# Patient Record
Sex: Male | Born: 1935 | Race: White | Hispanic: No | Marital: Married | Smoking: Former smoker
Health system: Southern US, Community
[De-identification: ages and names within clinical notes are randomized; demographics above are authoritative.]

## PROBLEM LIST (undated history)

## (undated) DIAGNOSIS — S61219A Laceration without foreign body of unspecified finger without damage to nail, initial encounter: Secondary | ICD-10-CM

## (undated) DIAGNOSIS — I2510 Atherosclerotic heart disease of native coronary artery without angina pectoris: Secondary | ICD-10-CM

## (undated) DIAGNOSIS — E041 Nontoxic single thyroid nodule: Secondary | ICD-10-CM

## (undated) DIAGNOSIS — D693 Immune thrombocytopenic purpura: Secondary | ICD-10-CM

## (undated) DIAGNOSIS — K5790 Diverticulosis of intestine, part unspecified, without perforation or abscess without bleeding: Secondary | ICD-10-CM

## (undated) DIAGNOSIS — J90 Pleural effusion, not elsewhere classified: Secondary | ICD-10-CM

## (undated) DIAGNOSIS — I509 Heart failure, unspecified: Secondary | ICD-10-CM

## (undated) DIAGNOSIS — R0600 Dyspnea, unspecified: Secondary | ICD-10-CM

## (undated) DIAGNOSIS — D696 Thrombocytopenia, unspecified: Secondary | ICD-10-CM

## (undated) DIAGNOSIS — D126 Benign neoplasm of colon, unspecified: Secondary | ICD-10-CM

## (undated) DIAGNOSIS — D509 Iron deficiency anemia, unspecified: Principal | ICD-10-CM

## (undated) DIAGNOSIS — D539 Nutritional anemia, unspecified: Secondary | ICD-10-CM

## (undated) DIAGNOSIS — I82409 Acute embolism and thrombosis of unspecified deep veins of unspecified lower extremity: Secondary | ICD-10-CM

## (undated) DIAGNOSIS — R509 Fever, unspecified: Secondary | ICD-10-CM

## (undated) DIAGNOSIS — I739 Peripheral vascular disease, unspecified: Secondary | ICD-10-CM

## (undated) DIAGNOSIS — R05 Cough: Principal | ICD-10-CM

## (undated) DIAGNOSIS — E119 Type 2 diabetes mellitus without complications: Secondary | ICD-10-CM

## (undated) DIAGNOSIS — C61 Malignant neoplasm of prostate: Secondary | ICD-10-CM

## (undated) DIAGNOSIS — N289 Disorder of kidney and ureter, unspecified: Secondary | ICD-10-CM

## (undated) DIAGNOSIS — E785 Hyperlipidemia, unspecified: Secondary | ICD-10-CM

## (undated) DIAGNOSIS — C8590 Non-Hodgkin lymphoma, unspecified, unspecified site: Secondary | ICD-10-CM

## (undated) DIAGNOSIS — D72821 Monocytosis (symptomatic): Secondary | ICD-10-CM

## (undated) DIAGNOSIS — I10 Essential (primary) hypertension: Secondary | ICD-10-CM

## (undated) DIAGNOSIS — I639 Cerebral infarction, unspecified: Secondary | ICD-10-CM

## (undated) HISTORY — DX: Benign neoplasm of colon, unspecified: D12.6

## (undated) HISTORY — DX: Non-Hodgkin lymphoma, unspecified, unspecified site: C85.90

## (undated) HISTORY — DX: Peripheral vascular disease, unspecified: I73.9

## (undated) HISTORY — DX: Monocytosis (symptomatic): D72.821

## (undated) HISTORY — PX: CORONARY ANGIOPLASTY: SHX604

## (undated) HISTORY — DX: Nutritional anemia, unspecified: D53.9

## (undated) HISTORY — DX: Pleural effusion, not elsewhere classified: J90

## (undated) HISTORY — PX: CYSTOURETHROSCOPY: SHX476

## (undated) HISTORY — DX: Acute embolism and thrombosis of unspecified deep veins of unspecified lower extremity: I82.409

## (undated) HISTORY — DX: Cough: R05

## (undated) HISTORY — DX: Iron deficiency anemia, unspecified: D50.9

## (undated) HISTORY — DX: Diverticulosis of intestine, part unspecified, without perforation or abscess without bleeding: K57.90

## (undated) HISTORY — DX: Nontoxic single thyroid nodule: E04.1

## (undated) HISTORY — PX: EXPLORATORY LAPAROTOMY: SUR591

## (undated) HISTORY — DX: Essential (primary) hypertension: I10

## (undated) HISTORY — DX: Atherosclerotic heart disease of native coronary artery without angina pectoris: I25.10

## (undated) HISTORY — PX: APPENDECTOMY: SHX54

## (undated) HISTORY — DX: Cerebral infarction, unspecified: I63.9

## (undated) HISTORY — DX: Hyperlipidemia, unspecified: E78.5

## (undated) HISTORY — PX: CATARACT EXTRACTION: SUR2

## (undated) HISTORY — PX: CARDIAC CATHETERIZATION: SHX172

## (undated) HISTORY — DX: Thrombocytopenia, unspecified: D69.6

## (undated) HISTORY — DX: Type 2 diabetes mellitus without complications: E11.9

## (undated) HISTORY — DX: Malignant neoplasm of prostate: C61

## (undated) HISTORY — DX: Disorder of kidney and ureter, unspecified: N28.9

## (undated) HISTORY — PX: COLONOSCOPY: SHX174

---

## 1998-10-05 ENCOUNTER — Emergency Department (HOSPITAL_COMMUNITY): Admission: EM | Admit: 1998-10-05 | Discharge: 1998-10-05 | Payer: Self-pay | Admitting: Emergency Medicine

## 1998-10-06 ENCOUNTER — Encounter: Payer: Self-pay | Admitting: Emergency Medicine

## 1998-11-12 ENCOUNTER — Ambulatory Visit (HOSPITAL_COMMUNITY): Admission: RE | Admit: 1998-11-12 | Discharge: 1998-11-12 | Payer: Self-pay | Admitting: Cardiology

## 1998-12-02 ENCOUNTER — Encounter: Payer: Self-pay | Admitting: Cardiology

## 1998-12-02 ENCOUNTER — Ambulatory Visit (HOSPITAL_COMMUNITY): Admission: RE | Admit: 1998-12-02 | Discharge: 1998-12-02 | Payer: Self-pay | Admitting: Cardiology

## 1998-12-08 ENCOUNTER — Ambulatory Visit (HOSPITAL_COMMUNITY): Admission: RE | Admit: 1998-12-08 | Discharge: 1998-12-08 | Payer: Self-pay | Admitting: Surgery

## 1998-12-08 ENCOUNTER — Encounter: Payer: Self-pay | Admitting: Surgery

## 1999-01-12 ENCOUNTER — Inpatient Hospital Stay (HOSPITAL_COMMUNITY): Admission: RE | Admit: 1999-01-12 | Discharge: 1999-01-20 | Payer: Self-pay | Admitting: Surgery

## 1999-02-02 ENCOUNTER — Ambulatory Visit (HOSPITAL_COMMUNITY): Admission: RE | Admit: 1999-02-02 | Discharge: 1999-02-02 | Payer: Self-pay | Admitting: Oncology

## 1999-02-03 ENCOUNTER — Encounter: Payer: Self-pay | Admitting: Oncology

## 1999-02-03 ENCOUNTER — Ambulatory Visit (HOSPITAL_COMMUNITY): Admission: RE | Admit: 1999-02-03 | Discharge: 1999-02-03 | Payer: Self-pay | Admitting: Oncology

## 1999-03-11 ENCOUNTER — Encounter: Payer: Self-pay | Admitting: Oncology

## 1999-03-11 ENCOUNTER — Inpatient Hospital Stay (HOSPITAL_COMMUNITY): Admission: EM | Admit: 1999-03-11 | Discharge: 1999-03-15 | Payer: Self-pay | Admitting: Emergency Medicine

## 1999-04-01 ENCOUNTER — Encounter: Admission: RE | Admit: 1999-04-01 | Discharge: 1999-04-01 | Payer: Self-pay | Admitting: Oncology

## 1999-06-19 ENCOUNTER — Ambulatory Visit (HOSPITAL_COMMUNITY): Admission: RE | Admit: 1999-06-19 | Discharge: 1999-06-19 | Payer: Self-pay | Admitting: Oncology

## 1999-06-21 ENCOUNTER — Ambulatory Visit (HOSPITAL_COMMUNITY): Admission: RE | Admit: 1999-06-21 | Discharge: 1999-06-21 | Payer: Self-pay | Admitting: Oncology

## 1999-06-25 ENCOUNTER — Encounter: Payer: Self-pay | Admitting: Oncology

## 1999-06-25 ENCOUNTER — Encounter: Admission: RE | Admit: 1999-06-25 | Discharge: 1999-06-25 | Payer: Self-pay | Admitting: Oncology

## 1999-09-08 ENCOUNTER — Encounter: Admission: RE | Admit: 1999-09-08 | Discharge: 1999-09-08 | Payer: Self-pay | Admitting: Oncology

## 1999-09-08 ENCOUNTER — Encounter: Payer: Self-pay | Admitting: Oncology

## 1999-12-08 ENCOUNTER — Encounter: Payer: Self-pay | Admitting: Oncology

## 1999-12-08 ENCOUNTER — Encounter: Admission: RE | Admit: 1999-12-08 | Discharge: 1999-12-08 | Payer: Self-pay | Admitting: Oncology

## 1999-12-24 ENCOUNTER — Encounter: Admission: RE | Admit: 1999-12-24 | Discharge: 2000-01-01 | Payer: Self-pay | Admitting: Oncology

## 1999-12-24 ENCOUNTER — Ambulatory Visit (HOSPITAL_COMMUNITY): Admission: RE | Admit: 1999-12-24 | Discharge: 1999-12-24 | Payer: Self-pay | Admitting: Oncology

## 2000-03-15 ENCOUNTER — Encounter: Admission: RE | Admit: 2000-03-15 | Discharge: 2000-03-15 | Payer: Self-pay | Admitting: Oncology

## 2000-03-15 ENCOUNTER — Encounter: Payer: Self-pay | Admitting: Oncology

## 2000-05-06 ENCOUNTER — Encounter: Payer: Self-pay | Admitting: Oncology

## 2000-05-06 ENCOUNTER — Encounter: Admission: RE | Admit: 2000-05-06 | Discharge: 2000-05-06 | Payer: Self-pay | Admitting: Oncology

## 2000-08-24 ENCOUNTER — Encounter: Admission: RE | Admit: 2000-08-24 | Discharge: 2000-08-24 | Payer: Self-pay | Admitting: Oncology

## 2000-08-24 ENCOUNTER — Encounter: Payer: Self-pay | Admitting: Oncology

## 2001-01-11 ENCOUNTER — Encounter: Payer: Self-pay | Admitting: Oncology

## 2001-01-11 ENCOUNTER — Encounter: Admission: RE | Admit: 2001-01-11 | Discharge: 2001-01-11 | Payer: Self-pay | Admitting: Oncology

## 2001-07-13 ENCOUNTER — Encounter: Payer: Self-pay | Admitting: Oncology

## 2001-07-13 ENCOUNTER — Encounter: Admission: RE | Admit: 2001-07-13 | Discharge: 2001-07-13 | Payer: Self-pay | Admitting: Oncology

## 2002-01-19 ENCOUNTER — Encounter: Payer: Self-pay | Admitting: Oncology

## 2002-01-19 ENCOUNTER — Encounter: Admission: RE | Admit: 2002-01-19 | Discharge: 2002-01-19 | Payer: Self-pay | Admitting: *Deleted

## 2002-07-13 ENCOUNTER — Encounter: Admission: RE | Admit: 2002-07-13 | Discharge: 2002-07-13 | Payer: Self-pay | Admitting: Oncology

## 2002-07-13 ENCOUNTER — Encounter: Payer: Self-pay | Admitting: Oncology

## 2002-07-17 ENCOUNTER — Ambulatory Visit (HOSPITAL_BASED_OUTPATIENT_CLINIC_OR_DEPARTMENT_OTHER): Admission: RE | Admit: 2002-07-17 | Discharge: 2002-07-17 | Payer: Self-pay | Admitting: Orthopedic Surgery

## 2002-07-17 ENCOUNTER — Encounter (INDEPENDENT_AMBULATORY_CARE_PROVIDER_SITE_OTHER): Payer: Self-pay | Admitting: *Deleted

## 2003-01-15 ENCOUNTER — Encounter: Payer: Self-pay | Admitting: Oncology

## 2003-01-15 ENCOUNTER — Encounter: Admission: RE | Admit: 2003-01-15 | Discharge: 2003-01-15 | Payer: Self-pay | Admitting: Oncology

## 2003-02-15 ENCOUNTER — Ambulatory Visit (HOSPITAL_COMMUNITY): Admission: RE | Admit: 2003-02-15 | Discharge: 2003-02-15 | Payer: Self-pay | Admitting: Cardiology

## 2003-03-29 ENCOUNTER — Ambulatory Visit (HOSPITAL_COMMUNITY): Admission: RE | Admit: 2003-03-29 | Discharge: 2003-03-30 | Payer: Self-pay | Admitting: Cardiology

## 2003-03-29 ENCOUNTER — Encounter: Payer: Self-pay | Admitting: Cardiology

## 2003-06-22 LAB — HM COLONOSCOPY

## 2003-07-17 ENCOUNTER — Encounter: Admission: RE | Admit: 2003-07-17 | Discharge: 2003-07-17 | Payer: Self-pay | Admitting: Oncology

## 2003-11-27 ENCOUNTER — Encounter: Admission: RE | Admit: 2003-11-27 | Discharge: 2003-11-27 | Payer: Self-pay | Admitting: Family Medicine

## 2005-01-14 ENCOUNTER — Ambulatory Visit: Payer: Self-pay | Admitting: Cardiology

## 2005-01-22 ENCOUNTER — Ambulatory Visit: Payer: Self-pay | Admitting: Oncology

## 2005-01-25 ENCOUNTER — Ambulatory Visit (HOSPITAL_COMMUNITY): Admission: RE | Admit: 2005-01-25 | Discharge: 2005-01-25 | Payer: Self-pay | Admitting: Oncology

## 2005-10-06 ENCOUNTER — Ambulatory Visit (HOSPITAL_COMMUNITY): Admission: RE | Admit: 2005-10-06 | Discharge: 2005-10-06 | Payer: Self-pay | Admitting: Urology

## 2005-10-19 ENCOUNTER — Ambulatory Visit: Admission: RE | Admit: 2005-10-19 | Discharge: 2006-01-17 | Payer: Self-pay | Admitting: Radiation Oncology

## 2005-10-28 ENCOUNTER — Encounter: Admission: RE | Admit: 2005-10-28 | Discharge: 2005-10-28 | Payer: Self-pay | Admitting: Urology

## 2005-12-15 ENCOUNTER — Ambulatory Visit (HOSPITAL_BASED_OUTPATIENT_CLINIC_OR_DEPARTMENT_OTHER): Admission: RE | Admit: 2005-12-15 | Discharge: 2005-12-15 | Payer: Self-pay | Admitting: Urology

## 2006-01-26 ENCOUNTER — Ambulatory Visit: Payer: Self-pay | Admitting: Oncology

## 2006-01-28 LAB — COMPREHENSIVE METABOLIC PANEL
Albumin: 4.1 g/dL (ref 3.5–5.2)
BUN: 22 mg/dL (ref 6–23)
CO2: 22 mEq/L (ref 19–32)
Glucose, Bld: 88 mg/dL (ref 70–99)
Sodium: 140 mEq/L (ref 135–145)
Total Bilirubin: 0.4 mg/dL (ref 0.3–1.2)
Total Protein: 6.4 g/dL (ref 6.0–8.3)

## 2006-01-28 LAB — CBC WITH DIFFERENTIAL/PLATELET
Basophils Absolute: 0 10*3/uL (ref 0.0–0.1)
EOS%: 1.3 % (ref 0.0–7.0)
Eosinophils Absolute: 0.1 10*3/uL (ref 0.0–0.5)
HGB: 13.4 g/dL (ref 13.0–17.1)
LYMPH%: 34.7 % (ref 14.0–48.0)
MCH: 32.7 pg (ref 28.0–33.4)
MCV: 94.9 fL (ref 81.6–98.0)
MONO%: 8.3 % (ref 0.0–13.0)
NEUT#: 4.1 10*3/uL (ref 1.5–6.5)
Platelets: 134 10*3/uL — ABNORMAL LOW (ref 145–400)
RBC: 4.09 10*6/uL — ABNORMAL LOW (ref 4.20–5.71)

## 2006-01-28 LAB — LACTATE DEHYDROGENASE: LDH: 189 U/L (ref 94–250)

## 2006-07-07 ENCOUNTER — Ambulatory Visit: Payer: Self-pay | Admitting: Cardiology

## 2007-01-25 ENCOUNTER — Ambulatory Visit: Payer: Self-pay | Admitting: Oncology

## 2007-01-27 ENCOUNTER — Ambulatory Visit (HOSPITAL_COMMUNITY): Admission: RE | Admit: 2007-01-27 | Discharge: 2007-01-27 | Payer: Self-pay | Admitting: Oncology

## 2007-01-27 LAB — COMPREHENSIVE METABOLIC PANEL
ALT: 13 U/L (ref 0–53)
BUN: 20 mg/dL (ref 6–23)
CO2: 24 mEq/L (ref 19–32)
Calcium: 8.8 mg/dL (ref 8.4–10.5)
Chloride: 104 mEq/L (ref 96–112)
Creatinine, Ser: 1.7 mg/dL — ABNORMAL HIGH (ref 0.40–1.50)
Glucose, Bld: 178 mg/dL — ABNORMAL HIGH (ref 70–99)

## 2007-01-27 LAB — MORPHOLOGY
PLT EST: ADEQUATE
RBC Comments: NORMAL

## 2007-01-27 LAB — CBC WITH DIFFERENTIAL/PLATELET
BASO%: 1 % (ref 0.0–2.0)
HCT: 36.8 % — ABNORMAL LOW (ref 38.7–49.9)
MCHC: 35.2 g/dL (ref 32.0–35.9)
MONO#: 0.6 10*3/uL (ref 0.1–0.9)
NEUT#: 3.5 10*3/uL (ref 1.5–6.5)
RBC: 3.85 10*6/uL — ABNORMAL LOW (ref 4.20–5.71)
WBC: 6.7 10*3/uL (ref 4.0–10.0)
lymph#: 2.4 10*3/uL (ref 0.9–3.3)

## 2007-01-27 LAB — PSA: PSA: 0.84 ng/mL (ref 0.10–4.00)

## 2007-01-27 LAB — LACTATE DEHYDROGENASE: LDH: 179 U/L (ref 94–250)

## 2008-01-25 ENCOUNTER — Ambulatory Visit: Payer: Self-pay | Admitting: Oncology

## 2008-01-29 ENCOUNTER — Ambulatory Visit (HOSPITAL_COMMUNITY): Admission: RE | Admit: 2008-01-29 | Discharge: 2008-01-29 | Payer: Self-pay | Admitting: Oncology

## 2008-01-29 LAB — MORPHOLOGY: PLT EST: DECREASED

## 2008-01-29 LAB — CBC WITH DIFFERENTIAL/PLATELET
Basophils Absolute: 0 10*3/uL (ref 0.0–0.1)
EOS%: 1.7 % (ref 0.0–7.0)
Eosinophils Absolute: 0.1 10*3/uL (ref 0.0–0.5)
HCT: 37.8 % — ABNORMAL LOW (ref 38.7–49.9)
HGB: 13 g/dL (ref 13.0–17.1)
LYMPH%: 34.8 % (ref 14.0–48.0)
MCH: 34.2 pg — ABNORMAL HIGH (ref 28.0–33.4)
MCV: 99.4 fL — ABNORMAL HIGH (ref 81.6–98.0)
MONO%: 10.8 % (ref 0.0–13.0)
NEUT#: 3.3 10*3/uL (ref 1.5–6.5)
NEUT%: 52.3 % (ref 40.0–75.0)
Platelets: 98 10*3/uL — ABNORMAL LOW (ref 145–400)

## 2008-01-30 LAB — COMPREHENSIVE METABOLIC PANEL
ALT: 12 U/L (ref 0–53)
AST: 15 U/L (ref 0–37)
CO2: 26 mEq/L (ref 19–32)
Calcium: 8.9 mg/dL (ref 8.4–10.5)
Chloride: 104 mEq/L (ref 96–112)
Creatinine, Ser: 1.88 mg/dL — ABNORMAL HIGH (ref 0.40–1.50)
Sodium: 140 mEq/L (ref 135–145)
Total Protein: 6.3 g/dL (ref 6.0–8.3)

## 2008-01-30 LAB — LACTATE DEHYDROGENASE: LDH: 188 U/L (ref 94–250)

## 2008-03-07 ENCOUNTER — Ambulatory Visit: Payer: Self-pay | Admitting: Cardiology

## 2008-03-11 ENCOUNTER — Ambulatory Visit: Payer: Self-pay | Admitting: Oncology

## 2008-03-12 ENCOUNTER — Ambulatory Visit: Payer: Self-pay

## 2008-03-13 LAB — CBC WITH DIFFERENTIAL/PLATELET
BASO%: 0.8 % (ref 0.0–2.0)
EOS%: 1.9 % (ref 0.0–7.0)
LYMPH%: 36.6 % (ref 14.0–48.0)
MCHC: 34.1 g/dL (ref 32.0–35.9)
MCV: 100.9 fL — ABNORMAL HIGH (ref 81.6–98.0)
MONO%: 16.5 % — ABNORMAL HIGH (ref 0.0–13.0)
Platelets: 99 10*3/uL — ABNORMAL LOW (ref 145–400)
RBC: 3.68 10*6/uL — ABNORMAL LOW (ref 4.20–5.71)
RDW: 16.9 % — ABNORMAL HIGH (ref 11.2–14.6)

## 2008-03-13 LAB — MORPHOLOGY

## 2008-04-09 LAB — CBC WITH DIFFERENTIAL/PLATELET
BASO%: 3.8 % — ABNORMAL HIGH (ref 0.0–2.0)
Eosinophils Absolute: 0.1 10*3/uL (ref 0.0–0.5)
HCT: 37.7 % — ABNORMAL LOW (ref 38.7–49.9)
MCHC: 34.3 g/dL (ref 32.0–35.9)
MONO#: 0.7 10*3/uL (ref 0.1–0.9)
NEUT#: 2.4 10*3/uL (ref 1.5–6.5)
NEUT%: 40.4 % (ref 40.0–75.0)
Platelets: 103 10*3/uL — ABNORMAL LOW (ref 145–400)
WBC: 6 10*3/uL (ref 4.0–10.0)
lymph#: 2.5 10*3/uL (ref 0.9–3.3)

## 2008-04-09 LAB — MORPHOLOGY

## 2008-05-03 ENCOUNTER — Ambulatory Visit: Payer: Self-pay | Admitting: Oncology

## 2008-05-07 LAB — CBC WITH DIFFERENTIAL/PLATELET
Eosinophils Absolute: 0.1 10*3/uL (ref 0.0–0.5)
HCT: 40.3 % (ref 38.7–49.9)
LYMPH%: 39.9 % (ref 14.0–48.0)
MCV: 101 fL — ABNORMAL HIGH (ref 81.6–98.0)
MONO#: 1 10*3/uL — ABNORMAL HIGH (ref 0.1–0.9)
MONO%: 14.4 % — ABNORMAL HIGH (ref 0.0–13.0)
NEUT#: 3 10*3/uL (ref 1.5–6.5)
NEUT%: 41.8 % (ref 40.0–75.0)
Platelets: 99 10*3/uL — ABNORMAL LOW (ref 145–400)
WBC: 7.1 10*3/uL (ref 4.0–10.0)

## 2008-05-07 LAB — MORPHOLOGY: PLT EST: DECREASED

## 2008-06-03 LAB — CBC WITH DIFFERENTIAL/PLATELET
BASO%: 1 % (ref 0.0–2.0)
Basophils Absolute: 0.1 10*3/uL (ref 0.0–0.1)
EOS%: 1.7 % (ref 0.0–7.0)
HGB: 14 g/dL (ref 13.0–17.1)
MCH: 34.4 pg — ABNORMAL HIGH (ref 28.0–33.4)
MCHC: 33.9 g/dL (ref 32.0–35.9)
MCV: 101.6 fL — ABNORMAL HIGH (ref 81.6–98.0)
MONO%: 14 % — ABNORMAL HIGH (ref 0.0–13.0)
NEUT%: 53.6 % (ref 40.0–75.0)
RDW: 17 % — ABNORMAL HIGH (ref 11.2–14.6)
lymph#: 2.7 10*3/uL (ref 0.9–3.3)

## 2008-06-03 LAB — MORPHOLOGY

## 2008-06-28 ENCOUNTER — Ambulatory Visit: Payer: Self-pay | Admitting: Oncology

## 2008-07-02 LAB — CBC WITH DIFFERENTIAL/PLATELET
BASO%: 0.9 % (ref 0.0–2.0)
EOS%: 2.2 % (ref 0.0–7.0)
MCH: 34.5 pg — ABNORMAL HIGH (ref 28.0–33.4)
MCHC: 34.3 g/dL (ref 32.0–35.9)
MONO#: 1 10*3/uL — ABNORMAL HIGH (ref 0.1–0.9)
RBC: 4.12 10*6/uL — ABNORMAL LOW (ref 4.20–5.71)
RDW: 17.2 % — ABNORMAL HIGH (ref 11.2–14.6)
WBC: 7.1 10*3/uL (ref 4.0–10.0)
lymph#: 2.7 10*3/uL (ref 0.9–3.3)

## 2008-07-02 LAB — MORPHOLOGY: PLT EST: DECREASED

## 2008-07-30 LAB — CBC WITH DIFFERENTIAL/PLATELET
BASO%: 1.2 % (ref 0.0–2.0)
Eosinophils Absolute: 0.1 10*3/uL (ref 0.0–0.5)
HCT: 41.4 % (ref 38.7–49.9)
HGB: 14 g/dL (ref 13.0–17.1)
LYMPH%: 39.5 % (ref 14.0–48.0)
MCHC: 33.9 g/dL (ref 32.0–35.9)
MONO#: 0.9 10*3/uL (ref 0.1–0.9)
NEUT#: 3 10*3/uL (ref 1.5–6.5)
NEUT%: 44.2 % (ref 40.0–75.0)
Platelets: 103 10*3/uL — ABNORMAL LOW (ref 145–400)
WBC: 6.9 10*3/uL (ref 4.0–10.0)
lymph#: 2.7 10*3/uL (ref 0.9–3.3)

## 2008-07-30 LAB — MORPHOLOGY: PLT EST: DECREASED

## 2008-08-23 ENCOUNTER — Ambulatory Visit: Payer: Self-pay | Admitting: Oncology

## 2008-09-24 LAB — CBC WITH DIFFERENTIAL/PLATELET
Basophils Absolute: 0.1 10*3/uL (ref 0.0–0.1)
EOS%: 1.8 % (ref 0.0–7.0)
Eosinophils Absolute: 0.1 10*3/uL (ref 0.0–0.5)
HCT: 39.2 % (ref 38.4–49.9)
HGB: 13.3 g/dL (ref 13.0–17.1)
LYMPH%: 37.7 % (ref 14.0–49.0)
MCH: 34.4 pg — ABNORMAL HIGH (ref 27.2–33.4)
MCV: 101.2 fL — ABNORMAL HIGH (ref 79.3–98.0)
MONO%: 12.7 % (ref 0.0–14.0)
NEUT#: 2.9 10*3/uL (ref 1.5–6.5)
NEUT%: 46.8 % (ref 39.0–75.0)
Platelets: 113 10*3/uL — ABNORMAL LOW (ref 140–400)
RDW: 17 % — ABNORMAL HIGH (ref 11.0–14.6)

## 2008-09-24 LAB — MORPHOLOGY: PLT EST: DECREASED

## 2008-11-14 ENCOUNTER — Ambulatory Visit: Payer: Self-pay | Admitting: Oncology

## 2008-11-19 LAB — CBC WITH DIFFERENTIAL/PLATELET
Eosinophils Absolute: 0.1 10*3/uL (ref 0.0–0.5)
HCT: 39.5 % (ref 38.4–49.9)
LYMPH%: 36.9 % (ref 14.0–49.0)
MONO#: 0.9 10*3/uL (ref 0.1–0.9)
NEUT#: 2.8 10*3/uL (ref 1.5–6.5)
Platelets: 105 10*3/uL — ABNORMAL LOW (ref 140–400)
RBC: 3.89 10*6/uL — ABNORMAL LOW (ref 4.20–5.82)
WBC: 6.2 10*3/uL (ref 4.0–10.3)
lymph#: 2.3 10*3/uL (ref 0.9–3.3)

## 2008-11-19 LAB — MORPHOLOGY: PLT EST: DECREASED

## 2008-12-17 ENCOUNTER — Ambulatory Visit: Payer: Self-pay | Admitting: Oncology

## 2008-12-17 LAB — CBC WITH DIFFERENTIAL/PLATELET
Basophils Absolute: 0 10*3/uL (ref 0.0–0.1)
EOS%: 1.5 % (ref 0.0–7.0)
Eosinophils Absolute: 0.1 10*3/uL (ref 0.0–0.5)
HGB: 13.4 g/dL (ref 13.0–17.1)
LYMPH%: 35.9 % (ref 14.0–49.0)
MCH: 35 pg — ABNORMAL HIGH (ref 27.2–33.4)
MCV: 101.4 fL — ABNORMAL HIGH (ref 79.3–98.0)
MONO%: 11.8 % (ref 0.0–14.0)
Platelets: 97 10*3/uL — ABNORMAL LOW (ref 140–400)
RBC: 3.84 10*6/uL — ABNORMAL LOW (ref 4.20–5.82)
RDW: 16.8 % — ABNORMAL HIGH (ref 11.0–14.6)

## 2008-12-17 LAB — MORPHOLOGY

## 2009-01-09 ENCOUNTER — Ambulatory Visit: Payer: Self-pay | Admitting: Oncology

## 2009-01-14 LAB — CBC WITH DIFFERENTIAL/PLATELET
Basophils Absolute: 0 10*3/uL (ref 0.0–0.1)
EOS%: 1.6 % (ref 0.0–7.0)
Eosinophils Absolute: 0.1 10*3/uL (ref 0.0–0.5)
HGB: 13.8 g/dL (ref 13.0–17.1)
NEUT#: 4 10*3/uL (ref 1.5–6.5)
RDW: 15.7 % — ABNORMAL HIGH (ref 11.0–14.6)
lymph#: 2.5 10*3/uL (ref 0.9–3.3)

## 2009-01-14 LAB — MORPHOLOGY: PLT EST: DECREASED

## 2009-02-04 ENCOUNTER — Ambulatory Visit (HOSPITAL_COMMUNITY): Admission: RE | Admit: 2009-02-04 | Discharge: 2009-02-04 | Payer: Self-pay | Admitting: Oncology

## 2009-02-04 LAB — COMPREHENSIVE METABOLIC PANEL
AST: 17 U/L (ref 0–37)
Albumin: 4.1 g/dL (ref 3.5–5.2)
Alkaline Phosphatase: 66 U/L (ref 39–117)
Potassium: 4.1 mEq/L (ref 3.5–5.3)
Sodium: 139 mEq/L (ref 135–145)
Total Protein: 6.6 g/dL (ref 6.0–8.3)

## 2009-02-04 LAB — MORPHOLOGY: PLT EST: DECREASED

## 2009-02-04 LAB — CBC WITH DIFFERENTIAL/PLATELET
Eosinophils Absolute: 0.1 10*3/uL (ref 0.0–0.5)
MONO#: 0.7 10*3/uL (ref 0.1–0.9)
NEUT#: 2.7 10*3/uL (ref 1.5–6.5)
RBC: 4.06 10*6/uL — ABNORMAL LOW (ref 4.20–5.82)
RDW: 15.6 % — ABNORMAL HIGH (ref 11.0–14.6)
WBC: 5.9 10*3/uL (ref 4.0–10.3)

## 2009-02-06 ENCOUNTER — Ambulatory Visit: Payer: Self-pay | Admitting: Oncology

## 2009-02-26 ENCOUNTER — Encounter (INDEPENDENT_AMBULATORY_CARE_PROVIDER_SITE_OTHER): Payer: Self-pay | Admitting: *Deleted

## 2009-04-02 ENCOUNTER — Ambulatory Visit: Payer: Self-pay | Admitting: Oncology

## 2009-04-07 LAB — MORPHOLOGY

## 2009-04-07 LAB — CBC WITH DIFFERENTIAL/PLATELET
EOS%: 1.7 % (ref 0.0–7.0)
Eosinophils Absolute: 0.1 10*3/uL (ref 0.0–0.5)
MCV: 102.2 fL — ABNORMAL HIGH (ref 79.3–98.0)
MONO%: 14.6 % — ABNORMAL HIGH (ref 0.0–14.0)
NEUT#: 2.4 10*3/uL (ref 1.5–6.5)
RBC: 3.66 10*6/uL — ABNORMAL LOW (ref 4.20–5.82)
RDW: 17.2 % — ABNORMAL HIGH (ref 11.0–14.6)

## 2009-05-29 ENCOUNTER — Ambulatory Visit: Payer: Self-pay | Admitting: Oncology

## 2009-06-02 LAB — CBC WITH DIFFERENTIAL/PLATELET
BASO%: 2.2 % — ABNORMAL HIGH (ref 0.0–2.0)
LYMPH%: 38.3 % (ref 14.0–49.0)
MCHC: 33.9 g/dL (ref 32.0–36.0)
MONO#: 1 10*3/uL — ABNORMAL HIGH (ref 0.1–0.9)
Platelets: 93 10*3/uL — ABNORMAL LOW (ref 140–400)
RBC: 4.15 10*6/uL — ABNORMAL LOW (ref 4.20–5.82)
WBC: 7.1 10*3/uL (ref 4.0–10.3)
lymph#: 2.7 10*3/uL (ref 0.9–3.3)

## 2009-06-02 LAB — MORPHOLOGY

## 2009-07-31 ENCOUNTER — Ambulatory Visit: Payer: Self-pay | Admitting: Oncology

## 2009-08-04 LAB — CBC WITH DIFFERENTIAL/PLATELET
BASO%: 0.6 % (ref 0.0–2.0)
Basophils Absolute: 0 10*3/uL (ref 0.0–0.1)
Eosinophils Absolute: 0.2 10*3/uL (ref 0.0–0.5)
HGB: 14.1 g/dL (ref 13.0–17.1)
LYMPH%: 35.7 % (ref 14.0–49.0)
MCH: 35.1 pg — ABNORMAL HIGH (ref 27.2–33.4)
MONO%: 15 % — ABNORMAL HIGH (ref 0.0–14.0)
NEUT#: 3 10*3/uL (ref 1.5–6.5)
NEUT%: 46.2 % (ref 39.0–75.0)
Platelets: 108 10*3/uL — ABNORMAL LOW (ref 140–400)

## 2009-08-04 LAB — MORPHOLOGY: PLT EST: DECREASED

## 2009-08-07 ENCOUNTER — Telehealth: Payer: Self-pay | Admitting: Internal Medicine

## 2009-10-07 ENCOUNTER — Ambulatory Visit: Payer: Self-pay | Admitting: Oncology

## 2009-10-09 ENCOUNTER — Encounter: Payer: Self-pay | Admitting: Cardiology

## 2009-10-09 LAB — CBC WITH DIFFERENTIAL/PLATELET
BASO%: 0.3 % (ref 0.0–2.0)
Basophils Absolute: 0 10*3/uL (ref 0.0–0.1)
HCT: 40.8 % (ref 38.4–49.9)
HGB: 13.7 g/dL (ref 13.0–17.1)
MCHC: 33.7 g/dL (ref 32.0–36.0)
MCV: 105.1 fL — ABNORMAL HIGH (ref 79.3–98.0)
Platelets: 97 10*3/uL — ABNORMAL LOW (ref 140–400)
RDW: 17.6 % — ABNORMAL HIGH (ref 11.0–14.6)
lymph#: 2.3 10*3/uL (ref 0.9–3.3)

## 2009-10-09 LAB — PSA: PSA: 0.04 ng/mL — ABNORMAL LOW (ref 0.10–4.00)

## 2009-10-09 LAB — MORPHOLOGY

## 2009-12-05 ENCOUNTER — Ambulatory Visit: Payer: Self-pay | Admitting: Oncology

## 2009-12-09 LAB — PSA: PSA: 0.03 ng/mL — ABNORMAL LOW (ref 0.10–4.00)

## 2009-12-09 LAB — MORPHOLOGY

## 2009-12-09 LAB — CBC WITH DIFFERENTIAL/PLATELET
HGB: 13.7 g/dL (ref 13.0–17.1)
MCH: 35.3 pg — ABNORMAL HIGH (ref 27.2–33.4)
MCHC: 33.9 g/dL (ref 32.0–36.0)
MONO%: 12.3 % (ref 0.0–14.0)
NEUT#: 2.9 10*3/uL (ref 1.5–6.5)
RBC: 3.89 10*6/uL — ABNORMAL LOW (ref 4.20–5.82)
RDW: 16.8 % — ABNORMAL HIGH (ref 11.0–14.6)
WBC: 5.6 10*3/uL (ref 4.0–10.3)

## 2010-02-05 ENCOUNTER — Ambulatory Visit: Payer: Self-pay | Admitting: Oncology

## 2010-02-09 LAB — CBC WITH DIFFERENTIAL/PLATELET
Eosinophils Absolute: 0.1 10*3/uL (ref 0.0–0.5)
HCT: 39.5 % (ref 38.4–49.9)
HGB: 13.5 g/dL (ref 13.0–17.1)
LYMPH%: 39.7 % (ref 14.0–49.0)
MCH: 35.6 pg — ABNORMAL HIGH (ref 27.2–33.4)
MCHC: 34.1 g/dL (ref 32.0–36.0)
MONO%: 12.4 % (ref 0.0–14.0)
NEUT#: 2.4 10*3/uL (ref 1.5–6.5)
WBC: 5.5 10*3/uL (ref 4.0–10.3)
lymph#: 2.2 10*3/uL (ref 0.9–3.3)

## 2010-02-09 LAB — MORPHOLOGY: Platelet Morphology: NORMAL

## 2010-04-08 ENCOUNTER — Ambulatory Visit: Payer: Self-pay | Admitting: Oncology

## 2010-04-10 LAB — CBC WITH DIFFERENTIAL/PLATELET
Basophils Absolute: 0 10*3/uL (ref 0.0–0.1)
HCT: 43 % (ref 38.4–49.9)
HGB: 14.5 g/dL (ref 13.0–17.1)
MCH: 34.9 pg — ABNORMAL HIGH (ref 27.2–33.4)
MCV: 103.6 fL — ABNORMAL HIGH (ref 79.3–98.0)
NEUT%: 38.8 % — ABNORMAL LOW (ref 39.0–75.0)
nRBC: 0 % (ref 0–0)

## 2010-04-10 LAB — MORPHOLOGY

## 2010-04-10 LAB — PSA: PSA: 0.03 ng/mL — ABNORMAL LOW (ref 0.10–4.00)

## 2010-06-09 ENCOUNTER — Ambulatory Visit: Payer: Self-pay | Admitting: Oncology

## 2010-06-10 LAB — CBC WITH DIFFERENTIAL/PLATELET
BASO%: 0.2 % (ref 0.0–2.0)
HCT: 41.9 % (ref 38.4–49.9)
HGB: 14.2 g/dL (ref 13.0–17.1)
LYMPH%: 44 % (ref 14.0–49.0)
MCV: 101.7 fL — ABNORMAL HIGH (ref 79.3–98.0)
MONO%: 17.7 % — ABNORMAL HIGH (ref 0.0–14.0)
NEUT#: 2.4 10*3/uL (ref 1.5–6.5)
NEUT%: 36.1 % — ABNORMAL LOW (ref 39.0–75.0)
Platelets: 106 10*3/uL — ABNORMAL LOW (ref 140–400)
WBC: 6.6 10*3/uL (ref 4.0–10.3)

## 2010-06-10 LAB — COMPREHENSIVE METABOLIC PANEL
ALT: 21 U/L (ref 0–53)
Alkaline Phosphatase: 75 U/L (ref 39–117)
CO2: 24 mEq/L (ref 19–32)
Calcium: 8.9 mg/dL (ref 8.4–10.5)
Chloride: 106 mEq/L (ref 96–112)
Glucose, Bld: 80 mg/dL (ref 70–99)
Potassium: 4.2 mEq/L (ref 3.5–5.3)
Total Bilirubin: 0.3 mg/dL (ref 0.3–1.2)

## 2010-06-10 LAB — PSA: PSA: 0.02 ng/mL (ref <=4.00)

## 2010-06-10 LAB — SEDIMENTATION RATE: Sed Rate: 7 mm/hr (ref 0–16)

## 2010-06-10 LAB — LACTATE DEHYDROGENASE: LDH: 210 U/L (ref 94–250)

## 2010-07-21 NOTE — Progress Notes (Signed)
Summary: Schedule Colonoscopy   Phone Note Outgoing Call   Summary of Call: Patient is due for a recall colonoscopy due to his previous history of hyperplastic colonic polyps and diverticulosis. I have left a message for patient to call back. Initial call taken by: Awilda Bill CMA Deborra Medina),  August 07, 2009 2:00 PM  Follow-up for Phone Call        I have left a message for the patient to call back.  Follow-up by: Awilda Bill CMA Deborra Medina),  August 12, 2009 9:54 AM  Additional Follow-up for Phone Call Additional follow up Details #1::        Patient never responded to our calls. I will send a letter.  Additional Follow-up by: Awilda Bill CMA Deborra Medina),  August 14, 2009 11:46 AM

## 2010-08-07 ENCOUNTER — Other Ambulatory Visit: Payer: Self-pay | Admitting: Oncology

## 2010-08-07 ENCOUNTER — Ambulatory Visit (HOSPITAL_COMMUNITY)
Admission: RE | Admit: 2010-08-07 | Discharge: 2010-08-07 | Disposition: A | Discharge status: Home / Self Care | Payer: Medicare Other | Source: Ambulatory Visit | Attending: Oncology | Admitting: Oncology

## 2010-08-07 DIAGNOSIS — C8590 Non-Hodgkin lymphoma, unspecified, unspecified site: Secondary | ICD-10-CM

## 2010-08-07 DIAGNOSIS — C8589 Other specified types of non-Hodgkin lymphoma, extranodal and solid organ sites: Secondary | ICD-10-CM | POA: Insufficient documentation

## 2010-08-10 ENCOUNTER — Encounter: Payer: Medicare Other | Admitting: Oncology

## 2010-08-10 ENCOUNTER — Other Ambulatory Visit: Payer: Self-pay | Admitting: Oncology

## 2010-08-10 LAB — SEDIMENTATION RATE: Sed Rate: 4 mm/hr (ref 0–16)

## 2010-08-14 ENCOUNTER — Encounter: Payer: Self-pay | Admitting: Cardiology

## 2010-08-25 DIAGNOSIS — E785 Hyperlipidemia, unspecified: Secondary | ICD-10-CM | POA: Insufficient documentation

## 2010-08-25 DIAGNOSIS — I2510 Atherosclerotic heart disease of native coronary artery without angina pectoris: Secondary | ICD-10-CM | POA: Insufficient documentation

## 2010-08-25 DIAGNOSIS — I10 Essential (primary) hypertension: Secondary | ICD-10-CM | POA: Insufficient documentation

## 2010-08-25 DIAGNOSIS — D696 Thrombocytopenia, unspecified: Secondary | ICD-10-CM | POA: Insufficient documentation

## 2010-08-25 DIAGNOSIS — Z8579 Personal history of other malignant neoplasms of lymphoid, hematopoietic and related tissues: Secondary | ICD-10-CM | POA: Insufficient documentation

## 2010-08-25 DIAGNOSIS — I739 Peripheral vascular disease, unspecified: Secondary | ICD-10-CM | POA: Insufficient documentation

## 2010-08-26 ENCOUNTER — Encounter: Payer: Self-pay | Admitting: Cardiology

## 2010-08-26 ENCOUNTER — Encounter (INDEPENDENT_AMBULATORY_CARE_PROVIDER_SITE_OTHER): Payer: Medicare Other | Admitting: Cardiology

## 2010-08-26 ENCOUNTER — Encounter: Payer: Self-pay | Admitting: *Deleted

## 2010-08-26 DIAGNOSIS — R011 Cardiac murmur, unspecified: Secondary | ICD-10-CM | POA: Insufficient documentation

## 2010-08-26 DIAGNOSIS — I10 Essential (primary) hypertension: Secondary | ICD-10-CM | POA: Insufficient documentation

## 2010-08-26 DIAGNOSIS — I2510 Atherosclerotic heart disease of native coronary artery without angina pectoris: Secondary | ICD-10-CM

## 2010-08-26 NOTE — Progress Notes (Signed)
Subjective:      Patient ID: Ricardo Watkins is a 77 y.o. male.  Chief Complaint: Murmur HPI The patient is referred for evaluation of heart murmur. He was seen here some time ago for management of coronary disease with a distant PCI. Since I last saw him he has had no new cardiovascular testing. However, he was recently noted to have a heart murmur which was not previously detected. He is active in his yard planting his garden. With this level of activity he denies any chest pressure, neck or arm discomfort. He has no palpitations, presyncope or syncope. He has had no weight gain or edema. He does fatigue more easily than he used to. However if he takes a nap he recovers.  He does have chronic lower extremity swelling which is baseline. However, he does not report PND or orthopnea. She  Past Medical History  Diagnosis Date  . PVD (peripheral vascular disease)   . CAD (coronary artery disease)     30% LAD Stenosis, 70% ramus intermedius stenosis, treated with PTCA and angioplasty by Dr Albertine Patricia 2004  . DM (diabetes mellitus)   . Dyslipidemia   . Renal insufficiency   . Thrombocytopenia   . Lymphoma   . PVD (peripheral vascular disease)   . Hypertension    Past Surgical History  Procedure Date  . Cystourethroscopy     ROBOTIC ARM NUCLETRON SEED IMPLANTATION OF PROSTATE  . Exploratory laparotomy     For evaluation of lymphoma    ROS As stated in the HPI and negative for all other systems.  Objective:   BP 182/102  Pulse 63  Ht 5\' 9"  (1.753 m)  Wt 199 lb (90.266 kg)  BMI 29.39 kg/m2   Physical Exam  Constitutional: He appears healthy. No distress.  HENT:  Mouth/Throat: Dentition is normal. Oropharynx is clear.  Eyes: Conjunctivae are normal. Pupils are equal, round, and reactive to light.  Neck: Thyroid normal. No JVD present. No adenopathy. No thyromegaly present.  Cardiovascular: Regular rhythm, S1 normal, S2 normal and intact distal pulses.   No extrasystoles are present.  PMI is not displaced.  Exam reveals no S3, no S4, no distant heart sounds, no friction rub, no midsystolic click and no opening snap.   No murmur (Early peaking heard best at the right upper sternal border.) heard. Pulses:      Carotid pulses are on the right side with bruit, and on the left side with bruit. Pulmonary/Chest: Breath sounds normal. No stridor. He has no wheezes. He has no rales. He exhibits no tenderness.  Abdominal: Bowel sounds are normal. He exhibits no distension and no mass. There is no splenomegaly or hepatomegaly. There is no tenderness. No hernia.  Musculoskeletal: He exhibits edema (Left greater than right mild lower extremity). He exhibits no tenderness and no deformity.  Neurological: He is alert and oriented to person, place, and time.  Skin: Skin is warm and dry. No cyanosis. Nails show no clubbing.     Lab Review:     Assessment:     Murmur   Plan:

## 2010-09-01 NOTE — Assessment & Plan Note (Signed)
Summary: re-est. last ov -04. dx: new mumur. per [facility] office [phone]...  Medications Added LOPRESSOR 50 MG TABS (METOPROLOL TARTRATE) one tablet every morning LOPRESSOR 100 MG TABS (METOPROLOL TARTRATE) take one tablet every evening ASPIRIN 81 MG TABS (ASPIRIN) once daily FLOMAX 0.4 MG CAPS (TAMSULOSIN HCL) take one tablet once daily NORVASC 5 MG TABS (AMLODIPINE BESYLATE) take one and one half tablet every day LIPITOR 10 MG TABS (ATORVASTATIN CALCIUM) take one half tablet qd ACTOS 45 MG TABS (PIOGLITAZONE HCL) take one tablet once daily ACCUPRIL 40 MG TABS (QUINAPRIL HCL) take one tablet once daily CATAPRES 0.1 MG TABS (CLONIDINE HCL) take one tablet two times a day FISH OIL MAXIMUM STRENGTH 1200 MG CAPS (OMEGA-3 FATTY ACIDS) 2 capsules daily TYLENOL 325 MG TABS (ACETAMINOPHEN) as needed GLIPIZIDE 5 MG TABS (GLIPIZIDE) take one tablet once daily VITAMIN D 1000 UNIT TABS (CHOLECALCIFEROL) once daily CALCIUM CARBONATE 600 MG TABS (CALCIUM CARBONATE) take 2 tablets daily      Allergies Added: NKDA  History of Present Illness: Please see the note done in the Epic system.  Current Medications (verified): 1)  Lopressor 50 Mg Tabs (Metoprolol Tartrate) .... One Tablet Every Morning 2)  Lopressor 100 Mg Tabs (Metoprolol Tartrate) .... Take One Tablet Every Evening 3)  Aspirin 81 Mg Tabs (Aspirin) .... Once Daily 4)  Flomax 0.4 Mg Caps (Tamsulosin Hcl) .... Take One Tablet Once Daily 5)  Norvasc 5 Mg Tabs (Amlodipine Besylate) .... Take One and One Half Tablet Every Day 6)  Lipitor 10 Mg Tabs (Atorvastatin Calcium) .... Take One Half Tablet Qd 7)  Actos 45 Mg Tabs (Pioglitazone Hcl) .... Take One Tablet Once Daily 8)  Accupril 40 Mg Tabs (Quinapril Hcl) .... Take One Tablet Once Daily 9)  Catapres 0.1 Mg Tabs (Clonidine Hcl) .... Take One Tablet Two Times A Day 10)  Fish Oil Maximum Strength 1200 Mg Caps (Omega-3 Fatty Acids) .... 2 Capsules Daily 11)  Tylenol 325 Mg Tabs  (Acetaminophen) .... As Needed 12)  Glipizide 5 Mg Tabs (Glipizide) .... Take One Tablet Once Daily 13)  Vitamin D 1000 Unit Tabs (Cholecalciferol) .... Once Daily 14)  Calcium Carbonate 600 Mg Tabs (Calcium Carbonate) .... Take 2 Tablets Daily  Allergies (verified): No Known Drug Allergies  Vital Signs:  Patient profile:   77 year old male Height:      69 inches Weight:      199 pounds BMI:     29.49 Pulse rate:   63 / minute Pulse rhythm:   regular BP sitting:   210 / 98  (left arm) Cuff size:   regular  Vitals Entered By: Doug Sou CMA (August 26, 2010 4:25 PM)   Other Orders: EKG w/ Interpretation (93000) Echocardiogram (Echo) Treadmill (Treadmill)  Patient Instructions: 1)  Your physician recommends that you schedule a follow-up appointment after testing 2)  Your physician recommends that you continue on your current medications as directed. Please refer to the Current Medication list given to you today. 3)  Your physician has requested that you have an echocardiogram.  Echocardiography is a painless test that uses sound waves to create images of your heart. It provides your doctor with information about the size and shape of your heart and how well your heart's chambers and valves are working.  This procedure takes approximately one hour. There are no restrictions for this procedure. 4)  Your physician has requested that you have an exercise tolerance test.   This needs to be done appr  2 weeks after your echocardiogram.  For further information please visit [email or web address].  Please also follow instruction sheet, as given.

## 2010-09-07 ENCOUNTER — Other Ambulatory Visit: Payer: Self-pay | Admitting: Cardiology

## 2010-09-07 ENCOUNTER — Ambulatory Visit (HOSPITAL_COMMUNITY): Payer: Medicare Other | Attending: Cardiology

## 2010-09-07 DIAGNOSIS — I739 Peripheral vascular disease, unspecified: Secondary | ICD-10-CM | POA: Insufficient documentation

## 2010-09-07 DIAGNOSIS — R079 Chest pain, unspecified: Secondary | ICD-10-CM | POA: Insufficient documentation

## 2010-09-07 DIAGNOSIS — I2510 Atherosclerotic heart disease of native coronary artery without angina pectoris: Secondary | ICD-10-CM | POA: Insufficient documentation

## 2010-09-07 DIAGNOSIS — N289 Disorder of kidney and ureter, unspecified: Secondary | ICD-10-CM | POA: Insufficient documentation

## 2010-09-07 DIAGNOSIS — I10 Essential (primary) hypertension: Secondary | ICD-10-CM | POA: Insufficient documentation

## 2010-09-07 DIAGNOSIS — E119 Type 2 diabetes mellitus without complications: Secondary | ICD-10-CM | POA: Insufficient documentation

## 2010-09-07 DIAGNOSIS — E785 Hyperlipidemia, unspecified: Secondary | ICD-10-CM | POA: Insufficient documentation

## 2010-09-07 DIAGNOSIS — D696 Thrombocytopenia, unspecified: Secondary | ICD-10-CM | POA: Insufficient documentation

## 2010-09-07 DIAGNOSIS — R072 Precordial pain: Secondary | ICD-10-CM

## 2010-09-08 NOTE — Progress Notes (Signed)
Summary: [facility] Office Note  [facility] Office Note   Imported By: Marilynne Drivers 09/03/2010 12:46:40  _____________________________________________________________________  External Attachment:    Type:   Image     Comment:   External Document

## 2010-09-10 ENCOUNTER — Encounter: Payer: Self-pay | Admitting: Physician Assistant

## 2010-09-16 ENCOUNTER — Encounter: Payer: Self-pay | Admitting: Physician Assistant

## 2010-09-21 ENCOUNTER — Ambulatory Visit (INDEPENDENT_AMBULATORY_CARE_PROVIDER_SITE_OTHER): Payer: Medicare Other | Admitting: Physician Assistant

## 2010-09-21 DIAGNOSIS — I2510 Atherosclerotic heart disease of native coronary artery without angina pectoris: Secondary | ICD-10-CM

## 2010-09-21 NOTE — Progress Notes (Signed)
Exercise Treadmill Test  Pre-Exercise Testing Evaluation Rhythm: sinus bradycardia  Rate: 56   PR: .17 QRS: .08  QT: .45 QTc: .44     Test  Exercise Tolerance Test Ordering MD: Marijo File, MD  Interpreting MD:  Marijo File, MD  Unique Test No: 1  Treadmill:  1  Indication for ETT: known ASHD  Contraindication to ETT: No   Stress Modality: exercise - treadmill  Cardiac Imaging Performed: non   Protocol: standard Bruce - maximal  Max BP: 184/82  Max MPHR (bpm): 142 120  MPHR obtained (bpm):  123 % MPHR obtained:  86  Reached 85% MPHR (min:sec):  7:00 Total Exercise Time (min-sec):  7:30  Workload in METS:  8.3 Borg Scale: 15  Reason ETT Terminated:  desired heart rate attained    ST Segment Analysis At Rest: non-specific ST segment slurring With Exercise: significant ischemic ST depression  Other Information Arrhythmia:  Yes Angina during ETT:  absent (0) Quality of ETT:  diagnostic  ETT Interpretation:  abnormal - evidence of ST depression consistent with ischemia  Comments: Fair exercise tolerance.   Normal BP response. No chest pain.   Frequent PVCs and one couplet. Inferolateral ST segment depression (1-2 mm) with maximal exercise.  Recommendations: Discussed with Dr. [name]. Will arrange GXT myoview.

## 2010-09-30 ENCOUNTER — Ambulatory Visit (HOSPITAL_COMMUNITY): Payer: Medicare Other | Attending: Cardiology | Admitting: Radiology

## 2010-09-30 VITALS — Ht 69.0 in | Wt 194.0 lb

## 2010-09-30 DIAGNOSIS — R079 Chest pain, unspecified: Secondary | ICD-10-CM | POA: Insufficient documentation

## 2010-09-30 DIAGNOSIS — I4949 Other premature depolarization: Secondary | ICD-10-CM

## 2010-09-30 DIAGNOSIS — E119 Type 2 diabetes mellitus without complications: Secondary | ICD-10-CM

## 2010-09-30 DIAGNOSIS — I2510 Atherosclerotic heart disease of native coronary artery without angina pectoris: Secondary | ICD-10-CM

## 2010-09-30 MED ORDER — TECHNETIUM TC 99M TETROFOSMIN IV KIT
33.0000 | PACK | Freq: Once | INTRAVENOUS | Status: AC | PRN
  Administered 2010-09-30: 33 via INTRAVENOUS

## 2010-09-30 MED ORDER — TECHNETIUM TC 99M TETROFOSMIN IV KIT
11.0000 | PACK | Freq: Once | INTRAVENOUS | Status: AC | PRN
  Administered 2010-09-30: 11 via INTRAVENOUS

## 2010-09-30 MED ORDER — REGADENOSON 0.4 MG/5ML IV SOLN
0.4000 mg | Freq: Once | INTRAVENOUS | Status: AC
  Administered 2010-09-30: 0.4 mg via INTRAVENOUS

## 2010-09-30 NOTE — Progress Notes (Signed)
[place] [address] [phone]  Cardiology Nuclear Med Study  Vir Heeb Carothers is a 77 y.o. male [id number] [birth year]   Nuclear Med Background Indication for Stress Test:  Evaluation for Ischemia and PTCA Patency History: 2004  Angioplasty,3/12 Echo-EF 55% mild AS,AR, 09/21/10 GXT- Abn., Heart Catheterization and2009 Myocardial Perfusion Study- +EKG,no ischemia, EF 57% Cardiac Risk Factors: History of Smoking, Hypertension, Lipids, NIDDM and PVD  Symptoms:  Light-Headedness   Nuclear Pre-Procedure Caffeine/Decaff Intake:  None NPO After: 7:00pm   Lungs:  Clear IV 0.9% NS with Angio Cath:  22g  IV Site: L Forearm  IV Started by:  Eliezer Lofts, EMT-P  Chest Size (in):  44 Cup Size: n/a  Height: 5\' 9"  (1.753 m)  Weight:  194 lb (87.998 kg)  BMI:  Body mass index is 28.65 kg/(m^2). Tech Comments:   Patient took Metoprolol this AM    Nuclear Med Study 1 or 2 day study: 1 day  Stress Test Type:  Treadmill/Lexiscan  Reading MD: Mertie Moores, MD  Order Authorizing Provider:  Dr. Vita Barley  Resting Radionuclide: Technetium 45m Tetrofosmin  Resting Radionuclide Dose: 11 mCi   Stress Radionuclide:  Technetium 19m Tetrofosmin  Stress Radionuclide Dose: 33 mCi           Stress Protocol Rest HR: 55 Stress HR: 117  Rest BP: 139/83 Stress BP: 161/72  Exercise Time (min): 9:03 METS: 7.7          Dose of Adenosine (mg):  n/a Dose of Lexiscan: 0.4 mg  Dose of Atropine (mg): n/a Dose of Dobutamine: n/a mcg/kg/min (at max HR)  Stress Test Technologist: Crissie Figures, RN  Nuclear Technologist:  Charlton Amor, CNMT     Rest Procedure:  Myocardial perfusion imaging was performed at rest 45 minutes following the intravenous administration of Technetium 65m Tetrofosmin. Rest ECG: Sinus Bradycardia with PVC's  Stress Procedure:  The patient exercised for 9:03.  The patient stopped due to leg fatigue and dypnea and denied any  chest pain. Lexiscan administered due to inability to achieve THR. There were ST-T wave changes with exercise.  Technetium 50m Tetrofosmin was injected at peak exercise and myocardial perfusion imaging was performed after a brief delay. Stress ECG: There were mild ST/T abnormalities at baseline.  These abnormalities worsened slightly during exercise.  QPS Raw Data Images:  Normal; no motion artifact; normal heart/lung ratio. Stress Images:  Normal homogeneous uptake in all areas of the myocardium. Rest Images:  Normal homogeneous uptake in all areas of the myocardium. Subtraction (SDS):  No evidence of ischemia. Transient Ischemic Dilatation (Normal <1.22):  1.07 Lung/Heart Ratio (Normal <0.45):  0.39  Quantitative Gated Spect Images QGS EDV:  91 ml QGS ESV:  36 ml QGS cine images:  NL LV Function; NL Wall Motion QGS EF: 60%  Impression Exercise Capacity:  Good exercise capacity. BP Response:  Normal blood pressure response. Clinical Symptoms:  No chest pain. ECG Impression:  Insignificant upsloping ST segment depression. Comparison with Prior Nuclear Study: No significant change from previous study  Overall Impression:  Normal stress nuclear study.  No evidence of ischemia.  Normal LV function.  Ricardo Watkins., MD

## 2010-10-01 NOTE — Progress Notes (Signed)
ROUTED TO DR.[place].Parks [place]ptune

## 2010-11-03 NOTE — Assessment & Plan Note (Signed)
[facility]                            CARDIOLOGY OFFICE NOTE   FAMOUS, SPELLER                       MRN:          [id number]  DATE:03/07/2008                            DOB:          04-17-1931    PRIMARY CARE PHYSICIAN:  Chipper Herb, M.D.   REASON FOR PRESENTATION:  Evaluate the patient with coronary artery  disease and renal artery stenosis.   HISTORY OF PRESENT ILLNESS:  The patient is now 77 years old.  It has  been about 18 months since I last saw him.  He has coronary artery  disease as described below.  Since, I last saw him, he has had no new  cardiovascular complaints.  He says, he has his creatinine followed and  this is slowly rising.  He says he has been told his kidneys were work  about half.  He has had his platelets followed as he is  thrombocytopenic.  He has previous history of lymphoma and he sees Dr.  Beryle Beams.  He has had no cardiovascular complaints.  He does work in  the yard.  He does a little bit of walking though not routinely.  With  his level of activity, he denies any chest pressure, neck, or arm  discomfort.  He does not have any palpitations, presyncope, or syncope.  He has had no PND or orthopnea.  He denies any claudication.   PAST MEDICAL HISTORY:  Coronary artery disease (30% LAD stenosis, 70%  ramus intermedius stenosis.  The patient had PTCA and angioplasty of the  ramus intermedius by [name] in 2004, this was following an abnormal  stress test), peripheral vascular disease (status post right renal  artery stenting), lower extremity tibioperoneal disease, previous  history of lymphoma, thrombocytopenia, chronic renal insufficiency, and  hypertension.   ALLERGIES/INTOLERANCES:  None.   MEDICATIONS:  1. Quinapril 40 mg daily.  2. Aspirin 81 mg daily.  3. Clonidine 0.1 mg b.i.d.  4. Flomax 0.4 mg daily.  5. Actos 45 mg daily.  6. Lipitor 5 mg daily.  7. Fish oil.  8. Norvasc 7.5 mg daily.  9. Lopressor 50 mg q.a.m. and 100 mg q.p.m.  10.Vitamin D.  11.Calcium.   REVIEW OF SYSTEMS:  Positive for some intermittent lower extremity  swelling, mild.  Otherwise negative for all other systems.   PHYSICAL EXAMINATION:  GENERAL:  The patient is pleasant and in no  distress.  VITAL SIGNS:  Blood pressure 171/79, heart rate 54 and regular, weight  193 pounds, and body mass index 27.  HEENT:  Eyelids unremarkable.  Pupils, equal, round and reactive to  light.  Fundi not visualized.  Oral mucosa unremarkable.  NECK:  No jugular venous distention at 45 degrees, carotid upstrokes  brisk and symmetric, no bruits, no thyromegaly.  LYMPHATICS:  No cervical, axillary, or inguinal adenopathy.  LUNGS:  Clear to auscultation bilaterally.  BACK:  No costovertebral angle tenderness.  CHEST:  Unremarkable.  HEART:  PMI not displaced or sustained, S1 and S2 within normal limits.  No S3, no S4, no clicks, no rubs, no murmurs.  ABDOMEN:  Mildly obese, positive bowel sounds.  Normal in frequency and  pitch.  No bruits, no rebound, no guarding, no midline pulsatile mass,  no hepatomegaly, no splenomegaly.  SKIN:  No rashes.  No nodules.  EXTREMITIES:  Pulses 2+ throughout, mild bilateral lower extremity  edema.  NEURO:  Oriented to person, place, and time.  Cranial nerve II-XII  grossly intact, motor grossly intact.   EKG:  Sinus bradycardia, rate 50, axis within normal limits.  Intervals  within normal limits.  Early transition in lead V2, no acute ST-T waves  changes.   ASSESSMENT AND PLAN:  1. Coronary artery disease.  The patient has no ongoing symptoms.      However, he had no symptoms at the time of his abnormal stress test      in 2004.  At that time, he had an angioplasty.  It has been 5 years      since he was evaluated.  He has had ongoing risk factors.      Therefore, he needs an exercise perfusion study.  2. Hypertension.  Blood pressure is elevated here today.  However, he       says at home it is in the [id number] and [id number] systolic and only goes up      when he is in the doctor's office.  I believe this and not make any      changes to his meds.  He did have some blood pressures drawn at      other doctor's office, and they were not elevated.  3. Dyslipidemia is followed by Dr. Laurance Flatten.  The goal should be an LDL      less than 70 and HDL greater than 40, given his diabetes.  4. Diabetes per Dr. Laurance Flatten.  5. Peripheral vascular disease.  He is having his renal function      followed carefully.  He did not have renal artery stenosis in his      left vessel.  At this point, I do not think further peripheral      testing is indicated.  6. Followup.  I will see him back in about 18 months and will see if      he has any abnormality in the stress test or any symptoms.     [name], MD, [facility]  Electronically Signed   JH/MedQ  DD: 03/07/2008  DT: 03/08/2008  Job #: [id number]   cc:   Chipper Herb, M.D.

## 2010-11-06 NOTE — Cardiovascular Report (Signed)
Ricardo Watkins, Ricardo Watkins                          ACCOUNT NO.:  [id number]   MEDICAL RECORD NO.:  [id number]                   PATIENT TYPE:  OIB   LOCATION:  2852                                 FACILITY:  [facility]   PHYSICIAN:  Ethelle Lyon, M.D.             DATE OF BIRTH:  10-Feb-1931   DATE OF PROCEDURE:  02/15/2003  DATE OF DISCHARGE:                              CARDIAC CATHETERIZATION   PROCEDURE:  1. Left heart catheterization.  2. Left ventriculography.  3. Coronary angiography.  4. Bilateral selective renal angiography.   INDICATIONS FOR PROCEDURE:  Mr. Belschner is a 77 year old gentleman with  hypertension, diabetes mellitus and hypertriglyceridemia. He is an active  gentleman without exertional chest discomfort or dyspnea. Due to his  multiple risk factors, he recently underwent  exercise tolerance testing. He  had diagnostic ST abnormalities occurring in stage 2. Based on this early  positive exercise test, he was referred for diagnostic angiography. Due to  hypertension poorly controlled despite sizeable doses of 4 medications,  selective bilateral renal angiography was performed.   DESCRIPTION OF PROCEDURE:  Informed consent was obtained. Under 1% Lidocaine  local anesthesia a 6 French sheath was placed in the right femoral artery  using modified Seldinger technique. Diagnostic angiography  and  ventriculography were performed using JL-4, JR-4 and pigtail catheters.   The pigtail catheter was then pulled back into the suprarenal abdominal  aorta. Abdominal  aortography without runoff was performed by power  injection. There was a questionable lesion of each renal artery. Therefore  each was selectively cannulated using the JR-4 catheter. Angiography was  performed by hand injection.   The patient tolerated the procedure well. He was transferred to the holding  area in stable condition. The sheaths were to be removed there.   COMPLICATIONS:  None.   FINDINGS:  1.  Left ventricle 174/7/24. Ejection fraction 75% without regional wall     motion abnormality.  2. No mitral regurgitation or aortic stenosis.  3. Left main:  Angiographically normal.  4. Left anterior descending artery:  A large vessel giving rise to  a single     large diagonal branch. There is a 30% stenosis of the proximal vessel.  5. Ramus intermedius:  A moderate sized vessel. It is approximately 2.25 mm     in diameter. There is a 70% ostial stenosis.  6. Circumflex:  A large, dominant vessel giving rise to 2 obtuse marginals     in the PDA. It is angiographically normal.  7. Right coronary artery:  A small, nondominant vessel. It is     angiographically normal.  8. Minimally diseased abdominal  aorta. There are single renal arteries     bilaterally. The left renal artery is normal. The right renal artery has     a 90% stenosis occurring at the site of a very early bifurcation. The     lesion involves both branches.  IMPRESSION/RECOMMENDATIONS:  1. Single vessel coronary artery disease involving the ostium of a small     ramus intermedius branch. As the patient is asymptomatic, we will     continue preventive efforts. Continue beta blocker.  2. Normal left ventricular size and systolic function.  3. Severe unilateral renal artery stenosis in the setting of poorly     controlled hypertension despite 4 medications.   Suggest percutaneous revascularization with goals of control of hypertension  and possible preservation of renal function.                                               Ethelle Lyon, M.D.    WED/MEDQ  D:  02/15/2003  T:  02/17/2003  Job:  [id number]   cc:   [name], M.D.   Chipper Herb, M.D.  [address] [place]  [place] [address]  Fax: [phone]

## 2010-11-06 NOTE — Op Note (Signed)
Ricardo Watkins, Ricardo Watkins                ACCOUNT NO.:  [id number]   MEDICAL RECORD NO.:  [id number]          PATIENT TYPE:  AMB   LOCATION:  [facility]                         FACILITY:  [facility]   PHYSICIAN:  Ronald L. Rosana Hoes, M.D.  DATE OF BIRTH:  10/02/1930   DATE OF PROCEDURE:  12/15/2005  DATE OF DISCHARGE:                                 OPERATIVE REPORT   DIAGNOSIS:  Adenocarcinoma of the prostate.   OPERATIVE PROCEDURE:  Cystourethroscopy and robotic-arm Nucletron seed  implantation of the prostate.   SURGEON:  Duane Lope. Rosana Hoes, M.D.   ASSISTANT:  Rexene Edison, M.D.   ANESTHESIA:  General endotracheal.   ESTIMATED BLOOD LOSS:  15 mL.   TUBES:  A 16-French Foley.   COMPLICATIONS:  None.   INDICATION FOR PROCEDURE:  [name] is a very nice 77 year old white male  who originally presented with an elevated PSA of 12.5; he subsequently  underwent ultrasonic biopsy of the prostate which revealed a Gleason's score  6, which is 3 + 3 adenocarcinoma with 10% of the biopsies from the right  side of the prostate and a Gleason's score 7, which was 3 + 4 adenocarcinoma  in 20% of the biopsies from the left side of the prostate.  A CT pelvis and  bone scan essentially revealed no evidence of metastatic disease and after  understanding risks, benefits and alternatives, he elected to proceed with  seed implantation as mono-therapy.  He has been properly simulated and  properly informed.   PROCEDURE IN DETAIL:  The patient was placed in a supine position.  After  proper general endotracheal anesthesia, he was placed in the dorsal  lithotomy position and prepped and draped with Betadine in a sterile  fashion.  A 16-French Foley catheter was inserted and inflated and the  bladder was drained.  A transrectal ultrasound probe was placed after a 16-  French red rubber catheter had been placed in the rectum to vent flatus and  the prostate was localized with both axial and sagittal scanning and  intraoperative dosimetry was performed; this is noted in the records.  Following this, we were comfortable with our dosimetry curves in the  prostate and the safety of the urethra and rectum.  Two holding needles had  been placed in unused coordinates to stabilize the prostate.  The initial  needle was placed for calibration of the base and after that, serial  implantation was performed in the coordinate that had been planned  intraoperatively.  A total of 22 needles were utilized to implant 64 iodine-  125 seeds at 23.3600-mCi total apparent activity.  Postoperatively, we  removed the hardware and performed a static image fluoroscopy; the seeds  appeared to be in excellent position.  Foley catheter was removed and  scanned for seeds; there were none within it.  A flexible cystourethroscopy  was then performed with the patient in the supine position after the  perineal puncture holes had been dressed sterilely and there was moderate  trilobar hypertrophy and grade 1 trabeculation  noted in the bladder.  Efflux of clear urine was  noted from the normally  placed ureteral orifices bilaterally and no lesions in the bladder and the  urethra.  The flexible cystourethroscope was removed and a new 16-French  Foley catheter was inserted.  The bladder was drained and the patient was  taken to the recovery room stable.      [place] Rosana Hoes, M.D.  Electronically Signed     RLD/MEDQ  D:  12/15/2005  T:  12/15/2005  Job:  [id number]

## 2010-11-13 ENCOUNTER — Telehealth: Payer: Self-pay | Admitting: Physician Assistant

## 2010-11-13 NOTE — Telephone Encounter (Signed)
Faxed Stress to [facility] @ [facility] ([id number]).

## 2010-12-10 ENCOUNTER — Encounter: Payer: Self-pay | Admitting: Internal Medicine

## 2010-12-25 ENCOUNTER — Encounter: Payer: Self-pay | Admitting: Family Medicine

## 2010-12-25 DIAGNOSIS — K635 Polyp of colon: Secondary | ICD-10-CM | POA: Insufficient documentation

## 2010-12-25 DIAGNOSIS — K4090 Unilateral inguinal hernia, without obstruction or gangrene, not specified as recurrent: Secondary | ICD-10-CM | POA: Insufficient documentation

## 2011-01-13 ENCOUNTER — Ambulatory Visit (AMBULATORY_SURGERY_CENTER): Payer: Medicare Other | Admitting: *Deleted

## 2011-01-13 VITALS — Ht 69.0 in | Wt 194.0 lb

## 2011-01-13 DIAGNOSIS — Z86010 Personal history of colonic polyps: Secondary | ICD-10-CM

## 2011-01-13 DIAGNOSIS — Z1211 Encounter for screening for malignant neoplasm of colon: Secondary | ICD-10-CM

## 2011-01-13 MED ORDER — PEG-KCL-NACL-NASULF-NA ASC-C 100 G PO SOLR: ORAL | Status: DC

## 2011-01-18 ENCOUNTER — Other Ambulatory Visit (HOSPITAL_COMMUNITY): Payer: Medicare Other

## 2011-01-18 ENCOUNTER — Emergency Department (HOSPITAL_COMMUNITY): Payer: Medicare Other

## 2011-01-18 ENCOUNTER — Inpatient Hospital Stay (HOSPITAL_COMMUNITY)
Admission: EM | Admit: 2011-01-18 | Discharge: 2011-01-20 | DRG: 065 | Disposition: A | Discharge status: Home / Self Care | Payer: Medicare Other | Attending: Family Medicine | Admitting: Family Medicine

## 2011-01-18 DIAGNOSIS — D696 Thrombocytopenia, unspecified: Secondary | ICD-10-CM | POA: Diagnosis present

## 2011-01-18 DIAGNOSIS — Z66 Do not resuscitate: Secondary | ICD-10-CM | POA: Diagnosis present

## 2011-01-18 DIAGNOSIS — Z79899 Other long term (current) drug therapy: Secondary | ICD-10-CM

## 2011-01-18 DIAGNOSIS — C8589 Other specified types of non-Hodgkin lymphoma, extranodal and solid organ sites: Secondary | ICD-10-CM | POA: Diagnosis present

## 2011-01-18 DIAGNOSIS — N183 Chronic kidney disease, stage 3 unspecified: Secondary | ICD-10-CM | POA: Diagnosis present

## 2011-01-18 DIAGNOSIS — L97509 Non-pressure chronic ulcer of other part of unspecified foot with unspecified severity: Secondary | ICD-10-CM | POA: Diagnosis present

## 2011-01-18 DIAGNOSIS — Z7982 Long term (current) use of aspirin: Secondary | ICD-10-CM

## 2011-01-18 DIAGNOSIS — Z23 Encounter for immunization: Secondary | ICD-10-CM

## 2011-01-18 DIAGNOSIS — R471 Dysarthria and anarthria: Secondary | ICD-10-CM | POA: Diagnosis present

## 2011-01-18 DIAGNOSIS — E785 Hyperlipidemia, unspecified: Secondary | ICD-10-CM | POA: Diagnosis present

## 2011-01-18 DIAGNOSIS — Z8546 Personal history of malignant neoplasm of prostate: Secondary | ICD-10-CM

## 2011-01-18 DIAGNOSIS — R29810 Facial weakness: Secondary | ICD-10-CM | POA: Diagnosis present

## 2011-01-18 DIAGNOSIS — Z8673 Personal history of transient ischemic attack (TIA), and cerebral infarction without residual deficits: Secondary | ICD-10-CM

## 2011-01-18 DIAGNOSIS — R279 Unspecified lack of coordination: Secondary | ICD-10-CM | POA: Diagnosis present

## 2011-01-18 DIAGNOSIS — I6350 Cerebral infarction due to unspecified occlusion or stenosis of unspecified cerebral artery: Principal | ICD-10-CM | POA: Diagnosis present

## 2011-01-18 DIAGNOSIS — E119 Type 2 diabetes mellitus without complications: Secondary | ICD-10-CM | POA: Diagnosis present

## 2011-01-18 DIAGNOSIS — I129 Hypertensive chronic kidney disease with stage 1 through stage 4 chronic kidney disease, or unspecified chronic kidney disease: Secondary | ICD-10-CM | POA: Diagnosis present

## 2011-01-18 LAB — COMPREHENSIVE METABOLIC PANEL
Albumin: 3.8 g/dL (ref 3.5–5.2)
BUN: 23 mg/dL (ref 6–23)
Calcium: 9.6 mg/dL (ref 8.4–10.5)
Chloride: 103 mEq/L (ref 96–112)
Creatinine, Ser: 1.68 mg/dL — ABNORMAL HIGH (ref 0.50–1.35)
Total Bilirubin: 0.2 mg/dL — ABNORMAL LOW (ref 0.3–1.2)
Total Protein: 7.1 g/dL (ref 6.0–8.3)

## 2011-01-18 LAB — DIFFERENTIAL
Basophils Absolute: 0 10*3/uL (ref 0.0–0.1)
Basophils Relative: 0 % (ref 0–1)
Eosinophils Absolute: 0.1 10*3/uL (ref 0.0–0.7)
Monocytes Absolute: 1.3 10*3/uL — ABNORMAL HIGH (ref 0.1–1.0)
Monocytes Relative: 17 % — ABNORMAL HIGH (ref 3–12)
Neutro Abs: 4.2 10*3/uL (ref 1.7–7.7)
Neutrophils Relative %: 55 % (ref 43–77)

## 2011-01-18 LAB — CBC
Hemoglobin: 14.3 g/dL (ref 13.0–17.0)
MCH: 34.2 pg — ABNORMAL HIGH (ref 26.0–34.0)
MCHC: 33.9 g/dL (ref 30.0–36.0)
Platelets: 101 10*3/uL — ABNORMAL LOW (ref 150–400)

## 2011-01-18 LAB — PROTIME-INR
INR: 1.01 (ref 0.00–1.49)
Prothrombin Time: 13.5 seconds (ref 11.6–15.2)

## 2011-01-19 ENCOUNTER — Inpatient Hospital Stay (HOSPITAL_COMMUNITY): Payer: Medicare Other

## 2011-01-19 DIAGNOSIS — I369 Nonrheumatic tricuspid valve disorder, unspecified: Secondary | ICD-10-CM

## 2011-01-19 DIAGNOSIS — E1149 Type 2 diabetes mellitus with other diabetic neurological complication: Secondary | ICD-10-CM

## 2011-01-19 DIAGNOSIS — I6350 Cerebral infarction due to unspecified occlusion or stenosis of unspecified cerebral artery: Secondary | ICD-10-CM

## 2011-01-19 DIAGNOSIS — R269 Unspecified abnormalities of gait and mobility: Secondary | ICD-10-CM

## 2011-01-19 DIAGNOSIS — L89899 Pressure ulcer of other site, unspecified stage: Secondary | ICD-10-CM

## 2011-01-19 LAB — COMPREHENSIVE METABOLIC PANEL
CO2: 28 mEq/L (ref 19–32)
Calcium: 9.8 mg/dL (ref 8.4–10.5)
Creatinine, Ser: 1.35 mg/dL (ref 0.50–1.35)
GFR calc Af Amer: >60 mL/min (ref >60)
GFR calc non Af Amer: 51 mL/min — ABNORMAL LOW (ref >60)
Glucose, Bld: 118 mg/dL — ABNORMAL HIGH (ref 70–99)

## 2011-01-19 LAB — APTT: aPTT: 32 seconds (ref 24–37)

## 2011-01-19 LAB — LIPID PANEL: Cholesterol: 140 mg/dL (ref 0–200)

## 2011-01-19 LAB — CARDIAC PANEL(CRET KIN+CKTOT+MB+TROPI)
Relative Index: INVALID (ref 0.0–2.5)
Total CK: 94 U/L (ref 7–232)

## 2011-01-19 LAB — GLUCOSE, CAPILLARY
Glucose-Capillary: 159 mg/dL — ABNORMAL HIGH (ref 70–99)
Glucose-Capillary: 135 mg/dL — ABNORMAL HIGH (ref 70–99)

## 2011-01-19 LAB — HEMOGLOBIN A1C
Hgb A1c MFr Bld: 5.7 % — ABNORMAL HIGH (ref <5.7)
Mean Plasma Glucose: 117 mg/dL — ABNORMAL HIGH (ref <117)

## 2011-01-19 LAB — CBC
Hemoglobin: 14.2 g/dL (ref 13.0–17.0)
MCV: 101.2 fL — ABNORMAL HIGH (ref 78.0–100.0)
Platelets: 104 10*3/uL — ABNORMAL LOW (ref 150–400)
RBC: 4.2 MIL/uL — ABNORMAL LOW (ref 4.22–5.81)
WBC: 8.3 10*3/uL (ref 4.0–10.5)

## 2011-01-20 LAB — TSH: TSH: 0.785 u[IU]/mL (ref 0.350–4.500)

## 2011-01-20 LAB — VITAMIN B12: Vitamin B-12: 240 pg/mL (ref 211–911)

## 2011-01-21 LAB — FOLATE RBC: RBC Folate: 733 ng/mL — ABNORMAL HIGH (ref >=366)

## 2011-01-21 NOTE — H&P (Signed)
NAMECIRILO, MORITA NO.:  [id number]  MEDICAL RECORD NO.:  [id number]  LOCATION:  3009                         FACILITY:  [facility]  PHYSICIAN:  Ronan Dion A. Walker Kehr, M.D.    DATE OF BIRTH:  1930-09-30  DATE OF ADMISSION:  01/18/2011 DATE OF DISCHARGE:                             HISTORY & PHYSICAL   PRIMARY CARE PHYSICIAN:  Dr. Doren Custard at [facility].  CHIEF COMPLAINT:  Could not walk well.  HISTORY OF PRESENT ILLNESS:  Mr. Quander is a 77 year old man with diabetes, hypertension, hyperlipidemia, and evidence of old infarcts on CT scan, who presents with about a 24-hour history of staggering walk and falling to the right which acutely worsened around 10:00 a.m. this morning at which time family brought him to the emergency room.  Did have some vomiting prior to arrival, but no nausea, abdominal pain or diarrhea.  Family reports the left side of his face appeared droopy and he had difficulty opening his eyes.  The patient also describes some left-sided pain in the midaxillary line.  Family also raised concerns about his left second toe which is black and discolored from a wound. The patient states that it started as a blister about a week ago and has turned black since then.  He has been putting Neosporin on it.  In the ED, he did have a swallow evaluation which he passed.  PAST MEDICAL HISTORY: 1. Type 2 diabetes, not insulin dependent. 2. Hypertension. 3. Hyperlipidemia. 4. Chronic kidney disease, stage III. 5. Lymphoma in 2002, currently in remission. 6. Prostate cancer in 2008, treated with radiation seeds.  PAST SURGICAL HISTORY:  Carpal tunnel on the right, also had an abdominal lymph node dissection.  SOCIAL HISTORY:  Lives with his wife.  No other family lives with him. He is a former smoker, quit 25 years ago.  No alcohol or drug use.  FAMILY HISTORY:  Positive for diabetes and hypertension in siblings.  MEDICATIONS: 1. Metoprolol 100 mg daily. 2.  Calcium carbonate 1 tablet daily. 3. Quinapril 40 mg daily. 4. Actos 45 mg daily. 5. Amlodipine 5 mg 1-1/2 tablets daily. 6. Clonidine 0.1 b.i.d. 7. Glipizide 5 mg daily. 8. Vitamin D 1000 units 2 capsules daily. 9. Atorvastatin 10 mg half a tab daily. 10.Flomax 0.4 mg daily. 11.Aspirin 81 mg daily. 12.Fish oil 2 capsules daily.  ALLERGIES:  No known drug allergies.  PHYSICAL EXAMINATION:  VITAL SIGNS:  Temperature 97.8, pulse 74, respiratory rate 16, blood pressure 162/78, O2 saturation 97% on room air. GENERAL:  He is lying comfortably in the gurney, eating food with his left hand which is his nondominant hand. HEENT: Atraumatic, normocephalic.  Extraocular muscles are intact. Pupils are equal, round, reactive to light.  He has moist mucous membranes. CARDIOVASCULAR:  Regular rate and rhythm.  No murmurs. LUNGS:  Clear to auscultation.  No wheezes, rales or rhonchi. ABDOMEN:  Positive bowel sounds, soft, nontender, nondistended. EXTREMITIES:  No edema.  Good cap refill bilaterally.  Does have a hard black eschar on the dorsal surface of his left second toe.  There is no surrounding erythema or swelling. NEUROLOGIC:  Positive left-sided facial droop.  He had a  positive finger- nose-finger test bilaterally, the right was worse than left.  He also had a positive rapid alternating movements test with the left worse than the right.  There was no pronator drift.  He had a negative Romberg.  He is able to walk, although unsteadily and is unable to do the heel-toe walk.  LABS AND STUDIES:  Basic metabolic panel shows sodium 142, potassium 4.0, chloride 103, bicarb 30, BUN 23, creatinine 1.68, glucose 124. Liver panel was within normal limits.  CBC showed a white count 7.7, hemoglobin 14.3, platelets 101.  INR was 1.01.  Troponins were negative. CT of the head showed diffuse atrophic and small vessel ischemic changes, possible focal old infarcts in the left posteroparietal and right  anterior frontal regions.  No evidence for acute infarct, hemorrhage, or mass lesion.  ASSESSMENT/PLAN:  This is a 77 year old man with hypertension, diabetes, hyperlipidemia, evidence of old infarct on CT with a stroke, likely right sided. 1. Stroke.  Given left facial droop and ataxic signs primarily on the     right, we would expect a right-sided lesions.  The exact start of     symptoms is unclear, but at least greater than 10 hours prior to     admission.  Symptoms have improved, however, not clearly resolved.     We will initiate stroke workup protocol with an MRI of the head     without contrast, 2-D cardiac echo, carotid Dopplers, EKG, lipid     panel, and hemoglobin A1c.  We will also consult PT and OT.  We     will start him on aspirin 325 mg daily.  He is on fall precautions     and can get out of bed to chair.  We will monitor him on telemetry     and we will also provide hypertension and diabetes teaching while     here in the hospital. 2. Diabetes type 2.  We will continue his home medications and monitor     with CBGs.  No sliding scale insulin at this time. 3. Hypertension, elevated in the emergency room.  We will restart his     home medications and monitor. 4. Hyperlipidemia.  We will check lipids as part of a stroke workup,     otherwise, continue his home medications. 5. Chronic kidney disease stage III.  His creatinine in the emergency     room was 1.68 which appears to be about his baseline which ranges     from 1.4-1.8. 6. History of cancers.  He reports no weight loss, fever or sweats to     indicate a recurrence.  We will monitor at this time. 7. FEN:  Diabetic diet.  Saline lock IV. 8. Prophylaxis.  Enoxaparin subcu. 9. Disposition.  Pending workup and evaluation. 10. Left toe wound.  No signs of infection.  Will consult wound care.    ______________________________ Jaquita Rector, MD   ______________________________ Arty Baumgartner. Walker Kehr, M.D.    EB/MEDQ  D:   01/18/2011  T:  01/19/2011  Job:  [id number]  Electronically Signed by Jaquita Rector MD on 01/19/2011 07:33:31 AM Electronically Signed by Candelaria Celeste M.D. on 01/21/2011 03:32:41 PM

## 2011-01-22 NOTE — Discharge Summary (Signed)
Ricardo Watkins, Ricardo Watkins NO.:  [id number]  MEDICAL RECORD NO.:  [id number]  LOCATION:  3009                         FACILITY:  [facility]  PHYSICIAN:  Blane Ohara Luellen Howson, M.D.DATE OF BIRTH:  08-Dec-1930  DATE OF ADMISSION:  01/18/2011 DATE OF DISCHARGE:  01/20/2011                              DISCHARGE SUMMARY  PRIMARY CARE PHYSICIAN:  Karis Juba, [place] at [facility].  DISCHARGE DIAGNOSES: 1. Cerebrovascular accident caused by acute nonhemorrhagic infarct in     right internal capsule in genu likely secondary to ischemia from     hypotension. 2. Left second toe eschar and ulcer 3. Hypertension 4. Diabetes mellitus type 2. 5. Dyslipidemia. 6. Carpal tunnel. 7. Chronic kidney stage, stage 3. 8. Thrombocytopenia.  DISCHARGE MEDICATIONS: 1. Metoprolol XR 100 mg once daily. 2. Quinapril 40 mg once daily. 3. Actos 45 mg once daily. 4. Amlodipine 10 mg once daily. 5. Glipizide 5 mg twice daily. 6. Lipitor 10 mg once daily. 7. Flomax 0.4 mg once daily. 8. Plavix 75 mg once daily, that was changed from aspirin 81 mg once     daily. 9. Fish oil OTC.  LABS AT DISCHARGE/PERTINENT LABS: 1. MRI, acute nonhemorrhagic infarct in genu and posterior limb of     right interior capsule.  Chronic microvascular ischemia, remote     lacunar infarct of cerebellum and basal ganglia bilaterally, remote     critical infarct of anterior right frontal lobe and posterior high     left parietal lobe. 2. CT head without contrast showed diffuse atrial kick and small     vessel ischemic changes, possible focal old infarct from the left     posterior parietal and right inferior frontal region. 3. Carotid ultrasound, preliminary study showed 60-79% blockage in the     left internal carotid artery, right internal carotid artery had     within normal limits. 4. Echocardiogram, normal systolic function with EF of 55%-60%, mild     LVH, and grade I diastolic  dysfunction. 5. EKG, within normal limits. Premature ventricular complex x1, normal     sinus rhythm. 6. Cardiac enzymes, troponins negative x1. 7. CBCs, low platelets of 101 and 104, otherwise normal CBC. 8. Lipids, total cholesterol 140, triglycerides 176, HDL 36, LDL 68, 9. Hemoglobin A1c 5.7.  PENDING LAB RESULTS:  TSH, RPR, folate, and vitamin B12.   HOSPITAL COURSE:  A 77 year old male with past medical history of hypertension, diabetes, dyslipidemia, prostate cancer, lymphoma in remission, CKD stage 3, who presented with greater than 10 hours of ataxia, left facial droop and clumsy hand dysarthria concerning for stroke. 1. CVA.  The patient was admitted for possible stroke and diagnosis of     CVA based on MRI.  Cardiac causes were ruled out.  Etiology is     likely ischemic secondary to hypotension. OT worked with patient on     transferring into the bath tub because the patient was on aspirin     and had a CVA, we cannot assume this as an aspirin failure, we     stopped the aspirin and switch the patient to Plavix. Medical management optomized.  2. Left  second toe ulcers.  Wound Care was consulted and they order     bactroban ointment. Ulcers were not thought to be actively infected. 3. Mild cognitive impairment.  Speech evaluated the patient and MMSE     was 25/30, however, the patient just to be concerning for "fatigue     for 5-6 years."  The patient had labs drawn for organic causes. Those are pending at discharge.  4. Hypertension.  The patient had hypertension on admission that has     hence resolved with administration of home antihypertensive.  The     patient was discontinued on clonidine because of lower blood     pressures than he had had at home.   The patient's condition at time of discharge,  the patient has continued left facial droop and ataxia, however, both sources are improved. Clumsy hand dysarthria has resolved.  PENDING TEST RESULTS AT TIME OF  DISCHARGE:  TSH, RPR, vitamin B12 and folate.  DISPOSITION:  The patient will return home with wife and primary care giver.  PT has evaluated and recommended outpatient Neuro referral for ataxia.  Speech has evaluated and recommended outpatient SOP services for cognition and safety services.  DISCHARGE FOLLOWUP:  Follow up in the hospital stay with PCP Dr. Karis Juba  within in 1 week.  FOLLOWUP ISSUES:  Hypertension. Per patient's wife, the patient's systolic pressure average is 160s.  The patient's medication should be optimized to keep systolic blood pressure less than 150. 1. Thrombocytopenia.  The patient's platelets range from 101-104.  If     not previously worked up, we recommend working up thrombocytopenia     on an outpatient basis.     Lynne Leader, MD   ______________________________ Blane Ohara Krissa Utke, M.D.    EC/MEDQ  D:  01/20/2011  T:  01/20/2011  Job:  [id number]  cc:   Karis Juba, [place]  Electronically Signed by Lynne Leader  on 01/21/2011 02:23:09 PM Electronically Signed by Lissa Morales M.D. on 01/22/2011 02:38:52 PM

## 2011-01-27 ENCOUNTER — Other Ambulatory Visit: Payer: Medicare Other | Admitting: Internal Medicine

## 2011-02-01 ENCOUNTER — Ambulatory Visit: Payer: Medicare Other | Attending: Neurology | Admitting: Rehabilitative and Restorative Service Providers"

## 2011-02-01 DIAGNOSIS — R5381 Other malaise: Secondary | ICD-10-CM | POA: Insufficient documentation

## 2011-02-01 DIAGNOSIS — I69998 Other sequelae following unspecified cerebrovascular disease: Secondary | ICD-10-CM | POA: Insufficient documentation

## 2011-02-01 DIAGNOSIS — I69919 Unspecified symptoms and signs involving cognitive functions following unspecified cerebrovascular disease: Secondary | ICD-10-CM | POA: Insufficient documentation

## 2011-02-01 DIAGNOSIS — R41841 Cognitive communication deficit: Secondary | ICD-10-CM | POA: Insufficient documentation

## 2011-02-01 DIAGNOSIS — R279 Unspecified lack of coordination: Secondary | ICD-10-CM | POA: Insufficient documentation

## 2011-02-01 DIAGNOSIS — Z5189 Encounter for other specified aftercare: Secondary | ICD-10-CM | POA: Insufficient documentation

## 2011-02-01 DIAGNOSIS — M6281 Muscle weakness (generalized): Secondary | ICD-10-CM | POA: Insufficient documentation

## 2011-02-03 ENCOUNTER — Ambulatory Visit: Payer: Medicare Other | Admitting: *Deleted

## 2011-02-04 ENCOUNTER — Ambulatory Visit: Payer: Medicare Other | Admitting: Occupational Therapy

## 2011-02-08 ENCOUNTER — Ambulatory Visit: Payer: Medicare Other | Admitting: Occupational Therapy

## 2011-02-10 ENCOUNTER — Ambulatory Visit: Payer: Medicare Other

## 2011-02-12 ENCOUNTER — Ambulatory Visit: Payer: Medicare Other

## 2011-02-12 ENCOUNTER — Ambulatory Visit: Payer: Medicare Other | Admitting: Occupational Therapy

## 2011-02-16 ENCOUNTER — Ambulatory Visit: Payer: Medicare Other

## 2011-02-18 ENCOUNTER — Other Ambulatory Visit: Payer: Medicare Other | Admitting: Internal Medicine

## 2011-02-19 ENCOUNTER — Ambulatory Visit: Payer: Medicare Other

## 2011-02-24 ENCOUNTER — Ambulatory Visit: Payer: Medicare Other | Attending: Neurology | Admitting: Rehabilitative and Restorative Service Providers"

## 2011-02-24 ENCOUNTER — Ambulatory Visit: Payer: Medicare Other | Admitting: Occupational Therapy

## 2011-02-24 ENCOUNTER — Encounter: Payer: Medicare Other | Admitting: Occupational Therapy

## 2011-02-24 ENCOUNTER — Ambulatory Visit: Payer: Medicare Other

## 2011-02-24 DIAGNOSIS — R5381 Other malaise: Secondary | ICD-10-CM | POA: Insufficient documentation

## 2011-02-24 DIAGNOSIS — M6281 Muscle weakness (generalized): Secondary | ICD-10-CM | POA: Insufficient documentation

## 2011-02-24 DIAGNOSIS — I69919 Unspecified symptoms and signs involving cognitive functions following unspecified cerebrovascular disease: Secondary | ICD-10-CM | POA: Insufficient documentation

## 2011-02-24 DIAGNOSIS — Z5189 Encounter for other specified aftercare: Secondary | ICD-10-CM | POA: Insufficient documentation

## 2011-02-24 DIAGNOSIS — R279 Unspecified lack of coordination: Secondary | ICD-10-CM | POA: Insufficient documentation

## 2011-02-24 DIAGNOSIS — I69998 Other sequelae following unspecified cerebrovascular disease: Secondary | ICD-10-CM | POA: Insufficient documentation

## 2011-02-26 ENCOUNTER — Ambulatory Visit: Payer: Medicare Other | Admitting: Occupational Therapy

## 2011-03-01 ENCOUNTER — Ambulatory Visit: Payer: Medicare Other | Admitting: Rehabilitative and Restorative Service Providers"

## 2011-03-01 ENCOUNTER — Ambulatory Visit: Payer: Medicare Other | Admitting: Occupational Therapy

## 2011-03-03 ENCOUNTER — Ambulatory Visit: Payer: Medicare Other | Admitting: Occupational Therapy

## 2011-03-03 ENCOUNTER — Ambulatory Visit: Payer: Medicare Other | Admitting: Rehabilitative and Restorative Service Providers"

## 2011-03-03 ENCOUNTER — Ambulatory Visit: Payer: Medicare Other

## 2011-03-05 ENCOUNTER — Ambulatory Visit: Payer: Medicare Other

## 2011-03-10 ENCOUNTER — Ambulatory Visit: Payer: Medicare Other | Admitting: Rehabilitative and Restorative Service Providers"

## 2011-03-10 ENCOUNTER — Ambulatory Visit: Payer: Medicare Other | Admitting: Occupational Therapy

## 2011-08-06 DIAGNOSIS — J029 Acute pharyngitis, unspecified: Secondary | ICD-10-CM | POA: Diagnosis not present

## 2011-08-06 DIAGNOSIS — J069 Acute upper respiratory infection, unspecified: Secondary | ICD-10-CM | POA: Diagnosis not present

## 2011-08-06 DIAGNOSIS — B360 Pityriasis versicolor: Secondary | ICD-10-CM | POA: Diagnosis not present

## 2011-08-16 ENCOUNTER — Telehealth: Payer: Self-pay | Admitting: Oncology

## 2011-08-16 NOTE — Telephone Encounter (Signed)
Gave pt appt for March 2013, lab and md

## 2011-09-10 ENCOUNTER — Telehealth: Payer: Self-pay | Admitting: *Deleted

## 2011-09-10 ENCOUNTER — Ambulatory Visit (HOSPITAL_BASED_OUTPATIENT_CLINIC_OR_DEPARTMENT_OTHER): Payer: Medicare Other | Admitting: Oncology

## 2011-09-10 ENCOUNTER — Other Ambulatory Visit (HOSPITAL_BASED_OUTPATIENT_CLINIC_OR_DEPARTMENT_OTHER): Payer: Medicare Other | Admitting: Lab

## 2011-09-10 ENCOUNTER — Encounter: Payer: Self-pay | Admitting: Oncology

## 2011-09-10 VITALS — BP 150/79 | HR 59 | Temp 96.7°F | Ht 69.0 in | Wt 201.7 lb

## 2011-09-10 DIAGNOSIS — Z86718 Personal history of other venous thrombosis and embolism: Secondary | ICD-10-CM | POA: Diagnosis not present

## 2011-09-10 DIAGNOSIS — N189 Chronic kidney disease, unspecified: Secondary | ICD-10-CM | POA: Diagnosis not present

## 2011-09-10 DIAGNOSIS — C61 Malignant neoplasm of prostate: Secondary | ICD-10-CM | POA: Insufficient documentation

## 2011-09-10 DIAGNOSIS — C8589 Other specified types of non-Hodgkin lymphoma, extranodal and solid organ sites: Secondary | ICD-10-CM | POA: Diagnosis not present

## 2011-09-10 DIAGNOSIS — D693 Immune thrombocytopenic purpura: Secondary | ICD-10-CM

## 2011-09-10 DIAGNOSIS — I639 Cerebral infarction, unspecified: Secondary | ICD-10-CM | POA: Insufficient documentation

## 2011-09-10 DIAGNOSIS — C8510 Unspecified B-cell lymphoma, unspecified site: Secondary | ICD-10-CM

## 2011-09-10 HISTORY — DX: Cerebral infarction, unspecified: I63.9

## 2011-09-10 HISTORY — DX: Malignant neoplasm of prostate: C61

## 2011-09-10 LAB — COMPREHENSIVE METABOLIC PANEL
ALT: 37 U/L (ref 0–53)
AST: 28 U/L (ref 0–37)
Alkaline Phosphatase: 83 U/L (ref 39–117)
BUN: 19 mg/dL (ref 6–23)
Creatinine, Ser: 1.64 mg/dL — ABNORMAL HIGH (ref 0.50–1.35)
Total Bilirubin: 0.5 mg/dL (ref 0.3–1.2)

## 2011-09-10 LAB — MORPHOLOGY: PLT EST: DECREASED

## 2011-09-10 LAB — CBC WITH DIFFERENTIAL/PLATELET
EOS%: 1.3 % (ref 0.0–7.0)
Eosinophils Absolute: 0.1 10*3/uL (ref 0.0–0.5)
MCV: 102.6 fL — ABNORMAL HIGH (ref 79.3–98.0)
MONO%: 12.2 % (ref 0.0–14.0)
NEUT#: 2.8 10*3/uL (ref 1.5–6.5)
RBC: 4.07 10*6/uL — ABNORMAL LOW (ref 4.20–5.82)
RDW: 17.3 % — ABNORMAL HIGH (ref 11.0–14.6)

## 2011-09-10 NOTE — Telephone Encounter (Signed)
made patient appointment  for 09-12-2012 starting at 10:30am

## 2011-09-11 NOTE — Progress Notes (Signed)
Hematology and Oncology Follow Up Visit  Ricardo Watkins [id number] 01-Jan-1931 78 y.o. 09/11/2011 3:38 PM   Principle Diagnosis: Encounter Diagnoses  Name Primary?  . Low grade B-cell lymphoma Yes  . Prostate cancer   . Thrombotic stroke   . Prostate ca      Interim History:    followup visit for this 78 year old man initially diagnosed with a follicular grade 2,  B-cell non-Hodgkin's lymphoma in August of 2000 when he presented with bulky retroperitoneal and periaortic lymphadenopathy. He achieved a near complete response to 4 cycles of fludarabine, mitoxantrone, and dexamethasone. Due to significant myelosuppression, chemotherapy was stopped after 4 cycles. He was put on consolidation therapy with Rituxan weekly x4. All treatments were completed by 07/21/1999. He has had no signs of recurrent lymphoma since that time. Over time he has developed other problems. He developed cancer of the prostate Gleason 3+3 right lobe, 3+ 4 left lobe, in May of 2007 treated with radioactive iodine seed implants. He developed a modest fall in his platelet count 4 years ago consistent with chronic ITP. Platelets have stayed at or around 100,000.  Since last visit here, he suffered a stroke presenting with right facial weakness and confusion. He was admitted to [facility] 01/18/2011. CT and MRI showed acute infarction in the genu and posterior limb of the right anterior capsule. Doppler studies of his carotids showed 60-79% occlusion on the left with normal flow on the right. He was on aspirin 81 mg daily at time of his stroke. Aspirin was increased to 81 mg twice daily. Medication list also includes Plavix 75 mg daily. Not clear if this was started at time of the stroke as well but I suspect that it was. Echocardiogram done at that time evaluation showed a normal ejection fraction of 55-60%.    Medications: reviewed  Allergies:  Allergies  Allergen Reactions  . Glucophage (Metformin Hydrochloride)    Chest pain  . Zetia (Ezetimibe)     weakness  . Advicor Rash  . Tricor Rash    Review of Systems: Constitutional:   He is just starting to get back on his feet from changes from the stroke Respiratory: no cough or dyspnea  Cardiovascular:   No chest pain or palpitations Gastrointestinal: no abdominal pain or change in bowel habit  Genito-Urinary:  no dysuria frequency or nocturia no hematuria Musculoskeletal: no muscle or bone pain  Neurologic: persistent left facial palsy  Skin: no rash  Remaining ROS negative.  Physical Exam: Blood pressure 150/79, pulse 59, temperature 96.7 F (35.9 C), temperature source Oral, height 5\' 9"  (1.753 m), weight 201 lb 11.2 oz (91.491 kg). Wt Readings from Last 3 Encounters:  09/10/11 201 lb 11.2 oz (91.491 kg)  01/13/11 194 lb (87.998 kg)  09/30/10 194 lb (87.998 kg)     General appearance:  well nourished Caucasian man new left facial palsy immediately obvious HENNT:  pharynx no erythema or exudate Lymph nodes:  no cervical supraclavicular axillary or inguinal adenopathy Breasts: Lungs: clear to auscultation resonant to percussion  Heart: regular rhythm 2/6 systolic murmur left sternal border and second right intercostal space  Abdomen: soft nontender no mass no organomegaly  Extremities: no edema no calf tenderness  Vascular: there is a faint right carotid bruit carotid pulses 2+ symmetric  Neurologic: he is alert and oriented. Left facial palsy. Pupils equal round reactive to light. Optic disc sharp. Full extraocular movements. Cranial nerves grossly normal formal visual testing and hearing testing not done. Motor strength  is 5 over 5. Reflexes 1+ symmetric. Upper body coordination normal. Gait not tested. No rash or ecchymosis  Skin:  Lab Results: Lab Results  Component Value Date   WBC 6.5 09/10/2011   HGB 14.1 09/10/2011   HCT 41.8 09/10/2011   MCV 102.6* 09/10/2011   PLT 96* 09/10/2011     Chemistry      Component Value Date/Time     NA 139 09/10/2011 1204   K 4.3 09/10/2011 1204   CL 103 09/10/2011 1204   CO2 27 09/10/2011 1204   BUN 19 09/10/2011 1204   CREATININE 1.64* 09/10/2011 1204      Component Value Date/Time   CALCIUM 8.7 09/10/2011 1204   ALKPHOS 83 09/10/2011 1204   AST 28 09/10/2011 1204   ALT 37 09/10/2011 1204   BILITOT 0.5 09/10/2011 1204       Impression and Plan: 1.  Low-grade, B-cell, non-Hodgkin's lymphoma diagnosed July 2000, treated with a combination of chemotherapy and Rituxan.  He remains clinically free of disease at this time now out almost 13 years from diagnosis. 2.  Gleason 3+ 3 right lobe, 3 + 4 left lobe prostate cancer diagnosed May 2007; treated with radioactive seed implants. Current PSA undetectable at 0.02 done 09/10/2011 3.  Chronic ITP.  Platelet count is stable at around 100,000. 4.  Chronic renal insufficiency. Fluctuating creatinines today 1.6 within range your previous values. 5.  Essential hypertension. 6.  Type 2 diabetes. 7.  History of a left lower extremity DVT while being treated for lymphoma December 2000. 8. Right-sided thrombotic stroke with residual left-sided neurologic deficits in 01/18/2011 9.  History of renal artery stenosis. #10. Valvular heart disease with minimal changes in the aortic and mitral valves by echocardiogram    CC:.  Dr. Jenna Luo, [facility]; Dr. Peter Martinique; Dr. Arloa Koh; Dr. Jenny Reichmann ran   Annia Belt, MD 3/23/20133:38 PM

## 2011-09-13 ENCOUNTER — Telehealth: Payer: Self-pay

## 2011-09-13 NOTE — Telephone Encounter (Signed)
Message copied by Johny Drilling on Mon Sep 13, 2011 11:26 AM ------      Message from: Annia Belt      Created: Sat Sep 11, 2011 11:48 AM       Call pt - sugar running a little high at 241; PSA not elevated; chronic decrease in kidney function      Forward copy to Dr Raford Pitcher, [facility]

## 2011-09-13 NOTE — Telephone Encounter (Signed)
Pt notified of lab results per Dr Azucena Freed attached note.  Pt verbalizes understanding. Labs to Dr Dennard Schaumann.  dph

## 2011-09-15 DIAGNOSIS — K4090 Unilateral inguinal hernia, without obstruction or gangrene, not specified as recurrent: Secondary | ICD-10-CM | POA: Diagnosis not present

## 2011-09-15 DIAGNOSIS — Z8546 Personal history of malignant neoplasm of prostate: Secondary | ICD-10-CM | POA: Diagnosis not present

## 2011-09-15 DIAGNOSIS — R351 Nocturia: Secondary | ICD-10-CM | POA: Diagnosis not present

## 2011-09-30 DIAGNOSIS — Z961 Presence of intraocular lens: Secondary | ICD-10-CM | POA: Diagnosis not present

## 2011-10-18 DIAGNOSIS — Z8546 Personal history of malignant neoplasm of prostate: Secondary | ICD-10-CM | POA: Diagnosis not present

## 2011-10-18 DIAGNOSIS — N320 Bladder-neck obstruction: Secondary | ICD-10-CM | POA: Diagnosis not present

## 2011-10-18 DIAGNOSIS — R351 Nocturia: Secondary | ICD-10-CM | POA: Diagnosis not present

## 2011-11-20 ENCOUNTER — Encounter: Payer: Self-pay | Admitting: Cardiology

## 2012-01-04 ENCOUNTER — Encounter: Payer: Self-pay | Admitting: Internal Medicine

## 2012-03-17 DIAGNOSIS — I259 Chronic ischemic heart disease, unspecified: Secondary | ICD-10-CM | POA: Diagnosis not present

## 2012-03-17 DIAGNOSIS — Z23 Encounter for immunization: Secondary | ICD-10-CM | POA: Diagnosis not present

## 2012-03-17 DIAGNOSIS — E119 Type 2 diabetes mellitus without complications: Secondary | ICD-10-CM | POA: Diagnosis not present

## 2012-03-17 DIAGNOSIS — E785 Hyperlipidemia, unspecified: Secondary | ICD-10-CM | POA: Diagnosis not present

## 2012-03-17 DIAGNOSIS — I10 Essential (primary) hypertension: Secondary | ICD-10-CM | POA: Diagnosis not present

## 2012-04-12 DIAGNOSIS — Z8546 Personal history of malignant neoplasm of prostate: Secondary | ICD-10-CM | POA: Diagnosis not present

## 2012-04-17 DIAGNOSIS — N320 Bladder-neck obstruction: Secondary | ICD-10-CM | POA: Diagnosis not present

## 2012-04-17 DIAGNOSIS — Z8546 Personal history of malignant neoplasm of prostate: Secondary | ICD-10-CM | POA: Diagnosis not present

## 2012-04-17 DIAGNOSIS — R351 Nocturia: Secondary | ICD-10-CM | POA: Diagnosis not present

## 2012-06-19 DIAGNOSIS — E119 Type 2 diabetes mellitus without complications: Secondary | ICD-10-CM | POA: Diagnosis not present

## 2012-06-19 DIAGNOSIS — I10 Essential (primary) hypertension: Secondary | ICD-10-CM | POA: Diagnosis not present

## 2012-06-19 DIAGNOSIS — N189 Chronic kidney disease, unspecified: Secondary | ICD-10-CM | POA: Diagnosis not present

## 2012-06-19 DIAGNOSIS — E785 Hyperlipidemia, unspecified: Secondary | ICD-10-CM | POA: Diagnosis not present

## 2012-07-07 DIAGNOSIS — I2510 Atherosclerotic heart disease of native coronary artery without angina pectoris: Secondary | ICD-10-CM | POA: Diagnosis not present

## 2012-07-07 DIAGNOSIS — E119 Type 2 diabetes mellitus without complications: Secondary | ICD-10-CM | POA: Diagnosis not present

## 2012-07-07 DIAGNOSIS — N189 Chronic kidney disease, unspecified: Secondary | ICD-10-CM | POA: Diagnosis not present

## 2012-07-07 DIAGNOSIS — I10 Essential (primary) hypertension: Secondary | ICD-10-CM | POA: Diagnosis not present

## 2012-08-16 ENCOUNTER — Telehealth: Payer: Self-pay | Admitting: Oncology

## 2012-08-16 NOTE — Telephone Encounter (Signed)
spoke with pt..gv appt d/t.Ricardo Kitchentd

## 2012-08-20 ENCOUNTER — Encounter: Payer: Self-pay | Admitting: *Deleted

## 2012-08-21 ENCOUNTER — Telehealth: Payer: Self-pay | Admitting: Oncology

## 2012-08-21 NOTE — Telephone Encounter (Signed)
spoke with pt .Marland KitchenMarland Kitchenpt aware of appt d/t...td

## 2012-09-11 ENCOUNTER — Telehealth: Payer: Self-pay | Admitting: Physician Assistant

## 2012-09-11 DIAGNOSIS — I2510 Atherosclerotic heart disease of native coronary artery without angina pectoris: Secondary | ICD-10-CM

## 2012-09-11 MED ORDER — CLOPIDOGREL BISULFATE 75 MG PO TABS: 75.0000 mg | ORAL_TABLET | Freq: Every day | ORAL | Status: DC

## 2012-09-11 NOTE — Telephone Encounter (Signed)
Medication refilled per protocol. 

## 2012-09-12 ENCOUNTER — Ambulatory Visit: Payer: Medicare Other | Admitting: Oncology

## 2012-09-12 ENCOUNTER — Other Ambulatory Visit: Payer: Medicare Other | Admitting: Lab

## 2012-09-22 ENCOUNTER — Ambulatory Visit (HOSPITAL_BASED_OUTPATIENT_CLINIC_OR_DEPARTMENT_OTHER): Payer: Medicare Other | Admitting: Oncology

## 2012-09-22 ENCOUNTER — Telehealth: Payer: Self-pay | Admitting: Oncology

## 2012-09-22 ENCOUNTER — Other Ambulatory Visit (HOSPITAL_BASED_OUTPATIENT_CLINIC_OR_DEPARTMENT_OTHER): Payer: Medicare Other | Admitting: Lab

## 2012-09-22 VITALS — BP 130/70 | HR 58 | Temp 96.8°F | Resp 20 | Ht 69.0 in | Wt 207.4 lb

## 2012-09-22 DIAGNOSIS — C8589 Other specified types of non-Hodgkin lymphoma, extranodal and solid organ sites: Secondary | ICD-10-CM

## 2012-09-22 DIAGNOSIS — C61 Malignant neoplasm of prostate: Secondary | ICD-10-CM

## 2012-09-22 DIAGNOSIS — N189 Chronic kidney disease, unspecified: Secondary | ICD-10-CM | POA: Diagnosis not present

## 2012-09-22 DIAGNOSIS — D693 Immune thrombocytopenic purpura: Secondary | ICD-10-CM | POA: Diagnosis not present

## 2012-09-22 DIAGNOSIS — C8510 Unspecified B-cell lymphoma, unspecified site: Secondary | ICD-10-CM

## 2012-09-22 LAB — COMPREHENSIVE METABOLIC PANEL (CC13)
BUN: 24.7 mg/dL (ref 7.0–26.0)
CO2: 25 mEq/L (ref 22–29)
Calcium: 8.7 mg/dL (ref 8.4–10.4)
Chloride: 107 mEq/L (ref 98–107)
Creatinine: 1.8 mg/dL — ABNORMAL HIGH (ref 0.7–1.3)
Glucose: 109 mg/dl — ABNORMAL HIGH (ref 70–99)
Total Bilirubin: 0.35 mg/dL (ref 0.20–1.20)

## 2012-09-22 LAB — CBC WITH DIFFERENTIAL/PLATELET
Basophils Absolute: 0 10*3/uL (ref 0.0–0.1)
Eosinophils Absolute: 0.2 10*3/uL (ref 0.0–0.5)
HCT: 37.2 % — ABNORMAL LOW (ref 38.4–49.9)
HGB: 12.3 g/dL — ABNORMAL LOW (ref 13.0–17.1)
LYMPH%: 37.3 % (ref 14.0–49.0)
MONO#: 1.1 10*3/uL — ABNORMAL HIGH (ref 0.1–0.9)
NEUT#: 2.8 10*3/uL (ref 1.5–6.5)
NEUT%: 42.6 % (ref 39.0–75.0)
Platelets: 96 10*3/uL — ABNORMAL LOW (ref 140–400)
RBC: 3.62 10*6/uL — ABNORMAL LOW (ref 4.20–5.82)
WBC: 6.7 10*3/uL (ref 4.0–10.3)

## 2012-09-22 LAB — LACTATE DEHYDROGENASE (CC13): LDH: 282 U/L — ABNORMAL HIGH (ref 125–245)

## 2012-09-22 NOTE — Patient Instructions (Signed)
We will schedule repeat lab and a CT scan then see you back in a few weeks

## 2012-09-22 NOTE — Telephone Encounter (Signed)
Gave pt appt for lab and ML , gave pt oral contrast for CT, gave pt oral contrast and telephone number to radiology

## 2012-09-24 NOTE — Progress Notes (Signed)
Hematology and Oncology Follow Up Visit  Ricardo Watkins [id number] 1931/02/21 79 y.o. 09/24/2012 12:20 PM   Principle Diagnosis: Encounter Diagnoses  Name Primary?  . Prostate ca Yes  . LYMPHOMA      Interim History:   Followup visit for this now 79 year old man initially diagnosed with nuclear grade 2, B-cell, non-Hodgkin's lymphoma in August 2000 and presented with bulky retroperitoneal and periaortic lymphadenopathy. He achieved a near complete response to 4 cycles of fludarabine, mitoxantrone, and dexamethasone. Due to significant myelosuppression, chemotherapy was stopped after 4 cycles. He was put on consolidation therapy with Rituxan weekly x4. All treatments were completed by 07/21/1999. He has had no signs of recurrent lymphoma since that time.   Over time he has developed other problems. He developed cancer of the prostate Gleason 3+3 right lobe, 3+ 4 left lobe, in May of 2007 treated with radioactive iodine seed implants.   He developed a modest fall in his platelet count 4 years ago consistent with chronic ITP. Platelets have stayed at or around 100,000.  He had a stroke in July 2012. There was an acute infarction in the genu and posterior limb of the right anterior capsule. 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   Although he has no new symptoms at this time, I note there has been a 2 g fall in his hemoglobin compared with last year. Additional laboratory studies done in anticipation of today's visit show a modest elevation of his LDH which is also a change from last year.      Medications: reviewed  Allergies:  Allergies  Allergen Reactions  . Glucophage (Metformin Hydrochloride)     Chest pain  . Zetia (Ezetimibe)     weakness  . Fenofibrate Rash  . Niacin-Lovastatin Er Rash    Review of Systems: Constitutional:   No constitutional symptoms Respiratory: No cough or dyspnea Cardiovascular:  No chest pain or  palpitations Gastrointestinal: No abdominal pain or change in bowel habit, no hematochezia, no melena Genito-Urinary: No urinary tract symptoms Musculoskeletal: No muscle bone or joint pain Neurologic: Some residual neurologic deficits with minimal partial expressive aphasia left face droop Skin: No rash or ecchymosis Remaining ROS negative.  Physical Exam: Blood pressure 130/70, pulse 58, temperature 96.8 F (36 C), temperature source Oral, resp. rate 20, height 5\' 9"  (1.753 m), weight 207 lb 6.4 oz (94.076 kg). Wt Readings from Last 3 Encounters:  09/22/12 207 lb 6.4 oz (94.076 kg)  07/07/12 202 lb (91.627 kg)  09/10/11 201 lb 11.2 oz (91.491 kg)     General appearance: Well-nourished Caucasian man  HENNT: Pharynx no erythema or exudate Lymph nodes: No cervical, supraclavicular, axillary, or inguinal adenopathy Breasts: Lungs: Clear to auscultation resonant to percussion Heart: Regular rhythm no murmur Abdomen: Soft, nontender, no mass, no organomegaly Rectal: Enlarged prostate. Stool brown and guaiac negative Extremities: No edema, no calf tenderness Vascular: No cyanosis. I was not able to appreciate a carotid bruit Neurologic: Left facial droop. Motor strength 5 over 5. Reflexes 1+ symmetric Skin: No rash or ecchymosis  Lab Results: Lab Results  Component Value Date   WBC 6.7 09/22/2012   HGB 12.3* 09/22/2012   HCT 37.2* 09/22/2012   MCV 102.7* 09/22/2012   PLT 96* 09/22/2012     Chemistry      Component Value Date/Time   NA 139 09/10/2011 1204   K 4.3 09/10/2011 1204   CL 103 09/10/2011 1204   CO2 27 09/10/2011 1204   BUN  19 09/10/2011 1204   CREATININE 1.64* 09/10/2011 1204      Component Value Date/Time   CALCIUM 8.7 09/10/2011 1204   ALKPHOS 83 09/10/2011 1204   AST 28 09/10/2011 1204   ALT 37 09/10/2011 1204   BILITOT 0.5 09/10/2011 1204    LDH: 282 PSA: 0.01   Impression and Plan: 1. Low-grade, B-cell, non-Hodgkin's lymphoma diagnosed July 2000, treated with a  combination of chemotherapy and Rituxan. He remains clinically free of disease at this time now out almost 14 years from diagnosis.  I am concerned with new  anemia and rising his LDH. He has always had macrocytic red cell indices likely due to previous chemotherapy effect but he now has had a 2 g fall in hemoglobin over one year. Plan: I'm going to get a CT scan of his chest abdomen and pelvis to look for any lymphoma recurrence.  2. Gleason 3+ 3 right lobe, 3 + 4 left lobe prostate cancer diagnosed May 2007; treated with radioactive seed implants. Current PSA undetectable at 0.01 09/22/12  3. Chronic ITP. Platelet count is stable at around 100,000.   4. Chronic renal insufficiency.   5. Essential hypertension.  6. Type 2 diabetes.  7. History of a left lower extremity DVT while being treated for lymphoma December 2000.  8. Right-sided thrombotic stroke with residual left-sided neurologic deficits in 01/18/2011  9. History of renal artery stenosis.  #10. Valvular heart disease with minimal changes in the aortic and mitral valves by echocardiogram     CC:.    Annia Belt, MD 4/6/201412:20 PM

## 2012-09-25 DIAGNOSIS — Z961 Presence of intraocular lens: Secondary | ICD-10-CM | POA: Diagnosis not present

## 2012-09-25 DIAGNOSIS — E119 Type 2 diabetes mellitus without complications: Secondary | ICD-10-CM | POA: Diagnosis not present

## 2012-09-27 ENCOUNTER — Ambulatory Visit (HOSPITAL_COMMUNITY)
Admission: RE | Admit: 2012-09-27 | Discharge: 2012-09-27 | Disposition: A | Discharge status: Home / Self Care | Payer: Medicare Other | Source: Ambulatory Visit | Attending: Oncology | Admitting: Oncology

## 2012-09-27 ENCOUNTER — Other Ambulatory Visit (HOSPITAL_BASED_OUTPATIENT_CLINIC_OR_DEPARTMENT_OTHER): Payer: Medicare Other

## 2012-09-27 DIAGNOSIS — C61 Malignant neoplasm of prostate: Secondary | ICD-10-CM

## 2012-09-27 DIAGNOSIS — N281 Cyst of kidney, acquired: Secondary | ICD-10-CM | POA: Insufficient documentation

## 2012-09-27 DIAGNOSIS — K5730 Diverticulosis of large intestine without perforation or abscess without bleeding: Secondary | ICD-10-CM | POA: Diagnosis not present

## 2012-09-27 DIAGNOSIS — C8589 Other specified types of non-Hodgkin lymphoma, extranodal and solid organ sites: Secondary | ICD-10-CM | POA: Insufficient documentation

## 2012-09-27 DIAGNOSIS — K4090 Unilateral inguinal hernia, without obstruction or gangrene, not specified as recurrent: Secondary | ICD-10-CM | POA: Insufficient documentation

## 2012-09-27 DIAGNOSIS — R0989 Other specified symptoms and signs involving the circulatory and respiratory systems: Secondary | ICD-10-CM | POA: Diagnosis not present

## 2012-09-27 LAB — COMPREHENSIVE METABOLIC PANEL (CC13)
AST: 19 U/L (ref 5–34)
Alkaline Phosphatase: 69 U/L (ref 40–150)
BUN: 25.6 mg/dL (ref 7.0–26.0)
Creatinine: 1.8 mg/dL — ABNORMAL HIGH (ref 0.7–1.3)

## 2012-09-29 ENCOUNTER — Telehealth: Payer: Self-pay | Admitting: *Deleted

## 2012-09-29 NOTE — Telephone Encounter (Signed)
Message left yest for return call & pt called back today & left vm to return call.  Call returned & informed of good CT results per Dr Beryle Beams.  Pt knows that he has an upcoming appt with Ned Card NP.

## 2012-09-29 NOTE — Telephone Encounter (Signed)
Message copied by Jesse Fall on Fri Sep 29, 2012 12:56 PM ------      Message from: Annia Belt      Created: Wed Sep 27, 2012  1:36 PM       Call pt:  CT looks good. No sign of lymphoma or other obvious cancer ------

## 2012-10-09 ENCOUNTER — Ambulatory Visit (INDEPENDENT_AMBULATORY_CARE_PROVIDER_SITE_OTHER): Payer: Medicare Other | Admitting: Family Medicine

## 2012-10-09 ENCOUNTER — Encounter: Payer: Self-pay | Admitting: Family Medicine

## 2012-10-09 VITALS — BP 110/60 | HR 58 | Temp 98.5°F | Resp 14 | Wt 207.0 lb

## 2012-10-09 DIAGNOSIS — E119 Type 2 diabetes mellitus without complications: Secondary | ICD-10-CM | POA: Diagnosis not present

## 2012-10-09 DIAGNOSIS — I2510 Atherosclerotic heart disease of native coronary artery without angina pectoris: Secondary | ICD-10-CM | POA: Diagnosis not present

## 2012-10-09 DIAGNOSIS — I10 Essential (primary) hypertension: Secondary | ICD-10-CM | POA: Diagnosis not present

## 2012-10-09 DIAGNOSIS — Z794 Long term (current) use of insulin: Secondary | ICD-10-CM | POA: Diagnosis not present

## 2012-10-09 DIAGNOSIS — E785 Hyperlipidemia, unspecified: Secondary | ICD-10-CM | POA: Diagnosis not present

## 2012-10-09 LAB — HEPATIC FUNCTION PANEL
ALT: 14 U/L (ref 0–53)
AST: 18 U/L (ref 0–37)
Albumin: 4 g/dL (ref 3.5–5.2)
Total Protein: 7 g/dL (ref 6.0–8.3)

## 2012-10-09 LAB — BASIC METABOLIC PANEL
BUN: 25 mg/dL — ABNORMAL HIGH (ref 6–23)
CO2: 27 mEq/L (ref 19–32)
Chloride: 105 mEq/L (ref 96–112)
Glucose, Bld: 80 mg/dL (ref 70–99)
Potassium: 4.2 mEq/L (ref 3.5–5.3)

## 2012-10-09 LAB — LIPID PANEL
Cholesterol: 163 mg/dL (ref 0–200)
VLDL: 40 mg/dL (ref 0–40)

## 2012-10-09 LAB — HEMOGLOBIN A1C: Mean Plasma Glucose: 131 mg/dL — ABNORMAL HIGH (ref <117)

## 2012-10-09 NOTE — Progress Notes (Signed)
Subjective:    Patient ID: Ricardo Watkins, male    DOB: 24-Feb-1931, 79 y.o.   MRN: [id number]  HPI  Problem #1 diabetes mellitus, insulin-dependent-his fasting blood sugars are ranging 70-100. His two-hour postprandial sugars are ranging 120-130. He is currently taking Lantus 25 units subcutaneous daily and Actos.  He denies any hypoglycemia. He denies any blurred vision, polyuria, polydipsia, evidence of peripheral neuropathy.  Problem #2 is hypertension. At last office visit we increased his Norvasc to 10 mg by mouth daily. His blood pressure today is 110/60. He denies any chest pain, shortness of breath, or dyspnea on exertion.  #3 is hyperlipidemia.  Patient is currently taking Lipitor 10 mg by mouth daily. He denies any myalgias or right upper quadrant pain.  Past Medical History  Diagnosis Date  . PVD (peripheral vascular disease)     rt renal artery stent  . CAD (coronary artery disease)     30% LAD Stenosis, 70% ramus intermedius stenosis, treated with PTCA and angioplasty by Dr Albertine Patricia 2004  . DM (diabetes mellitus)   . Dyslipidemia   . Renal insufficiency   . Thrombocytopenia   . Hypertension   . Vitamin D deficiency   . Non Hodgkin's lymphoma   . Inguinal hernia     right  . Adenomatous colon polyp   . Cataract     right eye  . Hyperlipidemia   . Prostate cancer     seed implants no recurrence  . 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 Prostate ca 09/10/2011    Gleason 3+3 R, 3+4 L lobe May 2007 Rx Radioactive seed implants Dr [name]  . DVT (deep venous thrombosis)     secondary to surgery  . UTI (urinary tract infection)    Current Outpatient Prescriptions on File Prior to Visit  Medication Sig Dispense Refill  . acetaminophen (TYLENOL) 500 MG tablet Take 500 mg by mouth every 6 (six) hours as needed for pain.      Marland Kitchen atorvastatin (LIPITOR) 10 MG  tablet Take 5 mg by mouth daily. Takes 1/2 of 10mg  tablet      . clopidogrel (PLAVIX) 75 MG tablet Take 1 tablet (75 mg total) by mouth daily.  30 tablet  6  . diphenhydrAMINE (BENADRYL) 25 mg capsule Take 25 mg by mouth at bedtime as needed for itching.      . insulin glargine (LANTUS) 100 UNIT/ML injection Inject 25 Units into the skin at bedtime.       . metoprolol (LOPRESSOR) 100 MG tablet Take 100 mg by mouth daily.       Marland Kitchen oxybutynin (DITROPAN-XL) 5 MG 24 hr tablet Take 10 mg by mouth at bedtime.       . pioglitazone (ACTOS) 30 MG tablet Take 30 mg by mouth daily.      . quinapril (ACCUPRIL) 40 MG tablet Take 40 mg by mouth at bedtime.        . Tamsulosin HCl (FLOMAX) 0.4 MG CAPS Take 0.4 mg by mouth daily.        . [DISCONTINUED] cloNIDine (CATAPRES) 0.1 MG tablet Take 0.1 mg by mouth 2 (two) times daily.         No current facility-administered medications on file prior to visit.   History   Social History  . Marital Status: Married    Spouse Name: N/A  Number of Children: N/A  . Years of Education: N/A   Occupational History  . Not on file.   Social History Main Topics  . Smoking status: Former Research scientist (life sciences)  . Smokeless tobacco: Never Used     Comment: Quit 30 years ago.  . Alcohol Use: No  . Drug Use: No  . Sexually Active: Yes -- Male partner(s)   Other Topics Concern  . Not on file   Social History Narrative  . No narrative on file     Review of Systems    review of systems is otherwise negative Objective:   Physical Exam  Constitutional: He appears well-developed and well-nourished.  Eyes: Conjunctivae and EOM are normal. Pupils are equal, round, and reactive to light.  Neck: Normal range of motion. No JVD present. No thyromegaly present.  Cardiovascular: Normal rate, normal heart sounds and intact distal pulses.  Exam reveals no gallop.   No murmur heard. Pulses:      Dorsalis pedis pulses are 1+ on the right side, and 1+ on the left side.        Posterior tibial pulses are 1+ on the right side, and 1+ on the left side.  Pulmonary/Chest: Effort normal and breath sounds normal. No respiratory distress. He has no wheezes. He has no rales. He exhibits no tenderness.  Abdominal: Soft. Bowel sounds are normal. He exhibits no distension and no mass. There is no tenderness. There is no rebound and no guarding.  Lymphadenopathy:    He has no cervical adenopathy.   Sensation is 4 over 4-10 mg monofilament bilaterally       Assessment & Plan:  1. CAD (coronary artery disease) Continue Plavix 75 mg by mouth daily. He is currently  2. IDDM (insulin dependent diabetes mellitus) Blood sugars look excellent. Patient's car A1c is less than 7 - Hemoglobin A1c  3. HTN (hypertension) Blood pressure is currently well controlled. - Basic Metabolic Panel - Hepatic Function Panel  4. HLD (hyperlipidemia) Patient's goal LDL is less than 70. - Hepatic Function Panel - Lipid Panel

## 2012-10-11 ENCOUNTER — Ambulatory Visit (HOSPITAL_BASED_OUTPATIENT_CLINIC_OR_DEPARTMENT_OTHER): Payer: Medicare Other | Admitting: Nurse Practitioner

## 2012-10-11 ENCOUNTER — Ambulatory Visit (HOSPITAL_BASED_OUTPATIENT_CLINIC_OR_DEPARTMENT_OTHER): Payer: Medicare Other | Admitting: Lab

## 2012-10-11 ENCOUNTER — Telehealth: Payer: Self-pay | Admitting: Oncology

## 2012-10-11 VITALS — BP 137/69 | HR 68 | Temp 96.8°F | Resp 17 | Ht 69.0 in | Wt 207.6 lb

## 2012-10-11 DIAGNOSIS — C61 Malignant neoplasm of prostate: Secondary | ICD-10-CM

## 2012-10-11 DIAGNOSIS — C8590 Non-Hodgkin lymphoma, unspecified, unspecified site: Secondary | ICD-10-CM

## 2012-10-11 DIAGNOSIS — C8589 Other specified types of non-Hodgkin lymphoma, extranodal and solid organ sites: Secondary | ICD-10-CM

## 2012-10-11 DIAGNOSIS — D539 Nutritional anemia, unspecified: Secondary | ICD-10-CM

## 2012-10-11 LAB — CBC & DIFF AND RETIC
Basophils Absolute: 0 10*3/uL (ref 0.0–0.1)
EOS%: 1.8 % (ref 0.0–7.0)
HGB: 13.6 g/dL (ref 13.0–17.1)
LYMPH%: 33.6 % (ref 14.0–49.0)
MCH: 33.4 pg (ref 27.2–33.4)
MCV: 102.9 fL — ABNORMAL HIGH (ref 79.3–98.0)
MONO%: 27.7 % — ABNORMAL HIGH (ref 0.0–14.0)
Platelets: 97 10*3/uL — ABNORMAL LOW (ref 140–400)
RBC: 4.07 10*6/uL — ABNORMAL LOW (ref 4.20–5.82)
RDW: 16.8 % — ABNORMAL HIGH (ref 11.0–14.6)
Retic %: 3.19 % — ABNORMAL HIGH (ref 0.80–1.80)

## 2012-10-11 NOTE — Progress Notes (Signed)
OFFICE PROGRESS NOTE  Interval history:  Ricardo Watkins returns as scheduled. To review, he is an 79 year old man diagnosed with grade 2 B-cell non-Hodgkin's lymphoma in  2000 when he presented with bulky retroperitoneal and periaortic lymphadenopathy. He achieved a near complete response to 4 cycles of fludarabine, mitoxantrone and dexamethasone. Due to significant myelosuppression the chemotherapy was discontinued after 4 cycles and he was put on consolidation therapy with Rituxan weekly x4. All treatments were completed by 07/21/1999. He has had no signs of recurrent lymphoma. He was diagnosed with prostate cancer in May 2007 treated with radioactive iodine seed implants.  He was seen by Dr. Beryle Beams on 09/24/2012 for routine followup. His hemoglobin was noted to have fallen by approximately 2 g over the past year and the LDH was mildly elevated. He was referred for CT scans on 09/27/2012 which showed no evidence of lymphoma recurrence. Of note, stool guaiac was negative on 09/24/2012.  Ricardo Watkins reports that overall he feels well. He has a good appetite. No change in bowel habits. No bloody or black stools. His last colonoscopy was approximately 5 years ago. He denies hematuria. No fevers. He has chronic intermittent night sweats.   Objective: Blood pressure 137/69, pulse 68, temperature 96.8 F (36 C), temperature source Oral, resp. rate 17, height 5\' 9"  (1.753 m), weight 207 lb 9.6 oz (94.167 kg).  Oropharynx is without thrush or ulceration. Lungs are clear. Regular cardiac rhythm. Abdomen soft and nontender. No organomegaly. Pitting edema at the lower legs bilaterally. Left facial droop.  Lab Results: Lab Results  Component Value Date   WBC 6.7 09/22/2012   HGB 12.3* 09/22/2012   HCT 37.2* 09/22/2012   MCV 102.7* 09/22/2012   PLT 96* 09/22/2012    Chemistry:    Chemistry      Component Value Date/Time   NA 143 10/09/2012 0911   NA 142 09/27/2012 0940   K 4.2 10/09/2012 0911   K 4.3 09/27/2012  0940   CL 105 10/09/2012 0911   CL 107 09/27/2012 0940   CO2 27 10/09/2012 0911   CO2 25 09/27/2012 0940   BUN 25* 10/09/2012 0911   BUN 25.6 09/27/2012 0940   CREATININE 1.75* 10/09/2012 0911   CREATININE 1.8* 09/27/2012 0940   CREATININE 1.64* 09/10/2011 1204      Component Value Date/Time   CALCIUM 9.4 10/09/2012 0911   CALCIUM 9.7 09/27/2012 0940   ALKPHOS 58 10/09/2012 0911   ALKPHOS 69 09/27/2012 0940   AST 18 10/09/2012 0911   AST 19 09/27/2012 0940   ALT 14 10/09/2012 0911   ALT 15 09/27/2012 0940   BILITOT 0.4 10/09/2012 0911   BILITOT 0.43 09/27/2012 0940       Studies/Results: Ct Abdomen Pelvis Wo Contrast  09/27/2012  *RADIOLOGY REPORT*  Clinical Data:  Treated lymphoma, rising LDH  CT CHEST, ABDOMEN AND PELVIS WITH CONTRAST  Technique:  Multidetector CT imaging of the chest, abdomen and pelvis was performed following the standard protocol during bolus administration of intravenous contrast.  Contrast:  None  Comparison:  CT 07/16/1998  CT CHEST  Findings:  No axillary or supraclavicular lymphadenopathy.  No mediastinal or hilar lymphadenopathy.  No pericardial fluid. Review of the lung parenchyma demonstrates no pulmonary masses or nodules.  No pleural fluid.  Airways are normal.  IMPRESSION: No evidence of lymphoma recurrence in the thorax.  CT ABDOMEN AND PELVIS  Findings:  Non-IV contrast images demonstrate no focal hepatic lesion.  The spleen is normal volume.  The pancreas  and gallbladder are normal.  The adrenal glands and kidneys demonstrate no acute findings.  There is a low density cyst extending from the right kidney which cannot be fully characterized measuring 11 mm (image 62).  Ricardo Watkins this representing a benign cyst.  The stomach, small bowel, cecum are normal.  There are diverticula of the descending colon sigmoid colon.  There is no retroperitoneal  or periportal lymphadenopathy.  No mesenteric adenopathy.  No free fluid the pelvis.  Brachytherapy seeds in the prostate gland.  No pelvic  lymphadenopathy.  Large fat filled right inguinal hernia measuring 7.3 cm in diameter.  This increase in diameter from 5.4 cm on prior. Review of  bone windows demonstrates no aggressive osseous lesions.  IMPRESSION:  1.  No evidence of lymphadenopathy in the abdomen  or pelvis.  2.  Normal volume spleen. 3.  Sigmoid diverticulosis. 4.  Large fat filled right inguinal hernia expands the inguinal canal. 5.  Low density cyst extend from the right kidney.   Original Report Authenticated By: Suzy Bouchard, M.D.    Ct Chest Wo Contrast  09/27/2012  *RADIOLOGY REPORT*  Clinical Data:  Treated lymphoma, rising LDH  CT CHEST, ABDOMEN AND PELVIS WITH CONTRAST  Technique:  Multidetector CT imaging of the chest, abdomen and pelvis was performed following the standard protocol during bolus administration of intravenous contrast.  Contrast:  None  Comparison:  CT 07/16/1998  CT CHEST  Findings:  No axillary or supraclavicular lymphadenopathy.  No mediastinal or hilar lymphadenopathy.  No pericardial fluid. Review of the lung parenchyma demonstrates no pulmonary masses or nodules.  No pleural fluid.  Airways are normal.  IMPRESSION: No evidence of lymphoma recurrence in the thorax.  CT ABDOMEN AND PELVIS  Findings:  Non-IV contrast images demonstrate no focal hepatic lesion.  The spleen is normal volume.  The pancreas and gallbladder are normal.  The adrenal glands and kidneys demonstrate no acute findings.  There is a low density cyst extending from the right kidney which cannot be fully characterized measuring 11 mm (image 62).  Ricardo Watkins this representing a benign cyst.  The stomach, small bowel, cecum are normal.  There are diverticula of the descending colon sigmoid colon.  There is no retroperitoneal  or periportal lymphadenopathy.  No mesenteric adenopathy.  No free fluid the pelvis.  Brachytherapy seeds in the prostate gland.  No pelvic lymphadenopathy.  Large fat filled right inguinal hernia measuring 7.3 cm in diameter.   This increase in diameter from 5.4 cm on prior. Review of  bone windows demonstrates no aggressive osseous lesions.  IMPRESSION:  1.  No evidence of lymphadenopathy in the abdomen  or pelvis.  2.  Normal volume spleen. 3.  Sigmoid diverticulosis. 4.  Large fat filled right inguinal hernia expands the inguinal canal. 5.  Low density cyst extend from the right kidney.   Original Report Authenticated By: Suzy Bouchard, M.D.     Medications: I have reviewed the patient's current medications.  Assessment/Plan:  1. Low-grade B-cell non-Hodgkin's lymphoma diagnosed in 2000 treated with 4 cycles of fludarabine, mitoxantrone and dexamethasone followed by consolidation therapy with Rituxan. Restaging CT scans 09/27/2012 negative for evidence of lymphoma recurrence. 2. Mild macrocytic anemia. 3. Mildly elevated LDH. 4. Gleason 3+3 right lobe, 3+4 left lobe prostate cancer diagnosed May 2007 treated with radioactive seed implants. Most recent PSA 0.01 on 09/22/2012. 5. Chronic ITP. 6. Chronic renal insufficiency. 7. Essential hypertension. 8. Type 2 diabetes. 9. History of left lower extremity DVT while being treated  for lymphoma December 2000. 10. Right-sided thrombotic stroke with residual left sided neurologic deficits 01/18/2011. 11. History of renal artery stenosis. 12. Valvular heart disease.  Disposition-the etiology of the anemia is unclear. He will return to the lab today for a repeat CBC, LDH, 123456, folic acid, iron studies and serum protein electrophoresis. He will return for a followup visit in one month.  Plan reviewed with Dr. Beryle Beams.  Ned Card ANP/GNP-BC

## 2012-10-11 NOTE — Telephone Encounter (Signed)
gv and pritned appt sched and avs for May for pt.

## 2012-10-13 ENCOUNTER — Telehealth: Payer: Self-pay | Admitting: *Deleted

## 2012-10-13 LAB — IRON AND TIBC
%SAT: 22 % (ref 20–55)
TIBC: 333 ug/dL (ref 215–435)

## 2012-10-13 LAB — PROTEIN ELECTROPHORESIS, SERUM
Beta 2: 6.8 % — ABNORMAL HIGH (ref 3.2–6.5)
Gamma Globulin: 14.8 % (ref 11.1–18.8)

## 2012-10-13 LAB — FERRITIN: Ferritin: 317 ng/mL (ref 22–322)

## 2012-10-13 NOTE — Telephone Encounter (Signed)
Pt returned call & informed of lab results per Dr Beryle Beams.  He understands that his Vit B-12 is low & needs B-12 injections but wants to know if this can be done closer to home at Dr. Peri Jefferson office who is his PCP.  Informed that we could check on this & see if Dr. Dennard Schaumann is willing & get back with him.  Will route labs & note to this effect to Dr Margaretmary Eddy.

## 2012-10-13 NOTE — Telephone Encounter (Signed)
Unable to route note to Dr Dennard Schaumann via computer therefore will try to fax a note or call his office.  Called pt back & Dr. Dennard Schaumann is at [place] # is [phone].  Called & left message to call us back.  Talked with [facility] & Dr Azucena Freed note sent via computer to Dr Cletus Gash T. Pickard with request to be done at his office so that he doesn't have to travel as far & to call if concerns/questions, etc. Jeannie/Dr.Pickard's call back # [phone].

## 2012-10-13 NOTE — Telephone Encounter (Signed)
Message copied by Jesse Fall on Fri Oct 13, 2012  3:15 PM ------      Message from: Annia Belt      Created: Thu Oct 12, 2012  9:02 AM       FYI: his Hb was up to 13.6!  Iron studies normal  B12 a little low - could start  B12  1 mg IM wekly x 4 then monthly and see if he responds; no need for colonoscopy   G ------

## 2012-10-18 ENCOUNTER — Telehealth: Payer: Self-pay | Admitting: *Deleted

## 2012-10-18 DIAGNOSIS — R351 Nocturia: Secondary | ICD-10-CM | POA: Diagnosis not present

## 2012-10-18 DIAGNOSIS — N320 Bladder-neck obstruction: Secondary | ICD-10-CM | POA: Diagnosis not present

## 2012-10-18 DIAGNOSIS — K4090 Unilateral inguinal hernia, without obstruction or gangrene, not specified as recurrent: Secondary | ICD-10-CM | POA: Diagnosis not present

## 2012-10-18 DIAGNOSIS — Z8546 Personal history of malignant neoplasm of prostate: Secondary | ICD-10-CM | POA: Diagnosis not present

## 2012-10-18 NOTE — Telephone Encounter (Signed)
Notified pt that Dr Beryle Beams was OK with getting Vit B12 inj at Dr Viona Gilmore. Ricardo Watkins office & suggested he call to set this up.  Ricardo Watkins states that he already has set this up & is going tomorrow.

## 2012-10-18 NOTE — Telephone Encounter (Signed)
Message copied by Jesse Fall on Wed Oct 18, 2012  4:22 PM ------      Message from: Annia Belt      Created: Sun Oct 15, 2012  5:28 PM      Regarding: RE: B-12 injections        Fine with me      ----- Message -----         From: Jesse Fall, RN         Sent: 10/13/2012   4:27 PM           To: [name], MD, Annia Belt, MD      Subject: B-12 injections                                          Dr. Dennard Schaumann, The pt wants to know if he can get his Vit. B-12 injections at your office so that he doesn't have to travel as far.  I spoke with Jeannie & she thought this would be OK.  If so, will you arrange this with the pt?  Please call if you have questions or concerns.  Thank you per Dr. Lucilla Edin RN      ----- Message -----         From: Annia Belt, MD         Sent: 10/12/2012   9:02 AM           To: Ignacia Felling, RN, Jesse Fall, RN, #            FYI: his Hb was up to 13.6!  Iron studies normal  B12 a little low - could start  B12  1 mg IM wekly x 4 then monthly and see if he responds; no need for colonoscopy   G             ------

## 2012-10-18 NOTE — Progress Notes (Signed)
Patient aware.

## 2012-10-19 ENCOUNTER — Ambulatory Visit (INDEPENDENT_AMBULATORY_CARE_PROVIDER_SITE_OTHER): Payer: Medicare Other | Admitting: Family Medicine

## 2012-10-19 DIAGNOSIS — D630 Anemia in neoplastic disease: Secondary | ICD-10-CM

## 2012-10-19 MED ORDER — CYANOCOBALAMIN 1000 MCG/ML IJ SOLN
1000.0000 ug | Freq: Once | INTRAMUSCULAR | Status: AC
  Administered 2012-10-19: 1000 ug via INTRAMUSCULAR

## 2012-10-20 ENCOUNTER — Telehealth: Payer: Self-pay | Admitting: Physician Assistant

## 2012-10-20 MED ORDER — PIOGLITAZONE HCL 30 MG PO TABS: 30.0000 mg | ORAL_TABLET | Freq: Every day | ORAL | Status: DC

## 2012-10-20 NOTE — Telephone Encounter (Signed)
Medication refilled per protocol. 

## 2012-10-26 ENCOUNTER — Ambulatory Visit (INDEPENDENT_AMBULATORY_CARE_PROVIDER_SITE_OTHER): Payer: Medicare Other | Admitting: *Deleted

## 2012-10-26 DIAGNOSIS — E539 Vitamin B deficiency, unspecified: Secondary | ICD-10-CM | POA: Diagnosis not present

## 2012-10-26 MED ORDER — CYANOCOBALAMIN 1000 MCG/ML IJ SOLN
1000.0000 ug | Freq: Once | INTRAMUSCULAR | Status: AC
  Administered 2012-10-26: 1000 ug via INTRAMUSCULAR

## 2012-11-02 ENCOUNTER — Ambulatory Visit (INDEPENDENT_AMBULATORY_CARE_PROVIDER_SITE_OTHER): Payer: Medicare Other | Admitting: Family Medicine

## 2012-11-02 DIAGNOSIS — D630 Anemia in neoplastic disease: Secondary | ICD-10-CM | POA: Diagnosis not present

## 2012-11-02 MED ORDER — CYANOCOBALAMIN 1000 MCG/ML IJ SOLN
1000.0000 ug | Freq: Once | INTRAMUSCULAR | Status: AC
  Administered 2012-11-02: 1000 ug via INTRAMUSCULAR

## 2012-11-07 ENCOUNTER — Other Ambulatory Visit: Payer: Self-pay | Admitting: Family Medicine

## 2012-11-07 NOTE — Telephone Encounter (Signed)
Medication refilled per protocol. 

## 2012-11-08 ENCOUNTER — Telehealth: Payer: Self-pay | Admitting: Oncology

## 2012-11-08 ENCOUNTER — Other Ambulatory Visit (HOSPITAL_BASED_OUTPATIENT_CLINIC_OR_DEPARTMENT_OTHER): Payer: Medicare Other | Admitting: Lab

## 2012-11-08 ENCOUNTER — Ambulatory Visit (HOSPITAL_BASED_OUTPATIENT_CLINIC_OR_DEPARTMENT_OTHER): Payer: Medicare Other | Admitting: Nurse Practitioner

## 2012-11-08 VITALS — BP 123/74 | HR 55 | Temp 97.0°F | Resp 18 | Ht 69.0 in | Wt 211.1 lb

## 2012-11-08 DIAGNOSIS — Z87898 Personal history of other specified conditions: Secondary | ICD-10-CM

## 2012-11-08 DIAGNOSIS — D539 Nutritional anemia, unspecified: Secondary | ICD-10-CM | POA: Diagnosis not present

## 2012-11-08 DIAGNOSIS — C8590 Non-Hodgkin lymphoma, unspecified, unspecified site: Secondary | ICD-10-CM

## 2012-11-08 DIAGNOSIS — E538 Deficiency of other specified B group vitamins: Secondary | ICD-10-CM

## 2012-11-08 DIAGNOSIS — Z8546 Personal history of malignant neoplasm of prostate: Secondary | ICD-10-CM | POA: Diagnosis not present

## 2012-11-08 LAB — CBC & DIFF AND RETIC
BASO%: 0.1 % (ref 0.0–2.0)
HCT: 39.4 % (ref 38.4–49.9)
Immature Retic Fract: 22.3 % — ABNORMAL HIGH (ref 3.00–10.60)
MCHC: 32.2 g/dL (ref 32.0–36.0)
MONO#: 0.9 10*3/uL (ref 0.1–0.9)
NEUT%: 41.6 % (ref 39.0–75.0)
Retic %: 2.28 % — ABNORMAL HIGH (ref 0.80–1.80)
WBC: 6.8 10*3/uL (ref 4.0–10.3)
lymph#: 2.8 10*3/uL (ref 0.9–3.3)

## 2012-11-08 NOTE — Progress Notes (Signed)
OFFICE PROGRESS NOTE  Interval history:   Ricardo Watkins returns as scheduled. To review, he is an 79 year old man diagnosed with grade 2 B-cell non-Hodgkin's lymphoma in 2000 when he presented with bulky retroperitoneal and periaortic lymphadenopathy. He achieved a near complete response to 4 cycles of fludarabine, mitoxantrone and dexamethasone. Due to significant myelosuppression the chemotherapy was discontinued after 4 cycles and he was put on consolidation therapy with Rituxan weekly x4. All treatments were completed by 07/21/1999. He has had no signs of recurrent lymphoma. He was diagnosed with prostate cancer in May 2007 treated with radioactive iodine seed implants.  He was seen by Dr. Beryle Beams on 09/24/2012 for routine followup. His hemoglobin was noted to have fallen by approximately 2 g over the past year and the LDH was mildly elevated. He was referred for CT scans on 09/27/2012 which showed no evidence of lymphoma recurrence. Of note, stool guaiac was negative on 09/24/2012.  Repeat CBC on 10/11/2012 showed a hemoglobin of 13.6, MCV 102.9, platelets stable at 97,000. Additional labs from 10/11/2012 showed iron 74, UIBC 259, TIBC 333, percent saturation 22, ferritin 317, RBC folate 566, no M spike detected on serum protein electrophoresis, B12 low at 194.  He is seen today for scheduled followup. He reports weekly B12 injections were initiated approximately 3 weeks ago. After 4 weekly injections the schedule will be adjusted to monthly. No fevers or sweats. He has good appetite. His weight is stable. He denies numbness or tingling in his hands or feet.   Objective: Blood pressure 123/74, pulse 55, temperature 97 F (36.1 C), temperature source Oral, resp. rate 18, height 5\' 9"  (1.753 m), weight 211 lb 1.6 oz (95.754 kg).  No thrush or ulcerations. No palpable cervical, supraclavicular or axillary lymph nodes. Lungs are clear. Regular cardiac rhythm. Abdomen is soft and nontender. No  organomegaly. 1+ edema below the knees bilaterally.  Lab Results: Lab Results  Component Value Date   WBC 6.8 11/08/2012   HGB 12.7* 11/08/2012   HCT 39.4 11/08/2012   MCV 104.5* 11/08/2012   PLT 108* 11/08/2012    Chemistry:    Chemistry      Component Value Date/Time   NA 143 10/09/2012 0911   NA 142 09/27/2012 0940   K 4.2 10/09/2012 0911   K 4.3 09/27/2012 0940   CL 105 10/09/2012 0911   CL 107 09/27/2012 0940   CO2 27 10/09/2012 0911   CO2 25 09/27/2012 0940   BUN 25* 10/09/2012 0911   BUN 25.6 09/27/2012 0940   CREATININE 1.75* 10/09/2012 0911   CREATININE 1.8* 09/27/2012 0940   CREATININE 1.64* 09/10/2011 1204      Component Value Date/Time   CALCIUM 9.4 10/09/2012 0911   CALCIUM 9.7 09/27/2012 0940   ALKPHOS 58 10/09/2012 0911   ALKPHOS 69 09/27/2012 0940   AST 18 10/09/2012 0911   AST 19 09/27/2012 0940   ALT 14 10/09/2012 0911   ALT 15 09/27/2012 0940   BILITOT 0.4 10/09/2012 0911   BILITOT 0.43 09/27/2012 0940       Studies/Results: No results found.  Medications: I have reviewed the patient's current medications.  Assessment/Plan:  1. Low-grade B-cell non-Hodgkin's lymphoma diagnosed in 2000 treated with 4 cycles of fludarabine, mitoxantrone and dexamethasone followed by consolidation therapy with Rituxan. Restaging CT scans 09/27/2012 negative for evidence of lymphoma recurrence. 2. Mild macrocytic anemia. 3. B12 deficiency 10/11/2012. B12 injections initiated. 4. Mildly elevated LDH. 5. Gleason 3+3 right lobe, 3+4 left lobe prostate cancer diagnosed May  2007 treated with radioactive seed implants. Most recent PSA 0.01 on 09/22/2012. 6. Chronic ITP. 7. Chronic renal insufficiency. 8. Essential hypertension. 9. Type 2 diabetes. 10. History of left lower extremity DVT while being treated for lymphoma December 2000. 11. Right-sided thrombotic stroke with residual left sided neurologic deficits 01/18/2011. 12. History of renal artery stenosis. 13. Valvular heart  disease.  Disposition-Mr. Sivils appears stable. He continues to have a mild macrocytic anemia. He will continue the B12 injections through his primary provider's office. He will return for a CBC in 2 months and a followup visit in 4 months. He will contact the office in the interim with any problems.  Plan reviewed with Dr. Beryle Beams.  Ned Card ANP/GNP-BC

## 2012-11-09 ENCOUNTER — Ambulatory Visit (INDEPENDENT_AMBULATORY_CARE_PROVIDER_SITE_OTHER): Payer: Medicare Other | Admitting: *Deleted

## 2012-11-09 DIAGNOSIS — E538 Deficiency of other specified B group vitamins: Secondary | ICD-10-CM | POA: Diagnosis not present

## 2012-11-09 MED ORDER — CYANOCOBALAMIN 1000 MCG/ML IJ SOLN: 1000.0000 ug | Freq: Once | INTRAMUSCULAR | Status: DC

## 2012-12-05 ENCOUNTER — Telehealth: Payer: Self-pay | Admitting: Physician Assistant

## 2012-12-05 MED ORDER — METOPROLOL TARTRATE 100 MG PO TABS: 100.0000 mg | ORAL_TABLET | Freq: Every day | ORAL | Status: DC

## 2012-12-05 MED ORDER — INSULIN GLARGINE 100 UNIT/ML SOLOSTAR PEN: 25.0000 [IU] | PEN_INJECTOR | Freq: Every day | SUBCUTANEOUS | Status: DC

## 2012-12-05 MED ORDER — ATORVASTATIN CALCIUM 10 MG PO TABS: 5.0000 mg | ORAL_TABLET | Freq: Every day | ORAL | Status: DC

## 2012-12-05 NOTE — Telephone Encounter (Signed)
Medication refilled per protocol.  Pt has follow up appt in July

## 2012-12-08 ENCOUNTER — Telehealth: Payer: Self-pay | Admitting: Family Medicine

## 2012-12-08 MED ORDER — METOPROLOL SUCCINATE ER 100 MG PO TB24: 100.0000 mg | ORAL_TABLET | Freq: Every day | ORAL | Status: DC

## 2012-12-08 NOTE — Telephone Encounter (Signed)
Pharmacy is questioning the Metoprolol.  Records show back and forth between the succinate and tartrate.  Which is correct?  Both in chart.  Unable to contact patient, left mess for him to call me back.

## 2012-12-08 NOTE — Telephone Encounter (Signed)
Pt has been informed about medication correction.

## 2012-12-08 NOTE — Telephone Encounter (Signed)
I want the patient on Toprol XL 100 mg poqday and not lopressor.

## 2012-12-08 NOTE — Telephone Encounter (Signed)
Med list corrected and pharmacy was called.  New rx sent for Toprol xl 100mg  QD.  Pt called lmtcb

## 2012-12-08 NOTE — Telephone Encounter (Signed)
Did speak with family.  He has the Lopressor 100mg  at home tha he takes QD.  States that is what he has had??

## 2012-12-12 ENCOUNTER — Ambulatory Visit (INDEPENDENT_AMBULATORY_CARE_PROVIDER_SITE_OTHER): Payer: Medicare Other | Admitting: Family Medicine

## 2012-12-12 DIAGNOSIS — E538 Deficiency of other specified B group vitamins: Secondary | ICD-10-CM

## 2012-12-12 MED ORDER — CYANOCOBALAMIN 1000 MCG/ML IJ SOLN
1000.0000 ug | Freq: Once | INTRAMUSCULAR | Status: AC
  Administered 2012-12-12: 1000 ug via INTRAMUSCULAR

## 2013-01-08 ENCOUNTER — Other Ambulatory Visit (HOSPITAL_BASED_OUTPATIENT_CLINIC_OR_DEPARTMENT_OTHER): Payer: Medicare Other | Admitting: Lab

## 2013-01-08 DIAGNOSIS — D539 Nutritional anemia, unspecified: Secondary | ICD-10-CM

## 2013-01-08 DIAGNOSIS — C8589 Other specified types of non-Hodgkin lymphoma, extranodal and solid organ sites: Secondary | ICD-10-CM | POA: Diagnosis not present

## 2013-01-08 DIAGNOSIS — C8590 Non-Hodgkin lymphoma, unspecified, unspecified site: Secondary | ICD-10-CM

## 2013-01-08 LAB — CBC WITH DIFFERENTIAL/PLATELET
Basophils Absolute: 0 10*3/uL (ref 0.0–0.1)
EOS%: 3 % (ref 0.0–7.0)
HGB: 13.5 g/dL (ref 13.0–17.1)
LYMPH%: 37.2 % (ref 14.0–49.0)
MCH: 35.2 pg — ABNORMAL HIGH (ref 27.2–33.4)
MCV: 105.7 fL — ABNORMAL HIGH (ref 79.3–98.0)
MONO%: 14.7 % — ABNORMAL HIGH (ref 0.0–14.0)
NEUT%: 44.6 % (ref 39.0–75.0)
Platelets: 100 10*3/uL — ABNORMAL LOW (ref 140–400)
RDW: 17.3 % — ABNORMAL HIGH (ref 11.0–14.6)

## 2013-01-09 ENCOUNTER — Ambulatory Visit (INDEPENDENT_AMBULATORY_CARE_PROVIDER_SITE_OTHER): Payer: Medicare Other | Admitting: Family Medicine

## 2013-01-09 ENCOUNTER — Encounter: Payer: Self-pay | Admitting: Family Medicine

## 2013-01-09 VITALS — BP 110/68 | HR 62 | Temp 97.8°F | Resp 16 | Wt 216.0 lb

## 2013-01-09 DIAGNOSIS — E538 Deficiency of other specified B group vitamins: Secondary | ICD-10-CM

## 2013-01-09 DIAGNOSIS — I10 Essential (primary) hypertension: Secondary | ICD-10-CM

## 2013-01-09 DIAGNOSIS — E119 Type 2 diabetes mellitus without complications: Secondary | ICD-10-CM | POA: Diagnosis not present

## 2013-01-09 DIAGNOSIS — R252 Cramp and spasm: Secondary | ICD-10-CM

## 2013-01-09 DIAGNOSIS — E785 Hyperlipidemia, unspecified: Secondary | ICD-10-CM

## 2013-01-09 LAB — COMPLETE METABOLIC PANEL WITH GFR
ALT: 15 U/L (ref 0–53)
Albumin: 4 g/dL (ref 3.5–5.2)
CO2: 26 mEq/L (ref 19–32)
Calcium: 9.2 mg/dL (ref 8.4–10.5)
Chloride: 106 mEq/L (ref 96–112)
GFR, Est African American: 44 mL/min — ABNORMAL LOW
GFR, Est Non African American: 38 mL/min — ABNORMAL LOW
Glucose, Bld: 83 mg/dL (ref 70–99)
Potassium: 4.3 mEq/L (ref 3.5–5.3)
Sodium: 141 mEq/L (ref 135–145)
Total Protein: 6.8 g/dL (ref 6.0–8.3)

## 2013-01-09 LAB — LIPID PANEL
LDL Cholesterol: 81 mg/dL (ref 0–99)
Total CHOL/HDL Ratio: 4.1 Ratio
VLDL: 40 mg/dL (ref 0–40)

## 2013-01-09 MED ORDER — CYANOCOBALAMIN 1000 MCG/ML IJ SOLN
1000.0000 ug | Freq: Once | INTRAMUSCULAR | Status: AC
  Administered 2013-01-09: 1000 ug via INTRAMUSCULAR

## 2013-01-09 NOTE — Progress Notes (Signed)
Subjective:    Patient ID: Ricardo Watkins, male    DOB: Dec 30, 1930, 79 y.o.   MRN: [id number]  HPI 10/09/12  Problem #1 diabetes mellitus, insulin-dependent-his fasting blood sugars are ranging 70-100. His two-hour postprandial sugars are ranging 120-130. He is currently taking Lantus 25 units subcutaneous daily and Actos.  He denies any hypoglycemia. He denies any blurred vision, polyuria, polydipsia, evidence of peripheral neuropathy.  Problem #2 is hypertension. At last office visit we increased his Norvasc to 10 mg by mouth daily. His blood pressure today is 110/60. He denies any chest pain, shortness of breath, or dyspnea on exertion.  #3 is hyperlipidemia.  Patient is currently taking Lipitor 10 mg by mouth daily. He denies any myalgias or right upper quadrant pain. At that time, my plan was: 1. CAD (coronary artery disease) Continue Plavix 75 mg by mouth daily. He is currently asymptomatic.  2. IDDM (insulin dependent diabetes mellitus) Blood sugars look excellent. Patient's goal A1c is less than 7 - Hemoglobin A1c  3. HTN (hypertension) Blood pressure is currently well controlled. - Basic Metabolic Panel - Hepatic Function Panel  4. HLD (hyperlipidemia) Patient's goal LDL is less than 70. - Hepatic Function Panel - Lipid Panel  01/09/13 Patient is here today for recheck. His blood sugars are extremely well controlled. He is diligent about checking them. His fasting blood sugars range between 85 and 112. His two-hour postprandial sugars are ranging between 83 and 110. His nighttime blood sugars are between 101 112. He is having no hypoglycemic spells. He denies polyuria, polydipsia, or blurred vision. He denies peripheral neuropathy.  He denies chest pain, shortness of breath or dyspnea on exertion. His blood pressures well controlled on combination of amlodipine 10 mg by mouth daily, quinapril 40 mg daily, and Toprol-XL 100 mg by mouth daily.  He is also here for followup of  his hyperlipidemia. He has history of coronary artery disease and peripheral vascular disease. He is currently on Lipitor 10 mg by mouth daily goal LDL less than 70. He denies myalgia or right upper quadrant pain.  Concerns of pitting edema in his legs which is trace to +1. He denies orthopnea or PND. He is also having nocturnal leg cramps in his calves. He denies symptoms of claudication. Past Medical History  Diagnosis Date  . PVD (peripheral vascular disease)     rt renal artery stent  . CAD (coronary artery disease)     30% LAD Stenosis, 70% ramus intermedius stenosis, treated with PTCA and angioplasty by Dr Albertine Patricia 2004  . DM (diabetes mellitus)   . Dyslipidemia   . Renal insufficiency   . Thrombocytopenia   . Hypertension   . Vitamin D deficiency   . Non Hodgkin's lymphoma   . Inguinal hernia     right  . Adenomatous colon polyp   . Cataract     right eye  . Hyperlipidemia   . Prostate cancer     seed implants no recurrence  . 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 Prostate ca 09/10/2011    Gleason 3+3 R, 3+4 L lobe May 2007 Rx Radioactive seed implants Dr [name]  . DVT (deep venous thrombosis)     secondary to surgery  . UTI (urinary tract infection)    Current Outpatient Prescriptions on File Prior to Visit  Medication Sig Dispense Refill  . acetaminophen (  TYLENOL) 500 MG tablet Take 500 mg by mouth every 6 (six) hours as needed for pain.      Marland Kitchen amLODipine (NORVASC) 10 MG tablet TAKE 1 TABLET EVERY DAY  30 tablet  3  . atorvastatin (LIPITOR) 10 MG tablet Take 0.5 tablets (5 mg total) by mouth daily. Takes 1/2 of 10mg  tablet  30 tablet  2  . clopidogrel (PLAVIX) 75 MG tablet Take 1 tablet (75 mg total) by mouth daily.  30 tablet  6  . diphenhydrAMINE (BENADRYL) 25 mg capsule Take 25 mg by mouth at bedtime as needed for itching.      . Insulin Glargine  (LANTUS SOLOSTAR) 100 UNIT/ML SOPN Inject 25 Units into the skin at bedtime.  5 pen  PRN  . metoprolol succinate (TOPROL-XL) 100 MG 24 hr tablet Take 1 tablet (100 mg total) by mouth daily. Take with or immediately following a meal.  90 tablet  3  . oxybutynin (DITROPAN-XL) 5 MG 24 hr tablet Take 10 mg by mouth at bedtime.       . pioglitazone (ACTOS) 30 MG tablet Take 1 tablet (30 mg total) by mouth daily.  30 tablet  5  . quinapril (ACCUPRIL) 40 MG tablet Take 40 mg by mouth daily.       . Tamsulosin HCl (FLOMAX) 0.4 MG CAPS Take 0.4 mg by mouth daily.        . [DISCONTINUED] cloNIDine (CATAPRES) 0.1 MG tablet Take 0.1 mg by mouth 2 (two) times daily.         Current Facility-Administered Medications on File Prior to Visit  Medication Dose Route Frequency Provider Last Rate Last Dose  . cyanocobalamin ((VITAMIN B-12)) injection 1,000 mcg  1,000 mcg Intramuscular Once [name], MD       History   Social History  . Marital Status: Married    Spouse Name: N/A    Number of Children: N/A  . Years of Education: N/A   Occupational History  . Not on file.   Social History Main Topics  . Smoking status: Former Research scientist (life sciences)  . Smokeless tobacco: Never Used     Comment: Quit 30 years ago.  . Alcohol Use: No  . Drug Use: No  . Sexually Active: Yes -- Male partner(s)   Other Topics Concern  . Not on file   Social History Narrative  . No narrative on file     Review of Systems  All other systems reviewed and are negative.      review of systems is otherwise negative Objective:   Physical Exam  Constitutional: He appears well-developed and well-nourished.  Eyes: Conjunctivae and EOM are normal. Pupils are equal, round, and reactive to light.  Neck: Normal range of motion. No JVD present. No thyromegaly present.  Cardiovascular: Normal rate, normal heart sounds and intact distal pulses.  Exam reveals no gallop.   No murmur heard. Pulses:      Dorsalis pedis pulses are 1+ on  the right side, and 1+ on the left side.       Posterior tibial pulses are 1+ on the right side, and 1+ on the left side.  Pulmonary/Chest: Effort normal and breath sounds normal. No respiratory distress. He has no wheezes. He has no rales. He exhibits no tenderness.  Abdominal: Soft. Bowel sounds are normal. He exhibits no distension and no mass. There is no tenderness. There is no rebound and no guarding.  Musculoskeletal: He exhibits edema.  Lymphadenopathy:    He has  no cervical adenopathy.   Sensation is 4 over 4-10 mg monofilament bilaterally       Assessment & Plan:  1. Type II or unspecified type diabetes mellitus without mention of complication, not stated as uncontrolled Patient's blood sugars seem well controlled. I will check his hemoglobin A1c. His goal A1c is less than 7. - COMPLETE METABOLIC PANEL WITH GFR - Hemoglobin A1c  2. HLD (hyperlipidemia) Check fasting lipid panel. His goal LDL is less than 70 given his history of coronary artery disease and peripheral vascular - Lipid panel  3. HTN (hypertension) Blood pressure is well controlled. Continue current medications at the present dosages. The patient's pitting edema does not bother him. There is no signs of congestive heart failure. Therefore we will not change amlodipine or the Actos. He also is not interested in a diuretic due to his cramps.  4. Nocturnal muscle cramps These do not sound like claudication or neuropathy or myalgias due to statin. He sounds like he is having nocturnal muscle cramps. I will check a CMP to rule out hypokalemia. If negative the patient is interested in trying to chloroquine.

## 2013-01-09 NOTE — Addendum Note (Signed)
Addended by: Shary Decamp B on: 01/09/2013 10:26 AM   Modules accepted: Orders

## 2013-01-10 LAB — IMMUNOFIXATION ELECTROPHORESIS: IgG (Immunoglobin G), Serum: 911 mg/dL (ref 650–1600)

## 2013-01-11 ENCOUNTER — Other Ambulatory Visit: Payer: Self-pay | Admitting: Family Medicine

## 2013-01-11 MED ORDER — CHLOROQUINE PHOSPHATE 250 MG PO TABS: ORAL_TABLET | ORAL | Status: DC

## 2013-01-24 ENCOUNTER — Telehealth: Payer: Self-pay | Admitting: Family Medicine

## 2013-01-24 NOTE — Telephone Encounter (Signed)
Needs to have dental extractions.  Want to know about Plavix.  Can it be discontinued?  Yes/no    They are giving him antibiotics and pain meds in mean time.

## 2013-01-25 ENCOUNTER — Telehealth: Payer: Self-pay | Admitting: Cardiology

## 2013-01-25 NOTE — Telephone Encounter (Signed)
Dentist aware

## 2013-01-25 NOTE — Telephone Encounter (Signed)
I am okay with him stopping plavix 1 week prior to procedure, but I recommend he call Dr. [name] (his cardiologist) and make sure he is okay with it as well.

## 2013-01-25 NOTE — Telephone Encounter (Signed)
Pt per - having dental extractions and needs to start holding his Plavix this Wednesday.  Pt aware Dr [name] not available to give approval to hold.  Aware once Dr [name] reviews I will call and let him know.  Pt will call his dentist

## 2013-01-25 NOTE — Telephone Encounter (Signed)
Spoke with daughter who understands Dr [name] will review and I will call back with recommendations

## 2013-01-25 NOTE — Telephone Encounter (Signed)
Follow Up     Pts daughter calling regarding an OK for pt to come off PLAVIX. Pts daughter has some additional questions. Please call.

## 2013-01-25 NOTE — Telephone Encounter (Signed)
New Prob  Pt is having dental work done and wants to know if it is ok to come off his plavix for 7 days.

## 2013-01-28 NOTE — Telephone Encounter (Signed)
OK to hold Plavix.  Doesn't look like we have seen him in awhile.  We need to get him in for a routine appt.

## 2013-01-29 NOTE — Telephone Encounter (Signed)
New Problem   Daughter called states that the dentist wants the patient to come off of Plavic for dental work// [place] Advise

## 2013-01-29 NOTE — Telephone Encounter (Signed)
Returned call to patient's daughter she stated father needs some teeth pulled and Dentist wants him to stop plavix.Message sent to [place] for advice.

## 2013-01-30 ENCOUNTER — Telehealth: Payer: Self-pay | Admitting: Family Medicine

## 2013-01-30 NOTE — Telephone Encounter (Signed)
Daughter and dentist are aware. The daughter does not want phenergan called in because her dad is refusing to take the pain medication because it makes him sick. I told her that if they change their mind to give [place] a call back. The dentist is going to call the daughter and schedule an appointment to have the tooth pulled.

## 2013-01-30 NOTE — Telephone Encounter (Signed)
Spoke with Anderson Malta at dental office and advised, they will contact patient. Did speak with daughter and she will have patient call for follow up appointment

## 2013-01-30 NOTE — Telephone Encounter (Signed)
I would stop plavix and have tooth pulled.  Takes at least 5 days for plavix to clear.  I feel the risk for heart problems after only 5 days is minimal.  Resume plavix after procedure.

## 2013-01-30 NOTE — Telephone Encounter (Signed)
Follow up    Office aware that Dr. [name] was on vacation . Office calling back regarding patient stopping plavix . Office states patient in a lot of pain.  Please advise.

## 2013-01-30 NOTE — Telephone Encounter (Signed)
Daughter called this morning to ask a question. Her dad is trying to get a tooth pulled and we told the dentist it was ok for him to stop plavix a week before, but you also wanted him to consult with his cardiologiest. His cardiologist has been out of the office for a week and her dad is in a lot of pain. She does not know what to do because he is in so much pain and the pain medication that he was given makes him sick. I told her to call the dentist and see what they suggest, but she also wants to know what you suggest he does.

## 2013-01-30 NOTE — Telephone Encounter (Signed)
He could also take phenergan 12.5mg  q 6 hrs prn for nausea.  Do they want me to prescribe that?

## 2013-02-12 ENCOUNTER — Ambulatory Visit (INDEPENDENT_AMBULATORY_CARE_PROVIDER_SITE_OTHER): Payer: Medicare Other | Admitting: Family Medicine

## 2013-02-12 DIAGNOSIS — E538 Deficiency of other specified B group vitamins: Secondary | ICD-10-CM | POA: Diagnosis not present

## 2013-02-12 MED ORDER — CYANOCOBALAMIN 1000 MCG/ML IJ SOLN
1000.0000 ug | Freq: Once | INTRAMUSCULAR | Status: AC
  Administered 2013-02-12: 1000 ug via INTRAMUSCULAR

## 2013-03-13 ENCOUNTER — Other Ambulatory Visit: Payer: Self-pay | Admitting: Family Medicine

## 2013-03-14 ENCOUNTER — Ambulatory Visit (INDEPENDENT_AMBULATORY_CARE_PROVIDER_SITE_OTHER): Payer: Medicare Other | Admitting: Family Medicine

## 2013-03-14 DIAGNOSIS — Z23 Encounter for immunization: Secondary | ICD-10-CM | POA: Diagnosis not present

## 2013-03-14 DIAGNOSIS — E538 Deficiency of other specified B group vitamins: Secondary | ICD-10-CM | POA: Diagnosis not present

## 2013-03-14 MED ORDER — CYANOCOBALAMIN 1000 MCG/ML IJ SOLN
1000.0000 ug | Freq: Once | INTRAMUSCULAR | Status: AC
  Administered 2013-03-14: 1000 ug via INTRAMUSCULAR

## 2013-03-20 ENCOUNTER — Encounter: Payer: Self-pay | Admitting: Oncology

## 2013-03-20 ENCOUNTER — Other Ambulatory Visit (HOSPITAL_BASED_OUTPATIENT_CLINIC_OR_DEPARTMENT_OTHER): Payer: Medicare Other | Admitting: Lab

## 2013-03-20 ENCOUNTER — Telehealth: Payer: Self-pay | Admitting: Oncology

## 2013-03-20 ENCOUNTER — Ambulatory Visit (HOSPITAL_BASED_OUTPATIENT_CLINIC_OR_DEPARTMENT_OTHER): Payer: Medicare Other | Admitting: Oncology

## 2013-03-20 VITALS — BP 132/73 | HR 64 | Temp 97.0°F | Resp 20 | Ht 69.0 in | Wt 219.5 lb

## 2013-03-20 DIAGNOSIS — Z86718 Personal history of other venous thrombosis and embolism: Secondary | ICD-10-CM | POA: Diagnosis not present

## 2013-03-20 DIAGNOSIS — D72821 Monocytosis (symptomatic): Secondary | ICD-10-CM | POA: Insufficient documentation

## 2013-03-20 DIAGNOSIS — C61 Malignant neoplasm of prostate: Secondary | ICD-10-CM

## 2013-03-20 DIAGNOSIS — C8590 Non-Hodgkin lymphoma, unspecified, unspecified site: Secondary | ICD-10-CM

## 2013-03-20 DIAGNOSIS — D649 Anemia, unspecified: Secondary | ICD-10-CM

## 2013-03-20 DIAGNOSIS — E538 Deficiency of other specified B group vitamins: Secondary | ICD-10-CM

## 2013-03-20 DIAGNOSIS — Z8546 Personal history of malignant neoplasm of prostate: Secondary | ICD-10-CM | POA: Diagnosis not present

## 2013-03-20 DIAGNOSIS — C8589 Other specified types of non-Hodgkin lymphoma, extranodal and solid organ sites: Secondary | ICD-10-CM

## 2013-03-20 DIAGNOSIS — D696 Thrombocytopenia, unspecified: Secondary | ICD-10-CM | POA: Diagnosis not present

## 2013-03-20 DIAGNOSIS — D539 Nutritional anemia, unspecified: Secondary | ICD-10-CM

## 2013-03-20 DIAGNOSIS — N189 Chronic kidney disease, unspecified: Secondary | ICD-10-CM

## 2013-03-20 HISTORY — DX: Nutritional anemia, unspecified: D53.9

## 2013-03-20 HISTORY — DX: Monocytosis (symptomatic): D72.821

## 2013-03-20 LAB — CBC WITH DIFFERENTIAL/PLATELET
Basophils Absolute: 0.1 10*3/uL (ref 0.0–0.1)
EOS%: 2.9 % (ref 0.0–7.0)
Eosinophils Absolute: 0.2 10*3/uL (ref 0.0–0.5)
HCT: 38.5 % (ref 38.4–49.9)
HGB: 12.9 g/dL — ABNORMAL LOW (ref 13.0–17.1)
LYMPH%: 37.2 % (ref 14.0–49.0)
MCHC: 33.4 g/dL (ref 32.0–36.0)
MONO#: 1 10*3/uL — ABNORMAL HIGH (ref 0.1–0.9)
NEUT#: 3.2 10*3/uL (ref 1.5–6.5)
Platelets: 104 10*3/uL — ABNORMAL LOW (ref 140–400)
RDW: 16.7 % — ABNORMAL HIGH (ref 11.0–14.6)
WBC: 7.2 10*3/uL (ref 4.0–10.3)
lymph#: 2.7 10*3/uL (ref 0.9–3.3)

## 2013-03-20 LAB — COMPREHENSIVE METABOLIC PANEL (CC13)
ALT: 9 U/L (ref 0–55)
Albumin: 3.4 g/dL — ABNORMAL LOW (ref 3.5–5.0)
Alkaline Phosphatase: 73 U/L (ref 40–150)
CO2: 23 mEq/L (ref 22–29)
Calcium: 8.8 mg/dL (ref 8.4–10.4)
Chloride: 108 mEq/L (ref 98–109)
Glucose: 150 mg/dl — ABNORMAL HIGH (ref 70–140)
Potassium: 4.1 mEq/L (ref 3.5–5.1)
Sodium: 140 mEq/L (ref 136–145)
Total Bilirubin: 0.35 mg/dL (ref 0.20–1.20)
Total Protein: 7 g/dL (ref 6.4–8.3)

## 2013-03-20 NOTE — Telephone Encounter (Signed)
gv and printed appt sched and avs forpt for March 2015  °

## 2013-03-21 NOTE — Progress Notes (Signed)
Hematology and Oncology Follow Up Visit  Ricardo Watkins [id number] [birth year] 79 y.o. 03/21/2013 6:38 PM   Principle Diagnosis: Encounter Diagnoses  Name Primary?  . Non Hodgkin's lymphoma Yes  . THROMBOCYTOPENIA   . Prostate ca   . Macrocytic anemia   . Monocytosis      Interim History:    Followup visit for this  79 year old man initially diagnosed with follicular grade 2, B-cell, non-Hodgkin's lymphoma in August 2000 when he presented with bulky retroperitoneal and periaortic lymphadenopathy. He achieved a near complete response to 4 cycles of fludarabine, mitoxantrone, and dexamethasone. Due to significant myelosuppression, chemotherapy was stopped after 4 cycles. He was put on consolidation therapy with Rituxan weekly x4. All treatments were completed by 07/21/1999. He has had no signs of recurrent lymphoma since that time.  Over time he has developed other problems. He developed cancer of the prostate Gleason 3+3 right lobe, 3+ 4 left lobe, in May of 2007 treated with radioactive iodine seed implants.  He developed a modest fall in his platelet count 4 years ago consistent with chronic ITP. Platelets have stayed at or around 100,000.  He had a stroke in July 2012. There was an acute infarction in the genu and posterior limb of the right anterior capsule. 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   At time of his visit here in April of this year, I noted there was  a 2 g fall in his hemoglobin compared with last year. He has an elevated MCV of 105, a monocytosis up to 16%, borderline elevation of LDH up to 303, low bilirubin 0.35, normal serum immunoglobulin levels and no monoclonal proteins on immunofixation electrophoresis reproducible x2 in April and again in July of this year. Normal folic acid level. Borderline decreased B12 level 194, back in April. He was started on B12 injections without any major improvement in his degree of anemia. Iron studies  normal with high normal ferritin 317. Findings above are consistent with a myelodysplastic syndrome likely related to his remote chemotherapy treatments. Hemoglobin level has stabilized at 13 g. Platelets and white count stable. I do not plan to put him through bone marrow biopsy. A CT scan done subsequent to his April 2014 visit did not show any evidence for recurrent lymphoma.  I noted a new medication on his list-chloroquine. I questioned him about this. It was started for leg cramps and has been successful.    Medications: reviewed  Allergies:  Allergies  Allergen Reactions  . Glucophage [Metformin Hydrochloride]     Chest pain  . Zetia [Ezetimibe]     weakness  . Fenofibrate Rash  . Niacin-Lovastatin Er Rash    Review of Systems: Constitutional:   He is enough a horse and gaining weight. No fever. HEENT no sore throat Respiratory: No cough or dyspnea Cardiovascular:  No chest pain or palpitations Gastrointestinal: No abdominal pain or change in bowel habit Genito-Urinary: Nocturia x4 Musculoskeletal: Chronic arthritis pain Neurologic: No headache or change in vision. No new focal neurologic deficits Skin: No rash or ecchymosis  Remaining ROS negative.     Physical Exam: Blood pressure 132/73, pulse 64, temperature 97 F (36.1 C), temperature source Oral, resp. rate 20, height 5\' 9"  (1.753 m), weight 219 lb 8 oz (99.565 kg). Wt Readings from Last 3 Encounters:  03/20/13 219 lb 8 oz (99.565 kg)  01/09/13 216 lb (97.977 kg)  11/08/12 211 lb 1.6 oz (95.754 kg)     General appearance: Well-nourished  Caucasian man HENNT: Pharynx no erythema or exudate, no thyromegaly Lymph nodes: No cervical, supraclavicular, axillary, or inguinal adenopathy Breasts: Lungs: Clear to auscultation resonant to percussion Heart: Regular rhythm, 1/6 aortic systolic murmur, Abdomen: Soft, nontender, no mass, no organomegaly Extremities: 1-2+ ankle edema Musculoskeletal: No joint  deformities GU: Vascular: No carotid bruits, no cyanosis Neurologic: He is hard of hearing, cranial nerves otherwise grossly normal, motor strength 5 over 5, reflexes 1+ symmetric Skin: No rash or ecchymosis  Lab Results: CBC W/Diff    Component Value Date/Time   WBC 7.2 03/20/2013 1415   WBC 8.3 01/19/2011 0605   RBC 3.67* 03/20/2013 1415   RBC 4.20* 01/19/2011 0605   HGB 12.9* 03/20/2013 1415   HGB 14.2 01/19/2011 0605   HCT 38.5 03/20/2013 1415   HCT 42.5 01/19/2011 0605   PLT 104* 03/20/2013 1415   PLT 104* 01/19/2011 0605   MCV 105.0* 03/20/2013 1415   MCV 101.2* 01/19/2011 0605   MCH 35.1* 03/20/2013 1415   MCH 33.8 01/19/2011 0605   MCHC 33.4 03/20/2013 1415   MCHC 33.4 01/19/2011 0605   RDW 16.7* 03/20/2013 1415   RDW 15.5 01/19/2011 0605   LYMPHSABS 2.7 03/20/2013 1415   LYMPHSABS 2.1 01/18/2011 1539   MONOABS 1.0* 03/20/2013 1415   MONOABS 1.3* 01/18/2011 1539   EOSABS 0.2 03/20/2013 1415   EOSABS 0.1 01/18/2011 1539   BASOSABS 0.1 03/20/2013 1415   BASOSABS 0.0 01/18/2011 1539     Chemistry      Component Value Date/Time   NA 140 03/20/2013 1415   NA 141 01/09/2013 1003   K 4.1 03/20/2013 1415   K 4.3 01/09/2013 1003   CL 106 01/09/2013 1003   CL 107 09/27/2012 0940   CO2 23 03/20/2013 1415   CO2 26 01/09/2013 1003   BUN 22.2 03/20/2013 1415   BUN 22 01/09/2013 1003   CREATININE 1.8* 03/20/2013 1415   CREATININE 1.67* 01/09/2013 1003   CREATININE 1.64* 09/10/2011 1204      Component Value Date/Time   CALCIUM 8.8 03/20/2013 1415   CALCIUM 9.2 01/09/2013 1003   ALKPHOS 73 03/20/2013 1415   ALKPHOS 59 01/09/2013 1003   AST 11 03/20/2013 1415   AST 16 01/09/2013 1003   ALT 9 03/20/2013 1415   ALT 15 01/09/2013 1003   BILITOT 0.35 03/20/2013 1415   BILITOT 0.4 01/09/2013 1003        Impression: 1. Low-grade, B-cell, non-Hodgkin's lymphoma diagnosed July 2000, treated with a combination of chemotherapy and Rituxan. He remains clinically free of disease at this time now out over 14 years  from diagnosis.  2. Gleason 3+ 3 right lobe, 3 + 4 left lobe prostate cancer diagnosed May 2007; treated with radioactive seed implants. Current PSA undetectable at 0.01 09/22/12  3. 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 Platelet count is stable at around 100,000.  4. Chronic renal insufficiency.  5. Essential hypertension.  6. Type 2 diabetes.  7. History of a left lower extremity DVT while being treated for lymphoma December 2000.  8. Right-sided thrombotic stroke with residual left-sided neurologic deficits in 01/18/2011  9. History of renal artery stenosis.  #10. Valvular heart disease with minimal changes in the aortic and mitral valves by echocardiogram  #11. Macrocytic anemia with associated monocytosis and mild thrombocytopenia, elevated ferritin-suspect myelodysplastic syndrome related to his previous chemotherapy treatments. Blood counts overall stable. Plan observation alone     CC:. Dr. Fransisco Hertz, MD 10/1/20146:38 PM

## 2013-04-13 ENCOUNTER — Ambulatory Visit (INDEPENDENT_AMBULATORY_CARE_PROVIDER_SITE_OTHER): Payer: Medicare Other | Admitting: Family Medicine

## 2013-04-13 DIAGNOSIS — E538 Deficiency of other specified B group vitamins: Secondary | ICD-10-CM

## 2013-04-13 MED ORDER — CYANOCOBALAMIN 1000 MCG/ML IJ SOLN
1000.0000 ug | Freq: Once | INTRAMUSCULAR | Status: AC
  Administered 2013-04-13: 1000 ug via INTRAMUSCULAR

## 2013-04-14 ENCOUNTER — Other Ambulatory Visit: Payer: Self-pay | Admitting: Physician Assistant

## 2013-04-30 ENCOUNTER — Other Ambulatory Visit: Payer: Self-pay | Admitting: Physician Assistant

## 2013-05-14 ENCOUNTER — Ambulatory Visit (INDEPENDENT_AMBULATORY_CARE_PROVIDER_SITE_OTHER): Payer: Medicare Other | Admitting: Family Medicine

## 2013-05-14 DIAGNOSIS — E538 Deficiency of other specified B group vitamins: Secondary | ICD-10-CM

## 2013-05-14 MED ORDER — CYANOCOBALAMIN 1000 MCG/ML IJ SOLN
1000.0000 ug | Freq: Once | INTRAMUSCULAR | Status: AC
Start: 2013-05-14 — End: 2013-05-14
  Administered 2013-05-14: 1000 ug via INTRAMUSCULAR

## 2013-05-21 ENCOUNTER — Encounter: Payer: Self-pay | Admitting: Family Medicine

## 2013-05-21 ENCOUNTER — Ambulatory Visit (INDEPENDENT_AMBULATORY_CARE_PROVIDER_SITE_OTHER): Payer: Medicare Other | Admitting: Family Medicine

## 2013-05-21 VITALS — BP 110/60 | HR 68 | Temp 98.3°F | Resp 20 | Ht 65.0 in | Wt 215.0 lb

## 2013-05-21 DIAGNOSIS — B9689 Other specified bacterial agents as the cause of diseases classified elsewhere: Secondary | ICD-10-CM | POA: Diagnosis not present

## 2013-05-21 DIAGNOSIS — J988 Other specified respiratory disorders: Secondary | ICD-10-CM | POA: Diagnosis not present

## 2013-05-21 DIAGNOSIS — A499 Bacterial infection, unspecified: Secondary | ICD-10-CM | POA: Diagnosis not present

## 2013-05-21 MED ORDER — AMOXICILLIN-POT CLAVULANATE 875-125 MG PO TABS: 1.0000 | ORAL_TABLET | Freq: Two times a day (BID) | ORAL | Status: DC

## 2013-05-21 NOTE — Patient Instructions (Signed)
Take antibiotics for respiratory infection Use Plain Mucinex over the counter 1 tablet twice a day Continue Coricidan Call if not better by Wed and Chest xray will be done

## 2013-05-21 NOTE — Progress Notes (Signed)
   Subjective:    Patient ID: Ricardo Watkins, male    DOB: 01/29/1931, 79 y.o.   MRN: [id number]  HPI 79 year old male with history of coronary artery disease, CVA, diabetes mellitus, prostate cancer presents with cough with congestion and body aches  for the past week. He's been taking over-the-counter Coricidin with some improvement. He has not had any fever but has had some chills. He has difficulty coughing up the sputum. He denies any shortness of breath or chest pain. He has had some runny nose and sneezing.   Review of Systems - per above  GEN- denies fatigue, fever, weight loss,weakness, recent illness HEENT- denies eye drainage, change in vision, +nasal discharge, CVS- denies chest pain, palpitations RESP- denies SOB, +cough, wheeze ABD- denies N/V, change in stools, abd pain Neuro- denies headache, dizziness, syncope, seizure activity       Objective:   Physical Exam GEN- NAD, alert and oriented x3, non toxic appearing HEENT- PERRL, EOMI, non injected sclera, pink conjunctiva, MMM, oropharynx clear, TM clear bilat no effusion, no maxillary sinus tenderness, nares clear  Neck- Supple,shotty LAD CVS- RRR, 2/6 SEM  RESP- clear with rhonchi right base, no wheeze, normal WOB EXT- No edema Pulses- Radial 2+          Assessment & Plan:

## 2013-05-21 NOTE — Assessment & Plan Note (Signed)
Although he looks fairly well on examination his pulmonary exam suggest that he may be growing a bacterial infection down the right lower lobe. Based on his history and comorbidities I will go ahead and cover him with antibiotics. He can also continue the Coricidin or they can change over to Robitussin. If he does not improve I will get a chest x-ray

## 2013-06-18 ENCOUNTER — Ambulatory Visit (INDEPENDENT_AMBULATORY_CARE_PROVIDER_SITE_OTHER): Payer: Medicare Other | Admitting: Family Medicine

## 2013-06-18 DIAGNOSIS — E538 Deficiency of other specified B group vitamins: Secondary | ICD-10-CM | POA: Diagnosis not present

## 2013-06-18 MED ORDER — CYANOCOBALAMIN 1000 MCG/ML IJ SOLN
1000.0000 ug | Freq: Once | INTRAMUSCULAR | Status: AC
  Administered 2013-06-18: 1000 ug via INTRAMUSCULAR

## 2013-06-25 ENCOUNTER — Ambulatory Visit (INDEPENDENT_AMBULATORY_CARE_PROVIDER_SITE_OTHER): Payer: Medicare Other | Admitting: Cardiology

## 2013-06-25 ENCOUNTER — Encounter: Payer: Self-pay | Admitting: Cardiology

## 2013-06-25 VITALS — BP 118/66 | HR 70 | Ht 69.0 in | Wt 217.0 lb

## 2013-06-25 DIAGNOSIS — I2510 Atherosclerotic heart disease of native coronary artery without angina pectoris: Secondary | ICD-10-CM

## 2013-06-25 DIAGNOSIS — R0989 Other specified symptoms and signs involving the circulatory and respiratory systems: Secondary | ICD-10-CM | POA: Diagnosis not present

## 2013-06-25 NOTE — Progress Notes (Signed)
HPI  The patient presents for followup of his known coronary disease. Since I last saw him he has done well from a cardiovascular standpoint. He's had no new symptoms since a stress perfusion study in 2012. He does some minimal yard work.  With this he denies any chest pressure, neck or arm discomfort. He doesn't have any palpitations, presyncope or syncope. He has no PND or orthopnea. He has some chronic dyspnea with exertion. He does have some chronic lower extremity swelling which is probably related to medications but all of this is unchanged from previous.   Allergies  Allergen Reactions  . Glucophage [Metformin Hydrochloride]     Chest pain  . Zetia [Ezetimibe]     weakness  . Fenofibrate Rash  . Niacin-Lovastatin Er Rash    Current Outpatient Prescriptions  Medication Sig Dispense Refill  . acetaminophen (TYLENOL) 500 MG tablet Take 500 mg by mouth every 6 (six) hours as needed for pain.      Marland Kitchen amLODipine (NORVASC) 10 MG tablet TAKE 1 TABLET BY MOUTH EVERY DAY  30 tablet  3  . atorvastatin (LIPITOR) 10 MG tablet Take 0.5 tablets (5 mg total) by mouth daily. Takes 1/2 of 10mg  tablet  30 tablet  2  . chloroquine (ARALEN) 250 MG tablet Take daily for 2 weeks then decrease to 1 pill a week for leg cramps.  30 tablet  1  . clopidogrel (PLAVIX) 75 MG tablet TAKE 1 TABLET (75 MG TOTAL) BY MOUTH DAILY.  30 tablet  6  . cyanocobalamin (,VITAMIN B-12,) 1000 MCG/ML injection Inject 1,000 mcg into the muscle every 30 (thirty) days.      . Insulin Glargine 100 UNIT/ML SOPN Inject 25 Units into the skin daily.      . metoprolol succinate (TOPROL-XL) 100 MG 24 hr tablet Take 1 tablet (100 mg total) by mouth daily. Take with or immediately following a meal.  90 tablet  3  . oxybutynin (DITROPAN-XL) 5 MG 24 hr tablet Take 10 mg by mouth at bedtime.       . pioglitazone (ACTOS) 30 MG tablet TAKE 1 TABLET EVERY DAY  30 tablet  5  . quinapril (ACCUPRIL) 40 MG tablet TAKE 1 TABLET BY MOUTH EVERY DAY   90 tablet  2  . Tamsulosin HCl (FLOMAX) 0.4 MG CAPS Take 0.4 mg by mouth daily.        . [DISCONTINUED] cloNIDine (CATAPRES) 0.1 MG tablet Take 0.1 mg by mouth 2 (two) times daily.         No current facility-administered medications for this visit.    Past Medical History  Diagnosis Date  . PVD (peripheral vascular disease)     rt renal artery stent  . CAD (coronary artery disease)     30% LAD Stenosis, 70% ramus intermedius stenosis, treated with PTCA and angioplasty by Dr Albertine Patricia 2004  . DM (diabetes mellitus)   . Dyslipidemia   . Renal insufficiency   . Thrombocytopenia   . Hypertension   . Vitamin D deficiency   . Non Hodgkin's lymphoma   . Inguinal hernia     right  . Adenomatous colon polyp   . Cataract     right eye  . Hyperlipidemia   . Prostate cancer     seed implants no recurrence  . 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 Prostate ca 09/10/2011  Gleason 3+3 R, 3+4 L lobe May 2007 Rx Radioactive seed implants Dr [name]  . DVT (deep venous thrombosis)     secondary to surgery  . UTI (urinary tract infection)   . 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 Cystourethroscopy      ROBOTIC ARM NUCLETRON SEED IMPLANTATION OF PROSTATE  . Exploratory laparotomy      For evaluation of lymphoma  . Colonoscopy    . Appendectomy      ROS:  As stated in the HPI and negative for all other systems.  PHYSICAL EXAM BP 118/66  Pulse 70  Ht 5\' 9"  (1.753 m)  Wt 217 lb (98.431 kg)  BMI 32.03 kg/m2 GENERAL:  Well appearing HEENT:  Pupils equal round and reactive, fundi not visualized, oral mucosa unremarkable NECK:  No jugular venous distention, waveform within normal limits, carotid upstroke brisk and symmetric, positive bilateral  soft bruits versus transmitted systolic murmurs, no thyromegaly LYMPHATICS:  No cervical, inguinal adenopathy LUNGS:  Clear to auscultation bilaterally BACK:  No CVA tenderness CHEST:  Unremarkable HEART:  PMI not displaced or sustained,S1 and S2 within normal limits, no S3, no S4, no clicks, no rubs, 2/6 apical systolic murmur radiating out the aortic outflow tract short and early peaking, no diastolic murmurs ABD:  Flat, positive bowel sounds normal in frequency in pitch, no bruits, no rebound, no guarding, no midline pulsatile mass, no hepatomegaly, no splenomegaly EXT:  2 plus pulses throughout,  mild bilateral lower extremityedema, no cyanosis no clubbing SKIN:  No rashes no nodules NEURO:  Cranial nerves II through XII grossly intact, motor grossly intact throughout PSYCH:  Cognitively intact, oriented to person place and time  EKG:  Sinus rhythm, rate 70, axis within normal limits, intervals within normal limits, poor anterior R wave progression, nonspecific T-wave flattening, premature ventricular contractions. 06/25/2013  ASSESSMENT AND PLAN  CAD:  The patient has no new sypmtoms.  No further cardiovascular testing is indicated.  We will continue with aggressive risk reduction and meds as listed.  BRUIT:  He will have carotid Doppler.  HYPERLIPIDMEIA:  His LDL in July was 87.  He will continue with the meds as listed.  CKD:  Creat has been stable.  EDEMA:  We discussed conservative therapies.

## 2013-06-25 NOTE — Patient Instructions (Signed)
Your physician has requested that you have a carotid duplex. This test is an ultrasound of the carotid arteries in your neck. It looks at blood flow through these arteries that supply the brain with blood. Allow one hour for this exam. There are no restrictions or special instructions.   Your physician wants you to follow-up in: 1 year  You will receive a reminder letter in the mail two months in advance. If you don't receive a letter, please call our office to schedule the follow-up appointment.   Your physician recommends that you continue on your current medications as directed. Please refer to the Current Medication list given to you today.

## 2013-06-28 ENCOUNTER — Encounter: Payer: Self-pay | Admitting: Cardiology

## 2013-06-28 ENCOUNTER — Ambulatory Visit (HOSPITAL_COMMUNITY): Payer: Medicare Other | Attending: Cardiology

## 2013-06-28 DIAGNOSIS — I10 Essential (primary) hypertension: Secondary | ICD-10-CM | POA: Insufficient documentation

## 2013-06-28 DIAGNOSIS — I2510 Atherosclerotic heart disease of native coronary artery without angina pectoris: Secondary | ICD-10-CM | POA: Diagnosis not present

## 2013-06-28 DIAGNOSIS — I739 Peripheral vascular disease, unspecified: Secondary | ICD-10-CM | POA: Diagnosis not present

## 2013-06-28 DIAGNOSIS — Z8673 Personal history of transient ischemic attack (TIA), and cerebral infarction without residual deficits: Secondary | ICD-10-CM | POA: Insufficient documentation

## 2013-06-28 DIAGNOSIS — R0989 Other specified symptoms and signs involving the circulatory and respiratory systems: Secondary | ICD-10-CM | POA: Diagnosis not present

## 2013-06-28 DIAGNOSIS — E785 Hyperlipidemia, unspecified: Secondary | ICD-10-CM | POA: Insufficient documentation

## 2013-06-28 DIAGNOSIS — I6529 Occlusion and stenosis of unspecified carotid artery: Secondary | ICD-10-CM | POA: Diagnosis not present

## 2013-06-28 DIAGNOSIS — I658 Occlusion and stenosis of other precerebral arteries: Secondary | ICD-10-CM | POA: Diagnosis not present

## 2013-06-28 DIAGNOSIS — Z87891 Personal history of nicotine dependence: Secondary | ICD-10-CM | POA: Insufficient documentation

## 2013-07-16 ENCOUNTER — Ambulatory Visit (INDEPENDENT_AMBULATORY_CARE_PROVIDER_SITE_OTHER): Payer: Medicare Other | Admitting: Family Medicine

## 2013-07-16 ENCOUNTER — Encounter: Payer: Self-pay | Admitting: Family Medicine

## 2013-07-16 VITALS — BP 110/60 | HR 64 | Temp 97.1°F | Resp 16 | Ht 69.0 in | Wt 220.0 lb

## 2013-07-16 DIAGNOSIS — E785 Hyperlipidemia, unspecified: Secondary | ICD-10-CM

## 2013-07-16 DIAGNOSIS — I10 Essential (primary) hypertension: Secondary | ICD-10-CM

## 2013-07-16 DIAGNOSIS — Z23 Encounter for immunization: Secondary | ICD-10-CM | POA: Diagnosis not present

## 2013-07-16 DIAGNOSIS — E119 Type 2 diabetes mellitus without complications: Secondary | ICD-10-CM | POA: Diagnosis not present

## 2013-07-16 LAB — LIPID PANEL
CHOLESTEROL: 132 mg/dL (ref 0–200)
HDL: 37 mg/dL — ABNORMAL LOW (ref >39)
LDL CALC: 72 mg/dL (ref 0–99)
TRIGLYCERIDES: 117 mg/dL (ref <150)
Total CHOL/HDL Ratio: 3.6 Ratio
VLDL: 23 mg/dL (ref 0–40)

## 2013-07-16 LAB — COMPLETE METABOLIC PANEL WITH GFR
ALT: 13 U/L (ref 0–53)
AST: 19 U/L (ref 0–37)
Albumin: 3.8 g/dL (ref 3.5–5.2)
Alkaline Phosphatase: 69 U/L (ref 39–117)
BUN: 21 mg/dL (ref 6–23)
CO2: 29 mEq/L (ref 19–32)
CREATININE: 1.63 mg/dL — AB (ref 0.50–1.35)
Calcium: 9.1 mg/dL (ref 8.4–10.5)
Chloride: 107 mEq/L (ref 96–112)
GFR, Est African American: 45 mL/min — ABNORMAL LOW
GFR, Est Non African American: 39 mL/min — ABNORMAL LOW
Glucose, Bld: 88 mg/dL (ref 70–99)
Potassium: 4.7 mEq/L (ref 3.5–5.3)
Sodium: 142 mEq/L (ref 135–145)
Total Bilirubin: 0.4 mg/dL (ref 0.3–1.2)
Total Protein: 6.7 g/dL (ref 6.0–8.3)

## 2013-07-16 LAB — HEMOGLOBIN A1C
Hgb A1c MFr Bld: 6 % — ABNORMAL HIGH (ref <5.7)
Mean Plasma Glucose: 126 mg/dL — ABNORMAL HIGH (ref <117)

## 2013-07-16 NOTE — Progress Notes (Signed)
Subjective:    Patient ID: Ricardo Watkins, male    DOB: July 13, 1930, 80 y.o.   MRN: [id number]  HPI Patient was seen by cardiology earlier this month. Carotid Dopplers at that time revealed a 60-79% blockage in his left carotid. He will recheck this in 6 months. He is currently taking Plavix as an antiplatelet agent. He is here today for followup of his blood sugars. His fasting blood sugars are ranging 80-110. 2 hour postprandial sugars are ranging 100-130. The patient is having no hypoglycemia. His sugars are well controlled and very consistent. Unfortunately he is experiencing +1 pretibial edema but is likely triggered by the combination of Actos and amlodipine. The patient is dissatisfied with his blood sugar control however that he is not interested in changing his medications at the present time. He denies any chest pain. He does report mild dyspnea on exertion that improves with rest. He denies any orthopnea. Past Medical History  Diagnosis Date  . PVD (peripheral vascular disease)     rt renal artery stent  . CAD (coronary artery disease)     30% LAD Stenosis, 70% ramus intermedius stenosis, treated with PTCA and angioplasty by Dr Albertine Patricia 2004  . DM (diabetes mellitus)   . Dyslipidemia   . Renal insufficiency   . Thrombocytopenia   . Hypertension   . Vitamin D deficiency   . Non Hodgkin's lymphoma   . Inguinal hernia     right  . Adenomatous colon polyp   . Cataract     right eye  . Hyperlipidemia   . Prostate cancer     seed implants no recurrence  . 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 Prostate ca 09/10/2011    Gleason 3+3 R, 3+4 L lobe May 2007 Rx Radioactive seed implants Dr [name]  . DVT (deep venous thrombosis)     secondary to surgery  . UTI (urinary tract infection)   . 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 acetaminophen (TYLENOL) 500 MG tablet Take 500 mg by mouth every 6 (six) hours as needed for pain.      Marland Kitchen amLODipine (NORVASC) 10 MG tablet TAKE 1 TABLET BY MOUTH EVERY DAY  30 tablet  3  . atorvastatin (LIPITOR) 10 MG tablet Take 0.5 tablets (5 mg total) by mouth daily. Takes 1/2 of 10mg  tablet  30 tablet  2  . chloroquine (ARALEN) 250 MG tablet Take daily for 2 weeks then decrease to 1 pill a week for leg cramps.  30 tablet  1  . clopidogrel (PLAVIX) 75 MG tablet TAKE 1 TABLET (75 MG TOTAL) BY MOUTH DAILY.  30 tablet  6  . cyanocobalamin (,VITAMIN B-12,) 1000 MCG/ML injection Inject 1,000 mcg into the muscle every 30 (thirty) days.      . Insulin Glargine 100 UNIT/ML SOPN Inject 25 Units into the skin daily.      . metoprolol succinate (TOPROL-XL) 100 MG 24 hr tablet Take 1 tablet (100 mg total) by mouth daily. Take with or immediately following a meal.  90 tablet  3  . oxybutynin (DITROPAN-XL) 5 MG 24 hr tablet Take 10 mg by mouth at bedtime.       Marland Kitchen  pioglitazone (ACTOS) 30 MG tablet TAKE 1 TABLET EVERY DAY  30 tablet  5  . quinapril (ACCUPRIL) 40 MG tablet TAKE 1 TABLET BY MOUTH EVERY DAY  90 tablet  2  . Tamsulosin HCl (FLOMAX) 0.4 MG CAPS Take 0.4 mg by mouth daily.        . [DISCONTINUED] cloNIDine (CATAPRES) 0.1 MG tablet Take 0.1 mg by mouth 2 (two) times daily.         No current facility-administered medications on file prior to visit.   Allergies  Allergen Reactions  . Glucophage [Metformin Hydrochloride]     Chest pain  . Zetia [Ezetimibe]     weakness  . Fenofibrate Rash  . Niacin-Lovastatin Er Rash   History   Social History  . Marital Status: Married    Spouse Name: N/A    Number of Children: N/A  . Years of Education: N/A   Occupational History  . Not on file.   Social History Main Topics  . Smoking status: Former Research scientist (life sciences)  .  Smokeless tobacco: Never Used     Comment: Quit 30 years ago.  . Alcohol Use: No  . Drug Use: No  . Sexual Activity: Yes    Partners: Female   Other Topics Concern  . Not on file   Social History Narrative  . No narrative on file      Review of Systems  All other systems reviewed and are negative.       Objective:   Physical Exam  Vitals reviewed. Constitutional: He appears well-developed and well-nourished. No distress.  Eyes: Conjunctivae are normal. Right eye exhibits no discharge. Left eye exhibits no discharge. No scleral icterus.  Neck: Neck supple. No JVD present. No thyromegaly present.  Cardiovascular: Normal rate, regular rhythm and intact distal pulses.  Exam reveals no gallop and no friction rub.   Murmur heard. Pulmonary/Chest: Effort normal and breath sounds normal. No respiratory distress. He has no wheezes. He has no rales. He exhibits no tenderness.  Abdominal: Soft. Bowel sounds are normal. He exhibits no distension and no mass. There is no tenderness. There is no rebound and no guarding.  Musculoskeletal: He exhibits edema.  Lymphadenopathy:    He has no cervical adenopathy.  Skin: He is not diaphoretic.          Assessment & Plan:  1. Type II or unspecified type diabetes mellitus without mention of complication, not stated as uncontrolled Check hemoglobin A1c. Patient's goal hemoglobin A1c less than 6.5. His sugar values are outstanding. On magnetic change medication at this time. - COMPLETE METABOLIC PANEL WITH GFR - Lipid panel - Hemoglobin A1c  2. Need for prophylactic vaccination against Streptococcus pneumoniae (pneumococcus) - Pneumococcal conjugate vaccine 13-valent IM  3. HTN (hypertension) blood pressure is well controlled. Continue current medications at their present dosages.  4. HLD (hyperlipidemia) Check fasting lipid panel. Go LDL is less than 70 given his known carotid stenosis and coronary artery disease.

## 2013-07-19 ENCOUNTER — Ambulatory Visit (INDEPENDENT_AMBULATORY_CARE_PROVIDER_SITE_OTHER): Payer: Medicare Other | Admitting: *Deleted

## 2013-07-19 ENCOUNTER — Other Ambulatory Visit: Payer: Self-pay | Admitting: Family Medicine

## 2013-07-19 DIAGNOSIS — E538 Deficiency of other specified B group vitamins: Secondary | ICD-10-CM

## 2013-07-19 MED ORDER — CYANOCOBALAMIN 1000 MCG/ML IJ SOLN
1000.0000 ug | Freq: Once | INTRAMUSCULAR | Status: AC
  Administered 2013-07-19: 1000 ug via INTRAMUSCULAR

## 2013-07-27 ENCOUNTER — Other Ambulatory Visit: Payer: Self-pay | Admitting: Family Medicine

## 2013-08-18 ENCOUNTER — Telehealth: Payer: Self-pay | Admitting: *Deleted

## 2013-08-18 ENCOUNTER — Encounter: Payer: Self-pay | Admitting: *Deleted

## 2013-08-18 NOTE — Telephone Encounter (Signed)
Former pt of Dr. Darnell Level, Letter and [place] printed. F/u with [name] scheduled  on 04/19/14

## 2013-08-20 ENCOUNTER — Ambulatory Visit (INDEPENDENT_AMBULATORY_CARE_PROVIDER_SITE_OTHER): Payer: Medicare Other | Admitting: *Deleted

## 2013-08-20 DIAGNOSIS — E538 Deficiency of other specified B group vitamins: Secondary | ICD-10-CM

## 2013-08-20 MED ORDER — CYANOCOBALAMIN 1000 MCG/ML IJ SOLN
1000.0000 ug | Freq: Once | INTRAMUSCULAR | Status: AC
  Administered 2013-08-20: 1000 ug via INTRAMUSCULAR

## 2013-09-18 ENCOUNTER — Other Ambulatory Visit (HOSPITAL_BASED_OUTPATIENT_CLINIC_OR_DEPARTMENT_OTHER): Payer: Medicare Other

## 2013-09-18 ENCOUNTER — Ambulatory Visit (HOSPITAL_BASED_OUTPATIENT_CLINIC_OR_DEPARTMENT_OTHER): Payer: Medicare Other | Admitting: Nurse Practitioner

## 2013-09-18 ENCOUNTER — Telehealth: Payer: Self-pay | Admitting: Hematology and Oncology

## 2013-09-18 VITALS — BP 144/75 | HR 60 | Temp 97.7°F | Resp 17 | Ht 68.0 in | Wt 219.8 lb

## 2013-09-18 DIAGNOSIS — I2510 Atherosclerotic heart disease of native coronary artery without angina pectoris: Secondary | ICD-10-CM | POA: Diagnosis not present

## 2013-09-18 DIAGNOSIS — C61 Malignant neoplasm of prostate: Secondary | ICD-10-CM | POA: Diagnosis not present

## 2013-09-18 DIAGNOSIS — C8589 Other specified types of non-Hodgkin lymphoma, extranodal and solid organ sites: Secondary | ICD-10-CM | POA: Diagnosis not present

## 2013-09-18 DIAGNOSIS — D649 Anemia, unspecified: Secondary | ICD-10-CM | POA: Diagnosis not present

## 2013-09-18 DIAGNOSIS — D696 Thrombocytopenia, unspecified: Secondary | ICD-10-CM | POA: Diagnosis not present

## 2013-09-18 DIAGNOSIS — N189 Chronic kidney disease, unspecified: Secondary | ICD-10-CM | POA: Diagnosis not present

## 2013-09-18 DIAGNOSIS — C8590 Non-Hodgkin lymphoma, unspecified, unspecified site: Secondary | ICD-10-CM

## 2013-09-18 DIAGNOSIS — D72821 Monocytosis (symptomatic): Secondary | ICD-10-CM

## 2013-09-18 DIAGNOSIS — D539 Nutritional anemia, unspecified: Secondary | ICD-10-CM

## 2013-09-18 LAB — COMPREHENSIVE METABOLIC PANEL (CC13)
ALK PHOS: 71 U/L (ref 40–150)
ALT: 14 U/L (ref 0–55)
ANION GAP: 10 meq/L (ref 3–11)
AST: 17 U/L (ref 5–34)
Albumin: 3.6 g/dL (ref 3.5–5.0)
BILIRUBIN TOTAL: 0.31 mg/dL (ref 0.20–1.20)
BUN: 19.4 mg/dL (ref 7.0–26.0)
CO2: 25 mEq/L (ref 22–29)
CREATININE: 1.6 mg/dL — AB (ref 0.7–1.3)
Calcium: 9.1 mg/dL (ref 8.4–10.4)
Chloride: 108 mEq/L (ref 98–109)
Glucose: 114 mg/dl (ref 70–140)
Potassium: 4.2 mEq/L (ref 3.5–5.1)
Sodium: 143 mEq/L (ref 136–145)
Total Protein: 6.9 g/dL (ref 6.4–8.3)

## 2013-09-18 LAB — CBC & DIFF AND RETIC
BASO%: 0.2 % (ref 0.0–2.0)
Basophils Absolute: 0 10*3/uL (ref 0.0–0.1)
EOS%: 1.8 % (ref 0.0–7.0)
Eosinophils Absolute: 0.1 10*3/uL (ref 0.0–0.5)
HCT: 40 % (ref 38.4–49.9)
HGB: 13 g/dL (ref 13.0–17.1)
Immature Retic Fract: 16.4 % — ABNORMAL HIGH (ref 3.00–10.60)
LYMPH%: 43.2 % (ref 14.0–49.0)
MCH: 33.3 pg (ref 27.2–33.4)
MCHC: 32.5 g/dL (ref 32.0–36.0)
MCV: 102.6 fL — ABNORMAL HIGH (ref 79.3–98.0)
MONO#: 0.9 10*3/uL (ref 0.1–0.9)
MONO%: 16 % — ABNORMAL HIGH (ref 0.0–14.0)
NEUT#: 2.1 10*3/uL (ref 1.5–6.5)
NEUT%: 38.8 % — ABNORMAL LOW (ref 39.0–75.0)
Platelets: 81 10*3/uL — ABNORMAL LOW (ref 140–400)
RBC: 3.9 10*6/uL — ABNORMAL LOW (ref 4.20–5.82)
RDW: 17 % — AB (ref 11.0–14.6)
RETIC %: 1.86 % — AB (ref 0.80–1.80)
RETIC CT ABS: 72.54 10*3/uL (ref 34.80–93.90)
WBC: 5.5 10*3/uL (ref 4.0–10.3)
lymph#: 2.4 10*3/uL (ref 0.9–3.3)

## 2013-09-18 LAB — LACTATE DEHYDROGENASE (CC13): LDH: 283 U/L — ABNORMAL HIGH (ref 125–245)

## 2013-09-18 LAB — MORPHOLOGY: PLT EST: DECREASED

## 2013-09-18 LAB — PSA: PSA: 0.02 ng/mL (ref <=4.00)

## 2013-09-18 LAB — CHCC SMEAR

## 2013-09-18 NOTE — Telephone Encounter (Signed)
[facility] PT APPT SCHEDULE FOR JUNE. FORMER [facility] TO [facility].

## 2013-09-18 NOTE — Progress Notes (Signed)
[facility] OFFICE PROGRESS NOTE   Diagnosis:  Low-grade B-cell non-Hodgkin's lymphoma; possible MDS.  INTERVAL HISTORY:   Mr. Berst returns as scheduled. To review he is an 80 year old man diagnosed with grade 2 B-cell non-Hodgkin's lymphoma in 2000 when he presented with bulky retroperitoneal and periaortic lymphadenopathy. He achieved a near complete response to 4 cycles of fludarabine, mitoxantrone and dexamethasone. Due to significant myelosuppression the chemotherapy was discontinued after 4 cycles and he was put on consolidation therapy with Rituxan weekly x4. All treatments were completed by 07/21/1999. He has had no signs of recurrent lymphoma. He was diagnosed with prostate cancer in May 2007 treated with radioactive iodine seed implants.   In April 2014 he was noted to have a macrocytic anemia felt to be consistent with a myelodysplastic syndrome likely related to his remote chemotherapy treatment.  In addition he has mild thrombocytopenia with platelet count generally around 100,000 felt to be due to chronic ITP versus MDS.  He is seen today for scheduled followup. Overall he feels well. He has a good appetite. He is gaining weight. No fevers or sweats. He denies pain. He denies bleeding. He has mild stable dyspnea on exertion. No GI or GU complaints. He has had a few falls recently. The falls tends to occur on "uneven ground". He has discussed this with his primary care doctor.   Objective:  Vital signs in last 24 hours:  Blood pressure 144/75, pulse 60, temperature 97.7 F (36.5 C), temperature source Oral, resp. rate 17, height 5\' 8"  (1.727 m), weight 219 lb 12.8 oz (99.701 kg), SpO2 98.00%.    HEENT: Oropharynx is without thrush or ulceration. Lymphatics: No palpable cervical, supraclavicular, axillary or inguinal lymph nodes. Resp: Lungs are clear. Cardio: Regular cardiac rhythm. GI: Abdomen soft and nontender. No organomegaly. Vascular: No leg  edema. Skin: A few small ecchymoses over the abdominal wall at previous insulin injection sites.    Lab Results:  Lab Results  Component Value Date   WBC 5.5 09/18/2013   HGB 13.0 09/18/2013   HCT 40.0 09/18/2013   MCV 102.6* 09/18/2013   PLT 81* 09/18/2013   NEUTROABS 2.1 09/18/2013    Lab Results  Component Value Date   NA 143 09/18/2013    No results found for this basename: CEA,  CA199,  CA125    Imaging:  No results found.  Medications: I have reviewed the patient's current medications.  Assessment/Plan: 1. Low-grade B-cell non-Hodgkin's lymphoma diagnosed in 2000 treated with 4 cycles of fludarabine, mitoxantrone and dexamethasone followed by consolidation therapy with Rituxan. CT scans 09/27/2012 negative for evidence of lymphoma recurrence. 2. Macrocytic anemia with associated monocytosis and mild thrombocytopenia, elevated ferritin. MDS suspected. 3. B12 deficiency 10/11/2012. B12 injections initiated. 4. Gleason 3+3 right lobe, 3+4 left lobe prostate cancer diagnosed May 2007 treated with radioactive seed implants.  5. 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 6. Chronic renal insufficiency. 7. Essential hypertension. 8. Type 2 diabetes. 9. History of left lower extremity DVT while being treated for lymphoma December 2000. 10. Right-sided thrombotic stroke with residual left sided neurologic deficits 01/18/2011. 11. History of renal artery stenosis. 12. Valvular heart disease.   Disposition: He remains in clinical remission from non-Hodgkin's lymphoma. With regard to probable MDS the hemoglobin and white count remain stable. Platelet count generally runs around 100,000 and today is 81,000. I reviewed the labs with [name]. She recommends repeat labs and a followup visit in 3 months. He will contact the office in the interim with  any problems. We specifically discussed bleeding.   Ned Card ANP/GNP-BC   09/18/2013  3:33 PM

## 2013-09-20 ENCOUNTER — Ambulatory Visit (INDEPENDENT_AMBULATORY_CARE_PROVIDER_SITE_OTHER): Payer: Medicare Other | Admitting: *Deleted

## 2013-09-20 DIAGNOSIS — E538 Deficiency of other specified B group vitamins: Secondary | ICD-10-CM

## 2013-09-20 MED ORDER — CYANOCOBALAMIN 1000 MCG/ML IJ SOLN
1000.0000 ug | Freq: Once | INTRAMUSCULAR | Status: AC
Start: 2013-09-20 — End: 2013-09-20
  Administered 2013-09-20: 1000 ug via INTRAMUSCULAR

## 2013-10-10 DIAGNOSIS — Z8546 Personal history of malignant neoplasm of prostate: Secondary | ICD-10-CM | POA: Diagnosis not present

## 2013-10-14 ENCOUNTER — Other Ambulatory Visit: Payer: Self-pay | Admitting: Family Medicine

## 2013-10-18 DIAGNOSIS — N320 Bladder-neck obstruction: Secondary | ICD-10-CM | POA: Diagnosis not present

## 2013-10-18 DIAGNOSIS — Z8546 Personal history of malignant neoplasm of prostate: Secondary | ICD-10-CM | POA: Diagnosis not present

## 2013-10-18 DIAGNOSIS — R351 Nocturia: Secondary | ICD-10-CM | POA: Diagnosis not present

## 2013-10-22 ENCOUNTER — Ambulatory Visit (INDEPENDENT_AMBULATORY_CARE_PROVIDER_SITE_OTHER): Payer: Medicare Other | Admitting: Family Medicine

## 2013-10-22 DIAGNOSIS — E538 Deficiency of other specified B group vitamins: Secondary | ICD-10-CM

## 2013-10-22 MED ORDER — CYANOCOBALAMIN 1000 MCG/ML IJ SOLN
1000.0000 ug | Freq: Once | INTRAMUSCULAR | Status: AC
  Administered 2013-10-22: 1000 ug via INTRAMUSCULAR

## 2013-10-29 DIAGNOSIS — E119 Type 2 diabetes mellitus without complications: Secondary | ICD-10-CM | POA: Diagnosis not present

## 2013-10-29 DIAGNOSIS — Z961 Presence of intraocular lens: Secondary | ICD-10-CM | POA: Diagnosis not present

## 2013-10-29 DIAGNOSIS — Z794 Long term (current) use of insulin: Secondary | ICD-10-CM | POA: Diagnosis not present

## 2013-11-22 ENCOUNTER — Ambulatory Visit (INDEPENDENT_AMBULATORY_CARE_PROVIDER_SITE_OTHER): Payer: Medicare Other | Admitting: *Deleted

## 2013-11-22 DIAGNOSIS — E538 Deficiency of other specified B group vitamins: Secondary | ICD-10-CM

## 2013-11-22 MED ORDER — CYANOCOBALAMIN 1000 MCG/ML IJ SOLN
1000.0000 ug | Freq: Once | INTRAMUSCULAR | Status: AC
  Administered 2013-11-22: 1000 ug via INTRAMUSCULAR

## 2013-11-25 ENCOUNTER — Other Ambulatory Visit: Payer: Self-pay | Admitting: Physician Assistant

## 2013-11-26 NOTE — Telephone Encounter (Signed)
Medication refilled per protocol. 

## 2013-11-28 ENCOUNTER — Telehealth: Payer: Self-pay | Admitting: Family Medicine

## 2013-11-28 ENCOUNTER — Other Ambulatory Visit: Payer: Self-pay | Admitting: Family Medicine

## 2013-11-28 MED ORDER — AMLODIPINE BESYLATE 10 MG PO TABS: ORAL_TABLET | ORAL | Status: DC

## 2013-11-28 MED ORDER — PIOGLITAZONE HCL 30 MG PO TABS: ORAL_TABLET | ORAL | Status: DC

## 2013-11-28 MED ORDER — CLOPIDOGREL BISULFATE 75 MG PO TABS: 75.0000 mg | ORAL_TABLET | Freq: Every day | ORAL | Status: DC

## 2013-11-28 NOTE — Telephone Encounter (Signed)
Medication refilled per protocol. 

## 2013-11-28 NOTE — Telephone Encounter (Signed)
Rx Refilled  

## 2013-12-10 ENCOUNTER — Other Ambulatory Visit (HOSPITAL_BASED_OUTPATIENT_CLINIC_OR_DEPARTMENT_OTHER): Payer: Medicare Other

## 2013-12-10 ENCOUNTER — Telehealth: Payer: Self-pay | Admitting: Hematology and Oncology

## 2013-12-10 ENCOUNTER — Ambulatory Visit (HOSPITAL_BASED_OUTPATIENT_CLINIC_OR_DEPARTMENT_OTHER): Payer: Medicare Other | Admitting: Hematology and Oncology

## 2013-12-10 ENCOUNTER — Encounter: Payer: Self-pay | Admitting: Hematology and Oncology

## 2013-12-10 VITALS — BP 122/54 | HR 54 | Temp 97.3°F | Resp 18 | Ht 68.0 in | Wt 218.3 lb

## 2013-12-10 DIAGNOSIS — Z87898 Personal history of other specified conditions: Secondary | ICD-10-CM | POA: Diagnosis not present

## 2013-12-10 DIAGNOSIS — D509 Iron deficiency anemia, unspecified: Secondary | ICD-10-CM | POA: Diagnosis not present

## 2013-12-10 DIAGNOSIS — Z8546 Personal history of malignant neoplasm of prostate: Secondary | ICD-10-CM

## 2013-12-10 DIAGNOSIS — D696 Thrombocytopenia, unspecified: Secondary | ICD-10-CM | POA: Diagnosis not present

## 2013-12-10 DIAGNOSIS — C61 Malignant neoplasm of prostate: Secondary | ICD-10-CM

## 2013-12-10 DIAGNOSIS — D539 Nutritional anemia, unspecified: Secondary | ICD-10-CM

## 2013-12-10 DIAGNOSIS — C8589 Other specified types of non-Hodgkin lymphoma, extranodal and solid organ sites: Secondary | ICD-10-CM

## 2013-12-10 DIAGNOSIS — C8590 Non-Hodgkin lymphoma, unspecified, unspecified site: Secondary | ICD-10-CM

## 2013-12-10 LAB — COMPREHENSIVE METABOLIC PANEL (CC13)
ALK PHOS: 71 U/L (ref 40–150)
ALT: 14 U/L (ref 0–55)
AST: 15 U/L (ref 5–34)
Albumin: 3.4 g/dL — ABNORMAL LOW (ref 3.5–5.0)
Anion Gap: 8 mEq/L (ref 3–11)
BUN: 28.2 mg/dL — AB (ref 7.0–26.0)
CALCIUM: 9 mg/dL (ref 8.4–10.4)
CO2: 24 mEq/L (ref 22–29)
CREATININE: 1.7 mg/dL — AB (ref 0.7–1.3)
Chloride: 110 mEq/L — ABNORMAL HIGH (ref 98–109)
GLUCOSE: 127 mg/dL (ref 70–140)
Potassium: 4.2 mEq/L (ref 3.5–5.1)
Sodium: 142 mEq/L (ref 136–145)
Total Bilirubin: 0.37 mg/dL (ref 0.20–1.20)
Total Protein: 6.4 g/dL (ref 6.4–8.3)

## 2013-12-10 LAB — CBC WITH DIFFERENTIAL/PLATELET
BASO%: 0.2 % (ref 0.0–2.0)
BASOS ABS: 0 10*3/uL (ref 0.0–0.1)
EOS%: 1.6 % (ref 0.0–7.0)
Eosinophils Absolute: 0.1 10*3/uL (ref 0.0–0.5)
HEMATOCRIT: 38.1 % — AB (ref 38.4–49.9)
HEMOGLOBIN: 12.6 g/dL — AB (ref 13.0–17.1)
LYMPH#: 2.6 10*3/uL (ref 0.9–3.3)
LYMPH%: 42.7 % (ref 14.0–49.0)
MCH: 32.8 pg (ref 27.2–33.4)
MCHC: 33.1 g/dL (ref 32.0–36.0)
MCV: 99.2 fL — AB (ref 79.3–98.0)
MONO#: 0.9 10*3/uL (ref 0.1–0.9)
MONO%: 14.3 % — ABNORMAL HIGH (ref 0.0–14.0)
NEUT#: 2.5 10*3/uL (ref 1.5–6.5)
NEUT%: 41.2 % (ref 39.0–75.0)
PLATELETS: 84 10*3/uL — AB (ref 140–400)
RBC: 3.84 10*6/uL — ABNORMAL LOW (ref 4.20–5.82)
RDW: 18.3 % — ABNORMAL HIGH (ref 11.0–14.6)
WBC: 6.1 10*3/uL (ref 4.0–10.3)
nRBC: 0 % (ref 0–0)

## 2013-12-10 LAB — CHCC SMEAR

## 2013-12-10 NOTE — Assessment & Plan Note (Signed)
This is likely related to some underlying bone marrow disorder. Right now, he is not symptomatic from some mild bruising. I recommend close observation with history, physical examination and blood work in 4 months.

## 2013-12-10 NOTE — Assessment & Plan Note (Signed)
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 I would not recommend bone marrow aspirate and biopsy right now as the patient is not symptomatic. However, if he starts to become more anemic with symptoms or with progressive pancytopenia, we will perform bone marrow aspirate and biopsy.

## 2013-12-10 NOTE — Progress Notes (Signed)
[name] progress notes  Patient Care Team: [name], MD as PCP - General (Family Medicine) Heath Lark, MD as Consulting Physician (Hematology and Oncology)  CHIEF COMPLAINTS/PURPOSE OF VISIT:  Macrocytic anemia, thrombocytopenia, history of non-Hodgkin lymphoma and prostate cancer  HISTORY OF PRESENTING ILLNESS:  Ricardo Watkins 80 y.o. male was transferred to my care after his prior physician has left.  I reviewed the patient's records extensive and collaborated the history with the patient. Summary of his history is as follows: He was initially diagnosed with follicular grade 2, B-cell, non-Hodgkin's lymphoma in August 2000 when he presented with bulky retroperitoneal and periaortic lymphadenopathy. He achieved a near complete response to 4 cycles of fludarabine, mitoxantrone, and dexamethasone. Due to significant myelosuppression, chemotherapy was stopped after 4 cycles. He was put on consolidation therapy with Rituxan weekly x4. All treatments were completed by 07/21/1999. He has had no signs of recurrent lymphoma since that time.  Over time he has developed other problems. He developed cancer of the prostate Gleason 3+3 right lobe, 3+ 4 left lobe, in May of 2007 treated with radioactive iodine seed implants.  He developed a modest fall in his platelet count 4 years ago consistent with chronic ITP. Platelets have stayed at or around 100,000.  He had a stroke in July 2012. There was an acute infarction in the genu and posterior limb of the right anterior capsule. 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  He has an elevated MCV of 105, a monocytosis up to 16%, borderline elevation of LDH up to 303, low bilirubin 0.35, normal serum immunoglobulin levels and no monoclonal proteins on immunofixation electrophoresis. He had borderline decreased B12 level 194, back in April. He was started on B12 injections without any major improvement  in his degree of anemia. Iron studies normal with high normal ferritin 317. Findings above are consistent with a myelodysplastic syndrome likely related to his remote chemotherapy treatments. Hemoglobin level has stabilized at 13 g. Platelets and white count stable. A CT scan done subsequent to his April 2014 visit did not show any evidence for recurrent lymphoma.  He feels well. The patient denies any recent signs or symptoms of bleeding such as spontaneous epistaxis, hematuria or hematochezia. Denies any new lymphadenopathy. MEDICAL HISTORY:  Past Medical History  Diagnosis Date  . PVD (peripheral vascular disease)     rt renal artery stent  . CAD (coronary artery disease)     30% LAD Stenosis, 70% ramus intermedius stenosis, treated with PTCA and angioplasty by Dr Albertine Patricia 2004  . DM (diabetes mellitus)   . Dyslipidemia   . Renal insufficiency   . Thrombocytopenia   . Hypertension   . Vitamin D deficiency   . Non Hodgkin's lymphoma   . Inguinal hernia     right  . Adenomatous colon polyp   . Cataract     right eye  . Hyperlipidemia   . Prostate cancer     seed implants no recurrence  . 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 Prostate ca 09/10/2011    Gleason 3+3 R, 3+4 L lobe May 2007 Rx Radioactive seed implants Dr [name]  . DVT (deep venous thrombosis)     secondary to surgery  . UTI (urinary tract infection)   . 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 Cancer     lymphoma    SURGICAL HISTORY: Past Surgical History  Procedure Laterality Date  . Cystourethroscopy      ROBOTIC ARM NUCLETRON SEED IMPLANTATION OF PROSTATE  . Exploratory laparotomy      For evaluation of lymphoma  . Colonoscopy    . Appendectomy      SOCIAL HISTORY: History   Social History  . Marital Status:  Married    Spouse Name: N/A    Number of Children: N/A  . Years of Education: N/A   Occupational History  . Not on file.   Social History Main Topics  . Smoking status: Former Research scientist (life sciences)  . Smokeless tobacco: Never Used     Comment: Quit 30 years ago.  . Alcohol Use: No  . Drug Use: No  . Sexual Activity: Yes    Partners: Female   Other Topics Concern  . Not on file   Social History Narrative  . No narrative on file    FAMILY HISTORY: Family History  Problem Relation Age of Onset  . Cancer Sister     lymphoma  . Cancer Brother     prostate ca    ALLERGIES:  is allergic to glucophage; zetia; fenofibrate; and niacin-lovastatin er.  MEDICATIONS:  Current Outpatient Prescriptions  Medication Sig Dispense Refill  . acetaminophen (TYLENOL) 500 MG tablet Take 500 mg by mouth every 6 (six) hours as needed for pain.      Marland Kitchen amLODipine (NORVASC) 10 MG tablet TAKE 1 TABLET BY MOUTH EVERY DAY  90 tablet  3  . atorvastatin (LIPITOR) 10 MG tablet TAKE 1/2 TABLET BY MOUTH DAILY  30 tablet  2  . chloroquine (ARALEN) 250 MG tablet TAKE DAILY FOR 2 WEEKS THEN DECREASE TO 1 PILL A WEEK FOR LEG CRAMPS.  30 tablet  0  . clopidogrel (PLAVIX) 75 MG tablet Take 1 tablet (75 mg total) by mouth daily with breakfast.  90 tablet  1  . cyanocobalamin (,VITAMIN B-12,) 1000 MCG/ML injection Inject 1,000 mcg into the muscle every 30 (thirty) days.      . Insulin Glargine 100 UNIT/ML SOPN Inject 25 Units into the skin daily.      . metoprolol succinate (TOPROL-XL) 100 MG 24 hr tablet Take 1 tablet (100 mg total) by mouth daily. Take with or immediately following a meal.  90 tablet  3  . oxybutynin (DITROPAN-XL) 5 MG 24 hr tablet Take 10 mg by mouth at bedtime.       . pioglitazone (ACTOS) 30 MG tablet TAKE 1 TABLET EVERY DAY  90 tablet  3  . quinapril (ACCUPRIL) 40 MG tablet TAKE 1 TABLET BY MOUTH EVERY DAY  90 tablet  2  . Tamsulosin HCl (FLOMAX) 0.4 MG CAPS Take 0.4 mg by mouth daily.        .  [DISCONTINUED] cloNIDine (CATAPRES) 0.1 MG tablet Take 0.1 mg by mouth 2 (two) times daily.         No current facility-administered medications for this visit.    REVIEW OF SYSTEMS:   Constitutional: Denies fevers, chills or abnormal night sweats Eyes: Denies blurriness of vision, double vision or watery eyes Ears, nose, mouth, throat, and face: Denies mucositis or sore throat Respiratory: Denies cough, dyspnea or wheezes Cardiovascular: Denies palpitation, chest discomfort or lower extremity swelling Gastrointestinal:  Denies nausea, heartburn or change in bowel habits Skin: Denies abnormal skin rashes Lymphatics: Denies new lymphadenopathy Neurological:Denies  numbness, tingling or new weaknesses Behavioral/Psych: Mood is stable, no new changes  All other systems were reviewed with the patient and are negative.  PHYSICAL EXAMINATION: ECOG PERFORMANCE STATUS: 1 - Symptomatic but completely ambulatory  Filed Vitals:   12/10/13 1326  BP: 122/54  Pulse: 54  Temp: 97.3 F (36.3 C)  Resp: 18   Filed Weights   12/10/13 1326  Weight: 218 lb 4.8 oz (99.02 kg)    GENERAL:alert, no distress and comfortable SKIN: skin color, texture, turgor are normal, no rashes or significant lesions. Mild bruising is noted EYES: normal, conjunctiva are pink and non-injected, sclera clear OROPHARYNX:no exudate, normal lips, buccal mucosa, and tongue  NECK: supple, thyroid normal size, non-tender, without nodularity LYMPH:  no palpable lymphadenopathy in the cervical, axillary or inguinal LUNGS: clear to auscultation and percussion with normal breathing effort HEART: Soft systolic murmur noted, regular rate & rhythm without lower extremity edema ABDOMEN:abdomen soft, non-tender and normal bowel sounds Musculoskeletal:no cyanosis of digits and no clubbing  PSYCH: alert & oriented x 3 with fluent speech NEURO: no focal motor/sensory deficits  LABORATORY DATA:  I have reviewed the data as  listed Lab Results  Component Value Date   WBC 6.1 12/10/2013   HGB 12.6* 12/10/2013   HCT 38.1* 12/10/2013   MCV 99.2* 12/10/2013   PLT 84* 12/10/2013    Recent Labs  01/09/13 1003  07/16/13 0854 09/18/13 1417 12/10/13 1315  NA 141  < > 142 143 142  K 4.3  < > 4.7 4.2 4.2  CL 106  --  107  --   --   CO2 26  < > 29 25 24   GLUCOSE 83  < > 88 114 127  BUN 22  < > 21 19.4 28.2*  CREATININE 1.67*  < > 1.63* 1.6* 1.7*  CALCIUM 9.2  < > 9.1 9.1 9.0  GFRNONAA 38*  --  39*  --   --   GFRAA 44*  --  45*  --   --   PROT 6.8  < > 6.7 6.9 6.4  ALBUMIN 4.0  < > 3.8 3.6 3.4*  AST 16  < > 19 17 15   ALT 15  < > 13 14 14   ALKPHOS 59  < > 69 71 71  BILITOT 0.4  < > 0.4 0.31 0.37  < > = values in this interval not displayed.  ASSESSMENT & PLAN:  LYMPHOMA Clinically, he has no evidence of disease recurrence. I do not recommend repeating imaging studies as the patient has signs and symptoms to suggest disease recurrence. Continue history, physical examination as well as blood work on a regular basis.  Prostate ca Clinically, he has no evidence of recurrence. I would defer to the urologist for future followup.  Macrocytic anemia The cause is unknown but suspect chemotherapy related myelodysplastic syndrome related to radiation. I would not recommend bone marrow aspirate and biopsy right now as the patient is not symptomatic. However, if he starts to become more anemic with symptoms or with progressive pancytopenia, we will perform bone marrow aspirate and biopsy.  THROMBOCYTOPENIA This is likely related to some underlying bone marrow disorder. Right now, he is not symptomatic from some mild bruising. I recommend close observation with history, physical examination and blood work in 4 months.    Orders Placed This Encounter  Procedures  . CBC & Diff and Retic    Standing Status: Future     Number of Occurrences:  Standing Expiration Date: 12/10/2014  . Lactate dehydrogenase     Standing Status: Future     Number of Occurrences:      Standing Expiration Date: 12/10/2014  . Comprehensive metabolic panel    Standing Status: Future     Number of Occurrences:      Standing Expiration Date: 12/10/2014    All questions were answered. The patient knows to call the clinic with any problems, questions or concerns. I spent 25 minutes counseling the patient face to face. The total time spent in the appointment was 30 minutes and more than 50% was on counseling.     [facility], [place], MD 12/10/2013 8:40 PM

## 2013-12-10 NOTE — Assessment & Plan Note (Signed)
Clinically, he has no evidence of disease recurrence. I do not recommend repeating imaging studies as the patient has signs and symptoms to suggest disease recurrence. Continue history, physical examination as well as blood work on a regular basis.

## 2013-12-10 NOTE — Assessment & Plan Note (Signed)
Clinically, he has no evidence of recurrence. I would defer to the urologist for future followup.

## 2013-12-10 NOTE — Telephone Encounter (Signed)
gv pt avs °

## 2013-12-14 ENCOUNTER — Ambulatory Visit (INDEPENDENT_AMBULATORY_CARE_PROVIDER_SITE_OTHER): Payer: Medicare Other | Admitting: Family Medicine

## 2013-12-14 ENCOUNTER — Encounter: Payer: Self-pay | Admitting: Family Medicine

## 2013-12-14 VITALS — BP 124/62 | HR 60 | Temp 97.0°F | Resp 16 | Ht 69.0 in | Wt 218.0 lb

## 2013-12-14 DIAGNOSIS — IMO0001 Reserved for inherently not codable concepts without codable children: Secondary | ICD-10-CM | POA: Diagnosis not present

## 2013-12-14 DIAGNOSIS — I2510 Atherosclerotic heart disease of native coronary artery without angina pectoris: Secondary | ICD-10-CM | POA: Diagnosis not present

## 2013-12-14 DIAGNOSIS — E785 Hyperlipidemia, unspecified: Secondary | ICD-10-CM | POA: Diagnosis not present

## 2013-12-14 DIAGNOSIS — E1165 Type 2 diabetes mellitus with hyperglycemia: Principal | ICD-10-CM

## 2013-12-14 DIAGNOSIS — I10 Essential (primary) hypertension: Secondary | ICD-10-CM | POA: Diagnosis not present

## 2013-12-14 LAB — COMPLETE METABOLIC PANEL WITH GFR
ALBUMIN: 3.7 g/dL (ref 3.5–5.2)
ALK PHOS: 63 U/L (ref 39–117)
ALT: 12 U/L (ref 0–53)
AST: 14 U/L (ref 0–37)
BUN: 25 mg/dL — ABNORMAL HIGH (ref 6–23)
CALCIUM: 8.6 mg/dL (ref 8.4–10.5)
CHLORIDE: 108 meq/L (ref 96–112)
CO2: 24 mEq/L (ref 19–32)
Creat: 1.75 mg/dL — ABNORMAL HIGH (ref 0.50–1.35)
GFR, Est African American: 41 mL/min — ABNORMAL LOW
GFR, Est Non African American: 36 mL/min — ABNORMAL LOW
Glucose, Bld: 80 mg/dL (ref 70–99)
POTASSIUM: 4.2 meq/L (ref 3.5–5.3)
SODIUM: 143 meq/L (ref 135–145)
TOTAL PROTEIN: 6.7 g/dL (ref 6.0–8.3)
Total Bilirubin: 0.4 mg/dL (ref 0.2–1.2)

## 2013-12-14 LAB — LIPID PANEL
Cholesterol: 129 mg/dL (ref 0–200)
HDL: 34 mg/dL — ABNORMAL LOW (ref >39)
LDL Cholesterol: 69 mg/dL (ref 0–99)
Total CHOL/HDL Ratio: 3.8 Ratio
Triglycerides: 131 mg/dL (ref <150)
VLDL: 26 mg/dL (ref 0–40)

## 2013-12-14 LAB — HEMOGLOBIN A1C
Hgb A1c MFr Bld: 6.4 % — ABNORMAL HIGH (ref <5.7)
Mean Plasma Glucose: 137 mg/dL — ABNORMAL HIGH (ref <117)

## 2013-12-14 NOTE — Progress Notes (Signed)
Subjective:    Patient ID: Ricardo Watkins, male    DOB: Feb 18, 1931, 80 y.o.   MRN: [id number]  HPI  Patient is here today for followup of his diabetes mellitus type 2. He is currently on 25 units of Lantus per day and Actos. His fasting blood sugars are typically 70-80. His postprandial sugars are 100-120. He is having occasional hypoglycemic episodes which make him feel dizzy and weak. His blood pressure is excellent at 124/62. He denies any chest pain shortness of breath or dyspnea on exertion. He denies any myalgias or right upper quadrant pain. He is on Plavix due to his history of coronary artery disease and peripheral basilar disease. He denies any symptoms of claudication. However his exam is significant today for diminished pulses in both feet bilaterally. He also complains of lower back pain particularly when walking. Past Medical History  Diagnosis Date  . PVD (peripheral vascular disease)     rt renal artery stent  . CAD (coronary artery disease)     30% LAD Stenosis, 70% ramus intermedius stenosis, treated with PTCA and angioplasty by Dr Albertine Patricia 2004  . DM (diabetes mellitus)   . Dyslipidemia   . Renal insufficiency   . Thrombocytopenia   . Hypertension   . Vitamin D deficiency   . Non Hodgkin's lymphoma   . Inguinal hernia     right  . Adenomatous colon polyp   . Cataract     right eye  . Hyperlipidemia   . Prostate cancer     seed implants no recurrence  . 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 Prostate ca 09/10/2011    Gleason 3+3 R, 3+4 L lobe May 2007 Rx Radioactive seed implants Dr [name]  . DVT (deep venous thrombosis)     secondary to surgery  . UTI (urinary tract infection)   . 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 Cancer     lymphoma   Past Surgical History    Procedure Laterality Date  . Cystourethroscopy      ROBOTIC ARM NUCLETRON SEED IMPLANTATION OF PROSTATE  . Exploratory laparotomy      For evaluation of lymphoma  . Colonoscopy    . Appendectomy     Current Outpatient Prescriptions on File Prior to Visit  Medication Sig Dispense Refill  . acetaminophen (TYLENOL) 500 MG tablet Take 500 mg by mouth every 6 (six) hours as needed for pain.      Marland Kitchen amLODipine (NORVASC) 10 MG tablet TAKE 1 TABLET BY MOUTH EVERY DAY  90 tablet  3  . atorvastatin (LIPITOR) 10 MG tablet TAKE 1/2 TABLET BY MOUTH DAILY  30 tablet  2  . chloroquine (ARALEN) 250 MG tablet TAKE DAILY FOR 2 WEEKS THEN DECREASE TO 1 PILL A WEEK FOR LEG CRAMPS.  30 tablet  0  . clopidogrel (PLAVIX) 75 MG tablet Take 1 tablet (75 mg total) by mouth daily with breakfast.  90 tablet  1  . cyanocobalamin (,VITAMIN B-12,) 1000 MCG/ML injection Inject 1,000 mcg into the muscle every 30 (thirty) days.      . Insulin Glargine 100 UNIT/ML SOPN Inject 25 Units into the skin daily.      . metoprolol succinate (TOPROL-XL) 100 MG 24 hr tablet Take 1 tablet (100 mg  total) by mouth daily. Take with or immediately following a meal.  90 tablet  3  . oxybutynin (DITROPAN-XL) 5 MG 24 hr tablet Take 10 mg by mouth at bedtime.       . pioglitazone (ACTOS) 30 MG tablet TAKE 1 TABLET EVERY DAY  90 tablet  3  . quinapril (ACCUPRIL) 40 MG tablet TAKE 1 TABLET BY MOUTH EVERY DAY  90 tablet  2  . Tamsulosin HCl (FLOMAX) 0.4 MG CAPS Take 0.4 mg by mouth daily.        . [DISCONTINUED] cloNIDine (CATAPRES) 0.1 MG tablet Take 0.1 mg by mouth 2 (two) times daily.         No current facility-administered medications on file prior to visit.   Allergies  Allergen Reactions  . Glucophage [Metformin Hydrochloride]     Chest pain  . Zetia [Ezetimibe]     weakness  . Fenofibrate Rash  . Niacin-Lovastatin Er Rash   History   Social History  . Marital Status: Married    Spouse Name: N/A    Number of Children: N/A   . Years of Education: N/A   Occupational History  . Not on file.   Social History Main Topics  . Smoking status: Former Research scientist (life sciences)  . Smokeless tobacco: Never Used     Comment: Quit 30 years ago.  . Alcohol Use: No  . Drug Use: No  . Sexual Activity: Yes    Partners: Female   Other Topics Concern  . Not on file   Social History Narrative  . No narrative on file   Family History  Problem Relation Age of Onset  . Cancer Sister     lymphoma  . Cancer Brother     prostate ca     Review of Systems  All other systems reviewed and are negative.      Objective:   Physical Exam  Vitals reviewed. Constitutional: He is oriented to person, place, and time. He appears well-developed and well-nourished.  HENT:  Mouth/Throat: Oropharynx is clear and moist.  Eyes: Conjunctivae are normal. No scleral icterus.  Neck: Neck supple. No JVD present. No thyromegaly present.  Cardiovascular: Normal rate, regular rhythm and normal heart sounds.  Exam reveals no gallop and no friction rub.   No murmur heard. Pulmonary/Chest: Effort normal and breath sounds normal. No respiratory distress. He has no wheezes. He has no rales. He exhibits no tenderness.  Abdominal: Soft. Bowel sounds are normal. He exhibits no distension and no mass. There is no tenderness. There is no rebound and no guarding.  Musculoskeletal: He exhibits edema.  Lymphadenopathy:    He has no cervical adenopathy.  Neurological: He is alert and oriented to person, place, and time. He has normal reflexes. He displays normal reflexes. No cranial nerve deficit. He exhibits normal muscle tone. Coordination normal.  Skin: No rash noted. No erythema.          Assessment & Plan:  1. Type II or unspecified type diabetes mellitus without mention of complication, uncontrolled Blood sugars are excellent. I will check a urine microalbumin. I will also check a fasting lipid panel. Goal LDL is less than 70 given his history of coronary  artery disease and peripheral vascular disease. I will also check a hemoglobin A1c. Due to the hypoglycemic episodes the patient is experiencing, I will decrease his Lantus from 25 units to 18 units per day. Recheck in 3 months. - COMPLETE METABOLIC PANEL WITH GFR - Hemoglobin A1c - Lipid panel -  Microalbumin, urine  2. Essential hypertension Blood pressures outstanding. I will make no changes to his medication at this time.  3. HLD (hyperlipidemia) As stated above I will check a fasting lipid panel. Goal LDL is less than 70 given his history of coronary artery disease and peripheral vascular disease.

## 2013-12-15 LAB — MICROALBUMIN, URINE: Microalb, Ur: 0.86 mg/dL (ref 0.00–1.89)

## 2013-12-18 ENCOUNTER — Other Ambulatory Visit: Payer: Self-pay | Admitting: Family Medicine

## 2013-12-24 ENCOUNTER — Ambulatory Visit (INDEPENDENT_AMBULATORY_CARE_PROVIDER_SITE_OTHER): Payer: Medicare Other | Admitting: Family Medicine

## 2013-12-24 DIAGNOSIS — E538 Deficiency of other specified B group vitamins: Secondary | ICD-10-CM | POA: Diagnosis not present

## 2013-12-24 MED ORDER — CYANOCOBALAMIN 1000 MCG/ML IJ SOLN
1000.0000 ug | Freq: Once | INTRAMUSCULAR | Status: AC
  Administered 2013-12-24: 1000 ug via INTRAMUSCULAR

## 2013-12-27 ENCOUNTER — Other Ambulatory Visit (HOSPITAL_COMMUNITY): Payer: Self-pay | Admitting: Cardiology

## 2013-12-27 DIAGNOSIS — I6529 Occlusion and stenosis of unspecified carotid artery: Secondary | ICD-10-CM

## 2014-01-01 ENCOUNTER — Ambulatory Visit (HOSPITAL_COMMUNITY): Payer: Medicare Other | Attending: Cardiology | Admitting: Radiology

## 2014-01-01 DIAGNOSIS — I6529 Occlusion and stenosis of unspecified carotid artery: Secondary | ICD-10-CM | POA: Insufficient documentation

## 2014-01-01 DIAGNOSIS — I2510 Atherosclerotic heart disease of native coronary artery without angina pectoris: Secondary | ICD-10-CM | POA: Diagnosis not present

## 2014-01-01 DIAGNOSIS — E785 Hyperlipidemia, unspecified: Secondary | ICD-10-CM | POA: Diagnosis not present

## 2014-01-01 DIAGNOSIS — I10 Essential (primary) hypertension: Secondary | ICD-10-CM | POA: Diagnosis not present

## 2014-01-01 DIAGNOSIS — I739 Peripheral vascular disease, unspecified: Secondary | ICD-10-CM | POA: Diagnosis not present

## 2014-01-01 DIAGNOSIS — E119 Type 2 diabetes mellitus without complications: Secondary | ICD-10-CM | POA: Insufficient documentation

## 2014-01-01 NOTE — Progress Notes (Signed)
Carotid Duplex performed. 

## 2014-01-04 ENCOUNTER — Telehealth: Payer: Self-pay | Admitting: Cardiology

## 2014-01-04 NOTE — Telephone Encounter (Signed)
Returning your call. °

## 2014-01-09 ENCOUNTER — Telehealth: Payer: Self-pay | Admitting: Cardiology

## 2014-01-09 NOTE — Telephone Encounter (Signed)
Pt would like his doppler results from last Tuesday.

## 2014-01-09 NOTE — Telephone Encounter (Signed)
Pt informed of his echo results and we will repeat in 6 months

## 2014-01-11 ENCOUNTER — Other Ambulatory Visit: Payer: Self-pay | Admitting: Family Medicine

## 2014-01-11 NOTE — Telephone Encounter (Signed)
Medication refilled per protocol. 

## 2014-01-15 ENCOUNTER — Other Ambulatory Visit: Payer: Self-pay | Admitting: Family Medicine

## 2014-01-15 MED ORDER — QUINAPRIL HCL 40 MG PO TABS: ORAL_TABLET | ORAL | Status: DC

## 2014-01-15 NOTE — Telephone Encounter (Signed)
Ed sent to [facility]

## 2014-01-21 ENCOUNTER — Other Ambulatory Visit: Payer: Self-pay | Admitting: Family Medicine

## 2014-01-22 NOTE — Progress Notes (Signed)
Pt called and informed of his carotid doppler results

## 2014-01-25 ENCOUNTER — Ambulatory Visit (INDEPENDENT_AMBULATORY_CARE_PROVIDER_SITE_OTHER): Payer: Medicare Other | Admitting: *Deleted

## 2014-01-25 DIAGNOSIS — E538 Deficiency of other specified B group vitamins: Secondary | ICD-10-CM

## 2014-01-25 MED ORDER — CYANOCOBALAMIN 1000 MCG/ML IJ SOLN
1000.0000 ug | Freq: Once | INTRAMUSCULAR | Status: AC
  Administered 2014-01-25: 1000 ug via INTRAMUSCULAR

## 2014-01-25 NOTE — Progress Notes (Signed)
Patient ID: Ricardo Watkins, male   DOB: May 07, 1931, 80 y.o.   MRN: [id number] Patient seen in clinic for Vit B12 injection.  Tolerated administration well.

## 2014-02-22 ENCOUNTER — Ambulatory Visit (INDEPENDENT_AMBULATORY_CARE_PROVIDER_SITE_OTHER): Payer: Medicare Other | Admitting: *Deleted

## 2014-02-22 DIAGNOSIS — D518 Other vitamin B12 deficiency anemias: Secondary | ICD-10-CM

## 2014-02-22 DIAGNOSIS — D519 Vitamin B12 deficiency anemia, unspecified: Secondary | ICD-10-CM

## 2014-02-22 MED ORDER — CYANOCOBALAMIN 1000 MCG/ML IJ SOLN
1000.0000 ug | Freq: Once | INTRAMUSCULAR | Status: AC
  Administered 2014-02-22: 1000 ug via INTRAMUSCULAR

## 2014-03-27 ENCOUNTER — Ambulatory Visit (INDEPENDENT_AMBULATORY_CARE_PROVIDER_SITE_OTHER): Payer: Medicare Other | Admitting: Family Medicine

## 2014-03-27 DIAGNOSIS — E538 Deficiency of other specified B group vitamins: Secondary | ICD-10-CM | POA: Diagnosis not present

## 2014-03-27 DIAGNOSIS — Z23 Encounter for immunization: Secondary | ICD-10-CM

## 2014-03-27 MED ORDER — CYANOCOBALAMIN 1000 MCG/ML IJ SOLN: 1000.0000 ug | Freq: Once | INTRAMUSCULAR | Status: DC

## 2014-03-27 MED ORDER — CYANOCOBALAMIN 1000 MCG/ML IJ SOLN
1000.0000 ug | Freq: Once | INTRAMUSCULAR | Status: AC
  Administered 2014-03-27: 1000 ug via INTRAMUSCULAR

## 2014-04-03 ENCOUNTER — Ambulatory Visit: Payer: Medicare Other | Admitting: Hematology and Oncology

## 2014-04-19 ENCOUNTER — Telehealth: Payer: Self-pay | Admitting: Hematology and Oncology

## 2014-04-19 ENCOUNTER — Ambulatory Visit (HOSPITAL_BASED_OUTPATIENT_CLINIC_OR_DEPARTMENT_OTHER): Payer: Medicare Other | Admitting: Hematology and Oncology

## 2014-04-19 ENCOUNTER — Encounter: Payer: Self-pay | Admitting: Hematology and Oncology

## 2014-04-19 ENCOUNTER — Other Ambulatory Visit (HOSPITAL_BASED_OUTPATIENT_CLINIC_OR_DEPARTMENT_OTHER): Payer: Medicare Other

## 2014-04-19 VITALS — BP 118/50 | HR 64 | Temp 98.2°F | Resp 21 | Ht 69.0 in | Wt 220.8 lb

## 2014-04-19 DIAGNOSIS — C8590 Non-Hodgkin lymphoma, unspecified, unspecified site: Secondary | ICD-10-CM

## 2014-04-19 DIAGNOSIS — N183 Chronic kidney disease, stage 3 unspecified: Secondary | ICD-10-CM

## 2014-04-19 DIAGNOSIS — D539 Nutritional anemia, unspecified: Secondary | ICD-10-CM | POA: Insufficient documentation

## 2014-04-19 DIAGNOSIS — E538 Deficiency of other specified B group vitamins: Secondary | ICD-10-CM

## 2014-04-19 DIAGNOSIS — C61 Malignant neoplasm of prostate: Secondary | ICD-10-CM

## 2014-04-19 DIAGNOSIS — D649 Anemia, unspecified: Secondary | ICD-10-CM

## 2014-04-19 DIAGNOSIS — D696 Thrombocytopenia, unspecified: Secondary | ICD-10-CM | POA: Diagnosis not present

## 2014-04-19 DIAGNOSIS — Z8546 Personal history of malignant neoplasm of prostate: Secondary | ICD-10-CM | POA: Diagnosis not present

## 2014-04-19 LAB — COMPREHENSIVE METABOLIC PANEL (CC13)
ALK PHOS: 71 U/L (ref 40–150)
ALT: 14 U/L (ref 0–55)
AST: 14 U/L (ref 5–34)
Albumin: 3.5 g/dL (ref 3.5–5.0)
Anion Gap: 8 mEq/L (ref 3–11)
BILIRUBIN TOTAL: 0.42 mg/dL (ref 0.20–1.20)
BUN: 26.4 mg/dL — ABNORMAL HIGH (ref 7.0–26.0)
CO2: 25 mEq/L (ref 22–29)
Calcium: 9.1 mg/dL (ref 8.4–10.4)
Chloride: 109 mEq/L (ref 98–109)
Creatinine: 1.9 mg/dL — ABNORMAL HIGH (ref 0.7–1.3)
Glucose: 118 mg/dl (ref 70–140)
Potassium: 4.1 mEq/L (ref 3.5–5.1)
Sodium: 142 mEq/L (ref 136–145)
TOTAL PROTEIN: 6.6 g/dL (ref 6.4–8.3)

## 2014-04-19 LAB — CBC & DIFF AND RETIC
BASO%: 0.3 % (ref 0.0–2.0)
BASOS ABS: 0 10*3/uL (ref 0.0–0.1)
EOS ABS: 0.1 10*3/uL (ref 0.0–0.5)
EOS%: 2.3 % (ref 0.0–7.0)
HCT: 36.9 % — ABNORMAL LOW (ref 38.4–49.9)
HEMOGLOBIN: 11.9 g/dL — AB (ref 13.0–17.1)
Immature Retic Fract: 17.6 % — ABNORMAL HIGH (ref 3.00–10.60)
LYMPH#: 1.7 10*3/uL (ref 0.9–3.3)
LYMPH%: 43.1 % (ref 14.0–49.0)
MCH: 30.3 pg (ref 27.2–33.4)
MCHC: 32.2 g/dL (ref 32.0–36.0)
MCV: 93.9 fL (ref 79.3–98.0)
MONO#: 0.5 10*3/uL (ref 0.1–0.9)
MONO%: 11.5 % (ref 0.0–14.0)
NEUT%: 42.8 % (ref 39.0–75.0)
NEUTROS ABS: 1.7 10*3/uL (ref 1.5–6.5)
Platelets: 73 10*3/uL — ABNORMAL LOW (ref 140–400)
RBC: 3.93 10*6/uL — ABNORMAL LOW (ref 4.20–5.82)
RDW: 24.4 % — ABNORMAL HIGH (ref 11.0–14.6)
RETIC %: 1.91 % — AB (ref 0.80–1.80)
Retic Ct Abs: 75.06 10*3/uL (ref 34.80–93.90)
WBC: 4 10*3/uL (ref 4.0–10.3)
nRBC: 1 % — ABNORMAL HIGH (ref 0–0)

## 2014-04-19 LAB — LACTATE DEHYDROGENASE (CC13): LDH: 240 U/L (ref 125–245)

## 2014-04-19 LAB — TECHNOLOGIST REVIEW

## 2014-04-19 NOTE — Assessment & Plan Note (Signed)
This is likely anemia of chronic disease. The patient denies recent history of bleeding such as epistaxis, hematuria or hematochezia. He is asymptomatic from the anemia. We will observe for now.  He does not require transfusion now.   

## 2014-04-19 NOTE — Telephone Encounter (Signed)
s.w. pt wife and advised on April 2016 appt....Marland Kitchenok adn aware

## 2014-04-19 NOTE — Progress Notes (Signed)
[facility] OFFICE PROGRESS NOTE  Patient Care Team: [name], MD as PCP - General (Family Medicine) Heath Lark, MD as Consulting Physician (Hematology and Oncology)  SUMMARY OF ONCOLOGIC HISTORY: Ricardo Watkins was transferred to my care after his prior physician has left.  I reviewed the patient's records extensive and collaborated the history with the patient. Summary of his history is as follows: He was initially diagnosed with follicular grade 2, B-cell, non-Hodgkin's lymphoma in August 2000 when he presented with bulky retroperitoneal and periaortic lymphadenopathy. He achieved a near complete response to 4 cycles of fludarabine, mitoxantrone, and dexamethasone. Due to significant myelosuppression, chemotherapy was stopped after 4 cycles. He was put on consolidation therapy with Rituxan weekly x4. All treatments were completed by 07/21/1999. He has had no signs of recurrent lymphoma since that time.  Over time he has developed other problems. He developed cancer of the prostate Gleason 3+3 right lobe, 3+ 4 left lobe, in May of 2007 treated with radioactive iodine seed implants.  He developed a modest fall in his platelet count 4 years ago consistent with chronic ITP. Platelets have stayed at or around 100,000.  He had a stroke in July 2012. There was an acute infarction in the genu and posterior limb of the right anterior capsule. 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  He has an elevated MCV of 105, a monocytosis up to 16%, borderline elevation of LDH up to 303, low bilirubin 0.35, normal serum immunoglobulin levels and no monoclonal proteins on immunofixation electrophoresis. He had borderline decreased B12 level 194, back in April. He was started on B12 injections without any major improvement in his degree of anemia. Iron studies normal with high normal ferritin 317. Findings above are consistent with a myelodysplastic syndrome likely  related to his remote chemotherapy treatments. Hemoglobin level has stabilized at 13 g. Platelets and white count stable. A CT scan done subsequent to his April 2014 visit did not show any evidence for recurrent lymphoma.  INTERVAL HISTORY: Please see below for problem oriented charting. He feels well. Denies new lymphadenopathy. He bruises easily. The patient denies any recent signs or symptoms of bleeding such as spontaneous epistaxis, hematuria or hematochezia. He denies recent infection. He has chronic bilateral edema.  REVIEW OF SYSTEMS:   Constitutional: Denies fevers, chills or abnormal weight loss Eyes: Denies blurriness of vision Ears, nose, mouth, throat, and face: Denies mucositis or sore throat Respiratory: Denies cough, dyspnea or wheezes Cardiovascular: Denies palpitation, chest discomfort  Gastrointestinal:  Denies nausea, heartburn or change in bowel habits Skin: Denies abnormal skin rashes Lymphatics: Denies new lymphadenopathy  Neurological:Denies numbness, tingling or new weaknesses Behavioral/Psych: Mood is stable, no new changes  All other systems were reviewed with the patient and are negative.  I have reviewed the past medical history, past surgical history, social history and family history with the patient and they are unchanged from previous note.  ALLERGIES:  is allergic to glucophage; zetia; fenofibrate; and niacin-lovastatin er.  MEDICATIONS:  Current Outpatient Prescriptions  Medication Sig Dispense Refill  . acetaminophen (TYLENOL) 500 MG tablet Take 500 mg by mouth every 6 (six) hours as needed for pain.      Marland Kitchen amLODipine (NORVASC) 10 MG tablet TAKE 1 TABLET BY MOUTH EVERY DAY  90 tablet  3  . atorvastatin (LIPITOR) 10 MG tablet TAKE 1/2 TABLET BY MOUTH DAILY  30 tablet  2  . clopidogrel (PLAVIX) 75 MG tablet Take 1 tablet (  75 mg total) by mouth daily with breakfast.  90 tablet  1  . cyanocobalamin (,VITAMIN B-12,) 1000 MCG/ML injection Inject 1 mL  (1,000 mcg total) into the muscle once.  1 mL  0  . LANTUS SOLOSTAR 100 UNIT/ML Solostar Pen INJECT 25 UNITS INTO THE SKIN AT BEDTIME.  15 pen  5  . metoprolol succinate (TOPROL-XL) 100 MG 24 hr tablet TAKE 1 TABLET (100 MG TOTAL) BY MOUTH DAILY. TAKE WITH OR IMMEDIATELY FOLLOWING A MEAL.  90 tablet  3  . oxybutynin (DITROPAN-XL) 5 MG 24 hr tablet Take 10 mg by mouth at bedtime.       . pioglitazone (ACTOS) 30 MG tablet TAKE 1 TABLET EVERY DAY  90 tablet  3  . quinapril (ACCUPRIL) 40 MG tablet TAKE 1 TABLET BY MOUTH EVERY DAY  90 tablet  2  . Tamsulosin HCl (FLOMAX) 0.4 MG CAPS Take 0.4 mg by mouth daily.        . [DISCONTINUED] cloNIDine (CATAPRES) 0.1 MG tablet Take 0.1 mg by mouth 2 (two) times daily.         No current facility-administered medications for this visit.    PHYSICAL EXAMINATION: ECOG PERFORMANCE STATUS: 1 - Symptomatic but completely ambulatory  Filed Vitals:   04/19/14 1235  BP: 118/50  Pulse: 64  Temp: 98.2 F (36.8 C)  Resp: 21   Filed Weights   04/19/14 1235  Weight: 220 lb 12.8 oz (100.154 kg)    GENERAL:alert, no distress and comfortable. He is morbidly obese SKIN: skin color, texture, turgor are normal, no rashes or significant lesions. No skin bruises EYES: normal, Conjunctiva are pink and non-injected, sclera clear OROPHARYNX:no exudate, no erythema and lips, buccal mucosa, and tongue normal  NECK: supple, thyroid normal size, non-tender, without nodularity LYMPH:  no palpable lymphadenopathy in the cervical, axillary or inguinal LUNGS: clear to auscultation and percussion with normal breathing effort HEART: regular rate & rhythm and no murmurs with bilateral moderate lower extremity edema ABDOMEN:abdomen soft, non-tender and normal bowel sounds Musculoskeletal:no cyanosis of digits and no clubbing  NEURO: alert & oriented x 3 with fluent speech, no focal motor/sensory deficits  LABORATORY DATA:  I have reviewed the data as listed    Component  Value Date/Time   NA 142 04/19/2014 1218   NA 143 12/14/2013 0843   K 4.1 04/19/2014 1218   K 4.2 12/14/2013 0843   CL 108 12/14/2013 0843   CL 107 09/27/2012 0940   CO2 25 04/19/2014 1218   CO2 24 12/14/2013 0843   GLUCOSE 118 04/19/2014 1218   GLUCOSE 80 12/14/2013 0843   GLUCOSE 89 09/27/2012 0940   BUN 26.4* 04/19/2014 1218   BUN 25* 12/14/2013 0843   CREATININE 1.9* 04/19/2014 1218   CREATININE 1.75* 12/14/2013 0843   CREATININE 1.64* 09/10/2011 1204   CALCIUM 9.1 04/19/2014 1218   CALCIUM 8.6 12/14/2013 0843   PROT 6.6 04/19/2014 1218   PROT 6.7 12/14/2013 0843   ALBUMIN 3.5 04/19/2014 1218   ALBUMIN 3.7 12/14/2013 0843   AST 14 04/19/2014 1218   AST 14 12/14/2013 0843   ALT 14 04/19/2014 1218   ALT 12 12/14/2013 0843   ALKPHOS 71 04/19/2014 1218   ALKPHOS 63 12/14/2013 0843   BILITOT 0.42 04/19/2014 1218   BILITOT 0.4 12/14/2013 0843   GFRNONAA 36* 12/14/2013 0843   GFRNONAA 51* 01/19/2011 0605   GFRAA 41* 12/14/2013 0843   GFRAA >60 01/19/2011 0605    No results found for this basename:  SPEP,  UPEP,   kappa and lambda light chains    Lab Results  Component Value Date   WBC 4.0 04/19/2014   NEUTROABS 1.7 04/19/2014   HGB 11.9* 04/19/2014   HCT 36.9* 04/19/2014   MCV 93.9 04/19/2014   PLT 73* 04/19/2014      Chemistry      Component Value Date/Time   NA 142 04/19/2014 1218   NA 143 12/14/2013 0843   K 4.1 04/19/2014 1218   K 4.2 12/14/2013 0843   CL 108 12/14/2013 0843   CL 107 09/27/2012 0940   CO2 25 04/19/2014 1218   CO2 24 12/14/2013 0843   BUN 26.4* 04/19/2014 1218   BUN 25* 12/14/2013 0843   CREATININE 1.9* 04/19/2014 1218   CREATININE 1.75* 12/14/2013 0843   CREATININE 1.64* 09/10/2011 1204      Component Value Date/Time   CALCIUM 9.1 04/19/2014 1218   CALCIUM 8.6 12/14/2013 0843   ALKPHOS 71 04/19/2014 1218   ALKPHOS 63 12/14/2013 0843   AST 14 04/19/2014 1218   AST 14 12/14/2013 0843   ALT 14 04/19/2014 1218   ALT 12 12/14/2013 0843   BILITOT 0.42 04/19/2014  1218   BILITOT 0.4 12/14/2013 0843     ASSESSMENT & PLAN:  Non-Hodgkin lymphoma Clinically, he has no evidence of disease recurrence. Continue history, physical examination and blood work next year.  Deficiency anemia This is likely anemia of chronic disease. The patient denies recent history of bleeding such as epistaxis, hematuria or hematochezia. He is asymptomatic from the anemia. We will observe for now.  He does not require transfusion now.    Thrombocytopenia This is chronic, could be related to an diagnosed myelodysplastic syndrome due to prior exposure to chemotherapy. He is currently getting vitamin B-12 replacement therapy. He is not symptomatic. Recommend observation only. I do not recommend repeat bone marrow biopsy unless he needs treatment.  Prostate ca Clinically, he has no evidence of recurrence. I would defer to the urologist for future followup.    Vitamin B12 deficiency He is currently receiving vitamin B 12 injection via his primary care provider.  Chronic renal failure, stage 3 (moderate) This is likely due to chronic hypertension and peripheral vascular disease. He is currently on medical management.     Orders Placed This Encounter  Procedures  . Comprehensive metabolic panel    Standing Status: Future     Number of Occurrences:      Standing Expiration Date: 05/24/2015  . CBC & Diff and Retic    Standing Status: Future     Number of Occurrences:      Standing Expiration Date: 05/24/2015  . Lactate dehydrogenase    Standing Status: Future     Number of Occurrences:      Standing Expiration Date: 05/24/2015  . Morphology    Standing Status: Future     Number of Occurrences:      Standing Expiration Date: 05/24/2015   All questions were answered. The patient knows to call the clinic with any problems, questions or concerns. No barriers to learning was detected. I spent 25 minutes counseling the patient face to face. The total time spent in the  appointment was 40 minutes and more than 50% was on counseling and review of test results     [facility], [place], MD 04/19/2014 3:48 PM

## 2014-04-19 NOTE — Assessment & Plan Note (Signed)
Clinically, he has no evidence of recurrence. I would defer to the urologist for future followup.

## 2014-04-19 NOTE — Assessment & Plan Note (Signed)
This is likely due to chronic hypertension and peripheral vascular disease. He is currently on medical management.

## 2014-04-19 NOTE — Assessment & Plan Note (Signed)
He is currently receiving vitamin B 12 injection via his primary care provider.

## 2014-04-19 NOTE — Assessment & Plan Note (Signed)
This is chronic, could be related to an diagnosed myelodysplastic syndrome due to prior exposure to chemotherapy. He is currently getting vitamin B-12 replacement therapy. He is not symptomatic. Recommend observation only. I do not recommend repeat bone marrow biopsy unless he needs treatment.

## 2014-04-19 NOTE — Assessment & Plan Note (Signed)
Clinically, he has no evidence of disease recurrence. Continue history, physical examination and blood work next year.

## 2014-04-25 ENCOUNTER — Ambulatory Visit (INDEPENDENT_AMBULATORY_CARE_PROVIDER_SITE_OTHER): Payer: Medicare Other | Admitting: *Deleted

## 2014-04-25 DIAGNOSIS — E538 Deficiency of other specified B group vitamins: Secondary | ICD-10-CM

## 2014-04-25 MED ORDER — CYANOCOBALAMIN 1000 MCG/ML IJ SOLN
1000.0000 ug | Freq: Once | INTRAMUSCULAR | Status: AC
  Administered 2014-04-25: 1000 ug via INTRAMUSCULAR

## 2014-04-25 NOTE — Progress Notes (Signed)
Patient ID: Ricardo Watkins, male   DOB: January 20, 1931, 80 y.o.   MRN: [id number] Patient seen in office for vitamin B12 injection.   Tolerated IM administration well.

## 2014-04-25 NOTE — Patient Instructions (Signed)
Return in 1 month for next injection

## 2014-05-24 ENCOUNTER — Ambulatory Visit (INDEPENDENT_AMBULATORY_CARE_PROVIDER_SITE_OTHER): Payer: Medicare Other | Admitting: Family Medicine

## 2014-05-24 DIAGNOSIS — D519 Vitamin B12 deficiency anemia, unspecified: Secondary | ICD-10-CM | POA: Diagnosis not present

## 2014-05-24 MED ORDER — CYANOCOBALAMIN 1000 MCG/ML IJ SOLN
1000.0000 ug | Freq: Once | INTRAMUSCULAR | Status: AC
  Administered 2014-05-24: 1000 ug via INTRAMUSCULAR

## 2014-06-17 ENCOUNTER — Encounter: Payer: Self-pay | Admitting: Family Medicine

## 2014-06-17 ENCOUNTER — Ambulatory Visit (INDEPENDENT_AMBULATORY_CARE_PROVIDER_SITE_OTHER): Payer: Medicare Other | Admitting: Family Medicine

## 2014-06-17 VITALS — BP 100/70 | HR 86 | Temp 97.9°F | Resp 18 | Ht 69.0 in | Wt 218.0 lb

## 2014-06-17 DIAGNOSIS — I10 Essential (primary) hypertension: Secondary | ICD-10-CM | POA: Diagnosis not present

## 2014-06-17 DIAGNOSIS — I2510 Atherosclerotic heart disease of native coronary artery without angina pectoris: Secondary | ICD-10-CM

## 2014-06-17 DIAGNOSIS — Z794 Long term (current) use of insulin: Secondary | ICD-10-CM

## 2014-06-17 DIAGNOSIS — I6529 Occlusion and stenosis of unspecified carotid artery: Secondary | ICD-10-CM

## 2014-06-17 DIAGNOSIS — E119 Type 2 diabetes mellitus without complications: Secondary | ICD-10-CM

## 2014-06-17 DIAGNOSIS — IMO0001 Reserved for inherently not codable concepts without codable children: Secondary | ICD-10-CM

## 2014-06-17 LAB — COMPLETE METABOLIC PANEL WITH GFR
ALBUMIN: 4.1 g/dL (ref 3.5–5.2)
ALT: 14 U/L (ref 0–53)
AST: 14 U/L (ref 0–37)
Alkaline Phosphatase: 70 U/L (ref 39–117)
BUN: 26 mg/dL — ABNORMAL HIGH (ref 6–23)
CO2: 24 mEq/L (ref 19–32)
Calcium: 9.1 mg/dL (ref 8.4–10.5)
Chloride: 106 mEq/L (ref 96–112)
Creat: 1.74 mg/dL — ABNORMAL HIGH (ref 0.50–1.35)
GFR, EST NON AFRICAN AMERICAN: 36 mL/min — AB
GFR, Est African American: 42 mL/min — ABNORMAL LOW
GLUCOSE: 122 mg/dL — AB (ref 70–99)
Potassium: 4.2 mEq/L (ref 3.5–5.3)
Sodium: 141 mEq/L (ref 135–145)
Total Bilirubin: 0.5 mg/dL (ref 0.2–1.2)
Total Protein: 6.8 g/dL (ref 6.0–8.3)

## 2014-06-17 LAB — LIPID PANEL
CHOL/HDL RATIO: 4 ratio
CHOLESTEROL: 127 mg/dL (ref 0–200)
HDL: 32 mg/dL — AB (ref >39)
LDL Cholesterol: 57 mg/dL (ref 0–99)
Triglycerides: 191 mg/dL — ABNORMAL HIGH (ref <150)
VLDL: 38 mg/dL (ref 0–40)

## 2014-06-17 LAB — HEMOGLOBIN A1C
HEMOGLOBIN A1C: 6.6 % — AB (ref <5.7)
Mean Plasma Glucose: 143 mg/dL — ABNORMAL HIGH (ref <117)

## 2014-06-17 LAB — MICROALBUMIN, URINE: Microalb, Ur: 1.8 mg/dL (ref <2.0)

## 2014-06-17 NOTE — Progress Notes (Signed)
Subjective:    Patient ID: Ricardo Watkins, male    DOB: 08-28-1930, 80 y.o.   MRN: [id number]  HPI Patient is a very pleasant 80 year old white male with a history of insulin-dependent diabetes mellitus. He is currently on a combination of Lantus as well as Actos for an insulin sensitizer. His fasting blood sugars are very consistent 80-90. His two-hour postprandial sugars are very consistent 100-120. He has no evidence of hypoglycemia. He is tolerating his medication well. His diabetic eye exam was less than 6 months ago. His diabetic foot exam is performed today and is significant for thick dystrophic toenail which requires trimming and removal. Given the size and the deformity I would recommend a podiatry consult for this. Patient's immunizations including his flu shot and pneumonia shot are up-to-date. He is due for hemoglobin A1c as well as a urine microalbumin. His blood pressure is excellent at 100/70. He denies any chest pain shortness of breath or dyspnea on exertion. He denies any myalgias or right upper quadrant pain on his statin medication. Past Medical History  Diagnosis Date  . PVD (peripheral vascular disease)     rt renal artery stent  . CAD (coronary artery disease)     30% LAD Stenosis, 70% ramus intermedius stenosis, treated with PTCA and angioplasty by Dr Albertine Patricia 2004  . DM (diabetes mellitus)   . Dyslipidemia   . Renal insufficiency   . Thrombocytopenia   . Hypertension   . Vitamin D deficiency   . Non Hodgkin's lymphoma   . Inguinal hernia     right  . Adenomatous colon polyp   . Cataract     right eye  . Hyperlipidemia   . Prostate cancer     seed implants no recurrence  . 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 Prostate ca 09/10/2011    Gleason 3+3 R, 3+4 L lobe May 2007 Rx Radioactive seed implants Dr [name]  . DVT (deep venous thrombosis)    secondary to surgery  . UTI (urinary tract infection)   . 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 Cancer     lymphoma  . Deficiency anemia 04/19/2014  . Thrombocytopenia 04/19/2014   Past Surgical History  Procedure Laterality Date  . Cystourethroscopy      ROBOTIC ARM NUCLETRON SEED IMPLANTATION OF PROSTATE  . Exploratory laparotomy      For evaluation of lymphoma  . Colonoscopy    . Appendectomy     Current Outpatient Prescriptions on File Prior to Visit  Medication Sig Dispense Refill  . acetaminophen (TYLENOL) 500 MG tablet Take 500 mg by mouth every 6 (six) hours as needed for pain.    Marland Kitchen amLODipine (NORVASC) 10 MG tablet TAKE 1 TABLET BY MOUTH EVERY DAY 90 tablet 3  . atorvastatin (LIPITOR) 10 MG tablet TAKE 1/2 TABLET BY MOUTH DAILY 30 tablet 2  . clopidogrel (PLAVIX) 75 MG tablet Take 1 tablet (75 mg total) by mouth daily with breakfast. 90 tablet 1  . cyanocobalamin (,VITAMIN B-12,) 1000 MCG/ML injection Inject 1 mL (1,000 mcg total) into the muscle once. 1 mL 0  . LANTUS SOLOSTAR 100 UNIT/ML Solostar Pen INJECT 25 UNITS INTO THE SKIN AT BEDTIME. (Patient taking differently: INJECT 18 UNITS INTO THE SKIN AT BEDTIME.) 15 pen  5  . metoprolol succinate (TOPROL-XL) 100 MG 24 hr tablet TAKE 1 TABLET (100 MG TOTAL) BY MOUTH DAILY. TAKE WITH OR IMMEDIATELY FOLLOWING A MEAL. 90 tablet 3  . oxybutynin (DITROPAN-XL) 5 MG 24 hr tablet Take 10 mg by mouth at bedtime.     . pioglitazone (ACTOS) 30 MG tablet TAKE 1 TABLET EVERY DAY 90 tablet 3  . quinapril (ACCUPRIL) 40 MG tablet TAKE 1 TABLET BY MOUTH EVERY DAY 90 tablet 2  . Tamsulosin HCl (FLOMAX) 0.4 MG CAPS Take 0.4 mg by mouth daily.      . [DISCONTINUED] cloNIDine (CATAPRES) 0.1 MG tablet Take 0.1 mg by mouth 2 (two) times daily.       No current facility-administered medications on file prior to visit.   Allergies  Allergen Reactions  . Glucophage  [Metformin Hydrochloride]     Chest pain  . Zetia [Ezetimibe]     weakness  . Fenofibrate Rash  . Niacin-Lovastatin Er Rash   History   Social History  . Marital Status: Married    Spouse Name: N/A    Number of Children: N/A  . Years of Education: N/A   Occupational History  . Not on file.   Social History Main Topics  . Smoking status: Former Research scientist (life sciences)  . Smokeless tobacco: Never Used     Comment: Quit 30 years ago.  . Alcohol Use: No  . Drug Use: No  . Sexual Activity:    Partners: Female   Other Topics Concern  . Not on file   Social History Narrative      Review of Systems  All other systems reviewed and are negative.      Objective:   Physical Exam  Constitutional: He appears well-developed and well-nourished.  HENT:  Mouth/Throat: No oropharyngeal exudate.  Eyes: Conjunctivae are normal. No scleral icterus.  Neck: Neck supple. No JVD present. No thyromegaly present.  Cardiovascular: Normal rate, regular rhythm and normal heart sounds.  Exam reveals no gallop and no friction rub.   No murmur heard. Pulmonary/Chest: Effort normal and breath sounds normal. No respiratory distress. He has no wheezes. He has no rales.  Abdominal: Soft. Bowel sounds are normal. He exhibits no distension and no mass. There is no tenderness. There is no rebound and no guarding.  Musculoskeletal: He exhibits edema.  Lymphadenopathy:    He has no cervical adenopathy.  Skin: He is not diaphoretic.  Vitals reviewed.         Assessment & Plan:  Essential hypertension  IDDM (insulin dependent diabetes mellitus) - Plan: COMPLETE METABOLIC PANEL WITH GFR, Hemoglobin A1c, Lipid panel, Microalbumin, urine, Ambulatory referral to Podiatry  ASCVD (arteriosclerotic cardiovascular disease)  I'm very happy with the patient's blood pressure. I will check a fasting lipid panel. Goal LDL cholesterol is less than 70. Her hemoglobin A1c is less than 6.5. I will also check a urine  microalbumin to check for nephropathy. Diabetic eye exam is up-to-date. I will schedule the patient to see a podiatrist for trimming of dystrophic toenail on his left great troche. I believe if we do not do this soon and will start to irritate and ulcerate the dorsum of his second toe. Recheck in 6 months.

## 2014-06-18 ENCOUNTER — Encounter: Payer: Self-pay | Admitting: Family Medicine

## 2014-06-25 ENCOUNTER — Ambulatory Visit (INDEPENDENT_AMBULATORY_CARE_PROVIDER_SITE_OTHER): Payer: Medicare Other | Admitting: *Deleted

## 2014-06-25 DIAGNOSIS — D519 Vitamin B12 deficiency anemia, unspecified: Secondary | ICD-10-CM

## 2014-06-25 MED ORDER — CYANOCOBALAMIN 1000 MCG/ML IJ SOLN
1000.0000 ug | Freq: Once | INTRAMUSCULAR | Status: AC
  Administered 2014-06-25: 1000 ug via INTRAMUSCULAR

## 2014-06-27 ENCOUNTER — Other Ambulatory Visit: Payer: Self-pay | Admitting: Family Medicine

## 2014-06-27 NOTE — Telephone Encounter (Signed)
Refill appropriate and filled per protocol. 

## 2014-06-28 ENCOUNTER — Encounter: Payer: Self-pay | Admitting: Cardiology

## 2014-06-28 ENCOUNTER — Ambulatory Visit (INDEPENDENT_AMBULATORY_CARE_PROVIDER_SITE_OTHER): Payer: Medicare Other | Admitting: Cardiology

## 2014-06-28 VITALS — BP 130/70 | HR 74 | Ht 69.0 in | Wt 223.3 lb

## 2014-06-28 DIAGNOSIS — I10 Essential (primary) hypertension: Secondary | ICD-10-CM

## 2014-06-28 DIAGNOSIS — I2510 Atherosclerotic heart disease of native coronary artery without angina pectoris: Secondary | ICD-10-CM

## 2014-06-28 NOTE — Progress Notes (Signed)
HPI  The patient presents for followup of his known coronary disease. Since I last saw him he has had some increased lower extremity swelling he thinks and some increased leg swelling.he's had no new He's had no new chest discomfort since a stress perfusion study in 2012. He doesn't have any palpitations, presyncope or syncope. He has no PND or orthopnea. He does little activity and has been vacuuming.   Allergies  Allergen Reactions  . Glucophage [Metformin Hydrochloride]     Chest pain  . Zetia [Ezetimibe]     weakness  . Fenofibrate Rash  . Niacin-Lovastatin Er Rash    Current Outpatient Prescriptions  Medication Sig Dispense Refill  . acetaminophen (TYLENOL) 500 MG tablet Take 500 mg by mouth every 6 (six) hours as needed for pain.    Marland Kitchen amLODipine (NORVASC) 10 MG tablet TAKE 1 TABLET BY MOUTH EVERY DAY 90 tablet 3  . atorvastatin (LIPITOR) 10 MG tablet TAKE 1/2 TABLET BY MOUTH DAILY 30 tablet 2  . clopidogrel (PLAVIX) 75 MG tablet TAKE 1 TABLET BY MOUTH DAILY WITH BREAKFAST. 90 tablet 1  . cyanocobalamin (,VITAMIN B-12,) 1000 MCG/ML injection Inject 1 mL (1,000 mcg total) into the muscle once. 1 mL 0  . LANTUS SOLOSTAR 100 UNIT/ML Solostar Pen INJECT 25 UNITS INTO THE SKIN AT BEDTIME. (Patient taking differently: INJECT 18 UNITS INTO THE SKIN AT BEDTIME.) 15 pen 5  . metoprolol succinate (TOPROL-XL) 100 MG 24 hr tablet TAKE 1 TABLET (100 MG TOTAL) BY MOUTH DAILY. TAKE WITH OR IMMEDIATELY FOLLOWING A MEAL. 90 tablet 3  . oxybutynin (DITROPAN-XL) 5 MG 24 hr tablet Take 10 mg by mouth at bedtime.     . pioglitazone (ACTOS) 30 MG tablet TAKE 1 TABLET EVERY DAY 90 tablet 3  . quinapril (ACCUPRIL) 40 MG tablet TAKE 1 TABLET BY MOUTH EVERY DAY 90 tablet 2  . Tamsulosin HCl (FLOMAX) 0.4 MG CAPS Take 0.4 mg by mouth daily.      . [DISCONTINUED] cloNIDine (CATAPRES) 0.1 MG tablet Take 0.1 mg by mouth 2 (two) times daily.       No current facility-administered medications for this visit.     Past Medical History  Diagnosis Date  . PVD (peripheral vascular disease)     rt renal artery stent  . CAD (coronary artery disease)     30% LAD Stenosis, 70% ramus intermedius stenosis, treated with PTCA and angioplasty by Dr Albertine Patricia 2004  . DM (diabetes mellitus)   . Dyslipidemia   . Renal insufficiency   . Thrombocytopenia   . Hypertension   . Vitamin D deficiency   . Non Hodgkin's lymphoma   . Inguinal hernia     right  . Adenomatous colon polyp   . Cataract     right eye  . Hyperlipidemia   . Prostate cancer     seed implants no recurrence  . 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 Prostate ca 09/10/2011    Gleason 3+3 R, 3+4 L lobe May 2007 Rx Radioactive seed implants Dr [name]  . DVT (deep venous thrombosis)     secondary to surgery  . UTI (urinary tract infection)   . 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 Cancer     lymphoma  . Deficiency anemia  04/19/2014  . Thrombocytopenia 04/19/2014    Past Surgical History  Procedure Laterality Date  . Cystourethroscopy      ROBOTIC ARM NUCLETRON SEED IMPLANTATION OF PROSTATE  . Exploratory laparotomy      For evaluation of lymphoma  . Colonoscopy    . Appendectomy      ROS:  As stated in the HPI and negative for all other systems.  PHYSICAL EXAM BP 130/70 mmHg  Pulse 74  Ht 5\' 9"  (1.753 m)  Wt 223 lb 4.8 oz (101.288 kg)  BMI 32.96 kg/m2 GENERAL:  Well appearing HEENT:  Pupils equal round and reactive, fundi not visualized, oral mucosa unremarkable NECK:  No jugular venous distention, waveform within normal limits, carotid upstroke brisk and symmetric, positive bilateral soft bruits versus transmitted systolic murmurs, no thyromegaly LYMPHATICS:  No cervical, inguinal adenopathy LUNGS:  Clear to auscultation  bilaterally BACK:  No CVA tenderness CHEST:  Unremarkable HEART:  PMI not displaced or sustained,S1 and S2 within normal limits, no S3, no S4, no clicks, no rubs, 2/6 apical systolic murmur radiating out the aortic outflow tract short and early peaking, no diastolic murmurs ABD:  Flat, positive bowel sounds normal in frequency in pitch, no bruits, no rebound, no guarding, no midline pulsatile mass, no hepatomegaly, no splenomegaly EXT:  2 plus pulses throughout,  mild to moderate bilateral lower extremityedema, no cyanosis no clubbing SKIN:  No rashes no nodules With the he does not have an EKG that I saw ina  EKG:  Sinus rhythm, rate 64 axis within normal limits, intervals within normal limits, poor anterior R wave progression, nonspecific T-wave flattening, premature ventricular contractions. 06/28/2014  ASSESSMENT AND PLAN  CAD:  The patient has no new sypmtoms.  No further cardiovascular testing is indicated.  We will continue with aggressive risk reduction and meds as listed.  BRUIT:  He will have carotid Doppler.  HYPERLIPIDMEIA:  His LDL in July was 87.  He will continue with the meds as listed.  CKD:  Creat has been stable.  EDEMA:  We discussed conservative therapies.

## 2014-06-28 NOTE — Patient Instructions (Signed)
Your physician recommends that you schedule a follow-up appointment in: 6 months after you have your doppler studies

## 2014-07-01 ENCOUNTER — Ambulatory Visit: Payer: Medicare Other | Admitting: Podiatry

## 2014-07-01 NOTE — Addendum Note (Signed)
Addended by: Di Kindle D on: 07/01/2014 03:01 PM   Modules accepted: Orders

## 2014-07-02 ENCOUNTER — Ambulatory Visit (HOSPITAL_BASED_OUTPATIENT_CLINIC_OR_DEPARTMENT_OTHER)
Admission: RE | Admit: 2014-07-02 | Discharge: 2014-07-02 | Disposition: A | Discharge status: Home / Self Care | Payer: Medicare Other | Source: Ambulatory Visit | Attending: Cardiovascular Disease | Admitting: Cardiovascular Disease

## 2014-07-02 ENCOUNTER — Ambulatory Visit (HOSPITAL_COMMUNITY)
Admission: RE | Admit: 2014-07-02 | Discharge: 2014-07-02 | Disposition: A | Discharge status: Home / Self Care | Payer: Medicare Other | Source: Ambulatory Visit | Attending: Cardiology | Admitting: Cardiology

## 2014-07-02 DIAGNOSIS — I779 Disorder of arteries and arterioles, unspecified: Secondary | ICD-10-CM | POA: Diagnosis not present

## 2014-07-02 DIAGNOSIS — I10 Essential (primary) hypertension: Secondary | ICD-10-CM | POA: Diagnosis not present

## 2014-07-02 DIAGNOSIS — R011 Cardiac murmur, unspecified: Secondary | ICD-10-CM | POA: Insufficient documentation

## 2014-07-02 DIAGNOSIS — I739 Peripheral vascular disease, unspecified: Principal | ICD-10-CM

## 2014-07-02 DIAGNOSIS — I2510 Atherosclerotic heart disease of native coronary artery without angina pectoris: Secondary | ICD-10-CM

## 2014-07-02 DIAGNOSIS — E119 Type 2 diabetes mellitus without complications: Secondary | ICD-10-CM | POA: Insufficient documentation

## 2014-07-02 DIAGNOSIS — E785 Hyperlipidemia, unspecified: Secondary | ICD-10-CM | POA: Insufficient documentation

## 2014-07-02 NOTE — Progress Notes (Signed)
2D Echocardiogram Complete.  07/02/2014   Lexus Shampine, RDCS  

## 2014-07-02 NOTE — Progress Notes (Signed)
Carotid Duplex Completed. Stable left ICA stenosis of 50-69%. Oda Cogan, BS, RDMS, RVT

## 2014-07-10 ENCOUNTER — Encounter: Payer: Self-pay | Admitting: Cardiology

## 2014-07-12 ENCOUNTER — Ambulatory Visit: Payer: Medicare Other | Admitting: Podiatry

## 2014-07-26 ENCOUNTER — Ambulatory Visit (INDEPENDENT_AMBULATORY_CARE_PROVIDER_SITE_OTHER): Payer: Medicare Other | Admitting: Podiatry

## 2014-07-26 ENCOUNTER — Encounter: Payer: Self-pay | Admitting: Podiatry

## 2014-07-26 ENCOUNTER — Ambulatory Visit (INDEPENDENT_AMBULATORY_CARE_PROVIDER_SITE_OTHER): Payer: Medicare Other | Admitting: Family Medicine

## 2014-07-26 VITALS — BP 129/67 | HR 61 | Resp 16

## 2014-07-26 DIAGNOSIS — B351 Tinea unguium: Secondary | ICD-10-CM

## 2014-07-26 DIAGNOSIS — E538 Deficiency of other specified B group vitamins: Secondary | ICD-10-CM | POA: Diagnosis not present

## 2014-07-26 DIAGNOSIS — I2510 Atherosclerotic heart disease of native coronary artery without angina pectoris: Secondary | ICD-10-CM

## 2014-07-26 DIAGNOSIS — M79676 Pain in unspecified toe(s): Secondary | ICD-10-CM

## 2014-07-26 MED ORDER — CYANOCOBALAMIN 1000 MCG/ML IJ SOLN: 1000.0000 ug | Freq: Once | INTRAMUSCULAR | Status: DC

## 2014-07-26 MED ORDER — CYANOCOBALAMIN 1000 MCG/ML IJ SOLN
1000.0000 ug | Freq: Once | INTRAMUSCULAR | Status: AC
  Administered 2014-07-26: 1000 ug via INTRAMUSCULAR

## 2014-07-26 NOTE — Patient Instructions (Signed)
Diabetes and Foot Care Diabetes may cause you to have problems because of poor blood supply (circulation) to your feet and legs. This may cause the skin on your feet to become thinner, break easier, and heal more slowly. Your skin may become dry, and the skin may peel and crack. You may also have nerve damage in your legs and feet causing decreased feeling in them. You may not notice minor injuries to your feet that could lead to infections or more serious problems. Taking care of your feet is one of the most important things you can do for yourself.  HOME CARE INSTRUCTIONS  Wear shoes at all times, even in the house. Do not go barefoot. Bare feet are easily injured.  Check your feet daily for blisters, cuts, and redness. If you cannot see the bottom of your feet, use a mirror or ask someone for help.  Wash your feet with warm water (do not use hot water) and mild soap. Then pat your feet and the areas between your toes until they are completely dry. Do not soak your feet as this can dry your skin.  Apply a moisturizing lotion or petroleum jelly (that does not contain alcohol and is unscented) to the skin on your feet and to dry, brittle toenails. Do not apply lotion between your toes.  Trim your toenails straight across. Do not dig under them or around the cuticle. File the edges of your nails with an emery board or nail file.  Do not cut corns or calluses or try to remove them with medicine.  Wear clean socks or stockings every day. Make sure they are not too tight. Do not wear knee-high stockings since they may decrease blood flow to your legs.  Wear shoes that fit properly and have enough cushioning. To break in new shoes, wear them for just a few hours a day. This prevents you from injuring your feet. Always look in your shoes before you put them on to be sure there are no objects inside.  Do not cross your legs. This may decrease the blood flow to your feet.  If you find a minor scrape,  cut, or break in the skin on your feet, keep it and the skin around it clean and dry. These areas may be cleansed with mild soap and water. Do not cleanse the area with peroxide, alcohol, or iodine.  When you remove an adhesive bandage, be sure not to damage the skin around it.  If you have a wound, look at it several times a day to make sure it is healing.  Do not use heating pads or hot water bottles. They may burn your skin. If you have lost feeling in your feet or legs, you may not know it is happening until it is too late.  Make sure your health care provider performs a complete foot exam at least annually or more often if you have foot problems. Report any cuts, sores, or bruises to your health care provider immediately. SEEK MEDICAL CARE IF:   You have an injury that is not healing.  You have cuts or breaks in the skin.  You have an ingrown nail.  You notice redness on your legs or feet.  You feel burning or tingling in your legs or feet.  You have pain or cramps in your legs and feet.  Your legs or feet are numb.  Your feet always feel cold. SEEK IMMEDIATE MEDICAL CARE IF:   There is increasing redness,   swelling, or pain in or around a wound.  There is a red line that goes up your leg.  Pus is coming from a wound.  You develop a fever or as directed by your health care provider.  You notice a bad smell coming from an ulcer or wound. Document Released: 06/04/2000 Document Revised: 02/07/2013 Document Reviewed: 11/14/2012 Exit[employer] Patient Information 2015 [employer]. This information is not intended to replace advice given to you by your health care provider. Make sure you discuss any questions you have with your health care provider.  

## 2014-07-26 NOTE — Progress Notes (Signed)
   Subjective:    Patient ID: SIGISMUND MESKILL, male    DOB: 1930-11-25, 81 y.o.   MRN: [id number]  HPI  81 year old male presents the office today with complaints of painful, elongated toenails. She states that the nails are painful when rubbing in shoes and are painful with pressure. She is diabetic and states her last blood sugar was 96. Denies any redness or drainage from the nail sites. Denies any recent ulceration. No other complaints at this time.   Review of Systems  Genitourinary: Positive for urgency and frequency.  Musculoskeletal: Positive for back pain and gait problem.       Objective:   Physical Exam  AAO 3, NAD  DP/PT pulses decreased bilaterally, CRT less than 3 seconds  Protective sensation intact with Simms Weinstein monofilament, vibratory sensation intact, Achilles tendon reflex intact.  Nails are hypertrophic, dystrophic, elongated, brittle, discolored 10  Particular bilateral hallux. There subjective tenderness upon palpation of the nails. No strong erythema or drainage the nail sites.  No open lesions or pre-ulcerative lesions identified.  No other areas of tenderness to bilateral lower extremity is. No overlying edema, erythema, increase in warmth.  No pain with calf compression, swelling, warmth, erythema.       Assessment & Plan:   81 year old  male with symptomatic onychomycosis - Treatment options were discussed including alternatives, risks, complications. - Nail sharply debrided 10 without complication/bleeding. - Discussed the importance of daily foot inspection. - Follow-up in 3 months or sooner should any problems arise. In the meantime, encouraged to call the office with any questions, concerns, change in symptoms. Follow-up with PCP for other issues mentioned in the review of systems.

## 2014-07-29 ENCOUNTER — Encounter: Payer: Self-pay | Admitting: Podiatry

## 2014-08-07 ENCOUNTER — Telehealth: Payer: Self-pay | Admitting: Cardiology

## 2014-08-07 NOTE — Telephone Encounter (Signed)
Informed Ricardo Watkins  Information has not been obtain yet awaiting AN ANSWER. She states she was following up on question . Dental works is schedule on 08/13/14. RN wilL forward TO Dr [name] and nurse- Anderson Malta aware  PLEASE CONTACT BY 2/181/6

## 2014-08-07 NOTE — Telephone Encounter (Signed)
Ricardo Watkins needs to speak to Dr Jillyn Ledger nurse.

## 2014-08-07 NOTE — Telephone Encounter (Signed)
°  1. What dental office are you calling from? Dr[facility]  2. What is your office phone and fax number? Phone: [phone] Fax: [phone]  3. What type of procedure is the patient having performed? Possible tooth extraction  4. What date is procedure scheduled? 08/13/14  5. What is your question (ex. Antibiotics prior to procedure, holding medication-we need to know how long dentist wants pt to hold med)? Can Plavics be held for the next 5 days?

## 2014-08-08 ENCOUNTER — Telehealth: Payer: Self-pay | Admitting: Family Medicine

## 2014-08-08 ENCOUNTER — Encounter: Payer: Self-pay | Admitting: *Deleted

## 2014-08-08 NOTE — Telephone Encounter (Signed)
Ok to hold plavix for 5 days then restart after procedure per Dr. [name]

## 2014-08-08 NOTE — Telephone Encounter (Signed)
OK to hold Plavix. 

## 2014-08-08 NOTE — Telephone Encounter (Incomplete)
OK to hold Plavix. 

## 2014-08-08 NOTE — Telephone Encounter (Signed)
I spoke with Dr. [facility] office and let them know I would have an answer today, nurse stated pt. Told her he has not taken his plavix today, Noted

## 2014-08-08 NOTE — Telephone Encounter (Signed)
Letter faxed to dentist for clearance

## 2014-08-08 NOTE — Telephone Encounter (Signed)
[facility] is calling because he is needing a refill on his test strips for his meter (Accu Check)

## 2014-08-08 NOTE — Telephone Encounter (Signed)
Already done

## 2014-08-08 NOTE — Telephone Encounter (Signed)
[facility] called to pharm and refilled x 1 year

## 2014-08-17 ENCOUNTER — Other Ambulatory Visit: Payer: Self-pay | Admitting: Family Medicine

## 2014-08-23 ENCOUNTER — Ambulatory Visit (INDEPENDENT_AMBULATORY_CARE_PROVIDER_SITE_OTHER): Payer: Medicare Other | Admitting: Family Medicine

## 2014-08-23 ENCOUNTER — Encounter: Payer: Self-pay | Admitting: Family Medicine

## 2014-08-23 DIAGNOSIS — E538 Deficiency of other specified B group vitamins: Secondary | ICD-10-CM

## 2014-08-23 MED ORDER — CYANOCOBALAMIN 1000 MCG/ML IJ SOLN
1000.0000 ug | Freq: Once | INTRAMUSCULAR | Status: AC
  Administered 2014-08-23: 1000 ug via INTRAMUSCULAR

## 2014-09-24 ENCOUNTER — Ambulatory Visit (INDEPENDENT_AMBULATORY_CARE_PROVIDER_SITE_OTHER): Payer: Medicare Other | Admitting: Family Medicine

## 2014-09-24 DIAGNOSIS — E538 Deficiency of other specified B group vitamins: Secondary | ICD-10-CM

## 2014-09-24 MED ORDER — CYANOCOBALAMIN 1000 MCG/ML IJ SOLN
1000.0000 ug | Freq: Once | INTRAMUSCULAR | Status: AC
  Administered 2014-09-24: 1000 ug via INTRAMUSCULAR

## 2014-10-07 ENCOUNTER — Telehealth: Payer: Self-pay | Admitting: Family Medicine

## 2014-10-07 NOTE — Telephone Encounter (Signed)
Only a C, okay to continue.

## 2014-10-07 NOTE — Telephone Encounter (Signed)
Pharmacy called and told to continue both [id number]

## 2014-10-07 NOTE — Telephone Encounter (Signed)
Pharmacy calling due to flagged reaction possible between Actos and Plavix.  Please advise?

## 2014-10-16 DIAGNOSIS — Z8546 Personal history of malignant neoplasm of prostate: Secondary | ICD-10-CM | POA: Diagnosis not present

## 2014-10-17 ENCOUNTER — Encounter: Payer: Self-pay | Admitting: Family Medicine

## 2014-10-17 ENCOUNTER — Ambulatory Visit (INDEPENDENT_AMBULATORY_CARE_PROVIDER_SITE_OTHER): Payer: Medicare Other | Admitting: Family Medicine

## 2014-10-17 VITALS — BP 110/50 | HR 58 | Temp 97.9°F | Resp 16 | Ht 69.0 in | Wt 223.0 lb

## 2014-10-17 DIAGNOSIS — Z794 Long term (current) use of insulin: Secondary | ICD-10-CM

## 2014-10-17 DIAGNOSIS — G44309 Post-traumatic headache, unspecified, not intractable: Secondary | ICD-10-CM | POA: Diagnosis not present

## 2014-10-17 DIAGNOSIS — I10 Essential (primary) hypertension: Secondary | ICD-10-CM

## 2014-10-17 DIAGNOSIS — R42 Dizziness and giddiness: Secondary | ICD-10-CM | POA: Diagnosis not present

## 2014-10-17 DIAGNOSIS — I2510 Atherosclerotic heart disease of native coronary artery without angina pectoris: Secondary | ICD-10-CM

## 2014-10-17 DIAGNOSIS — E119 Type 2 diabetes mellitus without complications: Secondary | ICD-10-CM | POA: Diagnosis not present

## 2014-10-17 DIAGNOSIS — M545 Low back pain, unspecified: Secondary | ICD-10-CM

## 2014-10-17 DIAGNOSIS — E785 Hyperlipidemia, unspecified: Secondary | ICD-10-CM

## 2014-10-17 DIAGNOSIS — IMO0001 Reserved for inherently not codable concepts without codable children: Secondary | ICD-10-CM

## 2014-10-17 LAB — COMPLETE METABOLIC PANEL WITH GFR
ALK PHOS: 58 U/L (ref 39–117)
ALT: 14 U/L (ref 0–53)
AST: 15 U/L (ref 0–37)
Albumin: 4 g/dL (ref 3.5–5.2)
BILIRUBIN TOTAL: 0.4 mg/dL (ref 0.2–1.2)
BUN: 25 mg/dL — AB (ref 6–23)
CO2: 27 mEq/L (ref 19–32)
CREATININE: 1.6 mg/dL — AB (ref 0.50–1.35)
Calcium: 8.8 mg/dL (ref 8.4–10.5)
Chloride: 104 mEq/L (ref 96–112)
GFR, EST NON AFRICAN AMERICAN: 40 mL/min — AB
GFR, Est African American: 46 mL/min — ABNORMAL LOW
GLUCOSE: 133 mg/dL — AB (ref 70–99)
POTASSIUM: 4.2 meq/L (ref 3.5–5.3)
Sodium: 138 mEq/L (ref 135–145)
Total Protein: 6.7 g/dL (ref 6.0–8.3)

## 2014-10-17 LAB — LIPID PANEL
CHOL/HDL RATIO: 4.1 ratio
Cholesterol: 126 mg/dL (ref 0–200)
HDL: 31 mg/dL — ABNORMAL LOW (ref >=40)
LDL Cholesterol: 56 mg/dL (ref 0–99)
Triglycerides: 196 mg/dL — ABNORMAL HIGH (ref <150)
VLDL: 39 mg/dL (ref 0–40)

## 2014-10-17 MED ORDER — HYDROCODONE-ACETAMINOPHEN 5-325 MG PO TABS: 1.0000 | ORAL_TABLET | Freq: Four times a day (QID) | ORAL | Status: DC | PRN

## 2014-10-17 NOTE — Progress Notes (Signed)
Subjective:    Patient ID: Ricardo Watkins, male    DOB: 15-May-1931, 81 y.o.   MRN: [id number]  HPI  Over the last 3 weeks, the patient has been developing severe headaches on the right side of his head. He is also having dizziness with standing. He denies any syncope or presyncope however he feels extremely lightheaded whenever he stands up. He has fallen on 2 separate occasions. The second time he struck his occiput. He denies any chest pain, palpitations. He denies any vertigo. Today on examination Dix-Hallpike maneuver is negative. However the headache is starting to worsen. It is constant. He denies any blurry vision. He denies any neurologic deficits. He does have a history of a stroke. He denies any strokelike symptoms. He denies any diplopia. He denies any memory loss or personality changes. However his dizziness with standing is worsening. His blood pressure has been running low recently at home between 101 115/40-50 Past Medical History  Diagnosis Date  . PVD (peripheral vascular disease)     rt renal artery stent  . CAD (coronary artery disease)     30% LAD Stenosis, 70% ramus intermedius stenosis, treated with PTCA and angioplasty by Dr Albertine Patricia 2004  . DM (diabetes mellitus)   . Dyslipidemia   . Renal insufficiency   . Thrombocytopenia   . Hypertension   . Vitamin D deficiency   . Non Hodgkin's lymphoma   . Inguinal hernia     right  . Adenomatous colon polyp   . Cataract     right eye  . Hyperlipidemia   . Prostate cancer     seed implants no recurrence  . 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 Prostate ca 09/10/2011    Gleason 3+3 R, 3+4 L lobe May 2007 Rx Radioactive seed implants Dr [name]  . DVT (deep venous thrombosis)     secondary to surgery  . UTI (urinary tract infection)   . 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 Deficiency anemia 04/19/2014   Past Surgical History  Procedure Laterality Date  . Cystourethroscopy      ROBOTIC ARM NUCLETRON SEED IMPLANTATION OF PROSTATE  . Exploratory laparotomy      For evaluation of lymphoma  . Colonoscopy    . Appendectomy     Current Outpatient Prescriptions on File Prior to Visit  Medication Sig Dispense Refill  . acetaminophen (TYLENOL) 500 MG tablet Take 500 mg by mouth every 6 (six) hours as needed for pain.    Marland Kitchen amLODipine (NORVASC) 10 MG tablet TAKE 1 TABLET BY MOUTH EVERY DAY 90 tablet 3  . atorvastatin (LIPITOR) 10 MG tablet TAKE 1/2 TABLET BY MOUTH DAILY 30 tablet 5  . clopidogrel (PLAVIX) 75 MG tablet TAKE 1 TABLET BY MOUTH DAILY WITH BREAKFAST. 90 tablet 1  . LANTUS SOLOSTAR 100 UNIT/ML Solostar Pen INJECT 25 UNITS INTO THE SKIN AT BEDTIME. (Patient taking differently: INJECT 18 UNITS INTO THE SKIN AT BEDTIME.) 15 pen 5  . metoprolol succinate (TOPROL-XL) 100 MG 24 hr tablet TAKE 1 TABLET (100 MG TOTAL) BY MOUTH DAILY. TAKE WITH OR IMMEDIATELY FOLLOWING A MEAL. 90 tablet 3  . oxybutynin (DITROPAN-XL) 5 MG 24 hr tablet Take 10 mg by mouth at bedtime.     . pioglitazone (ACTOS)  30 MG tablet TAKE 1 TABLET EVERY DAY 90 tablet 3  . quinapril (ACCUPRIL) 40 MG tablet TAKE 1 TABLET BY MOUTH EVERY DAY 90 tablet 2  . Tamsulosin HCl (FLOMAX) 0.4 MG CAPS Take 0.4 mg by mouth daily.      . [DISCONTINUED] cloNIDine (CATAPRES) 0.1 MG tablet Take 0.1 mg by mouth 2 (two) times daily.       No current facility-administered medications on file prior to visit.   Allergies  Allergen Reactions  . Glucophage [Metformin Hydrochloride]     Chest pain  . Zetia [Ezetimibe]     weakness  . Fenofibrate Rash  . Niacin-Lovastatin Er Rash   History   Social History  . Marital Status: Married    Spouse Name: N/A  . Number of Children: N/A  . Years of Education: N/A   Occupational History  . Not on file.    Social History Main Topics  . Smoking status: Former Research scientist (life sciences)  . Smokeless tobacco: Never Used     Comment: Quit 30 years ago.  . Alcohol Use: No  . Drug Use: No  . Sexual Activity:    Partners: Female   Other Topics Concern  . Not on file   Social History Narrative     Review of Systems  All other systems reviewed and are negative.      Objective:   Physical Exam  Constitutional: He is oriented to person, place, and time. He appears well-developed and well-nourished.  HENT:  Nose: Nose normal.  Mouth/Throat: Oropharynx is clear and moist.  Neck: Neck supple.  Cardiovascular: Normal rate, regular rhythm and normal heart sounds.   No murmur heard. Pulmonary/Chest: Effort normal and breath sounds normal. No respiratory distress. He has no wheezes. He has no rales.  Abdominal: Soft. Bowel sounds are normal. He exhibits no distension. There is no tenderness. There is no rebound and no guarding.  Musculoskeletal: He exhibits no edema.  Lymphadenopathy:    He has no cervical adenopathy.  Neurological: He is alert and oriented to person, place, and time. He has normal reflexes. He displays normal reflexes. No cranial nerve deficit. He exhibits normal muscle tone. Coordination normal.  Vitals reviewed.         Assessment & Plan:  IDDM (insulin dependent diabetes mellitus) - Plan: CBC with Differential/Platelet, COMPLETE METABOLIC PANEL WITH GFR, Lipid panel, Hemoglobin A1c, Microalbumin, urine  ASCVD (arteriosclerotic cardiovascular disease)  HLD (hyperlipidemia)  Essential hypertension  Midline low back pain without sciatica - Plan: HYDROcodone-acetaminophen (NORCO) 5-325 MG per tablet  Dizziness - Plan: CT Head W Contrast  Post-traumatic headache, not intractable, unspecified chronicity pattern - Plan: CT Head W Contrast  I'm concerned that the patient may be getting dizzy and falling due to low blood pressure. Therefore I'm going to have the patient temporarily  discontinue amlodipine as well as Flomax. I will recheck him here next week to see if his symptoms are improving. However given the fact that he has now developed a daily headache along with dizziness and he is recently had his head several times after falls, I will obtain a head CT without contrast to evaluate for subdural hematoma versus other intracranial pathology. Given his chronic kidney disease, I will not be able to use contrast. However we should be able to see any sign of bleeding without contrast. Patient's blood pressure is low. I will discontinue amlodipine. While he is here with a check a hemoglobin A1c along with a urine microalbumin to monitor his insulin-dependent  diabetes mellitus. We may need to decrease his medication if his blood sugar is too tightly controlled. Given his age, and ideal hemoglobin A1c will be between 7 and 8. I will also check a fasting lipid panel. Goal LDL cholesterol is less than 70 given his history of coronary artery disease as well as stroke. Patient is also having some chronic low back pain. This back pain has worsened after his fall yesterday. There is no tenderness to palpation over the spinous processes of the lumbar spine. The pain is more muscular in nature and Tylenol is not relieving his pain. Because of his chronic kidney disease he cannot take NSAIDs. Therefore I did give the patient hydrocodone/acetaminophen 5/325 one by mouth every 6 hours when necessary pain

## 2014-10-18 ENCOUNTER — Ambulatory Visit
Admission: RE | Admit: 2014-10-18 | Discharge: 2014-10-18 | Disposition: A | Discharge status: Home / Self Care | Payer: Medicare Other | Source: Ambulatory Visit | Attending: Family Medicine | Admitting: Family Medicine

## 2014-10-18 ENCOUNTER — Other Ambulatory Visit: Payer: Self-pay | Admitting: Family Medicine

## 2014-10-18 ENCOUNTER — Encounter: Payer: Self-pay | Admitting: Hematology and Oncology

## 2014-10-18 ENCOUNTER — Other Ambulatory Visit: Payer: Medicare Other

## 2014-10-18 ENCOUNTER — Ambulatory Visit (HOSPITAL_BASED_OUTPATIENT_CLINIC_OR_DEPARTMENT_OTHER): Payer: Medicare Other | Admitting: Hematology and Oncology

## 2014-10-18 ENCOUNTER — Telehealth: Payer: Self-pay | Admitting: Hematology and Oncology

## 2014-10-18 ENCOUNTER — Other Ambulatory Visit: Payer: Self-pay | Admitting: Hematology and Oncology

## 2014-10-18 VITALS — BP 133/64 | HR 74 | Temp 97.7°F | Resp 18 | Ht 69.0 in | Wt 223.8 lb

## 2014-10-18 DIAGNOSIS — D539 Nutritional anemia, unspecified: Secondary | ICD-10-CM

## 2014-10-18 DIAGNOSIS — D696 Thrombocytopenia, unspecified: Secondary | ICD-10-CM

## 2014-10-18 DIAGNOSIS — C61 Malignant neoplasm of prostate: Secondary | ICD-10-CM | POA: Diagnosis not present

## 2014-10-18 DIAGNOSIS — R51 Headache: Secondary | ICD-10-CM | POA: Diagnosis not present

## 2014-10-18 DIAGNOSIS — N183 Chronic kidney disease, stage 3 unspecified: Secondary | ICD-10-CM

## 2014-10-18 DIAGNOSIS — R42 Dizziness and giddiness: Secondary | ICD-10-CM | POA: Diagnosis not present

## 2014-10-18 DIAGNOSIS — D509 Iron deficiency anemia, unspecified: Secondary | ICD-10-CM | POA: Diagnosis not present

## 2014-10-18 DIAGNOSIS — C8590 Non-Hodgkin lymphoma, unspecified, unspecified site: Secondary | ICD-10-CM | POA: Diagnosis not present

## 2014-10-18 DIAGNOSIS — G44309 Post-traumatic headache, unspecified, not intractable: Secondary | ICD-10-CM

## 2014-10-18 LAB — CBC WITH DIFFERENTIAL/PLATELET
Basophils Absolute: 0 10*3/uL (ref 0.0–0.1)
Basophils Relative: 0 % (ref 0–1)
Eosinophils Absolute: 0 10*3/uL (ref 0.0–0.7)
Eosinophils Relative: 1 % (ref 0–5)
HCT: 34.6 % — ABNORMAL LOW (ref 39.0–52.0)
Hemoglobin: 10.6 g/dL — ABNORMAL LOW (ref 13.0–17.0)
LYMPHS ABS: 2.1 10*3/uL (ref 0.7–4.0)
LYMPHS PCT: 66 % — AB (ref 12–46)
MCH: 23.8 pg — ABNORMAL LOW (ref 26.0–34.0)
MCHC: 30.6 g/dL (ref 30.0–36.0)
MCV: 77.6 fL — ABNORMAL LOW (ref 78.0–100.0)
MONOS PCT: 8 % (ref 3–12)
Monocytes Absolute: 0.3 10*3/uL (ref 0.1–1.0)
Neutro Abs: 0.8 10*3/uL — ABNORMAL LOW (ref 1.7–7.7)
Neutrophils Relative %: 25 % — ABNORMAL LOW (ref 43–77)
Platelets: 63 10*3/uL — ABNORMAL LOW (ref 150–400)
RBC: 4.46 MIL/uL (ref 4.22–5.81)
RDW: 28.5 % — ABNORMAL HIGH (ref 11.5–15.5)
WBC: 3.2 10*3/uL — ABNORMAL LOW (ref 4.0–10.5)

## 2014-10-18 LAB — HEMOGLOBIN A1C
Hgb A1c MFr Bld: 7.1 % — ABNORMAL HIGH (ref <5.7)
MEAN PLASMA GLUCOSE: 157 mg/dL — AB (ref <117)

## 2014-10-18 LAB — MICROALBUMIN, URINE: Microalb, Ur: 2.4 mg/dL — ABNORMAL HIGH (ref <2.0)

## 2014-10-18 NOTE — Assessment & Plan Note (Signed)
He is not symptomatic. I will order PSA screening in the future.

## 2014-10-18 NOTE — Telephone Encounter (Signed)
Gave avs & calendar for May. °

## 2014-10-18 NOTE — Assessment & Plan Note (Signed)
This is chronic, could be related to an diagnosed myelodysplastic syndrome due to prior exposure to chemotherapy. He is currently getting vitamin B-12 replacement therapy. He is not symptomatic. Recommend observation only. Due to obesity, it is difficult to appreciate hepatosplenomegaly. I will staging with PET scan first and he may still need bone marrow aspirate and biopsy to exclude myelodysplastic syndrome as a cause of progressive thrombocytopenia.

## 2014-10-18 NOTE — Assessment & Plan Note (Signed)
He has progressive microcytic anemia which could be related to anemia chronic disease or iron deficiency. At present time, he does not require any from of transfusion. I want to stage him with PET/CT scan first. At some point in time, he would need a bone marrow aspirate and biopsy for further assessment.

## 2014-10-18 NOTE — Assessment & Plan Note (Signed)
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 I am concerned about his daily night sweats, new palpable lymphadenopathy in the left axilla and progressive pancytopenia, worrisome for disease recurrence. I will proceed to order PET CT scan for staging and see him back next week for further assessment.

## 2014-10-18 NOTE — Progress Notes (Signed)
[name] OFFICE PROGRESS NOTE  Patient Care Team: [name], MD as PCP - General (Family Medicine) Heath Lark, MD as Consulting Physician (Hematology and Oncology)  SUMMARY OF ONCOLOGIC HISTORY:  Ricardo Watkins was transferred to my care after his prior physician has left.  I reviewed the patient's records extensive and collaborated the history with the patient. Summary of his history is as follows: He was initially diagnosed with follicular grade 2, B-cell, non-Hodgkin's lymphoma in August 2000 when he presented with bulky retroperitoneal and periaortic lymphadenopathy. He achieved a near complete response to 4 cycles of fludarabine, mitoxantrone, and dexamethasone. Due to significant myelosuppression, chemotherapy was stopped after 4 cycles. He was put on consolidation therapy with Rituxan weekly x4. All treatments were completed by 07/21/1999. He has had no signs of recurrent lymphoma since that time.  Over time he has developed other problems. He developed cancer of the prostate Gleason 3+3 right lobe, 3+ 4 left lobe, in May of 2007 treated with radioactive iodine seed implants.  He developed a modest fall in his platelet count 4 years ago consistent with chronic ITP. Platelets have stayed at or around 100,000.  He had a stroke in July 2012. There was an acute infarction in the genu and posterior limb of the right anterior capsule. 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  He has an elevated MCV of 105, a monocytosis up to 16%, borderline elevation of LDH up to 303, low bilirubin 0.35, normal serum immunoglobulin levels and no monoclonal proteins on immunofixation electrophoresis. He had borderline decreased B12 level 194, back in April. He was started on B12 injections without any major improvement in his degree of anemia. Iron studies normal with high normal ferritin 317. Findings above are consistent with a myelodysplastic syndrome likely  related to his remote chemotherapy treatments. Hemoglobin level has stabilized at 13 g. Platelets and white count stable. A CT scan done subsequent to his April 2014 visit did not show any evidence for recurrent lymphoma.  INTERVAL HISTORY: Please see below for problem oriented charting. Since he was last seen here, he has been complaining of dizziness for last 2 weeks and has fell 2 days ago. He was evaluated by primary care doctor who discontinue a few of his blood pressure medications. His appetite is preserved and he denies weight loss. His wife noted he started to have drenching night sweats on a regular basis.  REVIEW OF SYSTEMS:   Constitutional: Denies fevers, chills or abnormal weight loss Eyes: Denies blurriness of vision Ears, nose, mouth, throat, and face: Denies mucositis or sore throat Respiratory: Denies cough, dyspnea or wheezes Cardiovascular: Denies palpitation, chest discomfort or lower extremity swelling Gastrointestinal:  Denies nausea, heartburn or change in bowel habits Skin: Denies abnormal skin rashes Lymphatics: Denies new lymphadenopathy  Neurological:Denies numbness, tingling Behavioral/Psych: Mood is stable, no new changes  All other systems were reviewed with the patient and are negative.  I have reviewed the past medical history, past surgical history, social history and family history with the patient and they are unchanged from previous note.  ALLERGIES:  is allergic to glucophage; zetia; fenofibrate; and niacin-lovastatin er.  MEDICATIONS:  Current Outpatient Prescriptions  Medication Sig Dispense Refill  . acetaminophen (TYLENOL) 500 MG tablet Take 500 mg by mouth every 6 (six) hours as needed for pain.    Marland Kitchen atorvastatin (LIPITOR) 10 MG tablet TAKE 1/2 TABLET BY MOUTH DAILY 30 tablet 5  . clopidogrel (PLAVIX) 75 MG  tablet TAKE 1 TABLET BY MOUTH DAILY WITH BREAKFAST. 90 tablet 1  . HYDROcodone-acetaminophen (NORCO) 5-325 MG per tablet Take 1 tablet by  mouth every 6 (six) hours as needed for moderate pain. 30 tablet 0  . LANTUS SOLOSTAR 100 UNIT/ML Solostar Pen INJECT 25 UNITS INTO THE SKIN AT BEDTIME. (Patient taking differently: INJECT 16 UNITS INTO THE SKIN AT BEDTIME.) 15 pen 5  . metoprolol succinate (TOPROL-XL) 100 MG 24 hr tablet TAKE 1 TABLET (100 MG TOTAL) BY MOUTH DAILY. TAKE WITH OR IMMEDIATELY FOLLOWING A MEAL. 90 tablet 3  . oxybutynin (DITROPAN-XL) 5 MG 24 hr tablet Take 10 mg by mouth at bedtime.     . pioglitazone (ACTOS) 30 MG tablet TAKE 1 TABLET EVERY DAY 90 tablet 3  . quinapril (ACCUPRIL) 40 MG tablet TAKE 1 TABLET BY MOUTH EVERY DAY 90 tablet 2  . amLODipine (NORVASC) 10 MG tablet TAKE 1 TABLET BY MOUTH EVERY DAY (Patient not taking: Reported on 10/18/2014) 90 tablet 3  . Tamsulosin HCl (FLOMAX) 0.4 MG CAPS Take 0.4 mg by mouth daily.      . [DISCONTINUED] cloNIDine (CATAPRES) 0.1 MG tablet Take 0.1 mg by mouth 2 (two) times daily.       No current facility-administered medications for this visit.    PHYSICAL EXAMINATION: ECOG PERFORMANCE STATUS: 1 - Symptomatic but completely ambulatory  Filed Vitals:   10/18/14 1401  BP: 133/64  Pulse: 74  Temp: 97.7 F (36.5 C)  Resp: 18   Filed Weights   10/18/14 1401  Weight: 223 lb 12.8 oz (101.515 kg)    GENERAL:alert, no distress and comfortable. He is obese SKIN: Extensive skin bruising is noted. EYES: normal, Conjunctiva are pink and non-injected, sclera clear OROPHARYNX:no exudate, no erythema and lips, buccal mucosa, and tongue normal  NECK: supple, thyroid normal size, non-tender, without nodularity LYMPH:  Palpable lymphadenopathy is detected in the left axilla  LUNGS: clear to auscultation and percussion with normal breathing effort HEART: regular rate & rhythm and no murmurs and no lower extremity edema ABDOMEN:abdomen soft, non-tender and normal bowel sounds. Unable to appreciate splenomegaly due to morbid obesity Musculoskeletal:no cyanosis of digits and  no clubbing  NEURO: alert & oriented x 3 with fluent speech, no focal motor/sensory deficits  LABORATORY DATA:  I have reviewed the data as listed    Component Value Date/Time   NA 138 10/17/2014 1000   NA 142 04/19/2014 1218   K 4.2 10/17/2014 1000   K 4.1 04/19/2014 1218   CL 104 10/17/2014 1000   CL 107 09/27/2012 0940   CO2 27 10/17/2014 1000   CO2 25 04/19/2014 1218   GLUCOSE 133* 10/17/2014 1000   GLUCOSE 118 04/19/2014 1218   GLUCOSE 89 09/27/2012 0940   BUN 25* 10/17/2014 1000   BUN 26.4* 04/19/2014 1218   CREATININE 1.60* 10/17/2014 1000   CREATININE 1.9* 04/19/2014 1218   CREATININE 1.64* 09/10/2011 1204   CALCIUM 8.8 10/17/2014 1000   CALCIUM 9.1 04/19/2014 1218   PROT 6.7 10/17/2014 1000   PROT 6.6 04/19/2014 1218   ALBUMIN 4.0 10/17/2014 1000   ALBUMIN 3.5 04/19/2014 1218   AST 15 10/17/2014 1000   AST 14 04/19/2014 1218   ALT 14 10/17/2014 1000   ALT 14 04/19/2014 1218   ALKPHOS 58 10/17/2014 1000   ALKPHOS 71 04/19/2014 1218   BILITOT 0.4 10/17/2014 1000   BILITOT 0.42 04/19/2014 1218   GFRNONAA 40* 10/17/2014 1000   GFRNONAA 51* 01/19/2011 0605   GFRAA  46* 10/17/2014 1000   GFRAA >60 01/19/2011 0605    No results found for: SPEP, UPEP  Lab Results  Component Value Date   WBC 3.2* 10/17/2014   NEUTROABS 0.8* 10/17/2014   HGB 10.6* 10/17/2014   HCT 34.6* 10/17/2014   MCV 77.6* 10/17/2014   PLT 63* 10/17/2014      Chemistry      Component Value Date/Time   NA 138 10/17/2014 1000   NA 142 04/19/2014 1218   K 4.2 10/17/2014 1000   K 4.1 04/19/2014 1218   CL 104 10/17/2014 1000   CL 107 09/27/2012 0940   CO2 27 10/17/2014 1000   CO2 25 04/19/2014 1218   BUN 25* 10/17/2014 1000   BUN 26.4* 04/19/2014 1218   CREATININE 1.60* 10/17/2014 1000   CREATININE 1.9* 04/19/2014 1218   CREATININE 1.64* 09/10/2011 1204      Component Value Date/Time   CALCIUM 8.8 10/17/2014 1000   CALCIUM 9.1 04/19/2014 1218   ALKPHOS 58 10/17/2014 1000    ALKPHOS 71 04/19/2014 1218   AST 15 10/17/2014 1000   AST 14 04/19/2014 1218   ALT 14 10/17/2014 1000   ALT 14 04/19/2014 1218   BILITOT 0.4 10/17/2014 1000   BILITOT 0.42 04/19/2014 1218       RADIOGRAPHIC STUDIES: I have personally reviewed the radiological images as listed and agreed with the findings in the report. Ct Head Wo Contrast  10/18/2014   CLINICAL DATA:  Right-sided headache and dizziness. Prostate cancer and non Hodgkin lymphoma  EXAM: CT HEAD WITHOUT CONTRAST  TECHNIQUE: Contiguous axial images were obtained from the base of the skull through the vertex without intravenous contrast.  COMPARISON:  CT 01/18/2011, MRI 01/19/2011  FINDINGS: Mild atrophy.  Negative for hydrocephalus.  Chronic microvascular ischemic changes in the cerebral white matter. Chronic lacunar infarction in the right anterior medial thalamus and in the right putamen. Chronic left parietal cortical infarct.  1 cm ill-defined hypodensity right occipital lobe not seen on either prior study. This is likely due to chronic ischemia but could be acute. Correlate with any visual field defect.  Negative for hemorrhage or mass  Calvarium is intact.  IMPRESSION: Atrophy and chronic microvascular ischemia  Hypodensity right occipital lobe was not present on prior studies and could represent acute or chronic ischemia.   Electronically Signed   By: Franchot Gallo M.D.   On: 10/18/2014 14:05     ASSESSMENT & PLAN:  Non-Hodgkin lymphoma The patient have follicular lymphoma treated in the past. I am concerned about his daily night sweats, new palpable lymphadenopathy in the left axilla and progressive pancytopenia, worrisome for disease recurrence. I will proceed to order PET CT scan for staging and see him back next week for further assessment.   Deficiency anemia He has progressive microcytic anemia which could be related to anemia chronic disease or iron deficiency. At present time, he does not require any from of  transfusion. I want to stage him with PET/CT scan first. At some point in time, he would need a bone marrow aspirate and biopsy for further assessment.   Prostate ca He is not symptomatic. I will order PSA screening in the future.   Thrombocytopenia This is chronic, could be related to an diagnosed myelodysplastic syndrome due to prior exposure to chemotherapy. He is currently getting vitamin B-12 replacement therapy. He is not symptomatic. Recommend observation only. Due to obesity, it is difficult to appreciate hepatosplenomegaly. I will staging with PET scan first and he  may still need bone marrow aspirate and biopsy to exclude myelodysplastic syndrome as a cause of progressive thrombocytopenia.   Chronic renal failure, stage 3 (moderate) The patient have chronic kidney disease. His creatinine is stable. For this reason, I would not order contrast-enhanced CT scan and rather use PET CT scan to stage his lymphoma.    Orders Placed This Encounter  Procedures  . NM PET Image Initial (PI) Skull Base To Thigh    Standing Status: Future     Number of Occurrences:      Standing Expiration Date: 12/18/2015    Order Specific Question:  Reason for Exam (SYMPTOM  OR DIAGNOSIS REQUIRED)    Answer:  lymphoma recurrence, staging    Order Specific Question:  Preferred imaging location?    Answer:  [facility]   All questions were answered. The patient knows to call the clinic with any problems, questions or concerns. No barriers to learning was detected. I spent 30 minutes counseling the patient face to face. The total time spent in the appointment was 40 minutes and more than 50% was on counseling and review of test results     [facility], [place], MD 10/18/2014 2:41 PM

## 2014-10-18 NOTE — Assessment & Plan Note (Signed)
The patient have chronic kidney disease. His creatinine is stable. For this reason, I would not order contrast-enhanced CT scan and rather use PET CT scan to stage his lymphoma.

## 2014-10-22 ENCOUNTER — Telehealth: Payer: Self-pay

## 2014-10-22 NOTE — Telephone Encounter (Signed)
Wife called stating appt with Dr [name] is 5/6  but PET was not scheduled until 5/11. Does Dr [name] want to postpone his appt until after the PET?

## 2014-10-22 NOTE — Telephone Encounter (Signed)
Yes, pls cancel and reschedule appt to 3 pm, 5/12

## 2014-10-23 ENCOUNTER — Telehealth: Payer: Self-pay | Admitting: Hematology and Oncology

## 2014-10-23 ENCOUNTER — Telehealth: Payer: Self-pay | Admitting: *Deleted

## 2014-10-23 ENCOUNTER — Other Ambulatory Visit: Payer: Self-pay | Admitting: *Deleted

## 2014-10-23 DIAGNOSIS — R351 Nocturia: Secondary | ICD-10-CM | POA: Diagnosis not present

## 2014-10-23 DIAGNOSIS — N320 Bladder-neck obstruction: Secondary | ICD-10-CM | POA: Diagnosis not present

## 2014-10-23 DIAGNOSIS — Z8546 Personal history of malignant neoplasm of prostate: Secondary | ICD-10-CM | POA: Diagnosis not present

## 2014-10-23 NOTE — Telephone Encounter (Signed)
MD visit added per 05/04 POF, pt's wife has been notified..... KJ

## 2014-10-23 NOTE — Telephone Encounter (Signed)
Left VM for wife that appt w/ [name] will be r/s from 5/6 to 5/12 at 3pm,  So it will be after PET scan which is scheduled for 5/11.  Asked her to call back if any questions or problems.

## 2014-10-24 ENCOUNTER — Encounter: Payer: Self-pay | Admitting: Family Medicine

## 2014-10-24 ENCOUNTER — Ambulatory Visit (INDEPENDENT_AMBULATORY_CARE_PROVIDER_SITE_OTHER): Payer: Medicare Other | Admitting: Family Medicine

## 2014-10-24 VITALS — BP 130/60 | HR 62 | Temp 97.6°F | Resp 18 | Ht 69.0 in | Wt 218.0 lb

## 2014-10-24 DIAGNOSIS — R42 Dizziness and giddiness: Secondary | ICD-10-CM

## 2014-10-24 DIAGNOSIS — E538 Deficiency of other specified B group vitamins: Secondary | ICD-10-CM | POA: Diagnosis not present

## 2014-10-24 DIAGNOSIS — I2510 Atherosclerotic heart disease of native coronary artery without angina pectoris: Secondary | ICD-10-CM

## 2014-10-24 MED ORDER — CYANOCOBALAMIN 1000 MCG/ML IJ SOLN
1000.0000 ug | Freq: Once | INTRAMUSCULAR | Status: AC
  Administered 2014-10-24: 1000 ug via INTRAMUSCULAR

## 2014-10-24 NOTE — Progress Notes (Signed)
Subjective:    Patient ID: Ricardo Watkins, male    DOB: [birth year], 81 y.o.   MRN: [id number]  HPI  10/17/14 Over the last 3 weeks, the patient has been developing severe headaches on the right side of his head. He is also having dizziness with standing. He denies any syncope or presyncope however he feels extremely lightheaded whenever he stands up. He has fallen on 2 separate occasions. The second time he struck his occiput. He denies any chest pain, palpitations. He denies any vertigo. Today on examination Dix-Hallpike maneuver is negative. However the headache is starting to worsen. It is constant. He denies any blurry vision. He denies any neurologic deficits. He does have a history of a stroke. He denies any strokelike symptoms. He denies any diplopia. He denies any memory loss or personality changes. However his dizziness with standing is worsening. His blood pressure has been running low recently at home between 101 115/40-50.  At that time, my plan was: I'm concerned that the patient may be getting dizzy and falling due to low blood pressure. Therefore I'm going to have the patient temporarily discontinue amlodipine as well as Flomax. I will recheck him here next week to see if his symptoms are improving. However given the fact that he has now developed a daily headache along with dizziness and he is recently had his head several times after falls, I will obtain a head CT without contrast to evaluate for subdural hematoma versus other intracranial pathology. Given his chronic kidney disease, I will not be able to use contrast. However we should be able to see any sign of bleeding without contrast. Patient's blood pressure is low. I will discontinue amlodipine. While he is here with a check a hemoglobin A1c along with a urine microalbumin to monitor his insulin-dependent diabetes mellitus. We may need to decrease his medication if his blood sugar is too tightly controlled. Given his age, and ideal  hemoglobin A1c will be between 7 and 8. I will also check a fasting lipid panel. Goal LDL cholesterol is less than 70 given his history of coronary artery disease as well as stroke. Patient is also having some chronic low back pain. This back pain has worsened after his fall yesterday. There is no tenderness to palpation over the spinous processes of the lumbar spine. The pain is more muscular in nature and Tylenol is not relieving his pain. Because of his chronic kidney disease he cannot take NSAIDs. Therefore I did give the patient hydrocodone/acetaminophen 5/325 one by mouth every 6 hours when necessary pain  10/24/14 Patient states he is doing much better. He has not fallen anymore. He is no longer having dizzy spells. Patient did have to resume the Flomax as he was having urinary retention without it. However he continues to refrain from using amlodipine. Unfortunately he saw his hematologist recently. His myelodysplasia is worsening. They're concerned about a reactivation of his non-Hodgkin's lymphoma. This scheduled the patient for a PET scan Past Medical History  Diagnosis Date  . PVD (peripheral vascular disease)     rt renal artery stent  . CAD (coronary artery disease)     30% LAD Stenosis, 70% ramus intermedius stenosis, treated with PTCA and angioplasty by Dr Albertine Patricia 2004  . DM (diabetes mellitus)   . Dyslipidemia   . Renal insufficiency   . Thrombocytopenia   . Hypertension   . Vitamin D deficiency   . Non Hodgkin's lymphoma   . Inguinal hernia  right  . Adenomatous colon polyp   . Cataract     right eye  . Hyperlipidemia   . Prostate cancer     seed implants no recurrence  . 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 Prostate ca 09/10/2011    Gleason 3+3 R, 3+4 L lobe May 2007 Rx Radioactive seed implants Dr [name]  . DVT (deep venous thrombosis)     secondary  to surgery  . UTI (urinary tract infection)   . 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 Deficiency anemia 04/19/2014   Past Surgical History  Procedure Laterality Date  . Cystourethroscopy      ROBOTIC ARM NUCLETRON SEED IMPLANTATION OF PROSTATE  . Exploratory laparotomy      For evaluation of lymphoma  . Colonoscopy    . Appendectomy     Current Outpatient Prescriptions on File Prior to Visit  Medication Sig Dispense Refill  . acetaminophen (TYLENOL) 500 MG tablet Take 500 mg by mouth every 6 (six) hours as needed for pain.    Marland Kitchen amLODipine (NORVASC) 10 MG tablet TAKE 1 TABLET BY MOUTH EVERY DAY 90 tablet 3  . atorvastatin (LIPITOR) 10 MG tablet TAKE 1/2 TABLET BY MOUTH DAILY 30 tablet 5  . clopidogrel (PLAVIX) 75 MG tablet TAKE 1 TABLET BY MOUTH DAILY WITH BREAKFAST. 90 tablet 1  . HYDROcodone-acetaminophen (NORCO) 5-325 MG per tablet Take 1 tablet by mouth every 6 (six) hours as needed for moderate pain. 30 tablet 0  . LANTUS SOLOSTAR 100 UNIT/ML Solostar Pen INJECT 25 UNITS INTO THE SKIN AT BEDTIME. (Patient taking differently: INJECT 16 UNITS INTO THE SKIN AT BEDTIME.) 15 pen 5  . metoprolol succinate (TOPROL-XL) 100 MG 24 hr tablet TAKE 1 TABLET (100 MG TOTAL) BY MOUTH DAILY. TAKE WITH OR IMMEDIATELY FOLLOWING A MEAL. 90 tablet 3  . oxybutynin (DITROPAN-XL) 5 MG 24 hr tablet Take 10 mg by mouth at bedtime.     . pioglitazone (ACTOS) 30 MG tablet TAKE 1 TABLET EVERY DAY 90 tablet 3  . quinapril (ACCUPRIL) 40 MG tablet TAKE 1 TABLET BY MOUTH EVERY DAY 90 tablet 2  . Tamsulosin HCl (FLOMAX) 0.4 MG CAPS Take 0.4 mg by mouth daily.      . [DISCONTINUED] cloNIDine (CATAPRES) 0.1 MG tablet Take 0.1 mg by mouth 2 (two) times daily.       No current facility-administered medications on file prior to visit.   Allergies  Allergen Reactions  . Glucophage [Metformin Hydrochloride]     Chest pain  . Zetia  [Ezetimibe]     weakness  . Fenofibrate Rash  . Niacin-Lovastatin Er Rash   History   Social History  . Marital Status: Married    Spouse Name: N/A  . Number of Children: N/A  . Years of Education: N/A   Occupational History  . Not on file.   Social History Main Topics  . Smoking status: Former Research scientist (life sciences)  . Smokeless tobacco: Never Used     Comment: Quit 30 years ago.  . Alcohol Use: No  . Drug Use: No  . Sexual Activity:    Partners: Female   Other Topics Concern  . Not on file   Social History Narrative     Review of Systems  All other systems reviewed  and are negative.      Objective:   Physical Exam  Constitutional: He is oriented to person, place, and time. He appears well-developed and well-nourished.  HENT:  Nose: Nose normal.  Mouth/Throat: Oropharynx is clear and moist.  Neck: Neck supple.  Cardiovascular: Normal rate, regular rhythm and normal heart sounds.   No murmur heard. Pulmonary/Chest: Effort normal and breath sounds normal. No respiratory distress. He has no wheezes. He has no rales.  Abdominal: Soft. Bowel sounds are normal. He exhibits no distension. There is no tenderness. There is no rebound and no guarding.  Musculoskeletal: He exhibits no edema.  Lymphadenopathy:    He has no cervical adenopathy.  Neurological: He is alert and oriented to person, place, and time. He has normal reflexes. No cranial nerve deficit. He exhibits normal muscle tone. Coordination normal.  Vitals reviewed.         Assessment & Plan:  Dizziness  Patient's dizziness seems to be related to his blood pressure. He feels much better since discontinuing amlodipine. His head CT showed no stroke, intracranial hemorrhage, or brain tumor. Therefore no further follow-up is necessary for this condition. I will eagerly await the results of his PET scan

## 2014-10-24 NOTE — Addendum Note (Signed)
Addended by: Shary Decamp B on: 10/24/2014 04:06 PM   Modules accepted: Orders

## 2014-10-25 ENCOUNTER — Ambulatory Visit: Payer: Medicare Other | Admitting: Hematology and Oncology

## 2014-10-25 ENCOUNTER — Encounter: Payer: Self-pay | Admitting: Podiatry

## 2014-10-25 ENCOUNTER — Ambulatory Visit (INDEPENDENT_AMBULATORY_CARE_PROVIDER_SITE_OTHER): Payer: Medicare Other | Admitting: Podiatry

## 2014-10-25 DIAGNOSIS — B351 Tinea unguium: Secondary | ICD-10-CM

## 2014-10-25 DIAGNOSIS — M79676 Pain in unspecified toe(s): Secondary | ICD-10-CM

## 2014-10-26 NOTE — Progress Notes (Signed)
Patient ID: Ricardo Watkins, male   DOB: 10-04-1930, 81 y.o.   MRN: [id number]  Subjective: 81 y.o.-year-old male returns the office today for painful, elongated, thickened toenails which he is unable to trim himself. Denies any redness or drainage around the nails. Denies any acute changes since last appointment and no new complaints today. Denies any systemic complaints such as fevers, chills, nausea, vomiting.   Objective: AAO 3, NAD DP/PT pulses decreased, CRT less than 3 seconds Protective sensation decreased with Simms Weinstein monofilament, Achilles tendon reflex intact.  Nails hypertrophic, dystrophic, elongated, brittle, discolored 10. There is tenderness overlying the nails 1-5 bilaterally. There is no surrounding erythema or drainage along the nail sites. No open lesions or pre-ulcerative lesions are identified. No other areas of tenderness bilateral lower extremities. No overlying edema, erythema, increased warmth. No pain with calf compression, swelling, warmth, erythema.  Assessment: Patient presents with symptomatic onychomycosis  Plan: -Treatment options including alternatives, risks, complications were discussed -Nails sharply debrided 10 without complication/bleeding. -Discussed daily foot inspection. If there are any changes, to call the office immediately.  -Follow-up in 3 months or sooner if any problems are to arise. In the meantime, encouraged to call the office with any questions, concerns, changes symptoms.

## 2014-10-30 ENCOUNTER — Ambulatory Visit (HOSPITAL_COMMUNITY)
Admission: RE | Admit: 2014-10-30 | Discharge: 2014-10-30 | Disposition: A | Discharge status: Home / Self Care | Payer: Medicare Other | Source: Ambulatory Visit | Attending: Hematology and Oncology | Admitting: Hematology and Oncology

## 2014-10-30 DIAGNOSIS — K4090 Unilateral inguinal hernia, without obstruction or gangrene, not specified as recurrent: Secondary | ICD-10-CM | POA: Insufficient documentation

## 2014-10-30 DIAGNOSIS — J90 Pleural effusion, not elsewhere classified: Secondary | ICD-10-CM | POA: Insufficient documentation

## 2014-10-30 DIAGNOSIS — C8590 Non-Hodgkin lymphoma, unspecified, unspecified site: Secondary | ICD-10-CM | POA: Diagnosis present

## 2014-10-30 DIAGNOSIS — Z79899 Other long term (current) drug therapy: Secondary | ICD-10-CM | POA: Insufficient documentation

## 2014-10-30 DIAGNOSIS — I700 Atherosclerosis of aorta: Secondary | ICD-10-CM | POA: Insufficient documentation

## 2014-10-30 DIAGNOSIS — K5730 Diverticulosis of large intestine without perforation or abscess without bleeding: Secondary | ICD-10-CM | POA: Diagnosis not present

## 2014-10-30 DIAGNOSIS — G319 Degenerative disease of nervous system, unspecified: Secondary | ICD-10-CM | POA: Diagnosis not present

## 2014-10-30 LAB — GLUCOSE, CAPILLARY: Glucose-Capillary: 131 mg/dL — ABNORMAL HIGH (ref 70–99)

## 2014-10-30 MED ORDER — FLUDEOXYGLUCOSE F - 18 (FDG) INJECTION
10.8900 | Freq: Once | INTRAVENOUS | Status: AC | PRN
  Administered 2014-10-30: 10.89 via INTRAVENOUS

## 2014-10-31 ENCOUNTER — Telehealth: Payer: Self-pay | Admitting: Internal Medicine

## 2014-10-31 ENCOUNTER — Ambulatory Visit (HOSPITAL_BASED_OUTPATIENT_CLINIC_OR_DEPARTMENT_OTHER): Payer: Medicare Other | Admitting: Hematology and Oncology

## 2014-10-31 ENCOUNTER — Other Ambulatory Visit (HOSPITAL_BASED_OUTPATIENT_CLINIC_OR_DEPARTMENT_OTHER): Payer: Medicare Other

## 2014-10-31 ENCOUNTER — Telehealth: Payer: Self-pay | Admitting: Hematology and Oncology

## 2014-10-31 VITALS — BP 116/52 | HR 85 | Temp 97.6°F | Resp 18 | Ht 69.0 in | Wt 218.7 lb

## 2014-10-31 DIAGNOSIS — C8590 Non-Hodgkin lymphoma, unspecified, unspecified site: Secondary | ICD-10-CM | POA: Diagnosis not present

## 2014-10-31 DIAGNOSIS — D696 Thrombocytopenia, unspecified: Secondary | ICD-10-CM

## 2014-10-31 DIAGNOSIS — E538 Deficiency of other specified B group vitamins: Secondary | ICD-10-CM

## 2014-10-31 DIAGNOSIS — D649 Anemia, unspecified: Secondary | ICD-10-CM

## 2014-10-31 DIAGNOSIS — D539 Nutritional anemia, unspecified: Secondary | ICD-10-CM | POA: Diagnosis not present

## 2014-10-31 LAB — CBC & DIFF AND RETIC
BASO%: 0.4 % (ref 0.0–2.0)
BASOS ABS: 0 10*3/uL (ref 0.0–0.1)
EOS ABS: 0 10*3/uL (ref 0.0–0.5)
EOS%: 0.7 % (ref 0.0–7.0)
HCT: 33.7 % — ABNORMAL LOW (ref 38.4–49.9)
HGB: 10.2 g/dL — ABNORMAL LOW (ref 13.0–17.1)
IMMATURE RETIC FRACT: 18.8 % — AB (ref 3.00–10.60)
LYMPH#: 1.4 10*3/uL (ref 0.9–3.3)
LYMPH%: 51.4 % — ABNORMAL HIGH (ref 14.0–49.0)
MCH: 23.9 pg — ABNORMAL LOW (ref 27.2–33.4)
MCHC: 30.3 g/dL — AB (ref 32.0–36.0)
MCV: 79.1 fL — ABNORMAL LOW (ref 79.3–98.0)
MONO#: 0.3 10*3/uL (ref 0.1–0.9)
MONO%: 11.6 % (ref 0.0–14.0)
NEUT#: 1 10*3/uL — ABNORMAL LOW (ref 1.5–6.5)
NEUT%: 35.9 % — AB (ref 39.0–75.0)
PLATELETS: 60 10*3/uL — AB (ref 140–400)
RBC: 4.26 10*6/uL (ref 4.20–5.82)
RDW: 29.6 % — ABNORMAL HIGH (ref 11.0–14.6)
RETIC CT ABS: 71.99 10*3/uL (ref 34.80–93.90)
Retic %: 1.69 % (ref 0.80–1.80)
WBC: 2.8 10*3/uL — ABNORMAL LOW (ref 4.0–10.3)

## 2014-10-31 LAB — TECHNOLOGIST REVIEW

## 2014-10-31 NOTE — Telephone Encounter (Addendum)
Pt confirmed labs/ov per 05/12 POF, gave pt AVS and Calendar.... Cherylann Banas, s/w Christine at GI w/Dr. Perry's office, he doesn't have anything until July, she is going to have triage call me to see about scheduling earlier.Marland KitchenMarland KitchenMarland Kitchen

## 2014-11-01 ENCOUNTER — Telehealth: Payer: Self-pay | Admitting: *Deleted

## 2014-11-01 ENCOUNTER — Other Ambulatory Visit: Payer: Self-pay | Admitting: *Deleted

## 2014-11-01 ENCOUNTER — Other Ambulatory Visit: Payer: Self-pay | Admitting: Hematology and Oncology

## 2014-11-01 ENCOUNTER — Encounter: Payer: Self-pay | Admitting: Internal Medicine

## 2014-11-01 ENCOUNTER — Telehealth: Payer: Self-pay | Admitting: Hematology and Oncology

## 2014-11-01 LAB — IRON AND TIBC CHCC
%SAT: 34 % (ref 20–55)
Iron: 93 ug/dL (ref 42–163)
TIBC: 278 ug/dL (ref 202–409)
UIBC: 184 ug/dL (ref 117–376)

## 2014-11-01 LAB — FERRITIN CHCC: Ferritin: 305 ng/ml (ref 22–316)

## 2014-11-01 LAB — VITAMIN B12: VITAMIN B 12: 1083 pg/mL — AB (ref 211–911)

## 2014-11-01 LAB — SEDIMENTATION RATE: Sed Rate: 1 mm/hr (ref 0–20)

## 2014-11-01 NOTE — Telephone Encounter (Signed)
Pt scheduled to see Amy Esterwood PA 11/04/14@3pm . Maudie Mercury to notify pt of appt date and time.

## 2014-11-01 NOTE — Telephone Encounter (Signed)
I spoke with the patient over the telephone. Surprisingly, iron studies came back normal. The persistent pancytopenia is slightly worse compared to last month. I recommend that we proceed to order bone marrow aspirate and biopsy for next week.

## 2014-11-01 NOTE — Telephone Encounter (Signed)
Per staff message and POF I have scheduled appts. Advised scheduler of appts. JMW  

## 2014-11-02 NOTE — Assessment & Plan Note (Signed)
At first glance, the microcytic anemia was thought to be due to iron deficiency anemia. I recommended GI consult However, iron studies appeared adequate I recommend bone marrow aspirate and biopsy for further evaluation He is not symptomatic from anemia and I recommend observation

## 2014-11-02 NOTE — Progress Notes (Signed)
[facility] OFFICE PROGRESS NOTE  Patient Care Team: [name], MD as PCP - General (Family Medicine) Heath Lark, MD as Consulting Physician (Hematology and Oncology)  SUMMARY OF ONCOLOGIC HISTORY:  Ricardo Watkins was transferred to my care after his prior physician has left.  I reviewed the patient's records extensive and collaborated the history with the patient. Summary of his history is as follows: He was initially diagnosed with follicular grade 2, B-cell, non-Hodgkin's lymphoma in August 2000 when he presented with bulky retroperitoneal and periaortic lymphadenopathy. He achieved a near complete response to 4 cycles of fludarabine, mitoxantrone, and dexamethasone. Due to significant myelosuppression, chemotherapy was stopped after 4 cycles. He was put on consolidation therapy with Rituxan weekly x4. All treatments were completed by 07/21/1999. He has had no signs of recurrent lymphoma since that time.  Over time he has developed other problems. He developed cancer of the prostate Gleason 3+3 right lobe, 3+ 4 left lobe, in May of 2007 treated with radioactive iodine seed implants.  He developed a modest fall in his platelet count 4 years ago consistent with chronic ITP. Platelets have stayed at or around 100,000.  He had a stroke in July 2012. There was an acute infarction in the genu and posterior limb of the right anterior capsule. 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  He has an elevated MCV of 105, a monocytosis up to 16%, borderline elevation of LDH up to 303, low bilirubin 0.35, normal serum immunoglobulin levels and no monoclonal proteins on immunofixation electrophoresis. He had borderline decreased B12 level 194, back in April. He was started on B12 injections without any major improvement in his degree of anemia. Iron studies normal with high normal ferritin 317. Findings above are consistent with a myelodysplastic syndrome likely  related to his remote chemotherapy treatments. Hemoglobin level has stabilized at 13 g. Platelets and white count stable. A CT scan done subsequent to his April 2014 visit did not show any evidence for recurrent lymphoma. PET scan in May 2016 showed no signs of lymphoma  INTERVAL HISTORY: Please see below for problem oriented charting. Since he was last seen here, he has been complaining of dizziness. His appetite is preserved and he denies weight loss. His wife noted he started to have drenching night sweats on a regular basis.  REVIEW OF SYSTEMS:   Constitutional: Denies fevers, chills or abnormal weight loss Eyes: Denies blurriness of vision Ears, nose, mouth, throat, and face: Denies mucositis or sore throat Respiratory: Denies cough, dyspnea or wheezes Cardiovascular: Denies palpitation, chest discomfort or lower extremity swelling Gastrointestinal:  Denies nausea, heartburn or change in bowel habits Skin: Denies abnormal skin rashes Lymphatics: Denies new lymphadenopathy or easy bruising Neurological:Denies numbness, tingling or new weaknesses Behavioral/Psych: Mood is stable, no new changes  All other systems were reviewed with the patient and are negative.  I have reviewed the past medical history, past surgical history, social history and family history with the patient and they are unchanged from previous note.  ALLERGIES:  is allergic to glucophage; zetia; fenofibrate; and niacin-lovastatin er.  MEDICATIONS:  Current Outpatient Prescriptions  Medication Sig Dispense Refill  . acetaminophen (TYLENOL) 500 MG tablet Take 500 mg by mouth every 6 (six) hours as needed for pain.    Marland Kitchen amLODipine (NORVASC) 10 MG tablet TAKE 1 TABLET BY MOUTH EVERY DAY 90 tablet 3  . atorvastatin (LIPITOR) 10 MG tablet TAKE 1/2 TABLET BY MOUTH DAILY 30 tablet 5  .  clopidogrel (PLAVIX) 75 MG tablet TAKE 1 TABLET BY MOUTH DAILY WITH BREAKFAST. 90 tablet 1  . HYDROcodone-acetaminophen (NORCO) 5-325 MG  per tablet Take 1 tablet by mouth every 6 (six) hours as needed for moderate pain. 30 tablet 0  . LANTUS SOLOSTAR 100 UNIT/ML Solostar Pen INJECT 25 UNITS INTO THE SKIN AT BEDTIME. (Patient taking differently: INJECT 16 UNITS INTO THE SKIN AT BEDTIME.) 15 pen 5  . metoprolol succinate (TOPROL-XL) 100 MG 24 hr tablet TAKE 1 TABLET (100 MG TOTAL) BY MOUTH DAILY. TAKE WITH OR IMMEDIATELY FOLLOWING A MEAL. 90 tablet 3  . oxybutynin (DITROPAN) 5 MG tablet   10  . oxybutynin (DITROPAN-XL) 5 MG 24 hr tablet Take 10 mg by mouth at bedtime.     . pioglitazone (ACTOS) 30 MG tablet TAKE 1 TABLET EVERY DAY 90 tablet 3  . quinapril (ACCUPRIL) 40 MG tablet TAKE 1 TABLET BY MOUTH EVERY DAY 90 tablet 2  . Tamsulosin HCl (FLOMAX) 0.4 MG CAPS Take 0.4 mg by mouth daily.      . [DISCONTINUED] cloNIDine (CATAPRES) 0.1 MG tablet Take 0.1 mg by mouth 2 (two) times daily.       No current facility-administered medications for this visit.    PHYSICAL EXAMINATION: ECOG PERFORMANCE STATUS: 1 - Symptomatic but completely ambulatory  Filed Vitals:   10/31/14 1458  BP: 116/52  Pulse: 85  Temp: 97.6 F (36.4 C)  Resp: 18   Filed Weights   10/31/14 1458  Weight: 218 lb 11.2 oz (99.202 kg)    GENERAL:alert, no distress and comfortable SKIN: skin color, texture, turgor are normal, no rashes or significant lesions EYES: normal, Conjunctiva are pink and non-injected, sclera clear Musculoskeletal:no cyanosis of digits and no clubbing  NEURO: alert & oriented x 3 with fluent speech, no focal motor/sensory deficits  LABORATORY DATA:  I have reviewed the data as listed    Component Value Date/Time   NA 138 10/17/2014 1000   NA 142 04/19/2014 1218   K 4.2 10/17/2014 1000   K 4.1 04/19/2014 1218   CL 104 10/17/2014 1000   CL 107 09/27/2012 0940   CO2 27 10/17/2014 1000   CO2 25 04/19/2014 1218   GLUCOSE 133* 10/17/2014 1000   GLUCOSE 118 04/19/2014 1218   GLUCOSE 89 09/27/2012 0940   BUN 25* 10/17/2014 1000    BUN 26.4* 04/19/2014 1218   CREATININE 1.60* 10/17/2014 1000   CREATININE 1.9* 04/19/2014 1218   CREATININE 1.64* 09/10/2011 1204   CALCIUM 8.8 10/17/2014 1000   CALCIUM 9.1 04/19/2014 1218   PROT 6.7 10/17/2014 1000   PROT 6.6 04/19/2014 1218   ALBUMIN 4.0 10/17/2014 1000   ALBUMIN 3.5 04/19/2014 1218   AST 15 10/17/2014 1000   AST 14 04/19/2014 1218   ALT 14 10/17/2014 1000   ALT 14 04/19/2014 1218   ALKPHOS 58 10/17/2014 1000   ALKPHOS 71 04/19/2014 1218   BILITOT 0.4 10/17/2014 1000   BILITOT 0.42 04/19/2014 1218   GFRNONAA 40* 10/17/2014 1000   GFRNONAA 51* 01/19/2011 0605   GFRAA 46* 10/17/2014 1000   GFRAA >60 01/19/2011 0605    No results found for: SPEP, UPEP  Lab Results  Component Value Date   WBC 2.8* 10/31/2014   NEUTROABS 1.0* 10/31/2014   HGB 10.2* 10/31/2014   HCT 33.7* 10/31/2014   MCV 79.1* 10/31/2014   PLT 60* 10/31/2014      Chemistry      Component Value Date/Time   NA 138 10/17/2014 1000  NA 142 04/19/2014 1218   K 4.2 10/17/2014 1000   K 4.1 04/19/2014 1218   CL 104 10/17/2014 1000   CL 107 09/27/2012 0940   CO2 27 10/17/2014 1000   CO2 25 04/19/2014 1218   BUN 25* 10/17/2014 1000   BUN 26.4* 04/19/2014 1218   CREATININE 1.60* 10/17/2014 1000   CREATININE 1.9* 04/19/2014 1218   CREATININE 1.64* 09/10/2011 1204      Component Value Date/Time   CALCIUM 8.8 10/17/2014 1000   CALCIUM 9.1 04/19/2014 1218   ALKPHOS 58 10/17/2014 1000   ALKPHOS 71 04/19/2014 1218   AST 15 10/17/2014 1000   AST 14 04/19/2014 1218   ALT 14 10/17/2014 1000   ALT 14 04/19/2014 1218   BILITOT 0.4 10/17/2014 1000   BILITOT 0.42 04/19/2014 1218      ASSESSMENT & PLAN:  Non-Hodgkin lymphoma PET CT scan showed no evidence of active lymphoma However, I cannot explain the cause of his progressive pancytopenia I recommend bone marrow aspirate and biopsy   Deficiency anemia At first glance, the microcytic anemia was thought to be due to iron deficiency  anemia. I recommended GI consult However, iron studies appeared adequate I recommend bone marrow aspirate and biopsy for further evaluation He is not symptomatic from anemia and I recommend observation   Thrombocytopenia This is chronic, could be related to an diagnosed myelodysplastic syndrome due to prior exposure to chemotherapy. He is currently getting vitamin B-12 replacement therapy. He is not symptomatic. Recommend observation only. PET scan did not show evidence of splenomegaly I recommend proceeding with bone marrow aspirate and biopsy    Orders Placed This Encounter  Procedures  . CBC & Diff and Retic    Standing Status: Future     Number of Occurrences: 1     Standing Expiration Date: 12/05/2015  . Vitamin B12    Standing Status: Future     Number of Occurrences: 1     Standing Expiration Date: 12/05/2015  . Sedimentation rate    Standing Status: Future     Number of Occurrences: 1     Standing Expiration Date: 12/05/2015  . Ferritin    Standing Status: Future     Number of Occurrences: 1     Standing Expiration Date: 12/05/2015  . Iron and TIBC    Standing Status: Future     Number of Occurrences: 1     Standing Expiration Date: 12/05/2015  . Ambulatory referral to Gastroenterology    Referral Priority:  Routine    Referral Type:  Consultation    Referral Reason:  Specialty Services Required    Referred to Provider:  Irene Shipper, MD    Requested Specialty:  Gastroenterology    Number of Visits Requested:  1   All questions were answered. The patient knows to call the clinic with any problems, questions or concerns. No barriers to learning was detected. I spent 25 minutes counseling the patient face to face. The total time spent in the appointment was 30 minutes and more than 50% was on counseling and review of test results     [facility], [place], MD 11/02/2014 10:13 AM

## 2014-11-02 NOTE — Assessment & Plan Note (Signed)
This is chronic, could be related to an diagnosed myelodysplastic syndrome due to prior exposure to chemotherapy. He is currently getting vitamin B-12 replacement therapy. He is not symptomatic. Recommend observation only. PET scan did not show evidence of splenomegaly I recommend proceeding with bone marrow aspirate and biopsy

## 2014-11-02 NOTE — Assessment & Plan Note (Signed)
PET CT scan showed no evidence of active lymphoma However, I cannot explain the cause of his progressive pancytopenia I recommend bone marrow aspirate and biopsy

## 2014-11-04 ENCOUNTER — Other Ambulatory Visit: Payer: Medicare Other

## 2014-11-04 ENCOUNTER — Ambulatory Visit (INDEPENDENT_AMBULATORY_CARE_PROVIDER_SITE_OTHER): Payer: Medicare Other | Admitting: Physician Assistant

## 2014-11-04 ENCOUNTER — Telehealth: Payer: Self-pay | Admitting: Hematology and Oncology

## 2014-11-04 ENCOUNTER — Encounter: Payer: Self-pay | Admitting: Physician Assistant

## 2014-11-04 VITALS — BP 104/60 | HR 80 | Ht 66.5 in | Wt 219.5 lb

## 2014-11-04 DIAGNOSIS — D649 Anemia, unspecified: Secondary | ICD-10-CM | POA: Diagnosis not present

## 2014-11-04 DIAGNOSIS — D61818 Other pancytopenia: Secondary | ICD-10-CM | POA: Diagnosis not present

## 2014-11-04 NOTE — Patient Instructions (Signed)
Please go to the basement level to our lab for a stool test. You will be given a kit and directions to take home.

## 2014-11-04 NOTE — Progress Notes (Signed)
Agree with initial assessment and plans as outlined 

## 2014-11-04 NOTE — Progress Notes (Signed)
Patient ID: Ricardo Watkins, male   DOB: December 05, 1930, 81 y.o.   MRN: [id number]   Subjective:    Patient ID: Ricardo Watkins, male    DOB: 11/04/1930, 81 y.o.   MRN: [id number]  HPI Ricardo Watkins is a pleasant 81 year old white male known remotely to Dr. Henrene Pastor from colonoscopy, who is referred by Dr. Gorsuch/oncology for evaluation of anemia. Patient has history of a non-Hodgkin's lymphoma about 15 years ago which had been treated. Rarely he has had recurrence of night sweats and lymphadenopathy in addition to pancytopenia and there has been concern for possible recurrence. He had a PET scan done on 10/30/2014 which showed a new pleural effusion but no recurrent metabolically active lymphoma. There was also no evidence of metastatic prostate cancer. Patient has history also of prostate cancer, peripheral vascular disease, chronic renal insufficiency, B12 deficiency, insulin-dependent diabetes mellitus, coronary artery disease status post stents, for which he is on Plavix, and hypertension. Review of his labs shows labs from last week with WBC 2.8 hemoglobin 10.2 hematocrit of 33.7 MCV of 79 and platelets of 60,000, ferritin 305 serum iron 93 TIBC of 278 and iron saturation of 34. Patient has no current GI symptoms, specifically denies any dysphagia or odynophagia upper abdominal pain nausea vomiting. His appetite is been good and his weight has been stable, he has had no changes in habits bowel habits, melena ,or hematochezia. He has not done stool cards He is scheduled for a bone marrow biopsy later this week.  Review of Systems Pertinent positive and negative review of systems were noted in the above HPI section.  All other review of systems was otherwise negative.  Outpatient Encounter Prescriptions as of 11/04/2014  Medication Sig  . acetaminophen (TYLENOL) 500 MG tablet Take 500 mg by mouth every 6 (six) hours as needed for pain.  Marland Kitchen atorvastatin (LIPITOR) 10 MG tablet TAKE 1/2 TABLET BY MOUTH DAILY  .  clopidogrel (PLAVIX) 75 MG tablet TAKE 1 TABLET BY MOUTH DAILY WITH BREAKFAST.  Marland Kitchen HYDROcodone-acetaminophen (NORCO) 5-325 MG per tablet Take 1 tablet by mouth every 6 (six) hours as needed for moderate pain.  Marland Kitchen LANTUS SOLOSTAR 100 UNIT/ML Solostar Pen INJECT 25 UNITS INTO THE SKIN AT BEDTIME. (Patient taking differently: INJECT 16 UNITS INTO THE SKIN AT BEDTIME.)  . metoprolol succinate (TOPROL-XL) 100 MG 24 hr tablet TAKE 1 TABLET (100 MG TOTAL) BY MOUTH DAILY. TAKE WITH OR IMMEDIATELY FOLLOWING A MEAL.  Marland Kitchen oxybutynin (DITROPAN) 5 MG tablet   . pioglitazone (ACTOS) 30 MG tablet TAKE 1 TABLET EVERY DAY  . quinapril (ACCUPRIL) 40 MG tablet TAKE 1 TABLET BY MOUTH EVERY DAY  . Tamsulosin HCl (FLOMAX) 0.4 MG CAPS Take 0.4 mg by mouth daily.    . [DISCONTINUED] amLODipine (NORVASC) 10 MG tablet TAKE 1 TABLET BY MOUTH EVERY DAY  . [DISCONTINUED] oxybutynin (DITROPAN-XL) 5 MG 24 hr tablet Take 10 mg by mouth at bedtime.    No facility-administered encounter medications on file as of 11/04/2014.   Allergies  Allergen Reactions  . Glucophage [Metformin Hydrochloride]     Chest pain  . Zetia [Ezetimibe]     weakness  . Fenofibrate Rash  . Niacin-Lovastatin Er Rash   Patient Active Problem List   Diagnosis Date Noted  . Deficiency anemia 04/19/2014  . Thrombocytopenia 04/19/2014  . Vitamin B12 deficiency 04/19/2014  . Chronic renal failure, stage 3 (moderate) 04/19/2014  . Macrocytic anemia 03/20/2013  . Monocytosis 03/20/2013  . Thrombotic stroke 09/10/2011  . Prostate ca  09/10/2011  . Inguinal hernia   . Colon polyps   . CAD (coronary artery disease) 08/26/2010  . HTN (hypertension) 08/26/2010  . Murmur 08/26/2010  . MURMUR 08/26/2010  . Non-Hodgkin lymphoma 08/25/2010  . DM 08/25/2010  . DYSLIPIDEMIA 08/25/2010  . THROMBOCYTOPENIA 08/25/2010  . HYPERTENSION 08/25/2010  . Coronary atherosclerosis 08/25/2010  . PVD 08/25/2010  . RENAL INSUFFICIENCY 08/25/2010   History   Social  History  . Marital Status: Married    Spouse Name: N/A  . Number of Children: 4  . Years of Education: N/A   Occupational History  . retired    Social History Main Topics  . Smoking status: Former Smoker    Types: Cigarettes    Quit date: 06/21/1958  . Smokeless tobacco: Never Used     Comment: Quit 30 years ago. and use to smoke pipe but quit  . Alcohol Use: No  . Drug Use: No  . Sexual Activity:    Partners: Female   Other Topics Concern  . Not on file   Social History Narrative    Ricardo Watkins's family history includes Breast cancer in his sister; Diabetes in his brother and sister; Heart disease in his brother; Lymphoma in his sister; Prostate cancer in his brother.      Objective:    Filed Vitals:   11/04/14 1454  BP: 104/60  Pulse: 80    Physical Exam  well-developed elderly white male in no acute distress, ambulates with a cane and accompanied by his wife and daughter. Blood pressure 104/60 pulse 80 height 5 foot 6 weight 219. HEENT; nontraumatic normocephalic EOMI PERRLA sclera anicteric, Neck; supple no JVD, Cardiovascular; regular rate and rhythm with S1-S2 no murmur or gallop, Pulmonary; clear bilaterally, Abdomen ;soft nontender nondistended bowel sounds are active there is no palpable mass or hepatosplenomegaly, Rectal; exam not done, Extremities; trace edema bilaterally in the ankles, Neuropsych ;mood and affect appropriate       Assessment & Plan:   #1 81 yo male with progressive pancytopenia , no iron deficiency Doubt GI source. #2 Hx of nonHodgkins Lymphoma- PET scan shows no evidence of recurrence #3 fatigue #4 New right pleural effusion #5 CAD- s/p stents #6 chronic antiplatelet therapy- on plavix  #7 hx of prostate CA #8AODM #9HTN #10- Hx of B12 deficiency  Plan;  Pt will complete Hemosure Proceed with Bone marrow bx- if diagnosis remains unclear can consider endoscopic workup, though suspect a myelodysplastic process   Addendum-  Colonoscopy from 2005- diverticulosis, and one diminitive polyp- bx showed Hyperplastic polyp.  Ricardo Bhakta Genia Harold PA-C 11/04/2014   Cc: [name], MD

## 2014-11-04 NOTE — Telephone Encounter (Signed)
Confirmed appointment for 05/19

## 2014-11-05 ENCOUNTER — Other Ambulatory Visit: Payer: Self-pay

## 2014-11-05 ENCOUNTER — Encounter: Payer: Self-pay | Admitting: Internal Medicine

## 2014-11-07 ENCOUNTER — Ambulatory Visit (HOSPITAL_BASED_OUTPATIENT_CLINIC_OR_DEPARTMENT_OTHER): Payer: Medicare Other | Admitting: Hematology and Oncology

## 2014-11-07 ENCOUNTER — Other Ambulatory Visit (HOSPITAL_COMMUNITY)
Admission: RE | Admit: 2014-11-07 | Discharge: 2014-11-07 | Disposition: A | Discharge status: Home / Self Care | Payer: Medicare Other | Source: Ambulatory Visit | Attending: Hematology and Oncology | Admitting: Hematology and Oncology

## 2014-11-07 ENCOUNTER — Encounter: Payer: Self-pay | Admitting: Hematology and Oncology

## 2014-11-07 ENCOUNTER — Other Ambulatory Visit: Payer: Medicare Other

## 2014-11-07 VITALS — BP 139/59 | HR 70 | Temp 97.6°F | Resp 20

## 2014-11-07 DIAGNOSIS — D472 Monoclonal gammopathy: Secondary | ICD-10-CM | POA: Insufficient documentation

## 2014-11-07 DIAGNOSIS — D72821 Monocytosis (symptomatic): Secondary | ICD-10-CM | POA: Diagnosis not present

## 2014-11-07 DIAGNOSIS — C8590 Non-Hodgkin lymphoma, unspecified, unspecified site: Secondary | ICD-10-CM

## 2014-11-07 DIAGNOSIS — D539 Nutritional anemia, unspecified: Secondary | ICD-10-CM | POA: Diagnosis not present

## 2014-11-07 DIAGNOSIS — D509 Iron deficiency anemia, unspecified: Secondary | ICD-10-CM | POA: Diagnosis not present

## 2014-11-07 DIAGNOSIS — D61818 Other pancytopenia: Secondary | ICD-10-CM | POA: Diagnosis not present

## 2014-11-07 DIAGNOSIS — Z8546 Personal history of malignant neoplasm of prostate: Secondary | ICD-10-CM | POA: Diagnosis not present

## 2014-11-07 LAB — BONE MARROW EXAM: Bone Marrow Exam: 350

## 2014-11-07 NOTE — Progress Notes (Signed)
0850 bmbx site dressing CDI. Pt sent to lab

## 2014-11-07 NOTE — Assessment & Plan Note (Signed)
PET CT scan showed no evidence of active lymphoma However, I cannot explain the cause of his progressive pancytopenia I recommend bone marrow aspirate and biopsy

## 2014-11-07 NOTE — Patient Instructions (Signed)
Preprinted aftercare instructions given to wife

## 2014-11-07 NOTE — Progress Notes (Signed)
[place] OFFICE PROGRESS NOTE  Patient Care Team: [name], MD as PCP - General (Family Medicine) Heath Lark, MD as Consulting Physician (Hematology and Oncology)  SUMMARY OF ONCOLOGIC HISTORY:  Ricardo Watkins was transferred to my care after his prior physician has left.  I reviewed the patient's records extensive and collaborated the history with the patient. Summary of his history is as follows: He was initially diagnosed with follicular grade 2, B-cell, non-Hodgkin's lymphoma in August 2000 when he presented with bulky retroperitoneal and periaortic lymphadenopathy. He achieved a near complete response to 4 cycles of fludarabine, mitoxantrone, and dexamethasone. Due to significant myelosuppression, chemotherapy was stopped after 4 cycles. He was put on consolidation therapy with Rituxan weekly x4. All treatments were completed by 07/21/1999. He has had no signs of recurrent lymphoma since that time.  Over time he has developed other problems. He developed cancer of the prostate Gleason 3+3 right lobe, 3+ 4 left lobe, in May of 2007 treated with radioactive iodine seed implants.  He developed a modest fall in his platelet count 4 years ago consistent with chronic ITP. Platelets have stayed at or around 100,000.  He had a stroke in July 2012. There was an acute infarction in the genu and posterior limb of the right anterior capsule. 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  He has an elevated MCV of 105, a monocytosis up to 16%, borderline elevation of LDH up to 303, low bilirubin 0.35, normal serum immunoglobulin levels and no monoclonal proteins on immunofixation electrophoresis. He had borderline decreased B12 level 194, back in April. He was started on B12 injections without any major improvement in his degree of anemia. Iron studies normal with high normal ferritin 317. Findings above are consistent with a myelodysplastic syndrome likely  related to his remote chemotherapy treatments. Hemoglobin level has stabilized at 13 g. Platelets and white count stable. A CT scan done subsequent to his April 2014 visit did not show any evidence for recurrent lymphoma. PET scan in May 2016 showed no signs of lymphoma   INTERVAL HISTORY: Please see below for problem oriented charting. He feels well. Denies recent infection. The patient denies any recent signs or symptoms of bleeding such as spontaneous epistaxis, hematuria or hematochezia.   REVIEW OF SYSTEMS:   Constitutional: Denies fevers, chills or abnormal weight loss Eyes: Denies blurriness of vision Ears, nose, mouth, throat, and face: Denies mucositis or sore throat Respiratory: Denies cough, dyspnea or wheezes Cardiovascular: Denies palpitation, chest discomfort or lower extremity swelling Gastrointestinal:  Denies nausea, heartburn or change in bowel habits Skin: Denies abnormal skin rashes Lymphatics: Denies new lymphadenopathy or easy bruising Neurological:Denies numbness, tingling or new weaknesses Behavioral/Psych: Mood is stable, no new changes  All other systems were reviewed with the patient and are negative.  I have reviewed the past medical history, past surgical history, social history and family history with the patient and they are unchanged from previous note.  ALLERGIES:  is allergic to glucophage; zetia; fenofibrate; and niacin-lovastatin er.  MEDICATIONS:  Current Outpatient Prescriptions  Medication Sig Dispense Refill  . acetaminophen (TYLENOL) 500 MG tablet Take 500 mg by mouth every 6 (six) hours as needed for pain.    Marland Kitchen atorvastatin (LIPITOR) 10 MG tablet TAKE 1/2 TABLET BY MOUTH DAILY 30 tablet 5  . clopidogrel (PLAVIX) 75 MG tablet TAKE 1 TABLET BY MOUTH DAILY WITH BREAKFAST. 90 tablet 1  . HYDROcodone-acetaminophen (NORCO) 5-325 MG per tablet Take  1 tablet by mouth every 6 (six) hours as needed for moderate pain. 30 tablet 0  . LANTUS SOLOSTAR  100 UNIT/ML Solostar Pen INJECT 25 UNITS INTO THE SKIN AT BEDTIME. (Patient taking differently: INJECT 16 UNITS INTO THE SKIN AT BEDTIME.) 15 pen 5  . metoprolol succinate (TOPROL-XL) 100 MG 24 hr tablet TAKE 1 TABLET (100 MG TOTAL) BY MOUTH DAILY. TAKE WITH OR IMMEDIATELY FOLLOWING A MEAL. 90 tablet 3  . oxybutynin (DITROPAN) 5 MG tablet   10  . pioglitazone (ACTOS) 30 MG tablet TAKE 1 TABLET EVERY DAY 90 tablet 3  . quinapril (ACCUPRIL) 40 MG tablet TAKE 1 TABLET BY MOUTH EVERY DAY 90 tablet 2  . Tamsulosin HCl (FLOMAX) 0.4 MG CAPS Take 0.4 mg by mouth daily.      . [DISCONTINUED] cloNIDine (CATAPRES) 0.1 MG tablet Take 0.1 mg by mouth 2 (two) times daily.       No current facility-administered medications for this visit.    PHYSICAL EXAMINATION: ECOG PERFORMANCE STATUS: 0 - Asymptomatic  Filed Vitals:   11/07/14 0846  BP: 139/59  Pulse:   Temp:   Resp:    There were no vitals filed for this visit.  GENERAL:alert, no distress and comfortable SKIN: skin color, texture, turgor are normal, no rashes or significant lesions EYES: normal, Conjunctiva are pink and non-injected, sclera clear Musculoskeletal:no cyanosis of digits and no clubbing  NEURO: alert & oriented x 3 with fluent speech, no focal motor/sensory deficits  LABORATORY DATA:  I have reviewed the data as listed    Component Value Date/Time   NA 138 10/17/2014 1000   NA 142 04/19/2014 1218   K 4.2 10/17/2014 1000   K 4.1 04/19/2014 1218   CL 104 10/17/2014 1000   CL 107 09/27/2012 0940   CO2 27 10/17/2014 1000   CO2 25 04/19/2014 1218   GLUCOSE 133* 10/17/2014 1000   GLUCOSE 118 04/19/2014 1218   GLUCOSE 89 09/27/2012 0940   BUN 25* 10/17/2014 1000   BUN 26.4* 04/19/2014 1218   CREATININE 1.60* 10/17/2014 1000   CREATININE 1.9* 04/19/2014 1218   CREATININE 1.64* 09/10/2011 1204   CALCIUM 8.8 10/17/2014 1000   CALCIUM 9.1 04/19/2014 1218   PROT 6.7 10/17/2014 1000   PROT 6.6 04/19/2014 1218   ALBUMIN 4.0  10/17/2014 1000   ALBUMIN 3.5 04/19/2014 1218   AST 15 10/17/2014 1000   AST 14 04/19/2014 1218   ALT 14 10/17/2014 1000   ALT 14 04/19/2014 1218   ALKPHOS 58 10/17/2014 1000   ALKPHOS 71 04/19/2014 1218   BILITOT 0.4 10/17/2014 1000   BILITOT 0.42 04/19/2014 1218   GFRNONAA 40* 10/17/2014 1000   GFRNONAA 51* 01/19/2011 0605   GFRAA 46* 10/17/2014 1000   GFRAA >60 01/19/2011 0605    No results found for: SPEP, UPEP  Lab Results  Component Value Date   WBC 2.8* 10/31/2014   NEUTROABS 1.0* 10/31/2014   HGB 10.2* 10/31/2014   HCT 33.7* 10/31/2014   MCV 79.1* 10/31/2014   PLT 60* 10/31/2014      Chemistry      Component Value Date/Time   NA 138 10/17/2014 1000   NA 142 04/19/2014 1218   K 4.2 10/17/2014 1000   K 4.1 04/19/2014 1218   CL 104 10/17/2014 1000   CL 107 09/27/2012 0940   CO2 27 10/17/2014 1000   CO2 25 04/19/2014 1218   BUN 25* 10/17/2014 1000   BUN 26.4* 04/19/2014 1218   CREATININE 1.60*  10/17/2014 1000   CREATININE 1.9* 04/19/2014 1218   CREATININE 1.64* 09/10/2011 1204      Component Value Date/Time   CALCIUM 8.8 10/17/2014 1000   CALCIUM 9.1 04/19/2014 1218   ALKPHOS 58 10/17/2014 1000   ALKPHOS 71 04/19/2014 1218   AST 15 10/17/2014 1000   AST 14 04/19/2014 1218   ALT 14 10/17/2014 1000   ALT 14 04/19/2014 1218   BILITOT 0.4 10/17/2014 1000   BILITOT 0.42 04/19/2014 1218     ASSESSMENT & PLAN:  Non-Hodgkin lymphoma PET CT scan showed no evidence of active lymphoma However, I cannot explain the cause of his progressive pancytopenia I recommend bone marrow aspirate and biopsy   Deficiency anemia At first glance, the microcytic anemia was thought to be due to iron deficiency anemia. I recommended GI consult However, iron studies appeared adequate I recommend bone marrow aspirate and biopsy for further evaluation He is not symptomatic from anemia and I recommend observation without the need for transfusion now    Bone Marrow Biopsy  and Aspiration Procedure Note   Informed consent was obtained and potential risks including bleeding, infection and pain were reviewed with the patient.  The patient's name, date of birth, identification, consent and allergies were verified prior to the start of procedure and time out was performed. The right posterior iliac crest was chosen as the site of biopsy.  The skin was prepped with Betadine solution.   8 cc of 1% lidocaine was used to provide local anaesthesia.   10 cc of bone marrow aspirate was obtained followed by 1 inch biopsy.   The procedure was tolerated well and there were no complications.  The patient was stable at the end of the procedure.  Specimens sent for flow cytometry, cytogenetics and additional studies.  All questions were answered. The patient knows to call the clinic with any problems, questions or concerns. No barriers to learning was detected. I spent 25 minutes counseling the patient face to face. The total time spent in the appointment was 30 minutes and more than 50% was on counseling and review of test results     [facility], Ricardo Wuertz, MD 11/07/2014 9:22 AM

## 2014-11-07 NOTE — Assessment & Plan Note (Signed)
At first glance, the microcytic anemia was thought to be due to iron deficiency anemia. I recommended GI consult However, iron studies appeared adequate I recommend bone marrow aspirate and biopsy for further evaluation He is not symptomatic from anemia and I recommend observation without the need for transfusion now

## 2014-11-11 ENCOUNTER — Other Ambulatory Visit (INDEPENDENT_AMBULATORY_CARE_PROVIDER_SITE_OTHER): Payer: Medicare Other

## 2014-11-11 DIAGNOSIS — D61818 Other pancytopenia: Secondary | ICD-10-CM | POA: Diagnosis not present

## 2014-11-11 DIAGNOSIS — D649 Anemia, unspecified: Secondary | ICD-10-CM | POA: Diagnosis not present

## 2014-11-11 LAB — FECAL OCCULT BLOOD, IMMUNOCHEMICAL: FECAL OCCULT BLD: NEGATIVE

## 2014-11-15 LAB — TISSUE HYBRIDIZATION (BONE MARROW)-NCBH

## 2014-11-15 LAB — CHROMOSOME ANALYSIS, BONE MARROW

## 2014-11-19 ENCOUNTER — Ambulatory Visit (HOSPITAL_BASED_OUTPATIENT_CLINIC_OR_DEPARTMENT_OTHER): Payer: Medicare Other | Admitting: Hematology and Oncology

## 2014-11-19 ENCOUNTER — Telehealth: Payer: Self-pay | Admitting: Hematology and Oncology

## 2014-11-19 VITALS — BP 142/71 | HR 86 | Temp 97.5°F | Resp 18 | Ht 66.5 in | Wt 221.3 lb

## 2014-11-19 DIAGNOSIS — D46C Myelodysplastic syndrome with isolated del(5q) chromosomal abnormality: Secondary | ICD-10-CM

## 2014-11-19 DIAGNOSIS — D539 Nutritional anemia, unspecified: Secondary | ICD-10-CM

## 2014-11-19 NOTE — Telephone Encounter (Signed)
Pt confirmed labs/ov per 05/31 POF, gave pt AVS and Calendar..... KJ °

## 2014-11-20 DIAGNOSIS — D469 Myelodysplastic syndrome, unspecified: Secondary | ICD-10-CM | POA: Insufficient documentation

## 2014-11-20 NOTE — Progress Notes (Signed)
[facility] OFFICE PROGRESS NOTE  Patient Care Team: [name], MD as PCP - General (Family Medicine) Heath Lark, MD as Consulting Physician (Hematology and Oncology)  SUMMARY OF ONCOLOGIC HISTORY: Oncology History   Calculated R-IPSS score at low risk     MDS (myelodysplastic syndrome) with 5q deletion   11/07/2014 Bone Marrow Biopsy Accession: [id number] marrow biopsy showed myelodysplastic syndrome with 5q deletion.    INTERVAL HISTORY: Please see below for problem oriented charting. He returns for further follow-up. He feels well. Complaint of mild arthritis pain.  REVIEW OF SYSTEMS:   Constitutional: Denies fevers, chills or abnormal weight loss Eyes: Denies blurriness of vision Ears, nose, mouth, throat, and face: Denies mucositis or sore throat Respiratory: Denies cough, dyspnea or wheezes Cardiovascular: Denies palpitation, chest discomfort or lower extremity swelling Gastrointestinal:  Denies nausea, heartburn or change in bowel habits Skin: Denies abnormal skin rashes Lymphatics: Denies new lymphadenopathy or easy bruising Neurological:Denies numbness, tingling or new weaknesses Behavioral/Psych: Mood is stable, no new changes  All other systems were reviewed with the patient and are negative.  I have reviewed the past medical history, past surgical history, social history and family history with the patient and they are unchanged from previous note.  ALLERGIES:  is allergic to glucophage; zetia; fenofibrate; and niacin-lovastatin er.  MEDICATIONS:  Current Outpatient Prescriptions  Medication Sig Dispense Refill  . acetaminophen (TYLENOL) 500 MG tablet Take 500 mg by mouth every 6 (six) hours as needed for pain.    Marland Kitchen atorvastatin (LIPITOR) 10 MG tablet TAKE 1/2 TABLET BY MOUTH DAILY 30 tablet 5  . clopidogrel (PLAVIX) 75 MG tablet TAKE 1 TABLET BY MOUTH DAILY WITH BREAKFAST. 90 tablet 1  . HYDROcodone-acetaminophen (NORCO) 5-325 MG per  tablet Take 1 tablet by mouth every 6 (six) hours as needed for moderate pain. 30 tablet 0  . LANTUS SOLOSTAR 100 UNIT/ML Solostar Pen INJECT 25 UNITS INTO THE SKIN AT BEDTIME. (Patient taking differently: INJECT 16 UNITS INTO THE SKIN AT BEDTIME.) 15 pen 5  . metoprolol succinate (TOPROL-XL) 100 MG 24 hr tablet TAKE 1 TABLET (100 MG TOTAL) BY MOUTH DAILY. TAKE WITH OR IMMEDIATELY FOLLOWING A MEAL. 90 tablet 3  . oxybutynin (DITROPAN) 5 MG tablet   10  . pioglitazone (ACTOS) 30 MG tablet TAKE 1 TABLET EVERY DAY 90 tablet 3  . quinapril (ACCUPRIL) 40 MG tablet TAKE 1 TABLET BY MOUTH EVERY DAY (Patient taking differently: 20 mg. TAKE 1 TABLET BY MOUTH EVERY DAY) 90 tablet 2  . Tamsulosin HCl (FLOMAX) 0.4 MG CAPS Take 0.4 mg by mouth daily.      . [DISCONTINUED] cloNIDine (CATAPRES) 0.1 MG tablet Take 0.1 mg by mouth 2 (two) times daily.       No current facility-administered medications for this visit.    PHYSICAL EXAMINATION: ECOG PERFORMANCE STATUS: 0 - Asymptomatic  Filed Vitals:   11/19/14 1449  BP: 142/71  Pulse: 86  Temp: 97.5 F (36.4 C)  Resp: 18   Filed Weights   11/19/14 1449  Weight: 221 lb 4.8 oz (100.381 kg)    GENERAL:alert, no distress and comfortable SKIN: skin color, texture, turgor are normal, no rashes or significant lesions EYES: normal, Conjunctiva are pink and non-injected, sclera clear Musculoskeletal:no cyanosis of digits and no clubbing  NEURO: alert & oriented x 3 with fluent speech, no focal motor/sensory deficits  LABORATORY DATA:  I have reviewed the data as listed    Component Value Date/Time   NA 138  10/17/2014 1000   NA 142 04/19/2014 1218   K 4.2 10/17/2014 1000   K 4.1 04/19/2014 1218   CL 104 10/17/2014 1000   CL 107 09/27/2012 0940   CO2 27 10/17/2014 1000   CO2 25 04/19/2014 1218   GLUCOSE 133* 10/17/2014 1000   GLUCOSE 118 04/19/2014 1218   GLUCOSE 89 09/27/2012 0940   BUN 25* 10/17/2014 1000   BUN 26.4* 04/19/2014 1218    CREATININE 1.60* 10/17/2014 1000   CREATININE 1.9* 04/19/2014 1218   CREATININE 1.64* 09/10/2011 1204   CALCIUM 8.8 10/17/2014 1000   CALCIUM 9.1 04/19/2014 1218   PROT 6.7 10/17/2014 1000   PROT 6.6 04/19/2014 1218   ALBUMIN 4.0 10/17/2014 1000   ALBUMIN 3.5 04/19/2014 1218   AST 15 10/17/2014 1000   AST 14 04/19/2014 1218   ALT 14 10/17/2014 1000   ALT 14 04/19/2014 1218   ALKPHOS 58 10/17/2014 1000   ALKPHOS 71 04/19/2014 1218   BILITOT 0.4 10/17/2014 1000   BILITOT 0.42 04/19/2014 1218   GFRNONAA 40* 10/17/2014 1000   GFRNONAA 51* 01/19/2011 0605   GFRAA 46* 10/17/2014 1000   GFRAA >60 01/19/2011 0605    No results found for: SPEP, UPEP  Lab Results  Component Value Date   WBC 2.8* 10/31/2014   NEUTROABS 1.0* 10/31/2014   HGB 10.2* 10/31/2014   HCT 33.7* 10/31/2014   MCV 79.1* 10/31/2014   PLT 60* 10/31/2014      Chemistry      Component Value Date/Time   NA 138 10/17/2014 1000   NA 142 04/19/2014 1218   K 4.2 10/17/2014 1000   K 4.1 04/19/2014 1218   CL 104 10/17/2014 1000   CL 107 09/27/2012 0940   CO2 27 10/17/2014 1000   CO2 25 04/19/2014 1218   BUN 25* 10/17/2014 1000   BUN 26.4* 04/19/2014 1218   CREATININE 1.60* 10/17/2014 1000   CREATININE 1.9* 04/19/2014 1218   CREATININE 1.64* 09/10/2011 1204      Component Value Date/Time   CALCIUM 8.8 10/17/2014 1000   CALCIUM 9.1 04/19/2014 1218   ALKPHOS 58 10/17/2014 1000   ALKPHOS 71 04/19/2014 1218   AST 15 10/17/2014 1000   AST 14 04/19/2014 1218   ALT 14 10/17/2014 1000   ALT 14 04/19/2014 1218   BILITOT 0.4 10/17/2014 1000   BILITOT 0.42 04/19/2014 1218      ASSESSMENT & PLAN:  MDS (myelodysplastic syndrome) 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 I reviewed the bone marrow biopsy report.The patient have refractory anemia with multilineage dysplasia. 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 The patient is currently not symptomatic. He reported no signs and symptoms of recurrent infection, anemia with chest pain or shortness of breath or spontaneous bleeding complications. At this point in time, I recommend observation only. I will see him back in 3 months with repeat history, physical examination and blood work. I warned the patient and family members signs and symptoms to watch out for progression of disease. I addressed all their questions and concerns.    Orders Placed This Encounter  Procedures  . CBC & Diff and Retic    Standing Status: Future     Number of Occurrences:      Standing Expiration Date: 12/24/2015  . Morphology    Standing Status: Future     Number of Occurrences:  Standing Expiration Date: 12/24/2015   All questions were answered. The patient knows to call the clinic with any problems, questions or concerns. No barriers to learning was detected. I spent 25 minutes counseling the patient face to face. The total time spent in the appointment was 30 minutes and more than 50% was on counseling and review of test results     [facility], [place], MD 11/20/2014 3:40 PM

## 2014-11-20 NOTE — Assessment & Plan Note (Signed)
I had a long discussion with the patient and family. I reviewed the bone marrow biopsy report.The patient have refractory anemia with multilineage dysplasia. 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 The patient is currently not symptomatic. He reported no signs and symptoms of recurrent infection, anemia with chest pain or shortness of breath or spontaneous bleeding complications. At this point in time, I recommend observation only. I will see him back in 3 months with repeat history, physical examination and blood work. I warned the patient and family members signs and symptoms to watch out for progression of disease. I addressed all their questions and concerns.

## 2014-11-22 ENCOUNTER — Encounter (HOSPITAL_COMMUNITY): Payer: Self-pay

## 2014-11-25 ENCOUNTER — Ambulatory Visit (INDEPENDENT_AMBULATORY_CARE_PROVIDER_SITE_OTHER): Payer: Medicare Other | Admitting: *Deleted

## 2014-11-25 DIAGNOSIS — E538 Deficiency of other specified B group vitamins: Secondary | ICD-10-CM | POA: Diagnosis not present

## 2014-11-25 MED ORDER — CYANOCOBALAMIN 1000 MCG/ML IJ SOLN
1000.0000 ug | Freq: Once | INTRAMUSCULAR | Status: AC
  Administered 2014-11-25: 1000 ug via INTRAMUSCULAR

## 2014-12-03 ENCOUNTER — Other Ambulatory Visit: Payer: Self-pay | Admitting: Family Medicine

## 2014-12-17 ENCOUNTER — Telehealth (HOSPITAL_COMMUNITY): Payer: Self-pay | Admitting: *Deleted

## 2014-12-20 ENCOUNTER — Ambulatory Visit: Payer: Medicare Other | Admitting: Family Medicine

## 2014-12-23 ENCOUNTER — Other Ambulatory Visit: Payer: Self-pay | Admitting: Family Medicine

## 2014-12-24 ENCOUNTER — Ambulatory Visit: Payer: Medicare Other | Admitting: Family Medicine

## 2014-12-25 ENCOUNTER — Encounter: Payer: Self-pay | Admitting: Family Medicine

## 2014-12-25 ENCOUNTER — Ambulatory Visit (INDEPENDENT_AMBULATORY_CARE_PROVIDER_SITE_OTHER): Payer: Medicare Other | Admitting: Family Medicine

## 2014-12-25 VITALS — BP 124/72 | HR 78 | Temp 97.3°F | Resp 16 | Ht 66.5 in | Wt 217.0 lb

## 2014-12-25 DIAGNOSIS — E538 Deficiency of other specified B group vitamins: Secondary | ICD-10-CM | POA: Diagnosis not present

## 2014-12-25 DIAGNOSIS — I2510 Atherosclerotic heart disease of native coronary artery without angina pectoris: Secondary | ICD-10-CM | POA: Diagnosis not present

## 2014-12-25 DIAGNOSIS — J988 Other specified respiratory disorders: Secondary | ICD-10-CM

## 2014-12-25 DIAGNOSIS — B9689 Other specified bacterial agents as the cause of diseases classified elsewhere: Secondary | ICD-10-CM

## 2014-12-25 DIAGNOSIS — D46C Myelodysplastic syndrome with isolated del(5q) chromosomal abnormality: Secondary | ICD-10-CM

## 2014-12-25 LAB — CBC W/MCH & 3 PART DIFF
HCT: 35.5 % — ABNORMAL LOW (ref 39.0–52.0)
Hemoglobin: 10.7 g/dL — ABNORMAL LOW (ref 13.0–17.0)
Lymphocytes Relative: 41 % (ref 12–46)
Lymphs Abs: 1.9 10*3/uL (ref 0.7–4.0)
MCH: 23.9 pg — AB (ref 26.0–34.0)
MCHC: 30.1 g/dL (ref 30.0–36.0)
MCV: 79.4 fL (ref 78.0–100.0)
NEUTROS PCT: 52 % (ref 43–77)
Neutro Abs: 2.4 10*3/uL (ref 1.7–7.7)
PLATELETS: 35 10*3/uL — AB (ref 150–400)
RBC: 4.47 MIL/uL (ref 4.22–5.81)
RDW: 30.8 % — ABNORMAL HIGH (ref 11.5–15.5)
WBC mixed population %: 7 % (ref 3–18)
WBC mixed population: 0.3 10*3/uL (ref 0.1–1.8)
WBC: 4.6 10*3/uL (ref 4.0–10.5)

## 2014-12-25 MED ORDER — AMOXICILLIN-POT CLAVULANATE 875-125 MG PO TABS: 1.0000 | ORAL_TABLET | Freq: Two times a day (BID) | ORAL | Status: DC

## 2014-12-25 MED ORDER — CYANOCOBALAMIN 1000 MCG/ML IJ SOLN
1000.0000 ug | Freq: Once | INTRAMUSCULAR | Status: AC
  Administered 2014-12-25: 1000 ug via INTRAMUSCULAR

## 2014-12-25 MED ORDER — BENZONATATE 100 MG PO CAPS: 100.0000 mg | ORAL_CAPSULE | Freq: Three times a day (TID) | ORAL | Status: DC

## 2014-12-25 NOTE — Assessment & Plan Note (Signed)
CBC shows a mounted immune response, with infection per above, discussed low PLT with his oncologist, recheck in 2 weeks, expect to this result with current infection, discussed with family,no signs of  Spontaneous bleeding

## 2014-12-25 NOTE — Progress Notes (Signed)
Patient ID: Ricardo Watkins, male   DOB: 09-14-1930, 81 y.o.   MRN: [id number]   Subjective:    Patient ID: Ricardo Watkins, male    DOB: 1930-11-17, 81 y.o.   MRN: [id number]  Patient presents for Illness and Vitamin B12 Injection  patient here with his daughter and his wife. For the past 4 days he's had cough with production which has been worsening he's also had a low-grade fever headache and a sore throat. No known sick contacts. He does have myelodysplastic syndrome and history of non-Hodgkin's lymphoma. He has been taking over-the-counter Coricidin as well as Tylenol for the fever. The cough is keeping him up at nighttime. He has not had any nausea vomiting or change in his bowels. His appetite has been decreased    Review Of Systems: per above   GEN- + fatigue, +fever, weight loss,weakness, recent illness HEENT- denies eye drainage, change in vision, nasal discharge, CVS- denies chest pain, palpitations RESP- denies SOB, +cough, wheeze ABD- denies N/V, change in stools, abd pain GU- denies dysuria, hematuria, dribbling, incontinence MSK- denies joint pain, muscle aches, injury Neuro- denies headache, dizziness, syncope, seizure activity       Objective:    BP 124/72 mmHg  Pulse 78  Temp(Src) 97.3 F (36.3 C) (Oral)  Resp 16  Ht 5' 6.5" (1.689 m)  Wt 217 lb (98.431 kg)  BMI 34.50 kg/m2 GEN- NAD, alert and oriented x3 HEENT- PERRL, EOMI, non injected sclera, pink conjunctiva, MMM, oropharynx clear, no maxillary sinus tenderness, TM clear bilat no effusion, nares clear rhinorrhea  Neck- Supple, no tLAD CVS- RRR, 2/6 SEM RESP-bilat rhonchi, no wheeze, harsh cough, normal WOB at baseline ABD-NABS,soft,NT,ND EXT- No edema Pulses- Radial 2+        Assessment & Plan:      Problem List Items Addressed This Visit    Vitamin B12 deficiency   MDS (myelodysplastic syndrome) with 5q deletion    CBC shows a mounted immune response, with infection per above, discussed low PLT  with his oncologist, recheck in 2 weeks, expect to this result with current infection, discussed with family,no signs of  Spontaneous bleeding      Relevant Orders   CBC with Differential    Other Visit Diagnoses    Bacterial respiratory infection    -  Primary    Treat with antibiotics, tessalon perrles, mucinex    Relevant Orders    CBC w/[facility] & 3 Part Diff (Completed)    Vitamin B 12 deficiency        Shot given    Relevant Medications    cyanocobalamin ((VITAMIN B-12)) injection 1,000 mcg (Completed)       Note: This dictation was prepared with Dragon dictation along with smaller Company secretary. Any transcriptional errors that result from this process are unintentional.

## 2014-12-25 NOTE — Patient Instructions (Signed)
Tessalon perrles for cough  Mucinex twice a day  Antibiotics twice a day  F/U as needed

## 2014-12-30 ENCOUNTER — Encounter: Payer: Self-pay | Admitting: Cardiology

## 2014-12-30 ENCOUNTER — Ambulatory Visit (INDEPENDENT_AMBULATORY_CARE_PROVIDER_SITE_OTHER): Payer: Medicare Other | Admitting: Cardiology

## 2014-12-30 VITALS — BP 142/70 | HR 72 | Ht 69.0 in | Wt 217.0 lb

## 2014-12-30 DIAGNOSIS — I2510 Atherosclerotic heart disease of native coronary artery without angina pectoris: Secondary | ICD-10-CM | POA: Diagnosis not present

## 2014-12-30 DIAGNOSIS — I10 Essential (primary) hypertension: Secondary | ICD-10-CM

## 2014-12-30 NOTE — Patient Instructions (Signed)
Your physician wants you to follow-up in: 1 year with Dr. [name]. You will receive a reminder letter in the mail two months in advance. If you don't receive a letter, please call our office to schedule the follow-up appointment.  Please continue your current medications.

## 2014-12-30 NOTE — Progress Notes (Signed)
HPI  The patient presents for followup of his known coronary disease. Since I last saw him he has been diagnosed with non-Hodgkin's lymphoma and is having this worked up but not yet treated. His platelets have fallen. He doesn't have any new cardiovascular complaints. He continues to have lower extremity swelling which is basically unchanged from previous. He denies any new chest pressure, neck or arm discomfort. He has no new shortness of breath, PND or orthopnea. He's had no weight gain.  He has had some orthostatic symptoms and has actually fallen because of this. Apparently his blood pressure medicines have been adjusted. He's learned to avoid sudden changes in position.   Allergies  Allergen Reactions  . Glucophage [Metformin Hydrochloride]     Chest pain  . Zetia [Ezetimibe]     weakness  . Fenofibrate Rash  . Niacin-Lovastatin Er Rash    Current Outpatient Prescriptions  Medication Sig Dispense Refill  . acetaminophen (TYLENOL) 500 MG tablet Take 500 mg by mouth every 6 (six) hours as needed for pain.    Marland Kitchen amoxicillin-clavulanate (AUGMENTIN) 875-125 MG per tablet Take 1 tablet by mouth 2 (two) times daily. 20 tablet 0  . atorvastatin (LIPITOR) 10 MG tablet TAKE 1/2 TABLET BY MOUTH DAILY 30 tablet 5  . benzonatate (TESSALON) 100 MG capsule Take 1 capsule (100 mg total) by mouth 3 (three) times daily. 20 capsule 1  . clopidogrel (PLAVIX) 75 MG tablet TAKE 1 TABLET BY MOUTH DAILY WITH BREAKFAST. 90 tablet 1  . HYDROcodone-acetaminophen (NORCO) 5-325 MG per tablet Take 1 tablet by mouth every 6 (six) hours as needed for moderate pain. 30 tablet 0  . LANTUS SOLOSTAR 100 UNIT/ML Solostar Pen INJECT 25 UNITS INTO THE SKIN AT BEDTIME. (Patient taking differently: INJECT 16 UNITS INTO THE SKIN AT BEDTIME.) 15 pen 5  . metoprolol succinate (TOPROL-XL) 100 MG 24 hr tablet TAKE 1 TABLET DAILY WITH A MEAL OR IMMEDIATELY FOLLOWING A MEAL 90 tablet 3  . oxybutynin (DITROPAN) 5 MG tablet   10  .  pioglitazone (ACTOS) 30 MG tablet TAKE 1 TABLET EVERY DAY 90 tablet 3  . quinapril (ACCUPRIL) 40 MG tablet TAKE 1 TABLET BY MOUTH EVERY DAY (Patient taking differently: 20 mg. TAKE 1 TABLET BY MOUTH EVERY DAY) 90 tablet 2  . Tamsulosin HCl (FLOMAX) 0.4 MG CAPS Take 0.4 mg by mouth daily.      . [DISCONTINUED] cloNIDine (CATAPRES) 0.1 MG tablet Take 0.1 mg by mouth 2 (two) times daily.       No current facility-administered medications for this visit.    Past Medical History  Diagnosis Date  . PVD (peripheral vascular disease)     rt renal artery stent  . CAD (coronary artery disease)     30% LAD Stenosis, 70% ramus intermedius stenosis, treated with PTCA and angioplasty by Dr Albertine Patricia 2004  . DM (diabetes mellitus)   . Dyslipidemia   . Renal insufficiency   . Thrombocytopenia   . Hypertension   . Vitamin D deficiency   . Non Hodgkin's lymphoma   . Inguinal hernia     right  . Adenomatous colon polyp   . Cataract     right eye  . Hyperlipidemia   . Prostate cancer     seed implants no recurrence  . 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 Prostate ca 09/10/2011  Gleason 3+3 R, 3+4 L lobe May 2007 Rx Radioactive seed implants Dr [name]  . DVT (deep venous thrombosis)     secondary to surgery  . UTI (urinary tract infection)   . 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 Deficiency anemia 04/19/2014  . Diverticulosis     Past Surgical History  Procedure Laterality Date  . Cystourethroscopy      ROBOTIC ARM NUCLETRON SEED IMPLANTATION OF PROSTATE  . Exploratory laparotomy      For evaluation of lymphoma  . Colonoscopy    . Appendectomy      patient ?    ROS:  As stated in the HPI and negative for all other systems.  PHYSICAL EXAM BP 142/70 mmHg  Pulse 72  Ht 5\' 9"  (1.753 m)  Wt  217 lb (98.431 kg)  BMI 32.03 kg/m2 GENERAL:  Well appearing HEENT:  Pupils equal round and reactive, fundi not visualized, oral mucosa unremarkable NECK:  No jugular venous distention, waveform within normal limits, carotid upstroke brisk and symmetric, positive bilateral soft bruits versus transmitted systolic murmurs, no thyromegaly LYMPHATICS:  No cervical, inguinal adenopathy LUNGS:  Clear to auscultation bilaterally BACK:  No CVA tenderness CHEST:  Unremarkable HEART:  PMI not displaced or sustained,S1 and S2 within normal limits, no S3, no S4, no clicks, no rubs, 2/6 apical systolic murmur radiating out the aortic outflow tract short and early peaking, no diastolic murmurs ABD:  Flat, positive bowel sounds normal in frequency in pitch, no bruits, no rebound, no guarding, no midline pulsatile mass, no hepatomegaly, no splenomegaly EXT:  2 plus pulses throughout,  mild to moderate bilateral lower extremityedema, no cyanosis no clubbing SKIN:  No rashes no nodules   EKG:  Sinus rhythm, rate 72, axis within normal limits, intervals within normal limits, poor anterior R wave progression, nonspecific T-wave flattening,. 12/30/2014  ASSESSMENT AND PLAN  CAD:  The patient has no new sypmtoms.  No further cardiovascular testing is indicated.  We will continue with aggressive risk reduction and meds as listed.  CAROTID STENOSIS:  In January he had 50-69% left stenosis and we will follow this again next January.  HYPERLIPIDMEIA:  His LDL in  April was 56.  He will remain on the meds as listed.  CKD:  Creat has been stable.  EDEMA:  We discussed conservative therapies.    ORTHOSTATIC HYPOTENSION:  He is describing this. We discussed conservative therapies. He might want to consider compression stockings.

## 2015-01-01 ENCOUNTER — Other Ambulatory Visit: Payer: Self-pay | Admitting: *Deleted

## 2015-01-01 ENCOUNTER — Other Ambulatory Visit: Payer: Self-pay | Admitting: Family Medicine

## 2015-01-01 DIAGNOSIS — I2510 Atherosclerotic heart disease of native coronary artery without angina pectoris: Secondary | ICD-10-CM

## 2015-01-01 NOTE — Telephone Encounter (Signed)
Medication refilled per protocol. 

## 2015-01-02 DIAGNOSIS — E119 Type 2 diabetes mellitus without complications: Secondary | ICD-10-CM | POA: Diagnosis not present

## 2015-01-02 LAB — HM DIABETES EYE EXAM

## 2015-01-07 ENCOUNTER — Other Ambulatory Visit: Payer: Self-pay | Admitting: Family Medicine

## 2015-01-07 NOTE — Telephone Encounter (Signed)
Refill appropriate and filled per protocol. 

## 2015-01-08 ENCOUNTER — Other Ambulatory Visit: Payer: Medicare Other

## 2015-01-08 DIAGNOSIS — D46C Myelodysplastic syndrome with isolated del(5q) chromosomal abnormality: Secondary | ICD-10-CM | POA: Diagnosis not present

## 2015-01-09 ENCOUNTER — Telehealth: Payer: Self-pay | Admitting: Family Medicine

## 2015-01-09 LAB — PATHOLOGIST SMEAR REVIEW

## 2015-01-09 LAB — CBC WITH DIFFERENTIAL/PLATELET
BASOS ABS: 0 10*3/uL (ref 0.0–0.1)
Basophils Relative: 0 % (ref 0–1)
Eosinophils Absolute: 0 10*3/uL (ref 0.0–0.7)
Eosinophils Relative: 1 % (ref 0–5)
HEMATOCRIT: 33.6 % — AB (ref 39.0–52.0)
Hemoglobin: 10.4 g/dL — ABNORMAL LOW (ref 13.0–17.0)
Lymphocytes Relative: 67 % — ABNORMAL HIGH (ref 12–46)
Lymphs Abs: 2.4 10*3/uL (ref 0.7–4.0)
MCH: 23.5 pg — AB (ref 26.0–34.0)
MCHC: 31 g/dL (ref 30.0–36.0)
MCV: 76 fL — AB (ref 78.0–100.0)
MONOS PCT: 8 % (ref 3–12)
Monocytes Absolute: 0.3 10*3/uL (ref 0.1–1.0)
Neutro Abs: 0.9 10*3/uL — ABNORMAL LOW (ref 1.7–7.7)
Neutrophils Relative %: 24 % — ABNORMAL LOW (ref 43–77)
Platelets: 49 10*3/uL — ABNORMAL LOW (ref 150–400)
RBC: 4.42 MIL/uL (ref 4.22–5.81)
RDW: 27.3 % — ABNORMAL HIGH (ref 11.5–15.5)
WBC: 3.6 10*3/uL — ABNORMAL LOW (ref 4.0–10.5)

## 2015-01-09 NOTE — Telephone Encounter (Signed)
Lab calling with critical Platelet count for pt.  Platelet count 49.  Results given to Dr Dennard Schaumann.

## 2015-01-09 NOTE — Telephone Encounter (Signed)
Pt informed about lab results.  Has appt on 8/5/ to see provider.

## 2015-01-09 NOTE — Telephone Encounter (Signed)
Platelet count has actually improved since when he saw Dr. Buelah Manis. It was 35 then it is at 23 now. His baseline is between 30 and 70. I would like to see the patient back in recheck in one month. I anticipate gradual improvement over the next month.

## 2015-01-13 ENCOUNTER — Telehealth: Payer: Self-pay | Admitting: *Deleted

## 2015-01-13 NOTE — Telephone Encounter (Signed)
-----   Message from Heath Lark, MD sent at 01/13/2015  9:45 AM EDT ----- Regarding: abnormal labs Received notification from primary doctor regarding abnormal labs Can you call and see how he is doing? Does he need to see me sooner, if so we can move it to next Monday 8/1. Thanks

## 2015-01-13 NOTE — Telephone Encounter (Signed)
Spoke with patient, states he is doing better, feeling OK. Does not need to be seen sooner

## 2015-01-17 ENCOUNTER — Other Ambulatory Visit: Payer: Self-pay | Admitting: Family Medicine

## 2015-01-17 NOTE — Telephone Encounter (Signed)
Medication refilled per protocol. 

## 2015-01-24 ENCOUNTER — Ambulatory Visit (INDEPENDENT_AMBULATORY_CARE_PROVIDER_SITE_OTHER): Payer: Medicare Other | Admitting: Family Medicine

## 2015-01-24 ENCOUNTER — Encounter: Payer: Self-pay | Admitting: Family Medicine

## 2015-01-24 VITALS — BP 126/74 | HR 68 | Temp 98.2°F | Resp 16 | Ht 69.0 in | Wt 215.0 lb

## 2015-01-24 DIAGNOSIS — IMO0001 Reserved for inherently not codable concepts without codable children: Secondary | ICD-10-CM

## 2015-01-24 DIAGNOSIS — R42 Dizziness and giddiness: Secondary | ICD-10-CM

## 2015-01-24 DIAGNOSIS — E119 Type 2 diabetes mellitus without complications: Secondary | ICD-10-CM

## 2015-01-24 DIAGNOSIS — I2510 Atherosclerotic heart disease of native coronary artery without angina pectoris: Secondary | ICD-10-CM

## 2015-01-24 DIAGNOSIS — Z794 Long term (current) use of insulin: Secondary | ICD-10-CM

## 2015-01-24 DIAGNOSIS — E538 Deficiency of other specified B group vitamins: Secondary | ICD-10-CM | POA: Diagnosis not present

## 2015-01-24 LAB — COMPLETE METABOLIC PANEL WITH GFR
ALK PHOS: 59 U/L (ref 40–115)
ALT: 9 U/L (ref 9–46)
AST: 11 U/L (ref 10–35)
Albumin: 3.5 g/dL — ABNORMAL LOW (ref 3.6–5.1)
BILIRUBIN TOTAL: 0.3 mg/dL (ref 0.2–1.2)
BUN: 24 mg/dL (ref 7–25)
CO2: 26 mmol/L (ref 20–31)
Calcium: 8.8 mg/dL (ref 8.6–10.3)
Chloride: 108 mmol/L (ref 98–110)
Creat: 1.59 mg/dL — ABNORMAL HIGH (ref 0.70–1.11)
GFR, EST AFRICAN AMERICAN: 46 mL/min — AB (ref >=60)
GFR, Est Non African American: 40 mL/min — ABNORMAL LOW (ref >=60)
GLUCOSE: 109 mg/dL — AB (ref 70–99)
POTASSIUM: 4.4 mmol/L (ref 3.5–5.3)
SODIUM: 142 mmol/L (ref 135–146)
Total Protein: 6.5 g/dL (ref 6.1–8.1)

## 2015-01-24 LAB — HEMOGLOBIN A1C
Hgb A1c MFr Bld: 7 % — ABNORMAL HIGH (ref <5.7)
Mean Plasma Glucose: 154 mg/dL — ABNORMAL HIGH (ref <117)

## 2015-01-24 MED ORDER — CYANOCOBALAMIN 1000 MCG/ML IJ SOLN
1000.0000 ug | Freq: Once | INTRAMUSCULAR | Status: AC
  Administered 2015-01-24: 1000 ug via INTRAMUSCULAR

## 2015-01-24 NOTE — Progress Notes (Signed)
Subjective:    Patient ID: Ricardo Watkins, male    DOB: 10/23/1930, 81 y.o.   MRN: [id number]  HPI  10/17/14 Over the last 3 weeks, the patient has been developing severe headaches on the right side of his head. He is also having dizziness with standing. He denies any syncope or presyncope however he feels extremely lightheaded whenever he stands up. He has fallen on 2 separate occasions. The second time he struck his occiput. He denies any chest pain, palpitations. He denies any vertigo. Today on examination Dix-Hallpike maneuver is negative. However the headache is starting to worsen. It is constant. He denies any blurry vision. He denies any neurologic deficits. He does have a history of a stroke. He denies any strokelike symptoms. He denies any diplopia. He denies any memory loss or personality changes. However his dizziness with standing is worsening. His blood pressure has been running low recently at home between 101 115/40-50.  At that time, my plan was: I'm concerned that the patient may be getting dizzy and falling due to low blood pressure. Therefore I'm going to have the patient temporarily discontinue amlodipine as well as Flomax. I will recheck him here next week to see if his symptoms are improving. However given the fact that he has now developed a daily headache along with dizziness and he is recently had his head several times after falls, I will obtain a head CT without contrast to evaluate for subdural hematoma versus other intracranial pathology. Given his chronic kidney disease, I will not be able to use contrast. However we should be able to see any sign of bleeding without contrast. Patient's blood pressure is low. I will discontinue amlodipine. While he is here with a check a hemoglobin A1c along with a urine microalbumin to monitor his insulin-dependent diabetes mellitus. We may need to decrease his medication if his blood sugar is too tightly controlled. Given his age, and ideal  hemoglobin A1c will be between 7 and 8. I will also check a fasting lipid panel. Goal LDL cholesterol is less than 70 given his history of coronary artery disease as well as stroke. Patient is also having some chronic low back pain. This back pain has worsened after his fall yesterday. There is no tenderness to palpation over the spinous processes of the lumbar spine. The pain is more muscular in nature and Tylenol is not relieving his pain. Because of his chronic kidney disease he cannot take NSAIDs. Therefore I did give the patient hydrocodone/acetaminophen 5/325 one by mouth every 6 hours when necessary pain  10/24/14 Patient states he is doing much better. He has not fallen anymore. He is no longer having dizzy spells. Patient did have to resume the Flomax as he was having urinary retention without it. However he continues to refrain from using amlodipine. Unfortunately he saw his hematologist recently. His myelodysplasia is worsening. They're concerned about a reactivation of his non-Hodgkin's lymphoma. This scheduled the patient for a PET scan.  At that time, my plan was: Patient's dizziness seems to be related to his blood pressure. He feels much better since discontinuing amlodipine. His head CT showed no stroke, intracranial hemorrhage, or brain tumor. Therefore no further follow-up is necessary for this condition. I will eagerly await the results of his PET scan  01/24/15 Patient continues to complain of dizziness. He complains of poor balance with standing. His blood pressure at home has been low at times between 110 and 120/60-70. He is also on numerous  medications that can also affect his balance including oxybutynin, Flomax, and occasional pain medication. On examination today he has +1 pitting edema in both legs and faint bibasilar crackles. The patient is on Actos for diabetes. Past Medical History  Diagnosis Date  . PVD (peripheral vascular disease)     rt renal artery stent  . CAD (coronary  artery disease)     30% LAD Stenosis, 70% ramus intermedius stenosis, treated with PTCA and angioplasty by Dr Albertine Patricia 2004  . DM (diabetes mellitus)   . Dyslipidemia   . Renal insufficiency   . Thrombocytopenia   . Hypertension   . Vitamin D deficiency   . Non Hodgkin's lymphoma   . Inguinal hernia     right  . Adenomatous colon polyp   . Cataract     right eye  . Hyperlipidemia   . Prostate cancer     seed implants no recurrence  . 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 Prostate ca 09/10/2011    Gleason 3+3 R, 3+4 L lobe May 2007 Rx Radioactive seed implants Dr [name]  . DVT (deep venous thrombosis)     secondary to surgery  . UTI (urinary tract infection)   . 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 Deficiency anemia 04/19/2014  . Diverticulosis    Past Surgical History  Procedure Laterality Date  . Cystourethroscopy      ROBOTIC ARM NUCLETRON SEED IMPLANTATION OF PROSTATE  . Exploratory laparotomy      For evaluation of lymphoma  . Colonoscopy    . Appendectomy      patient ?   Current Outpatient Prescriptions on File Prior to Visit  Medication Sig Dispense Refill  . acetaminophen (TYLENOL) 500 MG tablet Take 500 mg by mouth every 6 (six) hours as needed for pain.    Marland Kitchen atorvastatin (LIPITOR) 10 MG tablet TAKE 1/2 TABLET BY MOUTH DAILY 30 tablet 5  . clopidogrel (PLAVIX) 75 MG tablet TAKE 1 TABLET BY MOUTH DAILY WITH BREAKFAST. 90 tablet 1  . HYDROcodone-acetaminophen (NORCO) 5-325 MG per tablet Take 1 tablet by mouth every 6 (six) hours as needed for moderate pain. 30 tablet 0  . LANTUS SOLOSTAR 100 UNIT/ML Solostar Pen INJECT 25 UNITS INTO THE SKIN AT BEDTIME. (Patient taking differently: INJECT 16 UNITS INTO THE SKIN in AM) 15 pen 5  . metoprolol succinate (TOPROL-XL) 100 MG 24  hr tablet TAKE 1 TABLET DAILY WITH A MEAL OR IMMEDIATELY FOLLOWING A MEAL 90 tablet 3  . oxybutynin (DITROPAN) 5 MG tablet Take 5 mg by mouth daily.   10  . pioglitazone (ACTOS) 30 MG tablet TAKE 1 TABLET EVERY DAY 90 tablet 1  . quinapril (ACCUPRIL) 40 MG tablet TAKE 1 TABLET BY MOUTH EVERY DAY (Patient taking differently: TAKE 1/2 TABLET BY MOUTH EVERY DAY) 90 tablet 0  . Tamsulosin HCl (FLOMAX) 0.4 MG CAPS Take 0.4 mg by mouth daily.      . [DISCONTINUED] cloNIDine (CATAPRES) 0.1 MG tablet Take 0.1 mg by mouth 2 (two) times daily.       No current facility-administered medications on file prior to visit.   Allergies  Allergen Reactions  . Glucophage [Metformin Hydrochloride]     Chest pain  . Zetia [Ezetimibe]     weakness  .  Fenofibrate Rash  . Niacin-Lovastatin Er Rash   History   Social History  . Marital Status: Married    Spouse Name: N/A  . Number of Children: 4  . Years of Education: N/A   Occupational History  . retired    Social History Main Topics  . Smoking status: Former Smoker    Types: Cigarettes    Quit date: 06/21/1958  . Smokeless tobacco: Never Used     Comment: Quit 30 years ago. and use to smoke pipe but quit  . Alcohol Use: No  . Drug Use: No  . Sexual Activity:    Partners: Female   Other Topics Concern  . Not on file   Social History Narrative     Review of Systems  All other systems reviewed and are negative.      Objective:   Physical Exam  Constitutional: He is oriented to person, place, and time. He appears well-developed and well-nourished.  HENT:  Nose: Nose normal.  Mouth/Throat: Oropharynx is clear and moist.  Neck: Neck supple.  Cardiovascular: Normal rate, regular rhythm and normal heart sounds.   No murmur heard. Pulmonary/Chest: Effort normal and breath sounds normal. No respiratory distress. He has no wheezes. He has no rales.  Abdominal: Soft. Bowel sounds are normal. He exhibits no distension. There is no  tenderness. There is no rebound and no guarding.  Musculoskeletal: He exhibits no edema.  Lymphadenopathy:    He has no cervical adenopathy.  Neurological: He is alert and oriented to person, place, and time. He has normal reflexes. No cranial nerve deficit. He exhibits normal muscle tone. Coordination normal.  Vitals reviewed.         Assessment & Plan:  IDDM (insulin dependent diabetes mellitus) - Plan: Hemoglobin A1c, COMPLETE METABOLIC PANEL WITH GFR  Vitamin B 12 deficiency - Plan: cyanocobalamin ((VITAMIN B-12)) injection 1,000 mcg  Dizziness - Plan: Ambulatory referral to Physical Therapy  I have asked the patient to discontinue oxybutynin. I would like him to decrease Toprol-XL to 50 mg by mouth daily. I would like to recheck in 2 weeks to see if his dizziness has improved. I will also schedule the patient for physical therapy to try to improve his strength, his coordination, and his balance. I will check a hemoglobin A1c and as long as his hemoglobin A1c is less than 8, I will discontinue Actos due to the swelling.

## 2015-01-28 ENCOUNTER — Encounter: Payer: Self-pay | Admitting: Family Medicine

## 2015-01-30 ENCOUNTER — Telehealth: Payer: Self-pay | Admitting: Family Medicine

## 2015-01-30 ENCOUNTER — Ambulatory Visit: Payer: Medicare Other | Admitting: Podiatry

## 2015-01-30 NOTE — Telephone Encounter (Signed)
Called spoke to son Elta [place].  Told resume Toprol 100 mg daily and stop Oxybutynin.

## 2015-01-30 NOTE — Telephone Encounter (Signed)
Increase toprol back to 100 a day bu stay off oxybutynin.

## 2015-01-30 NOTE — Telephone Encounter (Signed)
Daughter calling about father's BP.  Is now only taking the 50 mg of Toprol.  BP yesterday 645 AM 181/95  After he took his meds 162/92.    Today 720AM 145/87  9AM 142/72 10AM 172/100.

## 2015-02-12 ENCOUNTER — Telehealth: Payer: Self-pay | Admitting: Family Medicine

## 2015-02-12 NOTE — Telephone Encounter (Signed)
See scanned document for BP readings - Per [facility] BP is too high goal is less then 140/90. Increase Toprol back to 100 mg qd - pt is aware and will increase. Pt does states that his dizziness is much better.

## 2015-02-13 NOTE — Telephone Encounter (Signed)
Pt aware.

## 2015-02-13 NOTE — Telephone Encounter (Signed)
If dizziness worsens after we increase toprol, we may need to change his toprol to something different.Marland Kitchen

## 2015-02-18 ENCOUNTER — Ambulatory Visit: Payer: Medicare Other | Attending: Family Medicine | Admitting: Rehabilitative and Restorative Service Providers"

## 2015-02-18 ENCOUNTER — Other Ambulatory Visit (HOSPITAL_BASED_OUTPATIENT_CLINIC_OR_DEPARTMENT_OTHER): Payer: Medicare Other

## 2015-02-18 ENCOUNTER — Encounter: Payer: Self-pay | Admitting: Hematology and Oncology

## 2015-02-18 ENCOUNTER — Telehealth: Payer: Self-pay | Admitting: *Deleted

## 2015-02-18 ENCOUNTER — Ambulatory Visit (HOSPITAL_BASED_OUTPATIENT_CLINIC_OR_DEPARTMENT_OTHER): Payer: Medicare Other | Admitting: Hematology and Oncology

## 2015-02-18 VITALS — BP 145/65 | HR 47 | Temp 98.2°F | Resp 19 | Ht 69.0 in | Wt 215.0 lb

## 2015-02-18 DIAGNOSIS — R269 Unspecified abnormalities of gait and mobility: Secondary | ICD-10-CM | POA: Diagnosis not present

## 2015-02-18 DIAGNOSIS — D6959 Other secondary thrombocytopenia: Secondary | ICD-10-CM

## 2015-02-18 DIAGNOSIS — D509 Iron deficiency anemia, unspecified: Secondary | ICD-10-CM

## 2015-02-18 DIAGNOSIS — D709 Neutropenia, unspecified: Secondary | ICD-10-CM

## 2015-02-18 DIAGNOSIS — D696 Thrombocytopenia, unspecified: Secondary | ICD-10-CM

## 2015-02-18 DIAGNOSIS — D46C Myelodysplastic syndrome with isolated del(5q) chromosomal abnormality: Secondary | ICD-10-CM

## 2015-02-18 DIAGNOSIS — D539 Nutritional anemia, unspecified: Secondary | ICD-10-CM

## 2015-02-18 LAB — CBC & DIFF AND RETIC
BASO%: 0.3 % (ref 0.0–2.0)
Basophils Absolute: 0 10*3/uL (ref 0.0–0.1)
EOS ABS: 0 10*3/uL (ref 0.0–0.5)
EOS%: 0.9 % (ref 0.0–7.0)
HCT: 34.7 % — ABNORMAL LOW (ref 38.4–49.9)
HEMOGLOBIN: 10.7 g/dL — AB (ref 13.0–17.1)
Immature Retic Fract: 20.2 % — ABNORMAL HIGH (ref 3.00–10.60)
LYMPH#: 2.3 10*3/uL (ref 0.9–3.3)
LYMPH%: 64.9 % — ABNORMAL HIGH (ref 14.0–49.0)
MCH: 24.1 pg — AB (ref 27.2–33.4)
MCHC: 30.8 g/dL — AB (ref 32.0–36.0)
MCV: 78.2 fL — ABNORMAL LOW (ref 79.3–98.0)
MONO#: 0.3 10*3/uL (ref 0.1–0.9)
MONO%: 9.2 % (ref 0.0–14.0)
NEUT%: 24.7 % — ABNORMAL LOW (ref 39.0–75.0)
NEUTROS ABS: 0.9 10*3/uL — AB (ref 1.5–6.5)
Platelets: 40 10*3/uL — ABNORMAL LOW (ref 140–400)
RBC: 4.44 10*6/uL (ref 4.20–5.82)
RDW: 27.4 % — AB (ref 11.0–14.6)
RETIC %: 1.55 % (ref 0.80–1.80)
Retic Ct Abs: 68.82 10*3/uL (ref 34.80–93.90)
WBC: 3.5 10*3/uL — AB (ref 4.0–10.3)

## 2015-02-18 LAB — MORPHOLOGY: PLT EST: DECREASED

## 2015-02-18 NOTE — Progress Notes (Signed)
[facility] OFFICE PROGRESS NOTE  Patient Care Team: [name], MD as PCP - General (Family Medicine) Heath Lark, MD as Consulting Physician (Hematology and Oncology)  SUMMARY OF ONCOLOGIC HISTORY: Oncology History   Calculated R-IPSS score at low risk     MDS (myelodysplastic syndrome) with 5q deletion   11/07/2014 Bone Marrow Biopsy Accession: [id number] marrow biopsy showed myelodysplastic syndrome with 5q deletion.    INTERVAL HISTORY: Please see below for problem oriented charting. He returns for further follow-up. He had easy bruising. The patient denies any recent signs or symptoms of bleeding such as spontaneous epistaxis, hematuria or hematochezia. He had one bout of infection treated with antibiotics, prescribed by primary care doctor 2 months ago. He denies recent fevers or chills.  REVIEW OF SYSTEMS:   Constitutional: Denies fevers, chills or abnormal weight loss Eyes: Denies blurriness of vision Ears, nose, mouth, throat, and face: Denies mucositis or sore throat Respiratory: Denies cough, dyspnea or wheezes Cardiovascular: Denies palpitation, chest discomfort or lower extremity swelling Gastrointestinal:  Denies nausea, heartburn or change in bowel habits Skin: Denies abnormal skin rashes Lymphatics: Denies new lymphadenopathy  Neurological:Denies numbness, tingling or new weaknesses Behavioral/Psych: Mood is stable, no new changes  All other systems were reviewed with the patient and are negative.  I have reviewed the past medical history, past surgical history, social history and family history with the patient and they are unchanged from previous note.  ALLERGIES:  is allergic to glucophage; zetia; fenofibrate; and niacin-lovastatin er.  MEDICATIONS:  Current Outpatient Prescriptions  Medication Sig Dispense Refill  . acetaminophen (TYLENOL) 500 MG tablet Take 500 mg by mouth every 6 (six) hours as needed for pain.    Marland Kitchen atorvastatin  (LIPITOR) 10 MG tablet TAKE 1/2 TABLET BY MOUTH DAILY 30 tablet 5  . clopidogrel (PLAVIX) 75 MG tablet TAKE 1 TABLET BY MOUTH DAILY WITH BREAKFAST. 90 tablet 1  . cyanocobalamin (,VITAMIN B-12,) 1000 MCG/ML injection Inject 1,000 mcg into the muscle every 30 (thirty) days.     Marland Kitchen HYDROcodone-acetaminophen (NORCO) 5-325 MG per tablet Take 1 tablet by mouth every 6 (six) hours as needed for moderate pain. 30 tablet 0  . LANTUS SOLOSTAR 100 UNIT/ML Solostar Pen INJECT 25 UNITS INTO THE SKIN AT BEDTIME. (Patient taking differently: INJECT 16 UNITS INTO THE SKIN in AM) 15 pen 5  . metoprolol succinate (TOPROL-XL) 100 MG 24 hr tablet TAKE 1 TABLET DAILY WITH A MEAL OR IMMEDIATELY FOLLOWING A MEAL 90 tablet 3  . pioglitazone (ACTOS) 30 MG tablet TAKE 1 TABLET EVERY DAY 90 tablet 1  . quinapril (ACCUPRIL) 20 MG tablet Take 20 mg by mouth daily.    . Tamsulosin HCl (FLOMAX) 0.4 MG CAPS Take 0.4 mg by mouth daily.      . [DISCONTINUED] cloNIDine (CATAPRES) 0.1 MG tablet Take 0.1 mg by mouth 2 (two) times daily.       No current facility-administered medications for this visit.    PHYSICAL EXAMINATION: ECOG PERFORMANCE STATUS: 1 - Symptomatic but completely ambulatory  Filed Vitals:   02/18/15 1311  BP: 145/65  Pulse: 47  Temp: 98.2 F (36.8 C)  Resp: 19   Filed Weights   02/18/15 1311  Weight: 215 lb (97.523 kg)    GENERAL:alert, no distress and comfortable SKIN: skin color, texture, turgor are normal, no rashes or significant lesions. Noticed skin bruises EYES: normal, Conjunctiva are pink and non-injected, sclera clear OROPHARYNX:no exudate, no erythema and lips, buccal mucosa, and tongue normal  NECK: supple, thyroid normal size, non-tender, without nodularity LYMPH:  no palpable lymphadenopathy in the cervical, axillary or inguinal LUNGS: clear to auscultation and percussion with normal breathing effort HEART: regular rate & rhythm and no murmurs and no lower extremity  edema ABDOMEN:abdomen soft, non-tender and normal bowel sounds Musculoskeletal:no cyanosis of digits and no clubbing  NEURO: alert & oriented x 3 with fluent speech, no focal motor/sensory deficits  LABORATORY DATA:  I have reviewed the data as listed    Component Value Date/Time   NA 142 01/24/2015 1108   NA 142 04/19/2014 1218   K 4.4 01/24/2015 1108   K 4.1 04/19/2014 1218   CL 108 01/24/2015 1108   CL 107 09/27/2012 0940   CO2 26 01/24/2015 1108   CO2 25 04/19/2014 1218   GLUCOSE 109* 01/24/2015 1108   GLUCOSE 118 04/19/2014 1218   GLUCOSE 89 09/27/2012 0940   BUN 24 01/24/2015 1108   BUN 26.4* 04/19/2014 1218   CREATININE 1.59* 01/24/2015 1108   CREATININE 1.9* 04/19/2014 1218   CREATININE 1.64* 09/10/2011 1204   CALCIUM 8.8 01/24/2015 1108   CALCIUM 9.1 04/19/2014 1218   PROT 6.5 01/24/2015 1108   PROT 6.6 04/19/2014 1218   ALBUMIN 3.5* 01/24/2015 1108   ALBUMIN 3.5 04/19/2014 1218   AST 11 01/24/2015 1108   AST 14 04/19/2014 1218   ALT 9 01/24/2015 1108   ALT 14 04/19/2014 1218   ALKPHOS 59 01/24/2015 1108   ALKPHOS 71 04/19/2014 1218   BILITOT 0.3 01/24/2015 1108   BILITOT 0.42 04/19/2014 1218   GFRNONAA 40* 01/24/2015 1108   GFRNONAA 51* 01/19/2011 0605   GFRAA 46* 01/24/2015 1108   GFRAA >60 01/19/2011 0605    No results found for: SPEP, UPEP  Lab Results  Component Value Date   WBC 3.5* 02/18/2015   NEUTROABS 0.9* 02/18/2015   HGB 10.7* 02/18/2015   HCT 34.7* 02/18/2015   MCV 78.2* 02/18/2015   PLT 40* 02/18/2015      Chemistry      Component Value Date/Time   NA 142 01/24/2015 1108   NA 142 04/19/2014 1218   K 4.4 01/24/2015 1108   K 4.1 04/19/2014 1218   CL 108 01/24/2015 1108   CL 107 09/27/2012 0940   CO2 26 01/24/2015 1108   CO2 25 04/19/2014 1218   BUN 24 01/24/2015 1108   BUN 26.4* 04/19/2014 1218   CREATININE 1.59* 01/24/2015 1108   CREATININE 1.9* 04/19/2014 1218   CREATININE 1.64* 09/10/2011 1204      Component Value  Date/Time   CALCIUM 8.8 01/24/2015 1108   CALCIUM 9.1 04/19/2014 1218   ALKPHOS 59 01/24/2015 1108   ALKPHOS 71 04/19/2014 1218   AST 11 01/24/2015 1108   AST 14 04/19/2014 1218   ALT 9 01/24/2015 1108   ALT 14 04/19/2014 1218   BILITOT 0.3 01/24/2015 1108   BILITOT 0.42 04/19/2014 1218      ASSESSMENT & PLAN:  MDS (myelodysplastic syndrome) 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 The patient is currently not symptomatic. However, I'm concerned about recent need for antibiotics therapy. He is neutropenic. He is also getting more thrombocytopenic. We discussed the risks, benefit, side effects of the use of Revlimid for 5q minus myelodysplastic syndrome.   Deficiency anemia At first glance, the microcytic anemia was thought to be due  to iron deficiency anemia. I recommended GI consult However, iron studies appeared adequate Bone marrow aspirate and biopsy confirm myelodysplastic syndrome He is not symptomatic from anemia and I recommend observation without the need for transfusion now    Thrombocytopenia This is chronic related to myelodysplastic syndrome.  PET scan did not show evidence of splenomegaly He is currently getting vitamin B-12 replacement therapy.  He is not symptomatic apart from easy bruising. I'm concerned about increased risk of bleeding as the platelet count is below 50,000. Again, I have a long discussion with patient and family members regarding prescription Revlimid and he would think about it.  Neutropenia The patient had one episode of infection 2 months ago requiring antibiotics. He is still neutropenic from his untreated myelodysplastic syndrome. We discussed about treatment options and consideration to start Revlimid.   No orders of the defined types were placed in this encounter.    All questions were answered. The patient knows to call the clinic with any problems, questions or concerns. No barriers to learning was detected. I spent 25 minutes counseling the patient face to face. The total time spent in the appointment was 40 minutes and more than 50% was on counseling and review of test results     [facility], [place], MD 02/18/2015 3:02 PM

## 2015-02-18 NOTE — Assessment & Plan Note (Signed)
This is chronic related to myelodysplastic syndrome.  PET scan did not show evidence of splenomegaly He is currently getting vitamin B-12 replacement therapy.  He is not symptomatic apart from easy bruising. I'm concerned about increased risk of bleeding as the platelet count is below 50,000. Again, I have a long discussion with patient and family members regarding prescription Revlimid and he would think about it.

## 2015-02-18 NOTE — Assessment & Plan Note (Signed)
At first glance, the microcytic anemia was thought to be due to iron deficiency anemia. I recommended GI consult However, iron studies appeared adequate Bone marrow aspirate and biopsy confirm myelodysplastic syndrome He is not symptomatic from anemia and I recommend observation without the need for transfusion now

## 2015-02-18 NOTE — Assessment & Plan Note (Signed)
The patient had one episode of infection 2 months ago requiring antibiotics. He is still neutropenic from his untreated myelodysplastic syndrome. We discussed about treatment options and consideration to start Revlimid.

## 2015-02-18 NOTE — Telephone Encounter (Signed)
Patient's daughter called asking to speak with [name].  "He was seen today.  We've made a decision."  Call transferred to collaborative nurse.

## 2015-02-18 NOTE — Assessment & Plan Note (Addendum)
I had a long discussion with the patient and family. 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 The patient is currently not symptomatic. However, I'm concerned about recent need for antibiotics therapy. He is neutropenic. He is also getting more thrombocytopenic. We discussed the risks, benefit, side effects of the use of Revlimid for 5q minus myelodysplastic syndrome.

## 2015-02-19 ENCOUNTER — Other Ambulatory Visit: Payer: Self-pay | Admitting: *Deleted

## 2015-02-19 ENCOUNTER — Other Ambulatory Visit: Payer: Self-pay | Admitting: Hematology and Oncology

## 2015-02-19 DIAGNOSIS — D46C Myelodysplastic syndrome with isolated del(5q) chromosomal abnormality: Secondary | ICD-10-CM

## 2015-02-19 MED ORDER — LENALIDOMIDE 2.5 MG PO CAPS: 2.5000 mg | ORAL_CAPSULE | Freq: Every day | ORAL | Status: DC

## 2015-02-19 NOTE — Therapy (Signed)
[facility] [address] [place], [place], [address] Phone: [phone]   Fax:  [phone]  Physical Therapy Evaluation  Patient Details  Name: Ricardo Watkins MRN: [id number] Date of Birth: April 25, 1931 Referring Provider:  [name], MD  Encounter Date: 02/18/2015      PT End of Session - 02/19/15 1033    Visit Number 1   Number of Visits 16   Date for PT Re-Evaluation 04/20/15   Authorization Type G code every 10th visit   PT Start Time 1108   PT Stop Time 1150   PT Time Calculation (min) 42 min   Equipment Utilized During Treatment Gait belt   Activity Tolerance Patient tolerated treatment well   Behavior During Therapy [facility] for tasks assessed/performed      Past Medical History  Diagnosis Date  . PVD (peripheral vascular disease)     rt renal artery stent  . CAD (coronary artery disease)     30% LAD Stenosis, 70% ramus intermedius stenosis, treated with PTCA and angioplasty by Dr Albertine Patricia 2004  . DM (diabetes mellitus)   . Dyslipidemia   . Renal insufficiency   . Thrombocytopenia   . Hypertension   . Vitamin D deficiency   . Non Hodgkin's lymphoma   . Inguinal hernia     right  . Adenomatous colon polyp   . Cataract     right eye  . Hyperlipidemia   . Prostate cancer     seed implants no recurrence  . 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 Prostate ca 09/10/2011    Gleason 3+3 R, 3+4 L lobe May 2007 Rx Radioactive seed implants Dr [name]  . DVT (deep venous thrombosis)     secondary to surgery  . UTI (urinary tract infection)   . 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 Deficiency anemia 04/19/2014  . Diverticulosis     Past Surgical History  Procedure Laterality Date  . Cystourethroscopy     ROBOTIC ARM NUCLETRON SEED IMPLANTATION OF PROSTATE  . Exploratory laparotomy      For evaluation of lymphoma  . Colonoscopy    . Appendectomy      patient ?    There were no vitals filed for this visit.  Visit Diagnosis:  Abnormality of gait      Subjective Assessment - 02/18/15 1100    Subjective The patient reports he has had dizziness and been "falling around".  He reports insidious onset with dizziness occurring when rising from seated position.  He describes it as a spinning sensation.  He does not feel like he has passed out during falls.  All 3 falls occurred after rising from a chair.     Pertinent History 2 or 3 falls with "one bad one" in which he hit his head   Patient Stated Goals Decrease dizziness.     Currently in Pain? No/denies            [facility] PT Assessment - 02/18/15 1103    Assessment   Medical Diagnosis dizziness and falls   Onset Date/Surgical Date --  07/2014   Prior Therapy none   Precautions   Precautions Fall   Balance Screen   Has the patient fallen in the past 6 months  Yes   How many times? 2   Has the patient had a decrease in activity level because of a fear of falling?  Yes   Is the patient reluctant to leave their home because of a fear of falling?  Yes   [place] residence   Living Arrangements Spouse/significant other   Type of [place] to enter;Ramped entrance  Added ramp a month ago due to imbalance   Entrance Stairs-Number of Steps --  6   Entrance Stairs-Rails Can reach both   Home Layout One level   [place] - single point   Prior Function   Level of Independence Independent with gait;Independent with household mobility without device   ROM / Strength   AROM / PROM / Strength AROM;Strength   Ambulation/Gait   Ambulation/Gait Yes   Ambulation/Gait Assistance 4: Min guard   Ambulation Distance (Feet) --  150    Assistive device Straight cane   Gait  Pattern Trunk flexed;Narrow base of support;Shuffle;Decreased stride length   Ambulation Surface Level;Indoor   Gait velocity 1.89 ft/sec   Standardized Balance Assessment   Standardized Balance Assessment Berg Balance Test   Berg Balance Test   Sit to Stand Able to stand without using hands and stabilize independently   Standing Unsupported Able to stand safely 2 minutes   Sitting with Back Unsupported but Feet Supported on Floor or Stool Able to sit safely and securely 2 minutes   Stand to Sit Sits safely with minimal use of hands   Transfers Able to transfer safely, minor use of hands   Standing Unsupported with Eyes Closed Able to stand 10 seconds safely   Standing Ubsupported with Feet Together Able to place feet together independently and stand 1 minute safely   From Standing, Reach Forward with Outstretched Arm Can reach forward >12 cm safely (5")   From Standing Position, Pick up Object from Floor Able to pick up shoe safely and easily   From Standing Position, Turn to Look Behind Over each Shoulder Turn sideways only but maintains balance   Turn 360 Degrees Needs close supervision or verbal cueing   Standing Unsupported, Alternately Place Feet on Step/Stool Able to complete 4 steps without aid or supervision   Standing Unsupported, One Foot in Front Able to plae foot ahead of the other independently and hold 30 seconds   Standing on One Leg Tries to lift leg/unable to hold 3 seconds but remains standing independently   Total Score 44            Vestibular Assessment - 02/18/15 1139    Orthostatics   BP supine (x 5 minutes) 147/78 mmHg   HR supine (x 5 minutes) 71   BP standing (after 1 minute) 143/73 mmHg  patient reports 4/10 symptoms   HR standing (after 1 minute) 79   BP standing (after 3 minutes) 151/78 mmHg   HR standing (after 3 minutes) 78     Patient performed sit<>sidelying without dizziness or nystagmus noted lying down and 2/10 "dizziness" when returning to  seated position.        PT Short Term Goals - 02/19/15 1034    PT SHORT TERM GOAL #1   Title The patient will perform HEP with family assist [facility] program for fall reduction).   Baseline Target date 03/21/2015   Time 4   Period Weeks   PT SHORT TERM GOAL #2   Title The patient  will improve Berg balance score from 44/56 up to > or equal to 48/56 to demo decreasing risk for falls.   Baseline Target date 03/21/2015   Time 4   Period Weeks   PT SHORT TERM GOAL #3   Title The patient will improve gait speed from 1.89 ft/sec to > or equal to 2.3 ft/sec to demo improving functional mobility.   Baseline Target date 03/21/2015   Time 4   Period Weeks   PT SHORT TERM GOAL #4   Title The patient will verbalize understanding of fall prevention/home safety.   Baseline Target date 03/21/2015   Time 4   Period Weeks           PT Long Term Goals - 02/19/15 1055    PT LONG TERM GOAL #1   Title The patient will subjectively report dizziness < 2/10 when moving from sit>standing (baseline 4/10).   Baseline Target date 04/20/2015   Time 8   Period Weeks   PT LONG TERM GOAL #2   Title The patient will improve gait speed from 1.89 ft/sec  to > or equal to 2.6 ft/sec to transition to full community ambulator classification of gait.   Baseline Target date 04/20/2015   Time 8   Period Weeks   PT LONG TERM GOAL #3   Title The patient will improve Berg from 44/56 to > or equal to 50/56 to demo improving functional balance.   Baseline Target date 04/20/2015   Time 8   Period Weeks   PT LONG TERM GOAL #4   Title The patient will be indep with progression of HEP.   Baseline Target date 04/20/2015   Time 8   Period Weeks               Plan - 02/19/15 1056    Clinical Impression Statement The patient is an 81 yo male presenting to OP PT with multifactorial fall risk including gait instability, decreased coordination, polypharmacy, h/o falls, visual changes (son reports he has had  diminished vision).  The patient is at a fall risk per Merrilee Jansky balance score.  His dizziness did not appear to correlate with hypotension today (orthostatic measurements taken), and PT to continue to assess as needed.   Pt will benefit from skilled therapeutic intervention in order to improve on the following deficits Abnormal gait;Decreased mobility;Difficulty walking;Dizziness;Decreased activity tolerance;Decreased balance;Impaired flexibility;Postural dysfunction   Rehab Potential Good   PT Frequency 2x / week   PT Duration 8 weeks   PT Treatment/Interventions Balance training;Neuromuscular re-education;Gait training;Patient/family education;Functional mobility training;Stair training;Therapeutic exercise;Therapeutic activities;Vestibular   PT Next Visit Plan Begin teaching [place] for HEP, gait training, assess dizziness as indicated.   Consulted and Agree with Plan of Care Patient         Problem List Patient Active Problem List   Diagnosis Date Noted  . Neutropenia 02/18/2015  . MDS (myelodysplastic syndrome) with 5q deletion 11/20/2014  . Deficiency anemia 04/19/2014  . Thrombocytopenia 04/19/2014  . Vitamin B12 deficiency 04/19/2014  . Chronic renal failure, stage 3 (moderate) 04/19/2014  . Macrocytic anemia 03/20/2013  . Monocytosis 03/20/2013  . Thrombotic stroke 09/10/2011  . Prostate ca 09/10/2011  . Inguinal hernia   . Colon polyps   . CAD (coronary artery disease) 08/26/2010  . HTN (hypertension) 08/26/2010  . Murmur 08/26/2010  . MURMUR 08/26/2010  . Non-Hodgkin lymphoma 08/25/2010  . DM 08/25/2010  . DYSLIPIDEMIA 08/25/2010  . THROMBOCYTOPENIA 08/25/2010  . Essential hypertension 08/25/2010  . Coronary atherosclerosis  08/25/2010  . PVD 08/25/2010  . RENAL INSUFFICIENCY 08/25/2010    Korbin Mapps, PT 02/19/2015, 3:52 PM  [facility] [address] [place], [place], [address] Phone: [phone]    Fax:  [phone]

## 2015-02-19 NOTE — Telephone Encounter (Signed)
Pt's wife informs pt would like to try Revlimid as recommended by [name].  Pt had signed consent forms yesterday and enrolled in [place] [facility] program.  Auth [id number].  New Rx given to Raquel in managed care dept for prior Auth.

## 2015-02-20 ENCOUNTER — Encounter: Payer: Self-pay | Admitting: Hematology and Oncology

## 2015-02-20 NOTE — Progress Notes (Signed)
I faxed req for prior auth revlimid to [employer]

## 2015-02-21 ENCOUNTER — Telehealth: Payer: Self-pay | Admitting: *Deleted

## 2015-02-21 NOTE — Telephone Encounter (Signed)
Ricardo Watkins with [employer] called.  "Under patient;s insurance Revlimid requires Prior Authorization.  Our clinical team will initiate this on your behalf.  Please call me at [phone] id any questions."

## 2015-02-25 ENCOUNTER — Ambulatory Visit (INDEPENDENT_AMBULATORY_CARE_PROVIDER_SITE_OTHER): Payer: Medicare Other | Admitting: Family Medicine

## 2015-02-25 ENCOUNTER — Ambulatory Visit (INDEPENDENT_AMBULATORY_CARE_PROVIDER_SITE_OTHER): Payer: Medicare Other | Admitting: Podiatry

## 2015-02-25 ENCOUNTER — Encounter: Payer: Self-pay | Admitting: Podiatry

## 2015-02-25 DIAGNOSIS — E538 Deficiency of other specified B group vitamins: Secondary | ICD-10-CM | POA: Diagnosis not present

## 2015-02-25 DIAGNOSIS — M79676 Pain in unspecified toe(s): Secondary | ICD-10-CM

## 2015-02-25 DIAGNOSIS — B351 Tinea unguium: Secondary | ICD-10-CM | POA: Diagnosis not present

## 2015-02-25 MED ORDER — CYANOCOBALAMIN 1000 MCG/ML IJ SOLN
1000.0000 ug | Freq: Once | INTRAMUSCULAR | Status: AC
  Administered 2015-02-25: 1000 ug via INTRAMUSCULAR

## 2015-02-25 NOTE — Patient Instructions (Addendum)
Remove the Band-Aid on the third left toe in 1-2 days. Apply topical antibiotic ointment to this area daily until a scab forms  Diabetes and Foot Care Diabetes may cause you to have problems because of poor blood supply (circulation) to your feet and legs. This may cause the skin on your feet to become thinner, break easier, and heal more slowly. Your skin may become dry, and the skin may peel and crack. You may also have nerve damage in your legs and feet causing decreased feeling in them. You may not notice minor injuries to your feet that could lead to infections or more serious problems. Taking care of your feet is one of the most important things you can do for yourself.  HOME CARE INSTRUCTIONS  Wear shoes at all times, even in the house. Do not go barefoot. Bare feet are easily injured.  Check your feet daily for blisters, cuts, and redness. If you cannot see the bottom of your feet, use a mirror or ask someone for help.  Wash your feet with warm water (do not use hot water) and mild soap. Then pat your feet and the areas between your toes until they are completely dry. Do not soak your feet as this can dry your skin.  Apply a moisturizing lotion or petroleum jelly (that does not contain alcohol and is unscented) to the skin on your feet and to dry, brittle toenails. Do not apply lotion between your toes.  Trim your toenails straight across. Do not dig under them or around the cuticle. File the edges of your nails with an emery board or nail file.  Do not cut corns or calluses or try to remove them with medicine.  Wear clean socks or stockings every day. Make sure they are not too tight. Do not wear knee-high stockings since they may decrease blood flow to your legs.  Wear shoes that fit properly and have enough cushioning. To break in new shoes, wear them for just a few hours a day. This prevents you from injuring your feet. Always look in your shoes before you put them on to be sure there  are no objects inside.  Do not cross your legs. This may decrease the blood flow to your feet.  If you find a minor scrape, cut, or break in the skin on your feet, keep it and the skin around it clean and dry. These areas may be cleansed with mild soap and water. Do not cleanse the area with peroxide, alcohol, or iodine.  When you remove an adhesive bandage, be sure not to damage the skin around it.  If you have a wound, look at it several times a day to make sure it is healing.  Do not use heating pads or hot water bottles. They may burn your skin. If you have lost feeling in your feet or legs, you may not know it is happening until it is too late.  Make sure your health care provider performs a complete foot exam at least annually or more often if you have foot problems. Report any cuts, sores, or bruises to your health care provider immediately. SEEK MEDICAL CARE IF:   You have an injury that is not healing.  You have cuts or breaks in the skin.  You have an ingrown nail.  You notice redness on your legs or feet.  You feel burning or tingling in your legs or feet.  You have pain or cramps in your legs and feet.  Your legs or feet are numb.  Your feet always feel cold. SEEK IMMEDIATE MEDICAL CARE IF:   There is increasing redness, swelling, or pain in or around a wound.  There is a red line that goes up your leg.  Pus is coming from a wound.  You develop a fever or as directed by your health care provider.  You notice a bad smell coming from an ulcer or wound. Document Released: 06/04/2000 Document Revised: 02/07/2013 Document Reviewed: 11/14/2012 [facility] Patient Information 2015 [place], [place]. This information is not intended to replace advice given to you by your health care provider. Make sure you discuss any questions you have with your health care provider.

## 2015-02-25 NOTE — Progress Notes (Signed)
Patient ID: Ricardo Watkins, male   DOB: Mar 28, 1931, 81 y.o.   MRN: [id number]  Subjective: This patient presents for scheduled visit complaining of painful toenails and requests nail debridement  Objective: The toenails are elongated, brittle, discolored, hypertrophic and tender to direct palpation No open skin lesions noted bilaterally  Assessment: Diabetic Symptomatic onychomycoses 6-10  Plan: Debridement toenails 10 mechanical he an electrically. Small bleeding distal third left toe dressed with antibiotic ointment and Band-Aid. Patient advised to remove Band-Aid 1-2 days and continue to apply topical antibiotic ointment daily to wound site heels  Reappoint 3 months

## 2015-02-26 ENCOUNTER — Ambulatory Visit: Payer: Medicare Other | Attending: Family Medicine | Admitting: Rehabilitative and Restorative Service Providers"

## 2015-02-26 ENCOUNTER — Telehealth: Payer: Self-pay | Admitting: *Deleted

## 2015-02-26 DIAGNOSIS — R269 Unspecified abnormalities of gait and mobility: Secondary | ICD-10-CM | POA: Insufficient documentation

## 2015-02-26 NOTE — Therapy (Signed)
[facility] [address] [place], [place], [address] Phone: [phone]   Fax:  [phone]  Physical Therapy Treatment  Patient Details  Name: Ricardo Watkins MRN: [id number] Date of Birth: [birth year] Referring Provider:  [name], MD  Encounter Date: 02/26/2015      PT End of Session - 02/26/15 1055    Visit Number 2   Number of Visits 16   Date for PT Re-Evaluation 04/20/15   Authorization Type G code every 10th visit   PT Start Time 1017   PT Stop Time 1057   PT Time Calculation (min) 40 min   Equipment Utilized During Treatment Gait belt   Activity Tolerance Patient tolerated treatment well   Behavior During Therapy [facility] for tasks assessed/performed      Past Medical History  Diagnosis Date  . PVD (peripheral vascular disease)     rt renal artery stent  . CAD (coronary artery disease)     30% LAD Stenosis, 70% ramus intermedius stenosis, treated with PTCA and angioplasty by Dr Albertine Patricia 2004  . DM (diabetes mellitus)   . Dyslipidemia   . Renal insufficiency   . Thrombocytopenia   . Hypertension   . Vitamin D deficiency   . Non Hodgkin's lymphoma   . Inguinal hernia     right  . Adenomatous colon polyp   . Cataract     right eye  . Hyperlipidemia   . Prostate cancer     seed implants no recurrence  . 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 Prostate ca 09/10/2011    Gleason 3+3 R, 3+4 L lobe May 2007 Rx Radioactive seed implants Dr [name]  . DVT (deep venous thrombosis)     secondary to surgery  . UTI (urinary tract infection)   . 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 Deficiency anemia 04/19/2014  . Diverticulosis     Past Surgical History  Procedure Laterality Date  . Cystourethroscopy       ROBOTIC ARM NUCLETRON SEED IMPLANTATION OF PROSTATE  . Exploratory laparotomy      For evaluation of lymphoma  . Colonoscopy    . Appendectomy      patient ?    There were no vitals filed for this visit.  Visit Diagnosis:  Abnormality of gait      Subjective Assessment - 02/26/15 1020    Subjective The patient reports no falls, but continued dizziness with rising from a chair.   Once he is up for awhile he feels improved.  He has tried some ankle pumps before rising but doesn't note a difference.   Patient Stated Goals Decrease dizziness.     Currently in Pain? No/denies           [facility] Adult PT Treatment/Exercise - 02/26/15 1050    Ambulation/Gait   Ambulation/Gait Yes   Ambulation/Gait Assistance 5: Supervision   Ambulation Distance (Feet) 230 Feet   Assistive device Straight cane   Gait Pattern Decreased stride length   Ambulation Surface Level;Indoor          Balance Exercises - 02/26/15 1027    OTAGO PROGRAM   Head Movements 5 reps;Standing   Neck Movements --  unable to do without tactile cues   Back Extension --  did not perform due to h/o back pain   Trunk Movements Standing;5 reps   Ankle Movements 10 reps;Sitting   Knee Extensor 10 reps   Knee Flexor 10 reps   Hip ABductor 10 reps   Ankle Plantorflexors 20 reps, support   Ankle Dorsiflexors 20 reps, support   Knee Bends 10 reps, support   Backwards Walking Support   Sideways Walking No assistive device   Tandem Walk Support   One Leg Stand 10 seconds, support   Heel Walking Support   Toe Walk Support   Sit to Stand 10 reps, bilateral support           PT Education - 02/26/15 2138    Education provided Yes   Education Details HEP: provided [place] handouts per above exercises.   Person(s) Educated Patient;Spouse   Methods Explanation;Demonstration;Handout   Comprehension Verbalized understanding;Returned demonstration          PT Short Term Goals - 02/19/15 1034    PT SHORT TERM GOAL  #1   Title The patient will perform HEP with family assist [facility] program for fall reduction).   Baseline Target date 03/21/2015   Time 4   Period Weeks   PT SHORT TERM GOAL #2   Title The patient will improve Berg balance score from 44/56 up to > or equal to 48/56 to demo decreasing risk for falls.   Baseline Target date 03/21/2015   Time 4   Period Weeks   PT SHORT TERM GOAL #3   Title The patient will improve gait speed from 1.89 ft/sec to > or equal to 2.3 ft/sec to demo improving functional mobility.   Baseline Target date 03/21/2015   Time 4   Period Weeks   PT SHORT TERM GOAL #4   Title The patient will verbalize understanding of fall prevention/home safety.   Baseline Target date 03/21/2015   Time 4   Period Weeks           PT Long Term Goals - 02/19/15 1055    PT LONG TERM GOAL #1   Title The patient will subjectively report dizziness < 2/10 when moving from sit>standing (baseline 4/10).   Baseline Target date 04/20/2015   Time 8   Period Weeks   PT LONG TERM GOAL #2   Title The patient will improve gait speed from 1.89 ft/sec  to > or equal to 2.6 ft/sec to transition to full community ambulator classification of gait.   Baseline Target date 04/20/2015   Time 8   Period Weeks   PT LONG TERM GOAL #3   Title The patient will improve Berg from 44/56 to > or equal to 50/56 to demo improving functional balance.   Baseline Target date 04/20/2015   Time 8   Period Weeks   PT LONG TERM GOAL #4   Title The patient will be indep with progression of HEP.   Baseline Target date 04/20/2015   Time 8   Period Weeks               Plan - 02/26/15 2139    Clinical Impression Statement The patient tolerates [place] exercises well. Continue working towards [employer] and LTGs to progress mobility.   PT Next Visit Plan Gait training, assess dizziness as indicated   Consulted and Agree with Plan of Care Patient;Family member/caregiver   Family Member Consulted spouse         Problem List Patient Active Problem List   Diagnosis Date Noted  . Neutropenia  02/18/2015  . MDS (myelodysplastic syndrome) with 5q deletion 11/20/2014  . Deficiency anemia 04/19/2014  . Thrombocytopenia 04/19/2014  . Vitamin B12 deficiency 04/19/2014  . Chronic renal failure, stage 3 (moderate) 04/19/2014  . Macrocytic anemia 03/20/2013  . Monocytosis 03/20/2013  . Thrombotic stroke 09/10/2011  . Prostate ca 09/10/2011  . Inguinal hernia   . Colon polyps   . CAD (coronary artery disease) 08/26/2010  . HTN (hypertension) 08/26/2010  . Murmur 08/26/2010  . MURMUR 08/26/2010  . Non-Hodgkin lymphoma 08/25/2010  . DM 08/25/2010  . DYSLIPIDEMIA 08/25/2010  . THROMBOCYTOPENIA 08/25/2010  . Essential hypertension 08/25/2010  . Coronary atherosclerosis 08/25/2010  . PVD 08/25/2010  . RENAL INSUFFICIENCY 08/25/2010    Homar Weinkauf, PT 02/26/2015, 9:40 PM  [facility] [address] [place], [place], [address] Phone: [phone]   Fax:  [phone]

## 2015-02-27 ENCOUNTER — Encounter: Payer: Self-pay | Admitting: Hematology and Oncology

## 2015-02-27 NOTE — Progress Notes (Signed)
I faxed expedited appeal to [employer] for Revlimid denial. Son Jerrye Beavers also left a message to advise they had denied.

## 2015-02-27 NOTE — Telephone Encounter (Signed)
S/w Gabe at [employer] and he said they have correct Diagnosis Code but Revlimid was still denied.  [name] would like to appeal.  I see note from [place] they sent in request to appeal.

## 2015-02-27 NOTE — Telephone Encounter (Signed)
Received message 9/6 from [place] at [employer] that  P.[employer] On Revlimid was denied.   I sent message to our care manager to f/u but have not heard anything back.  I called [employer] today and left VM for Gabe to please call nurse back to start appeal process.  Left Diagnosis code MDS w/ 5 Q deletion for Revlimid.

## 2015-02-28 ENCOUNTER — Other Ambulatory Visit: Payer: Self-pay | Admitting: Family Medicine

## 2015-02-28 ENCOUNTER — Ambulatory Visit: Payer: Medicare Other | Admitting: Rehabilitative and Restorative Service Providers"

## 2015-02-28 NOTE — Progress Notes (Signed)
PT.'S DAUGHTER, [name], CALLED CONCERNING A DENIAL FOR REVLIMID. INFORMED [name] THAT [name] IN MANAGED CARE HAS FAXED AN EXPEDITED APPEAL TO [employer]. [name]'S PHONE NUMBER.

## 2015-03-03 ENCOUNTER — Telehealth: Payer: Self-pay | Admitting: *Deleted

## 2015-03-03 ENCOUNTER — Encounter: Payer: Self-pay | Admitting: Hematology and Oncology

## 2015-03-03 ENCOUNTER — Ambulatory Visit: Payer: Medicare Other | Admitting: Rehabilitative and Restorative Service Providers"

## 2015-03-03 VITALS — BP 175/88 | HR 78

## 2015-03-03 DIAGNOSIS — R269 Unspecified abnormalities of gait and mobility: Secondary | ICD-10-CM

## 2015-03-03 NOTE — Telephone Encounter (Signed)
Call received from [facility] with [facility] Dept.  Calling in reference to form sent by R.T.H. received at 1000 am.  Call transferred to [facility] [phone].  Received voicemail.

## 2015-03-03 NOTE — Progress Notes (Signed)
I placed appeal letter on desk of [name]. I will fax again her [employer].

## 2015-03-03 NOTE — Therapy (Signed)
[facility] [address] [place], [place], [address] Phone: [phone]   Fax:  [phone]  Physical Therapy Treatment  Patient Details  Name: Ricardo Watkins MRN: [id number] Date of Birth: 09-Nov-1930 Referring Provider:  [name], MD  Encounter Date: 03/03/2015      PT End of Session - 03/03/15 0945    Visit Number 3   Number of Visits 16   Date for PT Re-Evaluation 04/20/15   Authorization Type G code every 10th visit   PT Start Time 0930   PT Stop Time 1015   PT Time Calculation (min) 45 min   Equipment Utilized During Treatment Gait belt   Activity Tolerance Patient tolerated treatment well   Behavior During Therapy [facility] for tasks assessed/performed      Past Medical History  Diagnosis Date  . PVD (peripheral vascular disease)     rt renal artery stent  . CAD (coronary artery disease)     30% LAD Stenosis, 70% ramus intermedius stenosis, treated with PTCA and angioplasty by Dr Albertine Patricia 2004  . DM (diabetes mellitus)   . Dyslipidemia   . Renal insufficiency   . Thrombocytopenia   . Hypertension   . Vitamin D deficiency   . Non Hodgkin's lymphoma   . Inguinal hernia     right  . Adenomatous colon polyp   . Cataract     right eye  . Hyperlipidemia   . Prostate cancer     seed implants no recurrence  . 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 Prostate ca 09/10/2011    Gleason 3+3 R, 3+4 L lobe May 2007 Rx Radioactive seed implants Dr [name]  . DVT (deep venous thrombosis)     secondary to surgery  . UTI (urinary tract infection)   . 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 Deficiency anemia 04/19/2014  . Diverticulosis     Past Surgical History  Procedure Laterality Date  . Cystourethroscopy       ROBOTIC ARM NUCLETRON SEED IMPLANTATION OF PROSTATE  . Exploratory laparotomy      For evaluation of lymphoma  . Colonoscopy    . Appendectomy      patient ?    Filed Vitals:   03/03/15 0943  BP: 175/88  Pulse: 78    Visit Diagnosis:  Abnormality of gait      Subjective Assessment - 03/03/15 0930    Subjective The patient reports that he is sore from HEP.  He brought [place] program back.  He notes that his toes have been sore since toenails trimmed last week.  The patient had dizziness described as "lightheaded" this morning and he reports sensation of being pulled backwards.   Patient Stated Goals Decrease dizziness.     Currently in Pain? No/denies            [facility] Adult PT Treatment/Exercise - 03/03/15 0936    Ambulation/Gait   Ambulation/Gait Yes   Ambulation/Gait Assistance 4: Min guard   Ambulation Distance (Feet) --  345, 230 x 3 reps, 115 x 2 reps with cues longer stride   Assistive device --  no device   Gait Pattern --  dec'd heel strike dec'd arm swing   Ambulation Surface Level;Indoor   Pre-Gait Activities  Backwards walking emphasizing longer stride and wider base with min A x 40 feet, emphasizing heel strike and arm swing throughout all gait.   Gait Comments During gait, patient reported his BP was 180/102 this morning + lightheaded.  PT stopped walking to check vitals (175/88).  Rested and BP=152/81.   Self-Care   Self-Care Other Self-Care Comments   Other Self-Care Comments  fall prevention discussed including throw rugs, steps, night lights, etc.   Neuro Re-ed    Neuro Re-ed Details  Rocker board standing with proactive and reactive balance including head turns, eyes closed, reaching tasks.  Alternating LE foot taps and quick taps between 2 steps emphasizing speed and balance.  Marching with CGA for safety.   Exercises   Exercises Other Exercises   Other Exercises  hamstring stretch supine R and L sides, bridges x 10 reps, trunk rotation R<>L                   PT Short Term Goals - 03/03/15 0946    PT SHORT TERM GOAL #1   Title The patient will perform HEP with family assist [facility] program for fall reduction).   Baseline Met on 03/03/2015   Time 4   Period Weeks   Status Achieved   PT SHORT TERM GOAL #2   Title The patient will improve Berg balance score from 44/56 up to > or equal to 48/56 to demo decreasing risk for falls.   Baseline Target date 03/21/2015   Time 4   Period Weeks   PT SHORT TERM GOAL #3   Title The patient will improve gait speed from 1.89 ft/sec to > or equal to 2.3 ft/sec to demo improving functional mobility.   Baseline Target date 03/21/2015   Time 4   Period Weeks   PT SHORT TERM GOAL #4   Title The patient will verbalize understanding of fall prevention/home safety.   Baseline Met on 03/03/2015   Time 4   Period Weeks   Status Achieved           PT Long Term Goals - 02/19/15 1055    PT LONG TERM GOAL #1   Title The patient will subjectively report dizziness < 2/10 when moving from sit>standing (baseline 4/10).   Baseline Target date 04/20/2015   Time 8   Period Weeks   PT LONG TERM GOAL #2   Title The patient will improve gait speed from 1.89 ft/sec  to > or equal to 2.6 ft/sec to transition to full community ambulator classification of gait.   Baseline Target date 04/20/2015   Time 8   Period Weeks   PT LONG TERM GOAL #3   Title The patient will improve Berg from 44/56 to > or equal to 50/56 to demo improving functional balance.   Baseline Target date 04/20/2015   Time 8   Period Weeks   PT LONG TERM GOAL #4   Title The patient will be indep with progression of HEP.   Baseline Target date 04/20/2015   Time 8   Period Weeks               Plan - 03/03/15 1344    Clinical Impression Statement The patient tolerates balance and gait activities well in therapy.  PT emphasizing improved gait mechanis for safety during mobility.   PT Next Visit Plan Gait training,  assess dizziness as indicated, balance   Consulted and Agree with Plan of Care Patient;Family member/caregiver  Problem List Patient Active Problem List   Diagnosis Date Noted  . Neutropenia 02/18/2015  . MDS (myelodysplastic syndrome) with 5q deletion 11/20/2014  . Deficiency anemia 04/19/2014  . Thrombocytopenia 04/19/2014  . Vitamin B12 deficiency 04/19/2014  . Chronic renal failure, stage 3 (moderate) 04/19/2014  . Macrocytic anemia 03/20/2013  . Monocytosis 03/20/2013  . Thrombotic stroke 09/10/2011  . Prostate ca 09/10/2011  . Inguinal hernia   . Colon polyps   . CAD (coronary artery disease) 08/26/2010  . HTN (hypertension) 08/26/2010  . Murmur 08/26/2010  . MURMUR 08/26/2010  . Non-Hodgkin lymphoma 08/25/2010  . DM 08/25/2010  . DYSLIPIDEMIA 08/25/2010  . THROMBOCYTOPENIA 08/25/2010  . Essential hypertension 08/25/2010  . Coronary atherosclerosis 08/25/2010  . PVD 08/25/2010  . RENAL INSUFFICIENCY 08/25/2010    Rocky Rishel, PT 03/03/2015, 1:45 PM  [facility] [address] [place], [place], [address] Phone: [phone]   Fax:  [phone]

## 2015-03-05 ENCOUNTER — Ambulatory Visit: Payer: Medicare Other | Admitting: Rehabilitative and Restorative Service Providers"

## 2015-03-05 VITALS — BP 132/77 | HR 79

## 2015-03-05 DIAGNOSIS — R269 Unspecified abnormalities of gait and mobility: Secondary | ICD-10-CM

## 2015-03-05 NOTE — Therapy (Signed)
[facility] [address] [place], [place], [address] Phone: [phone]   Fax:  [phone]  Physical Therapy Treatment  Patient Details  Name: Ricardo Watkins MRN: [id number] Date of Birth: 05-Aug-1930 Referring Provider:  [name], MD  Encounter Date: 03/05/2015      PT End of Session - 03/05/15 1011    Visit Number 4   Number of Visits 16   Date for PT Re-Evaluation 04/20/15   Authorization Type G code every 10th visit   PT Start Time 0935   PT Stop Time 1015   PT Time Calculation (min) 40 min   Equipment Utilized During Treatment Gait belt   Activity Tolerance Patient tolerated treatment well   Behavior During Therapy [facility] for tasks assessed/performed      Past Medical History  Diagnosis Date  . PVD (peripheral vascular disease)     rt renal artery stent  . CAD (coronary artery disease)     30% LAD Stenosis, 70% ramus intermedius stenosis, treated with PTCA and angioplasty by Dr Albertine Patricia 2004  . DM (diabetes mellitus)   . Dyslipidemia   . Renal insufficiency   . Thrombocytopenia   . Hypertension   . Vitamin D deficiency   . Non Hodgkin's lymphoma   . Inguinal hernia     right  . Adenomatous colon polyp   . Cataract     right eye  . Hyperlipidemia   . Prostate cancer     seed implants no recurrence  . 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 Prostate ca 09/10/2011    Gleason 3+3 R, 3+4 L lobe May 2007 Rx Radioactive seed implants Dr [name]  . DVT (deep venous thrombosis)     secondary to surgery  . UTI (urinary tract infection)   . 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 Deficiency anemia 04/19/2014  . Diverticulosis     Past Surgical History  Procedure Laterality Date  . Cystourethroscopy     ROBOTIC ARM NUCLETRON SEED IMPLANTATION OF PROSTATE  . Exploratory laparotomy      For evaluation of lymphoma  . Colonoscopy    . Appendectomy      patient ?    Filed Vitals:   03/05/15 0942  BP: 132/77  Pulse: 79    Visit Diagnosis:  Abnormality of gait      Subjective Assessment - 03/05/15 0939    Subjective "my blood pressure was a little high this morning."  Patient reports feeling lightheadedness.   Pertinent History Patient reports BP was 150/103 this morning before meds.   Patient Stated Goals Decrease dizziness.     Currently in Pain? No/denies           [facility] Adult PT Treatment/Exercise - 03/05/15 0948    Ambulation/Gait   Ambulation/Gait Yes   Ambulation/Gait Assistance 4: Min guard   Ambulation Distance (Feet) 230 Feet  , 115 feet, 230 feet, x 2 more reps with posture cues   Assistive device --  SPC and without device   Gait Pattern Trunk flexed;Narrow base of support;Shuffle;Decreased stride length   Gait velocity 3.07 ft/sec   Pre-Gait Activities directions changes, start/stops, heel/toe walking with min A for safety x 150 feet x 2 repetitions.   Gait Comments Ambulated  with [facility] emphasizing correct sequencing x 345 feet x 2 reps with CGA and tactile/verbal cues   Neuro Re-ed    Neuro Re-ed Details  Alternating LE foot taps to 6" and 12: surfaces with min A for support and no UE use, sit<>stand emphasizing postural upright when rising ot stand x 5 repetitions, standing marching in parallel bars with CGA, seated trunk rotation and reaching with elongation x 5 reps to each side, rocker board with reaching,/head turns/eyes closed with min A for safety.           PT Short Term Goals - 03/05/15 1011    PT SHORT TERM GOAL #1   Title The patient will perform HEP with family assist [facility] program for fall reduction).   Baseline Met on 03/03/2015   Time 4   Period Weeks   Status Achieved   PT SHORT TERM GOAL #2   Title The patient will improve Berg balance  score from 44/56 up to > or equal to 48/56 to demo decreasing risk for falls.   Baseline Target date 03/21/2015   Time 4   Period Weeks   PT SHORT TERM GOAL #3   Title The patient will improve gait speed from 1.89 ft/sec to > or equal to 2.3 ft/sec to demo improving functional mobility.   Baseline Met on 03/05/2015 scoring 3.07 ft/sec with SPC modified indep.   Time 4   Period Weeks   Status Achieved   PT SHORT TERM GOAL #4   Title The patient will verbalize understanding of fall prevention/home safety.   Baseline Met on 03/03/2015   Time 4   Period Weeks   Status Achieved           PT Long Term Goals - 02/19/15 1055    PT LONG TERM GOAL #1   Title The patient will subjectively report dizziness < 2/10 when moving from sit>standing (baseline 4/10).   Baseline Target date 04/20/2015   Time 8   Period Weeks   PT LONG TERM GOAL #2   Title The patient will improve gait speed from 1.89 ft/sec  to > or equal to 2.6 ft/sec to transition to full community ambulator classification of gait.   Baseline Target date 04/20/2015   Time 8   Period Weeks   PT LONG TERM GOAL #3   Title The patient will improve Berg from 44/56 to > or equal to 50/56 to demo improving functional balance.   Baseline Target date 04/20/2015   Time 8   Period Weeks   PT LONG TERM GOAL #4   Title The patient will be indep with progression of HEP.   Baseline Target date 04/20/2015   Time 8   Period Weeks               Plan - 03/05/15 2243    Clinical Impression Statement The patient has met 2 STGs.  Continue working towards other STGs/LTGs.   PT Next Visit Plan Gait training, assess dizziness as indicated, balance   Consulted and Agree with Plan of Care Patient;Family member/caregiver   Family Member Consulted spouse        Problem List Patient Active Problem List   Diagnosis Date Noted  . Neutropenia 02/18/2015  . MDS (myelodysplastic syndrome) with 5q deletion 11/20/2014  . Deficiency anemia  04/19/2014  . Thrombocytopenia 04/19/2014  . Vitamin B12 deficiency 04/19/2014  . Chronic renal failure, stage 3 (moderate) 04/19/2014  . Macrocytic anemia 03/20/2013  . Monocytosis 03/20/2013  .  Thrombotic stroke 09/10/2011  . Prostate ca 09/10/2011  . Inguinal hernia   . Colon polyps   . CAD (coronary artery disease) 08/26/2010  . HTN (hypertension) 08/26/2010  . Murmur 08/26/2010  . MURMUR 08/26/2010  . Non-Hodgkin lymphoma 08/25/2010  . DM 08/25/2010  . DYSLIPIDEMIA 08/25/2010  . THROMBOCYTOPENIA 08/25/2010  . Essential hypertension 08/25/2010  . Coronary atherosclerosis 08/25/2010  . PVD 08/25/2010  . RENAL INSUFFICIENCY 08/25/2010    Ronith Berti, PT 03/05/2015, 10:46 PM  [facility] [address] [place], [place], [address] Phone: [phone]   Fax:  [phone]

## 2015-03-07 ENCOUNTER — Encounter: Payer: Self-pay | Admitting: Hematology and Oncology

## 2015-03-07 ENCOUNTER — Encounter: Payer: Self-pay | Admitting: *Deleted

## 2015-03-07 ENCOUNTER — Telehealth: Payer: Self-pay | Admitting: Hematology and Oncology

## 2015-03-07 DIAGNOSIS — D46C Myelodysplastic syndrome with isolated del(5q) chromosomal abnormality: Secondary | ICD-10-CM

## 2015-03-07 DIAGNOSIS — D696 Thrombocytopenia, unspecified: Secondary | ICD-10-CM

## 2015-03-07 MED ORDER — ELTROMBOPAG OLAMINE 50 MG PO TABS: 50.0000 mg | ORAL_TABLET | Freq: Every day | ORAL | Status: DC

## 2015-03-07 NOTE — Progress Notes (Signed)
Faxed promacta to [employer] for poss asst.

## 2015-03-07 NOTE — Progress Notes (Signed)
Script for [employer] given to raquel in managed care for assistance with specialty pharmacy and prior authorization.

## 2015-03-07 NOTE — Telephone Encounter (Signed)
I'm getting insurance denial on the prescription of Revlimid for fear that it could induce further pancytopenia. On review of the current NCCN guidelines, Promacta has been shown to improve platelet counts. I talked to the patient's daughter and she agreed to proceed

## 2015-03-07 NOTE — Progress Notes (Signed)
I recd letter from [employer] and they are not approving Revlimid. See prev notes of appeal. I placed letter on desk of nurse for [name].

## 2015-03-10 ENCOUNTER — Ambulatory Visit: Payer: Medicare Other | Admitting: Rehabilitative and Restorative Service Providers"

## 2015-03-10 VITALS — BP 137/62

## 2015-03-10 DIAGNOSIS — R269 Unspecified abnormalities of gait and mobility: Secondary | ICD-10-CM | POA: Diagnosis not present

## 2015-03-10 NOTE — Therapy (Signed)
[facility] [address] [place], [place], [address] Phone: [phone]   Fax:  [phone]  Physical Therapy Treatment  Patient Details  Name: Ricardo Watkins MRN: [id number] Date of Birth: 03/30/1931 Referring Provider:  [name], MD  Encounter Date: 03/10/2015      PT End of Session - 03/10/15 1429    Visit Number 5   Number of Visits 16   Date for PT Re-Evaluation 04/20/15   Authorization Type G code every 10th visit   PT Start Time 0933   PT Stop Time 1018   PT Time Calculation (min) 45 min   Equipment Utilized During Treatment Gait belt   Activity Tolerance Patient tolerated treatment well   Behavior During Therapy [facility] for tasks assessed/performed      Past Medical History  Diagnosis Date  . PVD (peripheral vascular disease)     rt renal artery stent  . CAD (coronary artery disease)     30% LAD Stenosis, 70% ramus intermedius stenosis, treated with PTCA and angioplasty by Dr Albertine Patricia 2004  . DM (diabetes mellitus)   . Dyslipidemia   . Renal insufficiency   . Thrombocytopenia   . Hypertension   . Vitamin D deficiency   . Non Hodgkin's lymphoma   . Inguinal hernia     right  . Adenomatous colon polyp   . Cataract     right eye  . Hyperlipidemia   . Prostate cancer     seed implants no recurrence  . 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 Prostate ca 09/10/2011    Gleason 3+3 R, 3+4 L lobe May 2007 Rx Radioactive seed implants Dr [name]  . DVT (deep venous thrombosis)     secondary to surgery  . UTI (urinary tract infection)   . 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 Deficiency anemia 04/19/2014  . Diverticulosis     Past Surgical History  Procedure Laterality Date  . Cystourethroscopy       ROBOTIC ARM NUCLETRON SEED IMPLANTATION OF PROSTATE  . Exploratory laparotomy      For evaluation of lymphoma  . Colonoscopy    . Appendectomy      patient ?    Filed Vitals:   03/10/15 0938  BP: 137/62    Visit Diagnosis:  Abnormality of gait      Subjective Assessment - 03/10/15 0933    Subjective The patient feels his balance is improving.  He reports his BP is still running high occasionally.  The patient notes still dizzy when rising after sitting for longer periods.   Patient Stated Goals Decrease dizziness.     Currently in Pain? No/denies            [facility] Adult PT Treatment/Exercise - 03/10/15 0939    Ambulation/Gait   Ambulation/Gait Yes   Ambulation/Gait Assistance 5: Supervision   Ambulation Distance (Feet) --  230 feet x 3 reps with cues on posture, cane sequencing   Assistive device Straight cane   Gait Pattern Trunk flexed;Narrow base of support;Shuffle;Decreased stride length   Standardized Balance Assessment   Standardized Balance Assessment Berg Balance Test   Berg Balance Test   Sit to Stand Able to stand without using hands and stabilize independently   Standing  Unsupported Able to stand safely 2 minutes   Sitting with Back Unsupported but Feet Supported on Floor or Stool Able to sit safely and securely 2 minutes   Stand to Sit Sits safely with minimal use of hands   Transfers Able to transfer safely, minor use of hands   Standing Unsupported with Eyes Closed Able to stand 10 seconds safely   Standing Ubsupported with Feet Together Able to place feet together independently and stand 1 minute safely   From Standing, Reach Forward with Outstretched Arm Can reach confidently >25 cm (10")   From Standing Position, Pick up Object from Floor Able to pick up shoe safely and easily   From Standing Position, Turn to Look Behind Over each Shoulder Looks behind from both sides and weight shifts well   Turn 360 Degrees Able to turn 360 degrees safely in 4  seconds or less   Standing Unsupported, Alternately Place Feet on Step/Stool Able to complete 4 steps without aid or supervision   Standing Unsupported, One Foot in Front Able to plae foot ahead of the other independently and hold 30 seconds   Standing on One Leg Tries to lift leg/unable to hold 3 seconds but remains standing independently   Total Score 50             Balance Exercises - 03/10/15 0954    [place] PROGRAM   Head Movements Standing;5 reps   Trunk Movements Standing;5 reps   Ankle Movements Sitting;10 reps   Knee Extensor 10 reps   Knee Flexor 10 reps   Hip ABductor 10 reps   Ankle Plantorflexors 20 reps, support   Ankle Dorsiflexors 20 reps, support   Knee Bends 10 reps, support   Backwards Walking Support   Sideways Walking No assistive device   Tandem Walk Support   One Leg Stand 10 seconds, support   Heel Walking Support   Toe Walk Support   Sit to Stand 10 reps, no support   Overall OTAGO Comments Patient doing [place] for HEP             PT Short Term Goals - 03/10/15 0946    PT SHORT TERM GOAL #1   Title The patient will perform HEP with family assist [facility] program for fall reduction).   Baseline Met on 03/03/2015   Time 4   Period Weeks   Status Achieved   PT SHORT TERM GOAL #2   Title The patient will improve Berg balance score from 44/56 up to > or equal to 48/56 to demo decreasing risk for falls.   Baseline Pt met on 03/10/2015 scoring 50/56.   Time 4   Period Weeks   Status Achieved   PT SHORT TERM GOAL #3   Title The patient will improve gait speed from 1.89 ft/sec to > or equal to 2.3 ft/sec to demo improving functional mobility.   Baseline Met on 03/05/2015 scoring 3.07 ft/sec with SPC modified indep.   Time 4   Period Weeks   Status Achieved   PT SHORT TERM GOAL #4   Title The patient will verbalize understanding of fall prevention/home safety.   Baseline Met on 03/03/2015   Time 4   Period Weeks   Status Achieved            PT Long Term Goals - 03/10/15 0950    PT LONG TERM GOAL #1   Title The patient will subjectively report dizziness < 2/10 when moving from sit>standing (baseline 4/10).  Baseline Target date 04/20/2015   Time 8   Period Weeks   Status On-going   PT LONG TERM GOAL #2   Title The patient will improve gait speed from 1.89 ft/sec  to > or equal to 2.6 ft/sec to transition to full community ambulator classification of gait.   Baseline Met on 03/10/2015 with gait speed 2.70 ft/sec   Time 8   Period Weeks   Status Achieved   PT LONG TERM GOAL #3   Title The patient will improve Berg from 44/56 to > or equal to 50/56 to demo improving functional balance.   Baseline Met on 03/10/2015 with patient scoring 50/56.   Time 8   Period Weeks   Status Achieved   PT LONG TERM GOAL #4   Title The patient will be indep with progression of HEP.   Baseline Target date 04/20/2015   Time 8   Period Weeks   Status On-going               Plan - 03/10/15 1011    Clinical Impression Statement The patient is meeting STGs and LTGs early.  PT to d/c with HEP next visit.   PT Next Visit Plan community gait, curbs, inclines, steps; floor<>stand transfers for d/c, patient inquires about treadmill walking--will assess safety next visit   Consulted and Agree with Plan of Care Patient;Family member/caregiver   Family Member Consulted spouse        Problem List Patient Active Problem List   Diagnosis Date Noted  . Neutropenia 02/18/2015  . MDS (myelodysplastic syndrome) with 5q deletion 11/20/2014  . Deficiency anemia 04/19/2014  . Thrombocytopenia 04/19/2014  . Vitamin B12 deficiency 04/19/2014  . Chronic renal failure, stage 3 (moderate) 04/19/2014  . Macrocytic anemia 03/20/2013  . Monocytosis 03/20/2013  . Thrombotic stroke 09/10/2011  . Prostate ca 09/10/2011  . Inguinal hernia   . Colon polyps   . CAD (coronary artery disease) 08/26/2010  . HTN (hypertension) 08/26/2010  . Murmur  08/26/2010  . MURMUR 08/26/2010  . Non-Hodgkin lymphoma 08/25/2010  . DM 08/25/2010  . DYSLIPIDEMIA 08/25/2010  . THROMBOCYTOPENIA 08/25/2010  . Essential hypertension 08/25/2010  . Coronary atherosclerosis 08/25/2010  . PVD 08/25/2010  . RENAL INSUFFICIENCY 08/25/2010    WEAVER,CHRISTINA, PT 03/10/2015, 2:33 PM  [facility] [address] [place], [place], [address] Phone: [phone]   Fax:  [phone]

## 2015-03-12 ENCOUNTER — Encounter: Payer: Self-pay | Admitting: Rehabilitative and Restorative Service Providers"

## 2015-03-12 ENCOUNTER — Ambulatory Visit: Payer: Medicare Other | Admitting: Rehabilitative and Restorative Service Providers"

## 2015-03-12 ENCOUNTER — Encounter: Payer: Self-pay | Admitting: Hematology and Oncology

## 2015-03-12 VITALS — BP 154/77 | HR 75

## 2015-03-12 DIAGNOSIS — R269 Unspecified abnormalities of gait and mobility: Secondary | ICD-10-CM | POA: Diagnosis not present

## 2015-03-12 NOTE — Progress Notes (Signed)
I called and left Claiborne Billings a message to proceed with promacta req. We were denied the revlimid so [name] switched. See notes

## 2015-03-12 NOTE — Therapy (Signed)
[facility] [address] [place], [place], [address] Phone: [phone]   Fax:  [phone]  Patient Details  Name: Ricardo Watkins MRN: [id number] Date of Birth: 19-May-1931 Referring Provider:  No ref. provider found  Encounter Date: 03/12/2015  PHYSICAL THERAPY DISCHARGE SUMMARY  Visits from Start of Care: 6   Current functional level related to goals / functional outcomes:     PT Short Term Goals - 03/10/15 0946    PT SHORT TERM GOAL #1   Title The patient will perform HEP with family assist [facility] program for fall reduction).   Baseline Met on 03/03/2015   Time 4   Period Weeks   Status Achieved   PT SHORT TERM GOAL #2   Title The patient will improve Berg balance score from 44/56 up to > or equal to 48/56 to demo decreasing risk for falls.   Baseline Pt met on 03/10/2015 scoring 50/56.   Time 4   Period Weeks   Status Achieved   PT SHORT TERM GOAL #3   Title The patient will improve gait speed from 1.89 ft/sec to > or equal to 2.3 ft/sec to demo improving functional mobility.   Baseline Met on 03/05/2015 scoring 3.07 ft/sec with SPC modified indep.   Time 4   Period Weeks   Status Achieved   PT SHORT TERM GOAL #4   Title The patient will verbalize understanding of fall prevention/home safety.   Baseline Met on 03/03/2015   Time 4   Period Weeks   Status Achieved         PT Long Term Goals - 03/12/15 1009    PT LONG TERM GOAL #1   Title The patient will subjectively report dizziness < 2/10 when moving from sit>standing (baseline 4/10).   Baseline Pt reports 1-2/10 symtpoms.  He reports overall, he feels therapy has helped balance, but not dizziness.   Time 8   Period Weeks   Status Achieved   PT LONG TERM GOAL #2   Title The patient will improve gait speed from 1.89 ft/sec  to > or equal to 2.6 ft/sec to transition to full community ambulator classification of gait.   Baseline Met on 03/10/2015 with gait  speed 2.70 ft/sec   Time 8   Period Weeks   Status Achieved   PT LONG TERM GOAL #3   Title The patient will improve Berg from 44/56 to > or equal to 50/56 to demo improving functional balance.   Baseline Met on 03/10/2015 with patient scoring 50/56.   Time 8   Period Weeks   Status Achieved   PT LONG TERM GOAL #4   Title The patient will be indep with progression of HEP.   Baseline Met on 03/12/2015   Time 8   Period Weeks   Status Achieved        Remaining deficits: Lightheadedness, which may be multi-factorial Decreased high level balance Occasional shuffling steps (worse with feet swollen) during gait   Education / Equipment: HEP, home safety and post d/c exercises.  Plan: Patient agrees to discharge.  Patient goals were met. Patient is being discharged due to meeting the stated rehab goals.  ?????       Thank you for the referral of this patient. Rudell Cobb, MPT   [place] 03/12/2015, 11:23 AM  [facility] [address] [place], [place], [address] Phone: [phone]   Fax:  [phone]

## 2015-03-12 NOTE — Therapy (Signed)
[facility] [address] [place], [place], [address] Phone: [phone]   Fax:  [phone]  Physical Therapy Treatment  Patient Details  Name: Ricardo Watkins MRN: [id number] Date of Birth: [birth year] Referring Rossie Bretado:  [name], MD  Encounter Date: 03/12/2015      PT End of Session - 03/12/15 0942    Visit Number 6   Number of Visits 16   Date for PT Re-Evaluation 04/20/15   Authorization Type G code every 10th visit   PT Start Time 0935   PT Stop Time 1005   PT Time Calculation (min) 30 min   Equipment Utilized During Treatment Gait belt   Activity Tolerance Patient tolerated treatment well   Behavior During Therapy [facility] for tasks assessed/performed      Past Medical History  Diagnosis Date  . PVD (peripheral vascular disease)     rt renal artery stent  . CAD (coronary artery disease)     30% LAD Stenosis, 70% ramus intermedius stenosis, treated with PTCA and angioplasty by Dr Albertine Patricia 2004  . DM (diabetes mellitus)   . Dyslipidemia   . Renal insufficiency   . Thrombocytopenia   . Hypertension   . Vitamin D deficiency   . Non Hodgkin's lymphoma   . Inguinal hernia     right  . Adenomatous colon polyp   . Cataract     right eye  . Hyperlipidemia   . Prostate cancer     seed implants no recurrence  . 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 Prostate ca 09/10/2011    Gleason 3+3 R, 3+4 L lobe May 2007 Rx Radioactive seed implants Dr [name]  . DVT (deep venous thrombosis)     secondary to surgery  . UTI (urinary tract infection)   . 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 Deficiency anemia 04/19/2014  . Diverticulosis     Past Surgical History  Procedure Laterality Date  . Cystourethroscopy     ROBOTIC ARM NUCLETRON SEED IMPLANTATION OF PROSTATE  . Exploratory laparotomy      For evaluation of lymphoma  . Colonoscopy    . Appendectomy      patient ?    Filed Vitals:   03/12/15 0940  BP: 154/77  Pulse: 75    Visit Diagnosis:  Abnormality of gait      Subjective Assessment - 03/12/15 0939    Subjective Dizziness is still present "not too bad today", described as lightheaded sensation.  Patient reports BP high today- took meds.  He forgot his insulin prior to therapy today and reports he will take it as soon as he returns home.   Patient Stated Goals Decrease dizziness.     Currently in Pain? No/denies      Gait: Community ambulation with SPC and supervision on unlevel surfaces including grass, pin[place]raw, unlevel parking lot surfaces (inclined/declined) x 500 feet Gait indoors x 230 feet Treadmill tried per patient and his wife's question of being able to do at home.  Patient ambulated x 3 minutes up to 1.4 mph with UE support and SBA. PT recommended no home use due to continued lightheadedness.  THERAPEUTIC ACTIVITIES: Floor<>stand with demonstration, education and return demo of patient with transfer with SBA for support  SELF CARE/HOME MANAGEMENT: Discussed continuing post d/c [place] program to continue with fall prevention. Educated patient's wife on floor<>stand transfers.       PT Education - 03-14-2015 1008    Education provided Yes   Education Details discussed progression of HEP, fall prevention, floor<>stand, and  not using treadmill due to lightheadedness   Person(s) Educated Patient;Spouse   Methods Explanation;Demonstration   Comprehension Verbalized understanding;Returned demonstration          PT Short Term Goals - 03/10/15 0946    PT SHORT TERM GOAL #1   Title The patient will perform HEP with family assist [facility] program for fall reduction).   Baseline Met on 03/03/2015   Time 4   Period Weeks   Status Achieved   PT SHORT TERM GOAL #2    Title The patient will improve Berg balance score from 44/56 up to > or equal to 48/56 to demo decreasing risk for falls.   Baseline Pt met on 03/10/2015 scoring 50/56.   Time 4   Period Weeks   Status Achieved   PT SHORT TERM GOAL #3   Title The patient will improve gait speed from 1.89 ft/sec to > or equal to 2.3 ft/sec to demo improving functional mobility.   Baseline Met on 03/05/2015 scoring 3.07 ft/sec with SPC modified indep.   Time 4   Period Weeks   Status Achieved   PT SHORT TERM GOAL #4   Title The patient will verbalize understanding of fall prevention/home safety.   Baseline Met on 03/03/2015   Time 4   Period Weeks   Status Achieved           PT Long Term Goals - 03-14-2015 1009    PT LONG TERM GOAL #1   Title The patient will subjectively report dizziness < 2/10 when moving from sit>standing (baseline 4/10).   Baseline Pt reports 1-2/10 symtpoms.  He reports overall, he feels therapy has helped balance, but not dizziness.   Time 8   Period Weeks   Status Achieved   PT LONG TERM GOAL #2   Title The patient will improve gait speed from 1.89 ft/sec  to > or equal to 2.6 ft/sec to transition to full community ambulator classification of gait.   Baseline Met on 03/10/2015 with gait speed 2.70 ft/sec   Time 8   Period Weeks   Status Achieved   PT LONG TERM GOAL #3   Title The patient will improve Berg from 44/56 to > or equal to 50/56 to demo improving functional balance.   Baseline Met on 03/10/2015 with patient scoring 50/56.   Time 8   Period Weeks   Status Achieved   PT LONG TERM GOAL #4   Title The patient will be indep with progression of HEP.   Baseline Met on 03/14/15   Time 8   Period Weeks   Status Achieved               Plan - 03/14/2015 1010    Clinical Impression Statement The patient met all STGs/LTGs.  He continues with dizziness, however further describes as lightheadedness and is noting blood pressure elevated in the morning +other medical  issues that may contribute.   PT Next Visit Plan d/c visit   Consulted and Agree with Plan of Care Patient          G-Codes - 03-14-2015 1011    Functional Assessment Tool Used Berg=50/56   Functional Limitation Mobility: Walking and moving around  Mobility: Walking and Moving Around Goal Status [phone]) At least 1 percent but less than 20 percent impaired, limited or restricted   Mobility: Walking and Moving Around Discharge Status [phone]) At least 1 percent but less than 20 percent impaired, limited or restricted      Problem List Patient Active Problem List   Diagnosis Date Noted  . Neutropenia 02/18/2015  . MDS (myelodysplastic syndrome) with 5q deletion 11/20/2014  . Deficiency anemia 04/19/2014  . Thrombocytopenia 04/19/2014  . Vitamin B12 deficiency 04/19/2014  . Chronic renal failure, stage 3 (moderate) 04/19/2014  . Macrocytic anemia 03/20/2013  . Monocytosis 03/20/2013  . Thrombotic stroke 09/10/2011  . Prostate ca 09/10/2011  . Inguinal hernia   . Colon polyps   . CAD (coronary artery disease) 08/26/2010  . HTN (hypertension) 08/26/2010  . Murmur 08/26/2010  . MURMUR 08/26/2010  . Non-Hodgkin lymphoma 08/25/2010  . DM 08/25/2010  . DYSLIPIDEMIA 08/25/2010  . THROMBOCYTOPENIA 08/25/2010  . Essential hypertension 08/25/2010  . Coronary atherosclerosis 08/25/2010  . PVD 08/25/2010  . RENAL INSUFFICIENCY 08/25/2010    WEAVER,CHRISTINA, PT 03/12/2015, [place] [address] [place], [place], [address] Phone: [phone]   Fax:  [phone]

## 2015-03-14 ENCOUNTER — Ambulatory Visit (INDEPENDENT_AMBULATORY_CARE_PROVIDER_SITE_OTHER): Payer: Medicare Other | Admitting: Family Medicine

## 2015-03-14 ENCOUNTER — Telehealth: Payer: Self-pay | Admitting: *Deleted

## 2015-03-14 ENCOUNTER — Encounter: Payer: Self-pay | Admitting: Family Medicine

## 2015-03-14 VITALS — BP 126/88 | HR 68 | Temp 97.7°F | Resp 20 | Ht 69.0 in | Wt 213.0 lb

## 2015-03-14 DIAGNOSIS — I2510 Atherosclerotic heart disease of native coronary artery without angina pectoris: Secondary | ICD-10-CM | POA: Diagnosis not present

## 2015-03-14 DIAGNOSIS — I10 Essential (primary) hypertension: Secondary | ICD-10-CM

## 2015-03-14 DIAGNOSIS — Z23 Encounter for immunization: Secondary | ICD-10-CM | POA: Diagnosis not present

## 2015-03-14 MED ORDER — CLONIDINE HCL 0.1 MG PO TABS
0.1000 mg | ORAL_TABLET | Freq: Every day | ORAL | Status: DC
Start: 2015-03-14 — End: 2015-04-07

## 2015-03-14 NOTE — Progress Notes (Signed)
Subjective:    Patient ID: Ricardo Watkins, male    DOB: Feb 12, 1931, 81 y.o.   MRN: [id number]  HPI  10/17/14 Over the last 3 weeks, the patient has been developing severe headaches on the right side of his head. He is also having dizziness with standing. He denies any syncope or presyncope however he feels extremely lightheaded whenever he stands up. He has fallen on 2 separate occasions. The second time he struck his occiput. He denies any chest pain, palpitations. He denies any vertigo. Today on examination Dix-Hallpike maneuver is negative. However the headache is starting to worsen. It is constant. He denies any blurry vision. He denies any neurologic deficits. He does have a history of a stroke. He denies any strokelike symptoms. He denies any diplopia. He denies any memory loss or personality changes. However his dizziness with standing is worsening. His blood pressure has been running low recently at home between 101 115/40-50.  At that time, my plan was: I'm concerned that the patient may be getting dizzy and falling due to low blood pressure. Therefore I'm going to have the patient temporarily discontinue amlodipine as well as Flomax. I will recheck him here next week to see if his symptoms are improving. However given the fact that he has now developed a daily headache along with dizziness and he is recently had his head several times after falls, I will obtain a head CT without contrast to evaluate for subdural hematoma versus other intracranial pathology. Given his chronic kidney disease, I will not be able to use contrast. However we should be able to see any sign of bleeding without contrast. Patient's blood pressure is low. I will discontinue amlodipine. While he is here with a check a hemoglobin A1c along with a urine microalbumin to monitor his insulin-dependent diabetes mellitus. We may need to decrease his medication if his blood sugar is too tightly controlled. Given his age, and ideal  hemoglobin A1c will be between 7 and 8. I will also check a fasting lipid panel. Goal LDL cholesterol is less than 70 given his history of coronary artery disease as well as stroke. Patient is also having some chronic low back pain. This back pain has worsened after his fall yesterday. There is no tenderness to palpation over the spinous processes of the lumbar spine. The pain is more muscular in nature and Tylenol is not relieving his pain. Because of his chronic kidney disease he cannot take NSAIDs. Therefore I did give the patient hydrocodone/acetaminophen 5/325 one by mouth every 6 hours when necessary pain  10/24/14 Patient states he is doing much better. He has not fallen anymore. He is no longer having dizzy spells. Patient did have to resume the Flomax as he was having urinary retention without it. However he continues to refrain from using amlodipine. Unfortunately he saw his hematologist recently. His myelodysplasia is worsening. They're concerned about a reactivation of his non-Hodgkin's lymphoma. This scheduled the patient for a PET scan.  At that time, my plan was: Patient's dizziness seems to be related to his blood pressure. He feels much better since discontinuing amlodipine. His head CT showed no stroke, intracranial hemorrhage, or brain tumor. Therefore no further follow-up is necessary for this condition. I will eagerly await the results of his PET scan  01/24/15 Patient continues to complain of dizziness. He complains of poor balance with standing. His blood pressure at home has been low at times between 110 and 120/60-70. He is also on numerous  medications that can also affect his balance including oxybutynin, Flomax, and occasional pain medication. On examination today he has +1 pitting edema in both legs and faint bibasilar crackles. The patient is on Actos for diabetes.  At that time, my plan was: I have asked the patient to discontinue oxybutynin. I would like him to decrease Toprol-XL  to 50 mg by mouth daily. I would like to recheck in 2 weeks to see if his dizziness has improved. I will also schedule the patient for physical therapy to try to improve his strength, his coordination, and his balance. I will check a hemoglobin A1c and as long as his hemoglobin A1c is less than 8, I will discontinue Actos due to the swelling.  03/14/15 Here for follow up.  Patient's blood pressure is typically 160-170 over 90s in  Night and early morning.  Throughout the day his blood pressure will drop after he takes his medication. He does feel better and less dizzy with the higher blood pressures. Unfortunately his platelet count has fallen to 40. He is working with the hematologist to treat this. He is on Plavix for coronary artery disease. However given his history of renal insufficiency, thrombocytopenia, I am very concerned about him having uncontrolled bleeding requiring hospitalization Past Medical History  Diagnosis Date  . PVD (peripheral vascular disease)     rt renal artery stent  . CAD (coronary artery disease)     30% LAD Stenosis, 70% ramus intermedius stenosis, treated with PTCA and angioplasty by Dr Albertine Patricia 2004  . DM (diabetes mellitus)   . Dyslipidemia   . Renal insufficiency   . Thrombocytopenia   . Hypertension   . Vitamin D deficiency   . Non Hodgkin's lymphoma   . Inguinal hernia     right  . Adenomatous colon polyp   . Cataract     right eye  . Hyperlipidemia   . Prostate cancer     seed implants no recurrence  . 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 Prostate ca 09/10/2011    Gleason 3+3 R, 3+4 L lobe May 2007 Rx Radioactive seed implants Dr [name]  . DVT (deep venous thrombosis)     secondary to surgery  . UTI (urinary tract infection)   . 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 Monocytosis 03/20/2013    Suspect chemo  related MDS  . Deficiency anemia 04/19/2014  . Diverticulosis    Past Surgical History  Procedure Laterality Date  . Cystourethroscopy      ROBOTIC ARM NUCLETRON SEED IMPLANTATION OF PROSTATE  . Exploratory laparotomy      For evaluation of lymphoma  . Colonoscopy    . Appendectomy      patient ?   Current Outpatient Prescriptions on File Prior to Visit  Medication Sig Dispense Refill  . acetaminophen (TYLENOL) 500 MG tablet Take 500 mg by mouth every 6 (six) hours as needed for pain.    Marland Kitchen atorvastatin (LIPITOR) 10 MG tablet TAKE 1/2 TABLET BY MOUTH DAILY 30 tablet 5  . clopidogrel (PLAVIX) 75 MG tablet TAKE 1 TABLET BY MOUTH DAILY WITH BREAKFAST. 90 tablet 1  . cyanocobalamin (,VITAMIN B-12,) 1000 MCG/ML injection Inject 1,000 mcg into the muscle every 30 (thirty) days.     Marland Kitchen eltrombopag (PROMACTA) 50 MG tablet Take 1 tablet (50 mg total) by mouth  daily. Take on an empty stomach 1 hour before a meal or 2 hours after 30 tablet 6  . HYDROcodone-acetaminophen (NORCO) 5-325 MG per tablet Take 1 tablet by mouth every 6 (six) hours as needed for moderate pain. 30 tablet 0  . LANTUS SOLOSTAR 100 UNIT/ML Solostar Pen INJECT 25 UNITS INTO THE SKIN AT BEDTIME. 15 pen 3  . lenalidomide (REVLIMID) 2.5 MG capsule Take 1 capsule (2.5 mg total) by mouth daily. 28 capsule 6  . metoprolol succinate (TOPROL-XL) 100 MG 24 hr tablet TAKE 1 TABLET DAILY WITH A MEAL OR IMMEDIATELY FOLLOWING A MEAL 90 tablet 3  . pioglitazone (ACTOS) 30 MG tablet TAKE 1 TABLET EVERY DAY 90 tablet 1  . quinapril (ACCUPRIL) 20 MG tablet Take 20 mg by mouth daily.    . Tamsulosin HCl (FLOMAX) 0.4 MG CAPS Take 0.4 mg by mouth daily.      . [DISCONTINUED] cloNIDine (CATAPRES) 0.1 MG tablet Take 0.1 mg by mouth 2 (two) times daily.       No current facility-administered medications on file prior to visit.   Allergies  Allergen Reactions  . Glucophage [Metformin Hydrochloride]     Chest pain  . Zetia [Ezetimibe]     weakness   . Fenofibrate Rash  . Niacin-Lovastatin Er Rash   Social History   Social History  . Marital Status: Married    Spouse Name: N/A  . Number of Children: 4  . Years of Education: N/A   Occupational History  . retired    Social History Main Topics  . Smoking status: Former Smoker    Types: Cigarettes    Quit date: 06/21/1958  . Smokeless tobacco: Never Used     Comment: Quit 30 years ago. and use to smoke pipe but quit  . Alcohol Use: No  . Drug Use: No  . Sexual Activity:    Partners: Female   Other Topics Concern  . Not on file   Social History Narrative     Review of Systems  All other systems reviewed and are negative.      Objective:   Physical Exam  Constitutional: He is oriented to person, place, and time. He appears well-developed and well-nourished.  HENT:  Nose: Nose normal.  Mouth/Throat: Oropharynx is clear and moist.  Neck: Neck supple.  Cardiovascular: Normal rate, regular rhythm and normal heart sounds.   No murmur heard. Pulmonary/Chest: Effort normal and breath sounds normal. No respiratory distress. He has no wheezes. He has no rales.  Abdominal: Soft. Bowel sounds are normal. He exhibits no distension. There is no tenderness. There is no rebound and no guarding.  Musculoskeletal: He exhibits no edema.  Lymphadenopathy:    He has no cervical adenopathy.  Neurological: He is alert and oriented to person, place, and time. He has normal reflexes. No cranial nerve deficit. He exhibits normal muscle tone. Coordination normal.  Vitals reviewed.         Assessment & Plan:  Benign essential HTN - Plan: cloNIDine (CATAPRES) 0.1 MG tablet  Add clonidine 0.1 mg by mouth daily at bedtime. Recheck blood pressures in 2-3 weeks. Temporarily  Discontinue Plavix due to thrombocytopenia.Marland Kitchen

## 2015-03-14 NOTE — Telephone Encounter (Signed)
Called [facility] to f/u on status of new Rx for Promacta.  S/w Selena who states the medication was d/c'd b/c they got a message from [facility] that pt is switched from Revlimid to [place].  It looks like they accidentally canceled the order for Promacta.   Informed Selena it is the Revlimid that is d/c'd due to not covered by [employer].  Please re order Promacta as soon as possible.  Geraldo Pitter will work on it so they can run it through insurance to see if it is approved yet.  Raquel had sent off for the PA on it earlier this week.  Will f/u next week.

## 2015-03-14 NOTE — Addendum Note (Signed)
Addended by: Shary Decamp B on: 03/14/2015 02:58 PM   Modules accepted: Orders

## 2015-03-18 ENCOUNTER — Telehealth: Payer: Self-pay | Admitting: *Deleted

## 2015-03-18 NOTE — Telephone Encounter (Signed)
This RN spoke with a [facility] representative inquiring where we stand with getting the Promacta prescription filled. Representative stated,"we are still waiting on the diagnosis code and updated clinical notes." Fax these items to [phone]. Then, this RN spoke with [place] office and they had already faxed these items. Raquel went ahead and refaxed it for the second time.

## 2015-03-19 ENCOUNTER — Encounter: Payer: Self-pay | Admitting: Hematology and Oncology

## 2015-03-19 ENCOUNTER — Encounter: Payer: Self-pay | Admitting: *Deleted

## 2015-03-19 NOTE — Progress Notes (Signed)
I placed prior auth form for promacta on desk of dr [name]

## 2015-03-20 ENCOUNTER — Telehealth: Payer: Self-pay | Admitting: *Deleted

## 2015-03-20 NOTE — Telephone Encounter (Signed)
Received call from [facility] states Promacta has been approved.  They will send [place] letter confirming this. I called [facility] pharmacy and s/w Emmons.  She is aware of approval status and states pt's co-pay is over $2,500 per month.  They will reach out to pt and try to apply for co-pay assistance.  They do not need anything else from our office at this time.

## 2015-03-21 ENCOUNTER — Encounter: Payer: Self-pay | Admitting: Hematology and Oncology

## 2015-03-21 NOTE — Progress Notes (Signed)
Per shaun they spoke with the patient this morning about copay and shipment. See prev notes. The patient had left a message also.

## 2015-03-21 NOTE — Progress Notes (Signed)
Per shaun at [facility] left message Ricardo Watkins has been approved. The son of patient left a message also that insurance called them to let them know also

## 2015-03-24 ENCOUNTER — Telehealth: Payer: Self-pay | Admitting: *Deleted

## 2015-03-24 NOTE — Telephone Encounter (Signed)
[name],  "Ricardo Watkins with [employer] [phone]) calling about this adult male initials D.A., D.O.B. 1931-05-29.  Authorization [id number] has expired.  Has he started Revlimid or will he start?  Please call and also provide [place] with the pharmacy name and number."    [phone] voicemail retrieved at [phone], forwarded at [phone]

## 2015-03-25 ENCOUNTER — Encounter: Payer: Self-pay | Admitting: Family Medicine

## 2015-03-25 ENCOUNTER — Ambulatory Visit (INDEPENDENT_AMBULATORY_CARE_PROVIDER_SITE_OTHER): Payer: Medicare Other | Admitting: Family Medicine

## 2015-03-25 VITALS — BP 126/68 | HR 76 | Temp 97.4°F | Resp 22 | Ht 69.0 in | Wt 211.0 lb

## 2015-03-25 DIAGNOSIS — I10 Essential (primary) hypertension: Secondary | ICD-10-CM | POA: Diagnosis not present

## 2015-03-25 DIAGNOSIS — E538 Deficiency of other specified B group vitamins: Secondary | ICD-10-CM

## 2015-03-25 DIAGNOSIS — R112 Nausea with vomiting, unspecified: Secondary | ICD-10-CM | POA: Diagnosis not present

## 2015-03-25 DIAGNOSIS — I2510 Atherosclerotic heart disease of native coronary artery without angina pectoris: Secondary | ICD-10-CM

## 2015-03-25 MED ORDER — METOCLOPRAMIDE HCL 5 MG PO TABS: 5.0000 mg | ORAL_TABLET | Freq: Three times a day (TID) | ORAL | Status: DC

## 2015-03-25 MED ORDER — CYANOCOBALAMIN 1000 MCG/ML IJ SOLN
1000.0000 ug | Freq: Once | INTRAMUSCULAR | Status: AC
  Administered 2015-03-25: 1000 ug via INTRAMUSCULAR

## 2015-03-25 MED ORDER — DOXAZOSIN MESYLATE 4 MG PO TABS: 4.0000 mg | ORAL_TABLET | Freq: Every day | ORAL | Status: DC

## 2015-03-25 NOTE — Telephone Encounter (Signed)
[facility] notified that patient never stared Revlimid

## 2015-03-25 NOTE — Progress Notes (Signed)
Subjective:    Patient ID: Ricardo Watkins, male    DOB: 08/05/1930, 81 y.o.   MRN: [id number]  HPI  10/17/14 Over the last 3 weeks, the patient has been developing severe headaches on the right side of his head. He is also having dizziness with standing. He denies any syncope or presyncope however he feels extremely lightheaded whenever he stands up. He has fallen on 2 separate occasions. The second time he struck his occiput. He denies any chest pain, palpitations. He denies any vertigo. Today on examination Dix-Hallpike maneuver is negative. However the headache is starting to worsen. It is constant. He denies any blurry vision. He denies any neurologic deficits. He does have a history of a stroke. He denies any strokelike symptoms. He denies any diplopia. He denies any memory loss or personality changes. However his dizziness with standing is worsening. His blood pressure has been running low recently at home between 101 115/40-50.  At that time, my plan was: I'm concerned that the patient may be getting dizzy and falling due to low blood pressure. Therefore I'm going to have the patient temporarily discontinue amlodipine as well as Flomax. I will recheck him here next week to see if his symptoms are improving. However given the fact that he has now developed a daily headache along with dizziness and he is recently had his head several times after falls, I will obtain a head CT without contrast to evaluate for subdural hematoma versus other intracranial pathology. Given his chronic kidney disease, I will not be able to use contrast. However we should be able to see any sign of bleeding without contrast. Patient's blood pressure is low. I will discontinue amlodipine. While he is here with a check a hemoglobin A1c along with a urine microalbumin to monitor his insulin-dependent diabetes mellitus. We may need to decrease his medication if his blood sugar is too tightly controlled. Given his age, and ideal  hemoglobin A1c will be between 7 and 8. I will also check a fasting lipid panel. Goal LDL cholesterol is less than 70 given his history of coronary artery disease as well as stroke. Patient is also having some chronic low back pain. This back pain has worsened after his fall yesterday. There is no tenderness to palpation over the spinous processes of the lumbar spine. The pain is more muscular in nature and Tylenol is not relieving his pain. Because of his chronic kidney disease he cannot take NSAIDs. Therefore I did give the patient hydrocodone/acetaminophen 5/325 one by mouth every 6 hours when necessary pain  10/24/14 Patient states he is doing much better. He has not fallen anymore. He is no longer having dizzy spells. Patient did have to resume the Flomax as he was having urinary retention without it. However he continues to refrain from using amlodipine. Unfortunately he saw his hematologist recently. His myelodysplasia is worsening. They're concerned about a reactivation of his non-Hodgkin's lymphoma. This scheduled the patient for a PET scan.  At that time, my plan was: Patient's dizziness seems to be related to his blood pressure. He feels much better since discontinuing amlodipine. His head CT showed no stroke, intracranial hemorrhage, or brain tumor. Therefore no further follow-up is necessary for this condition. I will eagerly await the results of his PET scan  01/24/15 Patient continues to complain of dizziness. He complains of poor balance with standing. His blood pressure at home has been low at times between 110 and 120/60-70. He is also on numerous  medications that can also affect his balance including oxybutynin, Flomax, and occasional pain medication. On examination today he has +1 pitting edema in both legs and faint bibasilar crackles. The patient is on Actos for diabetes.  At that time, my plan was: I have asked the patient to discontinue oxybutynin. I would like him to decrease Toprol-XL  to 50 mg by mouth daily. I would like to recheck in 2 weeks to see if his dizziness has improved. I will also schedule the patient for physical therapy to try to improve his strength, his coordination, and his balance. I will check a hemoglobin A1c and as long as his hemoglobin A1c is less than 8, I will discontinue Actos due to the swelling.  03/14/15 Here for follow up.  Patient's blood pressure is typically 160-170 over 90s in the night and early morning.  Throughout the day his blood pressure will drop after he takes his medication. He does feel better and less dizzy with the higher blood pressures. Unfortunately his platelet count has fallen to 40. He is working with the hematologist to treat this. He is on Plavix for coronary artery disease. However given his history of renal insufficiency, thrombocytopenia, I am very concerned about him having uncontrolled bleeding requiring hospitalization.  At that time, my plan was: Add clonidine 0.1 mg by mouth daily at bedtime. Recheck blood pressures in 2-3 weeks. Temporarily  Discontinue Plavix due to thrombocytopenia..  03/25/15 Patient feels poorly since I last saw him. He reports a dull pounding headache in the front of his head on a daily basis. He checks his blood pressure when he has a headache and it is elevated in the 99991111 range systolic. He also reports nausea. He reports early satiety. He reports vomiting. Patient has a long-standing history of insulin-dependent diabetes mellitus. He reports weakness. He blames this on the flu shot although last time I did start the patient on clonidine. He is still having bowel movements. He is still passing flatus. Any fevers or chills. He denies any symptoms of a sinus infection. His blood pressure is fluctuating but typically ranges 140-170/90-100 Past Medical History  Diagnosis Date  . PVD (peripheral vascular disease)     rt renal artery stent  . CAD (coronary artery disease)     30% LAD Stenosis, 70%  ramus intermedius stenosis, treated with PTCA and angioplasty by Dr Albertine Patricia 2004  . DM (diabetes mellitus)   . Dyslipidemia   . Renal insufficiency   . Thrombocytopenia   . Hypertension   . Vitamin D deficiency   . Non Hodgkin's lymphoma   . Inguinal hernia     right  . Adenomatous colon polyp   . Cataract     right eye  . Hyperlipidemia   . Prostate cancer     seed implants no recurrence  . 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 Prostate ca 09/10/2011    Gleason 3+3 R, 3+4 L lobe May 2007 Rx Radioactive seed implants Dr [name]  . DVT (deep venous thrombosis)     secondary to surgery  . UTI (urinary tract infection)   . 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 Deficiency anemia 04/19/2014  . Diverticulosis    Past Surgical History  Procedure Laterality Date  . Cystourethroscopy  ROBOTIC ARM NUCLETRON SEED IMPLANTATION OF PROSTATE  . Exploratory laparotomy      For evaluation of lymphoma  . Colonoscopy    . Appendectomy      patient ?   Current Outpatient Prescriptions on File Prior to Visit  Medication Sig Dispense Refill  . acetaminophen (TYLENOL) 500 MG tablet Take 500 mg by mouth every 6 (six) hours as needed for pain.    Marland Kitchen atorvastatin (LIPITOR) 10 MG tablet TAKE 1/2 TABLET BY MOUTH DAILY 30 tablet 5  . cloNIDine (CATAPRES) 0.1 MG tablet Take 1 tablet (0.1 mg total) by mouth at bedtime. 30 tablet 3  . clopidogrel (PLAVIX) 75 MG tablet TAKE 1 TABLET BY MOUTH DAILY WITH BREAKFAST. 90 tablet 1  . cyanocobalamin (,VITAMIN B-12,) 1000 MCG/ML injection Inject 1,000 mcg into the muscle every 30 (thirty) days.     Marland Kitchen eltrombopag (PROMACTA) 50 MG tablet Take 1 tablet (50 mg total) by mouth daily. Take on an empty stomach 1 hour before a meal or 2 hours after (Patient not taking:  Reported on 03/14/2015) 30 tablet 6  . HYDROcodone-acetaminophen (NORCO) 5-325 MG per tablet Take 1 tablet by mouth every 6 (six) hours as needed for moderate pain. 30 tablet 0  . LANTUS SOLOSTAR 100 UNIT/ML Solostar Pen INJECT 25 UNITS INTO THE SKIN AT BEDTIME. 15 pen 3  . metoprolol succinate (TOPROL-XL) 100 MG 24 hr tablet TAKE 1 TABLET DAILY WITH A MEAL OR IMMEDIATELY FOLLOWING A MEAL 90 tablet 3  . pioglitazone (ACTOS) 30 MG tablet TAKE 1 TABLET EVERY DAY 90 tablet 1  . quinapril (ACCUPRIL) 20 MG tablet Take 20 mg by mouth daily.    . Tamsulosin HCl (FLOMAX) 0.4 MG CAPS Take 0.4 mg by mouth daily.       No current facility-administered medications on file prior to visit.   Allergies  Allergen Reactions  . Glucophage [Metformin Hydrochloride]     Chest pain  . Zetia [Ezetimibe]     weakness  . Fenofibrate Rash  . Niacin-Lovastatin Er Rash   Social History   Social History  . Marital Status: Married    Spouse Name: N/A  . Number of Children: 4  . Years of Education: N/A   Occupational History  . retired    Social History Main Topics  . Smoking status: Former Smoker    Types: Cigarettes    Quit date: 06/21/1958  . Smokeless tobacco: Never Used     Comment: Quit 30 years ago. and use to smoke pipe but quit  . Alcohol Use: No  . Drug Use: No  . Sexual Activity:    Partners: Female   Other Topics Concern  . Not on file   Social History Narrative     Review of Systems  All other systems reviewed and are negative.      Objective:   Physical Exam  Constitutional: He is oriented to person, place, and time. He appears well-developed and well-nourished.  HENT:  Nose: Nose normal.  Mouth/Throat: Oropharynx is clear and moist.  Neck: Neck supple.  Cardiovascular: Normal rate, regular rhythm and normal heart sounds.   No murmur heard. Pulmonary/Chest: Effort normal and breath sounds normal. No respiratory distress. He has no wheezes. He has no rales.  Abdominal:  Soft. Bowel sounds are normal. He exhibits no distension. There is no tenderness. There is no rebound and no guarding.  Musculoskeletal: He exhibits no edema.  Lymphadenopathy:    He has no cervical adenopathy.  Neurological: He  is alert and oriented to person, place, and time. He has normal reflexes. No cranial nerve deficit. He exhibits normal muscle tone. Coordination normal.  Vitals reviewed.         Assessment & Plan:  Benign essential HTN - Plan: doxazosin (CARDURA) 4 MG tablet  Nausea and vomiting, intractability of vomiting not specified, unspecified vomiting type - Plan: metoCLOPramide (REGLAN) 5 MG tablet  B12 deficiency - Plan: cyanocobalamin ((VITAMIN B-12)) injection 1,000 mcg  I am concerned that the clonidine may be causing the patient to feel poorly. He can certainly cause fatigue, sleepiness, and a headache. I am also concerned that his headache may be due to his uncontrolled blood pressure. Therefore I will discontinue clonidine. I will start the patient on boxes 074 mg by mouth daily to help treat his blood pressure. Patient cannot stop Flomax. However I will discontinue Flomax and hopefully doxazosin can help with his BPH symptoms. We will need to monitor closely for dizziness on this medication as he has recently been complaining of dizziness. I'm concerned that the nausea and vomiting may be due to some type of diabetic gastroparesis. I'll start the patient on Reglan 5 mg by mouth every before meals. Recheck the patient in one week or sooner if worse

## 2015-03-25 NOTE — Telephone Encounter (Signed)
I received insurance decline on Revlimid so decision was made to switch him to [employer]

## 2015-03-26 ENCOUNTER — Encounter: Payer: Self-pay | Admitting: Hematology and Oncology

## 2015-03-26 NOTE — Progress Notes (Signed)
Per [facility] promacta approved thru 05/14/15--prior auth. I sent to medical records

## 2015-03-28 ENCOUNTER — Telehealth: Payer: Self-pay | Admitting: Pharmacist

## 2015-03-28 NOTE — Telephone Encounter (Signed)
I called biologics specialty this morning to follow up on copay assistance for promacta for Mr. Orcutt. PAN[facility] assistance was available and copay will be $0 (previously > $2500).   Prescription shipping was delayed due to error on biologics. They will correct this and call patient today to set up delivery (high priority per [facility] with biologics).   I called patient to update him on this information and to inform him of copay at $0. He was much appreciative.   I will follow up with patient next week.  Thank you,  Montel Clock, PharmD, BCOP

## 2015-03-31 ENCOUNTER — Encounter: Payer: Self-pay | Admitting: Family Medicine

## 2015-03-31 ENCOUNTER — Ambulatory Visit (INDEPENDENT_AMBULATORY_CARE_PROVIDER_SITE_OTHER): Payer: Medicare Other | Admitting: Family Medicine

## 2015-03-31 VITALS — BP 118/70 | HR 78 | Temp 97.4°F | Resp 16 | Wt 214.0 lb

## 2015-03-31 DIAGNOSIS — I2510 Atherosclerotic heart disease of native coronary artery without angina pectoris: Secondary | ICD-10-CM

## 2015-03-31 DIAGNOSIS — D469 Myelodysplastic syndrome, unspecified: Secondary | ICD-10-CM | POA: Diagnosis not present

## 2015-03-31 LAB — COMPLETE METABOLIC PANEL WITH GFR
ALBUMIN: 3.1 g/dL — AB (ref 3.6–5.1)
ALK PHOS: 66 U/L (ref 40–115)
ALT: 10 U/L (ref 9–46)
AST: 12 U/L (ref 10–35)
BILIRUBIN TOTAL: 0.4 mg/dL (ref 0.2–1.2)
BUN: 14 mg/dL (ref 7–25)
CALCIUM: 8.1 mg/dL — AB (ref 8.6–10.3)
CO2: 27 mmol/L (ref 20–31)
Chloride: 99 mmol/L (ref 98–110)
Creat: 1.35 mg/dL — ABNORMAL HIGH (ref 0.70–1.11)
GFR, EST NON AFRICAN AMERICAN: 49 mL/min — AB (ref >=60)
GFR, Est African American: 56 mL/min — ABNORMAL LOW (ref >=60)
GLUCOSE: 346 mg/dL — AB (ref 70–99)
Potassium: 3.3 mmol/L — ABNORMAL LOW (ref 3.5–5.3)
SODIUM: 134 mmol/L — AB (ref 135–146)
TOTAL PROTEIN: 6.2 g/dL (ref 6.1–8.1)

## 2015-03-31 LAB — CBC WITH DIFFERENTIAL/PLATELET
Basophils Absolute: 0 10*3/uL (ref 0.0–0.1)
Basophils Relative: 0 % (ref 0–1)
Eosinophils Absolute: 0.1 10*3/uL (ref 0.0–0.7)
Eosinophils Relative: 1 % (ref 0–5)
HEMATOCRIT: 32 % — AB (ref 39.0–52.0)
HEMOGLOBIN: 9.9 g/dL — AB (ref 13.0–17.0)
LYMPHS ABS: 1.5 10*3/uL (ref 0.7–4.0)
LYMPHS PCT: 28 % (ref 12–46)
MCH: 23.5 pg — AB (ref 26.0–34.0)
MCHC: 30.9 g/dL (ref 30.0–36.0)
MCV: 76 fL — AB (ref 78.0–100.0)
MONO ABS: 0.5 10*3/uL (ref 0.1–1.0)
MONOS PCT: 9 % (ref 3–12)
NEUTROS ABS: 3.2 10*3/uL (ref 1.7–7.7)
NEUTROS PCT: 62 % (ref 43–77)
Platelets: 42 10*3/uL — ABNORMAL LOW (ref 150–400)
RBC: 4.21 MIL/uL — ABNORMAL LOW (ref 4.22–5.81)
RDW: 26.8 % — ABNORMAL HIGH (ref 11.5–15.5)
WBC: 5.2 10*3/uL (ref 4.0–10.5)

## 2015-03-31 NOTE — Progress Notes (Signed)
Subjective:    Patient ID: Ricardo Watkins, male    DOB: 1931-02-28, 81 y.o.   MRN: [id number]  HPI  10/17/14 Over the last 3 weeks, the patient has been developing severe headaches on the right side of his head. He is also having dizziness with standing. He denies any syncope or presyncope however he feels extremely lightheaded whenever he stands up. He has fallen on 2 separate occasions. The second time he struck his occiput. He denies any chest pain, palpitations. He denies any vertigo. Today on examination Dix-Hallpike maneuver is negative. However the headache is starting to worsen. It is constant. He denies any blurry vision. He denies any neurologic deficits. He does have a history of a stroke. He denies any strokelike symptoms. He denies any diplopia. He denies any memory loss or personality changes. However his dizziness with standing is worsening. His blood pressure has been running low recently at home between 101 115/40-50.  At that time, my plan was: I'm concerned that the patient may be getting dizzy and falling due to low blood pressure. Therefore I'm going to have the patient temporarily discontinue amlodipine as well as Flomax. I will recheck him here next week to see if his symptoms are improving. However given the fact that he has now developed a daily headache along with dizziness and he is recently had his head several times after falls, I will obtain a head CT without contrast to evaluate for subdural hematoma versus other intracranial pathology. Given his chronic kidney disease, I will not be able to use contrast. However we should be able to see any sign of bleeding without contrast. Patient's blood pressure is low. I will discontinue amlodipine. While he is here with a check a hemoglobin A1c along with a urine microalbumin to monitor his insulin-dependent diabetes mellitus. We may need to decrease his medication if his blood sugar is too tightly controlled. Given his age, and ideal  hemoglobin A1c will be between 7 and 8. I will also check a fasting lipid panel. Goal LDL cholesterol is less than 70 given his history of coronary artery disease as well as stroke. Patient is also having some chronic low back pain. This back pain has worsened after his fall yesterday. There is no tenderness to palpation over the spinous processes of the lumbar spine. The pain is more muscular in nature and Tylenol is not relieving his pain. Because of his chronic kidney disease he cannot take NSAIDs. Therefore I did give the patient hydrocodone/acetaminophen 5/325 one by mouth every 6 hours when necessary pain  10/24/14 Patient states he is doing much better. He has not fallen anymore. He is no longer having dizzy spells. Patient did have to resume the Flomax as he was having urinary retention without it. However he continues to refrain from using amlodipine. Unfortunately he saw his hematologist recently. His myelodysplasia is worsening. They're concerned about a reactivation of his non-Hodgkin's lymphoma. This scheduled the patient for a PET scan.  At that time, my plan was: Patient's dizziness seems to be related to his blood pressure. He feels much better since discontinuing amlodipine. His head CT showed no stroke, intracranial hemorrhage, or brain tumor. Therefore no further follow-up is necessary for this condition. I will eagerly await the results of his PET scan  01/24/15 Patient continues to complain of dizziness. He complains of poor balance with standing. His blood pressure at home has been low at times between 110 and 120/60-70. He is also on numerous  medications that can also affect his balance including oxybutynin, Flomax, and occasional pain medication. On examination today he has +1 pitting edema in both legs and faint bibasilar crackles. The patient is on Actos for diabetes.  At that time, my plan was: I have asked the patient to discontinue oxybutynin. I would like him to decrease Toprol-XL  to 50 mg by mouth daily. I would like to recheck in 2 weeks to see if his dizziness has improved. I will also schedule the patient for physical therapy to try to improve his strength, his coordination, and his balance. I will check a hemoglobin A1c and as long as his hemoglobin A1c is less than 8, I will discontinue Actos due to the swelling.  03/14/15 Here for follow up.  Patient's blood pressure is typically 160-170 over 90s in the night and early morning.  Throughout the day his blood pressure will drop after he takes his medication. He does feel better and less dizzy with the higher blood pressures. Unfortunately his platelet count has fallen to 40. He is working with the hematologist to treat this. He is on Plavix for coronary artery disease. However given his history of renal insufficiency, thrombocytopenia, I am very concerned about him having uncontrolled bleeding requiring hospitalization.  At that time, my plan was: Add clonidine 0.1 mg by mouth daily at bedtime. Recheck blood pressures in 2-3 weeks. Temporarily  Discontinue Plavix due to thrombocytopenia..  03/25/15 Patient feels poorly since I last saw him. He reports a dull pounding headache in the front of his head on a daily basis. He checks his blood pressure when he has a headache and it is elevated in the [id number] range systolic. He also reports nausea. He reports early satiety. He reports vomiting. Patient has a long-standing history of insulin-dependent diabetes mellitus. He reports weakness. He blames this on the flu shot although last time I did start the patient on clonidine. He is still having bowel movements. He is still passing flatus. Any fevers or chills. He denies any symptoms of a sinus infection. His blood pressure is fluctuating but typically ranges 140-170/90-100.  At that time, my plan was: I am concerned that the clonidine may be causing the patient to feel poorly. He can certainly cause fatigue, sleepiness, and a headache. I  am also concerned that his headache may be due to his uncontrolled blood pressure. Therefore I will discontinue clonidine. I will start the patient on boxes 074 mg by mouth daily to help treat his blood pressure. Patient cannot stop Flomax. However I will discontinue Flomax and hopefully doxazosin can help with his BPH symptoms. We will need to monitor closely for dizziness on this medication as he has recently been complaining of dizziness. I'm concerned that the nausea and vomiting may be due to some type of diabetic gastroparesis. I'll start the patient on Reglan 5 mg by mouth every before meals. Recheck the patient in one week or sooner if worse.  03/31/15 Patient's blood pressure has improved. His blood pressure is ranging 140-150/90. His nausea has improved although now he is developing diarrhea. He is still taking Reglan 3 times a day. Unfortunately he reports fevers and night sweats and chills. This is been going on now for the last few weeks. He also reports diffuse bone pain. He states he feels like the way he felt when he was diagnosed with non-Hodgkin's lymphoma. He does have a history of myelodysplastic syndrome. He also has thrombocytopenia. Past Medical History  Diagnosis Date  .  PVD (peripheral vascular disease) ([name])     rt renal artery stent  . CAD (coronary artery disease)     30% LAD Stenosis, 70% ramus intermedius stenosis, treated with PTCA and angioplasty by Dr Albertine Patricia 2004  . DM (diabetes mellitus) ([place])   . Dyslipidemia   . Renal insufficiency   . Thrombocytopenia ([place])   . Hypertension   . Vitamin D deficiency   . Non Hodgkin's lymphoma ([place])   . Inguinal hernia     right  . Adenomatous colon polyp   . Cataract     right eye  . Hyperlipidemia   . Prostate cancer ([place])     seed implants no recurrence  . 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  Prostate CA ([place]) 09/10/2011    Gleason 3+3 R, 3+4 L lobe May 2007 Rx Radioactive seed implants Dr [name]  . DVT (deep venous thrombosis) ([place])     secondary to surgery  . UTI (urinary tract infection)   . 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 Deficiency anemia 04/19/2014  . Diverticulosis    Past Surgical History  Procedure Laterality Date  . Cystourethroscopy      ROBOTIC ARM NUCLETRON SEED IMPLANTATION OF PROSTATE  . Exploratory laparotomy      For evaluation of lymphoma  . Colonoscopy    . Appendectomy      patient ?   Current Outpatient Prescriptions on File Prior to Visit  Medication Sig Dispense Refill  . acetaminophen (TYLENOL) 500 MG tablet Take 500 mg by mouth every 6 (six) hours as needed for pain.    Marland Kitchen atorvastatin (LIPITOR) 10 MG tablet TAKE 1/2 TABLET BY MOUTH DAILY 30 tablet 5  . cloNIDine (CATAPRES) 0.1 MG tablet Take 1 tablet (0.1 mg total) by mouth at bedtime. 30 tablet 3  . clopidogrel (PLAVIX) 75 MG tablet TAKE 1 TABLET BY MOUTH DAILY WITH BREAKFAST. 90 tablet 1  . cyanocobalamin (,VITAMIN B-12,) 1000 MCG/ML injection Inject 1,000 mcg into the muscle every 30 (thirty) days.     Marland Kitchen doxazosin (CARDURA) 4 MG tablet Take 1 tablet (4 mg total) by mouth daily. 30 tablet 5  . eltrombopag (PROMACTA) 50 MG tablet Take 1 tablet (50 mg total) by mouth daily. Take on an empty stomach 1 hour before a meal or 2 hours after 30 tablet 6  . HYDROcodone-acetaminophen (NORCO) 5-325 MG per tablet Take 1 tablet by mouth every 6 (six) hours as needed for moderate pain. 30 tablet 0  . LANTUS SOLOSTAR 100 UNIT/ML Solostar Pen INJECT 25 UNITS INTO THE SKIN AT BEDTIME. 15 pen 3  . metoCLOPramide (REGLAN) 5 MG tablet Take 1 tablet (5 mg total) by mouth 3 (three) times daily before meals. 90 tablet 0  . metoprolol succinate (TOPROL-XL) 100 MG 24 hr tablet TAKE 1 TABLET DAILY WITH A MEAL OR IMMEDIATELY FOLLOWING A  MEAL 90 tablet 3  . pioglitazone (ACTOS) 30 MG tablet TAKE 1 TABLET EVERY DAY 90 tablet 1  . quinapril (ACCUPRIL) 20 MG tablet Take 20 mg by mouth daily.    . Tamsulosin HCl (FLOMAX) 0.4 MG CAPS Take 0.4 mg by mouth daily.       No current facility-administered medications on file prior to visit.   Allergies  Allergen Reactions  .  Glucophage [Metformin Hydrochloride]     Chest pain  . Zetia [Ezetimibe]     weakness  . Fenofibrate Rash  . Niacin-Lovastatin Er Rash   Social History   Social History  . Marital Status: Married    Spouse Name: N/A  . Number of Children: 4  . Years of Education: N/A   Occupational History  . retired    Social History Main Topics  . Smoking status: Former Smoker    Types: Cigarettes    Quit date: 06/21/1958  . Smokeless tobacco: Never Used     Comment: Quit 30 years ago. and use to smoke pipe but quit  . Alcohol Use: No  . Drug Use: No  . Sexual Activity:    Partners: Female   Other Topics Concern  . Not on file   Social History Narrative     Review of Systems  All other systems reviewed and are negative.      Objective:   Physical Exam  Constitutional: He is oriented to person, place, and time. He appears well-developed and well-nourished.  HENT:  Nose: Nose normal.  Mouth/Throat: Oropharynx is clear and moist.  Neck: Neck supple.  Cardiovascular: Normal rate, regular rhythm and normal heart sounds.   No murmur heard. Pulmonary/Chest: Effort normal and breath sounds normal. No respiratory distress. He has no wheezes. He has no rales.  Abdominal: Soft. Bowel sounds are normal. He exhibits no distension. There is no tenderness. There is no rebound and no guarding.  Musculoskeletal: He exhibits no edema.  Lymphadenopathy:    He has no cervical adenopathy.  Neurological: He is alert and oriented to person, place, and time. He has normal reflexes. No cranial nerve deficit. He exhibits normal muscle tone. Coordination normal.    Vitals reviewed.         Assessment & Plan:  Myelodysplastic syndrome ([place]) - Plan: CBC with Differential/Platelet, COMPLETE METABOLIC PANEL WITH GFR, Save smear  While the patient's symptoms have improved which we saw him for the last week, I am concerned for diffuse bone pain and muscle pains and subjective fevers and chills and night sweats could be a sign that his myelodysplastic syndrome is activating into some type of bone marrow neoplasm. Therefore I will check a CBC today to monitor his white blood cell count.  Management decisions will be based upon the results of the patient's lab work.  I would also consider an SPEP/UPEP, and/or a skeletal survey of the areas of maximum tenderness.

## 2015-04-02 ENCOUNTER — Other Ambulatory Visit: Payer: Self-pay | Admitting: Hematology and Oncology

## 2015-04-02 ENCOUNTER — Encounter: Payer: Self-pay | Admitting: Pharmacist

## 2015-04-02 ENCOUNTER — Telehealth: Payer: Self-pay | Admitting: Hematology and Oncology

## 2015-04-02 ENCOUNTER — Other Ambulatory Visit: Payer: Self-pay | Admitting: Family Medicine

## 2015-04-02 DIAGNOSIS — Z5111 Encounter for antineoplastic chemotherapy: Secondary | ICD-10-CM | POA: Insufficient documentation

## 2015-04-02 DIAGNOSIS — D46C Myelodysplastic syndrome with isolated del(5q) chromosomal abnormality: Secondary | ICD-10-CM

## 2015-04-02 DIAGNOSIS — M898X9 Other specified disorders of bone, unspecified site: Secondary | ICD-10-CM

## 2015-04-02 NOTE — Progress Notes (Signed)
Oral Chemotherapy Pharmacist Encounter   I spoke with patient for overview of new oral chemotherapy medication: Promacta. Pt is doing well. The prescription is being filled through biologics specialty pharmacy. Copay was reduced to $0.   Counseled patient on administration, dosing, side effects, safe handling, and monitoring. Side effects include but not limited to: fatigue, Nausea, vomiting, and diarrhea, anemia, weakness, cough, and elevated liver enzymes. Ricardo Watkins voiced understanding and appreciation.   He has received the medication and will start today 04/02/15. He knows to take once daily on an empty stomach  All questions answered.  Will follow up in 1-2 weeks for adherence and toxicity management.   Thank you,  Montel Clock, PharmD, [facility]

## 2015-04-02 NOTE — Progress Notes (Signed)
Thanks for the reminder I have placed a POF to see him next Ricardo Watkins

## 2015-04-02 NOTE — Telephone Encounter (Signed)
s.w. pt and advised on 10.17 appt.Marland KitchenMarland KitchenMarland KitchenMarland Kitchenpt ok and aware

## 2015-04-03 ENCOUNTER — Other Ambulatory Visit: Payer: Medicare Other

## 2015-04-03 ENCOUNTER — Other Ambulatory Visit: Payer: Self-pay | Admitting: Family Medicine

## 2015-04-03 DIAGNOSIS — M898X9 Other specified disorders of bone, unspecified site: Secondary | ICD-10-CM

## 2015-04-03 DIAGNOSIS — M899 Disorder of bone, unspecified: Secondary | ICD-10-CM | POA: Diagnosis not present

## 2015-04-03 DIAGNOSIS — D469 Myelodysplastic syndrome, unspecified: Secondary | ICD-10-CM

## 2015-04-04 ENCOUNTER — Telehealth: Payer: Self-pay | Admitting: *Deleted

## 2015-04-04 ENCOUNTER — Ambulatory Visit
Admission: RE | Admit: 2015-04-04 | Discharge: 2015-04-04 | Disposition: A | Discharge status: Home / Self Care | Payer: Medicare Other | Source: Ambulatory Visit | Attending: Family Medicine | Admitting: Family Medicine

## 2015-04-04 DIAGNOSIS — M19012 Primary osteoarthritis, left shoulder: Secondary | ICD-10-CM | POA: Diagnosis not present

## 2015-04-04 DIAGNOSIS — M898X9 Other specified disorders of bone, unspecified site: Secondary | ICD-10-CM

## 2015-04-04 DIAGNOSIS — M47816 Spondylosis without myelopathy or radiculopathy, lumbar region: Secondary | ICD-10-CM | POA: Diagnosis not present

## 2015-04-04 DIAGNOSIS — M19011 Primary osteoarthritis, right shoulder: Secondary | ICD-10-CM | POA: Diagnosis not present

## 2015-04-04 DIAGNOSIS — M545 Low back pain, unspecified: Secondary | ICD-10-CM

## 2015-04-04 DIAGNOSIS — M47812 Spondylosis without myelopathy or radiculopathy, cervical region: Secondary | ICD-10-CM | POA: Diagnosis not present

## 2015-04-04 DIAGNOSIS — D469 Myelodysplastic syndrome, unspecified: Secondary | ICD-10-CM

## 2015-04-04 DIAGNOSIS — M47814 Spondylosis without myelopathy or radiculopathy, thoracic region: Secondary | ICD-10-CM | POA: Diagnosis not present

## 2015-04-04 MED ORDER — HYDROCODONE-ACETAMINOPHEN 5-325 MG PO TABS: 1.0000 | ORAL_TABLET | Freq: Four times a day (QID) | ORAL | Status: DC | PRN

## 2015-04-04 NOTE — Telephone Encounter (Signed)
Pt aware that script is printed and pt will be in today to pickk up

## 2015-04-04 NOTE — Telephone Encounter (Signed)
Ok, make sure he stops his lipitor

## 2015-04-04 NOTE — Telephone Encounter (Signed)
Pt son calling stating pt is requesting Hydrocodone for pain  ?ok to refill  Last rf: 10/17/14  Last ov:03/31/15

## 2015-04-07 ENCOUNTER — Encounter: Payer: Self-pay | Admitting: Hematology and Oncology

## 2015-04-07 ENCOUNTER — Other Ambulatory Visit (HOSPITAL_BASED_OUTPATIENT_CLINIC_OR_DEPARTMENT_OTHER): Payer: Medicare Other

## 2015-04-07 ENCOUNTER — Other Ambulatory Visit: Payer: Medicare Other

## 2015-04-07 ENCOUNTER — Ambulatory Visit (HOSPITAL_BASED_OUTPATIENT_CLINIC_OR_DEPARTMENT_OTHER): Payer: Medicare Other | Admitting: Hematology and Oncology

## 2015-04-07 ENCOUNTER — Telehealth: Payer: Self-pay | Admitting: Hematology and Oncology

## 2015-04-07 VITALS — BP 149/57 | HR 99 | Temp 97.4°F | Resp 18 | Ht 69.0 in | Wt 210.6 lb

## 2015-04-07 DIAGNOSIS — R197 Diarrhea, unspecified: Secondary | ICD-10-CM

## 2015-04-07 DIAGNOSIS — D61818 Other pancytopenia: Secondary | ICD-10-CM | POA: Diagnosis not present

## 2015-04-07 DIAGNOSIS — N183 Chronic kidney disease, stage 3 unspecified: Secondary | ICD-10-CM

## 2015-04-07 DIAGNOSIS — D46C Myelodysplastic syndrome with isolated del(5q) chromosomal abnormality: Secondary | ICD-10-CM

## 2015-04-07 DIAGNOSIS — M899 Disorder of bone, unspecified: Secondary | ICD-10-CM | POA: Diagnosis not present

## 2015-04-07 LAB — COMPREHENSIVE METABOLIC PANEL (CC13)
ALBUMIN: 3.1 g/dL — AB (ref 3.5–5.0)
ALK PHOS: 72 U/L (ref 40–150)
ALT: 11 U/L (ref 0–55)
ANION GAP: 10 meq/L (ref 3–11)
AST: 11 U/L (ref 5–34)
BILIRUBIN TOTAL: 0.36 mg/dL (ref 0.20–1.20)
BUN: 11.9 mg/dL (ref 7.0–26.0)
CALCIUM: 8.1 mg/dL — AB (ref 8.4–10.4)
CO2: 28 meq/L (ref 22–29)
CREATININE: 1.4 mg/dL — AB (ref 0.7–1.3)
Chloride: 101 mEq/L (ref 98–109)
EGFR: 47 mL/min/{1.73_m2} — ABNORMAL LOW (ref >90)
Glucose: 210 mg/dl — ABNORMAL HIGH (ref 70–140)
Potassium: 3.1 mEq/L — ABNORMAL LOW (ref 3.5–5.1)
SODIUM: 139 meq/L (ref 136–145)
Total Protein: 7.2 g/dL (ref 6.4–8.3)

## 2015-04-07 LAB — CBC WITH DIFFERENTIAL/PLATELET
BASO%: 0.3 % (ref 0.0–2.0)
Basophils Absolute: 0 10*3/uL (ref 0.0–0.1)
EOS%: 0.4 % (ref 0.0–7.0)
Eosinophils Absolute: 0 10*3/uL (ref 0.0–0.5)
HEMATOCRIT: 33.6 % — AB (ref 38.4–49.9)
HEMOGLOBIN: 10.2 g/dL — AB (ref 13.0–17.1)
LYMPH#: 3 10*3/uL (ref 0.9–3.3)
LYMPH%: 42.8 % (ref 14.0–49.0)
MCH: 23.7 pg — AB (ref 27.2–33.4)
MCHC: 30.4 g/dL — ABNORMAL LOW (ref 32.0–36.0)
MCV: 78.1 fL — ABNORMAL LOW (ref 79.3–98.0)
MONO#: 0.9 10*3/uL (ref 0.1–0.9)
MONO%: 13.5 % (ref 0.0–14.0)
NEUT%: 43 % (ref 39.0–75.0)
NEUTROS ABS: 3 10*3/uL (ref 1.5–6.5)
Platelets: 68 10*3/uL — ABNORMAL LOW (ref 140–400)
RBC: 4.3 10*6/uL (ref 4.20–5.82)
RDW: 27.7 % — AB (ref 11.0–14.6)
WBC: 6.9 10*3/uL (ref 4.0–10.3)
nRBC: 1 % — ABNORMAL HIGH (ref 0–0)

## 2015-04-07 LAB — PROTEIN ELECTROPHORESIS, SERUM, WITH REFLEX
ALPHA-2-GLOBULIN: 0.8 g/dL (ref 0.5–0.9)
Albumin ELP: 3 g/dL — ABNORMAL LOW (ref 3.8–4.8)
Alpha-1-Globulin: 0.4 g/dL — ABNORMAL HIGH (ref 0.2–0.3)
BETA GLOBULIN: 0.4 g/dL (ref 0.4–0.6)
Beta 2: 0.5 g/dL (ref 0.2–0.5)
Gamma Globulin: 1.3 g/dL (ref 0.8–1.7)
TOTAL PROTEIN, SERUM ELECTROPHOR: 6.4 g/dL (ref 6.1–8.1)

## 2015-04-07 LAB — TECHNOLOGIST REVIEW: Technologist Review: 2

## 2015-04-07 LAB — IFE INTERPRETATION

## 2015-04-07 LAB — IGG, IGA, IGM
IGA: 594 mg/dL — AB (ref 68–379)
IGG (IMMUNOGLOBIN G), SERUM: 1480 mg/dL (ref 650–1600)
IgM, Serum: 103 mg/dL (ref 41–251)

## 2015-04-07 NOTE — Telephone Encounter (Signed)
Gave and printd appt sched and avs for pt for OCT and NOV °

## 2015-04-08 DIAGNOSIS — D61818 Other pancytopenia: Secondary | ICD-10-CM | POA: Insufficient documentation

## 2015-04-08 DIAGNOSIS — R197 Diarrhea, unspecified: Secondary | ICD-10-CM | POA: Insufficient documentation

## 2015-04-08 NOTE — Progress Notes (Signed)
[place] OFFICE PROGRESS NOTE  Patient Care Team: [name], MD as PCP - General (Family Medicine) Heath Lark, MD as Consulting Physician (Hematology and Oncology)  SUMMARY OF ONCOLOGIC HISTORY: Oncology History   Calculated R-IPSS score at low risk     MDS (myelodysplastic syndrome) with 5q deletion ([place])   11/07/2014 Bone Marrow Biopsy Accession: [id number] marrow biopsy showed myelodysplastic syndrome with 5q deletion.   04/02/2015 -  Chemotherapy He started taking Promacta daily for thrombocytopenia    INTERVAL HISTORY: Please see below for problem oriented charting. The patient had recent diarrhea unrelated to [facility]. It was associated with crampy abdominal pain. He denies any melena or hematochezia. He claimed he is drinking plenty of fluid. Denies fevers or chills. The patient denies any recent signs or symptoms of bleeding such as spontaneous epistaxis, hematuria or hematochezia.   REVIEW OF SYSTEMS:   Constitutional: Denies fevers, chills or abnormal weight loss Eyes: Denies blurriness of vision Ears, nose, mouth, throat, and face: Denies mucositis or sore throat Respiratory: Denies cough, dyspnea or wheezes Cardiovascular: Denies palpitation, chest discomfort or lower extremity swelling Skin: Denies abnormal skin rashes Lymphatics: Denies new lymphadenopathy or easy bruising Neurological:Denies numbness, tingling or new weaknesses Behavioral/Psych: Mood is stable, no new changes  All other systems were reviewed with the patient and are negative.  I have reviewed the past medical history, past surgical history, social history and family history with the patient and they are unchanged from previous note.  ALLERGIES:  is allergic to glucophage; zetia; fenofibrate; and niacin-lovastatin er.  MEDICATIONS:  Current Outpatient Prescriptions  Medication Sig Dispense Refill  . acetaminophen (TYLENOL) 500 MG tablet Take 500 mg by mouth every 6  (six) hours as needed for pain.    . cyanocobalamin (,VITAMIN B-12,) 1000 MCG/ML injection Inject 1,000 mcg into the muscle every 30 (thirty) days.     Marland Kitchen doxazosin (CARDURA) 4 MG tablet Take 1 tablet (4 mg total) by mouth daily. 30 tablet 5  . eltrombopag (PROMACTA) 50 MG tablet Take 1 tablet (50 mg total) by mouth daily. Take on an empty stomach 1 hour before a meal or 2 hours after 30 tablet 6  . HYDROcodone-acetaminophen (NORCO) 5-325 MG tablet Take 1 tablet by mouth every 6 (six) hours as needed for moderate pain. 30 tablet 0  . LANTUS SOLOSTAR 100 UNIT/ML Solostar Pen INJECT 25 UNITS INTO THE SKIN AT BEDTIME. 15 pen 3  . metoCLOPramide (REGLAN) 5 MG tablet Take 1 tablet (5 mg total) by mouth 3 (three) times daily before meals. 90 tablet 0  . metoprolol succinate (TOPROL-XL) 100 MG 24 hr tablet TAKE 1 TABLET DAILY WITH A MEAL OR IMMEDIATELY FOLLOWING A MEAL 90 tablet 3  . quinapril (ACCUPRIL) 20 MG tablet Take 20 mg by mouth daily.     No current facility-administered medications for this visit.    PHYSICAL EXAMINATION: ECOG PERFORMANCE STATUS: 1 - Symptomatic but completely ambulatory  Filed Vitals:   04/07/15 1357  BP: 149/57  Pulse: 99  Temp: 97.4 F (36.3 C)  Resp: 18   Filed Weights   04/07/15 1357  Weight: 210 lb 9.6 oz (95.528 kg)    GENERAL:alert, no distress and comfortable SKIN: skin color, texture, turgor are normal, no rashes or significant lesions EYES: normal, Conjunctiva are pink and non-injected, sclera clear Musculoskeletal:no cyanosis of digits and no clubbing  NEURO: alert & oriented x 3 with fluent speech, no focal motor/sensory deficits  LABORATORY DATA:  I have reviewed  the data as listed    Component Value Date/Time   NA 139 04/07/2015 1328   NA 134* 03/31/2015 1210   K 3.1* 04/07/2015 1328   K 3.3* 03/31/2015 1210   CL 99 03/31/2015 1210   CL 107 09/27/2012 0940   CO2 28 04/07/2015 1328   CO2 27 03/31/2015 1210   GLUCOSE 210* 04/07/2015 1328    GLUCOSE 346* 03/31/2015 1210   GLUCOSE 89 09/27/2012 0940   BUN 11.9 04/07/2015 1328   BUN 14 03/31/2015 1210   CREATININE 1.4* 04/07/2015 1328   CREATININE 1.35* 03/31/2015 1210   CREATININE 1.64* 09/10/2011 1204   CALCIUM 8.1* 04/07/2015 1328   CALCIUM 8.1* 03/31/2015 1210   PROT 7.2 04/07/2015 1328   PROT 6.2 03/31/2015 1210   ALBUMIN 3.1* 04/07/2015 1328   ALBUMIN 3.1* 03/31/2015 1210   AST 11 04/07/2015 1328   AST 12 03/31/2015 1210   ALT 11 04/07/2015 1328   ALT 10 03/31/2015 1210   ALKPHOS 72 04/07/2015 1328   ALKPHOS 66 03/31/2015 1210   BILITOT 0.36 04/07/2015 1328   BILITOT 0.4 03/31/2015 1210   GFRNONAA 49* 03/31/2015 1210   GFRNONAA 51* 01/19/2011 0605   GFRAA 56* 03/31/2015 1210   GFRAA >60 01/19/2011 0605    No results found for: SPEP, UPEP  Lab Results  Component Value Date   WBC 6.9 04/07/2015   NEUTROABS 3.0 04/07/2015   HGB 10.2* 04/07/2015   HCT 33.6* 04/07/2015   MCV 78.1* 04/07/2015   PLT 68 Large & giant platelets* 04/07/2015      Chemistry      Component Value Date/Time   NA 139 04/07/2015 1328   NA 134* 03/31/2015 1210   K 3.1* 04/07/2015 1328   K 3.3* 03/31/2015 1210   CL 99 03/31/2015 1210   CL 107 09/27/2012 0940   CO2 28 04/07/2015 1328   CO2 27 03/31/2015 1210   BUN 11.9 04/07/2015 1328   BUN 14 03/31/2015 1210   CREATININE 1.4* 04/07/2015 1328   CREATININE 1.35* 03/31/2015 1210   CREATININE 1.64* 09/10/2011 1204      Component Value Date/Time   CALCIUM 8.1* 04/07/2015 1328   CALCIUM 8.1* 03/31/2015 1210   ALKPHOS 72 04/07/2015 1328   ALKPHOS 66 03/31/2015 1210   AST 11 04/07/2015 1328   AST 12 03/31/2015 1210   ALT 11 04/07/2015 1328   ALT 10 03/31/2015 1210   BILITOT 0.36 04/07/2015 1328   BILITOT 0.4 03/31/2015 1210     ASSESSMENT & PLAN:  MDS (myelodysplastic syndrome) with 5q deletion He is responding well to Promacta with some improvement of his blood counts. I will recommend he continue the same with  frequent blood draw. The aim is to keep platelet count above 50,000.  Pancytopenia ([place]) He has pancytopenia from MDS. With Promacta, his blood counts is improved. We will monitor carefully. He does not require blood transfusion.  Diarrhea The diarrhea is not related to side effects of Promacta. Recommend increase fluid hydration and to discontinue Reglan  Chronic renal failure, stage 3 (moderate) The patient have chronic kidney disease. His creatinine is stable. Continue medical management.   No orders of the defined types were placed in this encounter.   All questions were answered. The patient knows to call the clinic with any problems, questions or concerns. No barriers to learning was detected. I spent 20 minutes counseling the patient face to face. The total time spent in the appointment was 25 minutes and more than 50%  was on counseling and review of test results     [facility], Fayez Sturgell, MD 04/08/2015 4:34 PM

## 2015-04-08 NOTE — Assessment & Plan Note (Signed)
He has pancytopenia from MDS. With Promacta, his blood counts is improved. We will monitor carefully. He does not require blood transfusion.

## 2015-04-08 NOTE — Assessment & Plan Note (Signed)
He is responding well to [facility] with some improvement of his blood counts. I will recommend he continue the same with frequent blood draw. The aim is to keep platelet count above 50,000.

## 2015-04-08 NOTE — Assessment & Plan Note (Signed)
The diarrhea is not related to side effects of Promacta. Recommend increase fluid hydration and to discontinue Reglan

## 2015-04-08 NOTE — Assessment & Plan Note (Signed)
The patient have chronic kidney disease. His creatinine is stable. Continue medical management.

## 2015-04-09 LAB — PROTEIN ELECTROPHORESIS, URINE REFLEX
Albumin: 25.5 %
Alpha-1-Globulin, U: 18.5 %
Alpha-2-Globulin, U: 14.3 %
Beta Globulin, U: 21.7 %
Gamma Globulin, U: 20 %
TOTAL PROTEIN, URINE/DAY: 588 mg/d — AB (ref 50–100)
Total Protein, Urine: 49 mg/dL

## 2015-04-14 ENCOUNTER — Other Ambulatory Visit: Payer: Self-pay | Admitting: Hematology and Oncology

## 2015-04-14 ENCOUNTER — Other Ambulatory Visit (HOSPITAL_BASED_OUTPATIENT_CLINIC_OR_DEPARTMENT_OTHER): Payer: Medicare Other

## 2015-04-14 DIAGNOSIS — D46C Myelodysplastic syndrome with isolated del(5q) chromosomal abnormality: Secondary | ICD-10-CM | POA: Diagnosis present

## 2015-04-14 LAB — CBC WITH DIFFERENTIAL/PLATELET
BASO%: 0.2 % (ref 0.0–2.0)
Basophils Absolute: 0 10*3/uL (ref 0.0–0.1)
EOS%: 0.5 % (ref 0.0–7.0)
Eosinophils Absolute: 0 10*3/uL (ref 0.0–0.5)
HCT: 33.5 % — ABNORMAL LOW (ref 38.4–49.9)
HGB: 10 g/dL — ABNORMAL LOW (ref 13.0–17.1)
LYMPH#: 2.2 10*3/uL (ref 0.9–3.3)
LYMPH%: 38.2 % (ref 14.0–49.0)
MCH: 23.8 pg — ABNORMAL LOW (ref 27.2–33.4)
MCHC: 29.9 g/dL — AB (ref 32.0–36.0)
MCV: 79.6 fL (ref 79.3–98.0)
MONO#: 0.9 10*3/uL (ref 0.1–0.9)
MONO%: 15.8 % — AB (ref 0.0–14.0)
NEUT#: 2.6 10*3/uL (ref 1.5–6.5)
NEUT%: 45.3 % (ref 39.0–75.0)
PLATELETS: 41 10*3/uL — AB (ref 140–400)
RBC: 4.21 10*6/uL (ref 4.20–5.82)
RDW: 28.1 % — ABNORMAL HIGH (ref 11.0–14.6)
WBC: 5.7 10*3/uL (ref 4.0–10.3)

## 2015-04-14 LAB — TECHNOLOGIST REVIEW

## 2015-04-15 ENCOUNTER — Encounter: Payer: Self-pay | Admitting: Hematology and Oncology

## 2015-04-15 ENCOUNTER — Telehealth: Payer: Self-pay | Admitting: *Deleted

## 2015-04-15 NOTE — Telephone Encounter (Signed)
Pt states he feels better,  No further diarrhea.  No new problems.  Instructed to continue same dose Promacta per [name], one pill daily, and keep lab appt as scheduled next week.   Pt verbalized understanding.  He had question about his Co-pay assistance.   Informed him there is a note in EMR that our [place] is actually working on it this morning.   Apparently the form was faxed to the wrong number but Raquel gave them the correct fax number so hopefully we can complete the paperwork for pt soon.   He verbalized understanding.

## 2015-04-15 NOTE — Progress Notes (Signed)
Per Rocquel at [facility]-she is going to send phy form for [place] to fill out for his asst-- my attn and fax#. Message was left one was faxed on 10/4 and they have not recd back. They had the wrong fax#.

## 2015-04-15 NOTE — Progress Notes (Signed)
I placed form from [facility]f on desk of nurse for [name].

## 2015-04-15 NOTE — Telephone Encounter (Signed)
-----   Message from Heath Lark, MD sent at 04/14/2015  4:39 PM EDT ----- Regarding: how is he? Is diarrhea still bad? Platelet count dropped a little bit but I suspect from recent illness Continue same dose Promacta for another week

## 2015-04-17 ENCOUNTER — Encounter: Payer: Self-pay | Admitting: Family Medicine

## 2015-04-17 ENCOUNTER — Ambulatory Visit (INDEPENDENT_AMBULATORY_CARE_PROVIDER_SITE_OTHER): Payer: Medicare Other | Admitting: Family Medicine

## 2015-04-17 VITALS — BP 146/60 | HR 80 | Temp 97.4°F | Resp 16 | Ht 69.0 in | Wt 213.0 lb

## 2015-04-17 DIAGNOSIS — I2510 Atherosclerotic heart disease of native coronary artery without angina pectoris: Secondary | ICD-10-CM | POA: Diagnosis not present

## 2015-04-17 DIAGNOSIS — M353 Polymyalgia rheumatica: Secondary | ICD-10-CM

## 2015-04-17 DIAGNOSIS — E876 Hypokalemia: Secondary | ICD-10-CM

## 2015-04-17 MED ORDER — POTASSIUM CHLORIDE CRYS ER 20 MEQ PO TBCR: 20.0000 meq | EXTENDED_RELEASE_TABLET | Freq: Every day | ORAL | Status: DC

## 2015-04-17 NOTE — Progress Notes (Signed)
Subjective:    Patient ID: Ricardo Watkins, male    DOB: 02-Aug-1930, 81 y.o.   MRN: [id number]  HPI  10/17/14 Over the last 3 weeks, the patient has been developing severe headaches on the right side of his head. He is also having dizziness with standing. He denies any syncope or presyncope however he feels extremely lightheaded whenever he stands up. He has fallen on 2 separate occasions. The second time he struck his occiput. He denies any chest pain, palpitations. He denies any vertigo. Today on examination Dix-Hallpike maneuver is negative. However the headache is starting to worsen. It is constant. He denies any blurry vision. He denies any neurologic deficits. He does have a history of a stroke. He denies any strokelike symptoms. He denies any diplopia. He denies any memory loss or personality changes. However his dizziness with standing is worsening. His blood pressure has been running low recently at home between 101 115/40-50.  At that time, my plan was: I'm concerned that the patient may be getting dizzy and falling due to low blood pressure. Therefore I'm going to have the patient temporarily discontinue amlodipine as well as Flomax. I will recheck him here next week to see if his symptoms are improving. However given the fact that he has now developed a daily headache along with dizziness and he is recently had his head several times after falls, I will obtain a head CT without contrast to evaluate for subdural hematoma versus other intracranial pathology. Given his chronic kidney disease, I will not be able to use contrast. However we should be able to see any sign of bleeding without contrast. Patient's blood pressure is low. I will discontinue amlodipine. While he is here with a check a hemoglobin A1c along with a urine microalbumin to monitor his insulin-dependent diabetes mellitus. We may need to decrease his medication if his blood sugar is too tightly controlled. Given his age, and ideal  hemoglobin A1c will be between 7 and 8. I will also check a fasting lipid panel. Goal LDL cholesterol is less than 70 given his history of coronary artery disease as well as stroke. Patient is also having some chronic low back pain. This back pain has worsened after his fall yesterday. There is no tenderness to palpation over the spinous processes of the lumbar spine. The pain is more muscular in nature and Tylenol is not relieving his pain. Because of his chronic kidney disease he cannot take NSAIDs. Therefore I did give the patient hydrocodone/acetaminophen 5/325 one by mouth every 6 hours when necessary pain  10/24/14 Patient states he is doing much better. He has not fallen anymore. He is no longer having dizzy spells. Patient did have to resume the Flomax as he was having urinary retention without it. However he continues to refrain from using amlodipine. Unfortunately he saw his hematologist recently. His myelodysplasia is worsening. They're concerned about a reactivation of his non-Hodgkin's lymphoma. This scheduled the patient for a PET scan.  At that time, my plan was: Patient's dizziness seems to be related to his blood pressure. He feels much better since discontinuing amlodipine. His head CT showed no stroke, intracranial hemorrhage, or brain tumor. Therefore no further follow-up is necessary for this condition. I will eagerly await the results of his PET scan  01/24/15 Patient continues to complain of dizziness. He complains of poor balance with standing. His blood pressure at home has been low at times between 110 and 120/60-70. He is also on numerous  medications that can also affect his balance including oxybutynin, Flomax, and occasional pain medication. On examination today he has +1 pitting edema in both legs and faint bibasilar crackles. The patient is on Actos for diabetes.  At that time, my plan was: I have asked the patient to discontinue oxybutynin. I would like him to decrease Toprol-XL  to 50 mg by mouth daily. I would like to recheck in 2 weeks to see if his dizziness has improved. I will also schedule the patient for physical therapy to try to improve his strength, his coordination, and his balance. I will check a hemoglobin A1c and as long as his hemoglobin A1c is less than 8, I will discontinue Actos due to the swelling.  03/14/15 Here for follow up.  Patient's blood pressure is typically 160-170 over 90s in the night and early morning.  Throughout the day his blood pressure will drop after he takes his medication. He does feel better and less dizzy with the higher blood pressures. Unfortunately his platelet count has fallen to 40. He is working with the hematologist to treat this. He is on Plavix for coronary artery disease. However given his history of renal insufficiency, thrombocytopenia, I am very concerned about him having uncontrolled bleeding requiring hospitalization.  At that time, my plan was: Add clonidine 0.1 mg by mouth daily at bedtime. Recheck blood pressures in 2-3 weeks. Temporarily  Discontinue Plavix due to thrombocytopenia..  03/25/15 Patient feels poorly since I last saw him. He reports a dull pounding headache in the front of his head on a daily basis. He checks his blood pressure when he has a headache and it is elevated in the [id number] range systolic. He also reports nausea. He reports early satiety. He reports vomiting. Patient has a long-standing history of insulin-dependent diabetes mellitus. He reports weakness. He blames this on the flu shot although last time I did start the patient on clonidine. He is still having bowel movements. He is still passing flatus. Any fevers or chills. He denies any symptoms of a sinus infection. His blood pressure is fluctuating but typically ranges 140-170/90-100.  At that time, my plan was: I am concerned that the clonidine may be causing the patient to feel poorly. He can certainly cause fatigue, sleepiness, and a headache. I  am also concerned that his headache may be due to his uncontrolled blood pressure. Therefore I will discontinue clonidine. I will start the patient on boxes 074 mg by mouth daily to help treat his blood pressure. Patient cannot stop Flomax. However I will discontinue Flomax and hopefully doxazosin can help with his BPH symptoms. We will need to monitor closely for dizziness on this medication as he has recently been complaining of dizziness. I'm concerned that the nausea and vomiting may be due to some type of diabetic gastroparesis. I'll start the patient on Reglan 5 mg by mouth every before meals. Recheck the patient in one week or sooner if worse.  03/31/15 Patient's blood pressure has improved. His blood pressure is ranging 140-150/90. His nausea has improved although now he is developing diarrhea. He is still taking Reglan 3 times a day. Unfortunately he reports fevers and night sweats and chills. This is been going on now for the last few weeks. He also reports diffuse bone pain. He states he feels like the way he felt when he was diagnosed with non-Hodgkin's lymphoma. He does have a history of myelodysplastic syndrome. He also has thrombocytopenia.  At that time, my plan was:  While the patient's symptoms have improved which we saw him for the last week, I am concerned for diffuse bone pain and muscle pains and subjective fevers and chills and night sweats could be a sign that his myelodysplastic syndrome is activating into some type of bone marrow neoplasm. Therefore I will check a CBC today to monitor his white blood cell count.  Management decisions will be based upon the results of the patient's lab work.  I would also consider an SPEP/UPEP, and/or a skeletal survey of the areas of maximum tenderness.  04/17/15 SPEP and UPEP were within normal limits. There is no significant change in his CBC.  X-rays were obtained of his shoulders as well as his C-spine, T-spine, and L-spine. All showed arthritic  changes. However there are no significant lytic lesions or bony metastasis. Therefore I had the patient temporarily discontinue his statin medication and recheck today to see if his pain had improved. I was also concerned about possible polymyalgia rheumatica. Tentative plan was to try the patient on prednisone if the pain had not improved after discontinuing his statin. He is here today for recheck. Thankfully, since stopping his statin, he is 50-60% better and his diffuse muscle pains have improved dramatically. His lab work was significant for low potassium at 3.1. His platelet count remained low in the 40s. We are currently holding his antiplatelet medication because of his thrombocytopenia Past Medical History  Diagnosis Date  . PVD (peripheral vascular disease) ([place])     rt renal artery stent  . CAD (coronary artery disease)     30% LAD Stenosis, 70% ramus intermedius stenosis, treated with PTCA and angioplasty by Dr Albertine Patricia 2004  . DM (diabetes mellitus) ([place])   . Dyslipidemia   . Renal insufficiency   . Thrombocytopenia ([place])   . Hypertension   . Vitamin D deficiency   . Non Hodgkin's lymphoma ([place])   . Inguinal hernia     right  . Adenomatous colon polyp   . Cataract     right eye  . Hyperlipidemia   . Prostate cancer ([place])     seed implants no recurrence  . 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 Prostate CA ([place]) 09/10/2011    Gleason 3+3 R, 3+4 L lobe May 2007 Rx Radioactive seed implants Dr [name]  . DVT (deep venous thrombosis) ([place])     secondary to surgery  . UTI (urinary tract infection)   . 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 Deficiency anemia 04/19/2014  . Diverticulosis    Past Surgical History  Procedure Laterality Date  . Cystourethroscopy      ROBOTIC ARM  NUCLETRON SEED IMPLANTATION OF PROSTATE  . Exploratory laparotomy      For evaluation of lymphoma  . Colonoscopy    . Appendectomy      patient ?   Current Outpatient Prescriptions on File Prior to Visit  Medication Sig Dispense Refill  . acetaminophen (TYLENOL) 500 MG tablet Take 500 mg by mouth every 6 (six) hours as needed for pain.    . cyanocobalamin (,VITAMIN B-12,) 1000 MCG/ML injection Inject 1,000 mcg into the muscle every 30 (thirty) days.     Marland Kitchen doxazosin (CARDURA) 4 MG tablet Take 1 tablet (4 mg total) by mouth daily. [place]  tablet 5  . eltrombopag (PROMACTA) 50 MG tablet Take 1 tablet (50 mg total) by mouth daily. Take on an empty stomach 1 hour before a meal or 2 hours after 30 tablet 6  . HYDROcodone-acetaminophen (NORCO) 5-325 MG tablet Take 1 tablet by mouth every 6 (six) hours as needed for moderate pain. 30 tablet 0  . LANTUS SOLOSTAR 100 UNIT/ML Solostar Pen INJECT 25 UNITS INTO THE SKIN AT BEDTIME. 15 pen 3  . metoCLOPramide (REGLAN) 5 MG tablet Take 1 tablet (5 mg total) by mouth 3 (three) times daily before meals. 90 tablet 0  . metoprolol succinate (TOPROL-XL) 100 MG 24 hr tablet TAKE 1 TABLET DAILY WITH A MEAL OR IMMEDIATELY FOLLOWING A MEAL 90 tablet 3  . quinapril (ACCUPRIL) 20 MG tablet Take 20 mg by mouth daily.     No current facility-administered medications on file prior to visit.   Allergies  Allergen Reactions  . Glucophage [Metformin Hydrochloride]     Chest pain  . Zetia [Ezetimibe]     weakness  . Fenofibrate Rash  . Niacin-Lovastatin Er Rash   Social History   Social History  . Marital Status: Married    Spouse Name: N/A  . Number of Children: 4  . Years of Education: N/A   Occupational History  . retired    Social History Main Topics  . Smoking status: Former Smoker    Types: Cigarettes    Quit date: 06/21/1958  . Smokeless tobacco: Never Used     Comment: Quit 30 years ago. and use to smoke pipe but quit  . Alcohol Use: No  . Drug  Use: No  . Sexual Activity:    Partners: Female   Other Topics Concern  . Not on file   Social History Narrative     Review of Systems  All other systems reviewed and are negative.      Objective:   Physical Exam  Constitutional: He is oriented to person, place, and time. He appears well-developed and well-nourished.  HENT:  Nose: Nose normal.  Mouth/Throat: Oropharynx is clear and moist.  Neck: Neck supple.  Cardiovascular: Normal rate, regular rhythm and normal heart sounds.   No murmur heard. Pulmonary/Chest: Effort normal and breath sounds normal. No respiratory distress. He has no wheezes. He has no rales.  Abdominal: Soft. Bowel sounds are normal. He exhibits no distension. There is no tenderness. There is no rebound and no guarding.  Musculoskeletal: He exhibits no edema.  Lymphadenopathy:    He has no cervical adenopathy.  Neurological: He is alert and oriented to person, place, and time. He has normal reflexes. No cranial nerve deficit. He exhibits normal muscle tone. Coordination normal.  Vitals reviewed.         Assessment & Plan:  Low potassium syndrome - Plan: potassium chloride SA (K-DUR,KLOR-CON) 20 MEQ tablet  Polymyalgia (HCC)  At the present time, it appears the patient had statin-induced myopathy as a cause of his diffuse pain. Continue to hold the statin. Recheck a fasting lipid panel in January. His LDL cholesterol rises substantially above 100, I will try the patient on low-dose pravastatin. I will treat his potassium with K-Dur 20 milliequivalents by mouth daily for 1 week and then recheck a potassium level next week. Patient is currently having his platelets monitored at the cancer center. Once his platelet counts are above 50, I will have the patient resume his antiplatelet medication for prevention of cardiovascular disease.

## 2015-04-21 ENCOUNTER — Other Ambulatory Visit (HOSPITAL_BASED_OUTPATIENT_CLINIC_OR_DEPARTMENT_OTHER): Payer: Medicare Other

## 2015-04-21 DIAGNOSIS — D46C Myelodysplastic syndrome with isolated del(5q) chromosomal abnormality: Secondary | ICD-10-CM

## 2015-04-21 DIAGNOSIS — R197 Diarrhea, unspecified: Secondary | ICD-10-CM | POA: Diagnosis not present

## 2015-04-21 DIAGNOSIS — D61818 Other pancytopenia: Secondary | ICD-10-CM | POA: Diagnosis not present

## 2015-04-21 LAB — CBC WITH DIFFERENTIAL/PLATELET
BASO%: 0.3 % (ref 0.0–2.0)
Basophils Absolute: 0 10*3/uL (ref 0.0–0.1)
EOS%: 0.5 % (ref 0.0–7.0)
Eosinophils Absolute: 0 10*3/uL (ref 0.0–0.5)
HCT: 33.4 % — ABNORMAL LOW (ref 38.4–49.9)
HGB: 10.1 g/dL — ABNORMAL LOW (ref 13.0–17.1)
LYMPH%: 48.1 % (ref 14.0–49.0)
MCH: 23.9 pg — AB (ref 27.2–33.4)
MCHC: 30.2 g/dL — AB (ref 32.0–36.0)
MCV: 79 fL — ABNORMAL LOW (ref 79.3–98.0)
MONO#: 0.8 10*3/uL (ref 0.1–0.9)
MONO%: 19.8 % — AB (ref 0.0–14.0)
NEUT%: 31.3 % — AB (ref 39.0–75.0)
NEUTROS ABS: 1.2 10*3/uL — AB (ref 1.5–6.5)
PLATELETS: 39 10*3/uL — AB (ref 140–400)
RBC: 4.23 10*6/uL (ref 4.20–5.82)
RDW: 27.8 % — AB (ref 11.0–14.6)
WBC: 3.8 10*3/uL — AB (ref 4.0–10.3)
lymph#: 1.8 10*3/uL (ref 0.9–3.3)
nRBC: 1 % — ABNORMAL HIGH (ref 0–0)

## 2015-04-21 LAB — TECHNOLOGIST REVIEW: Technologist Review: 1

## 2015-04-22 ENCOUNTER — Telehealth: Payer: Self-pay | Admitting: *Deleted

## 2015-04-22 NOTE — Telephone Encounter (Signed)
-----   Message from Heath Lark, MD sent at 04/22/2015 12:32 PM EDT ----- Regarding: cbc Result is stable, continue the same ----- Message -----    From: [facility]    Sent: 04/21/2015   4:03 PM      To: Heath Lark, MD

## 2015-04-22 NOTE — Telephone Encounter (Signed)
Unable to reach.

## 2015-04-23 ENCOUNTER — Telehealth: Payer: Self-pay | Admitting: *Deleted

## 2015-04-23 NOTE — Telephone Encounter (Signed)
LVM for pt informing him of labs stable and to continue same dose Promacta.  Keep lab appt next week as scheduled and please call back if any questions or concerns.

## 2015-04-25 ENCOUNTER — Ambulatory Visit (INDEPENDENT_AMBULATORY_CARE_PROVIDER_SITE_OTHER): Payer: Medicare Other | Admitting: Family Medicine

## 2015-04-25 ENCOUNTER — Other Ambulatory Visit: Payer: Medicare Other

## 2015-04-25 DIAGNOSIS — D519 Vitamin B12 deficiency anemia, unspecified: Secondary | ICD-10-CM | POA: Diagnosis not present

## 2015-04-25 DIAGNOSIS — E876 Hypokalemia: Secondary | ICD-10-CM

## 2015-04-25 MED ORDER — CYANOCOBALAMIN 1000 MCG/ML IJ SOLN
1000.0000 ug | Freq: Once | INTRAMUSCULAR | Status: AC
  Administered 2015-04-25: 1000 ug via INTRAMUSCULAR

## 2015-04-26 LAB — BASIC METABOLIC PANEL
BUN: 19 mg/dL (ref 7–25)
CHLORIDE: 98 mmol/L (ref 98–110)
CO2: 26 mmol/L (ref 20–31)
CREATININE: 1.39 mg/dL — AB (ref 0.70–1.11)
Calcium: 8.3 mg/dL — ABNORMAL LOW (ref 8.6–10.3)
Glucose, Bld: 279 mg/dL — ABNORMAL HIGH (ref 70–99)
POTASSIUM: 4 mmol/L (ref 3.5–5.3)
SODIUM: 135 mmol/L (ref 135–146)

## 2015-04-28 ENCOUNTER — Telehealth: Payer: Self-pay | Admitting: Pharmacist

## 2015-04-28 ENCOUNTER — Other Ambulatory Visit (HOSPITAL_BASED_OUTPATIENT_CLINIC_OR_DEPARTMENT_OTHER): Payer: Medicare Other

## 2015-04-28 DIAGNOSIS — D46C Myelodysplastic syndrome with isolated del(5q) chromosomal abnormality: Secondary | ICD-10-CM | POA: Diagnosis present

## 2015-04-28 LAB — CBC WITH DIFFERENTIAL/PLATELET
BASO%: 0.2 % (ref 0.0–2.0)
Basophils Absolute: 0 10*3/uL (ref 0.0–0.1)
EOS%: 0.4 % (ref 0.0–7.0)
Eosinophils Absolute: 0 10*3/uL (ref 0.0–0.5)
HEMATOCRIT: 35.3 % — AB (ref 38.4–49.9)
HEMOGLOBIN: 10.7 g/dL — AB (ref 13.0–17.1)
LYMPH#: 2.6 10*3/uL (ref 0.9–3.3)
LYMPH%: 53.6 % — ABNORMAL HIGH (ref 14.0–49.0)
MCH: 24 pg — ABNORMAL LOW (ref 27.2–33.4)
MCHC: 30.3 g/dL — ABNORMAL LOW (ref 32.0–36.0)
MCV: 79.1 fL — ABNORMAL LOW (ref 79.3–98.0)
MONO#: 0.5 10*3/uL (ref 0.1–0.9)
MONO%: 10 % (ref 0.0–14.0)
NEUT%: 35.8 % — ABNORMAL LOW (ref 39.0–75.0)
NEUTROS ABS: 1.7 10*3/uL (ref 1.5–6.5)
NRBC: 0 % (ref 0–0)
Platelets: 72 10*3/uL — ABNORMAL LOW (ref 140–400)
RBC: 4.46 10*6/uL (ref 4.20–5.82)
RDW: 27.4 % — ABNORMAL HIGH (ref 11.0–14.6)
WBC: 4.8 10*3/uL (ref 4.0–10.3)

## 2015-04-28 LAB — TECHNOLOGIST REVIEW

## 2015-04-28 NOTE — Telephone Encounter (Signed)
.  Oral Chemotherapy Follow-Up Form  Original Start date of oral chemotherapy: __10/12/16_____   Called patient today to follow up regarding patient's oral chemotherapy medication: Promacta  Pt is doing well today with no complaints. He will be by this afternoon for lab appointments. He is due for a refill through [facility] and he will call them today or tomorrow for the fill. He has ~ 3 days left. He reports no side effects or missed doses. He takes his promacta on an empty stomach around 11 am (2-3 hours after breakfast and 1 hour before he eats lunch).   Pt reports __0__ tablets/doses missed in the last 2 weeks.    Other Issues: __N/A_______   Will follow up and call patient again in _2 weeks__   Thank you,  Montel Clock, PharmD, [facility]

## 2015-04-29 ENCOUNTER — Telehealth: Payer: Self-pay | Admitting: *Deleted

## 2015-04-29 NOTE — Telephone Encounter (Signed)
-----   Message from Heath Lark, MD sent at 04/29/2015  7:19 AM EST ----- Regarding: labs from yesterday CBC looks great Continue same dose promacta

## 2015-04-29 NOTE — Telephone Encounter (Signed)
Pt notified of message below.

## 2015-04-30 ENCOUNTER — Other Ambulatory Visit: Payer: Self-pay | Admitting: Hematology and Oncology

## 2015-04-30 ENCOUNTER — Other Ambulatory Visit: Payer: Self-pay | Admitting: *Deleted

## 2015-04-30 DIAGNOSIS — D696 Thrombocytopenia, unspecified: Secondary | ICD-10-CM

## 2015-04-30 MED ORDER — ELTROMBOPAG OLAMINE 50 MG PO TABS: 50.0000 mg | ORAL_TABLET | Freq: Every day | ORAL | Status: DC

## 2015-05-01 ENCOUNTER — Encounter: Payer: Self-pay | Admitting: Hematology and Oncology

## 2015-05-01 NOTE — Progress Notes (Signed)
Per [facility] promacta was shipped via [facility]

## 2015-05-05 ENCOUNTER — Telehealth: Payer: Self-pay | Admitting: Hematology and Oncology

## 2015-05-05 ENCOUNTER — Ambulatory Visit (HOSPITAL_BASED_OUTPATIENT_CLINIC_OR_DEPARTMENT_OTHER): Payer: Medicare Other | Admitting: Hematology and Oncology

## 2015-05-05 ENCOUNTER — Other Ambulatory Visit (HOSPITAL_BASED_OUTPATIENT_CLINIC_OR_DEPARTMENT_OTHER): Payer: Medicare Other

## 2015-05-05 ENCOUNTER — Telehealth: Payer: Self-pay | Admitting: Family Medicine

## 2015-05-05 ENCOUNTER — Encounter: Payer: Self-pay | Admitting: Hematology and Oncology

## 2015-05-05 VITALS — BP 136/59 | HR 90 | Temp 97.9°F | Resp 18 | Ht 69.0 in | Wt 202.5 lb

## 2015-05-05 DIAGNOSIS — D46C Myelodysplastic syndrome with isolated del(5q) chromosomal abnormality: Secondary | ICD-10-CM | POA: Diagnosis not present

## 2015-05-05 DIAGNOSIS — D61818 Other pancytopenia: Secondary | ICD-10-CM | POA: Diagnosis not present

## 2015-05-05 LAB — CBC WITH DIFFERENTIAL/PLATELET
BASO%: 0.2 % (ref 0.0–2.0)
Basophils Absolute: 0 10*3/uL (ref 0.0–0.1)
EOS ABS: 0 10*3/uL (ref 0.0–0.5)
EOS%: 0.3 % (ref 0.0–7.0)
HEMATOCRIT: 34 % — AB (ref 38.4–49.9)
HEMOGLOBIN: 10.3 g/dL — AB (ref 13.0–17.1)
LYMPH%: 35.1 % (ref 14.0–49.0)
MCH: 23.9 pg — ABNORMAL LOW (ref 27.2–33.4)
MCHC: 30.3 g/dL — AB (ref 32.0–36.0)
MCV: 78.9 fL — ABNORMAL LOW (ref 79.3–98.0)
MONO#: 0.7 10*3/uL (ref 0.1–0.9)
MONO%: 10.5 % (ref 0.0–14.0)
NEUT%: 53.9 % (ref 39.0–75.0)
NEUTROS ABS: 3.4 10*3/uL (ref 1.5–6.5)
NRBC: 2 % — AB (ref 0–0)
PLATELETS: 74 10*3/uL — AB (ref 140–400)
RBC: 4.31 10*6/uL (ref 4.20–5.82)
RDW: 27.3 % — AB (ref 11.0–14.6)
WBC: 6.2 10*3/uL (ref 4.0–10.3)
lymph#: 2.2 10*3/uL (ref 0.9–3.3)

## 2015-05-05 LAB — TECHNOLOGIST REVIEW

## 2015-05-05 NOTE — Assessment & Plan Note (Signed)
He tolerated treatment well. He had resolution of leukopenia and the platelet count has improved well over 50,000. Anemia has remained the same as it is multifactorial. The patient also has anemia of chronic kidney disease. Overall, he is doing well and I plan to see him back in 2 months for repeat history, physical examination and blood work.

## 2015-05-05 NOTE — Assessment & Plan Note (Signed)
As above, he is responding to low-dose Promacta. We will continue to same. With platelet count consistently above 50,000, I have asked him to resume his anticoagulation therapy.

## 2015-05-05 NOTE — Progress Notes (Signed)
[facility] OFFICE PROGRESS NOTE  Patient Care Team: [name], MD as PCP - General (Family Medicine) Heath Lark, MD as Consulting Physician (Hematology and Oncology)  SUMMARY OF ONCOLOGIC HISTORY: Oncology History   Calculated R-IPSS score at low risk     MDS (myelodysplastic syndrome) with 5q deletion ([place])   11/07/2014 Bone Marrow Biopsy Accession: [id number] marrow biopsy showed myelodysplastic syndrome with 5q deletion.   04/02/2015 -  Chemotherapy He started taking Promacta daily for thrombocytopenia    INTERVAL HISTORY: Please see below for problem oriented charting. He is seen today. He complained of mild nasal drainage and sore throat but denies fevers or chills. He has occasional night sweats. The patient denies any recent signs or symptoms of bleeding such as spontaneous epistaxis, hematuria or hematochezia.   REVIEW OF SYSTEMS:   Constitutional: Denies fevers, chills or abnormal weight loss Eyes: Denies blurriness of vision Respiratory: Denies cough, dyspnea or wheezes Cardiovascular: Denies palpitation, chest discomfort or lower extremity swelling Gastrointestinal:  Denies nausea, heartburn or change in bowel habits Skin: Denies abnormal skin rashes Lymphatics: Denies new lymphadenopathy or easy bruising Neurological:Denies numbness, tingling or new weaknesses Behavioral/Psych: Mood is stable, no new changes  All other systems were reviewed with the patient and are negative.  I have reviewed the past medical history, past surgical history, social history and family history with the patient and they are unchanged from previous note.  ALLERGIES:  is allergic to glucophage; zetia; fenofibrate; and niacin-lovastatin er.  MEDICATIONS:  Current Outpatient Prescriptions  Medication Sig Dispense Refill  . acetaminophen (TYLENOL) 500 MG tablet Take 500 mg by mouth every 6 (six) hours as needed for pain.    . cyanocobalamin (,VITAMIN B-12,) 1000  MCG/ML injection Inject 1,000 mcg into the muscle every 30 (thirty) days.     Marland Kitchen doxazosin (CARDURA) 4 MG tablet Take 1 tablet (4 mg total) by mouth daily. 30 tablet 5  . eltrombopag (PROMACTA) 50 MG tablet Take 1 tablet (50 mg total) by mouth daily. Take on an empty stomach 1 hour before a meal or 2 hours after 30 tablet 6  . HYDROcodone-acetaminophen (NORCO) 5-325 MG tablet Take 1 tablet by mouth every 6 (six) hours as needed for moderate pain. 30 tablet 0  . LANTUS SOLOSTAR 100 UNIT/ML Solostar Pen INJECT 25 UNITS INTO THE SKIN AT BEDTIME. 15 pen 3  . metoCLOPramide (REGLAN) 5 MG tablet Take 1 tablet (5 mg total) by mouth 3 (three) times daily before meals. 90 tablet 0  . metoprolol succinate (TOPROL-XL) 100 MG 24 hr tablet TAKE 1 TABLET DAILY WITH A MEAL OR IMMEDIATELY FOLLOWING A MEAL 90 tablet 3  . potassium chloride SA (K-DUR,KLOR-CON) 20 MEQ tablet Take 1 tablet (20 mEq total) by mouth daily. 30 tablet 0  . quinapril (ACCUPRIL) 40 MG tablet Take 40 mg by mouth daily.  0   No current facility-administered medications for this visit.    PHYSICAL EXAMINATION: ECOG PERFORMANCE STATUS: 1 - Symptomatic but completely ambulatory  Filed Vitals:   05/05/15 1505  BP: 136/59  Pulse: 90  Temp: 97.9 F (36.6 C)  Resp: 18   Filed Weights   05/05/15 1505  Weight: 202 lb 8 oz (91.853 kg)    GENERAL:alert, no distress and comfortable SKIN: skin color, texture, turgor are normal, no rashes or significant lesions. Noted skin bruises EYES: normal, Conjunctiva are pink and non-injected, sclera clear OROPHARYNX:no exudate, no erythema and lips, buccal mucosa, and tongue normal  NECK: supple, thyroid  normal size, non-tender, without nodularity LYMPH:  no palpable lymphadenopathy in the cervical, axillary or inguinal LUNGS: clear to auscultation and percussion with normal breathing effort HEART: regular rate & rhythm and no murmurs and no lower extremity edema ABDOMEN:abdomen soft, non-tender and  normal bowel sounds Musculoskeletal:no cyanosis of digits and no clubbing  NEURO: alert & oriented x 3 with fluent speech, no focal motor/sensory deficits  LABORATORY DATA:  I have reviewed the data as listed    Component Value Date/Time   NA 135 04/25/2015 1439   NA 139 04/07/2015 1328   K 4.0 04/25/2015 1439   K 3.1* 04/07/2015 1328   CL 98 04/25/2015 1439   CL 107 09/27/2012 0940   CO2 26 04/25/2015 1439   CO2 28 04/07/2015 1328   GLUCOSE 279* 04/25/2015 1439   GLUCOSE 210* 04/07/2015 1328   GLUCOSE 89 09/27/2012 0940   BUN 19 04/25/2015 1439   BUN 11.9 04/07/2015 1328   CREATININE 1.39* 04/25/2015 1439   CREATININE 1.4* 04/07/2015 1328   CREATININE 1.64* 09/10/2011 1204   CALCIUM 8.3* 04/25/2015 1439   CALCIUM 8.1* 04/07/2015 1328   PROT 7.2 04/07/2015 1328   PROT 6.2 03/31/2015 1210   ALBUMIN 3.1* 04/07/2015 1328   ALBUMIN 3.1* 03/31/2015 1210   AST 11 04/07/2015 1328   AST 12 03/31/2015 1210   ALT 11 04/07/2015 1328   ALT 10 03/31/2015 1210   ALKPHOS 72 04/07/2015 1328   ALKPHOS 66 03/31/2015 1210   BILITOT 0.36 04/07/2015 1328   BILITOT 0.4 03/31/2015 1210   GFRNONAA 49* 03/31/2015 1210   GFRNONAA 51* 01/19/2011 0605   GFRAA 56* 03/31/2015 1210   GFRAA >60 01/19/2011 0605    No results found for: SPEP, UPEP  Lab Results  Component Value Date   WBC 6.2 05/05/2015   NEUTROABS 3.4 05/05/2015   HGB 10.3* 05/05/2015   HCT 34.0* 05/05/2015   MCV 78.9* 05/05/2015   PLT 74* 05/05/2015      Chemistry      Component Value Date/Time   NA 135 04/25/2015 1439   NA 139 04/07/2015 1328   K 4.0 04/25/2015 1439   K 3.1* 04/07/2015 1328   CL 98 04/25/2015 1439   CL 107 09/27/2012 0940   CO2 26 04/25/2015 1439   CO2 28 04/07/2015 1328   BUN 19 04/25/2015 1439   BUN 11.9 04/07/2015 1328   CREATININE 1.39* 04/25/2015 1439   CREATININE 1.4* 04/07/2015 1328   CREATININE 1.64* 09/10/2011 1204      Component Value Date/Time   CALCIUM 8.3* 04/25/2015 1439    CALCIUM 8.1* 04/07/2015 1328   ALKPHOS 72 04/07/2015 1328   ALKPHOS 66 03/31/2015 1210   AST 11 04/07/2015 1328   AST 12 03/31/2015 1210   ALT 11 04/07/2015 1328   ALT 10 03/31/2015 1210   BILITOT 0.36 04/07/2015 1328   BILITOT 0.4 03/31/2015 1210      ASSESSMENT & PLAN:  MDS (myelodysplastic syndrome) with 5q deletion He tolerated treatment well. He had resolution of leukopenia and the platelet count has improved well over 50,000. Anemia has remained the same as it is multifactorial. The patient also has anemia of chronic kidney disease. Overall, he is doing well and I plan to see him back in 2 months for repeat history, physical examination and blood work.  Pancytopenia ([place]) As above, he is responding to low-dose Promacta. We will continue to same. With platelet count consistently above 50,000, I have asked him to resume his anticoagulation  therapy.   No orders of the defined types were placed in this encounter.   All questions were answered. The patient knows to call the clinic with any problems, questions or concerns. No barriers to learning was detected. I spent 15 minutes counseling the patient face to face. The total time spent in the appointment was 20 minutes and more than 50% was on counseling and review of test results     [facility], [place], MD 05/05/2015 3:41 PM

## 2015-05-05 NOTE — Telephone Encounter (Signed)
per pof to sch pt appt-gave pt copy of avs °

## 2015-05-05 NOTE — Telephone Encounter (Signed)
Patients daughter called states patient went to see his oncologist today and that his platelets and blood count is higher and he can now go back on his blood thinners.

## 2015-05-08 ENCOUNTER — Ambulatory Visit (INDEPENDENT_AMBULATORY_CARE_PROVIDER_SITE_OTHER): Payer: Medicare Other | Admitting: Physician Assistant

## 2015-05-08 ENCOUNTER — Ambulatory Visit (HOSPITAL_COMMUNITY)
Admission: RE | Admit: 2015-05-08 | Discharge: 2015-05-08 | Disposition: A | Discharge status: Home / Self Care | Payer: Medicare Other | Source: Ambulatory Visit | Attending: Cardiology | Admitting: Cardiology

## 2015-05-08 ENCOUNTER — Encounter: Payer: Self-pay | Admitting: Physician Assistant

## 2015-05-08 VITALS — BP 146/72 | HR 100 | Temp 98.0°F | Resp 20 | Wt 198.0 lb

## 2015-05-08 DIAGNOSIS — I2510 Atherosclerotic heart disease of native coronary artery without angina pectoris: Secondary | ICD-10-CM | POA: Insufficient documentation

## 2015-05-08 DIAGNOSIS — E119 Type 2 diabetes mellitus without complications: Secondary | ICD-10-CM | POA: Insufficient documentation

## 2015-05-08 DIAGNOSIS — J029 Acute pharyngitis, unspecified: Secondary | ICD-10-CM | POA: Diagnosis not present

## 2015-05-08 DIAGNOSIS — I739 Peripheral vascular disease, unspecified: Secondary | ICD-10-CM | POA: Diagnosis not present

## 2015-05-08 DIAGNOSIS — I6523 Occlusion and stenosis of bilateral carotid arteries: Secondary | ICD-10-CM | POA: Insufficient documentation

## 2015-05-08 DIAGNOSIS — I10 Essential (primary) hypertension: Secondary | ICD-10-CM | POA: Insufficient documentation

## 2015-05-08 DIAGNOSIS — E785 Hyperlipidemia, unspecified: Secondary | ICD-10-CM | POA: Diagnosis not present

## 2015-05-08 LAB — RAPID STREP SCREEN (MED CTR MEBANE ONLY): Streptococcus, Group A Screen (Direct): NEGATIVE

## 2015-05-08 MED ORDER — AMOXICILLIN-POT CLAVULANATE 875-125 MG PO TABS: 1.0000 | ORAL_TABLET | Freq: Two times a day (BID) | ORAL | Status: DC

## 2015-05-08 NOTE — Progress Notes (Signed)
Patient ID: Ricardo Watkins MRN: [id number], DOB: September 15, 1930, 81 y.o. Date of Encounter: 05/08/2015, 3:08 PM    Chief Complaint:  Chief Complaint  Patient presents with  . sick x 1 week    severe sore throat     HPI: 81 y.o. year old white male here with his wife. He states that his throat has been sore and feels raw for the past week. Says he really has not had any nasal congestion and not getting any mucus from his nose. Says he is not having any chest congestion or significant cough. No known fevers or chills.     Home Meds:   Outpatient Prescriptions Prior to Visit  Medication Sig Dispense Refill  . acetaminophen (TYLENOL) 500 MG tablet Take 500 mg by mouth every 6 (six) hours as needed for pain.    . cyanocobalamin (,VITAMIN B-12,) 1000 MCG/ML injection Inject 1,000 mcg into the muscle every 30 (thirty) days.     Marland Kitchen doxazosin (CARDURA) 4 MG tablet Take 1 tablet (4 mg total) by mouth daily. 30 tablet 5  . eltrombopag (PROMACTA) 50 MG tablet Take 1 tablet (50 mg total) by mouth daily. Take on an empty stomach 1 hour before a meal or 2 hours after 30 tablet 6  . HYDROcodone-acetaminophen (NORCO) 5-325 MG tablet Take 1 tablet by mouth every 6 (six) hours as needed for moderate pain. 30 tablet 0  . LANTUS SOLOSTAR 100 UNIT/ML Solostar Pen INJECT 25 UNITS INTO THE SKIN AT BEDTIME. 15 pen 3  . metoCLOPramide (REGLAN) 5 MG tablet Take 1 tablet (5 mg total) by mouth 3 (three) times daily before meals. 90 tablet 0  . metoprolol succinate (TOPROL-XL) 100 MG 24 hr tablet TAKE 1 TABLET DAILY WITH A MEAL OR IMMEDIATELY FOLLOWING A MEAL 90 tablet 3  . potassium chloride SA (K-DUR,KLOR-CON) 20 MEQ tablet Take 1 tablet (20 mEq total) by mouth daily. 30 tablet 0  . quinapril (ACCUPRIL) 40 MG tablet Take 40 mg by mouth daily.  0   No facility-administered medications prior to visit.    Allergies:  Allergies  Allergen Reactions  . Glucophage [Metformin Hydrochloride]     Chest pain  . Zetia  [Ezetimibe]     weakness  . Fenofibrate Rash  . Niacin-Lovastatin Er Rash      Review of Systems: See HPI for pertinent ROS. All other ROS negative.    Physical Exam: Blood pressure 146/72, pulse 100, temperature 98 F (36.7 C), temperature source Oral, resp. rate 20, weight 198 lb (89.812 kg)., Body mass index is 29.23 kg/(m^2). General:  WNWD WM. Appears in no acute distress. HEENT: Normocephalic, atraumatic, eyes without discharge, sclera non-icteric, nares are without discharge. Bilateral auditory canals clear, TM's are without perforation, pearly grey and translucent with reflective cone of light bilaterally. Oral cavity moist. Tongue and posterior pharynx and tonsils are all a pinkish-red color secondary to recent use of lozenge. Therefore inspection/exam suboptimal.  No exudate visualized. No. Tonsillar abscess visualized.  Neck: Supple. No thyromegaly. No lymphadenopathy. Lungs: Clear bilaterally to auscultation without wheezes, rales, or rhonchi. Breathing is unlabored. Heart: Irregular with ectopy. Msk:  Strength and tone normal for age. Extremities/Skin: Warm and dry. Neuro: Alert and oriented X 3. Moves all extremities spontaneously. Gait is normal. CNII-XII grossly in tact. Psych:  Responds to questions appropriately with a normal affect.   Results for orders placed or performed in visit on 05/08/15  Rapid strep screen (not at [facility])  Result Value Ref Range  Source THROAT    Streptococcus, Group A Screen (Direct) NEG NEGATIVE     ASSESSMENT AND PLAN:  81 y.o. year old male with  1. Acute pharyngitis, unspecified etiology He is to start Augmentin immediately, take as directed, complete all of it. He can continue to use lozenges spray etc. for symptom relief in the interim. If sore throat does not resolve upon completion of Augmentin, follow-up with [place]. - amoxicillin-clavulanate (AUGMENTIN) 875-125 MG tablet; Take 1 tablet by mouth 2 (two) times daily.  Dispense: 20  tablet; Refill: 0  2. Sorethroat - Rapid strep screen (not at [facility])   Signed, [facility] [place], [place], [facility] 05/08/2015 3:08 PM

## 2015-05-12 ENCOUNTER — Encounter: Payer: Self-pay | Admitting: Physician Assistant

## 2015-05-12 ENCOUNTER — Ambulatory Visit (INDEPENDENT_AMBULATORY_CARE_PROVIDER_SITE_OTHER): Payer: Medicare Other | Admitting: Physician Assistant

## 2015-05-12 VITALS — BP 108/60 | HR 80 | Temp 97.5°F | Resp 18 | Wt 198.0 lb

## 2015-05-12 DIAGNOSIS — I2510 Atherosclerotic heart disease of native coronary artery without angina pectoris: Secondary | ICD-10-CM | POA: Diagnosis not present

## 2015-05-12 DIAGNOSIS — J039 Acute tonsillitis, unspecified: Secondary | ICD-10-CM

## 2015-05-12 DIAGNOSIS — J029 Acute pharyngitis, unspecified: Secondary | ICD-10-CM

## 2015-05-12 MED ORDER — CEFTRIAXONE SODIUM 1 G IJ SOLR
1.0000 g | Freq: Once | INTRAMUSCULAR | Status: AC
  Administered 2015-05-12: 1 g via INTRAMUSCULAR

## 2015-05-12 NOTE — Progress Notes (Signed)
Patient ID: TYTUS WILLINGER MRN: [id number], DOB: 06-Jun-1931, 81 y.o. Date of Encounter: 05/12/2015, 4:13 PM    Chief Complaint:  Chief Complaint  Patient presents with  . says no better    says throat is worse     HPI: 81 y.o. year old white male here with his wife. Reviewed his OV note with me 05/08/2015: At that [place], he was here with his wife also. The following is copied from that [place] note: " He states that his throat has been sore and feels raw for the past week. Says he really has not had any nasal congestion and not getting any mucus from his nose. Says he is not having any chest congestion or significant cough. No known fevers or chills."  Results for orders placed or performed in visit on 05/08/15  Rapid strep screen (not at [facility])  Result Value Ref Range   Source THROAT    Streptococcus, Group A Screen (Direct) NEG NEGATIVE   ASSESSMENT AND PLAN:  81 y.o. year old male with  1. Acute pharyngitis, unspecified etiology He is to start Augmentin immediately, take as directed, complete all of it. He can continue to use lozenges spray etc. for symptom relief in the interim. If sore throat does not resolve upon completion of Augmentin, follow-up with [place]. - amoxicillin-clavulanate (AUGMENTIN) 875-125 MG tablet; Take 1 tablet by mouth 2 (two) times daily.  Dispense: 20 tablet; Refill: 0  TODAY---05/12/2015: Today patient and wife report that he is taking the Augmentin as directed but his throat is not improving and actually feels like it is getting worse. He says that when he swallows it feels like a brush scrubbing down his throat. Therefore he is not eating. Using Chloraseptic spray which provides some relief temporarily. No fevers or chills. No other complaints or concerns today. Still no nasal congestion or mucus from the nose. Still no chest congestion or cough.    Home Meds:   Outpatient Prescriptions Prior to Visit  Medication Sig Dispense Refill  . acetaminophen  (TYLENOL) 500 MG tablet Take 500 mg by mouth every 6 (six) hours as needed for pain.    Marland Kitchen amoxicillin-clavulanate (AUGMENTIN) 875-125 MG tablet Take 1 tablet by mouth 2 (two) times daily. 20 tablet 0  . cyanocobalamin (,VITAMIN B-12,) 1000 MCG/ML injection Inject 1,000 mcg into the muscle every 30 (thirty) days.     Marland Kitchen doxazosin (CARDURA) 4 MG tablet Take 1 tablet (4 mg total) by mouth daily. 30 tablet 5  . eltrombopag (PROMACTA) 50 MG tablet Take 1 tablet (50 mg total) by mouth daily. Take on an empty stomach 1 hour before a meal or 2 hours after 30 tablet 6  . HYDROcodone-acetaminophen (NORCO) 5-325 MG tablet Take 1 tablet by mouth every 6 (six) hours as needed for moderate pain. 30 tablet 0  . LANTUS SOLOSTAR 100 UNIT/ML Solostar Pen INJECT 25 UNITS INTO THE SKIN AT BEDTIME. 15 pen 3  . metoCLOPramide (REGLAN) 5 MG tablet Take 1 tablet (5 mg total) by mouth 3 (three) times daily before meals. 90 tablet 0  . metoprolol succinate (TOPROL-XL) 100 MG 24 hr tablet TAKE 1 TABLET DAILY WITH A MEAL OR IMMEDIATELY FOLLOWING A MEAL 90 tablet 3  . potassium chloride SA (K-DUR,KLOR-CON) 20 MEQ tablet Take 1 tablet (20 mEq total) by mouth daily. 30 tablet 0  . quinapril (ACCUPRIL) 40 MG tablet Take 40 mg by mouth daily.  0   No facility-administered medications prior to visit.  Allergies:  Allergies  Allergen Reactions  . Glucophage [Metformin Hydrochloride]     Chest pain  . Zetia [Ezetimibe]     weakness  . Fenofibrate Rash  . Niacin-Lovastatin Er Rash      Review of Systems: See HPI for pertinent ROS. All other ROS negative.    Physical Exam: Blood pressure 108/60, pulse 80, temperature 97.5 F (36.4 C), resp. rate 18, weight 198 lb (89.812 kg)., Body mass index is 29.23 kg/(m^2). General:  WNWD WM. Appears in no acute distress. HEENT: Normocephalic, atraumatic, eyes without discharge, sclera non-icteric, nares are without discharge. Bilateral auditory canals clear, TM's are without  perforation, pearly grey and translucent with reflective cone of light bilaterally. Oral cavity moist.  Posterior pharynx with significant erythema, especially on left side. Looks like a small peritonsillar abscess may be forming on the left side. He has severe gag reflex so exam somewhat difficult but repeated exam multiple times to visualize as much as possible.  Neck: Supple. No thyromegaly. Left > Right nodes tender, slightly enlarged.  Lungs: Clear bilaterally to auscultation without wheezes, rales, or rhonchi. Breathing is unlabored. Heart: Irregular with ectopy. Msk:  Strength and tone normal for age. Extremities/Skin: Warm and dry. Neuro: Alert and oriented X 3. Moves all extremities spontaneously. Gait is normal. CNII-XII grossly in tact. Psych:  Responds to questions appropriately with a normal affect.   Results for orders placed or performed in visit on 05/08/15  Rapid strep screen (not at [facility])  Result Value Ref Range   Source THROAT    Streptococcus, Group A Screen (Direct) NEG NEGATIVE     ASSESSMENT AND PLAN:  81 y.o. year old male with   1. Acute pharyngitis, unspecified etiology He is to continue the Augmentin as directed. Will give Rocephin 1 g IM now. Will get him in with ENT as soon as possible.---This appointment was scheduled prior to patient and his wife leaving the office and he is instructed to see ENT tomorrow and he is given the appointment time and location. Discussed indications to go to ER. O/W f/u with ENT tomorrow. Patient and wife voice understanding and agree.  - Ambulatory referral to ENT - cefTRIAXone (ROCEPHIN) injection 1 g; Inject 1 g into the muscle once.  2. Acute tonsillitis, unspecified etiology - Ambulatory referral to ENT - cefTRIAXone (ROCEPHIN) injection 1 g; Inject 1 g into the muscle once.   [address] [place], [place], [facility] 05/12/2015 4:13 PM

## 2015-05-12 NOTE — Telephone Encounter (Signed)
I agree, go back on blood thinner.

## 2015-05-13 ENCOUNTER — Telehealth: Payer: Self-pay | Admitting: *Deleted

## 2015-05-13 ENCOUNTER — Encounter: Payer: Self-pay | Admitting: Pharmacist

## 2015-05-13 DIAGNOSIS — R070 Pain in throat: Secondary | ICD-10-CM | POA: Diagnosis not present

## 2015-05-13 DIAGNOSIS — K1230 Oral mucositis (ulcerative), unspecified: Secondary | ICD-10-CM | POA: Diagnosis not present

## 2015-05-13 DIAGNOSIS — I2510 Atherosclerotic heart disease of native coronary artery without angina pectoris: Secondary | ICD-10-CM

## 2015-05-13 NOTE — Telephone Encounter (Signed)
Carotid doppler was ordered for 1 year

## 2015-05-13 NOTE — Progress Notes (Signed)
Oral Chemotherapy Follow-Up Form  Original Start date of oral chemotherapy: __10/12/16_____   Called patient today to follow up regarding patient's oral chemotherapy medication: Promacta  Pt is doing well today with no complaints. He receives Pharmacist, community through [facility], dispensing. He reports no side effects or missed doses. He takes his promacta on an empty stomach around 11 am (2-3 hours after breakfast and 1 hour before he eats lunch). He is doing great with no complaints. He does have a sore throat (strep test negative) and he is seeing his PCP for this today.   Pt reports __0__ tablets/doses missed in the last 2 weeks.    Other Issues: __N/A_______   Will follow up and call patient again in _4 weeks__   Thank you,  Montel Clock, PharmD, [facility]

## 2015-05-13 NOTE — Telephone Encounter (Signed)
-----   Message from [name], MD sent at 109/20/202016  7:54 PM EST ----- Moderate nonobstructive disease.  Follow up in one year.

## 2015-05-14 ENCOUNTER — Encounter: Payer: Self-pay | Admitting: Family Medicine

## 2015-05-14 ENCOUNTER — Encounter: Payer: Self-pay | Admitting: Skilled Nursing Facility1

## 2015-05-14 NOTE — Progress Notes (Signed)
Subjective:     Patient ID: Ricardo Watkins, male   DOB: 17-Jun-1931, 81 y.o.   MRN: [id number]  HPI   Review of Systems     Objective:   Physical Exam To assist the pt in identifying some dietary strategies to gain lost wt back.     Assessment:     Pt identified as being malnourished due to lost wt. Pt was contacted via the telephone at [phone]. Pt states he has lost 15 pounds and states he is eating pretty well. Pt states cold foods bothers his throat.     Plan:     Dietitian advised he leave is food out on the counter for a few minutes to ensure it is room temperature which may be better tolerated as well as storing his ensure in the pantry rather than the refrigerator.   Pt states he does not want any information sent to him.

## 2015-05-23 ENCOUNTER — Ambulatory Visit (INDEPENDENT_AMBULATORY_CARE_PROVIDER_SITE_OTHER): Payer: Medicare Other | Admitting: Family Medicine

## 2015-05-23 DIAGNOSIS — D519 Vitamin B12 deficiency anemia, unspecified: Secondary | ICD-10-CM

## 2015-05-23 MED ORDER — CYANOCOBALAMIN 1000 MCG/ML IJ SOLN
1000.0000 ug | Freq: Once | INTRAMUSCULAR | Status: AC
Start: 2015-05-23 — End: 2015-05-23
  Administered 2015-05-23: 1000 ug via INTRAMUSCULAR

## 2015-05-26 ENCOUNTER — Ambulatory Visit: Payer: Medicare Other

## 2015-05-27 ENCOUNTER — Ambulatory Visit (INDEPENDENT_AMBULATORY_CARE_PROVIDER_SITE_OTHER): Payer: Medicare Other | Admitting: Podiatry

## 2015-05-27 ENCOUNTER — Encounter: Payer: Self-pay | Admitting: Podiatry

## 2015-05-27 DIAGNOSIS — B351 Tinea unguium: Secondary | ICD-10-CM

## 2015-05-27 DIAGNOSIS — M79676 Pain in unspecified toe(s): Secondary | ICD-10-CM | POA: Diagnosis not present

## 2015-05-27 DIAGNOSIS — H903 Sensorineural hearing loss, bilateral: Secondary | ICD-10-CM | POA: Diagnosis not present

## 2015-05-27 DIAGNOSIS — J039 Acute tonsillitis, unspecified: Secondary | ICD-10-CM | POA: Diagnosis not present

## 2015-05-27 NOTE — Progress Notes (Signed)
Patient ID: Ricardo Watkins, male   DOB: 1931-01-18, 81 y.o.   MRN: [id number]  Subjective: This patient presents for scheduled visit and request debridement of painful toenails which are uncomfortable when walking wearing shoes  Objective: No open skin lesions bilaterally The toenails are hypertrophic, elongated, incurvated, discolored and tender direct palpation 6-10  Assessment: Diabetic Symptomatic onychomycoses 6-10  Plan: Debridement toenails 10 and mechanically and electrically without any bleeding  Reappoint 3 month

## 2015-05-27 NOTE — Patient Instructions (Signed)
Diabetes and Foot Care Diabetes may cause you to have problems because of poor blood supply (circulation) to your feet and legs. This may cause the skin on your feet to become thinner, break easier, and heal more slowly. Your skin may become dry, and the skin may peel and crack. You may also have nerve damage in your legs and feet causing decreased feeling in them. You may not notice minor injuries to your feet that could lead to infections or more serious problems. Taking care of your feet is one of the most important things you can do for yourself.  HOME CARE INSTRUCTIONS  Wear shoes at all times, even in the house. Do not go barefoot. Bare feet are easily injured.  Check your feet daily for blisters, cuts, and redness. If you cannot see the bottom of your feet, use a mirror or ask someone for help.  Wash your feet with warm water (do not use hot water) and mild soap. Then pat your feet and the areas between your toes until they are completely dry. Do not soak your feet as this can dry your skin.  Apply a moisturizing lotion or petroleum jelly (that does not contain alcohol and is unscented) to the skin on your feet and to dry, brittle toenails. Do not apply lotion between your toes.  Trim your toenails straight across. Do not dig under them or around the cuticle. File the edges of your nails with an emery board or nail file.  Do not cut corns or calluses or try to remove them with medicine.  Wear clean socks or stockings every day. Make sure they are not too tight. Do not wear knee-high stockings since they may decrease blood flow to your legs.  Wear shoes that fit properly and have enough cushioning. To break in new shoes, wear them for just a few hours a day. This prevents you from injuring your feet. Always look in your shoes before you put them on to be sure there are no objects inside.  Do not cross your legs. This may decrease the blood flow to your feet.  If you find a minor scrape,  cut, or break in the skin on your feet, keep it and the skin around it clean and dry. These areas may be cleansed with mild soap and water. Do not cleanse the area with peroxide, alcohol, or iodine.  When you remove an adhesive bandage, be sure not to damage the skin around it.  If you have a wound, look at it several times a day to make sure it is healing.  Do not use heating pads or hot water bottles. They may burn your skin. If you have lost feeling in your feet or legs, you may not know it is happening until it is too late.  Make sure your health care provider performs a complete foot exam at least annually or more often if you have foot problems. Report any cuts, sores, or bruises to your health care provider immediately. SEEK MEDICAL CARE IF:   You have an injury that is not healing.  You have cuts or breaks in the skin.  You have an ingrown nail.  You notice redness on your legs or feet.  You feel burning or tingling in your legs or feet.  You have pain or cramps in your legs and feet.  Your legs or feet are numb.  Your feet always feel cold. SEEK IMMEDIATE MEDICAL CARE IF:   There is increasing redness,   swelling, or pain in or around a wound.  There is a red line that goes up your leg.  Pus is coming from a wound.  You develop a fever or as directed by your health care provider.  You notice a bad smell coming from an ulcer or wound.   This information is not intended to replace advice given to you by your health care provider. Make sure you discuss any questions you have with your health care provider.   Document Released: 06/04/2000 Document Revised: 02/07/2013 Document Reviewed: 11/14/2012 Elsevier Interactive Patient Education 2016 [employer]  

## 2015-05-31 ENCOUNTER — Other Ambulatory Visit: Payer: Self-pay | Admitting: Family Medicine

## 2015-06-26 ENCOUNTER — Encounter: Payer: Self-pay | Admitting: Hematology and Oncology

## 2015-06-26 NOTE — Progress Notes (Signed)
Per [employer] promacta shipped via [employer]ex 06/25/15

## 2015-06-27 ENCOUNTER — Encounter: Payer: Self-pay | Admitting: Family Medicine

## 2015-06-27 ENCOUNTER — Ambulatory Visit (INDEPENDENT_AMBULATORY_CARE_PROVIDER_SITE_OTHER): Payer: Medicare Other | Admitting: Family Medicine

## 2015-06-27 VITALS — BP 138/74 | HR 88 | Temp 97.4°F | Resp 18 | Wt 202.0 lb

## 2015-06-27 DIAGNOSIS — Z794 Long term (current) use of insulin: Secondary | ICD-10-CM

## 2015-06-27 DIAGNOSIS — E785 Hyperlipidemia, unspecified: Secondary | ICD-10-CM | POA: Diagnosis not present

## 2015-06-27 DIAGNOSIS — M2550 Pain in unspecified joint: Secondary | ICD-10-CM | POA: Diagnosis not present

## 2015-06-27 DIAGNOSIS — M545 Low back pain, unspecified: Secondary | ICD-10-CM

## 2015-06-27 DIAGNOSIS — D519 Vitamin B12 deficiency anemia, unspecified: Secondary | ICD-10-CM

## 2015-06-27 DIAGNOSIS — IMO0001 Reserved for inherently not codable concepts without codable children: Secondary | ICD-10-CM

## 2015-06-27 DIAGNOSIS — I2510 Atherosclerotic heart disease of native coronary artery without angina pectoris: Secondary | ICD-10-CM

## 2015-06-27 DIAGNOSIS — H8110 Benign paroxysmal vertigo, unspecified ear: Secondary | ICD-10-CM

## 2015-06-27 DIAGNOSIS — E119 Type 2 diabetes mellitus without complications: Secondary | ICD-10-CM

## 2015-06-27 MED ORDER — MECLIZINE HCL 12.5 MG PO TABS: 12.5000 mg | ORAL_TABLET | Freq: Three times a day (TID) | ORAL | Status: DC | PRN

## 2015-06-27 MED ORDER — HYDROCODONE-ACETAMINOPHEN 5-325 MG PO TABS: 1.0000 | ORAL_TABLET | Freq: Four times a day (QID) | ORAL | Status: DC | PRN

## 2015-06-27 MED ORDER — CYANOCOBALAMIN 1000 MCG/ML IJ SOLN
1000.0000 ug | Freq: Once | INTRAMUSCULAR | Status: AC
Start: 2015-06-27 — End: 2015-06-27
  Administered 2015-06-27: 1000 ug via INTRAMUSCULAR

## 2015-06-27 NOTE — Progress Notes (Signed)
Subjective:    Patient ID: Ricardo Watkins, male    DOB: [birth year], 82 y.o.   MRN: [id number]  HPI  10/17/14 Over the last 3 weeks, the patient has been developing severe headaches on the right side of his head. He is also having dizziness with standing. He denies any syncope or presyncope however he feels extremely lightheaded whenever he stands up. He has fallen on 2 separate occasions. The second time he struck his occiput. He denies any chest pain, palpitations. He denies any vertigo. Today on examination Dix-Hallpike maneuver is negative. However the headache is starting to worsen. It is constant. He denies any blurry vision. He denies any neurologic deficits. He does have a history of a stroke. He denies any strokelike symptoms. He denies any diplopia. He denies any memory loss or personality changes. However his dizziness with standing is worsening. His blood pressure has been running low recently at home between 101 115/40-50.  At that time, my plan was: I'm concerned that the patient may be getting dizzy and falling due to low blood pressure. Therefore I'm going to have the patient temporarily discontinue amlodipine as well as Flomax. I will recheck him here next week to see if his symptoms are improving. However given the fact that he has now developed a daily headache along with dizziness and he is recently had his head several times after falls, I will obtain a head CT without contrast to evaluate for subdural hematoma versus other intracranial pathology. Given his chronic kidney disease, I will not be able to use contrast. However we should be able to see any sign of bleeding without contrast. Patient's blood pressure is low. I will discontinue amlodipine. While he is here with a check a hemoglobin A1c along with a urine microalbumin to monitor his insulin-dependent diabetes mellitus. We may need to decrease his medication if his blood sugar is too tightly controlled. Given his age, and ideal  hemoglobin A1c will be between 7 and 8. I will also check a fasting lipid panel. Goal LDL cholesterol is less than 70 given his history of coronary artery disease as well as stroke. Patient is also having some chronic low back pain. This back pain has worsened after his fall yesterday. There is no tenderness to palpation over the spinous processes of the lumbar spine. The pain is more muscular in nature and Tylenol is not relieving his pain. Because of his chronic kidney disease he cannot take NSAIDs. Therefore I did give the patient hydrocodone/acetaminophen 5/325 one by mouth every 6 hours when necessary pain  10/24/14 Patient states he is doing much better. He has not fallen anymore. He is no longer having dizzy spells. Patient did have to resume the Flomax as he was having urinary retention without it. However he continues to refrain from using amlodipine. Unfortunately he saw his hematologist recently. His myelodysplasia is worsening. They're concerned about a reactivation of his non-Hodgkin's lymphoma. This scheduled the patient for a PET scan.  At that time, my plan was: Patient's dizziness seems to be related to his blood pressure. He feels much better since discontinuing amlodipine. His head CT showed no stroke, intracranial hemorrhage, or brain tumor. Therefore no further follow-up is necessary for this condition. I will eagerly await the results of his PET scan  01/24/15 Patient continues to complain of dizziness. He complains of poor balance with standing. His blood pressure at home has been low at times between 110 and 120/60-70. He is also on numerous  medications that can also affect his balance including oxybutynin, Flomax, and occasional pain medication. On examination today he has +1 pitting edema in both legs and faint bibasilar crackles. The patient is on Actos for diabetes.  At that time, my plan was: I have asked the patient to discontinue oxybutynin. I would like him to decrease Toprol-XL  to 50 mg by mouth daily. I would like to recheck in 2 weeks to see if his dizziness has improved. I will also schedule the patient for physical therapy to try to improve his strength, his coordination, and his balance. I will check a hemoglobin A1c and as long as his hemoglobin A1c is less than 8, I will discontinue Actos due to the swelling.  03/14/15 Here for follow up.  Patient's blood pressure is typically 160-170 over 90s in the night and early morning.  Throughout the day his blood pressure will drop after he takes his medication. He does feel better and less dizzy with the higher blood pressures. Unfortunately his platelet count has fallen to 40. He is working with the hematologist to treat this. He is on Plavix for coronary artery disease. However given his history of renal insufficiency, thrombocytopenia, I am very concerned about him having uncontrolled bleeding requiring hospitalization.  At that time, my plan was: Add clonidine 0.1 mg by mouth daily at bedtime. Recheck blood pressures in 2-3 weeks. Temporarily  Discontinue Plavix due to thrombocytopenia..  03/25/15 Patient feels poorly since I last saw him. He reports a dull pounding headache in the front of his head on a daily basis. He checks his blood pressure when he has a headache and it is elevated in the 99991111 range systolic. He also reports nausea. He reports early satiety. He reports vomiting. Patient has a long-standing history of insulin-dependent diabetes mellitus. He reports weakness. He blames this on the flu shot although last time I did start the patient on clonidine. He is still having bowel movements. He is still passing flatus. Any fevers or chills. He denies any symptoms of a sinus infection. His blood pressure is fluctuating but typically ranges 140-170/90-100.  At that time, my plan was: I am concerned that the clonidine may be causing the patient to feel poorly. He can certainly cause fatigue, sleepiness, and a headache. I  am also concerned that his headache may be due to his uncontrolled blood pressure. Therefore I will discontinue clonidine. I will start the patient on boxes 074 mg by mouth daily to help treat his blood pressure. Patient cannot stop Flomax. However I will discontinue Flomax and hopefully doxazosin can help with his BPH symptoms. We will need to monitor closely for dizziness on this medication as he has recently been complaining of dizziness. I'm concerned that the nausea and vomiting may be due to some type of diabetic gastroparesis. I'll start the patient on Reglan 5 mg by mouth every before meals. Recheck the patient in one week or sooner if worse.  03/31/15 Patient's blood pressure has improved. His blood pressure is ranging 140-150/90. His nausea has improved although now he is developing diarrhea. He is still taking Reglan 3 times a day. Unfortunately he reports fevers and night sweats and chills. This is been going on now for the last few weeks. He also reports diffuse bone pain. He states he feels like the way he felt when he was diagnosed with non-Hodgkin's lymphoma. He does have a history of myelodysplastic syndrome. He also has thrombocytopenia.  At that time, my plan was:  While the patient's symptoms have improved which we saw him for the last week, I am concerned for diffuse bone pain and muscle pains and subjective fevers and chills and night sweats could be a sign that his myelodysplastic syndrome is activating into some type of bone marrow neoplasm. Therefore I will check a CBC today to monitor his white blood cell count.  Management decisions will be based upon the results of the patient's lab work.  I would also consider an SPEP/UPEP, and/or a skeletal survey of the areas of maximum tenderness.  04/17/15 SPEP and UPEP were within normal limits. There is no significant change in his CBC.  X-rays were obtained of his shoulders as well as his C-spine, T-spine, and L-spine. All showed arthritic  changes. However there are no significant lytic lesions or bony metastasis. Therefore I had the patient temporarily discontinue his statin medication and recheck today to see if his pain had improved. I was also concerned about possible polymyalgia rheumatica. Tentative plan was to try the patient on prednisone if the pain had not improved after discontinuing his statin. He is here today for recheck. Thankfully, since stopping his statin, he is 50-60% better and his diffuse muscle pains have improved dramatically. His lab work was significant for low potassium at 3.1. His platelet count remained low in the 40s. We are currently holding his antiplatelet medication because of his thrombocytopenia.  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 Continue to hold the statin. Recheck a fasting lipid panel in January. His LDL cholesterol rises substantially above 100, I will try the patient on low-dose pravastatin. I will treat his potassium with K-Dur 20 milliequivalents by mouth daily for 1 week and then recheck a potassium level next week. Patient is currently having his platelets monitored at the cancer center. Once his platelet counts are above 50, I will have the patient resume his antiplatelet medication for prevention of cardiovascular disease.  06/27/15  the patient is here today for recheck. He reports pain in his shoulders and in his neck and in his back area and the pain improved. The statins. However he also has had a recent worsening of the pain due to the weather change. He also reports vertigo over the last week. He states that the room spins whenever he stands up or changes positions. His blood pressure today is outstanding at 138/74. He denies any chest pain shortness of breath or dyspnea on exertion. He is overdue check his potassium along with his cholesterol Past Medical History  Diagnosis Date  . PVD (peripheral  vascular disease) ([facility])     rt renal artery stent  . CAD (coronary artery disease)     30% LAD Stenosis, 70% ramus intermedius stenosis, treated with PTCA and angioplasty by Dr Albertine Patricia 2004  . DM (diabetes mellitus) ([place])   . Dyslipidemia   . Renal insufficiency   . Thrombocytopenia ([place])   . Hypertension   . Vitamin D deficiency   . Non Hodgkin's lymphoma ([place])   . Inguinal hernia     right  . Adenomatous colon polyp   . Cataract     right eye  . Hyperlipidemia   . Prostate cancer ([place])     seed implants no recurrence  . 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  Prostate CA ([place]) 09/10/2011    Gleason 3+3 R, 3+4 L lobe May 2007 Rx Radioactive seed implants Dr [name]  . DVT (deep venous thrombosis) ([place])     secondary to surgery  . UTI (urinary tract infection)   . 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 Deficiency anemia 04/19/2014  . Diverticulosis    Past Surgical History  Procedure Laterality Date  . Cystourethroscopy      ROBOTIC ARM NUCLETRON SEED IMPLANTATION OF PROSTATE  . Exploratory laparotomy      For evaluation of lymphoma  . Colonoscopy    . Appendectomy      patient ?   Current Outpatient Prescriptions on File Prior to Visit  Medication Sig Dispense Refill  . acetaminophen (TYLENOL) 500 MG tablet Take 500 mg by mouth every 6 (six) hours as needed for pain.    Marland Kitchen amoxicillin-clavulanate (AUGMENTIN) 875-125 MG tablet Take 1 tablet by mouth 2 (two) times daily. 20 tablet 0  . cyanocobalamin (,VITAMIN B-12,) 1000 MCG/ML injection Inject 1,000 mcg into the muscle every 30 (thirty) days.     Marland Kitchen doxazosin (CARDURA) 4 MG tablet Take 1 tablet (4 mg total) by mouth daily. 30 tablet 5  . eltrombopag (PROMACTA) 50 MG tablet Take 1 tablet (50 mg total) by mouth daily. Take on an  empty stomach 1 hour before a meal or 2 hours after 30 tablet 6  . HYDROcodone-acetaminophen (NORCO) 5-325 MG tablet Take 1 tablet by mouth every 6 (six) hours as needed for moderate pain. 30 tablet 0  . KLOR-CON M20 20 MEQ tablet TAKE 1 TABLET (20 MEQ TOTAL) BY MOUTH DAILY. 30 tablet 11  . LANTUS SOLOSTAR 100 UNIT/ML Solostar Pen INJECT 25 UNITS INTO THE SKIN AT BEDTIME. 15 pen 3  . metoCLOPramide (REGLAN) 5 MG tablet Take 1 tablet (5 mg total) by mouth 3 (three) times daily before meals. 90 tablet 0  . metoprolol succinate (TOPROL-XL) 100 MG 24 hr tablet TAKE 1 TABLET DAILY WITH A MEAL OR IMMEDIATELY FOLLOWING A MEAL 90 tablet 3  . quinapril (ACCUPRIL) 40 MG tablet Take 40 mg by mouth daily.  0   No current facility-administered medications on file prior to visit.   Allergies  Allergen Reactions  . Glucophage [Metformin Hydrochloride]     Chest pain  . Zetia [Ezetimibe]     weakness  . Fenofibrate Rash  . Niacin-Lovastatin Er Rash   Social History   Social History  . Marital Status: Married    Spouse Name: N/A  . Number of Children: 4  . Years of Education: N/A   Occupational History  . retired    Social History Main Topics  . Smoking status: Former Smoker    Types: Cigarettes    Quit date: 06/21/1958  . Smokeless tobacco: Never Used     Comment: Quit 30 years ago. and use to smoke pipe but quit  . Alcohol Use: No  . Drug Use: No  . Sexual Activity:    Partners: Female   Other Topics Concern  . Not on file   Social History Narrative     Review of Systems  All other systems reviewed and are negative.      Objective:   Physical Exam  Constitutional: He is oriented to person, place, and time. He appears well-developed and well-nourished.  HENT:  Nose: Nose normal.  Mouth/Throat: Oropharynx is clear and moist.  Neck: Neck supple.  Cardiovascular: Normal rate, regular rhythm and normal heart sounds.   No murmur heard. Pulmonary/Chest: Effort normal and  breath sounds normal. No respiratory distress. He has no wheezes. He has no rales.  Abdominal: Soft. Bowel sounds are normal. He exhibits no distension. There is no tenderness. There is no rebound and no guarding.  Musculoskeletal: He exhibits no edema.       Cervical back: He exhibits decreased range of motion and tenderness.       Lumbar back: He exhibits decreased range of motion and tenderness.  Lymphadenopathy:    He has no cervical adenopathy.  Neurological: He is alert and oriented to person, place, and time. He has normal reflexes. No cranial nerve deficit. He exhibits normal muscle tone. Coordination normal.  Vitals reviewed.         Assessment & Plan:  B12 deficiency anemia - Plan: cyanocobalamin ((VITAMIN B-12)) injection 1,000 mcg  Midline low back pain without sciatica  Arthralgia - Plan: HYDROcodone-acetaminophen (NORCO) 5-325 MG tablet  Benign paroxysmal positional vertigo, unspecified laterality - Plan: meclizine (ANTIVERT) 12.5 MG tablet   patient will receive his B12 shot today. He has diffuse arthralgias. I believe he was having statin induced myopathy but he also has underlying degenerative disc disease and arthritis all throughout his spine. Given his complicated medical problems, I will give the patient hydrocodone 5/325 one by mouth every 6 hours when necessary pain. I instructed him to use this sparingly as it can contribute to falls and dizziness and constipation. I recommended that he take a stool softener if he takes the pain medication. Also gave the patient meclizine 12.5 mg every 8 hours when necessary vertigo. I warned him about dry mouth,  Somnolence and constipation on this. I suggested that he not take the 2 medications together. He can use this if the vertigo gets worse. I would like him to return next week fasting so that we can check his potassium, his hemoglobin A1c, and his fasting lipid panel. If his LDL is much greater than 100, I will likely try the  patient on pravastatin

## 2015-07-01 ENCOUNTER — Encounter: Payer: Self-pay | Admitting: Pharmacist

## 2015-07-01 NOTE — Progress Notes (Signed)
Oral Chemotherapy Follow-Up Form  Original Start date of oral chemotherapy: __10/12/16_____   Called patient today to follow up regarding patient's oral chemotherapy medication: Promacta  Pt is doing well today with no complaints. He was having some diarrhea but this has improved. No other issues with Promacta. No missed doses to report. Ricardo Watkins states his wife helps remind him to take his medication every day.   Pt reports __0__ tablets/doses missed in the last month.    Other Issues: __N/A_______   Will follow up and call patient again in _1 month__   Thank you,  Montel Clock, PharmD, [facility]

## 2015-07-02 ENCOUNTER — Other Ambulatory Visit: Payer: Medicare Other

## 2015-07-02 DIAGNOSIS — E785 Hyperlipidemia, unspecified: Secondary | ICD-10-CM | POA: Diagnosis not present

## 2015-07-02 DIAGNOSIS — I2510 Atherosclerotic heart disease of native coronary artery without angina pectoris: Secondary | ICD-10-CM | POA: Diagnosis not present

## 2015-07-02 DIAGNOSIS — Z794 Long term (current) use of insulin: Secondary | ICD-10-CM | POA: Diagnosis not present

## 2015-07-02 DIAGNOSIS — E119 Type 2 diabetes mellitus without complications: Secondary | ICD-10-CM | POA: Diagnosis not present

## 2015-07-02 LAB — COMPLETE METABOLIC PANEL WITH GFR
ALBUMIN: 3.5 g/dL — AB (ref 3.6–5.1)
ALK PHOS: 77 U/L (ref 40–115)
ALT: 10 U/L (ref 9–46)
AST: 12 U/L (ref 10–35)
BILIRUBIN TOTAL: 0.7 mg/dL (ref 0.2–1.2)
BUN: 15 mg/dL (ref 7–25)
CALCIUM: 8.5 mg/dL — AB (ref 8.6–10.3)
CO2: 24 mmol/L (ref 20–31)
Chloride: 106 mmol/L (ref 98–110)
Creat: 1.34 mg/dL — ABNORMAL HIGH (ref 0.70–1.11)
GFR, Est African American: 57 mL/min — ABNORMAL LOW (ref >=60)
GFR, Est Non African American: 49 mL/min — ABNORMAL LOW (ref >=60)
GLUCOSE: 150 mg/dL — AB (ref 70–99)
Potassium: 4.3 mmol/L (ref 3.5–5.3)
Sodium: 138 mmol/L (ref 135–146)
Total Protein: 7.1 g/dL (ref 6.1–8.1)

## 2015-07-02 LAB — LIPID PANEL
CHOL/HDL RATIO: 6.3 ratio — AB (ref <=5.0)
Cholesterol: 119 mg/dL — ABNORMAL LOW (ref 125–200)
HDL: 19 mg/dL — ABNORMAL LOW (ref >=40)
LDL CALC: 50 mg/dL (ref <130)
Triglycerides: 248 mg/dL — ABNORMAL HIGH (ref <150)
VLDL: 50 mg/dL — AB (ref <30)

## 2015-07-02 LAB — HEMOGLOBIN A1C
Hgb A1c MFr Bld: 9.4 % — ABNORMAL HIGH (ref <5.7)
Mean Plasma Glucose: 223 mg/dL — ABNORMAL HIGH (ref <117)

## 2015-07-07 ENCOUNTER — Other Ambulatory Visit (HOSPITAL_BASED_OUTPATIENT_CLINIC_OR_DEPARTMENT_OTHER): Payer: Medicare Other

## 2015-07-07 ENCOUNTER — Encounter: Payer: Self-pay | Admitting: Hematology and Oncology

## 2015-07-07 ENCOUNTER — Telehealth: Payer: Self-pay | Admitting: *Deleted

## 2015-07-07 ENCOUNTER — Telehealth: Payer: Self-pay | Admitting: Hematology and Oncology

## 2015-07-07 ENCOUNTER — Ambulatory Visit (HOSPITAL_BASED_OUTPATIENT_CLINIC_OR_DEPARTMENT_OTHER): Payer: Medicare Other | Admitting: Hematology and Oncology

## 2015-07-07 ENCOUNTER — Other Ambulatory Visit: Payer: Self-pay | Admitting: Hematology and Oncology

## 2015-07-07 VITALS — BP 143/52 | HR 87 | Temp 97.7°F | Resp 18 | Ht 69.0 in | Wt 201.6 lb

## 2015-07-07 DIAGNOSIS — Z8572 Personal history of non-Hodgkin lymphomas: Secondary | ICD-10-CM | POA: Diagnosis not present

## 2015-07-07 DIAGNOSIS — D696 Thrombocytopenia, unspecified: Secondary | ICD-10-CM

## 2015-07-07 DIAGNOSIS — D649 Anemia, unspecified: Secondary | ICD-10-CM | POA: Insufficient documentation

## 2015-07-07 DIAGNOSIS — D46C Myelodysplastic syndrome with isolated del(5q) chromosomal abnormality: Secondary | ICD-10-CM

## 2015-07-07 DIAGNOSIS — D631 Anemia in chronic kidney disease: Secondary | ICD-10-CM

## 2015-07-07 DIAGNOSIS — N189 Chronic kidney disease, unspecified: Secondary | ICD-10-CM | POA: Diagnosis not present

## 2015-07-07 DIAGNOSIS — D509 Iron deficiency anemia, unspecified: Secondary | ICD-10-CM

## 2015-07-07 DIAGNOSIS — Z8579 Personal history of other malignant neoplasms of lymphoid, hematopoietic and related tissues: Secondary | ICD-10-CM

## 2015-07-07 HISTORY — DX: Iron deficiency anemia, unspecified: D50.9

## 2015-07-07 LAB — CBC WITH DIFFERENTIAL/PLATELET
BASO%: 0.3 % (ref 0.0–2.0)
BASOS ABS: 0 10*3/uL (ref 0.0–0.1)
EOS%: 0.9 % (ref 0.0–7.0)
Eosinophils Absolute: 0.1 10*3/uL (ref 0.0–0.5)
HCT: 30.6 % — ABNORMAL LOW (ref 38.4–49.9)
HGB: 9.1 g/dL — ABNORMAL LOW (ref 13.0–17.1)
LYMPH#: 1.8 10*3/uL (ref 0.9–3.3)
LYMPH%: 32.2 % (ref 14.0–49.0)
MCH: 23.1 pg — AB (ref 27.2–33.4)
MCHC: 29.7 g/dL — AB (ref 32.0–36.0)
MCV: 77.7 fL — AB (ref 79.3–98.0)
MONO#: 0.6 10*3/uL (ref 0.1–0.9)
MONO%: 11 % (ref 0.0–14.0)
NEUT#: 3.2 10*3/uL (ref 1.5–6.5)
NEUT%: 55.6 % (ref 39.0–75.0)
Platelets: 55 10*3/uL — ABNORMAL LOW (ref 140–400)
RBC: 3.94 10*6/uL — AB (ref 4.20–5.82)
RDW: 27.4 % — ABNORMAL HIGH (ref 11.0–14.6)
WBC: 5.7 10*3/uL (ref 4.0–10.3)
nRBC: 1 % — ABNORMAL HIGH (ref 0–0)

## 2015-07-07 LAB — TECHNOLOGIST REVIEW

## 2015-07-07 NOTE — Assessment & Plan Note (Signed)
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 He has no signs of disease. Continue close monitoring

## 2015-07-07 NOTE — Telephone Encounter (Signed)
Those look great.  Don't change anything.

## 2015-07-07 NOTE — Assessment & Plan Note (Signed)
He tolerated treatment with Promacta well. His insurance refused to pay for Revlimid. He had resolution of leukopenia and the platelet count has improved well over 50,000. Anemia has remained the same as it is multifactorial. The patient also has anemia of chronic kidney disease. He was noted to have microcytosis and I recommend multivitamin supplement. His primary care doctor is prescribing vitamin B-12 injections. I will order iron studies next visit His recent mild worsening pancytopenia is likely related to recent viral cold-like illness. I will see him in a month for further review and consider adjusting the dose of his treatment if he gets worsening pancytopenia

## 2015-07-07 NOTE — Telephone Encounter (Signed)
Call placed to patient and patient wife Ricardo Watkins made aware.

## 2015-07-07 NOTE — Telephone Encounter (Signed)
Received call from patient.  Reports that FSBS are as follows:            AM     2hr     12pm     2hr 1/13:  140     116    144 1/14:  130               161        160 1/15:  143               147 1/16:  127  MD to be made aware.

## 2015-07-07 NOTE — Assessment & Plan Note (Signed)
This is chronic related to myelodysplastic syndrome.  PET scan did not show evidence of splenomegaly He is currently getting vitamin B-12 replacement therapy.  He is not symptomatic apart from easy bruising. Continue close monitoring

## 2015-07-07 NOTE — Telephone Encounter (Signed)
Gave relative avs report and appointments for February.  °

## 2015-07-07 NOTE — Progress Notes (Signed)
[facility] OFFICE PROGRESS NOTE  Patient Care Team: [name], MD as PCP - General (Family Medicine) Heath Lark, MD as Consulting Physician (Hematology and Oncology)  SUMMARY OF ONCOLOGIC HISTORY: Oncology History   Calculated R-IPSS score at low risk     MDS (myelodysplastic syndrome) with 5q deletion ([place])   11/07/2014 Bone Marrow Biopsy Accession: [id number] marrow biopsy showed myelodysplastic syndrome with 5q deletion.   04/02/2015 -  Chemotherapy He started taking Promacta daily for thrombocytopenia    INTERVAL HISTORY: Please see below for problem oriented charting. The patient had recent cold-like illness. his symptoms of flulike illness has improved  The patient denies any recent signs or symptoms of bleeding such as spontaneous epistaxis, hematuria or hematochezia.   REVIEW OF SYSTEMS:   Constitutional: Denies fevers, chills or abnormal weight loss Eyes: Denies blurriness of vision Ears, nose, mouth, throat, and face: Denies mucositis or sore throat Respiratory: Denies cough, dyspnea or wheezes Cardiovascular: Denies palpitation, chest discomfort or lower extremity swelling Gastrointestinal:  Denies nausea, heartburn or change in bowel habits Skin: Denies abnormal skin rashes Lymphatics: Denies new lymphadenopathy or easy bruising Neurological:Denies numbness, tingling or new weaknesses Behavioral/Psych: Mood is stable, no new changes  All other systems were reviewed with the patient and are negative.  I have reviewed the past medical history, past surgical history, social history and family history with the patient and they are unchanged from previous note.  ALLERGIES:  is allergic to glucophage; zetia; fenofibrate; and niacin-lovastatin er.  MEDICATIONS:  Current Outpatient Prescriptions  Medication Sig Dispense Refill  . acetaminophen (TYLENOL) 500 MG tablet Take 500 mg by mouth every 6 (six) hours as needed for pain.    Marland Kitchen clopidogrel  (PLAVIX) 75 MG tablet TAKE 1 TABLET BY MOUTH DAILY WITH BREAKFAST.  1  . cyanocobalamin (,VITAMIN B-12,) 1000 MCG/ML injection Inject 1,000 mcg into the muscle every 30 (thirty) days.     Marland Kitchen doxazosin (CARDURA) 4 MG tablet Take 1 tablet (4 mg total) by mouth daily. 30 tablet 5  . eltrombopag (PROMACTA) 50 MG tablet Take 1 tablet (50 mg total) by mouth daily. Take on an empty stomach 1 hour before a meal or 2 hours after 30 tablet 6  . HYDROcodone-acetaminophen (NORCO) 5-325 MG tablet Take 1 tablet by mouth every 6 (six) hours as needed for moderate pain. 30 tablet 0  . KLOR-CON M20 20 MEQ tablet TAKE 1 TABLET (20 MEQ TOTAL) BY MOUTH DAILY. 30 tablet 11  . LANTUS SOLOSTAR 100 UNIT/ML Solostar Pen INJECT 25 UNITS INTO THE SKIN AT BEDTIME. 15 pen 3  . meclizine (ANTIVERT) 12.5 MG tablet Take 1 tablet (12.5 mg total) by mouth 3 (three) times daily as needed for dizziness. 30 tablet 0  . metoCLOPramide (REGLAN) 5 MG tablet Take 1 tablet (5 mg total) by mouth 3 (three) times daily before meals. 90 tablet 0  . metoprolol succinate (TOPROL-XL) 100 MG 24 hr tablet TAKE 1 TABLET DAILY WITH A MEAL OR IMMEDIATELY FOLLOWING A MEAL 90 tablet 3  . quinapril (ACCUPRIL) 40 MG tablet Take 40 mg by mouth daily.  0   No current facility-administered medications for this visit.    PHYSICAL EXAMINATION: ECOG PERFORMANCE STATUS: 1 - Symptomatic but completely ambulatory  Filed Vitals:   07/07/15 1504  BP: 143/52  Pulse: 87  Temp: 97.7 F (36.5 C)  Resp: 18   Filed Weights   07/07/15 1504  Weight: 201 lb 9.6 oz (91.445 kg)    GENERAL:alert,  no distress and comfortable SKIN: skin color, texture, turgor are normal, no rashes or significant lesions. Noted skin bruising EYES: normal, Conjunctiva are pink and non-injected, sclera clear Musculoskeletal:no cyanosis of digits and no clubbing  NEURO: alert & oriented x 3 with fluent speech, no focal motor/sensory deficits  LABORATORY DATA:  I have reviewed the  data as listed    Component Value Date/Time   NA 138 07/02/2015 0841   NA 139 04/07/2015 1328   K 4.3 07/02/2015 0841   K 3.1* 04/07/2015 1328   CL 106 07/02/2015 0841   CL 107 09/27/2012 0940   CO2 24 07/02/2015 0841   CO2 28 04/07/2015 1328   GLUCOSE 150* 07/02/2015 0841   GLUCOSE 210* 04/07/2015 1328   GLUCOSE 89 09/27/2012 0940   BUN 15 07/02/2015 0841   BUN 11.9 04/07/2015 1328   CREATININE 1.34* 07/02/2015 0841   CREATININE 1.4* 04/07/2015 1328   CREATININE 1.64* 09/10/2011 1204   CALCIUM 8.5* 07/02/2015 0841   CALCIUM 8.1* 04/07/2015 1328   PROT 7.1 07/02/2015 0841   PROT 7.2 04/07/2015 1328   ALBUMIN 3.5* 07/02/2015 0841   ALBUMIN 3.1* 04/07/2015 1328   AST 12 07/02/2015 0841   AST 11 04/07/2015 1328   ALT 10 07/02/2015 0841   ALT 11 04/07/2015 1328   ALKPHOS 77 07/02/2015 0841   ALKPHOS 72 04/07/2015 1328   BILITOT 0.7 07/02/2015 0841   BILITOT 0.36 04/07/2015 1328   GFRNONAA 49* 07/02/2015 0841   GFRNONAA 51* 01/19/2011 0605   GFRAA 57* 07/02/2015 0841   GFRAA >60 01/19/2011 0605    No results found for: SPEP, UPEP  Lab Results  Component Value Date   WBC 5.7 07/07/2015   NEUTROABS 3.2 07/07/2015   HGB 9.1* 07/07/2015   HCT 30.6* 07/07/2015   MCV 77.7* 07/07/2015   PLT 55* 07/07/2015      Chemistry      Component Value Date/Time   NA 138 07/02/2015 0841   NA 139 04/07/2015 1328   K 4.3 07/02/2015 0841   K 3.1* 04/07/2015 1328   CL 106 07/02/2015 0841   CL 107 09/27/2012 0940   CO2 24 07/02/2015 0841   CO2 28 04/07/2015 1328   BUN 15 07/02/2015 0841   BUN 11.9 04/07/2015 1328   CREATININE 1.34* 07/02/2015 0841   CREATININE 1.4* 04/07/2015 1328   CREATININE 1.64* 09/10/2011 1204      Component Value Date/Time   CALCIUM 8.5* 07/02/2015 0841   CALCIUM 8.1* 04/07/2015 1328   ALKPHOS 77 07/02/2015 0841   ALKPHOS 72 04/07/2015 1328   AST 12 07/02/2015 0841   AST 11 04/07/2015 1328   ALT 10 07/02/2015 0841   ALT 11 04/07/2015 1328    BILITOT 0.7 07/02/2015 0841   BILITOT 0.36 04/07/2015 1328      ASSESSMENT & PLAN:  History of lymphoma The patient have follicular lymphoma treated in the past. He has no signs of disease. Continue close monitoring  MDS (myelodysplastic syndrome) with 5q deletion He tolerated treatment with Promacta well. His insurance refused to pay for Revlimid. He had resolution of leukopenia and the platelet count has improved well over 50,000. Anemia has remained the same as it is multifactorial. The patient also has anemia of chronic kidney disease. He was noted to have microcytosis and I recommend multivitamin supplement. His primary care doctor is prescribing vitamin B-12 injections. I will order iron studies next visit His recent mild worsening pancytopenia is likely related to recent viral cold-like illness.  I will see him in a month for further review and consider adjusting the dose of his treatment if he gets worsening pancytopenia  Thrombocytopenia This is chronic related to myelodysplastic syndrome.  PET scan did not show evidence of splenomegaly He is currently getting vitamin B-12 replacement therapy.  He is not symptomatic apart from easy bruising. Continue close monitoring   Orders Placed This Encounter  Procedures  . Ferritin    Standing Status: Future     Number of Occurrences:      Standing Expiration Date: 07/06/2016   All questions were answered. The patient knows to call the clinic with any problems, questions or concerns. No barriers to learning was detected. I spent 15 minutes counseling the patient face to face. The total time spent in the appointment was 20 minutes and more than 50% was on counseling and review of test results     [facility], [place], MD 07/07/2015 3:46 PM

## 2015-07-09 ENCOUNTER — Telehealth: Payer: Self-pay | Admitting: *Deleted

## 2015-07-09 ENCOUNTER — Encounter: Payer: Self-pay | Admitting: Hematology and Oncology

## 2015-07-09 MED ORDER — ELTROMBOPAG OLAMINE 50 MG PO TABS: 50.0000 mg | ORAL_TABLET | Freq: Every day | ORAL | Status: DC

## 2015-07-09 NOTE — Telephone Encounter (Signed)
Rx for Promacta given to Raquel in managed care to send to [facility] to apply for Co-pay assistance again.  Pt's wife told Raquel they are being charged for medication now.

## 2015-07-09 NOTE — Progress Notes (Signed)
I spoke with wife and she said they are being charged for promacta and have them in donut hole with medicare. I advised will send to them to see if asst. Requested script from [facility]

## 2015-07-21 ENCOUNTER — Encounter: Payer: Self-pay | Admitting: Hematology and Oncology

## 2015-07-21 NOTE — Progress Notes (Signed)
Per sloan, [facility] was delivered and no copy. They still have funds with paf. See prev notes, wife thinks they were billed for promacta. Next shipment in feb and close to that time,they will know if grant has expired with paf.

## 2015-07-28 ENCOUNTER — Encounter: Payer: Self-pay | Admitting: Hematology and Oncology

## 2015-07-28 ENCOUNTER — Ambulatory Visit (INDEPENDENT_AMBULATORY_CARE_PROVIDER_SITE_OTHER): Payer: Medicare Other | Admitting: Family Medicine

## 2015-07-28 DIAGNOSIS — D519 Vitamin B12 deficiency anemia, unspecified: Secondary | ICD-10-CM | POA: Diagnosis not present

## 2015-07-28 MED ORDER — CYANOCOBALAMIN 1000 MCG/ML IJ SOLN
1000.0000 ug | INTRAMUSCULAR | Status: DC
  Administered 2015-07-28: 1000 ug via INTRAMUSCULAR

## 2015-07-28 NOTE — Progress Notes (Signed)
Per [facility] promacta was shipped via fedex 07/25/15

## 2015-07-29 ENCOUNTER — Encounter: Payer: Self-pay | Admitting: Pharmacist

## 2015-07-29 NOTE — Progress Notes (Signed)
Oral Chemotherapy Follow-Up Form  Original Start date of oral chemotherapy: _10/12/16__   Called patient today to follow up regarding patient's oral chemotherapy medication: _Promacta__  Pt is doing well today. No issues with his Promacta. He has diarrhea once a week. He takes an anti-diarrheal for this. No missed doses. No other side effects. He [facility] the [employer] [facility] at [facility] (pt has a Radio producer). He may have had his insurance charged in January which has put him in "the [employer]" early this year and he has some concerns about that. He will let us know if it becomes an issue and may need to talk with the financial team  Pt reports __0__ tablets/doses missed in the last week/month.    Pt reports the following side effects: _mild diarrhea (once a week)___   Will follow up and call patient again in __1 month___   Thank you,  Montel Clock, PharmD, [facility]

## 2015-08-08 ENCOUNTER — Encounter: Payer: Self-pay | Admitting: Hematology and Oncology

## 2015-08-08 ENCOUNTER — Ambulatory Visit (HOSPITAL_BASED_OUTPATIENT_CLINIC_OR_DEPARTMENT_OTHER): Payer: Medicare Other | Admitting: Hematology and Oncology

## 2015-08-08 ENCOUNTER — Other Ambulatory Visit (HOSPITAL_BASED_OUTPATIENT_CLINIC_OR_DEPARTMENT_OTHER): Payer: Medicare Other

## 2015-08-08 ENCOUNTER — Telehealth: Payer: Self-pay | Admitting: Hematology and Oncology

## 2015-08-08 VITALS — BP 135/60 | HR 91 | Temp 97.7°F | Resp 18 | Ht 69.0 in | Wt 202.0 lb

## 2015-08-08 DIAGNOSIS — R197 Diarrhea, unspecified: Secondary | ICD-10-CM | POA: Diagnosis not present

## 2015-08-08 DIAGNOSIS — R630 Anorexia: Secondary | ICD-10-CM

## 2015-08-08 DIAGNOSIS — N189 Chronic kidney disease, unspecified: Secondary | ICD-10-CM

## 2015-08-08 DIAGNOSIS — D46C Myelodysplastic syndrome with isolated del(5q) chromosomal abnormality: Secondary | ICD-10-CM

## 2015-08-08 DIAGNOSIS — D696 Thrombocytopenia, unspecified: Secondary | ICD-10-CM | POA: Diagnosis not present

## 2015-08-08 DIAGNOSIS — D631 Anemia in chronic kidney disease: Secondary | ICD-10-CM | POA: Diagnosis not present

## 2015-08-08 DIAGNOSIS — D509 Iron deficiency anemia, unspecified: Secondary | ICD-10-CM

## 2015-08-08 LAB — CBC WITH DIFFERENTIAL/PLATELET
BASO%: 0.2 % (ref 0.0–2.0)
BASOS ABS: 0 10*3/uL (ref 0.0–0.1)
EOS ABS: 0.1 10*3/uL (ref 0.0–0.5)
EOS%: 1.2 % (ref 0.0–7.0)
HEMATOCRIT: 34.9 % — AB (ref 38.4–49.9)
HEMOGLOBIN: 10.3 g/dL — AB (ref 13.0–17.1)
LYMPH%: 37.2 % (ref 14.0–49.0)
MCH: 23.9 pg — AB (ref 27.2–33.4)
MCHC: 29.5 g/dL — ABNORMAL LOW (ref 32.0–36.0)
MCV: 81 fL (ref 79.3–98.0)
MONO#: 0.6 10*3/uL (ref 0.1–0.9)
MONO%: 14.6 % — AB (ref 0.0–14.0)
NEUT#: 1.9 10*3/uL (ref 1.5–6.5)
NEUT%: 46.8 % (ref 39.0–75.0)
NRBC: 0 % (ref 0–0)
Platelets: 50 10*3/uL — ABNORMAL LOW (ref 140–400)
RBC: 4.31 10*6/uL (ref 4.20–5.82)
RDW: 27.3 % — AB (ref 11.0–14.6)
WBC: 4 10*3/uL (ref 4.0–10.3)
lymph#: 1.5 10*3/uL (ref 0.9–3.3)

## 2015-08-08 LAB — TECHNOLOGIST REVIEW

## 2015-08-08 LAB — FERRITIN: FERRITIN: 255 ng/mL (ref 22–316)

## 2015-08-08 NOTE — Assessment & Plan Note (Signed)
He had complained of intermittent diarrhea. He noted that the patient is taking Reglan. I recommend close monitoring of his diet and consider reducing the Reglan to twice a day if he continues to have severe diarrhea

## 2015-08-08 NOTE — Progress Notes (Signed)
[place] OFFICE PROGRESS NOTE  Patient Care Team: [name], MD as PCP - General (Family Medicine) Heath Lark, MD as Consulting Physician (Hematology and Oncology)  SUMMARY OF ONCOLOGIC HISTORY: Oncology History   Calculated R-IPSS score at low risk     MDS (myelodysplastic syndrome) with 5q deletion ([place])   11/07/2014 Bone Marrow Biopsy Accession: [id number] marrow biopsy showed myelodysplastic syndrome with 5q deletion.   04/02/2015 -  Chemotherapy He started taking Promacta daily for thrombocytopenia    INTERVAL HISTORY: Please see below for problem oriented charting.  he returns for further follow-up. The family members noticed poor appetite and chronic fatigue. Denies recent infection. He has intermittent diarrhea. His blood sugar is under good control The patient denies any recent signs or symptoms of bleeding such as spontaneous epistaxis, hematuria or hematochezia.  He has not lost any weight  REVIEW OF SYSTEMS:   Constitutional: Denies fevers, chills or abnormal weight loss Eyes: Denies blurriness of vision Ears, nose, mouth, throat, and face: Denies mucositis or sore throat Respiratory: Denies cough, dyspnea or wheezes Cardiovascular: Denies palpitation, chest discomfort or lower extremity swelling Skin: Denies abnormal skin rashes Lymphatics: Denies new lymphadenopathy or easy bruising Neurological:Denies numbness, tingling or new weaknesses Behavioral/Psych: Mood is stable, no new changes  All other systems were reviewed with the patient and are negative.  I have reviewed the past medical history, past surgical history, social history and family history with the patient and they are unchanged from previous note.  ALLERGIES:  is allergic to glucophage; zetia; fenofibrate; and niacin-lovastatin er.  MEDICATIONS:  Current Outpatient Prescriptions  Medication Sig Dispense Refill  . acetaminophen (TYLENOL) 500 MG tablet Take 500 mg by  mouth every 6 (six) hours as needed for pain.    Marland Kitchen clopidogrel (PLAVIX) 75 MG tablet TAKE 1 TABLET BY MOUTH DAILY WITH BREAKFAST.  1  . doxazosin (CARDURA) 4 MG tablet Take 1 tablet (4 mg total) by mouth daily. 30 tablet 5  . eltrombopag (PROMACTA) 50 MG tablet Take 1 tablet (50 mg total) by mouth daily. Take on an empty stomach 1 hour before a meal or 2 hours after 30 tablet 6  . HYDROcodone-acetaminophen (NORCO) 5-325 MG tablet Take 1 tablet by mouth every 6 (six) hours as needed for moderate pain. 30 tablet 0  . KLOR-CON M20 20 MEQ tablet TAKE 1 TABLET (20 MEQ TOTAL) BY MOUTH DAILY. 30 tablet 11  . LANTUS SOLOSTAR 100 UNIT/ML Solostar Pen INJECT 25 UNITS INTO THE SKIN AT BEDTIME. 15 pen 3  . meclizine (ANTIVERT) 12.5 MG tablet Take 1 tablet (12.5 mg total) by mouth 3 (three) times daily as needed for dizziness. 30 tablet 0  . metoCLOPramide (REGLAN) 5 MG tablet Take 1 tablet (5 mg total) by mouth 3 (three) times daily before meals. 90 tablet 0  . metoprolol succinate (TOPROL-XL) 100 MG 24 hr tablet TAKE 1 TABLET DAILY WITH A MEAL OR IMMEDIATELY FOLLOWING A MEAL 90 tablet 3  . quinapril (ACCUPRIL) 40 MG tablet Take 40 mg by mouth daily.  0  . tamsulosin (FLOMAX) 0.4 MG CAPS capsule Take 0.4 mg by mouth daily.  0   Current Facility-Administered Medications  Medication Dose Route Frequency Provider Last Rate Last Dose  . cyanocobalamin ((VITAMIN B-12)) injection 1,000 mcg  1,000 mcg Intramuscular Q30 days [name], MD   1,000 mcg at 07/28/15 1421    PHYSICAL EXAMINATION: ECOG PERFORMANCE STATUS: 3 - Symptomatic, >50% confined to bed  Filed Vitals:  08/08/15 1409  BP: 135/60  Pulse: 91  Temp: 97.7 F (36.5 C)  Resp: 18   Filed Weights   08/08/15 1409  Weight: 202 lb (91.627 kg)    GENERAL:alert, no distress and comfortable. He appears slightly obese SKIN: skin color, texture, turgor are normal, no rashes or significant lesions. Noticed some old bruises EYES: normal,  Conjunctiva are  pale and non-injected, sclera clear OROPHARYNX:no exudate, no erythema and lips, buccal mucosa, and tongue normal  Musculoskeletal:no cyanosis of digits and no clubbing  NEURO: alert & oriented x 3 with fluent speech, no focal motor/sensory deficits  LABORATORY DATA:  I have reviewed the data as listed    Component Value Date/Time   NA 138 07/02/2015 0841   NA 139 04/07/2015 1328   K 4.3 07/02/2015 0841   K 3.1* 04/07/2015 1328   CL 106 07/02/2015 0841   CL 107 09/27/2012 0940   CO2 24 07/02/2015 0841   CO2 28 04/07/2015 1328   GLUCOSE 150* 07/02/2015 0841   GLUCOSE 210* 04/07/2015 1328   GLUCOSE 89 09/27/2012 0940   BUN 15 07/02/2015 0841   BUN 11.9 04/07/2015 1328   CREATININE 1.34* 07/02/2015 0841   CREATININE 1.4* 04/07/2015 1328   CREATININE 1.64* 09/10/2011 1204   CALCIUM 8.5* 07/02/2015 0841   CALCIUM 8.1* 04/07/2015 1328   PROT 7.1 07/02/2015 0841   PROT 7.2 04/07/2015 1328   ALBUMIN 3.5* 07/02/2015 0841   ALBUMIN 3.1* 04/07/2015 1328   AST 12 07/02/2015 0841   AST 11 04/07/2015 1328   ALT 10 07/02/2015 0841   ALT 11 04/07/2015 1328   ALKPHOS 77 07/02/2015 0841   ALKPHOS 72 04/07/2015 1328   BILITOT 0.7 07/02/2015 0841   BILITOT 0.36 04/07/2015 1328   GFRNONAA 49* 07/02/2015 0841   GFRNONAA 51* 01/19/2011 0605   GFRAA 57* 07/02/2015 0841   GFRAA >60 01/19/2011 0605    No results found for: SPEP, UPEP  Lab Results  Component Value Date   WBC 4.0 08/08/2015   NEUTROABS 1.9 08/08/2015   HGB 10.3* 08/08/2015   HCT 34.9* 08/08/2015   MCV 81.0 08/08/2015   PLT 50* 08/08/2015      Chemistry      Component Value Date/Time   NA 138 07/02/2015 0841   NA 139 04/07/2015 1328   K 4.3 07/02/2015 0841   K 3.1* 04/07/2015 1328   CL 106 07/02/2015 0841   CL 107 09/27/2012 0940   CO2 24 07/02/2015 0841   CO2 28 04/07/2015 1328   BUN 15 07/02/2015 0841   BUN 11.9 04/07/2015 1328   CREATININE 1.34* 07/02/2015 0841   CREATININE 1.4*  04/07/2015 1328   CREATININE 1.64* 09/10/2011 1204      Component Value Date/Time   CALCIUM 8.5* 07/02/2015 0841   CALCIUM 8.1* 04/07/2015 1328   ALKPHOS 77 07/02/2015 0841   ALKPHOS 72 04/07/2015 1328   AST 12 07/02/2015 0841   AST 11 04/07/2015 1328   ALT 10 07/02/2015 0841   ALT 11 04/07/2015 1328   BILITOT 0.7 07/02/2015 0841   BILITOT 0.36 04/07/2015 1328     ASSESSMENT & PLAN:  MDS (myelodysplastic syndrome) with 5q deletion He tolerated treatment with Promacta well. His insurance refused to pay for Revlimid. He had resolution of leukopenia and the platelet count has improved well over 50,000. Anemia has remained the same as it is multifactorial. The patient also has anemia of chronic kidney disease. He was noted to have microcytosis and I recommend multivitamin  supplement. His primary care doctor is prescribing vitamin B-12 injections.  he is taking oral iron supplement  with the stability of his blood count, I will see him in 3 months  Thrombocytopenia This is chronic related to myelodysplastic syndrome.  Last PET scan did not show evidence of splenomegaly He is currently getting vitamin B-12 replacement therapy.  He is not symptomatic apart from easy bruising. Continue close monitoring  Diarrhea  He had complained of intermittent diarrhea. He noted that the patient is taking Reglan. I recommend close monitoring of his diet and consider reducing the Reglan to twice a day if he continues to have severe diarrhea  Anorexia  His wife noted poor appetite. The patient usually sleep more than 12 hours a day and is not active. He has not lost any weight. I explained to the patient and family that his oral intake history of a day is correlated with his physical activity. The patient appears to be motivated to go out do more when the weather warms up. I reassured the patient and family that he appears healthy   No orders of the defined types were placed in this  encounter.   All questions were answered. The patient knows to call the clinic with any problems, questions or concerns. No barriers to learning was detected. I spent 15 minutes counseling the patient face to face. The total time spent in the appointment was 20 minutes and more than 50% was on counseling and review of test results     [facility], [place], MD 08/08/2015 2:38 PM

## 2015-08-08 NOTE — Telephone Encounter (Signed)
per pof to sch pt appt-gave pt copy of avs °

## 2015-08-08 NOTE — Assessment & Plan Note (Signed)
His wife noted poor appetite. The patient usually sleep more than 12 hours a day and is not active. He has not lost any weight. I explained to the patient and family that his oral intake history of a day is correlated with his physical activity. The patient appears to be motivated to go out do more when the weather warms up. I reassured the patient and family that he appears healthy

## 2015-08-08 NOTE — Assessment & Plan Note (Signed)
He tolerated treatment with Promacta well. His insurance refused to pay for Revlimid. He had resolution of leukopenia and the platelet count has improved well over 50,000. Anemia has remained the same as it is multifactorial. The patient also has anemia of chronic kidney disease. He was noted to have microcytosis and I recommend multivitamin supplement. His primary care doctor is prescribing vitamin B-12 injections.  he is taking oral iron supplement  with the stability of his blood count, I will see him in 3 months

## 2015-08-08 NOTE — Assessment & Plan Note (Signed)
This is chronic related to myelodysplastic syndrome.  Last PET scan did not show evidence of splenomegaly He is currently getting vitamin B-12 replacement therapy.  He is not symptomatic apart from easy bruising. Continue close monitoring

## 2015-08-26 ENCOUNTER — Ambulatory Visit (INDEPENDENT_AMBULATORY_CARE_PROVIDER_SITE_OTHER): Payer: Medicare Other | Admitting: Family Medicine

## 2015-08-26 ENCOUNTER — Ambulatory Visit (INDEPENDENT_AMBULATORY_CARE_PROVIDER_SITE_OTHER): Payer: Medicare Other | Admitting: Podiatry

## 2015-08-26 ENCOUNTER — Encounter: Payer: Self-pay | Admitting: Podiatry

## 2015-08-26 ENCOUNTER — Telehealth: Payer: Self-pay | Admitting: Pharmacist

## 2015-08-26 DIAGNOSIS — M79676 Pain in unspecified toe(s): Secondary | ICD-10-CM

## 2015-08-26 DIAGNOSIS — D519 Vitamin B12 deficiency anemia, unspecified: Secondary | ICD-10-CM | POA: Diagnosis not present

## 2015-08-26 DIAGNOSIS — B351 Tinea unguium: Secondary | ICD-10-CM | POA: Diagnosis not present

## 2015-08-26 MED ORDER — CYANOCOBALAMIN 1000 MCG/ML IJ SOLN
1000.0000 ug | INTRAMUSCULAR | Status: DC
  Administered 2015-08-26 – 2016-02-27 (×4): 1000 ug via INTRAMUSCULAR

## 2015-08-26 NOTE — Progress Notes (Signed)
Patient ID: Ricardo Watkins, male   DOB: 1930/12/29, 82 y.o.   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

## 2015-08-26 NOTE — Patient Instructions (Signed)
Diabetes and Foot Care Diabetes may cause you to have problems because of poor blood supply (circulation) to your feet and legs. This may cause the skin on your feet to become thinner, break easier, and heal more slowly. Your skin may become dry, and the skin may peel and crack. You may also have nerve damage in your legs and feet causing decreased feeling in them. You may not notice minor injuries to your feet that could lead to infections or more serious problems. Taking care of your feet is one of the most important things you can do for yourself.  HOME CARE INSTRUCTIONS  Wear shoes at all times, even in the house. Do not go barefoot. Bare feet are easily injured.  Check your feet daily for blisters, cuts, and redness. If you cannot see the bottom of your feet, use a mirror or ask someone for help.  Wash your feet with warm water (do not use hot water) and mild soap. Then pat your feet and the areas between your toes until they are completely dry. Do not soak your feet as this can dry your skin.  Apply a moisturizing lotion or petroleum jelly (that does not contain alcohol and is unscented) to the skin on your feet and to dry, brittle toenails. Do not apply lotion between your toes.  Trim your toenails straight across. Do not dig under them or around the cuticle. File the edges of your nails with an emery board or nail file.  Do not cut corns or calluses or try to remove them with medicine.  Wear clean socks or stockings every day. Make sure they are not too tight. Do not wear knee-high stockings since they may decrease blood flow to your legs.  Wear shoes that fit properly and have enough cushioning. To break in new shoes, wear them for just a few hours a day. This prevents you from injuring your feet. Always look in your shoes before you put them on to be sure there are no objects inside.  Do not cross your legs. This may decrease the blood flow to your feet.  If you find a minor scrape,  cut, or break in the skin on your feet, keep it and the skin around it clean and dry. These areas may be cleansed with mild soap and water. Do not cleanse the area with peroxide, alcohol, or iodine.  When you remove an adhesive bandage, be sure not to damage the skin around it.  If you have a wound, look at it several times a day to make sure it is healing.  Do not use heating pads or hot water bottles. They may burn your skin. If you have lost feeling in your feet or legs, you may not know it is happening until it is too late.  Make sure your health care provider performs a complete foot exam at least annually or more often if you have foot problems. Report any cuts, sores, or bruises to your health care provider immediately. SEEK MEDICAL CARE IF:   You have an injury that is not healing.  You have cuts or breaks in the skin.  You have an ingrown nail.  You notice redness on your legs or feet.  You feel burning or tingling in your legs or feet.  You have pain or cramps in your legs and feet.  Your legs or feet are numb.  Your feet always feel cold. SEEK IMMEDIATE MEDICAL CARE IF:   There is increasing redness,   swelling, or pain in or around a wound.  There is a red line that goes up your leg.  Pus is coming from a wound.  You develop a fever or as directed by your health care provider.  You notice a bad smell coming from an ulcer or wound.   This information is not intended to replace advice given to you by your health care provider. Make sure you discuss any questions you have with your health care provider.   Document Released: 06/04/2000 Document Revised: 02/07/2013 Document Reviewed: 11/14/2012 Elsevier Interactive Patient Education 2016 [employer]  

## 2015-08-26 NOTE — Telephone Encounter (Signed)
..  Oral Chemotherapy Follow-Up Form  Original Start date of oral chemotherapy:  10/12/16_______   Called patient today to follow up regarding patient's oral chemotherapy medication: _Promacta___  Pt is doing well today. He reports that he is doing fine, no problems with his medication.  Pt reports __0__ tablets/doses missed in the last week/month.        Will follow up and call patient again in __4 weeks_______   Thank you,  Theone Murdoch, PharmD, [facility]

## 2015-08-27 ENCOUNTER — Encounter: Payer: Self-pay | Admitting: Hematology and Oncology

## 2015-08-27 NOTE — Progress Notes (Signed)
Per [facility] promacta shipped via fedex 08/25/15

## 2015-09-01 ENCOUNTER — Telehealth: Payer: Self-pay | Admitting: Family Medicine

## 2015-09-01 MED ORDER — CLOPIDOGREL BISULFATE 75 MG PO TABS: ORAL_TABLET | ORAL | Status: DC

## 2015-09-01 NOTE — Telephone Encounter (Signed)
Medication called/sent to requested pharmacy  

## 2015-09-01 NOTE — Telephone Encounter (Signed)
Pt's daughter stopped by to request a refill of Clopidogrel 75 mg. [facility] [address] [phone]

## 2015-09-17 ENCOUNTER — Other Ambulatory Visit: Payer: Self-pay | Admitting: Family Medicine

## 2015-09-17 MED ORDER — QUINAPRIL HCL 20 MG PO TABS: 20.0000 mg | ORAL_TABLET | Freq: Every day | ORAL | Status: DC

## 2015-09-23 ENCOUNTER — Telehealth: Payer: Self-pay | Admitting: Pharmacist

## 2015-09-23 NOTE — Telephone Encounter (Signed)
09/23/15: Attempted to reach patient for follow up on oral medication: Promacta. No answer. Left VM for patient to call back with any questions or issues.   Thank you,  Montel Clock, PharmD, [facility] [phone]

## 2015-09-29 ENCOUNTER — Ambulatory Visit (INDEPENDENT_AMBULATORY_CARE_PROVIDER_SITE_OTHER): Payer: Medicare Other | Admitting: *Deleted

## 2015-09-29 DIAGNOSIS — E538 Deficiency of other specified B group vitamins: Secondary | ICD-10-CM | POA: Diagnosis not present

## 2015-09-29 MED ORDER — CYANOCOBALAMIN 1000 MCG/ML IJ SOLN
1000.0000 ug | Freq: Once | INTRAMUSCULAR | Status: AC
  Administered 2015-09-29: 1000 ug via INTRAMUSCULAR

## 2015-10-16 ENCOUNTER — Other Ambulatory Visit: Payer: Self-pay | Admitting: Family Medicine

## 2015-10-16 NOTE — Telephone Encounter (Signed)
Refill appropriate and filled per protocol. 

## 2015-10-20 ENCOUNTER — Other Ambulatory Visit: Payer: Self-pay | Admitting: *Deleted

## 2015-10-20 MED ORDER — ELTROMBOPAG OLAMINE 50 MG PO TABS: 50.0000 mg | ORAL_TABLET | Freq: Every day | ORAL | Status: DC

## 2015-10-21 ENCOUNTER — Encounter: Payer: Self-pay | Admitting: Pharmacist

## 2015-10-21 ENCOUNTER — Other Ambulatory Visit: Payer: Self-pay | Admitting: Family Medicine

## 2015-10-21 MED ORDER — GLUCOSE BLOOD VI STRP: 1.0000 | ORAL_STRIP | Freq: Every day | Status: DC

## 2015-10-21 NOTE — Progress Notes (Signed)
10/21/15: Attempted to reach patient for follow up on oral medication: Promacta. No answer. Left VM for patient to call back with any questions or issues.   Thank you,  Montel Clock, PharmD, [facility] [phone]

## 2015-10-27 ENCOUNTER — Encounter: Payer: Self-pay | Admitting: Hematology and Oncology

## 2015-10-27 NOTE — Progress Notes (Signed)
Per [facility] promacta shipped via fed ex 10/24/15

## 2015-10-30 ENCOUNTER — Ambulatory Visit (INDEPENDENT_AMBULATORY_CARE_PROVIDER_SITE_OTHER): Payer: Medicare Other | Admitting: *Deleted

## 2015-10-30 DIAGNOSIS — D519 Vitamin B12 deficiency anemia, unspecified: Secondary | ICD-10-CM

## 2015-10-30 MED ORDER — CYANOCOBALAMIN 1000 MCG/ML IJ SOLN
1000.0000 ug | Freq: Once | INTRAMUSCULAR | Status: AC
  Administered 2015-10-30: 1000 ug via INTRAMUSCULAR

## 2015-10-30 NOTE — Progress Notes (Signed)
Patient ID: Ricardo Watkins, male   DOB: 07/09/1930, 82 y.o.   MRN: [id number] Patient seen in office for Vitamin B 12 injection.   Tolerated IM administration well.   

## 2015-10-31 ENCOUNTER — Ambulatory Visit: Payer: Medicare Other | Admitting: Hematology and Oncology

## 2015-10-31 ENCOUNTER — Other Ambulatory Visit: Payer: Medicare Other

## 2015-11-04 ENCOUNTER — Encounter: Payer: Self-pay | Admitting: Hematology and Oncology

## 2015-11-04 ENCOUNTER — Ambulatory Visit (HOSPITAL_BASED_OUTPATIENT_CLINIC_OR_DEPARTMENT_OTHER): Payer: Medicare Other | Admitting: Hematology and Oncology

## 2015-11-04 ENCOUNTER — Other Ambulatory Visit (HOSPITAL_BASED_OUTPATIENT_CLINIC_OR_DEPARTMENT_OTHER): Payer: Medicare Other

## 2015-11-04 ENCOUNTER — Telehealth: Payer: Self-pay | Admitting: Hematology and Oncology

## 2015-11-04 ENCOUNTER — Ambulatory Visit (HOSPITAL_COMMUNITY)
Admission: RE | Admit: 2015-11-04 | Discharge: 2015-11-04 | Disposition: A | Discharge status: Home / Self Care | Payer: Medicare Other | Source: Ambulatory Visit | Attending: Hematology and Oncology | Admitting: Hematology and Oncology

## 2015-11-04 ENCOUNTER — Other Ambulatory Visit: Payer: Self-pay | Admitting: Hematology and Oncology

## 2015-11-04 VITALS — BP 152/68 | HR 75 | Temp 97.5°F | Resp 18 | Ht 69.0 in | Wt 194.1 lb

## 2015-11-04 DIAGNOSIS — J90 Pleural effusion, not elsewhere classified: Secondary | ICD-10-CM

## 2015-11-04 DIAGNOSIS — R05 Cough: Secondary | ICD-10-CM

## 2015-11-04 DIAGNOSIS — I700 Atherosclerosis of aorta: Secondary | ICD-10-CM | POA: Diagnosis not present

## 2015-11-04 DIAGNOSIS — D6959 Other secondary thrombocytopenia: Secondary | ICD-10-CM

## 2015-11-04 DIAGNOSIS — N183 Chronic kidney disease, stage 3 unspecified: Secondary | ICD-10-CM

## 2015-11-04 DIAGNOSIS — R053 Chronic cough: Secondary | ICD-10-CM

## 2015-11-04 DIAGNOSIS — D46C Myelodysplastic syndrome with isolated del(5q) chromosomal abnormality: Secondary | ICD-10-CM | POA: Diagnosis present

## 2015-11-04 DIAGNOSIS — D696 Thrombocytopenia, unspecified: Secondary | ICD-10-CM

## 2015-11-04 DIAGNOSIS — Z8572 Personal history of non-Hodgkin lymphomas: Secondary | ICD-10-CM

## 2015-11-04 DIAGNOSIS — Z8579 Personal history of other malignant neoplasms of lymphoid, hematopoietic and related tissues: Secondary | ICD-10-CM

## 2015-11-04 HISTORY — DX: Pleural effusion, not elsewhere classified: J90

## 2015-11-04 HISTORY — DX: Chronic cough: R05.3

## 2015-11-04 LAB — CBC WITH DIFFERENTIAL/PLATELET
BASO%: 0.2 % (ref 0.0–2.0)
BASOS ABS: 0 10*3/uL (ref 0.0–0.1)
EOS ABS: 0.1 10*3/uL (ref 0.0–0.5)
EOS%: 0.9 % (ref 0.0–7.0)
HCT: 37.1 % — ABNORMAL LOW (ref 38.4–49.9)
HGB: 11.3 g/dL — ABNORMAL LOW (ref 13.0–17.1)
LYMPH%: 43.2 % (ref 14.0–49.0)
MCH: 24.1 pg — ABNORMAL LOW (ref 27.2–33.4)
MCHC: 30.5 g/dL — ABNORMAL LOW (ref 32.0–36.0)
MCV: 79.1 fL — ABNORMAL LOW (ref 79.3–98.0)
MONO#: 0.4 10*3/uL (ref 0.1–0.9)
MONO%: 7.2 % (ref 0.0–14.0)
NEUT%: 48.5 % (ref 39.0–75.0)
NEUTROS ABS: 2.8 10*3/uL (ref 1.5–6.5)
PLATELETS: 52 10*3/uL — AB (ref 140–400)
RBC: 4.69 10*6/uL (ref 4.20–5.82)
RDW: 23.8 % — ABNORMAL HIGH (ref 11.0–14.6)
WBC: 5.8 10*3/uL (ref 4.0–10.3)
lymph#: 2.5 10*3/uL (ref 0.9–3.3)
nRBC: 0 % (ref 0–0)

## 2015-11-04 LAB — TECHNOLOGIST REVIEW

## 2015-11-04 NOTE — Assessment & Plan Note (Signed)
He tolerated treatment with Promacta well. His insurance refused to pay for Revlimid. He had resolution of leukopenia and the platelet count has improved well over 50,000. Anemia has improved with multivitamin supplement and vitamin B-12 injections. He is taking oral iron supplement He will continue the same

## 2015-11-04 NOTE — Assessment & Plan Note (Signed)
According to his wife, he has chronic cough for several months. It is difficult to tease out aggravating factors for his cough.  Sometimes, she thought that he might have choking with food.  Aspiration is a possibility.  The patient is a taking an ACE inhibitor. I recommended a chest x-ray today which subsequently showed pleural effusion. Examination is benign and hence I do not think there is enough fluid for thoracentesis. I will order a CT scan for further evaluation

## 2015-11-04 NOTE — Assessment & Plan Note (Signed)
This is chronic related to myelodysplastic syndrome.  Last PET scan did not show evidence of splenomegaly He is currently getting vitamin B-12 replacement therapy.  He is not symptomatic apart from easy bruising. Continue close monitoring

## 2015-11-04 NOTE — Telephone Encounter (Signed)
Gave pt apt & avs °

## 2015-11-04 NOTE — Assessment & Plan Note (Signed)
The patient have chronic kidney disease. His creatinine is stable. Continue medical management. To reduce the risks of contrast nephropathy, I recommend increase oral fluid intake after CT scan and to hold ACE inhibitor the day before, the day of and the day of the CT scan

## 2015-11-04 NOTE — Assessment & Plan Note (Signed)
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 He has no signs of disease I generally do not recommend routine imaging studies However, with abnormal CXR, I recommend CT imaging next week to assess

## 2015-11-04 NOTE — Telephone Encounter (Signed)
spoke w/ pt confirmed 5/24 appt

## 2015-11-04 NOTE — Assessment & Plan Note (Signed)
He has new pleural effusion. Examination is benign and he has no evidence of hypoxia. I will order a CT scan for further evaluation.

## 2015-11-04 NOTE — Progress Notes (Signed)
[name] OFFICE PROGRESS NOTE  Patient Care Team: Ricardo Frizzle, MD as PCP - General (Family Medicine) Ricardo Lark, MD as Consulting Physician (Hematology and Oncology)  SUMMARY OF ONCOLOGIC HISTORY: Oncology History   Calculated R-IPSS score at low risk     MDS (myelodysplastic syndrome) with 5q deletion ([place])   11/07/2014 Bone Marrow Biopsy Accession: [id number] marrow biopsy showed myelodysplastic syndrome with 5q deletion.   04/02/2015 -  Chemotherapy He started taking Promacta daily for thrombocytopenia   Ricardo Watkins was transferred to my care after his prior physician has left.  I reviewed the patient's records extensive and collaborated the history with the patient. Summary of his history is as follows: He was initially diagnosed with follicular grade 2, B-cell, non-Hodgkin's lymphoma in August 2000 when he presented with bulky retroperitoneal and periaortic lymphadenopathy. He achieved a near complete response to 4 cycles of fludarabine, mitoxantrone, and dexamethasone. Due to significant myelosuppression, chemotherapy was stopped after 4 cycles. He was put on consolidation therapy with Rituxan weekly x4. All treatments were completed by 07/21/1999. He has had no signs of recurrent lymphoma since that time.  Over time he has developed other problems. He developed cancer of the prostate Gleason 3+3 right lobe, 3+ 4 left lobe, in May of 2007 treated with radioactive iodine seed implants.  He developed a modest fall in his platelet count 4 years ago consistent with chronic ITP. Platelets have stayed at or around 100,000.  He had a stroke in July 2012. There was an acute infarction in the genu and posterior limb of the right anterior capsule. 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  He has an elevated MCV of 105, a monocytosis up to 16%, borderline elevation of LDH up to 303, low bilirubin 0.35, normal serum immunoglobulin levels  and no monoclonal proteins on immunofixation electrophoresis. He had borderline decreased B12 level 194, back in April. He was started on B12 injections without any major improvement in his degree of anemia. Iron studies normal with high normal ferritin 317. Findings above are consistent with a myelodysplastic syndrome likely related to his remote chemotherapy treatments. Hemoglobin level has stabilized at 13 g. Platelets and white count stable. A CT scan done subsequent to his April 2014 visit did not show any evidence for recurrent lymphoma.  INTERVAL HISTORY: Please see below for problem oriented charting. He returns today for his 3 months follow-up. There were multiple complaints today. He has lost a little weight. The patient's wife said that he has constant, persistent nonproductive cough.  His wife cannot tell me whether the cough is related to food although she thought he might have occasional choking sensation It was not associated with any nasal drainage, sore throat, fevers, hemoptysis. There were no reported lymphadenopathy. He denies pain. He has extensive bruises due to pancytopenia. He continues to have chronic fatigue and sleep a lot during daytime. He continues to have chronic diarrhea with occasional incontinence.  She does not know whether the diarrhea could be related to food. He denies abdominal cramps.  REVIEW OF SYSTEMS:   Constitutional: Denies fevers, chills  Eyes: Denies blurriness of vision Ears, nose, mouth, throat, and face: Denies mucositis or sore throat Cardiovascular: Denies palpitation, chest discomfort or lower extremity swelling Skin: Denies abnormal skin rashes Lymphatics: Denies new lymphadenopathy Neurological:Denies numbness, tingling or new weaknesses Behavioral/Psych: Mood is stable, no new changes  All other systems were reviewed with the patient and are negative.  I have  reviewed the past medical history, past surgical history, social history and  family history with the patient and they are unchanged from previous note.  ALLERGIES:  is allergic to glucophage; zetia; fenofibrate; and niacin-lovastatin er.  MEDICATIONS:  Current Outpatient Prescriptions  Medication Sig Dispense Refill  . acetaminophen (TYLENOL) 500 MG tablet Take 500 mg by mouth every 6 (six) hours as needed for pain.    Marland Kitchen clopidogrel (PLAVIX) 75 MG tablet TAKE 1 TABLET BY MOUTH DAILY WITH BREAKFAST. 90 tablet 4  . doxazosin (CARDURA) 4 MG tablet TAKE 1 TABLET BY MOUTH ONCE A DAY 30 tablet 3  . eltrombopag (PROMACTA) 50 MG tablet Take 1 tablet (50 mg total) by mouth daily. Take on an empty stomach 1 hour before a meal or 2 hours after 30 tablet 6  . glucose blood test strip 1 each by Other route daily. Fasting each morning 100 each 3  . HYDROcodone-acetaminophen (NORCO) 5-325 MG tablet Take 1 tablet by mouth every 6 (six) hours as needed for moderate pain. 30 tablet 0  . KLOR-CON M20 20 MEQ tablet TAKE 1 TABLET (20 MEQ TOTAL) BY MOUTH DAILY. 30 tablet 11  . LANTUS SOLOSTAR 100 UNIT/ML Solostar Pen INJECT 25 UNITS INTO THE SKIN AT BEDTIME. 15 pen 3  . meclizine (ANTIVERT) 12.5 MG tablet Take 1 tablet (12.5 mg total) by mouth 3 (three) times daily as needed for dizziness. 30 tablet 0  . metoCLOPramide (REGLAN) 5 MG tablet Take 1 tablet (5 mg total) by mouth 3 (three) times daily before meals. 90 tablet 0  . metoprolol succinate (TOPROL-XL) 100 MG 24 hr tablet TAKE 1 TABLET DAILY WITH A MEAL OR IMMEDIATELY FOLLOWING A MEAL 90 tablet 3  . quinapril (ACCUPRIL) 20 MG tablet Take 1 tablet (20 mg total) by mouth at bedtime. 90 tablet 4  . tamsulosin (FLOMAX) 0.4 MG CAPS capsule Take 0.4 mg by mouth daily.  0   Current Facility-Administered Medications  Medication Dose Route Frequency Provider Last Rate Last Dose  . cyanocobalamin ((VITAMIN B-12)) injection 1,000 mcg  1,000 mcg Intramuscular Q30 days Ricardo Frizzle, MD   1,000 mcg at 08/26/15 1432    PHYSICAL  EXAMINATION: ECOG PERFORMANCE STATUS: 1 - Symptomatic but completely ambulatory  Filed Vitals:   11/04/15 1416  BP: 152/68  Pulse: 75  Temp: 97.5 F (36.4 C)  Resp: 18   Filed Weights   11/04/15 1416  Weight: 194 lb 1.6 oz (88.043 kg)    GENERAL:alert, no distress and comfortable SKIN: Extensive bruises are noted. EYES: normal, Conjunctiva are pink and non-injected, sclera clear OROPHARYNX:no exudate, no erythema and lips, buccal mucosa, and tongue normal  NECK: supple, thyroid normal size, non-tender, without nodularity LYMPH:  no palpable lymphadenopathy in the cervical, axillary or inguinal LUNGS: clear to auscultation and percussion with normal breathing effort HEART: regular rate & rhythm and no murmurs and no lower extremity edema ABDOMEN:abdomen soft, non-tender and normal bowel sounds Musculoskeletal:no cyanosis of digits and no clubbing  NEURO: alert & oriented x 3 with fluent speech, no focal motor/sensory deficits  LABORATORY DATA:  I have reviewed the data as listed    Component Value Date/Time   NA 138 07/02/2015 0841   NA 139 04/07/2015 1328   K 4.3 07/02/2015 0841   K 3.1* 04/07/2015 1328   CL 106 07/02/2015 0841   CL 107 09/27/2012 0940   CO2 24 07/02/2015 0841   CO2 28 04/07/2015 1328   GLUCOSE 150* 07/02/2015 0841   GLUCOSE  210* 04/07/2015 1328   GLUCOSE 89 09/27/2012 0940   BUN 15 07/02/2015 0841   BUN 11.9 04/07/2015 1328   CREATININE 1.34* 07/02/2015 0841   CREATININE 1.4* 04/07/2015 1328   CREATININE 1.64* 09/10/2011 1204   CALCIUM 8.5* 07/02/2015 0841   CALCIUM 8.1* 04/07/2015 1328   PROT 7.1 07/02/2015 0841   PROT 7.2 04/07/2015 1328   ALBUMIN 3.5* 07/02/2015 0841   ALBUMIN 3.1* 04/07/2015 1328   AST 12 07/02/2015 0841   AST 11 04/07/2015 1328   ALT 10 07/02/2015 0841   ALT 11 04/07/2015 1328   ALKPHOS 77 07/02/2015 0841   ALKPHOS 72 04/07/2015 1328   BILITOT 0.7 07/02/2015 0841   BILITOT 0.36 04/07/2015 1328   GFRNONAA 49*  07/02/2015 0841   GFRNONAA 51* 01/19/2011 0605   GFRAA 57* 07/02/2015 0841   GFRAA >60 01/19/2011 0605    No results found for: SPEP, UPEP  Lab Results  Component Value Date   WBC 5.8 11/04/2015   NEUTROABS 2.8 11/04/2015   HGB 11.3* 11/04/2015   HCT 37.1* 11/04/2015   MCV 79.1* 11/04/2015   PLT 52* 11/04/2015      Chemistry      Component Value Date/Time   NA 138 07/02/2015 0841   NA 139 04/07/2015 1328   K 4.3 07/02/2015 0841   K 3.1* 04/07/2015 1328   CL 106 07/02/2015 0841   CL 107 09/27/2012 0940   CO2 24 07/02/2015 0841   CO2 28 04/07/2015 1328   BUN 15 07/02/2015 0841   BUN 11.9 04/07/2015 1328   CREATININE 1.34* 07/02/2015 0841   CREATININE 1.4* 04/07/2015 1328   CREATININE 1.64* 09/10/2011 1204      Component Value Date/Time   CALCIUM 8.5* 07/02/2015 0841   CALCIUM 8.1* 04/07/2015 1328   ALKPHOS 77 07/02/2015 0841   ALKPHOS 72 04/07/2015 1328   AST 12 07/02/2015 0841   AST 11 04/07/2015 1328   ALT 10 07/02/2015 0841   ALT 11 04/07/2015 1328   BILITOT 0.7 07/02/2015 0841   BILITOT 0.36 04/07/2015 1328       RADIOGRAPHIC STUDIES: I have personally reviewed the radiological images as listed and agreed with the findings in the report. Dg Chest 2 View  11/04/2015  CLINICAL DATA:  82 year old male with a history of persistent dry cough for 2 months. EXAM: CHEST  2 VIEW COMPARISON:  Chest x-ray 08/07/2010, PET-CT 10/30/2014 FINDINGS: Cardiomediastinal silhouette appears within normal limits, though the left heart border is partially obscured by overlying lung/pleural disease. Atherosclerosis of the aortic arch. No interlobular septal thickening or congestion of the central vasculature. No pneumothorax. Dense opacity at the left base obscuring the left heart border. Blunting of the left costophrenic angle, with opacification of the costophrenic sulcus on the lateral view. Degenerative changes of the spine. No displaced fracture. IMPRESSION: New moderate  left-sided pleural effusion, indeterminate etiology. No evidence of edema/ CHF. Atherosclerosis. Signed, Dulcy Fanny. Earleen Newport, DO [facility] Electronically Signed   By: Corrie Mckusick D.O.   On: 11/04/2015 15:49     ASSESSMENT & PLAN:  MDS (myelodysplastic syndrome) with 5q deletion He tolerated treatment with Promacta well. His insurance refused to pay for Revlimid. He had resolution of leukopenia and the platelet count has improved well over 50,000. Anemia has improved with multivitamin supplement and vitamin B-12 injections. He is taking oral iron supplement He will continue the same  History of lymphoma The patient have follicular lymphoma treated in the past.  He has no signs of disease I generally do not recommend routine imaging studies However, with abnormal CXR, I recommend CT imaging next week to assess  Thrombocytopenia This is chronic related to myelodysplastic syndrome.  Last PET scan did not show evidence of splenomegaly He is currently getting vitamin B-12 replacement therapy.  He is not symptomatic apart from easy bruising. Continue close monitoring  Chronic renal failure, stage 3 (moderate) The patient have chronic kidney disease. His creatinine is stable. Continue medical management. To reduce the risks of contrast nephropathy, I recommend increase oral fluid intake after CT scan and to hold ACE inhibitor the day before, the day of and the day of the CT scan  Cough, persistent According to his wife, he has chronic cough for several months. It is difficult to tease out aggravating factors for his cough.  Sometimes, she thought that he might have choking with food.  Aspiration is a possibility.  The patient is a taking an ACE inhibitor. I recommended a chest x-ray today which subsequently showed pleural effusion. Examination is benign and hence I do not think there is enough fluid for thoracentesis. I will  order a CT scan for further evaluation  Pleural effusion, left He has new pleural effusion. Examination is benign and he has no evidence of hypoxia. I will order a CT scan for further evaluation.   Orders Placed This Encounter  Procedures  . DG Chest 2 View    Standing Status: Future     Number of Occurrences: 1     Standing Expiration Date: 12/08/2016    Order Specific Question:  Reason for exam:    Answer:  cough, prostate ca    Order Specific Question:  Preferred imaging location?    Answer:  [facility]   All questions were answered. The patient knows to call the clinic with any problems, questions or concerns. No barriers to learning was detected. I spent 25 minutes counseling the patient face to face. The total time spent in the appointment was 40 minutes and more than 50% was on counseling and review of test results     [facility], [place], MD 11/04/2015 5:57 PM

## 2015-11-06 ENCOUNTER — Telehealth: Payer: Self-pay | Admitting: *Deleted

## 2015-11-06 NOTE — Telephone Encounter (Signed)
Pt scheduled for CT Chest on 5/23 at 3 pm.  Needs lab prior for Creatinine.  Instructed pt to come to [facility] at 2 pm for lab first.  POF sent to add lab.  Instructed no food for 4 hrs prior to scan, liquids only.  He verbalized understanding.

## 2015-11-11 ENCOUNTER — Other Ambulatory Visit: Payer: Self-pay | Admitting: Hematology and Oncology

## 2015-11-11 ENCOUNTER — Ambulatory Visit (HOSPITAL_COMMUNITY)
Admission: RE | Admit: 2015-11-11 | Discharge: 2015-11-11 | Disposition: A | Discharge status: Home / Self Care | Payer: Medicare Other | Source: Ambulatory Visit | Attending: Hematology and Oncology | Admitting: Hematology and Oncology

## 2015-11-11 ENCOUNTER — Other Ambulatory Visit: Payer: Self-pay | Admitting: *Deleted

## 2015-11-11 ENCOUNTER — Encounter (HOSPITAL_COMMUNITY): Payer: Self-pay

## 2015-11-11 ENCOUNTER — Other Ambulatory Visit (HOSPITAL_BASED_OUTPATIENT_CLINIC_OR_DEPARTMENT_OTHER): Payer: Medicare Other

## 2015-11-11 DIAGNOSIS — D46C Myelodysplastic syndrome with isolated del(5q) chromosomal abnormality: Secondary | ICD-10-CM | POA: Diagnosis present

## 2015-11-11 DIAGNOSIS — J948 Other specified pleural conditions: Secondary | ICD-10-CM | POA: Insufficient documentation

## 2015-11-11 DIAGNOSIS — I7090 Unspecified atherosclerosis: Secondary | ICD-10-CM | POA: Insufficient documentation

## 2015-11-11 DIAGNOSIS — I2510 Atherosclerotic heart disease of native coronary artery without angina pectoris: Secondary | ICD-10-CM | POA: Diagnosis not present

## 2015-11-11 DIAGNOSIS — R05 Cough: Secondary | ICD-10-CM | POA: Insufficient documentation

## 2015-11-11 DIAGNOSIS — R053 Chronic cough: Secondary | ICD-10-CM

## 2015-11-11 DIAGNOSIS — R590 Localized enlarged lymph nodes: Secondary | ICD-10-CM | POA: Diagnosis not present

## 2015-11-11 DIAGNOSIS — J90 Pleural effusion, not elsewhere classified: Secondary | ICD-10-CM | POA: Diagnosis not present

## 2015-11-11 LAB — COMPREHENSIVE METABOLIC PANEL
ALBUMIN: 3.2 g/dL — AB (ref 3.5–5.0)
ALK PHOS: 86 U/L (ref 40–150)
ALT: 19 U/L (ref 0–55)
ANION GAP: 6 meq/L (ref 3–11)
AST: 16 U/L (ref 5–34)
BILIRUBIN TOTAL: 0.33 mg/dL (ref 0.20–1.20)
BUN: 15.7 mg/dL (ref 7.0–26.0)
CALCIUM: 8.6 mg/dL (ref 8.4–10.4)
CO2: 26 meq/L (ref 22–29)
Chloride: 107 mEq/L (ref 98–109)
Creatinine: 1.4 mg/dL — ABNORMAL HIGH (ref 0.7–1.3)
EGFR: 44 mL/min/{1.73_m2} — AB (ref >90)
Glucose: 142 mg/dl — ABNORMAL HIGH (ref 70–140)
Potassium: 4 mEq/L (ref 3.5–5.1)
SODIUM: 139 meq/L (ref 136–145)
Total Protein: 8.6 g/dL — ABNORMAL HIGH (ref 6.4–8.3)

## 2015-11-11 LAB — CBC WITH DIFFERENTIAL/PLATELET
BASO%: 0 % (ref 0.0–2.0)
Basophils Absolute: 0 10*3/uL (ref 0.0–0.1)
EOS ABS: 0.1 10*3/uL (ref 0.0–0.5)
EOS%: 1.2 % (ref 0.0–7.0)
HEMATOCRIT: 37.7 % — AB (ref 38.4–49.9)
HEMOGLOBIN: 11.5 g/dL — AB (ref 13.0–17.1)
LYMPH%: 48.7 % (ref 14.0–49.0)
MCH: 24.2 pg — AB (ref 27.2–33.4)
MCHC: 30.5 g/dL — AB (ref 32.0–36.0)
MCV: 79.4 fL (ref 79.3–98.0)
MONO#: 0.5 10*3/uL (ref 0.1–0.9)
MONO%: 9.7 % (ref 0.0–14.0)
NEUT%: 40.4 % (ref 39.0–75.0)
NEUTROS ABS: 2 10*3/uL (ref 1.5–6.5)
Platelets: 54 10*3/uL — ABNORMAL LOW (ref 140–400)
RBC: 4.75 10*6/uL (ref 4.20–5.82)
RDW: 23.5 % — ABNORMAL HIGH (ref 11.0–14.6)
WBC: 5 10*3/uL (ref 4.0–10.3)
lymph#: 2.5 10*3/uL (ref 0.9–3.3)
nRBC: 0 % (ref 0–0)

## 2015-11-11 MED ORDER — IOPAMIDOL (ISOVUE-300) INJECTION 61%: 75.0000 mL | Freq: Once | INTRAVENOUS | Status: DC | PRN

## 2015-11-12 ENCOUNTER — Telehealth: Payer: Self-pay | Admitting: Hematology and Oncology

## 2015-11-12 ENCOUNTER — Telehealth: Payer: Self-pay | Admitting: Internal Medicine

## 2015-11-12 ENCOUNTER — Telehealth: Payer: Self-pay | Admitting: Cardiology

## 2015-11-12 ENCOUNTER — Ambulatory Visit (HOSPITAL_BASED_OUTPATIENT_CLINIC_OR_DEPARTMENT_OTHER): Payer: Medicare Other | Admitting: Hematology and Oncology

## 2015-11-12 VITALS — BP 141/63 | HR 71 | Temp 97.5°F | Resp 18 | Ht 69.0 in | Wt 194.7 lb

## 2015-11-12 DIAGNOSIS — D696 Thrombocytopenia, unspecified: Secondary | ICD-10-CM

## 2015-11-12 DIAGNOSIS — I501 Left ventricular failure: Secondary | ICD-10-CM

## 2015-11-12 DIAGNOSIS — J90 Pleural effusion, not elsewhere classified: Secondary | ICD-10-CM | POA: Diagnosis not present

## 2015-11-12 DIAGNOSIS — J810 Acute pulmonary edema: Secondary | ICD-10-CM | POA: Insufficient documentation

## 2015-11-12 NOTE — Progress Notes (Signed)
HPI  The patient presents for followup of his known coronary disease. He has a history of non-Hodgkin's lymphoma and is being treated.   He was seen by his oncologist recently and was found to have some pleural effusion and possible pulmonary edema.  CT of the chest was done. It demonstrated that this was not thought to be related to lymphoma. It was thought that there could've been some confusion related to parapneumonic effusion versus heart failure. There was some lymphadenopathy and further evaluation of this is pending. He was noted again to have atherosclerosis of his coronary arteries.  He reports that he has been having a cough for several weeks or months.  This is not particularly productive and worse at night.  He has had night sweats, weight loss, early satiety .  He has some increased dyspnea walking up an incline.  He is not describing fevers or chills.  He has had weight loss and not gain and has had chronic edema.  The patient denies any new symptoms such as chest discomfort, neck or arm discomfort.  There have been no reported palpitations, presyncope or syncope.   Allergies  Allergen Reactions  . Glucophage [Metformin Hydrochloride] Other (See Comments)    Chest pain  . Zetia [Ezetimibe] Other (See Comments)    weakness  . Fenofibrate Rash  . Niacin-Lovastatin Er Rash    Current Outpatient Prescriptions  Medication Sig Dispense Refill  . acetaminophen (TYLENOL) 500 MG tablet Take 500 mg by mouth every 6 (six) hours as needed for pain.    Marland Kitchen clopidogrel (PLAVIX) 75 MG tablet TAKE 1 TABLET BY MOUTH DAILY WITH BREAKFAST. 90 tablet 4  . doxazosin (CARDURA) 4 MG tablet TAKE 1 TABLET BY MOUTH ONCE A DAY 30 tablet 3  . eltrombopag (PROMACTA) 50 MG tablet Take 1 tablet (50 mg total) by mouth daily. Take on an empty stomach 1 hour before a meal or 2 hours after 30 tablet 6  . glucose blood test strip 1 each by Other route daily. Fasting each morning 100 each 3  .  HYDROcodone-acetaminophen (NORCO) 5-325 MG tablet Take 1 tablet by mouth every 6 (six) hours as needed for moderate pain. 30 tablet 0  . IRON PO Take 1 tablet by mouth daily.    Marland Kitchen KLOR-CON M20 20 MEQ tablet TAKE 1 TABLET (20 MEQ TOTAL) BY MOUTH DAILY. 30 tablet 11  . LANTUS SOLOSTAR 100 UNIT/ML Solostar Pen INJECT 25 UNITS INTO THE SKIN AT BEDTIME. 15 pen 3  . meclizine (ANTIVERT) 12.5 MG tablet Take 1 tablet (12.5 mg total) by mouth 3 (three) times daily as needed for dizziness. 30 tablet 0  . metoCLOPramide (REGLAN) 5 MG tablet Take 1 tablet (5 mg total) by mouth 3 (three) times daily before meals. (Patient taking differently: Take 5 mg by mouth 3 (three) times daily as needed. ) 90 tablet 0  . metoprolol succinate (TOPROL-XL) 100 MG 24 hr tablet TAKE 1 TABLET DAILY WITH A MEAL OR IMMEDIATELY FOLLOWING A MEAL 90 tablet 3  . Multiple Vitamins-Minerals (MULTIVITAMIN ADULT PO) Take 1 tablet by mouth daily.    . quinapril (ACCUPRIL) 20 MG tablet Take 1 tablet (20 mg total) by mouth at bedtime. 90 tablet 4  . tamsulosin (FLOMAX) 0.4 MG CAPS capsule Take 0.4 mg by mouth daily.  0   Current Facility-Administered Medications  Medication Dose Route Frequency Provider Last Rate Last Dose  . cyanocobalamin ((VITAMIN B-12)) injection 1,000 mcg  1,000 mcg Intramuscular Q30 days Cletus Gash  Avel Peace, MD   1,000 mcg at 08/26/15 1432    Past Medical History  Diagnosis Date  . PVD (peripheral vascular disease) ([place])     rt renal artery stent  . CAD (coronary artery disease)     30% LAD Stenosis, 70% ramus intermedius stenosis, treated with PTCA and angioplasty by Dr Albertine Patricia 2004  . DM (diabetes mellitus) ([place])   . Dyslipidemia   . Renal insufficiency   . Thrombocytopenia ([place])   . Hypertension   . Vitamin D deficiency   . Inguinal hernia     right  . Adenomatous colon polyp   . Cataract     right eye  . Hyperlipidemia   . Thrombotic stroke ([place]) 09/10/2011    January 18, 2011 infarct genu & post limb R  internal capsule - acute; previous lacunar infarcts/extensive white matter dis  . DVT (deep venous thrombosis) ([place])     secondary to surgery  . UTI (urinary tract infection)   . 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 Deficiency anemia 04/19/2014  . Diverticulosis   . Microcytic anemia 07/07/2015  . Cough, persistent 11/04/2015  . Pleural effusion, left 11/04/2015  . Non Hodgkin's lymphoma ([place])   . Prostate cancer ([place])     seed implants no recurrence  . Prostate CA ([place]) 09/10/2011    Gleason 3+3 R, 3+4 L lobe May 2007 Rx Radioactive seed implants Dr [name]    Past Surgical History  Procedure Laterality Date  . Cystourethroscopy      ROBOTIC ARM NUCLETRON SEED IMPLANTATION OF PROSTATE  . Exploratory laparotomy      For evaluation of lymphoma  . Colonoscopy    . Appendectomy      patient ?    ROS:  As stated in the HPI and negative for all other systems.  PHYSICAL EXAM BP 140/64 mmHg  Pulse 76  Ht 5\' 9"  (1.753 m)  Wt 193 lb (87.544 kg)  BMI 28.49 kg/m2 GENERAL:  Well appearing HEENT:  Pupils equal round and reactive, fundi not visualized, oral mucosa unremarkable NECK:  No jugular venous distention, waveform within normal limits, carotid upstroke brisk and symmetric, positive bilateral soft bruits versus transmitted systolic murmurs, no thyromegaly LYMPHATICS:  No cervical, inguinal adenopathy LUNGS:  Decreased breath sounds on the left with dullness to percussion. BACK:  No CVA tenderness CHEST:  Unremarkable HEART:  PMI not displaced or sustained,S1 and S2 within normal limits, no S3, no S4, no clicks, no rubs, 2/6 apical systolic murmur radiating out the aortic outflow tract short and early peaking, no diastolic murmurs ABD:  Flat, positive bowel sounds normal in frequency in pitch, no bruits, no rebound, no guarding, no midline pulsatile mass, no hepatomegaly, no splenomegaly EXT:  2 plus  pulses throughout,  mild to moderate bilateral lower extremityedema, no cyanosis no clubbing SKIN:  No rashes no nodules   EKG:  Sinus rhythm, rate 76, axis within normal limits, intervals within normal limits, poor anterior R wave progression, nonspecific T-wave flattening with PVCs. 11/13/2015  ASSESSMENT AND PLAN  CAD:  The patient has no new sypmtoms.  No further cardiovascular testing is indicated.  We will continue with aggressive risk reduction and meds as listed.  CAROTID STENOSIS:  In January he had 50-69% left stenosis and we will follow this again now  HYPERLIPIDMEIA:  His LDL in  April was 56.  He will  remain on the meds as listed.  CKD:  Creat has been stable at 1.4 two days ago  EDEMA:  We discussed conservative therapies.  This is unchanged.   PLEURAL EFFUSION:  I reviewed his CT does have a large left effusion. However I don't suspect this is related to left-sided heart failure. I will check a BNP level. He is seeing Dr. Melvyn Novas tomorrow and he can consider further management such as thoracentesis. The patient does have other worrisome complaints such as nightly sweats and weight loss. This would suggest another etiology other than heart failure. He's had a well preserved ejection fraction previously. I don't suspect clinically that his AS is worse.

## 2015-11-12 NOTE — Assessment & Plan Note (Signed)
The patient has chronic pancytopenia related to MDS. His platelet count typically is slightly over 50,000. I would defer to interventional radiologist to consider whether he would need platelet transfusion if thoracentesis is indicated. In most cases, for platelet count over 50,000, the patient should not need transfusion

## 2015-11-12 NOTE — Telephone Encounter (Signed)
Patient scheduled to see Dr. Melvyn Novas on 11/14/15 at 1:15pm per Dr. Melvyn Novas, ok to DB. Patient aware of appointment and advised to bring all medications to appointment. Nothing further needed.

## 2015-11-12 NOTE — Telephone Encounter (Signed)
Gave apt & avs

## 2015-11-12 NOTE — Progress Notes (Signed)
[place] OFFICE PROGRESS NOTE  Patient Care Team: [name], MD as PCP - General (Family Medicine) Heath Lark, MD as Consulting Physician (Hematology and Oncology) [name], MD as Consulting Physician (Cardiology)  SUMMARY OF ONCOLOGIC HISTORY: Oncology History   Calculated R-IPSS score at low risk     MDS (myelodysplastic syndrome) with 5q deletion ([place])   11/07/2014 Bone Marrow Biopsy Accession: [id number] marrow biopsy showed myelodysplastic syndrome with 5q deletion.   04/02/2015 -  Chemotherapy He started taking Promacta daily for thrombocytopenia    INTERVAL HISTORY: Please see below for problem oriented charting. He returns today with his wife and daughter. His wife stated he continues to have chronic cough. Denies fevers or chills.  REVIEW OF SYSTEMS:   Constitutional: Denies fevers, chills or abnormal weight loss Eyes: Denies blurriness of vision Ears, nose, mouth, throat, and face: Denies mucositis or sore throat Cardiovascular: Denies palpitation, chest discomfort or lower extremity swelling Gastrointestinal:  Denies nausea, heartburn or change in bowel habits Skin: Denies abnormal skin rashes Lymphatics: Denies new lymphadenopathy or easy bruising Neurological:Denies numbness, tingling or new weaknesses Behavioral/Psych: Mood is stable, no new changes  All other systems were reviewed with the patient and are negative.  I have reviewed the past medical history, past surgical history, social history and family history with the patient and they are unchanged from previous note.  ALLERGIES:  is allergic to glucophage; zetia; fenofibrate; and niacin-lovastatin er.  MEDICATIONS:  Current Outpatient Prescriptions  Medication Sig Dispense Refill  . acetaminophen (TYLENOL) 500 MG tablet Take 500 mg by mouth every 6 (six) hours as needed for pain.    Marland Kitchen clopidogrel (PLAVIX) 75 MG tablet TAKE 1 TABLET BY MOUTH DAILY WITH BREAKFAST. 90 tablet  4  . doxazosin (CARDURA) 4 MG tablet TAKE 1 TABLET BY MOUTH ONCE A DAY 30 tablet 3  . eltrombopag (PROMACTA) 50 MG tablet Take 1 tablet (50 mg total) by mouth daily. Take on an empty stomach 1 hour before a meal or 2 hours after 30 tablet 6  . glucose blood test strip 1 each by Other route daily. Fasting each morning 100 each 3  . HYDROcodone-acetaminophen (NORCO) 5-325 MG tablet Take 1 tablet by mouth every 6 (six) hours as needed for moderate pain. 30 tablet 0  . KLOR-CON M20 20 MEQ tablet TAKE 1 TABLET (20 MEQ TOTAL) BY MOUTH DAILY. 30 tablet 11  . LANTUS SOLOSTAR 100 UNIT/ML Solostar Pen INJECT 25 UNITS INTO THE SKIN AT BEDTIME. 15 pen 3  . meclizine (ANTIVERT) 12.5 MG tablet Take 1 tablet (12.5 mg total) by mouth 3 (three) times daily as needed for dizziness. 30 tablet 0  . metoCLOPramide (REGLAN) 5 MG tablet Take 1 tablet (5 mg total) by mouth 3 (three) times daily before meals. (Patient taking differently: Take 5 mg by mouth 3 (three) times daily as needed. ) 90 tablet 0  . metoprolol succinate (TOPROL-XL) 100 MG 24 hr tablet TAKE 1 TABLET DAILY WITH A MEAL OR IMMEDIATELY FOLLOWING A MEAL 90 tablet 3  . quinapril (ACCUPRIL) 20 MG tablet Take 1 tablet (20 mg total) by mouth at bedtime. 90 tablet 4  . tamsulosin (FLOMAX) 0.4 MG CAPS capsule Take 0.4 mg by mouth daily.  0   Current Facility-Administered Medications  Medication Dose Route Frequency Provider Last Rate Last Dose  . cyanocobalamin ((VITAMIN B-12)) injection 1,000 mcg  1,000 mcg Intramuscular Q30 days [name], MD   1,000 mcg at 08/26/15 1432  PHYSICAL EXAMINATION: ECOG PERFORMANCE STATUS: 1 - Symptomatic but completely ambulatory  Filed Vitals:   11/12/15 1134  BP: 141/63  Pulse: 71  Temp: 97.5 F (36.4 C)  Resp: 18   Filed Weights   11/12/15 1134  Weight: 194 lb 11.2 oz (88.315 kg)    GENERAL:alert, no distress and comfortable SKIN: skin color, texture, turgor are normal, no rashes or significant  lesions EYES: normal, Conjunctiva are pink and non-injected, sclera clear Musculoskeletal:no cyanosis of digits and no clubbing  NEURO: alert & oriented x 3 with fluent speech, no focal motor/sensory deficits  LABORATORY DATA:  I have reviewed the data as listed    Component Value Date/Time   NA 139 11/11/2015 1435   NA 138 07/02/2015 0841   K 4.0 11/11/2015 1435   K 4.3 07/02/2015 0841   CL 106 07/02/2015 0841   CL 107 09/27/2012 0940   CO2 26 11/11/2015 1435   CO2 24 07/02/2015 0841   GLUCOSE 142* 11/11/2015 1435   GLUCOSE 150* 07/02/2015 0841   GLUCOSE 89 09/27/2012 0940   BUN 15.7 11/11/2015 1435   BUN 15 07/02/2015 0841   CREATININE 1.4* 11/11/2015 1435   CREATININE 1.34* 07/02/2015 0841   CREATININE 1.64* 09/10/2011 1204   CALCIUM 8.6 11/11/2015 1435   CALCIUM 8.5* 07/02/2015 0841   PROT 8.6* 11/11/2015 1435   PROT 7.1 07/02/2015 0841   ALBUMIN 3.2* 11/11/2015 1435   ALBUMIN 3.5* 07/02/2015 0841   AST 16 11/11/2015 1435   AST 12 07/02/2015 0841   ALT 19 11/11/2015 1435   ALT 10 07/02/2015 0841   ALKPHOS 86 11/11/2015 1435   ALKPHOS 77 07/02/2015 0841   BILITOT 0.33 11/11/2015 1435   BILITOT 0.7 07/02/2015 0841   GFRNONAA 49* 07/02/2015 0841   GFRNONAA 51* 01/19/2011 0605   GFRAA 57* 07/02/2015 0841   GFRAA >60 01/19/2011 0605    No results found for: SPEP, UPEP  Lab Results  Component Value Date   WBC 5.0 11/11/2015   NEUTROABS 2.0 11/11/2015   HGB 11.5* 11/11/2015   HCT 37.7* 11/11/2015   MCV 79.4 11/11/2015   PLT 54* 11/11/2015      Chemistry      Component Value Date/Time   NA 139 11/11/2015 1435   NA 138 07/02/2015 0841   K 4.0 11/11/2015 1435   K 4.3 07/02/2015 0841   CL 106 07/02/2015 0841   CL 107 09/27/2012 0940   CO2 26 11/11/2015 1435   CO2 24 07/02/2015 0841   BUN 15.7 11/11/2015 1435   BUN 15 07/02/2015 0841   CREATININE 1.4* 11/11/2015 1435   CREATININE 1.34* 07/02/2015 0841   CREATININE 1.64* 09/10/2011 1204      Component  Value Date/Time   CALCIUM 8.6 11/11/2015 1435   CALCIUM 8.5* 07/02/2015 0841   ALKPHOS 86 11/11/2015 1435   ALKPHOS 77 07/02/2015 0841   AST 16 11/11/2015 1435   AST 12 07/02/2015 0841   ALT 19 11/11/2015 1435   ALT 10 07/02/2015 0841   BILITOT 0.33 11/11/2015 1435   BILITOT 0.7 07/02/2015 0841       RADIOGRAPHIC STUDIES: I have personally reviewed the radiological images as listed and agreed with the findings in the report. Ct Chest Wo Contrast  11/11/2015  CLINICAL DATA:  Cough. Persistent left pleural effusion. History of lymphoma diagnosed in 2002. Exclude recurrence. Chemotherapy complete. Ex-smoker. EXAM: CT CHEST WITHOUT CONTRAST TECHNIQUE: Multidetector CT imaging of the chest was performed following the standard protocol without IV contrast.  COMPARISON:  11/04/2015 chest radiograph. PET 10/30/2014. Most recent diagnostic CT of 09/27/2012. FINDINGS: Mediastinum/Nodes: No supraclavicular adenopathy. No axillary adenopathy. Aortic and branch vessel atherosclerosis. Tortuous thoracic aorta. Normal heart size, without pericardial effusion. Multivessel coronary artery atherosclerosis. Node within the subcarinal station with extension into the azygoesophageal recess measures 10 mm, not pathologic by size criteria. Increased from 9 mm on the prior. Hilar regions poorly evaluated without intravenous contrast. Periesophageal node measures 7 mm on image 70/series 2 versus 6 mm on the prior. A left retrocrural node measures 1.3 cm on image 70/series 2 versus 1.0 cm on the prior PET. Lungs/Pleura: Moderate left pleural effusion is increased since the prior PET of approximately 1 year ago. No right-sided effusion. Left base dependent atelectasis. Areas of mild smooth interlobular septal thickening, most apparent at the apices. Upper abdomen: Old granulomatous disease in the liver. Normal imaged portions of the spleen, stomach, gallbladder, adrenal glands. Bilateral low-density renal lesions are likely  cysts. Renal vascular calcifications. Cannot exclude concurrent punctate right renal collecting system calculi. Aortic and branch vessel atherosclerosis. Right renal artery stent. 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 No upper abdominal adenopathy. Musculoskeletal: Moderate thoracic spondylosis. IMPRESSION: 1. Increase in moderate left pleural effusion since 10/30/2014 PET. Concurrent mild interlobular septal thickening suggests a component of pulmonary venous congestion. 2. Mild increase in size of thoracic nodes, including adenopathy in the left retrocrural space. Although this could be reactive and related to the pleural fluid and presumed mild pulmonary venous congestion, recurrent indolent lymphoma cannot be excluded. Potential clinical strategies include followup with chest CT at 3-6 months or further evaluation with repeat PET. 3.  Atherosclerosis, including within the coronary arteries. Electronically Signed   By: Abigail Miyamoto M.D.   On: 11/11/2015 17:58     ASSESSMENT & PLAN:  Pleural effusion, left The cause of the pleural effusion is unknown. According to his wife, he has been present for at least 4 months. CT scan is not suspicious for lymphoma. Congestive heart failure, recent aspiration pneumonia or parapneumonic infusion could be possibilities. I recommend consultation with pulmonologist for further evaluation and they agree to proceed.  Thrombocytopenia The patient has chronic pancytopenia related to MDS. His platelet count typically is slightly over 50,000. I would defer to interventional radiologist to consider whether he would need platelet transfusion if thoracentesis is indicated. In most cases, for platelet count over 50,000, the patient should not need transfusion  Pulmonary edema with congestive heart failure [facility]) The patient has chronic cough. CT imaging show mild interstitial edema and the patient has coronary artery disease. He has  appointment pending to see cardiologist. I will forward the progress note to his cardiologist and would defer to them to see if diuretic therapy would be appropriate   Orders Placed This Encounter  Procedures  . Ambulatory referral to Pulmonology    Referral Priority:  Urgent    Referral Type:  Consultation    Referral Reason:  Specialty Services Required    Requested Specialty:  Pulmonary Disease    Number of Visits Requested:  1   All questions were answered. The patient knows to call the clinic with any problems, questions or concerns. No barriers to learning was detected. I spent 15 minutes counseling the patient face to face. The total time spent in the appointment was 20 minutes and more than 50% was on counseling and review of test results     [facility], [place], MD 11/12/2015 12:03 PM

## 2015-11-12 NOTE — Assessment & Plan Note (Addendum)
The cause of the pleural effusion is unknown. According to his wife, he has been present for at least 4 months. CT scan is not suspicious for lymphoma. Congestive heart failure, recent aspiration pneumonia or parapneumonic infusion could be possibilities. I recommend consultation with pulmonologist for further evaluation and they agree to proceed.

## 2015-11-12 NOTE — Assessment & Plan Note (Signed)
The patient has chronic cough. CT imaging show mild interstitial edema and the patient has coronary artery disease. He has appointment pending to see cardiologist. I will forward the progress note to his cardiologist and would defer to them to see if diuretic therapy would be appropriate

## 2015-11-13 ENCOUNTER — Ambulatory Visit (INDEPENDENT_AMBULATORY_CARE_PROVIDER_SITE_OTHER): Payer: Medicare Other | Admitting: Cardiology

## 2015-11-13 ENCOUNTER — Encounter: Payer: Self-pay | Admitting: Cardiology

## 2015-11-13 VITALS — BP 140/64 | HR 76 | Ht 69.0 in | Wt 193.0 lb

## 2015-11-13 DIAGNOSIS — I2510 Atherosclerotic heart disease of native coronary artery without angina pectoris: Secondary | ICD-10-CM

## 2015-11-13 DIAGNOSIS — R0602 Shortness of breath: Secondary | ICD-10-CM | POA: Diagnosis not present

## 2015-11-13 NOTE — Telephone Encounter (Signed)
Closed Encounter  °

## 2015-11-13 NOTE — Patient Instructions (Signed)
Medication Instructions:  Continue current medications  Labwork: BNP  Testing/Procedures: NONE  Follow-Up: Your physician wants you to follow-up in: 6 Months. You will receive a reminder letter in the mail two months in advance. If you don't receive a letter, please call our office to schedule the follow-up appointment.   Any Other Special Instructions Will Be Listed Below (If Applicable).   If you need a refill on your cardiac medications before your next appointment, please call your pharmacy.

## 2015-11-14 ENCOUNTER — Encounter: Payer: Self-pay | Admitting: Internal Medicine

## 2015-11-14 ENCOUNTER — Ambulatory Visit (INDEPENDENT_AMBULATORY_CARE_PROVIDER_SITE_OTHER): Payer: Medicare Other | Admitting: Internal Medicine

## 2015-11-14 VITALS — BP 118/64 | HR 78 | Ht 67.0 in | Wt 193.4 lb

## 2015-11-14 DIAGNOSIS — I10 Essential (primary) hypertension: Secondary | ICD-10-CM | POA: Diagnosis not present

## 2015-11-14 DIAGNOSIS — I2510 Atherosclerotic heart disease of native coronary artery without angina pectoris: Secondary | ICD-10-CM | POA: Diagnosis not present

## 2015-11-14 DIAGNOSIS — J948 Other specified pleural conditions: Secondary | ICD-10-CM

## 2015-11-14 DIAGNOSIS — J90 Pleural effusion, not elsewhere classified: Secondary | ICD-10-CM

## 2015-11-14 LAB — BRAIN NATRIURETIC PEPTIDE: Brain Natriuretic Peptide: 201 pg/mL — ABNORMAL HIGH (ref <100)

## 2015-11-14 MED ORDER — VALSARTAN 160 MG PO TABS: 160.0000 mg | ORAL_TABLET | Freq: Every day | ORAL | Status: DC

## 2015-11-14 NOTE — Assessment & Plan Note (Signed)
first detected 10/30/14  On PET  - See CT chest 11/11/15  In absence of a chest wall injury or unexplained drop in hgb (in pt with low plts) or clinic hx to suggest pna the ddx is between recurrent lymphoma vs chylous effusion related to lymphoma vs other   Discussed in detail all the  indications, usual  risks and alternatives  relative to the benefits with patient who agrees to proceed with  L thoracentesis for usual studies plus trig/ flow cytometry  Total time devoted to counseling  = 35/45m review case with pt/ wife/ daughter discussion of options/alternatives/ personally creating written instructions  in presence of pt  then going over those specific  Instructions directly with the pt including how to use all of the meds but in particular covering each new medication in detail and the difference between the maintenance/automatic meds and the prns using an action plan format for the latter.

## 2015-11-14 NOTE — Patient Instructions (Signed)
Please see patient coordinator before you leave today  to schedule thoracentesis of Your Left side and I will call you with the results  Stop acuupril and start diovan 160 mg daily   Please schedule a follow up office visit in 4  weeks, call sooner if needed with cxr - call in meantime with any breathing problems or worse cough or blood pressure issue.

## 2015-11-14 NOTE — Progress Notes (Signed)
Subjective:    Patient ID: Ricardo Watkins, male    DOB: [birth year],   MRN: [id number]  HPI  14 yowf quit smoking 1958 referred to pulmonary clinic 11/14/2015 by Dr [name] for L pleual effusion first detected 10/30/14      11/14/2015 1st [facility] office visit/ Ricardo Watkins   Chief Complaint  Patient presents with  . Pulmonary Consult    Referred by [name] for eval of pleural effusion. He c/o cough for the past 4-5 months. Cough is esp worse when he lies down and is prod with clear sputum. He has SOB when walking up an incline.   indolent onset progressively severe 24/7 dry coughing fits x 5 months with only mild doe with exertion/ cough worse supine, never productive, no assoc wheeze and no better with inhalers or cough meds  No obvious    day to day or daytime variabilty or assoc excess/ purulent sputum or mucus plugs   cp or chest tightness, subjective wheeze overt sinus or hb symptoms. No unusual exp hx or h/o childhood pna/ asthma or knowledge of premature birth.  Sleeping ok without nocturnal  or early am exacerbation  of respiratory  c/o's or need for noct saba. Also denies any obvious fluctuation of symptoms with weather or environmental changes or other aggravating or alleviating factors except as outlined above   Current Medications, Allergies, Complete Past Medical History, Past Surgical History, Family History, and Social History were reviewed in [employer] record.             Review of Systems  Constitutional: Positive for appetite change and unexpected weight change. Negative for fever, chills and activity change.  HENT: Negative for congestion, dental problem, postnasal drip, rhinorrhea, sneezing, sore throat, trouble swallowing and voice change.   Eyes: Negative for visual disturbance.  Respiratory: Positive for cough and shortness of breath. Negative for choking.   Cardiovascular: Positive for leg swelling. Negative for chest pain.    Gastrointestinal: Positive for abdominal pain. Negative for nausea and vomiting.  Genitourinary: Negative for difficulty urinating.  Musculoskeletal: Negative for arthralgias.  Skin: Negative for rash.  Psychiatric/Behavioral: Negative for behavioral problems and confusion.       Objective:   Physical Exam   amb wm nad   Wt Readings from Last 3 Encounters:  11/14/15 193 lb 6.4 oz (87.726 kg)  11/13/15 193 lb (87.544 kg)  11/12/15 194 lb 11.2 oz (88.315 kg)    Vital signs reviewed    HEENT: nl dentition, turbinates, and oropharynx. Nl external ear canals without cough reflex   NECK :  without JVD/Nodes/TM/ nl carotid upstrokes bilaterally   LUNGS: no acc muscle use,  Nl contour chest with dullness and decreased bs on L one half way up   CV:  RRR  no s3 or murmur or increase in P2, no edema   ABD:  soft and nontender with nl inspiratory excursion in the supine position. No bruits or organomegaly, bowel sounds nl  MS:  Nl gait/ ext warm without deformities, calf tenderness, cyanosis or clubbing No obvious joint restrictions   SKIN: warm and dry without lesions    NEURO:  alert, approp, nl sensorium with  no motor deficits     I personally reviewed images and agree with radiology impression as follows:  CT Chest   No contrast 11/11/15 1. Increase in moderate left pleural effusion since 10/30/2014 PET. Concurrent mild interlobular septal thickening suggests a component of pulmonary venous congestion. 2.  Mild increase in size of thoracic nodes, including adenopathy in the left retrocrural space. Although this could be reactive and related to the pleural fluid and presumed mild pulmonary venous congestion, recurrent indolent lymphoma cannot be excluded.  Lab Results  Component Value Date   WBC 5.0 11/11/2015   HGB 11.5* 11/11/2015   HCT 37.7* 11/11/2015   MCV 79.4 11/11/2015   PLT 54* 11/11/2015     Lab Results  Component Value Date   PLT 54* 11/11/2015    PLT 52* 11/04/2015   PLT 50* 08/08/2015   PLT 42* 03/31/2015   PLT 49* 01/08/2015   PLT 35* 12/25/2014          Assessment & Plan:

## 2015-11-14 NOTE — Assessment & Plan Note (Addendum)
In the best review of chronic cough to date ( NEJM 2016 [id number] [phone]) ,  ACEi are now felt to cause cough in up to  20% of pts which is a 4 fold increase from previous reports and does not include the variety of non-specific complaints we see in pulmonary clinic in pts on ACEi but previously attributed to another dx like  Copd/asthma and  include PNDS, throat and chest congestion, "bronchitis", unexplained dyspnea and noct "strangling" sensations, and hoarseness, but also  atypical /refractory GERD symptoms like dysphagia and "bad heartburn"   The only way I know  to prove this is not an "ACEi Case" is a trial off ACEi x a minimum of 6 weeks then regroup.   Try off accupril and on diovan 160 mg daily x 4 weeks then regroup

## 2015-11-24 ENCOUNTER — Ambulatory Visit (HOSPITAL_COMMUNITY)
Admission: RE | Admit: 2015-11-24 | Discharge: 2015-11-24 | Disposition: A | Discharge status: Home / Self Care | Payer: Medicare Other | Source: Ambulatory Visit | Attending: Radiology | Admitting: Radiology

## 2015-11-24 ENCOUNTER — Ambulatory Visit (HOSPITAL_COMMUNITY)
Admission: RE | Admit: 2015-11-24 | Discharge: 2015-11-24 | Disposition: A | Discharge status: Home / Self Care | Payer: Medicare Other | Source: Ambulatory Visit | Attending: Internal Medicine | Admitting: Internal Medicine

## 2015-11-24 ENCOUNTER — Telehealth: Payer: Self-pay | Admitting: *Deleted

## 2015-11-24 DIAGNOSIS — Z9889 Other specified postprocedural states: Secondary | ICD-10-CM | POA: Diagnosis not present

## 2015-11-24 DIAGNOSIS — R0602 Shortness of breath: Secondary | ICD-10-CM | POA: Insufficient documentation

## 2015-11-24 DIAGNOSIS — D831 Common variable immunodeficiency with predominant immunoregulatory T-cell disorders: Secondary | ICD-10-CM | POA: Insufficient documentation

## 2015-11-24 DIAGNOSIS — Z8572 Personal history of non-Hodgkin lymphomas: Secondary | ICD-10-CM | POA: Insufficient documentation

## 2015-11-24 DIAGNOSIS — R896 Abnormal cytological findings in specimens from other organs, systems and tissues: Secondary | ICD-10-CM | POA: Insufficient documentation

## 2015-11-24 DIAGNOSIS — J90 Pleural effusion, not elsewhere classified: Secondary | ICD-10-CM | POA: Diagnosis not present

## 2015-11-24 DIAGNOSIS — J948 Other specified pleural conditions: Secondary | ICD-10-CM | POA: Insufficient documentation

## 2015-11-24 LAB — GLUCOSE, SEROUS FLUID: GLUCOSE FL: 161 mg/dL

## 2015-11-24 LAB — LACTATE DEHYDROGENASE, PLEURAL OR PERITONEAL FLUID: LD FL: 145 U/L — AB (ref 3–23)

## 2015-11-24 LAB — BODY FLUID CELL COUNT WITH DIFFERENTIAL
LYMPHS FL: 61 %
MONOCYTE-MACROPHAGE-SEROUS FLUID: 39 % — AB (ref 50–90)
Total Nucleated Cell Count, Fluid: 1859 cu mm — ABNORMAL HIGH (ref 0–1000)

## 2015-11-24 LAB — PROTEIN, BODY FLUID: TOTAL PROTEIN, FLUID: 5.4 g/dL

## 2015-11-24 NOTE — Telephone Encounter (Signed)
-----   Message from [name], MD sent at 11/17/2015  8:33 PM EDT ----- BNP was mildly elevated.     However, I would like to see how he does after the thoracentesis that is planned.  No change in therapy at this point.  Call Mr. Hickok with the results and send results to [place], MD

## 2015-11-24 NOTE — Procedures (Signed)
Ultrasound-guided diagnostic and therapeutic left thoracentesis performed yielding 1.6 liters of hazy, amber  fluid. No immediate complications. Follow-up chest x-ray pending. The fluid was sent to the lab for preordered studies.

## 2015-11-24 NOTE — Telephone Encounter (Signed)
Spoke with pt about his blood work , pt had thoracentesis done this morning, he stated that a little over a liter of fluids was drawn from his lung, pt stated he's feeling a lot better.

## 2015-11-25 ENCOUNTER — Telehealth: Payer: Self-pay | Admitting: *Deleted

## 2015-11-25 ENCOUNTER — Encounter: Payer: Self-pay | Admitting: Pharmacist

## 2015-11-25 LAB — TRIGLYCERIDES, BODY FLUIDS: Triglycerides, Fluid: 22 mg/dL

## 2015-11-25 NOTE — Progress Notes (Signed)
Oral Chemotherapy Follow-Up Form  Original Start date of oral chemotherapy: 04/02/15  Called patient today to follow up regarding patient's oral chemotherapy medication: Promacta  Pt reports 0 tablets/doses missed recently.  He takes his Promacta at 11 am daily (1 hour before he eats lunch).  Pt reports the following side effects:  Diarrhea- [name] aware.  Pt takes Imodium and it helps some. Cough- followed by Cardiology & Pulmonology.  Pt had thoracentesis yesterday.  He was taken off Accupril and changed to Diovan 160 mg/day x 4 weeks test period (approx 11/14/15 - 12/12/15) to determine if being off ACE-I will relieve the cough.  Pt stated today over phone that he feels the same re: cough.  The frequency and degree have not changed.  No detected improvement by pt.  "It's still there.  Not a lot of change."   There is a potential drug interaction between Promacta and Diovan.  Promacta (eltrombopag) may increase the serum conc of OATP1B1/SLCO1B1 substrates (valsartan).  Lexicomp advises monitor for adverse effects related to the valsartan.  A dose reduction of valsartan may be necessary if adverse effects occur.   Pt stated he does not check his BP at home.  He checks it occasionally.  He has had no dizziness but has ongoing lightheadedness if he goes from sitting/lying to standing position too quickly.  This is not a new problem.  He knows to move slowly when standing.  I have forwarded this info to pulmonology & cardiology.  Other Issues: Pt states he received a phone call from [employer] stating that our office needed to complete paperwork and fax back to [employer] but he wasn't sure what type of form needed to be filled out.  I s/w Cameo, RN for [name] and she is aware and has been in contact w/ [employer].  [name] in finance office is also aware.  Cameo will keep pt aware of any changes/updates.  This is relating to an application for financial assistance through the  [place].  We will d/c follow up phone calls to pt.  He has our phone number to the oral chemo clinic and [employer] also contacts pt by phone.  Pt agrees w/ this plan.  Thank you, Kennith Center, Pharm.D., CPP 11/25/2015@10 :16 AM Oral Chemotherapy Clinic

## 2015-11-25 NOTE — Telephone Encounter (Signed)
I s/w Clarisse at [employer] at 10 am in response to VM I received from one of pt's caregivers.   Caregiver left VM stating pt needed a form completed by physician in order to obtain his Doxazosin (I assumed they meant Promacta). Clarisse at [employer] said [employer] has run out of money so  they want pt to apply to [employer] for co-pay assistance.  She says they have faxed over a form for physician to complete/sign.  Informed her we have not received this form in our office and requested they fax a new one to my desk. She said it will be faxed within one hour.  I have not received the form yet, so I called [employer] again at 2:25 pm. S/w Jasmine and informed her we have not received fax yet.  She says she cannot see that it has been faxed to [place] yet.  She will request the form to be faxed.

## 2015-11-26 ENCOUNTER — Ambulatory Visit (INDEPENDENT_AMBULATORY_CARE_PROVIDER_SITE_OTHER): Payer: Medicare Other | Admitting: Podiatry

## 2015-11-26 ENCOUNTER — Encounter: Payer: Self-pay | Admitting: Podiatry

## 2015-11-26 ENCOUNTER — Telehealth: Payer: Self-pay | Admitting: *Deleted

## 2015-11-26 DIAGNOSIS — M79676 Pain in unspecified toe(s): Secondary | ICD-10-CM

## 2015-11-26 DIAGNOSIS — B351 Tinea unguium: Secondary | ICD-10-CM

## 2015-11-26 NOTE — Progress Notes (Signed)
Quick Note:  Spoke with pt and notified of results per Dr. [name]. Pt verbalized understanding and denied any questions.  ______ 

## 2015-11-26 NOTE — Progress Notes (Signed)
Patient ID: Ricardo Watkins, male   DOB: 1931-03-31, 82 y.o.   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 Removed Band-Aid 1-3 days and apply topical antibiotic ointment daily until a scab forms  Reappoint 3 months

## 2015-11-26 NOTE — Patient Instructions (Signed)
Remove the Band-Aid on the fifth left toe in 1-3 days and apply topical antibiotic ointment and Band-Aid daily until a scab forms  Diabetes and Foot Care Diabetes may cause you to have problems because of poor blood supply (circulation) to your feet and legs. This may cause the skin on your feet to become thinner, break easier, and heal more slowly. Your skin may become dry, and the skin may peel and crack. You may also have nerve damage in your legs and feet causing decreased feeling in them. You may not notice minor injuries to your feet that could lead to infections or more serious problems. Taking care of your feet is one of the most important things you can do for yourself.  HOME CARE INSTRUCTIONS  Wear shoes at all times, even in the house. Do not go barefoot. Bare feet are easily injured.  Check your feet daily for blisters, cuts, and redness. If you cannot see the bottom of your feet, use a mirror or ask someone for help.  Wash your feet with warm water (do not use hot water) and mild soap. Then pat your feet and the areas between your toes until they are completely dry. Do not soak your feet as this can dry your skin.  Apply a moisturizing lotion or petroleum jelly (that does not contain alcohol and is unscented) to the skin on your feet and to dry, brittle toenails. Do not apply lotion between your toes.  Trim your toenails straight across. Do not dig under them or around the cuticle. File the edges of your nails with an emery board or nail file.  Do not cut corns or calluses or try to remove them with medicine.  Wear clean socks or stockings every day. Make sure they are not too tight. Do not wear knee-high stockings since they may decrease blood flow to your legs.  Wear shoes that fit properly and have enough cushioning. To break in new shoes, wear them for just a few hours a day. This prevents you from injuring your feet. Always look in your shoes before you put them on to be sure  there are no objects inside.  Do not cross your legs. This may decrease the blood flow to your feet.  If you find a minor scrape, cut, or break in the skin on your feet, keep it and the skin around it clean and dry. These areas may be cleansed with mild soap and water. Do not cleanse the area with peroxide, alcohol, or iodine.  When you remove an adhesive bandage, be sure not to damage the skin around it.  If you have a wound, look at it several times a day to make sure it is healing.  Do not use heating pads or hot water bottles. They may burn your skin. If you have lost feeling in your feet or legs, you may not know it is happening until it is too late.  Make sure your health care provider performs a complete foot exam at least annually or more often if you have foot problems. Report any cuts, sores, or bruises to your health care provider immediately. SEEK MEDICAL CARE IF:   You have an injury that is not healing.  You have cuts or breaks in the skin.  You have an ingrown nail.  You notice redness on your legs or feet.  You feel burning or tingling in your legs or feet.  You have pain or cramps in your legs and feet.  Your legs or feet are numb.  Your feet always feel cold. SEEK IMMEDIATE MEDICAL CARE IF:   There is increasing redness, swelling, or pain in or around a wound.  There is a red line that goes up your leg.  Pus is coming from a wound.  You develop a fever or as directed by your health care provider.  You notice a bad smell coming from an ulcer or wound.   This information is not intended to replace advice given to you by your health care provider. Make sure you discuss any questions you have with your health care provider.   Document Released: 06/04/2000 Document Revised: 02/07/2013 Document Reviewed: 11/14/2012 Elsevier Interactive Patient Education [employer].

## 2015-11-26 NOTE — Telephone Encounter (Signed)
LLS form for co-pay assistance signed by [name].  I faxed back to [place] at [facility] to fax [phone] and also to Fax [phone].   Notified pt of form faxed and instructed pt to f/u w/ [facility].  Informed pt he may miss a few days of Promacta as it is unlikely that LLS will be able to process this request in time to get refill by tomorrow.  Pt verbalized understanding.

## 2015-12-01 ENCOUNTER — Ambulatory Visit (INDEPENDENT_AMBULATORY_CARE_PROVIDER_SITE_OTHER): Payer: Medicare Other | Admitting: *Deleted

## 2015-12-01 DIAGNOSIS — D519 Vitamin B12 deficiency anemia, unspecified: Secondary | ICD-10-CM

## 2015-12-01 MED ORDER — CYANOCOBALAMIN 1000 MCG/ML IJ SOLN
1000.0000 ug | Freq: Once | INTRAMUSCULAR | Status: AC
  Administered 2015-12-01: 1000 ug via INTRAMUSCULAR

## 2015-12-01 NOTE — Progress Notes (Signed)
Patient ID: Ricardo Watkins, male   DOB: 03/12/1931, 82 y.o.   MRN: [id number] Patient seen in office for Vitamin B 12 injection.   Tolerated IM administration well.   

## 2015-12-11 ENCOUNTER — Other Ambulatory Visit: Payer: Self-pay | Admitting: Family Medicine

## 2015-12-11 NOTE — Telephone Encounter (Signed)
Refill appropriate and filled per protocol. 

## 2015-12-12 ENCOUNTER — Ambulatory Visit (INDEPENDENT_AMBULATORY_CARE_PROVIDER_SITE_OTHER): Payer: Medicare Other | Admitting: Internal Medicine

## 2015-12-12 ENCOUNTER — Encounter: Payer: Self-pay | Admitting: Internal Medicine

## 2015-12-12 ENCOUNTER — Ambulatory Visit (INDEPENDENT_AMBULATORY_CARE_PROVIDER_SITE_OTHER)
Admission: RE | Admit: 2015-12-12 | Discharge: 2015-12-12 | Disposition: A | Discharge status: Home / Self Care | Payer: Medicare Other | Source: Ambulatory Visit | Attending: Internal Medicine | Admitting: Internal Medicine

## 2015-12-12 VITALS — BP 124/66 | HR 66 | Ht 66.0 in | Wt 191.0 lb

## 2015-12-12 DIAGNOSIS — I10 Essential (primary) hypertension: Secondary | ICD-10-CM | POA: Diagnosis not present

## 2015-12-12 DIAGNOSIS — R0602 Shortness of breath: Secondary | ICD-10-CM | POA: Diagnosis not present

## 2015-12-12 DIAGNOSIS — J948 Other specified pleural conditions: Secondary | ICD-10-CM

## 2015-12-12 DIAGNOSIS — R05 Cough: Secondary | ICD-10-CM | POA: Diagnosis not present

## 2015-12-12 DIAGNOSIS — J90 Pleural effusion, not elsewhere classified: Secondary | ICD-10-CM

## 2015-12-12 DIAGNOSIS — I2510 Atherosclerotic heart disease of native coronary artery without angina pectoris: Secondary | ICD-10-CM | POA: Diagnosis not present

## 2015-12-12 NOTE — Progress Notes (Signed)
Subjective:    Patient ID: Ricardo Watkins, male    DOB: Nov 02, 1930,   MRN: [id number]    Brief patient profile:  36 yowm  With remote follicular lymphoma/ now  MDS quit smoking 1960 referred to pulmonary clinic 11/14/2015 by Ricardo Watkins for L pleual effusion first detected 10/30/14     History of Present Illness  11/14/2015 1st [facility] office visit/ Ricardo Watkins   Chief Complaint  Patient presents with  . Pulmonary Consult    Referred by Ricardo. Alvy Watkins for eval of pleural effusion. He c/o cough for the past 4-5 months. Cough is esp worse when he lies down and is prod with clear sputum. He has SOB when walking up an incline.   indolent onset progressively severe 24/7 dry coughing fits x 5 months with only mild doe with exertion/ cough worse supine, never productive, no assoc wheeze and no better with  otc cough meds rec Please see patient coordinator before you leave today  to schedule thoracentesis of Your Left side and I will call you with the results Stop acuupril and start diovan 160 mg daily  Please schedule a follow up office visit in 4  weeks, call sooner if needed with cxr - call in meantime with any breathing problems or worse cough or blood pressure issue.   - L thoracentesis 11/24/2015 > 1.6 liters of hazy, amber fluid >>   Wbc 1859 L/M > P and exudative by Prot, nl glucose/ cyt>> neg/ predominently T cells, no evidence of lymphoma or chylous effusion      12/12/2015  f/u ov/Ricardo Watkins re:  L exudative effusion  Chief Complaint  Patient presents with  . Follow-up    SOB and cough are unchanged. No new co's today.   cough some better daytime but not hs No change at all with L tap in terms of cough or sob despite almost complete evacuation of effusion  No obvious day to day or daytime variability or assoc excess/ purulent sputum or mucus plugs or hemoptysis or cp or chest tightness, subjective wheeze or overt sinus or hb symptoms. No unusual exp hx or h/o childhood pna/ asthma or  knowledge of premature birth.  Sleeping ok without nocturnal  or early am exacerbation  of respiratory  c/o's or need for noct saba. Also denies any obvious fluctuation of symptoms with weather or environmental changes or other aggravating or alleviating factors except as outlined above   Current Medications, Allergies, Complete Past Medical History, Past Surgical History, Family History, and Social History were reviewed in [employer] record.  ROS  The following are not active complaints unless bolded sore throat, dysphagia, dental problems, itching, sneezing,  nasal congestion or excess/ purulent secretions, ear ache,   fever, chills, sweats, unintended wt loss, classically pleuritic or exertional cp,  orthopnea pnd or leg swelling, presyncope, palpitations, abdominal pain, anorexia, nausea, vomiting, diarrhea  or change in bowel or bladder habits, change in stools or urine, dysuria,hematuria,  rash, arthralgias, visual complaints, headache, numbness, weakness or ataxia or problems with walking or coordination,  change in mood/affect or memory.             Objective:   Physical Exam   amb wm nad    12/12/2015       191   11/14/15 193 lb 6.4 oz (87.726 kg)  11/13/15 193 lb (87.544 kg)  11/12/15 194 lb 11.2 oz (88.315 kg)    Vital signs reviewed    HEENT: nl  dentition, turbinates, and oropharynx. Nl external ear canals without cough reflex   NECK :  without JVD/Nodes/TM/ nl carotid upstrokes bilaterally   LUNGS: no acc muscle use,  Nl contour chest with dullness and decreased bs on L bottom third   CV:  RRR  no s3 or murmur or increase in P2, no edema   ABD:  soft and nontender with nl inspiratory excursion in the supine position. No bruits or organomegaly, bowel sounds nl  MS:  Nl gait/ ext warm without deformities, calf tenderness, cyanosis or clubbing No obvious joint restrictions   SKIN: warm and dry without lesions    NEURO:  alert, approp, nl  sensorium with  no motor deficits       CXR PA and Lateral:   12/12/2015 :    I personally reviewed images and agree with radiology impression as follows:    Increased size of small to moderate left pleural effusion and left lower lobe atelectasis. My impression:  L Effusion is back to previous to thoracentesis baseline    Labs ordered/ reviewed:      Chemistry      Component Value Date/Time   NA 139 11/11/2015 1435   NA 138 07/02/2015 0841   K 4.0 11/11/2015 1435   K 4.3 07/02/2015 0841   CL 106 07/02/2015 0841   CL 107 09/27/2012 0940   CO2 26 11/11/2015 1435   CO2 24 07/02/2015 0841   BUN 15.7 11/11/2015 1435   BUN 15 07/02/2015 0841   CREATININE 1.4* 11/11/2015 1435   CREATININE 1.34* 07/02/2015 0841   CREATININE 1.64* 09/10/2011 1204      Component Value Date/Time   CALCIUM 8.6 11/11/2015 1435   CALCIUM 8.5* 07/02/2015 0841   ALKPHOS 86 11/11/2015 1435   ALKPHOS 77 07/02/2015 0841   AST 16 11/11/2015 1435   AST 12 07/02/2015 0841   ALT 19 11/11/2015 1435   ALT 10 07/02/2015 0841   BILITOT 0.33 11/11/2015 1435   BILITOT 0.7 07/02/2015 0841     Albumin     3.2                                   11/11/15       Lab Results  Component Value Date   WBC 5.0 11/11/2015   HGB 11.5* 11/11/2015   HCT 37.7* 11/11/2015   MCV 79.4 11/11/2015   PLT 54* 11/11/2015             Assessment & Plan:

## 2015-12-12 NOTE — Patient Instructions (Addendum)
Please remember to go to the  x-ray department downstairs for your tests - we will call you with the results when they are available.  Try propping up as much as you can when sleeping  Please schedule a follow up office visit in 6 weeks, call sooner if needed   add:   Needs repeat labs including Quant Gold, ESR on f/u

## 2015-12-14 NOTE — Assessment & Plan Note (Signed)
Change acei to ARB 11/14/2015 due to cough  > min better 12/12/2015   Adequate control on present rx, reviewed > no change in rx needed    NB  Although even in retrospect it may not be clear the ACEi contributed to the pt's symptoms,  Pt improved off them and adding them back at this point or in the future would risk confusion in interpretation of non-specific respiratory symptoms to which this patient is prone  ie  Better not to muddy the waters here.

## 2015-12-14 NOTE — Assessment & Plan Note (Signed)
first detected 10/30/14  On PET  - See CT chest 11/11/15 - L thoracentesis 11/24/2015 > 1.6 liters of hazy, amber fluid >>   Wbc 1859 L/M > P and exudative by Prot, nl glucose/ cyt>> neg/ predominently T cells, no evidence of lymphoma or chylous effusion   - Re check cxr 12/12/2015 > back to baseline L effusion   I had an extended discussion with the patient/fm  reviewing all relevant studies completed to date and  lasting 15 to 20 minutes of a 25 minute visit on the following ongoing concerns:   This is clearly a very chronic/ exudative process ? Etiology with no evidence lymphoma though have not excluded other malignancies and ? How intact his lymphatics are (effusion from any source would have a problem clearing if lymphatics are dysfunctional).  He did not apparently perceive benefit from prev T centesis so no need to repeat until / unless directed by symptoms  Ultimately will likely need vats or pleurodesis and based on overall geriatric decline/ MDS would favor the latter > either way would need to be deferred to T surg but not needed for now  Each maintenance medication was reviewed in detail including most importantly the difference between maintenance and as needed and under what circumstances the prns are to be used.  Please see instructions for details which were reviewed in writing and the patient given a copy.

## 2015-12-15 NOTE — Progress Notes (Signed)
Quick Note:  LMTCB ______ 

## 2015-12-16 NOTE — Progress Notes (Signed)
Quick Note:  Spoke with pt and notified of results per Dr. [name]. Pt verbalized understanding and denied any questions.  ______ 

## 2015-12-29 ENCOUNTER — Ambulatory Visit (INDEPENDENT_AMBULATORY_CARE_PROVIDER_SITE_OTHER): Payer: Medicare Other | Admitting: Family Medicine

## 2015-12-29 ENCOUNTER — Encounter: Payer: Self-pay | Admitting: Family Medicine

## 2015-12-29 VITALS — BP 108/80 | HR 68 | Temp 97.5°F | Resp 18 | Ht 69.0 in | Wt 190.0 lb

## 2015-12-29 DIAGNOSIS — Z794 Long term (current) use of insulin: Secondary | ICD-10-CM

## 2015-12-29 DIAGNOSIS — I10 Essential (primary) hypertension: Secondary | ICD-10-CM | POA: Diagnosis not present

## 2015-12-29 DIAGNOSIS — E119 Type 2 diabetes mellitus without complications: Secondary | ICD-10-CM

## 2015-12-29 DIAGNOSIS — D46C Myelodysplastic syndrome with isolated del(5q) chromosomal abnormality: Secondary | ICD-10-CM | POA: Diagnosis not present

## 2015-12-29 DIAGNOSIS — E785 Hyperlipidemia, unspecified: Secondary | ICD-10-CM

## 2015-12-29 DIAGNOSIS — I2510 Atherosclerotic heart disease of native coronary artery without angina pectoris: Secondary | ICD-10-CM

## 2015-12-29 DIAGNOSIS — D519 Vitamin B12 deficiency anemia, unspecified: Secondary | ICD-10-CM | POA: Diagnosis not present

## 2015-12-29 LAB — HEMOGLOBIN A1C
Hgb A1c MFr Bld: 6.7 % — ABNORMAL HIGH (ref <5.7)
Mean Plasma Glucose: 146 mg/dL

## 2015-12-29 NOTE — Progress Notes (Signed)
Subjective:    Patient ID: Ricardo Watkins, male    DOB: 1930/08/17, 82 y.o.   MRN: [id number]  HPI Since the last time I saw the patient, he was diagnosed with a left-sided pleural effusion and underwent thoracentesis.  Pleural effusion had reaccumulated by the end of June. Fortunately fluid showed no evidence of cancer. Pulmonology, Dr. Melvyn Novas, is following the patient. Next step would be VATS/pleurodesis.  However this is obviously high risk in this individual given his age and mild dysplastic syndrome and would be a last step option. On examination today, the patient has diminished breath sounds in the left base to the mid lung field. However he denies any dyspnea on exertion. His cough that he was having has essentially resolved. He denies any chest pain or shortness of breath. Therefore there are no clinical indications for further treatment. Conservative monitoring may be an option. He reports fasting blood sugars and random blood sugars between 101 150. He denies any hypoglycemic episodes. His blood pressure today is well controlled at 108/80. Unfortunately he has been taking Flomax and doxazosin together. However he has not been having any dizzy spells. Past Medical History  Diagnosis Date  . PVD (peripheral vascular disease) ([place])     rt renal artery stent  . CAD (coronary artery disease)     30% LAD Stenosis, 70% ramus intermedius stenosis, treated with PTCA and angioplasty by Dr Albertine Patricia 2004  . DM (diabetes mellitus) ([place])   . Dyslipidemia   . Renal insufficiency   . Thrombocytopenia ([place])   . Hypertension   . Vitamin D deficiency   . Inguinal hernia     right  . Adenomatous colon polyp   . Cataract     right eye  . Hyperlipidemia   . Thrombotic stroke ([place]) 09/10/2011    January 18, 2011 infarct genu & post limb R internal capsule - acute; previous lacunar infarcts/extensive white matter dis  . DVT (deep venous thrombosis) ([place])     secondary to surgery  . Macrocytic anemia  03/20/2013    Suspect chemo related MDS  . 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 Deficiency anemia 04/19/2014  . Diverticulosis   . Microcytic anemia 07/07/2015  . Cough, persistent 11/04/2015  . Pleural effusion, left 11/04/2015  . Non Hodgkin's lymphoma ([place])   . Prostate cancer ([place])     seed implants no recurrence  . Prostate CA ([place]) 09/10/2011    Gleason 3+3 R, 3+4 L lobe May 2007 Rx Radioactive seed implants Dr [name]   Past Surgical History  Procedure Laterality Date  . Cystourethroscopy      ROBOTIC ARM NUCLETRON SEED IMPLANTATION OF PROSTATE  . Exploratory laparotomy      For evaluation of lymphoma  . Colonoscopy    . Appendectomy      patient ?   Current Outpatient Prescriptions on File Prior to Visit  Medication Sig Dispense Refill  . acetaminophen (TYLENOL) 500 MG tablet Take 500 mg by mouth every 6 (six) hours as needed for pain.    Marland Kitchen clopidogrel (PLAVIX) 75 MG tablet TAKE 1 TABLET BY MOUTH DAILY WITH BREAKFAST. 90 tablet 4  . doxazosin (CARDURA) 4 MG tablet TAKE 1 TABLET BY MOUTH ONCE A DAY 30 tablet 3  . eltrombopag (PROMACTA) 50 MG tablet Take 1 tablet (50 mg total) by mouth daily. Take on an empty stomach 1 hour before a meal or 2 hours after 30 tablet 6  .  glucose blood test strip 1 each by Other route daily. Fasting each morning 100 each 3  . HYDROcodone-acetaminophen (NORCO) 5-325 MG tablet Take 1 tablet by mouth every 6 (six) hours as needed for moderate pain. 30 tablet 0  . IRON PO Take 1 tablet by mouth daily.    Marland Kitchen KLOR-CON M20 20 MEQ tablet TAKE 1 TABLET (20 MEQ TOTAL) BY MOUTH DAILY. 30 tablet 11  . LANTUS SOLOSTAR 100 UNIT/ML Solostar Pen INJECT 25 UNITS INTO THE SKIN AT BEDTIME. 15 pen 3  . meclizine (ANTIVERT) 12.5 MG tablet Take 1 tablet (12.5 mg total) by mouth 3 (three) times daily as needed for dizziness. 30 tablet 0  . metoCLOPramide (REGLAN) 5 MG tablet Take 1 tablet (5 mg total) by mouth 3 (three) times daily before meals. (Patient  taking differently: Take 5 mg by mouth 3 (three) times daily as needed. ) 90 tablet 0  . metoprolol succinate (TOPROL-XL) 100 MG 24 hr tablet TAKE 1 TABLET BY MOUTH ONCE A DAY 90 tablet 3  . Multiple Vitamins-Minerals (MULTIVITAMIN ADULT PO) Take 1 tablet by mouth daily.    . valsartan (DIOVAN) 160 MG tablet Take 1 tablet (160 mg total) by mouth daily. 30 tablet 11   Current Facility-Administered Medications on File Prior to Visit  Medication Dose Route Frequency Provider Last Rate Last Dose  . cyanocobalamin ((VITAMIN B-12)) injection 1,000 mcg  1,000 mcg Intramuscular Q30 days [name], MD   1,000 mcg at 12/29/15 0940   Allergies  Allergen Reactions  . Glucophage [Metformin Hydrochloride] Other (See Comments)    Chest pain  . Zetia [Ezetimibe] Other (See Comments)    weakness  . Fenofibrate Rash  . Niacin-Lovastatin Er Rash   Social History   Social History  . Marital Status: Married    Spouse Name: N/A  . Number of Children: 4  . Years of Education: N/A   Occupational History  . retired    Social History Main Topics  . Smoking status: Former Smoker -- 0.50 packs/day for 20 years    Types: Cigarettes    Quit date: 06/21/1958  . Smokeless tobacco: Never Used  . Alcohol Use: No  . Drug Use: No  . Sexual Activity:    Partners: Female   Other Topics Concern  . Not on file   Social History Narrative      Review of Systems  All other systems reviewed and are negative.      Objective:   Physical Exam  Constitutional: He appears well-developed and well-nourished.  Neck: Neck supple. No JVD present.  Cardiovascular: Normal rate and regular rhythm.   Murmur heard. Pulmonary/Chest: Effort normal. He has decreased breath sounds in the left middle field and the left lower field. He has no wheezes. He has no rhonchi. He has no rales.  Abdominal: Soft. Bowel sounds are normal.  Musculoskeletal: He exhibits no edema.  Vitals reviewed.         Assessment &  Plan:  Controlled type 2 diabetes mellitus without complication, with long-term current use of insulin (HCC) - Plan: Hemoglobin A1c  Anemia, B12 deficiency  Benign essential HTN  ASCVD (arteriosclerotic cardiovascular disease)  HLD (hyperlipidemia)  MDS (myelodysplastic syndrome) with 5q deletion (HCC)  Blood pressure is stable. From a standpoint of his diabetes, the patient is doing extremely well CMP was just checked in May and chronic kidney disease with stable with a creatinine of 1.4. Oncology has been following his blood counts to his myelodysplastic syndrome  and these are stable although his platelet counts are hovering in the 50s. I will check a hemoglobin A1c today. As long as his hemoglobin A1c is less than 7.5 I'll make no changes in his insulin dose.Marland Kitchen

## 2016-01-26 ENCOUNTER — Telehealth: Payer: Self-pay | Admitting: Pharmacist

## 2016-01-26 NOTE — Telephone Encounter (Signed)
Oral Chemotherapy Pharmacist Encounter   Received call from [employer] (patient's son) stating he was returning a call from the oral chemo clinic number [phone]), not sure about the subject. I did not call patient, s/w Kennith Center, PharmD, and Cameo, RN. Neither of them called patient either. Son noted that patient has 6 pills left. I encouraged patient to call [facility] to order refill of medication.  Johny Drilling, PharmD, BCPS Oral Chemotherapy Clinic

## 2016-01-27 ENCOUNTER — Other Ambulatory Visit (INDEPENDENT_AMBULATORY_CARE_PROVIDER_SITE_OTHER): Payer: Medicare Other

## 2016-01-27 ENCOUNTER — Encounter: Payer: Self-pay | Admitting: Internal Medicine

## 2016-01-27 ENCOUNTER — Ambulatory Visit (INDEPENDENT_AMBULATORY_CARE_PROVIDER_SITE_OTHER): Payer: Medicare Other | Admitting: Internal Medicine

## 2016-01-27 ENCOUNTER — Ambulatory Visit (INDEPENDENT_AMBULATORY_CARE_PROVIDER_SITE_OTHER)
Admission: RE | Admit: 2016-01-27 | Discharge: 2016-01-27 | Disposition: A | Discharge status: Home / Self Care | Payer: Medicare Other | Source: Ambulatory Visit | Attending: Internal Medicine | Admitting: Internal Medicine

## 2016-01-27 ENCOUNTER — Telehealth: Payer: Self-pay | Admitting: *Deleted

## 2016-01-27 VITALS — BP 136/70 | HR 64 | Ht 68.0 in | Wt 194.0 lb

## 2016-01-27 DIAGNOSIS — J90 Pleural effusion, not elsewhere classified: Secondary | ICD-10-CM

## 2016-01-27 DIAGNOSIS — R05 Cough: Secondary | ICD-10-CM | POA: Diagnosis not present

## 2016-01-27 DIAGNOSIS — D61818 Other pancytopenia: Secondary | ICD-10-CM

## 2016-01-27 DIAGNOSIS — R0609 Other forms of dyspnea: Secondary | ICD-10-CM

## 2016-01-27 DIAGNOSIS — R0600 Dyspnea, unspecified: Secondary | ICD-10-CM

## 2016-01-27 DIAGNOSIS — I2510 Atherosclerotic heart disease of native coronary artery without angina pectoris: Secondary | ICD-10-CM | POA: Diagnosis not present

## 2016-01-27 DIAGNOSIS — J948 Other specified pleural conditions: Secondary | ICD-10-CM | POA: Diagnosis not present

## 2016-01-27 DIAGNOSIS — R0602 Shortness of breath: Secondary | ICD-10-CM | POA: Diagnosis not present

## 2016-01-27 LAB — SEDIMENTATION RATE: Sed Rate: 19 mm/hr (ref 0–20)

## 2016-01-27 LAB — CBC WITH DIFFERENTIAL/PLATELET
BASOS PCT: 0.2 % (ref 0.0–3.0)
Basophils Absolute: 0 10*3/uL (ref 0.0–0.1)
EOS ABS: 0 10*3/uL (ref 0.0–0.7)
Eosinophils Relative: 0.6 % (ref 0.0–5.0)
HEMATOCRIT: 34.1 % — AB (ref 39.0–52.0)
Hemoglobin: 10.8 g/dL — ABNORMAL LOW (ref 13.0–17.0)
LYMPHS ABS: 2.3 10*3/uL (ref 0.7–4.0)
LYMPHS PCT: 51 % — AB (ref 12.0–46.0)
MCHC: 31.6 g/dL (ref 30.0–36.0)
MCV: 76.6 fl — AB (ref 78.0–100.0)
Monocytes Absolute: 0.3 10*3/uL (ref 0.1–1.0)
Monocytes Relative: 7.8 % (ref 3.0–12.0)
NEUTROS ABS: 1.8 10*3/uL (ref 1.4–7.7)
Neutrophils Relative %: 40.4 % — ABNORMAL LOW (ref 43.0–77.0)
RBC: 4.45 Mil/uL (ref 4.22–5.81)
RDW: 26 % — ABNORMAL HIGH (ref 11.5–15.5)
WBC: 4.5 10*3/uL (ref 4.0–10.5)

## 2016-01-27 LAB — BASIC METABOLIC PANEL
BUN: 16 mg/dL (ref 6–23)
CALCIUM: 8.8 mg/dL (ref 8.4–10.5)
CO2: 29 meq/L (ref 19–32)
CREATININE: 1.46 mg/dL (ref 0.40–1.50)
Chloride: 105 mEq/L (ref 96–112)
GFR: 48.95 mL/min — ABNORMAL LOW (ref >60.00)
GLUCOSE: 100 mg/dL — AB (ref 70–99)
Potassium: 4.7 mEq/L (ref 3.5–5.1)
SODIUM: 138 meq/L (ref 135–145)

## 2016-01-27 LAB — BRAIN NATRIURETIC PEPTIDE: Pro B Natriuretic peptide (BNP): 311 pg/mL — ABNORMAL HIGH (ref 0.0–100.0)

## 2016-01-27 LAB — HEPATIC FUNCTION PANEL
ALK PHOS: 65 U/L (ref 39–117)
ALT: 17 U/L (ref 0–53)
AST: 18 U/L (ref 0–37)
Albumin: 3.7 g/dL (ref 3.5–5.2)
BILIRUBIN DIRECT: 0.1 mg/dL (ref 0.0–0.3)
Total Bilirubin: 0.5 mg/dL (ref 0.2–1.2)
Total Protein: 8.1 g/dL (ref 6.0–8.3)

## 2016-01-27 NOTE — Telephone Encounter (Signed)
Aware- see result note  

## 2016-01-27 NOTE — Telephone Encounter (Signed)
Lab called and said pt had a platelet count of 51.  Please advise MW thanks

## 2016-01-27 NOTE — Assessment & Plan Note (Addendum)
first detected 10/30/14  On PET  - See CT chest 11/11/15 - L thoracentesis 11/24/2015 > 1.6 liters of hazy, amber fluid >>   Wbc 1859 L/M > P and exudative by Prot, nl glucose/ cyt>> neg/ predominently T cells, no evidence of lymphoma or chylous effusion   - Re check cxr 12/12/2015 > back to baseline L effusion  - 01/27/2016 worse again > rec repeat T centesis and then T surg eval for ?VATS?  I had an extended discussion with the patient/fm reviewing all relevant studies completed to date and  lasting 15 to 20 minutes of a 25 minute visit on the following ongoing concerns:   Discussed in detail all the  indications, usual  risks and alternatives  relative to the benefits with patient/fm  who agree  to proceed with repeat t centesis and unless dx this time then go ahead with T surgery eval next   Advised to hold plavix until know plt count and advise how many days to hold periprocedure.

## 2016-01-27 NOTE — Patient Instructions (Addendum)
Please remember to go to the lab/xray department downstairs for your tests - we will call you with the results when they are available.  Please see patient coordinator before you leave today  to schedule thoracentesis this week - hold your plavix in the meantime until you hear from [place]

## 2016-01-27 NOTE — Progress Notes (Signed)
Subjective:    Patient ID: Ricardo Watkins, male    DOB: 12-23-1930,   MRN: [id number]    Brief patient profile:  70 yowm  With remote follicular lymphoma/ now  MDS quit smoking 1960 referred to pulmonary clinic 11/14/2015 by Dr [name] for L pleual effusion first detected 10/30/14     History of Present Illness  11/14/2015 1st [facility] office visit/ Galileo Colello   Chief Complaint  Patient presents with  . Pulmonary Consult    Referred by [name] for eval of pleural effusion. He c/o cough for the past 4-5 months. Cough is esp worse when he lies down and is prod with clear sputum. He has SOB when walking up an incline.   indolent onset progressively severe 24/7 dry coughing fits x 5 months with only mild doe with exertion/ cough worse supine, never productive, no assoc wheeze and no better with  otc cough meds rec Please see patient coordinator before you leave today  to schedule thoracentesis of Your Left side and I will call you with the results Stop acuupril and start diovan 160 mg daily    - L thoracentesis 11/24/2015 > 1.6 liters of hazy, amber fluid >>   Wbc 1859 L/M > P and exudative by Prot, nl glucose/ cyt>> neg/ predominently T cells, no evidence of lymphoma or chylous effusion     12/12/2015  f/u ov/Khylee Algeo re:  L exudative effusion  Chief Complaint  Patient presents with  . Follow-up    SOB and cough are unchanged. No new co's today.   cough some better daytime but not hs No change at all with L tap in terms of cough or sob despite almost complete evacuation of effusion rec Try propping up as much as you can when sleeping Please schedule a follow up office visit in 6 weeks, call sooner if needed   add:   Needs repeat labs including Quant Gold, ESR on f/u     01/27/2016  f/u ov/Teola Felipe re: recurrent L transudative effusion Chief Complaint  Patient presents with  . Follow-up    Pt c/o increased SOB for the past 2 wks, esp worse when he lies down and has to sleep propped up on  2 pillows. His cough is unchanged.   only cough at hs/ dry.  Onset of worsening orthopnea insidious/minimally progressive x 2 weeks/  no assoc cp  / better sitting up      No obvious day to day or daytime variability or assoc excess/ purulent sputum or mucus plugs or hemoptysis or   chest tightness, subjective wheeze or overt sinus or hb symptoms. No unusual exp hx or h/o childhood pna/ asthma or knowledge of premature birth.  Sleeping ok without nocturnal  or early am exacerbation  of respiratory  c/o's or need for noct saba. Also denies any obvious fluctuation of symptoms with weather or environmental changes or other aggravating or alleviating factors except as outlined above   Current Medications, Allergies, Complete Past Medical History, Past Surgical History, Family History, and Social History were reviewed in [employer] record.  ROS  The following are not active complaints unless bolded sore throat, dysphagia, dental problems, itching, sneezing,  nasal congestion or excess/ purulent secretions, ear ache,   fever, chills, sweats, unintended wt loss, classically pleuritic or exertional cp,  orthopnea pnd or leg swelling, presyncope, palpitations, abdominal pain, anorexia, nausea, vomiting, diarrhea  or change in bowel or bladder habits, change in stools or urine, dysuria,hematuria,  rash, arthralgias, visual complaints, headache, numbness, weakness or ataxia or problems with walking or coordination,  change in mood/affect or memory.             Objective:   Physical Exam   amb wm nad   01/27/2016          194  12/12/2015       191   11/14/15 193 lb 6.4 oz (87.726 kg)  11/13/15 193 lb (87.544 kg)  11/12/15 194 lb 11.2 oz (88.315 kg)    Vital signs reviewed    HEENT: nl dentition, turbinates, and oropharynx. Nl external ear canals without cough reflex   NECK :  without JVD/Nodes/TM/ nl carotid upstrokes bilaterally   LUNGS: no acc muscle use,  Nl contour  chest with dullness and decreased bs on L 2/3 up  CV:  RRR  no s3 or murmur or increase in P2,  1+ pitting lower ext sym  edema   ABD:  soft and nontender with nl inspiratory excursion in the supine position. No bruits or organomegaly, bowel sounds nl  MS:  Nl gait/ ext warm without deformities, calf tenderness, cyanosis or clubbing No obvious joint restrictions   SKIN: warm and dry without lesions    NEURO:  alert, approp, nl sensorium with  no motor deficits       CXR PA and Lateral:   01/27/2016 :    I personally reviewed images and agree with radiology impression as follows:    Stable left pleural effusion and associated left basilar atelectasis. Underlying pneumonia cannot be excluded.         Labs ordered/ reviewed:      Chemistry      Component Value Date/Time   NA 138 01/27/2016 1119   NA 139 11/11/2015 1435   K 4.7 01/27/2016 1119   K 4.0 11/11/2015 1435   CL 105 01/27/2016 1119   CL 107 09/27/2012 0940   CO2 29 01/27/2016 1119   CO2 26 11/11/2015 1435   BUN 16 01/27/2016 1119   BUN 15.7 11/11/2015 1435   CREATININE 1.46 01/27/2016 1119   CREATININE 1.4 (H) 11/11/2015 1435      Component Value Date/Time   CALCIUM 8.8 01/27/2016 1119   CALCIUM 8.6 11/11/2015 1435   ALKPHOS 65 01/27/2016 1119   ALKPHOS 86 11/11/2015 1435   AST 18 01/27/2016 1119   AST 16 11/11/2015 1435   ALT 17 01/27/2016 1119   ALT 19 11/11/2015 1435   BILITOT 0.5 01/27/2016 1119   BILITOT 0.33 11/11/2015 1435     Albumin   3.7                                     01/27/2016       Lab Results  Component Value Date   WBC 4.5 01/27/2016   HGB 10.8 (L) 01/27/2016   HCT 34.1 (L) 01/27/2016   MCV 76.6 (L) 01/27/2016   PLT 51.0 Repeated and verified X2. (L) 01/27/2016     No results found for: [facility]    Lab Results  Component Value Date   TSH 0.785 01/20/2011     Lab Results  Component Value Date   PROBNP 311.0 (H) 01/27/2016       Lab Results  Component Value Date    ESRSEDRATE 19 01/27/2016   ESRSEDRATE 1 10/31/2014   ESRSEDRATE 4 08/10/2010  Assessment & Plan:

## 2016-01-28 LAB — QUANTIFERON TB GOLD ASSAY (BLOOD)
Interferon Gamma Release Assay: NEGATIVE
Mitogen-Nil: >10 IU/mL
QUANTIFERON TB AG MINUS NIL: 0.01 [IU]/mL
Quantiferon Nil Value: 0.02 IU/mL

## 2016-01-28 NOTE — Assessment & Plan Note (Signed)
Echo 07/02/2014  Normal LV function; grade 1 diastolic dysfunction; moderate LAE; calcified aortic valve with mild AS; mildly elevated pulmonary pressure. - BNP  01/27/2016 = 311   L sided isolated exudative effusion doesn't fit with chf but could have component of diastolic dysfunction contributing to symptoms esp in pt with mild anemia as well

## 2016-01-28 NOTE — Assessment & Plan Note (Signed)
  Lab Results  Component Value Date   HGB 10.8 (L) 01/27/2016   HGB 11.5 (L) 11/11/2015   HGB 11.3 (L) 11/04/2015   HGB 10.3 (L) 08/08/2015     Lab Results  Component Value Date   PLT 51.0 Repeated and verified X2. (L) 01/27/2016   PLT 54 (L) 11/11/2015   PLT 52 (L) 11/04/2015   PLT 50 (L) 08/08/2015     Labs reviewed - advised to hold plavix until after thoracentesis but probably ok to continue longterm given plts are chronically low / hgb unchanged/ no tendency to bleeding from nicks or shaving > f/u heme/onc planned

## 2016-01-29 ENCOUNTER — Ambulatory Visit (INDEPENDENT_AMBULATORY_CARE_PROVIDER_SITE_OTHER): Payer: Medicare Other | Admitting: *Deleted

## 2016-01-29 DIAGNOSIS — E538 Deficiency of other specified B group vitamins: Secondary | ICD-10-CM

## 2016-01-29 DIAGNOSIS — D519 Vitamin B12 deficiency anemia, unspecified: Secondary | ICD-10-CM

## 2016-01-29 NOTE — Progress Notes (Signed)
Patient seen in office for Vitamin B 12 injection.   Tolerated IM administration well.  

## 2016-01-29 NOTE — Progress Notes (Signed)
Spoke with pt and notified of results per Dr. [name]. Pt verbalized understanding and denied any questions. 

## 2016-01-30 ENCOUNTER — Ambulatory Visit (HOSPITAL_COMMUNITY)
Admission: RE | Admit: 2016-01-30 | Discharge: 2016-01-30 | Disposition: A | Discharge status: Home / Self Care | Payer: Medicare Other | Source: Ambulatory Visit | Attending: Internal Medicine | Admitting: Internal Medicine

## 2016-01-30 ENCOUNTER — Ambulatory Visit (HOSPITAL_COMMUNITY)
Admission: RE | Admit: 2016-01-30 | Discharge: 2016-01-30 | Disposition: A | Discharge status: Home / Self Care | Payer: Medicare Other | Source: Ambulatory Visit | Attending: Radiology | Admitting: Radiology

## 2016-01-30 DIAGNOSIS — J90 Pleural effusion, not elsewhere classified: Secondary | ICD-10-CM | POA: Insufficient documentation

## 2016-01-30 DIAGNOSIS — R846 Abnormal cytological findings in specimens from respiratory organs and thorax: Secondary | ICD-10-CM | POA: Diagnosis not present

## 2016-01-30 LAB — GLUCOSE, SEROUS FLUID: Glucose, Fluid: 126 mg/dL

## 2016-01-30 LAB — BODY FLUID CELL COUNT WITH DIFFERENTIAL
LYMPHS FL: 91 %
MONOCYTE-MACROPHAGE-SEROUS FLUID: 9 % — AB (ref 50–90)
Total Nucleated Cell Count, Fluid: 1432 cu mm — ABNORMAL HIGH (ref 0–1000)

## 2016-01-30 LAB — PROTEIN, BODY FLUID: Total protein, fluid: 4.6 g/dL

## 2016-01-30 LAB — LACTATE DEHYDROGENASE, PLEURAL OR PERITONEAL FLUID: LD, Fluid: 135 U/L — ABNORMAL HIGH (ref 3–23)

## 2016-01-30 NOTE — Procedures (Signed)
Successful US guided left thoracentesis. Yielded 2.2L of hazy, amber colored fluid. Pt tolerated procedure well. No immediate complications.  Specimen was sent for labs. CXR ordered.  Ascencion Dike PA-C 01/30/2016 10:05 AM

## 2016-01-31 LAB — TRIGLYCERIDES, BODY FLUIDS: Triglycerides, Fluid: 17 mg/dL

## 2016-02-04 ENCOUNTER — Other Ambulatory Visit: Payer: Self-pay | Admitting: Internal Medicine

## 2016-02-04 DIAGNOSIS — J90 Pleural effusion, not elsewhere classified: Secondary | ICD-10-CM

## 2016-02-05 ENCOUNTER — Encounter: Payer: Self-pay | Admitting: Cardiothoracic Surgery

## 2016-02-05 ENCOUNTER — Institutional Professional Consult (permissible substitution) (INDEPENDENT_AMBULATORY_CARE_PROVIDER_SITE_OTHER): Payer: Medicare Other | Admitting: Cardiothoracic Surgery

## 2016-02-05 VITALS — BP 149/74 | HR 78 | Resp 18 | Ht 66.0 in | Wt 191.0 lb

## 2016-02-05 DIAGNOSIS — I2510 Atherosclerotic heart disease of native coronary artery without angina pectoris: Secondary | ICD-10-CM

## 2016-02-05 DIAGNOSIS — J948 Other specified pleural conditions: Secondary | ICD-10-CM | POA: Diagnosis not present

## 2016-02-05 DIAGNOSIS — J90 Pleural effusion, not elsewhere classified: Secondary | ICD-10-CM

## 2016-02-05 NOTE — H&P (Addendum)
PCP is [name], MD Referring Provider is Tanda Rockers, MD  Chief Complaint  Patient presents with  . Pleural Effusion    left...has had two thoracentesis in the past..CT CHEST 11/11/15  Patient examined, the skin of chest personally reviewed and counseled with patient  HPI: Very nice but fragile 82 year old gentleman with recurrent left pleural effusion. He is required thoracentesis twice since June of this year. Cytology has been negative for malignancy but with significant lymphocytes and protein. The patient's past medical history is positive for follicular lymphoma treated, current myelodysplasia with thrombocytopenia, previous stroke several years ago on chronic Plavix, mild aortic stenosis, diabetes mellitus, remote history of prostate cancer, and hypertension.  The patient has been quite symptomatic with recurrent left pleural effusion-shortness of breath and coughing. Both thoracentesis procedures removed 1.5-2.2 L. The right pleural space has been clear.  Past Medical History:  Diagnosis Date  . Adenomatous colon polyp   . CAD (coronary artery disease)    30% LAD Stenosis, 70% ramus intermedius stenosis, treated with PTCA and angioplasty by Dr Albertine Patricia 2004  . Cataract    right eye  . Cough, persistent 11/04/2015  . Deficiency anemia 04/19/2014  . Diverticulosis   . DM (diabetes mellitus) ([place])   . DVT (deep venous thrombosis) ([place])    secondary to surgery  . Dyslipidemia   . Hyperlipidemia   . Hypertension   . Inguinal hernia    right  . 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 Microcytic anemia 07/07/2015  . 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 Non Hodgkin's lymphoma ([place])   . Pleural effusion, left 11/04/2015  . Prostate CA ([place]) 09/10/2011   Gleason 3+3 R, 3+4 L lobe May 2007 Rx Radioactive seed implants Dr [name]  . Prostate cancer ([place])    seed implants no recurrence  . PVD (peripheral vascular disease) ([place])    rt  renal artery stent  . Renal insufficiency   . Thrombocytopenia ([place])   . Thrombotic stroke ([place]) 09/10/2011   January 18, 2011 infarct genu & post limb R internal capsule - acute; previous lacunar infarcts/extensive white matter dis  . Vitamin D deficiency     Past Surgical History:  Procedure Laterality Date  . APPENDECTOMY     patient ?  . COLONOSCOPY    . CYSTOURETHROSCOPY     ROBOTIC ARM NUCLETRON SEED IMPLANTATION OF PROSTATE  . EXPLORATORY LAPAROTOMY     For evaluation of lymphoma    Family History  Problem Relation Age of Onset  . Lymphoma Sister   . Prostate cancer Brother   . Breast cancer Sister   . Diabetes Brother   . Diabetes Sister   . Heart disease Brother   . Asthma Son     Social History Social History  Substance Use Topics  . Smoking status: Former Smoker    Packs/day: 0.50    Years: 20.00    Types: Cigarettes    Quit date: 06/21/1958  . Smokeless tobacco: Never Used  . Alcohol use No    Current Outpatient Prescriptions  Medication Sig Dispense Refill  . acetaminophen (TYLENOL) 500 MG tablet Take 500 mg by mouth every 6 (six) hours as needed for pain.    Marland Kitchen clopidogrel (PLAVIX) 75 MG tablet TAKE 1 TABLET BY MOUTH DAILY WITH BREAKFAST. 90 tablet 4  . doxazosin (CARDURA) 4 MG tablet TAKE 1 TABLET BY MOUTH ONCE A DAY 30 tablet 3  . eltrombopag (  PROMACTA) 50 MG tablet Take 1 tablet (50 mg total) by mouth daily. Take on an empty stomach 1 hour before a meal or 2 hours after 30 tablet 6  . glucose blood test strip 1 each by Other route daily. Fasting each morning 100 each 3  . HYDROcodone-acetaminophen (NORCO) 5-325 MG tablet Take 1 tablet by mouth every 6 (six) hours as needed for moderate pain. 30 tablet 0  . IRON PO Take 1 tablet by mouth daily.    Marland Kitchen LANTUS SOLOSTAR 100 UNIT/ML Solostar Pen INJECT 25 UNITS INTO THE SKIN AT BEDTIME. 15 pen 3  . meclizine (ANTIVERT) 12.5 MG tablet Take 1 tablet (12.5 mg total) by mouth 3 (three) times daily as needed for  dizziness. 30 tablet 0  . metoCLOPramide (REGLAN) 5 MG tablet Take 1 tablet (5 mg total) by mouth 3 (three) times daily before meals. (Patient taking differently: Take 5 mg by mouth 3 (three) times daily as needed. ) 90 tablet 0  . metoprolol succinate (TOPROL-XL) 100 MG 24 hr tablet TAKE 1 TABLET BY MOUTH ONCE A DAY 90 tablet 3  . Multiple Vitamins-Minerals (MULTIVITAMIN ADULT PO) Take 1 tablet by mouth daily.    . valsartan (DIOVAN) 160 MG tablet Take 1 tablet (160 mg total) by mouth daily. 30 tablet 11   Current Facility-Administered Medications  Medication Dose Route Frequency Provider Last Rate Last Dose  . cyanocobalamin ((VITAMIN B-12)) injection 1,000 mcg  1,000 mcg Intramuscular Q30 days [name], MD   1,000 mcg at 01/29/16 1401    Allergies  Allergen Reactions  . Glucophage [Metformin Hydrochloride] Other (See Comments)    Chest pain  . Zetia [Ezetimibe] Other (See Comments)    weakness  . Fenofibrate Rash  . Niacin-Lovastatin Er Rash    Review of Systems      The patient denies bleeding problems associated with Plavix but he does bruise easily      The patient denies problems of general anesthesia for previous operations.  Review of Systems :  [ y ] = yes, [  ] = no        General :  Weight gain [   ]    Weight loss  [ yes  ]  Fatigue [ yes ]  Fever [  ]  Chills  [  ]                                Weakness  [  ]           HEENT    Headache [  ]  Dizziness [  ]  Blurred vision [  ] Glaucoma  [  ]                          Nosebleeds [  ] Painful or loose teeth [  ]        Cardiac :  Chest pain/ pressure [  ]  Resting SOB Totoro.Blacker  ] exertional SOB [ yes ]                        Orthopnea [  ]  Pedal edema  [  ]  Palpitations [  ] Syncope/presyncope [ ]                         Paroxysmal  nocturnal dyspnea [  ]echo shows mild aortic stenosis with normal LV systolic function         Pulmonary : cough Esteban.Cross  ]  wheezing [  ]  Hemoptysis [  ] Sputum [  ] Snoring [  ]                               Pneumothorax [  ]  Sleep apnea [  ]        GI : Vomiting [  ]  Dysphagia [  ]  Melena  [  ]  Abdominal pain [  ] BRBPR [  ]              Heart burn [  ]  Constipation [  ] Diarrhea  [  ] Colonoscopy [   ]              Poor appetite       GU : Hematuria [  ]  Dysuria [  ]  Nocturia [  ] UTI's [  ]        Vascular : Claudication [  ]  Rest pain [  ]  DVT [yes and past history  ] Vein stripping [  ] leg ulcers [  ]                          TIA [  ] Stroke [  ]  Varicose veins [  ]        NEURO :  Headaches  [  ] Seizures [  ] Vision changes [  ] Paresthesias [  ]                                       Seizures [  ]hard of hearing        Musculoskeletal :  Arthritis Totoro.Blacker  ] Gout  [  ]  Back pain [  ]  Joint pain [  ]        Skin :  Rash [  ]  Melanoma [  ] Sores [  ]        Heme : Bleeding problems [ yes myelodysplasia with thrombocytopenia ]Clotting Disorders [  ] Anemia [  ]Blood Transfusion [yes not recent ]        Endocrine : Diabetes [ yes ] Heat or Cold intolerance [  ] Polyuria [  ]excessive thirst [ ]         Psych : Depression [  ]  Anxiety [  ]  Psych hospitalizations [  ] Memory change [  ]                                               BP (!) 149/74   Pulse 78   Resp 18   Ht 5\' 6"  (1.676 m)   Wt 191 lb (86.6 kg)   SpO2 95% Comment: ON RA  BMI 30.83 kg/m  Physical Exam      Physical Exam  General: Elderly frail gentleman no acute distress HEENT: Normocephalic pupils equal , dentition adequate Neck: Supple without JVD, adenopathy, or bruit Chest: Clear to auscultation,  decreased left side l breath sounds, no rhonchi, no tenderness             or deformity Cardiovascular: Regular rate and rhythm, 1/6 systolic ejection murmur, no gallop, peripheral pulses             palpable in all extremities Abdomen:  Soft, nontender, no palpable mass or organomegaly Extremities: Warm, well-perfused, no clubbing cyanosis  or tenderness, 2+ ankle edema               no venous stasis changes of the legs Rectal/GU: Deferred Neuro: Grossly non--focal and symmetrical throughout Skin: Clean and dry without rash or ulceration  Diagnostic Tests: Chest CT scan without evidence of pulmonary mass or suspicious mediastinal adenopathy. Left pleural effusion has been significant and recurrent in between thoracentesis procedures  Impression: Recurrent symptomatically left pleural effusion Agree with recommendation for left Pleurx catheter for improvement of symptoms and hopefully to resolve  recurrent effusion  Plan: Left Pleurx catheter to be placed at [place] August 22 local standby anesthesia    [MAC} the patient will stop his Plavix today Len Childs, MD [facility] [phone]

## 2016-02-06 ENCOUNTER — Other Ambulatory Visit: Payer: Self-pay | Admitting: *Deleted

## 2016-02-06 ENCOUNTER — Telehealth: Payer: Self-pay | Admitting: *Deleted

## 2016-02-06 DIAGNOSIS — J90 Pleural effusion, not elsewhere classified: Secondary | ICD-10-CM

## 2016-02-06 NOTE — Telephone Encounter (Signed)
Patient has been instructed about pleurex insertion scheduled for Tuesday 8/22 with [name].  Patient's last dose of plavix was 8/17.  Per [name], patient can have a clear liquid breakfast.  Patient and his wife understand and have no further questions.  I did tell them that the hospital will call today or Monday with further pre-op instructions.

## 2016-02-07 ENCOUNTER — Other Ambulatory Visit: Payer: Self-pay | Admitting: Family Medicine

## 2016-02-09 ENCOUNTER — Encounter (HOSPITAL_COMMUNITY): Payer: Self-pay | Admitting: *Deleted

## 2016-02-09 MED ORDER — DEXTROSE 5 % IV SOLN
1.5000 g | INTRAVENOUS | Status: AC
  Administered 2016-02-10: 1.5 g via INTRAVENOUS
  Filled 2016-02-09: qty 1.5

## 2016-02-09 NOTE — Progress Notes (Signed)
Spoke with pt's wife, Kennyth Lose for pre-op call. Pt was in room and I could hear him answer questions. Pt has long history of CAD, sees Dr. [name]. Last office visit was in May. Pt denies any recent chest pain. Does have sob due to pleural effusion. Pt is diabetic. States his fasting blood sugar usually runs around 120-130. Last A1C was 6.7 on 12/29/15. Instructed pt to take 1/2 of his regular dose of insulin in the AM (will take 7 units). If blood sugar is 70 or below, treat with 1/2 cup of clear juice ([place] or cranberry) and recheck blood sugar 15 minutes after drinking juice. If blood sugar continues to be 70 or below, call the Short Stay department and ask to speak to a nurse. Mrs. Voorheis voiced understanding.

## 2016-02-09 NOTE — Telephone Encounter (Signed)
Refill appropriate and filled per protocol. 

## 2016-02-10 ENCOUNTER — Ambulatory Visit (HOSPITAL_COMMUNITY): Payer: Medicare Other | Admitting: Certified Registered Nurse Anesthetist

## 2016-02-10 ENCOUNTER — Encounter (HOSPITAL_COMMUNITY)
Admission: RE | Disposition: A | Discharge status: Home / Self Care | Payer: Self-pay | Source: Ambulatory Visit | Attending: Cardiothoracic Surgery

## 2016-02-10 ENCOUNTER — Ambulatory Visit (HOSPITAL_COMMUNITY): Payer: Medicare Other

## 2016-02-10 ENCOUNTER — Encounter (HOSPITAL_COMMUNITY): Payer: Self-pay | Admitting: Anesthesiology

## 2016-02-10 ENCOUNTER — Ambulatory Visit (HOSPITAL_COMMUNITY)
Admission: RE | Admit: 2016-02-10 | Discharge: 2016-02-10 | Disposition: A | Discharge status: Home / Self Care | Payer: Medicare Other | Source: Ambulatory Visit | Attending: Cardiothoracic Surgery | Admitting: Cardiothoracic Surgery

## 2016-02-10 DIAGNOSIS — Z9221 Personal history of antineoplastic chemotherapy: Secondary | ICD-10-CM | POA: Insufficient documentation

## 2016-02-10 DIAGNOSIS — D696 Thrombocytopenia, unspecified: Secondary | ICD-10-CM | POA: Insufficient documentation

## 2016-02-10 DIAGNOSIS — Z794 Long term (current) use of insulin: Secondary | ICD-10-CM | POA: Insufficient documentation

## 2016-02-10 DIAGNOSIS — E1151 Type 2 diabetes mellitus with diabetic peripheral angiopathy without gangrene: Secondary | ICD-10-CM | POA: Insufficient documentation

## 2016-02-10 DIAGNOSIS — Z419 Encounter for procedure for purposes other than remedying health state, unspecified: Secondary | ICD-10-CM

## 2016-02-10 DIAGNOSIS — D649 Anemia, unspecified: Secondary | ICD-10-CM | POA: Insufficient documentation

## 2016-02-10 DIAGNOSIS — I2510 Atherosclerotic heart disease of native coronary artery without angina pectoris: Secondary | ICD-10-CM | POA: Diagnosis not present

## 2016-02-10 DIAGNOSIS — Z79899 Other long term (current) drug therapy: Secondary | ICD-10-CM | POA: Insufficient documentation

## 2016-02-10 DIAGNOSIS — Z87891 Personal history of nicotine dependence: Secondary | ICD-10-CM | POA: Diagnosis not present

## 2016-02-10 DIAGNOSIS — Z8546 Personal history of malignant neoplasm of prostate: Secondary | ICD-10-CM | POA: Diagnosis not present

## 2016-02-10 DIAGNOSIS — Z86718 Personal history of other venous thrombosis and embolism: Secondary | ICD-10-CM | POA: Diagnosis not present

## 2016-02-10 DIAGNOSIS — J90 Pleural effusion, not elsewhere classified: Secondary | ICD-10-CM | POA: Diagnosis not present

## 2016-02-10 DIAGNOSIS — Z8673 Personal history of transient ischemic attack (TIA), and cerebral infarction without residual deficits: Secondary | ICD-10-CM | POA: Insufficient documentation

## 2016-02-10 DIAGNOSIS — I739 Peripheral vascular disease, unspecified: Secondary | ICD-10-CM | POA: Diagnosis not present

## 2016-02-10 DIAGNOSIS — Z7902 Long term (current) use of antithrombotics/antiplatelets: Secondary | ICD-10-CM | POA: Insufficient documentation

## 2016-02-10 DIAGNOSIS — I10 Essential (primary) hypertension: Secondary | ICD-10-CM | POA: Insufficient documentation

## 2016-02-10 DIAGNOSIS — Z8572 Personal history of non-Hodgkin lymphomas: Secondary | ICD-10-CM | POA: Diagnosis not present

## 2016-02-10 DIAGNOSIS — Z452 Encounter for adjustment and management of vascular access device: Secondary | ICD-10-CM | POA: Diagnosis not present

## 2016-02-10 HISTORY — PX: CHEST TUBE INSERTION: SHX231

## 2016-02-10 LAB — COMPREHENSIVE METABOLIC PANEL
ALT: 18 U/L (ref 17–63)
AST: 23 U/L (ref 15–41)
Albumin: 3.3 g/dL — ABNORMAL LOW (ref 3.5–5.0)
Alkaline Phosphatase: 66 U/L (ref 38–126)
Anion gap: 7 (ref 5–15)
BUN: 15 mg/dL (ref 6–20)
CO2: 24 mmol/L (ref 22–32)
Calcium: 8.4 mg/dL — ABNORMAL LOW (ref 8.9–10.3)
Chloride: 105 mmol/L (ref 101–111)
Creatinine, Ser: 1.51 mg/dL — ABNORMAL HIGH (ref 0.61–1.24)
GFR calc Af Amer: 47 mL/min — ABNORMAL LOW (ref >60)
GFR calc non Af Amer: 41 mL/min — ABNORMAL LOW (ref >60)
Glucose, Bld: 87 mg/dL (ref 65–99)
Potassium: 4.2 mmol/L (ref 3.5–5.1)
Sodium: 136 mmol/L (ref 135–145)
Total Bilirubin: 0.7 mg/dL (ref 0.3–1.2)
Total Protein: 7.5 g/dL (ref 6.5–8.1)

## 2016-02-10 LAB — CBC
HCT: 37.1 % — ABNORMAL LOW (ref 39.0–52.0)
Hemoglobin: 11.1 g/dL — ABNORMAL LOW (ref 13.0–17.0)
MCH: 24.4 pg — ABNORMAL LOW (ref 26.0–34.0)
MCHC: 29.9 g/dL — ABNORMAL LOW (ref 30.0–36.0)
MCV: 81.5 fL (ref 78.0–100.0)
Platelets: 49 10*3/uL — ABNORMAL LOW (ref 150–400)
RBC: 4.55 MIL/uL (ref 4.22–5.81)
RDW: 23.7 % — ABNORMAL HIGH (ref 11.5–15.5)
WBC: 6.5 10*3/uL (ref 4.0–10.5)

## 2016-02-10 LAB — APTT: aPTT: 31 seconds (ref 24–36)

## 2016-02-10 LAB — GLUCOSE, CAPILLARY
Glucose-Capillary: 84 mg/dL (ref 65–99)
Glucose-Capillary: 81 mg/dL (ref 65–99)
Glucose-Capillary: 88 mg/dL (ref 65–99)

## 2016-02-10 LAB — PROTIME-INR
INR: 1.1
Prothrombin Time: 14.3 seconds (ref 11.4–15.2)

## 2016-02-10 SURGERY — INSERTION, PLEURAL DRAINAGE CATHETER
Anesthesia: Monitor Anesthesia Care | Site: Chest | Laterality: Left

## 2016-02-10 MED ORDER — LIDOCAINE HCL (CARDIAC) 20 MG/ML IV SOLN
INTRAVENOUS | Status: DC | PRN
  Administered 2016-02-10: 40 mg via INTRATRACHEAL

## 2016-02-10 MED ORDER — ACETAMINOPHEN 325 MG PO TABS: 650.0000 mg | ORAL_TABLET | ORAL | Status: DC | PRN

## 2016-02-10 MED ORDER — HYDROCODONE-ACETAMINOPHEN 5-325 MG PO TABS
1.0000 | ORAL_TABLET | Freq: Four times a day (QID) | ORAL | Status: DC | PRN
  Administered 2016-02-10: 1 via ORAL

## 2016-02-10 MED ORDER — ACETAMINOPHEN 500 MG PO TABS: 1000.0000 mg | ORAL_TABLET | Freq: Four times a day (QID) | ORAL | Status: DC

## 2016-02-10 MED ORDER — FENTANYL CITRATE (PF) 100 MCG/2ML IJ SOLN: 25.0000 ug | INTRAMUSCULAR | Status: DC | PRN

## 2016-02-10 MED ORDER — FENTANYL CITRATE (PF) 100 MCG/2ML IJ SOLN: 12.5000 ug | INTRAMUSCULAR | Status: DC | PRN

## 2016-02-10 MED ORDER — PROPOFOL 10 MG/ML IV BOLUS
INTRAVENOUS | Status: DC | PRN
  Administered 2016-02-10 (×2): 10 mg via INTRAVENOUS

## 2016-02-10 MED ORDER — 0.9 % SODIUM CHLORIDE (POUR BTL) OPTIME
TOPICAL | Status: DC | PRN
  Administered 2016-02-10: 1000 mL

## 2016-02-10 MED ORDER — SODIUM CHLORIDE 0.9% FLUSH: 3.0000 mL | INTRAVENOUS | Status: DC | PRN

## 2016-02-10 MED ORDER — LACTATED RINGERS IV SOLN
INTRAVENOUS | Status: DC | PRN
  Administered 2016-02-10: 16:00:00 via INTRAVENOUS

## 2016-02-10 MED ORDER — PROMETHAZINE HCL 25 MG/ML IJ SOLN: 6.2500 mg | INTRAMUSCULAR | Status: DC | PRN

## 2016-02-10 MED ORDER — FENTANYL CITRATE (PF) 100 MCG/2ML IJ SOLN
INTRAMUSCULAR | Status: DC | PRN
  Administered 2016-02-10: 50 ug via INTRAVENOUS
  Administered 2016-02-10 (×2): 100 ug via INTRAVENOUS

## 2016-02-10 MED ORDER — HYDROCODONE-ACETAMINOPHEN 5-325 MG PO TABS
ORAL_TABLET | ORAL | Status: AC
  Filled 2016-02-10: qty 1

## 2016-02-10 MED ORDER — MEPERIDINE HCL 25 MG/ML IJ SOLN: 6.2500 mg | INTRAMUSCULAR | Status: DC | PRN

## 2016-02-10 MED ORDER — FENTANYL CITRATE (PF) 250 MCG/5ML IJ SOLN
INTRAMUSCULAR | Status: AC
  Filled 2016-02-10: qty 5

## 2016-02-10 MED ORDER — LIDOCAINE 1 % OPTIME INJ - NO CHARGE
INTRAMUSCULAR | Status: DC | PRN
  Administered 2016-02-10: 5 mL

## 2016-02-10 MED ORDER — SODIUM CHLORIDE 0.9% FLUSH: 3.0000 mL | Freq: Two times a day (BID) | INTRAVENOUS | Status: DC

## 2016-02-10 MED ORDER — SODIUM CHLORIDE 0.9 % IV SOLN: 250.0000 mL | INTRAVENOUS | Status: DC | PRN

## 2016-02-10 MED ORDER — ACETAMINOPHEN 650 MG RE SUPP: 650.0000 mg | RECTAL | Status: DC | PRN

## 2016-02-10 MED ORDER — OXYCODONE HCL 5 MG PO TABS: 5.0000 mg | ORAL_TABLET | ORAL | Status: DC | PRN

## 2016-02-10 MED ORDER — ONDANSETRON HCL 4 MG/2ML IJ SOLN
INTRAMUSCULAR | Status: DC | PRN
  Administered 2016-02-10: 4 mg via INTRAVENOUS

## 2016-02-10 MED ORDER — LACTATED RINGERS IV SOLN
INTRAVENOUS | Status: DC
  Administered 2016-02-10: 14:00:00 via INTRAVENOUS

## 2016-02-10 SURGICAL SUPPLY — 28 items
CANISTER SUCTION 2500CC (MISCELLANEOUS) ×3 IMPLANT
COVER SURGICAL LIGHT HANDLE (MISCELLANEOUS) ×3 IMPLANT
DERMABOND ADVANCED (GAUZE/BANDAGES/DRESSINGS) ×2
DERMABOND ADVANCED .7 DNX12 (GAUZE/BANDAGES/DRESSINGS) ×1 IMPLANT
DRAPE C-ARM 42X72 X-RAY (DRAPES) ×3 IMPLANT
DRAPE LAPAROSCOPIC ABDOMINAL (DRAPES) ×3 IMPLANT
GLOVE BIO SURGEON STRL SZ 6.5 (GLOVE) ×2 IMPLANT
GLOVE BIO SURGEON STRL SZ7.5 (GLOVE) ×6 IMPLANT
GLOVE BIO SURGEONS STRL SZ 6.5 (GLOVE) ×1
GOWN STRL REUS W/ TWL LRG LVL3 (GOWN DISPOSABLE) ×2 IMPLANT
GOWN STRL REUS W/TWL LRG LVL3 (GOWN DISPOSABLE) ×4
KIT BASIN OR (CUSTOM PROCEDURE TRAY) ×3 IMPLANT
KIT PLEURX DRAIN CATH 1000ML (MISCELLANEOUS) ×9 IMPLANT
KIT PLEURX DRAIN CATH 15.5FR (DRAIN) ×3 IMPLANT
KIT ROOM TURNOVER OR (KITS) ×3 IMPLANT
NEEDLE HYPO 25GX1X1/2 BEV (NEEDLE) ×3 IMPLANT
NS IRRIG 1000ML POUR BTL (IV SOLUTION) ×3 IMPLANT
PACK GENERAL/GYN (CUSTOM PROCEDURE TRAY) ×3 IMPLANT
PAD ARMBOARD 7.5X6 YLW CONV (MISCELLANEOUS) ×6 IMPLANT
SET DRAINAGE LINE (MISCELLANEOUS) IMPLANT
SUT ETHILON 3 0 PS 1 (SUTURE) ×3 IMPLANT
SUT SILK 2 0 SH (SUTURE) ×3 IMPLANT
SUT VIC AB 3-0 SH 8-18 (SUTURE) ×3 IMPLANT
SYR CONTROL 10ML LL (SYRINGE) ×3 IMPLANT
TOWEL OR 17X24 6PK STRL BLUE (TOWEL DISPOSABLE) ×3 IMPLANT
TOWEL OR 17X26 10 PK STRL BLUE (TOWEL DISPOSABLE) ×3 IMPLANT
VALVE REPLACEMENT CAP (MISCELLANEOUS) IMPLANT
WATER STERILE IRR 1000ML POUR (IV SOLUTION) ×3 IMPLANT

## 2016-02-10 NOTE — Anesthesia Postprocedure Evaluation (Signed)
Anesthesia Post Note  Patient: Ricardo Watkins  Procedure(s) Performed: Procedure(s) (LRB): INSERTION PLEURAL DRAINAGE CATHETER (Left)  Patient location during evaluation: PACU Anesthesia Type: MAC Level of consciousness: awake, awake and alert and oriented Pain management: pain level controlled Vital Signs Assessment: post-procedure vital signs reviewed and stable Respiratory status: spontaneous breathing, nonlabored ventilation and respiratory function stable Cardiovascular status: blood pressure returned to baseline Anesthetic complications: no    Last Vitals:  Vitals:   02/10/16 1700 02/10/16 1715  BP: 126/75 133/72  Pulse: 81 77  Resp: 16 16  Temp: 36.8 C     Last Pain:  Vitals:   02/10/16 1700  TempSrc:   PainSc: 0-No pain                 Roen Macgowan COKER

## 2016-02-10 NOTE — OR Nursing (Signed)
Pt, pt wife and daughter were at bedside and were given discharge instructions. Pt and family were told that home health nurse would be out Friday for a visit and that the office would call them back for a follow up visit. Pt was also told to keep dressing clean and dry and to take Tylenol per MD for pain and if that didn't help with pain to call MD office and family stated that they had office number to call if they had any questions or concerns. Pt voided before discharge and tolerated drinking fluids well. Pt and family denied any questions regarding discharge teaching.

## 2016-02-10 NOTE — Discharge Instructions (Signed)
Chest Tube °A chest tube is a small, flexible drainage tube that is put into the chest. The tube drains fluid, blood, or extra air that has built up between the lungs and the inside of the chest wall (pleural space). Fluid or air can build up in this area for various reasons. When this occurs, it can prevent the lung from expanding completely and cause breathing problems. This can be dangerous. The chest tube allows the lung to re-expand. °The procedure to put in the chest tube involves inserting the tube through the skin between the ribs and into the pleural space. °LET YOUR HEALTH CARE PROVIDER KNOW ABOUT:  °· Any allergies you have. °· All medicines you are taking, including vitamins, herbs, eye drops, creams, and over-the-counter medicines. °· Previous problems you or members of your family have had with the use of anesthetics. °· Any blood disorders you have. °· Previous surgeries you have had. °· Medical conditions you have. °RISKS AND COMPLICATIONS °Generally, this is a safe procedure. However, as with any procedure, problems can occur. Possible problems include: °· Bleeding. °· Injury to the lung. °· Infection. °· Chest tube failing to work properly, usually due to leaking of air around the tube, or tube positioning in a place where all of the fluid or air cannot be drained. °· Problems related to the use of anesthetics or pain medicines. °BEFORE THE PROCEDURE  °Ask your health care provider if there are any special preparatory instructions such as not eating before the procedure. Follow these instructions exactly. °PROCEDURE  °The area where the chest tube will be inserted is numbed with a medicine (local anesthetic). You may be given medicine for pain and medicine to help you relax (sedative). An incision is made between the ribs, and a small opening is made through the inner lining of the chest wall. The chest tube is inserted through this opening and into the pleural space. The other end of the chest  tube may be connected to a plastic container that collects the fluid drained from the pleural space and has sterile water to make a one-way seal, or "water seal," that prevents air from going back into the pleural space. Suction is sometimes attached to the system for drainage. A stitch (suture) or tape is used to keep the tube in place.  °AFTER THE PROCEDURE  °A chest X-ray will be done to check the position of the chest tube. You will be monitored for breathing difficulties, air leaks in the chest tube, and the need for additional oxygen. You will be encouraged to breathe deeply. You may be given antibiotic medicine to prevent or treat infection. The chest tube will stay in place until all the extra air or fluid has drained from the chest. You will likely need to stay in the hospital until the chest tube is removed. In rare cases, you may go home with the chest tube in place. °  °This information is not intended to replace advice given to you by your health care provider. Make sure you discuss any questions you have with your health care provider. °  °Document Released: 09/15/2006 Document Revised: 06/12/2013 Document Reviewed: 12/13/2012 °Elsevier Interactive Patient Education ©2016 [employer] ° °

## 2016-02-10 NOTE — Transfer of Care (Signed)
Immediate Anesthesia Transfer of Care Note  Patient: Ricardo Watkins  Procedure(s) Performed: Procedure(s): INSERTION PLEURAL DRAINAGE CATHETER (Left)  Patient Location: PACU  Anesthesia Type:MAC  Level of Consciousness: awake, alert , oriented and patient cooperative  Airway & Oxygen Therapy: Patient Spontanous Breathing and Patient connected to nasal cannula oxygen  Post-op Assessment: Report given to RN and Post -op Vital signs reviewed and stable  Post vital signs: Reviewed and stable  Last Vitals:  Vitals:   02/10/16 1318  BP: (!) 177/78  Pulse: 74  Resp: 18  Temp: 36.7 C    Last Pain:  Vitals:   02/10/16 1318  TempSrc: Oral         Complications: No apparent anesthesia complications

## 2016-02-10 NOTE — Anesthesia Preprocedure Evaluation (Signed)
Anesthesia Evaluation  Patient identified by MRN, date of birth, ID band Patient awake    Reviewed: Allergy & Precautions, NPO status , Patient's Chart, lab work & pertinent test results  Airway Mallampati: II  TM Distance: >3 FB Neck ROM: Full    Dental no notable dental hx.    Pulmonary shortness of breath, pneumonia, former smoker,    Pulmonary exam normal breath sounds clear to auscultation       Cardiovascular hypertension, + CAD and + Peripheral Vascular Disease  negative cardio ROS Normal cardiovascular exam+ Valvular Problems/Murmurs  Rhythm:Regular Rate:Normal     Neuro/Psych  Headaches, CVA negative psych ROS   GI/Hepatic negative GI ROS, Neg liver ROS,   Endo/Other  negative endocrine ROSdiabetes  Renal/GU Renal disease     Musculoskeletal negative musculoskeletal ROS (+)   Abdominal   Peds  Hematology negative hematology ROS (+) anemia ,   Anesthesia Other Findings   Reproductive/Obstetrics negative OB ROS                             Anesthesia Physical Anesthesia Plan  ASA: III  Anesthesia Plan: MAC   Post-op Pain Management:    Induction: Intravenous  Airway Management Planned:   Additional Equipment:   Intra-op Plan:   Post-operative Plan:   Informed Consent: I have reviewed the patients History and Physical, chart, labs and discussed the procedure including the risks, benefits and alternatives for the proposed anesthesia with the patient or authorized representative who has indicated his/her understanding and acceptance.   Dental advisory given  Plan Discussed with: CRNA  Anesthesia Plan Comments:        Anesthesia Quick Evaluation

## 2016-02-10 NOTE — Progress Notes (Signed)
The patient was examined and preop studies reviewed. There has been no change from the prior exam and the patient is ready for surgery.  plan LEFT Pleurx catheter placement on D Hagerman

## 2016-02-10 NOTE — Op Note (Signed)
Ricardo Watkins, Ricardo Watkins NO.:  [id number]  MEDICAL RECORD NO.:  [id number]  LOCATION:  [facility]                         FACILITY:  [facility]  PHYSICIAN:  [name], M.D.  DATE OF BIRTH:  12-Aug-1930  DATE OF PROCEDURE:  02/10/2016 DATE OF DISCHARGE:  02/10/2016                              OPERATIVE REPORT   OPERATION:  Placement of left PleurX catheter.  PREOPERATIVE DIAGNOSIS:  Recurrent non-malignant left pleural effusion, history of treatment for lymphoma.  POSTOPERATIVE DIAGNOSIS:  Recurrent non-malignant left pleural effusion, history of treatment for lymphoma.  SURGEON:  [name], M.D.  ANESTHESIA:  Local with 1% lidocaine with IV monitored conscious sedation.  CLINICAL NOTE:  This is a very nice 82 year old gentleman, who has had multiple left thoracentesis in the past 2 months for recurrent left pleural effusion.  Cytology on the fluid has been negative for malignancy or lymphoma.  The patient has received previous chemotherapy for a follicular lymphoma.  Because of recurrent symptomatic large left pleural effusion, PleurX catheter was recommended by his medical physician.  I discussed the procedure of PleurX catheter placement including the rationale of the catheter and the postoperative care and management. 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  He also understand the low risk of infection.  They demonstrated their understanding and agreed to proceed with surgery.  OPERATIVE PROCEDURE:  The patient was placed in supine on the operating table.  An IV conscious monitored sedation was provided by the Anesthesia team.  The left chest was prepped and draped after a proper time-out.  Lidocaine was infiltrated in the anterior axillary line at the fifth interspace and at the midclavicular line at the costal margin. Two small incisions were  made in these areas.  Through the upper incision, a guidewire was placed in the left pleural space using the Seldinger technique.  Next, the PleurX catheter was tunneled from the lower incision to the upper incision.  Next, over the guidewire which had been confirmed by C-arm fluoroscopy to be in proper position, a dilator then dilator sheath system were placed in the pleural space.  Next, the PleurX catheter was then passed through the tear-away sheath into the pleural space.  The catheter was connected to the vacuum bottle and 2.2 L of fluid was removed.  The fluoroscopy and later chest x-ray confirmed the PleurX catheter being in good position.  After drainage of the fluid, the upper incision was closed in layers using interrupted 3-0 Vicryl for the subcutaneous layer and interrupted 3-0 nylon for the skin.  The lower incision was closed with a silk suture which secured the catheter at the exit site.  The catheter was then capped, coiled, and placed under a sterile waterproof dressing. The patient then returned to recovery room.     [name], M.D.     PV/MEDQ  D:  02/10/2016  T:  02/10/2016  Job:  [id number]

## 2016-02-10 NOTE — Care Management Note (Addendum)
Case Management Note  Patient Details  Name: PROMISE KERIN MRN: [id number] Date of Birth: 12/28/1930  Subjective/Objective:  Patient to be discharged with PleurX drain                  Action/Plan:Discussed choice of home health agency with patient's daughter Alonna Minium.  Juliann Pulse is agreeable to [facility] for RN services. EDCM spoke to [facility] in PACU at [facility] who faxed [facility] paperwork to be completed and faxed to [facility].  Elta Guadeloupe reports patient has a box of pleurx canisters to go home with this evening. No further EDCM needs at this time  Home health orders faxed to [facility] with confirmation of receipt.  PleurX paperwork faxed to [facility] with confirmation of receipt    Expected Discharge Date:                  Expected Discharge Plan:  [place]  In-House Referral:     Discharge planning Services  CM Consult  Post Acute Care Choice:  Home Health Choice offered to:  Adult Children  DME Arranged:    DME Agency:     HH Arranged:  RN [place] Agency:  [facility] [place] (for management of PleurX drain)  Status of Service:  Completed, signed off  If discussed at [place] of Stay Meetings, dates discussed:    Additional Comments:  EDCM spoke to RN [place] regarding home health orders for patient.  Herschel Senegal reports she will page Dr. Nils Pyle to place home health orders with face to face.  Christen Bame, Gaynell Eggleton, RN 02/10/2016, 6:59 PM

## 2016-02-10 NOTE — Brief Op Note (Signed)
02/10/2016  5:30 PM  PATIENT:  Ricardo Watkins  82 y.o. male  PRE-OPERATIVE DIAGNOSIS:  LEFT PLEURAL EFFUSION  POST-OPERATIVE DIAGNOSIS:  LEFT PLEURAL EFFUSION  PROCEDURE:  Procedure(s): INSERTION PLEURAL DRAINAGE CATHETER (Left) Drainage 2.2 L fluid from L pleural space  SURGEON:  Surgeon(s) and Role:    * [name], MD - Primary  PHYSICIAN ASSISTANT: none  ASSISTANTS: none   ANESTHESIA:   local and IV sedation  EBL:  Total I/O In: 350 [I.V.:300; IV Piggyback:50] Out: 3 [Blood:3]  BLOOD ADMINISTERED:none  DRAINS: Left Pleurx  LOCAL MEDICATIONS USED:  LIDOCAINE  and Amount: 7 ml  SPECIMEN:  No Specimen  DISPOSITION OF SPECIMEN:  N/A  COUNTS:  YES  TOURNIQUET:  * No tourniquets in log *  DICTATION: .Dragon Dictation  PLAN OF CARE: Discharge to home after PACU  PATIENT DISPOSITION:  PACU - hemodynamically stable.   Delay start of Pharmacological VTE agent (>24hrs) due to surgical blood loss or risk of bleeding: yes

## 2016-02-10 NOTE — Anesthesia Procedure Notes (Signed)
Procedure Name: MAC Date/Time: 02/10/2016 4:18 PM Performed by: Jacquiline Doe A Pre-anesthesia Checklist: Patient identified, Emergency Drugs available, Suction available and Patient being monitored Patient Re-evaluated:Patient Re-evaluated prior to inductionOxygen Delivery Method: Simple face mask and Nasal cannula Intubation Type: IV induction Placement Confirmation: positive ETCO2 Dental Injury: Teeth and Oropharynx as per pre-operative assessment

## 2016-02-11 ENCOUNTER — Encounter (HOSPITAL_COMMUNITY): Payer: Self-pay | Admitting: Cardiothoracic Surgery

## 2016-02-12 DIAGNOSIS — Z8673 Personal history of transient ischemic attack (TIA), and cerebral infarction without residual deficits: Secondary | ICD-10-CM | POA: Diagnosis not present

## 2016-02-12 DIAGNOSIS — Z4682 Encounter for fitting and adjustment of non-vascular catheter: Secondary | ICD-10-CM | POA: Diagnosis not present

## 2016-02-12 DIAGNOSIS — Z923 Personal history of irradiation: Secondary | ICD-10-CM | POA: Diagnosis not present

## 2016-02-12 DIAGNOSIS — I739 Peripheral vascular disease, unspecified: Secondary | ICD-10-CM | POA: Diagnosis not present

## 2016-02-12 DIAGNOSIS — D696 Thrombocytopenia, unspecified: Secondary | ICD-10-CM | POA: Diagnosis not present

## 2016-02-12 DIAGNOSIS — Z9221 Personal history of antineoplastic chemotherapy: Secondary | ICD-10-CM | POA: Diagnosis not present

## 2016-02-12 DIAGNOSIS — E119 Type 2 diabetes mellitus without complications: Secondary | ICD-10-CM | POA: Diagnosis not present

## 2016-02-12 DIAGNOSIS — Z87891 Personal history of nicotine dependence: Secondary | ICD-10-CM | POA: Diagnosis not present

## 2016-02-12 DIAGNOSIS — I2510 Atherosclerotic heart disease of native coronary artery without angina pectoris: Secondary | ICD-10-CM | POA: Diagnosis not present

## 2016-02-12 DIAGNOSIS — D469 Myelodysplastic syndrome, unspecified: Secondary | ICD-10-CM | POA: Diagnosis not present

## 2016-02-12 DIAGNOSIS — J90 Pleural effusion, not elsewhere classified: Secondary | ICD-10-CM | POA: Diagnosis not present

## 2016-02-12 DIAGNOSIS — Z8546 Personal history of malignant neoplasm of prostate: Secondary | ICD-10-CM | POA: Diagnosis not present

## 2016-02-12 DIAGNOSIS — I10 Essential (primary) hypertension: Secondary | ICD-10-CM | POA: Diagnosis not present

## 2016-02-12 DIAGNOSIS — Z95828 Presence of other vascular implants and grafts: Secondary | ICD-10-CM | POA: Diagnosis not present

## 2016-02-12 DIAGNOSIS — Z794 Long term (current) use of insulin: Secondary | ICD-10-CM | POA: Diagnosis not present

## 2016-02-16 DIAGNOSIS — D469 Myelodysplastic syndrome, unspecified: Secondary | ICD-10-CM | POA: Diagnosis not present

## 2016-02-16 DIAGNOSIS — D696 Thrombocytopenia, unspecified: Secondary | ICD-10-CM | POA: Diagnosis not present

## 2016-02-16 DIAGNOSIS — J90 Pleural effusion, not elsewhere classified: Secondary | ICD-10-CM | POA: Diagnosis not present

## 2016-02-16 DIAGNOSIS — I10 Essential (primary) hypertension: Secondary | ICD-10-CM | POA: Diagnosis not present

## 2016-02-16 DIAGNOSIS — Z4682 Encounter for fitting and adjustment of non-vascular catheter: Secondary | ICD-10-CM | POA: Diagnosis not present

## 2016-02-16 DIAGNOSIS — E119 Type 2 diabetes mellitus without complications: Secondary | ICD-10-CM | POA: Diagnosis not present

## 2016-02-17 ENCOUNTER — Emergency Department (HOSPITAL_COMMUNITY): Payer: Medicare Other

## 2016-02-17 ENCOUNTER — Encounter (HOSPITAL_COMMUNITY): Payer: Self-pay

## 2016-02-17 ENCOUNTER — Encounter: Payer: Self-pay | Admitting: Family Medicine

## 2016-02-17 ENCOUNTER — Inpatient Hospital Stay (HOSPITAL_COMMUNITY)
Admission: EM | Admit: 2016-02-17 | Discharge: 2016-02-21 | DRG: 872 | Disposition: A | Discharge status: Home Health | Payer: Medicare Other | Attending: Internal Medicine | Admitting: Internal Medicine

## 2016-02-17 ENCOUNTER — Ambulatory Visit (INDEPENDENT_AMBULATORY_CARE_PROVIDER_SITE_OTHER): Payer: Medicare Other | Admitting: Family Medicine

## 2016-02-17 ENCOUNTER — Other Ambulatory Visit: Payer: Self-pay

## 2016-02-17 VITALS — BP 140/68 | HR 62 | Temp 98.9°F | Resp 14 | Ht 66.0 in | Wt 188.0 lb

## 2016-02-17 DIAGNOSIS — Z8572 Personal history of non-Hodgkin lymphomas: Secondary | ICD-10-CM | POA: Diagnosis not present

## 2016-02-17 DIAGNOSIS — Z955 Presence of coronary angioplasty implant and graft: Secondary | ICD-10-CM | POA: Diagnosis not present

## 2016-02-17 DIAGNOSIS — J948 Other specified pleural conditions: Secondary | ICD-10-CM

## 2016-02-17 DIAGNOSIS — H6592 Unspecified nonsuppurative otitis media, left ear: Secondary | ICD-10-CM

## 2016-02-17 DIAGNOSIS — I509 Heart failure, unspecified: Secondary | ICD-10-CM | POA: Diagnosis present

## 2016-02-17 DIAGNOSIS — I69351 Hemiplegia and hemiparesis following cerebral infarction affecting right dominant side: Secondary | ICD-10-CM

## 2016-02-17 DIAGNOSIS — Z87891 Personal history of nicotine dependence: Secondary | ICD-10-CM | POA: Diagnosis not present

## 2016-02-17 DIAGNOSIS — R509 Fever, unspecified: Secondary | ICD-10-CM

## 2016-02-17 DIAGNOSIS — Z8546 Personal history of malignant neoplasm of prostate: Secondary | ICD-10-CM

## 2016-02-17 DIAGNOSIS — E785 Hyperlipidemia, unspecified: Secondary | ICD-10-CM | POA: Diagnosis present

## 2016-02-17 DIAGNOSIS — D469 Myelodysplastic syndrome, unspecified: Secondary | ICD-10-CM | POA: Diagnosis not present

## 2016-02-17 DIAGNOSIS — E1122 Type 2 diabetes mellitus with diabetic chronic kidney disease: Secondary | ICD-10-CM | POA: Diagnosis present

## 2016-02-17 DIAGNOSIS — H65192 Other acute nonsuppurative otitis media, left ear: Secondary | ICD-10-CM | POA: Diagnosis not present

## 2016-02-17 DIAGNOSIS — I482 Chronic atrial fibrillation: Secondary | ICD-10-CM | POA: Diagnosis present

## 2016-02-17 DIAGNOSIS — R0602 Shortness of breath: Secondary | ICD-10-CM

## 2016-02-17 DIAGNOSIS — E1151 Type 2 diabetes mellitus with diabetic peripheral angiopathy without gangrene: Secondary | ICD-10-CM | POA: Diagnosis present

## 2016-02-17 DIAGNOSIS — D696 Thrombocytopenia, unspecified: Secondary | ICD-10-CM | POA: Diagnosis present

## 2016-02-17 DIAGNOSIS — J90 Pleural effusion, not elsewhere classified: Secondary | ICD-10-CM | POA: Diagnosis not present

## 2016-02-17 DIAGNOSIS — I493 Ventricular premature depolarization: Secondary | ICD-10-CM | POA: Diagnosis present

## 2016-02-17 DIAGNOSIS — R000 Tachycardia, unspecified: Secondary | ICD-10-CM | POA: Diagnosis not present

## 2016-02-17 DIAGNOSIS — R6883 Chills (without fever): Secondary | ICD-10-CM

## 2016-02-17 DIAGNOSIS — N183 Chronic kidney disease, stage 3 (moderate): Secondary | ICD-10-CM | POA: Diagnosis not present

## 2016-02-17 DIAGNOSIS — H7090 Unspecified mastoiditis, unspecified ear: Secondary | ICD-10-CM | POA: Diagnosis present

## 2016-02-17 DIAGNOSIS — I25810 Atherosclerosis of coronary artery bypass graft(s) without angina pectoris: Secondary | ICD-10-CM

## 2016-02-17 DIAGNOSIS — Z794 Long term (current) use of insulin: Secondary | ICD-10-CM | POA: Diagnosis not present

## 2016-02-17 DIAGNOSIS — G4489 Other headache syndrome: Secondary | ICD-10-CM | POA: Diagnosis not present

## 2016-02-17 DIAGNOSIS — Z7902 Long term (current) use of antithrombotics/antiplatelets: Secondary | ICD-10-CM | POA: Diagnosis not present

## 2016-02-17 DIAGNOSIS — A419 Sepsis, unspecified organism: Secondary | ICD-10-CM | POA: Diagnosis not present

## 2016-02-17 DIAGNOSIS — E119 Type 2 diabetes mellitus without complications: Secondary | ICD-10-CM | POA: Diagnosis not present

## 2016-02-17 DIAGNOSIS — I2510 Atherosclerotic heart disease of native coronary artery without angina pectoris: Secondary | ICD-10-CM | POA: Diagnosis not present

## 2016-02-17 DIAGNOSIS — G4440 Drug-induced headache, not elsewhere classified, not intractable: Secondary | ICD-10-CM | POA: Diagnosis not present

## 2016-02-17 DIAGNOSIS — R05 Cough: Secondary | ICD-10-CM | POA: Diagnosis not present

## 2016-02-17 DIAGNOSIS — I130 Hypertensive heart and chronic kidney disease with heart failure and stage 1 through stage 4 chronic kidney disease, or unspecified chronic kidney disease: Secondary | ICD-10-CM | POA: Diagnosis present

## 2016-02-17 DIAGNOSIS — I10 Essential (primary) hypertension: Secondary | ICD-10-CM | POA: Diagnosis not present

## 2016-02-17 DIAGNOSIS — Z4682 Encounter for fitting and adjustment of non-vascular catheter: Secondary | ICD-10-CM | POA: Diagnosis not present

## 2016-02-17 DIAGNOSIS — H269 Unspecified cataract: Secondary | ICD-10-CM | POA: Diagnosis present

## 2016-02-17 DIAGNOSIS — R51 Headache: Secondary | ICD-10-CM | POA: Diagnosis not present

## 2016-02-17 DIAGNOSIS — E871 Hypo-osmolality and hyponatremia: Secondary | ICD-10-CM | POA: Diagnosis present

## 2016-02-17 LAB — URINALYSIS, ROUTINE W REFLEX MICROSCOPIC
Bilirubin Urine: NEGATIVE
GLUCOSE, UA: NEGATIVE mg/dL
Ketones, ur: NEGATIVE mg/dL
Leukocytes, UA: NEGATIVE
Nitrite: NEGATIVE
PH: 5 (ref 5.0–8.0)
Protein, ur: 100 mg/dL — AB
SPECIFIC GRAVITY, URINE: 1.03 (ref 1.005–1.030)

## 2016-02-17 LAB — URINE MICROSCOPIC-ADD ON

## 2016-02-17 LAB — COMPREHENSIVE METABOLIC PANEL
ALBUMIN: 2.8 g/dL — AB (ref 3.5–5.0)
ALK PHOS: 63 U/L (ref 38–126)
ALT: 20 U/L (ref 17–63)
AST: 22 U/L (ref 15–41)
Anion gap: 8 (ref 5–15)
BILIRUBIN TOTAL: 0.2 mg/dL — AB (ref 0.3–1.2)
BUN: 20 mg/dL (ref 6–20)
CALCIUM: 8.3 mg/dL — AB (ref 8.9–10.3)
CO2: 25 mmol/L (ref 22–32)
CREATININE: 1.7 mg/dL — AB (ref 0.61–1.24)
Chloride: 99 mmol/L — ABNORMAL LOW (ref 101–111)
GFR calc Af Amer: 41 mL/min — ABNORMAL LOW (ref >60)
GFR calc non Af Amer: 35 mL/min — ABNORMAL LOW (ref >60)
GLUCOSE: 143 mg/dL — AB (ref 65–99)
Potassium: 3.9 mmol/L (ref 3.5–5.1)
SODIUM: 132 mmol/L — AB (ref 135–145)
TOTAL PROTEIN: 8.2 g/dL — AB (ref 6.5–8.1)

## 2016-02-17 LAB — CBC WITH DIFFERENTIAL/PLATELET
BASOS PCT: 0 %
Basophils Absolute: 0 10*3/uL (ref 0.0–0.1)
EOS PCT: 0 %
Eosinophils Absolute: 0 10*3/uL (ref 0.0–0.7)
HEMATOCRIT: 38.5 % — AB (ref 39.0–52.0)
HEMOGLOBIN: 11.5 g/dL — AB (ref 13.0–17.0)
LYMPHS PCT: 17 %
Lymphs Abs: 1.6 10*3/uL (ref 0.7–4.0)
MCH: 23.9 pg — AB (ref 26.0–34.0)
MCHC: 29.9 g/dL — ABNORMAL LOW (ref 30.0–36.0)
MCV: 79.9 fL (ref 78.0–100.0)
Monocytes Absolute: 2.1 10*3/uL — ABNORMAL HIGH (ref 0.1–1.0)
Monocytes Relative: 23 %
NEUTROS ABS: 5.5 10*3/uL (ref 1.7–7.7)
Neutrophils Relative %: 60 %
Platelets: 45 10*3/uL — ABNORMAL LOW (ref 150–400)
RBC: 4.82 MIL/uL (ref 4.22–5.81)
RDW: 23.9 % — ABNORMAL HIGH (ref 11.5–15.5)
WBC: 9.2 10*3/uL (ref 4.0–10.5)

## 2016-02-17 LAB — MAGNESIUM: Magnesium: 1.7 mg/dL (ref 1.7–2.4)

## 2016-02-17 LAB — GLUCOSE, CAPILLARY: GLUCOSE-CAPILLARY: 149 mg/dL — AB (ref 65–99)

## 2016-02-17 LAB — I-STAT CG4 LACTIC ACID, ED
LACTIC ACID, VENOUS: 1.65 mmol/L (ref 0.5–1.9)
Lactic Acid, Venous: 1.69 mmol/L (ref 0.5–1.9)

## 2016-02-17 LAB — TROPONIN I: Troponin I: 0.03 ng/mL (ref <0.03)

## 2016-02-17 LAB — I-STAT BETA HCG BLOOD, ED (MC, WL, AP ONLY): I-stat hCG, quantitative: <5 m[IU]/mL (ref <5)

## 2016-02-17 LAB — BRAIN NATRIURETIC PEPTIDE: B Natriuretic Peptide: 486.3 pg/mL — ABNORMAL HIGH (ref 0.0–100.0)

## 2016-02-17 MED ORDER — MECLIZINE HCL 25 MG PO TABS: 12.5000 mg | ORAL_TABLET | Freq: Three times a day (TID) | ORAL | Status: DC | PRN

## 2016-02-17 MED ORDER — CLOPIDOGREL BISULFATE 75 MG PO TABS
75.0000 mg | ORAL_TABLET | Freq: Every day | ORAL | Status: DC
  Administered 2016-02-18 – 2016-02-21 (×4): 75 mg via ORAL
  Filled 2016-02-17 (×3): qty 1

## 2016-02-17 MED ORDER — SODIUM CHLORIDE 0.9 % IV BOLUS (SEPSIS): 1000.0000 mL | Freq: Once | INTRAVENOUS | Status: DC

## 2016-02-17 MED ORDER — ONDANSETRON HCL 4 MG PO TABS: 4.0000 mg | ORAL_TABLET | Freq: Four times a day (QID) | ORAL | Status: DC | PRN

## 2016-02-17 MED ORDER — HYDROCODONE-ACETAMINOPHEN 5-325 MG PO TABS: 1.0000 | ORAL_TABLET | Freq: Four times a day (QID) | ORAL | Status: DC | PRN

## 2016-02-17 MED ORDER — METOCLOPRAMIDE HCL 5 MG PO TABS
5.0000 mg | ORAL_TABLET | Freq: Three times a day (TID) | ORAL | Status: DC
  Administered 2016-02-18 – 2016-02-21 (×11): 5 mg via ORAL
  Filled 2016-02-17 (×11): qty 1

## 2016-02-17 MED ORDER — SODIUM CHLORIDE 0.9 % IV BOLUS (SEPSIS)
1000.0000 mL | Freq: Once | INTRAVENOUS | Status: AC
  Administered 2016-02-17: 1000 mL via INTRAVENOUS

## 2016-02-17 MED ORDER — ACETAMINOPHEN 500 MG PO TABS: 500.0000 mg | ORAL_TABLET | ORAL | Status: DC | PRN

## 2016-02-17 MED ORDER — ALBUTEROL SULFATE (2.5 MG/3ML) 0.083% IN NEBU: 2.5000 mg | INHALATION_SOLUTION | RESPIRATORY_TRACT | Status: DC | PRN

## 2016-02-17 MED ORDER — VANCOMYCIN HCL IN DEXTROSE 1-5 GM/200ML-% IV SOLN
1000.0000 mg | Freq: Once | INTRAVENOUS | Status: AC
  Administered 2016-02-17: 1000 mg via INTRAVENOUS
  Filled 2016-02-17: qty 200

## 2016-02-17 MED ORDER — PIPERACILLIN-TAZOBACTAM 3.375 G IVPB
3.3750 g | Freq: Three times a day (TID) | INTRAVENOUS | Status: DC
  Filled 2016-02-17: qty 50

## 2016-02-17 MED ORDER — PIPERACILLIN-TAZOBACTAM 3.375 G IVPB 30 MIN
3.3750 g | Freq: Once | INTRAVENOUS | Status: AC
  Administered 2016-02-17: 3.375 g via INTRAVENOUS
  Filled 2016-02-17: qty 50

## 2016-02-17 MED ORDER — ACETAMINOPHEN 325 MG PO TABS
650.0000 mg | ORAL_TABLET | Freq: Four times a day (QID) | ORAL | Status: DC | PRN
  Administered 2016-02-18 – 2016-02-21 (×8): 650 mg via ORAL
  Filled 2016-02-17 (×6): qty 2

## 2016-02-17 MED ORDER — SODIUM CHLORIDE 0.9 % IV SOLN
2.0000 g | Freq: Four times a day (QID) | INTRAVENOUS | Status: DC
  Administered 2016-02-18 – 2016-02-19 (×7): 2 g via INTRAVENOUS
  Filled 2016-02-17 (×8): qty 2000

## 2016-02-17 MED ORDER — IPRATROPIUM BROMIDE 0.02 % IN SOLN: 0.5000 mg | RESPIRATORY_TRACT | Status: DC | PRN

## 2016-02-17 MED ORDER — FERROUS SULFATE 325 (65 FE) MG PO TABS
325.0000 mg | ORAL_TABLET | Freq: Every day | ORAL | Status: DC
  Administered 2016-02-18 – 2016-02-20 (×3): 325 mg via ORAL
  Filled 2016-02-17 (×3): qty 1

## 2016-02-17 MED ORDER — INSULIN GLARGINE 100 UNIT/ML ~~LOC~~ SOLN
5.0000 [IU] | Freq: Every day | SUBCUTANEOUS | Status: DC
  Administered 2016-02-18 – 2016-02-20 (×3): 5 [IU] via SUBCUTANEOUS
  Filled 2016-02-17 (×4): qty 0.05

## 2016-02-17 MED ORDER — DEXTROSE 5 % IV SOLN
2.0000 g | Freq: Two times a day (BID) | INTRAVENOUS | Status: DC
  Administered 2016-02-18 – 2016-02-19 (×4): 2 g via INTRAVENOUS
  Filled 2016-02-17 (×5): qty 2

## 2016-02-17 MED ORDER — IRBESARTAN 300 MG PO TABS
150.0000 mg | ORAL_TABLET | Freq: Every day | ORAL | Status: DC
  Administered 2016-02-18 – 2016-02-21 (×4): 150 mg via ORAL
  Filled 2016-02-17 (×5): qty 1

## 2016-02-17 MED ORDER — ELTROMBOPAG OLAMINE 50 MG PO TABS
50.0000 mg | ORAL_TABLET | Freq: Every day | ORAL | Status: DC
  Administered 2016-02-18 – 2016-02-21 (×4): 50 mg via ORAL
  Filled 2016-02-17 (×4): qty 1

## 2016-02-17 MED ORDER — DOXAZOSIN MESYLATE 8 MG PO TABS
4.0000 mg | ORAL_TABLET | Freq: Every day | ORAL | Status: DC
  Administered 2016-02-18 – 2016-02-21 (×4): 4 mg via ORAL
  Filled 2016-02-17 (×4): qty 1

## 2016-02-17 MED ORDER — ACETAMINOPHEN 650 MG RE SUPP: 650.0000 mg | Freq: Four times a day (QID) | RECTAL | Status: DC | PRN

## 2016-02-17 MED ORDER — ADULT MULTIVITAMIN W/MINERALS CH
1.0000 | ORAL_TABLET | Freq: Every day | ORAL | Status: DC
  Administered 2016-02-18 – 2016-02-21 (×4): 1 via ORAL
  Filled 2016-02-17 (×4): qty 1

## 2016-02-17 MED ORDER — METOPROLOL SUCCINATE ER 100 MG PO TB24
100.0000 mg | ORAL_TABLET | Freq: Every day | ORAL | Status: DC
  Administered 2016-02-18 – 2016-02-21 (×4): 100 mg via ORAL
  Filled 2016-02-17 (×4): qty 1

## 2016-02-17 MED ORDER — POTASSIUM CHLORIDE CRYS ER 20 MEQ PO TBCR
20.0000 meq | EXTENDED_RELEASE_TABLET | Freq: Two times a day (BID) | ORAL | Status: DC
  Administered 2016-02-18 – 2016-02-21 (×8): 20 meq via ORAL
  Filled 2016-02-17 (×8): qty 1

## 2016-02-17 MED ORDER — ONDANSETRON HCL 4 MG/2ML IJ SOLN: 4.0000 mg | Freq: Four times a day (QID) | INTRAMUSCULAR | Status: DC | PRN

## 2016-02-17 MED ORDER — VANCOMYCIN HCL 10 G IV SOLR
1250.0000 mg | INTRAVENOUS | Status: DC
  Filled 2016-02-17: qty 1250

## 2016-02-17 MED ORDER — INSULIN ASPART 100 UNIT/ML ~~LOC~~ SOLN
0.0000 [IU] | Freq: Three times a day (TID) | SUBCUTANEOUS | Status: DC
  Administered 2016-02-19: 3 [IU] via SUBCUTANEOUS
  Administered 2016-02-19: 2 [IU] via SUBCUTANEOUS
  Administered 2016-02-20: 1 [IU] via SUBCUTANEOUS
  Administered 2016-02-20: 2 [IU] via SUBCUTANEOUS
  Administered 2016-02-20 – 2016-02-21 (×2): 1 [IU] via SUBCUTANEOUS
  Administered 2016-02-21: 2 [IU] via SUBCUTANEOUS

## 2016-02-17 NOTE — H&P (Signed)
History and Physical    Ricardo Watkins DOB: Feb 08, 1931 DOA: 02/17/2016  Referring MD/NP/PA:  PCP: Ricardo Fraction, MD  Patient coming from: Home  Chief Complaint: Fever, headache, and generalized weakness  HPI: Ricardo Watkins is a 82 y.o. male with medical history significant of DM, CAD s/p PCI, HTN, DVT, non-Hodgkin's lymphoma,  thrombocytopenia recurrent pleural effusions s/p Pleurx catheter who presents with complaints of headache, fever, and fatigue over the last 5 days. It initially started with complaints of nausea and vomiting over the first 2 days. Then progressed and the patient began breaking out into sweats and started to complain of global headache. Family recorded temperatures of 99-100F. He had associated symptoms of photophobia, decreased appetite, unknown weight loss, generalized weakness, ear discomfort, and reports of mild confusion. They had tried utilizing Tylenol and increasing fluids per the home health nurse recommendations without relief of symptoms. Family decided to bring him into the hospital today as it did not appear that he was improving and he seemed to be getting worse. Patient had just recently had a Pleurx catheter placed by Ricardo Watkins on 8/22. During the procedure 2.2 L of fluid were drained from the left pleural space. Family notes that they were instructed to train. Catheter 3 times per week but no more than 1 L that time. This was last performed yesterday. It was noted that he had a regular heart rhythm yesterday, but they have never been told that previously in the past to their knowledge. He denies any chest pain, diarrhea, hemoptysis, rash, or focal weakness.   ED Course: Upon admission to the emergency department patient was evaluated and seeen to be febrile up to 100.26F, pulse up to 105, respirations up to 29, and blood pressure O2 saturations maintained. Lab work revealed WBC of 9.2, hemoglobin 11 5, platelets 45, sodium 132,  potassium 3.9, chloride 99, CO2 25, BUN 20, creatinine 1.7, and lactic acid 1.69. Code sepsis initiated without full 30 ml/jg fluid bolus and  started on empiric antibiotics of vancomycin and Zosyn. TRH called to admit.  Review of Systems: As per HPI otherwise 10 point review of systems negative.   Past Medical History:  Diagnosis Date  . Adenomatous colon polyp   . CAD (coronary artery disease)    30% LAD Stenosis, 70% ramus intermedius stenosis, treated with PTCA and angioplasty by Ricardo Ricardo Watkins 2004  . Cataract    right eye  . Cough, persistent 11/04/2015  . Deficiency anemia 04/19/2014  . Diverticulosis   . DM (diabetes mellitus) (Ricardo Watkins)   . DVT (deep venous thrombosis) ([place])    secondary to surgery  . Dyslipidemia   . Headache    occ  . Hyperlipidemia   . Hypertension   . Inguinal hernia    right  . 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 Microcytic anemia 07/07/2015  . 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 Non Hodgkin's lymphoma ([place])   . Pleural effusion, left 11/04/2015  . Pneumonia   . Prostate CA ([place]) 09/10/2011   Gleason 3+3 R, 3+4 L lobe May 2007 Rx Radioactive seed implants Ricardo Watkins  . Prostate cancer ([place])    seed implants no recurrence  . PVD (peripheral vascular disease) (Ricardo Watkins)    rt renal artery stent  . Renal insufficiency   . Shortness of breath dyspnea   . Thrombocytopenia (Ricardo Watkins)   . Thrombotic stroke [facility]) 09/10/2011   January 18, 2011  infarct genu & post limb R internal capsule - acute; previous lacunar infarcts/extensive white matter dis  . Vitamin D deficiency     Past Surgical History:  Procedure Laterality Date  . APPENDECTOMY     patient ?  Marland Kitchen CARDIAC CATHETERIZATION    . CHEST TUBE INSERTION Left 02/10/2016   Procedure: INSERTION PLEURAL DRAINAGE CATHETER;  Surgeon: Ricardo Poot, MD;  Location: [facility];  Service: Thoracic;  Laterality: Left;  . COLONOSCOPY    . CORONARY ANGIOPLASTY    . CYSTOURETHROSCOPY      ROBOTIC ARM NUCLETRON SEED IMPLANTATION OF PROSTATE  . EXPLORATORY LAPAROTOMY     For evaluation of lymphoma     reports that he quit smoking about 57 years ago. His smoking use included Pipe and Cigarettes. He has a 10.00 pack-year smoking history. He has never used smokeless tobacco. He reports that he does not drink alcohol or use drugs.  Allergies  Allergen Reactions  . Glucophage [Metformin Hydrochloride] Other (See Comments)    Chest pain  . Zetia [Ezetimibe] Other (See Comments)    weakness  . Fenofibrate Rash  . Niacin-Lovastatin Er Rash    Family History  Problem Relation Age of Onset  . Lymphoma Sister   . Prostate cancer Brother   . Breast cancer Sister   . Diabetes Brother   . Diabetes Sister   . Heart disease Brother   . Asthma Son     Prior to Admission medications   Medication Sig Start Date End Date Taking? Authorizing Provider  acetaminophen (TYLENOL) 500 MG tablet Take 500 mg by mouth every 6 (six) hours as needed for pain.    Historical Provider, MD  clopidogrel (PLAVIX) 75 MG tablet TAKE 1 TABLET BY MOUTH DAILY WITH BREAKFAST. 09/01/15   Ricardo Frizzle, MD  doxazosin (CARDURA) 4 MG tablet TAKE 1 TABLET BY MOUTH ONCE A DAY 02/09/16   Ricardo Frizzle, MD  eltrombopag (PROMACTA) 50 MG tablet Take 1 tablet (50 mg total) by mouth daily. Take on an empty stomach 1 hour before a meal or 2 hours after 10/20/15   Ricardo Lark, MD  ferrous sulfate 325 (65 FE) MG tablet Take 325 mg by mouth daily with breakfast.    Historical Provider, MD  glucose blood test strip 1 each by Other route daily. Fasting each morning 10/21/15   Ricardo Frizzle, MD  HYDROcodone-acetaminophen (NORCO) 5-325 MG tablet Take 1 tablet by mouth every 6 (six) hours as needed for moderate pain. 06/27/15   Ricardo Frizzle, MD  IRON PO Take 1 tablet by mouth daily.    Historical Provider, MD  LANTUS SOLOSTAR 100 UNIT/ML Solostar Pen INJECT 25 UNITS INTO THE SKIN AT BEDTIME. Patient taking differently:  INJECT 15 UNITS INTO THE SKIN AT BEDTIME. 03/03/15   Ricardo Frizzle, MD  meclizine (ANTIVERT) 12.5 MG tablet Take 1 tablet (12.5 mg total) by mouth 3 (three) times daily as needed for dizziness. 06/27/15   Ricardo Frizzle, MD  metoCLOPramide (REGLAN) 5 MG tablet Take 1 tablet (5 mg total) by mouth 3 (three) times daily before meals. Patient taking differently: Take 5 mg by mouth 3 (three) times daily as needed.  03/25/15   Ricardo Frizzle, MD  metoprolol succinate (TOPROL-XL) 100 MG 24 hr tablet TAKE 1 TABLET BY MOUTH ONCE A DAY 12/11/15   Ricardo Frizzle, MD  Multiple Vitamins-Minerals (MULTIVITAMIN ADULT PO) Take 1 tablet by mouth daily.    Historical Provider, MD  potassium  chloride SA (K-DUR,KLOR-CON) 20 MEQ tablet Take 20 mEq by mouth 2 (two) times daily.    Historical Provider, MD  valsartan (DIOVAN) 160 MG tablet Take 1 tablet (160 mg total) by mouth daily. 11/14/15   Tanda Rockers, MD    Physical Exam:   Constitutional: NAD, calm, comfortableElderly male who appears in some moderate Distress  Vitals:   02/17/16 1830 02/17/16 1845 02/17/16 1915 02/17/16 1930  BP: 131/68 (!) 136/49 134/75 140/67  Pulse:  102  104  Resp:    23  Temp:    99.7 F (37.6 C)  TempSrc:    Oral  SpO2: 94% 94% 96% 96%  Weight:      Height:       Eyes: PERRL, lids and conjunctivae normal ENMT: Mucous membranes are dry. Posterior pharynx clear of any exudate or lesions. Poor dentition. Pus drainage noted behind left and right tympanic membrane with sensitivity most notably on right with introduction of speculum. Neck: normal, supple, no masses, no thyromegaly Respiratory:decreased overall air movement bilaterally, no wheezing, no crackles. Normal respiratory effort. No accessory muscle use. Left-sided Pleurx catheter in place with less than 1 cm area  mild erythema noted superior Cardiovascular: Irregularly heart rhythm, no murmurs / rubs / gallops.+1 pitting lower extremity edema  edema. 2+ pedal pulses. No  carotid bruits.  Abdomen: no tenderness, no masses palpated. No hepatosplenomegaly. Bowel sounds positive.  Musculoskeletal: no clubbing / cyanosis. No joint deformity upper and lower extremities. Good ROM, no contractures. Normal muscle tone.  Skin: no other rashes, lesions, ulcers besides what is noted above. No induration Neurologic: CN 2-12 grossly intact. Sensation intact, DTR normal. Strength 5/5 in all 4.  Psychiatric: Normal judgment and insight. Alert and oriented x 3. Normal mood.     Labs on Admission: I have personally reviewed following labs and imaging studies  CBC:  Recent Labs Lab 02/17/16 1722  WBC 9.2  NEUTROABS 5.5  HGB 11.5*  HCT 38.5*  MCV 79.9  PLT 45*   Basic Metabolic Panel:  Recent Labs Lab 02/17/16 1722  NA 132*  K 3.9  CL 99*  CO2 25  GLUCOSE 143*  BUN 20  CREATININE 1.70*  CALCIUM 8.3*   GFR: Estimated Creatinine Clearance: 33.7 mL/min (by C-G formula based on SCr of 1.7 mg/dL). Liver Function Tests:  Recent Labs Lab 02/17/16 1722  AST 22  ALT 20  ALKPHOS 63  BILITOT 0.2*  PROT 8.2*  ALBUMIN 2.8*   No results for input(s): LIPASE, AMYLASE in the last 168 hours. No results for input(s): AMMONIA in the last 168 hours. Coagulation Profile: No results for input(s): INR, PROTIME in the last 168 hours. Cardiac Enzymes: No results for input(s): CKTOTAL, CKMB, CKMBINDEX, TROPONINI in the last 168 hours. BNP (last 3 results)  Recent Labs  01/27/16 1119  PROBNP 311.0*   HbA1C: No results for input(s): HGBA1C in the last 72 hours. CBG: No results for input(s): GLUCAP in the last 168 hours. Lipid Profile: No results for input(s): CHOL, HDL, LDLCALC, TRIG, CHOLHDL, LDLDIRECT in the last 72 hours. Thyroid Function Tests: No results for input(s): TSH, T4TOTAL, FREET4, T3FREE, THYROIDAB in the last 72 hours. Anemia Panel: No results for input(s): VITAMINB12, FOLATE, FERRITIN, TIBC, IRON, RETICCTPCT in the last 72 hours. Urine  analysis:    Component Value Date/Time   COLORURINE YELLOW 02/17/2016 1819   APPEARANCEUR HAZY (A) 02/17/2016 1819   LABSPEC 1.030 02/17/2016 1819   PHURINE 5.0 02/17/2016 1819   GLUCOSEU NEGATIVE  02/17/2016 1819   HGBUR TRACE (A) 02/17/2016 1819   BILIRUBINUR NEGATIVE 02/17/2016 1819   KETONESUR NEGATIVE 02/17/2016 1819   PROTEINUR 100 (A) 02/17/2016 1819   NITRITE NEGATIVE 02/17/2016 1819   LEUKOCYTESUR NEGATIVE 02/17/2016 1819   Sepsis Labs: No results found for this or any previous visit (from the past 240 hour(s)).   Radiological Exams on Admission: Dg Chest 2 View  Result Date: 02/17/2016 CLINICAL DATA:  Fever, cough, and weakness for 1 week. EXAM: CHEST  2 VIEW COMPARISON:  02/10/2016 FINDINGS: A left chest tube remains in place. Improved left pleural effusion or thickening. Mild atelectasis or infiltration in the left base is also improved. Right lung is clear. Normal heart size and pulmonary vascularity. Calcified and tortuous aorta. No pneumothorax. IMPRESSION: Improved infiltration and effusion in the left base. Left chest tube remains place. Electronically Signed   By: Lucienne Capers M.D.   On: 02/17/2016 18:20   Ct Head Wo Contrast  Result Date: 02/17/2016 CLINICAL DATA:  Headache. EXAM: CT HEAD WITHOUT CONTRAST TECHNIQUE: Contiguous axial images were obtained from the base of the skull through the vertex without intravenous contrast. COMPARISON:  10/18/2014. FINDINGS: Brain: There is no evidence for acute hemorrhage, hydrocephalus, mass lesion, or abnormal extra-axial fluid collection. No definite CT evidence for acute infarction. Diffuse loss of parenchymal volume is consistent with atrophy. Encephalomalacia right occipital lobe is is again noted consistent with prior infarct. Lacunar infarcts are noted in the right basal ganglia. Vascular: Atherosclerotic calcification is visualized in the carotid arteries. No dense MCA sign. Major dural sinuses are unremarkable. Skull: No  evidence for skull fracture Sinuses/Orbits: Visualized paranasal sinuses are clear. Right mastoid air cells are clear. The patient has developed a left mastoid effusion in the interval since the prior study with fluid visible in the left middle ear. Other: Unremarkable. IMPRESSION: 1. Interval development of left mastoid effusion with fluid visible in the left middle ear. 2. No acute intracranial abnormality. 3. Atrophy with chronic small vessel white matter ischemic disease and old right PCA territory infarct. Electronically Signed   By: Misty Stanley M.D.   On: 02/17/2016 19:13    EKG: Independently reviewed. Atrial fibrillation  Assessment/Plan Sepsis with unknown source: Acute. Patient presents with headache, mild confusion per family, fever up to 100.7, tachycardia, and tachypnea with recent Pleurx catheter placed on 8/22. Patient was started on vancomycin and Zosyn in the emergency department and blood cultures were obtained. Patient was not given full fluid bolus secondary to CHF history. Currently on differential includes infection related to Pleurx catheter versus possibility of meningitis. - Admit to telemetry bed - Sepsis protocol initiated - Follow-up blood and Pleurx catheter fluid cultures - changed empiric antibiotics to vancomycin, cefepime, and ampicillin Possibility of meningitis - Neuro checks q 4h   Sinus tachycardia with frequent PVCs  - Check magnesium and replace as needed  - Trend cardiac enzymes   CHF history  - Recheck BNP - strict in and outs   Chronic A. fib. Not on anticoagulation - Currently rate controlled continue to monitor   CAD - continue Plavix  Diabetes mellitus type 2: Patient currently is on Lantus. - Hypoglycemic protocols - CBG q AC with sensitive sliding-scale insulin  - lantus 5 units subcutaneously q HS  Chronic kidney disease stage III : Slightly worse than baseline of 1.4-1.5.  -Gentle IV fluid hydration and repeat BMP  In a.m.    Essential hypertension  - continue doxazosin, Toprol XL, and pharmacy substitution of  Irbesartan for valsartan   Hyponatremia - continue to monitor  MDS/Thrombocytopenia/Anemia - Continue Promacta   DVT prophylaxis: SCDs Code Status: Full Family Communication: discussed case with patient's wife and daughter present at bedside Disposition Plan:TBD Consults called: none Admission status:  Inpatient  Norval Morton MD [facility] Pager [phone]  If 7PM-7AM, please contact night-coverage [email or web address] Password [facility]  02/17/2016, 7:59 PM

## 2016-02-17 NOTE — ED Provider Notes (Signed)
[facility] DEPT Provider Note   CSN: [id number] Arrival date & time: 02/17/16  1701     History   Chief Complaint Chief Complaint  Patient presents with  . Fever  . Headache    HPI MONTERRIUS HAFLER is a 82 y.o. male.  Patient with a history of diabetes, CHF, prior DVT and non-Hodgkin's lymphoma presents with fever. He recently had a pleural catheter placed last week by Dr. Lucianne Lei trite for recurrent pleural effusions. He states that for the last 2 days she's been running fevers that it been around 100. He's had chills and generalized weakness. He does reports a headache which is to the bifrontal area, more on the left side. He has a history of headaches. He states today's headache feels similar to his past headaches. The only difference as it normally resolves with Tylenol and this time it did not. He denies any neck pain. He had 2 episodes of vomiting 3 days ago but no vomiting since that time. He doesn't report any increase cough or chest pain. He has a chronic cough related to the pleural effusion which is unchanged. His family states he has occasional confusion which is baseline for him and unchanged from his baseline. He denies any urinary symptoms. No abdominal pain. No change in bowel habits. They have been draining the pleural fluid. The drain it twice since he had the surgery done. The family states it's been a little bit bloody but not cloudy. He was seen by his PCP and sent here for further evaluation.    Fever   Associated symptoms include headaches and cough. Pertinent negatives include no chest pain, no diarrhea, no vomiting and no congestion.  Headache   Associated symptoms include a fever. Pertinent negatives include no shortness of breath, no nausea and no vomiting.    Past Medical History:  Diagnosis Date  . Adenomatous colon polyp   . CAD (coronary artery disease)    30% LAD Stenosis, 70% ramus intermedius stenosis, treated with PTCA and angioplasty by Dr Albertine Patricia  2004  . Cataract    right eye  . Cough, persistent 11/04/2015  . Deficiency anemia 04/19/2014  . Diverticulosis   . DM (diabetes mellitus) ([place])   . DVT (deep venous thrombosis) ([place])    secondary to surgery  . Dyslipidemia   . Headache    occ  . Hyperlipidemia   . Hypertension   . Inguinal hernia    right  . 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 Microcytic anemia 07/07/2015  . 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 Non Hodgkin's lymphoma ([place])   . Pleural effusion, left 11/04/2015  . Pneumonia   . Prostate CA ([place]) 09/10/2011   Gleason 3+3 R, 3+4 L lobe May 2007 Rx Radioactive seed implants Dr [name]  . Prostate cancer ([place])    seed implants no recurrence  . PVD (peripheral vascular disease) ([place])    rt renal artery stent  . Renal insufficiency   . Shortness of breath dyspnea   . Thrombocytopenia ([place])   . 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 Vitamin D deficiency     Patient Active Problem List   Diagnosis Date Noted  . Sepsis ([place]) 02/17/2016  . Dyspnea 01/27/2016  . Pulmonary edema with congestive heart failure ([place]) 11/12/2015  . Cough, persistent 11/04/2015  .  Pleural effusion, left 11/04/2015  . Anorexia 08/08/2015  . Microcytic anemia 07/07/2015  . Pancytopenia ([place]) 04/08/2015  . Diarrhea 04/08/2015  . Encounter for chemotherapy management 04/02/2015  . Neutropenia ([place]) 02/18/2015  . 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 Deficiency anemia 04/19/2014  . Thrombocytopenia ([place]) 04/19/2014  . Vitamin B12 deficiency 04/19/2014  . Chronic renal failure, stage 3 (moderate) 04/19/2014  . Macrocytic anemia 03/20/2013  . Monocytosis 03/20/2013  . 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 Prostate CA ([place]) 09/10/2011  . Inguinal hernia   . Colon polyps   . CAD (coronary artery  disease) 08/26/2010  . HTN (hypertension) 08/26/2010  . Murmur 08/26/2010  . MURMUR 08/26/2010  . History of lymphoma 08/25/2010  . DM 08/25/2010  . DYSLIPIDEMIA 08/25/2010  . THROMBOCYTOPENIA 08/25/2010  . Essential hypertension 08/25/2010  . Coronary atherosclerosis 08/25/2010  . PVD 08/25/2010  . RENAL INSUFFICIENCY 08/25/2010    Past Surgical History:  Procedure Laterality Date  . APPENDECTOMY     patient ?  Marland Kitchen CARDIAC CATHETERIZATION    . CHEST TUBE INSERTION Left 02/10/2016   Procedure: INSERTION PLEURAL DRAINAGE CATHETER;  Surgeon: [name], MD;  Location: [facility];  Service: Thoracic;  Laterality: Left;  . COLONOSCOPY    . CORONARY ANGIOPLASTY    . CYSTOURETHROSCOPY     ROBOTIC ARM NUCLETRON SEED IMPLANTATION OF PROSTATE  . EXPLORATORY LAPAROTOMY     For evaluation of lymphoma       Home Medications    Prior to Admission medications   Medication Sig Start Date End Date Taking? Authorizing Provider  acetaminophen (TYLENOL) 500 MG tablet Take 500 mg by mouth every 6 (six) hours as needed for pain.    Historical Provider, MD  clopidogrel (PLAVIX) 75 MG tablet TAKE 1 TABLET BY MOUTH DAILY WITH BREAKFAST. 09/01/15   [name], MD  doxazosin (CARDURA) 4 MG tablet TAKE 1 TABLET BY MOUTH ONCE A DAY 02/09/16   [name], MD  eltrombopag (PROMACTA) 50 MG tablet Take 1 tablet (50 mg total) by mouth daily. Take on an empty stomach 1 hour before a meal or 2 hours after 10/20/15   Heath Lark, MD  ferrous sulfate 325 (65 FE) MG tablet Take 325 mg by mouth daily with breakfast.    Historical Provider, MD  glucose blood test strip 1 each by Other route daily. Fasting each morning 10/21/15   [name], MD  HYDROcodone-acetaminophen (NORCO) 5-325 MG tablet Take 1 tablet by mouth every 6 (six) hours as needed for moderate pain. 06/27/15   [name], MD  IRON PO Take 1 tablet by mouth daily.    Historical Provider, MD  LANTUS SOLOSTAR 100 UNIT/ML Solostar Pen  INJECT 25 UNITS INTO THE SKIN AT BEDTIME. Patient taking differently: INJECT 15 UNITS INTO THE SKIN AT BEDTIME. 03/03/15   [name], MD  meclizine (ANTIVERT) 12.5 MG tablet Take 1 tablet (12.5 mg total) by mouth 3 (three) times daily as needed for dizziness. 06/27/15   [name], MD  metoCLOPramide (REGLAN) 5 MG tablet Take 1 tablet (5 mg total) by mouth 3 (three) times daily before meals. Patient taking differently: Take 5 mg by mouth 3 (three) times daily as needed.  03/25/15   [name], MD  metoprolol succinate (TOPROL-XL) 100 MG 24 hr tablet TAKE 1 TABLET BY MOUTH ONCE A DAY 12/11/15   [name], MD  Multiple Vitamins-Minerals (MULTIVITAMIN ADULT PO) Take 1  tablet by mouth daily.    Historical Provider, MD  potassium chloride SA (K-DUR,KLOR-CON) 20 MEQ tablet Take 20 mEq by mouth 2 (two) times daily.    Historical Provider, MD  valsartan (DIOVAN) 160 MG tablet Take 1 tablet (160 mg total) by mouth daily. 11/14/15   Tanda Rockers, MD    Family History Family History  Problem Relation Age of Onset  . Lymphoma Sister   . Prostate cancer Brother   . Breast cancer Sister   . Diabetes Brother   . Diabetes Sister   . Heart disease Brother   . Asthma Son     Social History Social History  Substance Use Topics  . Smoking status: Former Smoker    Packs/day: 0.50    Years: 20.00    Types: Pipe, Cigarettes    Quit date: 06/21/1958  . Smokeless tobacco: Never Used  . Alcohol use No     Allergies   Glucophage [metformin hydrochloride]; Zetia [ezetimibe]; Fenofibrate; and Niacin-lovastatin er   Review of Systems Review of Systems  Constitutional: Positive for appetite change, chills, fatigue and fever. Negative for diaphoresis.  HENT: Negative for congestion, rhinorrhea and sneezing.   Eyes: Negative.   Respiratory: Positive for cough. Negative for chest tightness and shortness of breath.   Cardiovascular: Negative for chest pain and leg swelling.    Gastrointestinal: Negative for abdominal pain, blood in stool, diarrhea, nausea and vomiting.  Genitourinary: Negative for difficulty urinating, flank pain, frequency and hematuria.  Musculoskeletal: Negative for arthralgias and back pain.  Skin: Negative for rash.  Neurological: Positive for headaches. Negative for dizziness, speech difficulty, weakness and numbness.     Physical Exam Updated Vital Signs BP 140/67   Pulse 104   Temp 99.7 F (37.6 C) (Oral)   Resp 23   Ht 5\' 6"  (1.676 m)   Wt 188 lb (85.3 kg)   SpO2 96%   BMI 30.34 kg/m   Physical Exam  Constitutional: He is oriented to person, place, and time. He appears well-developed and well-nourished.  HENT:  Head: Normocephalic and atraumatic.  Cloudy fluid behind left TM.  Minimal pain to mastoid  Eyes: Pupils are equal, round, and reactive to light.  No photophobia  Neck: Normal range of motion. Neck supple.  No meningismus  Cardiovascular: Normal rate, regular rhythm and normal heart sounds.   Pulmonary/Chest: Effort normal and breath sounds normal. No respiratory distress. He has no wheezes. He has no rales. He exhibits no tenderness.  Pleural catheters and placed in the left chest wall. There is clear fluid in the drain. There is no erythema or drainage around the tube.  Abdominal: Soft. Bowel sounds are normal. There is no tenderness. There is no rebound and no guarding.  Musculoskeletal: Normal range of motion. He exhibits no edema.  Lymphadenopathy:    He has no cervical adenopathy.  Neurological: He is alert and oriented to person, place, and time. He has normal strength. No cranial nerve deficit or sensory deficit.  Skin: Skin is warm and dry. No rash noted.  Psychiatric: He has a normal mood and affect.     ED Treatments / Results  Labs (all labs ordered are listed, but only abnormal results are displayed) Labs Reviewed  COMPREHENSIVE METABOLIC PANEL - Abnormal; Notable for the following:        Result Value   Sodium 132 (*)    Chloride 99 (*)    Glucose, Bld 143 (*)    Creatinine, Ser 1.70 (*)  Calcium 8.3 (*)    Total Protein 8.2 (*)    Albumin 2.8 (*)    Total Bilirubin 0.2 (*)    GFR calc non Af Amer 35 (*)    GFR calc Af Amer 41 (*)    All other components within normal limits  CBC WITH DIFFERENTIAL/PLATELET - Abnormal; Notable for the following:    Hemoglobin 11.5 (*)    HCT 38.5 (*)    MCH 23.9 (*)    MCHC 29.9 (*)    RDW 23.9 (*)    Platelets 45 (*)    Monocytes Absolute 2.1 (*)    All other components within normal limits  URINALYSIS, ROUTINE W REFLEX MICROSCOPIC (NOT AT [facility]) - Abnormal; Notable for the following:    APPearance HAZY (*)    Hgb urine dipstick TRACE (*)    Protein, ur 100 (*)    All other components within normal limits  URINE MICROSCOPIC-ADD ON - Abnormal; Notable for the following:    Squamous Epithelial / LPF 0-5 (*)    Bacteria, UA RARE (*)    Casts GRANULAR CAST (*)    All other components within normal limits  CULTURE, BLOOD (ROUTINE X 2)  URINE CULTURE  CULTURE, BLOOD (ROUTINE X 2) W REFLEX TO ID PANEL  BODY FLUID CULTURE  BODY FLUID CELL COUNT WITH DIFFERENTIAL  I-STAT BETA HCG BLOOD, ED ([facility], [facility], [facility] ONLY)  I-STAT CG4 LACTIC ACID, ED  I-STAT CG4 LACTIC ACID, ED  I-STAT CG4 LACTIC ACID, ED    EKG  EKG Interpretation  Date/Time:  Tuesday February 17 2016 17:03:58 EDT Ventricular Rate:  104 PR Interval:  138 QRS Duration: 82 QT Interval:  342 QTC Calculation: 449 R Axis:   -28 Text Interpretation:  Sinus tachycardia with frequent Premature ventricular complexes Anterior infarct , age undetermined Abnormal ECG no new ischemic changes Confirmed by Deagen Krass  MD, Aariyah Sampey ([id number]) on 02/17/2016 7:25:37 PM       Radiology Dg Chest 2 View  Result Date: 02/17/2016 CLINICAL DATA:  Fever, cough, and weakness for 1 week. EXAM: CHEST  2 VIEW COMPARISON:  02/10/2016 FINDINGS: A left chest tube remains in place. Improved left pleural  effusion or thickening. Mild atelectasis or infiltration in the left base is also improved. Right lung is clear. Normal heart size and pulmonary vascularity. Calcified and tortuous aorta. No pneumothorax. IMPRESSION: Improved infiltration and effusion in the left base. Left chest tube remains place. Electronically Signed   By: Lucienne Capers M.D.   On: 02/17/2016 18:20   Ct Head Wo Contrast  Result Date: 02/17/2016 CLINICAL DATA:  Headache. EXAM: CT HEAD WITHOUT CONTRAST TECHNIQUE: Contiguous axial images were obtained from the base of the skull through the vertex without intravenous contrast. COMPARISON:  10/18/2014. FINDINGS: Brain: There is no evidence for acute hemorrhage, hydrocephalus, mass lesion, or abnormal extra-axial fluid collection. No definite CT evidence for acute infarction. Diffuse loss of parenchymal volume is consistent with atrophy. Encephalomalacia right occipital lobe is is again noted consistent with prior infarct. Lacunar infarcts are noted in the right basal ganglia. Vascular: Atherosclerotic calcification is visualized in the carotid arteries. No dense MCA sign. Major dural sinuses are unremarkable. Skull: No evidence for skull fracture Sinuses/Orbits: Visualized paranasal sinuses are clear. Right mastoid air cells are clear. The patient has developed a left mastoid effusion in the interval since the prior study with fluid visible in the left middle ear. Other: Unremarkable. IMPRESSION: 1. Interval development of left mastoid effusion with fluid visible in  the left middle ear. 2. No acute intracranial abnormality. 3. Atrophy with chronic small vessel white matter ischemic disease and old right PCA territory infarct. Electronically Signed   By: Misty Stanley M.D.   On: 02/17/2016 19:13    Procedures Procedures (including critical care time)  Medications Ordered in ED Medications  vancomycin (VANCOCIN) IVPB 1000 mg/200 mL premix (1,000 mg Intravenous New Bag/Given 02/17/16 1908)   sodium chloride 0.9 % bolus 1,000 mL (1,000 mLs Intravenous New Bag/Given 02/17/16 1908)  vancomycin (VANCOCIN) 1,250 mg in sodium chloride 0.9 % 250 mL IVPB (not administered)  piperacillin-tazobactam (ZOSYN) IVPB 3.375 g (not administered)  piperacillin-tazobactam (ZOSYN) IVPB 3.375 g (3.375 g Intravenous New Bag/Given 02/17/16 1909)     Initial Impression / Assessment and Plan / ED Course  I have reviewed the triage vital signs and the nursing notes.  Pertinent labs & imaging results that were available during my care of the patient were reviewed by me and considered in my medical decision making (see chart for details).  Clinical Course    Patient presents with fever and left-sided headache. He has a recent pleural catheter placement. There is no obvious pneumonia on x-ray. His pleural fluid was sent for evaluation. He did meet surgical criteria for sepsis on initial valuation with fever and mild tachycardia. A code sepsis was initiated. He was given broad-spectrum antibiotics and an initial fluid bolus of 1 L. His lactate is normal and he has no hypotension. Given this, he did not get the 30 cc/kg complete bolus.  His urine does not appear to be infected. He does have evidence of a left otitis media. There is no clinical evidence of definite mastoiditis. He has no meningeal signs. His headache has improved since he's been in the ED with IV fluids. I will consult the hospitalist for admission.  Spoke with Dr. Tamala Julian who will admit pt to tele/obs  Final Clinical Impressions(s) / ED Diagnoses   Final diagnoses:  Sepsis, due to unspecified organism [facility])  Left otitis media with effusion    New Prescriptions New Prescriptions   No medications on file     Malvin Johns, MD 02/17/16 2007

## 2016-02-17 NOTE — Progress Notes (Signed)
Pt. Arrived to the unit in alert and stable condition. No s/s of distress noted. Pt. Oriented to room and call light placed within reach. Pt. Placed on tele, CCMD notified. On call NP for [facility], Tylene Fantasia, made aware of pts. Arrival to the floor. RN will continue to monitor pt.for changes in condition. Kazi Montoro, Katherine Roan

## 2016-02-17 NOTE — ED Triage Notes (Signed)
Patient complains of severe headache and fever x 2 days. Recently had drain inserted to drain fluid from chest, drain remains in place. Had tylenol around 1pm per spouse. Patient appears sleepy on arrival and warm to touch

## 2016-02-17 NOTE — Progress Notes (Signed)
Pharmacy Antibiotic Note  Ricardo Watkins is a 82 y.o. male admitted on 02/17/2016 with sepsis.  Pharmacy has been consulted for Cefepime dosing (already on vanco) for meningitis coverage in addition to r/o sepsis.   Plan: -Already on vanco -Change Zosyn to Cefepime 2g IV q12h -Ampicillin 2g IV q6h per MD -F/U meningitis work-up  Height: 5\' 8"  (172.7 cm) Weight: 186 lb (84.4 kg) (scale b) IBW/kg (Calculated) : 68.4  Temp (24hrs), Avg:99.7 F (37.6 C), Min:98.9 F (37.2 C), Max:100.7 F (38.2 C)   Recent Labs Lab 02/17/16 1722 02/17/16 1750 02/17/16 2001  WBC 9.2  --   --   CREATININE 1.70*  --   --   LATICACIDVEN  --  1.69 1.65    Estimated Creatinine Clearance: 34.8 mL/min (by C-G formula based on SCr of 1.7 mg/dL).    Allergies  Allergen Reactions  . Glucophage [Metformin Hydrochloride] Other (See Comments)    Chest pain  . Zetia [Ezetimibe] Other (See Comments)    weakness  . Fenofibrate Rash  . Niacin-Lovastatin Er Rash    Ricardo Watkins 02/17/2016 10:21 PM

## 2016-02-17 NOTE — ED Notes (Signed)
1st set of blood cultures collected at Triage

## 2016-02-17 NOTE — Progress Notes (Addendum)
Pharmacy Antibiotic Note  ZIGGY DICAPUA is a 82 y.o. male admitted on 02/17/2016 with sepsis likely due to left-sided pleural catheter placed outpatient on 8/22 for pleural effusion. Concern for pleural space infection versus pneumonia. Pharmacy has been consulted for vancomycin and zosyn dosing. Patient with low grade temperature at 100.7 and WBC normal at 9.2. SCr slightly elevated at 1.70 (baseline 1.3-1.5).   Plan: Vancomycin 1000 mg IV x1 in the ED then 1250 mg IV q24hr Zosyn 3.375g IV (30 min infusion) x1 in ED then 3.375g IV (4 hr infusion) q8hr Monitor renal function, clinical picture, and culture results Vancomycin trough at SS and PRN   Height: 5\' 6"  (167.6 cm) Weight: 188 lb (85.3 kg) IBW/kg (Calculated) : 63.8  Temp (24hrs), Avg:99.8 F (37.7 C), Min:98.9 F (37.2 C), Max:100.7 F (38.2 C)   Recent Labs Lab 02/17/16 1722 02/17/16 1750  WBC 9.2  --   CREATININE 1.70*  --   LATICACIDVEN  --  1.69    Estimated Creatinine Clearance: 33.7 mL/min (by C-G formula based on SCr of 1.7 mg/dL).    Allergies  Allergen Reactions  . Glucophage [Metformin Hydrochloride] Other (See Comments)    Chest pain  . Zetia [Ezetimibe] Other (See Comments)    weakness  . Fenofibrate Rash  . Niacin-Lovastatin Er Rash   Antimicrobials this admission: Vanc 8/29 >>  Zosyn 8/29 >>   Dose adjustments this admission: N/A  Microbiology results: 8/29 BCx >> sent  8/29 UCx >> sent 8/29 Pleural fluid >> sent    Thank you for allowing pharmacy to be a part of this patient's care.  Argie Ramming, PharmD Pharmacy Resident  Pager [phone] 02/17/16 6:57 PM

## 2016-02-17 NOTE — Progress Notes (Signed)
Subjective:    Patient ID: Ricardo Watkins, male    DOB: [birth year], 82 y.o.   MRN: [id number]  HPI  On August 22, the patient had a pleural drainage catheter placed as an outpatient due to a  recurrent left pleural effusion.  Starting this weekend, he developed fevers, rigors, chills, cough, and mild sob.  He presents today to the office with shaking chills, subjective fevers, weakness, and shortness of breath. He denies any chest pain. He denies any nausea or vomiting. He denies any dysuria or hematuria. He denies any diarrhea. He has had bloody drainage coming from his pleural catheter. The family denies any purulent appearing drainage or foul-smelling drainage. There is no erythema around the catheter site. On examination today of his lungs, his lungs sound clear although his breath sounds are markedly diminished bilaterally. However the patient appears ill and dehydrated Past Medical History:  Diagnosis Date  . Adenomatous colon polyp   . CAD (coronary artery disease)    30% LAD Stenosis, 70% ramus intermedius stenosis, treated with PTCA and angioplasty by Dr Albertine Patricia 2004  . Cataract    right eye  . Cough, persistent 11/04/2015  . Deficiency anemia 04/19/2014  . Diverticulosis   . DM (diabetes mellitus) ([place])   . DVT (deep venous thrombosis) ([place])    secondary to surgery  . Dyslipidemia   . Headache    occ  . Hyperlipidemia   . Hypertension   . Inguinal hernia    right  . 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 Microcytic anemia 07/07/2015  . 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 Non Hodgkin's lymphoma ([place])   . Pleural effusion, left 11/04/2015  . Pneumonia   . Prostate CA ([place]) 09/10/2011   Gleason 3+3 R, 3+4 L lobe May 2007 Rx Radioactive seed implants Dr [name]  . Prostate cancer ([place])    seed implants no recurrence  . PVD (peripheral vascular disease) ([place])    rt renal artery stent  . Renal insufficiency   . Shortness of breath  dyspnea   . Thrombocytopenia ([place])   . 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 Vitamin D deficiency    Past Surgical History:  Procedure Laterality Date  . APPENDECTOMY     patient ?  Marland Kitchen CARDIAC CATHETERIZATION    . CHEST TUBE INSERTION Left 02/10/2016   Procedure: INSERTION PLEURAL DRAINAGE CATHETER;  Surgeon: [name], MD;  Location: [facility];  Service: Thoracic;  Laterality: Left;  . COLONOSCOPY    . CORONARY ANGIOPLASTY    . CYSTOURETHROSCOPY     ROBOTIC ARM NUCLETRON SEED IMPLANTATION OF PROSTATE  . EXPLORATORY LAPAROTOMY     For evaluation of lymphoma   Current Outpatient Prescriptions on File Prior to Visit  Medication Sig Dispense Refill  . acetaminophen (TYLENOL) 500 MG tablet Take 500 mg by mouth every 6 (six) hours as needed for pain.    Marland Kitchen clopidogrel (PLAVIX) 75 MG tablet TAKE 1 TABLET BY MOUTH DAILY WITH BREAKFAST. 90 tablet 4  . doxazosin (CARDURA) 4 MG tablet TAKE 1 TABLET BY MOUTH ONCE A DAY 30 tablet 3  . eltrombopag (PROMACTA) 50 MG tablet Take 1 tablet (50 mg total) by mouth daily. Take on an empty stomach 1 hour before a meal or 2 hours after 30 tablet 6  .  ferrous sulfate 325 (65 FE) MG tablet Take 325 mg by mouth daily with breakfast.    . glucose blood test strip 1 each by Other route daily. Fasting each morning 100 each 3  . HYDROcodone-acetaminophen (NORCO) 5-325 MG tablet Take 1 tablet by mouth every 6 (six) hours as needed for moderate pain. 30 tablet 0  . IRON PO Take 1 tablet by mouth daily.    Marland Kitchen LANTUS SOLOSTAR 100 UNIT/ML Solostar Pen INJECT 25 UNITS INTO THE SKIN AT BEDTIME. (Patient taking differently: INJECT 15 UNITS INTO THE SKIN AT BEDTIME.) 15 pen 3  . meclizine (ANTIVERT) 12.5 MG tablet Take 1 tablet (12.5 mg total) by mouth 3 (three) times daily as needed for dizziness. 30 tablet 0  . metoCLOPramide (REGLAN) 5 MG tablet Take 1  tablet (5 mg total) by mouth 3 (three) times daily before meals. (Patient taking differently: Take 5 mg by mouth 3 (three) times daily as needed. ) 90 tablet 0  . metoprolol succinate (TOPROL-XL) 100 MG 24 hr tablet TAKE 1 TABLET BY MOUTH ONCE A DAY 90 tablet 3  . Multiple Vitamins-Minerals (MULTIVITAMIN ADULT PO) Take 1 tablet by mouth daily.    . potassium chloride SA (K-DUR,KLOR-CON) 20 MEQ tablet Take 20 mEq by mouth 2 (two) times daily.    . valsartan (DIOVAN) 160 MG tablet Take 1 tablet (160 mg total) by mouth daily. 30 tablet 11   Current Facility-Administered Medications on File Prior to Visit  Medication Dose Route Frequency Provider Last Rate Last Dose  . cyanocobalamin ((VITAMIN B-12)) injection 1,000 mcg  1,000 mcg Intramuscular Q30 days [name], MD   1,000 mcg at 01/29/16 1401   Allergies  Allergen Reactions  . Glucophage [Metformin Hydrochloride] Other (See Comments)    Chest pain  . Zetia [Ezetimibe] Other (See Comments)    weakness  . Fenofibrate Rash  . Niacin-Lovastatin Er Rash   Social History   Social History  . Marital status: Married    Spouse name: N/A  . Number of children: 4  . Years of education: N/A   Occupational History  . retired    Social History Main Topics  . Smoking status: Former Smoker    Packs/day: 0.50    Years: 20.00    Types: Pipe, Cigarettes    Quit date: 06/21/1958  . Smokeless tobacco: Never Used  . Alcohol use No  . Drug use: No  . Sexual activity: Yes    Partners: Female   Other Topics Concern  . Not on file   Social History Narrative  . No narrative on file     Review of Systems  Constitutional: Positive for activity change, appetite change, chills, diaphoresis, fatigue and fever.  Respiratory: Positive for cough and shortness of breath.        Objective:   Physical Exam  Constitutional: He appears ill.  HENT:  Right Ear: Tympanic membrane and ear canal normal.  Left Ear: Tympanic membrane and ear canal  normal.  Nose: Nose normal.  Mouth/Throat: Oropharynx is clear and moist. No oropharyngeal exudate.  Cardiovascular: Normal rate, regular rhythm and normal heart sounds.   Pulmonary/Chest: Effort normal. He has decreased breath sounds. He has no wheezes. He has no rales.  Abdominal: Normal appearance and bowel sounds are normal.  Skin: He is diaphoretic.          Assessment & Plan:  Fever, unspecified  Chills  SOB (shortness of breath)  Given the recent radical placement of a Pleurx  catheter, I am concerned about possible infection in the pleural space versus pneumonia. Patient appears ill. I'm unable to obtain a CBC today in office. I'm unable to obtain a chest x-ray this late in the evening. Therefore I recommended going to the emergency room for further evaluation to evaluate for the source of his illness. Given his advanced age, his compromised immune system, his history of malignancy, I believe this is the most appropriate course of action for a possible pleural space infection

## 2016-02-17 NOTE — ED Notes (Signed)
Paged [facility] to activate a Code Sepsis

## 2016-02-18 ENCOUNTER — Inpatient Hospital Stay (HOSPITAL_COMMUNITY): Payer: Medicare Other

## 2016-02-18 DIAGNOSIS — D469 Myelodysplastic syndrome, unspecified: Secondary | ICD-10-CM

## 2016-02-18 LAB — GLUCOSE, CAPILLARY
GLUCOSE-CAPILLARY: 99 mg/dL (ref 65–99)
GLUCOSE-CAPILLARY: 167 mg/dL — AB (ref 65–99)
Glucose-Capillary: 110 mg/dL — ABNORMAL HIGH (ref 65–99)
Glucose-Capillary: 202 mg/dL — ABNORMAL HIGH (ref 65–99)

## 2016-02-18 LAB — BODY FLUID CELL COUNT WITH DIFFERENTIAL
Eos, Fluid: 0 %
LYMPHS FL: 88 %
Monocyte-Macrophage-Serous Fluid: 11 % — ABNORMAL LOW (ref 50–90)
NEUTROPHIL FLUID: 1 % (ref 0–25)
Total Nucleated Cell Count, Fluid: 3275 cu mm — ABNORMAL HIGH (ref 0–1000)

## 2016-02-18 LAB — COMPREHENSIVE METABOLIC PANEL
ALT: 17 U/L (ref 17–63)
AST: 19 U/L (ref 15–41)
Albumin: 2.4 g/dL — ABNORMAL LOW (ref 3.5–5.0)
Alkaline Phosphatase: 52 U/L (ref 38–126)
Anion gap: 6 (ref 5–15)
BUN: 20 mg/dL (ref 6–20)
CHLORIDE: 103 mmol/L (ref 101–111)
CO2: 25 mmol/L (ref 22–32)
CREATININE: 1.68 mg/dL — AB (ref 0.61–1.24)
Calcium: 7.9 mg/dL — ABNORMAL LOW (ref 8.9–10.3)
GFR calc Af Amer: 42 mL/min — ABNORMAL LOW (ref >60)
GFR, EST NON AFRICAN AMERICAN: 36 mL/min — AB (ref >60)
GLUCOSE: 122 mg/dL — AB (ref 65–99)
Potassium: 3.9 mmol/L (ref 3.5–5.1)
Sodium: 134 mmol/L — ABNORMAL LOW (ref 135–145)
Total Bilirubin: 0.6 mg/dL (ref 0.3–1.2)
Total Protein: 6.7 g/dL (ref 6.5–8.1)

## 2016-02-18 LAB — TROPONIN I
TROPONIN I: 0.04 ng/mL — AB (ref <0.03)
Troponin I: 0.04 ng/mL (ref <0.03)

## 2016-02-18 LAB — CBC
HEMATOCRIT: 32.1 % — AB (ref 39.0–52.0)
Hemoglobin: 9.7 g/dL — ABNORMAL LOW (ref 13.0–17.0)
MCH: 23.9 pg — ABNORMAL LOW (ref 26.0–34.0)
MCHC: 30.2 g/dL (ref 30.0–36.0)
MCV: 79.1 fL (ref 78.0–100.0)
PLATELETS: 44 10*3/uL — AB (ref 150–400)
RBC: 4.06 MIL/uL — AB (ref 4.22–5.81)
RDW: 23.8 % — ABNORMAL HIGH (ref 11.5–15.5)
WBC: 8.6 10*3/uL (ref 4.0–10.5)

## 2016-02-18 LAB — GRAM STAIN

## 2016-02-18 LAB — PATHOLOGIST SMEAR REVIEW

## 2016-02-18 MED ORDER — ENSURE ENLIVE PO LIQD
237.0000 mL | ORAL | Status: DC
  Administered 2016-02-18 – 2016-02-21 (×4): 237 mL via ORAL

## 2016-02-18 MED ORDER — VANCOMYCIN HCL 10 G IV SOLR
1250.0000 mg | INTRAVENOUS | Status: DC
  Administered 2016-02-18: 1250 mg via INTRAVENOUS
  Filled 2016-02-18 (×2): qty 1250

## 2016-02-18 MED ORDER — SODIUM CHLORIDE 0.9 % IV SOLN
1.0000 g | Freq: Once | INTRAVENOUS | Status: DC
  Filled 2016-02-18: qty 10

## 2016-02-18 NOTE — Progress Notes (Signed)
Drained 666ml from PleurX drainage. Fluid sample sent. Pt tolerated well.

## 2016-02-18 NOTE — Progress Notes (Signed)
Initial Nutrition Assessment  INTERVENTION:  Provide Breakfast Essentials with breakfast and lunch Provide Magic cup ice cream with dinner Provide Ensure Enlive po once daily at night, each supplement provides 350 kcal and 20 grams of protein   NUTRITION DIAGNOSIS:   Inadequate oral intake related to poor appetite as evidenced by per patient/family report, percent weight loss.   GOAL:   Patient will meet greater than or equal to 90% of their needs   MONITOR:   PO intake, Supplement acceptance, Skin, Labs  REASON FOR ASSESSMENT:   Malnutrition Screening Tool    ASSESSMENT:   82 year old male with a history of diabetes, CHF, prior DVT and non-Hodgkin's lymphoma presents with fever. He recently had a pleural catheter placed last week by Dr. Lucianne Lei trite for recurrent pleural effusions. He states that for the last 2 days she's been running fevers that it been around 100. He's had chills and generalized weakness.   Pt asleep at time of visit. Moderate muscle wasting noted in clavicles and acromion region. Per weight history, pt has lost from 210 lbs to 185 lbs in the past 10 months. Family at bedside state that patient has had a poor appetite for quite some time and started drinking Ensure at home (only likes it room temperature). Daughter feels that pt would like Breakfast Essentials better. Per family he eats mostly beans, eggs, cheese sandwiches, oatmeal cookies and other soft bland foods. Per nursing notes, pt ate 85% of breakfast this morning.   Labs: stable glucose, low calcium, low hemoglobin  Diet Order:  Diet heart healthy/carb modified Room service appropriate? Yes; Fluid consistency: Thin  Skin:  Reviewed, no issues  Last BM:  8/28  Height:   Ht Readings from Last 1 Encounters:  02/17/16 5\' 8"  (1.727 m)    Weight:   Wt Readings from Last 1 Encounters:  02/18/16 185 lb 11.2 oz (84.2 kg)    Ideal Body Weight:  70 kg  BMI:  Body mass index is 28.24  kg/m.  Estimated Nutritional Needs:   Kcal:  2000-2200  Protein:  100-110 grams  Fluid:  2-2.2 L/day  EDUCATION NEEDS:   No education needs identified at this time  [place], CSP, LDN Inpatient Clinical Dietitian Pager: [phone] After Hours Pager: [phone]

## 2016-02-18 NOTE — Progress Notes (Signed)
PROGRESS NOTE        PATIENT DETAILS Name: Ricardo Watkins Age: 82 y.o. Sex: male Date of Birth: 1931-05-16 Admit Date: 02/17/2016 Admitting Physician Norval Morton, MD [id number] TOM, MD  Brief Narrative: Patient is a 82 y.o. male myelodysplastic syndrome, thrombocytopenia secondary to MDS, recurrent exudative asked pleural effusion status post Pleurx catheter placement on 8/22, admitted with several days history of fever, shortness of breath, cough and headache.  Subjective: Only low-grade fever overnight. Headache much better. Per spouse and daughter he looks a whole lot better than yesterday.  Assessment/Plan: Active Problems: Sepsis secondary to a unknown source: Sepsis pathophysiology is resolving. Foci of infection is not apparent-UA/CT chest negative for infection. Blood/ pleural fluid cultures pending. Although has a headache (has history of chronic headaches), he has no meningeal signs on exam and does not look acutely sick. Unfortunately patient is severely thrombocytopenic with platelet count of 44K, is also on Plavix-therefore unable to proceed with lumbar puncture-but doubt meningitis. For now continue with current antibiotics, await culture data, if no site of infection is apparent, we will likely need infectious disease input.  Recurrent left-sided pleural effusion: Exudative by light's criteria-underwent a recent left Pleurx catheter placement on 8/22. Catheter site that any obvious infection-we will obtain pleural fluid cell count and culture today.  History of myelodysplastic syndrome and related chronic thrombocytopenia: Followed by hematology ([facility])-continue Promacta  History of CAD status post remote PCI: Cautiously continue with Plavix given thrombocytopenia (seems to be tolerating Plavix well with platelet count around 50 K). Continue metoprolol.  Prior history of CVA: Nonfocal exam, continue Plavix-if platelet counts decrease-  further may need to stop  Type 2 diabetes: CBGs stable, continue with SSI and 5 units of Lantus. Recent A1c 6.7 on 12/29/15  Chronic kidney disease stage III: Creatinine close to usual baseline and follow.  History of chronic intermittent headaches: Supportive care-claims that his headaches are usually not as severe as on initial presentation to the emergency room. Not sure if worsening headaches is secondary to mastoiditis seen on CT scan. Antibiotics as above, follow clinical course for now as headaches are improved.  Hyponatremia: Very mild, plan is to follow electrolytes.  Hypertension: Controlled, continue losartan, metoprolol and Cardura.  Remote history of follicular lymphoma  DVT Prophylaxis: SCD's  Code Status: Full code  Family Communication: Daughter/Spouse at bedside  Disposition Plan: Remain inpatient-will require several more days of hospitalization prior to discharge.  Antimicrobial agents: IV vancomycin 8/29>> IV cefepime 8/29>> IV ampicillin 8/28>>  Procedures: None  CONSULTS:  None  Time spent: 25 minutes-Greater than 50% of this time was spent in counseling, explanation of diagnosis, planning of further management, and coordination of care.  MEDICATIONS: Anti-infectives    Start     Dose/Rate Route Frequency Ordered Stop   02/18/16 1700  vancomycin (VANCOCIN) 1,250 mg in sodium chloride 0.9 % 250 mL IVPB  Status:  Discontinued     1,250 mg 166.7 mL/hr over 90 Minutes Intravenous Every 24 hours 02/17/16 1853 02/18/16 1008   02/18/16 1400  vancomycin (VANCOCIN) 1,250 mg in sodium chloride 0.9 % 250 mL IVPB     1,250 mg 166.7 mL/hr over 90 Minutes Intravenous Every 24 hours 02/18/16 1008     02/18/16 0300  piperacillin-tazobactam (ZOSYN) IVPB 3.375 g  Status:  Discontinued     3.375 g 12.5 mL/hr over  240 Minutes Intravenous Every 8 hours 02/17/16 1853 02/17/16 2211   02/17/16 2215  ampicillin (OMNIPEN) 2 g in sodium chloride 0.9 % 50 mL IVPB     2  g 150 mL/hr over 20 Minutes Intravenous Every 6 hours 02/17/16 2211     02/17/16 2215  ceFEPIme (MAXIPIME) 2 g in dextrose 5 % 50 mL IVPB     2 g 100 mL/hr over 30 Minutes Intravenous Every 12 hours 02/17/16 2211     02/17/16 1845  piperacillin-tazobactam (ZOSYN) IVPB 3.375 g     3.375 g 100 mL/hr over 30 Minutes Intravenous  Once 02/17/16 1834 02/17/16 1939   02/17/16 1845  vancomycin (VANCOCIN) IVPB 1000 mg/200 mL premix     1,000 mg 200 mL/hr over 60 Minutes Intravenous  Once 02/17/16 1834 02/17/16 2008      Scheduled Meds: . ampicillin (OMNIPEN) IV  2 g Intravenous Q6H  . calcium gluconate  1 g Intravenous Once  . ceFEPime (MAXIPIME) IV  2 g Intravenous Q12H  . clopidogrel  75 mg Oral Daily  . doxazosin  4 mg Oral Daily  . eltrombopag  50 mg Oral Daily  . feeding supplement (ENSURE ENLIVE)  237 mL Oral Q24H  . ferrous sulfate  325 mg Oral Q breakfast  . insulin aspart  0-9 Units Subcutaneous TID WC  . insulin glargine  5 Units Subcutaneous QHS  . irbesartan  150 mg Oral Daily  . metoCLOPramide  5 mg Oral TID AC  . metoprolol succinate  100 mg Oral Daily  . multivitamin with minerals  1 tablet Oral Daily  . potassium chloride SA  20 mEq Oral BID  . vancomycin  1,250 mg Intravenous Q24H   Continuous Infusions:  PRN Meds:.acetaminophen **OR** acetaminophen, albuterol, HYDROcodone-acetaminophen, ipratropium, meclizine, ondansetron **OR** ondansetron (ZOFRAN) IV   PHYSICAL EXAM: Vital signs: Vitals:   02/17/16 2045 02/17/16 2134 02/18/16 0430 02/18/16 1228  BP: 140/60 (!) 137/56 129/66 (!) 111/45  Pulse: 69 74 93 86  Resp: (!) 27 (!) 22 20 20   Temp:  99.6 F (37.6 C) 98.7 F (37.1 C) 99.7 F (37.6 C)  TempSrc:  Oral Oral Oral  SpO2: 96% 100% 93% 93%  Weight:  84.4 kg (186 lb) 84.2 kg (185 lb 11.2 oz)   Height:  5\' 8"  (1.727 m)     Filed Weights   02/17/16 1712 02/17/16 2134 02/18/16 0430  Weight: 85.3 kg (188 lb) 84.4 kg (186 lb) 84.2 kg (185 lb 11.2 oz)   Body  mass index is 28.24 kg/m.   Gen Exam: Awake and alert with clear speech. Not in any distress  Neck: Supple, No JVD.  No meningeal signs. Chest: B/L Clear.   CVS: S1 S2 Regular, no murmurs.  Abdomen: soft, BS +, non tender, non distended.  Extremities: no edema, lower extremities warm to touch. Neurologic: Non Focal.   Skin: No Rash or lesions   Wounds: N/A.    I have personally reviewed following labs and imaging studies  LABORATORY DATA: CBC:  Recent Labs Lab 02/17/16 1722 02/18/16 0456  WBC 9.2 8.6  NEUTROABS 5.5  --   HGB 11.5* 9.7*  HCT 38.5* 32.1*  MCV 79.9 79.1  PLT 45* 44*    Basic Metabolic Panel:  Recent Labs Lab 02/17/16 1722 02/17/16 2259 02/18/16 0456  NA 132*  --  134*  K 3.9  --  3.9  CL 99*  --  103  CO2 25  --  25  GLUCOSE 143*  --  122*  BUN 20  --  20  CREATININE 1.70*  --  1.68*  CALCIUM 8.3*  --  7.9*  MG  --  1.7  --     GFR: Estimated Creatinine Clearance: 35.2 mL/min (by C-G formula based on SCr of 1.68 mg/dL).  Liver Function Tests:  Recent Labs Lab 02/17/16 1722 02/18/16 0456  AST 22 19  ALT 20 17  ALKPHOS 63 52  BILITOT 0.2* 0.6  PROT 8.2* 6.7  ALBUMIN 2.8* 2.4*   No results for input(s): LIPASE, AMYLASE in the last 168 hours. No results for input(s): AMMONIA in the last 168 hours.  Coagulation Profile: No results for input(s): INR, PROTIME in the last 168 hours.  Cardiac Enzymes:  Recent Labs Lab 02/17/16 2259 02/18/16 0456 02/18/16 1019  TROPONINI 0.03* 0.04* 0.04*    BNP (last 3 results)  Recent Labs  01/27/16 1119  PROBNP 311.0*    HbA1C: No results for input(s): HGBA1C in the last 72 hours.  CBG:  Recent Labs Lab 02/17/16 2126 02/18/16 0555 02/18/16 1153  GLUCAP 149* 110* 99    Lipid Profile: No results for input(s): CHOL, HDL, LDLCALC, TRIG, CHOLHDL, LDLDIRECT in the last 72 hours.  Thyroid Function Tests: No results for input(s): TSH, T4TOTAL, FREET4, T3FREE, THYROIDAB in the last  72 hours.  Anemia Panel: No results for input(s): VITAMINB12, FOLATE, FERRITIN, TIBC, IRON, RETICCTPCT in the last 72 hours.  Urine analysis:    Component Value Date/Time   COLORURINE YELLOW 02/17/2016 1819   APPEARANCEUR HAZY (A) 02/17/2016 1819   LABSPEC 1.030 02/17/2016 1819   PHURINE 5.0 02/17/2016 1819   GLUCOSEU NEGATIVE 02/17/2016 1819   HGBUR TRACE (A) 02/17/2016 1819   BILIRUBINUR NEGATIVE 02/17/2016 1819   KETONESUR NEGATIVE 02/17/2016 1819   PROTEINUR 100 (A) 02/17/2016 1819   NITRITE NEGATIVE 02/17/2016 1819   LEUKOCYTESUR NEGATIVE 02/17/2016 1819    Sepsis Labs: Lactic Acid, Venous    Component Value Date/Time   LATICACIDVEN 1.65 02/17/2016 2001    MICROBIOLOGY: Recent Results (from the past 240 hour(s))  Gram stain     Status: None   Collection Time: 02/18/16 11:49 AM  Result Value Ref Range Status   Specimen Description FLUID LEFT PLEURAL  Final   Special Requests NONE  Final   Gram Stain   Final    ABUNDANT WBC PRESENT,BOTH PMN AND MONONUCLEAR NO ORGANISMS SEEN    Report Status 02/18/2016 FINAL  Final    RADIOLOGY STUDIES/RESULTS: Dg Chest 1 View  Result Date: 01/30/2016 CLINICAL DATA:  Status post thoracentesis. EXAM: CHEST 1 VIEW COMPARISON:  Chest x-ray 01/27/2016 FINDINGS: The cardiac silhouette, mediastinal and hilar contours are within normal limits and stable. Tortuosity and calcification of the thoracic aorta. Biapical pleural thickening. Significant interval decrease in size of the left-sided pleural effusion. Small residual pleural effusion and overlying atelectasis. No postprocedural pneumothorax. IMPRESSION: Interval reduction in size of left-sided pleural effusion status post thoracentesis. No postprocedural pneumothorax. Electronically Signed   By: Marijo Sanes M.D.   On: 01/30/2016 10:20   Dg Chest 2 View  Result Date: 02/17/2016 CLINICAL DATA:  Fever, cough, and weakness for 1 week. EXAM: CHEST  2 VIEW COMPARISON:  02/10/2016 FINDINGS:  A left chest tube remains in place. Improved left pleural effusion or thickening. Mild atelectasis or infiltration in the left base is also improved. Right lung is clear. Normal heart size and pulmonary vascularity. Calcified and tortuous aorta. No pneumothorax. IMPRESSION: Improved infiltration and effusion in the left base. Left  chest tube remains place. Electronically Signed   By: Lucienne Capers M.D.   On: 02/17/2016 18:20   Dg Chest 2 View  Result Date: 02/10/2016 CLINICAL DATA:  Preop insertion of PleurX catheter left lung. Recurrent left pleural effusion. EXAM: CHEST  2 VIEW COMPARISON:  01/30/2016 FINDINGS: Large left pleural effusion, increased since prior study. Left base atelectasis. No focal opacity on the right. Heart is likely normal in size. No acute bony abnormality. IMPRESSION: Large left pleural effusion, increased since prior study. Left base atelectasis. Electronically Signed   By: Rolm Baptise M.D.   On: 02/10/2016 13:47   Dg Chest 2 View  Result Date: 01/27/2016 CLINICAL DATA:  Chronic shortness of breath and cough, worsening over the past 2 weeks. EXAM: CHEST  2 VIEW COMPARISON:  12/12/2015. FINDINGS: No significant change in a moderate-sized left pleural effusion with associated left lower lobe opacity. This is obscuring portions of the left heart border with no gross change in probable mild cardiac enlargement. The right lung remains clear. Thoracic spine degenerative changes. IMPRESSION: Stable left pleural effusion and associated left basilar atelectasis. Underlying pneumonia cannot be excluded. Electronically Signed   By: Claudie Revering M.D.   On: 01/27/2016 13:59   Ct Head Wo Contrast  Result Date: 02/17/2016 CLINICAL DATA:  Headache. EXAM: CT HEAD WITHOUT CONTRAST TECHNIQUE: Contiguous axial images were obtained from the base of the skull through the vertex without intravenous contrast. COMPARISON:  10/18/2014. FINDINGS: Brain: There is no evidence for acute hemorrhage,  hydrocephalus, mass lesion, or abnormal extra-axial fluid collection. No definite CT evidence for acute infarction. Diffuse loss of parenchymal volume is consistent with atrophy. Encephalomalacia right occipital lobe is is again noted consistent with prior infarct. Lacunar infarcts are noted in the right basal ganglia. Vascular: Atherosclerotic calcification is visualized in the carotid arteries. No dense MCA sign. Major dural sinuses are unremarkable. Skull: No evidence for skull fracture Sinuses/Orbits: Visualized paranasal sinuses are clear. Right mastoid air cells are clear. The patient has developed a left mastoid effusion in the interval since the prior study with fluid visible in the left middle ear. Other: Unremarkable. IMPRESSION: 1. Interval development of left mastoid effusion with fluid visible in the left middle ear. 2. No acute intracranial abnormality. 3. Atrophy with chronic small vessel white matter ischemic disease and old right PCA territory infarct. Electronically Signed   By: Misty Stanley M.D.   On: 02/17/2016 19:13   Ct Chest Wo Contrast  Result Date: 02/18/2016 CLINICAL DATA:  Fever, cough, weakness. EXAM: CT CHEST WITHOUT CONTRAST TECHNIQUE: Multidetector CT imaging of the chest was performed following the standard protocol without IV contrast. COMPARISON:  11/11/2015. FINDINGS: Cardiovascular: Atherosclerotic calcification of the arterial vasculature, including three-vessel involvement of the coronary arteries. Heart is mildly enlarged. No pericardial effusion. Mediastinum/Nodes: Left thyroid nodule measures 2.3 cm, as before. Mediastinal lymph nodes are not enlarged by CT size criteria. Hilar regions are difficult to definitively evaluate without IV contrast. Lymph nodes adjacent to the descending thoracic aorta measure up to 1.3 cm on the left, stable. No axillary adenopathy. Esophagus is grossly unremarkable. Lungs/Pleura: Mild patchy ground-glass in the lungs bilaterally, right  greater than left, possibly due to expiratory phase imaging. Trace left pleural effusion with a small bore catheter in place. Airway is otherwise unremarkable. Upper Abdomen: Visualized portions of the liver, gallbladder are, adrenal glands unremarkable. Tiny stones in the right kidney. Visualized portions of the left kidney, spleen, pancreas, stomach and bowel are grossly unremarkable. Musculoskeletal: No worrisome  lytic or sclerotic lesions. Degenerative changes are seen in the spine. IMPRESSION: 1. No findings to explain the patient's symptoms. 2. Trace left pleural effusion with small bore drainage catheter in place. 3. Lymph nodes adjacent to the descending thoracic aorta are stable. 4. Aortic atherosclerosis and three-vessel coronary artery calcification. 5. **An incidental finding of potential clinical significance has been found. Left thyroid nodule. Consider further evaluation with thyroid ultrasound. If patient is clinically hyperthyroid, consider nuclear medicine thyroid uptake and scan.** 6. Tiny right renal stones. Electronically Signed   By: Lorin Picket M.D.   On: 02/18/2016 13:24   Dg Chest Portable 1 View  Result Date: 02/10/2016 CLINICAL DATA:  Status post left Pleurex catheter insertion. EXAM: PORTABLE CHEST 1 VIEW COMPARISON:  02/10/2016 FINDINGS: Cardiomediastinal silhouette is normal. Mediastinal contours appear intact. There is no evidence of pneumothorax. There has been insertion of left chest tube with interval decrease in the known left pleural effusion. Low lung volumes with crowding of the interstitial markings. Osseous structures are without acute abnormality. Soft tissues are grossly normal. IMPRESSION: Status post left chest tube placement with decrease in left pleural effusion volume. Low lung volume. No evidence of pneumothorax. Electronically Signed   By: Fidela Salisbury M.D.   On: 02/10/2016 16:57   US Thoracentesis Asp Pleural Space W/img Guide  Result Date:  01/30/2016 INDICATION: Recurrent left pleural effusion. Request diagnostic and therapeutic thoracentesis. EXAM: ULTRASOUND GUIDED LEFT THORACENTESIS MEDICATIONS: None. COMPLICATIONS: None immediate. PROCEDURE: An ultrasound guided thoracentesis was thoroughly discussed with the patient and questions answered. The benefits, risks, alternatives and complications were also discussed. The patient understands and wishes to proceed with the procedure. Written consent was obtained. Ultrasound was performed to localize and mark an adequate pocket of fluid in the left chest. The area was then prepped and draped in the normal sterile fashion. 1% Lidocaine was used for local anesthesia. Under ultrasound guidance a Safe-T-Centesis catheter was introduced. Thoracentesis was performed. The catheter was removed and a dressing applied. FINDINGS: A total of approximately 2.2 L of hazy, amber colored fluid was removed. Samples were sent to the laboratory as requested by the clinical team. IMPRESSION: Successful ultrasound guided left thoracentesis yielding 2.2 L of pleural fluid. Read by: Ascencion Dike PA-C Electronically Signed   By: Aletta Edouard M.D.   On: 01/30/2016 10:14     LOS: 1 day   Oren Binet, MD  [facility] Pager:[phone]  If 7PM-7AM, please contact night-coverage [email or web address] Password [email or web address] 02/18/2016, 2:24 PM

## 2016-02-18 NOTE — Progress Notes (Signed)
[facility]  Patient Status: Active (receiving services up to time of hospitalization)  [facility] is providing the following services: RN  If patient discharges after hours, please call [phone].   Ricardo Watkins 02/18/2016, 11:54 AM

## 2016-02-18 NOTE — Progress Notes (Signed)
Pt. With troponin Value of 0.03. On call NP for [facility],  Tylene Fantasia, made aware via text page.

## 2016-02-19 DIAGNOSIS — J90 Pleural effusion, not elsewhere classified: Secondary | ICD-10-CM | POA: Diagnosis not present

## 2016-02-19 DIAGNOSIS — E119 Type 2 diabetes mellitus without complications: Secondary | ICD-10-CM | POA: Diagnosis not present

## 2016-02-19 DIAGNOSIS — D469 Myelodysplastic syndrome, unspecified: Secondary | ICD-10-CM | POA: Diagnosis not present

## 2016-02-19 DIAGNOSIS — Z4682 Encounter for fitting and adjustment of non-vascular catheter: Secondary | ICD-10-CM | POA: Diagnosis not present

## 2016-02-19 DIAGNOSIS — H65192 Other acute nonsuppurative otitis media, left ear: Secondary | ICD-10-CM

## 2016-02-19 DIAGNOSIS — I10 Essential (primary) hypertension: Secondary | ICD-10-CM | POA: Diagnosis not present

## 2016-02-19 DIAGNOSIS — D696 Thrombocytopenia, unspecified: Secondary | ICD-10-CM | POA: Diagnosis not present

## 2016-02-19 LAB — GLUCOSE, CAPILLARY
GLUCOSE-CAPILLARY: 119 mg/dL — AB (ref 65–99)
GLUCOSE-CAPILLARY: 121 mg/dL — AB (ref 65–99)
GLUCOSE-CAPILLARY: 195 mg/dL — AB (ref 65–99)
GLUCOSE-CAPILLARY: 175 mg/dL — AB (ref 65–99)
Glucose-Capillary: 228 mg/dL — ABNORMAL HIGH (ref 65–99)

## 2016-02-19 LAB — CBC
HEMATOCRIT: 33.5 % — AB (ref 39.0–52.0)
Hemoglobin: 9.8 g/dL — ABNORMAL LOW (ref 13.0–17.0)
MCH: 23.7 pg — ABNORMAL LOW (ref 26.0–34.0)
MCHC: 29.3 g/dL — AB (ref 30.0–36.0)
MCV: 81.1 fL (ref 78.0–100.0)
PLATELETS: 50 10*3/uL — AB (ref 150–400)
RBC: 4.13 MIL/uL — ABNORMAL LOW (ref 4.22–5.81)
RDW: 23.8 % — AB (ref 11.5–15.5)
WBC: 5.8 10*3/uL (ref 4.0–10.5)

## 2016-02-19 LAB — BASIC METABOLIC PANEL
Anion gap: 3 — ABNORMAL LOW (ref 5–15)
BUN: 18 mg/dL (ref 6–20)
CO2: 28 mmol/L (ref 22–32)
CREATININE: 1.55 mg/dL — AB (ref 0.61–1.24)
Calcium: 8.2 mg/dL — ABNORMAL LOW (ref 8.9–10.3)
Chloride: 105 mmol/L (ref 101–111)
GFR calc Af Amer: 46 mL/min — ABNORMAL LOW (ref >60)
GFR, EST NON AFRICAN AMERICAN: 40 mL/min — AB (ref >60)
GLUCOSE: 127 mg/dL — AB (ref 65–99)
POTASSIUM: 4.1 mmol/L (ref 3.5–5.1)
SODIUM: 136 mmol/L (ref 135–145)

## 2016-02-19 MED ORDER — SODIUM CHLORIDE 0.9 % IV SOLN
3.0000 g | Freq: Three times a day (TID) | INTRAVENOUS | Status: DC
  Administered 2016-02-19 – 2016-02-21 (×6): 3 g via INTRAVENOUS
  Filled 2016-02-19 (×8): qty 3

## 2016-02-19 NOTE — Progress Notes (Signed)
Pharmacy Antibiotic Note  Ricardo Watkins is a 82 y.o. male admitted on 02/17/2016 with sepsis.  Pharmacy has been consulted for unasyn.  Pt was started on ampicillin, cefepime and vancomycin empirically for meningitis rule out.  Plan: Unasyn 3g IV q8h Monitor culture data, renal function and clinical course   Height: 5\' 8"  (172.7 cm) Weight: 181 lb 3.2 oz (82.2 kg) (b scale) IBW/kg (Calculated) : 68.4  Temp (24hrs), Avg:98.8 F (37.1 C), Min:98.1 F (36.7 C), Max:99.6 F (37.6 C)   Recent Labs Lab 02/17/16 1722 02/17/16 1750 02/17/16 2001 02/18/16 0456 02/19/16 0454  WBC 9.2  --   --  8.6 5.8  CREATININE 1.70*  --   --  1.68* 1.55*  LATICACIDVEN  --  1.69 1.65  --   --     Estimated Creatinine Clearance: 37.7 mL/min (by C-G formula based on SCr of 1.55 mg/dL).    Allergies  Allergen Reactions  . Glucophage [Metformin Hydrochloride] Other (See Comments)    Chest pain  . Zetia [Ezetimibe] Other (See Comments)    weakness  . Fenofibrate Rash  . Niacin-Lovastatin Er Rash   Antimicrobials this admission: Unasyn 8/31>> Vanc 8/29>>8/31 Cefepime 8/29>>8/31 Ampicillin 8/29 >>8/31 Zosyn 8/29  Dose adjustments this admission: N/A  Microbiology results: 8/29BCx: ngtd  8/29UCx: ngtd 8/29 Pleural fluid: ngtd   Andrey Cota. Diona Foley, PharmD, [facility] Clinical Pharmacist Pager [phone] 02/19/2016 1:00 PM

## 2016-02-19 NOTE — Progress Notes (Addendum)
PROGRESS NOTE        PATIENT DETAILS Name: Ricardo Watkins Age: 82 y.o. Sex: male Date of Birth: 16-Oct-1930 Admit Date: 02/17/2016 Admitting Physician Norval Morton, MD [id number] TOM, MD  Brief Narrative: Patient is a 82 y.o. male myelodysplastic syndrome, thrombocytopenia secondary to MDS, recurrent exudative pleural effusion status post Pleurx catheter placement on 8/22, admitted with several days history of fever, shortness of breath, cough and headache.  Subjective: Feels much better, has some left ear pain today. Continues to have some mild intermittent headache  Assessment/Plan: Active Problems: Sepsis secondary to a unknown source: Sepsis pathophysiology is resolving. Foci of infection is not apparent-UA/CT chest negative for infection. Does have probable otitis media-but not sure if this can account for this degree of infection. Blood cultures negative so far, pleural fluid still pending. Although did have headache on admission, no meningeal signs and felt not to have meningitis. Furthermore, patient has severe thrombocytopenia and is on Plavix, and hence lumbar puncture was not pursued. Since he feels a lot better, I suspect we can start minimizing/narrowing his antibiotic spectrum-I will discontinue vancomycin and cefepime and just transitioning to Unasyn.  Recurrent left-sided pleural effusion: Exudative by light's criteria-underwent a recent left Pleurx catheter placement on 8/22. Catheter site that any obvious infection-pleural fluid cell count shows increased WBC with predominant lymphocytes. Pleural fluid cultures are still pending.   Left ear pain: Likely secondary to otitis media-on otoscopic exam-some wax present in the canal-but see no drainage in the external canal-visualized portion of the tympanic membrane appears red and inflamed,-I do not she see any obvious perforation. Case was discussed with ENT-Dr Teo-who recommended that we  continue with broad-spectrum antibiotics-he suggested that we use either clindamycin or Unasyn for this particular indication. He reviewed the CT of the head, and did not think the mastoid effusion was anything significant.  History of myelodysplastic syndrome and related chronic thrombocytopenia: Followed by hematology (Gorsuch)-continue Promacta  History of CAD status post remote PCI: Cautiously continue with Plavix given thrombocytopenia (seems to be tolerating Plavix well with platelet count around 50 K). Continue metoprolol.  Prior history of CVA: Nonfocal exam, continue Plavix-if platelet counts decrease- further may need to stop  Type 2 diabetes: CBGs stable, continue with SSI and 5 units of Lantus. Recent A1c 6.7 on 12/29/15  Chronic kidney disease stage III: Creatinine close to usual baseline and follow.  History of chronic intermittent headaches: Supportive care-claims that his headaches are usually not as severe as on initial presentation to the emergency room. Not sure if worsening headaches is secondary to otitis media/fluid in the mass left mastoid seen on CT scan. Antibiotics as above, follow clinical course for now as headaches are improved.  Hyponatremia: Resolved, follow electrolytes.  Hypertension: Controlled, continue losartan, metoprolol and Cardura.  Remote history of follicular lymphoma  DVT Prophylaxis: SCD's  Code Status: Full code  Family Communication: Daughter/Spouse at bedside  Disposition Plan: Remain inpatient-will 1-2 more days of hospitalization prior to discharge.  Antimicrobial agents: IV vancomycin 8/29>>8/31 IV cefepime 8/29>>8/31 IV ampicillin 8/28>>8/31  Procedures: None  CONSULTS:  None  Time spent: 25 minutes-Greater than 50% of this time was spent in counseling, explanation of diagnosis, planning of further management, and coordination of care.  MEDICATIONS: Anti-infectives    Start     Dose/Rate Route Frequency Ordered Stop    02/18/16 1700  vancomycin (VANCOCIN) 1,250 mg in sodium chloride 0.9 % 250 mL IVPB  Status:  Discontinued     1,250 mg 166.7 mL/hr over 90 Minutes Intravenous Every 24 hours 02/17/16 1853 02/18/16 1008   02/18/16 1400  vancomycin (VANCOCIN) 1,250 mg in sodium chloride 0.9 % 250 mL IVPB     1,250 mg 166.7 mL/hr over 90 Minutes Intravenous Every 24 hours 02/18/16 1008     02/18/16 0300  piperacillin-tazobactam (ZOSYN) IVPB 3.375 g  Status:  Discontinued     3.375 g 12.5 mL/hr over 240 Minutes Intravenous Every 8 hours 02/17/16 1853 02/17/16 2211   02/17/16 2215  ampicillin (OMNIPEN) 2 g in sodium chloride 0.9 % 50 mL IVPB     2 g 150 mL/hr over 20 Minutes Intravenous Every 6 hours 02/17/16 2211     02/17/16 2215  ceFEPIme (MAXIPIME) 2 g in dextrose 5 % 50 mL IVPB     2 g 100 mL/hr over 30 Minutes Intravenous Every 12 hours 02/17/16 2211     02/17/16 1845  piperacillin-tazobactam (ZOSYN) IVPB 3.375 g     3.375 g 100 mL/hr over 30 Minutes Intravenous  Once 02/17/16 1834 02/17/16 1939   02/17/16 1845  vancomycin (VANCOCIN) IVPB 1000 mg/200 mL premix     1,000 mg 200 mL/hr over 60 Minutes Intravenous  Once 02/17/16 1834 02/17/16 2008      Scheduled Meds: . ampicillin (OMNIPEN) IV  2 g Intravenous Q6H  . calcium gluconate  1 g Intravenous Once  . ceFEPime (MAXIPIME) IV  2 g Intravenous Q12H  . clopidogrel  75 mg Oral Daily  . doxazosin  4 mg Oral Daily  . eltrombopag  50 mg Oral Daily  . feeding supplement (ENSURE ENLIVE)  237 mL Oral Q24H  . ferrous sulfate  325 mg Oral Q breakfast  . insulin aspart  0-9 Units Subcutaneous TID WC  . insulin glargine  5 Units Subcutaneous QHS  . irbesartan  150 mg Oral Daily  . metoCLOPramide  5 mg Oral TID AC  . metoprolol succinate  100 mg Oral Daily  . multivitamin with minerals  1 tablet Oral Daily  . potassium chloride SA  20 mEq Oral BID  . vancomycin  1,250 mg Intravenous Q24H   Continuous Infusions:  PRN Meds:.acetaminophen **OR**  acetaminophen, albuterol, HYDROcodone-acetaminophen, ipratropium, meclizine, ondansetron **OR** ondansetron (ZOFRAN) IV   PHYSICAL EXAM: Vital signs: Vitals:   02/18/16 1228 02/18/16 2042 02/19/16 0547 02/19/16 1222  BP: (!) 111/45 (!) 106/45 (!) 144/62 (!) 116/57  Pulse: 86 88 95 88  Resp: 20 18  16   Temp: 99.7 F (37.6 C) 99.6 F (37.6 C) 98.8 F (37.1 C) 98.1 F (36.7 C)  TempSrc: Oral Oral Oral Oral  SpO2: 93% 94% 94% 96%  Weight:   82.2 kg (181 lb 3.2 oz)   Height:       Filed Weights   02/17/16 2134 02/18/16 0430 02/19/16 0547  Weight: 84.4 kg (186 lb) 84.2 kg (185 lb 11.2 oz) 82.2 kg (181 lb 3.2 oz)   Body mass index is 27.55 kg/m.   Gen Exam: Awake and alert with clear speech. Not in any distress  Neck: Supple, No JVD.  No meningeal signs. Otoscopic exam: Left side-some wax present in the external canal, but no discharge seen, visualized portion of the tympanic membrane inflamed. No perforation seen. Right side appeared without any significant abnormalities except for some wax the external auditory canal. Chest: B/L Clear.   CVS: S1 S2 Regular,  no murmurs.  Abdomen: soft, BS +, non tender, non distended.  Extremities: no edema, lower extremities warm to touch. Neurologic: Non Focal.   Skin: No Rash or lesions   Wounds: N/A.    I have personally reviewed following labs and imaging studies  LABORATORY DATA: CBC:  Recent Labs Lab 02/17/16 1722 02/18/16 0456 02/19/16 0454  WBC 9.2 8.6 5.8  NEUTROABS 5.5  --   --   HGB 11.5* 9.7* 9.8*  HCT 38.5* 32.1* 33.5*  MCV 79.9 79.1 81.1  PLT 45* 44* 50*    Basic Metabolic Panel:  Recent Labs Lab 02/17/16 1722 02/17/16 2259 02/18/16 0456 02/19/16 0454  NA 132*  --  134* 136  K 3.9  --  3.9 4.1  CL 99*  --  103 105  CO2 25  --  25 28  GLUCOSE 143*  --  122* 127*  BUN 20  --  20 18  CREATININE 1.70*  --  1.68* 1.55*  CALCIUM 8.3*  --  7.9* 8.2*  MG  --  1.7  --   --     GFR: Estimated Creatinine  Clearance: 37.7 mL/min (by C-G formula based on SCr of 1.55 mg/dL).  Liver Function Tests:  Recent Labs Lab 02/17/16 1722 02/18/16 0456  AST 22 19  ALT 20 17  ALKPHOS 63 52  BILITOT 0.2* 0.6  PROT 8.2* 6.7  ALBUMIN 2.8* 2.4*   No results for input(s): LIPASE, AMYLASE in the last 168 hours. No results for input(s): AMMONIA in the last 168 hours.  Coagulation Profile: No results for input(s): INR, PROTIME in the last 168 hours.  Cardiac Enzymes:  Recent Labs Lab 02/17/16 2259 02/18/16 0456 02/18/16 1019  TROPONINI 0.03* 0.04* 0.04*    BNP (last 3 results)  Recent Labs  01/27/16 1119  PROBNP 311.0*    HbA1C: No results for input(s): HGBA1C in the last 72 hours.  CBG:  Recent Labs Lab 02/18/16 1640 02/18/16 2038 02/19/16 0536 02/19/16 0755 02/19/16 1108  GLUCAP 167* 202* 119* 121* 195*    Lipid Profile: No results for input(s): CHOL, HDL, LDLCALC, TRIG, CHOLHDL, LDLDIRECT in the last 72 hours.  Thyroid Function Tests: No results for input(s): TSH, T4TOTAL, FREET4, T3FREE, THYROIDAB in the last 72 hours.  Anemia Panel: No results for input(s): VITAMINB12, FOLATE, FERRITIN, TIBC, IRON, RETICCTPCT in the last 72 hours.  Urine analysis:    Component Value Date/Time   COLORURINE YELLOW 02/17/2016 1819   APPEARANCEUR HAZY (A) 02/17/2016 1819   LABSPEC 1.030 02/17/2016 1819   PHURINE 5.0 02/17/2016 1819   GLUCOSEU NEGATIVE 02/17/2016 1819   HGBUR TRACE (A) 02/17/2016 1819   BILIRUBINUR NEGATIVE 02/17/2016 1819   KETONESUR NEGATIVE 02/17/2016 1819   PROTEINUR 100 (A) 02/17/2016 1819   NITRITE NEGATIVE 02/17/2016 1819   LEUKOCYTESUR NEGATIVE 02/17/2016 1819    Sepsis Labs: Lactic Acid, Venous    Component Value Date/Time   LATICACIDVEN 1.65 02/17/2016 2001    MICROBIOLOGY: Recent Results (from the past 240 hour(s))  Culture, blood (Routine x 2)     Status: None (Preliminary result)   Collection Time: 02/17/16  5:28 PM  Result Value Ref  Range Status   Specimen Description BLOOD RIGHT ANTECUBITAL  Final   Special Requests BOTTLES DRAWN AEROBIC AND ANAEROBIC 10CC  Final   Culture NO GROWTH < 24 HOURS  Final   Report Status PENDING  Incomplete  Urine culture     Status: None (Preliminary result)   Collection Time: 02/17/16  6:17 PM  Result Value Ref Range Status   Specimen Description URINE, RANDOM  Final   Special Requests NONE  Final   Culture CULTURE REINCUBATED FOR BETTER GROWTH  Final   Report Status PENDING  Incomplete  Culture, blood (Routine X 2) w Reflex to ID Panel     Status: None (Preliminary result)   Collection Time: 02/17/16  6:25 PM  Result Value Ref Range Status   Specimen Description BLOOD LEFT HAND  Final   Special Requests BOTTLES DRAWN AEROBIC AND ANAEROBIC 5CC EACH  Final   Culture NO GROWTH < 24 HOURS  Final   Report Status PENDING  Incomplete  Gram stain     Status: None   Collection Time: 02/18/16 11:49 AM  Result Value Ref Range Status   Specimen Description FLUID LEFT PLEURAL  Final   Special Requests NONE  Final   Gram Stain   Final    ABUNDANT WBC PRESENT,BOTH PMN AND MONONUCLEAR NO ORGANISMS SEEN    Report Status 02/18/2016 FINAL  Final    RADIOLOGY STUDIES/RESULTS: Dg Chest 1 View  Result Date: 01/30/2016 CLINICAL DATA:  Status post thoracentesis. EXAM: CHEST 1 VIEW COMPARISON:  Chest x-ray 01/27/2016 FINDINGS: The cardiac silhouette, mediastinal and hilar contours are within normal limits and stable. Tortuosity and calcification of the thoracic aorta. Biapical pleural thickening. Significant interval decrease in size of the left-sided pleural effusion. Small residual pleural effusion and overlying atelectasis. No postprocedural pneumothorax. IMPRESSION: Interval reduction in size of left-sided pleural effusion status post thoracentesis. No postprocedural pneumothorax. Electronically Signed   By: Marijo Sanes M.D.   On: 01/30/2016 10:20   Dg Chest 2 View  Result Date:  02/17/2016 CLINICAL DATA:  Fever, cough, and weakness for 1 week. EXAM: CHEST  2 VIEW COMPARISON:  02/10/2016 FINDINGS: A left chest tube remains in place. Improved left pleural effusion or thickening. Mild atelectasis or infiltration in the left base is also improved. Right lung is clear. Normal heart size and pulmonary vascularity. Calcified and tortuous aorta. No pneumothorax. IMPRESSION: Improved infiltration and effusion in the left base. Left chest tube remains place. Electronically Signed   By: Lucienne Capers M.D.   On: 02/17/2016 18:20   Dg Chest 2 View  Result Date: 02/10/2016 CLINICAL DATA:  Preop insertion of PleurX catheter left lung. Recurrent left pleural effusion. EXAM: CHEST  2 VIEW COMPARISON:  01/30/2016 FINDINGS: Large left pleural effusion, increased since prior study. Left base atelectasis. No focal opacity on the right. Heart is likely normal in size. No acute bony abnormality. IMPRESSION: Large left pleural effusion, increased since prior study. Left base atelectasis. Electronically Signed   By: Rolm Baptise M.D.   On: 02/10/2016 13:47   Dg Chest 2 View  Result Date: 01/27/2016 CLINICAL DATA:  Chronic shortness of breath and cough, worsening over the past 2 weeks. EXAM: CHEST  2 VIEW COMPARISON:  12/12/2015. FINDINGS: No significant change in a moderate-sized left pleural effusion with associated left lower lobe opacity. This is obscuring portions of the left heart border with no gross change in probable mild cardiac enlargement. The right lung remains clear. Thoracic spine degenerative changes. IMPRESSION: Stable left pleural effusion and associated left basilar atelectasis. Underlying pneumonia cannot be excluded. Electronically Signed   By: Claudie Revering M.D.   On: 01/27/2016 13:59   Ct Head Wo Contrast  Result Date: 02/17/2016 CLINICAL DATA:  Headache. EXAM: CT HEAD WITHOUT CONTRAST TECHNIQUE: Contiguous axial images were obtained from the base of the skull through  the vertex  without intravenous contrast. COMPARISON:  10/18/2014. FINDINGS: Brain: There is no evidence for acute hemorrhage, hydrocephalus, mass lesion, or abnormal extra-axial fluid collection. No definite CT evidence for acute infarction. Diffuse loss of parenchymal volume is consistent with atrophy. Encephalomalacia right occipital lobe is is again noted consistent with prior infarct. Lacunar infarcts are noted in the right basal ganglia. Vascular: Atherosclerotic calcification is visualized in the carotid arteries. No dense MCA sign. Major dural sinuses are unremarkable. Skull: No evidence for skull fracture Sinuses/Orbits: Visualized paranasal sinuses are clear. Right mastoid air cells are clear. The patient has developed a left mastoid effusion in the interval since the prior study with fluid visible in the left middle ear. Other: Unremarkable. IMPRESSION: 1. Interval development of left mastoid effusion with fluid visible in the left middle ear. 2. No acute intracranial abnormality. 3. Atrophy with chronic small vessel white matter ischemic disease and old right PCA territory infarct. Electronically Signed   By: Misty Stanley M.D.   On: 02/17/2016 19:13   Ct Chest Wo Contrast  Result Date: 02/18/2016 CLINICAL DATA:  Fever, cough, weakness. EXAM: CT CHEST WITHOUT CONTRAST TECHNIQUE: Multidetector CT imaging of the chest was performed following the standard protocol without IV contrast. COMPARISON:  11/11/2015. FINDINGS: Cardiovascular: Atherosclerotic calcification of the arterial vasculature, including three-vessel involvement of the coronary arteries. Heart is mildly enlarged. No pericardial effusion. Mediastinum/Nodes: Left thyroid nodule measures 2.3 cm, as before. Mediastinal lymph nodes are not enlarged by CT size criteria. Hilar regions are difficult to definitively evaluate without IV contrast. Lymph nodes adjacent to the descending thoracic aorta measure up to 1.3 cm on the left, stable. No axillary  adenopathy. Esophagus is grossly unremarkable. Lungs/Pleura: Mild patchy ground-glass in the lungs bilaterally, right greater than left, possibly due to expiratory phase imaging. Trace left pleural effusion with a small bore catheter in place. Airway is otherwise unremarkable. Upper Abdomen: Visualized portions of the liver, gallbladder are, adrenal glands unremarkable. Tiny stones in the right kidney. Visualized portions of the left kidney, spleen, pancreas, stomach and bowel are grossly unremarkable. Musculoskeletal: No worrisome lytic or sclerotic lesions. Degenerative changes are seen in the spine. IMPRESSION: 1. No findings to explain the patient's symptoms. 2. Trace left pleural effusion with small bore drainage catheter in place. 3. Lymph nodes adjacent to the descending thoracic aorta are stable. 4. Aortic atherosclerosis and three-vessel coronary artery calcification. 5. **An incidental finding of potential clinical significance has been found. Left thyroid nodule. Consider further evaluation with thyroid ultrasound. If patient is clinically hyperthyroid, consider nuclear medicine thyroid uptake and scan.** 6. Tiny right renal stones. Electronically Signed   By: Lorin Picket M.D.   On: 02/18/2016 13:24   Dg Chest Portable 1 View  Result Date: 02/10/2016 CLINICAL DATA:  Status post left Pleurex catheter insertion. EXAM: PORTABLE CHEST 1 VIEW COMPARISON:  02/10/2016 FINDINGS: Cardiomediastinal silhouette is normal. Mediastinal contours appear intact. There is no evidence of pneumothorax. There has been insertion of left chest tube with interval decrease in the known left pleural effusion. Low lung volumes with crowding of the interstitial markings. Osseous structures are without acute abnormality. Soft tissues are grossly normal. IMPRESSION: Status post left chest tube placement with decrease in left pleural effusion volume. Low lung volume. No evidence of pneumothorax. Electronically Signed   By:  Fidela Salisbury M.D.   On: 02/10/2016 16:57   US Thoracentesis Asp Pleural Space W/img Guide  Result Date: 01/30/2016 INDICATION: Recurrent left pleural effusion. Request diagnostic and therapeutic thoracentesis.  EXAM: ULTRASOUND GUIDED LEFT THORACENTESIS MEDICATIONS: None. COMPLICATIONS: None immediate. PROCEDURE: An ultrasound guided thoracentesis was thoroughly discussed with the patient and questions answered. The benefits, risks, alternatives and complications were also discussed. The patient understands and wishes to proceed with the procedure. Written consent was obtained. Ultrasound was performed to localize and mark an adequate pocket of fluid in the left chest. The area was then prepped and draped in the normal sterile fashion. 1% Lidocaine was used for local anesthesia. Under ultrasound guidance a Safe-T-Centesis catheter was introduced. Thoracentesis was performed. The catheter was removed and a dressing applied. FINDINGS: A total of approximately 2.2 L of hazy, amber colored fluid was removed. Samples were sent to the laboratory as requested by the clinical team. IMPRESSION: Successful ultrasound guided left thoracentesis yielding 2.2 L of pleural fluid. Read by: Ascencion Dike PA-C Electronically Signed   By: Aletta Edouard M.D.   On: 01/30/2016 10:14     LOS: 2 days   Oren Binet, MD  [facility] Pager:[phone]  If 7PM-7AM, please contact night-coverage [email or web address] Password [facility] 02/19/2016, 12:52 PM

## 2016-02-20 DIAGNOSIS — R51 Headache: Secondary | ICD-10-CM

## 2016-02-20 LAB — CBC
HCT: 35 % — ABNORMAL LOW (ref 39.0–52.0)
Hemoglobin: 10.6 g/dL — ABNORMAL LOW (ref 13.0–17.0)
MCH: 24.4 pg — AB (ref 26.0–34.0)
MCHC: 30.3 g/dL (ref 30.0–36.0)
MCV: 80.6 fL (ref 78.0–100.0)
PLATELETS: 56 10*3/uL — AB (ref 150–400)
RBC: 4.34 MIL/uL (ref 4.22–5.81)
RDW: 23.3 % — ABNORMAL HIGH (ref 11.5–15.5)
WBC: 6.4 10*3/uL (ref 4.0–10.5)

## 2016-02-20 LAB — GLUCOSE, CAPILLARY
GLUCOSE-CAPILLARY: 130 mg/dL — AB (ref 65–99)
GLUCOSE-CAPILLARY: 154 mg/dL — AB (ref 65–99)
GLUCOSE-CAPILLARY: 255 mg/dL — AB (ref 65–99)
Glucose-Capillary: 142 mg/dL — ABNORMAL HIGH (ref 65–99)

## 2016-02-20 LAB — PATHOLOGIST SMEAR REVIEW: Path Review: REACTIVE

## 2016-02-20 MED ORDER — GABAPENTIN 300 MG PO CAPS
300.0000 mg | ORAL_CAPSULE | Freq: Two times a day (BID) | ORAL | Status: DC
  Administered 2016-02-20 (×2): 300 mg via ORAL
  Filled 2016-02-20 (×2): qty 1

## 2016-02-20 NOTE — Evaluation (Signed)
Physical Therapy Evaluation Patient Details Name: Ricardo FRANCKE MRN: [id number] DOB: 05/28/1931 Today's Date: 02/20/2016   History of Present Illness  Patient is an 82 yo male admitted 02/17/16 with fever, SOB, cough, headache.  Patient with sepsis.    PMH:  Myelodysplastic syndrome, recurrent pleural effusion with Pleurx catheter placed 02/10/16, CVA, DM, CKD, headaches, HTN, CAD, Afib    Clinical Impression  Patient presents with problems listed below.  Will benefit from acute PT to maximize functional mobility prior to discharge home with family.  Patient with significant balance deficits, impacting gait and safety.  Recommend f/u HHPT at discharge for mobility/balance training.    Follow Up Recommendations Home health PT;Supervision for mobility/OOB    Equipment Recommendations  None recommended by PT    Recommendations for Other Services       Precautions / Restrictions Precautions Precautions: Fall (Moderate fall risk) Precaution Comments: Patient leans/drifts to right during gait Restrictions Weight Bearing Restrictions: No      Mobility  Bed Mobility Overal bed mobility: Modified Independent                Transfers Overall transfer level: Needs assistance Equipment used: Rolling walker (2 wheeled) Transfers: Sit to/from Stand Sit to Stand: Min guard         General transfer comment: Verbal cues for hand placement.  Assist for safety/balance.  Ambulation/Gait Ambulation/Gait assistance: Min assist Ambulation Distance (Feet): 82 Feet Assistive device: Rolling walker (2 wheeled) Gait Pattern/deviations: Step-to pattern;Decreased stance time - right;Decreased step length - left;Decreased step length - right;Decreased stride length;Ataxic;Staggering right;Trunk flexed Gait velocity: decreased Gait velocity interpretation: Below normal speed for age/gender General Gait Details: Verbal cues for safe use of RW.  Cues to keep feet inside of RW.  Patient  drifting/leaning to right significantly at first, with feet to right of RW.  Physical assist to stop and move inside RW, and to prevent fall.  Assist during gait to maneuver RW during turns, and for balance.  Drift/leaning to right improved by end of gait.  Stairs            Wheelchair Mobility    Modified Rankin (Stroke Patients Only)       Balance Overall balance assessment: Needs assistance Sitting-balance support: No upper extremity supported;Feet supported Sitting balance-Leahy Scale: Good     Standing balance support: Bilateral upper extremity supported Standing balance-Leahy Scale: Poor Standing balance comment: Leaning/drifting to right with gait                             Pertinent Vitals/Pain Pain Assessment: No/denies pain    Home Living Family/patient expects to be discharged to:: Private residence Living Arrangements: Spouse/significant other;Children (Son) Available Help at Discharge: Family;Available 24 hours/day (Wife and son live with pt; Daughter and 3 sons live nearby) Type of Home: House Home Access: Ramped entrance     Home Layout: One level Home Equipment: Environmental consultant - 2 wheels;Cane - single point;Bedside commode;Shower seat      Prior Function Level of Independence: Independent with assistive device(s);Needs assistance   Gait / Transfers Assistance Needed: Uses cane "all the time" for gait  ADL's / Homemaking Assistance Needed: Wife assists with bathing, dressing, meal prep, housekeeping  Comments: Patient drives     Hand Dominance        Extremity/Trunk Assessment   Upper Extremity Assessment: RUE deficits/detail RUE Deficits / Details: RUE slightly weaker than Lt, at 4/5 - from prior  CVA         Lower Extremity Assessment: RLE deficits/detail RLE Deficits / Details: Decreased strength and coordination compared with LLE.  Grossly 4/5       Communication   Communication: No difficulties  Cognition  Arousal/Alertness: Awake/alert Behavior During Therapy: WFL for tasks assessed/performed Overall Cognitive Status: Within Functional Limits for tasks assessed                      General Comments      Exercises        Assessment/Plan    PT Assessment Patient needs continued PT services  PT Diagnosis Difficulty walking;Abnormality of gait (Rt-sided weakness from prior CVA)   PT Problem List Decreased strength;Decreased balance;Decreased mobility;Decreased coordination;Decreased knowledge of use of DME  PT Treatment Interventions DME instruction;Gait training;Functional mobility training;Therapeutic activities;Balance training;Patient/family education   PT Goals (Current goals can be found in the Care Plan section) Acute Rehab PT Goals Patient Stated Goal: To go home soon PT Goal Formulation: With patient Time For Goal Achievement: 02/27/16 Potential to Achieve Goals: Good    Frequency Min 3X/week   Barriers to discharge        Co-evaluation               End of Session Equipment Utilized During Treatment: Gait belt Activity Tolerance: Patient tolerated treatment well Patient left: in bed;with call bell/phone within reach;with bed alarm set Nurse Communication: Mobility status         Time: [id number] PT Time Calculation (min) (ACUTE ONLY): 17 min   Charges:   PT Evaluation $PT Eval Moderate Complexity: 1 Procedure     PT G CodesDespina Pole 02/25/16, 7:46 PM Carita Pian. Sanjuana Kava, [facility] Pager [phone]

## 2016-02-20 NOTE — Progress Notes (Addendum)
PROGRESS NOTE        PATIENT DETAILS Name: Ricardo Watkins Age: 82 y.o. Sex: male Date of Birth: 1931-02-02 Admit Date: 02/17/2016 Admitting Physician Norval Morton, MD [id number] TOM, MD  Brief Narrative: Patient is a 82 y.o. male myelodysplastic syndrome, thrombocytopenia secondary to MDS, recurrent exudative pleural effusion status post Pleurx catheter placement on 8/22, admitted with several days history of fever, shortness of breath, cough and headache.  Subjective: Continues to improve,  less left ear pain and headache today. Lying comfortably in the bed when I walked in this morning.  Assessment/Plan: Active Problems: Sepsis secondary to a unknown source: Sepsis pathophysiology has resolved. Foci of infection is not apparent-UA/CT chest negative for infection. Does have probable otitis media-but not sure if this can account for this degree of infection. Blood cultures and pleural fluid cultures are negative so far. Although did have headache on admission, no meningeal signs and felt not to have meningitis. Furthermore, patient has severe thrombocytopenia and is on Plavix, and hence lumbar puncture was not pursued. Since he he has improved, he has been narrowed down to just a Unasyn, plans are to monitor another 24 hours-if he continues to do well then he could be transitioned to Augmentin    Recurrent left-sided pleural effusion: Exudative by light's criteria-underwent a recent left Pleurx catheter placement on 8/22. Catheter site that any obvious infection-pleural fluid cell count shows increased WBC with predominant lymphocytes. CT chest without any pneumonia. Pleural fluid cultures negative so far.  Left ear pain: Likely secondary to otitis media-on otoscopic exam-some wax present in the canal-but see no drainage in the external canal-visualized portion of the tympanic membrane appears red and inflamed,-I do not she see any obvious perforation. Case  was discussed with ENT-Dr Melene Plan on 8/31-who recommended that we continue with broad-spectrum antibiotics-he suggested that we use either clindamycin or Unasyn for this particular indication. He reviewed the CT of the head, and did not think the mastoid effusion was anything significant. Will need outpatient follow-up with ENT post discharge.  History of myelodysplastic syndrome and related chronic thrombocytopenia: Followed by hematology ([facility])-continue Promacta. Platelet count stable and close to usual baseline.  History of CAD status post remote PCI: Cautiously continue with Plavix given thrombocytopenia (seems to be tolerating Plavix well with platelet count around 50 K). Continue metoprolol.  Prior history of CVA: Nonfocal exam, continue Plavix-if platelet counts decrease- further may need to stop  Type 2 diabetes: CBGs stable, continue with SSI and 5 units of Lantus. Recent A1c 6.7 on 12/29/15  Chronic kidney disease stage III: Creatinine close to usual baseline and follow.  History of chronic intermittent headaches: Supportive care-claims that his headaches are usually not as severe as on initial presentation to the emergency room. Not sure if worsening headaches is secondary to otitis media/fluid in the mass left mastoid seen on CT scan. Antibiotics as above, follow clinical course for now as headaches are improved.  Hyponatremia: Resolved, follow electrolytes.  Hypertension: Controlled, continue losartan, metoprolol and Cardura.  Remote history of follicular lymphoma  DVT Prophylaxis: SCD's  Code Status: Full code  Family Communication: None at bedside  Disposition Plan: Remain inpatient-home 9/2 if clinical improvement continues  Antimicrobial agents: IV vancomycin 8/29>>8/31 IV cefepime 8/29>>8/31 IV ampicillin 8/28>>8/31 IV Unasyn 8/31>>  Procedures: None  CONSULTS:  None  Time spent: 25 minutes-Greater than 50%  of this time was spent in counseling, explanation  of diagnosis, planning of further management, and coordination of care.  MEDICATIONS: Anti-infectives    Start     Dose/Rate Route Frequency Ordered Stop   02/19/16 1830  Ampicillin-Sulbactam (UNASYN) 3 g in sodium chloride 0.9 % 100 mL IVPB     3 g 100 mL/hr over 60 Minutes Intravenous Every 8 hours 02/19/16 1311     02/18/16 1700  vancomycin (VANCOCIN) 1,250 mg in sodium chloride 0.9 % 250 mL IVPB  Status:  Discontinued     1,250 mg 166.7 mL/hr over 90 Minutes Intravenous Every 24 hours 02/17/16 1853 02/18/16 1008   02/18/16 1400  vancomycin (VANCOCIN) 1,250 mg in sodium chloride 0.9 % 250 mL IVPB  Status:  Discontinued     1,250 mg 166.7 mL/hr over 90 Minutes Intravenous Every 24 hours 02/18/16 1008 02/19/16 1257   02/18/16 0300  piperacillin-tazobactam (ZOSYN) IVPB 3.375 g  Status:  Discontinued     3.375 g 12.5 mL/hr over 240 Minutes Intravenous Every 8 hours 02/17/16 1853 02/17/16 2211   02/17/16 2215  ampicillin (OMNIPEN) 2 g in sodium chloride 0.9 % 50 mL IVPB  Status:  Discontinued     2 g 150 mL/hr over 20 Minutes Intravenous Every 6 hours 02/17/16 2211 02/19/16 1257   02/17/16 2215  ceFEPIme (MAXIPIME) 2 g in dextrose 5 % 50 mL IVPB  Status:  Discontinued     2 g 100 mL/hr over 30 Minutes Intravenous Every 12 hours 02/17/16 2211 02/19/16 1257   02/17/16 1845  piperacillin-tazobactam (ZOSYN) IVPB 3.375 g     3.375 g 100 mL/hr over 30 Minutes Intravenous  Once 02/17/16 1834 02/17/16 1939   02/17/16 1845  vancomycin (VANCOCIN) IVPB 1000 mg/200 mL premix     1,000 mg 200 mL/hr over 60 Minutes Intravenous  Once 02/17/16 1834 02/17/16 2008      Scheduled Meds: . ampicillin-sulbactam (UNASYN) IV  3 g Intravenous Q8H  . calcium gluconate  1 g Intravenous Once  . clopidogrel  75 mg Oral Daily  . doxazosin  4 mg Oral Daily  . eltrombopag  50 mg Oral Daily  . feeding supplement (ENSURE ENLIVE)  237 mL Oral Q24H  . ferrous sulfate  325 mg Oral Q breakfast  . gabapentin  300 mg  Oral BID  . insulin aspart  0-9 Units Subcutaneous TID WC  . insulin glargine  5 Units Subcutaneous QHS  . irbesartan  150 mg Oral Daily  . metoCLOPramide  5 mg Oral TID AC  . metoprolol succinate  100 mg Oral Daily  . multivitamin with minerals  1 tablet Oral Daily  . potassium chloride SA  20 mEq Oral BID   Continuous Infusions:  PRN Meds:.acetaminophen **OR** acetaminophen, albuterol, HYDROcodone-acetaminophen, ipratropium, meclizine, ondansetron **OR** ondansetron (ZOFRAN) IV   PHYSICAL EXAM: Vital signs: Vitals:   02/19/16 1222 02/19/16 2055 02/20/16 0552 02/20/16 0954  BP: (!) 116/57 (!) 144/70 109/61 130/62  Pulse: 88 93  (!) 103  Resp: 16 15 16 16   Temp: 98.1 F (36.7 C)  98.8 F (37.1 C) 97.7 F (36.5 C)  TempSrc: Oral  Oral   SpO2: 96% 97% 95% 97%  Weight:   82.6 kg (182 lb 1.6 oz)   Height:       Filed Weights   02/18/16 0430 02/19/16 0547 02/20/16 0552  Weight: 84.2 kg (185 lb 11.2 oz) 82.2 kg (181 lb 3.2 oz) 82.6 kg (182 lb 1.6 oz)   Body  mass index is 27.69 kg/m.   Gen Exam: Awake and alert with clear speech. Not in any distress  Neck: Supple, No JVD.  No meningeal signs. Otoscopic examOn 8/31: Left side-some wax present in the external canal, but no discharge seen, visualized portion of the tympanic membrane inflamed. No perforation seen. Right side appeared without any significant abnormalities except for some wax the external auditory canal. Chest: B/L Clear.   CVS: S1 S2 Regular, no murmurs.  Abdomen: soft, BS +, non tender, non distended.  Extremities: no edema, lower extremities warm to touch. Neurologic: Non Focal.   Skin: No Rash or lesions   Wounds: N/A.    I have personally reviewed following labs and imaging studies  LABORATORY DATA: CBC:  Recent Labs Lab 02/17/16 1722 02/18/16 0456 02/19/16 0454 02/20/16 0412  WBC 9.2 8.6 5.8 6.4  NEUTROABS 5.5  --   --   --   HGB 11.5* 9.7* 9.8* 10.6*  HCT 38.5* 32.1* 33.5* 35.0*  MCV 79.9 79.1  81.1 80.6  PLT 45* 44* 50* 56*    Basic Metabolic Panel:  Recent Labs Lab 02/17/16 1722 02/17/16 2259 02/18/16 0456 02/19/16 0454  NA 132*  --  134* 136  K 3.9  --  3.9 4.1  CL 99*  --  103 105  CO2 25  --  25 28  GLUCOSE 143*  --  122* 127*  BUN 20  --  20 18  CREATININE 1.70*  --  1.68* 1.55*  CALCIUM 8.3*  --  7.9* 8.2*  MG  --  1.7  --   --     GFR: Estimated Creatinine Clearance: 37.8 mL/min (by C-G formula based on SCr of 1.55 mg/dL).  Liver Function Tests:  Recent Labs Lab 02/17/16 1722 02/18/16 0456  AST 22 19  ALT 20 17  ALKPHOS 63 52  BILITOT 0.2* 0.6  PROT 8.2* 6.7  ALBUMIN 2.8* 2.4*   No results for input(s): LIPASE, AMYLASE in the last 168 hours. No results for input(s): AMMONIA in the last 168 hours.  Coagulation Profile: No results for input(s): INR, PROTIME in the last 168 hours.  Cardiac Enzymes:  Recent Labs Lab 02/17/16 2259 02/18/16 0456 02/18/16 1019  TROPONINI 0.03* 0.04* 0.04*    BNP (last 3 results)  Recent Labs  01/27/16 1119  PROBNP 311.0*    HbA1C: No results for input(s): HGBA1C in the last 72 hours.  CBG:  Recent Labs Lab 02/19/16 0755 02/19/16 1108 02/19/16 1622 02/19/16 2038 02/20/16 0529  GLUCAP 121* 195* 228* 175* 130*    Lipid Profile: No results for input(s): CHOL, HDL, LDLCALC, TRIG, CHOLHDL, LDLDIRECT in the last 72 hours.  Thyroid Function Tests: No results for input(s): TSH, T4TOTAL, FREET4, T3FREE, THYROIDAB in the last 72 hours.  Anemia Panel: No results for input(s): VITAMINB12, FOLATE, FERRITIN, TIBC, IRON, RETICCTPCT in the last 72 hours.  Urine analysis:    Component Value Date/Time   COLORURINE YELLOW 02/17/2016 1819   APPEARANCEUR HAZY (A) 02/17/2016 1819   LABSPEC 1.030 02/17/2016 1819   PHURINE 5.0 02/17/2016 1819   GLUCOSEU NEGATIVE 02/17/2016 1819   HGBUR TRACE (A) 02/17/2016 1819   BILIRUBINUR NEGATIVE 02/17/2016 1819   KETONESUR NEGATIVE 02/17/2016 1819   PROTEINUR  100 (A) 02/17/2016 1819   NITRITE NEGATIVE 02/17/2016 1819   LEUKOCYTESUR NEGATIVE 02/17/2016 1819    Sepsis Labs: Lactic Acid, Venous    Component Value Date/Time   LATICACIDVEN 1.65 02/17/2016 2001    MICROBIOLOGY: Recent Results (from  the past 240 hour(s))  Culture, blood (Routine x 2)     Status: None (Preliminary result)   Collection Time: 02/17/16  5:28 PM  Result Value Ref Range Status   Specimen Description BLOOD RIGHT ANTECUBITAL  Final   Special Requests BOTTLES DRAWN AEROBIC AND ANAEROBIC 10CC  Final   Culture NO GROWTH 2 DAYS  Final   Report Status PENDING  Incomplete  Urine culture     Status: None (Preliminary result)   Collection Time: 02/17/16  6:17 PM  Result Value Ref Range Status   Specimen Description URINE, RANDOM  Final   Special Requests NONE  Final   Culture CULTURE REINCUBATED FOR BETTER GROWTH  Final   Report Status PENDING  Incomplete  Culture, blood (Routine X 2) w Reflex to ID Panel     Status: None (Preliminary result)   Collection Time: 02/17/16  6:25 PM  Result Value Ref Range Status   Specimen Description BLOOD LEFT HAND  Final   Special Requests BOTTLES DRAWN AEROBIC AND ANAEROBIC 5CC EACH  Final   Culture NO GROWTH 2 DAYS  Final   Report Status PENDING  Incomplete  Culture, body fluid-bottle     Status: None (Preliminary result)   Collection Time: 02/18/16 11:49 AM  Result Value Ref Range Status   Specimen Description FLUID LEFT PLEURAL  Final   Special Requests NONE  Final   Culture NO GROWTH 1 DAY  Final   Report Status PENDING  Incomplete  Gram stain     Status: None   Collection Time: 02/18/16 11:49 AM  Result Value Ref Range Status   Specimen Description FLUID LEFT PLEURAL  Final   Special Requests NONE  Final   Gram Stain   Final    ABUNDANT WBC PRESENT,BOTH PMN AND MONONUCLEAR NO ORGANISMS SEEN    Report Status 02/18/2016 FINAL  Final    RADIOLOGY STUDIES/RESULTS: Dg Chest 1 View  Result Date: 01/30/2016 CLINICAL DATA:   Status post thoracentesis. EXAM: CHEST 1 VIEW COMPARISON:  Chest x-ray 01/27/2016 FINDINGS: The cardiac silhouette, mediastinal and hilar contours are within normal limits and stable. Tortuosity and calcification of the thoracic aorta. Biapical pleural thickening. Significant interval decrease in size of the left-sided pleural effusion. Small residual pleural effusion and overlying atelectasis. No postprocedural pneumothorax. IMPRESSION: Interval reduction in size of left-sided pleural effusion status post thoracentesis. No postprocedural pneumothorax. Electronically Signed   By: Marijo Sanes M.D.   On: 01/30/2016 10:20   Dg Chest 2 View  Result Date: 02/17/2016 CLINICAL DATA:  Fever, cough, and weakness for 1 week. EXAM: CHEST  2 VIEW COMPARISON:  02/10/2016 FINDINGS: A left chest tube remains in place. Improved left pleural effusion or thickening. Mild atelectasis or infiltration in the left base is also improved. Right lung is clear. Normal heart size and pulmonary vascularity. Calcified and tortuous aorta. No pneumothorax. IMPRESSION: Improved infiltration and effusion in the left base. Left chest tube remains place. Electronically Signed   By: Lucienne Capers M.D.   On: 02/17/2016 18:20   Dg Chest 2 View  Result Date: 02/10/2016 CLINICAL DATA:  Preop insertion of PleurX catheter left lung. Recurrent left pleural effusion. EXAM: CHEST  2 VIEW COMPARISON:  01/30/2016 FINDINGS: Large left pleural effusion, increased since prior study. Left base atelectasis. No focal opacity on the right. Heart is likely normal in size. No acute bony abnormality. IMPRESSION: Large left pleural effusion, increased since prior study. Left base atelectasis. Electronically Signed   By: Rolm Baptise M.D.  On: 02/10/2016 13:47   Dg Chest 2 View  Result Date: 01/27/2016 CLINICAL DATA:  Chronic shortness of breath and cough, worsening over the past 2 weeks. EXAM: CHEST  2 VIEW COMPARISON:  12/12/2015. FINDINGS: No  significant change in a moderate-sized left pleural effusion with associated left lower lobe opacity. This is obscuring portions of the left heart border with no gross change in probable mild cardiac enlargement. The right lung remains clear. Thoracic spine degenerative changes. IMPRESSION: Stable left pleural effusion and associated left basilar atelectasis. Underlying pneumonia cannot be excluded. Electronically Signed   By: Claudie Revering M.D.   On: 01/27/2016 13:59   Ct Head Wo Contrast  Result Date: 02/17/2016 CLINICAL DATA:  Headache. EXAM: CT HEAD WITHOUT CONTRAST TECHNIQUE: Contiguous axial images were obtained from the base of the skull through the vertex without intravenous contrast. COMPARISON:  10/18/2014. FINDINGS: Brain: There is no evidence for acute hemorrhage, hydrocephalus, mass lesion, or abnormal extra-axial fluid collection. No definite CT evidence for acute infarction. Diffuse loss of parenchymal volume is consistent with atrophy. Encephalomalacia right occipital lobe is is again noted consistent with prior infarct. Lacunar infarcts are noted in the right basal ganglia. Vascular: Atherosclerotic calcification is visualized in the carotid arteries. No dense MCA sign. Major dural sinuses are unremarkable. Skull: No evidence for skull fracture Sinuses/Orbits: Visualized paranasal sinuses are clear. Right mastoid air cells are clear. The patient has developed a left mastoid effusion in the interval since the prior study with fluid visible in the left middle ear. Other: Unremarkable. IMPRESSION: 1. Interval development of left mastoid effusion with fluid visible in the left middle ear. 2. No acute intracranial abnormality. 3. Atrophy with chronic small vessel white matter ischemic disease and old right PCA territory infarct. Electronically Signed   By: Misty Stanley M.D.   On: 02/17/2016 19:13   Ct Chest Wo Contrast  Result Date: 02/18/2016 CLINICAL DATA:  Fever, cough, weakness. EXAM: CT  CHEST WITHOUT CONTRAST TECHNIQUE: Multidetector CT imaging of the chest was performed following the standard protocol without IV contrast. COMPARISON:  11/11/2015. FINDINGS: Cardiovascular: Atherosclerotic calcification of the arterial vasculature, including three-vessel involvement of the coronary arteries. Heart is mildly enlarged. No pericardial effusion. Mediastinum/Nodes: Left thyroid nodule measures 2.3 cm, as before. Mediastinal lymph nodes are not enlarged by CT size criteria. Hilar regions are difficult to definitively evaluate without IV contrast. Lymph nodes adjacent to the descending thoracic aorta measure up to 1.3 cm on the left, stable. No axillary adenopathy. Esophagus is grossly unremarkable. Lungs/Pleura: Mild patchy ground-glass in the lungs bilaterally, right greater than left, possibly due to expiratory phase imaging. Trace left pleural effusion with a small bore catheter in place. Airway is otherwise unremarkable. Upper Abdomen: Visualized portions of the liver, gallbladder are, adrenal glands unremarkable. Tiny stones in the right kidney. Visualized portions of the left kidney, spleen, pancreas, stomach and bowel are grossly unremarkable. Musculoskeletal: No worrisome lytic or sclerotic lesions. Degenerative changes are seen in the spine. IMPRESSION: 1. No findings to explain the patient's symptoms. 2. Trace left pleural effusion with small bore drainage catheter in place. 3. Lymph nodes adjacent to the descending thoracic aorta are stable. 4. Aortic atherosclerosis and three-vessel coronary artery calcification. 5. **An incidental finding of potential clinical significance has been found. Left thyroid nodule. Consider further evaluation with thyroid ultrasound. If patient is clinically hyperthyroid, consider nuclear medicine thyroid uptake and scan.** 6. Tiny right renal stones. Electronically Signed   By: Lorin Picket M.D.   On: 02/18/2016  13:24   Dg Chest Portable 1 View  Result  Date: 02/10/2016 CLINICAL DATA:  Status post left Pleurex catheter insertion. EXAM: PORTABLE CHEST 1 VIEW COMPARISON:  02/10/2016 FINDINGS: Cardiomediastinal silhouette is normal. Mediastinal contours appear intact. There is no evidence of pneumothorax. There has been insertion of left chest tube with interval decrease in the known left pleural effusion. Low lung volumes with crowding of the interstitial markings. Osseous structures are without acute abnormality. Soft tissues are grossly normal. IMPRESSION: Status post left chest tube placement with decrease in left pleural effusion volume. Low lung volume. No evidence of pneumothorax. Electronically Signed   By: Fidela Salisbury M.D.   On: 02/10/2016 16:57   US Thoracentesis Asp Pleural Space W/img Guide  Result Date: 01/30/2016 INDICATION: Recurrent left pleural effusion. Request diagnostic and therapeutic thoracentesis. EXAM: ULTRASOUND GUIDED LEFT THORACENTESIS MEDICATIONS: None. COMPLICATIONS: None immediate. PROCEDURE: An ultrasound guided thoracentesis was thoroughly discussed with the patient and questions answered. The benefits, risks, alternatives and complications were also discussed. The patient understands and wishes to proceed with the procedure. Written consent was obtained. Ultrasound was performed to localize and mark an adequate pocket of fluid in the left chest. The area was then prepped and draped in the normal sterile fashion. 1% Lidocaine was used for local anesthesia. Under ultrasound guidance a Safe-T-Centesis catheter was introduced. Thoracentesis was performed. The catheter was removed and a dressing applied. FINDINGS: A total of approximately 2.2 L of hazy, amber colored fluid was removed. Samples were sent to the laboratory as requested by the clinical team. IMPRESSION: Successful ultrasound guided left thoracentesis yielding 2.2 L of pleural fluid. Read by: Ascencion Dike PA-C Electronically Signed   By: Aletta Edouard M.D.   On:  01/30/2016 10:14     LOS: 3 days   Oren Binet, MD  [facility] Pager:[phone]  If 7PM-7AM, please contact night-coverage [email or web address] Password [email or web address] 02/20/2016, 10:37 AM

## 2016-02-20 NOTE — Care Management Important Message (Signed)
Important Message  Patient Details  Name: Ricardo Watkins MRN: [id number] Date of Birth: 10-Aug-1930   Medicare Important Message Given:  Yes    Mikisha Roseland Montine Circle 02/20/2016, 10:28 AM

## 2016-02-20 NOTE — Progress Notes (Signed)
  Subjective: Feeling better L Pleurx cath draining 800-1000cc each time Last CXR clear Catheter wo sign of infection  Objective: Vital signs in last 24 hours: Temp:  [97.7 F (36.5 C)-98.8 F (37.1 C)] 98.4 F (36.9 C) (09/01 1236) Pulse Rate:  [92-103] 92 (09/01 1236) Cardiac Rhythm: Normal sinus rhythm (09/01 0955) Resp:  [15-18] 18 (09/01 1236) BP: (109-144)/(61-70) 136/69 (09/01 1236) SpO2:  [95 %-98 %] 98 % (09/01 1236) Weight:  [182 lb 1.6 oz (82.6 kg)] 182 lb 1.6 oz (82.6 kg) (09/01 0552)  Hemodynamic parameters for last 24 hours:  stable  Intake/Output from previous day: 08/31 0701 - 09/01 0700 In: 700 [P.O.:600; IV Piggyback:100] Out: 977 [Urine:976; Stool:1] Intake/Output this shift: Total I/O In: 460 [P.O.:460] Out: 250 [Urine:250]  Cath site clean, dry Clear breath sounds on L  Lab Results:  Recent Labs  02/19/16 0454 02/20/16 0412  WBC 5.8 6.4  HGB 9.8* 10.6*  HCT 33.5* 35.0*  PLT 50* 56*   BMET:  Recent Labs  02/18/16 0456 02/19/16 0454  NA 134* 136  K 3.9 4.1  CL 103 105  CO2 25 28  GLUCOSE 122* 127*  BUN 20 18  CREATININE 1.68* 1.55*  CALCIUM 7.9* 8.2*    PT/INR: No results for input(s): LABPROT, INR in the last 72 hours. ABG No results found for: PHART, HCO3, TCO2, ACIDBASEDEF, O2SAT CBG (last 3)   Recent Labs  02/19/16 2038 02/20/16 0529 02/20/16 1116  GLUCAP 175* 130* 154*    Assessment/Plan: S/P  cont current L Pleurx catheter care, drainage   LOS: 3 days    Ricardo Watkins 02/20/2016

## 2016-02-21 DIAGNOSIS — G4440 Drug-induced headache, not elsewhere classified, not intractable: Secondary | ICD-10-CM

## 2016-02-21 DIAGNOSIS — G4489 Other headache syndrome: Secondary | ICD-10-CM

## 2016-02-21 LAB — URINE CULTURE: Culture: >=100000 — AB

## 2016-02-21 LAB — GLUCOSE, CAPILLARY
GLUCOSE-CAPILLARY: 193 mg/dL — AB (ref 65–99)
Glucose-Capillary: 125 mg/dL — ABNORMAL HIGH (ref 65–99)

## 2016-02-21 MED ORDER — INSULIN GLARGINE 100 UNIT/ML SOLOSTAR PEN: PEN_INJECTOR | SUBCUTANEOUS | Status: DC

## 2016-02-21 MED ORDER — AMOXICILLIN-POT CLAVULANATE 875-125 MG PO TABS: 1.0000 | ORAL_TABLET | Freq: Two times a day (BID) | ORAL | 0 refills | Status: DC

## 2016-02-21 MED ORDER — GABAPENTIN 300 MG PO CAPS
300.0000 mg | ORAL_CAPSULE | Freq: Three times a day (TID) | ORAL | Status: DC
  Administered 2016-02-21: 300 mg via ORAL
  Filled 2016-02-21: qty 1

## 2016-02-21 MED ORDER — FERROUS SULFATE 325 (65 FE) MG PO TABS
325.0000 mg | ORAL_TABLET | Freq: Every day | ORAL | Status: DC
  Administered 2016-02-21: 325 mg via ORAL
  Filled 2016-02-21: qty 1

## 2016-02-21 NOTE — Discharge Summary (Addendum)
PATIENT DETAILS Name: Ricardo Watkins Age: 82 y.o. Sex: male Date of Birth: 1931/03/16 MRN: [id number]. Admitting Physician: Ricardo Morton, MD [id number] TOM, MD  Admit Date: 02/17/2016 Discharge date: 02/21/2016  Recommendations for Outpatient Follow-up:  1. Follow up with PCP in 1-2 weeks 2. Please obtain BMP/CBC in one week 3. Left Thyroid Nodule seen incidentally on CT Chest-further work up deferred to PCP 4. Please follow up on the following pending results: Blood cultures until final  Admitted From:  Home  Disposition: Home with home health services   [place]: Yes  Equipment/Devices:  Pleurx catheter-left-sided-already in place prior to this admission.  Discharge Condition: Stable  CODE STATUS: FULL CODE  Diet recommendation:  Heart Healthy / Carb Modified   Brief Summary: See H&P, Labs, Consult and Test reports for all details in brief, Patient is a 82 y.o. male myelodysplastic syndrome, thrombocytopenia secondary to MDS, recurrent exudative pleural effusion status post Pleurx catheter placement on 8/22, admitted with several days history of fever, shortness of breath, cough and headache.Admitted and started on empiric Abx-sepsis pathophysiology has resolved. See below for further details.  Brief Hospital Course: Sepsis secondary to a unknown source: Sepsis pathophysiology has resolved. Foci of infection is not apparent-UA/CT chest negative for infection. Does have probable otitis media-but not sure if this can account for this degree of infection. Blood cultures and pleural fluid cultures are negative so far. Although urine cultures are positive, do not think he had a UTI-as UA was unremarkable. Although did have headache on admission, no meningeal signs and felt not to have meningitis. Furthermore, patient has severe thrombocytopenia and is on Plavix, and hence lumbar puncture was not pursued-he was covered with empiric broad spectrum Abx-since he  improved, he has been narrowed down to just  Unasyn-since he continues to improve, he will be transitioned to Augmentin on discharge.  History of chronic intermittent headaches: Claims that his headaches are usually not as severe as on initial presentation to the emergency room. Headache did improve over the course of this hospital stay, but still not back to his baseline. Not sure if worsening headaches is secondary to otitis media/fluid in the mass left mastoid seen on CT scan. Seen by neurology, felt to have slightly worsening headaches due to his acute illness, and felt that with continued supportive care and as needed Tylenol, that headaches would return back to his usual pattern. No further recommendations from neurology at this time.  Recurrent left-sided pleural effusion: Exudative by light's criteria-underwent a recent left Pleurx catheter placement on 8/22. Catheter site that any obvious infection-pleural fluid cell count shows increased WBC with predominant lymphocytes. CT chest without any pneumonia. Pleural fluid cultures negative.  Left ear pain: Likely secondary to otitis media-on otoscopic exam-some wax present in the canal-but see no drainage in the external canal-visualized portion of the tympanic membrane appears red and inflamed,-I do not she see any obvious perforation. Case was discussed with ENT-Dr Ricardo Watkins on 8/31-who recommended that we continue with broad-spectrum antibiotics-he suggested that we use either clindamycin or Unasyn for this particular indication. He reviewed the CT of the head, and did not think the mastoid effusion was anything significant. Will need outpatient follow-up with ENT post discharge.  History of myelodysplastic syndrome and related chronic thrombocytopenia: Followed by hematology (Ricardo Watkins)-continue Promacta. Platelet count stable and close to usual baseline.  History of CAD status post remote PCI: Cautiously continue with Plavix given thrombocytopenia  (seems to be tolerating Plavix well with platelet count around  50 K). Continue metoprolol.  Prior history of CVA: Nonfocal exam, continue Plavix as previous  Type 2 diabetes: CBGs stable, continue with with usual regimen of Lantus on discharge. Recent A1c 6.7 on 12/29/15  Chronic kidney disease stage III: Creatinine close to usual baseline and follow.  Hyponatremia: Resolved, follow electrolytes.  Hypertension: Controlled, continue losartan, metoprolol and Cardura.  Remote history of follicular lymphoma  Left thyroid nodule: seen incidentally on CT Chest.  Consider further evaluation with thyroid ultrasound in the outpatient setting-defer to PCP.Spouse and daughter aware of need to investigate this further.    Procedures/Studies: None  Discharge Diagnoses:  Principal Problem:   Sepsis ([place]) Active Problems:   Thrombocytopenia ([facility])   Pleural effusion, left   Discharge Instructions:  Activity:  As tolerated with Full fall precautions use walker/cane & assistance as needed   Discharge Instructions    Diet - low sodium heart healthy    Complete by:  As directed   Discharge instructions    Complete by:  As directed   Follow with Primary MD  Ricardo Luo TOM, MD  and other consultant's as instructed your Hospitalist MD  A Left thyroid nodule was  seen incidentally on CT scan of  your Chest.  Please ask your PCP to initiate further work up.  Please get a complete blood count and chemistry panel checked by your Primary MD at your next visit, and again as instructed by your Primary MD.  Get Medicines reviewed and adjusted: Please take all your medications with you for your next visit with your Primary MD  Laboratory/radiological data: Please request your Primary MD to go over all hospital tests and procedure/radiological results at the follow up, please ask your Primary MD to get all Hospital records sent to his/her office.  In some cases, they will be blood work,  cultures and biopsy results pending at the time of your discharge. Please request that your primary care M.D. follows up on these results.  Also Note the following: If you experience worsening of your admission symptoms, develop shortness of breath, life threatening emergency, suicidal or homicidal thoughts you must seek medical attention immediately by calling 911 or calling your MD immediately  if symptoms less severe.  You must read complete instructions/literature along with all the possible adverse reactions/side effects for all the Medicines you take and that have been prescribed to you. Take any new Medicines after you have completely understood and accpet all the possible adverse reactions/side effects.   Do not drive when taking Pain medications or sleeping medications (Benzodaizepines)  Do not take more than prescribed Pain, Sleep and Anxiety Medications. It is not advisable to combine anxiety,sleep and pain medications without talking with your primary care practitioner  Special Instructions: If you have smoked or chewed Tobacco  in the last 2 yrs please stop smoking, stop any regular Alcohol  and or any Recreational drug use.  Wear Seat belts while driving.  Please note: You were cared for by a hospitalist during your hospital stay. Once you are discharged, your primary care physician will handle any further medical issues. Please note that NO REFILLS for any discharge medications will be authorized once you are discharged, as it is imperative that you return to your primary care physician (or establish a relationship with a primary care physician if you do not have one) for your post hospital discharge needs so that they can reassess your need for medications and monitor your lab values.   Increase activity slowly  Complete by:  As directed       Medication List    TAKE these medications   acetaminophen 500 MG tablet Commonly known as:  TYLENOL Take 500-1,000 mg by mouth  every 4 (four) hours as needed for mild pain or moderate pain.   amoxicillin-clavulanate 875-125 MG tablet Commonly known as:  AUGMENTIN Take 1 tablet by mouth 2 (two) times daily.   clopidogrel 75 MG tablet Commonly known as:  PLAVIX TAKE 1 TABLET BY MOUTH DAILY WITH BREAKFAST.   doxazosin 4 MG tablet Commonly known as:  CARDURA TAKE 1 TABLET BY MOUTH ONCE A DAY   eltrombopag 50 MG tablet Commonly known as:  PROMACTA Take 1 tablet (50 mg total) by mouth daily. Take on an empty stomach 1 hour before a meal or 2 hours after   ferrous sulfate 325 (65 FE) MG tablet Take 325 mg by mouth daily with breakfast.   glucose blood test strip 1 each by Other route daily. Fasting each morning   Insulin Glargine 100 UNIT/ML Solostar Pen Commonly known as:  LANTUS SOLOSTAR INJECT 15 UNITS INTO THE SKIN EACH MORNING What changed:  See the new instructions.   meclizine 12.5 MG tablet Commonly known as:  ANTIVERT Take 1 tablet (12.5 mg total) by mouth 3 (three) times daily as needed for dizziness.   metoCLOPramide 5 MG tablet Commonly known as:  REGLAN Take 1 tablet (5 mg total) by mouth 3 (three) times daily before meals. What changed:  when to take this  reasons to take this   metoprolol succinate 100 MG 24 hr tablet Commonly known as:  TOPROL-XL TAKE 1 TABLET BY MOUTH ONCE A DAY   MULTIVITAMIN ADULT PO Take 1 tablet by mouth daily.   valsartan 160 MG tablet Commonly known as:  DIOVAN Take 1 tablet (160 mg total) by mouth daily.   VITAMIN B-12 IJ Inject 1 mL as directed every 30 (thirty) days.      Follow-up Information    PICKARD,WARREN TOM, MD. Schedule an appointment as soon as possible for a visit in 1 week(s).   Specialty:  Family Medicine Contact information: [address] [place] [address] [phone]        Ascencion Dike, MD. Schedule an appointment as soon as possible for a visit in 1 week(s).   Specialty:  Otolaryngology Contact  information: [address] [place] [address] [phone]          Allergies  Allergen Reactions  . Glucophage [Metformin Hydrochloride] Other (See Comments)    Chest pain  . Zetia [Ezetimibe] Other (See Comments)    weakness  . Fenofibrate Rash  . Niacin-Lovastatin Er Rash      Consultations:   neurology  ENT-phone consult   Other Procedures/Studies: Dg Chest 1 View  Result Date: 01/30/2016 CLINICAL DATA:  Status post thoracentesis. EXAM: CHEST 1 VIEW COMPARISON:  Chest x-ray 01/27/2016 FINDINGS: The cardiac silhouette, mediastinal and hilar contours are within normal limits and stable. Tortuosity and calcification of the thoracic aorta. Biapical pleural thickening. Significant interval decrease in size of the left-sided pleural effusion. Small residual pleural effusion and overlying atelectasis. No postprocedural pneumothorax. IMPRESSION: Interval reduction in size of left-sided pleural effusion status post thoracentesis. No postprocedural pneumothorax. Electronically Signed   By: Marijo Sanes M.D.   On: 01/30/2016 10:20   Dg Chest 2 View  Result Date: 02/17/2016 CLINICAL DATA:  Fever, cough, and weakness for 1 week. EXAM: CHEST  2 VIEW  COMPARISON:  02/10/2016 FINDINGS: A left chest tube remains in place. Improved left pleural effusion or thickening. Mild atelectasis or infiltration in the left base is also improved. Right lung is clear. Normal heart size and pulmonary vascularity. Calcified and tortuous aorta. No pneumothorax. IMPRESSION: Improved infiltration and effusion in the left base. Left chest tube remains place. Electronically Signed   By: Lucienne Capers M.D.   On: 02/17/2016 18:20   Dg Chest 2 View  Result Date: 02/10/2016 CLINICAL DATA:  Preop insertion of PleurX catheter left lung. Recurrent left pleural effusion. EXAM: CHEST  2 VIEW COMPARISON:  01/30/2016 FINDINGS: Large left pleural effusion, increased since prior study. Left base  atelectasis. No focal opacity on the right. Heart is likely normal in size. No acute bony abnormality. IMPRESSION: Large left pleural effusion, increased since prior study. Left base atelectasis. Electronically Signed   By: Rolm Baptise M.D.   On: 02/10/2016 13:47   Dg Chest 2 View  Result Date: 01/27/2016 CLINICAL DATA:  Chronic shortness of breath and cough, worsening over the past 2 weeks. EXAM: CHEST  2 VIEW COMPARISON:  12/12/2015. FINDINGS: No significant change in a moderate-sized left pleural effusion with associated left lower lobe opacity. This is obscuring portions of the left heart border with no gross change in probable mild cardiac enlargement. The right lung remains clear. Thoracic spine degenerative changes. IMPRESSION: Stable left pleural effusion and associated left basilar atelectasis. Underlying pneumonia cannot be excluded. Electronically Signed   By: Claudie Revering M.D.   On: 01/27/2016 13:59   Ct Head Wo Contrast  Result Date: 02/17/2016 CLINICAL DATA:  Headache. EXAM: CT HEAD WITHOUT CONTRAST TECHNIQUE: Contiguous axial images were obtained from the base of the skull through the vertex without intravenous contrast. COMPARISON:  10/18/2014. FINDINGS: Brain: There is no evidence for acute hemorrhage, hydrocephalus, mass lesion, or abnormal extra-axial fluid collection. No definite CT evidence for acute infarction. Diffuse loss of parenchymal volume is consistent with atrophy. Encephalomalacia right occipital lobe is is again noted consistent with prior infarct. Lacunar infarcts are noted in the right basal ganglia. Vascular: Atherosclerotic calcification is visualized in the carotid arteries. No dense MCA sign. Major dural sinuses are unremarkable. Skull: No evidence for skull fracture Sinuses/Orbits: Visualized paranasal sinuses are clear. Right mastoid air cells are clear. The patient has developed a left mastoid effusion in the interval since the prior study with fluid visible in the  left middle ear. Other: Unremarkable. IMPRESSION: 1. Interval development of left mastoid effusion with fluid visible in the left middle ear. 2. No acute intracranial abnormality. 3. Atrophy with chronic small vessel white matter ischemic disease and old right PCA territory infarct. Electronically Signed   By: Misty Stanley M.D.   On: 02/17/2016 19:13   Ct Chest Wo Contrast  Result Date: 02/18/2016 CLINICAL DATA:  Fever, cough, weakness. EXAM: CT CHEST WITHOUT CONTRAST TECHNIQUE: Multidetector CT imaging of the chest was performed following the standard protocol without IV contrast. COMPARISON:  11/11/2015. FINDINGS: Cardiovascular: Atherosclerotic calcification of the arterial vasculature, including three-vessel involvement of the coronary arteries. Heart is mildly enlarged. No pericardial effusion. Mediastinum/Nodes: Left thyroid nodule measures 2.3 cm, as before. Mediastinal lymph nodes are not enlarged by CT size criteria. Hilar regions are difficult to definitively evaluate without IV contrast. Lymph nodes adjacent to the descending thoracic aorta measure up to 1.3 cm on the left, stable. No axillary adenopathy. Esophagus is grossly unremarkable. Lungs/Pleura: Mild patchy ground-glass in the lungs bilaterally, right greater than left, possibly due  to expiratory phase imaging. Trace left pleural effusion with a small bore catheter in place. Airway is otherwise unremarkable. Upper Abdomen: Visualized portions of the liver, gallbladder are, adrenal glands unremarkable. Tiny stones in the right kidney. Visualized portions of the left kidney, spleen, pancreas, stomach and bowel are grossly unremarkable. Musculoskeletal: No worrisome lytic or sclerotic lesions. Degenerative changes are seen in the spine. IMPRESSION: 1. No findings to explain the patient's symptoms. 2. Trace left pleural effusion with small bore drainage catheter in place. 3. Lymph nodes adjacent to the descending thoracic aorta are stable. 4.  Aortic atherosclerosis and three-vessel coronary artery calcification. 5. **An incidental finding of potential clinical significance has been found. Left thyroid nodule. Consider further evaluation with thyroid ultrasound. If patient is clinically hyperthyroid, consider nuclear medicine thyroid uptake and scan.** 6. Tiny right renal stones. Electronically Signed   By: Lorin Picket M.D.   On: 02/18/2016 13:24   Dg Chest Portable 1 View  Result Date: 02/10/2016 CLINICAL DATA:  Status post left Pleurex catheter insertion. EXAM: PORTABLE CHEST 1 VIEW COMPARISON:  02/10/2016 FINDINGS: Cardiomediastinal silhouette is normal. Mediastinal contours appear intact. There is no evidence of pneumothorax. There has been insertion of left chest tube with interval decrease in the known left pleural effusion. Low lung volumes with crowding of the interstitial markings. Osseous structures are without acute abnormality. Soft tissues are grossly normal. IMPRESSION: Status post left chest tube placement with decrease in left pleural effusion volume. Low lung volume. No evidence of pneumothorax. Electronically Signed   By: Fidela Salisbury M.D.   On: 02/10/2016 16:57   US Thoracentesis Asp Pleural Space W/img Guide  Result Date: 01/30/2016 INDICATION: Recurrent left pleural effusion. Request diagnostic and therapeutic thoracentesis. EXAM: ULTRASOUND GUIDED LEFT THORACENTESIS MEDICATIONS: None. COMPLICATIONS: None immediate. PROCEDURE: An ultrasound guided thoracentesis was thoroughly discussed with the patient and questions answered. The benefits, risks, alternatives and complications were also discussed. The patient understands and wishes to proceed with the procedure. Written consent was obtained. Ultrasound was performed to localize and mark an adequate pocket of fluid in the left chest. The area was then prepped and draped in the normal sterile fashion. 1% Lidocaine was used for local anesthesia. Under ultrasound  guidance a Safe-T-Centesis catheter was introduced. Thoracentesis was performed. The catheter was removed and a dressing applied. FINDINGS: A total of approximately 2.2 L of hazy, amber colored fluid was removed. Samples were sent to the laboratory as requested by the clinical team. IMPRESSION: Successful ultrasound guided left thoracentesis yielding 2.2 L of pleural fluid. Read by: Ascencion Dike PA-C Electronically Signed   By: Aletta Edouard M.D.   On: 01/30/2016 10:14     TODAY-DAY OF DISCHARGE:  Subjective:   Taishawn Brennen today has no chest abdominal pain,no new weakness tingling or numbness, feels much better wants to go home today.   Objective:   Blood pressure (!) 172/80, pulse 84, temperature 97.8 F (36.6 C), temperature source Oral, resp. rate 18, height 5\' 8"  (1.727 m), weight 81.5 kg (179 lb 9.6 oz), SpO2 94 %.  Intake/Output Summary (Last 24 hours) at 02/21/16 1358 Last data filed at 02/21/16 0900  Gross per 24 hour  Intake              480 ml  Output             1600 ml  Net            -1120 ml   Filed Weights   02/19/16 0547 02/20/16  0552 02/21/16 0438  Weight: 82.2 kg (181 lb 3.2 oz) 82.6 kg (182 lb 1.6 oz) 81.5 kg (179 lb 9.6 oz)    Exam: Awake Alert, Oriented *3, No new F.N deficits, Normal affect [place].AT,PERRAL Supple Neck,No JVD, No cervical lymphadenopathy appriciated.  Symmetrical Chest wall movement, Good air movement bilaterally, CTAB RRR,No Gallops,Rubs or new Murmurs, No Parasternal Heave +ve B.Sounds, Abd Soft, Non tender, No organomegaly appriciated, No rebound -guarding or rigidity. No Cyanosis, Clubbing or edema, No new Rash or bruise   PERTINENT RADIOLOGIC STUDIES: Dg Chest 1 View  Result Date: 01/30/2016 CLINICAL DATA:  Status post thoracentesis. EXAM: CHEST 1 VIEW COMPARISON:  Chest x-ray 01/27/2016 FINDINGS: The cardiac silhouette, mediastinal and hilar contours are within normal limits and stable. Tortuosity and calcification of the thoracic  aorta. Biapical pleural thickening. Significant interval decrease in size of the left-sided pleural effusion. Small residual pleural effusion and overlying atelectasis. No postprocedural pneumothorax. IMPRESSION: Interval reduction in size of left-sided pleural effusion status post thoracentesis. No postprocedural pneumothorax. Electronically Signed   By: Marijo Sanes M.D.   On: 01/30/2016 10:20   Dg Chest 2 View  Result Date: 02/17/2016 CLINICAL DATA:  Fever, cough, and weakness for 1 week. EXAM: CHEST  2 VIEW COMPARISON:  02/10/2016 FINDINGS: A left chest tube remains in place. Improved left pleural effusion or thickening. Mild atelectasis or infiltration in the left base is also improved. Right lung is clear. Normal heart size and pulmonary vascularity. Calcified and tortuous aorta. No pneumothorax. IMPRESSION: Improved infiltration and effusion in the left base. Left chest tube remains place. Electronically Signed   By: Lucienne Capers M.D.   On: 02/17/2016 18:20   Dg Chest 2 View  Result Date: 02/10/2016 CLINICAL DATA:  Preop insertion of PleurX catheter left lung. Recurrent left pleural effusion. EXAM: CHEST  2 VIEW COMPARISON:  01/30/2016 FINDINGS: Large left pleural effusion, increased since prior study. Left base atelectasis. No focal opacity on the right. Heart is likely normal in size. No acute bony abnormality. IMPRESSION: Large left pleural effusion, increased since prior study. Left base atelectasis. Electronically Signed   By: Rolm Baptise M.D.   On: 02/10/2016 13:47   Dg Chest 2 View  Result Date: 01/27/2016 CLINICAL DATA:  Chronic shortness of breath and cough, worsening over the past 2 weeks. EXAM: CHEST  2 VIEW COMPARISON:  12/12/2015. FINDINGS: No significant change in a moderate-sized left pleural effusion with associated left lower lobe opacity. This is obscuring portions of the left heart border with no gross change in probable mild cardiac enlargement. The right lung remains  clear. Thoracic spine degenerative changes. IMPRESSION: Stable left pleural effusion and associated left basilar atelectasis. Underlying pneumonia cannot be excluded. Electronically Signed   By: Claudie Revering M.D.   On: 01/27/2016 13:59   Ct Head Wo Contrast  Result Date: 02/17/2016 CLINICAL DATA:  Headache. EXAM: CT HEAD WITHOUT CONTRAST TECHNIQUE: Contiguous axial images were obtained from the base of the skull through the vertex without intravenous contrast. COMPARISON:  10/18/2014. FINDINGS: Brain: There is no evidence for acute hemorrhage, hydrocephalus, mass lesion, or abnormal extra-axial fluid collection. No definite CT evidence for acute infarction. Diffuse loss of parenchymal volume is consistent with atrophy. Encephalomalacia right occipital lobe is is again noted consistent with prior infarct. Lacunar infarcts are noted in the right basal ganglia. Vascular: Atherosclerotic calcification is visualized in the carotid arteries. No dense MCA sign. Major dural sinuses are unremarkable. Skull: No evidence for skull fracture Sinuses/Orbits: Visualized paranasal sinuses  are clear. Right mastoid air cells are clear. The patient has developed a left mastoid effusion in the interval since the prior study with fluid visible in the left middle ear. Other: Unremarkable. IMPRESSION: 1. Interval development of left mastoid effusion with fluid visible in the left middle ear. 2. No acute intracranial abnormality. 3. Atrophy with chronic small vessel white matter ischemic disease and old right PCA territory infarct. Electronically Signed   By: Misty Stanley M.D.   On: 02/17/2016 19:13   Ct Chest Wo Contrast  Result Date: 02/18/2016 CLINICAL DATA:  Fever, cough, weakness. EXAM: CT CHEST WITHOUT CONTRAST TECHNIQUE: Multidetector CT imaging of the chest was performed following the standard protocol without IV contrast. COMPARISON:  11/11/2015. FINDINGS: Cardiovascular: Atherosclerotic calcification of the arterial  vasculature, including three-vessel involvement of the coronary arteries. Heart is mildly enlarged. No pericardial effusion. Mediastinum/Nodes: Left thyroid nodule measures 2.3 cm, as before. Mediastinal lymph nodes are not enlarged by CT size criteria. Hilar regions are difficult to definitively evaluate without IV contrast. Lymph nodes adjacent to the descending thoracic aorta measure up to 1.3 cm on the left, stable. No axillary adenopathy. Esophagus is grossly unremarkable. Lungs/Pleura: Mild patchy ground-glass in the lungs bilaterally, right greater than left, possibly due to expiratory phase imaging. Trace left pleural effusion with a small bore catheter in place. Airway is otherwise unremarkable. Upper Abdomen: Visualized portions of the liver, gallbladder are, adrenal glands unremarkable. Tiny stones in the right kidney. Visualized portions of the left kidney, spleen, pancreas, stomach and bowel are grossly unremarkable. Musculoskeletal: No worrisome lytic or sclerotic lesions. Degenerative changes are seen in the spine. IMPRESSION: 1. No findings to explain the patient's symptoms. 2. Trace left pleural effusion with small bore drainage catheter in place. 3. Lymph nodes adjacent to the descending thoracic aorta are stable. 4. Aortic atherosclerosis and three-vessel coronary artery calcification. 5. **An incidental finding of potential clinical significance has been found. Left thyroid nodule. Consider further evaluation with thyroid ultrasound. If patient is clinically hyperthyroid, consider nuclear medicine thyroid uptake and scan.** 6. Tiny right renal stones. Electronically Signed   By: Lorin Picket M.D.   On: 02/18/2016 13:24   Dg Chest Portable 1 View  Result Date: 02/10/2016 CLINICAL DATA:  Status post left Pleurex catheter insertion. EXAM: PORTABLE CHEST 1 VIEW COMPARISON:  02/10/2016 FINDINGS: Cardiomediastinal silhouette is normal. Mediastinal contours appear intact. There is no evidence of  pneumothorax. There has been insertion of left chest tube with interval decrease in the known left pleural effusion. Low lung volumes with crowding of the interstitial markings. Osseous structures are without acute abnormality. Soft tissues are grossly normal. IMPRESSION: Status post left chest tube placement with decrease in left pleural effusion volume. Low lung volume. No evidence of pneumothorax. Electronically Signed   By: Fidela Salisbury M.D.   On: 02/10/2016 16:57   US Thoracentesis Asp Pleural Space W/img Guide  Result Date: 01/30/2016 INDICATION: Recurrent left pleural effusion. Request diagnostic and therapeutic thoracentesis. EXAM: ULTRASOUND GUIDED LEFT THORACENTESIS MEDICATIONS: None. COMPLICATIONS: None immediate. PROCEDURE: An ultrasound guided thoracentesis was thoroughly discussed with the patient and questions answered. The benefits, risks, alternatives and complications were also discussed. The patient understands and wishes to proceed with the procedure. Written consent was obtained. Ultrasound was performed to localize and mark an adequate pocket of fluid in the left chest. The area was then prepped and draped in the normal sterile fashion. 1% Lidocaine was used for local anesthesia. Under ultrasound guidance a Safe-T-Centesis catheter was introduced. Thoracentesis  was performed. The catheter was removed and a dressing applied. FINDINGS: A total of approximately 2.2 L of hazy, amber colored fluid was removed. Samples were sent to the laboratory as requested by the clinical team. IMPRESSION: Successful ultrasound guided left thoracentesis yielding 2.2 L of pleural fluid. Read by: Ascencion Dike PA-C Electronically Signed   By: Aletta Edouard M.D.   On: 01/30/2016 10:14     PERTINENT LAB RESULTS: CBC:  Recent Labs  02/19/16 0454 02/20/16 0412  WBC 5.8 6.4  HGB 9.8* 10.6*  HCT 33.5* 35.0*  PLT 50* 56*   CMET CMP     Component Value Date/Time   NA 136 02/19/2016 0454    NA 139 11/11/2015 1435   K 4.1 02/19/2016 0454   K 4.0 11/11/2015 1435   CL 105 02/19/2016 0454   CL 107 09/27/2012 0940   CO2 28 02/19/2016 0454   CO2 26 11/11/2015 1435   GLUCOSE 127 (H) 02/19/2016 0454   GLUCOSE 142 (H) 11/11/2015 1435   GLUCOSE 89 09/27/2012 0940   BUN 18 02/19/2016 0454   BUN 15.7 11/11/2015 1435   CREATININE 1.55 (H) 02/19/2016 0454   CREATININE 1.4 (H) 11/11/2015 1435   CALCIUM 8.2 (L) 02/19/2016 0454   CALCIUM 8.6 11/11/2015 1435   PROT 6.7 02/18/2016 0456   PROT 8.6 (H) 11/11/2015 1435   ALBUMIN 2.4 (L) 02/18/2016 0456   ALBUMIN 3.2 (L) 11/11/2015 1435   AST 19 02/18/2016 0456   AST 16 11/11/2015 1435   ALT 17 02/18/2016 0456   ALT 19 11/11/2015 1435   ALKPHOS 52 02/18/2016 0456   ALKPHOS 86 11/11/2015 1435   BILITOT 0.6 02/18/2016 0456   BILITOT 0.33 11/11/2015 1435   GFRNONAA 40 (L) 02/19/2016 0454   GFRNONAA 49 (L) 07/02/2015 0841   GFRAA 46 (L) 02/19/2016 0454   GFRAA 57 (L) 07/02/2015 0841    GFR Estimated Creatinine Clearance: 34.9 mL/min (by C-G formula based on SCr of 1.55 mg/dL). No results for input(s): LIPASE, AMYLASE in the last 72 hours. No results for input(s): CKTOTAL, CKMB, CKMBINDEX, TROPONINI in the last 72 hours. Invalid input(s): POCBNP No results for input(s): DDIMER in the last 72 hours. No results for input(s): HGBA1C in the last 72 hours. No results for input(s): CHOL, HDL, LDLCALC, TRIG, CHOLHDL, LDLDIRECT in the last 72 hours. No results for input(s): TSH, T4TOTAL, T3FREE, THYROIDAB in the last 72 hours.  Invalid input(s): FREET3 No results for input(s): VITAMINB12, FOLATE, FERRITIN, TIBC, IRON, RETICCTPCT in the last 72 hours. Coags: No results for input(s): INR in the last 72 hours.  Invalid input(s): PT Microbiology: Recent Results (from the past 240 hour(s))  Culture, blood (Routine x 2)     Status: None (Preliminary result)   Collection Time: 02/17/16  5:28 PM  Result Value Ref Range Status   Specimen  Description BLOOD RIGHT ANTECUBITAL  Final   Special Requests BOTTLES DRAWN AEROBIC AND ANAEROBIC 10CC  Final   Culture NO GROWTH 3 DAYS  Final   Report Status PENDING  Incomplete  Urine culture     Status: Abnormal   Collection Time: 02/17/16  6:17 PM  Result Value Ref Range Status   Specimen Description URINE, RANDOM  Final   Special Requests NONE  Final   Culture >=100,000 COLONIES/mL ENTEROCOCCUS FAECALIS (A)  Final   Report Status 02/21/2016 FINAL  Final   Organism ID, Bacteria ENTEROCOCCUS FAECALIS (A)  Final      Susceptibility   Enterococcus faecalis - MIC*  AMPICILLIN <=2 SENSITIVE Sensitive     LEVOFLOXACIN 1 SENSITIVE Sensitive     NITROFURANTOIN <=16 SENSITIVE Sensitive     VANCOMYCIN 1 SENSITIVE Sensitive     * >=100,000 COLONIES/mL ENTEROCOCCUS FAECALIS  Culture, blood (Routine X 2) w Reflex to ID Panel     Status: None (Preliminary result)   Collection Time: 02/17/16  6:25 PM  Result Value Ref Range Status   Specimen Description BLOOD LEFT HAND  Final   Special Requests BOTTLES DRAWN AEROBIC AND ANAEROBIC 5CC EACH  Final   Culture NO GROWTH 3 DAYS  Final   Report Status PENDING  Incomplete  Culture, body fluid-bottle     Status: None (Preliminary result)   Collection Time: 02/18/16 11:49 AM  Result Value Ref Range Status   Specimen Description FLUID LEFT PLEURAL  Final   Special Requests NONE  Final   Culture NO GROWTH 2 DAYS  Final   Report Status PENDING  Incomplete  Gram stain     Status: None   Collection Time: 02/18/16 11:49 AM  Result Value Ref Range Status   Specimen Description FLUID LEFT PLEURAL  Final   Special Requests NONE  Final   Gram Stain   Final    ABUNDANT WBC PRESENT,BOTH PMN AND MONONUCLEAR NO ORGANISMS SEEN    Report Status 02/18/2016 FINAL  Final    FURTHER DISCHARGE INSTRUCTIONS:  Get Medicines reviewed and adjusted: Please take all your medications with you for your next visit with your Primary MD  Laboratory/radiological  data: Please request your Primary MD to go over all hospital tests and procedure/radiological results at the follow up, please ask your Primary MD to get all Hospital records sent to his/her office.  In some cases, they will be blood work, cultures and biopsy results pending at the time of your discharge. Please request that your primary care M.D. goes through all the records of your hospital data and follows up on these results.  Also Note the following: If you experience worsening of your admission symptoms, develop shortness of breath, life threatening emergency, suicidal or homicidal thoughts you must seek medical attention immediately by calling 911 or calling your MD immediately  if symptoms less severe.  You must read complete instructions/literature along with all the possible adverse reactions/side effects for all the Medicines you take and that have been prescribed to you. Take any new Medicines after you have completely understood and accpet all the possible adverse reactions/side effects.   Do not drive when taking Pain medications or sleeping medications (Benzodaizepines)  Do not take more than prescribed Pain, Sleep and Anxiety Medications. It is not advisable to combine anxiety,sleep and pain medications without talking with your primary care practitioner  Special Instructions: If you have smoked or chewed Tobacco  in the last 2 yrs please stop smoking, stop any regular Alcohol  and or any Recreational drug use.  Wear Seat belts while driving.  Please note: You were cared for by a hospitalist during your hospital stay. Once you are discharged, your primary care physician will handle any further medical issues. Please note that NO REFILLS for any discharge medications will be authorized once you are discharged, as it is imperative that you return to your primary care physician (or establish a relationship with a primary care physician if you do not have one) for your post hospital  discharge needs so that they can reassess your need for medications and monitor your lab values.  Total Time spent coordinating discharge including  counseling, education and face to face time equals 45 minutes.  SignedOren Binet 02/21/2016 1:58 PM

## 2016-02-21 NOTE — Progress Notes (Signed)
PROGRESS NOTE        PATIENT DETAILS Name: Ricardo Watkins Age: 82 y.o. Sex: male Date of Birth: [birth year] Admit Date: 02/17/2016 Admitting Physician Norval Morton, MD LK:[id number] TOM, MD  Brief Narrative: Patient is a 82 y.o. male myelodysplastic syndrome, thrombocytopenia secondary to MDS, recurrent exudative pleural effusion status post Pleurx catheter placement on 8/22, admitted with several days history of fever, shortness of breath, cough and headache.Admitted and started on empiric Abx-sepsis pathophysiology has resolved. See below for further details.  Subjective: Sleeping comfortably-overall better-continues to have some left sided headaches-left ear pain is only minimal.  Assessment/Plan: Active Problems: Sepsis secondary to a unknown source: Sepsis pathophysiology has resolved. Foci of infection is not apparent-UA/CT chest negative for infection. Does have probable otitis media-but not sure if this can account for this degree of infection. Blood cultures and pleural fluid cultures are negative so far. Although did have headache on admission, no meningeal signs and felt not to have meningitis. Furthermore, patient has severe thrombocytopenia and is on Plavix, and hence lumbar puncture was not pursued-he was covered with empiric broad spectrum Abx-since he improved, he has been narrowed down to just  Unasyn-suspect he could be narrowed down further to just Augmentin.   History of chronic intermittent headaches: Claims that his headaches are usually not as severe as on initial presentation to the emergency room. Not sure if worsening headaches is secondary to otitis media/fluid in the mass left mastoid seen on CT scan. Although headaches have improved-still present-increase Neurontin to TID-will ask Neurology to evaluate.   Recurrent left-sided pleural effusion: Exudative by light's criteria-underwent a recent left Pleurx catheter placement on 8/22.  Catheter site that any obvious infection-pleural fluid cell count shows increased WBC with predominant lymphocytes. CT chest without any pneumonia. Pleural fluid cultures negative so far.  Left ear pain: Likely secondary to otitis media-on otoscopic exam-some wax present in the canal-but see no drainage in the external canal-visualized portion of the tympanic membrane appears red and inflamed,-I do not she see any obvious perforation. Case was discussed with ENT-Dr Melene Plan on 8/31-who recommended that we continue with broad-spectrum antibiotics-he suggested that we use either clindamycin or Unasyn for this particular indication. He reviewed the CT of the head, and did not think the mastoid effusion was anything significant. Will need outpatient follow-up with ENT post discharge.  History of myelodysplastic syndrome and related chronic thrombocytopenia: Followed by hematology ([facility])-continue Promacta. Platelet count stable and close to usual baseline.  History of CAD status post remote PCI: Cautiously continue with Plavix given thrombocytopenia (seems to be tolerating Plavix well with platelet count around 50 K). Continue metoprolol.  Prior history of CVA: Nonfocal exam, continue Plavix-if platelet counts decrease- further may need to stop  Type 2 diabetes: CBGs stable, continue with SSI and 5 units of Lantus. Recent A1c 6.7 on 12/29/15  Chronic kidney disease stage III: Creatinine close to usual baseline and follow.  Hyponatremia: Resolved, follow electrolytes.  Hypertension: Controlled, continue losartan, metoprolol and Cardura.  Remote history of follicular lymphoma  DVT Prophylaxis: SCD's  Code Status: Full code  Family Communication: None at bedside  Disposition Plan: Remain inpatient-home in the next few days  Antimicrobial agents: IV vancomycin 8/29>>8/31 IV cefepime 8/29>>8/31 IV ampicillin 8/28>>8/31 IV Unasyn 8/31>>  Procedures: None  CONSULTS:  None  Time  spent: 25 minutes-Greater than 50% of  this time was spent in counseling, explanation of diagnosis, planning of further management, and coordination of care.  MEDICATIONS: Anti-infectives    Start     Dose/Rate Route Frequency Ordered Stop   02/19/16 1830  Ampicillin-Sulbactam (UNASYN) 3 g in sodium chloride 0.9 % 100 mL IVPB     3 g 100 mL/hr over 60 Minutes Intravenous Every 8 hours 02/19/16 1311     02/18/16 1700  vancomycin (VANCOCIN) 1,250 mg in sodium chloride 0.9 % 250 mL IVPB  Status:  Discontinued     1,250 mg 166.7 mL/hr over 90 Minutes Intravenous Every 24 hours 02/17/16 1853 02/18/16 1008   02/18/16 1400  vancomycin (VANCOCIN) 1,250 mg in sodium chloride 0.9 % 250 mL IVPB  Status:  Discontinued     1,250 mg 166.7 mL/hr over 90 Minutes Intravenous Every 24 hours 02/18/16 1008 02/19/16 1257   02/18/16 0300  piperacillin-tazobactam (ZOSYN) IVPB 3.375 g  Status:  Discontinued     3.375 g 12.5 mL/hr over 240 Minutes Intravenous Every 8 hours 02/17/16 1853 02/17/16 2211   02/17/16 2215  ampicillin (OMNIPEN) 2 g in sodium chloride 0.9 % 50 mL IVPB  Status:  Discontinued     2 g 150 mL/hr over 20 Minutes Intravenous Every 6 hours 02/17/16 2211 02/19/16 1257   02/17/16 2215  ceFEPIme (MAXIPIME) 2 g in dextrose 5 % 50 mL IVPB  Status:  Discontinued     2 g 100 mL/hr over 30 Minutes Intravenous Every 12 hours 02/17/16 2211 02/19/16 1257   02/17/16 1845  piperacillin-tazobactam (ZOSYN) IVPB 3.375 g     3.375 g 100 mL/hr over 30 Minutes Intravenous  Once 02/17/16 1834 02/17/16 1939   02/17/16 1845  vancomycin (VANCOCIN) IVPB 1000 mg/200 mL premix     1,000 mg 200 mL/hr over 60 Minutes Intravenous  Once 02/17/16 1834 02/17/16 2008      Scheduled Meds: . ampicillin-sulbactam (UNASYN) IV  3 g Intravenous Q8H  . calcium gluconate  1 g Intravenous Once  . clopidogrel  75 mg Oral Daily  . doxazosin  4 mg Oral Daily  . eltrombopag  50 mg Oral Daily  . feeding supplement (ENSURE ENLIVE)   237 mL Oral Q24H  . ferrous sulfate  325 mg Oral Q breakfast  . gabapentin  300 mg Oral TID  . insulin aspart  0-9 Units Subcutaneous TID WC  . insulin glargine  5 Units Subcutaneous QHS  . irbesartan  150 mg Oral Daily  . metoCLOPramide  5 mg Oral TID AC  . metoprolol succinate  100 mg Oral Daily  . multivitamin with minerals  1 tablet Oral Daily  . potassium chloride SA  20 mEq Oral BID   Continuous Infusions:  PRN Meds:.acetaminophen **OR** acetaminophen, albuterol, HYDROcodone-acetaminophen, ipratropium, meclizine, ondansetron **OR** ondansetron (ZOFRAN) IV   PHYSICAL EXAM: Vital signs: Vitals:   02/20/16 0954 02/20/16 1236 02/20/16 1900 02/21/16 0438  BP: 130/62 136/69 131/60 (!) 138/54  Pulse: (!) 103 92 87 67  Resp: 16 18 18 19   Temp: 97.7 F (36.5 C) 98.4 F (36.9 C) 97.9 F (36.6 C) 97.8 F (36.6 C)  TempSrc:  Oral Oral Oral  SpO2: 97% 98% 98% 96%  Weight:    81.5 kg (179 lb 9.6 oz)  Height:       Filed Weights   02/19/16 0547 02/20/16 0552 02/21/16 0438  Weight: 82.2 kg (181 lb 3.2 oz) 82.6 kg (182 lb 1.6 oz) 81.5 kg (179 lb 9.6 oz)   Body  mass index is 27.31 kg/m.   Gen Exam: Awake and alert with clear speech. Not in any distress  Neck: Supple, No JVD.  No meningeal signs. Otoscopic examOn 8/31: Left side-some wax present in the external canal, but no discharge seen, visualized portion of the tympanic membrane inflamed. No perforation seen. Right side appeared without any significant abnormalities except for some wax the external auditory canal. Chest: B/L Clear.   CVS: S1 S2 Regular, no murmurs.  Abdomen: soft, BS +, non tender, non distended.  Extremities: no edema, lower extremities warm to touch. Neurologic: Non Focal.   Skin: No Rash or lesions   Wounds: N/A.    I have personally reviewed following labs and imaging studies  LABORATORY DATA: CBC:  Recent Labs Lab 02/17/16 1722 02/18/16 0456 02/19/16 0454 02/20/16 0412  WBC 9.2 8.6 5.8 6.4   NEUTROABS 5.5  --   --   --   HGB 11.5* 9.7* 9.8* 10.6*  HCT 38.5* 32.1* 33.5* 35.0*  MCV 79.9 79.1 81.1 80.6  PLT 45* 44* 50* 56*    Basic Metabolic Panel:  Recent Labs Lab 02/17/16 1722 02/17/16 2259 02/18/16 0456 02/19/16 0454  NA 132*  --  134* 136  K 3.9  --  3.9 4.1  CL 99*  --  103 105  CO2 25  --  25 28  GLUCOSE 143*  --  122* 127*  BUN 20  --  20 18  CREATININE 1.70*  --  1.68* 1.55*  CALCIUM 8.3*  --  7.9* 8.2*  MG  --  1.7  --   --     GFR: Estimated Creatinine Clearance: 34.9 mL/min (by C-G formula based on SCr of 1.55 mg/dL).  Liver Function Tests:  Recent Labs Lab 02/17/16 1722 02/18/16 0456  AST 22 19  ALT 20 17  ALKPHOS 63 52  BILITOT 0.2* 0.6  PROT 8.2* 6.7  ALBUMIN 2.8* 2.4*   No results for input(s): LIPASE, AMYLASE in the last 168 hours. No results for input(s): AMMONIA in the last 168 hours.  Coagulation Profile: No results for input(s): INR, PROTIME in the last 168 hours.  Cardiac Enzymes:  Recent Labs Lab 02/17/16 2259 02/18/16 0456 02/18/16 1019  TROPONINI 0.03* 0.04* 0.04*    BNP (last 3 results)  Recent Labs  01/27/16 1119  PROBNP 311.0*    HbA1C: No results for input(s): HGBA1C in the last 72 hours.  CBG:  Recent Labs Lab 02/20/16 0529 02/20/16 1116 02/20/16 1659 02/20/16 2034 02/21/16 0557  GLUCAP 130* 154* 142* 255* 125*    Lipid Profile: No results for input(s): CHOL, HDL, LDLCALC, TRIG, CHOLHDL, LDLDIRECT in the last 72 hours.  Thyroid Function Tests: No results for input(s): TSH, T4TOTAL, FREET4, T3FREE, THYROIDAB in the last 72 hours.  Anemia Panel: No results for input(s): VITAMINB12, FOLATE, FERRITIN, TIBC, IRON, RETICCTPCT in the last 72 hours.  Urine analysis:    Component Value Date/Time   COLORURINE YELLOW 02/17/2016 1819   APPEARANCEUR HAZY (A) 02/17/2016 1819   LABSPEC 1.030 02/17/2016 1819   PHURINE 5.0 02/17/2016 1819   GLUCOSEU NEGATIVE 02/17/2016 1819   HGBUR TRACE (A)  02/17/2016 1819   BILIRUBINUR NEGATIVE 02/17/2016 1819   KETONESUR NEGATIVE 02/17/2016 1819   PROTEINUR 100 (A) 02/17/2016 1819   NITRITE NEGATIVE 02/17/2016 1819   LEUKOCYTESUR NEGATIVE 02/17/2016 1819    Sepsis Labs: Lactic Acid, Venous    Component Value Date/Time   LATICACIDVEN 1.65 02/17/2016 2001    MICROBIOLOGY: Recent Results (from  the past 240 hour(s))  Culture, blood (Routine x 2)     Status: None (Preliminary result)   Collection Time: 02/17/16  5:28 PM  Result Value Ref Range Status   Specimen Description BLOOD RIGHT ANTECUBITAL  Final   Special Requests BOTTLES DRAWN AEROBIC AND ANAEROBIC 10CC  Final   Culture NO GROWTH 3 DAYS  Final   Report Status PENDING  Incomplete  Urine culture     Status: Abnormal   Collection Time: 02/17/16  6:17 PM  Result Value Ref Range Status   Specimen Description URINE, RANDOM  Final   Special Requests NONE  Final   Culture >=100,000 COLONIES/mL ENTEROCOCCUS FAECALIS (A)  Final   Report Status 02/21/2016 FINAL  Final   Organism ID, Bacteria ENTEROCOCCUS FAECALIS (A)  Final      Susceptibility   Enterococcus faecalis - MIC*    AMPICILLIN <=2 SENSITIVE Sensitive     LEVOFLOXACIN 1 SENSITIVE Sensitive     NITROFURANTOIN <=16 SENSITIVE Sensitive     VANCOMYCIN 1 SENSITIVE Sensitive     * >=100,000 COLONIES/mL ENTEROCOCCUS FAECALIS  Culture, blood (Routine X 2) w Reflex to ID Panel     Status: None (Preliminary result)   Collection Time: 02/17/16  6:25 PM  Result Value Ref Range Status   Specimen Description BLOOD LEFT HAND  Final   Special Requests BOTTLES DRAWN AEROBIC AND ANAEROBIC 5CC EACH  Final   Culture NO GROWTH 3 DAYS  Final   Report Status PENDING  Incomplete  Culture, body fluid-bottle     Status: None (Preliminary result)   Collection Time: 02/18/16 11:49 AM  Result Value Ref Range Status   Specimen Description FLUID LEFT PLEURAL  Final   Special Requests NONE  Final   Culture NO GROWTH 2 DAYS  Final   Report  Status PENDING  Incomplete  Gram stain     Status: None   Collection Time: 02/18/16 11:49 AM  Result Value Ref Range Status   Specimen Description FLUID LEFT PLEURAL  Final   Special Requests NONE  Final   Gram Stain   Final    ABUNDANT WBC PRESENT,BOTH PMN AND MONONUCLEAR NO ORGANISMS SEEN    Report Status 02/18/2016 FINAL  Final    RADIOLOGY STUDIES/RESULTS: Dg Chest 1 View  Result Date: 01/30/2016 CLINICAL DATA:  Status post thoracentesis. EXAM: CHEST 1 VIEW COMPARISON:  Chest x-ray 01/27/2016 FINDINGS: The cardiac silhouette, mediastinal and hilar contours are within normal limits and stable. Tortuosity and calcification of the thoracic aorta. Biapical pleural thickening. Significant interval decrease in size of the left-sided pleural effusion. Small residual pleural effusion and overlying atelectasis. No postprocedural pneumothorax. IMPRESSION: Interval reduction in size of left-sided pleural effusion status post thoracentesis. No postprocedural pneumothorax. Electronically Signed   By: Marijo Sanes M.D.   On: 01/30/2016 10:20   Dg Chest 2 View  Result Date: 02/17/2016 CLINICAL DATA:  Fever, cough, and weakness for 1 week. EXAM: CHEST  2 VIEW COMPARISON:  02/10/2016 FINDINGS: A left chest tube remains in place. Improved left pleural effusion or thickening. Mild atelectasis or infiltration in the left base is also improved. Right lung is clear. Normal heart size and pulmonary vascularity. Calcified and tortuous aorta. No pneumothorax. IMPRESSION: Improved infiltration and effusion in the left base. Left chest tube remains place. Electronically Signed   By: Lucienne Capers M.D.   On: 02/17/2016 18:20   Dg Chest 2 View  Result Date: 02/10/2016 CLINICAL DATA:  Preop insertion of PleurX catheter left lung.  Recurrent left pleural effusion. EXAM: CHEST  2 VIEW COMPARISON:  01/30/2016 FINDINGS: Large left pleural effusion, increased since prior study. Left base atelectasis. No focal opacity on  the right. Heart is likely normal in size. No acute bony abnormality. IMPRESSION: Large left pleural effusion, increased since prior study. Left base atelectasis. Electronically Signed   By: Rolm Baptise M.D.   On: 02/10/2016 13:47   Dg Chest 2 View  Result Date: 01/27/2016 CLINICAL DATA:  Chronic shortness of breath and cough, worsening over the past 2 weeks. EXAM: CHEST  2 VIEW COMPARISON:  12/12/2015. FINDINGS: No significant change in a moderate-sized left pleural effusion with associated left lower lobe opacity. This is obscuring portions of the left heart border with no gross change in probable mild cardiac enlargement. The right lung remains clear. Thoracic spine degenerative changes. IMPRESSION: Stable left pleural effusion and associated left basilar atelectasis. Underlying pneumonia cannot be excluded. Electronically Signed   By: Claudie Revering M.D.   On: 01/27/2016 13:59   Ct Head Wo Contrast  Result Date: 02/17/2016 CLINICAL DATA:  Headache. EXAM: CT HEAD WITHOUT CONTRAST TECHNIQUE: Contiguous axial images were obtained from the base of the skull through the vertex without intravenous contrast. COMPARISON:  10/18/2014. FINDINGS: Brain: There is no evidence for acute hemorrhage, hydrocephalus, mass lesion, or abnormal extra-axial fluid collection. No definite CT evidence for acute infarction. Diffuse loss of parenchymal volume is consistent with atrophy. Encephalomalacia right occipital lobe is is again noted consistent with prior infarct. Lacunar infarcts are noted in the right basal ganglia. Vascular: Atherosclerotic calcification is visualized in the carotid arteries. No dense MCA sign. Major dural sinuses are unremarkable. Skull: No evidence for skull fracture Sinuses/Orbits: Visualized paranasal sinuses are clear. Right mastoid air cells are clear. The patient has developed a left mastoid effusion in the interval since the prior study with fluid visible in the left middle ear. Other:  Unremarkable. IMPRESSION: 1. Interval development of left mastoid effusion with fluid visible in the left middle ear. 2. No acute intracranial abnormality. 3. Atrophy with chronic small vessel white matter ischemic disease and old right PCA territory infarct. Electronically Signed   By: Misty Stanley M.D.   On: 02/17/2016 19:13   Ct Chest Wo Contrast  Result Date: 02/18/2016 CLINICAL DATA:  Fever, cough, weakness. EXAM: CT CHEST WITHOUT CONTRAST TECHNIQUE: Multidetector CT imaging of the chest was performed following the standard protocol without IV contrast. COMPARISON:  11/11/2015. FINDINGS: Cardiovascular: Atherosclerotic calcification of the arterial vasculature, including three-vessel involvement of the coronary arteries. Heart is mildly enlarged. No pericardial effusion. Mediastinum/Nodes: Left thyroid nodule measures 2.3 cm, as before. Mediastinal lymph nodes are not enlarged by CT size criteria. Hilar regions are difficult to definitively evaluate without IV contrast. Lymph nodes adjacent to the descending thoracic aorta measure up to 1.3 cm on the left, stable. No axillary adenopathy. Esophagus is grossly unremarkable. Lungs/Pleura: Mild patchy ground-glass in the lungs bilaterally, right greater than left, possibly due to expiratory phase imaging. Trace left pleural effusion with a small bore catheter in place. Airway is otherwise unremarkable. Upper Abdomen: Visualized portions of the liver, gallbladder are, adrenal glands unremarkable. Tiny stones in the right kidney. Visualized portions of the left kidney, spleen, pancreas, stomach and bowel are grossly unremarkable. Musculoskeletal: No worrisome lytic or sclerotic lesions. Degenerative changes are seen in the spine. IMPRESSION: 1. No findings to explain the patient's symptoms. 2. Trace left pleural effusion with small bore drainage catheter in place. 3. Lymph nodes adjacent to the  descending thoracic aorta are stable. 4. Aortic atherosclerosis and  three-vessel coronary artery calcification. 5. **An incidental finding of potential clinical significance has been found. Left thyroid nodule. Consider further evaluation with thyroid ultrasound. If patient is clinically hyperthyroid, consider nuclear medicine thyroid uptake and scan.** 6. Tiny right renal stones. Electronically Signed   By: Lorin Picket M.D.   On: 02/18/2016 13:24   Dg Chest Portable 1 View  Result Date: 02/10/2016 CLINICAL DATA:  Status post left Pleurex catheter insertion. EXAM: PORTABLE CHEST 1 VIEW COMPARISON:  02/10/2016 FINDINGS: Cardiomediastinal silhouette is normal. Mediastinal contours appear intact. There is no evidence of pneumothorax. There has been insertion of left chest tube with interval decrease in the known left pleural effusion. Low lung volumes with crowding of the interstitial markings. Osseous structures are without acute abnormality. Soft tissues are grossly normal. IMPRESSION: Status post left chest tube placement with decrease in left pleural effusion volume. Low lung volume. No evidence of pneumothorax. Electronically Signed   By: Fidela Salisbury M.D.   On: 02/10/2016 16:57   US Thoracentesis Asp Pleural Space W/img Guide  Result Date: 01/30/2016 INDICATION: Recurrent left pleural effusion. Request diagnostic and therapeutic thoracentesis. EXAM: ULTRASOUND GUIDED LEFT THORACENTESIS MEDICATIONS: None. COMPLICATIONS: None immediate. PROCEDURE: An ultrasound guided thoracentesis was thoroughly discussed with the patient and questions answered. The benefits, risks, alternatives and complications were also discussed. The patient understands and wishes to proceed with the procedure. Written consent was obtained. Ultrasound was performed to localize and mark an adequate pocket of fluid in the left chest. The area was then prepped and draped in the normal sterile fashion. 1% Lidocaine was used for local anesthesia. Under ultrasound guidance a Safe-T-Centesis  catheter was introduced. Thoracentesis was performed. The catheter was removed and a dressing applied. FINDINGS: A total of approximately 2.2 L of hazy, amber colored fluid was removed. Samples were sent to the laboratory as requested by the clinical team. IMPRESSION: Successful ultrasound guided left thoracentesis yielding 2.2 L of pleural fluid. Read by: Ascencion Dike PA-C Electronically Signed   By: Aletta Edouard M.D.   On: 01/30/2016 10:14     LOS: 4 days   Oren Binet, MD  [facility] Pager:[phone]  If 7PM-7AM, please contact night-coverage [email or web address] Password [facility] 02/21/2016, 8:19 AM

## 2016-02-21 NOTE — Consult Note (Signed)
Neurology Consult Note  Reason for Consultation: Headache  Requesting provider: Oren Binet, MD  CC: headache   HPI: This is an 66-yo RH man who was admitted on 02/17/16 with fever, headache, and generalized weakness of five days' duration. This was associated with nausea and vomiting, anorexia, ear discomfort, SOB, cough, and mild confusion. He was admitted with a diagnosis of sepsis and placed on cefepime, ampicillin, and vancomycin to cover empirically against possible meningitis. He could not have LP performed due to thrombocytopenia and the fact that he was taking Plavix. Headache quickly improved after admission. CTH showed mastoiditis and he was noted to have L otitis media. His case was discussed with ENT; they did not feel that the L mastoid effusion was significant and suggested changing antibiotic coverage to clindamycin or Unasyn. He was placed on Unasyn on 02/19/16. Symptoms have improved significantly since admission.   He reports to me that his headache was largely left-sided. It started around the left ear and radiated around the front of the head, over the top of the head, and then to the back of the head. There was no associated phonophobia or nausea. He did have mild photophobia. There were no associated neurologic symptoms such as numbness, tingling, vertigo, weakness, or vision loss. His headache is well-controlled with PRN Tylenol which eliminates the pain completely. He and his family endorse that he has a long-standing history of headache for many years. These started as a young man after he was struck in the head by a belt on a piece of machinery, resulting in a skull fracture. His current headache is identical to his chronic headaches with the exception that is was more intense than usual. He usually has 2-3 headaches per month and they are easily treated with Tylenol.   His wife reports that he had a stroke about five years ago that resulted in some left facial droop and  weakness of the right arm and leg. He reports that he has not had any residual deficits from the stroke.   PMH:  Past Medical History:  Diagnosis Date  . Adenomatous colon polyp   . CAD (coronary artery disease)    30% LAD Stenosis, 70% ramus intermedius stenosis, treated with PTCA and angioplasty by Dr Albertine Patricia 2004  . Cataract    right eye  . Cough, persistent 11/04/2015  . Deficiency anemia 04/19/2014  . Diverticulosis   . DM (diabetes mellitus) ([place])   . DVT (deep venous thrombosis) ([place])    secondary to surgery  . Dyslipidemia   . Headache    occ  . Hyperlipidemia   . Hypertension   . Inguinal hernia    right  . 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 Microcytic anemia 07/07/2015  . 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 Non Hodgkin's lymphoma ([place])   . Pleural effusion, left 11/04/2015  . Pneumonia   . Prostate CA ([place]) 09/10/2011   Gleason 3+3 R, 3+4 L lobe May 2007 Rx Radioactive seed implants Dr [name]  . Prostate cancer ([place])    seed implants no recurrence  . PVD (peripheral vascular disease) ([place])    rt renal artery stent  . Renal insufficiency   . Shortness of breath dyspnea   . Thrombocytopenia ([place])   . 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 Vitamin D deficiency  PSH:  Past Surgical History:  Procedure Laterality Date  . APPENDECTOMY     patient ?  Marland Kitchen CARDIAC CATHETERIZATION    . CHEST TUBE INSERTION Left 02/10/2016   Procedure: INSERTION PLEURAL DRAINAGE CATHETER;  Surgeon: [name], MD;  Location: [place];  Service: Thoracic;  Laterality: Left;  . COLONOSCOPY    . CORONARY ANGIOPLASTY    . CYSTOURETHROSCOPY     ROBOTIC ARM NUCLETRON SEED IMPLANTATION OF PROSTATE  . EXPLORATORY LAPAROTOMY     For evaluation of lymphoma    Family history: Family History  Problem Relation Age of Onset  .  Lymphoma Sister   . Prostate cancer Brother   . Breast cancer Sister   . Diabetes Brother   . Diabetes Sister   . Heart disease Brother   . Asthma Son     Social history:  Social History   Social History  . Marital status: Married    Spouse name: N/A  . Number of children: 4  . Years of education: N/A   Occupational History  . retired    Social History Main Topics  . Smoking status: Former Smoker    Packs/day: 0.50    Years: 20.00    Types: Pipe, Cigarettes    Quit date: 06/21/1958  . Smokeless tobacco: Never Used  . Alcohol use No  . Drug use: No  . Sexual activity: Yes    Partners: Female   Other Topics Concern  . Not on file   Social History Narrative  . No narrative on file    Current outpatient meds: Current Facility-Administered Medications for the 02/17/16 encounter [facility] Encounter)  Medication  . cyanocobalamin ((VITAMIN B-12)) injection 1,000 mcg   Current Meds  Medication Sig  . acetaminophen (TYLENOL) 500 MG tablet Take 500-1,000 mg by mouth every 4 (four) hours as needed for mild pain or moderate pain.   Marland Kitchen clopidogrel (PLAVIX) 75 MG tablet TAKE 1 TABLET BY MOUTH DAILY WITH BREAKFAST.  Marland Kitchen Cyanocobalamin (VITAMIN B-12 IJ) Inject 1 mL as directed every 30 (thirty) days.  Marland Kitchen doxazosin (CARDURA) 4 MG tablet TAKE 1 TABLET BY MOUTH ONCE A DAY  . eltrombopag (PROMACTA) 50 MG tablet Take 1 tablet (50 mg total) by mouth daily. Take on an empty stomach 1 hour before a meal or 2 hours after  . ferrous sulfate 325 (65 FE) MG tablet Take 325 mg by mouth daily with breakfast.  . LANTUS SOLOSTAR 100 UNIT/ML Solostar Pen INJECT 25 UNITS INTO THE SKIN AT BEDTIME. (Patient taking differently: INJECT 15 UNITS INTO THE SKIN EACH MORNING)  . meclizine (ANTIVERT) 12.5 MG tablet Take 1 tablet (12.5 mg total) by mouth 3 (three) times daily as needed for dizziness.  . metoCLOPramide (REGLAN) 5 MG tablet Take 1 tablet (5 mg total) by mouth 3 (three) times daily before meals.  (Patient taking differently: Take 5 mg by mouth 3 (three) times daily as needed. )  . metoprolol succinate (TOPROL-XL) 100 MG 24 hr tablet TAKE 1 TABLET BY MOUTH ONCE A DAY  . Multiple Vitamins-Minerals (MULTIVITAMIN ADULT PO) Take 1 tablet by mouth daily.  . valsartan (DIOVAN) 160 MG tablet Take 1 tablet (160 mg total) by mouth daily.    Current inpatient meds:  Current Facility-Administered Medications  Medication Dose Route Frequency Provider Last Rate Last Dose  . acetaminophen (TYLENOL) tablet 650 mg  650 mg Oral Q6H PRN Norval Morton, MD   650 mg at 02/21/16 0934   Or  . acetaminophen (  TYLENOL) suppository 650 mg  650 mg Rectal Q6H PRN Norval Morton, MD      . albuterol (PROVENTIL) (2.5 MG/3ML) 0.083% nebulizer solution 2.5 mg  2.5 mg Nebulization Q4H PRN Rondell A Smith, MD      . Ampicillin-Sulbactam (UNASYN) 3 g in sodium chloride 0.9 % 100 mL IVPB  3 g Intravenous Q8H Rebecka Apley, RPH   3 g at 02/21/16 0934  . calcium gluconate 1 g in sodium chloride 0.9 % 100 mL IVPB  1 g Intravenous Once Rondell A Tamala Julian, MD      . clopidogrel (PLAVIX) tablet 75 mg  75 mg Oral Daily Norval Morton, MD   75 mg at 02/21/16 0935  . doxazosin (CARDURA) tablet 4 mg  4 mg Oral Daily Norval Morton, MD   4 mg at 02/21/16 0935  . eltrombopag (PROMACTA) tablet 50 mg  50 mg Oral Daily Norval Morton, MD   50 mg at 02/21/16 0934  . feeding supplement (ENSURE ENLIVE) (ENSURE ENLIVE) liquid 237 mL  237 mL Oral Q24H Jonetta Osgood, MD   237 mL at 02/20/16 1731  . ferrous sulfate tablet 325 mg  325 mg Oral Q breakfast Jonetta Osgood, MD   325 mg at 02/21/16 0936  . gabapentin (NEURONTIN) capsule 300 mg  300 mg Oral TID Jonetta Osgood, MD   300 mg at 02/21/16 0936  . HYDROcodone-acetaminophen (NORCO/VICODIN) 5-325 MG per tablet 1 tablet  1 tablet Oral Q6H PRN Norval Morton, MD      . insulin aspart (novoLOG) injection 0-9 Units  0-9 Units Subcutaneous TID WC Norval Morton, MD   2 Units at  02/21/16 1217  . insulin glargine (LANTUS) injection 5 Units  5 Units Subcutaneous QHS Norval Morton, MD   5 Units at 02/20/16 2227  . ipratropium (ATROVENT) nebulizer solution 0.5 mg  0.5 mg Nebulization Q4H PRN Norval Morton, MD      . irbesartan (AVAPRO) tablet 150 mg  150 mg Oral Daily Norval Morton, MD   150 mg at 02/21/16 0935  . meclizine (ANTIVERT) tablet 12.5 mg  12.5 mg Oral TID PRN Norval Morton, MD      . metoCLOPramide (REGLAN) tablet 5 mg  5 mg Oral TID AC Rondell Charmayne Sheer, MD   5 mg at 02/21/16 0936  . metoprolol succinate (TOPROL-XL) 24 hr tablet 100 mg  100 mg Oral Daily Norval Morton, MD   100 mg at 02/21/16 0934  . multivitamin with minerals tablet 1 tablet  1 tablet Oral Daily Norval Morton, MD   1 tablet at 02/21/16 0936  . ondansetron (ZOFRAN) tablet 4 mg  4 mg Oral Q6H PRN Norval Morton, MD       Or  . ondansetron (ZOFRAN) injection 4 mg  4 mg Intravenous Q6H PRN Rondell A Tamala Julian, MD      . potassium chloride SA (K-DUR,KLOR-CON) CR tablet 20 mEq  20 mEq Oral BID Norval Morton, MD   20 mEq at 02/21/16 0934    Allergies: Allergies  Allergen Reactions  . Glucophage [Metformin Hydrochloride] Other (See Comments)    Chest pain  . Zetia [Ezetimibe] Other (See Comments)    weakness  . Fenofibrate Rash  . Niacin-Lovastatin Er Rash    ROS: As per HPI. A full 14-point review of systems was performed and is otherwise notable for some tenderness around the left ear. No other complaints reported at  this time.   PE:  BP (!) 138/54 (BP Location: Right Arm)   Pulse 67   Temp 97.8 F (36.6 C) (Oral)   Resp 19   Ht 5\' 8"  (1.727 m)   Wt 81.5 kg (179 lb 9.6 oz) Comment: b scale  SpO2 96%   BMI 27.31 kg/m   General: WDWN, no acute distress. AAO x4. Speech clear, no dysarthria. No aphasia. Follows commands briskly. Affect is bright with congruent mood. Comportment is normal.  HEENT: Normocephalic. He reports some mild TTP around the left ear. Neck supple without  LAD. MMM, OP clear. Dentition poor. Sclerae anicteric. No conjunctival injection.  CV: Regular, no murmur. Carotid pulses full and symmetric, no bruits. Distal pulses 2+ and symmetric.  Lungs: CTAB.  Abdomen: Soft, non-distended, non-tender. Bowel sounds present x4.  Extremities: No C/C/E. Neuro:  CN: Pupils are equal and round. They are symmetrically reactive from 3-->2 mm. EOM notable for breakup of smooth pursuits without nystagmus. No reported diplopia. Facial sensation is intact to light touch. There is a left central VII pattern of weakness that is mild. Hearing is decreased to conversational voice. Palate elevates symmetrically and uvula is midline. Voice is normal in tone, pitch and quality. Bilateral SCM and trapezii are 5/5. Tongue is midline with normal bulk and mobility.  Motor: Normal bulk, tone, and strength. No tremor or other abnormal movements. No drift.  Sensation: Intact to light touch.  DTRs: 2+, brisker on the R than the left. Toes downgoing bilaterally. No pathologic reflexes.  Coordination: Finger-to-nose is slower on the R but both sides are without dysmetria. Finger taps are slower on the right side.     Labs:  Lab Results  Component Value Date   WBC 6.4 02/20/2016   HGB 10.6 (L) 02/20/2016   HCT 35.0 (L) 02/20/2016   PLT 56 (L) 02/20/2016   GLUCOSE 127 (H) 02/19/2016   CHOL 119 (L) 07/02/2015   TRIG 248 (H) 07/02/2015   HDL 19 (L) 07/02/2015   LDLCALC 50 07/02/2015   ALT 17 02/18/2016   AST 19 02/18/2016   NA 136 02/19/2016   K 4.1 02/19/2016   CL 105 02/19/2016   CREATININE 1.55 (H) 02/19/2016   BUN 18 02/19/2016   CO2 28 02/19/2016   TSH 0.785 01/20/2011   PSA 0.02 09/18/2013   INR 1.10 02/10/2016   HGBA1C 6.7 (H) 12/29/2015   MICROALBUR 2.4 (H) 10/17/2014   UA 02/17/16: hazy, trace hgb, protein 100 mg/dL Urine cx >100,000 CFUs Enterococcus faecalis Blood cx no growth x3 days Pleural fluid cx no growth x2 days  Imaging:  I have personally and  independently reviewed the [facility] without contrast from 02/17/16. This shows focal areas of encephalomalacia in the right occipital lobe and the left parietal lobe consistent with old infarcts. There is a focal area of hypodensity in the left pons that could represent an old lacunar infarct though could also be artifact. There is moderate burden of chronic small vessel ischemic disease. There is moderate diffuse generalized atrophy.   Assessment and Plan:  1. Headache: This is non-specific in nature, not clearly fitting any of the primary headache syndromes. These are long-standing and have been present for many years. His current headaches are essentially the same as his chronic headache with the exception of increased intensity, which is not uncommon in the face of acute illness. In his case, otitis is likely to blame for this. Currently he is not endorsing any headache. I would anticipate that his  headaches will return to their normal character and frequency over the next few days to weeks. With a background frequency of 2-3 headaches per month, there is no role for prophylactic treatment. His headaches are very responsive to Tylenol and he reports that this usually alleviates the pain completely so I would continue PRN Tylenol to treat.   2. Cerebrovascular disease: This is chronic with no evidence of acute ischemia. He has history of stroke with imaging showing both old cortical infarcts and small vessel disease. Known risk factors for cerebrovascular disease/stroke include afib (not on anticoagulation), CAD, DM, dyslipidemia, HTN, prior stroke, and age. Continue risk factor modification. He was on Plavix at home and this can be continued for secondary prevention. He is not on anticoagulation despite h/o afib.   3. Right hemiparesis: This is chronic, consistent with prior infarction. No acute issues.   This was discussed with the patient, his wife, and his daughter at the time of my visit. They are in  agreement with the plan as stated. They were given the opportunity to ask any questions and these were addressed to their satisfaction.   I have no additional recommendations at this time and will sign off. Call if any new issues arise.

## 2016-02-21 NOTE — Care Management (Signed)
CM met with patient and family concerning discharge home and recommendations to resume [facility] services. Patient and family are agreeable to transitional care plan. Patient is active with [facility] with HHRN, PT. Liaison for [place] contacted to confirm services.  Explained that the [facility] nurse will contact you at the verified phone number to set up next visit. Patient and family verbalized understanding and teach back done. No further questions or concerns verbalized.  Contact information was reviewed and place on the AVS.

## 2016-02-21 NOTE — Progress Notes (Signed)
Discharge instructions given. Pt verbalized understanding and all questions were answered.  

## 2016-02-22 LAB — CULTURE, BLOOD (ROUTINE X 2)
Culture: NO GROWTH
Culture: NO GROWTH

## 2016-02-23 DIAGNOSIS — E119 Type 2 diabetes mellitus without complications: Secondary | ICD-10-CM | POA: Diagnosis not present

## 2016-02-23 DIAGNOSIS — J90 Pleural effusion, not elsewhere classified: Secondary | ICD-10-CM | POA: Diagnosis not present

## 2016-02-23 DIAGNOSIS — Z4682 Encounter for fitting and adjustment of non-vascular catheter: Secondary | ICD-10-CM | POA: Diagnosis not present

## 2016-02-23 DIAGNOSIS — I10 Essential (primary) hypertension: Secondary | ICD-10-CM | POA: Diagnosis not present

## 2016-02-23 DIAGNOSIS — D696 Thrombocytopenia, unspecified: Secondary | ICD-10-CM | POA: Diagnosis not present

## 2016-02-23 DIAGNOSIS — D469 Myelodysplastic syndrome, unspecified: Secondary | ICD-10-CM | POA: Diagnosis not present

## 2016-02-23 LAB — CULTURE, BODY FLUID-BOTTLE: CULTURE: NO GROWTH

## 2016-02-25 ENCOUNTER — Encounter: Payer: Self-pay | Admitting: Podiatry

## 2016-02-25 ENCOUNTER — Ambulatory Visit (INDEPENDENT_AMBULATORY_CARE_PROVIDER_SITE_OTHER): Payer: Medicare Other | Admitting: Podiatry

## 2016-02-25 DIAGNOSIS — B351 Tinea unguium: Secondary | ICD-10-CM

## 2016-02-25 DIAGNOSIS — M79676 Pain in unspecified toe(s): Secondary | ICD-10-CM | POA: Diagnosis not present

## 2016-02-25 NOTE — Progress Notes (Signed)
Patient ID: Ricardo Watkins, male   DOB: Nov 12, 1930, 82 y.o.   MRN: [id number]    Subjective: This patient presents for scheduled visit and request debridement of painful toenails which are uncomfortable when walking wearing shoes. The patient's wife is present in the treatment room today  Objective: No open skin lesions bilaterally DP pulses 0/4 bilaterally PT pulses 1/4 bilaterally The toenails are hypertrophic, elongated, incurvated, discolored and tender direct palpation 6-10  Assessment: Diabetic Symptomatic onychomycoses 6-10  Plan: Debridement toenails 10 and mechanically and electrically without any bleeding   Reappoint 3 months

## 2016-02-25 NOTE — Patient Instructions (Signed)
Diabetes and Foot Care Diabetes may cause you to have problems because of poor blood supply (circulation) to your feet and legs. This may cause the skin on your feet to become thinner, break easier, and heal more slowly. Your skin may become dry, and the skin may peel and crack. You may also have nerve damage in your legs and feet causing decreased feeling in them. You may not notice minor injuries to your feet that could lead to infections or more serious problems. Taking care of your feet is one of the most important things you can do for yourself.  HOME CARE INSTRUCTIONS  Wear shoes at all times, even in the house. Do not go barefoot. Bare feet are easily injured.  Check your feet daily for blisters, cuts, and redness. If you cannot see the bottom of your feet, use a mirror or ask someone for help.  Wash your feet with warm water (do not use hot water) and mild soap. Then pat your feet and the areas between your toes until they are completely dry. Do not soak your feet as this can dry your skin.  Apply a moisturizing lotion or petroleum jelly (that does not contain alcohol and is unscented) to the skin on your feet and to dry, brittle toenails. Do not apply lotion between your toes.  Trim your toenails straight across. Do not dig under them or around the cuticle. File the edges of your nails with an emery board or nail file.  Do not cut corns or calluses or try to remove them with medicine.  Wear clean socks or stockings every day. Make sure they are not too tight. Do not wear knee-high stockings since they may decrease blood flow to your legs.  Wear shoes that fit properly and have enough cushioning. To break in new shoes, wear them for just a few hours a day. This prevents you from injuring your feet. Always look in your shoes before you put them on to be sure there are no objects inside.  Do not cross your legs. This may decrease the blood flow to your feet.  If you find a minor scrape,  cut, or break in the skin on your feet, keep it and the skin around it clean and dry. These areas may be cleansed with mild soap and water. Do not cleanse the area with peroxide, alcohol, or iodine.  When you remove an adhesive bandage, be sure not to damage the skin around it.  If you have a wound, look at it several times a day to make sure it is healing.  Do not use heating pads or hot water bottles. They may burn your skin. If you have lost feeling in your feet or legs, you may not know it is happening until it is too late.  Make sure your health care provider performs a complete foot exam at least annually or more often if you have foot problems. Report any cuts, sores, or bruises to your health care provider immediately. SEEK MEDICAL CARE IF:   You have an injury that is not healing.  You have cuts or breaks in the skin.  You have an ingrown nail.  You notice redness on your legs or feet.  You feel burning or tingling in your legs or feet.  You have pain or cramps in your legs and feet.  Your legs or feet are numb.  Your feet always feel cold. SEEK IMMEDIATE MEDICAL CARE IF:   There is increasing redness,   swelling, or pain in or around a wound.  There is a red line that goes up your leg.  Pus is coming from a wound.  You develop a fever or as directed by your health care provider.  You notice a bad smell coming from an ulcer or wound.   This information is not intended to replace advice given to you by your health care provider. Make sure you discuss any questions you have with your health care provider.   Document Released: 06/04/2000 Document Revised: 02/07/2013 Document Reviewed: 11/14/2012 Elsevier Interactive Patient Education 2016 [employer]  

## 2016-02-26 DIAGNOSIS — E119 Type 2 diabetes mellitus without complications: Secondary | ICD-10-CM | POA: Diagnosis not present

## 2016-02-26 DIAGNOSIS — Z4682 Encounter for fitting and adjustment of non-vascular catheter: Secondary | ICD-10-CM | POA: Diagnosis not present

## 2016-02-26 DIAGNOSIS — D469 Myelodysplastic syndrome, unspecified: Secondary | ICD-10-CM | POA: Diagnosis not present

## 2016-02-26 DIAGNOSIS — D696 Thrombocytopenia, unspecified: Secondary | ICD-10-CM | POA: Diagnosis not present

## 2016-02-26 DIAGNOSIS — J90 Pleural effusion, not elsewhere classified: Secondary | ICD-10-CM | POA: Diagnosis not present

## 2016-02-26 DIAGNOSIS — I10 Essential (primary) hypertension: Secondary | ICD-10-CM | POA: Diagnosis not present

## 2016-02-27 ENCOUNTER — Encounter: Payer: Self-pay | Admitting: Family Medicine

## 2016-02-27 ENCOUNTER — Ambulatory Visit (INDEPENDENT_AMBULATORY_CARE_PROVIDER_SITE_OTHER): Payer: Medicare Other | Admitting: Family Medicine

## 2016-02-27 VITALS — BP 120/58 | HR 76 | Temp 97.6°F | Resp 14 | Ht 69.0 in | Wt 187.0 lb

## 2016-02-27 DIAGNOSIS — I2510 Atherosclerotic heart disease of native coronary artery without angina pectoris: Secondary | ICD-10-CM

## 2016-02-27 DIAGNOSIS — D519 Vitamin B12 deficiency anemia, unspecified: Secondary | ICD-10-CM

## 2016-02-27 DIAGNOSIS — Z23 Encounter for immunization: Secondary | ICD-10-CM | POA: Diagnosis not present

## 2016-02-27 DIAGNOSIS — H66002 Acute suppurative otitis media without spontaneous rupture of ear drum, left ear: Secondary | ICD-10-CM | POA: Diagnosis not present

## 2016-02-27 DIAGNOSIS — N390 Urinary tract infection, site not specified: Secondary | ICD-10-CM

## 2016-02-27 DIAGNOSIS — E041 Nontoxic single thyroid nodule: Secondary | ICD-10-CM | POA: Diagnosis not present

## 2016-02-27 MED ORDER — CEFDINIR 300 MG PO CAPS: 300.0000 mg | ORAL_CAPSULE | Freq: Two times a day (BID) | ORAL | 0 refills | Status: DC

## 2016-02-27 NOTE — Progress Notes (Signed)
Subjective:    Patient ID: Ricardo Watkins, male    DOB: 1930/08/29, 82 y.o.   MRN: [id number]  HPI 02/17/16 On August 22, the patient had a pleural drainage catheter placed as an outpatient due to a  recurrent left pleural effusion.  Starting this weekend, he developed fevers, rigors, chills, cough, and mild sob.  He presents today to the office with shaking chills, subjective fevers, weakness, and shortness of breath. He denies any chest pain. He denies any nausea or vomiting. He denies any dysuria or hematuria. He denies any diarrhea. He has had bloody drainage coming from his pleural catheter. The family denies any purulent appearing drainage or foul-smelling drainage. There is no erythema around the catheter site. On examination today of his lungs, his lungs sound clear although his breath sounds are markedly diminished bilaterally. However the patient appears ill and dehydrated. At that time, my plan was: Given the recent radical placement of a Pleurx catheter, I am concerned about possible infection in the pleural space versus pneumonia. Patient appears ill. I'm unable to obtain a CBC today in office. I'm unable to obtain a chest x-ray this late in the evening. Therefore I recommended going to the emergency room for further evaluation to evaluate for the source of his illness. Given his advanced age, his compromised immune system, his history of malignancy, I believe this is the most appropriate course of action for a possible pleural space infection 02/27/16 Patient was admitted to the hospital. Head CT revealed a left otitis media with mastoid effusion. Blood cultures were clear. Pleural cultures were clear. Urine culture revealed enterococcus even though initial urinalysis was negative. He completed 5 days of antibiotics in the hospital and was discharged on Augmentin for an additional 3 days to complete 8 days of therapy. He is doing much better today. My suspicion is he may have had prostatitis.  This concerns me that he has not had an adequate duration of antibiotics. Furthermore on his exam today, his left tympanic membrane is still erythematous with an effusion and he continues to have some pain in his left ear. He is scheduled to see his ENT physician next week. He is off antibiotics now for 48 hours. There was also a coincidental finding of a 2.3 cm left thyroid nodule. This would meet consensus guidelines for a needle biopsy since the patient is not hyperthyroid. Past Medical History:  Diagnosis Date  . Adenomatous colon polyp   . CAD (coronary artery disease)    30% LAD Stenosis, 70% ramus intermedius stenosis, treated with PTCA and angioplasty by Dr Albertine Patricia 2004  . Cataract    right eye  . Cough, persistent 11/04/2015  . Deficiency anemia 04/19/2014  . Diverticulosis   . DM (diabetes mellitus) ([place])   . DVT (deep venous thrombosis) ([place])    secondary to surgery  . Dyslipidemia   . Headache    occ  . Hyperlipidemia   . Hypertension   . Inguinal hernia    right  . 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 Microcytic anemia 07/07/2015  . 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 Non Hodgkin's lymphoma ([place])   . Pleural effusion, left 11/04/2015  . Pneumonia   . 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 Prostate cancer ([place])    seed implants no recurrence  . PVD (peripheral vascular disease) ([place])  rt renal artery stent  . Renal insufficiency   . Shortness of breath dyspnea   . Thrombocytopenia ([place])   . Thrombotic stroke ([place]) 09/10/2011   January 18, 2011 infarct genu & post limb R internal capsule - acute; previous lacunar infarcts/extensive white matter dis  . Vitamin D deficiency    Past Surgical History:  Procedure Laterality Date  . APPENDECTOMY     patient ?  Marland Kitchen CARDIAC CATHETERIZATION    . CHEST TUBE INSERTION Left 02/10/2016   Procedure: INSERTION PLEURAL  DRAINAGE CATHETER;  Surgeon: [name], MD;  Location: [place];  Service: Thoracic;  Laterality: Left;  . COLONOSCOPY    . CORONARY ANGIOPLASTY    . CYSTOURETHROSCOPY     ROBOTIC ARM NUCLETRON SEED IMPLANTATION OF PROSTATE  . EXPLORATORY LAPAROTOMY     For evaluation of lymphoma   Current Outpatient Prescriptions on File Prior to Visit  Medication Sig Dispense Refill  . acetaminophen (TYLENOL) 500 MG tablet Take 500-1,000 mg by mouth every 4 (four) hours as needed for mild pain or moderate pain.     Marland Kitchen clopidogrel (PLAVIX) 75 MG tablet TAKE 1 TABLET BY MOUTH DAILY WITH BREAKFAST. 90 tablet 4  . Cyanocobalamin (VITAMIN B-12 IJ) Inject 1 mL as directed every 30 (thirty) days.    Marland Kitchen doxazosin (CARDURA) 4 MG tablet TAKE 1 TABLET BY MOUTH ONCE A DAY 30 tablet 3  . eltrombopag (PROMACTA) 50 MG tablet Take 1 tablet (50 mg total) by mouth daily. Take on an empty stomach 1 hour before a meal or 2 hours after 30 tablet 6  . ferrous sulfate 325 (65 FE) MG tablet Take 325 mg by mouth daily with breakfast.    . glucose blood test strip 1 each by Other route daily. Fasting each morning 100 each 3  . Insulin Glargine (LANTUS SOLOSTAR) 100 UNIT/ML Solostar Pen INJECT 15 UNITS INTO THE SKIN EACH MORNING    . meclizine (ANTIVERT) 12.5 MG tablet Take 1 tablet (12.5 mg total) by mouth 3 (three) times daily as needed for dizziness. 30 tablet 0  . metoCLOPramide (REGLAN) 5 MG tablet Take 1 tablet (5 mg total) by mouth 3 (three) times daily before meals. (Patient taking differently: Take 5 mg by mouth 3 (three) times daily as needed. ) 90 tablet 0  . metoprolol succinate (TOPROL-XL) 100 MG 24 hr tablet TAKE 1 TABLET BY MOUTH ONCE A DAY 90 tablet 3  . Multiple Vitamins-Minerals (MULTIVITAMIN ADULT PO) Take 1 tablet by mouth daily.    . valsartan (DIOVAN) 160 MG tablet Take 1 tablet (160 mg total) by mouth daily. 30 tablet 11   Current Facility-Administered Medications on File Prior to Visit  Medication Dose Route  Frequency Provider Last Rate Last Dose  . cyanocobalamin ((VITAMIN B-12)) injection 1,000 mcg  1,000 mcg Intramuscular Q30 days [name], MD   1,000 mcg at 01/29/16 1401   Allergies  Allergen Reactions  . Glucophage [Metformin Hydrochloride] Other (See Comments)    Chest pain  . Zetia [Ezetimibe] Other (See Comments)    weakness  . Fenofibrate Rash  . Niacin-Lovastatin Er Rash   Social History   Social History  . Marital status: Married    Spouse name: N/A  . Number of children: 4  . Years of education: N/A   Occupational History  . retired    Social History Main Topics  . Smoking status: Former Smoker    Packs/day: 0.50    Years: 20.00  Types: Pipe, Cigarettes    Quit date: 06/21/1958  . Smokeless tobacco: Never Used  . Alcohol use No  . Drug use: No  . Sexual activity: Yes    Partners: Female   Other Topics Concern  . Not on file   Social History Narrative  . No narrative on file     Review of Systems  All other systems reviewed and are negative.      Objective:   Physical Exam  Constitutional: He does not appear ill. No distress.  HENT:  Right Ear: Tympanic membrane, external ear and ear canal normal.  Left Ear: External ear and ear canal normal. Tympanic membrane is erythematous and bulging.  Nose: Nose normal.  Mouth/Throat: Oropharynx is clear and moist. No oropharyngeal exudate.  Cardiovascular: Normal rate, regular rhythm and normal heart sounds.   Pulmonary/Chest: Effort normal. He has decreased breath sounds. He has no wheezes. He has no rales.  Abdominal: Normal appearance and bowel sounds are normal.  Skin: He is not diaphoretic.          Assessment & Plan:  Left acute suppurative otitis media - Plan: cefdinir (OMNICEF) 300 MG capsule  Urinary tract infection, site not specified - Plan: Urine culture  Repeat urine culture to ensure that there is no persistent bacteriuria requiring longer duration of antibiotic therapy.  Typically treat prostatitis for a total of 2 weeks. However his left eardrum appears to a failed Augmentin as it is still erythematous with an effusion. Begin Omnicef 300 mg by mouth twice a day for 10 days. Follow-up with his ENT physician next week to determine if he needs tube placement. I will arrange ultrasound-guided thyroid biopsy.

## 2016-02-27 NOTE — Addendum Note (Signed)
Addended by: Shary Decamp B on: 02/27/2016 03:20 PM   Modules accepted: Orders

## 2016-02-29 LAB — URINE CULTURE: Organism ID, Bacteria: NO GROWTH

## 2016-03-02 ENCOUNTER — Encounter: Payer: Self-pay | Admitting: Hematology and Oncology

## 2016-03-02 ENCOUNTER — Telehealth: Payer: Self-pay | Admitting: Hematology and Oncology

## 2016-03-02 ENCOUNTER — Ambulatory Visit (HOSPITAL_BASED_OUTPATIENT_CLINIC_OR_DEPARTMENT_OTHER): Payer: Medicare Other | Admitting: Hematology and Oncology

## 2016-03-02 ENCOUNTER — Other Ambulatory Visit (HOSPITAL_BASED_OUTPATIENT_CLINIC_OR_DEPARTMENT_OTHER): Payer: Medicare Other

## 2016-03-02 ENCOUNTER — Other Ambulatory Visit: Payer: Self-pay | Admitting: Cardiothoracic Surgery

## 2016-03-02 ENCOUNTER — Other Ambulatory Visit: Payer: Self-pay | Admitting: Hematology and Oncology

## 2016-03-02 VITALS — BP 123/58 | HR 76 | Temp 97.8°F | Resp 18 | Wt 188.4 lb

## 2016-03-02 DIAGNOSIS — E041 Nontoxic single thyroid nodule: Secondary | ICD-10-CM | POA: Diagnosis not present

## 2016-03-02 DIAGNOSIS — J90 Pleural effusion, not elsewhere classified: Secondary | ICD-10-CM

## 2016-03-02 DIAGNOSIS — D46C Myelodysplastic syndrome with isolated del(5q) chromosomal abnormality: Secondary | ICD-10-CM

## 2016-03-02 LAB — CBC & DIFF AND RETIC
BASO%: 2.5 % — ABNORMAL HIGH (ref 0.0–2.0)
Basophils Absolute: 0.2 10*3/uL — ABNORMAL HIGH (ref 0.0–0.1)
EOS ABS: 0 10*3/uL (ref 0.0–0.5)
EOS%: 0.3 % (ref 0.0–7.0)
HCT: 34.9 % — ABNORMAL LOW (ref 38.4–49.9)
HEMOGLOBIN: 10.8 g/dL — AB (ref 13.0–17.1)
IMMATURE RETIC FRACT: 18.6 % — AB (ref 3.00–10.60)
LYMPH%: 35.7 % (ref 14.0–49.0)
MCH: 23.9 pg — ABNORMAL LOW (ref 27.2–33.4)
MCHC: 31 g/dL — ABNORMAL LOW (ref 32.0–36.0)
MCV: 77.3 fL — AB (ref 79.3–98.0)
MONO#: 0.7 10*3/uL (ref 0.1–0.9)
MONO%: 11 % (ref 0.0–14.0)
NEUT%: 50.5 % (ref 39.0–75.0)
NEUTROS ABS: 3.2 10*3/uL (ref 1.5–6.5)
RBC: 4.51 10*6/uL (ref 4.20–5.82)
RDW: 24.7 % — ABNORMAL HIGH (ref 11.0–14.6)
Retic %: 1.04 % (ref 0.80–1.80)
Retic Ct Abs: 46.9 10*3/uL (ref 34.80–93.90)
WBC: 6.3 10*3/uL (ref 4.0–10.3)
lymph#: 2.3 10*3/uL (ref 0.9–3.3)

## 2016-03-02 LAB — COMPREHENSIVE METABOLIC PANEL
ALBUMIN: 2.6 g/dL — AB (ref 3.5–5.0)
ALK PHOS: 84 U/L (ref 40–150)
ALT: 29 U/L (ref 0–55)
AST: 21 U/L (ref 5–34)
Anion Gap: 8 mEq/L (ref 3–11)
BUN: 20.9 mg/dL (ref 7.0–26.0)
CO2: 25 mEq/L (ref 22–29)
CREATININE: 1.8 mg/dL — AB (ref 0.7–1.3)
Calcium: 8.4 mg/dL (ref 8.4–10.4)
Chloride: 105 mEq/L (ref 98–109)
EGFR: 35 mL/min/{1.73_m2} — AB (ref >90)
GLUCOSE: 163 mg/dL — AB (ref 70–140)
Potassium: 4.6 mEq/L (ref 3.5–5.1)
SODIUM: 138 meq/L (ref 136–145)
TOTAL PROTEIN: 8.2 g/dL (ref 6.4–8.3)

## 2016-03-02 LAB — TECHNOLOGIST REVIEW

## 2016-03-02 NOTE — Telephone Encounter (Signed)
Avs report and schedule given per 03/02/16 los. °

## 2016-03-03 ENCOUNTER — Encounter: Payer: Self-pay | Admitting: Cardiothoracic Surgery

## 2016-03-03 ENCOUNTER — Ambulatory Visit
Admission: RE | Admit: 2016-03-03 | Discharge: 2016-03-03 | Disposition: A | Discharge status: Home / Self Care | Payer: Medicare Other | Source: Ambulatory Visit | Attending: Cardiothoracic Surgery | Admitting: Cardiothoracic Surgery

## 2016-03-03 ENCOUNTER — Ambulatory Visit (INDEPENDENT_AMBULATORY_CARE_PROVIDER_SITE_OTHER): Payer: Medicare Other | Admitting: Cardiothoracic Surgery

## 2016-03-03 VITALS — BP 132/70 | HR 72 | Resp 16 | Ht 69.0 in | Wt 188.0 lb

## 2016-03-03 DIAGNOSIS — Z789 Other specified health status: Secondary | ICD-10-CM

## 2016-03-03 DIAGNOSIS — E041 Nontoxic single thyroid nodule: Secondary | ICD-10-CM | POA: Insufficient documentation

## 2016-03-03 DIAGNOSIS — J90 Pleural effusion, not elsewhere classified: Secondary | ICD-10-CM

## 2016-03-03 DIAGNOSIS — R05 Cough: Secondary | ICD-10-CM | POA: Diagnosis not present

## 2016-03-03 DIAGNOSIS — I2510 Atherosclerotic heart disease of native coronary artery without angina pectoris: Secondary | ICD-10-CM

## 2016-03-03 DIAGNOSIS — J948 Other specified pleural conditions: Secondary | ICD-10-CM | POA: Diagnosis not present

## 2016-03-03 DIAGNOSIS — Z9689 Presence of other specified functional implants: Secondary | ICD-10-CM

## 2016-03-03 NOTE — Assessment & Plan Note (Signed)
The cause of pleural effusion is unknown. The patient will continue close follow-up with CT surgery for management

## 2016-03-03 NOTE — Progress Notes (Signed)
PCP is [name], MD Referring Provider is [name], MD  Chief Complaint  Patient presents with  . Routine Post Op     f/u from surgery with CXR s/p Placement of left PleurX catheter, 02/10/16.Marland Kitchendraing M-W-F    HPI: First office visit for follow-up of left Pleurx catheter placement. He has a nonmalignant recurrent pleural effusion Patient has past history of non-Hodgkin's lymphoma, treated, now with myelodysplastic syndrome. He also has heart failure and nondialysis renal failure. Pleurx catheter drainage is Monday Wednesday Friday. Drainage started out thousand cc now is down to 500 cc Symptoms of shortness of breath have significantly improved. Patient still has cough at night which interferes with sleep and will be given some Tussionex for prn use. Past Medical History:  Diagnosis Date  . Adenomatous colon polyp   . CAD (coronary artery disease)    30% LAD Stenosis, 70% ramus intermedius stenosis, treated with PTCA and angioplasty by Dr Albertine Patricia 2004  . Cataract    right eye  . Cough, persistent 11/04/2015  . Deficiency anemia 04/19/2014  . Diverticulosis   . DM (diabetes mellitus) ([place])   . DVT (deep venous thrombosis) ([place])    secondary to surgery  . Dyslipidemia   . Headache    occ  . Hyperlipidemia   . Hypertension   . Inguinal hernia    right  . 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 Microcytic anemia 07/07/2015  . 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 Non Hodgkin's lymphoma ([place])   . Pleural effusion, left 11/04/2015  . Pneumonia   . Prostate CA ([place]) 09/10/2011   Gleason 3+3 R, 3+4 L lobe May 2007 Rx Radioactive seed implants Dr [name]  . Prostate cancer ([place])    seed implants no recurrence  . PVD (peripheral vascular disease) ([place])    rt renal artery stent  . Renal insufficiency   . Shortness of breath dyspnea   . Thrombocytopenia ([place])   . 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 Vitamin D deficiency     Past Surgical History:  Procedure Laterality Date  . APPENDECTOMY     patient ?  Marland Kitchen CARDIAC CATHETERIZATION    . CHEST TUBE INSERTION Left 02/10/2016   Procedure: INSERTION PLEURAL DRAINAGE CATHETER;  Surgeon: [name], MD;  Location: [facility];  Service: Thoracic;  Laterality: Left;  . COLONOSCOPY    . CORONARY ANGIOPLASTY    . CYSTOURETHROSCOPY     ROBOTIC ARM NUCLETRON SEED IMPLANTATION OF PROSTATE  . EXPLORATORY LAPAROTOMY     For evaluation of lymphoma    Family History  Problem Relation Age of Onset  . Lymphoma Sister   . Prostate cancer Brother   . Breast cancer Sister   . Diabetes Brother   . Diabetes Sister   . Heart disease Brother   . Asthma Son     Social History Social History  Substance Use Topics  . Smoking status: Former Smoker    Packs/day: 0.50    Years: 20.00    Types: Pipe, Cigarettes    Quit date: 06/21/1958  . Smokeless tobacco: Never Used  . Alcohol use No    Current Outpatient Prescriptions  Medication Sig Dispense Refill  . acetaminophen (TYLENOL) 500 MG tablet Take 500-1,000 mg by mouth every 4 (four) hours as needed for mild  pain or moderate pain.     . cefdinir (OMNICEF) 300 MG capsule Take 1 capsule (300 mg total) by mouth 2 (two) times daily. 20 capsule 0  . clopidogrel (PLAVIX) 75 MG tablet TAKE 1 TABLET BY MOUTH DAILY WITH BREAKFAST. 90 tablet 4  . Cyanocobalamin (VITAMIN B-12 IJ) Inject 1 mL as directed every 30 (thirty) days.    Marland Kitchen doxazosin (CARDURA) 4 MG tablet TAKE 1 TABLET BY MOUTH ONCE A DAY 30 tablet 3  . eltrombopag (PROMACTA) 50 MG tablet Take 1 tablet (50 mg total) by mouth daily. Take on an empty stomach 1 hour before a meal or 2 hours after 30 tablet 6  . ferrous sulfate 325 (65 FE) MG tablet Take 325 mg by mouth daily with breakfast.    . glucose blood test strip 1 each by Other route daily. Fasting each  morning 100 each 3  . Insulin Glargine (LANTUS SOLOSTAR) 100 UNIT/ML Solostar Pen INJECT 15 UNITS INTO THE SKIN EACH MORNING    . meclizine (ANTIVERT) 12.5 MG tablet Take 1 tablet (12.5 mg total) by mouth 3 (three) times daily as needed for dizziness. 30 tablet 0  . metoCLOPramide (REGLAN) 5 MG tablet Take 1 tablet (5 mg total) by mouth 3 (three) times daily before meals. (Patient taking differently: Take 5 mg by mouth 3 (three) times daily as needed. ) 90 tablet 0  . metoprolol succinate (TOPROL-XL) 100 MG 24 hr tablet TAKE 1 TABLET BY MOUTH ONCE A DAY 90 tablet 3  . Multiple Vitamins-Minerals (MULTIVITAMIN ADULT PO) Take 1 tablet by mouth daily.    . valsartan (DIOVAN) 160 MG tablet Take 1 tablet (160 mg total) by mouth daily. 30 tablet 11   Current Facility-Administered Medications  Medication Dose Route Frequency Provider Last Rate Last Dose  . cyanocobalamin ((VITAMIN B-12)) injection 1,000 mcg  1,000 mcg Intramuscular Q30 days [name], MD   1,000 mcg at 02/27/16 1515    Allergies  Allergen Reactions  . Glucophage [Metformin Hydrochloride] Other (See Comments)    Chest pain  . Zetia [Ezetimibe] Other (See Comments)    weakness  . Fenofibrate Rash  . Niacin-Lovastatin Er Rash    Review of Systems  Weight has been stable Patient has been drinking boost or ensure to replace protein loss No fever or drainage from incisions Family has learning how to do the drainage and dressing changes  BP 132/70   Pulse 72   Resp 16   Ht 5\' 9"  (1.753 m)   Wt 188 lb (85.3 kg)   SpO2 98% Comment: RA  BMI 27.76 kg/m  Physical Exam Breath sounds clear bilaterally Heart rate regular Surgical incisions healing well Sutures removed from insertion incision and from catheter exit site, clean sterile dressing applied  Diagnostic Tests: Chest x-ray clear, Pleurx catheter in good position  Impression: Excellent symptomatic relief of recurrent pleural effusion with Pleurx  catheter Because reduce drainage volumes the drainage schedule will be reduced to twice a week on Monday-Thursday  Plan: Return for review of progress, chest x-ray in 4 weeks   Len Childs, MD [facility] [phone]

## 2016-03-03 NOTE — Progress Notes (Signed)
[facility] OFFICE PROGRESS NOTE  Patient Care Team: [name], MD as PCP - General (Family Medicine) Heath Lark, MD as Consulting Physician (Hematology and Oncology) [name], MD as Consulting Physician (Cardiology)  SUMMARY OF ONCOLOGIC HISTORY: Oncology History   Calculated R-IPSS score at low risk     MDS (myelodysplastic syndrome) with 5q deletion ([place])   11/07/2014 Bone Marrow Biopsy    Accession: [id number] marrow biopsy showed myelodysplastic syndrome with 5q deletion.      04/02/2015 -  Chemotherapy    He started taking Promacta daily for thrombocytopenia       INTERVAL HISTORY: Please see below for problem oriented charting. He returns for follow-up. He was recently hospitalized for multiple issues and just completed antibiotic therapy. The patient have chronic cough since placement of drainage tube over the left chest wall area. He denies recent fever or chills. The patient denies any recent signs or symptoms of bleeding such as spontaneous epistaxis, hematuria or hematochezia. His primary doctor is arranging for thyroid biopsy  REVIEW OF SYSTEMS:   Constitutional: Denies fevers, chills or abnormal weight loss Eyes: Denies blurriness of vision Ears, nose, mouth, throat, and face: Denies mucositis or sore throat Cardiovascular: Denies palpitation, chest discomfort or lower extremity swelling Gastrointestinal:  Denies nausea, heartburn or change in bowel habits Skin: Denies abnormal skin rashes Lymphatics: Denies new lymphadenopathy or easy bruising Neurological:Denies numbness, tingling or new weaknesses Behavioral/Psych: Mood is stable, no new changes  All other systems were reviewed with the patient and are negative.  I have reviewed the past medical history, past surgical history, social history and family history with the patient and they are unchanged from previous note.  ALLERGIES:  is allergic to glucophage [metformin  hydrochloride]; zetia [ezetimibe]; fenofibrate; and niacin-lovastatin er.  MEDICATIONS:  Current Outpatient Prescriptions  Medication Sig Dispense Refill  . acetaminophen (TYLENOL) 500 MG tablet Take 500-1,000 mg by mouth every 4 (four) hours as needed for mild pain or moderate pain.     . cefdinir (OMNICEF) 300 MG capsule Take 1 capsule (300 mg total) by mouth 2 (two) times daily. 20 capsule 0  . clopidogrel (PLAVIX) 75 MG tablet TAKE 1 TABLET BY MOUTH DAILY WITH BREAKFAST. 90 tablet 4  . Cyanocobalamin (VITAMIN B-12 IJ) Inject 1 mL as directed every 30 (thirty) days.    Marland Kitchen doxazosin (CARDURA) 4 MG tablet TAKE 1 TABLET BY MOUTH ONCE A DAY 30 tablet 3  . eltrombopag (PROMACTA) 50 MG tablet Take 1 tablet (50 mg total) by mouth daily. Take on an empty stomach 1 hour before a meal or 2 hours after 30 tablet 6  . ferrous sulfate 325 (65 FE) MG tablet Take 325 mg by mouth daily with breakfast.    . glucose blood test strip 1 each by Other route daily. Fasting each morning 100 each 3  . Insulin Glargine (LANTUS SOLOSTAR) 100 UNIT/ML Solostar Pen INJECT 15 UNITS INTO THE SKIN EACH MORNING    . meclizine (ANTIVERT) 12.5 MG tablet Take 1 tablet (12.5 mg total) by mouth 3 (three) times daily as needed for dizziness. 30 tablet 0  . metoCLOPramide (REGLAN) 5 MG tablet Take 1 tablet (5 mg total) by mouth 3 (three) times daily before meals. (Patient taking differently: Take 5 mg by mouth 3 (three) times daily as needed. ) 90 tablet 0  . metoprolol succinate (TOPROL-XL) 100 MG 24 hr tablet TAKE 1 TABLET BY MOUTH ONCE A DAY 90 tablet 3  . Multiple Vitamins-Minerals (  MULTIVITAMIN ADULT PO) Take 1 tablet by mouth daily.    . valsartan (DIOVAN) 160 MG tablet Take 1 tablet (160 mg total) by mouth daily. 30 tablet 11   Current Facility-Administered Medications  Medication Dose Route Frequency Provider Last Rate Last Dose  . cyanocobalamin ((VITAMIN B-12)) injection 1,000 mcg  1,000 mcg Intramuscular Q30 days [name], MD   1,000 mcg at 02/27/16 1515    PHYSICAL EXAMINATION: ECOG PERFORMANCE STATUS: 1 - Symptomatic but completely ambulatory  Vitals:   03/02/16 1301  BP: (!) 123/58  Pulse: 76  Resp: 18  Temp: 97.8 F (36.6 C)   Filed Weights   03/02/16 1301  Weight: 188 lb 6.4 oz (85.5 kg)    GENERAL:alert, no distress and comfortable SKIN: skin color, texture, turgor are normal, no rashes or significant lesions EYES: normal, Conjunctiva are pink and non-injected, sclera clear OROPHARYNX:no exudate, no erythema and lips, buccal mucosa, and tongue normal  NECK: supple, thyroid normal size, non-tender, without nodularity LYMPH:  no palpable lymphadenopathy in the cervical, axillary or inguinal LUNGS: clear to auscultation and percussion with normal breathing effort. Noted catheter in situ over the left chest wall area HEART: regular rate & rhythm and no murmurs and no lower extremity edema ABDOMEN:abdomen soft, non-tender and normal bowel sounds Musculoskeletal:no cyanosis of digits and no clubbing  NEURO: alert & oriented x 3 with fluent speech, no focal motor/sensory deficits  LABORATORY DATA:  I have reviewed the data as listed    Component Value Date/Time   NA 138 03/02/2016 1238   K 4.6 03/02/2016 1238   CL 105 02/19/2016 0454   CL 107 09/27/2012 0940   CO2 25 03/02/2016 1238   GLUCOSE 163 (H) 03/02/2016 1238   GLUCOSE 89 09/27/2012 0940   BUN 20.9 03/02/2016 1238   CREATININE 1.8 (H) 03/02/2016 1238   CALCIUM 8.4 03/02/2016 1238   PROT 8.2 03/02/2016 1238   ALBUMIN 2.6 (L) 03/02/2016 1238   AST 21 03/02/2016 1238   ALT 29 03/02/2016 1238   ALKPHOS 84 03/02/2016 1238   BILITOT <0.30 03/02/2016 1238   GFRNONAA 40 (L) 02/19/2016 0454   GFRNONAA 49 (L) 07/02/2015 0841   GFRAA 46 (L) 02/19/2016 0454   GFRAA 57 (L) 07/02/2015 0841    No results found for: SPEP, UPEP  Lab Results  Component Value Date   WBC 6.3 03/02/2016   NEUTROABS 3.2 03/02/2016   HGB 10.8  (L) 03/02/2016   HCT 34.9 (L) 03/02/2016   MCV 77.3 (L) 03/02/2016   PLT 131 Large & giant platelets (L) 03/02/2016      Chemistry      Component Value Date/Time   NA 138 03/02/2016 1238   K 4.6 03/02/2016 1238   CL 105 02/19/2016 0454   CL 107 09/27/2012 0940   CO2 25 03/02/2016 1238   BUN 20.9 03/02/2016 1238   CREATININE 1.8 (H) 03/02/2016 1238      Component Value Date/Time   CALCIUM 8.4 03/02/2016 1238   ALKPHOS 84 03/02/2016 1238   AST 21 03/02/2016 1238   ALT 29 03/02/2016 1238   BILITOT <0.30 03/02/2016 1238      ASSESSMENT & PLAN:  MDS (myelodysplastic syndrome) with 5q deletion The patient had drastic change in his CBC with significant improvement of his platelet count, likely due to stress reaction He will continue current dose of Promacta. There is no contraindication for him to proceed with surgery if indicated or biopsy as long as blood count is  greater than 50,000 For anemia, he does not require blood transfusion.  Pleural effusion, left The cause of pleural effusion is unknown. The patient will continue close follow-up with CT surgery for management  Thyroid nodule The patient and family inquire about thyroid biopsy. As above, there is no contraindication for him to proceed with biopsy   Orders Placed This Encounter  Procedures  . Comprehensive metabolic panel    Standing Status:   Future    Standing Expiration Date:   04/06/2017  . CBC & Diff and Retic    Standing Status:   Future    Standing Expiration Date:   04/06/2017   All questions were answered. The patient knows to call the clinic with any problems, questions or concerns. No barriers to learning was detected. I spent 15 minutes counseling the patient face to face. The total time spent in the appointment was 20 minutes and more than 50% was on counseling and review of test results     [facility], [place], MD 03/03/2016 8:53 AM

## 2016-03-03 NOTE — Assessment & Plan Note (Signed)
The patient and family inquire about thyroid biopsy. As above, there is no contraindication for him to proceed with biopsy

## 2016-03-03 NOTE — Assessment & Plan Note (Signed)
The patient had drastic change in his CBC with significant improvement of his platelet count, likely due to stress reaction He will continue current dose of Promacta. There is no contraindication for him to proceed with surgery if indicated or biopsy as long as blood count is greater than 50,000 For anemia, he does not require blood transfusion.

## 2016-03-05 DIAGNOSIS — Z4682 Encounter for fitting and adjustment of non-vascular catheter: Secondary | ICD-10-CM | POA: Diagnosis not present

## 2016-03-05 DIAGNOSIS — D469 Myelodysplastic syndrome, unspecified: Secondary | ICD-10-CM | POA: Diagnosis not present

## 2016-03-05 DIAGNOSIS — J90 Pleural effusion, not elsewhere classified: Secondary | ICD-10-CM | POA: Diagnosis not present

## 2016-03-05 DIAGNOSIS — I10 Essential (primary) hypertension: Secondary | ICD-10-CM | POA: Diagnosis not present

## 2016-03-05 DIAGNOSIS — D696 Thrombocytopenia, unspecified: Secondary | ICD-10-CM | POA: Diagnosis not present

## 2016-03-05 DIAGNOSIS — E119 Type 2 diabetes mellitus without complications: Secondary | ICD-10-CM | POA: Diagnosis not present

## 2016-03-09 DIAGNOSIS — D696 Thrombocytopenia, unspecified: Secondary | ICD-10-CM | POA: Diagnosis not present

## 2016-03-09 DIAGNOSIS — Z4682 Encounter for fitting and adjustment of non-vascular catheter: Secondary | ICD-10-CM | POA: Diagnosis not present

## 2016-03-09 DIAGNOSIS — J90 Pleural effusion, not elsewhere classified: Secondary | ICD-10-CM | POA: Diagnosis not present

## 2016-03-09 DIAGNOSIS — D469 Myelodysplastic syndrome, unspecified: Secondary | ICD-10-CM | POA: Diagnosis not present

## 2016-03-09 DIAGNOSIS — I10 Essential (primary) hypertension: Secondary | ICD-10-CM | POA: Diagnosis not present

## 2016-03-09 DIAGNOSIS — E119 Type 2 diabetes mellitus without complications: Secondary | ICD-10-CM | POA: Diagnosis not present

## 2016-03-10 ENCOUNTER — Ambulatory Visit: Payer: Medicare Other | Admitting: Cardiothoracic Surgery

## 2016-03-11 ENCOUNTER — Other Ambulatory Visit: Payer: Self-pay | Admitting: Family Medicine

## 2016-03-11 DIAGNOSIS — E041 Nontoxic single thyroid nodule: Secondary | ICD-10-CM

## 2016-03-16 DIAGNOSIS — H6982 Other specified disorders of Eustachian tube, left ear: Secondary | ICD-10-CM | POA: Diagnosis not present

## 2016-03-16 DIAGNOSIS — H9012 Conductive hearing loss, unilateral, left ear, with unrestricted hearing on the contralateral side: Secondary | ICD-10-CM | POA: Diagnosis not present

## 2016-03-16 DIAGNOSIS — J343 Hypertrophy of nasal turbinates: Secondary | ICD-10-CM | POA: Diagnosis not present

## 2016-03-16 DIAGNOSIS — J310 Chronic rhinitis: Secondary | ICD-10-CM | POA: Diagnosis not present

## 2016-03-22 ENCOUNTER — Ambulatory Visit (INDEPENDENT_AMBULATORY_CARE_PROVIDER_SITE_OTHER): Payer: Medicare Other | Admitting: Family Medicine

## 2016-03-22 ENCOUNTER — Encounter: Payer: Self-pay | Admitting: Family Medicine

## 2016-03-22 VITALS — BP 128/70 | HR 76 | Temp 97.7°F | Resp 16 | Ht 69.0 in | Wt 185.0 lb

## 2016-03-22 DIAGNOSIS — I10 Essential (primary) hypertension: Secondary | ICD-10-CM | POA: Diagnosis not present

## 2016-03-22 DIAGNOSIS — N183 Chronic kidney disease, stage 3 unspecified: Secondary | ICD-10-CM

## 2016-03-22 DIAGNOSIS — I2510 Atherosclerotic heart disease of native coronary artery without angina pectoris: Secondary | ICD-10-CM

## 2016-03-22 DIAGNOSIS — J90 Pleural effusion, not elsewhere classified: Secondary | ICD-10-CM | POA: Diagnosis not present

## 2016-03-22 DIAGNOSIS — R079 Chest pain, unspecified: Secondary | ICD-10-CM | POA: Diagnosis not present

## 2016-03-22 DIAGNOSIS — D46C Myelodysplastic syndrome with isolated del(5q) chromosomal abnormality: Secondary | ICD-10-CM | POA: Diagnosis not present

## 2016-03-22 LAB — CBC WITH DIFFERENTIAL/PLATELET
Basophils Absolute: 0 cells/uL (ref 0–200)
Basophils Relative: 0 %
EOS PCT: 0 %
Eosinophils Absolute: 0 cells/uL — ABNORMAL LOW (ref 15–500)
HCT: 32 % — ABNORMAL LOW (ref 38.5–50.0)
Hemoglobin: 10.1 g/dL — ABNORMAL LOW (ref 13.0–17.0)
LYMPHS PCT: 31 %
Lymphs Abs: 1550 cells/uL (ref 850–3900)
MCH: 23.9 pg — AB (ref 27.0–33.0)
MCHC: 31.6 g/dL — AB (ref 32.0–36.0)
MCV: 75.7 fL — AB (ref 80.0–100.0)
MONOS PCT: 18 %
Monocytes Absolute: 900 cells/uL (ref 200–950)
Neutro Abs: 2550 cells/uL (ref 1500–7800)
Neutrophils Relative %: 51 %
PLATELETS: 33 10*3/uL — AB (ref 140–400)
RBC: 4.23 MIL/uL (ref 4.20–5.80)
RDW: 22.3 % — AB (ref 11.0–15.0)
WBC: 5 10*3/uL (ref 3.8–10.8)

## 2016-03-22 NOTE — Patient Instructions (Signed)
We will call with labs Call for any changes  F/U as needed

## 2016-03-22 NOTE — Progress Notes (Signed)
Subjective:    Patient ID: Ricardo [facility], male    DOB: September 21, 1930, 82 y.o.   MRN: [id number]  Patient presents for S/P Fall (pt had fall this morning- reports he had dizziness before he fell)  Patient here status post fall this morning. He is very complicated past medical history at his advanced age. He has history of non-Hodgkin's lymphoma now being treated for myelodysplastic syndrome he also has chronic left-sided pleural effusion which she has a Pleurx catheter in place. He is also treated for diabetes mellitus and hypertension.His home blood pressure readings have been 120s to 130s over 70s to 80s his blood sugars have been good 120s fasting per his wife.  With regards to his pleural effusion he has a Pleurx catheter which she gets drained 3 times a week. This morning 1000cc were drained off per report.  This morning around 4:00 in the morning he felt a little dizzy and he fell to his knees he did not injure himself. His wife checked all of his vitals they were stable he did complain of a little chest discomfort or shortness of breath. This was prior to the drawn off the fluid. Now he states that he feels well he was able to E lungs without any difficulty does not have any GI symptoms no current chest pain The fluid looks normal per report  Review Of Systems:  GEN- denies fatigue, fever, weight loss,weakness, recent illness HEENT- denies eye drainage, change in vision, nasal discharge, CVS-+chest pain, palpitations RESP- +SOB, cough, wheeze ABD- denies N/V, change in stools, abd pain GU- denies dysuria, hematuria, dribbling, incontinence MSK- denies joint pain, muscle aches, injury Neuro- denies headache, dizziness, syncope, seizure activity       Objective:    BP 128/70 (BP Location: Right Arm, Patient Position: Sitting, Cuff Size: Normal)   Pulse 76   Temp 97.7 F (36.5 C) (Oral)   Resp 16   Ht 5\' 9"  (1.753 m)   Wt 185 lb (83.9 kg)   SpO2 98% Comment: RA  BMI 27.32 kg/m   GEN- NAD, alert and oriented x3 HEENT- PERRL, EOMI, non injected sclera, pink conjunctiva, MMM, oropharynx clear Neck- Supple, no LAD CVS- RRR, 3/6 SEM RESP-Crackles bilat bases, normal WOB, fair air movement  ABD-NABS,soft,NT,ND Neuro-CNII-XII in tact  EXT- No edema Pulses- Radial, DP- 2+    EKG- NSR, PVC compared to previous 2017 EKG unchanged     Assessment & Plan:      Problem List Items Addressed This Visit    Pleural effusion, left    Recurrent effusion with increased output- will contact his CT surgeon, he may need to go back to TIW removal I think his SOB had to do with the increase in fluid now at baseline, sats are good, he looks well CBC at baseline, no sign of infection- hold off on CXR at this time      MDS (myelodysplastic syndrome) with 5q deletion ([facility])   Essential hypertension    No hypotension noted, continue current meds EKG reasuring with regards to chest pain       Chronic renal failure, stage 3 (moderate)   CAD (coronary artery disease) ([facility])   Relevant Orders   EKG 12-Lead (Completed)    Other Visit Diagnoses    Chest pain, unspecified type    -  Primary   history of CAD but EKG reassuring, does not sound like unstable angina at this time   Relevant Orders   Comprehensive metabolic panel  CBC with Differential/Platelet      Note: This dictation was prepared with Dragon dictation along with smaller phrase technology. Any transcriptional errors that result from this process are unintentional.

## 2016-03-22 NOTE — Assessment & Plan Note (Signed)
No hypotension noted, continue current meds EKG reasuring with regards to chest pain

## 2016-03-22 NOTE — Assessment & Plan Note (Signed)
Recurrent effusion with increased output- will contact his CT surgeon, he may need to go back to TIW removal I think his SOB had to do with the increase in fluid now at baseline, sats are good, he looks well CBC at baseline, no sign of infection

## 2016-03-23 ENCOUNTER — Telehealth: Payer: Self-pay | Admitting: *Deleted

## 2016-03-23 ENCOUNTER — Other Ambulatory Visit: Payer: Self-pay | Admitting: Hematology and Oncology

## 2016-03-23 ENCOUNTER — Other Ambulatory Visit: Payer: Medicare Other

## 2016-03-23 DIAGNOSIS — D696 Thrombocytopenia, unspecified: Secondary | ICD-10-CM

## 2016-03-23 LAB — COMPREHENSIVE METABOLIC PANEL
ALT: 9 U/L (ref 9–46)
AST: 11 U/L (ref 10–35)
Albumin: 2.8 g/dL — ABNORMAL LOW (ref 3.6–5.1)
Alkaline Phosphatase: 58 U/L (ref 40–115)
BUN: 18 mg/dL (ref 7–25)
CO2: 25 mmol/L (ref 20–31)
Calcium: 7.8 mg/dL — ABNORMAL LOW (ref 8.6–10.3)
Chloride: 103 mmol/L (ref 98–110)
Creat: 1.49 mg/dL — ABNORMAL HIGH (ref 0.70–1.11)
Glucose, Bld: 199 mg/dL — ABNORMAL HIGH (ref 70–99)
Potassium: 4 mmol/L (ref 3.5–5.3)
Sodium: 136 mmol/L (ref 135–146)
Total Bilirubin: 0.5 mg/dL (ref 0.2–1.2)
Total Protein: 6.5 g/dL (ref 6.1–8.1)

## 2016-03-23 NOTE — Telephone Encounter (Signed)
Left message on home phone. Want to see if patient can come on Friday, 03/26/16 @ 1200 for lab, 1230 Dr [name].  Requested call back to confirm

## 2016-03-23 NOTE — Telephone Encounter (Signed)
-----   Message from Heath Lark, MD sent at 03/23/2016  1:51 PM EDT ----- Can you call patient and see if family can bring him in for repeat labs and visit on Friday? I can see him at 1230 pm as an add on ----- Message ----- From: Alycia Rossetti, MD Sent: 03/23/2016   1:36 PM To: Heath Lark, MD  Patients Plts down to 33,000, however his anemia and his renal function has actually improved. By the time I evaluated him from his shortness of breath he was back to his baseline there is no sign of any active bleeding. He is still getting home pleurocentesis Any other recommendations size continue to monitor at this time

## 2016-03-24 NOTE — Telephone Encounter (Signed)
Spoke with wife. Pt will be here at 1200 for lab and 1230 Dr [name] on Friday

## 2016-03-26 ENCOUNTER — Telehealth: Payer: Self-pay

## 2016-03-26 ENCOUNTER — Other Ambulatory Visit (HOSPITAL_BASED_OUTPATIENT_CLINIC_OR_DEPARTMENT_OTHER): Payer: Medicare Other

## 2016-03-26 ENCOUNTER — Encounter: Payer: Self-pay | Admitting: Hematology and Oncology

## 2016-03-26 ENCOUNTER — Ambulatory Visit (HOSPITAL_BASED_OUTPATIENT_CLINIC_OR_DEPARTMENT_OTHER): Payer: Medicare Other | Admitting: Hematology and Oncology

## 2016-03-26 VITALS — BP 128/59 | HR 80 | Temp 97.7°F | Resp 18 | Wt 188.9 lb

## 2016-03-26 DIAGNOSIS — Z8572 Personal history of non-Hodgkin lymphomas: Secondary | ICD-10-CM | POA: Diagnosis not present

## 2016-03-26 DIAGNOSIS — J90 Pleural effusion, not elsewhere classified: Secondary | ICD-10-CM | POA: Diagnosis not present

## 2016-03-26 DIAGNOSIS — D46C Myelodysplastic syndrome with isolated del(5q) chromosomal abnormality: Secondary | ICD-10-CM

## 2016-03-26 DIAGNOSIS — E041 Nontoxic single thyroid nodule: Secondary | ICD-10-CM

## 2016-03-26 DIAGNOSIS — D696 Thrombocytopenia, unspecified: Secondary | ICD-10-CM

## 2016-03-26 DIAGNOSIS — Z8579 Personal history of other malignant neoplasms of lymphoid, hematopoietic and related tissues: Secondary | ICD-10-CM

## 2016-03-26 LAB — CBC & DIFF AND RETIC
BASO%: 0.2 % (ref 0.0–2.0)
Basophils Absolute: 0 10*3/uL (ref 0.0–0.1)
EOS ABS: 0 10*3/uL (ref 0.0–0.5)
EOS%: 0.7 % (ref 0.0–7.0)
HEMATOCRIT: 33.1 % — AB (ref 38.4–49.9)
HGB: 10 g/dL — ABNORMAL LOW (ref 13.0–17.1)
Immature Retic Fract: 16.6 % — ABNORMAL HIGH (ref 3.00–10.60)
LYMPH%: 37.7 % (ref 14.0–49.0)
MCH: 24 pg — AB (ref 27.2–33.4)
MCHC: 30.2 g/dL — AB (ref 32.0–36.0)
MCV: 79.6 fL (ref 79.3–98.0)
MONO#: 0.5 10*3/uL (ref 0.1–0.9)
MONO%: 12 % (ref 0.0–14.0)
NEUT#: 2.1 10*3/uL (ref 1.5–6.5)
NEUT%: 49.4 % (ref 39.0–75.0)
PLATELETS: 29 10*3/uL — AB (ref 140–400)
RBC: 4.16 10*6/uL — ABNORMAL LOW (ref 4.20–5.82)
RDW: 23.5 % — AB (ref 11.0–14.6)
RETIC %: 2.2 % — AB (ref 0.80–1.80)
Retic Ct Abs: 91.52 10*3/uL (ref 34.80–93.90)
WBC: 4.2 10*3/uL (ref 4.0–10.3)
lymph#: 1.6 10*3/uL (ref 0.9–3.3)
nRBC: 0 % (ref 0–0)

## 2016-03-26 LAB — IRON AND TIBC
%SAT: 27 % (ref 20–55)
Iron: 46 ug/dL (ref 42–163)
TIBC: 169 ug/dL — AB (ref 202–409)
UIBC: 123 ug/dL (ref 117–376)

## 2016-03-26 LAB — FERRITIN: Ferritin: 283 ng/ml (ref 22–316)

## 2016-03-26 LAB — TECHNOLOGIST REVIEW

## 2016-03-26 MED ORDER — ELTROMBOPAG OLAMINE 75 MG PO TABS: 75.0000 mg | ORAL_TABLET | Freq: Every day | ORAL | 9 refills | Status: DC

## 2016-03-26 NOTE — Telephone Encounter (Signed)
Appointments made and avs /calendar printed  [employer]

## 2016-03-26 NOTE — Assessment & Plan Note (Signed)
He has ultrasound evaluation pending. I do not recommend thyroid biopsy in the meantime

## 2016-03-26 NOTE — Assessment & Plan Note (Addendum)
The cause of pleural effusion is unknown. The patient will continue close follow-up with CT surgery for management I plan to order a PET CT scan to exclude recurrent lymphoma

## 2016-03-26 NOTE — Progress Notes (Signed)
[facility] OFFICE PROGRESS NOTE  Patient Care Team: [name], MD as PCP - General (Family Medicine) Heath Lark, MD as Consulting Physician (Hematology and Oncology) [name], MD as Consulting Physician (Cardiology)  SUMMARY OF ONCOLOGIC HISTORY: Oncology History   Calculated R-IPSS score at low risk     MDS (myelodysplastic syndrome) with 5q deletion ([place])   11/07/2014 Bone Marrow Biopsy    Accession: [id number] marrow biopsy showed myelodysplastic syndrome with 5q deletion.      04/02/2015 -  Chemotherapy    He started taking Promacta daily for thrombocytopenia       Summary of his history is as follows: He was initially diagnosed with follicular grade 2, B-cell, non-Hodgkin's lymphoma in August 2000 when he presented with bulky retroperitoneal and periaortic lymphadenopathy. He achieved a near complete response to 4 cycles of fludarabine, mitoxantrone, and dexamethasone. Due to significant myelosuppression, chemotherapy was stopped after 4 cycles. He was put on consolidation therapy with Rituxan weekly x4. All treatments were completed by 07/21/1999. He has had no signs of recurrent lymphoma since that time.  Over time he has developed other problems. He developed cancer of the prostate Gleason 3+3 right lobe, 3+ 4 left lobe, in May of 2007 treated with radioactive iodine seed implants.  Last PET/CT scan in May 2016 show no evidence of cancer recurrence  INTERVAL HISTORY: Please see below for problem oriented charting. He is seen urgently due to acute on chronic severe pancytopenia. The patient have recent pleural effusion with placement of drainage tube. This has to be drained fairly frequently at home. He denies fever or chills. The patient denies any recent signs or symptoms of bleeding such as spontaneous epistaxis, hematuria or hematochezia. He has lost some weight since I saw him  REVIEW OF SYSTEMS:   Constitutional: Denies fevers, chills   Eyes: Denies blurriness of vision Ears, nose, mouth, throat, and face: Denies mucositis or sore throat Cardiovascular: Denies palpitation, chest discomfort or lower extremity swelling Gastrointestinal:  Denies nausea, heartburn or change in bowel habits Skin: Denies abnormal skin rashes Lymphatics: Denies new lymphadenopathy Neurological:Denies numbness, tingling or new weaknesses Behavioral/Psych: Mood is stable, no new changes  All other systems were reviewed with the patient and are negative.  I have reviewed the past medical history, past surgical history, social history and family history with the patient and they are unchanged from previous note.  ALLERGIES:  is allergic to glucophage [metformin hydrochloride]; zetia [ezetimibe]; fenofibrate; and niacin-lovastatin er.  MEDICATIONS:  Current Outpatient Prescriptions  Medication Sig Dispense Refill  . acetaminophen (TYLENOL) 500 MG tablet Take 500-1,000 mg by mouth every 4 (four) hours as needed for mild pain or moderate pain.     Marland Kitchen clopidogrel (PLAVIX) 75 MG tablet TAKE 1 TABLET BY MOUTH DAILY WITH BREAKFAST. 90 tablet 4  . Cyanocobalamin (VITAMIN B-12 IJ) Inject 1 mL as directed every 30 (thirty) days.    . Dextromethorphan-Menthol (DELSYM COUGH RELIEF MT) Use as directed in the mouth or throat.    Marland Kitchen doxazosin (CARDURA) 4 MG tablet TAKE 1 TABLET BY MOUTH ONCE A DAY 30 tablet 3  . eltrombopag (PROMACTA) 75 MG tablet Take 1 tablet (75 mg total) by mouth daily. Take on an empty stomach 1 hour before a meal or 2 hours after 30 tablet 9  . ferrous sulfate 325 (65 FE) MG tablet Take 325 mg by mouth daily with breakfast.    . fluticasone (FLONASE) 50 MCG/ACT nasal spray   98  .  glucose blood test strip 1 each by Other route daily. Fasting each morning 100 each 3  . Insulin Glargine (LANTUS SOLOSTAR) 100 UNIT/ML Solostar Pen INJECT 15 UNITS INTO THE SKIN EACH MORNING    . meclizine (ANTIVERT) 12.5 MG tablet Take 1 tablet (12.5 mg total) by  mouth 3 (three) times daily as needed for dizziness. 30 tablet 0  . metoCLOPramide (REGLAN) 5 MG tablet Take 1 tablet (5 mg total) by mouth 3 (three) times daily before meals. (Patient taking differently: Take 5 mg by mouth 3 (three) times daily as needed. ) 90 tablet 0  . metoprolol succinate (TOPROL-XL) 100 MG 24 hr tablet TAKE 1 TABLET BY MOUTH ONCE A DAY 90 tablet 3  . Multiple Vitamins-Minerals (MULTIVITAMIN ADULT PO) Take 1 tablet by mouth daily.    . valsartan (DIOVAN) 160 MG tablet Take 1 tablet (160 mg total) by mouth daily. 30 tablet 11   Current Facility-Administered Medications  Medication Dose Route Frequency Provider Last Rate Last Dose  . cyanocobalamin ((VITAMIN B-12)) injection 1,000 mcg  1,000 mcg Intramuscular Q30 days [name], MD   1,000 mcg at 02/27/16 1515    PHYSICAL EXAMINATION: ECOG PERFORMANCE STATUS: 1 - Symptomatic but completely ambulatory  Vitals:   03/26/16 1211  BP: (!) 128/59  Pulse: 80  Resp: 18  Temp: 97.7 F (36.5 C)   Filed Weights   03/26/16 1211  Weight: 188 lb 14.4 oz (85.7 kg)    GENERAL:alert, no distress and comfortable SKIN: Noted extensive skin bruises EYES: normal, Conjunctiva are pink and non-injected, sclera clear OROPHARYNX:no exudate, no erythema and lips, buccal mucosa, and tongue normal  NECK: supple, thyroid normal size, non-tender, without nodularity LYMPH:  no palpable lymphadenopathy in the cervical, axillary or inguinal LUNGS: Reduced breath on the left lower base but with normal breathing effort HEART: regular rate & rhythm and no murmurs and no lower extremity edema ABDOMEN:abdomen soft, non-tender and normal bowel sounds Musculoskeletal:no cyanosis of digits and no clubbing  NEURO: alert & oriented x 3 with fluent speech, no focal motor/sensory deficits  LABORATORY DATA:  I have reviewed the data as listed    Component Value Date/Time   NA 136 03/22/2016 1458   NA 138 03/02/2016 1238   K 4.0 03/22/2016  1458   K 4.6 03/02/2016 1238   CL 103 03/22/2016 1458   CL 107 09/27/2012 0940   CO2 25 03/22/2016 1458   CO2 25 03/02/2016 1238   GLUCOSE 199 (H) 03/22/2016 1458   GLUCOSE 163 (H) 03/02/2016 1238   GLUCOSE 89 09/27/2012 0940   BUN 18 03/22/2016 1458   BUN 20.9 03/02/2016 1238   CREATININE 1.49 (H) 03/22/2016 1458   CREATININE 1.8 (H) 03/02/2016 1238   CALCIUM 7.8 (L) 03/22/2016 1458   CALCIUM 8.4 03/02/2016 1238   PROT 6.5 03/22/2016 1458   PROT 8.2 03/02/2016 1238   ALBUMIN 2.8 (L) 03/22/2016 1458   ALBUMIN 2.6 (L) 03/02/2016 1238   AST 11 03/22/2016 1458   AST 21 03/02/2016 1238   ALT 9 03/22/2016 1458   ALT 29 03/02/2016 1238   ALKPHOS 58 03/22/2016 1458   ALKPHOS 84 03/02/2016 1238   BILITOT 0.5 03/22/2016 1458   BILITOT <0.30 03/02/2016 1238   GFRNONAA 40 (L) 02/19/2016 0454   GFRNONAA 49 (L) 07/02/2015 0841   GFRAA 46 (L) 02/19/2016 0454   GFRAA 57 (L) 07/02/2015 0841    No results found for: SPEP, UPEP  Lab Results  Component Value Date  WBC 4.2 03/26/2016   NEUTROABS 2.1 03/26/2016   HGB 10.0 (L) 03/26/2016   HCT 33.1 (L) 03/26/2016   MCV 79.6 03/26/2016   PLT 29 (L) 03/26/2016      Chemistry      Component Value Date/Time   NA 136 03/22/2016 1458   NA 138 03/02/2016 1238   K 4.0 03/22/2016 1458   K 4.6 03/02/2016 1238   CL 103 03/22/2016 1458   CL 107 09/27/2012 0940   CO2 25 03/22/2016 1458   CO2 25 03/02/2016 1238   BUN 18 03/22/2016 1458   BUN 20.9 03/02/2016 1238   CREATININE 1.49 (H) 03/22/2016 1458   CREATININE 1.8 (H) 03/02/2016 1238      Component Value Date/Time   CALCIUM 7.8 (L) 03/22/2016 1458   CALCIUM 8.4 03/02/2016 1238   ALKPHOS 58 03/22/2016 1458   ALKPHOS 84 03/02/2016 1238   AST 11 03/22/2016 1458   AST 21 03/02/2016 1238   ALT 9 03/22/2016 1458   ALT 29 03/02/2016 1238   BILITOT 0.5 03/22/2016 1458   BILITOT <0.30 03/02/2016 1238      ASSESSMENT & PLAN:  MDS (myelodysplastic syndrome) with 5q deletion The  patient had drastic change in his CBC with significant acute worsening pancytopenia likely due to increased consumption from minor bleeding and stress reaction There is no contraindication for him to proceed with surgery if indicated or biopsy as long as blood count is greater than 50,000 He will need perioperative platelet transfusion For anemia, he does not require blood transfusion. Iron studies are pending I plan to increased Promacta to 11m and reassess in his next visit In the meantime, I recommend stopping Plavix  Pleural effusion, left The cause of pleural effusion is unknown. The patient will continue close follow-up with CT surgery for management I plan to order a PET CT scan to exclude recurrent lymphoma  History of lymphoma I am concerned about his recent night sweats, weight loss and persistent pleural effusion. Prior cytology from thoracentesis excluded malignancy but given his symptoms, I recommend restaging PET CT scan and he agreed to proceed  [place]rombocyt[place] His platelet count is severely reduced He does not need platelet transfusion today I recommend close monitoring. I have discontinue Plavix. I will resume Plavix if repeat platelet count is greater than 50,000 in the next visit I plan to increase Promacta to 75 mg as above. He will need prophylactic platelet transfusion for surgery or if he has signs of bleeding or platelet count less than 10,000   Thyroid nodule He has ultrasound evaluation pending. I do not recommend thyroid biopsy in the meantime   Orders Placed This Encounter  Procedures  . NM PET Image Restag (PS) Skull Base To Thigh    Standing Status:   Future    Standing Expiration Date:   04/30/2017    Order Specific Question:   Reason for exam:    Answer:   staging lymphoma, weight loss, pancytopenia, night sweats    Order Specific Question:   [facility]    [facility]  All questions were answered. The  patient knows to call the clinic with any problems, questions or concerns. No barriers to learning was detected. I spent 25 minutes counseling the patient face to face. The total time spent in the appointment was 40 minutes and more than 50% was on counseling and review of test results     NHeath Lark MD 03/26/2016 1:33 PM

## 2016-03-26 NOTE — Assessment & Plan Note (Signed)
His platelet count is severely reduced He does not need platelet transfusion today I recommend close monitoring. I have discontinue Plavix. I will resume Plavix if repeat platelet count is greater than 50,000 in the next visit I plan to increase Promacta to 75 mg as above. He will need prophylactic platelet transfusion for surgery or if he has signs of bleeding or platelet count less than 10,000

## 2016-03-26 NOTE — Assessment & Plan Note (Addendum)
The patient had drastic change in his CBC with significant acute worsening pancytopenia likely due to increased consumption from minor bleeding and stress reaction There is no contraindication for him to proceed with surgery if indicated or biopsy as long as blood count is greater than 50,000 He will need perioperative platelet transfusion For anemia, he does not require blood transfusion. Iron studies are pending I plan to increased Promacta to 75mg  and reassess in his next visit In the meantime, I recommend stopping Plavix

## 2016-03-26 NOTE — Assessment & Plan Note (Signed)
I am concerned about his recent night sweats, weight loss and persistent pleural effusion. Prior cytology from thoracentesis excluded malignancy but given his symptoms, I recommend restaging PET CT scan and he agreed to proceed

## 2016-03-27 LAB — VITAMIN B12: Vitamin B12: 713 pg/mL (ref 211–946)

## 2016-03-29 ENCOUNTER — Other Ambulatory Visit: Payer: Self-pay | Admitting: *Deleted

## 2016-03-29 MED ORDER — ELTROMBOPAG OLAMINE 75 MG PO TABS: 75.0000 mg | ORAL_TABLET | Freq: Every day | ORAL | 9 refills | Status: DC

## 2016-03-30 ENCOUNTER — Other Ambulatory Visit: Payer: Medicare Other

## 2016-03-30 DIAGNOSIS — J918 Pleural effusion in other conditions classified elsewhere: Secondary | ICD-10-CM | POA: Diagnosis not present

## 2016-03-31 ENCOUNTER — Ambulatory Visit (INDEPENDENT_AMBULATORY_CARE_PROVIDER_SITE_OTHER): Payer: Medicare Other | Admitting: Family Medicine

## 2016-03-31 DIAGNOSIS — E538 Deficiency of other specified B group vitamins: Secondary | ICD-10-CM

## 2016-03-31 MED ORDER — CYANOCOBALAMIN 1000 MCG/ML IJ SOLN
1000.0000 ug | Freq: Once | INTRAMUSCULAR | Status: AC
  Administered 2016-03-31: 1000 ug via INTRAMUSCULAR

## 2016-04-02 ENCOUNTER — Encounter
Admission: RE | Admit: 2016-04-02 | Discharge: 2016-04-02 | Disposition: A | Discharge status: Home / Self Care | Payer: Medicare Other | Source: Ambulatory Visit | Attending: Hematology and Oncology | Admitting: Hematology and Oncology

## 2016-04-02 DIAGNOSIS — Z8579 Personal history of other malignant neoplasms of lymphoid, hematopoietic and related tissues: Secondary | ICD-10-CM | POA: Diagnosis not present

## 2016-04-02 DIAGNOSIS — Z8572 Personal history of non-Hodgkin lymphomas: Secondary | ICD-10-CM

## 2016-04-02 DIAGNOSIS — C8590 Non-Hodgkin lymphoma, unspecified, unspecified site: Secondary | ICD-10-CM | POA: Diagnosis not present

## 2016-04-02 LAB — GLUCOSE, CAPILLARY: GLUCOSE-CAPILLARY: 119 mg/dL — AB (ref 65–99)

## 2016-04-02 MED ORDER — FLUDEOXYGLUCOSE F - 18 (FDG) INJECTION
12.3900 | Freq: Once | INTRAVENOUS | Status: AC
  Administered 2016-04-02: 12.39 via INTRAVENOUS

## 2016-04-03 ENCOUNTER — Encounter (HOSPITAL_COMMUNITY): Payer: Self-pay | Admitting: Nurse Practitioner

## 2016-04-03 ENCOUNTER — Emergency Department (HOSPITAL_COMMUNITY): Payer: Medicare Other

## 2016-04-03 ENCOUNTER — Inpatient Hospital Stay (HOSPITAL_COMMUNITY): Payer: Medicare Other

## 2016-04-03 ENCOUNTER — Inpatient Hospital Stay (HOSPITAL_COMMUNITY)
Admission: EM | Admit: 2016-04-03 | Discharge: 2016-04-20 | DRG: 871 | Disposition: A | Discharge status: Home Health | Payer: Medicare Other | Attending: Internal Medicine | Admitting: Internal Medicine

## 2016-04-03 DIAGNOSIS — D649 Anemia, unspecified: Secondary | ICD-10-CM | POA: Diagnosis present

## 2016-04-03 DIAGNOSIS — I739 Peripheral vascular disease, unspecified: Secondary | ICD-10-CM | POA: Diagnosis not present

## 2016-04-03 DIAGNOSIS — Z8546 Personal history of malignant neoplasm of prostate: Secondary | ICD-10-CM | POA: Diagnosis not present

## 2016-04-03 DIAGNOSIS — R079 Chest pain, unspecified: Secondary | ICD-10-CM

## 2016-04-03 DIAGNOSIS — Z7902 Long term (current) use of antithrombotics/antiplatelets: Secondary | ICD-10-CM

## 2016-04-03 DIAGNOSIS — A411 Sepsis due to other specified staphylococcus: Principal | ICD-10-CM | POA: Diagnosis present

## 2016-04-03 DIAGNOSIS — R109 Unspecified abdominal pain: Secondary | ICD-10-CM

## 2016-04-03 DIAGNOSIS — K5790 Diverticulosis of intestine, part unspecified, without perforation or abscess without bleeding: Secondary | ICD-10-CM | POA: Diagnosis not present

## 2016-04-03 DIAGNOSIS — Z9889 Other specified postprocedural states: Secondary | ICD-10-CM

## 2016-04-03 DIAGNOSIS — D61810 Antineoplastic chemotherapy induced pancytopenia: Secondary | ICD-10-CM | POA: Diagnosis present

## 2016-04-03 DIAGNOSIS — Z86718 Personal history of other venous thrombosis and embolism: Secondary | ICD-10-CM

## 2016-04-03 DIAGNOSIS — D509 Iron deficiency anemia, unspecified: Secondary | ICD-10-CM | POA: Diagnosis present

## 2016-04-03 DIAGNOSIS — J9601 Acute respiratory failure with hypoxia: Secondary | ICD-10-CM | POA: Diagnosis present

## 2016-04-03 DIAGNOSIS — Z79899 Other long term (current) drug therapy: Secondary | ICD-10-CM

## 2016-04-03 DIAGNOSIS — D638 Anemia in other chronic diseases classified elsewhere: Secondary | ICD-10-CM | POA: Diagnosis present

## 2016-04-03 DIAGNOSIS — J869 Pyothorax without fistula: Secondary | ICD-10-CM | POA: Diagnosis not present

## 2016-04-03 DIAGNOSIS — Z452 Encounter for adjustment and management of vascular access device: Secondary | ICD-10-CM | POA: Diagnosis not present

## 2016-04-03 DIAGNOSIS — D518 Other vitamin B12 deficiency anemias: Secondary | ICD-10-CM | POA: Diagnosis not present

## 2016-04-03 DIAGNOSIS — Z87891 Personal history of nicotine dependence: Secondary | ICD-10-CM | POA: Diagnosis not present

## 2016-04-03 DIAGNOSIS — E1122 Type 2 diabetes mellitus with diabetic chronic kidney disease: Secondary | ICD-10-CM | POA: Diagnosis present

## 2016-04-03 DIAGNOSIS — I10 Essential (primary) hypertension: Secondary | ICD-10-CM | POA: Diagnosis present

## 2016-04-03 DIAGNOSIS — D46C Myelodysplastic syndrome with isolated del(5q) chromosomal abnormality: Secondary | ICD-10-CM | POA: Diagnosis present

## 2016-04-03 DIAGNOSIS — I130 Hypertensive heart and chronic kidney disease with heart failure and stage 1 through stage 4 chronic kidney disease, or unspecified chronic kidney disease: Secondary | ICD-10-CM | POA: Diagnosis not present

## 2016-04-03 DIAGNOSIS — Z888 Allergy status to other drugs, medicaments and biological substances status: Secondary | ICD-10-CM

## 2016-04-03 DIAGNOSIS — T85618A Breakdown (mechanical) of other specified internal prosthetic devices, implants and grafts, initial encounter: Secondary | ICD-10-CM | POA: Diagnosis present

## 2016-04-03 DIAGNOSIS — C8590 Non-Hodgkin lymphoma, unspecified, unspecified site: Secondary | ICD-10-CM | POA: Diagnosis not present

## 2016-04-03 DIAGNOSIS — Z8579 Personal history of other malignant neoplasms of lymphoid, hematopoietic and related tissues: Secondary | ICD-10-CM | POA: Diagnosis not present

## 2016-04-03 DIAGNOSIS — Z459 Encounter for adjustment and management of unspecified implanted device: Secondary | ICD-10-CM | POA: Diagnosis not present

## 2016-04-03 DIAGNOSIS — D519 Vitamin B12 deficiency anemia, unspecified: Secondary | ICD-10-CM

## 2016-04-03 DIAGNOSIS — N183 Chronic kidney disease, stage 3 (moderate): Secondary | ICD-10-CM | POA: Diagnosis not present

## 2016-04-03 DIAGNOSIS — N179 Acute kidney failure, unspecified: Secondary | ICD-10-CM | POA: Diagnosis present

## 2016-04-03 DIAGNOSIS — E1151 Type 2 diabetes mellitus with diabetic peripheral angiopathy without gangrene: Secondary | ICD-10-CM | POA: Diagnosis present

## 2016-04-03 DIAGNOSIS — R0602 Shortness of breath: Secondary | ICD-10-CM | POA: Diagnosis not present

## 2016-04-03 DIAGNOSIS — J439 Emphysema, unspecified: Secondary | ICD-10-CM | POA: Diagnosis not present

## 2016-04-03 DIAGNOSIS — E876 Hypokalemia: Secondary | ICD-10-CM | POA: Diagnosis present

## 2016-04-03 DIAGNOSIS — E119 Type 2 diabetes mellitus without complications: Secondary | ICD-10-CM

## 2016-04-03 DIAGNOSIS — J9621 Acute and chronic respiratory failure with hypoxia: Secondary | ICD-10-CM

## 2016-04-03 DIAGNOSIS — R262 Difficulty in walking, not elsewhere classified: Secondary | ICD-10-CM

## 2016-04-03 DIAGNOSIS — T451X5A Adverse effect of antineoplastic and immunosuppressive drugs, initial encounter: Secondary | ICD-10-CM | POA: Diagnosis present

## 2016-04-03 DIAGNOSIS — Z8673 Personal history of transient ischemic attack (TIA), and cerebral infarction without residual deficits: Secondary | ICD-10-CM | POA: Diagnosis not present

## 2016-04-03 DIAGNOSIS — I2510 Atherosclerotic heart disease of native coronary artery without angina pectoris: Secondary | ICD-10-CM | POA: Diagnosis not present

## 2016-04-03 DIAGNOSIS — Z419 Encounter for procedure for purposes other than remedying health state, unspecified: Secondary | ICD-10-CM

## 2016-04-03 DIAGNOSIS — A419 Sepsis, unspecified organism: Secondary | ICD-10-CM | POA: Diagnosis not present

## 2016-04-03 DIAGNOSIS — J811 Chronic pulmonary edema: Secondary | ICD-10-CM

## 2016-04-03 DIAGNOSIS — Z09 Encounter for follow-up examination after completed treatment for conditions other than malignant neoplasm: Secondary | ICD-10-CM

## 2016-04-03 DIAGNOSIS — Z9689 Presence of other specified functional implants: Secondary | ICD-10-CM

## 2016-04-03 DIAGNOSIS — H269 Unspecified cataract: Secondary | ICD-10-CM | POA: Diagnosis present

## 2016-04-03 DIAGNOSIS — Z8249 Family history of ischemic heart disease and other diseases of the circulatory system: Secondary | ICD-10-CM

## 2016-04-03 DIAGNOSIS — D696 Thrombocytopenia, unspecified: Secondary | ICD-10-CM | POA: Diagnosis present

## 2016-04-03 DIAGNOSIS — Z4682 Encounter for fitting and adjustment of non-vascular catheter: Secondary | ICD-10-CM | POA: Diagnosis not present

## 2016-04-03 DIAGNOSIS — R52 Pain, unspecified: Secondary | ICD-10-CM

## 2016-04-03 DIAGNOSIS — J90 Pleural effusion, not elsewhere classified: Secondary | ICD-10-CM

## 2016-04-03 DIAGNOSIS — Z825 Family history of asthma and other chronic lower respiratory diseases: Secondary | ICD-10-CM

## 2016-04-03 DIAGNOSIS — E785 Hyperlipidemia, unspecified: Secondary | ICD-10-CM | POA: Diagnosis present

## 2016-04-03 DIAGNOSIS — I5031 Acute diastolic (congestive) heart failure: Secondary | ICD-10-CM | POA: Diagnosis present

## 2016-04-03 DIAGNOSIS — Z794 Long term (current) use of insulin: Secondary | ICD-10-CM

## 2016-04-03 DIAGNOSIS — I509 Heart failure, unspecified: Secondary | ICD-10-CM | POA: Diagnosis not present

## 2016-04-03 DIAGNOSIS — Y838 Other surgical procedures as the cause of abnormal reaction of the patient, or of later complication, without mention of misadventure at the time of the procedure: Secondary | ICD-10-CM | POA: Diagnosis present

## 2016-04-03 DIAGNOSIS — E559 Vitamin D deficiency, unspecified: Secondary | ICD-10-CM | POA: Diagnosis present

## 2016-04-03 LAB — CBC WITH DIFFERENTIAL/PLATELET
BASOS ABS: 0 10*3/uL (ref 0.0–0.1)
BASOS PCT: 0 %
Eosinophils Absolute: 0 10*3/uL (ref 0.0–0.7)
Eosinophils Relative: 0 %
HEMATOCRIT: 34.2 % — AB (ref 39.0–52.0)
HEMOGLOBIN: 10.5 g/dL — AB (ref 13.0–17.0)
LYMPHS ABS: 2.1 10*3/uL (ref 0.7–4.0)
LYMPHS PCT: 16 %
MCH: 24 pg — AB (ref 26.0–34.0)
MCHC: 30.7 g/dL (ref 30.0–36.0)
MCV: 78.1 fL (ref 78.0–100.0)
MONOS PCT: 17 %
Monocytes Absolute: 2.2 10*3/uL — ABNORMAL HIGH (ref 0.1–1.0)
NEUTROS ABS: 8.6 10*3/uL — AB (ref 1.7–7.7)
Neutrophils Relative %: 67 %
Platelets: 96 10*3/uL — ABNORMAL LOW (ref 150–400)
RBC: 4.38 MIL/uL (ref 4.22–5.81)
RDW: 23.3 % — AB (ref 11.5–15.5)
WBC: 12.9 10*3/uL — ABNORMAL HIGH (ref 4.0–10.5)

## 2016-04-03 LAB — URINE MICROSCOPIC-ADD ON

## 2016-04-03 LAB — URINALYSIS, ROUTINE W REFLEX MICROSCOPIC
BILIRUBIN URINE: NEGATIVE
GLUCOSE, UA: NEGATIVE mg/dL
Ketones, ur: NEGATIVE mg/dL
Leukocytes, UA: NEGATIVE
Nitrite: NEGATIVE
PROTEIN: 100 mg/dL — AB
SPECIFIC GRAVITY, URINE: 1.022 (ref 1.005–1.030)
pH: 5 (ref 5.0–8.0)

## 2016-04-03 LAB — COMPREHENSIVE METABOLIC PANEL
ALBUMIN: 2.6 g/dL — AB (ref 3.5–5.0)
ALT: 17 U/L (ref 17–63)
AST: 20 U/L (ref 15–41)
Alkaline Phosphatase: 75 U/L (ref 38–126)
Anion gap: 9 (ref 5–15)
BILIRUBIN TOTAL: 0.3 mg/dL (ref 0.3–1.2)
BUN: 11 mg/dL (ref 6–20)
CO2: 23 mmol/L (ref 22–32)
CREATININE: 1.6 mg/dL — AB (ref 0.61–1.24)
Calcium: 8.3 mg/dL — ABNORMAL LOW (ref 8.9–10.3)
Chloride: 100 mmol/L — ABNORMAL LOW (ref 101–111)
GFR calc Af Amer: 44 mL/min — ABNORMAL LOW (ref >60)
GFR, EST NON AFRICAN AMERICAN: 38 mL/min — AB (ref >60)
GLUCOSE: 199 mg/dL — AB (ref 65–99)
Potassium: 3.9 mmol/L (ref 3.5–5.1)
Sodium: 132 mmol/L — ABNORMAL LOW (ref 135–145)
TOTAL PROTEIN: 7.9 g/dL (ref 6.5–8.1)

## 2016-04-03 LAB — I-STAT CG4 LACTIC ACID, ED
LACTIC ACID, VENOUS: 1.88 mmol/L (ref 0.5–1.9)
Lactic Acid, Venous: 1.15 mmol/L (ref 0.5–1.9)

## 2016-04-03 LAB — PROTIME-INR
INR: 1.19
Prothrombin Time: 15.1 seconds (ref 11.4–15.2)

## 2016-04-03 LAB — APTT: aPTT: 35 seconds (ref 24–36)

## 2016-04-03 MED ORDER — PIPERACILLIN-TAZOBACTAM 3.375 G IVPB
3.3750 g | Freq: Three times a day (TID) | INTRAVENOUS | Status: DC
  Administered 2016-04-04 – 2016-04-16 (×37): 3.375 g via INTRAVENOUS
  Filled 2016-04-03 (×42): qty 50

## 2016-04-03 MED ORDER — PIPERACILLIN-TAZOBACTAM 3.375 G IVPB 30 MIN
3.3750 g | Freq: Once | INTRAVENOUS | Status: AC
  Administered 2016-04-03: 3.375 g via INTRAVENOUS
  Filled 2016-04-03: qty 50

## 2016-04-03 MED ORDER — SODIUM CHLORIDE 0.9 % IV BOLUS (SEPSIS)
2600.0000 mL | Freq: Once | INTRAVENOUS | Status: AC
  Administered 2016-04-03: 2600 mL via INTRAVENOUS

## 2016-04-03 MED ORDER — IOPAMIDOL (ISOVUE-300) INJECTION 61%
INTRAVENOUS | Status: AC
  Administered 2016-04-03: 75 mL
  Filled 2016-04-03: qty 75

## 2016-04-03 MED ORDER — ACETAMINOPHEN 325 MG PO TABS
650.0000 mg | ORAL_TABLET | Freq: Once | ORAL | Status: AC
  Administered 2016-04-03: 650 mg via ORAL
  Filled 2016-04-03: qty 2

## 2016-04-03 MED ORDER — VANCOMYCIN HCL 10 G IV SOLR
1250.0000 mg | INTRAVENOUS | Status: DC
  Administered 2016-04-04 – 2016-04-10 (×7): 1250 mg via INTRAVENOUS
  Filled 2016-04-03 (×10): qty 1250

## 2016-04-03 MED ORDER — VANCOMYCIN HCL IN DEXTROSE 1-5 GM/200ML-% IV SOLN
1000.0000 mg | Freq: Once | INTRAVENOUS | Status: AC
  Administered 2016-04-03: 1000 mg via INTRAVENOUS
  Filled 2016-04-03: qty 200

## 2016-04-03 NOTE — ED Triage Notes (Signed)
Pt presents with chief complaint of fever. He reports 1 day history of fevers, malaise, headaches, body aches, nausea, diarrhea. He tried tylenol and cough medicine this morning with no relief. He has a pulmonary catheter in place to drain fluid from his L lung, over the past few days the family has noticed no fluid draining at all. He was recently hospitalized for ear infection. He has been using nasal spray for past month to help drain fluid from inner ear but told he may need surgery if that did not work. He has had low platelet count that oncology has been following, had PET scan yesterday with no results yet. He is alert and breathing easily

## 2016-04-03 NOTE — Progress Notes (Signed)
Pharmacy Antibiotic Note  Ricardo Watkins is a 82 y.o. male admitted on 04/03/2016 with sepsis.  Pharmacy has been consulted for Vancomycin and Zosyn dosing. Note he has chronic kidney disease.  His serum creatinine today appears to be at his baseline. First doses of Vancomycin and Zosyn were ordered in the emergency department.  Plan: Vancomycin 1250mg  IV q24h - first dose 10/15  Zosyn 3.375g IV q8h extended infusion Monitor renal function  Follow available micro data    Temp (24hrs), Avg:101.3 F (38.5 C), Min:100.5 F (38.1 C), Max:102.3 F (39.1 C)   Recent Labs Lab 04/03/16 1505 04/03/16 1525  WBC 12.9*  --   CREATININE 1.60*  --   LATICACIDVEN  --  1.88    Estimated Creatinine Clearance: 38 mL/min (by C-G formula based on SCr of 1.6 mg/dL (H)).    Allergies  Allergen Reactions  . Glucophage [Metformin Hydrochloride] Other (See Comments)    Chest pain  . Zetia [Ezetimibe] Other (See Comments)    weakness  . Fenofibrate Rash  . Niacin-Lovastatin Er Rash    Antimicrobials this admission:  Vanc 10/14 >>  Zosyn 10/14 >>   Microbiology results:  10/14 BCx:  10/14 UCx:   Thank you for allowing pharmacy to be a part of this patient's care.  Legrand Como, Pharm.D., BCPS, AAHIVP Clinical Pharmacist Phone: [phone] or [phone] 04/03/2016, 7:25 PM

## 2016-04-03 NOTE — ED Notes (Signed)
Xray called to put patient in transport for studies

## 2016-04-03 NOTE — ED Notes (Signed)
Unsuccessful attempt at IV access. 

## 2016-04-03 NOTE — ED Notes (Signed)
Patient transported to X-ray 

## 2016-04-03 NOTE — H&P (Signed)
History and Physical    BECKETT Watkins OFB:[id number] DOB: 11-25-1930 DOA: 04/03/2016  PCP: [name], MD   Patient coming from: Home.  Chief Complaint: Fever  HPI: Ricardo Watkins is a 82 y.o. male with medical history significant of non-Hodgkin's lymphoma, CAD, type 2 diabetes, hyperlipidemia, hypertension, anemia, prostate CA, diverticulosis, cataracts, DVT, chronic cough, chronic left pleural effusion with insertion of left pleural catheter on 11/10/2015 by Dr. Nils Pyle who comes to the hospital due to fever, chills, worsening fatigue and malaise.  Per patient, his daughter and wife, his chest tube drainage has been decreasing his output for the past several days and he stopped completely yesterday. He normally has up to 500 mL of drainage daily. Then this morning, patient have fever, chills, fatigue and malaise. He  ED Course: The patient received a acetaminophen 650 mg, NS IVF bolus of 2600 mL, Zosyn and vancomycin. Shows a white count of 12.9, hemoglobin level of 10.5 g/dL, platelets are 96, sodium 132 mmol/L, creatinine 1.6 and glucose 199 mg/dL.   CT chest with contrast showed amoderate loculated left pleural effusion, with left pleural catheter tip in the posterior inferior left pleural space. Associated left lower lung atelectasis. Mediastinal lymph nodes as described, mildly hypermetabolic on recent PET study suspicious for recurrent lymphoma.  Review of Systems: As per HPI otherwise 10 point review of systems negative.    Past Medical History:  Diagnosis Date  . Adenomatous colon polyp   . CAD (coronary artery disease)    30% LAD Stenosis, 70% ramus intermedius stenosis, treated with PTCA and angioplasty by Dr Albertine Patricia 2004  . Cataract    right eye  . Cough, persistent 11/04/2015  . Deficiency anemia 04/19/2014  . Diverticulosis   . DM (diabetes mellitus) ([place])   . DVT (deep venous thrombosis) ([place])    secondary to surgery  . Dyslipidemia   . Headache    occ    . Hyperlipidemia   . Hypertension   . Inguinal hernia    right  . 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 Microcytic anemia 07/07/2015  . 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 Non Hodgkin's lymphoma ([place])   . Pleural effusion, left 11/04/2015  . Pneumonia   . Prostate CA ([place]) 09/10/2011   Gleason 3+3 R, 3+4 L lobe May 2007 Rx Radioactive seed implants Dr [name]  . Prostate cancer ([place])    seed implants no recurrence  . PVD (peripheral vascular disease) ([place])    rt renal artery stent  . Renal insufficiency   . Shortness of breath dyspnea   . Thrombocytopenia ([place])   . Thrombotic stroke ([place]) 09/10/2011   January 18, 2011 infarct genu & post limb R internal capsule - acute; previous lacunar infarcts/extensive white matter dis  . Vitamin D deficiency     Past Surgical History:  Procedure Laterality Date  . APPENDECTOMY     patient ?  Marland Kitchen CARDIAC CATHETERIZATION    . CHEST TUBE INSERTION Left 02/10/2016   Procedure: INSERTION PLEURAL DRAINAGE CATHETER;  Surgeon: [name], MD;  Location: [facility];  Service: Thoracic;  Laterality: Left;  . COLONOSCOPY    . CORONARY ANGIOPLASTY    . CYSTOURETHROSCOPY     ROBOTIC ARM NUCLETRON SEED IMPLANTATION OF PROSTATE  . EXPLORATORY LAPAROTOMY     For evaluation of lymphoma     reports that he quit smoking about 57 years ago. His smoking use included Pipe  and Cigarettes. He has a 10.00 pack-year smoking history. He has never used smokeless tobacco. He reports that he does not drink alcohol or use drugs.  Allergies  Allergen Reactions  . Glucophage [Metformin Hydrochloride] Other (See Comments)    Chest pain  . Zetia [Ezetimibe] Other (See Comments)    weakness  . Fenofibrate Rash  . Niacin-Lovastatin Er Rash    Family History  Problem Relation Age of Onset  . Lymphoma Sister   . Prostate cancer Brother   . Breast cancer Sister   . Diabetes Brother   . Diabetes Sister   . Heart disease Brother    . Asthma Son     Prior to Admission medications   Medication Sig Start Date End Date Taking? Authorizing Provider  acetaminophen (TYLENOL) 500 MG tablet Take 1,000 mg by mouth every 6 (six) hours as needed for mild pain, moderate pain or headache.    Yes Historical Provider, MD  cyanocobalamin (,VITAMIN B-12,) 1000 MCG/ML injection Inject 1,000 mcg into the muscle every 30 (thirty) days. Last injection 03/31/16   Yes Historical Provider, MD  Dextromethorphan Polistirex (DELSYM PO) Take 15 mLs by mouth at bedtime.   Yes Historical Provider, MD  doxazosin (CARDURA) 4 MG tablet TAKE 1 TABLET BY MOUTH ONCE A DAY Patient taking differently: TAKE 1 TABLET BY MOUTH DAILY AT BEDTIME 02/09/16  Yes [name], MD  eltrombopag (PROMACTA) 75 MG tablet Take 1 tablet (75 mg total) by mouth daily. Take on an empty stomach 1 hour before a meal or 2 hours after Patient taking differently: Take 75 mg by mouth daily before lunch. Take on an empty stomach 1 hour before a meal or 2 hours after 03/29/16  Yes Heath Lark, MD  ferrous sulfate 325 (65 FE) MG tablet Take 325 mg by mouth daily after supper.    Yes Historical Provider, MD  fluticasone (FLONASE) 50 MCG/ACT nasal spray Place 1 spray into both nostrils at bedtime.  03/16/16  Yes Historical Provider, MD  hydrocortisone cream 1 % Apply 1 application topically at bedtime as needed for itching.   Yes Historical Provider, MD  Insulin Glargine (LANTUS SOLOSTAR) 100 UNIT/ML Solostar Pen INJECT 15 UNITS INTO THE SKIN EACH MORNING Patient taking differently: Inject 15 Units into the skin daily after breakfast. INJECT 15 UNITS INTO THE SKIN EACH MO 02/21/16  Yes Shanker Kristeen Mans, MD  meclizine (ANTIVERT) 12.5 MG tablet Take 1 tablet (12.5 mg total) by mouth 3 (three) times daily as needed for dizziness. 06/27/15  Yes [name], MD  metoCLOPramide (REGLAN) 5 MG tablet Take 1 tablet (5 mg total) by mouth 3 (three) times daily before meals. Patient taking  differently: Take 5 mg by mouth 3 (three) times daily as needed for nausea or vomiting.  03/25/15  Yes [name], MD  metoprolol succinate (TOPROL-XL) 100 MG 24 hr tablet TAKE 1 TABLET BY MOUTH ONCE A DAY 12/11/15  Yes [name], MD  Multiple Vitamin (MULTIVITAMIN WITH MINERALS) TABS tablet Take 1 tablet by mouth at bedtime.   Yes Historical Provider, MD  OVER THE COUNTER MEDICATION Place 1 drop into both eyes daily as needed (dry eyes). Over the counter lubricating eye drop   Yes Historical Provider, MD  valsartan (DIOVAN) 160 MG tablet Take 1 tablet (160 mg total) by mouth daily. 11/14/15  Yes Tanda Rockers, MD  clopidogrel (PLAVIX) 75 MG tablet TAKE 1 TABLET BY MOUTH DAILY WITH BREAKFAST. Patient taking differently: Take 75 mg  by mouth daily with breakfast.  09/01/15   [name], MD  glucose blood test strip 1 each by Other route daily. Fasting each morning 10/21/15   [name], MD    Physical Exam:  Constitutional: NAD, calm, comfortable Vitals:   04/03/16 2046 04/03/16 2048 04/03/16 2100 04/03/16 2200  BP: 132/67  133/79 127/64  Pulse: 91  97 92  Resp: 24  24 23   Temp:  98.6 F (37 C)    TempSrc:  Oral    SpO2: 93%  90% 92%  Weight:      Height:       Eyes: PERRL, lids and conjunctivae normal ENMT: Mucous membranes are moist. Posterior pharynx clear of any exudate or lesions. Dentition showing extensive signs of decay.  Neck: normal, supple, no masses, no thyromegaly. Respiratory: Decreased breath sounds on left base,  no rales/rhonchi/wheezing, normal respiratory effort, no accessory muscle use. Chest: Left chest pleural catheter and tubing in place.  Cardiovascular: Regular rate and rhythm, 2/6 SEM, no rubs / gallops. No extremity edema. 2+ pedal pulses. No carotid bruits.  Abdomen: no tenderness, no masses palpated. No hepatosplenomegaly. Bowel sounds positive.  Musculoskeletal: no clubbing / cyanosis. No joint deformity upper and lower extremities. Good  ROM, no contractures. Normal muscle tone.  Skin: Small areas ecchymosis on extremities. Neurologic: CN 2-12 grossly intact. Sensation intact, DTR normal. Strength 5/5 in all 4.  Psychiatric: Normal judgment and insight. Alert and oriented x 3. Normal mood.    Labs on Admission: I have personally reviewed following labs and imaging studies  CBC:  Recent Labs Lab 04/03/16 1505  WBC 12.9*  NEUTROABS 8.6*  HGB 10.5*  HCT 34.2*  MCV 78.1  PLT 96*   Basic Metabolic Panel:  Recent Labs Lab 04/03/16 1505  NA 132*  K 3.9  CL 100*  CO2 23  GLUCOSE 199*  BUN 11  CREATININE 1.60*  CALCIUM 8.3*   GFR: Estimated Creatinine Clearance: 36.5 mL/min (by C-G formula based on SCr of 1.6 mg/dL (H)). Liver Function Tests:  Recent Labs Lab 04/03/16 1505  AST 20  ALT 17  ALKPHOS 75  BILITOT 0.3  PROT 7.9  ALBUMIN 2.6*   No results for input(s): LIPASE, AMYLASE in the last 168 hours. No results for input(s): AMMONIA in the last 168 hours. Coagulation Profile:  Recent Labs Lab 04/03/16 1910  INR 1.19   Cardiac Enzymes: No results for input(s): CKTOTAL, CKMB, CKMBINDEX, TROPONINI in the last 168 hours. BNP (last 3 results)  Recent Labs  01/27/16 1119  PROBNP 311.0*   HbA1C: No results for input(s): HGBA1C in the last 72 hours. CBG:  Recent Labs Lab 04/02/16 1127  GLUCAP 119*   Lipid Profile: No results for input(s): CHOL, HDL, LDLCALC, TRIG, CHOLHDL, LDLDIRECT in the last 72 hours. Thyroid Function Tests: No results for input(s): TSH, T4TOTAL, FREET4, T3FREE, THYROIDAB in the last 72 hours. Anemia Panel: No results for input(s): VITAMINB12, FOLATE, FERRITIN, TIBC, IRON, RETICCTPCT in the last 72 hours. Urine analysis:    Component Value Date/Time   COLORURINE AMBER (A) 04/03/2016 2007   APPEARANCEUR CLOUDY (A) 04/03/2016 2007   LABSPEC 1.022 04/03/2016 2007   PHURINE 5.0 04/03/2016 2007   GLUCOSEU NEGATIVE 04/03/2016 2007   HGBUR SMALL (A) 04/03/2016 2007    BILIRUBINUR NEGATIVE 04/03/2016 2007   [place] NEGATIVE 04/03/2016 2007   PROTEINUR 100 (A) 04/03/2016 2007   NITRITE NEGATIVE 04/03/2016 2007   LEUKOCYTESUR NEGATIVE 04/03/2016 2007     Recent  Results (from the past 240 hour(s))  TECHNOLOGIST REVIEW     Status: None   Collection Time: 03/26/16 12:02 PM  Result Value Ref Range Status   Technologist Review few ovalos & shistocytes. few large & giant plts.  Final     Radiological Exams on Admission: Dg Chest 2 View  Result Date: 04/03/2016 CLINICAL DATA:  Sepsis.  Concern for empyema EXAM: CHEST  2 VIEW COMPARISON:  03/03/2016 FINDINGS: Loculated left pleural effusion, moderate size, thickest at the base. There is a tunneled pleural catheter at the left base with alignment that is stable from 04/02/2016. The underlying lung is partly atelectatic. Normal heart size. The lower aorta is obscured by a medial pleural disease. IMPRESSION: Moderate loculated left pleural effusion with tunneled pleural catheter. Appearance is stable compared to 04/02/2016 PET-CT. Electronically Signed   By: Monte Fantasia M.D.   On: 04/03/2016 20:58   Ct Chest W Contrast  Result Date: 04/03/2016 CLINICAL DATA:  82 year old male with fever. Patient with left pleural drainage catheter. History of lymphoma. EXAM: CT CHEST WITH CONTRAST TECHNIQUE: Multidetector CT imaging of the chest was performed during intravenous contrast administration. CONTRAST:  41mL ISOVUE-300 IOPAMIDOL (ISOVUE-300) INJECTION 61% COMPARISON:  04/02/2016 PET-CT and prior studies. FINDINGS: Cardiovascular: Upper limits normal heart size and moderate coronary artery calcifications noted. There is no evidence of thoracic aortic aneurysm. Mediastinum/Nodes: Multiple shotty and enlarged mediastinal lymph nodes are identified, the largest in the lower thoracic para-aortic region. These mediastinal lymph nodes for mildly hypermetabolic on recent PET study suspicious for recurrent lymphoma. There is  no evidence of pericardial effusion. Lungs/Pleura: A moderate loculated left pleural effusion is again identified. A left pleural catheter with tip in the posterior-inferior left pleural space again noted. Left lower lung atelectasis identified. Mild interlobular septal thickening is noted within both lungs. No discrete mass identified. There is no evidence of pneumothorax or right pleural effusion. Upper Abdomen: No acute abnormality. Upper limits normal spleen size noted. Musculoskeletal: No acute or suspicious abnormality. Degenerative disc disease in the lower cervical spine identified. IMPRESSION: Moderate loculated left pleural effusion, with left pleural catheter tip in the posterior inferior left pleural space. Associated left lower lung atelectasis. Mediastinal lymph nodes as described, mildly hypermetabolic on recent PET study suspicious for recurrent lymphoma. Coronary artery disease. Electronically Signed   By: Margarette Canada M.D.   On: 04/03/2016 21:47   Nm Pet Image Restag (ps) Skull Base To Thigh  Result Date: 04/02/2016 CLINICAL DATA:  Subsequent treatment strategy for lymphoma. EXAM: NUCLEAR MEDICINE PET SKULL BASE TO THIGH TECHNIQUE: 12.4 mCi F-18 FDG was injected intravenously. Full-ring PET imaging was performed from the skull base to thigh after the radiotracer. CT data was obtained and used for attenuation correction and anatomic localization. FASTING BLOOD GLUCOSE:  Value: 119 mg/dl COMPARISON:  10/30/2014 FINDINGS: NECK No hypermetabolic lymph nodes in the neck. CHEST There is a moderate-sized left loculated pleural effusion. No hypermetabolism to suggest a malignant effusion. It measures as simple fluid. New line small nodules are noted along the left major fissure. These are likely lymph nodes. Is also 3 subpleural nodules in the left upper on image number 104. No obvious hypermetabolism. SUV max is 1.7. These are suspicious for lymphomatous nodules however and they are new since the  prior study. Enlarging mediastinal lymph nodes are mildly hypermetabolic and suspicious for recurrent lymphoma. 5 mm pretracheal lymph node on image number 87 has an SUV max of 2.56. Multiple small prevascular lymph nodes. The largest node measures  5 mm on image number 100 and SUV max is 2.46. 11 mm aorticopulmonary window lymph node on image number 94 has an SUV max of 3.5. Subcarinal lymph node on image number 109 measures 10 mm and has an SUV max of 3.5. Left-sided retrocrural lymph node measures 23 x 16 mm on image number 127 and has SUV max of 5.9. Right-sided periaortic lymph node on image number 121 measures 8.5 mm and SUV max is 4.1. ABDOMEN/PELVIS No abnormal hypermetabolic activity within the liver, pancreas, adrenal glands, or spleen. No hypermetabolic lymph nodes in the abdomen or pelvis. Stable advanced atherosclerotic calcifications involving the aorta and branch vessels. The right renal artery stent is noted. Brachytherapy seeds are noted the prostate gland. There is a large right inguinal hernia containing fat. SKELETON No focal hypermetabolic activity to suggest skeletal metastasis. IMPRESSION: 1. Enlarging hypermetabolic mediastinal lymph nodes suggesting recurrent lymphoma. 2. New nodules along the left major fissure and in the left upper lobe subpleural space worrisome for lymphomatous involvement. 3. Moderate-sized loculated left pleural effusion collection with a PleurX drainage catheter in place. 4. No findings for adenopathy in the neck, abdomen or pelvis. Electronically Signed   By: Marijo Sanes M.D.   On: 04/02/2016 14:52    Assessment/Plan Principal Problem:   Empyema ([place]) Admit to telemetry/inpatient. Continue supplemental oxygen. Analgesics as needed. Continue vancomycin per pharmacy. Continue Zosyn per pharmacy. Cardiothoracic surgery will evaluate in a.m.  Active Problems:   Type 2 diabetes mellitus ([facility]) Last hemoglobin A1c was 6.7% in July. Continue lantus 15 units  SQ daily. CBG monitoring with regular insulin sliding scale.    CAD (coronary artery disease) Continue metoprolol 100 mg by mouth daily. Clopidogrel was helped by hematology oncology.    History of lymphoma PET scan done yesterday suspicious for relapse of lymphoma. Continue scheduled follow-ups with hematology oncology after empyema treatment.    Essential hypertension Continue metoprolol 100 mg by mouth daily. Continue valsartan 160 mg by mouth daily. Monitor blood pressure, BUN/creatinine and electrolytes.    Thrombocytopenia ([place]) SCDs for DVT prophylaxis. Continue Promacta. Follow-up with hematology as scheduled. Monitor platelet levels.    Microcytic anemia Monitor hematocrit and hemoglobin.      DVT prophylaxis: SCDs Code Status: Full code. Family Communication: His daughter and wife were present in the ED room. Disposition Plan: Admit for IV antibiotic therapy for several days and cardiothoracic surgery evaluation in a.m. Consults called: Cardiothoracic surgery (). Admission status: [place]/Inpatient.   Reubin Milan MD [facility] Pager [phone].  If 7PM-7AM, please contact night-coverage [email or web address] Password [email or web address]  04/03/2016, 11:11 PM    Note: This document was prepared using Dragon voice recognition software and may include unintentional dictation errors.

## 2016-04-03 NOTE — ED Provider Notes (Signed)
[name] DEPT Provider Note  CSN: [id number] Arrival Date & Time: 04/03/16 @ 1440  History    Chief Complaint Chief Complaint  Patient presents with  . Fever    HPI Ricardo Watkins is a 82 y.o. male. Patient presents for fevers and chills for 1 day. Patient normally has 200 to 500 cc drainage from the left pleural catheter however Friday the drainage stopped completely. Patient denies CP or SOB. Per family no AMS. Patient denies HA or neck pain.  Has previous diagnosis Non hodgkin's Lymphoma and had PET scan yesterday to review progression.  Last admitted to the hospital 8/29 to 9/2 for fever. Patient is taking Promacta for low platelets per the patient and family.  Surgery performed by Dr. Tharon Aquas Trigt on 02/10/16 and was an insertion of a left pleural catheter.  Past Medical & Surgical History    Past Medical History:  Diagnosis Date  . Adenomatous colon polyp   . CAD (coronary artery disease)    30% LAD Stenosis, 70% ramus intermedius stenosis, treated with PTCA and angioplasty by Dr Albertine Patricia 2004  . Cataract    right eye  . Cough, persistent 11/04/2015  . Deficiency anemia 04/19/2014  . Diverticulosis   . DM (diabetes mellitus) ([place])   . DVT (deep venous thrombosis) ([place])    secondary to surgery  . Dyslipidemia   . Headache    occ  . Hyperlipidemia   . Hypertension   . Inguinal hernia    right  . 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 Microcytic anemia 07/07/2015  . 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 Non Hodgkin's lymphoma ([place])   . Pleural effusion, left 11/04/2015  . Pneumonia   . Prostate CA ([place]) 09/10/2011   Gleason 3+3 R, 3+4 L lobe May 2007 Rx Radioactive seed implants Dr [name]  . Prostate cancer ([place])    seed implants no recurrence  . PVD (peripheral vascular disease) ([place])    rt renal artery stent  . Renal insufficiency   . Shortness of breath dyspnea   . Thrombocytopenia ([place])   . 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 Vitamin D deficiency    Patient Active Problem List   Diagnosis Date Noted  . Type 2 diabetes mellitus ([place]) 04/04/2016  . Empyema ([place]) 04/03/2016  . Thyroid nodule 03/03/2016  . Sepsis ([place]) 02/17/2016  . Dyspnea 01/27/2016  . Pulmonary edema with congestive heart failure ([place]) 11/12/2015  . Cough, persistent 11/04/2015  . Pleural effusion, left 11/04/2015  . Anorexia 08/08/2015  . Microcytic anemia 07/07/2015  . Pancytopenia ([place]) 04/08/2015  . Diarrhea 04/08/2015  . Encounter for chemotherapy management 04/02/2015  . Neutropenia ([place]) 02/18/2015  . 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 Deficiency anemia 04/19/2014  . Thrombocytopenia ([place]) 04/19/2014  . Vitamin B12 deficiency 04/19/2014  . Chronic renal failure, stage 3 (moderate) 04/19/2014  . Macrocytic anemia 03/20/2013  . Monocytosis 03/20/2013  . Thrombotic stroke ([place]) 09/10/2011  . Prostate CA ([place]) 09/10/2011  . Inguinal hernia   . Colon polyps   . CAD (coronary artery disease) 08/26/2010  . HTN (hypertension) 08/26/2010  . Murmur 08/26/2010  . MURMUR 08/26/2010  . History of lymphoma 08/25/2010  . DM 08/25/2010  . DYSLIPIDEMIA 08/25/2010  . THROMBOCYTOPENIA 08/25/2010  . Essential hypertension 08/25/2010  .  Coronary atherosclerosis 08/25/2010  . PVD 08/25/2010   Past Surgical History:  Procedure Laterality Date  . APPENDECTOMY     patient ?  Marland Kitchen CARDIAC CATHETERIZATION    . CHEST TUBE INSERTION Left 02/10/2016   Procedure: INSERTION PLEURAL DRAINAGE CATHETER;  Surgeon: [name], MD;  Location: [facility];  Service: Thoracic;  Laterality: Left;  . COLONOSCOPY    . CORONARY ANGIOPLASTY    . CYSTOURETHROSCOPY     ROBOTIC ARM NUCLETRON SEED IMPLANTATION OF PROSTATE  . EXPLORATORY LAPAROTOMY     For evaluation of lymphoma    Family &  Social History    Family History  Problem Relation Age of Onset  . Lymphoma Sister   . Prostate cancer Brother   . Breast cancer Sister   . Diabetes Brother   . Diabetes Sister   . Heart disease Brother   . Asthma Son    Social History  Substance Use Topics  . Smoking status: Former Smoker    Packs/day: 0.50    Years: 20.00    Types: Pipe, Cigarettes    Quit date: 06/21/1958  . Smokeless tobacco: Never Used  . Alcohol use No    Home Medications    Prior to Admission medications   Medication Sig Start Date End Date Taking? Authorizing Provider  acetaminophen (TYLENOL) 500 MG tablet Take 1,000 mg by mouth every 6 (six) hours as needed for mild pain, moderate pain or headache.    Yes Historical Provider, MD  cyanocobalamin (,VITAMIN B-12,) 1000 MCG/ML injection Inject 1,000 mcg into the muscle every 30 (thirty) days. Last injection 03/31/16   Yes Historical Provider, MD  Dextromethorphan Polistirex (DELSYM PO) Take 15 mLs by mouth at bedtime.   Yes Historical Provider, MD  doxazosin (CARDURA) 4 MG tablet TAKE 1 TABLET BY MOUTH ONCE A DAY Patient taking differently: TAKE 1 TABLET BY MOUTH DAILY AT BEDTIME 02/09/16  Yes [name], MD  eltrombopag (PROMACTA) 75 MG tablet Take 1 tablet (75 mg total) by mouth daily. Take on an empty stomach 1 hour before a meal or 2 hours after Patient taking differently: Take 75 mg by mouth daily before lunch. Take on an empty stomach 1 hour before a meal or 2 hours after 03/29/16  Yes Heath Lark, MD  ferrous sulfate 325 (65 FE) MG tablet Take 325 mg by mouth daily after supper.    Yes Historical Provider, MD  fluticasone (FLONASE) 50 MCG/ACT nasal spray Place 1 spray into both nostrils at bedtime.  03/16/16  Yes Historical Provider, MD  hydrocortisone cream 1 % Apply 1 application topically at bedtime as needed for itching.   Yes Historical Provider, MD  Insulin Glargine (LANTUS SOLOSTAR) 100 UNIT/ML Solostar Pen INJECT 15 UNITS INTO THE SKIN EACH  MORNING Patient taking differently: Inject 15 Units into the skin daily after breakfast. INJECT 15 UNITS INTO THE SKIN EACH MO 02/21/16  Yes Shanker Kristeen Mans, MD  meclizine (ANTIVERT) 12.5 MG tablet Take 1 tablet (12.5 mg total) by mouth 3 (three) times daily as needed for dizziness. 06/27/15  Yes [name], MD  metoCLOPramide (REGLAN) 5 MG tablet Take 1 tablet (5 mg total) by mouth 3 (three) times daily before meals. Patient taking differently: Take 5 mg by mouth 3 (three) times daily as needed for nausea or vomiting.  03/25/15  Yes [name], MD  metoprolol succinate (TOPROL-XL) 100 MG 24 hr tablet TAKE 1 TABLET BY MOUTH ONCE A DAY 12/11/15  Yes Cletus Gash T  Pickard, MD  Multiple Vitamin (MULTIVITAMIN WITH MINERALS) TABS tablet Take 1 tablet by mouth at bedtime.   Yes Historical Provider, MD  OVER THE COUNTER MEDICATION Place 1 drop into both eyes daily as needed (dry eyes). Over the counter lubricating eye drop   Yes Historical Provider, MD  valsartan (DIOVAN) 160 MG tablet Take 1 tablet (160 mg total) by mouth daily. 11/14/15  Yes Tanda Rockers, MD  clopidogrel (PLAVIX) 75 MG tablet TAKE 1 TABLET BY MOUTH DAILY WITH BREAKFAST. Patient taking differently: Take 75 mg by mouth daily with breakfast.  09/01/15   [name], MD  glucose blood test strip 1 each by Other route daily. Fasting each morning 10/21/15   [name], MD    Allergies    Glucophage [metformin hydrochloride]; Zetia [ezetimibe]; Fenofibrate; and Niacin-lovastatin er  I reviewed & agree with nursing's documentation on the patient's past medical, surgical, social & family histories as well as their allergies.  Review of Systems  Complete ROS obtained, and is negative except as stated in HPI.  Physical Exam  Updated Vital Signs BP 138/74   Pulse 92   Temp 98.6 F (37 C) (Oral)   Resp 20   Ht 5' 7"  (1.702 m)   Wt 85.3 kg   SpO2 100%   BMI 29.44 kg/m  I have reviewed the triage vital signs and the  nursing notes. Physical Exam CONST: Patient oriented to person, place and time, in mild to moderate distress.  EYES: PERRLA. EOMI. Conjunctiva w/o d/c. Lids AT w/o swelling.  ENMT: External Nares & Ears AT w/o swelling. Oropharynx patent. MM dry.  NECK: ROM full w/o rigidity. Trachea midline. JVD absent.  CVS: +S1/S2 w/o obvious murmur. Lower extremities w/o pitting edema.  RESP: Respiratory effort unlabored w/o retractions & accessory muscle use. BS clear bilaterally. Left pleural chest drain in place w/ pleural catheter not actively draining and w/o erythema or warmth or induration.  GI: Soft & ND. +BS x 4. TTP absent. Hernia absent. Guarding & Rebound absent.  BACK: CVA TTP absent bilaterally.  SKIN: Skin warm & dry. Turgor good. No rash.  PSYCH: Alert. Oriented. Affect and mood appropriate.  NEURO: CN II-XII grossly intact. Motor exam symmetric w/ upper & lower extremities 5/5 bilaterally. Sensation grossly intact.  MSK: Joints located & stable, w/o obvious dislocation & obvious deformity or crepitus absent w/ Cap refill < 2 sec. Peripheral pulses 2+ & equal in all extremities.   ED Treatments & Results   Labs (only abnormal results are displayed) Labs Reviewed  COMPREHENSIVE METABOLIC PANEL - Abnormal; Notable for the following:       Result Value   Sodium 132 (*)    Chloride 100 (*)    Glucose, Bld 199 (*)    Creatinine, Ser 1.60 (*)    Calcium 8.3 (*)    Albumin 2.6 (*)    GFR calc non Af Amer 38 (*)    GFR calc Af Amer 44 (*)    All other components within normal limits  URINALYSIS, ROUTINE W REFLEX MICROSCOPIC (NOT AT [facility]) - Abnormal; Notable for the following:    Color, Urine AMBER (*)    APPearance CLOUDY (*)    Hgb urine dipstick SMALL (*)    Protein, ur 100 (*)    All other components within normal limits  CBC WITH DIFFERENTIAL/PLATELET - Abnormal; Notable for the following:    WBC 12.9 (*)    Hemoglobin 10.5 (*)    HCT 34.2 (*)  MCH 24.0 (*)    RDW 23.3 (*)      Platelets 96 (*)    Neutro Abs 8.6 (*)    Monocytes Absolute 2.2 (*)    All other components within normal limits  URINE MICROSCOPIC-ADD ON - Abnormal; Notable for the following:    Squamous Epithelial / LPF 0-5 (*)    Bacteria, UA RARE (*)    Casts GRANULAR CAST (*)    All other components within normal limits  CBG MONITORING, ED - Abnormal; Notable for the following:    Glucose-Capillary 141 (*)    All other components within normal limits  URINE CULTURE  CULTURE, BLOOD (ROUTINE X 2)  CULTURE, BLOOD (ROUTINE X 2)  PROTIME-INR  APTT  COMPREHENSIVE METABOLIC PANEL  CBC WITH DIFFERENTIAL/PLATELET  COMPREHENSIVE METABOLIC PANEL  I-STAT CG4 LACTIC ACID, ED  I-STAT CG4 LACTIC ACID, ED  I-STAT CG4 LACTIC ACID, ED  I-STAT CG4 LACTIC ACID, ED    EKG    EKG Interpretation  Date/Time:    Ventricular Rate:    PR Interval:    QRS Duration:   QT Interval:    QTC Calculation:   R Axis:     Text Interpretation:         Radiology Dg Chest 2 View  Result Date: 04/03/2016 CLINICAL DATA:  Sepsis.  Concern for empyema EXAM: CHEST  2 VIEW COMPARISON:  03/03/2016 FINDINGS: Loculated left pleural effusion, moderate size, thickest at the base. There is a tunneled pleural catheter at the left base with alignment that is stable from 04/02/2016. The underlying lung is partly atelectatic. Normal heart size. The lower aorta is obscured by a medial pleural disease. IMPRESSION: Moderate loculated left pleural effusion with tunneled pleural catheter. Appearance is stable compared to 04/02/2016 PET-CT. Electronically Signed   By: Monte Fantasia M.D.   On: 04/03/2016 20:58   Ct Chest W Contrast  Result Date: 04/03/2016 CLINICAL DATA:  82 year old male with fever. Patient with left pleural drainage catheter. History of lymphoma. EXAM: CT CHEST WITH CONTRAST TECHNIQUE: Multidetector CT imaging of the chest was performed during intravenous contrast administration. CONTRAST:  48m ISOVUE-300  IOPAMIDOL (ISOVUE-300) INJECTION 61% COMPARISON:  04/02/2016 PET-CT and prior studies. FINDINGS: Cardiovascular: Upper limits normal heart size and moderate coronary artery calcifications noted. There is no evidence of thoracic aortic aneurysm. Mediastinum/Nodes: Multiple shotty and enlarged mediastinal lymph nodes are identified, the largest in the lower thoracic para-aortic region. These mediastinal lymph nodes for mildly hypermetabolic on recent PET study suspicious for recurrent lymphoma. There is no evidence of pericardial effusion. Lungs/Pleura: A moderate loculated left pleural effusion is again identified. A left pleural catheter with tip in the posterior-inferior left pleural space again noted. Left lower lung atelectasis identified. Mild interlobular septal thickening is noted within both lungs. No discrete mass identified. There is no evidence of pneumothorax or right pleural effusion. Upper Abdomen: No acute abnormality. Upper limits normal spleen size noted. Musculoskeletal: No acute or suspicious abnormality. Degenerative disc disease in the lower cervical spine identified. IMPRESSION: Moderate loculated left pleural effusion, with left pleural catheter tip in the posterior inferior left pleural space. Associated left lower lung atelectasis. Mediastinal lymph nodes as described, mildly hypermetabolic on recent PET study suspicious for recurrent lymphoma. Coronary artery disease. Electronically Signed   By: JMargarette CanadaM.D.   On: 04/03/2016 21:47   Nm Pet Image Restag (ps) Skull Base To Thigh  Result Date: 04/02/2016 CLINICAL DATA:  Subsequent treatment strategy for lymphoma. EXAM: NUCLEAR MEDICINE PET SKULL  BASE TO THIGH TECHNIQUE: 12.4 mCi F-18 FDG was injected intravenously. Full-ring PET imaging was performed from the skull base to thigh after the radiotracer. CT data was obtained and used for attenuation correction and anatomic localization. FASTING BLOOD GLUCOSE:  Value: 119 mg/dl COMPARISON:   10/30/2014 FINDINGS: NECK No hypermetabolic lymph nodes in the neck. CHEST There is a moderate-sized left loculated pleural effusion. No hypermetabolism to suggest a malignant effusion. It measures as simple fluid. New line small nodules are noted along the left major fissure. These are likely lymph nodes. Is also 3 subpleural nodules in the left upper on image number 104. No obvious hypermetabolism. SUV max is 1.7. These are suspicious for lymphomatous nodules however and they are new since the prior study. Enlarging mediastinal lymph nodes are mildly hypermetabolic and suspicious for recurrent lymphoma. 5 mm pretracheal lymph node on image number 87 has an SUV max of 2.56. Multiple small prevascular lymph nodes. The largest node measures 5 mm on image number 100 and SUV max is 2.46. 11 mm aorticopulmonary window lymph node on image number 94 has an SUV max of 3.5. Subcarinal lymph node on image number 109 measures 10 mm and has an SUV max of 3.5. Left-sided retrocrural lymph node measures 23 x 16 mm on image number 127 and has SUV max of 5.9. Right-sided periaortic lymph node on image number 121 measures 8.5 mm and SUV max is 4.1. ABDOMEN/PELVIS No abnormal hypermetabolic activity within the liver, pancreas, adrenal glands, or spleen. No hypermetabolic lymph nodes in the abdomen or pelvis. Stable advanced atherosclerotic calcifications involving the aorta and branch vessels. The right renal artery stent is noted. Brachytherapy seeds are noted the prostate gland. There is a large right inguinal hernia containing fat. SKELETON No focal hypermetabolic activity to suggest skeletal metastasis. IMPRESSION: 1. Enlarging hypermetabolic mediastinal lymph nodes suggesting recurrent lymphoma. 2. New nodules along the left major fissure and in the left upper lobe subpleural space worrisome for lymphomatous involvement. 3. Moderate-sized loculated left pleural effusion collection with a PleurX drainage catheter in place. 4.  No findings for adenopathy in the neck, abdomen or pelvis. Electronically Signed   By: Marijo Sanes M.D.   On: 04/02/2016 14:52    Pertinent labs & imaging results that were available during my care of the patient were independently visualized by me and considered in my medical decision making, please see chart for details. Formal interpretation provided by Radiology.  Procedures (including critical care time) Procedures  Medications Ordered in ED Medications  vancomycin (VANCOCIN) 1,250 mg in sodium chloride 0.9 % 250 mL IVPB (not administered)  piperacillin-tazobactam (ZOSYN) IVPB 3.375 g (3.375 g Intravenous New Bag/Given 04/04/16 0133)  sodium chloride flush (NS) 0.9 % injection 3 mL (not administered)  ondansetron (ZOFRAN) tablet 4 mg (not administered)    Or  ondansetron (ZOFRAN) injection 4 mg (not administered)  albuterol (PROVENTIL) (2.5 MG/3ML) 0.083% nebulizer solution 2.5 mg (not administered)  ipratropium (ATROVENT) nebulizer solution 0.5 mg (not administered)  albuterol (PROVENTIL) (2.5 MG/3ML) 0.083% nebulizer solution 2.5 mg (not administered)  cyanocobalamin ((VITAMIN B-12)) injection 1,000 mcg (not administered)  meclizine (ANTIVERT) tablet 12.5 mg (not administered)  metoCLOPramide (REGLAN) tablet 5 mg (not administered)  acetaminophen (TYLENOL) tablet 650 mg (not administered)  irbesartan (AVAPRO) tablet 150 mg (not administered)  metoprolol succinate (TOPROL-XL) 24 hr tablet 100 mg (not administered)  doxazosin (CARDURA) tablet 4 mg (not administered)  ferrous sulfate tablet 325 mg (not administered)  Insulin Glargine (LANTUS) Solostar Pen 15 Units (not  administered)  fluticasone (FLONASE) 50 MCG/ACT nasal spray 1 spray (not administered)  eltrombopag (PROMACTA) tablet 75 mg (not administered)  multivitamin with minerals tablet 1 tablet (not administered)  dextromethorphan (DELSYM) 30 MG/5ML liquid 30 mg (not administered)  insulin aspart (novoLOG) injection 0-15  Units (not administered)  sodium chloride 0.9 % bolus 2,600 mL (0 mLs Intravenous Stopped 04/03/16 2221)  piperacillin-tazobactam (ZOSYN) IVPB 3.375 g (0 g Intravenous Stopped 04/03/16 2048)  vancomycin (VANCOCIN) IVPB 1000 mg/200 mL premix (0 mg Intravenous Stopped 04/03/16 2221)  acetaminophen (TYLENOL) tablet 650 mg (650 mg Oral Given 04/03/16 1921)  iopamidol (ISOVUE-300) 61 % injection (75 mLs  Contrast Given 04/03/16 2114)    Initial Impression & Plan / ED Course & Results / Final Disposition   Initial Impression & Plan Patient presents to the emergency department for assessment of fevers chills and concern of clogged left-sided pleural catheter. Patient had PET scan yesterday that showed development of empyema from initial catheter placement on 02/10/2016 for recurrent effusions related to patient's non-Hodgkin's lymphoma.  Because of my suspicion for an infectious etiology and the clinical criteria for Sepsis was met, I initiated a "Code Sepsis". I obtained rapid IV access along w/ screening labs, serial lactates, multiple blood cultures, & urine studies/cultures. I also obtained CXR to look for other possible sources of infection.  Obvious source was initially unclear, therefore I administered empiric IV antibiotic regimen of Vancomycin & Zosyn.  I administered IV Fluid resuscitation w/ an initial bolus of 30 ml/kg of NS. I obtained consultation from cardiothoracic surgery and after discussion with her service decision was made to obtain repeat CT chest w/ contrast for reassessment in AM by CTS s/p medical stabilization.  ED Course & Results Upon review patient's labs patient has a leukocytosis with left shift with lactic acid that is within normal limits.  Final Disposition Reassessment of the patient reveals patient will require admission and treatment for acute bacteremia concerns in setting of worsening oncologic process and empyema causing clogged pleural drain. In light of above  & the complexity of the patient's medical condition, I believe the patient will require admission for continued medical intervention. ED Course in its entirety was reviewed w/ the patient, spouse/SO and relative(s). I therefore consulted the Hospitalist Service for admission & we discussed the patient's ED course & they have agreed to admit. They request no further interventions prior to the patient's transport from the ED. Patient stable for transport. Level of Care determined by the Admitting Service.  Final Clinical Impression & ED Diagnoses   1. B12 deficiency anemia    Patient care discussed with the attending physician, Dr. Darl Householder, who oversaw their evaluation & treatment & voiced agreement.  Note: This document was prepared using Dragon voice recognition software and may include unintentional dictation errors.  House Officer: Voncille Lo, MD, Emergency Medicine Resident.   Voncille Lo, MD 04/04/16 0149    Drenda Freeze, MD 04/04/16 [phone]

## 2016-04-03 NOTE — ED Notes (Signed)
[facility] contacted to call Code Sepsis 

## 2016-04-04 DIAGNOSIS — Z8579 Personal history of other malignant neoplasms of lymphoid, hematopoietic and related tissues: Secondary | ICD-10-CM

## 2016-04-04 DIAGNOSIS — E119 Type 2 diabetes mellitus without complications: Secondary | ICD-10-CM

## 2016-04-04 DIAGNOSIS — I2510 Atherosclerotic heart disease of native coronary artery without angina pectoris: Secondary | ICD-10-CM

## 2016-04-04 DIAGNOSIS — J90 Pleural effusion, not elsewhere classified: Secondary | ICD-10-CM

## 2016-04-04 LAB — COMPREHENSIVE METABOLIC PANEL
ALK PHOS: 58 U/L (ref 38–126)
ALT: 14 U/L — ABNORMAL LOW (ref 17–63)
ALT: 15 U/L — AB (ref 17–63)
ANION GAP: 8 (ref 5–15)
AST: 14 U/L — ABNORMAL LOW (ref 15–41)
AST: 14 U/L — ABNORMAL LOW (ref 15–41)
Albumin: 2.1 g/dL — ABNORMAL LOW (ref 3.5–5.0)
Albumin: 2.1 g/dL — ABNORMAL LOW (ref 3.5–5.0)
Alkaline Phosphatase: 58 U/L (ref 38–126)
Anion gap: 8 (ref 5–15)
BUN: 13 mg/dL (ref 6–20)
BUN: 13 mg/dL (ref 6–20)
CALCIUM: 7.6 mg/dL — AB (ref 8.9–10.3)
CHLORIDE: 107 mmol/L (ref 101–111)
CO2: 21 mmol/L — AB (ref 22–32)
CO2: 21 mmol/L — AB (ref 22–32)
CREATININE: 1.47 mg/dL — AB (ref 0.61–1.24)
Calcium: 7.5 mg/dL — ABNORMAL LOW (ref 8.9–10.3)
Chloride: 107 mmol/L (ref 101–111)
Creatinine, Ser: 1.45 mg/dL — ABNORMAL HIGH (ref 0.61–1.24)
GFR calc non Af Amer: 43 mL/min — ABNORMAL LOW (ref >60)
GFR calc non Af Amer: 42 mL/min — ABNORMAL LOW (ref >60)
GFR, EST AFRICAN AMERICAN: 50 mL/min — AB (ref >60)
GFR, EST AFRICAN AMERICAN: 49 mL/min — AB (ref >60)
Glucose, Bld: 150 mg/dL — ABNORMAL HIGH (ref 65–99)
Glucose, Bld: 165 mg/dL — ABNORMAL HIGH (ref 65–99)
POTASSIUM: 3.9 mmol/L (ref 3.5–5.1)
Potassium: 3.7 mmol/L (ref 3.5–5.1)
SODIUM: 136 mmol/L (ref 135–145)
SODIUM: 136 mmol/L (ref 135–145)
Total Bilirubin: 0.3 mg/dL (ref 0.3–1.2)
Total Bilirubin: 0.5 mg/dL (ref 0.3–1.2)
Total Protein: 6.8 g/dL (ref 6.5–8.1)
Total Protein: 6.6 g/dL (ref 6.5–8.1)

## 2016-04-04 LAB — CBC WITH DIFFERENTIAL/PLATELET
BASOS PCT: 0 %
Basophils Absolute: 0 10*3/uL (ref 0.0–0.1)
EOS ABS: 0 10*3/uL (ref 0.0–0.7)
EOS PCT: 0 %
HCT: 30.3 % — ABNORMAL LOW (ref 39.0–52.0)
HEMOGLOBIN: 9 g/dL — AB (ref 13.0–17.0)
LYMPHS PCT: 18 %
Lymphs Abs: 2.4 10*3/uL (ref 0.7–4.0)
MCH: 23.5 pg — AB (ref 26.0–34.0)
MCHC: 29.7 g/dL — ABNORMAL LOW (ref 30.0–36.0)
MCV: 79.1 fL (ref 78.0–100.0)
Monocytes Absolute: 3 10*3/uL — ABNORMAL HIGH (ref 0.1–1.0)
Monocytes Relative: 22 %
NEUTROS PCT: 60 %
Neutro Abs: 8.1 10*3/uL — ABNORMAL HIGH (ref 1.7–7.7)
Platelets: 74 10*3/uL — ABNORMAL LOW (ref 150–400)
RBC: 3.83 MIL/uL — ABNORMAL LOW (ref 4.22–5.81)
RDW: 23.5 % — ABNORMAL HIGH (ref 11.5–15.5)
WBC: 13.5 10*3/uL — ABNORMAL HIGH (ref 4.0–10.5)

## 2016-04-04 LAB — GLUCOSE, CAPILLARY
GLUCOSE-CAPILLARY: 145 mg/dL — AB (ref 65–99)
Glucose-Capillary: 123 mg/dL — ABNORMAL HIGH (ref 65–99)
Glucose-Capillary: 126 mg/dL — ABNORMAL HIGH (ref 65–99)

## 2016-04-04 LAB — CBG MONITORING, ED
GLUCOSE-CAPILLARY: 141 mg/dL — AB (ref 65–99)
Glucose-Capillary: 127 mg/dL — ABNORMAL HIGH (ref 65–99)

## 2016-04-04 LAB — PLATELET INHIBITION P2Y12: Platelet Function  P2Y12: 205 [PRU] (ref 194–418)

## 2016-04-04 MED ORDER — IRBESARTAN 150 MG PO TABS
150.0000 mg | ORAL_TABLET | Freq: Every day | ORAL | Status: DC
  Administered 2016-04-04 – 2016-04-10 (×7): 150 mg via ORAL
  Filled 2016-04-04 (×8): qty 1

## 2016-04-04 MED ORDER — ONDANSETRON HCL 4 MG PO TABS: 4.0000 mg | ORAL_TABLET | Freq: Four times a day (QID) | ORAL | Status: DC | PRN

## 2016-04-04 MED ORDER — INSULIN ASPART 100 UNIT/ML ~~LOC~~ SOLN
0.0000 [IU] | Freq: Three times a day (TID) | SUBCUTANEOUS | Status: DC
  Administered 2016-04-04 – 2016-04-06 (×3): 2 [IU] via SUBCUTANEOUS
  Administered 2016-04-07 – 2016-04-09 (×2): 5 [IU] via SUBCUTANEOUS
  Administered 2016-04-09: 2 [IU] via SUBCUTANEOUS
  Administered 2016-04-10 (×2): 3 [IU] via SUBCUTANEOUS
  Administered 2016-04-11: 2 [IU] via SUBCUTANEOUS
  Administered 2016-04-11 – 2016-04-12 (×3): 5 [IU] via SUBCUTANEOUS
  Administered 2016-04-13: 2 [IU] via SUBCUTANEOUS
  Administered 2016-04-13: 3 [IU] via SUBCUTANEOUS
  Administered 2016-04-13: 5 [IU] via SUBCUTANEOUS
  Administered 2016-04-14: 2 [IU] via SUBCUTANEOUS
  Administered 2016-04-14: 3 [IU] via SUBCUTANEOUS
  Administered 2016-04-14: 2 [IU] via SUBCUTANEOUS
  Administered 2016-04-15: 3 [IU] via SUBCUTANEOUS
  Administered 2016-04-15 – 2016-04-16 (×3): 2 [IU] via SUBCUTANEOUS
  Administered 2016-04-16 – 2016-04-18 (×3): 3 [IU] via SUBCUTANEOUS
  Administered 2016-04-18 – 2016-04-19 (×2): 5 [IU] via SUBCUTANEOUS
  Administered 2016-04-19 – 2016-04-20 (×2): 3 [IU] via SUBCUTANEOUS

## 2016-04-04 MED ORDER — SODIUM CHLORIDE 0.9 % IV SOLN
INTRAVENOUS | Status: DC
  Administered 2016-04-04 – 2016-04-07 (×4): via INTRAVENOUS

## 2016-04-04 MED ORDER — ADULT MULTIVITAMIN W/MINERALS CH
1.0000 | ORAL_TABLET | Freq: Every day | ORAL | Status: DC
  Administered 2016-04-04 – 2016-04-19 (×16): 1 via ORAL
  Filled 2016-04-04 (×16): qty 1

## 2016-04-04 MED ORDER — ALBUTEROL SULFATE (2.5 MG/3ML) 0.083% IN NEBU
2.5000 mg | INHALATION_SOLUTION | RESPIRATORY_TRACT | Status: DC | PRN
  Administered 2016-04-06: 2.5 mg via RESPIRATORY_TRACT
  Filled 2016-04-04: qty 3

## 2016-04-04 MED ORDER — ALBUTEROL SULFATE (2.5 MG/3ML) 0.083% IN NEBU
2.5000 mg | INHALATION_SOLUTION | Freq: Four times a day (QID) | RESPIRATORY_TRACT | Status: DC
  Administered 2016-04-04 (×2): 2.5 mg via RESPIRATORY_TRACT
  Filled 2016-04-04 (×2): qty 3

## 2016-04-04 MED ORDER — MECLIZINE HCL 12.5 MG PO TABS: 12.5000 mg | ORAL_TABLET | Freq: Three times a day (TID) | ORAL | Status: DC | PRN

## 2016-04-04 MED ORDER — DOXAZOSIN MESYLATE 2 MG PO TABS
4.0000 mg | ORAL_TABLET | Freq: Every day | ORAL | Status: DC
  Administered 2016-04-04 – 2016-04-19 (×16): 4 mg via ORAL
  Filled 2016-04-04 (×16): qty 2

## 2016-04-04 MED ORDER — METOPROLOL SUCCINATE ER 100 MG PO TB24
100.0000 mg | ORAL_TABLET | Freq: Every day | ORAL | Status: DC
Start: 2016-04-04 — End: 2016-04-20
  Administered 2016-04-04 – 2016-04-20 (×17): 100 mg via ORAL
  Filled 2016-04-04 (×19): qty 1

## 2016-04-04 MED ORDER — ACETAMINOPHEN 325 MG PO TABS
650.0000 mg | ORAL_TABLET | Freq: Four times a day (QID) | ORAL | Status: DC | PRN
  Administered 2016-04-04 – 2016-04-20 (×16): 650 mg via ORAL
  Filled 2016-04-04 (×16): qty 2

## 2016-04-04 MED ORDER — CYANOCOBALAMIN 1000 MCG/ML IJ SOLN: 1000.0000 ug | INTRAMUSCULAR | Status: DC

## 2016-04-04 MED ORDER — ONDANSETRON HCL 4 MG/2ML IJ SOLN: 4.0000 mg | Freq: Four times a day (QID) | INTRAMUSCULAR | Status: DC | PRN

## 2016-04-04 MED ORDER — IPRATROPIUM BROMIDE 0.02 % IN SOLN
0.5000 mg | Freq: Four times a day (QID) | RESPIRATORY_TRACT | Status: DC
  Administered 2016-04-04 (×2): 0.5 mg via RESPIRATORY_TRACT
  Filled 2016-04-04 (×2): qty 2.5

## 2016-04-04 MED ORDER — FLUTICASONE PROPIONATE 50 MCG/ACT NA SUSP
1.0000 | Freq: Every day | NASAL | Status: DC
  Administered 2016-04-05 – 2016-04-19 (×15): 1 via NASAL
  Filled 2016-04-04 (×2): qty 16

## 2016-04-04 MED ORDER — SODIUM CHLORIDE 0.9% FLUSH
3.0000 mL | Freq: Two times a day (BID) | INTRAVENOUS | Status: DC
  Administered 2016-04-04 – 2016-04-20 (×16): 3 mL via INTRAVENOUS

## 2016-04-04 MED ORDER — DEXTROMETHORPHAN POLISTIREX ER 30 MG/5ML PO SUER
30.0000 mg | Freq: Every day | ORAL | Status: DC
  Administered 2016-04-04 – 2016-04-19 (×16): 30 mg via ORAL
  Filled 2016-04-04 (×16): qty 5

## 2016-04-04 MED ORDER — FERROUS SULFATE 325 (65 FE) MG PO TABS
325.0000 mg | ORAL_TABLET | Freq: Every day | ORAL | Status: DC
  Administered 2016-04-04 – 2016-04-19 (×15): 325 mg via ORAL
  Filled 2016-04-04 (×14): qty 1

## 2016-04-04 MED ORDER — INSULIN GLARGINE 100 UNIT/ML ~~LOC~~ SOLN
15.0000 [IU] | Freq: Every day | SUBCUTANEOUS | Status: DC
  Administered 2016-04-04 – 2016-04-20 (×17): 15 [IU] via SUBCUTANEOUS
  Filled 2016-04-04 (×18): qty 0.15

## 2016-04-04 MED ORDER — IPRATROPIUM-ALBUTEROL 0.5-2.5 (3) MG/3ML IN SOLN
3.0000 mL | Freq: Three times a day (TID) | RESPIRATORY_TRACT | Status: DC
  Administered 2016-04-04: 3 mL via RESPIRATORY_TRACT
  Filled 2016-04-04: qty 3

## 2016-04-04 MED ORDER — ELTROMBOPAG OLAMINE 75 MG PO TABS
75.0000 mg | ORAL_TABLET | Freq: Every day | ORAL | Status: DC
  Administered 2016-04-04 – 2016-04-20 (×17): 75 mg via ORAL
  Filled 2016-04-04 (×27): qty 1

## 2016-04-04 MED ORDER — METOCLOPRAMIDE HCL 5 MG PO TABS
5.0000 mg | ORAL_TABLET | Freq: Three times a day (TID) | ORAL | Status: DC | PRN
  Administered 2016-04-10 (×2): 5 mg via ORAL
  Filled 2016-04-04 (×2): qty 1

## 2016-04-04 NOTE — ED Notes (Signed)
Dr. Lawson Fiscal at bedside.

## 2016-04-04 NOTE — Progress Notes (Signed)
Pt has arrived to [facility] from [facility] ED. Pt denies pain at this time. Telemetry box has been applied and CCMD has been notified. Pt has been oriented to the room. Family is at bedside. Pt is resting in bed with call light within reach. Will continue plan of care.  Grant Fontana BSN, RN

## 2016-04-04 NOTE — Progress Notes (Signed)
PROGRESS NOTE    Ricardo Watkins  MHD:[id number] DOB: 1930-12-06 DOA: 04/03/2016 PCP: [name], MD  Brief Narrative:  Ricardo Watkins is a 82 y.o. male with medical history significant of non-Hodgkin's lymphoma, CAD, type 2 diabetes, hyperlipidemia, hypertension, anemia, prostate CA, diverticulosis, cataracts, DVT, chronic cough, chronic left pleural effusion with insertion of left pleural catheter on 11/10/2015 by [name] who comes to the hospital due to fever, chills, worsening fatigue and malaise. Per patient, his daughter and wife, his chest tube drainage has been decreasing his output for the past several days and he stopped completely yesterday. He normally has up to 500 mL of drainage daily. Patient was worked up and found to be septic and have a loculated effusion. CT Surgery was consulted and patient to undergo a Pigtail Catheter Drain by IR in Am.   Assessment & Plan:   Principal Problem:   Empyema ([place]) Active Problems:   CAD (coronary artery disease)   History of lymphoma   Essential hypertension   Thrombocytopenia (HCC)   Microcytic anemia   Type 2 diabetes mellitus (HCC)  Sepsis 2/2 to Loculated Effusion/Empyema from Left Obstructed Pleural Catheter for recurrent and chronic Non Malignant Pleural Effusions, improving -Patient was Febrile when he came in and Bolused 2600 mL of NS -C/w IV Vancomycin and IV Zosyn -WBC was 12.9 on admission and increased to 13.5; Repeat CBC in AM -Patient was Febrile at 102.3 -PET-CT Scan showed Moderate-sized loculated left pleural effusion collection with a PleurX drainage catheter in place. -[name] of Cardiothoracic Surgery Saw patient and will evaluated; Consulted IR to place CT Guided Fluid Drain by PigTail Catheter; Will Need Fluid Cx -Continue Supplemental Oxygen. -May need VATS procedure -Analgesics as needed. -C/w NS at 75 mL/hr -Lactic Acid was 1.15 -NPO After Midnight  Hx of Non-Hodgkins Lymphoma -PET-CT  Scan showed Enlarging hypermetabolic mediastinal lymph nodes suggesting recurrent lymphoma. New nodules along the left major fissure and in the left upper lobe subpleural space worrisome for lymphomatous involvement. 5 mm pretracheal lymph node on image number 87 has an SUV max of 2.56. Multiple small prevascular lymph nodes. The largest node measures 5 mm on image number 100 and SUV max is 2.46. 11 mm aorticopulmonary window lymph node on image number 94 has an SUV max of 3.5. Subcarinal lymph node on image number 109 measures 10 mm and has an SUV max of 3.5. Left-sided retrocrural lymph node measures 23 x 16 mm on image number 127 and has SUV max of 5.9. Right-sided periaortic lymph node on image number 121 measures 8.5 mm and SUV max is 4.1. -Continue scheduled follow-ups with Hematology/Oncology after empyema treatment.  Insulin Dependent Type 2 Diabetes Mellitus  -BS was 150 and 165 on BMP -Last Hemoglobin A1c was 6.7% in July. -Continue lantus 15 units SQ daily. -CBG monitoring with regular insulin sliding scale.  CAD  -Continue metoprolol 100 mg by mouth daily and Irbesartan 150 mg -Clopidogrel was helped by Hematology/Oncology because of Thrombocytopenia.  Essential Hypertension -Continue Metoprolol 100 mg po daily and Irbesartan 150 mg po daily. -Monitor blood pressure, BUN/creatinine and electrolytes.  Thrombocytopenia  -Platelet Count was 74 -SCDs for DVT prophylaxis. -Continue Promacta. -Hematology Held Clopidogrel -Follow-up with hematology as scheduled. -Monitor platelet levels by repeat CBC.  Microcytic Anemia -C/w Ferrous Sulfate 325 mg tablet po  -Hb/Hct was 9.0 and 30.3   DVT prophylaxis: SCDs Code Status: FULL Family Communication: Discussed Plan of Care with Wife and with Patient's Son and  daughter-in-law. Disposition Plan: Home vs. Home Health.  Consultants:   Cardiothoracic Surgery [name]  Intervential Radiology  Procedures:    Antimicrobials: IV Vanc and Zosyn  Subjective: Patient seen and examined bedside and states he feels better. States his Pluer-X cathether stopped draining. Discussed results of PET-CT and patient understood. No CP/N/V but had some SOB. No other complaints or concerns at this time.   Objective: Vitals:   04/04/16 0930 04/04/16 1000 04/04/16 1304 04/04/16 1352  BP:  (!) 152/59  (!) 129/56  Pulse:  100  (!) 103  Resp:    20  Temp: 97.7 F (36.5 C) 97.9 F (36.6 C)  98.6 F (37 C)  TempSrc: Oral Oral  Oral  SpO2:  100% 100% 98%  Weight:  85.5 kg (188 lb 9.6 oz)    Height:  5\' 7"  (1.702 m)      Intake/Output Summary (Last 24 hours) at 04/04/16 1441 Last data filed at 04/04/16 1321  Gross per 24 hour  Intake             3090 ml  Output             1800 ml  Net             1290 ml   Filed Weights   04/03/16 1946 04/03/16 2026 04/04/16 1000  Weight: 85.3 kg (188 lb) 85.3 kg (188 lb) 85.5 kg (188 lb 9.6 oz)    Examination: Physical Exam:  Constitutional: Elderly Male calm and comfortable Eyes:  Lids and conjunctivae normal, sclerae anicteric  ENMT: External Ears, Nose appear normal. Grossly normal hearing.  Neck: Appears normal, supple, no cervical masses, normal ROM, no appreciable thyromegaly, no JVD Respiratory: Diminished Breath sounds on Left. No wheezing, rales, rhonchi or crackles. Normal respiratory effort and patient is not tachypenic. No accessory muscle use.  Cardiovascular: RRR, no murmurs / rubs / gallops. S1 and S2 auscultated. No extremity edema. 2+ pedal pulses. No carotid bruits.  Abdomen: Soft, non-tender, non-distended. No masses palpated. No appreciable hepatosplenomegaly. Bowel sounds positive.  GU: Deferred. Musculoskeletal: No clubbing / cyanosis of digits/nails. No joint deformity upper and lower extremities.  Skin: No rashes, lesions, ulcers. No induration; Warm and dry. Pleur-X Catheter bandaged. Neurologic: CN 2-12 grossly intact with no focal  deficits. Sensation intact in all 4 Extremities. Strength 5/5 in all 4. Romberg sign cerebellar reflexes not assessed.  Psychiatric: Normal judgment and insight. Alert and oriented x 3. Normal mood and appropriate affect.   Data Reviewed: I have personally reviewed following labs and imaging studies  CBC:  Recent Labs Lab 04/03/16 1505 04/04/16 0405  WBC 12.9* 13.5*  NEUTROABS 8.6* 8.1*  HGB 10.5* 9.0*  HCT 34.2* 30.3*  MCV 78.1 79.1  PLT 96* 74*   Basic Metabolic Panel:  Recent Labs Lab 04/03/16 1505 04/04/16 0124 04/04/16 0405  NA 132* 136 136  K 3.9 3.9 3.7  CL 100* 107 107  CO2 23 21* 21*  GLUCOSE 199* 150* 165*  BUN 11 13 13   CREATININE 1.60* 1.45* 1.47*  CALCIUM 8.3* 7.5* 7.6*   GFR: Estimated Creatinine Clearance: 39.8 mL/min (by C-G formula based on SCr of 1.47 mg/dL (H)). Liver Function Tests:  Recent Labs Lab 04/03/16 1505 04/04/16 0124 04/04/16 0405  AST 20 14* 14*  ALT 17 14* 15*  ALKPHOS 75 58 58  BILITOT 0.3 0.3 0.5  PROT 7.9 6.8 6.6  ALBUMIN 2.6* 2.1* 2.1*   No results for input(s): LIPASE, AMYLASE in  the last 168 hours. No results for input(s): AMMONIA in the last 168 hours. Coagulation Profile:  Recent Labs Lab 04/03/16 1910  INR 1.19   Cardiac Enzymes: No results for input(s): CKTOTAL, CKMB, CKMBINDEX, TROPONINI in the last 168 hours. BNP (last 3 results)  Recent Labs  01/27/16 1119  PROBNP 311.0*   HbA1C: No results for input(s): HGBA1C in the last 72 hours. CBG:  Recent Labs Lab 04/02/16 1127 04/04/16 0026 04/04/16 0826 04/04/16 1128  GLUCAP 119* 141* 127* 123*   Lipid Profile: No results for input(s): CHOL, HDL, LDLCALC, TRIG, CHOLHDL, LDLDIRECT in the last 72 hours. Thyroid Function Tests: No results for input(s): TSH, T4TOTAL, FREET4, T3FREE, THYROIDAB in the last 72 hours. Anemia Panel: No results for input(s): VITAMINB12, FOLATE, FERRITIN, TIBC, IRON, RETICCTPCT in the last 72 hours. Sepsis Labs:  Recent  Labs Lab 04/03/16 1525 04/03/16 1924  LATICACIDVEN 1.88 1.15    Recent Results (from the past 240 hour(s))  TECHNOLOGIST REVIEW     Status: None   Collection Time: 03/26/16 12:02 PM  Result Value Ref Range Status   Technologist Review few ovalos & shistocytes. few large & giant plts.  Final    Radiology Studies: Dg Chest 2 View  Result Date: 04/03/2016 CLINICAL DATA:  Sepsis.  Concern for empyema EXAM: CHEST  2 VIEW COMPARISON:  03/03/2016 FINDINGS: Loculated left pleural effusion, moderate size, thickest at the base. There is a tunneled pleural catheter at the left base with alignment that is stable from 04/02/2016. The underlying lung is partly atelectatic. Normal heart size. The lower aorta is obscured by a medial pleural disease. IMPRESSION: Moderate loculated left pleural effusion with tunneled pleural catheter. Appearance is stable compared to 04/02/2016 PET-CT. Electronically Signed   By: Monte Fantasia M.D.   On: 04/03/2016 20:58   Ct Chest W Contrast  Result Date: 04/03/2016 CLINICAL DATA:  82 year old male with fever. Patient with left pleural drainage catheter. History of lymphoma. EXAM: CT CHEST WITH CONTRAST TECHNIQUE: Multidetector CT imaging of the chest was performed during intravenous contrast administration. CONTRAST:  20mL ISOVUE-300 IOPAMIDOL (ISOVUE-300) INJECTION 61% COMPARISON:  04/02/2016 PET-CT and prior studies. FINDINGS: Cardiovascular: Upper limits normal heart size and moderate coronary artery calcifications noted. There is no evidence of thoracic aortic aneurysm. Mediastinum/Nodes: Multiple shotty and enlarged mediastinal lymph nodes are identified, the largest in the lower thoracic para-aortic region. These mediastinal lymph nodes for mildly hypermetabolic on recent PET study suspicious for recurrent lymphoma. There is no evidence of pericardial effusion. Lungs/Pleura: A moderate loculated left pleural effusion is again identified. A left pleural catheter with  tip in the posterior-inferior left pleural space again noted. Left lower lung atelectasis identified. Mild interlobular septal thickening is noted within both lungs. No discrete mass identified. There is no evidence of pneumothorax or right pleural effusion. Upper Abdomen: No acute abnormality. Upper limits normal spleen size noted. Musculoskeletal: No acute or suspicious abnormality. Degenerative disc disease in the lower cervical spine identified. IMPRESSION: Moderate loculated left pleural effusion, with left pleural catheter tip in the posterior inferior left pleural space. Associated left lower lung atelectasis. Mediastinal lymph nodes as described, mildly hypermetabolic on recent PET study suspicious for recurrent lymphoma. Coronary artery disease. Electronically Signed   By: Margarette Canada M.D.   On: 04/03/2016 21:47   Scheduled Meds: . [START ON 05/01/2016] cyanocobalamin  1,000 mcg Intramuscular Q30 days  . dextromethorphan  30 mg Oral QHS  . doxazosin  4 mg Oral QHS  . eltrombopag  75 mg  Oral QAC lunch  . ferrous sulfate  325 mg Oral QPC supper  . fluticasone  1 spray Each Nare QHS  . insulin aspart  0-15 Units Subcutaneous TID WC  . insulin glargine  15 Units Subcutaneous Daily  . ipratropium-albuterol  3 mL Nebulization TID  . irbesartan  150 mg Oral Daily  . metoprolol succinate  100 mg Oral Daily  . multivitamin with minerals  1 tablet Oral QHS  . piperacillin-tazobactam (ZOSYN)  IV  3.375 g Intravenous Q8H  . sodium chloride flush  3 mL Intravenous Q12H  . vancomycin  1,250 mg Intravenous Q24H   Continuous Infusions:    LOS: 1 day   Kerney Elbe, DO [facility] Pager [phone]  If 7PM-7AM, please contact night-coverage [email or web address] Password [email or web address] 04/04/2016, 2:41 PM

## 2016-04-04 NOTE — ED Notes (Signed)
Attempted report 

## 2016-04-04 NOTE — ED Notes (Signed)
Report given to Lauren RN.

## 2016-04-04 NOTE — Progress Notes (Signed)
  Subjective: Patient examined, CT scan of chest personally reviewed and counseled with patient I have followed Mr. Vetsch since earlier this year for placement of left Pleurx catheter for recurrent nonmalignant pleural effusion but with history of lymphoma. On his last visit last month his chest x-ray was clear. Recently he developed a loculated left pleural effusion associated with evidence of infection. He also developed pancytopenia from his history of lymphoma and chemotherapy. He developed a left ear infection followed by fever cough and chest discomfort was found to have a loculated left effusion-empyema. Pleurx catheter drainage also stopped. The patient has history of CAD on chronic Plavix. With his advanced age, pancytopenia and heart disease he would be a candidate for VATS treatment of his loculated effusion-empyema. Currently he is stable with a white count of 13,000 and normal temperature.  Have asked for IR to place a CT-guided drain as the safest initial procedure along with broad spectrum antibiotics. Any fluid removed needs to be cultured. The patient states he was told to stop his Plavix when he was found to have severe pancytopenia with platelet count of 30,000.  Objective: Vital signs in last 24 hours: Temp:  [97.3 F (36.3 C)-102.3 F (39.1 C)] 97.3 F (36.3 C) (10/15 0800) Pulse Rate:  [41-113] 97 (10/15 0800) Resp:  [18-35] 20 (10/15 0800) BP: (118-154)/(54-79) 137/67 (10/15 0800) SpO2:  [90 %-100 %] 100 % (10/15 0800) Weight:  [188 lb (85.3 kg)] 188 lb (85.3 kg) (10/14 2026)  Hemodynamic parameters for last 24 hours:  sinus tachycardia  Intake/Output from previous day: 10/14 0701 - 10/15 0700 In: 2850 [I.V.:500; IV Piggyback:2350] Out: 700 [Urine:700] Intake/Output this shift: Total I/O In: -  Out: 300 [Urine:300]  Exam Chronically ill elderly male breathing comfortably on room air Breath sounds slightly diminished at left base No tenderness or drainage  around left Pleurx catheter site No focal neuro deficit Abdomen soft Heart rate regular without murmur or gallop  Lab Results:  Recent Labs  04/03/16 1505 04/04/16 0405  WBC 12.9* 13.5*  HGB 10.5* 9.0*  HCT 34.2* 30.3*  PLT 96* 74*   BMET:  Recent Labs  04/04/16 0124 04/04/16 0405  NA 136 136  K 3.9 3.7  CL 107 107  CO2 21* 21*  GLUCOSE 150* 165*  BUN 13 13  CREATININE 1.45* 1.47*  CALCIUM 7.5* 7.6*    PT/INR:  Recent Labs  04/03/16 1910  LABPROT 15.1  INR 1.19   ABG No results found for: PHART, HCO3, TCO2, ACIDBASEDEF, O2SAT CBG (last 3)   Recent Labs  04/02/16 1127 04/04/16 0026 04/04/16 0826  GLUCAP 119* 141* 127*    Assessment/Plan: S/P  Loculated left effusion, Pleurx catheter not draining Last IR to place a pigtail catheter under CT guidance. Check platelet function with P2 Y 12 assay Zosyn and vancomycin  LOS: 1 day    Tharon Aquas Trigt III 04/04/2016

## 2016-04-05 ENCOUNTER — Inpatient Hospital Stay (HOSPITAL_COMMUNITY): Payer: Medicare Other

## 2016-04-05 ENCOUNTER — Encounter (HOSPITAL_COMMUNITY): Payer: Self-pay | Admitting: General Surgery

## 2016-04-05 ENCOUNTER — Other Ambulatory Visit: Payer: Medicare Other

## 2016-04-05 ENCOUNTER — Other Ambulatory Visit: Payer: Self-pay | Admitting: Hematology and Oncology

## 2016-04-05 ENCOUNTER — Ambulatory Visit: Payer: Medicare Other | Admitting: Hematology and Oncology

## 2016-04-05 DIAGNOSIS — J90 Pleural effusion, not elsewhere classified: Secondary | ICD-10-CM

## 2016-04-05 DIAGNOSIS — J869 Pyothorax without fistula: Secondary | ICD-10-CM

## 2016-04-05 DIAGNOSIS — D46C Myelodysplastic syndrome with isolated del(5q) chromosomal abnormality: Secondary | ICD-10-CM

## 2016-04-05 LAB — URINE CULTURE

## 2016-04-05 LAB — CBC WITH DIFFERENTIAL/PLATELET
BASOS PCT: 0 %
Basophils Absolute: 0 10*3/uL (ref 0.0–0.1)
EOS PCT: 0 %
Eosinophils Absolute: 0 10*3/uL (ref 0.0–0.7)
HEMATOCRIT: 29.5 % — AB (ref 39.0–52.0)
Hemoglobin: 8.7 g/dL — ABNORMAL LOW (ref 13.0–17.0)
LYMPHS ABS: 1.5 10*3/uL (ref 0.7–4.0)
Lymphocytes Relative: 14 %
MCH: 23.3 pg — AB (ref 26.0–34.0)
MCHC: 29.5 g/dL — ABNORMAL LOW (ref 30.0–36.0)
MCV: 79.1 fL (ref 78.0–100.0)
MONO ABS: 2.1 10*3/uL — AB (ref 0.1–1.0)
MONOS PCT: 20 %
NEUTROS ABS: 7 10*3/uL (ref 1.7–7.7)
Neutrophils Relative %: 66 %
PLATELETS: 68 10*3/uL — AB (ref 150–400)
RBC: 3.73 MIL/uL — ABNORMAL LOW (ref 4.22–5.81)
RDW: 23.6 % — AB (ref 11.5–15.5)
WBC: 10.6 10*3/uL — ABNORMAL HIGH (ref 4.0–10.5)

## 2016-04-05 LAB — GLUCOSE, CAPILLARY
GLUCOSE-CAPILLARY: 102 mg/dL — AB (ref 65–99)
GLUCOSE-CAPILLARY: 89 mg/dL (ref 65–99)
GLUCOSE-CAPILLARY: 106 mg/dL — AB (ref 65–99)
Glucose-Capillary: 59 mg/dL — ABNORMAL LOW (ref 65–99)
Glucose-Capillary: 140 mg/dL — ABNORMAL HIGH (ref 65–99)

## 2016-04-05 LAB — COMPREHENSIVE METABOLIC PANEL
ALT: 14 U/L — AB (ref 17–63)
AST: 14 U/L — ABNORMAL LOW (ref 15–41)
Albumin: 2 g/dL — ABNORMAL LOW (ref 3.5–5.0)
Alkaline Phosphatase: 56 U/L (ref 38–126)
Anion gap: 8 (ref 5–15)
BILIRUBIN TOTAL: 0.5 mg/dL (ref 0.3–1.2)
BUN: 11 mg/dL (ref 6–20)
CHLORIDE: 105 mmol/L (ref 101–111)
CO2: 23 mmol/L (ref 22–32)
CREATININE: 1.46 mg/dL — AB (ref 0.61–1.24)
Calcium: 7.7 mg/dL — ABNORMAL LOW (ref 8.9–10.3)
GFR, EST AFRICAN AMERICAN: 49 mL/min — AB (ref >60)
GFR, EST NON AFRICAN AMERICAN: 43 mL/min — AB (ref >60)
Glucose, Bld: 115 mg/dL — ABNORMAL HIGH (ref 65–99)
POTASSIUM: 3.4 mmol/L — AB (ref 3.5–5.1)
Sodium: 136 mmol/L (ref 135–145)
TOTAL PROTEIN: 6.9 g/dL (ref 6.5–8.1)

## 2016-04-05 MED ORDER — LIDOCAINE HCL 1 % IJ SOLN
INTRAMUSCULAR | Status: AC
  Filled 2016-04-05: qty 20

## 2016-04-05 MED ORDER — MIDAZOLAM HCL 2 MG/2ML IJ SOLN
INTRAMUSCULAR | Status: AC
  Filled 2016-04-05: qty 2

## 2016-04-05 MED ORDER — POTASSIUM CHLORIDE 10 MEQ/100ML IV SOLN
10.0000 meq | INTRAVENOUS | Status: AC
  Administered 2016-04-05 (×2): 10 meq via INTRAVENOUS
  Filled 2016-04-05 (×2): qty 100

## 2016-04-05 MED ORDER — FENTANYL CITRATE (PF) 100 MCG/2ML IJ SOLN
INTRAMUSCULAR | Status: AC
  Filled 2016-04-05: qty 2

## 2016-04-05 MED ORDER — MIDAZOLAM HCL 2 MG/2ML IJ SOLN
INTRAMUSCULAR | Status: AC | PRN
  Administered 2016-04-05: 1 mg via INTRAVENOUS

## 2016-04-05 MED ORDER — FENTANYL CITRATE (PF) 100 MCG/2ML IJ SOLN
INTRAMUSCULAR | Status: AC | PRN
  Administered 2016-04-05: 25 ug via INTRAVENOUS

## 2016-04-05 NOTE — Sedation Documentation (Signed)
Patient is resting comfortably. 

## 2016-04-05 NOTE — Procedures (Signed)
Interventional Radiology Procedure Note  Procedure:  CT guided placement of left pleural drain  Complications: None  Estimated Blood Loss: < 10 mL  Findings:  12 Fr pigtail drain placed in more superior, loculated component of left pleural empyema.  Clear, yellowish fluid return.  Will connect drain to [place] at -20 cm water suction.  Venetia Night. Kathlene Cote, M.D Pager:  [phone]

## 2016-04-05 NOTE — Consult Note (Signed)
Chief Complaint: left empyema  Referring Physician:Dr. Tharon Aquas Trigt  Supervising Physician: Aletta Edouard  Patient Status: In-pt   HPI: Ricardo Watkins is an 82 y.o. male who has been followed by [name] for several months now secondary to a recurrent nonmalignant pleural effusion. The patient does have a history of lymphoma. He had a Pleurx catheter placed in August of this year secondary to this recurrent left-sided pleural effusion. Recently, this was noted to be loculated and a have evidence of infection. He has been admitted for antibiotic therapy. After review [name] felt like a VATS would be higher risk secondary to his age. Therefore he has recommended a placement of a CT-guided chest tube to help evacuate this fluid collection.  Past Medical History:  Past Medical History:  Diagnosis Date  . Adenomatous colon polyp   . CAD (coronary artery disease)    30% LAD Stenosis, 70% ramus intermedius stenosis, treated with PTCA and angioplasty by Dr Albertine Patricia 2004  . Cataract    right eye  . Cough, persistent 11/04/2015  . Deficiency anemia 04/19/2014  . Diverticulosis   . DM (diabetes mellitus) ([place])   . DVT (deep venous thrombosis) ([place])    secondary to surgery  . Dyslipidemia   . Headache    occ  . Hyperlipidemia   . Hypertension   . Inguinal hernia    right  . 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 Microcytic anemia 07/07/2015  . 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 Non Hodgkin's lymphoma ([place])   . Pleural effusion, left 11/04/2015  . Pneumonia   . 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 Prostate cancer ([place])    seed implants no recurrence  . PVD (peripheral vascular disease) ([place])    rt renal artery stent  . Renal insufficiency   . Shortness of breath dyspnea   . Thrombocytopenia ([place])   . 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 Vitamin D deficiency     Past Surgical History:  Past Surgical History:  Procedure Laterality Date  . APPENDECTOMY     patient ?  Marland Kitchen CARDIAC CATHETERIZATION    . CHEST TUBE INSERTION Left 02/10/2016   Procedure: INSERTION PLEURAL DRAINAGE CATHETER;  Surgeon: [name], MD;  Location: [facility];  Service: Thoracic;  Laterality: Left;  . COLONOSCOPY    . CORONARY ANGIOPLASTY    . CYSTOURETHROSCOPY     ROBOTIC ARM NUCLETRON SEED IMPLANTATION OF PROSTATE  . EXPLORATORY LAPAROTOMY     For evaluation of lymphoma    Family History:  Family History  Problem Relation Age of Onset  . Lymphoma Sister   . Prostate cancer Brother   . Breast cancer Sister   . Diabetes Brother   . Diabetes Sister   . Heart disease Brother   . Asthma Son     Social History:  reports that he quit smoking about 57 years ago. His smoking use included Pipe and Cigarettes. He has a 10.00 pack-year smoking history. He has never used smokeless tobacco. He reports that he does not drink alcohol or use drugs.  Allergies:  Allergies  Allergen Reactions  . Glucophage [Metformin Hydrochloride] Other (See Comments)    Chest pain  .  Zetia [Ezetimibe] Other (See Comments)    weakness  . Fenofibrate Rash  . Niacin-Lovastatin Er Rash    Medications: Medications have been reviewed in Epic.  Plavix has been held for over a week already.  Please HPI for pertinent positives, otherwise complete 10 system ROS negative.  Mallampati Score: MD Evaluation Airway: WNL Heart: WNL Abdomen: WNL Chest/ Lungs: WNL ASA  Classification: 3 Mallampati/Airway Score: Two  Physical Exam: BP (!) 141/63 (BP Location: Right Arm)   Pulse 93   Temp 97.9 F (36.6 C) (Oral)   Resp 18   Ht 5' 7"  (1.702 m)   Wt 188 lb 9.6 oz (85.5 kg)   SpO2 95%   BMI 29.54 kg/m  Body mass index is 29.54 kg/m. General: pleasant, elderly  white male who is laying in bed in NAD HEENT: head is normocephalic, atraumatic.  Sclera are noninjected.  PERRL.  Ears and nose without any masses or lesions.  Mouth is pink and moist Heart: regular, rate, and rhythm.  Normal s1,s2. No obvious murmurs, gallops, or rubs noted.  Palpable radial and pedal pulses bilaterally Lungs/chest: CTAB, but slightly diminished on left side.  Left pleurX catheter in place, no wheezes, rhonchi, or rales noted.  Respiratory effort nonlabored Abd: soft, NT, ND, +BS, no masses, hernias, or organomegaly Psych: A&Ox3 with an appropriate affect.   Labs: Results for orders placed or performed during the hospital encounter of 04/03/16 (from the past 48 hour(s))  Comprehensive metabolic panel     Status: Abnormal   Collection Time: 04/03/16  3:05 PM  Result Value Ref Range   Sodium 132 (L) 135 - 145 mmol/L   Potassium 3.9 3.5 - 5.1 mmol/L   Chloride 100 (L) 101 - 111 mmol/L   CO2 23 22 - 32 mmol/L   Glucose, Bld 199 (H) 65 - 99 mg/dL   BUN 11 6 - 20 mg/dL   Creatinine, Ser 1.60 (H) 0.61 - 1.24 mg/dL   Calcium 8.3 (L) 8.9 - 10.3 mg/dL   Total Protein 7.9 6.5 - 8.1 g/dL   Albumin 2.6 (L) 3.5 - 5.0 g/dL   AST 20 15 - 41 U/L   ALT 17 17 - 63 U/L   Alkaline Phosphatase 75 38 - 126 U/L   Total Bilirubin 0.3 0.3 - 1.2 mg/dL   GFR calc non Af Amer 38 (L) >60 mL/min   GFR calc Af Amer 44 (L) >60 mL/min    Comment: (NOTE) The eGFR has been calculated using the CKD EPI equation. This calculation has not been validated in all clinical situations. eGFR's persistently <60 mL/min signify possible Chronic Kidney Disease.    Anion gap 9 5 - 15  CBC with Differential     Status: Abnormal   Collection Time: 04/03/16  3:05 PM  Result Value Ref Range   WBC 12.9 (H) 4.0 - 10.5 K/uL    Comment: WHITE COUNT CONFIRMED ON SMEAR   RBC 4.38 4.22 - 5.81 MIL/uL   Hemoglobin 10.5 (L) 13.0 - 17.0 g/dL   HCT 34.2 (L) 39.0 - 52.0 %   MCV 78.1 78.0 - 100.0 fL   MCH 24.0 (L) 26.0  - 34.0 pg   MCHC 30.7 30.0 - 36.0 g/dL   RDW 23.3 (H) 11.5 - 15.5 %   Platelets 96 (L) 150 - 400 K/uL    Comment: REPEATED TO VERIFY SPECIMEN CHECKED FOR CLOTS PLATELET COUNT CONFIRMED BY SMEAR    Neutrophils Relative % 67 %   Lymphocytes Relative 16 %  Monocytes Relative 17 %   Eosinophils Relative 0 %   Basophils Relative 0 %   Neutro Abs 8.6 (H) 1.7 - 7.7 K/uL   Lymphs Abs 2.1 0.7 - 4.0 K/uL   Monocytes Absolute 2.2 (H) 0.1 - 1.0 K/uL   Eosinophils Absolute 0.0 0.0 - 0.7 K/uL   Basophils Absolute 0.0 0.0 - 0.1 K/uL   RBC Morphology ELLIPTOCYTES     Comment: SCHISTOCYTES NOTED ON SMEAR   Smear Review LARGE PLATELETS PRESENT   I-Stat CG4 Lactic Acid, ED     Status: None   Collection Time: 04/03/16  3:25 PM  Result Value Ref Range   Lactic Acid, Venous 1.88 0.5 - 1.9 mmol/L  Blood Culture (routine x 2)     Status: None (Preliminary result)   Collection Time: 04/03/16  7:01 PM  Result Value Ref Range   Specimen Description LEFT ANTECUBITAL    Special Requests BOTTLES DRAWN AEROBIC AND ANAEROBIC 5CC    Culture NO GROWTH < 24 HOURS    Report Status PENDING   Blood Culture (routine x 2)     Status: None (Preliminary result)   Collection Time: 04/03/16  7:06 PM  Result Value Ref Range   Specimen Description RIGHT ANTECUBITAL    Special Requests BOTTLES DRAWN AEROBIC AND ANAEROBIC 5CC    Culture NO GROWTH < 24 HOURS    Report Status PENDING   Protime-INR     Status: None   Collection Time: 04/03/16  7:10 PM  Result Value Ref Range   Prothrombin Time 15.1 11.4 - 15.2 seconds   INR 1.19   APTT     Status: None   Collection Time: 04/03/16  7:10 PM  Result Value Ref Range   aPTT 35 24 - 36 seconds  I-Stat CG4 Lactic Acid, ED     Status: None   Collection Time: 04/03/16  7:24 PM  Result Value Ref Range   Lactic Acid, Venous 1.15 0.5 - 1.9 mmol/L  Urinalysis, Routine w reflex microscopic     Status: Abnormal   Collection Time: 04/03/16  8:07 PM  Result Value Ref Range    Color, Urine AMBER (A) YELLOW    Comment: BIOCHEMICALS MAY BE AFFECTED BY COLOR   APPearance CLOUDY (A) CLEAR   Specific Gravity, Urine 1.022 1.005 - 1.030   pH 5.0 5.0 - 8.0   Glucose, UA NEGATIVE NEGATIVE mg/dL   Hgb urine dipstick SMALL (A) NEGATIVE   Bilirubin Urine NEGATIVE NEGATIVE   Ketones, ur NEGATIVE NEGATIVE mg/dL   Protein, ur 100 (A) NEGATIVE mg/dL   Nitrite NEGATIVE NEGATIVE   Leukocytes, UA NEGATIVE NEGATIVE  Urine microscopic-add on     Status: Abnormal   Collection Time: 04/03/16  8:07 PM  Result Value Ref Range   Squamous Epithelial / LPF 0-5 (A) NONE SEEN   WBC, UA 0-5 0 - 5 WBC/hpf   RBC / HPF 0-5 0 - 5 RBC/hpf   Bacteria, UA RARE (A) NONE SEEN   Casts GRANULAR CAST (A) NEGATIVE  CBG monitoring, ED     Status: Abnormal   Collection Time: 04/04/16 12:26 AM  Result Value Ref Range   Glucose-Capillary 141 (H) 65 - 99 mg/dL  Comprehensive metabolic panel     Status: Abnormal   Collection Time: 04/04/16  1:24 AM  Result Value Ref Range   Sodium 136 135 - 145 mmol/L   Potassium 3.9 3.5 - 5.1 mmol/L   Chloride 107 101 - 111 mmol/L   CO2 21 (  L) 22 - 32 mmol/L   Glucose, Bld 150 (H) 65 - 99 mg/dL   BUN 13 6 - 20 mg/dL   Creatinine, Ser 1.45 (H) 0.61 - 1.24 mg/dL   Calcium 7.5 (L) 8.9 - 10.3 mg/dL   Total Protein 6.8 6.5 - 8.1 g/dL   Albumin 2.1 (L) 3.5 - 5.0 g/dL   AST 14 (L) 15 - 41 U/L   ALT 14 (L) 17 - 63 U/L   Alkaline Phosphatase 58 38 - 126 U/L   Total Bilirubin 0.3 0.3 - 1.2 mg/dL   GFR calc non Af Amer 43 (L) >60 mL/min   GFR calc Af Amer 50 (L) >60 mL/min    Comment: (NOTE) The eGFR has been calculated using the CKD EPI equation. This calculation has not been validated in all clinical situations. eGFR's persistently <60 mL/min signify possible Chronic Kidney Disease.    Anion gap 8 5 - 15  CBC WITH DIFFERENTIAL     Status: Abnormal   Collection Time: 04/04/16  4:05 AM  Result Value Ref Range   WBC 13.5 (H) 4.0 - 10.5 K/uL    Comment: WHITE  COUNT CONFIRMED ON SMEAR   RBC 3.83 (L) 4.22 - 5.81 MIL/uL   Hemoglobin 9.0 (L) 13.0 - 17.0 g/dL   HCT 30.3 (L) 39.0 - 52.0 %   MCV 79.1 78.0 - 100.0 fL   MCH 23.5 (L) 26.0 - 34.0 pg   MCHC 29.7 (L) 30.0 - 36.0 g/dL   RDW 23.5 (H) 11.5 - 15.5 %   Platelets 74 (L) 150 - 400 K/uL    Comment: PLATELET COUNT CONFIRMED BY SMEAR   Neutrophils Relative % 60 %   Lymphocytes Relative 18 %   Monocytes Relative 22 %   Eosinophils Relative 0 %   Basophils Relative 0 %   Neutro Abs 8.1 (H) 1.7 - 7.7 K/uL   Lymphs Abs 2.4 0.7 - 4.0 K/uL   Monocytes Absolute 3.0 (H) 0.1 - 1.0 K/uL   Eosinophils Absolute 0.0 0.0 - 0.7 K/uL   Basophils Absolute 0.0 0.0 - 0.1 K/uL   RBC Morphology POLYCHROMASIA PRESENT     Comment: SCHISTOCYTES NOTED ON SMEAR ELLIPTOCYTES   Comprehensive metabolic panel     Status: Abnormal   Collection Time: 04/04/16  4:05 AM  Result Value Ref Range   Sodium 136 135 - 145 mmol/L   Potassium 3.7 3.5 - 5.1 mmol/L   Chloride 107 101 - 111 mmol/L   CO2 21 (L) 22 - 32 mmol/L   Glucose, Bld 165 (H) 65 - 99 mg/dL   BUN 13 6 - 20 mg/dL   Creatinine, Ser 1.47 (H) 0.61 - 1.24 mg/dL   Calcium 7.6 (L) 8.9 - 10.3 mg/dL   Total Protein 6.6 6.5 - 8.1 g/dL   Albumin 2.1 (L) 3.5 - 5.0 g/dL   AST 14 (L) 15 - 41 U/L   ALT 15 (L) 17 - 63 U/L   Alkaline Phosphatase 58 38 - 126 U/L   Total Bilirubin 0.5 0.3 - 1.2 mg/dL   GFR calc non Af Amer 42 (L) >60 mL/min   GFR calc Af Amer 49 (L) >60 mL/min    Comment: (NOTE) The eGFR has been calculated using the CKD EPI equation. This calculation has not been validated in all clinical situations. eGFR's persistently <60 mL/min signify possible Chronic Kidney Disease.    Anion gap 8 5 - 15  CBG monitoring, ED     Status: Abnormal  Collection Time: 04/04/16  8:26 AM  Result Value Ref Range   Glucose-Capillary 127 (H) 65 - 99 mg/dL  Platelet inhibition p2y12 (not at [facility])     Status: None   Collection Time: 04/04/16 10:19 AM  Result Value Ref  Range   Platelet Function  P2Y12 205 194 - 418 PRU  Glucose, capillary     Status: Abnormal   Collection Time: 04/04/16 11:28 AM  Result Value Ref Range   Glucose-Capillary 123 (H) 65 - 99 mg/dL   Comment 1 Notify RN    Comment 2 Document in Chart   Glucose, capillary     Status: Abnormal   Collection Time: 04/04/16  4:39 PM  Result Value Ref Range   Glucose-Capillary 145 (H) 65 - 99 mg/dL   Comment 1 Notify RN    Comment 2 Document in Chart   Glucose, capillary     Status: Abnormal   Collection Time: 04/04/16  9:55 PM  Result Value Ref Range   Glucose-Capillary 126 (H) 65 - 99 mg/dL  CBC WITH DIFFERENTIAL     Status: Abnormal   Collection Time: 04/05/16  2:09 AM  Result Value Ref Range   WBC 10.6 (H) 4.0 - 10.5 K/uL    Comment: WHITE COUNT CONFIRMED ON SMEAR   RBC 3.73 (L) 4.22 - 5.81 MIL/uL   Hemoglobin 8.7 (L) 13.0 - 17.0 g/dL   HCT 29.5 (L) 39.0 - 52.0 %   MCV 79.1 78.0 - 100.0 fL   MCH 23.3 (L) 26.0 - 34.0 pg   MCHC 29.5 (L) 30.0 - 36.0 g/dL   RDW 23.6 (H) 11.5 - 15.5 %   Platelets 68 (L) 150 - 400 K/uL    Comment: PLATELET COUNT CONFIRMED BY SMEAR   Neutrophils Relative % 66 %   Lymphocytes Relative 14 %   Monocytes Relative 20 %   Eosinophils Relative 0 %   Basophils Relative 0 %   Neutro Abs 7.0 1.7 - 7.7 K/uL   Lymphs Abs 1.5 0.7 - 4.0 K/uL   Monocytes Absolute 2.1 (H) 0.1 - 1.0 K/uL   Eosinophils Absolute 0.0 0.0 - 0.7 K/uL   Basophils Absolute 0.0 0.0 - 0.1 K/uL   RBC Morphology POLYCHROMASIA PRESENT     Comment: SCHISTOCYTES NOTED ON SMEAR ELLIPTOCYTES   Comprehensive metabolic panel     Status: Abnormal   Collection Time: 04/05/16  2:09 AM  Result Value Ref Range   Sodium 136 135 - 145 mmol/L   Potassium 3.4 (L) 3.5 - 5.1 mmol/L   Chloride 105 101 - 111 mmol/L   CO2 23 22 - 32 mmol/L   Glucose, Bld 115 (H) 65 - 99 mg/dL   BUN 11 6 - 20 mg/dL   Creatinine, Ser 1.46 (H) 0.61 - 1.24 mg/dL   Calcium 7.7 (L) 8.9 - 10.3 mg/dL   Total Protein 6.9 6.5 -  8.1 g/dL   Albumin 2.0 (L) 3.5 - 5.0 g/dL   AST 14 (L) 15 - 41 U/L   ALT 14 (L) 17 - 63 U/L   Alkaline Phosphatase 56 38 - 126 U/L   Total Bilirubin 0.5 0.3 - 1.2 mg/dL   GFR calc non Af Amer 43 (L) >60 mL/min   GFR calc Af Amer 49 (L) >60 mL/min    Comment: (NOTE) The eGFR has been calculated using the CKD EPI equation. This calculation has not been validated in all clinical situations. eGFR's persistently <60 mL/min signify possible Chronic Kidney Disease.  Anion gap 8 5 - 15    Imaging: Dg Chest 2 View  Result Date: 04/03/2016 CLINICAL DATA:  Sepsis.  Concern for empyema EXAM: CHEST  2 VIEW COMPARISON:  03/03/2016 FINDINGS: Loculated left pleural effusion, moderate size, thickest at the base. There is a tunneled pleural catheter at the left base with alignment that is stable from 04/02/2016. The underlying lung is partly atelectatic. Normal heart size. The lower aorta is obscured by a medial pleural disease. IMPRESSION: Moderate loculated left pleural effusion with tunneled pleural catheter. Appearance is stable compared to 04/02/2016 PET-CT. Electronically Signed   By: Monte Fantasia M.D.   On: 04/03/2016 20:58   Ct Chest W Contrast  Result Date: 04/03/2016 CLINICAL DATA:  82 year old male with fever. Patient with left pleural drainage catheter. History of lymphoma. EXAM: CT CHEST WITH CONTRAST TECHNIQUE: Multidetector CT imaging of the chest was performed during intravenous contrast administration. CONTRAST:  66m ISOVUE-300 IOPAMIDOL (ISOVUE-300) INJECTION 61% COMPARISON:  04/02/2016 PET-CT and prior studies. FINDINGS: Cardiovascular: Upper limits normal heart size and moderate coronary artery calcifications noted. There is no evidence of thoracic aortic aneurysm. Mediastinum/Nodes: Multiple shotty and enlarged mediastinal lymph nodes are identified, the largest in the lower thoracic para-aortic region. These mediastinal lymph nodes for mildly hypermetabolic on recent PET study  suspicious for recurrent lymphoma. There is no evidence of pericardial effusion. Lungs/Pleura: A moderate loculated left pleural effusion is again identified. A left pleural catheter with tip in the posterior-inferior left pleural space again noted. Left lower lung atelectasis identified. Mild interlobular septal thickening is noted within both lungs. No discrete mass identified. There is no evidence of pneumothorax or right pleural effusion. Upper Abdomen: No acute abnormality. Upper limits normal spleen size noted. Musculoskeletal: No acute or suspicious abnormality. Degenerative disc disease in the lower cervical spine identified. IMPRESSION: Moderate loculated left pleural effusion, with left pleural catheter tip in the posterior inferior left pleural space. Associated left lower lung atelectasis. Mediastinal lymph nodes as described, mildly hypermetabolic on recent PET study suspicious for recurrent lymphoma. Coronary artery disease. Electronically Signed   By: JMargarette CanadaM.D.   On: 04/03/2016 21:47   Dg Chest Port 1 View  Result Date: 04/05/2016 CLINICAL DATA:  Preprocedural evaluation for pleural drainage catheter. EXAM: PORTABLE CHEST 1 VIEW COMPARISON:  CT 04/03/2016.  Chest x-ray 04/03/2016 . FINDINGS: Progressive left pleural effusion noted. Some degree of loculation may be present. Mild cardiomegaly with mild interstitial prominence, a component of mild congestive heart failure cannot be excluded. No pneumothorax . IMPRESSION: 1. Progressive left pleural effusion with possible loculation. 2. Cardiomegaly with mild interstitial prominence. A component of mild congestive heart failure cannot be excluded . Electronically Signed   By: TMarcello Moores Register   On: 04/05/2016 08:01    Assessment/Plan 1. Left empyema -We will plan to proceed with CT-guided pigtail chest tube catheter placement today. -Labs and vital signs have been reviewed. -Imaging has been reviewed with Dr. YKathlene Cote-Risks and  Benefits discussed with the patient including bleeding, infection, damage to adjacent structures, and sepsis. All of the patient's questions were answered, patient is agreeable to proceed. Consent signed and in chart.    Thank you for this interesting consult.  I greatly enjoyed meeting DDover Corporationand look forward to participating in their care.  A copy of this report was sent to the requesting provider on this date.  Electronically Signed: OHenreitta Cea10/16/2017, 10:07 AM   I spent a total of 40 Minutes    in  face to face in clinical consultation, greater than 50% of which was counseling/coordinating care for left empyema

## 2016-04-05 NOTE — Progress Notes (Signed)
Ricardo Watkins   DOB:04/18/1931   [id number]    Subjective: The patient missed his appointment today. Noted that he is admitted to the hospital for management of possible empyema. Chest tube was placed The patient denies any recent signs or symptoms of bleeding such as spontaneous epistaxis, hematuria or hematochezia.   Objective:  Vitals:   04/05/16 1426 04/05/16 1433  BP: 128/64 (!) 147/76  Pulse: 99 98  Resp: (!) 24 (!) 27  Temp:       Intake/Output Summary (Last 24 hours) at 04/05/16 1630 Last data filed at 04/05/16 1500  Gross per 24 hour  Intake                0 ml  Output             1700 ml  Net            -1700 ml    GENERAL:alert, no distress and comfortable SKIN: skin color Is pale, texture, turgor are normal, no rashes or significant lesions EYES: normal, Conjunctiva are pale and non-injected, sclera clear Musculoskeletal:no cyanosis of digits and no clubbing  NEURO: alert & oriented x 3 with fluent speech, no focal motor/sensory deficits   Labs:  Lab Results  Component Value Date   WBC 10.6 (H) 04/05/2016   HGB 8.7 (L) 04/05/2016   HCT 29.5 (L) 04/05/2016   MCV 79.1 04/05/2016   PLT 68 (L) 04/05/2016   NEUTROABS 7.0 04/05/2016    Lab Results  Component Value Date   NA 136 04/05/2016   K 3.4 (L) 04/05/2016   CL 105 04/05/2016   CO2 23 04/05/2016    Studies: I reviewed the PET CT scan with family Dg Chest 2 View  Result Date: 04/03/2016 CLINICAL DATA:  Sepsis.  Concern for empyema EXAM: CHEST  2 VIEW COMPARISON:  03/03/2016 FINDINGS: Loculated left pleural effusion, moderate size, thickest at the base. There is a tunneled pleural catheter at the left base with alignment that is stable from 04/02/2016. The underlying lung is partly atelectatic. Normal heart size. The lower aorta is obscured by a medial pleural disease. IMPRESSION: Moderate loculated left pleural effusion with tunneled pleural catheter. Appearance is stable compared to 04/02/2016 PET-CT.  Electronically Signed   By: Monte Fantasia M.D.   On: 04/03/2016 20:58   Ct Chest W Contrast  Result Date: 04/03/2016 CLINICAL DATA:  82 year old male with fever. Patient with left pleural drainage catheter. History of lymphoma. EXAM: CT CHEST WITH CONTRAST TECHNIQUE: Multidetector CT imaging of the chest was performed during intravenous contrast administration. CONTRAST:  31mL ISOVUE-300 IOPAMIDOL (ISOVUE-300) INJECTION 61% COMPARISON:  04/02/2016 PET-CT and prior studies. FINDINGS: Cardiovascular: Upper limits normal heart size and moderate coronary artery calcifications noted. There is no evidence of thoracic aortic aneurysm. Mediastinum/Nodes: Multiple shotty and enlarged mediastinal lymph nodes are identified, the largest in the lower thoracic para-aortic region. These mediastinal lymph nodes for mildly hypermetabolic on recent PET study suspicious for recurrent lymphoma. There is no evidence of pericardial effusion. Lungs/Pleura: A moderate loculated left pleural effusion is again identified. A left pleural catheter with tip in the posterior-inferior left pleural space again noted. Left lower lung atelectasis identified. Mild interlobular septal thickening is noted within both lungs. No discrete mass identified. There is no evidence of pneumothorax or right pleural effusion. Upper Abdomen: No acute abnormality. Upper limits normal spleen size noted. Musculoskeletal: No acute or suspicious abnormality. Degenerative disc disease in the lower cervical spine identified. IMPRESSION: Moderate loculated left pleural  effusion, with left pleural catheter tip in the posterior inferior left pleural space. Associated left lower lung atelectasis. Mediastinal lymph nodes as described, mildly hypermetabolic on recent PET study suspicious for recurrent lymphoma. Coronary artery disease. Electronically Signed   By: Margarette Canada M.D.   On: 04/03/2016 21:47   Dg Chest Port 1 View  Result Date: 04/05/2016 CLINICAL DATA:   Preprocedural evaluation for pleural drainage catheter. EXAM: PORTABLE CHEST 1 VIEW COMPARISON:  CT 04/03/2016.  Chest x-ray 04/03/2016 . FINDINGS: Progressive left pleural effusion noted. Some degree of loculation may be present. Mild cardiomegaly with mild interstitial prominence, a component of mild congestive heart failure cannot be excluded. No pneumothorax . IMPRESSION: 1. Progressive left pleural effusion with possible loculation. 2. Cardiomegaly with mild interstitial prominence. A component of mild congestive heart failure cannot be excluded . Electronically Signed   By: Marcello Moores  Register   On: 04/05/2016 08:01   Ct Image Guided Fluid Drain By Catheter  Result Date: 04/05/2016 CLINICAL DATA:  Lymphoma, and left-sided empyema with residual loculated left pleural fluid remaining. Request has been made to place a percutaneous pleural drainage catheter. EXAM: CT GUIDED LEFT THORACOSTOMY TUBE PLACEMENT FOR EMPYEMA ANESTHESIA/SEDATION: 1.0 mg IV Versed 25 mcg IV Fentanyl Total Moderate Sedation Time:  20 minutes The patient's level of consciousness and physiologic status were continuously monitored during the procedure by Radiology nursing. PROCEDURE: The procedure, risks, benefits, and alternatives were explained to the patient's wife. Questions regarding the procedure were encouraged and answered. The patient's wife understands and consents to the procedure. A time out was performed prior to initiating the procedure. The left upper chest wall was prepped with chlorhexidine in a sterile fashion, and a sterile drape was applied covering the operative field. A sterile gown and sterile gloves were used for the procedure. Local anesthesia was provided with 1% Lidocaine. CT was performed in a supine position. After localizing the most appropriate target for pleural drainage, an 18 gauge trocar needle was advanced under CT guidance into the left pleural space. Fluid was aspirated. A guidewire was then advanced  through the needle and the needle removed. The percutaneous tract was dilated over the guidewire. A 12 French pigtail drainage catheter was then advanced over the wire. After the catheter was formed, additional CT images were performed. The catheter was then connected to a [place] Pleur-Evac device. COMPLICATIONS: None FINDINGS: The largest loculation of residual left pleural fluid is in the lateral superior hemithorax. Aspiration yielded clear, yellowish fluid. Thorax. After pleural drain placement, there is return of yellowish colored clear fluid. IMPRESSION: CT-guided left chest thoracostomy tube placement with placement of 12 French drain into the left pleural space. The site of loculated fluid in the superior and lateral aspect of the hemithorax was targeted for catheter placement. Electronically Signed   By: Aletta Edouard M.D.   On: 04/05/2016 15:54    Assessment & Plan:   MDS (myelodysplastic syndrome) with 5q deletion The patient had drastic change in his CBC with significant acute worsening pancytopenia likely due to increased consumption from minor bleeding and stress reaction There is no contraindication for him to proceed with surgery if indicated or biopsy as long as blood count is greater than 50,000 He may need perioperative platelet transfusion For anemia, he does not require blood transfusion.  I plan to increased Promacta to 75mg  and reassess in his next visit In the meantime, I recommend stopping Plavix  Pleural effusion, left with empyema The cause of pleural effusion is unknown. PET/CT  scan and CT imaging show mediastinal lymphadenopathy, but that could be reactive in the presence of empyema. So far, fluid from pleural space was negative for malignancy. I will get his case presented at the hematology tumor board tomorrow  Thrombocytopenia ([place]) His platelet count is severely reduced He does not need platelet transfusion today I recommend close monitoring. I have  discontinued Plavix. Continue Promacta He will need prophylactic platelet transfusion for surgery or if he has signs of bleeding or platelet count less than 10,000  Thyroid nodule This appears to be benign or at least not hypermetabolic on recent PET  Discharge planning Defer to primary service I told the family to call me after he is discharged to arrange follow-up outpatient appointment Please call if questions arise   Heath Lark, MD 04/05/2016  4:30 PM

## 2016-04-05 NOTE — Progress Notes (Signed)
PROGRESS NOTE    Ricardo Watkins  VWU:[id number] DOB: Jun 13, 1931 DOA: 04/03/2016 PCP: [name], MD  Brief Narrative:  Ricardo Watkins is a 82 y.o. male with medical history significant of non-Hodgkin's lymphoma, CAD, type 2 diabetes, hyperlipidemia, hypertension, anemia, prostate CA, diverticulosis, cataracts, DVT, chronic cough, chronic left pleural effusion with insertion of left pleural catheter on 11/10/2015 by [name] who comes to the hospital due to fever, chills, worsening fatigue and malaise. Per patient, his daughter and wife, his chest tube drainage has been decreasing his output for the past several days and he stopped completely yesterday. He normally has up to 500 mL of drainage daily. Patient was worked up and found to be septic and have a loculated effusion. CT Surgery was consulted and is to undergo a Pigtail Catheter Drain by IR this Am.   Assessment & Plan:   Principal Problem:   Empyema ([place]) Active Problems:   CAD (coronary artery disease)   History of lymphoma   Essential hypertension   Thrombocytopenia (H[facility])   Microcytic anemia   Type 2 diabetes mellitus (H[facility])  Sepsis 2/2 to Loculated Effusion/Empyema from Left Obstructed Pleural Catheter for recurrent and chronic Non Malignant Pleural Effusions s/p CT Guided Placement of Left 12 French Pigtail Pleural Drain, improving -Sepsis resolved -Patient was Febrileat 102.3 when he came in and Bolused 2600 mL of NS in ED -C/w IV Vancomycin and IV Zosyn -WBC was 12.9 on admission and increased to 13.5 and now is 10.6; Repeat CBC in AM -PET-CT Scan showed Moderate-sized loculated left pleural effusion collection with a PleurX drainage catheter in place. -[name] of Cardiothoracic Surgery Saw patient and will evaluated; IR placed CT Guided Fluid Drain by PigTail Catheter; Will Need Fluid Cx -Continue Supplemental Oxygen. -May need VATS procedure -Analgesics as needed. -C/w NS at 75 mL/hr for now  Hx of  Non-Hodgkins Lymphoma -PET-CT Scan showed Enlarging hypermetabolic mediastinal lymph nodes suggesting recurrent lymphoma. New nodules along the left major fissure and in the left upper lobe subpleural space worrisome for lymphomatous involvement. 5 mm pretracheal lymph node on image number 87 has an SUV max of 2.56. Multiple small prevascular lymph nodes. The largest node measures 5 mm on image number 100 and SUV max is 2.46. 11 mm aorticopulmonary window lymph node on image number 94 has an SUV max of 3.5. Subcarinal lymph node on image number 109 measures 10 mm and has an SUV max of 3.5. Left-sided retrocrural lymph node measures 23 x 16 mm on image number 127 and has SUV max of 5.9. Right-sided periaortic lymph node on image number 121 measures 8.5 mm and SUV max is 4.1.  -Patient was seen by Hematologist/Oncologist [name] -Continue scheduled follow-ups with Hematology/Oncology after empyema treatment.  MDS (myelodysplastic syndrome) with 5q deletion -Followed by Hematology/Oncology; Seen by [name] today -D/C'd Plavix -Continue Promacta.  Patient's Hematologist increased it to 75 mg and will reassess next visit.  Hypokalemia -Patient's K+ was 3.4 -Repleted IV Kcl 10 mEQ x 2 -Repeat BMP in AM  Insulin Dependent Type 2 Diabetes Mellitus  -BS was on BMP -CBG's have been running from 59-106 -Last Hemoglobin A1c was 6.7% in July. -Continue Lantus 15 units SQ daily. -CBG monitoring and Moderate SSI Novolog Insulin  CAD  -Continue Metoprolol 100 mg by mouth daily and Irbesartan 150 mg po daily -Clopidogrel was helped by Hematology/Oncology because of Thrombocytopenia.  Essential Hypertension -Continue Metoprolol 100 mg po daily and Irbesartan 150 mg po daily. -  Monitor blood pressure, BUN/creatinine and electrolytes.  Thrombocytopenia  -Platelet Count was 68 down from 74K -SCDs for DVT prophylaxis. -Continue Promacta.  Patient's Hematologist increased it to 75 mg and will  reassess next visit. -Hematology recommended stopping Clopidogrel -Follow-up with Hematology as scheduled. -Monitor platelet levels by repeat CBC in AM.  Microcytic Anemia -C/w Ferrous Sulfate 325 mg tablet po  -Hb/Hct went from 9.0/30.3 -> 8.7/29.5 -Repeat CBC in AM  DVT prophylaxis: SCDs Code Status: FULL Family Communication: Discussed Plan of Care with Wife and daughter-in-law. Disposition Plan: Home vs. Home Health.  Consultants:   Cardiothoracic Surgery [name]  Intervential Radiology Dr. Aletta Edouard  Hematology/Oncology Dr. Heath Lark  Procedures: CT Guided IR Pigtail Cathether Drain  Antimicrobials: IV Vanc and Zosyn  Subjective: Patient seen and examined bedside and states he is breathing better. Is ready to eat. Patient to undergo CT Guided Drain placement today. No other complaints or concerns. Doing better.  Objective: Vitals:   04/05/16 1418 04/05/16 1421 04/05/16 1426 04/05/16 1433  BP: (!) 150/69 (!) 147/71 128/64 (!) 147/76  Pulse: (!) 103 (!) 106 99 98  Resp: (!) 27 (!) 21 (!) 24 (!) 27  Temp:      TempSrc:      SpO2: 93% 90% (!) 89% 91%  Weight:      Height:        Intake/Output Summary (Last 24 hours) at 04/05/16 1447 Last data filed at 04/05/16 1100  Gross per 24 hour  Intake                0 ml  Output             1400 ml  Net            -1400 ml   Filed Weights   04/03/16 1946 04/03/16 2026 04/04/16 1000  Weight: 85.3 kg (188 lb) 85.3 kg (188 lb) 85.5 kg (188 lb 9.6 oz)   Examination: Physical Exam:  Constitutional: Elderly Male calm and comfortable Eyes:  Lids and conjunctivae normal, sclerae anicteric  ENMT: External Ears, Nose appear normal. Grossly normal hearing.  Neck: Appears normal, supple, no cervical masses, normal ROM, no appreciable thyromegaly, no JVD Respiratory: Diminished Breath sounds on Left. No wheezing, rales, rhonchi or crackles. Normal respiratory effort and patient is not tachypenic. No accessory  muscle use.  Cardiovascular: RRR, no murmurs / rubs / gallops. S1 and S2 auscultated. No extremity edema. 2+ pedal pulses. No carotid bruits.  Abdomen: Soft, non-tender, non-distended. No masses palpated. No appreciable hepatosplenomegaly. Bowel sounds positive.  GU: Deferred. Musculoskeletal: No clubbing / cyanosis of digits/nails. No joint deformity upper and lower extremities.  Skin: No rashes, lesions, ulcers. No induration; Warm and dry. Pleur-X Catheter bandaged. Neurologic: CN 2-12 grossly intact with no focal deficits. Sensation intact in all 4 Extremities. Romberg sign cerebellar reflexes not assessed.  Psychiatric: Normal judgment and insight. Alert and oriented x 3. Normal mood and appropriate affect.   Data Reviewed: I have personally reviewed following labs and imaging studies  CBC:  Recent Labs Lab 04/03/16 1505 04/04/16 0405 04/05/16 0209  WBC 12.9* 13.5* 10.6*  NEUTROABS 8.6* 8.1* 7.0  HGB 10.5* 9.0* 8.7*  HCT 34.2* 30.3* 29.5*  MCV 78.1 79.1 79.1  PLT 96* 74* 68*   Basic Metabolic Panel:  Recent Labs Lab 04/03/16 1505 04/04/16 0124 04/04/16 0405 04/05/16 0209  NA 132* 136 136 136  K 3.9 3.9 3.7 3.4*  CL 100* 107 107 105  CO2  23 21* 21* 23  GLUCOSE 199* 150* 165* 115*  BUN 11 13 13 11   CREATININE 1.60* 1.45* 1.47* 1.46*  CALCIUM 8.3* 7.5* 7.6* 7.7*   GFR: Estimated Creatinine Clearance: 40.1 mL/min (by C-G formula based on SCr of 1.46 mg/dL (H)). Liver Function Tests:  Recent Labs Lab 04/03/16 1505 04/04/16 0124 04/04/16 0405 04/05/16 0209  AST 20 14* 14* 14*  ALT 17 14* 15* 14*  ALKPHOS 75 58 58 56  BILITOT 0.3 0.3 0.5 0.5  PROT 7.9 6.8 6.6 6.9  ALBUMIN 2.6* 2.1* 2.1* 2.0*   No results for input(s): LIPASE, AMYLASE in the last 168 hours. No results for input(s): AMMONIA in the last 168 hours. Coagulation Profile:  Recent Labs Lab 04/03/16 1910  INR 1.19   Cardiac Enzymes: No results for input(s): CKTOTAL, CKMB, CKMBINDEX, TROPONINI  in the last 168 hours. BNP (last 3 results)  Recent Labs  01/27/16 1119  PROBNP 311.0*   HbA1C: No results for input(s): HGBA1C in the last 72 hours. CBG:  Recent Labs Lab 04/04/16 1128 04/04/16 1639 04/04/16 2155 04/05/16 0618 04/05/16 1117  GLUCAP 123* 145* 126* 102* 89   Lipid Profile: No results for input(s): CHOL, HDL, LDLCALC, TRIG, CHOLHDL, LDLDIRECT in the last 72 hours. Thyroid Function Tests: No results for input(s): TSH, T4TOTAL, FREET4, T3FREE, THYROIDAB in the last 72 hours. Anemia Panel: No results for input(s): VITAMINB12, FOLATE, FERRITIN, TIBC, IRON, RETICCTPCT in the last 72 hours. Sepsis Labs:  Recent Labs Lab 04/03/16 1525 04/03/16 1924  LATICACIDVEN 1.88 1.15    Recent Results (from the past 240 hour(s))  Blood Culture (routine x 2)     Status: None (Preliminary result)   Collection Time: 04/03/16  7:01 PM  Result Value Ref Range Status   Specimen Description LEFT ANTECUBITAL  Final   Special Requests BOTTLES DRAWN AEROBIC AND ANAEROBIC 5CC  Final   Culture NO GROWTH < 24 HOURS  Final   Report Status PENDING  Incomplete  Blood Culture (routine x 2)     Status: None (Preliminary result)   Collection Time: 04/03/16  7:06 PM  Result Value Ref Range Status   Specimen Description RIGHT ANTECUBITAL  Final   Special Requests BOTTLES DRAWN AEROBIC AND ANAEROBIC 5CC  Final   Culture NO GROWTH < 24 HOURS  Final   Report Status PENDING  Incomplete  Urine culture     Status: Abnormal   Collection Time: 04/03/16  8:07 PM  Result Value Ref Range Status   Specimen Description URINE, CLEAN CATCH  Final   Special Requests NONE  Final   Culture MULTIPLE SPECIES PRESENT, SUGGEST RECOLLECTION (A)  Final   Report Status 04/05/2016 FINAL  Final    Radiology Studies: Dg Chest 2 View  Result Date: 04/03/2016 CLINICAL DATA:  Sepsis.  Concern for empyema EXAM: CHEST  2 VIEW COMPARISON:  03/03/2016 FINDINGS: Loculated left pleural effusion, moderate size,  thickest at the base. There is a tunneled pleural catheter at the left base with alignment that is stable from 04/02/2016. The underlying lung is partly atelectatic. Normal heart size. The lower aorta is obscured by a medial pleural disease. IMPRESSION: Moderate loculated left pleural effusion with tunneled pleural catheter. Appearance is stable compared to 04/02/2016 PET-CT. Electronically Signed   By: Monte Fantasia M.D.   On: 04/03/2016 20:58   Ct Chest W Contrast  Result Date: 04/03/2016 CLINICAL DATA:  82 year old male with fever. Patient with left pleural drainage catheter. History of lymphoma. EXAM: CT CHEST WITH  CONTRAST TECHNIQUE: Multidetector CT imaging of the chest was performed during intravenous contrast administration. CONTRAST:  29mL ISOVUE-300 IOPAMIDOL (ISOVUE-300) INJECTION 61% COMPARISON:  04/02/2016 PET-CT and prior studies. FINDINGS: Cardiovascular: Upper limits normal heart size and moderate coronary artery calcifications noted. There is no evidence of thoracic aortic aneurysm. Mediastinum/Nodes: Multiple shotty and enlarged mediastinal lymph nodes are identified, the largest in the lower thoracic para-aortic region. These mediastinal lymph nodes for mildly hypermetabolic on recent PET study suspicious for recurrent lymphoma. There is no evidence of pericardial effusion. Lungs/Pleura: A moderate loculated left pleural effusion is again identified. A left pleural catheter with tip in the posterior-inferior left pleural space again noted. Left lower lung atelectasis identified. Mild interlobular septal thickening is noted within both lungs. No discrete mass identified. There is no evidence of pneumothorax or right pleural effusion. Upper Abdomen: No acute abnormality. Upper limits normal spleen size noted. Musculoskeletal: No acute or suspicious abnormality. Degenerative disc disease in the lower cervical spine identified. IMPRESSION: Moderate loculated left pleural effusion, with left  pleural catheter tip in the posterior inferior left pleural space. Associated left lower lung atelectasis. Mediastinal lymph nodes as described, mildly hypermetabolic on recent PET study suspicious for recurrent lymphoma. Coronary artery disease. Electronically Signed   By: Margarette Canada M.D.   On: 04/03/2016 21:47   Dg Chest Port 1 View  Result Date: 04/05/2016 CLINICAL DATA:  Preprocedural evaluation for pleural drainage catheter. EXAM: PORTABLE CHEST 1 VIEW COMPARISON:  CT 04/03/2016.  Chest x-ray 04/03/2016 . FINDINGS: Progressive left pleural effusion noted. Some degree of loculation may be present. Mild cardiomegaly with mild interstitial prominence, a component of mild congestive heart failure cannot be excluded. No pneumothorax . IMPRESSION: 1. Progressive left pleural effusion with possible loculation. 2. Cardiomegaly with mild interstitial prominence. A component of mild congestive heart failure cannot be excluded . Electronically Signed   By: Marcello Moores  Register   On: 04/05/2016 08:01   Scheduled Meds: . Derrill Memo ON 05/01/2016] cyanocobalamin  1,000 mcg Intramuscular Q30 days  . dextromethorphan  30 mg Oral QHS  . doxazosin  4 mg Oral QHS  . eltrombopag  75 mg Oral QAC lunch  . fentaNYL      . ferrous sulfate  325 mg Oral QPC supper  . fluticasone  1 spray Each Nare QHS  . insulin aspart  0-15 Units Subcutaneous TID WC  . insulin glargine  15 Units Subcutaneous Daily  . irbesartan  150 mg Oral Daily  . lidocaine      . metoprolol succinate  100 mg Oral Daily  . midazolam      . multivitamin with minerals  1 tablet Oral QHS  . piperacillin-tazobactam (ZOSYN)  IV  3.375 g Intravenous Q8H  . sodium chloride flush  3 mL Intravenous Q12H  . vancomycin  1,250 mg Intravenous Q24H   Continuous Infusions: . sodium chloride 75 mL/hr at 04/04/16 2052    LOS: 2 days   Kerney Elbe, DO [facility] Pager [phone]  If 7PM-7AM, please contact  night-coverage [email or web address] Password [email or web address] 04/05/2016, 2:47 PM

## 2016-04-05 NOTE — Consult Note (Signed)
   [facility] CM Inpatient Consult   04/05/2016  EWAN GRAU Jan 04, 1931 [id number]   Patient screened for [facility] Management program. Chart reviewed. Spoke with inpatient RNCM about potential [facility] Care Management services for patient. Mrs. Henricks went for procedure today. Will engage for potential [facility] Care Management program at later time.  Marthenia Rolling, MSN-Ed, RN,BSN [facility] Liaison [phone]

## 2016-04-06 ENCOUNTER — Inpatient Hospital Stay (HOSPITAL_COMMUNITY): Payer: Medicare Other

## 2016-04-06 DIAGNOSIS — D46C Myelodysplastic syndrome with isolated del(5q) chromosomal abnormality: Secondary | ICD-10-CM

## 2016-04-06 LAB — CBC WITH DIFFERENTIAL/PLATELET
BASOS PCT: 0 %
Basophils Absolute: 0 10*3/uL (ref 0.0–0.1)
EOS ABS: 0.1 10*3/uL (ref 0.0–0.7)
Eosinophils Relative: 1 %
HEMATOCRIT: 32.1 % — AB (ref 39.0–52.0)
HEMOGLOBIN: 9.5 g/dL — AB (ref 13.0–17.0)
LYMPHS PCT: 14 %
Lymphs Abs: 1.4 10*3/uL (ref 0.7–4.0)
MCH: 23.7 pg — AB (ref 26.0–34.0)
MCHC: 29.6 g/dL — AB (ref 30.0–36.0)
MCV: 80 fL (ref 78.0–100.0)
MONOS PCT: 18 %
Monocytes Absolute: 1.9 10*3/uL — ABNORMAL HIGH (ref 0.1–1.0)
NEUTROS ABS: 6.9 10*3/uL (ref 1.7–7.7)
NEUTROS PCT: 67 %
Platelets: 84 10*3/uL — ABNORMAL LOW (ref 150–400)
RBC: 4.01 MIL/uL — ABNORMAL LOW (ref 4.22–5.81)
RDW: 23.4 % — ABNORMAL HIGH (ref 11.5–15.5)
WBC: 10.3 10*3/uL (ref 4.0–10.5)

## 2016-04-06 LAB — GLUCOSE, CAPILLARY
GLUCOSE-CAPILLARY: 96 mg/dL (ref 65–99)
GLUCOSE-CAPILLARY: 128 mg/dL — AB (ref 65–99)
Glucose-Capillary: 120 mg/dL — ABNORMAL HIGH (ref 65–99)
Glucose-Capillary: 149 mg/dL — ABNORMAL HIGH (ref 65–99)

## 2016-04-06 LAB — BASIC METABOLIC PANEL
Anion gap: 10 (ref 5–15)
BUN: 10 mg/dL (ref 6–20)
CHLORIDE: 107 mmol/L (ref 101–111)
CO2: 20 mmol/L — AB (ref 22–32)
Calcium: 7.9 mg/dL — ABNORMAL LOW (ref 8.9–10.3)
Creatinine, Ser: 1.43 mg/dL — ABNORMAL HIGH (ref 0.61–1.24)
GFR calc non Af Amer: 44 mL/min — ABNORMAL LOW (ref >60)
GFR, EST AFRICAN AMERICAN: 51 mL/min — AB (ref >60)
Glucose, Bld: 103 mg/dL — ABNORMAL HIGH (ref 65–99)
POTASSIUM: 3.7 mmol/L (ref 3.5–5.1)
SODIUM: 137 mmol/L (ref 135–145)

## 2016-04-06 MED ORDER — GUAIFENESIN ER 600 MG PO TB12
600.0000 mg | ORAL_TABLET | Freq: Two times a day (BID) | ORAL | Status: DC
  Administered 2016-04-06 – 2016-04-20 (×28): 600 mg via ORAL
  Filled 2016-04-06 (×29): qty 1

## 2016-04-06 NOTE — Progress Notes (Signed)
PROGRESS NOTE    Ricardo Watkins  PRF:[id number] DOB: 1931-02-16 DOA: 04/03/2016 PCP: [name], MD  Brief Narrative:  Ricardo Watkins is a 82 y.o. male with medical history significant of non-Hodgkin's lymphoma, CAD, type 2 diabetes, hyperlipidemia, hypertension, anemia, prostate CA, diverticulosis, cataracts, DVT, chronic cough, chronic left pleural effusion with insertion of left pleural catheter on 11/10/2015 by [name] who comes to the hospital due to fever, chills, worsening fatigue and malaise. Per patient, his daughter and wife, his chest tube drainage has been decreasing his output for the past several days and he stopped completely yesterday. He normally has up to 500 mL of drainage daily. Patient was worked up and found to be septic and have a loculated effusion. CT Surgery was consulted and is to undergo a Pigtail Catheter Drain by IR yesterday and had ~550 mL out. Patient's old Pleur-X Cathether is still in place and will need to be removed eventually.   Assessment & Plan:   Principal Problem:   Empyema ([place]) Active Problems:   CAD (coronary artery disease)   History of lymphoma   Essential hypertension   Thrombocytopenia (HCC)   Microcytic anemia   Type 2 diabetes mellitus (HCC)   Empyema lung (HCC)  Loculated Effusion/Empyema from Left Obstructed Pleural Catheter for recurrent and chronic Non Malignant Pleural Effusions s/p CT Guided Placement of Left 12 French Pigtail Pleural Drain, improving -Sepsis resolved; Old Drain left in place and will need to be removed -Patient was Febrileat 102.3 when he came in and Bolused 2600 mL of NS in ED -C/w IV Vancomycin and IV Zosyn; Likely D/C Vanomycin in AM -WBC was 12.9 on admission and increased to 13.5 and now is 10.3; Repeat CBC in AM -PET-CT Scan showed Moderate-sized loculated left pleural effusion collection with a PleurX drainage catheter in place. -Cardiothoracic Surgery evaluated and will continue to follow; IR  placed CT Guided Fluid Drain by PigTail Catheter yesterday and drained ~550 mL; - C/w Chest Tube Care. Will Need Fluid Cx -Continue Supplemental Oxygen and wean  -May need VATS procedure eventually -Analgesics as needed. -C/w NS at 75 mL/hr for now; Will likely D/C in Am.  -Will need to Home with Home Health at D/C  Hx of Non-Hodgkins Lymphoma -PET-CT Scan showed Enlarging hypermetabolic mediastinal lymph nodes suggesting recurrent lymphoma. New nodules along the left major fissure and in the left upper lobe subpleural space worrisome for lymphomatous involvement. 5 mm pretracheal lymph node on image number 87 has an SUV max of 2.56. Multiple small prevascular lymph nodes. The largest node measures 5 mm on image number 100 and SUV max is 2.46. 11 mm aorticopulmonary window lymph node on image number 94 has an SUV max of 3.5. Subcarinal lymph node on image number 109 measures 10 mm and has an SUV max of 3.5. Left-sided retrocrural lymph node measures 23 x 16 mm on image number 127 and has SUV max of 5.9. Right-sided periaortic lymph node on image number 121 measures 8.5 mm and SUV max is 4.1.  -Patient was seen by Hematologist/Oncologist [name] yesterday -Continue scheduled follow-ups with Hematology/Oncology after empyema treatment.  MDS (myelodysplastic syndrome) with 5q deletion -Followed by Hematology/Oncology; Seen by [name] today -D/C'd Plavix; P2Y12 from 10/15 WNL -Continue Promacta.  Patient's Hematologist increased it to 75 mg and will reassess next visit.  Hypokalemia, Improved -Patient's K+ went from 3.4 -> 3.7 -Repleted IV Kcl 10 mEQ x 2 yesterday  -Repeat BMP in AM  Insulin Dependent  Type 2 Diabetes Mellitus  -BS was on BMP -CBG's have been running from 59-128 -Last Hemoglobin A1c was 6.7% in July. -Continue Lantus 15 units SQ daily. -CBG monitoring and Moderate SSI Novolog Insulin  CAD  -Continue Metoprolol 100 mg by mouth daily and Irbesartan 150 mg po  daily -Clopidogrel was helped by Hematology/Oncology because of Thrombocytopenia.  Essential Hypertension -Continue Metoprolol 100 mg po daily and Irbesartan 150 mg po daily. -Monitor blood pressure, BUN/creatinine and electrolytes.  Thrombocytopenia  -Platelet Count was improved  -SCDs for DVT prophylaxis. -Continue Promacta.  Patient's Hematologist increased it to 75 mg and will reassess next visit. -Hematology recommended stopping Clopidogrel; P2Y12 from 10/15 WNL -Follow-up with Hematology as scheduled. -Monitor platelet levels by repeat CBC in AM.  Microcytic Anemia -C/w Ferrous Sulfate 325 mg tablet po  -Hb/Hct went from 9.0/30.3 -> 8.7/29.5 -> 9.5/32.1 -Repeat CBC in AM  DVT prophylaxis: SCDs Code Status: FULL Family Communication: Discussed Plan of Care with Wife and daughter-in-law. Disposition Plan: Home with resumption of Home Health   Consultants:   Cardiothoracic Surgery   Intervential Radiology   Hematology/Oncology   Procedures: CT Guided IR Pigtail Cathether Drain  Antimicrobials: IV Vanc and Zosyn  Subjective: Patient seen and examined bedside and states he did not have a goodnight because he was not at home and did not sleep very well. Still a little SOB but no Cp, N/V or Abdominal Pain. Pleur-X catheter still draining.   Objective: Vitals:   04/06/16 0347 04/06/16 0500 04/06/16 0846 04/06/16 1330  BP:  (!) 142/74 (!) 144/68 (!) 143/56  Pulse:  (!) 113 (!) 104 (!) 107  Resp:  (!) 25  18  Temp:  98.5 F (36.9 C)  98.5 F (36.9 C)  TempSrc:  Oral  Oral  SpO2: 93% 91%  93%  Weight:      Height:        Intake/Output Summary (Last 24 hours) at 04/06/16 1412 Last data filed at 04/06/16 1300  Gross per 24 hour  Intake           4322.5 ml  Output             1225 ml  Net           3097.5 ml   Filed Weights   04/03/16 1946 04/03/16 2026 04/04/16 1000  Weight: 85.3 kg (188 lb) 85.3 kg (188 lb) 85.5 kg (188 lb 9.6 oz)    Examination: Physical Exam:  Constitutional: Elderly Male calm and comfortable Eyes:  Lids and conjunctivae normal, sclerae anicteric  ENMT: External Ears, Nose appear normal. Grossly normal hearing.  Neck: Appears normal, supple, no cervical masses, normal ROM, no appreciable thyromegaly, no JVD Respiratory: Diminished Breath sounds on Left. No wheezing, rales, rhonchi or crackles. Normal respiratory effort and patient is not tachypenic. No accessory muscle use.  Cardiovascular: RRR, no murmurs / rubs / gallops. S1 and S2 auscultated. No extremity edema. 2+ pedal pulses. No carotid bruits.  Abdomen: Soft, non-tender, non-distended. No masses palpated. No appreciable hepatosplenomegaly. Bowel sounds positive.  GU: Deferred. Musculoskeletal: No clubbing / cyanosis of digits/nails. No joint deformity upper and lower extremities.  Skin: No rashes, lesions, ulcers. No induration; Warm and dry. Old Pleur-X Catheter bandaged. New one connected to [facility]. Neurologic: CN 2-12 grossly intact with no focal deficits. Sensation intact in all 4 Extremities. Romberg sign cerebellar reflexes not assessed.  Psychiatric: Normal judgment and insight. Alert and oriented x 3. Normal mood and appropriate affect.   Data  Reviewed: I have personally reviewed following labs and imaging studies  CBC:  Recent Labs Lab 04/03/16 1505 04/04/16 0405 04/05/16 0209 04/06/16 0215  WBC 12.9* 13.5* 10.6* 10.3  NEUTROABS 8.6* 8.1* 7.0 6.9  HGB 10.5* 9.0* 8.7* 9.5*  HCT 34.2* 30.3* 29.5* 32.1*  MCV 78.1 79.1 79.1 80.0  PLT 96* 74* 68* 84*   Basic Metabolic Panel:  Recent Labs Lab 04/03/16 1505 04/04/16 0124 04/04/16 0405 04/05/16 0209 04/06/16 0215  NA 132* 136 136 136 137  K 3.9 3.9 3.7 3.4* 3.7  CL 100* 107 107 105 107  CO2 23 21* 21* 23 20*  GLUCOSE 199* 150* 165* 115* 103*  BUN 11 13 13 11 10   CREATININE 1.60* 1.45* 1.47* 1.46* 1.43*  CALCIUM 8.3* 7.5* 7.6* 7.7* 7.9*   GFR: Estimated Creatinine  Clearance: 40.9 mL/min (by C-G formula based on SCr of 1.43 mg/dL (H)). Liver Function Tests:  Recent Labs Lab 04/03/16 1505 04/04/16 0124 04/04/16 0405 04/05/16 0209  AST 20 14* 14* 14*  ALT 17 14* 15* 14*  ALKPHOS 75 58 58 56  BILITOT 0.3 0.3 0.5 0.5  PROT 7.9 6.8 6.6 6.9  ALBUMIN 2.6* 2.1* 2.1* 2.0*   No results for input(s): LIPASE, AMYLASE in the last 168 hours. No results for input(s): AMMONIA in the last 168 hours. Coagulation Profile:  Recent Labs Lab 04/03/16 1910  INR 1.19   Cardiac Enzymes: No results for input(s): CKTOTAL, CKMB, CKMBINDEX, TROPONINI in the last 168 hours. BNP (last 3 results)  Recent Labs  01/27/16 1119  PROBNP 311.0*   HbA1C: No results for input(s): HGBA1C in the last 72 hours. CBG:  Recent Labs Lab 04/05/16 1622 04/05/16 1706 04/05/16 2043 04/06/16 0612 04/06/16 1120  GLUCAP 59* 106* 140* 96 128*   Lipid Profile: No results for input(s): CHOL, HDL, LDLCALC, TRIG, CHOLHDL, LDLDIRECT in the last 72 hours. Thyroid Function Tests: No results for input(s): TSH, T4TOTAL, FREET4, T3FREE, THYROIDAB in the last 72 hours. Anemia Panel: No results for input(s): VITAMINB12, FOLATE, FERRITIN, TIBC, IRON, RETICCTPCT in the last 72 hours. Sepsis Labs:  Recent Labs Lab 04/03/16 1525 04/03/16 1924  LATICACIDVEN 1.88 1.15    Recent Results (from the past 240 hour(s))  Blood Culture (routine x 2)     Status: None (Preliminary result)   Collection Time: 04/03/16  7:01 PM  Result Value Ref Range Status   Specimen Description LEFT ANTECUBITAL  Final   Special Requests BOTTLES DRAWN AEROBIC AND ANAEROBIC 5CC  Final   Culture NO GROWTH 2 DAYS  Final   Report Status PENDING  Incomplete  Blood Culture (routine x 2)     Status: None (Preliminary result)   Collection Time: 04/03/16  7:06 PM  Result Value Ref Range Status   Specimen Description RIGHT ANTECUBITAL  Final   Special Requests BOTTLES DRAWN AEROBIC AND ANAEROBIC 5CC  Final    Culture NO GROWTH 2 DAYS  Final   Report Status PENDING  Incomplete  Urine culture     Status: Abnormal   Collection Time: 04/03/16  8:07 PM  Result Value Ref Range Status   Specimen Description URINE, CLEAN CATCH  Final   Special Requests NONE  Final   Culture MULTIPLE SPECIES PRESENT, SUGGEST RECOLLECTION (A)  Final   Report Status 04/05/2016 FINAL  Final    Radiology Studies: Dg Chest Port 1 View  Result Date: 04/05/2016 CLINICAL DATA:  Preprocedural evaluation for pleural drainage catheter. EXAM: PORTABLE CHEST 1 VIEW COMPARISON:  CT 04/03/2016.  Chest x-ray 04/03/2016 . FINDINGS: Progressive left pleural effusion noted. Some degree of loculation may be present. Mild cardiomegaly with mild interstitial prominence, a component of mild congestive heart failure cannot be excluded. No pneumothorax . IMPRESSION: 1. Progressive left pleural effusion with possible loculation. 2. Cardiomegaly with mild interstitial prominence. A component of mild congestive heart failure cannot be excluded . Electronically Signed   By: Marcello Moores  Register   On: 04/05/2016 08:01   Ct Image Guided Fluid Drain By Catheter  Result Date: 04/05/2016 CLINICAL DATA:  Lymphoma, and left-sided empyema with residual loculated left pleural fluid remaining. Request has been made to place a percutaneous pleural drainage catheter. EXAM: CT GUIDED LEFT THORACOSTOMY TUBE PLACEMENT FOR EMPYEMA ANESTHESIA/SEDATION: 1.0 mg IV Versed 25 mcg IV Fentanyl Total Moderate Sedation Time:  20 minutes The patient's level of consciousness and physiologic status were continuously monitored during the procedure by Radiology nursing. PROCEDURE: The procedure, risks, benefits, and alternatives were explained to the patient's wife. Questions regarding the procedure were encouraged and answered. The patient's wife understands and consents to the procedure. A time out was performed prior to initiating the procedure. The left upper chest wall was prepped  with chlorhexidine in a sterile fashion, and a sterile drape was applied covering the operative field. A sterile gown and sterile gloves were used for the procedure. Local anesthesia was provided with 1% Lidocaine. CT was performed in a supine position. After localizing the most appropriate target for pleural drainage, an 18 gauge trocar needle was advanced under CT guidance into the left pleural space. Fluid was aspirated. A guidewire was then advanced through the needle and the needle removed. The percutaneous tract was dilated over the guidewire. A 12 French pigtail drainage catheter was then advanced over the wire. After the catheter was formed, additional CT images were performed. The catheter was then connected to a [place] Pleur-Evac device. COMPLICATIONS: None FINDINGS: The largest loculation of residual left pleural fluid is in the lateral superior hemithorax. Aspiration yielded clear, yellowish fluid. Thorax. After pleural drain placement, there is return of yellowish colored clear fluid. IMPRESSION: CT-guided left chest thoracostomy tube placement with placement of 12 French drain into the left pleural space. The site of loculated fluid in the superior and lateral aspect of the hemithorax was targeted for catheter placement. Electronically Signed   By: Aletta Edouard M.D.   On: 04/05/2016 15:54   Scheduled Meds: . [START ON 05/01/2016] cyanocobalamin  1,000 mcg Intramuscular Q30 days  . dextromethorphan  30 mg Oral QHS  . doxazosin  4 mg Oral QHS  . eltrombopag  75 mg Oral QAC lunch  . ferrous sulfate  325 mg Oral QPC supper  . fluticasone  1 spray Each Nare QHS  . guaiFENesin  600 mg Oral BID  . insulin aspart  0-15 Units Subcutaneous TID WC  . insulin glargine  15 Units Subcutaneous Daily  . irbesartan  150 mg Oral Daily  . metoprolol succinate  100 mg Oral Daily  . multivitamin with minerals  1 tablet Oral QHS  . piperacillin-tazobactam (ZOSYN)  IV  3.375 g Intravenous Q8H  . sodium  chloride flush  3 mL Intravenous Q12H  . vancomycin  1,250 mg Intravenous Q24H   Continuous Infusions: . sodium chloride 75 mL/hr at 04/06/16 1128    LOS: 3 days   Kerney Elbe, DO [facility] Pager [phone]  If 7PM-7AM, please contact night-coverage [email or web address] Password [email or web address] 04/06/2016, 2:12 PM

## 2016-04-06 NOTE — Progress Notes (Addendum)
      [address]       [place] [address]             [phone]         Subjective: Still feels short of breath. Slept poorly.   Objective: Vital signs in last 24 hours: Temp:  [98 F (36.7 C)-98.5 F (36.9 C)] 98.5 F (36.9 C) (10/17 0500) Pulse Rate:  [96-113] 113 (10/17 0500) Cardiac Rhythm: Sinus tachycardia (10/16 1900) Resp:  [18-27] 25 (10/17 0500) BP: (119-151)/(59-76) 142/74 (10/17 0500) SpO2:  [89 %-96 %] 91 % (10/17 0500)     Intake/Output from previous day: 10/16 0701 - 10/17 0700 In: 3842.5 [P.O.:600; I.V.:2392.5; IV Piggyback:850] Out: 1225 [Urine:825; Chest Tube:400] Intake/Output this shift: No intake/output data recorded.  General appearance: alert, cooperative and no distress Heart: regular rate and rhythm Lungs: clear to auscultation bilaterally, diminished in the LLL Abdomen: soft, non-tender; bowel sounds normal; no masses,  no organomegaly Extremities: extremities normal, atraumatic, no cyanosis or edema Wound: clean and dry  Lab Results:  Recent Labs  04/05/16 0209 04/06/16 0215  WBC 10.6* 10.3  HGB 8.7* 9.5*  HCT 29.5* 32.1*  PLT 68* 84*   BMET:  Recent Labs  04/05/16 0209 04/06/16 0215  NA 136 137  K 3.4* 3.7  CL 105 107  CO2 23 20*  GLUCOSE 115* 103*  BUN 11 10  CREATININE 1.46* 1.43*  CALCIUM 7.7* 7.9*    PT/INR:  Recent Labs  04/03/16 1910  LABPROT 15.1  INR 1.19   ABG No results found for: PHART, HCO3, TCO2, ACIDBASEDEF, O2SAT CBG (last 3)   Recent Labs  04/05/16 1706 04/05/16 2043 04/06/16 0612  GLUCAP 106* 140* 96    Assessment/Plan: S/P pleurx catheter for recurrent nonmalignant pleural effusion.  1. Pleurx catheter placed by IR yesterday. 475ml output. CXR yesterday showed possible loculated left pleural effusion.    2. P2Y12 from 10/15 within normal range, platelets trending up  Plan: On 2L [place]. Wean as tolerated. Ambulate TID. Still with previous pleurx catheter which will  eventually need removed. New pigtail  draining okay with straw colored pleural fluid.    LOS: 3 days    Ricardo Watkins 04/06/2016  Pigtail catheter has drained the upper portion of his empyema, appreciate IR support of this patient Will remove the Pleurx catheter in the OR Thursday and place a basilar chest tube for the lower aspect of his empyema.  patient examined and medical record reviewed,agree with above note. Ricardo Watkins 04/06/2016

## 2016-04-06 NOTE — Care Management Note (Addendum)
Case Management Note Marvetta Gibbons RN, BSN Unit 2W-Case Manager [phone]  Patient Details  Name: Ricardo Watkins MRN: [id number] Date of Birth: Dec 02, 1930  Subjective/Objective:  Pt admitted with empyema and decreasing drainage from pleurx at home                  Action/Plan: PTA pt lived at home with wife and daughter support , has PleurX cath - pt was active with [facility] for HHRN/PT services- will need resumption orders for discharge for continued [facility] needs.   Expected Discharge Date:                  Expected Discharge Plan:  [place]  In-House Referral:     Discharge planning Services  CM Consult  Post Acute Care Choice:  Home Health, Resumption of Svcs/PTA Provider Choice offered to:  Patient  DME Arranged:    DME Agency:     HH Arranged:  RN, PT [place] Agency:  [place]  Status of Service:  In process, will continue to follow  If discussed at Long Length of Stay Meetings, dates discussed:    Additional Comments:  [name], RN 04/06/2016, 10:00 AM

## 2016-04-06 NOTE — Progress Notes (Signed)
Attempted to drain plurex but nothing came out. MD notified. Unable to send for culture.  Ricardo Watkins, Mervin Kung RN

## 2016-04-06 NOTE — Progress Notes (Signed)
Referring Physician(s): Dr. Tharon Aquas Trigt  Supervising Physician: Corrie Mckusick  Patient Status:  [facility] - In-pt  Chief Complaint:  Loculated pleural effusion  Subjective:  Ricardo Watkins is doing ok today. He states he feels ok. No complaints regarding the tube.  Allergies: Glucophage [metformin hydrochloride]; Zetia [ezetimibe]; Fenofibrate; and Niacin-lovastatin er  Medications: Prior to Admission medications   Medication Sig Start Date End Date Taking? Authorizing Provider  acetaminophen (TYLENOL) 500 MG tablet Take 1,000 mg by mouth every 6 (six) hours as needed for mild pain, moderate pain or headache.    Yes Historical Provider, MD  cyanocobalamin (,VITAMIN B-12,) 1000 MCG/ML injection Inject 1,000 mcg into the muscle every 30 (thirty) days. Last injection 03/31/16   Yes Historical Provider, MD  Dextromethorphan Polistirex (DELSYM PO) Take 15 mLs by mouth at bedtime.   Yes Historical Provider, MD  doxazosin (CARDURA) 4 MG tablet TAKE 1 TABLET BY MOUTH ONCE A DAY Patient taking differently: TAKE 1 TABLET BY MOUTH DAILY AT BEDTIME 02/09/16  Yes [name], MD  eltrombopag (PROMACTA) 75 MG tablet Take 1 tablet (75 mg total) by mouth daily. Take on an empty stomach 1 hour before a meal or 2 hours after Patient taking differently: Take 75 mg by mouth daily before lunch. Take on an empty stomach 1 hour before a meal or 2 hours after 03/29/16  Yes Heath Lark, MD  ferrous sulfate 325 (65 FE) MG tablet Take 325 mg by mouth daily after supper.    Yes Historical Provider, MD  fluticasone (FLONASE) 50 MCG/ACT nasal spray Place 1 spray into both nostrils at bedtime.  03/16/16  Yes Historical Provider, MD  hydrocortisone cream 1 % Apply 1 application topically at bedtime as needed for itching.   Yes Historical Provider, MD  Insulin Glargine (LANTUS SOLOSTAR) 100 UNIT/ML Solostar Pen INJECT 15 UNITS INTO THE SKIN EACH MORNING Patient taking differently: Inject 15 Units into the skin daily  after breakfast. INJECT 15 UNITS INTO THE SKIN EACH MO 02/21/16  Yes Shanker Kristeen Mans, MD  meclizine (ANTIVERT) 12.5 MG tablet Take 1 tablet (12.5 mg total) by mouth 3 (three) times daily as needed for dizziness. 06/27/15  Yes [name], MD  metoCLOPramide (REGLAN) 5 MG tablet Take 1 tablet (5 mg total) by mouth 3 (three) times daily before meals. Patient taking differently: Take 5 mg by mouth 3 (three) times daily as needed for nausea or vomiting.  03/25/15  Yes [name], MD  metoprolol succinate (TOPROL-XL) 100 MG 24 hr tablet TAKE 1 TABLET BY MOUTH ONCE A DAY 12/11/15  Yes [name], MD  Multiple Vitamin (MULTIVITAMIN WITH MINERALS) TABS tablet Take 1 tablet by mouth at bedtime.   Yes Historical Provider, MD  OVER THE COUNTER MEDICATION Place 1 drop into both eyes daily as needed (dry eyes). Over the counter lubricating eye drop   Yes Historical Provider, MD  valsartan (DIOVAN) 160 MG tablet Take 1 tablet (160 mg total) by mouth daily. 11/14/15  Yes Tanda Rockers, MD  clopidogrel (PLAVIX) 75 MG tablet TAKE 1 TABLET BY MOUTH DAILY WITH BREAKFAST. Patient taking differently: Take 75 mg by mouth daily with breakfast.  09/01/15   [name], MD  glucose blood test strip 1 each by Other route daily. Fasting each morning 10/21/15   [name], MD     Vital Signs: BP (!) 144/68   Pulse (!) 104   Temp 98.5 F (36.9 C) (Oral)  Resp (!) 25   Ht 5\' 7"  (1.702 m)   Wt 188 lb 9.6 oz (85.5 kg)   SpO2 91%   BMI 29.54 kg/m   Physical Exam  Awake and alert NAD Chest tube in place, sit looks good ~550 ml output since placement Clear yellow pink tinged fluid No air leak.  Imaging: Dg Chest 2 View  Result Date: 04/03/2016 CLINICAL DATA:  Sepsis.  Concern for empyema EXAM: CHEST  2 VIEW COMPARISON:  03/03/2016 FINDINGS: Loculated left pleural effusion, moderate size, thickest at the base. There is a tunneled pleural catheter at the left base with alignment that is stable  from 04/02/2016. The underlying lung is partly atelectatic. Normal heart size. The lower aorta is obscured by a medial pleural disease. IMPRESSION: Moderate loculated left pleural effusion with tunneled pleural catheter. Appearance is stable compared to 04/02/2016 PET-CT. Electronically Signed   By: Monte Fantasia M.D.   On: 04/03/2016 20:58   Ct Chest W Contrast  Result Date: 04/03/2016 CLINICAL DATA:  82 year old male with fever. Patient with left pleural drainage catheter. History of lymphoma. EXAM: CT CHEST WITH CONTRAST TECHNIQUE: Multidetector CT imaging of the chest was performed during intravenous contrast administration. CONTRAST:  70mL ISOVUE-300 IOPAMIDOL (ISOVUE-300) INJECTION 61% COMPARISON:  04/02/2016 PET-CT and prior studies. FINDINGS: Cardiovascular: Upper limits normal heart size and moderate coronary artery calcifications noted. There is no evidence of thoracic aortic aneurysm. Mediastinum/Nodes: Multiple shotty and enlarged mediastinal lymph nodes are identified, the largest in the lower thoracic para-aortic region. These mediastinal lymph nodes for mildly hypermetabolic on recent PET study suspicious for recurrent lymphoma. There is no evidence of pericardial effusion. Lungs/Pleura: A moderate loculated left pleural effusion is again identified. A left pleural catheter with tip in the posterior-inferior left pleural space again noted. Left lower lung atelectasis identified. Mild interlobular septal thickening is noted within both lungs. No discrete mass identified. There is no evidence of pneumothorax or right pleural effusion. Upper Abdomen: No acute abnormality. Upper limits normal spleen size noted. Musculoskeletal: No acute or suspicious abnormality. Degenerative disc disease in the lower cervical spine identified. IMPRESSION: Moderate loculated left pleural effusion, with left pleural catheter tip in the posterior inferior left pleural space. Associated left lower lung atelectasis.  Mediastinal lymph nodes as described, mildly hypermetabolic on recent PET study suspicious for recurrent lymphoma. Coronary artery disease. Electronically Signed   By: Margarette Canada M.D.   On: 04/03/2016 21:47   Nm Pet Image Restag (ps) Skull Base To Thigh  Result Date: 04/02/2016 CLINICAL DATA:  Subsequent treatment strategy for lymphoma. EXAM: NUCLEAR MEDICINE PET SKULL BASE TO THIGH TECHNIQUE: 12.4 mCi F-18 FDG was injected intravenously. Full-ring PET imaging was performed from the skull base to thigh after the radiotracer. CT data was obtained and used for attenuation correction and anatomic localization. FASTING BLOOD GLUCOSE:  Value: 119 mg/dl COMPARISON:  10/30/2014 FINDINGS: NECK No hypermetabolic lymph nodes in the neck. CHEST There is a moderate-sized left loculated pleural effusion. No hypermetabolism to suggest a malignant effusion. It measures as simple fluid. New line small nodules are noted along the left major fissure. These are likely lymph nodes. Is also 3 subpleural nodules in the left upper on image number 104. No obvious hypermetabolism. SUV max is 1.7. These are suspicious for lymphomatous nodules however and they are new since the prior study. Enlarging mediastinal lymph nodes are mildly hypermetabolic and suspicious for recurrent lymphoma. 5 mm pretracheal lymph node on image number 87 has an SUV max of 2.56. Multiple  small prevascular lymph nodes. The largest node measures 5 mm on image number 100 and SUV max is 2.46. 11 mm aorticopulmonary window lymph node on image number 94 has an SUV max of 3.5. Subcarinal lymph node on image number 109 measures 10 mm and has an SUV max of 3.5. Left-sided retrocrural lymph node measures 23 x 16 mm on image number 127 and has SUV max of 5.9. Right-sided periaortic lymph node on image number 121 measures 8.5 mm and SUV max is 4.1. ABDOMEN/PELVIS No abnormal hypermetabolic activity within the liver, pancreas, adrenal glands, or spleen. No hypermetabolic  lymph nodes in the abdomen or pelvis. Stable advanced atherosclerotic calcifications involving the aorta and branch vessels. The right renal artery stent is noted. Brachytherapy seeds are noted the prostate gland. There is a large right inguinal hernia containing fat. SKELETON No focal hypermetabolic activity to suggest skeletal metastasis. IMPRESSION: 1. Enlarging hypermetabolic mediastinal lymph nodes suggesting recurrent lymphoma. 2. New nodules along the left major fissure and in the left upper lobe subpleural space worrisome for lymphomatous involvement. 3. Moderate-sized loculated left pleural effusion collection with a PleurX drainage catheter in place. 4. No findings for adenopathy in the neck, abdomen or pelvis. Electronically Signed   By: Marijo Sanes M.D.   On: 04/02/2016 14:52   Dg Chest Port 1 View  Result Date: 04/05/2016 CLINICAL DATA:  Preprocedural evaluation for pleural drainage catheter. EXAM: PORTABLE CHEST 1 VIEW COMPARISON:  CT 04/03/2016.  Chest x-ray 04/03/2016 . FINDINGS: Progressive left pleural effusion noted. Some degree of loculation may be present. Mild cardiomegaly with mild interstitial prominence, a component of mild congestive heart failure cannot be excluded. No pneumothorax . IMPRESSION: 1. Progressive left pleural effusion with possible loculation. 2. Cardiomegaly with mild interstitial prominence. A component of mild congestive heart failure cannot be excluded . Electronically Signed   By: Marcello Moores  Register   On: 04/05/2016 08:01   Ct Image Guided Fluid Drain By Catheter  Result Date: 04/05/2016 CLINICAL DATA:  Lymphoma, and left-sided empyema with residual loculated left pleural fluid remaining. Request has been made to place a percutaneous pleural drainage catheter. EXAM: CT GUIDED LEFT THORACOSTOMY TUBE PLACEMENT FOR EMPYEMA ANESTHESIA/SEDATION: 1.0 mg IV Versed 25 mcg IV Fentanyl Total Moderate Sedation Time:  20 minutes The patient's level of consciousness and  physiologic status were continuously monitored during the procedure by Radiology nursing. PROCEDURE: The procedure, risks, benefits, and alternatives were explained to the patient's wife. Questions regarding the procedure were encouraged and answered. The patient's wife understands and consents to the procedure. A time out was performed prior to initiating the procedure. The left upper chest wall was prepped with chlorhexidine in a sterile fashion, and a sterile drape was applied covering the operative field. A sterile gown and sterile gloves were used for the procedure. Local anesthesia was provided with 1% Lidocaine. CT was performed in a supine position. After localizing the most appropriate target for pleural drainage, an 18 gauge trocar needle was advanced under CT guidance into the left pleural space. Fluid was aspirated. A guidewire was then advanced through the needle and the needle removed. The percutaneous tract was dilated over the guidewire. A 12 French pigtail drainage catheter was then advanced over the wire. After the catheter was formed, additional CT images were performed. The catheter was then connected to a [place] Pleur-Evac device. COMPLICATIONS: None FINDINGS: The largest loculation of residual left pleural fluid is in the lateral superior hemithorax. Aspiration yielded clear, yellowish fluid. Thorax. After pleural drain  placement, there is return of yellowish colored clear fluid. IMPRESSION: CT-guided left chest thoracostomy tube placement with placement of 12 French drain into the left pleural space. The site of loculated fluid in the superior and lateral aspect of the hemithorax was targeted for catheter placement. Electronically Signed   By: Aletta Edouard M.D.   On: 04/05/2016 15:54    Labs:  CBC:  Recent Labs  04/03/16 1505 04/04/16 0405 04/05/16 0209 04/06/16 0215  WBC 12.9* 13.5* 10.6* 10.3  HGB 10.5* 9.0* 8.7* 9.5*  HCT 34.2* 30.3* 29.5* 32.1*  PLT 96* 74* 68* 84*     COAGS:  Recent Labs  02/10/16 1314 04/03/16 1910  INR 1.10 1.19  APTT 31 35    BMP:  Recent Labs  04/04/16 0124 04/04/16 0405 04/05/16 0209 04/06/16 0215  NA 136 136 136 137  K 3.9 3.7 3.4* 3.7  CL 107 107 105 107  CO2 21* 21* 23 20*  GLUCOSE 150* 165* 115* 103*  BUN 13 13 11 10   CALCIUM 7.5* 7.6* 7.7* 7.9*  CREATININE 1.45* 1.47* 1.46* 1.43*  GFRNONAA 43* 42* 43* 44*  GFRAA 50* 49* 49* 51*    LIVER FUNCTION TESTS:  Recent Labs  04/03/16 1505 04/04/16 0124 04/04/16 0405 04/05/16 0209  BILITOT 0.3 0.3 0.5 0.5  AST 20 14* 14* 14*  ALT 17 14* 15* 14*  ALKPHOS 75 58 58 56  PROT 7.9 6.8 6.6 6.9  ALBUMIN 2.6* 2.1* 2.1* 2.0*    Assessment and Plan:  Loculated pleural effusion  S/P chest catheter placed by Dr. Kathlene Cote yesterday.  Continue routine chest tube care and record output.  Will follow  Electronically Signed: Murrell Redden PA-C 04/06/2016, 10:17 AM   I spent a total of 15 Minutes at the the patient's bedside AND on the patient's hospital floor or unit, greater than 50% of which was counseling/coordinating care for f/u after chest catheter placement.

## 2016-04-06 NOTE — Progress Notes (Signed)
Pharmacy Antibiotic Note  Ricardo Watkins is a 82 y.o. male admitted on 04/03/2016 with sepsis.  Continues on Vancomycin and Zosyn Day # 4 Scr stable, afebrile, WBC trending down Cultures negative to date   Plan: Vancomycin 1250mg  IV q24h - consider stopping? Zosyn 3.375g IV q8h extended infusion Monitor renal function   Height: 5\' 7"  (170.2 cm) Weight: 188 lb 9.6 oz (85.5 kg) IBW/kg (Calculated) : 66.1  Temp (24hrs), Avg:98.3 F (36.8 C), Min:98 F (36.7 C), Max:98.5 F (36.9 C)   Recent Labs Lab 04/03/16 1505 04/03/16 1525 04/03/16 1924 04/04/16 0124 04/04/16 0405 04/05/16 0209 04/06/16 0215  WBC 12.9*  --   --   --  13.5* 10.6* 10.3  CREATININE 1.60*  --   --  1.45* 1.47* 1.46* 1.43*  LATICACIDVEN  --  1.88 1.15  --   --   --   --     Estimated Creatinine Clearance: 40.9 mL/min (by C-G formula based on SCr of 1.43 mg/dL (H)).    Allergies  Allergen Reactions  . Glucophage [Metformin Hydrochloride] Other (See Comments)    Chest pain  . Zetia [Ezetimibe] Other (See Comments)    weakness  . Fenofibrate Rash  . Niacin-Lovastatin Er Rash    Antimicrobials this admission:  Vanc 10/14 >>  Zosyn 10/14 >>   Microbiology results:  10/14 BCx: NGTD 10/14 UCx: NG  Thank you for allowing pharmacy to be a part of this patient's care. Anette Guarneri, PharmD [phone]  04/06/2016, 9:35 AM

## 2016-04-07 ENCOUNTER — Inpatient Hospital Stay (HOSPITAL_COMMUNITY): Payer: Medicare Other

## 2016-04-07 ENCOUNTER — Encounter: Payer: Medicare Other | Admitting: Cardiothoracic Surgery

## 2016-04-07 LAB — GLUCOSE, CAPILLARY
GLUCOSE-CAPILLARY: 159 mg/dL — AB (ref 65–99)
Glucose-Capillary: 97 mg/dL (ref 65–99)
Glucose-Capillary: 164 mg/dL — ABNORMAL HIGH (ref 65–99)
Glucose-Capillary: 225 mg/dL — ABNORMAL HIGH (ref 65–99)

## 2016-04-07 LAB — CBC WITH DIFFERENTIAL/PLATELET
BASOS ABS: 0 10*3/uL (ref 0.0–0.1)
Basophils Relative: 0 %
EOS ABS: 0 10*3/uL (ref 0.0–0.7)
Eosinophils Relative: 0 %
HCT: 30.4 % — ABNORMAL LOW (ref 39.0–52.0)
HEMOGLOBIN: 8.8 g/dL — AB (ref 13.0–17.0)
LYMPHS PCT: 15 %
Lymphs Abs: 1.7 10*3/uL (ref 0.7–4.0)
MCH: 23.2 pg — ABNORMAL LOW (ref 26.0–34.0)
MCHC: 28.9 g/dL — AB (ref 30.0–36.0)
MCV: 80 fL (ref 78.0–100.0)
Monocytes Absolute: 2 10*3/uL — ABNORMAL HIGH (ref 0.1–1.0)
Monocytes Relative: 18 %
NEUTROS ABS: 7.3 10*3/uL (ref 1.7–7.7)
Neutrophils Relative %: 67 %
Platelets: 76 10*3/uL — ABNORMAL LOW (ref 150–400)
RBC: 3.8 MIL/uL — AB (ref 4.22–5.81)
RDW: 23.4 % — AB (ref 11.5–15.5)
WBC: 11 10*3/uL — AB (ref 4.0–10.5)

## 2016-04-07 LAB — BASIC METABOLIC PANEL
Anion gap: 9 (ref 5–15)
BUN: 7 mg/dL (ref 6–20)
CALCIUM: 8 mg/dL — AB (ref 8.9–10.3)
CO2: 24 mmol/L (ref 22–32)
CREATININE: 1.34 mg/dL — AB (ref 0.61–1.24)
Chloride: 104 mmol/L (ref 101–111)
GFR calc non Af Amer: 47 mL/min — ABNORMAL LOW (ref >60)
GFR, EST AFRICAN AMERICAN: 55 mL/min — AB (ref >60)
Glucose, Bld: 109 mg/dL — ABNORMAL HIGH (ref 65–99)
Potassium: 3.3 mmol/L — ABNORMAL LOW (ref 3.5–5.1)
SODIUM: 137 mmol/L (ref 135–145)

## 2016-04-07 LAB — PHOSPHORUS: PHOSPHORUS: 2.6 mg/dL (ref 2.5–4.6)

## 2016-04-07 LAB — MAGNESIUM: MAGNESIUM: 1.9 mg/dL (ref 1.7–2.4)

## 2016-04-07 NOTE — Consult Note (Signed)
   [facility] CM Inpatient Consult   04/07/2016  Ricardo Watkins 1931-05-14 [id number]    Patient screened for [place] Management services. Spoke with inpatient RNCM. Went to bedside to offer and explain [facility] Care Management program with Mr. Litchford, wife, and daughter. They pleasantly declined [facility] Care Management follow up. His daughter states, "mom is very hard of hearing and my dad has great follow up and is closely connected to his doctors".  Accepted [place] Management brochure with contact information and 24-hr nurse line magnet to call in future if they change their mind. Made inpatient RNCM aware that Mr. Puett and family declined [place] Management program services.  Marthenia Rolling, MSN-Ed, RN,BSN [facility] Liaison [phone]

## 2016-04-07 NOTE — Progress Notes (Signed)
Referring Physician(s): Dr. Tharon Aquas Trigt  Supervising Physician: Jacqulynn Cadet  Patient Status:  [facility] - In-pt  Chief Complaint:  Loculated pleural effusion  Subjective:  Ricardo Watkins is doing well. No new complaints.  Allergies: Glucophage [metformin hydrochloride]; Zetia [ezetimibe]; Fenofibrate; and Niacin-lovastatin er  Medications: Prior to Admission medications   Medication Sig Start Date End Date Taking? Authorizing Provider  acetaminophen (TYLENOL) 500 MG tablet Take 1,000 mg by mouth every 6 (six) hours as needed for mild pain, moderate pain or headache.    Yes Historical Provider, MD  cyanocobalamin (,VITAMIN B-12,) 1000 MCG/ML injection Inject 1,000 mcg into the muscle every 30 (thirty) days. Last injection 03/31/16   Yes Historical Provider, MD  Dextromethorphan Polistirex (DELSYM PO) Take 15 mLs by mouth at bedtime.   Yes Historical Provider, MD  doxazosin (CARDURA) 4 MG tablet TAKE 1 TABLET BY MOUTH ONCE A DAY Patient taking differently: TAKE 1 TABLET BY MOUTH DAILY AT BEDTIME 02/09/16  Yes [name], MD  eltrombopag (PROMACTA) 75 MG tablet Take 1 tablet (75 mg total) by mouth daily. Take on an empty stomach 1 hour before a meal or 2 hours after Patient taking differently: Take 75 mg by mouth daily before lunch. Take on an empty stomach 1 hour before a meal or 2 hours after 03/29/16  Yes Heath Lark, MD  ferrous sulfate 325 (65 FE) MG tablet Take 325 mg by mouth daily after supper.    Yes Historical Provider, MD  fluticasone (FLONASE) 50 MCG/ACT nasal spray Place 1 spray into both nostrils at bedtime.  03/16/16  Yes Historical Provider, MD  hydrocortisone cream 1 % Apply 1 application topically at bedtime as needed for itching.   Yes Historical Provider, MD  Insulin Glargine (LANTUS SOLOSTAR) 100 UNIT/ML Solostar Pen INJECT 15 UNITS INTO THE SKIN EACH MORNING Patient taking differently: Inject 15 Units into the skin daily after breakfast. INJECT 15 UNITS INTO  THE SKIN EACH MO 02/21/16  Yes Shanker Kristeen Mans, MD  meclizine (ANTIVERT) 12.5 MG tablet Take 1 tablet (12.5 mg total) by mouth 3 (three) times daily as needed for dizziness. 06/27/15  Yes [name], MD  metoCLOPramide (REGLAN) 5 MG tablet Take 1 tablet (5 mg total) by mouth 3 (three) times daily before meals. Patient taking differently: Take 5 mg by mouth 3 (three) times daily as needed for nausea or vomiting.  03/25/15  Yes [name], MD  metoprolol succinate (TOPROL-XL) 100 MG 24 hr tablet TAKE 1 TABLET BY MOUTH ONCE A DAY 12/11/15  Yes [name], MD  Multiple Vitamin (MULTIVITAMIN WITH MINERALS) TABS tablet Take 1 tablet by mouth at bedtime.   Yes Historical Provider, MD  OVER THE COUNTER MEDICATION Place 1 drop into both eyes daily as needed (dry eyes). Over the counter lubricating eye drop   Yes Historical Provider, MD  valsartan (DIOVAN) 160 MG tablet Take 1 tablet (160 mg total) by mouth daily. 11/14/15  Yes Tanda Rockers, MD  clopidogrel (PLAVIX) 75 MG tablet TAKE 1 TABLET BY MOUTH DAILY WITH BREAKFAST. Patient taking differently: Take 75 mg by mouth daily with breakfast.  09/01/15   [name], MD  glucose blood test strip 1 each by Other route daily. Fasting each morning 10/21/15   [name], MD     Vital Signs: BP (!) 162/67 (BP Location: Right Arm)   Pulse (!) 113   Temp 97.8 F (36.6 C) (Oral)   Resp (!) 24  Ht 5\' 7"  (1.702 m)   Wt 188 lb 9.6 oz (85.5 kg)   SpO2 96%   BMI 29.54 kg/m   Physical Exam Awake and alert NAD Chest tube in place, site looks good Now to water seal. Only about 50 ml output since yesterday Clear yellow pink tinged fluid No air leak.  Imaging: Dg Chest 2 View  Result Date: 04/03/2016 CLINICAL DATA:  Sepsis.  Concern for empyema EXAM: CHEST  2 VIEW COMPARISON:  03/03/2016 FINDINGS: Loculated left pleural effusion, moderate size, thickest at the base. There is a tunneled pleural catheter at the left base with  alignment that is stable from 04/02/2016. The underlying lung is partly atelectatic. Normal heart size. The lower aorta is obscured by a medial pleural disease. IMPRESSION: Moderate loculated left pleural effusion with tunneled pleural catheter. Appearance is stable compared to 04/02/2016 PET-CT. Electronically Signed   By: Monte Fantasia M.D.   On: 04/03/2016 20:58   Ct Chest W Contrast  Result Date: 04/03/2016 CLINICAL DATA:  82 year old male with fever. Patient with left pleural drainage catheter. History of lymphoma. EXAM: CT CHEST WITH CONTRAST TECHNIQUE: Multidetector CT imaging of the chest was performed during intravenous contrast administration. CONTRAST:  34mL ISOVUE-300 IOPAMIDOL (ISOVUE-300) INJECTION 61% COMPARISON:  04/02/2016 PET-CT and prior studies. FINDINGS: Cardiovascular: Upper limits normal heart size and moderate coronary artery calcifications noted. There is no evidence of thoracic aortic aneurysm. Mediastinum/Nodes: Multiple shotty and enlarged mediastinal lymph nodes are identified, the largest in the lower thoracic para-aortic region. These mediastinal lymph nodes for mildly hypermetabolic on recent PET study suspicious for recurrent lymphoma. There is no evidence of pericardial effusion. Lungs/Pleura: A moderate loculated left pleural effusion is again identified. A left pleural catheter with tip in the posterior-inferior left pleural space again noted. Left lower lung atelectasis identified. Mild interlobular septal thickening is noted within both lungs. No discrete mass identified. There is no evidence of pneumothorax or right pleural effusion. Upper Abdomen: No acute abnormality. Upper limits normal spleen size noted. Musculoskeletal: No acute or suspicious abnormality. Degenerative disc disease in the lower cervical spine identified. IMPRESSION: Moderate loculated left pleural effusion, with left pleural catheter tip in the posterior inferior left pleural space. Associated left  lower lung atelectasis. Mediastinal lymph nodes as described, mildly hypermetabolic on recent PET study suspicious for recurrent lymphoma. Coronary artery disease. Electronically Signed   By: Margarette Canada M.D.   On: 04/03/2016 21:47   Dg Chest Port 1 View  Addendum Date: 04/07/2016   ADDENDUM REPORT: 04/07/2016 08:27 ADDENDUM: Critical Value/emergent results were called by telephone at the time of interpretation on 04/07/2016 at 8:27 am to the patient's interventional radiologist, who verbally acknowledged these results. Electronically Signed   By: Marcello Moores  Register   On: 04/07/2016 08:27   Result Date: 04/07/2016 CLINICAL DATA:  Shortness of breath. EXAM: PORTABLE CHEST 1 VIEW COMPARISON:  CT 04/05/2016. FINDINGS: Left chest tube noted. Previously identified large left left up pleural effusion is been removed. Residual left lower pleural effusion. Tiny left pneumothorax present. This was discussed with the patient's interventional radiologist. Stable cardiomegaly. No acute bony abnormality. IMPRESSION: Left chest tube noted in good anatomic position. Significant resolution of left-sided pneumothorax. Residual left lower pleural effusion present. Miniscule left pneumothorax noted. Electronically Signed: By: Marcello Moores  Register On: 04/06/2016 16:30   Dg Chest Port 1 View  Result Date: 04/05/2016 CLINICAL DATA:  Preprocedural evaluation for pleural drainage catheter. EXAM: PORTABLE CHEST 1 VIEW COMPARISON:  CT 04/03/2016.  Chest x-ray 04/03/2016 .  FINDINGS: Progressive left pleural effusion noted. Some degree of loculation may be present. Mild cardiomegaly with mild interstitial prominence, a component of mild congestive heart failure cannot be excluded. No pneumothorax . IMPRESSION: 1. Progressive left pleural effusion with possible loculation. 2. Cardiomegaly with mild interstitial prominence. A component of mild congestive heart failure cannot be excluded . Electronically Signed   By: Marcello Moores  Register   On:  04/05/2016 08:01   Ct Image Guided Fluid Drain By Catheter  Result Date: 04/05/2016 CLINICAL DATA:  Lymphoma, and left-sided empyema with residual loculated left pleural fluid remaining. Request has been made to place a percutaneous pleural drainage catheter. EXAM: CT GUIDED LEFT THORACOSTOMY TUBE PLACEMENT FOR EMPYEMA ANESTHESIA/SEDATION: 1.0 mg IV Versed 25 mcg IV Fentanyl Total Moderate Sedation Time:  20 minutes The patient's level of consciousness and physiologic status were continuously monitored during the procedure by Radiology nursing. PROCEDURE: The procedure, risks, benefits, and alternatives were explained to the patient's wife. Questions regarding the procedure were encouraged and answered. The patient's wife understands and consents to the procedure. A time out was performed prior to initiating the procedure. The left upper chest wall was prepped with chlorhexidine in a sterile fashion, and a sterile drape was applied covering the operative field. A sterile gown and sterile gloves were used for the procedure. Local anesthesia was provided with 1% Lidocaine. CT was performed in a supine position. After localizing the most appropriate target for pleural drainage, an 18 gauge trocar needle was advanced under CT guidance into the left pleural space. Fluid was aspirated. A guidewire was then advanced through the needle and the needle removed. The percutaneous tract was dilated over the guidewire. A 12 French pigtail drainage catheter was then advanced over the wire. After the catheter was formed, additional CT images were performed. The catheter was then connected to a [place] Pleur-Evac device. COMPLICATIONS: None FINDINGS: The largest loculation of residual left pleural fluid is in the lateral superior hemithorax. Aspiration yielded clear, yellowish fluid. Thorax. After pleural drain placement, there is return of yellowish colored clear fluid. IMPRESSION: CT-guided left chest thoracostomy tube  placement with placement of 12 French drain into the left pleural space. The site of loculated fluid in the superior and lateral aspect of the hemithorax was targeted for catheter placement. Electronically Signed   By: Aletta Edouard M.D.   On: 04/05/2016 15:54    Labs:  CBC:  Recent Labs  04/04/16 0405 04/05/16 0209 04/06/16 0215 04/07/16 0227  WBC 13.5* 10.6* 10.3 11.0*  HGB 9.0* 8.7* 9.5* 8.8*  HCT 30.3* 29.5* 32.1* 30.4*  PLT 74* 68* 84* 76*    COAGS:  Recent Labs  02/10/16 1314 04/03/16 1910  INR 1.10 1.19  APTT 31 35    BMP:  Recent Labs  04/04/16 0405 04/05/16 0209 04/06/16 0215 04/07/16 0227  NA 136 136 137 137  K 3.7 3.4* 3.7 3.3*  CL 107 105 107 104  CO2 21* 23 20* 24  GLUCOSE 165* 115* 103* 109*  BUN 13 11 10 7   CALCIUM 7.6* 7.7* 7.9* 8.0*  CREATININE 1.47* 1.46* 1.43* 1.34*  GFRNONAA 42* 43* 44* 47*  GFRAA 49* 49* 51* 55*    LIVER FUNCTION TESTS:  Recent Labs  04/03/16 1505 04/04/16 0124 04/04/16 0405 04/05/16 0209  BILITOT 0.3 0.3 0.5 0.5  AST 20 14* 14* 14*  ALT 17 14* 15* 14*  ALKPHOS 75 58 58 56  PROT 7.9 6.8 6.6 6.9  ALBUMIN 2.6* 2.1* 2.1* 2.0*  Assessment and Plan:  Loculated pleural effusion  S/P chest catheter placed by Dr. Kathlene Cote 10/16  Continue routine chest tube care   Will remove chest tube when OK with CT surgery   Electronically Signed: [facility] Sherre Wooton PA-C 04/07/2016, 10:30 AM   I spent a total of 15 Minutes at the the patient's bedside AND on the patient's hospital floor or unit, greater than 50% of which was counseling/coordinating care for chest tube follow up.

## 2016-04-07 NOTE — Care Management Important Message (Signed)
Important Message  Patient Details  Name: Ricardo Watkins MRN: [id number] Date of Birth: [birth year]   Medicare Important Message Given:  Yes    [name] 04/07/2016, 10:22 AM

## 2016-04-07 NOTE — Progress Notes (Signed)
PROGRESS NOTE  Ricardo Watkins:[id number] DOB: 1931-02-14 DOA: 04/03/2016 PCP: [name], MD   LOS: 4 days   Brief Narrative: 82 y.o.malewith medical history significant of non-Hodgkin's lymphoma, CAD, type 2 diabetes, hyperlipidemia, hypertension, anemia, prostate CA, diverticulosis, cataracts, DVT, chronic cough, chronic left pleural effusion with insertion of left pleural catheter on 11/10/2015 by [name] who comes to the hospital due to fever, chills, worsening fatigue and malaise. Per patient, his daughter and wife, his chest tube drainage has been decreasing his output for the pastseveral days and he stopped completely the day PTA. He normally has up to 500 mL of drainage every other day. Patient was worked up and found to be septic and have a loculated effusion. CT Surgery was consulted and is to undergo a Pigtail Catheter Drain by IR 10/16 and had ~550 mL out. Patient's old Pleur-X Cathether is still in place with plans for removal by TCV  Assessment & Plan: Principal Problem:   Empyema ([place]) Active Problems:   CAD (coronary artery disease)   History of lymphoma   Essential hypertension   Thrombocytopenia (HCC)   Microcytic anemia   Type 2 diabetes mellitus ([place])   Empyema lung (HCC)   Loculated Effusion/Empyema from Left Obstructed Pleural Catheter for recurrent and chronic Non Malignant Pleural Effusions s/p CT Guided Placement of Left 12 French Pigtail Pleural Drain, improving - Sepsis physiology resolved; Old Drain left in place and will need to be removed - continue IV Vancomycin and Zosyn for now. Unfortunately no labs obtained from pleural fluid to determine if infected or not.  - CT Scan showed Moderate-sized loculated left pleural effusion collection with a PleurX drainage catheter in place. - Cardiothoracic Surgery evaluated and will continue to follow; IR placed CT Guided Fluid Drain by PigTail Catheter 10/16 and drained ~550 mL  - Continue  Supplemental Oxygen and wean   Hx of Non-Hodgkins Lymphoma - PET-CT Scan showed Enlarging hypermetabolic mediastinal lymph nodes suggesting recurrent lymphoma. - Patient was seen by Hematologist/Oncologist [name], will have close outpatient follow up  - Continue scheduled follow-ups with Hematology/Oncology after empyema treatment.  MDS (myelodysplastic syndrome) with 5q deletion - Followed by Hematology/Oncology; Seen by [name] today - Continue Promacta.  Patient's Hematologist increased it to 75 mg and will reassess next visit.  Hypokalemia - Improved, replete prn  Insulin Dependent Type 2 Diabetes Mellitus  -Continue Lantus and SSI  CAD  - Continue Metoprolol 100 mg by mouth daily and Irbesartan 150 mg po daily - Clopidogrel was helped by Hematology/Oncology because of Thrombocytopenia.  Essential Hypertension - Continue Metoprolol 100 mg po daily and Irbesartan 150 mg po daily. - Monitor blood pressure, BUN/creatinine and electrolytes.  Thrombocytopenia  - Platelet Count was improved  - SCDs for DVT prophylaxis. - Continue Promacta.  Patient's Hematologist increased it to 75 mg and will reassess next visit. -Hematology recommended stopping Clopidogrel; P2Y12 from 10/15 WNL  Microcytic Anemia - C/w Ferrous Sulfate 325 mg tablet po  - monitor, Hb trending down, no s/s of bleeding. Transfuse for Hb < 7   DVT prophylaxis: SCD Code Status: Full Family Communication: no family bedside Disposition Plan: home when ready  Consultants:   Cardiothoracic surgery  IR  HemOnc  Procedures:   CT Guided IR Pigtail Cathether Drain  Antimicrobials:  Vancomycin 10/14 >>  Zosyn 10/14 >>  Subjective: - feels better than few days ago. No chest pain, no dyspnea  Objective: Vitals:   04/06/16 2119 04/07/16 0505 04/07/16 5916  04/07/16 1331  BP: (!) 157/59 (!) 163/85 (!) 162/67 123/60  Pulse: (!) 108 98 (!) 113 90  Resp: 18 18 (!) 24 (!) 23  Temp: 98.2  F (36.8 C) 98.2 F (36.8 C) 97.8 F (36.6 C) 98.2 F (36.8 C)  TempSrc: Oral Oral Oral Oral  SpO2: 95% 100% 96% 98%  Weight:      Height:        Intake/Output Summary (Last 24 hours) at 04/07/16 1530 Last data filed at 04/07/16 1300  Gross per 24 hour  Intake              820 ml  Output             1075 ml  Net             -255 ml   Filed Weights   04/03/16 1946 04/03/16 2026 04/04/16 1000  Weight: 85.3 kg (188 lb) 85.3 kg (188 lb) 85.5 kg (188 lb 9.6 oz)    Examination: Constitutional: NAD Vitals:   04/06/16 2119 04/07/16 0505 04/07/16 0927 04/07/16 1331  BP: (!) 157/59 (!) 163/85 (!) 162/67 123/60  Pulse: (!) 108 98 (!) 113 90  Resp: 18 18 (!) 24 (!) 23  Temp: 98.2 F (36.8 C) 98.2 F (36.8 C) 97.8 F (36.6 C) 98.2 F (36.8 C)  TempSrc: Oral Oral Oral Oral  SpO2: 95% 100% 96% 98%  Weight:      Height:       Eyes: PERRL, lids and conjunctivae normal ENMT: Mucous membranes are moist. Respiratory: decreased breath sounds at the bases, no wheezing, no crackles. Pigtail catheter in place Cardiovascular: Regular rate and rhythm, no murmurs / rubs / gallops. No LE edema Abdomen: no tenderness. Bowel sounds positive.  Musculoskeletal: no clubbing / cyanosis.  Neurologic: CN 2-12 grossly intact. Strength 5/5 in all 4.  Psychiatric: Normal judgment and insight. Alert and oriented x 3. Normal mood.    Data Reviewed: I have personally reviewed following labs and imaging studies  CBC:  Recent Labs Lab 04/03/16 1505 04/04/16 0405 04/05/16 0209 04/06/16 0215 04/07/16 0227  WBC 12.9* 13.5* 10.6* 10.3 11.0*  NEUTROABS 8.6* 8.1* 7.0 6.9 7.3  HGB 10.5* 9.0* 8.7* 9.5* 8.8*  HCT 34.2* 30.3* 29.5* 32.1* 30.4*  MCV 78.1 79.1 79.1 80.0 80.0  PLT 96* 74* 68* 84* 76*   Basic Metabolic Panel:  Recent Labs Lab 04/04/16 0124 04/04/16 0405 04/05/16 0209 04/06/16 0215 04/07/16 0227  NA 136 136 136 137 137  K 3.9 3.7 3.4* 3.7 3.3*  CL 107 107 105 107 104  CO2 21*  21* 23 20* 24  GLUCOSE 150* 165* 115* 103* 109*  BUN 13 13 11 10 7   CREATININE 1.45* 1.47* 1.46* 1.43* 1.34*  CALCIUM 7.5* 7.6* 7.7* 7.9* 8.0*  MG  --   --   --   --  1.9  PHOS  --   --   --   --  2.6   GFR: Estimated Creatinine Clearance: 43.7 mL/min (by C-G formula based on SCr of 1.34 mg/dL (H)). Liver Function Tests:  Recent Labs Lab 04/03/16 1505 04/04/16 0124 04/04/16 0405 04/05/16 0209  AST 20 14* 14* 14*  ALT 17 14* 15* 14*  ALKPHOS 75 58 58 56  BILITOT 0.3 0.3 0.5 0.5  PROT 7.9 6.8 6.6 6.9  ALBUMIN 2.6* 2.1* 2.1* 2.0*   No results for input(s): LIPASE, AMYLASE in the last 168 hours. No results for input(s): AMMONIA in the last 168  hours. Coagulation Profile:  Recent Labs Lab 04/03/16 1910  INR 1.19   Cardiac Enzymes: No results for input(s): CKTOTAL, CKMB, CKMBINDEX, TROPONINI in the last 168 hours. BNP (last 3 results)  Recent Labs  01/27/16 1119  PROBNP 311.0*   HbA1C: No results for input(s): HGBA1C in the last 72 hours. CBG:  Recent Labs Lab 04/06/16 1120 04/06/16 1706 04/06/16 2118 04/07/16 0634 04/07/16 1121  GLUCAP 128* 120* 149* 97 164*   Lipid Profile: No results for input(s): CHOL, HDL, LDLCALC, TRIG, CHOLHDL, LDLDIRECT in the last 72 hours. Thyroid Function Tests: No results for input(s): TSH, T4TOTAL, FREET4, T3FREE, THYROIDAB in the last 72 hours. Anemia Panel: No results for input(s): VITAMINB12, FOLATE, FERRITIN, TIBC, IRON, RETICCTPCT in the last 72 hours. Urine analysis:    Component Value Date/Time   COLORURINE AMBER (A) 04/03/2016 2007   APPEARANCEUR CLOUDY (A) 04/03/2016 2007   LABSPEC 1.022 04/03/2016 2007   PHURINE 5.0 04/03/2016 2007   GLUCOSEU NEGATIVE 04/03/2016 2007   HGBUR SMALL (A) 04/03/2016 2007   BILIRUBINUR NEGATIVE 04/03/2016 2007   Colon NEGATIVE 04/03/2016 2007   PROTEINUR 100 (A) 04/03/2016 2007   NITRITE NEGATIVE 04/03/2016 2007   LEUKOCYTESUR NEGATIVE 04/03/2016 2007   Sepsis Labs: Invalid  input(s): PROCALCITONIN, LACTICIDVEN  Recent Results (from the past 240 hour(s))  Blood Culture (routine x 2)     Status: None (Preliminary result)   Collection Time: 04/03/16  7:01 PM  Result Value Ref Range Status   Specimen Description LEFT ANTECUBITAL  Final   Special Requests BOTTLES DRAWN AEROBIC AND ANAEROBIC 5CC  Final   Culture NO GROWTH 4 DAYS  Final   Report Status PENDING  Incomplete  Blood Culture (routine x 2)     Status: None (Preliminary result)   Collection Time: 04/03/16  7:06 PM  Result Value Ref Range Status   Specimen Description RIGHT ANTECUBITAL  Final   Special Requests BOTTLES DRAWN AEROBIC AND ANAEROBIC 5CC  Final   Culture NO GROWTH 4 DAYS  Final   Report Status PENDING  Incomplete  Urine culture     Status: Abnormal   Collection Time: 04/03/16  8:07 PM  Result Value Ref Range Status   Specimen Description URINE, CLEAN CATCH  Final   Special Requests NONE  Final   Culture MULTIPLE SPECIES PRESENT, SUGGEST RECOLLECTION (A)  Final   Report Status 04/05/2016 FINAL  Final      Radiology Studies: Dg Chest 2 View  Result Date: 04/07/2016 CLINICAL DATA:  Left-sided chest tube, weakness, history of recurrent 9 malignant left pleural effusion. Also history of lymphoma EXAM: CHEST  2 VIEW COMPARISON:  Chest x-ray of 04/06/2016 and CT chest of 04/03/2016 FINDINGS: The left chest tube remains and no residual pneumothorax is seen. Apparently loculated left pleural effusion is unchanged. The pigtail of the catheter lies just superior to the superior extent of the apparently loculated effusion. Mild bibasilar atelectasis is present. Heart size is stable. IMPRESSION: 1. No pneumothorax is currently seen. 2. No change in left chest tube and apparently loculated left pleural effusion. Electronically Signed   By: Ivar Drape M.D.   On: 04/07/2016 13:23   Dg Chest Port 1 View  Addendum Date: 04/07/2016   ADDENDUM REPORT: 04/07/2016 08:27 ADDENDUM: Critical Value/emergent  results were called by telephone at the time of interpretation on 04/07/2016 at 8:27 am to the patient's interventional radiologist, who verbally acknowledged these results. Electronically Signed   By: Marcello Moores  Register   On: 04/07/2016 08:27  Result Date: 04/07/2016 CLINICAL DATA:  Shortness of breath. EXAM: PORTABLE CHEST 1 VIEW COMPARISON:  CT 04/05/2016. FINDINGS: Left chest tube noted. Previously identified large left left up pleural effusion is been removed. Residual left lower pleural effusion. Tiny left pneumothorax present. This was discussed with the patient's interventional radiologist. Stable cardiomegaly. No acute bony abnormality. IMPRESSION: Left chest tube noted in good anatomic position. Significant resolution of left-sided pneumothorax. Residual left lower pleural effusion present. Miniscule left pneumothorax noted. Electronically Signed: ByMarcello Moores  Register On: 04/06/2016 16:30     Scheduled Meds: . Derrill Memo ON 05/01/2016] cyanocobalamin  1,000 mcg Intramuscular Q30 days  . dextromethorphan  30 mg Oral QHS  . doxazosin  4 mg Oral QHS  . eltrombopag  75 mg Oral QAC lunch  . ferrous sulfate  325 mg Oral QPC supper  . fluticasone  1 spray Each Nare QHS  . guaiFENesin  600 mg Oral BID  . insulin aspart  0-15 Units Subcutaneous TID WC  . insulin glargine  15 Units Subcutaneous Daily  . irbesartan  150 mg Oral Daily  . metoprolol succinate  100 mg Oral Daily  . multivitamin with minerals  1 tablet Oral QHS  . piperacillin-tazobactam (ZOSYN)  IV  3.375 g Intravenous Q8H  . sodium chloride flush  3 mL Intravenous Q12H  . vancomycin  1,250 mg Intravenous Q24H   Continuous Infusions: . sodium chloride 75 mL/hr at 04/07/16 [place], MD, PhD [facility] Pager [phone]  If 7PM-7AM, please contact night-coverage [email or web address] Password [email or web address] 04/07/2016, 3:30 PM

## 2016-04-07 NOTE — Progress Notes (Addendum)
[id number]       [place] [address]             [phone]        Procedure(s) (LRB): REMOVAL OF PLEURAL DRAINAGE CATHETER (Left) POSSIBLE CHEST TUBE INSERTION (Left) Subjective: Breathing is comfortable , not SOB at current level of activity Frequent PVC's, some afib  Objective: Vital signs in last 24 hours: Temp:  [98.2 F (36.8 C)-98.5 F (36.9 C)] 98.2 F (36.8 C) (10/18 0505) Pulse Rate:  [98-108] 98 (10/18 0505) Cardiac Rhythm: Normal sinus rhythm (10/18 0700) Resp:  [18] 18 (10/18 0505) BP: (143-163)/(56-85) 163/85 (10/18 0505) SpO2:  [93 %-100 %] 100 % (10/18 0505)  Hemodynamic parameters for last 24 hours:    Intake/Output from previous day: 10/17 0701 - 10/18 0700 In: 960 [P.O.:960] Out: 1375 [Urine:1325; Chest Tube:50] Intake/Output this shift: No intake/output data recorded.  General appearance: alert, cooperative and no distress Heart: irregularly irregular rhythm Lungs: dim BS L>R Abdomen: benign Extremities: min edema  Lab Results:  Recent Labs  04/06/16 0215 04/07/16 0227  WBC 10.3 11.0*  HGB 9.5* 8.8*  HCT 32.1* 30.4*  PLT 84* 76*   BMET:  Recent Labs  04/06/16 0215 04/07/16 0227  NA 137 137  K 3.7 3.3*  CL 107 104  CO2 20* 24  GLUCOSE 103* 109*  BUN 10 7  CREATININE 1.43* 1.34*  CALCIUM 7.9* 8.0*    PT/INR: No results for input(s): LABPROT, INR in the last 72 hours. ABG No results found for: PHART, HCO3, TCO2, ACIDBASEDEF, O2SAT CBG (last 3)   Recent Labs  04/06/16 1706 04/06/16 2118 04/07/16 0634  GLUCAP 120* 149* 97    Meds Scheduled Meds: . [START ON 05/01/2016] cyanocobalamin  1,000 mcg Intramuscular Q30 days  . dextromethorphan  30 mg Oral QHS  . doxazosin  4 mg Oral QHS  . eltrombopag  75 mg Oral QAC lunch  . ferrous sulfate  325 mg Oral QPC supper  . fluticasone  1 spray Each Nare QHS  . guaiFENesin  600 mg Oral BID  . insulin aspart  0-15 Units Subcutaneous TID WC  . insulin  glargine  15 Units Subcutaneous Daily  . irbesartan  150 mg Oral Daily  . metoprolol succinate  100 mg Oral Daily  . multivitamin with minerals  1 tablet Oral QHS  . piperacillin-tazobactam (ZOSYN)  IV  3.375 g Intravenous Q8H  . sodium chloride flush  3 mL Intravenous Q12H  . vancomycin  1,250 mg Intravenous Q24H   Continuous Infusions: . sodium chloride 75 mL/hr at 04/06/16 1128   PRN Meds:.acetaminophen, albuterol, meclizine, metoCLOPramide, ondansetron **OR** ondansetron (ZOFRAN) IV  Xrays Dg Chest Port 1 View  Addendum Date: 04/07/2016   ADDENDUM REPORT: 04/07/2016 08:27 ADDENDUM: Critical Value/emergent results were called by telephone at the time of interpretation on 04/07/2016 at 8:27 am to the patient's interventional radiologist, who verbally acknowledged these results. Electronically Signed   By: Marcello Moores  Register   On: 04/07/2016 08:27   Result Date: 04/07/2016 CLINICAL DATA:  Shortness of breath. EXAM: PORTABLE CHEST 1 VIEW COMPARISON:  CT 04/05/2016. FINDINGS: Left chest tube noted. Previously identified large left left up pleural effusion is been removed. Residual left lower pleural effusion. Tiny left pneumothorax present. This was discussed with the patient's interventional radiologist. Stable cardiomegaly. No acute bony abnormality. IMPRESSION: Left chest tube noted in good anatomic position. Significant resolution of left-sided pneumothorax. Residual left lower pleural effusion present. Miniscule left  pneumothorax noted. Electronically Signed: By: Marcello Moores  Register On: 04/06/2016 16:30   Ct Image Guided Fluid Drain By Catheter  Result Date: 04/05/2016 CLINICAL DATA:  Lymphoma, and left-sided empyema with residual loculated left pleural fluid remaining. Request has been made to place a percutaneous pleural drainage catheter. EXAM: CT GUIDED LEFT THORACOSTOMY TUBE PLACEMENT FOR EMPYEMA ANESTHESIA/SEDATION: 1.0 mg IV Versed 25 mcg IV Fentanyl Total Moderate Sedation Time:  20  minutes The patient's level of consciousness and physiologic status were continuously monitored during the procedure by Radiology nursing. PROCEDURE: The procedure, risks, benefits, and alternatives were explained to the patient's wife. Questions regarding the procedure were encouraged and answered. The patient's wife understands and consents to the procedure. A time out was performed prior to initiating the procedure. The left upper chest wall was prepped with chlorhexidine in a sterile fashion, and a sterile drape was applied covering the operative field. A sterile gown and sterile gloves were used for the procedure. Local anesthesia was provided with 1% Lidocaine. CT was performed in a supine position. After localizing the most appropriate target for pleural drainage, an 18 gauge trocar needle was advanced under CT guidance into the left pleural space. Fluid was aspirated. A guidewire was then advanced through the needle and the needle removed. The percutaneous tract was dilated over the guidewire. A 12 French pigtail drainage catheter was then advanced over the wire. After the catheter was formed, additional CT images were performed. The catheter was then connected to a [place] Pleur-Evac device. COMPLICATIONS: None FINDINGS: The largest loculation of residual left pleural fluid is in the lateral superior hemithorax. Aspiration yielded clear, yellowish fluid. Thorax. After pleural drain placement, there is return of yellowish colored clear fluid. IMPRESSION: CT-guided left chest thoracostomy tube placement with placement of 12 French drain into the left pleural space. The site of loculated fluid in the superior and lateral aspect of the hemithorax was targeted for catheter placement. Electronically Signed   By: Aletta Edouard M.D.   On: 04/05/2016 15:54    Assessment/Plan: S/P Procedure(s) (LRB): REMOVAL OF PLEURAL DRAINAGE CATHETER (Left) POSSIBLE CHEST TUBE INSERTION (Left)  1 stable with 50 cc  drainage since pigtail- no air leak appreciated 2 plans as outlined for future CT placement 3 medical management per primary svc  LOS: 4 days    GOLD,WAYNE E 04/07/2016 Plan removal of left Pleurx catheter and possible placement of new chest tube into left basilar loculated effusion in the operating room tomorrow. Procedure discussed with patient and family

## 2016-04-08 ENCOUNTER — Inpatient Hospital Stay (HOSPITAL_COMMUNITY): Payer: Medicare Other | Admitting: Certified Registered"

## 2016-04-08 ENCOUNTER — Inpatient Hospital Stay (HOSPITAL_COMMUNITY): Payer: Medicare Other

## 2016-04-08 ENCOUNTER — Encounter (HOSPITAL_COMMUNITY): Payer: Self-pay | Admitting: Certified Registered"

## 2016-04-08 ENCOUNTER — Encounter (HOSPITAL_COMMUNITY)
Admission: EM | Disposition: A | Discharge status: Home Health | Payer: Self-pay | Source: Home / Self Care | Attending: Internal Medicine

## 2016-04-08 DIAGNOSIS — J90 Pleural effusion, not elsewhere classified: Secondary | ICD-10-CM | POA: Diagnosis not present

## 2016-04-08 HISTORY — PX: CHEST TUBE INSERTION: SHX231

## 2016-04-08 HISTORY — PX: REMOVAL OF PLEURAL DRAINAGE CATHETER: SHX5080

## 2016-04-08 LAB — CULTURE, BLOOD (ROUTINE X 2)
Culture: NO GROWTH
Culture: NO GROWTH

## 2016-04-08 LAB — CBC WITH DIFFERENTIAL/PLATELET
BASOS PCT: 0 %
Basophils Absolute: 0 10*3/uL (ref 0.0–0.1)
EOS PCT: 1 %
Eosinophils Absolute: 0.1 10*3/uL (ref 0.0–0.7)
HEMATOCRIT: 28.9 % — AB (ref 39.0–52.0)
Hemoglobin: 8.5 g/dL — ABNORMAL LOW (ref 13.0–17.0)
LYMPHS PCT: 13 %
Lymphs Abs: 1.5 10*3/uL (ref 0.7–4.0)
MCH: 23.4 pg — ABNORMAL LOW (ref 26.0–34.0)
MCHC: 29.4 g/dL — ABNORMAL LOW (ref 30.0–36.0)
MCV: 79.4 fL (ref 78.0–100.0)
Monocytes Absolute: 1.7 10*3/uL — ABNORMAL HIGH (ref 0.1–1.0)
Monocytes Relative: 14 %
NEUTROS PCT: 72 %
Neutro Abs: 8.6 10*3/uL — ABNORMAL HIGH (ref 1.7–7.7)
Platelets: 74 10*3/uL — ABNORMAL LOW (ref 150–400)
RBC: 3.64 MIL/uL — ABNORMAL LOW (ref 4.22–5.81)
RDW: 23.3 % — ABNORMAL HIGH (ref 11.5–15.5)
WBC: 11.9 10*3/uL — ABNORMAL HIGH (ref 4.0–10.5)

## 2016-04-08 LAB — GLUCOSE, CAPILLARY
GLUCOSE-CAPILLARY: 108 mg/dL — AB (ref 65–99)
GLUCOSE-CAPILLARY: 99 mg/dL (ref 65–99)
GLUCOSE-CAPILLARY: 98 mg/dL (ref 65–99)
Glucose-Capillary: 98 mg/dL (ref 65–99)
Glucose-Capillary: 170 mg/dL — ABNORMAL HIGH (ref 65–99)

## 2016-04-08 LAB — BASIC METABOLIC PANEL
ANION GAP: 6 (ref 5–15)
BUN: 8 mg/dL (ref 6–20)
CHLORIDE: 107 mmol/L (ref 101–111)
CO2: 26 mmol/L (ref 22–32)
Calcium: 7.7 mg/dL — ABNORMAL LOW (ref 8.9–10.3)
Creatinine, Ser: 1.35 mg/dL — ABNORMAL HIGH (ref 0.61–1.24)
GFR calc Af Amer: 54 mL/min — ABNORMAL LOW (ref >60)
GFR calc non Af Amer: 47 mL/min — ABNORMAL LOW (ref >60)
GLUCOSE: 100 mg/dL — AB (ref 65–99)
POTASSIUM: 3.1 mmol/L — AB (ref 3.5–5.1)
Sodium: 139 mmol/L (ref 135–145)

## 2016-04-08 LAB — SURGICAL PCR SCREEN
MRSA, PCR: NEGATIVE
Staphylococcus aureus: NEGATIVE

## 2016-04-08 LAB — VANCOMYCIN, TROUGH: Vancomycin Tr: 37 ug/mL (ref 15–20)

## 2016-04-08 SURGERY — REMOVAL, CLOSED DRAINAGE CATHETER SYSTEM, PLEURAL
Anesthesia: Monitor Anesthesia Care | Laterality: Left

## 2016-04-08 MED ORDER — HYDROMORPHONE HCL 2 MG/ML IJ SOLN
0.2500 mg | INTRAMUSCULAR | Status: DC | PRN
  Administered 2016-04-08 (×4): 0.5 mg via INTRAVENOUS

## 2016-04-08 MED ORDER — DEXMEDETOMIDINE HCL IN NACL 200 MCG/50ML IV SOLN
INTRAVENOUS | Status: AC
  Filled 2016-04-08: qty 50

## 2016-04-08 MED ORDER — LIDOCAINE HCL (PF) 1 % IJ SOLN
INTRAMUSCULAR | Status: AC
  Filled 2016-04-08: qty 30

## 2016-04-08 MED ORDER — FUROSEMIDE 10 MG/ML IJ SOLN
INTRAMUSCULAR | Status: AC
  Administered 2016-04-08: 17:00:00
  Filled 2016-04-08: qty 4

## 2016-04-08 MED ORDER — HYDROCODONE-ACETAMINOPHEN 5-325 MG PO TABS
1.0000 | ORAL_TABLET | ORAL | Status: DC | PRN
Start: 2016-04-08 — End: 2016-04-20
  Administered 2016-04-09 – 2016-04-17 (×20): 1 via ORAL
  Filled 2016-04-08 (×20): qty 1

## 2016-04-08 MED ORDER — OXYCODONE HCL 5 MG/5ML PO SOLN: 5.0000 mg | Freq: Once | ORAL | Status: DC | PRN

## 2016-04-08 MED ORDER — LACTATED RINGERS IV SOLN
INTRAVENOUS | Status: DC | PRN
  Administered 2016-04-08: 11:00:00 via INTRAVENOUS

## 2016-04-08 MED ORDER — LIDOCAINE HCL (PF) 1 % IJ SOLN
INTRAMUSCULAR | Status: DC | PRN
  Administered 2016-04-08: 15 mL

## 2016-04-08 MED ORDER — HYDROMORPHONE HCL 2 MG/ML IJ SOLN
INTRAMUSCULAR | Status: AC
  Filled 2016-04-08: qty 1

## 2016-04-08 MED ORDER — DEXMEDETOMIDINE HCL IN NACL 200 MCG/50ML IV SOLN
INTRAVENOUS | Status: DC | PRN
  Administered 2016-04-08: 8 ug via INTRAVENOUS
  Administered 2016-04-08: 4 ug via INTRAVENOUS
  Administered 2016-04-08 (×5): 8 ug via INTRAVENOUS
  Administered 2016-04-08: 16 ug via INTRAVENOUS
  Administered 2016-04-08: 4 ug via INTRAVENOUS
  Administered 2016-04-08 (×5): 8 ug via INTRAVENOUS

## 2016-04-08 MED ORDER — OXYCODONE HCL 5 MG PO TABS: 5.0000 mg | ORAL_TABLET | Freq: Once | ORAL | Status: DC | PRN

## 2016-04-08 MED ORDER — 0.9 % SODIUM CHLORIDE (POUR BTL) OPTIME
TOPICAL | Status: DC | PRN
  Administered 2016-04-08: 1000 mL

## 2016-04-08 MED ORDER — POTASSIUM CHLORIDE CRYS ER 20 MEQ PO TBCR
40.0000 meq | EXTENDED_RELEASE_TABLET | Freq: Once | ORAL | Status: AC
  Administered 2016-04-08: 40 meq via ORAL
  Filled 2016-04-08: qty 2

## 2016-04-08 MED ORDER — ONDANSETRON HCL 4 MG/2ML IJ SOLN: 4.0000 mg | Freq: Once | INTRAMUSCULAR | Status: DC | PRN

## 2016-04-08 MED ORDER — LIP MEDEX EX OINT
TOPICAL_OINTMENT | CUTANEOUS | Status: DC | PRN
  Filled 2016-04-08: qty 7

## 2016-04-08 MED ORDER — LACTATED RINGERS IV SOLN
Freq: Once | INTRAVENOUS | Status: AC
  Administered 2016-04-08: 11:00:00 via INTRAVENOUS

## 2016-04-08 SURGICAL SUPPLY — 39 items
BLADE SURG 11 STRL SS (BLADE) ×3 IMPLANT
BLADE SURG 15 STRL LF DISP TIS (BLADE) ×1 IMPLANT
BLADE SURG 15 STRL SS (BLADE) ×2
CANISTER SUCT 3000ML PPV (MISCELLANEOUS) ×3 IMPLANT
CATH THORACIC 28FR (CATHETERS) IMPLANT
CATH THORACIC 36FR (CATHETERS) IMPLANT
CONT SPEC 4OZ CLIKSEAL STRL BL (MISCELLANEOUS) ×3 IMPLANT
COVER MAYO STAND STRL (DRAPES) ×3 IMPLANT
COVER SURGICAL LIGHT HANDLE (MISCELLANEOUS) ×3 IMPLANT
COVER TABLE BACK 60X90 (DRAPES) ×3 IMPLANT
DRAPE CHEST BREAST 15X10 FENES (DRAPES) ×3 IMPLANT
GAUZE SPONGE 4X4 12PLY STRL (GAUZE/BANDAGES/DRESSINGS) ×3 IMPLANT
GAUZE SPONGE 4X4 16PLY XRAY LF (GAUZE/BANDAGES/DRESSINGS) IMPLANT
GLOVE BIO SURGEON STRL SZ7.5 (GLOVE) ×3 IMPLANT
GOWN STRL REUS W/ TWL LRG LVL3 (GOWN DISPOSABLE) ×2 IMPLANT
GOWN STRL REUS W/TWL LRG LVL3 (GOWN DISPOSABLE) ×4
KIT BASIN OR (CUSTOM PROCEDURE TRAY) ×3 IMPLANT
KIT PLEURX DRAIN CATH 1000ML (MISCELLANEOUS) ×3 IMPLANT
KIT ROOM TURNOVER OR (KITS) ×3 IMPLANT
NEEDLE HYPO 25GX1X1/2 BEV (NEEDLE) ×3 IMPLANT
PAD ARMBOARD 7.5X6 YLW CONV (MISCELLANEOUS) ×6 IMPLANT
SET DRAINAGE LINE (MISCELLANEOUS) ×3 IMPLANT
SPONGE LAP 18X18 X RAY DECT (DISPOSABLE) ×3 IMPLANT
STOPCOCK 4 WAY LG BORE MALE ST (IV SETS) ×3 IMPLANT
SUT ETHILON 3 0 PS 1 (SUTURE) ×6 IMPLANT
SUT SILK  1 MH (SUTURE) ×2
SUT SILK 1 MH (SUTURE) ×1 IMPLANT
SUT VIC AB 2-0 CT1 27 (SUTURE) ×2
SUT VIC AB 2-0 CT1 TAPERPNT 27 (SUTURE) ×1 IMPLANT
SYR 20CC LL (SYRINGE) ×6 IMPLANT
SYR CONTROL 10ML LL (SYRINGE) ×3 IMPLANT
SYSTEM SAHARA CHEST DRAIN RE-I (WOUND CARE) ×3 IMPLANT
TAPE CLOTH SURG 4X10 WHT LF (GAUZE/BANDAGES/DRESSINGS) ×3 IMPLANT
TOWEL OR 17X24 6PK STRL BLUE (TOWEL DISPOSABLE) ×3 IMPLANT
TOWEL OR 17X26 10 PK STRL BLUE (TOWEL DISPOSABLE) ×3 IMPLANT
TRAP SPECIMEN MUCOUS 40CC (MISCELLANEOUS) IMPLANT
TRAY CHEST TUBE INSERTION (SET/KITS/TRAYS/PACK) ×3 IMPLANT
TUBE CONNECTING 12'X1/4 (SUCTIONS) ×1
TUBE CONNECTING 12X1/4 (SUCTIONS) ×2 IMPLANT

## 2016-04-08 NOTE — Progress Notes (Signed)
Pharmacy Antibiotic Note  Ricardo Watkins is a 82 y.o. male admitted on 04/03/2016 with sepsis.  Continues on Vancomycin and Zosyn Day # 6, s/p OR today for removal of old pleural catheter, and chest tube insertion. Scr stable, afebrile, WBC remains mildly elevated Cultures negative to date. Vancomycin trough = 37, was drawn after the 1800 dose was given, which explains the high level.     Plan: Vancomycin 1250mg  IV q24h - consider stopping? Zosyn 3.375g IV q8h extended infusion Monitor renal function   Height: 5\' 7"  (170.2 cm) Weight: 188 lb 9.6 oz (85.5 kg) IBW/kg (Calculated) : 66.1  Temp (24hrs), Avg:98.1 F (36.7 C), Min:97.6 F (36.4 C), Max:98.7 F (37.1 C)   Recent Labs Lab 04/03/16 1525 04/03/16 1924  04/04/16 0405 04/05/16 0209 04/06/16 0215 04/07/16 0227 04/08/16 0231 04/08/16 1819  WBC  --   --   --  13.5* 10.6* 10.3 11.0* 11.9*  --   CREATININE  --   --   < > 1.47* 1.46* 1.43* 1.34* 1.35*  --   LATICACIDVEN 1.88 1.15  --   --   --   --   --   --   --   VANCOTROUGH  --   --   --   --   --   --   --   --  37*  < > = values in this interval not displayed.  Estimated Creatinine Clearance: 43.3 mL/min (by C-G formula based on SCr of 1.35 mg/dL (H)).    Allergies  Allergen Reactions  . Glucophage [Metformin Hydrochloride] Other (See Comments)    Chest pain  . Zetia [Ezetimibe] Other (See Comments)    weakness  . Fenofibrate Rash  . Niacin-Lovastatin Er Rash    Antimicrobials this admission:  Vanc 10/14 >>  Zosyn 10/14 >>   Microbiology results:  10/14 BCx: Neg 10/14 UCx: Neg 10/19 pleural fluid:  Thank you for allowing pharmacy to be a part of this patient's care. Anette Guarneri, PharmD [phone]  04/08/2016, 8:53 PM

## 2016-04-08 NOTE — Progress Notes (Signed)
PROGRESS NOTE  Ricardo Watkins WER:[id number] DOB: [birth year] DOA: 04/03/2016 PCP: [name], MD   LOS: 5 days   Brief Narrative: 82 y.o.malewith medical history significant of non-Hodgkin's lymphoma, CAD, type 2 diabetes, hyperlipidemia, hypertension, anemia, prostate CA, diverticulosis, cataracts, DVT, chronic cough, chronic left pleural effusion with insertion of left pleural catheter on 11/10/2015 by [name] who comes to the hospital due to fever, chills, worsening fatigue and malaise. Per patient, his daughter and wife, his chest tube drainage has been decreasing his output for the pastseveral days and he stopped completely the day PTA. He normally has up to 500 mL of drainage every other day. Patient was worked up and found to be septic and have a loculated effusion. CT Surgery was consulted and is to undergo a Pigtail Catheter Drain by IR 10/16 and had ~550 mL out. Patient's old Pleur-X Cathether is still in place with plans for removal by TCV  Assessment & Plan: Principal Problem:   Empyema ([place]) Active Problems:   CAD (coronary artery disease)   History of lymphoma   Essential hypertension   Thrombocytopenia (HCC)   Microcytic anemia   Type 2 diabetes mellitus ([place])   Empyema lung (HCC)   Loculated Effusion/Empyema from Left Obstructed Pleural Catheter for recurrent and chronic Non Malignant Pleural Effusions s/p CT Guided Placement of Left 12 French Pigtail Pleural Drain, improving - Sepsis physiology resolved; Old Drain left in place and will need to be removed, plan for 10/19 - continue IV Vancomycin and Zosyn for now. Unfortunately no labs obtained from pleural fluid initiallt to determine if infected or not.  - CT Scan showed Moderate-sized loculated left pleural effusion collection with a PleurX drainage catheter in place. - Cardiothoracic Surgery evaluated and will continue to follow; IR placed CT Guided Fluid Drain by PigTail Catheter 10/16 and drained ~550  mL  - Continue Supplemental Oxygen and wean  - OR today for removal of old pleural catheter, possible chest tube insertion  Hx of Non-Hodgkins Lymphoma - PET-CT Scan showed Enlarging hypermetabolic mediastinal lymph nodes suggesting recurrent lymphoma. - Patient was seen by Hematologist/Oncologist [name], will have close outpatient follow up  - Continue scheduled follow-ups with Hematology/Oncology after empyema treatment.  MDS (myelodysplastic syndrome) with 5q deletion - Followed by Hematology/Oncology; Seen by [name] today - Continue Promacta. Patient's Hematologist increased it to 75 mg and will reassess next visit.  Hypokalemia - Improved, replete prn  Insulin Dependent Type 2 Diabetes Mellitus  -Continue Lantus and SSI  CAD  - Continue Metoprolol 100 mg by mouth daily and Irbesartan 150 mg po daily - Clopidogrel was helped by Hematology/Oncology because of Thrombocytopenia.  Essential Hypertension - Continue Metoprolol 100 mg po daily and Irbesartan 150 mg po daily. - Monitor blood pressure, BUN/creatinine and electrolytes. Stable today   Thrombocytopenia  - Platelet Count was improved  - SCDs for DVT prophylaxis. - Continue Promacta.  Patient's Hematologist increased it to 75 mg and will reassess next visit. - Hematology recommended stopping Clopidogrel; P2Y12 from 10/15 WNL  Microcytic Anemia - C/w Ferrous Sulfate 325 mg tablet po  - monitor, Hb trending down, no s/s of bleeding. Transfuse for Hb < 7   DVT prophylaxis: SCD Code Status: Full Family Communication: no family bedside Disposition Plan: home when ready  Consultants:   Cardiothoracic surgery  IR  HemOnc  Procedures:   CT Guided IR Pigtail Cathether Drain  Antimicrobials:  Vancomycin 10/14 >>  Zosyn 10/14 >>  Subjective: - feels better  than few days ago. Awaiting OR. Mild dyspnea with movement. No chest pain / palpitations  Objective: Vitals:   04/07/16 1331 04/07/16 2128  04/08/16 0614 04/08/16 0648  BP: 123/60 (!) 142/80 (!) 181/86 (!) 153/68  Pulse: 90 96 (!) 114   Resp: (!) 23 (!) 22 (!) 23   Temp: 98.2 F (36.8 C) 97.6 F (36.4 C) 98.3 F (36.8 C)   TempSrc: Oral Oral Oral   SpO2: 98% 95% 97%   Weight:      Height:        Intake/Output Summary (Last 24 hours) at 04/08/16 0814 Last data filed at 04/08/16 0645  Gross per 24 hour  Intake              580 ml  Output              950 ml  Net             -370 ml   Filed Weights   04/03/16 1946 04/03/16 2026 04/04/16 1000  Weight: 85.3 kg (188 lb) 85.3 kg (188 lb) 85.5 kg (188 lb 9.6 oz)    Examination: Constitutional: NAD Vitals:   04/07/16 1331 04/07/16 2128 04/08/16 0614 04/08/16 0648  BP: 123/60 (!) 142/80 (!) 181/86 (!) 153/68  Pulse: 90 96 (!) 114   Resp: (!) 23 (!) 22 (!) 23   Temp: 98.2 F (36.8 C) 97.6 F (36.4 C) 98.3 F (36.8 C)   TempSrc: Oral Oral Oral   SpO2: 98% 95% 97%   Weight:      Height:       Eyes: PERRL, lids and conjunctivae normal ENMT: Mucous membranes are moist. Respiratory: decreased breath sounds at the bases, no wheezing, no crackles. Pigtail catheter in place Cardiovascular: Regular rate and rhythm, 3/6 SEM. No LE edema Abdomen: no tenderness. Bowel sounds positive.  Musculoskeletal: no clubbing / cyanosis.  Neurologic: non focal Psychiatric: Normal judgment and insight. Alert and oriented x 3. Normal mood.    Data Reviewed: I have personally reviewed following labs and imaging studies  CBC:  Recent Labs Lab 04/04/16 0405 04/05/16 0209 04/06/16 0215 04/07/16 0227 04/08/16 0231  WBC 13.5* 10.6* 10.3 11.0* 11.9*  NEUTROABS 8.1* 7.0 6.9 7.3 8.6*  HGB 9.0* 8.7* 9.5* 8.8* 8.5*  HCT 30.3* 29.5* 32.1* 30.4* 28.9*  MCV 79.1 79.1 80.0 80.0 79.4  PLT 74* 68* 84* 76* 74*   Basic Metabolic Panel:  Recent Labs Lab 04/04/16 0405 04/05/16 0209 04/06/16 0215 04/07/16 0227 04/08/16 0231  NA 136 136 137 137 139  K 3.7 3.4* 3.7 3.3* 3.1*  CL 107  105 107 104 107  CO2 21* 23 20* 24 26  GLUCOSE 165* 115* 103* 109* 100*  BUN 13 11 10 7 8   CREATININE 1.47* 1.46* 1.43* 1.34* 1.35*  CALCIUM 7.6* 7.7* 7.9* 8.0* 7.7*  MG  --   --   --  1.9  --   PHOS  --   --   --  2.6  --    GFR: Estimated Creatinine Clearance: 43.3 mL/min (by C-G formula based on SCr of 1.35 mg/dL (H)). Liver Function Tests:  Recent Labs Lab 04/03/16 1505 04/04/16 0124 04/04/16 0405 04/05/16 0209  AST 20 14* 14* 14*  ALT 17 14* 15* 14*  ALKPHOS 75 58 58 56  BILITOT 0.3 0.3 0.5 0.5  PROT 7.9 6.8 6.6 6.9  ALBUMIN 2.6* 2.1* 2.1* 2.0*   No results for input(s): LIPASE, AMYLASE in the last 168 hours.  No results for input(s): AMMONIA in the last 168 hours. Coagulation Profile:  Recent Labs Lab 04/03/16 1910  INR 1.19   Cardiac Enzymes: No results for input(s): CKTOTAL, CKMB, CKMBINDEX, TROPONINI in the last 168 hours. BNP (last 3 results)  Recent Labs  01/27/16 1119  PROBNP 311.0*   HbA1C: No results for input(s): HGBA1C in the last 72 hours. CBG:  Recent Labs Lab 04/07/16 0634 04/07/16 1121 04/07/16 1647 04/07/16 2123 04/08/16 0613  GLUCAP 97 164* 225* 159* 108*   Lipid Profile: No results for input(s): CHOL, HDL, LDLCALC, TRIG, CHOLHDL, LDLDIRECT in the last 72 hours. Thyroid Function Tests: No results for input(s): TSH, T4TOTAL, FREET4, T3FREE, THYROIDAB in the last 72 hours. Anemia Panel: No results for input(s): VITAMINB12, FOLATE, FERRITIN, TIBC, IRON, RETICCTPCT in the last 72 hours. Urine analysis:    Component Value Date/Time   COLORURINE AMBER (A) 04/03/2016 2007   APPEARANCEUR CLOUDY (A) 04/03/2016 2007   LABSPEC 1.022 04/03/2016 2007   PHURINE 5.0 04/03/2016 2007   GLUCOSEU NEGATIVE 04/03/2016 2007   HGBUR SMALL (A) 04/03/2016 2007   BILIRUBINUR NEGATIVE 04/03/2016 2007   [place] NEGATIVE 04/03/2016 2007   PROTEINUR 100 (A) 04/03/2016 2007   NITRITE NEGATIVE 04/03/2016 2007   LEUKOCYTESUR NEGATIVE 04/03/2016 2007     Sepsis Labs: Invalid input(s): PROCALCITONIN, LACTICIDVEN  Recent Results (from the past 240 hour(s))  Blood Culture (routine x 2)     Status: None (Preliminary result)   Collection Time: 04/03/16  7:01 PM  Result Value Ref Range Status   Specimen Description LEFT ANTECUBITAL  Final   Special Requests BOTTLES DRAWN AEROBIC AND ANAEROBIC 5CC  Final   Culture NO GROWTH 4 DAYS  Final   Report Status PENDING  Incomplete  Blood Culture (routine x 2)     Status: None (Preliminary result)   Collection Time: 04/03/16  7:06 PM  Result Value Ref Range Status   Specimen Description RIGHT ANTECUBITAL  Final   Special Requests BOTTLES DRAWN AEROBIC AND ANAEROBIC 5CC  Final   Culture NO GROWTH 4 DAYS  Final   Report Status PENDING  Incomplete  Urine culture     Status: Abnormal   Collection Time: 04/03/16  8:07 PM  Result Value Ref Range Status   Specimen Description URINE, CLEAN CATCH  Final   Special Requests NONE  Final   Culture MULTIPLE SPECIES PRESENT, SUGGEST RECOLLECTION (A)  Final   Report Status 04/05/2016 FINAL  Final      Radiology Studies: Dg Chest 2 View  Result Date: 04/07/2016 CLINICAL DATA:  Left-sided chest tube, weakness, history of recurrent 9 malignant left pleural effusion. Also history of lymphoma EXAM: CHEST  2 VIEW COMPARISON:  Chest x-ray of 04/06/2016 and CT chest of 04/03/2016 FINDINGS: The left chest tube remains and no residual pneumothorax is seen. Apparently loculated left pleural effusion is unchanged. The pigtail of the catheter lies just superior to the superior extent of the apparently loculated effusion. Mild bibasilar atelectasis is present. Heart size is stable. IMPRESSION: 1. No pneumothorax is currently seen. 2. No change in left chest tube and apparently loculated left pleural effusion. Electronically Signed   By: Ivar Drape M.D.   On: 04/07/2016 13:23   Dg Chest Port 1 View  Addendum Date: 04/07/2016   ADDENDUM REPORT: 04/07/2016 08:27 ADDENDUM:  Critical Value/emergent results were called by telephone at the time of interpretation on 04/07/2016 at 8:27 am to the patient's interventional radiologist, who verbally acknowledged these results. Electronically Signed  By: [place]   On: 04/07/2016 08:27   Result Date: 04/07/2016 CLINICAL DATA:  Shortness of breath. EXAM: PORTABLE CHEST 1 VIEW COMPARISON:  CT 04/05/2016. FINDINGS: Left chest tube noted. Previously identified large left left up pleural effusion is been removed. Residual left lower pleural effusion. Tiny left pneumothorax present. This was discussed with the patient's interventional radiologist. Stable cardiomegaly. No acute bony abnormality. IMPRESSION: Left chest tube noted in good anatomic position. Significant resolution of left-sided pneumothorax. Residual left lower pleural effusion present. Miniscule left pneumothorax noted. Electronically Signed: ByMarcello Moores  Register On: 04/06/2016 16:30     Scheduled Meds: . Derrill Memo ON 05/01/2016] cyanocobalamin  1,000 mcg Intramuscular Q30 days  . dextromethorphan  30 mg Oral QHS  . doxazosin  4 mg Oral QHS  . eltrombopag  75 mg Oral QAC lunch  . ferrous sulfate  325 mg Oral QPC supper  . fluticasone  1 spray Each Nare QHS  . guaiFENesin  600 mg Oral BID  . insulin aspart  0-15 Units Subcutaneous TID WC  . insulin glargine  15 Units Subcutaneous Daily  . irbesartan  150 mg Oral Daily  . metoprolol succinate  100 mg Oral Daily  . multivitamin with minerals  1 tablet Oral QHS  . piperacillin-tazobactam (ZOSYN)  IV  3.375 g Intravenous Q8H  . sodium chloride flush  3 mL Intravenous Q12H  . vancomycin  1,250 mg Intravenous Q24H   Continuous Infusions:     Marzetta Board, MD, PhD [facility] Pager [phone]  If 7PM-7AM, please contact night-coverage [email or web address] Password [email or web address] 04/08/2016, 8:14 AM

## 2016-04-08 NOTE — Progress Notes (Signed)
Procedure(s) (LRB): REMOVAL OF PLEURAL DRAINAGE CATHETER (Left) POSSIBLE CHEST TUBE INSERTION (Left) Subjective: L empyema drained with IR catheter, residual loculated fluid at L base Pleurx cath non functional Will flush catheter but if blocked will remove in OR and place chest tube to drain loculated L basilar effusion  Objective: Vital signs in last 24 hours: Temp:  [97.6 F (36.4 C)-98.3 F (36.8 C)] 98.3 F (36.8 C) (10/19 0614) Pulse Rate:  [90-114] 114 (10/19 5208) Cardiac Rhythm: Sinus tachycardia (10/18 1900) Resp:  [22-24] 23 (10/19 0614) BP: (123-181)/(60-86) 153/68 (10/19 0648) SpO2:  [95 %-98 %] 97 % (10/19 0614)  Hemodynamic parameters for last 24 hours:  afebrile  Intake/Output from previous day: 10/18 0701 - 10/19 0700 In: 580 [P.O.:580] Out: 950 [Urine:800; Chest Tube:150] Intake/Output this shift: No intake/output data recorded.       Exam    General- alert and comfortable   Lungs- clear without rales, wheezes on R, L base with decreased BS, IR pigtail catheter in place   Cor- regular rate and rhythm, no murmur , gallop   Abdomen- soft, non-tender   Extremities - warm, non-tender, minimal edema   Neuro- oriented, appropriate, no focal weakness   Lab Results:  Recent Labs  04/07/16 0227 04/08/16 0231  WBC 11.0* 11.9*  HGB 8.8* 8.5*  HCT 30.4* 28.9*  PLT 76* 74*   BMET:  Recent Labs  04/07/16 0227 04/08/16 0231  NA 137 139  K 3.3* 3.1*  CL 104 107  CO2 24 26  GLUCOSE 109* 100*  BUN 7 8  CREATININE 1.34* 1.35*  CALCIUM 8.0* 7.7*    PT/INR: No results for input(s): LABPROT, INR in the last 72 hours. ABG No results found for: PHART, HCO3, TCO2, ACIDBASEDEF, O2SAT CBG (last 3)   Recent Labs  04/07/16 1647 04/07/16 2123 04/08/16 0613  GLUCAP 225* 159* 108*    Assessment/Plan: S/P Procedure(s) (LRB): REMOVAL OF PLEURAL DRAINAGE CATHETER (Left) POSSIBLE CHEST TUBE INSERTION (LeftOR later today   LOS: 5 days    Tharon Aquas  Trigt III 04/08/2016

## 2016-04-08 NOTE — Anesthesia Postprocedure Evaluation (Signed)
Anesthesia Post Note  Patient: Ricardo Watkins  Procedure(s) Performed: Procedure(s) (LRB): REMOVAL OF PLEURAL DRAINAGE CATHETER (Left) CHEST TUBE INSERTION (Left)  Patient location during evaluation: PACU Anesthesia Type: MAC Level of consciousness: awake, awake and alert and oriented Pain management: pain level controlled Vital Signs Assessment: post-procedure vital signs reviewed and stable Respiratory status: spontaneous breathing and respiratory function stable Cardiovascular status: blood pressure returned to baseline Anesthetic complications: no    Last Vitals:  Vitals:   04/08/16 1558 04/08/16 2019  BP: 126/63 (!) 142/65  Pulse: 95 (!) 101  Resp:  20  Temp: 37.1 C 36.7 C    Last Pain:  Vitals:   04/08/16 2019  TempSrc: Oral  PainSc:                  [name] COKER

## 2016-04-08 NOTE — Progress Notes (Signed)
Pt having some chest pain. EKG done and MD paged. 1 nitro given will reassess. Pt states it hurts when he breaths. No orders placed right now.

## 2016-04-08 NOTE — Brief Op Note (Addendum)
04/03/2016 - 04/08/2016  12:21 PM  PATIENT:  Ricardo Watkins  82 y.o. male  PRE-OPERATIVE DIAGNOSIS:  PLEURAL EFFUSION- empyema Left  POST-OPERATIVE DIAGNOSIS:  PLEURAL EFFUSION - em,pyema Left  PROCEDURE:  Procedure(s): REMOVAL OF PLEURAL DRAINAGE CATHETER (Left) CHEST TUBE INSERTION (Left)  pleurx catheter nonfunctional and removed New 4F chest tube place to drain loculated effusion at L base  SURGEON:  Surgeon(s) and Role:    * [name], MD - Primary  PHYSICIAN ASSISTANT:   ASSISTANTS: none   ANESTHESIA:   local and MAC  EBL:  Total I/O In: 500 [I.V.:500] Out: -   BLOOD ADMINISTERED:none  DRAINS: 4F Chest Tube(s) in the L subpulmonic space   LOCAL MEDICATIONS USED:  LIDOCAINE  and Amount: 14 ml  SPECIMEN:  Aspirate  DISPOSITION OF SPECIMEN:  micro lab  COUNTS:  YES  TOURNIQUET:  * No tourniquets in log *  DICTATION: .Dragon Dictation  PLAN OF CARE: return to bed 2 [place]  PATIENT DISPOSITION:  PACU - hemodynamically stable.   Delay start of Pharmacological VTE agent (>24hrs) due to surgical blood loss or risk of bleeding: yes  Plan on removal of new chest tube in 48 hrs if no output Patient to remain in hospital until L pleural IR pigtail is removed

## 2016-04-08 NOTE — Anesthesia Preprocedure Evaluation (Signed)
Anesthesia Evaluation  Patient identified by MRN, date of birth, ID band Patient awake    Reviewed: Allergy & Precautions, NPO status , Patient's Chart, lab work & pertinent test results  Airway Mallampati: II  TM Distance: >3 FB Neck ROM: Full    Dental  (+) Poor Dentition, Dental Advisory Given   Pulmonary former smoker,     + decreased breath sounds      Cardiovascular hypertension,  Rhythm:Regular Rate:Normal     Neuro/Psych    GI/Hepatic   Endo/Other  diabetes  Renal/GU      Musculoskeletal   Abdominal   Peds  Hematology   Anesthesia Other Findings   Reproductive/Obstetrics                             Anesthesia Physical Anesthesia Plan  ASA: III  Anesthesia Plan: MAC   Post-op Pain Management:    Induction: Intravenous  Airway Management Planned: Simple Face Mask and Natural Airway  Additional Equipment:   Intra-op Plan:   Post-operative Plan:   Informed Consent: I have reviewed the patients History and Physical, chart, labs and discussed the procedure including the risks, benefits and alternatives for the proposed anesthesia with the patient or authorized representative who has indicated his/her understanding and acceptance.   Dental advisory given  Plan Discussed with: CRNA and Anesthesiologist  Anesthesia Plan Comments:         Anesthesia Quick Evaluation

## 2016-04-08 NOTE — Transfer of Care (Signed)
Immediate Anesthesia Transfer of Care Note  Patient: Ricardo Watkins  Procedure(s) Performed: Procedure(s): REMOVAL OF PLEURAL DRAINAGE CATHETER (Left) CHEST TUBE INSERTION (Left)  Patient Location: PACU  Anesthesia Type:MAC  Level of Consciousness: sedated and responds to stimulation  Airway & Oxygen Therapy: Patient Spontanous Breathing and Patient connected to nasal cannula oxygen  Post-op Assessment: Report given to RN, Post -op Vital signs reviewed and stable and Patient moving all extremities  Post vital signs: Reviewed and stable  Last Vitals:  Vitals:   04/08/16 0614 04/08/16 0648  BP: (!) 181/86 (!) 153/68  Pulse: (!) 114   Resp: (!) 23   Temp: 36.8 C     Last Pain:  Vitals:   04/08/16 0614  TempSrc: Oral  PainSc:       Patients Stated Pain Goal: 0 (12/87/86 7672)  Complications: No apparent anesthesia complications

## 2016-04-09 ENCOUNTER — Inpatient Hospital Stay (HOSPITAL_COMMUNITY): Payer: Medicare Other

## 2016-04-09 ENCOUNTER — Encounter (HOSPITAL_COMMUNITY): Payer: Self-pay | Admitting: Cardiothoracic Surgery

## 2016-04-09 LAB — CBC
HCT: 28.6 % — ABNORMAL LOW (ref 39.0–52.0)
Hemoglobin: 8.2 g/dL — ABNORMAL LOW (ref 13.0–17.0)
MCH: 23.1 pg — ABNORMAL LOW (ref 26.0–34.0)
MCHC: 28.7 g/dL — ABNORMAL LOW (ref 30.0–36.0)
MCV: 80.6 fL (ref 78.0–100.0)
Platelets: 75 10*3/uL — ABNORMAL LOW (ref 150–400)
RBC: 3.55 MIL/uL — ABNORMAL LOW (ref 4.22–5.81)
RDW: 23.3 % — ABNORMAL HIGH (ref 11.5–15.5)
WBC: 25.2 10*3/uL — ABNORMAL HIGH (ref 4.0–10.5)

## 2016-04-09 LAB — GLUCOSE, CAPILLARY
GLUCOSE-CAPILLARY: 103 mg/dL — AB (ref 65–99)
GLUCOSE-CAPILLARY: 132 mg/dL — AB (ref 65–99)
GLUCOSE-CAPILLARY: 223 mg/dL — AB (ref 65–99)
Glucose-Capillary: 223 mg/dL — ABNORMAL HIGH (ref 65–99)

## 2016-04-09 LAB — COMPREHENSIVE METABOLIC PANEL
ALT: 14 U/L — ABNORMAL LOW (ref 17–63)
AST: 17 U/L (ref 15–41)
Albumin: 1.8 g/dL — ABNORMAL LOW (ref 3.5–5.0)
Alkaline Phosphatase: 62 U/L (ref 38–126)
Anion gap: 6 (ref 5–15)
BUN: 9 mg/dL (ref 6–20)
CO2: 27 mmol/L (ref 22–32)
Calcium: 7.9 mg/dL — ABNORMAL LOW (ref 8.9–10.3)
Chloride: 106 mmol/L (ref 101–111)
Creatinine, Ser: 1.62 mg/dL — ABNORMAL HIGH (ref 0.61–1.24)
GFR calc Af Amer: 44 mL/min — ABNORMAL LOW (ref >60)
GFR calc non Af Amer: 38 mL/min — ABNORMAL LOW (ref >60)
Glucose, Bld: 111 mg/dL — ABNORMAL HIGH (ref 65–99)
Potassium: 3.5 mmol/L (ref 3.5–5.1)
Sodium: 139 mmol/L (ref 135–145)
Total Bilirubin: 0.4 mg/dL (ref 0.3–1.2)
Total Protein: 6.7 g/dL (ref 6.5–8.1)

## 2016-04-09 LAB — VANCOMYCIN, TROUGH: VANCOMYCIN TR: 16 ug/mL (ref 15–20)

## 2016-04-09 MED ORDER — METOCLOPRAMIDE HCL 5 MG/ML IJ SOLN
10.0000 mg | Freq: Four times a day (QID) | INTRAMUSCULAR | Status: DC
Start: 2016-04-09 — End: 2016-04-10
  Administered 2016-04-09 – 2016-04-10 (×6): 10 mg via INTRAVENOUS
  Filled 2016-04-09 (×6): qty 2

## 2016-04-09 MED ORDER — MORPHINE SULFATE (PF) 2 MG/ML IV SOLN
1.0000 mg | INTRAVENOUS | Status: DC | PRN
  Administered 2016-04-10: 1 mg via INTRAVENOUS
  Filled 2016-04-09: qty 1

## 2016-04-09 NOTE — Progress Notes (Addendum)
      Ricardo Watkins       [place] [address]             [phone]      1 Day Post-Op Procedure(s) (LRB): REMOVAL OF PLEURAL DRAINAGE CATHETER (Left) CHEST TUBE INSERTION (Left)   Subjective:  Complains of nausea.  Minor pain at chest tube sites  Objective: Vital signs in last 24 hours: Temp:  [97.8 F (36.6 C)-98.7 F (37.1 C)] 98 F (36.7 C) (10/20 0459) Pulse Rate:  [52-131] 102 (10/20 0459) Cardiac Rhythm: Sinus tachycardia (10/19 1900) Resp:  [14-46] 18 (10/20 0459) BP: (105-156)/(53-108) 126/53 (10/20 0459) SpO2:  [90 %-100 %] 97 % (10/20 0459) FiO2 (%):  [40 %] 40 % (10/19 1401)  Intake/Output from previous day: 10/19 0701 - 10/20 0700 In: 1320 [P.O.:720; I.V.:500; IV Piggyback:100] Out: 750 [Urine:675; Blood:5; Chest Tube:70]  General appearance: alert, cooperative and no distress Heart: regular rate and rhythm and systolic murmur: early systolic 3/6,  Lungs: diminished breath sounds left base Abdomen: soft, non-tender; bowel sounds normal; no masses,  no organomegaly Wound: bloody drainage around 28 Fr chest tube  Lab Results:  Recent Labs  04/08/16 0231 04/09/16 0419  WBC 11.9* PENDING  HGB 8.5* 8.2*  HCT 28.9* 28.6*  PLT 74* PENDING   BMET:  Recent Labs  04/08/16 0231 04/09/16 0419  NA 139 139  K 3.1* 3.5  CL 107 106  CO2 26 27  GLUCOSE 100* 111*  BUN 8 9  CREATININE 1.35* 1.62*  CALCIUM 7.7* 7.9*    PT/INR: No results for input(s): LABPROT, INR in the last 72 hours. ABG No results found for: PHART, HCO3, TCO2, ACIDBASEDEF, O2SAT CBG (last 3)   Recent Labs  04/08/16 1610 04/08/16 2053 04/09/16 0554  GLUCAP 98 170* 103*    Assessment/Plan: S/P Procedure(s) (LRB): REMOVAL OF PLEURAL DRAINAGE CATHETER (Left) CHEST TUBE INSERTION (Left)  1. Chest tubes- 70 cc output since surgery yesterday, no air leak present...leave chest tube on suction today 2. GI- nausea, anti-emetics ordered 3. CV- Sinus tach, H/O HTN-  on lopressor, avapro 4. Dispo- leave chest tubes on suction today, repeat CXR in AM, care per primary   LOS: 6 days    BARRETT, ERIN 04/09/2016  Remove left subpulmonic chest tube on Sunday and less drainage become significant Remove IR pigtail  drain in place until dry Cultures of pleural fluid remain negative but patient needs broad-spectrum antibiotic coverage  patient examined and medical record reviewed,agree with above note. Tharon Aquas Trigt III 04/09/2016

## 2016-04-09 NOTE — Progress Notes (Signed)
Pharmacy Antibiotic Note  Ricardo Watkins is a 82 y.o. male admitted on 04/03/2016 with sepsis.  Continues on Vancomycin and Zosyn Day # 7,Scr slightly trending up 1.35 > 1.62, afebrile, but wbc also trending up 11 >> 25.2 Cultures negative to date. Vancomycin trough = 16, drawn 1 hr late, but true trough should still be within therapeutic range.  Plan: Continue Vancomycin 1250mg  IV q24h  Zosyn 3.375g IV q8h extended infusion Monitor renal function closely  Height: 5\' 7"  (170.2 cm) Weight: 188 lb 9.6 oz (85.5 kg) IBW/kg (Calculated) : 66.1  Temp (24hrs), Avg:98.2 F (36.8 C), Min:98 F (36.7 C), Max:98.6 F (37 C)   Recent Labs Lab 04/03/16 1525 04/03/16 1924  04/05/16 0209 04/06/16 0215 04/07/16 0227 04/08/16 0231 04/08/16 1819 04/09/16 0419 04/09/16 1828  WBC  --   --   < > 10.6* 10.3 11.0* 11.9*  --  25.2*  --   CREATININE  --   --   < > 1.46* 1.43* 1.34* 1.35*  --  1.62*  --   LATICACIDVEN 1.88 1.15  --   --   --   --   --   --   --   --   VANCOTROUGH  --   --   --   --   --   --   --  37*  --  16  < > = values in this interval not displayed.  Estimated Creatinine Clearance: 36.1 mL/min (by C-G formula based on SCr of 1.62 mg/dL (H)).    Allergies  Allergen Reactions  . Glucophage [Metformin Hydrochloride] Other (See Comments)    Chest pain  . Zetia [Ezetimibe] Other (See Comments)    weakness  . Fenofibrate Rash  . Niacin-Lovastatin Er Rash    Antimicrobials this admission:  Vanc 10/14 >>  Zosyn 10/14 >>   Microbiology results:  10/14 BCx: Neg 10/14 UCx: Neg 10/19 pleural fluid:  Thank you for allowing pharmacy to be a part of this patient's care.  Maryanna Shape, PharmD, BCPS  Clinical Pharmacist  Pager: [phone]   04/09/2016, 7:23 PM

## 2016-04-09 NOTE — Progress Notes (Signed)
PROGRESS NOTE  Ricardo Watkins STM:[id number] DOB: 1930-08-17 DOA: 04/03/2016 PCP: [name], MD   LOS: 6 days   Brief Narrative: 82 y.o.malewith medical history significant of non-Hodgkin's lymphoma, CAD, type 2 diabetes, hyperlipidemia, hypertension, anemia, prostate CA, diverticulosis, cataracts, DVT, chronic cough, chronic left pleural effusion with insertion of left pleural catheter on 11/10/2015 by [name] who comes to the hospital due to fever, chills, worsening fatigue and malaise. Per patient, his daughter and wife, his chest tube drainage has been decreasing his output for the pastseveral days and he stopped completely the day PTA. He normally has up to 500 mL of drainage every other day. Patient was worked up and found to be septic and have a loculated effusion.   Assessment & Plan: Principal Problem:   Empyema ([place]) Active Problems:   CAD (coronary artery disease)   History of lymphoma   Essential hypertension   Thrombocytopenia (HCC)   Microcytic anemia   Type 2 diabetes mellitus (HCC)   Empyema lung (HCC)   Sepsis due to loculated Effusion/Empyema from Left Obstructed Pleural Catheter for recurrent and chronic Non Malignant Pleural Effusions with hypoxic respiratory failure - Sepsis physiology resolved; Old left PleurX catheter removed by TCV 10/19 - continue IV Vancomycin and Zosyn for now. Cultures negative - CT Scan on admission showed Moderate-sized loculated left pleural effusion collection with a PleurX drainage catheter in place. - Cardiothoracic Surgery evaluated and will continue to follow; IR placed CT Guided Fluid Drain by PigTail Catheter 10/16 and drained ~550 mL. Patient taken to OR 10/19 with removal of old catheter and placement of a left sided chest tube  - Continue Supplemental Oxygen and wean  - doing well post op, has back pain at chest tube site  - WBC increased to 25 today  Hx of Non-Hodgkins Lymphoma - PET-CT Scan showed Enlarging  hypermetabolic mediastinal lymph nodes suggesting recurrent lymphoma. - Patient was seen by Hematologist/Oncologist [name], will have close outpatient follow up  - Continue scheduled follow-ups with Hematology/Oncology after empyema treatment.  MDS (myelodysplastic syndrome) with 5q deletion - Followed by Hematology/Oncology; Seen by [name] today - Continue Promacta. Patient's Hematologist increased it to 75 mg and will reassess next visit.  Hypokalemia - Improved, replete prn  Insulin Dependent Type 2 Diabetes Mellitus  -Continue Lantus and SSI  CAD  - Continue Metoprolol 100 mg by mouth daily and Irbesartan 150 mg po daily - Clopidogrel was helped by Hematology/Oncology because of Thrombocytopenia.  Essential Hypertension - Continue Metoprolol 100 mg po daily and Irbesartan 150 mg po daily. - Monitor blood pressure, BUN/creatinine and electrolytes. Stable today   Thrombocytopenia  - Platelet Count stable now - SCDs for DVT prophylaxis. - Continue Promacta.  Patient's Hematologist increased it to 75 mg and will reassess next visit. - Hematology recommended stopping Clopidogrel; P2Y12 from 10/15 WNL  Microcytic Anemia - C/w Ferrous Sulfate 325 mg tablet po  - monitor, Hb trending down, no s/s of bleeding. Transfuse for Hb < 7   DVT prophylaxis: SCD Code Status: Full Family Communication: no family bedside Disposition Plan: home when ready  Consultants:   Cardiothoracic surgery  IR  HemOnc  Procedures:   CT Guided IR Pigtail Cathether Drain  Antimicrobials:  Vancomycin 10/14 >>  Zosyn 10/14 >>  Subjective: - complains of back pain, worse with deep breathing   Objective: Vitals:   04/08/16 1558 04/08/16 2019 04/09/16 0459 04/09/16 1324  BP: 126/63 (!) 142/65 (!) 126/53 (!) 132/57  Pulse: 95 Marland Kitchen)  101 (!) 102 (!) 108  Resp:  20 18 17   Temp: 98.7 F (37.1 C) 98 F (36.7 C) 98 F (36.7 C) 98.6 F (37 C)  TempSrc: Oral Oral Oral Oral  SpO2: 97%  98% 97% 96%  Weight:      Height:        Intake/Output Summary (Last 24 hours) at 04/09/16 1417 Last data filed at 04/09/16 1300  Gross per 24 hour  Intake             1180 ml  Output              795 ml  Net              385 ml   Filed Weights   04/03/16 1946 04/03/16 2026 04/04/16 1000  Weight: 85.3 kg (188 lb) 85.3 kg (188 lb) 85.5 kg (188 lb 9.6 oz)    Examination: Constitutional: NAD Vitals:   04/08/16 1558 04/08/16 2019 04/09/16 0459 04/09/16 1324  BP: 126/63 (!) 142/65 (!) 126/53 (!) 132/57  Pulse: 95 (!) 101 (!) 102 (!) 108  Resp:  20 18 17   Temp: 98.7 F (37.1 C) 98 F (36.7 C) 98 F (36.7 C) 98.6 F (37 C)  TempSrc: Oral Oral Oral Oral  SpO2: 97% 98% 97% 96%  Weight:      Height:       Eyes: PERRL, lids and conjunctivae normal ENMT: Mucous membranes are moist. Respiratory: decreased breath sounds at the bases, no wheezing, no crackles. Pigtail catheter in place Cardiovascular: Regular rate and rhythm, 3/6 SEM. No LE edema Abdomen: no tenderness. Bowel sounds positive.  Musculoskeletal: no clubbing / cyanosis.  Neurologic: non focal Psychiatric: Normal judgment and insight. Alert and oriented x 3. Normal mood.    Data Reviewed: I have personally reviewed following labs and imaging studies  CBC:  Recent Labs Lab 04/04/16 0405 04/05/16 0209 04/06/16 0215 04/07/16 0227 04/08/16 0231 04/09/16 0419  WBC 13.5* 10.6* 10.3 11.0* 11.9* 25.2*  NEUTROABS 8.1* 7.0 6.9 7.3 8.6*  --   HGB 9.0* 8.7* 9.5* 8.8* 8.5* 8.2*  HCT 30.3* 29.5* 32.1* 30.4* 28.9* 28.6*  MCV 79.1 79.1 80.0 80.0 79.4 80.6  PLT 74* 68* 84* 76* 74* 75*   Basic Metabolic Panel:  Recent Labs Lab 04/05/16 0209 04/06/16 0215 04/07/16 0227 04/08/16 0231 04/09/16 0419  NA 136 137 137 139 139  K 3.4* 3.7 3.3* 3.1* 3.5  CL 105 107 104 107 106  CO2 23 20* 24 26 27   GLUCOSE 115* 103* 109* 100* 111*  BUN 11 10 7 8 9   CREATININE 1.46* 1.43* 1.34* 1.35* 1.62*  CALCIUM 7.7* 7.9* 8.0* 7.7*  7.9*  MG  --   --  1.9  --   --   PHOS  --   --  2.6  --   --    GFR: Estimated Creatinine Clearance: 36.1 mL/min (by C-G formula based on SCr of 1.62 mg/dL (H)). Liver Function Tests:  Recent Labs Lab 04/03/16 1505 04/04/16 0124 04/04/16 0405 04/05/16 0209 04/09/16 0419  AST 20 14* 14* 14* 17  ALT 17 14* 15* 14* 14*  ALKPHOS 75 58 58 56 62  BILITOT 0.3 0.3 0.5 0.5 0.4  PROT 7.9 6.8 6.6 6.9 6.7  ALBUMIN 2.6* 2.1* 2.1* 2.0* 1.8*   No results for input(s): LIPASE, AMYLASE in the last 168 hours. No results for input(s): AMMONIA in the last 168 hours. Coagulation Profile:  Recent Labs Lab 04/03/16 1910  INR 1.19   Cardiac Enzymes: No results for input(s): CKTOTAL, CKMB, CKMBINDEX, TROPONINI in the last 168 hours. BNP (last 3 results)  Recent Labs  01/27/16 1119  PROBNP 311.0*   HbA1C: No results for input(s): HGBA1C in the last 72 hours. CBG:  Recent Labs Lab 04/08/16 1228 04/08/16 1610 04/08/16 2053 04/09/16 0554 04/09/16 1107  GLUCAP 98 98 170* 103* 223*   Lipid Profile: No results for input(s): CHOL, HDL, LDLCALC, TRIG, CHOLHDL, LDLDIRECT in the last 72 hours. Thyroid Function Tests: No results for input(s): TSH, T4TOTAL, FREET4, T3FREE, THYROIDAB in the last 72 hours. Anemia Panel: No results for input(s): VITAMINB12, FOLATE, FERRITIN, TIBC, IRON, RETICCTPCT in the last 72 hours. Urine analysis:    Component Value Date/Time   COLORURINE AMBER (A) 04/03/2016 2007   APPEARANCEUR CLOUDY (A) 04/03/2016 2007   LABSPEC 1.022 04/03/2016 2007   PHURINE 5.0 04/03/2016 2007   GLUCOSEU NEGATIVE 04/03/2016 2007   HGBUR SMALL (A) 04/03/2016 2007   BILIRUBINUR NEGATIVE 04/03/2016 2007   [place] NEGATIVE 04/03/2016 2007   PROTEINUR 100 (A) 04/03/2016 2007   NITRITE NEGATIVE 04/03/2016 2007   LEUKOCYTESUR NEGATIVE 04/03/2016 2007   Sepsis Labs: Invalid input(s): PROCALCITONIN, LACTICIDVEN  Recent Results (from the past 240 hour(s))  Blood Culture  (routine x 2)     Status: None   Collection Time: 04/03/16  7:01 PM  Result Value Ref Range Status   Specimen Description LEFT ANTECUBITAL  Final   Special Requests BOTTLES DRAWN AEROBIC AND ANAEROBIC 5CC  Final   Culture NO GROWTH 5 DAYS  Final   Report Status 04/08/2016 FINAL  Final  Blood Culture (routine x 2)     Status: None   Collection Time: 04/03/16  7:06 PM  Result Value Ref Range Status   Specimen Description RIGHT ANTECUBITAL  Final   Special Requests BOTTLES DRAWN AEROBIC AND ANAEROBIC 5CC  Final   Culture NO GROWTH 5 DAYS  Final   Report Status 04/08/2016 FINAL  Final  Urine culture     Status: Abnormal   Collection Time: 04/03/16  8:07 PM  Result Value Ref Range Status   Specimen Description URINE, CLEAN CATCH  Final   Special Requests NONE  Final   Culture MULTIPLE SPECIES PRESENT, SUGGEST RECOLLECTION (A)  Final   Report Status 04/05/2016 FINAL  Final  Surgical pcr screen     Status: None   Collection Time: 04/08/16  5:03 AM  Result Value Ref Range Status   MRSA, PCR NEGATIVE NEGATIVE Final   Staphylococcus aureus NEGATIVE NEGATIVE Final    Comment:        The Xpert SA Assay (FDA approved for NASAL specimens in patients over 54 years of age), is one component of a comprehensive surveillance program.  Test performance has been validated by [facility] for patients greater than or equal to 61 year old. It is not intended to diagnose infection nor to guide or monitor treatment.   Anaerobic culture     Status: None (Preliminary result)   Collection Time: 04/08/16 11:56 AM  Result Value Ref Range Status   Specimen Description FLUID LEFT PLEURAL  Final   Special Requests PATIENT ON FOLLOWING  ZOSYN AND VANCOMYCIN  Final   Gram Stain PENDING  Incomplete   Culture PENDING  Incomplete   Report Status PENDING  Incomplete  Body fluid culture     Status: None (Preliminary result)   Collection Time: 04/08/16 11:56 AM  Result Value Ref Range Status   Specimen  Description FLUID LEFT PLEURAL  Final   Special Requests PATIENT ON FOLLOWING  ZOSYN AND VANCOMYCIN  Final   Gram Stain   Final    RARE WBC PRESENT, PREDOMINANTLY PMN NO ORGANISMS SEEN    Culture NO GROWTH 1 DAY  Final   Report Status PENDING  Incomplete      Radiology Studies: Dg Chest Port 1 View  Result Date: 04/09/2016 CLINICAL DATA:  Soreness in chest.  Chest tube present. EXAM: PORTABLE CHEST 1 VIEW COMPARISON:  04/08/2016.  04/03/2016. FINDINGS: Again noted is a left basilar chest tube and a small bore pigtail chest tube in the lateral upper chest. There continues to be evidence for a loculated left pleural fluid which is caudal to the pigtail catheter. Evidence for compressive atelectasis at the left lung base. Slightly increased densities in the right lower lung. Heart size is stable. Trachea is midline. Negative for pneumothorax. IMPRESSION: No change in the loculated left pleural effusion. Stable position of the left chest tubes without a pneumothorax. Electronically Signed   By: Markus Daft M.D.   On: 04/09/2016 08:56   Dg Chest Port 1 View  Result Date: 04/08/2016 CLINICAL DATA:  Shortness of breath. EXAM: PORTABLE CHEST 1 VIEW COMPARISON:  04/08/2016.  04/07/2016. FINDINGS: PleurX catheter again noted on the left in stable position. No pneumothorax. Cardiomegaly with bilateral pulmonary interstitial prominence and left-sided pleural effusion noted consistent congestive heart failure. Similar finding noted on prior exam from earlier today. IMPRESSION: 1. Left chest tube noted in stable position. No pneumothorax. Stable left pleural effusion. 2. Persistent changes of congestive heart failure with pulmonary interstitial edema. No interim change from earlier exam today. Electronically Signed   By: Marcello Moores  Register   On: 04/08/2016 13:36   Dg Chest Portable 1 View  Result Date: 04/08/2016 CLINICAL DATA:  PleurX catheter placement. EXAM: PORTABLE CHEST 1 VIEW COMPARISON:  04/07/2016.  FINDINGS: PleurX catheter on the left again noted. Small left pleural effusion again noted. No pneumothorax . Cardiomegaly with pulmonary vascular prominence and mild interstitial prominence noted. Mild component congestive heart failure cannot be excluded. IMPRESSION: 1. PleurX catheter on the left again noted. No pneumothorax. Small left pleural effusion again noted. 2. Cannot exclude mild congestive heart failure with mild interstitial edema. Electronically Signed   By: Marcello Moores  Register   On: 04/08/2016 12:31     Scheduled Meds: . Derrill Memo ON 05/01/2016] cyanocobalamin  1,000 mcg Intramuscular Q30 days  . dextromethorphan  30 mg Oral QHS  . doxazosin  4 mg Oral QHS  . eltrombopag  75 mg Oral QAC lunch  . ferrous sulfate  325 mg Oral QPC supper  . fluticasone  1 spray Each Nare QHS  . guaiFENesin  600 mg Oral BID  . insulin aspart  0-15 Units Subcutaneous TID WC  . insulin glargine  15 Units Subcutaneous Daily  . irbesartan  150 mg Oral Daily  . metoCLOPramide (REGLAN) injection  10 mg Intravenous Q6H  . metoprolol succinate  100 mg Oral Daily  . multivitamin with minerals  1 tablet Oral QHS  . piperacillin-tazobactam (ZOSYN)  IV  3.375 g Intravenous Q8H  . sodium chloride flush  3 mL Intravenous Q12H  . vancomycin  1,250 mg Intravenous Q24H   Continuous Infusions:     Marzetta Board, MD, PhD [facility] Pager [phone]  If 7PM-7AM, please contact night-coverage [email or web address] Password [email or web address] 04/09/2016, 2:17 PM

## 2016-04-09 NOTE — Care Management Important Message (Signed)
Important Message  Patient Details  Name: Ricardo Watkins MRN: [id number] Date of Birth: 1930-10-02   Medicare Important Message Given:  Yes    Affie Gasner Abena 04/09/2016, 11:10 AM

## 2016-04-09 NOTE — Progress Notes (Signed)
Pharm. Call and aware of crit. Lab value on vanco level. No orders at this time.

## 2016-04-09 NOTE — Op Note (Signed)
NAMENANCY, MANUELE NO.:  [id number]  MEDICAL RECORD NO.:  [id number]  LOCATION:  2W03C                        FACILITY:  [facility]  PHYSICIAN:  [name], M.D.  DATE OF BIRTH:  1930/08/01  DATE OF PROCEDURE:  04/08/2016 DATE OF DISCHARGE:                              OPERATIVE REPORT   OPERATIONS: 1. Removal of left PleurX catheter. 2. Placement of left 28-French chest tube.  SURGEON:  [name], M.D.  ANESTHESIA:  Local with 1% lidocaine with IV conscious monitored sedation by Anesthesia.  PREOPERATIVE DIAGNOSES:  Left-sided loculated pleural effusion-empyema, history of pancytopenia, and history of lymphoma.  POSTOPERATIVE DIAGNOSES:  Left-sided loculated pleural effusion-empyema, history of pancytopenia, and history of lymphoma.  OPERATIVE PROCEDURE:  The patient was brought from the preoperative area.  After informed consent had been obtained and the proper site marked.  The patient was placed supine on the operating table with the left side bumped up under a soft blanket.  The left chest was prepped and draped around the previously placed PleurX catheter.  A proper time- out was performed.  First, the PleurX catheter was accessed and flushed with sterile saline. We then tried to drain the catheter to see if that was still functional and scant fluid returned to the suction bottle.  I felt that the catheter should be removed because it was sitting in a probable infected pleural space.  Local 1% lidocaine was infiltrated around the exit site of catheter. Using surgical instruments, the cuff which had incorporated into the dermis was freed up and the PleurX catheter was removed in its entirety. There was minimal bleeding.  The incision appeared to be without evidence of infection.  It was closed with 2 interrupted 3-0 nylon sutures.  Next, a 28-French chest tube was placed at the base of the left pleural space to drain the remaining  loculated effusion.  Lidocaine was infiltrated.  A small incision was made.  Further dissection and infiltration of lidocaine to the intercostal muscle were performed.  The pleural space was gently entered.  There were loculations in the pleural space.  I freed these up gently with a finger.  I gently inserted a PleurX catheter posteriorly into the subpulmonic space on the left side above the diaphragm.  This drain did some serosanguineous fluid, which was sent for culture. I then closed the small incision with subcutaneous Vicryl and interrupted nylon for the skin and secured the chest tube with a heavy silk suture.  The chest tube was connected to a Pleur-evac drainage chamber.  Sterile dressings were applied.  The patient was then returned to the recovery room in stable condition.     [name], M.D.     PV/MEDQ  D:  04/08/2016  T:  04/09/2016  Job:  [id number]

## 2016-04-09 NOTE — Progress Notes (Signed)
Called into pt room by family member. Pt complaining of back pain for past 15 minutes. Educated patient on need to use call bell. Re-educated on call ball. Educated on pain management both medications and positioning. Pt repositioned, given PRN pain medication. Educated pt family on importance of requesting assistance with mobility.   Call light within reach, will continue to monitor.   Fritz Pickerel, RN

## 2016-04-10 ENCOUNTER — Inpatient Hospital Stay (HOSPITAL_COMMUNITY): Payer: Medicare Other

## 2016-04-10 LAB — COMPREHENSIVE METABOLIC PANEL
ALK PHOS: 59 U/L (ref 38–126)
ALT: 13 U/L — AB (ref 17–63)
AST: 14 U/L — ABNORMAL LOW (ref 15–41)
Albumin: 1.6 g/dL — ABNORMAL LOW (ref 3.5–5.0)
Anion gap: 6 (ref 5–15)
BILIRUBIN TOTAL: 0.4 mg/dL (ref 0.3–1.2)
BUN: 14 mg/dL (ref 6–20)
CALCIUM: 7.7 mg/dL — AB (ref 8.9–10.3)
CO2: 27 mmol/L (ref 22–32)
CREATININE: 1.9 mg/dL — AB (ref 0.61–1.24)
Chloride: 105 mmol/L (ref 101–111)
GFR calc non Af Amer: 31 mL/min — ABNORMAL LOW (ref >60)
GFR, EST AFRICAN AMERICAN: 36 mL/min — AB (ref >60)
Glucose, Bld: 124 mg/dL — ABNORMAL HIGH (ref 65–99)
Potassium: 3.5 mmol/L (ref 3.5–5.1)
SODIUM: 138 mmol/L (ref 135–145)
TOTAL PROTEIN: 6.4 g/dL — AB (ref 6.5–8.1)

## 2016-04-10 LAB — GLUCOSE, CAPILLARY
GLUCOSE-CAPILLARY: 116 mg/dL — AB (ref 65–99)
GLUCOSE-CAPILLARY: 175 mg/dL — AB (ref 65–99)
GLUCOSE-CAPILLARY: 133 mg/dL — AB (ref 65–99)
Glucose-Capillary: 156 mg/dL — ABNORMAL HIGH (ref 65–99)

## 2016-04-10 LAB — CBC WITH DIFFERENTIAL/PLATELET
BASOS ABS: 0 10*3/uL (ref 0.0–0.1)
BASOS PCT: 0 %
EOS ABS: 0 10*3/uL (ref 0.0–0.7)
Eosinophils Relative: 0 %
HCT: 26.1 % — ABNORMAL LOW (ref 39.0–52.0)
Hemoglobin: 7.6 g/dL — ABNORMAL LOW (ref 13.0–17.0)
Lymphocytes Relative: 5 %
Lymphs Abs: 1.2 10*3/uL (ref 0.7–4.0)
MCH: 23.2 pg — AB (ref 26.0–34.0)
MCHC: 29.1 g/dL — ABNORMAL LOW (ref 30.0–36.0)
MCV: 79.6 fL (ref 78.0–100.0)
MONO ABS: 4.3 10*3/uL — AB (ref 0.1–1.0)
Monocytes Relative: 18 %
NEUTROS ABS: 18.4 10*3/uL — AB (ref 1.7–7.7)
NEUTROS PCT: 77 %
PLATELETS: 85 10*3/uL — AB (ref 150–400)
RBC: 3.28 MIL/uL — ABNORMAL LOW (ref 4.22–5.81)
RDW: 23.5 % — AB (ref 11.5–15.5)
WBC: 23.9 10*3/uL — ABNORMAL HIGH (ref 4.0–10.5)

## 2016-04-10 MED ORDER — METOCLOPRAMIDE HCL 5 MG/ML IJ SOLN
5.0000 mg | Freq: Four times a day (QID) | INTRAMUSCULAR | Status: AC
  Administered 2016-04-11 (×2): 5 mg via INTRAVENOUS
  Filled 2016-04-10 (×2): qty 2

## 2016-04-10 NOTE — Progress Notes (Signed)
PROGRESS NOTE  Ricardo Watkins SEG:[id number] DOB: 12/13/1930 DOA: 04/03/2016 PCP: [name], MD   LOS: 7 days   Brief Narrative: 82 y.o.malewith medical history significant of non-Hodgkin's lymphoma, CAD, type 2 diabetes, hyperlipidemia, hypertension, anemia, prostate CA, diverticulosis, cataracts, DVT, chronic cough, chronic left pleural effusion with insertion of left pleural catheter on 11/10/2015 by [name] who comes to the hospital due to fever, chills, worsening fatigue and malaise. Per patient, his daughter and wife, his chest tube drainage has been decreasing his output for the pastseveral days and he stopped completely the day PTA. He normally has up to 500 mL of drainage every other day. Patient was worked up and found to be septic and have a loculated effusion.   Assessment & Plan: Principal Problem:   Empyema ([place]) Active Problems:   CAD (coronary artery disease)   History of lymphoma   Essential hypertension   Thrombocytopenia (HCC)   Microcytic anemia   Type 2 diabetes mellitus (HCC)   Empyema lung (HCC)   Sepsis due to loculated Effusion/Empyema from Left Obstructed Pleural Catheter for recurrent and chronic Non Malignant Pleural Effusions with hypoxic respiratory failure - Sepsis physiology resolved; Old left PleurX catheter removed by TCV 10/19 - continue IV Vancomycin and Zosyn for now. Cultures negative - CT Scan on admission showed Moderate-sized loculated left pleural effusion collection with a PleurX drainage catheter in place. - Cardiothoracic Surgery evaluated and will continue to follow; IR placed CT Guided Fluid Drain by PigTail Catheter 10/16 and drained ~550 mL. Patient taken to OR 10/19 with removal of old catheter and placement of a left sided chest tube  - Continue Supplemental Oxygen and wean  - doing well post op, has back pain at chest tube site - post op had significant leukocytosis, up to 25 K, 23.9 today  Hx of Non-Hodgkins  Lymphoma - PET-CT Scan showed Enlarging hypermetabolic mediastinal lymph nodes suggesting recurrent lymphoma. - Patient was seen by Hematologist/Oncologist [name], will have close outpatient follow up  - Continue scheduled follow-ups with Hematology/Oncology after empyema treatment.  CKD III - baseline Cr 1.3 - 1.7, closely monitor, his Cr has been slowly increasing since his surgery 2 days ago. He has good UOP per RN, is incontinent at times and cannot quantify.   Abdominal pain - patient with abdominal pain today, states that he occasionally gets these at home. Will monitor closely give elevated WBC (althought leukocytosis was temporary related to his surgery( - obtain plain XR - last BM yesterday  MDS (myelodysplastic syndrome) with 5q deletion - Followed by Hematology/Oncology; Seen by [name] today - Continue Promacta. Patient's Hematologist increased it to 75 mg and will reassess next visit.  Hypokalemia - Improved, replete prn  Insulin Dependent Type 2 Diabetes Mellitus  -Continue Lantus and SSI  CAD  - Continue Metoprolol 100 mg by mouth daily and Irbesartan 150 mg po daily - Clopidogrel was helped by Hematology/Oncology because of Thrombocytopenia.  Essential Hypertension - Continue Metoprolol 100 mg po daily and Irbesartan 150 mg po daily. - Monitor blood pressure, BUN/creatinine and electrolytes. Stable today   Thrombocytopenia  - Platelet Count stable now - SCDs for DVT prophylaxis. - Continue Promacta.  Patient's Hematologist increased it to 75 mg and will reassess next visit. - Hematology recommended stopping Clopidogrel; P2Y12 from 10/15 WNL  Microcytic Anemia - C/w Ferrous Sulfate 325 mg tablet po  - monitor, Hb trending down, no s/s of bleeding. Transfuse for Hb < 7   DVT prophylaxis:  SCD Code Status: Full Family Communication: no family bedside Disposition Plan: home when ready  Consultants:   Cardiothoracic  surgery  IR  HemOnc  Procedures:   CT Guided IR Pigtail Cathether Drain  Antimicrobials:  Vancomycin 10/14 >>  Zosyn 10/14 >>  Subjective: - complains of abdominal pain, no nausea/no vomiting. Last BM yesterday. Passing gas  Objective: Vitals:   04/09/16 0459 04/09/16 1324 04/09/16 2055 04/10/16 0430  BP: (!) 126/53 (!) 132/57 (!) 149/68 (!) 120/51  Pulse: (!) 102 (!) 108 (!) 107 (!) 103  Resp: 18 17 18    Temp: 98 F (36.7 C) 98.6 F (37 C) 98.6 F (37 C) 98.3 F (36.8 C)  TempSrc: Oral Oral Oral Oral  SpO2: 97% 96% 96% 96%  Weight:      Height:        Intake/Output Summary (Last 24 hours) at 04/10/16 1120 Last data filed at 04/10/16 1012  Gross per 24 hour  Intake             1110 ml  Output              654 ml  Net              456 ml   Filed Weights   04/03/16 1946 04/03/16 2026 04/04/16 1000  Weight: 85.3 kg (188 lb) 85.3 kg (188 lb) 85.5 kg (188 lb 9.6 oz)    Examination: Constitutional: NAD Vitals:   04/09/16 0459 04/09/16 1324 04/09/16 2055 04/10/16 0430  BP: (!) 126/53 (!) 132/57 (!) 149/68 (!) 120/51  Pulse: (!) 102 (!) 108 (!) 107 (!) 103  Resp: 18 17 18    Temp: 98 F (36.7 C) 98.6 F (37 C) 98.6 F (37 C) 98.3 F (36.8 C)  TempSrc: Oral Oral Oral Oral  SpO2: 97% 96% 96% 96%  Weight:      Height:       Eyes: PERRL, lids and conjunctivae normal ENMT: Mucous membranes are moist. Respiratory: decreased breath sounds at the bases, no wheezing, no crackles. Pigtail catheter / chest tube in place Cardiovascular: Regular rate and rhythm, 3/6 SEM. No LE edema Abdomen: no tenderness. Bowel sounds positive.  Musculoskeletal: no clubbing / cyanosis.  Neurologic: non focal   Data Reviewed: I have personally reviewed following labs and imaging studies  CBC:  Recent Labs Lab 04/05/16 0209 04/06/16 0215 04/07/16 0227 04/08/16 0231 04/09/16 0419 04/10/16 0430  WBC 10.6* 10.3 11.0* 11.9* 25.2* 23.9*  NEUTROABS 7.0 6.9 7.3 8.6*  --  18.4*   HGB 8.7* 9.5* 8.8* 8.5* 8.2* 7.6*  HCT 29.5* 32.1* 30.4* 28.9* 28.6* 26.1*  MCV 79.1 80.0 80.0 79.4 80.6 79.6  PLT 68* 84* 76* 74* 75* 85*   Basic Metabolic Panel:  Recent Labs Lab 04/06/16 0215 04/07/16 0227 04/08/16 0231 04/09/16 0419 04/10/16 0430  NA 137 137 139 139 138  K 3.7 3.3* 3.1* 3.5 3.5  CL 107 104 107 106 105  CO2 20* 24 26 27 27   GLUCOSE 103* 109* 100* 111* 124*  BUN 10 7 8 9 14   CREATININE 1.43* 1.34* 1.35* 1.62* 1.90*  CALCIUM 7.9* 8.0* 7.7* 7.9* 7.7*  MG  --  1.9  --   --   --   PHOS  --  2.6  --   --   --    GFR: Estimated Creatinine Clearance: 30.8 mL/min (by C-G formula based on SCr of 1.9 mg/dL (H)). Liver Function Tests:  Recent Labs Lab 04/04/16 0124 04/04/16 0405 04/05/16  1062 04/09/16 0419 04/10/16 0430  AST 14* 14* 14* 17 14*  ALT 14* 15* 14* 14* 13*  ALKPHOS 58 58 56 62 59  BILITOT 0.3 0.5 0.5 0.4 0.4  PROT 6.8 6.6 6.9 6.7 6.4*  ALBUMIN 2.1* 2.1* 2.0* 1.8* 1.6*   No results for input(s): LIPASE, AMYLASE in the last 168 hours. No results for input(s): AMMONIA in the last 168 hours. Coagulation Profile:  Recent Labs Lab 04/03/16 1910  INR 1.19   Cardiac Enzymes: No results for input(s): CKTOTAL, CKMB, CKMBINDEX, TROPONINI in the last 168 hours. BNP (last 3 results)  Recent Labs  01/27/16 1119  PROBNP 311.0*   HbA1C: No results for input(s): HGBA1C in the last 72 hours. CBG:  Recent Labs Lab 04/09/16 1107 04/09/16 1656 04/09/16 2049 04/10/16 0634 04/10/16 1026  GLUCAP 223* 132* 223* 116* 156*   Lipid Profile: No results for input(s): CHOL, HDL, LDLCALC, TRIG, CHOLHDL, LDLDIRECT in the last 72 hours. Thyroid Function Tests: No results for input(s): TSH, T4TOTAL, FREET4, T3FREE, THYROIDAB in the last 72 hours. Anemia Panel: No results for input(s): VITAMINB12, FOLATE, FERRITIN, TIBC, IRON, RETICCTPCT in the last 72 hours. Urine analysis:    Component Value Date/Time   COLORURINE AMBER (A) 04/03/2016 2007    APPEARANCEUR CLOUDY (A) 04/03/2016 2007   LABSPEC 1.022 04/03/2016 2007   PHURINE 5.0 04/03/2016 2007   GLUCOSEU NEGATIVE 04/03/2016 2007   HGBUR SMALL (A) 04/03/2016 2007   BILIRUBINUR NEGATIVE 04/03/2016 2007   [place] NEGATIVE 04/03/2016 2007   PROTEINUR 100 (A) 04/03/2016 2007   NITRITE NEGATIVE 04/03/2016 2007   LEUKOCYTESUR NEGATIVE 04/03/2016 2007   Sepsis Labs: Invalid input(s): PROCALCITONIN, LACTICIDVEN  Recent Results (from the past 240 hour(s))  Blood Culture (routine x 2)     Status: None   Collection Time: 04/03/16  7:01 PM  Result Value Ref Range Status   Specimen Description LEFT ANTECUBITAL  Final   Special Requests BOTTLES DRAWN AEROBIC AND ANAEROBIC 5CC  Final   Culture NO GROWTH 5 DAYS  Final   Report Status 04/08/2016 FINAL  Final  Blood Culture (routine x 2)     Status: None   Collection Time: 04/03/16  7:06 PM  Result Value Ref Range Status   Specimen Description RIGHT ANTECUBITAL  Final   Special Requests BOTTLES DRAWN AEROBIC AND ANAEROBIC 5CC  Final   Culture NO GROWTH 5 DAYS  Final   Report Status 04/08/2016 FINAL  Final  Urine culture     Status: Abnormal   Collection Time: 04/03/16  8:07 PM  Result Value Ref Range Status   Specimen Description URINE, CLEAN CATCH  Final   Special Requests NONE  Final   Culture MULTIPLE SPECIES PRESENT, SUGGEST RECOLLECTION (A)  Final   Report Status 04/05/2016 FINAL  Final  Surgical pcr screen     Status: None   Collection Time: 04/08/16  5:03 AM  Result Value Ref Range Status   MRSA, PCR NEGATIVE NEGATIVE Final   Staphylococcus aureus NEGATIVE NEGATIVE Final    Comment:        The Xpert SA Assay (FDA approved for NASAL specimens in patients over 80 years of age), is one component of a comprehensive surveillance program.  Test performance has been validated by [facility] for patients greater than or equal to 63 year old. It is not intended to diagnose infection nor to guide or monitor treatment.    Anaerobic culture     Status: None (Preliminary result)   Collection Time:  04/08/16 11:56 AM  Result Value Ref Range Status   Specimen Description FLUID LEFT PLEURAL  Final   Special Requests PATIENT ON FOLLOWING  ZOSYN AND VANCOMYCIN  Final   Culture   Final    NO ANAEROBES ISOLATED; CULTURE IN PROGRESS FOR 5 DAYS   Report Status PENDING  Incomplete  Body fluid culture     Status: None (Preliminary result)   Collection Time: 04/08/16 11:56 AM  Result Value Ref Range Status   Specimen Description FLUID LEFT PLEURAL  Final   Special Requests PATIENT ON FOLLOWING  ZOSYN AND VANCOMYCIN  Final   Gram Stain   Final    RARE WBC PRESENT, PREDOMINANTLY PMN NO ORGANISMS SEEN    Culture NO GROWTH 1 DAY  Final   Report Status PENDING  Incomplete      Radiology Studies: Dg Chest Port 1 View  Result Date: 04/10/2016 CLINICAL DATA:  Followup for left chest tube. Left pleural effusion. EXAM: PORTABLE CHEST 1 VIEW COMPARISON:  04/09/2016 FINDINGS: Left-sided pigtail catheter is stable, projecting along the lateral left mid to upper hemi thorax. There is persistent left pleural fluid and left lung base parenchymal opacity. No new lung abnormalities. No pneumothorax. IMPRESSION: 1. No change from the previous day's study. 2. Stable left chest tube.  No pneumothorax. 3. Persistent left lung base opacity with an associated pleural effusion. Electronically Signed   By: Lajean Manes M.D.   On: 04/10/2016 07:51   Dg Chest Port 1 View  Result Date: 04/09/2016 CLINICAL DATA:  Soreness in chest.  Chest tube present. EXAM: PORTABLE CHEST 1 VIEW COMPARISON:  04/08/2016.  04/03/2016. FINDINGS: Again noted is a left basilar chest tube and a small bore pigtail chest tube in the lateral upper chest. There continues to be evidence for a loculated left pleural fluid which is caudal to the pigtail catheter. Evidence for compressive atelectasis at the left lung base. Slightly increased densities in the right lower  lung. Heart size is stable. Trachea is midline. Negative for pneumothorax. IMPRESSION: No change in the loculated left pleural effusion. Stable position of the left chest tubes without a pneumothorax. Electronically Signed   By: Markus Daft M.D.   On: 04/09/2016 08:56   Dg Chest Port 1 View  Result Date: 04/08/2016 CLINICAL DATA:  Shortness of breath. EXAM: PORTABLE CHEST 1 VIEW COMPARISON:  04/08/2016.  04/07/2016. FINDINGS: PleurX catheter again noted on the left in stable position. No pneumothorax. Cardiomegaly with bilateral pulmonary interstitial prominence and left-sided pleural effusion noted consistent congestive heart failure. Similar finding noted on prior exam from earlier today. IMPRESSION: 1. Left chest tube noted in stable position. No pneumothorax. Stable left pleural effusion. 2. Persistent changes of congestive heart failure with pulmonary interstitial edema. No interim change from earlier exam today. Electronically Signed   By: Marcello Moores  Register   On: 04/08/2016 13:36   Dg Chest Portable 1 View  Result Date: 04/08/2016 CLINICAL DATA:  PleurX catheter placement. EXAM: PORTABLE CHEST 1 VIEW COMPARISON:  04/07/2016. FINDINGS: PleurX catheter on the left again noted. Small left pleural effusion again noted. No pneumothorax . Cardiomegaly with pulmonary vascular prominence and mild interstitial prominence noted. Mild component congestive heart failure cannot be excluded. IMPRESSION: 1. PleurX catheter on the left again noted. No pneumothorax. Small left pleural effusion again noted. 2. Cannot exclude mild congestive heart failure with mild interstitial edema. Electronically Signed   By: Marcello Moores  Register   On: 04/08/2016 12:31   Dg Abd Portable 2v  Result Date:  04/10/2016 CLINICAL DATA:  Abdominal pain EXAM: PORTABLE ABDOMEN - 2 VIEW COMPARISON:  None. FINDINGS: Scattered large and small bowel gas is noted. A left thoracostomy catheter is noted over the base. No obstructive changes are seen.  No free air is noted. Mild degenerative changes of the lumbar spine are seen. IMPRESSION: No acute abnormality noted. Electronically Signed   By: Inez Catalina M.D.   On: 04/10/2016 08:58     Scheduled Meds: . [START ON 05/01/2016] cyanocobalamin  1,000 mcg Intramuscular Q30 days  . dextromethorphan  30 mg Oral QHS  . doxazosin  4 mg Oral QHS  . eltrombopag  75 mg Oral QAC lunch  . ferrous sulfate  325 mg Oral QPC supper  . fluticasone  1 spray Each Nare QHS  . guaiFENesin  600 mg Oral BID  . insulin aspart  0-15 Units Subcutaneous TID WC  . insulin glargine  15 Units Subcutaneous Daily  . irbesartan  150 mg Oral Daily  . metoCLOPramide (REGLAN) injection  10 mg Intravenous Q6H  . metoprolol succinate  100 mg Oral Daily  . multivitamin with minerals  1 tablet Oral QHS  . piperacillin-tazobactam (ZOSYN)  IV  3.375 g Intravenous Q8H  . sodium chloride flush  3 mL Intravenous Q12H  . vancomycin  1,250 mg Intravenous Q24H   Continuous Infusions:     Marzetta Board, MD, PhD [facility] Pager [phone]  If 7PM-7AM, please contact night-coverage [email or web address] Password [email or web address] 04/10/2016, 11:20 AM

## 2016-04-10 NOTE — Evaluation (Signed)
Physical Therapy Evaluation Patient Details Name: Ricardo Watkins MRN: [id number] DOB: 1930/11/10 Today's Date: 04/10/2016   History of Present Illness  82 y.o. male with medical history significant of non-Hodgkin's lymphoma, CAD, type 2 diabetes, hyperlipidemia, hypertension, anemia, prostate CA, diverticulosis, cataracts, DVT, chronic cough, chronic left pleural effusion with insertion of left pleural catheter on 11/10/2015 by [name] who comes to the hospital due to fever, chills, worsening fatigue and malaise.  Patient s/p removal of pleural draingae catheter and insertion of chest tube Lt. on 04/08/16.  Clinical Impression  Patient presents with problems listed below.  Will benefit from acute PT to maximize functional mobility prior to discharge.  Patient will need to reach min guard assist level to return home safely with family.  Recommend f/u HHPT.    Follow Up Recommendations Home health PT;Supervision/Assistance - 24 hour    Equipment Recommendations  None recommended by PT    Recommendations for Other Services       Precautions / Restrictions Precautions Precautions: Fall Precaution Comments: Chest tubes Restrictions Weight Bearing Restrictions: No      Mobility  Bed Mobility Overal bed mobility: Needs Assistance;+2 for physical assistance Bed Mobility: Supine to Sit     Supine to sit: Max assist;+2 for physical assistance     General bed mobility comments: Assist to raise trunk to upright position.  Once upright, patient able to maintain static balance.  Transfers Overall transfer level: Needs assistance Equipment used: Rolling walker (2 wheeled) Transfers: Sit to/from [facility] Sit to Stand: Mod assist;+2 physical assistance Stand pivot transfers: Mod assist;Min assist;+2 physical assistance       General transfer comment: Verbal cues for hand placement.  Assist to rise to standing.  Patient able to take several shuffle steps to pivot  to chair.  Ambulation/Gait             General Gait Details: NT  Stairs            Wheelchair Mobility    Modified Rankin (Stroke Patients Only)       Balance Overall balance assessment: Needs assistance         Standing balance support: Bilateral upper extremity supported Standing balance-Leahy Scale: Poor                               Pertinent Vitals/Pain Pain Assessment: No/denies pain    Home Living Family/patient expects to be discharged to:: Private residence Living Arrangements: Spouse/significant other;Children Available Help at Discharge: Family;Available 24 hours/day (Wife and son live with patient; daughter and 3 sons live [place]) Type of Home: House Home Access: Ramped entrance     Home Layout: One level Home Equipment: Environmental consultant - 2 wheels;Cane - single point;Bedside commode;Shower seat      Prior Function Level of Independence: Independent with assistive device(s);Needs assistance   Gait / Transfers Assistance Needed: Uses cane "all the time" for gait  ADL's / Homemaking Assistance Needed: Wife assists with bathing, dressing, meal prep, housekeeping        Hand Dominance   Dominant Hand: Right    Extremity/Trunk Assessment   Upper Extremity Assessment: Generalized weakness           Lower Extremity Assessment: Generalized weakness         Communication   Communication: No difficulties  Cognition Arousal/Alertness: Lethargic;Suspect due to medications Behavior During Therapy: Flat affect Overall Cognitive Status: Difficult to assess  General Comments      Exercises     Assessment/Plan    PT Assessment Patient needs continued PT services  PT Problem List Decreased strength;Decreased activity tolerance;Decreased balance;Decreased mobility;Decreased coordination;Decreased cognition;Decreased knowledge of use of DME;Decreased safety awareness;Cardiopulmonary status limiting  activity;Obesity          PT Treatment Interventions DME instruction;Gait training;Therapeutic activities;Functional mobility training;Therapeutic exercise;Balance training;Patient/family education    PT Goals (Current goals can be found in the Care Plan section)  Acute Rehab PT Goals Patient Stated Goal: None stated PT Goal Formulation: With patient/family Time For Goal Achievement: 04/17/16 Potential to Achieve Goals: Good    Frequency Min 3X/week   Barriers to discharge        Co-evaluation               End of Session Equipment Utilized During Treatment: Gait belt Activity Tolerance: Patient limited by fatigue;Patient limited by lethargy Patient left: in chair;with call bell/phone within reach;with family/visitor present           Time: 6144-3154 PT Time Calculation (min) (ACUTE ONLY): 19 min   Charges:   PT Evaluation $PT Eval Moderate Complexity: 1 Procedure     PT G CodesDespina Pole May 06, 2016, 4:18 PM Carita Pian. Sanjuana Kava, [place] Pager [phone]

## 2016-04-10 NOTE — Progress Notes (Addendum)
      [address]       [place] [address]             [phone]      2 Days Post-Op Procedure(s) (LRB): REMOVAL OF PLEURAL DRAINAGE CATHETER (Left) CHEST TUBE INSERTION (Left)  Subjective:  No new complaints.  Continues to feel sick with oral intake  Objective: Vital signs in last 24 hours: Temp:  [98.3 F (36.8 C)-98.6 F (37 C)] 98.3 F (36.8 C) (10/21 0430) Pulse Rate:  [103-108] 103 (10/21 0430) Cardiac Rhythm: Supraventricular tachycardia (10/20 2303) Resp:  [17-18] 18 (10/20 2055) BP: (120-149)/(51-68) 120/51 (10/21 0430) SpO2:  [96 %] 96 % (10/21 0430)  Intake/Output from previous day: 10/20 0701 - 10/21 0700 In: 1230 [P.O.:1080; IV Piggyback:150] Out: 554 [Urine:400; Chest Tube:154]  General appearance: alert, cooperative and no distress Heart: regular rate and rhythm Lungs: diminished breath sounds on left Wound: clean and dry  Lab Results:  Recent Labs  04/09/16 0419 04/10/16 0430  WBC 25.2* 23.9*  HGB 8.2* 7.6*  HCT 28.6* 26.1*  PLT 75* 85*   BMET:  Recent Labs  04/09/16 0419 04/10/16 0430  NA 139 138  K 3.5 3.5  CL 106 105  CO2 27 27  GLUCOSE 111* 124*  BUN 9 14  CREATININE 1.62* 1.90*  CALCIUM 7.9* 7.7*    PT/INR: No results for input(s): LABPROT, INR in the last 72 hours. ABG No results found for: PHART, HCO3, TCO2, ACIDBASEDEF, O2SAT CBG (last 3)   Recent Labs  04/09/16 1656 04/09/16 2049 04/10/16 0634  GLUCAP 132* 223* 116*    Assessment/Plan: S/P Procedure(s) (LRB): REMOVAL OF PLEURAL DRAINAGE CATHETER (Left) CHEST TUBE INSERTION (Left)  1. Chest tube- output 100 ml yesterday, no air leak present.... CXR remains unchanged in appearance of loculated pleural effusion 2. GI- nausea, continue anti-emetics 3. CV- Sinus tach on Toprol XL, Avapro for BP 4. Dispo-will leave both chest tubes today, care per medicine   LOS: 7 days    BARRETT, ERIN 04/10/2016  I have seen and examined the patient  and agree with the assessment and plan as outlined.  Rexene Alberts, MD 04/10/2016 1:53 PM

## 2016-04-11 LAB — BASIC METABOLIC PANEL
ANION GAP: 8 (ref 5–15)
BUN: 19 mg/dL (ref 6–20)
CHLORIDE: 106 mmol/L (ref 101–111)
CO2: 26 mmol/L (ref 22–32)
CREATININE: 2.1 mg/dL — AB (ref 0.61–1.24)
Calcium: 7.8 mg/dL — ABNORMAL LOW (ref 8.9–10.3)
GFR calc non Af Amer: 27 mL/min — ABNORMAL LOW (ref >60)
GFR, EST AFRICAN AMERICAN: 32 mL/min — AB (ref >60)
Glucose, Bld: 94 mg/dL (ref 65–99)
POTASSIUM: 3.5 mmol/L (ref 3.5–5.1)
SODIUM: 140 mmol/L (ref 135–145)

## 2016-04-11 LAB — CBC
HCT: 26.3 % — ABNORMAL LOW (ref 39.0–52.0)
HEMOGLOBIN: 7.7 g/dL — AB (ref 13.0–17.0)
MCH: 23.3 pg — ABNORMAL LOW (ref 26.0–34.0)
MCHC: 29.3 g/dL — ABNORMAL LOW (ref 30.0–36.0)
MCV: 79.5 fL (ref 78.0–100.0)
Platelets: 99 10*3/uL — ABNORMAL LOW (ref 150–400)
RBC: 3.31 MIL/uL — AB (ref 4.22–5.81)
RDW: 23.9 % — ABNORMAL HIGH (ref 11.5–15.5)
WBC: 17.3 10*3/uL — AB (ref 4.0–10.5)

## 2016-04-11 LAB — GLUCOSE, CAPILLARY
GLUCOSE-CAPILLARY: 101 mg/dL — AB (ref 65–99)
GLUCOSE-CAPILLARY: 82 mg/dL (ref 65–99)
GLUCOSE-CAPILLARY: 221 mg/dL — AB (ref 65–99)
Glucose-Capillary: 145 mg/dL — ABNORMAL HIGH (ref 65–99)
Glucose-Capillary: 118 mg/dL — ABNORMAL HIGH (ref 65–99)

## 2016-04-11 NOTE — Progress Notes (Signed)
Lower left chest tube removed without complication.  Will continue to monitor.

## 2016-04-11 NOTE — Progress Notes (Signed)
PROGRESS NOTE  Ricardo Watkins ZOX:[id number] DOB: 1930-11-24 DOA: 04/03/2016 PCP: [name], MD   LOS: 8 days   Brief Narrative: 82 y.o.malewith medical history significant of non-Hodgkin's lymphoma, CAD, type 2 diabetes, hyperlipidemia, hypertension, anemia, prostate CA, diverticulosis, cataracts, DVT, chronic cough, chronic left pleural effusion with insertion of left pleural catheter on 11/10/2015 by [name] who comes to the hospital due to fever, chills, worsening fatigue and malaise. Per patient, his daughter and wife, his chest tube drainage has been decreasing his output for the pastseveral days and he stopped completely the day PTA. He normally has up to 500 mL of drainage every other day. Patient was worked up and found to be septic and have a loculated effusion.   Assessment & Plan: Principal Problem:   Empyema ([place]) Active Problems:   CAD (coronary artery disease)   History of lymphoma   Essential hypertension   Thrombocytopenia (HCC)   Microcytic anemia   Type 2 diabetes mellitus (HCC)   Empyema lung (HCC)   Sepsis due to loculated Effusion/Empyema from Left Obstructed Pleural Catheter for recurrent and chronic Non Malignant Pleural Effusions with hypoxic respiratory failure - Sepsis physiology resolved; Old left PleurX catheter removed by TCV 10/19 - continue IV Vancomycin and Zosyn for now. Cultures negative - CT Scan on admission showed Moderate-sized loculated left pleural effusion collection with a PleurX drainage catheter in place. - Cardiothoracic Surgery evaluated and will continue to follow; IR placed CT Guided Fluid Drain by PigTail Catheter 10/16 and drained ~550 mL. Patient taken to OR 10/19 with removal of old catheter and placement of a left sided chest tube  - Continue Supplemental Oxygen and wean  - doing well post op, has back pain at chest tube site - post op had significant leukocytosis, up to 25 >> 23.9 >>17.3. On Vancomycin and Zosyn -  remove chest tube today per TCV - d/c Vancomycin today, MRS PCR negative  Hx of Non-Hodgkins Lymphoma - PET-CT Scan showed Enlarging hypermetabolic mediastinal lymph nodes suggesting recurrent lymphoma. - Patient was seen by Hematologist/Oncologist [name], will have close outpatient follow up  - Continue scheduled follow-ups with Hematology/Oncology after empyema treatment.  CKD III - baseline Cr 1.3 - 1.7, closely monitor, his Cr has been slowly increasing since his surgery 2 days ago. He has good UOP per RN, is incontinent at times and cannot quantify.  - Cr 2 today. D/c vancomycin, hold Avapro  Abdominal pain - patient with abdominal pain, states that he occasionally gets these at home. Will monitor closely give elevated WBC (althought leukocytosis was temporary related to his surgery) - plain films normal  MDS (myelodysplastic syndrome) with 5q deletion - Followed by Hematology/Oncology; Seen by [name] today - Continue Promacta. Patient's Hematologist increased it to 75 mg and will reassess next visit.  Hypokalemia - Improved, replete prn  Insulin Dependent Type 2 Diabetes Mellitus  - Continue Lantus and SSI, CBGs controlled 80-100s  CAD  - Continue Metoprolol 100 mg by mouth daily and Irbesartan 150 mg po daily - Clopidogrel was helped by Hematology/Oncology because of Thrombocytopenia.  Essential Hypertension - Continue Metoprolol 100 mg po daily and Irbesartan 150 mg po daily. - Monitor blood pressure, BUN/creatinine and electrolytes. Stable today   Thrombocytopenia  - Platelet Count stable now - SCDs for DVT prophylaxis. - Continue Promacta.  Patient's Hematologist increased it to 75 mg and will reassess next visit. - Hematology recommended stopping Clopidogrel; P2Y12 from 10/15 WNL  Microcytic Anemia - C/w  Ferrous Sulfate 325 mg tablet po  - monitor, Hb trending down, no s/s of bleeding. Transfuse for Hb < 7   DVT prophylaxis: SCD Code Status:  Full Family Communication: no family bedside Disposition Plan: home when ready  Consultants:   Cardiothoracic surgery  IR  HemOnc  Procedures:   CT Guided IR Pigtail Cathether Drain  Antimicrobials:  Vancomycin 10/14 >> 10/22  Zosyn 10/14 >>  Subjective: - feels overall a bit better.  Objective: Vitals:   04/10/16 0430 04/10/16 1208 04/10/16 1939 04/11/16 0429  BP: (!) 120/51 (!) 128/56 (!) 164/65 (!) 121/48  Pulse: (!) 103 (!) 103 (!) 113 (!) 105  Resp:  18 20 20   Temp: 98.3 F (36.8 C) 98.2 F (36.8 C) 99.9 F (37.7 C) 100 F (37.8 C)  TempSrc: Oral Oral Oral Oral  SpO2: 96% 95% 96% 94%  Weight:      Height:        Intake/Output Summary (Last 24 hours) at 04/11/16 1025 Last data filed at 04/11/16 1000  Gross per 24 hour  Intake              342 ml  Output              558 ml  Net             -216 ml   Filed Weights   04/03/16 1946 04/03/16 2026 04/04/16 1000  Weight: 85.3 kg (188 lb) 85.3 kg (188 lb) 85.5 kg (188 lb 9.6 oz)    Examination: Constitutional: NAD Vitals:   04/10/16 0430 04/10/16 1208 04/10/16 1939 04/11/16 0429  BP: (!) 120/51 (!) 128/56 (!) 164/65 (!) 121/48  Pulse: (!) 103 (!) 103 (!) 113 (!) 105  Resp:  18 20 20   Temp: 98.3 F (36.8 C) 98.2 F (36.8 C) 99.9 F (37.7 C) 100 F (37.8 C)  TempSrc: Oral Oral Oral Oral  SpO2: 96% 95% 96% 94%  Weight:      Height:       Eyes: PERRL, lids and conjunctivae normal ENMT: Mucous membranes are moist. Respiratory: decreased breath sounds at the bases, no wheezing, no crackles. Pigtail catheter / chest tube in place Cardiovascular: Regular rate and rhythm, 3/6 SEM. No LE edema Abdomen: no tenderness. Bowel sounds positive.  Musculoskeletal: no clubbing / cyanosis.  Neurologic: non focal   Data Reviewed: I have personally reviewed following labs and imaging studies  CBC:  Recent Labs Lab 04/05/16 0209 04/06/16 0215 04/07/16 0227 04/08/16 0231 04/09/16 0419 04/10/16 0430  04/11/16 0249  WBC 10.6* 10.3 11.0* 11.9* 25.2* 23.9* 17.3*  NEUTROABS 7.0 6.9 7.3 8.6*  --  18.4*  --   HGB 8.7* 9.5* 8.8* 8.5* 8.2* 7.6* 7.7*  HCT 29.5* 32.1* 30.4* 28.9* 28.6* 26.1* 26.3*  MCV 79.1 80.0 80.0 79.4 80.6 79.6 79.5  PLT 68* 84* 76* 74* 75* 85* 99*   Basic Metabolic Panel:  Recent Labs Lab 04/07/16 0227 04/08/16 0231 04/09/16 0419 04/10/16 0430 04/11/16 0249  NA 137 139 139 138 140  K 3.3* 3.1* 3.5 3.5 3.5  CL 104 107 106 105 106  CO2 24 26 27 27 26   GLUCOSE 109* 100* 111* 124* 94  BUN 7 8 9 14 19   CREATININE 1.34* 1.35* 1.62* 1.90* 2.10*  CALCIUM 8.0* 7.7* 7.9* 7.7* 7.8*  MG 1.9  --   --   --   --   PHOS 2.6  --   --   --   --  GFR: Estimated Creatinine Clearance: 27.9 mL/min (by C-G formula based on SCr of 2.1 mg/dL (H)). Liver Function Tests:  Recent Labs Lab 04/05/16 0209 04/09/16 0419 04/10/16 0430  AST 14* 17 14*  ALT 14* 14* 13*  ALKPHOS 56 62 59  BILITOT 0.5 0.4 0.4  PROT 6.9 6.7 6.4*  ALBUMIN 2.0* 1.8* 1.6*   No results for input(s): LIPASE, AMYLASE in the last 168 hours. No results for input(s): AMMONIA in the last 168 hours. Coagulation Profile: No results for input(s): INR, PROTIME in the last 168 hours. Cardiac Enzymes: No results for input(s): CKTOTAL, CKMB, CKMBINDEX, TROPONINI in the last 168 hours. BNP (last 3 results)  Recent Labs  01/27/16 1119  PROBNP 311.0*   HbA1C: No results for input(s): HGBA1C in the last 72 hours. CBG:  Recent Labs Lab 04/10/16 1026 04/10/16 1611 04/10/16 2105 04/11/16 0135 04/11/16 0614  GLUCAP 156* 175* 133* 101* 82   Lipid Profile: No results for input(s): CHOL, HDL, LDLCALC, TRIG, CHOLHDL, LDLDIRECT in the last 72 hours. Thyroid Function Tests: No results for input(s): TSH, T4TOTAL, FREET4, T3FREE, THYROIDAB in the last 72 hours. Anemia Panel: No results for input(s): VITAMINB12, FOLATE, FERRITIN, TIBC, IRON, RETICCTPCT in the last 72 hours. Urine analysis:    Component Value  Date/Time   COLORURINE AMBER (A) 04/03/2016 2007   APPEARANCEUR CLOUDY (A) 04/03/2016 2007   LABSPEC 1.022 04/03/2016 2007   PHURINE 5.0 04/03/2016 2007   GLUCOSEU NEGATIVE 04/03/2016 2007   HGBUR SMALL (A) 04/03/2016 2007   BILIRUBINUR NEGATIVE 04/03/2016 2007   [place] NEGATIVE 04/03/2016 2007   PROTEINUR 100 (A) 04/03/2016 2007   NITRITE NEGATIVE 04/03/2016 2007   LEUKOCYTESUR NEGATIVE 04/03/2016 2007   Sepsis Labs: Invalid input(s): PROCALCITONIN, LACTICIDVEN  Recent Results (from the past 240 hour(s))  Blood Culture (routine x 2)     Status: None   Collection Time: 04/03/16  7:01 PM  Result Value Ref Range Status   Specimen Description LEFT ANTECUBITAL  Final   Special Requests BOTTLES DRAWN AEROBIC AND ANAEROBIC 5CC  Final   Culture NO GROWTH 5 DAYS  Final   Report Status 04/08/2016 FINAL  Final  Blood Culture (routine x 2)     Status: None   Collection Time: 04/03/16  7:06 PM  Result Value Ref Range Status   Specimen Description RIGHT ANTECUBITAL  Final   Special Requests BOTTLES DRAWN AEROBIC AND ANAEROBIC 5CC  Final   Culture NO GROWTH 5 DAYS  Final   Report Status 04/08/2016 FINAL  Final  Urine culture     Status: Abnormal   Collection Time: 04/03/16  8:07 PM  Result Value Ref Range Status   Specimen Description URINE, CLEAN CATCH  Final   Special Requests NONE  Final   Culture MULTIPLE SPECIES PRESENT, SUGGEST RECOLLECTION (A)  Final   Report Status 04/05/2016 FINAL  Final  Surgical pcr screen     Status: None   Collection Time: 04/08/16  5:03 AM  Result Value Ref Range Status   MRSA, PCR NEGATIVE NEGATIVE Final   Staphylococcus aureus NEGATIVE NEGATIVE Final    Comment:        The Xpert SA Assay (FDA approved for NASAL specimens in patients over 2 years of age), is one component of a comprehensive surveillance program.  Test performance has been validated by [facility] for patients greater than or equal to 40 year old. It is not intended to  diagnose infection nor to guide or monitor treatment.   Anaerobic culture  Status: None (Preliminary result)   Collection Time: 04/08/16 11:56 AM  Result Value Ref Range Status   Specimen Description FLUID LEFT PLEURAL  Final   Special Requests PATIENT ON FOLLOWING  ZOSYN AND VANCOMYCIN  Final   Culture   Final    NO ANAEROBES ISOLATED; CULTURE IN PROGRESS FOR 5 DAYS   Report Status PENDING  Incomplete  Body fluid culture     Status: None (Preliminary result)   Collection Time: 04/08/16 11:56 AM  Result Value Ref Range Status   Specimen Description FLUID LEFT PLEURAL  Final   Special Requests PATIENT ON FOLLOWING  ZOSYN AND VANCOMYCIN  Final   Gram Stain   Final    RARE WBC PRESENT, PREDOMINANTLY PMN NO ORGANISMS SEEN    Culture NO GROWTH 1 DAY  Final   Report Status PENDING  Incomplete      Radiology Studies: Dg Chest Port 1 View  Result Date: 04/10/2016 CLINICAL DATA:  Followup for left chest tube. Left pleural effusion. EXAM: PORTABLE CHEST 1 VIEW COMPARISON:  04/09/2016 FINDINGS: Left-sided pigtail catheter is stable, projecting along the lateral left mid to upper hemi thorax. There is persistent left pleural fluid and left lung base parenchymal opacity. No new lung abnormalities. No pneumothorax. IMPRESSION: 1. No change from the previous day's study. 2. Stable left chest tube.  No pneumothorax. 3. Persistent left lung base opacity with an associated pleural effusion. Electronically Signed   By: Lajean Manes M.D.   On: 04/10/2016 07:51   Dg Abd Portable 2v  Result Date: 04/10/2016 CLINICAL DATA:  Abdominal pain EXAM: PORTABLE ABDOMEN - 2 VIEW COMPARISON:  None. FINDINGS: Scattered large and small bowel gas is noted. A left thoracostomy catheter is noted over the base. No obstructive changes are seen. No free air is noted. Mild degenerative changes of the lumbar spine are seen. IMPRESSION: No acute abnormality noted. Electronically Signed   By: Inez Catalina M.D.   On:  04/10/2016 08:58     Scheduled Meds: . [START ON 05/01/2016] cyanocobalamin  1,000 mcg Intramuscular Q30 days  . dextromethorphan  30 mg Oral QHS  . doxazosin  4 mg Oral QHS  . eltrombopag  75 mg Oral QAC lunch  . ferrous sulfate  325 mg Oral QPC supper  . fluticasone  1 spray Each Nare QHS  . guaiFENesin  600 mg Oral BID  . insulin aspart  0-15 Units Subcutaneous TID WC  . insulin glargine  15 Units Subcutaneous Daily  . metoprolol succinate  100 mg Oral Daily  . multivitamin with minerals  1 tablet Oral QHS  . piperacillin-tazobactam (ZOSYN)  IV  3.375 g Intravenous Q8H  . sodium chloride flush  3 mL Intravenous Q12H  . vancomycin  1,250 mg Intravenous Q24H   Continuous Infusions:     Marzetta Board, MD, PhD [facility] Pager [phone]  If 7PM-7AM, please contact night-coverage [email or web address] Password [email or web address] 04/11/2016, 10:25 AM

## 2016-04-11 NOTE — Progress Notes (Addendum)
      [address]       [place] [address]             [phone]      3 Days Post-Op Procedure(s) (LRB): REMOVAL OF PLEURAL DRAINAGE CATHETER (Left) CHEST TUBE INSERTION (Left)   Subjective:  No new complaints.  Objective: Vital signs in last 24 hours: Temp:  [98.2 F (36.8 C)-100 F (37.8 C)] 100 F (37.8 C) (10/22 0429) Pulse Rate:  [103-113] 105 (10/22 0429) Cardiac Rhythm: Atrial fibrillation (10/22 0145) Resp:  [18-20] 20 (10/22 0429) BP: (121-164)/(48-65) 121/48 (10/22 0429) SpO2:  [94 %-96 %] 94 % (10/22 0429)  Intake/Output from previous day: 10/21 0701 - 10/22 0700 In: 342 [P.O.:342] Out: 358 [Urine:300; Chest Tube:58]  General appearance: alert, cooperative and no distress Heart: regular rate and rhythm Lungs: diminished breath sounds bibasilar Abdomen: soft, non-tender; bowel sounds normal; no masses,  no organomegaly Wound: clean and dry  Lab Results:  Recent Labs  04/10/16 0430 04/11/16 0249  WBC 23.9* 17.3*  HGB 7.6* 7.7*  HCT 26.1* 26.3*  PLT 85* 99*   BMET:  Recent Labs  04/10/16 0430 04/11/16 0249  NA 138 140  K 3.5 3.5  CL 105 106  CO2 27 26  GLUCOSE 124* 94  BUN 14 19  CREATININE 1.90* 2.10*  CALCIUM 7.7* 7.8*    PT/INR: No results for input(s): LABPROT, INR in the last 72 hours. ABG No results found for: PHART, HCO3, TCO2, ACIDBASEDEF, O2SAT CBG (last 3)   Recent Labs  04/10/16 2105 04/11/16 0135 04/11/16 0614  GLUCAP 133* 101* 82    Assessment/Plan: S/P Procedure(s) (LRB): REMOVAL OF PLEURAL DRAINAGE CATHETER (Left) CHEST TUBE INSERTION (Left)  1. Chest tube- output remains low, 112 cc output yesterday.. Will d/c chest tube today.. Leave pigtail until dry 2. GI- nausea, anti-emetics prn 3. CV- sinus tach on Toprol XL, Avapro BP 4. Dispo- d/c chest tube today... Leave pigtail until dry   LOS: 8 days    BARRETT, ERIN 04/11/2016  I have seen and examined the patient and agree with the  assessment and plan as outlined.  Rexene Alberts, MD 04/11/2016 12:15 PM

## 2016-04-11 NOTE — Progress Notes (Signed)
MD made aware of lab notification; pleural fluid from 04/08/16 growing rare coag negative Staph.

## 2016-04-12 ENCOUNTER — Inpatient Hospital Stay (HOSPITAL_COMMUNITY): Payer: Medicare Other

## 2016-04-12 LAB — BASIC METABOLIC PANEL
Anion gap: 7 (ref 5–15)
BUN: 21 mg/dL — AB (ref 6–20)
CALCIUM: 8 mg/dL — AB (ref 8.9–10.3)
CO2: 29 mmol/L (ref 22–32)
CREATININE: 2.16 mg/dL — AB (ref 0.61–1.24)
Chloride: 105 mmol/L (ref 101–111)
GFR calc Af Amer: 31 mL/min — ABNORMAL LOW (ref >60)
GFR, EST NON AFRICAN AMERICAN: 27 mL/min — AB (ref >60)
GLUCOSE: 90 mg/dL (ref 65–99)
Potassium: 3.9 mmol/L (ref 3.5–5.1)
Sodium: 141 mmol/L (ref 135–145)

## 2016-04-12 LAB — ANAEROBIC CULTURE

## 2016-04-12 LAB — GLUCOSE, CAPILLARY
GLUCOSE-CAPILLARY: 232 mg/dL — AB (ref 65–99)
Glucose-Capillary: 90 mg/dL (ref 65–99)
Glucose-Capillary: 80 mg/dL (ref 65–99)
Glucose-Capillary: 202 mg/dL — ABNORMAL HIGH (ref 65–99)
Glucose-Capillary: 138 mg/dL — ABNORMAL HIGH (ref 65–99)

## 2016-04-12 LAB — BODY FLUID CULTURE

## 2016-04-12 LAB — CBC
HEMATOCRIT: 27.6 % — AB (ref 39.0–52.0)
Hemoglobin: 7.8 g/dL — ABNORMAL LOW (ref 13.0–17.0)
MCH: 22.9 pg — AB (ref 26.0–34.0)
MCHC: 28.3 g/dL — AB (ref 30.0–36.0)
MCV: 81.2 fL (ref 78.0–100.0)
PLATELETS: 113 10*3/uL — AB (ref 150–400)
RBC: 3.4 MIL/uL — ABNORMAL LOW (ref 4.22–5.81)
RDW: 23.8 % — AB (ref 11.5–15.5)
WBC: 16.6 10*3/uL — AB (ref 4.0–10.5)

## 2016-04-12 NOTE — Progress Notes (Signed)
Physical Therapy Treatment Patient Details Name: Ricardo Watkins MRN: [id number] DOB: 12-30-1930 Today's Date: 04/12/2016    History of Present Illness 82 y.o. male with medical history significant of non-Hodgkin's lymphoma, CAD, type 2 diabetes, hyperlipidemia, hypertension, anemia, prostate CA, diverticulosis, cataracts, DVT, chronic cough, chronic left pleural effusion with insertion of left pleural catheter on 11/10/2015 by [name] who comes to the hospital due to fever, chills, worsening fatigue and malaise.  Patient s/p removal of pleural draingae catheter and insertion of chest tube Lt. on 04/08/16.    PT Comments    Pt with excellent progression with mobility today and able to ambulate in hallway on 3L. Pt educated for transfers, RW use, HEP and progression with continued encouragement to mobilize with nursing staff. Pt with sats dropping to 85% on 3L with gait with cues for breathing technique to recover to 91%, HR 118  Follow Up Recommendations  Home health PT;Supervision/Assistance - 24 hour     Equipment Recommendations       Recommendations for Other Services       Precautions / Restrictions Precautions Precautions: Fall Restrictions Weight Bearing Restrictions: No    Mobility  Bed Mobility Overal bed mobility: Needs Assistance Bed Mobility: Supine to Sit           General bed mobility comments: cues for sequence with use of rail and increased time to achieve  Transfers Overall transfer level: Needs assistance   Transfers: Sit to/from Stand Sit to Stand: Min assist         General transfer comment: cues for hand placement  Ambulation/Gait Ambulation/Gait assistance: Min assist Ambulation Distance (Feet): 180 Feet Assistive device: Rolling walker (2 wheeled) Gait Pattern/deviations: Step-through pattern;Decreased stride length;Trunk flexed   Gait velocity interpretation: Below normal speed for age/gender General Gait Details: cues for posture,  position in RW and breathing technique   Stairs            Wheelchair Mobility    Modified Rankin (Stroke Patients Only)       Balance             Standing balance-Leahy Scale: Good                      Cognition Arousal/Alertness: Awake/alert Behavior During Therapy: WFL for tasks assessed/performed Overall Cognitive Status: Within Functional Limits for tasks assessed                      Exercises General Exercises - Lower Extremity Long Arc Quad: AROM;Both;Seated;15 reps Hip Flexion/Marching: AROM;Both;10 reps;Seated    General Comments        Pertinent Vitals/Pain Pain Assessment: 0-10 Pain Score: 5  Pain Location: head and stomach Pain Descriptors / Indicators: Aching Pain Intervention(s): Limited activity within patient's tolerance;Monitored during session    Home Living                      Prior Function            PT Goals (current goals can now be found in the care plan section) Progress towards PT goals: Goals met and updated - see care plan    Frequency           PT Plan Current plan remains appropriate    Co-evaluation             End of Session Equipment Utilized During Treatment: Gait belt;Oxygen Activity Tolerance: Patient tolerated treatment well Patient left:  in chair;with call bell/phone within reach;with chair alarm set;with family/visitor present     Time: 8628-2417 PT Time Calculation (min) (ACUTE ONLY): 25 min  Charges:  $Gait Training: 8-22 mins $Therapeutic Exercise: 8-22 mins                    G Codes:      Melford Aase 04-May-2016, 1:14 PM  Elwyn Reach, [place]

## 2016-04-12 NOTE — Progress Notes (Signed)
PROGRESS NOTE  Ricardo Watkins SPQ:[id number] DOB: 17-May-1931 DOA: 04/03/2016 PCP: [name], MD   LOS: 9 days   Brief Narrative: 82 y.o.malewith medical history significant of non-Hodgkin's lymphoma, CAD, type 2 diabetes, hyperlipidemia, hypertension, anemia, prostate CA, diverticulosis, cataracts, DVT, chronic cough, chronic left pleural effusion with insertion of left pleural catheter on 11/10/2015 by [name] who comes to the hospital due to fever, chills, worsening fatigue and malaise. Per patient, his daughter and wife, his chest tube drainage has been decreasing his output for the pastseveral days and he stopped completely the day PTA. He normally has up to 500 mL of drainage every other day. Patient was worked up and found to be septic and have a loculated effusion.   Assessment & Plan: Principal Problem:   Empyema ([name]) Active Problems:   CAD (coronary artery disease)   History of lymphoma   Essential hypertension   Thrombocytopenia (HCC)   Microcytic anemia   Type 2 diabetes mellitus (HCC)   Empyema lung (HCC)   Sepsis due to loculated Effusion/Empyema from Left Obstructed Pleural Catheter for recurrent and chronic Non Malignant Pleural Effusions with hypoxic respiratory failure - Sepsis physiology resolved; Old left PleurX catheter removed by TCV 10/19 - continue IV Vancomycin and Zosyn for now. Cultures negative - CT Scan on admission showed Moderate-sized loculated left pleural effusion collection with a PleurX drainage catheter in place. - Cardiothoracic Surgery evaluated and will continue to follow; IR placed CT Guided Fluid Drain by PigTail Catheter 10/16 and drained ~550 mL. Patient taken to OR 10/19 with removal of old catheter and placement of a left sided chest tube  - Continue Supplemental Oxygen and wean  - doing well post op, has back pain at chest tube site - post op had significant leukocytosis, up to 25 >> 23.9 >>17.3>> - remove chest tube 10/22  per TCV - d/c Vancomycin 10/22, MRS PCR negative  Hx of Non-Hodgkins Lymphoma - PET-CT Scan showed Enlarging hypermetabolic mediastinal lymph nodes suggesting recurrent lymphoma. - Patient was seen by Hematologist/Oncologist [name], will have close outpatient follow up  - Continue scheduled follow-ups with Hematology/Oncology after empyema treatment.  CKD III - baseline Cr 1.3 - 1.7, closely monitor, his Cr has been slowly increasing since his surgery 2 days ago. He has good UOP per RN, is incontinent at times and cannot quantify.  - Cr stable today. D/c vancomycin, held Avapro. Expect to start to improve  Abdominal pain - patient with abdominal pain, states that he occasionally gets these at home. Will monitor closely give elevated WBC (althought leukocytosis was temporary related to his surgery) - plain films normal  MDS (myelodysplastic syndrome) with 5q deletion - Followed by Hematology/Oncology; Seen by [name] today - Continue Promacta. Patient's Hematologist increased it to 75 mg and will reassess next visit.  Hypokalemia - Improved, replete prn  Insulin Dependent Type 2 Diabetes Mellitus  - Continue Lantus and SSI, CBGs controlled 80-90s  CAD  - Continue Metoprolol 100 mg by mouth daily and his Irbesartan was held due to renal function  - Clopidogrel was helped by Hematology/Oncology because of Thrombocytopenia.  Essential Hypertension - Continue Metoprolol 100 mg po daily - Monitor blood pressure, BUN/creatinine and electrolytes. Stable today   Thrombocytopenia  - Platelet Count stable now, increasing  - SCDs for DVT prophylaxis. - Continue Promacta.  Patient's Hematologist increased it to 75 mg and will reassess next visit. - Hematology recommended stopping Clopidogrel; P2Y12 from 10/15 WNL  Microcytic Anemia -  C/w Ferrous Sulfate 325 mg tablet po  - monitor, Hb stable, no s/s of bleeding. Transfuse for Hb < 7   DVT prophylaxis: SCD Code Status:  Full Family Communication: no family bedside Disposition Plan: home when ready  Consultants:   Cardiothoracic surgery  IR  HemOnc  Procedures:   CT Guided IR Pigtail Cathether Drain  Antimicrobials:  Vancomycin 10/14 >> 10/22  Zosyn 10/14 >>  Subjective: - feels overall a bit better.  Objective: Vitals:   04/11/16 0429 04/11/16 1440 04/11/16 2009 04/12/16 0413  BP: (!) 121/48 132/65 (!) 146/58 131/62  Pulse: (!) 105 66 91 99  Resp: 20 20 20 20   Temp: 100 F (37.8 C) 99 F (37.2 C) 99.3 F (37.4 C) 98.5 F (36.9 C)  TempSrc: Oral Oral Oral Oral  SpO2: 94% 96% 98% 99%  Weight:      Height:        Intake/Output Summary (Last 24 hours) at 04/12/16 1018 Last data filed at 04/11/16 1251  Gross per 24 hour  Intake                0 ml  Output              130 ml  Net             -130 ml   Filed Weights   04/03/16 1946 04/03/16 2026 04/04/16 1000  Weight: 85.3 kg (188 lb) 85.3 kg (188 lb) 85.5 kg (188 lb 9.6 oz)    Examination: Constitutional: NAD Vitals:   04/11/16 0429 04/11/16 1440 04/11/16 2009 04/12/16 0413  BP: (!) 121/48 132/65 (!) 146/58 131/62  Pulse: (!) 105 66 91 99  Resp: 20 20 20 20   Temp: 100 F (37.8 C) 99 F (37.2 C) 99.3 F (37.4 C) 98.5 F (36.9 C)  TempSrc: Oral Oral Oral Oral  SpO2: 94% 96% 98% 99%  Weight:      Height:       Eyes: PERRL, lids and conjunctivae normal ENMT: Mucous membranes are moist. Respiratory: decreased breath sounds at the bases, no wheezing, no crackles. Pigtail catheter / chest tube in place Cardiovascular: Regular rate and rhythm, 3/6 SEM. No LE edema Abdomen: no tenderness. Bowel sounds positive.  Musculoskeletal: no clubbing / cyanosis.  Neurologic: non focal   Data Reviewed: I have personally reviewed following labs and imaging studies  CBC:  Recent Labs Lab 04/06/16 0215 04/07/16 0227 04/08/16 0231 04/09/16 0419 04/10/16 0430 04/11/16 0249 04/12/16 0215  WBC 10.3 11.0* 11.9* 25.2* 23.9*  17.3* 16.6*  NEUTROABS 6.9 7.3 8.6*  --  18.4*  --   --   HGB 9.5* 8.8* 8.5* 8.2* 7.6* 7.7* 7.8*  HCT 32.1* 30.4* 28.9* 28.6* 26.1* 26.3* 27.6*  MCV 80.0 80.0 79.4 80.6 79.6 79.5 81.2  PLT 84* 76* 74* 75* 85* 99* 284*   Basic Metabolic Panel:  Recent Labs Lab 04/07/16 0227 04/08/16 0231 04/09/16 0419 04/10/16 0430 04/11/16 0249 04/12/16 0215  NA 137 139 139 138 140 141  K 3.3* 3.1* 3.5 3.5 3.5 3.9  CL 104 107 106 105 106 105  CO2 24 26 27 27 26 29   GLUCOSE 109* 100* 111* 124* 94 90  BUN 7 8 9 14 19  21*  CREATININE 1.34* 1.35* 1.62* 1.90* 2.10* 2.16*  CALCIUM 8.0* 7.7* 7.9* 7.7* 7.8* 8.0*  MG 1.9  --   --   --   --   --   PHOS 2.6  --   --   --   --   --  GFR: Estimated Creatinine Clearance: 27.1 mL/min (by C-G formula based on SCr of 2.16 mg/dL (H)). Liver Function Tests:  Recent Labs Lab 04/09/16 0419 04/10/16 0430  AST 17 14*  ALT 14* 13*  ALKPHOS 62 59  BILITOT 0.4 0.4  PROT 6.7 6.4*  ALBUMIN 1.8* 1.6*   No results for input(s): LIPASE, AMYLASE in the last 168 hours. No results for input(s): AMMONIA in the last 168 hours. Coagulation Profile: No results for input(s): INR, PROTIME in the last 168 hours. Cardiac Enzymes: No results for input(s): CKTOTAL, CKMB, CKMBINDEX, TROPONINI in the last 168 hours. BNP (last 3 results)  Recent Labs  01/27/16 1119  PROBNP 311.0*   HbA1C: No results for input(s): HGBA1C in the last 72 hours. CBG:  Recent Labs Lab 04/11/16 1102 04/11/16 1620 04/11/16 2036 04/12/16 0424 04/12/16 0646  GLUCAP 221* 145* 118* 90 80   Lipid Profile: No results for input(s): CHOL, HDL, LDLCALC, TRIG, CHOLHDL, LDLDIRECT in the last 72 hours. Thyroid Function Tests: No results for input(s): TSH, T4TOTAL, FREET4, T3FREE, THYROIDAB in the last 72 hours. Anemia Panel: No results for input(s): VITAMINB12, FOLATE, FERRITIN, TIBC, IRON, RETICCTPCT in the last 72 hours. Urine analysis:    Component Value Date/Time   COLORURINE AMBER  (A) 04/03/2016 2007   APPEARANCEUR CLOUDY (A) 04/03/2016 2007   LABSPEC 1.022 04/03/2016 2007   PHURINE 5.0 04/03/2016 2007   GLUCOSEU NEGATIVE 04/03/2016 2007   HGBUR SMALL (A) 04/03/2016 2007   BILIRUBINUR NEGATIVE 04/03/2016 2007   [place] NEGATIVE 04/03/2016 2007   PROTEINUR 100 (A) 04/03/2016 2007   NITRITE NEGATIVE 04/03/2016 2007   LEUKOCYTESUR NEGATIVE 04/03/2016 2007   Sepsis Labs: Invalid input(s): PROCALCITONIN, LACTICIDVEN  Recent Results (from the past 240 hour(s))  Blood Culture (routine x 2)     Status: None   Collection Time: 04/03/16  7:01 PM  Result Value Ref Range Status   Specimen Description LEFT ANTECUBITAL  Final   Special Requests BOTTLES DRAWN AEROBIC AND ANAEROBIC 5CC  Final   Culture NO GROWTH 5 DAYS  Final   Report Status 04/08/2016 FINAL  Final  Blood Culture (routine x 2)     Status: None   Collection Time: 04/03/16  7:06 PM  Result Value Ref Range Status   Specimen Description RIGHT ANTECUBITAL  Final   Special Requests BOTTLES DRAWN AEROBIC AND ANAEROBIC 5CC  Final   Culture NO GROWTH 5 DAYS  Final   Report Status 04/08/2016 FINAL  Final  Urine culture     Status: Abnormal   Collection Time: 04/03/16  8:07 PM  Result Value Ref Range Status   Specimen Description URINE, CLEAN CATCH  Final   Special Requests NONE  Final   Culture MULTIPLE SPECIES PRESENT, SUGGEST RECOLLECTION (A)  Final   Report Status 04/05/2016 FINAL  Final  Surgical pcr screen     Status: None   Collection Time: 04/08/16  5:03 AM  Result Value Ref Range Status   MRSA, PCR NEGATIVE NEGATIVE Final   Staphylococcus aureus NEGATIVE NEGATIVE Final    Comment:        The Xpert SA Assay (FDA approved for NASAL specimens in patients over 65 years of age), is one component of a comprehensive surveillance program.  Test performance has been validated by [facility] for patients greater than or equal to 35 year old. It is not intended to diagnose infection nor to guide or  monitor treatment.   Anaerobic culture     Status: None (Preliminary result)  Collection Time: 04/08/16 11:56 AM  Result Value Ref Range Status   Specimen Description FLUID LEFT PLEURAL  Final   Special Requests PATIENT ON FOLLOWING  ZOSYN AND VANCOMYCIN  Final   Culture   Final    NO ANAEROBES ISOLATED; CULTURE IN PROGRESS FOR 5 DAYS   Report Status PENDING  Incomplete  Body fluid culture     Status: None   Collection Time: 04/08/16 11:56 AM  Result Value Ref Range Status   Specimen Description FLUID LEFT PLEURAL  Final   Special Requests PATIENT ON FOLLOWING  ZOSYN AND VANCOMYCIN  Final   Gram Stain   Final    RARE WBC PRESENT, PREDOMINANTLY PMN NO ORGANISMS SEEN    Culture   Final    RARE STAPHYLOCOCCUS SPECIES (COAGULASE NEGATIVE) CRITICAL RESULT CALLED TO, READ BACK BY AND VERIFIED WITH: J JOYCE,RN AT 1635 04/11/16 BY L BENFIELD    Report Status 04/12/2016 FINAL  Final   Organism ID, Bacteria STAPHYLOCOCCUS SPECIES (COAGULASE NEGATIVE)  Final      Susceptibility   Staphylococcus species (coagulase negative) - MIC*    CIPROFLOXACIN <=0.5 SENSITIVE Sensitive     ERYTHROMYCIN <=0.25 SENSITIVE Sensitive     GENTAMICIN <=0.5 SENSITIVE Sensitive     OXACILLIN 0.5 SENSITIVE Sensitive     TETRACYCLINE <=1 SENSITIVE Sensitive     VANCOMYCIN <=0.5 SENSITIVE Sensitive     TRIMETH/SULFA <=10 SENSITIVE Sensitive     CLINDAMYCIN <=0.25 SENSITIVE Sensitive     RIFAMPIN <=0.5 SENSITIVE Sensitive     Inducible Clindamycin NEGATIVE Sensitive     * RARE STAPHYLOCOCCUS SPECIES (COAGULASE NEGATIVE)      Radiology Studies: Dg Chest Port 1 View  Result Date: 04/12/2016 CLINICAL DATA:  Followup left-sided pleural effusion. EXAM: PORTABLE CHEST 1 VIEW COMPARISON:  04/10/2016 FINDINGS: The left-sided pleural drainage catheter is stable. There is a persistent small to moderate-sized left pleural effusion with overlying atelectasis. Persistent central vascular congestion. IMPRESSION:  Persistent left-sided pleural effusion and overlying atelectasis. Persistent central vascular congestion Electronically Signed   By: Marijo Sanes M.D.   On: 04/12/2016 09:13     Scheduled Meds: . [START ON 05/01/2016] cyanocobalamin  1,000 mcg Intramuscular Q30 days  . dextromethorphan  30 mg Oral QHS  . doxazosin  4 mg Oral QHS  . eltrombopag  75 mg Oral QAC lunch  . ferrous sulfate  325 mg Oral QPC supper  . fluticasone  1 spray Each Nare QHS  . guaiFENesin  600 mg Oral BID  . insulin aspart  0-15 Units Subcutaneous TID WC  . insulin glargine  15 Units Subcutaneous Daily  . metoprolol succinate  100 mg Oral Daily  . multivitamin with minerals  1 tablet Oral QHS  . piperacillin-tazobactam (ZOSYN)  IV  3.375 g Intravenous Q8H  . sodium chloride flush  3 mL Intravenous Q12H   Continuous Infusions:     Marzetta Board, MD, PhD [facility] Pager [phone]  If 7PM-7AM, please contact night-coverage [email or web address] Password [email or web address] 04/12/2016, 10:18 AM

## 2016-04-12 NOTE — Progress Notes (Addendum)
      [address]       [place] [address]             [phone]      4 Days Post-Op Procedure(s) (LRB): REMOVAL OF PLEURAL DRAINAGE CATHETER (Left) CHEST TUBE INSERTION (Left)   Subjective:  Mr. Spradley states he isn't doing well today.  He says his head, stomach and back hurts  Objective: Vital signs in last 24 hours: Temp:  [98.5 F (36.9 C)-99.3 F (37.4 C)] 98.5 F (36.9 C) (10/23 0413) Pulse Rate:  [66-99] 99 (10/23 0413) Cardiac Rhythm: Sinus tachycardia (10/23 0700) Resp:  [20] 20 (10/23 0413) BP: (131-146)/(58-65) 131/62 (10/23 0413) SpO2:  [96 %-99 %] 99 % (10/23 0413)  Intake/Output from previous day: 10/22 0701 - 10/23 0700 In: -  Out: 430 [Urine:400; Chest Tube:30]  General appearance: alert, cooperative and no distress Heart: regular rate and rhythm Lungs: diminished breath sounds bibasilar Abdomen: soft, non-tender; bowel sounds normal; no masses,  no organomegaly Wound: clean and dry  Lab Results:  Recent Labs  04/11/16 0249 04/12/16 0215  WBC 17.3* 16.6*  HGB 7.7* 7.8*  HCT 26.3* 27.6*  PLT 99* 113*   BMET:  Recent Labs  04/11/16 0249 04/12/16 0215  NA 140 141  K 3.5 3.9  CL 106 105  CO2 26 29  GLUCOSE 94 90  BUN 19 21*  CREATININE 2.10* 2.16*  CALCIUM 7.8* 8.0*    PT/INR: No results for input(s): LABPROT, INR in the last 72 hours. ABG No results found for: PHART, HCO3, TCO2, ACIDBASEDEF, O2SAT CBG (last 3)   Recent Labs  04/11/16 2036 04/12/16 0424 04/12/16 0646  GLUCAP 118* 90 80    Assessment/Plan: S/P Procedure(s) (LRB): REMOVAL OF PLEURAL DRAINAGE CATHETER (Left) CHEST TUBE INSERTION (Left)  1. Pulm- chest tube removed yesterday- pigtail catheter still draining, 112 cc output yesterday- leave pigtail until dry 2. ID- low grade fever overnight, OR cultures are positive for CNS, susceptibilities pending, leukocytosis improving 3. Renal- creatinine has been slowly increasing up to 2.16, need to  watch closely 4. CV-hemodynamically stable, on Toprol 5. Dispo- continue pigtail until dry, OR cultures positive for CNS... Await sensitivities, creatinine trending upward... Care per primary   LOS: 9 days    BARRETT, ERIN 04/12/2016 left pleural fluid culture positive for MSSA, covered with IV Zosyn  We'll leave IR pigtail catheter in until drainage stops Left basilar chest tube removed with minimal output Recommend continuing IV antibiotics as long as patient is in  [place] catheter to remain until patient is discharged Tharon Aquas trigt M.D.

## 2016-04-12 NOTE — Progress Notes (Signed)
Pharmacy Antibiotic Note  Ricardo Watkins is a 82 y.o. male admitted on 04/03/2016 with sepsis. Continues on Zosyn Day #10 per Pharmacy. Pleural fluid cx grew CONS, pan-sensitive. Scr trending up to 2.16, CrCl~27. Afebrile, wbc trending down to 16.6.  Plan: Zosyn 3.375g IV q8h extended infusion Monitor renal function closely, c/s, LOT  Height: 5\' 7"  (170.2 cm) Weight: 188 lb 9.6 oz (85.5 kg) IBW/kg (Calculated) : 66.1  Temp (24hrs), Avg:98.9 F (37.2 C), Min:98.5 F (36.9 C), Max:99.3 F (37.4 C)   Recent Labs Lab 04/08/16 0231 04/08/16 1819 04/09/16 0419 04/09/16 1828 04/10/16 0430 04/11/16 0249 04/12/16 0215  WBC 11.9*  --  25.2*  --  23.9* 17.3* 16.6*  CREATININE 1.35*  --  1.62*  --  1.90* 2.10* 2.16*  VANCOTROUGH  --  37*  --  16  --   --   --     Estimated Creatinine Clearance: 27.1 mL/min (by C-G formula based on SCr of 2.16 mg/dL (H)).    Allergies  Allergen Reactions  . Glucophage [Metformin Hydrochloride] Other (See Comments)    Chest pain  . Zetia [Ezetimibe] Other (See Comments)    weakness  . Fenofibrate Rash  . Niacin-Lovastatin Er Rash    Antimicrobials this admission:  Vanc 10/14 >> 10/22 Zosyn 10/14 >>   Microbiology results:  10/14 BCx: ngF 10/14 UCx: neg 10/19 pleural fluid: CONS (pan-S)  Elicia Lamp, PharmD, BCPS Clinical Pharmacist 04/12/2016 1:26 PM

## 2016-04-13 LAB — BASIC METABOLIC PANEL
ANION GAP: 8 (ref 5–15)
BUN: 21 mg/dL — AB (ref 6–20)
CHLORIDE: 104 mmol/L (ref 101–111)
CO2: 28 mmol/L (ref 22–32)
Calcium: 7.8 mg/dL — ABNORMAL LOW (ref 8.9–10.3)
Creatinine, Ser: 2.11 mg/dL — ABNORMAL HIGH (ref 0.61–1.24)
GFR calc Af Amer: 32 mL/min — ABNORMAL LOW (ref >60)
GFR calc non Af Amer: 27 mL/min — ABNORMAL LOW (ref >60)
GLUCOSE: 105 mg/dL — AB (ref 65–99)
POTASSIUM: 3.6 mmol/L (ref 3.5–5.1)
Sodium: 140 mmol/L (ref 135–145)

## 2016-04-13 LAB — CBC
HCT: 26.5 % — ABNORMAL LOW (ref 39.0–52.0)
Hemoglobin: 7.7 g/dL — ABNORMAL LOW (ref 13.0–17.0)
MCH: 23.4 pg — ABNORMAL LOW (ref 26.0–34.0)
MCHC: 29.1 g/dL — ABNORMAL LOW (ref 30.0–36.0)
MCV: 80.5 fL (ref 78.0–100.0)
Platelets: 96 10*3/uL — ABNORMAL LOW (ref 150–400)
RBC: 3.29 MIL/uL — ABNORMAL LOW (ref 4.22–5.81)
RDW: 23.9 % — ABNORMAL HIGH (ref 11.5–15.5)
WBC: 15.5 10*3/uL — ABNORMAL HIGH (ref 4.0–10.5)

## 2016-04-13 LAB — GLUCOSE, CAPILLARY
GLUCOSE-CAPILLARY: 129 mg/dL — AB (ref 65–99)
GLUCOSE-CAPILLARY: 183 mg/dL — AB (ref 65–99)
Glucose-Capillary: 174 mg/dL — ABNORMAL HIGH (ref 65–99)
Glucose-Capillary: 240 mg/dL — ABNORMAL HIGH (ref 65–99)

## 2016-04-13 NOTE — Progress Notes (Signed)
Physical Therapy Treatment Patient Details Name: Ricardo Watkins MRN: [id number] DOB: 09-19-1930 Today's Date: 04/13/2016    History of Present Illness 82 y.o. male with medical history significant of non-Hodgkin's lymphoma, CAD, type 2 diabetes, hyperlipidemia, hypertension, anemia, prostate CA, diverticulosis, cataracts, DVT, chronic cough, chronic left pleural effusion with insertion of left pleural catheter on 11/10/2015 by [name] who comes to the hospital due to fever, chills, worsening fatigue and malaise.  Patient s/p removal of pleural draingae catheter and insertion of chest tube Lt. on 04/08/16.    PT Comments    Pt pleasant and in chair on arrival. Pt reports being OOB grossly 2.5 hours and fatigued from sitting up but willing to ambulate and progress function. Pt with improved transfers and gait and encouraged to continue to maximize activity with assist of nursing. Encouraged HEP throughout the day as well. Will continue to follow.  HR 111 3L throughout unable to achieve pulse ox reading at this time  Follow Up Recommendations  Home health PT;Supervision/Assistance - 24 hour     Equipment Recommendations       Recommendations for Other Services       Precautions / Restrictions Precautions Precautions: Fall Precaution Comments: Chest tube left    Mobility  Bed Mobility   Bed Mobility: Sit to Supine       Sit to supine: Min guard   General bed mobility comments: cues for sequence and line management with pt able to return to supine with use of bed rail   Transfers Overall transfer level: Needs assistance   Transfers: Sit to/from Stand Sit to Stand: Min guard         General transfer comment: cues for hand placement  Ambulation/Gait Ambulation/Gait assistance: Min guard Ambulation Distance (Feet): 250 Feet Assistive device: Rolling walker (2 wheeled) Gait Pattern/deviations: Step-through pattern;Decreased stride length;Trunk flexed   Gait  velocity interpretation: Below normal speed for age/gender General Gait Details: cues for posture, position in RW and breathing technique   Stairs            Wheelchair Mobility    Modified Rankin (Stroke Patients Only)       Balance Overall balance assessment: Needs assistance   Sitting balance-Leahy Scale: Good       Standing balance-Leahy Scale: Poor                      Cognition Arousal/Alertness: Awake/alert Behavior During Therapy: Flat affect Overall Cognitive Status: Within Functional Limits for tasks assessed                      Exercises General Exercises - Lower Extremity Long Arc Quad: AROM;Both;Seated;15 reps Hip Flexion/Marching: AROM;Both;Seated;15 reps    General Comments        Pertinent Vitals/Pain Pain Score: 5  Pain Location: left chest at CT site Pain Descriptors / Indicators: Sore Pain Intervention(s): Limited activity within patient's tolerance;Repositioned    Home Living                      Prior Function            PT Goals (current goals can now be found in the care plan section) Progress towards PT goals: Progressing toward goals    Frequency           PT Plan Current plan remains appropriate    Co-evaluation  End of Session Equipment Utilized During Treatment: Gait belt;Oxygen Activity Tolerance: Patient tolerated treatment well Patient left: in bed;with call bell/phone within reach;with family/visitor present     Time: 1326-1350 PT Time Calculation (min) (ACUTE ONLY): 24 min  Charges:  $Gait Training: 8-22 mins $Therapeutic Exercise: 8-22 mins                    G Codes:      Melford Aase 2016-04-29, 2:07 PM  Elwyn Reach, [place]

## 2016-04-13 NOTE — Progress Notes (Signed)
PROGRESS NOTE  Ricardo Watkins WJX:[id number] DOB: 1931-02-10 DOA: 04/03/2016 PCP: [name], MD   LOS: 10 days   Brief Narrative: 82 y.o.malewith medical history significant of non-Hodgkin's lymphoma, CAD, type 2 diabetes, hyperlipidemia, hypertension, anemia, prostate CA, diverticulosis, cataracts, DVT, chronic cough, chronic left pleural effusion with insertion of left pleural catheter on 11/10/2015 by [name] who comes to the hospital due to fever, chills, worsening fatigue and malaise. Per patient, his daughter and wife, his chest tube drainage has been decreasing his output for the pastseveral days and he stopped completely the day PTA. He normally has up to 500 mL of drainage every other day. Patient was worked up and found to be septic and have a loculated effusion. TCV following patient, he went to OR on 10/19 and had the non functioning catheter removed and placed a new pigtail. Patient had a second chest tube placed in an adjacent collection on 10/20 which was removed on 10/22, currently with only one chest tube in place with minimal drainage. Initial drainage cultures positive for coag negative staph sensitive to penicillins, on Zosyn.  Assessment & Plan: Principal Problem:   Empyema ([place]) Active Problems:   CAD (coronary artery disease)   History of lymphoma   Essential hypertension   Thrombocytopenia (HCC)   Microcytic anemia   Type 2 diabetes mellitus (HCC)   Empyema lung (HCC)   Sepsis due to loculated Effusion/Empyema from Left Obstructed Pleural Catheter for recurrent and chronic Non Malignant Pleural Effusions with hypoxic respiratory failure - CT Scan on admission showed Moderate-sized loculated left pleural effusion collection with a PleurX drainage catheter in place. Patient was placed on broad-spectrum antibiotics of vancomycin and Zosyn and thoracic surgery was consulted.  - Sepsis physiology resolved; Old left PleurX catheter removed by TCV 10/19.  Vancomycin was discontinued on 10/22, MRSA PCR negative. - Cardiothoracic Surgery evaluated and will continue to follow; IR placed CT Guided Fluid Drain by PigTail Catheter 10/16 and drained ~550 mL. Patient taken to OR 10/19 with removal of old catheter and placement of a left sided chest tube  - post op had significant leukocytosis, up to 25 >> 23.9 >>17.3>> 15.5,  - removed one of the chest tubes 10/22 per TCV - Fluid collected on 10/19 during surgery grew coagulase-negative staph which is pansensitive. Continue IV Zosyn for now, when he gets closer to discharge he can be switched to by mouth antibiotics Hx of Non-Hodgkins Lymphoma - PET-CT Scan showed Enlarging hypermetabolic mediastinal lymph nodes suggesting recurrent lymphoma. - Patient was seen by Hematologist/Oncologist [name], will have close outpatient follow up  - Continue scheduled follow-ups with Hematology/Oncology after empyema treatment. CKD III - baseline Cr 1.3 - 1.7, closely monitor, his Cr has been slowly increasing since his surgery few days ago. He has good UOP per RN, is incontinent at times and cannot quantify.  - With increase in creatinine and his vancomycin was discontinued and his Avapro was also stopped. Creatinine is now improving. Continue to monitor. Abdominal pain - patient withintermittent crampy abdominal pain, states that he occasionally gets these at home.  - plain films normal - This is improving  MDS (myelodysplastic syndrome) with 5q deletion - Followed by Hematology/Oncology; Seen by [name] today - Continue Promacta. Patient's Hematologist increased it to 75 mg and will reassess next visit. Hypokalemia - Improved, replete prn Insulin Dependent Type 2 Diabetes Mellitus  - Continue Lantus and SSI, CBGs controlled 100-130s CAD  - Continue Metoprolol 100 mg by mouth daily  and his Irbesartan was held due to renal function  - Clopidogrel was helped by Hematology/Oncology because of  Thrombocytopenia. Essential Hypertension - Continue Metoprolol 100 mg po daily - Monitor blood pressure, BUN/creatinine and electrolytes. Stable today  Thrombocytopenia  - Platelet Count stable now, increasing  - SCDs for DVT prophylaxis. - Continue Promacta.  Patient's Hematologist increased it to 75 mg and will reassess next visit. - Hematology recommended stopping Clopidogrel; P2Y12 from 10/15 WNL Microcytic Anemia - C/w Ferrous Sulfate 325 mg tablet po  - monitor, Hb stable, no s/s of bleeding. Transfuse for Hb < 7   DVT prophylaxis: SCD Code Status: Full Family Communication: no family bedside Disposition Plan: home when ready  Consultants:   Cardiothoracic surgery  IR  HemOnc  Procedures:   CT Guided IR Pigtail Cathether Drain  Antimicrobials:  Vancomycin 10/14 >> 10/22  Zosyn 10/14 >>  Subjective: - feels overall a bit better. Has less abdominal pain today   Objective: Vitals:   04/12/16 1019 04/12/16 1300 04/12/16 2158 04/13/16 0430  BP: (!) 147/49 111/61 130/63 (!) 122/57  Pulse: (!) 108 89 100 (!) 101  Resp:  17 18 20   Temp:  98.1 F (36.7 C) 98.5 F (36.9 C) 97.7 F (36.5 C)  TempSrc:  Oral Oral Oral  SpO2:  97% 97% 96%  Weight:      Height:        Intake/Output Summary (Last 24 hours) at 04/13/16 0938 Last data filed at 04/13/16 0656  Gross per 24 hour  Intake              610 ml  Output              660 ml  Net              -50 ml   Filed Weights   04/03/16 1946 04/03/16 2026 04/04/16 1000  Weight: 85.3 kg (188 lb) 85.3 kg (188 lb) 85.5 kg (188 lb 9.6 oz)    Examination: Constitutional: NAD Vitals:   04/12/16 1019 04/12/16 1300 04/12/16 2158 04/13/16 0430  BP: (!) 147/49 111/61 130/63 (!) 122/57  Pulse: (!) 108 89 100 (!) 101  Resp:  17 18 20   Temp:  98.1 F (36.7 C) 98.5 F (36.9 C) 97.7 F (36.5 C)  TempSrc:  Oral Oral Oral  SpO2:  97% 97% 96%  Weight:      Height:       Eyes: PERRL, lids and conjunctivae normal ENMT:  Mucous membranes are moist. Respiratory: decreased breath sounds at the bases, no wheezing, no crackles. Pigtail catheter / chest tube in place Cardiovascular: Regular rate and rhythm, 3/6 SEM. No LE edema Abdomen: no tenderness. Bowel sounds positive.  Musculoskeletal: no clubbing / cyanosis.  Neurologic: non focal   Data Reviewed: I have personally reviewed following labs and imaging studies  CBC:  Recent Labs Lab 04/07/16 0227 04/08/16 0231 04/09/16 0419 04/10/16 0430 04/11/16 0249 04/12/16 0215 04/13/16 0224  WBC 11.0* 11.9* 25.2* 23.9* 17.3* 16.6* 15.5*  NEUTROABS 7.3 8.6*  --  18.4*  --   --   --   HGB 8.8* 8.5* 8.2* 7.6* 7.7* 7.8* 7.7*  HCT 30.4* 28.9* 28.6* 26.1* 26.3* 27.6* 26.5*  MCV 80.0 79.4 80.6 79.6 79.5 81.2 80.5  PLT 76* 74* 75* 85* 99* 113* 96*   Basic Metabolic Panel:  Recent Labs Lab 04/07/16 0227  04/09/16 0419 04/10/16 0430 04/11/16 0249 04/12/16 0215 04/13/16 0224  NA 137  < > 139  138 140 141 140  K 3.3*  < > 3.5 3.5 3.5 3.9 3.6  CL 104  < > 106 105 106 105 104  CO2 24  < > 27 27 26 29 28   GLUCOSE 109*  < > 111* 124* 94 90 105*  BUN 7  < > 9 14 19  21* 21*  CREATININE 1.34*  < > 1.62* 1.90* 2.10* 2.16* 2.11*  CALCIUM 8.0*  < > 7.9* 7.7* 7.8* 8.0* 7.8*  MG 1.9  --   --   --   --   --   --   PHOS 2.6  --   --   --   --   --   --   < > = values in this interval not displayed. GFR: Estimated Creatinine Clearance: 27.7 mL/min (by C-G formula based on SCr of 2.11 mg/dL (H)). Liver Function Tests:  Recent Labs Lab 04/09/16 0419 04/10/16 0430  AST 17 14*  ALT 14* 13*  ALKPHOS 62 59  BILITOT 0.4 0.4  PROT 6.7 6.4*  ALBUMIN 1.8* 1.6*   No results for input(s): LIPASE, AMYLASE in the last 168 hours. No results for input(s): AMMONIA in the last 168 hours. Coagulation Profile: No results for input(s): INR, PROTIME in the last 168 hours. Cardiac Enzymes: No results for input(s): CKTOTAL, CKMB, CKMBINDEX, TROPONINI in the last 168 hours. BNP  (last 3 results)  Recent Labs  01/27/16 1119  PROBNP 311.0*   HbA1C: No results for input(s): HGBA1C in the last 72 hours. CBG:  Recent Labs Lab 04/12/16 0646 04/12/16 1119 04/12/16 1654 04/12/16 2151 04/13/16 0614  GLUCAP 80 232* 202* 138* 129*   Lipid Profile: No results for input(s): CHOL, HDL, LDLCALC, TRIG, CHOLHDL, LDLDIRECT in the last 72 hours. Thyroid Function Tests: No results for input(s): TSH, T4TOTAL, FREET4, T3FREE, THYROIDAB in the last 72 hours. Anemia Panel: No results for input(s): VITAMINB12, FOLATE, FERRITIN, TIBC, IRON, RETICCTPCT in the last 72 hours. Urine analysis:    Component Value Date/Time   COLORURINE AMBER (A) 04/03/2016 2007   APPEARANCEUR CLOUDY (A) 04/03/2016 2007   LABSPEC 1.022 04/03/2016 2007   PHURINE 5.0 04/03/2016 2007   GLUCOSEU NEGATIVE 04/03/2016 2007   HGBUR SMALL (A) 04/03/2016 2007   BILIRUBINUR NEGATIVE 04/03/2016 2007   [place] NEGATIVE 04/03/2016 2007   PROTEINUR 100 (A) 04/03/2016 2007   NITRITE NEGATIVE 04/03/2016 2007   LEUKOCYTESUR NEGATIVE 04/03/2016 2007   Sepsis Labs: Invalid input(s): PROCALCITONIN, LACTICIDVEN  Recent Results (from the past 240 hour(s))  Blood Culture (routine x 2)     Status: None   Collection Time: 04/03/16  7:01 PM  Result Value Ref Range Status   Specimen Description LEFT ANTECUBITAL  Final   Special Requests BOTTLES DRAWN AEROBIC AND ANAEROBIC 5CC  Final   Culture NO GROWTH 5 DAYS  Final   Report Status 04/08/2016 FINAL  Final  Blood Culture (routine x 2)     Status: None   Collection Time: 04/03/16  7:06 PM  Result Value Ref Range Status   Specimen Description RIGHT ANTECUBITAL  Final   Special Requests BOTTLES DRAWN AEROBIC AND ANAEROBIC 5CC  Final   Culture NO GROWTH 5 DAYS  Final   Report Status 04/08/2016 FINAL  Final  Urine culture     Status: Abnormal   Collection Time: 04/03/16  8:07 PM  Result Value Ref Range Status   Specimen Description URINE, CLEAN CATCH  Final    Special Requests NONE  Final   Culture  MULTIPLE SPECIES PRESENT, SUGGEST RECOLLECTION (A)  Final   Report Status 04/05/2016 FINAL  Final  Surgical pcr screen     Status: None   Collection Time: 04/08/16  5:03 AM  Result Value Ref Range Status   MRSA, PCR NEGATIVE NEGATIVE Final   Staphylococcus aureus NEGATIVE NEGATIVE Final    Comment:        The Xpert SA Assay (FDA approved for NASAL specimens in patients over 37 years of age), is one component of a comprehensive surveillance program.  Test performance has been validated by [facility] for patients greater than or equal to 34 year old. It is not intended to diagnose infection nor to guide or monitor treatment.   Anaerobic culture     Status: None   Collection Time: 04/08/16 11:56 AM  Result Value Ref Range Status   Specimen Description FLUID LEFT PLEURAL  Final   Special Requests PATIENT ON FOLLOWING  ZOSYN AND VANCOMYCIN  Final   Culture NO ANAEROBES ISOLATED  Final   Report Status 04/12/2016 FINAL  Final  Body fluid culture     Status: None   Collection Time: 04/08/16 11:56 AM  Result Value Ref Range Status   Specimen Description FLUID LEFT PLEURAL  Final   Special Requests PATIENT ON FOLLOWING  ZOSYN AND VANCOMYCIN  Final   Gram Stain   Final    RARE WBC PRESENT, PREDOMINANTLY PMN NO ORGANISMS SEEN    Culture   Final    RARE STAPHYLOCOCCUS SPECIES (COAGULASE NEGATIVE) CRITICAL RESULT CALLED TO, READ BACK BY AND VERIFIED WITH: J JOYCE,RN AT 1635 04/11/16 BY L BENFIELD    Report Status 04/12/2016 FINAL  Final   Organism ID, Bacteria STAPHYLOCOCCUS SPECIES (COAGULASE NEGATIVE)  Final      Susceptibility   Staphylococcus species (coagulase negative) - MIC*    CIPROFLOXACIN <=0.5 SENSITIVE Sensitive     ERYTHROMYCIN <=0.25 SENSITIVE Sensitive     GENTAMICIN <=0.5 SENSITIVE Sensitive     OXACILLIN 0.5 SENSITIVE Sensitive     TETRACYCLINE <=1 SENSITIVE Sensitive     VANCOMYCIN <=0.5 SENSITIVE Sensitive      TRIMETH/SULFA <=10 SENSITIVE Sensitive     CLINDAMYCIN <=0.25 SENSITIVE Sensitive     RIFAMPIN <=0.5 SENSITIVE Sensitive     Inducible Clindamycin NEGATIVE Sensitive     * RARE STAPHYLOCOCCUS SPECIES (COAGULASE NEGATIVE)      Radiology Studies: Dg Chest Port 1 View  Result Date: 04/12/2016 CLINICAL DATA:  Followup left-sided pleural effusion. EXAM: PORTABLE CHEST 1 VIEW COMPARISON:  04/10/2016 FINDINGS: The left-sided pleural drainage catheter is stable. There is a persistent small to moderate-sized left pleural effusion with overlying atelectasis. Persistent central vascular congestion. IMPRESSION: Persistent left-sided pleural effusion and overlying atelectasis. Persistent central vascular congestion Electronically Signed   By: Marijo Sanes M.D.   On: 04/12/2016 09:13     Scheduled Meds: . [START ON 05/01/2016] cyanocobalamin  1,000 mcg Intramuscular Q30 days  . dextromethorphan  30 mg Oral QHS  . doxazosin  4 mg Oral QHS  . eltrombopag  75 mg Oral QAC lunch  . ferrous sulfate  325 mg Oral QPC supper  . fluticasone  1 spray Each Nare QHS  . guaiFENesin  600 mg Oral BID  . insulin aspart  0-15 Units Subcutaneous TID WC  . insulin glargine  15 Units Subcutaneous Daily  . metoprolol succinate  100 mg Oral Daily  . multivitamin with minerals  1 tablet Oral QHS  . piperacillin-tazobactam (ZOSYN)  IV  3.375 g  Intravenous Q8H  . sodium chloride flush  3 mL Intravenous Q12H   Continuous Infusions:     Marzetta Board, MD, PhD [facility] Pager [phone]  If 7PM-7AM, please contact night-coverage [email or web address] Password [facility] 04/13/2016, 9:38 AM

## 2016-04-13 NOTE — Progress Notes (Addendum)
[address]       [place] [address]             [phone]      5 Days Post-Op Procedure(s) (LRB): REMOVAL OF PLEURAL DRAINAGE CATHETER (Left) CHEST TUBE INSERTION (Left) Subjective: Feeling better, minor abdominal discomfort, + BM. Ambulation improving. Not SOB  Objective: Vital signs in last 24 hours: Temp:  [97.7 F (36.5 C)-98.5 F (36.9 C)] 97.7 F (36.5 C) (10/24 0430) Pulse Rate:  [89-108] 101 (10/24 0430) Cardiac Rhythm: Sinus tachycardia (10/23 1900) Resp:  [17-20] 20 (10/24 0430) BP: (111-147)/(49-63) 122/57 (10/24 0430) SpO2:  [96 %-97 %] 96 % (10/24 0430)  Hemodynamic parameters for last 24 hours:    Intake/Output from previous day: 10/23 0701 - 10/24 0700 In: 850 [P.O.:850] Out: 661 [Urine:650; Stool:1; Chest Tube:10] Intake/Output this shift: No intake/output data recorded.  General appearance: alert, cooperative and no distress Heart: regular rate and rhythm Lungs: dim left lower fields Abdomen: soft, + BS, minor TTP LLQ, no guarding/rebound Extremities: minor edema Wound: dressings intact  Lab Results:  Recent Labs  04/12/16 0215 04/13/16 0224  WBC 16.6* 15.5*  HGB 7.8* 7.7*  HCT 27.6* 26.5*  PLT 113* 96*   BMET:  Recent Labs  04/12/16 0215 04/13/16 0224  NA 141 140  K 3.9 3.6  CL 105 104  CO2 29 28  GLUCOSE 90 105*  BUN 21* 21*  CREATININE 2.16* 2.11*  CALCIUM 8.0* 7.8*    PT/INR: No results for input(s): LABPROT, INR in the last 72 hours. ABG No results found for: PHART, HCO3, TCO2, ACIDBASEDEF, O2SAT CBG (last 3)   Recent Labs  04/12/16 1654 04/12/16 2151 04/13/16 0614  GLUCAP 202* 138* 129*    Meds Scheduled Meds: . [START ON 05/01/2016] cyanocobalamin  1,000 mcg Intramuscular Q30 days  . dextromethorphan  30 mg Oral QHS  . doxazosin  4 mg Oral QHS  . eltrombopag  75 mg Oral QAC lunch  . ferrous sulfate  325 mg Oral QPC supper  . fluticasone  1 spray Each Nare QHS  . guaiFENesin  600  mg Oral BID  . insulin aspart  0-15 Units Subcutaneous TID WC  . insulin glargine  15 Units Subcutaneous Daily  . metoprolol succinate  100 mg Oral Daily  . multivitamin with minerals  1 tablet Oral QHS  . piperacillin-tazobactam (ZOSYN)  IV  3.375 g Intravenous Q8H  . sodium chloride flush  3 mL Intravenous Q12H   Continuous Infusions:  PRN Meds:.acetaminophen, albuterol, HYDROcodone-acetaminophen, lip balm, meclizine, metoCLOPramide, morphine injection, ondansetron **OR** ondansetron (ZOFRAN) IV  Xrays Dg Chest Port 1 View  Result Date: 04/12/2016 CLINICAL DATA:  Followup left-sided pleural effusion. EXAM: PORTABLE CHEST 1 VIEW COMPARISON:  04/10/2016 FINDINGS: The left-sided pleural drainage catheter is stable. There is a persistent small to moderate-sized left pleural effusion with overlying atelectasis. Persistent central vascular congestion. IMPRESSION: Persistent left-sided pleural effusion and overlying atelectasis. Persistent central vascular congestion Electronically Signed   By: Marijo Sanes M.D.   On: 04/12/2016 09:13    Assessment/Plan: S/P Procedure(s) (LRB): REMOVAL OF PLEURAL DRAINAGE CATHETER (Left) CHEST TUBE INSERTION (Left)  1 doing well with slow/steady improvement 2 conts pigtail but no recent drainage recorded so hopefully remove soon 3 cont pulm toilet/rehab 5 leukocytosis trend improving 6 a bit more anemic- monitor closely, microcytic- on Fe++ and monthly B12(?) 7 platelets lower- monitor 8 medical management per primary   LOS: 10 days  GOLD,WAYNE E 04/13/2016  DC pigtail 10-25 if no change in next 24 hrs patient examined and medical record reviewed,agree with above note. Tharon Aquas Trigt III 04/13/2016

## 2016-04-14 ENCOUNTER — Encounter: Payer: Medicare Other | Admitting: Cardiothoracic Surgery

## 2016-04-14 ENCOUNTER — Inpatient Hospital Stay (HOSPITAL_COMMUNITY): Payer: Medicare Other

## 2016-04-14 DIAGNOSIS — J869 Pyothorax without fistula: Secondary | ICD-10-CM

## 2016-04-14 DIAGNOSIS — I10 Essential (primary) hypertension: Secondary | ICD-10-CM

## 2016-04-14 DIAGNOSIS — J9601 Acute respiratory failure with hypoxia: Secondary | ICD-10-CM

## 2016-04-14 DIAGNOSIS — J9621 Acute and chronic respiratory failure with hypoxia: Secondary | ICD-10-CM

## 2016-04-14 DIAGNOSIS — D509 Iron deficiency anemia, unspecified: Secondary | ICD-10-CM

## 2016-04-14 DIAGNOSIS — D696 Thrombocytopenia, unspecified: Secondary | ICD-10-CM

## 2016-04-14 LAB — CBC
HEMATOCRIT: 27.9 % — AB (ref 39.0–52.0)
HEMOGLOBIN: 8.1 g/dL — AB (ref 13.0–17.0)
MCH: 23.2 pg — ABNORMAL LOW (ref 26.0–34.0)
MCHC: 29 g/dL — AB (ref 30.0–36.0)
MCV: 79.9 fL (ref 78.0–100.0)
Platelets: 110 10*3/uL — ABNORMAL LOW (ref 150–400)
RBC: 3.49 MIL/uL — ABNORMAL LOW (ref 4.22–5.81)
RDW: 23.4 % — ABNORMAL HIGH (ref 11.5–15.5)
WBC: 19.8 10*3/uL — ABNORMAL HIGH (ref 4.0–10.5)

## 2016-04-14 LAB — GLUCOSE, CAPILLARY
GLUCOSE-CAPILLARY: 122 mg/dL — AB (ref 65–99)
GLUCOSE-CAPILLARY: 179 mg/dL — AB (ref 65–99)
Glucose-Capillary: 116 mg/dL — ABNORMAL HIGH (ref 65–99)
Glucose-Capillary: 145 mg/dL — ABNORMAL HIGH (ref 65–99)
Glucose-Capillary: 134 mg/dL — ABNORMAL HIGH (ref 65–99)

## 2016-04-14 LAB — BASIC METABOLIC PANEL
Anion gap: 7 (ref 5–15)
BUN: 20 mg/dL (ref 6–20)
CHLORIDE: 106 mmol/L (ref 101–111)
CO2: 29 mmol/L (ref 22–32)
CREATININE: 2.02 mg/dL — AB (ref 0.61–1.24)
Calcium: 8 mg/dL — ABNORMAL LOW (ref 8.9–10.3)
GFR calc non Af Amer: 29 mL/min — ABNORMAL LOW (ref >60)
GFR, EST AFRICAN AMERICAN: 33 mL/min — AB (ref >60)
Glucose, Bld: 116 mg/dL — ABNORMAL HIGH (ref 65–99)
Potassium: 3.7 mmol/L (ref 3.5–5.1)
Sodium: 142 mmol/L (ref 135–145)

## 2016-04-14 MED ORDER — FUROSEMIDE 10 MG/ML IJ SOLN
40.0000 mg | Freq: Once | INTRAMUSCULAR | Status: AC
  Administered 2016-04-14: 40 mg via INTRAVENOUS
  Filled 2016-04-14: qty 4

## 2016-04-14 MED ORDER — INSULIN ASPART 100 UNIT/ML ~~LOC~~ SOLN
2.0000 [IU] | Freq: Three times a day (TID) | SUBCUTANEOUS | Status: DC
  Administered 2016-04-14 – 2016-04-17 (×8): 2 [IU] via SUBCUTANEOUS

## 2016-04-14 NOTE — Care Management Important Message (Signed)
Important Message  Patient Details  Name: Ricardo Watkins MRN: [id number] Date of Birth: 06-28-1930   Medicare Important Message Given:  Yes    Hector Venne 04/14/2016, 11:12 AM

## 2016-04-14 NOTE — Progress Notes (Signed)
      [address]       [place] [address]             [phone]      6 Days Post-Op Procedure(s) (LRB): REMOVAL OF PLEURAL DRAINAGE CATHETER (Left) CHEST TUBE INSERTION (Left) Subjective: Just took a pain pill and he is sleepy but arousable.    Objective: Vital signs in last 24 hours: Temp:  [97.6 F (36.4 C)-99.1 F (37.3 C)] 97.6 F (36.4 C) (10/25 0525) Pulse Rate:  [96-111] 96 (10/25 0525) Cardiac Rhythm: Sinus tachycardia (10/24 1900) Resp:  [16-18] 16 (10/25 0525) BP: (124-152)/(53-59) 124/55 (10/25 0525) SpO2:  [94 %-99 %] 95 % (10/25 0525) FiO2 (%):  [3 %] 3 % (10/24 1404)     Intake/Output from previous day: 10/24 0701 - 10/25 0700 In: 600 [P.O.:600] Out: 650 [Urine:600; Chest Tube:50] Intake/Output this shift: No intake/output data recorded.  General appearance: cooperative and no distress Heart: regular rate and rhythm Lungs: clear to auscultation bilaterally Abdomen: soft, non-tender; bowel sounds normal; no masses,  no organomegaly Extremities: extremities normal, atraumatic, no cyanosis or edema Wound: clean and dry  Lab Results:  Recent Labs  04/13/16 0224 04/14/16 0243  WBC 15.5* 19.8*  HGB 7.7* 8.1*  HCT 26.5* 27.9*  PLT 96* 110*   BMET:  Recent Labs  04/13/16 0224 04/14/16 0243  NA 140 142  K 3.6 3.7  CL 104 106  CO2 28 29  GLUCOSE 105* 116*  BUN 21* 20  CREATININE 2.11* 2.02*  CALCIUM 7.8* 8.0*    PT/INR: No results for input(s): LABPROT, INR in the last 72 hours. ABG No results found for: PHART, HCO3, TCO2, ACIDBASEDEF, O2SAT CBG (last 3)   Recent Labs  04/13/16 2103 04/14/16 0300 04/14/16 0630  GLUCAP 183* 116* 122*    Assessment/Plan: S/P Procedure(s) (LRB): REMOVAL OF PLEURAL DRAINAGE CATHETER (Left) CHEST TUBE INSERTION (Left)  1 doing well with slow/steady improvement 2 conts pigtail. Drainage went from 79ml/24 hours to 47ml/24 hours, will discuss with attending about removal 3 cont  pulm toilet/rehab 5 leukocytosis up from 15.5 to 19.8 6 anemic- monitor closely, microcytic- on Fe++ and monthly B12(?) 7 platelets improving 8 medical management per primary   LOS: 11 days    Elgie Collard 04/14/2016

## 2016-04-14 NOTE — Progress Notes (Signed)
PROGRESS NOTE  HENSON FRATICELLI HYW:[id number] DOB: 26-Nov-1930 DOA: 04/03/2016 PCP: [name], MD  Brief Narrative: 82 y.o.malewith medical history significant of non-Hodgkin's lymphoma, CAD, type 2 diabetes, hyperlipidemia, hypertension, anemia, prostate CA, diverticulosis, cataracts, DVT, chronic cough, chronic left pleural effusion with insertion of left pleural catheter on 11/10/2015 by [name] who comes to the hospital due to fever, chills, worsening fatigue and malaise. Per patient, his daughter and wife, his chest tube drainage has been decreasing his output for the pastseveral days and he stopped completely the day PTA. He normally has up to 500 mL of drainage every other day. Patient was worked up and found to be septic and have a loculated effusion. TCV following patient, he went to OR on 10/19 and had the non functioning catheter removed and placed a new pigtail. Patient had a second chest tube placed in an adjacent collection on 10/20 which was removed on 10/22, currently with only one chest tube in place with minimal drainage. Initial drainage cultures positive for coag negative staph sensitive to penicillins, on Zosyn.  Assessment & Plan: Principal Problem:   Empyema ([place]) Active Problems:   CAD (coronary artery disease)   History of lymphoma   Essential hypertension   Thrombocytopenia (HCC)   Microcytic anemia   Type 2 diabetes mellitus (HCC)   Empyema lung (HCC)   Sepsis due to loculated Effusion/Empyema from Left Obstructed Pleural Catheter for recurrent and chronic Non Malignant Pleural Effusions with hypoxic respiratory failure - CT Scan on admission showed Moderate-sized loculated left pleural effusion collection with a PleurX drainage catheter in place. Patient was placed on broad-spectrum antibiotics of vancomycin and Zosyn and thoracic surgery was consulted.  - Sepsis physiology resolved; Old left PleurX catheter removed by TCTS 10/19.  Vancomycin was discontinued on 10/22, MRSA PCR negative. - Cardiothoracic Surgery evaluated and will continue to follow; IR placed CT Guided Fluid Drain by PigTail Catheter 10/16 and drained ~550 mL. Patient taken to OR 10/19 with removal of old catheter and placement of a left sided chest tube  - post op had significant leukocytosis, up to 25 >> 23.9 >>17.3>> 15.5>>19.9  - removed one of the chest tubes 10/22 per TCTS - Fluid collected on 10/19 during surgery grew coagulase-negative staph which is pansensitive. Continue IV Zosyn for now -plan to d/c with amox/clav  Hx of Non-Hodgkins Lymphoma - PET-CT Scan showed Enlarging hypermetabolic mediastinal lymph nodes suggesting recurrent lymphoma. - Patient was seen by Hematologist/Oncologist [name],  -will have close outpatient follow up  - Continue scheduled follow-ups with Hematology/Oncology after empyema treatment.  Acute respiratory failure with hypoxia -secondary to empyema -04/14/16 CXR--increase pulm vasc congestion -check BNP -lasix IV x 1 -am CXR -echo -daily weights -clinically fluid overloaded  Acute on chronic Renal failureCKD III - baseline Cr 1.3 - 1.7, closely monitor, his Cr has been slowly increasing since his surgery  - With increase in creatinine and his vancomycin was discontinued and his Avapro was also stopped. Creatinine is now improving. Continue to monitor.  Abdominal pain - patient withintermittent crampy abdominal pain, states that he occasionally gets these at home.  - plain films normal - improved  MDS (myelodysplastic syndrome) with 5q deletion - Followed by Hematology/Oncology; Seen by [name] today - Continue Promacta.Patient's Hematologist increased it to 75 mg and will reassess next visit.  Hypokalemia - Improved, replete prn  Insulin Dependent Type 2 Diabetes Mellitus  - Continue Lantus and SSI,  -  add novolog 2 units tiw  CAD  - Continue Metoprolol Succinate 100 mg by mouth daily  and his Irbesartan was held due to renal function  - Clopidogrel was held by Hematology/Oncology because of Thrombocytopenia.  Essential Hypertension - Continue Metoprolol succinate100 mg po daily - Monitor blood pressure, BUN/creatinine and electrolytes.  Thrombocytopenia  - Platelet Count stable now, increasing  - SCDs for DVT prophylaxis. - Continue Promacta. Patient's Hematologist increased it to 75 mg and will reassess next visit. - Hematology recommended stopping Clopidogrel; P2Y12 from 10/15 WNL  Microcytic Anemia - C/w Ferrous Sulfate 325 mg tablet po  - monitor, Hb stable, no s/s of bleeding. Transfuse for Hb < 7 -baseline Hgb ~8   DVT prophylaxis: SCD Code Status: Full Family Communication: wife and daughter updated bedside 10/25--Total time spent 80 minutes.  Greater than 50% spent face to face counseling and coordinating care.  Disposition Plan: home if WBC continue to improve and cleared by TCTS  Consultants:   Cardiothoracic surgery  IR  HemOnc  Procedures:   CT Guided IR Pigtail Cathether Drain  Antimicrobials:  Vancomycin 10/14 >> 10/22  Zosyn 10/14 >>       Subjective: Patient denies fevers, chills, headache, chest pain, dyspnea, nausea, vomiting, diarrhea, abdominal pain, dysuria, hematuria, hematochezia, and melena.   Objective: Vitals:   04/14/16 0525 04/14/16 0800 04/14/16 0900 04/14/16 1344  BP: (!) 124/55 (!) 142/61  (!) 132/57  Pulse: 96 90  82  Resp: 16  (!) 31 (!) 23  Temp: 97.6 F (36.4 C) 98.1 F (36.7 C)  98 F (36.7 C)  TempSrc: Oral Oral    SpO2: 95% 97%  98%  Weight:      Height:        Intake/Output Summary (Last 24 hours) at 04/14/16 1727 Last data filed at 04/14/16 1308  Gross per 24 hour  Intake              582 ml  Output              550 ml  Net               32 ml   Weight change:  Exam:   General:  Pt is alert, follows commands appropriately, not in acute distress  HEENT: No icterus, No  thrush, No neck mass, [place]/AT  Cardiovascular: RRR, S1/S2, no rubs, no gallops  Respiratory: Bibasilar crackles. No wheezing. Diminished breath sounds left base.  Abdomen: Soft/+BS, non tender, non distended, no guarding  Extremities: 1 + LE edema, No lymphangitis, No petechiae, No rashes, no synovitis   Data Reviewed: I have personally reviewed following labs and imaging studies Basic Metabolic Panel:  Recent Labs Lab 04/10/16 0430 04/11/16 0249 04/12/16 0215 04/13/16 0224 04/14/16 0243  NA 138 140 141 140 142  K 3.5 3.5 3.9 3.6 3.7  CL 105 106 105 104 106  CO2 27 26 29 28 29   GLUCOSE 124* 94 90 105* 116*  BUN 14 19 21* 21* 20  CREATININE 1.90* 2.10* 2.16* 2.11* 2.02*  CALCIUM 7.7* 7.8* 8.0* 7.8* 8.0*   Liver Function Tests:  Recent Labs Lab 04/09/16 0419 04/10/16 0430  AST 17 14*  ALT 14* 13*  ALKPHOS 62 59  BILITOT 0.4 0.4  PROT 6.7 6.4*  ALBUMIN 1.8* 1.6*   No results for input(s): LIPASE, AMYLASE in the last 168 hours. No results for input(s): AMMONIA in the last 168 hours. Coagulation Profile: No results for input(s): INR, PROTIME in  the last 168 hours. CBC:  Recent Labs Lab 04/08/16 0231  04/10/16 0430 04/11/16 0249 04/12/16 0215 04/13/16 0224 04/14/16 0243  WBC 11.9*  < > 23.9* 17.3* 16.6* 15.5* 19.8*  NEUTROABS 8.6*  --  18.4*  --   --   --   --   HGB 8.5*  < > 7.6* 7.7* 7.8* 7.7* 8.1*  HCT 28.9*  < > 26.1* 26.3* 27.6* 26.5* 27.9*  MCV 79.4  < > 79.6 79.5 81.2 80.5 79.9  PLT 74*  < > 85* 99* 113* 96* 110*  < > = values in this interval not displayed. Cardiac Enzymes: No results for input(s): CKTOTAL, CKMB, CKMBINDEX, TROPONINI in the last 168 hours. BNP: Invalid input(s): POCBNP CBG:  Recent Labs Lab 04/13/16 2103 04/14/16 0300 04/14/16 0630 04/14/16 1120 04/14/16 1633  GLUCAP 183* 116* 122* 179* 145*   HbA1C: No results for input(s): HGBA1C in the last 72 hours. Urine analysis:    Component Value Date/Time   COLORURINE AMBER  (A) 04/03/2016 2007   APPEARANCEUR CLOUDY (A) 04/03/2016 2007   LABSPEC 1.022 04/03/2016 2007   PHURINE 5.0 04/03/2016 2007   GLUCOSEU NEGATIVE 04/03/2016 2007   HGBUR SMALL (A) 04/03/2016 2007   BILIRUBINUR NEGATIVE 04/03/2016 2007   [place] NEGATIVE 04/03/2016 2007   PROTEINUR 100 (A) 04/03/2016 2007   NITRITE NEGATIVE 04/03/2016 2007   LEUKOCYTESUR NEGATIVE 04/03/2016 2007   Sepsis Labs: @LABRCNTIP (procalcitonin:4,lacticidven:4) ) Recent Results (from the past 240 hour(s))  Surgical pcr screen     Status: None   Collection Time: 04/08/16  5:03 AM  Result Value Ref Range Status   MRSA, PCR NEGATIVE NEGATIVE Final   Staphylococcus aureus NEGATIVE NEGATIVE Final    Comment:        The Xpert SA Assay (FDA approved for NASAL specimens in patients over 40 years of age), is one component of a comprehensive surveillance program.  Test performance has been validated by [facility] for patients greater than or equal to 66 year old. It is not intended to diagnose infection nor to guide or monitor treatment.   Anaerobic culture     Status: None   Collection Time: 04/08/16 11:56 AM  Result Value Ref Range Status   Specimen Description FLUID LEFT PLEURAL  Final   Special Requests PATIENT ON FOLLOWING  ZOSYN AND VANCOMYCIN  Final   Culture NO ANAEROBES ISOLATED  Final   Report Status 04/12/2016 FINAL  Final  Body fluid culture     Status: None   Collection Time: 04/08/16 11:56 AM  Result Value Ref Range Status   Specimen Description FLUID LEFT PLEURAL  Final   Special Requests PATIENT ON FOLLOWING  ZOSYN AND VANCOMYCIN  Final   Gram Stain   Final    RARE WBC PRESENT, PREDOMINANTLY PMN NO ORGANISMS SEEN    Culture   Final    RARE STAPHYLOCOCCUS SPECIES (COAGULASE NEGATIVE) CRITICAL RESULT CALLED TO, READ BACK BY AND VERIFIED WITH: J JOYCE,RN AT 1635 04/11/16 BY L BENFIELD    Report Status 04/12/2016 FINAL  Final   Organism ID, Bacteria STAPHYLOCOCCUS SPECIES (COAGULASE  NEGATIVE)  Final      Susceptibility   Staphylococcus species (coagulase negative) - MIC*    CIPROFLOXACIN <=0.5 SENSITIVE Sensitive     ERYTHROMYCIN <=0.25 SENSITIVE Sensitive     GENTAMICIN <=0.5 SENSITIVE Sensitive     OXACILLIN 0.5 SENSITIVE Sensitive     TETRACYCLINE <=1 SENSITIVE Sensitive     VANCOMYCIN <=0.5 SENSITIVE Sensitive  TRIMETH/SULFA <=10 SENSITIVE Sensitive     CLINDAMYCIN <=0.25 SENSITIVE Sensitive     RIFAMPIN <=0.5 SENSITIVE Sensitive     Inducible Clindamycin NEGATIVE Sensitive     * RARE STAPHYLOCOCCUS SPECIES (COAGULASE NEGATIVE)     Scheduled Meds: . [START ON 05/01/2016] cyanocobalamin  1,000 mcg Intramuscular Q30 days  . dextromethorphan  30 mg Oral QHS  . doxazosin  4 mg Oral QHS  . eltrombopag  75 mg Oral QAC lunch  . ferrous sulfate  325 mg Oral QPC supper  . fluticasone  1 spray Each Nare QHS  . guaiFENesin  600 mg Oral BID  . insulin aspart  0-15 Units Subcutaneous TID WC  . insulin aspart  2 Units Subcutaneous TID WC  . insulin glargine  15 Units Subcutaneous Daily  . metoprolol succinate  100 mg Oral Daily  . multivitamin with minerals  1 tablet Oral QHS  . piperacillin-tazobactam (ZOSYN)  IV  3.375 g Intravenous Q8H  . sodium chloride flush  3 mL Intravenous Q12H   Continuous Infusions:   Procedures/Studies: Dg Chest 2 View  Result Date: 04/07/2016 CLINICAL DATA:  Left-sided chest tube, weakness, history of recurrent 9 malignant left pleural effusion. Also history of lymphoma EXAM: CHEST  2 VIEW COMPARISON:  Chest x-ray of 04/06/2016 and CT chest of 04/03/2016 FINDINGS: The left chest tube remains and no residual pneumothorax is seen. Apparently loculated left pleural effusion is unchanged. The pigtail of the catheter lies just superior to the superior extent of the apparently loculated effusion. Mild bibasilar atelectasis is present. Heart size is stable. IMPRESSION: 1. No pneumothorax is currently seen. 2. No change in left chest tube and  apparently loculated left pleural effusion. Electronically Signed   By: Ivar Drape M.D.   On: 04/07/2016 13:23   Dg Chest 2 View  Result Date: 04/03/2016 CLINICAL DATA:  Sepsis.  Concern for empyema EXAM: CHEST  2 VIEW COMPARISON:  03/03/2016 FINDINGS: Loculated left pleural effusion, moderate size, thickest at the base. There is a tunneled pleural catheter at the left base with alignment that is stable from 04/02/2016. The underlying lung is partly atelectatic. Normal heart size. The lower aorta is obscured by a medial pleural disease. IMPRESSION: Moderate loculated left pleural effusion with tunneled pleural catheter. Appearance is stable compared to 04/02/2016 PET-CT. Electronically Signed   By: Monte Fantasia M.D.   On: 04/03/2016 20:58   Ct Chest W Contrast  Result Date: 04/03/2016 CLINICAL DATA:  82 year old male with fever. Patient with left pleural drainage catheter. History of lymphoma. EXAM: CT CHEST WITH CONTRAST TECHNIQUE: Multidetector CT imaging of the chest was performed during intravenous contrast administration. CONTRAST:  50mL ISOVUE-300 IOPAMIDOL (ISOVUE-300) INJECTION 61% COMPARISON:  04/02/2016 PET-CT and prior studies. FINDINGS: Cardiovascular: Upper limits normal heart size and moderate coronary artery calcifications noted. There is no evidence of thoracic aortic aneurysm. Mediastinum/Nodes: Multiple shotty and enlarged mediastinal lymph nodes are identified, the largest in the lower thoracic para-aortic region. These mediastinal lymph nodes for mildly hypermetabolic on recent PET study suspicious for recurrent lymphoma. There is no evidence of pericardial effusion. Lungs/Pleura: A moderate loculated left pleural effusion is again identified. A left pleural catheter with tip in the posterior-inferior left pleural space again noted. Left lower lung atelectasis identified. Mild interlobular septal thickening is noted within both lungs. No discrete mass identified. There is no  evidence of pneumothorax or right pleural effusion. Upper Abdomen: No acute abnormality. Upper limits normal spleen size noted. Musculoskeletal: No acute or suspicious abnormality.  Degenerative disc disease in the lower cervical spine identified. IMPRESSION: Moderate loculated left pleural effusion, with left pleural catheter tip in the posterior inferior left pleural space. Associated left lower lung atelectasis. Mediastinal lymph nodes as described, mildly hypermetabolic on recent PET study suspicious for recurrent lymphoma. Coronary artery disease. Electronically Signed   By: Margarette Canada M.D.   On: 04/03/2016 21:47   Nm Pet Image Restag (ps) Skull Base To Thigh  Result Date: 04/02/2016 CLINICAL DATA:  Subsequent treatment strategy for lymphoma. EXAM: NUCLEAR MEDICINE PET SKULL BASE TO THIGH TECHNIQUE: 12.4 mCi F-18 FDG was injected intravenously. Full-ring PET imaging was performed from the skull base to thigh after the radiotracer. CT data was obtained and used for attenuation correction and anatomic localization. FASTING BLOOD GLUCOSE:  Value: 119 mg/dl COMPARISON:  10/30/2014 FINDINGS: NECK No hypermetabolic lymph nodes in the neck. CHEST There is a moderate-sized left loculated pleural effusion. No hypermetabolism to suggest a malignant effusion. It measures as simple fluid. New line small nodules are noted along the left major fissure. These are likely lymph nodes. Is also 3 subpleural nodules in the left upper on image number 104. No obvious hypermetabolism. SUV max is 1.7. These are suspicious for lymphomatous nodules however and they are new since the prior study. Enlarging mediastinal lymph nodes are mildly hypermetabolic and suspicious for recurrent lymphoma. 5 mm pretracheal lymph node on image number 87 has an SUV max of 2.56. Multiple small prevascular lymph nodes. The largest node measures 5 mm on image number 100 and SUV max is 2.46. 11 mm aorticopulmonary window lymph node on image number 94  has an SUV max of 3.5. Subcarinal lymph node on image number 109 measures 10 mm and has an SUV max of 3.5. Left-sided retrocrural lymph node measures 23 x 16 mm on image number 127 and has SUV max of 5.9. Right-sided periaortic lymph node on image number 121 measures 8.5 mm and SUV max is 4.1. ABDOMEN/PELVIS No abnormal hypermetabolic activity within the liver, pancreas, adrenal glands, or spleen. No hypermetabolic lymph nodes in the abdomen or pelvis. Stable advanced atherosclerotic calcifications involving the aorta and branch vessels. The right renal artery stent is noted. Brachytherapy seeds are noted the prostate gland. There is a large right inguinal hernia containing fat. SKELETON No focal hypermetabolic activity to suggest skeletal metastasis. IMPRESSION: 1. Enlarging hypermetabolic mediastinal lymph nodes suggesting recurrent lymphoma. 2. New nodules along the left major fissure and in the left upper lobe subpleural space worrisome for lymphomatous involvement. 3. Moderate-sized loculated left pleural effusion collection with a PleurX drainage catheter in place. 4. No findings for adenopathy in the neck, abdomen or pelvis. Electronically Signed   By: Marijo Sanes M.D.   On: 04/02/2016 14:52   Dg Chest Port 1 View  Result Date: 04/14/2016 CLINICAL DATA:  Left chest tube followup EXAM: PORTABLE CHEST 1 VIEW COMPARISON:  04/12/2016 FINDINGS: Left-sided chest tube is in place. No pneumothorax identified. The left pleural effusion is not significantly changed in volume compared with previous exam. Moderate diffuse pulmonary edema is identified which appears increased in the interval. IMPRESSION: 1. Stable appearance of left chest tube. No change in volume of left effusion. 2. Increase in diffuse pulmonary edema pattern. Electronically Signed   By: Kerby Moors M.D.   On: 04/14/2016 08:52   Dg Chest Port 1 View  Result Date: 04/12/2016 CLINICAL DATA:  Followup left-sided pleural effusion. EXAM:  PORTABLE CHEST 1 VIEW COMPARISON:  04/10/2016 FINDINGS: The left-sided pleural drainage catheter  is stable. There is a persistent small to moderate-sized left pleural effusion with overlying atelectasis. Persistent central vascular congestion. IMPRESSION: Persistent left-sided pleural effusion and overlying atelectasis. Persistent central vascular congestion Electronically Signed   By: Marijo Sanes M.D.   On: 04/12/2016 09:13   Dg Chest Port 1 View  Result Date: 04/10/2016 CLINICAL DATA:  Followup for left chest tube. Left pleural effusion. EXAM: PORTABLE CHEST 1 VIEW COMPARISON:  04/09/2016 FINDINGS: Left-sided pigtail catheter is stable, projecting along the lateral left mid to upper hemi thorax. There is persistent left pleural fluid and left lung base parenchymal opacity. No new lung abnormalities. No pneumothorax. IMPRESSION: 1. No change from the previous day's study. 2. Stable left chest tube.  No pneumothorax. 3. Persistent left lung base opacity with an associated pleural effusion. Electronically Signed   By: Lajean Manes M.D.   On: 04/10/2016 07:51   Dg Chest Port 1 View  Result Date: 04/09/2016 CLINICAL DATA:  Soreness in chest.  Chest tube present. EXAM: PORTABLE CHEST 1 VIEW COMPARISON:  04/08/2016.  04/03/2016. FINDINGS: Again noted is a left basilar chest tube and a small bore pigtail chest tube in the lateral upper chest. There continues to be evidence for a loculated left pleural fluid which is caudal to the pigtail catheter. Evidence for compressive atelectasis at the left lung base. Slightly increased densities in the right lower lung. Heart size is stable. Trachea is midline. Negative for pneumothorax. IMPRESSION: No change in the loculated left pleural effusion. Stable position of the left chest tubes without a pneumothorax. Electronically Signed   By: Markus Daft M.D.   On: 04/09/2016 08:56   Dg Chest Port 1 View  Result Date: 04/08/2016 CLINICAL DATA:  Shortness of breath.  EXAM: PORTABLE CHEST 1 VIEW COMPARISON:  04/08/2016.  04/07/2016. FINDINGS: PleurX catheter again noted on the left in stable position. No pneumothorax. Cardiomegaly with bilateral pulmonary interstitial prominence and left-sided pleural effusion noted consistent congestive heart failure. Similar finding noted on prior exam from earlier today. IMPRESSION: 1. Left chest tube noted in stable position. No pneumothorax. Stable left pleural effusion. 2. Persistent changes of congestive heart failure with pulmonary interstitial edema. No interim change from earlier exam today. Electronically Signed   By: Marcello Moores  Register   On: 04/08/2016 13:36   Dg Chest Portable 1 View  Result Date: 04/08/2016 CLINICAL DATA:  PleurX catheter placement. EXAM: PORTABLE CHEST 1 VIEW COMPARISON:  04/07/2016. FINDINGS: PleurX catheter on the left again noted. Small left pleural effusion again noted. No pneumothorax . Cardiomegaly with pulmonary vascular prominence and mild interstitial prominence noted. Mild component congestive heart failure cannot be excluded. IMPRESSION: 1. PleurX catheter on the left again noted. No pneumothorax. Small left pleural effusion again noted. 2. Cannot exclude mild congestive heart failure with mild interstitial edema. Electronically Signed   By: Marcello Moores  Register   On: 04/08/2016 12:31   Dg Chest Port 1 View  Addendum Date: 04/07/2016   ADDENDUM REPORT: 04/07/2016 08:27 ADDENDUM: Critical Value/emergent results were called by telephone at the time of interpretation on 04/07/2016 at 8:27 am to the patient's interventional radiologist, who verbally acknowledged these results. Electronically Signed   By: Marcello Moores  Register   On: 04/07/2016 08:27   Result Date: 04/07/2016 CLINICAL DATA:  Shortness of breath. EXAM: PORTABLE CHEST 1 VIEW COMPARISON:  CT 04/05/2016. FINDINGS: Left chest tube noted. Previously identified large left left up pleural effusion is been removed. Residual left lower pleural  effusion. Tiny left pneumothorax present. This was discussed  with the patient's interventional radiologist. Stable cardiomegaly. No acute bony abnormality. IMPRESSION: Left chest tube noted in good anatomic position. Significant resolution of left-sided pneumothorax. Residual left lower pleural effusion present. Miniscule left pneumothorax noted. Electronically Signed: By: Marcello Moores  Register On: 04/06/2016 16:30   Dg Chest Port 1 View  Result Date: 04/05/2016 CLINICAL DATA:  Preprocedural evaluation for pleural drainage catheter. EXAM: PORTABLE CHEST 1 VIEW COMPARISON:  CT 04/03/2016.  Chest x-ray 04/03/2016 . FINDINGS: Progressive left pleural effusion noted. Some degree of loculation may be present. Mild cardiomegaly with mild interstitial prominence, a component of mild congestive heart failure cannot be excluded. No pneumothorax . IMPRESSION: 1. Progressive left pleural effusion with possible loculation. 2. Cardiomegaly with mild interstitial prominence. A component of mild congestive heart failure cannot be excluded . Electronically Signed   By: Marcello Moores  Register   On: 04/05/2016 08:01   Dg Abd Portable 2v  Result Date: 04/10/2016 CLINICAL DATA:  Abdominal pain EXAM: PORTABLE ABDOMEN - 2 VIEW COMPARISON:  None. FINDINGS: Scattered large and small bowel gas is noted. A left thoracostomy catheter is noted over the base. No obstructive changes are seen. No free air is noted. Mild degenerative changes of the lumbar spine are seen. IMPRESSION: No acute abnormality noted. Electronically Signed   By: Inez Catalina M.D.   On: 04/10/2016 08:58   Ct Image Guided Fluid Drain By Catheter  Result Date: 04/05/2016 CLINICAL DATA:  Lymphoma, and left-sided empyema with residual loculated left pleural fluid remaining. Request has been made to place a percutaneous pleural drainage catheter. EXAM: CT GUIDED LEFT THORACOSTOMY TUBE PLACEMENT FOR EMPYEMA ANESTHESIA/SEDATION: 1.0 mg IV Versed 25 mcg IV Fentanyl Total  Moderate Sedation Time:  20 minutes The patient's level of consciousness and physiologic status were continuously monitored during the procedure by Radiology nursing. PROCEDURE: The procedure, risks, benefits, and alternatives were explained to the patient's wife. Questions regarding the procedure were encouraged and answered. The patient's wife understands and consents to the procedure. A time out was performed prior to initiating the procedure. The left upper chest wall was prepped with chlorhexidine in a sterile fashion, and a sterile drape was applied covering the operative field. A sterile gown and sterile gloves were used for the procedure. Local anesthesia was provided with 1% Lidocaine. CT was performed in a supine position. After localizing the most appropriate target for pleural drainage, an 18 gauge trocar needle was advanced under CT guidance into the left pleural space. Fluid was aspirated. A guidewire was then advanced through the needle and the needle removed. The percutaneous tract was dilated over the guidewire. A 12 French pigtail drainage catheter was then advanced over the wire. After the catheter was formed, additional CT images were performed. The catheter was then connected to a [place] Pleur-Evac device. COMPLICATIONS: None FINDINGS: The largest loculation of residual left pleural fluid is in the lateral superior hemithorax. Aspiration yielded clear, yellowish fluid. Thorax. After pleural drain placement, there is return of yellowish colored clear fluid. IMPRESSION: CT-guided left chest thoracostomy tube placement with placement of 12 French drain into the left pleural space. The site of loculated fluid in the superior and lateral aspect of the hemithorax was targeted for catheter placement. Electronically Signed   By: Aletta Edouard M.D.   On: 04/05/2016 15:54    Casady Voshell, DO  [facility] Pager [phone]  If 7PM-7AM, please contact night-coverage [email or web address] Password  [email or web address] 04/14/2016, 5:27 PM   LOS: 11 days

## 2016-04-15 ENCOUNTER — Inpatient Hospital Stay (HOSPITAL_COMMUNITY): Payer: Medicare Other

## 2016-04-15 DIAGNOSIS — I509 Heart failure, unspecified: Secondary | ICD-10-CM

## 2016-04-15 LAB — CBC
HEMATOCRIT: 26.6 % — AB (ref 39.0–52.0)
HEMOGLOBIN: 7.6 g/dL — AB (ref 13.0–17.0)
MCH: 23.5 pg — AB (ref 26.0–34.0)
MCHC: 28.6 g/dL — ABNORMAL LOW (ref 30.0–36.0)
MCV: 82.1 fL (ref 78.0–100.0)
Platelets: 75 10*3/uL — ABNORMAL LOW (ref 150–400)
RBC: 3.24 MIL/uL — AB (ref 4.22–5.81)
RDW: 23.8 % — ABNORMAL HIGH (ref 11.5–15.5)
WBC: 15.2 10*3/uL — ABNORMAL HIGH (ref 4.0–10.5)

## 2016-04-15 LAB — ECHOCARDIOGRAM COMPLETE
Height: 67 in
Weight: 3017.6 oz

## 2016-04-15 LAB — BASIC METABOLIC PANEL
Anion gap: 8 (ref 5–15)
BUN: 19 mg/dL (ref 6–20)
CHLORIDE: 103 mmol/L (ref 101–111)
CO2: 30 mmol/L (ref 22–32)
CREATININE: 2 mg/dL — AB (ref 0.61–1.24)
Calcium: 7.8 mg/dL — ABNORMAL LOW (ref 8.9–10.3)
GFR calc Af Amer: 34 mL/min — ABNORMAL LOW (ref >60)
GFR calc non Af Amer: 29 mL/min — ABNORMAL LOW (ref >60)
GLUCOSE: 104 mg/dL — AB (ref 65–99)
Potassium: 3.7 mmol/L (ref 3.5–5.1)
SODIUM: 141 mmol/L (ref 135–145)

## 2016-04-15 LAB — GLUCOSE, CAPILLARY
GLUCOSE-CAPILLARY: 114 mg/dL — AB (ref 65–99)
Glucose-Capillary: 94 mg/dL (ref 65–99)
Glucose-Capillary: 179 mg/dL — ABNORMAL HIGH (ref 65–99)
Glucose-Capillary: 141 mg/dL — ABNORMAL HIGH (ref 65–99)

## 2016-04-15 LAB — BRAIN NATRIURETIC PEPTIDE: B NATRIURETIC PEPTIDE 5: 509.3 pg/mL — AB (ref 0.0–100.0)

## 2016-04-15 MED ORDER — FUROSEMIDE 10 MG/ML IJ SOLN
40.0000 mg | Freq: Every day | INTRAMUSCULAR | Status: DC
  Administered 2016-04-15 – 2016-04-16 (×2): 40 mg via INTRAVENOUS
  Filled 2016-04-15 (×2): qty 4

## 2016-04-15 NOTE — Progress Notes (Signed)
Echocardiogram 2D Echocardiogram has been performed.  Ricardo Watkins 04/15/2016, 4:34 PM

## 2016-04-15 NOTE — Progress Notes (Signed)
Pharmacy Antibiotic Note  Ricardo Watkins is a 82 y.o. male admitted on 04/03/2016 with sepsis. Continues on Zosyn day #13 per Pharmacy. Pleural fluid cx grew CoNS, pan-sensitive. Scr trending up to 2.16 on 10/23, trended down to 2.00 today. Afebrile, WBC up to 19.8 on 10/25. Increased drainage from catheter. Zosyn regimen remains appropriate for renal function.  Plan:  Continue Zosyn 3.375 gm IV q8h (each over 4 hrs)  Monitor renal function, progress, antibiotic plans.  Noted plan for Augmentin at discharge.  Height: 5\' 7"  (170.2 cm) Weight: 188 lb 9.6 oz (85.5 kg) IBW/kg (Calculated) : 66.1  Temp (24hrs), Avg:98.3 F (36.8 C), Min:97.9 F (36.6 C), Max:98.6 F (37 C)   Recent Labs Lab 04/08/16 1819  04/09/16 1828 04/10/16 0430 04/11/16 0249 04/12/16 0215 04/13/16 0224 04/14/16 0243 04/15/16 0149  WBC  --   < >  --  23.9* 17.3* 16.6* 15.5* 19.8*  --   CREATININE  --   < >  --  1.90* 2.10* 2.16* 2.11* 2.02* 2.00*  VANCOTROUGH 37*  --  16  --   --   --   --   --   --   < > = values in this interval not displayed.  Estimated Creatinine Clearance: 29.3 mL/min (by C-G formula based on SCr of 2 mg/dL (H)).    Allergies  Allergen Reactions  . Glucophage [Metformin Hydrochloride] Other (See Comments)    Chest pain  . Zetia [Ezetimibe] Other (See Comments)    weakness  . Fenofibrate Rash  . Niacin-Lovastatin Er Rash    Antimicrobials this admission:  Vanc 10/14 >> 10/22 Zosyn 10/14 >>   Microbiology results:  10/14 Blood x 2 - negative 10/14 urine - multiple species 10/19 pleural fluid: CoNS (pan-S) 10/14 MRSA screen negative  Arty Baumgartner, [place] Pager: [phone] 04/15/2016 3:31 PM

## 2016-04-15 NOTE — Progress Notes (Signed)
Patient had a 12 beat run of V. Tach Patient on Lopressor . Observe

## 2016-04-15 NOTE — Progress Notes (Signed)
Patient sleeping and had 12 bts W.C.T. Then back to S.T. Low 100's w/ freq PVC and Pairs.R.N. Aware and Text page M.D. On call

## 2016-04-15 NOTE — Progress Notes (Signed)
Call return no orders giving.

## 2016-04-15 NOTE — Progress Notes (Addendum)
      [address]       [place] [address]             [phone]      7 Days Post-Op Procedure(s) (LRB): REMOVAL OF PLEURAL DRAINAGE CATHETER (Left) CHEST TUBE INSERTION (Left) Subjective: Sleepy. No issues overnight.   Objective: Vital signs in last 24 hours: Temp:  [97.9 F (36.6 C)-98.6 F (37 C)] 98.6 F (37 C) (10/26 0448) Pulse Rate:  [82-100] 100 (10/26 0448) Cardiac Rhythm: Sinus tachycardia (10/25 1900) Resp:  [20-31] 20 (10/26 0448) BP: (132-157)/(57-74) 157/74 (10/26 0448) SpO2:  [94 %-98 %] 94 % (10/26 0448)    Intake/Output from previous day: 10/25 0701 - 10/26 0700 In: 1232 [P.O.:1232] Out: 1250 [Urine:950; Chest Tube:300] Intake/Output this shift: No intake/output data recorded.  General appearance: alert, cooperative and no distress Heart: regular rate and rhythm Lungs: clear to auscultation bilaterally Abdomen: soft, non-tender; bowel sounds normal; no masses,  no organomegaly Extremities: edema 1+ non-pitting edema Wound: clean and dry  Lab Results:  Recent Labs  04/13/16 0224 04/14/16 0243  WBC 15.5* 19.8*  HGB 7.7* 8.1*  HCT 26.5* 27.9*  PLT 96* 110*   BMET:  Recent Labs  04/14/16 0243 04/15/16 0149  NA 142 141  K 3.7 3.7  CL 106 103  CO2 29 30  GLUCOSE 116* 104*  BUN 20 19  CREATININE 2.02* 2.00*  CALCIUM 8.0* 7.8*    PT/INR: No results for input(s): LABPROT, INR in the last 72 hours. ABG No results found for: PHART, HCO3, TCO2, ACIDBASEDEF, O2SAT CBG (last 3)   Recent Labs  04/14/16 1633 04/14/16 2155 04/15/16 0626  GLUCAP 145* 134* 94    Assessment/Plan: S/P Procedure(s) (LRB): REMOVAL OF PLEURAL DRAINAGE CATHETER (Left) CHEST TUBE INSERTION (Left)   1 doing well with slow/steady improvement 2 conts pigtail. Drainage went from 50ml/24 hours to 322ml/24 hours, will likely keep 3 cont pulm toilet/rehab 5 leukocytosis up from 15.5 to 19.8, no BMP today 6 anemic- monitor closely,  microcytic- on Fe++ and monthly B12(?) 7 platelets improving 8 medical management per primary   LOS: 12 days    Ricardo Watkins 04/15/2016  Pleural effusion related to cardiorenal status and is well drained with IR pigtail cath- cont current care patient examined and medical record reviewed,agree with above note. Ricardo Watkins 04/15/2016

## 2016-04-15 NOTE — Progress Notes (Signed)
Echocardiogram 2D Echocardiogram has been performed has been attempted pt was not available for testing.  Aggie Cosier 04/15/2016, 3:13 PM

## 2016-04-15 NOTE — Progress Notes (Signed)
PROGRESS NOTE  Ricardo Watkins QMV:[id number] DOB: Jan 24, 1931 DOA: 04/03/2016 PCP: [name], MD  Brief History:  82 y.o.malewith medical history significant of non-Hodgkin's lymphoma, CAD, type 2 diabetes, hyperlipidemia, hypertension, anemia, prostate CA, diverticulosis, cataracts, DVT, chronic cough, chronic left pleural effusion with insertion of left pleural catheter on 11/10/2015 by [name] who comes to the hospital due to fever, chills, worsening fatigue and malaise. Per patient, his daughter and wife, his chest tube drainage has been decreasing his output for the pastseveral days and he stopped completely the day PTA. He normally has up to 500 mL of drainage every other day. Patient was worked up and found to be septic and have a loculated effusion. TCV following patient, he went to OR on 10/19 and had the non functioning catheter removed and placed a new pigtail. Patient had a second chest tube placed in an adjacent collection on 10/20 which was removed on 10/22, currently with only one chest tube in place with minimal drainage. Initial drainage cultures positive for coag negative staph sensitive to penicillins, on Zosyn.  Assessment/Plan: Sepsis due to loculated Effusion/Empyema from Left Obstructed Pleural Catheter for recurrent and chronic Non Malignant Pleural Effusions with hypoxic respiratory failure - CT Scan on admission showed Moderate-sized loculated left pleural effusion collection with a PleurX drainage catheter in place.Patient was placed on broad-spectrum antibiotics of vancomycin and Zosyn  - Sepsis physiology resolved; Old left PleurX catheter removed by TCTS 10/19. Vancomycin was discontinued on 10/22, MRSAPCR negative. - IR placed CT Guided Fluid Drain by PigTail Catheter 10/16 and drained ~550 mL. Patient taken to OR 10/19 with removal of old catheter and placement of a left sided chest tube  - post op had significant leukocytosis, up to 25 >>  23.9 >>17.3>>15.5>>19.9>>15.2 - removed one of thechest tubes10/22 per TCTS - Fluid collected on 10/19 during surgery grew coagulase-negative staph which is pansensitive. Continue IV Zosyn for now -plan to d/c with amox/clav  Acute respiratory failure with hypoxia -secondary to empyema and CHF -04/14/16 CXR--increase pulm vasc congestion -04/15/16 CXR personally reviewd--unchanged interstitial edema  Acute diastolic CHF -Daily weights -Restart intravenous furosemide -UO 1.2 L with one dose IV lasix -10/26--remains clinically fluid overloaded -Daily BMP -Echocardiogram--EF 50-55%, grade 2 DD, HK inferior wall, mild MR, AS  Acute on chronic Renal failureCKD III - baseline Cr 1.3 - 1.7 - With increase in creatinine and his vancomycin was discontinued and his Avapro was also stopped. Creatinine is now improving.  -am BMP  Hx of Non-Hodgkins Lymphoma - PET-CT Scan showed Enlarging hypermetabolic mediastinal lymph nodes suggesting recurrent lymphoma. - Patient was seen by Hematologist/Oncologist [name],  -will have close outpatient follow up  - Continue scheduled follow-ups with Hematology/Oncology after empyema treatment.  Abdominal pain - patient withintermittent crampyabdominal pain, states that he occasionally  -Present for the past 3-4 months -suspect component of IBS -Tolerating diet - plain films normal - improved  MDS (myelodysplastic syndrome) with 5q deletion - Followed by Hematology/Oncology; Seen by [name] today - Continue Promacta.Patient's Hematologist increased it to 75 mg and will reassess next visit.  Hypokalemia - Improved, replete prn  Insulin Dependent Type 2 Diabetes Mellitus  - Continue Lantus and SSI,  -added novolog 2 units tiw  CAD  - Continue Metoprolol Succinate 100 mg by mouth daily and his Irbesartan was held due to renal function  - Clopidogrel was held by Hematology/Oncology because of Thrombocytopenia.  Essential  Hypertension -  Continue Metoprolol succinate100 mg po daily - Monitor blood pressure, BUN/creatinine and electrolytes.  Thrombocytopenia  - Platelet Count stable/improving - SCDs for DVT prophylaxis. - Continue Promacta. Patient's Hematologist increased it to 75 mg and will reassess next visit. - Hematology recommended stopping Clopidogrel; P2Y12 from 10/15 WNL   Normcytic Anemia - Continue Ferrous Sulfate 325 mg tablet po  -indices suggest anemia of chronic disease -baseline Hgb ~8   DVT prophylaxis:SCD Code Status:Full Family Communication:wife and daughter updated bedside 10/26--Total time spent 35 minutes.  Greater than 50% spent face to face counseling and coordinating care.   Disposition Plan:   Home in 2-3 days if cleared by TCTS  Consultants:  Cardiothoracic surgery  IR  HemOnc  Procedures:  CT Guided IR Pigtail Cathether Drain  Antimicrobials:  Vancomycin 10/14 >> 10/22  Zosyn 10/14 >>       Subjective: Patient denies fevers, chills, headache, chest pain, dyspnea, nausea, vomiting, diarrhea, abdominal pain, dysuria, hematuria, hematochezia, and melena.   Objective: Vitals:   04/14/16 1344 04/14/16 2227 04/15/16 0448 04/15/16 1333  BP: (!) 132/57 (!) 141/73 (!) 157/74 132/62  Pulse: 82 95 100 91  Resp: (!) 23 20 20  (!) 22  Temp: 98 F (36.7 C) 97.9 F (36.6 C) 98.6 F (37 C) 98.3 F (36.8 C)  TempSrc:  Oral Oral Oral  SpO2: 98% 97% 94% 98%  Weight:      Height:        Intake/Output Summary (Last 24 hours) at 04/15/16 1758 Last data filed at 04/15/16 1700  Gross per 24 hour  Intake             1250 ml  Output             2050 ml  Net             -800 ml   Weight change:  Exam:   General:  Pt is alert, follows commands appropriately, not in acute distress  HEENT: No icterus, No thrush, No neck mass, [place]/AT  Cardiovascular: RRR, S1/S2, no rubs, no gallops  Respiratory: bibasilar crackles  Abdomen: Soft/+BS, non  tender, non distended, no guarding  Extremities: 1 + LE edema, No lymphangitis, No petechiae, No rashes, no synovitis   Data Reviewed: I have personally reviewed following labs and imaging studies Basic Metabolic Panel:  Recent Labs Lab 04/11/16 0249 04/12/16 0215 04/13/16 0224 04/14/16 0243 04/15/16 0149  NA 140 141 140 142 141  K 3.5 3.9 3.6 3.7 3.7  CL 106 105 104 106 103  CO2 26 29 28 29 30   GLUCOSE 94 90 105* 116* 104*  BUN 19 21* 21* 20 19  CREATININE 2.10* 2.16* 2.11* 2.02* 2.00*  CALCIUM 7.8* 8.0* 7.8* 8.0* 7.8*   Liver Function Tests:  Recent Labs Lab 04/09/16 0419 04/10/16 0430  AST 17 14*  ALT 14* 13*  ALKPHOS 62 59  BILITOT 0.4 0.4  PROT 6.7 6.4*  ALBUMIN 1.8* 1.6*   No results for input(s): LIPASE, AMYLASE in the last 168 hours. No results for input(s): AMMONIA in the last 168 hours. Coagulation Profile: No results for input(s): INR, PROTIME in the last 168 hours. CBC:  Recent Labs Lab 04/10/16 0430 04/11/16 0249 04/12/16 0215 04/13/16 0224 04/14/16 0243 04/15/16 1422  WBC 23.9* 17.3* 16.6* 15.5* 19.8* 15.2*  NEUTROABS 18.4*  --   --   --   --   --   HGB 7.6* 7.7* 7.8* 7.7* 8.1* 7.6*  HCT 26.1* 26.3* 27.6* 26.5* 27.9*  26.6*  MCV 79.6 79.5 81.2 80.5 79.9 82.1  PLT 85* 99* 113* 96* 110* 75*   Cardiac Enzymes: No results for input(s): CKTOTAL, CKMB, CKMBINDEX, TROPONINI in the last 168 hours. BNP: Invalid input(s): POCBNP CBG:  Recent Labs Lab 04/14/16 1633 04/14/16 2155 04/15/16 0626 04/15/16 1101 04/15/16 1657  GLUCAP 145* 134* 94 179* 141*   HbA1C: No results for input(s): HGBA1C in the last 72 hours. Urine analysis:    Component Value Date/Time   COLORURINE AMBER (A) 04/03/2016 2007   APPEARANCEUR CLOUDY (A) 04/03/2016 2007   LABSPEC 1.022 04/03/2016 2007   PHURINE 5.0 04/03/2016 2007   GLUCOSEU NEGATIVE 04/03/2016 2007   HGBUR SMALL (A) 04/03/2016 2007   BILIRUBINUR NEGATIVE 04/03/2016 2007   [place] NEGATIVE  04/03/2016 2007   PROTEINUR 100 (A) 04/03/2016 2007   NITRITE NEGATIVE 04/03/2016 2007   LEUKOCYTESUR NEGATIVE 04/03/2016 2007   Sepsis Labs: @LABRCNTIP (procalcitonin:4,lacticidven:4) ) Recent Results (from the past 240 hour(s))  Surgical pcr screen     Status: None   Collection Time: 04/08/16  5:03 AM  Result Value Ref Range Status   MRSA, PCR NEGATIVE NEGATIVE Final   Staphylococcus aureus NEGATIVE NEGATIVE Final    Comment:        The Xpert SA Assay (FDA approved for NASAL specimens in patients over 23 years of age), is one component of a comprehensive surveillance program.  Test performance has been validated by [facility] for patients greater than or equal to 55 year old. It is not intended to diagnose infection nor to guide or monitor treatment.   Anaerobic culture     Status: None   Collection Time: 04/08/16 11:56 AM  Result Value Ref Range Status   Specimen Description FLUID LEFT PLEURAL  Final   Special Requests PATIENT ON FOLLOWING  ZOSYN AND VANCOMYCIN  Final   Culture NO ANAEROBES ISOLATED  Final   Report Status 04/12/2016 FINAL  Final  Body fluid culture     Status: None   Collection Time: 04/08/16 11:56 AM  Result Value Ref Range Status   Specimen Description FLUID LEFT PLEURAL  Final   Special Requests PATIENT ON FOLLOWING  ZOSYN AND VANCOMYCIN  Final   Gram Stain   Final    RARE WBC PRESENT, PREDOMINANTLY PMN NO ORGANISMS SEEN    Culture   Final    RARE STAPHYLOCOCCUS SPECIES (COAGULASE NEGATIVE) CRITICAL RESULT CALLED TO, READ BACK BY AND VERIFIED WITH: J JOYCE,RN AT 1635 04/11/16 BY L BENFIELD    Report Status 04/12/2016 FINAL  Final   Organism ID, Bacteria STAPHYLOCOCCUS SPECIES (COAGULASE NEGATIVE)  Final      Susceptibility   Staphylococcus species (coagulase negative) - MIC*    CIPROFLOXACIN <=0.5 SENSITIVE Sensitive     ERYTHROMYCIN <=0.25 SENSITIVE Sensitive     GENTAMICIN <=0.5 SENSITIVE Sensitive     OXACILLIN 0.5 SENSITIVE Sensitive       TETRACYCLINE <=1 SENSITIVE Sensitive     VANCOMYCIN <=0.5 SENSITIVE Sensitive     TRIMETH/SULFA <=10 SENSITIVE Sensitive     CLINDAMYCIN <=0.25 SENSITIVE Sensitive     RIFAMPIN <=0.5 SENSITIVE Sensitive     Inducible Clindamycin NEGATIVE Sensitive     * RARE STAPHYLOCOCCUS SPECIES (COAGULASE NEGATIVE)     Scheduled Meds: . [START ON 05/01/2016] cyanocobalamin  1,000 mcg Intramuscular Q30 days  . dextromethorphan  30 mg Oral QHS  . doxazosin  4 mg Oral QHS  . eltrombopag  75 mg Oral QAC lunch  . ferrous sulfate  325  mg Oral QPC supper  . fluticasone  1 spray Each Nare QHS  . furosemide  40 mg Intravenous Daily  . guaiFENesin  600 mg Oral BID  . insulin aspart  0-15 Units Subcutaneous TID WC  . insulin aspart  2 Units Subcutaneous TID WC  . insulin glargine  15 Units Subcutaneous Daily  . metoprolol succinate  100 mg Oral Daily  . multivitamin with minerals  1 tablet Oral QHS  . piperacillin-tazobactam (ZOSYN)  IV  3.375 g Intravenous Q8H  . sodium chloride flush  3 mL Intravenous Q12H   Continuous Infusions:   Procedures/Studies: Dg Chest 2 View  Result Date: 04/07/2016 CLINICAL DATA:  Left-sided chest tube, weakness, history of recurrent 9 malignant left pleural effusion. Also history of lymphoma EXAM: CHEST  2 VIEW COMPARISON:  Chest x-ray of 04/06/2016 and CT chest of 04/03/2016 FINDINGS: The left chest tube remains and no residual pneumothorax is seen. Apparently loculated left pleural effusion is unchanged. The pigtail of the catheter lies just superior to the superior extent of the apparently loculated effusion. Mild bibasilar atelectasis is present. Heart size is stable. IMPRESSION: 1. No pneumothorax is currently seen. 2. No change in left chest tube and apparently loculated left pleural effusion. Electronically Signed   By: Ivar Drape M.D.   On: 04/07/2016 13:23   Dg Chest 2 View  Result Date: 04/03/2016 CLINICAL DATA:  Sepsis.  Concern for empyema EXAM: CHEST  2  VIEW COMPARISON:  03/03/2016 FINDINGS: Loculated left pleural effusion, moderate size, thickest at the base. There is a tunneled pleural catheter at the left base with alignment that is stable from 04/02/2016. The underlying lung is partly atelectatic. Normal heart size. The lower aorta is obscured by a medial pleural disease. IMPRESSION: Moderate loculated left pleural effusion with tunneled pleural catheter. Appearance is stable compared to 04/02/2016 PET-CT. Electronically Signed   By: Monte Fantasia M.D.   On: 04/03/2016 20:58   Ct Chest W Contrast  Result Date: 04/03/2016 CLINICAL DATA:  82 year old male with fever. Patient with left pleural drainage catheter. History of lymphoma. EXAM: CT CHEST WITH CONTRAST TECHNIQUE: Multidetector CT imaging of the chest was performed during intravenous contrast administration. CONTRAST:  55mL ISOVUE-300 IOPAMIDOL (ISOVUE-300) INJECTION 61% COMPARISON:  04/02/2016 PET-CT and prior studies. FINDINGS: Cardiovascular: Upper limits normal heart size and moderate coronary artery calcifications noted. There is no evidence of thoracic aortic aneurysm. Mediastinum/Nodes: Multiple shotty and enlarged mediastinal lymph nodes are identified, the largest in the lower thoracic para-aortic region. These mediastinal lymph nodes for mildly hypermetabolic on recent PET study suspicious for recurrent lymphoma. There is no evidence of pericardial effusion. Lungs/Pleura: A moderate loculated left pleural effusion is again identified. A left pleural catheter with tip in the posterior-inferior left pleural space again noted. Left lower lung atelectasis identified. Mild interlobular septal thickening is noted within both lungs. No discrete mass identified. There is no evidence of pneumothorax or right pleural effusion. Upper Abdomen: No acute abnormality. Upper limits normal spleen size noted. Musculoskeletal: No acute or suspicious abnormality. Degenerative disc disease in the lower  cervical spine identified. IMPRESSION: Moderate loculated left pleural effusion, with left pleural catheter tip in the posterior inferior left pleural space. Associated left lower lung atelectasis. Mediastinal lymph nodes as described, mildly hypermetabolic on recent PET study suspicious for recurrent lymphoma. Coronary artery disease. Electronically Signed   By: Margarette Canada M.D.   On: 04/03/2016 21:47   Nm Pet Image Restag (ps) Skull Base To Thigh  Result Date:  04/02/2016 CLINICAL DATA:  Subsequent treatment strategy for lymphoma. EXAM: NUCLEAR MEDICINE PET SKULL BASE TO THIGH TECHNIQUE: 12.4 mCi F-18 FDG was injected intravenously. Full-ring PET imaging was performed from the skull base to thigh after the radiotracer. CT data was obtained and used for attenuation correction and anatomic localization. FASTING BLOOD GLUCOSE:  Value: 119 mg/dl COMPARISON:  10/30/2014 FINDINGS: NECK No hypermetabolic lymph nodes in the neck. CHEST There is a moderate-sized left loculated pleural effusion. No hypermetabolism to suggest a malignant effusion. It measures as simple fluid. New line small nodules are noted along the left major fissure. These are likely lymph nodes. Is also 3 subpleural nodules in the left upper on image number 104. No obvious hypermetabolism. SUV max is 1.7. These are suspicious for lymphomatous nodules however and they are new since the prior study. Enlarging mediastinal lymph nodes are mildly hypermetabolic and suspicious for recurrent lymphoma. 5 mm pretracheal lymph node on image number 87 has an SUV max of 2.56. Multiple small prevascular lymph nodes. The largest node measures 5 mm on image number 100 and SUV max is 2.46. 11 mm aorticopulmonary window lymph node on image number 94 has an SUV max of 3.5. Subcarinal lymph node on image number 109 measures 10 mm and has an SUV max of 3.5. Left-sided retrocrural lymph node measures 23 x 16 mm on image number 127 and has SUV max of 5.9. Right-sided  periaortic lymph node on image number 121 measures 8.5 mm and SUV max is 4.1. ABDOMEN/PELVIS No abnormal hypermetabolic activity within the liver, pancreas, adrenal glands, or spleen. No hypermetabolic lymph nodes in the abdomen or pelvis. Stable advanced atherosclerotic calcifications involving the aorta and branch vessels. The right renal artery stent is noted. Brachytherapy seeds are noted the prostate gland. There is a large right inguinal hernia containing fat. SKELETON No focal hypermetabolic activity to suggest skeletal metastasis. IMPRESSION: 1. Enlarging hypermetabolic mediastinal lymph nodes suggesting recurrent lymphoma. 2. New nodules along the left major fissure and in the left upper lobe subpleural space worrisome for lymphomatous involvement. 3. Moderate-sized loculated left pleural effusion collection with a PleurX drainage catheter in place. 4. No findings for adenopathy in the neck, abdomen or pelvis. Electronically Signed   By: Marijo Sanes M.D.   On: 04/02/2016 14:52   Dg Chest Port 1 View  Result Date: 04/15/2016 CLINICAL DATA:  Chest 2 present. Shortness of breath. Pulmonary edema. EXAM: PORTABLE CHEST 1 VIEW COMPARISON:  1 day prior FINDINGS: Left-sided small bore chest tube is unchanged in position. Midline trachea. Cardiomegaly accentuated by AP portable technique. Apparent superior mediastinal soft tissue fullness is favored to be due to AP portable technique. Atherosclerosis in the transverse aorta. Loculated left-sided pleural effusion is small and similar given differences in technique. No pneumothorax. Interstitial edema is moderate, accentuated by diminished lung volumes today. Slight worsening left base aeration with persistent bibasilar airspace opacities. IMPRESSION: Small bore left-sided chest tube remaining in place with persistent loculated left-sided effusion. No pneumothorax. Diminished lung volumes. Persistent interstitial edema with similar right and increased left base  airspace disease. Electronically Signed   By: Abigail Miyamoto M.D.   On: 04/15/2016 07:51   Dg Chest Port 1 View  Result Date: 04/14/2016 CLINICAL DATA:  Left chest tube followup EXAM: PORTABLE CHEST 1 VIEW COMPARISON:  04/12/2016 FINDINGS: Left-sided chest tube is in place. No pneumothorax identified. The left pleural effusion is not significantly changed in volume compared with previous exam. Moderate diffuse pulmonary edema is identified which appears increased  in the interval. IMPRESSION: 1. Stable appearance of left chest tube. No change in volume of left effusion. 2. Increase in diffuse pulmonary edema pattern. Electronically Signed   By: Kerby Moors M.D.   On: 04/14/2016 08:52   Dg Chest Port 1 View  Result Date: 04/12/2016 CLINICAL DATA:  Followup left-sided pleural effusion. EXAM: PORTABLE CHEST 1 VIEW COMPARISON:  04/10/2016 FINDINGS: The left-sided pleural drainage catheter is stable. There is a persistent small to moderate-sized left pleural effusion with overlying atelectasis. Persistent central vascular congestion. IMPRESSION: Persistent left-sided pleural effusion and overlying atelectasis. Persistent central vascular congestion Electronically Signed   By: Marijo Sanes M.D.   On: 04/12/2016 09:13   Dg Chest Port 1 View  Result Date: 04/10/2016 CLINICAL DATA:  Followup for left chest tube. Left pleural effusion. EXAM: PORTABLE CHEST 1 VIEW COMPARISON:  04/09/2016 FINDINGS: Left-sided pigtail catheter is stable, projecting along the lateral left mid to upper hemi thorax. There is persistent left pleural fluid and left lung base parenchymal opacity. No new lung abnormalities. No pneumothorax. IMPRESSION: 1. No change from the previous day's study. 2. Stable left chest tube.  No pneumothorax. 3. Persistent left lung base opacity with an associated pleural effusion. Electronically Signed   By: Lajean Manes M.D.   On: 04/10/2016 07:51   Dg Chest Port 1 View  Result Date:  04/09/2016 CLINICAL DATA:  Soreness in chest.  Chest tube present. EXAM: PORTABLE CHEST 1 VIEW COMPARISON:  04/08/2016.  04/03/2016. FINDINGS: Again noted is a left basilar chest tube and a small bore pigtail chest tube in the lateral upper chest. There continues to be evidence for a loculated left pleural fluid which is caudal to the pigtail catheter. Evidence for compressive atelectasis at the left lung base. Slightly increased densities in the right lower lung. Heart size is stable. Trachea is midline. Negative for pneumothorax. IMPRESSION: No change in the loculated left pleural effusion. Stable position of the left chest tubes without a pneumothorax. Electronically Signed   By: Markus Daft M.D.   On: 04/09/2016 08:56   Dg Chest Port 1 View  Result Date: 04/08/2016 CLINICAL DATA:  Shortness of breath. EXAM: PORTABLE CHEST 1 VIEW COMPARISON:  04/08/2016.  04/07/2016. FINDINGS: PleurX catheter again noted on the left in stable position. No pneumothorax. Cardiomegaly with bilateral pulmonary interstitial prominence and left-sided pleural effusion noted consistent congestive heart failure. Similar finding noted on prior exam from earlier today. IMPRESSION: 1. Left chest tube noted in stable position. No pneumothorax. Stable left pleural effusion. 2. Persistent changes of congestive heart failure with pulmonary interstitial edema. No interim change from earlier exam today. Electronically Signed   By: Marcello Moores  Register   On: 04/08/2016 13:36   Dg Chest Portable 1 View  Result Date: 04/08/2016 CLINICAL DATA:  PleurX catheter placement. EXAM: PORTABLE CHEST 1 VIEW COMPARISON:  04/07/2016. FINDINGS: PleurX catheter on the left again noted. Small left pleural effusion again noted. No pneumothorax . Cardiomegaly with pulmonary vascular prominence and mild interstitial prominence noted. Mild component congestive heart failure cannot be excluded. IMPRESSION: 1. PleurX catheter on the left again noted. No  pneumothorax. Small left pleural effusion again noted. 2. Cannot exclude mild congestive heart failure with mild interstitial edema. Electronically Signed   By: Marcello Moores  Register   On: 04/08/2016 12:31   Dg Chest Port 1 View  Addendum Date: 04/07/2016   ADDENDUM REPORT: 04/07/2016 08:27 ADDENDUM: Critical Value/emergent results were called by telephone at the time of interpretation on 04/07/2016 at 8:27 am  to the patient's interventional radiologist, who verbally acknowledged these results. Electronically Signed   By: Marcello Moores  Register   On: 04/07/2016 08:27   Result Date: 04/07/2016 CLINICAL DATA:  Shortness of breath. EXAM: PORTABLE CHEST 1 VIEW COMPARISON:  CT 04/05/2016. FINDINGS: Left chest tube noted. Previously identified large left left up pleural effusion is been removed. Residual left lower pleural effusion. Tiny left pneumothorax present. This was discussed with the patient's interventional radiologist. Stable cardiomegaly. No acute bony abnormality. IMPRESSION: Left chest tube noted in good anatomic position. Significant resolution of left-sided pneumothorax. Residual left lower pleural effusion present. Miniscule left pneumothorax noted. Electronically Signed: By: Marcello Moores  Register On: 04/06/2016 16:30   Dg Chest Port 1 View  Result Date: 04/05/2016 CLINICAL DATA:  Preprocedural evaluation for pleural drainage catheter. EXAM: PORTABLE CHEST 1 VIEW COMPARISON:  CT 04/03/2016.  Chest x-ray 04/03/2016 . FINDINGS: Progressive left pleural effusion noted. Some degree of loculation may be present. Mild cardiomegaly with mild interstitial prominence, a component of mild congestive heart failure cannot be excluded. No pneumothorax . IMPRESSION: 1. Progressive left pleural effusion with possible loculation. 2. Cardiomegaly with mild interstitial prominence. A component of mild congestive heart failure cannot be excluded . Electronically Signed   By: Marcello Moores  Register   On: 04/05/2016 08:01   Dg Abd  Portable 2v  Result Date: 04/10/2016 CLINICAL DATA:  Abdominal pain EXAM: PORTABLE ABDOMEN - 2 VIEW COMPARISON:  None. FINDINGS: Scattered large and small bowel gas is noted. A left thoracostomy catheter is noted over the base. No obstructive changes are seen. No free air is noted. Mild degenerative changes of the lumbar spine are seen. IMPRESSION: No acute abnormality noted. Electronically Signed   By: Inez Catalina M.D.   On: 04/10/2016 08:58   Ct Image Guided Fluid Drain By Catheter  Result Date: 04/05/2016 CLINICAL DATA:  Lymphoma, and left-sided empyema with residual loculated left pleural fluid remaining. Request has been made to place a percutaneous pleural drainage catheter. EXAM: CT GUIDED LEFT THORACOSTOMY TUBE PLACEMENT FOR EMPYEMA ANESTHESIA/SEDATION: 1.0 mg IV Versed 25 mcg IV Fentanyl Total Moderate Sedation Time:  20 minutes The patient's level of consciousness and physiologic status were continuously monitored during the procedure by Radiology nursing. PROCEDURE: The procedure, risks, benefits, and alternatives were explained to the patient's wife. Questions regarding the procedure were encouraged and answered. The patient's wife understands and consents to the procedure. A time out was performed prior to initiating the procedure. The left upper chest wall was prepped with chlorhexidine in a sterile fashion, and a sterile drape was applied covering the operative field. A sterile gown and sterile gloves were used for the procedure. Local anesthesia was provided with 1% Lidocaine. CT was performed in a supine position. After localizing the most appropriate target for pleural drainage, an 18 gauge trocar needle was advanced under CT guidance into the left pleural space. Fluid was aspirated. A guidewire was then advanced through the needle and the needle removed. The percutaneous tract was dilated over the guidewire. A 12 French pigtail drainage catheter was then advanced over the wire. After the  catheter was formed, additional CT images were performed. The catheter was then connected to a [place] Pleur-Evac device. COMPLICATIONS: None FINDINGS: The largest loculation of residual left pleural fluid is in the lateral superior hemithorax. Aspiration yielded clear, yellowish fluid. Thorax. After pleural drain placement, there is return of yellowish colored clear fluid. IMPRESSION: CT-guided left chest thoracostomy tube placement with placement of 12 French drain into the left  pleural space. The site of loculated fluid in the superior and lateral aspect of the hemithorax was targeted for catheter placement. Electronically Signed   By: Aletta Edouard M.D.   On: 04/05/2016 15:54    Leeba Barbe, DO  [facility] Pager [phone]  If 7PM-7AM, please contact night-coverage [email or web address] Password [email or web address] 04/15/2016, 5:58 PM   LOS: 12 days

## 2016-04-16 DIAGNOSIS — I5031 Acute diastolic (congestive) heart failure: Secondary | ICD-10-CM

## 2016-04-16 DIAGNOSIS — J9600 Acute respiratory failure, unspecified whether with hypoxia or hypercapnia: Secondary | ICD-10-CM

## 2016-04-16 LAB — BASIC METABOLIC PANEL
ANION GAP: 5 (ref 5–15)
BUN: 18 mg/dL (ref 6–20)
CHLORIDE: 103 mmol/L (ref 101–111)
CO2: 32 mmol/L (ref 22–32)
Calcium: 7.7 mg/dL — ABNORMAL LOW (ref 8.9–10.3)
Creatinine, Ser: 1.99 mg/dL — ABNORMAL HIGH (ref 0.61–1.24)
GFR calc Af Amer: 34 mL/min — ABNORMAL LOW (ref >60)
GFR, EST NON AFRICAN AMERICAN: 29 mL/min — AB (ref >60)
Glucose, Bld: 117 mg/dL — ABNORMAL HIGH (ref 65–99)
POTASSIUM: 3.4 mmol/L — AB (ref 3.5–5.1)
SODIUM: 140 mmol/L (ref 135–145)

## 2016-04-16 LAB — GLUCOSE, CAPILLARY
GLUCOSE-CAPILLARY: 172 mg/dL — AB (ref 65–99)
GLUCOSE-CAPILLARY: 141 mg/dL — AB (ref 65–99)
GLUCOSE-CAPILLARY: 75 mg/dL (ref 65–99)
Glucose-Capillary: 125 mg/dL — ABNORMAL HIGH (ref 65–99)

## 2016-04-16 LAB — CBC
HEMATOCRIT: 25.5 % — AB (ref 39.0–52.0)
HEMOGLOBIN: 7.5 g/dL — AB (ref 13.0–17.0)
MCH: 23.5 pg — ABNORMAL LOW (ref 26.0–34.0)
MCHC: 29.4 g/dL — ABNORMAL LOW (ref 30.0–36.0)
MCV: 79.9 fL (ref 78.0–100.0)
Platelets: 79 10*3/uL — ABNORMAL LOW (ref 150–400)
RBC: 3.19 MIL/uL — ABNORMAL LOW (ref 4.22–5.81)
RDW: 23.9 % — ABNORMAL HIGH (ref 11.5–15.5)
WBC: 13.4 10*3/uL — AB (ref 4.0–10.5)

## 2016-04-16 MED ORDER — FUROSEMIDE 10 MG/ML IJ SOLN
40.0000 mg | Freq: Two times a day (BID) | INTRAMUSCULAR | Status: DC
  Administered 2016-04-16 – 2016-04-17 (×2): 40 mg via INTRAVENOUS
  Filled 2016-04-16 (×2): qty 4

## 2016-04-16 MED ORDER — POTASSIUM CHLORIDE CRYS ER 20 MEQ PO TBCR
20.0000 meq | EXTENDED_RELEASE_TABLET | Freq: Every day | ORAL | Status: DC
  Administered 2016-04-16 – 2016-04-20 (×5): 20 meq via ORAL
  Filled 2016-04-16 (×5): qty 1

## 2016-04-16 MED ORDER — AMOXICILLIN-POT CLAVULANATE 875-125 MG PO TABS
1.0000 | ORAL_TABLET | Freq: Two times a day (BID) | ORAL | Status: DC
  Administered 2016-04-16 – 2016-04-20 (×8): 1 via ORAL
  Filled 2016-04-16 (×8): qty 1

## 2016-04-16 NOTE — Progress Notes (Signed)
Physical Therapy Treatment Patient Details Name: Ricardo Watkins MRN: [id number] DOB: [birth year] Today's Date: 04/16/2016    History of Present Illness 82 y.o. male with medical history significant of non-Hodgkin's lymphoma, CAD, type 2 diabetes, hyperlipidemia, hypertension, anemia, prostate CA, diverticulosis, cataracts, DVT, chronic cough, chronic left pleural effusion with insertion of left pleural catheter on 11/10/2015 by [name] who comes to the hospital due to fever, chills, worsening fatigue and malaise.  Patient s/p removal of pleural draingae catheter and insertion of chest tube Lt. on 04/08/16.    PT Comments    Pt is up to walk with PT and maintains O2 sat 97% but is quite anemic and pale.  Took a length of time to ambulate and avoid rushing pt through his mobility.  Will continue acutely to ambulate and increase tolerance OOB, and will expect transition home with family to go well.  Pt is going to need to walk up a ramp to enter house and will add a higher surface to check this if needed.  Follow Up Recommendations  Home health PT;Supervision/Assistance - 24 hour     Equipment Recommendations  None recommended by PT    Recommendations for Other Services Rehab consult     Precautions / Restrictions Precautions Precautions: Fall (telemetry) Precaution Comments: Chest tube left Restrictions Weight Bearing Restrictions: No    Mobility  Bed Mobility               General bed mobility comments: up in chair  Transfers Overall transfer level: Needs assistance Equipment used: Rolling walker (2 wheeled) Transfers: Sit to/from [facility] Sit to Stand: Min assist Stand pivot transfers: Min guard;Min assist       General transfer comment: cues for all hand placement and tactile needed for sitting   Ambulation/Gait Ambulation/Gait assistance: Min assist;Min guard Ambulation Distance (Feet): 260 Feet Assistive device: Rolling walker (2  wheeled) Gait Pattern/deviations: Step-through pattern;Step-to pattern;Decreased stride length;Wide base of support;Trunk flexed (prompts to avoid obstacles and to stand up ) Gait velocity: reduced then tried to rush himself Gait velocity interpretation: Below normal speed for age/gender     Stairs            Wheelchair Mobility    Modified Rankin (Stroke Patients Only)       Balance Overall balance assessment: Needs assistance Sitting-balance support: Feet supported Sitting balance-Leahy Scale: Fair     Standing balance support: Bilateral upper extremity supported Standing balance-Leahy Scale: Fair                      Cognition Arousal/Alertness: Awake/alert Behavior During Therapy: Flat affect Overall Cognitive Status: Within Functional Limits for tasks assessed                      Exercises      General Comments General comments (skin integrity, edema, etc.): Pt has 3L O2, chest tube L side, IV in R arm to navigate on hallway and still is handling well      Pertinent Vitals/Pain Pain Assessment: Faces Faces Pain Scale: Hurts little more Pain Location: L side of chest Pain Descriptors / Indicators: Operative site guarding Pain Intervention(s): Monitored during session;Premedicated before session;Repositioned    Home Living                      Prior Function            PT Goals (current goals can now be  found in the care plan section) Acute Rehab PT Goals Patient Stated Goal: to feel better, a bit lethargic Progress towards PT goals: Progressing toward goals    Frequency    Min 3X/week      PT Plan Current plan remains appropriate    Co-evaluation             End of Session Equipment Utilized During Treatment: Gait belt;Oxygen Activity Tolerance: Patient tolerated treatment well;Other (comment) (maintained O2 sat to 97%) Patient left: in bed;with call bell/phone within reach;with bed alarm set     Time:  1151-1220 PT Time Calculation (min) (ACUTE ONLY): 29 min  Charges:  $Gait Training: 8-22 mins $Therapeutic Activity: 8-22 mins                    G Codes:      Ramond Dial April 17, 2016, 1:07 PM   Mee Hives, PT MS Acute Rehab Dept. Number: [place] [facility] [place]

## 2016-04-16 NOTE — Progress Notes (Signed)
      [address]       [place] [address]             [phone]      8 Days Post-Op Procedure(s) (LRB): REMOVAL OF PLEURAL DRAINAGE CATHETER (Left) CHEST TUBE INSERTION (Left) Subjective: Sleepy but responsive. No issues overnight.   Objective: Vital signs in last 24 hours: Temp:  [98.2 F (36.8 C)-99.6 F (37.6 C)] 98.2 F (36.8 C) (10/27 0512) Pulse Rate:  [49-91] 89 (10/27 0512) Cardiac Rhythm: Sinus tachycardia;Other (Comment) (10/26 1900) Resp:  [20-22] 20 (10/27 0512) BP: (132-152)/(62-76) 138/66 (10/27 0512) SpO2:  [94 %-98 %] 94 % (10/27 0512) Weight:  [189 lb 4.8 oz (85.9 kg)-198 lb 8 oz (90 kg)] 189 lb 4.8 oz (85.9 kg) (10/27 0512)     Intake/Output from previous day: 10/26 0701 - 10/27 0700 In: 940 [P.O.:840; IV Piggyback:100] Out: 1420 [Urine:1420] Intake/Output this shift: No intake/output data recorded.  General appearance: cooperative and no distress Heart: regular rate and rhythm Lungs: clear to auscultation bilaterally Abdomen: soft, non-tender; bowel sounds normal; no masses,  no organomegaly Extremities: extremities normal, atraumatic, no cyanosis or edema Wound: clean and dry  Lab Results:  Recent Labs  04/15/16 1422 04/16/16 0147  WBC 15.2* 13.4*  HGB 7.6* 7.5*  HCT 26.6* 25.5*  PLT 75* 79*   BMET:  Recent Labs  04/15/16 0149 04/16/16 0147  NA 141 140  K 3.7 3.4*  CL 103 103  CO2 30 32  GLUCOSE 104* 117*  BUN 19 18  CREATININE 2.00* 1.99*  CALCIUM 7.8* 7.7*    PT/INR: No results for input(s): LABPROT, INR in the last 72 hours. ABG No results found for: PHART, HCO3, TCO2, ACIDBASEDEF, O2SAT CBG (last 3)   Recent Labs  04/15/16 1657 04/15/16 2111 04/16/16 0603  GLUCAP 141* 114* 125*    Assessment/Plan: S/P Procedure(s) (LRB): REMOVAL OF PLEURAL DRAINAGE CATHETER (Left) CHEST TUBE INSERTION (Left)  1 doing well with slow/steady improvement 2 conts pigtail. Drainage went from 350ml/24 hours to  46ml/24 hours per documentation, will discuss with attending.  3 cont pulm toilet/rehab 5 leukocytosis up from 19.8 to 13.4, will trend. Remains on IV antibiotics.  6 anemic- monitor closely, microcytic- on Fe++ and monthly B12(?) Hemoglobin is 7.5 today 7 platelets stable at 79 8 medical management per primary  I will schedule him for an appointment with [name] today. Possibly home this weekend.    LOS: 13 days    Elgie Collard 04/16/2016

## 2016-04-16 NOTE — Care Management Important Message (Signed)
Important Message  Patient Details  Name: Ricardo Watkins MRN: [id number] Date of Birth: 03/16/1931   Medicare Important Message Given:  Yes    Hosam Mcfetridge Abena 04/16/2016, 2:42 PM

## 2016-04-16 NOTE — Progress Notes (Signed)
PROGRESS NOTE  MICHAELL GRIDER WYO:[id number] DOB: [birth year] DOA: 04/03/2016 PCP: [name], MD   Brief History:  82 y.o.malewith medical history significant of non-Hodgkin's lymphoma, CAD, type 2 diabetes, hyperlipidemia, hypertension, anemia, prostate CA, diverticulosis, cataracts, DVT, chronic cough, chronic left pleural effusion with insertion of left pleural catheter on 11/10/2015 by [name] who comes to the hospital due to fever, chills, worsening fatigue and malaise. Per patient, his daughter and wife, his chest tube drainage has been decreasing his output for the pastseveral days and he stopped completely the day PTA. He normally has up to 500 mL of drainage every other day. Patient was worked up and found to be septic and have a loculated effusion. TCV following patient, he went to OR on 10/19 and had the non functioning catheter removed and placed a new pigtail. Patient had a second chest tube placed in an adjacent collection on 10/20 which was removed on 10/22, currently with only one chest tube in place with minimal drainage. Initial drainage cultures positive for coag negative staph sensitive to penicillins, on Zosyn.  Assessment/Plan: Sepsis due to loculated Effusion/Empyema from Left Obstructed Pleural Catheter for recurrent and chronic Non Malignant Pleural Effusions with hypoxic respiratory failure - CT Scan on admission showed Moderate-sized loculated left pleural effusion collection with a PleurX drainage catheter in place.Patient was placed on broad-spectrum antibiotics of vancomycin and Zosyn  - Sepsis physiology resolved; Old left PleurX catheter removed by TCTS10/19. Vancomycin was discontinued on 10/22, MRSAPCR negative. - IR placed CT Guided Fluid Drain by PigTail Catheter 10/16 and drained ~550 mL. Patient taken to OR 10/19 with removal of old catheter and placement of a left sided chest tube  - post op had significant leukocytosis, up to 25 >>  23.9 >>17.3>>15.5>>19.9>>15.2>>13.4 - removed one of thechest tubes10/22 per TCTS - Fluid collected on 10/19 during surgery grew coagulase-negative staph which is pansensitive.  -10/27--d/c zosyn, start augmentin  Acute respiratory failure with hypoxia -secondary to empyema and CHF -04/14/16 CXR--increase pulm vasc congestion -04/15/16 CXR personally reviewd--unchanged interstitial edema -stable on 2 L   Acute diastolic CHF -Daily weights -Restart intravenous furosemide -UO 2.6 L with 2 dose IV lasix -10/27--remains clinically fluid overloaded -10/27--increase lasix to 40 mg IV bid -Daily BMP -Echocardiogram--EF 50-55%, grade 2 DD, HK inferior wall, mild MR, AS -continue metoprolol succinate  Acute on chronic Renal failureCKD III - baseline Cr 1.3 - 1.7 - With increase in creatinine and his vancomycin was discontinued and his Avapro was also stopped.  -am BMP -pt likely will have new baseline  Hx of Non-Hodgkins Lymphoma - PET-CT Scan showed Enlarging hypermetabolic mediastinal lymph nodes suggesting recurrent lymphoma. - Patient was seen by Hematologist/Oncologist [name],  -will have close outpatient follow up  - Continue scheduled follow-ups with Hematology/Oncology after empyema treatment.  Abdominal pain - patient withintermittent crampyabdominal pain, states that he occasionally  -Present for the past 3-4 months -suspect component of IBS -Tolerating diet - plain films normal - improved  MDS (myelodysplastic syndrome) with 5q deletion - Followed by Hematology/Oncology; Seen by [name] today - Continue Promacta.Patient's Hematologist increased it to 75 mg and will reassess next visit.  Hypokalemia - Improved, replete prn  Insulin Dependent Type 2 Diabetes Mellitus  - Continue Lantus and SSI,  -added novolog 2 units tiw -overall controlled  CAD  - Continue Metoprolol Succinate 100 mg by mouth daily -Irbesartan was held due to renal  function  -  Clopidogrel was heldby Hematology/Oncology because of Thrombocytopenia.  Essential Hypertension - Continue Metoprolol succinate100 mg po daily -expect continued improvement with diuresis  Thrombocytopenia  - Platelet Count stable/improving - SCDs for DVT prophylaxis. - Continue Promacta. Patient's Hematologist increased it to 75 mg and will reassess next visit. - Hematology recommended stopping Clopidogrel; P2Y12 from 10/15 WNL   Normcytic Anemia - Continue Ferrous Sulfate 325 mg tablet po  -indices suggest anemia of chronic disease -baseline Hgb ~8   DVT prophylaxis:SCD Code Status:Full Family Communication:wife and daughter updatedbedside 10/26 Greater than 50% spent face to face counseling and coordinating care.   Disposition Plan:   Home in 2-3 days if cleared by TCTS  Consultants:  Cardiothoracic surgery  IR  HemOnc  Procedures:  CT Guided IR Pigtail Cathether Drain  Antimicrobials:  Vancomycin 10/14 >> 10/22  Zosyn 10/14 >>04/16/16  Augmentin 04/16/16>>>    Subjective:   Objective: Vitals:   04/15/16 1333 04/15/16 1955 04/16/16 0512 04/16/16 1359  BP: 132/62 (!) 152/76 138/66 136/65  Pulse: 91 (!) 49 89 89  Resp: (!) 22 20 20 17   Temp: 98.3 F (36.8 C) 99.6 F (37.6 C) 98.2 F (36.8 C) 97.8 F (36.6 C)  TempSrc: Oral Oral Oral Oral  SpO2: 98% 98% 94% 96%  Weight:  90 kg (198 lb 8 oz) 85.9 kg (189 lb 4.8 oz)   Height:        Intake/Output Summary (Last 24 hours) at 04/16/16 1626 Last data filed at 04/16/16 1300  Gross per 24 hour  Intake              860 ml  Output             1851 ml  Net             -991 ml   Weight change:  Exam:   General:  Pt is alert, follows commands appropriately, not in acute distress  HEENT: No icterus, No thrush, No neck mass, [place]/AT  Cardiovascular: RRR, S1/S2, no rubs, no gallops  Respiratory: CTA bilaterally, no wheezing, no crackles, no rhonchi  Abdomen: Soft/+BS,  non tender, non distended, no guarding  Extremities: No edema, No lymphangitis, No petechiae, No rashes, no synovitis   Data Reviewed: I have personally reviewed following labs and imaging studies Basic Metabolic Panel:  Recent Labs Lab 04/12/16 0215 04/13/16 0224 04/14/16 0243 04/15/16 0149 04/16/16 0147  NA 141 140 142 141 140  K 3.9 3.6 3.7 3.7 3.4*  CL 105 104 106 103 103  CO2 29 28 29 30  32  GLUCOSE 90 105* 116* 104* 117*  BUN 21* 21* 20 19 18   CREATININE 2.16* 2.11* 2.02* 2.00* 1.99*  CALCIUM 8.0* 7.8* 8.0* 7.8* 7.7*   Liver Function Tests:  Recent Labs Lab 04/10/16 0430  AST 14*  ALT 13*  ALKPHOS 59  BILITOT 0.4  PROT 6.4*  ALBUMIN 1.6*   No results for input(s): LIPASE, AMYLASE in the last 168 hours. No results for input(s): AMMONIA in the last 168 hours. Coagulation Profile: No results for input(s): INR, PROTIME in the last 168 hours. CBC:  Recent Labs Lab 04/10/16 0430  04/12/16 0215 04/13/16 0224 04/14/16 0243 04/15/16 1422 04/16/16 0147  WBC 23.9*  < > 16.6* 15.5* 19.8* 15.2* 13.4*  NEUTROABS 18.4*  --   --   --   --   --   --   HGB 7.6*  < > 7.8* 7.7* 8.1* 7.6* 7.5*  HCT 26.1*  < > 27.6*  26.5* 27.9* 26.6* 25.5*  MCV 79.6  < > 81.2 80.5 79.9 82.1 79.9  PLT 85*  < > 113* 96* 110* 75* 79*  < > = values in this interval not displayed. Cardiac Enzymes: No results for input(s): CKTOTAL, CKMB, CKMBINDEX, TROPONINI in the last 168 hours. BNP: Invalid input(s): POCBNP CBG:  Recent Labs Lab 04/15/16 1101 04/15/16 1657 04/15/16 2111 04/16/16 0603 04/16/16 1130  GLUCAP 179* 141* 114* 125* 172*   HbA1C: No results for input(s): HGBA1C in the last 72 hours. Urine analysis:    Component Value Date/Time   COLORURINE AMBER (A) 04/03/2016 2007   APPEARANCEUR CLOUDY (A) 04/03/2016 2007   LABSPEC 1.022 04/03/2016 2007   PHURINE 5.0 04/03/2016 2007   GLUCOSEU NEGATIVE 04/03/2016 2007   HGBUR SMALL (A) 04/03/2016 2007   BILIRUBINUR NEGATIVE  04/03/2016 2007   [place] NEGATIVE 04/03/2016 2007   PROTEINUR 100 (A) 04/03/2016 2007   NITRITE NEGATIVE 04/03/2016 2007   LEUKOCYTESUR NEGATIVE 04/03/2016 2007   Sepsis Labs: @LABRCNTIP (procalcitonin:4,lacticidven:4) ) Recent Results (from the past 240 hour(s))  Surgical pcr screen     Status: None   Collection Time: 04/08/16  5:03 AM  Result Value Ref Range Status   MRSA, PCR NEGATIVE NEGATIVE Final   Staphylococcus aureus NEGATIVE NEGATIVE Final    Comment:        The Xpert SA Assay (FDA approved for NASAL specimens in patients over 58 years of age), is one component of a comprehensive surveillance program.  Test performance has been validated by [facility] for patients greater than or equal to 7 year old. It is not intended to diagnose infection nor to guide or monitor treatment.   Anaerobic culture     Status: None   Collection Time: 04/08/16 11:56 AM  Result Value Ref Range Status   Specimen Description FLUID LEFT PLEURAL  Final   Special Requests PATIENT ON FOLLOWING  ZOSYN AND VANCOMYCIN  Final   Culture NO ANAEROBES ISOLATED  Final   Report Status 04/12/2016 FINAL  Final  Body fluid culture     Status: None   Collection Time: 04/08/16 11:56 AM  Result Value Ref Range Status   Specimen Description FLUID LEFT PLEURAL  Final   Special Requests PATIENT ON FOLLOWING  ZOSYN AND VANCOMYCIN  Final   Gram Stain   Final    RARE WBC PRESENT, PREDOMINANTLY PMN NO ORGANISMS SEEN    Culture   Final    RARE STAPHYLOCOCCUS SPECIES (COAGULASE NEGATIVE) CRITICAL RESULT CALLED TO, READ BACK BY AND VERIFIED WITH: J JOYCE,RN AT 1635 04/11/16 BY L BENFIELD    Report Status 04/12/2016 FINAL  Final   Organism ID, Bacteria STAPHYLOCOCCUS SPECIES (COAGULASE NEGATIVE)  Final      Susceptibility   Staphylococcus species (coagulase negative) - MIC*    CIPROFLOXACIN <=0.5 SENSITIVE Sensitive     ERYTHROMYCIN <=0.25 SENSITIVE Sensitive     GENTAMICIN <=0.5 SENSITIVE Sensitive      OXACILLIN 0.5 SENSITIVE Sensitive     TETRACYCLINE <=1 SENSITIVE Sensitive     VANCOMYCIN <=0.5 SENSITIVE Sensitive     TRIMETH/SULFA <=10 SENSITIVE Sensitive     CLINDAMYCIN <=0.25 SENSITIVE Sensitive     RIFAMPIN <=0.5 SENSITIVE Sensitive     Inducible Clindamycin NEGATIVE Sensitive     * RARE STAPHYLOCOCCUS SPECIES (COAGULASE NEGATIVE)     Scheduled Meds: . [START ON 05/01/2016] cyanocobalamin  1,000 mcg Intramuscular Q30 days  . dextromethorphan  30 mg Oral QHS  . doxazosin  4 mg Oral  QHS  . eltrombopag  75 mg Oral QAC lunch  . ferrous sulfate  325 mg Oral QPC supper  . fluticasone  1 spray Each Nare QHS  . furosemide  40 mg Intravenous BID  . guaiFENesin  600 mg Oral BID  . insulin aspart  0-15 Units Subcutaneous TID WC  . insulin aspart  2 Units Subcutaneous TID WC  . insulin glargine  15 Units Subcutaneous Daily  . metoprolol succinate  100 mg Oral Daily  . multivitamin with minerals  1 tablet Oral QHS  . piperacillin-tazobactam (ZOSYN)  IV  3.375 g Intravenous Q8H  . potassium chloride  20 mEq Oral Daily  . sodium chloride flush  3 mL Intravenous Q12H   Continuous Infusions:   Procedures/Studies: Dg Chest 2 View  Result Date: 04/07/2016 CLINICAL DATA:  Left-sided chest tube, weakness, history of recurrent 9 malignant left pleural effusion. Also history of lymphoma EXAM: CHEST  2 VIEW COMPARISON:  Chest x-ray of 04/06/2016 and CT chest of 04/03/2016 FINDINGS: The left chest tube remains and no residual pneumothorax is seen. Apparently loculated left pleural effusion is unchanged. The pigtail of the catheter lies just superior to the superior extent of the apparently loculated effusion. Mild bibasilar atelectasis is present. Heart size is stable. IMPRESSION: 1. No pneumothorax is currently seen. 2. No change in left chest tube and apparently loculated left pleural effusion. Electronically Signed   By: Ivar Drape M.D.   On: 04/07/2016 13:23   Dg Chest 2 View  Result Date:  04/03/2016 CLINICAL DATA:  Sepsis.  Concern for empyema EXAM: CHEST  2 VIEW COMPARISON:  03/03/2016 FINDINGS: Loculated left pleural effusion, moderate size, thickest at the base. There is a tunneled pleural catheter at the left base with alignment that is stable from 04/02/2016. The underlying lung is partly atelectatic. Normal heart size. The lower aorta is obscured by a medial pleural disease. IMPRESSION: Moderate loculated left pleural effusion with tunneled pleural catheter. Appearance is stable compared to 04/02/2016 PET-CT. Electronically Signed   By: Monte Fantasia M.D.   On: 04/03/2016 20:58   Ct Chest W Contrast  Result Date: 04/03/2016 CLINICAL DATA:  82 year old male with fever. Patient with left pleural drainage catheter. History of lymphoma. EXAM: CT CHEST WITH CONTRAST TECHNIQUE: Multidetector CT imaging of the chest was performed during intravenous contrast administration. CONTRAST:  29mL ISOVUE-300 IOPAMIDOL (ISOVUE-300) INJECTION 61% COMPARISON:  04/02/2016 PET-CT and prior studies. FINDINGS: Cardiovascular: Upper limits normal heart size and moderate coronary artery calcifications noted. There is no evidence of thoracic aortic aneurysm. Mediastinum/Nodes: Multiple shotty and enlarged mediastinal lymph nodes are identified, the largest in the lower thoracic para-aortic region. These mediastinal lymph nodes for mildly hypermetabolic on recent PET study suspicious for recurrent lymphoma. There is no evidence of pericardial effusion. Lungs/Pleura: A moderate loculated left pleural effusion is again identified. A left pleural catheter with tip in the posterior-inferior left pleural space again noted. Left lower lung atelectasis identified. Mild interlobular septal thickening is noted within both lungs. No discrete mass identified. There is no evidence of pneumothorax or right pleural effusion. Upper Abdomen: No acute abnormality. Upper limits normal spleen size noted. Musculoskeletal: No acute  or suspicious abnormality. Degenerative disc disease in the lower cervical spine identified. IMPRESSION: Moderate loculated left pleural effusion, with left pleural catheter tip in the posterior inferior left pleural space. Associated left lower lung atelectasis. Mediastinal lymph nodes as described, mildly hypermetabolic on recent PET study suspicious for recurrent lymphoma. Coronary artery disease. Electronically Signed  By: Margarette Canada M.D.   On: 04/03/2016 21:47   Nm Pet Image Restag (ps) Skull Base To Thigh  Result Date: 04/02/2016 CLINICAL DATA:  Subsequent treatment strategy for lymphoma. EXAM: NUCLEAR MEDICINE PET SKULL BASE TO THIGH TECHNIQUE: 12.4 mCi F-18 FDG was injected intravenously. Full-ring PET imaging was performed from the skull base to thigh after the radiotracer. CT data was obtained and used for attenuation correction and anatomic localization. FASTING BLOOD GLUCOSE:  Value: 119 mg/dl COMPARISON:  10/30/2014 FINDINGS: NECK No hypermetabolic lymph nodes in the neck. CHEST There is a moderate-sized left loculated pleural effusion. No hypermetabolism to suggest a malignant effusion. It measures as simple fluid. New line small nodules are noted along the left major fissure. These are likely lymph nodes. Is also 3 subpleural nodules in the left upper on image number 104. No obvious hypermetabolism. SUV max is 1.7. These are suspicious for lymphomatous nodules however and they are new since the prior study. Enlarging mediastinal lymph nodes are mildly hypermetabolic and suspicious for recurrent lymphoma. 5 mm pretracheal lymph node on image number 87 has an SUV max of 2.56. Multiple small prevascular lymph nodes. The largest node measures 5 mm on image number 100 and SUV max is 2.46. 11 mm aorticopulmonary window lymph node on image number 94 has an SUV max of 3.5. Subcarinal lymph node on image number 109 measures 10 mm and has an SUV max of 3.5. Left-sided retrocrural lymph node measures 23 x  16 mm on image number 127 and has SUV max of 5.9. Right-sided periaortic lymph node on image number 121 measures 8.5 mm and SUV max is 4.1. ABDOMEN/PELVIS No abnormal hypermetabolic activity within the liver, pancreas, adrenal glands, or spleen. No hypermetabolic lymph nodes in the abdomen or pelvis. Stable advanced atherosclerotic calcifications involving the aorta and branch vessels. The right renal artery stent is noted. Brachytherapy seeds are noted the prostate gland. There is a large right inguinal hernia containing fat. SKELETON No focal hypermetabolic activity to suggest skeletal metastasis. IMPRESSION: 1. Enlarging hypermetabolic mediastinal lymph nodes suggesting recurrent lymphoma. 2. New nodules along the left major fissure and in the left upper lobe subpleural space worrisome for lymphomatous involvement. 3. Moderate-sized loculated left pleural effusion collection with a PleurX drainage catheter in place. 4. No findings for adenopathy in the neck, abdomen or pelvis. Electronically Signed   By: Marijo Sanes M.D.   On: 04/02/2016 14:52   Dg Chest Port 1 View  Result Date: 04/15/2016 CLINICAL DATA:  Chest 2 present. Shortness of breath. Pulmonary edema. EXAM: PORTABLE CHEST 1 VIEW COMPARISON:  1 day prior FINDINGS: Left-sided small bore chest tube is unchanged in position. Midline trachea. Cardiomegaly accentuated by AP portable technique. Apparent superior mediastinal soft tissue fullness is favored to be due to AP portable technique. Atherosclerosis in the transverse aorta. Loculated left-sided pleural effusion is small and similar given differences in technique. No pneumothorax. Interstitial edema is moderate, accentuated by diminished lung volumes today. Slight worsening left base aeration with persistent bibasilar airspace opacities. IMPRESSION: Small bore left-sided chest tube remaining in place with persistent loculated left-sided effusion. No pneumothorax. Diminished lung volumes. Persistent  interstitial edema with similar right and increased left base airspace disease. Electronically Signed   By: Abigail Miyamoto M.D.   On: 04/15/2016 07:51   Dg Chest Port 1 View  Result Date: 04/14/2016 CLINICAL DATA:  Left chest tube followup EXAM: PORTABLE CHEST 1 VIEW COMPARISON:  04/12/2016 FINDINGS: Left-sided chest tube is in place. No pneumothorax  identified. The left pleural effusion is not significantly changed in volume compared with previous exam. Moderate diffuse pulmonary edema is identified which appears increased in the interval. IMPRESSION: 1. Stable appearance of left chest tube. No change in volume of left effusion. 2. Increase in diffuse pulmonary edema pattern. Electronically Signed   By: Kerby Moors M.D.   On: 04/14/2016 08:52   Dg Chest Port 1 View  Result Date: 04/12/2016 CLINICAL DATA:  Followup left-sided pleural effusion. EXAM: PORTABLE CHEST 1 VIEW COMPARISON:  04/10/2016 FINDINGS: The left-sided pleural drainage catheter is stable. There is a persistent small to moderate-sized left pleural effusion with overlying atelectasis. Persistent central vascular congestion. IMPRESSION: Persistent left-sided pleural effusion and overlying atelectasis. Persistent central vascular congestion Electronically Signed   By: Marijo Sanes M.D.   On: 04/12/2016 09:13   Dg Chest Port 1 View  Result Date: 04/10/2016 CLINICAL DATA:  Followup for left chest tube. Left pleural effusion. EXAM: PORTABLE CHEST 1 VIEW COMPARISON:  04/09/2016 FINDINGS: Left-sided pigtail catheter is stable, projecting along the lateral left mid to upper hemi thorax. There is persistent left pleural fluid and left lung base parenchymal opacity. No new lung abnormalities. No pneumothorax. IMPRESSION: 1. No change from the previous day's study. 2. Stable left chest tube.  No pneumothorax. 3. Persistent left lung base opacity with an associated pleural effusion. Electronically Signed   By: Lajean Manes M.D.   On: 04/10/2016  07:51   Dg Chest Port 1 View  Result Date: 04/09/2016 CLINICAL DATA:  Soreness in chest.  Chest tube present. EXAM: PORTABLE CHEST 1 VIEW COMPARISON:  04/08/2016.  04/03/2016. FINDINGS: Again noted is a left basilar chest tube and a small bore pigtail chest tube in the lateral upper chest. There continues to be evidence for a loculated left pleural fluid which is caudal to the pigtail catheter. Evidence for compressive atelectasis at the left lung base. Slightly increased densities in the right lower lung. Heart size is stable. Trachea is midline. Negative for pneumothorax. IMPRESSION: No change in the loculated left pleural effusion. Stable position of the left chest tubes without a pneumothorax. Electronically Signed   By: Markus Daft M.D.   On: 04/09/2016 08:56   Dg Chest Port 1 View  Result Date: 04/08/2016 CLINICAL DATA:  Shortness of breath. EXAM: PORTABLE CHEST 1 VIEW COMPARISON:  04/08/2016.  04/07/2016. FINDINGS: PleurX catheter again noted on the left in stable position. No pneumothorax. Cardiomegaly with bilateral pulmonary interstitial prominence and left-sided pleural effusion noted consistent congestive heart failure. Similar finding noted on prior exam from earlier today. IMPRESSION: 1. Left chest tube noted in stable position. No pneumothorax. Stable left pleural effusion. 2. Persistent changes of congestive heart failure with pulmonary interstitial edema. No interim change from earlier exam today. Electronically Signed   By: Marcello Moores  Register   On: 04/08/2016 13:36   Dg Chest Portable 1 View  Result Date: 04/08/2016 CLINICAL DATA:  PleurX catheter placement. EXAM: PORTABLE CHEST 1 VIEW COMPARISON:  04/07/2016. FINDINGS: PleurX catheter on the left again noted. Small left pleural effusion again noted. No pneumothorax . Cardiomegaly with pulmonary vascular prominence and mild interstitial prominence noted. Mild component congestive heart failure cannot be excluded. IMPRESSION: 1. PleurX  catheter on the left again noted. No pneumothorax. Small left pleural effusion again noted. 2. Cannot exclude mild congestive heart failure with mild interstitial edema. Electronically Signed   By: Marcello Moores  Register   On: 04/08/2016 12:31   Dg Chest Port 1 View  Addendum Date: 04/07/2016  ADDENDUM REPORT: 04/07/2016 08:27 ADDENDUM: Critical Value/emergent results were called by telephone at the time of interpretation on 04/07/2016 at 8:27 am to the patient's interventional radiologist, who verbally acknowledged these results. Electronically Signed   By: Marcello Moores  Register   On: 04/07/2016 08:27   Result Date: 04/07/2016 CLINICAL DATA:  Shortness of breath. EXAM: PORTABLE CHEST 1 VIEW COMPARISON:  CT 04/05/2016. FINDINGS: Left chest tube noted. Previously identified large left left up pleural effusion is been removed. Residual left lower pleural effusion. Tiny left pneumothorax present. This was discussed with the patient's interventional radiologist. Stable cardiomegaly. No acute bony abnormality. IMPRESSION: Left chest tube noted in good anatomic position. Significant resolution of left-sided pneumothorax. Residual left lower pleural effusion present. Miniscule left pneumothorax noted. Electronically Signed: By: Marcello Moores  Register On: 04/06/2016 16:30   Dg Chest Port 1 View  Result Date: 04/05/2016 CLINICAL DATA:  Preprocedural evaluation for pleural drainage catheter. EXAM: PORTABLE CHEST 1 VIEW COMPARISON:  CT 04/03/2016.  Chest x-ray 04/03/2016 . FINDINGS: Progressive left pleural effusion noted. Some degree of loculation may be present. Mild cardiomegaly with mild interstitial prominence, a component of mild congestive heart failure cannot be excluded. No pneumothorax . IMPRESSION: 1. Progressive left pleural effusion with possible loculation. 2. Cardiomegaly with mild interstitial prominence. A component of mild congestive heart failure cannot be excluded . Electronically Signed   By: Marcello Moores  Register    On: 04/05/2016 08:01   Dg Abd Portable 2v  Result Date: 04/10/2016 CLINICAL DATA:  Abdominal pain EXAM: PORTABLE ABDOMEN - 2 VIEW COMPARISON:  None. FINDINGS: Scattered large and small bowel gas is noted. A left thoracostomy catheter is noted over the base. No obstructive changes are seen. No free air is noted. Mild degenerative changes of the lumbar spine are seen. IMPRESSION: No acute abnormality noted. Electronically Signed   By: Inez Catalina M.D.   On: 04/10/2016 08:58   Ct Image Guided Fluid Drain By Catheter  Result Date: 04/05/2016 CLINICAL DATA:  Lymphoma, and left-sided empyema with residual loculated left pleural fluid remaining. Request has been made to place a percutaneous pleural drainage catheter. EXAM: CT GUIDED LEFT THORACOSTOMY TUBE PLACEMENT FOR EMPYEMA ANESTHESIA/SEDATION: 1.0 mg IV Versed 25 mcg IV Fentanyl Total Moderate Sedation Time:  20 minutes The patient's level of consciousness and physiologic status were continuously monitored during the procedure by Radiology nursing. PROCEDURE: The procedure, risks, benefits, and alternatives were explained to the patient's wife. Questions regarding the procedure were encouraged and answered. The patient's wife understands and consents to the procedure. A time out was performed prior to initiating the procedure. The left upper chest wall was prepped with chlorhexidine in a sterile fashion, and a sterile drape was applied covering the operative field. A sterile gown and sterile gloves were used for the procedure. Local anesthesia was provided with 1% Lidocaine. CT was performed in a supine position. After localizing the most appropriate target for pleural drainage, an 18 gauge trocar needle was advanced under CT guidance into the left pleural space. Fluid was aspirated. A guidewire was then advanced through the needle and the needle removed. The percutaneous tract was dilated over the guidewire. A 12 French pigtail drainage catheter was then  advanced over the wire. After the catheter was formed, additional CT images were performed. The catheter was then connected to a [place] Pleur-Evac device. COMPLICATIONS: None FINDINGS: The largest loculation of residual left pleural fluid is in the lateral superior hemithorax. Aspiration yielded clear, yellowish fluid. Thorax. After pleural drain placement, there is  return of yellowish colored clear fluid. IMPRESSION: CT-guided left chest thoracostomy tube placement with placement of 12 French drain into the left pleural space. The site of loculated fluid in the superior and lateral aspect of the hemithorax was targeted for catheter placement. Electronically Signed   By: Aletta Edouard M.D.   On: 04/05/2016 15:54    Christerpher Clos, DO  [facility] Pager [phone]  If 7PM-7AM, please contact night-coverage [email or web address] Password [email or web address] 04/16/2016, 4:26 PM   LOS: 13 days

## 2016-04-17 ENCOUNTER — Inpatient Hospital Stay (HOSPITAL_COMMUNITY): Payer: Medicare Other

## 2016-04-17 DIAGNOSIS — D519 Vitamin B12 deficiency anemia, unspecified: Secondary | ICD-10-CM

## 2016-04-17 DIAGNOSIS — Z978 Presence of other specified devices: Secondary | ICD-10-CM

## 2016-04-17 DIAGNOSIS — D518 Other vitamin B12 deficiency anemias: Secondary | ICD-10-CM

## 2016-04-17 DIAGNOSIS — Z9689 Presence of other specified functional implants: Secondary | ICD-10-CM

## 2016-04-17 LAB — CBC
HCT: 26.5 % — ABNORMAL LOW (ref 39.0–52.0)
Hemoglobin: 7.7 g/dL — ABNORMAL LOW (ref 13.0–17.0)
MCH: 23.5 pg — AB (ref 26.0–34.0)
MCHC: 29.1 g/dL — ABNORMAL LOW (ref 30.0–36.0)
MCV: 81 fL (ref 78.0–100.0)
PLATELETS: 67 10*3/uL — AB (ref 150–400)
RBC: 3.27 MIL/uL — AB (ref 4.22–5.81)
RDW: 23.4 % — AB (ref 11.5–15.5)
WBC: 10.5 10*3/uL (ref 4.0–10.5)

## 2016-04-17 LAB — GLUCOSE, CAPILLARY
GLUCOSE-CAPILLARY: 164 mg/dL — AB (ref 65–99)
GLUCOSE-CAPILLARY: 164 mg/dL — AB (ref 65–99)
Glucose-Capillary: 110 mg/dL — ABNORMAL HIGH (ref 65–99)
Glucose-Capillary: 120 mg/dL — ABNORMAL HIGH (ref 65–99)
Glucose-Capillary: 204 mg/dL — ABNORMAL HIGH (ref 65–99)

## 2016-04-17 LAB — BASIC METABOLIC PANEL
Anion gap: 8 (ref 5–15)
BUN: 16 mg/dL (ref 6–20)
CALCIUM: 7.5 mg/dL — AB (ref 8.9–10.3)
CO2: 32 mmol/L (ref 22–32)
CREATININE: 1.95 mg/dL — AB (ref 0.61–1.24)
Chloride: 101 mmol/L (ref 101–111)
GFR calc non Af Amer: 30 mL/min — ABNORMAL LOW (ref >60)
GFR, EST AFRICAN AMERICAN: 35 mL/min — AB (ref >60)
Glucose, Bld: 130 mg/dL — ABNORMAL HIGH (ref 65–99)
Potassium: 3.5 mmol/L (ref 3.5–5.1)
SODIUM: 141 mmol/L (ref 135–145)

## 2016-04-17 LAB — MAGNESIUM: MAGNESIUM: 1.9 mg/dL (ref 1.7–2.4)

## 2016-04-17 MED ORDER — FUROSEMIDE 80 MG PO TABS
80.0000 mg | ORAL_TABLET | Freq: Once | ORAL | Status: AC
  Administered 2016-04-17: 80 mg via ORAL
  Filled 2016-04-17: qty 1

## 2016-04-17 MED ORDER — FUROSEMIDE 10 MG/ML IJ SOLN
40.0000 mg | Freq: Two times a day (BID) | INTRAMUSCULAR | Status: DC
  Administered 2016-04-18 – 2016-04-19 (×3): 40 mg via INTRAVENOUS
  Filled 2016-04-17 (×3): qty 4

## 2016-04-17 MED ORDER — IOPAMIDOL (ISOVUE-300) INJECTION 61%
INTRAVENOUS | Status: AC
  Filled 2016-04-17: qty 30

## 2016-04-17 NOTE — Progress Notes (Addendum)
      [address]       [place] [id number]             [phone]       9 Days Post-Op Procedure(s) (LRB): REMOVAL OF PLEURAL DRAINAGE CATHETER (Left) CHEST TUBE INSERTION (Left)  Subjective: Patient just waking up-no specific complaints.  Objective: Vital signs in last 24 hours: Temp:  [97.8 F (36.6 C)-98.2 F (36.8 C)] 98.2 F (36.8 C) (10/28 0529) Pulse Rate:  [85-98] 98 (10/28 0529) Cardiac Rhythm: Sinus tachycardia (10/28 0700) Resp:  [17-22] 22 (10/28 0529) BP: (136-147)/(65-85) 144/85 (10/28 0529) SpO2:  [96 %-98 %] 98 % (10/28 0529) Weight:  [190 lb 14.4 oz (86.6 kg)-191 lb 14.4 oz (87 kg)] 190 lb 14.4 oz (86.6 kg) (10/28 0537)     Intake/Output from previous day: 10/27 0701 - 10/28 0700 In: 17 [P.O.:880] Out: 2281 [Urine:2280; Stool:1]   Physical Exam:  Cardiovascular: IRRR Pulmonary: Clear to auscultation on right and slightly diminshed left base Extremities: SCDs in place Wounds: Clean and dry.  No erythema or signs of infection. Chest Tube: to water seal,serous drainage  Lab Results: CBC: Recent Labs  04/16/16 0147 04/17/16 0343  WBC 13.4* 10.5  HGB 7.5* 7.7*  HCT 25.5* 26.5*  PLT 79* 67*   BMET:  Recent Labs  04/16/16 0147 04/17/16 0343  NA 140 141  K 3.4* 3.5  CL 103 101  CO2 32 32  GLUCOSE 117* 130*  BUN 18 16  CREATININE 1.99* 1.95*  CALCIUM 7.7* 7.5*    PT/INR: No results for input(s): LABPROT, INR in the last 72 hours. ABG:  INR: Will add last result for INR, ABG once components are confirmed Will add last 4 CBG results once components are confirmed  Assessment/Plan:  1. CV -  On Toprol XL 100 mg daily 2.  Pulmonary - On 2 liters via [place]. Small left chest tube to Pleura Vac. ? Output last 24 hours. 3. Anemia-H and H stable at 7.7 and 26.5. On ferrous sulfate. 4. MDS-on Promacta.Thrombocytopenia-platelets 67,000 5. Acute on chronic renal failures (CKD stage III)-Creatinine slightly decreased from  1.99 to 1.95 6. ID-on Augmentin for coag neg staph ZIMMERMAN,DONIELLE MPA-C 04/17/2016,7:23 AM  Leave chest tube in until have reliable recoding of output  I have seen and examined [employer] and agree with the above assessment  and plan.  Grace Isaac MD Beeper [phone] Office [phone] 04/17/2016 10:36 AM

## 2016-04-17 NOTE — Progress Notes (Signed)
PROGRESS NOTE  Ricardo Watkins SNK:[id number] DOB: December 05, 1930 DOA: 04/03/2016 PCP: [name], MD Brief History: 82 y.o.malewith medical history significant of non-Hodgkin's lymphoma, CAD, type 2 diabetes, hyperlipidemia, hypertension, anemia, prostate CA, diverticulosis, cataracts, DVT, chronic cough, chronic left pleural effusion with insertion of left pleural catheter on 11/10/2015 by [name] who comes to the hospital due to fever, chills, worsening fatigue and malaise. Per patient, his daughter and wife, his chest tube drainage has been decreasing his output for the pastseveral days and he stopped completely the day PTA. He normally has up to 500 mL of drainage every other day. Patient was worked up and found to be septic and have a loculated effusion. TCV following patient, he went to OR on 10/19 and had the non functioning catheter removed and placed a new pigtail. Patient had a second chest tube placed in an adjacent collection on 10/20 which was removed on 10/22, currently with only one chest tube in place with minimal drainage. Initial drainage cultures positive for coag negative staph sensitive to penicillins, on Zosyn.  Assessment/Plan: Sepsis due to loculated Effusion/Empyema from Left Obstructed Pleural Catheter for recurrent and chronic Non Malignant Pleural Effusions with hypoxic respiratory failure - CT Scan on admission showed Moderate-sized loculated left pleural effusion collection with a PleurX drainage catheter in place.Patient was placed on broad-spectrum antibiotics of vancomycin and Zosyn  - Sepsis physiology resolved; Old left PleurX catheter removed by TCTS10/19. Vancomycin was discontinued on 10/22, MRSAPCR negative. - IR placed CT Guided Fluid Drain by PigTail Catheter 10/16 and drained ~550 mL. Patient taken to OR 10/19 with removal of old catheter and placement of a left sided chest tube  - post op had significant leukocytosis, up to 25 >> 23.9  >>17.3>>15.5>>19.9>>15.2>>13.4>>10.5 - removed one of thechest tubes10/22 per TCTS - Fluid collected on 10/19 during surgery grew coagulase-negative staph which is pansensitive.  -10/27--d/c zosyn, start augmentin  Acute respiratory failure with hypoxia -secondary to empyema and CHF -04/14/16 CXR--increase pulm vasc congestion -04/15/16 CXR personally reviewd--unchanged interstitial edema -stable on 2 L   Acute diastolic CHF -Daily weights -04/17/16-pt lost IV access--did not get any IV lasix-->lasix 80 mg po now, place PICC -NEG 2 L IV lasix -10/27--remains clinically fluid overloaded -10/27--increase lasix to 40 mg IV bid -Daily BMP -Echocardiogram--EF 50-55%, grade 2 DD, HK inferior wall, mild MR, AS -continue metoprolol succinate  Acute on chronic Renal failureCKD III - baseline Cr 1.3 - 1.7 - With increase in creatinine and his vancomycin was discontinued and his Avapro was also stopped.  -am BMP -pt likely will have new baseline  Hx of Non-Hodgkins Lymphoma - PET-CT Scan showed Enlarging hypermetabolic mediastinal lymph nodes suggesting recurrent lymphoma. - Patient was seen by Hematologist/Oncologist [name],  -will have close outpatient follow up  - Continue scheduled follow-ups with Hematology/Oncology after empyema treatment.  Abdominal pain - patient withintermittent crampyabdominal pain, states that he occasionally  -Present for the past 3-4 months -suspect component of IBS -Tolerating diet - plain films normal - CT abd/pelvis   MDS (myelodysplastic syndrome) with 5q deletion - Followed by Hematology/Oncology; Seen by [name] today - Continue Promacta.Patient's Hematologist increased it to 75 mg and will reassess next visit.  Hypokalemia - Improved, replete prn  Insulin Dependent Type 2 Diabetes Mellitus  - Continue Lantus and SSI,  -addednovolog 2 units tiw -overall controlled  CAD  - Continue Metoprolol Succinate 100 mg by  mouth daily -Irbesartan was  held due to renal function  - Clopidogrel was heldby Hematology/Oncology because of Thrombocytopenia.  Essential Hypertension - Continue Metoprolol succinate100 mg po daily -expect continued improvement with diuresis  Thrombocytopenia  - Platelet Count stable/improving - SCDs for DVT prophylaxis. - Continue Promacta. Patient's Hematologist increased it to 75 mg and will reassess next visit. - Hematology recommended stopping Clopidogrel; P2Y12 from 10/15 WNL   NormcyticAnemia - Continue Ferrous Sulfate 325 mg tablet po  -indices suggest anemia of chronic disease -baseline Hgb ~8   DVT prophylaxis:SCD Code Status:Full Family Communication:wife and daughter updatedbedside 04/17/16--Total time spent 35 minutes.  Greater than 50% spent face to face counseling and coordinating care.    Disposition Plan: Home in 2-3 days if cleared by TCTS  Consultants:  Cardiothoracic surgery  IR  HemOnc  Procedures:  CT Guided IR Pigtail Cathether Drain  Antimicrobials:  Vancomycin 10/14 >> 10/22  Zosyn 10/14 >>04/16/16  Augmentin 04/16/16>>>     Subjective: Patient complains of abdominal pain unchanged. Denies any fevers, chills, chest pain, nausea, vomiting, diarrhea. No dysuria or hematuria.  Objective: Vitals:   04/16/16 2127 04/17/16 0529 04/17/16 0537 04/17/16 1540  BP: (!) 147/70 (!) 144/85  (!) 150/66  Pulse: 85 98    Resp: 18 (!) 22  18  Temp: 97.9 F (36.6 C) 98.2 F (36.8 C)  97.5 F (36.4 C)  TempSrc: Oral Oral  Oral  SpO2: 97% 98%  100%  Weight:  87 kg (191 lb 14.4 oz) 86.6 kg (190 lb 14.4 oz)   Height:        Intake/Output Summary (Last 24 hours) at 04/17/16 1715 Last data filed at 04/17/16 1200  Gross per 24 hour  Intake             1200 ml  Output             1661 ml  Net             -461 ml   Weight change: -2.994 kg (-6 lb 9.6 oz) Exam:   General:  Pt is alert, follows commands  appropriately, not in acute distress  HEENT: No icterus, No thrush, No neck mass, [place]/AT  Cardiovascular: RRR, S1/S2, no rubs, no gallops  Respiratory: Basilar crackles but no wheezing. Good air movement.  Abdomen: Soft/+BS, epigastric tender, non distended, no guarding  Extremities: 1+LE edema, No lymphangitis, No petechiae, No rashes, no synovitis   Data Reviewed: I have personally reviewed following labs and imaging studies Basic Metabolic Panel:  Recent Labs Lab 04/13/16 0224 04/14/16 0243 04/15/16 0149 04/16/16 0147 04/17/16 0343  NA 140 142 141 140 141  K 3.6 3.7 3.7 3.4* 3.5  CL 104 106 103 103 101  CO2 28 29 30  32 32  GLUCOSE 105* 116* 104* 117* 130*  BUN 21* 20 19 18 16   CREATININE 2.11* 2.02* 2.00* 1.99* 1.95*  CALCIUM 7.8* 8.0* 7.8* 7.7* 7.5*  MG  --   --   --   --  1.9   Liver Function Tests: No results for input(s): AST, ALT, ALKPHOS, BILITOT, PROT, ALBUMIN in the last 168 hours. No results for input(s): LIPASE, AMYLASE in the last 168 hours. No results for input(s): AMMONIA in the last 168 hours. Coagulation Profile: No results for input(s): INR, PROTIME in the last 168 hours. CBC:  Recent Labs Lab 04/13/16 0224 04/14/16 0243 04/15/16 1422 04/16/16 0147 04/17/16 0343  WBC 15.5* 19.8* 15.2* 13.4* 10.5  HGB 7.7* 8.1* 7.6* 7.5* 7.7*  HCT 26.5* 27.9*  26.6* 25.5* 26.5*  MCV 80.5 79.9 82.1 79.9 81.0  PLT 96* 110* 75* 79* 67*   Cardiac Enzymes: No results for input(s): CKTOTAL, CKMB, CKMBINDEX, TROPONINI in the last 168 hours. BNP: Invalid input(s): POCBNP CBG:  Recent Labs Lab 04/16/16 2121 04/17/16 0619 04/17/16 1156 04/17/16 1159 04/17/16 1618  GLUCAP 75 110* 164* 164* 120*   HbA1C: No results for input(s): HGBA1C in the last 72 hours. Urine analysis:    Component Value Date/Time   COLORURINE AMBER (A) 04/03/2016 2007   APPEARANCEUR CLOUDY (A) 04/03/2016 2007   LABSPEC 1.022 04/03/2016 2007   PHURINE 5.0 04/03/2016 2007    GLUCOSEU NEGATIVE 04/03/2016 2007   HGBUR SMALL (A) 04/03/2016 2007   BILIRUBINUR NEGATIVE 04/03/2016 2007   [place] NEGATIVE 04/03/2016 2007   PROTEINUR 100 (A) 04/03/2016 2007   NITRITE NEGATIVE 04/03/2016 2007   LEUKOCYTESUR NEGATIVE 04/03/2016 2007   Sepsis Labs: @LABRCNTIP (procalcitonin:4,lacticidven:4) ) Recent Results (from the past 240 hour(s))  Surgical pcr screen     Status: None   Collection Time: 04/08/16  5:03 AM  Result Value Ref Range Status   MRSA, PCR NEGATIVE NEGATIVE Final   Staphylococcus aureus NEGATIVE NEGATIVE Final    Comment:        The Xpert SA Assay (FDA approved for NASAL specimens in patients over 12 years of age), is one component of a comprehensive surveillance program.  Test performance has been validated by [facility] for patients greater than or equal to 17 year old. It is not intended to diagnose infection nor to guide or monitor treatment.   Anaerobic culture     Status: None   Collection Time: 04/08/16 11:56 AM  Result Value Ref Range Status   Specimen Description FLUID LEFT PLEURAL  Final   Special Requests PATIENT ON FOLLOWING  ZOSYN AND VANCOMYCIN  Final   Culture NO ANAEROBES ISOLATED  Final   Report Status 04/12/2016 FINAL  Final  Body fluid culture     Status: None   Collection Time: 04/08/16 11:56 AM  Result Value Ref Range Status   Specimen Description FLUID LEFT PLEURAL  Final   Special Requests PATIENT ON FOLLOWING  ZOSYN AND VANCOMYCIN  Final   Gram Stain   Final    RARE WBC PRESENT, PREDOMINANTLY PMN NO ORGANISMS SEEN    Culture   Final    RARE STAPHYLOCOCCUS SPECIES (COAGULASE NEGATIVE) CRITICAL RESULT CALLED TO, READ BACK BY AND VERIFIED WITH: J JOYCE,RN AT 1635 04/11/16 BY L BENFIELD    Report Status 04/12/2016 FINAL  Final   Organism ID, Bacteria STAPHYLOCOCCUS SPECIES (COAGULASE NEGATIVE)  Final      Susceptibility   Staphylococcus species (coagulase negative) - MIC*    CIPROFLOXACIN <=0.5 SENSITIVE  Sensitive     ERYTHROMYCIN <=0.25 SENSITIVE Sensitive     GENTAMICIN <=0.5 SENSITIVE Sensitive     OXACILLIN 0.5 SENSITIVE Sensitive     TETRACYCLINE <=1 SENSITIVE Sensitive     VANCOMYCIN <=0.5 SENSITIVE Sensitive     TRIMETH/SULFA <=10 SENSITIVE Sensitive     CLINDAMYCIN <=0.25 SENSITIVE Sensitive     RIFAMPIN <=0.5 SENSITIVE Sensitive     Inducible Clindamycin NEGATIVE Sensitive     * RARE STAPHYLOCOCCUS SPECIES (COAGULASE NEGATIVE)     Scheduled Meds: . amoxicillin-clavulanate  1 tablet Oral Q12H  . [START ON 05/01/2016] cyanocobalamin  1,000 mcg Intramuscular Q30 days  . dextromethorphan  30 mg Oral QHS  . doxazosin  4 mg Oral QHS  . eltrombopag  75 mg Oral QAC  lunch  . ferrous sulfate  325 mg Oral QPC supper  . fluticasone  1 spray Each Nare QHS  . [START ON 04/18/2016] furosemide  40 mg Intravenous BID  . furosemide  80 mg Oral Once  . guaiFENesin  600 mg Oral BID  . insulin aspart  0-15 Units Subcutaneous TID WC  . insulin aspart  2 Units Subcutaneous TID WC  . insulin glargine  15 Units Subcutaneous Daily  . metoprolol succinate  100 mg Oral Daily  . multivitamin with minerals  1 tablet Oral QHS  . potassium chloride  20 mEq Oral Daily  . sodium chloride flush  3 mL Intravenous Q12H   Continuous Infusions:   Procedures/Studies: Dg Chest 2 View  Result Date: 04/07/2016 CLINICAL DATA:  Left-sided chest tube, weakness, history of recurrent 9 malignant left pleural effusion. Also history of lymphoma EXAM: CHEST  2 VIEW COMPARISON:  Chest x-ray of 04/06/2016 and CT chest of 04/03/2016 FINDINGS: The left chest tube remains and no residual pneumothorax is seen. Apparently loculated left pleural effusion is unchanged. The pigtail of the catheter lies just superior to the superior extent of the apparently loculated effusion. Mild bibasilar atelectasis is present. Heart size is stable. IMPRESSION: 1. No pneumothorax is currently seen. 2. No change in left chest tube and  apparently loculated left pleural effusion. Electronically Signed   By: Ivar Drape M.D.   On: 04/07/2016 13:23   Dg Chest 2 View  Result Date: 04/03/2016 CLINICAL DATA:  Sepsis.  Concern for empyema EXAM: CHEST  2 VIEW COMPARISON:  03/03/2016 FINDINGS: Loculated left pleural effusion, moderate size, thickest at the base. There is a tunneled pleural catheter at the left base with alignment that is stable from 04/02/2016. The underlying lung is partly atelectatic. Normal heart size. The lower aorta is obscured by a medial pleural disease. IMPRESSION: Moderate loculated left pleural effusion with tunneled pleural catheter. Appearance is stable compared to 04/02/2016 PET-CT. Electronically Signed   By: Monte Fantasia M.D.   On: 04/03/2016 20:58   Ct Chest W Contrast  Result Date: 04/03/2016 CLINICAL DATA:  82 year old male with fever. Patient with left pleural drainage catheter. History of lymphoma. EXAM: CT CHEST WITH CONTRAST TECHNIQUE: Multidetector CT imaging of the chest was performed during intravenous contrast administration. CONTRAST:  52mL ISOVUE-300 IOPAMIDOL (ISOVUE-300) INJECTION 61% COMPARISON:  04/02/2016 PET-CT and prior studies. FINDINGS: Cardiovascular: Upper limits normal heart size and moderate coronary artery calcifications noted. There is no evidence of thoracic aortic aneurysm. Mediastinum/Nodes: Multiple shotty and enlarged mediastinal lymph nodes are identified, the largest in the lower thoracic para-aortic region. These mediastinal lymph nodes for mildly hypermetabolic on recent PET study suspicious for recurrent lymphoma. There is no evidence of pericardial effusion. Lungs/Pleura: A moderate loculated left pleural effusion is again identified. A left pleural catheter with tip in the posterior-inferior left pleural space again noted. Left lower lung atelectasis identified. Mild interlobular septal thickening is noted within both lungs. No discrete mass identified. There is no  evidence of pneumothorax or right pleural effusion. Upper Abdomen: No acute abnormality. Upper limits normal spleen size noted. Musculoskeletal: No acute or suspicious abnormality. Degenerative disc disease in the lower cervical spine identified. IMPRESSION: Moderate loculated left pleural effusion, with left pleural catheter tip in the posterior inferior left pleural space. Associated left lower lung atelectasis. Mediastinal lymph nodes as described, mildly hypermetabolic on recent PET study suspicious for recurrent lymphoma. Coronary artery disease. Electronically Signed   By: Margarette Canada M.D.   On:  04/03/2016 21:47   Nm Pet Image Restag (ps) Skull Base To Thigh  Result Date: 04/02/2016 CLINICAL DATA:  Subsequent treatment strategy for lymphoma. EXAM: NUCLEAR MEDICINE PET SKULL BASE TO THIGH TECHNIQUE: 12.4 mCi F-18 FDG was injected intravenously. Full-ring PET imaging was performed from the skull base to thigh after the radiotracer. CT data was obtained and used for attenuation correction and anatomic localization. FASTING BLOOD GLUCOSE:  Value: 119 mg/dl COMPARISON:  10/30/2014 FINDINGS: NECK No hypermetabolic lymph nodes in the neck. CHEST There is a moderate-sized left loculated pleural effusion. No hypermetabolism to suggest a malignant effusion. It measures as simple fluid. New line small nodules are noted along the left major fissure. These are likely lymph nodes. Is also 3 subpleural nodules in the left upper on image number 104. No obvious hypermetabolism. SUV max is 1.7. These are suspicious for lymphomatous nodules however and they are new since the prior study. Enlarging mediastinal lymph nodes are mildly hypermetabolic and suspicious for recurrent lymphoma. 5 mm pretracheal lymph node on image number 87 has an SUV max of 2.56. Multiple small prevascular lymph nodes. The largest node measures 5 mm on image number 100 and SUV max is 2.46. 11 mm aorticopulmonary window lymph node on image number 94  has an SUV max of 3.5. Subcarinal lymph node on image number 109 measures 10 mm and has an SUV max of 3.5. Left-sided retrocrural lymph node measures 23 x 16 mm on image number 127 and has SUV max of 5.9. Right-sided periaortic lymph node on image number 121 measures 8.5 mm and SUV max is 4.1. ABDOMEN/PELVIS No abnormal hypermetabolic activity within the liver, pancreas, adrenal glands, or spleen. No hypermetabolic lymph nodes in the abdomen or pelvis. Stable advanced atherosclerotic calcifications involving the aorta and branch vessels. The right renal artery stent is noted. Brachytherapy seeds are noted the prostate gland. There is a large right inguinal hernia containing fat. SKELETON No focal hypermetabolic activity to suggest skeletal metastasis. IMPRESSION: 1. Enlarging hypermetabolic mediastinal lymph nodes suggesting recurrent lymphoma. 2. New nodules along the left major fissure and in the left upper lobe subpleural space worrisome for lymphomatous involvement. 3. Moderate-sized loculated left pleural effusion collection with a PleurX drainage catheter in place. 4. No findings for adenopathy in the neck, abdomen or pelvis. Electronically Signed   By: Marijo Sanes M.D.   On: 04/02/2016 14:52   Dg Chest Port 1 View  Result Date: 04/15/2016 CLINICAL DATA:  Chest 2 present. Shortness of breath. Pulmonary edema. EXAM: PORTABLE CHEST 1 VIEW COMPARISON:  1 day prior FINDINGS: Left-sided small bore chest tube is unchanged in position. Midline trachea. Cardiomegaly accentuated by AP portable technique. Apparent superior mediastinal soft tissue fullness is favored to be due to AP portable technique. Atherosclerosis in the transverse aorta. Loculated left-sided pleural effusion is small and similar given differences in technique. No pneumothorax. Interstitial edema is moderate, accentuated by diminished lung volumes today. Slight worsening left base aeration with persistent bibasilar airspace opacities.  IMPRESSION: Small bore left-sided chest tube remaining in place with persistent loculated left-sided effusion. No pneumothorax. Diminished lung volumes. Persistent interstitial edema with similar right and increased left base airspace disease. Electronically Signed   By: Abigail Miyamoto M.D.   On: 04/15/2016 07:51   Dg Chest Port 1 View  Result Date: 04/14/2016 CLINICAL DATA:  Left chest tube followup EXAM: PORTABLE CHEST 1 VIEW COMPARISON:  04/12/2016 FINDINGS: Left-sided chest tube is in place. No pneumothorax identified. The left pleural effusion is not significantly  changed in volume compared with previous exam. Moderate diffuse pulmonary edema is identified which appears increased in the interval. IMPRESSION: 1. Stable appearance of left chest tube. No change in volume of left effusion. 2. Increase in diffuse pulmonary edema pattern. Electronically Signed   By: Kerby Moors M.D.   On: 04/14/2016 08:52   Dg Chest Port 1 View  Result Date: 04/12/2016 CLINICAL DATA:  Followup left-sided pleural effusion. EXAM: PORTABLE CHEST 1 VIEW COMPARISON:  04/10/2016 FINDINGS: The left-sided pleural drainage catheter is stable. There is a persistent small to moderate-sized left pleural effusion with overlying atelectasis. Persistent central vascular congestion. IMPRESSION: Persistent left-sided pleural effusion and overlying atelectasis. Persistent central vascular congestion Electronically Signed   By: Marijo Sanes M.D.   On: 04/12/2016 09:13   Dg Chest Port 1 View  Result Date: 04/10/2016 CLINICAL DATA:  Followup for left chest tube. Left pleural effusion. EXAM: PORTABLE CHEST 1 VIEW COMPARISON:  04/09/2016 FINDINGS: Left-sided pigtail catheter is stable, projecting along the lateral left mid to upper hemi thorax. There is persistent left pleural fluid and left lung base parenchymal opacity. No new lung abnormalities. No pneumothorax. IMPRESSION: 1. No change from the previous day's study. 2. Stable left  chest tube.  No pneumothorax. 3. Persistent left lung base opacity with an associated pleural effusion. Electronically Signed   By: Lajean Manes M.D.   On: 04/10/2016 07:51   Dg Chest Port 1 View  Result Date: 04/09/2016 CLINICAL DATA:  Soreness in chest.  Chest tube present. EXAM: PORTABLE CHEST 1 VIEW COMPARISON:  04/08/2016.  04/03/2016. FINDINGS: Again noted is a left basilar chest tube and a small bore pigtail chest tube in the lateral upper chest. There continues to be evidence for a loculated left pleural fluid which is caudal to the pigtail catheter. Evidence for compressive atelectasis at the left lung base. Slightly increased densities in the right lower lung. Heart size is stable. Trachea is midline. Negative for pneumothorax. IMPRESSION: No change in the loculated left pleural effusion. Stable position of the left chest tubes without a pneumothorax. Electronically Signed   By: Markus Daft M.D.   On: 04/09/2016 08:56   Dg Chest Port 1 View  Result Date: 04/08/2016 CLINICAL DATA:  Shortness of breath. EXAM: PORTABLE CHEST 1 VIEW COMPARISON:  04/08/2016.  04/07/2016. FINDINGS: PleurX catheter again noted on the left in stable position. No pneumothorax. Cardiomegaly with bilateral pulmonary interstitial prominence and left-sided pleural effusion noted consistent congestive heart failure. Similar finding noted on prior exam from earlier today. IMPRESSION: 1. Left chest tube noted in stable position. No pneumothorax. Stable left pleural effusion. 2. Persistent changes of congestive heart failure with pulmonary interstitial edema. No interim change from earlier exam today. Electronically Signed   By: Marcello Moores  Register   On: 04/08/2016 13:36   Dg Chest Portable 1 View  Result Date: 04/08/2016 CLINICAL DATA:  PleurX catheter placement. EXAM: PORTABLE CHEST 1 VIEW COMPARISON:  04/07/2016. FINDINGS: PleurX catheter on the left again noted. Small left pleural effusion again noted. No pneumothorax .  Cardiomegaly with pulmonary vascular prominence and mild interstitial prominence noted. Mild component congestive heart failure cannot be excluded. IMPRESSION: 1. PleurX catheter on the left again noted. No pneumothorax. Small left pleural effusion again noted. 2. Cannot exclude mild congestive heart failure with mild interstitial edema. Electronically Signed   By: Marcello Moores  Register   On: 04/08/2016 12:31   Dg Chest Port 1 View  Addendum Date: 04/07/2016   ADDENDUM REPORT: 04/07/2016 08:27 ADDENDUM: Critical  Value/emergent results were called by telephone at the time of interpretation on 04/07/2016 at 8:27 am to the patient's interventional radiologist, who verbally acknowledged these results. Electronically Signed   By: Marcello Moores  Register   On: 04/07/2016 08:27   Result Date: 04/07/2016 CLINICAL DATA:  Shortness of breath. EXAM: PORTABLE CHEST 1 VIEW COMPARISON:  CT 04/05/2016. FINDINGS: Left chest tube noted. Previously identified large left left up pleural effusion is been removed. Residual left lower pleural effusion. Tiny left pneumothorax present. This was discussed with the patient's interventional radiologist. Stable cardiomegaly. No acute bony abnormality. IMPRESSION: Left chest tube noted in good anatomic position. Significant resolution of left-sided pneumothorax. Residual left lower pleural effusion present. Miniscule left pneumothorax noted. Electronically Signed: By: Marcello Moores  Register On: 04/06/2016 16:30   Dg Chest Port 1 View  Result Date: 04/05/2016 CLINICAL DATA:  Preprocedural evaluation for pleural drainage catheter. EXAM: PORTABLE CHEST 1 VIEW COMPARISON:  CT 04/03/2016.  Chest x-ray 04/03/2016 . FINDINGS: Progressive left pleural effusion noted. Some degree of loculation may be present. Mild cardiomegaly with mild interstitial prominence, a component of mild congestive heart failure cannot be excluded. No pneumothorax . IMPRESSION: 1. Progressive left pleural effusion with possible  loculation. 2. Cardiomegaly with mild interstitial prominence. A component of mild congestive heart failure cannot be excluded . Electronically Signed   By: Marcello Moores  Register   On: 04/05/2016 08:01   Dg Abd Portable 2v  Result Date: 04/10/2016 CLINICAL DATA:  Abdominal pain EXAM: PORTABLE ABDOMEN - 2 VIEW COMPARISON:  None. FINDINGS: Scattered large and small bowel gas is noted. A left thoracostomy catheter is noted over the base. No obstructive changes are seen. No free air is noted. Mild degenerative changes of the lumbar spine are seen. IMPRESSION: No acute abnormality noted. Electronically Signed   By: Inez Catalina M.D.   On: 04/10/2016 08:58   Ct Image Guided Fluid Drain By Catheter  Result Date: 04/05/2016 CLINICAL DATA:  Lymphoma, and left-sided empyema with residual loculated left pleural fluid remaining. Request has been made to place a percutaneous pleural drainage catheter. EXAM: CT GUIDED LEFT THORACOSTOMY TUBE PLACEMENT FOR EMPYEMA ANESTHESIA/SEDATION: 1.0 mg IV Versed 25 mcg IV Fentanyl Total Moderate Sedation Time:  20 minutes The patient's level of consciousness and physiologic status were continuously monitored during the procedure by Radiology nursing. PROCEDURE: The procedure, risks, benefits, and alternatives were explained to the patient's wife. Questions regarding the procedure were encouraged and answered. The patient's wife understands and consents to the procedure. A time out was performed prior to initiating the procedure. The left upper chest wall was prepped with chlorhexidine in a sterile fashion, and a sterile drape was applied covering the operative field. A sterile gown and sterile gloves were used for the procedure. Local anesthesia was provided with 1% Lidocaine. CT was performed in a supine position. After localizing the most appropriate target for pleural drainage, an 18 gauge trocar needle was advanced under CT guidance into the left pleural space. Fluid was aspirated. A  guidewire was then advanced through the needle and the needle removed. The percutaneous tract was dilated over the guidewire. A 12 French pigtail drainage catheter was then advanced over the wire. After the catheter was formed, additional CT images were performed. The catheter was then connected to a [place] Pleur-Evac device. COMPLICATIONS: None FINDINGS: The largest loculation of residual left pleural fluid is in the lateral superior hemithorax. Aspiration yielded clear, yellowish fluid. Thorax. After pleural drain placement, there is return of yellowish colored clear fluid.  IMPRESSION: CT-guided left chest thoracostomy tube placement with placement of 12 French drain into the left pleural space. The site of loculated fluid in the superior and lateral aspect of the hemithorax was targeted for catheter placement. Electronically Signed   By: Aletta Edouard M.D.   On: 04/05/2016 15:54    Arshi Duarte, DO  [facility] Pager [phone]  If 7PM-7AM, please contact night-coverage [email or web address] Password [email or web address] 04/17/2016, 5:15 PM   LOS: 14 days

## 2016-04-17 NOTE — Progress Notes (Signed)
Called Dr. Servando Snare to make aware of poor drainage from left chest tube. Tubing appears partially occluded with small amount of fluid passing through.  Dr. Servando Snare will address tomorrow. Pt resting with call bell within reach.  Will continue to monitor.

## 2016-04-18 ENCOUNTER — Inpatient Hospital Stay (HOSPITAL_COMMUNITY): Payer: Medicare Other

## 2016-04-18 LAB — CBC
HCT: 25.4 % — ABNORMAL LOW (ref 39.0–52.0)
HEMOGLOBIN: 7.3 g/dL — AB (ref 13.0–17.0)
MCH: 22.9 pg — AB (ref 26.0–34.0)
MCHC: 28.7 g/dL — AB (ref 30.0–36.0)
MCV: 79.6 fL (ref 78.0–100.0)
Platelets: 63 10*3/uL — ABNORMAL LOW (ref 150–400)
RBC: 3.19 MIL/uL — ABNORMAL LOW (ref 4.22–5.81)
RDW: 23.1 % — AB (ref 11.5–15.5)
WBC: 10.4 10*3/uL (ref 4.0–10.5)

## 2016-04-18 LAB — BASIC METABOLIC PANEL
ANION GAP: 7 (ref 5–15)
BUN: 16 mg/dL (ref 6–20)
CALCIUM: 7.7 mg/dL — AB (ref 8.9–10.3)
CO2: 31 mmol/L (ref 22–32)
CREATININE: 1.85 mg/dL — AB (ref 0.61–1.24)
Chloride: 100 mmol/L — ABNORMAL LOW (ref 101–111)
GFR calc Af Amer: 37 mL/min — ABNORMAL LOW (ref >60)
GFR, EST NON AFRICAN AMERICAN: 32 mL/min — AB (ref >60)
GLUCOSE: 121 mg/dL — AB (ref 65–99)
Potassium: 3.6 mmol/L (ref 3.5–5.1)
Sodium: 138 mmol/L (ref 135–145)

## 2016-04-18 LAB — GLUCOSE, CAPILLARY
GLUCOSE-CAPILLARY: 111 mg/dL — AB (ref 65–99)
Glucose-Capillary: 185 mg/dL — ABNORMAL HIGH (ref 65–99)
Glucose-Capillary: 205 mg/dL — ABNORMAL HIGH (ref 65–99)
Glucose-Capillary: 137 mg/dL — ABNORMAL HIGH (ref 65–99)

## 2016-04-18 NOTE — Progress Notes (Addendum)
PROGRESS NOTE  Ricardo Watkins QPR:[id number] DOB: Mar 01, 1931 DOA: 04/03/2016 PCP: [name], MD  Brief History: 82 y.o.malewith medical history significant of non-Hodgkin's lymphoma, CAD, type 2 diabetes, hyperlipidemia, hypertension, anemia, prostate CA, diverticulosis, cataracts, DVT, chronic cough, chronic left pleural effusion with insertion of left pleural catheter on 11/10/2015 by [name] who comes to the hospital due to fever, chills, worsening fatigue and malaise. Per patient, his daughter and wife, his chest tube drainage has been decreasing his output for the pastseveral days and he stopped completely the day PTA. He normally has up to 500 mL of drainage every other day. Patient was worked up and found to be septic and have a loculated effusion. TCTS following patient.  pt went to OR on 10/19 and had the non functioning PleurX catheter removed and placed a new pigtail. Patient had a second chest tube placed in an adjacent collection on 10/20 which was removed on 10/22, currently with only one chest tube in place with minimal drainage. Initial drainage cultures positive for coag negative staph sensitive to penicillins, on Zosyn.  Assessment/Plan: Sepsis due to loculated Effusion/Empyema from Left Obstructed Pleural Catheter for recurrent and chronic Non Malignant Pleural Effusions with hypoxic respiratory failure - CT Scan on admission showed Moderate-sized loculated left pleural effusion collection   - IR placed CT Guided Fluid Drain by PigTail Catheter 10/16 and drained ~550 mL. - Initially placed on vancomycin and Zosyn  - Sepsis physiology resolved; Old left PleurX catheter removed by TCTS10/19. Vancomycin was discontinued on 10/22, MRSAPCR negative.  -taken to OR 10/19 with removal of old catheter and placement of new left sided chest tube  - post op had significant leukocytosis, up to 25 >> 23.9 >>17.3>>15.5>>19.9>>15.2>>13.4>>10.5 -second chest tube  placed in an adjacent collection on 10/20 - removed one of thechest tubes10/22 per TCTS - Fluid collected on 10/19 during surgery grew coagulase-negative staph which is pansensitive.  -10/27--d/c zosyn, start augmentin  Acute respiratory failure with hypoxia -secondary to empyema and CHF -04/14/16 CXR--increase pulm vasc congestion -04/15/16 CXR personally reviewd--unchanged interstitial edema -04/17/16 CXR personally reviewed--interstitial prominence, sm-mod left effusion, sm R effusion -stable on 2 L   Acute diastolic CHF -Daily weights -04/17/16-pt lost IV access--did not get any IV lasix-->lasix 80 mg po now,  -PICC orders placed put ultimately able to get peripheral IV access -NEG 2.1 L with  IV lasix -10/29--remains clinically fluid overloaded--+JVD -Daily BMP -Echocardiogram--EF 50-55%, grade 2 DD, HK inferior wall, mild MR, AS -continue metoprolol succinate  Acute on chronic Renal failureCKD III - baseline Cr 1.3 - 1.7 - With increase in creatinine and his vancomycin was discontinued and his Avapro  stopped.  -am BMP -pt likely will have new baseline  Hx of Non-Hodgkins Lymphoma - PET-CT Scan showed Enlarging hypermetabolic mediastinal lymph nodes suggesting recurrent lymphoma. - Patient was seen by Hematologist/Oncologist [name],  -will have close outpatient follow up  - Continue scheduled follow-ups with Hematology/Oncology after empyema treatment.  Abdominal pain - patient withintermittent crampyabdominal pain, states that he occasionally  -Present for the past 3-4 months -suspect component of IBS -Tolerating diet - plain films normal - 10/29-CT abd/pelvis--fatty liver, sigmoid diverticulosis, no retroperitoneal lymphadenopathy, atrophic pancreas  MDS (myelodysplastic syndrome) with 5q deletion - Followed by Hematology/Oncology; Seen by [name] today - Continue Promacta.Patient's Hematologist increased it to 75 mg and will reassess next  visit.  Hypokalemia - Improved, replete prn  Insulin Dependent Type 2 Diabetes  Mellitus  - Continue Lantus and SSI,  -addednovolog 2 units tiw -overall controlled  CAD  - Continue Metoprolol Succinate 100 mg by mouth daily -Irbesartan was held due to renal function  - Clopidogrel was heldby Hematology/Oncology because of Thrombocytopenia.  Essential Hypertension - Continue Metoprolol succinate100 mg po daily -expect continued improvement with diuresis  Thrombocytopenia  - Platelet Count stable/improving - SCDs for DVT prophylaxis. - Continue Promacta. Patient's Hematologist increased it to 75 mg and will reassess next visit. - Hematology recommended stopping Clopidogrel; P2Y12 from 10/15 WNL  NormcyticAnemia - Continue Ferrous Sulfate 325 mg tablet po  -indices suggest anemia of chronic disease -baseline Hgb ~8   DVT prophylaxis:SCD Code Status:Full Family Communication:wife and daughter updatedbedside 04/17/16    Disposition Plan: Home in 2-3 days if cleared by TCTS  Consultants:  Cardiothoracic surgery  IR  HemOnc  Procedures:  CT Guided IR Pigtail Cathether Drain  Antimicrobials:  Vancomycin 10/14 >> 10/22  Zosyn 10/14 >>04/16/16  Augmentin 04/16/16>>>    Subjective: Patient denies fevers, chills, headache, chest pain, dyspnea, nausea, vomiting, diarrhea, abdominal pain, dysuria, hematuria, hematochezia, and melena.   Objective: Vitals:   04/17/16 1540 04/17/16 2102 04/18/16 0458 04/18/16 1517  BP: (!) 150/66 (!) 146/80 133/61 137/61  Pulse:  (!) 103 99 88  Resp: 18 18 18 18   Temp: 97.5 F (36.4 C) 98.1 F (36.7 C) 99 F (37.2 C) 97.9 F (36.6 C)  TempSrc: Oral Oral Oral Oral  SpO2: 100% 97% 95% 97%  Weight:   86.3 kg (190 lb 4 oz)   Height:        Intake/Output Summary (Last 24 hours) at 04/18/16 1635 Last data filed at 04/18/16 1500  Gross per 24 hour  Intake              840 ml  Output              1580 ml  Net             -740 ml   Weight change: -0.748 kg (-1 lb 10.4 oz) Exam:   General:  Pt is alert, follows commands appropriately, not in acute distress  HEENT: No icterus, No thrush, No neck mass, /AT  Cardiovascular: RRR, S1/S2, no rubs, no gallops; +JVD  Respiratory: Bibasilar crackles and no wheezing. Good air movement.  Abdomen: Soft/+BS, non tender, non distended, no guarding  Extremities: 1 + LE edema, No lymphangitis, No petechiae, No rashes, no synovitis   Data Reviewed: I have personally reviewed following labs and imaging studies Basic Metabolic Panel:  Recent Labs Lab 04/14/16 0243 04/15/16 0149 04/16/16 0147 04/17/16 0343 04/18/16 0211  NA 142 141 140 141 138  K 3.7 3.7 3.4* 3.5 3.6  CL 106 103 103 101 100*  CO2 29 30 32 32 31  GLUCOSE 116* 104* 117* 130* 121*  BUN 20 19 18 16 16   CREATININE 2.02* 2.00* 1.99* 1.95* 1.85*  CALCIUM 8.0* 7.8* 7.7* 7.5* 7.7*  MG  --   --   --  1.9  --    Liver Function Tests: No results for input(s): AST, ALT, ALKPHOS, BILITOT, PROT, ALBUMIN in the last 168 hours. No results for input(s): LIPASE, AMYLASE in the last 168 hours. No results for input(s): AMMONIA in the last 168 hours. Coagulation Profile: No results for input(s): INR, PROTIME in the last 168 hours. CBC:  Recent Labs Lab 04/14/16 0243 04/15/16 1422 04/16/16 0147 04/17/16 0343 04/18/16 0211  WBC 19.8* 15.2* 13.4* 10.5 10.4  HGB 8.1* 7.6* 7.5* 7.7* 7.3*  HCT 27.9* 26.6* 25.5* 26.5* 25.4*  MCV 79.9 82.1 79.9 81.0 79.6  PLT 110* 75* 79* 67* 63*   Cardiac Enzymes: No results for input(s): CKTOTAL, CKMB, CKMBINDEX, TROPONINI in the last 168 hours. BNP: Invalid input(s): POCBNP CBG:  Recent Labs Lab 04/17/16 1618 04/17/16 2104 04/18/16 0620 04/18/16 1127 04/18/16 1522  GLUCAP 120* 204* 111* 185* 205*   HbA1C: No results for input(s): HGBA1C in the last 72 hours. Urine analysis:    Component Value Date/Time   COLORURINE AMBER  (A) 04/03/2016 2007   APPEARANCEUR CLOUDY (A) 04/03/2016 2007   LABSPEC 1.022 04/03/2016 2007   PHURINE 5.0 04/03/2016 2007   GLUCOSEU NEGATIVE 04/03/2016 2007   HGBUR SMALL (A) 04/03/2016 2007   BILIRUBINUR NEGATIVE 04/03/2016 2007   Virgie NEGATIVE 04/03/2016 2007   PROTEINUR 100 (A) 04/03/2016 2007   NITRITE NEGATIVE 04/03/2016 2007   LEUKOCYTESUR NEGATIVE 04/03/2016 2007   Sepsis Labs: @LABRCNTIP (procalcitonin:4,lacticidven:4) )No results found for this or any previous visit (from the past 240 hour(s)).   Scheduled Meds: . amoxicillin-clavulanate  1 tablet Oral Q12H  . [START ON 05/01/2016] cyanocobalamin  1,000 mcg Intramuscular Q30 days  . dextromethorphan  30 mg Oral QHS  . doxazosin  4 mg Oral QHS  . eltrombopag  75 mg Oral QAC lunch  . ferrous sulfate  325 mg Oral QPC supper  . fluticasone  1 spray Each Nare QHS  . furosemide  40 mg Intravenous BID  . guaiFENesin  600 mg Oral BID  . insulin aspart  0-15 Units Subcutaneous TID WC  . insulin glargine  15 Units Subcutaneous Daily  . metoprolol succinate  100 mg Oral Daily  . multivitamin with minerals  1 tablet Oral QHS  . potassium chloride  20 mEq Oral Daily  . sodium chloride flush  3 mL Intravenous Q12H   Continuous Infusions:   Procedures/Studies: Ct Abdomen Pelvis Wo Contrast  Result Date: 04/18/2016 CLINICAL DATA:  82 year old male with abdominal pain. History of prostate cancer and lymphoma. EXAM: CT ABDOMEN AND PELVIS WITHOUT CONTRAST TECHNIQUE: Multidetector CT imaging of the abdomen and pelvis was performed following the standard protocol without IV contrast. COMPARISON:  Chest CT dated 04/05/2016 and PETCT dated 04/02/2016 FINDINGS: Evaluation of this exam is limited in the absence of intravenous contrast. Lower chest: Partially visualized bilateral pleural effusions. There has been interval increase in the size of the pleural effusion on the right since the CT dated 04/05/2016. There is partial compressive  atelectasis of the right lower lobe. Pneumonia is not excluded. Left lung base consolidative changes and airspace infiltrative disease similar to prior CT. There has been interval removal of the previously seen left sided thoracostomy tube. A loculated left subpulmonic fluid may be present. There is coronary vascular calcification. There is hypoattenuation of the cardiac blood pool suggestive of a degree of anemia. Clinical correlation is recommended. No intra-abdominal free air or free fluid. Hepatobiliary: There is diffuse fatty infiltration of the liver. Scattered punctate calcified granuloma. No intrahepatic biliary ductal dilatation. The gallbladder appears unremarkable. Pancreas: The pancreas is somewhat atrophic. No duct dilatation or peripancreatic inflammation. Spleen: Normal in size without focal abnormality. Adrenals/Urinary Tract: The adrenal glands appear unremarkable. Bilateral renal vascular calcifications noted. A punctate nonobstructing right renal calculus. There is no hydronephrosis on either side. A 1.5 cm exophytic right renal hypodense lesion as well as a 1.5 cm left renal parenchymal interpolar hypodensity are not well characterized but likely represent cysts. The visualized  ureters and urinary bladder appear unremarkable. Stomach/Bowel: There is extensive sigmoid diverticulosis with muscular hypertrophy. No definite active inflammatory changes. There is loss of fat plane between the sigmoid colon and bladder dome, likely related to chronic inflammation and adhesions. The colovesical fistula is not entirely excluded. However no gas is seen within the bladder. There is no evidence of bowel obstruction. Normal appendix. Vascular/Lymphatic: There is moderate aortoiliac atherosclerotic disease. Mild ectasia of the infrarenal abdominal aorta measuring up to 2.3 cm. Right renal artery stent noted. No portal venous gas identified. There is no adenopathy. Reproductive: Prostate brachytherapy seeds  noted. Other: Large fat containing right inguinal hernia without evidence of inflammatory changes. No fluid collection. Musculoskeletal: Mild diffuse subcutaneous stranding. There is degenerative changes of the spine with bone spurring and osteophyte formation. Multilevel facet hypertrophy as well as mild lower lumbar disc bulge. No acute fracture. No suspicious lesions. The stable sclerotic focus in the left femoral neck most likely represents a bone island. IMPRESSION: Partially visualized bilateral pleural effusions with interval increase in the size of the right pleural effusion compared to prior study. Interval removal of the left-sided pleural drainage catheter. The visualized left lung base with appears similar to prior CT. No definite acute intra-abdominal pelvic pathology identified. No adenopathy or evidence of metastatic disease. Extensive sigmoid diverticulosis without definite active inflammatory changes. No bowel obstruction. Normal appendix. Probable adhesion of the sigmoid to the dome of the bladder without definite evidence of colovesical fistula. Electronically Signed   By: Anner Crete M.D.   On: 04/18/2016 02:20   Dg Chest 2 View  Result Date: 04/07/2016 CLINICAL DATA:  Left-sided chest tube, weakness, history of recurrent 9 malignant left pleural effusion. Also history of lymphoma EXAM: CHEST  2 VIEW COMPARISON:  Chest x-ray of 04/06/2016 and CT chest of 04/03/2016 FINDINGS: The left chest tube remains and no residual pneumothorax is seen. Apparently loculated left pleural effusion is unchanged. The pigtail of the catheter lies just superior to the superior extent of the apparently loculated effusion. Mild bibasilar atelectasis is present. Heart size is stable. IMPRESSION: 1. No pneumothorax is currently seen. 2. No change in left chest tube and apparently loculated left pleural effusion. Electronically Signed   By: Ivar Drape M.D.   On: 04/07/2016 13:23   Dg Chest 2 View  Result  Date: 04/03/2016 CLINICAL DATA:  Sepsis.  Concern for empyema EXAM: CHEST  2 VIEW COMPARISON:  03/03/2016 FINDINGS: Loculated left pleural effusion, moderate size, thickest at the base. There is a tunneled pleural catheter at the left base with alignment that is stable from 04/02/2016. The underlying lung is partly atelectatic. Normal heart size. The lower aorta is obscured by a medial pleural disease. IMPRESSION: Moderate loculated left pleural effusion with tunneled pleural catheter. Appearance is stable compared to 04/02/2016 PET-CT. Electronically Signed   By: Monte Fantasia M.D.   On: 04/03/2016 20:58   Ct Chest W Contrast  Result Date: 04/03/2016 CLINICAL DATA:  82 year old male with fever. Patient with left pleural drainage catheter. History of lymphoma. EXAM: CT CHEST WITH CONTRAST TECHNIQUE: Multidetector CT imaging of the chest was performed during intravenous contrast administration. CONTRAST:  64mL ISOVUE-300 IOPAMIDOL (ISOVUE-300) INJECTION 61% COMPARISON:  04/02/2016 PET-CT and prior studies. FINDINGS: Cardiovascular: Upper limits normal heart size and moderate coronary artery calcifications noted. There is no evidence of thoracic aortic aneurysm. Mediastinum/Nodes: Multiple shotty and enlarged mediastinal lymph nodes are identified, the largest in the lower thoracic para-aortic region. These mediastinal lymph nodes for mildly hypermetabolic  on recent PET study suspicious for recurrent lymphoma. There is no evidence of pericardial effusion. Lungs/Pleura: A moderate loculated left pleural effusion is again identified. A left pleural catheter with tip in the posterior-inferior left pleural space again noted. Left lower lung atelectasis identified. Mild interlobular septal thickening is noted within both lungs. No discrete mass identified. There is no evidence of pneumothorax or right pleural effusion. Upper Abdomen: No acute abnormality. Upper limits normal spleen size noted. Musculoskeletal: No  acute or suspicious abnormality. Degenerative disc disease in the lower cervical spine identified. IMPRESSION: Moderate loculated left pleural effusion, with left pleural catheter tip in the posterior inferior left pleural space. Associated left lower lung atelectasis. Mediastinal lymph nodes as described, mildly hypermetabolic on recent PET study suspicious for recurrent lymphoma. Coronary artery disease. Electronically Signed   By: Margarette Canada M.D.   On: 04/03/2016 21:47   Nm Pet Image Restag (ps) Skull Base To Thigh  Result Date: 04/02/2016 CLINICAL DATA:  Subsequent treatment strategy for lymphoma. EXAM: NUCLEAR MEDICINE PET SKULL BASE TO THIGH TECHNIQUE: 12.4 mCi F-18 FDG was injected intravenously. Full-ring PET imaging was performed from the skull base to thigh after the radiotracer. CT data was obtained and used for attenuation correction and anatomic localization. FASTING BLOOD GLUCOSE:  Value: 119 mg/dl COMPARISON:  10/30/2014 FINDINGS: NECK No hypermetabolic lymph nodes in the neck. CHEST There is a moderate-sized left loculated pleural effusion. No hypermetabolism to suggest a malignant effusion. It measures as simple fluid. New line small nodules are noted along the left major fissure. These are likely lymph nodes. Is also 3 subpleural nodules in the left upper on image number 104. No obvious hypermetabolism. SUV max is 1.7. These are suspicious for lymphomatous nodules however and they are new since the prior study. Enlarging mediastinal lymph nodes are mildly hypermetabolic and suspicious for recurrent lymphoma. 5 mm pretracheal lymph node on image number 87 has an SUV max of 2.56. Multiple small prevascular lymph nodes. The largest node measures 5 mm on image number 100 and SUV max is 2.46. 11 mm aorticopulmonary window lymph node on image number 94 has an SUV max of 3.5. Subcarinal lymph node on image number 109 measures 10 mm and has an SUV max of 3.5. Left-sided retrocrural lymph node  measures 23 x 16 mm on image number 127 and has SUV max of 5.9. Right-sided periaortic lymph node on image number 121 measures 8.5 mm and SUV max is 4.1. ABDOMEN/PELVIS No abnormal hypermetabolic activity within the liver, pancreas, adrenal glands, or spleen. No hypermetabolic lymph nodes in the abdomen or pelvis. Stable advanced atherosclerotic calcifications involving the aorta and branch vessels. The right renal artery stent is noted. Brachytherapy seeds are noted the prostate gland. There is a large right inguinal hernia containing fat. SKELETON No focal hypermetabolic activity to suggest skeletal metastasis. IMPRESSION: 1. Enlarging hypermetabolic mediastinal lymph nodes suggesting recurrent lymphoma. 2. New nodules along the left major fissure and in the left upper lobe subpleural space worrisome for lymphomatous involvement. 3. Moderate-sized loculated left pleural effusion collection with a PleurX drainage catheter in place. 4. No findings for adenopathy in the neck, abdomen or pelvis. Electronically Signed   By: Marijo Sanes M.D.   On: 04/02/2016 14:52   Dg Chest Port 1 View  Result Date: 04/18/2016 CLINICAL DATA:  Pleural effusion EXAM: PORTABLE CHEST 1 VIEW COMPARISON:  04/15/2016 chest radiograph. FINDINGS: Left chest tube terminates at the far periphery of the left upper pleural space. Stable cardiomediastinal silhouette with top-normal  heart size and aortic atherosclerosis. No pneumothorax. Small to moderate left pleural effusion is stable. Small right pleural effusion is stable. Patchy opacities throughout both lungs, left greater than right, not appreciably changed. IMPRESSION: 1. Small to moderate left pleural effusion, stable. Left chest tube terminates at the far periphery of the left upper pleural space. No pneumothorax. 2. Small right pleural effusion, stable . 3. Patchy bilateral lung opacities, left greater than right, not appreciably changed, differential includes multilobar infection  and/or edema. 4. Aortic atherosclerosis. Electronically Signed   By: Ilona Sorrel M.D.   On: 04/18/2016 08:34   Dg Chest Port 1 View  Result Date: 04/15/2016 CLINICAL DATA:  Chest 2 present. Shortness of breath. Pulmonary edema. EXAM: PORTABLE CHEST 1 VIEW COMPARISON:  1 day prior FINDINGS: Left-sided small bore chest tube is unchanged in position. Midline trachea. Cardiomegaly accentuated by AP portable technique. Apparent superior mediastinal soft tissue fullness is favored to be due to AP portable technique. Atherosclerosis in the transverse aorta. Loculated left-sided pleural effusion is small and similar given differences in technique. No pneumothorax. Interstitial edema is moderate, accentuated by diminished lung volumes today. Slight worsening left base aeration with persistent bibasilar airspace opacities. IMPRESSION: Small bore left-sided chest tube remaining in place with persistent loculated left-sided effusion. No pneumothorax. Diminished lung volumes. Persistent interstitial edema with similar right and increased left base airspace disease. Electronically Signed   By: Abigail Miyamoto M.D.   On: 04/15/2016 07:51   Dg Chest Port 1 View  Result Date: 04/14/2016 CLINICAL DATA:  Left chest tube followup EXAM: PORTABLE CHEST 1 VIEW COMPARISON:  04/12/2016 FINDINGS: Left-sided chest tube is in place. No pneumothorax identified. The left pleural effusion is not significantly changed in volume compared with previous exam. Moderate diffuse pulmonary edema is identified which appears increased in the interval. IMPRESSION: 1. Stable appearance of left chest tube. No change in volume of left effusion. 2. Increase in diffuse pulmonary edema pattern. Electronically Signed   By: Kerby Moors M.D.   On: 04/14/2016 08:52   Dg Chest Port 1 View  Result Date: 04/12/2016 CLINICAL DATA:  Followup left-sided pleural effusion. EXAM: PORTABLE CHEST 1 VIEW COMPARISON:  04/10/2016 FINDINGS: The left-sided pleural  drainage catheter is stable. There is a persistent small to moderate-sized left pleural effusion with overlying atelectasis. Persistent central vascular congestion. IMPRESSION: Persistent left-sided pleural effusion and overlying atelectasis. Persistent central vascular congestion Electronically Signed   By: Marijo Sanes M.D.   On: 04/12/2016 09:13   Dg Chest Port 1 View  Result Date: 04/10/2016 CLINICAL DATA:  Followup for left chest tube. Left pleural effusion. EXAM: PORTABLE CHEST 1 VIEW COMPARISON:  04/09/2016 FINDINGS: Left-sided pigtail catheter is stable, projecting along the lateral left mid to upper hemi thorax. There is persistent left pleural fluid and left lung base parenchymal opacity. No new lung abnormalities. No pneumothorax. IMPRESSION: 1. No change from the previous day's study. 2. Stable left chest tube.  No pneumothorax. 3. Persistent left lung base opacity with an associated pleural effusion. Electronically Signed   By: Lajean Manes M.D.   On: 04/10/2016 07:51   Dg Chest Port 1 View  Result Date: 04/09/2016 CLINICAL DATA:  Soreness in chest.  Chest tube present. EXAM: PORTABLE CHEST 1 VIEW COMPARISON:  04/08/2016.  04/03/2016. FINDINGS: Again noted is a left basilar chest tube and a small bore pigtail chest tube in the lateral upper chest. There continues to be evidence for a loculated left pleural fluid which is caudal to the  pigtail catheter. Evidence for compressive atelectasis at the left lung base. Slightly increased densities in the right lower lung. Heart size is stable. Trachea is midline. Negative for pneumothorax. IMPRESSION: No change in the loculated left pleural effusion. Stable position of the left chest tubes without a pneumothorax. Electronically Signed   By: Markus Daft M.D.   On: 04/09/2016 08:56   Dg Chest Port 1 View  Result Date: 04/08/2016 CLINICAL DATA:  Shortness of breath. EXAM: PORTABLE CHEST 1 VIEW COMPARISON:  04/08/2016.  04/07/2016. FINDINGS: PleurX  catheter again noted on the left in stable position. No pneumothorax. Cardiomegaly with bilateral pulmonary interstitial prominence and left-sided pleural effusion noted consistent congestive heart failure. Similar finding noted on prior exam from earlier today. IMPRESSION: 1. Left chest tube noted in stable position. No pneumothorax. Stable left pleural effusion. 2. Persistent changes of congestive heart failure with pulmonary interstitial edema. No interim change from earlier exam today. Electronically Signed   By: Marcello Moores  Register   On: 04/08/2016 13:36   Dg Chest Portable 1 View  Result Date: 04/08/2016 CLINICAL DATA:  PleurX catheter placement. EXAM: PORTABLE CHEST 1 VIEW COMPARISON:  04/07/2016. FINDINGS: PleurX catheter on the left again noted. Small left pleural effusion again noted. No pneumothorax . Cardiomegaly with pulmonary vascular prominence and mild interstitial prominence noted. Mild component congestive heart failure cannot be excluded. IMPRESSION: 1. PleurX catheter on the left again noted. No pneumothorax. Small left pleural effusion again noted. 2. Cannot exclude mild congestive heart failure with mild interstitial edema. Electronically Signed   By: Marcello Moores  Register   On: 04/08/2016 12:31   Dg Chest Port 1 View  Addendum Date: 04/07/2016   ADDENDUM REPORT: 04/07/2016 08:27 ADDENDUM: Critical Value/emergent results were called by telephone at the time of interpretation on 04/07/2016 at 8:27 am to the patient's interventional radiologist, who verbally acknowledged these results. Electronically Signed   By: Marcello Moores  Register   On: 04/07/2016 08:27   Result Date: 04/07/2016 CLINICAL DATA:  Shortness of breath. EXAM: PORTABLE CHEST 1 VIEW COMPARISON:  CT 04/05/2016. FINDINGS: Left chest tube noted. Previously identified large left left up pleural effusion is been removed. Residual left lower pleural effusion. Tiny left pneumothorax present. This was discussed with the patient's  interventional radiologist. Stable cardiomegaly. No acute bony abnormality. IMPRESSION: Left chest tube noted in good anatomic position. Significant resolution of left-sided pneumothorax. Residual left lower pleural effusion present. Miniscule left pneumothorax noted. Electronically Signed: By: Marcello Moores  Register On: 04/06/2016 16:30   Dg Chest Port 1 View  Result Date: 04/05/2016 CLINICAL DATA:  Preprocedural evaluation for pleural drainage catheter. EXAM: PORTABLE CHEST 1 VIEW COMPARISON:  CT 04/03/2016.  Chest x-ray 04/03/2016 . FINDINGS: Progressive left pleural effusion noted. Some degree of loculation may be present. Mild cardiomegaly with mild interstitial prominence, a component of mild congestive heart failure cannot be excluded. No pneumothorax . IMPRESSION: 1. Progressive left pleural effusion with possible loculation. 2. Cardiomegaly with mild interstitial prominence. A component of mild congestive heart failure cannot be excluded . Electronically Signed   By: Marcello Moores  Register   On: 04/05/2016 08:01   Dg Abd Portable 2v  Result Date: 04/10/2016 CLINICAL DATA:  Abdominal pain EXAM: PORTABLE ABDOMEN - 2 VIEW COMPARISON:  None. FINDINGS: Scattered large and small bowel gas is noted. A left thoracostomy catheter is noted over the base. No obstructive changes are seen. No free air is noted. Mild degenerative changes of the lumbar spine are seen. IMPRESSION: No acute abnormality noted. Electronically Signed  By: Inez Catalina M.D.   On: 04/10/2016 08:58   Ct Image Guided Fluid Drain By Catheter  Result Date: 04/05/2016 CLINICAL DATA:  Lymphoma, and left-sided empyema with residual loculated left pleural fluid remaining. Request has been made to place a percutaneous pleural drainage catheter. EXAM: CT GUIDED LEFT THORACOSTOMY TUBE PLACEMENT FOR EMPYEMA ANESTHESIA/SEDATION: 1.0 mg IV Versed 25 mcg IV Fentanyl Total Moderate Sedation Time:  20 minutes The patient's level of consciousness and  physiologic status were continuously monitored during the procedure by Radiology nursing. PROCEDURE: The procedure, risks, benefits, and alternatives were explained to the patient's wife. Questions regarding the procedure were encouraged and answered. The patient's wife understands and consents to the procedure. A time out was performed prior to initiating the procedure. The left upper chest wall was prepped with chlorhexidine in a sterile fashion, and a sterile drape was applied covering the operative field. A sterile gown and sterile gloves were used for the procedure. Local anesthesia was provided with 1% Lidocaine. CT was performed in a supine position. After localizing the most appropriate target for pleural drainage, an 18 gauge trocar needle was advanced under CT guidance into the left pleural space. Fluid was aspirated. A guidewire was then advanced through the needle and the needle removed. The percutaneous tract was dilated over the guidewire. A 12 French pigtail drainage catheter was then advanced over the wire. After the catheter was formed, additional CT images were performed. The catheter was then connected to a [place] Pleur-Evac device. COMPLICATIONS: None FINDINGS: The largest loculation of residual left pleural fluid is in the lateral superior hemithorax. Aspiration yielded clear, yellowish fluid. Thorax. After pleural drain placement, there is return of yellowish colored clear fluid. IMPRESSION: CT-guided left chest thoracostomy tube placement with placement of 12 French drain into the left pleural space. The site of loculated fluid in the superior and lateral aspect of the hemithorax was targeted for catheter placement. Electronically Signed   By: Aletta Edouard M.D.   On: 04/05/2016 15:54    Renley Gutman, DO  [facility] Pager [phone]  If 7PM-7AM, please contact night-coverage [email or web address] Password [email or web address] 04/18/2016, 4:35 PM   LOS: 15 days

## 2016-04-18 NOTE — Progress Notes (Addendum)
      [address]       [place] [address]             [phone]       10 Days Post-Op Procedure(s) (LRB): REMOVAL OF PLEURAL DRAINAGE CATHETER (Left) CHEST TUBE INSERTION (Left)  Subjective: Patient asleep  Objective: Vital signs in last 24 hours: Temp:  [97.5 F (36.4 C)-99 F (37.2 C)] 99 F (37.2 C) (10/29 0458) Pulse Rate:  [99-103] 99 (10/29 0458) Cardiac Rhythm: Normal sinus rhythm (10/29 0700) Resp:  [18] 18 (10/29 0458) BP: (133-150)/(61-80) 133/61 (10/29 0458) SpO2:  [95 %-100 %] 95 % (10/29 0458) FiO2 (%):  [2 %] 2 % (10/28 1540) Weight:  [190 lb 4 oz (86.3 kg)] 190 lb 4 oz (86.3 kg) (10/29 0458)     Intake/Output from previous day: 10/28 0701 - 10/29 0700 In: 960 [P.O.:960] Out: 1091 [Urine:1080; Stool:1; Chest Tube:10]   Physical Exam:  Cardiovascular: IRRR Pulmonary: Clear to auscultation on right and diminshed left base Extremities: SCDs in place Wounds: Clean and dry.  No erythema or signs of infection. Chest Tube: to water seal,serous drainage  Lab Results: CBC:  Recent Labs  04/17/16 0343 04/18/16 0211  WBC 10.5 10.4  HGB 7.7* 7.3*  HCT 26.5* 25.4*  PLT 67* 63*   BMET:   Recent Labs  04/17/16 0343 04/18/16 0211  NA 141 138  K 3.5 3.6  CL 101 100*  CO2 32 31  GLUCOSE 130* 121*  BUN 16 16  CREATININE 1.95* 1.85*  CALCIUM 7.5* 7.7*    PT/INR: No results for input(s): LABPROT, INR in the last 72 hours. ABG:  INR: Will add last result for INR, ABG once components are confirmed Will add last 4 CBG results once components are confirmed  Assessment/Plan:  1. CV -  On Toprol XL 100 mg daily 2.  Pulmonary - On 2 liters via [place]. Small left chest tube to Pleura Vac. Scant output last 24 hours. Obtain CXR this am. There is fibrous strand in small chest tube but is output in tube and Pleura Vac. 3. Anemia-H and H stable at 7.3 and 25.4. On ferrous sulfate. 4. MDS-on Promacta.Thrombocytopenia-platelets  63,000 5. Acute on chronic renal failures (CKD stage III)-Creatinine slightly decreased from 1.95 to 1.85 6. ID-on Augmentin for coag neg staph  ZIMMERMAN,DONIELLE MPA-C 04/18/2016,7:18 AM  Ct of abdomen done at 2am Some increased right effusion  Likely remove pig tail in am I have seen and examined [employer] and agree with the above assessment  and plan.  Grace Isaac MD Beeper [phone] Office [phone] 04/18/2016 11:41 AM

## 2016-04-19 LAB — BASIC METABOLIC PANEL
Anion gap: 7 (ref 5–15)
BUN: 18 mg/dL (ref 6–20)
CHLORIDE: 98 mmol/L — AB (ref 101–111)
CO2: 32 mmol/L (ref 22–32)
CREATININE: 1.9 mg/dL — AB (ref 0.61–1.24)
Calcium: 7.7 mg/dL — ABNORMAL LOW (ref 8.9–10.3)
GFR calc Af Amer: 36 mL/min — ABNORMAL LOW (ref >60)
GFR calc non Af Amer: 31 mL/min — ABNORMAL LOW (ref >60)
Glucose, Bld: 106 mg/dL — ABNORMAL HIGH (ref 65–99)
Potassium: 3.8 mmol/L (ref 3.5–5.1)
Sodium: 137 mmol/L (ref 135–145)

## 2016-04-19 LAB — GLUCOSE, CAPILLARY
GLUCOSE-CAPILLARY: 161 mg/dL — AB (ref 65–99)
Glucose-Capillary: 105 mg/dL — ABNORMAL HIGH (ref 65–99)
Glucose-Capillary: 224 mg/dL — ABNORMAL HIGH (ref 65–99)
Glucose-Capillary: 85 mg/dL (ref 65–99)

## 2016-04-19 LAB — CBC
HCT: 27.4 % — ABNORMAL LOW (ref 39.0–52.0)
Hemoglobin: 8 g/dL — ABNORMAL LOW (ref 13.0–17.0)
MCH: 23.1 pg — AB (ref 26.0–34.0)
MCHC: 29.2 g/dL — AB (ref 30.0–36.0)
MCV: 79.2 fL (ref 78.0–100.0)
PLATELETS: 63 10*3/uL — AB (ref 150–400)
RBC: 3.46 MIL/uL — ABNORMAL LOW (ref 4.22–5.81)
RDW: 23.1 % — AB (ref 11.5–15.5)
WBC: 8.8 10*3/uL (ref 4.0–10.5)

## 2016-04-19 LAB — BRAIN NATRIURETIC PEPTIDE: B Natriuretic Peptide: 362.3 pg/mL — ABNORMAL HIGH (ref 0.0–100.0)

## 2016-04-19 MED ORDER — FUROSEMIDE 10 MG/ML IJ SOLN
60.0000 mg | Freq: Two times a day (BID) | INTRAMUSCULAR | Status: AC
  Administered 2016-04-19: 60 mg via INTRAVENOUS
  Filled 2016-04-19: qty 6

## 2016-04-19 MED ORDER — FUROSEMIDE 40 MG PO TABS
40.0000 mg | ORAL_TABLET | Freq: Every day | ORAL | Status: DC
  Administered 2016-04-20: 40 mg via ORAL
  Filled 2016-04-19: qty 1

## 2016-04-19 NOTE — Progress Notes (Addendum)
      [address]       ,[place] [address]             [phone]      11 Days Post-Op Procedure(s) (LRB): REMOVAL OF PLEURAL DRAINAGE CATHETER (Left) CHEST TUBE INSERTION (Left) Subjective: Shares that he is ready to go home. Had an uneventful weekend.   Objective: Vital signs in last 24 hours: Temp:  [97.7 F (36.5 C)-98.1 F (36.7 C)] 97.7 F (36.5 C) (10/30 0459) Pulse Rate:  [85-98] 85 (10/30 0459) Cardiac Rhythm: Normal sinus rhythm (10/29 1900) Resp:  [18] 18 (10/30 0459) BP: (130-143)/(58-67) 130/58 (10/30 0459) SpO2:  [96 %-98 %] 98 % (10/30 0459) Weight:  [190 lb 11.2 oz (86.5 kg)] 190 lb 11.2 oz (86.5 kg) (10/30 0553)     Intake/Output from previous day: 10/29 0701 - 10/30 0700 In: 720 [P.O.:720] Out: 2440 [Urine:2410; Chest Tube:30] Intake/Output this shift: No intake/output data recorded.  General appearance: alert, cooperative and no distress Heart: regular rate and rhythm, S1, S2 normal, no murmur, click, rub or gallop Lungs: clear to auscultation bilaterally Abdomen: soft, non-tender; bowel sounds normal; no masses,  no organomegaly Extremities: edema trace edema Wound: clean and dry  Lab Results:  Recent Labs  04/18/16 0211 04/19/16 0154  WBC 10.4 8.8  HGB 7.3* 8.0*  HCT 25.4* 27.4*  PLT 63* 63*   BMET:  Recent Labs  04/18/16 0211 04/19/16 0154  NA 138 137  K 3.6 3.8  CL 100* 98*  CO2 31 32  GLUCOSE 121* 106*  BUN 16 18  CREATININE 1.85* 1.90*  CALCIUM 7.7* 7.7*    PT/INR: No results for input(s): LABPROT, INR in the last 72 hours. ABG No results found for: PHART, HCO3, TCO2, ACIDBASEDEF, O2SAT CBG (last 3)   Recent Labs  04/18/16 1522 04/18/16 1954 04/19/16 0613  GLUCAP 205* 137* 105*    Assessment/Plan: S/P Procedure(s) (LRB): REMOVAL OF PLEURAL DRAINAGE CATHETER (Left) CHEST TUBE INSERTION (Left)  1. CV -  On Toprol XL 100 mg daily, NSR 80s 2.  Pulmonary - On 2 liters via [place]. Small left chest  tube to Pleura Vac. Scant output last 24 hours-9ml.There is fibrous strand in small chest tube but is output in tube and Pleura Vac on chest xray this weekend. No CXR this morning.   3. Anemia-H and H stable at 7.3 and 25.4. On ferrous sulfate. 4. MDS-on Promacta.Thrombocytopenia-platelets 63,000, stable 5. Acute on chronic renal failures (CKD stage III)-Creatinine stable at 1.85. Making excellent urine.  6. ID-on Augmentin for coag neg staph   Plan: Will discuss chest tube removal with attending. Output has been scant the last few days. Continue to encourage incentive spirometry and ambulation.   LOS: 16 days    Elgie Collard 04/19/2016 patient examined and medical record reviewed,agree with above note Okay to remove the pigtail catheter, continue antibiotics. Tharon Aquas Trigt III 04/19/2016

## 2016-04-19 NOTE — Progress Notes (Signed)
PROGRESS NOTE  Ricardo Watkins GPQ:[id number] DOB: 1931/04/15 DOA: 04/03/2016 PCP: [name], MD  Brief History: 82 y.o.malewith medical history significant of non-Hodgkin's lymphoma, CAD, type 2 diabetes, hyperlipidemia, hypertension, anemia, prostate CA, diverticulosis, cataracts, DVT, chronic cough, chronic left pleural effusion with insertion of left pleural catheter on 11/10/2015 by [name] who comes to the hospital due to fever, chills, worsening fatigue and malaise. Per patient, his daughter and wife, his chest tube drainage has been decreasing his output for the pastseveral days and he stopped completely the day PTA. He normally has up to 500 mL of drainage every other day. Patient was worked up and found to be septic and have a loculated effusion. TCV following patient, he went to OR on 10/19 and had the non functioning catheter removed and placed a new pigtail. Patient had a second chest tube placed in an adjacent collection on 10/20 which was removed on 10/22, currently with only one chest tube in place with minimal drainage. Initial drainage cultures positive for coag negative staph sensitive to penicillins, on Zosyn.  Assessment/Plan: Sepsis due to loculated Effusion/Empyema from Left Obstructed Pleural Catheter for recurrent and chronic Non Malignant Pleural Effusions with hypoxic respiratory failure - CT Scan on admission showed Moderate-sized loculated left pleural effusion collection with a PleurX drainage catheter in place.Patient was placed on broad-spectrum antibiotics of vancomycin and Zosyn  - Sepsis physiology resolved; Old left PleurX catheter removed by TCTS10/19. Vancomycin was discontinued on 10/22, MRSAPCR negative. - IR placed CT Guided Fluid Drain by PigTail Catheter 10/16 and drained ~550 mL. Patient taken to OR 10/19 with removal of old catheter and placement of a left sided chest tube  - post op had significant leukocytosis, up to 25 >>  23.9 >>17.3>>15.5>>19.9>>15.2>>13.4>>10.5 - removed one of thechest tubes10/22 per TCTS - Fluid collected on 10/19 during surgery grew coagulase-negative staph which is pansensitive.  -10/27--d/c zosyn, start augmentin--plan one month of abx total (start date 10/14)  Acute respiratory failure with hypoxia -secondary to empyema and CHF -04/14/16 CXR--increase pulm vasc congestion -04/15/16 CXR personally reviewd--unchanged interstitial edema -stable on 2 L   Acute diastolic CHF -Daily weights -04/17/16-pt lost IV access--did not get any IV lasix-->lasix 80 mg po now, place PICC -NEG 3.8 L with  IV lasix -10/27--increase lasix to 40 mg IV bid -possible switch to po lasix on 10/31 -Daily BMP -Echocardiogram--EF 50-55%, grade 2 DD, HK inferior wall, mild MR, AS -continue metoprolol succinate  Acute on chronic Renal failureCKD III - baseline Cr 1.3 - 1.7 - With increase in creatinine and his vancomycin was discontinued and his Avapro was also stopped.  -am BMP -pt likely will have new baseline  Hx of Non-Hodgkins Lymphoma - PET-CT Scan showed Enlarging hypermetabolic mediastinal lymph nodes suggesting recurrent lymphoma. - Patient was seen by Hematologist/Oncologist [name],  -will have close outpatient follow up  - Continue scheduled follow-ups with Hematology/Oncology after empyema treatment.  Abdominal pain - patient withintermittent crampyabdominal pain, states that he occasionally  -Present for the past 3-4 months -suspect component of IBS -Tolerating diet - plain films normal - 04/17/2016 CT abd/pelvis--fatty liver, atrophic pancreas, sigmoid diverticulosis, no acute worsening lymphadenopathy  MDS (myelodysplastic syndrome) with 5q deletion - Followed by Hematology/Oncology; Seen by [name] today - Continue Promacta.Patient's Hematologist increased it to 75 mg and will reassess next visit.  Hypokalemia - Improved, replete prn  Insulin Dependent  Type 2 Diabetes Mellitus  - Continue Lantus  and SSI,  -addednovolog 2 units tiw -overall controlled  CAD  - Continue Metoprolol Succinate 100 mg by mouth daily -Irbesartan was held due to renal function  - Clopidogrel was heldby Hematology/Oncology because of Thrombocytopenia.  Essential Hypertension - Continue Metoprolol succinate100 mg po daily -expect continued improvement with diuresis  Thrombocytopenia  - Platelet Count stable/improving - SCDs for DVT prophylaxis. - Continue Promacta. Patient's Hematologist increased it to 75 mg and will reassess next visit. - Hematology recommended stopping Clopidogrel; P2Y12 from 10/15 WNL   NormcyticAnemia - Continue Ferrous Sulfate 325 mg tablet po  -indices suggest anemia of chronic disease -baseline Hgb ~8   DVT prophylaxis:SCD Code Status:Full Family Communication:wife and daughter updatedbedside 04/19/16    Disposition Plan: Home 10/31 or 11/1 if cleared by TCTS  Consultants:  Cardiothoracic surgery  IR  HemOnc  Procedures:  CT Guided IR Pigtail Cathether Drain  Antimicrobials:  Vancomycin 10/14 >> 10/22  Zosyn 10/14 >>04/16/16  Augmentin 04/16/16>>>     Subjective: Patient denies fevers, chills, headache, chest pain, dyspnea, nausea, vomiting, diarrhea, abdominal pain, dysuria, hematuria, hematochezia, and melena.   Objective: Vitals:   04/18/16 1952 04/19/16 0459 04/19/16 0553 04/19/16 1352  BP: (!) 143/67 (!) 130/58  130/60  Pulse: 98 85  100  Resp: 18 18  18   Temp: 98.1 F (36.7 C) 97.7 F (36.5 C)  97.6 F (36.4 C)  TempSrc: Oral Oral  Oral  SpO2: 96% 98%  97%  Weight:   86.5 kg (190 lb 11.2 oz)   Height:        Intake/Output Summary (Last 24 hours) at 04/19/16 1757 Last data filed at 04/19/16 1646  Gross per 24 hour  Intake                0 ml  Output             1580 ml  Net            -1580 ml   Weight change: 0.203 kg (7.2 oz) Exam:   General:  Pt  is alert, follows commands appropriately, not in acute distress  HEENT: No icterus, No thrush, No neck mass, [place]/AT  Cardiovascular: RRR, S1/S2, no rubs, no gallops  Respiratory:  R-CTA; Left basilar crackles  Abdomen: Soft/+BS, non tender, non distended, no guarding  Extremities: trace LE edema, No lymphangitis, No petechiae, No rashes, no synovitis   Data Reviewed: I have personally reviewed following labs and imaging studies Basic Metabolic Panel:  Recent Labs Lab 04/15/16 0149 04/16/16 0147 04/17/16 0343 04/18/16 0211 04/19/16 0154  NA 141 140 141 138 137  K 3.7 3.4* 3.5 3.6 3.8  CL 103 103 101 100* 98*  CO2 30 32 32 31 32  GLUCOSE 104* 117* 130* 121* 106*  BUN 19 18 16 16 18   CREATININE 2.00* 1.99* 1.95* 1.85* 1.90*  CALCIUM 7.8* 7.7* 7.5* 7.7* 7.7*  MG  --   --  1.9  --   --    Liver Function Tests: No results for input(s): AST, ALT, ALKPHOS, BILITOT, PROT, ALBUMIN in the last 168 hours. No results for input(s): LIPASE, AMYLASE in the last 168 hours. No results for input(s): AMMONIA in the last 168 hours. Coagulation Profile: No results for input(s): INR, PROTIME in the last 168 hours. CBC:  Recent Labs Lab 04/15/16 1422 04/16/16 0147 04/17/16 0343 04/18/16 0211 04/19/16 0154  WBC 15.2* 13.4* 10.5 10.4 8.8  HGB 7.6* 7.5* 7.7* 7.3* 8.0*  HCT 26.6* 25.5* 26.5* 25.4*  27.4*  MCV 82.1 79.9 81.0 79.6 79.2  PLT 75* 79* 67* 63* 63*   Cardiac Enzymes: No results for input(s): CKTOTAL, CKMB, CKMBINDEX, TROPONINI in the last 168 hours. BNP: Invalid input(s): POCBNP CBG:  Recent Labs Lab 04/18/16 1522 04/18/16 1954 04/19/16 0613 04/19/16 1109 04/19/16 1613  GLUCAP 205* 137* 105* 224* 161*   HbA1C: No results for input(s): HGBA1C in the last 72 hours. Urine analysis:    Component Value Date/Time   COLORURINE AMBER (A) 04/03/2016 2007   APPEARANCEUR CLOUDY (A) 04/03/2016 2007   LABSPEC 1.022 04/03/2016 2007   PHURINE 5.0 04/03/2016 2007   GLUCOSEU  NEGATIVE 04/03/2016 2007   HGBUR SMALL (A) 04/03/2016 2007   BILIRUBINUR NEGATIVE 04/03/2016 2007   [place] NEGATIVE 04/03/2016 2007   PROTEINUR 100 (A) 04/03/2016 2007   NITRITE NEGATIVE 04/03/2016 2007   LEUKOCYTESUR NEGATIVE 04/03/2016 2007   Sepsis Labs: @LABRCNTIP (procalcitonin:4,lacticidven:4) )No results found for this or any previous visit (from the past 240 hour(s)).   Scheduled Meds: . amoxicillin-clavulanate  1 tablet Oral Q12H  . [START ON 05/01/2016] cyanocobalamin  1,000 mcg Intramuscular Q30 days  . dextromethorphan  30 mg Oral QHS  . doxazosin  4 mg Oral QHS  . eltrombopag  75 mg Oral QAC lunch  . ferrous sulfate  325 mg Oral QPC supper  . fluticasone  1 spray Each Nare QHS  . furosemide  60 mg Intravenous BID  . [START ON 04/20/2016] furosemide  40 mg Oral Daily  . guaiFENesin  600 mg Oral BID  . insulin aspart  0-15 Units Subcutaneous TID WC  . insulin glargine  15 Units Subcutaneous Daily  . metoprolol succinate  100 mg Oral Daily  . multivitamin with minerals  1 tablet Oral QHS  . potassium chloride  20 mEq Oral Daily  . sodium chloride flush  3 mL Intravenous Q12H   Continuous Infusions:   Procedures/Studies: Ct Abdomen Pelvis Wo Contrast  Result Date: 04/18/2016 CLINICAL DATA:  82 year old male with abdominal pain. History of prostate cancer and lymphoma. EXAM: CT ABDOMEN AND PELVIS WITHOUT CONTRAST TECHNIQUE: Multidetector CT imaging of the abdomen and pelvis was performed following the standard protocol without IV contrast. COMPARISON:  Chest CT dated 04/05/2016 and PETCT dated 04/02/2016 FINDINGS: Evaluation of this exam is limited in the absence of intravenous contrast. Lower chest: Partially visualized bilateral pleural effusions. There has been interval increase in the size of the pleural effusion on the right since the CT dated 04/05/2016. There is partial compressive atelectasis of the right lower lobe. Pneumonia is not excluded. Left lung base  consolidative changes and airspace infiltrative disease similar to prior CT. There has been interval removal of the previously seen left sided thoracostomy tube. A loculated left subpulmonic fluid may be present. There is coronary vascular calcification. There is hypoattenuation of the cardiac blood pool suggestive of a degree of anemia. Clinical correlation is recommended. No intra-abdominal free air or free fluid. Hepatobiliary: There is diffuse fatty infiltration of the liver. Scattered punctate calcified granuloma. No intrahepatic biliary ductal dilatation. The gallbladder appears unremarkable. Pancreas: The pancreas is somewhat atrophic. No duct dilatation or peripancreatic inflammation. Spleen: Normal in size without focal abnormality. Adrenals/Urinary Tract: The adrenal glands appear unremarkable. Bilateral renal vascular calcifications noted. A punctate nonobstructing right renal calculus. There is no hydronephrosis on either side. A 1.5 cm exophytic right renal hypodense lesion as well as a 1.5 cm left renal parenchymal interpolar hypodensity are not well characterized but likely represent cysts. The visualized ureters  and urinary bladder appear unremarkable. Stomach/Bowel: There is extensive sigmoid diverticulosis with muscular hypertrophy. No definite active inflammatory changes. There is loss of fat plane between the sigmoid colon and bladder dome, likely related to chronic inflammation and adhesions. The colovesical fistula is not entirely excluded. However no gas is seen within the bladder. There is no evidence of bowel obstruction. Normal appendix. Vascular/Lymphatic: There is moderate aortoiliac atherosclerotic disease. Mild ectasia of the infrarenal abdominal aorta measuring up to 2.3 cm. Right renal artery stent noted. No portal venous gas identified. There is no adenopathy. Reproductive: Prostate brachytherapy seeds noted. Other: Large fat containing right inguinal hernia without evidence of  inflammatory changes. No fluid collection. Musculoskeletal: Mild diffuse subcutaneous stranding. There is degenerative changes of the spine with bone spurring and osteophyte formation. Multilevel facet hypertrophy as well as mild lower lumbar disc bulge. No acute fracture. No suspicious lesions. The stable sclerotic focus in the left femoral neck most likely represents a bone island. IMPRESSION: Partially visualized bilateral pleural effusions with interval increase in the size of the right pleural effusion compared to prior study. Interval removal of the left-sided pleural drainage catheter. The visualized left lung base with appears similar to prior CT. No definite acute intra-abdominal pelvic pathology identified. No adenopathy or evidence of metastatic disease. Extensive sigmoid diverticulosis without definite active inflammatory changes. No bowel obstruction. Normal appendix. Probable adhesion of the sigmoid to the dome of the bladder without definite evidence of colovesical fistula. Electronically Signed   By: Anner Crete M.D.   On: 04/18/2016 02:20   Dg Chest 2 View  Result Date: 04/07/2016 CLINICAL DATA:  Left-sided chest tube, weakness, history of recurrent 9 malignant left pleural effusion. Also history of lymphoma EXAM: CHEST  2 VIEW COMPARISON:  Chest x-ray of 04/06/2016 and CT chest of 04/03/2016 FINDINGS: The left chest tube remains and no residual pneumothorax is seen. Apparently loculated left pleural effusion is unchanged. The pigtail of the catheter lies just superior to the superior extent of the apparently loculated effusion. Mild bibasilar atelectasis is present. Heart size is stable. IMPRESSION: 1. No pneumothorax is currently seen. 2. No change in left chest tube and apparently loculated left pleural effusion. Electronically Signed   By: Ivar Drape M.D.   On: 04/07/2016 13:23   Dg Chest 2 View  Result Date: 04/03/2016 CLINICAL DATA:  Sepsis.  Concern for empyema EXAM: CHEST  2  VIEW COMPARISON:  03/03/2016 FINDINGS: Loculated left pleural effusion, moderate size, thickest at the base. There is a tunneled pleural catheter at the left base with alignment that is stable from 04/02/2016. The underlying lung is partly atelectatic. Normal heart size. The lower aorta is obscured by a medial pleural disease. IMPRESSION: Moderate loculated left pleural effusion with tunneled pleural catheter. Appearance is stable compared to 04/02/2016 PET-CT. Electronically Signed   By: Monte Fantasia M.D.   On: 04/03/2016 20:58   Ct Chest W Contrast  Result Date: 04/03/2016 CLINICAL DATA:  82 year old male with fever. Patient with left pleural drainage catheter. History of lymphoma. EXAM: CT CHEST WITH CONTRAST TECHNIQUE: Multidetector CT imaging of the chest was performed during intravenous contrast administration. CONTRAST:  32mL ISOVUE-300 IOPAMIDOL (ISOVUE-300) INJECTION 61% COMPARISON:  04/02/2016 PET-CT and prior studies. FINDINGS: Cardiovascular: Upper limits normal heart size and moderate coronary artery calcifications noted. There is no evidence of thoracic aortic aneurysm. Mediastinum/Nodes: Multiple shotty and enlarged mediastinal lymph nodes are identified, the largest in the lower thoracic para-aortic region. These mediastinal lymph nodes for mildly hypermetabolic on  recent PET study suspicious for recurrent lymphoma. There is no evidence of pericardial effusion. Lungs/Pleura: A moderate loculated left pleural effusion is again identified. A left pleural catheter with tip in the posterior-inferior left pleural space again noted. Left lower lung atelectasis identified. Mild interlobular septal thickening is noted within both lungs. No discrete mass identified. There is no evidence of pneumothorax or right pleural effusion. Upper Abdomen: No acute abnormality. Upper limits normal spleen size noted. Musculoskeletal: No acute or suspicious abnormality. Degenerative disc disease in the lower  cervical spine identified. IMPRESSION: Moderate loculated left pleural effusion, with left pleural catheter tip in the posterior inferior left pleural space. Associated left lower lung atelectasis. Mediastinal lymph nodes as described, mildly hypermetabolic on recent PET study suspicious for recurrent lymphoma. Coronary artery disease. Electronically Signed   By: Margarette Canada M.D.   On: 04/03/2016 21:47   Nm Pet Image Restag (ps) Skull Base To Thigh  Result Date: 04/02/2016 CLINICAL DATA:  Subsequent treatment strategy for lymphoma. EXAM: NUCLEAR MEDICINE PET SKULL BASE TO THIGH TECHNIQUE: 12.4 mCi F-18 FDG was injected intravenously. Full-ring PET imaging was performed from the skull base to thigh after the radiotracer. CT data was obtained and used for attenuation correction and anatomic localization. FASTING BLOOD GLUCOSE:  Value: 119 mg/dl COMPARISON:  10/30/2014 FINDINGS: NECK No hypermetabolic lymph nodes in the neck. CHEST There is a moderate-sized left loculated pleural effusion. No hypermetabolism to suggest a malignant effusion. It measures as simple fluid. New line small nodules are noted along the left major fissure. These are likely lymph nodes. Is also 3 subpleural nodules in the left upper on image number 104. No obvious hypermetabolism. SUV max is 1.7. These are suspicious for lymphomatous nodules however and they are new since the prior study. Enlarging mediastinal lymph nodes are mildly hypermetabolic and suspicious for recurrent lymphoma. 5 mm pretracheal lymph node on image number 87 has an SUV max of 2.56. Multiple small prevascular lymph nodes. The largest node measures 5 mm on image number 100 and SUV max is 2.46. 11 mm aorticopulmonary window lymph node on image number 94 has an SUV max of 3.5. Subcarinal lymph node on image number 109 measures 10 mm and has an SUV max of 3.5. Left-sided retrocrural lymph node measures 23 x 16 mm on image number 127 and has SUV max of 5.9. Right-sided  periaortic lymph node on image number 121 measures 8.5 mm and SUV max is 4.1. ABDOMEN/PELVIS No abnormal hypermetabolic activity within the liver, pancreas, adrenal glands, or spleen. No hypermetabolic lymph nodes in the abdomen or pelvis. Stable advanced atherosclerotic calcifications involving the aorta and branch vessels. The right renal artery stent is noted. Brachytherapy seeds are noted the prostate gland. There is a large right inguinal hernia containing fat. SKELETON No focal hypermetabolic activity to suggest skeletal metastasis. IMPRESSION: 1. Enlarging hypermetabolic mediastinal lymph nodes suggesting recurrent lymphoma. 2. New nodules along the left major fissure and in the left upper lobe subpleural space worrisome for lymphomatous involvement. 3. Moderate-sized loculated left pleural effusion collection with a PleurX drainage catheter in place. 4. No findings for adenopathy in the neck, abdomen or pelvis. Electronically Signed   By: Marijo Sanes M.D.   On: 04/02/2016 14:52   Dg Chest Port 1 View  Result Date: 04/18/2016 CLINICAL DATA:  Pleural effusion EXAM: PORTABLE CHEST 1 VIEW COMPARISON:  04/15/2016 chest radiograph. FINDINGS: Left chest tube terminates at the far periphery of the left upper pleural space. Stable cardiomediastinal silhouette with top-normal heart  size and aortic atherosclerosis. No pneumothorax. Small to moderate left pleural effusion is stable. Small right pleural effusion is stable. Patchy opacities throughout both lungs, left greater than right, not appreciably changed. IMPRESSION: 1. Small to moderate left pleural effusion, stable. Left chest tube terminates at the far periphery of the left upper pleural space. No pneumothorax. 2. Small right pleural effusion, stable . 3. Patchy bilateral lung opacities, left greater than right, not appreciably changed, differential includes multilobar infection and/or edema. 4. Aortic atherosclerosis. Electronically Signed   By: Ilona Sorrel M.D.   On: 04/18/2016 08:34   Dg Chest Port 1 View  Result Date: 04/15/2016 CLINICAL DATA:  Chest 2 present. Shortness of breath. Pulmonary edema. EXAM: PORTABLE CHEST 1 VIEW COMPARISON:  1 day prior FINDINGS: Left-sided small bore chest tube is unchanged in position. Midline trachea. Cardiomegaly accentuated by AP portable technique. Apparent superior mediastinal soft tissue fullness is favored to be due to AP portable technique. Atherosclerosis in the transverse aorta. Loculated left-sided pleural effusion is small and similar given differences in technique. No pneumothorax. Interstitial edema is moderate, accentuated by diminished lung volumes today. Slight worsening left base aeration with persistent bibasilar airspace opacities. IMPRESSION: Small bore left-sided chest tube remaining in place with persistent loculated left-sided effusion. No pneumothorax. Diminished lung volumes. Persistent interstitial edema with similar right and increased left base airspace disease. Electronically Signed   By: Abigail Miyamoto M.D.   On: 04/15/2016 07:51   Dg Chest Port 1 View  Result Date: 04/14/2016 CLINICAL DATA:  Left chest tube followup EXAM: PORTABLE CHEST 1 VIEW COMPARISON:  04/12/2016 FINDINGS: Left-sided chest tube is in place. No pneumothorax identified. The left pleural effusion is not significantly changed in volume compared with previous exam. Moderate diffuse pulmonary edema is identified which appears increased in the interval. IMPRESSION: 1. Stable appearance of left chest tube. No change in volume of left effusion. 2. Increase in diffuse pulmonary edema pattern. Electronically Signed   By: Kerby Moors M.D.   On: 04/14/2016 08:52   Dg Chest Port 1 View  Result Date: 04/12/2016 CLINICAL DATA:  Followup left-sided pleural effusion. EXAM: PORTABLE CHEST 1 VIEW COMPARISON:  04/10/2016 FINDINGS: The left-sided pleural drainage catheter is stable. There is a persistent small to moderate-sized left  pleural effusion with overlying atelectasis. Persistent central vascular congestion. IMPRESSION: Persistent left-sided pleural effusion and overlying atelectasis. Persistent central vascular congestion Electronically Signed   By: Marijo Sanes M.D.   On: 04/12/2016 09:13   Dg Chest Port 1 View  Result Date: 04/10/2016 CLINICAL DATA:  Followup for left chest tube. Left pleural effusion. EXAM: PORTABLE CHEST 1 VIEW COMPARISON:  04/09/2016 FINDINGS: Left-sided pigtail catheter is stable, projecting along the lateral left mid to upper hemi thorax. There is persistent left pleural fluid and left lung base parenchymal opacity. No new lung abnormalities. No pneumothorax. IMPRESSION: 1. No change from the previous day's study. 2. Stable left chest tube.  No pneumothorax. 3. Persistent left lung base opacity with an associated pleural effusion. Electronically Signed   By: Lajean Manes M.D.   On: 04/10/2016 07:51   Dg Chest Port 1 View  Result Date: 04/09/2016 CLINICAL DATA:  Soreness in chest.  Chest tube present. EXAM: PORTABLE CHEST 1 VIEW COMPARISON:  04/08/2016.  04/03/2016. FINDINGS: Again noted is a left basilar chest tube and a small bore pigtail chest tube in the lateral upper chest. There continues to be evidence for a loculated left pleural fluid which is caudal to the pigtail  catheter. Evidence for compressive atelectasis at the left lung base. Slightly increased densities in the right lower lung. Heart size is stable. Trachea is midline. Negative for pneumothorax. IMPRESSION: No change in the loculated left pleural effusion. Stable position of the left chest tubes without a pneumothorax. Electronically Signed   By: Markus Daft M.D.   On: 04/09/2016 08:56   Dg Chest Port 1 View  Result Date: 04/08/2016 CLINICAL DATA:  Shortness of breath. EXAM: PORTABLE CHEST 1 VIEW COMPARISON:  04/08/2016.  04/07/2016. FINDINGS: PleurX catheter again noted on the left in stable position. No pneumothorax.  Cardiomegaly with bilateral pulmonary interstitial prominence and left-sided pleural effusion noted consistent congestive heart failure. Similar finding noted on prior exam from earlier today. IMPRESSION: 1. Left chest tube noted in stable position. No pneumothorax. Stable left pleural effusion. 2. Persistent changes of congestive heart failure with pulmonary interstitial edema. No interim change from earlier exam today. Electronically Signed   By: Marcello Moores  Register   On: 04/08/2016 13:36   Dg Chest Portable 1 View  Result Date: 04/08/2016 CLINICAL DATA:  PleurX catheter placement. EXAM: PORTABLE CHEST 1 VIEW COMPARISON:  04/07/2016. FINDINGS: PleurX catheter on the left again noted. Small left pleural effusion again noted. No pneumothorax . Cardiomegaly with pulmonary vascular prominence and mild interstitial prominence noted. Mild component congestive heart failure cannot be excluded. IMPRESSION: 1. PleurX catheter on the left again noted. No pneumothorax. Small left pleural effusion again noted. 2. Cannot exclude mild congestive heart failure with mild interstitial edema. Electronically Signed   By: Marcello Moores  Register   On: 04/08/2016 12:31   Dg Chest Port 1 View  Addendum Date: 04/07/2016   ADDENDUM REPORT: 04/07/2016 08:27 ADDENDUM: Critical Value/emergent results were called by telephone at the time of interpretation on 04/07/2016 at 8:27 am to the patient's interventional radiologist, who verbally acknowledged these results. Electronically Signed   By: Marcello Moores  Register   On: 04/07/2016 08:27   Result Date: 04/07/2016 CLINICAL DATA:  Shortness of breath. EXAM: PORTABLE CHEST 1 VIEW COMPARISON:  CT 04/05/2016. FINDINGS: Left chest tube noted. Previously identified large left left up pleural effusion is been removed. Residual left lower pleural effusion. Tiny left pneumothorax present. This was discussed with the patient's interventional radiologist. Stable cardiomegaly. No acute bony abnormality.  IMPRESSION: Left chest tube noted in good anatomic position. Significant resolution of left-sided pneumothorax. Residual left lower pleural effusion present. Miniscule left pneumothorax noted. Electronically Signed: By: Marcello Moores  Register On: 04/06/2016 16:30   Dg Chest Port 1 View  Result Date: 04/05/2016 CLINICAL DATA:  Preprocedural evaluation for pleural drainage catheter. EXAM: PORTABLE CHEST 1 VIEW COMPARISON:  CT 04/03/2016.  Chest x-ray 04/03/2016 . FINDINGS: Progressive left pleural effusion noted. Some degree of loculation may be present. Mild cardiomegaly with mild interstitial prominence, a component of mild congestive heart failure cannot be excluded. No pneumothorax . IMPRESSION: 1. Progressive left pleural effusion with possible loculation. 2. Cardiomegaly with mild interstitial prominence. A component of mild congestive heart failure cannot be excluded . Electronically Signed   By: Marcello Moores  Register   On: 04/05/2016 08:01   Dg Abd Portable 2v  Result Date: 04/10/2016 CLINICAL DATA:  Abdominal pain EXAM: PORTABLE ABDOMEN - 2 VIEW COMPARISON:  None. FINDINGS: Scattered large and small bowel gas is noted. A left thoracostomy catheter is noted over the base. No obstructive changes are seen. No free air is noted. Mild degenerative changes of the lumbar spine are seen. IMPRESSION: No acute abnormality noted. Electronically Signed   By:  Inez Catalina M.D.   On: 04/10/2016 08:58   Ct Image Guided Fluid Drain By Catheter  Result Date: 04/05/2016 CLINICAL DATA:  Lymphoma, and left-sided empyema with residual loculated left pleural fluid remaining. Request has been made to place a percutaneous pleural drainage catheter. EXAM: CT GUIDED LEFT THORACOSTOMY TUBE PLACEMENT FOR EMPYEMA ANESTHESIA/SEDATION: 1.0 mg IV Versed 25 mcg IV Fentanyl Total Moderate Sedation Time:  20 minutes The patient's level of consciousness and physiologic status were continuously monitored during the procedure by Radiology  nursing. PROCEDURE: The procedure, risks, benefits, and alternatives were explained to the patient's wife. Questions regarding the procedure were encouraged and answered. The patient's wife understands and consents to the procedure. A time out was performed prior to initiating the procedure. The left upper chest wall was prepped with chlorhexidine in a sterile fashion, and a sterile drape was applied covering the operative field. A sterile gown and sterile gloves were used for the procedure. Local anesthesia was provided with 1% Lidocaine. CT was performed in a supine position. After localizing the most appropriate target for pleural drainage, an 18 gauge trocar needle was advanced under CT guidance into the left pleural space. Fluid was aspirated. A guidewire was then advanced through the needle and the needle removed. The percutaneous tract was dilated over the guidewire. A 12 French pigtail drainage catheter was then advanced over the wire. After the catheter was formed, additional CT images were performed. The catheter was then connected to a [place] Pleur-Evac device. COMPLICATIONS: None FINDINGS: The largest loculation of residual left pleural fluid is in the lateral superior hemithorax. Aspiration yielded clear, yellowish fluid. Thorax. After pleural drain placement, there is return of yellowish colored clear fluid. IMPRESSION: CT-guided left chest thoracostomy tube placement with placement of 12 French drain into the left pleural space. The site of loculated fluid in the superior and lateral aspect of the hemithorax was targeted for catheter placement. Electronically Signed   By: Aletta Edouard M.D.   On: 04/05/2016 15:54    Starlin Steib, DO  [facility] Pager [phone]  If 7PM-7AM, please contact night-coverage [email or web address] Password [email or web address] 04/19/2016, 5:57 PM   LOS: 16 days

## 2016-04-19 NOTE — Consult Note (Signed)
   [facility] CM Inpatient Consult   04/19/2016  [name] 02/03/1931 [id number]   [facility] Care Management follow up. Chart reviewed. Spoke with inpatient RNCM about re-referring to [place] Management program if patient and family are agreeable or have changed their mind to accept [place] Management program services. Family declined [place] Management services on 04/07/16 during bedside encounter.   Marthenia Rolling, MSN-Ed, RN,BSN [facility] Liaison [phone]

## 2016-04-19 NOTE — Progress Notes (Signed)
Physical Therapy Treatment Patient Details Name: Ricardo Watkins MRN: [id number] DOB: 12-Jul-1930 Today's Date: 04/19/2016    History of Present Illness 82 y.o. male with medical history significant of non-Hodgkin's lymphoma, CAD, type 2 diabetes, hyperlipidemia, hypertension, anemia, prostate CA, diverticulosis, cataracts, DVT, chronic cough, chronic left pleural effusion with insertion of left pleural catheter on 11/10/2015 by [name] who comes to the hospital due to fever, chills, worsening fatigue and malaise.  Patient s/p removal of pleural draingae catheter and insertion of chest tube Lt. on 04/08/16.    PT Comments    Pt continues slow progression with slight increase in gait distance and able to maintain sats 90-92% on 2L, HR 101 throughout session. Cues for breathing technique, transfers, RW use and positioning and HEP provided throughout with family present. Will continue to follow.   Follow Up Recommendations  Home health PT;Supervision/Assistance - 24 hour     Equipment Recommendations  None recommended by PT    Recommendations for Other Services       Precautions / Restrictions Precautions Precautions: Fall Precaution Comments: Chest tube left    Mobility  Bed Mobility Overal bed mobility: Needs Assistance Bed Mobility: Sit to Supine       Sit to supine: Min assist   General bed mobility comments: assist to elevate legs onto surface  Transfers Overall transfer level: Needs assistance   Transfers: Sit to/from Stand Sit to Stand: Supervision         General transfer comment: cues for hand placement and backing fully to surface as well as for RW position   Ambulation/Gait Ambulation/Gait assistance: Min assist Ambulation Distance (Feet): 260 Feet Assistive device: Rolling walker (2 wheeled) Gait Pattern/deviations: Step-through pattern;Decreased stride length;Trunk flexed   Gait velocity interpretation: Below normal speed for age/gender General  Gait Details: cues for posture, position in RW and breathing technique   Stairs            Wheelchair Mobility    Modified Rankin (Stroke Patients Only)       Balance     Sitting balance-Leahy Scale: Good       Standing balance-Leahy Scale: Poor                      Cognition Arousal/Alertness: Awake/alert Behavior During Therapy: Flat affect Overall Cognitive Status: Within Functional Limits for tasks assessed                      Exercises General Exercises - Lower Extremity Long Arc Quad: AROM;Both;Seated;20 reps Hip Flexion/Marching: AROM;Both;Seated;10 reps    General Comments        Pertinent Vitals/Pain Pain Assessment: No/denies pain    Home Living                      Prior Function            PT Goals (current goals can now be found in the care plan section) Progress towards PT goals: Progressing toward goals    Frequency           PT Plan Current plan remains appropriate    Co-evaluation             End of Session Equipment Utilized During Treatment: Oxygen Activity Tolerance: Patient tolerated treatment well Patient left: in bed;with call bell/phone within reach;with family/visitor present     Time: 1540-0867 PT Time Calculation (min) (ACUTE ONLY): 20 min  Charges:  $Gait Training: 8-22  mins                    G Codes:      Melford Aase 04-22-16, 1:00 PM  Elwyn Reach, [place]

## 2016-04-20 ENCOUNTER — Inpatient Hospital Stay (HOSPITAL_COMMUNITY): Payer: Medicare Other

## 2016-04-20 LAB — BASIC METABOLIC PANEL
ANION GAP: 9 (ref 5–15)
BUN: 22 mg/dL — AB (ref 6–20)
CO2: 32 mmol/L (ref 22–32)
Calcium: 7.9 mg/dL — ABNORMAL LOW (ref 8.9–10.3)
Chloride: 96 mmol/L — ABNORMAL LOW (ref 101–111)
Creatinine, Ser: 2.02 mg/dL — ABNORMAL HIGH (ref 0.61–1.24)
GFR, EST AFRICAN AMERICAN: 33 mL/min — AB (ref >60)
GFR, EST NON AFRICAN AMERICAN: 29 mL/min — AB (ref >60)
Glucose, Bld: 93 mg/dL (ref 65–99)
POTASSIUM: 4.3 mmol/L (ref 3.5–5.1)
SODIUM: 137 mmol/L (ref 135–145)

## 2016-04-20 LAB — GLUCOSE, CAPILLARY
GLUCOSE-CAPILLARY: 85 mg/dL (ref 65–99)
Glucose-Capillary: 188 mg/dL — ABNORMAL HIGH (ref 65–99)

## 2016-04-20 LAB — MAGNESIUM: MAGNESIUM: 2.1 mg/dL (ref 1.7–2.4)

## 2016-04-20 MED ORDER — AMOXICILLIN-POT CLAVULANATE 875-125 MG PO TABS: 1.0000 | ORAL_TABLET | Freq: Two times a day (BID) | ORAL | 0 refills | Status: DC

## 2016-04-20 MED ORDER — FUROSEMIDE 40 MG PO TABS: 40.0000 mg | ORAL_TABLET | Freq: Every day | ORAL | 1 refills | Status: DC

## 2016-04-20 NOTE — Progress Notes (Signed)
Order received to discharge patient.  Patient expresses readiness to discharge.  Telemetry monitor removed and CCMD notified.  PIV access removed without complication.  Discharge instruction, follow up, medications and instructions for their use were discussed with patient and daughter and they voiced understanding.

## 2016-04-20 NOTE — Progress Notes (Signed)
      [address]       [place] [address]             [phone]      12 Days Post-Op Procedure(s) (LRB): REMOVAL OF PLEURAL DRAINAGE CATHETER (Left) CHEST TUBE INSERTION (Left) Subjective: Sleeping. No issues overnight.   Objective: Vital signs in last 24 hours: Temp:  [97.4 F (36.3 C)-98.2 F (36.8 C)] 97.4 F (36.3 C) (10/31 0500) Pulse Rate:  [81-100] 81 (10/31 0500) Cardiac Rhythm: Normal sinus rhythm (10/30 1900) Resp:  [18] 18 (10/31 0500) BP: (127-130)/(55-60) 127/55 (10/31 0500) SpO2:  [97 %-99 %] 99 % (10/31 0500) Weight:  [183 lb 10.3 oz (83.3 kg)] 183 lb 10.3 oz (83.3 kg) (10/31 0500)     Intake/Output from previous day: 10/30 0701 - 10/31 0700 In: -  Out: 1540 [Urine:1275] Intake/Output this shift: No intake/output data recorded.  General appearance: cooperative Heart: regular rate and rhythm Lungs: clear to auscultation bilaterally Abdomen: soft, non-tender; bowel sounds normal; no masses,  no organomegaly Extremities: extremities normal, atraumatic, no cyanosis or edema Wound: clean and dry  Lab Results:  Recent Labs  04/18/16 0211 04/19/16 0154  WBC 10.4 8.8  HGB 7.3* 8.0*  HCT 25.4* 27.4*  PLT 63* 63*   BMET:  Recent Labs  04/19/16 0154 04/20/16 0152  NA 137 137  K 3.8 4.3  CL 98* 96*  CO2 32 32  GLUCOSE 106* 93  BUN 18 22*  CREATININE 1.90* 2.02*  CALCIUM 7.7* 7.9*    PT/INR: No results for input(s): LABPROT, INR in the last 72 hours. ABG No results found for: PHART, HCO3, TCO2, ACIDBASEDEF, O2SAT CBG (last 3)   Recent Labs  04/19/16 1613 04/19/16 2003 04/20/16 0603  GLUCAP 161* 85 85    Assessment/Plan: S/P Procedure(s) (LRB): REMOVAL OF PLEURAL DRAINAGE CATHETER (Left) CHEST TUBE INSERTION (Left)  1. CV - On Toprol XL 100 mg daily, NSR 80s 2. Pulmonary - On 2 liters via [place]. Chest tube removed yesterday. CXR from this morning showed left pleural effusion vs consolidation on the left.  3.  Anemia-H and H stable at 8.0and 27.4. On ferrous sulfate. 4. MDS-on Promacta.Thrombocytopenia-platelets 63,000, stable 5. Acute on chronic renal failures (CKD stage III)-Creatinine stable at 2.02. Making excellent urine.  6. ID-on Augmentin for coag neg staph  Plan: Medical care per primary.    LOS: 17 days    Elgie Collard 04/20/2016

## 2016-04-20 NOTE — Progress Notes (Signed)
SATURATION QUALIFICATIONS: (This note is used to comply with regulatory documentation for home oxygen)  Patient Saturations on Room Air at Rest = 92%  Patient Saturations on Room Air while Ambulating = 87%  Patient Saturations on 2 Liters of oxygen while Ambulating = 92%  Please briefly explain why patient needs home oxygen: Patient's O2 saturation dropped to 87 at approximately 150-200 feet on Room Air.

## 2016-04-20 NOTE — Progress Notes (Signed)
Physician Discharge Summary  Ricardo Watkins CNO:[id number] DOB: 11-Sep-1930 DOA: 04/03/2016  PCP: [name], MD  Admit date: 04/03/2016 Discharge date: 04/20/2016  Admitted From: Home Disposition:  Home  Recommendations for Outpatient Follow-up:  1. Follow up with PCP in 1-2 weeks 2. Please obtain BMP/CBC in one week 3. Wean oxygen to off for saturation > 92%  Home Health: YES Equipment/Devices: oxygen @ 2L, HHPT  Discharge Condition:stable CODE STATUS: FULL Diet recommendation: Heart Healthy   Brief/Interim Summary: 82 y.o.malewith medical history significant of non-Hodgkin's lymphoma, CAD, type 2 diabetes, hyperlipidemia, hypertension, anemia, prostate CA, diverticulosis, cataracts, DVT, chronic cough, chronic left pleural effusion with insertion of left pleural catheter on 11/10/2015 by [name] who comes to the hospital due to fever, chills, worsening fatigue and malaise. Per patient, his daughter and wife, his chest tube drainage has been decreasing his output for the pastseveral days and he stopped completely the day PTA. He normally has up to 500 mL of drainage every other day. Patient was worked up and found to be septic and have a loculated effusion. TCV following patient, he went to OR on 10/19 and had the non functioning catheter removed and placed a new pigtail. Patient had a second chest tube placed in an adjacent collection on 10/20 which was removed on 10/22, currently with only one chest tube in place with minimal drainage. Initial drainage cultures positive for coag negative staph sensitive to penicillins, on Zosyn.   Discharge Diagnoses:  Sepsis due to loculated Effusion/Empyema from Left Obstructed Pleural Catheter for recurrent and chronic Non Malignant Pleural Effusions with hypoxic respiratory failure - CT Scan on admission showed Moderate-sized loculated left pleural effusion collection with a PleurX drainage catheter in place.Patient was placed  on broad-spectrum antibiotics of vancomycin and Zosyn  - Sepsis physiology resolved; Old left PleurX catheter removed by TCTS10/19. Vancomycin was discontinued on 10/22, MRSAPCR negative. - IR placed CT Guided Fluid Drain by PigTail Catheter 10/16 and drained ~550 mL. Patient taken to OR 10/19 with removal of old catheter and placement of a left sided chest tube  - post op had significant leukocytosis, up to 25 >> 23.9 >>17.3>>15.5>>19.9>>15.2>>13.4>>10.5>>8.8 - removed one of thechest tubes10/22 per TCTS - Fluid collected on 10/19 during surgery grew coagulase-negative staph which is pansensitive.  -10/27--d/c zosyn, start augmentin--plan 28 days of abx total (start date 10/14)--last day of abx 05/01/16  Acute respiratory failure with hypoxia -secondary to empyema and CHF -04/14/16 CXR--increase pulm vasc congestion -04/15/16 CXR personally reviewd--unchanged interstitial edema -stable on 2 L  -pt will go home with oxygen 2L  Acute diastolic CHF -Daily weights--NEG 7 lbs for the admission -NEG 5.1 L with IV lasix -10/27--increase lasix to 40 mg IV bid -possible switch to po lasix on 10/31--home with lasix 40 mg po daily -Daily BMP -Echocardiogram--EF 50-55%, grade 2 DD, HK inferior wall, mild MR, AS -continue metoprolol succinate -instructed daughter about daily weights and taking extra lasix if gains 3+ lbs on consecutive days or 5+ over 3 days  Acute on chronic Renal failureCKD III - baseline Cr 1.3 - 1.7 - With increase in creatinine and his vancomycin was discontinued and his Avapro was also stopped.  -am BMP -pt now has new baseline--1.9-2.0 -serum creatinine 2.02 on day of d/c -check BMP in one week  Hx of Non-Hodgkins Lymphoma - PET-CT Scan showed Enlarging hypermetabolic mediastinal lymph nodes suggesting recurrent lymphoma. - Patient was seen by Hematologist/Oncologist [name],  -will have close outpatient follow up  -  Continue scheduled follow-ups with  Hematology/Oncology after empyema treatment.  Abdominal pain - patient withintermittent crampyabdominal pain, states that he occasionally  -Present for the past 3-4 months -suspect component of IBS -Tolerating diet - plain films normal - 04/17/2016 CT abd/pelvis--fatty liver, atrophic pancreas, sigmoid diverticulosis, no acute worsening lymphadenopathy -abdominal pain improved with diuresis--?bowel congestive edema from CHF  MDS (myelodysplastic syndrome) with 5q deletion - Followed by Hematology/Oncology; Seen by [name] today - Continue Promacta.Patient's Hematologist increased it to 75 mg and will reassess next visit.  Hypokalemia - Improved, replete prn  Insulin Dependent Type 2 Diabetes Mellitus  - Continue Lantus and SSI,  -addednovolog 2 units tiw -overall controlled -resume home dose lantus after discharge  CAD  - Continue Metoprolol Succinate 100 mg by mouth daily -Irbesartan was held due to renal function  - Clopidogrel was heldby Hematology/Oncology because of Thrombocytopenia.  Essential Hypertension - Continue Metoprolol succinate100 mg po daily -expect continued improvement with diuresis  Thrombocytopenia  - Platelet Count stable/improving - SCDs for DVT prophylaxis. - Continue Promacta. Patient's Hematologist increased it to 75 mg and will reassess next visit. - Hematology recommended stopping Clopidogrel; P2Y12 from 10/15 WNL   NormcyticAnemia - Continue Ferrous Sulfate 325 mg tablet po  -indices suggest anemia of chronic disease -baseline Hgb ~8   Discharge Instructions  Discharge Instructions    Diet - low sodium heart healthy    Complete by:  As directed    Increase activity slowly    Complete by:  As directed        Medication List    STOP taking these medications   clopidogrel 75 MG tablet Commonly known as:  PLAVIX   cyanocobalamin 1000 MCG/ML injection Commonly known as:  (VITAMIN B-12)   doxazosin 4 MG  tablet Commonly known as:  CARDURA   valsartan 160 MG tablet Commonly known as:  DIOVAN     TAKE these medications   acetaminophen 500 MG tablet Commonly known as:  TYLENOL Take 1,000 mg by mouth every 6 (six) hours as needed for mild pain, moderate pain or headache.   amoxicillin-clavulanate 875-125 MG tablet Commonly known as:  AUGMENTIN Take 1 tablet by mouth every 12 (twelve) hours.   DELSYM PO Take 15 mLs by mouth at bedtime.   eltrombopag 75 MG tablet Commonly known as:  PROMACTA Take 1 tablet (75 mg total) by mouth daily. Take on an empty stomach 1 hour before a meal or 2 hours after What changed:  when to take this  additional instructions   ferrous sulfate 325 (65 FE) MG tablet Take 325 mg by mouth daily after supper.   fluticasone 50 MCG/ACT nasal spray Commonly known as:  FLONASE Place 1 spray into both nostrils at bedtime.   furosemide 40 MG tablet Commonly known as:  LASIX Take 1 tablet (40 mg total) by mouth daily. Start taking on:  04/21/2016   glucose blood test strip 1 each by Other route daily. Fasting each morning   hydrocortisone cream 1 % Apply 1 application topically at bedtime as needed for itching.   Insulin Glargine 100 UNIT/ML Solostar Pen Commonly known as:  LANTUS SOLOSTAR INJECT 15 UNITS INTO THE SKIN EACH MORNING What changed:  how much to take  how to take this  when to take this  additional instructions   meclizine 12.5 MG tablet Commonly known as:  ANTIVERT Take 1 tablet (12.5 mg total) by mouth 3 (three) times daily as needed for dizziness.   metoCLOPramide 5 MG  tablet Commonly known as:  REGLAN Take 1 tablet (5 mg total) by mouth 3 (three) times daily before meals. What changed:  when to take this  reasons to take this   metoprolol succinate 100 MG 24 hr tablet Commonly known as:  TOPROL-XL TAKE 1 TABLET BY MOUTH ONCE A DAY   multivitamin with minerals Tabs tablet Take 1 tablet by mouth at bedtime.   OVER  THE COUNTER MEDICATION Place 1 drop into both eyes daily as needed (dry eyes). Over the counter lubricating eye drop      Follow-up Information    Tharon Aquas Adelene Idler, MD .   Specialty:  Cardiothoracic Surgery Why:  Appointment at 2:00pm on 05/05/2016. Please arrive at 1:30pm for a chest xray at [facility]. They are located on the first floor of our building. Contact information: [address] [place]  [address] [phone]          Allergies  Allergen Reactions  . Glucophage [Metformin Hydrochloride] Other (See Comments)    Chest pain  . Zetia [Ezetimibe] Other (See Comments)    weakness  . Fenofibrate Rash  . Niacin-Lovastatin Er Rash    Consultations:  TCTS  Pulmonology   Procedures/Studies: Ct Abdomen Pelvis Wo Contrast  Result Date: 04/18/2016 CLINICAL DATA:  82 year old male with abdominal pain. History of prostate cancer and lymphoma. EXAM: CT ABDOMEN AND PELVIS WITHOUT CONTRAST TECHNIQUE: Multidetector CT imaging of the abdomen and pelvis was performed following the standard protocol without IV contrast. COMPARISON:  Chest CT dated 04/05/2016 and PETCT dated 04/02/2016 FINDINGS: Evaluation of this exam is limited in the absence of intravenous contrast. Lower chest: Partially visualized bilateral pleural effusions. There has been interval increase in the size of the pleural effusion on the right since the CT dated 04/05/2016. There is partial compressive atelectasis of the right lower lobe. Pneumonia is not excluded. Left lung base consolidative changes and airspace infiltrative disease similar to prior CT. There has been interval removal of the previously seen left sided thoracostomy tube. A loculated left subpulmonic fluid may be present. There is coronary vascular calcification. There is hypoattenuation of the cardiac blood pool suggestive of a degree of anemia. Clinical correlation is recommended. No intra-abdominal free air or free fluid.  Hepatobiliary: There is diffuse fatty infiltration of the liver. Scattered punctate calcified granuloma. No intrahepatic biliary ductal dilatation. The gallbladder appears unremarkable. Pancreas: The pancreas is somewhat atrophic. No duct dilatation or peripancreatic inflammation. Spleen: Normal in size without focal abnormality. Adrenals/Urinary Tract: The adrenal glands appear unremarkable. Bilateral renal vascular calcifications noted. A punctate nonobstructing right renal calculus. There is no hydronephrosis on either side. A 1.5 cm exophytic right renal hypodense lesion as well as a 1.5 cm left renal parenchymal interpolar hypodensity are not well characterized but likely represent cysts. The visualized ureters and urinary bladder appear unremarkable. Stomach/Bowel: There is extensive sigmoid diverticulosis with muscular hypertrophy. No definite active inflammatory changes. There is loss of fat plane between the sigmoid colon and bladder dome, likely related to chronic inflammation and adhesions. The colovesical fistula is not entirely excluded. However no gas is seen within the bladder. There is no evidence of bowel obstruction. Normal appendix. Vascular/Lymphatic: There is moderate aortoiliac atherosclerotic disease. Mild ectasia of the infrarenal abdominal aorta measuring up to 2.3 cm. Right renal artery stent noted. No portal venous gas identified. There is no adenopathy. Reproductive: Prostate brachytherapy seeds noted. Other: Large fat containing right inguinal hernia without evidence of inflammatory changes. No fluid collection.  Musculoskeletal: Mild diffuse subcutaneous stranding. There is degenerative changes of the spine with bone spurring and osteophyte formation. Multilevel facet hypertrophy as well as mild lower lumbar disc bulge. No acute fracture. No suspicious lesions. The stable sclerotic focus in the left femoral neck most likely represents a bone island. IMPRESSION: Partially visualized  bilateral pleural effusions with interval increase in the size of the right pleural effusion compared to prior study. Interval removal of the left-sided pleural drainage catheter. The visualized left lung base with appears similar to prior CT. No definite acute intra-abdominal pelvic pathology identified. No adenopathy or evidence of metastatic disease. Extensive sigmoid diverticulosis without definite active inflammatory changes. No bowel obstruction. Normal appendix. Probable adhesion of the sigmoid to the dome of the bladder without definite evidence of colovesical fistula. Electronically Signed   By: Anner Crete M.D.   On: 04/18/2016 02:20   Dg Chest 2 View  Result Date: 04/20/2016 CLINICAL DATA:  Chest tube placement. On oxygen. Diabetes. Hypertension. EXAM: CHEST  2 VIEW COMPARISON:  04/18/2016 FINDINGS: Removal of small bore left chest tube. Midline trachea. Cardiomegaly accentuated by AP portable technique. Atherosclerosis in the transverse aorta. Left pleural effusion is small to moderate and similar. Suspect minimal loculation laterally. No pneumothorax. Improved interstitial edema. Similar left and improved right base airspace disease. IMPRESSION: Interval removal of left chest tube. No pneumothorax. Persistent left pleural fluid with suggestion of loculation laterally. Similar left and improved right base airspace disease. Improved interstitial edema. Electronically Signed   By: Abigail Miyamoto M.D.   On: 04/20/2016 08:39   Dg Chest 2 View  Result Date: 04/07/2016 CLINICAL DATA:  Left-sided chest tube, weakness, history of recurrent 9 malignant left pleural effusion. Also history of lymphoma EXAM: CHEST  2 VIEW COMPARISON:  Chest x-ray of 04/06/2016 and CT chest of 04/03/2016 FINDINGS: The left chest tube remains and no residual pneumothorax is seen. Apparently loculated left pleural effusion is unchanged. The pigtail of the catheter lies just superior to the superior extent of the apparently  loculated effusion. Mild bibasilar atelectasis is present. Heart size is stable. IMPRESSION: 1. No pneumothorax is currently seen. 2. No change in left chest tube and apparently loculated left pleural effusion. Electronically Signed   By: Ivar Drape M.D.   On: 04/07/2016 13:23   Dg Chest 2 View  Result Date: 04/03/2016 CLINICAL DATA:  Sepsis.  Concern for empyema EXAM: CHEST  2 VIEW COMPARISON:  03/03/2016 FINDINGS: Loculated left pleural effusion, moderate size, thickest at the base. There is a tunneled pleural catheter at the left base with alignment that is stable from 04/02/2016. The underlying lung is partly atelectatic. Normal heart size. The lower aorta is obscured by a medial pleural disease. IMPRESSION: Moderate loculated left pleural effusion with tunneled pleural catheter. Appearance is stable compared to 04/02/2016 PET-CT. Electronically Signed   By: Monte Fantasia M.D.   On: 04/03/2016 20:58   Ct Chest W Contrast  Result Date: 04/03/2016 CLINICAL DATA:  82 year old male with fever. Patient with left pleural drainage catheter. History of lymphoma. EXAM: CT CHEST WITH CONTRAST TECHNIQUE: Multidetector CT imaging of the chest was performed during intravenous contrast administration. CONTRAST:  42mL ISOVUE-300 IOPAMIDOL (ISOVUE-300) INJECTION 61% COMPARISON:  04/02/2016 PET-CT and prior studies. FINDINGS: Cardiovascular: Upper limits normal heart size and moderate coronary artery calcifications noted. There is no evidence of thoracic aortic aneurysm. Mediastinum/Nodes: Multiple shotty and enlarged mediastinal lymph nodes are identified, the largest in the lower thoracic para-aortic region. These mediastinal lymph nodes for mildly  hypermetabolic on recent PET study suspicious for recurrent lymphoma. There is no evidence of pericardial effusion. Lungs/Pleura: A moderate loculated left pleural effusion is again identified. A left pleural catheter with tip in the posterior-inferior left pleural  space again noted. Left lower lung atelectasis identified. Mild interlobular septal thickening is noted within both lungs. No discrete mass identified. There is no evidence of pneumothorax or right pleural effusion. Upper Abdomen: No acute abnormality. Upper limits normal spleen size noted. Musculoskeletal: No acute or suspicious abnormality. Degenerative disc disease in the lower cervical spine identified. IMPRESSION: Moderate loculated left pleural effusion, with left pleural catheter tip in the posterior inferior left pleural space. Associated left lower lung atelectasis. Mediastinal lymph nodes as described, mildly hypermetabolic on recent PET study suspicious for recurrent lymphoma. Coronary artery disease. Electronically Signed   By: Margarette Canada M.D.   On: 04/03/2016 21:47   Nm Pet Image Restag (ps) Skull Base To Thigh  Result Date: 04/02/2016 CLINICAL DATA:  Subsequent treatment strategy for lymphoma. EXAM: NUCLEAR MEDICINE PET SKULL BASE TO THIGH TECHNIQUE: 12.4 mCi F-18 FDG was injected intravenously. Full-ring PET imaging was performed from the skull base to thigh after the radiotracer. CT data was obtained and used for attenuation correction and anatomic localization. FASTING BLOOD GLUCOSE:  Value: 119 mg/dl COMPARISON:  10/30/2014 FINDINGS: NECK No hypermetabolic lymph nodes in the neck. CHEST There is a moderate-sized left loculated pleural effusion. No hypermetabolism to suggest a malignant effusion. It measures as simple fluid. New line small nodules are noted along the left major fissure. These are likely lymph nodes. Is also 3 subpleural nodules in the left upper on image number 104. No obvious hypermetabolism. SUV max is 1.7. These are suspicious for lymphomatous nodules however and they are new since the prior study. Enlarging mediastinal lymph nodes are mildly hypermetabolic and suspicious for recurrent lymphoma. 5 mm pretracheal lymph node on image number 87 has an SUV max of 2.56. Multiple  small prevascular lymph nodes. The largest node measures 5 mm on image number 100 and SUV max is 2.46. 11 mm aorticopulmonary window lymph node on image number 94 has an SUV max of 3.5. Subcarinal lymph node on image number 109 measures 10 mm and has an SUV max of 3.5. Left-sided retrocrural lymph node measures 23 x 16 mm on image number 127 and has SUV max of 5.9. Right-sided periaortic lymph node on image number 121 measures 8.5 mm and SUV max is 4.1. ABDOMEN/PELVIS No abnormal hypermetabolic activity within the liver, pancreas, adrenal glands, or spleen. No hypermetabolic lymph nodes in the abdomen or pelvis. Stable advanced atherosclerotic calcifications involving the aorta and branch vessels. The right renal artery stent is noted. Brachytherapy seeds are noted the prostate gland. There is a large right inguinal hernia containing fat. SKELETON No focal hypermetabolic activity to suggest skeletal metastasis. IMPRESSION: 1. Enlarging hypermetabolic mediastinal lymph nodes suggesting recurrent lymphoma. 2. New nodules along the left major fissure and in the left upper lobe subpleural space worrisome for lymphomatous involvement. 3. Moderate-sized loculated left pleural effusion collection with a PleurX drainage catheter in place. 4. No findings for adenopathy in the neck, abdomen or pelvis. Electronically Signed   By: Marijo Sanes M.D.   On: 04/02/2016 14:52   Dg Chest Port 1 View  Result Date: 04/18/2016 CLINICAL DATA:  Pleural effusion EXAM: PORTABLE CHEST 1 VIEW COMPARISON:  04/15/2016 chest radiograph. FINDINGS: Left chest tube terminates at the far periphery of the left upper pleural space. Stable cardiomediastinal silhouette with  top-normal heart size and aortic atherosclerosis. No pneumothorax. Small to moderate left pleural effusion is stable. Small right pleural effusion is stable. Patchy opacities throughout both lungs, left greater than right, not appreciably changed. IMPRESSION: 1. Small to  moderate left pleural effusion, stable. Left chest tube terminates at the far periphery of the left upper pleural space. No pneumothorax. 2. Small right pleural effusion, stable . 3. Patchy bilateral lung opacities, left greater than right, not appreciably changed, differential includes multilobar infection and/or edema. 4. Aortic atherosclerosis. Electronically Signed   By: Ilona Sorrel M.D.   On: 04/18/2016 08:34   Dg Chest Port 1 View  Result Date: 04/15/2016 CLINICAL DATA:  Chest 2 present. Shortness of breath. Pulmonary edema. EXAM: PORTABLE CHEST 1 VIEW COMPARISON:  1 day prior FINDINGS: Left-sided small bore chest tube is unchanged in position. Midline trachea. Cardiomegaly accentuated by AP portable technique. Apparent superior mediastinal soft tissue fullness is favored to be due to AP portable technique. Atherosclerosis in the transverse aorta. Loculated left-sided pleural effusion is small and similar given differences in technique. No pneumothorax. Interstitial edema is moderate, accentuated by diminished lung volumes today. Slight worsening left base aeration with persistent bibasilar airspace opacities. IMPRESSION: Small bore left-sided chest tube remaining in place with persistent loculated left-sided effusion. No pneumothorax. Diminished lung volumes. Persistent interstitial edema with similar right and increased left base airspace disease. Electronically Signed   By: Abigail Miyamoto M.D.   On: 04/15/2016 07:51   Dg Chest Port 1 View  Result Date: 04/14/2016 CLINICAL DATA:  Left chest tube followup EXAM: PORTABLE CHEST 1 VIEW COMPARISON:  04/12/2016 FINDINGS: Left-sided chest tube is in place. No pneumothorax identified. The left pleural effusion is not significantly changed in volume compared with previous exam. Moderate diffuse pulmonary edema is identified which appears increased in the interval. IMPRESSION: 1. Stable appearance of left chest tube. No change in volume of left effusion. 2.  Increase in diffuse pulmonary edema pattern. Electronically Signed   By: Kerby Moors M.D.   On: 04/14/2016 08:52   Dg Chest Port 1 View  Result Date: 04/12/2016 CLINICAL DATA:  Followup left-sided pleural effusion. EXAM: PORTABLE CHEST 1 VIEW COMPARISON:  04/10/2016 FINDINGS: The left-sided pleural drainage catheter is stable. There is a persistent small to moderate-sized left pleural effusion with overlying atelectasis. Persistent central vascular congestion. IMPRESSION: Persistent left-sided pleural effusion and overlying atelectasis. Persistent central vascular congestion Electronically Signed   By: Marijo Sanes M.D.   On: 04/12/2016 09:13   Dg Chest Port 1 View  Result Date: 04/10/2016 CLINICAL DATA:  Followup for left chest tube. Left pleural effusion. EXAM: PORTABLE CHEST 1 VIEW COMPARISON:  04/09/2016 FINDINGS: Left-sided pigtail catheter is stable, projecting along the lateral left mid to upper hemi thorax. There is persistent left pleural fluid and left lung base parenchymal opacity. No new lung abnormalities. No pneumothorax. IMPRESSION: 1. No change from the previous day's study. 2. Stable left chest tube.  No pneumothorax. 3. Persistent left lung base opacity with an associated pleural effusion. Electronically Signed   By: Lajean Manes M.D.   On: 04/10/2016 07:51   Dg Chest Port 1 View  Result Date: 04/09/2016 CLINICAL DATA:  Soreness in chest.  Chest tube present. EXAM: PORTABLE CHEST 1 VIEW COMPARISON:  04/08/2016.  04/03/2016. FINDINGS: Again noted is a left basilar chest tube and a small bore pigtail chest tube in the lateral upper chest. There continues to be evidence for a loculated left pleural fluid which is caudal to  the pigtail catheter. Evidence for compressive atelectasis at the left lung base. Slightly increased densities in the right lower lung. Heart size is stable. Trachea is midline. Negative for pneumothorax. IMPRESSION: No change in the loculated left pleural  effusion. Stable position of the left chest tubes without a pneumothorax. Electronically Signed   By: Markus Daft M.D.   On: 04/09/2016 08:56   Dg Chest Port 1 View  Result Date: 04/08/2016 CLINICAL DATA:  Shortness of breath. EXAM: PORTABLE CHEST 1 VIEW COMPARISON:  04/08/2016.  04/07/2016. FINDINGS: PleurX catheter again noted on the left in stable position. No pneumothorax. Cardiomegaly with bilateral pulmonary interstitial prominence and left-sided pleural effusion noted consistent congestive heart failure. Similar finding noted on prior exam from earlier today. IMPRESSION: 1. Left chest tube noted in stable position. No pneumothorax. Stable left pleural effusion. 2. Persistent changes of congestive heart failure with pulmonary interstitial edema. No interim change from earlier exam today. Electronically Signed   By: Marcello Moores  Register   On: 04/08/2016 13:36   Dg Chest Portable 1 View  Result Date: 04/08/2016 CLINICAL DATA:  PleurX catheter placement. EXAM: PORTABLE CHEST 1 VIEW COMPARISON:  04/07/2016. FINDINGS: PleurX catheter on the left again noted. Small left pleural effusion again noted. No pneumothorax . Cardiomegaly with pulmonary vascular prominence and mild interstitial prominence noted. Mild component congestive heart failure cannot be excluded. IMPRESSION: 1. PleurX catheter on the left again noted. No pneumothorax. Small left pleural effusion again noted. 2. Cannot exclude mild congestive heart failure with mild interstitial edema. Electronically Signed   By: Marcello Moores  Register   On: 04/08/2016 12:31   Dg Chest Port 1 View  Addendum Date: 04/07/2016   ADDENDUM REPORT: 04/07/2016 08:27 ADDENDUM: Critical Value/emergent results were called by telephone at the time of interpretation on 04/07/2016 at 8:27 am to the patient's interventional radiologist, who verbally acknowledged these results. Electronically Signed   By: Marcello Moores  Register   On: 04/07/2016 08:27   Result Date:  04/07/2016 CLINICAL DATA:  Shortness of breath. EXAM: PORTABLE CHEST 1 VIEW COMPARISON:  CT 04/05/2016. FINDINGS: Left chest tube noted. Previously identified large left left up pleural effusion is been removed. Residual left lower pleural effusion. Tiny left pneumothorax present. This was discussed with the patient's interventional radiologist. Stable cardiomegaly. No acute bony abnormality. IMPRESSION: Left chest tube noted in good anatomic position. Significant resolution of left-sided pneumothorax. Residual left lower pleural effusion present. Miniscule left pneumothorax noted. Electronically Signed: By: Marcello Moores  Register On: 04/06/2016 16:30   Dg Chest Port 1 View  Result Date: 04/05/2016 CLINICAL DATA:  Preprocedural evaluation for pleural drainage catheter. EXAM: PORTABLE CHEST 1 VIEW COMPARISON:  CT 04/03/2016.  Chest x-ray 04/03/2016 . FINDINGS: Progressive left pleural effusion noted. Some degree of loculation may be present. Mild cardiomegaly with mild interstitial prominence, a component of mild congestive heart failure cannot be excluded. No pneumothorax . IMPRESSION: 1. Progressive left pleural effusion with possible loculation. 2. Cardiomegaly with mild interstitial prominence. A component of mild congestive heart failure cannot be excluded . Electronically Signed   By: Marcello Moores  Register   On: 04/05/2016 08:01   Dg Abd Portable 2v  Result Date: 04/10/2016 CLINICAL DATA:  Abdominal pain EXAM: PORTABLE ABDOMEN - 2 VIEW COMPARISON:  None. FINDINGS: Scattered large and small bowel gas is noted. A left thoracostomy catheter is noted over the base. No obstructive changes are seen. No free air is noted. Mild degenerative changes of the lumbar spine are seen. IMPRESSION: No acute abnormality noted. Electronically Signed  By: Inez Catalina M.D.   On: 04/10/2016 08:58   Ct Image Guided Fluid Drain By Catheter  Result Date: 04/05/2016 CLINICAL DATA:  Lymphoma, and left-sided empyema with residual  loculated left pleural fluid remaining. Request has been made to place a percutaneous pleural drainage catheter. EXAM: CT GUIDED LEFT THORACOSTOMY TUBE PLACEMENT FOR EMPYEMA ANESTHESIA/SEDATION: 1.0 mg IV Versed 25 mcg IV Fentanyl Total Moderate Sedation Time:  20 minutes The patient's level of consciousness and physiologic status were continuously monitored during the procedure by Radiology nursing. PROCEDURE: The procedure, risks, benefits, and alternatives were explained to the patient's wife. Questions regarding the procedure were encouraged and answered. The patient's wife understands and consents to the procedure. A time out was performed prior to initiating the procedure. The left upper chest wall was prepped with chlorhexidine in a sterile fashion, and a sterile drape was applied covering the operative field. A sterile gown and sterile gloves were used for the procedure. Local anesthesia was provided with 1% Lidocaine. CT was performed in a supine position. After localizing the most appropriate target for pleural drainage, an 18 gauge trocar needle was advanced under CT guidance into the left pleural space. Fluid was aspirated. A guidewire was then advanced through the needle and the needle removed. The percutaneous tract was dilated over the guidewire. A 12 French pigtail drainage catheter was then advanced over the wire. After the catheter was formed, additional CT images were performed. The catheter was then connected to a [place] Pleur-Evac device. COMPLICATIONS: None FINDINGS: The largest loculation of residual left pleural fluid is in the lateral superior hemithorax. Aspiration yielded clear, yellowish fluid. Thorax. After pleural drain placement, there is return of yellowish colored clear fluid. IMPRESSION: CT-guided left chest thoracostomy tube placement with placement of 12 French drain into the left pleural space. The site of loculated fluid in the superior and lateral aspect of the hemithorax was  targeted for catheter placement. Electronically Signed   By: Aletta Edouard M.D.   On: 04/05/2016 15:54        Discharge Exam: Vitals:   04/19/16 2002 04/20/16 0500  BP: (!) 128/59 (!) 127/55  Pulse: 89 81  Resp: 18 18  Temp: 98.2 F (36.8 C) 97.4 F (36.3 C)   Vitals:   04/19/16 0553 04/19/16 1352 04/19/16 2002 04/20/16 0500  BP:  130/60 (!) 128/59 (!) 127/55  Pulse:  100 89 81  Resp:  18 18 18   Temp:  97.6 F (36.4 C) 98.2 F (36.8 C) 97.4 F (36.3 C)  TempSrc:  Oral Oral Axillary  SpO2:  97% 98% 99%  Weight: 86.5 kg (190 lb 11.2 oz)   83.3 kg (183 lb 10.3 oz)  Height:        General: Pt is alert, awake, not in acute distress Cardiovascular: RRR, S1/S2 +, no rubs, no gallops Respiratory: CTA bilaterally, no wheezing, no rhonchi Abdominal: Soft, NT, ND, bowel sounds + Extremities: no edema, no cyanosis   The results of significant diagnostics from this hospitalization (including imaging, microbiology, ancillary and laboratory) are listed below for reference.    Significant Diagnostic Studies: Ct Abdomen Pelvis Wo Contrast  Result Date: 04/18/2016 CLINICAL DATA:  82 year old male with abdominal pain. History of prostate cancer and lymphoma. EXAM: CT ABDOMEN AND PELVIS WITHOUT CONTRAST TECHNIQUE: Multidetector CT imaging of the abdomen and pelvis was performed following the standard protocol without IV contrast. COMPARISON:  Chest CT dated 04/05/2016 and PETCT dated 04/02/2016 FINDINGS: Evaluation of this exam is limited in the  absence of intravenous contrast. Lower chest: Partially visualized bilateral pleural effusions. There has been interval increase in the size of the pleural effusion on the right since the CT dated 04/05/2016. There is partial compressive atelectasis of the right lower lobe. Pneumonia is not excluded. Left lung base consolidative changes and airspace infiltrative disease similar to prior CT. There has been interval removal of the previously seen  left sided thoracostomy tube. A loculated left subpulmonic fluid may be present. There is coronary vascular calcification. There is hypoattenuation of the cardiac blood pool suggestive of a degree of anemia. Clinical correlation is recommended. No intra-abdominal free air or free fluid. Hepatobiliary: There is diffuse fatty infiltration of the liver. Scattered punctate calcified granuloma. No intrahepatic biliary ductal dilatation. The gallbladder appears unremarkable. Pancreas: The pancreas is somewhat atrophic. No duct dilatation or peripancreatic inflammation. Spleen: Normal in size without focal abnormality. Adrenals/Urinary Tract: The adrenal glands appear unremarkable. Bilateral renal vascular calcifications noted. A punctate nonobstructing right renal calculus. There is no hydronephrosis on either side. A 1.5 cm exophytic right renal hypodense lesion as well as a 1.5 cm left renal parenchymal interpolar hypodensity are not well characterized but likely represent cysts. The visualized ureters and urinary bladder appear unremarkable. Stomach/Bowel: There is extensive sigmoid diverticulosis with muscular hypertrophy. No definite active inflammatory changes. There is loss of fat plane between the sigmoid colon and bladder dome, likely related to chronic inflammation and adhesions. The colovesical fistula is not entirely excluded. However no gas is seen within the bladder. There is no evidence of bowel obstruction. Normal appendix. Vascular/Lymphatic: There is moderate aortoiliac atherosclerotic disease. Mild ectasia of the infrarenal abdominal aorta measuring up to 2.3 cm. Right renal artery stent noted. No portal venous gas identified. There is no adenopathy. Reproductive: Prostate brachytherapy seeds noted. Other: Large fat containing right inguinal hernia without evidence of inflammatory changes. No fluid collection. Musculoskeletal: Mild diffuse subcutaneous stranding. There is degenerative changes of the  spine with bone spurring and osteophyte formation. Multilevel facet hypertrophy as well as mild lower lumbar disc bulge. No acute fracture. No suspicious lesions. The stable sclerotic focus in the left femoral neck most likely represents a bone island. IMPRESSION: Partially visualized bilateral pleural effusions with interval increase in the size of the right pleural effusion compared to prior study. Interval removal of the left-sided pleural drainage catheter. The visualized left lung base with appears similar to prior CT. No definite acute intra-abdominal pelvic pathology identified. No adenopathy or evidence of metastatic disease. Extensive sigmoid diverticulosis without definite active inflammatory changes. No bowel obstruction. Normal appendix. Probable adhesion of the sigmoid to the dome of the bladder without definite evidence of colovesical fistula. Electronically Signed   By: Anner Crete M.D.   On: 04/18/2016 02:20   Dg Chest 2 View  Result Date: 04/20/2016 CLINICAL DATA:  Chest tube placement. On oxygen. Diabetes. Hypertension. EXAM: CHEST  2 VIEW COMPARISON:  04/18/2016 FINDINGS: Removal of small bore left chest tube. Midline trachea. Cardiomegaly accentuated by AP portable technique. Atherosclerosis in the transverse aorta. Left pleural effusion is small to moderate and similar. Suspect minimal loculation laterally. No pneumothorax. Improved interstitial edema. Similar left and improved right base airspace disease. IMPRESSION: Interval removal of left chest tube. No pneumothorax. Persistent left pleural fluid with suggestion of loculation laterally. Similar left and improved right base airspace disease. Improved interstitial edema. Electronically Signed   By: Abigail Miyamoto M.D.   On: 04/20/2016 08:39   Dg Chest 2 View  Result Date: 04/07/2016 CLINICAL  DATA:  Left-sided chest tube, weakness, history of recurrent 9 malignant left pleural effusion. Also history of lymphoma EXAM: CHEST  2 VIEW  COMPARISON:  Chest x-ray of 04/06/2016 and CT chest of 04/03/2016 FINDINGS: The left chest tube remains and no residual pneumothorax is seen. Apparently loculated left pleural effusion is unchanged. The pigtail of the catheter lies just superior to the superior extent of the apparently loculated effusion. Mild bibasilar atelectasis is present. Heart size is stable. IMPRESSION: 1. No pneumothorax is currently seen. 2. No change in left chest tube and apparently loculated left pleural effusion. Electronically Signed   By: Ivar Drape M.D.   On: 04/07/2016 13:23   Dg Chest 2 View  Result Date: 04/03/2016 CLINICAL DATA:  Sepsis.  Concern for empyema EXAM: CHEST  2 VIEW COMPARISON:  03/03/2016 FINDINGS: Loculated left pleural effusion, moderate size, thickest at the base. There is a tunneled pleural catheter at the left base with alignment that is stable from 04/02/2016. The underlying lung is partly atelectatic. Normal heart size. The lower aorta is obscured by a medial pleural disease. IMPRESSION: Moderate loculated left pleural effusion with tunneled pleural catheter. Appearance is stable compared to 04/02/2016 PET-CT. Electronically Signed   By: Monte Fantasia M.D.   On: 04/03/2016 20:58   Ct Chest W Contrast  Result Date: 04/03/2016 CLINICAL DATA:  82 year old male with fever. Patient with left pleural drainage catheter. History of lymphoma. EXAM: CT CHEST WITH CONTRAST TECHNIQUE: Multidetector CT imaging of the chest was performed during intravenous contrast administration. CONTRAST:  53mL ISOVUE-300 IOPAMIDOL (ISOVUE-300) INJECTION 61% COMPARISON:  04/02/2016 PET-CT and prior studies. FINDINGS: Cardiovascular: Upper limits normal heart size and moderate coronary artery calcifications noted. There is no evidence of thoracic aortic aneurysm. Mediastinum/Nodes: Multiple shotty and enlarged mediastinal lymph nodes are identified, the largest in the lower thoracic para-aortic region. These mediastinal lymph  nodes for mildly hypermetabolic on recent PET study suspicious for recurrent lymphoma. There is no evidence of pericardial effusion. Lungs/Pleura: A moderate loculated left pleural effusion is again identified. A left pleural catheter with tip in the posterior-inferior left pleural space again noted. Left lower lung atelectasis identified. Mild interlobular septal thickening is noted within both lungs. No discrete mass identified. There is no evidence of pneumothorax or right pleural effusion. Upper Abdomen: No acute abnormality. Upper limits normal spleen size noted. Musculoskeletal: No acute or suspicious abnormality. Degenerative disc disease in the lower cervical spine identified. IMPRESSION: Moderate loculated left pleural effusion, with left pleural catheter tip in the posterior inferior left pleural space. Associated left lower lung atelectasis. Mediastinal lymph nodes as described, mildly hypermetabolic on recent PET study suspicious for recurrent lymphoma. Coronary artery disease. Electronically Signed   By: Margarette Canada M.D.   On: 04/03/2016 21:47   Nm Pet Image Restag (ps) Skull Base To Thigh  Result Date: 04/02/2016 CLINICAL DATA:  Subsequent treatment strategy for lymphoma. EXAM: NUCLEAR MEDICINE PET SKULL BASE TO THIGH TECHNIQUE: 12.4 mCi F-18 FDG was injected intravenously. Full-ring PET imaging was performed from the skull base to thigh after the radiotracer. CT data was obtained and used for attenuation correction and anatomic localization. FASTING BLOOD GLUCOSE:  Value: 119 mg/dl COMPARISON:  10/30/2014 FINDINGS: NECK No hypermetabolic lymph nodes in the neck. CHEST There is a moderate-sized left loculated pleural effusion. No hypermetabolism to suggest a malignant effusion. It measures as simple fluid. New line small nodules are noted along the left major fissure. These are likely lymph nodes. Is also 3 subpleural nodules in the  left upper on image number 104. No obvious hypermetabolism. SUV  max is 1.7. These are suspicious for lymphomatous nodules however and they are new since the prior study. Enlarging mediastinal lymph nodes are mildly hypermetabolic and suspicious for recurrent lymphoma. 5 mm pretracheal lymph node on image number 87 has an SUV max of 2.56. Multiple small prevascular lymph nodes. The largest node measures 5 mm on image number 100 and SUV max is 2.46. 11 mm aorticopulmonary window lymph node on image number 94 has an SUV max of 3.5. Subcarinal lymph node on image number 109 measures 10 mm and has an SUV max of 3.5. Left-sided retrocrural lymph node measures 23 x 16 mm on image number 127 and has SUV max of 5.9. Right-sided periaortic lymph node on image number 121 measures 8.5 mm and SUV max is 4.1. ABDOMEN/PELVIS No abnormal hypermetabolic activity within the liver, pancreas, adrenal glands, or spleen. No hypermetabolic lymph nodes in the abdomen or pelvis. Stable advanced atherosclerotic calcifications involving the aorta and branch vessels. The right renal artery stent is noted. Brachytherapy seeds are noted the prostate gland. There is a large right inguinal hernia containing fat. SKELETON No focal hypermetabolic activity to suggest skeletal metastasis. IMPRESSION: 1. Enlarging hypermetabolic mediastinal lymph nodes suggesting recurrent lymphoma. 2. New nodules along the left major fissure and in the left upper lobe subpleural space worrisome for lymphomatous involvement. 3. Moderate-sized loculated left pleural effusion collection with a PleurX drainage catheter in place. 4. No findings for adenopathy in the neck, abdomen or pelvis. Electronically Signed   By: Marijo Sanes M.D.   On: 04/02/2016 14:52   Dg Chest Port 1 View  Result Date: 04/18/2016 CLINICAL DATA:  Pleural effusion EXAM: PORTABLE CHEST 1 VIEW COMPARISON:  04/15/2016 chest radiograph. FINDINGS: Left chest tube terminates at the far periphery of the left upper pleural space. Stable cardiomediastinal  silhouette with top-normal heart size and aortic atherosclerosis. No pneumothorax. Small to moderate left pleural effusion is stable. Small right pleural effusion is stable. Patchy opacities throughout both lungs, left greater than right, not appreciably changed. IMPRESSION: 1. Small to moderate left pleural effusion, stable. Left chest tube terminates at the far periphery of the left upper pleural space. No pneumothorax. 2. Small right pleural effusion, stable . 3. Patchy bilateral lung opacities, left greater than right, not appreciably changed, differential includes multilobar infection and/or edema. 4. Aortic atherosclerosis. Electronically Signed   By: Ilona Sorrel M.D.   On: 04/18/2016 08:34   Dg Chest Port 1 View  Result Date: 04/15/2016 CLINICAL DATA:  Chest 2 present. Shortness of breath. Pulmonary edema. EXAM: PORTABLE CHEST 1 VIEW COMPARISON:  1 day prior FINDINGS: Left-sided small bore chest tube is unchanged in position. Midline trachea. Cardiomegaly accentuated by AP portable technique. Apparent superior mediastinal soft tissue fullness is favored to be due to AP portable technique. Atherosclerosis in the transverse aorta. Loculated left-sided pleural effusion is small and similar given differences in technique. No pneumothorax. Interstitial edema is moderate, accentuated by diminished lung volumes today. Slight worsening left base aeration with persistent bibasilar airspace opacities. IMPRESSION: Small bore left-sided chest tube remaining in place with persistent loculated left-sided effusion. No pneumothorax. Diminished lung volumes. Persistent interstitial edema with similar right and increased left base airspace disease. Electronically Signed   By: Abigail Miyamoto M.D.   On: 04/15/2016 07:51   Dg Chest Port 1 View  Result Date: 04/14/2016 CLINICAL DATA:  Left chest tube followup EXAM: PORTABLE CHEST 1 VIEW COMPARISON:  04/12/2016 FINDINGS:  Left-sided chest tube is in place. No pneumothorax  identified. The left pleural effusion is not significantly changed in volume compared with previous exam. Moderate diffuse pulmonary edema is identified which appears increased in the interval. IMPRESSION: 1. Stable appearance of left chest tube. No change in volume of left effusion. 2. Increase in diffuse pulmonary edema pattern. Electronically Signed   By: Kerby Moors M.D.   On: 04/14/2016 08:52   Dg Chest Port 1 View  Result Date: 04/12/2016 CLINICAL DATA:  Followup left-sided pleural effusion. EXAM: PORTABLE CHEST 1 VIEW COMPARISON:  04/10/2016 FINDINGS: The left-sided pleural drainage catheter is stable. There is a persistent small to moderate-sized left pleural effusion with overlying atelectasis. Persistent central vascular congestion. IMPRESSION: Persistent left-sided pleural effusion and overlying atelectasis. Persistent central vascular congestion Electronically Signed   By: Marijo Sanes M.D.   On: 04/12/2016 09:13   Dg Chest Port 1 View  Result Date: 04/10/2016 CLINICAL DATA:  Followup for left chest tube. Left pleural effusion. EXAM: PORTABLE CHEST 1 VIEW COMPARISON:  04/09/2016 FINDINGS: Left-sided pigtail catheter is stable, projecting along the lateral left mid to upper hemi thorax. There is persistent left pleural fluid and left lung base parenchymal opacity. No new lung abnormalities. No pneumothorax. IMPRESSION: 1. No change from the previous day's study. 2. Stable left chest tube.  No pneumothorax. 3. Persistent left lung base opacity with an associated pleural effusion. Electronically Signed   By: Lajean Manes M.D.   On: 04/10/2016 07:51   Dg Chest Port 1 View  Result Date: 04/09/2016 CLINICAL DATA:  Soreness in chest.  Chest tube present. EXAM: PORTABLE CHEST 1 VIEW COMPARISON:  04/08/2016.  04/03/2016. FINDINGS: Again noted is a left basilar chest tube and a small bore pigtail chest tube in the lateral upper chest. There continues to be evidence for a loculated left pleural  fluid which is caudal to the pigtail catheter. Evidence for compressive atelectasis at the left lung base. Slightly increased densities in the right lower lung. Heart size is stable. Trachea is midline. Negative for pneumothorax. IMPRESSION: No change in the loculated left pleural effusion. Stable position of the left chest tubes without a pneumothorax. Electronically Signed   By: Markus Daft M.D.   On: 04/09/2016 08:56   Dg Chest Port 1 View  Result Date: 04/08/2016 CLINICAL DATA:  Shortness of breath. EXAM: PORTABLE CHEST 1 VIEW COMPARISON:  04/08/2016.  04/07/2016. FINDINGS: PleurX catheter again noted on the left in stable position. No pneumothorax. Cardiomegaly with bilateral pulmonary interstitial prominence and left-sided pleural effusion noted consistent congestive heart failure. Similar finding noted on prior exam from earlier today. IMPRESSION: 1. Left chest tube noted in stable position. No pneumothorax. Stable left pleural effusion. 2. Persistent changes of congestive heart failure with pulmonary interstitial edema. No interim change from earlier exam today. Electronically Signed   By: Marcello Moores  Register   On: 04/08/2016 13:36   Dg Chest Portable 1 View  Result Date: 04/08/2016 CLINICAL DATA:  PleurX catheter placement. EXAM: PORTABLE CHEST 1 VIEW COMPARISON:  04/07/2016. FINDINGS: PleurX catheter on the left again noted. Small left pleural effusion again noted. No pneumothorax . Cardiomegaly with pulmonary vascular prominence and mild interstitial prominence noted. Mild component congestive heart failure cannot be excluded. IMPRESSION: 1. PleurX catheter on the left again noted. No pneumothorax. Small left pleural effusion again noted. 2. Cannot exclude mild congestive heart failure with mild interstitial edema. Electronically Signed   By: Marcello Moores  Register   On: 04/08/2016 12:31   Dg  Chest Port 1 View  Addendum Date: 04/07/2016   ADDENDUM REPORT: 04/07/2016 08:27 ADDENDUM: Critical  Value/emergent results were called by telephone at the time of interpretation on 04/07/2016 at 8:27 am to the patient's interventional radiologist, who verbally acknowledged these results. Electronically Signed   By: Marcello Moores  Register   On: 04/07/2016 08:27   Result Date: 04/07/2016 CLINICAL DATA:  Shortness of breath. EXAM: PORTABLE CHEST 1 VIEW COMPARISON:  CT 04/05/2016. FINDINGS: Left chest tube noted. Previously identified large left left up pleural effusion is been removed. Residual left lower pleural effusion. Tiny left pneumothorax present. This was discussed with the patient's interventional radiologist. Stable cardiomegaly. No acute bony abnormality. IMPRESSION: Left chest tube noted in good anatomic position. Significant resolution of left-sided pneumothorax. Residual left lower pleural effusion present. Miniscule left pneumothorax noted. Electronically Signed: By: Marcello Moores  Register On: 04/06/2016 16:30   Dg Chest Port 1 View  Result Date: 04/05/2016 CLINICAL DATA:  Preprocedural evaluation for pleural drainage catheter. EXAM: PORTABLE CHEST 1 VIEW COMPARISON:  CT 04/03/2016.  Chest x-ray 04/03/2016 . FINDINGS: Progressive left pleural effusion noted. Some degree of loculation may be present. Mild cardiomegaly with mild interstitial prominence, a component of mild congestive heart failure cannot be excluded. No pneumothorax . IMPRESSION: 1. Progressive left pleural effusion with possible loculation. 2. Cardiomegaly with mild interstitial prominence. A component of mild congestive heart failure cannot be excluded . Electronically Signed   By: Marcello Moores  Register   On: 04/05/2016 08:01   Dg Abd Portable 2v  Result Date: 04/10/2016 CLINICAL DATA:  Abdominal pain EXAM: PORTABLE ABDOMEN - 2 VIEW COMPARISON:  None. FINDINGS: Scattered large and small bowel gas is noted. A left thoracostomy catheter is noted over the base. No obstructive changes are seen. No free air is noted. Mild degenerative changes  of the lumbar spine are seen. IMPRESSION: No acute abnormality noted. Electronically Signed   By: Inez Catalina M.D.   On: 04/10/2016 08:58   Ct Image Guided Fluid Drain By Catheter  Result Date: 04/05/2016 CLINICAL DATA:  Lymphoma, and left-sided empyema with residual loculated left pleural fluid remaining. Request has been made to place a percutaneous pleural drainage catheter. EXAM: CT GUIDED LEFT THORACOSTOMY TUBE PLACEMENT FOR EMPYEMA ANESTHESIA/SEDATION: 1.0 mg IV Versed 25 mcg IV Fentanyl Total Moderate Sedation Time:  20 minutes The patient's level of consciousness and physiologic status were continuously monitored during the procedure by Radiology nursing. PROCEDURE: The procedure, risks, benefits, and alternatives were explained to the patient's wife. Questions regarding the procedure were encouraged and answered. The patient's wife understands and consents to the procedure. A time out was performed prior to initiating the procedure. The left upper chest wall was prepped with chlorhexidine in a sterile fashion, and a sterile drape was applied covering the operative field. A sterile gown and sterile gloves were used for the procedure. Local anesthesia was provided with 1% Lidocaine. CT was performed in a supine position. After localizing the most appropriate target for pleural drainage, an 18 gauge trocar needle was advanced under CT guidance into the left pleural space. Fluid was aspirated. A guidewire was then advanced through the needle and the needle removed. The percutaneous tract was dilated over the guidewire. A 12 French pigtail drainage catheter was then advanced over the wire. After the catheter was formed, additional CT images were performed. The catheter was then connected to a [place] Pleur-Evac device. COMPLICATIONS: None FINDINGS: The largest loculation of residual left pleural fluid is in the lateral superior hemithorax. Aspiration yielded  clear, yellowish fluid. Thorax. After pleural  drain placement, there is return of yellowish colored clear fluid. IMPRESSION: CT-guided left chest thoracostomy tube placement with placement of 12 French drain into the left pleural space. The site of loculated fluid in the superior and lateral aspect of the hemithorax was targeted for catheter placement. Electronically Signed   By: Aletta Edouard M.D.   On: 04/05/2016 15:54     Microbiology: No results found for this or any previous visit (from the past 240 hour(s)).   Labs: Basic Metabolic Panel:  Recent Labs Lab 04/16/16 0147 04/17/16 0343 04/18/16 0211 04/19/16 0154 04/20/16 0152  NA 140 141 138 137 137  K 3.4* 3.5 3.6 3.8 4.3  CL 103 101 100* 98* 96*  CO2 32 32 31 32 32  GLUCOSE 117* 130* 121* 106* 93  BUN 18 16 16 18  22*  CREATININE 1.99* 1.95* 1.85* 1.90* 2.02*  CALCIUM 7.7* 7.5* 7.7* 7.7* 7.9*  MG  --  1.9  --   --  2.1   Liver Function Tests: No results for input(s): AST, ALT, ALKPHOS, BILITOT, PROT, ALBUMIN in the last 168 hours. No results for input(s): LIPASE, AMYLASE in the last 168 hours. No results for input(s): AMMONIA in the last 168 hours. CBC:  Recent Labs Lab 04/15/16 1422 04/16/16 0147 04/17/16 0343 04/18/16 0211 04/19/16 0154  WBC 15.2* 13.4* 10.5 10.4 8.8  HGB 7.6* 7.5* 7.7* 7.3* 8.0*  HCT 26.6* 25.5* 26.5* 25.4* 27.4*  MCV 82.1 79.9 81.0 79.6 79.2  PLT 75* 79* 67* 63* 63*   Cardiac Enzymes: No results for input(s): CKTOTAL, CKMB, CKMBINDEX, TROPONINI in the last 168 hours. BNP: Invalid input(s): POCBNP CBG:  Recent Labs Lab 04/19/16 1109 04/19/16 1613 04/19/16 2003 04/20/16 0603 04/20/16 1102  GLUCAP 224* 161* 85 85 188*    Time coordinating discharge:  Greater than 30 minutes  Signed:  Oralia Criger, DO [facility] Pager: [phone] 04/20/2016, 1:26 PM

## 2016-04-20 NOTE — Care Management Note (Signed)
Case Management Note Marvetta Gibbons RN, BSN Unit 2W-Case Manager [phone]  Patient Details  Name: Ricardo Watkins MRN: [id number] Date of Birth: 01/16/1931  Subjective/Objective:  Pt admitted with empyema and decreasing drainage from pleurx at home                  Action/Plan: PTA pt lived at home with wife and daughter support , has PleurX cath - pt was active with [facility] for HHRN/PT services- will need resumption orders for discharge for continued [facility] needs.   Expected Discharge Date:     04/20/16             Expected Discharge Plan:  [place]  In-House Referral:     Discharge planning Services  CM Consult  Post Acute Care Choice:  Home Health, Resumption of Svcs/PTA Provider, Durable Medical Equipment Choice offered to:  Patient  DME Arranged:  Oxygen DME Agency:  [place]:  PT [place]:  [place]  Status of Service:  Completed, signed off  If discussed at [place] of Stay Meetings, dates discussed:  04/15/16, 04/20/16  Additional Comments:  04/20/16- 1350- Ludean Duhart RN, CM- pt for d/c home today- pigtail CT has been removed- pt will not need HHRN- however will need HHPT- order has been placed- pt will also now need home 02- has met qualifying guidelines and order has been placed- spoke with pt at bedside- confirmed that pt still wants to use [facility] for services both [place] and DME- referral for DME- home 02 called to [facility] with [facility] - portable 02 tank to be delivered to room prior to discharge- call also made to Santiago Glad with [facility] to resume [facility] services- with HHPT only.     [name], RN 04/20/2016, 1:52 PM

## 2016-04-20 NOTE — Care Management Important Message (Signed)
Important Message  Patient Details  Name: Ricardo Watkins MRN: [id number] Date of Birth: 1930-10-23   Medicare Important Message Given:  Yes    Charlye Spare Abena 04/20/2016, 11:37 AM

## 2016-04-22 DIAGNOSIS — I130 Hypertensive heart and chronic kidney disease with heart failure and stage 1 through stage 4 chronic kidney disease, or unspecified chronic kidney disease: Secondary | ICD-10-CM | POA: Diagnosis not present

## 2016-04-22 DIAGNOSIS — Z8572 Personal history of non-Hodgkin lymphomas: Secondary | ICD-10-CM | POA: Diagnosis not present

## 2016-04-22 DIAGNOSIS — D696 Thrombocytopenia, unspecified: Secondary | ICD-10-CM | POA: Diagnosis not present

## 2016-04-22 DIAGNOSIS — E1122 Type 2 diabetes mellitus with diabetic chronic kidney disease: Secondary | ICD-10-CM | POA: Diagnosis not present

## 2016-04-22 DIAGNOSIS — Z86718 Personal history of other venous thrombosis and embolism: Secondary | ICD-10-CM | POA: Diagnosis not present

## 2016-04-22 DIAGNOSIS — Z8546 Personal history of malignant neoplasm of prostate: Secondary | ICD-10-CM | POA: Diagnosis not present

## 2016-04-22 DIAGNOSIS — Z87891 Personal history of nicotine dependence: Secondary | ICD-10-CM | POA: Diagnosis not present

## 2016-04-22 DIAGNOSIS — J90 Pleural effusion, not elsewhere classified: Secondary | ICD-10-CM | POA: Diagnosis not present

## 2016-04-22 DIAGNOSIS — I2510 Atherosclerotic heart disease of native coronary artery without angina pectoris: Secondary | ICD-10-CM | POA: Diagnosis not present

## 2016-04-22 DIAGNOSIS — K5790 Diverticulosis of intestine, part unspecified, without perforation or abscess without bleeding: Secondary | ICD-10-CM | POA: Diagnosis not present

## 2016-04-22 DIAGNOSIS — J869 Pyothorax without fistula: Secondary | ICD-10-CM | POA: Diagnosis not present

## 2016-04-22 DIAGNOSIS — D469 Myelodysplastic syndrome, unspecified: Secondary | ICD-10-CM | POA: Diagnosis not present

## 2016-04-22 DIAGNOSIS — I5031 Acute diastolic (congestive) heart failure: Secondary | ICD-10-CM | POA: Diagnosis not present

## 2016-04-22 DIAGNOSIS — Z8701 Personal history of pneumonia (recurrent): Secondary | ICD-10-CM | POA: Diagnosis not present

## 2016-04-22 DIAGNOSIS — Z794 Long term (current) use of insulin: Secondary | ICD-10-CM | POA: Diagnosis not present

## 2016-04-22 DIAGNOSIS — N183 Chronic kidney disease, stage 3 (moderate): Secondary | ICD-10-CM | POA: Diagnosis not present

## 2016-04-22 DIAGNOSIS — D509 Iron deficiency anemia, unspecified: Secondary | ICD-10-CM | POA: Diagnosis not present

## 2016-04-23 NOTE — Discharge Summary (Addendum)
Physician Discharge Summary  Ricardo Watkins RSW:[id number] DOB: Jan 26, 1931 DOA: 04/03/2016  PCP: [name], MD  Admit date: 04/03/2016 Discharge date: 04/20/2016 Admitted From: Home Disposition:  Home  Recommendations for Outpatient Follow-up:  1. Follow up with PCP in 1-2 weeks 2. Please obtain BMP/CBC in one week 3. Wean oxygen to off for saturation > 92%  Home Health: YES Equipment/Devices: oxygen @ 2L, HHPT  Discharge Condition:stable CODE STATUS: FULL Diet recommendation: Heart Healthy   Brief/Interim Summary: 82 y.o.malewith medical history significant of non-Hodgkin's lymphoma, CAD, type 2 diabetes, hyperlipidemia, hypertension, anemia, prostate CA, diverticulosis, cataracts, DVT, chronic cough, chronic left pleural effusion with insertion of left pleural catheter on 11/10/2015 by [name] who comes to the hospital due to fever, chills, worsening fatigue and malaise. Per patient, his daughter and wife, his chest tube drainage has been decreasing his output for the pastseveral days and he stopped completely the day PTA. He normally has up to 500 mL of drainage every other day. Patient was worked up and found to be septic and have a loculated effusion. TCV following patient, he went to OR on 10/19 and had the non functioning catheter removed and placed a new pigtail. Patient had a second chest tube placed in an adjacent collection on 10/20 which was removed on 10/22, currently with only one chest tube in place with minimal drainage. Initial drainage cultures positive for coag negative staph sensitive to penicillins, on Zosyn.   Discharge Diagnoses:  Sepsis due to loculated Effusion/Empyema from Left Obstructed Pleural Catheter for recurrent and chronic Non Malignant Pleural Effusions with hypoxic respiratory failure - CT Scan on admission showed Moderate-sized loculated left pleural effusion collection with a PleurX drainage catheter in place.Patient was placed on  broad-spectrum antibiotics of vancomycin and Zosyn  - Sepsis physiology resolved; Old left PleurX catheter removed by TCTS10/19. Vancomycin was discontinued on 10/22, MRSAPCR negative. - IR placed CT Guided Fluid Drain by PigTail Catheter 10/16 and drained ~550 mL. Patient taken to OR 10/19 with removal of old catheter and placement of a left sided chest tube  - post op had significant leukocytosis, up to 25 >> 23.9 >>17.3>>15.5>>19.9>>15.2>>13.4>>10.5>>8.8 - removed one of thechest tubes10/22 per TCTS - Fluid collected on 10/19 during surgery grew coagulase-negative staph which is pansensitive.  -10/27--d/c zosyn, start augmentin--plan 28 days of abx total (start date 10/14)--last day of abx 05/01/16  Acute respiratory failure with hypoxia -secondary to empyema and CHF -04/14/16 CXR--increase pulm vasc congestion -04/15/16 CXR personally reviewd--unchanged interstitial edema -stable on 2 L  -pt will go home with oxygen 2L  Acute diastolic CHF -Daily weights--NEG 7 lbs for the admission -NEG 5.1 L with IV lasix -10/27--increase lasix to 40 mg IV bid -possible switch to po lasix on 10/31--home with lasix 40 mg po daily -Daily BMP -Echocardiogram--EF 50-55%, grade 2 DD, HK inferior wall, mild MR, AS -continue metoprolol succinate -instructed daughter about daily weights and taking extra lasix if gains 3+ lbs on consecutive days or 5+ over 3 days  Acute on chronic Renal failureCKD III - baseline Cr 1.3 - 1.7 - With increase in creatinine and his vancomycin was discontinued and his Avapro was also stopped.  -am BMP -pt now has new baseline--1.9-2.0 -serum creatinine 2.02 on day of d/c -check BMP in one week  Hx of Non-Hodgkins Lymphoma - PET-CT Scan showed Enlarging hypermetabolic mediastinal lymph nodes suggesting recurrent lymphoma. - Patient was seen by Hematologist/Oncologist [name],  -will have close outpatient follow up  - Continue  scheduled follow-ups with  Hematology/Oncology after empyema treatment.  Abdominal pain - patient withintermittent crampyabdominal pain, states that he occasionally  -Present for the past 3-4 months -suspect component of IBS -Tolerating diet - plain films normal - 04/17/2016 CT abd/pelvis--fatty liver, atrophic pancreas, sigmoid diverticulosis, no acute worsening lymphadenopathy -abdominal pain improved with diuresis--?bowel congestive edema from CHF  MDS (myelodysplastic syndrome) with 5q deletion - Followed by Hematology/Oncology; Seen by [name] today - Continue Promacta.Patient's Hematologist increased it to 75 mg and will reassess next visit.  Hypokalemia - Improved, replete prn  Insulin Dependent Type 2 Diabetes Mellitus  - Continue Lantus and SSI,  -addednovolog 2 units tiw -overall controlled -resume home dose lantus after discharge  CAD  - Continue Metoprolol Succinate 100 mg by mouth daily -Irbesartan was held due to renal function  - Clopidogrel was heldby Hematology/Oncology because of Thrombocytopenia.  Essential Hypertension - Continue Metoprolol succinate100 mg po daily -expect continued improvement with diuresis  Thrombocytopenia  - Platelet Count stable/improving - SCDs for DVT prophylaxis. - Continue Promacta. Patient's Hematologist increased it to 75 mg and will reassess next visit. - Hematology recommended stopping Clopidogrel; P2Y12 from 10/15 WNL   NormcyticAnemia - Continue Ferrous Sulfate 325 mg tablet po  -indices suggest anemia of chronic disease -baseline Hgb ~8   Discharge Instructions  Discharge Instructions    Diet - low sodium heart healthy    Complete by:  As directed    Increase activity slowly    Complete by:  As directed        Medication List    STOP taking these medications   clopidogrel 75 MG tablet Commonly known as:  PLAVIX   cyanocobalamin 1000 MCG/ML injection Commonly known as:  (VITAMIN B-12)   doxazosin 4 MG  tablet Commonly known as:  CARDURA   valsartan 160 MG tablet Commonly known as:  DIOVAN     TAKE these medications   acetaminophen 500 MG tablet Commonly known as:  TYLENOL Take 1,000 mg by mouth every 6 (six) hours as needed for mild pain, moderate pain or headache.   amoxicillin-clavulanate 875-125 MG tablet Commonly known as:  AUGMENTIN Take 1 tablet by mouth every 12 (twelve) hours.   DELSYM PO Take 15 mLs by mouth at bedtime.   eltrombopag 75 MG tablet Commonly known as:  PROMACTA Take 1 tablet (75 mg total) by mouth daily. Take on an empty stomach 1 hour before a meal or 2 hours after What changed:  when to take this  additional instructions   ferrous sulfate 325 (65 FE) MG tablet Take 325 mg by mouth daily after supper.   fluticasone 50 MCG/ACT nasal spray Commonly known as:  FLONASE Place 1 spray into both nostrils at bedtime.   furosemide 40 MG tablet Commonly known as:  LASIX Take 1 tablet (40 mg total) by mouth daily.   glucose blood test strip 1 each by Other route daily. Fasting each morning   hydrocortisone cream 1 % Apply 1 application topically at bedtime as needed for itching.   Insulin Glargine 100 UNIT/ML Solostar Pen Commonly known as:  LANTUS SOLOSTAR INJECT 15 UNITS INTO THE SKIN EACH MORNING What changed:  how much to take  how to take this  when to take this  additional instructions   meclizine 12.5 MG tablet Commonly known as:  ANTIVERT Take 1 tablet (12.5 mg total) by mouth 3 (three) times daily as needed for dizziness.   metoCLOPramide 5 MG tablet Commonly known as:  REGLAN  Take 1 tablet (5 mg total) by mouth 3 (three) times daily before meals. What changed:  when to take this  reasons to take this   metoprolol succinate 100 MG 24 hr tablet Commonly known as:  TOPROL-XL TAKE 1 TABLET BY MOUTH ONCE A DAY   multivitamin with minerals Tabs tablet Take 1 tablet by mouth at bedtime.   OVER THE COUNTER  MEDICATION Place 1 drop into both eyes daily as needed (dry eyes). Over the counter lubricating eye drop      Follow-up Information    Tharon Aquas Adelene Idler, MD .   Specialty:  Cardiothoracic Surgery Why:  Appointment at 2:00pm on 05/05/2016. Please arrive at 1:30pm for a chest xray at [facility]. They are located on the first floor of our building. Contact information: [address] [place] [address] [phone]        [facility] .   Why:  HHPT arranged- they will call you to set up home visits Contact information: [address] [place] [address] [phone]        Inc[facility] - Dme [facility] .   Why:  home 02 arranged- portable tank to be delivered to room prior to discharge  Contact information: [place] [address] [phone]          Allergies  Allergen Reactions  . Glucophage [Metformin Hydrochloride] Other (See Comments)    Chest pain  . Zetia [Ezetimibe] Other (See Comments)    weakness  . Fenofibrate Rash  . Niacin-Lovastatin Er Rash    Consultations:  TCTS  Pulmonology   Procedures/Studies: Ct Abdomen Pelvis Wo Contrast  Result Date: 04/18/2016 CLINICAL DATA:  82 year old male with abdominal pain. History of prostate cancer and lymphoma. EXAM: CT ABDOMEN AND PELVIS WITHOUT CONTRAST TECHNIQUE: Multidetector CT imaging of the abdomen and pelvis was performed following the standard protocol without IV contrast. COMPARISON:  Chest CT dated 04/05/2016 and PETCT dated 04/02/2016 FINDINGS: Evaluation of this exam is limited in the absence of intravenous contrast. Lower chest: Partially visualized bilateral pleural effusions. There has been interval increase in the size of the pleural effusion on the right since the CT dated 04/05/2016. There is partial compressive atelectasis of the right lower lobe. Pneumonia is not excluded. Left lung base consolidative changes and  airspace infiltrative disease similar to prior CT. There has been interval removal of the previously seen left sided thoracostomy tube. A loculated left subpulmonic fluid may be present. There is coronary vascular calcification. There is hypoattenuation of the cardiac blood pool suggestive of a degree of anemia. Clinical correlation is recommended. No intra-abdominal free air or free fluid. Hepatobiliary: There is diffuse fatty infiltration of the liver. Scattered punctate calcified granuloma. No intrahepatic biliary ductal dilatation. The gallbladder appears unremarkable. Pancreas: The pancreas is somewhat atrophic. No duct dilatation or peripancreatic inflammation. Spleen: Normal in size without focal abnormality. Adrenals/Urinary Tract: The adrenal glands appear unremarkable. Bilateral renal vascular calcifications noted. A punctate nonobstructing right renal calculus. There is no hydronephrosis on either side. A 1.5 cm exophytic right renal hypodense lesion as well as a 1.5 cm left renal parenchymal interpolar hypodensity are not well characterized but likely represent cysts. The visualized ureters and urinary bladder appear unremarkable. Stomach/Bowel: There is extensive sigmoid diverticulosis with muscular hypertrophy. No definite active inflammatory changes. There is loss of fat plane between the sigmoid colon and bladder dome, likely related to chronic inflammation and adhesions. The colovesical fistula is  not entirely excluded. However no gas is seen within the bladder. There is no evidence of bowel obstruction. Normal appendix. Vascular/Lymphatic: There is moderate aortoiliac atherosclerotic disease. Mild ectasia of the infrarenal abdominal aorta measuring up to 2.3 cm. Right renal artery stent noted. No portal venous gas identified. There is no adenopathy. Reproductive: Prostate brachytherapy seeds noted. Other: Large fat containing right inguinal hernia without evidence of inflammatory changes. No fluid  collection. Musculoskeletal: Mild diffuse subcutaneous stranding. There is degenerative changes of the spine with bone spurring and osteophyte formation. Multilevel facet hypertrophy as well as mild lower lumbar disc bulge. No acute fracture. No suspicious lesions. The stable sclerotic focus in the left femoral neck most likely represents a bone island. IMPRESSION: Partially visualized bilateral pleural effusions with interval increase in the size of the right pleural effusion compared to prior study. Interval removal of the left-sided pleural drainage catheter. The visualized left lung base with appears similar to prior CT. No definite acute intra-abdominal pelvic pathology identified. No adenopathy or evidence of metastatic disease. Extensive sigmoid diverticulosis without definite active inflammatory changes. No bowel obstruction. Normal appendix. Probable adhesion of the sigmoid to the dome of the bladder without definite evidence of colovesical fistula. Electronically Signed   By: Anner Crete M.D.   On: 04/18/2016 02:20   Dg Chest 2 View  Result Date: 04/20/2016 CLINICAL DATA:  Chest tube placement. On oxygen. Diabetes. Hypertension. EXAM: CHEST  2 VIEW COMPARISON:  04/18/2016 FINDINGS: Removal of small bore left chest tube. Midline trachea. Cardiomegaly accentuated by AP portable technique. Atherosclerosis in the transverse aorta. Left pleural effusion is small to moderate and similar. Suspect minimal loculation laterally. No pneumothorax. Improved interstitial edema. Similar left and improved right base airspace disease. IMPRESSION: Interval removal of left chest tube. No pneumothorax. Persistent left pleural fluid with suggestion of loculation laterally. Similar left and improved right base airspace disease. Improved interstitial edema. Electronically Signed   By: Abigail Miyamoto M.D.   On: 04/20/2016 08:39   Dg Chest 2 View  Result Date: 04/07/2016 CLINICAL DATA:  Left-sided chest tube,  weakness, history of recurrent 9 malignant left pleural effusion. Also history of lymphoma EXAM: CHEST  2 VIEW COMPARISON:  Chest x-ray of 04/06/2016 and CT chest of 04/03/2016 FINDINGS: The left chest tube remains and no residual pneumothorax is seen. Apparently loculated left pleural effusion is unchanged. The pigtail of the catheter lies just superior to the superior extent of the apparently loculated effusion. Mild bibasilar atelectasis is present. Heart size is stable. IMPRESSION: 1. No pneumothorax is currently seen. 2. No change in left chest tube and apparently loculated left pleural effusion. Electronically Signed   By: Ivar Drape M.D.   On: 04/07/2016 13:23   Dg Chest 2 View  Result Date: 04/03/2016 CLINICAL DATA:  Sepsis.  Concern for empyema EXAM: CHEST  2 VIEW COMPARISON:  03/03/2016 FINDINGS: Loculated left pleural effusion, moderate size, thickest at the base. There is a tunneled pleural catheter at the left base with alignment that is stable from 04/02/2016. The underlying lung is partly atelectatic. Normal heart size. The lower aorta is obscured by a medial pleural disease. IMPRESSION: Moderate loculated left pleural effusion with tunneled pleural catheter. Appearance is stable compared to 04/02/2016 PET-CT. Electronically Signed   By: Monte Fantasia M.D.   On: 04/03/2016 20:58   Ct Chest W Contrast  Result Date: 04/03/2016 CLINICAL DATA:  82 year old male with fever. Patient with left pleural drainage catheter. History of lymphoma. EXAM: CT CHEST WITH CONTRAST  TECHNIQUE: Multidetector CT imaging of the chest was performed during intravenous contrast administration. CONTRAST:  6mL ISOVUE-300 IOPAMIDOL (ISOVUE-300) INJECTION 61% COMPARISON:  04/02/2016 PET-CT and prior studies. FINDINGS: Cardiovascular: Upper limits normal heart size and moderate coronary artery calcifications noted. There is no evidence of thoracic aortic aneurysm. Mediastinum/Nodes: Multiple shotty and enlarged  mediastinal lymph nodes are identified, the largest in the lower thoracic para-aortic region. These mediastinal lymph nodes for mildly hypermetabolic on recent PET study suspicious for recurrent lymphoma. There is no evidence of pericardial effusion. Lungs/Pleura: A moderate loculated left pleural effusion is again identified. A left pleural catheter with tip in the posterior-inferior left pleural space again noted. Left lower lung atelectasis identified. Mild interlobular septal thickening is noted within both lungs. No discrete mass identified. There is no evidence of pneumothorax or right pleural effusion. Upper Abdomen: No acute abnormality. Upper limits normal spleen size noted. Musculoskeletal: No acute or suspicious abnormality. Degenerative disc disease in the lower cervical spine identified. IMPRESSION: Moderate loculated left pleural effusion, with left pleural catheter tip in the posterior inferior left pleural space. Associated left lower lung atelectasis. Mediastinal lymph nodes as described, mildly hypermetabolic on recent PET study suspicious for recurrent lymphoma. Coronary artery disease. Electronically Signed   By: Margarette Canada M.D.   On: 04/03/2016 21:47   Nm Pet Image Restag (ps) Skull Base To Thigh  Result Date: 04/02/2016 CLINICAL DATA:  Subsequent treatment strategy for lymphoma. EXAM: NUCLEAR MEDICINE PET SKULL BASE TO THIGH TECHNIQUE: 12.4 mCi F-18 FDG was injected intravenously. Full-ring PET imaging was performed from the skull base to thigh after the radiotracer. CT data was obtained and used for attenuation correction and anatomic localization. FASTING BLOOD GLUCOSE:  Value: 119 mg/dl COMPARISON:  10/30/2014 FINDINGS: NECK No hypermetabolic lymph nodes in the neck. CHEST There is a moderate-sized left loculated pleural effusion. No hypermetabolism to suggest a malignant effusion. It measures as simple fluid. New line small nodules are noted along the left major fissure. These are  likely lymph nodes. Is also 3 subpleural nodules in the left upper on image number 104. No obvious hypermetabolism. SUV max is 1.7. These are suspicious for lymphomatous nodules however and they are new since the prior study. Enlarging mediastinal lymph nodes are mildly hypermetabolic and suspicious for recurrent lymphoma. 5 mm pretracheal lymph node on image number 87 has an SUV max of 2.56. Multiple small prevascular lymph nodes. The largest node measures 5 mm on image number 100 and SUV max is 2.46. 11 mm aorticopulmonary window lymph node on image number 94 has an SUV max of 3.5. Subcarinal lymph node on image number 109 measures 10 mm and has an SUV max of 3.5. Left-sided retrocrural lymph node measures 23 x 16 mm on image number 127 and has SUV max of 5.9. Right-sided periaortic lymph node on image number 121 measures 8.5 mm and SUV max is 4.1. ABDOMEN/PELVIS No abnormal hypermetabolic activity within the liver, pancreas, adrenal glands, or spleen. No hypermetabolic lymph nodes in the abdomen or pelvis. Stable advanced atherosclerotic calcifications involving the aorta and branch vessels. The right renal artery stent is noted. Brachytherapy seeds are noted the prostate gland. There is a large right inguinal hernia containing fat. SKELETON No focal hypermetabolic activity to suggest skeletal metastasis. IMPRESSION: 1. Enlarging hypermetabolic mediastinal lymph nodes suggesting recurrent lymphoma. 2. New nodules along the left major fissure and in the left upper lobe subpleural space worrisome for lymphomatous involvement. 3. Moderate-sized loculated left pleural effusion collection with a PleurX drainage  catheter in place. 4. No findings for adenopathy in the neck, abdomen or pelvis. Electronically Signed   By: Marijo Sanes M.D.   On: 04/02/2016 14:52   Dg Chest Port 1 View  Result Date: 04/18/2016 CLINICAL DATA:  Pleural effusion EXAM: PORTABLE CHEST 1 VIEW COMPARISON:  04/15/2016 chest radiograph.  FINDINGS: Left chest tube terminates at the far periphery of the left upper pleural space. Stable cardiomediastinal silhouette with top-normal heart size and aortic atherosclerosis. No pneumothorax. Small to moderate left pleural effusion is stable. Small right pleural effusion is stable. Patchy opacities throughout both lungs, left greater than right, not appreciably changed. IMPRESSION: 1. Small to moderate left pleural effusion, stable. Left chest tube terminates at the far periphery of the left upper pleural space. No pneumothorax. 2. Small right pleural effusion, stable . 3. Patchy bilateral lung opacities, left greater than right, not appreciably changed, differential includes multilobar infection and/or edema. 4. Aortic atherosclerosis. Electronically Signed   By: Ilona Sorrel M.D.   On: 04/18/2016 08:34   Dg Chest Port 1 View  Result Date: 04/15/2016 CLINICAL DATA:  Chest 2 present. Shortness of breath. Pulmonary edema. EXAM: PORTABLE CHEST 1 VIEW COMPARISON:  1 day prior FINDINGS: Left-sided small bore chest tube is unchanged in position. Midline trachea. Cardiomegaly accentuated by AP portable technique. Apparent superior mediastinal soft tissue fullness is favored to be due to AP portable technique. Atherosclerosis in the transverse aorta. Loculated left-sided pleural effusion is small and similar given differences in technique. No pneumothorax. Interstitial edema is moderate, accentuated by diminished lung volumes today. Slight worsening left base aeration with persistent bibasilar airspace opacities. IMPRESSION: Small bore left-sided chest tube remaining in place with persistent loculated left-sided effusion. No pneumothorax. Diminished lung volumes. Persistent interstitial edema with similar right and increased left base airspace disease. Electronically Signed   By: Abigail Miyamoto M.D.   On: 04/15/2016 07:51   Dg Chest Port 1 View  Result Date: 04/14/2016 CLINICAL DATA:  Left chest tube  followup EXAM: PORTABLE CHEST 1 VIEW COMPARISON:  04/12/2016 FINDINGS: Left-sided chest tube is in place. No pneumothorax identified. The left pleural effusion is not significantly changed in volume compared with previous exam. Moderate diffuse pulmonary edema is identified which appears increased in the interval. IMPRESSION: 1. Stable appearance of left chest tube. No change in volume of left effusion. 2. Increase in diffuse pulmonary edema pattern. Electronically Signed   By: Kerby Moors M.D.   On: 04/14/2016 08:52   Dg Chest Port 1 View  Result Date: 04/12/2016 CLINICAL DATA:  Followup left-sided pleural effusion. EXAM: PORTABLE CHEST 1 VIEW COMPARISON:  04/10/2016 FINDINGS: The left-sided pleural drainage catheter is stable. There is a persistent small to moderate-sized left pleural effusion with overlying atelectasis. Persistent central vascular congestion. IMPRESSION: Persistent left-sided pleural effusion and overlying atelectasis. Persistent central vascular congestion Electronically Signed   By: Marijo Sanes M.D.   On: 04/12/2016 09:13   Dg Chest Port 1 View  Result Date: 04/10/2016 CLINICAL DATA:  Followup for left chest tube. Left pleural effusion. EXAM: PORTABLE CHEST 1 VIEW COMPARISON:  04/09/2016 FINDINGS: Left-sided pigtail catheter is stable, projecting along the lateral left mid to upper hemi thorax. There is persistent left pleural fluid and left lung base parenchymal opacity. No new lung abnormalities. No pneumothorax. IMPRESSION: 1. No change from the previous day's study. 2. Stable left chest tube.  No pneumothorax. 3. Persistent left lung base opacity with an associated pleural effusion. Electronically Signed   By: Lajean Manes  M.D.   On: 04/10/2016 07:51   Dg Chest Port 1 View  Result Date: 04/09/2016 CLINICAL DATA:  Soreness in chest.  Chest tube present. EXAM: PORTABLE CHEST 1 VIEW COMPARISON:  04/08/2016.  04/03/2016. FINDINGS: Again noted is a left basilar chest tube  and a small bore pigtail chest tube in the lateral upper chest. There continues to be evidence for a loculated left pleural fluid which is caudal to the pigtail catheter. Evidence for compressive atelectasis at the left lung base. Slightly increased densities in the right lower lung. Heart size is stable. Trachea is midline. Negative for pneumothorax. IMPRESSION: No change in the loculated left pleural effusion. Stable position of the left chest tubes without a pneumothorax. Electronically Signed   By: Markus Daft M.D.   On: 04/09/2016 08:56   Dg Chest Port 1 View  Result Date: 04/08/2016 CLINICAL DATA:  Shortness of breath. EXAM: PORTABLE CHEST 1 VIEW COMPARISON:  04/08/2016.  04/07/2016. FINDINGS: PleurX catheter again noted on the left in stable position. No pneumothorax. Cardiomegaly with bilateral pulmonary interstitial prominence and left-sided pleural effusion noted consistent congestive heart failure. Similar finding noted on prior exam from earlier today. IMPRESSION: 1. Left chest tube noted in stable position. No pneumothorax. Stable left pleural effusion. 2. Persistent changes of congestive heart failure with pulmonary interstitial edema. No interim change from earlier exam today. Electronically Signed   By: Marcello Moores  Register   On: 04/08/2016 13:36   Dg Chest Portable 1 View  Result Date: 04/08/2016 CLINICAL DATA:  PleurX catheter placement. EXAM: PORTABLE CHEST 1 VIEW COMPARISON:  04/07/2016. FINDINGS: PleurX catheter on the left again noted. Small left pleural effusion again noted. No pneumothorax . Cardiomegaly with pulmonary vascular prominence and mild interstitial prominence noted. Mild component congestive heart failure cannot be excluded. IMPRESSION: 1. PleurX catheter on the left again noted. No pneumothorax. Small left pleural effusion again noted. 2. Cannot exclude mild congestive heart failure with mild interstitial edema. Electronically Signed   By: Marcello Moores  Register   On: 04/08/2016  12:31   Dg Chest Port 1 View  Addendum Date: 04/07/2016   ADDENDUM REPORT: 04/07/2016 08:27 ADDENDUM: Critical Value/emergent results were called by telephone at the time of interpretation on 04/07/2016 at 8:27 am to the patient's interventional radiologist, who verbally acknowledged these results. Electronically Signed   By: Marcello Moores  Register   On: 04/07/2016 08:27   Result Date: 04/07/2016 CLINICAL DATA:  Shortness of breath. EXAM: PORTABLE CHEST 1 VIEW COMPARISON:  CT 04/05/2016. FINDINGS: Left chest tube noted. Previously identified large left left up pleural effusion is been removed. Residual left lower pleural effusion. Tiny left pneumothorax present. This was discussed with the patient's interventional radiologist. Stable cardiomegaly. No acute bony abnormality. IMPRESSION: Left chest tube noted in good anatomic position. Significant resolution of left-sided pneumothorax. Residual left lower pleural effusion present. Miniscule left pneumothorax noted. Electronically Signed: By: Marcello Moores  Register On: 04/06/2016 16:30   Dg Chest Port 1 View  Result Date: 04/05/2016 CLINICAL DATA:  Preprocedural evaluation for pleural drainage catheter. EXAM: PORTABLE CHEST 1 VIEW COMPARISON:  CT 04/03/2016.  Chest x-ray 04/03/2016 . FINDINGS: Progressive left pleural effusion noted. Some degree of loculation may be present. Mild cardiomegaly with mild interstitial prominence, a component of mild congestive heart failure cannot be excluded. No pneumothorax . IMPRESSION: 1. Progressive left pleural effusion with possible loculation. 2. Cardiomegaly with mild interstitial prominence. A component of mild congestive heart failure cannot be excluded . Electronically Signed   By: Marcello Moores  Register  On: 04/05/2016 08:01   Dg Abd Portable 2v  Result Date: 04/10/2016 CLINICAL DATA:  Abdominal pain EXAM: PORTABLE ABDOMEN - 2 VIEW COMPARISON:  None. FINDINGS: Scattered large and small bowel gas is noted. A left thoracostomy  catheter is noted over the base. No obstructive changes are seen. No free air is noted. Mild degenerative changes of the lumbar spine are seen. IMPRESSION: No acute abnormality noted. Electronically Signed   By: Inez Catalina M.D.   On: 04/10/2016 08:58   Ct Image Guided Fluid Drain By Catheter  Result Date: 04/05/2016 CLINICAL DATA:  Lymphoma, and left-sided empyema with residual loculated left pleural fluid remaining. Request has been made to place a percutaneous pleural drainage catheter. EXAM: CT GUIDED LEFT THORACOSTOMY TUBE PLACEMENT FOR EMPYEMA ANESTHESIA/SEDATION: 1.0 mg IV Versed 25 mcg IV Fentanyl Total Moderate Sedation Time:  20 minutes The patient's level of consciousness and physiologic status were continuously monitored during the procedure by Radiology nursing. PROCEDURE: The procedure, risks, benefits, and alternatives were explained to the patient's wife. Questions regarding the procedure were encouraged and answered. The patient's wife understands and consents to the procedure. A time out was performed prior to initiating the procedure. The left upper chest wall was prepped with chlorhexidine in a sterile fashion, and a sterile drape was applied covering the operative field. A sterile gown and sterile gloves were used for the procedure. Local anesthesia was provided with 1% Lidocaine. CT was performed in a supine position. After localizing the most appropriate target for pleural drainage, an 18 gauge trocar needle was advanced under CT guidance into the left pleural space. Fluid was aspirated. A guidewire was then advanced through the needle and the needle removed. The percutaneous tract was dilated over the guidewire. A 12 French pigtail drainage catheter was then advanced over the wire. After the catheter was formed, additional CT images were performed. The catheter was then connected to a [place] Pleur-Evac device. COMPLICATIONS: None FINDINGS: The largest loculation of residual left pleural  fluid is in the lateral superior hemithorax. Aspiration yielded clear, yellowish fluid. Thorax. After pleural drain placement, there is return of yellowish colored clear fluid. IMPRESSION: CT-guided left chest thoracostomy tube placement with placement of 12 French drain into the left pleural space. The site of loculated fluid in the superior and lateral aspect of the hemithorax was targeted for catheter placement. Electronically Signed   By: Aletta Edouard M.D.   On: 04/05/2016 15:54        Discharge Exam: Vitals:   04/19/16 2002 04/20/16 0500  BP: (!) 128/59 (!) 127/55  Pulse: 89 81  Resp: 18 18  Temp: 98.2 F (36.8 C) 97.4 F (36.3 C)   Vitals:   04/19/16 0553 04/19/16 1352 04/19/16 2002 04/20/16 0500  BP:  130/60 (!) 128/59 (!) 127/55  Pulse:  100 89 81  Resp:  18 18 18   Temp:  97.6 F (36.4 C) 98.2 F (36.8 C) 97.4 F (36.3 C)  TempSrc:  Oral Oral Axillary  SpO2:  97% 98% 99%  Weight: 86.5 kg (190 lb 11.2 oz)   83.3 kg (183 lb 10.3 oz)  Height:        General: Pt is alert, awake, not in acute distress Cardiovascular: RRR, S1/S2 +, no rubs, no gallops Respiratory: CTA bilaterally, no wheezing, no rhonchi Abdominal: Soft, NT, ND, bowel sounds + Extremities: no edema, no cyanosis   The results of significant diagnostics from this hospitalization (including imaging, microbiology, ancillary and laboratory) are listed below for reference.  Significant Diagnostic Studies: Ct Abdomen Pelvis Wo Contrast  Result Date: 04/18/2016 CLINICAL DATA:  82 year old male with abdominal pain. History of prostate cancer and lymphoma. EXAM: CT ABDOMEN AND PELVIS WITHOUT CONTRAST TECHNIQUE: Multidetector CT imaging of the abdomen and pelvis was performed following the standard protocol without IV contrast. COMPARISON:  Chest CT dated 04/05/2016 and PETCT dated 04/02/2016 FINDINGS: Evaluation of this exam is limited in the absence of intravenous contrast. Lower chest: Partially visualized  bilateral pleural effusions. There has been interval increase in the size of the pleural effusion on the right since the CT dated 04/05/2016. There is partial compressive atelectasis of the right lower lobe. Pneumonia is not excluded. Left lung base consolidative changes and airspace infiltrative disease similar to prior CT. There has been interval removal of the previously seen left sided thoracostomy tube. A loculated left subpulmonic fluid may be present. There is coronary vascular calcification. There is hypoattenuation of the cardiac blood pool suggestive of a degree of anemia. Clinical correlation is recommended. No intra-abdominal free air or free fluid. Hepatobiliary: There is diffuse fatty infiltration of the liver. Scattered punctate calcified granuloma. No intrahepatic biliary ductal dilatation. The gallbladder appears unremarkable. Pancreas: The pancreas is somewhat atrophic. No duct dilatation or peripancreatic inflammation. Spleen: Normal in size without focal abnormality. Adrenals/Urinary Tract: The adrenal glands appear unremarkable. Bilateral renal vascular calcifications noted. A punctate nonobstructing right renal calculus. There is no hydronephrosis on either side. A 1.5 cm exophytic right renal hypodense lesion as well as a 1.5 cm left renal parenchymal interpolar hypodensity are not well characterized but likely represent cysts. The visualized ureters and urinary bladder appear unremarkable. Stomach/Bowel: There is extensive sigmoid diverticulosis with muscular hypertrophy. No definite active inflammatory changes. There is loss of fat plane between the sigmoid colon and bladder dome, likely related to chronic inflammation and adhesions. The colovesical fistula is not entirely excluded. However no gas is seen within the bladder. There is no evidence of bowel obstruction. Normal appendix. Vascular/Lymphatic: There is moderate aortoiliac atherosclerotic disease. Mild ectasia of the infrarenal  abdominal aorta measuring up to 2.3 cm. Right renal artery stent noted. No portal venous gas identified. There is no adenopathy. Reproductive: Prostate brachytherapy seeds noted. Other: Large fat containing right inguinal hernia without evidence of inflammatory changes. No fluid collection. Musculoskeletal: Mild diffuse subcutaneous stranding. There is degenerative changes of the spine with bone spurring and osteophyte formation. Multilevel facet hypertrophy as well as mild lower lumbar disc bulge. No acute fracture. No suspicious lesions. The stable sclerotic focus in the left femoral neck most likely represents a bone island. IMPRESSION: Partially visualized bilateral pleural effusions with interval increase in the size of the right pleural effusion compared to prior study. Interval removal of the left-sided pleural drainage catheter. The visualized left lung base with appears similar to prior CT. No definite acute intra-abdominal pelvic pathology identified. No adenopathy or evidence of metastatic disease. Extensive sigmoid diverticulosis without definite active inflammatory changes. No bowel obstruction. Normal appendix. Probable adhesion of the sigmoid to the dome of the bladder without definite evidence of colovesical fistula. Electronically Signed   By: Anner Crete M.D.   On: 04/18/2016 02:20   Dg Chest 2 View  Result Date: 04/20/2016 CLINICAL DATA:  Chest tube placement. On oxygen. Diabetes. Hypertension. EXAM: CHEST  2 VIEW COMPARISON:  04/18/2016 FINDINGS: Removal of small bore left chest tube. Midline trachea. Cardiomegaly accentuated by AP portable technique. Atherosclerosis in the transverse aorta. Left pleural effusion is small to moderate and similar. Suspect  minimal loculation laterally. No pneumothorax. Improved interstitial edema. Similar left and improved right base airspace disease. IMPRESSION: Interval removal of left chest tube. No pneumothorax. Persistent left pleural fluid with  suggestion of loculation laterally. Similar left and improved right base airspace disease. Improved interstitial edema. Electronically Signed   By: Abigail Miyamoto M.D.   On: 04/20/2016 08:39   Dg Chest 2 View  Result Date: 04/07/2016 CLINICAL DATA:  Left-sided chest tube, weakness, history of recurrent 9 malignant left pleural effusion. Also history of lymphoma EXAM: CHEST  2 VIEW COMPARISON:  Chest x-ray of 04/06/2016 and CT chest of 04/03/2016 FINDINGS: The left chest tube remains and no residual pneumothorax is seen. Apparently loculated left pleural effusion is unchanged. The pigtail of the catheter lies just superior to the superior extent of the apparently loculated effusion. Mild bibasilar atelectasis is present. Heart size is stable. IMPRESSION: 1. No pneumothorax is currently seen. 2. No change in left chest tube and apparently loculated left pleural effusion. Electronically Signed   By: Ivar Drape M.D.   On: 04/07/2016 13:23   Dg Chest 2 View  Result Date: 04/03/2016 CLINICAL DATA:  Sepsis.  Concern for empyema EXAM: CHEST  2 VIEW COMPARISON:  03/03/2016 FINDINGS: Loculated left pleural effusion, moderate size, thickest at the base. There is a tunneled pleural catheter at the left base with alignment that is stable from 04/02/2016. The underlying lung is partly atelectatic. Normal heart size. The lower aorta is obscured by a medial pleural disease. IMPRESSION: Moderate loculated left pleural effusion with tunneled pleural catheter. Appearance is stable compared to 04/02/2016 PET-CT. Electronically Signed   By: Monte Fantasia M.D.   On: 04/03/2016 20:58   Ct Chest W Contrast  Result Date: 04/03/2016 CLINICAL DATA:  82 year old male with fever. Patient with left pleural drainage catheter. History of lymphoma. EXAM: CT CHEST WITH CONTRAST TECHNIQUE: Multidetector CT imaging of the chest was performed during intravenous contrast administration. CONTRAST:  68mL ISOVUE-300 IOPAMIDOL (ISOVUE-300)  INJECTION 61% COMPARISON:  04/02/2016 PET-CT and prior studies. FINDINGS: Cardiovascular: Upper limits normal heart size and moderate coronary artery calcifications noted. There is no evidence of thoracic aortic aneurysm. Mediastinum/Nodes: Multiple shotty and enlarged mediastinal lymph nodes are identified, the largest in the lower thoracic para-aortic region. These mediastinal lymph nodes for mildly hypermetabolic on recent PET study suspicious for recurrent lymphoma. There is no evidence of pericardial effusion. Lungs/Pleura: A moderate loculated left pleural effusion is again identified. A left pleural catheter with tip in the posterior-inferior left pleural space again noted. Left lower lung atelectasis identified. Mild interlobular septal thickening is noted within both lungs. No discrete mass identified. There is no evidence of pneumothorax or right pleural effusion. Upper Abdomen: No acute abnormality. Upper limits normal spleen size noted. Musculoskeletal: No acute or suspicious abnormality. Degenerative disc disease in the lower cervical spine identified. IMPRESSION: Moderate loculated left pleural effusion, with left pleural catheter tip in the posterior inferior left pleural space. Associated left lower lung atelectasis. Mediastinal lymph nodes as described, mildly hypermetabolic on recent PET study suspicious for recurrent lymphoma. Coronary artery disease. Electronically Signed   By: Margarette Canada M.D.   On: 04/03/2016 21:47   Nm Pet Image Restag (ps) Skull Base To Thigh  Result Date: 04/02/2016 CLINICAL DATA:  Subsequent treatment strategy for lymphoma. EXAM: NUCLEAR MEDICINE PET SKULL BASE TO THIGH TECHNIQUE: 12.4 mCi F-18 FDG was injected intravenously. Full-ring PET imaging was performed from the skull base to thigh after the radiotracer. CT data was obtained and  used for attenuation correction and anatomic localization. FASTING BLOOD GLUCOSE:  Value: 119 mg/dl COMPARISON:  10/30/2014 FINDINGS:  NECK No hypermetabolic lymph nodes in the neck. CHEST There is a moderate-sized left loculated pleural effusion. No hypermetabolism to suggest a malignant effusion. It measures as simple fluid. New line small nodules are noted along the left major fissure. These are likely lymph nodes. Is also 3 subpleural nodules in the left upper on image number 104. No obvious hypermetabolism. SUV max is 1.7. These are suspicious for lymphomatous nodules however and they are new since the prior study. Enlarging mediastinal lymph nodes are mildly hypermetabolic and suspicious for recurrent lymphoma. 5 mm pretracheal lymph node on image number 87 has an SUV max of 2.56. Multiple small prevascular lymph nodes. The largest node measures 5 mm on image number 100 and SUV max is 2.46. 11 mm aorticopulmonary window lymph node on image number 94 has an SUV max of 3.5. Subcarinal lymph node on image number 109 measures 10 mm and has an SUV max of 3.5. Left-sided retrocrural lymph node measures 23 x 16 mm on image number 127 and has SUV max of 5.9. Right-sided periaortic lymph node on image number 121 measures 8.5 mm and SUV max is 4.1. ABDOMEN/PELVIS No abnormal hypermetabolic activity within the liver, pancreas, adrenal glands, or spleen. No hypermetabolic lymph nodes in the abdomen or pelvis. Stable advanced atherosclerotic calcifications involving the aorta and branch vessels. The right renal artery stent is noted. Brachytherapy seeds are noted the prostate gland. There is a large right inguinal hernia containing fat. SKELETON No focal hypermetabolic activity to suggest skeletal metastasis. IMPRESSION: 1. Enlarging hypermetabolic mediastinal lymph nodes suggesting recurrent lymphoma. 2. New nodules along the left major fissure and in the left upper lobe subpleural space worrisome for lymphomatous involvement. 3. Moderate-sized loculated left pleural effusion collection with a PleurX drainage catheter in place. 4. No findings for  adenopathy in the neck, abdomen or pelvis. Electronically Signed   By: Marijo Sanes M.D.   On: 04/02/2016 14:52   Dg Chest Port 1 View  Result Date: 04/18/2016 CLINICAL DATA:  Pleural effusion EXAM: PORTABLE CHEST 1 VIEW COMPARISON:  04/15/2016 chest radiograph. FINDINGS: Left chest tube terminates at the far periphery of the left upper pleural space. Stable cardiomediastinal silhouette with top-normal heart size and aortic atherosclerosis. No pneumothorax. Small to moderate left pleural effusion is stable. Small right pleural effusion is stable. Patchy opacities throughout both lungs, left greater than right, not appreciably changed. IMPRESSION: 1. Small to moderate left pleural effusion, stable. Left chest tube terminates at the far periphery of the left upper pleural space. No pneumothorax. 2. Small right pleural effusion, stable . 3. Patchy bilateral lung opacities, left greater than right, not appreciably changed, differential includes multilobar infection and/or edema. 4. Aortic atherosclerosis. Electronically Signed   By: Ilona Sorrel M.D.   On: 04/18/2016 08:34   Dg Chest Port 1 View  Result Date: 04/15/2016 CLINICAL DATA:  Chest 2 present. Shortness of breath. Pulmonary edema. EXAM: PORTABLE CHEST 1 VIEW COMPARISON:  1 day prior FINDINGS: Left-sided small bore chest tube is unchanged in position. Midline trachea. Cardiomegaly accentuated by AP portable technique. Apparent superior mediastinal soft tissue fullness is favored to be due to AP portable technique. Atherosclerosis in the transverse aorta. Loculated left-sided pleural effusion is small and similar given differences in technique. No pneumothorax. Interstitial edema is moderate, accentuated by diminished lung volumes today. Slight worsening left base aeration with persistent bibasilar airspace opacities. IMPRESSION: Small  bore left-sided chest tube remaining in place with persistent loculated left-sided effusion. No pneumothorax.  Diminished lung volumes. Persistent interstitial edema with similar right and increased left base airspace disease. Electronically Signed   By: Abigail Miyamoto M.D.   On: 04/15/2016 07:51   Dg Chest Port 1 View  Result Date: 04/14/2016 CLINICAL DATA:  Left chest tube followup EXAM: PORTABLE CHEST 1 VIEW COMPARISON:  04/12/2016 FINDINGS: Left-sided chest tube is in place. No pneumothorax identified. The left pleural effusion is not significantly changed in volume compared with previous exam. Moderate diffuse pulmonary edema is identified which appears increased in the interval. IMPRESSION: 1. Stable appearance of left chest tube. No change in volume of left effusion. 2. Increase in diffuse pulmonary edema pattern. Electronically Signed   By: Kerby Moors M.D.   On: 04/14/2016 08:52   Dg Chest Port 1 View  Result Date: 04/12/2016 CLINICAL DATA:  Followup left-sided pleural effusion. EXAM: PORTABLE CHEST 1 VIEW COMPARISON:  04/10/2016 FINDINGS: The left-sided pleural drainage catheter is stable. There is a persistent small to moderate-sized left pleural effusion with overlying atelectasis. Persistent central vascular congestion. IMPRESSION: Persistent left-sided pleural effusion and overlying atelectasis. Persistent central vascular congestion Electronically Signed   By: Marijo Sanes M.D.   On: 04/12/2016 09:13   Dg Chest Port 1 View  Result Date: 04/10/2016 CLINICAL DATA:  Followup for left chest tube. Left pleural effusion. EXAM: PORTABLE CHEST 1 VIEW COMPARISON:  04/09/2016 FINDINGS: Left-sided pigtail catheter is stable, projecting along the lateral left mid to upper hemi thorax. There is persistent left pleural fluid and left lung base parenchymal opacity. No new lung abnormalities. No pneumothorax. IMPRESSION: 1. No change from the previous day's study. 2. Stable left chest tube.  No pneumothorax. 3. Persistent left lung base opacity with an associated pleural effusion. Electronically Signed   By:  Lajean Manes M.D.   On: 04/10/2016 07:51   Dg Chest Port 1 View  Result Date: 04/09/2016 CLINICAL DATA:  Soreness in chest.  Chest tube present. EXAM: PORTABLE CHEST 1 VIEW COMPARISON:  04/08/2016.  04/03/2016. FINDINGS: Again noted is a left basilar chest tube and a small bore pigtail chest tube in the lateral upper chest. There continues to be evidence for a loculated left pleural fluid which is caudal to the pigtail catheter. Evidence for compressive atelectasis at the left lung base. Slightly increased densities in the right lower lung. Heart size is stable. Trachea is midline. Negative for pneumothorax. IMPRESSION: No change in the loculated left pleural effusion. Stable position of the left chest tubes without a pneumothorax. Electronically Signed   By: Markus Daft M.D.   On: 04/09/2016 08:56   Dg Chest Port 1 View  Result Date: 04/08/2016 CLINICAL DATA:  Shortness of breath. EXAM: PORTABLE CHEST 1 VIEW COMPARISON:  04/08/2016.  04/07/2016. FINDINGS: PleurX catheter again noted on the left in stable position. No pneumothorax. Cardiomegaly with bilateral pulmonary interstitial prominence and left-sided pleural effusion noted consistent congestive heart failure. Similar finding noted on prior exam from earlier today. IMPRESSION: 1. Left chest tube noted in stable position. No pneumothorax. Stable left pleural effusion. 2. Persistent changes of congestive heart failure with pulmonary interstitial edema. No interim change from earlier exam today. Electronically Signed   By: Marcello Moores  Register   On: 04/08/2016 13:36   Dg Chest Portable 1 View  Result Date: 04/08/2016 CLINICAL DATA:  PleurX catheter placement. EXAM: PORTABLE CHEST 1 VIEW COMPARISON:  04/07/2016. FINDINGS: PleurX catheter on the left again noted. Small  left pleural effusion again noted. No pneumothorax . Cardiomegaly with pulmonary vascular prominence and mild interstitial prominence noted. Mild component congestive heart failure cannot  be excluded. IMPRESSION: 1. PleurX catheter on the left again noted. No pneumothorax. Small left pleural effusion again noted. 2. Cannot exclude mild congestive heart failure with mild interstitial edema. Electronically Signed   By: Marcello Moores  Register   On: 04/08/2016 12:31   Dg Chest Port 1 View  Addendum Date: 04/07/2016   ADDENDUM REPORT: 04/07/2016 08:27 ADDENDUM: Critical Value/emergent results were called by telephone at the time of interpretation on 04/07/2016 at 8:27 am to the patient's interventional radiologist, who verbally acknowledged these results. Electronically Signed   By: Marcello Moores  Register   On: 04/07/2016 08:27   Result Date: 04/07/2016 CLINICAL DATA:  Shortness of breath. EXAM: PORTABLE CHEST 1 VIEW COMPARISON:  CT 04/05/2016. FINDINGS: Left chest tube noted. Previously identified large left left up pleural effusion is been removed. Residual left lower pleural effusion. Tiny left pneumothorax present. This was discussed with the patient's interventional radiologist. Stable cardiomegaly. No acute bony abnormality. IMPRESSION: Left chest tube noted in good anatomic position. Significant resolution of left-sided pneumothorax. Residual left lower pleural effusion present. Miniscule left pneumothorax noted. Electronically Signed: By: Marcello Moores  Register On: 04/06/2016 16:30   Dg Chest Port 1 View  Result Date: 04/05/2016 CLINICAL DATA:  Preprocedural evaluation for pleural drainage catheter. EXAM: PORTABLE CHEST 1 VIEW COMPARISON:  CT 04/03/2016.  Chest x-ray 04/03/2016 . FINDINGS: Progressive left pleural effusion noted. Some degree of loculation may be present. Mild cardiomegaly with mild interstitial prominence, a component of mild congestive heart failure cannot be excluded. No pneumothorax . IMPRESSION: 1. Progressive left pleural effusion with possible loculation. 2. Cardiomegaly with mild interstitial prominence. A component of mild congestive heart failure cannot be excluded .  Electronically Signed   By: Marcello Moores  Register   On: 04/05/2016 08:01   Dg Abd Portable 2v  Result Date: 04/10/2016 CLINICAL DATA:  Abdominal pain EXAM: PORTABLE ABDOMEN - 2 VIEW COMPARISON:  None. FINDINGS: Scattered large and small bowel gas is noted. A left thoracostomy catheter is noted over the base. No obstructive changes are seen. No free air is noted. Mild degenerative changes of the lumbar spine are seen. IMPRESSION: No acute abnormality noted. Electronically Signed   By: Inez Catalina M.D.   On: 04/10/2016 08:58   Ct Image Guided Fluid Drain By Catheter  Result Date: 04/05/2016 CLINICAL DATA:  Lymphoma, and left-sided empyema with residual loculated left pleural fluid remaining. Request has been made to place a percutaneous pleural drainage catheter. EXAM: CT GUIDED LEFT THORACOSTOMY TUBE PLACEMENT FOR EMPYEMA ANESTHESIA/SEDATION: 1.0 mg IV Versed 25 mcg IV Fentanyl Total Moderate Sedation Time:  20 minutes The patient's level of consciousness and physiologic status were continuously monitored during the procedure by Radiology nursing. PROCEDURE: The procedure, risks, benefits, and alternatives were explained to the patient's wife. Questions regarding the procedure were encouraged and answered. The patient's wife understands and consents to the procedure. A time out was performed prior to initiating the procedure. The left upper chest wall was prepped with chlorhexidine in a sterile fashion, and a sterile drape was applied covering the operative field. A sterile gown and sterile gloves were used for the procedure. Local anesthesia was provided with 1% Lidocaine. CT was performed in a supine position. After localizing the most appropriate target for pleural drainage, an 18 gauge trocar needle was advanced under CT guidance into the left pleural space. Fluid was aspirated. A  guidewire was then advanced through the needle and the needle removed. The percutaneous tract was dilated over the guidewire. A  12 French pigtail drainage catheter was then advanced over the wire. After the catheter was formed, additional CT images were performed. The catheter was then connected to a [place] Pleur-Evac device. COMPLICATIONS: None FINDINGS: The largest loculation of residual left pleural fluid is in the lateral superior hemithorax. Aspiration yielded clear, yellowish fluid. Thorax. After pleural drain placement, there is return of yellowish colored clear fluid. IMPRESSION: CT-guided left chest thoracostomy tube placement with placement of 12 French drain into the left pleural space. The site of loculated fluid in the superior and lateral aspect of the hemithorax was targeted for catheter placement. Electronically Signed   By: Aletta Edouard M.D.   On: 04/05/2016 15:54     Microbiology: No results found for this or any previous visit (from the past 240 hour(s)).   Labs: Basic Metabolic Panel:  Recent Labs Lab 04/17/16 0343 04/18/16 0211 04/19/16 0154 04/20/16 0152  NA 141 138 137 137  K 3.5 3.6 3.8 4.3  CL 101 100* 98* 96*  CO2 32 31 32 32  GLUCOSE 130* 121* 106* 93  BUN 16 16 18  22*  CREATININE 1.95* 1.85* 1.90* 2.02*  CALCIUM 7.5* 7.7* 7.7* 7.9*  MG 1.9  --   --  2.1   Liver Function Tests: No results for input(s): AST, ALT, ALKPHOS, BILITOT, PROT, ALBUMIN in the last 168 hours. No results for input(s): LIPASE, AMYLASE in the last 168 hours. No results for input(s): AMMONIA in the last 168 hours. CBC:  Recent Labs Lab 04/17/16 0343 04/18/16 0211 04/19/16 0154  WBC 10.5 10.4 8.8  HGB 7.7* 7.3* 8.0*  HCT 26.5* 25.4* 27.4*  MCV 81.0 79.6 79.2  PLT 67* 63* 63*   Cardiac Enzymes: No results for input(s): CKTOTAL, CKMB, CKMBINDEX, TROPONINI in the last 168 hours. BNP: Invalid input(s): POCBNP CBG:  Recent Labs Lab 04/19/16 1109 04/19/16 1613 04/19/16 2003 04/20/16 0603 04/20/16 1102  GLUCAP 224* 161* 85 85 188*    Time coordinating discharge:  Greater than 30  minutes  Signed:  Margherita Collyer, DO [facility] Pager: [phone] 04/23/2016, 1:45 PM

## 2016-04-26 DIAGNOSIS — I130 Hypertensive heart and chronic kidney disease with heart failure and stage 1 through stage 4 chronic kidney disease, or unspecified chronic kidney disease: Secondary | ICD-10-CM | POA: Diagnosis not present

## 2016-04-26 DIAGNOSIS — N183 Chronic kidney disease, stage 3 (moderate): Secondary | ICD-10-CM | POA: Diagnosis not present

## 2016-04-26 DIAGNOSIS — J90 Pleural effusion, not elsewhere classified: Secondary | ICD-10-CM | POA: Diagnosis not present

## 2016-04-26 DIAGNOSIS — I5031 Acute diastolic (congestive) heart failure: Secondary | ICD-10-CM | POA: Diagnosis not present

## 2016-04-26 DIAGNOSIS — E1122 Type 2 diabetes mellitus with diabetic chronic kidney disease: Secondary | ICD-10-CM | POA: Diagnosis not present

## 2016-04-26 DIAGNOSIS — J869 Pyothorax without fistula: Secondary | ICD-10-CM | POA: Diagnosis not present

## 2016-04-28 ENCOUNTER — Telehealth: Payer: Self-pay | Admitting: *Deleted

## 2016-04-28 DIAGNOSIS — E1122 Type 2 diabetes mellitus with diabetic chronic kidney disease: Secondary | ICD-10-CM | POA: Diagnosis not present

## 2016-04-28 DIAGNOSIS — J869 Pyothorax without fistula: Secondary | ICD-10-CM | POA: Diagnosis not present

## 2016-04-28 DIAGNOSIS — N183 Chronic kidney disease, stage 3 (moderate): Secondary | ICD-10-CM | POA: Diagnosis not present

## 2016-04-28 DIAGNOSIS — I130 Hypertensive heart and chronic kidney disease with heart failure and stage 1 through stage 4 chronic kidney disease, or unspecified chronic kidney disease: Secondary | ICD-10-CM | POA: Diagnosis not present

## 2016-04-28 DIAGNOSIS — J90 Pleural effusion, not elsewhere classified: Secondary | ICD-10-CM | POA: Diagnosis not present

## 2016-04-28 DIAGNOSIS — I5031 Acute diastolic (congestive) heart failure: Secondary | ICD-10-CM | POA: Diagnosis not present

## 2016-04-28 NOTE — Telephone Encounter (Signed)
He had recent B12 level that came back OK OK to stop for now

## 2016-04-28 NOTE — Telephone Encounter (Signed)
Wife states pt recently d/c'd from hospital and D/C instructions tell him to stop taking Vitamin B12 injections. She asks why it was stopped?  Does [name] want him to stop or continue?

## 2016-04-28 NOTE — Telephone Encounter (Signed)
Informed wife ok to stop Vitamin B12 , last level came back ok per [name].  She verbalized understanding. She says she is going to call us back when pt is a little stronger to make f/u visit w/ [name].   She says [name] wanted to see him before January and she is supposed to call when he is strong enough to get to our office for appt.Marland Kitchen

## 2016-04-30 DIAGNOSIS — E1122 Type 2 diabetes mellitus with diabetic chronic kidney disease: Secondary | ICD-10-CM | POA: Diagnosis not present

## 2016-04-30 DIAGNOSIS — I5031 Acute diastolic (congestive) heart failure: Secondary | ICD-10-CM | POA: Diagnosis not present

## 2016-04-30 DIAGNOSIS — J869 Pyothorax without fistula: Secondary | ICD-10-CM | POA: Diagnosis not present

## 2016-04-30 DIAGNOSIS — I130 Hypertensive heart and chronic kidney disease with heart failure and stage 1 through stage 4 chronic kidney disease, or unspecified chronic kidney disease: Secondary | ICD-10-CM | POA: Diagnosis not present

## 2016-04-30 DIAGNOSIS — J90 Pleural effusion, not elsewhere classified: Secondary | ICD-10-CM | POA: Diagnosis not present

## 2016-04-30 DIAGNOSIS — N183 Chronic kidney disease, stage 3 (moderate): Secondary | ICD-10-CM | POA: Diagnosis not present

## 2016-05-03 DIAGNOSIS — E1122 Type 2 diabetes mellitus with diabetic chronic kidney disease: Secondary | ICD-10-CM | POA: Diagnosis not present

## 2016-05-03 DIAGNOSIS — J869 Pyothorax without fistula: Secondary | ICD-10-CM | POA: Diagnosis not present

## 2016-05-03 DIAGNOSIS — I5031 Acute diastolic (congestive) heart failure: Secondary | ICD-10-CM | POA: Diagnosis not present

## 2016-05-03 DIAGNOSIS — N183 Chronic kidney disease, stage 3 (moderate): Secondary | ICD-10-CM | POA: Diagnosis not present

## 2016-05-03 DIAGNOSIS — I130 Hypertensive heart and chronic kidney disease with heart failure and stage 1 through stage 4 chronic kidney disease, or unspecified chronic kidney disease: Secondary | ICD-10-CM | POA: Diagnosis not present

## 2016-05-03 DIAGNOSIS — J90 Pleural effusion, not elsewhere classified: Secondary | ICD-10-CM | POA: Diagnosis not present

## 2016-05-05 ENCOUNTER — Encounter: Payer: Self-pay | Admitting: Cardiothoracic Surgery

## 2016-05-05 ENCOUNTER — Ambulatory Visit
Admission: RE | Admit: 2016-05-05 | Discharge: 2016-05-05 | Disposition: A | Discharge status: Home / Self Care | Payer: Medicare Other | Source: Ambulatory Visit | Attending: Cardiothoracic Surgery | Admitting: Cardiothoracic Surgery

## 2016-05-05 ENCOUNTER — Ambulatory Visit (INDEPENDENT_AMBULATORY_CARE_PROVIDER_SITE_OTHER): Payer: Medicare Other | Admitting: Cardiothoracic Surgery

## 2016-05-05 VITALS — BP 139/75 | HR 90 | Resp 20 | Ht 67.0 in | Wt 183.0 lb

## 2016-05-05 DIAGNOSIS — J90 Pleural effusion, not elsewhere classified: Secondary | ICD-10-CM

## 2016-05-05 NOTE — Progress Notes (Signed)
PCP is [name], MD Referring Provider is Tanda Rockers, MD  Chief Complaint  Patient presents with  . Pleural Effusion    f/u for hospital discharge with CXR    HPI: Postop, posthospitalization visit after left chest tube placed for loculated pleural effusion-empyema. Patient had prior history of Pleurx catheter for several months which had been removed. He is finish his course of oral Augmentin. His chest x-rays clear. The chest tube site has healed. He is on home oxygen but gaining strength. He takes Lasix 40 mg daily. His edema is significantly better and we'll reduce the Lasix to 20 mg daily. He will resume his prehospital Plavix for history of stroke He will resume his prehospital dose of Flomax for BPH  Past Medical History:  Diagnosis Date  . Adenomatous colon polyp   . CAD (coronary artery disease)    30% LAD Stenosis, 70% ramus intermedius stenosis, treated with PTCA and angioplasty by Dr Albertine Patricia 2004  . Cataract    right eye  . Cough, persistent 11/04/2015  . Deficiency anemia 04/19/2014  . Diverticulosis   . DM (diabetes mellitus) ([place])   . DVT (deep venous thrombosis) ([place])    secondary to surgery  . Dyslipidemia   . Headache    occ  . Hyperlipidemia   . Hypertension   . Inguinal hernia    right  . 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 Microcytic anemia 07/07/2015  . 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 Non Hodgkin's lymphoma ([place])   . Pleural effusion, left 11/04/2015  . Pneumonia   . 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 Prostate cancer ([place])    seed implants no recurrence  . PVD (peripheral vascular disease) ([place])    rt renal artery stent  . Renal insufficiency   . Shortness of breath dyspnea   . Thrombocytopenia ([place])   . Thrombotic stroke ([place]) 09/10/2011   January 18, 2011 infarct genu & post limb R internal capsule - acute; previous  lacunar infarcts/extensive white matter dis  . Vitamin D deficiency     Past Surgical History:  Procedure Laterality Date  . APPENDECTOMY     patient ?  Marland Kitchen CARDIAC CATHETERIZATION    . CHEST TUBE INSERTION Left 02/10/2016   Procedure: INSERTION PLEURAL DRAINAGE CATHETER;  Surgeon: [name], MD;  Location: [facility];  Service: Thoracic;  Laterality: Left;  . CHEST TUBE INSERTION Left 04/08/2016   Procedure: CHEST TUBE INSERTION;  Surgeon: [name], MD;  Location: [facility];  Service: Thoracic;  Laterality: Left;  . COLONOSCOPY    . CORONARY ANGIOPLASTY    . CYSTOURETHROSCOPY     ROBOTIC ARM NUCLETRON SEED IMPLANTATION OF PROSTATE  . EXPLORATORY LAPAROTOMY     For evaluation of lymphoma  . REMOVAL OF PLEURAL DRAINAGE CATHETER Left 04/08/2016   Procedure: REMOVAL OF PLEURAL DRAINAGE CATHETER;  Surgeon: [name], MD;  Location: [facility] OR;  Service: Thoracic;  Laterality: Left;    Family History  Problem Relation Age of Onset  . Lymphoma Sister   . Prostate cancer Brother   . Breast cancer Sister   . Diabetes Brother   . Diabetes Sister   . Heart disease Brother   . Asthma Son     Social History Social History  Substance Use Topics  . Smoking status: Former Smoker  Packs/day: 0.50    Years: 20.00    Types: Pipe, Cigarettes    Quit date: 06/21/1958  . Smokeless tobacco: Never Used  . Alcohol use No    Current Outpatient Prescriptions  Medication Sig Dispense Refill  . acetaminophen (TYLENOL) 500 MG tablet Take 1,000 mg by mouth every 6 (six) hours as needed for mild pain, moderate pain or headache.     . Dextromethorphan Polistirex (DELSYM PO) Take 15 mLs by mouth at bedtime.    Marland Kitchen eltrombopag (PROMACTA) 75 MG tablet Take 1 tablet (75 mg total) by mouth daily. Take on an empty stomach 1 hour before a meal or 2 hours after (Patient taking differently: Take 75 mg by mouth daily before lunch. Take on an empty stomach 1 hour before a meal or 2 hours after) 30 tablet 9   . ferrous sulfate 325 (65 FE) MG tablet Take 325 mg by mouth daily after supper.     . fluticasone (FLONASE) 50 MCG/ACT nasal spray Place 1 spray into both nostrils at bedtime.   98  . furosemide (LASIX) 40 MG tablet Take 1 tablet (40 mg total) by mouth daily. 30 tablet 1  . glucose blood test strip 1 each by Other route daily. Fasting each morning 100 each 3  . hydrocortisone cream 1 % Apply 1 application topically at bedtime as needed for itching.    . Insulin Glargine (LANTUS SOLOSTAR) 100 UNIT/ML Solostar Pen INJECT 15 UNITS INTO THE SKIN EACH MORNING (Patient taking differently: Inject 15 Units into the skin daily after breakfast. INJECT 15 UNITS INTO THE SKIN EACH MO)    . meclizine (ANTIVERT) 12.5 MG tablet Take 1 tablet (12.5 mg total) by mouth 3 (three) times daily as needed for dizziness. 30 tablet 0  . metoprolol succinate (TOPROL-XL) 100 MG 24 hr tablet TAKE 1 TABLET BY MOUTH ONCE A DAY 90 tablet 3  . Multiple Vitamin (MULTIVITAMIN WITH MINERALS) TABS tablet Take 1 tablet by mouth at bedtime.    Marland Kitchen OVER THE COUNTER MEDICATION Place 1 drop into both eyes daily as needed (dry eyes). Over the counter lubricating eye drop     Current Facility-Administered Medications  Medication Dose Route Frequency Provider Last Rate Last Dose  . cyanocobalamin ((VITAMIN B-12)) injection 1,000 mcg  1,000 mcg Intramuscular Q30 days [name], MD   1,000 mcg at 02/27/16 1515    Allergies  Allergen Reactions  . Glucophage [Metformin Hydrochloride] Other (See Comments)    Chest pain  . Zetia [Ezetimibe] Other (See Comments)    weakness  . Fenofibrate Rash  . Niacin-Lovastatin Er Rash    Review of Systems  No fever Improved strength and appetite No chest pain No abdominal pain Minimal ankle swelling No headache or change in vision  BP 139/75 (BP Location: Right Arm, Patient Position: Sitting, Cuff Size: Small)   Pulse 90   Resp 20   Ht 5\' 7"  (1.702 m)   Wt 183 lb (83 kg)   SpO2 97%  Comment: 2L O2 per [place]  BMI 28.66 kg/m  Physical Exam      Exam    General- alert and comfortable   Lungs- clear without rales, wheezes   Cor- regular rate and rhythm, no murmur , gallop   Abdomen- soft, non-tender   Extremities - warm, non-tender, minimal edema   Neuro- oriented, appropriate, no focal weakness  Diagnostic Tests: Chest x-rays clear  Impression: Resolved left loculated pleural effusion  Plan: Re- turn with chest x-ray in  one month  Continue home oxygen at night and during the day when necessary Len Childs, MD [facility] [phone]

## 2016-05-06 ENCOUNTER — Ambulatory Visit (INDEPENDENT_AMBULATORY_CARE_PROVIDER_SITE_OTHER): Payer: Medicare Other | Admitting: *Deleted

## 2016-05-06 DIAGNOSIS — J90 Pleural effusion, not elsewhere classified: Secondary | ICD-10-CM | POA: Diagnosis not present

## 2016-05-06 DIAGNOSIS — I130 Hypertensive heart and chronic kidney disease with heart failure and stage 1 through stage 4 chronic kidney disease, or unspecified chronic kidney disease: Secondary | ICD-10-CM | POA: Diagnosis not present

## 2016-05-06 DIAGNOSIS — E1122 Type 2 diabetes mellitus with diabetic chronic kidney disease: Secondary | ICD-10-CM | POA: Diagnosis not present

## 2016-05-06 DIAGNOSIS — N183 Chronic kidney disease, stage 3 (moderate): Secondary | ICD-10-CM | POA: Diagnosis not present

## 2016-05-06 DIAGNOSIS — J869 Pyothorax without fistula: Secondary | ICD-10-CM | POA: Diagnosis not present

## 2016-05-06 DIAGNOSIS — I5031 Acute diastolic (congestive) heart failure: Secondary | ICD-10-CM | POA: Diagnosis not present

## 2016-05-06 DIAGNOSIS — D518 Other vitamin B12 deficiency anemias: Secondary | ICD-10-CM | POA: Diagnosis not present

## 2016-05-06 MED ORDER — CYANOCOBALAMIN 1000 MCG/ML IJ SOLN
1000.0000 ug | Freq: Once | INTRAMUSCULAR | Status: AC
Start: 2016-05-06 — End: 2016-05-06
  Administered 2016-05-06: 1000 ug via INTRAMUSCULAR

## 2016-05-06 NOTE — Progress Notes (Signed)
Patient ID: Ricardo Watkins, male   DOB: December 24, 1930, 82 y.o.   MRN: [id number] Patient seen in office for Vitamin B 12 injection.   Tolerated IM administration well.

## 2016-05-07 DIAGNOSIS — J869 Pyothorax without fistula: Secondary | ICD-10-CM | POA: Diagnosis not present

## 2016-05-07 DIAGNOSIS — E1122 Type 2 diabetes mellitus with diabetic chronic kidney disease: Secondary | ICD-10-CM | POA: Diagnosis not present

## 2016-05-07 DIAGNOSIS — I130 Hypertensive heart and chronic kidney disease with heart failure and stage 1 through stage 4 chronic kidney disease, or unspecified chronic kidney disease: Secondary | ICD-10-CM | POA: Diagnosis not present

## 2016-05-07 DIAGNOSIS — J90 Pleural effusion, not elsewhere classified: Secondary | ICD-10-CM | POA: Diagnosis not present

## 2016-05-07 DIAGNOSIS — N183 Chronic kidney disease, stage 3 (moderate): Secondary | ICD-10-CM | POA: Diagnosis not present

## 2016-05-07 DIAGNOSIS — I5031 Acute diastolic (congestive) heart failure: Secondary | ICD-10-CM | POA: Diagnosis not present

## 2016-05-10 DIAGNOSIS — I5031 Acute diastolic (congestive) heart failure: Secondary | ICD-10-CM | POA: Diagnosis not present

## 2016-05-10 DIAGNOSIS — I130 Hypertensive heart and chronic kidney disease with heart failure and stage 1 through stage 4 chronic kidney disease, or unspecified chronic kidney disease: Secondary | ICD-10-CM | POA: Diagnosis not present

## 2016-05-10 DIAGNOSIS — J90 Pleural effusion, not elsewhere classified: Secondary | ICD-10-CM | POA: Diagnosis not present

## 2016-05-10 DIAGNOSIS — N183 Chronic kidney disease, stage 3 (moderate): Secondary | ICD-10-CM | POA: Diagnosis not present

## 2016-05-10 DIAGNOSIS — J869 Pyothorax without fistula: Secondary | ICD-10-CM | POA: Diagnosis not present

## 2016-05-10 DIAGNOSIS — E1122 Type 2 diabetes mellitus with diabetic chronic kidney disease: Secondary | ICD-10-CM | POA: Diagnosis not present

## 2016-05-12 DIAGNOSIS — J90 Pleural effusion, not elsewhere classified: Secondary | ICD-10-CM | POA: Diagnosis not present

## 2016-05-12 DIAGNOSIS — E1122 Type 2 diabetes mellitus with diabetic chronic kidney disease: Secondary | ICD-10-CM | POA: Diagnosis not present

## 2016-05-12 DIAGNOSIS — N183 Chronic kidney disease, stage 3 (moderate): Secondary | ICD-10-CM | POA: Diagnosis not present

## 2016-05-12 DIAGNOSIS — I130 Hypertensive heart and chronic kidney disease with heart failure and stage 1 through stage 4 chronic kidney disease, or unspecified chronic kidney disease: Secondary | ICD-10-CM | POA: Diagnosis not present

## 2016-05-12 DIAGNOSIS — I5031 Acute diastolic (congestive) heart failure: Secondary | ICD-10-CM | POA: Diagnosis not present

## 2016-05-12 DIAGNOSIS — J869 Pyothorax without fistula: Secondary | ICD-10-CM | POA: Diagnosis not present

## 2016-05-17 DIAGNOSIS — I130 Hypertensive heart and chronic kidney disease with heart failure and stage 1 through stage 4 chronic kidney disease, or unspecified chronic kidney disease: Secondary | ICD-10-CM | POA: Diagnosis not present

## 2016-05-17 DIAGNOSIS — N183 Chronic kidney disease, stage 3 (moderate): Secondary | ICD-10-CM | POA: Diagnosis not present

## 2016-05-17 DIAGNOSIS — J90 Pleural effusion, not elsewhere classified: Secondary | ICD-10-CM | POA: Diagnosis not present

## 2016-05-17 DIAGNOSIS — E1122 Type 2 diabetes mellitus with diabetic chronic kidney disease: Secondary | ICD-10-CM | POA: Diagnosis not present

## 2016-05-17 DIAGNOSIS — I5031 Acute diastolic (congestive) heart failure: Secondary | ICD-10-CM | POA: Diagnosis not present

## 2016-05-17 DIAGNOSIS — J869 Pyothorax without fistula: Secondary | ICD-10-CM | POA: Diagnosis not present

## 2016-05-20 ENCOUNTER — Other Ambulatory Visit: Payer: Self-pay | Admitting: Cardiology

## 2016-05-20 DIAGNOSIS — I6523 Occlusion and stenosis of bilateral carotid arteries: Secondary | ICD-10-CM

## 2016-05-21 DIAGNOSIS — E1122 Type 2 diabetes mellitus with diabetic chronic kidney disease: Secondary | ICD-10-CM | POA: Diagnosis not present

## 2016-05-21 DIAGNOSIS — I5031 Acute diastolic (congestive) heart failure: Secondary | ICD-10-CM | POA: Diagnosis not present

## 2016-05-21 DIAGNOSIS — J869 Pyothorax without fistula: Secondary | ICD-10-CM | POA: Diagnosis not present

## 2016-05-21 DIAGNOSIS — I130 Hypertensive heart and chronic kidney disease with heart failure and stage 1 through stage 4 chronic kidney disease, or unspecified chronic kidney disease: Secondary | ICD-10-CM | POA: Diagnosis not present

## 2016-05-21 DIAGNOSIS — N183 Chronic kidney disease, stage 3 (moderate): Secondary | ICD-10-CM | POA: Diagnosis not present

## 2016-05-21 DIAGNOSIS — J90 Pleural effusion, not elsewhere classified: Secondary | ICD-10-CM | POA: Diagnosis not present

## 2016-05-26 ENCOUNTER — Encounter: Payer: Self-pay | Admitting: Hematology and Oncology

## 2016-05-26 NOTE — Progress Notes (Signed)
Received PA request from [employer] for [facility]. Per their request, faxed clinical notes,labs,etc from recent office visit. Fax received ok per confirmation sheet.

## 2016-05-27 ENCOUNTER — Encounter: Payer: Self-pay | Admitting: Cardiology

## 2016-05-27 ENCOUNTER — Ambulatory Visit (HOSPITAL_COMMUNITY)
Admission: RE | Admit: 2016-05-27 | Discharge: 2016-05-27 | Disposition: A | Discharge status: Home / Self Care | Payer: Medicare Other | Source: Ambulatory Visit | Attending: Cardiology | Admitting: Cardiology

## 2016-05-27 DIAGNOSIS — I6523 Occlusion and stenosis of bilateral carotid arteries: Secondary | ICD-10-CM | POA: Insufficient documentation

## 2016-05-31 ENCOUNTER — Telehealth: Payer: Self-pay | Admitting: Cardiology

## 2016-05-31 ENCOUNTER — Encounter: Payer: Self-pay | Admitting: Cardiology

## 2016-05-31 NOTE — Progress Notes (Signed)
HPI  The patient presents for followup of his known coronary disease. He has a history of non-Hodgkin's lymphoma.  He has had PleurX catheters secondary to chronic non malignant pleural effusions.  He was last in the hospital with sepsis due to loculated fluid/empyema.  He had to have a PleurX removed and a pigtail placed per IR.   He did have acute diastolic HF and acute on chronic CKD.  I reviewed extensive hospital records for this visit.  He returns for follow up and he has done very well.  The patient denies any new symptoms such as chest discomfort, neck or arm discomfort. There has been no new shortness of breath, PND or orthopnea. There have been no reported palpitations, presyncope or syncope.  He has worked with PT.  He does wear O2 at night.   He is going to see [name] tomorrow and has follow up with oncology.  His breathing is much improved.  His weight is stable.  He walks with a cain.  The patient denies any new symptoms such as chest discomfort, neck or arm discomfort. There has been no new shortness of breath, PND or orthopnea. There have been no reported palpitations, presyncope or syncope.  Allergies  Allergen Reactions  . Glucophage [Metformin Hydrochloride] Other (See Comments)    Chest pain  . Zetia [Ezetimibe] Other (See Comments)    weakness  . Fenofibrate Rash  . Niacin-Lovastatin Er Rash    Current Outpatient Prescriptions  Medication Sig Dispense Refill  . acetaminophen (TYLENOL) 500 MG tablet Take 1,000 mg by mouth every 6 (six) hours as needed for mild pain, moderate pain or headache.     . clopidogrel (PLAVIX) 75 MG tablet Take 75 mg by mouth daily.    Marland Kitchen Dextromethorphan Polistirex (DELSYM PO) Take 15 mLs by mouth at bedtime.    Marland Kitchen eltrombopag (PROMACTA) 75 MG tablet Take 1 tablet (75 mg total) by mouth daily. Take on an empty stomach 1 hour before a meal or 2 hours after (Patient taking differently: Take 75 mg by mouth daily before lunch. Take on an empty  stomach 1 hour before a meal or 2 hours after) 30 tablet 9  . furosemide (LASIX) 40 MG tablet Take 20 mg by mouth daily.    Marland Kitchen glucose blood test strip 1 each by Other route daily. Fasting each morning 100 each 3  . hydrocortisone cream 1 % Apply 1 application topically at bedtime as needed for itching.    . Insulin Glargine (LANTUS SOLOSTAR) 100 UNIT/ML Solostar Pen INJECT 15 UNITS INTO THE SKIN EACH MORNING (Patient taking differently: Inject 15 Units into the skin daily after breakfast. INJECT 15 UNITS INTO THE SKIN EACH MO)    . meclizine (ANTIVERT) 12.5 MG tablet Take 1 tablet (12.5 mg total) by mouth 3 (three) times daily as needed for dizziness. 30 tablet 0  . metoprolol succinate (TOPROL-XL) 100 MG 24 hr tablet TAKE 1 TABLET BY MOUTH ONCE A DAY 90 tablet 3  . Multiple Vitamin (MULTIVITAMIN WITH MINERALS) TABS tablet Take 1 tablet by mouth at bedtime.    Marland Kitchen OVER THE COUNTER MEDICATION Place 1 drop into both eyes daily as needed (dry eyes). Over the counter lubricating eye drop     No current facility-administered medications for this visit.     Past Medical History:  Diagnosis Date  . Adenomatous colon polyp   . CAD (coronary artery disease)    30% LAD Stenosis, 70% ramus intermedius stenosis, treated  with PTCA and angioplasty by Dr Albertine Patricia 2004  . Cataract    right eye  . Cough, persistent 11/04/2015  . Deficiency anemia 04/19/2014  . Diverticulosis   . DM (diabetes mellitus) ([place])   . DVT (deep venous thrombosis) ([place])    secondary to surgery  . Dyslipidemia   . Hyperlipidemia   . Hypertension   . Inguinal hernia    right  . 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 Microcytic anemia 07/07/2015  . 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 Non Hodgkin's lymphoma ([employer])   . Pleural effusion, left 11/04/2015  . 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 PVD (peripheral vascular  disease) ([place])    rt renal artery stent  . Renal insufficiency   . Thrombocytopenia ([place])   . Thrombotic stroke ([place]) 09/10/2011   January 18, 2011 infarct genu & post limb R internal capsule - acute; previous lacunar infarcts/extensive white matter dis  . Vitamin D deficiency     Past Surgical History:  Procedure Laterality Date  . APPENDECTOMY     patient ?  Marland Kitchen CARDIAC CATHETERIZATION    . CHEST TUBE INSERTION Left 02/10/2016   Procedure: INSERTION PLEURAL DRAINAGE CATHETER;  Surgeon: [name], MD;  Location: [facility];  Service: Thoracic;  Laterality: Left;  . CHEST TUBE INSERTION Left 04/08/2016   Procedure: CHEST TUBE INSERTION;  Surgeon: [name], MD;  Location: [facility];  Service: Thoracic;  Laterality: Left;  . COLONOSCOPY    . CORONARY ANGIOPLASTY    . CYSTOURETHROSCOPY     ROBOTIC ARM NUCLETRON SEED IMPLANTATION OF PROSTATE  . EXPLORATORY LAPAROTOMY     For evaluation of lymphoma  . REMOVAL OF PLEURAL DRAINAGE CATHETER Left 04/08/2016   Procedure: REMOVAL OF PLEURAL DRAINAGE CATHETER;  Surgeon: [name], MD;  Location: [facility];  Service: Thoracic;  Laterality: Left;    ROS:  As stated in the HPI and negative for all other systems.  PHYSICAL EXAM BP (!) 160/80   Pulse 74   Ht 5\' 7"  (1.702 m)   Wt 183 lb (83 kg)   BMI 28.66 kg/m  GENERAL:  Frail appearing HEENT:  Pupils equal round and reactive, fundi not visualized, oral mucosa unremarkable NECK:  No jugular venous distention, waveform within normal limits, carotid upstroke brisk and symmetric, positive bilateral soft bruits versus transmitted systolic murmurs, no thyromegaly LYMPHATICS:  No cervical, inguinal adenopathy LUNGS:  Decreased breath sounds with no obvious dullness to percussion.  BACK:  No CVA tenderness CHEST:  Unremarkable  HEART:  PMI not displaced or sustained,S1 and S2 within normal limits, no S3, no S4, no clicks, no rubs, 2/6 apical systolic murmur radiating out the aortic outflow tract  short and early peaking, no diastolic murmurs ABD:  Flat, positive bowel sounds normal in frequency in pitch, no bruits, no rebound, no guarding, no midline pulsatile mass, no hepatomegaly, no splenomegaly EXT:  2 plus pulses throughout,  mild to moderate bilateral lower extremityedema, no cyanosis no clubbing SKIN:  No rashes no nodules  Lab Results  Component Value Date   CHOL 119 (L) 07/02/2015   TRIG 248 (H) 07/02/2015   HDL 19 (L) 07/02/2015   LDLCALC 50 07/02/2015   Lab Results  Component Value Date   CREATININE 2.02 (H) 04/20/2016    ASSESSMENT AND PLAN  CAD:  The  patient has no new sypmtoms.  No further cardiovascular testing is indicated.  We will continue with aggressive risk reduction and meds as listed.  CAROTID STENOSIS:  Earlier this month he had stable 1-39% RICA stenosis and stable 76-19% LICA stenosis.  We will follow this in one year.   HYPERLIPIDMEIA:  His LDL in Jan was excellent.  I will not add any meds at this point.    AS:  This was mild on echo in October.  No change in therapy is planned.    CKD:  Creat was as above.  He is going to repeat labs next week his daughter reports when he is seen in oncology.  We can defer repeat BMET until that time.   EDEMA:  We discussed conservative therapies.  This is lower than previous.   PLEURAL EFFUSION:   He has done well since his last drain was removed.  No change in therapy.

## 2016-05-31 NOTE — Telephone Encounter (Signed)
New message ° °Pt is returning call  ° °Please call back °

## 2016-06-01 ENCOUNTER — Encounter: Payer: Self-pay | Admitting: Cardiology

## 2016-06-01 ENCOUNTER — Ambulatory Visit (INDEPENDENT_AMBULATORY_CARE_PROVIDER_SITE_OTHER): Payer: Medicare Other | Admitting: Cardiology

## 2016-06-01 ENCOUNTER — Other Ambulatory Visit: Payer: Self-pay | Admitting: Cardiothoracic Surgery

## 2016-06-01 ENCOUNTER — Telehealth: Payer: Self-pay | Admitting: Cardiology

## 2016-06-01 VITALS — BP 160/80 | HR 74 | Ht 67.0 in | Wt 183.0 lb

## 2016-06-01 DIAGNOSIS — J90 Pleural effusion, not elsewhere classified: Secondary | ICD-10-CM

## 2016-06-01 DIAGNOSIS — R0602 Shortness of breath: Secondary | ICD-10-CM

## 2016-06-01 DIAGNOSIS — I2510 Atherosclerotic heart disease of native coronary artery without angina pectoris: Secondary | ICD-10-CM | POA: Diagnosis not present

## 2016-06-01 DIAGNOSIS — I6523 Occlusion and stenosis of bilateral carotid arteries: Secondary | ICD-10-CM

## 2016-06-01 NOTE — Telephone Encounter (Signed)
This is a scheduled 72month follow up appointment-pt will be there for appt

## 2016-06-01 NOTE — Telephone Encounter (Signed)
New Message  Pt daughter call requesting to speak with. Daughter states pt was called about his results, but wants to know if pt needs to keep appt for today 12/12 @4 .30. Please call back to discuss

## 2016-06-01 NOTE — Telephone Encounter (Signed)
Pt aware of his doppler result

## 2016-06-02 ENCOUNTER — Encounter: Payer: Self-pay | Admitting: Cardiology

## 2016-06-02 ENCOUNTER — Ambulatory Visit (INDEPENDENT_AMBULATORY_CARE_PROVIDER_SITE_OTHER): Payer: Medicare Other | Admitting: Cardiothoracic Surgery

## 2016-06-02 ENCOUNTER — Ambulatory Visit
Admission: RE | Admit: 2016-06-02 | Discharge: 2016-06-02 | Disposition: A | Discharge status: Home / Self Care | Payer: Medicare Other | Source: Ambulatory Visit | Attending: Cardiothoracic Surgery | Admitting: Cardiothoracic Surgery

## 2016-06-02 ENCOUNTER — Encounter: Payer: Self-pay | Admitting: Podiatry

## 2016-06-02 ENCOUNTER — Ambulatory Visit (INDEPENDENT_AMBULATORY_CARE_PROVIDER_SITE_OTHER): Payer: Medicare Other | Admitting: Podiatry

## 2016-06-02 ENCOUNTER — Encounter: Payer: Self-pay | Admitting: Cardiothoracic Surgery

## 2016-06-02 VITALS — BP 123/71 | HR 92 | Resp 20 | Ht 67.0 in | Wt 183.0 lb

## 2016-06-02 VITALS — BP 142/75 | HR 79 | Resp 18

## 2016-06-02 DIAGNOSIS — J90 Pleural effusion, not elsewhere classified: Secondary | ICD-10-CM

## 2016-06-02 DIAGNOSIS — M79676 Pain in unspecified toe(s): Secondary | ICD-10-CM

## 2016-06-02 DIAGNOSIS — I2510 Atherosclerotic heart disease of native coronary artery without angina pectoris: Secondary | ICD-10-CM

## 2016-06-02 DIAGNOSIS — B351 Tinea unguium: Secondary | ICD-10-CM | POA: Diagnosis not present

## 2016-06-02 NOTE — Progress Notes (Signed)
PCP is [name], MD Referring Provider is Tanda Rockers, MD  Chief Complaint  Patient presents with  . Pleural Effusion    1 month f/u with CXR    LOV:FIEPPIR returns for a 30 day  follow up with chest x-ray. The patient previously had a left Pleurx catheter for recurrent pleural effusion secondary to diastolic heart failure, chronic kidney disease, and previous history of lymphoma. The patient developed a left lower lobe infection with empyema and the Pleurx catheter was removed and a interventional radiology pigtail catheter was placed. After prolonged antibiotics and drainage with the pigtail catheter the infection resolved and the catheter was removed. Since that time the pleural fluid has not recurred. The patient has been doing well at home. He is walking with a cane, his weight is stable and he has no chest discomfort. He wears oxygen at night.  Today the chest x-ray shows clear lung fields, no pleural effusion or infiltrate   Past Medical History:  Diagnosis Date  . Adenomatous colon polyp   . CAD (coronary artery disease)    30% LAD Stenosis, 70% ramus intermedius stenosis, treated with PTCA and angioplasty by Dr Albertine Patricia 2004  . Cataract    right eye  . Cough, persistent 11/04/2015  . Deficiency anemia 04/19/2014  . Diverticulosis   . DM (diabetes mellitus) ([place])   . DVT (deep venous thrombosis) ([place])    secondary to surgery  . Dyslipidemia   . Hyperlipidemia   . Hypertension   . Inguinal hernia    right  . 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 Microcytic anemia 07/07/2015  . 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 Non Hodgkin's lymphoma ([place])   . Pleural effusion, left 11/04/2015  . 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 PVD (peripheral vascular disease) ([place])    rt renal artery stent  . Renal insufficiency   . Thrombocytopenia ([place])   . Thrombotic  stroke ([place]) 09/10/2011   January 18, 2011 infarct genu & post limb R internal capsule - acute; previous lacunar infarcts/extensive white matter dis  . Vitamin D deficiency     Past Surgical History:  Procedure Laterality Date  . APPENDECTOMY     patient ?  Marland Kitchen CARDIAC CATHETERIZATION    . CHEST TUBE INSERTION Left 02/10/2016   Procedure: INSERTION PLEURAL DRAINAGE CATHETER;  Surgeon: [name], MD;  Location: [facility];  Service: Thoracic;  Laterality: Left;  . CHEST TUBE INSERTION Left 04/08/2016   Procedure: CHEST TUBE INSERTION;  Surgeon: [name], MD;  Location: [facility];  Service: Thoracic;  Laterality: Left;  . COLONOSCOPY    . CORONARY ANGIOPLASTY    . CYSTOURETHROSCOPY     ROBOTIC ARM NUCLETRON SEED IMPLANTATION OF PROSTATE  . EXPLORATORY LAPAROTOMY     For evaluation of lymphoma  . REMOVAL OF PLEURAL DRAINAGE CATHETER Left 04/08/2016   Procedure: REMOVAL OF PLEURAL DRAINAGE CATHETER;  Surgeon: [name], MD;  Location: [facility] OR;  Service: Thoracic;  Laterality: Left;    Family History  Problem Relation Age of Onset  . Lymphoma Sister   . Prostate cancer Brother   . Breast cancer Sister   . Diabetes Brother   . Diabetes Sister   . Heart disease Brother   . Asthma Son     Social History Social History  Substance Use Topics  .  Smoking status: Former Smoker    Packs/day: 0.50    Years: 20.00    Types: Pipe, Cigarettes    Quit date: 06/21/1958  . Smokeless tobacco: Never Used  . Alcohol use No    Current Outpatient Prescriptions  Medication Sig Dispense Refill  . acetaminophen (TYLENOL) 500 MG tablet Take 1,000 mg by mouth every 6 (six) hours as needed for mild pain, moderate pain or headache.     . clopidogrel (PLAVIX) 75 MG tablet Take 75 mg by mouth daily.    . Cyanocobalamin (B-12 COMPLIANCE INJECTION IJ) Inject as directed every 30 (thirty) days.    . Dextromethorphan Polistirex (DELSYM PO) Take 15 mLs by mouth at bedtime.    Marland Kitchen eltrombopag (PROMACTA) 75  MG tablet Take 1 tablet (75 mg total) by mouth daily. Take on an empty stomach 1 hour before a meal or 2 hours after (Patient taking differently: Take 75 mg by mouth daily before lunch. Take on an empty stomach 1 hour before a meal or 2 hours after) 30 tablet 9  . furosemide (LASIX) 40 MG tablet Take 20 mg by mouth daily.    Marland Kitchen glucose blood test strip 1 each by Other route daily. Fasting each morning 100 each 3  . hydrocortisone cream 1 % Apply 1 application topically at bedtime as needed for itching.    . Insulin Glargine (LANTUS SOLOSTAR) 100 UNIT/ML Solostar Pen INJECT 15 UNITS INTO THE SKIN EACH MORNING (Patient taking differently: Inject 15 Units into the skin daily after breakfast. INJECT 15 UNITS INTO THE SKIN EACH MO)    . meclizine (ANTIVERT) 12.5 MG tablet Take 1 tablet (12.5 mg total) by mouth 3 (three) times daily as needed for dizziness. 30 tablet 0  . metoprolol succinate (TOPROL-XL) 100 MG 24 hr tablet TAKE 1 TABLET BY MOUTH ONCE A DAY 90 tablet 3  . Multiple Vitamin (MULTIVITAMIN WITH MINERALS) TABS tablet Take 1 tablet by mouth at bedtime.    Marland Kitchen OVER THE COUNTER MEDICATION Place 1 drop into both eyes daily as needed (dry eyes). Over the counter lubricating eye drop     No current facility-administered medications for this visit.     Allergies  Allergen Reactions  . Glucophage [Metformin Hydrochloride] Other (See Comments)    Chest pain  . Zetia [Ezetimibe] Other (See Comments)    weakness  . Fenofibrate Rash  . Niacin-Lovastatin Er Rash    Review of Systems  Appetite is fair weight is stable No fever No edema No hemoptysis No left chest wall pain or tenderness, Pleurx incision is well-healed  BP 123/71   Pulse 92   Resp 20   Ht 5\' 7"  (1.702 m)   Wt 183 lb (83 kg)   SpO2 93% Comment: RA  BMI 28.66 kg/m  Physical Exam Alert and comfortable No JVD Breath sounds clear and equal Heart rhythm regular Neuro intact with generalized weakness  Diagnostic  Tests: Chest x-ray is clear  Impression: Resolved left pleural effusion after removal Pleurx catheter No further treatment needed  Plan: Return as needed  Len Childs, MD [facility] [phone]

## 2016-06-02 NOTE — Progress Notes (Signed)
Patient ID: Ricardo Watkins, male   DOB: [birth year], 82 y.o.   MRN: [id number]  Subjective: This patient presents for scheduled visit and request debridement of painful toenails which are uncomfortable when walking wearing shoes. The patient's wife is present in the treatment room today  Objective: Orientated 3 Peripheral pitting edema bilaterally DP pulses 0/4 bilaterally PT pulses 1/4 bilaterally Sensation to 10 g monofilament wire intact 5/5 bilaterally Vibratory sensation reactive bilaterally Ankle reflex equal and reactive bilaterally No deformities bilaterally Manual motor testing dorsi flexion, plantar flexion 5/5 bilaterally No open skin lesions bilaterally The toenails are hypertrophic, elongated, incurvated, discolored and tender direct palpation 6-10  Assessment: Diabetic with peripheral arterial disease Symptomatic onychomycoses 6-10  Plan: Debridement toenails 10 and mechanically and electrically without any bleeding   Reappoint 3 months

## 2016-06-02 NOTE — Patient Instructions (Signed)

## 2016-06-04 ENCOUNTER — Ambulatory Visit (INDEPENDENT_AMBULATORY_CARE_PROVIDER_SITE_OTHER): Payer: Medicare Other

## 2016-06-04 DIAGNOSIS — E538 Deficiency of other specified B group vitamins: Secondary | ICD-10-CM

## 2016-06-04 MED ORDER — CYANOCOBALAMIN 1000 MCG/ML IJ SOLN
1000.0000 ug | Freq: Once | INTRAMUSCULAR | Status: AC
  Administered 2016-06-04: 1000 ug via INTRAMUSCULAR

## 2016-06-07 ENCOUNTER — Other Ambulatory Visit: Payer: Self-pay | Admitting: Family Medicine

## 2016-06-08 ENCOUNTER — Encounter: Payer: Self-pay | Admitting: Hematology and Oncology

## 2016-06-08 NOTE — Progress Notes (Signed)
Received approval from [facility] for Promacta 75mg .  Approval 05/27/16-05/28/17. Called [employer](Kashonya) to confirm they received approval. She states they received on 05/29/16 and shipment went out on 06/01/16.

## 2016-06-09 ENCOUNTER — Other Ambulatory Visit: Payer: Self-pay | Admitting: Family Medicine

## 2016-06-09 MED ORDER — INSULIN GLARGINE 100 UNIT/ML SOLOSTAR PEN: PEN_INJECTOR | SUBCUTANEOUS | 3 refills | Status: DC

## 2016-06-16 ENCOUNTER — Emergency Department (HOSPITAL_COMMUNITY)
Admission: EM | Admit: 2016-06-16 | Discharge: 2016-06-16 | Disposition: A | Discharge status: Home / Self Care | Payer: Medicare Other | Attending: Emergency Medicine | Admitting: Emergency Medicine

## 2016-06-16 ENCOUNTER — Emergency Department (HOSPITAL_COMMUNITY): Payer: Medicare Other

## 2016-06-16 ENCOUNTER — Encounter (HOSPITAL_COMMUNITY): Payer: Self-pay | Admitting: Emergency Medicine

## 2016-06-16 ENCOUNTER — Ambulatory Visit (INDEPENDENT_AMBULATORY_CARE_PROVIDER_SITE_OTHER): Payer: Medicare Other | Admitting: Family Medicine

## 2016-06-16 ENCOUNTER — Encounter: Payer: Self-pay | Admitting: Family Medicine

## 2016-06-16 VITALS — BP 156/80 | HR 84 | Temp 97.9°F | Ht 69.0 in | Wt 189.0 lb

## 2016-06-16 DIAGNOSIS — R0602 Shortness of breath: Secondary | ICD-10-CM | POA: Diagnosis not present

## 2016-06-16 DIAGNOSIS — Z79899 Other long term (current) drug therapy: Secondary | ICD-10-CM | POA: Diagnosis not present

## 2016-06-16 DIAGNOSIS — N183 Chronic kidney disease, stage 3 (moderate): Secondary | ICD-10-CM | POA: Insufficient documentation

## 2016-06-16 DIAGNOSIS — E1122 Type 2 diabetes mellitus with diabetic chronic kidney disease: Secondary | ICD-10-CM | POA: Diagnosis not present

## 2016-06-16 DIAGNOSIS — Z8546 Personal history of malignant neoplasm of prostate: Secondary | ICD-10-CM | POA: Insufficient documentation

## 2016-06-16 DIAGNOSIS — Z87891 Personal history of nicotine dependence: Secondary | ICD-10-CM | POA: Insufficient documentation

## 2016-06-16 DIAGNOSIS — I5031 Acute diastolic (congestive) heart failure: Secondary | ICD-10-CM | POA: Diagnosis not present

## 2016-06-16 DIAGNOSIS — I2510 Atherosclerotic heart disease of native coronary artery without angina pectoris: Secondary | ICD-10-CM

## 2016-06-16 DIAGNOSIS — Z955 Presence of coronary angioplasty implant and graft: Secondary | ICD-10-CM | POA: Insufficient documentation

## 2016-06-16 DIAGNOSIS — R079 Chest pain, unspecified: Secondary | ICD-10-CM | POA: Diagnosis not present

## 2016-06-16 DIAGNOSIS — Z794 Long term (current) use of insulin: Secondary | ICD-10-CM | POA: Diagnosis not present

## 2016-06-16 DIAGNOSIS — J810 Acute pulmonary edema: Secondary | ICD-10-CM | POA: Insufficient documentation

## 2016-06-16 DIAGNOSIS — I130 Hypertensive heart and chronic kidney disease with heart failure and stage 1 through stage 4 chronic kidney disease, or unspecified chronic kidney disease: Secondary | ICD-10-CM | POA: Diagnosis not present

## 2016-06-16 DIAGNOSIS — R0789 Other chest pain: Secondary | ICD-10-CM | POA: Diagnosis present

## 2016-06-16 LAB — CBC
HCT: 37.1 % — ABNORMAL LOW (ref 39.0–52.0)
Hemoglobin: 10.7 g/dL — ABNORMAL LOW (ref 13.0–17.0)
MCH: 24.6 pg — AB (ref 26.0–34.0)
MCHC: 28.8 g/dL — ABNORMAL LOW (ref 30.0–36.0)
MCV: 85.3 fL (ref 78.0–100.0)
PLATELETS: 34 10*3/uL — AB (ref 150–400)
RBC: 4.35 MIL/uL (ref 4.22–5.81)
RDW: 25.5 % — ABNORMAL HIGH (ref 11.5–15.5)
WBC: 3.3 10*3/uL — ABNORMAL LOW (ref 4.0–10.5)

## 2016-06-16 LAB — I-STAT TROPONIN, ED: TROPONIN I, POC: 0 ng/mL (ref 0.00–0.08)

## 2016-06-16 LAB — BRAIN NATRIURETIC PEPTIDE: B Natriuretic Peptide: 457.5 pg/mL — ABNORMAL HIGH (ref 0.0–100.0)

## 2016-06-16 MED ORDER — FUROSEMIDE 20 MG PO TABS
40.0000 mg | ORAL_TABLET | Freq: Once | ORAL | Status: AC
  Administered 2016-06-16: 40 mg via ORAL
  Filled 2016-06-16: qty 2

## 2016-06-16 NOTE — ED Triage Notes (Signed)
Pt here with mid sternal chest tightness starting yesterday with some leg swelling; pt denies SOB but sts some trouble laying flat

## 2016-06-16 NOTE — Discharge Instructions (Signed)
Resume taking the Lasix, at 40 mg each day, beginning tomorrow morning, to improve your urinary output.  Return here, if needed, for problems.

## 2016-06-16 NOTE — Progress Notes (Signed)
Subjective:    Patient ID: Ricardo Watkins, male    DOB: February 18, 1931, 82 y.o.   MRN: [id number]  HPI Patient saw Dr. Lucianne Lei Tright 06/02/16.  I have copied part of his history of present illness to provide a brief synopsis of his recent medical history with lung empyema:  "The patient previously had a left Pleurx catheter for recurrent pleural effusion secondary to diastolic heart failure, chronic kidney disease, and previous history of lymphoma. The patient developed a left lower lobe infection with empyema and the Pleurx catheter was removed and a interventional radiology pigtail catheter was placed. After prolonged antibiotics and drainage with the pigtail catheter the infection resolved and the catheter was removed. Since that time the pleural fluid has not recurred. The patient has been doing well at home. He is walking with a cane, his weight is stable and he has no chest discomfort. He wears oxygen at night."  06/16/16  Presents complaining of feeling "sick."  Patient states that over the last 3 days he's had increasing shortness of breath. He reports orthopnea. He is having to sleep sitting upright in a chair to help himself breathe. His weight is up 6 pounds from his last office visit. He has +2 pitting edema in both legs distal to his knees. He denies any fevers or chills but he does report a cough that is worse when he is supine. The cough is nonproductive. EKG today shows normal sinus rhythm with occasional PVCs and nonspecific ST changes in the lateral leads 1 and aVL. This is essentially unchanged from his EKG in October. Unfortunately now the patient is also reporting occasional chest tightness. This is substernal in location. He states it feels like a pressure in his chest. It comes and goes without provocation. It can last minutes to hours. At the present time he states that he is pain-free but this comes and goes frequently over the last 2 days Past Medical History:  Diagnosis Date  .  Adenomatous colon polyp   . CAD (coronary artery disease)    30% LAD Stenosis, 70% ramus intermedius stenosis, treated with PTCA and angioplasty by Dr Albertine Patricia 2004  . Cataract    right eye  . Cough, persistent 11/04/2015  . Deficiency anemia 04/19/2014  . Diverticulosis   . DM (diabetes mellitus) ([place])   . DVT (deep venous thrombosis) ([place])    secondary to surgery  . Dyslipidemia   . Hyperlipidemia   . Hypertension   . Inguinal hernia    right  . 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 Microcytic anemia 07/07/2015  . 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 Non Hodgkin's lymphoma ([place])   . Pleural effusion, left 11/04/2015  . 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 PVD (peripheral vascular disease) ([place])    rt renal artery stent  . Renal insufficiency   . Thrombocytopenia ([place])   . Thrombotic stroke ([place]) 09/10/2011   January 18, 2011 infarct genu & post limb R internal capsule - acute; previous lacunar infarcts/extensive white matter dis  . Vitamin D deficiency    Past Surgical History:  Procedure Laterality Date  . APPENDECTOMY     patient ?  Marland Kitchen CARDIAC CATHETERIZATION    . CHEST TUBE INSERTION Left 02/10/2016   Procedure: INSERTION PLEURAL DRAINAGE CATHETER;  Surgeon: [name], MD;  Location: [facility];  Service: Thoracic;  Laterality: Left;  . CHEST TUBE INSERTION Left 04/08/2016   Procedure: CHEST TUBE INSERTION;  Surgeon: [name], MD;  Location: [facility];  Service: Thoracic;  Laterality: Left;  . COLONOSCOPY    . CORONARY ANGIOPLASTY    . CYSTOURETHROSCOPY     ROBOTIC ARM NUCLETRON SEED IMPLANTATION OF PROSTATE  . EXPLORATORY LAPAROTOMY     For evaluation of lymphoma  . REMOVAL OF PLEURAL DRAINAGE CATHETER Left 04/08/2016   Procedure: REMOVAL OF PLEURAL DRAINAGE CATHETER;  Surgeon: [name], MD;  Location: [facility] OR;  Service: Thoracic;  Laterality: Left;    Current Outpatient Prescriptions on File Prior to Visit  Medication Sig Dispense Refill  . acetaminophen (TYLENOL) 500 MG tablet Take 1,000 mg by mouth every 6 (six) hours as needed for mild pain, moderate pain or headache.     . clopidogrel (PLAVIX) 75 MG tablet Take 75 mg by mouth daily.    . Cyanocobalamin (B-12 COMPLIANCE INJECTION IJ) Inject as directed every 30 (thirty) days.    . Dextromethorphan Polistirex (DELSYM PO) Take 15 mLs by mouth at bedtime.    Marland Kitchen eltrombopag (PROMACTA) 75 MG tablet Take 1 tablet (75 mg total) by mouth daily. Take on an empty stomach 1 hour before a meal or 2 hours after (Patient taking differently: Take 75 mg by mouth daily before lunch. Take on an empty stomach 1 hour before a meal or 2 hours after) 30 tablet 9  . furosemide (LASIX) 40 MG tablet Take 20 mg by mouth daily.    Marland Kitchen glucose blood test strip 1 each by Other route daily. Fasting each morning 100 each 3  . hydrocortisone cream 1 % Apply 1 application topically at bedtime as needed for itching.    . Insulin Glargine (LANTUS SOLOSTAR) 100 UNIT/ML Solostar Pen INJECT 25 UNITS EVERY NIGHT AT BEDTIME 5 pen 3  . meclizine (ANTIVERT) 12.5 MG tablet Take 1 tablet (12.5 mg total) by mouth 3 (three) times daily as needed for dizziness. 30 tablet 0  . metoprolol succinate (TOPROL-XL) 100 MG 24 hr tablet TAKE 1 TABLET BY MOUTH ONCE A DAY 90 tablet 3  . Multiple Vitamin (MULTIVITAMIN WITH MINERALS) TABS tablet Take 1 tablet by mouth at bedtime.    Marland Kitchen OVER THE COUNTER MEDICATION Place 1 drop into both eyes daily as needed (dry eyes). Over the counter lubricating eye drop     No current facility-administered medications on file prior to visit.    Allergies  Allergen Reactions  . Glucophage [Metformin Hydrochloride] Other (See Comments)    Chest pain  . Zetia [Ezetimibe] Other (See Comments)    weakness  . Fenofibrate Rash  . Niacin-Lovastatin Er Rash   Social History   Social History  . Marital status:  Married    Spouse name: N/A  . Number of children: 4  . Years of education: N/A   Occupational History  . retired    Social History Main Topics  . Smoking status: Former Smoker    Packs/day: 0.50    Years: 20.00    Types: Pipe, Cigarettes    Quit date: 06/21/1958  . Smokeless tobacco: Never Used  . Alcohol use No  . Drug use: No  . Sexual activity: Yes    Partners: Female   Other Topics Concern  . Not on file   Social History Narrative  . No narrative on file      Review of Systems  All other systems reviewed and are  negative.      Objective:   Physical Exam  Constitutional: He appears well-developed and well-nourished. No distress.  HENT:  Right Ear: External ear normal.  Left Ear: External ear normal.  Nose: Nose normal.  Mouth/Throat: Oropharynx is clear and moist. No oropharyngeal exudate.  Eyes: Conjunctivae are normal.  Neck: Neck supple. No JVD present.  Cardiovascular: Normal rate and normal heart sounds.   Pulmonary/Chest: Effort normal. He has no wheezes. He has rales.  Abdominal: Soft. Bowel sounds are normal.  Musculoskeletal: He exhibits edema.  Lymphadenopathy:    He has no cervical adenopathy.  Skin: He is not diaphoretic.  Vitals reviewed.         Assessment & Plan:  Chest pain, unspecified type - Plan: EKG 12-Lead  Patient's shortness of breath and orthopnea are concerning for congestive heart failure. On his exam today he does appear to be fluid overloaded. I see no evidence of pneumonia or recurrent left pleural effusion on his exam. His chest tightness is concerning for cardiac ischemia or possibly demand ischemia secondary to heart failure. Therefore I believe the patient needs ER evaluation for cardiac enzymes, chest x-ray, diuresis. Also given his history of cancer, pulmonary embolism is on the differential diagnosis. At the present time he is clinically stable. This is been going on for 3 days. Therefore I do not believe that the  patient needs ambulance transport. He is comfortable going to the hospital with his daughter driving him.

## 2016-06-16 NOTE — ED Provider Notes (Signed)
[facility] DEPT Provider Note   CSN: [id number] Arrival date & time: 06/16/16  1126     History   Chief Complaint Chief Complaint  Patient presents with  . Chest Pain    HPI Ricardo Watkins is a 82 y.o. male.  He presents for evaluation of chest discomfort with some trouble breathing. He saw his PCP, this morning was concerned that he had a recurrence of both cardiac and pulmonary problems, therefore sent in here for more advanced evaluation. Patient's shortness of breath has been worse at nighttime for the last several days. He is also noted leg swelling. His PCP feels like his weight is up 6 pounds from baseline. The patient's physician, chest surgeon, apparently lowered his Lasix dose from 40-20 mg daily, on 05/05/2016. This is by report of family members. Apparently at that time, he was having excessive urination, including at night that was bothersome. Patient reports having several hours of chest heaviness, last night, but none today. He took his usual medications today. There are no other known modifying factors.  HPI  Past Medical History:  Diagnosis Date  . Adenomatous colon polyp   . CAD (coronary artery disease)    30% LAD Stenosis, 70% ramus intermedius stenosis, treated with PTCA and angioplasty by Dr Albertine Patricia 2004  . Cataract    right eye  . Cough, persistent 11/04/2015  . Deficiency anemia 04/19/2014  . Diverticulosis   . DM (diabetes mellitus) ([place])   . DVT (deep venous thrombosis) ([place])    secondary to surgery  . Dyslipidemia   . Hyperlipidemia   . Hypertension   . Inguinal hernia    right  . 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 Microcytic anemia 07/07/2015  . 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 Non Hodgkin's lymphoma ([place])   . Pleural effusion, left 11/04/2015  . Prostate CA ([place]) 09/10/2011   Gleason 3+3 R, 3+4 L lobe May 2007 Rx Radioactive seed implants Dr [name]  . PVD (peripheral vascular disease) ([place])    rt renal artery stent  . Renal insufficiency   . Thrombocytopenia ([place])   . 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 Vitamin D deficiency     Patient Active Problem List   Diagnosis Date Noted  . B12 deficiency anemia   . Chest tube in place   . Acute diastolic CHF (congestive heart failure) ([place]) 04/16/2016  . Acute respiratory failure with hypoxia ([place]) 04/14/2016  . Empyema lung ([place])   . Type 2 diabetes mellitus ([place]) 04/04/2016  . Empyema ([place]) 04/03/2016  . Thyroid nodule 03/03/2016  . Sepsis ([place]) 02/17/2016  . Dyspnea 01/27/2016  . Pulmonary edema with congestive heart failure ([place]) 11/12/2015  . Cough, persistent 11/04/2015  . Pleural effusion 11/04/2015  . Anorexia 08/08/2015  . Microcytic anemia 07/07/2015  . Pancytopenia ([place]) 04/08/2015  . Diarrhea 04/08/2015  . Encounter for chemotherapy management 04/02/2015  . Neutropenia ([place]) 02/18/2015  . 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 Deficiency anemia 04/19/2014  . Thrombocytopenia ([place]) 04/19/2014  . Vitamin B12 deficiency 04/19/2014  . Chronic renal failure, stage 3 (moderate) 04/19/2014  . Macrocytic anemia 03/20/2013  . Monocytosis 03/20/2013  . Thrombotic stroke ([place]) 09/10/2011  . Prostate CA ([place]) 09/10/2011  . Inguinal hernia   . Colon polyps   . CAD (coronary  artery disease) 08/26/2010  . HTN (hypertension) 08/26/2010  . Murmur 08/26/2010  . MURMUR 08/26/2010  . History of lymphoma 08/25/2010  . DM 08/25/2010  . DYSLIPIDEMIA 08/25/2010  . THROMBOCYTOPENIA 08/25/2010  . Essential hypertension 08/25/2010  . Coronary atherosclerosis 08/25/2010  . PVD 08/25/2010    Past Surgical History:  Procedure Laterality Date  . APPENDECTOMY     patient ?  Marland Kitchen CARDIAC CATHETERIZATION    . CHEST TUBE INSERTION Left 02/10/2016   Procedure: INSERTION PLEURAL  DRAINAGE CATHETER;  Surgeon: [name], MD;  Location: [facility];  Service: Thoracic;  Laterality: Left;  . CHEST TUBE INSERTION Left 04/08/2016   Procedure: CHEST TUBE INSERTION;  Surgeon: [name], MD;  Location: [facility];  Service: Thoracic;  Laterality: Left;  . COLONOSCOPY    . CORONARY ANGIOPLASTY    . CYSTOURETHROSCOPY     ROBOTIC ARM NUCLETRON SEED IMPLANTATION OF PROSTATE  . EXPLORATORY LAPAROTOMY     For evaluation of lymphoma  . REMOVAL OF PLEURAL DRAINAGE CATHETER Left 04/08/2016   Procedure: REMOVAL OF PLEURAL DRAINAGE CATHETER;  Surgeon: [name], MD;  Location: [facility];  Service: Thoracic;  Laterality: Left;       Home Medications    Prior to Admission medications   Medication Sig Start Date End Date Taking? Authorizing Provider  acetaminophen (TYLENOL) 500 MG tablet Take 1,000 mg by mouth every 6 (six) hours as needed for mild pain, moderate pain or headache.     Historical Provider, MD  clopidogrel (PLAVIX) 75 MG tablet Take 75 mg by mouth daily.    Historical Provider, MD  Cyanocobalamin (B-12 COMPLIANCE INJECTION IJ) Inject as directed every 30 (thirty) days.    Historical Provider, MD  Dextromethorphan Polistirex (DELSYM PO) Take 15 mLs by mouth at bedtime.    Historical Provider, MD  eltrombopag (PROMACTA) 75 MG tablet Take 1 tablet (75 mg total) by mouth daily. Take on an empty stomach 1 hour before a meal or 2 hours after Patient taking differently: Take 75 mg by mouth daily before lunch. Take on an empty stomach 1 hour before a meal or 2 hours after 03/29/16   Heath Lark, MD  furosemide (LASIX) 40 MG tablet Take 20 mg by mouth daily.    Historical Provider, MD  glucose blood test strip 1 each by Other route daily. Fasting each morning 10/21/15   [name], MD  hydrocortisone cream 1 % Apply 1 application topically at bedtime as needed for itching.    Historical Provider, MD  Insulin Glargine (LANTUS SOLOSTAR) 100 UNIT/ML Solostar Pen INJECT 25 UNITS  EVERY NIGHT AT BEDTIME 06/09/16   [name], MD  meclizine (ANTIVERT) 12.5 MG tablet Take 1 tablet (12.5 mg total) by mouth 3 (three) times daily as needed for dizziness. 06/27/15   [name], MD  metoprolol succinate (TOPROL-XL) 100 MG 24 hr tablet TAKE 1 TABLET BY MOUTH ONCE A DAY 12/11/15   [name], MD  Multiple Vitamin (MULTIVITAMIN WITH MINERALS) TABS tablet Take 1 tablet by mouth at bedtime.    Historical Provider, MD  OVER THE COUNTER MEDICATION Place 1 drop into both eyes daily as needed (dry eyes). Over the counter lubricating eye drop    Historical Provider, MD    Family History Family History  Problem Relation Age of Onset  . Lymphoma Sister   . Prostate cancer Brother   . Breast cancer Sister   . Diabetes Brother   . Diabetes Sister   .  Heart disease Brother   . Asthma Son     Social History Social History  Substance Use Topics  . Smoking status: Former Smoker    Packs/day: 0.50    Years: 20.00    Types: Pipe, Cigarettes    Quit date: 06/21/1958  . Smokeless tobacco: Never Used  . Alcohol use No     Allergies   Glucophage [metformin hydrochloride]; Zetia [ezetimibe]; Fenofibrate; and Niacin-lovastatin er   Review of Systems Review of Systems  All other systems reviewed and are negative.    Physical Exam Updated Vital Signs BP 152/92   Pulse 97   Temp 97.8 F (36.6 C) (Oral)   Resp 20   Wt 186 lb 14.4 oz (84.8 kg)   SpO2 93%   BMI 27.60 kg/m   Physical Exam  Constitutional: He is oriented to person, place, and time. He appears well-developed and well-nourished.  HENT:  Head: Normocephalic and atraumatic.  Right Ear: External ear normal.  Left Ear: External ear normal.  Eyes: Conjunctivae and EOM are normal. Pupils are equal, round, and reactive to light.  Neck: Normal range of motion and phonation normal. Neck supple.  Cardiovascular: Normal rate, regular rhythm and normal heart sounds.   Pulmonary/Chest: Effort normal and  breath sounds normal. He exhibits no bony tenderness.  Abdominal: Soft. There is no tenderness.  Musculoskeletal: Normal range of motion.  Neurological: He is alert and oriented to person, place, and time. No cranial nerve deficit or sensory deficit. He exhibits normal muscle tone. Coordination normal.  Skin: Skin is warm, dry and intact.  Psychiatric: He has a normal mood and affect. His behavior is normal. Judgment and thought content normal.  Nursing note and vitals reviewed.    ED Treatments / Results  Labs (all labs ordered are listed, but only abnormal results are displayed) Labs Reviewed  CBC - Abnormal; Notable for the following:       Result Value   WBC 3.3 (*)    Hemoglobin 10.7 (*)    HCT 37.1 (*)    MCH 24.6 (*)    MCHC 28.8 (*)    RDW 25.5 (*)    Platelets 34 (*)    All other components within normal limits  BRAIN NATRIURETIC PEPTIDE - Abnormal; Notable for the following:    B Natriuretic Peptide 457.5 (*)    All other components within normal limits  I-STAT TROPOININ, ED    EKG  EKG Interpretation  Date/Time:  Wednesday June 16 2016 11:32:21 EST Ventricular Rate:  84 PR Interval:  140 QRS Duration: 86 QT Interval:  410 QTC Calculation: 484 R Axis:   44 Text Interpretation:  Normal sinus rhythm Septal infarct , age undetermined Abnormal ECG since last tracing no significant change Confirmed by Eulis Foster  MD, Jakaiden Fill [phone]) on 06/16/2016 1:17:19 PM       Radiology Dg Chest 2 View  Result Date: 06/16/2016 CLINICAL DATA:  Mid chest pain. EXAM: CHEST  2 VIEW COMPARISON:  June 02, 2016 FINDINGS: The small left pleural effusion and underlying opacity are similar in the interval. There is probably a tiny right effusion as well with atelectasis. No pneumothorax. No other interval changes. IMPRESSION: Small bilateral effusions, left greater than right, with underlying atelectasis. The findings are similar in the interval. Electronically Signed   By: Dorise Bullion III M.D   On: 06/16/2016 12:10    Procedures Procedures (including critical care time)  Medications Ordered in ED Medications  furosemide (LASIX) tablet 40 mg (  40 mg Oral Given 06/16/16 1447)     Initial Impression / Assessment and Plan / ED Course  I have reviewed the triage vital signs and the nursing notes.  Pertinent labs & imaging results that were available during my care of the patient were reviewed by me and considered in my medical decision making (see chart for details).  Clinical Course      Medications  furosemide (LASIX) tablet 40 mg (40 mg Oral Given 06/16/16 1447)    Patient Vitals for the past 24 hrs:  BP Temp Temp src Pulse Resp SpO2 Weight  06/16/16 1446 152/92 - - 97 20 93 % -  06/16/16 1358 151/88 - - 81 22 94 % -  06/16/16 1320 - - - - - - 186 lb 14.4 oz (84.8 kg)  06/16/16 1141 132/68 97.8 F (36.6 C) Oral 71 18 96 % -    3:15 PM Reevaluation with update and discussion. After initial assessment and treatment, an updated evaluation reveals No change in clinical status. Findings discussed with patient and family members, all questions were answered. Hilding Quintanar L    Final Clinical Impressions(s) / ED Diagnoses   Final diagnoses:  Acute pulmonary edema (HCC)    Dyspnea related to mild pulmonary edema, without  significant cardiopulmonary compromise. Doubt ACS, PE or pneumonia. Patient recently (5 weeks ago) had his Lasix dose lowered secondary to urinary frequency. The Lasix may need further titration, which can be done expectantly.  Nursing Notes Reviewed/ Care Coordinated Applicable Imaging Reviewed Interpretation of Laboratory Data incorporated into ED treatment  The patient appears reasonably screened and/or stabilized for discharge and I doubt any other medical condition or other [facility] requiring further screening, evaluation, or treatment in the ED at this time prior to discharge.  Plan: Home Medications- resume 40 mg each day; Home  Treatments- rest, leg elevation; return here if the recommended treatment, does not improve the symptoms; Recommended follow up- PCP, one week  New Prescriptions New Prescriptions   No medications on file     Daleen Bo, MD 06/16/16 1517

## 2016-06-24 ENCOUNTER — Encounter: Payer: Self-pay | Admitting: Family Medicine

## 2016-06-24 ENCOUNTER — Ambulatory Visit (INDEPENDENT_AMBULATORY_CARE_PROVIDER_SITE_OTHER): Payer: Medicare Other | Admitting: Family Medicine

## 2016-06-24 VITALS — BP 122/74 | HR 82 | Temp 97.4°F | Resp 16 | Ht 69.0 in | Wt 187.0 lb

## 2016-06-24 DIAGNOSIS — I5021 Acute systolic (congestive) heart failure: Secondary | ICD-10-CM | POA: Diagnosis not present

## 2016-06-24 DIAGNOSIS — R0601 Orthopnea: Secondary | ICD-10-CM

## 2016-06-24 NOTE — Progress Notes (Signed)
Subjective:    Patient ID: Ricardo Watkins, male    DOB: [birth year], 83 y.o.   MRN: [id number]  HPI Patient saw Dr. Lucianne Lei Tright 06/02/16.  I have copied part of his history of present illness to provide a brief synopsis of his recent medical history with lung empyema:  "The patient previously had a left Pleurx catheter for recurrent pleural effusion secondary to diastolic heart failure, chronic kidney disease, and previous history of lymphoma. The patient developed a left lower lobe infection with empyema and the Pleurx catheter was removed and a interventional radiology pigtail catheter was placed. After prolonged antibiotics and drainage with the pigtail catheter the infection resolved and the catheter was removed. Since that time the pleural fluid has not recurred. The patient has been doing well at home. He is walking with a cane, his weight is stable and he has no chest discomfort. He wears oxygen at night."  06/16/16  Presents complaining of feeling "sick."  Patient states that over the last 3 days he's had increasing shortness of breath. He reports orthopnea. He is having to sleep sitting upright in a chair to help himself breathe. His weight is up 6 pounds from his last office visit. He has +2 pitting edema in both legs distal to his knees. He denies any fevers or chills but he does report a cough that is worse when he is supine. The cough is nonproductive. EKG today shows normal sinus rhythm with occasional PVCs and nonspecific ST changes in the lateral leads 1 and aVL. This is essentially unchanged from his EKG in October. Unfortunately now the patient is also reporting occasional chest tightness. This is substernal in location. He states it feels like a pressure in his chest. It comes and goes without provocation. It can last minutes to hours. At the present time he states that he is pain-free but this comes and goes frequently over the last 2 days.  At that time, my plan was: Patient's  shortness of breath and orthopnea are concerning for congestive heart failure. On his exam today he does appear to be fluid overloaded. I see no evidence of pneumonia or recurrent left pleural effusion on his exam. His chest tightness is concerning for cardiac ischemia or possibly demand ischemia secondary to heart failure. Therefore I believe the patient needs ER evaluation for cardiac enzymes, chest x-ray, diuresis. Also given his history of cancer, pulmonary embolism is on the differential diagnosis. At the present time he is clinically stable. This is been going on for 3 days. Therefore I do not believe that the patient needs ambulance transport. He is comfortable going to the hospital with his daughter driving him.  06/24/16 Patient has lost 2 pounds since his last visit. He was seen at the emergency room where his BNP was over 400. His chest x-ray also showed small bilateral pleural effusions. Evaluation suggested pulmonary edema. His Lasix was increased from 20 mg a day to 40 mg a day. He has been taking 40 mg a day since that time. He reports that he feels some better but he is still having orthopnea and having to sleep in a recliner to help him breathe. He still has pitting edema on his exam. He also still has bibasilar crackles right greater than left. I believe his dry weight is near 182 pounds Past Medical History:  Diagnosis Date  . Adenomatous colon polyp   . CAD (coronary artery disease)    30% LAD Stenosis, 70% ramus intermedius stenosis,  treated with PTCA and angioplasty by Dr Albertine Patricia 2004  . Cataract    right eye  . Cough, persistent 11/04/2015  . Deficiency anemia 04/19/2014  . Diverticulosis   . DM (diabetes mellitus) ([place])   . DVT (deep venous thrombosis) ([place])    secondary to surgery  . Dyslipidemia   . Hyperlipidemia   . Hypertension   . Inguinal hernia    right  . 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 Microcytic anemia 07/07/2015  . Monocytosis  03/20/2013   Suspect chemo related MDS  . Non Hodgkin's lymphoma ([place])   . Pleural effusion, left 11/04/2015  . 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 PVD (peripheral vascular disease) ([place])    rt renal artery stent  . Renal insufficiency   . Thrombocytopenia ([place])   . Thrombotic stroke ([place]) 09/10/2011   January 18, 2011 infarct genu & post limb R internal capsule - acute; previous lacunar infarcts/extensive white matter dis  . Vitamin D deficiency    Past Surgical History:  Procedure Laterality Date  . APPENDECTOMY     patient ?  Marland Kitchen CARDIAC CATHETERIZATION    . CHEST TUBE INSERTION Left 02/10/2016   Procedure: INSERTION PLEURAL DRAINAGE CATHETER;  Surgeon: [name], MD;  Location: [facility];  Service: Thoracic;  Laterality: Left;  . CHEST TUBE INSERTION Left 04/08/2016   Procedure: CHEST TUBE INSERTION;  Surgeon: [name], MD;  Location: [facility];  Service: Thoracic;  Laterality: Left;  . COLONOSCOPY    . CORONARY ANGIOPLASTY    . CYSTOURETHROSCOPY     ROBOTIC ARM NUCLETRON SEED IMPLANTATION OF PROSTATE  . EXPLORATORY LAPAROTOMY     For evaluation of lymphoma  . REMOVAL OF PLEURAL DRAINAGE CATHETER Left 04/08/2016   Procedure: REMOVAL OF PLEURAL DRAINAGE CATHETER;  Surgeon: [name], MD;  Location[facility] OR;  Service: Thoracic;  Laterality: Left;   Current Outpatient Prescriptions on File Prior to Visit  Medication Sig Dispense Refill  . acetaminophen (TYLENOL) 500 MG tablet Take 1,000 mg by mouth every 6 (six) hours as needed for mild pain, moderate pain or headache.     . clopidogrel (PLAVIX) 75 MG tablet Take 75 mg by mouth daily.    . Cyanocobalamin (B-12 COMPLIANCE INJECTION IJ) Inject as directed every 30 (thirty) days. Last injection on dec 22nd 2017    . Dextromethorphan Polistirex (DELSYM PO) Take 15 mLs by mouth at bedtime.    Marland Kitchen eltrombopag (PROMACTA) 75 MG tablet Take 1 tablet (75 mg total) by  mouth daily. Take on an empty stomach 1 hour before a meal or 2 hours after (Patient taking differently: Take 75 mg by mouth daily before lunch. Take on an empty stomach 1 hour before a meal or 2 hours after) 30 tablet 9  . furosemide (LASIX) 40 MG tablet Take 40 mg by mouth daily.     Marland Kitchen glucose blood test strip 1 each by Other route daily. Fasting each morning 100 each 3  . hydrocortisone cream 1 % Apply 1 application topically at bedtime as needed for itching.    . Insulin Glargine (LANTUS SOLOSTAR) 100 UNIT/ML Solostar Pen INJECT 25 UNITS EVERY NIGHT AT BEDTIME 5 pen 3  . meclizine (ANTIVERT) 12.5 MG tablet Take 1 tablet (12.5 mg total) by mouth 3 (three) times daily as needed for dizziness. 30 tablet 0  . metoprolol  succinate (TOPROL-XL) 100 MG 24 hr tablet TAKE 1 TABLET BY MOUTH ONCE A DAY 90 tablet 3  . Multiple Vitamin (MULTIVITAMIN WITH MINERALS) TABS tablet Take 1 tablet by mouth at bedtime.    Marland Kitchen OVER THE COUNTER MEDICATION Place 1 drop into both eyes daily as needed (dry eyes). Over the counter lubricating eye drop     No current facility-administered medications on file prior to visit.    Allergies  Allergen Reactions  . Glucophage [Metformin Hydrochloride] Other (See Comments)    Chest pain  . Zetia [Ezetimibe] Other (See Comments)    weakness  . Fenofibrate Rash  . Niacin-Lovastatin Er Rash   Social History   Social History  . Marital status: Married    Spouse name: N/A  . Number of children: 4  . Years of education: N/A   Occupational History  . retired    Social History Main Topics  . Smoking status: Former Smoker    Packs/day: 0.50    Years: 20.00    Types: Pipe, Cigarettes    Quit date: 06/21/1958  . Smokeless tobacco: Never Used  . Alcohol use No  . Drug use: No  . Sexual activity: Yes    Partners: Female   Other Topics Concern  . Not on file   Social History Narrative  . No narrative on file      Review of Systems  All other systems reviewed and  are negative.      Objective:   Physical Exam  Constitutional: He appears well-developed and well-nourished. No distress.  HENT:  Right Ear: External ear normal.  Left Ear: External ear normal.  Nose: Nose normal.  Mouth/Throat: Oropharynx is clear and moist. No oropharyngeal exudate.  Eyes: Conjunctivae are normal.  Neck: Neck supple. No JVD present.  Cardiovascular: Normal rate and normal heart sounds.   Pulmonary/Chest: Effort normal. He has no wheezes. He has rales.  Abdominal: Soft. Bowel sounds are normal.  Musculoskeletal: He exhibits edema.  Lymphadenopathy:    He has no cervical adenopathy.  Skin: He is not diaphoretic.  Vitals reviewed.         Assessment & Plan:  Orthopnea - Plan: BASIC METABOLIC PANEL WITH GFR  Acute systolic congestive heart failure ([place])  I believe the patient needs increased diuresis. Increase Lasix to 40 mg by mouth twice a day. Add K-Dur 20 milliequivalents by mouth daily for potassium repletion. Begin weighing the patient every day to monitor his weight. Recheck here first of next week to monitor his weight. Once we have achieved approximately 182 pounds, if the patient's symptoms have improved, we will decrease his dose of Lasix to 40 mg a day as a maintenance dose. Monitor renal function closely

## 2016-06-25 LAB — BASIC METABOLIC PANEL WITH GFR
BUN: 22 mg/dL (ref 7–25)
CHLORIDE: 103 mmol/L (ref 98–110)
CO2: 24 mmol/L (ref 20–31)
Calcium: 8.3 mg/dL — ABNORMAL LOW (ref 8.6–10.3)
Creat: 1.54 mg/dL — ABNORMAL HIGH (ref 0.70–1.11)
GFR, Est African American: 48 mL/min — ABNORMAL LOW (ref >=60)
GFR, Est Non African American: 41 mL/min — ABNORMAL LOW (ref >=60)
Glucose, Bld: 235 mg/dL — ABNORMAL HIGH (ref 70–99)
POTASSIUM: 4 mmol/L (ref 3.5–5.3)
Sodium: 139 mmol/L (ref 135–146)

## 2016-06-29 ENCOUNTER — Encounter: Payer: Self-pay | Admitting: Family Medicine

## 2016-06-29 ENCOUNTER — Ambulatory Visit (INDEPENDENT_AMBULATORY_CARE_PROVIDER_SITE_OTHER): Payer: Medicare Other | Admitting: Family Medicine

## 2016-06-29 VITALS — BP 122/68 | HR 80 | Temp 97.7°F | Resp 18 | Ht 69.0 in | Wt 182.0 lb

## 2016-06-29 DIAGNOSIS — I5021 Acute systolic (congestive) heart failure: Secondary | ICD-10-CM

## 2016-06-29 MED ORDER — "SYRINGE/NEEDLE (DISP) 25G X 1"" 3 ML MISC": 3 refills | Status: DC

## 2016-06-29 MED ORDER — CYANOCOBALAMIN 1000 MCG/ML IJ SOLN: 1000.0000 ug | INTRAMUSCULAR | 3 refills | Status: DC

## 2016-06-29 NOTE — Progress Notes (Signed)
Subjective:    Patient ID: Ricardo Watkins, male    DOB: 09-Aug-1930, 83 y.o.   MRN: [id number]  HPI Patient saw Dr. Lucianne Lei Tright 06/02/16.  I have copied part of his history of present illness to provide a brief synopsis of his recent medical history with lung empyema:  "The patient previously had a left Pleurx catheter for recurrent pleural effusion secondary to diastolic heart failure, chronic kidney disease, and previous history of lymphoma. The patient developed a left lower lobe infection with empyema and the Pleurx catheter was removed and a interventional radiology pigtail catheter was placed. After prolonged antibiotics and drainage with the pigtail catheter the infection resolved and the catheter was removed. Since that time the pleural fluid has not recurred. The patient has been doing well at home. He is walking with a cane, his weight is stable and he has no chest discomfort. He wears oxygen at night."  06/16/16  Presents complaining of feeling "sick."  Patient states that over the last 3 days he's had increasing shortness of breath. He reports orthopnea. He is having to sleep sitting upright in a chair to help himself breathe. His weight is up 6 pounds from his last office visit. He has +2 pitting edema in both legs distal to his knees. He denies any fevers or chills but he does report a cough that is worse when he is supine. The cough is nonproductive. EKG today shows normal sinus rhythm with occasional PVCs and nonspecific ST changes in the lateral leads 1 and aVL. This is essentially unchanged from his EKG in October. Unfortunately now the patient is also reporting occasional chest tightness. This is substernal in location. He states it feels like a pressure in his chest. It comes and goes without provocation. It can last minutes to hours. At the present time he states that he is pain-free but this comes and goes frequently over the last 2 days.  At that time, my plan was: Patient's  shortness of breath and orthopnea are concerning for congestive heart failure. On his exam today he does appear to be fluid overloaded. I see no evidence of pneumonia or recurrent left pleural effusion on his exam. His chest tightness is concerning for cardiac ischemia or possibly demand ischemia secondary to heart failure. Therefore I believe the patient needs ER evaluation for cardiac enzymes, chest x-ray, diuresis. Also given his history of cancer, pulmonary embolism is on the differential diagnosis. At the present time he is clinically stable. This is been going on for 3 days. Therefore I do not believe that the patient needs ambulance transport. He is comfortable going to the hospital with his daughter driving him.  06/24/16 Patient has lost 2 pounds since his last visit. He was seen at the emergency room where his BNP was over 400. His chest x-ray also showed small bilateral pleural effusions. Evaluation suggested pulmonary edema. His Lasix was increased from 20 mg a day to 40 mg a day. He has been taking 40 mg a day since that time. He reports that he feels some better but he is still having orthopnea and having to sleep in a recliner to help him breathe. He still has pitting edema on his exam. He also still has bibasilar crackles right greater than left. I believe his dry weight is near 182 pounds.  At that time, my plan was: I believe the patient needs increased diuresis. Increase Lasix to 40 mg by mouth twice a day. Add K-Dur 20 milliequivalents  by mouth daily for potassium repletion. Begin weighing the patient every day to monitor his weight. Recheck here first of next week to monitor his weight. Once we have achieved approximately 182 pounds, if the patient's symptoms have improved, we will decrease his dose of Lasix to 40 mg a day as a maintenance dose. Monitor renal function closely  06/29/16 Wt Readings from Last 3 Encounters:  06/29/16 182 lb (82.6 kg)  06/24/16 187 lb (84.8 kg)  06/16/16 186  lb 14.4 oz (84.8 kg)  Patient's breathing is doing much better. The edema has resolved in his legs. On exam today there are no crackles Rales in his lung. His orthopnea has improved. He has reached his dry weight 182 pounds. His recent lab work showed that his potassium was 4.0 on Lasix 40 mg a day even without potassium repletion  Past Medical History:  Diagnosis Date  . Adenomatous colon polyp   . CAD (coronary artery disease)    30% LAD Stenosis, 70% ramus intermedius stenosis, treated with PTCA and angioplasty by Dr Albertine Patricia 2004  . Cataract    right eye  . Cough, persistent 11/04/2015  . Deficiency anemia 04/19/2014  . Diverticulosis   . DM (diabetes mellitus) ([place])   . DVT (deep venous thrombosis) ([place])    secondary to surgery  . Dyslipidemia   . Hyperlipidemia   . Hypertension   . Inguinal hernia    right  . 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 Microcytic anemia 07/07/2015  . 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 Non Hodgkin's lymphoma ([place])   . Pleural effusion, left 11/04/2015  . 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 PVD (peripheral vascular disease) ([place])    rt renal artery stent  . Renal insufficiency   . Thrombocytopenia ([place])   . Thrombotic stroke ([place]) 09/10/2011   January 18, 2011 infarct genu & post limb R internal capsule - acute; previous lacunar infarcts/extensive white matter dis  . Vitamin D deficiency    Past Surgical History:  Procedure Laterality Date  . APPENDECTOMY     patient ?  Marland Kitchen CARDIAC CATHETERIZATION    . CHEST TUBE INSERTION Left 02/10/2016   Procedure: INSERTION PLEURAL DRAINAGE CATHETER;  Surgeon: [name], MD;  Location: [facility];  Service: Thoracic;  Laterality: Left;  . CHEST TUBE INSERTION Left 04/08/2016   Procedure: CHEST TUBE INSERTION;  Surgeon: [name], MD;  Location: [facility];  Service: Thoracic;  Laterality: Left;  .  COLONOSCOPY    . CORONARY ANGIOPLASTY    . CYSTOURETHROSCOPY     ROBOTIC ARM NUCLETRON SEED IMPLANTATION OF PROSTATE  . EXPLORATORY LAPAROTOMY     For evaluation of lymphoma  . REMOVAL OF PLEURAL DRAINAGE CATHETER Left 04/08/2016   Procedure: REMOVAL OF PLEURAL DRAINAGE CATHETER;  Surgeon: [name], MD;  Location: [facility];  Service: Thoracic;  Laterality: Left;   Current Outpatient Prescriptions on File Prior to Visit  Medication Sig Dispense Refill  . acetaminophen (TYLENOL) 500 MG tablet Take 1,000 mg by mouth every 6 (six) hours as needed for mild pain, moderate pain or headache.     . clopidogrel (PLAVIX) 75 MG tablet Take 75 mg by mouth daily.    . Cyanocobalamin (B-12 COMPLIANCE INJECTION IJ) Inject as directed every 30 (thirty) days. Last injection on dec 22nd 2017    .  Dextromethorphan Polistirex (DELSYM PO) Take 15 mLs by mouth at bedtime.    Marland Kitchen eltrombopag (PROMACTA) 75 MG tablet Take 1 tablet (75 mg total) by mouth daily. Take on an empty stomach 1 hour before a meal or 2 hours after (Patient taking differently: Take 75 mg by mouth daily before lunch. Take on an empty stomach 1 hour before a meal or 2 hours after) 30 tablet 9  . furosemide (LASIX) 40 MG tablet Take 40 mg by mouth daily.     Marland Kitchen glucose blood test strip 1 each by Other route daily. Fasting each morning 100 each 3  . hydrocortisone cream 1 % Apply 1 application topically at bedtime as needed for itching.    . Insulin Glargine (LANTUS SOLOSTAR) 100 UNIT/ML Solostar Pen INJECT 25 UNITS EVERY NIGHT AT BEDTIME 5 pen 3  . meclizine (ANTIVERT) 12.5 MG tablet Take 1 tablet (12.5 mg total) by mouth 3 (three) times daily as needed for dizziness. 30 tablet 0  . metoprolol succinate (TOPROL-XL) 100 MG 24 hr tablet TAKE 1 TABLET BY MOUTH ONCE A DAY 90 tablet 3  . Multiple Vitamin (MULTIVITAMIN WITH MINERALS) TABS tablet Take 1 tablet by mouth at bedtime.    Marland Kitchen OVER THE COUNTER MEDICATION Place 1 drop into both eyes daily as  needed (dry eyes). Over the counter lubricating eye drop     No current facility-administered medications on file prior to visit.    Allergies  Allergen Reactions  . Glucophage [Metformin Hydrochloride] Other (See Comments)    Chest pain  . Zetia [Ezetimibe] Other (See Comments)    weakness  . Fenofibrate Rash  . Niacin-Lovastatin Er Rash   Social History   Social History  . Marital status: Married    Spouse name: N/A  . Number of children: 4  . Years of education: N/A   Occupational History  . retired    Social History Main Topics  . Smoking status: Former Smoker    Packs/day: 0.50    Years: 20.00    Types: Pipe, Cigarettes    Quit date: 06/21/1958  . Smokeless tobacco: Never Used  . Alcohol use No  . Drug use: No  . Sexual activity: Yes    Partners: Female   Other Topics Concern  . Not on file   Social History Narrative  . No narrative on file      Review of Systems  All other systems reviewed and are negative.      Objective:   Physical Exam  Constitutional: He appears well-developed and well-nourished. No distress.  HENT:  Right Ear: External ear normal.  Left Ear: External ear normal.  Nose: Nose normal.  Mouth/Throat: Oropharynx is clear and moist. No oropharyngeal exudate.  Eyes: Conjunctivae are normal.  Neck: Neck supple. No JVD present.  Cardiovascular: Normal rate and normal heart sounds.   Pulmonary/Chest: Effort normal. He has no wheezes. He has no rales.  Abdominal: Soft. Bowel sounds are normal.  Musculoskeletal: He exhibits no edema.  Lymphadenopathy:    He has no cervical adenopathy.  Skin: He is not diaphoretic.  Vitals reviewed.         Assessment & Plan:  Acute systolic congestive heart failure (HCC)  Patient's pulmonary edema and fluid overloaded status has seemingly resolved. Decrease Lasix to 40 mg a day as a maintenance dose. Discontinue potassium repletion. Have the patient weighed himself on a daily basis. According  to his home scales he weighs 179 pounds today. If he gains or  loses more than 2 pounds in a day he will notify us so we can change the dose of his diuretic.

## 2016-07-01 ENCOUNTER — Telehealth: Payer: Self-pay | Admitting: *Deleted

## 2016-07-01 ENCOUNTER — Ambulatory Visit (HOSPITAL_BASED_OUTPATIENT_CLINIC_OR_DEPARTMENT_OTHER): Payer: Medicare Other | Admitting: Hematology and Oncology

## 2016-07-01 ENCOUNTER — Encounter: Payer: Self-pay | Admitting: Hematology and Oncology

## 2016-07-01 ENCOUNTER — Telehealth: Payer: Self-pay | Admitting: Hematology and Oncology

## 2016-07-01 ENCOUNTER — Ambulatory Visit (HOSPITAL_BASED_OUTPATIENT_CLINIC_OR_DEPARTMENT_OTHER): Payer: Medicare Other

## 2016-07-01 ENCOUNTER — Other Ambulatory Visit (HOSPITAL_BASED_OUTPATIENT_CLINIC_OR_DEPARTMENT_OTHER): Payer: Medicare Other

## 2016-07-01 VITALS — BP 125/57 | HR 84 | Temp 97.8°F | Resp 17 | Ht 69.0 in | Wt 184.8 lb

## 2016-07-01 DIAGNOSIS — D631 Anemia in chronic kidney disease: Secondary | ICD-10-CM

## 2016-07-01 DIAGNOSIS — D539 Nutritional anemia, unspecified: Secondary | ICD-10-CM | POA: Diagnosis not present

## 2016-07-01 DIAGNOSIS — N183 Chronic kidney disease, stage 3 unspecified: Secondary | ICD-10-CM

## 2016-07-01 DIAGNOSIS — D518 Other vitamin B12 deficiency anemias: Secondary | ICD-10-CM | POA: Diagnosis not present

## 2016-07-01 DIAGNOSIS — E538 Deficiency of other specified B group vitamins: Secondary | ICD-10-CM

## 2016-07-01 DIAGNOSIS — D46C Myelodysplastic syndrome with isolated del(5q) chromosomal abnormality: Secondary | ICD-10-CM

## 2016-07-01 DIAGNOSIS — N189 Chronic kidney disease, unspecified: Secondary | ICD-10-CM

## 2016-07-01 DIAGNOSIS — J90 Pleural effusion, not elsewhere classified: Secondary | ICD-10-CM | POA: Diagnosis not present

## 2016-07-01 DIAGNOSIS — D693 Immune thrombocytopenic purpura: Secondary | ICD-10-CM

## 2016-07-01 DIAGNOSIS — D696 Thrombocytopenia, unspecified: Secondary | ICD-10-CM

## 2016-07-01 LAB — CBC WITH DIFFERENTIAL/PLATELET
BASO%: 0.3 % (ref 0.0–2.0)
BASOS ABS: 0 10*3/uL (ref 0.0–0.1)
EOS ABS: 0 10*3/uL (ref 0.0–0.5)
EOS%: 0.5 % (ref 0.0–7.0)
HCT: 28 % — ABNORMAL LOW (ref 38.4–49.9)
HEMOGLOBIN: 8.4 g/dL — AB (ref 13.0–17.1)
LYMPH#: 1.7 10*3/uL (ref 0.9–3.3)
LYMPH%: 43.4 % (ref 14.0–49.0)
MCH: 24.6 pg — AB (ref 27.2–33.4)
MCHC: 30 g/dL — ABNORMAL LOW (ref 32.0–36.0)
MCV: 82.1 fL (ref 79.3–98.0)
MONO#: 0.5 10*3/uL (ref 0.1–0.9)
MONO%: 12.7 % (ref 0.0–14.0)
NEUT#: 1.7 10*3/uL (ref 1.5–6.5)
NEUT%: 43.1 % (ref 39.0–75.0)
NRBC: 1 % — AB (ref 0–0)
RBC: 3.41 10*6/uL — ABNORMAL LOW (ref 4.20–5.82)
RDW: 24.8 % — ABNORMAL HIGH (ref 11.0–14.6)
WBC: 3.9 10*3/uL — ABNORMAL LOW (ref 4.0–10.3)

## 2016-07-01 LAB — TECHNOLOGIST REVIEW

## 2016-07-01 LAB — IRON AND TIBC
%SAT: 59 % — ABNORMAL HIGH (ref 20–55)
Iron: 136 ug/dL (ref 42–163)
TIBC: 233 ug/dL (ref 202–409)
UIBC: 96 ug/dL — AB (ref 117–376)

## 2016-07-01 LAB — FERRITIN: Ferritin: 345 ng/ml — ABNORMAL HIGH (ref 22–316)

## 2016-07-01 NOTE — Assessment & Plan Note (Signed)
The patient had drastic change in his CBC with significant acute worsening pancytopenia likely due to increased consumption from minor bleeding and stress reaction The patient is becoming symptomatic from anemia He also have concurrent chronic renal failure as a cause of anemia We discussed some of the risks, benefits, and alternatives of erythropoietin stimulating agents such as Procrit or Aranesp. The patient is symptomatic from anemia and the EPO level is low. Some of the side-effects to be expected including risks of allergic reactions, skin rashes, headaches, risk of blood clots including heart attack and stroke. There is rare risks of causing growth of cancers.The patient is willing to proceed and went ahead to sign consent today.  I plan to bring him back every 2 weeks to get darbepoetin to keep hemoglobin greater than 10

## 2016-07-01 NOTE — Assessment & Plan Note (Signed)
His platelet count is severely reduced He has no responsive Promacta He is refractory to steroids I recommend switching his treatment to Nplate The risks, benefits, satisfactory of Nplate is discussed and he agreed to proceed Goal is to keep platelet count greater than 50,000. He will return here on a weekly basis for this

## 2016-07-01 NOTE — Telephone Encounter (Signed)
Called [facility] pharmacy to notify that Ricardo Watkins has been discontinued.  Spoke with [employer].

## 2016-07-01 NOTE — Assessment & Plan Note (Signed)
The cause of pleural effusion is unknown. PET/CT scan did not show definitive signs of lymphoma He had recent pleurodesis He is not too symptomatic with shortness of breath Continue close observation

## 2016-07-01 NOTE — Assessment & Plan Note (Signed)
He has been receiving chronic injection through his primary care physician's office I will be willing to administer it to him here

## 2016-07-01 NOTE — Assessment & Plan Note (Signed)
he will continue current medical management. I recommend close follow-up with primary care doctor for medication adjustment.  

## 2016-07-01 NOTE — Telephone Encounter (Signed)
Gave patient relative avs report and appointments for January thru March

## 2016-07-01 NOTE — Progress Notes (Signed)
[facility] OFFICE PROGRESS NOTE  Patient Care Team: [name], MD as PCP - General (Family Medicine) Heath Lark, MD as Consulting Physician (Hematology and Oncology) [name], MD as Consulting Physician (Cardiology)  SUMMARY OF ONCOLOGIC HISTORY: Oncology History   Calculated R-IPSS score at low risk     MDS (myelodysplastic syndrome) with 5q deletion ([place])   11/07/2014 Bone Marrow Biopsy    Accession: [id number] marrow biopsy showed myelodysplastic syndrome with 5q deletion.      04/02/2015 - 07/01/2016 Chemotherapy    He started taking Promacta daily for thrombocytopenia, stopped due to lack of response       INTERVAL HISTORY: Please see below for problem oriented charting. He returns with family members He had recurrent hospitalization and pleurodesis for pleural effusion and CHF He continues to have occasional cough Here shortness of breath on moderate exertion The patient denies any recent signs or symptoms of bleeding such as spontaneous epistaxis, hematuria or hematochezia. He denies recent chest pain, dizziness or palpitation  REVIEW OF SYSTEMS:   Constitutional: Denies fevers, chills or abnormal weight loss Eyes: Denies blurriness of vision Ears, nose, mouth, throat, and face: Denies mucositis or sore throat Cardiovascular: Denies palpitation, chest discomfort or lower extremity swelling Gastrointestinal:  Denies nausea, heartburn or change in bowel habits Skin: Denies abnormal skin rashes Lymphatics: Denies new lymphadenopathy or easy bruising Neurological:Denies numbness, tingling or new weaknesses Behavioral/Psych: Mood is stable, no new changes  All other systems were reviewed with the patient and are negative.  I have reviewed the past medical history, past surgical history, social history and family history with the patient and they are unchanged from previous note.  ALLERGIES:  is allergic to glucophage [metformin  hydrochloride]; zetia [ezetimibe]; fenofibrate; and niacin-lovastatin er.  MEDICATIONS:  Current Outpatient Prescriptions  Medication Sig Dispense Refill  . acetaminophen (TYLENOL) 500 MG tablet Take 1,000 mg by mouth every 6 (six) hours as needed for mild pain, moderate pain or headache.     . clopidogrel (PLAVIX) 75 MG tablet Take 75 mg by mouth daily.    . cyanocobalamin (,VITAMIN B-12,) 1000 MCG/ML injection Inject 1 mL (1,000 mcg total) into the muscle every 30 (thirty) days. 10 mL 3  . Cyanocobalamin (B-12 COMPLIANCE INJECTION IJ) Inject as directed every 30 (thirty) days. Last injection on dec 22nd 2017    . Dextromethorphan Polistirex (DELSYM PO) Take 15 mLs by mouth at bedtime.    . furosemide (LASIX) 40 MG tablet Take 40 mg by mouth daily.     Marland Kitchen glucose blood test strip 1 each by Other route daily. Fasting each morning 100 each 3  . hydrocortisone cream 1 % Apply 1 application topically at bedtime as needed for itching.    . Insulin Glargine (LANTUS SOLOSTAR) 100 UNIT/ML Solostar Pen INJECT 25 UNITS EVERY NIGHT AT BEDTIME 5 pen 3  . meclizine (ANTIVERT) 12.5 MG tablet Take 1 tablet (12.5 mg total) by mouth 3 (three) times daily as needed for dizziness. 30 tablet 0  . metoprolol succinate (TOPROL-XL) 100 MG 24 hr tablet TAKE 1 TABLET BY MOUTH ONCE A DAY 90 tablet 3  . Multiple Vitamin (MULTIVITAMIN WITH MINERALS) TABS tablet Take 1 tablet by mouth at bedtime.    Marland Kitchen OVER THE COUNTER MEDICATION Place 1 drop into both eyes daily as needed (dry eyes). Over the counter lubricating eye drop    . SYRINGE-NEEDLE, DISP, 3 ML (LUER LOCK SAFETY SYRINGES) 25G X 1" 3 ML MISC Use with B  12 injections q month 100 each 3   No current facility-administered medications for this visit.     PHYSICAL EXAMINATION: ECOG PERFORMANCE STATUS: 2 - Symptomatic, <50% confined to bed  Vitals:   07/01/16 1335  BP: (!) 125/57  Pulse: 84  Resp: 17  Temp: 97.8 F (36.6 C)   Filed Weights   07/01/16 1335   Weight: 184 lb 12.8 oz (83.8 kg)    GENERAL:alert, no distress and comfortable. He looks elderly and pale SKIN: skin color is pale, texture, turgor are normal, no rashes or significant lesions EYES: normal, Conjunctiva are pink and non-injected, sclera clear OROPHARYNX:no exudate, no erythema and lips, buccal mucosa, and tongue normal  NECK: supple, thyroid normal size, non-tender, without nodularity LYMPH:  no palpable lymphadenopathy in the cervical, axillary or inguinal LUNGS: Mild increased breathing effort. Reduced breath sounds in the left lower base HEART: regular rate & rhythm and no murmurs with moderate bilateral lower extremity edema ABDOMEN:abdomen soft, non-tender and normal bowel sounds Musculoskeletal:no cyanosis of digits and no clubbing  NEURO: alert & oriented x 3 with fluent speech, no focal motor/sensory deficits  LABORATORY DATA:  I have reviewed the data as listed    Component Value Date/Time   NA 139 06/24/2016 1439   NA 138 03/02/2016 1238   K 4.0 06/24/2016 1439   K 4.6 03/02/2016 1238   CL 103 06/24/2016 1439   CL 107 09/27/2012 0940   CO2 24 06/24/2016 1439   CO2 25 03/02/2016 1238   GLUCOSE 235 (H) 06/24/2016 1439   GLUCOSE 163 (H) 03/02/2016 1238   GLUCOSE 89 09/27/2012 0940   BUN 22 06/24/2016 1439   BUN 20.9 03/02/2016 1238   CREATININE 1.54 (H) 06/24/2016 1439   CREATININE 1.8 (H) 03/02/2016 1238   CALCIUM 8.3 (L) 06/24/2016 1439   CALCIUM 8.4 03/02/2016 1238   PROT 6.4 (L) 04/10/2016 0430   PROT 8.2 03/02/2016 1238   ALBUMIN 1.6 (L) 04/10/2016 0430   ALBUMIN 2.6 (L) 03/02/2016 1238   AST 14 (L) 04/10/2016 0430   AST 21 03/02/2016 1238   ALT 13 (L) 04/10/2016 0430   ALT 29 03/02/2016 1238   ALKPHOS 59 04/10/2016 0430   ALKPHOS 84 03/02/2016 1238   BILITOT 0.4 04/10/2016 0430   BILITOT <0.30 03/02/2016 1238   GFRNONAA 41 (L) 06/24/2016 1439   GFRAA 48 (L) 06/24/2016 1439    No results found for: SPEP, UPEP  Lab Results  Component  Value Date   WBC 3.9 (L) 07/01/2016   NEUTROABS 1.7 07/01/2016   HGB 8.4 (L) 07/01/2016   HCT 28.0 (L) 07/01/2016   MCV 82.1 07/01/2016   PLT 43 Large & giant platelets (L) 07/01/2016      Chemistry      Component Value Date/Time   NA 139 06/24/2016 1439   NA 138 03/02/2016 1238   K 4.0 06/24/2016 1439   K 4.6 03/02/2016 1238   CL 103 06/24/2016 1439   CL 107 09/27/2012 0940   CO2 24 06/24/2016 1439   CO2 25 03/02/2016 1238   BUN 22 06/24/2016 1439   BUN 20.9 03/02/2016 1238   CREATININE 1.54 (H) 06/24/2016 1439   CREATININE 1.8 (H) 03/02/2016 1238      Component Value Date/Time   CALCIUM 8.3 (L) 06/24/2016 1439   CALCIUM 8.4 03/02/2016 1238   ALKPHOS 59 04/10/2016 0430   ALKPHOS 84 03/02/2016 1238   AST 14 (L) 04/10/2016 0430   AST 21 03/02/2016 1238   ALT  13 (L) 04/10/2016 0430   ALT 29 03/02/2016 1238   BILITOT 0.4 04/10/2016 0430   BILITOT <0.30 03/02/2016 1238       RADIOGRAPHIC STUDIES: I have personally reviewed the radiological images as listed and agreed with the findings in the report. Dg Chest 2 View  Result Date: 06/16/2016 CLINICAL DATA:  Mid chest pain. EXAM: CHEST  2 VIEW COMPARISON:  June 02, 2016 FINDINGS: The small left pleural effusion and underlying opacity are similar in the interval. There is probably a tiny right effusion as well with atelectasis. No pneumothorax. No other interval changes. IMPRESSION: Small bilateral effusions, left greater than right, with underlying atelectasis. The findings are similar in the interval. Electronically Signed   By: Dorise Bullion III M.D   On: 06/16/2016 12:10   Dg Chest 2 View  Result Date: 06/02/2016 CLINICAL DATA:  Live fusion, status post pleural drainage catheter removal on October 19. No current chest complaints. History of non-Hodgkin's lymphoma, coronary artery disease, CHF, remote history of smoking. EXAM: CHEST  2 VIEW COMPARISON:  PA and lateral chest x-ray of May 05, 2016 FINDINGS: There  remains a small amount of pleural fluid layering at the left lung base and along the left lower thoracic wall. The volume has decreased slightly since the previous study. A small amount of parenchymal density persists in the left lower lobe. There is no significant right-sided pleural effusion. The heart and pulmonary vascularity are normal. There is calcification in the wall of the aortic arch. The bony thorax exhibits no acute abnormality. IMPRESSION: Slight interval decreased in these size of the small left pleural effusion. Residual subsegmental atelectasis or scarring in the left lower lobe. Thoracic aortic atherosclerosis. Electronically Signed   By: David  Martinique M.D.   On: 06/02/2016 13:29    ASSESSMENT & PLAN:  MDS (myelodysplastic syndrome) with 5q deletion The patient had drastic change in his CBC with significant acute worsening pancytopenia likely due to increased consumption from minor bleeding and stress reaction The patient is becoming symptomatic from anemia He also have concurrent chronic renal failure as a cause of anemia We discussed some of the risks, benefits, and alternatives of erythropoietin stimulating agents such as Procrit or Aranesp. The patient is symptomatic from anemia and the EPO level is low. Some of the side-effects to be expected including risks of allergic reactions, skin rashes, headaches, risk of blood clots including heart attack and stroke. There is rare risks of causing growth of cancers.The patient is willing to proceed and went ahead to sign consent today.  I plan to bring him back every 2 weeks to get darbepoetin to keep hemoglobin greater than 10  Thrombocytopenia (HCC) His platelet count is severely reduced He has no responsive Promacta He is refractory to steroids I recommend switching his treatment to Nplate The risks, benefits, satisfactory of Nplate is discussed and he agreed to proceed Goal is to keep platelet count greater than 50,000. He will  return here on a weekly basis for this  Vitamin B12 deficiency He has been receiving chronic injection through his primary care physician's office I will be willing to administer it to him here   Pleural effusion The cause of pleural effusion is unknown. PET/CT scan did not show definitive signs of lymphoma He had recent pleurodesis He is not too symptomatic with shortness of breath Continue close observation  Chronic renal failure, stage 3 (moderate) he will continue current medical management. I recommend close follow-up with primary care doctor for medication  adjustment.    Orders Placed This Encounter  Procedures  . Ferritin    Standing Status:   Future    Number of Occurrences:   1    Standing Expiration Date:   08/05/2017  . Iron and TIBC    Standing Status:   Future    Number of Occurrences:   1    Standing Expiration Date:   08/05/2017  . Erythropoietin    Standing Status:   Future    Number of Occurrences:   1    Standing Expiration Date:   08/05/2017  . Vitamin B12    Standing Status:   Future    Number of Occurrences:   1    Standing Expiration Date:   08/05/2017   All questions were answered. The patient knows to call the clinic with any problems, questions or concerns. No barriers to learning was detected. I spent 25 minutes counseling the patient face to face. The total time spent in the appointment was 40 minutes and more than 50% was on counseling and review of test results     Heath Lark, MD 07/01/2016 5:36 PM

## 2016-07-02 ENCOUNTER — Ambulatory Visit: Payer: Medicare Other | Admitting: Family Medicine

## 2016-07-02 LAB — ERYTHROPOIETIN: ERYTHROPOIETIN: 24.9 m[IU]/mL — AB (ref 2.6–18.5)

## 2016-07-02 LAB — VITAMIN B12: Vitamin B12: 727 pg/mL (ref 232–1245)

## 2016-07-06 ENCOUNTER — Ambulatory Visit (INDEPENDENT_AMBULATORY_CARE_PROVIDER_SITE_OTHER): Payer: Medicare Other

## 2016-07-06 DIAGNOSIS — E538 Deficiency of other specified B group vitamins: Secondary | ICD-10-CM | POA: Diagnosis not present

## 2016-07-06 MED ORDER — CYANOCOBALAMIN 1000 MCG/ML IJ SOLN
1000.0000 ug | INTRAMUSCULAR | Status: DC
  Administered 2016-07-06 – 2018-06-20 (×8): 1000 ug via SUBCUTANEOUS

## 2016-07-08 ENCOUNTER — Ambulatory Visit: Payer: Medicare Other

## 2016-07-08 ENCOUNTER — Telehealth: Payer: Self-pay | Admitting: Hematology and Oncology

## 2016-07-08 ENCOUNTER — Other Ambulatory Visit: Payer: Medicare Other

## 2016-07-08 NOTE — Telephone Encounter (Signed)
Appointments canceled per patient request.

## 2016-07-12 ENCOUNTER — Other Ambulatory Visit: Payer: Self-pay | Admitting: Family Medicine

## 2016-07-14 ENCOUNTER — Other Ambulatory Visit: Payer: Self-pay | Admitting: Family Medicine

## 2016-07-14 NOTE — Telephone Encounter (Signed)
Medication refilled per protocol. 

## 2016-07-15 ENCOUNTER — Ambulatory Visit (HOSPITAL_BASED_OUTPATIENT_CLINIC_OR_DEPARTMENT_OTHER): Payer: Medicare Other

## 2016-07-15 ENCOUNTER — Other Ambulatory Visit (HOSPITAL_BASED_OUTPATIENT_CLINIC_OR_DEPARTMENT_OTHER): Payer: Medicare Other

## 2016-07-15 VITALS — BP 144/65 | HR 76 | Temp 97.0°F | Resp 20

## 2016-07-15 DIAGNOSIS — D693 Immune thrombocytopenic purpura: Secondary | ICD-10-CM

## 2016-07-15 DIAGNOSIS — D631 Anemia in chronic kidney disease: Secondary | ICD-10-CM | POA: Diagnosis not present

## 2016-07-15 DIAGNOSIS — N183 Chronic kidney disease, stage 3 unspecified: Secondary | ICD-10-CM

## 2016-07-15 DIAGNOSIS — D539 Nutritional anemia, unspecified: Secondary | ICD-10-CM

## 2016-07-15 DIAGNOSIS — D46C Myelodysplastic syndrome with isolated del(5q) chromosomal abnormality: Secondary | ICD-10-CM

## 2016-07-15 DIAGNOSIS — D696 Thrombocytopenia, unspecified: Secondary | ICD-10-CM

## 2016-07-15 LAB — TECHNOLOGIST REVIEW

## 2016-07-15 LAB — CBC WITH DIFFERENTIAL/PLATELET
BASO%: 0.2 % (ref 0.0–2.0)
BASOS ABS: 0 10*3/uL (ref 0.0–0.1)
EOS ABS: 0 10*3/uL (ref 0.0–0.5)
EOS%: 0.2 % (ref 0.0–7.0)
HCT: 28.3 % — ABNORMAL LOW (ref 38.4–49.9)
HGB: 8.4 g/dL — ABNORMAL LOW (ref 13.0–17.1)
LYMPH%: 39.9 % (ref 14.0–49.0)
MCH: 24 pg — ABNORMAL LOW (ref 27.2–33.4)
MCHC: 29.7 g/dL — AB (ref 32.0–36.0)
MCV: 80.9 fL (ref 79.3–98.0)
MONO#: 0.6 10*3/uL (ref 0.1–0.9)
MONO%: 12.3 % (ref 0.0–14.0)
NEUT#: 2.1 10*3/uL (ref 1.5–6.5)
NEUT%: 47.4 % (ref 39.0–75.0)
NRBC: 1 % — AB (ref 0–0)
RBC: 3.5 10*6/uL — AB (ref 4.20–5.82)
RDW: 25.3 % — ABNORMAL HIGH (ref 11.0–14.6)
WBC: 4.5 10*3/uL (ref 4.0–10.3)
lymph#: 1.8 10*3/uL (ref 0.9–3.3)

## 2016-07-15 MED ORDER — ROMIPLOSTIM 250 MCG ~~LOC~~ SOLR
85.0000 ug | SUBCUTANEOUS | Status: DC
  Administered 2016-07-15: 85 ug via SUBCUTANEOUS
  Filled 2016-07-15: qty 0.17

## 2016-07-15 MED ORDER — DARBEPOETIN ALFA 200 MCG/0.4ML IJ SOSY
200.0000 ug | PREFILLED_SYRINGE | Freq: Once | INTRAMUSCULAR | Status: AC
  Administered 2016-07-15: 200 ug via SUBCUTANEOUS
  Filled 2016-07-15: qty 0.4

## 2016-07-15 NOTE — Progress Notes (Signed)
Verbal consent given by [name] to treat with N-Plate today with lab results of platelets of 75K.

## 2016-07-15 NOTE — Patient Instructions (Signed)
Romiplostim injection What is this medicine? ROMIPLOSTIM (roe mi PLOE stim) helps your body make more platelets. This medicine is used to treat low platelets caused by chronic idiopathic thrombocytopenic purpura (ITP). COMMON BRAND NAME(S): Nplate What should I tell my health care provider before I take this medicine? They need to know if you have any of these conditions: -cancer or myelodysplastic syndrome -low blood counts, like low white cell, platelet, or red cell counts -take medicines that treat or prevent blood clots -an unusual or allergic reaction to romiplostim, mannitol, other medicines, foods, dyes, or preservatives -pregnant or trying to get pregnant -breast-feeding How should I use this medicine? This medicine is for injection under the skin. It is given by a health care professional in a hospital or clinic setting. A special MedGuide will be given to you before your injection. Read this information carefully each time. Talk to your pediatrician regarding the use of this medicine in children. Special care may be needed. What if I miss a dose? It is important not to miss your dose. Call your doctor or health care professional if you are unable to keep an appointment. What may interact with this medicine? Interactions are not expected. What should I watch for while using this medicine? Your condition will be monitored carefully while you are receiving this medicine. Visit your prescriber or health care professional for regular checks on your progress and for the needed blood tests. It is important to keep all appointments. What side effects may I notice from receiving this medicine? Side effects that you should report to your doctor or health care professional as soon as possible: -allergic reactions like skin rash, itching or hives, swelling of the face, lips, or tongue -shortness of breath, chest pain, swelling in a leg -unusual bleeding or bruising Side effects that usually  do not require medical attention (report to your doctor or health care professional if they continue or are bothersome): -dizziness -headache -muscle aches -pain in arms and legs -stomach pain -trouble sleeping Where should I keep my medicine? This drug is given in a hospital or clinic and will not be stored at home.  2017 Elsevier/Gold Standard (2008-02-05 15:13:04) Darbepoetin Alfa injection What is this medicine? DARBEPOETIN ALFA (dar be POE e tin AL fa) helps your body make more red blood cells. It is used to treat anemia caused by chronic kidney failure and chemotherapy. This medicine may be used for other purposes; ask your health care provider or pharmacist if you have questions. COMMON BRAND NAME(S): Aranesp What should I tell my health care provider before I take this medicine? They need to know if you have any of these conditions: -blood clotting disorders or history of blood clots -cancer patient not on chemotherapy -cystic fibrosis -heart disease, such as angina, heart failure, or a history of a heart attack -hemoglobin level of 12 g/dL or greater -high blood pressure -low levels of folate, iron, or vitamin B12 -seizures -an unusual or allergic reaction to darbepoetin, erythropoietin, albumin, hamster proteins, latex, other medicines, foods, dyes, or preservatives -pregnant or trying to get pregnant -breast-feeding How should I use this medicine? This medicine is for injection into a vein or under the skin. It is usually given by a health care professional in a hospital or clinic setting. If you get this medicine at home, you will be taught how to prepare and give this medicine. Use exactly as directed. Take your medicine at regular intervals. Do not take your medicine more often than directed.  It is important that you put your used needles and syringes in a special sharps container. Do not put them in a trash can. If you do not have a sharps container, call your  pharmacist or healthcare provider to get one. A special MedGuide will be given to you by the pharmacist with each prescription and refill. Be sure to read this information carefully each time. Talk to your pediatrician regarding the use of this medicine in children. While this medicine may be used in children as young as 1 year for selected conditions, precautions do apply. Overdosage: If you think you have taken too much of this medicine contact a poison control center or emergency room at once. NOTE: This medicine is only for you. Do not share this medicine with others. What if I miss a dose? If you miss a dose, take it as soon as you can. If it is almost time for your next dose, take only that dose. Do not take double or extra doses. What may interact with this medicine? Do not take this medicine with any of the following medications: -epoetin alfa This list may not describe all possible interactions. Give your health care provider a list of all the medicines, herbs, non-prescription drugs, or dietary supplements you use. Also tell them if you smoke, drink alcohol, or use illegal drugs. Some items may interact with your medicine. What should I watch for while using this medicine? Your condition will be monitored carefully while you are receiving this medicine. You may need blood work done while you are taking this medicine. What side effects may I notice from receiving this medicine? Side effects that you should report to your doctor or health care professional as soon as possible: -allergic reactions like skin rash, itching or hives, swelling of the face, lips, or tongue -breathing problems -changes in vision -chest pain -confusion, trouble speaking or understanding -feeling faint or lightheaded, falls -high blood pressure -muscle aches or pains -pain, swelling, warmth in the leg -rapid weight gain -severe headaches -sudden numbness or weakness of the face, arm or leg -trouble  walking, dizziness, loss of balance or coordination -seizures (convulsions) -swelling of the ankles, feet, hands -unusually weak or tired Side effects that usually do not require medical attention (report to your doctor or health care professional if they continue or are bothersome): -diarrhea -fever, chills (flu-like symptoms) -headaches -nausea, vomiting -redness, stinging, or swelling at site where injected This list may not describe all possible side effects. Call your doctor for medical advice about side effects. You may report side effects to FDA at 1-800-FDA-1088. Where should I keep my medicine? Keep out of the reach of children. Store in a refrigerator between 2 and 8 degrees C (36 and 46 degrees F). Do not freeze. Do not shake. Throw away any unused portion if using a single-dose vial. Throw away any unused medicine after the expiration date. NOTE: This sheet is a summary. It may not cover all possible information. If you have questions about this medicine, talk to your doctor, pharmacist, or health care provider.  2017 Elsevier/Gold Standard (2016-01-26 19:52:26)

## 2016-07-19 ENCOUNTER — Other Ambulatory Visit: Payer: Self-pay | Admitting: Hematology and Oncology

## 2016-07-19 DIAGNOSIS — E538 Deficiency of other specified B group vitamins: Secondary | ICD-10-CM

## 2016-07-22 ENCOUNTER — Other Ambulatory Visit (HOSPITAL_BASED_OUTPATIENT_CLINIC_OR_DEPARTMENT_OTHER): Payer: Medicare Other

## 2016-07-22 ENCOUNTER — Other Ambulatory Visit: Payer: Self-pay | Admitting: *Deleted

## 2016-07-22 ENCOUNTER — Ambulatory Visit (HOSPITAL_BASED_OUTPATIENT_CLINIC_OR_DEPARTMENT_OTHER): Payer: Medicare Other

## 2016-07-22 VITALS — BP 160/69 | HR 85 | Temp 97.6°F | Resp 18

## 2016-07-22 DIAGNOSIS — D46C Myelodysplastic syndrome with isolated del(5q) chromosomal abnormality: Secondary | ICD-10-CM

## 2016-07-22 DIAGNOSIS — D693 Immune thrombocytopenic purpura: Secondary | ICD-10-CM

## 2016-07-22 DIAGNOSIS — N183 Chronic kidney disease, stage 3 unspecified: Secondary | ICD-10-CM

## 2016-07-22 DIAGNOSIS — D696 Thrombocytopenia, unspecified: Secondary | ICD-10-CM

## 2016-07-22 DIAGNOSIS — D539 Nutritional anemia, unspecified: Secondary | ICD-10-CM

## 2016-07-22 DIAGNOSIS — D631 Anemia in chronic kidney disease: Secondary | ICD-10-CM

## 2016-07-22 LAB — CBC WITH DIFFERENTIAL/PLATELET
BASO%: 0 % (ref 0.0–2.0)
Basophils Absolute: 0 10*3/uL (ref 0.0–0.1)
EOS%: 0.3 % (ref 0.0–7.0)
Eosinophils Absolute: 0 10*3/uL (ref 0.0–0.5)
HCT: 30.4 % — ABNORMAL LOW (ref 38.4–49.9)
HGB: 9.4 g/dL — ABNORMAL LOW (ref 13.0–17.1)
LYMPH%: 49 % (ref 14.0–49.0)
MCH: 24.4 pg — AB (ref 27.2–33.4)
MCHC: 30.9 g/dL — AB (ref 32.0–36.0)
MCV: 79 fL — ABNORMAL LOW (ref 79.3–98.0)
MONO#: 0.4 10*3/uL (ref 0.1–0.9)
MONO%: 13.1 % (ref 0.0–14.0)
NEUT%: 37.6 % — ABNORMAL LOW (ref 39.0–75.0)
NEUTROS ABS: 1.3 10*3/uL — AB (ref 1.5–6.5)
Platelets: 58 10*3/uL — ABNORMAL LOW (ref 140–400)
RBC: 3.85 10*6/uL — AB (ref 4.20–5.82)
RDW: 25.8 % — ABNORMAL HIGH (ref 11.0–14.6)
WBC: 3.4 10*3/uL — AB (ref 4.0–10.3)
lymph#: 1.7 10*3/uL (ref 0.9–3.3)
nRBC: 2 % — ABNORMAL HIGH (ref 0–0)

## 2016-07-22 LAB — TECHNOLOGIST REVIEW

## 2016-07-22 MED ORDER — ROMIPLOSTIM 250 MCG ~~LOC~~ SOLR
85.0000 ug | SUBCUTANEOUS | Status: DC
  Administered 2016-07-22: 85 ug via SUBCUTANEOUS
  Filled 2016-07-22: qty 0.17

## 2016-07-22 NOTE — Patient Instructions (Signed)
Romidepsin injection What is this medicine? ROMIDEPSIN (ROE mi DEP sin) is a chemotherapy drug. It is used to treat cutaneous T-cell lymphoma or peripheral T-cell lymphoma. COMMON BRAND NAME(S): Istodax What should I tell my health care provider before I take this medicine? They need to know if you have any of these conditions: -heart disease -kidney disease -liver disease -low or high levels of potassium or magnesium in the blood -an unusual or allergic reaction to romidepsin, other medicines, foods, dyes, or preservatives -pregnant or trying to get pregnant -breast-feeding How should I use this medicine? This medicine is for infusion into a vein. It is administered in a hospital or clinic by a specially trained health care professional. Talk to your pediatrician regarding the use of this medicine in children. Special care may be needed. What if I miss a dose? It is important not to miss a dose. Call your doctor or health care professional if you are unable to keep an appointment. What may interact with this medicine? -antiviral medications for HIV or AIDS -birth control pills -dexamethasone -medicines for fungal infections like ketoconazole and itraconazole -medicines for irregular heart beat like amiodarone, bepridil, dofetilide, encainide, flecainide, propafenone, quinidine -medicines for seizures like carbamazepine, phenobarbital, phenytoin -medicines for tuberculosis -St. John's Wort -warfarin What should I watch for while using this medicine? This drug may make you feel generally unwell. This is not uncommon, as chemotherapy can affect healthy cells as well as cancer cells. Report any side effects. Continue your course of treatment even though you feel ill unless your doctor tells you to stop. Call your doctor or health care professional for advice if you get a fever, chills or sore throat, or other symptoms of a cold or flu. Do not treat yourself. This drug decreases your body's  ability to fight infections. Try to avoid being around people who are sick. This medicine may increase your risk to bruise or bleed. Call your doctor or health care professional if you notice any unusual bleeding. You may need blood work done while you are taking this medicine. Do not become pregnant while taking this medicine. Women should inform their doctor if they wish to become pregnant or think they might be pregnant. There is a potential for serious side effects to an unborn child. Talk to your health care professional or pharmacist for more information. Do not breast-feed an infant while taking this medicine. What side effects may I notice from receiving this medicine? Side effects that you should report to your doctor or health care professional as soon as possible: -allergic reactions like skin rash, itching or hives, swelling of the face, lips, or tongue -chest pain -cough -fast, irregular heartbeat -feeling faint or lightheaded, falls -fever or chills, sore throat -shortness of breath -unusual bleeding or bruising -unusually weak or tired Side effects that usually do not require medical attention (report to your doctor or health care professional if they continue or are bothersome): -diarrhea -loss of appetite -nausea, vomiting Where should I keep my medicine? This drug is given in a hospital or clinic and will not be stored at home.  2017 Elsevier/Gold Standard (2015-07-10 09:05:58)

## 2016-07-22 NOTE — Progress Notes (Signed)
Ok to give NPLATE with platelet count of 58 per MD [name].

## 2016-07-29 ENCOUNTER — Other Ambulatory Visit (HOSPITAL_BASED_OUTPATIENT_CLINIC_OR_DEPARTMENT_OTHER): Payer: Medicare Other

## 2016-07-29 ENCOUNTER — Ambulatory Visit (HOSPITAL_BASED_OUTPATIENT_CLINIC_OR_DEPARTMENT_OTHER): Payer: Medicare Other

## 2016-07-29 VITALS — BP 153/72 | HR 80 | Temp 97.5°F | Resp 18

## 2016-07-29 DIAGNOSIS — D693 Immune thrombocytopenic purpura: Secondary | ICD-10-CM

## 2016-07-29 DIAGNOSIS — D46C Myelodysplastic syndrome with isolated del(5q) chromosomal abnormality: Secondary | ICD-10-CM

## 2016-07-29 DIAGNOSIS — D539 Nutritional anemia, unspecified: Secondary | ICD-10-CM

## 2016-07-29 DIAGNOSIS — D696 Thrombocytopenia, unspecified: Secondary | ICD-10-CM

## 2016-07-29 DIAGNOSIS — N183 Chronic kidney disease, stage 3 unspecified: Secondary | ICD-10-CM

## 2016-07-29 DIAGNOSIS — D631 Anemia in chronic kidney disease: Secondary | ICD-10-CM

## 2016-07-29 LAB — CBC WITH DIFFERENTIAL/PLATELET
BASO%: 0.3 % (ref 0.0–2.0)
BASOS ABS: 0 10*3/uL (ref 0.0–0.1)
EOS ABS: 0 10*3/uL (ref 0.0–0.5)
EOS%: 0.3 % (ref 0.0–7.0)
HEMATOCRIT: 34.7 % — AB (ref 38.4–49.9)
HEMOGLOBIN: 10 g/dL — AB (ref 13.0–17.1)
LYMPH#: 1.8 10*3/uL (ref 0.9–3.3)
LYMPH%: 61.5 % — ABNORMAL HIGH (ref 14.0–49.0)
MCH: 24.3 pg — ABNORMAL LOW (ref 27.2–33.4)
MCHC: 28.8 g/dL — ABNORMAL LOW (ref 32.0–36.0)
MCV: 84.4 fL (ref 79.3–98.0)
MONO#: 0.4 10*3/uL (ref 0.1–0.9)
MONO%: 14.2 % — ABNORMAL HIGH (ref 0.0–14.0)
NEUT#: 0.7 10*3/uL — ABNORMAL LOW (ref 1.5–6.5)
NEUT%: 23.7 % — ABNORMAL LOW (ref 39.0–75.0)
NRBC: 1 % — AB (ref 0–0)
PLATELETS: 53 10*3/uL — AB (ref 140–400)
RBC: 4.11 10*6/uL — AB (ref 4.20–5.82)
RDW: 25 % — AB (ref 11.0–14.6)
WBC: 2.9 10*3/uL — ABNORMAL LOW (ref 4.0–10.3)

## 2016-07-29 MED ORDER — ROMIPLOSTIM 250 MCG ~~LOC~~ SOLR
85.0000 ug | SUBCUTANEOUS | Status: DC
  Administered 2016-07-29: 85 ug via SUBCUTANEOUS
  Filled 2016-07-29: qty 0.17

## 2016-07-29 NOTE — Patient Instructions (Signed)
Romiplostim injection What is this medicine? ROMIPLOSTIM (roe mi PLOE stim) helps your body make more platelets. This medicine is used to treat low platelets caused by chronic idiopathic thrombocytopenic purpura (ITP). COMMON BRAND NAME(S): Nplate What should I tell my health care provider before I take this medicine? They need to know if you have any of these conditions: -cancer or myelodysplastic syndrome -low blood counts, like low white cell, platelet, or red cell counts -take medicines that treat or prevent blood clots -an unusual or allergic reaction to romiplostim, mannitol, other medicines, foods, dyes, or preservatives -pregnant or trying to get pregnant -breast-feeding How should I use this medicine? This medicine is for injection under the skin. It is given by a health care professional in a hospital or clinic setting. A special MedGuide will be given to you before your injection. Read this information carefully each time. Talk to your pediatrician regarding the use of this medicine in children. Special care may be needed. What if I miss a dose? It is important not to miss your dose. Call your doctor or health care professional if you are unable to keep an appointment. What may interact with this medicine? Interactions are not expected. What should I watch for while using this medicine? Your condition will be monitored carefully while you are receiving this medicine. Visit your prescriber or health care professional for regular checks on your progress and for the needed blood tests. It is important to keep all appointments. What side effects may I notice from receiving this medicine? Side effects that you should report to your doctor or health care professional as soon as possible: -allergic reactions like skin rash, itching or hives, swelling of the face, lips, or tongue -shortness of breath, chest pain, swelling in a leg -unusual bleeding or bruising Side effects that usually  do not require medical attention (report to your doctor or health care professional if they continue or are bothersome): -dizziness -headache -muscle aches -pain in arms and legs -stomach pain -trouble sleeping Where should I keep my medicine? This drug is given in a hospital or clinic and will not be stored at home.  2017 Elsevier/Gold Standard (2008-02-05 15:13:04)  

## 2016-08-05 ENCOUNTER — Ambulatory Visit (HOSPITAL_BASED_OUTPATIENT_CLINIC_OR_DEPARTMENT_OTHER): Payer: Medicare Other

## 2016-08-05 ENCOUNTER — Other Ambulatory Visit (HOSPITAL_BASED_OUTPATIENT_CLINIC_OR_DEPARTMENT_OTHER): Payer: Medicare Other

## 2016-08-05 VITALS — BP 141/63 | HR 71 | Temp 97.5°F | Resp 20

## 2016-08-05 DIAGNOSIS — N183 Chronic kidney disease, stage 3 unspecified: Secondary | ICD-10-CM

## 2016-08-05 DIAGNOSIS — D46C Myelodysplastic syndrome with isolated del(5q) chromosomal abnormality: Secondary | ICD-10-CM

## 2016-08-05 DIAGNOSIS — D631 Anemia in chronic kidney disease: Secondary | ICD-10-CM

## 2016-08-05 DIAGNOSIS — D696 Thrombocytopenia, unspecified: Secondary | ICD-10-CM

## 2016-08-05 DIAGNOSIS — D693 Immune thrombocytopenic purpura: Secondary | ICD-10-CM

## 2016-08-05 DIAGNOSIS — D539 Nutritional anemia, unspecified: Secondary | ICD-10-CM

## 2016-08-05 LAB — CBC WITH DIFFERENTIAL/PLATELET
BASO%: 0.3 % (ref 0.0–2.0)
BASOS ABS: 0 10*3/uL (ref 0.0–0.1)
EOS%: 0.3 % (ref 0.0–7.0)
Eosinophils Absolute: 0 10*3/uL (ref 0.0–0.5)
HCT: 34.2 % — ABNORMAL LOW (ref 38.4–49.9)
HGB: 10.2 g/dL — ABNORMAL LOW (ref 13.0–17.1)
LYMPH#: 1.7 10*3/uL (ref 0.9–3.3)
LYMPH%: 58.4 % — AB (ref 14.0–49.0)
MCH: 24.7 pg — AB (ref 27.2–33.4)
MCHC: 29.8 g/dL — AB (ref 32.0–36.0)
MCV: 82.8 fL (ref 79.3–98.0)
MONO#: 0.4 10*3/uL (ref 0.1–0.9)
MONO%: 14.7 % — AB (ref 0.0–14.0)
NEUT#: 0.8 10*3/uL — ABNORMAL LOW (ref 1.5–6.5)
NEUT%: 26.3 % — AB (ref 39.0–75.0)
Platelets: 60 10*3/uL — ABNORMAL LOW (ref 140–400)
RBC: 4.13 10*6/uL — AB (ref 4.20–5.82)
RDW: 24.1 % — ABNORMAL HIGH (ref 11.0–14.6)
WBC: 2.9 10*3/uL — ABNORMAL LOW (ref 4.0–10.3)
nRBC: 0 % (ref 0–0)

## 2016-08-05 LAB — TECHNOLOGIST REVIEW

## 2016-08-05 MED ORDER — CYANOCOBALAMIN 1000 MCG/ML IJ SOLN
1000.0000 ug | Freq: Once | INTRAMUSCULAR | Status: AC
  Administered 2016-08-05: 1000 ug via INTRAMUSCULAR

## 2016-08-05 MED ORDER — ROMIPLOSTIM 250 MCG ~~LOC~~ SOLR
85.0000 ug | SUBCUTANEOUS | Status: DC
  Administered 2016-08-05: 85 ug via SUBCUTANEOUS
  Filled 2016-08-05: qty 0.17

## 2016-08-05 NOTE — Patient Instructions (Signed)
Darbepoetin Alfa injection What is this medicine? DARBEPOETIN ALFA (dar be POE e tin AL fa) helps your body make more red blood cells. It is used to treat anemia caused by chronic kidney failure and chemotherapy. This medicine may be used for other purposes; ask your health care provider or pharmacist if you have questions. COMMON BRAND NAME(S): Aranesp What should I tell my health care provider before I take this medicine? They need to know if you have any of these conditions: -blood clotting disorders or history of blood clots -cancer patient not on chemotherapy -cystic fibrosis -heart disease, such as angina, heart failure, or a history of a heart attack -hemoglobin level of 12 g/dL or greater -high blood pressure -low levels of folate, iron, or vitamin B12 -seizures -an unusual or allergic reaction to darbepoetin, erythropoietin, albumin, hamster proteins, latex, other medicines, foods, dyes, or preservatives -pregnant or trying to get pregnant -breast-feeding How should I use this medicine? This medicine is for injection into a vein or under the skin. It is usually given by a health care professional in a hospital or clinic setting. If you get this medicine at home, you will be taught how to prepare and give this medicine. Use exactly as directed. Take your medicine at regular intervals. Do not take your medicine more often than directed. It is important that you put your used needles and syringes in a special sharps container. Do not put them in a trash can. If you do not have a sharps container, call your pharmacist or healthcare provider to get one. A special MedGuide will be given to you by the pharmacist with each prescription and refill. Be sure to read this information carefully each time. Talk to your pediatrician regarding the use of this medicine in children. While this medicine may be used in children as young as 1 year for selected conditions, precautions do  apply. Overdosage: If you think you have taken too much of this medicine contact a poison control center or emergency room at once. NOTE: This medicine is only for you. Do not share this medicine with others. What if I miss a dose? If you miss a dose, take it as soon as you can. If it is almost time for your next dose, take only that dose. Do not take double or extra doses. What may interact with this medicine? Do not take this medicine with any of the following medications: -epoetin alfa This list may not describe all possible interactions. Give your health care provider a list of all the medicines, herbs, non-prescription drugs, or dietary supplements you use. Also tell them if you smoke, drink alcohol, or use illegal drugs. Some items may interact with your medicine. What should I watch for while using this medicine? Your condition will be monitored carefully while you are receiving this medicine. You may need blood work done while you are taking this medicine. What side effects may I notice from receiving this medicine? Side effects that you should report to your doctor or health care professional as soon as possible: -allergic reactions like skin rash, itching or hives, swelling of the face, lips, or tongue -breathing problems -changes in vision -chest pain -confusion, trouble speaking or understanding -feeling faint or lightheaded, falls -high blood pressure -muscle aches or pains -pain, swelling, warmth in the leg -rapid weight gain -severe headaches -sudden numbness or weakness of the face, arm or leg -trouble walking, dizziness, loss of balance or coordination -seizures (convulsions) -swelling of the ankles, feet, hands -  unusually weak or tired Side effects that usually do not require medical attention (report to your doctor or health care professional if they continue or are bothersome): -diarrhea -fever, chills (flu-like symptoms) -headaches -nausea, vomiting -redness,  stinging, or swelling at site where injected This list may not describe all possible side effects. Call your doctor for medical advice about side effects. You may report side effects to FDA at 1-800-FDA-1088. Where should I keep my medicine? Keep out of the reach of children. Store in a refrigerator between 2 and 8 degrees C (36 and 46 degrees F). Do not freeze. Do not shake. Throw away any unused portion if using a single-dose vial. Throw away any unused medicine after the expiration date. NOTE: This sheet is a summary. It may not cover all possible information. If you have questions about this medicine, talk to your doctor, pharmacist, or health care provider.  2017 Elsevier/Gold Standard (2016-01-26 19:52:26) Romiplostim injection What is this medicine? ROMIPLOSTIM (roe mi PLOE stim) helps your body make more platelets. This medicine is used to treat low platelets caused by chronic idiopathic thrombocytopenic purpura (ITP). COMMON BRAND NAME(S): Nplate What should I tell my health care provider before I take this medicine? They need to know if you have any of these conditions: -cancer or myelodysplastic syndrome -low blood counts, like low white cell, platelet, or red cell counts -take medicines that treat or prevent blood clots -an unusual or allergic reaction to romiplostim, mannitol, other medicines, foods, dyes, or preservatives -pregnant or trying to get pregnant -breast-feeding How should I use this medicine? This medicine is for injection under the skin. It is given by a health care professional in a hospital or clinic setting. A special MedGuide will be given to you before your injection. Read this information carefully each time. Talk to your pediatrician regarding the use of this medicine in children. Special care may be needed. What if I miss a dose? It is important not to miss your dose. Call your doctor or health care professional if you are unable to keep an  appointment. What may interact with this medicine? Interactions are not expected. What should I watch for while using this medicine? Your condition will be monitored carefully while you are receiving this medicine. Visit your prescriber or health care professional for regular checks on your progress and for the needed blood tests. It is important to keep all appointments. What side effects may I notice from receiving this medicine? Side effects that you should report to your doctor or health care professional as soon as possible: -allergic reactions like skin rash, itching or hives, swelling of the face, lips, or tongue -shortness of breath, chest pain, swelling in a leg -unusual bleeding or bruising Side effects that usually do not require medical attention (report to your doctor or health care professional if they continue or are bothersome): -dizziness -headache -muscle aches -pain in arms and legs -stomach pain -trouble sleeping Where should I keep my medicine? This drug is given in a hospital or clinic and will not be stored at home.  2017 Elsevier/Gold Standard (2008-02-05 15:13:04) Cyanocobalamin, Vitamin B12 injection What is this medicine? CYANOCOBALAMIN (sye an oh koe BAL a min) is a man made form of vitamin B12. Vitamin B12 is used in the growth of healthy blood cells, nerve cells, and proteins in the body. It also helps with the metabolism of fats and carbohydrates. This medicine is used to treat people who can not absorb vitamin B12. This medicine may  be used for other purposes; ask your health care provider or pharmacist if you have questions. COMMON BRAND NAME(S): B-12 Compliance Kit, B-12 Injection Kit, Cyomin, LA-12, Nutri-Twelve, Physicians EZ Use B-12, Primabalt What should I tell my health care provider before I take this medicine? They need to know if you have any of these conditions: -kidney disease -Leber's disease -megaloblastic anemia -an unusual or allergic  reaction to cyanocobalamin, cobalt, other medicines, foods, dyes, or preservatives -pregnant or trying to get pregnant -breast-feeding How should I use this medicine? This medicine is injected into a muscle or deeply under the skin. It is usually given by a health care professional in a clinic or doctor's office. However, your doctor may teach you how to inject yourself. Follow all instructions. Talk to your pediatrician regarding the use of this medicine in children. Special care may be needed. Overdosage: If you think you have taken too much of this medicine contact a poison control center or emergency room at once. NOTE: This medicine is only for you. Do not share this medicine with others. What if I miss a dose? If you are given your dose at a clinic or doctor's office, call to reschedule your appointment. If you give your own injections and you miss a dose, take it as soon as you can. If it is almost time for your next dose, take only that dose. Do not take double or extra doses. What may interact with this medicine? -colchicine -heavy alcohol intake This list may not describe all possible interactions. Give your health care provider a list of all the medicines, herbs, non-prescription drugs, or dietary supplements you use. Also tell them if you smoke, drink alcohol, or use illegal drugs. Some items may interact with your medicine. What should I watch for while using this medicine? Visit your doctor or health care professional regularly. You may need blood work done while you are taking this medicine. You may need to follow a special diet. Talk to your doctor. Limit your alcohol intake and avoid smoking to get the best benefit. What side effects may I notice from receiving this medicine? Side effects that you should report to your doctor or health care professional as soon as possible: -allergic reactions like skin rash, itching or hives, swelling of the face, lips, or tongue -blue tint to  skin -chest tightness, pain -difficulty breathing, wheezing -dizziness -red, swollen painful area on the leg Side effects that usually do not require medical attention (report to your doctor or health care professional if they continue or are bothersome): -diarrhea -headache This list may not describe all possible side effects. Call your doctor for medical advice about side effects. You may report side effects to FDA at 1-800-FDA-1088. Where should I keep my medicine? Keep out of the reach of children. Store at room temperature between 15 and 30 degrees C (59 and 85 degrees F). Protect from light. Throw away any unused medicine after the expiration date. NOTE: This sheet is a summary. It may not cover all possible information. If you have questions about this medicine, talk to your doctor, pharmacist, or health care provider.  2017 Elsevier/Gold Standard (2007-09-18 22:10:20)

## 2016-08-12 ENCOUNTER — Other Ambulatory Visit (HOSPITAL_BASED_OUTPATIENT_CLINIC_OR_DEPARTMENT_OTHER): Payer: Medicare Other

## 2016-08-12 ENCOUNTER — Ambulatory Visit (HOSPITAL_BASED_OUTPATIENT_CLINIC_OR_DEPARTMENT_OTHER): Payer: Medicare Other

## 2016-08-12 VITALS — BP 139/69 | HR 73 | Temp 98.1°F | Resp 18

## 2016-08-12 DIAGNOSIS — D46C Myelodysplastic syndrome with isolated del(5q) chromosomal abnormality: Secondary | ICD-10-CM

## 2016-08-12 DIAGNOSIS — D693 Immune thrombocytopenic purpura: Secondary | ICD-10-CM

## 2016-08-12 DIAGNOSIS — N183 Chronic kidney disease, stage 3 unspecified: Secondary | ICD-10-CM

## 2016-08-12 DIAGNOSIS — D539 Nutritional anemia, unspecified: Secondary | ICD-10-CM

## 2016-08-12 DIAGNOSIS — D631 Anemia in chronic kidney disease: Secondary | ICD-10-CM

## 2016-08-12 DIAGNOSIS — D696 Thrombocytopenia, unspecified: Secondary | ICD-10-CM

## 2016-08-12 LAB — CBC WITH DIFFERENTIAL/PLATELET
BASO%: 0.3 % (ref 0.0–2.0)
Basophils Absolute: 0 10*3/uL (ref 0.0–0.1)
EOS ABS: 0 10*3/uL (ref 0.0–0.5)
EOS%: 0.3 % (ref 0.0–7.0)
HCT: 36.3 % — ABNORMAL LOW (ref 38.4–49.9)
HGB: 10.9 g/dL — ABNORMAL LOW (ref 13.0–17.1)
LYMPH%: 59.2 % — AB (ref 14.0–49.0)
MCH: 24.9 pg — ABNORMAL LOW (ref 27.2–33.4)
MCHC: 30 g/dL — AB (ref 32.0–36.0)
MCV: 82.9 fL (ref 79.3–98.0)
MONO#: 0.4 10*3/uL (ref 0.1–0.9)
MONO%: 11.6 % (ref 0.0–14.0)
NEUT%: 28.6 % — ABNORMAL LOW (ref 39.0–75.0)
NEUTROS ABS: 1 10*3/uL — AB (ref 1.5–6.5)
RBC: 4.38 10*6/uL (ref 4.20–5.82)
RDW: 23.8 % — ABNORMAL HIGH (ref 11.0–14.6)
WBC: 3.5 10*3/uL — AB (ref 4.0–10.3)
lymph#: 2.1 10*3/uL (ref 0.9–3.3)
nRBC: 0 % (ref 0–0)

## 2016-08-12 LAB — TECHNOLOGIST REVIEW

## 2016-08-12 MED ORDER — ROMIPLOSTIM 250 MCG ~~LOC~~ SOLR
170.0000 ug | SUBCUTANEOUS | Status: DC
  Administered 2016-08-12: 170 ug via SUBCUTANEOUS
  Filled 2016-08-12: qty 0.34

## 2016-08-12 NOTE — Patient Instructions (Signed)
Romiplostim injection What is this medicine? ROMIPLOSTIM (roe mi PLOE stim) helps your body make more platelets. This medicine is used to treat low platelets caused by chronic idiopathic thrombocytopenic purpura (ITP). COMMON BRAND NAME(S): Nplate What should I tell my health care provider before I take this medicine? They need to know if you have any of these conditions: -cancer or myelodysplastic syndrome -low blood counts, like low white cell, platelet, or red cell counts -take medicines that treat or prevent blood clots -an unusual or allergic reaction to romiplostim, mannitol, other medicines, foods, dyes, or preservatives -pregnant or trying to get pregnant -breast-feeding How should I use this medicine? This medicine is for injection under the skin. It is given by a health care professional in a hospital or clinic setting. A special MedGuide will be given to you before your injection. Read this information carefully each time. Talk to your pediatrician regarding the use of this medicine in children. Special care may be needed. What if I miss a dose? It is important not to miss your dose. Call your doctor or health care professional if you are unable to keep an appointment. What may interact with this medicine? Interactions are not expected. What should I watch for while using this medicine? Your condition will be monitored carefully while you are receiving this medicine. Visit your prescriber or health care professional for regular checks on your progress and for the needed blood tests. It is important to keep all appointments. What side effects may I notice from receiving this medicine? Side effects that you should report to your doctor or health care professional as soon as possible: -allergic reactions like skin rash, itching or hives, swelling of the face, lips, or tongue -shortness of breath, chest pain, swelling in a leg -unusual bleeding or bruising Side effects that usually  do not require medical attention (report to your doctor or health care professional if they continue or are bothersome): -dizziness -headache -muscle aches -pain in arms and legs -stomach pain -trouble sleeping Where should I keep my medicine? This drug is given in a hospital or clinic and will not be stored at home.  2017 Elsevier/Gold Standard (2008-02-05 15:13:04)  

## 2016-08-17 ENCOUNTER — Encounter: Payer: Self-pay | Admitting: Family Medicine

## 2016-08-17 ENCOUNTER — Ambulatory Visit (INDEPENDENT_AMBULATORY_CARE_PROVIDER_SITE_OTHER): Payer: Medicare Other | Admitting: Family Medicine

## 2016-08-17 VITALS — BP 128/74 | HR 80 | Temp 97.5°F | Resp 20 | Ht 69.0 in | Wt 188.0 lb

## 2016-08-17 DIAGNOSIS — J069 Acute upper respiratory infection, unspecified: Secondary | ICD-10-CM | POA: Diagnosis not present

## 2016-08-17 MED ORDER — AZITHROMYCIN 250 MG PO TABS: ORAL_TABLET | ORAL | 0 refills | Status: DC

## 2016-08-17 NOTE — Progress Notes (Signed)
Subjective:    Patient ID: Ricardo Watkins, male    DOB: 1931-01-15, 83 y.o.   MRN: [id number]  HPI This began last Friday with some rhinorrhea. By Saturday he developed a mild diffuse headache, sore throat, and body aches. Over the weekend he developed a cough, nausea and vomiting 1, and now he has some mild diarrhea. Symptoms are consistent with a viral syndrome. He denies any chest pain shortness of breath or dyspnea on exertion. He denies any hemoptysis. He denies any purulent sputum. He denies any otalgia or sinus pain. He denies any abdominal pain Past Medical History:  Diagnosis Date  . Adenomatous colon polyp   . CAD (coronary artery disease)    30% LAD Stenosis, 70% ramus intermedius stenosis, treated with PTCA and angioplasty by Dr Albertine Patricia 2004  . Cataract    right eye  . Cough, persistent 11/04/2015  . Deficiency anemia 04/19/2014  . Diverticulosis   . DM (diabetes mellitus) ([place])   . DVT (deep venous thrombosis) ([place])    secondary to surgery  . Dyslipidemia   . Hyperlipidemia   . Hypertension   . Inguinal hernia    right  . 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 Microcytic anemia 07/07/2015  . 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 Non Hodgkin's lymphoma ([place])   . Pleural effusion, left 11/04/2015  . 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 PVD (peripheral vascular disease) ([place])    rt renal artery stent  . Renal insufficiency   . Thrombocytopenia ([place])   . Thrombotic stroke ([place]) 09/10/2011   January 18, 2011 infarct genu & post limb R internal capsule - acute; previous lacunar infarcts/extensive white matter dis  . Vitamin D deficiency    Past Surgical History:  Procedure Laterality Date  . APPENDECTOMY     patient ?  Marland Kitchen CARDIAC CATHETERIZATION    . CHEST TUBE INSERTION Left 02/10/2016   Procedure: INSERTION PLEURAL DRAINAGE CATHETER;  Surgeon: [name],  MD;  Location: [facility];  Service: Thoracic;  Laterality: Left;  . CHEST TUBE INSERTION Left 04/08/2016   Procedure: CHEST TUBE INSERTION;  Surgeon: [name], MD;  Location: [facility];  Service: Thoracic;  Laterality: Left;  . COLONOSCOPY    . CORONARY ANGIOPLASTY    . CYSTOURETHROSCOPY     ROBOTIC ARM NUCLETRON SEED IMPLANTATION OF PROSTATE  . EXPLORATORY LAPAROTOMY     For evaluation of lymphoma  . REMOVAL OF PLEURAL DRAINAGE CATHETER Left 04/08/2016   Procedure: REMOVAL OF PLEURAL DRAINAGE CATHETER;  Surgeon: [name], MD;  [facility];  Service: Thoracic;  Laterality: Left;   Current Outpatient Prescriptions on File Prior to Visit  Medication Sig Dispense Refill  . acetaminophen (TYLENOL) 500 MG tablet Take 1,000 mg by mouth every 6 (six) hours as needed for mild pain, moderate pain or headache.     . clopidogrel (PLAVIX) 75 MG tablet Take 75 mg by mouth daily.    . cyanocobalamin (,VITAMIN B-12,) 1000 MCG/ML injection Inject 1 mL (1,000 mcg total) into the muscle every 30 (thirty) days. 10 mL 3  . Cyanocobalamin (B-12 COMPLIANCE INJECTION IJ) Inject as directed every 30 (thirty) days. Last injection on dec 22nd 2017    . Dextromethorphan Polistirex (DELSYM PO) Take 15 mLs by mouth at bedtime.    . furosemide (LASIX) 40 MG  tablet TAKE 1 TABLET BY MOUTH DAILY 30 tablet 3  . glucose blood test strip 1 each by Other route daily. Fasting each morning 100 each 3  . hydrocortisone cream 1 % Apply 1 application topically at bedtime as needed for itching.    . Insulin Glargine (LANTUS SOLOSTAR) 100 UNIT/ML Solostar Pen INJECT 25 UNITS EVERY NIGHT AT BEDTIME 5 pen 3  . meclizine (ANTIVERT) 12.5 MG tablet Take 1 tablet (12.5 mg total) by mouth 3 (three) times daily as needed for dizziness. 30 tablet 0  . metoprolol succinate (TOPROL-XL) 100 MG 24 hr tablet TAKE 1 TABLET BY MOUTH ONCE A DAY 90 tablet 3  . Multiple Vitamin (MULTIVITAMIN WITH MINERALS) TABS tablet Take 1 tablet by mouth at  bedtime.    Marland Kitchen OVER THE COUNTER MEDICATION Place 1 drop into both eyes daily as needed (dry eyes). Over the counter lubricating eye drop    . SYRINGE-NEEDLE, DISP, 3 ML (LUER LOCK SAFETY SYRINGES) 25G X 1" 3 ML MISC Use with B 12 injections q month 100 each 3  . tamsulosin (FLOMAX) 0.4 MG CAPS capsule TAKE 1 CAPSULE BY MOUTH ONCE DAILY 90 capsule 3   Current Facility-Administered Medications on File Prior to Visit  Medication Dose Route Frequency Provider Last Rate Last Dose  . cyanocobalamin ((VITAMIN B-12)) injection 1,000 mcg  1,000 mcg Subcutaneous Q30 days [name], MD   1,000 mcg at 07/06/16 1430   Allergies  Allergen Reactions  . Glucophage [Metformin Hydrochloride] Other (See Comments)    Chest pain  . Zetia [Ezetimibe] Other (See Comments)    weakness  . Fenofibrate Rash  . Niacin-Lovastatin Er Rash   Social History   Social History  . Marital status: Married    Spouse name: N/A  . Number of children: 4  . Years of education: N/A   Occupational History  . retired    Social History Main Topics  . Smoking status: Former Smoker    Packs/day: 0.50    Years: 20.00    Types: Pipe, Cigarettes    Quit date: 06/21/1958  . Smokeless tobacco: Never Used  . Alcohol use No  . Drug use: No  . Sexual activity: Yes    Partners: Female   Other Topics Concern  . Not on file   Social History Narrative  . No narrative on file      Review of Systems  All other systems reviewed and are negative.      Objective:   Physical Exam  Constitutional: He appears well-developed and well-nourished. No distress.  HENT:  Head: Normocephalic and atraumatic.  Right Ear: External ear normal.  Left Ear: External ear normal.  Nose: Nose normal.  Mouth/Throat: Oropharynx is clear and moist. No oropharyngeal exudate.  Eyes: Conjunctivae and EOM are normal. Pupils are equal, round, and reactive to light. Right eye exhibits no discharge. Left eye exhibits no discharge.  Neck: Neck  supple.  Cardiovascular: Normal rate, regular rhythm and normal heart sounds.   No murmur heard. Pulmonary/Chest: Effort normal and breath sounds normal. No respiratory distress. He has no wheezes. He has no rales. He exhibits no tenderness.  Abdominal: Soft. Bowel sounds are normal. He exhibits no distension and no mass. There is no tenderness. There is no rebound and no guarding.  Lymphadenopathy:    He has no cervical adenopathy.  Skin: He is not diaphoretic.  Vitals reviewed.         Assessment & Plan:  URI, acute - Plan:  azithromycin (ZITHROMAX) 250 MG tablet  A she has a viral upper respiratory infection. I recommended tincture of time. He can use Mucinex DM as needed for cough and congestion. He can use Coricidin HBP for head congestion and rhinorrhea. He can use Cepacol lozenges for sore throat. Symptoms should be better by Friday. If symptoms worsen, I did give the patient prescription for a Z-Pak because of his poor health and his compromised immune status. If he develops an extremely high fever, shortness of breath, purulent sputum, he should start taking the medication immediately

## 2016-08-19 ENCOUNTER — Ambulatory Visit (HOSPITAL_BASED_OUTPATIENT_CLINIC_OR_DEPARTMENT_OTHER): Payer: Medicare Other

## 2016-08-19 ENCOUNTER — Other Ambulatory Visit (HOSPITAL_BASED_OUTPATIENT_CLINIC_OR_DEPARTMENT_OTHER): Payer: Medicare Other

## 2016-08-19 VITALS — BP 152/71 | HR 77 | Temp 97.6°F | Resp 18

## 2016-08-19 DIAGNOSIS — D539 Nutritional anemia, unspecified: Secondary | ICD-10-CM

## 2016-08-19 DIAGNOSIS — D46C Myelodysplastic syndrome with isolated del(5q) chromosomal abnormality: Secondary | ICD-10-CM

## 2016-08-19 DIAGNOSIS — D693 Immune thrombocytopenic purpura: Secondary | ICD-10-CM | POA: Diagnosis not present

## 2016-08-19 DIAGNOSIS — D631 Anemia in chronic kidney disease: Secondary | ICD-10-CM

## 2016-08-19 DIAGNOSIS — D696 Thrombocytopenia, unspecified: Secondary | ICD-10-CM

## 2016-08-19 DIAGNOSIS — N183 Chronic kidney disease, stage 3 unspecified: Secondary | ICD-10-CM

## 2016-08-19 LAB — CBC WITH DIFFERENTIAL/PLATELET
BASO%: 0.7 % (ref 0.0–2.0)
Basophils Absolute: 0 10*3/uL (ref 0.0–0.1)
EOS ABS: 0 10*3/uL (ref 0.0–0.5)
EOS%: 0.7 % (ref 0.0–7.0)
HCT: 36.2 % — ABNORMAL LOW (ref 38.4–49.9)
HEMOGLOBIN: 10.8 g/dL — AB (ref 13.0–17.1)
LYMPH%: 50.3 % — ABNORMAL HIGH (ref 14.0–49.0)
MCH: 24.5 pg — ABNORMAL LOW (ref 27.2–33.4)
MCHC: 29.8 g/dL — ABNORMAL LOW (ref 32.0–36.0)
MCV: 82.1 fL (ref 79.3–98.0)
MONO#: 0.6 10*3/uL (ref 0.1–0.9)
MONO%: 20.7 % — AB (ref 0.0–14.0)
NEUT#: 0.8 10*3/uL — ABNORMAL LOW (ref 1.5–6.5)
NEUT%: 27.6 % — ABNORMAL LOW (ref 39.0–75.0)
NRBC: 0 % (ref 0–0)
Platelets: 53 10*3/uL — ABNORMAL LOW (ref 140–400)
RBC: 4.41 10*6/uL (ref 4.20–5.82)
RDW: 23.8 % — AB (ref 11.0–14.6)
WBC: 2.9 10*3/uL — AB (ref 4.0–10.3)
lymph#: 1.5 10*3/uL (ref 0.9–3.3)

## 2016-08-19 LAB — TECHNOLOGIST REVIEW

## 2016-08-19 MED ORDER — ROMIPLOSTIM 250 MCG ~~LOC~~ SOLR
2.0000 ug/kg | SUBCUTANEOUS | Status: DC
  Administered 2016-08-19: 170 ug via SUBCUTANEOUS
  Filled 2016-08-19: qty 0.34

## 2016-08-19 NOTE — Patient Instructions (Signed)
Romiplostim injection What is this medicine? ROMIPLOSTIM (roe mi PLOE stim) helps your body make more platelets. This medicine is used to treat low platelets caused by chronic idiopathic thrombocytopenic purpura (ITP). COMMON BRAND NAME(S): Nplate What should I tell my health care provider before I take this medicine? They need to know if you have any of these conditions: -cancer or myelodysplastic syndrome -low blood counts, like low white cell, platelet, or red cell counts -take medicines that treat or prevent blood clots -an unusual or allergic reaction to romiplostim, mannitol, other medicines, foods, dyes, or preservatives -pregnant or trying to get pregnant -breast-feeding How should I use this medicine? This medicine is for injection under the skin. It is given by a health care professional in a hospital or clinic setting. A special MedGuide will be given to you before your injection. Read this information carefully each time. Talk to your pediatrician regarding the use of this medicine in children. Special care may be needed. What if I miss a dose? It is important not to miss your dose. Call your doctor or health care professional if you are unable to keep an appointment. What may interact with this medicine? Interactions are not expected. What should I watch for while using this medicine? Your condition will be monitored carefully while you are receiving this medicine. Visit your prescriber or health care professional for regular checks on your progress and for the needed blood tests. It is important to keep all appointments. What side effects may I notice from receiving this medicine? Side effects that you should report to your doctor or health care professional as soon as possible: -allergic reactions like skin rash, itching or hives, swelling of the face, lips, or tongue -shortness of breath, chest pain, swelling in a leg -unusual bleeding or bruising Side effects that usually  do not require medical attention (report to your doctor or health care professional if they continue or are bothersome): -dizziness -headache -muscle aches -pain in arms and legs -stomach pain -trouble sleeping Where should I keep my medicine? This drug is given in a hospital or clinic and will not be stored at home.  2017 Elsevier/Gold Standard (2008-02-05 15:13:04)  

## 2016-08-26 ENCOUNTER — Other Ambulatory Visit (HOSPITAL_BASED_OUTPATIENT_CLINIC_OR_DEPARTMENT_OTHER): Payer: Medicare Other

## 2016-08-26 ENCOUNTER — Ambulatory Visit (HOSPITAL_BASED_OUTPATIENT_CLINIC_OR_DEPARTMENT_OTHER): Payer: Medicare Other

## 2016-08-26 VITALS — BP 150/83 | HR 76 | Temp 97.0°F | Resp 20

## 2016-08-26 DIAGNOSIS — D693 Immune thrombocytopenic purpura: Secondary | ICD-10-CM

## 2016-08-26 DIAGNOSIS — D631 Anemia in chronic kidney disease: Secondary | ICD-10-CM

## 2016-08-26 DIAGNOSIS — D46C Myelodysplastic syndrome with isolated del(5q) chromosomal abnormality: Secondary | ICD-10-CM

## 2016-08-26 DIAGNOSIS — D696 Thrombocytopenia, unspecified: Secondary | ICD-10-CM

## 2016-08-26 DIAGNOSIS — N183 Chronic kidney disease, stage 3 unspecified: Secondary | ICD-10-CM

## 2016-08-26 DIAGNOSIS — D539 Nutritional anemia, unspecified: Secondary | ICD-10-CM

## 2016-08-26 LAB — CBC WITH DIFFERENTIAL/PLATELET
BASO%: 0.3 % (ref 0.0–2.0)
BASOS ABS: 0 10*3/uL (ref 0.0–0.1)
EOS%: 0.5 % (ref 0.0–7.0)
Eosinophils Absolute: 0 10*3/uL (ref 0.0–0.5)
HEMATOCRIT: 37.3 % — AB (ref 38.4–49.9)
HEMOGLOBIN: 11.2 g/dL — AB (ref 13.0–17.1)
LYMPH#: 1.8 10*3/uL (ref 0.9–3.3)
LYMPH%: 46.6 % (ref 14.0–49.0)
MCH: 24.6 pg — AB (ref 27.2–33.4)
MCHC: 30 g/dL — AB (ref 32.0–36.0)
MCV: 81.8 fL (ref 79.3–98.0)
MONO#: 0.5 10*3/uL (ref 0.1–0.9)
MONO%: 11.9 % (ref 0.0–14.0)
NEUT#: 1.6 10*3/uL (ref 1.5–6.5)
NEUT%: 40.7 % (ref 39.0–75.0)
Platelets: 86 10*3/uL — ABNORMAL LOW (ref 140–400)
RBC: 4.56 10*6/uL (ref 4.20–5.82)
RDW: 23.6 % — ABNORMAL HIGH (ref 11.0–14.6)
WBC: 3.9 10*3/uL — ABNORMAL LOW (ref 4.0–10.3)
nRBC: 0 % (ref 0–0)

## 2016-08-26 LAB — TECHNOLOGIST REVIEW

## 2016-08-26 MED ORDER — ROMIPLOSTIM 250 MCG ~~LOC~~ SOLR
170.0000 ug | SUBCUTANEOUS | Status: DC
  Administered 2016-08-26: 170 ug via SUBCUTANEOUS
  Filled 2016-08-26: qty 0.34

## 2016-08-26 NOTE — Patient Instructions (Signed)
Romiplostim injection What is this medicine? ROMIPLOSTIM (roe mi PLOE stim) helps your body make more platelets. This medicine is used to treat low platelets caused by chronic idiopathic thrombocytopenic purpura (ITP). COMMON BRAND NAME(S): Nplate What should I tell my health care provider before I take this medicine? They need to know if you have any of these conditions: -cancer or myelodysplastic syndrome -low blood counts, like low white cell, platelet, or red cell counts -take medicines that treat or prevent blood clots -an unusual or allergic reaction to romiplostim, mannitol, other medicines, foods, dyes, or preservatives -pregnant or trying to get pregnant -breast-feeding How should I use this medicine? This medicine is for injection under the skin. It is given by a health care professional in a hospital or clinic setting. A special MedGuide will be given to you before your injection. Read this information carefully each time. Talk to your pediatrician regarding the use of this medicine in children. Special care may be needed. What if I miss a dose? It is important not to miss your dose. Call your doctor or health care professional if you are unable to keep an appointment. What may interact with this medicine? Interactions are not expected. What should I watch for while using this medicine? Your condition will be monitored carefully while you are receiving this medicine. Visit your prescriber or health care professional for regular checks on your progress and for the needed blood tests. It is important to keep all appointments. What side effects may I notice from receiving this medicine? Side effects that you should report to your doctor or health care professional as soon as possible: -allergic reactions like skin rash, itching or hives, swelling of the face, lips, or tongue -shortness of breath, chest pain, swelling in a leg -unusual bleeding or bruising Side effects that usually  do not require medical attention (report to your doctor or health care professional if they continue or are bothersome): -dizziness -headache -muscle aches -pain in arms and legs -stomach pain -trouble sleeping Where should I keep my medicine? This drug is given in a hospital or clinic and will not be stored at home.  2017 Elsevier/Gold Standard (2008-02-05 15:13:04)  

## 2016-09-01 ENCOUNTER — Ambulatory Visit: Payer: Medicare Other | Admitting: Podiatry

## 2016-09-02 ENCOUNTER — Encounter (HOSPITAL_BASED_OUTPATIENT_CLINIC_OR_DEPARTMENT_OTHER): Payer: Medicare Other

## 2016-09-02 ENCOUNTER — Ambulatory Visit: Payer: Medicare Other

## 2016-09-02 ENCOUNTER — Ambulatory Visit (HOSPITAL_BASED_OUTPATIENT_CLINIC_OR_DEPARTMENT_OTHER): Payer: Medicare Other | Admitting: Hematology and Oncology

## 2016-09-02 ENCOUNTER — Other Ambulatory Visit (HOSPITAL_BASED_OUTPATIENT_CLINIC_OR_DEPARTMENT_OTHER): Payer: Medicare Other

## 2016-09-02 ENCOUNTER — Telehealth: Payer: Self-pay | Admitting: Hematology and Oncology

## 2016-09-02 DIAGNOSIS — D539 Nutritional anemia, unspecified: Secondary | ICD-10-CM

## 2016-09-02 DIAGNOSIS — D693 Immune thrombocytopenic purpura: Secondary | ICD-10-CM

## 2016-09-02 DIAGNOSIS — N183 Chronic kidney disease, stage 3 unspecified: Secondary | ICD-10-CM

## 2016-09-02 DIAGNOSIS — D631 Anemia in chronic kidney disease: Secondary | ICD-10-CM | POA: Diagnosis not present

## 2016-09-02 DIAGNOSIS — D46C Myelodysplastic syndrome with isolated del(5q) chromosomal abnormality: Secondary | ICD-10-CM

## 2016-09-02 DIAGNOSIS — J90 Pleural effusion, not elsewhere classified: Secondary | ICD-10-CM

## 2016-09-02 DIAGNOSIS — D696 Thrombocytopenia, unspecified: Secondary | ICD-10-CM

## 2016-09-02 LAB — CBC WITH DIFFERENTIAL/PLATELET
BASO%: 0.2 % (ref 0.0–2.0)
Basophils Absolute: 0 10*3/uL (ref 0.0–0.1)
EOS ABS: 0 10*3/uL (ref 0.0–0.5)
EOS%: 0.3 % (ref 0.0–7.0)
HEMATOCRIT: 36.9 % — AB (ref 38.4–49.9)
HEMOGLOBIN: 11.1 g/dL — AB (ref 13.0–17.1)
LYMPH#: 1.9 10*3/uL (ref 0.9–3.3)
LYMPH%: 31.3 % (ref 14.0–49.0)
MCH: 24.4 pg — ABNORMAL LOW (ref 27.2–33.4)
MCHC: 30.1 g/dL — AB (ref 32.0–36.0)
MCV: 81.1 fL (ref 79.3–98.0)
MONO#: 0.9 10*3/uL (ref 0.1–0.9)
MONO%: 14.4 % — ABNORMAL HIGH (ref 0.0–14.0)
NEUT#: 3.3 10*3/uL (ref 1.5–6.5)
NEUT%: 53.8 % (ref 39.0–75.0)
RBC: 4.55 10*6/uL (ref 4.20–5.82)
RDW: 23.6 % — ABNORMAL HIGH (ref 11.0–14.6)
WBC: 6.2 10*3/uL (ref 4.0–10.3)
nRBC: 0 % (ref 0–0)

## 2016-09-02 LAB — TECHNOLOGIST REVIEW

## 2016-09-02 MED ORDER — CYANOCOBALAMIN 1000 MCG/ML IJ SOLN
1000.0000 ug | Freq: Once | INTRAMUSCULAR | Status: AC
  Administered 2016-09-02: 1000 ug via INTRAMUSCULAR

## 2016-09-02 MED ORDER — ROMIPLOSTIM 250 MCG ~~LOC~~ SOLR
2.0000 ug/kg | SUBCUTANEOUS | Status: DC
  Administered 2016-09-02: 170 ug via SUBCUTANEOUS
  Filled 2016-09-02: qty 0.34

## 2016-09-02 NOTE — Patient Instructions (Signed)
Darbepoetin Alfa injection What is this medicine? DARBEPOETIN ALFA (dar be POE e tin AL fa) helps your body make more red blood cells. It is used to treat anemia caused by chronic kidney failure and chemotherapy. This medicine may be used for other purposes; ask your health care provider or pharmacist if you have questions. COMMON BRAND NAME(S): Aranesp What should I tell my health care provider before I take this medicine? They need to know if you have any of these conditions: -blood clotting disorders or history of blood clots -cancer patient not on chemotherapy -cystic fibrosis -heart disease, such as angina, heart failure, or a history of a heart attack -hemoglobin level of 12 g/dL or greater -high blood pressure -low levels of folate, iron, or vitamin B12 -seizures -an unusual or allergic reaction to darbepoetin, erythropoietin, albumin, hamster proteins, latex, other medicines, foods, dyes, or preservatives -pregnant or trying to get pregnant -breast-feeding How should I use this medicine? This medicine is for injection into a vein or under the skin. It is usually given by a health care professional in a hospital or clinic setting. If you get this medicine at home, you will be taught how to prepare and give this medicine. Use exactly as directed. Take your medicine at regular intervals. Do not take your medicine more often than directed. It is important that you put your used needles and syringes in a special sharps container. Do not put them in a trash can. If you do not have a sharps container, call your pharmacist or healthcare provider to get one. A special MedGuide will be given to you by the pharmacist with each prescription and refill. Be sure to read this information carefully each time. Talk to your pediatrician regarding the use of this medicine in children. While this medicine may be used in children as young as 1 year for selected conditions, precautions do  apply. Overdosage: If you think you have taken too much of this medicine contact a poison control center or emergency room at once. NOTE: This medicine is only for you. Do not share this medicine with others. What if I miss a dose? If you miss a dose, take it as soon as you can. If it is almost time for your next dose, take only that dose. Do not take double or extra doses. What may interact with this medicine? Do not take this medicine with any of the following medications: -epoetin alfa This list may not describe all possible interactions. Give your health care provider a list of all the medicines, herbs, non-prescription drugs, or dietary supplements you use. Also tell them if you smoke, drink alcohol, or use illegal drugs. Some items may interact with your medicine. What should I watch for while using this medicine? Your condition will be monitored carefully while you are receiving this medicine. You may need blood work done while you are taking this medicine. What side effects may I notice from receiving this medicine? Side effects that you should report to your doctor or health care professional as soon as possible: -allergic reactions like skin rash, itching or hives, swelling of the face, lips, or tongue -breathing problems -changes in vision -chest pain -confusion, trouble speaking or understanding -feeling faint or lightheaded, falls -high blood pressure -muscle aches or pains -pain, swelling, warmth in the leg -rapid weight gain -severe headaches -sudden numbness or weakness of the face, arm or leg -trouble walking, dizziness, loss of balance or coordination -seizures (convulsions) -swelling of the ankles, feet, hands -  unusually weak or tired Side effects that usually do not require medical attention (report to your doctor or health care professional if they continue or are bothersome): -diarrhea -fever, chills (flu-like symptoms) -headaches -nausea, vomiting -redness,  stinging, or swelling at site where injected This list may not describe all possible side effects. Call your doctor for medical advice about side effects. You may report side effects to FDA at 1-800-FDA-1088. Where should I keep my medicine? Keep out of the reach of children. Store in a refrigerator between 2 and 8 degrees C (36 and 46 degrees F). Do not freeze. Do not shake. Throw away any unused portion if using a single-dose vial. Throw away any unused medicine after the expiration date. NOTE: This sheet is a summary. It may not cover all possible information. If you have questions about this medicine, talk to your doctor, pharmacist, or health care provider.  2017 Elsevier/Gold Standard (2016-01-26 19:52:26) Romiplostim injection What is this medicine? ROMIPLOSTIM (roe mi PLOE stim) helps your body make more platelets. This medicine is used to treat low platelets caused by chronic idiopathic thrombocytopenic purpura (ITP). COMMON BRAND NAME(S): Nplate What should I tell my health care provider before I take this medicine? They need to know if you have any of these conditions: -cancer or myelodysplastic syndrome -low blood counts, like low white cell, platelet, or red cell counts -take medicines that treat or prevent blood clots -an unusual or allergic reaction to romiplostim, mannitol, other medicines, foods, dyes, or preservatives -pregnant or trying to get pregnant -breast-feeding How should I use this medicine? This medicine is for injection under the skin. It is given by a health care professional in a hospital or clinic setting. A special MedGuide will be given to you before your injection. Read this information carefully each time. Talk to your pediatrician regarding the use of this medicine in children. Special care may be needed. What if I miss a dose? It is important not to miss your dose. Call your doctor or health care professional if you are unable to keep an  appointment. What may interact with this medicine? Interactions are not expected. What should I watch for while using this medicine? Your condition will be monitored carefully while you are receiving this medicine. Visit your prescriber or health care professional for regular checks on your progress and for the needed blood tests. It is important to keep all appointments. What side effects may I notice from receiving this medicine? Side effects that you should report to your doctor or health care professional as soon as possible: -allergic reactions like skin rash, itching or hives, swelling of the face, lips, or tongue -shortness of breath, chest pain, swelling in a leg -unusual bleeding or bruising Side effects that usually do not require medical attention (report to your doctor or health care professional if they continue or are bothersome): -dizziness -headache -muscle aches -pain in arms and legs -stomach pain -trouble sleeping Where should I keep my medicine? This drug is given in a hospital or clinic and will not be stored at home.  2017 Elsevier/Gold Standard (2008-02-05 15:13:04) Cyanocobalamin, Vitamin B12 injection What is this medicine? CYANOCOBALAMIN (sye an oh koe BAL a min) is a man made form of vitamin B12. Vitamin B12 is used in the growth of healthy blood cells, nerve cells, and proteins in the body. It also helps with the metabolism of fats and carbohydrates. This medicine is used to treat people who can not absorb vitamin B12. This medicine may  be used for other purposes; ask your health care provider or pharmacist if you have questions. COMMON BRAND NAME(S): B-12 Compliance Kit, B-12 Injection Kit, Cyomin, LA-12, Nutri-Twelve, Physicians EZ Use B-12, Primabalt What should I tell my health care provider before I take this medicine? They need to know if you have any of these conditions: -kidney disease -Leber's disease -megaloblastic anemia -an unusual or allergic  reaction to cyanocobalamin, cobalt, other medicines, foods, dyes, or preservatives -pregnant or trying to get pregnant -breast-feeding How should I use this medicine? This medicine is injected into a muscle or deeply under the skin. It is usually given by a health care professional in a clinic or doctor's office. However, your doctor may teach you how to inject yourself. Follow all instructions. Talk to your pediatrician regarding the use of this medicine in children. Special care may be needed. Overdosage: If you think you have taken too much of this medicine contact a poison control center or emergency room at once. NOTE: This medicine is only for you. Do not share this medicine with others. What if I miss a dose? If you are given your dose at a clinic or doctor's office, call to reschedule your appointment. If you give your own injections and you miss a dose, take it as soon as you can. If it is almost time for your next dose, take only that dose. Do not take double or extra doses. What may interact with this medicine? -colchicine -heavy alcohol intake This list may not describe all possible interactions. Give your health care provider a list of all the medicines, herbs, non-prescription drugs, or dietary supplements you use. Also tell them if you smoke, drink alcohol, or use illegal drugs. Some items may interact with your medicine. What should I watch for while using this medicine? Visit your doctor or health care professional regularly. You may need blood work done while you are taking this medicine. You may need to follow a special diet. Talk to your doctor. Limit your alcohol intake and avoid smoking to get the best benefit. What side effects may I notice from receiving this medicine? Side effects that you should report to your doctor or health care professional as soon as possible: -allergic reactions like skin rash, itching or hives, swelling of the face, lips, or tongue -blue tint to  skin -chest tightness, pain -difficulty breathing, wheezing -dizziness -red, swollen painful area on the leg Side effects that usually do not require medical attention (report to your doctor or health care professional if they continue or are bothersome): -diarrhea -headache This list may not describe all possible side effects. Call your doctor for medical advice about side effects. You may report side effects to FDA at 1-800-FDA-1088. Where should I keep my medicine? Keep out of the reach of children. Store at room temperature between 15 and 30 degrees C (59 and 85 degrees F). Protect from light. Throw away any unused medicine after the expiration date. NOTE: This sheet is a summary. It may not cover all possible information. If you have questions about this medicine, talk to your doctor, pharmacist, or health care provider.  2017 Elsevier/Gold Standard (2007-09-18 22:10:20)

## 2016-09-02 NOTE — Telephone Encounter (Signed)
Gave patient AVS and calender per 09/02/2016 los 

## 2016-09-03 ENCOUNTER — Encounter: Payer: Self-pay | Admitting: Hematology and Oncology

## 2016-09-03 NOTE — Assessment & Plan Note (Signed)
The cause of pleural effusion is unknown. PET/CT scan did not show definitive signs of lymphoma He had recent pleurodesis He is not too symptomatic with shortness of breath Continue close observation

## 2016-09-03 NOTE — Assessment & Plan Note (Addendum)
He has remarkable response to Nplate injection.  He has resolution of neutropenia, improvement of anemia as well as improvement of thrombocytopenia. He will continue vitamin B12 injection along with Nplate injection today. I will see him back in 3 months. The goal of injection is to keep platelet count greater than 50,000 He will get darbepoetin injection to keep hemoglobin greater than 10

## 2016-09-03 NOTE — Progress Notes (Signed)
[facility] OFFICE PROGRESS NOTE  Patient Care Team: [name], MD as PCP - General (Family Medicine) Heath Lark, MD as Consulting Physician (Hematology and Oncology) [name], MD as Consulting Physician (Cardiology)  SUMMARY OF ONCOLOGIC HISTORY: Oncology History   Calculated R-IPSS score at low risk     MDS (myelodysplastic syndrome) with 5q deletion ([place])   11/07/2014 Bone Marrow Biopsy    Accession: [id number] marrow biopsy showed myelodysplastic syndrome with 5q deletion.      04/02/2015 - 07/01/2016 Chemotherapy    He started taking Promacta daily for thrombocytopenia, stopped due to lack of response      07/15/2016 Miscellaneous    Starting July 15, 2016, he is receiving Nplate injection along with darbepoetin       INTERVAL HISTORY: Please see below for problem oriented charting. He is here accompanied by family members. He denies recent chest pain or shortness of breath. The patient denies any recent signs or symptoms of bleeding such as spontaneous epistaxis, hematuria or hematochezia. There were no reported recent infection  REVIEW OF SYSTEMS:   Constitutional: Denies fevers, chills or abnormal weight loss Eyes: Denies blurriness of vision Ears, nose, mouth, throat, and face: Denies mucositis or sore throat Respiratory: Denies cough, dyspnea or wheezes Cardiovascular: Denies palpitation, chest discomfort or lower extremity swelling Gastrointestinal:  Denies nausea, heartburn or change in bowel habits Skin: Denies abnormal skin rashes Lymphatics: Denies new lymphadenopathy or easy bruising Neurological:Denies numbness, tingling or new weaknesses Behavioral/Psych: Mood is stable, no new changes  All other systems were reviewed with the patient and are negative.  I have reviewed the past medical history, past surgical history, social history and family history with the patient and they are unchanged from previous  note.  ALLERGIES:  is allergic to glucophage [metformin hydrochloride]; zetia [ezetimibe]; fenofibrate; and niacin-lovastatin er.  MEDICATIONS:  Current Outpatient Prescriptions  Medication Sig Dispense Refill  . acetaminophen (TYLENOL) 500 MG tablet Take 1,000 mg by mouth every 6 (six) hours as needed for mild pain, moderate pain or headache.     . clopidogrel (PLAVIX) 75 MG tablet Take 75 mg by mouth daily.    . cyanocobalamin (,VITAMIN B-12,) 1000 MCG/ML injection Inject 1 mL (1,000 mcg total) into the muscle every 30 (thirty) days. 10 mL 3  . Dextromethorphan Polistirex (DELSYM PO) Take 15 mLs by mouth at bedtime.    . furosemide (LASIX) 40 MG tablet TAKE 1 TABLET BY MOUTH DAILY 30 tablet 3  . glucose blood test strip 1 each by Other route daily. Fasting each morning 100 each 3  . hydrocortisone cream 1 % Apply 1 application topically at bedtime as needed for itching.    . Insulin Glargine (LANTUS SOLOSTAR) 100 UNIT/ML Solostar Pen INJECT 25 UNITS EVERY NIGHT AT BEDTIME 5 pen 3  . meclizine (ANTIVERT) 12.5 MG tablet Take 1 tablet (12.5 mg total) by mouth 3 (three) times daily as needed for dizziness. 30 tablet 0  . metoprolol succinate (TOPROL-XL) 100 MG 24 hr tablet TAKE 1 TABLET BY MOUTH ONCE A DAY 90 tablet 3  . Multiple Vitamin (MULTIVITAMIN WITH MINERALS) TABS tablet Take 1 tablet by mouth at bedtime.    Marland Kitchen OVER THE COUNTER MEDICATION Place 1 drop into both eyes daily as needed (dry eyes). Over the counter lubricating eye drop    . SYRINGE-NEEDLE, DISP, 3 ML (LUER LOCK SAFETY SYRINGES) 25G X 1" 3 ML MISC Use with B 12 injections q month 100 each 3  .  tamsulosin (FLOMAX) 0.4 MG CAPS capsule TAKE 1 CAPSULE BY MOUTH ONCE DAILY 90 capsule 3   Current Facility-Administered Medications  Medication Dose Route Frequency Provider Last Rate Last Dose  . cyanocobalamin ((VITAMIN B-12)) injection 1,000 mcg  1,000 mcg Subcutaneous Q30 days [name], MD   1,000 mcg at 07/06/16 1430     PHYSICAL EXAMINATION: ECOG PERFORMANCE STATUS: 1 - Symptomatic but completely ambulatory  Vitals:   09/02/16 1355  BP: (!) 141/62  Pulse: (!) 103  Resp: 18   Filed Weights   09/02/16 1355  Weight: 186 lb 3.2 oz (84.5 kg)    GENERAL:alert, no distress and comfortable SKIN: skin color, texture, turgor are normal, no rashes or significant lesions.  Noted mild bruising EYES: normal, Conjunctiva are pink and non-injected, sclera clear OROPHARYNX:no exudate, no erythema and lips, buccal mucosa, and tongue normal  NECK: supple, thyroid normal size, non-tender, without nodularity LYMPH:  no palpable lymphadenopathy in the cervical, axillary or inguinal LUNGS: Noted basilar crackles in left lung base with normal breathing effort HEART: regular rate & rhythm and no murmurs and no lower extremity edema ABDOMEN:abdomen soft, non-tender and normal bowel sounds Musculoskeletal:no cyanosis of digits and no clubbing  NEURO: alert & oriented x 3 with fluent speech, no focal motor/sensory deficits  LABORATORY DATA:  I have reviewed the data as listed    Component Value Date/Time   NA 139 06/24/2016 1439   NA 138 03/02/2016 1238   K 4.0 06/24/2016 1439   K 4.6 03/02/2016 1238   CL 103 06/24/2016 1439   CL 107 09/27/2012 0940   CO2 24 06/24/2016 1439   CO2 25 03/02/2016 1238   GLUCOSE 235 (H) 06/24/2016 1439   GLUCOSE 163 (H) 03/02/2016 1238   GLUCOSE 89 09/27/2012 0940   BUN 22 06/24/2016 1439   BUN 20.9 03/02/2016 1238   CREATININE 1.54 (H) 06/24/2016 1439   CREATININE 1.8 (H) 03/02/2016 1238   CALCIUM 8.3 (L) 06/24/2016 1439   CALCIUM 8.4 03/02/2016 1238   PROT 6.4 (L) 04/10/2016 0430   PROT 8.2 03/02/2016 1238   ALBUMIN 1.6 (L) 04/10/2016 0430   ALBUMIN 2.6 (L) 03/02/2016 1238   AST 14 (L) 04/10/2016 0430   AST 21 03/02/2016 1238   ALT 13 (L) 04/10/2016 0430   ALT 29 03/02/2016 1238   ALKPHOS 59 04/10/2016 0430   ALKPHOS 84 03/02/2016 1238   BILITOT 0.4 04/10/2016 0430    BILITOT <0.30 03/02/2016 1238   GFRNONAA 41 (L) 06/24/2016 1439   GFRAA 48 (L) 06/24/2016 1439    No results found for: SPEP, UPEP  Lab Results  Component Value Date   WBC 6.2 09/02/2016   NEUTROABS 3.3 09/02/2016   HGB 11.1 (L) 09/02/2016   HCT 36.9 (L) 09/02/2016   MCV 81.1 09/02/2016   PLT 91 Large & giant platelets (L) 09/02/2016      Chemistry      Component Value Date/Time   NA 139 06/24/2016 1439   NA 138 03/02/2016 1238   K 4.0 06/24/2016 1439   K 4.6 03/02/2016 1238   CL 103 06/24/2016 1439   CL 107 09/27/2012 0940   CO2 24 06/24/2016 1439   CO2 25 03/02/2016 1238   BUN 22 06/24/2016 1439   BUN 20.9 03/02/2016 1238   CREATININE 1.54 (H) 06/24/2016 1439   CREATININE 1.8 (H) 03/02/2016 1238      Component Value Date/Time   CALCIUM 8.3 (L) 06/24/2016 1439   CALCIUM 8.4 03/02/2016 1238  ALKPHOS 59 04/10/2016 0430   ALKPHOS 84 03/02/2016 1238   AST 14 (L) 04/10/2016 0430   AST 21 03/02/2016 1238   ALT 13 (L) 04/10/2016 0430   ALT 29 03/02/2016 1238   BILITOT 0.4 04/10/2016 0430   BILITOT <0.30 03/02/2016 1238      ASSESSMENT & PLAN:  MDS (myelodysplastic syndrome) with 5q deletion He has remarkable response to Nplate injection.  He has resolution of neutropenia, improvement of anemia as well as improvement of thrombocytopenia. He will continue vitamin B12 injection along with Nplate injection today. I will see him back in 3 months. The goal of injection is to keep platelet count greater than 50,000 He will get darbepoetin injection to keep hemoglobin greater than 10  Pleural effusion The cause of pleural effusion is unknown. PET/CT scan did not show definitive signs of lymphoma He had recent pleurodesis He is not too symptomatic with shortness of breath Continue close observation   No orders of the defined types were placed in this encounter.  All questions were answered. The patient knows to call the clinic with any problems, questions or  concerns. No barriers to learning was detected. I spent 15 minutes counseling the patient face to face. The total time spent in the appointment was 20 minutes and more than 50% was on counseling and review of test results     Heath Lark, MD 09/03/2016 6:35 AM

## 2016-09-09 ENCOUNTER — Other Ambulatory Visit (HOSPITAL_BASED_OUTPATIENT_CLINIC_OR_DEPARTMENT_OTHER): Payer: Medicare Other

## 2016-09-09 ENCOUNTER — Ambulatory Visit (HOSPITAL_BASED_OUTPATIENT_CLINIC_OR_DEPARTMENT_OTHER): Payer: Medicare Other

## 2016-09-09 VITALS — BP 152/70 | HR 69 | Temp 97.5°F | Resp 20

## 2016-09-09 DIAGNOSIS — N183 Chronic kidney disease, stage 3 unspecified: Secondary | ICD-10-CM

## 2016-09-09 DIAGNOSIS — D631 Anemia in chronic kidney disease: Secondary | ICD-10-CM

## 2016-09-09 DIAGNOSIS — D46C Myelodysplastic syndrome with isolated del(5q) chromosomal abnormality: Secondary | ICD-10-CM

## 2016-09-09 DIAGNOSIS — D696 Thrombocytopenia, unspecified: Secondary | ICD-10-CM

## 2016-09-09 DIAGNOSIS — D693 Immune thrombocytopenic purpura: Secondary | ICD-10-CM

## 2016-09-09 DIAGNOSIS — D539 Nutritional anemia, unspecified: Secondary | ICD-10-CM

## 2016-09-09 LAB — CBC WITH DIFFERENTIAL/PLATELET
BASO%: 0.3 % (ref 0.0–2.0)
Basophils Absolute: 0 10*3/uL (ref 0.0–0.1)
EOS%: 0.9 % (ref 0.0–7.0)
Eosinophils Absolute: 0 10*3/uL (ref 0.0–0.5)
HCT: 36.9 % — ABNORMAL LOW (ref 38.4–49.9)
HGB: 11.1 g/dL — ABNORMAL LOW (ref 13.0–17.1)
LYMPH%: 43.5 % (ref 14.0–49.0)
MCH: 24.4 pg — ABNORMAL LOW (ref 27.2–33.4)
MCHC: 30.1 g/dL — AB (ref 32.0–36.0)
MCV: 81.3 fL (ref 79.3–98.0)
MONO#: 0.4 10*3/uL (ref 0.1–0.9)
MONO%: 13 % (ref 0.0–14.0)
NEUT%: 42.3 % (ref 39.0–75.0)
NEUTROS ABS: 1.4 10*3/uL — AB (ref 1.5–6.5)
Platelets: 80 10*3/uL — ABNORMAL LOW (ref 140–400)
RBC: 4.54 10*6/uL (ref 4.20–5.82)
RDW: 23 % — AB (ref 11.0–14.6)
WBC: 3.4 10*3/uL — AB (ref 4.0–10.3)
lymph#: 1.5 10*3/uL (ref 0.9–3.3)
nRBC: 0 % (ref 0–0)

## 2016-09-09 LAB — TECHNOLOGIST REVIEW

## 2016-09-09 MED ORDER — ROMIPLOSTIM 250 MCG ~~LOC~~ SOLR
2.0000 ug/kg | SUBCUTANEOUS | Status: DC
  Administered 2016-09-09: 170 ug via SUBCUTANEOUS
  Filled 2016-09-09: qty 0.34

## 2016-09-09 NOTE — Patient Instructions (Signed)
Romiplostim injection What is this medicine? ROMIPLOSTIM (roe mi PLOE stim) helps your body make more platelets. This medicine is used to treat low platelets caused by chronic idiopathic thrombocytopenic purpura (ITP). This medicine may be used for other purposes; ask your health care provider or pharmacist if you have questions. COMMON BRAND NAME(S): Nplate What should I tell my health care provider before I take this medicine? They need to know if you have any of these conditions: -cancer or myelodysplastic syndrome -low blood counts, like low white cell, platelet, or red cell counts -take medicines that treat or prevent blood clots -an unusual or allergic reaction to romiplostim, mannitol, other medicines, foods, dyes, or preservatives -pregnant or trying to get pregnant -breast-feeding How should I use this medicine? This medicine is for injection under the skin. It is given by a health care professional in a hospital or clinic setting. A special MedGuide will be given to you before your injection. Read this information carefully each time. Talk to your pediatrician regarding the use of this medicine in children. Special care may be needed. Overdosage: If you think you have taken too much of this medicine contact a poison control center or emergency room at once. NOTE: This medicine is only for you. Do not share this medicine with others. What if I miss a dose? It is important not to miss your dose. Call your doctor or health care professional if you are unable to keep an appointment. What may interact with this medicine? Interactions are not expected. This list may not describe all possible interactions. Give your health care provider a list of all the medicines, herbs, non-prescription drugs, or dietary supplements you use. Also tell them if you smoke, drink alcohol, or use illegal drugs. Some items may interact with your medicine. What should I watch for while using this  medicine? Your condition will be monitored carefully while you are receiving this medicine. Visit your prescriber or health care professional for regular checks on your progress and for the needed blood tests. It is important to keep all appointments. What side effects may I notice from receiving this medicine? Side effects that you should report to your doctor or health care professional as soon as possible: -allergic reactions like skin rash, itching or hives, swelling of the face, lips, or tongue -shortness of breath, chest pain, swelling in a leg -unusual bleeding or bruising Side effects that usually do not require medical attention (report to your doctor or health care professional if they continue or are bothersome): -dizziness -headache -muscle aches -pain in arms and legs -stomach pain -trouble sleeping This list may not describe all possible side effects. Call your doctor for medical advice about side effects. You may report side effects to FDA at 1-800-FDA-1088. Where should I keep my medicine? This drug is given in a hospital or clinic and will not be stored at home. NOTE: This sheet is a summary. It may not cover all possible information. If you have questions about this medicine, talk to your doctor, pharmacist, or health care provider.  2018 Elsevier/Gold Standard (2008-02-05 15:13:04)  

## 2016-09-16 ENCOUNTER — Other Ambulatory Visit (HOSPITAL_BASED_OUTPATIENT_CLINIC_OR_DEPARTMENT_OTHER): Payer: Medicare Other

## 2016-09-16 ENCOUNTER — Ambulatory Visit (HOSPITAL_BASED_OUTPATIENT_CLINIC_OR_DEPARTMENT_OTHER): Payer: Medicare Other

## 2016-09-16 VITALS — BP 136/66 | HR 69 | Temp 97.9°F | Resp 18

## 2016-09-16 DIAGNOSIS — N183 Chronic kidney disease, stage 3 unspecified: Secondary | ICD-10-CM

## 2016-09-16 DIAGNOSIS — D693 Immune thrombocytopenic purpura: Secondary | ICD-10-CM | POA: Diagnosis not present

## 2016-09-16 DIAGNOSIS — D46C Myelodysplastic syndrome with isolated del(5q) chromosomal abnormality: Secondary | ICD-10-CM

## 2016-09-16 DIAGNOSIS — D696 Thrombocytopenia, unspecified: Secondary | ICD-10-CM

## 2016-09-16 DIAGNOSIS — D539 Nutritional anemia, unspecified: Secondary | ICD-10-CM

## 2016-09-16 DIAGNOSIS — D631 Anemia in chronic kidney disease: Secondary | ICD-10-CM

## 2016-09-16 LAB — CBC WITH DIFFERENTIAL/PLATELET
BASO%: 0.3 % (ref 0.0–2.0)
Basophils Absolute: 0 10*3/uL (ref 0.0–0.1)
EOS ABS: 0 10*3/uL (ref 0.0–0.5)
EOS%: 0.8 % (ref 0.0–7.0)
HEMATOCRIT: 35.2 % — AB (ref 38.4–49.9)
HGB: 10.6 g/dL — ABNORMAL LOW (ref 13.0–17.1)
LYMPH#: 1.6 10*3/uL (ref 0.9–3.3)
LYMPH%: 40.5 % (ref 14.0–49.0)
MCH: 24.2 pg — ABNORMAL LOW (ref 27.2–33.4)
MCHC: 30.1 g/dL — ABNORMAL LOW (ref 32.0–36.0)
MCV: 80.4 fL (ref 79.3–98.0)
MONO#: 0.5 10*3/uL (ref 0.1–0.9)
MONO%: 13.3 % (ref 0.0–14.0)
NEUT%: 45.1 % (ref 39.0–75.0)
NEUTROS ABS: 1.8 10*3/uL (ref 1.5–6.5)
NRBC: 0 % (ref 0–0)
PLATELETS: 80 10*3/uL — AB (ref 140–400)
RBC: 4.38 10*6/uL (ref 4.20–5.82)
RDW: 23.4 % — AB (ref 11.0–14.6)
WBC: 4 10*3/uL (ref 4.0–10.3)

## 2016-09-16 LAB — TECHNOLOGIST REVIEW

## 2016-09-16 MED ORDER — ROMIPLOSTIM 250 MCG ~~LOC~~ SOLR
170.0000 ug | SUBCUTANEOUS | Status: DC
  Administered 2016-09-16: 170 ug via SUBCUTANEOUS
  Filled 2016-09-16: qty 0.34

## 2016-09-16 NOTE — Patient Instructions (Signed)
Romiplostim injection What is this medicine? ROMIPLOSTIM (roe mi PLOE stim) helps your body make more platelets. This medicine is used to treat low platelets caused by chronic idiopathic thrombocytopenic purpura (ITP). This medicine may be used for other purposes; ask your health care provider or pharmacist if you have questions. COMMON BRAND NAME(S): Nplate What should I tell my health care provider before I take this medicine? They need to know if you have any of these conditions: -cancer or myelodysplastic syndrome -low blood counts, like low white cell, platelet, or red cell counts -take medicines that treat or prevent blood clots -an unusual or allergic reaction to romiplostim, mannitol, other medicines, foods, dyes, or preservatives -pregnant or trying to get pregnant -breast-feeding How should I use this medicine? This medicine is for injection under the skin. It is given by a health care professional in a hospital or clinic setting. A special MedGuide will be given to you before your injection. Read this information carefully each time. Talk to your pediatrician regarding the use of this medicine in children. Special care may be needed. Overdosage: If you think you have taken too much of this medicine contact a poison control center or emergency room at once. NOTE: This medicine is only for you. Do not share this medicine with others. What if I miss a dose? It is important not to miss your dose. Call your doctor or health care professional if you are unable to keep an appointment. What may interact with this medicine? Interactions are not expected. This list may not describe all possible interactions. Give your health care provider a list of all the medicines, herbs, non-prescription drugs, or dietary supplements you use. Also tell them if you smoke, drink alcohol, or use illegal drugs. Some items may interact with your medicine. What should I watch for while using this  medicine? Your condition will be monitored carefully while you are receiving this medicine. Visit your prescriber or health care professional for regular checks on your progress and for the needed blood tests. It is important to keep all appointments. What side effects may I notice from receiving this medicine? Side effects that you should report to your doctor or health care professional as soon as possible: -allergic reactions like skin rash, itching or hives, swelling of the face, lips, or tongue -shortness of breath, chest pain, swelling in a leg -unusual bleeding or bruising Side effects that usually do not require medical attention (report to your doctor or health care professional if they continue or are bothersome): -dizziness -headache -muscle aches -pain in arms and legs -stomach pain -trouble sleeping This list may not describe all possible side effects. Call your doctor for medical advice about side effects. You may report side effects to FDA at 1-800-FDA-1088. Where should I keep my medicine? This drug is given in a hospital or clinic and will not be stored at home. NOTE: This sheet is a summary. It may not cover all possible information. If you have questions about this medicine, talk to your doctor, pharmacist, or health care provider.  2018 Elsevier/Gold Standard (2008-02-05 15:13:04)  

## 2016-09-23 ENCOUNTER — Other Ambulatory Visit (HOSPITAL_BASED_OUTPATIENT_CLINIC_OR_DEPARTMENT_OTHER): Payer: Medicare Other

## 2016-09-23 ENCOUNTER — Ambulatory Visit (HOSPITAL_BASED_OUTPATIENT_CLINIC_OR_DEPARTMENT_OTHER): Payer: Medicare Other

## 2016-09-23 VITALS — HR 68 | Temp 97.6°F | Resp 18

## 2016-09-23 DIAGNOSIS — D693 Immune thrombocytopenic purpura: Secondary | ICD-10-CM | POA: Diagnosis not present

## 2016-09-23 DIAGNOSIS — N183 Chronic kidney disease, stage 3 unspecified: Secondary | ICD-10-CM

## 2016-09-23 DIAGNOSIS — D46C Myelodysplastic syndrome with isolated del(5q) chromosomal abnormality: Secondary | ICD-10-CM

## 2016-09-23 DIAGNOSIS — D539 Nutritional anemia, unspecified: Secondary | ICD-10-CM

## 2016-09-23 DIAGNOSIS — D696 Thrombocytopenia, unspecified: Secondary | ICD-10-CM

## 2016-09-23 DIAGNOSIS — D631 Anemia in chronic kidney disease: Secondary | ICD-10-CM

## 2016-09-23 LAB — CBC WITH DIFFERENTIAL/PLATELET
BASO%: 0 % (ref 0.0–2.0)
Basophils Absolute: 0 10*3/uL (ref 0.0–0.1)
EOS ABS: 0 10*3/uL (ref 0.0–0.5)
EOS%: 0.4 % (ref 0.0–7.0)
HEMATOCRIT: 36.1 % — AB (ref 38.4–49.9)
HGB: 10.9 g/dL — ABNORMAL LOW (ref 13.0–17.1)
LYMPH%: 60.6 % — AB (ref 14.0–49.0)
MCH: 24.2 pg — ABNORMAL LOW (ref 27.2–33.4)
MCHC: 30.2 g/dL — AB (ref 32.0–36.0)
MCV: 80.2 fL (ref 79.3–98.0)
MONO#: 0.3 10*3/uL (ref 0.1–0.9)
MONO%: 13.1 % (ref 0.0–14.0)
NEUT%: 25.9 % — ABNORMAL LOW (ref 39.0–75.0)
NEUTROS ABS: 0.7 10*3/uL — AB (ref 1.5–6.5)
PLATELETS: 80 10*3/uL — AB (ref 140–400)
RBC: 4.5 10*6/uL (ref 4.20–5.82)
RDW: 23.3 % — ABNORMAL HIGH (ref 11.0–14.6)
WBC: 2.5 10*3/uL — AB (ref 4.0–10.3)
lymph#: 1.5 10*3/uL (ref 0.9–3.3)
nRBC: 0 % (ref 0–0)

## 2016-09-23 MED ORDER — ROMIPLOSTIM 250 MCG ~~LOC~~ SOLR
170.0000 ug | SUBCUTANEOUS | Status: DC
  Administered 2016-09-23: 170 ug via SUBCUTANEOUS
  Filled 2016-09-23: qty 0.34

## 2016-09-23 NOTE — Patient Instructions (Signed)
Romiplostim injection What is this medicine? ROMIPLOSTIM (roe mi PLOE stim) helps your body make more platelets. This medicine is used to treat low platelets caused by chronic idiopathic thrombocytopenic purpura (ITP). This medicine may be used for other purposes; ask your health care provider or pharmacist if you have questions. COMMON BRAND NAME(S): Nplate What should I tell my health care provider before I take this medicine? They need to know if you have any of these conditions: -cancer or myelodysplastic syndrome -low blood counts, like low white cell, platelet, or red cell counts -take medicines that treat or prevent blood clots -an unusual or allergic reaction to romiplostim, mannitol, other medicines, foods, dyes, or preservatives -pregnant or trying to get pregnant -breast-feeding How should I use this medicine? This medicine is for injection under the skin. It is given by a health care professional in a hospital or clinic setting. A special MedGuide will be given to you before your injection. Read this information carefully each time. Talk to your pediatrician regarding the use of this medicine in children. Special care may be needed. Overdosage: If you think you have taken too much of this medicine contact a poison control center or emergency room at once. NOTE: This medicine is only for you. Do not share this medicine with others. What if I miss a dose? It is important not to miss your dose. Call your doctor or health care professional if you are unable to keep an appointment. What may interact with this medicine? Interactions are not expected. This list may not describe all possible interactions. Give your health care provider a list of all the medicines, herbs, non-prescription drugs, or dietary supplements you use. Also tell them if you smoke, drink alcohol, or use illegal drugs. Some items may interact with your medicine. What should I watch for while using this  medicine? Your condition will be monitored carefully while you are receiving this medicine. Visit your prescriber or health care professional for regular checks on your progress and for the needed blood tests. It is important to keep all appointments. What side effects may I notice from receiving this medicine? Side effects that you should report to your doctor or health care professional as soon as possible: -allergic reactions like skin rash, itching or hives, swelling of the face, lips, or tongue -shortness of breath, chest pain, swelling in a leg -unusual bleeding or bruising Side effects that usually do not require medical attention (report to your doctor or health care professional if they continue or are bothersome): -dizziness -headache -muscle aches -pain in arms and legs -stomach pain -trouble sleeping This list may not describe all possible side effects. Call your doctor for medical advice about side effects. You may report side effects to FDA at 1-800-FDA-1088. Where should I keep my medicine? This drug is given in a hospital or clinic and will not be stored at home. NOTE: This sheet is a summary. It may not cover all possible information. If you have questions about this medicine, talk to your doctor, pharmacist, or health care provider.  2018 Elsevier/Gold Standard (2008-02-05 15:13:04)  

## 2016-09-30 ENCOUNTER — Ambulatory Visit (HOSPITAL_BASED_OUTPATIENT_CLINIC_OR_DEPARTMENT_OTHER): Payer: Medicare Other

## 2016-09-30 ENCOUNTER — Other Ambulatory Visit (HOSPITAL_BASED_OUTPATIENT_CLINIC_OR_DEPARTMENT_OTHER): Payer: Medicare Other

## 2016-09-30 DIAGNOSIS — N183 Chronic kidney disease, stage 3 unspecified: Secondary | ICD-10-CM

## 2016-09-30 DIAGNOSIS — D46C Myelodysplastic syndrome with isolated del(5q) chromosomal abnormality: Secondary | ICD-10-CM | POA: Diagnosis not present

## 2016-09-30 DIAGNOSIS — D693 Immune thrombocytopenic purpura: Secondary | ICD-10-CM

## 2016-09-30 DIAGNOSIS — D631 Anemia in chronic kidney disease: Secondary | ICD-10-CM

## 2016-09-30 DIAGNOSIS — D539 Nutritional anemia, unspecified: Secondary | ICD-10-CM

## 2016-09-30 DIAGNOSIS — D696 Thrombocytopenia, unspecified: Secondary | ICD-10-CM

## 2016-09-30 LAB — CBC WITH DIFFERENTIAL/PLATELET
BASO%: 0 % (ref 0.0–2.0)
BASOS ABS: 0 10*3/uL (ref 0.0–0.1)
EOS ABS: 0 10*3/uL (ref 0.0–0.5)
EOS%: 0.7 % (ref 0.0–7.0)
HCT: 35.3 % — ABNORMAL LOW (ref 38.4–49.9)
HEMOGLOBIN: 10.7 g/dL — AB (ref 13.0–17.1)
LYMPH#: 1.7 10*3/uL (ref 0.9–3.3)
LYMPH%: 61.1 % — ABNORMAL HIGH (ref 14.0–49.0)
MCH: 24.2 pg — ABNORMAL LOW (ref 27.2–33.4)
MCHC: 30.3 g/dL — ABNORMAL LOW (ref 32.0–36.0)
MCV: 79.7 fL (ref 79.3–98.0)
MONO#: 0.3 10*3/uL (ref 0.1–0.9)
MONO%: 11.9 % (ref 0.0–14.0)
NEUT%: 26.3 % — ABNORMAL LOW (ref 39.0–75.0)
NEUTROS ABS: 0.8 10*3/uL — AB (ref 1.5–6.5)
NRBC: 1 % — AB (ref 0–0)
PLATELETS: 60 10*3/uL — AB (ref 140–400)
RBC: 4.43 10*6/uL (ref 4.20–5.82)
RDW: 23.2 % — AB (ref 11.0–14.6)
WBC: 2.9 10*3/uL — AB (ref 4.0–10.3)

## 2016-09-30 LAB — TECHNOLOGIST REVIEW

## 2016-09-30 MED ORDER — ROMIPLOSTIM INJECTION 500 MCG
170.0000 ug | SUBCUTANEOUS | Status: DC
  Filled 2016-09-30: qty 0.34

## 2016-09-30 MED ORDER — CYANOCOBALAMIN 1000 MCG/ML IJ SOLN
1000.0000 ug | Freq: Once | INTRAMUSCULAR | Status: AC
  Administered 2016-09-30: 1000 ug via INTRAMUSCULAR

## 2016-09-30 MED ORDER — ROMIPLOSTIM 250 MCG ~~LOC~~ SOLR
2.0000 ug/kg | Freq: Once | SUBCUTANEOUS | Status: AC
  Administered 2016-09-30: 170 ug via SUBCUTANEOUS
  Filled 2016-09-30: qty 0.34

## 2016-09-30 NOTE — Patient Instructions (Signed)
Romiplostim injection What is this medicine? ROMIPLOSTIM (roe mi PLOE stim) helps your body make more platelets. This medicine is used to treat low platelets caused by chronic idiopathic thrombocytopenic purpura (ITP). This medicine may be used for other purposes; ask your health care provider or pharmacist if you have questions. COMMON BRAND NAME(S): Nplate What should I tell my health care provider before I take this medicine? They need to know if you have any of these conditions: -cancer or myelodysplastic syndrome -low blood counts, like low white cell, platelet, or red cell counts -take medicines that treat or prevent blood clots -an unusual or allergic reaction to romiplostim, mannitol, other medicines, foods, dyes, or preservatives -pregnant or trying to get pregnant -breast-feeding How should I use this medicine? This medicine is for injection under the skin. It is given by a health care professional in a hospital or clinic setting. A special MedGuide will be given to you before your injection. Read this information carefully each time. Talk to your pediatrician regarding the use of this medicine in children. Special care may be needed. Overdosage: If you think you have taken too much of this medicine contact a poison control center or emergency room at once. NOTE: This medicine is only for you. Do not share this medicine with others. What if I miss a dose? It is important not to miss your dose. Call your doctor or health care professional if you are unable to keep an appointment. What may interact with this medicine? Interactions are not expected. This list may not describe all possible interactions. Give your health care provider a list of all the medicines, herbs, non-prescription drugs, or dietary supplements you use. Also tell them if you smoke, drink alcohol, or use illegal drugs. Some items may interact with your medicine. What should I watch for while using this  medicine? Your condition will be monitored carefully while you are receiving this medicine. Visit your prescriber or health care professional for regular checks on your progress and for the needed blood tests. It is important to keep all appointments. What side effects may I notice from receiving this medicine? Side effects that you should report to your doctor or health care professional as soon as possible: -allergic reactions like skin rash, itching or hives, swelling of the face, lips, or tongue -shortness of breath, chest pain, swelling in a leg -unusual bleeding or bruising Side effects that usually do not require medical attention (report to your doctor or health care professional if they continue or are bothersome): -dizziness -headache -muscle aches -pain in arms and legs -stomach pain -trouble sleeping This list may not describe all possible side effects. Call your doctor for medical advice about side effects. You may report side effects to FDA at 1-800-FDA-1088. Where should I keep my medicine? This drug is given in a hospital or clinic and will not be stored at home. NOTE: This sheet is a summary. It may not cover all possible information. If you have questions about this medicine, talk to your doctor, pharmacist, or health care provider.  2018 Elsevier/Gold Standard (2008-02-05 15:13:04)  

## 2016-10-07 ENCOUNTER — Other Ambulatory Visit (HOSPITAL_BASED_OUTPATIENT_CLINIC_OR_DEPARTMENT_OTHER): Payer: Medicare Other

## 2016-10-07 ENCOUNTER — Ambulatory Visit (HOSPITAL_BASED_OUTPATIENT_CLINIC_OR_DEPARTMENT_OTHER): Payer: Medicare Other

## 2016-10-07 VITALS — BP 160/76 | HR 72 | Temp 97.5°F | Resp 20

## 2016-10-07 DIAGNOSIS — D693 Immune thrombocytopenic purpura: Secondary | ICD-10-CM

## 2016-10-07 DIAGNOSIS — D46C Myelodysplastic syndrome with isolated del(5q) chromosomal abnormality: Secondary | ICD-10-CM

## 2016-10-07 DIAGNOSIS — N183 Chronic kidney disease, stage 3 unspecified: Secondary | ICD-10-CM

## 2016-10-07 DIAGNOSIS — D539 Nutritional anemia, unspecified: Secondary | ICD-10-CM

## 2016-10-07 DIAGNOSIS — D696 Thrombocytopenia, unspecified: Secondary | ICD-10-CM

## 2016-10-07 DIAGNOSIS — D631 Anemia in chronic kidney disease: Secondary | ICD-10-CM

## 2016-10-07 LAB — CBC WITH DIFFERENTIAL/PLATELET
BASO%: 0.3 % (ref 0.0–2.0)
BASOS ABS: 0 10*3/uL (ref 0.0–0.1)
EOS%: 0.7 % (ref 0.0–7.0)
Eosinophils Absolute: 0 10*3/uL (ref 0.0–0.5)
HEMATOCRIT: 35.7 % — AB (ref 38.4–49.9)
HGB: 11 g/dL — ABNORMAL LOW (ref 13.0–17.1)
LYMPH%: 60.8 % — ABNORMAL HIGH (ref 14.0–49.0)
MCH: 24.3 pg — AB (ref 27.2–33.4)
MCHC: 30.8 g/dL — ABNORMAL LOW (ref 32.0–36.0)
MCV: 79 fL — ABNORMAL LOW (ref 79.3–98.0)
MONO#: 0.4 10*3/uL (ref 0.1–0.9)
MONO%: 12.1 % (ref 0.0–14.0)
NEUT#: 0.8 10*3/uL — ABNORMAL LOW (ref 1.5–6.5)
NEUT%: 26.1 % — AB (ref 39.0–75.0)
NRBC: 0 % (ref 0–0)
Platelets: 56 10*3/uL — ABNORMAL LOW (ref 140–400)
RBC: 4.52 10*6/uL (ref 4.20–5.82)
RDW: 23.5 % — AB (ref 11.0–14.6)
WBC: 3.1 10*3/uL — ABNORMAL LOW (ref 4.0–10.3)
lymph#: 1.9 10*3/uL (ref 0.9–3.3)

## 2016-10-07 LAB — TECHNOLOGIST REVIEW

## 2016-10-07 MED ORDER — ROMIPLOSTIM 250 MCG ~~LOC~~ SOLR
170.0000 ug | SUBCUTANEOUS | Status: DC
  Administered 2016-10-07: 170 ug via SUBCUTANEOUS
  Filled 2016-10-07: qty 0.34

## 2016-10-07 NOTE — Patient Instructions (Signed)
Romiplostim injection What is this medicine? ROMIPLOSTIM (roe mi PLOE stim) helps your body make more platelets. This medicine is used to treat low platelets caused by chronic idiopathic thrombocytopenic purpura (ITP). This medicine may be used for other purposes; ask your health care provider or pharmacist if you have questions. COMMON BRAND NAME(S): Nplate What should I tell my health care provider before I take this medicine? They need to know if you have any of these conditions: -cancer or myelodysplastic syndrome -low blood counts, like low white cell, platelet, or red cell counts -take medicines that treat or prevent blood clots -an unusual or allergic reaction to romiplostim, mannitol, other medicines, foods, dyes, or preservatives -pregnant or trying to get pregnant -breast-feeding How should I use this medicine? This medicine is for injection under the skin. It is given by a health care professional in a hospital or clinic setting. A special MedGuide will be given to you before your injection. Read this information carefully each time. Talk to your pediatrician regarding the use of this medicine in children. Special care may be needed. Overdosage: If you think you have taken too much of this medicine contact a poison control center or emergency room at once. NOTE: This medicine is only for you. Do not share this medicine with others. What if I miss a dose? It is important not to miss your dose. Call your doctor or health care professional if you are unable to keep an appointment. What may interact with this medicine? Interactions are not expected. This list may not describe all possible interactions. Give your health care provider a list of all the medicines, herbs, non-prescription drugs, or dietary supplements you use. Also tell them if you smoke, drink alcohol, or use illegal drugs. Some items may interact with your medicine. What should I watch for while using this  medicine? Your condition will be monitored carefully while you are receiving this medicine. Visit your prescriber or health care professional for regular checks on your progress and for the needed blood tests. It is important to keep all appointments. What side effects may I notice from receiving this medicine? Side effects that you should report to your doctor or health care professional as soon as possible: -allergic reactions like skin rash, itching or hives, swelling of the face, lips, or tongue -shortness of breath, chest pain, swelling in a leg -unusual bleeding or bruising Side effects that usually do not require medical attention (report to your doctor or health care professional if they continue or are bothersome): -dizziness -headache -muscle aches -pain in arms and legs -stomach pain -trouble sleeping This list may not describe all possible side effects. Call your doctor for medical advice about side effects. You may report side effects to FDA at 1-800-FDA-1088. Where should I keep my medicine? This drug is given in a hospital or clinic and will not be stored at home. NOTE: This sheet is a summary. It may not cover all possible information. If you have questions about this medicine, talk to your doctor, pharmacist, or health care provider.  2018 Elsevier/Gold Standard (2008-02-05 15:13:04)  

## 2016-10-12 ENCOUNTER — Other Ambulatory Visit: Payer: Self-pay | Admitting: Family Medicine

## 2016-10-13 ENCOUNTER — Encounter: Payer: Self-pay | Admitting: Podiatry

## 2016-10-13 ENCOUNTER — Ambulatory Visit (INDEPENDENT_AMBULATORY_CARE_PROVIDER_SITE_OTHER): Payer: Medicare Other | Admitting: Podiatry

## 2016-10-13 DIAGNOSIS — B351 Tinea unguium: Secondary | ICD-10-CM

## 2016-10-13 DIAGNOSIS — M79676 Pain in unspecified toe(s): Secondary | ICD-10-CM | POA: Diagnosis not present

## 2016-10-13 DIAGNOSIS — I739 Peripheral vascular disease, unspecified: Secondary | ICD-10-CM

## 2016-10-13 NOTE — Progress Notes (Signed)
Patient ID: Ricardo Watkins, male   DOB: June 15, 1931, 83 y.o.   MRN: [id number]    Subjective: This patient presents for scheduled visit and request debridement of painful toenails which are uncomfortable when walking wearing shoes.The patient's wife is present in the treatment room today  Objective: Orientated 3 Peripheral pitting edema bilaterally DP pulses 0/4 bilaterally PT pulses 1/4 bilaterally Sensation to 10 g monofilament wire intact 5/5 bilaterally Vibratory sensation reactive bilaterally Ankle reflex equal and reactive bilaterally No deformities bilaterally Manual motor testing dorsi flexion, plantar flexion 5/5 bilaterally No open skin lesions bilaterally Atrophic skin with absent hair growth bilaterally The toenails are hypertrophic, elongated, incurvated, discolored and tender direct palpation 6-10 Patient walks slowly with assistance of cane  Assessment: Diabetic with peripheral arterial disease Symptomatic onychomycoses 6-10  Plan: Debridement toenails 10 and mechanically and electrically without any bleeding   Reappoint 3 months

## 2016-10-13 NOTE — Patient Instructions (Signed)

## 2016-10-14 ENCOUNTER — Other Ambulatory Visit (HOSPITAL_BASED_OUTPATIENT_CLINIC_OR_DEPARTMENT_OTHER): Payer: Medicare Other

## 2016-10-14 ENCOUNTER — Ambulatory Visit (HOSPITAL_BASED_OUTPATIENT_CLINIC_OR_DEPARTMENT_OTHER): Payer: Medicare Other

## 2016-10-14 VITALS — BP 165/72 | HR 82 | Temp 97.8°F

## 2016-10-14 DIAGNOSIS — D631 Anemia in chronic kidney disease: Secondary | ICD-10-CM

## 2016-10-14 DIAGNOSIS — N183 Chronic kidney disease, stage 3 unspecified: Secondary | ICD-10-CM

## 2016-10-14 DIAGNOSIS — D696 Thrombocytopenia, unspecified: Secondary | ICD-10-CM

## 2016-10-14 DIAGNOSIS — D46C Myelodysplastic syndrome with isolated del(5q) chromosomal abnormality: Secondary | ICD-10-CM

## 2016-10-14 DIAGNOSIS — D693 Immune thrombocytopenic purpura: Secondary | ICD-10-CM | POA: Diagnosis present

## 2016-10-14 DIAGNOSIS — D539 Nutritional anemia, unspecified: Secondary | ICD-10-CM

## 2016-10-14 LAB — CBC WITH DIFFERENTIAL/PLATELET
BASO%: 0.3 % (ref 0.0–2.0)
Basophils Absolute: 0 10*3/uL (ref 0.0–0.1)
EOS ABS: 0 10*3/uL (ref 0.0–0.5)
EOS%: 1 % (ref 0.0–7.0)
HCT: 35.8 % — ABNORMAL LOW (ref 38.4–49.9)
HEMOGLOBIN: 11 g/dL — AB (ref 13.0–17.1)
LYMPH#: 2 10*3/uL (ref 0.9–3.3)
LYMPH%: 51.9 % — AB (ref 14.0–49.0)
MCH: 24.1 pg — AB (ref 27.2–33.4)
MCHC: 30.7 g/dL — AB (ref 32.0–36.0)
MCV: 78.5 fL — ABNORMAL LOW (ref 79.3–98.0)
MONO#: 0.5 10*3/uL (ref 0.1–0.9)
MONO%: 12.5 % (ref 0.0–14.0)
NEUT#: 1.3 10*3/uL — ABNORMAL LOW (ref 1.5–6.5)
NEUT%: 34.3 % — AB (ref 39.0–75.0)
RBC: 4.56 10*6/uL (ref 4.20–5.82)
RDW: 23.9 % — ABNORMAL HIGH (ref 11.0–14.6)
WBC: 3.9 10*3/uL — ABNORMAL LOW (ref 4.0–10.3)
nRBC: 0 % (ref 0–0)

## 2016-10-14 LAB — TECHNOLOGIST REVIEW

## 2016-10-14 MED ORDER — ROMIPLOSTIM 250 MCG ~~LOC~~ SOLR
170.0000 ug | SUBCUTANEOUS | Status: DC
  Administered 2016-10-14: 170 ug via SUBCUTANEOUS
  Filled 2016-10-14: qty 0.34

## 2016-10-14 NOTE — Patient Instructions (Signed)
Romiplostim injection What is this medicine? ROMIPLOSTIM (roe mi PLOE stim) helps your body make more platelets. This medicine is used to treat low platelets caused by chronic idiopathic thrombocytopenic purpura (ITP). This medicine may be used for other purposes; ask your health care provider or pharmacist if you have questions. COMMON BRAND NAME(S): Nplate What should I tell my health care provider before I take this medicine? They need to know if you have any of these conditions: -cancer or myelodysplastic syndrome -low blood counts, like low white cell, platelet, or red cell counts -take medicines that treat or prevent blood clots -an unusual or allergic reaction to romiplostim, mannitol, other medicines, foods, dyes, or preservatives -pregnant or trying to get pregnant -breast-feeding How should I use this medicine? This medicine is for injection under the skin. It is given by a health care professional in a hospital or clinic setting. A special MedGuide will be given to you before your injection. Read this information carefully each time. Talk to your pediatrician regarding the use of this medicine in children. Special care may be needed. Overdosage: If you think you have taken too much of this medicine contact a poison control center or emergency room at once. NOTE: This medicine is only for you. Do not share this medicine with others. What if I miss a dose? It is important not to miss your dose. Call your doctor or health care professional if you are unable to keep an appointment. What may interact with this medicine? Interactions are not expected. This list may not describe all possible interactions. Give your health care provider a list of all the medicines, herbs, non-prescription drugs, or dietary supplements you use. Also tell them if you smoke, drink alcohol, or use illegal drugs. Some items may interact with your medicine. What should I watch for while using this  medicine? Your condition will be monitored carefully while you are receiving this medicine. Visit your prescriber or health care professional for regular checks on your progress and for the needed blood tests. It is important to keep all appointments. What side effects may I notice from receiving this medicine? Side effects that you should report to your doctor or health care professional as soon as possible: -allergic reactions like skin rash, itching or hives, swelling of the face, lips, or tongue -shortness of breath, chest pain, swelling in a leg -unusual bleeding or bruising Side effects that usually do not require medical attention (report to your doctor or health care professional if they continue or are bothersome): -dizziness -headache -muscle aches -pain in arms and legs -stomach pain -trouble sleeping This list may not describe all possible side effects. Call your doctor for medical advice about side effects. You may report side effects to FDA at 1-800-FDA-1088. Where should I keep my medicine? This drug is given in a hospital or clinic and will not be stored at home. NOTE: This sheet is a summary. It may not cover all possible information. If you have questions about this medicine, talk to your doctor, pharmacist, or health care provider.  2018 Elsevier/Gold Standard (2008-02-05 15:13:04)  

## 2016-10-21 ENCOUNTER — Ambulatory Visit (HOSPITAL_BASED_OUTPATIENT_CLINIC_OR_DEPARTMENT_OTHER): Payer: Medicare Other

## 2016-10-21 ENCOUNTER — Other Ambulatory Visit (HOSPITAL_BASED_OUTPATIENT_CLINIC_OR_DEPARTMENT_OTHER): Payer: Medicare Other

## 2016-10-21 VITALS — BP 147/80 | HR 78 | Temp 97.7°F | Resp 18

## 2016-10-21 DIAGNOSIS — D631 Anemia in chronic kidney disease: Secondary | ICD-10-CM

## 2016-10-21 DIAGNOSIS — D696 Thrombocytopenia, unspecified: Secondary | ICD-10-CM

## 2016-10-21 DIAGNOSIS — N183 Chronic kidney disease, stage 3 unspecified: Secondary | ICD-10-CM

## 2016-10-21 DIAGNOSIS — D539 Nutritional anemia, unspecified: Secondary | ICD-10-CM

## 2016-10-21 DIAGNOSIS — D693 Immune thrombocytopenic purpura: Secondary | ICD-10-CM

## 2016-10-21 DIAGNOSIS — D46C Myelodysplastic syndrome with isolated del(5q) chromosomal abnormality: Secondary | ICD-10-CM

## 2016-10-21 LAB — CBC WITH DIFFERENTIAL/PLATELET
BASO%: 0.2 % (ref 0.0–2.0)
Basophils Absolute: 0 10*3/uL (ref 0.0–0.1)
EOS%: 0.7 % (ref 0.0–7.0)
Eosinophils Absolute: 0 10*3/uL (ref 0.0–0.5)
HEMATOCRIT: 36.2 % — AB (ref 38.4–49.9)
HEMOGLOBIN: 10.9 g/dL — AB (ref 13.0–17.1)
LYMPH#: 1.5 10*3/uL (ref 0.9–3.3)
LYMPH%: 35.3 % (ref 14.0–49.0)
MCH: 24 pg — AB (ref 27.2–33.4)
MCHC: 30.1 g/dL — ABNORMAL LOW (ref 32.0–36.0)
MCV: 79.6 fL (ref 79.3–98.0)
MONO#: 0.7 10*3/uL (ref 0.1–0.9)
MONO%: 15.8 % — ABNORMAL HIGH (ref 0.0–14.0)
NEUT#: 2 10*3/uL (ref 1.5–6.5)
NEUT%: 48 % (ref 39.0–75.0)
NRBC: 1 % — AB (ref 0–0)
Platelets: 68 10*3/uL — ABNORMAL LOW (ref 140–400)
RBC: 4.55 10*6/uL (ref 4.20–5.82)
RDW: 23.8 % — AB (ref 11.0–14.6)
WBC: 4.2 10*3/uL (ref 4.0–10.3)

## 2016-10-21 LAB — TECHNOLOGIST REVIEW

## 2016-10-21 MED ORDER — ROMIPLOSTIM 250 MCG ~~LOC~~ SOLR
170.0000 ug | SUBCUTANEOUS | Status: DC
  Administered 2016-10-21: 170 ug via SUBCUTANEOUS
  Filled 2016-10-21: qty 0.34

## 2016-10-21 NOTE — Patient Instructions (Signed)
Romiplostim injection What is this medicine? ROMIPLOSTIM (roe mi PLOE stim) helps your body make more platelets. This medicine is used to treat low platelets caused by chronic idiopathic thrombocytopenic purpura (ITP). This medicine may be used for other purposes; ask your health care provider or pharmacist if you have questions. COMMON BRAND NAME(S): Nplate What should I tell my health care provider before I take this medicine? They need to know if you have any of these conditions: -cancer or myelodysplastic syndrome -low blood counts, like low white cell, platelet, or red cell counts -take medicines that treat or prevent blood clots -an unusual or allergic reaction to romiplostim, mannitol, other medicines, foods, dyes, or preservatives -pregnant or trying to get pregnant -breast-feeding How should I use this medicine? This medicine is for injection under the skin. It is given by a health care professional in a hospital or clinic setting. A special MedGuide will be given to you before your injection. Read this information carefully each time. Talk to your pediatrician regarding the use of this medicine in children. Special care may be needed. Overdosage: If you think you have taken too much of this medicine contact a poison control center or emergency room at once. NOTE: This medicine is only for you. Do not share this medicine with others. What if I miss a dose? It is important not to miss your dose. Call your doctor or health care professional if you are unable to keep an appointment. What may interact with this medicine? Interactions are not expected. This list may not describe all possible interactions. Give your health care provider a list of all the medicines, herbs, non-prescription drugs, or dietary supplements you use. Also tell them if you smoke, drink alcohol, or use illegal drugs. Some items may interact with your medicine. What should I watch for while using this  medicine? Your condition will be monitored carefully while you are receiving this medicine. Visit your prescriber or health care professional for regular checks on your progress and for the needed blood tests. It is important to keep all appointments. What side effects may I notice from receiving this medicine? Side effects that you should report to your doctor or health care professional as soon as possible: -allergic reactions like skin rash, itching or hives, swelling of the face, lips, or tongue -shortness of breath, chest pain, swelling in a leg -unusual bleeding or bruising Side effects that usually do not require medical attention (report to your doctor or health care professional if they continue or are bothersome): -dizziness -headache -muscle aches -pain in arms and legs -stomach pain -trouble sleeping This list may not describe all possible side effects. Call your doctor for medical advice about side effects. You may report side effects to FDA at 1-800-FDA-1088. Where should I keep my medicine? This drug is given in a hospital or clinic and will not be stored at home. NOTE: This sheet is a summary. It may not cover all possible information. If you have questions about this medicine, talk to your doctor, pharmacist, or health care provider.  2018 Elsevier/Gold Standard (2008-02-05 15:13:04)  

## 2016-10-28 ENCOUNTER — Other Ambulatory Visit (HOSPITAL_BASED_OUTPATIENT_CLINIC_OR_DEPARTMENT_OTHER): Payer: Medicare Other

## 2016-10-28 ENCOUNTER — Ambulatory Visit (HOSPITAL_BASED_OUTPATIENT_CLINIC_OR_DEPARTMENT_OTHER): Payer: Medicare Other

## 2016-10-28 VITALS — BP 165/73 | HR 80 | Temp 97.8°F | Resp 20

## 2016-10-28 DIAGNOSIS — D46C Myelodysplastic syndrome with isolated del(5q) chromosomal abnormality: Secondary | ICD-10-CM | POA: Diagnosis not present

## 2016-10-28 DIAGNOSIS — N183 Chronic kidney disease, stage 3 unspecified: Secondary | ICD-10-CM

## 2016-10-28 DIAGNOSIS — D696 Thrombocytopenia, unspecified: Secondary | ICD-10-CM

## 2016-10-28 DIAGNOSIS — D539 Nutritional anemia, unspecified: Secondary | ICD-10-CM

## 2016-10-28 DIAGNOSIS — D631 Anemia in chronic kidney disease: Secondary | ICD-10-CM

## 2016-10-28 DIAGNOSIS — D693 Immune thrombocytopenic purpura: Secondary | ICD-10-CM

## 2016-10-28 LAB — TECHNOLOGIST REVIEW

## 2016-10-28 LAB — CBC WITH DIFFERENTIAL/PLATELET
BASO%: 0 % (ref 0.0–2.0)
Basophils Absolute: 0 10*3/uL (ref 0.0–0.1)
EOS ABS: 0 10*3/uL (ref 0.0–0.5)
EOS%: 0.7 % (ref 0.0–7.0)
HCT: 35.6 % — ABNORMAL LOW (ref 38.4–49.9)
HGB: 10.8 g/dL — ABNORMAL LOW (ref 13.0–17.1)
LYMPH%: 30.7 % (ref 14.0–49.0)
MCH: 24 pg — AB (ref 27.2–33.4)
MCHC: 30.3 g/dL — ABNORMAL LOW (ref 32.0–36.0)
MCV: 79.1 fL — AB (ref 79.3–98.0)
MONO#: 0.8 10*3/uL (ref 0.1–0.9)
MONO%: 15.2 % — ABNORMAL HIGH (ref 0.0–14.0)
NEUT#: 2.9 10*3/uL (ref 1.5–6.5)
NEUT%: 53.4 % (ref 39.0–75.0)
NRBC: 0 % (ref 0–0)
Platelets: 135 10*3/uL — ABNORMAL LOW (ref 140–400)
RBC: 4.5 10*6/uL (ref 4.20–5.82)
RDW: 23.9 % — ABNORMAL HIGH (ref 11.0–14.6)
WBC: 5.4 10*3/uL (ref 4.0–10.3)
lymph#: 1.7 10*3/uL (ref 0.9–3.3)

## 2016-10-28 MED ORDER — DARBEPOETIN ALFA 200 MCG/0.4ML IJ SOSY: 200.0000 ug | PREFILLED_SYRINGE | Freq: Once | INTRAMUSCULAR | Status: DC

## 2016-10-28 MED ORDER — ROMIPLOSTIM 250 MCG ~~LOC~~ SOLR
170.0000 ug | SUBCUTANEOUS | Status: DC
  Administered 2016-10-28: 170 ug via SUBCUTANEOUS
  Filled 2016-10-28: qty 0.34

## 2016-10-28 MED ORDER — CYANOCOBALAMIN 1000 MCG/ML IJ SOLN
1000.0000 ug | Freq: Once | INTRAMUSCULAR | Status: AC
  Administered 2016-10-28: 1000 ug via INTRAMUSCULAR

## 2016-10-28 NOTE — Patient Instructions (Signed)
Romiplostim injection What is this medicine? ROMIPLOSTIM (roe mi PLOE stim) helps your body make more platelets. This medicine is used to treat low platelets caused by chronic idiopathic thrombocytopenic purpura (ITP). This medicine may be used for other purposes; ask your health care provider or pharmacist if you have questions. COMMON BRAND NAME(S): Nplate What should I tell my health care provider before I take this medicine? They need to know if you have any of these conditions: -cancer or myelodysplastic syndrome -low blood counts, like low white cell, platelet, or red cell counts -take medicines that treat or prevent blood clots -an unusual or allergic reaction to romiplostim, mannitol, other medicines, foods, dyes, or preservatives -pregnant or trying to get pregnant -breast-feeding How should I use this medicine? This medicine is for injection under the skin. It is given by a health care professional in a hospital or clinic setting. A special MedGuide will be given to you before your injection. Read this information carefully each time. Talk to your pediatrician regarding the use of this medicine in children. Special care may be needed. Overdosage: If you think you have taken too much of this medicine contact a poison control center or emergency room at once. NOTE: This medicine is only for you. Do not share this medicine with others. What if I miss a dose? It is important not to miss your dose. Call your doctor or health care professional if you are unable to keep an appointment. What may interact with this medicine? Interactions are not expected. This list may not describe all possible interactions. Give your health care provider a list of all the medicines, herbs, non-prescription drugs, or dietary supplements you use. Also tell them if you smoke, drink alcohol, or use illegal drugs. Some items may interact with your medicine. What should I watch for while using this  medicine? Your condition will be monitored carefully while you are receiving this medicine. Visit your prescriber or health care professional for regular checks on your progress and for the needed blood tests. It is important to keep all appointments. What side effects may I notice from receiving this medicine? Side effects that you should report to your doctor or health care professional as soon as possible: -allergic reactions like skin rash, itching or hives, swelling of the face, lips, or tongue -shortness of breath, chest pain, swelling in a leg -unusual bleeding or bruising Side effects that usually do not require medical attention (report to your doctor or health care professional if they continue or are bothersome): -dizziness -headache -muscle aches -pain in arms and legs -stomach pain -trouble sleeping This list may not describe all possible side effects. Call your doctor for medical advice about side effects. You may report side effects to FDA at 1-800-FDA-1088. Where should I keep my medicine? This drug is given in a hospital or clinic and will not be stored at home. NOTE: This sheet is a summary. It may not cover all possible information. If you have questions about this medicine, talk to your doctor, pharmacist, or health care provider.  2018 Elsevier/Gold Standard (2008-02-05 15:13:04) Cyanocobalamin, Vitamin B12 injection What is this medicine? CYANOCOBALAMIN (sye an oh koe BAL a min) is a man made form of vitamin B12. Vitamin B12 is used in the growth of healthy blood cells, nerve cells, and proteins in the body. It also helps with the metabolism of fats and carbohydrates. This medicine is used to treat people who can not absorb vitamin B12. This medicine may be used  for other purposes; ask your health care provider or pharmacist if you have questions. COMMON BRAND NAME(S): B-12 Compliance Kit, B-12 Injection Kit, Cyomin, LA-12, Nutri-Twelve, Physicians EZ Use B-12,  Primabalt What should I tell my health care provider before I take this medicine? They need to know if you have any of these conditions: -kidney disease -Leber's disease -megaloblastic anemia -an unusual or allergic reaction to cyanocobalamin, cobalt, other medicines, foods, dyes, or preservatives -pregnant or trying to get pregnant -breast-feeding How should I use this medicine? This medicine is injected into a muscle or deeply under the skin. It is usually given by a health care professional in a clinic or doctor's office. However, your doctor may teach you how to inject yourself. Follow all instructions. Talk to your pediatrician regarding the use of this medicine in children. Special care may be needed. Overdosage: If you think you have taken too much of this medicine contact a poison control center or emergency room at once. NOTE: This medicine is only for you. Do not share this medicine with others. What if I miss a dose? If you are given your dose at a clinic or doctor's office, call to reschedule your appointment. If you give your own injections and you miss a dose, take it as soon as you can. If it is almost time for your next dose, take only that dose. Do not take double or extra doses. What may interact with this medicine? -colchicine -heavy alcohol intake This list may not describe all possible interactions. Give your health care provider a list of all the medicines, herbs, non-prescription drugs, or dietary supplements you use. Also tell them if you smoke, drink alcohol, or use illegal drugs. Some items may interact with your medicine. What should I watch for while using this medicine? Visit your doctor or health care professional regularly. You may need blood work done while you are taking this medicine. You may need to follow a special diet. Talk to your doctor. Limit your alcohol intake and avoid smoking to get the best benefit. What side effects may I notice from receiving  this medicine? Side effects that you should report to your doctor or health care professional as soon as possible: -allergic reactions like skin rash, itching or hives, swelling of the face, lips, or tongue -blue tint to skin -chest tightness, pain -difficulty breathing, wheezing -dizziness -red, swollen painful area on the leg Side effects that usually do not require medical attention (report to your doctor or health care professional if they continue or are bothersome): -diarrhea -headache This list may not describe all possible side effects. Call your doctor for medical advice about side effects. You may report side effects to FDA at 1-800-FDA-1088. Where should I keep my medicine? Keep out of the reach of children. Store at room temperature between 15 and 30 degrees C (59 and 85 degrees F). Protect from light. Throw away any unused medicine after the expiration date. NOTE: This sheet is a summary. It may not cover all possible information. If you have questions about this medicine, talk to your doctor, pharmacist, or health care provider.  2018 Elsevier/Gold Standard (2007-09-18 22:10:20)

## 2016-11-01 ENCOUNTER — Other Ambulatory Visit: Payer: Self-pay | Admitting: Family Medicine

## 2016-11-04 ENCOUNTER — Ambulatory Visit: Payer: Medicare Other

## 2016-11-04 ENCOUNTER — Other Ambulatory Visit (HOSPITAL_BASED_OUTPATIENT_CLINIC_OR_DEPARTMENT_OTHER): Payer: Medicare Other

## 2016-11-04 ENCOUNTER — Encounter (HOSPITAL_BASED_OUTPATIENT_CLINIC_OR_DEPARTMENT_OTHER): Payer: Medicare Other

## 2016-11-04 DIAGNOSIS — D46C Myelodysplastic syndrome with isolated del(5q) chromosomal abnormality: Secondary | ICD-10-CM

## 2016-11-04 DIAGNOSIS — N183 Chronic kidney disease, stage 3 unspecified: Secondary | ICD-10-CM

## 2016-11-04 DIAGNOSIS — D696 Thrombocytopenia, unspecified: Secondary | ICD-10-CM

## 2016-11-04 DIAGNOSIS — D693 Immune thrombocytopenic purpura: Secondary | ICD-10-CM

## 2016-11-04 DIAGNOSIS — D539 Nutritional anemia, unspecified: Secondary | ICD-10-CM

## 2016-11-04 DIAGNOSIS — D631 Anemia in chronic kidney disease: Secondary | ICD-10-CM

## 2016-11-04 LAB — CBC WITH DIFFERENTIAL/PLATELET
BASO%: 0.3 % (ref 0.0–2.0)
Basophils Absolute: 0 10*3/uL (ref 0.0–0.1)
EOS%: 0.8 % (ref 0.0–7.0)
Eosinophils Absolute: 0 10*3/uL (ref 0.0–0.5)
HEMATOCRIT: 36 % — AB (ref 38.4–49.9)
HEMOGLOBIN: 10.8 g/dL — AB (ref 13.0–17.1)
LYMPH%: 38.9 % (ref 14.0–49.0)
MCH: 23.9 pg — AB (ref 27.2–33.4)
MCHC: 30 g/dL — ABNORMAL LOW (ref 32.0–36.0)
MCV: 79.6 fL (ref 79.3–98.0)
MONO#: 1 10*3/uL — AB (ref 0.1–0.9)
MONO%: 28.7 % — ABNORMAL HIGH (ref 0.0–14.0)
NEUT%: 31.3 % — AB (ref 39.0–75.0)
NEUTROS ABS: 1.1 10*3/uL — AB (ref 1.5–6.5)
NRBC: 0 % (ref 0–0)
Platelets: 130 10*3/uL — ABNORMAL LOW (ref 140–400)
RBC: 4.52 10*6/uL (ref 4.20–5.82)
RDW: 23.9 % — AB (ref 11.0–14.6)
WBC: 3.6 10*3/uL — ABNORMAL LOW (ref 4.0–10.3)
lymph#: 1.4 10*3/uL (ref 0.9–3.3)

## 2016-11-04 MED ORDER — ROMIPLOSTIM 250 MCG ~~LOC~~ SOLR
170.0000 ug | SUBCUTANEOUS | Status: DC
  Administered 2016-11-04: 170 ug via SUBCUTANEOUS
  Filled 2016-11-04: qty 0.34

## 2016-11-04 NOTE — Patient Instructions (Signed)
Romiplostim injection What is this medicine? ROMIPLOSTIM (roe mi PLOE stim) helps your body make more platelets. This medicine is used to treat low platelets caused by chronic idiopathic thrombocytopenic purpura (ITP). This medicine may be used for other purposes; ask your health care provider or pharmacist if you have questions. COMMON BRAND NAME(S): Nplate What should I tell my health care provider before I take this medicine? They need to know if you have any of these conditions: -cancer or myelodysplastic syndrome -low blood counts, like low white cell, platelet, or red cell counts -take medicines that treat or prevent blood clots -an unusual or allergic reaction to romiplostim, mannitol, other medicines, foods, dyes, or preservatives -pregnant or trying to get pregnant -breast-feeding How should I use this medicine? This medicine is for injection under the skin. It is given by a health care professional in a hospital or clinic setting. A special MedGuide will be given to you before your injection. Read this information carefully each time. Talk to your pediatrician regarding the use of this medicine in children. Special care may be needed. Overdosage: If you think you have taken too much of this medicine contact a poison control center or emergency room at once. NOTE: This medicine is only for you. Do not share this medicine with others. What if I miss a dose? It is important not to miss your dose. Call your doctor or health care professional if you are unable to keep an appointment. What may interact with this medicine? Interactions are not expected. This list may not describe all possible interactions. Give your health care provider a list of all the medicines, herbs, non-prescription drugs, or dietary supplements you use. Also tell them if you smoke, drink alcohol, or use illegal drugs. Some items may interact with your medicine. What should I watch for while using this  medicine? Your condition will be monitored carefully while you are receiving this medicine. Visit your prescriber or health care professional for regular checks on your progress and for the needed blood tests. It is important to keep all appointments. What side effects may I notice from receiving this medicine? Side effects that you should report to your doctor or health care professional as soon as possible: -allergic reactions like skin rash, itching or hives, swelling of the face, lips, or tongue -shortness of breath, chest pain, swelling in a leg -unusual bleeding or bruising Side effects that usually do not require medical attention (report to your doctor or health care professional if they continue or are bothersome): -dizziness -headache -muscle aches -pain in arms and legs -stomach pain -trouble sleeping This list may not describe all possible side effects. Call your doctor for medical advice about side effects. You may report side effects to FDA at 1-800-FDA-1088. Where should I keep my medicine? This drug is given in a hospital or clinic and will not be stored at home. NOTE: This sheet is a summary. It may not cover all possible information. If you have questions about this medicine, talk to your doctor, pharmacist, or health care provider.  2018 Elsevier/Gold Standard (2008-02-05 15:13:04)  

## 2016-11-11 ENCOUNTER — Other Ambulatory Visit (HOSPITAL_BASED_OUTPATIENT_CLINIC_OR_DEPARTMENT_OTHER): Payer: Medicare Other

## 2016-11-11 ENCOUNTER — Ambulatory Visit (HOSPITAL_BASED_OUTPATIENT_CLINIC_OR_DEPARTMENT_OTHER): Payer: Medicare Other

## 2016-11-11 VITALS — BP 157/75 | HR 84 | Temp 97.2°F | Resp 20

## 2016-11-11 DIAGNOSIS — D693 Immune thrombocytopenic purpura: Secondary | ICD-10-CM | POA: Diagnosis present

## 2016-11-11 DIAGNOSIS — D46C Myelodysplastic syndrome with isolated del(5q) chromosomal abnormality: Secondary | ICD-10-CM

## 2016-11-11 DIAGNOSIS — D539 Nutritional anemia, unspecified: Secondary | ICD-10-CM

## 2016-11-11 DIAGNOSIS — N183 Chronic kidney disease, stage 3 unspecified: Secondary | ICD-10-CM

## 2016-11-11 DIAGNOSIS — D631 Anemia in chronic kidney disease: Secondary | ICD-10-CM

## 2016-11-11 DIAGNOSIS — D696 Thrombocytopenia, unspecified: Secondary | ICD-10-CM

## 2016-11-11 LAB — CBC WITH DIFFERENTIAL/PLATELET
BASO%: 0 % (ref 0.0–2.0)
BASOS ABS: 0 10*3/uL (ref 0.0–0.1)
EOS ABS: 0 10*3/uL (ref 0.0–0.5)
EOS%: 0.8 % (ref 0.0–7.0)
HCT: 36 % — ABNORMAL LOW (ref 38.4–49.9)
HGB: 10.9 g/dL — ABNORMAL LOW (ref 13.0–17.1)
LYMPH%: 33.6 % (ref 14.0–49.0)
MCH: 24.2 pg — AB (ref 27.2–33.4)
MCHC: 30.3 g/dL — ABNORMAL LOW (ref 32.0–36.0)
MCV: 80 fL (ref 79.3–98.0)
MONO#: 0.7 10*3/uL (ref 0.1–0.9)
MONO%: 13.4 % (ref 0.0–14.0)
NEUT#: 2.6 10*3/uL (ref 1.5–6.5)
NEUT%: 52.2 % (ref 39.0–75.0)
NRBC: 0 % (ref 0–0)
RBC: 4.5 10*6/uL (ref 4.20–5.82)
RDW: 23.2 % — AB (ref 11.0–14.6)
WBC: 4.9 10*3/uL (ref 4.0–10.3)
lymph#: 1.7 10*3/uL (ref 0.9–3.3)

## 2016-11-11 LAB — TECHNOLOGIST REVIEW

## 2016-11-11 MED ORDER — ROMIPLOSTIM 250 MCG ~~LOC~~ SOLR
170.0000 ug | SUBCUTANEOUS | Status: DC
  Administered 2016-11-11: 170 ug via SUBCUTANEOUS
  Filled 2016-11-11: qty 0.34

## 2016-11-11 NOTE — Patient Instructions (Signed)
Romiplostim injection What is this medicine? ROMIPLOSTIM (roe mi PLOE stim) helps your body make more platelets. This medicine is used to treat low platelets caused by chronic idiopathic thrombocytopenic purpura (ITP). This medicine may be used for other purposes; ask your health care provider or pharmacist if you have questions. COMMON BRAND NAME(S): Nplate What should I tell my health care provider before I take this medicine? They need to know if you have any of these conditions: -cancer or myelodysplastic syndrome -low blood counts, like low white cell, platelet, or red cell counts -take medicines that treat or prevent blood clots -an unusual or allergic reaction to romiplostim, mannitol, other medicines, foods, dyes, or preservatives -pregnant or trying to get pregnant -breast-feeding How should I use this medicine? This medicine is for injection under the skin. It is given by a health care professional in a hospital or clinic setting. A special MedGuide will be given to you before your injection. Read this information carefully each time. Talk to your pediatrician regarding the use of this medicine in children. Special care may be needed. Overdosage: If you think you have taken too much of this medicine contact a poison control center or emergency room at once. NOTE: This medicine is only for you. Do not share this medicine with others. What if I miss a dose? It is important not to miss your dose. Call your doctor or health care professional if you are unable to keep an appointment. What may interact with this medicine? Interactions are not expected. This list may not describe all possible interactions. Give your health care provider a list of all the medicines, herbs, non-prescription drugs, or dietary supplements you use. Also tell them if you smoke, drink alcohol, or use illegal drugs. Some items may interact with your medicine. What should I watch for while using this  medicine? Your condition will be monitored carefully while you are receiving this medicine. Visit your prescriber or health care professional for regular checks on your progress and for the needed blood tests. It is important to keep all appointments. What side effects may I notice from receiving this medicine? Side effects that you should report to your doctor or health care professional as soon as possible: -allergic reactions like skin rash, itching or hives, swelling of the face, lips, or tongue -shortness of breath, chest pain, swelling in a leg -unusual bleeding or bruising Side effects that usually do not require medical attention (report to your doctor or health care professional if they continue or are bothersome): -dizziness -headache -muscle aches -pain in arms and legs -stomach pain -trouble sleeping This list may not describe all possible side effects. Call your doctor for medical advice about side effects. You may report side effects to FDA at 1-800-FDA-1088. Where should I keep my medicine? This drug is given in a hospital or clinic and will not be stored at home. NOTE: This sheet is a summary. It may not cover all possible information. If you have questions about this medicine, talk to your doctor, pharmacist, or health care provider.  2018 Elsevier/Gold Standard (2008-02-05 15:13:04)  

## 2016-11-18 ENCOUNTER — Ambulatory Visit (HOSPITAL_BASED_OUTPATIENT_CLINIC_OR_DEPARTMENT_OTHER): Payer: Medicare Other

## 2016-11-18 ENCOUNTER — Other Ambulatory Visit (HOSPITAL_BASED_OUTPATIENT_CLINIC_OR_DEPARTMENT_OTHER): Payer: Medicare Other

## 2016-11-18 VITALS — BP 142/67 | HR 73 | Temp 97.2°F | Resp 18

## 2016-11-18 DIAGNOSIS — D693 Immune thrombocytopenic purpura: Secondary | ICD-10-CM

## 2016-11-18 DIAGNOSIS — D631 Anemia in chronic kidney disease: Secondary | ICD-10-CM

## 2016-11-18 DIAGNOSIS — D696 Thrombocytopenia, unspecified: Secondary | ICD-10-CM

## 2016-11-18 DIAGNOSIS — N183 Chronic kidney disease, stage 3 unspecified: Secondary | ICD-10-CM

## 2016-11-18 DIAGNOSIS — D46C Myelodysplastic syndrome with isolated del(5q) chromosomal abnormality: Secondary | ICD-10-CM

## 2016-11-18 DIAGNOSIS — D539 Nutritional anemia, unspecified: Secondary | ICD-10-CM

## 2016-11-18 LAB — CBC WITH DIFFERENTIAL/PLATELET
BASO%: 0.2 % (ref 0.0–2.0)
Basophils Absolute: 0 10*3/uL (ref 0.0–0.1)
EOS ABS: 0.1 10*3/uL (ref 0.0–0.5)
EOS%: 1.2 % (ref 0.0–7.0)
HEMATOCRIT: 36.3 % — AB (ref 38.4–49.9)
HGB: 11 g/dL — ABNORMAL LOW (ref 13.0–17.1)
LYMPH%: 36.3 % (ref 14.0–49.0)
MCH: 24.2 pg — ABNORMAL LOW (ref 27.2–33.4)
MCHC: 30.3 g/dL — ABNORMAL LOW (ref 32.0–36.0)
MCV: 79.8 fL (ref 79.3–98.0)
MONO#: 0.8 10*3/uL (ref 0.1–0.9)
MONO%: 14.6 % — AB (ref 0.0–14.0)
NEUT#: 2.5 10*3/uL (ref 1.5–6.5)
NEUT%: 47.7 % (ref 39.0–75.0)
NRBC: 0 % (ref 0–0)
RBC: 4.55 10*6/uL (ref 4.20–5.82)
RDW: 23.2 % — AB (ref 11.0–14.6)
WBC: 5.2 10*3/uL (ref 4.0–10.3)
lymph#: 1.9 10*3/uL (ref 0.9–3.3)

## 2016-11-18 LAB — TECHNOLOGIST REVIEW

## 2016-11-18 MED ORDER — ROMIPLOSTIM 250 MCG ~~LOC~~ SOLR
170.0000 ug | SUBCUTANEOUS | Status: DC
  Administered 2016-11-18: 170 ug via SUBCUTANEOUS
  Filled 2016-11-18: qty 0.34

## 2016-11-18 NOTE — Patient Instructions (Signed)
Romiplostim injection What is this medicine? ROMIPLOSTIM (roe mi PLOE stim) helps your body make more platelets. This medicine is used to treat low platelets caused by chronic idiopathic thrombocytopenic purpura (ITP). This medicine may be used for other purposes; ask your health care provider or pharmacist if you have questions. COMMON BRAND NAME(S): Nplate What should I tell my health care provider before I take this medicine? They need to know if you have any of these conditions: -cancer or myelodysplastic syndrome -low blood counts, like low white cell, platelet, or red cell counts -take medicines that treat or prevent blood clots -an unusual or allergic reaction to romiplostim, mannitol, other medicines, foods, dyes, or preservatives -pregnant or trying to get pregnant -breast-feeding How should I use this medicine? This medicine is for injection under the skin. It is given by a health care professional in a hospital or clinic setting. A special MedGuide will be given to you before your injection. Read this information carefully each time. Talk to your pediatrician regarding the use of this medicine in children. Special care may be needed. Overdosage: If you think you have taken too much of this medicine contact a poison control center or emergency room at once. NOTE: This medicine is only for you. Do not share this medicine with others. What if I miss a dose? It is important not to miss your dose. Call your doctor or health care professional if you are unable to keep an appointment. What may interact with this medicine? Interactions are not expected. This list may not describe all possible interactions. Give your health care provider a list of all the medicines, herbs, non-prescription drugs, or dietary supplements you use. Also tell them if you smoke, drink alcohol, or use illegal drugs. Some items may interact with your medicine. What should I watch for while using this  medicine? Your condition will be monitored carefully while you are receiving this medicine. Visit your prescriber or health care professional for regular checks on your progress and for the needed blood tests. It is important to keep all appointments. What side effects may I notice from receiving this medicine? Side effects that you should report to your doctor or health care professional as soon as possible: -allergic reactions like skin rash, itching or hives, swelling of the face, lips, or tongue -shortness of breath, chest pain, swelling in a leg -unusual bleeding or bruising Side effects that usually do not require medical attention (report to your doctor or health care professional if they continue or are bothersome): -dizziness -headache -muscle aches -pain in arms and legs -stomach pain -trouble sleeping This list may not describe all possible side effects. Call your doctor for medical advice about side effects. You may report side effects to FDA at 1-800-FDA-1088. Where should I keep my medicine? This drug is given in a hospital or clinic and will not be stored at home. NOTE: This sheet is a summary. It may not cover all possible information. If you have questions about this medicine, talk to your doctor, pharmacist, or health care provider.  2018 Elsevier/Gold Standard (2008-02-05 15:13:04)  

## 2016-11-23 ENCOUNTER — Encounter: Payer: Self-pay | Admitting: Family Medicine

## 2016-11-23 ENCOUNTER — Ambulatory Visit (INDEPENDENT_AMBULATORY_CARE_PROVIDER_SITE_OTHER): Payer: Medicare Other | Admitting: Family Medicine

## 2016-11-23 VITALS — BP 132/76 | HR 60 | Temp 97.4°F | Resp 14 | Ht 69.0 in | Wt 183.0 lb

## 2016-11-23 DIAGNOSIS — J209 Acute bronchitis, unspecified: Secondary | ICD-10-CM | POA: Diagnosis not present

## 2016-11-23 DIAGNOSIS — L989 Disorder of the skin and subcutaneous tissue, unspecified: Secondary | ICD-10-CM | POA: Diagnosis not present

## 2016-11-23 MED ORDER — AZITHROMYCIN 250 MG PO TABS: ORAL_TABLET | ORAL | 0 refills | Status: DC

## 2016-11-23 NOTE — Progress Notes (Signed)
   Subjective:    Patient ID: Ricardo Watkins, male    DOB: 20-Sep-1930, 83 y.o.   MRN: [id number]  Patient presents for Cough (x2 weeks- nonproductive cough, HA, sore throat that has resolved) Here with. He is competent history of diabetes mellitus coronary artery disease history of stroke and  non-Hodgkin's lymphoma/MDS.Also history of chronic pleural effusion For the past 2 weeks he has had cough with minimal production sore throat initially but that is now improved. He's also had some headache mild runny nose. His wife and his son who live with him had viral illness but there is Resolved this has lingered on. He is a little immunocompromised. They've been giving him over-the-counter cough supplements including NyQuil which has not helped. He has not had any fever no difficulty breathing no wheezing.  At the end of the visit they also noted a growth on his scalp that has been present for months but it seems to be enlarging like to have this evaluated.   Review Of Systems:  GEN- denies fatigue, fever, weight loss,weakness, recent illness HEENT- denies eye drainage, change in vision, nasal discharge, CVS- denies chest pain, palpitations RESP- denies SOB, +cough, wheeze ABD- denies N/V, change in stools, abd pain GU- denies dysuria, hematuria, dribbling, incontinence MSK- denies joint pain, muscle aches, injury Neuro- denies headache, dizziness, syncope, seizure activity       Objective:    BP 132/76   Pulse 60   Temp 97.4 F (36.3 C) (Oral)   Resp 14   Ht 5\' 9"  (1.753 m)   Wt 183 lb (83 kg)   SpO2 96%   BMI 27.02 kg/m  GEN- NAD, alert and oriented x3, fairly well appearing HEENT- PERRL, EOMI, non injected sclera, pink conjunctiva, MMM, oropharynx clear,nares clear, TM mild wax Neck- Supple, no LAD CVS- RRR, 3/6 SEM RESP- mild upper airway congestion, no crackles, no rales, normal WOB ABD-NABS,soft,NT,ND EXT- No edema Pulses- Radial 2+        Assessment & Plan:       Problem List Items Addressed This Visit    None    Visit Diagnoses    Scalp lesion    -  Primary   send to dermatology, ? cutaneous horn   Acute bronchitis, unspecified organism       Treat zpak, coridan, if not improved needs CXR      Note: This dictation was prepared with Dragon dictation along with smaller phrase technology. Any transcriptional errors that result from this process are unintentional.

## 2016-11-23 NOTE — Patient Instructions (Signed)
Referral to dermatology  Take antibiotics give Corcidan or Delsym Call Friday if not improved

## 2016-11-25 ENCOUNTER — Ambulatory Visit (HOSPITAL_BASED_OUTPATIENT_CLINIC_OR_DEPARTMENT_OTHER): Payer: Medicare Other | Admitting: Hematology and Oncology

## 2016-11-25 ENCOUNTER — Ambulatory Visit (HOSPITAL_BASED_OUTPATIENT_CLINIC_OR_DEPARTMENT_OTHER): Payer: Medicare Other

## 2016-11-25 ENCOUNTER — Other Ambulatory Visit (HOSPITAL_BASED_OUTPATIENT_CLINIC_OR_DEPARTMENT_OTHER): Payer: Medicare Other

## 2016-11-25 ENCOUNTER — Telehealth: Payer: Self-pay | Admitting: Hematology and Oncology

## 2016-11-25 ENCOUNTER — Encounter: Payer: Self-pay | Admitting: Hematology and Oncology

## 2016-11-25 DIAGNOSIS — D631 Anemia in chronic kidney disease: Secondary | ICD-10-CM

## 2016-11-25 DIAGNOSIS — N183 Chronic kidney disease, stage 3 unspecified: Secondary | ICD-10-CM

## 2016-11-25 DIAGNOSIS — D46C Myelodysplastic syndrome with isolated del(5q) chromosomal abnormality: Secondary | ICD-10-CM | POA: Diagnosis not present

## 2016-11-25 DIAGNOSIS — R05 Cough: Secondary | ICD-10-CM | POA: Diagnosis not present

## 2016-11-25 DIAGNOSIS — Z8579 Personal history of other malignant neoplasms of lymphoid, hematopoietic and related tissues: Secondary | ICD-10-CM

## 2016-11-25 DIAGNOSIS — D696 Thrombocytopenia, unspecified: Secondary | ICD-10-CM

## 2016-11-25 DIAGNOSIS — D693 Immune thrombocytopenic purpura: Secondary | ICD-10-CM

## 2016-11-25 DIAGNOSIS — D539 Nutritional anemia, unspecified: Secondary | ICD-10-CM

## 2016-11-25 DIAGNOSIS — R053 Chronic cough: Secondary | ICD-10-CM

## 2016-11-25 DIAGNOSIS — Z8572 Personal history of non-Hodgkin lymphomas: Secondary | ICD-10-CM | POA: Diagnosis not present

## 2016-11-25 LAB — CBC WITH DIFFERENTIAL/PLATELET
BASO%: 0.3 % (ref 0.0–2.0)
Basophils Absolute: 0 10*3/uL (ref 0.0–0.1)
EOS%: 1.1 % (ref 0.0–7.0)
Eosinophils Absolute: 0 10*3/uL (ref 0.0–0.5)
HEMATOCRIT: 36.2 % — AB (ref 38.4–49.9)
HGB: 10.9 g/dL — ABNORMAL LOW (ref 13.0–17.1)
LYMPH%: 44.4 % (ref 14.0–49.0)
MCH: 24.2 pg — AB (ref 27.2–33.4)
MCHC: 30.1 g/dL — AB (ref 32.0–36.0)
MCV: 80.3 fL (ref 79.3–98.0)
MONO#: 0.4 10*3/uL (ref 0.1–0.9)
MONO%: 11.8 % (ref 0.0–14.0)
NEUT%: 42.4 % (ref 39.0–75.0)
NEUTROS ABS: 1.6 10*3/uL (ref 1.5–6.5)
Platelets: 66 10*3/uL — ABNORMAL LOW (ref 140–400)
RBC: 4.51 10*6/uL (ref 4.20–5.82)
RDW: 22.9 % — ABNORMAL HIGH (ref 11.0–14.6)
WBC: 3.7 10*3/uL — AB (ref 4.0–10.3)
lymph#: 1.7 10*3/uL (ref 0.9–3.3)
nRBC: 0 % (ref 0–0)

## 2016-11-25 MED ORDER — ROMIPLOSTIM 250 MCG ~~LOC~~ SOLR
170.0000 ug | SUBCUTANEOUS | Status: DC
  Administered 2016-11-25: 170 ug via SUBCUTANEOUS
  Filled 2016-11-25: qty 0.34

## 2016-11-25 MED ORDER — CYANOCOBALAMIN 1000 MCG/ML IJ SOLN
1000.0000 ug | Freq: Once | INTRAMUSCULAR | Status: AC
  Administered 2016-11-25: 1000 ug via INTRAMUSCULAR

## 2016-11-25 NOTE — Assessment & Plan Note (Signed)
Prior cytology from thoracentesis excluded malignancy  His last PET CT scan show abnormalities that are related to pleural effusion. Clinically, he is doing well.  I recommend observation only.

## 2016-11-25 NOTE — Assessment & Plan Note (Signed)
He has remarkable response to Nplate injection.  He has recent resolution of neutropenia, improvement of anemia as well as improvement of thrombocytopenia. He will continue monthly vitamin B12 injection along with Nplate injection today. I will see him back in 6 months. The goal of injection is to keep platelet count greater than 50,000 He will get darbepoetin injection to keep hemoglobin greater than 10

## 2016-11-25 NOTE — Progress Notes (Signed)
[facility] OFFICE PROGRESS NOTE  Patient Care Team: [name], MD as PCP - General (Family Medicine) Heath Lark, MD as Consulting Physician (Hematology and Oncology) [name], MD as Consulting Physician (Cardiology)  SUMMARY OF ONCOLOGIC HISTORY: Oncology History   Calculated R-IPSS score at low risk     MDS (myelodysplastic syndrome) with 5q deletion ([place])   11/07/2014 Bone Marrow Biopsy    Accession: [id number] marrow biopsy showed myelodysplastic syndrome with 5q deletion.      04/02/2015 - 07/01/2016 Chemotherapy    He started taking Promacta daily for thrombocytopenia, stopped due to lack of response      07/15/2016 Miscellaneous    Starting July 15, 2016, he is receiving Nplate injection along with darbepoetin       INTERVAL HISTORY: Please see below for problem oriented charting. He returns with family members. He has been doing well for many months with combination of Nplate, vitamin B12 and Aranesp injection He has not needed Aranesp for a while He was diagnosed with recent bronchitis with cough.  He is prescribed azithromycin by his primary care doctor The patient has sedentary lifestyle He only eat one meal per day.  He has stable weight.  REVIEW OF SYSTEMS:   Constitutional: Denies fevers, chills or abnormal weight loss Eyes: Denies blurriness of vision Ears, nose, mouth, throat, and face: Denies mucositis or sore throat Cardiovascular: Denies palpitation, chest discomfort or lower extremity swelling Gastrointestinal:  Denies nausea, heartburn or change in bowel habits Skin: Denies abnormal skin rashes Lymphatics: Denies new lymphadenopathy or easy bruising Neurological:Denies numbness, tingling or new weaknesses Behavioral/Psych: Mood is stable, no new changes  All other systems were reviewed with the patient and are negative.  I have reviewed the past medical history, past surgical history, social history and family  history with the patient and they are unchanged from previous note.  ALLERGIES:  is allergic to glucophage [metformin hydrochloride]; zetia [ezetimibe]; fenofibrate; and niacin-lovastatin er.  MEDICATIONS:  Current Outpatient Prescriptions  Medication Sig Dispense Refill  . acetaminophen (TYLENOL) 500 MG tablet Take 1,000 mg by mouth every 6 (six) hours as needed for mild pain, moderate pain or headache.     Marland Kitchen azithromycin (ZITHROMAX) 250 MG tablet Take 2 tablets x 1 day, then `1 tablet daily x 4 days 6 tablet 0  . clopidogrel (PLAVIX) 75 MG tablet TAKE 1 TABLET BY MOUTH ONCE A DAY WITH BREAKFAST 90 tablet 1  . cyanocobalamin (,VITAMIN B-12,) 1000 MCG/ML injection Inject 1 mL (1,000 mcg total) into the muscle every 30 (thirty) days. 10 mL 3  . Dextromethorphan Polistirex (DELSYM PO) Take 15 mLs by mouth at bedtime.    . furosemide (LASIX) 40 MG tablet TAKE 1 TABLET BY MOUTH ONCE A DAY 90 tablet 3  . glucose blood test strip 1 each by Other route daily. Fasting each morning 100 each 3  . hydrocortisone cream 1 % Apply 1 application topically at bedtime as needed for itching.    . Insulin Glargine (LANTUS SOLOSTAR) 100 UNIT/ML Solostar Pen INJECT 25 UNITS EVERY NIGHT AT BEDTIME 5 pen 3  . meclizine (ANTIVERT) 12.5 MG tablet Take 1 tablet (12.5 mg total) by mouth 3 (three) times daily as needed for dizziness. (Patient not taking: Reported on 11/23/2016) 30 tablet 0  . metoprolol succinate (TOPROL-XL) 100 MG 24 hr tablet TAKE 1 TABLET BY MOUTH ONCE A DAY 90 tablet 3  . Multiple Vitamin (MULTIVITAMIN WITH MINERALS) TABS tablet Take 1 tablet by mouth at  bedtime.    Marland Kitchen OVER THE COUNTER MEDICATION Place 1 drop into both eyes daily as needed (dry eyes). Over the counter lubricating eye drop    . SYRINGE-NEEDLE, DISP, 3 ML (LUER LOCK SAFETY SYRINGES) 25G X 1" 3 ML MISC Use with B 12 injections q month 100 each 3  . tamsulosin (FLOMAX) 0.4 MG CAPS capsule TAKE 1 CAPSULE BY MOUTH ONCE DAILY 90 capsule 3    Current Facility-Administered Medications  Medication Dose Route Frequency Provider Last Rate Last Dose  . cyanocobalamin ((VITAMIN B-12)) injection 1,000 mcg  1,000 mcg Subcutaneous Q30 days [name], MD   1,000 mcg at 07/06/16 1430   Facility-Administered Medications Ordered in Other Visits  Medication Dose Route Frequency Provider Last Rate Last Dose  . cyanocobalamin ((VITAMIN B-12)) injection 1,000 mcg  1,000 mcg Intramuscular Once [name], Maximilien Hayashi, MD      . romiPLOStim (NPLATE) injection 170 mcg  170 mcg Subcutaneous Weekly [name], Xane Amsden, MD        PHYSICAL EXAMINATION: ECOG PERFORMANCE STATUS: 2 - Symptomatic, <50% confined to bed  Vitals:   11/25/16 1307  BP: 137/63  Pulse: 73  Resp: 18  Temp: 97.7 F (36.5 C)   Filed Weights   11/25/16 1307  Weight: 182 lb (82.6 kg)    GENERAL:alert, no distress and comfortable SKIN: skin color, texture, turgor are normal, no rashes or significant lesions EYES: normal, Conjunctiva are pink and non-injected, sclera clear OROPHARYNX:no exudate, no erythema and lips, buccal mucosa, and tongue normal  NECK: supple, thyroid normal size, non-tender, without nodularity LYMPH:  no palpable lymphadenopathy in the cervical, axillary or inguinal LUNGS: clear to auscultation and percussion with normal breathing effort HEART: regular rate & rhythm and no murmurs and no lower extremity edema ABDOMEN:abdomen soft, non-tender and normal bowel sounds Musculoskeletal:no cyanosis of digits and no clubbing  NEURO: alert & oriented x 3 with fluent speech, no focal motor/sensory deficits  LABORATORY DATA:  I have reviewed the data as listed    Component Value Date/Time   NA 139 06/24/2016 1439   NA 138 03/02/2016 1238   K 4.0 06/24/2016 1439   K 4.6 03/02/2016 1238   CL 103 06/24/2016 1439   CL 107 09/27/2012 0940   CO2 24 06/24/2016 1439   CO2 25 03/02/2016 1238   GLUCOSE 235 (H) 06/24/2016 1439   GLUCOSE 163 (H) 03/02/2016 1238    GLUCOSE 89 09/27/2012 0940   BUN 22 06/24/2016 1439   BUN 20.9 03/02/2016 1238   CREATININE 1.54 (H) 06/24/2016 1439   CREATININE 1.8 (H) 03/02/2016 1238   CALCIUM 8.3 (L) 06/24/2016 1439   CALCIUM 8.4 03/02/2016 1238   PROT 6.4 (L) 04/10/2016 0430   PROT 8.2 03/02/2016 1238   ALBUMIN 1.6 (L) 04/10/2016 0430   ALBUMIN 2.6 (L) 03/02/2016 1238   AST 14 (L) 04/10/2016 0430   AST 21 03/02/2016 1238   ALT 13 (L) 04/10/2016 0430   ALT 29 03/02/2016 1238   ALKPHOS 59 04/10/2016 0430   ALKPHOS 84 03/02/2016 1238   BILITOT 0.4 04/10/2016 0430   BILITOT <0.30 03/02/2016 1238   GFRNONAA 41 (L) 06/24/2016 1439   GFRAA 48 (L) 06/24/2016 1439    No results found for: SPEP, UPEP  Lab Results  Component Value Date   WBC 3.7 (L) 11/25/2016   NEUTROABS 1.6 11/25/2016   HGB 10.9 (L) 11/25/2016   HCT 36.2 (L) 11/25/2016   MCV 80.3 11/25/2016   PLT 66 (L) 11/25/2016  Chemistry      Component Value Date/Time   NA 139 06/24/2016 1439   NA 138 03/02/2016 1238   K 4.0 06/24/2016 1439   K 4.6 03/02/2016 1238   CL 103 06/24/2016 1439   CL 107 09/27/2012 0940   CO2 24 06/24/2016 1439   CO2 25 03/02/2016 1238   BUN 22 06/24/2016 1439   BUN 20.9 03/02/2016 1238   CREATININE 1.54 (H) 06/24/2016 1439   CREATININE 1.8 (H) 03/02/2016 1238      Component Value Date/Time   CALCIUM 8.3 (L) 06/24/2016 1439   CALCIUM 8.4 03/02/2016 1238   ALKPHOS 59 04/10/2016 0430   ALKPHOS 84 03/02/2016 1238   AST 14 (L) 04/10/2016 0430   AST 21 03/02/2016 1238   ALT 13 (L) 04/10/2016 0430   ALT 29 03/02/2016 1238   BILITOT 0.4 04/10/2016 0430   BILITOT <0.30 03/02/2016 1238      ASSESSMENT & PLAN:  MDS (myelodysplastic syndrome) with 5q deletion He has remarkable response to Nplate injection.  He has recent resolution of neutropenia, improvement of anemia as well as improvement of thrombocytopenia. He will continue monthly vitamin B12 injection along with Nplate injection today. I will see him  back in 6 months. The goal of injection is to keep platelet count greater than 50,000 He will get darbepoetin injection to keep hemoglobin greater than 10  History of lymphoma Prior cytology from thoracentesis excluded malignancy  His last PET CT scan show abnormalities that are related to pleural effusion. Clinically, he is doing well.  I recommend observation only.  Cough, persistent He has intermittent chronic cough for over a year. He is diagnosed with acute bronchitis and is prescribed azithromycin with improvement of his symptoms I recommend he continues the same. The mild worsening pancytopenia noted this week is likely due to bronchitis.   No orders of the defined types were placed in this encounter.  All questions were answered. The patient knows to call the clinic with any problems, questions or concerns. No barriers to learning was detected. I spent 15 minutes counseling the patient face to face. The total time spent in the appointment was 20 minutes and more than 50% was on counseling and review of test results     Heath Lark, MD 11/25/2016 1:27 PM

## 2016-11-25 NOTE — Assessment & Plan Note (Signed)
He has intermittent chronic cough for over a year. He is diagnosed with acute bronchitis and is prescribed azithromycin with improvement of his symptoms I recommend he continues the same. The mild worsening pancytopenia noted this week is likely due to bronchitis.

## 2016-11-25 NOTE — Patient Instructions (Signed)
Romiplostim injection What is this medicine? ROMIPLOSTIM (roe mi PLOE stim) helps your body make more platelets. This medicine is used to treat low platelets caused by chronic idiopathic thrombocytopenic purpura (ITP). This medicine may be used for other purposes; ask your health care provider or pharmacist if you have questions. COMMON BRAND NAME(S): Nplate What should I tell my health care provider before I take this medicine? They need to know if you have any of these conditions: -cancer or myelodysplastic syndrome -low blood counts, like low white cell, platelet, or red cell counts -take medicines that treat or prevent blood clots -an unusual or allergic reaction to romiplostim, mannitol, other medicines, foods, dyes, or preservatives -pregnant or trying to get pregnant -breast-feeding How should I use this medicine? This medicine is for injection under the skin. It is given by a health care professional in a hospital or clinic setting. A special MedGuide will be given to you before your injection. Read this information carefully each time. Talk to your pediatrician regarding the use of this medicine in children. Special care may be needed. Overdosage: If you think you have taken too much of this medicine contact a poison control center or emergency room at once. NOTE: This medicine is only for you. Do not share this medicine with others. What if I miss a dose? It is important not to miss your dose. Call your doctor or health care professional if you are unable to keep an appointment. What may interact with this medicine? Interactions are not expected. This list may not describe all possible interactions. Give your health care provider a list of all the medicines, herbs, non-prescription drugs, or dietary supplements you use. Also tell them if you smoke, drink alcohol, or use illegal drugs. Some items may interact with your medicine. What should I watch for while using this  medicine? Your condition will be monitored carefully while you are receiving this medicine. Visit your prescriber or health care professional for regular checks on your progress and for the needed blood tests. It is important to keep all appointments. What side effects may I notice from receiving this medicine? Side effects that you should report to your doctor or health care professional as soon as possible: -allergic reactions like skin rash, itching or hives, swelling of the face, lips, or tongue -shortness of breath, chest pain, swelling in a leg -unusual bleeding or bruising Side effects that usually do not require medical attention (report to your doctor or health care professional if they continue or are bothersome): -dizziness -headache -muscle aches -pain in arms and legs -stomach pain -trouble sleeping This list may not describe all possible side effects. Call your doctor for medical advice about side effects. You may report side effects to FDA at 1-800-FDA-1088. Where should I keep my medicine? This drug is given in a hospital or clinic and will not be stored at home. NOTE: This sheet is a summary. It may not cover all possible information. If you have questions about this medicine, talk to your doctor, pharmacist, or health care provider.  2018 Elsevier/Gold Standard (2008-02-05 15:13:04) Cyanocobalamin, Vitamin B12 injection What is this medicine? CYANOCOBALAMIN (sye an oh koe BAL a min) is a man made form of vitamin B12. Vitamin B12 is used in the growth of healthy blood cells, nerve cells, and proteins in the body. It also helps with the metabolism of fats and carbohydrates. This medicine is used to treat people who can not absorb vitamin B12. This medicine may be used   for other purposes; ask your health care provider or pharmacist if you have questions. COMMON BRAND NAME(S): B-12 Compliance Kit, B-12 Injection Kit, Cyomin, LA-12, Nutri-Twelve, Physicians EZ Use B-12,  Primabalt What should I tell my health care provider before I take this medicine? They need to know if you have any of these conditions: -kidney disease -Leber's disease -megaloblastic anemia -an unusual or allergic reaction to cyanocobalamin, cobalt, other medicines, foods, dyes, or preservatives -pregnant or trying to get pregnant -breast-feeding How should I use this medicine? This medicine is injected into a muscle or deeply under the skin. It is usually given by a health care professional in a clinic or doctor's office. However, your doctor may teach you how to inject yourself. Follow all instructions. Talk to your pediatrician regarding the use of this medicine in children. Special care may be needed. Overdosage: If you think you have taken too much of this medicine contact a poison control center or emergency room at once. NOTE: This medicine is only for you. Do not share this medicine with others. What if I miss a dose? If you are given your dose at a clinic or doctor's office, call to reschedule your appointment. If you give your own injections and you miss a dose, take it as soon as you can. If it is almost time for your next dose, take only that dose. Do not take double or extra doses. What may interact with this medicine? -colchicine -heavy alcohol intake This list may not describe all possible interactions. Give your health care provider a list of all the medicines, herbs, non-prescription drugs, or dietary supplements you use. Also tell them if you smoke, drink alcohol, or use illegal drugs. Some items may interact with your medicine. What should I watch for while using this medicine? Visit your doctor or health care professional regularly. You may need blood work done while you are taking this medicine. You may need to follow a special diet. Talk to your doctor. Limit your alcohol intake and avoid smoking to get the best benefit. What side effects may I notice from receiving  this medicine? Side effects that you should report to your doctor or health care professional as soon as possible: -allergic reactions like skin rash, itching or hives, swelling of the face, lips, or tongue -blue tint to skin -chest tightness, pain -difficulty breathing, wheezing -dizziness -red, swollen painful area on the leg Side effects that usually do not require medical attention (report to your doctor or health care professional if they continue or are bothersome): -diarrhea -headache This list may not describe all possible side effects. Call your doctor for medical advice about side effects. You may report side effects to FDA at 1-800-FDA-1088. Where should I keep my medicine? Keep out of the reach of children. Store at room temperature between 15 and 30 degrees C (59 and 85 degrees F). Protect from light. Throw away any unused medicine after the expiration date. NOTE: This sheet is a summary. It may not cover all possible information. If you have questions about this medicine, talk to your doctor, pharmacist, or health care provider.  2018 Elsevier/Gold Standard (2007-09-18 22:10:20)

## 2016-11-25 NOTE — Telephone Encounter (Signed)
Weekly labs and injection scheduled for 6 months, per 11/25/16 los. Follow up with Dr [name] scheduled for 6 months, same day as lab and injection, per 11/25/16 los. Patient was given a copy of the AVS report and appointment schedule, per 11/25/16 los.

## 2016-12-02 ENCOUNTER — Ambulatory Visit (HOSPITAL_BASED_OUTPATIENT_CLINIC_OR_DEPARTMENT_OTHER): Payer: Medicare Other

## 2016-12-02 ENCOUNTER — Other Ambulatory Visit (HOSPITAL_BASED_OUTPATIENT_CLINIC_OR_DEPARTMENT_OTHER): Payer: Medicare Other

## 2016-12-02 VITALS — BP 148/59 | HR 67 | Temp 97.4°F | Resp 20

## 2016-12-02 DIAGNOSIS — D693 Immune thrombocytopenic purpura: Secondary | ICD-10-CM | POA: Diagnosis present

## 2016-12-02 DIAGNOSIS — N183 Chronic kidney disease, stage 3 unspecified: Secondary | ICD-10-CM

## 2016-12-02 DIAGNOSIS — D46C Myelodysplastic syndrome with isolated del(5q) chromosomal abnormality: Secondary | ICD-10-CM

## 2016-12-02 DIAGNOSIS — D696 Thrombocytopenia, unspecified: Secondary | ICD-10-CM

## 2016-12-02 DIAGNOSIS — D539 Nutritional anemia, unspecified: Secondary | ICD-10-CM

## 2016-12-02 DIAGNOSIS — D631 Anemia in chronic kidney disease: Secondary | ICD-10-CM

## 2016-12-02 LAB — CBC WITH DIFFERENTIAL/PLATELET
BASO%: 0.2 % (ref 0.0–2.0)
Basophils Absolute: 0 10*3/uL (ref 0.0–0.1)
EOS%: 0.9 % (ref 0.0–7.0)
Eosinophils Absolute: 0 10*3/uL (ref 0.0–0.5)
HCT: 35.7 % — ABNORMAL LOW (ref 38.4–49.9)
HGB: 10.9 g/dL — ABNORMAL LOW (ref 13.0–17.1)
LYMPH%: 42.2 % (ref 14.0–49.0)
MCH: 24.4 pg — ABNORMAL LOW (ref 27.2–33.4)
MCHC: 30.5 g/dL — AB (ref 32.0–36.0)
MCV: 80 fL (ref 79.3–98.0)
MONO#: 0.5 10*3/uL (ref 0.1–0.9)
MONO%: 9.6 % (ref 0.0–14.0)
NEUT%: 47.1 % (ref 39.0–75.0)
NEUTROS ABS: 2.2 10*3/uL (ref 1.5–6.5)
NRBC: 0 % (ref 0–0)
PLATELETS: 50 10*3/uL — AB (ref 140–400)
RBC: 4.46 10*6/uL (ref 4.20–5.82)
RDW: 22.9 % — AB (ref 11.0–14.6)
WBC: 4.7 10*3/uL (ref 4.0–10.3)
lymph#: 2 10*3/uL (ref 0.9–3.3)

## 2016-12-02 MED ORDER — ROMIPLOSTIM 250 MCG ~~LOC~~ SOLR
170.0000 ug | SUBCUTANEOUS | Status: DC
  Administered 2016-12-02: 170 ug via SUBCUTANEOUS
  Filled 2016-12-02: qty 0.34

## 2016-12-02 NOTE — Progress Notes (Signed)
Pt C/O bilateral weakness in legs. Pt able to feel touch, able to move legs.  Grips are equal and strong bilateral. Able to speak in normal voice, able to stick tongue out, no facial drooping noted. VSS. No difficulty breathing or chest pain.  Able to drink thin liquids without difficulty. Pt and family members  Instructed to  Call EMS at once if change in speech, facial dropping or sudden inability to move extremities. Pt and family member verbalized understanding of instructions

## 2016-12-02 NOTE — Patient Instructions (Signed)
Romiplostim injection What is this medicine? ROMIPLOSTIM (roe mi PLOE stim) helps your body make more platelets. This medicine is used to treat low platelets caused by chronic idiopathic thrombocytopenic purpura (ITP). This medicine may be used for other purposes; ask your health care provider or pharmacist if you have questions. COMMON BRAND NAME(S): Nplate What should I tell my health care provider before I take this medicine? They need to know if you have any of these conditions: -cancer or myelodysplastic syndrome -low blood counts, like low white cell, platelet, or red cell counts -take medicines that treat or prevent blood clots -an unusual or allergic reaction to romiplostim, mannitol, other medicines, foods, dyes, or preservatives -pregnant or trying to get pregnant -breast-feeding How should I use this medicine? This medicine is for injection under the skin. It is given by a health care professional in a hospital or clinic setting. A special MedGuide will be given to you before your injection. Read this information carefully each time. Talk to your pediatrician regarding the use of this medicine in children. Special care may be needed. Overdosage: If you think you have taken too much of this medicine contact a poison control center or emergency room at once. NOTE: This medicine is only for you. Do not share this medicine with others. What if I miss a dose? It is important not to miss your dose. Call your doctor or health care professional if you are unable to keep an appointment. What may interact with this medicine? Interactions are not expected. This list may not describe all possible interactions. Give your health care provider a list of all the medicines, herbs, non-prescription drugs, or dietary supplements you use. Also tell them if you smoke, drink alcohol, or use illegal drugs. Some items may interact with your medicine. What should I watch for while using this  medicine? Your condition will be monitored carefully while you are receiving this medicine. Visit your prescriber or health care professional for regular checks on your progress and for the needed blood tests. It is important to keep all appointments. What side effects may I notice from receiving this medicine? Side effects that you should report to your doctor or health care professional as soon as possible: -allergic reactions like skin rash, itching or hives, swelling of the face, lips, or tongue -shortness of breath, chest pain, swelling in a leg -unusual bleeding or bruising Side effects that usually do not require medical attention (report to your doctor or health care professional if they continue or are bothersome): -dizziness -headache -muscle aches -pain in arms and legs -stomach pain -trouble sleeping This list may not describe all possible side effects. Call your doctor for medical advice about side effects. You may report side effects to FDA at 1-800-FDA-1088. Where should I keep my medicine? This drug is given in a hospital or clinic and will not be stored at home. NOTE: This sheet is a summary. It may not cover all possible information. If you have questions about this medicine, talk to your doctor, pharmacist, or health care provider.  2018 Elsevier/Gold Standard (2008-02-05 15:13:04)  

## 2016-12-06 ENCOUNTER — Encounter: Payer: Self-pay | Admitting: Family Medicine

## 2016-12-06 ENCOUNTER — Ambulatory Visit (INDEPENDENT_AMBULATORY_CARE_PROVIDER_SITE_OTHER): Payer: Medicare Other | Admitting: Family Medicine

## 2016-12-06 VITALS — BP 118/50 | HR 60 | Temp 97.6°F | Resp 18 | Ht 69.0 in | Wt 182.0 lb

## 2016-12-06 DIAGNOSIS — E1165 Type 2 diabetes mellitus with hyperglycemia: Secondary | ICD-10-CM | POA: Diagnosis not present

## 2016-12-06 DIAGNOSIS — Z794 Long term (current) use of insulin: Secondary | ICD-10-CM

## 2016-12-06 MED ORDER — GLUCOSE BLOOD VI STRP: ORAL_STRIP | 3 refills | Status: DC

## 2016-12-06 NOTE — Progress Notes (Signed)
Subjective:    Patient ID: Ricardo Watkins, male    DOB: 09/23/1930, 83 y.o.   MRN: [id number]  HPI Patient has a history of insulin-dependent diabetes mellitus. He is currently on Lantus 15 units once daily. Recently, his blood sugars have been running high. Fasting blood sugars between 160 and 200. Two-hour postprandial sugars are typically between 200-300. He denies any hypoglycemic episodes.  Past history is complicated by a myelodysplastic syndrome, coronary artery disease, and frail state. Past Medical History:  Diagnosis Date  . Adenomatous colon polyp   . CAD (coronary artery disease)    30% LAD Stenosis, 70% ramus intermedius stenosis, treated with PTCA and angioplasty by Dr Albertine Patricia 2004  . Cataract    right eye  . Cough, persistent 11/04/2015  . Deficiency anemia 04/19/2014  . Diverticulosis   . DM (diabetes mellitus) ([place])   . DVT (deep venous thrombosis) ([place])    secondary to surgery  . Dyslipidemia   . Hyperlipidemia   . Hypertension   . Inguinal hernia    right  . 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 Microcytic anemia 07/07/2015  . 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 Non Hodgkin's lymphoma ([place])   . Pleural effusion, left 11/04/2015  . 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 PVD (peripheral vascular disease) ([place])    rt renal artery stent  . Renal insufficiency   . Thrombocytopenia ([place])   . Thrombotic stroke ([place]) 09/10/2011   January 18, 2011 infarct genu & post limb R internal capsule - acute; previous lacunar infarcts/extensive white matter dis  . Vitamin D deficiency    Past Surgical History:  Procedure Laterality Date  . APPENDECTOMY     patient ?  Marland Kitchen CARDIAC CATHETERIZATION    . CHEST TUBE INSERTION Left 02/10/2016   Procedure: INSERTION PLEURAL DRAINAGE CATHETER;  Surgeon: [name], MD;  Location: [facility];  Service: Thoracic;  Laterality: Left;    . CHEST TUBE INSERTION Left 04/08/2016   Procedure: CHEST TUBE INSERTION;  Surgeon: [name], MD;  Location: [facility];  Service: Thoracic;  Laterality: Left;  . COLONOSCOPY    . CORONARY ANGIOPLASTY    . CYSTOURETHROSCOPY     ROBOTIC ARM NUCLETRON SEED IMPLANTATION OF PROSTATE  . EXPLORATORY LAPAROTOMY     For evaluation of lymphoma  . REMOVAL OF PLEURAL DRAINAGE CATHETER Left 04/08/2016   Procedure: REMOVAL OF PLEURAL DRAINAGE CATHETER;  Surgeon: [name], MD;  Location: [facility] OR;  Service: Thoracic;  Laterality: Left;   Current Outpatient Prescriptions on File Prior to Visit  Medication Sig Dispense Refill  . acetaminophen (TYLENOL) 500 MG tablet Take 1,000 mg by mouth every 6 (six) hours as needed for mild pain, moderate pain or headache.     . clopidogrel (PLAVIX) 75 MG tablet TAKE 1 TABLET BY MOUTH ONCE A DAY WITH BREAKFAST 90 tablet 1  . cyanocobalamin (,VITAMIN B-12,) 1000 MCG/ML injection Inject 1 mL (1,000 mcg total) into the muscle every 30 (thirty) days. 10 mL 3  . Dextromethorphan Polistirex (DELSYM PO) Take 15 mLs by mouth at bedtime.    . furosemide (LASIX) 40 MG tablet TAKE 1 TABLET BY MOUTH ONCE A DAY 90 tablet 3  . hydrocortisone cream 1 % Apply 1 application topically at bedtime as needed for itching.    . Insulin Glargine (LANTUS  SOLOSTAR) 100 UNIT/ML Solostar Pen INJECT 25 UNITS EVERY NIGHT AT BEDTIME (Patient taking differently: 15 Units. INJECT 25 UNITS EVERY NIGHT AT BEDTIME) 5 pen 3  . meclizine (ANTIVERT) 12.5 MG tablet Take 1 tablet (12.5 mg total) by mouth 3 (three) times daily as needed for dizziness. 30 tablet 0  . metoprolol succinate (TOPROL-XL) 100 MG 24 hr tablet TAKE 1 TABLET BY MOUTH ONCE A DAY 90 tablet 3  . Multiple Vitamin (MULTIVITAMIN WITH MINERALS) TABS tablet Take 1 tablet by mouth at bedtime.    Marland Kitchen OVER THE COUNTER MEDICATION Place 1 drop into both eyes daily as needed (dry eyes). Over the counter lubricating eye drop    . SYRINGE-NEEDLE,  DISP, 3 ML (LUER LOCK SAFETY SYRINGES) 25G X 1" 3 ML MISC Use with B 12 injections q month 100 each 3  . tamsulosin (FLOMAX) 0.4 MG CAPS capsule TAKE 1 CAPSULE BY MOUTH ONCE DAILY 90 capsule 3   Current Facility-Administered Medications on File Prior to Visit  Medication Dose Route Frequency Provider Last Rate Last Dose  . cyanocobalamin ((VITAMIN B-12)) injection 1,000 mcg  1,000 mcg Subcutaneous Q30 days [name], MD   1,000 mcg at 07/06/16 1430  . romiPLOStim (NPLATE) injection 170 mcg  170 mcg Subcutaneous Weekly [name], Ni, MD   170 mcg at 11/25/16 1345   Allergies  Allergen Reactions  . Glucophage [Metformin Hydrochloride] Other (See Comments)    Chest pain  . Zetia [Ezetimibe] Other (See Comments)    weakness  . Fenofibrate Rash  . Niacin-Lovastatin Er Rash   Social History   Social History  . Marital status: Married    Spouse name: N/A  . Number of children: 4  . Years of education: N/A   Occupational History  . retired    Social History Main Topics  . Smoking status: Former Smoker    Packs/day: 0.50    Years: 20.00    Types: Pipe, Cigarettes    Quit date: 06/21/1958  . Smokeless tobacco: Never Used  . Alcohol use No  . Drug use: No  . Sexual activity: Yes    Partners: Female   Other Topics Concern  . Not on file   Social History Narrative  . No narrative on file      Review of Systems  All other systems reviewed and are negative.      Objective:   Physical Exam  Constitutional: He appears well-developed and well-nourished. No distress.  Cardiovascular: Normal rate and normal heart sounds.   Pulmonary/Chest: Effort normal and breath sounds normal. No respiratory distress. He has no wheezes. He has no rales.  Abdominal: Soft. Bowel sounds are normal.  Skin: He is not diaphoretic.  Vitals reviewed.         Assessment & Plan:  Type 2 diabetes mellitus with hyperglycemia, with long-term current use of insulin (HCC)  Increase Lantus to  22 units subcutaneous every morning. Recheck fasting blood sugars and two-hour postprandial sugars in one week. I would increase Lantus until fasting blood sugar less than 130. I would then address two-hour postprandial sugars if necessary. I would rather the patient runs slightly high than run the risk of hypoglycemia in this elderly frail patient

## 2016-12-07 ENCOUNTER — Ambulatory Visit: Payer: Medicare Other | Admitting: Cardiology

## 2016-12-09 ENCOUNTER — Ambulatory Visit (HOSPITAL_BASED_OUTPATIENT_CLINIC_OR_DEPARTMENT_OTHER): Payer: Medicare Other

## 2016-12-09 ENCOUNTER — Other Ambulatory Visit (HOSPITAL_BASED_OUTPATIENT_CLINIC_OR_DEPARTMENT_OTHER): Payer: Medicare Other

## 2016-12-09 VITALS — BP 152/62 | HR 63 | Temp 96.8°F | Resp 16

## 2016-12-09 DIAGNOSIS — D693 Immune thrombocytopenic purpura: Secondary | ICD-10-CM

## 2016-12-09 DIAGNOSIS — D539 Nutritional anemia, unspecified: Secondary | ICD-10-CM

## 2016-12-09 DIAGNOSIS — N183 Chronic kidney disease, stage 3 unspecified: Secondary | ICD-10-CM

## 2016-12-09 DIAGNOSIS — D631 Anemia in chronic kidney disease: Secondary | ICD-10-CM

## 2016-12-09 DIAGNOSIS — D696 Thrombocytopenia, unspecified: Secondary | ICD-10-CM

## 2016-12-09 DIAGNOSIS — D46C Myelodysplastic syndrome with isolated del(5q) chromosomal abnormality: Secondary | ICD-10-CM

## 2016-12-09 LAB — CBC WITH DIFFERENTIAL/PLATELET
BASO%: 0 % (ref 0.0–2.0)
BASOS ABS: 0 10*3/uL (ref 0.0–0.1)
EOS%: 1.4 % (ref 0.0–7.0)
Eosinophils Absolute: 0 10*3/uL (ref 0.0–0.5)
HCT: 34.2 % — ABNORMAL LOW (ref 38.4–49.9)
HGB: 10.3 g/dL — ABNORMAL LOW (ref 13.0–17.1)
LYMPH%: 57.4 % — ABNORMAL HIGH (ref 14.0–49.0)
MCH: 24.3 pg — AB (ref 27.2–33.4)
MCHC: 30.1 g/dL — ABNORMAL LOW (ref 32.0–36.0)
MCV: 80.7 fL (ref 79.3–98.0)
MONO#: 0.4 10*3/uL (ref 0.1–0.9)
MONO%: 13.9 % (ref 0.0–14.0)
NEUT#: 0.8 10*3/uL — ABNORMAL LOW (ref 1.5–6.5)
NEUT%: 27.3 % — AB (ref 39.0–75.0)
NRBC: 2 % — AB (ref 0–0)
Platelets: 58 10*3/uL — ABNORMAL LOW (ref 140–400)
RBC: 4.24 10*6/uL (ref 4.20–5.82)
RDW: 23.3 % — AB (ref 11.0–14.6)
WBC: 3 10*3/uL — ABNORMAL LOW (ref 4.0–10.3)
lymph#: 1.7 10*3/uL (ref 0.9–3.3)

## 2016-12-09 LAB — TECHNOLOGIST REVIEW

## 2016-12-09 MED ORDER — ROMIPLOSTIM 250 MCG ~~LOC~~ SOLR
170.0000 ug | SUBCUTANEOUS | Status: DC
  Administered 2016-12-09: 170 ug via SUBCUTANEOUS
  Filled 2016-12-09: qty 0.34

## 2016-12-09 NOTE — Progress Notes (Signed)
HPI  The patient presents for followup of his known coronary disease. He has a history of non-Hodgkin's lymphoma.  He has had PleurX catheters secondary to chronic non malignant pleural effusions.   Since I last saw him he has done extremely well.  According to Onc notes he has had a "remarkable" response to therapy.  He is eating well.  The patient denies any new symptoms such as chest discomfort, neck or arm discomfort. There has been no new shortness of breath, PND or orthopnea. There have been no reported palpitations, presyncope or syncope.  Allergies  Allergen Reactions  . Glucophage [Metformin Hydrochloride] Other (See Comments)    Chest pain  . Zetia [Ezetimibe] Other (See Comments)    weakness  . Fenofibrate Rash  . Niacin-Lovastatin Er Rash    Current Outpatient Prescriptions  Medication Sig Dispense Refill  . acetaminophen (TYLENOL) 500 MG tablet Take 1,000 mg by mouth every 6 (six) hours as needed for mild pain, moderate pain or headache.     . clopidogrel (PLAVIX) 75 MG tablet TAKE 1 TABLET BY MOUTH ONCE A DAY WITH BREAKFAST 90 tablet 1  . cyanocobalamin (,VITAMIN B-12,) 1000 MCG/ML injection Inject 1 mL (1,000 mcg total) into the muscle every 30 (thirty) days. 10 mL 3  . Dextromethorphan Polistirex (DELSYM PO) Take 15 mLs by mouth 3 times/day as needed-between meals & bedtime (cough).     . furosemide (LASIX) 40 MG tablet TAKE 1 TABLET BY MOUTH ONCE A DAY 90 tablet 3  . glucose blood test strip Check BS TID 300 each 3  . hydrocortisone cream 1 % Apply 1 application topically at bedtime as needed for itching.    . Insulin Glargine (LANTUS SOLOSTAR) 100 UNIT/ML Solostar Pen INJECT 25 UNITS EVERY NIGHT AT BEDTIME (Patient taking differently: 15 Units. INJECT 25 UNITS EVERY NIGHT AT BEDTIME) 5 pen 3  . meclizine (ANTIVERT) 12.5 MG tablet Take 1 tablet (12.5 mg total) by mouth 3 (three) times daily as needed for dizziness. 30 tablet 0  . metoprolol succinate (TOPROL-XL) 100 MG  24 hr tablet TAKE 1 TABLET BY MOUTH ONCE A DAY 90 tablet 3  . Multiple Vitamin (MULTIVITAMIN WITH MINERALS) TABS tablet Take 1 tablet by mouth at bedtime.    Marland Kitchen OVER THE COUNTER MEDICATION Place 1 drop into both eyes daily as needed (dry eyes). Over the counter lubricating eye drop    . SYRINGE-NEEDLE, DISP, 3 ML (LUER LOCK SAFETY SYRINGES) 25G X 1" 3 ML MISC Use with B 12 injections q month 100 each 3  . tamsulosin (FLOMAX) 0.4 MG CAPS capsule TAKE 1 CAPSULE BY MOUTH ONCE DAILY 90 capsule 3   Current Facility-Administered Medications  Medication Dose Route Frequency Provider Last Rate Last Dose  . cyanocobalamin ((VITAMIN B-12)) injection 1,000 mcg  1,000 mcg Subcutaneous Q30 days [name], MD   1,000 mcg at 07/06/16 1430   Facility-Administered Medications Ordered in Other Visits  Medication Dose Route Frequency Provider Last Rate Last Dose  . romiPLOStim (NPLATE) injection 170 mcg  170 mcg Subcutaneous Weekly Heath Lark, MD   170 mcg at 11/25/16 1345    Past Medical History:  Diagnosis Date  . Adenomatous colon polyp   . CAD (coronary artery disease)    30% LAD Stenosis, 70% ramus intermedius stenosis, treated with PTCA and angioplasty by Dr Albertine Patricia 2004  . Cataract    right eye  . Cough, persistent 11/04/2015  . Deficiency anemia 04/19/2014  . Diverticulosis   .  DM (diabetes mellitus) ([place])   . DVT (deep venous thrombosis) ([place])    secondary to surgery  . Dyslipidemia   . Hyperlipidemia   . Hypertension   . Inguinal hernia    right  . 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 Microcytic anemia 07/07/2015  . 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 Non Hodgkin's lymphoma ([place])   . Pleural effusion, left 11/04/2015  . 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 PVD (peripheral vascular disease) ([place])    rt renal artery stent  . Renal insufficiency   . Thrombocytopenia  ([place])   . Thrombotic stroke ([place]) 09/10/2011   January 18, 2011 infarct genu & post limb R internal capsule - acute; previous lacunar infarcts/extensive white matter dis  . Vitamin D deficiency     Past Surgical History:  Procedure Laterality Date  . APPENDECTOMY     patient ?  Marland Kitchen CARDIAC CATHETERIZATION    . CHEST TUBE INSERTION Left 02/10/2016   Procedure: INSERTION PLEURAL DRAINAGE CATHETER;  Surgeon: [name], MD;  Location: [facility];  Service: Thoracic;  Laterality: Left;  . CHEST TUBE INSERTION Left 04/08/2016   Procedure: CHEST TUBE INSERTION;  Surgeon: [name], MD;  Location: [facility];  Service: Thoracic;  Laterality: Left;  . COLONOSCOPY    . CORONARY ANGIOPLASTY    . CYSTOURETHROSCOPY     ROBOTIC ARM NUCLETRON SEED IMPLANTATION OF PROSTATE  . EXPLORATORY LAPAROTOMY     For evaluation of lymphoma  . REMOVAL OF PLEURAL DRAINAGE CATHETER Left 04/08/2016   Procedure: REMOVAL OF PLEURAL DRAINAGE CATHETER;  Surgeon: [name], MD;  Location: [facility];  Service: Thoracic;  Laterality: Left;    ROS:  As stated in the HPI and negative for all other systems.  PHYSICAL EXAM BP (!) 149/78   Pulse 81   Ht 5\' 9"  (1.753 m)   Wt 183 lb (83 kg)   BMI 27.02 kg/m   GENERAL:  Well appearing for all that he has been through NECK:  No jugular venous distention, waveform within normal limits, carotid upstroke brisk and symmetric, no bruits, no thyromegaly LUNGS:  Clear to auscultation bilaterally BACK:  No CVA tenderness CHEST:  Unremarkable HEART:  PMI not displaced or sustained,S1 and S2 within normal limits, no S3, no S4, no clicks, no rubs, 2/6 apical systolic murmur without radiation, no diastoic murmurs ABD:  Flat, positive bowel sounds normal in frequency in pitch, no bruits, no rebound, no guarding, no midline pulsatile mass, no hepatomegaly, no splenomegaly EXT:  2 plus pulses throughout, mild left leg edema, no cyanosis no clubbing   Lab Results  Component Value Date     CHOL 119 (L) 07/02/2015   TRIG 248 (H) 07/02/2015   HDL 19 (L) 07/02/2015   LDLCALC 50 07/02/2015   Lab Results  Component Value Date   CREATININE 1.54 (H) 06/24/2016    ASSESSMENT AND PLAN  CAD:   The patient has no new sypmtoms.  No further cardiovascular testing is indicated.  We will continue with aggressive risk reduction and meds as listed.  CAROTID STENOSIS:  This was stable in Dec with  1-39% RICA stenosis and stable 36-62% LICA stenosis. He will have follow up in Dec.   HYPERLIPIDMEIA:  I will have him come back for a repeat lipid level.  AS:  This was  mild on echo in October.  I will follow this clinically.  CKD:  Creat was as above.  No change in therapy.  EDEMA:   This is baseline.  No change in therapy.   PLEURAL EFFUSION:   He was followed by [name] who does not need to see him again as it seems to have resolved.

## 2016-12-09 NOTE — Progress Notes (Signed)
[name] reviewed CBC results today  12/09/16.   No Aranesp, but pt will need Nplate injection as per [name].

## 2016-12-09 NOTE — Patient Instructions (Signed)
Romiplostim injection What is this medicine? ROMIPLOSTIM (roe mi PLOE stim) helps your body make more platelets. This medicine is used to treat low platelets caused by chronic idiopathic thrombocytopenic purpura (ITP). This medicine may be used for other purposes; ask your health care provider or pharmacist if you have questions. COMMON BRAND NAME(S): Nplate What should I tell my health care provider before I take this medicine? They need to know if you have any of these conditions: -cancer or myelodysplastic syndrome -low blood counts, like low white cell, platelet, or red cell counts -take medicines that treat or prevent blood clots -an unusual or allergic reaction to romiplostim, mannitol, other medicines, foods, dyes, or preservatives -pregnant or trying to get pregnant -breast-feeding How should I use this medicine? This medicine is for injection under the skin. It is given by a health care professional in a hospital or clinic setting. A special MedGuide will be given to you before your injection. Read this information carefully each time. Talk to your pediatrician regarding the use of this medicine in children. Special care may be needed. Overdosage: If you think you have taken too much of this medicine contact a poison control center or emergency room at once. NOTE: This medicine is only for you. Do not share this medicine with others. What if I miss a dose? It is important not to miss your dose. Call your doctor or health care professional if you are unable to keep an appointment. What may interact with this medicine? Interactions are not expected. This list may not describe all possible interactions. Give your health care provider a list of all the medicines, herbs, non-prescription drugs, or dietary supplements you use. Also tell them if you smoke, drink alcohol, or use illegal drugs. Some items may interact with your medicine. What should I watch for while using this  medicine? Your condition will be monitored carefully while you are receiving this medicine. Visit your prescriber or health care professional for regular checks on your progress and for the needed blood tests. It is important to keep all appointments. What side effects may I notice from receiving this medicine? Side effects that you should report to your doctor or health care professional as soon as possible: -allergic reactions like skin rash, itching or hives, swelling of the face, lips, or tongue -shortness of breath, chest pain, swelling in a leg -unusual bleeding or bruising Side effects that usually do not require medical attention (report to your doctor or health care professional if they continue or are bothersome): -dizziness -headache -muscle aches -pain in arms and legs -stomach pain -trouble sleeping This list may not describe all possible side effects. Call your doctor for medical advice about side effects. You may report side effects to FDA at 1-800-FDA-1088. Where should I keep my medicine? This drug is given in a hospital or clinic and will not be stored at home. NOTE: This sheet is a summary. It may not cover all possible information. If you have questions about this medicine, talk to your doctor, pharmacist, or health care provider.  2018 Elsevier/Gold Standard (2008-02-05 15:13:04)  

## 2016-12-10 ENCOUNTER — Encounter: Payer: Self-pay | Admitting: Cardiology

## 2016-12-10 ENCOUNTER — Ambulatory Visit (INDEPENDENT_AMBULATORY_CARE_PROVIDER_SITE_OTHER): Payer: Medicare Other | Admitting: Cardiology

## 2016-12-10 ENCOUNTER — Telehealth: Payer: Self-pay | Admitting: Family Medicine

## 2016-12-10 VITALS — BP 149/78 | HR 81 | Ht 69.0 in | Wt 183.0 lb

## 2016-12-10 DIAGNOSIS — E785 Hyperlipidemia, unspecified: Secondary | ICD-10-CM

## 2016-12-10 DIAGNOSIS — I2510 Atherosclerotic heart disease of native coronary artery without angina pectoris: Secondary | ICD-10-CM | POA: Diagnosis not present

## 2016-12-10 DIAGNOSIS — I6523 Occlusion and stenosis of bilateral carotid arteries: Secondary | ICD-10-CM

## 2016-12-10 NOTE — Telephone Encounter (Signed)
Calling regarding sugar readings please call back at [phone]

## 2016-12-10 NOTE — Patient Instructions (Addendum)
Medication Instructions:  Continue current medications  Labwork: Fasting Lipids  Testing/Procedures: None Ordered  Follow-Up: Your physician recommends that you schedule a follow-up appointment in: 6 Months with Lurena Joiner or Suanne Marker   Any Other Special Instructions Will Be Listed Below (If Applicable).   If you need a refill on your cardiac medications before your next appointment, please call your pharmacy.

## 2016-12-11 ENCOUNTER — Encounter: Payer: Self-pay | Admitting: Cardiology

## 2016-12-11 DIAGNOSIS — E785 Hyperlipidemia, unspecified: Secondary | ICD-10-CM | POA: Insufficient documentation

## 2016-12-11 DIAGNOSIS — I6523 Occlusion and stenosis of bilateral carotid arteries: Secondary | ICD-10-CM | POA: Insufficient documentation

## 2016-12-14 NOTE — Telephone Encounter (Signed)
Pt's wife called back and his averages are as follows: FBS - 105-115 - 2 pp lunch - 288 - 2 pp supper - 300

## 2016-12-14 NOTE — Telephone Encounter (Signed)
Continue the same dose of insulin until I see lab results He needs an A1C and CMET He does not need to be fasting, need this week if possible

## 2016-12-15 ENCOUNTER — Other Ambulatory Visit: Payer: Medicare Other

## 2016-12-15 ENCOUNTER — Other Ambulatory Visit: Payer: Self-pay | Admitting: Family Medicine

## 2016-12-15 ENCOUNTER — Telehealth: Payer: Self-pay | Admitting: Cardiology

## 2016-12-15 DIAGNOSIS — E785 Hyperlipidemia, unspecified: Secondary | ICD-10-CM | POA: Diagnosis not present

## 2016-12-15 DIAGNOSIS — I2510 Atherosclerotic heart disease of native coronary artery without angina pectoris: Secondary | ICD-10-CM | POA: Diagnosis not present

## 2016-12-15 DIAGNOSIS — I10 Essential (primary) hypertension: Secondary | ICD-10-CM | POA: Diagnosis not present

## 2016-12-15 DIAGNOSIS — N183 Chronic kidney disease, stage 3 unspecified: Secondary | ICD-10-CM

## 2016-12-15 DIAGNOSIS — E119 Type 2 diabetes mellitus without complications: Secondary | ICD-10-CM

## 2016-12-15 NOTE — Telephone Encounter (Signed)
Per pt he had eggs, bacon and 1 piece of toast with coffee with cream and artifical sweetener at 7 am and blood is being drawn at 11:37

## 2016-12-15 NOTE — Telephone Encounter (Signed)
Please call asap,pt is coming in at 11:00

## 2016-12-15 NOTE — Telephone Encounter (Signed)
Ricardo Watkins wanted you to know he was coming there today at 11:00 for lab work,she is going ahead and draw his Lipids today. That way he will not have to wait tomorrow and have it drawn at his Oncologist..

## 2016-12-15 NOTE — Telephone Encounter (Signed)
Noted-received message

## 2016-12-15 NOTE — Telephone Encounter (Signed)
Called and spoke to wife and she will bring him with her to her appt. Also she wanted to know if we could do his lipid panel here that Dr. [name] wanted him to do at oncology so he would not have to get stuck so much. Called and spoke to [place] who asked the triage nurse if it was ok he was not fasting as the wife had stated MD told her it would be fine to eat a light lunch and have lipid panel done at appt. This was confirmed with Sunday Spillers and we will do his lipid here as well so he does not have to get stuck so much. Will send results to Dr. [name].

## 2016-12-16 ENCOUNTER — Encounter: Payer: Self-pay | Admitting: Family Medicine

## 2016-12-16 ENCOUNTER — Other Ambulatory Visit (HOSPITAL_BASED_OUTPATIENT_CLINIC_OR_DEPARTMENT_OTHER): Payer: Medicare Other

## 2016-12-16 ENCOUNTER — Ambulatory Visit (HOSPITAL_BASED_OUTPATIENT_CLINIC_OR_DEPARTMENT_OTHER): Payer: Medicare Other

## 2016-12-16 VITALS — BP 154/66 | HR 63 | Temp 97.2°F | Resp 18

## 2016-12-16 DIAGNOSIS — D693 Immune thrombocytopenic purpura: Secondary | ICD-10-CM

## 2016-12-16 DIAGNOSIS — D696 Thrombocytopenia, unspecified: Secondary | ICD-10-CM

## 2016-12-16 DIAGNOSIS — N184 Chronic kidney disease, stage 4 (severe): Secondary | ICD-10-CM | POA: Insufficient documentation

## 2016-12-16 DIAGNOSIS — N183 Chronic kidney disease, stage 3 unspecified: Secondary | ICD-10-CM

## 2016-12-16 DIAGNOSIS — D539 Nutritional anemia, unspecified: Secondary | ICD-10-CM

## 2016-12-16 DIAGNOSIS — D631 Anemia in chronic kidney disease: Secondary | ICD-10-CM

## 2016-12-16 DIAGNOSIS — D46C Myelodysplastic syndrome with isolated del(5q) chromosomal abnormality: Secondary | ICD-10-CM

## 2016-12-16 LAB — COMPREHENSIVE METABOLIC PANEL WITH GFR
ALT: 14 U/L (ref 9–46)
AST: 15 U/L (ref 10–35)
Albumin: 3.6 g/dL (ref 3.6–5.1)
Alkaline Phosphatase: 64 U/L (ref 40–115)
BUN: 27 mg/dL — ABNORMAL HIGH (ref 7–25)
CO2: 23 mmol/L (ref 20–31)
Calcium: 8.5 mg/dL — ABNORMAL LOW (ref 8.6–10.3)
Chloride: 105 mmol/L (ref 98–110)
Creat: 1.72 mg/dL — ABNORMAL HIGH (ref 0.70–1.11)
Glucose, Bld: 166 mg/dL — ABNORMAL HIGH (ref 70–99)
Potassium: 4.7 mmol/L (ref 3.5–5.3)
Sodium: 138 mmol/L (ref 135–146)
Total Bilirubin: 0.4 mg/dL (ref 0.2–1.2)
Total Protein: 7.1 g/dL (ref 6.1–8.1)

## 2016-12-16 LAB — CBC WITH DIFFERENTIAL/PLATELET
BASO%: 0.3 % (ref 0.0–2.0)
Basophils Absolute: 0 10*3/uL (ref 0.0–0.1)
EOS%: 1.6 % (ref 0.0–7.0)
Eosinophils Absolute: 0.1 10*3/uL (ref 0.0–0.5)
HCT: 34.5 % — ABNORMAL LOW (ref 38.4–49.9)
HEMOGLOBIN: 10.4 g/dL — AB (ref 13.0–17.1)
LYMPH#: 1.7 10*3/uL (ref 0.9–3.3)
LYMPH%: 55.1 % — ABNORMAL HIGH (ref 14.0–49.0)
MCH: 24.1 pg — ABNORMAL LOW (ref 27.2–33.4)
MCHC: 30.1 g/dL — ABNORMAL LOW (ref 32.0–36.0)
MCV: 80 fL (ref 79.3–98.0)
MONO#: 0.4 10*3/uL (ref 0.1–0.9)
MONO%: 12.3 % (ref 0.0–14.0)
NEUT%: 30.7 % — ABNORMAL LOW (ref 39.0–75.0)
NEUTROS ABS: 1 10*3/uL — AB (ref 1.5–6.5)
NRBC: 0 % (ref 0–0)
Platelets: 79 10*3/uL — ABNORMAL LOW (ref 140–400)
RBC: 4.31 10*6/uL (ref 4.20–5.82)
RDW: 23.3 % — AB (ref 11.0–14.6)
WBC: 3.2 10*3/uL — ABNORMAL LOW (ref 4.0–10.3)

## 2016-12-16 LAB — LIPID PANEL
CHOL/HDL RATIO: 3.9 ratio (ref <5.0)
Cholesterol: 102 mg/dL (ref <200)
HDL: 26 mg/dL — AB (ref >40)
LDL CALC: 32 mg/dL (ref <100)
TRIGLYCERIDES: 220 mg/dL — AB (ref <150)
VLDL: 44 mg/dL — AB (ref <30)

## 2016-12-16 LAB — HEMOGLOBIN A1C
Hgb A1c MFr Bld: 9.1 % — ABNORMAL HIGH (ref <5.7)
Mean Plasma Glucose: 214 mg/dL

## 2016-12-16 MED ORDER — ROMIPLOSTIM 250 MCG ~~LOC~~ SOLR
170.0000 ug | SUBCUTANEOUS | Status: DC
  Administered 2016-12-16: 170 ug via SUBCUTANEOUS
  Filled 2016-12-16: qty 0.34

## 2016-12-16 NOTE — Patient Instructions (Signed)
Romiplostim injection What is this medicine? ROMIPLOSTIM (roe mi PLOE stim) helps your body make more platelets. This medicine is used to treat low platelets caused by chronic idiopathic thrombocytopenic purpura (ITP). This medicine may be used for other purposes; ask your health care provider or pharmacist if you have questions. COMMON BRAND NAME(S): Nplate What should I tell my health care provider before I take this medicine? They need to know if you have any of these conditions: -cancer or myelodysplastic syndrome -low blood counts, like low white cell, platelet, or red cell counts -take medicines that treat or prevent blood clots -an unusual or allergic reaction to romiplostim, mannitol, other medicines, foods, dyes, or preservatives -pregnant or trying to get pregnant -breast-feeding How should I use this medicine? This medicine is for injection under the skin. It is given by a health care professional in a hospital or clinic setting. A special MedGuide will be given to you before your injection. Read this information carefully each time. Talk to your pediatrician regarding the use of this medicine in children. Special care may be needed. Overdosage: If you think you have taken too much of this medicine contact a poison control center or emergency room at once. NOTE: This medicine is only for you. Do not share this medicine with others. What if I miss a dose? It is important not to miss your dose. Call your doctor or health care professional if you are unable to keep an appointment. What may interact with this medicine? Interactions are not expected. This list may not describe all possible interactions. Give your health care provider a list of all the medicines, herbs, non-prescription drugs, or dietary supplements you use. Also tell them if you smoke, drink alcohol, or use illegal drugs. Some items may interact with your medicine. What should I watch for while using this  medicine? Your condition will be monitored carefully while you are receiving this medicine. Visit your prescriber or health care professional for regular checks on your progress and for the needed blood tests. It is important to keep all appointments. What side effects may I notice from receiving this medicine? Side effects that you should report to your doctor or health care professional as soon as possible: -allergic reactions like skin rash, itching or hives, swelling of the face, lips, or tongue -shortness of breath, chest pain, swelling in a leg -unusual bleeding or bruising Side effects that usually do not require medical attention (report to your doctor or health care professional if they continue or are bothersome): -dizziness -headache -muscle aches -pain in arms and legs -stomach pain -trouble sleeping This list may not describe all possible side effects. Call your doctor for medical advice about side effects. You may report side effects to FDA at 1-800-FDA-1088. Where should I keep my medicine? This drug is given in a hospital or clinic and will not be stored at home. NOTE: This sheet is a summary. It may not cover all possible information. If you have questions about this medicine, talk to your doctor, pharmacist, or health care provider.  2018 Elsevier/Gold Standard (2008-02-05 15:13:04)  

## 2016-12-20 ENCOUNTER — Ambulatory Visit: Payer: Medicare Other | Admitting: Cardiology

## 2016-12-20 ENCOUNTER — Telehealth: Payer: Self-pay | Admitting: Cardiology

## 2016-12-20 NOTE — Telephone Encounter (Signed)
New Message   pt verbalized that she is calling call for rn   About test that pt needs

## 2016-12-20 NOTE — Telephone Encounter (Signed)
Spoke with Juliann Pulse, daughter Patient was called today to scheduled carotid doppler She states they were told at 12/10/16 appt by MD that carotid doppler test would be in Dec 2018 Reviewed note and confirmed w/daughter that this is correct Apologized for inconvenience and informed her that I would send message to scheduler to correct this appointment

## 2016-12-23 ENCOUNTER — Other Ambulatory Visit (HOSPITAL_BASED_OUTPATIENT_CLINIC_OR_DEPARTMENT_OTHER): Payer: Medicare Other

## 2016-12-23 ENCOUNTER — Ambulatory Visit (HOSPITAL_BASED_OUTPATIENT_CLINIC_OR_DEPARTMENT_OTHER): Payer: Medicare Other

## 2016-12-23 VITALS — BP 127/65 | HR 66 | Temp 97.4°F | Resp 20

## 2016-12-23 DIAGNOSIS — N183 Chronic kidney disease, stage 3 unspecified: Secondary | ICD-10-CM

## 2016-12-23 DIAGNOSIS — D631 Anemia in chronic kidney disease: Secondary | ICD-10-CM

## 2016-12-23 DIAGNOSIS — D539 Nutritional anemia, unspecified: Secondary | ICD-10-CM

## 2016-12-23 DIAGNOSIS — D693 Immune thrombocytopenic purpura: Secondary | ICD-10-CM | POA: Diagnosis present

## 2016-12-23 DIAGNOSIS — D46C Myelodysplastic syndrome with isolated del(5q) chromosomal abnormality: Secondary | ICD-10-CM

## 2016-12-23 DIAGNOSIS — D696 Thrombocytopenia, unspecified: Secondary | ICD-10-CM

## 2016-12-23 LAB — CBC WITH DIFFERENTIAL/PLATELET
BASO%: 0.3 % (ref 0.0–2.0)
BASOS ABS: 0 10*3/uL (ref 0.0–0.1)
EOS%: 1.6 % (ref 0.0–7.0)
Eosinophils Absolute: 0.1 10*3/uL (ref 0.0–0.5)
HCT: 33.4 % — ABNORMAL LOW (ref 38.4–49.9)
HGB: 10 g/dL — ABNORMAL LOW (ref 13.0–17.1)
LYMPH%: 61.6 % — ABNORMAL HIGH (ref 14.0–49.0)
MCH: 24.2 pg — AB (ref 27.2–33.4)
MCHC: 29.9 g/dL — AB (ref 32.0–36.0)
MCV: 80.9 fL (ref 79.3–98.0)
MONO#: 0.4 10*3/uL (ref 0.1–0.9)
MONO%: 11.7 % (ref 0.0–14.0)
NEUT#: 0.8 10*3/uL — ABNORMAL LOW (ref 1.5–6.5)
NEUT%: 24.8 % — AB (ref 39.0–75.0)
Platelets: 79 10*3/uL — ABNORMAL LOW (ref 140–400)
RBC: 4.13 10*6/uL — AB (ref 4.20–5.82)
RDW: 23.6 % — AB (ref 11.0–14.6)
WBC: 3.1 10*3/uL — ABNORMAL LOW (ref 4.0–10.3)
lymph#: 1.9 10*3/uL (ref 0.9–3.3)
nRBC: 0 % (ref 0–0)

## 2016-12-23 MED ORDER — ROMIPLOSTIM 250 MCG ~~LOC~~ SOLR
170.0000 ug | SUBCUTANEOUS | Status: DC
  Filled 2016-12-23: qty 0.34

## 2016-12-23 MED ORDER — CYANOCOBALAMIN 1000 MCG/ML IJ SOLN
1000.0000 ug | Freq: Once | INTRAMUSCULAR | Status: AC
  Administered 2016-12-23: 1000 ug via INTRAMUSCULAR

## 2016-12-23 NOTE — Patient Instructions (Signed)
Romiplostim injection What is this medicine? ROMIPLOSTIM (roe mi PLOE stim) helps your body make more platelets. This medicine is used to treat low platelets caused by chronic idiopathic thrombocytopenic purpura (ITP). This medicine may be used for other purposes; ask your health care provider or pharmacist if you have questions. COMMON BRAND NAME(S): Nplate What should I tell my health care provider before I take this medicine? They need to know if you have any of these conditions: -cancer or myelodysplastic syndrome -low blood counts, like low white cell, platelet, or red cell counts -take medicines that treat or prevent blood clots -an unusual or allergic reaction to romiplostim, mannitol, other medicines, foods, dyes, or preservatives -pregnant or trying to get pregnant -breast-feeding How should I use this medicine? This medicine is for injection under the skin. It is given by a health care professional in a hospital or clinic setting. A special MedGuide will be given to you before your injection. Read this information carefully each time. Talk to your pediatrician regarding the use of this medicine in children. Special care may be needed. Overdosage: If you think you have taken too much of this medicine contact a poison control center or emergency room at once. NOTE: This medicine is only for you. Do not share this medicine with others. What if I miss a dose? It is important not to miss your dose. Call your doctor or health care professional if you are unable to keep an appointment. What may interact with this medicine? Interactions are not expected. This list may not describe all possible interactions. Give your health care provider a list of all the medicines, herbs, non-prescription drugs, or dietary supplements you use. Also tell them if you smoke, drink alcohol, or use illegal drugs. Some items may interact with your medicine. What should I watch for while using this  medicine? Your condition will be monitored carefully while you are receiving this medicine. Visit your prescriber or health care professional for regular checks on your progress and for the needed blood tests. It is important to keep all appointments. What side effects may I notice from receiving this medicine? Side effects that you should report to your doctor or health care professional as soon as possible: -allergic reactions like skin rash, itching or hives, swelling of the face, lips, or tongue -shortness of breath, chest pain, swelling in a leg -unusual bleeding or bruising Side effects that usually do not require medical attention (report to your doctor or health care professional if they continue or are bothersome): -dizziness -headache -muscle aches -pain in arms and legs -stomach pain -trouble sleeping This list may not describe all possible side effects. Call your doctor for medical advice about side effects. You may report side effects to FDA at 1-800-FDA-1088. Where should I keep my medicine? This drug is given in a hospital or clinic and will not be stored at home. NOTE: This sheet is a summary. It may not cover all possible information. If you have questions about this medicine, talk to your doctor, pharmacist, or health care provider.  2018 Elsevier/Gold Standard (2008-02-05 15:13:04) Cyanocobalamin, Vitamin B12 injection What is this medicine? CYANOCOBALAMIN (sye an oh koe BAL a min) is a man made form of vitamin B12. Vitamin B12 is used in the growth of healthy blood cells, nerve cells, and proteins in the body. It also helps with the metabolism of fats and carbohydrates. This medicine is used to treat people who can not absorb vitamin B12. This medicine may be used   for other purposes; ask your health care provider or pharmacist if you have questions. COMMON BRAND NAME(S): B-12 Compliance Kit, B-12 Injection Kit, Cyomin, LA-12, Nutri-Twelve, Physicians EZ Use B-12,  Primabalt What should I tell my health care provider before I take this medicine? They need to know if you have any of these conditions: -kidney disease -Leber's disease -megaloblastic anemia -an unusual or allergic reaction to cyanocobalamin, cobalt, other medicines, foods, dyes, or preservatives -pregnant or trying to get pregnant -breast-feeding How should I use this medicine? This medicine is injected into a muscle or deeply under the skin. It is usually given by a health care professional in a clinic or doctor's office. However, your doctor may teach you how to inject yourself. Follow all instructions. Talk to your pediatrician regarding the use of this medicine in children. Special care may be needed. Overdosage: If you think you have taken too much of this medicine contact a poison control center or emergency room at once. NOTE: This medicine is only for you. Do not share this medicine with others. What if I miss a dose? If you are given your dose at a clinic or doctor's office, call to reschedule your appointment. If you give your own injections and you miss a dose, take it as soon as you can. If it is almost time for your next dose, take only that dose. Do not take double or extra doses. What may interact with this medicine? -colchicine -heavy alcohol intake This list may not describe all possible interactions. Give your health care provider a list of all the medicines, herbs, non-prescription drugs, or dietary supplements you use. Also tell them if you smoke, drink alcohol, or use illegal drugs. Some items may interact with your medicine. What should I watch for while using this medicine? Visit your doctor or health care professional regularly. You may need blood work done while you are taking this medicine. You may need to follow a special diet. Talk to your doctor. Limit your alcohol intake and avoid smoking to get the best benefit. What side effects may I notice from receiving  this medicine? Side effects that you should report to your doctor or health care professional as soon as possible: -allergic reactions like skin rash, itching or hives, swelling of the face, lips, or tongue -blue tint to skin -chest tightness, pain -difficulty breathing, wheezing -dizziness -red, swollen painful area on the leg Side effects that usually do not require medical attention (report to your doctor or health care professional if they continue or are bothersome): -diarrhea -headache This list may not describe all possible side effects. Call your doctor for medical advice about side effects. You may report side effects to FDA at 1-800-FDA-1088. Where should I keep my medicine? Keep out of the reach of children. Store at room temperature between 15 and 30 degrees C (59 and 85 degrees F). Protect from light. Throw away any unused medicine after the expiration date. NOTE: This sheet is a summary. It may not cover all possible information. If you have questions about this medicine, talk to your doctor, pharmacist, or health care provider.  2018 Elsevier/Gold Standard (2007-09-18 22:10:20)

## 2016-12-30 ENCOUNTER — Ambulatory Visit (HOSPITAL_BASED_OUTPATIENT_CLINIC_OR_DEPARTMENT_OTHER): Payer: Medicare Other

## 2016-12-30 ENCOUNTER — Other Ambulatory Visit (HOSPITAL_BASED_OUTPATIENT_CLINIC_OR_DEPARTMENT_OTHER): Payer: Medicare Other

## 2016-12-30 VITALS — BP 115/42 | HR 66 | Temp 97.5°F | Resp 18

## 2016-12-30 DIAGNOSIS — D539 Nutritional anemia, unspecified: Secondary | ICD-10-CM

## 2016-12-30 DIAGNOSIS — N183 Chronic kidney disease, stage 3 unspecified: Secondary | ICD-10-CM

## 2016-12-30 DIAGNOSIS — D46C Myelodysplastic syndrome with isolated del(5q) chromosomal abnormality: Secondary | ICD-10-CM

## 2016-12-30 DIAGNOSIS — D693 Immune thrombocytopenic purpura: Secondary | ICD-10-CM | POA: Diagnosis present

## 2016-12-30 DIAGNOSIS — D631 Anemia in chronic kidney disease: Secondary | ICD-10-CM

## 2016-12-30 DIAGNOSIS — D696 Thrombocytopenia, unspecified: Secondary | ICD-10-CM

## 2016-12-30 LAB — CBC WITH DIFFERENTIAL/PLATELET
BASO%: 0.2 % (ref 0.0–2.0)
Basophils Absolute: 0 10*3/uL (ref 0.0–0.1)
EOS ABS: 0.1 10*3/uL (ref 0.0–0.5)
EOS%: 1.1 % (ref 0.0–7.0)
HEMATOCRIT: 34.2 % — AB (ref 38.4–49.9)
HEMOGLOBIN: 10.3 g/dL — AB (ref 13.0–17.1)
LYMPH#: 1.6 10*3/uL (ref 0.9–3.3)
LYMPH%: 34.8 % (ref 14.0–49.0)
MCH: 24.3 pg — ABNORMAL LOW (ref 27.2–33.4)
MCHC: 30.1 g/dL — AB (ref 32.0–36.0)
MCV: 80.7 fL (ref 79.3–98.0)
MONO#: 0.6 10*3/uL (ref 0.1–0.9)
MONO%: 14 % (ref 0.0–14.0)
NEUT%: 49.9 % (ref 39.0–75.0)
NEUTROS ABS: 2.3 10*3/uL (ref 1.5–6.5)
RBC: 4.24 10*6/uL (ref 4.20–5.82)
RDW: 23.6 % — ABNORMAL HIGH (ref 11.0–14.6)
WBC: 4.6 10*3/uL (ref 4.0–10.3)
nRBC: 0 % (ref 0–0)

## 2016-12-30 LAB — TECHNOLOGIST REVIEW

## 2016-12-30 MED ORDER — ROMIPLOSTIM 250 MCG ~~LOC~~ SOLR
85.0000 ug | SUBCUTANEOUS | Status: DC
  Administered 2016-12-30: 85 ug via SUBCUTANEOUS
  Filled 2016-12-30: qty 0.17

## 2016-12-30 NOTE — Patient Instructions (Signed)
Romiplostim injection What is this medicine? ROMIPLOSTIM (roe mi PLOE stim) helps your body make more platelets. This medicine is used to treat low platelets caused by chronic idiopathic thrombocytopenic purpura (ITP). This medicine may be used for other purposes; ask your health care provider or pharmacist if you have questions. COMMON BRAND NAME(S): Nplate What should I tell my health care provider before I take this medicine? They need to know if you have any of these conditions: -cancer or myelodysplastic syndrome -low blood counts, like low white cell, platelet, or red cell counts -take medicines that treat or prevent blood clots -an unusual or allergic reaction to romiplostim, mannitol, other medicines, foods, dyes, or preservatives -pregnant or trying to get pregnant -breast-feeding How should I use this medicine? This medicine is for injection under the skin. It is given by a health care professional in a hospital or clinic setting. A special MedGuide will be given to you before your injection. Read this information carefully each time. Talk to your pediatrician regarding the use of this medicine in children. Special care may be needed. Overdosage: If you think you have taken too much of this medicine contact a poison control center or emergency room at once. NOTE: This medicine is only for you. Do not share this medicine with others. What if I miss a dose? It is important not to miss your dose. Call your doctor or health care professional if you are unable to keep an appointment. What may interact with this medicine? Interactions are not expected. This list may not describe all possible interactions. Give your health care provider a list of all the medicines, herbs, non-prescription drugs, or dietary supplements you use. Also tell them if you smoke, drink alcohol, or use illegal drugs. Some items may interact with your medicine. What should I watch for while using this  medicine? Your condition will be monitored carefully while you are receiving this medicine. Visit your prescriber or health care professional for regular checks on your progress and for the needed blood tests. It is important to keep all appointments. What side effects may I notice from receiving this medicine? Side effects that you should report to your doctor or health care professional as soon as possible: -allergic reactions like skin rash, itching or hives, swelling of the face, lips, or tongue -shortness of breath, chest pain, swelling in a leg -unusual bleeding or bruising Side effects that usually do not require medical attention (report to your doctor or health care professional if they continue or are bothersome): -dizziness -headache -muscle aches -pain in arms and legs -stomach pain -trouble sleeping This list may not describe all possible side effects. Call your doctor for medical advice about side effects. You may report side effects to FDA at 1-800-FDA-1088. Where should I keep my medicine? This drug is given in a hospital or clinic and will not be stored at home. NOTE: This sheet is a summary. It may not cover all possible information. If you have questions about this medicine, talk to your doctor, pharmacist, or health care provider.  2018 Elsevier/Gold Standard (2008-02-05 15:13:04) Cyanocobalamin, Vitamin B12 injection What is this medicine? CYANOCOBALAMIN (sye an oh koe BAL a min) is a man made form of vitamin B12. Vitamin B12 is used in the growth of healthy blood cells, nerve cells, and proteins in the body. It also helps with the metabolism of fats and carbohydrates. This medicine is used to treat people who can not absorb vitamin B12. This medicine may be used  for other purposes; ask your health care provider or pharmacist if you have questions. COMMON BRAND NAME(S): B-12 Compliance Kit, B-12 Injection Kit, Cyomin, LA-12, Nutri-Twelve, Physicians EZ Use B-12,  Primabalt What should I tell my health care provider before I take this medicine? They need to know if you have any of these conditions: -kidney disease -Leber's disease -megaloblastic anemia -an unusual or allergic reaction to cyanocobalamin, cobalt, other medicines, foods, dyes, or preservatives -pregnant or trying to get pregnant -breast-feeding How should I use this medicine? This medicine is injected into a muscle or deeply under the skin. It is usually given by a health care professional in a clinic or doctor's office. However, your doctor may teach you how to inject yourself. Follow all instructions. Talk to your pediatrician regarding the use of this medicine in children. Special care may be needed. Overdosage: If you think you have taken too much of this medicine contact a poison control center or emergency room at once. NOTE: This medicine is only for you. Do not share this medicine with others. What if I miss a dose? If you are given your dose at a clinic or doctor's office, call to reschedule your appointment. If you give your own injections and you miss a dose, take it as soon as you can. If it is almost time for your next dose, take only that dose. Do not take double or extra doses. What may interact with this medicine? -colchicine -heavy alcohol intake This list may not describe all possible interactions. Give your health care provider a list of all the medicines, herbs, non-prescription drugs, or dietary supplements you use. Also tell them if you smoke, drink alcohol, or use illegal drugs. Some items may interact with your medicine. What should I watch for while using this medicine? Visit your doctor or health care professional regularly. You may need blood work done while you are taking this medicine. You may need to follow a special diet. Talk to your doctor. Limit your alcohol intake and avoid smoking to get the best benefit. What side effects may I notice from receiving  this medicine? Side effects that you should report to your doctor or health care professional as soon as possible: -allergic reactions like skin rash, itching or hives, swelling of the face, lips, or tongue -blue tint to skin -chest tightness, pain -difficulty breathing, wheezing -dizziness -red, swollen painful area on the leg Side effects that usually do not require medical attention (report to your doctor or health care professional if they continue or are bothersome): -diarrhea -headache This list may not describe all possible side effects. Call your doctor for medical advice about side effects. You may report side effects to FDA at 1-800-FDA-1088. Where should I keep my medicine? Keep out of the reach of children. Store at room temperature between 15 and 30 degrees C (59 and 85 degrees F). Protect from light. Throw away any unused medicine after the expiration date. NOTE: This sheet is a summary. It may not cover all possible information. If you have questions about this medicine, talk to your doctor, pharmacist, or health care provider.  2018 Elsevier/Gold Standard (2007-09-18 22:10:20)

## 2017-01-06 ENCOUNTER — Other Ambulatory Visit (HOSPITAL_BASED_OUTPATIENT_CLINIC_OR_DEPARTMENT_OTHER): Payer: Medicare Other

## 2017-01-06 ENCOUNTER — Other Ambulatory Visit: Payer: Self-pay | Admitting: Hematology and Oncology

## 2017-01-06 ENCOUNTER — Ambulatory Visit (HOSPITAL_BASED_OUTPATIENT_CLINIC_OR_DEPARTMENT_OTHER): Payer: Medicare Other

## 2017-01-06 VITALS — BP 148/62 | HR 61 | Temp 97.8°F | Resp 18

## 2017-01-06 DIAGNOSIS — N183 Chronic kidney disease, stage 3 unspecified: Secondary | ICD-10-CM

## 2017-01-06 DIAGNOSIS — D46C Myelodysplastic syndrome with isolated del(5q) chromosomal abnormality: Secondary | ICD-10-CM

## 2017-01-06 DIAGNOSIS — D539 Nutritional anemia, unspecified: Secondary | ICD-10-CM

## 2017-01-06 DIAGNOSIS — D693 Immune thrombocytopenic purpura: Secondary | ICD-10-CM | POA: Diagnosis present

## 2017-01-06 DIAGNOSIS — D631 Anemia in chronic kidney disease: Secondary | ICD-10-CM

## 2017-01-06 DIAGNOSIS — D696 Thrombocytopenia, unspecified: Secondary | ICD-10-CM

## 2017-01-06 LAB — CBC WITH DIFFERENTIAL/PLATELET
BASO%: 3 % — ABNORMAL HIGH (ref 0.0–2.0)
BASOS ABS: 0.1 10*3/uL (ref 0.0–0.1)
EOS ABS: 0.1 10*3/uL (ref 0.0–0.5)
EOS%: 2 % (ref 0.0–7.0)
HEMATOCRIT: 33.3 % — AB (ref 38.4–49.9)
HEMOGLOBIN: 10.4 g/dL — AB (ref 13.0–17.1)
LYMPH#: 1.9 10*3/uL (ref 0.9–3.3)
LYMPH%: 43 % (ref 14.0–49.0)
MCH: 24.2 pg — AB (ref 27.2–33.4)
MCHC: 31.2 g/dL — ABNORMAL LOW (ref 32.0–36.0)
MCV: 77.6 fL — AB (ref 79.3–98.0)
MONO#: 0.3 10*3/uL (ref 0.1–0.9)
MONO%: 7.5 % (ref 0.0–14.0)
NEUT#: 2 10*3/uL (ref 1.5–6.5)
NEUT%: 44.5 % (ref 39.0–75.0)
NRBC: 0 % (ref 0–0)
PLATELETS: 52 10*3/uL — AB (ref 140–400)
RBC: 4.28 10*6/uL (ref 4.20–5.82)
RDW: 25.1 % — AB (ref 11.0–14.6)
WBC: 4.5 10*3/uL (ref 4.0–10.3)

## 2017-01-06 LAB — TECHNOLOGIST REVIEW

## 2017-01-06 MED ORDER — ROMIPLOSTIM 250 MCG ~~LOC~~ SOLR
170.0000 ug | SUBCUTANEOUS | Status: DC
  Administered 2017-01-06: 170 ug via SUBCUTANEOUS
  Filled 2017-01-06: qty 0.34

## 2017-01-06 NOTE — Patient Instructions (Signed)
Romiplostim injection What is this medicine? ROMIPLOSTIM (roe mi PLOE stim) helps your body make more platelets. This medicine is used to treat low platelets caused by chronic idiopathic thrombocytopenic purpura (ITP). This medicine may be used for other purposes; ask your health care provider or pharmacist if you have questions. COMMON BRAND NAME(S): Nplate What should I tell my health care provider before I take this medicine? They need to know if you have any of these conditions: -cancer or myelodysplastic syndrome -low blood counts, like low white cell, platelet, or red cell counts -take medicines that treat or prevent blood clots -an unusual or allergic reaction to romiplostim, mannitol, other medicines, foods, dyes, or preservatives -pregnant or trying to get pregnant -breast-feeding How should I use this medicine? This medicine is for injection under the skin. It is given by a health care professional in a hospital or clinic setting. A special MedGuide will be given to you before your injection. Read this information carefully each time. Talk to your pediatrician regarding the use of this medicine in children. Special care may be needed. Overdosage: If you think you have taken too much of this medicine contact a poison control center or emergency room at once. NOTE: This medicine is only for you. Do not share this medicine with others. What if I miss a dose? It is important not to miss your dose. Call your doctor or health care professional if you are unable to keep an appointment. What may interact with this medicine? Interactions are not expected. This list may not describe all possible interactions. Give your health care provider a list of all the medicines, herbs, non-prescription drugs, or dietary supplements you use. Also tell them if you smoke, drink alcohol, or use illegal drugs. Some items may interact with your medicine. What should I watch for while using this  medicine? Your condition will be monitored carefully while you are receiving this medicine. Visit your prescriber or health care professional for regular checks on your progress and for the needed blood tests. It is important to keep all appointments. What side effects may I notice from receiving this medicine? Side effects that you should report to your doctor or health care professional as soon as possible: -allergic reactions like skin rash, itching or hives, swelling of the face, lips, or tongue -shortness of breath, chest pain, swelling in a leg -unusual bleeding or bruising Side effects that usually do not require medical attention (report to your doctor or health care professional if they continue or are bothersome): -dizziness -headache -muscle aches -pain in arms and legs -stomach pain -trouble sleeping This list may not describe all possible side effects. Call your doctor for medical advice about side effects. You may report side effects to FDA at 1-800-FDA-1088. Where should I keep my medicine? This drug is given in a hospital or clinic and will not be stored at home. NOTE: This sheet is a summary. It may not cover all possible information. If you have questions about this medicine, talk to your doctor, pharmacist, or health care provider.  2018 Elsevier/Gold Standard (2008-02-05 15:13:04)  

## 2017-01-10 ENCOUNTER — Other Ambulatory Visit: Payer: Self-pay | Admitting: Family Medicine

## 2017-01-11 ENCOUNTER — Encounter (HOSPITAL_COMMUNITY): Payer: Medicare Other

## 2017-01-12 ENCOUNTER — Encounter: Payer: Self-pay | Admitting: Podiatry

## 2017-01-12 ENCOUNTER — Ambulatory Visit (INDEPENDENT_AMBULATORY_CARE_PROVIDER_SITE_OTHER): Payer: Medicare Other | Admitting: Podiatry

## 2017-01-12 DIAGNOSIS — M79676 Pain in unspecified toe(s): Secondary | ICD-10-CM

## 2017-01-12 DIAGNOSIS — B351 Tinea unguium: Secondary | ICD-10-CM

## 2017-01-12 DIAGNOSIS — I739 Peripheral vascular disease, unspecified: Secondary | ICD-10-CM

## 2017-01-12 NOTE — Patient Instructions (Signed)

## 2017-01-13 ENCOUNTER — Other Ambulatory Visit (HOSPITAL_BASED_OUTPATIENT_CLINIC_OR_DEPARTMENT_OTHER): Payer: Medicare Other

## 2017-01-13 ENCOUNTER — Ambulatory Visit (HOSPITAL_BASED_OUTPATIENT_CLINIC_OR_DEPARTMENT_OTHER): Payer: Medicare Other

## 2017-01-13 VITALS — BP 121/59 | HR 67 | Temp 97.0°F | Resp 20

## 2017-01-13 DIAGNOSIS — D46C Myelodysplastic syndrome with isolated del(5q) chromosomal abnormality: Secondary | ICD-10-CM

## 2017-01-13 DIAGNOSIS — D693 Immune thrombocytopenic purpura: Secondary | ICD-10-CM

## 2017-01-13 DIAGNOSIS — N183 Chronic kidney disease, stage 3 unspecified: Secondary | ICD-10-CM

## 2017-01-13 DIAGNOSIS — D631 Anemia in chronic kidney disease: Secondary | ICD-10-CM | POA: Diagnosis not present

## 2017-01-13 DIAGNOSIS — D696 Thrombocytopenia, unspecified: Secondary | ICD-10-CM

## 2017-01-13 DIAGNOSIS — D539 Nutritional anemia, unspecified: Secondary | ICD-10-CM

## 2017-01-13 LAB — CBC WITH DIFFERENTIAL/PLATELET
BASO%: 0.2 % (ref 0.0–2.0)
Basophils Absolute: 0 10*3/uL (ref 0.0–0.1)
EOS ABS: 0.1 10*3/uL (ref 0.0–0.5)
EOS%: 1.4 % (ref 0.0–7.0)
HEMATOCRIT: 37.4 % — AB (ref 38.4–49.9)
HGB: 11.1 g/dL — ABNORMAL LOW (ref 13.0–17.1)
LYMPH%: 34.1 % (ref 14.0–49.0)
MCH: 24.2 pg — ABNORMAL LOW (ref 27.2–33.4)
MCHC: 29.7 g/dL — AB (ref 32.0–36.0)
MCV: 81.7 fL (ref 79.3–98.0)
MONO#: 0.6 10*3/uL (ref 0.1–0.9)
MONO%: 11.4 % (ref 0.0–14.0)
NEUT%: 52.9 % (ref 39.0–75.0)
NEUTROS ABS: 2.9 10*3/uL (ref 1.5–6.5)
RBC: 4.58 10*6/uL (ref 4.20–5.82)
RDW: 23.9 % — ABNORMAL HIGH (ref 11.0–14.6)
WBC: 5.5 10*3/uL (ref 4.0–10.3)
lymph#: 1.9 10*3/uL (ref 0.9–3.3)
nRBC: 0 % (ref 0–0)

## 2017-01-13 LAB — TECHNOLOGIST REVIEW

## 2017-01-13 MED ORDER — ROMIPLOSTIM 250 MCG ~~LOC~~ SOLR
170.0000 ug | SUBCUTANEOUS | Status: DC
  Administered 2017-01-13: 170 ug via SUBCUTANEOUS
  Filled 2017-01-13: qty 0.34

## 2017-01-13 NOTE — Patient Instructions (Signed)
Romiplostim injection What is this medicine? ROMIPLOSTIM (roe mi PLOE stim) helps your body make more platelets. This medicine is used to treat low platelets caused by chronic idiopathic thrombocytopenic purpura (ITP). This medicine may be used for other purposes; ask your health care provider or pharmacist if you have questions. COMMON BRAND NAME(S): Nplate What should I tell my health care provider before I take this medicine? They need to know if you have any of these conditions: -cancer or myelodysplastic syndrome -low blood counts, like low white cell, platelet, or red cell counts -take medicines that treat or prevent blood clots -an unusual or allergic reaction to romiplostim, mannitol, other medicines, foods, dyes, or preservatives -pregnant or trying to get pregnant -breast-feeding How should I use this medicine? This medicine is for injection under the skin. It is given by a health care professional in a hospital or clinic setting. A special MedGuide will be given to you before your injection. Read this information carefully each time. Talk to your pediatrician regarding the use of this medicine in children. Special care may be needed. Overdosage: If you think you have taken too much of this medicine contact a poison control center or emergency room at once. NOTE: This medicine is only for you. Do not share this medicine with others. What if I miss a dose? It is important not to miss your dose. Call your doctor or health care professional if you are unable to keep an appointment. What may interact with this medicine? Interactions are not expected. This list may not describe all possible interactions. Give your health care provider a list of all the medicines, herbs, non-prescription drugs, or dietary supplements you use. Also tell them if you smoke, drink alcohol, or use illegal drugs. Some items may interact with your medicine. What should I watch for while using this  medicine? Your condition will be monitored carefully while you are receiving this medicine. Visit your prescriber or health care professional for regular checks on your progress and for the needed blood tests. It is important to keep all appointments. What side effects may I notice from receiving this medicine? Side effects that you should report to your doctor or health care professional as soon as possible: -allergic reactions like skin rash, itching or hives, swelling of the face, lips, or tongue -shortness of breath, chest pain, swelling in a leg -unusual bleeding or bruising Side effects that usually do not require medical attention (report to your doctor or health care professional if they continue or are bothersome): -dizziness -headache -muscle aches -pain in arms and legs -stomach pain -trouble sleeping This list may not describe all possible side effects. Call your doctor for medical advice about side effects. You may report side effects to FDA at 1-800-FDA-1088. Where should I keep my medicine? This drug is given in a hospital or clinic and will not be stored at home. NOTE: This sheet is a summary. It may not cover all possible information. If you have questions about this medicine, talk to your doctor, pharmacist, or health care provider.  2018 Elsevier/Gold Standard (2008-02-05 15:13:04)  

## 2017-01-13 NOTE — Progress Notes (Signed)
Patient ID: Ricardo Watkins, male   DOB: [birth year], 83 y.o.   MRN: [id number]    Subjective: This patient presents for scheduled visit and request debridement of painful toenails which are uncomfortable when walking wearing shoes.The patient's wife is present in the treatment room today  Objective: Orientated 3 Peripheral pitting edema bilaterally DP pulses 0/4 bilaterally PT pulses 1/4 bilaterally Sensation to 10 g monofilament wire intact 5/5 bilaterally Vibratory sensation reactive bilaterally Ankle reflex equal and reactive bilaterally No deformities bilaterally Manual motor testing dorsi flexion, plantar flexion 5/5 bilaterally No open skin lesions bilaterally Atrophic skin with absent hair growth bilaterally The toenails are hypertrophic, elongated, incurvated, discolored and tender direct palpation 6-10 Patient walks slowly with assistance of cane  Assessment: Diabeticwith peripheral arterial disease Symptomatic onychomycoses 6-10  Plan: Debridement toenails 10 and mechanically and electrically without any bleeding   Reappoint 3 months

## 2017-01-14 LAB — IRON AND TIBC
%SAT: 23 % (ref 20–55)
Iron: 55 ug/dL (ref 42–163)
TIBC: 234 ug/dL (ref 202–409)
UIBC: 179 ug/dL (ref 117–376)

## 2017-01-14 LAB — FERRITIN: FERRITIN: 194 ng/mL (ref 22–316)

## 2017-01-20 ENCOUNTER — Other Ambulatory Visit (HOSPITAL_BASED_OUTPATIENT_CLINIC_OR_DEPARTMENT_OTHER): Payer: Medicare Other

## 2017-01-20 ENCOUNTER — Ambulatory Visit (HOSPITAL_BASED_OUTPATIENT_CLINIC_OR_DEPARTMENT_OTHER): Payer: Medicare Other

## 2017-01-20 VITALS — BP 146/58 | HR 63 | Temp 97.8°F | Resp 18

## 2017-01-20 DIAGNOSIS — N183 Chronic kidney disease, stage 3 unspecified: Secondary | ICD-10-CM

## 2017-01-20 DIAGNOSIS — D693 Immune thrombocytopenic purpura: Secondary | ICD-10-CM | POA: Diagnosis present

## 2017-01-20 DIAGNOSIS — D631 Anemia in chronic kidney disease: Secondary | ICD-10-CM | POA: Diagnosis not present

## 2017-01-20 DIAGNOSIS — D46C Myelodysplastic syndrome with isolated del(5q) chromosomal abnormality: Secondary | ICD-10-CM

## 2017-01-20 DIAGNOSIS — D696 Thrombocytopenia, unspecified: Secondary | ICD-10-CM

## 2017-01-20 DIAGNOSIS — D539 Nutritional anemia, unspecified: Secondary | ICD-10-CM

## 2017-01-20 LAB — CBC WITH DIFFERENTIAL/PLATELET
BASO%: 0.3 % (ref 0.0–2.0)
BASOS ABS: 0 10*3/uL (ref 0.0–0.1)
EOS%: 1 % (ref 0.0–7.0)
Eosinophils Absolute: 0 10*3/uL (ref 0.0–0.5)
HEMATOCRIT: 36.7 % — AB (ref 38.4–49.9)
HEMOGLOBIN: 10.9 g/dL — AB (ref 13.0–17.1)
LYMPH#: 1.8 10*3/uL (ref 0.9–3.3)
LYMPH%: 58.9 % — AB (ref 14.0–49.0)
MCH: 24.2 pg — AB (ref 27.2–33.4)
MCHC: 29.7 g/dL — AB (ref 32.0–36.0)
MCV: 81.6 fL (ref 79.3–98.0)
MONO#: 0.3 10*3/uL (ref 0.1–0.9)
MONO%: 11 % (ref 0.0–14.0)
NEUT#: 0.9 10*3/uL — ABNORMAL LOW (ref 1.5–6.5)
NEUT%: 28.8 % — AB (ref 39.0–75.0)
Platelets: 97 10*3/uL — ABNORMAL LOW (ref 140–400)
RBC: 4.5 10*6/uL (ref 4.20–5.82)
RDW: 23.9 % — ABNORMAL HIGH (ref 11.0–14.6)
WBC: 3.1 10*3/uL — ABNORMAL LOW (ref 4.0–10.3)
nRBC: 0 % (ref 0–0)

## 2017-01-20 LAB — TECHNOLOGIST REVIEW

## 2017-01-20 MED ORDER — CYANOCOBALAMIN 1000 MCG/ML IJ SOLN
1000.0000 ug | Freq: Once | INTRAMUSCULAR | Status: AC
  Administered 2017-01-20: 1000 ug via INTRAMUSCULAR

## 2017-01-20 MED ORDER — ROMIPLOSTIM 250 MCG ~~LOC~~ SOLR
170.0000 ug | SUBCUTANEOUS | Status: DC
  Administered 2017-01-20: 170 ug via SUBCUTANEOUS
  Filled 2017-01-20: qty 0.34

## 2017-01-20 NOTE — Patient Instructions (Signed)
Romiplostim injection What is this medicine? ROMIPLOSTIM (roe mi PLOE stim) helps your body make more platelets. This medicine is used to treat low platelets caused by chronic idiopathic thrombocytopenic purpura (ITP). This medicine may be used for other purposes; ask your health care provider or pharmacist if you have questions. COMMON BRAND NAME(S): Nplate What should I tell my health care provider before I take this medicine? They need to know if you have any of these conditions: -cancer or myelodysplastic syndrome -low blood counts, like low white cell, platelet, or red cell counts -take medicines that treat or prevent blood clots -an unusual or allergic reaction to romiplostim, mannitol, other medicines, foods, dyes, or preservatives -pregnant or trying to get pregnant -breast-feeding How should I use this medicine? This medicine is for injection under the skin. It is given by a health care professional in a hospital or clinic setting. A special MedGuide will be given to you before your injection. Read this information carefully each time. Talk to your pediatrician regarding the use of this medicine in children. Special care may be needed. Overdosage: If you think you have taken too much of this medicine contact a poison control center or emergency room at once. NOTE: This medicine is only for you. Do not share this medicine with others. What if I miss a dose? It is important not to miss your dose. Call your doctor or health care professional if you are unable to keep an appointment. What may interact with this medicine? Interactions are not expected. This list may not describe all possible interactions. Give your health care provider a list of all the medicines, herbs, non-prescription drugs, or dietary supplements you use. Also tell them if you smoke, drink alcohol, or use illegal drugs. Some items may interact with your medicine. What should I watch for while using this  medicine? Your condition will be monitored carefully while you are receiving this medicine. Visit your prescriber or health care professional for regular checks on your progress and for the needed blood tests. It is important to keep all appointments. What side effects may I notice from receiving this medicine? Side effects that you should report to your doctor or health care professional as soon as possible: -allergic reactions like skin rash, itching or hives, swelling of the face, lips, or tongue -shortness of breath, chest pain, swelling in a leg -unusual bleeding or bruising Side effects that usually do not require medical attention (report to your doctor or health care professional if they continue or are bothersome): -dizziness -headache -muscle aches -pain in arms and legs -stomach pain -trouble sleeping This list may not describe all possible side effects. Call your doctor for medical advice about side effects. You may report side effects to FDA at 1-800-FDA-1088. Where should I keep my medicine? This drug is given in a hospital or clinic and will not be stored at home. NOTE: This sheet is a summary. It may not cover all possible information. If you have questions about this medicine, talk to your doctor, pharmacist, or health care provider.  2018 Elsevier/Gold Standard (2008-02-05 15:13:04) Cyanocobalamin, Vitamin B12 injection What is this medicine? CYANOCOBALAMIN (sye an oh koe BAL a min) is a man made form of vitamin B12. Vitamin B12 is used in the growth of healthy blood cells, nerve cells, and proteins in the body. It also helps with the metabolism of fats and carbohydrates. This medicine is used to treat people who can not absorb vitamin B12. This medicine may be used   for other purposes; ask your health care provider or pharmacist if you have questions. COMMON BRAND NAME(S): B-12 Compliance Kit, B-12 Injection Kit, Cyomin, LA-12, Nutri-Twelve, Physicians EZ Use B-12,  Primabalt What should I tell my health care provider before I take this medicine? They need to know if you have any of these conditions: -kidney disease -Leber's disease -megaloblastic anemia -an unusual or allergic reaction to cyanocobalamin, cobalt, other medicines, foods, dyes, or preservatives -pregnant or trying to get pregnant -breast-feeding How should I use this medicine? This medicine is injected into a muscle or deeply under the skin. It is usually given by a health care professional in a clinic or doctor's office. However, your doctor may teach you how to inject yourself. Follow all instructions. Talk to your pediatrician regarding the use of this medicine in children. Special care may be needed. Overdosage: If you think you have taken too much of this medicine contact a poison control center or emergency room at once. NOTE: This medicine is only for you. Do not share this medicine with others. What if I miss a dose? If you are given your dose at a clinic or doctor's office, call to reschedule your appointment. If you give your own injections and you miss a dose, take it as soon as you can. If it is almost time for your next dose, take only that dose. Do not take double or extra doses. What may interact with this medicine? -colchicine -heavy alcohol intake This list may not describe all possible interactions. Give your health care provider a list of all the medicines, herbs, non-prescription drugs, or dietary supplements you use. Also tell them if you smoke, drink alcohol, or use illegal drugs. Some items may interact with your medicine. What should I watch for while using this medicine? Visit your doctor or health care professional regularly. You may need blood work done while you are taking this medicine. You may need to follow a special diet. Talk to your doctor. Limit your alcohol intake and avoid smoking to get the best benefit. What side effects may I notice from receiving  this medicine? Side effects that you should report to your doctor or health care professional as soon as possible: -allergic reactions like skin rash, itching or hives, swelling of the face, lips, or tongue -blue tint to skin -chest tightness, pain -difficulty breathing, wheezing -dizziness -red, swollen painful area on the leg Side effects that usually do not require medical attention (report to your doctor or health care professional if they continue or are bothersome): -diarrhea -headache This list may not describe all possible side effects. Call your doctor for medical advice about side effects. You may report side effects to FDA at 1-800-FDA-1088. Where should I keep my medicine? Keep out of the reach of children. Store at room temperature between 15 and 30 degrees C (59 and 85 degrees F). Protect from light. Throw away any unused medicine after the expiration date. NOTE: This sheet is a summary. It may not cover all possible information. If you have questions about this medicine, talk to your doctor, pharmacist, or health care provider.  2018 Elsevier/Gold Standard (2007-09-18 22:10:20)

## 2017-01-27 ENCOUNTER — Other Ambulatory Visit (HOSPITAL_BASED_OUTPATIENT_CLINIC_OR_DEPARTMENT_OTHER): Payer: Medicare Other

## 2017-01-27 ENCOUNTER — Ambulatory Visit (HOSPITAL_BASED_OUTPATIENT_CLINIC_OR_DEPARTMENT_OTHER): Payer: Medicare Other

## 2017-01-27 VITALS — BP 152/60 | HR 53 | Temp 97.5°F | Resp 20

## 2017-01-27 DIAGNOSIS — N183 Chronic kidney disease, stage 3 unspecified: Secondary | ICD-10-CM

## 2017-01-27 DIAGNOSIS — D693 Immune thrombocytopenic purpura: Secondary | ICD-10-CM

## 2017-01-27 DIAGNOSIS — D696 Thrombocytopenia, unspecified: Secondary | ICD-10-CM

## 2017-01-27 DIAGNOSIS — D539 Nutritional anemia, unspecified: Secondary | ICD-10-CM

## 2017-01-27 DIAGNOSIS — D46C Myelodysplastic syndrome with isolated del(5q) chromosomal abnormality: Secondary | ICD-10-CM

## 2017-01-27 DIAGNOSIS — D631 Anemia in chronic kidney disease: Secondary | ICD-10-CM

## 2017-01-27 LAB — CBC WITH DIFFERENTIAL/PLATELET
BASO%: 0 % (ref 0.0–2.0)
Basophils Absolute: 0 10*3/uL (ref 0.0–0.1)
EOS ABS: 0 10*3/uL (ref 0.0–0.5)
EOS%: 0.3 % (ref 0.0–7.0)
HEMATOCRIT: 34.8 % — AB (ref 38.4–49.9)
HGB: 10.3 g/dL — ABNORMAL LOW (ref 13.0–17.1)
LYMPH%: 55.9 % — AB (ref 14.0–49.0)
MCH: 24.1 pg — AB (ref 27.2–33.4)
MCHC: 29.6 g/dL — AB (ref 32.0–36.0)
MCV: 81.5 fL (ref 79.3–98.0)
MONO#: 0.4 10*3/uL (ref 0.1–0.9)
MONO%: 10.3 % (ref 0.0–14.0)
NEUT%: 33.5 % — AB (ref 39.0–75.0)
NEUTROS ABS: 1.3 10*3/uL — AB (ref 1.5–6.5)
PLATELETS: 85 10*3/uL — AB (ref 140–400)
RBC: 4.27 10*6/uL (ref 4.20–5.82)
RDW: 23.8 % — ABNORMAL HIGH (ref 11.0–14.6)
WBC: 3.8 10*3/uL — AB (ref 4.0–10.3)
lymph#: 2.1 10*3/uL (ref 0.9–3.3)
nRBC: 0 % (ref 0–0)

## 2017-01-27 LAB — TECHNOLOGIST REVIEW

## 2017-01-27 MED ORDER — ROMIPLOSTIM 250 MCG ~~LOC~~ SOLR
170.0000 ug | SUBCUTANEOUS | Status: DC
  Administered 2017-01-27: 170 ug via SUBCUTANEOUS
  Filled 2017-01-27: qty 0.34

## 2017-01-27 NOTE — Patient Instructions (Signed)
Romiplostim injection What is this medicine? ROMIPLOSTIM (roe mi PLOE stim) helps your body make more platelets. This medicine is used to treat low platelets caused by chronic idiopathic thrombocytopenic purpura (ITP). This medicine may be used for other purposes; ask your health care provider or pharmacist if you have questions. COMMON BRAND NAME(S): Nplate What should I tell my health care provider before I take this medicine? They need to know if you have any of these conditions: -cancer or myelodysplastic syndrome -low blood counts, like low white cell, platelet, or red cell counts -take medicines that treat or prevent blood clots -an unusual or allergic reaction to romiplostim, mannitol, other medicines, foods, dyes, or preservatives -pregnant or trying to get pregnant -breast-feeding How should I use this medicine? This medicine is for injection under the skin. It is given by a health care professional in a hospital or clinic setting. A special MedGuide will be given to you before your injection. Read this information carefully each time. Talk to your pediatrician regarding the use of this medicine in children. Special care may be needed. Overdosage: If you think you have taken too much of this medicine contact a poison control center or emergency room at once. NOTE: This medicine is only for you. Do not share this medicine with others. What if I miss a dose? It is important not to miss your dose. Call your doctor or health care professional if you are unable to keep an appointment. What may interact with this medicine? Interactions are not expected. This list may not describe all possible interactions. Give your health care provider a list of all the medicines, herbs, non-prescription drugs, or dietary supplements you use. Also tell them if you smoke, drink alcohol, or use illegal drugs. Some items may interact with your medicine. What should I watch for while using this  medicine? Your condition will be monitored carefully while you are receiving this medicine. Visit your prescriber or health care professional for regular checks on your progress and for the needed blood tests. It is important to keep all appointments. What side effects may I notice from receiving this medicine? Side effects that you should report to your doctor or health care professional as soon as possible: -allergic reactions like skin rash, itching or hives, swelling of the face, lips, or tongue -shortness of breath, chest pain, swelling in a leg -unusual bleeding or bruising Side effects that usually do not require medical attention (report to your doctor or health care professional if they continue or are bothersome): -dizziness -headache -muscle aches -pain in arms and legs -stomach pain -trouble sleeping This list may not describe all possible side effects. Call your doctor for medical advice about side effects. You may report side effects to FDA at 1-800-FDA-1088. Where should I keep my medicine? This drug is given in a hospital or clinic and will not be stored at home. NOTE: This sheet is a summary. It may not cover all possible information. If you have questions about this medicine, talk to your doctor, pharmacist, or health care provider.  2018 Elsevier/Gold Standard (2008-02-05 15:13:04)  

## 2017-02-03 ENCOUNTER — Ambulatory Visit (HOSPITAL_BASED_OUTPATIENT_CLINIC_OR_DEPARTMENT_OTHER): Payer: Medicare Other

## 2017-02-03 ENCOUNTER — Other Ambulatory Visit (HOSPITAL_BASED_OUTPATIENT_CLINIC_OR_DEPARTMENT_OTHER): Payer: Medicare Other

## 2017-02-03 VITALS — BP 139/61 | HR 66 | Temp 97.5°F | Resp 18

## 2017-02-03 DIAGNOSIS — D46C Myelodysplastic syndrome with isolated del(5q) chromosomal abnormality: Secondary | ICD-10-CM

## 2017-02-03 DIAGNOSIS — D696 Thrombocytopenia, unspecified: Secondary | ICD-10-CM

## 2017-02-03 DIAGNOSIS — D693 Immune thrombocytopenic purpura: Secondary | ICD-10-CM | POA: Diagnosis present

## 2017-02-03 DIAGNOSIS — D631 Anemia in chronic kidney disease: Secondary | ICD-10-CM

## 2017-02-03 DIAGNOSIS — D539 Nutritional anemia, unspecified: Secondary | ICD-10-CM

## 2017-02-03 DIAGNOSIS — N183 Chronic kidney disease, stage 3 unspecified: Secondary | ICD-10-CM

## 2017-02-03 LAB — CBC WITH DIFFERENTIAL/PLATELET
BASO%: 0.3 % (ref 0.0–2.0)
Basophils Absolute: 0 10*3/uL (ref 0.0–0.1)
EOS%: 0.3 % (ref 0.0–7.0)
Eosinophils Absolute: 0 10*3/uL (ref 0.0–0.5)
HCT: 36.6 % — ABNORMAL LOW (ref 38.4–49.9)
HGB: 10.8 g/dL — ABNORMAL LOW (ref 13.0–17.1)
LYMPH#: 1.8 10*3/uL (ref 0.9–3.3)
LYMPH%: 54.1 % — AB (ref 14.0–49.0)
MCH: 24.2 pg — AB (ref 27.2–33.4)
MCHC: 29.5 g/dL — AB (ref 32.0–36.0)
MCV: 81.9 fL (ref 79.3–98.0)
MONO#: 0.3 10*3/uL (ref 0.1–0.9)
MONO%: 10.4 % (ref 0.0–14.0)
NEUT%: 34.9 % — AB (ref 39.0–75.0)
NEUTROS ABS: 1.1 10*3/uL — AB (ref 1.5–6.5)
Platelets: 87 10*3/uL — ABNORMAL LOW (ref 140–400)
RBC: 4.47 10*6/uL (ref 4.20–5.82)
RDW: 23.8 % — ABNORMAL HIGH (ref 11.0–14.6)
WBC: 3.3 10*3/uL — AB (ref 4.0–10.3)
nRBC: 1 % — ABNORMAL HIGH (ref 0–0)

## 2017-02-03 LAB — TECHNOLOGIST REVIEW

## 2017-02-03 MED ORDER — ROMIPLOSTIM 250 MCG ~~LOC~~ SOLR
170.0000 ug | SUBCUTANEOUS | Status: DC
  Administered 2017-02-03: 170 ug via SUBCUTANEOUS
  Filled 2017-02-03: qty 0.34

## 2017-02-03 NOTE — Patient Instructions (Signed)
Romiplostim injection What is this medicine? ROMIPLOSTIM (roe mi PLOE stim) helps your body make more platelets. This medicine is used to treat low platelets caused by chronic idiopathic thrombocytopenic purpura (ITP). This medicine may be used for other purposes; ask your health care provider or pharmacist if you have questions. COMMON BRAND NAME(S): Nplate What should I tell my health care provider before I take this medicine? They need to know if you have any of these conditions: -cancer or myelodysplastic syndrome -low blood counts, like low white cell, platelet, or red cell counts -take medicines that treat or prevent blood clots -an unusual or allergic reaction to romiplostim, mannitol, other medicines, foods, dyes, or preservatives -pregnant or trying to get pregnant -breast-feeding How should I use this medicine? This medicine is for injection under the skin. It is given by a health care professional in a hospital or clinic setting. A special MedGuide will be given to you before your injection. Read this information carefully each time. Talk to your pediatrician regarding the use of this medicine in children. Special care may be needed. Overdosage: If you think you have taken too much of this medicine contact a poison control center or emergency room at once. NOTE: This medicine is only for you. Do not share this medicine with others. What if I miss a dose? It is important not to miss your dose. Call your doctor or health care professional if you are unable to keep an appointment. What may interact with this medicine? Interactions are not expected. This list may not describe all possible interactions. Give your health care provider a list of all the medicines, herbs, non-prescription drugs, or dietary supplements you use. Also tell them if you smoke, drink alcohol, or use illegal drugs. Some items may interact with your medicine. What should I watch for while using this  medicine? Your condition will be monitored carefully while you are receiving this medicine. Visit your prescriber or health care professional for regular checks on your progress and for the needed blood tests. It is important to keep all appointments. What side effects may I notice from receiving this medicine? Side effects that you should report to your doctor or health care professional as soon as possible: -allergic reactions like skin rash, itching or hives, swelling of the face, lips, or tongue -shortness of breath, chest pain, swelling in a leg -unusual bleeding or bruising Side effects that usually do not require medical attention (report to your doctor or health care professional if they continue or are bothersome): -dizziness -headache -muscle aches -pain in arms and legs -stomach pain -trouble sleeping This list may not describe all possible side effects. Call your doctor for medical advice about side effects. You may report side effects to FDA at 1-800-FDA-1088. Where should I keep my medicine? This drug is given in a hospital or clinic and will not be stored at home. NOTE: This sheet is a summary. It may not cover all possible information. If you have questions about this medicine, talk to your doctor, pharmacist, or health care provider.  2018 Elsevier/Gold Standard (2008-02-05 15:13:04)  

## 2017-02-10 ENCOUNTER — Other Ambulatory Visit (HOSPITAL_BASED_OUTPATIENT_CLINIC_OR_DEPARTMENT_OTHER): Payer: Medicare Other

## 2017-02-10 ENCOUNTER — Ambulatory Visit (HOSPITAL_BASED_OUTPATIENT_CLINIC_OR_DEPARTMENT_OTHER): Payer: Medicare Other

## 2017-02-10 VITALS — BP 149/60 | HR 64 | Temp 97.9°F | Resp 20

## 2017-02-10 DIAGNOSIS — D46C Myelodysplastic syndrome with isolated del(5q) chromosomal abnormality: Secondary | ICD-10-CM

## 2017-02-10 DIAGNOSIS — D693 Immune thrombocytopenic purpura: Secondary | ICD-10-CM

## 2017-02-10 DIAGNOSIS — D631 Anemia in chronic kidney disease: Secondary | ICD-10-CM

## 2017-02-10 DIAGNOSIS — D696 Thrombocytopenia, unspecified: Secondary | ICD-10-CM

## 2017-02-10 DIAGNOSIS — N183 Chronic kidney disease, stage 3 unspecified: Secondary | ICD-10-CM

## 2017-02-10 DIAGNOSIS — D539 Nutritional anemia, unspecified: Secondary | ICD-10-CM

## 2017-02-10 LAB — CBC WITH DIFFERENTIAL/PLATELET
BASO%: 0 % (ref 0.0–2.0)
BASOS ABS: 0 10*3/uL (ref 0.0–0.1)
EOS%: 0.6 % (ref 0.0–7.0)
Eosinophils Absolute: 0 10*3/uL (ref 0.0–0.5)
HCT: 36.2 % — ABNORMAL LOW (ref 38.4–49.9)
HEMOGLOBIN: 10.6 g/dL — AB (ref 13.0–17.1)
LYMPH%: 54.5 % — ABNORMAL HIGH (ref 14.0–49.0)
MCH: 23.9 pg — ABNORMAL LOW (ref 27.2–33.4)
MCHC: 29.3 g/dL — ABNORMAL LOW (ref 32.0–36.0)
MCV: 81.5 fL (ref 79.3–98.0)
MONO#: 0.4 10*3/uL (ref 0.1–0.9)
MONO%: 12.9 % (ref 0.0–14.0)
NEUT%: 32 % — ABNORMAL LOW (ref 39.0–75.0)
NEUTROS ABS: 1 10*3/uL — AB (ref 1.5–6.5)
NRBC: 0 % (ref 0–0)
Platelets: 102 10*3/uL — ABNORMAL LOW (ref 140–400)
RBC: 4.44 10*6/uL (ref 4.20–5.82)
RDW: 23.6 % — ABNORMAL HIGH (ref 11.0–14.6)
WBC: 3.1 10*3/uL — AB (ref 4.0–10.3)
lymph#: 1.7 10*3/uL (ref 0.9–3.3)

## 2017-02-10 MED ORDER — ROMIPLOSTIM 250 MCG ~~LOC~~ SOLR
170.0000 ug | SUBCUTANEOUS | Status: DC
  Administered 2017-02-10: 170 ug via SUBCUTANEOUS
  Filled 2017-02-10: qty 0.34

## 2017-02-10 NOTE — Patient Instructions (Signed)
Romiplostim injection What is this medicine? ROMIPLOSTIM (roe mi PLOE stim) helps your body make more platelets. This medicine is used to treat low platelets caused by chronic idiopathic thrombocytopenic purpura (ITP). This medicine may be used for other purposes; ask your health care provider or pharmacist if you have questions. COMMON BRAND NAME(S): Nplate What should I tell my health care provider before I take this medicine? They need to know if you have any of these conditions: -cancer or myelodysplastic syndrome -low blood counts, like low white cell, platelet, or red cell counts -take medicines that treat or prevent blood clots -an unusual or allergic reaction to romiplostim, mannitol, other medicines, foods, dyes, or preservatives -pregnant or trying to get pregnant -breast-feeding How should I use this medicine? This medicine is for injection under the skin. It is given by a health care professional in a hospital or clinic setting. A special MedGuide will be given to you before your injection. Read this information carefully each time. Talk to your pediatrician regarding the use of this medicine in children. Special care may be needed. Overdosage: If you think you have taken too much of this medicine contact a poison control center or emergency room at once. NOTE: This medicine is only for you. Do not share this medicine with others. What if I miss a dose? It is important not to miss your dose. Call your doctor or health care professional if you are unable to keep an appointment. What may interact with this medicine? Interactions are not expected. This list may not describe all possible interactions. Give your health care provider a list of all the medicines, herbs, non-prescription drugs, or dietary supplements you use. Also tell them if you smoke, drink alcohol, or use illegal drugs. Some items may interact with your medicine. What should I watch for while using this  medicine? Your condition will be monitored carefully while you are receiving this medicine. Visit your prescriber or health care professional for regular checks on your progress and for the needed blood tests. It is important to keep all appointments. What side effects may I notice from receiving this medicine? Side effects that you should report to your doctor or health care professional as soon as possible: -allergic reactions like skin rash, itching or hives, swelling of the face, lips, or tongue -shortness of breath, chest pain, swelling in a leg -unusual bleeding or bruising Side effects that usually do not require medical attention (report to your doctor or health care professional if they continue or are bothersome): -dizziness -headache -muscle aches -pain in arms and legs -stomach pain -trouble sleeping This list may not describe all possible side effects. Call your doctor for medical advice about side effects. You may report side effects to FDA at 1-800-FDA-1088. Where should I keep my medicine? This drug is given in a hospital or clinic and will not be stored at home. NOTE: This sheet is a summary. It may not cover all possible information. If you have questions about this medicine, talk to your doctor, pharmacist, or health care provider.  2018 Elsevier/Gold Standard (2008-02-05 15:13:04)  

## 2017-02-17 ENCOUNTER — Other Ambulatory Visit (HOSPITAL_BASED_OUTPATIENT_CLINIC_OR_DEPARTMENT_OTHER): Payer: Medicare Other

## 2017-02-17 ENCOUNTER — Ambulatory Visit (HOSPITAL_BASED_OUTPATIENT_CLINIC_OR_DEPARTMENT_OTHER): Payer: Medicare Other

## 2017-02-17 VITALS — BP 136/56 | HR 63 | Temp 98.0°F | Resp 18

## 2017-02-17 DIAGNOSIS — N183 Chronic kidney disease, stage 3 unspecified: Secondary | ICD-10-CM

## 2017-02-17 DIAGNOSIS — D46C Myelodysplastic syndrome with isolated del(5q) chromosomal abnormality: Secondary | ICD-10-CM

## 2017-02-17 DIAGNOSIS — D693 Immune thrombocytopenic purpura: Secondary | ICD-10-CM | POA: Diagnosis present

## 2017-02-17 DIAGNOSIS — D631 Anemia in chronic kidney disease: Secondary | ICD-10-CM

## 2017-02-17 DIAGNOSIS — D539 Nutritional anemia, unspecified: Secondary | ICD-10-CM

## 2017-02-17 DIAGNOSIS — D696 Thrombocytopenia, unspecified: Secondary | ICD-10-CM

## 2017-02-17 LAB — CBC WITH DIFFERENTIAL/PLATELET
BASO%: 0.3 % (ref 0.0–2.0)
BASOS ABS: 0 10*3/uL (ref 0.0–0.1)
EOS ABS: 0 10*3/uL (ref 0.0–0.5)
EOS%: 0.8 % (ref 0.0–7.0)
HEMATOCRIT: 36.6 % — AB (ref 38.4–49.9)
HEMOGLOBIN: 10.9 g/dL — AB (ref 13.0–17.1)
LYMPH%: 53.6 % — AB (ref 14.0–49.0)
MCH: 24.2 pg — AB (ref 27.2–33.4)
MCHC: 29.8 g/dL — AB (ref 32.0–36.0)
MCV: 81.2 fL (ref 79.3–98.0)
MONO#: 0.4 10*3/uL (ref 0.1–0.9)
MONO%: 9.8 % (ref 0.0–14.0)
NEUT#: 1.3 10*3/uL — ABNORMAL LOW (ref 1.5–6.5)
NEUT%: 35.5 % — AB (ref 39.0–75.0)
RBC: 4.51 10*6/uL (ref 4.20–5.82)
RDW: 23.8 % — ABNORMAL HIGH (ref 11.0–14.6)
WBC: 3.7 10*3/uL — ABNORMAL LOW (ref 4.0–10.3)
lymph#: 2 10*3/uL (ref 0.9–3.3)
nRBC: 0 % (ref 0–0)

## 2017-02-17 LAB — TECHNOLOGIST REVIEW

## 2017-02-17 MED ORDER — ROMIPLOSTIM 250 MCG ~~LOC~~ SOLR
170.0000 ug | SUBCUTANEOUS | Status: DC
  Administered 2017-02-17: 170 ug via SUBCUTANEOUS
  Filled 2017-02-17: qty 0.34

## 2017-02-17 MED ORDER — CYANOCOBALAMIN 1000 MCG/ML IJ SOLN
1000.0000 ug | Freq: Once | INTRAMUSCULAR | Status: AC
  Administered 2017-02-17: 1000 ug via INTRAMUSCULAR

## 2017-02-17 NOTE — Patient Instructions (Signed)
Romiplostim injection What is this medicine? ROMIPLOSTIM (roe mi PLOE stim) helps your body make more platelets. This medicine is used to treat low platelets caused by chronic idiopathic thrombocytopenic purpura (ITP). This medicine may be used for other purposes; ask your health care provider or pharmacist if you have questions. COMMON BRAND NAME(S): Nplate What should I tell my health care provider before I take this medicine? They need to know if you have any of these conditions: -cancer or myelodysplastic syndrome -low blood counts, like low white cell, platelet, or red cell counts -take medicines that treat or prevent blood clots -an unusual or allergic reaction to romiplostim, mannitol, other medicines, foods, dyes, or preservatives -pregnant or trying to get pregnant -breast-feeding How should I use this medicine? This medicine is for injection under the skin. It is given by a health care professional in a hospital or clinic setting. A special MedGuide will be given to you before your injection. Read this information carefully each time. Talk to your pediatrician regarding the use of this medicine in children. Special care may be needed. Overdosage: If you think you have taken too much of this medicine contact a poison control center or emergency room at once. NOTE: This medicine is only for you. Do not share this medicine with others. What if I miss a dose? It is important not to miss your dose. Call your doctor or health care professional if you are unable to keep an appointment. What may interact with this medicine? Interactions are not expected. This list may not describe all possible interactions. Give your health care provider a list of all the medicines, herbs, non-prescription drugs, or dietary supplements you use. Also tell them if you smoke, drink alcohol, or use illegal drugs. Some items may interact with your medicine. What should I watch for while using this  medicine? Your condition will be monitored carefully while you are receiving this medicine. Visit your prescriber or health care professional for regular checks on your progress and for the needed blood tests. It is important to keep all appointments. What side effects may I notice from receiving this medicine? Side effects that you should report to your doctor or health care professional as soon as possible: -allergic reactions like skin rash, itching or hives, swelling of the face, lips, or tongue -shortness of breath, chest pain, swelling in a leg -unusual bleeding or bruising Side effects that usually do not require medical attention (report to your doctor or health care professional if they continue or are bothersome): -dizziness -headache -muscle aches -pain in arms and legs -stomach pain -trouble sleeping This list may not describe all possible side effects. Call your doctor for medical advice about side effects. You may report side effects to FDA at 1-800-FDA-1088. Where should I keep my medicine? This drug is given in a hospital or clinic and will not be stored at home. NOTE: This sheet is a summary. It may not cover all possible information. If you have questions about this medicine, talk to your doctor, pharmacist, or health care provider.  2018 Elsevier/Gold Standard (2008-02-05 15:13:04) Cyanocobalamin, Vitamin B12 injection What is this medicine? CYANOCOBALAMIN (sye an oh koe BAL a min) is a man made form of vitamin B12. Vitamin B12 is used in the growth of healthy blood cells, nerve cells, and proteins in the body. It also helps with the metabolism of fats and carbohydrates. This medicine is used to treat people who can not absorb vitamin B12. This medicine may be used   for other purposes; ask your health care provider or pharmacist if you have questions. COMMON BRAND NAME(S): B-12 Compliance Kit, B-12 Injection Kit, Cyomin, LA-12, Nutri-Twelve, Physicians EZ Use B-12,  Primabalt What should I tell my health care provider before I take this medicine? They need to know if you have any of these conditions: -kidney disease -Leber's disease -megaloblastic anemia -an unusual or allergic reaction to cyanocobalamin, cobalt, other medicines, foods, dyes, or preservatives -pregnant or trying to get pregnant -breast-feeding How should I use this medicine? This medicine is injected into a muscle or deeply under the skin. It is usually given by a health care professional in a clinic or doctor's office. However, your doctor may teach you how to inject yourself. Follow all instructions. Talk to your pediatrician regarding the use of this medicine in children. Special care may be needed. Overdosage: If you think you have taken too much of this medicine contact a poison control center or emergency room at once. NOTE: This medicine is only for you. Do not share this medicine with others. What if I miss a dose? If you are given your dose at a clinic or doctor's office, call to reschedule your appointment. If you give your own injections and you miss a dose, take it as soon as you can. If it is almost time for your next dose, take only that dose. Do not take double or extra doses. What may interact with this medicine? -colchicine -heavy alcohol intake This list may not describe all possible interactions. Give your health care provider a list of all the medicines, herbs, non-prescription drugs, or dietary supplements you use. Also tell them if you smoke, drink alcohol, or use illegal drugs. Some items may interact with your medicine. What should I watch for while using this medicine? Visit your doctor or health care professional regularly. You may need blood work done while you are taking this medicine. You may need to follow a special diet. Talk to your doctor. Limit your alcohol intake and avoid smoking to get the best benefit. What side effects may I notice from receiving  this medicine? Side effects that you should report to your doctor or health care professional as soon as possible: -allergic reactions like skin rash, itching or hives, swelling of the face, lips, or tongue -blue tint to skin -chest tightness, pain -difficulty breathing, wheezing -dizziness -red, swollen painful area on the leg Side effects that usually do not require medical attention (report to your doctor or health care professional if they continue or are bothersome): -diarrhea -headache This list may not describe all possible side effects. Call your doctor for medical advice about side effects. You may report side effects to FDA at 1-800-FDA-1088. Where should I keep my medicine? Keep out of the reach of children. Store at room temperature between 15 and 30 degrees C (59 and 85 degrees F). Protect from light. Throw away any unused medicine after the expiration date. NOTE: This sheet is a summary. It may not cover all possible information. If you have questions about this medicine, talk to your doctor, pharmacist, or health care provider.  2018 Elsevier/Gold Standard (2007-09-18 22:10:20)

## 2017-02-24 ENCOUNTER — Other Ambulatory Visit (HOSPITAL_BASED_OUTPATIENT_CLINIC_OR_DEPARTMENT_OTHER): Payer: Medicare Other

## 2017-02-24 ENCOUNTER — Ambulatory Visit (HOSPITAL_BASED_OUTPATIENT_CLINIC_OR_DEPARTMENT_OTHER): Payer: Medicare Other

## 2017-02-24 VITALS — BP 147/63 | HR 65 | Temp 97.2°F | Resp 20

## 2017-02-24 DIAGNOSIS — D631 Anemia in chronic kidney disease: Secondary | ICD-10-CM

## 2017-02-24 DIAGNOSIS — D693 Immune thrombocytopenic purpura: Secondary | ICD-10-CM | POA: Diagnosis present

## 2017-02-24 DIAGNOSIS — D539 Nutritional anemia, unspecified: Secondary | ICD-10-CM

## 2017-02-24 DIAGNOSIS — D46C Myelodysplastic syndrome with isolated del(5q) chromosomal abnormality: Secondary | ICD-10-CM

## 2017-02-24 DIAGNOSIS — N183 Chronic kidney disease, stage 3 unspecified: Secondary | ICD-10-CM

## 2017-02-24 DIAGNOSIS — D696 Thrombocytopenia, unspecified: Secondary | ICD-10-CM

## 2017-02-24 LAB — CBC WITH DIFFERENTIAL/PLATELET
BASO%: 0 % (ref 0.0–2.0)
BASOS ABS: 0 10*3/uL (ref 0.0–0.1)
EOS%: 0.9 % (ref 0.0–7.0)
Eosinophils Absolute: 0 10*3/uL (ref 0.0–0.5)
HCT: 36.4 % — ABNORMAL LOW (ref 38.4–49.9)
HEMOGLOBIN: 10.7 g/dL — AB (ref 13.0–17.1)
LYMPH#: 1.9 10*3/uL (ref 0.9–3.3)
LYMPH%: 53.1 % — ABNORMAL HIGH (ref 14.0–49.0)
MCH: 24 pg — AB (ref 27.2–33.4)
MCHC: 29.4 g/dL — ABNORMAL LOW (ref 32.0–36.0)
MCV: 81.6 fL (ref 79.3–98.0)
MONO#: 0.4 10*3/uL (ref 0.1–0.9)
MONO%: 10.8 % (ref 0.0–14.0)
NEUT#: 1.2 10*3/uL — ABNORMAL LOW (ref 1.5–6.5)
NEUT%: 35.2 % — ABNORMAL LOW (ref 39.0–75.0)
NRBC: 0 % (ref 0–0)
Platelets: 78 10*3/uL — ABNORMAL LOW (ref 140–400)
RBC: 4.46 10*6/uL (ref 4.20–5.82)
RDW: 23.9 % — AB (ref 11.0–14.6)
WBC: 3.5 10*3/uL — ABNORMAL LOW (ref 4.0–10.3)

## 2017-02-24 LAB — TECHNOLOGIST REVIEW

## 2017-02-24 MED ORDER — ROMIPLOSTIM 250 MCG ~~LOC~~ SOLR
170.0000 ug | SUBCUTANEOUS | Status: DC
  Administered 2017-02-24: 170 ug via SUBCUTANEOUS
  Filled 2017-02-24: qty 0.34

## 2017-02-24 NOTE — Patient Instructions (Signed)
Romiplostim injection What is this medicine? ROMIPLOSTIM (roe mi PLOE stim) helps your body make more platelets. This medicine is used to treat low platelets caused by chronic idiopathic thrombocytopenic purpura (ITP). This medicine may be used for other purposes; ask your health care provider or pharmacist if you have questions. COMMON BRAND NAME(S): Nplate What should I tell my health care provider before I take this medicine? They need to know if you have any of these conditions: -cancer or myelodysplastic syndrome -low blood counts, like low white cell, platelet, or red cell counts -take medicines that treat or prevent blood clots -an unusual or allergic reaction to romiplostim, mannitol, other medicines, foods, dyes, or preservatives -pregnant or trying to get pregnant -breast-feeding How should I use this medicine? This medicine is for injection under the skin. It is given by a health care professional in a hospital or clinic setting. A special MedGuide will be given to you before your injection. Read this information carefully each time. Talk to your pediatrician regarding the use of this medicine in children. Special care may be needed. Overdosage: If you think you have taken too much of this medicine contact a poison control center or emergency room at once. NOTE: This medicine is only for you. Do not share this medicine with others. What if I miss a dose? It is important not to miss your dose. Call your doctor or health care professional if you are unable to keep an appointment. What may interact with this medicine? Interactions are not expected. This list may not describe all possible interactions. Give your health care provider a list of all the medicines, herbs, non-prescription drugs, or dietary supplements you use. Also tell them if you smoke, drink alcohol, or use illegal drugs. Some items may interact with your medicine. What should I watch for while using this  medicine? Your condition will be monitored carefully while you are receiving this medicine. Visit your prescriber or health care professional for regular checks on your progress and for the needed blood tests. It is important to keep all appointments. What side effects may I notice from receiving this medicine? Side effects that you should report to your doctor or health care professional as soon as possible: -allergic reactions like skin rash, itching or hives, swelling of the face, lips, or tongue -shortness of breath, chest pain, swelling in a leg -unusual bleeding or bruising Side effects that usually do not require medical attention (report to your doctor or health care professional if they continue or are bothersome): -dizziness -headache -muscle aches -pain in arms and legs -stomach pain -trouble sleeping This list may not describe all possible side effects. Call your doctor for medical advice about side effects. You may report side effects to FDA at 1-800-FDA-1088. Where should I keep my medicine? This drug is given in a hospital or clinic and will not be stored at home. NOTE: This sheet is a summary. It may not cover all possible information. If you have questions about this medicine, talk to your doctor, pharmacist, or health care provider.  2018 Elsevier/Gold Standard (2008-02-05 15:13:04)  

## 2017-03-03 ENCOUNTER — Ambulatory Visit (HOSPITAL_BASED_OUTPATIENT_CLINIC_OR_DEPARTMENT_OTHER): Payer: Medicare Other

## 2017-03-03 ENCOUNTER — Other Ambulatory Visit (HOSPITAL_BASED_OUTPATIENT_CLINIC_OR_DEPARTMENT_OTHER): Payer: Medicare Other

## 2017-03-03 VITALS — BP 158/61 | HR 55 | Temp 97.5°F | Resp 20

## 2017-03-03 DIAGNOSIS — D696 Thrombocytopenia, unspecified: Secondary | ICD-10-CM

## 2017-03-03 DIAGNOSIS — D693 Immune thrombocytopenic purpura: Secondary | ICD-10-CM

## 2017-03-03 DIAGNOSIS — D46C Myelodysplastic syndrome with isolated del(5q) chromosomal abnormality: Secondary | ICD-10-CM

## 2017-03-03 DIAGNOSIS — N183 Chronic kidney disease, stage 3 unspecified: Secondary | ICD-10-CM

## 2017-03-03 DIAGNOSIS — D631 Anemia in chronic kidney disease: Secondary | ICD-10-CM

## 2017-03-03 DIAGNOSIS — D539 Nutritional anemia, unspecified: Secondary | ICD-10-CM

## 2017-03-03 LAB — CBC WITH DIFFERENTIAL/PLATELET
BASO%: 0.2 % (ref 0.0–2.0)
BASOS ABS: 0 10*3/uL (ref 0.0–0.1)
EOS ABS: 0 10*3/uL (ref 0.0–0.5)
EOS%: 0.6 % (ref 0.0–7.0)
HEMATOCRIT: 38.8 % (ref 38.4–49.9)
HGB: 11.6 g/dL — ABNORMAL LOW (ref 13.0–17.1)
LYMPH%: 45.9 % (ref 14.0–49.0)
MCH: 24.2 pg — AB (ref 27.2–33.4)
MCHC: 29.9 g/dL — AB (ref 32.0–36.0)
MCV: 80.8 fL (ref 79.3–98.0)
MONO#: 0.4 10*3/uL (ref 0.1–0.9)
MONO%: 8.7 % (ref 0.0–14.0)
NEUT#: 2.1 10*3/uL (ref 1.5–6.5)
NEUT%: 44.6 % (ref 39.0–75.0)
Platelets: 69 10*3/uL — ABNORMAL LOW (ref 140–400)
RBC: 4.8 10*6/uL (ref 4.20–5.82)
RDW: 23.4 % — AB (ref 11.0–14.6)
WBC: 4.6 10*3/uL (ref 4.0–10.3)
lymph#: 2.1 10*3/uL (ref 0.9–3.3)
nRBC: 0 % (ref 0–0)

## 2017-03-03 LAB — TECHNOLOGIST REVIEW

## 2017-03-03 MED ORDER — ROMIPLOSTIM 250 MCG ~~LOC~~ SOLR
170.0000 ug | SUBCUTANEOUS | Status: DC
  Administered 2017-03-03: 170 ug via SUBCUTANEOUS
  Filled 2017-03-03: qty 0.34

## 2017-03-03 MED ORDER — DARBEPOETIN ALFA 200 MCG/0.4ML IJ SOSY: 200.0000 ug | PREFILLED_SYRINGE | Freq: Once | INTRAMUSCULAR | Status: DC

## 2017-03-03 NOTE — Patient Instructions (Signed)
Romiplostim injection What is this medicine? ROMIPLOSTIM (roe mi PLOE stim) helps your body make more platelets. This medicine is used to treat low platelets caused by chronic idiopathic thrombocytopenic purpura (ITP). This medicine may be used for other purposes; ask your health care provider or pharmacist if you have questions. COMMON BRAND NAME(S): Nplate What should I tell my health care provider before I take this medicine? They need to know if you have any of these conditions: -cancer or myelodysplastic syndrome -low blood counts, like low white cell, platelet, or red cell counts -take medicines that treat or prevent blood clots -an unusual or allergic reaction to romiplostim, mannitol, other medicines, foods, dyes, or preservatives -pregnant or trying to get pregnant -breast-feeding How should I use this medicine? This medicine is for injection under the skin. It is given by a health care professional in a hospital or clinic setting. A special MedGuide will be given to you before your injection. Read this information carefully each time. Talk to your pediatrician regarding the use of this medicine in children. Special care may be needed. Overdosage: If you think you have taken too much of this medicine contact a poison control center or emergency room at once. NOTE: This medicine is only for you. Do not share this medicine with others. What if I miss a dose? It is important not to miss your dose. Call your doctor or health care professional if you are unable to keep an appointment. What may interact with this medicine? Interactions are not expected. This list may not describe all possible interactions. Give your health care provider a list of all the medicines, herbs, non-prescription drugs, or dietary supplements you use. Also tell them if you smoke, drink alcohol, or use illegal drugs. Some items may interact with your medicine. What should I watch for while using this  medicine? Your condition will be monitored carefully while you are receiving this medicine. Visit your prescriber or health care professional for regular checks on your progress and for the needed blood tests. It is important to keep all appointments. What side effects may I notice from receiving this medicine? Side effects that you should report to your doctor or health care professional as soon as possible: -allergic reactions like skin rash, itching or hives, swelling of the face, lips, or tongue -shortness of breath, chest pain, swelling in a leg -unusual bleeding or bruising Side effects that usually do not require medical attention (report to your doctor or health care professional if they continue or are bothersome): -dizziness -headache -muscle aches -pain in arms and legs -stomach pain -trouble sleeping This list may not describe all possible side effects. Call your doctor for medical advice about side effects. You may report side effects to FDA at 1-800-FDA-1088. Where should I keep my medicine? This drug is given in a hospital or clinic and will not be stored at home. NOTE: This sheet is a summary. It may not cover all possible information. If you have questions about this medicine, talk to your doctor, pharmacist, or health care provider.  2018 Elsevier/Gold Standard (2008-02-05 15:13:04)  

## 2017-03-10 ENCOUNTER — Other Ambulatory Visit (HOSPITAL_BASED_OUTPATIENT_CLINIC_OR_DEPARTMENT_OTHER): Payer: Medicare Other

## 2017-03-10 ENCOUNTER — Ambulatory Visit (HOSPITAL_BASED_OUTPATIENT_CLINIC_OR_DEPARTMENT_OTHER): Payer: Medicare Other

## 2017-03-10 VITALS — BP 138/59 | HR 64 | Temp 97.0°F | Resp 18

## 2017-03-10 DIAGNOSIS — D46C Myelodysplastic syndrome with isolated del(5q) chromosomal abnormality: Secondary | ICD-10-CM | POA: Diagnosis not present

## 2017-03-10 DIAGNOSIS — N183 Chronic kidney disease, stage 3 unspecified: Secondary | ICD-10-CM

## 2017-03-10 DIAGNOSIS — D693 Immune thrombocytopenic purpura: Secondary | ICD-10-CM

## 2017-03-10 DIAGNOSIS — D631 Anemia in chronic kidney disease: Secondary | ICD-10-CM

## 2017-03-10 DIAGNOSIS — D539 Nutritional anemia, unspecified: Secondary | ICD-10-CM

## 2017-03-10 DIAGNOSIS — D696 Thrombocytopenia, unspecified: Secondary | ICD-10-CM

## 2017-03-10 LAB — CBC WITH DIFFERENTIAL/PLATELET
BASO%: 0.2 % (ref 0.0–2.0)
BASOS ABS: 0 10*3/uL (ref 0.0–0.1)
EOS ABS: 0 10*3/uL (ref 0.0–0.5)
EOS%: 0.4 % (ref 0.0–7.0)
HCT: 38.5 % (ref 38.4–49.9)
HEMOGLOBIN: 11.4 g/dL — AB (ref 13.0–17.1)
LYMPH%: 45.1 % (ref 14.0–49.0)
MCH: 24.1 pg — ABNORMAL LOW (ref 27.2–33.4)
MCHC: 29.6 g/dL — ABNORMAL LOW (ref 32.0–36.0)
MCV: 81.2 fL (ref 79.3–98.0)
MONO#: 0.5 10*3/uL (ref 0.1–0.9)
MONO%: 8.9 % (ref 0.0–14.0)
NEUT#: 2.3 10*3/uL (ref 1.5–6.5)
NEUT%: 45.4 % (ref 39.0–75.0)
NRBC: 0 % (ref 0–0)
PLATELETS: 81 10*3/uL — AB (ref 140–400)
RBC: 4.74 10*6/uL (ref 4.20–5.82)
RDW: 23.9 % — AB (ref 11.0–14.6)
WBC: 5.1 10*3/uL (ref 4.0–10.3)
lymph#: 2.3 10*3/uL (ref 0.9–3.3)

## 2017-03-10 LAB — TECHNOLOGIST REVIEW

## 2017-03-10 MED ORDER — CYANOCOBALAMIN 1000 MCG/ML IJ SOLN
INTRAMUSCULAR | Status: AC
  Filled 2017-03-10: qty 1

## 2017-03-10 MED ORDER — CYANOCOBALAMIN 1000 MCG/ML IJ SOLN
1000.0000 ug | Freq: Once | INTRAMUSCULAR | Status: AC
  Administered 2017-03-10: 1000 ug via INTRAMUSCULAR

## 2017-03-10 MED ORDER — ROMIPLOSTIM 250 MCG ~~LOC~~ SOLR
170.0000 ug | SUBCUTANEOUS | Status: DC
  Administered 2017-03-10: 170 ug via SUBCUTANEOUS
  Filled 2017-03-10: qty 0.34

## 2017-03-17 ENCOUNTER — Other Ambulatory Visit (HOSPITAL_BASED_OUTPATIENT_CLINIC_OR_DEPARTMENT_OTHER): Payer: Medicare Other

## 2017-03-17 ENCOUNTER — Ambulatory Visit (HOSPITAL_BASED_OUTPATIENT_CLINIC_OR_DEPARTMENT_OTHER): Payer: Medicare Other

## 2017-03-17 VITALS — BP 151/64 | HR 60 | Temp 98.4°F | Resp 18

## 2017-03-17 DIAGNOSIS — D631 Anemia in chronic kidney disease: Secondary | ICD-10-CM

## 2017-03-17 DIAGNOSIS — D693 Immune thrombocytopenic purpura: Secondary | ICD-10-CM | POA: Diagnosis present

## 2017-03-17 DIAGNOSIS — D46C Myelodysplastic syndrome with isolated del(5q) chromosomal abnormality: Secondary | ICD-10-CM

## 2017-03-17 DIAGNOSIS — N183 Chronic kidney disease, stage 3 unspecified: Secondary | ICD-10-CM

## 2017-03-17 DIAGNOSIS — D696 Thrombocytopenia, unspecified: Secondary | ICD-10-CM

## 2017-03-17 DIAGNOSIS — D539 Nutritional anemia, unspecified: Secondary | ICD-10-CM

## 2017-03-17 LAB — MANUAL DIFFERENTIAL
ALC: 2.5 10*3/uL (ref 0.9–3.3)
ANC (CHCC MAN DIFF): 1.7 10*3/uL (ref 1.5–6.5)
BAND NEUTROPHILS: 0 % (ref 0–10)
BASOPHIL: 0 % (ref 0–2)
Blasts: 0 % (ref 0–0)
EOS: 1 % (ref 0–7)
LYMPH: 57 % — ABNORMAL HIGH (ref 14–49)
METAMYELOCYTES PCT: 0 % (ref 0–0)
MONO: 3 % (ref 0–14)
MYELOCYTES: 0 % (ref 0–0)
OTHER CELL: 0 % (ref 0–0)
PLT EST: DECREASED
PROMYELO: 0 % (ref 0–0)
SEG: 39 % (ref 38–77)
Variant Lymph: 0 % (ref 0–0)
nRBC: 0 % (ref 0–0)

## 2017-03-17 LAB — CBC WITH DIFFERENTIAL/PLATELET
HCT: 37.5 % — ABNORMAL LOW (ref 38.4–49.9)
HGB: 11.8 g/dL — ABNORMAL LOW (ref 13.0–17.1)
MCH: 24.5 pg — ABNORMAL LOW (ref 27.2–33.4)
MCHC: 31.5 g/dL — ABNORMAL LOW (ref 32.0–36.0)
MCV: 77.6 fL — ABNORMAL LOW (ref 79.3–98.0)
PLATELETS: 34 10*3/uL — AB (ref 140–400)
RBC: 4.83 10*6/uL (ref 4.20–5.82)
RDW: 25.6 % — ABNORMAL HIGH (ref 11.0–14.6)
WBC: 4.3 10*3/uL (ref 4.0–10.3)

## 2017-03-17 MED ORDER — ROMIPLOSTIM 250 MCG ~~LOC~~ SOLR
250.0000 ug | SUBCUTANEOUS | Status: DC
  Administered 2017-03-17: 250 ug via SUBCUTANEOUS
  Filled 2017-03-17: qty 0.5

## 2017-03-24 ENCOUNTER — Ambulatory Visit (HOSPITAL_BASED_OUTPATIENT_CLINIC_OR_DEPARTMENT_OTHER): Payer: Medicare Other

## 2017-03-24 ENCOUNTER — Other Ambulatory Visit (HOSPITAL_BASED_OUTPATIENT_CLINIC_OR_DEPARTMENT_OTHER): Payer: Medicare Other

## 2017-03-24 VITALS — BP 112/51 | HR 73 | Temp 97.8°F

## 2017-03-24 DIAGNOSIS — D693 Immune thrombocytopenic purpura: Secondary | ICD-10-CM

## 2017-03-24 DIAGNOSIS — N183 Chronic kidney disease, stage 3 unspecified: Secondary | ICD-10-CM

## 2017-03-24 DIAGNOSIS — D539 Nutritional anemia, unspecified: Secondary | ICD-10-CM

## 2017-03-24 DIAGNOSIS — D46C Myelodysplastic syndrome with isolated del(5q) chromosomal abnormality: Secondary | ICD-10-CM

## 2017-03-24 DIAGNOSIS — D696 Thrombocytopenia, unspecified: Secondary | ICD-10-CM

## 2017-03-24 DIAGNOSIS — D631 Anemia in chronic kidney disease: Secondary | ICD-10-CM

## 2017-03-24 LAB — CBC WITH DIFFERENTIAL/PLATELET
BASO%: 0.2 % (ref 0.0–2.0)
Basophils Absolute: 0 10*3/uL (ref 0.0–0.1)
EOS%: 0.9 % (ref 0.0–7.0)
Eosinophils Absolute: 0 10*3/uL (ref 0.0–0.5)
HCT: 37.6 % — ABNORMAL LOW (ref 38.4–49.9)
HGB: 11.2 g/dL — ABNORMAL LOW (ref 13.0–17.1)
LYMPH%: 50.2 % — AB (ref 14.0–49.0)
MCH: 24.2 pg — AB (ref 27.2–33.4)
MCHC: 29.8 g/dL — AB (ref 32.0–36.0)
MCV: 81.2 fL (ref 79.3–98.0)
MONO#: 0.6 10*3/uL (ref 0.1–0.9)
MONO%: 12.8 % (ref 0.0–14.0)
NEUT%: 35.9 % — AB (ref 39.0–75.0)
NEUTROS ABS: 1.6 10*3/uL (ref 1.5–6.5)
Platelets: 63 10*3/uL — ABNORMAL LOW (ref 140–400)
RBC: 4.63 10*6/uL (ref 4.20–5.82)
RDW: 24.1 % — ABNORMAL HIGH (ref 11.0–14.6)
WBC: 4.4 10*3/uL (ref 4.0–10.3)
lymph#: 2.2 10*3/uL (ref 0.9–3.3)
nRBC: 0 % (ref 0–0)

## 2017-03-24 LAB — TECHNOLOGIST REVIEW

## 2017-03-24 MED ORDER — ROMIPLOSTIM 250 MCG ~~LOC~~ SOLR
250.0000 ug | SUBCUTANEOUS | Status: DC
  Administered 2017-03-24: 250 ug via SUBCUTANEOUS
  Filled 2017-03-24: qty 0.5

## 2017-03-24 NOTE — Patient Instructions (Signed)
Romiplostim injection What is this medicine? ROMIPLOSTIM (roe mi PLOE stim) helps your body make more platelets. This medicine is used to treat low platelets caused by chronic idiopathic thrombocytopenic purpura (ITP). This medicine may be used for other purposes; ask your health care provider or pharmacist if you have questions. COMMON BRAND NAME(S): Nplate What should I tell my health care provider before I take this medicine? They need to know if you have any of these conditions: -cancer or myelodysplastic syndrome -low blood counts, like low white cell, platelet, or red cell counts -take medicines that treat or prevent blood clots -an unusual or allergic reaction to romiplostim, mannitol, other medicines, foods, dyes, or preservatives -pregnant or trying to get pregnant -breast-feeding How should I use this medicine? This medicine is for injection under the skin. It is given by a health care professional in a hospital or clinic setting. A special MedGuide will be given to you before your injection. Read this information carefully each time. Talk to your pediatrician regarding the use of this medicine in children. Special care may be needed. Overdosage: If you think you have taken too much of this medicine contact a poison control center or emergency room at once. NOTE: This medicine is only for you. Do not share this medicine with others. What if I miss a dose? It is important not to miss your dose. Call your doctor or health care professional if you are unable to keep an appointment. What may interact with this medicine? Interactions are not expected. This list may not describe all possible interactions. Give your health care provider a list of all the medicines, herbs, non-prescription drugs, or dietary supplements you use. Also tell them if you smoke, drink alcohol, or use illegal drugs. Some items may interact with your medicine. What should I watch for while using this  medicine? Your condition will be monitored carefully while you are receiving this medicine. Visit your prescriber or health care professional for regular checks on your progress and for the needed blood tests. It is important to keep all appointments. What side effects may I notice from receiving this medicine? Side effects that you should report to your doctor or health care professional as soon as possible: -allergic reactions like skin rash, itching or hives, swelling of the face, lips, or tongue -shortness of breath, chest pain, swelling in a leg -unusual bleeding or bruising Side effects that usually do not require medical attention (report to your doctor or health care professional if they continue or are bothersome): -dizziness -headache -muscle aches -pain in arms and legs -stomach pain -trouble sleeping This list may not describe all possible side effects. Call your doctor for medical advice about side effects. You may report side effects to FDA at 1-800-FDA-1088. Where should I keep my medicine? This drug is given in a hospital or clinic and will not be stored at home. NOTE: This sheet is a summary. It may not cover all possible information. If you have questions about this medicine, talk to your doctor, pharmacist, or health care provider.  2018 Elsevier/Gold Standard (2008-02-05 15:13:04)  

## 2017-03-28 ENCOUNTER — Encounter: Payer: Self-pay | Admitting: Family Medicine

## 2017-03-28 DIAGNOSIS — H43812 Vitreous degeneration, left eye: Secondary | ICD-10-CM | POA: Diagnosis not present

## 2017-03-28 DIAGNOSIS — H43811 Vitreous degeneration, right eye: Secondary | ICD-10-CM | POA: Diagnosis not present

## 2017-03-28 DIAGNOSIS — E119 Type 2 diabetes mellitus without complications: Secondary | ICD-10-CM | POA: Diagnosis not present

## 2017-03-28 DIAGNOSIS — H1013 Acute atopic conjunctivitis, bilateral: Secondary | ICD-10-CM | POA: Diagnosis not present

## 2017-03-28 LAB — HM DIABETES EYE EXAM

## 2017-03-31 ENCOUNTER — Other Ambulatory Visit (HOSPITAL_BASED_OUTPATIENT_CLINIC_OR_DEPARTMENT_OTHER): Payer: Medicare Other

## 2017-03-31 ENCOUNTER — Ambulatory Visit (HOSPITAL_BASED_OUTPATIENT_CLINIC_OR_DEPARTMENT_OTHER): Payer: Medicare Other

## 2017-03-31 VITALS — BP 150/62 | HR 70 | Temp 97.7°F | Resp 18

## 2017-03-31 DIAGNOSIS — N183 Chronic kidney disease, stage 3 unspecified: Secondary | ICD-10-CM

## 2017-03-31 DIAGNOSIS — D693 Immune thrombocytopenic purpura: Secondary | ICD-10-CM

## 2017-03-31 DIAGNOSIS — D46C Myelodysplastic syndrome with isolated del(5q) chromosomal abnormality: Secondary | ICD-10-CM

## 2017-03-31 DIAGNOSIS — D631 Anemia in chronic kidney disease: Secondary | ICD-10-CM

## 2017-03-31 DIAGNOSIS — D539 Nutritional anemia, unspecified: Secondary | ICD-10-CM

## 2017-03-31 DIAGNOSIS — D696 Thrombocytopenia, unspecified: Secondary | ICD-10-CM

## 2017-03-31 LAB — CBC WITH DIFFERENTIAL/PLATELET
BASO%: 0.3 % (ref 0.0–2.0)
BASOS ABS: 0 10*3/uL (ref 0.0–0.1)
EOS%: 1.1 % (ref 0.0–7.0)
Eosinophils Absolute: 0 10*3/uL (ref 0.0–0.5)
HEMATOCRIT: 35.8 % — AB (ref 38.4–49.9)
HEMOGLOBIN: 10.9 g/dL — AB (ref 13.0–17.1)
LYMPH#: 1.8 10*3/uL (ref 0.9–3.3)
LYMPH%: 51.3 % — ABNORMAL HIGH (ref 14.0–49.0)
MCH: 24.2 pg — AB (ref 27.2–33.4)
MCHC: 30.4 g/dL — AB (ref 32.0–36.0)
MCV: 79.6 fL (ref 79.3–98.0)
MONO#: 0.4 10*3/uL (ref 0.1–0.9)
MONO%: 11.5 % (ref 0.0–14.0)
NEUT#: 1.3 10*3/uL — ABNORMAL LOW (ref 1.5–6.5)
NEUT%: 35.8 % — AB (ref 39.0–75.0)
NRBC: 1 % — AB (ref 0–0)
Platelets: 81 10*3/uL — ABNORMAL LOW (ref 140–400)
RBC: 4.5 10*6/uL (ref 4.20–5.82)
RDW: 23.7 % — AB (ref 11.0–14.6)
WBC: 3.5 10*3/uL — ABNORMAL LOW (ref 4.0–10.3)

## 2017-03-31 LAB — TECHNOLOGIST REVIEW

## 2017-03-31 MED ORDER — ROMIPLOSTIM 250 MCG ~~LOC~~ SOLR
250.0000 ug | SUBCUTANEOUS | Status: DC
  Administered 2017-03-31: 250 ug via SUBCUTANEOUS
  Filled 2017-03-31: qty 0.5

## 2017-03-31 NOTE — Patient Instructions (Signed)
Romiplostim injection What is this medicine? ROMIPLOSTIM (roe mi PLOE stim) helps your body make more platelets. This medicine is used to treat low platelets caused by chronic idiopathic thrombocytopenic purpura (ITP). This medicine may be used for other purposes; ask your health care provider or pharmacist if you have questions. COMMON BRAND NAME(S): Nplate What should I tell my health care provider before I take this medicine? They need to know if you have any of these conditions: -cancer or myelodysplastic syndrome -low blood counts, like low white cell, platelet, or red cell counts -take medicines that treat or prevent blood clots -an unusual or allergic reaction to romiplostim, mannitol, other medicines, foods, dyes, or preservatives -pregnant or trying to get pregnant -breast-feeding How should I use this medicine? This medicine is for injection under the skin. It is given by a health care professional in a hospital or clinic setting. A special MedGuide will be given to you before your injection. Read this information carefully each time. Talk to your pediatrician regarding the use of this medicine in children. Special care may be needed. Overdosage: If you think you have taken too much of this medicine contact a poison control center or emergency room at once. NOTE: This medicine is only for you. Do not share this medicine with others. What if I miss a dose? It is important not to miss your dose. Call your doctor or health care professional if you are unable to keep an appointment. What may interact with this medicine? Interactions are not expected. This list may not describe all possible interactions. Give your health care provider a list of all the medicines, herbs, non-prescription drugs, or dietary supplements you use. Also tell them if you smoke, drink alcohol, or use illegal drugs. Some items may interact with your medicine. What should I watch for while using this  medicine? Your condition will be monitored carefully while you are receiving this medicine. Visit your prescriber or health care professional for regular checks on your progress and for the needed blood tests. It is important to keep all appointments. What side effects may I notice from receiving this medicine? Side effects that you should report to your doctor or health care professional as soon as possible: -allergic reactions like skin rash, itching or hives, swelling of the face, lips, or tongue -shortness of breath, chest pain, swelling in a leg -unusual bleeding or bruising Side effects that usually do not require medical attention (report to your doctor or health care professional if they continue or are bothersome): -dizziness -headache -muscle aches -pain in arms and legs -stomach pain -trouble sleeping This list may not describe all possible side effects. Call your doctor for medical advice about side effects. You may report side effects to FDA at 1-800-FDA-1088. Where should I keep my medicine? This drug is given in a hospital or clinic and will not be stored at home. NOTE: This sheet is a summary. It may not cover all possible information. If you have questions about this medicine, talk to your doctor, pharmacist, or health care provider.  2018 Elsevier/Gold Standard (2008-02-05 15:13:04)  

## 2017-04-04 ENCOUNTER — Ambulatory Visit (INDEPENDENT_AMBULATORY_CARE_PROVIDER_SITE_OTHER): Payer: Medicare Other | Admitting: Family Medicine

## 2017-04-04 DIAGNOSIS — Z23 Encounter for immunization: Secondary | ICD-10-CM | POA: Diagnosis not present

## 2017-04-07 ENCOUNTER — Other Ambulatory Visit (HOSPITAL_BASED_OUTPATIENT_CLINIC_OR_DEPARTMENT_OTHER): Payer: Medicare Other

## 2017-04-07 ENCOUNTER — Ambulatory Visit (HOSPITAL_BASED_OUTPATIENT_CLINIC_OR_DEPARTMENT_OTHER): Payer: Medicare Other

## 2017-04-07 VITALS — BP 141/65 | HR 58 | Temp 97.4°F | Resp 20

## 2017-04-07 DIAGNOSIS — D696 Thrombocytopenia, unspecified: Secondary | ICD-10-CM

## 2017-04-07 DIAGNOSIS — D631 Anemia in chronic kidney disease: Secondary | ICD-10-CM

## 2017-04-07 DIAGNOSIS — D46C Myelodysplastic syndrome with isolated del(5q) chromosomal abnormality: Secondary | ICD-10-CM

## 2017-04-07 DIAGNOSIS — N183 Chronic kidney disease, stage 3 unspecified: Secondary | ICD-10-CM

## 2017-04-07 DIAGNOSIS — D693 Immune thrombocytopenic purpura: Secondary | ICD-10-CM | POA: Diagnosis present

## 2017-04-07 DIAGNOSIS — D539 Nutritional anemia, unspecified: Secondary | ICD-10-CM

## 2017-04-07 LAB — CBC WITH DIFFERENTIAL/PLATELET
BASO%: 0.2 % (ref 0.0–2.0)
Basophils Absolute: 0 10*3/uL (ref 0.0–0.1)
EOS%: 1.2 % (ref 0.0–7.0)
Eosinophils Absolute: 0.1 10*3/uL (ref 0.0–0.5)
HEMATOCRIT: 36.7 % — AB (ref 38.4–49.9)
HEMOGLOBIN: 11 g/dL — AB (ref 13.0–17.1)
LYMPH#: 2 10*3/uL (ref 0.9–3.3)
LYMPH%: 49.1 % — ABNORMAL HIGH (ref 14.0–49.0)
MCH: 24.1 pg — AB (ref 27.2–33.4)
MCHC: 30 g/dL — AB (ref 32.0–36.0)
MCV: 80.3 fL (ref 79.3–98.0)
MONO#: 0.5 10*3/uL (ref 0.1–0.9)
MONO%: 11.5 % (ref 0.0–14.0)
NEUT%: 38 % — ABNORMAL LOW (ref 39.0–75.0)
NEUTROS ABS: 1.5 10*3/uL (ref 1.5–6.5)
PLATELETS: 73 10*3/uL — AB (ref 140–400)
RBC: 4.57 10*6/uL (ref 4.20–5.82)
RDW: 23.8 % — ABNORMAL HIGH (ref 11.0–14.6)
WBC: 4 10*3/uL (ref 4.0–10.3)
nRBC: 1 % — ABNORMAL HIGH (ref 0–0)

## 2017-04-07 LAB — TECHNOLOGIST REVIEW: Technologist Review: 1

## 2017-04-07 MED ORDER — CYANOCOBALAMIN 1000 MCG/ML IJ SOLN
1000.0000 ug | Freq: Once | INTRAMUSCULAR | Status: AC
  Administered 2017-04-07: 1000 ug via INTRAMUSCULAR

## 2017-04-07 MED ORDER — ROMIPLOSTIM 250 MCG ~~LOC~~ SOLR
250.0000 ug | SUBCUTANEOUS | Status: DC
  Administered 2017-04-07: 250 ug via SUBCUTANEOUS
  Filled 2017-04-07: qty 0.5

## 2017-04-07 NOTE — Progress Notes (Unsigned)
corrected report on Lejeune Oma given to RN cathy at 4.50pm. ku

## 2017-04-07 NOTE — Patient Instructions (Signed)
Cyanocobalamin, Vitamin B12 injection What is this medicine? CYANOCOBALAMIN (sye an oh koe BAL a min) is a man made form of vitamin B12. Vitamin B12 is used in the growth of healthy blood cells, nerve cells, and proteins in the body. It also helps with the metabolism of fats and carbohydrates. This medicine is used to treat people who can not absorb vitamin B12. This medicine may be used for other purposes; ask your health care provider or pharmacist if you have questions. COMMON BRAND NAME(S): B-12 Compliance Kit, B-12 Injection Kit, Cyomin, LA-12, Nutri-Twelve, Physicians EZ Use B-12, Primabalt What should I tell my health care provider before I take this medicine? They need to know if you have any of these conditions: -kidney disease -Leber's disease -megaloblastic anemia -an unusual or allergic reaction to cyanocobalamin, cobalt, other medicines, foods, dyes, or preservatives -pregnant or trying to get pregnant -breast-feeding How should I use this medicine? This medicine is injected into a muscle or deeply under the skin. It is usually given by a health care professional in a clinic or doctor's office. However, your doctor may teach you how to inject yourself. Follow all instructions. Talk to your pediatrician regarding the use of this medicine in children. Special care may be needed. Overdosage: If you think you have taken too much of this medicine contact a poison control center or emergency room at once. NOTE: This medicine is only for you. Do not share this medicine with others. What if I miss a dose? If you are given your dose at a clinic or doctor's office, call to reschedule your appointment. If you give your own injections and you miss a dose, take it as soon as you can. If it is almost time for your next dose, take only that dose. Do not take double or extra doses. What may interact with this medicine? -colchicine -heavy alcohol intake This list may not describe all possible  interactions. Give your health care provider a list of all the medicines, herbs, non-prescription drugs, or dietary supplements you use. Also tell them if you smoke, drink alcohol, or use illegal drugs. Some items may interact with your medicine. What should I watch for while using this medicine? Visit your doctor or health care professional regularly. You may need blood work done while you are taking this medicine. You may need to follow a special diet. Talk to your doctor. Limit your alcohol intake and avoid smoking to get the best benefit. What side effects may I notice from receiving this medicine? Side effects that you should report to your doctor or health care professional as soon as possible: -allergic reactions like skin rash, itching or hives, swelling of the face, lips, or tongue -blue tint to skin -chest tightness, pain -difficulty breathing, wheezing -dizziness -red, swollen painful area on the leg Side effects that usually do not require medical attention (report to your doctor or health care professional if they continue or are bothersome): -diarrhea -headache This list may not describe all possible side effects. Call your doctor for medical advice about side effects. You may report side effects to FDA at 1-800-FDA-1088. Where should I keep my medicine? Keep out of the reach of children. Store at room temperature between 15 and 30 degrees C (59 and 85 degrees F). Protect from light. Throw away any unused medicine after the expiration date. NOTE: This sheet is a summary. It may not cover all possible information. If you have questions about this medicine, talk to your doctor, pharmacist, or   health care provider.  2018 Elsevier/Gold Standard (2007-09-18 22:10:20) Romiplostim injection What is this medicine? ROMIPLOSTIM (roe mi PLOE stim) helps your body make more platelets. This medicine is used to treat low platelets caused by chronic idiopathic thrombocytopenic purpura  (ITP). This medicine may be used for other purposes; ask your health care provider or pharmacist if you have questions. COMMON BRAND NAME(S): Nplate What should I tell my health care provider before I take this medicine? They need to know if you have any of these conditions: -cancer or myelodysplastic syndrome -low blood counts, like low white cell, platelet, or red cell counts -take medicines that treat or prevent blood clots -an unusual or allergic reaction to romiplostim, mannitol, other medicines, foods, dyes, or preservatives -pregnant or trying to get pregnant -breast-feeding How should I use this medicine? This medicine is for injection under the skin. It is given by a health care professional in a hospital or clinic setting. A special MedGuide will be given to you before your injection. Read this information carefully each time. Talk to your pediatrician regarding the use of this medicine in children. Special care may be needed. Overdosage: If you think you have taken too much of this medicine contact a poison control center or emergency room at once. NOTE: This medicine is only for you. Do not share this medicine with others. What if I miss a dose? It is important not to miss your dose. Call your doctor or health care professional if you are unable to keep an appointment. What may interact with this medicine? Interactions are not expected. This list may not describe all possible interactions. Give your health care provider a list of all the medicines, herbs, non-prescription drugs, or dietary supplements you use. Also tell them if you smoke, drink alcohol, or use illegal drugs. Some items may interact with your medicine. What should I watch for while using this medicine? Your condition will be monitored carefully while you are receiving this medicine. Visit your prescriber or health care professional for regular checks on your progress and for the needed blood tests. It is important  to keep all appointments. What side effects may I notice from receiving this medicine? Side effects that you should report to your doctor or health care professional as soon as possible: -allergic reactions like skin rash, itching or hives, swelling of the face, lips, or tongue -shortness of breath, chest pain, swelling in a leg -unusual bleeding or bruising Side effects that usually do not require medical attention (report to your doctor or health care professional if they continue or are bothersome): -dizziness -headache -muscle aches -pain in arms and legs -stomach pain -trouble sleeping This list may not describe all possible side effects. Call your doctor for medical advice about side effects. You may report side effects to FDA at 1-800-FDA-1088. Where should I keep my medicine? This drug is given in a hospital or clinic and will not be stored at home. NOTE: This sheet is a summary. It may not cover all possible information. If you have questions about this medicine, talk to your doctor, pharmacist, or health care provider.  2018 Elsevier/Gold Standard (2008-02-05 15:13:04)

## 2017-04-14 ENCOUNTER — Other Ambulatory Visit (HOSPITAL_BASED_OUTPATIENT_CLINIC_OR_DEPARTMENT_OTHER): Payer: Medicare Other

## 2017-04-14 ENCOUNTER — Ambulatory Visit (HOSPITAL_BASED_OUTPATIENT_CLINIC_OR_DEPARTMENT_OTHER): Payer: Medicare Other

## 2017-04-14 VITALS — BP 157/66 | HR 66 | Temp 97.8°F | Resp 18

## 2017-04-14 DIAGNOSIS — D539 Nutritional anemia, unspecified: Secondary | ICD-10-CM

## 2017-04-14 DIAGNOSIS — D696 Thrombocytopenia, unspecified: Secondary | ICD-10-CM

## 2017-04-14 DIAGNOSIS — D693 Immune thrombocytopenic purpura: Secondary | ICD-10-CM

## 2017-04-14 DIAGNOSIS — D46C Myelodysplastic syndrome with isolated del(5q) chromosomal abnormality: Secondary | ICD-10-CM

## 2017-04-14 DIAGNOSIS — D631 Anemia in chronic kidney disease: Secondary | ICD-10-CM

## 2017-04-14 DIAGNOSIS — N183 Chronic kidney disease, stage 3 unspecified: Secondary | ICD-10-CM

## 2017-04-14 LAB — CBC WITH DIFFERENTIAL/PLATELET
BASO%: 0.2 % (ref 0.0–2.0)
Basophils Absolute: 0 10*3/uL (ref 0.0–0.1)
EOS%: 0.8 % (ref 0.0–7.0)
Eosinophils Absolute: 0.1 10*3/uL (ref 0.0–0.5)
HCT: 37 % — ABNORMAL LOW (ref 38.4–49.9)
HEMOGLOBIN: 11 g/dL — AB (ref 13.0–17.1)
LYMPH#: 1.8 10*3/uL (ref 0.9–3.3)
LYMPH%: 27.7 % (ref 14.0–49.0)
MCH: 23.9 pg — AB (ref 27.2–33.4)
MCHC: 29.7 g/dL — ABNORMAL LOW (ref 32.0–36.0)
MCV: 80.4 fL (ref 79.3–98.0)
MONO#: 0.8 10*3/uL (ref 0.1–0.9)
MONO%: 11.8 % (ref 0.0–14.0)
NEUT#: 3.8 10*3/uL (ref 1.5–6.5)
NEUT%: 59.5 % (ref 39.0–75.0)
NRBC: 0 % (ref 0–0)
Platelets: 120 10*3/uL — ABNORMAL LOW (ref 140–400)
RBC: 4.6 10*6/uL (ref 4.20–5.82)
RDW: 24 % — ABNORMAL HIGH (ref 11.0–14.6)
WBC: 6.4 10*3/uL (ref 4.0–10.3)

## 2017-04-14 LAB — TECHNOLOGIST REVIEW

## 2017-04-14 MED ORDER — ROMIPLOSTIM 250 MCG ~~LOC~~ SOLR
250.0000 ug | SUBCUTANEOUS | Status: DC
  Administered 2017-04-14: 250 ug via SUBCUTANEOUS
  Filled 2017-04-14: qty 0.5

## 2017-04-14 NOTE — Patient Instructions (Signed)
Romiplostim injection What is this medicine? ROMIPLOSTIM (roe mi PLOE stim) helps your body make more platelets. This medicine is used to treat low platelets caused by chronic idiopathic thrombocytopenic purpura (ITP). This medicine may be used for other purposes; ask your health care provider or pharmacist if you have questions. COMMON BRAND NAME(S): Nplate What should I tell my health care provider before I take this medicine? They need to know if you have any of these conditions: -cancer or myelodysplastic syndrome -low blood counts, like low white cell, platelet, or red cell counts -take medicines that treat or prevent blood clots -an unusual or allergic reaction to romiplostim, mannitol, other medicines, foods, dyes, or preservatives -pregnant or trying to get pregnant -breast-feeding How should I use this medicine? This medicine is for injection under the skin. It is given by a health care professional in a hospital or clinic setting. A special MedGuide will be given to you before your injection. Read this information carefully each time. Talk to your pediatrician regarding the use of this medicine in children. Special care may be needed. Overdosage: If you think you have taken too much of this medicine contact a poison control center or emergency room at once. NOTE: This medicine is only for you. Do not share this medicine with others. What if I miss a dose? It is important not to miss your dose. Call your doctor or health care professional if you are unable to keep an appointment. What may interact with this medicine? Interactions are not expected. This list may not describe all possible interactions. Give your health care provider a list of all the medicines, herbs, non-prescription drugs, or dietary supplements you use. Also tell them if you smoke, drink alcohol, or use illegal drugs. Some items may interact with your medicine. What should I watch for while using this  medicine? Your condition will be monitored carefully while you are receiving this medicine. Visit your prescriber or health care professional for regular checks on your progress and for the needed blood tests. It is important to keep all appointments. What side effects may I notice from receiving this medicine? Side effects that you should report to your doctor or health care professional as soon as possible: -allergic reactions like skin rash, itching or hives, swelling of the face, lips, or tongue -shortness of breath, chest pain, swelling in a leg -unusual bleeding or bruising Side effects that usually do not require medical attention (report to your doctor or health care professional if they continue or are bothersome): -dizziness -headache -muscle aches -pain in arms and legs -stomach pain -trouble sleeping This list may not describe all possible side effects. Call your doctor for medical advice about side effects. You may report side effects to FDA at 1-800-FDA-1088. Where should I keep my medicine? This drug is given in a hospital or clinic and will not be stored at home. NOTE: This sheet is a summary. It may not cover all possible information. If you have questions about this medicine, talk to your doctor, pharmacist, or health care provider.  2018 Elsevier/Gold Standard (2008-02-05 15:13:04)  

## 2017-04-18 ENCOUNTER — Other Ambulatory Visit: Payer: Self-pay | Admitting: Family Medicine

## 2017-04-18 ENCOUNTER — Encounter: Payer: Self-pay | Admitting: Podiatry

## 2017-04-18 ENCOUNTER — Ambulatory Visit (INDEPENDENT_AMBULATORY_CARE_PROVIDER_SITE_OTHER): Payer: Medicare Other | Admitting: Podiatry

## 2017-04-18 DIAGNOSIS — M79676 Pain in unspecified toe(s): Secondary | ICD-10-CM | POA: Diagnosis not present

## 2017-04-18 DIAGNOSIS — B351 Tinea unguium: Secondary | ICD-10-CM

## 2017-04-18 DIAGNOSIS — D689 Coagulation defect, unspecified: Secondary | ICD-10-CM

## 2017-04-18 DIAGNOSIS — I739 Peripheral vascular disease, unspecified: Secondary | ICD-10-CM

## 2017-04-18 NOTE — Patient Instructions (Signed)

## 2017-04-18 NOTE — Progress Notes (Signed)
Patient ID: Ricardo Watkins, male   DOB: 07-26-1930, 83 y.o.   MRN: [id number]    Subjective: This patient presents for scheduled visit and request debridement of painful toenails which are uncomfortable when walking wearing shoes.The patient's wife is present in the treatment room today  Objective: Orientated 3 Peripheral pitting edema bilaterally DP pulses 0/4 bilaterally PT pulses 1/4 bilaterally Sensation to 10 g monofilament wire intact 5/5 bilaterally Vibratory sensation reactive bilaterally Ankle reflex equal and reactive bilaterally No deformities bilaterally Manual motor testing dorsi flexion, plantar flexion 5/5 bilaterally No open skin lesions bilaterally Atrophic skin with absent hair growth bilaterally The toenails are hypertrophic, elongated, incurvated, discolored and tender direct palpation 6-10 Patient walks slowly with assistance of cane  Assessment: Diabeticwith peripheral arterial disease Symptomatic onychomycoses 6-10 Blood clotting disorder, Plavix  Plan: Debridement toenails 10 and mechanically and electrically without any bleeding  Reappoint 3 months

## 2017-04-21 ENCOUNTER — Ambulatory Visit (HOSPITAL_BASED_OUTPATIENT_CLINIC_OR_DEPARTMENT_OTHER): Payer: Medicare Other

## 2017-04-21 ENCOUNTER — Other Ambulatory Visit (HOSPITAL_BASED_OUTPATIENT_CLINIC_OR_DEPARTMENT_OTHER): Payer: Medicare Other

## 2017-04-21 VITALS — BP 142/73 | HR 74 | Temp 97.2°F | Resp 18

## 2017-04-21 DIAGNOSIS — N183 Chronic kidney disease, stage 3 unspecified: Secondary | ICD-10-CM

## 2017-04-21 DIAGNOSIS — D631 Anemia in chronic kidney disease: Secondary | ICD-10-CM

## 2017-04-21 DIAGNOSIS — D46C Myelodysplastic syndrome with isolated del(5q) chromosomal abnormality: Secondary | ICD-10-CM

## 2017-04-21 DIAGNOSIS — D693 Immune thrombocytopenic purpura: Secondary | ICD-10-CM | POA: Diagnosis present

## 2017-04-21 DIAGNOSIS — D539 Nutritional anemia, unspecified: Secondary | ICD-10-CM

## 2017-04-21 DIAGNOSIS — D696 Thrombocytopenia, unspecified: Secondary | ICD-10-CM

## 2017-04-21 LAB — CBC WITH DIFFERENTIAL/PLATELET
BASO%: 0.2 % (ref 0.0–2.0)
Basophils Absolute: 0 10*3/uL (ref 0.0–0.1)
EOS%: 0.6 % (ref 0.0–7.0)
Eosinophils Absolute: 0 10*3/uL (ref 0.0–0.5)
HCT: 39.3 % (ref 38.4–49.9)
HGB: 11.5 g/dL — ABNORMAL LOW (ref 13.0–17.1)
LYMPH%: 45.6 % (ref 14.0–49.0)
MCH: 23.6 pg — ABNORMAL LOW (ref 27.2–33.4)
MCHC: 29.3 g/dL — AB (ref 32.0–36.0)
MCV: 80.7 fL (ref 79.3–98.0)
MONO#: 0.6 10*3/uL (ref 0.1–0.9)
MONO%: 12.3 % (ref 0.0–14.0)
NEUT%: 41.3 % (ref 39.0–75.0)
NEUTROS ABS: 1.9 10*3/uL (ref 1.5–6.5)
NRBC: 0 % (ref 0–0)
Platelets: 111 10*3/uL — ABNORMAL LOW (ref 140–400)
RBC: 4.87 10*6/uL (ref 4.20–5.82)
RDW: 24.3 % — AB (ref 11.0–14.6)
WBC: 4.6 10*3/uL (ref 4.0–10.3)
lymph#: 2.1 10*3/uL (ref 0.9–3.3)

## 2017-04-21 LAB — TECHNOLOGIST REVIEW

## 2017-04-21 MED ORDER — ROMIPLOSTIM 250 MCG ~~LOC~~ SOLR
250.0000 ug | SUBCUTANEOUS | Status: DC
  Administered 2017-04-21: 250 ug via SUBCUTANEOUS
  Filled 2017-04-21: qty 0.5

## 2017-04-21 NOTE — Patient Instructions (Signed)
Romiplostim injection What is this medicine? ROMIPLOSTIM (roe mi PLOE stim) helps your body make more platelets. This medicine is used to treat low platelets caused by chronic idiopathic thrombocytopenic purpura (ITP). This medicine may be used for other purposes; ask your health care provider or pharmacist if you have questions. COMMON BRAND NAME(S): Nplate What should I tell my health care provider before I take this medicine? They need to know if you have any of these conditions: -cancer or myelodysplastic syndrome -low blood counts, like low white cell, platelet, or red cell counts -take medicines that treat or prevent blood clots -an unusual or allergic reaction to romiplostim, mannitol, other medicines, foods, dyes, or preservatives -pregnant or trying to get pregnant -breast-feeding How should I use this medicine? This medicine is for injection under the skin. It is given by a health care professional in a hospital or clinic setting. A special MedGuide will be given to you before your injection. Read this information carefully each time. Talk to your pediatrician regarding the use of this medicine in children. Special care may be needed. Overdosage: If you think you have taken too much of this medicine contact a poison control center or emergency room at once. NOTE: This medicine is only for you. Do not share this medicine with others. What if I miss a dose? It is important not to miss your dose. Call your doctor or health care professional if you are unable to keep an appointment. What may interact with this medicine? Interactions are not expected. This list may not describe all possible interactions. Give your health care provider a list of all the medicines, herbs, non-prescription drugs, or dietary supplements you use. Also tell them if you smoke, drink alcohol, or use illegal drugs. Some items may interact with your medicine. What should I watch for while using this  medicine? Your condition will be monitored carefully while you are receiving this medicine. Visit your prescriber or health care professional for regular checks on your progress and for the needed blood tests. It is important to keep all appointments. What side effects may I notice from receiving this medicine? Side effects that you should report to your doctor or health care professional as soon as possible: -allergic reactions like skin rash, itching or hives, swelling of the face, lips, or tongue -shortness of breath, chest pain, swelling in a leg -unusual bleeding or bruising Side effects that usually do not require medical attention (report to your doctor or health care professional if they continue or are bothersome): -dizziness -headache -muscle aches -pain in arms and legs -stomach pain -trouble sleeping This list may not describe all possible side effects. Call your doctor for medical advice about side effects. You may report side effects to FDA at 1-800-FDA-1088. Where should I keep my medicine? This drug is given in a hospital or clinic and will not be stored at home. NOTE: This sheet is a summary. It may not cover all possible information. If you have questions about this medicine, talk to your doctor, pharmacist, or health care provider.  2018 Elsevier/Gold Standard (2008-02-05 15:13:04)  

## 2017-04-22 ENCOUNTER — Encounter (HOSPITAL_COMMUNITY): Payer: Self-pay | Admitting: Emergency Medicine

## 2017-04-22 ENCOUNTER — Ambulatory Visit (INDEPENDENT_AMBULATORY_CARE_PROVIDER_SITE_OTHER): Payer: Medicare Other

## 2017-04-22 ENCOUNTER — Ambulatory Visit (HOSPITAL_COMMUNITY)
Admission: EM | Admit: 2017-04-22 | Discharge: 2017-04-22 | Disposition: A | Discharge status: Home / Self Care | Payer: Medicare Other | Attending: Internal Medicine | Admitting: Internal Medicine

## 2017-04-22 DIAGNOSIS — J189 Pneumonia, unspecified organism: Secondary | ICD-10-CM

## 2017-04-22 DIAGNOSIS — I517 Cardiomegaly: Secondary | ICD-10-CM | POA: Diagnosis not present

## 2017-04-22 MED ORDER — ACETAMINOPHEN 325 MG PO TABS
650.0000 mg | ORAL_TABLET | Freq: Once | ORAL | Status: AC
  Administered 2017-04-22: 650 mg via ORAL

## 2017-04-22 MED ORDER — ACETAMINOPHEN 325 MG PO TABS
ORAL_TABLET | ORAL | Status: AC
  Filled 2017-04-22: qty 2

## 2017-04-22 MED ORDER — LEVOFLOXACIN 750 MG PO TABS: 750.0000 mg | ORAL_TABLET | Freq: Every day | ORAL | 0 refills | Status: AC

## 2017-04-22 NOTE — Discharge Instructions (Signed)
Continue with tylenol as needed for fevers. Ensure adequate hydration, small frequent sips of fluids. If develop worsening of symptoms, chest pain, shortness of breath, fevers, weakness, confusion please go to the ED. Please go to your primary care provider on Monday for a recheck.

## 2017-04-22 NOTE — ED Triage Notes (Signed)
Pt c/o URI all week, coughing really bad, chest congestion. Nasal congestion. HA.

## 2017-04-22 NOTE — ED Provider Notes (Signed)
Ricardo Watkins    CSN: [id number] Arrival date & time: 04/22/17  1002     History   Chief Complaint Chief Complaint  Patient presents with  . Cough  . URI    HPI Ricardo Watkins is a 83 y.o. male.   Ricardo Watkins presents with his wife and daughter with complaints of worsening cough. He has had URI symptoms since approximately 10/28 but feels like symptoms are worsening. Cough is congested, family states they hear a "rattling." he has been achy, with headaches, runny nose, sweating and presumably fevers, decreased appetite  And 1 episode of vomiting last night. He has been urinating. He has had ill family members as well who have improved. He has not been recently hospitalized and has not been on antibiotics recently. Denies sore throat or ear pain,denies chest pain abdominal pain or shortness of breath. Did get his flu shot this season. Has not taken tylenol since last night. Extensive medical history including history of lymphoma, diabetes, blood clot, htn, cad, renal insufficiency, chf, stroke. Denies any new weakness or confusion, confirmed by family.   ROS per HPI.       Past Medical History:  Diagnosis Date  . Adenomatous colon polyp   . CAD (coronary artery disease)    30% LAD Stenosis, 70% ramus intermedius stenosis, treated with PTCA and angioplasty by Dr Ricardo Watkins 2004  . Cataract    right eye  . Cough, persistent 11/04/2015  . Deficiency anemia 04/19/2014  . Diverticulosis   . DM (diabetes mellitus) ([place])   . DVT (deep venous thrombosis) ([place])    secondary to surgery  . Dyslipidemia   . Hyperlipidemia   . Hypertension   . Inguinal hernia    right  . 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 Microcytic anemia 07/07/2015  . 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 Non Hodgkin's lymphoma ([place])   . Pleural effusion, left 11/04/2015  . Prostate CA ([place]) 09/10/2011   Gleason 3+3 R, 3+4 L lobe May 2007 Rx Radioactive seed implants Dr Ricardo Watkins  . PVD (peripheral vascular disease) ([place])    rt renal artery stent  . Renal insufficiency   . Thrombocytopenia ([place])   . 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 Vitamin D deficiency     Patient Active Problem List   Diagnosis Date Noted  . CKD (chronic kidney disease), stage IV ([place]) 12/16/2016  . Bilateral carotid artery stenosis 12/11/2016  . Dyslipidemia 12/11/2016  . Anemia in chronic kidney disease 07/01/2016  . Chronic ITP (idiopathic thrombocytopenia) ([facility]) 07/01/2016  . B12 deficiency anemia   . Chest tube in place   . Acute diastolic CHF (congestive heart failure) ([place]) 04/16/2016  . Acute respiratory failure with hypoxia ([place]) 04/14/2016  . Empyema lung ([place])   . Type 2 diabetes mellitus ([place]) 04/04/2016  . Empyema ([place]) 04/03/2016  . Thyroid nodule 03/03/2016  . Sepsis ([place]) 02/17/2016  . Dyspnea 01/27/2016  . Pulmonary edema with congestive heart failure ([place]) 11/12/2015  . Cough, persistent 11/04/2015  . Pleural effusion 11/04/2015  . Anorexia 08/08/2015  . Microcytic anemia 07/07/2015  . Pancytopenia ([place]) 04/08/2015  . Diarrhea 04/08/2015  . Encounter for chemotherapy management 04/02/2015  . Neutropenia ([place]) 02/18/2015  . 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 Deficiency anemia 04/19/2014  .  Thrombocytopenia ([place]) 04/19/2014  . Vitamin B12 deficiency 04/19/2014  . Chronic renal failure, stage 3 (moderate) ([place]) 04/19/2014  . Macrocytic anemia 03/20/2013  . Monocytosis 03/20/2013  . Thrombotic stroke ([place]) 09/10/2011  . Prostate CA ([place]) 09/10/2011  . Inguinal hernia   . Colon polyps   . CAD (coronary artery disease) 08/26/2010  . HTN (hypertension) 08/26/2010  . Murmur 08/26/2010  . MURMUR 08/26/2010  . History of lymphoma 08/25/2010  . DM 08/25/2010  . DYSLIPIDEMIA 08/25/2010  .  THROMBOCYTOPENIA 08/25/2010  . Essential hypertension 08/25/2010  . Coronary atherosclerosis 08/25/2010  . PVD 08/25/2010    Past Surgical History:  Procedure Laterality Date  . APPENDECTOMY     patient ?  Marland Kitchen CARDIAC CATHETERIZATION    . CHEST TUBE INSERTION Left 02/10/2016   Procedure: INSERTION PLEURAL DRAINAGE CATHETER;  Surgeon: Ricardo Poot, MD;  Location: [facility];  Service: Thoracic;  Laterality: Left;  . CHEST TUBE INSERTION Left 04/08/2016   Procedure: CHEST TUBE INSERTION;  Surgeon: Ricardo Poot, MD;  Location: [facility];  Service: Thoracic;  Laterality: Left;  . COLONOSCOPY    . CORONARY ANGIOPLASTY    . CYSTOURETHROSCOPY     ROBOTIC ARM NUCLETRON SEED IMPLANTATION OF PROSTATE  . EXPLORATORY LAPAROTOMY     For evaluation of lymphoma  . REMOVAL OF PLEURAL DRAINAGE CATHETER Left 04/08/2016   Procedure: REMOVAL OF PLEURAL DRAINAGE CATHETER;  Surgeon: Ricardo Poot, MD;  Location: [facility];  Service: Thoracic;  Laterality: Left;       Home Medications    Prior to Admission medications   Medication Sig Start Date End Date Taking? Authorizing Provider  acetaminophen (TYLENOL) 500 MG tablet Take 1,000 mg by mouth every 6 (six) hours as needed for mild pain, moderate pain or headache.    Yes [provider]  clopidogrel (PLAVIX) 75 MG tablet TAKE 1 TABLET BY MOUTH ONCE A DAY WITH BREAKFAST 04/18/17  Yes [name], MD  cyanocobalamin (,VITAMIN B-12,) 1000 MCG/ML injection Inject 1 mL (1,000 mcg total) into the muscle every 30 (thirty) days. 06/29/16  Yes [name], MD  Dextromethorphan Polistirex (DELSYM PO) Take 15 mLs by mouth 3 times/day as needed-between meals & bedtime (cough).    Yes [provider]  furosemide (LASIX) 40 MG tablet TAKE 1 TABLET BY MOUTH ONCE A DAY 11/01/16  Yes [name], MD  glucose blood test strip Check BS TID 12/06/16  Yes [name], MD  hydrocortisone cream 1 % Apply 1 application topically at bedtime as  needed for itching.   Yes [provider]  Insulin Glargine (LANTUS SOLOSTAR) 100 UNIT/ML Solostar Pen INJECT 25 UNITS EVERY NIGHT AT BEDTIME Patient taking differently: 15 Units. INJECT 25 UNITS EVERY NIGHT AT BEDTIME 06/09/16  Yes [name], MD  meclizine (ANTIVERT) 12.5 MG tablet Take 1 tablet (12.5 mg total) by mouth 3 (three) times daily as needed for dizziness. 06/27/15  Yes [name], MD  metoprolol succinate (TOPROL-XL) 100 MG 24 hr tablet TAKE 1 TABLET BY MOUTH ONCE A DAY 01/10/17  Yes [name], MD  Multiple Vitamin (MULTIVITAMIN WITH MINERALS) TABS tablet Take 1 tablet by mouth at bedtime.   Yes [provider]  OVER THE COUNTER MEDICATION Place 1 drop into both eyes daily as needed (dry eyes). Over the counter lubricating eye drop   Yes [provider]  SYRINGE-NEEDLE, DISP, 3 ML (LUER LOCK SAFETY SYRINGES) 25G X 1" 3 ML MISC Use  with B 12 injections q month 06/29/16  Yes [name], MD  tamsulosin (FLOMAX) 0.4 MG CAPS capsule TAKE 1 CAPSULE BY MOUTH ONCE DAILY 07/14/16  Yes [name], MD  levofloxacin (LEVAQUIN) 750 MG tablet Take 1 tablet (750 mg total) by mouth daily. 04/22/17 04/27/17  Zigmund Gottron, NP    Family History Family History  Problem Relation Age of Onset  . Lymphoma Sister   . Prostate cancer Brother   . Breast cancer Sister   . Diabetes Brother   . Diabetes Sister   . Heart disease Brother   . Asthma Son     Social History Social History  Substance Use Topics  . Smoking status: Former Smoker    Packs/day: 0.50    Years: 20.00    Types: Pipe, Cigarettes    Quit date: 06/21/1958  . Smokeless tobacco: Never Used  . Alcohol use No     Allergies   Glucophage [metformin hydrochloride]; Zetia [ezetimibe]; Fenofibrate; and Niacin-lovastatin er   Review of Systems Review of Systems   Physical Exam Triage Vital Signs ED Triage Vitals  Enc Vitals Group     BP 04/22/17 1014 (!) 162/67      Pulse Rate 04/22/17 1014 65     Resp 04/22/17 1014 20     Temp 04/22/17 1014 (!) 101 F (38.3 C)     Temp Source 04/22/17 1014 Oral     SpO2 04/22/17 1014 96 %     Weight --      Height --      Head Circumference --      Peak Flow --      Pain Score 04/22/17 1016 8     Pain Loc --      Pain Edu? --      Excl. in [place]? --    No data found.   Updated Vital Signs BP (!) 162/67   Pulse 65   Temp (!) 101 F (38.3 C) (Oral)   Resp 20   SpO2 96%   Visual Acuity Right Eye Distance:   Left Eye Distance:   Bilateral Distance:    Right Eye Near:   Left Eye Near:    Bilateral Near:     Physical Exam  Constitutional: He appears well-developed and well-nourished. No distress.  HENT:  Head: Normocephalic and atraumatic.  Right Ear: Tympanic membrane, external ear and ear canal normal.  Left Ear: Tympanic membrane, external ear and ear canal normal.  Nose: Nose normal. Right sinus exhibits no maxillary sinus tenderness and no frontal sinus tenderness. Left sinus exhibits no maxillary sinus tenderness and no frontal sinus tenderness.  Mouth/Throat: Uvula is midline, oropharynx is clear and moist and mucous membranes are normal. No tonsillar exudate.  Eyes: Pupils are equal, round, and reactive to light. EOM are normal.  Neck: Normal range of motion.  Cardiovascular: Normal rate.   Murmur heard. Pulmonary/Chest: No accessory muscle usage. No tachypnea. No respiratory distress. He has decreased breath sounds.  Congested cough noted  Abdominal: Soft.  Neurological: He is alert.  Skin: Skin is warm and dry. No rash noted.  Vitals reviewed.    UC Treatments / Results  Labs (all labs ordered are listed, but only abnormal results are displayed) Labs Reviewed - No data to display  EKG  EKG Interpretation None       Radiology Dg Chest 2 View  Result Date: 04/22/2017 CLINICAL DATA:  Upper respiratory tract infection. EXAM: CHEST  2 VIEW COMPARISON:  06/16/2016 . FINDINGS:  Mild cardiomegaly. Mild bilateral chronic interstitial prominence noted. Mild pneumonitis cannot be excluded. Mild right base infiltrate consistent with mild pneumonia noted. No pleural effusion or pneumothorax. IMPRESSION: 1. Mild bilateral chronic interstitial again noted. Mild superimposed pneumonitis cannot be excluded. Mild right base infiltrate consistent mild pneumonia noted. 2. Mild cardiomegaly.  No pulmonary venous congestion. Electronically Signed   By: Marcello Moores  Register   On: 04/22/2017 10:52    Procedures Procedures (including critical care time)  Medications Ordered in UC Medications  acetaminophen (TYLENOL) tablet 650 mg (650 mg Oral Given 04/22/17 1021)     Initial Impression / Assessment and Plan / UC Course  I have reviewed the triage vital signs and the nursing notes.  Pertinent labs & imaging results that were available during my care of the patient were reviewed by me and considered in my medical decision making (see chart for details).     Without confusion, BP stable today. Without tachypnea or shortness of breath. Discussed with family will try outpatient treatment for pneumonia with very close monitoring. He has an appointment with his PCP on Monday already scheduled. Discussed signs to return to ED at length, patient and family verbalized understanding. Tylenol as needed for pain and fevers. Maintain adequate hydration. Patient verbalized understanding and agreeable to plan.    Final Clinical Impressions(s) / UC Diagnoses   Final diagnoses:  Community acquired pneumonia of right lung, unspecified part of lung    New Prescriptions New Prescriptions   LEVOFLOXACIN (LEVAQUIN) 750 MG TABLET    Take 1 tablet (750 mg total) by mouth daily.     Controlled Substance Prescriptions [place] Controlled Substance Registry consulted? Not Applicable   Zigmund Gottron, NP 04/22/17 1113

## 2017-04-25 ENCOUNTER — Encounter: Payer: Self-pay | Admitting: Family Medicine

## 2017-04-25 ENCOUNTER — Ambulatory Visit (INDEPENDENT_AMBULATORY_CARE_PROVIDER_SITE_OTHER): Payer: Medicare Other | Admitting: Family Medicine

## 2017-04-25 VITALS — BP 110/62 | HR 64 | Temp 98.2°F | Resp 18 | Ht 69.0 in | Wt 189.0 lb

## 2017-04-25 DIAGNOSIS — J181 Lobar pneumonia, unspecified organism: Secondary | ICD-10-CM

## 2017-04-25 DIAGNOSIS — J189 Pneumonia, unspecified organism: Secondary | ICD-10-CM

## 2017-04-25 DIAGNOSIS — I6523 Occlusion and stenosis of bilateral carotid arteries: Secondary | ICD-10-CM

## 2017-04-25 MED ORDER — LEVOFLOXACIN 250 MG PO TABS: 250.0000 mg | ORAL_TABLET | Freq: Every day | ORAL | 0 refills | Status: DC

## 2017-04-25 NOTE — Progress Notes (Signed)
Subjective:    Patient ID: Ricardo Watkins, male    DOB: Feb 15, 1931, 83 y.o.   MRN: [id number]  HPI Was seen Friday in the ER and CXR revealed mild RLL infiltrate. Was diagnosed with CAP and started on levaquin 750 mg poqd to treat.  He does have CKD with last creatinine 1.72 in 11/2016.  Patient symptoms began 1 week prior to his ER visit.  He was having cough, chest congestion, fever, worsening shortness of breath.  Symptoms have improved slightly since starting the antibiotic.  He has completed all but 1 dose.  Clinically he is by no means resolved.  He still has subjective fevers, cough, and right basilar crackles on exam.  He also has a lesion on his left temple that appears to be skin cancer that he states has been there for 3-4 weeks.  It is an ulcerative patch approximately the size of a quarter just in front of his left ear in his sideburn. Past Medical History:  Diagnosis Date  . Adenomatous colon polyp   . CAD (coronary artery disease)    30% LAD Stenosis, 70% ramus intermedius stenosis, treated with PTCA and angioplasty by Dr Albertine Patricia 2004  . Cataract    right eye  . Cough, persistent 11/04/2015  . Deficiency anemia 04/19/2014  . Diverticulosis   . DM (diabetes mellitus) ([place])   . DVT (deep venous thrombosis) ([place])    secondary to surgery  . Dyslipidemia   . Hyperlipidemia   . Hypertension   . Inguinal hernia    right  . 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 Microcytic anemia 07/07/2015  . 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 Non Hodgkin's lymphoma ([place])   . Pleural effusion, left 11/04/2015  . 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 PVD (peripheral vascular disease) ([place])    rt renal artery stent  . Renal insufficiency   . Thrombocytopenia ([place])   . Thrombotic stroke ([place]) 09/10/2011   January 18, 2011 infarct genu & post limb R internal capsule - acute; previous lacunar  infarcts/extensive white matter dis  . Vitamin D deficiency    Past Surgical History:  Procedure Laterality Date  . APPENDECTOMY     patient ?  Marland Kitchen CARDIAC CATHETERIZATION    . COLONOSCOPY    . CORONARY ANGIOPLASTY    . CYSTOURETHROSCOPY     ROBOTIC ARM NUCLETRON SEED IMPLANTATION OF PROSTATE  . EXPLORATORY LAPAROTOMY     For evaluation of lymphoma   Current Outpatient Medications on File Prior to Visit  Medication Sig Dispense Refill  . acetaminophen (TYLENOL) 500 MG tablet Take 1,000 mg by mouth every 6 (six) hours as needed for mild pain, moderate pain or headache.     . clopidogrel (PLAVIX) 75 MG tablet TAKE 1 TABLET BY MOUTH ONCE A DAY WITH BREAKFAST 90 tablet 3  . cyanocobalamin (,VITAMIN B-12,) 1000 MCG/ML injection Inject 1 mL (1,000 mcg total) into the muscle every 30 (thirty) days. 10 mL 3  . Dextromethorphan Polistirex (DELSYM PO) Take 15 mLs by mouth 3 times/day as needed-between meals & bedtime (cough).     . furosemide (LASIX) 40 MG tablet TAKE 1 TABLET BY MOUTH ONCE A DAY 90 tablet 3  . glucose blood test strip Check BS TID 300 each 3  . hydrocortisone cream 1 % Apply 1 application topically at bedtime  as needed for itching.    . Insulin Glargine (LANTUS SOLOSTAR) 100 UNIT/ML Solostar Pen INJECT 25 UNITS EVERY NIGHT AT BEDTIME (Patient taking differently: 15 Units. INJECT 25 UNITS EVERY NIGHT AT BEDTIME) 5 pen 3  . levofloxacin (LEVAQUIN) 750 MG tablet Take 1 tablet (750 mg total) by mouth daily. 5 tablet 0  . meclizine (ANTIVERT) 12.5 MG tablet Take 1 tablet (12.5 mg total) by mouth 3 (three) times daily as needed for dizziness. 30 tablet 0  . metoprolol succinate (TOPROL-XL) 100 MG 24 hr tablet TAKE 1 TABLET BY MOUTH ONCE A DAY 90 tablet 3  . Multiple Vitamin (MULTIVITAMIN WITH MINERALS) TABS tablet Take 1 tablet by mouth at bedtime.    Marland Kitchen OVER THE COUNTER MEDICATION Place 1 drop into both eyes daily as needed (dry eyes). Over the counter lubricating eye drop    .  SYRINGE-NEEDLE, DISP, 3 ML (LUER LOCK SAFETY SYRINGES) 25G X 1" 3 ML MISC Use with B 12 injections q month 100 each 3  . tamsulosin (FLOMAX) 0.4 MG CAPS capsule TAKE 1 CAPSULE BY MOUTH ONCE DAILY 90 capsule 3   Current Facility-Administered Medications on File Prior to Visit  Medication Dose Route Frequency Provider Last Rate Last Dose  . cyanocobalamin ((VITAMIN B-12)) injection 1,000 mcg  1,000 mcg Subcutaneous Q30 days [name], MD   1,000 mcg at 07/06/16 1430  . romiPLOStim (NPLATE) injection 170 mcg  170 mcg Subcutaneous Weekly [name], Ni, MD   170 mcg at 11/25/16 1345   Allergies  Allergen Reactions  . Glucophage [Metformin Hydrochloride] Other (See Comments)    Chest pain  . Zetia [Ezetimibe] Other (See Comments)    weakness  . Fenofibrate Rash  . Niacin-Lovastatin Er Rash   Social History   Socioeconomic History  . Marital status: Married    Spouse name: Not on file  . Number of children: 4  . Years of education: Not on file  . Highest education level: Not on file  Social Needs  . Financial resource strain: Not on file  . Food insecurity - worry: Not on file  . Food insecurity - inability: Not on file  . Transportation needs - medical: Not on file  . Transportation needs - non-medical: Not on file  Occupational History  . Occupation: retired  Tobacco Use  . Smoking status: Former Smoker    Packs/day: 0.50    Years: 20.00    Pack years: 10.00    Types: Pipe, Cigarettes    Last attempt to quit: 06/21/1958    Years since quitting: 58.8  . Smokeless tobacco: Never Used  Substance and Sexual Activity  . Alcohol use: No    Alcohol/week: 0.0 oz  . Drug use: No  . Sexual activity: Yes    Partners: Female  Other Topics Concern  . Not on file  Social History Narrative  . Not on file      Review of Systems  All other systems reviewed and are negative.      Objective:   Physical Exam  Constitutional: He appears well-developed and well-nourished. No  distress.  HENT:  Head: Normocephalic and atraumatic.  Right Ear: External ear normal.  Left Ear: External ear normal.  Nose: Nose normal.  Mouth/Throat: Oropharynx is clear and moist. No oropharyngeal exudate.  Eyes: Conjunctivae and EOM are normal. Pupils are equal, round, and reactive to light. Right eye exhibits no discharge. Left eye exhibits no discharge.  Neck: Neck supple.  Cardiovascular: Normal rate, regular rhythm  and normal heart sounds.  No murmur heard. Pulmonary/Chest: Effort normal. No respiratory distress. He has no wheezes. He has rales. He exhibits no tenderness.  Abdominal: Soft. Bowel sounds are normal. He exhibits no distension and no mass. There is no tenderness. There is no rebound and no guarding.  Lymphadenopathy:    He has no cervical adenopathy.  Skin: Rash noted. He is not diaphoretic.  Vitals reviewed.         Assessment & Plan:  Community acquired pneumonia of right lower lobe of lung ([place])  I believe the patient needs a longer duration of antibiotics given his age, compromised immune system, and persistent symptoms although clinically he is improving.  I recommended taking Levaquin 250 mg p.o. daily because of his renal insufficiency for an additional 5 days and recheck next week.  If clinically improved at that point, I would recommend a biopsy of the lesion on the left side of his face which has characteristics concerning for skin cancer

## 2017-04-26 LAB — CBC WITH DIFFERENTIAL/PLATELET
BASOS ABS: 11 {cells}/uL (ref 0–200)
BASOS PCT: 0.3 %
EOS ABS: 0 {cells}/uL — AB (ref 15–500)
Eosinophils Relative: 0 %
HEMATOCRIT: 35.4 % — AB (ref 38.5–50.0)
Hemoglobin: 10.7 g/dL — ABNORMAL LOW (ref 13.2–17.1)
Lymphs Abs: 1724 cells/uL (ref 850–3900)
MCH: 23.6 pg — AB (ref 27.0–33.0)
MCHC: 30.2 g/dL — AB (ref 32.0–36.0)
MCV: 78 fL — AB (ref 80.0–100.0)
Monocytes Relative: 8.7 %
NEUTROS PCT: 43.1 %
Neutro Abs: 1552 cells/uL (ref 1500–7800)
Platelets: 100 10*3/uL — ABNORMAL LOW (ref 140–400)
RBC: 4.54 10*6/uL (ref 4.20–5.80)
RDW: 21.9 % — AB (ref 11.0–15.0)
TOTAL LYMPHOCYTE: 47.9 %
WBC mixed population: 313 cells/uL (ref 200–950)
WBC: 3.6 10*3/uL — ABNORMAL LOW (ref 3.8–10.8)

## 2017-04-26 LAB — BASIC METABOLIC PANEL WITH GFR
BUN / CREAT RATIO: 15 (calc) (ref 6–22)
BUN: 35 mg/dL — ABNORMAL HIGH (ref 7–25)
CALCIUM: 8.7 mg/dL (ref 8.6–10.3)
CO2: 25 mmol/L (ref 20–32)
Chloride: 102 mmol/L (ref 98–110)
Creat: 2.36 mg/dL — ABNORMAL HIGH (ref 0.70–1.11)
GFR, EST AFRICAN AMERICAN: 28 mL/min/{1.73_m2} — AB (ref >=60)
GFR, EST NON AFRICAN AMERICAN: 24 mL/min/{1.73_m2} — AB (ref >=60)
Glucose, Bld: 400 mg/dL — ABNORMAL HIGH (ref 65–99)
POTASSIUM: 4.6 mmol/L (ref 3.5–5.3)
Sodium: 136 mmol/L (ref 135–146)

## 2017-04-28 ENCOUNTER — Other Ambulatory Visit (HOSPITAL_BASED_OUTPATIENT_CLINIC_OR_DEPARTMENT_OTHER): Payer: Medicare Other

## 2017-04-28 ENCOUNTER — Ambulatory Visit (HOSPITAL_BASED_OUTPATIENT_CLINIC_OR_DEPARTMENT_OTHER): Payer: Medicare Other

## 2017-04-28 VITALS — BP 146/64 | HR 77 | Temp 97.5°F | Resp 20

## 2017-04-28 DIAGNOSIS — D46C Myelodysplastic syndrome with isolated del(5q) chromosomal abnormality: Secondary | ICD-10-CM

## 2017-04-28 DIAGNOSIS — D693 Immune thrombocytopenic purpura: Secondary | ICD-10-CM | POA: Diagnosis present

## 2017-04-28 DIAGNOSIS — D696 Thrombocytopenia, unspecified: Secondary | ICD-10-CM

## 2017-04-28 DIAGNOSIS — N183 Chronic kidney disease, stage 3 unspecified: Secondary | ICD-10-CM

## 2017-04-28 DIAGNOSIS — D631 Anemia in chronic kidney disease: Secondary | ICD-10-CM

## 2017-04-28 DIAGNOSIS — D539 Nutritional anemia, unspecified: Secondary | ICD-10-CM

## 2017-04-28 LAB — CBC WITH DIFFERENTIAL/PLATELET
BASO%: 0.3 % (ref 0.0–2.0)
BASOS ABS: 0 10*3/uL (ref 0.0–0.1)
EOS%: 0.6 % (ref 0.0–7.0)
Eosinophils Absolute: 0 10*3/uL (ref 0.0–0.5)
HEMATOCRIT: 35.9 % — AB (ref 38.4–49.9)
HEMOGLOBIN: 10.6 g/dL — AB (ref 13.0–17.1)
LYMPH#: 1.7 10*3/uL (ref 0.9–3.3)
LYMPH%: 54.3 % — AB (ref 14.0–49.0)
MCH: 23.6 pg — AB (ref 27.2–33.4)
MCHC: 29.5 g/dL — AB (ref 32.0–36.0)
MCV: 80 fL (ref 79.3–98.0)
MONO#: 0.4 10*3/uL (ref 0.1–0.9)
MONO%: 11 % (ref 0.0–14.0)
NEUT#: 1.1 10*3/uL — ABNORMAL LOW (ref 1.5–6.5)
NEUT%: 33.8 % — AB (ref 39.0–75.0)
PLATELETS: 129 10*3/uL — AB (ref 140–400)
RBC: 4.49 10*6/uL (ref 4.20–5.82)
RDW: 24.3 % — ABNORMAL HIGH (ref 11.0–14.6)
WBC: 3.2 10*3/uL — ABNORMAL LOW (ref 4.0–10.3)
nRBC: 0 % (ref 0–0)

## 2017-04-28 LAB — TECHNOLOGIST REVIEW

## 2017-04-28 MED ORDER — ROMIPLOSTIM 250 MCG ~~LOC~~ SOLR
250.0000 ug | SUBCUTANEOUS | Status: DC
  Administered 2017-04-28: 250 ug via SUBCUTANEOUS
  Filled 2017-04-28: qty 0.5

## 2017-04-28 NOTE — Patient Instructions (Signed)
Romiplostim injection What is this medicine? ROMIPLOSTIM (roe mi PLOE stim) helps your body make more platelets. This medicine is used to treat low platelets caused by chronic idiopathic thrombocytopenic purpura (ITP). This medicine may be used for other purposes; ask your health care provider or pharmacist if you have questions. COMMON BRAND NAME(S): Nplate What should I tell my health care provider before I take this medicine? They need to know if you have any of these conditions: -cancer or myelodysplastic syndrome -low blood counts, like low white cell, platelet, or red cell counts -take medicines that treat or prevent blood clots -an unusual or allergic reaction to romiplostim, mannitol, other medicines, foods, dyes, or preservatives -pregnant or trying to get pregnant -breast-feeding How should I use this medicine? This medicine is for injection under the skin. It is given by a health care professional in a hospital or clinic setting. A special MedGuide will be given to you before your injection. Read this information carefully each time. Talk to your pediatrician regarding the use of this medicine in children. Special care may be needed. Overdosage: If you think you have taken too much of this medicine contact a poison control center or emergency room at once. NOTE: This medicine is only for you. Do not share this medicine with others. What if I miss a dose? It is important not to miss your dose. Call your doctor or health care professional if you are unable to keep an appointment. What may interact with this medicine? Interactions are not expected. This list may not describe all possible interactions. Give your health care provider a list of all the medicines, herbs, non-prescription drugs, or dietary supplements you use. Also tell them if you smoke, drink alcohol, or use illegal drugs. Some items may interact with your medicine. What should I watch for while using this  medicine? Your condition will be monitored carefully while you are receiving this medicine. Visit your prescriber or health care professional for regular checks on your progress and for the needed blood tests. It is important to keep all appointments. What side effects may I notice from receiving this medicine? Side effects that you should report to your doctor or health care professional as soon as possible: -allergic reactions like skin rash, itching or hives, swelling of the face, lips, or tongue -shortness of breath, chest pain, swelling in a leg -unusual bleeding or bruising Side effects that usually do not require medical attention (report to your doctor or health care professional if they continue or are bothersome): -dizziness -headache -muscle aches -pain in arms and legs -stomach pain -trouble sleeping This list may not describe all possible side effects. Call your doctor for medical advice about side effects. You may report side effects to FDA at 1-800-FDA-1088. Where should I keep my medicine? This drug is given in a hospital or clinic and will not be stored at home. NOTE: This sheet is a summary. It may not cover all possible information. If you have questions about this medicine, talk to your doctor, pharmacist, or health care provider.  2018 Elsevier/Gold Standard (2008-02-05 15:13:04)  

## 2017-05-02 ENCOUNTER — Encounter: Payer: Self-pay | Admitting: Family Medicine

## 2017-05-02 ENCOUNTER — Ambulatory Visit (INDEPENDENT_AMBULATORY_CARE_PROVIDER_SITE_OTHER): Payer: Medicare Other | Admitting: Family Medicine

## 2017-05-02 VITALS — BP 118/60 | HR 76 | Temp 97.4°F | Resp 16 | Ht 69.0 in | Wt 189.0 lb

## 2017-05-02 DIAGNOSIS — I6523 Occlusion and stenosis of bilateral carotid arteries: Secondary | ICD-10-CM | POA: Diagnosis not present

## 2017-05-02 DIAGNOSIS — D485 Neoplasm of uncertain behavior of skin: Secondary | ICD-10-CM | POA: Diagnosis not present

## 2017-05-02 DIAGNOSIS — N184 Chronic kidney disease, stage 4 (severe): Secondary | ICD-10-CM | POA: Diagnosis not present

## 2017-05-02 DIAGNOSIS — C44329 Squamous cell carcinoma of skin of other parts of face: Secondary | ICD-10-CM | POA: Diagnosis not present

## 2017-05-02 MED ORDER — TRAMADOL HCL 50 MG PO TABS: 50.0000 mg | ORAL_TABLET | Freq: Three times a day (TID) | ORAL | 0 refills | Status: DC | PRN

## 2017-05-02 NOTE — Progress Notes (Signed)
Subjective:    Patient ID: Ricardo Watkins, male    DOB: Apr 22, 1931, 83 y.o.   MRN: [id number]  HPI  04/25/17  Was seen Friday in the ER and CXR revealed mild RLL infiltrate. Was diagnosed with CAP and started on levaquin 750 mg poqd to treat.  He does have CKD with last creatinine 1.72 in 11/2016.  Patient symptoms began 1 week prior to his ER visit.  He was having cough, chest congestion, fever, worsening shortness of breath.  Symptoms have improved slightly since starting the antibiotic.  He has completed all but 1 dose.  Clinically he is by no means resolved.  He still has subjective fevers, cough, and right basilar crackles on exam.  He also has a lesion on his left temple that appears to be skin cancer that he states has been there for 3-4 weeks.  It is an ulcerative patch approximately the size of a quarter just in front of his left ear in his sideburn.  At that time, my plan ws: I believe the patient needs a longer duration of antibiotics given his age, compromised immune system, and persistent symptoms although clinically he is improving.  I recommended taking Levaquin 250 mg p.o. daily because of his renal insufficiency for an additional 5 days and recheck next week.  If clinically improved at that point, I would recommend a biopsy of the lesion on the left side of his face which has characteristics concerning for skin cancer  05/02/17 Patient's cough is doing much better.  On exam today, his lungs are much clear.  The crackles and rales that were present previously have subsided.  He denies any fevers or chest pain.  However his renal function tests were slightly elevated beyond his baseline when I last checked them.  I would like to recheck this today now that the patient has recovered from his pneumonia and is off antibiotics.  He also wants to biopsy the lesion on his left temple.  It is located just behind his left eye and just anterior to his left ear.  There are 2 patches inside his  sideburn.  One is a large impetigo-like patch about the size of a nickel in his sideburn.  The other is a 1.1 cm pink scaly erythematous nodule just anterior to that.  There is also a large erythematous patch just below that in the sideburn.  All of these are almost coalescent into one area and are concerning for malignancy.  He also complains of pain in both shoulders worse with abduction and worse when he sleeps at night. Past Medical History:  Diagnosis Date  . Adenomatous colon polyp   . CAD (coronary artery disease)    30% LAD Stenosis, 70% ramus intermedius stenosis, treated with PTCA and angioplasty by Dr Albertine Patricia 2004  . Cataract    right eye  . Cough, persistent 11/04/2015  . Deficiency anemia 04/19/2014  . Diverticulosis   . DM (diabetes mellitus) ([place])   . DVT (deep venous thrombosis) ([place])    secondary to surgery  . Dyslipidemia   . Hyperlipidemia   . Hypertension   . Inguinal hernia    right  . 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 Microcytic anemia 07/07/2015  . 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 Non Hodgkin's lymphoma ([place])   . Pleural effusion, left 11/04/2015  . 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 PVD (peripheral vascular disease) ([place])    rt renal artery stent  . Renal insufficiency   . Thrombocytopenia ([place])   . Thrombotic stroke ([place]) 09/10/2011   January 18, 2011 infarct genu & post limb R internal capsule - acute; previous lacunar infarcts/extensive white matter dis  . Vitamin D deficiency    Past Surgical History:  Procedure Laterality Date  . APPENDECTOMY     patient ?  Marland Kitchen CARDIAC CATHETERIZATION    . COLONOSCOPY    . CORONARY ANGIOPLASTY    . CYSTOURETHROSCOPY     ROBOTIC ARM NUCLETRON SEED IMPLANTATION OF PROSTATE  . EXPLORATORY LAPAROTOMY     For evaluation of lymphoma   Current Outpatient Medications on File Prior to Visit  Medication Sig  Dispense Refill  . acetaminophen (TYLENOL) 500 MG tablet Take 1,000 mg by mouth every 6 (six) hours as needed for mild pain, moderate pain or headache.     . clopidogrel (PLAVIX) 75 MG tablet TAKE 1 TABLET BY MOUTH ONCE A DAY WITH BREAKFAST 90 tablet 3  . cyanocobalamin (,VITAMIN B-12,) 1000 MCG/ML injection Inject 1 mL (1,000 mcg total) into the muscle every 30 (thirty) days. 10 mL 3  . Dextromethorphan Polistirex (DELSYM PO) Take 15 mLs by mouth 3 times/day as needed-between meals & bedtime (cough).     . furosemide (LASIX) 40 MG tablet TAKE 1 TABLET BY MOUTH ONCE A DAY 90 tablet 3  . glucose blood test strip Check BS TID 300 each 3  . hydrocortisone cream 1 % Apply 1 application topically at bedtime as needed for itching.    . Insulin Glargine (LANTUS SOLOSTAR) 100 UNIT/ML Solostar Pen INJECT 25 UNITS EVERY NIGHT AT BEDTIME (Patient taking differently: 15 Units. INJECT 25 UNITS EVERY NIGHT AT BEDTIME) 5 pen 3  . meclizine (ANTIVERT) 12.5 MG tablet Take 1 tablet (12.5 mg total) by mouth 3 (three) times daily as needed for dizziness. 30 tablet 0  . metoprolol succinate (TOPROL-XL) 100 MG 24 hr tablet TAKE 1 TABLET BY MOUTH ONCE A DAY 90 tablet 3  . Multiple Vitamin (MULTIVITAMIN WITH MINERALS) TABS tablet Take 1 tablet by mouth at bedtime.    Marland Kitchen OVER THE COUNTER MEDICATION Place 1 drop into both eyes daily as needed (dry eyes). Over the counter lubricating eye drop    . SYRINGE-NEEDLE, DISP, 3 ML (LUER LOCK SAFETY SYRINGES) 25G X 1" 3 ML MISC Use with B 12 injections q month 100 each 3  . tamsulosin (FLOMAX) 0.4 MG CAPS capsule TAKE 1 CAPSULE BY MOUTH ONCE DAILY 90 capsule 3   Current Facility-Administered Medications on File Prior to Visit  Medication Dose Route Frequency Provider Last Rate Last Dose  . cyanocobalamin ((VITAMIN B-12)) injection 1,000 mcg  1,000 mcg Subcutaneous Q30 days [name], MD   1,000 mcg at 07/06/16 1430  . romiPLOStim (NPLATE) injection 170 mcg  170 mcg  Subcutaneous Weekly [name], Ni, MD   170 mcg at 11/25/16 1345   Allergies  Allergen Reactions  . Glucophage [Metformin Hydrochloride] Other (See Comments)    Chest pain  . Zetia [Ezetimibe] Other (See Comments)    weakness  . Fenofibrate Rash  . Niacin-Lovastatin Er Rash   Social History   Socioeconomic History  . Marital status: Married    Spouse name: Not on file  . Number of children: 4  . Years of education: Not on file  . Highest education level: Not on file  Social  Needs  . Financial resource strain: Not on file  . Food insecurity - worry: Not on file  . Food insecurity - inability: Not on file  . Transportation needs - medical: Not on file  . Transportation needs - non-medical: Not on file  Occupational History  . Occupation: retired  Tobacco Use  . Smoking status: Former Smoker    Packs/day: 0.50    Years: 20.00    Pack years: 10.00    Types: Pipe, Cigarettes    Last attempt to quit: 06/21/1958    Years since quitting: 58.9  . Smokeless tobacco: Never Used  Substance and Sexual Activity  . Alcohol use: No    Alcohol/week: 0.0 oz  . Drug use: No  . Sexual activity: Yes    Partners: Female  Other Topics Concern  . Not on file  Social History Narrative  . Not on file      Review of Systems  All other systems reviewed and are negative.      Objective:   Physical Exam  Constitutional: He appears well-developed and well-nourished. No distress.  HENT:  Head: Normocephalic and atraumatic.    Right Ear: External ear normal.  Left Ear: External ear normal.  Nose: Nose normal.  Mouth/Throat: Oropharynx is clear and moist. No oropharyngeal exudate.  Eyes: Conjunctivae and EOM are normal. Pupils are equal, round, and reactive to light. Right eye exhibits no discharge. Left eye exhibits no discharge.  Neck: Neck supple.  Cardiovascular: Normal rate, regular rhythm and normal heart sounds.  No murmur heard. Pulmonary/Chest: Effort normal. No respiratory  distress. He has no wheezes. He has no rales. He exhibits no tenderness.  Abdominal: Soft. Bowel sounds are normal. He exhibits no distension and no mass. There is no tenderness. There is no rebound and no guarding.  Lymphadenopathy:    He has no cervical adenopathy.  Skin: Rash noted. He is not diaphoretic.  Vitals reviewed.         Assessment & Plan:  CKD (chronic kidney disease) stage 4, GFR 15-29 ml/min ([facility]) - Plan: Basic Metabolic Panel  Neoplasm of uncertain behavior of cheek - Plan: Pathology I anesthetize lesion #1 with 0.1% lidocaine with epinephrine.  I performed a shave biopsy and the lesion was sent to pathology and labeled container.  This procedure was performed using sterile technique.  Hemostasis was achieved with Drysol and a Band-Aid.  I suspect that all 3 lesions will require wider excision by Mohs surgery.  I will treat the patient's pain in his shoulder with tramadol 50 mg every 8 hours as needed.  He can return in 2 weeks for a cortisone injection and shoulders if necessary.  His pneumonia is clinically improved.  I will recheck his renal function while he is here today off antibiotics now.

## 2017-05-03 LAB — BASIC METABOLIC PANEL
BUN / CREAT RATIO: 17 (calc) (ref 6–22)
BUN: 31 mg/dL — ABNORMAL HIGH (ref 7–25)
CHLORIDE: 101 mmol/L (ref 98–110)
CO2: 28 mmol/L (ref 20–32)
Calcium: 8.9 mg/dL (ref 8.6–10.3)
Creat: 1.87 mg/dL — ABNORMAL HIGH (ref 0.70–1.11)
Glucose, Bld: 333 mg/dL — ABNORMAL HIGH (ref 65–99)
Potassium: 4.4 mmol/L (ref 3.5–5.3)
Sodium: 135 mmol/L (ref 135–146)

## 2017-05-05 ENCOUNTER — Other Ambulatory Visit (HOSPITAL_BASED_OUTPATIENT_CLINIC_OR_DEPARTMENT_OTHER): Payer: Medicare Other

## 2017-05-05 ENCOUNTER — Ambulatory Visit (HOSPITAL_BASED_OUTPATIENT_CLINIC_OR_DEPARTMENT_OTHER): Payer: Medicare Other

## 2017-05-05 VITALS — BP 145/66 | HR 70 | Temp 97.6°F | Resp 16

## 2017-05-05 DIAGNOSIS — D631 Anemia in chronic kidney disease: Secondary | ICD-10-CM

## 2017-05-05 DIAGNOSIS — D46C Myelodysplastic syndrome with isolated del(5q) chromosomal abnormality: Secondary | ICD-10-CM

## 2017-05-05 DIAGNOSIS — D693 Immune thrombocytopenic purpura: Secondary | ICD-10-CM

## 2017-05-05 DIAGNOSIS — D696 Thrombocytopenia, unspecified: Secondary | ICD-10-CM

## 2017-05-05 DIAGNOSIS — N183 Chronic kidney disease, stage 3 unspecified: Secondary | ICD-10-CM

## 2017-05-05 DIAGNOSIS — D539 Nutritional anemia, unspecified: Secondary | ICD-10-CM

## 2017-05-05 LAB — CBC WITH DIFFERENTIAL/PLATELET
BASO%: 0.3 % (ref 0.0–2.0)
BASOS ABS: 0 10*3/uL (ref 0.0–0.1)
EOS%: 0 % (ref 0.0–7.0)
Eosinophils Absolute: 0 10*3/uL (ref 0.0–0.5)
HCT: 37.4 % — ABNORMAL LOW (ref 38.4–49.9)
HGB: 11.2 g/dL — ABNORMAL LOW (ref 13.0–17.1)
LYMPH%: 55.3 % — AB (ref 14.0–49.0)
MCH: 23.8 pg — AB (ref 27.2–33.4)
MCHC: 29.9 g/dL — AB (ref 32.0–36.0)
MCV: 79.6 fL (ref 79.3–98.0)
MONO#: 0.6 10*3/uL (ref 0.1–0.9)
MONO%: 15.6 % — AB (ref 0.0–14.0)
NEUT#: 1.1 10*3/uL — ABNORMAL LOW (ref 1.5–6.5)
NEUT%: 28.8 % — AB (ref 39.0–75.0)
Platelets: 73 10*3/uL — ABNORMAL LOW (ref 140–400)
RBC: 4.7 10*6/uL (ref 4.20–5.82)
RDW: 23.8 % — ABNORMAL HIGH (ref 11.0–14.6)
WBC: 3.7 10*3/uL — ABNORMAL LOW (ref 4.0–10.3)
lymph#: 2 10*3/uL (ref 0.9–3.3)
nRBC: 0 % (ref 0–0)

## 2017-05-05 LAB — TISSUE SPECIMEN

## 2017-05-05 LAB — TECHNOLOGIST REVIEW

## 2017-05-05 LAB — PATHOLOGY

## 2017-05-05 MED ORDER — CYANOCOBALAMIN 1000 MCG/ML IJ SOLN
INTRAMUSCULAR | Status: AC
Start: 2017-05-05 — End: 2017-05-05
  Filled 2017-05-05: qty 1

## 2017-05-05 MED ORDER — CYANOCOBALAMIN 1000 MCG/ML IJ SOLN
1000.0000 ug | Freq: Once | INTRAMUSCULAR | Status: AC
  Administered 2017-05-05: 1000 ug via INTRAMUSCULAR

## 2017-05-05 MED ORDER — ROMIPLOSTIM INJECTION 500 MCG
3.0000 ug/kg | SUBCUTANEOUS | Status: DC
  Administered 2017-05-05: 255 ug via SUBCUTANEOUS
  Filled 2017-05-05: qty 0.51

## 2017-05-10 ENCOUNTER — Other Ambulatory Visit: Payer: Self-pay

## 2017-05-10 DIAGNOSIS — D0439 Carcinoma in situ of skin of other parts of face: Secondary | ICD-10-CM

## 2017-05-13 ENCOUNTER — Ambulatory Visit (HOSPITAL_BASED_OUTPATIENT_CLINIC_OR_DEPARTMENT_OTHER): Payer: Medicare Other

## 2017-05-13 ENCOUNTER — Other Ambulatory Visit (HOSPITAL_BASED_OUTPATIENT_CLINIC_OR_DEPARTMENT_OTHER): Payer: Medicare Other

## 2017-05-13 VITALS — BP 158/70 | HR 66 | Temp 97.8°F | Resp 16

## 2017-05-13 DIAGNOSIS — D696 Thrombocytopenia, unspecified: Secondary | ICD-10-CM

## 2017-05-13 DIAGNOSIS — D693 Immune thrombocytopenic purpura: Secondary | ICD-10-CM

## 2017-05-13 DIAGNOSIS — D539 Nutritional anemia, unspecified: Secondary | ICD-10-CM

## 2017-05-13 DIAGNOSIS — N183 Chronic kidney disease, stage 3 unspecified: Secondary | ICD-10-CM

## 2017-05-13 DIAGNOSIS — D631 Anemia in chronic kidney disease: Secondary | ICD-10-CM

## 2017-05-13 DIAGNOSIS — D46C Myelodysplastic syndrome with isolated del(5q) chromosomal abnormality: Secondary | ICD-10-CM | POA: Diagnosis not present

## 2017-05-13 LAB — CBC WITH DIFFERENTIAL/PLATELET
BASO%: 0.2 % (ref 0.0–2.0)
Basophils Absolute: 0 10*3/uL (ref 0.0–0.1)
EOS%: 0 % (ref 0.0–7.0)
Eosinophils Absolute: 0 10*3/uL (ref 0.0–0.5)
HCT: 35.3 % — ABNORMAL LOW (ref 38.4–49.9)
HGB: 10.5 g/dL — ABNORMAL LOW (ref 13.0–17.1)
LYMPH%: 36.3 % (ref 14.0–49.0)
MCH: 23.8 pg — AB (ref 27.2–33.4)
MCHC: 29.7 g/dL — AB (ref 32.0–36.0)
MCV: 79.9 fL (ref 79.3–98.0)
MONO#: 0.9 10*3/uL (ref 0.1–0.9)
MONO%: 17.5 % — AB (ref 0.0–14.0)
NEUT%: 46 % (ref 39.0–75.0)
NEUTROS ABS: 2.3 10*3/uL (ref 1.5–6.5)
Platelets: 67 10*3/uL — ABNORMAL LOW (ref 140–400)
RBC: 4.42 10*6/uL (ref 4.20–5.82)
RDW: 24.3 % — ABNORMAL HIGH (ref 11.0–14.6)
WBC: 5.1 10*3/uL (ref 4.0–10.3)
lymph#: 1.9 10*3/uL (ref 0.9–3.3)
nRBC: 0 % (ref 0–0)

## 2017-05-13 LAB — TECHNOLOGIST REVIEW

## 2017-05-13 MED ORDER — ROMIPLOSTIM INJECTION 500 MCG
3.0000 ug/kg | SUBCUTANEOUS | Status: DC
  Administered 2017-05-13: 255 ug via SUBCUTANEOUS
  Filled 2017-05-13: qty 0.51

## 2017-05-13 NOTE — Patient Instructions (Signed)
Romiplostim injection What is this medicine? ROMIPLOSTIM (roe mi PLOE stim) helps your body make more platelets. This medicine is used to treat low platelets caused by chronic idiopathic thrombocytopenic purpura (ITP). This medicine may be used for other purposes; ask your health care provider or pharmacist if you have questions. COMMON BRAND NAME(S): Nplate What should I tell my health care provider before I take this medicine? They need to know if you have any of these conditions: -cancer or myelodysplastic syndrome -low blood counts, like low white cell, platelet, or red cell counts -take medicines that treat or prevent blood clots -an unusual or allergic reaction to romiplostim, mannitol, other medicines, foods, dyes, or preservatives -pregnant or trying to get pregnant -breast-feeding How should I use this medicine? This medicine is for injection under the skin. It is given by a health care professional in a hospital or clinic setting. A special MedGuide will be given to you before your injection. Read this information carefully each time. Talk to your pediatrician regarding the use of this medicine in children. Special care may be needed. Overdosage: If you think you have taken too much of this medicine contact a poison control center or emergency room at once. NOTE: This medicine is only for you. Do not share this medicine with others. What if I miss a dose? It is important not to miss your dose. Call your doctor or health care professional if you are unable to keep an appointment. What may interact with this medicine? Interactions are not expected. This list may not describe all possible interactions. Give your health care provider a list of all the medicines, herbs, non-prescription drugs, or dietary supplements you use. Also tell them if you smoke, drink alcohol, or use illegal drugs. Some items may interact with your medicine. What should I watch for while using this  medicine? Your condition will be monitored carefully while you are receiving this medicine. Visit your prescriber or health care professional for regular checks on your progress and for the needed blood tests. It is important to keep all appointments. What side effects may I notice from receiving this medicine? Side effects that you should report to your doctor or health care professional as soon as possible: -allergic reactions like skin rash, itching or hives, swelling of the face, lips, or tongue -shortness of breath, chest pain, swelling in a leg -unusual bleeding or bruising Side effects that usually do not require medical attention (report to your doctor or health care professional if they continue or are bothersome): -dizziness -headache -muscle aches -pain in arms and legs -stomach pain -trouble sleeping This list may not describe all possible side effects. Call your doctor for medical advice about side effects. You may report side effects to FDA at 1-800-FDA-1088. Where should I keep my medicine? This drug is given in a hospital or clinic and will not be stored at home. NOTE: This sheet is a summary. It may not cover all possible information. If you have questions about this medicine, talk to your doctor, pharmacist, or health care provider.  2018 Elsevier/Gold Standard (2008-02-05 15:13:04)  

## 2017-05-19 ENCOUNTER — Other Ambulatory Visit (HOSPITAL_BASED_OUTPATIENT_CLINIC_OR_DEPARTMENT_OTHER): Payer: Medicare Other

## 2017-05-19 ENCOUNTER — Ambulatory Visit (HOSPITAL_BASED_OUTPATIENT_CLINIC_OR_DEPARTMENT_OTHER): Payer: Medicare Other

## 2017-05-19 VITALS — BP 143/58 | HR 18 | Temp 97.6°F

## 2017-05-19 DIAGNOSIS — N183 Chronic kidney disease, stage 3 unspecified: Secondary | ICD-10-CM

## 2017-05-19 DIAGNOSIS — D539 Nutritional anemia, unspecified: Secondary | ICD-10-CM

## 2017-05-19 DIAGNOSIS — D693 Immune thrombocytopenic purpura: Secondary | ICD-10-CM

## 2017-05-19 DIAGNOSIS — D46C Myelodysplastic syndrome with isolated del(5q) chromosomal abnormality: Secondary | ICD-10-CM

## 2017-05-19 DIAGNOSIS — D696 Thrombocytopenia, unspecified: Secondary | ICD-10-CM

## 2017-05-19 DIAGNOSIS — D631 Anemia in chronic kidney disease: Secondary | ICD-10-CM

## 2017-05-19 LAB — CBC WITH DIFFERENTIAL/PLATELET
BASO%: 0.2 % (ref 0.0–2.0)
BASOS ABS: 0 10*3/uL (ref 0.0–0.1)
EOS ABS: 0 10*3/uL (ref 0.0–0.5)
EOS%: 0.2 % (ref 0.0–7.0)
HCT: 37.5 % — ABNORMAL LOW (ref 38.4–49.9)
HGB: 11.2 g/dL — ABNORMAL LOW (ref 13.0–17.1)
LYMPH%: 38.3 % (ref 14.0–49.0)
MCH: 23.8 pg — AB (ref 27.2–33.4)
MCHC: 29.9 g/dL — AB (ref 32.0–36.0)
MCV: 79.6 fL (ref 79.3–98.0)
MONO#: 0.5 10*3/uL (ref 0.1–0.9)
MONO%: 9.9 % (ref 0.0–14.0)
NEUT#: 2.8 10*3/uL (ref 1.5–6.5)
NEUT%: 51.4 % (ref 39.0–75.0)
NRBC: 0 % (ref 0–0)
RBC: 4.71 10*6/uL (ref 4.20–5.82)
RDW: 24.3 % — AB (ref 11.0–14.6)
WBC: 5.4 10*3/uL (ref 4.0–10.3)
lymph#: 2.1 10*3/uL (ref 0.9–3.3)

## 2017-05-19 LAB — TECHNOLOGIST REVIEW

## 2017-05-19 MED ORDER — ROMIPLOSTIM 250 MCG ~~LOC~~ SOLR
250.0000 ug | SUBCUTANEOUS | Status: DC
  Administered 2017-05-19: 250 ug via SUBCUTANEOUS
  Filled 2017-05-19: qty 0.5

## 2017-05-19 NOTE — Patient Instructions (Signed)
Romiplostim injection What is this medicine? ROMIPLOSTIM (roe mi PLOE stim) helps your body make more platelets. This medicine is used to treat low platelets caused by chronic idiopathic thrombocytopenic purpura (ITP). This medicine may be used for other purposes; ask your health care provider or pharmacist if you have questions. COMMON BRAND NAME(S): Nplate What should I tell my health care provider before I take this medicine? They need to know if you have any of these conditions: -cancer or myelodysplastic syndrome -low blood counts, like low white cell, platelet, or red cell counts -take medicines that treat or prevent blood clots -an unusual or allergic reaction to romiplostim, mannitol, other medicines, foods, dyes, or preservatives -pregnant or trying to get pregnant -breast-feeding How should I use this medicine? This medicine is for injection under the skin. It is given by a health care professional in a hospital or clinic setting. A special MedGuide will be given to you before your injection. Read this information carefully each time. Talk to your pediatrician regarding the use of this medicine in children. Special care may be needed. Overdosage: If you think you have taken too much of this medicine contact a poison control center or emergency room at once. NOTE: This medicine is only for you. Do not share this medicine with others. What if I miss a dose? It is important not to miss your dose. Call your doctor or health care professional if you are unable to keep an appointment. What may interact with this medicine? Interactions are not expected. This list may not describe all possible interactions. Give your health care provider a list of all the medicines, herbs, non-prescription drugs, or dietary supplements you use. Also tell them if you smoke, drink alcohol, or use illegal drugs. Some items may interact with your medicine. What should I watch for while using this  medicine? Your condition will be monitored carefully while you are receiving this medicine. Visit your prescriber or health care professional for regular checks on your progress and for the needed blood tests. It is important to keep all appointments. What side effects may I notice from receiving this medicine? Side effects that you should report to your doctor or health care professional as soon as possible: -allergic reactions like skin rash, itching or hives, swelling of the face, lips, or tongue -shortness of breath, chest pain, swelling in a leg -unusual bleeding or bruising Side effects that usually do not require medical attention (report to your doctor or health care professional if they continue or are bothersome): -dizziness -headache -muscle aches -pain in arms and legs -stomach pain -trouble sleeping This list may not describe all possible side effects. Call your doctor for medical advice about side effects. You may report side effects to FDA at 1-800-FDA-1088. Where should I keep my medicine? This drug is given in a hospital or clinic and will not be stored at home. NOTE: This sheet is a summary. It may not cover all possible information. If you have questions about this medicine, talk to your doctor, pharmacist, or health care provider.  2018 Elsevier/Gold Standard (2008-02-05 15:13:04)  

## 2017-05-26 ENCOUNTER — Ambulatory Visit (HOSPITAL_BASED_OUTPATIENT_CLINIC_OR_DEPARTMENT_OTHER): Payer: Medicare Other

## 2017-05-26 ENCOUNTER — Telehealth: Payer: Self-pay | Admitting: Hematology and Oncology

## 2017-05-26 ENCOUNTER — Other Ambulatory Visit: Payer: Self-pay

## 2017-05-26 ENCOUNTER — Other Ambulatory Visit (HOSPITAL_BASED_OUTPATIENT_CLINIC_OR_DEPARTMENT_OTHER): Payer: Medicare Other

## 2017-05-26 ENCOUNTER — Ambulatory Visit (HOSPITAL_BASED_OUTPATIENT_CLINIC_OR_DEPARTMENT_OTHER): Payer: Medicare Other | Admitting: Hematology and Oncology

## 2017-05-26 ENCOUNTER — Encounter: Payer: Self-pay | Admitting: Hematology and Oncology

## 2017-05-26 DIAGNOSIS — D539 Nutritional anemia, unspecified: Secondary | ICD-10-CM

## 2017-05-26 DIAGNOSIS — D46C Myelodysplastic syndrome with isolated del(5q) chromosomal abnormality: Secondary | ICD-10-CM

## 2017-05-26 DIAGNOSIS — E538 Deficiency of other specified B group vitamins: Secondary | ICD-10-CM

## 2017-05-26 DIAGNOSIS — D696 Thrombocytopenia, unspecified: Secondary | ICD-10-CM

## 2017-05-26 DIAGNOSIS — D631 Anemia in chronic kidney disease: Secondary | ICD-10-CM

## 2017-05-26 DIAGNOSIS — N183 Chronic kidney disease, stage 3 unspecified: Secondary | ICD-10-CM

## 2017-05-26 DIAGNOSIS — D693 Immune thrombocytopenic purpura: Secondary | ICD-10-CM

## 2017-05-26 LAB — CBC WITH DIFFERENTIAL/PLATELET
BASO%: 0.6 % (ref 0.0–2.0)
BASOS ABS: 0 10*3/uL (ref 0.0–0.1)
EOS%: 0.6 % (ref 0.0–7.0)
Eosinophils Absolute: 0 10*3/uL (ref 0.0–0.5)
HEMATOCRIT: 37.3 % — AB (ref 38.4–49.9)
HEMOGLOBIN: 11.1 g/dL — AB (ref 13.0–17.1)
LYMPH#: 1.5 10*3/uL (ref 0.9–3.3)
LYMPH%: 46.7 % (ref 14.0–49.0)
MCH: 23.9 pg — AB (ref 27.2–33.4)
MCHC: 29.8 g/dL — ABNORMAL LOW (ref 32.0–36.0)
MCV: 80.2 fL (ref 79.3–98.0)
MONO#: 0.5 10*3/uL (ref 0.1–0.9)
MONO%: 16 % — ABNORMAL HIGH (ref 0.0–14.0)
NEUT#: 1.2 10*3/uL — ABNORMAL LOW (ref 1.5–6.5)
NEUT%: 36.1 % — AB (ref 39.0–75.0)
NRBC: 0 % (ref 0–0)
Platelets: 93 10*3/uL — ABNORMAL LOW (ref 140–400)
RBC: 4.65 10*6/uL (ref 4.20–5.82)
RDW: 24.5 % — AB (ref 11.0–14.6)
WBC: 3.2 10*3/uL — ABNORMAL LOW (ref 4.0–10.3)

## 2017-05-26 LAB — TECHNOLOGIST REVIEW

## 2017-05-26 MED ORDER — CYANOCOBALAMIN 1000 MCG/ML IJ SOLN
INTRAMUSCULAR | Status: AC
  Filled 2017-05-26: qty 1

## 2017-05-26 MED ORDER — ROMIPLOSTIM INJECTION 500 MCG
3.0000 ug/kg | SUBCUTANEOUS | Status: DC
Start: 2017-05-26 — End: 2017-05-26
  Administered 2017-05-26: 255 ug via SUBCUTANEOUS
  Filled 2017-05-26: qty 0.51

## 2017-05-26 MED ORDER — CYANOCOBALAMIN 1000 MCG/ML IJ SOLN
1000.0000 ug | Freq: Once | INTRAMUSCULAR | Status: AC
  Administered 2017-05-26: 1000 ug via INTRAMUSCULAR

## 2017-05-26 NOTE — Assessment & Plan Note (Signed)
he will continue current medical management. I recommend close follow-up with primary care doctor for medication adjustment.  

## 2017-05-26 NOTE — Assessment & Plan Note (Signed)
He has remarkable response to Nplate injection.  He has recent resolution of neutropenia, improvement of anemia as well as improvement of thrombocytopenia. He will continue monthly vitamin B12 injection along with Nplate injection today. I will see him back in 3 months. The goal of injection is to keep platelet count greater than 50,000 He will get darbepoetin injection to keep hemoglobin greater than 10

## 2017-05-26 NOTE — Progress Notes (Signed)
[facility] OFFICE PROGRESS NOTE  Patient Care Team: [name], MD as PCP - General (Family Medicine) Heath Lark, MD as Consulting Physician (Hematology and Oncology) [name], MD as Consulting Physician (Cardiology)  SUMMARY OF ONCOLOGIC HISTORY: Oncology History   Calculated R-IPSS score at low risk     MDS (myelodysplastic syndrome) with 5q deletion ([place])   11/07/2014 Bone Marrow Biopsy    Accession: [id number] marrow biopsy showed myelodysplastic syndrome with 5q deletion.      04/02/2015 - 07/01/2016 Chemotherapy    He started taking Promacta daily for thrombocytopenia, stopped due to lack of response      07/15/2016 Miscellaneous    Starting July 15, 2016, he is receiving Nplate injection along with darbepoetin       INTERVAL HISTORY: Please see below for problem oriented charting. He returns for further follow-up He is doing very well He had recent pneumonia and is recovering well He has mild shortness of breath and cough Energy level is fair The patient denies any recent signs or symptoms of bleeding such as spontaneous epistaxis, hematuria or hematochezia.  REVIEW OF SYSTEMS:   Constitutional: Denies fevers, chills or abnormal weight loss Eyes: Denies blurriness of vision Ears, nose, mouth, throat, and face: Denies mucositis or sore throat Respiratory: Denies cough, dyspnea or wheezes Cardiovascular: Denies palpitation, chest discomfort or lower extremity swelling Gastrointestinal:  Denies nausea, heartburn or change in bowel habits Skin: Denies abnormal skin rashes Lymphatics: Denies new lymphadenopathy or easy bruising Neurological:Denies numbness, tingling or new weaknesses Behavioral/Psych: Mood is stable, no new changes  All other systems were reviewed with the patient and are negative.  I have reviewed the past medical history, past surgical history, social history and family history with the patient and they are  unchanged from previous note.  ALLERGIES:  is allergic to glucophage [metformin hydrochloride]; zetia [ezetimibe]; fenofibrate; and niacin-lovastatin er.  MEDICATIONS:  Current Outpatient Medications  Medication Sig Dispense Refill  . acetaminophen (TYLENOL) 500 MG tablet Take 1,000 mg by mouth every 6 (six) hours as needed for mild pain, moderate pain or headache.     . clopidogrel (PLAVIX) 75 MG tablet TAKE 1 TABLET BY MOUTH ONCE A DAY WITH BREAKFAST 90 tablet 3  . cyanocobalamin (,VITAMIN B-12,) 1000 MCG/ML injection Inject 1 mL (1,000 mcg total) into the muscle every 30 (thirty) days. 10 mL 3  . Dextromethorphan Polistirex (DELSYM PO) Take 15 mLs by mouth 3 times/day as needed-between meals & bedtime (cough).     . furosemide (LASIX) 40 MG tablet TAKE 1 TABLET BY MOUTH ONCE A DAY 90 tablet 3  . glucose blood test strip Check BS TID 300 each 3  . hydrocortisone cream 1 % Apply 1 application topically at bedtime as needed for itching.    . Insulin Glargine (LANTUS SOLOSTAR) 100 UNIT/ML Solostar Pen INJECT 25 UNITS EVERY NIGHT AT BEDTIME (Patient taking differently: 15 Units. INJECT 25 UNITS EVERY NIGHT AT BEDTIME) 5 pen 3  . meclizine (ANTIVERT) 12.5 MG tablet Take 1 tablet (12.5 mg total) by mouth 3 (three) times daily as needed for dizziness. 30 tablet 0  . metoprolol succinate (TOPROL-XL) 100 MG 24 hr tablet TAKE 1 TABLET BY MOUTH ONCE A DAY 90 tablet 3  . Multiple Vitamin (MULTIVITAMIN WITH MINERALS) TABS tablet Take 1 tablet by mouth at bedtime.    Marland Kitchen OVER THE COUNTER MEDICATION Place 1 drop into both eyes daily as needed (dry eyes). Over the counter lubricating eye drop    .  SYRINGE-NEEDLE, DISP, 3 ML (LUER LOCK SAFETY SYRINGES) 25G X 1" 3 ML MISC Use with B 12 injections q month 100 each 3  . tamsulosin (FLOMAX) 0.4 MG CAPS capsule TAKE 1 CAPSULE BY MOUTH ONCE DAILY 90 capsule 3  . traMADol (ULTRAM) 50 MG tablet Take 1 tablet (50 mg total) every 8 (eight) hours as needed by mouth. 30  tablet 0   Current Facility-Administered Medications  Medication Dose Route Frequency Provider Last Rate Last Dose  . cyanocobalamin ((VITAMIN B-12)) injection 1,000 mcg  1,000 mcg Subcutaneous Q30 days [name], MD   1,000 mcg at 07/06/16 1430   Facility-Administered Medications Ordered in Other Visits  Medication Dose Route Frequency Provider Last Rate Last Dose  . cyanocobalamin ((VITAMIN B-12)) injection 1,000 mcg  1,000 mcg Intramuscular Once Jennings Corado, MD      . romiPLOStim (NPLATE) injection 170 mcg  170 mcg Subcutaneous Weekly [name], Shirlie Enck, MD   170 mcg at 11/25/16 1345  . romiPLOStim (NPLATE) injection 255 mcg  3 mcg/kg Subcutaneous Weekly Mitchelle Goerner, MD        PHYSICAL EXAMINATION: ECOG PERFORMANCE STATUS: 1 - Symptomatic but completely ambulatory  Vitals:   05/26/17 1446  BP: (!) 152/80  Pulse: 78  Resp: 17  Temp: (!) 97.5 F (36.4 C)  SpO2: 100%   Filed Weights   05/26/17 1446  Weight: 189 lb (85.7 kg)    GENERAL:alert, no distress and comfortable SKIN: skin color, texture, turgor are normal, no rashes or significant lesions.  Noted skin bruising EYES: normal, Conjunctiva are pink and non-injected, sclera clear OROPHARYNX:no exudate, no erythema and lips, buccal mucosa, and tongue normal  NECK: supple, thyroid normal size, non-tender, without nodularity LYMPH:  no palpable lymphadenopathy in the cervical, axillary or inguinal LUNGS: clear to auscultation and percussion with normal breathing effort HEART: regular rate & rhythm and no murmurs and no lower extremity edema ABDOMEN:abdomen soft, non-tender and normal bowel sounds Musculoskeletal:no cyanosis of digits and no clubbing  NEURO: alert & oriented x 3 with fluent speech, no focal motor/sensory deficits  LABORATORY DATA:  I have reviewed the data as listed    Component Value Date/Time   NA 135 05/02/2017 1459   NA 138 03/02/2016 1238   K 4.4 05/02/2017 1459   K 4.6 03/02/2016 1238   CL 101  05/02/2017 1459   CL 107 09/27/2012 0940   CO2 28 05/02/2017 1459   CO2 25 03/02/2016 1238   GLUCOSE 333 (H) 05/02/2017 1459   GLUCOSE 163 (H) 03/02/2016 1238   GLUCOSE 89 09/27/2012 0940   BUN 31 (H) 05/02/2017 1459   BUN 20.9 03/02/2016 1238   CREATININE 1.87 (H) 05/02/2017 1459   CREATININE 1.8 (H) 03/02/2016 1238   CALCIUM 8.9 05/02/2017 1459   CALCIUM 8.4 03/02/2016 1238   PROT 7.1 12/15/2016 1133   PROT 8.2 03/02/2016 1238   ALBUMIN 3.6 12/15/2016 1133   ALBUMIN 2.6 (L) 03/02/2016 1238   AST 15 12/15/2016 1133   AST 21 03/02/2016 1238   ALT 14 12/15/2016 1133   ALT 29 03/02/2016 1238   ALKPHOS 64 12/15/2016 1133   ALKPHOS 84 03/02/2016 1238   BILITOT 0.4 12/15/2016 1133   BILITOT <0.30 03/02/2016 1238   GFRNONAA 24 (L) 04/25/2017 1502   GFRAA 28 (L) 04/25/2017 1502    No results found for: SPEP, UPEP  Lab Results  Component Value Date   WBC 3.2 (L) 05/26/2017   NEUTROABS 1.2 (L) 05/26/2017   HGB 11.1 (L)  05/26/2017   HCT 37.3 (L) 05/26/2017   MCV 80.2 05/26/2017   PLT 93 (L) 05/26/2017      Chemistry      Component Value Date/Time   NA 135 05/02/2017 1459   NA 138 03/02/2016 1238   K 4.4 05/02/2017 1459   K 4.6 03/02/2016 1238   CL 101 05/02/2017 1459   CL 107 09/27/2012 0940   CO2 28 05/02/2017 1459   CO2 25 03/02/2016 1238   BUN 31 (H) 05/02/2017 1459   BUN 20.9 03/02/2016 1238   CREATININE 1.87 (H) 05/02/2017 1459   CREATININE 1.8 (H) 03/02/2016 1238      Component Value Date/Time   CALCIUM 8.9 05/02/2017 1459   CALCIUM 8.4 03/02/2016 1238   ALKPHOS 64 12/15/2016 1133   ALKPHOS 84 03/02/2016 1238   AST 15 12/15/2016 1133   AST 21 03/02/2016 1238   ALT 14 12/15/2016 1133   ALT 29 03/02/2016 1238   BILITOT 0.4 12/15/2016 1133   BILITOT <0.30 03/02/2016 1238      ASSESSMENT & PLAN:  MDS (myelodysplastic syndrome) with 5q deletion He has remarkable response to Nplate injection.  He has recent resolution of neutropenia, improvement of  anemia as well as improvement of thrombocytopenia. He will continue monthly vitamin B12 injection along with Nplate injection today. I will see him back in 3 months. The goal of injection is to keep platelet count greater than 50,000 He will get darbepoetin injection to keep hemoglobin greater than 10  Vitamin B12 deficiency I plan to recheck B12 level next month  Chronic renal failure, stage 3 (moderate) he will continue current medical management. I recommend close follow-up with primary care doctor for medication adjustment.    No orders of the defined types were placed in this encounter.  All questions were answered. The patient knows to call the clinic with any problems, questions or concerns. No barriers to learning was detected. I spent 15 minutes counseling the patient face to face. The total time spent in the appointment was 20 minutes and more than 50% was on counseling and review of test results     Heath Lark, MD 05/26/2017 3:15 PM

## 2017-05-26 NOTE — Assessment & Plan Note (Signed)
I plan to recheck B12 level next month

## 2017-05-26 NOTE — Telephone Encounter (Signed)
Gave avs and calendar for December - March 2019

## 2017-06-01 ENCOUNTER — Ambulatory Visit (HOSPITAL_BASED_OUTPATIENT_CLINIC_OR_DEPARTMENT_OTHER)
Admission: RE | Admit: 2017-06-01 | Discharge: 2017-06-01 | Disposition: A | Discharge status: Home / Self Care | Payer: Medicare Other | Source: Ambulatory Visit | Attending: Cardiology | Admitting: Cardiology

## 2017-06-01 DIAGNOSIS — A419 Sepsis, unspecified organism: Secondary | ICD-10-CM | POA: Diagnosis not present

## 2017-06-01 DIAGNOSIS — I6523 Occlusion and stenosis of bilateral carotid arteries: Secondary | ICD-10-CM

## 2017-06-01 DIAGNOSIS — R531 Weakness: Secondary | ICD-10-CM | POA: Diagnosis not present

## 2017-06-01 DIAGNOSIS — K5790 Diverticulosis of intestine, part unspecified, without perforation or abscess without bleeding: Secondary | ICD-10-CM | POA: Diagnosis not present

## 2017-06-01 DIAGNOSIS — E872 Acidosis: Secondary | ICD-10-CM | POA: Diagnosis not present

## 2017-06-01 DIAGNOSIS — D61818 Other pancytopenia: Secondary | ICD-10-CM | POA: Diagnosis not present

## 2017-06-01 DIAGNOSIS — N179 Acute kidney failure, unspecified: Secondary | ICD-10-CM | POA: Diagnosis not present

## 2017-06-01 DIAGNOSIS — D693 Immune thrombocytopenic purpura: Secondary | ICD-10-CM | POA: Diagnosis not present

## 2017-06-01 DIAGNOSIS — D46C Myelodysplastic syndrome with isolated del(5q) chromosomal abnormality: Secondary | ICD-10-CM | POA: Diagnosis not present

## 2017-06-01 DIAGNOSIS — I130 Hypertensive heart and chronic kidney disease with heart failure and stage 1 through stage 4 chronic kidney disease, or unspecified chronic kidney disease: Secondary | ICD-10-CM | POA: Diagnosis not present

## 2017-06-01 DIAGNOSIS — R0602 Shortness of breath: Secondary | ICD-10-CM | POA: Diagnosis not present

## 2017-06-02 ENCOUNTER — Other Ambulatory Visit: Payer: Self-pay

## 2017-06-02 ENCOUNTER — Ambulatory Visit: Payer: Medicare Other

## 2017-06-02 ENCOUNTER — Emergency Department (HOSPITAL_COMMUNITY): Payer: Medicare Other

## 2017-06-02 ENCOUNTER — Inpatient Hospital Stay (HOSPITAL_COMMUNITY)
Admission: EM | Admit: 2017-06-02 | Discharge: 2017-06-04 | DRG: 683 | Disposition: A | Discharge status: Home / Self Care | Payer: Medicare Other | Attending: Internal Medicine | Admitting: Internal Medicine

## 2017-06-02 ENCOUNTER — Encounter: Payer: Self-pay | Admitting: Family Medicine

## 2017-06-02 ENCOUNTER — Encounter (HOSPITAL_COMMUNITY): Payer: Self-pay | Admitting: Emergency Medicine

## 2017-06-02 ENCOUNTER — Other Ambulatory Visit: Payer: Medicare Other

## 2017-06-02 ENCOUNTER — Ambulatory Visit (INDEPENDENT_AMBULATORY_CARE_PROVIDER_SITE_OTHER): Payer: Medicare Other | Admitting: Family Medicine

## 2017-06-02 VITALS — BP 128/70 | HR 64 | Temp 99.2°F | Resp 16

## 2017-06-02 DIAGNOSIS — T451X5A Adverse effect of antineoplastic and immunosuppressive drugs, initial encounter: Secondary | ICD-10-CM | POA: Diagnosis present

## 2017-06-02 DIAGNOSIS — Z794 Long term (current) use of insulin: Secondary | ICD-10-CM | POA: Diagnosis not present

## 2017-06-02 DIAGNOSIS — Z95820 Peripheral vascular angioplasty status with implants and grafts: Secondary | ICD-10-CM | POA: Diagnosis not present

## 2017-06-02 DIAGNOSIS — I2510 Atherosclerotic heart disease of native coronary artery without angina pectoris: Secondary | ICD-10-CM | POA: Diagnosis present

## 2017-06-02 DIAGNOSIS — N179 Acute kidney failure, unspecified: Secondary | ICD-10-CM | POA: Diagnosis present

## 2017-06-02 DIAGNOSIS — D46C Myelodysplastic syndrome with isolated del(5q) chromosomal abnormality: Secondary | ICD-10-CM | POA: Diagnosis not present

## 2017-06-02 DIAGNOSIS — A419 Sepsis, unspecified organism: Secondary | ICD-10-CM | POA: Diagnosis not present

## 2017-06-02 DIAGNOSIS — I130 Hypertensive heart and chronic kidney disease with heart failure and stage 1 through stage 4 chronic kidney disease, or unspecified chronic kidney disease: Secondary | ICD-10-CM | POA: Diagnosis present

## 2017-06-02 DIAGNOSIS — K5790 Diverticulosis of intestine, part unspecified, without perforation or abscess without bleeding: Secondary | ICD-10-CM | POA: Diagnosis not present

## 2017-06-02 DIAGNOSIS — Z8546 Personal history of malignant neoplasm of prostate: Secondary | ICD-10-CM | POA: Diagnosis not present

## 2017-06-02 DIAGNOSIS — N184 Chronic kidney disease, stage 4 (severe): Secondary | ICD-10-CM | POA: Diagnosis not present

## 2017-06-02 DIAGNOSIS — X58XXXA Exposure to other specified factors, initial encounter: Secondary | ICD-10-CM | POA: Diagnosis present

## 2017-06-02 DIAGNOSIS — Z87891 Personal history of nicotine dependence: Secondary | ICD-10-CM

## 2017-06-02 DIAGNOSIS — R7989 Other specified abnormal findings of blood chemistry: Secondary | ICD-10-CM | POA: Diagnosis not present

## 2017-06-02 DIAGNOSIS — I6523 Occlusion and stenosis of bilateral carotid arteries: Secondary | ICD-10-CM | POA: Diagnosis not present

## 2017-06-02 DIAGNOSIS — I739 Peripheral vascular disease, unspecified: Secondary | ICD-10-CM | POA: Diagnosis present

## 2017-06-02 DIAGNOSIS — D696 Thrombocytopenia, unspecified: Secondary | ICD-10-CM | POA: Diagnosis present

## 2017-06-02 DIAGNOSIS — D693 Immune thrombocytopenic purpura: Secondary | ICD-10-CM | POA: Diagnosis present

## 2017-06-02 DIAGNOSIS — E1122 Type 2 diabetes mellitus with diabetic chronic kidney disease: Secondary | ICD-10-CM | POA: Diagnosis present

## 2017-06-02 DIAGNOSIS — R6889 Other general symptoms and signs: Secondary | ICD-10-CM | POA: Diagnosis not present

## 2017-06-02 DIAGNOSIS — E860 Dehydration: Secondary | ICD-10-CM | POA: Diagnosis present

## 2017-06-02 DIAGNOSIS — I5032 Chronic diastolic (congestive) heart failure: Secondary | ICD-10-CM | POA: Diagnosis present

## 2017-06-02 DIAGNOSIS — E538 Deficiency of other specified B group vitamins: Secondary | ICD-10-CM

## 2017-06-02 DIAGNOSIS — N189 Chronic kidney disease, unspecified: Secondary | ICD-10-CM

## 2017-06-02 DIAGNOSIS — D469 Myelodysplastic syndrome, unspecified: Secondary | ICD-10-CM | POA: Diagnosis present

## 2017-06-02 DIAGNOSIS — E1151 Type 2 diabetes mellitus with diabetic peripheral angiopathy without gangrene: Secondary | ICD-10-CM | POA: Diagnosis present

## 2017-06-02 DIAGNOSIS — D61818 Other pancytopenia: Secondary | ICD-10-CM | POA: Diagnosis present

## 2017-06-02 DIAGNOSIS — I10 Essential (primary) hypertension: Secondary | ICD-10-CM | POA: Diagnosis not present

## 2017-06-02 DIAGNOSIS — R531 Weakness: Secondary | ICD-10-CM

## 2017-06-02 DIAGNOSIS — Z8572 Personal history of non-Hodgkin lymphomas: Secondary | ICD-10-CM

## 2017-06-02 DIAGNOSIS — E785 Hyperlipidemia, unspecified: Secondary | ICD-10-CM | POA: Diagnosis present

## 2017-06-02 DIAGNOSIS — Z8673 Personal history of transient ischemic attack (TIA), and cerebral infarction without residual deficits: Secondary | ICD-10-CM

## 2017-06-02 DIAGNOSIS — R197 Diarrhea, unspecified: Secondary | ICD-10-CM | POA: Diagnosis present

## 2017-06-02 DIAGNOSIS — E872 Acidosis: Secondary | ICD-10-CM | POA: Diagnosis present

## 2017-06-02 DIAGNOSIS — Z7902 Long term (current) use of antithrombotics/antiplatelets: Secondary | ICD-10-CM | POA: Diagnosis not present

## 2017-06-02 DIAGNOSIS — E1129 Type 2 diabetes mellitus with other diabetic kidney complication: Secondary | ICD-10-CM | POA: Diagnosis not present

## 2017-06-02 DIAGNOSIS — R0602 Shortness of breath: Secondary | ICD-10-CM | POA: Diagnosis not present

## 2017-06-02 DIAGNOSIS — D72821 Monocytosis (symptomatic): Secondary | ICD-10-CM | POA: Diagnosis not present

## 2017-06-02 DIAGNOSIS — Z79899 Other long term (current) drug therapy: Secondary | ICD-10-CM

## 2017-06-02 DIAGNOSIS — Z888 Allergy status to other drugs, medicaments and biological substances status: Secondary | ICD-10-CM

## 2017-06-02 LAB — URINALYSIS, ROUTINE W REFLEX MICROSCOPIC
Bilirubin Urine: NEGATIVE
GLUCOSE, UA: 50 mg/dL — AB
HGB URINE DIPSTICK: NEGATIVE
Ketones, ur: NEGATIVE mg/dL
LEUKOCYTES UA: NEGATIVE
Nitrite: NEGATIVE
PROTEIN: 100 mg/dL — AB
Specific Gravity, Urine: 1.016 (ref 1.005–1.030)
pH: 5 (ref 5.0–8.0)

## 2017-06-02 LAB — CBC WITH DIFFERENTIAL/PLATELET
Basophils Absolute: 0 10*3/uL (ref 0.0–0.1)
Basophils Relative: 0 %
EOS PCT: 0 %
Eosinophils Absolute: 0 10*3/uL (ref 0.0–0.7)
HEMATOCRIT: 34 % — AB (ref 39.0–52.0)
HEMOGLOBIN: 10 g/dL — AB (ref 13.0–17.0)
LYMPHS ABS: 1.2 10*3/uL (ref 0.7–4.0)
Lymphocytes Relative: 15 %
MCH: 23.4 pg — AB (ref 26.0–34.0)
MCHC: 29.4 g/dL — AB (ref 30.0–36.0)
MCV: 79.4 fL (ref 78.0–100.0)
MONOS PCT: 41 %
Monocytes Absolute: 3.4 10*3/uL — ABNORMAL HIGH (ref 0.1–1.0)
NEUTROS ABS: 3.7 10*3/uL (ref 1.7–7.7)
Neutrophils Relative %: 44 %
Platelets: 88 10*3/uL — ABNORMAL LOW (ref 150–400)
RBC: 4.28 MIL/uL (ref 4.22–5.81)
RDW: 24.6 % — ABNORMAL HIGH (ref 11.5–15.5)
WBC: 8.3 10*3/uL (ref 4.0–10.5)

## 2017-06-02 LAB — I-STAT CG4 LACTIC ACID, ED
Lactic Acid, Venous: 2.55 mmol/L (ref 0.5–1.9)
Lactic Acid, Venous: 1.39 mmol/L (ref 0.5–1.9)

## 2017-06-02 LAB — COMPREHENSIVE METABOLIC PANEL
ALBUMIN: 3.4 g/dL — AB (ref 3.5–5.0)
ALK PHOS: 73 U/L (ref 38–126)
ALT: 20 U/L (ref 17–63)
AST: 23 U/L (ref 15–41)
Anion gap: 12 (ref 5–15)
BILIRUBIN TOTAL: 0.6 mg/dL (ref 0.3–1.2)
BUN: 20 mg/dL (ref 6–20)
CALCIUM: 8.9 mg/dL (ref 8.9–10.3)
CO2: 24 mmol/L (ref 22–32)
CREATININE: 2.16 mg/dL — AB (ref 0.61–1.24)
Chloride: 99 mmol/L — ABNORMAL LOW (ref 101–111)
GFR calc non Af Amer: 26 mL/min — ABNORMAL LOW (ref >60)
GFR, EST AFRICAN AMERICAN: 31 mL/min — AB (ref >60)
GLUCOSE: 286 mg/dL — AB (ref 65–99)
Potassium: 4 mmol/L (ref 3.5–5.1)
SODIUM: 135 mmol/L (ref 135–145)
Total Protein: 8.7 g/dL — ABNORMAL HIGH (ref 6.5–8.1)

## 2017-06-02 LAB — POC OCCULT BLOOD, ED: FECAL OCCULT BLD: NEGATIVE

## 2017-06-02 LAB — PROTIME-INR
INR: 1.1
Prothrombin Time: 14.2 seconds (ref 11.4–15.2)

## 2017-06-02 LAB — INFLUENZA A AND B AG, IMMUNOASSAY
INFLUENZA A ANTIGEN: NOT DETECTED
INFLUENZA B ANTIGEN: NOT DETECTED

## 2017-06-02 LAB — TYPE AND SCREEN
ABO/RH(D): O POS
Antibody Screen: NEGATIVE

## 2017-06-02 LAB — GLUCOSE, CAPILLARY: Glucose-Capillary: 168 mg/dL — ABNORMAL HIGH (ref 65–99)

## 2017-06-02 LAB — TSH: TSH: 1.992 u[IU]/mL (ref 0.350–4.500)

## 2017-06-02 LAB — ABO/RH: ABO/RH(D): O POS

## 2017-06-02 LAB — CBG MONITORING, ED: Glucose-Capillary: 289 mg/dL — ABNORMAL HIGH (ref 65–99)

## 2017-06-02 MED ORDER — INSULIN ASPART 100 UNIT/ML ~~LOC~~ SOLN
6.0000 [IU] | Freq: Once | SUBCUTANEOUS | Status: AC
  Administered 2017-06-02: 6 [IU] via SUBCUTANEOUS
  Filled 2017-06-02: qty 1

## 2017-06-02 MED ORDER — MECLIZINE HCL 25 MG PO TABS: 12.5000 mg | ORAL_TABLET | Freq: Three times a day (TID) | ORAL | Status: DC | PRN

## 2017-06-02 MED ORDER — ACETAMINOPHEN 325 MG PO TABS
650.0000 mg | ORAL_TABLET | Freq: Four times a day (QID) | ORAL | Status: DC | PRN
  Administered 2017-06-03: 650 mg via ORAL
  Filled 2017-06-02: qty 2

## 2017-06-02 MED ORDER — SODIUM CHLORIDE 0.9 % IV BOLUS (SEPSIS)
1000.0000 mL | Freq: Once | INTRAVENOUS | Status: AC
  Administered 2017-06-02: 1000 mL via INTRAVENOUS

## 2017-06-02 MED ORDER — ONDANSETRON HCL 4 MG/2ML IJ SOLN
4.0000 mg | Freq: Four times a day (QID) | INTRAMUSCULAR | Status: DC | PRN
  Administered 2017-06-02: 4 mg via INTRAVENOUS
  Filled 2017-06-02: qty 2

## 2017-06-02 MED ORDER — SENNOSIDES-DOCUSATE SODIUM 8.6-50 MG PO TABS: 1.0000 | ORAL_TABLET | Freq: Every evening | ORAL | Status: DC | PRN

## 2017-06-02 MED ORDER — TAMSULOSIN HCL 0.4 MG PO CAPS
0.4000 mg | ORAL_CAPSULE | Freq: Every day | ORAL | Status: DC
  Administered 2017-06-03 – 2017-06-04 (×2): 0.4 mg via ORAL
  Filled 2017-06-02 (×3): qty 1

## 2017-06-02 MED ORDER — DARBEPOETIN ALFA 200 MCG/0.4ML IJ SOSY
PREFILLED_SYRINGE | INTRAMUSCULAR | Status: AC
  Filled 2017-06-02: qty 0.4

## 2017-06-02 MED ORDER — SODIUM CHLORIDE 0.9 % IV BOLUS (SEPSIS): 1000.0000 mL | Freq: Once | INTRAVENOUS | Status: DC

## 2017-06-02 MED ORDER — TRAMADOL HCL 50 MG PO TABS
50.0000 mg | ORAL_TABLET | Freq: Four times a day (QID) | ORAL | Status: DC | PRN
  Administered 2017-06-03 – 2017-06-04 (×2): 50 mg via ORAL
  Filled 2017-06-02 (×2): qty 1

## 2017-06-02 MED ORDER — SODIUM CHLORIDE 0.9 % IV SOLN: Freq: Once | INTRAVENOUS | Status: DC

## 2017-06-02 MED ORDER — TRAMADOL HCL 50 MG PO TABS: 50.0000 mg | ORAL_TABLET | Freq: Three times a day (TID) | ORAL | 2 refills | Status: DC | PRN

## 2017-06-02 MED ORDER — CLOPIDOGREL BISULFATE 75 MG PO TABS
75.0000 mg | ORAL_TABLET | Freq: Every day | ORAL | Status: DC
Start: 2017-06-03 — End: 2017-06-04
  Administered 2017-06-03 – 2017-06-04 (×2): 75 mg via ORAL
  Filled 2017-06-02 (×2): qty 1

## 2017-06-02 MED ORDER — ACETAMINOPHEN 650 MG RE SUPP: 650.0000 mg | Freq: Four times a day (QID) | RECTAL | Status: DC | PRN

## 2017-06-02 MED ORDER — ACETAMINOPHEN 500 MG PO TABS: 1000.0000 mg | ORAL_TABLET | Freq: Four times a day (QID) | ORAL | Status: DC | PRN

## 2017-06-02 MED ORDER — INSULIN ASPART 100 UNIT/ML ~~LOC~~ SOLN
0.0000 [IU] | Freq: Three times a day (TID) | SUBCUTANEOUS | Status: DC
  Administered 2017-06-03 – 2017-06-04 (×2): 2 [IU] via SUBCUTANEOUS
  Administered 2017-06-04: 3 [IU] via SUBCUTANEOUS

## 2017-06-02 MED ORDER — PHENOL 1.4 % MT LIQD
1.0000 | OROMUCOSAL | Status: DC | PRN
  Administered 2017-06-02 – 2017-06-03 (×3): 1 via OROMUCOSAL
  Filled 2017-06-02: qty 177

## 2017-06-02 MED ORDER — ONDANSETRON HCL 4 MG PO TABS: 4.0000 mg | ORAL_TABLET | Freq: Four times a day (QID) | ORAL | Status: DC | PRN

## 2017-06-02 MED ORDER — INSULIN ASPART 100 UNIT/ML ~~LOC~~ SOLN: 0.0000 [IU] | Freq: Every day | SUBCUTANEOUS | Status: DC

## 2017-06-02 MED ORDER — INSULIN GLARGINE 100 UNIT/ML ~~LOC~~ SOLN
15.0000 [IU] | Freq: Every day | SUBCUTANEOUS | Status: DC
  Administered 2017-06-02 – 2017-06-03 (×2): 15 [IU] via SUBCUTANEOUS
  Filled 2017-06-02 (×2): qty 0.15

## 2017-06-02 MED ORDER — VANCOMYCIN HCL IN DEXTROSE 1-5 GM/200ML-% IV SOLN
1000.0000 mg | Freq: Once | INTRAVENOUS | Status: AC
  Administered 2017-06-02: 1000 mg via INTRAVENOUS
  Filled 2017-06-02: qty 200

## 2017-06-02 MED ORDER — ACETAMINOPHEN 325 MG PO TABS
650.0000 mg | ORAL_TABLET | Freq: Once | ORAL | Status: AC | PRN
  Administered 2017-06-02: 650 mg via ORAL
  Filled 2017-06-02: qty 2

## 2017-06-02 MED ORDER — PIPERACILLIN-TAZOBACTAM 3.375 G IVPB 30 MIN
3.3750 g | Freq: Once | INTRAVENOUS | Status: AC
  Administered 2017-06-02: 3.375 g via INTRAVENOUS

## 2017-06-02 MED ORDER — SODIUM CHLORIDE 0.9 % IV SOLN
INTRAVENOUS | Status: AC
  Administered 2017-06-02 – 2017-06-03 (×2): via INTRAVENOUS

## 2017-06-02 NOTE — Patient Instructions (Signed)
Go to [facility].  

## 2017-06-02 NOTE — ED Triage Notes (Addendum)
Pts family reports pt has had general weakness, abd pain and cough since yesterday, also has not been eating or drinking since yesterday, saw pcp today, flu test was negative, sent here for further eval. pts family also reports pt had bright red stool upon arrival to ED. Pt a/ox4, resp e/u, nad. Family also now reports pt has hx of low platelet count that he goes to cancer center for.

## 2017-06-02 NOTE — ED Provider Notes (Signed)
[facility]MENT Provider Note   CSN: [id number] Arrival date & time: 06/02/17  1211     History   Chief Complaint Chief Complaint  Patient presents with  . Weakness  . Cough    HPI Ricardo Watkins is a 83 y.o. male.  HPI 83 year old male who presents with cough, abdominal pain and generalized weakness.  He has a history of myelodysplastic syndrome with history of pancytopenia, coronary artery disease, hypertension, hyperlipidemia, diabetes, chronic diastolic heart failure, and stage IV kidney disease.  History is primarily provided by patient's family.  They report that he began to have productive cough and generalized weakness starting 4 days ago.  States that over the past 2-3 days he has been worse, with extreme fatigue, not eating not drinking.  Has had fevers and chills at home.  Was seen by PCP and tested negative for influenza.  Today in route to the hospital was noted to have an episode of bloody diarrhea.  He complains of generalized abdominal pain with nausea and vomiting this morning.  Denies dysuria or urinary frequency.    Past Medical History:  Diagnosis Date  . Adenomatous colon polyp   . CAD (coronary artery disease)    30% LAD Stenosis, 70% ramus intermedius stenosis, treated with PTCA and angioplasty by Dr Albertine Patricia 2004  . Cataract    right eye  . Cough, persistent 11/04/2015  . Deficiency anemia 04/19/2014  . Diverticulosis   . DM (diabetes mellitus) ([place])   . DVT (deep venous thrombosis) ([place])    secondary to surgery  . Dyslipidemia   . Hyperlipidemia   . Hypertension   . Inguinal hernia    right  . 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 Microcytic anemia 07/07/2015  . 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 Non Hodgkin's lymphoma ([place])   . Pleural effusion, left 11/04/2015  . Prostate CA ([place]) 09/10/2011   Gleason 3+3 R, 3+4 L lobe May 2007 Rx Radioactive seed implants Dr [name]  .  PVD (peripheral vascular disease) ([place])    rt renal artery stent  . Renal insufficiency   . Thrombocytopenia ([place])   . 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 Vitamin D deficiency     Patient Active Problem List   Diagnosis Date Noted  . Flu-like symptoms 06/02/2017  . Elevated lactic acid level 06/02/2017  . CKD (chronic kidney disease), stage IV ([place]) 12/16/2016  . Bilateral carotid artery stenosis 12/11/2016  . Dyslipidemia 12/11/2016  . Anemia in chronic kidney disease 07/01/2016  . Chronic ITP (idiopathic thrombocytopenia) ([facility]) 07/01/2016  . B12 deficiency anemia   . Chest tube in place   . Acute diastolic CHF (congestive heart failure) ([place]) 04/16/2016  . Acute respiratory failure with hypoxia ([place]) 04/14/2016  . Empyema lung ([place])   . Type 2 diabetes mellitus ([name]) 04/04/2016  . Empyema ([place]) 04/03/2016  . Thyroid nodule 03/03/2016  . Sepsis ([place]) 02/17/2016  . Dyspnea 01/27/2016  . Pulmonary edema with congestive heart failure ([place]) 11/12/2015  . Cough, persistent 11/04/2015  . Pleural effusion 11/04/2015  . Anorexia 08/08/2015  . Microcytic anemia 07/07/2015  . Pancytopenia ([place]) 04/08/2015  . Diarrhea 04/08/2015  . Encounter for chemotherapy management 04/02/2015  . Neutropenia ([place]) 02/18/2015  . 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  Deficiency anemia 04/19/2014  . Thrombocytopenia ([place]) 04/19/2014  . Vitamin B12 deficiency 04/19/2014  . Chronic renal failure, stage 3 (moderate) ([place]) 04/19/2014  . Macrocytic anemia 03/20/2013  . Monocytosis 03/20/2013  . Thrombotic stroke ([place]) 09/10/2011  . Prostate CA ([place]) 09/10/2011  . Inguinal hernia   . Colon polyps   . CAD (coronary artery disease) 08/26/2010  . HTN (hypertension) 08/26/2010  . Murmur 08/26/2010  . MURMUR 08/26/2010  . History of lymphoma  08/25/2010  . Diabetes ([place]) 08/25/2010  . DYSLIPIDEMIA 08/25/2010  . THROMBOCYTOPENIA 08/25/2010  . Essential hypertension 08/25/2010  . Coronary atherosclerosis 08/25/2010  . PVD 08/25/2010    Past Surgical History:  Procedure Laterality Date  . APPENDECTOMY     patient ?  Marland Kitchen CARDIAC CATHETERIZATION    . CHEST TUBE INSERTION Left 02/10/2016   Procedure: INSERTION PLEURAL DRAINAGE CATHETER;  Surgeon: [name], MD;  Location: [facility];  Service: Thoracic;  Laterality: Left;  . CHEST TUBE INSERTION Left 04/08/2016   Procedure: CHEST TUBE INSERTION;  Surgeon: [name], MD;  Location: [facility];  Service: Thoracic;  Laterality: Left;  . COLONOSCOPY    . CORONARY ANGIOPLASTY    . CYSTOURETHROSCOPY     ROBOTIC ARM NUCLETRON SEED IMPLANTATION OF PROSTATE  . EXPLORATORY LAPAROTOMY     For evaluation of lymphoma  . REMOVAL OF PLEURAL DRAINAGE CATHETER Left 04/08/2016   Procedure: REMOVAL OF PLEURAL DRAINAGE CATHETER;  Surgeon: [name], MD;  Location: [facility];  Service: Thoracic;  Laterality: Left;       Home Medications    Prior to Admission medications   Medication Sig Start Date End Date Taking? Authorizing Provider  acetaminophen (TYLENOL) 500 MG tablet Take 1,000 mg by mouth every 6 (six) hours as needed for mild pain, moderate pain or headache.     [provider]  clopidogrel (PLAVIX) 75 MG tablet TAKE 1 TABLET BY MOUTH ONCE A DAY WITH BREAKFAST 04/18/17   [name], MD  cyanocobalamin (,VITAMIN B-12,) 1000 MCG/ML injection Inject 1 mL (1,000 mcg total) into the muscle every 30 (thirty) days. 06/29/16   [name], MD  furosemide (LASIX) 40 MG tablet TAKE 1 TABLET BY MOUTH ONCE A DAY 11/01/16   [name], MD  glucose blood test strip Check BS TID 12/06/16   [name], MD  hydrocortisone cream 1 % Apply 1 application topically at bedtime as needed for itching.    [provider]  Insulin Glargine (LANTUS SOLOSTAR) 100  UNIT/ML Solostar Pen INJECT 25 UNITS EVERY NIGHT AT BEDTIME Patient taking differently: 15 Units. INJECT 25 UNITS EVERY NIGHT AT BEDTIME 06/09/16   [name], MD  meclizine (ANTIVERT) 12.5 MG tablet Take 1 tablet (12.5 mg total) by mouth 3 (three) times daily as needed for dizziness. 06/27/15   [name], MD  metoprolol succinate (TOPROL-XL) 100 MG 24 hr tablet TAKE 1 TABLET BY MOUTH ONCE A DAY 01/10/17   [name], MD  Multiple Vitamin (MULTIVITAMIN WITH MINERALS) TABS tablet Take 1 tablet by mouth at bedtime.    [provider]  OVER THE COUNTER MEDICATION Place 1 drop into both eyes daily as needed (dry eyes). Over the counter lubricating eye drop    [provider]  SYRINGE-NEEDLE, DISP, 3 ML (LUER LOCK SAFETY SYRINGES) 25G X 1" 3 ML MISC Use with B 12 injections q month 06/29/16   [name], MD  tamsulosin (FLOMAX) 0.4 MG CAPS  capsule TAKE 1 CAPSULE BY MOUTH ONCE DAILY 07/14/16   [name], MD  traMADol (ULTRAM) 50 MG tablet Take 1 tablet (50 mg total) by mouth every 8 (eight) hours as needed. 06/02/17   Alycia Rossetti, MD    Family History Family History  Problem Relation Age of Onset  . Lymphoma Sister   . Prostate cancer Brother   . Breast cancer Sister   . Diabetes Brother   . Diabetes Sister   . Heart disease Brother   . Asthma Son     Social History Social History   Tobacco Use  . Smoking status: Former Smoker    Packs/day: 0.50    Years: 20.00    Pack years: 10.00    Types: Pipe, Cigarettes    Last attempt to quit: 06/21/1958    Years since quitting: 58.9  . Smokeless tobacco: Never Used  Substance Use Topics  . Alcohol use: No    Alcohol/week: 0.0 oz  . Drug use: No     Allergies   Glucophage [metformin hydrochloride]; Zetia [ezetimibe]; Fenofibrate; and Niacin-lovastatin er   Review of Systems Review of Systems  Constitutional: Positive for appetite change, fatigue and fever.  Respiratory: Positive for  cough.   Cardiovascular: Negative for chest pain.  Gastrointestinal: Positive for abdominal pain, diarrhea, nausea and vomiting.  Genitourinary: Negative for dysuria and frequency.  All other systems reviewed and are negative.    Physical Exam Updated Vital Signs BP (!) 128/58   Pulse 91   Temp 99 F (37.2 C) (Oral)   Resp (!) 24   Ht 5\' 9"  (1.753 m)   Wt 85.7 kg (189 lb)   SpO2 97%   BMI 27.91 kg/m   Physical Exam Physical Exam  Nursing note and vitals reviewed. Constitutional: Ill-appearing, listless, in no acute distress Head: Normocephalic and atraumatic.  Mouth/Throat: Oropharynx is clear and dry mucous membranes.  Neck: Normal range of motion. Neck supple.  Cardiovascular: Tachycardic rate and regular rhythm.   Pulmonary/Chest: Effort normal and breath sounds normal. Coarse breath sounds throughout Abdominal: Soft. There is low abdominal tenderness. There is no rebound and no guarding.  Musculoskeletal: Normal range of motion. No lower extremity edema Neurological: Alert, no facial droop, fluent speech, moves all extremities symmetrically Skin: Skin is warm and dry.  Psychiatric: Cooperative   ED Treatments / Results  Labs (all labs ordered are listed, but only abnormal results are displayed) Labs Reviewed  COMPREHENSIVE METABOLIC PANEL - Abnormal; Notable for the following components:      Result Value   Chloride 99 (*)    Glucose, Bld 286 (*)    Creatinine, Ser 2.16 (*)    Total Protein 8.7 (*)    Albumin 3.4 (*)    GFR calc non Af Amer 26 (*)    GFR calc Af Amer 31 (*)    All other components within normal limits  CBC WITH DIFFERENTIAL/PLATELET - Abnormal; Notable for the following components:   Hemoglobin 10.0 (*)    HCT 34.0 (*)    MCH 23.4 (*)    MCHC 29.4 (*)    RDW 24.6 (*)    Platelets 88 (*)    Monocytes Absolute 3.4 (*)    All other components within normal limits  URINALYSIS, ROUTINE W REFLEX MICROSCOPIC - Abnormal; Notable for the  following components:   APPearance HAZY (*)    Glucose, UA 50 (*)    Protein, ur 100 (*)    Bacteria, UA RARE (*)  Squamous Epithelial / LPF 0-5 (*)    All other components within normal limits  I-STAT CG4 LACTIC ACID, ED - Abnormal; Notable for the following components:   Lactic Acid, Venous 2.55 (*)    All other components within normal limits  CULTURE, BLOOD (ROUTINE X 2)  CULTURE, BLOOD (ROUTINE X 2)  URINE CULTURE  PROTIME-INR  POC OCCULT BLOOD, ED  I-STAT CG4 LACTIC ACID, ED  TYPE AND SCREEN  ABO/RH    EKG  EKG Interpretation  Date/Time:  Thursday June 02 2017 12:20:42 EST Ventricular Rate:  104 PR Interval:  136 QRS Duration: 110 QT Interval:  386 QTC Calculation: 507 R Axis:   -59 Text Interpretation:  Sinus tachycardia with frequent and consecutive Premature ventricular complexes Left anterior fascicular block Left ventricular hypertrophy with repolarization abnormality Abnormal ECG Confirmed by Brantley Stage [phone]) on 06/02/2017 1:12:13 PM       Radiology Ct Abdomen Pelvis Wo Contrast  Result Date: 06/02/2017 CLINICAL DATA:  Abdomen pain EXAM: CT ABDOMEN AND PELVIS WITHOUT CONTRAST TECHNIQUE: Multidetector CT imaging of the abdomen and pelvis was performed following the standard protocol without IV contrast. COMPARISON:  04/17/2016 FINDINGS: Lower chest: Mild bibasilar atelectatic changes are noted. Sub solid nodules are noted in the left lung base. Largest of these measures 9 mm. Mild decreased attenuation is noted in the blood pool on the cardiac chambers which may be related underlying anemia. Hepatobiliary: Liver demonstrates scattered calcifications related to prior granulomatous disease. No mass lesion is seen. The gallbladder is within normal limits. Pancreas: The pancreas is mildly atrophic. Spleen: Normal in size without focal abnormality. Adrenals/Urinary Tract: Bladder is well distended. Kidneys demonstrate tiny nonobstructing stones in the right kidney.  No obstructive changes are seen. Few scattered small hypodensities are noted within both kidneys consistent with cysts. These are stable from previous exams. Stomach/Bowel: Changes of diverticulosis are noted without diverticulitis. Appendix has been surgically removed. Vascular/Lymphatic: Aortic atherosclerosis. Right renal arterial stent is noted. No enlarged abdominal or pelvic lymph nodes. Reproductive: Brachy therapy seeds are noted within the prostate. Other: Bilateral inguinal hernias are noted much larger on the right than the left. No bowel is noted within their fat containing only. Musculoskeletal: Degenerative changes are noted. IMPRESSION: Scattered sub solid nodules in the left lower lobe. The largest of these measures 9 mm. These are likely postinflammatory in nature. Non-contrast chest CT at 3-6 months is recommended. If nodules persist, subsequent management will be based upon the most suspicious nodule(s). This recommendation follows the consensus statement: Guidelines for Management of Incidental Pulmonary Nodules Detected on CT Images: From the Fleischner Society 2017; Radiology 2017; 284:228-243. Diverticulosis without diverticulitis. No acute abnormality is noted. Electronically Signed   By: Inez Catalina M.D.   On: 06/02/2017 14:59   Dg Chest 2 View  Result Date: 06/02/2017 CLINICAL DATA:  Shortness of Breath EXAM: CHEST  2 VIEW COMPARISON:  April 22, 2017 FINDINGS: There is no edema or consolidation. The heart size is upper normal with pulmonary vascularity within normal limits. There is aortic atherosclerosis. No adenopathy. Bones are osteoporotic. IMPRESSION: No edema or consolidation.  There is aortic atherosclerosis. Aortic Atherosclerosis (ICD10-I70.0). Electronically Signed   By: Lowella Grip III M.D.   On: 06/02/2017 14:40    Procedures Procedures (including critical care time)  Medications Ordered in ED Medications  0.9 %  sodium chloride infusion (not administered)    acetaminophen (TYLENOL) tablet 650 mg (650 mg Oral Given 06/02/17 1224)  sodium chloride 0.9 % bolus 1,000  mL (0 mLs Intravenous Stopped 06/02/17 1400)  vancomycin (VANCOCIN) IVPB 1000 mg/200 mL premix (0 mg Intravenous Stopped 06/02/17 1430)  piperacillin-tazobactam (ZOSYN) IVPB 3.375 g (0 g Intravenous Stopped 06/02/17 1400)     Initial Impression / Assessment and Plan / ED Course  I have reviewed the triage vital signs and the nursing notes.  Pertinent labs & imaging results that were available during my care of the patient were reviewed by me and considered in my medical decision making (see chart for details).     83 year old male who presents with weakness, decreased appetite, fever, cough, n/v/d.  Patient listless, ill appearing, dry on exam.  Slightly disoriented but no focal neurological deficits.  Febrile, mildly tachycardic but normotensive no respiratory distress.  Abdomen soft but with some lower abdominal tenderness.  Sepsis workup was pursued.  Without obvious source of infection.  Possible viral. Influenza testing earlier today in the PCPs office was reportedly negative.  Chest x-ray visualized without infiltrate, edema or other acute processes.  UA without obvious signs of infection.  Blood cultures pending.  CT abdomen pelvis was performed.  There is evidence of with no acute intra-abdominal processes.  He does have pulmonary nodules which patient and family are notified about.  Patient was empirically covered with antibiotics, including vancomycin and Zosyn.  After IV fluids he does appear more alert and responsive.  Discussed with Dyanne Carrel who will admit for ongoing management.  Final Clinical Impressions(s) / ED Diagnoses   Final diagnoses:  Generalized weakness  Sepsis, due to unspecified organism [facility])    ED Discharge Orders    None       Forde Dandy, MD 06/02/17 [phone]

## 2017-06-02 NOTE — H&P (Signed)
History and Physical    Ricardo Watkins CBS:[id number] DOB: 1930/08/12 DOA: 06/02/2017  PCP: Ricardo Frizzle, MD Patient coming from: home  Chief Complaint: generalized weakness/ nausea  HPI: Ricardo Watkins is a delightful 83 y.o. male with medical history significant for MDS, stroke, chronic kidney disease hypertension, diabetes, CAD presents to the emergency department from his PCPs office chief complaint 3 day history of decreased oral intake nausea abdominal pain leading to generalized weakness. Initial evaluation reveals acute on chronic kidney disease, elevated lactic acid mild tachycardia in the setting of dehydration.  Information is obtained from the patient and his wife and daughter who at the bedside. Reports 3 days of intermittent abdominal pain decreased oral intake nonproductive cough. Sensation states he experienced cramp-like pain in his lower quadrants that lasted until he had a bowel movement. He reports some nausea and one episode of emesis this morning that did not look like coffee grounds. Wife reports max temperature 100.5. Denies chest palpitations shortness of breath lower extremity edema. He denies headache dizziness syncope or near-syncope. He denies diarrhea constipation melena bright red blood per rectum. He denies dysuria hematuria frequency or urgency. Wife states this morning he was so weak he couldn't bear weight and he "almost onto the ground as his legs gave way". See his primary care provider potassium 4 influenza and was negative. Primary care provider referred him to the hospital for further evaluation.    ED Course:  Temp 100.6 mild tachypnea heart rate 101 he's not hypoxic he is provided with IV fluids vancomycin and Zosyn. At the time of admission he reports feeling better  Review of Systems: As per HPI otherwise all other systems reviewed and are negative.   Ambulatory Status: Ambulates independently with ADLs  Past Medical History:  Diagnosis Date  .  Adenomatous colon polyp   . CAD (coronary artery disease)    30% LAD Stenosis, 70% ramus intermedius stenosis, treated with PTCA and angioplasty by Ricardo Watkins 2004  . Cataract    right eye  . Cough, persistent 11/04/2015  . Deficiency anemia 04/19/2014  . Diverticulosis   . DM (diabetes mellitus) ([place])   . DVT (deep venous thrombosis) ([place])    secondary to surgery  . Dyslipidemia   . Hyperlipidemia   . Hypertension   . Inguinal hernia    right  . 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 Microcytic anemia 07/07/2015  . 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 Non Hodgkin's lymphoma ([place])   . Pleural effusion, left 11/04/2015  . 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 PVD (peripheral vascular disease) ([place])    rt renal artery stent  . Renal insufficiency   . Thrombocytopenia ([place])   . Thrombotic stroke ([place]) 09/10/2011   January 18, 2011 infarct genu & post limb R internal capsule - acute; previous lacunar infarcts/extensive white matter dis  . Vitamin D deficiency     Past Surgical History:  Procedure Laterality Date  . APPENDECTOMY     patient ?  Marland Kitchen CARDIAC CATHETERIZATION    . CHEST TUBE INSERTION Left 02/10/2016   Procedure: INSERTION PLEURAL DRAINAGE CATHETER;  Surgeon: [name], MD;  Location: [place];  Service: Thoracic;  Laterality: Left;  . CHEST TUBE INSERTION Left 04/08/2016   Procedure: CHEST TUBE INSERTION;  Surgeon: [name], MD;  Location: [place];  Service: Thoracic;  Laterality: Left;  . COLONOSCOPY    . CORONARY ANGIOPLASTY    . CYSTOURETHROSCOPY     ROBOTIC ARM NUCLETRON SEED IMPLANTATION OF PROSTATE  . EXPLORATORY LAPAROTOMY     For evaluation of lymphoma  . REMOVAL OF PLEURAL DRAINAGE CATHETER Left 04/08/2016   Procedure: REMOVAL OF PLEURAL DRAINAGE CATHETER;  Surgeon: [name], MD;  Location: [facility] OR;  Service: Thoracic;  Laterality: Left;     Social History   Socioeconomic History  . Marital status: Married    Spouse name: Not on file  . Number of children: 4  . Years of education: Not on file  . Highest education level: Not on file  Social Needs  . Financial resource strain: Not on file  . Food insecurity - worry: Not on file  . Food insecurity - inability: Not on file  . Transportation needs - medical: Not on file  . Transportation needs - non-medical: Not on file  Occupational History  . Occupation: retired  Tobacco Use  . Smoking status: Former Smoker    Packs/day: 0.50    Years: 20.00    Pack years: 10.00    Types: Pipe, Cigarettes    Last attempt to quit: 06/21/1958    Years since quitting: 58.9  . Smokeless tobacco: Never Used  Substance and Sexual Activity  . Alcohol use: No    Alcohol/week: 0.0 oz  . Drug use: No  . Sexual activity: Yes    Partners: Female  Other Topics Concern  . Not on file  Social History Narrative  . Not on file    Allergies  Allergen Reactions  . Glucophage [Metformin Hydrochloride] Other (See Comments)    Chest pain  . Zetia [Ezetimibe] Other (See Comments)    weakness  . Fenofibrate Rash  . Niacin-Lovastatin Er Rash    Family History  Problem Relation Age of Onset  . Lymphoma Sister   . Prostate cancer Brother   . Breast cancer Sister   . Diabetes Brother   . Diabetes Sister   . Heart disease Brother   . Asthma Son     Prior to Admission medications   Medication Sig Start Date End Date Taking? Authorizing Provider  acetaminophen (TYLENOL) 500 MG tablet Take 1,000 mg by mouth every 6 (six) hours as needed for mild pain, moderate pain or headache.    Yes [provider]  clopidogrel (PLAVIX) 75 MG tablet TAKE 1 TABLET BY MOUTH ONCE A DAY WITH BREAKFAST 04/18/17  Yes Ricardo Frizzle, MD  cyanocobalamin (,VITAMIN B-12,) 1000 MCG/ML injection Inject 1 mL (1,000 mcg total) into the muscle every 30 (thirty) days. 06/29/16  Yes Ricardo Frizzle, MD   furosemide (LASIX) 40 MG tablet TAKE 1 TABLET BY MOUTH ONCE A DAY 11/01/16  Yes Ricardo Frizzle, MD  glucose blood test strip Check BS TID 12/06/16  Yes Ricardo Frizzle, MD  hydrocortisone cream 1 % Apply 1 application topically at bedtime as needed for itching.   Yes [provider]  Insulin Glargine (LANTUS SOLOSTAR) 100 UNIT/ML Solostar Pen INJECT 25 UNITS EVERY NIGHT AT BEDTIME Patient taking differently: Inject 22 Units into the skin daily at 10 pm. INJECT 25 UNITS EVERY NIGHT AT BEDTIME 06/09/16  Yes Ricardo Frizzle, MD  meclizine (ANTIVERT) 12.5 MG tablet Take 1 tablet (12.5 mg total) by mouth 3 (three) times daily as needed for dizziness. 06/27/15  Yes Ricardo Frizzle, MD  metoprolol succinate (TOPROL-XL) 100 MG 24  hr tablet TAKE 1 TABLET BY MOUTH ONCE A DAY 01/10/17  Yes Ricardo Frizzle, MD  Multiple Vitamin (MULTIVITAMIN WITH MINERALS) TABS tablet Take 1 tablet by mouth at bedtime.   Yes [provider]  OVER THE COUNTER MEDICATION Place 1 drop into both eyes daily as needed (dry eyes). Over the counter lubricating eye drop   Yes [provider]  SYRINGE-NEEDLE, DISP, 3 ML (LUER LOCK SAFETY SYRINGES) 25G X 1" 3 ML MISC Use with B 12 injections q month 06/29/16  Yes Pickard, Cammie Mcgee, MD  tamsulosin (FLOMAX) 0.4 MG CAPS capsule TAKE 1 CAPSULE BY MOUTH ONCE DAILY 07/14/16  Yes Ricardo Frizzle, MD  traMADol (ULTRAM) 50 MG tablet Take 1 tablet (50 mg total) by mouth every 8 (eight) hours as needed. 06/02/17  Yes Seadrift, Modena Nunnery, MD  vitamin C (ASCORBIC ACID) 500 MG tablet Take 500 mg by mouth daily.   Yes [provider]    Physical Exam: Vitals:   06/02/17 1501 06/02/17 1530 06/02/17 1600 06/02/17 1615  BP: (!) 128/58 (!) 118/53 111/72 120/61  Pulse: 91 77 83 82  Resp: (!) 24 (!) 21 19 (!) 24  Temp: 99 F (37.2 C)     TempSrc: Oral     SpO2: 97% 97% 95% 93%  Weight:      Height:         General:  Appears calm and comfortable slightly  pale in no acute distress. standing at the side of bed with a slightly weak stance Eyes:  PERRL, EOMI, normal lids, iris ENT:  grossly normal hearing, lips & tongue, his membranes of his mouth are pink but dry Neck:  no LAD, masses or thyromegaly Cardiovascular:  RRR, no m/r/g. No LE edema.  Respiratory:  CTA bilaterally, no w/r/r. Normal respiratory effort. Abdomen:  soft, ntnd, is a bowel sounds throughout no guarding or rebounding Skin:  no rash or induration seen on limited exam Musculoskeletal:  grossly normal tone BUE/BLE, good ROM, no bony abnormality Psychiatric:  grossly normal mood and affect, speech fluent and appropriate, AOx3 Neurologic:  CN 2-12 grossly intact, moves all extremities in coordinated fashion, sensation intact alert and oriented speech clear facial symmetry lateral grip 5 out of 5  Labs on Admission: I have personally reviewed following labs and imaging studies  CBC: Recent Labs  Lab 06/02/17 1221  WBC 8.3  NEUTROABS 3.7  HGB 10.0*  HCT 34.0*  MCV 79.4  PLT 88*   Basic Metabolic Panel: Recent Labs  Lab 06/02/17 1221  NA 135  K 4.0  CL 99*  CO2 24  GLUCOSE 286*  BUN 20  CREATININE 2.16*  CALCIUM 8.9   GFR: Estimated Creatinine Clearance: 27.6 mL/min (A) (by C-G formula based on SCr of 2.16 mg/dL (H)). Liver Function Tests: Recent Labs  Lab 06/02/17 1221  AST 23  ALT 20  ALKPHOS 73  BILITOT 0.6  PROT 8.7*  ALBUMIN 3.4*   No results for input(s): LIPASE, AMYLASE in the last 168 hours. No results for input(s): AMMONIA in the last 168 hours. Coagulation Profile: Recent Labs  Lab 06/02/17 1221  INR 1.10   Cardiac Enzymes: No results for input(s): CKTOTAL, CKMB, CKMBINDEX, TROPONINI in the last 168 hours. BNP (last 3 results) No results for input(s): PROBNP in the last 8760 hours. HbA1C: No results for input(s): HGBA1C in the last 72 hours. CBG: Recent Labs  Lab 06/02/17 1618  GLUCAP 289*   Lipid Profile: No results for  input(s):  CHOL, HDL, LDLCALC, TRIG, CHOLHDL, LDLDIRECT in the last 72 hours. Thyroid Function Tests: No results for input(s): TSH, T4TOTAL, FREET4, T3FREE, THYROIDAB in the last 72 hours. Anemia Panel: No results for input(s): VITAMINB12, FOLATE, FERRITIN, TIBC, IRON, RETICCTPCT in the last 72 hours. Urine analysis:    Component Value Date/Time   COLORURINE YELLOW 06/02/2017 1245   APPEARANCEUR HAZY (A) 06/02/2017 1245   LABSPEC 1.016 06/02/2017 1245   PHURINE 5.0 06/02/2017 1245   GLUCOSEU 50 (A) 06/02/2017 1245   HGBUR NEGATIVE 06/02/2017 1245   BILIRUBINUR NEGATIVE 06/02/2017 1245   KETONESUR NEGATIVE 06/02/2017 1245   PROTEINUR 100 (A) 06/02/2017 1245   NITRITE NEGATIVE 06/02/2017 1245   LEUKOCYTESUR NEGATIVE 06/02/2017 1245    Creatinine Clearance: Estimated Creatinine Clearance: 27.6 mL/min (A) (by C-G formula based on SCr of 2.16 mg/dL (H)).  Sepsis Labs: @LABRCNTIP (procalcitonin:4,lacticidven:4) ) Recent Results (from the past 240 hour(s))  TECHNOLOGIST REVIEW     Status: None   Collection Time: 05/26/17  2:33 PM  Result Value Ref Range Status   Technologist Review   Final    few teardrops, mod RBC fragments, Oc large and giant platelet     Radiological Exams on Admission: Ct Abdomen Pelvis Wo Contrast  Result Date: 06/02/2017 CLINICAL DATA:  Abdomen pain EXAM: CT ABDOMEN AND PELVIS WITHOUT CONTRAST TECHNIQUE: Multidetector CT imaging of the abdomen and pelvis was performed following the standard protocol without IV contrast. COMPARISON:  04/17/2016 FINDINGS: Lower chest: Mild bibasilar atelectatic changes are noted. Sub solid nodules are noted in the left lung base. Largest of these measures 9 mm. Mild decreased attenuation is noted in the blood pool on the cardiac chambers which may be related underlying anemia. Hepatobiliary: Liver demonstrates scattered calcifications related to prior granulomatous disease. No mass lesion is seen. The gallbladder is within normal  limits. Pancreas: The pancreas is mildly atrophic. Spleen: Normal in size without focal abnormality. Adrenals/Urinary Tract: Bladder is well distended. Kidneys demonstrate tiny nonobstructing stones in the right kidney. No obstructive changes are seen. Few scattered small hypodensities are noted within both kidneys consistent with cysts. These are stable from previous exams. Stomach/Bowel: Changes of diverticulosis are noted without diverticulitis. Appendix has been surgically removed. Vascular/Lymphatic: Aortic atherosclerosis. Right renal arterial stent is noted. No enlarged abdominal or pelvic lymph nodes. Reproductive: Brachy therapy seeds are noted within the prostate. Other: Bilateral inguinal hernias are noted much larger on the right than the left. No bowel is noted within their fat containing only. Musculoskeletal: Degenerative changes are noted. IMPRESSION: Scattered sub solid nodules in the left lower lobe. The largest of these measures 9 mm. These are likely postinflammatory in nature. Non-contrast chest CT at 3-6 months is recommended. If nodules persist, subsequent management will be based upon the most suspicious nodule(s). This recommendation follows the consensus statement: Guidelines for Management of Incidental Pulmonary Nodules Detected on CT Images: From the Fleischner Society 2017; Radiology 2017; 284:228-243. Diverticulosis without diverticulitis. No acute abnormality is noted. Electronically Signed   By: Inez Catalina M.D.   On: 06/02/2017 14:59   Dg Chest 2 View  Result Date: 06/02/2017 CLINICAL DATA:  Shortness of Breath EXAM: CHEST  2 VIEW COMPARISON:  April 22, 2017 FINDINGS: There is no edema or consolidation. The heart size is upper normal with pulmonary vascularity within normal limits. There is aortic atherosclerosis. No adenopathy. Bones are osteoporotic. IMPRESSION: No edema or consolidation.  There is aortic atherosclerosis. Aortic Atherosclerosis (ICD10-I70.0).  Electronically Signed   By: Lowella Grip III M.D.  On: 06/02/2017 14:40    EKG: Independently reviewed. 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 Acute kidney injury superimposed on chronic kidney disease stage III. Likely related to decreased oral intake in the setting of a likely viral illness. Creatinine 2.16. Appears his baseline is closer 1.5. -Admit to telemetry -Hold nephrotoxins -IV fluids -Monitor urine output -Recheck in the morning  #2. Flulike symptoms/elevated lactic acid/generalized weakness. Lactic acid trending down to 1.3 from 2.5. Likely related to dehydration in the setting of intermittent nausea/abdominal pain. Chest x-ray no edema or consolidation. CT of the abdomen with diverticulosis no diverticulitis. Max temp 100.6 no leukocytosis hypoxic hemodynamically stable. Influenza negative at PCP office today -Gentle IV fluids -Supportive therapy -anti-emetic -PT eval  #3. Diarrhea. Patient with one episode today. Reportedly blood in the toilet. FOBT negative. Upon exam skin excoriation noted perineum from scratching. No leukocytosis. CT of the abdomen noted above. -monitor  #4. Diabetes. Serum glucose 286 on admission -Obtain a hemoglobin A1c -Continue home Lantus at slightly lower dose -Riding scale insulin for optimal control  5.MDS. Appears stable  #6. Hypertension. Blood pressure stable and  on the low end of normal. Not orthostatic. Home medications include Lasix, Toprol -We will hold home medications for now -expect will be able to resume tomorrow -Monitor blood pressure    DVT prophylaxis: lovenox Code Status: limited  Family Communication: wife and daughter  Disposition Plan: home  Consults called: none Admission status:  inpatient Radene Gunning MD [facility]  If 7PM-7AM, please contact night-coverage [email or web address] Password [email or web address]  06/02/2017, 5:07 PM

## 2017-06-02 NOTE — Progress Notes (Signed)
   Subjective:    Patient ID: Ricardo Watkins, male    DOB: 18-Jul-1930, 83 y.o.   MRN: [id number]  Patient presents for Illness (x1 week- fever, muscle aches, productive cough, congestion, nausea)   Pt here with daughter and mother. He has been getting progressively weaker over the past week.  He has had cough with congestion complaint of headache muscle aches abdominal discomfort.  He did have his flu shot.  He was treated for pneumonia the first week of November.  He is followed by the cancer center for MDS. His wife gave him Coricidin as well as Tylenol for the fever.  T-max 100.5.  He is not on any chronic oxygen.  He is not been eating or drinking very well.  He ate a little breakfast yesterday but did not have anything else until this morning where he drank some Ensure.  He still urinating some. He had 2 falls in the past 2 days due to weakness the last was 430 this morning his son did catch him there was no injury noted.   Review Of Systems:  GEN- + fatigue, +fever, weight loss,+weakness, recent illness HEENT- denies eye drainage, change in vision, nasal discharge, CVS- denies chest pain, palpitations RESP- denies SOB, cough, wheeze ABD- denies N/V, change in stools, abd pain GU- denies dysuria, hematuria, dribbling, incontinence MSK- denies joint pain, muscle aches, injury Neuro- denies headache, dizziness, syncope, seizure activity       Objective:    BP 128/70   Pulse 64   Temp 99.2 F (37.3 C) (Oral)   Resp 16   SpO2 98%  GEN- NAD, alert and oriented x3 sitting in wheelchair, weak apperaing  HEENT- PERRL, EOMI, non injected sclera, pink conjunctiva, dry MM oropharynx clear Neck- Supple, no LAD  CVS- RRR, 3/6 SEM RESP- mild rhonchi bilat, no wheeze, normal WOB at rest  ABD-NABS,soft,NT,ND EXT- No edema Pulses- Radial 2+   Flu test negative      Assessment & Plan:      Problem List Items Addressed This Visit      Unprioritized   MDS (myelodysplastic syndrome)  with 5q deletion ([place])    Other Visit Diagnoses    Flu-like symptoms    -  Primary   Flu like symptoms, neg flu test, possible residual PNA as well in setting of MDS. Needs IVF due to weakness dehyration as well as labs, CXR. discussed with family and patient, will send to ER for treatment and fluids. I do not think it is safe for him to be at home in current state.   Relevant Orders   Influenza A and B Ag, Immunoassay (Completed)   Dehydration          Note: This dictation was prepared with Dragon dictation along with smaller phrase technology. Any transcriptional errors that result from this process are unintentional.

## 2017-06-03 ENCOUNTER — Other Ambulatory Visit: Payer: Self-pay

## 2017-06-03 LAB — GLUCOSE, CAPILLARY
GLUCOSE-CAPILLARY: 176 mg/dL — AB (ref 65–99)
Glucose-Capillary: 114 mg/dL — ABNORMAL HIGH (ref 65–99)
Glucose-Capillary: 185 mg/dL — ABNORMAL HIGH (ref 65–99)
Glucose-Capillary: 175 mg/dL — ABNORMAL HIGH (ref 65–99)

## 2017-06-03 LAB — BASIC METABOLIC PANEL
ANION GAP: 9 (ref 5–15)
BUN: 20 mg/dL (ref 6–20)
CALCIUM: 7.9 mg/dL — AB (ref 8.9–10.3)
CO2: 23 mmol/L (ref 22–32)
Chloride: 103 mmol/L (ref 101–111)
Creatinine, Ser: 1.8 mg/dL — ABNORMAL HIGH (ref 0.61–1.24)
GFR, EST AFRICAN AMERICAN: 38 mL/min — AB (ref >60)
GFR, EST NON AFRICAN AMERICAN: 33 mL/min — AB (ref >60)
Glucose, Bld: 142 mg/dL — ABNORMAL HIGH (ref 65–99)
POTASSIUM: 3.3 mmol/L — AB (ref 3.5–5.1)
SODIUM: 135 mmol/L (ref 135–145)

## 2017-06-03 LAB — URINE CULTURE: Culture: NO GROWTH

## 2017-06-03 LAB — CBC
HEMATOCRIT: 34.6 % — AB (ref 39.0–52.0)
HEMOGLOBIN: 10.2 g/dL — AB (ref 13.0–17.0)
MCH: 23.3 pg — ABNORMAL LOW (ref 26.0–34.0)
MCHC: 29.5 g/dL — ABNORMAL LOW (ref 30.0–36.0)
MCV: 79 fL (ref 78.0–100.0)
Platelets: 83 10*3/uL — ABNORMAL LOW (ref 150–400)
RBC: 4.38 MIL/uL (ref 4.22–5.81)
RDW: 24.6 % — ABNORMAL HIGH (ref 11.5–15.5)
WBC: 11.9 10*3/uL — AB (ref 4.0–10.5)

## 2017-06-03 LAB — PATHOLOGIST SMEAR REVIEW

## 2017-06-03 MED ORDER — SALINE SPRAY 0.65 % NA SOLN
1.0000 | NASAL | Status: DC | PRN
  Administered 2017-06-03 (×2): 1 via NASAL
  Filled 2017-06-03: qty 44

## 2017-06-03 MED ORDER — POTASSIUM CHLORIDE CRYS ER 20 MEQ PO TBCR
40.0000 meq | EXTENDED_RELEASE_TABLET | Freq: Once | ORAL | Status: AC
  Administered 2017-06-03: 40 meq via ORAL
  Filled 2017-06-03: qty 2

## 2017-06-03 NOTE — Evaluation (Signed)
Physical Therapy Evaluation Patient Details Name: Ricardo Watkins MRN: [id number] DOB: August 21, 1930 Today's Date: 06/03/2017   History of Present Illness  Ricardo Watkins is a delightful 83 y.o. male with medical history significant for MDS, stroke, chronic kidney disease hypertension, diabetes, CAD presents to the emergency department from his PCPs office chief complaint 3 day history of decreased oral intake nausea abdominal pain leading to generalized weakness. Initial evaluation reveals acute on chronic kidney disease, elevated lactic acid mild tachycardia in the setting of dehydration.  Clinical Impression  Pt admitted with above diagnosis. Pt currently with functional limitations due to the deficits listed below (see PT Problem List). Pt was able to ambulate in hall with RW with min assist and cues for safety. Very responsive family that can assist pt on d/c.  Will follow acutely.  Pt will benefit from skilled PT to increase their independence and safety with mobility to allow discharge to the venue listed below.      Follow Up Recommendations Home health PT;Supervision/Assistance - 24 hour    Equipment Recommendations  None recommended by PT    Recommendations for Other Services       Precautions / Restrictions Precautions Precautions: Fall Precaution Comments: suprapubic catheter Restrictions Weight Bearing Restrictions: No      Mobility  Bed Mobility Overal bed mobility: Needs Assistance Bed Mobility: Supine to Sit     Supine to sit: Mod assist     General bed mobility comments: needed assist to elevate trunk with pt reaching to pull up on PT.   Transfers Overall transfer level: Needs assistance Equipment used: Rolling walker (2 wheeled) Transfers: Sit to/from Stand Sit to Stand: Min assist         General transfer comment: Assist to power up and steadying once up.   Ambulation/Gait Ambulation/Gait assistance: Min assist Ambulation Distance (Feet): 110  Feet Assistive device: Rolling walker (2 wheeled) Gait Pattern/deviations: Step-through pattern;Decreased stride length;Decreased step length - right;Decreased step length - left;Drifts right/left;Trunk flexed;Wide base of support   Gait velocity interpretation: Below normal speed for age/gender General Gait Details: Pt needed cues to stay close to RW as he tends to stay behind RW.  Does well with cues but needed them constantly as he continued to let RW get away from him unless cues.  Cued to stand tall as well.  Fatigue does make posture worsen.  Needed  cues for safety throughtout.    Stairs            Wheelchair Mobility    Modified Rankin (Stroke Patients Only)       Balance Overall balance assessment: Needs assistance;History of Falls Sitting-balance support: No upper extremity supported;Feet supported Sitting balance-Leahy Scale: Fair     Standing balance support: Bilateral upper extremity supported;During functional activity Standing balance-Leahy Scale: Poor Standing balance comment: relies on UE support                             Pertinent Vitals/Pain Pain Assessment: No/denies pain  VSS  Home Living Family/patient expects to be discharged to:: Private residence Living Arrangements: Spouse/significant other Available Help at Discharge: Family;Available 24 hours/day(wife and son live with pt and daughters live close by) Type of Home: House Home Access: Ramped entrance     Home Layout: One level Home Equipment: Wheelchair - [employer] - 2 wheels;Tub bench;Cane - single point      Prior Function Level of Independence: Independent with assistive device(s);Needs assistance  Gait / Transfers Assistance Needed: used cane PTA per wife  ADL's / Homemaking Assistance Needed: Wife assist with bathing and dressing        Hand Dominance   Dominant Hand: Right    Extremity/Trunk Assessment   Upper Extremity Assessment Upper Extremity  Assessment: Defer to OT evaluation    Lower Extremity Assessment Lower Extremity Assessment: Generalized weakness    Cervical / Trunk Assessment Cervical / Trunk Assessment: Kyphotic  Communication   Communication: No difficulties  Cognition Arousal/Alertness: Awake/alert Behavior During Therapy: WFL for tasks assessed/performed Overall Cognitive Status: Within Functional Limits for tasks assessed                                        General Comments      Exercises General Exercises - Lower Extremity Ankle Circles/Pumps: AROM;Both;10 reps;Supine Long Arc Quad: AROM;Both;10 reps;Seated   Assessment/Plan    PT Assessment Patient needs continued PT services  PT Problem List Decreased activity tolerance;Decreased balance;Decreased mobility;Decreased knowledge of use of DME;Decreased safety awareness;Decreased knowledge of precautions;Decreased strength       PT Treatment Interventions DME instruction;Gait training;Functional mobility training;Therapeutic activities;Therapeutic exercise;Balance training;Patient/family education    PT Goals (Current goals can be found in the Care Plan section)  Acute Rehab PT Goals Patient Stated Goal: to get better PT Goal Formulation: With patient Time For Goal Achievement: 06/17/17 Potential to Achieve Goals: Good    Frequency Min 3X/week   Barriers to discharge        Co-evaluation               AM-PAC PT "6 Clicks" Daily Activity  Outcome Measure Difficulty turning over in bed (including adjusting bedclothes, sheets and blankets)?: Unable Difficulty moving from lying on back to sitting on the side of the bed? : Unable Difficulty sitting down on and standing up from a chair with arms (e.g., wheelchair, bedside commode, etc,.)?: A Little Help needed moving to and from a bed to chair (including a wheelchair)?: A Little Help needed walking in hospital room?: A Little Help needed climbing 3-5 steps with a  railing? : Total 6 Click Score: 12    End of Session Equipment Utilized During Treatment: Gait belt Activity Tolerance: Patient limited by fatigue Patient left: in chair;with chair alarm set;with family/visitor present Nurse Communication: Mobility status PT Visit Diagnosis: Muscle weakness (generalized) (M62.81);Unsteadiness on feet (R26.81)    Time: 3428-7681 PT Time Calculation (min) (ACUTE ONLY): 19 min   Charges:   PT Evaluation $PT Eval Moderate Complexity: 1 Mod     PT G Codes:        Doris Gruhn,PT Acute Rehabilitation [phone] (pager)   Denice Paradise 06/03/2017, 1:55 PM

## 2017-06-03 NOTE — Progress Notes (Signed)
PROGRESS NOTE    ZEKIAH CARUTH  QQP:[id number] DOB: 1931/02/21 DOA: 06/02/2017 PCP: [name], MD Brief Narrative:83 y.o. male with medical history significant for MDS, stroke, chronic kidney disease hypertension, diabetes, CAD presents to the emergency department from his PCPs office chief complaint 3 day history of decreased oral intake nausea abdominal pain leading to generalized weakness. Initial evaluation reveals acute on chronic kidney disease, elevated lactic acid mild tachycardia in the setting of dehydration.  Information is obtained from the patient and his wife and daughter who at the bedside. Reports 3 days of intermittent abdominal pain decreased oral intake nonproductive cough. Sensation states he experienced cramp-like pain in his lower quadrants that lasted until he had a bowel movement. He reports some nausea and one episode of emesis this morning that did not look like coffee grounds. Wife reports max temperature 100.5. Denies chest palpitations shortness of breath lower extremity edema. He denies headache dizziness syncope or near-syncope. He denies diarrhea constipation melena bright red blood per rectum. He denies dysuria hematuria frequency or urgency. Wife states this morning he was so weak he couldn't bear weight and he "almost onto the ground as his legs gave way". See his primary care provider potassium 4 influenza and was negative. Primary care provider referred him to the hospital for further evaluation.  ED Course:  Temp 100.6 mild tachypnea heart rate 101 he's not hypoxic he is provided with IV fluids vancomycin and Zosyn. At the time of admission he reports feeling better     Assessment & Plan:   Principal Problem:   Acute kidney injury superimposed on chronic kidney disease ([place]) Active Problems:   HTN (hypertension)   Diabetes ([place])   Thrombocytopenia ([place])   MDS (myelodysplastic syndrome) with 5q deletion ([facility])   Pancytopenia ([facility])   Diarrhea  Chronic ITP (idiopathic thrombocytopenia) ([facility])   CKD (chronic kidney disease), stage IV ([facility])   Flu-like symptoms   Elevated lactic acid level  1. Acute kidney injury superimposed on chronic kidney disease stage III. Likely related to decreased oral intake in the setting of a likely viral illness. Creatinine down to 1.8 from 2.1.-Admit to telemetry -Hold nephrotoxins -IV fluids -Monitor urine output -Recheck in the morning  #2. Flulike symptoms/elevated lactic acid/generalized weakness. Lactic acid trending down to 1.3 from 2.5. Likely related to dehydration in the setting of intermittent nausea/abdominal pain. Chest x-ray no edema or consolidation. CT of the abdomen with diverticulosis no diverticulitis. Max temp 100.6 no leukocytosis hypoxic hemodynamically stable. Influenza negative at PCP office today -Gentle IV fluids -Supportive therapy -anti-emetic -PT eval  #3. Diarrhea. Patient with one episode today. Reportedly blood in the toilet. FOBT negative. Upon exam skin excoriation noted perineum from scratching. No leukocytosis. CT of the abdomen noted above. -monitor  #4. Diabetes. Serum glucose 286 on admission -Obtain a hemoglobin A1c -Continue home Lantus at slightly lower dose -Riding scale insulin for optimal control  5.MDS. Appears stable  #6. Hypertension. Blood pressure stable and on the low end of normal. Not orthostatic. Home medications include Lasix, Toprol -We will hold home medications for now -expect will be able to resume tomorrow -Monitor blood pressure      DVT prophylaxis:lovenox Code Status:partial Disposition Plan:home with pt. plan discharge tomorrow as long as his renal functions continues to improve.DC home with PT patient lives with his family. Consultants: None none  Procedures:  Antimicrobials: None Subjective: Resting in bed denies any new complaints no nausea vomiting diarrhea chest pain shortness of breath or abdominal  pain.   Objective: Resting in bed in no acute distress Vitals:   06/02/17 1900 06/02/17 1951 06/03/17 0502 06/03/17 1306  BP: (!) 143/56 (!) 141/73 (!) 128/51 (!) 133/54  Pulse: 97 98 (!) 102 97  Resp: (!) 26 (!) 25 (!) 25 (!) 22  Temp:  99.2 F (37.3 C) (!) 100.9 F (38.3 C) 98.9 F (37.2 C)  TempSrc:  Oral Oral Oral  SpO2: 95% 96% 97% 100%  Weight:  84.2 kg (185 lb 10 oz) 84.2 kg (185 lb 10 oz)   Height:        Intake/Output Summary (Last 24 hours) at 06/03/2017 1402 Last data filed at 06/03/2017 1300 Gross per 24 hour  Intake 680 ml  Output 650 ml  Net 30 ml   Filed Weights   06/02/17 1247 06/02/17 1951 06/03/17 0502  Weight: 85.7 kg (189 lb) 84.2 kg (185 lb 10 oz) 84.2 kg (185 lb 10 oz)    Examination:  General exam: Appears calm and comfortable  Respiratory system: Clear to auscultation. Respiratory effort normal. Cardiovascular system: S1 & S2 heard, RRR. No JVD, murmurs, rubs, gallops or clicks. No pedal edema. Gastrointestinal system: Abdomen is nondistended, soft and nontender. No organomegaly or masses felt. Normal bowel sounds heard. Central nervous system: Alert and oriented. No focal neurological deficits. Extremities: Symmetric 5 x 5 power. Skin: No rashes, lesions or ulcers Psychiatry: Judgement and insight appear normal. Mood & affect appropriate.     Data Reviewed: I have personally reviewed following labs and imaging studies  CBC: Recent Labs  Lab 06/02/17 1221 06/03/17 0242  WBC 8.3 11.9*  NEUTROABS 3.7  --   HGB 10.0* 10.2*  HCT 34.0* 34.6*  MCV 79.4 79.0  PLT 88* 83*   Basic Metabolic Panel: Recent Labs  Lab 06/02/17 1221 06/03/17 0242  NA 135 135  K 4.0 3.3*  CL 99* 103  CO2 24 23  GLUCOSE 286* 142*  BUN 20 20  CREATININE 2.16* 1.80*  CALCIUM 8.9 7.9*   GFR: Estimated Creatinine Clearance: 30.5 mL/min (A) (by C-G formula based on SCr of 1.8 mg/dL (H)). Liver Function Tests: Recent Labs  Lab 06/02/17 1221  AST 23  ALT  20  ALKPHOS 73  BILITOT 0.6  PROT 8.7*  ALBUMIN 3.4*   No results for input(s): LIPASE, AMYLASE in the last 168 hours. No results for input(s): AMMONIA in the last 168 hours. Coagulation Profile: Recent Labs  Lab 06/02/17 1221  INR 1.10   Cardiac Enzymes: No results for input(s): CKTOTAL, CKMB, CKMBINDEX, TROPONINI in the last 168 hours. BNP (last 3 results) No results for input(s): PROBNP in the last 8760 hours. HbA1C: No results for input(s): HGBA1C in the last 72 hours. CBG: Recent Labs  Lab 06/02/17 1618 06/02/17 2111 06/03/17 0809 06/03/17 1208  GLUCAP 289* 168* 114* 176*   Lipid Profile: No results for input(s): CHOL, HDL, LDLCALC, TRIG, CHOLHDL, LDLDIRECT in the last 72 hours. Thyroid Function Tests: Recent Labs    06/02/17 1730  TSH 1.992   Anemia Panel: No results for input(s): VITAMINB12, FOLATE, FERRITIN, TIBC, IRON, RETICCTPCT in the last 72 hours. Sepsis Labs: Recent Labs  Lab 06/02/17 1300 06/02/17 1422  LATICACIDVEN 2.55* 1.39    Recent Results (from the past 240 hour(s))  TECHNOLOGIST REVIEW     Status: None   Collection Time: 05/26/17  2:33 PM  Result Value Ref Range Status   Technologist Review   Final    few teardrops, mod RBC fragments, Oc  large and giant platelet  Culture, blood (Routine x 2)     Status: None (Preliminary result)   Collection Time: 06/02/17 12:45 PM  Result Value Ref Range Status   Specimen Description BLOOD LEFT FOREARM  Final   Special Requests   Final    BOTTLES DRAWN AEROBIC AND ANAEROBIC Blood Culture adequate volume   Culture NO GROWTH < 24 HOURS  Final   Report Status PENDING  Incomplete  Culture, blood (Routine x 2)     Status: None (Preliminary result)   Collection Time: 06/02/17 12:45 PM  Result Value Ref Range Status   Specimen Description BLOOD RIGHT ANTECUBITAL  Final   Special Requests   Final    BOTTLES DRAWN AEROBIC AND ANAEROBIC Blood Culture adequate volume   Culture NO GROWTH < 24 HOURS  Final    Report Status PENDING  Incomplete         Radiology Studies: Ct Abdomen Pelvis Wo Contrast  Result Date: 06/02/2017 CLINICAL DATA:  Abdomen pain EXAM: CT ABDOMEN AND PELVIS WITHOUT CONTRAST TECHNIQUE: Multidetector CT imaging of the abdomen and pelvis was performed following the standard protocol without IV contrast. COMPARISON:  04/17/2016 FINDINGS: Lower chest: Mild bibasilar atelectatic changes are noted. Sub solid nodules are noted in the left lung base. Largest of these measures 9 mm. Mild decreased attenuation is noted in the blood pool on the cardiac chambers which may be related underlying anemia. Hepatobiliary: Liver demonstrates scattered calcifications related to prior granulomatous disease. No mass lesion is seen. The gallbladder is within normal limits. Pancreas: The pancreas is mildly atrophic. Spleen: Normal in size without focal abnormality. Adrenals/Urinary Tract: Bladder is well distended. Kidneys demonstrate tiny nonobstructing stones in the right kidney. No obstructive changes are seen. Few scattered small hypodensities are noted within both kidneys consistent with cysts. These are stable from previous exams. Stomach/Bowel: Changes of diverticulosis are noted without diverticulitis. Appendix has been surgically removed. Vascular/Lymphatic: Aortic atherosclerosis. Right renal arterial stent is noted. No enlarged abdominal or pelvic lymph nodes. Reproductive: Brachy therapy seeds are noted within the prostate. Other: Bilateral inguinal hernias are noted much larger on the right than the left. No bowel is noted within their fat containing only. Musculoskeletal: Degenerative changes are noted. IMPRESSION: Scattered sub solid nodules in the left lower lobe. The largest of these measures 9 mm. These are likely postinflammatory in nature. Non-contrast chest CT at 3-6 months is recommended. If nodules persist, subsequent management will be based upon the most suspicious nodule(s). This  recommendation follows the consensus statement: Guidelines for Management of Incidental Pulmonary Nodules Detected on CT Images: From the Fleischner Society 2017; Radiology 2017; 284:228-243. Diverticulosis without diverticulitis. No acute abnormality is noted. Electronically Signed   By: Inez Catalina M.D.   On: 06/02/2017 14:59   Dg Chest 2 View  Result Date: 06/02/2017 CLINICAL DATA:  Shortness of Breath EXAM: CHEST  2 VIEW COMPARISON:  April 22, 2017 FINDINGS: There is no edema or consolidation. The heart size is upper normal with pulmonary vascularity within normal limits. There is aortic atherosclerosis. No adenopathy. Bones are osteoporotic. IMPRESSION: No edema or consolidation.  There is aortic atherosclerosis. Aortic Atherosclerosis (ICD10-I70.0). Electronically Signed   By: Lowella Grip III M.D.   On: 06/02/2017 14:40        Scheduled Meds: . clopidogrel  75 mg Oral Daily  . insulin aspart  0-5 Units Subcutaneous QHS  . insulin aspart  0-9 Units Subcutaneous TID WC  . insulin glargine  15 Units Subcutaneous  QHS  . tamsulosin  0.4 mg Oral Daily   Continuous Infusions: . sodium chloride    . sodium chloride 75 mL/hr at 06/02/17 1639     LOS: 1 day      Georgette Shell, MD [facility]  If 7PM-7AM, please contact night-coverage [email or web address] Password [email or web address] 06/03/2017, 2:02 PM

## 2017-06-03 NOTE — Care Management Note (Signed)
Case Management Note  Patient Details  Name: Ricardo Watkins MRN: [id number] Date of Birth: 03-Jun-1931  Subjective/Objective:      Admitted with AKI. From home with wife.             [address] (Spouse) Wallis Mart (Daughter)    [phone]      PCP: Jenna Luo  Action/Plan: Transition to home when medically stable.  Expected Discharge Date:                  Expected Discharge Plan:  [place]  In-House Referral:     Discharge planning Services  CM Consult  Post Acute Care Choice:    Choice offered to:  Spouse  DME Arranged:    DME Agency:     HH Arranged:  PT HH Agency:  [facility], pending MD order. CM has requested order.  Status of Service:  Completed, signed off  If discussed at [place] of Stay Meetings, dates discussed:    Additional Comments:  Sharin Mons, RN 06/03/2017, 4:28 PM

## 2017-06-03 NOTE — Progress Notes (Signed)
Patient alert/oriented x3. Patient admitted for sepsis. Patient was incontinent of bladder on admission, bath given and sacral border applied to prevent breakdown of sacrum. Near anus small skin tear applied foam dressing. Family at bedside clothing placed in closet. Admission completed and education provided to patient and family.

## 2017-06-04 DIAGNOSIS — E1129 Type 2 diabetes mellitus with other diabetic kidney complication: Secondary | ICD-10-CM

## 2017-06-04 DIAGNOSIS — Z794 Long term (current) use of insulin: Secondary | ICD-10-CM

## 2017-06-04 DIAGNOSIS — D46C Myelodysplastic syndrome with isolated del(5q) chromosomal abnormality: Secondary | ICD-10-CM

## 2017-06-04 DIAGNOSIS — N189 Chronic kidney disease, unspecified: Secondary | ICD-10-CM

## 2017-06-04 DIAGNOSIS — N179 Acute kidney failure, unspecified: Principal | ICD-10-CM

## 2017-06-04 DIAGNOSIS — N184 Chronic kidney disease, stage 4 (severe): Secondary | ICD-10-CM

## 2017-06-04 DIAGNOSIS — R7989 Other specified abnormal findings of blood chemistry: Secondary | ICD-10-CM

## 2017-06-04 DIAGNOSIS — R531 Weakness: Secondary | ICD-10-CM

## 2017-06-04 DIAGNOSIS — R6889 Other general symptoms and signs: Secondary | ICD-10-CM

## 2017-06-04 DIAGNOSIS — I10 Essential (primary) hypertension: Secondary | ICD-10-CM

## 2017-06-04 LAB — BASIC METABOLIC PANEL
Anion gap: 7 (ref 5–15)
BUN: 18 mg/dL (ref 6–20)
CALCIUM: 7.9 mg/dL — AB (ref 8.9–10.3)
CO2: 24 mmol/L (ref 22–32)
CREATININE: 1.67 mg/dL — AB (ref 0.61–1.24)
Chloride: 106 mmol/L (ref 101–111)
GFR, EST AFRICAN AMERICAN: 42 mL/min — AB (ref >60)
GFR, EST NON AFRICAN AMERICAN: 36 mL/min — AB (ref >60)
Glucose, Bld: 167 mg/dL — ABNORMAL HIGH (ref 65–99)
Potassium: 3.9 mmol/L (ref 3.5–5.1)
SODIUM: 137 mmol/L (ref 135–145)

## 2017-06-04 LAB — HEMOGLOBIN A1C
HEMOGLOBIN A1C: 10.2 % — AB (ref 4.8–5.6)
Mean Plasma Glucose: 246 mg/dL

## 2017-06-04 LAB — GLUCOSE, CAPILLARY
GLUCOSE-CAPILLARY: 152 mg/dL — AB (ref 65–99)
GLUCOSE-CAPILLARY: 248 mg/dL — AB (ref 65–99)

## 2017-06-04 NOTE — Discharge Summary (Signed)
Discharge Summary  Ricardo Watkins:[id number] DOB: Jun 06, 1931  PCP: [name], MD  Admit date: 06/02/2017 Discharge date: 06/04/2017  Time spent: >34mins  Recommendations for Outpatient Follow-up:  1. PCP  Discharge Diagnoses:  Active Hospital Problems   Diagnosis Date Noted  . Acute kidney injury superimposed on chronic kidney disease ([place]) 06/02/2017  . Flu-like symptoms 06/02/2017  . Elevated lactic acid level 06/02/2017  . CKD (chronic kidney disease), stage IV ([place]) 12/16/2016  . Chronic ITP (idiopathic thrombocytopenia) (HCC) 07/01/2016  . Diarrhea 04/08/2015  . Pancytopenia ([place]) 04/08/2015  . MDS (myelodysplastic syndrome) with 5q deletion ([place]) 11/20/2014  . Thrombocytopenia ([place]) 04/19/2014  . HTN (hypertension) 08/26/2010  . Diabetes ([place]) 08/25/2010    Resolved Hospital Problems  No resolved problems to display.    Discharge Condition: Stable  Diet recommendation: Heart healthy  Vitals:   06/03/17 2108 06/04/17 0515  BP: 138/77 (!) 146/88  Pulse: 98 92  Resp: (!) 25 (!) 22  Temp: 98.7 F (37.1 C) (!) 97.5 F (36.4 C)  SpO2: 95% 98%    History of present illness:  83 y.o.malewith medical history significantfor MDS, stroke, chronic kidney disease hypertension, diabetes, CAD presents to the emergency department from his PCPs office chief complaint 3 day history of decreased oral intake nausea abdominal pain leading to generalized weakness. Initial evaluation reveals acute on chronic kidney disease,elevated lactic acid mild tachycardia in the setting of dehydration. Information is obtained from the patient and his wife and daughter who at the bedside. Reports 3 days of intermittent abdominal pain decreased oral intake nonproductive cough. Sensation states he experienced cramp-like pain in his lower quadrants that lasted until he had a bowel movement. He reports some nausea and one episode of emesis this morning that did not look like coffee  grounds. Wife reports max temperature 100.5. Denies chest palpitations shortness of breath lower extremity edema.He denies headache dizziness syncope or near-syncope. He denies diarrhea constipation melena bright red blood per rectum. He denies dysuria hematuria frequency or urgency. Wife states this morning he was so weak he couldn't bear weight and he "almost onto the ground as his legs gave way".See his primary care provider potassium 4 influenza and was negative. Primary care provider referred him to the hospital for further evaluation.  Today, pt noted to be feeling much better, denies any symptoms. Stable for discharge   Hospital Course:  Principal Problem:   Acute kidney injury superimposed on chronic kidney disease ([place]) Active Problems:   HTN (hypertension)   Diabetes ([place])   Thrombocytopenia ([place])   MDS (myelodysplastic syndrome) with 5q deletion (HCC)   Pancytopenia (HCC)   Diarrhea   Chronic ITP (idiopathic thrombocytopenia) (HCC)   CKD (chronic kidney disease), stage IV (HCC)   Flu-like symptoms   Elevated lactic acid level  1. Acute kidney injury superimposed on chronic kidney disease stage III. Resolved  Likely related to decreased oral intake in the setting of a likely viral illness Encourage adequate hydration  #2. Flulike symptoms/elevated lactic acid/generalized weakness. Resolved  Lactic acid trending down to 1.3 from 2.5. Likely related to dehydration in the setting of intermittent nausea/abdominal pain. Chest x-ray no edema or consolidation. CT of the abdomen with diverticulosis no diverticulitis. Max temp 100.6no leukocytosis hypoxic hemodynamically stable. Influenza negative at PCP office -Home PT  #3. Diarrhea. Resolved FOBT negative.Upon exam skin excoriation noted perineum from scratching.No leukocytosis. CT of the abdomen noted above.  #4. Diabetes. Serum glucose 286 on admission -hemoglobin A1c 10.2 -Continue home  insulin regimen -Advised to  follow up with PCP, with close monitoring  5.MDS. Appears stable  #6. Hypertension. Blood pressure stable Continue home meds    Procedures:  None   Consultations:  None  Discharge Exam: BP (!) 146/88 (BP Location: Left Arm)   Pulse 92   Temp (!) 97.5 F (36.4 C) (Oral)   Resp (!) 22   Ht 5\' 9"  (1.753 m)   Wt 89.6 kg (197 lb 8.5 oz)   SpO2 98%   BMI 29.17 kg/m   General: Alert, awake, in no distress Cardiovascular: S1-S2 present, no added hrt sound Respiratory: Chest clear bilaterally   Discharge Instructions You were cared for by a hospitalist during your hospital stay. If you have any questions about your discharge medications or the care you received while you were in the hospital after you are discharged, you can call the unit and asked to speak with the hospitalist on call if the hospitalist that took care of you is not available. Once you are discharged, your primary care physician will handle any further medical issues. Please note that NO REFILLS for any discharge medications will be authorized once you are discharged, as it is imperative that you return to your primary care physician (or establish a relationship with a primary care physician if you do not have one) for your aftercare needs so that they can reassess your need for medications and monitor your lab values.  Discharge Instructions    Diet - low sodium heart healthy   Complete by:  As directed    Increase activity slowly   Complete by:  As directed      Allergies as of 06/04/2017      Reactions   Glucophage [metformin Hydrochloride] Other (See Comments)   Chest pain   Zetia [ezetimibe] Other (See Comments)   weakness   Fenofibrate Rash   Niacin-lovastatin Er Rash      Medication List    TAKE these medications   acetaminophen 500 MG tablet Commonly known as:  TYLENOL Take 1,000 mg by mouth every 6 (six) hours as needed for mild pain, moderate pain or headache.   clopidogrel 75 MG  tablet Commonly known as:  PLAVIX TAKE 1 TABLET BY MOUTH ONCE A DAY WITH BREAKFAST   cyanocobalamin 1000 MCG/ML injection Commonly known as:  (VITAMIN B-12) Inject 1 mL (1,000 mcg total) into the muscle every 30 (thirty) days.   furosemide 40 MG tablet Commonly known as:  LASIX TAKE 1 TABLET BY MOUTH ONCE A DAY   glucose blood test strip Check BS TID   hydrocortisone cream 1 % Apply 1 application topically at bedtime as needed for itching.   Insulin Glargine 100 UNIT/ML Solostar Pen Commonly known as:  LANTUS SOLOSTAR INJECT 25 UNITS EVERY NIGHT AT BEDTIME What changed:    how much to take  how to take this  when to take this  additional instructions   meclizine 12.5 MG tablet Commonly known as:  ANTIVERT Take 1 tablet (12.5 mg total) by mouth 3 (three) times daily as needed for dizziness.   metoprolol succinate 100 MG 24 hr tablet Commonly known as:  TOPROL-XL TAKE 1 TABLET BY MOUTH ONCE A DAY   multivitamin with minerals Tabs tablet Take 1 tablet by mouth at bedtime.   OVER THE COUNTER MEDICATION Place 1 drop into both eyes daily as needed (dry eyes). Over the counter lubricating eye drop   SYRINGE-NEEDLE (DISP) 3 ML 25G X 1" 3 ML Misc Commonly  known as:  LUER LOCK SAFETY SYRINGES Use with B 12 injections q month   tamsulosin 0.4 MG Caps capsule Commonly known as:  FLOMAX TAKE 1 CAPSULE BY MOUTH ONCE DAILY   traMADol 50 MG tablet Commonly known as:  ULTRAM Take 1 tablet (50 mg total) by mouth every 8 (eight) hours as needed.   vitamin C 500 MG tablet Commonly known as:  ASCORBIC ACID Take 500 mg by mouth daily.      Allergies  Allergen Reactions  . Glucophage [Metformin Hydrochloride] Other (See Comments)    Chest pain  . Zetia [Ezetimibe] Other (See Comments)    weakness  . Fenofibrate Rash  . Niacin-Lovastatin Er Rash   Follow-up Information    [name], MD. Schedule an appointment as soon as possible for a visit in 1 week(s).     Specialty:  Family Medicine Contact information: [address] [place]  [address] [phone]            The results of significant diagnostics from this hospitalization (including imaging, microbiology, ancillary and laboratory) are listed below for reference.    Significant Diagnostic Studies: Ct Abdomen Pelvis Wo Contrast  Result Date: 06/02/2017 CLINICAL DATA:  Abdomen pain EXAM: CT ABDOMEN AND PELVIS WITHOUT CONTRAST TECHNIQUE: Multidetector CT imaging of the abdomen and pelvis was performed following the standard protocol without IV contrast. COMPARISON:  04/17/2016 FINDINGS: Lower chest: Mild bibasilar atelectatic changes are noted. Sub solid nodules are noted in the left lung base. Largest of these measures 9 mm. Mild decreased attenuation is noted in the blood pool on the cardiac chambers which may be related underlying anemia. Hepatobiliary: Liver demonstrates scattered calcifications related to prior granulomatous disease. No mass lesion is seen. The gallbladder is within normal limits. Pancreas: The pancreas is mildly atrophic. Spleen: Normal in size without focal abnormality. Adrenals/Urinary Tract: Bladder is well distended. Kidneys demonstrate tiny nonobstructing stones in the right kidney. No obstructive changes are seen. Few scattered small hypodensities are noted within both kidneys consistent with cysts. These are stable from previous exams. Stomach/Bowel: Changes of diverticulosis are noted without diverticulitis. Appendix has been surgically removed. Vascular/Lymphatic: Aortic atherosclerosis. Right renal arterial stent is noted. No enlarged abdominal or pelvic lymph nodes. Reproductive: Brachy therapy seeds are noted within the prostate. Other: Bilateral inguinal hernias are noted much larger on the right than the left. No bowel is noted within their fat containing only. Musculoskeletal: Degenerative changes are noted. IMPRESSION: Scattered sub solid nodules in  the left lower lobe. The largest of these measures 9 mm. These are likely postinflammatory in nature. Non-contrast chest CT at 3-6 months is recommended. If nodules persist, subsequent management will be based upon the most suspicious nodule(s). This recommendation follows the consensus statement: Guidelines for Management of Incidental Pulmonary Nodules Detected on CT Images: From the Fleischner Society 2017; Radiology 2017; 284:228-243. Diverticulosis without diverticulitis. No acute abnormality is noted. Electronically Signed   By: Inez Catalina M.D.   On: 06/02/2017 14:59   Dg Chest 2 View  Result Date: 06/02/2017 CLINICAL DATA:  Shortness of Breath EXAM: CHEST  2 VIEW COMPARISON:  April 22, 2017 FINDINGS: There is no edema or consolidation. The heart size is upper normal with pulmonary vascularity within normal limits. There is aortic atherosclerosis. No adenopathy. Bones are osteoporotic. IMPRESSION: No edema or consolidation.  There is aortic atherosclerosis. Aortic Atherosclerosis (ICD10-I70.0). Electronically Signed   By: Lowella Grip III M.D.   On: 06/02/2017 14:40  Microbiology: Recent Results (from the past 240 hour(s))  TECHNOLOGIST REVIEW     Status: None   Collection Time: 05/26/17  2:33 PM  Result Value Ref Range Status   Technologist Review   Final    few teardrops, mod RBC fragments, Oc large and giant platelet  Culture, blood (Routine x 2)     Status: None (Preliminary result)   Collection Time: 06/02/17 12:45 PM  Result Value Ref Range Status   Specimen Description BLOOD LEFT FOREARM  Final   Special Requests   Final    BOTTLES DRAWN AEROBIC AND ANAEROBIC Blood Culture adequate volume   Culture NO GROWTH 2 DAYS  Final   Report Status PENDING  Incomplete  Culture, blood (Routine x 2)     Status: None (Preliminary result)   Collection Time: 06/02/17 12:45 PM  Result Value Ref Range Status   Specimen Description BLOOD RIGHT ANTECUBITAL  Final   Special Requests    Final    BOTTLES DRAWN AEROBIC AND ANAEROBIC Blood Culture adequate volume   Culture NO GROWTH 2 DAYS  Final   Report Status PENDING  Incomplete  Urine culture     Status: None   Collection Time: 06/02/17  3:20 PM  Result Value Ref Range Status   Specimen Description URINE, CLEAN CATCH  Final   Special Requests NONE  Final   Culture NO GROWTH  Final   Report Status 06/03/2017 FINAL  Final     Labs: Basic Metabolic Panel: Recent Labs  Lab 06/02/17 1221 06/03/17 0242 06/04/17 0407  NA 135 135 137  K 4.0 3.3* 3.9  CL 99* 103 106  CO2 24 23 24   GLUCOSE 286* 142* 167*  BUN 20 20 18   CREATININE 2.16* 1.80* 1.67*  CALCIUM 8.9 7.9* 7.9*   Liver Function Tests: Recent Labs  Lab 06/02/17 1221  AST 23  ALT 20  ALKPHOS 73  BILITOT 0.6  PROT 8.7*  ALBUMIN 3.4*   No results for input(s): LIPASE, AMYLASE in the last 168 hours. No results for input(s): AMMONIA in the last 168 hours. CBC: Recent Labs  Lab 06/02/17 1221 06/03/17 0242  WBC 8.3 11.9*  NEUTROABS 3.7  --   HGB 10.0* 10.2*  HCT 34.0* 34.6*  MCV 79.4 79.0  PLT 88* 83*   Cardiac Enzymes: No results for input(s): CKTOTAL, CKMB, CKMBINDEX, TROPONINI in the last 168 hours. BNP: BNP (last 3 results) Recent Labs    06/16/16 1330  BNP 457.5*    ProBNP (last 3 results) No results for input(s): PROBNP in the last 8760 hours.  CBG: Recent Labs  Lab 06/03/17 1208 06/03/17 1637 06/03/17 2106 06/04/17 0810 06/04/17 1159  GLUCAP 176* 185* 175* 152* 248*       Signed:  Alma Friendly, MD [facility] 06/04/2017, 8:33 PM

## 2017-06-04 NOTE — Progress Notes (Signed)
NURSING PROGRESS NOTE  Ricardo Watkins [id number] Discharge Data: 06/04/2017 1:46 PM Attending Provider: Alma Friendly, MD SMM:[id number], Cammie Mcgee, MD   [name] to be D/C'd Home per MD order.     All IV's will be discontinued and monitored for bleeding.  All belongings will be returned to patient for patient to take home.  Last Documented Vital Signs:  Blood pressure (!) 146/88, pulse 92, temperature (!) 97.5 F (36.4 C), temperature source Oral, resp. rate (!) 22, height 5\' 9"  (1.753 m), weight 89.6 kg (197 lb 8.5 oz), SpO2 98 %.   Discharge instruction and Discharge papers given   [facility] RN

## 2017-06-06 ENCOUNTER — Telehealth: Payer: Self-pay | Admitting: *Deleted

## 2017-06-06 ENCOUNTER — Other Ambulatory Visit (HOSPITAL_BASED_OUTPATIENT_CLINIC_OR_DEPARTMENT_OTHER): Payer: Medicare Other

## 2017-06-06 ENCOUNTER — Ambulatory Visit (HOSPITAL_BASED_OUTPATIENT_CLINIC_OR_DEPARTMENT_OTHER): Payer: Medicare Other

## 2017-06-06 VITALS — BP 149/82 | HR 81 | Temp 97.6°F | Resp 20

## 2017-06-06 DIAGNOSIS — D631 Anemia in chronic kidney disease: Secondary | ICD-10-CM

## 2017-06-06 DIAGNOSIS — N183 Chronic kidney disease, stage 3 unspecified: Secondary | ICD-10-CM

## 2017-06-06 DIAGNOSIS — D696 Thrombocytopenia, unspecified: Secondary | ICD-10-CM

## 2017-06-06 DIAGNOSIS — D539 Nutritional anemia, unspecified: Secondary | ICD-10-CM

## 2017-06-06 DIAGNOSIS — D46C Myelodysplastic syndrome with isolated del(5q) chromosomal abnormality: Secondary | ICD-10-CM | POA: Diagnosis not present

## 2017-06-06 DIAGNOSIS — D693 Immune thrombocytopenic purpura: Secondary | ICD-10-CM | POA: Diagnosis present

## 2017-06-06 DIAGNOSIS — E538 Deficiency of other specified B group vitamins: Secondary | ICD-10-CM | POA: Diagnosis not present

## 2017-06-06 LAB — CBC WITH DIFFERENTIAL/PLATELET
BASO%: 0.9 % (ref 0.0–2.0)
Basophils Absolute: 0.1 10*3/uL (ref 0.0–0.1)
EOS ABS: 0 10*3/uL (ref 0.0–0.5)
EOS%: 0 % (ref 0.0–7.0)
HEMATOCRIT: 34.2 % — AB (ref 38.4–49.9)
HGB: 10.1 g/dL — ABNORMAL LOW (ref 13.0–17.1)
LYMPH#: 2.4 10*3/uL (ref 0.9–3.3)
LYMPH%: 27.8 % (ref 14.0–49.0)
MCH: 23.5 pg — ABNORMAL LOW (ref 27.2–33.4)
MCHC: 29.5 g/dL — AB (ref 32.0–36.0)
MCV: 79.7 fL (ref 79.3–98.0)
MONO#: 1.6 10*3/uL — AB (ref 0.1–0.9)
MONO%: 18.7 % — ABNORMAL HIGH (ref 0.0–14.0)
NEUT%: 52.6 % (ref 39.0–75.0)
NEUTROS ABS: 4.5 10*3/uL (ref 1.5–6.5)
NRBC: 0 % (ref 0–0)
RBC: 4.29 10*6/uL (ref 4.20–5.82)
RDW: 24.5 % — AB (ref 11.0–14.6)
WBC: 8.6 10*3/uL (ref 4.0–10.3)

## 2017-06-06 LAB — TECHNOLOGIST REVIEW

## 2017-06-06 MED ORDER — DARBEPOETIN ALFA 200 MCG/0.4ML IJ SOSY
PREFILLED_SYRINGE | INTRAMUSCULAR | Status: AC
  Filled 2017-06-06: qty 0.4

## 2017-06-06 MED ORDER — ROMIPLOSTIM INJECTION 500 MCG
2.8000 ug/kg | SUBCUTANEOUS | Status: DC
  Administered 2017-06-06: 250 ug via SUBCUTANEOUS
  Filled 2017-06-06: qty 0.5

## 2017-06-06 NOTE — Patient Instructions (Signed)
Romiplostim injection What is this medicine? ROMIPLOSTIM (roe mi PLOE stim) helps your body make more platelets. This medicine is used to treat low platelets caused by chronic idiopathic thrombocytopenic purpura (ITP). This medicine may be used for other purposes; ask your health care provider or pharmacist if you have questions. COMMON BRAND NAME(S): Nplate What should I tell my health care provider before I take this medicine? They need to know if you have any of these conditions: -cancer or myelodysplastic syndrome -low blood counts, like low white cell, platelet, or red cell counts -take medicines that treat or prevent blood clots -an unusual or allergic reaction to romiplostim, mannitol, other medicines, foods, dyes, or preservatives -pregnant or trying to get pregnant -breast-feeding How should I use this medicine? This medicine is for injection under the skin. It is given by a health care professional in a hospital or clinic setting. A special MedGuide will be given to you before your injection. Read this information carefully each time. Talk to your pediatrician regarding the use of this medicine in children. Special care may be needed. Overdosage: If you think you have taken too much of this medicine contact a poison control center or emergency room at once. NOTE: This medicine is only for you. Do not share this medicine with others. What if I miss a dose? It is important not to miss your dose. Call your doctor or health care professional if you are unable to keep an appointment. What may interact with this medicine? Interactions are not expected. This list may not describe all possible interactions. Give your health care provider a list of all the medicines, herbs, non-prescription drugs, or dietary supplements you use. Also tell them if you smoke, drink alcohol, or use illegal drugs. Some items may interact with your medicine. What should I watch for while using this  medicine? Your condition will be monitored carefully while you are receiving this medicine. Visit your prescriber or health care professional for regular checks on your progress and for the needed blood tests. It is important to keep all appointments. What side effects may I notice from receiving this medicine? Side effects that you should report to your doctor or health care professional as soon as possible: -allergic reactions like skin rash, itching or hives, swelling of the face, lips, or tongue -shortness of breath, chest pain, swelling in a leg -unusual bleeding or bruising Side effects that usually do not require medical attention (report to your doctor or health care professional if they continue or are bothersome): -dizziness -headache -muscle aches -pain in arms and legs -stomach pain -trouble sleeping This list may not describe all possible side effects. Call your doctor for medical advice about side effects. You may report side effects to FDA at 1-800-FDA-1088. Where should I keep my medicine? This drug is given in a hospital or clinic and will not be stored at home. NOTE: This sheet is a summary. It may not cover all possible information. If you have questions about this medicine, talk to your doctor, pharmacist, or health care provider.  2018 Elsevier/Gold Standard (2008-02-05 15:13:04)  

## 2017-06-06 NOTE — Telephone Encounter (Signed)
Daughter called- patient has been discharged. Needs to know what to do about missed Nplate injection.  Dr [name] wants him to come today if possible and we will cancel Thursday's appt. Will return on 12/27. Verbalized understanding.  Message to scheduler.

## 2017-06-07 LAB — CULTURE, BLOOD (ROUTINE X 2)
Culture: NO GROWTH
Culture: NO GROWTH
SPECIAL REQUESTS: ADEQUATE
Special Requests: ADEQUATE

## 2017-06-07 NOTE — Consult Note (Signed)
            [facility] CM Primary Care Navigator  06/07/2017  Ricardo Watkins 12/27/1930 [id number]   Attempt to seepatient at the bedsideto identify possible discharge needs buthe was alreadydischargedper staff report.   Patient was discharged homeover the weekend with home health services.  Primary care provider's officeis listed asprovidingtransition of care (TOC).  Per chart review, patient has discharge instruction to schedule an appointment with primary care provider as soon as possible for a visit in 1 week (need close monitoring of diabetes for A1C of 10.2).   For additional questions please contact:  Edwena Felty A. Dariann Huckaba, BSN, RN-BC [facility] PRIMARY CARE Navigator Cell: [phone]

## 2017-06-08 ENCOUNTER — Telehealth: Payer: Self-pay | Admitting: Family Medicine

## 2017-06-08 DIAGNOSIS — C44329 Squamous cell carcinoma of skin of other parts of face: Secondary | ICD-10-CM | POA: Diagnosis not present

## 2017-06-08 DIAGNOSIS — Z85828 Personal history of other malignant neoplasm of skin: Secondary | ICD-10-CM | POA: Diagnosis not present

## 2017-06-08 NOTE — Telephone Encounter (Signed)
After being dc'd from [facility] - they sent out [facility] for PT and received a call from them stating that the pt did not think he needed their services. So they will no longer be going out to help him. [facility] ordered this)

## 2017-06-09 ENCOUNTER — Ambulatory Visit: Payer: Medicare Other

## 2017-06-09 ENCOUNTER — Other Ambulatory Visit: Payer: Medicare Other

## 2017-06-10 ENCOUNTER — Other Ambulatory Visit: Payer: Self-pay | Admitting: Cardiology

## 2017-06-10 ENCOUNTER — Telehealth: Payer: Self-pay | Admitting: *Deleted

## 2017-06-10 DIAGNOSIS — I6523 Occlusion and stenosis of bilateral carotid arteries: Secondary | ICD-10-CM

## 2017-06-10 NOTE — Telephone Encounter (Signed)
Pt aware of her carotid doppler Carotid ordered placed to get done in 1 year

## 2017-06-10 NOTE — Telephone Encounter (Signed)
-----   Message from [name], MD sent at 06/02/2017  9:44 PM EST ----- Right ICA of a 1-39% stenosis. Left ICA of a 60-79% stenosis. Follow up in one year.   Call Mr. [place] with the results and send results to [name], MD

## 2017-06-13 ENCOUNTER — Encounter: Payer: Self-pay | Admitting: Family Medicine

## 2017-06-13 ENCOUNTER — Ambulatory Visit (INDEPENDENT_AMBULATORY_CARE_PROVIDER_SITE_OTHER): Payer: Medicare Other | Admitting: Family Medicine

## 2017-06-13 VITALS — BP 122/60 | HR 70 | Temp 98.0°F | Resp 16 | Ht 69.0 in | Wt 186.0 lb

## 2017-06-13 DIAGNOSIS — E119 Type 2 diabetes mellitus without complications: Secondary | ICD-10-CM

## 2017-06-13 DIAGNOSIS — R918 Other nonspecific abnormal finding of lung field: Secondary | ICD-10-CM

## 2017-06-13 DIAGNOSIS — I6523 Occlusion and stenosis of bilateral carotid arteries: Secondary | ICD-10-CM | POA: Diagnosis not present

## 2017-06-13 DIAGNOSIS — R911 Solitary pulmonary nodule: Secondary | ICD-10-CM | POA: Diagnosis not present

## 2017-06-13 DIAGNOSIS — E860 Dehydration: Secondary | ICD-10-CM

## 2017-06-13 DIAGNOSIS — Z09 Encounter for follow-up examination after completed treatment for conditions other than malignant neoplasm: Secondary | ICD-10-CM | POA: Diagnosis not present

## 2017-06-13 DIAGNOSIS — Z794 Long term (current) use of insulin: Secondary | ICD-10-CM | POA: Diagnosis not present

## 2017-06-13 DIAGNOSIS — N184 Chronic kidney disease, stage 4 (severe): Secondary | ICD-10-CM | POA: Diagnosis not present

## 2017-06-13 DIAGNOSIS — IMO0001 Reserved for inherently not codable concepts without codable children: Secondary | ICD-10-CM

## 2017-06-13 MED ORDER — METOCLOPRAMIDE HCL 10 MG PO TABS: 10.0000 mg | ORAL_TABLET | Freq: Four times a day (QID) | ORAL | 0 refills | Status: DC

## 2017-06-13 NOTE — Progress Notes (Signed)
Subjective:    Patient ID: Ricardo Watkins, male    DOB: [birth year], 83 y.o.   MRN: [id number]  HPI Patient was recently admitted to the hospital with low-grade fever, and dehydration presumably due to a virus. He was administered IV fluids in the hospital and discharged having improved dramatically. During the workup for fever, a coincidental finding was made of a 9 mm left lower lobe pulmonary nodule. Recommendations were for a follow-up CT scan in 3-6 months. Patient would like me to schedule this at this time for 6 months. His blood sugars are between 180 and 280 most days. He is currently taking 22 units of Lantus in the morning. He denies any hypoglycemia. His wife is concerned because he will frequently have episodes of nausea. He was selling become nauseated and experienced vomiting for no reason. This is isolated and occurs 2-3 times every month and seems to be self-limited. It is concerning for possible gastroparesis given his long-standing history of insulin-dependent diabetes mellitus. Today he denies any dizziness, chest pain, shortness of breath, fevers, nausea vomiting or diarrhea Past Medical History:  Diagnosis Date  . Adenomatous colon polyp   . CAD (coronary artery disease)    30% LAD Stenosis, 70% ramus intermedius stenosis, treated with PTCA and angioplasty by Dr Albertine Patricia 2004  . Cataract    right eye  . Cough, persistent 11/04/2015  . Deficiency anemia 04/19/2014  . Diverticulosis   . DM (diabetes mellitus) ([place])   . DVT (deep venous thrombosis) ([place])    secondary to surgery  . Dyslipidemia   . Hyperlipidemia   . Hypertension   . Inguinal hernia    right  . 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 Microcytic anemia 07/07/2015  . 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 Non Hodgkin's lymphoma ([place])   . Pleural effusion, left 11/04/2015  . 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 PVD (peripheral vascular disease) ([place])    rt renal artery stent  . Renal insufficiency   . Thrombocytopenia ([place])   . Thrombotic stroke ([place]) 09/10/2011   January 18, 2011 infarct genu & post limb R internal capsule - acute; previous lacunar infarcts/extensive white matter dis  . Vitamin D deficiency    Past Surgical History:  Procedure Laterality Date  . APPENDECTOMY     patient ?  Marland Kitchen CARDIAC CATHETERIZATION    . CHEST TUBE INSERTION Left 02/10/2016   Procedure: INSERTION PLEURAL DRAINAGE CATHETER;  Surgeon: [name], MD;  Location: [place];  Service: Thoracic;  Laterality: Left;  . CHEST TUBE INSERTION Left 04/08/2016   Procedure: CHEST TUBE INSERTION;  Surgeon: [name], MD;  Location: [place];  Service: Thoracic;  Laterality: Left;  . COLONOSCOPY    . CORONARY ANGIOPLASTY    . CYSTOURETHROSCOPY     ROBOTIC ARM NUCLETRON SEED IMPLANTATION OF PROSTATE  . EXPLORATORY LAPAROTOMY     For evaluation of lymphoma  . REMOVAL OF PLEURAL DRAINAGE CATHETER Left 04/08/2016   Procedure: REMOVAL OF PLEURAL DRAINAGE CATHETER;  Surgeon: [name], MD;  Location[facility];  Service: Thoracic;  Laterality: Left;   Current Outpatient Medications on File Prior to Visit  Medication Sig Dispense Refill  . acetaminophen (TYLENOL) 500 MG tablet Take 1,000 mg by mouth every 6 (six) hours as needed for mild pain, moderate pain or headache.     Marland Kitchen  clopidogrel (PLAVIX) 75 MG tablet TAKE 1 TABLET BY MOUTH ONCE A DAY WITH BREAKFAST 90 tablet 3  . cyanocobalamin (,VITAMIN B-12,) 1000 MCG/ML injection Inject 1 mL (1,000 mcg total) into the muscle every 30 (thirty) days. 10 mL 3  . furosemide (LASIX) 40 MG tablet TAKE 1 TABLET BY MOUTH ONCE A DAY 90 tablet 3  . glucose blood test strip Check BS TID 300 each 3  . hydrocortisone cream 1 % Apply 1 application topically at bedtime as needed for itching.    . Insulin Glargine (LANTUS SOLOSTAR) 100 UNIT/ML Solostar Pen INJECT 25 UNITS EVERY NIGHT AT  BEDTIME (Patient taking differently: Inject 22 Units into the skin daily at 10 pm. INJECT 25 UNITS EVERY NIGHT AT BEDTIME) 5 pen 3  . meclizine (ANTIVERT) 12.5 MG tablet Take 1 tablet (12.5 mg total) by mouth 3 (three) times daily as needed for dizziness. 30 tablet 0  . metoprolol succinate (TOPROL-XL) 100 MG 24 hr tablet TAKE 1 TABLET BY MOUTH ONCE A DAY 90 tablet 3  . Multiple Vitamin (MULTIVITAMIN WITH MINERALS) TABS tablet Take 1 tablet by mouth at bedtime.    Marland Kitchen OVER THE COUNTER MEDICATION Place 1 drop into both eyes daily as needed (dry eyes). Over the counter lubricating eye drop    . SYRINGE-NEEDLE, DISP, 3 ML (LUER LOCK SAFETY SYRINGES) 25G X 1" 3 ML MISC Use with B 12 injections q month 100 each 3  . tamsulosin (FLOMAX) 0.4 MG CAPS capsule TAKE 1 CAPSULE BY MOUTH ONCE DAILY 90 capsule 3  . traMADol (ULTRAM) 50 MG tablet Take 1 tablet (50 mg total) by mouth every 8 (eight) hours as needed. 30 tablet 2  . vitamin C (ASCORBIC ACID) 500 MG tablet Take 500 mg by mouth daily.     Current Facility-Administered Medications on File Prior to Visit  Medication Dose Route Frequency Provider Last Rate Last Dose  . cyanocobalamin ((VITAMIN B-12)) injection 1,000 mcg  1,000 mcg Subcutaneous Q30 days [name], MD   1,000 mcg at 07/06/16 1430  . romiPLOStim (NPLATE) injection 170 mcg  170 mcg Subcutaneous Weekly [name], Ni, MD   170 mcg at 11/25/16 1345   Allergies  Allergen Reactions  . Glucophage [Metformin Hydrochloride] Other (See Comments)    Chest pain  . Zetia [Ezetimibe] Other (See Comments)    weakness  . Fenofibrate Rash  . Niacin-Lovastatin Er Rash   Social History   Socioeconomic History  . Marital status: Married    Spouse name: Not on file  . Number of children: 4  . Years of education: Not on file  . Highest education level: Not on file  Social Needs  . Financial resource strain: Not on file  . Food insecurity - worry: Not on file  . Food insecurity - inability:  Not on file  . Transportation needs - medical: Not on file  . Transportation needs - non-medical: Not on file  Occupational History  . Occupation: retired  Tobacco Use  . Smoking status: Former Smoker    Packs/day: 0.50    Years: 20.00    Pack years: 10.00    Types: Pipe, Cigarettes    Last attempt to quit: 06/21/1958    Years since quitting: 59.0  . Smokeless tobacco: Never Used  Substance and Sexual Activity  . Alcohol use: No    Alcohol/week: 0.0 oz  . Drug use: No  . Sexual activity: Yes    Partners: Female  Other Topics Concern  . Not  on file  Social History Narrative  . Not on file      Review of Systems  All other systems reviewed and are negative.      Objective:   Physical Exam  Cardiovascular: Normal rate, regular rhythm and normal heart sounds.  No murmur heard. Pulmonary/Chest: Effort normal and breath sounds normal. No respiratory distress. He has no wheezes. He has no rales.  Abdominal: Soft. Bowel sounds are normal. He exhibits no distension. There is no tenderness. There is no rebound and no guarding.  Musculoskeletal: He exhibits no edema.  Vitals reviewed.         Assessment & Plan:  CKD (chronic kidney disease) stage 4, GFR 15-29 ml/min ([place])  Dehydration  Hospital discharge follow-up  IDDM (insulin dependent diabetes mellitus) ([place])  Pulmonary nodule  Abnormal findings on diagnostic imaging of lung - Plan: CT CHEST LUNG CA SCREEN LOW DOSE W/O CM  Regarding the coincidental finding of a pulmonary nodule in the left lower lobe, I will schedule CT scan without contrast because of his chronic kidney disease in 6 months. His dehydration appears resolved. I am concerned about possible gastroparesis as a source of his intermittent nausea and vomiting. I will try the patient empirically on Reglan 10 mg by mouth every 6 hours when necessary. He will use this medicine sparingly only as needed for nausea and vomiting. His diabetes does not seem  well-controlled. Will increase his Lantus to 30 units in the morning and recheck fasting blood sugars and two-hour postprandial sugars in one week. Tentatively I believe he likely needs an insulin dose between 40 and 50 units a day to control this.

## 2017-06-15 ENCOUNTER — Other Ambulatory Visit: Payer: Self-pay

## 2017-06-15 DIAGNOSIS — D696 Thrombocytopenia, unspecified: Secondary | ICD-10-CM

## 2017-06-15 NOTE — Progress Notes (Signed)
Cbc

## 2017-06-16 ENCOUNTER — Other Ambulatory Visit (HOSPITAL_BASED_OUTPATIENT_CLINIC_OR_DEPARTMENT_OTHER): Payer: Medicare Other

## 2017-06-16 ENCOUNTER — Ambulatory Visit (HOSPITAL_BASED_OUTPATIENT_CLINIC_OR_DEPARTMENT_OTHER): Payer: Medicare Other

## 2017-06-16 VITALS — BP 141/59 | HR 75 | Temp 97.5°F | Resp 16

## 2017-06-16 DIAGNOSIS — L570 Actinic keratosis: Secondary | ICD-10-CM | POA: Diagnosis not present

## 2017-06-16 DIAGNOSIS — D46C Myelodysplastic syndrome with isolated del(5q) chromosomal abnormality: Secondary | ICD-10-CM

## 2017-06-16 DIAGNOSIS — D696 Thrombocytopenia, unspecified: Secondary | ICD-10-CM

## 2017-06-16 DIAGNOSIS — D631 Anemia in chronic kidney disease: Secondary | ICD-10-CM

## 2017-06-16 DIAGNOSIS — D539 Nutritional anemia, unspecified: Secondary | ICD-10-CM

## 2017-06-16 DIAGNOSIS — D693 Immune thrombocytopenic purpura: Secondary | ICD-10-CM

## 2017-06-16 DIAGNOSIS — N183 Chronic kidney disease, stage 3 unspecified: Secondary | ICD-10-CM

## 2017-06-16 DIAGNOSIS — Z4802 Encounter for removal of sutures: Secondary | ICD-10-CM | POA: Diagnosis not present

## 2017-06-16 LAB — CBC WITH DIFFERENTIAL/PLATELET
BASO%: 4.3 % — AB (ref 0.0–2.0)
BASOS ABS: 0.2 10*3/uL — AB (ref 0.0–0.1)
EOS%: 0.3 % (ref 0.0–7.0)
Eosinophils Absolute: 0 10*3/uL (ref 0.0–0.5)
HEMATOCRIT: 33.5 % — AB (ref 38.4–49.9)
HEMOGLOBIN: 10.4 g/dL — AB (ref 13.0–17.1)
LYMPH#: 2.8 10*3/uL (ref 0.9–3.3)
LYMPH%: 58.1 % — ABNORMAL HIGH (ref 14.0–49.0)
MCH: 24.3 pg — AB (ref 27.2–33.4)
MCHC: 31.2 g/dL — ABNORMAL LOW (ref 32.0–36.0)
MCV: 77.7 fL — ABNORMAL LOW (ref 79.3–98.0)
MONO#: 0.5 10*3/uL (ref 0.1–0.9)
MONO%: 10.6 % (ref 0.0–14.0)
NEUT#: 1.3 10*3/uL — ABNORMAL LOW (ref 1.5–6.5)
NEUT%: 26.7 % — ABNORMAL LOW (ref 39.0–75.0)
RBC: 4.31 10*6/uL (ref 4.20–5.82)
RDW: 25.7 % — AB (ref 11.0–14.6)
WBC: 4.8 10*3/uL (ref 4.0–10.3)

## 2017-06-16 MED ORDER — ROMIPLOSTIM 250 MCG ~~LOC~~ SOLR
250.0000 ug | SUBCUTANEOUS | Status: DC
  Administered 2017-06-16: 250 ug via SUBCUTANEOUS
  Filled 2017-06-16: qty 0.5

## 2017-06-17 ENCOUNTER — Other Ambulatory Visit: Payer: Self-pay | Admitting: *Deleted

## 2017-06-17 ENCOUNTER — Other Ambulatory Visit: Payer: Self-pay | Admitting: Family Medicine

## 2017-06-17 DIAGNOSIS — R918 Other nonspecific abnormal finding of lung field: Secondary | ICD-10-CM

## 2017-06-22 ENCOUNTER — Other Ambulatory Visit: Payer: Self-pay | Admitting: Hematology and Oncology

## 2017-06-22 DIAGNOSIS — D46C Myelodysplastic syndrome with isolated del(5q) chromosomal abnormality: Secondary | ICD-10-CM

## 2017-06-23 ENCOUNTER — Other Ambulatory Visit (HOSPITAL_BASED_OUTPATIENT_CLINIC_OR_DEPARTMENT_OTHER): Payer: Medicare Other

## 2017-06-23 ENCOUNTER — Other Ambulatory Visit: Payer: Self-pay | Admitting: Hematology and Oncology

## 2017-06-23 ENCOUNTER — Ambulatory Visit (HOSPITAL_BASED_OUTPATIENT_CLINIC_OR_DEPARTMENT_OTHER): Payer: Medicare Other

## 2017-06-23 DIAGNOSIS — N183 Chronic kidney disease, stage 3 unspecified: Secondary | ICD-10-CM

## 2017-06-23 DIAGNOSIS — E538 Deficiency of other specified B group vitamins: Secondary | ICD-10-CM

## 2017-06-23 DIAGNOSIS — D693 Immune thrombocytopenic purpura: Secondary | ICD-10-CM | POA: Diagnosis present

## 2017-06-23 DIAGNOSIS — D46C Myelodysplastic syndrome with isolated del(5q) chromosomal abnormality: Secondary | ICD-10-CM

## 2017-06-23 DIAGNOSIS — D631 Anemia in chronic kidney disease: Secondary | ICD-10-CM

## 2017-06-23 DIAGNOSIS — D696 Thrombocytopenia, unspecified: Secondary | ICD-10-CM

## 2017-06-23 DIAGNOSIS — D539 Nutritional anemia, unspecified: Secondary | ICD-10-CM

## 2017-06-23 LAB — CBC WITH DIFFERENTIAL/PLATELET
BASO%: 0.2 % (ref 0.0–2.0)
BASOS ABS: 0 10*3/uL (ref 0.0–0.1)
EOS ABS: 0 10*3/uL (ref 0.0–0.5)
EOS%: 0.4 % (ref 0.0–7.0)
HCT: 37.7 % — ABNORMAL LOW (ref 38.4–49.9)
HEMOGLOBIN: 11.1 g/dL — AB (ref 13.0–17.1)
LYMPH#: 2.5 10*3/uL (ref 0.9–3.3)
LYMPH%: 53 % — ABNORMAL HIGH (ref 14.0–49.0)
MCH: 24.1 pg — ABNORMAL LOW (ref 27.2–33.4)
MCHC: 29.4 g/dL — ABNORMAL LOW (ref 32.0–36.0)
MCV: 82 fL (ref 79.3–98.0)
MONO#: 0.6 10*3/uL (ref 0.1–0.9)
MONO%: 11.9 % (ref 0.0–14.0)
NEUT#: 1.6 10*3/uL (ref 1.5–6.5)
NEUT%: 34.5 % — ABNORMAL LOW (ref 39.0–75.0)
NRBC: 0 % (ref 0–0)
PLATELETS: 85 10*3/uL — AB (ref 140–400)
RBC: 4.6 10*6/uL (ref 4.20–5.82)
RDW: 24.5 % — AB (ref 11.0–14.6)
WBC: 4.6 10*3/uL (ref 4.0–10.3)

## 2017-06-23 LAB — TECHNOLOGIST REVIEW

## 2017-06-23 MED ORDER — ROMIPLOSTIM 250 MCG ~~LOC~~ SOLR
250.0000 ug | SUBCUTANEOUS | Status: DC
  Administered 2017-06-23: 250 ug via SUBCUTANEOUS
  Filled 2017-06-23: qty 0.5

## 2017-06-23 MED ORDER — DARBEPOETIN ALFA 200 MCG/0.4ML IJ SOSY
PREFILLED_SYRINGE | INTRAMUSCULAR | Status: AC
  Filled 2017-06-23: qty 0.4

## 2017-06-23 MED ORDER — CYANOCOBALAMIN 1000 MCG/ML IJ SOLN
1000.0000 ug | Freq: Once | INTRAMUSCULAR | Status: AC
  Administered 2017-06-23: 1000 ug via INTRAMUSCULAR

## 2017-06-24 LAB — VITAMIN B12

## 2017-06-28 ENCOUNTER — Ambulatory Visit
Admission: RE | Admit: 2017-06-28 | Discharge: 2017-06-28 | Disposition: A | Discharge status: Home / Self Care | Payer: Medicare Other | Source: Ambulatory Visit | Attending: Family Medicine | Admitting: Family Medicine

## 2017-06-28 DIAGNOSIS — R918 Other nonspecific abnormal finding of lung field: Secondary | ICD-10-CM

## 2017-06-28 DIAGNOSIS — R911 Solitary pulmonary nodule: Secondary | ICD-10-CM | POA: Diagnosis not present

## 2017-06-29 ENCOUNTER — Ambulatory Visit (HOSPITAL_COMMUNITY): Admission: RE | Admit: 2017-06-29 | Payer: Medicare Other | Source: Ambulatory Visit

## 2017-06-29 ENCOUNTER — Encounter (HOSPITAL_COMMUNITY): Payer: Medicare Other

## 2017-06-30 ENCOUNTER — Inpatient Hospital Stay: Payer: Medicare Other

## 2017-06-30 ENCOUNTER — Inpatient Hospital Stay: Payer: Medicare Other | Attending: Hematology and Oncology

## 2017-06-30 VITALS — BP 133/58 | HR 60 | Temp 97.7°F | Resp 18

## 2017-06-30 DIAGNOSIS — N183 Chronic kidney disease, stage 3 unspecified: Secondary | ICD-10-CM

## 2017-06-30 DIAGNOSIS — D696 Thrombocytopenia, unspecified: Secondary | ICD-10-CM

## 2017-06-30 DIAGNOSIS — D693 Immune thrombocytopenic purpura: Secondary | ICD-10-CM | POA: Diagnosis not present

## 2017-06-30 DIAGNOSIS — D46C Myelodysplastic syndrome with isolated del(5q) chromosomal abnormality: Secondary | ICD-10-CM

## 2017-06-30 DIAGNOSIS — D631 Anemia in chronic kidney disease: Secondary | ICD-10-CM

## 2017-06-30 DIAGNOSIS — D539 Nutritional anemia, unspecified: Secondary | ICD-10-CM

## 2017-06-30 LAB — CBC WITH DIFFERENTIAL/PLATELET
BASOS PCT: 0 %
Basophils Absolute: 0 10*3/uL (ref 0.0–0.1)
Eosinophils Absolute: 0 10*3/uL (ref 0.0–0.5)
Eosinophils Relative: 0 %
HCT: 35 % — ABNORMAL LOW (ref 38.4–49.9)
HEMOGLOBIN: 10.3 g/dL — AB (ref 13.0–17.1)
LYMPHS PCT: 65 %
Lymphs Abs: 2.1 10*3/uL (ref 0.9–3.3)
MCH: 24.1 pg — ABNORMAL LOW (ref 27.2–33.4)
MCHC: 29.4 g/dL — ABNORMAL LOW (ref 32.0–36.0)
MCV: 81.8 fL (ref 79.3–98.0)
MONO ABS: 0.3 10*3/uL (ref 0.1–0.9)
MONOS PCT: 9 %
NEUTROS ABS: 0.8 10*3/uL — AB (ref 1.5–6.5)
NEUTROS PCT: 26 %
Platelets: 60 10*3/uL — ABNORMAL LOW (ref 140–400)
RBC: 4.28 MIL/uL (ref 4.20–5.82)
RDW: 24.5 % — AB (ref 11.0–15.6)
WBC: 3.2 10*3/uL — AB (ref 4.0–10.3)

## 2017-06-30 MED ORDER — ROMIPLOSTIM 250 MCG ~~LOC~~ SOLR
250.0000 ug | SUBCUTANEOUS | Status: DC
  Administered 2017-06-30: 250 ug via SUBCUTANEOUS
  Filled 2017-06-30: qty 0.5

## 2017-06-30 NOTE — Patient Instructions (Signed)
Romiplostim injection What is this medicine? ROMIPLOSTIM (roe mi PLOE stim) helps your body make more platelets. This medicine is used to treat low platelets caused by chronic idiopathic thrombocytopenic purpura (ITP). This medicine may be used for other purposes; ask your health care provider or pharmacist if you have questions. COMMON BRAND NAME(S): Nplate What should I tell my health care provider before I take this medicine? They need to know if you have any of these conditions: -cancer or myelodysplastic syndrome -low blood counts, like low white cell, platelet, or red cell counts -take medicines that treat or prevent blood clots -an unusual or allergic reaction to romiplostim, mannitol, other medicines, foods, dyes, or preservatives -pregnant or trying to get pregnant -breast-feeding How should I use this medicine? This medicine is for injection under the skin. It is given by a health care professional in a hospital or clinic setting. A special MedGuide will be given to you before your injection. Read this information carefully each time. Talk to your pediatrician regarding the use of this medicine in children. Special care may be needed. Overdosage: If you think you have taken too much of this medicine contact a poison control center or emergency room at once. NOTE: This medicine is only for you. Do not share this medicine with others. What if I miss a dose? It is important not to miss your dose. Call your doctor or health care professional if you are unable to keep an appointment. What may interact with this medicine? Interactions are not expected. This list may not describe all possible interactions. Give your health care provider a list of all the medicines, herbs, non-prescription drugs, or dietary supplements you use. Also tell them if you smoke, drink alcohol, or use illegal drugs. Some items may interact with your medicine. What should I watch for while using this  medicine? Your condition will be monitored carefully while you are receiving this medicine. Visit your prescriber or health care professional for regular checks on your progress and for the needed blood tests. It is important to keep all appointments. What side effects may I notice from receiving this medicine? Side effects that you should report to your doctor or health care professional as soon as possible: -allergic reactions like skin rash, itching or hives, swelling of the face, lips, or tongue -shortness of breath, chest pain, swelling in a leg -unusual bleeding or bruising Side effects that usually do not require medical attention (report to your doctor or health care professional if they continue or are bothersome): -dizziness -headache -muscle aches -pain in arms and legs -stomach pain -trouble sleeping This list may not describe all possible side effects. Call your doctor for medical advice about side effects. You may report side effects to FDA at 1-800-FDA-1088. Where should I keep my medicine? This drug is given in a hospital or clinic and will not be stored at home. NOTE: This sheet is a summary. It may not cover all possible information. If you have questions about this medicine, talk to your doctor, pharmacist, or health care provider.  2018 Elsevier/Gold Standard (2008-02-05 15:13:04)  

## 2017-07-05 ENCOUNTER — Other Ambulatory Visit: Payer: Self-pay | Admitting: Family Medicine

## 2017-07-05 DIAGNOSIS — E079 Disorder of thyroid, unspecified: Secondary | ICD-10-CM

## 2017-07-05 DIAGNOSIS — R911 Solitary pulmonary nodule: Secondary | ICD-10-CM

## 2017-07-07 ENCOUNTER — Inpatient Hospital Stay: Payer: Medicare Other

## 2017-07-07 VITALS — BP 132/66 | HR 62 | Temp 97.8°F | Resp 18

## 2017-07-07 DIAGNOSIS — D539 Nutritional anemia, unspecified: Secondary | ICD-10-CM

## 2017-07-07 DIAGNOSIS — D693 Immune thrombocytopenic purpura: Secondary | ICD-10-CM | POA: Diagnosis not present

## 2017-07-07 DIAGNOSIS — D46C Myelodysplastic syndrome with isolated del(5q) chromosomal abnormality: Secondary | ICD-10-CM

## 2017-07-07 DIAGNOSIS — D696 Thrombocytopenia, unspecified: Secondary | ICD-10-CM

## 2017-07-07 DIAGNOSIS — N183 Chronic kidney disease, stage 3 unspecified: Secondary | ICD-10-CM

## 2017-07-07 DIAGNOSIS — D631 Anemia in chronic kidney disease: Secondary | ICD-10-CM

## 2017-07-07 LAB — CBC WITH DIFFERENTIAL/PLATELET
BASOS ABS: 0 10*3/uL (ref 0.0–0.1)
Basophils Relative: 0 %
EOS ABS: 0 10*3/uL (ref 0.0–0.5)
EOS PCT: 0 %
HCT: 34.6 % — ABNORMAL LOW (ref 38.4–49.9)
Hemoglobin: 10.2 g/dL — ABNORMAL LOW (ref 13.0–17.1)
Lymphocytes Relative: 65 %
Lymphs Abs: 1.7 10*3/uL (ref 0.9–3.3)
MCH: 24 pg — ABNORMAL LOW (ref 27.2–33.4)
MCHC: 29.5 g/dL — ABNORMAL LOW (ref 32.0–36.0)
MCV: 81.4 fL (ref 79.3–98.0)
MONO ABS: 0.3 10*3/uL (ref 0.1–0.9)
Monocytes Relative: 9 %
Neutro Abs: 0.7 10*3/uL — ABNORMAL LOW (ref 1.5–6.5)
Neutrophils Relative %: 26 %
PLATELETS: 63 10*3/uL — AB (ref 140–400)
RBC: 4.25 MIL/uL (ref 4.20–5.82)
RDW: 24.4 % — AB (ref 11.0–15.6)
WBC: 2.7 10*3/uL — AB (ref 4.0–10.3)

## 2017-07-07 MED ORDER — ROMIPLOSTIM 250 MCG ~~LOC~~ SOLR
250.0000 ug | SUBCUTANEOUS | Status: DC
  Administered 2017-07-07: 250 ug via SUBCUTANEOUS
  Filled 2017-07-07: qty 0.5

## 2017-07-07 MED ORDER — DARBEPOETIN ALFA 200 MCG/0.4ML IJ SOSY: 200.0000 ug | PREFILLED_SYRINGE | Freq: Once | INTRAMUSCULAR | Status: DC

## 2017-07-07 NOTE — Patient Instructions (Signed)
Romiplostim injection What is this medicine? ROMIPLOSTIM (roe mi PLOE stim) helps your body make more platelets. This medicine is used to treat low platelets caused by chronic idiopathic thrombocytopenic purpura (ITP). This medicine may be used for other purposes; ask your health care provider or pharmacist if you have questions. COMMON BRAND NAME(S): Nplate What should I tell my health care provider before I take this medicine? They need to know if you have any of these conditions: -cancer or myelodysplastic syndrome -low blood counts, like low white cell, platelet, or red cell counts -take medicines that treat or prevent blood clots -an unusual or allergic reaction to romiplostim, mannitol, other medicines, foods, dyes, or preservatives -pregnant or trying to get pregnant -breast-feeding How should I use this medicine? This medicine is for injection under the skin. It is given by a health care professional in a hospital or clinic setting. A special MedGuide will be given to you before your injection. Read this information carefully each time. Talk to your pediatrician regarding the use of this medicine in children. Special care may be needed. Overdosage: If you think you have taken too much of this medicine contact a poison control center or emergency room at once. NOTE: This medicine is only for you. Do not share this medicine with others. What if I miss a dose? It is important not to miss your dose. Call your doctor or health care professional if you are unable to keep an appointment. What may interact with this medicine? Interactions are not expected. This list may not describe all possible interactions. Give your health care provider a list of all the medicines, herbs, non-prescription drugs, or dietary supplements you use. Also tell them if you smoke, drink alcohol, or use illegal drugs. Some items may interact with your medicine. What should I watch for while using this  medicine? Your condition will be monitored carefully while you are receiving this medicine. Visit your prescriber or health care professional for regular checks on your progress and for the needed blood tests. It is important to keep all appointments. What side effects may I notice from receiving this medicine? Side effects that you should report to your doctor or health care professional as soon as possible: -allergic reactions like skin rash, itching or hives, swelling of the face, lips, or tongue -shortness of breath, chest pain, swelling in a leg -unusual bleeding or bruising Side effects that usually do not require medical attention (report to your doctor or health care professional if they continue or are bothersome): -dizziness -headache -muscle aches -pain in arms and legs -stomach pain -trouble sleeping This list may not describe all possible side effects. Call your doctor for medical advice about side effects. You may report side effects to FDA at 1-800-FDA-1088. Where should I keep my medicine? This drug is given in a hospital or clinic and will not be stored at home. NOTE: This sheet is a summary. It may not cover all possible information. If you have questions about this medicine, talk to your doctor, pharmacist, or health care provider.  2018 Elsevier/Gold Standard (2008-02-05 15:13:04)  

## 2017-07-12 ENCOUNTER — Ambulatory Visit
Admission: RE | Admit: 2017-07-12 | Discharge: 2017-07-12 | Disposition: A | Discharge status: Home / Self Care | Payer: Medicare Other | Source: Ambulatory Visit | Attending: Family Medicine | Admitting: Family Medicine

## 2017-07-12 ENCOUNTER — Encounter: Payer: Self-pay | Admitting: Family Medicine

## 2017-07-12 DIAGNOSIS — E079 Disorder of thyroid, unspecified: Secondary | ICD-10-CM

## 2017-07-12 DIAGNOSIS — E041 Nontoxic single thyroid nodule: Secondary | ICD-10-CM | POA: Diagnosis not present

## 2017-07-13 ENCOUNTER — Other Ambulatory Visit: Payer: Self-pay | Admitting: Family Medicine

## 2017-07-14 ENCOUNTER — Telehealth: Payer: Self-pay | Admitting: Family Medicine

## 2017-07-14 ENCOUNTER — Inpatient Hospital Stay: Payer: Medicare Other

## 2017-07-14 VITALS — BP 152/85 | HR 78 | Temp 97.7°F | Resp 20

## 2017-07-14 DIAGNOSIS — D696 Thrombocytopenia, unspecified: Secondary | ICD-10-CM

## 2017-07-14 DIAGNOSIS — N183 Chronic kidney disease, stage 3 unspecified: Secondary | ICD-10-CM

## 2017-07-14 DIAGNOSIS — D631 Anemia in chronic kidney disease: Secondary | ICD-10-CM

## 2017-07-14 DIAGNOSIS — D46C Myelodysplastic syndrome with isolated del(5q) chromosomal abnormality: Secondary | ICD-10-CM

## 2017-07-14 DIAGNOSIS — D693 Immune thrombocytopenic purpura: Secondary | ICD-10-CM | POA: Diagnosis not present

## 2017-07-14 DIAGNOSIS — D539 Nutritional anemia, unspecified: Secondary | ICD-10-CM

## 2017-07-14 LAB — CBC WITH DIFFERENTIAL/PLATELET
BASOS ABS: 0 10*3/uL (ref 0.0–0.1)
BASOS PCT: 0 %
Eosinophils Absolute: 0 10*3/uL (ref 0.0–0.5)
Eosinophils Relative: 1 %
HEMATOCRIT: 35.2 % — AB (ref 38.4–49.9)
Hemoglobin: 10.4 g/dL — ABNORMAL LOW (ref 13.0–17.1)
LYMPHS PCT: 62 %
Lymphs Abs: 2.3 10*3/uL (ref 0.9–3.3)
MCH: 23.9 pg — ABNORMAL LOW (ref 27.2–33.4)
MCHC: 29.5 g/dL — ABNORMAL LOW (ref 32.0–36.0)
MCV: 80.9 fL (ref 79.3–98.0)
MONO ABS: 0.3 10*3/uL (ref 0.1–0.9)
Monocytes Relative: 8 %
NEUTROS ABS: 1.1 10*3/uL — AB (ref 1.5–6.5)
Neutrophils Relative %: 29 %
Platelets: 83 10*3/uL — ABNORMAL LOW (ref 140–400)
RBC: 4.35 MIL/uL (ref 4.20–5.82)
RDW: 24.6 % — AB (ref 11.0–15.6)
WBC: 3.8 10*3/uL — AB (ref 4.0–10.3)

## 2017-07-14 MED ORDER — DARBEPOETIN ALFA 200 MCG/0.4ML IJ SOSY
PREFILLED_SYRINGE | INTRAMUSCULAR | Status: AC
  Filled 2017-07-14: qty 0.4

## 2017-07-14 MED ORDER — INSULIN ASPART 100 UNIT/ML CARTRIDGE (PENFILL): SUBCUTANEOUS | 2 refills | Status: DC

## 2017-07-14 MED ORDER — ROMIPLOSTIM 250 MCG ~~LOC~~ SOLR
250.0000 ug | SUBCUTANEOUS | Status: DC
  Administered 2017-07-14: 250 ug via SUBCUTANEOUS
  Filled 2017-07-14: qty 0.5

## 2017-07-14 NOTE — Telephone Encounter (Signed)
Pt's bs readings   FBS         2 hr pp    209  351    177  306             197  340    200      143  408      118  257    124  269    141  269    121  271    107  271    116  309 Per Dr. Dennard Schaumann fasting BS is excellent. 2 hr pp sugars are too high. Lantus dose is fine but we need to add Novolog 5 units with Lunch and Dinner and recheck sugars in 1 week.   Pt's wife aware and med sent to pharm.

## 2017-07-14 NOTE — Patient Instructions (Signed)
Romiplostim injection What is this medicine? ROMIPLOSTIM (roe mi PLOE stim) helps your body make more platelets. This medicine is used to treat low platelets caused by chronic idiopathic thrombocytopenic purpura (ITP). This medicine may be used for other purposes; ask your health care provider or pharmacist if you have questions. COMMON BRAND NAME(S): Nplate What should I tell my health care provider before I take this medicine? They need to know if you have any of these conditions: -cancer or myelodysplastic syndrome -low blood counts, like low white cell, platelet, or red cell counts -take medicines that treat or prevent blood clots -an unusual or allergic reaction to romiplostim, mannitol, other medicines, foods, dyes, or preservatives -pregnant or trying to get pregnant -breast-feeding How should I use this medicine? This medicine is for injection under the skin. It is given by a health care professional in a hospital or clinic setting. A special MedGuide will be given to you before your injection. Read this information carefully each time. Talk to your pediatrician regarding the use of this medicine in children. Special care may be needed. Overdosage: If you think you have taken too much of this medicine contact a poison control center or emergency room at once. NOTE: This medicine is only for you. Do not share this medicine with others. What if I miss a dose? It is important not to miss your dose. Call your doctor or health care professional if you are unable to keep an appointment. What may interact with this medicine? Interactions are not expected. This list may not describe all possible interactions. Give your health care provider a list of all the medicines, herbs, non-prescription drugs, or dietary supplements you use. Also tell them if you smoke, drink alcohol, or use illegal drugs. Some items may interact with your medicine. What should I watch for while using this  medicine? Your condition will be monitored carefully while you are receiving this medicine. Visit your prescriber or health care professional for regular checks on your progress and for the needed blood tests. It is important to keep all appointments. What side effects may I notice from receiving this medicine? Side effects that you should report to your doctor or health care professional as soon as possible: -allergic reactions like skin rash, itching or hives, swelling of the face, lips, or tongue -shortness of breath, chest pain, swelling in a leg -unusual bleeding or bruising Side effects that usually do not require medical attention (report to your doctor or health care professional if they continue or are bothersome): -dizziness -headache -muscle aches -pain in arms and legs -stomach pain -trouble sleeping This list may not describe all possible side effects. Call your doctor for medical advice about side effects. You may report side effects to FDA at 1-800-FDA-1088. Where should I keep my medicine? This drug is given in a hospital or clinic and will not be stored at home. NOTE: This sheet is a summary. It may not cover all possible information. If you have questions about this medicine, talk to your doctor, pharmacist, or health care provider.  2018 Elsevier/Gold Standard (2008-02-05 15:13:04)  

## 2017-07-15 ENCOUNTER — Other Ambulatory Visit: Payer: Self-pay | Admitting: Family Medicine

## 2017-07-15 MED ORDER — INSULIN LISPRO 100 UNIT/ML (KWIKPEN): PEN_INJECTOR | SUBCUTANEOUS | 3 refills | Status: DC

## 2017-07-18 ENCOUNTER — Ambulatory Visit (INDEPENDENT_AMBULATORY_CARE_PROVIDER_SITE_OTHER): Payer: Medicare Other | Admitting: Podiatry

## 2017-07-18 ENCOUNTER — Encounter: Payer: Self-pay | Admitting: Podiatry

## 2017-07-18 DIAGNOSIS — B351 Tinea unguium: Secondary | ICD-10-CM | POA: Diagnosis not present

## 2017-07-18 DIAGNOSIS — M79674 Pain in right toe(s): Secondary | ICD-10-CM

## 2017-07-18 DIAGNOSIS — D689 Coagulation defect, unspecified: Secondary | ICD-10-CM

## 2017-07-18 DIAGNOSIS — M79675 Pain in left toe(s): Secondary | ICD-10-CM

## 2017-07-20 NOTE — Progress Notes (Signed)
Subjective:   Patient ID: Ricardo Watkins, male   DOB: 84 y.o.   MRN: [id number]   HPI Patient presents with elongated nailbeds 1-5 both the that he cannot cut and is on blood thinner and at risk of diabetes also   ROS      Objective:  Physical Exam  Neurovascular status unchanged with thick yellow brittle nailbeds 1-5 both feet that are incurvated and painful     Assessment:  Mycotic nail infection with pain 1-5 both feet with blood clotting disorder     Plan:  Debride painful nailbeds 1-5 both feet with no iatrogenic bleeding and reappoint for routine visit

## 2017-07-21 ENCOUNTER — Inpatient Hospital Stay: Payer: Medicare Other

## 2017-07-21 VITALS — BP 152/61 | HR 61 | Resp 18

## 2017-07-21 DIAGNOSIS — D46C Myelodysplastic syndrome with isolated del(5q) chromosomal abnormality: Secondary | ICD-10-CM

## 2017-07-21 DIAGNOSIS — D693 Immune thrombocytopenic purpura: Secondary | ICD-10-CM | POA: Diagnosis not present

## 2017-07-21 DIAGNOSIS — N183 Chronic kidney disease, stage 3 unspecified: Secondary | ICD-10-CM

## 2017-07-21 DIAGNOSIS — D696 Thrombocytopenia, unspecified: Secondary | ICD-10-CM

## 2017-07-21 DIAGNOSIS — D539 Nutritional anemia, unspecified: Secondary | ICD-10-CM

## 2017-07-21 DIAGNOSIS — D631 Anemia in chronic kidney disease: Secondary | ICD-10-CM

## 2017-07-21 LAB — CBC WITH DIFFERENTIAL/PLATELET
BASOS ABS: 0 10*3/uL (ref 0.0–0.1)
Basophils Relative: 0 %
Eosinophils Absolute: 0.1 10*3/uL (ref 0.0–0.5)
Eosinophils Relative: 2 %
HEMATOCRIT: 36.8 % — AB (ref 38.4–49.9)
Hemoglobin: 10.7 g/dL — ABNORMAL LOW (ref 13.0–17.1)
LYMPHS ABS: 2.1 10*3/uL (ref 0.9–3.3)
LYMPHS PCT: 59 %
MCH: 23.6 pg — AB (ref 27.2–33.4)
MCHC: 29.1 g/dL — ABNORMAL LOW (ref 32.0–36.0)
MCV: 81.1 fL (ref 79.3–98.0)
Monocytes Absolute: 0.5 10*3/uL (ref 0.1–0.9)
Monocytes Relative: 13 %
NEUTROS ABS: 0.9 10*3/uL — AB (ref 1.5–6.5)
Neutrophils Relative %: 26 %
Platelets: 146 10*3/uL (ref 140–400)
RBC: 4.54 MIL/uL (ref 4.20–5.82)
RDW: 25.6 % — ABNORMAL HIGH (ref 11.0–14.6)
WBC: 3.6 10*3/uL — AB (ref 4.0–10.3)

## 2017-07-21 MED ORDER — DARBEPOETIN ALFA 200 MCG/0.4ML IJ SOSY: 200.0000 ug | PREFILLED_SYRINGE | Freq: Once | INTRAMUSCULAR | Status: DC

## 2017-07-21 MED ORDER — ROMIPLOSTIM 250 MCG ~~LOC~~ SOLR
250.0000 ug | SUBCUTANEOUS | Status: DC
  Administered 2017-07-21: 250 ug via SUBCUTANEOUS
  Filled 2017-07-21: qty 0.5

## 2017-07-21 MED ORDER — CYANOCOBALAMIN 1000 MCG/ML IJ SOLN
1000.0000 ug | Freq: Once | INTRAMUSCULAR | Status: AC
  Administered 2017-07-21: 1000 ug via INTRAMUSCULAR

## 2017-07-26 ENCOUNTER — Ambulatory Visit (INDEPENDENT_AMBULATORY_CARE_PROVIDER_SITE_OTHER): Payer: Medicare Other | Admitting: Internal Medicine

## 2017-07-26 ENCOUNTER — Encounter: Payer: Self-pay | Admitting: Internal Medicine

## 2017-07-26 VITALS — BP 124/64 | HR 72 | Ht 67.0 in | Wt 187.0 lb

## 2017-07-26 DIAGNOSIS — J90 Pleural effusion, not elsewhere classified: Secondary | ICD-10-CM

## 2017-07-26 DIAGNOSIS — R9389 Abnormal findings on diagnostic imaging of other specified body structures: Secondary | ICD-10-CM | POA: Diagnosis not present

## 2017-07-26 NOTE — Progress Notes (Signed)
Subjective:    Patient ID: Ricardo Watkins, male    DOB: [birth year],   MRN: [id number]    Brief patient profile:  84 yowm  With remote follicular lymphoma/ now  MDS quit smoking 1960 referred to pulmonary clinic 11/14/2015 by Dr [name] for L pleual effusion first detected 10/30/14     History of Present Illness  11/14/2015 1st [facility] office visit/ Sharian Delia   Chief Complaint  Patient presents with  . Pulmonary Consult    Referred by [name] for eval of pleural effusion. He c/o cough for the past 4-5 months. Cough is esp worse when he lies down and is prod with clear sputum. He has SOB when walking up an incline.   indolent onset progressively severe 24/7 dry coughing fits x 5 months with only mild doe with exertion/ cough worse supine, never productive, no assoc wheeze and no better with  otc cough meds rec Please see patient coordinator before you leave today  to schedule thoracentesis of Your Left side and I will call you with the results Stop acuupril and start diovan 160 mg daily    - L thoracentesis 11/24/2015 > 1.6 liters of hazy, amber fluid >>   Wbc 1859 L/M > P and exudative by Prot, nl glucose/ cyt>> neg/ predominently T cells, no evidence of lymphoma or chylous effusion      -  L pleurex 02/10/16 for non-malignant effusion  -  #28 F chest tube placed 04/08/16 by Lucianne Lei tright for dx empyema during admit:   Admit date: 04/03/2016 Discharge date: 04/20/2016 A    Brief/Interim Summary: 84 y.o.malewithwith medical history significant of non-Hodgkin's lymphoma, CAD, type 2 diabetes, hyperlipidemia, hypertension, anemia, prostate CA, diverticulosis, cataracts, DVT, chronic cough, chronic left pleural effusion with insertion of left pleural catheter on 11/10/2015 by [name] who comes to the hospital due to fever, chills, worsening fatigue and malaise. Per patient, his daughter and wife, his chest tube drainage has been decreasing his output for the pastseveral days and  he stopped completely the day PTA. He normally has up to 500 mL of drainage every other day. Patient was worked up and found to be septic and have a loculated effusion. TCV following patient, he went to OR on 10/19 and had the non functioning catheter removed and placed a new pigtail. Patient had a second chest tube placed in an adjacent collection on 10/20 which was removed on 10/22, currently with only one chest tube in place with minimal drainage. Initial drainage cultures positive for coag negative staph sensitive to penicillins, on Zosyn.   Discharge Diagnoses:  Sepsis due to loculated Effusion/Empyema from Left Obstructed Pleural Catheter for recurrent and chronic Non Malignant Pleural Effusions with hypoxic respiratory failure - CT Scan on admission showed Moderate-sized loculated left pleural effusion collection with a PleurX drainage catheter in place.Patient was placed on broad-spectrum antibiotics of vancomycin and Zosyn  - Sepsis physiology resolved; Old left PleurX catheter removed by TCTS10/19. Vancomycin was discontinued on 10/22, MRSAPCR negative. - IR placed CT Guided Fluid Drain by PigTail Catheter 10/16 and drained ~550 mL. Patient taken to OR 10/19 with removal of old catheter and placement of a left sided chest tube  - post op had significant leukocytosis, up to 25 >> 23.9 >>17.3>>15.5>>19.9>>15.2>>13.4>>10.5>>8.8 - removed one of thechest tubes10/22 per TCTS - Fluid collected on 10/19 during surgery grew coagulase-negative staph which is pansensitive.  -10/27--d/c zosyn, start augmentin--plan 28 days of abx total (start date 10/14)--last day of  abx 05/01/16           Admit date: 06/02/2017 Discharge date: 06/04/2017    Discharge Diagnoses:      Active Hospital Problems   Diagnosis Date Noted  . Acute kidney injury superimposed on chronic kidney disease ([place]) 06/02/2017  . Flu-like symptoms 06/02/2017  . Elevated lactic acid level 06/02/2017  . CKD  (chronic kidney disease), stage IV ([place]) 12/16/2016  . Chronic ITP (idiopathic thrombocytopenia) (HCC) 07/01/2016  . Diarrhea 04/08/2015  . Pancytopenia ([place]) 04/08/2015  . MDS (myelodysplastic syndrome) with 5q deletion ([employer]) 11/20/2014  . Thrombocytopenia ([place]) 04/19/2014  . HTN (hypertension) 08/26/2010  . Diabetes ([place]) 08/25/2010    Resolved Hospital Problems  No resolved problems to display.    Discharge Condition: Stable  Diet recommendation: Heart healthy      Vitals:   06/03/17 2108 06/04/17 0515  BP: 138/77 (!) 146/88  Pulse: 98 92  Resp: (!) 25 (!) 22  Temp: 98.7 F (37.1 C) (!) 97.5 F (36.4 C)  SpO2: 95% 98%    History of present illness:  84 y.o.malewithwith medical history significantfor MDS, stroke, chronic kidney disease hypertension, diabetes, CAD presents to the emergency department from his PCPs office chief complaint 3 day history of decreased oral intake nausea abdominal pain leading to generalized weakness. Initial evaluation reveals acute on chronic kidney disease,elevated lactic acid mild tachycardia in the setting of dehydration.   Hospital Course:  Principal Problem:   Acute kidney injury superimposed on chronic kidney disease ([place]) Active Problems:   HTN (hypertension)   Diabetes (HCC)   Thrombocytopenia (HCC)   MDS (myelodysplastic syndrome) with 5q deletion (HCC)   Pancytopenia (HCC)   Diarrhea   Chronic ITP (idiopathic thrombocytopenia) (HCC)   CKD (chronic kidney disease), stage IV (HCC)   Flu-like symptoms   Elevated lactic acid level  1. Acute kidney injury superimposed on chronic kidney disease stage III. Resolved  Likely related to decreased oral intake in the setting of a likely viral illness Encourage adequate hydration  #2. Flulike symptoms/elevated lactic acid/generalized weakness. Resolved  Lactic acid trending down to 1.3 from 2.5. Likely related to dehydration in the setting of intermittent nausea/abdominal  pain. Chest x-ray no edema or consolidation. CT of the abdomen with diverticulosis no diverticulitis. Max temp 100.6no leukocytosis hypoxic hemodynamically stable. Influenza negative at PCP office -Home PT       07/26/2017  f/u ov/Johntay Doolen re:  Re-establish re abn ct chest s/p admit  Chief Complaint  Patient presents with  . pulmonary consult    Referred by Dr. Dennard Schaumann for nodule on CT scan1/2019  doe = MMRC3 =MMRC2 = can't walk a nl pace on a flat grade s sob but does fine slow and flat eg walking at [place] No cough  Sleeping ok    No obvious day to day or daytime variability or assoc excess/ purulent sputum or mucus plugs or hemoptysis or cp or chest tightness, subjective wheeze or overt sinus or hb symptoms. No unusual exposure hx or h/o childhood pna/ asthma or knowledge of premature birth.  Sleeping ok flat without nocturnal  or early am exacerbation  of respiratory  c/o's or need for noct saba. Also denies any obvious fluctuation of symptoms with weather or environmental changes or other aggravating or alleviating factors except as outlined above   Current Allergies, Complete Past Medical History, Past Surgical History, Family History, and Social History were reviewed in [employer] record.  ROS  The following are not active  complaints unless bolded Hoarseness, sore throat, dysphagia, dental problems, itching, sneezing,  nasal congestion or discharge of excess mucus or purulent secretions, ear ache,   fever, chills, sweats, unintended wt loss or wt gain, classically pleuritic or exertional cp,  orthopnea pnd or leg swelling, presyncope, palpitations, abdominal pain, anorexia, nausea, vomiting, diarrhea  or change in bowel habits or change in bladder habits, change in stools or change in urine, dysuria, hematuria,  rash, arthralgias, visual complaints, headache, numbness, weakness or ataxia or problems with walking or coordination,  change in mood/affect or memory.         Current Meds  Medication Sig  . acetaminophen (TYLENOL) 500 MG tablet Take 1,000 mg by mouth every 6 (six) hours as needed for mild pain, moderate pain or headache.   . clopidogrel (PLAVIX) 75 MG tablet TAKE 1 TABLET BY MOUTH ONCE A DAY WITH BREAKFAST  . cyanocobalamin (,VITAMIN B-12,) 1000 MCG/ML injection Inject 1 mL (1,000 mcg total) into the muscle every 30 (thirty) days.  . furosemide (LASIX) 40 MG tablet TAKE 1 TABLET BY MOUTH ONCE A DAY  . glucose blood test strip Check BS TID  . hydrocortisone cream 1 % Apply 1 application topically at bedtime as needed for itching.  . Insulin Glargine (LANTUS SOLOSTAR) 100 UNIT/ML Solostar Pen INJECT 25 UNITS EVERY NIGHT AT BEDTIME (Patient taking differently: Inject 22 Units into the skin daily at 10 pm. INJECT 25 UNITS EVERY NIGHT AT BEDTIME)  . insulin lispro (HUMALOG) 100 UNIT/ML KiwkPen Inject 5 units Lunch & Dinner only  . LANTUS SOLOSTAR 100 UNIT/ML Solostar Pen INJECT 25 UNITS EVERY NIGHT AT BEDTIME AS DIRECTED  . meclizine (ANTIVERT) 12.5 MG tablet Take 1 tablet (12.5 mg total) by mouth 3 (three) times daily as needed for dizziness.  . metoCLOPramide (REGLAN) 10 MG tablet Take 1 tablet (10 mg total) by mouth 4 (four) times daily.  . metoprolol succinate (TOPROL-XL) 100 MG 24 hr tablet TAKE 1 TABLET BY MOUTH ONCE A DAY  . Multiple Vitamin (MULTIVITAMIN WITH MINERALS) TABS tablet Take 1 tablet by mouth at bedtime.  Marland Kitchen OVER THE COUNTER MEDICATION Place 1 drop into both eyes daily as needed (dry eyes). Over the counter lubricating eye drop  . SYRINGE-NEEDLE, DISP, 3 ML (LUER LOCK SAFETY SYRINGES) 25G X 1" 3 ML MISC Use with B 12 injections q month  . tamsulosin (FLOMAX) 0.4 MG CAPS capsule TAKE 1 CAPSULE BY MOUTH ONCE DAILY  . traMADol (ULTRAM) 50 MG tablet Take 1 tablet (50 mg total) by mouth every 8 (eight) hours as needed.  . vitamin C (ASCORBIC ACID) 500 MG tablet Take 500 mg by mouth daily.   Current Facility-Administered Medications for  the 07/26/17 encounter (Office Visit) with Tanda Rockers, MD  Medication  . cyanocobalamin ((VITAMIN B-12)) injection 1,000 mcg                        Objective:   Physical Exam      07/26/2017          197  01/27/2016          194  12/12/2015       191   11/14/15 193 lb 6.4 oz (87.726 kg)  11/13/15 193 lb (87.544 kg)  11/12/15 194 lb 11.2 oz (88.315 kg)    Vital signs reviewed - Note on arrival 02 sats  98% on RA    Elderly wm / slow response to verbal questions  HEENT: nl  dentition, turbinates bilaterally, and oropharynx. Nl external ear canals without cough reflex   NECK :  without JVD/Nodes/TM/ nl carotid upstrokes bilaterally   LUNGS: no acc muscle use,  Nl contour chest which is clear to A and P bilaterally without cough on insp or exp maneuvers   CV:  slt irreg rhythm    no s3 or murmur or increase in P2, and no edema   ABD:  soft and nontender with nl inspiratory excursion in the supine position. No bruits or organomegaly appreciated, bowel sounds nl  MS:  Very slow  gait/ ext warm without deformities, calf tenderness, cyanosis or clubbing No obvious joint restrictions   SKIN: warm and dry without lesions    NEURO:  Alert, very slowed responses, lets his fm do the talking/  with  no motor or cerebellar deficits apparent.               Assessment & Plan:

## 2017-07-26 NOTE — Patient Instructions (Signed)
Please schedule a follow up visit in 3 months but call sooner if needed with cxr on return  

## 2017-07-27 ENCOUNTER — Encounter: Payer: Self-pay | Admitting: Internal Medicine

## 2017-07-27 DIAGNOSIS — R9389 Abnormal findings on diagnostic imaging of other specified body structures: Secondary | ICD-10-CM | POA: Insufficient documentation

## 2017-07-27 NOTE — Assessment & Plan Note (Signed)
first detected 10/30/14  On PET  - See CT chest 11/11/15 - L thoracentesis 11/24/2015 > 1.6 liters of hazy, amber fluid >>   Wbc 1859 L/M > P and exudative by Prot, nl glucose/ cyt>> neg/ predominently T cells, no evidence of lymphoma or chylous effusion   - Re check cxr 12/12/2015 > back to baseline L effusion  - 01/27/2016 worse again > rec repeat T centesis and then T surg eval for ?VATS? - 01/27/16 Quantiferon Gold TB :  neg  - 01/30/2016 repeat u/s t centesis x 2.2 liters:   Exudate by prot/ wbc 1432 with 91% L and neg cyt/flow cytometry/ nl glucose and trig > refer to T surg 02/04/2016 >  -  L pleurex 02/10/16 for non-malignant effusion  -  #28 F chest tube placed 04/08/16 by Lucianne Lei tright for dx empyema with finger debridement of loculations   No significant reaccumulation at this time.

## 2017-07-27 NOTE — Assessment & Plan Note (Addendum)
See ct chest 06/28/17  In reviewing his studies today it does not appear that there are any "marcoscopic" changes on plain film so as not really clear that any of these areas are new or of any clinical significance. His overall appearance to me appear is more consistent with the geriatric decline than a pulmonary malignancy and I don't see what could be gained by any form of early intervention here.  Certainly if oncology feels differently than he could be referred back to Dr. Nils Pyle  for biopsy but from my perspective would not recommend escalation of care without the risk of making things worse.     I had an extended discussion with the patient/fm  reviewing all relevant studies completed to date and  lasting  over 50% of the consultation re:  Discussed in detail all the  indications, usual  risks and alternatives  relative to the benefits with patient and fm  who agrees to proceed with conservative f/u as outlined = f/u with cxr in 3 months and no CT's unless oncology feels differently (too sensitive for clinical relevance in the setting of overall geriatric decline).  Each maintenance medication was reviewed in detail including most importantly the difference between maintenance and prns and under what circumstances the prns are to be triggered using an action plan format that is not reflected in the computer generated alphabetically organized AVS.    Please see AVS for specific instructions unique to this visit that I personally wrote and verbalized to the the pt in detail and then reviewed with pt  by my nurse highlighting any  changes in therapy recommended at today's visit to their plan of care.

## 2017-07-28 ENCOUNTER — Inpatient Hospital Stay: Payer: Medicare Other

## 2017-07-28 ENCOUNTER — Inpatient Hospital Stay: Payer: Medicare Other | Attending: Hematology and Oncology

## 2017-07-28 VITALS — BP 138/81 | HR 79 | Temp 98.1°F | Resp 20

## 2017-07-28 DIAGNOSIS — D46C Myelodysplastic syndrome with isolated del(5q) chromosomal abnormality: Secondary | ICD-10-CM

## 2017-07-28 DIAGNOSIS — D693 Immune thrombocytopenic purpura: Secondary | ICD-10-CM | POA: Diagnosis not present

## 2017-07-28 DIAGNOSIS — N183 Chronic kidney disease, stage 3 unspecified: Secondary | ICD-10-CM

## 2017-07-28 DIAGNOSIS — D631 Anemia in chronic kidney disease: Secondary | ICD-10-CM

## 2017-07-28 DIAGNOSIS — D696 Thrombocytopenia, unspecified: Secondary | ICD-10-CM

## 2017-07-28 DIAGNOSIS — D539 Nutritional anemia, unspecified: Secondary | ICD-10-CM

## 2017-07-28 LAB — CBC WITH DIFFERENTIAL/PLATELET
BASOS PCT: 0 %
Basophils Absolute: 0 10*3/uL (ref 0.0–0.1)
Eosinophils Absolute: 0 10*3/uL (ref 0.0–0.5)
Eosinophils Relative: 1 %
HEMATOCRIT: 34 % — AB (ref 38.4–49.9)
HEMOGLOBIN: 10 g/dL — AB (ref 13.0–17.1)
LYMPHS ABS: 2.2 10*3/uL (ref 0.9–3.3)
LYMPHS PCT: 62 %
MCH: 24 pg — AB (ref 27.2–33.4)
MCHC: 29.4 g/dL — AB (ref 32.0–36.0)
MCV: 81.5 fL (ref 79.3–98.0)
MONO ABS: 0.5 10*3/uL (ref 0.1–0.9)
Monocytes Relative: 15 %
NEUTROS ABS: 0.8 10*3/uL — AB (ref 1.5–6.5)
NEUTROS PCT: 22 %
NRBC: 0 /100{WBCs}
Platelets: 129 10*3/uL — ABNORMAL LOW (ref 140–400)
RBC: 4.17 MIL/uL — ABNORMAL LOW (ref 4.20–5.82)
RDW: 25.9 % — ABNORMAL HIGH (ref 11.0–14.6)
WBC: 3.5 10*3/uL — ABNORMAL LOW (ref 4.0–10.3)

## 2017-07-28 MED ORDER — ROMIPLOSTIM 250 MCG ~~LOC~~ SOLR
250.0000 ug | SUBCUTANEOUS | Status: DC
  Administered 2017-07-28: 250 ug via SUBCUTANEOUS
  Filled 2017-07-28: qty 0.5

## 2017-07-28 NOTE — Patient Instructions (Signed)
Romiplostim injection What is this medicine? ROMIPLOSTIM (roe mi PLOE stim) helps your body make more platelets. This medicine is used to treat low platelets caused by chronic idiopathic thrombocytopenic purpura (ITP). This medicine may be used for other purposes; ask your health care provider or pharmacist if you have questions. COMMON BRAND NAME(S): Nplate What should I tell my health care provider before I take this medicine? They need to know if you have any of these conditions: -cancer or myelodysplastic syndrome -low blood counts, like low white cell, platelet, or red cell counts -take medicines that treat or prevent blood clots -an unusual or allergic reaction to romiplostim, mannitol, other medicines, foods, dyes, or preservatives -pregnant or trying to get pregnant -breast-feeding How should I use this medicine? This medicine is for injection under the skin. It is given by a health care professional in a hospital or clinic setting. A special MedGuide will be given to you before your injection. Read this information carefully each time. Talk to your pediatrician regarding the use of this medicine in children. Special care may be needed. Overdosage: If you think you have taken too much of this medicine contact a poison control center or emergency room at once. NOTE: This medicine is only for you. Do not share this medicine with others. What if I miss a dose? It is important not to miss your dose. Call your doctor or health care professional if you are unable to keep an appointment. What may interact with this medicine? Interactions are not expected. This list may not describe all possible interactions. Give your health care provider a list of all the medicines, herbs, non-prescription drugs, or dietary supplements you use. Also tell them if you smoke, drink alcohol, or use illegal drugs. Some items may interact with your medicine. What should I watch for while using this  medicine? Your condition will be monitored carefully while you are receiving this medicine. Visit your prescriber or health care professional for regular checks on your progress and for the needed blood tests. It is important to keep all appointments. What side effects may I notice from receiving this medicine? Side effects that you should report to your doctor or health care professional as soon as possible: -allergic reactions like skin rash, itching or hives, swelling of the face, lips, or tongue -shortness of breath, chest pain, swelling in a leg -unusual bleeding or bruising Side effects that usually do not require medical attention (report to your doctor or health care professional if they continue or are bothersome): -dizziness -headache -muscle aches -pain in arms and legs -stomach pain -trouble sleeping This list may not describe all possible side effects. Call your doctor for medical advice about side effects. You may report side effects to FDA at 1-800-FDA-1088. Where should I keep my medicine? This drug is given in a hospital or clinic and will not be stored at home. NOTE: This sheet is a summary. It may not cover all possible information. If you have questions about this medicine, talk to your doctor, pharmacist, or health care provider.  2018 Elsevier/Gold Standard (2008-02-05 15:13:04)  

## 2017-08-01 ENCOUNTER — Telehealth: Payer: Self-pay | Admitting: Family Medicine

## 2017-08-01 NOTE — Telephone Encounter (Signed)
Pt brought in BS readings and per Dr. Dennard Schaumann Increase the Humalog to 10 units bid and recheck in 2 weeks. Pt's wife aware.

## 2017-08-04 ENCOUNTER — Inpatient Hospital Stay: Payer: Medicare Other

## 2017-08-04 VITALS — BP 123/63 | HR 66 | Temp 97.8°F | Resp 18

## 2017-08-04 DIAGNOSIS — D539 Nutritional anemia, unspecified: Secondary | ICD-10-CM

## 2017-08-04 DIAGNOSIS — D46C Myelodysplastic syndrome with isolated del(5q) chromosomal abnormality: Secondary | ICD-10-CM

## 2017-08-04 DIAGNOSIS — D631 Anemia in chronic kidney disease: Secondary | ICD-10-CM

## 2017-08-04 DIAGNOSIS — D696 Thrombocytopenia, unspecified: Secondary | ICD-10-CM

## 2017-08-04 DIAGNOSIS — N183 Chronic kidney disease, stage 3 unspecified: Secondary | ICD-10-CM

## 2017-08-04 DIAGNOSIS — D693 Immune thrombocytopenic purpura: Secondary | ICD-10-CM

## 2017-08-04 LAB — CBC WITH DIFFERENTIAL/PLATELET
Basophils Absolute: 0 10*3/uL (ref 0.0–0.1)
Basophils Relative: 0 %
Eosinophils Absolute: 0 10*3/uL (ref 0.0–0.5)
Eosinophils Relative: 1 %
HCT: 35.8 % — ABNORMAL LOW (ref 38.4–49.9)
Hemoglobin: 10.5 g/dL — ABNORMAL LOW (ref 13.0–17.1)
Lymphocytes Relative: 63 %
Lymphs Abs: 2.2 10*3/uL (ref 0.9–3.3)
MCH: 23.9 pg — ABNORMAL LOW (ref 27.2–33.4)
MCHC: 29.3 g/dL — ABNORMAL LOW (ref 32.0–36.0)
MCV: 81.4 fL (ref 79.3–98.0)
Monocytes Absolute: 0.4 10*3/uL (ref 0.1–0.9)
Monocytes Relative: 11 %
Neutro Abs: 0.9 10*3/uL — ABNORMAL LOW (ref 1.5–6.5)
Neutrophils Relative %: 25 %
Platelets: 57 10*3/uL — ABNORMAL LOW (ref 140–400)
RBC: 4.4 MIL/uL (ref 4.20–5.82)
RDW: 25.5 % — ABNORMAL HIGH (ref 11.0–14.6)
WBC: 3.4 10*3/uL — ABNORMAL LOW (ref 4.0–10.3)
nRBC: 0 /100{WBCs}

## 2017-08-04 MED ORDER — ROMIPLOSTIM 250 MCG ~~LOC~~ SOLR
250.0000 ug | SUBCUTANEOUS | Status: DC
  Administered 2017-08-04: 250 ug via SUBCUTANEOUS
  Filled 2017-08-04: qty 0.5

## 2017-08-04 NOTE — Patient Instructions (Signed)
Romiplostim injection What is this medicine? ROMIPLOSTIM (roe mi PLOE stim) helps your body make more platelets. This medicine is used to treat low platelets caused by chronic idiopathic thrombocytopenic purpura (ITP). This medicine may be used for other purposes; ask your health care provider or pharmacist if you have questions. COMMON BRAND NAME(S): Nplate What should I tell my health care provider before I take this medicine? They need to know if you have any of these conditions: -cancer or myelodysplastic syndrome -low blood counts, like low white cell, platelet, or red cell counts -take medicines that treat or prevent blood clots -an unusual or allergic reaction to romiplostim, mannitol, other medicines, foods, dyes, or preservatives -pregnant or trying to get pregnant -breast-feeding How should I use this medicine? This medicine is for injection under the skin. It is given by a health care professional in a hospital or clinic setting. A special MedGuide will be given to you before your injection. Read this information carefully each time. Talk to your pediatrician regarding the use of this medicine in children. Special care may be needed. Overdosage: If you think you have taken too much of this medicine contact a poison control center or emergency room at once. NOTE: This medicine is only for you. Do not share this medicine with others. What if I miss a dose? It is important not to miss your dose. Call your doctor or health care professional if you are unable to keep an appointment. What may interact with this medicine? Interactions are not expected. This list may not describe all possible interactions. Give your health care provider a list of all the medicines, herbs, non-prescription drugs, or dietary supplements you use. Also tell them if you smoke, drink alcohol, or use illegal drugs. Some items may interact with your medicine. What should I watch for while using this  medicine? Your condition will be monitored carefully while you are receiving this medicine. Visit your prescriber or health care professional for regular checks on your progress and for the needed blood tests. It is important to keep all appointments. What side effects may I notice from receiving this medicine? Side effects that you should report to your doctor or health care professional as soon as possible: -allergic reactions like skin rash, itching or hives, swelling of the face, lips, or tongue -shortness of breath, chest pain, swelling in a leg -unusual bleeding or bruising Side effects that usually do not require medical attention (report to your doctor or health care professional if they continue or are bothersome): -dizziness -headache -muscle aches -pain in arms and legs -stomach pain -trouble sleeping This list may not describe all possible side effects. Call your doctor for medical advice about side effects. You may report side effects to FDA at 1-800-FDA-1088. Where should I keep my medicine? This drug is given in a hospital or clinic and will not be stored at home. NOTE: This sheet is a summary. It may not cover all possible information. If you have questions about this medicine, talk to your doctor, pharmacist, or health care provider.  2018 Elsevier/Gold Standard (2008-02-05 15:13:04)  

## 2017-08-11 ENCOUNTER — Inpatient Hospital Stay: Payer: Medicare Other

## 2017-08-11 VITALS — BP 145/76 | HR 75 | Temp 98.1°F | Resp 18

## 2017-08-11 DIAGNOSIS — N183 Chronic kidney disease, stage 3 unspecified: Secondary | ICD-10-CM

## 2017-08-11 DIAGNOSIS — D693 Immune thrombocytopenic purpura: Secondary | ICD-10-CM | POA: Diagnosis not present

## 2017-08-11 DIAGNOSIS — D696 Thrombocytopenia, unspecified: Secondary | ICD-10-CM

## 2017-08-11 DIAGNOSIS — D631 Anemia in chronic kidney disease: Secondary | ICD-10-CM

## 2017-08-11 DIAGNOSIS — D46C Myelodysplastic syndrome with isolated del(5q) chromosomal abnormality: Secondary | ICD-10-CM

## 2017-08-11 DIAGNOSIS — D539 Nutritional anemia, unspecified: Secondary | ICD-10-CM

## 2017-08-11 LAB — CBC WITH DIFFERENTIAL/PLATELET
BASOS ABS: 0 10*3/uL (ref 0.0–0.1)
BASOS PCT: 0 %
Eosinophils Absolute: 0 10*3/uL (ref 0.0–0.5)
Eosinophils Relative: 0 %
HEMATOCRIT: 36.4 % — AB (ref 38.4–49.9)
HEMOGLOBIN: 10.7 g/dL — AB (ref 13.0–17.1)
LYMPHS PCT: 16 %
Lymphs Abs: 1.8 10*3/uL (ref 0.9–3.3)
MCH: 23.9 pg — ABNORMAL LOW (ref 27.2–33.4)
MCHC: 29.4 g/dL — AB (ref 32.0–36.0)
MCV: 81.3 fL (ref 79.3–98.0)
MONO ABS: 1.3 10*3/uL — AB (ref 0.1–0.9)
Monocytes Relative: 11 %
NEUTROS ABS: 8.3 10*3/uL — AB (ref 1.5–6.5)
NEUTROS PCT: 73 %
Platelets: 75 10*3/uL — ABNORMAL LOW (ref 140–400)
RBC: 4.48 MIL/uL (ref 4.20–5.82)
RDW: 25.7 % — ABNORMAL HIGH (ref 11.0–14.6)
WBC: 11.5 10*3/uL — AB (ref 4.0–10.3)
nRBC: 0 /100 WBC

## 2017-08-11 MED ORDER — CYANOCOBALAMIN 1000 MCG/ML IJ SOLN
INTRAMUSCULAR | Status: AC
  Filled 2017-08-11: qty 1

## 2017-08-11 MED ORDER — DARBEPOETIN ALFA 200 MCG/0.4ML IJ SOSY
PREFILLED_SYRINGE | INTRAMUSCULAR | Status: AC
  Filled 2017-08-11: qty 0.4

## 2017-08-11 MED ORDER — ROMIPLOSTIM 250 MCG ~~LOC~~ SOLR
250.0000 ug | SUBCUTANEOUS | Status: DC
  Administered 2017-08-11: 250 ug via SUBCUTANEOUS
  Filled 2017-08-11: qty 0.5

## 2017-08-11 MED ORDER — CYANOCOBALAMIN 1000 MCG/ML IJ SOLN
1000.0000 ug | Freq: Once | INTRAMUSCULAR | Status: AC
  Administered 2017-08-11: 1000 ug via INTRAMUSCULAR

## 2017-08-15 ENCOUNTER — Encounter: Payer: Self-pay | Admitting: Family Medicine

## 2017-08-15 ENCOUNTER — Ambulatory Visit (INDEPENDENT_AMBULATORY_CARE_PROVIDER_SITE_OTHER): Payer: Medicare Other | Admitting: Family Medicine

## 2017-08-15 VITALS — BP 130/60 | HR 94 | Temp 97.8°F | Resp 20 | Ht 69.0 in | Wt 187.0 lb

## 2017-08-15 DIAGNOSIS — R509 Fever, unspecified: Secondary | ICD-10-CM

## 2017-08-15 DIAGNOSIS — J189 Pneumonia, unspecified organism: Secondary | ICD-10-CM

## 2017-08-15 LAB — COMPLETE METABOLIC PANEL WITH GFR
AG Ratio: 1 (calc) (ref 1.0–2.5)
ALT: 16 U/L (ref 9–46)
AST: 15 U/L (ref 10–35)
Albumin: 3.8 g/dL (ref 3.6–5.1)
Alkaline phosphatase (APISO): 60 U/L (ref 40–115)
BILIRUBIN TOTAL: 0.6 mg/dL (ref 0.2–1.2)
BUN/Creatinine Ratio: 12 (calc) (ref 6–22)
BUN: 22 mg/dL (ref 7–25)
CHLORIDE: 101 mmol/L (ref 98–110)
CO2: 27 mmol/L (ref 20–32)
Calcium: 8.8 mg/dL (ref 8.6–10.3)
Creat: 1.88 mg/dL — ABNORMAL HIGH (ref 0.70–1.11)
GFR, EST AFRICAN AMERICAN: 37 mL/min/{1.73_m2} — AB (ref >=60)
GFR, Est Non African American: 32 mL/min/{1.73_m2} — ABNORMAL LOW (ref >=60)
GLUCOSE: 138 mg/dL — AB (ref 65–99)
Globulin: 3.8 g/dL (calc) — ABNORMAL HIGH (ref 1.9–3.7)
Potassium: 4.6 mmol/L (ref 3.5–5.3)
Sodium: 137 mmol/L (ref 135–146)
TOTAL PROTEIN: 7.6 g/dL (ref 6.1–8.1)

## 2017-08-15 LAB — CBC WITH DIFFERENTIAL/PLATELET
BASOS PCT: 0.2 %
Basophils Absolute: 21 cells/uL (ref 0–200)
EOS PCT: 0 %
Eosinophils Absolute: 0 cells/uL — ABNORMAL LOW (ref 15–500)
HCT: 33.6 % — ABNORMAL LOW (ref 38.5–50.0)
Hemoglobin: 10.5 g/dL — ABNORMAL LOW (ref 13.2–17.1)
Lymphs Abs: 2441 cells/uL (ref 850–3900)
MCH: 23.8 pg — AB (ref 27.0–33.0)
MCHC: 31.3 g/dL — ABNORMAL LOW (ref 32.0–36.0)
MCV: 76 fL — ABNORMAL LOW (ref 80.0–100.0)
MONOS PCT: 13.2 %
NEUTROS ABS: 6479 {cells}/uL (ref 1500–7800)
Neutrophils Relative %: 62.9 %
RBC: 4.42 10*6/uL (ref 4.20–5.80)
RDW: 23.2 % — ABNORMAL HIGH (ref 11.0–15.0)
Total Lymphocyte: 23.7 %
WBC mixed population: 1360 cells/uL — ABNORMAL HIGH (ref 200–950)
WBC: 10.3 10*3/uL (ref 3.8–10.8)

## 2017-08-15 MED ORDER — LEVOFLOXACIN 250 MG PO TABS
250.0000 mg | ORAL_TABLET | Freq: Every day | ORAL | 0 refills | Status: DC
Start: 2017-08-15 — End: 2017-10-26

## 2017-08-15 MED ORDER — CEFTRIAXONE SODIUM 1 G IJ SOLR
1.0000 g | Freq: Once | INTRAMUSCULAR | Status: AC
  Administered 2017-08-15: 1 g via INTRAMUSCULAR

## 2017-08-15 NOTE — Addendum Note (Signed)
Addended by: Shary Decamp B on: 08/15/2017 04:10 PM   Modules accepted: Orders

## 2017-08-15 NOTE — Progress Notes (Signed)
Subjective:    Patient ID: Ricardo Watkins, male    DOB: May 11, 1931, 84 y.o.   MRN: [id number]  HPI  Patient is an 84 year old white male with a very complicated past medical history including cancer (NHL), coronary artery disease, pneumonia, and general declining health.  He presents with general weakness over the last 3-4 days, worsening cough, and subjective fevers.  Wife reports decreased appetite.  He reports declining strength and generally getting weaker.  Vital signs are stable.  However on his exam, he has prominent left basilar rales both heard in the axillary line and posteriorly.  These are not usually present and suggest possible walking pneumonia Past Medical History:  Diagnosis Date  . Adenomatous colon polyp   . CAD (coronary artery disease)    30% LAD Stenosis, 70% ramus intermedius stenosis, treated with PTCA and angioplasty by Dr Albertine Patricia 2004  . Cataract    right eye  . Cough, persistent 11/04/2015  . Deficiency anemia 04/19/2014  . Diverticulosis   . DM (diabetes mellitus) ([place])   . DVT (deep venous thrombosis) ([place])    secondary to surgery  . Dyslipidemia   . Hyperlipidemia   . Hypertension   . Inguinal hernia    right  . 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 Microcytic anemia 07/07/2015  . 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 Non Hodgkin's lymphoma ([place])   . Pleural effusion, left 11/04/2015  . Prostate CA ([place]) 09/10/2011   Gleason 3+3 R, 3+4 L lobe May 2007 Rx Radioactive seed implants Dr [name]  . PVD (peripheral vascular disease) ([place])    rt renal artery stent  . Renal insufficiency   . Thrombocytopenia ([place])   . Thrombotic stroke ([place]) 09/10/2011   January 18, 2011 infarct genu & post limb R internal capsule - acute; previous lacunar infarcts/extensive white matter dis  . Thyroid nodule    left lower lobe (annual monitoring).  . Vitamin D deficiency    Past Surgical History:  Procedure Laterality Date  .  APPENDECTOMY     patient ?  Marland Kitchen CARDIAC CATHETERIZATION    . CHEST TUBE INSERTION Left 02/10/2016   Procedure: INSERTION PLEURAL DRAINAGE CATHETER;  Surgeon: [name], MD;  Location: [facility];  Service: Thoracic;  Laterality: Left;  . CHEST TUBE INSERTION Left 04/08/2016   Procedure: CHEST TUBE INSERTION;  Surgeon: [name], MD;  Location: [facility];  Service: Thoracic;  Laterality: Left;  . COLONOSCOPY    . CORONARY ANGIOPLASTY    . CYSTOURETHROSCOPY     ROBOTIC ARM NUCLETRON SEED IMPLANTATION OF PROSTATE  . EXPLORATORY LAPAROTOMY     For evaluation of lymphoma  . REMOVAL OF PLEURAL DRAINAGE CATHETER Left 04/08/2016   Procedure: REMOVAL OF PLEURAL DRAINAGE CATHETER;  Surgeon: [name], MD;  Location[facility];  Service: Thoracic;  Laterality: Left;   Current Outpatient Medications on File Prior to Visit  Medication Sig Dispense Refill  . acetaminophen (TYLENOL) 500 MG tablet Take 1,000 mg by mouth every 6 (six) hours as needed for mild pain, moderate pain or headache.     . clopidogrel (PLAVIX) 75 MG tablet TAKE 1 TABLET BY MOUTH ONCE A DAY WITH BREAKFAST 90 tablet 3  . cyanocobalamin (,VITAMIN B-12,) 1000 MCG/ML injection Inject 1 mL (1,000 mcg total) into the muscle every 30 (thirty) days. 10 mL 3  . furosemide (LASIX) 40 MG tablet TAKE 1 TABLET BY MOUTH ONCE A  DAY 90 tablet 3  . glucose blood test strip Check BS TID 300 each 3  . hydrocortisone cream 1 % Apply 1 application topically at bedtime as needed for itching.    . Insulin Glargine (LANTUS SOLOSTAR) 100 UNIT/ML Solostar Pen INJECT 25 UNITS EVERY NIGHT AT BEDTIME (Patient taking differently: Inject 30 Units into the skin daily at 10 pm. INJECT 25 UNITS EVERY NIGHT AT BEDTIME) 5 pen 3  . insulin lispro (HUMALOG) 100 UNIT/ML KiwkPen Inject 5 units Lunch & Dinner only (Patient taking differently: Inject 10 Units into the skin 2 (two) times daily. Inject 5 units Lunch & Dinner only) 15 mL 3  . LANTUS SOLOSTAR 100 UNIT/ML  Solostar Pen INJECT 25 UNITS EVERY NIGHT AT BEDTIME AS DIRECTED 45 mL 1  . meclizine (ANTIVERT) 12.5 MG tablet Take 1 tablet (12.5 mg total) by mouth 3 (three) times daily as needed for dizziness. 30 tablet 0  . metoCLOPramide (REGLAN) 10 MG tablet Take 1 tablet (10 mg total) by mouth 4 (four) times daily. 30 tablet 0  . metoprolol succinate (TOPROL-XL) 100 MG 24 hr tablet TAKE 1 TABLET BY MOUTH ONCE A DAY 90 tablet 3  . Multiple Vitamin (MULTIVITAMIN WITH MINERALS) TABS tablet Take 1 tablet by mouth at bedtime.    Marland Kitchen OVER THE COUNTER MEDICATION Place 1 drop into both eyes daily as needed (dry eyes). Over the counter lubricating eye drop    . SYRINGE-NEEDLE, DISP, 3 ML (LUER LOCK SAFETY SYRINGES) 25G X 1" 3 ML MISC Use with B 12 injections q month 100 each 3  . tamsulosin (FLOMAX) 0.4 MG CAPS capsule TAKE 1 CAPSULE BY MOUTH ONCE DAILY 90 capsule 3  . traMADol (ULTRAM) 50 MG tablet Take 1 tablet (50 mg total) by mouth every 8 (eight) hours as needed. 30 tablet 2  . vitamin C (ASCORBIC ACID) 500 MG tablet Take 500 mg by mouth daily.     Current Facility-Administered Medications on File Prior to Visit  Medication Dose Route Frequency Provider Last Rate Last Dose  . cyanocobalamin ((VITAMIN B-12)) injection 1,000 mcg  1,000 mcg Subcutaneous Q30 days [name], MD   1,000 mcg at 07/06/16 1430  . romiPLOStim (NPLATE) injection 170 mcg  170 mcg Subcutaneous Weekly [name], Ni, MD   170 mcg at 11/25/16 1345   Allergies  Allergen Reactions  . Glucophage [Metformin Hydrochloride] Other (See Comments)    Chest pain  . Zetia [Ezetimibe] Other (See Comments)    weakness  . Fenofibrate Rash  . Niacin-Lovastatin Er Rash   Social History   Socioeconomic History  . Marital status: Married    Spouse name: Not on file  . Number of children: 4  . Years of education: Not on file  . Highest education level: Not on file  Social Needs  . Financial resource strain: Not on file  . Food insecurity -  worry: Not on file  . Food insecurity - inability: Not on file  . Transportation needs - medical: Not on file  . Transportation needs - non-medical: Not on file  Occupational History  . Occupation: retired  Tobacco Use  . Smoking status: Former Smoker    Packs/day: 0.50    Years: 20.00    Pack years: 10.00    Types: Pipe, Cigarettes    Last attempt to quit: 06/21/1958    Years since quitting: 59.1  . Smokeless tobacco: Never Used  Substance and Sexual Activity  . Alcohol use: No    Alcohol/week:  0.0 oz  . Drug use: No  . Sexual activity: Yes    Partners: Female  Other Topics Concern  . Not on file  Social History Narrative  . Not on file      Review of Systems  All other systems reviewed and are negative.      Objective:   Physical Exam  Constitutional: He appears well-developed.  Non-toxic appearance. He has a sickly appearance. He does not appear ill. No distress.  Neck: Neck supple. No JVD present.  Cardiovascular: Normal rate and normal heart sounds.  Pulmonary/Chest: Effort normal and breath sounds normal. No respiratory distress. He has no wheezes. He has no rales.  Abdominal: Soft. Bowel sounds are normal. He exhibits no distension. There is no tenderness. There is no rebound.  Musculoskeletal: He exhibits edema.  Lymphadenopathy:    He has no cervical adenopathy.  Skin: He is not diaphoretic.  Vitals reviewed.         Assessment & Plan:  Fever, unspecified fever cause - Plan: CBC with Differential/Platelet, COMPLETE METABOLIC PANEL WITH GFR  Walking pneumonia  Patient appears to be developing walking pneumonia.  Previously has a history of empyema on the same side however CT scan obtained in January revealed no scar tissue or atelectasis in that area to explain the sounds I am hearing today on exam.  Therefore I will treat the patient is walking pneumonia with Rocephin 1 g IM x1 now and then begin Levaquin 250 mg p.o. daily for 7 days.  This is renally  dosed based on his chronic kidney disease.  Reassess the patient in 48 hours.  Encourage the family to push fluids.  If the patient is worsening, he needs to go directly to the hospital.

## 2017-08-18 ENCOUNTER — Inpatient Hospital Stay: Payer: Medicare Other

## 2017-08-18 ENCOUNTER — Ambulatory Visit (INDEPENDENT_AMBULATORY_CARE_PROVIDER_SITE_OTHER): Payer: Medicare Other | Admitting: Family Medicine

## 2017-08-18 VITALS — BP 130/74 | HR 66 | Temp 97.5°F | Resp 16 | Ht 69.0 in | Wt 190.0 lb

## 2017-08-18 VITALS — BP 131/58 | HR 77 | Temp 97.9°F | Resp 18

## 2017-08-18 DIAGNOSIS — D46C Myelodysplastic syndrome with isolated del(5q) chromosomal abnormality: Secondary | ICD-10-CM

## 2017-08-18 DIAGNOSIS — M25512 Pain in left shoulder: Secondary | ICD-10-CM

## 2017-08-18 DIAGNOSIS — D631 Anemia in chronic kidney disease: Secondary | ICD-10-CM

## 2017-08-18 DIAGNOSIS — J189 Pneumonia, unspecified organism: Secondary | ICD-10-CM

## 2017-08-18 DIAGNOSIS — D539 Nutritional anemia, unspecified: Secondary | ICD-10-CM

## 2017-08-18 DIAGNOSIS — D693 Immune thrombocytopenic purpura: Secondary | ICD-10-CM

## 2017-08-18 DIAGNOSIS — M25511 Pain in right shoulder: Secondary | ICD-10-CM

## 2017-08-18 DIAGNOSIS — G8929 Other chronic pain: Secondary | ICD-10-CM | POA: Diagnosis not present

## 2017-08-18 DIAGNOSIS — D696 Thrombocytopenia, unspecified: Secondary | ICD-10-CM

## 2017-08-18 DIAGNOSIS — N183 Chronic kidney disease, stage 3 unspecified: Secondary | ICD-10-CM

## 2017-08-18 LAB — CBC WITH DIFFERENTIAL/PLATELET
BASOS ABS: 0 10*3/uL (ref 0.0–0.1)
Basophils Relative: 0 %
EOS ABS: 0 10*3/uL (ref 0.0–0.5)
Eosinophils Relative: 0 %
HCT: 32 % — ABNORMAL LOW (ref 38.4–49.9)
HEMOGLOBIN: 9.4 g/dL — AB (ref 13.0–17.1)
LYMPHS ABS: 1.8 10*3/uL (ref 0.9–3.3)
LYMPHS PCT: 40 %
MCH: 23.7 pg — AB (ref 27.2–33.4)
MCHC: 29.4 g/dL — ABNORMAL LOW (ref 32.0–36.0)
MCV: 80.6 fL (ref 79.3–98.0)
Monocytes Absolute: 0.9 10*3/uL (ref 0.1–0.9)
Monocytes Relative: 19 %
NEUTROS PCT: 41 %
Neutro Abs: 1.8 10*3/uL (ref 1.5–6.5)
Platelets: 92 10*3/uL — ABNORMAL LOW (ref 140–400)
RBC: 3.97 MIL/uL — AB (ref 4.20–5.82)
RDW: 25.7 % — ABNORMAL HIGH (ref 11.0–14.6)
WBC: 4.5 10*3/uL (ref 4.0–10.3)

## 2017-08-18 MED ORDER — DARBEPOETIN ALFA 200 MCG/0.4ML IJ SOSY
200.0000 ug | PREFILLED_SYRINGE | Freq: Once | INTRAMUSCULAR | Status: AC
  Administered 2017-08-18: 200 ug via SUBCUTANEOUS

## 2017-08-18 MED ORDER — GLUCOSE BLOOD VI STRP: ORAL_STRIP | 3 refills | Status: DC

## 2017-08-18 MED ORDER — TRAMADOL HCL 50 MG PO TABS: 50.0000 mg | ORAL_TABLET | Freq: Four times a day (QID) | ORAL | 2 refills | Status: DC | PRN

## 2017-08-18 MED ORDER — ROMIPLOSTIM 250 MCG ~~LOC~~ SOLR
250.0000 ug | SUBCUTANEOUS | Status: DC
  Administered 2017-08-18: 250 ug via SUBCUTANEOUS
  Filled 2017-08-18: qty 0.5

## 2017-08-18 NOTE — Patient Instructions (Signed)
Romiplostim injection What is this medicine? ROMIPLOSTIM (roe mi PLOE stim) helps your body make more platelets. This medicine is used to treat low platelets caused by chronic idiopathic thrombocytopenic purpura (ITP). This medicine may be used for other purposes; ask your health care provider or pharmacist if you have questions. COMMON BRAND NAME(S): Nplate What should I tell my health care provider before I take this medicine? They need to know if you have any of these conditions: -cancer or myelodysplastic syndrome -low blood counts, like low white cell, platelet, or red cell counts -take medicines that treat or prevent blood clots -an unusual or allergic reaction to romiplostim, mannitol, other medicines, foods, dyes, or preservatives -pregnant or trying to get pregnant -breast-feeding How should I use this medicine? This medicine is for injection under the skin. It is given by a health care professional in a hospital or clinic setting. A special MedGuide will be given to you before your injection. Read this information carefully each time. Talk to your pediatrician regarding the use of this medicine in children. Special care may be needed. Overdosage: If you think you have taken too much of this medicine contact a poison control center or emergency room at once. NOTE: This medicine is only for you. Do not share this medicine with others. What if I miss a dose? It is important not to miss your dose. Call your doctor or health care professional if you are unable to keep an appointment. What may interact with this medicine? Interactions are not expected. This list may not describe all possible interactions. Give your health care provider a list of all the medicines, herbs, non-prescription drugs, or dietary supplements you use. Also tell them if you smoke, drink alcohol, or use illegal drugs. Some items may interact with your medicine. What should I watch for while using this  medicine? Your condition will be monitored carefully while you are receiving this medicine. Visit your prescriber or health care professional for regular checks on your progress and for the needed blood tests. It is important to keep all appointments. What side effects may I notice from receiving this medicine? Side effects that you should report to your doctor or health care professional as soon as possible: -allergic reactions like skin rash, itching or hives, swelling of the face, lips, or tongue -shortness of breath, chest pain, swelling in a leg -unusual bleeding or bruising Side effects that usually do not require medical attention (report to your doctor or health care professional if they continue or are bothersome): -dizziness -headache -muscle aches -pain in arms and legs -stomach pain -trouble sleeping This list may not describe all possible side effects. Call your doctor for medical advice about side effects. You may report side effects to FDA at 1-800-FDA-1088. Where should I keep my medicine? This drug is given in a hospital or clinic and will not be stored at home. NOTE: This sheet is a summary. It may not cover all possible information. If you have questions about this medicine, talk to your doctor, pharmacist, or health care provider.  2018 Elsevier/Gold Standard (2008-02-05 15:13:04)   Darbepoetin Alfa injection What is this medicine? DARBEPOETIN ALFA (dar be POE e tin AL fa) helps your body make more red blood cells. It is used to treat anemia caused by chronic kidney failure and chemotherapy. This medicine may be used for other purposes; ask your health care provider or pharmacist if you have questions. COMMON BRAND NAME(S): Aranesp What should I tell my health care provider   before I take this medicine? They need to know if you have any of these conditions: -blood clotting disorders or history of blood clots -cancer patient not on chemotherapy -cystic  fibrosis -heart disease, such as angina, heart failure, or a history of a heart attack -hemoglobin level of 12 g/dL or greater -high blood pressure -low levels of folate, iron, or vitamin B12 -seizures -an unusual or allergic reaction to darbepoetin, erythropoietin, albumin, hamster proteins, latex, other medicines, foods, dyes, or preservatives -pregnant or trying to get pregnant -breast-feeding How should I use this medicine? This medicine is for injection into a vein or under the skin. It is usually given by a health care professional in a hospital or clinic setting. If you get this medicine at home, you will be taught how to prepare and give this medicine. Use exactly as directed. Take your medicine at regular intervals. Do not take your medicine more often than directed. It is important that you put your used needles and syringes in a special sharps container. Do not put them in a trash can. If you do not have a sharps container, call your pharmacist or healthcare provider to get one. A special MedGuide will be given to you by the pharmacist with each prescription and refill. Be sure to read this information carefully each time. Talk to your pediatrician regarding the use of this medicine in children. While this medicine may be used in children as young as 1 year for selected conditions, precautions do apply. Overdosage: If you think you have taken too much of this medicine contact a poison control center or emergency room at once. NOTE: This medicine is only for you. Do not share this medicine with others. What if I miss a dose? If you miss a dose, take it as soon as you can. If it is almost time for your next dose, take only that dose. Do not take double or extra doses. What may interact with this medicine? Do not take this medicine with any of the following medications: -epoetin alfa This list may not describe all possible interactions. Give your health care provider a list of all the  medicines, herbs, non-prescription drugs, or dietary supplements you use. Also tell them if you smoke, drink alcohol, or use illegal drugs. Some items may interact with your medicine. What should I watch for while using this medicine? Your condition will be monitored carefully while you are receiving this medicine. You may need blood work done while you are taking this medicine. What side effects may I notice from receiving this medicine? Side effects that you should report to your doctor or health care professional as soon as possible: -allergic reactions like skin rash, itching or hives, swelling of the face, lips, or tongue -breathing problems -changes in vision -chest pain -confusion, trouble speaking or understanding -feeling faint or lightheaded, falls -high blood pressure -muscle aches or pains -pain, swelling, warmth in the leg -rapid weight gain -severe headaches -sudden numbness or weakness of the face, arm or leg -trouble walking, dizziness, loss of balance or coordination -seizures (convulsions) -swelling of the ankles, feet, hands -unusually weak or tired Side effects that usually do not require medical attention (report to your doctor or health care professional if they continue or are bothersome): -diarrhea -fever, chills (flu-like symptoms) -headaches -nausea, vomiting -redness, stinging, or swelling at site where injected This list may not describe all possible side effects. Call your doctor for medical advice about side effects. You may report side effects to   FDA at 1-800-FDA-1088. Where should I keep my medicine? Keep out of the reach of children. Store in a refrigerator between 2 and 8 degrees C (36 and 46 degrees F). Do not freeze. Do not shake. Throw away any unused portion if using a single-dose vial. Throw away any unused medicine after the expiration date. NOTE: This sheet is a summary. It may not cover all possible information. If you have questions about  this medicine, talk to your doctor, pharmacist, or health care provider.  2018 Elsevier/Gold Standard (2016-01-26 19:52:26)   

## 2017-08-18 NOTE — Progress Notes (Signed)
Subjective:    Patient ID: Ricardo Watkins, male    DOB: September 02, 1930, 84 y.o.   MRN: [id number]  HPI 08/15/17 Patient is an 84 year old white male with a very complicated past medical history including cancer (NHL), coronary artery disease, pneumonia, and general declining health.  He presents with general weakness over the last 3-4 days, worsening cough, and subjective fevers.  Wife reports decreased appetite.  He reports declining strength and generally getting weaker.  Vital signs are stable.  However on his exam, he has prominent left basilar rales both heard in the axillary line and posteriorly.  These are not usually present and suggest possible walking pneumonia.  At that time, my plan was: Patient appears to be developing walking pneumonia.  Previously has a history of empyema on the same side however CT scan obtained in January revealed no scar tissue or atelectasis in that area to explain the sounds I am hearing today on exam.  Therefore I will treat the patient is walking pneumonia with Rocephin 1 g IM x1 now and then begin Levaquin 250 mg p.o. daily for 7 days.  This is renally dosed based on his chronic kidney disease.  Reassess the patient in 48 hours.  Encourage the family to push fluids.  If the patient is worsening, he needs to go directly to the hospital.  08/18/17 Patient is here today for recheck.  Patient states he is feeling much better.  He has not had a fever in 24 hours.  His cough has improved.  His fatigue has improved.  His appetite is better.  He denies any shortness of breath or chest pain.  He is seemingly improving and feels like a new.  He has experienced some hypoglycemia in the morning but is not taking a snack at night.  He also complains of bilateral shoulder pain which is a chronic orthopedic condition.  The tramadol helps but does not last sufficiently long enough Past Medical History:  Diagnosis Date  . Adenomatous colon polyp   . CAD (coronary artery disease)    30% LAD Stenosis, 70% ramus intermedius stenosis, treated with PTCA and angioplasty by Dr Albertine Patricia 2004  . Cataract    right eye  . Cough, persistent 11/04/2015  . Deficiency anemia 04/19/2014  . Diverticulosis   . DM (diabetes mellitus) ([place])   . DVT (deep venous thrombosis) ([place])    secondary to surgery  . Dyslipidemia   . Hyperlipidemia   . Hypertension   . Inguinal hernia    right  . 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 Microcytic anemia 07/07/2015  . 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 Non Hodgkin's lymphoma ([place])   . Pleural effusion, left 11/04/2015  . Prostate CA ([place]) 09/10/2011   Gleason 3+3 R, 3+4 L lobe May 2007 Rx Radioactive seed implants Dr [name]  . PVD (peripheral vascular disease) ([place])    rt renal artery stent  . Renal insufficiency   . Thrombocytopenia ([place])   . 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 Thyroid nodule    left lower lobe (annual monitoring).  . Vitamin D deficiency    Past Surgical History:  Procedure Laterality Date  . APPENDECTOMY     patient ?  Marland Kitchen CARDIAC CATHETERIZATION    . CHEST TUBE INSERTION Left 02/10/2016   Procedure: INSERTION  PLEURAL DRAINAGE CATHETER;  Surgeon: [name], MD;  Location: [place];  Service: Thoracic;  Laterality: Left;  . CHEST TUBE INSERTION Left 04/08/2016   Procedure: CHEST TUBE INSERTION;  Surgeon: [name], MD;  Location: [place];  Service: Thoracic;  Laterality: Left;  . COLONOSCOPY    . CORONARY ANGIOPLASTY    . CYSTOURETHROSCOPY     ROBOTIC ARM NUCLETRON SEED IMPLANTATION OF PROSTATE  . EXPLORATORY LAPAROTOMY     For evaluation of lymphoma  . REMOVAL OF PLEURAL DRAINAGE CATHETER Left 04/08/2016   Procedure: REMOVAL OF PLEURAL DRAINAGE CATHETER;  Surgeon: [name], MD;  Location[facility] OR;  Service: Thoracic;  Laterality: Left;    Current Outpatient Medications on File Prior to Visit  Medication Sig Dispense Refill  . acetaminophen (TYLENOL) 500 MG tablet Take 1,000 mg by mouth every 6 (six) hours as needed for mild pain, moderate pain or headache.     . clopidogrel (PLAVIX) 75 MG tablet TAKE 1 TABLET BY MOUTH ONCE A DAY WITH BREAKFAST 90 tablet 3  . cyanocobalamin (,VITAMIN B-12,) 1000 MCG/ML injection Inject 1 mL (1,000 mcg total) into the muscle every 30 (thirty) days. 10 mL 3  . furosemide (LASIX) 40 MG tablet TAKE 1 TABLET BY MOUTH ONCE A DAY 90 tablet 3  . glucose blood test strip Check BS TID 300 each 3  . hydrocortisone cream 1 % Apply 1 application topically at bedtime as needed for itching.    . Insulin Glargine (LANTUS SOLOSTAR) 100 UNIT/ML Solostar Pen INJECT 25 UNITS EVERY NIGHT AT BEDTIME (Patient taking differently: Inject 30 Units into the skin daily at 10 pm. INJECT 25 UNITS EVERY NIGHT AT BEDTIME) 5 pen 3  . insulin lispro (HUMALOG) 100 UNIT/ML KiwkPen Inject 5 units Lunch & Dinner only (Patient taking differently: Inject 10 Units into the skin 2 (two) times daily. Inject 5 units Lunch & Dinner only) 15 mL 3  . LANTUS SOLOSTAR 100 UNIT/ML Solostar Pen INJECT 25 UNITS EVERY NIGHT AT BEDTIME AS DIRECTED 45 mL 1  . levofloxacin (LEVAQUIN) 250 MG tablet Take 1 tablet (250 mg total) by mouth daily. 7 tablet 0  . meclizine (ANTIVERT) 12.5 MG tablet Take 1 tablet (12.5 mg total) by mouth 3 (three) times daily as needed for dizziness. 30 tablet 0  . metoCLOPramide (REGLAN) 10 MG tablet Take 1 tablet (10 mg total) by mouth 4 (four) times daily. 30 tablet 0  . metoprolol succinate (TOPROL-XL) 100 MG 24 hr tablet TAKE 1 TABLET BY MOUTH ONCE A DAY 90 tablet 3  . Multiple Vitamin (MULTIVITAMIN WITH MINERALS) TABS tablet Take 1 tablet by mouth at bedtime.    Marland Kitchen OVER THE COUNTER MEDICATION Place 1 drop into both eyes daily as needed (dry eyes). Over the counter lubricating eye drop    . SYRINGE-NEEDLE, DISP, 3 ML (LUER  LOCK SAFETY SYRINGES) 25G X 1" 3 ML MISC Use with B 12 injections q month 100 each 3  . tamsulosin (FLOMAX) 0.4 MG CAPS capsule TAKE 1 CAPSULE BY MOUTH ONCE DAILY 90 capsule 3  . traMADol (ULTRAM) 50 MG tablet Take 1 tablet (50 mg total) by mouth every 8 (eight) hours as needed. 30 tablet 2  . vitamin C (ASCORBIC ACID) 500 MG tablet Take 500 mg by mouth daily.     Current Facility-Administered Medications on File Prior to Visit  Medication Dose Route Frequency Provider Last Rate Last Dose  . cyanocobalamin ((VITAMIN B-12)) injection 1,000 mcg  1,000 mcg  Subcutaneous Q30 days [name], MD   1,000 mcg at 07/06/16 1430  . romiPLOStim (NPLATE) injection 170 mcg  170 mcg Subcutaneous Weekly [name], Ni, MD   170 mcg at 11/25/16 1345   Allergies  Allergen Reactions  . Glucophage [Metformin Hydrochloride] Other (See Comments)    Chest pain  . Zetia [Ezetimibe] Other (See Comments)    weakness  . Fenofibrate Rash  . Niacin-Lovastatin Er Rash   Social History   Socioeconomic History  . Marital status: Married    Spouse name: Not on file  . Number of children: 4  . Years of education: Not on file  . Highest education level: Not on file  Social Needs  . Financial resource strain: Not on file  . Food insecurity - worry: Not on file  . Food insecurity - inability: Not on file  . Transportation needs - medical: Not on file  . Transportation needs - non-medical: Not on file  Occupational History  . Occupation: retired  Tobacco Use  . Smoking status: Former Smoker    Packs/day: 0.50    Years: 20.00    Pack years: 10.00    Types: Pipe, Cigarettes    Last attempt to quit: 06/21/1958    Years since quitting: 59.2  . Smokeless tobacco: Never Used  Substance and Sexual Activity  . Alcohol use: No    Alcohol/week: 0.0 oz  . Drug use: No  . Sexual activity: Yes    Partners: Female  Other Topics Concern  . Not on file  Social History Narrative  . Not on file      Review of  Systems  All other systems reviewed and are negative.      Objective:   Physical Exam  Constitutional: He appears well-developed.  Non-toxic appearance. He has a sickly appearance. He does not appear ill. No distress.  Neck: Neck supple. No JVD present.  Cardiovascular: Normal rate and normal heart sounds.  Pulmonary/Chest: Effort normal and breath sounds normal. No respiratory distress. He has no wheezes. He has no rales.  Abdominal: Soft. Bowel sounds are normal. He exhibits no distension. There is no tenderness. There is no rebound.  Musculoskeletal: He exhibits edema.  Lymphadenopathy:    He has no cervical adenopathy.  Skin: He is not diaphoretic.  Vitals reviewed.         Assessment & Plan:  Walking pneumonia  Chronic pain of both shoulders  Patient looks much better than his previous office visit.  Complete the Levaquin.  Increase tramadol to 50 mg every 6 hours for shoulder pain.  Return next week and we will perform cortisone injections in both shoulders once he is completed treatment for his pneumonia.  Begin eating a snack at night to prevent a.m. hypoglycemia.  Shoulder pain began long before he started Levaquin.

## 2017-08-18 NOTE — Addendum Note (Signed)
Addended by: Shary Decamp B on: 08/18/2017 12:36 PM   Modules accepted: Orders

## 2017-08-25 ENCOUNTER — Inpatient Hospital Stay: Payer: Medicare Other | Attending: Hematology and Oncology | Admitting: Hematology and Oncology

## 2017-08-25 ENCOUNTER — Inpatient Hospital Stay: Payer: Medicare Other

## 2017-08-25 ENCOUNTER — Telehealth: Payer: Self-pay | Admitting: Hematology and Oncology

## 2017-08-25 ENCOUNTER — Encounter: Payer: Self-pay | Admitting: Hematology and Oncology

## 2017-08-25 VITALS — BP 159/70 | HR 80 | Temp 97.4°F | Resp 18 | Ht 69.0 in | Wt 187.7 lb

## 2017-08-25 DIAGNOSIS — D46C Myelodysplastic syndrome with isolated del(5q) chromosomal abnormality: Secondary | ICD-10-CM | POA: Diagnosis not present

## 2017-08-25 DIAGNOSIS — D696 Thrombocytopenia, unspecified: Secondary | ICD-10-CM

## 2017-08-25 DIAGNOSIS — D509 Iron deficiency anemia, unspecified: Secondary | ICD-10-CM

## 2017-08-25 DIAGNOSIS — D539 Nutritional anemia, unspecified: Secondary | ICD-10-CM

## 2017-08-25 DIAGNOSIS — E538 Deficiency of other specified B group vitamins: Secondary | ICD-10-CM | POA: Diagnosis not present

## 2017-08-25 DIAGNOSIS — D693 Immune thrombocytopenic purpura: Secondary | ICD-10-CM

## 2017-08-25 DIAGNOSIS — N183 Chronic kidney disease, stage 3 unspecified: Secondary | ICD-10-CM

## 2017-08-25 DIAGNOSIS — D631 Anemia in chronic kidney disease: Secondary | ICD-10-CM

## 2017-08-25 LAB — CBC WITH DIFFERENTIAL/PLATELET
BAND NEUTROPHILS: 0 %
BLASTS: 0 %
Basophils Absolute: 0 10*3/uL (ref 0.0–0.1)
Basophils Relative: 0 %
EOS ABS: 0 10*3/uL (ref 0.0–0.5)
EOS PCT: 0 %
HEMATOCRIT: 33.9 % — AB (ref 38.4–49.9)
Hemoglobin: 10.2 g/dL — ABNORMAL LOW (ref 13.0–17.1)
LYMPHS ABS: 2.1 10*3/uL (ref 0.9–3.3)
LYMPHS PCT: 45 %
MCH: 23.4 pg — ABNORMAL LOW (ref 27.2–33.4)
MCHC: 30.2 g/dL — AB (ref 32.0–36.0)
MCV: 77.5 fL — ABNORMAL LOW (ref 79.3–98.0)
Metamyelocytes Relative: 0 %
Monocytes Absolute: 1.2 10*3/uL — ABNORMAL HIGH (ref 0.1–0.9)
Monocytes Relative: 27 %
Myelocytes: 0 %
NEUTROS ABS: 1.3 10*3/uL — AB (ref 1.5–6.5)
Neutrophils Relative %: 28 %
OTHER: 0 %
Platelets: 234 10*3/uL (ref 140–400)
Promyelocytes Absolute: 0 %
RBC: 4.38 MIL/uL (ref 4.20–5.82)
RDW: 28.3 % — AB (ref 11.0–14.6)
WBC: 4.6 10*3/uL (ref 4.0–10.3)
nRBC: 0 /100 WBC

## 2017-08-25 MED ORDER — ROMIPLOSTIM 250 MCG ~~LOC~~ SOLR
250.0000 ug | SUBCUTANEOUS | Status: DC
  Administered 2017-08-25: 250 ug via SUBCUTANEOUS
  Filled 2017-08-25: qty 0.5

## 2017-08-25 NOTE — Assessment & Plan Note (Signed)
He has remarkable response to Nplate injection.  He has recent resolution of neutropenia, improvement of anemia as well as improvement of thrombocytopenia. He will continue monthly vitamin B12 injection along with Nplate injection today. I will see him back in 6 months. The goal of injection is to keep platelet count greater than 50,000 He will get darbepoetin injection to keep hemoglobin greater than 10 With recent microcytosis, I will check iron studies

## 2017-08-25 NOTE — Assessment & Plan Note (Signed)
I am surprised to see that his platelet count is good today He will continue weekly injection as needed based on his platelet count 

## 2017-08-25 NOTE — Patient Instructions (Signed)
Romiplostim injection What is this medicine? ROMIPLOSTIM (roe mi PLOE stim) helps your body make more platelets. This medicine is used to treat low platelets caused by chronic idiopathic thrombocytopenic purpura (ITP). This medicine may be used for other purposes; ask your health care provider or pharmacist if you have questions. COMMON BRAND NAME(S): Nplate What should I tell my health care provider before I take this medicine? They need to know if you have any of these conditions: -cancer or myelodysplastic syndrome -low blood counts, like low white cell, platelet, or red cell counts -take medicines that treat or prevent blood clots -an unusual or allergic reaction to romiplostim, mannitol, other medicines, foods, dyes, or preservatives -pregnant or trying to get pregnant -breast-feeding How should I use this medicine? This medicine is for injection under the skin. It is given by a health care professional in a hospital or clinic setting. A special MedGuide will be given to you before your injection. Read this information carefully each time. Talk to your pediatrician regarding the use of this medicine in children. Special care may be needed. Overdosage: If you think you have taken too much of this medicine contact a poison control center or emergency room at once. NOTE: This medicine is only for you. Do not share this medicine with others. What if I miss a dose? It is important not to miss your dose. Call your doctor or health care professional if you are unable to keep an appointment. What may interact with this medicine? Interactions are not expected. This list may not describe all possible interactions. Give your health care provider a list of all the medicines, herbs, non-prescription drugs, or dietary supplements you use. Also tell them if you smoke, drink alcohol, or use illegal drugs. Some items may interact with your medicine. What should I watch for while using this  medicine? Your condition will be monitored carefully while you are receiving this medicine. Visit your prescriber or health care professional for regular checks on your progress and for the needed blood tests. It is important to keep all appointments. What side effects may I notice from receiving this medicine? Side effects that you should report to your doctor or health care professional as soon as possible: -allergic reactions like skin rash, itching or hives, swelling of the face, lips, or tongue -shortness of breath, chest pain, swelling in a leg -unusual bleeding or bruising Side effects that usually do not require medical attention (report to your doctor or health care professional if they continue or are bothersome): -dizziness -headache -muscle aches -pain in arms and legs -stomach pain -trouble sleeping This list may not describe all possible side effects. Call your doctor for medical advice about side effects. You may report side effects to FDA at 1-800-FDA-1088. Where should I keep my medicine? This drug is given in a hospital or clinic and will not be stored at home. NOTE: This sheet is a summary. It may not cover all possible information. If you have questions about this medicine, talk to your doctor, pharmacist, or health care provider.  2018 Elsevier/Gold Standard (2008-02-05 15:13:04)  

## 2017-08-25 NOTE — Telephone Encounter (Signed)
Gave patient avs and calendar with appts per 3/7 los.  °

## 2017-08-25 NOTE — Assessment & Plan Note (Signed)
He had history of B12 deficiency However, insurance company has declined paying for B12 injection here His daughter felt that he could get B12 injection through his primary care doctor and will defer to them for further management

## 2017-08-25 NOTE — Progress Notes (Signed)
[facility] OFFICE PROGRESS NOTE  Patient Care Team: [name], MD as PCP - General (Family Medicine) Heath Lark, MD as Consulting Physician (Hematology and Oncology) [name], MD as Consulting Physician (Cardiology)  ASSESSMENT & PLAN:  MDS (myelodysplastic syndrome) with 5q deletion [facility]) He has remarkable response to Nplate injection.  He has recent resolution of neutropenia, improvement of anemia as well as improvement of thrombocytopenia. He will continue monthly vitamin B12 injection along with Nplate injection today. I will see him back in 6 months. The goal of injection is to keep platelet count greater than 50,000 He will get darbepoetin injection to keep hemoglobin greater than 10 With recent microcytosis, I will check iron studies  Thrombocytopenia ([place]) I am surprised to see that his platelet count is good today He will continue weekly injection as needed based on his platelet count  Vitamin B12 deficiency He had history of B12 deficiency However, insurance company has declined paying for B12 injection here His daughter felt that he could get B12 injection through his primary care doctor and will defer to them for further management   Orders Placed This Encounter  Procedures  . Ferritin    Standing Status:   Future    Standing Expiration Date:   09/29/2018  . Iron and TIBC    Standing Status:   Future    Standing Expiration Date:   09/29/2018    INTERVAL HISTORY: Please see below for problem oriented charting. He returns for further follow-up He was diagnosed with bronchitis/pneumonia recently and completed a course of antibiotic therapy He denies cough, chest pain or shortness of breath He has chronic bilateral lower extremity edema that has been stable The patient denies any recent signs or symptoms of bleeding such as spontaneous epistaxis, hematuria or hematochezia.   SUMMARY OF ONCOLOGIC HISTORY: Oncology History    Calculated R-IPSS score at low risk     MDS (myelodysplastic syndrome) with 5q deletion ([place])   11/07/2014 Bone Marrow Biopsy    Accession: [id number] marrow biopsy showed myelodysplastic syndrome with 5q deletion.      04/02/2015 - 07/01/2016 Chemotherapy    He started taking Promacta daily for thrombocytopenia, stopped due to lack of response      07/15/2016 Miscellaneous    Starting July 15, 2016, he is receiving Nplate injection along with darbepoetin       REVIEW OF SYSTEMS:   Constitutional: Denies fevers, chills or abnormal weight loss Eyes: Denies blurriness of vision Ears, nose, mouth, throat, and face: Denies mucositis or sore throat Respiratory: Denies cough, dyspnea or wheezes Cardiovascular: Denies palpitation, chest discomfort or lower extremity swelling Gastrointestinal:  Denies nausea, heartburn or change in bowel habits Skin: Denies abnormal skin rashes Lymphatics: Denies new lymphadenopathy  Neurological:Denies numbness, tingling or new weaknesses Behavioral/Psych: Mood is stable, no new changes  All other systems were reviewed with the patient and are negative.  I have reviewed the past medical history, past surgical history, social history and family history with the patient and they are unchanged from previous note.  ALLERGIES:  is allergic to glucophage [metformin hydrochloride]; zetia [ezetimibe]; fenofibrate; and niacin-lovastatin er.  MEDICATIONS:  Current Outpatient Medications  Medication Sig Dispense Refill  . acetaminophen (TYLENOL) 500 MG tablet Take 1,000 mg by mouth every 6 (six) hours as needed for mild pain, moderate pain or headache.     . clopidogrel (PLAVIX) 75 MG tablet TAKE 1 TABLET BY MOUTH ONCE A DAY WITH BREAKFAST 90 tablet 3  .  cyanocobalamin (,VITAMIN B-12,) 1000 MCG/ML injection Inject 1 mL (1,000 mcg total) into the muscle every 30 (thirty) days. 10 mL 3  . furosemide (LASIX) 40 MG tablet TAKE 1 TABLET BY MOUTH ONCE A DAY 90  tablet 3  . glucose blood test strip Check BS TID 300 each 3  . hydrocortisone cream 1 % Apply 1 application topically at bedtime as needed for itching.    . Insulin Glargine (LANTUS SOLOSTAR) 100 UNIT/ML Solostar Pen INJECT 25 UNITS EVERY NIGHT AT BEDTIME (Patient taking differently: Inject 30 Units into the skin daily at 10 pm. INJECT 25 UNITS EVERY NIGHT AT BEDTIME) 5 pen 3  . insulin lispro (HUMALOG) 100 UNIT/ML KiwkPen Inject 5 units Lunch & Dinner only (Patient taking differently: Inject 10 Units into the skin 2 (two) times daily. Inject 5 units Lunch & Dinner only) 15 mL 3  . LANTUS SOLOSTAR 100 UNIT/ML Solostar Pen INJECT 25 UNITS EVERY NIGHT AT BEDTIME AS DIRECTED 45 mL 1  . levofloxacin (LEVAQUIN) 250 MG tablet Take 1 tablet (250 mg total) by mouth daily. 7 tablet 0  . meclizine (ANTIVERT) 12.5 MG tablet Take 1 tablet (12.5 mg total) by mouth 3 (three) times daily as needed for dizziness. 30 tablet 0  . metoCLOPramide (REGLAN) 10 MG tablet Take 1 tablet (10 mg total) by mouth 4 (four) times daily. 30 tablet 0  . metoprolol succinate (TOPROL-XL) 100 MG 24 hr tablet TAKE 1 TABLET BY MOUTH ONCE A DAY 90 tablet 3  . Multiple Vitamin (MULTIVITAMIN WITH MINERALS) TABS tablet Take 1 tablet by mouth at bedtime.    Marland Kitchen OVER THE COUNTER MEDICATION Place 1 drop into both eyes daily as needed (dry eyes). Over the counter lubricating eye drop    . SYRINGE-NEEDLE, DISP, 3 ML (LUER LOCK SAFETY SYRINGES) 25G X 1" 3 ML MISC Use with B 12 injections q month 100 each 3  . tamsulosin (FLOMAX) 0.4 MG CAPS capsule TAKE 1 CAPSULE BY MOUTH ONCE DAILY 90 capsule 3  . traMADol (ULTRAM) 50 MG tablet Take 1 tablet (50 mg total) by mouth every 6 (six) hours as needed. 120 tablet 2  . vitamin C (ASCORBIC ACID) 500 MG tablet Take 500 mg by mouth daily.     Current Facility-Administered Medications  Medication Dose Route Frequency Provider Last Rate Last Dose  . cyanocobalamin ((VITAMIN B-12)) injection 1,000 mcg  1,000  mcg Subcutaneous Q30 days [name], MD   1,000 mcg at 07/06/16 1430   Facility-Administered Medications Ordered in Other Visits  Medication Dose Route Frequency Provider Last Rate Last Dose  . romiPLOStim (NPLATE) injection 170 mcg  170 mcg Subcutaneous Weekly [name], Adonna Horsley, MD   170 mcg at 11/25/16 1345  . romiPLOStim (NPLATE) injection 250 mcg  250 mcg Subcutaneous Weekly Rubby Barbary, MD   250 mcg at 08/25/17 1617    PHYSICAL EXAMINATION: ECOG PERFORMANCE STATUS: 2 - Symptomatic, <50% confined to bed  Vitals:   08/25/17 1457  BP: (!) 159/70  Pulse: 80  Resp: 18  Temp: (!) 97.4 F (36.3 C)  SpO2: 99%   Filed Weights   08/25/17 1457  Weight: 187 lb 11.2 oz (85.1 kg)    GENERAL:alert, no distress and comfortable.  He looks frail SKIN: he has significant bruises EYES: normal, Conjunctiva are pink and non-injected, sclera clear OROPHARYNX:no exudate, no erythema and lips, buccal mucosa, and tongue normal  NECK: supple, thyroid normal size, non-tender, without nodularity LYMPH:  no palpable lymphadenopathy in the cervical, axillary  or inguinal LUNGS: clear to auscultation and percussion with normal breathing effort HEART: regular rate & rhythm and no murmurs with moderate bilateral lower extremity edema ABDOMEN:abdomen soft, non-tender and normal bowel sounds Musculoskeletal:no cyanosis of digits and no clubbing  NEURO: alert & oriented x 3 with fluent speech, no focal motor/sensory deficits  LABORATORY DATA:  I have reviewed the data as listed    Component Value Date/Time   NA 137 08/15/2017 1133   NA 138 03/02/2016 1238   K 4.6 08/15/2017 1133   K 4.6 03/02/2016 1238   CL 101 08/15/2017 1133   CL 107 09/27/2012 0940   CO2 27 08/15/2017 1133   CO2 25 03/02/2016 1238   GLUCOSE 138 (H) 08/15/2017 1133   GLUCOSE 163 (H) 03/02/2016 1238   GLUCOSE 89 09/27/2012 0940   BUN 22 08/15/2017 1133   BUN 20.9 03/02/2016 1238   CREATININE 1.88 (H) 08/15/2017 1133    CREATININE 1.8 (H) 03/02/2016 1238   CALCIUM 8.8 08/15/2017 1133   CALCIUM 8.4 03/02/2016 1238   PROT 7.6 08/15/2017 1133   PROT 8.2 03/02/2016 1238   ALBUMIN 3.4 (L) 06/02/2017 1221   ALBUMIN 2.6 (L) 03/02/2016 1238   AST 15 08/15/2017 1133   AST 21 03/02/2016 1238   ALT 16 08/15/2017 1133   ALT 29 03/02/2016 1238   ALKPHOS 73 06/02/2017 1221   ALKPHOS 84 03/02/2016 1238   BILITOT 0.6 08/15/2017 1133   BILITOT <0.30 03/02/2016 1238   GFRNONAA 32 (L) 08/15/2017 1133   GFRAA 37 (L) 08/15/2017 1133    No results found for: SPEP, UPEP  Lab Results  Component Value Date   WBC 4.6 08/25/2017   NEUTROABS 1.3 (L) 08/25/2017   HGB 10.2 (L) 08/25/2017   HCT 33.9 (L) 08/25/2017   MCV 77.5 (L) 08/25/2017   PLT 234 08/25/2017      Chemistry      Component Value Date/Time   NA 137 08/15/2017 1133   NA 138 03/02/2016 1238   K 4.6 08/15/2017 1133   K 4.6 03/02/2016 1238   CL 101 08/15/2017 1133   CL 107 09/27/2012 0940   CO2 27 08/15/2017 1133   CO2 25 03/02/2016 1238   BUN 22 08/15/2017 1133   BUN 20.9 03/02/2016 1238   CREATININE 1.88 (H) 08/15/2017 1133   CREATININE 1.8 (H) 03/02/2016 1238      Component Value Date/Time   CALCIUM 8.8 08/15/2017 1133   CALCIUM 8.4 03/02/2016 1238   ALKPHOS 73 06/02/2017 1221   ALKPHOS 84 03/02/2016 1238   AST 15 08/15/2017 1133   AST 21 03/02/2016 1238   ALT 16 08/15/2017 1133   ALT 29 03/02/2016 1238   BILITOT 0.6 08/15/2017 1133   BILITOT <0.30 03/02/2016 1238     All questions were answered. The patient knows to call the clinic with any problems, questions or concerns. No barriers to learning was detected.  I spent 15 minutes counseling the patient face to face. The total time spent in the appointment was 20 minutes and more than 50% was on counseling and review of test results  Heath Lark, MD 08/25/2017 5:14 PM

## 2017-09-01 ENCOUNTER — Inpatient Hospital Stay: Payer: Medicare Other

## 2017-09-01 DIAGNOSIS — D46C Myelodysplastic syndrome with isolated del(5q) chromosomal abnormality: Secondary | ICD-10-CM

## 2017-09-01 DIAGNOSIS — D631 Anemia in chronic kidney disease: Secondary | ICD-10-CM

## 2017-09-01 DIAGNOSIS — D539 Nutritional anemia, unspecified: Secondary | ICD-10-CM

## 2017-09-01 DIAGNOSIS — D696 Thrombocytopenia, unspecified: Secondary | ICD-10-CM | POA: Diagnosis not present

## 2017-09-01 DIAGNOSIS — N183 Chronic kidney disease, stage 3 unspecified: Secondary | ICD-10-CM

## 2017-09-01 DIAGNOSIS — D509 Iron deficiency anemia, unspecified: Secondary | ICD-10-CM

## 2017-09-01 DIAGNOSIS — E538 Deficiency of other specified B group vitamins: Secondary | ICD-10-CM | POA: Diagnosis not present

## 2017-09-01 DIAGNOSIS — D693 Immune thrombocytopenic purpura: Secondary | ICD-10-CM

## 2017-09-01 LAB — CBC WITH DIFFERENTIAL/PLATELET
Basophils Absolute: 0 10*3/uL (ref 0.0–0.1)
Basophils Relative: 1 %
EOS ABS: 0 10*3/uL (ref 0.0–0.5)
Eosinophils Relative: 1 %
HEMATOCRIT: 35.9 % — AB (ref 38.4–49.9)
HEMOGLOBIN: 10.4 g/dL — AB (ref 13.0–17.1)
LYMPHS ABS: 2.1 10*3/uL (ref 0.9–3.3)
LYMPHS PCT: 47 %
MCH: 23.6 pg — AB (ref 27.2–33.4)
MCHC: 29 g/dL — AB (ref 32.0–36.0)
MCV: 81.6 fL (ref 79.3–98.0)
MONOS PCT: 18 %
Monocytes Absolute: 0.8 10*3/uL (ref 0.1–0.9)
Neutro Abs: 1.4 10*3/uL — ABNORMAL LOW (ref 1.5–6.5)
Neutrophils Relative %: 33 %
Platelets: 196 10*3/uL (ref 140–400)
RBC: 4.4 MIL/uL (ref 4.20–5.82)
RDW: 26.5 % — ABNORMAL HIGH (ref 11.0–14.6)
WBC: 4.4 10*3/uL (ref 4.0–10.3)

## 2017-09-01 MED ORDER — DARBEPOETIN ALFA 200 MCG/0.4ML IJ SOSY
PREFILLED_SYRINGE | INTRAMUSCULAR | Status: AC
  Filled 2017-09-01: qty 0.4

## 2017-09-01 MED ORDER — ROMIPLOSTIM 250 MCG ~~LOC~~ SOLR
250.0000 ug | SUBCUTANEOUS | Status: DC
  Administered 2017-09-01: 250 ug via SUBCUTANEOUS
  Filled 2017-09-01: qty 0.5

## 2017-09-01 NOTE — Patient Instructions (Signed)
Romiplostim injection What is this medicine? ROMIPLOSTIM (roe mi PLOE stim) helps your body make more platelets. This medicine is used to treat low platelets caused by chronic idiopathic thrombocytopenic purpura (ITP). This medicine may be used for other purposes; ask your health care provider or pharmacist if you have questions. COMMON BRAND NAME(S): Nplate What should I tell my health care provider before I take this medicine? They need to know if you have any of these conditions: -cancer or myelodysplastic syndrome -low blood counts, like low white cell, platelet, or red cell counts -take medicines that treat or prevent blood clots -an unusual or allergic reaction to romiplostim, mannitol, other medicines, foods, dyes, or preservatives -pregnant or trying to get pregnant -breast-feeding How should I use this medicine? This medicine is for injection under the skin. It is given by a health care professional in a hospital or clinic setting. A special MedGuide will be given to you before your injection. Read this information carefully each time. Talk to your pediatrician regarding the use of this medicine in children. Special care may be needed. Overdosage: If you think you have taken too much of this medicine contact a poison control center or emergency room at once. NOTE: This medicine is only for you. Do not share this medicine with others. What if I miss a dose? It is important not to miss your dose. Call your doctor or health care professional if you are unable to keep an appointment. What may interact with this medicine? Interactions are not expected. This list may not describe all possible interactions. Give your health care provider a list of all the medicines, herbs, non-prescription drugs, or dietary supplements you use. Also tell them if you smoke, drink alcohol, or use illegal drugs. Some items may interact with your medicine. What should I watch for while using this  medicine? Your condition will be monitored carefully while you are receiving this medicine. Visit your prescriber or health care professional for regular checks on your progress and for the needed blood tests. It is important to keep all appointments. What side effects may I notice from receiving this medicine? Side effects that you should report to your doctor or health care professional as soon as possible: -allergic reactions like skin rash, itching or hives, swelling of the face, lips, or tongue -shortness of breath, chest pain, swelling in a leg -unusual bleeding or bruising Side effects that usually do not require medical attention (report to your doctor or health care professional if they continue or are bothersome): -dizziness -headache -muscle aches -pain in arms and legs -stomach pain -trouble sleeping This list may not describe all possible side effects. Call your doctor for medical advice about side effects. You may report side effects to FDA at 1-800-FDA-1088. Where should I keep my medicine? This drug is given in a hospital or clinic and will not be stored at home. NOTE: This sheet is a summary. It may not cover all possible information. If you have questions about this medicine, talk to your doctor, pharmacist, or health care provider.  2018 Elsevier/Gold Standard (2008-02-05 15:13:04)  

## 2017-09-01 NOTE — Progress Notes (Signed)
Pt Hgb is 10.4 today aranesp injection is not needed. Pt is still getting n plate inj. Explained labs to family.

## 2017-09-02 LAB — IRON AND TIBC
IRON: 49 ug/dL (ref 42–163)
SATURATION RATIOS: 21 % — AB (ref 42–163)
TIBC: 234 ug/dL (ref 202–409)
UIBC: 185 ug/dL

## 2017-09-02 LAB — FERRITIN: Ferritin: 183 ng/mL (ref 22–316)

## 2017-09-05 ENCOUNTER — Other Ambulatory Visit: Payer: Self-pay | Admitting: Family Medicine

## 2017-09-05 DIAGNOSIS — R911 Solitary pulmonary nodule: Secondary | ICD-10-CM

## 2017-09-05 NOTE — Progress Notes (Signed)
[facility] and they were unable to see the original order. New order was put in so that they could see it.

## 2017-09-08 ENCOUNTER — Inpatient Hospital Stay: Payer: Medicare Other

## 2017-09-08 DIAGNOSIS — D46C Myelodysplastic syndrome with isolated del(5q) chromosomal abnormality: Secondary | ICD-10-CM

## 2017-09-08 DIAGNOSIS — D696 Thrombocytopenia, unspecified: Secondary | ICD-10-CM

## 2017-09-08 DIAGNOSIS — D631 Anemia in chronic kidney disease: Secondary | ICD-10-CM

## 2017-09-08 DIAGNOSIS — N183 Chronic kidney disease, stage 3 unspecified: Secondary | ICD-10-CM

## 2017-09-08 DIAGNOSIS — D539 Nutritional anemia, unspecified: Secondary | ICD-10-CM

## 2017-09-08 DIAGNOSIS — E538 Deficiency of other specified B group vitamins: Secondary | ICD-10-CM | POA: Diagnosis not present

## 2017-09-08 DIAGNOSIS — D693 Immune thrombocytopenic purpura: Secondary | ICD-10-CM

## 2017-09-08 LAB — CBC WITH DIFFERENTIAL/PLATELET
BASOS ABS: 0 10*3/uL (ref 0.0–0.1)
Basophils Relative: 0 %
EOS ABS: 0 10*3/uL (ref 0.0–0.5)
EOS PCT: 0 %
HCT: 36.1 % — ABNORMAL LOW (ref 38.4–49.9)
Hemoglobin: 10.6 g/dL — ABNORMAL LOW (ref 13.0–17.1)
LYMPHS PCT: 63 %
Lymphs Abs: 1.9 10*3/uL (ref 0.9–3.3)
MCH: 24 pg — AB (ref 27.2–33.4)
MCHC: 29.4 g/dL — ABNORMAL LOW (ref 32.0–36.0)
MCV: 81.9 fL (ref 79.3–98.0)
MONO ABS: 0.4 10*3/uL (ref 0.1–0.9)
Monocytes Relative: 12 %
Neutro Abs: 0.7 10*3/uL — ABNORMAL LOW (ref 1.5–6.5)
Neutrophils Relative %: 25 %
Platelets: 130 10*3/uL — ABNORMAL LOW (ref 140–400)
RBC: 4.41 MIL/uL (ref 4.20–5.82)
RDW: 25.9 % — AB (ref 11.0–14.6)
WBC: 3 10*3/uL — AB (ref 4.0–10.3)

## 2017-09-08 MED ORDER — ROMIPLOSTIM 250 MCG ~~LOC~~ SOLR
250.0000 ug | SUBCUTANEOUS | Status: DC
  Administered 2017-09-08: 250 ug via SUBCUTANEOUS
  Filled 2017-09-08: qty 0.5

## 2017-09-08 NOTE — Patient Instructions (Signed)
Romiplostim injection What is this medicine? ROMIPLOSTIM (roe mi PLOE stim) helps your body make more platelets. This medicine is used to treat low platelets caused by chronic idiopathic thrombocytopenic purpura (ITP). This medicine may be used for other purposes; ask your health care provider or pharmacist if you have questions. COMMON BRAND NAME(S): Nplate What should I tell my health care provider before I take this medicine? They need to know if you have any of these conditions: -cancer or myelodysplastic syndrome -low blood counts, like low white cell, platelet, or red cell counts -take medicines that treat or prevent blood clots -an unusual or allergic reaction to romiplostim, mannitol, other medicines, foods, dyes, or preservatives -pregnant or trying to get pregnant -breast-feeding How should I use this medicine? This medicine is for injection under the skin. It is given by a health care professional in a hospital or clinic setting. A special MedGuide will be given to you before your injection. Read this information carefully each time. Talk to your pediatrician regarding the use of this medicine in children. Special care may be needed. Overdosage: If you think you have taken too much of this medicine contact a poison control center or emergency room at once. NOTE: This medicine is only for you. Do not share this medicine with others. What if I miss a dose? It is important not to miss your dose. Call your doctor or health care professional if you are unable to keep an appointment. What may interact with this medicine? Interactions are not expected. This list may not describe all possible interactions. Give your health care provider a list of all the medicines, herbs, non-prescription drugs, or dietary supplements you use. Also tell them if you smoke, drink alcohol, or use illegal drugs. Some items may interact with your medicine. What should I watch for while using this  medicine? Your condition will be monitored carefully while you are receiving this medicine. Visit your prescriber or health care professional for regular checks on your progress and for the needed blood tests. It is important to keep all appointments. What side effects may I notice from receiving this medicine? Side effects that you should report to your doctor or health care professional as soon as possible: -allergic reactions like skin rash, itching or hives, swelling of the face, lips, or tongue -shortness of breath, chest pain, swelling in a leg -unusual bleeding or bruising Side effects that usually do not require medical attention (report to your doctor or health care professional if they continue or are bothersome): -dizziness -headache -muscle aches -pain in arms and legs -stomach pain -trouble sleeping This list may not describe all possible side effects. Call your doctor for medical advice about side effects. You may report side effects to FDA at 1-800-FDA-1088. Where should I keep my medicine? This drug is given in a hospital or clinic and will not be stored at home. NOTE: This sheet is a summary. It may not cover all possible information. If you have questions about this medicine, talk to your doctor, pharmacist, or health care provider.  2018 Elsevier/Gold Standard (2008-02-05 15:13:04)  

## 2017-09-09 ENCOUNTER — Ambulatory Visit (INDEPENDENT_AMBULATORY_CARE_PROVIDER_SITE_OTHER): Payer: Medicare Other

## 2017-09-09 DIAGNOSIS — E538 Deficiency of other specified B group vitamins: Secondary | ICD-10-CM | POA: Diagnosis not present

## 2017-09-09 MED ORDER — CYANOCOBALAMIN 1000 MCG/ML IJ SOLN
1000.0000 ug | Freq: Once | INTRAMUSCULAR | Status: AC
  Administered 2017-09-09: 1000 ug via INTRAMUSCULAR

## 2017-09-14 ENCOUNTER — Ambulatory Visit
Admission: RE | Admit: 2017-09-14 | Discharge: 2017-09-14 | Disposition: A | Discharge status: Home / Self Care | Payer: Medicare Other | Source: Ambulatory Visit | Attending: Family Medicine | Admitting: Family Medicine

## 2017-09-14 DIAGNOSIS — R911 Solitary pulmonary nodule: Secondary | ICD-10-CM | POA: Diagnosis not present

## 2017-09-15 ENCOUNTER — Inpatient Hospital Stay: Payer: Medicare Other

## 2017-09-15 DIAGNOSIS — D631 Anemia in chronic kidney disease: Secondary | ICD-10-CM

## 2017-09-15 DIAGNOSIS — D696 Thrombocytopenia, unspecified: Secondary | ICD-10-CM | POA: Diagnosis not present

## 2017-09-15 DIAGNOSIS — E538 Deficiency of other specified B group vitamins: Secondary | ICD-10-CM | POA: Diagnosis not present

## 2017-09-15 DIAGNOSIS — D693 Immune thrombocytopenic purpura: Secondary | ICD-10-CM

## 2017-09-15 DIAGNOSIS — D46C Myelodysplastic syndrome with isolated del(5q) chromosomal abnormality: Secondary | ICD-10-CM

## 2017-09-15 DIAGNOSIS — D539 Nutritional anemia, unspecified: Secondary | ICD-10-CM

## 2017-09-15 DIAGNOSIS — N183 Chronic kidney disease, stage 3 unspecified: Secondary | ICD-10-CM

## 2017-09-15 MED ORDER — DARBEPOETIN ALFA 200 MCG/0.4ML IJ SOSY
PREFILLED_SYRINGE | INTRAMUSCULAR | Status: AC
  Filled 2017-09-15: qty 0.4

## 2017-09-15 MED ORDER — ROMIPLOSTIM INJECTION 500 MCG
340.0000 ug | SUBCUTANEOUS | Status: DC
  Administered 2017-09-15: 340 ug via SUBCUTANEOUS
  Filled 2017-09-15: qty 0.68

## 2017-09-15 NOTE — Progress Notes (Signed)
Pt Hgb is 10.3 today aranesp injection is not needed. pts platelets are 39 today and abs Neutro is 0.4 today explained neutropenia and gave information to him and family on precautions. Dr [name] is aware of levels and pharmacy is adjusting his N plate today. Giving copy of labs to family.

## 2017-09-15 NOTE — Patient Instructions (Signed)
Romiplostim injection What is this medicine? ROMIPLOSTIM (roe mi PLOE stim) helps your body make more platelets. This medicine is used to treat low platelets caused by chronic idiopathic thrombocytopenic purpura (ITP). This medicine may be used for other purposes; ask your health care provider or pharmacist if you have questions. COMMON BRAND NAME(S): Nplate What should I tell my health care provider before I take this medicine? They need to know if you have any of these conditions: -cancer or myelodysplastic syndrome -low blood counts, like low white cell, platelet, or red cell counts -take medicines that treat or prevent blood clots -an unusual or allergic reaction to romiplostim, mannitol, other medicines, foods, dyes, or preservatives -pregnant or trying to get pregnant -breast-feeding How should I use this medicine? This medicine is for injection under the skin. It is given by a health care professional in a hospital or clinic setting. A special MedGuide will be given to you before your injection. Read this information carefully each time. Talk to your pediatrician regarding the use of this medicine in children. Special care may be needed. Overdosage: If you think you have taken too much of this medicine contact a poison control center or emergency room at once. NOTE: This medicine is only for you. Do not share this medicine with others. What if I miss a dose? It is important not to miss your dose. Call your doctor or health care professional if you are unable to keep an appointment. What may interact with this medicine? Interactions are not expected. This list may not describe all possible interactions. Give your health care provider a list of all the medicines, herbs, non-prescription drugs, or dietary supplements you use. Also tell them if you smoke, drink alcohol, or use illegal drugs. Some items may interact with your medicine. What should I watch for while using this  medicine? Your condition will be monitored carefully while you are receiving this medicine. Visit your prescriber or health care professional for regular checks on your progress and for the needed blood tests. It is important to keep all appointments. What side effects may I notice from receiving this medicine? Side effects that you should report to your doctor or health care professional as soon as possible: -allergic reactions like skin rash, itching or hives, swelling of the face, lips, or tongue -shortness of breath, chest pain, swelling in a leg -unusual bleeding or bruising Side effects that usually do not require medical attention (report to your doctor or health care professional if they continue or are bothersome): -dizziness -headache -muscle aches -pain in arms and legs -stomach pain -trouble sleeping This list may not describe all possible side effects. Call your doctor for medical advice about side effects. You may report side effects to FDA at 1-800-FDA-1088. Where should I keep my medicine? This drug is given in a hospital or clinic and will not be stored at home. NOTE: This sheet is a summary. It may not cover all possible information. If you have questions about this medicine, talk to your doctor, pharmacist, or health care provider.  2018 Elsevier/Gold Standard (2008-02-05 15:13:04)  Neutropenia Neutropenia is a condition that occurs when you have a lower-than-normal level of a type of white blood cell (neutrophil) in your body. Neutrophils are made in the spongy center of large bones (bone marrow) and they fight infections. Neutrophils are your body's main defense against bacterial and fungal infections. The fewer neutrophils you have and the longer your body remains without them, the greater your risk of  getting a severe infection. What are the causes? This condition can occur if your body uses up or destroys neutrophils faster than your bone marrow can make them. This  problem may happen because of:  Bacterial or fungal infection.  Allergic disorders.  Reactions to some medicines.  Autoimmune disease.  An enlarged spleen.  This condition can also occur if your bone marrow does not produce enough neutrophils. This problem may be caused by:  Cancer.  Cancer treatments, such as radiation or chemotherapy.  Viral infections.  Medicines, such as phenytoin.  Vitamin B12 deficiency.  Diseases of the bone marrow.  Environmental toxins, such as insecticides.  What are the signs or symptoms? This condition does not usually cause symptoms. If symptoms are present, they are usually caused by an underlying infection. Symptoms of an infection may include:  Fever.  Chills.  Swollen glands.  Oral or anal ulcers.  Cough and shortness of breath.  Rash.  Skin infection.  Fatigue.  How is this diagnosed? Your health care provider may suspect neutropenia if you have:  A condition that may cause neutropenia.  Symptoms of infection, especially fever.  Frequent and unusual infections.  You will have a medical history and physical exam. Tests will also be done, such as:  A complete blood count (CBC).  A procedure to collect a sample of bone marrow for examination (bone marrow biopsy).  A chest X-ray.  A urine culture.  A blood culture.  How is this treated? Treatment depends on the underlying cause and severity of your condition. Mild neutropenia may not require treatment. Treatment may include medicines, such as:  Antibiotic medicine given through an IV tube.  Antiviral medicines.  Antifungal medicines.  A medicine to increase neutrophil production (colony-stimulating factor). You may get this drug through an IV tube or by injection.  Steroids given through an IV tube.  If an underlying condition is causing neutropenia, you may need treatment for that condition. If medicines you are taking are causing neutropenia, your health  care provider may have you stop taking those medicines. Follow these instructions at home: Medicines  Take over-the-counter and prescription medicines only as told by your health care provider.  Get a seasonal flu shot (influenza vaccine). Lifestyle  Do not eat unpasteurized foods.Do not eat unwashed raw fruits or vegetables.  Avoid exposure to groups of people or children.  Avoid being around people who are sick.  Avoid being around dirt or dust, such as in construction areas or gardens.  Do not provide direct care for pets. Avoid animal droppings. Do not clean litter boxes and bird cages. Hygiene   Bathe daily.  Clean the area between the genitals and the anus (perineal area) after you urinate or have a bowel movement. If you are male, wipe from front to back.  Brush your teeth with a soft toothbrush before and after meals.  Do not use a razor that has a blade. Use an electric razor to remove hair.  Wash your hands often. Make sure others who come in contact with you also wash their hands. If soap and water are not available, use hand sanitizer. General instructions  Do not have sex unless your health care provider has approved.  Take actions to avoid cuts and burns. For example: ? Be cautious when you use knives. Always cut away from yourself. ? Keep knives in protective sheaths or guards when not in use. ? Use oven mitts when you cook with a hot stove, oven, or grill. ?  Stand a safe distance away from open fires.  Avoid people who received a vaccine in the past 30 days if that vaccine contained a live version of the germ (live vaccine). You should not get a live vaccine. Common live vaccines are varicella, measles, mumps, and rubella.  Do not share food utensils.  Do not use tampons, enemas, or rectal suppositories unless your health care provider has approved.  Keep all appointments as told by your health care provider. This is important. Contact a health care  provider if:  You have a fever.  You have chills or you start to shake.  You have: ? A sore throat. ? A warm, red, or tender area on your skin. ? A cough. ? Frequent or painful urination. ? Vaginal discharge or itching.  You develop: ? Sores in your mouth or anus. ? Swollen lymph nodes. ? Red streaks on the skin. ? A rash.  You feel: ? Nauseous or you vomit. ? Very fatigued. ? Short of breath. This information is not intended to replace advice given to you by your health care provider. Make sure you discuss any questions you have with your health care provider. Document Released: 11/27/2001 Document Revised: 11/13/2015 Document Reviewed: 12/18/2014 Elsevier Interactive Patient Education  [employer].

## 2017-09-16 LAB — CBC WITH DIFFERENTIAL/PLATELET
BASOS PCT: 7 %
Basophils Absolute: 0.2 10*3/uL — ABNORMAL HIGH (ref 0.0–0.1)
EOS PCT: 1 %
Eosinophils Absolute: 0 10*3/uL (ref 0.0–0.5)
HCT: 33.9 % — ABNORMAL LOW (ref 38.4–49.9)
HEMOGLOBIN: 10.3 g/dL — AB (ref 13.0–17.1)
LYMPHS ABS: 1.6 10*3/uL (ref 0.9–3.3)
Lymphocytes Relative: 66 %
MCH: 23.8 pg — AB (ref 27.2–33.4)
MCHC: 30.4 g/dL — AB (ref 32.0–36.0)
MCV: 78.5 fL — ABNORMAL LOW (ref 79.3–98.0)
MONO ABS: 0.2 10*3/uL (ref 0.1–0.9)
MONOS PCT: 9 %
Neutro Abs: 0.4 10*3/uL — CL (ref 1.5–6.5)
Neutrophils Relative %: 17 %
PLATELETS: 39 10*3/uL — AB (ref 140–400)
RBC: 4.32 MIL/uL (ref 4.20–5.82)
RDW: 27.2 % — AB (ref 11.0–14.6)
WBC: 2.4 10*3/uL — ABNORMAL LOW (ref 4.0–10.3)

## 2017-09-19 ENCOUNTER — Other Ambulatory Visit: Payer: Self-pay | Admitting: Family Medicine

## 2017-09-19 MED ORDER — AZITHROMYCIN 250 MG PO TABS: ORAL_TABLET | ORAL | 0 refills | Status: DC

## 2017-09-22 ENCOUNTER — Inpatient Hospital Stay: Payer: Medicare Other

## 2017-09-22 ENCOUNTER — Inpatient Hospital Stay: Payer: Medicare Other | Attending: Hematology and Oncology

## 2017-09-22 DIAGNOSIS — D46C Myelodysplastic syndrome with isolated del(5q) chromosomal abnormality: Secondary | ICD-10-CM

## 2017-09-22 DIAGNOSIS — D693 Immune thrombocytopenic purpura: Secondary | ICD-10-CM

## 2017-09-22 DIAGNOSIS — D631 Anemia in chronic kidney disease: Secondary | ICD-10-CM | POA: Insufficient documentation

## 2017-09-22 DIAGNOSIS — N183 Chronic kidney disease, stage 3 unspecified: Secondary | ICD-10-CM

## 2017-09-22 DIAGNOSIS — D539 Nutritional anemia, unspecified: Secondary | ICD-10-CM

## 2017-09-22 DIAGNOSIS — D696 Thrombocytopenia, unspecified: Secondary | ICD-10-CM

## 2017-09-22 LAB — CBC WITH DIFFERENTIAL/PLATELET
BASOS PCT: 0 %
Basophils Absolute: 0 10*3/uL (ref 0.0–0.1)
EOS PCT: 1 %
Eosinophils Absolute: 0 10*3/uL (ref 0.0–0.5)
HEMATOCRIT: 35.7 % — AB (ref 38.4–49.9)
Hemoglobin: 10.6 g/dL — ABNORMAL LOW (ref 13.0–17.1)
LYMPHS PCT: 54 %
Lymphs Abs: 1.6 10*3/uL (ref 0.9–3.3)
MCH: 24.2 pg — ABNORMAL LOW (ref 27.2–33.4)
MCHC: 29.7 g/dL — AB (ref 32.0–36.0)
MCV: 81.5 fL (ref 79.3–98.0)
MONO ABS: 0.3 10*3/uL (ref 0.1–0.9)
MONOS PCT: 12 %
Neutro Abs: 0.9 10*3/uL — ABNORMAL LOW (ref 1.5–6.5)
Neutrophils Relative %: 33 %
PLATELETS: 30 10*3/uL — AB (ref 140–400)
RBC: 4.38 MIL/uL (ref 4.20–5.82)
RDW: 25.6 % — AB (ref 11.0–14.6)
WBC: 2.9 10*3/uL — AB (ref 4.0–10.3)

## 2017-09-22 MED ORDER — ROMIPLOSTIM INJECTION 500 MCG
425.0000 ug | SUBCUTANEOUS | Status: DC
  Administered 2017-09-22: 425 ug via SUBCUTANEOUS
  Filled 2017-09-22: qty 0.85

## 2017-09-22 NOTE — Patient Instructions (Signed)
Romiplostim injection What is this medicine? ROMIPLOSTIM (roe mi PLOE stim) helps your body make more platelets. This medicine is used to treat low platelets caused by chronic idiopathic thrombocytopenic purpura (ITP). This medicine may be used for other purposes; ask your health care provider or pharmacist if you have questions. COMMON BRAND NAME(S): Nplate What should I tell my health care provider before I take this medicine? They need to know if you have any of these conditions: -cancer or myelodysplastic syndrome -low blood counts, like low white cell, platelet, or red cell counts -take medicines that treat or prevent blood clots -an unusual or allergic reaction to romiplostim, mannitol, other medicines, foods, dyes, or preservatives -pregnant or trying to get pregnant -breast-feeding How should I use this medicine? This medicine is for injection under the skin. It is given by a health care professional in a hospital or clinic setting. A special MedGuide will be given to you before your injection. Read this information carefully each time. Talk to your pediatrician regarding the use of this medicine in children. Special care may be needed. Overdosage: If you think you have taken too much of this medicine contact a poison control center or emergency room at once. NOTE: This medicine is only for you. Do not share this medicine with others. What if I miss a dose? It is important not to miss your dose. Call your doctor or health care professional if you are unable to keep an appointment. What may interact with this medicine? Interactions are not expected. This list may not describe all possible interactions. Give your health care provider a list of all the medicines, herbs, non-prescription drugs, or dietary supplements you use. Also tell them if you smoke, drink alcohol, or use illegal drugs. Some items may interact with your medicine. What should I watch for while using this  medicine? Your condition will be monitored carefully while you are receiving this medicine. Visit your prescriber or health care professional for regular checks on your progress and for the needed blood tests. It is important to keep all appointments. What side effects may I notice from receiving this medicine? Side effects that you should report to your doctor or health care professional as soon as possible: -allergic reactions like skin rash, itching or hives, swelling of the face, lips, or tongue -shortness of breath, chest pain, swelling in a leg -unusual bleeding or bruising Side effects that usually do not require medical attention (report to your doctor or health care professional if they continue or are bothersome): -dizziness -headache -muscle aches -pain in arms and legs -stomach pain -trouble sleeping This list may not describe all possible side effects. Call your doctor for medical advice about side effects. You may report side effects to FDA at 1-800-FDA-1088. Where should I keep my medicine? This drug is given in a hospital or clinic and will not be stored at home. NOTE: This sheet is a summary. It may not cover all possible information. If you have questions about this medicine, talk to your doctor, pharmacist, or health care provider.  2018 Elsevier/Gold Standard (2008-02-05 15:13:04)  Neutropenia Neutropenia is a condition that occurs when you have a lower-than-normal level of a type of white blood cell (neutrophil) in your body. Neutrophils are made in the spongy center of large bones (bone marrow) and they fight infections. Neutrophils are your body's main defense against bacterial and fungal infections. The fewer neutrophils you have and the longer your body remains without them, the greater your risk of  getting a severe infection. What are the causes? This condition can occur if your body uses up or destroys neutrophils faster than your bone marrow can make them. This  problem may happen because of:  Bacterial or fungal infection.  Allergic disorders.  Reactions to some medicines.  Autoimmune disease.  An enlarged spleen.  This condition can also occur if your bone marrow does not produce enough neutrophils. This problem may be caused by:  Cancer.  Cancer treatments, such as radiation or chemotherapy.  Viral infections.  Medicines, such as phenytoin.  Vitamin B12 deficiency.  Diseases of the bone marrow.  Environmental toxins, such as insecticides.  What are the signs or symptoms? This condition does not usually cause symptoms. If symptoms are present, they are usually caused by an underlying infection. Symptoms of an infection may include:  Fever.  Chills.  Swollen glands.  Oral or anal ulcers.  Cough and shortness of breath.  Rash.  Skin infection.  Fatigue.  How is this diagnosed? Your health care provider may suspect neutropenia if you have:  A condition that may cause neutropenia.  Symptoms of infection, especially fever.  Frequent and unusual infections.  You will have a medical history and physical exam. Tests will also be done, such as:  A complete blood count (CBC).  A procedure to collect a sample of bone marrow for examination (bone marrow biopsy).  A chest X-ray.  A urine culture.  A blood culture.  How is this treated? Treatment depends on the underlying cause and severity of your condition. Mild neutropenia may not require treatment. Treatment may include medicines, such as:  Antibiotic medicine given through an IV tube.  Antiviral medicines.  Antifungal medicines.  A medicine to increase neutrophil production (colony-stimulating factor). You may get this drug through an IV tube or by injection.  Steroids given through an IV tube.  If an underlying condition is causing neutropenia, you may need treatment for that condition. If medicines you are taking are causing neutropenia, your health  care provider may have you stop taking those medicines. Follow these instructions at home: Medicines  Take over-the-counter and prescription medicines only as told by your health care provider.  Get a seasonal flu shot (influenza vaccine). Lifestyle  Do not eat unpasteurized foods.Do not eat unwashed raw fruits or vegetables.  Avoid exposure to groups of people or children.  Avoid being around people who are sick.  Avoid being around dirt or dust, such as in construction areas or gardens.  Do not provide direct care for pets. Avoid animal droppings. Do not clean litter boxes and bird cages. Hygiene   Bathe daily.  Clean the area between the genitals and the anus (perineal area) after you urinate or have a bowel movement. If you are male, wipe from front to back.  Brush your teeth with a soft toothbrush before and after meals.  Do not use a razor that has a blade. Use an electric razor to remove hair.  Wash your hands often. Make sure others who come in contact with you also wash their hands. If soap and water are not available, use hand sanitizer. General instructions  Do not have sex unless your health care provider has approved.  Take actions to avoid cuts and burns. For example: ? Be cautious when you use knives. Always cut away from yourself. ? Keep knives in protective sheaths or guards when not in use. ? Use oven mitts when you cook with a hot stove, oven, or grill. ?  Stand a safe distance away from open fires.  Avoid people who received a vaccine in the past 30 days if that vaccine contained a live version of the germ (live vaccine). You should not get a live vaccine. Common live vaccines are varicella, measles, mumps, and rubella.  Do not share food utensils.  Do not use tampons, enemas, or rectal suppositories unless your health care provider has approved.  Keep all appointments as told by your health care provider. This is important. Contact a health care  provider if:  You have a fever.  You have chills or you start to shake.  You have: ? A sore throat. ? A warm, red, or tender area on your skin. ? A cough. ? Frequent or painful urination. ? Vaginal discharge or itching.  You develop: ? Sores in your mouth or anus. ? Swollen lymph nodes. ? Red streaks on the skin. ? A rash.  You feel: ? Nauseous or you vomit. ? Very fatigued. ? Short of breath. This information is not intended to replace advice given to you by your health care provider. Make sure you discuss any questions you have with your health care provider. Document Released: 11/27/2001 Document Revised: 11/13/2015 Document Reviewed: 12/18/2014 Elsevier Interactive Patient Education  [employer].

## 2017-09-29 ENCOUNTER — Inpatient Hospital Stay: Payer: Medicare Other

## 2017-09-29 DIAGNOSIS — N183 Chronic kidney disease, stage 3 unspecified: Secondary | ICD-10-CM

## 2017-09-29 DIAGNOSIS — D693 Immune thrombocytopenic purpura: Secondary | ICD-10-CM

## 2017-09-29 DIAGNOSIS — D696 Thrombocytopenia, unspecified: Secondary | ICD-10-CM

## 2017-09-29 DIAGNOSIS — D631 Anemia in chronic kidney disease: Secondary | ICD-10-CM | POA: Diagnosis not present

## 2017-09-29 DIAGNOSIS — D539 Nutritional anemia, unspecified: Secondary | ICD-10-CM

## 2017-09-29 DIAGNOSIS — D46C Myelodysplastic syndrome with isolated del(5q) chromosomal abnormality: Secondary | ICD-10-CM

## 2017-09-29 LAB — CBC WITH DIFFERENTIAL/PLATELET
BASOS ABS: 0 10*3/uL (ref 0.0–0.1)
BASOS PCT: 0 %
EOS PCT: 1 %
Eosinophils Absolute: 0 10*3/uL (ref 0.0–0.5)
HCT: 37.5 % — ABNORMAL LOW (ref 38.4–49.9)
Hemoglobin: 11 g/dL — ABNORMAL LOW (ref 13.0–17.1)
LYMPHS ABS: 1.5 10*3/uL (ref 0.9–3.3)
Lymphocytes Relative: 48 %
MCH: 24 pg — AB (ref 27.2–33.4)
MCHC: 29.3 g/dL — AB (ref 32.0–36.0)
MCV: 81.9 fL (ref 79.3–98.0)
Monocytes Absolute: 0.4 10*3/uL (ref 0.1–0.9)
Monocytes Relative: 13 %
Neutro Abs: 1.1 10*3/uL — ABNORMAL LOW (ref 1.5–6.5)
Neutrophils Relative %: 38 %
PLATELETS: 102 10*3/uL — AB (ref 140–400)
RBC: 4.58 MIL/uL (ref 4.20–5.82)
RDW: 25.7 % — ABNORMAL HIGH (ref 11.0–14.6)
WBC: 3 10*3/uL — ABNORMAL LOW (ref 4.0–10.3)

## 2017-09-29 MED ORDER — ROMIPLOSTIM INJECTION 500 MCG
425.0000 ug | SUBCUTANEOUS | Status: DC
Start: 2017-09-29 — End: 2017-09-29
  Administered 2017-09-29: 425 ug via SUBCUTANEOUS
  Filled 2017-09-29: qty 0.85

## 2017-09-29 MED ORDER — DARBEPOETIN ALFA 200 MCG/0.4ML IJ SOSY
PREFILLED_SYRINGE | INTRAMUSCULAR | Status: AC
  Filled 2017-09-29: qty 0.4

## 2017-09-29 NOTE — Patient Instructions (Signed)
Romiplostim injection What is this medicine? ROMIPLOSTIM (roe mi PLOE stim) helps your body make more platelets. This medicine is used to treat low platelets caused by chronic idiopathic thrombocytopenic purpura (ITP). This medicine may be used for other purposes; ask your health care provider or pharmacist if you have questions. COMMON BRAND NAME(S): Nplate What should I tell my health care provider before I take this medicine? They need to know if you have any of these conditions: -cancer or myelodysplastic syndrome -low blood counts, like low white cell, platelet, or red cell counts -take medicines that treat or prevent blood clots -an unusual or allergic reaction to romiplostim, mannitol, other medicines, foods, dyes, or preservatives -pregnant or trying to get pregnant -breast-feeding How should I use this medicine? This medicine is for injection under the skin. It is given by a health care professional in a hospital or clinic setting. A special MedGuide will be given to you before your injection. Read this information carefully each time. Talk to your pediatrician regarding the use of this medicine in children. Special care may be needed. Overdosage: If you think you have taken too much of this medicine contact a poison control center or emergency room at once. NOTE: This medicine is only for you. Do not share this medicine with others. What if I miss a dose? It is important not to miss your dose. Call your doctor or health care professional if you are unable to keep an appointment. What may interact with this medicine? Interactions are not expected. This list may not describe all possible interactions. Give your health care provider a list of all the medicines, herbs, non-prescription drugs, or dietary supplements you use. Also tell them if you smoke, drink alcohol, or use illegal drugs. Some items may interact with your medicine. What should I watch for while using this  medicine? Your condition will be monitored carefully while you are receiving this medicine. Visit your prescriber or health care professional for regular checks on your progress and for the needed blood tests. It is important to keep all appointments. What side effects may I notice from receiving this medicine? Side effects that you should report to your doctor or health care professional as soon as possible: -allergic reactions like skin rash, itching or hives, swelling of the face, lips, or tongue -shortness of breath, chest pain, swelling in a leg -unusual bleeding or bruising Side effects that usually do not require medical attention (report to your doctor or health care professional if they continue or are bothersome): -dizziness -headache -muscle aches -pain in arms and legs -stomach pain -trouble sleeping This list may not describe all possible side effects. Call your doctor for medical advice about side effects. You may report side effects to FDA at 1-800-FDA-1088. Where should I keep my medicine? This drug is given in a hospital or clinic and will not be stored at home. NOTE: This sheet is a summary. It may not cover all possible information. If you have questions about this medicine, talk to your doctor, pharmacist, or health care provider.  2018 Elsevier/Gold Standard (2008-02-05 15:13:04)  

## 2017-09-29 NOTE — Progress Notes (Signed)
Hgb is 11.0 today no aranesp injection needed at this time

## 2017-10-06 ENCOUNTER — Inpatient Hospital Stay: Payer: Medicare Other

## 2017-10-06 VITALS — BP 156/76 | HR 69 | Temp 98.0°F | Resp 20

## 2017-10-06 DIAGNOSIS — N183 Chronic kidney disease, stage 3 unspecified: Secondary | ICD-10-CM

## 2017-10-06 DIAGNOSIS — D631 Anemia in chronic kidney disease: Secondary | ICD-10-CM

## 2017-10-06 DIAGNOSIS — D46C Myelodysplastic syndrome with isolated del(5q) chromosomal abnormality: Secondary | ICD-10-CM

## 2017-10-06 DIAGNOSIS — D693 Immune thrombocytopenic purpura: Secondary | ICD-10-CM | POA: Diagnosis not present

## 2017-10-06 DIAGNOSIS — D696 Thrombocytopenia, unspecified: Secondary | ICD-10-CM

## 2017-10-06 DIAGNOSIS — D539 Nutritional anemia, unspecified: Secondary | ICD-10-CM

## 2017-10-06 LAB — CBC WITH DIFFERENTIAL/PLATELET
Basophils Absolute: 0 10*3/uL (ref 0.0–0.1)
Basophils Relative: 1 %
Eosinophils Absolute: 0 10*3/uL (ref 0.0–0.5)
Eosinophils Relative: 1 %
HCT: 37.8 % — ABNORMAL LOW (ref 38.4–49.9)
Hemoglobin: 11.2 g/dL — ABNORMAL LOW (ref 13.0–17.1)
Lymphocytes Relative: 48 %
Lymphs Abs: 1.9 10*3/uL (ref 0.9–3.3)
MCH: 23.9 pg — ABNORMAL LOW (ref 27.2–33.4)
MCHC: 29.6 g/dL — ABNORMAL LOW (ref 32.0–36.0)
MCV: 80.6 fL (ref 79.3–98.0)
Monocytes Absolute: 0.5 10*3/uL (ref 0.1–0.9)
Monocytes Relative: 13 %
Neutro Abs: 1.4 10*3/uL — ABNORMAL LOW (ref 1.5–6.5)
Neutrophils Relative %: 37 %
Platelets: 120 10*3/uL — ABNORMAL LOW (ref 140–400)
RBC: 4.69 MIL/uL (ref 4.20–5.82)
RDW: 26 % — ABNORMAL HIGH (ref 11.0–14.6)
WBC: 3.9 10*3/uL — ABNORMAL LOW (ref 4.0–10.3)

## 2017-10-06 MED ORDER — ROMIPLOSTIM INJECTION 500 MCG
425.0000 ug | SUBCUTANEOUS | Status: DC
Start: 2017-10-06 — End: 2017-10-06
  Administered 2017-10-06: 425 ug via SUBCUTANEOUS
  Filled 2017-10-06: qty 0.85

## 2017-10-06 NOTE — Patient Instructions (Signed)
Romiplostim injection What is this medicine? ROMIPLOSTIM (roe mi PLOE stim) helps your body make more platelets. This medicine is used to treat low platelets caused by chronic idiopathic thrombocytopenic purpura (ITP). This medicine may be used for other purposes; ask your health care provider or pharmacist if you have questions. COMMON BRAND NAME(S): Nplate What should I tell my health care provider before I take this medicine? They need to know if you have any of these conditions: -cancer or myelodysplastic syndrome -low blood counts, like low white cell, platelet, or red cell counts -take medicines that treat or prevent blood clots -an unusual or allergic reaction to romiplostim, mannitol, other medicines, foods, dyes, or preservatives -pregnant or trying to get pregnant -breast-feeding How should I use this medicine? This medicine is for injection under the skin. It is given by a health care professional in a hospital or clinic setting. A special MedGuide will be given to you before your injection. Read this information carefully each time. Talk to your pediatrician regarding the use of this medicine in children. Special care may be needed. Overdosage: If you think you have taken too much of this medicine contact a poison control center or emergency room at once. NOTE: This medicine is only for you. Do not share this medicine with others. What if I miss a dose? It is important not to miss your dose. Call your doctor or health care professional if you are unable to keep an appointment. What may interact with this medicine? Interactions are not expected. This list may not describe all possible interactions. Give your health care provider a list of all the medicines, herbs, non-prescription drugs, or dietary supplements you use. Also tell them if you smoke, drink alcohol, or use illegal drugs. Some items may interact with your medicine. What should I watch for while using this  medicine? Your condition will be monitored carefully while you are receiving this medicine. Visit your prescriber or health care professional for regular checks on your progress and for the needed blood tests. It is important to keep all appointments. What side effects may I notice from receiving this medicine? Side effects that you should report to your doctor or health care professional as soon as possible: -allergic reactions like skin rash, itching or hives, swelling of the face, lips, or tongue -shortness of breath, chest pain, swelling in a leg -unusual bleeding or bruising Side effects that usually do not require medical attention (report to your doctor or health care professional if they continue or are bothersome): -dizziness -headache -muscle aches -pain in arms and legs -stomach pain -trouble sleeping This list may not describe all possible side effects. Call your doctor for medical advice about side effects. You may report side effects to FDA at 1-800-FDA-1088. Where should I keep my medicine? This drug is given in a hospital or clinic and will not be stored at home. NOTE: This sheet is a summary. It may not cover all possible information. If you have questions about this medicine, talk to your doctor, pharmacist, or health care provider.  2018 Elsevier/Gold Standard (2008-02-05 15:13:04)  

## 2017-10-11 ENCOUNTER — Ambulatory Visit (INDEPENDENT_AMBULATORY_CARE_PROVIDER_SITE_OTHER): Payer: Medicare Other

## 2017-10-11 DIAGNOSIS — E538 Deficiency of other specified B group vitamins: Secondary | ICD-10-CM | POA: Diagnosis not present

## 2017-10-11 MED ORDER — CYANOCOBALAMIN 1000 MCG/ML IJ SOLN
1000.0000 ug | Freq: Once | INTRAMUSCULAR | Status: AC
  Administered 2017-10-11: 1000 ug via INTRAMUSCULAR

## 2017-10-11 NOTE — Progress Notes (Signed)
Patient was in office to receive his b12 injection. Injection given in left deltoid patient tolerated well

## 2017-10-13 ENCOUNTER — Inpatient Hospital Stay: Payer: Medicare Other

## 2017-10-13 VITALS — BP 134/59 | HR 64 | Temp 97.7°F | Resp 18

## 2017-10-13 DIAGNOSIS — D46C Myelodysplastic syndrome with isolated del(5q) chromosomal abnormality: Secondary | ICD-10-CM

## 2017-10-13 DIAGNOSIS — N183 Chronic kidney disease, stage 3 unspecified: Secondary | ICD-10-CM

## 2017-10-13 DIAGNOSIS — D693 Immune thrombocytopenic purpura: Secondary | ICD-10-CM

## 2017-10-13 DIAGNOSIS — D631 Anemia in chronic kidney disease: Secondary | ICD-10-CM | POA: Diagnosis not present

## 2017-10-13 DIAGNOSIS — D539 Nutritional anemia, unspecified: Secondary | ICD-10-CM

## 2017-10-13 DIAGNOSIS — D696 Thrombocytopenia, unspecified: Secondary | ICD-10-CM

## 2017-10-13 LAB — CBC WITH DIFFERENTIAL/PLATELET
BASOS PCT: 0 %
Basophils Absolute: 0 10*3/uL (ref 0.0–0.1)
Eosinophils Absolute: 0 10*3/uL (ref 0.0–0.5)
Eosinophils Relative: 1 %
HEMATOCRIT: 36.4 % — AB (ref 38.4–49.9)
HEMOGLOBIN: 10.7 g/dL — AB (ref 13.0–17.1)
LYMPHS PCT: 58 %
Lymphs Abs: 1.9 10*3/uL (ref 0.9–3.3)
MCH: 23.8 pg — ABNORMAL LOW (ref 27.2–33.4)
MCHC: 29.4 g/dL — ABNORMAL LOW (ref 32.0–36.0)
MCV: 81.1 fL (ref 79.3–98.0)
MONOS PCT: 13 %
Monocytes Absolute: 0.4 10*3/uL (ref 0.1–0.9)
NEUTROS ABS: 0.9 10*3/uL — AB (ref 1.5–6.5)
NEUTROS PCT: 28 %
Platelets: 131 10*3/uL — ABNORMAL LOW (ref 140–400)
RBC: 4.49 MIL/uL (ref 4.20–5.82)
RDW: 26.2 % — ABNORMAL HIGH (ref 11.0–14.6)
WBC: 3.2 10*3/uL — ABNORMAL LOW (ref 4.0–10.3)

## 2017-10-13 MED ORDER — DARBEPOETIN ALFA 200 MCG/0.4ML IJ SOSY
200.0000 ug | PREFILLED_SYRINGE | Freq: Once | INTRAMUSCULAR | Status: AC
  Administered 2017-10-13: 200 ug via SUBCUTANEOUS

## 2017-10-13 MED ORDER — ROMIPLOSTIM INJECTION 500 MCG
425.0000 ug | SUBCUTANEOUS | Status: DC
  Administered 2017-10-13: 425 ug via SUBCUTANEOUS
  Filled 2017-10-13: qty 0.85

## 2017-10-17 ENCOUNTER — Ambulatory Visit (INDEPENDENT_AMBULATORY_CARE_PROVIDER_SITE_OTHER): Payer: Medicare Other | Admitting: Podiatry

## 2017-10-17 DIAGNOSIS — B351 Tinea unguium: Secondary | ICD-10-CM | POA: Diagnosis not present

## 2017-10-17 DIAGNOSIS — M79674 Pain in right toe(s): Secondary | ICD-10-CM

## 2017-10-17 DIAGNOSIS — M79675 Pain in left toe(s): Secondary | ICD-10-CM

## 2017-10-17 DIAGNOSIS — D689 Coagulation defect, unspecified: Secondary | ICD-10-CM

## 2017-10-17 DIAGNOSIS — I739 Peripheral vascular disease, unspecified: Secondary | ICD-10-CM

## 2017-10-17 DIAGNOSIS — M79676 Pain in unspecified toe(s): Secondary | ICD-10-CM

## 2017-10-19 NOTE — Progress Notes (Signed)
Subjective:   Patient ID: Ricardo Watkins, male   DOB: 84 y.o.   MRN: [id number]   HPI Patient presents with nail disease 1-5 both feet with at risk factors and on blood thinner   ROS      Objective:  Physical Exam  Neurovascular status change with thick yellow brittle nailbeds 1-5 both feet that are painful with incurvated corners and history of blood thinner     Assessment:  Mycotic nail infection with pain 1-5 both feet with at risk patient     Plan:  Debridement of painful nailbeds 1-5 both feet with no iatrogenic bleeding noted

## 2017-10-20 ENCOUNTER — Inpatient Hospital Stay: Payer: Medicare Other

## 2017-10-20 ENCOUNTER — Inpatient Hospital Stay: Payer: Medicare Other | Attending: Hematology and Oncology

## 2017-10-20 VITALS — BP 165/75 | HR 78 | Temp 97.5°F | Resp 19

## 2017-10-20 DIAGNOSIS — D696 Thrombocytopenia, unspecified: Secondary | ICD-10-CM

## 2017-10-20 DIAGNOSIS — D46C Myelodysplastic syndrome with isolated del(5q) chromosomal abnormality: Secondary | ICD-10-CM

## 2017-10-20 DIAGNOSIS — N183 Chronic kidney disease, stage 3 unspecified: Secondary | ICD-10-CM

## 2017-10-20 DIAGNOSIS — D693 Immune thrombocytopenic purpura: Secondary | ICD-10-CM | POA: Diagnosis not present

## 2017-10-20 DIAGNOSIS — D631 Anemia in chronic kidney disease: Secondary | ICD-10-CM

## 2017-10-20 DIAGNOSIS — D539 Nutritional anemia, unspecified: Secondary | ICD-10-CM

## 2017-10-20 LAB — CBC WITH DIFFERENTIAL/PLATELET
BAND NEUTROPHILS: 0 %
BASOS ABS: 0 10*3/uL (ref 0.0–0.1)
BLASTS: 0 %
Basophils Relative: 0 %
EOS ABS: 0 10*3/uL (ref 0.0–0.5)
Eosinophils Relative: 0 %
HEMATOCRIT: 38.4 % (ref 38.4–49.9)
Hemoglobin: 11.3 g/dL — ABNORMAL LOW (ref 13.0–17.1)
LYMPHS ABS: 1.7 10*3/uL (ref 0.9–3.3)
Lymphocytes Relative: 60 %
MCH: 23.7 pg — ABNORMAL LOW (ref 27.2–33.4)
MCHC: 29.4 g/dL — ABNORMAL LOW (ref 32.0–36.0)
MCV: 80.7 fL (ref 79.3–98.0)
METAMYELOCYTES PCT: 0 %
MONOS PCT: 13 %
MYELOCYTES: 0 %
Monocytes Absolute: 0.4 10*3/uL (ref 0.1–0.9)
Neutro Abs: 0.8 10*3/uL — ABNORMAL LOW (ref 1.5–6.5)
Neutrophils Relative %: 27 %
Other: 0 %
PLATELETS: 121 10*3/uL — AB (ref 140–400)
Promyelocytes Relative: 0 %
RBC: 4.76 MIL/uL (ref 4.20–5.82)
RDW: 26.4 % — AB (ref 11.0–14.6)
WBC: 2.9 10*3/uL — ABNORMAL LOW (ref 4.0–10.3)
nRBC: 0 /100 WBC

## 2017-10-20 MED ORDER — ROMIPLOSTIM INJECTION 500 MCG
425.0000 ug | SUBCUTANEOUS | Status: DC
  Administered 2017-10-20: 425 ug via SUBCUTANEOUS
  Filled 2017-10-20: qty 0.85

## 2017-10-20 NOTE — Patient Instructions (Signed)
Romiplostim injection What is this medicine? ROMIPLOSTIM (roe mi PLOE stim) helps your body make more platelets. This medicine is used to treat low platelets caused by chronic idiopathic thrombocytopenic purpura (ITP). This medicine may be used for other purposes; ask your health care provider or pharmacist if you have questions. COMMON BRAND NAME(S): Nplate What should I tell my health care provider before I take this medicine? They need to know if you have any of these conditions: -cancer or myelodysplastic syndrome -low blood counts, like low white cell, platelet, or red cell counts -take medicines that treat or prevent blood clots -an unusual or allergic reaction to romiplostim, mannitol, other medicines, foods, dyes, or preservatives -pregnant or trying to get pregnant -breast-feeding How should I use this medicine? This medicine is for injection under the skin. It is given by a health care professional in a hospital or clinic setting. A special MedGuide will be given to you before your injection. Read this information carefully each time. Talk to your pediatrician regarding the use of this medicine in children. Special care may be needed. Overdosage: If you think you have taken too much of this medicine contact a poison control center or emergency room at once. NOTE: This medicine is only for you. Do not share this medicine with others. What if I miss a dose? It is important not to miss your dose. Call your doctor or health care professional if you are unable to keep an appointment. What may interact with this medicine? Interactions are not expected. This list may not describe all possible interactions. Give your health care provider a list of all the medicines, herbs, non-prescription drugs, or dietary supplements you use. Also tell them if you smoke, drink alcohol, or use illegal drugs. Some items may interact with your medicine. What should I watch for while using this  medicine? Your condition will be monitored carefully while you are receiving this medicine. Visit your prescriber or health care professional for regular checks on your progress and for the needed blood tests. It is important to keep all appointments. What side effects may I notice from receiving this medicine? Side effects that you should report to your doctor or health care professional as soon as possible: -allergic reactions like skin rash, itching or hives, swelling of the face, lips, or tongue -shortness of breath, chest pain, swelling in a leg -unusual bleeding or bruising Side effects that usually do not require medical attention (report to your doctor or health care professional if they continue or are bothersome): -dizziness -headache -muscle aches -pain in arms and legs -stomach pain -trouble sleeping This list may not describe all possible side effects. Call your doctor for medical advice about side effects. You may report side effects to FDA at 1-800-FDA-1088. Where should I keep my medicine? This drug is given in a hospital or clinic and will not be stored at home. NOTE: This sheet is a summary. It may not cover all possible information. If you have questions about this medicine, talk to your doctor, pharmacist, or health care provider.  2018 Elsevier/Gold Standard (2008-02-05 15:13:04)  

## 2017-10-26 ENCOUNTER — Ambulatory Visit (INDEPENDENT_AMBULATORY_CARE_PROVIDER_SITE_OTHER): Payer: Medicare Other | Admitting: Internal Medicine

## 2017-10-26 ENCOUNTER — Encounter: Payer: Self-pay | Admitting: Internal Medicine

## 2017-10-26 ENCOUNTER — Ambulatory Visit (INDEPENDENT_AMBULATORY_CARE_PROVIDER_SITE_OTHER)
Admission: RE | Admit: 2017-10-26 | Discharge: 2017-10-26 | Disposition: A | Discharge status: Home / Self Care | Payer: Medicare Other | Source: Ambulatory Visit | Attending: Internal Medicine | Admitting: Internal Medicine

## 2017-10-26 VITALS — BP 140/74 | HR 75 | Ht 69.0 in | Wt 190.0 lb

## 2017-10-26 DIAGNOSIS — R9389 Abnormal findings on diagnostic imaging of other specified body structures: Secondary | ICD-10-CM

## 2017-10-26 DIAGNOSIS — R058 Other specified cough: Secondary | ICD-10-CM

## 2017-10-26 DIAGNOSIS — R05 Cough: Secondary | ICD-10-CM

## 2017-10-26 NOTE — Patient Instructions (Addendum)
Zyrtec 10 mg at bedtime as needed for drippy nose - available over the counter   Please remember to go to the  x-ray department downstairs in the basement  for your tests - we will call you with the results when they are available.      Please schedule a follow up visit in 3 months but call sooner if needed with cxr on return

## 2017-10-26 NOTE — Progress Notes (Signed)
Subjective:    Patient ID: Ricardo Watkins, male    DOB: [birth year],   MRN: [id number]    Brief patient profile:  62 yowm  With remote follicular lymphoma/ now  MDS quit smoking 1960 referred to pulmonary clinic 11/14/2015 by Dr [name] for L pleual effusion first detected 10/30/14     History of Present Illness  11/14/2015 1st [facility] office visit/ [name]   Chief Complaint  Patient presents with  . Pulmonary Consult    Referred by [name] for eval of pleural effusion. He c/o cough for the past 4-5 months. Cough is esp worse when he lies down and is prod with clear sputum. He has SOB when walking up an incline.   indolent onset progressively severe 24/7 dry coughing fits x 5 months with only mild doe with exertion/ cough worse supine, never productive, no assoc wheeze and no better with  otc cough meds rec Please see patient coordinator before you leave today  to schedule thoracentesis of Your Left side and I will call you with the results Stop acuupril and start diovan 160 mg daily    - L thoracentesis 11/24/2015 > 1.6 liters of hazy, amber fluid >>   Wbc 1859 L/M > P and exudative by Prot, nl glucose/ cyt>> neg/ predominently T cells, no evidence of lymphoma or chylous effusion      -  L pleurex 02/10/16 for non-malignant effusion  -  #28 F chest tube placed 04/08/16 by Lucianne Lei tright for dx empyema during admit:   Admit date: 04/03/2016 Discharge date: 04/20/2016 A    Brief/Interim Summary: 85 y.o.malewith medical history significant of non-Hodgkin's lymphoma, CAD, type 2 diabetes, hyperlipidemia, hypertension, anemia, prostate CA, diverticulosis, cataracts, DVT, chronic cough, chronic left pleural effusion with insertion of left pleural catheter on 11/10/2015 by [name] who comes to the hospital due to fever, chills, worsening fatigue and malaise. Per patient, his daughter and wife, his chest tube drainage has been decreasing his output for the pastseveral days and  he stopped completely the day PTA. He normally has up to 500 mL of drainage every other day. Patient was worked up and found to be septic and have a loculated effusion. TCV following patient, he went to OR on 10/19 and had the non functioning catheter removed and placed a new pigtail. Patient had a second chest tube placed in an adjacent collection on 10/20 which was removed on 10/22, currently with only one chest tube in place with minimal drainage. Initial drainage cultures positive for coag negative staph sensitive to penicillins, on Zosyn.   Discharge Diagnoses:  Sepsis due to loculated Effusion/Empyema from Left Obstructed Pleural Catheter for recurrent and chronic Non Malignant Pleural Effusions with hypoxic respiratory failure - CT Scan on admission showed Moderate-sized loculated left pleural effusion collection with a PleurX drainage catheter in place.Patient was placed on broad-spectrum antibiotics of vancomycin and Zosyn  - Sepsis physiology resolved; Old left PleurX catheter removed by TCTS10/19. Vancomycin was discontinued on 10/22, MRSAPCR negative. - IR placed CT Guided Fluid Drain by PigTail Catheter 10/16 and drained ~550 mL. Patient taken to OR 10/19 with removal of old catheter and placement of a left sided chest tube  - post op had significant leukocytosis, up to 25 >> 23.9 >>17.3>>15.5>>19.9>>15.2>>13.4>>10.5>>8.8 - removed one of thechest tubes10/22 per TCTS - Fluid collected on 10/19 during surgery grew coagulase-negative staph which is pansensitive.  -10/27--d/c zosyn, start augmentin--plan 28 days of abx total (start date 10/14)--last day of  abx 05/01/16           Admit date: 06/02/2017 Discharge date: 06/04/2017    Discharge Diagnoses:      Active Hospital Problems   Diagnosis Date Noted  . Acute kidney injury superimposed on chronic kidney disease ([place]) 06/02/2017  . Flu-like symptoms 06/02/2017  . Elevated lactic acid level 06/02/2017  . CKD  (chronic kidney disease), stage IV ([place]) 12/16/2016  . Chronic ITP (idiopathic thrombocytopenia) (HCC) 07/01/2016  . Diarrhea 04/08/2015  . Pancytopenia ([place]) 04/08/2015  . MDS (myelodysplastic syndrome) with 5q deletion ([place]) 11/20/2014  . Thrombocytopenia ([place]) 04/19/2014  . HTN (hypertension) 08/26/2010  . Diabetes ([place]) 08/25/2010    Resolved Hospital Problems  No resolved problems to display.    Discharge Condition: Stable  Diet recommendation: Heart healthy      Vitals:   06/03/17 2108 06/04/17 0515  BP: 138/77 (!) 146/88  Pulse: 98 92  Resp: (!) 25 (!) 22  Temp: 98.7 F (37.1 C) (!) 97.5 F (36.4 C)  SpO2: 95% 98%    History of present illness:  85 y.o.malewith medical history significantfor MDS, stroke, chronic kidney disease hypertension, diabetes, CAD presents to the emergency department from his PCPs office chief complaint 3 day history of decreased oral intake nausea abdominal pain leading to generalized weakness. Initial evaluation reveals acute on chronic kidney disease,elevated lactic acid mild tachycardia in the setting of dehydration.   Hospital Course:  Principal Problem:   Acute kidney injury superimposed on chronic kidney disease ([place]) Active Problems:   HTN (hypertension)   Diabetes (HCC)   Thrombocytopenia (HCC)   MDS (myelodysplastic syndrome) with 5q deletion (HCC)   Pancytopenia (HCC)   Diarrhea   Chronic ITP (idiopathic thrombocytopenia) (HCC)   CKD (chronic kidney disease), stage IV (HCC)   Flu-like symptoms   Elevated lactic acid level  1. Acute kidney injury superimposed on chronic kidney disease stage III. Resolved  Likely related to decreased oral intake in the setting of a likely viral illness Encourage adequate hydration  #2. Flulike symptoms/elevated lactic acid/generalized weakness. Resolved  Lactic acid trending down to 1.3 from 2.5. Likely related to dehydration in the setting of intermittent nausea/abdominal  pain. Chest x-ray no edema or consolidation. CT of the abdomen with diverticulosis no diverticulitis. Max temp 100.6no leukocytosis hypoxic hemodynamically stable. Influenza negative at PCP office -Home PT       07/26/2017  f/u ov/[name] re:  Re-establish re abn ct chest s/p admit  Chief Complaint  Patient presents with  . pulmonary consult    Referred by Dr. Dennard Schaumann for nodule on CT scan1/2019  doe =  MMRC2 = can't walk a nl pace on a flat grade s sob but does fine slow and flat eg walking at [place] No cough  Sleeping ok  rec Please schedule a follow up visit in 3 months but call sooner if needed with cxr on return     10/26/2017  f/u ov/[name] re: abn ct  Chief Complaint  Patient presents with  . Follow-up    Breathing is overall doing well. He has been coughing more over the past wk- non prod. Cough is esp worse at night and occ wakes him up.   Dyspnea:  MMRC2 = can't walk a nl pace on a flat grade s sob but does fine slow and flat eg still does lowes Cough: x sev weeks assoc rhinitis Sleep: disturbed by cough and sense of pnds  SABA use:  None    No obvious day  to day or daytime variability or assoc excess/ purulent sputum or mucus plugs or hemoptysis or cp or chest tightness, subjective wheeze or overt sinus or hb symptoms. No unusual exposure hx or h/o childhood pna/ asthma or knowledge of premature birth.    Also denies any obvious fluctuation of symptoms with weather or environmental changes or other aggravating or alleviating factors except as outlined above   Current Allergies, Complete Past Medical History, Past Surgical History, Family History, and Social History were reviewed in [employer] record.  ROS  The following are not active complaints unless bolded Hoarseness, sore throat, dysphagia, dental problems, itching, sneezing,  nasal congestion or discharge of excess mucus or purulent secretions, ear ache,   fever, chills, sweats, unintended wt loss  or wt gain, classically pleuritic or exertional cp,  orthopnea pnd or arm/hand swelling  or leg swelling, presyncope, palpitations, abdominal pain, anorexia, nausea, vomiting, diarrhea  or change in bowel habits or change in bladder habits, change in stools or change in urine, dysuria, hematuria,  rash, arthralgias, visual complaints, headache, numbness, weakness or ataxia or problems with walking or coordination,  change in mood or  memory.        Current Meds  Medication Sig  . acetaminophen (TYLENOL) 500 MG tablet Take 1,000 mg by mouth every 6 (six) hours as needed for mild pain, moderate pain or headache.   . clopidogrel (PLAVIX) 75 MG tablet TAKE 1 TABLET BY MOUTH ONCE A DAY WITH BREAKFAST  . cyanocobalamin (,VITAMIN B-12,) 1000 MCG/ML injection Inject 1 mL (1,000 mcg total) into the muscle every 30 (thirty) days.  . furosemide (LASIX) 40 MG tablet TAKE 1 TABLET BY MOUTH ONCE A DAY  . glucose blood test strip Check BS TID  . hydrocortisone cream 1 % Apply 1 application topically at bedtime as needed for itching.  . Insulin Glargine (LANTUS SOLOSTAR) 100 UNIT/ML Solostar Pen INJECT 25 UNITS EVERY NIGHT AT BEDTIME (Patient taking differently: Inject 30 Units into the skin daily at 10 pm. INJECT 25 UNITS EVERY NIGHT AT BEDTIME)  . insulin lispro (HUMALOG) 100 UNIT/ML KiwkPen Inject 5 units [place] only (Patient taking differently: Inject 10 Units into the skin 2 (two) times daily. Inject 5 units Lunch & Dinner only)  . LANTUS SOLOSTAR 100 UNIT/ML Solostar Pen INJECT 25 UNITS EVERY NIGHT AT BEDTIME AS DIRECTED  . meclizine (ANTIVERT) 12.5 MG tablet Take 1 tablet (12.5 mg total) by mouth 3 (three) times daily as needed for dizziness.  . metoCLOPramide (REGLAN) 10 MG tablet Take 1 tablet (10 mg total) by mouth 4 (four) times daily.  . metoprolol succinate (TOPROL-XL) 100 MG 24 hr tablet TAKE 1 TABLET BY MOUTH ONCE A DAY  . Multiple Vitamin (MULTIVITAMIN WITH MINERALS) TABS tablet Take 1  tablet by mouth at bedtime.  Marland Kitchen OVER THE COUNTER MEDICATION Place 1 drop into both eyes daily as needed (dry eyes). Over the counter lubricating eye drop  . SYRINGE-NEEDLE, DISP, 3 ML (LUER LOCK SAFETY SYRINGES) 25G X 1" 3 ML MISC Use with B 12 injections q month  . tamsulosin (FLOMAX) 0.4 MG CAPS capsule TAKE 1 CAPSULE BY MOUTH ONCE DAILY  . traMADol (ULTRAM) 50 MG tablet Take 1 tablet (50 mg total) by mouth every 6 (six) hours as needed.  . vitamin C (ASCORBIC ACID) 500 MG tablet Take 500 mg by mouth daily.                    Objective:  Physical Exam     10/26/2017          190  07/26/2017          197  01/27/2016          194  12/12/2015       191   11/14/15 193 lb 6.4 oz (87.726 kg)  11/13/15 193 lb (87.544 kg)  11/12/15 194 lb 11.2 oz (88.315 kg)     Vital signs reviewed - Note on arrival 02 sats  98% on RA        HEENT: nl dentition, turbinates bilaterally, and oropharynx. Nl external ear canals without cough reflex   NECK :  without JVD/Nodes/TM/ nl carotid upstrokes bilaterally   LUNGS: no acc muscle use,  Nl contour chest with decreased bs in bases  bilaterally without cough on insp or exp maneuvers   CV:  RRR  no s3 or murmur or increase in P2, and no edema   ABD:  soft and nontender with nl inspiratory excursion in the supine position. No bruits or organomegaly appreciated, bowel sounds nl  MS:  Nl gait/ ext warm without deformities, calf tenderness, cyanosis or clubbing No obvious joint restrictions   SKIN: warm and dry without lesions    NEURO:  alert, approp, nl sensorium with  no motor or cerebellar deficits apparent.         CXR PA and Lateral:   10/26/2017 :    I personally reviewed images and   impression as follows:   Small R effusion/ no def as dz or lung nodules            Assessment & Plan:

## 2017-10-27 ENCOUNTER — Encounter: Payer: Self-pay | Admitting: Internal Medicine

## 2017-10-27 ENCOUNTER — Inpatient Hospital Stay: Payer: Medicare Other

## 2017-10-27 VITALS — BP 149/79 | HR 71 | Temp 97.9°F | Resp 18

## 2017-10-27 DIAGNOSIS — N183 Chronic kidney disease, stage 3 unspecified: Secondary | ICD-10-CM

## 2017-10-27 DIAGNOSIS — D693 Immune thrombocytopenic purpura: Secondary | ICD-10-CM | POA: Diagnosis not present

## 2017-10-27 DIAGNOSIS — D696 Thrombocytopenia, unspecified: Secondary | ICD-10-CM

## 2017-10-27 DIAGNOSIS — R058 Other specified cough: Secondary | ICD-10-CM | POA: Insufficient documentation

## 2017-10-27 DIAGNOSIS — D539 Nutritional anemia, unspecified: Secondary | ICD-10-CM

## 2017-10-27 DIAGNOSIS — R05 Cough: Secondary | ICD-10-CM | POA: Insufficient documentation

## 2017-10-27 DIAGNOSIS — D46C Myelodysplastic syndrome with isolated del(5q) chromosomal abnormality: Secondary | ICD-10-CM

## 2017-10-27 DIAGNOSIS — D631 Anemia in chronic kidney disease: Secondary | ICD-10-CM

## 2017-10-27 LAB — CBC WITH DIFFERENTIAL/PLATELET
BASOS PCT: 0 %
Basophils Absolute: 0 10*3/uL (ref 0.0–0.1)
EOS PCT: 0 %
Eosinophils Absolute: 0 10*3/uL (ref 0.0–0.5)
HCT: 37.2 % — ABNORMAL LOW (ref 38.4–49.9)
Hemoglobin: 11 g/dL — ABNORMAL LOW (ref 13.0–17.1)
LYMPHS PCT: 68 %
Lymphs Abs: 1.5 10*3/uL (ref 0.9–3.3)
MCH: 24.1 pg — ABNORMAL LOW (ref 27.2–33.4)
MCHC: 29.6 g/dL — ABNORMAL LOW (ref 32.0–36.0)
MCV: 81.6 fL (ref 79.3–98.0)
MONO ABS: 0.3 10*3/uL (ref 0.1–0.9)
Monocytes Relative: 11 %
NEUTROS ABS: 0.5 10*3/uL — AB (ref 1.5–6.5)
Neutrophils Relative %: 21 %
PLATELETS: 43 10*3/uL — AB (ref 140–400)
RBC: 4.56 MIL/uL (ref 4.20–5.82)
RDW: 26 % — AB (ref 11.0–14.6)
WBC: 2.3 10*3/uL — AB (ref 4.0–10.3)

## 2017-10-27 MED ORDER — ROMIPLOSTIM INJECTION 500 MCG
6.0000 ug/kg | SUBCUTANEOUS | Status: DC
  Administered 2017-10-27: 515 ug via SUBCUTANEOUS
  Filled 2017-10-27: qty 1.03

## 2017-10-27 NOTE — Assessment & Plan Note (Signed)
See ct chest 06/28/17 - cxr 10/26/2017 s macroscopic changes   He has clinically improved over time so unlikely the changes seen on CT represent met ca and were likely inflammatory/ post infectious - will f/u in 3 months to close the loop on this issue   Discussed in detail all the  indications, usual  risks and alternatives  relative to the benefits with patient who agrees to proceed with conservative f/u as outlined

## 2017-10-27 NOTE — Progress Notes (Signed)
Spoke with pt and notified of results per Dr. [name]. Pt verbalized understanding and denied any questions. 

## 2017-10-27 NOTE — Assessment & Plan Note (Signed)
Assoc with nasal congestion and sense of pnds > try zyrtec otc prn and f/u here if not improving

## 2017-10-27 NOTE — Patient Instructions (Signed)
Romiplostim injection What is this medicine? ROMIPLOSTIM (roe mi PLOE stim) helps your body make more platelets. This medicine is used to treat low platelets caused by chronic idiopathic thrombocytopenic purpura (ITP). This medicine may be used for other purposes; ask your health care provider or pharmacist if you have questions. COMMON BRAND NAME(S): Nplate What should I tell my health care provider before I take this medicine? They need to know if you have any of these conditions: -cancer or myelodysplastic syndrome -low blood counts, like low white cell, platelet, or red cell counts -take medicines that treat or prevent blood clots -an unusual or allergic reaction to romiplostim, mannitol, other medicines, foods, dyes, or preservatives -pregnant or trying to get pregnant -breast-feeding How should I use this medicine? This medicine is for injection under the skin. It is given by a health care professional in a hospital or clinic setting. A special MedGuide will be given to you before your injection. Read this information carefully each time. Talk to your pediatrician regarding the use of this medicine in children. Special care may be needed. Overdosage: If you think you have taken too much of this medicine contact a poison control center or emergency room at once. NOTE: This medicine is only for you. Do not share this medicine with others. What if I miss a dose? It is important not to miss your dose. Call your doctor or health care professional if you are unable to keep an appointment. What may interact with this medicine? Interactions are not expected. This list may not describe all possible interactions. Give your health care provider a list of all the medicines, herbs, non-prescription drugs, or dietary supplements you use. Also tell them if you smoke, drink alcohol, or use illegal drugs. Some items may interact with your medicine. What should I watch for while using this  medicine? Your condition will be monitored carefully while you are receiving this medicine. Visit your prescriber or health care professional for regular checks on your progress and for the needed blood tests. It is important to keep all appointments. What side effects may I notice from receiving this medicine? Side effects that you should report to your doctor or health care professional as soon as possible: -allergic reactions like skin rash, itching or hives, swelling of the face, lips, or tongue -shortness of breath, chest pain, swelling in a leg -unusual bleeding or bruising Side effects that usually do not require medical attention (report to your doctor or health care professional if they continue or are bothersome): -dizziness -headache -muscle aches -pain in arms and legs -stomach pain -trouble sleeping This list may not describe all possible side effects. Call your doctor for medical advice about side effects. You may report side effects to FDA at 1-800-FDA-1088. Where should I keep my medicine? This drug is given in a hospital or clinic and will not be stored at home. NOTE: This sheet is a summary. It may not cover all possible information. If you have questions about this medicine, talk to your doctor, pharmacist, or health care provider.  2018 Elsevier/Gold Standard (2008-02-05 15:13:04)  

## 2017-10-31 ENCOUNTER — Ambulatory Visit (INDEPENDENT_AMBULATORY_CARE_PROVIDER_SITE_OTHER): Payer: Medicare Other | Admitting: Family Medicine

## 2017-10-31 ENCOUNTER — Encounter: Payer: Self-pay | Admitting: Family Medicine

## 2017-10-31 VITALS — BP 152/70 | HR 72 | Temp 97.6°F | Resp 22 | Ht 69.0 in | Wt 192.0 lb

## 2017-10-31 DIAGNOSIS — E162 Hypoglycemia, unspecified: Secondary | ICD-10-CM

## 2017-10-31 DIAGNOSIS — K591 Functional diarrhea: Secondary | ICD-10-CM | POA: Diagnosis not present

## 2017-10-31 DIAGNOSIS — R61 Generalized hyperhidrosis: Secondary | ICD-10-CM | POA: Diagnosis not present

## 2017-10-31 MED ORDER — GABAPENTIN 100 MG PO CAPS: 100.0000 mg | ORAL_CAPSULE | Freq: Every day | ORAL | 3 refills | Status: DC

## 2017-10-31 NOTE — Progress Notes (Signed)
Subjective:    Patient ID: Ricardo Watkins, male    DOB: 1931-02-22, 84 y.o.   MRN: [id number]  HPI 08/15/17 Patient is an 84 year old white male with a very complicated past medical history including cancer ([facility]), coronary artery disease, pneumonia, and general declining health.  He presents with general weakness over the last 3-4 days, worsening cough, and subjective fevers.  Wife reports decreased appetite.  He reports declining strength and generally getting weaker.  Vital signs are stable.  However on his exam, he has prominent left basilar rales both heard in the axillary line and posteriorly.  These are not usually present and suggest possible walking pneumonia.  At that time, my plan was: Patient appears to be developing walking pneumonia.  Previously has a history of empyema on the same side however CT scan obtained in January revealed no scar tissue or atelectasis in that area to explain the sounds I am hearing today on exam.  Therefore I will treat the patient is walking pneumonia with Rocephin 1 g IM x1 now and then begin Levaquin 250 mg p.o. daily for 7 days.  This is renally dosed based on his chronic kidney disease.  Reassess the patient in 48 hours.  Encourage the family to push fluids.  If the patient is worsening, he needs to go directly to the hospital.  08/18/17 Patient is here today for recheck.  Patient states he is feeling much better.  He has not had a fever in 24 hours.  His cough has improved.  His fatigue has improved.  His appetite is better.  He denies any shortness of breath or chest pain.  He is seemingly improving and feels like a new.  He has experienced some hypoglycemia in the morning but is not taking a snack at night.  He also complains of bilateral shoulder pain which is a chronic orthopedic condition.  The tramadol helps but does not last sufficiently long enough.  At that time, my plan was: Patient looks much better than his previous office visit.  Complete the  Levaquin.  Increase tramadol to 50 mg every 6 hours for shoulder pain.  Return next week and we will perform cortisone injections in both shoulders once he is completed treatment for his pneumonia.  Begin eating a snack at night to prevent a.m. hypoglycemia.  Shoulder pain began long before he started Levaquin.  10/31/17 Patient presents today with several concerns.  #1 he reports night sweats almost every evening.  There are no fevers associated with them.  However he will wake up having saturated the bed and his wife sleeping next to him due to his sweat.  Patient is currently battling myelodysplastic syndrome and is receiving romiplostim injections.  Recently his white blood cell count started to decline along with his platelet count.  He has recently saw his pulmonologist who repeated a chest x-ray that showed persistent left hilar and basilar opacities that are previously unchanged from his CT scan.  Pulmonology did not feel there was any evidence of infection.  I believe his night sweats are likely multifactorial and related to his underlying bone marrow abnormality.  He is interested in treatment to try to treat the night sweats.  He also reports 3-4 episodes of diarrhea per week.  It will occur sporadically and lead to fecal incontinence.  This has him and his wife are afraid to leave the home.  Frequently he will sore himself which is embarrassing and compromising his quality of life.  He  denies any constipation.  He goes to the bathroom regularly every day and frequently has diarrhea. Past Medical History:  Diagnosis Date  . Adenomatous colon polyp   . CAD (coronary artery disease)    30% LAD Stenosis, 70% ramus intermedius stenosis, treated with PTCA and angioplasty by Dr Albertine Patricia 2004  . Cataract    right eye  . Cough, persistent 11/04/2015  . Deficiency anemia 04/19/2014  . Diverticulosis   . DM (diabetes mellitus) ([place])   . DVT (deep venous thrombosis) ([place])    secondary to surgery  .  Dyslipidemia   . Hyperlipidemia   . Hypertension   . Inguinal hernia    right  . 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 Microcytic anemia 07/07/2015  . 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 Non Hodgkin's lymphoma ([place])   . Pleural effusion, left 11/04/2015  . Prostate CA ([place]) 09/10/2011   Gleason 3+3 R, 3+4 L lobe May 2007 Rx Radioactive seed implants Dr [name]  . PVD (peripheral vascular disease) ([place])    rt renal artery stent  . Renal insufficiency   . Thrombocytopenia ([place])   . Thrombotic stroke ([place]) 09/10/2011   January 18, 2011 infarct genu & post limb R internal capsule - acute; previous lacunar infarcts/extensive white matter dis  . Thyroid nodule    left lower lobe (annual monitoring).  . Vitamin D deficiency    Past Surgical History:  Procedure Laterality Date  . APPENDECTOMY     patient ?  Marland Kitchen CARDIAC CATHETERIZATION    . CHEST TUBE INSERTION Left 02/10/2016   Procedure: INSERTION PLEURAL DRAINAGE CATHETER;  Surgeon: [name], MD;  Location: [facility];  Service: Thoracic;  Laterality: Left;  . CHEST TUBE INSERTION Left 04/08/2016   Procedure: CHEST TUBE INSERTION;  Surgeon: [name], MD;  Location: [facility];  Service: Thoracic;  Laterality: Left;  . COLONOSCOPY    . CORONARY ANGIOPLASTY    . CYSTOURETHROSCOPY     ROBOTIC ARM NUCLETRON SEED IMPLANTATION OF PROSTATE  . EXPLORATORY LAPAROTOMY     For evaluation of lymphoma  . REMOVAL OF PLEURAL DRAINAGE CATHETER Left 04/08/2016   Procedure: REMOVAL OF PLEURAL DRAINAGE CATHETER;  Surgeon: [name], MD;  Location[facility];  Service: Thoracic;  Laterality: Left;   Current Outpatient Medications on File Prior to Visit  Medication Sig Dispense Refill  . acetaminophen (TYLENOL) 500 MG tablet Take 1,000 mg by mouth every 6 (six) hours as needed for mild pain, moderate pain or headache.     . clopidogrel (PLAVIX) 75 MG tablet TAKE 1 TABLET BY MOUTH ONCE A DAY WITH BREAKFAST 90  tablet 3  . cyanocobalamin (,VITAMIN B-12,) 1000 MCG/ML injection Inject 1 mL (1,000 mcg total) into the muscle every 30 (thirty) days. 10 mL 3  . furosemide (LASIX) 40 MG tablet TAKE 1 TABLET BY MOUTH ONCE A DAY 90 tablet 3  . glucose blood test strip Check BS TID 300 each 3  . hydrocortisone cream 1 % Apply 1 application topically at bedtime as needed for itching.    . Insulin Glargine (LANTUS SOLOSTAR) 100 UNIT/ML Solostar Pen INJECT 25 UNITS EVERY NIGHT AT BEDTIME (Patient taking differently: Inject 30 Units into the skin daily at 10 pm. INJECT 25 UNITS EVERY NIGHT AT BEDTIME) 5 pen 3  . meclizine (ANTIVERT) 12.5 MG tablet Take 1 tablet (12.5 mg total) by mouth 3 (three) times daily as needed for dizziness.  30 tablet 0  . metoCLOPramide (REGLAN) 10 MG tablet Take 1 tablet (10 mg total) by mouth 4 (four) times daily. 30 tablet 0  . metoprolol succinate (TOPROL-XL) 100 MG 24 hr tablet TAKE 1 TABLET BY MOUTH ONCE A DAY 90 tablet 3  . Multiple Vitamin (MULTIVITAMIN WITH MINERALS) TABS tablet Take 1 tablet by mouth at bedtime.    Marland Kitchen OVER THE COUNTER MEDICATION Place 1 drop into both eyes daily as needed (dry eyes). Over the counter lubricating eye drop    . SYRINGE-NEEDLE, DISP, 3 ML (LUER LOCK SAFETY SYRINGES) 25G X 1" 3 ML MISC Use with B 12 injections q month 100 each 3  . tamsulosin (FLOMAX) 0.4 MG CAPS capsule TAKE 1 CAPSULE BY MOUTH ONCE DAILY 90 capsule 3  . traMADol (ULTRAM) 50 MG tablet Take 1 tablet (50 mg total) by mouth every 6 (six) hours as needed. 120 tablet 2  . vitamin C (ASCORBIC ACID) 500 MG tablet Take 500 mg by mouth daily.    . insulin lispro (HUMALOG) 100 UNIT/ML KiwkPen Inject 5 units Lunch & Dinner only (Patient not taking: Reported on 10/31/2017) 15 mL 3   Current Facility-Administered Medications on File Prior to Visit  Medication Dose Route Frequency Provider Last Rate Last Dose  . cyanocobalamin ((VITAMIN B-12)) injection 1,000 mcg  1,000 mcg Subcutaneous Q30 days  [name], MD   1,000 mcg at 07/06/16 1430  . romiPLOStim (NPLATE) injection 170 mcg  170 mcg Subcutaneous Weekly [name], Ni, MD   170 mcg at 11/25/16 1345   Allergies  Allergen Reactions  . Glucophage [Metformin Hydrochloride] Other (See Comments)    Chest pain  . Zetia [Ezetimibe] Other (See Comments)    weakness  . Fenofibrate Rash  . Niacin-Lovastatin Er Rash   Social History   Socioeconomic History  . Marital status: Married    Spouse name: Not on file  . Number of children: 4  . Years of education: Not on file  . Highest education level: Not on file  Occupational History  . Occupation: retired  Scientific laboratory technician  . Financial resource strain: Not on file  . Food insecurity:    Worry: Not on file    Inability: Not on file  . Transportation needs:    Medical: Not on file    Non-medical: Not on file  Tobacco Use  . Smoking status: Former Smoker    Packs/day: 0.50    Years: 20.00    Pack years: 10.00    Types: Pipe, Cigarettes    Last attempt to quit: 06/21/1958    Years since quitting: 59.4  . Smokeless tobacco: Never Used  Substance and Sexual Activity  . Alcohol use: No    Alcohol/week: 0.0 oz  . Drug use: No  . Sexual activity: Yes    Partners: Female  Lifestyle  . Physical activity:    Days per week: Not on file    Minutes per session: Not on file  . Stress: Not on file  Relationships  . Social connections:    Talks on phone: Not on file    Gets together: Not on file    Attends religious service: Not on file    Active member of club or organization: Not on file    Attends meetings of clubs or organizations: Not on file    Relationship status: Not on file  . Intimate partner violence:    Fear of current or ex partner: Not on file    Emotionally  abused: Not on file    Physically abused: Not on file    Forced sexual activity: Not on file  Other Topics Concern  . Not on file  Social History Narrative  . Not on file      Review of Systems    Gastrointestinal: Positive for diarrhea.  All other systems reviewed and are negative.      Objective:   Physical Exam  Constitutional: He appears well-developed.  Non-toxic appearance. He has a sickly appearance. He does not appear ill. No distress.  Neck: Neck supple. No JVD present.  Cardiovascular: Normal rate and normal heart sounds.  Pulmonary/Chest: Effort normal and breath sounds normal. No respiratory distress. He has no wheezes. He has no rales.  Abdominal: Soft. Bowel sounds are normal. He exhibits no distension. There is no tenderness. There is no rebound.  Musculoskeletal: He exhibits edema.  Lymphadenopathy:    He has no cervical adenopathy.  Skin: He is not diaphoretic.  Vitals reviewed.         Assessment & Plan:  Night sweats  Functional diarrhea  Hypoglycemia He is recently been having hypoglycemia with blood sugars into the 50s.  I recommended discontinuation of Humalog.  He can continue Lantus 30 units a day which is keeping his sugars between 90 and 200.  Believe this is sufficient control given his age and medical comorbidities.  Regarding his diarrhea, I have recommended a trial of Imodium 1 tablet every morning as a prophylactic dose to control his diarrhea.  Monitor closely for constipation.  I would like to avoid anticholinergics as a means to control his night sweats.  Therefore I will avoid oxybutynin or glycopyrrolate.  Instead I will use gabapentin 100 mg p.o. nightly for night sweats.  This is often use off label in patients with prostate cancer to manage hot flashes related to this.  I am hopeful that this will be beneficial for this patient as well.  Reassess in 2 to 3 weeks.

## 2017-11-03 ENCOUNTER — Inpatient Hospital Stay: Payer: Medicare Other

## 2017-11-03 DIAGNOSIS — D696 Thrombocytopenia, unspecified: Secondary | ICD-10-CM

## 2017-11-03 DIAGNOSIS — D693 Immune thrombocytopenic purpura: Secondary | ICD-10-CM | POA: Diagnosis not present

## 2017-11-03 DIAGNOSIS — D539 Nutritional anemia, unspecified: Secondary | ICD-10-CM

## 2017-11-03 DIAGNOSIS — D46C Myelodysplastic syndrome with isolated del(5q) chromosomal abnormality: Secondary | ICD-10-CM

## 2017-11-03 DIAGNOSIS — N183 Chronic kidney disease, stage 3 unspecified: Secondary | ICD-10-CM

## 2017-11-03 DIAGNOSIS — D631 Anemia in chronic kidney disease: Secondary | ICD-10-CM

## 2017-11-03 LAB — CBC WITH DIFFERENTIAL/PLATELET
BASOS ABS: 0 10*3/uL (ref 0.0–0.1)
Basophils Relative: 0 %
EOS PCT: 0 %
Eosinophils Absolute: 0 10*3/uL (ref 0.0–0.5)
HCT: 37.4 % — ABNORMAL LOW (ref 38.4–49.9)
Hemoglobin: 11.3 g/dL — ABNORMAL LOW (ref 13.0–17.1)
LYMPHS ABS: 1.7 10*3/uL (ref 0.9–3.3)
Lymphocytes Relative: 54 %
MCH: 24.5 pg — AB (ref 27.2–33.4)
MCHC: 30.2 g/dL — ABNORMAL LOW (ref 32.0–36.0)
MCV: 81.1 fL (ref 79.3–98.0)
MONO ABS: 0.4 10*3/uL (ref 0.1–0.9)
Monocytes Relative: 12 %
Neutro Abs: 1 10*3/uL — ABNORMAL LOW (ref 1.5–6.5)
Neutrophils Relative %: 34 %
Platelets: 36 10*3/uL — ABNORMAL LOW (ref 140–400)
RBC: 4.61 MIL/uL (ref 4.20–5.82)
RDW: 26 % — AB (ref 11.0–14.6)
WBC: 3.1 10*3/uL — AB (ref 4.0–10.3)

## 2017-11-03 MED ORDER — ROMIPLOSTIM INJECTION 500 MCG
600.0000 ug | SUBCUTANEOUS | Status: DC
  Administered 2017-11-03: 600 ug via SUBCUTANEOUS
  Filled 2017-11-03: qty 1

## 2017-11-04 ENCOUNTER — Emergency Department (HOSPITAL_COMMUNITY)
Admission: EM | Admit: 2017-11-04 | Discharge: 2017-11-04 | Disposition: A | Discharge status: Home / Self Care | Payer: Medicare Other | Attending: Emergency Medicine | Admitting: Emergency Medicine

## 2017-11-04 ENCOUNTER — Emergency Department (HOSPITAL_COMMUNITY): Payer: Medicare Other

## 2017-11-04 ENCOUNTER — Encounter (HOSPITAL_COMMUNITY): Payer: Self-pay | Admitting: Emergency Medicine

## 2017-11-04 ENCOUNTER — Other Ambulatory Visit: Payer: Self-pay

## 2017-11-04 DIAGNOSIS — I5031 Acute diastolic (congestive) heart failure: Secondary | ICD-10-CM | POA: Diagnosis not present

## 2017-11-04 DIAGNOSIS — N183 Chronic kidney disease, stage 3 (moderate): Secondary | ICD-10-CM | POA: Insufficient documentation

## 2017-11-04 DIAGNOSIS — Z794 Long term (current) use of insulin: Secondary | ICD-10-CM | POA: Insufficient documentation

## 2017-11-04 DIAGNOSIS — I509 Heart failure, unspecified: Secondary | ICD-10-CM | POA: Diagnosis not present

## 2017-11-04 DIAGNOSIS — R079 Chest pain, unspecified: Secondary | ICD-10-CM | POA: Diagnosis not present

## 2017-11-04 DIAGNOSIS — Z87891 Personal history of nicotine dependence: Secondary | ICD-10-CM | POA: Diagnosis not present

## 2017-11-04 DIAGNOSIS — I130 Hypertensive heart and chronic kidney disease with heart failure and stage 1 through stage 4 chronic kidney disease, or unspecified chronic kidney disease: Secondary | ICD-10-CM | POA: Insufficient documentation

## 2017-11-04 DIAGNOSIS — Z79899 Other long term (current) drug therapy: Secondary | ICD-10-CM | POA: Diagnosis not present

## 2017-11-04 DIAGNOSIS — E119 Type 2 diabetes mellitus without complications: Secondary | ICD-10-CM | POA: Diagnosis not present

## 2017-11-04 DIAGNOSIS — I2510 Atherosclerotic heart disease of native coronary artery without angina pectoris: Secondary | ICD-10-CM | POA: Insufficient documentation

## 2017-11-04 DIAGNOSIS — R0602 Shortness of breath: Secondary | ICD-10-CM | POA: Diagnosis not present

## 2017-11-04 DIAGNOSIS — I110 Hypertensive heart disease with heart failure: Secondary | ICD-10-CM | POA: Diagnosis not present

## 2017-11-04 LAB — CBC
HEMATOCRIT: 40.6 % (ref 39.0–52.0)
Hemoglobin: 11.7 g/dL — ABNORMAL LOW (ref 13.0–17.0)
MCH: 23.4 pg — AB (ref 26.0–34.0)
MCHC: 28.8 g/dL — ABNORMAL LOW (ref 30.0–36.0)
MCV: 81.4 fL (ref 78.0–100.0)
Platelets: 46 10*3/uL — ABNORMAL LOW (ref 150–400)
RBC: 4.99 MIL/uL (ref 4.22–5.81)
RDW: 26.3 % — ABNORMAL HIGH (ref 11.5–15.5)
WBC: 2.5 10*3/uL — AB (ref 4.0–10.5)

## 2017-11-04 LAB — BASIC METABOLIC PANEL
ANION GAP: 9 (ref 5–15)
BUN: 19 mg/dL (ref 6–20)
CO2: 25 mmol/L (ref 22–32)
CREATININE: 1.93 mg/dL — AB (ref 0.61–1.24)
Calcium: 8.6 mg/dL — ABNORMAL LOW (ref 8.9–10.3)
Chloride: 107 mmol/L (ref 101–111)
GFR calc Af Amer: 35 mL/min — ABNORMAL LOW (ref >60)
GFR calc non Af Amer: 30 mL/min — ABNORMAL LOW (ref >60)
GLUCOSE: 195 mg/dL — AB (ref 65–99)
Potassium: 4.1 mmol/L (ref 3.5–5.1)
Sodium: 141 mmol/L (ref 135–145)

## 2017-11-04 LAB — HEPATIC FUNCTION PANEL
ALBUMIN: 3.7 g/dL (ref 3.5–5.0)
ALT: 16 U/L — AB (ref 17–63)
AST: 19 U/L (ref 15–41)
Alkaline Phosphatase: 56 U/L (ref 38–126)
BILIRUBIN DIRECT: 0.1 mg/dL (ref 0.1–0.5)
BILIRUBIN TOTAL: 0.7 mg/dL (ref 0.3–1.2)
Indirect Bilirubin: 0.6 mg/dL (ref 0.3–0.9)
Total Protein: 7.1 g/dL (ref 6.5–8.1)

## 2017-11-04 LAB — I-STAT TROPONIN, ED: Troponin i, poc: 0.01 ng/mL (ref 0.00–0.08)

## 2017-11-04 LAB — LIPASE, BLOOD: Lipase: 26 U/L (ref 11–51)

## 2017-11-04 LAB — BRAIN NATRIURETIC PEPTIDE: B Natriuretic Peptide: 772.4 pg/mL — ABNORMAL HIGH (ref 0.0–100.0)

## 2017-11-04 MED ORDER — POTASSIUM CHLORIDE CRYS ER 20 MEQ PO TBCR: 20.0000 meq | EXTENDED_RELEASE_TABLET | Freq: Every day | ORAL | 0 refills | Status: DC

## 2017-11-04 MED ORDER — FUROSEMIDE 40 MG PO TABS: ORAL_TABLET | ORAL | 0 refills | Status: DC

## 2017-11-04 MED ORDER — FUROSEMIDE 10 MG/ML IJ SOLN
40.0000 mg | Freq: Once | INTRAMUSCULAR | Status: AC
  Administered 2017-11-04: 40 mg via INTRAVENOUS
  Filled 2017-11-04: qty 4

## 2017-11-04 NOTE — Discharge Instructions (Signed)
As we talked about, your symptoms today are due to [place] in your lungs from NOT TAKING yout LASIX  It is VERY important to start taking the lasix today  If you develop worsening cough, shortness of breath, chest pain, or other concerning symptoms, return to the ER immediately

## 2017-11-04 NOTE — ED Notes (Signed)
Pulse Ox 90% while ambulating, with several PVCs.

## 2017-11-04 NOTE — ED Notes (Signed)
Pt verbalized understanding of discharge instructions.

## 2017-11-04 NOTE — ED Notes (Signed)
Patient c/o sob with chest pain states pain is worse when laying down. States he had to sleep in a recliner last pm. C/o night sweats. Was recently started on Gabapin .

## 2017-11-04 NOTE — ED Triage Notes (Signed)
Pt reports epigastric pain and sob that awoke him from sleep this am. Denies any pain radiation pt a/ox4, resp e/u, nad.

## 2017-11-04 NOTE — ED Provider Notes (Addendum)
[facility] EMERGENCY DEPARTMENT Provider Note   CSN: [id number] Arrival date & time: 11/04/17  8144     History   Chief Complaint Chief Complaint  Patient presents with  . Abdominal Pain  . Shortness of Breath    HPI Ricardo Watkins is a 84 y.o. male.  HPI   84 year old male with past medical history as below including non-Hodgkin's lymphoma, hypertension, hyperlipidemia, coronary disease, CHF, here with shortness of breath.  The patient states that for the last several days, he has awoken from sleep with a sensation that he could not breathe.  Associated dull, substernal chest pressure and occasional nausea.  He states that his symptoms seem to improve as he sits up and goes throughout the day.  Of note, the patient recently started gabapentin and he thought it was associated with this, although he is also reportedly stopped his Lasix for the last 3 weeks because it makes him pee too much.  He is gained several pounds over the same time.  Denies any chest pain at rest currently.  No fevers or chills.  No sputum production.  No alleviating factors other than sitting upright.  Symptoms are all worsened when he lays flat.  Past Medical History:  Diagnosis Date  . Adenomatous colon polyp   . CAD (coronary artery disease)    30% LAD Stenosis, 70% ramus intermedius stenosis, treated with PTCA and angioplasty by Dr Albertine Patricia 2004  . Cataract    right eye  . Cough, persistent 11/04/2015  . Deficiency anemia 04/19/2014  . Diverticulosis   . DM (diabetes mellitus) ([place])   . DVT (deep venous thrombosis) ([place])    secondary to surgery  . Dyslipidemia   . Hyperlipidemia   . Hypertension   . Inguinal hernia    right  . 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 Microcytic anemia 07/07/2015  . 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 Non Hodgkin's lymphoma ([place])   . Pleural effusion, left 11/04/2015  . Prostate CA ([place]) 09/10/2011   Gleason  3+3 R, 3+4 L lobe May 2007 Rx Radioactive seed implants Dr [name]  . PVD (peripheral vascular disease) ([place])    rt renal artery stent  . Renal insufficiency   . Thrombocytopenia ([place])   . 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 Thyroid nodule    left lower lobe (annual monitoring).  . Vitamin D deficiency     Patient Active Problem List   Diagnosis Date Noted  . Upper airway cough syndrome 10/27/2017  . Abnormal CT of the chest 07/27/2017  . Flu-like symptoms 06/02/2017  . Elevated lactic acid level 06/02/2017  . Acute kidney injury superimposed on chronic kidney disease ([place]) 06/02/2017  . CKD (chronic kidney disease), stage IV ([place]) 12/16/2016  . Bilateral carotid artery stenosis 12/11/2016  . Dyslipidemia 12/11/2016  . Anemia in chronic kidney disease 07/01/2016  . Chronic ITP (idiopathic thrombocytopenia) ([facility]) 07/01/2016  . B12 deficiency anemia   . Chest tube in place   . Acute diastolic CHF (congestive heart failure) ([place]) 04/16/2016  . Acute respiratory failure with hypoxia ([place]) 04/14/2016  . Empyema lung ([place])   . Type 2 diabetes mellitus ([place]) 04/04/2016  . Empyema ([place]) 04/03/2016  . Thyroid nodule 03/03/2016  . Sepsis ([place]) 02/17/2016  . Dyspnea 01/27/2016  . Pulmonary edema  with congestive heart failure ([place]) 11/12/2015  . Cough, persistent 11/04/2015  . Pleural effusion 11/04/2015  . Anorexia 08/08/2015  . Microcytic anemia 07/07/2015  . Pancytopenia ([place]) 04/08/2015  . Diarrhea 04/08/2015  . Encounter for chemotherapy management 04/02/2015  . Neutropenia ([place]) 02/18/2015  . 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 Deficiency anemia 04/19/2014  . Thrombocytopenia ([place]) 04/19/2014  . Vitamin B12 deficiency 04/19/2014  . Chronic renal failure, stage 3 (moderate) ([place]) 04/19/2014  . Macrocytic anemia 03/20/2013   . Monocytosis 03/20/2013  . Thrombotic stroke ([place]) 09/10/2011  . Prostate CA ([place]) 09/10/2011  . Inguinal hernia   . Colon polyps   . CAD (coronary artery disease) 08/26/2010  . HTN (hypertension) 08/26/2010  . Murmur 08/26/2010  . MURMUR 08/26/2010  . History of lymphoma 08/25/2010  . Diabetes ([place]) 08/25/2010  . DYSLIPIDEMIA 08/25/2010  . THROMBOCYTOPENIA 08/25/2010  . Essential hypertension 08/25/2010  . Coronary atherosclerosis 08/25/2010  . PVD 08/25/2010    Past Surgical History:  Procedure Laterality Date  . APPENDECTOMY     patient ?  Marland Kitchen CARDIAC CATHETERIZATION    . CHEST TUBE INSERTION Left 02/10/2016   Procedure: INSERTION PLEURAL DRAINAGE CATHETER;  Surgeon: [name], MD;  Location: [facility];  Service: Thoracic;  Laterality: Left;  . CHEST TUBE INSERTION Left 04/08/2016   Procedure: CHEST TUBE INSERTION;  Surgeon: [name], MD;  Location: [facility];  Service: Thoracic;  Laterality: Left;  . COLONOSCOPY    . CORONARY ANGIOPLASTY    . CYSTOURETHROSCOPY     ROBOTIC ARM NUCLETRON SEED IMPLANTATION OF PROSTATE  . EXPLORATORY LAPAROTOMY     For evaluation of lymphoma  . REMOVAL OF PLEURAL DRAINAGE CATHETER Left 04/08/2016   Procedure: REMOVAL OF PLEURAL DRAINAGE CATHETER;  Surgeon: [name], MD;  Location: [facility];  Service: Thoracic;  Laterality: Left;        Home Medications    Prior to Admission medications   Medication Sig Start Date End Date Taking? Authorizing Provider  acetaminophen (TYLENOL) 500 MG tablet Take 1,000 mg by mouth every 6 (six) hours as needed for mild pain, moderate pain or headache.    Yes [provider]  calamine lotion Apply 1 application topically as needed for itching.   Yes [provider]  clopidogrel (PLAVIX) 75 MG tablet TAKE 1 TABLET BY MOUTH ONCE A DAY WITH BREAKFAST 04/18/17  Yes [name], MD  cyanocobalamin (,VITAMIN B-12,) 1000 MCG/ML injection Inject 1 mL (1,000 mcg total) into the  muscle every 30 (thirty) days. 06/29/16  Yes [name], MD  gabapentin (NEURONTIN) 100 MG capsule Take 1 capsule (100 mg total) by mouth at bedtime. 10/31/17  Yes [name], MD  hydrocortisone cream 1 % Apply 1 application topically at bedtime as needed for itching.   Yes [provider]  Insulin Glargine (LANTUS SOLOSTAR) 100 UNIT/ML Solostar Pen INJECT 25 UNITS EVERY NIGHT AT BEDTIME Patient taking differently: Inject 30 Units into the skin every morning.  06/09/16  Yes [name], MD  loperamide (IMODIUM) 2 MG capsule Take 2 mg by mouth as needed for diarrhea or loose stools.   Yes [provider]  meclizine (ANTIVERT) 12.5 MG tablet Take 1 tablet (12.5 mg total) by mouth 3 (three) times daily as needed for dizziness. 06/27/15  Yes [name], MD  metoprolol succinate (TOPROL-XL) 100 MG 24 hr tablet TAKE 1 TABLET BY MOUTH ONCE A DAY 01/10/17  Yes Pickard,  Cammie Mcgee, MD  Multiple Vitamin (MULTIVITAMIN WITH MINERALS) TABS tablet Take 1 tablet by mouth at bedtime.   Yes [provider]  NON FORMULARY Inject 1 Dose into the vein once a week. Platelet booster   Yes [provider]  OVER THE COUNTER MEDICATION Place 1 drop into both eyes daily as needed (dry eyes). Over the counter lubricating eye drop   Yes [provider]  tamsulosin (FLOMAX) 0.4 MG CAPS capsule TAKE 1 CAPSULE BY MOUTH ONCE DAILY 07/13/17  Yes [name], MD  traMADol (ULTRAM) 50 MG tablet Take 1 tablet (50 mg total) by mouth every 6 (six) hours as needed. Patient taking differently: Take 50 mg by mouth every 6 (six) hours as needed for moderate pain.  08/18/17  Yes [name], MD  furosemide (LASIX) 40 MG tablet TAKE 1 TABLET BY MOUTH ONCE A DAY 11/01/16   [name], MD  insulin lispro (HUMALOG) 100 UNIT/ML KiwkPen Inject 5 units Lunch & Dinner only Patient not taking: Reported on 10/31/2017 07/15/17   [name], MD  metoCLOPramide (REGLAN)  10 MG tablet Take 1 tablet (10 mg total) by mouth 4 (four) times daily. Patient not taking: Reported on 11/04/2017 06/13/17   [name], MD    Family History Family History  Problem Relation Age of Onset  . Lymphoma Sister   . Prostate cancer Brother   . Breast cancer Sister   . Diabetes Brother   . Diabetes Sister   . Heart disease Brother   . Asthma Son     Social History Social History   Tobacco Use  . Smoking status: Former Smoker    Packs/day: 0.50    Years: 20.00    Pack years: 10.00    Types: Pipe, Cigarettes    Last attempt to quit: 06/21/1958    Years since quitting: 59.4  . Smokeless tobacco: Never Used  Substance Use Topics  . Alcohol use: No    Alcohol/week: 0.0 oz  . Drug use: No     Allergies   Glucophage [metformin hydrochloride]; Zetia [ezetimibe]; Fenofibrate; and Niacin-lovastatin er   Review of Systems Review of Systems  Constitutional: Positive for fatigue. Negative for chills and fever.  HENT: Negative for congestion, ear pain, rhinorrhea and sore throat.   Eyes: Negative for pain and visual disturbance.  Respiratory: Positive for cough and shortness of breath. Negative for wheezing.   Cardiovascular: Positive for chest pain and leg swelling. Negative for palpitations.  Gastrointestinal: Negative for abdominal pain, diarrhea, nausea and vomiting.  Genitourinary: Negative for dysuria, flank pain and hematuria.  Musculoskeletal: Negative for arthralgias, back pain, neck pain and neck stiffness.  Skin: Negative for color change, rash and wound.  Allergic/Immunologic: Negative for immunocompromised state.  Neurological: Negative for seizures, syncope, weakness and headaches.  All other systems reviewed and are negative.    Physical Exam Updated Vital Signs BP 140/89   Pulse 70   Temp 97.8 F (36.6 C) (Oral)   Resp 20   SpO2 92%   Physical Exam  Constitutional: He is oriented to person, place, and time. He appears well-developed  and well-nourished. No distress.  HENT:  Head: Normocephalic and atraumatic.  Eyes: Conjunctivae are normal.  Neck: Neck supple.  Cardiovascular: Normal rate, regular rhythm and normal heart sounds. Exam reveals no friction rub.  No murmur heard. Pulmonary/Chest: Effort normal. No respiratory distress. He has no wheezes. He has rales (Bibasilar rales, left greater than right).  Abdominal: He  exhibits no distension.  Musculoskeletal: He exhibits edema (2+, pitting).  Neurological: He is alert and oriented to person, place, and time. He exhibits normal muscle tone.  Skin: Skin is warm. Capillary refill takes less than 2 seconds.  Psychiatric: He has a normal mood and affect.  Nursing note and vitals reviewed.    ED Treatments / Results  Labs (all labs ordered are listed, but only abnormal results are displayed) Labs Reviewed  BASIC METABOLIC PANEL - Abnormal; Notable for the following components:      Result Value   Glucose, Bld 195 (*)    Creatinine, Ser 1.93 (*)    Calcium 8.6 (*)    GFR calc non Af Amer 30 (*)    GFR calc Af Amer 35 (*)    All other components within normal limits  CBC - Abnormal; Notable for the following components:   WBC 2.5 (*)    Hemoglobin 11.7 (*)    MCH 23.4 (*)    MCHC 28.8 (*)    RDW 26.3 (*)    Platelets 46 (*)    All other components within normal limits  BRAIN NATRIURETIC PEPTIDE - Abnormal; Notable for the following components:   B Natriuretic Peptide 772.4 (*)    All other components within normal limits  HEPATIC FUNCTION PANEL - Abnormal; Notable for the following components:   ALT 16 (*)    All other components within normal limits  LIPASE, BLOOD  I-STAT TROPONIN, ED    EKG EKG Interpretation  Date/Time:  Friday Nov 04 2017 05:42:14 EDT Ventricular Rate:  89 PR Interval:  162 QRS Duration: 118 QT Interval:  398 QTC Calculation: 484 R Axis:   -72 Text Interpretation:  Normal sinus rhythm Left anterior fascicular block Septal  infarct , age undetermined Abnormal ECG Since last EKG, ST depression in V6 is more evident, though previously noted Confirmed by Duffy Bruce [phone]) on 11/04/2017 9:09:40 AM   Radiology Dg Chest 2 View  Result Date: 11/04/2017 CLINICAL DATA:  Acute onset of epigastric abdominal pain and shortness of breath. EXAM: CHEST - 2 VIEW COMPARISON:  Chest radiograph performed 10/26/2017 FINDINGS: The lungs are well-aerated. Vascular congestion is noted. Increased interstitial markings raise concern for pulmonary edema. A small left pleural effusion is noted. There is no evidence of pneumothorax. The heart is borderline normal in size. No acute osseous abnormalities are seen. IMPRESSION: Vascular congestion noted. Increased interstitial markings raise concern for pulmonary edema. Small left pleural effusion noted. Electronically Signed   By: Garald Balding M.D.   On: 11/04/2017 06:22    Procedures Procedures (including critical care time)  Medications Ordered in ED Medications  furosemide (LASIX) injection 40 mg (40 mg Intravenous Given 11/04/17 0958)     Initial Impression / Assessment and Plan / ED Course  I have reviewed the triage vital signs and the nursing notes.  Pertinent labs & imaging results that were available during my care of the patient were reviewed by me and considered in my medical decision making (see chart for details).     84 year old male here with orthopnea and shortness of breath.  Suspect CHF exacerbation in the setting of nonadherence with Lasix.  BNP elevated at 772.  Given his borderline low oxygen sats and increased work of breathing, will admit.  ADDENDUM: Patient has put out greater than 500 cc with his Lasix.  He feels markedly improved.  He is lying flat with no dyspnea.  He said no chest pain here.  He  is able to ambulate without difficulty.  He is requesting to be discharged home which I feel is reasonable.  I will double his Lasix dose for several days and have  him follow-up with his doctor. I encouraged admission but based on shared decision making with pt, wife, and daughter, pt declines - strict return precautions given.  Final Clinical Impressions(s) / ED Diagnoses   Final diagnoses:  Acute on chronic congestive heart failure, unspecified heart failure type [facility])    ED Discharge Orders    None       Duffy Bruce, MD 11/04/17 1110    Duffy Bruce, MD 11/04/17 1134    Duffy Bruce, MD 11/04/17 1158

## 2017-11-08 ENCOUNTER — Encounter: Payer: Self-pay | Admitting: Family Medicine

## 2017-11-08 ENCOUNTER — Ambulatory Visit (INDEPENDENT_AMBULATORY_CARE_PROVIDER_SITE_OTHER): Payer: Medicare Other | Admitting: Family Medicine

## 2017-11-08 ENCOUNTER — Other Ambulatory Visit: Payer: Self-pay

## 2017-11-08 VITALS — BP 128/72 | HR 73 | Temp 97.7°F | Ht 69.0 in | Wt 184.0 lb

## 2017-11-08 DIAGNOSIS — R7989 Other specified abnormal findings of blood chemistry: Secondary | ICD-10-CM | POA: Diagnosis not present

## 2017-11-08 DIAGNOSIS — E538 Deficiency of other specified B group vitamins: Secondary | ICD-10-CM

## 2017-11-08 DIAGNOSIS — J810 Acute pulmonary edema: Secondary | ICD-10-CM

## 2017-11-08 LAB — BASIC METABOLIC PANEL WITH GFR
BUN / CREAT RATIO: 13 (calc) (ref 6–22)
BUN: 34 mg/dL — AB (ref 7–25)
CO2: 28 mmol/L (ref 20–32)
CREATININE: 2.64 mg/dL — AB (ref 0.70–1.11)
Calcium: 9 mg/dL (ref 8.6–10.3)
Chloride: 104 mmol/L (ref 98–110)
GFR, EST AFRICAN AMERICAN: 24 mL/min/{1.73_m2} — AB (ref >=60)
GFR, EST NON AFRICAN AMERICAN: 21 mL/min/{1.73_m2} — AB (ref >=60)
GLUCOSE: 231 mg/dL — AB (ref 65–99)
Potassium: 5.2 mmol/L (ref 3.5–5.3)
Sodium: 141 mmol/L (ref 135–146)

## 2017-11-08 MED ORDER — FUROSEMIDE 40 MG PO TABS: 40.0000 mg | ORAL_TABLET | Freq: Every day | ORAL | 3 refills | Status: DC

## 2017-11-08 NOTE — Progress Notes (Signed)
Subjective:    Patient ID: Ricardo Watkins, male    DOB: 03-18-1931, 84 y.o.   MRN: [id number]  Diarrhea    Congestive Heart Failure    08/15/17 Patient is an 84 year old white male with a very complicated past medical history including cancer ([facility]), coronary artery disease, pneumonia, and general declining health.  He presents with general weakness over the last 3-4 days, worsening cough, and subjective fevers.  Wife reports decreased appetite.  He reports declining strength and generally getting weaker.  Vital signs are stable.  However on his exam, he has prominent left basilar rales both heard in the axillary line and posteriorly.  These are not usually present and suggest possible walking pneumonia.  At that time, my plan was: Patient appears to be developing walking pneumonia.  Previously has a history of empyema on the same side however CT scan obtained in January revealed no scar tissue or atelectasis in that area to explain the sounds I am hearing today on exam.  Therefore I will treat the patient is walking pneumonia with Rocephin 1 g IM x1 now and then begin Levaquin 250 mg p.o. daily for 7 days.  This is renally dosed based on his chronic kidney disease.  Reassess the patient in 48 hours.  Encourage the family to push fluids.  If the patient is worsening, he needs to go directly to the hospital.  08/18/17 Patient is here today for recheck.  Patient states he is feeling much better.  He has not had a fever in 24 hours.  His cough has improved.  His fatigue has improved.  His appetite is better.  He denies any shortness of breath or chest pain.  He is seemingly improving and feels like a new.  He has experienced some hypoglycemia in the morning but is not taking a snack at night.  He also complains of bilateral shoulder pain which is a chronic orthopedic condition.  The tramadol helps but does not last sufficiently long enough.  At that time, my plan was: Patient looks much better than his  previous office visit.  Complete the Levaquin.  Increase tramadol to 50 mg every 6 hours for shoulder pain.  Return next week and we will perform cortisone injections in both shoulders once he is completed treatment for his pneumonia.  Begin eating a snack at night to prevent a.m. hypoglycemia.  Shoulder pain began long before he started Levaquin.  10/31/17 Patient presents today with several concerns.  #1 he reports night sweats almost every evening.  There are no fevers associated with them.  However he will wake up having saturated the bed and his wife sleeping next to him due to his sweat.  Patient is currently battling myelodysplastic syndrome and is receiving romiplostim injections.  Recently his white blood cell count started to decline along with his platelet count.  He has recently saw his pulmonologist who repeated a chest x-ray that showed persistent left hilar and basilar opacities that are previously unchanged from his CT scan.  Pulmonology did not feel there was any evidence of infection.  I believe his night sweats are likely multifactorial and related to his underlying bone marrow abnormality.  He is interested in treatment to try to treat the night sweats.  He also reports 3-4 episodes of diarrhea per week.  It will occur sporadically and lead to fecal incontinence.  This has him and his wife are afraid to leave the home.  Frequently he will sore himself which is  embarrassing and compromising his quality of life.  He denies any constipation.  He goes to the bathroom regularly every day and frequently has diarrhea.  At that time, my plan was: He is recently been having hypoglycemia with blood sugars into the 50s.  I recommended discontinuation of Humalog.  He can continue Lantus 30 units a day which is keeping his sugars between 90 and 200.  Believe this is sufficient control given his age and medical comorbidities.  Regarding his diarrhea, I have recommended a trial of Imodium 1 tablet every  morning as a prophylactic dose to control his diarrhea.  Monitor closely for constipation.  I would like to avoid anticholinergics as a means to control his night sweats.  Therefore I will avoid oxybutynin or glycopyrrolate.  Instead I will use gabapentin 100 mg p.o. nightly for night sweats.  This is often use off label in patients with prostate cancer to manage hot flashes related to this.  I am hopeful that this will be beneficial for this patient as well.  Reassess in 2 to 3 weeks.   11/08/17 Patient independently discontinued his Lasix due to polyuria.  He was taking 40 mg a day.  He stopped this approximately 3 or 4 days before I saw him last.  On May 17, he developed acute shortness of breath, orthopnea, and leg swelling.  In the emergency room, BNP was elevated over 700 and chest x-ray showed vascular congestion and increased interstitial markings consistent with pulmonary edema.  He received 1 dose of Lasix and diuresed over 500 cc.  At that point his breathing improved and he wanted to go home.  Since going home, he has resumed Lasix and he is lost 8 pounds since his last office visit Past Medical History:  Diagnosis Date  . Adenomatous colon polyp   . CAD (coronary artery disease)    30% LAD Stenosis, 70% ramus intermedius stenosis, treated with PTCA and angioplasty by Dr Albertine Patricia 2004  . Cataract    right eye  . Cough, persistent 11/04/2015  . Deficiency anemia 04/19/2014  . Diverticulosis   . DM (diabetes mellitus) ([place])   . DVT (deep venous thrombosis) ([place])    secondary to surgery  . Dyslipidemia   . Hyperlipidemia   . Hypertension   . Inguinal hernia    right  . 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 Microcytic anemia 07/07/2015  . 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 Non Hodgkin's lymphoma ([place])   . Pleural effusion, left 11/04/2015  . 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 PVD (peripheral vascular disease) ([place])    rt renal artery stent  . Renal insufficiency   . Thrombocytopenia ([place])   . Thrombotic stroke ([place]) 09/10/2011   January 18, 2011 infarct genu & post limb R internal capsule - acute; previous lacunar infarcts/extensive white matter dis  . Thyroid nodule    left lower lobe (annual monitoring).  . Vitamin D deficiency    Past Surgical History:  Procedure Laterality Date  . APPENDECTOMY     patient ?  Marland Kitchen CARDIAC CATHETERIZATION    . CHEST TUBE INSERTION Left 02/10/2016   Procedure: INSERTION PLEURAL DRAINAGE CATHETER;  Surgeon: [name], MD;  Location: [place];  Service: Thoracic;  Laterality: Left;  . CHEST TUBE INSERTION Left 04/08/2016   Procedure: CHEST TUBE INSERTION;  Surgeon: [name], MD;  Location: [place];  Service: Thoracic;  Laterality: Left;  . COLONOSCOPY    . CORONARY ANGIOPLASTY    . CYSTOURETHROSCOPY     ROBOTIC ARM NUCLETRON SEED IMPLANTATION OF PROSTATE  . EXPLORATORY LAPAROTOMY     For evaluation of lymphoma  . REMOVAL OF PLEURAL DRAINAGE CATHETER Left 04/08/2016   Procedure: REMOVAL OF PLEURAL DRAINAGE CATHETER;  Surgeon: [name], MD;  Location: [facility] OR;  Service: Thoracic;  Laterality: Left;   Current Outpatient Medications on File Prior to Visit  Medication Sig Dispense Refill  . acetaminophen (TYLENOL) 500 MG tablet Take 1,000 mg by mouth every 6 (six) hours as needed for mild pain, moderate pain or headache.     . calamine lotion Apply 1 application topically as needed for itching.    . clopidogrel (PLAVIX) 75 MG tablet TAKE 1 TABLET BY MOUTH ONCE A DAY WITH BREAKFAST 90 tablet 3  . cyanocobalamin (,VITAMIN B-12,) 1000 MCG/ML injection Inject 1 mL (1,000 mcg total) into the muscle every 30 (thirty) days. 10 mL 3  . gabapentin (NEURONTIN) 100 MG capsule Take 1 capsule (100 mg total) by mouth at bedtime. 30 capsule 3  . hydrocortisone cream 1 % Apply 1 application topically at bedtime as needed for  itching.    . Insulin Glargine (LANTUS SOLOSTAR) 100 UNIT/ML Solostar Pen INJECT 25 UNITS EVERY NIGHT AT BEDTIME (Patient taking differently: Inject 30 Units into the skin every morning. ) 5 pen 3  . loperamide (IMODIUM) 2 MG capsule Take 2 mg by mouth as needed for diarrhea or loose stools.    . meclizine (ANTIVERT) 12.5 MG tablet Take 1 tablet (12.5 mg total) by mouth 3 (three) times daily as needed for dizziness. 30 tablet 0  . metoprolol succinate (TOPROL-XL) 100 MG 24 hr tablet TAKE 1 TABLET BY MOUTH ONCE A DAY 90 tablet 3  . Multiple Vitamin (MULTIVITAMIN WITH MINERALS) TABS tablet Take 1 tablet by mouth at bedtime.    . NON FORMULARY Inject 1 Dose into the vein once a week. Platelet booster    . OVER THE COUNTER MEDICATION Place 1 drop into both eyes daily as needed (dry eyes). Over the counter lubricating eye drop    . potassium chloride SA (K-DUR,KLOR-CON) 20 MEQ tablet Take 1 tablet (20 mEq total) by mouth daily for 10 days. While taking lasix 10 tablet 0  . tamsulosin (FLOMAX) 0.4 MG CAPS capsule TAKE 1 CAPSULE BY MOUTH ONCE DAILY 90 capsule 3  . traMADol (ULTRAM) 50 MG tablet Take 1 tablet (50 mg total) by mouth every 6 (six) hours as needed. (Patient taking differently: Take 50 mg by mouth every 6 (six) hours as needed for moderate pain. ) 120 tablet 2   Current Facility-Administered Medications on File Prior to Visit  Medication Dose Route Frequency Provider Last Rate Last Dose  . cyanocobalamin ((VITAMIN B-12)) injection 1,000 mcg  1,000 mcg Subcutaneous Q30 days [name], MD   1,000 mcg at 11/08/17 1231  . romiPLOStim (NPLATE) injection 170 mcg  170 mcg Subcutaneous Weekly [name], Ni, MD   170 mcg at 11/25/16 1345   Allergies  Allergen Reactions  . Glucophage [Metformin Hydrochloride] Other (See Comments)    Chest pain  . Zetia [Ezetimibe] Other (See Comments)    weakness  . Fenofibrate Rash  . Niacin-Lovastatin Er Rash   Social History   Socioeconomic History    . Marital status: Married    Spouse name: Not on  file  . Number of children: 4  . Years of education: Not on file  . Highest education level: Not on file  Occupational History  . Occupation: retired  Scientific laboratory technician  . Financial resource strain: Not on file  . Food insecurity:    Worry: Not on file    Inability: Not on file  . Transportation needs:    Medical: Not on file    Non-medical: Not on file  Tobacco Use  . Smoking status: Former Smoker    Packs/day: 0.50    Years: 20.00    Pack years: 10.00    Types: Pipe, Cigarettes    Last attempt to quit: 06/21/1958    Years since quitting: 59.4  . Smokeless tobacco: Never Used  Substance and Sexual Activity  . Alcohol use: No    Alcohol/week: 0.0 oz  . Drug use: No  . Sexual activity: Yes    Partners: Female  Lifestyle  . Physical activity:    Days per week: Not on file    Minutes per session: Not on file  . Stress: Not on file  Relationships  . Social connections:    Talks on phone: Not on file    Gets together: Not on file    Attends religious service: Not on file    Active member of club or organization: Not on file    Attends meetings of clubs or organizations: Not on file    Relationship status: Not on file  . Intimate partner violence:    Fear of current or ex partner: Not on file    Emotionally abused: Not on file    Physically abused: Not on file    Forced sexual activity: Not on file  Other Topics Concern  . Not on file  Social History Narrative  . Not on file      Review of Systems  Gastrointestinal: Positive for diarrhea.  All other systems reviewed and are negative.      Objective:   Physical Exam  Constitutional: He appears well-developed.  Non-toxic appearance. He has a sickly appearance. He does not appear ill. No distress.  Neck: Neck supple. No JVD present.  Cardiovascular: Normal rate and normal heart sounds.  Pulmonary/Chest: Effort normal and breath sounds normal. No respiratory  distress. He has no wheezes. He has no rales.  Abdominal: Soft. Bowel sounds are normal. He exhibits no distension. There is no tenderness. There is no rebound.  Musculoskeletal: He exhibits edema.  Lymphadenopathy:    He has no cervical adenopathy.  Skin: He is not diaphoretic.  Vitals reviewed.         Assessment & Plan:  Acute pulmonary edema ([place]) - Plan: BASIC METABOLIC PANEL WITH GFR, Brain natriuretic peptide Patient appears to be back to his baseline.  There is no respiratory distress.  There is no increased work of breathing.  There are faint left basilar crackles which are chronic in this patient with a chronic pleural effusion on the left side.  I have recommended that he decrease his Lasix dose to 40 mg a day and continue that indefinitely.  I will recheck a BMP today to monitor his electrolytes and also BNP but I believe the patient is near his baseline.  He is not yet seen any improvement with the night sweats on gabapentin.  I have recommended that he discontinue that medication if he has not yet seen any improvement in 1 additional week.  He reports having a difficult time going to the  bathroom.  He has not had a bowel movement since yesterday.  I believe the Imodium that I recommended at his last visit due to his frequent diarrhea is having too much of an effect.  Therefore I recommended that he discontinue the Imodium to avoid constipation.

## 2017-11-09 LAB — BRAIN NATRIURETIC PEPTIDE: Brain Natriuretic Peptide: 266 pg/mL — ABNORMAL HIGH (ref <100)

## 2017-11-10 ENCOUNTER — Other Ambulatory Visit: Payer: Self-pay | Admitting: *Deleted

## 2017-11-10 ENCOUNTER — Inpatient Hospital Stay: Payer: Medicare Other

## 2017-11-10 VITALS — BP 132/72 | HR 74 | Temp 98.2°F | Resp 18

## 2017-11-10 DIAGNOSIS — D696 Thrombocytopenia, unspecified: Secondary | ICD-10-CM

## 2017-11-10 DIAGNOSIS — E860 Dehydration: Secondary | ICD-10-CM

## 2017-11-10 DIAGNOSIS — N184 Chronic kidney disease, stage 4 (severe): Secondary | ICD-10-CM

## 2017-11-10 DIAGNOSIS — N183 Chronic kidney disease, stage 3 unspecified: Secondary | ICD-10-CM

## 2017-11-10 DIAGNOSIS — D693 Immune thrombocytopenic purpura: Secondary | ICD-10-CM

## 2017-11-10 DIAGNOSIS — D631 Anemia in chronic kidney disease: Secondary | ICD-10-CM

## 2017-11-10 DIAGNOSIS — D539 Nutritional anemia, unspecified: Secondary | ICD-10-CM

## 2017-11-10 DIAGNOSIS — D46C Myelodysplastic syndrome with isolated del(5q) chromosomal abnormality: Secondary | ICD-10-CM

## 2017-11-10 LAB — CBC WITH DIFFERENTIAL/PLATELET
Basophils Absolute: 0 10*3/uL (ref 0.0–0.1)
Basophils Relative: 0 %
EOS ABS: 0 10*3/uL (ref 0.0–0.5)
Eosinophils Relative: 1 %
HEMATOCRIT: 36.9 % — AB (ref 38.4–49.9)
HEMOGLOBIN: 10.8 g/dL — AB (ref 13.0–17.1)
LYMPHS ABS: 1.7 10*3/uL (ref 0.9–3.3)
Lymphocytes Relative: 58 %
MCH: 23.8 pg — AB (ref 27.2–33.4)
MCHC: 29.3 g/dL — AB (ref 32.0–36.0)
MCV: 81.3 fL (ref 79.3–98.0)
MONOS PCT: 12 %
Monocytes Absolute: 0.4 10*3/uL (ref 0.1–0.9)
NEUTROS ABS: 0.9 10*3/uL — AB (ref 1.5–6.5)
NEUTROS PCT: 29 %
Platelets: 91 10*3/uL — ABNORMAL LOW (ref 140–400)
RBC: 4.54 MIL/uL (ref 4.20–5.82)
RDW: 26.3 % — ABNORMAL HIGH (ref 11.0–14.6)
WBC: 3 10*3/uL — ABNORMAL LOW (ref 4.0–10.3)

## 2017-11-10 MED ORDER — DARBEPOETIN ALFA 200 MCG/0.4ML IJ SOSY: 200.0000 ug | PREFILLED_SYRINGE | Freq: Once | INTRAMUSCULAR | Status: DC

## 2017-11-10 MED ORDER — ROMIPLOSTIM INJECTION 500 MCG
600.0000 ug | SUBCUTANEOUS | Status: DC
  Administered 2017-11-10: 600 ug via SUBCUTANEOUS
  Filled 2017-11-10: qty 1

## 2017-11-10 MED ORDER — DARBEPOETIN ALFA 200 MCG/0.4ML IJ SOSY
PREFILLED_SYRINGE | INTRAMUSCULAR | Status: AC
  Filled 2017-11-10: qty 0.4

## 2017-11-15 ENCOUNTER — Other Ambulatory Visit: Payer: Medicare Other

## 2017-11-15 DIAGNOSIS — N184 Chronic kidney disease, stage 4 (severe): Secondary | ICD-10-CM | POA: Diagnosis not present

## 2017-11-15 DIAGNOSIS — E860 Dehydration: Secondary | ICD-10-CM

## 2017-11-16 LAB — BASIC METABOLIC PANEL
BUN/Creatinine Ratio: 14 (calc) (ref 6–22)
BUN: 37 mg/dL — AB (ref 7–25)
CALCIUM: 9.2 mg/dL (ref 8.6–10.3)
CO2: 27 mmol/L (ref 20–32)
Chloride: 106 mmol/L (ref 98–110)
Creat: 2.58 mg/dL — ABNORMAL HIGH (ref 0.70–1.11)
GLUCOSE: 175 mg/dL — AB (ref 65–99)
Potassium: 5.4 mmol/L — ABNORMAL HIGH (ref 3.5–5.3)
SODIUM: 140 mmol/L (ref 135–146)

## 2017-11-17 ENCOUNTER — Inpatient Hospital Stay: Payer: Medicare Other

## 2017-11-17 VITALS — BP 139/64 | HR 68 | Temp 97.7°F | Resp 16

## 2017-11-17 DIAGNOSIS — D46C Myelodysplastic syndrome with isolated del(5q) chromosomal abnormality: Secondary | ICD-10-CM

## 2017-11-17 DIAGNOSIS — N183 Chronic kidney disease, stage 3 unspecified: Secondary | ICD-10-CM

## 2017-11-17 DIAGNOSIS — D696 Thrombocytopenia, unspecified: Secondary | ICD-10-CM

## 2017-11-17 DIAGNOSIS — D693 Immune thrombocytopenic purpura: Secondary | ICD-10-CM

## 2017-11-17 DIAGNOSIS — D539 Nutritional anemia, unspecified: Secondary | ICD-10-CM

## 2017-11-17 DIAGNOSIS — D631 Anemia in chronic kidney disease: Secondary | ICD-10-CM

## 2017-11-17 LAB — CBC WITH DIFFERENTIAL/PLATELET
BASOS ABS: 0 10*3/uL (ref 0.0–0.1)
Basophils Relative: 0 %
EOS PCT: 1 %
Eosinophils Absolute: 0 10*3/uL (ref 0.0–0.5)
HCT: 37.2 % — ABNORMAL LOW (ref 38.4–49.9)
Hemoglobin: 11 g/dL — ABNORMAL LOW (ref 13.0–17.1)
LYMPHS PCT: 67 %
Lymphs Abs: 1.8 10*3/uL (ref 0.9–3.3)
MCH: 23.9 pg — ABNORMAL LOW (ref 27.2–33.4)
MCHC: 29.6 g/dL — ABNORMAL LOW (ref 32.0–36.0)
MCV: 80.7 fL (ref 79.3–98.0)
Monocytes Absolute: 0.4 10*3/uL (ref 0.1–0.9)
Monocytes Relative: 17 %
NEUTROS ABS: 0.4 10*3/uL — AB (ref 1.5–6.5)
Neutrophils Relative %: 15 %
PLATELETS: 144 10*3/uL (ref 140–400)
RBC: 4.61 MIL/uL (ref 4.20–5.82)
RDW: 26.1 % — ABNORMAL HIGH (ref 11.0–14.6)
WBC: 2.7 10*3/uL — AB (ref 4.0–10.3)

## 2017-11-17 MED ORDER — PEGFILGRASTIM-CBQV 6 MG/0.6ML ~~LOC~~ SOSY
PREFILLED_SYRINGE | SUBCUTANEOUS | Status: AC
  Filled 2017-11-17: qty 0.6

## 2017-11-17 MED ORDER — DARBEPOETIN ALFA 200 MCG/0.4ML IJ SOSY
PREFILLED_SYRINGE | INTRAMUSCULAR | Status: AC
  Filled 2017-11-17: qty 0.4

## 2017-11-17 MED ORDER — ROMIPLOSTIM INJECTION 500 MCG
600.0000 ug | SUBCUTANEOUS | Status: DC
  Administered 2017-11-17: 600 ug via SUBCUTANEOUS
  Filled 2017-11-17: qty 1.2

## 2017-11-17 NOTE — Progress Notes (Signed)
Per [name], hold Aranesp d/t Hgb 11.0

## 2017-11-17 NOTE — Patient Instructions (Signed)
Romiplostim injection What is this medicine? ROMIPLOSTIM (roe mi PLOE stim) helps your body make more platelets. This medicine is used to treat low platelets caused by chronic idiopathic thrombocytopenic purpura (ITP). This medicine may be used for other purposes; ask your health care provider or pharmacist if you have questions. COMMON BRAND NAME(S): Nplate What should I tell my health care provider before I take this medicine? They need to know if you have any of these conditions: -cancer or myelodysplastic syndrome -low blood counts, like low white cell, platelet, or red cell counts -take medicines that treat or prevent blood clots -an unusual or allergic reaction to romiplostim, mannitol, other medicines, foods, dyes, or preservatives -pregnant or trying to get pregnant -breast-feeding How should I use this medicine? This medicine is for injection under the skin. It is given by a health care professional in a hospital or clinic setting. A special MedGuide will be given to you before your injection. Read this information carefully each time. Talk to your pediatrician regarding the use of this medicine in children. Special care may be needed. Overdosage: If you think you have taken too much of this medicine contact a poison control center or emergency room at once. NOTE: This medicine is only for you. Do not share this medicine with others. What if I miss a dose? It is important not to miss your dose. Call your doctor or health care professional if you are unable to keep an appointment. What may interact with this medicine? Interactions are not expected. This list may not describe all possible interactions. Give your health care provider a list of all the medicines, herbs, non-prescription drugs, or dietary supplements you use. Also tell them if you smoke, drink alcohol, or use illegal drugs. Some items may interact with your medicine. What should I watch for while using this  medicine? Your condition will be monitored carefully while you are receiving this medicine. Visit your prescriber or health care professional for regular checks on your progress and for the needed blood tests. It is important to keep all appointments. What side effects may I notice from receiving this medicine? Side effects that you should report to your doctor or health care professional as soon as possible: -allergic reactions like skin rash, itching or hives, swelling of the face, lips, or tongue -shortness of breath, chest pain, swelling in a leg -unusual bleeding or bruising Side effects that usually do not require medical attention (report to your doctor or health care professional if they continue or are bothersome): -dizziness -headache -muscle aches -pain in arms and legs -stomach pain -trouble sleeping This list may not describe all possible side effects. Call your doctor for medical advice about side effects. You may report side effects to FDA at 1-800-FDA-1088. Where should I keep my medicine? This drug is given in a hospital or clinic and will not be stored at home. NOTE: This sheet is a summary. It may not cover all possible information. If you have questions about this medicine, talk to your doctor, pharmacist, or health care provider.  2018 Elsevier/Gold Standard (2008-02-05 15:13:04)  

## 2017-11-18 ENCOUNTER — Other Ambulatory Visit: Payer: Self-pay | Admitting: Family Medicine

## 2017-11-18 DIAGNOSIS — N289 Disorder of kidney and ureter, unspecified: Secondary | ICD-10-CM

## 2017-11-23 ENCOUNTER — Other Ambulatory Visit: Payer: Self-pay | Admitting: Hematology and Oncology

## 2017-11-23 DIAGNOSIS — J9601 Acute respiratory failure with hypoxia: Secondary | ICD-10-CM

## 2017-11-23 DIAGNOSIS — D46C Myelodysplastic syndrome with isolated del(5q) chromosomal abnormality: Secondary | ICD-10-CM

## 2017-11-23 DIAGNOSIS — D539 Nutritional anemia, unspecified: Secondary | ICD-10-CM

## 2017-11-24 ENCOUNTER — Inpatient Hospital Stay: Payer: Medicare Other | Attending: Hematology and Oncology

## 2017-11-24 ENCOUNTER — Inpatient Hospital Stay: Payer: Medicare Other

## 2017-11-24 DIAGNOSIS — D539 Nutritional anemia, unspecified: Secondary | ICD-10-CM

## 2017-11-24 DIAGNOSIS — D693 Immune thrombocytopenic purpura: Secondary | ICD-10-CM | POA: Diagnosis not present

## 2017-11-24 DIAGNOSIS — N183 Chronic kidney disease, stage 3 unspecified: Secondary | ICD-10-CM

## 2017-11-24 DIAGNOSIS — J9601 Acute respiratory failure with hypoxia: Secondary | ICD-10-CM

## 2017-11-24 DIAGNOSIS — D696 Thrombocytopenia, unspecified: Secondary | ICD-10-CM

## 2017-11-24 DIAGNOSIS — D631 Anemia in chronic kidney disease: Secondary | ICD-10-CM

## 2017-11-24 DIAGNOSIS — D46C Myelodysplastic syndrome with isolated del(5q) chromosomal abnormality: Secondary | ICD-10-CM | POA: Insufficient documentation

## 2017-11-24 LAB — CBC WITH DIFFERENTIAL/PLATELET
Basophils Absolute: 0 10*3/uL (ref 0.0–0.1)
Basophils Relative: 0 %
EOS PCT: 0 %
Eosinophils Absolute: 0 10*3/uL (ref 0.0–0.5)
HEMATOCRIT: 34.6 % — AB (ref 38.4–49.9)
HEMOGLOBIN: 10.5 g/dL — AB (ref 13.0–17.1)
LYMPHS ABS: 1.8 10*3/uL (ref 0.9–3.3)
LYMPHS PCT: 47 %
MCH: 24.1 pg — AB (ref 27.2–33.4)
MCHC: 30.3 g/dL — AB (ref 32.0–36.0)
MCV: 79.4 fL (ref 79.3–98.0)
MONO ABS: 0.5 10*3/uL (ref 0.1–0.9)
Monocytes Relative: 12 %
NEUTROS ABS: 1.6 10*3/uL (ref 1.5–6.5)
Neutrophils Relative %: 41 %
Platelets: 131 10*3/uL — ABNORMAL LOW (ref 140–400)
RBC: 4.36 MIL/uL (ref 4.20–5.82)
RDW: 25.8 % — AB (ref 11.0–14.6)
WBC: 3.9 10*3/uL — AB (ref 4.0–10.3)

## 2017-11-24 MED ORDER — ROMIPLOSTIM INJECTION 500 MCG
600.0000 ug | SUBCUTANEOUS | Status: DC
  Administered 2017-11-24: 600 ug via SUBCUTANEOUS
  Filled 2017-11-24: qty 1

## 2017-11-24 NOTE — Patient Instructions (Signed)
Romiplostim injection What is this medicine? ROMIPLOSTIM (roe mi PLOE stim) helps your body make more platelets. This medicine is used to treat low platelets caused by chronic idiopathic thrombocytopenic purpura (ITP). This medicine may be used for other purposes; ask your health care provider or pharmacist if you have questions. COMMON BRAND NAME(S): Nplate What should I tell my health care provider before I take this medicine? They need to know if you have any of these conditions: -cancer or myelodysplastic syndrome -low blood counts, like low white cell, platelet, or red cell counts -take medicines that treat or prevent blood clots -an unusual or allergic reaction to romiplostim, mannitol, other medicines, foods, dyes, or preservatives -pregnant or trying to get pregnant -breast-feeding How should I use this medicine? This medicine is for injection under the skin. It is given by a health care professional in a hospital or clinic setting. A special MedGuide will be given to you before your injection. Read this information carefully each time. Talk to your pediatrician regarding the use of this medicine in children. Special care may be needed. Overdosage: If you think you have taken too much of this medicine contact a poison control center or emergency room at once. NOTE: This medicine is only for you. Do not share this medicine with others. What if I miss a dose? It is important not to miss your dose. Call your doctor or health care professional if you are unable to keep an appointment. What may interact with this medicine? Interactions are not expected. This list may not describe all possible interactions. Give your health care provider a list of all the medicines, herbs, non-prescription drugs, or dietary supplements you use. Also tell them if you smoke, drink alcohol, or use illegal drugs. Some items may interact with your medicine. What should I watch for while using this  medicine? Your condition will be monitored carefully while you are receiving this medicine. Visit your prescriber or health care professional for regular checks on your progress and for the needed blood tests. It is important to keep all appointments. What side effects may I notice from receiving this medicine? Side effects that you should report to your doctor or health care professional as soon as possible: -allergic reactions like skin rash, itching or hives, swelling of the face, lips, or tongue -shortness of breath, chest pain, swelling in a leg -unusual bleeding or bruising Side effects that usually do not require medical attention (report to your doctor or health care professional if they continue or are bothersome): -dizziness -headache -muscle aches -pain in arms and legs -stomach pain -trouble sleeping This list may not describe all possible side effects. Call your doctor for medical advice about side effects. You may report side effects to FDA at 1-800-FDA-1088. Where should I keep my medicine? This drug is given in a hospital or clinic and will not be stored at home. NOTE: This sheet is a summary. It may not cover all possible information. If you have questions about this medicine, talk to your doctor, pharmacist, or health care provider.  2018 Elsevier/Gold Standard (2008-02-05 15:13:04)  

## 2017-11-25 LAB — ERYTHROPOIETIN: Erythropoietin: 25.6 m[IU]/mL — ABNORMAL HIGH (ref 2.6–18.5)

## 2017-12-01 ENCOUNTER — Inpatient Hospital Stay: Payer: Medicare Other

## 2017-12-01 DIAGNOSIS — D693 Immune thrombocytopenic purpura: Secondary | ICD-10-CM

## 2017-12-01 DIAGNOSIS — D631 Anemia in chronic kidney disease: Secondary | ICD-10-CM

## 2017-12-01 DIAGNOSIS — D696 Thrombocytopenia, unspecified: Secondary | ICD-10-CM

## 2017-12-01 DIAGNOSIS — D539 Nutritional anemia, unspecified: Secondary | ICD-10-CM

## 2017-12-01 DIAGNOSIS — N183 Chronic kidney disease, stage 3 unspecified: Secondary | ICD-10-CM

## 2017-12-01 DIAGNOSIS — D46C Myelodysplastic syndrome with isolated del(5q) chromosomal abnormality: Secondary | ICD-10-CM

## 2017-12-01 DIAGNOSIS — J9601 Acute respiratory failure with hypoxia: Secondary | ICD-10-CM

## 2017-12-01 LAB — CBC WITH DIFFERENTIAL/PLATELET
BASOS ABS: 0 10*3/uL (ref 0.0–0.1)
Basophils Relative: 0 %
EOS ABS: 0 10*3/uL (ref 0.0–0.5)
Eosinophils Relative: 0 %
HCT: 34 % — ABNORMAL LOW (ref 38.4–49.9)
HEMOGLOBIN: 10.2 g/dL — AB (ref 13.0–17.1)
LYMPHS PCT: 55 %
Lymphs Abs: 1.8 10*3/uL (ref 0.9–3.3)
MCH: 23.6 pg — ABNORMAL LOW (ref 27.2–33.4)
MCHC: 30 g/dL — ABNORMAL LOW (ref 32.0–36.0)
MCV: 78.7 fL — ABNORMAL LOW (ref 79.3–98.0)
Monocytes Absolute: 0.5 10*3/uL (ref 0.1–0.9)
Monocytes Relative: 14 %
NEUTROS PCT: 31 %
Neutro Abs: 1 10*3/uL — ABNORMAL LOW (ref 1.5–6.5)
Platelets: 84 10*3/uL — ABNORMAL LOW (ref 140–400)
RBC: 4.32 MIL/uL (ref 4.20–5.82)
RDW: 25.9 % — ABNORMAL HIGH (ref 11.0–14.6)
WBC: 3.3 10*3/uL — AB (ref 4.0–10.3)

## 2017-12-01 MED ORDER — DARBEPOETIN ALFA 200 MCG/0.4ML IJ SOSY
PREFILLED_SYRINGE | INTRAMUSCULAR | Status: AC
  Filled 2017-12-01: qty 0.4

## 2017-12-01 MED ORDER — DARBEPOETIN ALFA 200 MCG/0.4ML IJ SOSY
200.0000 ug | PREFILLED_SYRINGE | Freq: Once | INTRAMUSCULAR | Status: AC
  Administered 2017-12-01: 200 ug via SUBCUTANEOUS

## 2017-12-01 MED ORDER — ROMIPLOSTIM INJECTION 500 MCG
600.0000 ug | SUBCUTANEOUS | Status: DC
  Administered 2017-12-01: 600 ug via SUBCUTANEOUS
  Filled 2017-12-01: qty 1

## 2017-12-02 ENCOUNTER — Other Ambulatory Visit: Payer: Medicare Other

## 2017-12-02 DIAGNOSIS — N289 Disorder of kidney and ureter, unspecified: Secondary | ICD-10-CM

## 2017-12-02 LAB — BASIC METABOLIC PANEL
BUN/Creatinine Ratio: 14 (calc) (ref 6–22)
BUN: 26 mg/dL — AB (ref 7–25)
CALCIUM: 8.9 mg/dL (ref 8.6–10.3)
CO2: 28 mmol/L (ref 20–32)
CREATININE: 1.83 mg/dL — AB (ref 0.70–1.11)
Chloride: 105 mmol/L (ref 98–110)
GLUCOSE: 129 mg/dL — AB (ref 65–99)
Potassium: 4.7 mmol/L (ref 3.5–5.3)
Sodium: 139 mmol/L (ref 135–146)

## 2017-12-08 ENCOUNTER — Inpatient Hospital Stay: Payer: Medicare Other

## 2017-12-08 DIAGNOSIS — D46C Myelodysplastic syndrome with isolated del(5q) chromosomal abnormality: Secondary | ICD-10-CM | POA: Diagnosis not present

## 2017-12-08 DIAGNOSIS — D696 Thrombocytopenia, unspecified: Secondary | ICD-10-CM

## 2017-12-08 DIAGNOSIS — N183 Chronic kidney disease, stage 3 unspecified: Secondary | ICD-10-CM

## 2017-12-08 DIAGNOSIS — J9601 Acute respiratory failure with hypoxia: Secondary | ICD-10-CM

## 2017-12-08 DIAGNOSIS — D631 Anemia in chronic kidney disease: Secondary | ICD-10-CM

## 2017-12-08 DIAGNOSIS — D539 Nutritional anemia, unspecified: Secondary | ICD-10-CM

## 2017-12-08 DIAGNOSIS — D693 Immune thrombocytopenic purpura: Secondary | ICD-10-CM

## 2017-12-08 LAB — CBC WITH DIFFERENTIAL/PLATELET
BASOS PCT: 0 %
Basophils Absolute: 0 10*3/uL (ref 0.0–0.1)
EOS ABS: 0 10*3/uL (ref 0.0–0.5)
Eosinophils Relative: 1 %
HEMATOCRIT: 35.4 % — AB (ref 38.4–49.9)
Hemoglobin: 10.5 g/dL — ABNORMAL LOW (ref 13.0–17.1)
Lymphocytes Relative: 52 %
Lymphs Abs: 1.6 10*3/uL (ref 0.9–3.3)
MCH: 23.9 pg — ABNORMAL LOW (ref 27.2–33.4)
MCHC: 29.7 g/dL — AB (ref 32.0–36.0)
MCV: 80.5 fL (ref 79.3–98.0)
MONO ABS: 0.5 10*3/uL (ref 0.1–0.9)
MONOS PCT: 17 %
Neutro Abs: 0.9 10*3/uL — ABNORMAL LOW (ref 1.5–6.5)
Neutrophils Relative %: 30 %
Platelets: 42 10*3/uL — ABNORMAL LOW (ref 140–400)
RBC: 4.4 MIL/uL (ref 4.20–5.82)
RDW: 26.5 % — ABNORMAL HIGH (ref 11.0–14.6)
WBC: 3 10*3/uL — ABNORMAL LOW (ref 4.0–10.3)

## 2017-12-08 MED ORDER — ROMIPLOSTIM INJECTION 500 MCG
600.0000 ug | SUBCUTANEOUS | Status: DC
Start: 2017-12-08 — End: 2017-12-08
  Administered 2017-12-08: 600 ug via SUBCUTANEOUS
  Filled 2017-12-08: qty 1

## 2017-12-08 MED ORDER — DARBEPOETIN ALFA 200 MCG/0.4ML IJ SOSY
PREFILLED_SYRINGE | INTRAMUSCULAR | Status: AC
Start: 2017-12-08 — End: ?
  Filled 2017-12-08: qty 0.4

## 2017-12-12 ENCOUNTER — Ambulatory Visit (INDEPENDENT_AMBULATORY_CARE_PROVIDER_SITE_OTHER): Payer: Medicare Other | Admitting: Family Medicine

## 2017-12-12 DIAGNOSIS — E538 Deficiency of other specified B group vitamins: Secondary | ICD-10-CM

## 2017-12-15 ENCOUNTER — Inpatient Hospital Stay: Payer: Medicare Other

## 2017-12-15 DIAGNOSIS — D539 Nutritional anemia, unspecified: Secondary | ICD-10-CM

## 2017-12-15 DIAGNOSIS — D693 Immune thrombocytopenic purpura: Secondary | ICD-10-CM | POA: Diagnosis not present

## 2017-12-15 DIAGNOSIS — N183 Chronic kidney disease, stage 3 unspecified: Secondary | ICD-10-CM

## 2017-12-15 DIAGNOSIS — D696 Thrombocytopenia, unspecified: Secondary | ICD-10-CM

## 2017-12-15 DIAGNOSIS — D46C Myelodysplastic syndrome with isolated del(5q) chromosomal abnormality: Secondary | ICD-10-CM | POA: Diagnosis not present

## 2017-12-15 DIAGNOSIS — D631 Anemia in chronic kidney disease: Secondary | ICD-10-CM

## 2017-12-15 DIAGNOSIS — J9601 Acute respiratory failure with hypoxia: Secondary | ICD-10-CM

## 2017-12-15 LAB — CBC WITH DIFFERENTIAL/PLATELET
BASOS ABS: 0 10*3/uL (ref 0.0–0.1)
BASOS PCT: 0 %
Eosinophils Absolute: 0 10*3/uL (ref 0.0–0.5)
Eosinophils Relative: 1 %
HCT: 36.1 % — ABNORMAL LOW (ref 38.4–49.9)
HEMOGLOBIN: 10.7 g/dL — AB (ref 13.0–17.1)
Lymphocytes Relative: 56 %
Lymphs Abs: 1.5 10*3/uL (ref 0.9–3.3)
MCH: 24.1 pg — ABNORMAL LOW (ref 27.2–33.4)
MCHC: 29.6 g/dL — AB (ref 32.0–36.0)
MCV: 81.3 fL (ref 79.3–98.0)
MONO ABS: 0.3 10*3/uL (ref 0.1–0.9)
Monocytes Relative: 12 %
NEUTROS ABS: 0.8 10*3/uL — AB (ref 1.5–6.5)
NEUTROS PCT: 31 %
Platelets: 36 10*3/uL — ABNORMAL LOW (ref 140–400)
RBC: 4.44 MIL/uL (ref 4.20–5.82)
RDW: 27.1 % — ABNORMAL HIGH (ref 11.0–14.6)
WBC: 2.7 10*3/uL — AB (ref 4.0–10.3)
nRBC: 1 /100 WBC — ABNORMAL HIGH

## 2017-12-15 MED ORDER — DARBEPOETIN ALFA 200 MCG/0.4ML IJ SOSY
PREFILLED_SYRINGE | INTRAMUSCULAR | Status: AC
Start: 2017-12-15 — End: ?
  Filled 2017-12-15: qty 0.4

## 2017-12-15 MED ORDER — ROMIPLOSTIM INJECTION 500 MCG
684.0000 ug | SUBCUTANEOUS | Status: DC
  Administered 2017-12-15: 684 ug via SUBCUTANEOUS
  Filled 2017-12-15: qty 1.37

## 2017-12-23 ENCOUNTER — Inpatient Hospital Stay: Payer: Medicare Other | Attending: Hematology and Oncology

## 2017-12-23 ENCOUNTER — Inpatient Hospital Stay: Payer: Medicare Other

## 2017-12-23 DIAGNOSIS — D631 Anemia in chronic kidney disease: Secondary | ICD-10-CM | POA: Diagnosis not present

## 2017-12-23 DIAGNOSIS — N183 Chronic kidney disease, stage 3 unspecified: Secondary | ICD-10-CM

## 2017-12-23 DIAGNOSIS — J9601 Acute respiratory failure with hypoxia: Secondary | ICD-10-CM

## 2017-12-23 DIAGNOSIS — D539 Nutritional anemia, unspecified: Secondary | ICD-10-CM

## 2017-12-23 DIAGNOSIS — D46C Myelodysplastic syndrome with isolated del(5q) chromosomal abnormality: Secondary | ICD-10-CM

## 2017-12-23 DIAGNOSIS — D693 Immune thrombocytopenic purpura: Secondary | ICD-10-CM

## 2017-12-23 DIAGNOSIS — D696 Thrombocytopenia, unspecified: Secondary | ICD-10-CM

## 2017-12-23 LAB — CBC WITH DIFFERENTIAL/PLATELET
Basophils Absolute: 0 10*3/uL (ref 0.0–0.1)
Basophils Relative: 0 %
EOS PCT: 1 %
Eosinophils Absolute: 0 10*3/uL (ref 0.0–0.5)
HEMATOCRIT: 35.6 % — AB (ref 38.4–49.9)
Hemoglobin: 10.6 g/dL — ABNORMAL LOW (ref 13.0–17.1)
LYMPHS PCT: 56 %
Lymphs Abs: 1.6 10*3/uL (ref 0.9–3.3)
MCH: 24 pg — ABNORMAL LOW (ref 27.2–33.4)
MCHC: 29.8 g/dL — AB (ref 32.0–36.0)
MCV: 80.5 fL (ref 79.3–98.0)
MONO ABS: 0.4 10*3/uL (ref 0.1–0.9)
MONOS PCT: 14 %
Neutro Abs: 0.9 10*3/uL — ABNORMAL LOW (ref 1.5–6.5)
Neutrophils Relative %: 29 %
PLATELETS: 166 10*3/uL (ref 140–400)
RBC: 4.42 MIL/uL (ref 4.20–5.82)
RDW: 27.3 % — AB (ref 11.0–14.6)
WBC: 2.9 10*3/uL — ABNORMAL LOW (ref 4.0–10.3)

## 2017-12-23 MED ORDER — ROMIPLOSTIM INJECTION 500 MCG
684.0000 ug | SUBCUTANEOUS | Status: DC
  Administered 2017-12-23: 684 ug via SUBCUTANEOUS
  Filled 2017-12-23: qty 0.37

## 2017-12-28 ENCOUNTER — Other Ambulatory Visit: Payer: Self-pay | Admitting: Family Medicine

## 2017-12-29 ENCOUNTER — Inpatient Hospital Stay: Payer: Medicare Other

## 2017-12-29 DIAGNOSIS — N183 Chronic kidney disease, stage 3 unspecified: Secondary | ICD-10-CM

## 2017-12-29 DIAGNOSIS — D539 Nutritional anemia, unspecified: Secondary | ICD-10-CM

## 2017-12-29 DIAGNOSIS — D631 Anemia in chronic kidney disease: Secondary | ICD-10-CM

## 2017-12-29 DIAGNOSIS — D696 Thrombocytopenia, unspecified: Secondary | ICD-10-CM

## 2017-12-29 DIAGNOSIS — J9601 Acute respiratory failure with hypoxia: Secondary | ICD-10-CM

## 2017-12-29 DIAGNOSIS — D693 Immune thrombocytopenic purpura: Secondary | ICD-10-CM

## 2017-12-29 DIAGNOSIS — D46C Myelodysplastic syndrome with isolated del(5q) chromosomal abnormality: Secondary | ICD-10-CM

## 2017-12-29 LAB — CBC WITH DIFFERENTIAL/PLATELET
Basophils Absolute: 0 10*3/uL (ref 0.0–0.1)
Basophils Relative: 0 %
EOS PCT: 0 %
Eosinophils Absolute: 0 10*3/uL (ref 0.0–0.5)
HCT: 35.9 % — ABNORMAL LOW (ref 38.4–49.9)
HEMOGLOBIN: 10.7 g/dL — AB (ref 13.0–17.1)
Lymphocytes Relative: 32 %
Lymphs Abs: 2 10*3/uL (ref 0.9–3.3)
MCH: 23.9 pg — ABNORMAL LOW (ref 27.2–33.4)
MCHC: 29.8 g/dL — AB (ref 32.0–36.0)
MCV: 80.1 fL (ref 79.3–98.0)
MONO ABS: 0.8 10*3/uL (ref 0.1–0.9)
Monocytes Relative: 12 %
NEUTROS ABS: 3.6 10*3/uL (ref 1.5–6.5)
Neutrophils Relative %: 56 %
PLATELETS: 124 10*3/uL — AB (ref 140–400)
RBC: 4.48 MIL/uL (ref 4.20–5.82)
RDW: 27.1 % — AB (ref 11.0–14.6)
WBC: 6.3 10*3/uL (ref 4.0–10.3)
nRBC: 1 /100 WBC — ABNORMAL HIGH

## 2017-12-29 MED ORDER — ROMIPLOSTIM INJECTION 500 MCG
684.0000 ug | SUBCUTANEOUS | Status: DC
  Administered 2017-12-29: 684 ug via SUBCUTANEOUS
  Filled 2017-12-29: qty 1

## 2017-12-29 MED ORDER — DARBEPOETIN ALFA 200 MCG/0.4ML IJ SOSY: 200.0000 ug | PREFILLED_SYRINGE | Freq: Once | INTRAMUSCULAR | Status: DC

## 2017-12-29 MED ORDER — DARBEPOETIN ALFA 200 MCG/0.4ML IJ SOSY
PREFILLED_SYRINGE | INTRAMUSCULAR | Status: AC
  Filled 2017-12-29: qty 0.4

## 2018-01-03 ENCOUNTER — Ambulatory Visit (INDEPENDENT_AMBULATORY_CARE_PROVIDER_SITE_OTHER): Payer: Medicare Other | Admitting: Family Medicine

## 2018-01-03 ENCOUNTER — Encounter: Payer: Self-pay | Admitting: Family Medicine

## 2018-01-03 VITALS — BP 126/72 | HR 80 | Temp 97.9°F | Resp 16 | Ht 69.0 in | Wt 190.0 lb

## 2018-01-03 DIAGNOSIS — M545 Low back pain, unspecified: Secondary | ICD-10-CM

## 2018-01-03 LAB — URINALYSIS, ROUTINE W REFLEX MICROSCOPIC
BACTERIA UA: NONE SEEN /HPF
Bilirubin Urine: NEGATIVE
GLUCOSE, UA: NEGATIVE
KETONES UR: NEGATIVE
LEUKOCYTES UA: NEGATIVE
Nitrite: NEGATIVE
PH: 5.5 (ref 5.0–8.0)
Specific Gravity, Urine: 1.02 (ref 1.001–1.03)
WBC UA: NONE SEEN /HPF (ref 0–5)

## 2018-01-03 LAB — MICROSCOPIC MESSAGE

## 2018-01-03 MED ORDER — TRAMADOL HCL 50 MG PO TABS: 50.0000 mg | ORAL_TABLET | Freq: Four times a day (QID) | ORAL | 0 refills | Status: DC | PRN

## 2018-01-03 NOTE — Progress Notes (Signed)
Subjective:    Patient ID: Ricardo Watkins, male    DOB: [birth year], 84 y.o.   MRN: [id number]  Patient reports a one-week history of right flank pain.  The pain is located approximately the level of L4.  It is located to the right side of the lumbar spine.  The pain does not radiate.  It does not move.  He describes it as a constant stabbing pain.  However when he gets up and moves around, the pain gradually gets better.  Sitting seems to make the pain worse.  He denies any dysuria or hematuria or urgency or frequency.  He denies any numbness or tingling radiating down his leg.  He denies any weakness in his leg.  He denies any fevers or chills.  He has no CVA tenderness today on exam.  There is no spinous process tenderness to palpation of the lumbar spine.  There is no pain to palpation of his right ribs.  There is no pain or tenderness to palpation of the iliac crest.  I am unable to reproduce the pain today on exam. Past Medical History:  Diagnosis Date  . Adenomatous colon polyp   . CAD (coronary artery disease)    30% LAD Stenosis, 70% ramus intermedius stenosis, treated with PTCA and angioplasty by Dr Albertine Patricia 2004  . Cataract    right eye  . Cough, persistent 11/04/2015  . Deficiency anemia 04/19/2014  . Diverticulosis   . DM (diabetes mellitus) ([place])   . DVT (deep venous thrombosis) ([place])    secondary to surgery  . Dyslipidemia   . Hyperlipidemia   . Hypertension   . Inguinal hernia    right  . 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 Microcytic anemia 07/07/2015  . 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 Non Hodgkin's lymphoma ([place])   . Pleural effusion, left 11/04/2015  . 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 PVD (peripheral vascular disease) ([place])    rt renal artery stent  . Renal insufficiency   . Thrombocytopenia ([place])   . Thrombotic stroke ([place]) 09/10/2011   January 18, 2011 infarct genu & post limb R internal capsule - acute; previous lacunar infarcts/extensive white matter dis  . Thyroid nodule    left lower lobe (annual monitoring).  . Vitamin D deficiency    Past Surgical History:  Procedure Laterality Date  . APPENDECTOMY     patient ?  Marland Kitchen CARDIAC CATHETERIZATION    . CHEST TUBE INSERTION Left 02/10/2016   Procedure: INSERTION PLEURAL DRAINAGE CATHETER;  Surgeon: [name], MD;  Location: [place];  Service: Thoracic;  Laterality: Left;  . CHEST TUBE INSERTION Left 04/08/2016   Procedure: CHEST TUBE INSERTION;  Surgeon: [name], MD;  Location: [place];  Service: Thoracic;  Laterality: Left;  . COLONOSCOPY    . CORONARY ANGIOPLASTY    . CYSTOURETHROSCOPY     ROBOTIC ARM NUCLETRON SEED IMPLANTATION OF PROSTATE  . EXPLORATORY LAPAROTOMY     For evaluation of lymphoma  . REMOVAL OF PLEURAL DRAINAGE CATHETER Left 04/08/2016   Procedure: REMOVAL OF PLEURAL DRAINAGE CATHETER;  Surgeon: [name], MD;  Location: [facility] OR;  Service: Thoracic;  Laterality: Left;   Current Outpatient Medications on File Prior to Visit  Medication Sig Dispense Refill  . acetaminophen (TYLENOL) 500 MG tablet Take 1,000 mg  by mouth every 6 (six) hours as needed for mild pain, moderate pain or headache.     . calamine lotion Apply 1 application topically as needed for itching.    . clopidogrel (PLAVIX) 75 MG tablet TAKE 1 TABLET BY MOUTH ONCE A DAY WITH BREAKFAST 90 tablet 3  . cyanocobalamin (,VITAMIN B-12,) 1000 MCG/ML injection Inject 1 mL (1,000 mcg total) into the muscle every 30 (thirty) days. 10 mL 3  . furosemide (LASIX) 40 MG tablet Take 1 tablet (40 mg total) by mouth daily. 90 tablet 3  . gabapentin (NEURONTIN) 100 MG capsule Take 1 capsule (100 mg total) by mouth at bedtime. 30 capsule 3  . hydrocortisone cream 1 % Apply 1 application topically at bedtime as needed for itching.    . Insulin Glargine (LANTUS SOLOSTAR) 100 UNIT/ML Solostar Pen INJECT 25  UNITS EVERY NIGHT AT BEDTIME (Patient taking differently: Inject 30 Units into the skin every morning. ) 5 pen 3  . loperamide (IMODIUM) 2 MG capsule Take 2 mg by mouth as needed for diarrhea or loose stools.    . meclizine (ANTIVERT) 12.5 MG tablet Take 1 tablet (12.5 mg total) by mouth 3 (three) times daily as needed for dizziness. 30 tablet 0  . metoprolol succinate (TOPROL-XL) 100 MG 24 hr tablet TAKE 1 TABLET BY MOUTH ONCE DAILY 90 tablet 3  . Multiple Vitamin (MULTIVITAMIN WITH MINERALS) TABS tablet Take 1 tablet by mouth at bedtime.    . NON FORMULARY Inject 1 Dose into the vein once a week. Platelet booster    . OVER THE COUNTER MEDICATION Place 1 drop into both eyes daily as needed (dry eyes). Over the counter lubricating eye drop    . tamsulosin (FLOMAX) 0.4 MG CAPS capsule TAKE 1 CAPSULE BY MOUTH ONCE DAILY 90 capsule 3  . traMADol (ULTRAM) 50 MG tablet Take 1 tablet (50 mg total) by mouth every 6 (six) hours as needed. (Patient taking differently: Take 50 mg by mouth every 6 (six) hours as needed for moderate pain. ) 120 tablet 2  . potassium chloride SA (K-DUR,KLOR-CON) 20 MEQ tablet Take 1 tablet (20 mEq total) by mouth daily for 10 days. While taking lasix 10 tablet 0   Current Facility-Administered Medications on File Prior to Visit  Medication Dose Route Frequency Provider Last Rate Last Dose  . cyanocobalamin ((VITAMIN B-12)) injection 1,000 mcg  1,000 mcg Subcutaneous Q30 days [name], MD   1,000 mcg at 12/12/17 0901  . romiPLOStim (NPLATE) injection 170 mcg  170 mcg Subcutaneous Weekly [name], Ni, MD   170 mcg at 11/25/16 1345   Allergies  Allergen Reactions  . Glucophage [Metformin Hydrochloride] Other (See Comments)    Chest pain  . Zetia [Ezetimibe] Other (See Comments)    weakness  . Fenofibrate Rash  . Niacin-Lovastatin Er Rash   Social History   Socioeconomic History  . Marital status: Married    Spouse name: Not on file  . Number of children: 4  .  Years of education: Not on file  . Highest education level: Not on file  Occupational History  . Occupation: retired  Scientific laboratory technician  . Financial resource strain: Not on file  . Food insecurity:    Worry: Not on file    Inability: Not on file  . Transportation needs:    Medical: Not on file    Non-medical: Not on file  Tobacco Use  . Smoking status: Former Smoker    Packs/day: 0.50  Years: 20.00    Pack years: 10.00    Types: Pipe, Cigarettes    Last attempt to quit: 06/21/1958    Years since quitting: 59.5  . Smokeless tobacco: Never Used  Substance and Sexual Activity  . Alcohol use: No    Alcohol/week: 0.0 oz  . Drug use: No  . Sexual activity: Yes    Partners: Female  Lifestyle  . Physical activity:    Days per week: Not on file    Minutes per session: Not on file  . Stress: Not on file  Relationships  . Social connections:    Talks on phone: Not on file    Gets together: Not on file    Attends religious service: Not on file    Active member of club or organization: Not on file    Attends meetings of clubs or organizations: Not on file    Relationship status: Not on file  . Intimate partner violence:    Fear of current or ex partner: Not on file    Emotionally abused: Not on file    Physically abused: Not on file    Forced sexual activity: Not on file  Other Topics Concern  . Not on file  Social History Narrative  . Not on file      Review of Systems  Gastrointestinal: Positive for diarrhea.  Musculoskeletal: Positive for back pain.  All other systems reviewed and are negative.      Objective:   Physical Exam  Constitutional: He appears well-developed.  Non-toxic appearance. He has a sickly appearance. He does not appear ill. No distress.  Neck: Neck supple. No JVD present.  Cardiovascular: Normal rate and normal heart sounds.  Pulmonary/Chest: Effort normal and breath sounds normal. No respiratory distress. He has no wheezes. He has no rales.    Abdominal: Soft. Bowel sounds are normal. He exhibits no distension. There is no tenderness. There is no rebound.  Musculoskeletal: He exhibits edema.       Lumbar back: He exhibits tenderness. He exhibits normal range of motion, no bony tenderness, no swelling, no edema, no deformity, no laceration, no pain and no spasm.       Back:  Lymphadenopathy:    He has no cervical adenopathy.  Skin: He is not diaphoretic.  Vitals reviewed.         Assessment & Plan:  Acute right-sided low back pain without sciatica - Plan: Urinalysis, Routine w reflex microscopic Urinalysis shows trace blood.  Otherwise its normal.  No evidence of urinary tract infection.  I believe this is most likely a muscle strain based on his history and his exam.  There is no tenderness to palpation over the bones.  He has suffered no injury.  He denies any lumbar radiculopathy or leg weakness.  Therefore I will treat the patient symptomatically.  He can use tramadol 50 mg every 6 hours as needed for pain.  He does rate the pain as a 5 on a scale of 1-10.  I have recommended range of motion stretching to keep the muscle limber.  I recommended a back brace for support.  I recommended using heat.  Reassess in 1 week if no better or sooner if worse

## 2018-01-05 ENCOUNTER — Inpatient Hospital Stay: Payer: Medicare Other

## 2018-01-05 VITALS — BP 149/79 | HR 81 | Temp 97.9°F | Resp 17

## 2018-01-05 DIAGNOSIS — D696 Thrombocytopenia, unspecified: Secondary | ICD-10-CM

## 2018-01-05 DIAGNOSIS — D46C Myelodysplastic syndrome with isolated del(5q) chromosomal abnormality: Secondary | ICD-10-CM

## 2018-01-05 DIAGNOSIS — D539 Nutritional anemia, unspecified: Secondary | ICD-10-CM

## 2018-01-05 DIAGNOSIS — D693 Immune thrombocytopenic purpura: Secondary | ICD-10-CM | POA: Diagnosis not present

## 2018-01-05 DIAGNOSIS — N183 Chronic kidney disease, stage 3 unspecified: Secondary | ICD-10-CM

## 2018-01-05 DIAGNOSIS — D631 Anemia in chronic kidney disease: Secondary | ICD-10-CM

## 2018-01-05 DIAGNOSIS — J9601 Acute respiratory failure with hypoxia: Secondary | ICD-10-CM

## 2018-01-05 LAB — CBC WITH DIFFERENTIAL/PLATELET
BASOS ABS: 0 10*3/uL (ref 0.0–0.1)
Basophils Relative: 0 %
EOS PCT: 0 %
Eosinophils Absolute: 0 10*3/uL (ref 0.0–0.5)
HCT: 34.4 % — ABNORMAL LOW (ref 38.4–49.9)
HEMOGLOBIN: 10.2 g/dL — AB (ref 13.0–17.1)
LYMPHS PCT: 38 %
Lymphs Abs: 1.9 10*3/uL (ref 0.9–3.3)
MCH: 23.7 pg — ABNORMAL LOW (ref 27.2–33.4)
MCHC: 29.7 g/dL — ABNORMAL LOW (ref 32.0–36.0)
MCV: 79.8 fL (ref 79.3–98.0)
Monocytes Absolute: 1.2 10*3/uL — ABNORMAL HIGH (ref 0.1–0.9)
Monocytes Relative: 24 %
NEUTROS ABS: 2 10*3/uL (ref 1.5–6.5)
Neutrophils Relative %: 38 %
Platelets: 180 10*3/uL (ref 140–400)
RBC: 4.31 MIL/uL (ref 4.20–5.82)
RDW: 27.5 % — ABNORMAL HIGH (ref 11.0–14.6)
WBC: 5.1 10*3/uL (ref 4.0–10.3)

## 2018-01-05 MED ORDER — ROMIPLOSTIM INJECTION 500 MCG
684.0000 ug | SUBCUTANEOUS | Status: DC
  Administered 2018-01-05: 684 ug via SUBCUTANEOUS
  Filled 2018-01-05: qty 1.37

## 2018-01-09 ENCOUNTER — Encounter: Payer: Self-pay | Admitting: Family Medicine

## 2018-01-09 ENCOUNTER — Ambulatory Visit (INDEPENDENT_AMBULATORY_CARE_PROVIDER_SITE_OTHER): Payer: Medicare Other | Admitting: Family Medicine

## 2018-01-09 ENCOUNTER — Ambulatory Visit
Admission: RE | Admit: 2018-01-09 | Discharge: 2018-01-09 | Disposition: A | Discharge status: Home / Self Care | Payer: Medicare Other | Source: Ambulatory Visit | Attending: Family Medicine | Admitting: Family Medicine

## 2018-01-09 VITALS — BP 142/78 | HR 86 | Temp 97.6°F | Resp 18 | Ht 69.0 in | Wt 188.0 lb

## 2018-01-09 DIAGNOSIS — R05 Cough: Secondary | ICD-10-CM | POA: Diagnosis not present

## 2018-01-09 DIAGNOSIS — E538 Deficiency of other specified B group vitamins: Secondary | ICD-10-CM | POA: Diagnosis not present

## 2018-01-09 DIAGNOSIS — R0602 Shortness of breath: Secondary | ICD-10-CM

## 2018-01-09 LAB — BASIC METABOLIC PANEL WITH GFR
BUN / CREAT RATIO: 14 (calc) (ref 6–22)
BUN: 25 mg/dL (ref 7–25)
CO2: 26 mmol/L (ref 20–32)
CREATININE: 1.74 mg/dL — AB (ref 0.70–1.11)
Calcium: 9.1 mg/dL (ref 8.6–10.3)
Chloride: 104 mmol/L (ref 98–110)
GFR, EST NON AFRICAN AMERICAN: 35 mL/min/{1.73_m2} — AB (ref >=60)
GFR, Est African American: 41 mL/min/{1.73_m2} — ABNORMAL LOW (ref >=60)
Glucose, Bld: 151 mg/dL — ABNORMAL HIGH (ref 65–99)
Potassium: 5.4 mmol/L — ABNORMAL HIGH (ref 3.5–5.3)
Sodium: 139 mmol/L (ref 135–146)

## 2018-01-09 NOTE — Progress Notes (Signed)
Subjective:    Patient ID: Ricardo Watkins, male    DOB: 02/07/1931, 84 y.o.   MRN: [id number]  Patient has been coughing ever since Friday.  He also reports some shortness of breath when lying flat.  Cough is worse at night.  Wife reports chest congestion that is audible.  Last time he had the symptoms, he had pulmonary edema.  Therefore she increased his Lasix from 20 mg a day to 40 mg a day this morning.  His weight is down 2 pounds from last week.  He denies any chest pain, hemoptysis, purulent sputum, fever.  Pulmonary exam today is unremarkable aside from some mild rhonchi in left lower lobe that are chronic related to his previous pleural effusions and scarring.  There is some trace pitting edema in both ankles. Past Medical History:  Diagnosis Date  . Adenomatous colon polyp   . CAD (coronary artery disease)    30% LAD Stenosis, 70% ramus intermedius stenosis, treated with PTCA and angioplasty by Dr Albertine Patricia 2004  . Cataract    right eye  . Cough, persistent 11/04/2015  . Deficiency anemia 04/19/2014  . Diverticulosis   . DM (diabetes mellitus) ([place])   . DVT (deep venous thrombosis) ([place])    secondary to surgery  . Dyslipidemia   . Hyperlipidemia   . Hypertension   . Inguinal hernia    right  . 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 Microcytic anemia 07/07/2015  . 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 Non Hodgkin's lymphoma ([place])   . Pleural effusion, left 11/04/2015  . 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 PVD (peripheral vascular disease) ([place])    rt renal artery stent  . Renal insufficiency   . Thrombocytopenia ([place])   . Thrombotic stroke ([place]) 09/10/2011   January 18, 2011 infarct genu & post limb R internal capsule - acute; previous lacunar infarcts/extensive white matter dis  . Thyroid nodule    left lower lobe (annual monitoring).  . Vitamin D deficiency    Past  Surgical History:  Procedure Laterality Date  . APPENDECTOMY     patient ?  Marland Kitchen CARDIAC CATHETERIZATION    . CHEST TUBE INSERTION Left 02/10/2016   Procedure: INSERTION PLEURAL DRAINAGE CATHETER;  Surgeon: [name], MD;  Location: [place];  Service: Thoracic;  Laterality: Left;  . CHEST TUBE INSERTION Left 04/08/2016   Procedure: CHEST TUBE INSERTION;  Surgeon: [name], MD;  Location: [place];  Service: Thoracic;  Laterality: Left;  . COLONOSCOPY    . CORONARY ANGIOPLASTY    . CYSTOURETHROSCOPY     ROBOTIC ARM NUCLETRON SEED IMPLANTATION OF PROSTATE  . EXPLORATORY LAPAROTOMY     For evaluation of lymphoma  . REMOVAL OF PLEURAL DRAINAGE CATHETER Left 04/08/2016   Procedure: REMOVAL OF PLEURAL DRAINAGE CATHETER;  Surgeon: [name], MD;  Location: [facility] OR;  Service: Thoracic;  Laterality: Left;   Current Outpatient Medications on File Prior to Visit  Medication Sig Dispense Refill  . acetaminophen (TYLENOL) 500 MG tablet Take 1,000 mg by mouth every 6 (six) hours as needed for mild pain, moderate pain or headache.     . calamine lotion Apply 1 application topically as needed for itching.    . clopidogrel (PLAVIX) 75 MG tablet TAKE 1 TABLET BY MOUTH ONCE A DAY WITH BREAKFAST 90  tablet 3  . cyanocobalamin (,VITAMIN B-12,) 1000 MCG/ML injection Inject 1 mL (1,000 mcg total) into the muscle every 30 (thirty) days. 10 mL 3  . furosemide (LASIX) 40 MG tablet Take 1 tablet (40 mg total) by mouth daily. 90 tablet 3  . gabapentin (NEURONTIN) 100 MG capsule Take 1 capsule (100 mg total) by mouth at bedtime. 30 capsule 3  . hydrocortisone cream 1 % Apply 1 application topically at bedtime as needed for itching.    . Insulin Glargine (LANTUS SOLOSTAR) 100 UNIT/ML Solostar Pen INJECT 25 UNITS EVERY NIGHT AT BEDTIME (Patient taking differently: Inject 30 Units into the skin every morning. ) 5 pen 3  . loperamide (IMODIUM) 2 MG capsule Take 2 mg by mouth as needed for diarrhea or loose stools.     . meclizine (ANTIVERT) 12.5 MG tablet Take 1 tablet (12.5 mg total) by mouth 3 (three) times daily as needed for dizziness. 30 tablet 0  . metoprolol succinate (TOPROL-XL) 100 MG 24 hr tablet TAKE 1 TABLET BY MOUTH ONCE DAILY 90 tablet 3  . Multiple Vitamin (MULTIVITAMIN WITH MINERALS) TABS tablet Take 1 tablet by mouth at bedtime.    . NON FORMULARY Inject 1 Dose into the vein once a week. Platelet booster    . OVER THE COUNTER MEDICATION Place 1 drop into both eyes daily as needed (dry eyes). Over the counter lubricating eye drop    . tamsulosin (FLOMAX) 0.4 MG CAPS capsule TAKE 1 CAPSULE BY MOUTH ONCE DAILY 90 capsule 3  . traMADol (ULTRAM) 50 MG tablet Take 1 tablet (50 mg total) by mouth every 6 (six) hours as needed for moderate pain. 60 tablet 0  . potassium chloride SA (K-DUR,KLOR-CON) 20 MEQ tablet Take 1 tablet (20 mEq total) by mouth daily for 10 days. While taking lasix 10 tablet 0   Current Facility-Administered Medications on File Prior to Visit  Medication Dose Route Frequency Provider Last Rate Last Dose  . cyanocobalamin ((VITAMIN B-12)) injection 1,000 mcg  1,000 mcg Subcutaneous Q30 days [name], MD   1,000 mcg at 01/09/18 1303  . romiPLOStim (NPLATE) injection 170 mcg  170 mcg Subcutaneous Weekly [name], Ni, MD   170 mcg at 11/25/16 1345   Allergies  Allergen Reactions  . Glucophage [Metformin Hydrochloride] Other (See Comments)    Chest pain  . Zetia [Ezetimibe] Other (See Comments)    weakness  . Fenofibrate Rash  . Niacin-Lovastatin Er Rash   Social History   Socioeconomic History  . Marital status: Married    Spouse name: Not on file  . Number of children: 4  . Years of education: Not on file  . Highest education level: Not on file  Occupational History  . Occupation: retired  Scientific laboratory technician  . Financial resource strain: Not on file  . Food insecurity:    Worry: Not on file    Inability: Not on file  . Transportation needs:    Medical: Not  on file    Non-medical: Not on file  Tobacco Use  . Smoking status: Former Smoker    Packs/day: 0.50    Years: 20.00    Pack years: 10.00    Types: Pipe, Cigarettes    Last attempt to quit: 06/21/1958    Years since quitting: 59.5  . Smokeless tobacco: Never Used  Substance and Sexual Activity  . Alcohol use: No    Alcohol/week: 0.0 oz  . Drug use: No  . Sexual activity: Yes  Partners: Female  Lifestyle  . Physical activity:    Days per week: Not on file    Minutes per session: Not on file  . Stress: Not on file  Relationships  . Social connections:    Talks on phone: Not on file    Gets together: Not on file    Attends religious service: Not on file    Active member of club or organization: Not on file    Attends meetings of clubs or organizations: Not on file    Relationship status: Not on file  . Intimate partner violence:    Fear of current or ex partner: Not on file    Emotionally abused: Not on file    Physically abused: Not on file    Forced sexual activity: Not on file  Other Topics Concern  . Not on file  Social History Narrative  . Not on file      Review of Systems  All other systems reviewed and are negative.      Objective:   Physical Exam  Constitutional: He appears well-developed.  Non-toxic appearance. He has a sickly appearance. He does not appear ill. No distress.  Neck: Neck supple. No JVD present.  Cardiovascular: Normal rate and normal heart sounds.  Pulmonary/Chest: Effort normal and breath sounds normal. No respiratory distress. He has no wheezes. He has no rales.  Abdominal: Soft. Bowel sounds are normal. He exhibits no distension. There is no tenderness. There is no rebound.  Musculoskeletal: He exhibits edema.  Lymphadenopathy:    He has no cervical adenopathy.  Skin: He is not diaphoretic.  Vitals reviewed.         Assessment & Plan:  SOB (shortness of breath) - Plan: Brain natriuretic peptide, BASIC METABOLIC PANEL WITH  GFR, DG Chest 2 View  Vitamin B12 deficiency Proceed with a chest x-ray to evaluate for evidence of pulmonary edema or vascular congestion.  I am unable to appreciate any abnormalities on his exam.  Rule out pneumonia on the chest x-ray.  Check BMP and BNP.

## 2018-01-10 LAB — BRAIN NATRIURETIC PEPTIDE: BRAIN NATRIURETIC PEPTIDE: 642 pg/mL — AB (ref <100)

## 2018-01-11 ENCOUNTER — Ambulatory Visit (INDEPENDENT_AMBULATORY_CARE_PROVIDER_SITE_OTHER): Payer: Medicare Other | Admitting: Podiatry

## 2018-01-11 ENCOUNTER — Encounter: Payer: Self-pay | Admitting: Podiatry

## 2018-01-11 DIAGNOSIS — M79674 Pain in right toe(s): Secondary | ICD-10-CM | POA: Diagnosis not present

## 2018-01-11 DIAGNOSIS — M79675 Pain in left toe(s): Secondary | ICD-10-CM

## 2018-01-11 DIAGNOSIS — B351 Tinea unguium: Secondary | ICD-10-CM

## 2018-01-11 DIAGNOSIS — D689 Coagulation defect, unspecified: Secondary | ICD-10-CM

## 2018-01-12 ENCOUNTER — Inpatient Hospital Stay: Payer: Medicare Other

## 2018-01-12 VITALS — BP 142/78

## 2018-01-12 DIAGNOSIS — D631 Anemia in chronic kidney disease: Secondary | ICD-10-CM | POA: Diagnosis not present

## 2018-01-12 DIAGNOSIS — N183 Chronic kidney disease, stage 3 unspecified: Secondary | ICD-10-CM

## 2018-01-12 DIAGNOSIS — D693 Immune thrombocytopenic purpura: Secondary | ICD-10-CM

## 2018-01-12 DIAGNOSIS — D696 Thrombocytopenia, unspecified: Secondary | ICD-10-CM

## 2018-01-12 DIAGNOSIS — D539 Nutritional anemia, unspecified: Secondary | ICD-10-CM

## 2018-01-12 DIAGNOSIS — J9601 Acute respiratory failure with hypoxia: Secondary | ICD-10-CM

## 2018-01-12 DIAGNOSIS — D46C Myelodysplastic syndrome with isolated del(5q) chromosomal abnormality: Secondary | ICD-10-CM

## 2018-01-12 LAB — CBC WITH DIFFERENTIAL/PLATELET
Basophils Absolute: 0.1 10*3/uL (ref 0.0–0.1)
Basophils Relative: 2 %
EOS ABS: 0 10*3/uL (ref 0.0–0.5)
Eosinophils Relative: 0 %
HCT: 31.3 % — ABNORMAL LOW (ref 38.4–49.9)
HEMOGLOBIN: 9.7 g/dL — AB (ref 13.0–17.1)
Lymphocytes Relative: 46 %
Lymphs Abs: 2.1 10*3/uL (ref 0.9–3.3)
MCH: 23.8 pg — ABNORMAL LOW (ref 27.2–33.4)
MCHC: 31.1 g/dL — ABNORMAL LOW (ref 32.0–36.0)
MCV: 76.6 fL — ABNORMAL LOW (ref 79.3–98.0)
Monocytes Absolute: 0.6 10*3/uL (ref 0.1–0.9)
Monocytes Relative: 12 %
NEUTROS ABS: 1.8 10*3/uL (ref 1.5–6.5)
Neutrophils Relative %: 40 %
Platelets: 121 10*3/uL — ABNORMAL LOW (ref 140–400)
RBC: 4.09 MIL/uL — ABNORMAL LOW (ref 4.20–5.82)
RDW: 28.9 % — ABNORMAL HIGH (ref 11.0–14.6)
WBC: 4.5 10*3/uL (ref 4.0–10.3)

## 2018-01-12 MED ORDER — DARBEPOETIN ALFA 200 MCG/0.4ML IJ SOSY
PREFILLED_SYRINGE | INTRAMUSCULAR | Status: AC
  Filled 2018-01-12: qty 0.4

## 2018-01-12 MED ORDER — ROMIPLOSTIM INJECTION 500 MCG
684.0000 ug | SUBCUTANEOUS | Status: DC
  Administered 2018-01-12: 684 ug via SUBCUTANEOUS
  Filled 2018-01-12: qty 1

## 2018-01-12 MED ORDER — DARBEPOETIN ALFA 200 MCG/0.4ML IJ SOSY
200.0000 ug | PREFILLED_SYRINGE | Freq: Once | INTRAMUSCULAR | Status: AC
  Administered 2018-01-12: 200 ug via SUBCUTANEOUS

## 2018-01-12 NOTE — Progress Notes (Signed)
Subjective:   Patient ID: Ricardo Watkins, male   DOB: 84 y.o.   MRN: [id number]   HPI Patient presents with chronic elongated nailbeds 1-5 both feet that are thick and yellow and he cannot cut and they do get painful when he tries to wear shoe gear   ROS      Objective:  Physical Exam  No change neurovascular status with thick yellow brittle nailbeds 1-5 both feet that are incurvated in the corners and tender when palpated     Assessment:  Chronic mycotic nail infection 1-5 both feet with pain     Plan:  Debride painful nailbeds 1-5 both feet with no iatrogenic bleeding noted

## 2018-01-16 ENCOUNTER — Encounter: Payer: Self-pay | Admitting: Family Medicine

## 2018-01-16 ENCOUNTER — Ambulatory Visit (INDEPENDENT_AMBULATORY_CARE_PROVIDER_SITE_OTHER): Payer: Medicare Other | Admitting: Family Medicine

## 2018-01-16 VITALS — BP 136/70 | HR 77 | Temp 97.6°F | Resp 18 | Ht 69.0 in | Wt 187.0 lb

## 2018-01-16 DIAGNOSIS — R05 Cough: Secondary | ICD-10-CM | POA: Diagnosis not present

## 2018-01-16 DIAGNOSIS — J019 Acute sinusitis, unspecified: Secondary | ICD-10-CM | POA: Diagnosis not present

## 2018-01-16 DIAGNOSIS — B9689 Other specified bacterial agents as the cause of diseases classified elsewhere: Secondary | ICD-10-CM

## 2018-01-16 DIAGNOSIS — R059 Cough, unspecified: Secondary | ICD-10-CM

## 2018-01-16 MED ORDER — LEVOCETIRIZINE DIHYDROCHLORIDE 5 MG PO TABS: 5.0000 mg | ORAL_TABLET | Freq: Every evening | ORAL | 0 refills | Status: DC

## 2018-01-16 MED ORDER — AMOXICILLIN-POT CLAVULANATE 875-125 MG PO TABS
1.0000 | ORAL_TABLET | Freq: Two times a day (BID) | ORAL | 0 refills | Status: DC
Start: 2018-01-16 — End: 2018-01-27

## 2018-01-16 NOTE — Progress Notes (Signed)
Subjective:    Patient ID: Ricardo Watkins, male    DOB: [birth year], 84 y.o.   MRN: [id number] 01/09/18 Patient has been coughing ever since Friday.  He also reports some shortness of breath when lying flat.  Cough is worse at night.  Wife reports chest congestion that is audible.  Last time he had the symptoms, he had pulmonary edema.  Therefore she increased his Lasix from 20 mg a day to 40 mg a day this morning.  His weight is down 2 pounds from last week.  He denies any chest pain, hemoptysis, purulent sputum, fever.  Pulmonary exam today is unremarkable aside from some mild rhonchi in left lower lobe that are chronic related to his previous pleural effusions and scarring.  There is some trace pitting edema in both ankles.  At that time, my plan was: Proceed with a chest x-ray to evaluate for evidence of pulmonary edema or vascular congestion.  I am unable to appreciate any abnormalities on his exam.  Rule out pneumonia on the chest x-ray.  Check BMP and BNP.  01/16/18 Chest x-ray revealed no pneumonia or pulmonary edema.  BNP was elevated at greater than 600 however increased diuretic for 2 to 3 days had no benefit.  He presents today with continued cough.  It is worse in the morning when he first wakes up.  He now has significant head congestion, constant rhinorrhea.  Both nasal passages are occluded with mucus and mucosal edema.  He does report a headache and postnasal drip causing a constant tickle cough.  He is taking no decongestant.  On exam, his lungs continue to remain clear to auscultation bilaterally with no wheezes crackles or rails.  There is only trace edema today on exam. Wt Readings from Last 3 Encounters:  01/16/18 187 lb (84.8 kg)  01/09/18 188 lb (85.3 kg)  01/03/18 190 lb (86.2 kg)   His weight has remained stable and is actually less than it was at his last visit.  There is no evidence of fluid overload today on exam Past Medical History:  Diagnosis Date  . Adenomatous colon  polyp   . CAD (coronary artery disease)    30% LAD Stenosis, 70% ramus intermedius stenosis, treated with PTCA and angioplasty by Dr Albertine Patricia 2004  . Cataract    right eye  . Cough, persistent 11/04/2015  . Deficiency anemia 04/19/2014  . Diverticulosis   . DM (diabetes mellitus) ([place])   . DVT (deep venous thrombosis) ([place])    secondary to surgery  . Dyslipidemia   . Hyperlipidemia   . Hypertension   . Inguinal hernia    right  . 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 Microcytic anemia 07/07/2015  . 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 Non Hodgkin's lymphoma ([place])   . Pleural effusion, left 11/04/2015  . 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 PVD (peripheral vascular disease) ([place])    rt renal artery stent  . Renal insufficiency   . Thrombocytopenia ([place])   . Thrombotic stroke ([place]) 09/10/2011   January 18, 2011 infarct genu & post limb R internal capsule - acute; previous lacunar infarcts/extensive white matter dis  . Thyroid nodule    left lower lobe (annual monitoring).  . Vitamin D deficiency    Past Surgical History:  Procedure Laterality Date  . APPENDECTOMY  patient ?  Marland Kitchen CARDIAC CATHETERIZATION    . CHEST TUBE INSERTION Left 02/10/2016   Procedure: INSERTION PLEURAL DRAINAGE CATHETER;  Surgeon: [name], MD;  Location: [place];  Service: Thoracic;  Laterality: Left;  . CHEST TUBE INSERTION Left 04/08/2016   Procedure: CHEST TUBE INSERTION;  Surgeon: [name], MD;  Location: [place];  Service: Thoracic;  Laterality: Left;  . COLONOSCOPY    . CORONARY ANGIOPLASTY    . CYSTOURETHROSCOPY     ROBOTIC ARM NUCLETRON SEED IMPLANTATION OF PROSTATE  . EXPLORATORY LAPAROTOMY     For evaluation of lymphoma  . REMOVAL OF PLEURAL DRAINAGE CATHETER Left 04/08/2016   Procedure: REMOVAL OF PLEURAL DRAINAGE CATHETER;  Surgeon: [name], MD;  Location[facility] OR;   Service: Thoracic;  Laterality: Left;   Current Outpatient Medications on File Prior to Visit  Medication Sig Dispense Refill  . acetaminophen (TYLENOL) 500 MG tablet Take 1,000 mg by mouth every 6 (six) hours as needed for mild pain, moderate pain or headache.     . calamine lotion Apply 1 application topically as needed for itching.    . clopidogrel (PLAVIX) 75 MG tablet TAKE 1 TABLET BY MOUTH ONCE A DAY WITH BREAKFAST 90 tablet 3  . cyanocobalamin (,VITAMIN B-12,) 1000 MCG/ML injection Inject 1 mL (1,000 mcg total) into the muscle every 30 (thirty) days. 10 mL 3  . furosemide (LASIX) 40 MG tablet Take 1 tablet (40 mg total) by mouth daily. 90 tablet 3  . gabapentin (NEURONTIN) 100 MG capsule Take 1 capsule (100 mg total) by mouth at bedtime. 30 capsule 3  . hydrocortisone cream 1 % Apply 1 application topically at bedtime as needed for itching.    . Insulin Glargine (LANTUS SOLOSTAR) 100 UNIT/ML Solostar Pen INJECT 25 UNITS EVERY NIGHT AT BEDTIME (Patient taking differently: Inject 30 Units into the skin every morning. ) 5 pen 3  . loperamide (IMODIUM) 2 MG capsule Take 2 mg by mouth as needed for diarrhea or loose stools.    . meclizine (ANTIVERT) 12.5 MG tablet Take 1 tablet (12.5 mg total) by mouth 3 (three) times daily as needed for dizziness. 30 tablet 0  . metoprolol succinate (TOPROL-XL) 100 MG 24 hr tablet TAKE 1 TABLET BY MOUTH ONCE DAILY 90 tablet 3  . Multiple Vitamin (MULTIVITAMIN WITH MINERALS) TABS tablet Take 1 tablet by mouth at bedtime.    . NON FORMULARY Inject 1 Dose into the vein once a week. Platelet booster    . OVER THE COUNTER MEDICATION Place 1 drop into both eyes daily as needed (dry eyes). Over the counter lubricating eye drop    . tamsulosin (FLOMAX) 0.4 MG CAPS capsule TAKE 1 CAPSULE BY MOUTH ONCE DAILY 90 capsule 3  . traMADol (ULTRAM) 50 MG tablet Take 1 tablet (50 mg total) by mouth every 6 (six) hours as needed for moderate pain. 60 tablet 0  . potassium  chloride SA (K-DUR,KLOR-CON) 20 MEQ tablet Take 1 tablet (20 mEq total) by mouth daily for 10 days. While taking lasix 10 tablet 0   Current Facility-Administered Medications on File Prior to Visit  Medication Dose Route Frequency Provider Last Rate Last Dose  . cyanocobalamin ((VITAMIN B-12)) injection 1,000 mcg  1,000 mcg Subcutaneous Q30 days [name], MD   1,000 mcg at 01/09/18 1303  . romiPLOStim (NPLATE) injection 170 mcg  170 mcg Subcutaneous Weekly [name], Ni, MD   170 mcg at 11/25/16 1345   Allergies  Allergen  Reactions  . Glucophage [Metformin Hydrochloride] Other (See Comments)    Chest pain  . Zetia [Ezetimibe] Other (See Comments)    weakness  . Fenofibrate Rash  . Niacin-Lovastatin Er Rash   Social History   Socioeconomic History  . Marital status: Married    Spouse name: Not on file  . Number of children: 4  . Years of education: Not on file  . Highest education level: Not on file  Occupational History  . Occupation: retired  Scientific laboratory technician  . Financial resource strain: Not on file  . Food insecurity:    Worry: Not on file    Inability: Not on file  . Transportation needs:    Medical: Not on file    Non-medical: Not on file  Tobacco Use  . Smoking status: Former Smoker    Packs/day: 0.50    Years: 20.00    Pack years: 10.00    Types: Pipe, Cigarettes    Last attempt to quit: 06/21/1958    Years since quitting: 59.6  . Smokeless tobacco: Never Used  Substance and Sexual Activity  . Alcohol use: No    Alcohol/week: 0.0 oz  . Drug use: No  . Sexual activity: Yes    Partners: Female  Lifestyle  . Physical activity:    Days per week: Not on file    Minutes per session: Not on file  . Stress: Not on file  Relationships  . Social connections:    Talks on phone: Not on file    Gets together: Not on file    Attends religious service: Not on file    Active member of club or organization: Not on file    Attends meetings of clubs or organizations:  Not on file    Relationship status: Not on file  . Intimate partner violence:    Fear of current or ex partner: Not on file    Emotionally abused: Not on file    Physically abused: Not on file    Forced sexual activity: Not on file  Other Topics Concern  . Not on file  Social History Narrative  . Not on file      Review of Systems  All other systems reviewed and are negative.      Objective:   Physical Exam  Constitutional: He appears well-developed.  Non-toxic appearance. He has a sickly appearance. He does not appear ill. No distress.  HENT:  Nose: Mucosal edema and rhinorrhea present.  Neck: Neck supple. No JVD present.  Cardiovascular: Normal rate and normal heart sounds.  Pulmonary/Chest: Effort normal and breath sounds normal. No respiratory distress. He has no wheezes. He has no rales.  Abdominal: Soft. Bowel sounds are normal. He exhibits no distension. There is no tenderness. There is no rebound.  Musculoskeletal: He exhibits edema.  Lymphadenopathy:    He has no cervical adenopathy.  Skin: He is not diaphoretic.  Vitals reviewed.         Assessment & Plan:  Cough  Acute bacterial rhinosinusitis I believe the patient likely has an upper respiratory infection causing his cough and his head congestion and sinus congestion and rhinorrhea.  Given his advanced age and multiple medical comorbidities, I will add Augmentin 875 mg p.o. twice daily for 10 days given the fact the cough is persisted now for more than a week and his symptoms seemingly are getting worse.  He can use Xyzal 5 mg p.o. daily as a decongestant.  Recheck later this week if no  better or sooner if worse

## 2018-01-19 ENCOUNTER — Inpatient Hospital Stay: Payer: Medicare Other | Attending: Hematology and Oncology

## 2018-01-19 ENCOUNTER — Inpatient Hospital Stay: Payer: Medicare Other

## 2018-01-19 DIAGNOSIS — D693 Immune thrombocytopenic purpura: Secondary | ICD-10-CM | POA: Diagnosis not present

## 2018-01-19 DIAGNOSIS — D631 Anemia in chronic kidney disease: Secondary | ICD-10-CM | POA: Diagnosis not present

## 2018-01-19 DIAGNOSIS — D539 Nutritional anemia, unspecified: Secondary | ICD-10-CM

## 2018-01-19 DIAGNOSIS — J9601 Acute respiratory failure with hypoxia: Secondary | ICD-10-CM

## 2018-01-19 DIAGNOSIS — D46C Myelodysplastic syndrome with isolated del(5q) chromosomal abnormality: Secondary | ICD-10-CM

## 2018-01-19 DIAGNOSIS — N183 Chronic kidney disease, stage 3 unspecified: Secondary | ICD-10-CM

## 2018-01-19 DIAGNOSIS — D696 Thrombocytopenia, unspecified: Secondary | ICD-10-CM

## 2018-01-19 LAB — CBC WITH DIFFERENTIAL/PLATELET
Basophils Absolute: 0 10*3/uL (ref 0.0–0.1)
Basophils Relative: 0 %
EOS ABS: 0 10*3/uL (ref 0.0–0.5)
EOS PCT: 1 %
HCT: 34.7 % — ABNORMAL LOW (ref 38.4–49.9)
Hemoglobin: 10.3 g/dL — ABNORMAL LOW (ref 13.0–17.1)
LYMPHS ABS: 2 10*3/uL (ref 0.9–3.3)
Lymphocytes Relative: 36 %
MCH: 24.2 pg — AB (ref 27.2–33.4)
MCHC: 29.7 g/dL — AB (ref 32.0–36.0)
MCV: 81.5 fL (ref 79.3–98.0)
MONOS PCT: 16 %
Monocytes Absolute: 0.9 10*3/uL (ref 0.1–0.9)
Neutro Abs: 2.6 10*3/uL (ref 1.5–6.5)
Neutrophils Relative %: 47 %
PLATELETS: 124 10*3/uL — AB (ref 140–400)
RBC: 4.26 MIL/uL (ref 4.20–5.82)
RDW: 27.4 % — ABNORMAL HIGH (ref 11.0–14.6)
WBC: 5.5 10*3/uL (ref 4.0–10.3)

## 2018-01-19 MED ORDER — ROMIPLOSTIM INJECTION 500 MCG
684.0000 ug | SUBCUTANEOUS | Status: DC
  Administered 2018-01-19: 684 ug via SUBCUTANEOUS
  Filled 2018-01-19: qty 1

## 2018-01-26 ENCOUNTER — Inpatient Hospital Stay: Payer: Medicare Other

## 2018-01-26 DIAGNOSIS — D696 Thrombocytopenia, unspecified: Secondary | ICD-10-CM

## 2018-01-26 DIAGNOSIS — D631 Anemia in chronic kidney disease: Secondary | ICD-10-CM

## 2018-01-26 DIAGNOSIS — D539 Nutritional anemia, unspecified: Secondary | ICD-10-CM

## 2018-01-26 DIAGNOSIS — N183 Chronic kidney disease, stage 3 unspecified: Secondary | ICD-10-CM

## 2018-01-26 DIAGNOSIS — D693 Immune thrombocytopenic purpura: Secondary | ICD-10-CM

## 2018-01-26 DIAGNOSIS — D46C Myelodysplastic syndrome with isolated del(5q) chromosomal abnormality: Secondary | ICD-10-CM

## 2018-01-26 DIAGNOSIS — J9601 Acute respiratory failure with hypoxia: Secondary | ICD-10-CM

## 2018-01-26 LAB — CBC WITH DIFFERENTIAL/PLATELET
BASOS ABS: 0 10*3/uL (ref 0.0–0.1)
Basophils Relative: 0 %
EOS ABS: 0 10*3/uL (ref 0.0–0.5)
Eosinophils Relative: 0 %
HCT: 34.7 % — ABNORMAL LOW (ref 38.4–49.9)
Hemoglobin: 10 g/dL — ABNORMAL LOW (ref 13.0–17.1)
LYMPHS ABS: 1.8 10*3/uL (ref 0.9–3.3)
LYMPHS PCT: 52 %
MCH: 23.8 pg — AB (ref 27.2–33.4)
MCHC: 28.8 g/dL — AB (ref 32.0–36.0)
MCV: 82.4 fL (ref 79.3–98.0)
Monocytes Absolute: 0.4 10*3/uL (ref 0.1–0.9)
Monocytes Relative: 12 %
NEUTROS ABS: 1.2 10*3/uL — AB (ref 1.5–6.5)
Neutrophils Relative %: 36 %
PLATELETS: 49 10*3/uL — AB (ref 140–400)
RBC: 4.21 MIL/uL (ref 4.20–5.82)
RDW: 27.3 % — AB (ref 11.0–14.6)
WBC: 3.4 10*3/uL — ABNORMAL LOW (ref 4.0–10.3)

## 2018-01-26 MED ORDER — DARBEPOETIN ALFA 200 MCG/0.4ML IJ SOSY
PREFILLED_SYRINGE | INTRAMUSCULAR | Status: AC
  Filled 2018-01-26: qty 0.4

## 2018-01-26 MED ORDER — ROMIPLOSTIM INJECTION 500 MCG
684.0000 ug | SUBCUTANEOUS | Status: DC
  Administered 2018-01-26: 684 ug via SUBCUTANEOUS
  Filled 2018-01-26: qty 1

## 2018-01-26 NOTE — Progress Notes (Signed)
Spoke w/ [name], platelet count is borderline at 49 today. Plan to keep same dose (~8 mcg/kg) today and if platelets are still low next week may increase dose then.   Demetrius Charity, PharmD Oncology Pharmacist Pharmacy Phone: [phone] 01/26/2018

## 2018-01-27 ENCOUNTER — Encounter: Payer: Self-pay | Admitting: Internal Medicine

## 2018-01-27 ENCOUNTER — Ambulatory Visit (INDEPENDENT_AMBULATORY_CARE_PROVIDER_SITE_OTHER): Payer: Medicare Other | Admitting: Internal Medicine

## 2018-01-27 VITALS — BP 132/78 | HR 60 | Ht 68.0 in | Wt 184.4 lb

## 2018-01-27 DIAGNOSIS — R0609 Other forms of dyspnea: Secondary | ICD-10-CM | POA: Diagnosis not present

## 2018-01-27 DIAGNOSIS — J90 Pleural effusion, not elsewhere classified: Secondary | ICD-10-CM

## 2018-01-27 DIAGNOSIS — R9389 Abnormal findings on diagnostic imaging of other specified body structures: Secondary | ICD-10-CM

## 2018-01-27 NOTE — Progress Notes (Signed)
Subjective:    Patient ID: Ricardo Watkins, male    DOB: [birth year],   MRN: [id number]    Brief patient profile:  84 yowm  With remote follicular lymphoma/ now  MDS quit smoking 1960 referred to pulmonary clinic 11/14/2015 by Dr [name] for L pleual effusion first detected 10/30/14     History of Present Illness  11/14/2015 1st [facility] office visit/ [name]   Chief Complaint  Patient presents with  . Pulmonary Consult    Referred by [name] for eval of pleural effusion. He c/o cough for the past 4-5 months. Cough is esp worse when he lies down and is prod with clear sputum. He has SOB when walking up an incline.   indolent onset progressively severe 24/7 dry coughing fits x 5 months with only mild doe with exertion/ cough worse supine, never productive, no assoc wheeze and no better with  otc cough meds rec Please see patient coordinator before you leave today  to schedule thoracentesis of Your Left side and I will call you with the results Stop acuupril and start diovan 160 mg daily    - L thoracentesis 11/24/2015 > 1.6 liters of hazy, amber fluid >>   Wbc 1859 L/M > P and exudative by Prot, nl glucose/ cyt>> neg/ predominently T cells, no evidence of lymphoma or chylous effusion      -  L pleurex 02/10/16 for non-malignant effusion  -  #28 F chest tube placed 04/08/16 by Lucianne Lei tright for dx empyema during admit:   Admit date: 04/03/2016 Discharge date: 04/20/2016 A    Brief/Interim Summary: 84 y.o.malewith medical history significant of non-Hodgkin's lymphoma, CAD, type 2 diabetes, hyperlipidemia, hypertension, anemia, prostate CA, diverticulosis, cataracts, DVT, chronic cough, chronic left pleural effusion with insertion of left pleural catheter on 11/10/2015 by [name] who comes to the hospital due to fever, chills, worsening fatigue and malaise. Per patient, his daughter and wife, his chest tube drainage has been decreasing his output for the pastseveral days and  he stopped completely the day PTA. He normally has up to 500 mL of drainage every other day. Patient was worked up and found to be septic and have a loculated effusion. TCV following patient, he went to OR on 10/19 and had the non functioning catheter removed and placed a new pigtail. Patient had a second chest tube placed in an adjacent collection on 10/20 which was removed on 10/22, currently with only one chest tube in place with minimal drainage. Initial drainage cultures positive for coag negative staph sensitive to penicillins, on Zosyn.   Discharge Diagnoses:  Sepsis due to loculated Effusion/Empyema from Left Obstructed Pleural Catheter for recurrent and chronic Non Malignant Pleural Effusions with hypoxic respiratory failure - CT Scan on admission showed Moderate-sized loculated left pleural effusion collection with a PleurX drainage catheter in place.Patient was placed on broad-spectrum antibiotics of vancomycin and Zosyn  - Sepsis physiology resolved; Old left PleurX catheter removed by TCTS10/19. Vancomycin was discontinued on 10/22, MRSAPCR negative. - IR placed CT Guided Fluid Drain by PigTail Catheter 10/16 and drained ~550 mL. Patient taken to OR 10/19 with removal of old catheter and placement of a left sided chest tube  - post op had significant leukocytosis, up to 25 >> 23.9 >>17.3>>15.5>>19.9>>15.2>>13.4>>10.5>>8.8 - removed one of thechest tubes10/22 per TCTS - Fluid collected on 10/19 during surgery grew coagulase-negative staph which is pansensitive.  -10/27--d/c zosyn, start augmentin--plan 28 days of abx total (start date 10/14)--last day of  abx 05/01/16           Admit date: 06/02/2017 Discharge date: 06/04/2017    Discharge Diagnoses:      Active Hospital Problems   Diagnosis Date Noted  . Acute kidney injury superimposed on chronic kidney disease ([place]) 06/02/2017  . Flu-like symptoms 06/02/2017  . Elevated lactic acid level 06/02/2017  . CKD  (chronic kidney disease), stage IV ([place]) 12/16/2016  . Chronic ITP (idiopathic thrombocytopenia) ([facility]) 07/01/2016  . Diarrhea 04/08/2015  . Pancytopenia ([place]) 04/08/2015  . MDS (myelodysplastic syndrome) with 5q deletion ([place]) 11/20/2014  . Thrombocytopenia ([place]) 04/19/2014  . HTN (hypertension) 08/26/2010  . Diabetes ([place]) 08/25/2010    Resolved Hospital Problems  No resolved problems to display.    Discharge Condition: Stable  Diet recommendation: Heart healthy      Vitals:   06/03/17 2108 06/04/17 0515  BP: 138/77 (!) 146/88  Pulse: 98 92  Resp: (!) 25 (!) 22  Temp: 98.7 F (37.1 C) (!) 97.5 F (36.4 C)  SpO2: 95% 98%    History of present illness:  84 y.o.malewith medical history significantfor MDS, stroke, chronic kidney disease hypertension, diabetes, CAD presents to the emergency department from his PCPs office chief complaint 3 day history of decreased oral intake nausea abdominal pain leading to generalized weakness. Initial evaluation reveals acute on chronic kidney disease,elevated lactic acid mild tachycardia in the setting of dehydration.   Hospital Course:  Principal Problem:   Acute kidney injury superimposed on chronic kidney disease ([place]) Active Problems:   HTN (hypertension)   Diabetes ([facility])   Thrombocytopenia ([facility])   MDS (myelodysplastic syndrome) with 5q deletion ([facility])   Pancytopenia ([facility])   Diarrhea   Chronic ITP (idiopathic thrombocytopenia) ([facility])   CKD (chronic kidney disease), stage IV ([facility])   Flu-like symptoms   Elevated lactic acid level  1. Acute kidney injury superimposed on chronic kidney disease stage III. Resolved  Likely related to decreased oral intake in the setting of a likely viral illness Encourage adequate hydration  #2. Flulike symptoms/elevated lactic acid/generalized weakness. Resolved  Lactic acid trending down to 1.3 from 2.5. Likely related to dehydration in the setting of intermittent nausea/abdominal  pain. Chest x-ray no edema or consolidation. CT of the abdomen with diverticulosis no diverticulitis. Max temp 100.6no leukocytosis hypoxic hemodynamically stable. Influenza negative at PCP office -Home PT       07/26/2017  f/u ov/[name] re:  Re-establish re abn ct chest s/p admit  Chief Complaint  Patient presents with  . pulmonary consult    Referred by Dr. Dennard Schaumann for nodule on CT scan1/2019  doe =  MMRC2 = can't walk a nl pace on a flat grade s sob but does fine slow and flat eg walking [place] [employer] No cough  Sleeping ok  rec Please schedule a follow up visit in 3 months but call sooner if needed with cxr on return     10/26/2017  f/u ov/[name] re: abn ct  Chief Complaint  Patient presents with  . Follow-up    Breathing is overall doing well. He has been coughing more over the past wk- non prod. Cough is esp worse at night and occ wakes him up.   Dyspnea:  MMRC2 = can't walk a nl pace on a flat grade s sob but does fine slow and flat eg still does [employer] Cough: x sev weeks assoc rhinitis Sleep: disturbed by cough and sense of pnds  SABA use:  None  rec Zyrtec 10 mg at  bedtime as needed for drippy nose - available over the counter      01/27/2018  f/u ov/[name] re:  Chief Complaint  Patient presents with  . Follow-up    Increased SOB and cough for the past 2 wks. Cough is non prod.    Dyspnea:  Stopped walking at [employer] x one month/ mb and back is ok = MMRC2 = can't walk a nl pace on a flat grade s sob but does fine slow and flat  Cough: daytime and non prod  SABA use: non 02: no     No obvious day to day or daytime variability or assoc excess/ purulent sputum or mucus plugs or hemoptysis or cp or chest tightness, subjective wheeze or overt sinus or hb symptoms.   Sleeping: bed flat/ head on 2pillows  without nocturnal  or early am exacerbation  of respiratory  c/o's or need for noct saba. Also denies any obvious fluctuation of symptoms with weather or environmental changes or  other aggravating or alleviating factors except as outlined above   No unusual exposure hx or h/o childhood pna/ asthma or knowledge of premature birth.  Current Allergies, Complete Past Medical History, Past Surgical History, Family History, and Social History were reviewed in [employer] record.  ROS  The following are not active complaints unless bolded Hoarseness, sore throat, dysphagia, dental problems, itching, sneezing,  nasal congestion or discharge of excess mucus or purulent secretions, ear ache,   fever, chills, sweats, unintended wt loss or wt gain, classically pleuritic or exertional cp,  orthopnea pnd or arm/hand swelling  or leg swelling, presyncope, palpitations, abdominal pain, anorexia, nausea, vomiting, diarrhea  or change in bowel habits or change in bladder habits, change in stools or change in urine, dysuria, hematuria,  rash, arthralgias, visual complaints, headache, numbness, weakness or ataxia or problems with walking or coordination,  change in mood or  memory.        Current Meds  Medication Sig  . acetaminophen (TYLENOL) 500 MG tablet Take 1,000 mg by mouth every 6 (six) hours as needed for mild pain, moderate pain or headache.   . calamine lotion Apply 1 application topically as needed for itching.  . clopidogrel (PLAVIX) 75 MG tablet TAKE 1 TABLET BY MOUTH ONCE A DAY WITH BREAKFAST  . cyanocobalamin (,VITAMIN B-12,) 1000 MCG/ML injection Inject 1 mL (1,000 mcg total) into the muscle every 30 (thirty) days.  . furosemide (LASIX) 40 MG tablet Take 1 tablet (40 mg total) by mouth daily. (Patient taking differently: Take 20 mg by mouth daily. )  . gabapentin (NEURONTIN) 100 MG capsule Take 1 capsule (100 mg total) by mouth at bedtime.  . hydrocortisone cream 1 % Apply 1 application topically at bedtime as needed for itching.  . Insulin Glargine (LANTUS SOLOSTAR) 100 UNIT/ML Solostar Pen INJECT 25 UNITS EVERY NIGHT AT BEDTIME (Patient taking  differently: Inject 30 Units into the skin every morning. )  . levocetirizine (XYZAL) 5 MG tablet Take 1 tablet (5 mg total) by mouth every evening.  . loperamide (IMODIUM) 2 MG capsule Take 2 mg by mouth as needed for diarrhea or loose stools.  . meclizine (ANTIVERT) 12.5 MG tablet Take 1 tablet (12.5 mg total) by mouth 3 (three) times daily as needed for dizziness.  . metoprolol succinate (TOPROL-XL) 100 MG 24 hr tablet TAKE 1 TABLET BY MOUTH ONCE DAILY  . Multiple Vitamin (MULTIVITAMIN WITH MINERALS) TABS tablet Take 1 tablet by mouth at bedtime.  Marland Kitchen NON  FORMULARY Inject 1 Dose into the vein once a week. Platelet booster  . OVER THE COUNTER MEDICATION Place 1 drop into both eyes daily as needed (dry eyes). Over the counter lubricating eye drop  . Saccharomyces boulardii (PROBIOTIC) 250 MG CAPS Take 1 capsule by mouth daily.  . tamsulosin (FLOMAX) 0.4 MG CAPS capsule TAKE 1 CAPSULE BY MOUTH ONCE DAILY  . traMADol (ULTRAM) 50 MG tablet Take 1 tablet (50 mg total) by mouth every 6 (six) hours as needed for moderate pain.   Current Facility-Administered Medications for the 01/27/18 encounter (Office Visit) with Tanda Rockers, MD  Medication  . cyanocobalamin ((VITAMIN B-12)) injection 1,000 mcg                    Objective:   Physical Exam   01/27/2018          184  10/26/2017          190  07/26/2017          197  01/27/2016          194  12/12/2015       191   11/14/15 193 lb 6.4 oz (87.726 kg)  11/13/15 193 lb (87.544 kg)  11/12/15 194 lb 11.2 oz (88.315 kg)    Vital signs reviewed - Note on arrival 02 sats  96% on RA       HEENT: nl   turbinates bilaterally, and oropharynx. Nl external ear canals without cough reflex - poor dentition    NECK :  without JVD/Nodes/TM/ nl carotid upstrokes bilaterally   LUNGS: no acc muscle use,  Nl contour chest which is clear to A and P bilaterally without cough on insp or exp maneuvers   CV:  RRR  no s3   II/VI SEM  - No  increase in P2,  and no edema   ABD:  soft and nontender with nl inspiratory excursion in the supine position. No bruits or organomegaly appreciated, bowel sounds nl  MS:  Nl gait/ ext warm without deformities, calf tenderness, cyanosis or clubbing No obvious joint restrictions   SKIN: warm and dry without lesions    NEURO:  alert, approp, nl sensorium with  no motor or cerebellar deficits apparent.         I personally reviewed images and agree with radiology impression as follows:  CXR:   01/09/18  Mild chronic bronchitic-smoking related changes. No alveolar pneumonia nor CHF. Chronic but improving pleural thickening and/or tiny pleural effusion on the left.   Labs  reviewed:      Chemistry      Component Value Date/Time   NA 139 01/09/2018 1238   NA 138 03/02/2016 1238   K 5.4 (H) 01/09/2018 1238   K 4.6 03/02/2016 1238   CL 104 01/09/2018 1238   CL 107 09/27/2012 0940   CO2 26 01/09/2018 1238   CO2 25 03/02/2016 1238   BUN 25 01/09/2018 1238   BUN 20.9 03/02/2016 1238   CREATININE 1.74 (H) 01/09/2018 1238   CREATININE 1.8 (H) 03/02/2016 1238      Component Value Date/Time   CALCIUM 9.1 01/09/2018 1238   CALCIUM 8.4 03/02/2016 1238   ALKPHOS 56 11/04/2017 0935   ALKPHOS 84 03/02/2016 1238   AST 19 11/04/2017 0935   AST 21 03/02/2016 1238   ALT 16 (L) 11/04/2017 0935   ALT 29 03/02/2016 1238   BILITOT 0.7 11/04/2017 0935   BILITOT <0.30 03/02/2016 1238  Lab Results  Component Value Date   WBC 3.4 (L) 01/26/2018   HGB 10.0 (L) 01/26/2018   HCT 34.7 (L) 01/26/2018   MCV 82.4 01/26/2018   PLT 49 (L) 01/26/2018        Lab Results  Component Value Date   TSH 1.992 06/02/2017     Lab Results  Component Value Date   PROBNP 642 01/09/18        Lab Results  Component Value Date   ESRSEDRATE 19 01/27/2016   ESRSEDRATE 1 10/31/2014   ESRSEDRATE 4 08/10/2010               Assessment & Plan:

## 2018-01-27 NOTE — Patient Instructions (Addendum)
Please see patient coordinator before you leave today  to schedule echocardiogram and I will call you with results   If fluid and legs and breathing get worse> take full lasix daily until better then back to one half    Please schedule a follow up visit in 3 months but call sooner if needed with cxr on return

## 2018-01-27 NOTE — Telephone Encounter (Signed)
error 

## 2018-01-28 ENCOUNTER — Encounter: Payer: Self-pay | Admitting: Internal Medicine

## 2018-01-28 NOTE — Assessment & Plan Note (Signed)
See ct chest 06/28/17 - cxr 10/26/2017 s macroscopic changes > same 01/09/18   No evidence of "macroscopic" changes = no need to pursue aggressively esp if there are other explanations for his dyspnea.  Will f/u in 3 months cxr only unless  Oncology needs more data for decision making   Discussed in detail all the  indications, usual  risks and alternatives  relative to the benefits with patient who agrees to proceed with conservative f/u as outlined     I had an extended discussion with the patient/fm reviewing all relevant studies completed to date and  lasting 15 to 20 minutes of a 25 minute visit    Each maintenance medication was reviewed in detail including most importantly the difference between maintenance and prns and under what circumstances the prns are to be triggered using an action plan format that is not reflected in the computer generated alphabetically organized AVS.    Please see AVS for specific instructions unique to this visit that I personally wrote and verbalized to the the pt in detail and then reviewed with pt  by my nurse highlighting any  changes in therapy recommended at today's visit to their plan of care.

## 2018-01-28 NOTE — Assessment & Plan Note (Signed)
first detected 10/30/14  On PET  - See CT chest 11/11/15 - L thoracentesis 11/24/2015 > 1.6 liters of hazy, amber fluid >>   Wbc 1859 L/M > P and exudative by Prot, nl glucose/ cyt>> neg/ predominently T cells, no evidence of lymphoma or chylous effusion   - Re check cxr 12/12/2015 > back to baseline L effusion  - 01/27/2016 worse again > rec repeat T centesis and then T surg eval for ?VATS? - 01/27/16 Quantiferon Gold TB :  neg  - 01/30/2016 repeat u/s t centesis x 2.2 liters:   Exudate by prot/ wbc 1432 with 91% L and neg cyt/flow cytometry/ nl glucose and trig > refer to T surg 02/04/2016 >  -  L pleurex 02/10/16 for non-malignant effusion  -  #28 F chest tube placed 04/08/16 by Lucianne Lei tright for dx empyema with finger debridement of loculations    No significant recurrence  By cxr 01/09/18

## 2018-01-28 NOTE — Assessment & Plan Note (Signed)
Echo 07/02/2014  Normal LV function; grade 1 diastolic dysfunction; moderate LAE; calcified aortic valve with mild AS; mildly elevated pulmonary pressure. - BNP  01/27/2016 = 311  But up to 642  01/09/18   - Echo ordered 01/27/2018   I suspect worsening diastolic dysfunction / ? AS based on hx/ exam > repeat echo approp at this point,use extra lasix if swelling recurs pending echo and ? Refer to cards ? Depending on results   Discussed in detail all the  indications, usual  risks and alternatives  relative to the benefits with patient who agrees to proceed with w/u as outlined.

## 2018-02-02 ENCOUNTER — Inpatient Hospital Stay: Payer: Medicare Other

## 2018-02-02 VITALS — BP 141/69 | HR 64 | Temp 97.7°F | Resp 18

## 2018-02-02 DIAGNOSIS — D693 Immune thrombocytopenic purpura: Secondary | ICD-10-CM

## 2018-02-02 DIAGNOSIS — D631 Anemia in chronic kidney disease: Secondary | ICD-10-CM

## 2018-02-02 DIAGNOSIS — D539 Nutritional anemia, unspecified: Secondary | ICD-10-CM

## 2018-02-02 DIAGNOSIS — D696 Thrombocytopenia, unspecified: Secondary | ICD-10-CM

## 2018-02-02 DIAGNOSIS — D46C Myelodysplastic syndrome with isolated del(5q) chromosomal abnormality: Secondary | ICD-10-CM

## 2018-02-02 DIAGNOSIS — J9601 Acute respiratory failure with hypoxia: Secondary | ICD-10-CM

## 2018-02-02 DIAGNOSIS — N183 Chronic kidney disease, stage 3 unspecified: Secondary | ICD-10-CM

## 2018-02-02 LAB — CBC WITH DIFFERENTIAL/PLATELET
Basophils Absolute: 0 10*3/uL (ref 0.0–0.1)
Basophils Relative: 0 %
EOS PCT: 0 %
Eosinophils Absolute: 0 10*3/uL (ref 0.0–0.5)
HCT: 34 % — ABNORMAL LOW (ref 38.4–49.9)
Hemoglobin: 9.9 g/dL — ABNORMAL LOW (ref 13.0–17.1)
LYMPHS ABS: 1.8 10*3/uL (ref 0.9–3.3)
LYMPHS PCT: 60 %
MCH: 24.1 pg — ABNORMAL LOW (ref 27.2–33.4)
MCHC: 29.1 g/dL — AB (ref 32.0–36.0)
MCV: 82.7 fL (ref 79.3–98.0)
MONO ABS: 0.3 10*3/uL (ref 0.1–0.9)
MONOS PCT: 11 %
Neutro Abs: 0.9 10*3/uL — ABNORMAL LOW (ref 1.5–6.5)
Neutrophils Relative %: 29 %
PLATELETS: 117 10*3/uL — AB (ref 140–400)
RBC: 4.11 MIL/uL — ABNORMAL LOW (ref 4.20–5.82)
RDW: 26.8 % — AB (ref 11.0–14.6)
WBC: 3 10*3/uL — ABNORMAL LOW (ref 4.0–10.3)

## 2018-02-02 MED ORDER — ROMIPLOSTIM INJECTION 500 MCG
684.0000 ug | SUBCUTANEOUS | Status: DC
  Administered 2018-02-02: 684 ug via SUBCUTANEOUS
  Filled 2018-02-02: qty 1

## 2018-02-02 MED ORDER — DARBEPOETIN ALFA 200 MCG/0.4ML IJ SOSY
200.0000 ug | PREFILLED_SYRINGE | Freq: Once | INTRAMUSCULAR | Status: AC
  Administered 2018-02-02: 200 ug via SUBCUTANEOUS

## 2018-02-02 MED ORDER — DARBEPOETIN ALFA 200 MCG/0.4ML IJ SOSY
PREFILLED_SYRINGE | INTRAMUSCULAR | Status: AC
  Filled 2018-02-02: qty 0.4

## 2018-02-02 NOTE — Patient Instructions (Signed)
Darbepoetin Alfa injection What is this medicine? DARBEPOETIN ALFA (dar be POE e tin AL fa) helps your body make more red blood cells. It is used to treat anemia caused by chronic kidney failure and chemotherapy. This medicine may be used for other purposes; ask your health care provider or pharmacist if you have questions. COMMON BRAND NAME(S): Aranesp What should I tell my health care provider before I take this medicine? They need to know if you have any of these conditions: -blood clotting disorders or history of blood clots -cancer patient not on chemotherapy -cystic fibrosis -heart disease, such as angina, heart failure, or a history of a heart attack -hemoglobin level of 12 g/dL or greater -high blood pressure -low levels of folate, iron, or vitamin B12 -seizures -an unusual or allergic reaction to darbepoetin, erythropoietin, albumin, hamster proteins, latex, other medicines, foods, dyes, or preservatives -pregnant or trying to get pregnant -breast-feeding How should I use this medicine? This medicine is for injection into a vein or under the skin. It is usually given by a health care professional in a hospital or clinic setting. If you get this medicine at home, you will be taught how to prepare and give this medicine. Use exactly as directed. Take your medicine at regular intervals. Do not take your medicine more often than directed. It is important that you put your used needles and syringes in a special sharps container. Do not put them in a trash can. If you do not have a sharps container, call your pharmacist or healthcare provider to get one. A special MedGuide will be given to you by the pharmacist with each prescription and refill. Be sure to read this information carefully each time. Talk to your pediatrician regarding the use of this medicine in children. While this medicine may be used in children as young as 1 year for selected conditions, precautions do  apply. Overdosage: If you think you have taken too much of this medicine contact a poison control center or emergency room at once. NOTE: This medicine is only for you. Do not share this medicine with others. What if I miss a dose? If you miss a dose, take it as soon as you can. If it is almost time for your next dose, take only that dose. Do not take double or extra doses. What may interact with this medicine? Do not take this medicine with any of the following medications: -epoetin alfa This list may not describe all possible interactions. Give your health care provider a list of all the medicines, herbs, non-prescription drugs, or dietary supplements you use. Also tell them if you smoke, drink alcohol, or use illegal drugs. Some items may interact with your medicine. What should I watch for while using this medicine? Your condition will be monitored carefully while you are receiving this medicine. You may need blood work done while you are taking this medicine. What side effects may I notice from receiving this medicine? Side effects that you should report to your doctor or health care professional as soon as possible: -allergic reactions like skin rash, itching or hives, swelling of the face, lips, or tongue -breathing problems -changes in vision -chest pain -confusion, trouble speaking or understanding -feeling faint or lightheaded, falls -high blood pressure -muscle aches or pains -pain, swelling, warmth in the leg -rapid weight gain -severe headaches -sudden numbness or weakness of the face, arm or leg -trouble walking, dizziness, loss of balance or coordination -seizures (convulsions) -swelling of the ankles, feet, hands -  unusually weak or tired Side effects that usually do not require medical attention (report to your doctor or health care professional if they continue or are bothersome): -diarrhea -fever, chills (flu-like symptoms) -headaches -nausea, vomiting -redness,  stinging, or swelling at site where injected This list may not describe all possible side effects. Call your doctor for medical advice about side effects. You may report side effects to FDA at 1-800-FDA-1088. Where should I keep my medicine? Keep out of the reach of children. Store in a refrigerator between 2 and 8 degrees C (36 and 46 degrees F). Do not freeze. Do not shake. Throw away any unused portion if using a single-dose vial. Throw away any unused medicine after the expiration date. NOTE: This sheet is a summary. It may not cover all possible information. If you have questions about this medicine, talk to your doctor, pharmacist, or health care provider.  2018 Elsevier/Gold Standard (2016-01-26 19:52:26) Romiplostim injection What is this medicine? ROMIPLOSTIM (roe mi PLOE stim) helps your body make more platelets. This medicine is used to treat low platelets caused by chronic idiopathic thrombocytopenic purpura (ITP). This medicine may be used for other purposes; ask your health care provider or pharmacist if you have questions. COMMON BRAND NAME(S): Nplate What should I tell my health care provider before I take this medicine? They need to know if you have any of these conditions: -cancer or myelodysplastic syndrome -low blood counts, like low white cell, platelet, or red cell counts -take medicines that treat or prevent blood clots -an unusual or allergic reaction to romiplostim, mannitol, other medicines, foods, dyes, or preservatives -pregnant or trying to get pregnant -breast-feeding How should I use this medicine? This medicine is for injection under the skin. It is given by a health care professional in a hospital or clinic setting. A special MedGuide will be given to you before your injection. Read this information carefully each time. Talk to your pediatrician regarding the use of this medicine in children. Special care may be needed. Overdosage: If you think you have taken  too much of this medicine contact a poison control center or emergency room at once. NOTE: This medicine is only for you. Do not share this medicine with others. What if I miss a dose? It is important not to miss your dose. Call your doctor or health care professional if you are unable to keep an appointment. What may interact with this medicine? Interactions are not expected. This list may not describe all possible interactions. Give your health care provider a list of all the medicines, herbs, non-prescription drugs, or dietary supplements you use. Also tell them if you smoke, drink alcohol, or use illegal drugs. Some items may interact with your medicine. What should I watch for while using this medicine? Your condition will be monitored carefully while you are receiving this medicine. Visit your prescriber or health care professional for regular checks on your progress and for the needed blood tests. It is important to keep all appointments. What side effects may I notice from receiving this medicine? Side effects that you should report to your doctor or health care professional as soon as possible: -allergic reactions like skin rash, itching or hives, swelling of the face, lips, or tongue -shortness of breath, chest pain, swelling in a leg -unusual bleeding or bruising Side effects that usually do not require medical attention (report to your doctor or health care professional if they continue or are bothersome): -dizziness -headache -muscle aches -pain in arms and   legs -stomach pain -trouble sleeping This list may not describe all possible side effects. Call your doctor for medical advice about side effects. You may report side effects to FDA at 1-800-FDA-1088. Where should I keep my medicine? This drug is given in a hospital or clinic and will not be stored at home. NOTE: This sheet is a summary. It may not cover all possible information. If you have questions about this medicine,  talk to your doctor, pharmacist, or health care provider.  2018 Elsevier/Gold Standard (2008-02-05 15:13:04)  

## 2018-02-03 ENCOUNTER — Ambulatory Visit (HOSPITAL_COMMUNITY): Payer: Medicare Other | Attending: Internal Medicine

## 2018-02-03 ENCOUNTER — Other Ambulatory Visit: Payer: Self-pay

## 2018-02-03 DIAGNOSIS — I509 Heart failure, unspecified: Secondary | ICD-10-CM | POA: Diagnosis not present

## 2018-02-03 DIAGNOSIS — I700 Atherosclerosis of aorta: Secondary | ICD-10-CM | POA: Diagnosis not present

## 2018-02-03 DIAGNOSIS — I313 Pericardial effusion (noninflammatory): Secondary | ICD-10-CM | POA: Diagnosis not present

## 2018-02-03 DIAGNOSIS — E785 Hyperlipidemia, unspecified: Secondary | ICD-10-CM | POA: Insufficient documentation

## 2018-02-03 DIAGNOSIS — I2510 Atherosclerotic heart disease of native coronary artery without angina pectoris: Secondary | ICD-10-CM | POA: Diagnosis not present

## 2018-02-03 DIAGNOSIS — R0609 Other forms of dyspnea: Secondary | ICD-10-CM | POA: Diagnosis not present

## 2018-02-03 DIAGNOSIS — N183 Chronic kidney disease, stage 3 (moderate): Secondary | ICD-10-CM | POA: Diagnosis not present

## 2018-02-03 DIAGNOSIS — I130 Hypertensive heart and chronic kidney disease with heart failure and stage 1 through stage 4 chronic kidney disease, or unspecified chronic kidney disease: Secondary | ICD-10-CM | POA: Insufficient documentation

## 2018-02-03 NOTE — Progress Notes (Signed)
LMTCB

## 2018-02-08 ENCOUNTER — Telehealth: Payer: Self-pay | Admitting: Internal Medicine

## 2018-02-08 NOTE — Telephone Encounter (Signed)
Notes recorded by Tanda Rockers, MD on 02/03/2018 at 3:13 PM EDT Call patient : Study is c/w elevated heart pressures though relatively mild may be contributing to symptoms > refer to cards ------------------------------------------ Attempted to call pt's daughter, Juliann Pulse. I did not receive an answer. I have left a message for Juliann Pulse to return our call.

## 2018-02-08 NOTE — Telephone Encounter (Signed)
Spoke with pt's daughter, Ricardo Watkins. She is aware of the pt's echo results. States that the pt already has a Film/video editor and will call them and make him an appointment. Nothing further was needed.

## 2018-02-08 NOTE — Telephone Encounter (Signed)
Juliann Pulse, pt daughter, is returning call. Cb is [phone]

## 2018-02-09 ENCOUNTER — Inpatient Hospital Stay: Payer: Medicare Other

## 2018-02-09 DIAGNOSIS — D693 Immune thrombocytopenic purpura: Secondary | ICD-10-CM

## 2018-02-09 DIAGNOSIS — D696 Thrombocytopenia, unspecified: Secondary | ICD-10-CM

## 2018-02-09 DIAGNOSIS — D539 Nutritional anemia, unspecified: Secondary | ICD-10-CM

## 2018-02-09 DIAGNOSIS — N183 Chronic kidney disease, stage 3 unspecified: Secondary | ICD-10-CM

## 2018-02-09 DIAGNOSIS — D631 Anemia in chronic kidney disease: Secondary | ICD-10-CM

## 2018-02-09 DIAGNOSIS — J9601 Acute respiratory failure with hypoxia: Secondary | ICD-10-CM

## 2018-02-09 DIAGNOSIS — D46C Myelodysplastic syndrome with isolated del(5q) chromosomal abnormality: Secondary | ICD-10-CM

## 2018-02-09 LAB — CBC WITH DIFFERENTIAL/PLATELET
Basophils Absolute: 0 10*3/uL (ref 0.0–0.1)
Basophils Relative: 0 %
Eosinophils Absolute: 0 10*3/uL (ref 0.0–0.5)
Eosinophils Relative: 0 %
HEMATOCRIT: 32.3 % — AB (ref 38.4–49.9)
HEMOGLOBIN: 9.5 g/dL — AB (ref 13.0–17.1)
LYMPHS PCT: 62 %
Lymphs Abs: 1.8 10*3/uL (ref 0.9–3.3)
MCH: 24.2 pg — ABNORMAL LOW (ref 27.2–33.4)
MCHC: 29.4 g/dL — AB (ref 32.0–36.0)
MCV: 82.2 fL (ref 79.3–98.0)
MONO ABS: 0.3 10*3/uL (ref 0.1–0.9)
MONOS PCT: 11 %
NEUTROS ABS: 0.8 10*3/uL — AB (ref 1.5–6.5)
Neutrophils Relative %: 27 %
Platelets: 152 10*3/uL (ref 140–400)
RBC: 3.93 MIL/uL — AB (ref 4.20–5.82)
RDW: 27.6 % — AB (ref 11.0–14.6)
WBC: 2.9 10*3/uL — ABNORMAL LOW (ref 4.0–10.3)

## 2018-02-09 MED ORDER — ROMIPLOSTIM INJECTION 500 MCG
684.0000 ug | SUBCUTANEOUS | Status: DC
  Administered 2018-02-09: 684 ug via SUBCUTANEOUS
  Filled 2018-02-09: qty 0.37

## 2018-02-16 ENCOUNTER — Ambulatory Visit (INDEPENDENT_AMBULATORY_CARE_PROVIDER_SITE_OTHER): Payer: Medicare Other | Admitting: *Deleted

## 2018-02-16 ENCOUNTER — Other Ambulatory Visit: Payer: Self-pay

## 2018-02-16 ENCOUNTER — Inpatient Hospital Stay: Payer: Medicare Other

## 2018-02-16 VITALS — BP 154/72 | Temp 98.0°F | Resp 18

## 2018-02-16 DIAGNOSIS — J9601 Acute respiratory failure with hypoxia: Secondary | ICD-10-CM

## 2018-02-16 DIAGNOSIS — D539 Nutritional anemia, unspecified: Secondary | ICD-10-CM

## 2018-02-16 DIAGNOSIS — D631 Anemia in chronic kidney disease: Secondary | ICD-10-CM

## 2018-02-16 DIAGNOSIS — D46C Myelodysplastic syndrome with isolated del(5q) chromosomal abnormality: Secondary | ICD-10-CM

## 2018-02-16 DIAGNOSIS — D693 Immune thrombocytopenic purpura: Secondary | ICD-10-CM | POA: Diagnosis not present

## 2018-02-16 DIAGNOSIS — E538 Deficiency of other specified B group vitamins: Secondary | ICD-10-CM | POA: Diagnosis not present

## 2018-02-16 DIAGNOSIS — N183 Chronic kidney disease, stage 3 unspecified: Secondary | ICD-10-CM

## 2018-02-16 DIAGNOSIS — D696 Thrombocytopenia, unspecified: Secondary | ICD-10-CM

## 2018-02-16 LAB — CBC WITH DIFFERENTIAL/PLATELET
BASOS PCT: 0 %
Basophils Absolute: 0 10*3/uL (ref 0.0–0.1)
EOS PCT: 0 %
Eosinophils Absolute: 0 10*3/uL (ref 0.0–0.5)
HCT: 33.7 % — ABNORMAL LOW (ref 38.4–49.9)
Hemoglobin: 10 g/dL — ABNORMAL LOW (ref 13.0–17.1)
Lymphocytes Relative: 69 %
Lymphs Abs: 1.8 10*3/uL (ref 0.9–3.3)
MCH: 24.5 pg — ABNORMAL LOW (ref 27.2–33.4)
MCHC: 29.7 g/dL — ABNORMAL LOW (ref 32.0–36.0)
MCV: 82.6 fL (ref 79.3–98.0)
Monocytes Absolute: 0.3 10*3/uL (ref 0.1–0.9)
Monocytes Relative: 10 %
NEUTROS ABS: 0.5 10*3/uL — AB (ref 1.5–6.5)
Neutrophils Relative %: 21 %
PLATELETS: 112 10*3/uL — AB (ref 140–400)
RBC: 4.08 MIL/uL — AB (ref 4.20–5.82)
RDW: 27.6 % — AB (ref 11.0–14.6)
WBC: 2.6 10*3/uL — AB (ref 4.0–10.3)

## 2018-02-16 MED ORDER — ROMIPLOSTIM INJECTION 500 MCG
684.0000 ug | SUBCUTANEOUS | Status: DC
  Administered 2018-02-16: 684 ug via SUBCUTANEOUS
  Filled 2018-02-16: qty 1

## 2018-02-16 MED ORDER — DARBEPOETIN ALFA 200 MCG/0.4ML IJ SOSY
PREFILLED_SYRINGE | INTRAMUSCULAR | Status: AC
  Filled 2018-02-16: qty 0.4

## 2018-02-16 MED ORDER — DARBEPOETIN ALFA 200 MCG/0.4ML IJ SOSY
200.0000 ug | PREFILLED_SYRINGE | Freq: Once | INTRAMUSCULAR | Status: AC
  Administered 2018-02-16: 200 ug via SUBCUTANEOUS

## 2018-02-16 NOTE — Patient Instructions (Signed)
Romiplostim injection What is this medicine? ROMIPLOSTIM (roe mi PLOE stim) helps your body make more platelets. This medicine is used to treat low platelets caused by chronic idiopathic thrombocytopenic purpura (ITP). This medicine may be used for other purposes; ask your health care provider or pharmacist if you have questions. COMMON BRAND NAME(S): Nplate What should I tell my health care provider before I take this medicine? They need to know if you have any of these conditions: -cancer or myelodysplastic syndrome -low blood counts, like low white cell, platelet, or red cell counts -take medicines that treat or prevent blood clots -an unusual or allergic reaction to romiplostim, mannitol, other medicines, foods, dyes, or preservatives -pregnant or trying to get pregnant -breast-feeding How should I use this medicine? This medicine is for injection under the skin. It is given by a health care professional in a hospital or clinic setting. A special MedGuide will be given to you before your injection. Read this information carefully each time. Talk to your pediatrician regarding the use of this medicine in children. Special care may be needed. Overdosage: If you think you have taken too much of this medicine contact a poison control center or emergency room at once. NOTE: This medicine is only for you. Do not share this medicine with others. What if I miss a dose? It is important not to miss your dose. Call your doctor or health care professional if you are unable to keep an appointment. What may interact with this medicine? Interactions are not expected. This list may not describe all possible interactions. Give your health care provider a list of all the medicines, herbs, non-prescription drugs, or dietary supplements you use. Also tell them if you smoke, drink alcohol, or use illegal drugs. Some items may interact with your medicine. What should I watch for while using this  medicine? Your condition will be monitored carefully while you are receiving this medicine. Visit your prescriber or health care professional for regular checks on your progress and for the needed blood tests. It is important to keep all appointments. What side effects may I notice from receiving this medicine? Side effects that you should report to your doctor or health care professional as soon as possible: -allergic reactions like skin rash, itching or hives, swelling of the face, lips, or tongue -shortness of breath, chest pain, swelling in a leg -unusual bleeding or bruising Side effects that usually do not require medical attention (report to your doctor or health care professional if they continue or are bothersome): -dizziness -headache -muscle aches -pain in arms and legs -stomach pain -trouble sleeping This list may not describe all possible side effects. Call your doctor for medical advice about side effects. You may report side effects to FDA at 1-800-FDA-1088. Where should I keep my medicine? This drug is given in a hospital or clinic and will not be stored at home. NOTE: This sheet is a summary. It may not cover all possible information. If you have questions about this medicine, talk to your doctor, pharmacist, or health care provider.  2018 Elsevier/Gold Standard (2008-02-05 15:13:04)  

## 2018-02-16 NOTE — Progress Notes (Signed)
Pt here today for injection appointment.  Pt.'s HGB 10 and Platelets 112 per plan parameters Pt. to receive aranesp and n plate Both injections given today, Pt. Tolerated both injections well, No further problems or concerns noted. Pt. Left clinic alert oriented and ambulatory with assistance from a cane.

## 2018-02-16 NOTE — Progress Notes (Signed)
Patient seen in office for Vitamin B 12 injection.   Tolerated IM administration well in R arm.

## 2018-02-19 ENCOUNTER — Emergency Department (HOSPITAL_COMMUNITY): Payer: Medicare Other

## 2018-02-19 ENCOUNTER — Encounter (HOSPITAL_COMMUNITY): Payer: Self-pay | Admitting: Emergency Medicine

## 2018-02-19 ENCOUNTER — Other Ambulatory Visit: Payer: Self-pay

## 2018-02-19 ENCOUNTER — Inpatient Hospital Stay (HOSPITAL_COMMUNITY)
Admission: EM | Admit: 2018-02-19 | Discharge: 2018-02-21 | DRG: 291 | Disposition: A | Discharge status: Home Health | Payer: Medicare Other | Attending: Internal Medicine | Admitting: Internal Medicine

## 2018-02-19 DIAGNOSIS — I130 Hypertensive heart and chronic kidney disease with heart failure and stage 1 through stage 4 chronic kidney disease, or unspecified chronic kidney disease: Principal | ICD-10-CM | POA: Diagnosis present

## 2018-02-19 DIAGNOSIS — I5043 Acute on chronic combined systolic (congestive) and diastolic (congestive) heart failure: Secondary | ICD-10-CM | POA: Diagnosis not present

## 2018-02-19 DIAGNOSIS — I5089 Other heart failure: Secondary | ICD-10-CM | POA: Diagnosis not present

## 2018-02-19 DIAGNOSIS — Z794 Long term (current) use of insulin: Secondary | ICD-10-CM

## 2018-02-19 DIAGNOSIS — Z888 Allergy status to other drugs, medicaments and biological substances status: Secondary | ICD-10-CM

## 2018-02-19 DIAGNOSIS — E1151 Type 2 diabetes mellitus with diabetic peripheral angiopathy without gangrene: Secondary | ICD-10-CM | POA: Diagnosis present

## 2018-02-19 DIAGNOSIS — R079 Chest pain, unspecified: Secondary | ICD-10-CM | POA: Diagnosis not present

## 2018-02-19 DIAGNOSIS — E1122 Type 2 diabetes mellitus with diabetic chronic kidney disease: Secondary | ICD-10-CM | POA: Diagnosis present

## 2018-02-19 DIAGNOSIS — Z825 Family history of asthma and other chronic lower respiratory diseases: Secondary | ICD-10-CM

## 2018-02-19 DIAGNOSIS — Z9861 Coronary angioplasty status: Secondary | ICD-10-CM

## 2018-02-19 DIAGNOSIS — E538 Deficiency of other specified B group vitamins: Secondary | ICD-10-CM

## 2018-02-19 DIAGNOSIS — I2510 Atherosclerotic heart disease of native coronary artery without angina pectoris: Secondary | ICD-10-CM | POA: Diagnosis present

## 2018-02-19 DIAGNOSIS — Z803 Family history of malignant neoplasm of breast: Secondary | ICD-10-CM

## 2018-02-19 DIAGNOSIS — R0602 Shortness of breath: Secondary | ICD-10-CM | POA: Diagnosis not present

## 2018-02-19 DIAGNOSIS — Z8546 Personal history of malignant neoplasm of prostate: Secondary | ICD-10-CM

## 2018-02-19 DIAGNOSIS — E785 Hyperlipidemia, unspecified: Secondary | ICD-10-CM | POA: Diagnosis present

## 2018-02-19 DIAGNOSIS — I509 Heart failure, unspecified: Secondary | ICD-10-CM

## 2018-02-19 DIAGNOSIS — K5790 Diverticulosis of intestine, part unspecified, without perforation or abscess without bleeding: Secondary | ICD-10-CM | POA: Diagnosis present

## 2018-02-19 DIAGNOSIS — N183 Chronic kidney disease, stage 3 (moderate): Secondary | ICD-10-CM | POA: Diagnosis present

## 2018-02-19 DIAGNOSIS — Z86010 Personal history of colonic polyps: Secondary | ICD-10-CM | POA: Diagnosis not present

## 2018-02-19 DIAGNOSIS — Z8572 Personal history of non-Hodgkin lymphomas: Secondary | ICD-10-CM

## 2018-02-19 DIAGNOSIS — Z8042 Family history of malignant neoplasm of prostate: Secondary | ICD-10-CM

## 2018-02-19 DIAGNOSIS — I110 Hypertensive heart disease with heart failure: Secondary | ICD-10-CM | POA: Diagnosis not present

## 2018-02-19 DIAGNOSIS — Z807 Family history of other malignant neoplasms of lymphoid, hematopoietic and related tissues: Secondary | ICD-10-CM

## 2018-02-19 DIAGNOSIS — E876 Hypokalemia: Secondary | ICD-10-CM | POA: Diagnosis present

## 2018-02-19 DIAGNOSIS — Z7902 Long term (current) use of antithrombotics/antiplatelets: Secondary | ICD-10-CM

## 2018-02-19 DIAGNOSIS — E559 Vitamin D deficiency, unspecified: Secondary | ICD-10-CM | POA: Diagnosis present

## 2018-02-19 DIAGNOSIS — Z87891 Personal history of nicotine dependence: Secondary | ICD-10-CM

## 2018-02-19 DIAGNOSIS — Z8249 Family history of ischemic heart disease and other diseases of the circulatory system: Secondary | ICD-10-CM

## 2018-02-19 DIAGNOSIS — Z833 Family history of diabetes mellitus: Secondary | ICD-10-CM

## 2018-02-19 HISTORY — DX: Heart failure, unspecified: I50.9

## 2018-02-19 HISTORY — DX: Dyspnea, unspecified: R06.00

## 2018-02-19 LAB — BASIC METABOLIC PANEL
ANION GAP: 5 (ref 5–15)
BUN: 23 mg/dL (ref 8–23)
CHLORIDE: 110 mmol/L (ref 98–111)
CO2: 26 mmol/L (ref 22–32)
Calcium: 9 mg/dL (ref 8.9–10.3)
Creatinine, Ser: 1.77 mg/dL — ABNORMAL HIGH (ref 0.61–1.24)
GFR calc Af Amer: 39 mL/min — ABNORMAL LOW (ref >60)
GFR calc non Af Amer: 33 mL/min — ABNORMAL LOW (ref >60)
GLUCOSE: 151 mg/dL — AB (ref 70–99)
POTASSIUM: 3.9 mmol/L (ref 3.5–5.1)
Sodium: 141 mmol/L (ref 135–145)

## 2018-02-19 LAB — GLUCOSE, CAPILLARY
GLUCOSE-CAPILLARY: 164 mg/dL — AB (ref 70–99)
Glucose-Capillary: 140 mg/dL — ABNORMAL HIGH (ref 70–99)

## 2018-02-19 LAB — CBC
HEMATOCRIT: 37.1 % — AB (ref 39.0–52.0)
HEMOGLOBIN: 10.4 g/dL — AB (ref 13.0–17.0)
MCH: 24.2 pg — AB (ref 26.0–34.0)
MCHC: 28 g/dL — AB (ref 30.0–36.0)
MCV: 86.5 fL (ref 78.0–100.0)
Platelets: 112 10*3/uL — ABNORMAL LOW (ref 150–400)
RBC: 4.29 MIL/uL (ref 4.22–5.81)
RDW: 27.7 % — AB (ref 11.5–15.5)
WBC: 2.8 10*3/uL — ABNORMAL LOW (ref 4.0–10.5)

## 2018-02-19 LAB — I-STAT TROPONIN, ED: Troponin i, poc: 0 ng/mL (ref 0.00–0.08)

## 2018-02-19 LAB — BRAIN NATRIURETIC PEPTIDE: B Natriuretic Peptide: 820.7 pg/mL — ABNORMAL HIGH (ref 0.0–100.0)

## 2018-02-19 LAB — PROTIME-INR
INR: 1.13
Prothrombin Time: 14.4 seconds (ref 11.4–15.2)

## 2018-02-19 MED ORDER — INSULIN ASPART 100 UNIT/ML ~~LOC~~ SOLN
0.0000 [IU] | Freq: Three times a day (TID) | SUBCUTANEOUS | Status: DC
  Administered 2018-02-19: 2 [IU] via SUBCUTANEOUS

## 2018-02-19 MED ORDER — CLOPIDOGREL BISULFATE 75 MG PO TABS
75.0000 mg | ORAL_TABLET | Freq: Every day | ORAL | Status: DC
  Administered 2018-02-19 – 2018-02-21 (×3): 75 mg via ORAL
  Filled 2018-02-19 (×3): qty 1

## 2018-02-19 MED ORDER — CYANOCOBALAMIN 1000 MCG/ML IJ SOLN: 1000.0000 ug | INTRAMUSCULAR | Status: DC

## 2018-02-19 MED ORDER — SODIUM CHLORIDE 0.9% FLUSH: 3.0000 mL | INTRAVENOUS | Status: DC | PRN

## 2018-02-19 MED ORDER — ACETAMINOPHEN 500 MG PO TABS
1000.0000 mg | ORAL_TABLET | Freq: Three times a day (TID) | ORAL | Status: DC | PRN
  Administered 2018-02-19 – 2018-02-21 (×4): 1000 mg via ORAL
  Filled 2018-02-19 (×4): qty 2

## 2018-02-19 MED ORDER — INSULIN GLARGINE 100 UNIT/ML SOLOSTAR PEN: 25.0000 [IU] | PEN_INJECTOR | SUBCUTANEOUS | Status: DC

## 2018-02-19 MED ORDER — SODIUM CHLORIDE 0.9 % IV SOLN: 250.0000 mL | INTRAVENOUS | Status: DC | PRN

## 2018-02-19 MED ORDER — INSULIN GLARGINE 100 UNIT/ML ~~LOC~~ SOLN
25.0000 [IU] | Freq: Every day | SUBCUTANEOUS | Status: DC
  Administered 2018-02-20 – 2018-02-21 (×2): 25 [IU] via SUBCUTANEOUS
  Filled 2018-02-19 (×2): qty 0.25

## 2018-02-19 MED ORDER — LOPERAMIDE HCL 2 MG PO CAPS: 2.0000 mg | ORAL_CAPSULE | ORAL | Status: DC | PRN

## 2018-02-19 MED ORDER — HEPARIN SODIUM (PORCINE) 5000 UNIT/ML IJ SOLN
5000.0000 [IU] | Freq: Three times a day (TID) | INTRAMUSCULAR | Status: DC
  Administered 2018-02-19 – 2018-02-21 (×5): 5000 [IU] via SUBCUTANEOUS
  Filled 2018-02-19 (×6): qty 1

## 2018-02-19 MED ORDER — SODIUM CHLORIDE 0.9% FLUSH
3.0000 mL | Freq: Two times a day (BID) | INTRAVENOUS | Status: DC
  Administered 2018-02-19 – 2018-02-21 (×5): 3 mL via INTRAVENOUS

## 2018-02-19 MED ORDER — TRAMADOL HCL 50 MG PO TABS: 50.0000 mg | ORAL_TABLET | Freq: Four times a day (QID) | ORAL | Status: DC | PRN

## 2018-02-19 MED ORDER — METOPROLOL SUCCINATE ER 100 MG PO TB24
100.0000 mg | ORAL_TABLET | Freq: Every day | ORAL | Status: DC
  Administered 2018-02-19 – 2018-02-21 (×3): 100 mg via ORAL
  Filled 2018-02-19 (×3): qty 1

## 2018-02-19 MED ORDER — FUROSEMIDE 10 MG/ML IJ SOLN
40.0000 mg | Freq: Every day | INTRAMUSCULAR | Status: DC
  Administered 2018-02-19 – 2018-02-21 (×3): 40 mg via INTRAVENOUS
  Filled 2018-02-19 (×3): qty 4

## 2018-02-19 MED ORDER — OVER THE COUNTER MEDICATION: 1.0000 [drp] | Freq: Every day | Status: DC | PRN

## 2018-02-19 MED ORDER — TAMSULOSIN HCL 0.4 MG PO CAPS
0.4000 mg | ORAL_CAPSULE | Freq: Every day | ORAL | Status: DC
  Administered 2018-02-19 – 2018-02-21 (×3): 0.4 mg via ORAL
  Filled 2018-02-19 (×3): qty 1

## 2018-02-19 MED ORDER — FUROSEMIDE 10 MG/ML IJ SOLN
40.0000 mg | Freq: Once | INTRAMUSCULAR | Status: AC
  Administered 2018-02-19: 40 mg via INTRAVENOUS
  Filled 2018-02-19: qty 4

## 2018-02-19 MED ORDER — ADULT MULTIVITAMIN W/MINERALS CH
1.0000 | ORAL_TABLET | Freq: Every day | ORAL | Status: DC
  Administered 2018-02-19 – 2018-02-20 (×2): 1 via ORAL
  Filled 2018-02-19 (×2): qty 1

## 2018-02-19 NOTE — ED Notes (Signed)
Patient ambulated with own cane to bathroom to have a bowel movement and returned steady gait denies shortness of breath and oxygen level 95% room air.

## 2018-02-19 NOTE — H&P (Signed)
History and Physical  Ricardo Watkins:[id number] DOB: 10/16/1930 DOA: 02/19/2018  Referring physician: Emergency room MD PCP: [name], MD  Outpatient Specialists: Cardiology Patient coming from: Home & is able to ambulate   Chief Complaint: Substernal chest pain left-sided and shortness of breath for 2 weeks ago was last night it woke him up from sleep.  Also wife noted that he has not been acting himself he has been sleeping more than usual.  HPI: Ricardo Watkins is a 84 y.o. male with medical history significant for congestive heart failure coronary artery disease S/P PTCA and angioplasty 2004, diabetes mellitus hyperlipidemia hypertension, and renal insufficiency presented to the emergency department today with complains of substernal chest pain left-sided and shortness of breath for 2 weeks ago was last night it woke him up from sleep.  Also wife noted that he has not been acting himself he has been sleeping more than usual.  Wife stated that patient sees his cardiologist on the outside who had him on Lasix for his congestive heart failure but recently decreased it because he was doing well and to prevent any effect on his kidney but unfortunately the dose was lowered from 40 mg to 20 and he started to be low back fluid up.  His wife said he usually catches on time by increasing the Lasix whenever they noticed that his weight is increasing or he is beginning to build up fluid in his legs with edema but it did not catch at this time. At present he denies any chest pain or shortness of breath   ED Course: Patient was given  80 mg of Lasix IV  Review of Systems: He had chest pain or shortness of breath with lower extremity edema  Pt denies any nausea vomiting or diarrhea, headache blurry vision fever cough weakness numbness or abdominal pain no syncope no near syncope or lightheadedness.  Review of systems are otherwise negative   Past Medical History:  Diagnosis Date  . Adenomatous  colon polyp   . CAD (coronary artery disease)    30% LAD Stenosis, 70% ramus intermedius stenosis, treated with PTCA and angioplasty by Dr Albertine Patricia 2004  . Cataract    right eye  . Cough, persistent 11/04/2015  . Deficiency anemia 04/19/2014  . Diverticulosis   . DM (diabetes mellitus) ([place])   . DVT (deep venous thrombosis) ([place])    secondary to surgery  . Dyslipidemia   . Hyperlipidemia   . Hypertension   . Inguinal hernia    right  . 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 Microcytic anemia 07/07/2015  . 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 Non Hodgkin's lymphoma ([place])   . Pleural effusion, left 11/04/2015  . 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 PVD (peripheral vascular disease) ([place])    rt renal artery stent  . Renal insufficiency   . Thrombocytopenia ([place])   . Thrombotic stroke (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 Thyroid nodule    left lower lobe (annual monitoring).  . Vitamin D deficiency    Past Surgical History:  Procedure Laterality Date  . APPENDECTOMY     patient ?  Marland Kitchen CARDIAC CATHETERIZATION    . CHEST TUBE INSERTION Left 02/10/2016   Procedure:  INSERTION PLEURAL DRAINAGE CATHETER;  Surgeon: [name], MD;  Location: [facility];  Service: Thoracic;  Laterality: Left;  . CHEST TUBE INSERTION Left 04/08/2016   Procedure: CHEST TUBE INSERTION;  Surgeon: [name], MD;  Location: [facility];  Service: Thoracic;  Laterality: Left;  . COLONOSCOPY    . CORONARY ANGIOPLASTY    . CYSTOURETHROSCOPY     ROBOTIC ARM NUCLETRON SEED IMPLANTATION OF PROSTATE  . EXPLORATORY LAPAROTOMY     For evaluation of lymphoma  . REMOVAL OF PLEURAL DRAINAGE CATHETER Left 04/08/2016   Procedure: REMOVAL OF PLEURAL DRAINAGE CATHETER;  Surgeon: [name], MD;  Location: [facility]  OR;  Service: Thoracic;  Laterality: Left;    Social History:  reports that he quit smoking about 59 years ago. His smoking use included pipe and cigarettes. He has a 10.00 pack-year smoking history. He has never used smokeless tobacco. He reports that he does not drink alcohol or use drugs.   Allergies  Allergen Reactions  . Glucophage [Metformin Hydrochloride] Other (See Comments)    Chest pain  . Zetia [Ezetimibe] Other (See Comments)    weakness  . Fenofibrate Rash  . Niacin-Lovastatin Er Rash    Family History  Problem Relation Age of Onset  . Lymphoma Sister   . Prostate cancer Brother   . Breast cancer Sister   . Diabetes Brother   . Diabetes Sister   . Heart disease Brother   . Asthma Son       Prior to Admission medications   Medication Sig Start Date End Date Taking? Authorizing Provider  acetaminophen (TYLENOL) 500 MG tablet Take 1,000 mg by mouth every 6 (six) hours as needed for mild pain, moderate pain or headache.    Yes [provider]  clopidogrel (PLAVIX) 75 MG tablet TAKE 1 TABLET BY MOUTH ONCE A DAY WITH BREAKFAST Patient taking differently: Take 75 mg by mouth daily.  04/18/17  Yes [name], MD  furosemide (LASIX) 40 MG tablet Take 1 tablet (40 mg total) by mouth daily. Patient taking differently: Take 20-40 mg by mouth See admin instructions. Take 20mg  daily. Except when excess swelling or fluttering/wheezing in the chest take 40mg  11/08/17  Yes [name], MD  Insulin Glargine (LANTUS SOLOSTAR) 100 UNIT/ML Solostar Pen INJECT 25 UNITS EVERY NIGHT AT BEDTIME Patient taking differently: Inject 25 Units into the skin every morning.  06/09/16  Yes [name], MD  loperamide (IMODIUM) 2 MG capsule Take 2 mg by mouth as needed for diarrhea or loose stools.   Yes [provider]  metoprolol succinate (TOPROL-XL) 100 MG 24 hr tablet TAKE 1 TABLET BY MOUTH ONCE DAILY Patient taking differently: Take 100 mg by mouth daily.   12/28/17  Yes [name], MD  Multiple Vitamin (MULTIVITAMIN WITH MINERALS) TABS tablet Take 1 tablet by mouth at bedtime.   Yes [provider]  NON FORMULARY Inject 1 Dose into the vein once a week. Platelet booster   Yes [provider]  OVER THE COUNTER MEDICATION Place 1 drop into both eyes daily as needed (dry eyes). Over the counter lubricating eye drop   Yes [provider]  tamsulosin (FLOMAX) 0.4 MG CAPS capsule TAKE 1 CAPSULE BY MOUTH ONCE DAILY Patient taking differently: Take 0.4 mg by mouth daily.  07/13/17  Yes [name], MD  traMADol (ULTRAM) 50 MG tablet Take 1 tablet (50 mg total) by mouth every 6 (six) hours as needed for moderate  pain. 01/03/18  Yes [name], MD  gabapentin (NEURONTIN) 100 MG capsule Take 1 capsule (100 mg total) by mouth at bedtime. Patient not taking: Reported on 02/19/2018 10/31/17   [name], MD  levocetirizine (XYZAL) 5 MG tablet Take 1 tablet (5 mg total) by mouth every evening. Patient not taking: Reported on 02/19/2018 01/16/18   [name], MD  meclizine (ANTIVERT) 12.5 MG tablet Take 1 tablet (12.5 mg total) by mouth 3 (three) times daily as needed for dizziness. Patient not taking: Reported on 02/19/2018 06/27/15   [name], MD    Physical Exam: BP (!) 156/67   Pulse 95   Temp 97.6 F (36.4 C) (Oral)   Resp (!) 24   Ht 5\' 6"  (1.676 m)   Wt 81.6 kg   SpO2 96%   BMI 29.05 kg/m   General: Well-nourished well-developed alert and oriented x3 Eyes: No pallor no edema ENT: No rhinorrhea neck is supple oropharyngeal no erythema or exudate Neck: Neck is supple Cardiovascular: Regular rate and rhythm Respiratory: Slight basilar rales otherwise normal Abdomen: Soft normal active bowel sounds nondistended no hepatosplenomegaly Skin: No rashes dry warm Musculoskeletal: Moves all 4 extremities Psychiatric: Appropriate Neurologic: No neurological signs of deficit noted            Labs on Admission:  Basic Metabolic Panel: Recent Labs  Lab 02/19/18 0635  NA 141  K 3.9  CL 110  CO2 26  GLUCOSE 151*  BUN 23  CREATININE 1.77*  CALCIUM 9.0   Liver Function Tests: No results for input(s): AST, ALT, ALKPHOS, BILITOT, PROT, ALBUMIN in the last 168 hours. No results for input(s): LIPASE, AMYLASE in the last 168 hours. No results for input(s): AMMONIA in the last 168 hours. CBC: Recent Labs  Lab 02/16/18 1432 02/19/18 0635  WBC 2.6* 2.8*  NEUTROABS 0.5*  --   HGB 10.0* 10.4*  HCT 33.7* 37.1*  MCV 82.6 86.5  PLT 112* 112*   Cardiac Enzymes: No results for input(s): CKTOTAL, CKMB, CKMBINDEX, TROPONINI in the last 168 hours.  BNP (last 3 results) Recent Labs    11/08/17 1226 01/09/18 1238 02/19/18 0635  BNP 266* 642* 820.7*    ProBNP (last 3 results) No results for input(s): PROBNP in the last 8760 hours.  CBG: No results for input(s): GLUCAP in the last 168 hours.  Radiological Exams on Admission: Dg Chest 2 View  Result Date: 02/19/2018 CLINICAL DATA:  84 year old male with chest pain. EXAM: CHEST - 2 VIEW COMPARISON:  Chest radiograph dated 01/09/2018 FINDINGS: There is mild eventration of the right hemidiaphragm. Mild perihilar and central streaky densities may represent atelectatic changes or mild vascular congestion. There is a small left pleural effusion. No focal consolidation or pneumothorax. Stable mild cardiomegaly. No acute osseous pathology. IMPRESSION: Cardiomegaly with probable mild vascular congestion and small left pleural effusion. No focal consolidation. Electronically Signed   By: Anner Crete M.D.   On: 02/19/2018 06:57    EKG: Independently reviewed.   Assessment/Plan Present on Admission: **None**  Chronic CHF with acute exacerbation.  He was given IV Lasix in the emergency room will complete his IV Lasix diuresis. Coronary artery disease stable Type 2 diabetes mellitus stable He will be admitted to telemetry   Observation IV diuresis and management of his CHF   Active Problems:   * No active hospital problems. *   DVT prophylaxis: Heparin  Code Status: Modified code  Family Communication: family  Disposition Plan: home  Consults called: None  Admission status: ob    Cristal Deer MD [facility] Pager [phone]  If 7PM-7AM, please contact night-coverage [email or web address] Password [email or web address]  02/19/2018, 2:00 PM

## 2018-02-19 NOTE — ED Provider Notes (Signed)
[facility] Provider Note   CSN: [id number] Arrival date & time: 02/19/18  2725     History   Chief Complaint Chief Complaint  Patient presents with  . Chest Pain  . Shortness of Breath    HPI Zeek Rostron Kreitz is a 84 y.o. male.  HPI Patient presents to the emergency department with an onset of shortness of breath and chest discomfort that woke him up at 4 AM.  The patient states that he woke with the symptoms.  He states that he has been having trouble with this over the last few months.  Patient states that they been cutting back on his Lasix as well due to the fact that his kidneys are impaired.  Patient states nothing seems make the condition better but exertion seemed to make his shortness of breath worse.  The wife states they did give him an extra Lasix today.  The patient denies headache,blurred vision, neck pain, fever, cough, weakness, numbness, dizziness, anorexia, abdominal pain, nausea, vomiting, diarrhea, rash, back pain, dysuria, hematemesis, bloody stool, near syncope, or syncope. Past Medical History:  Diagnosis Date  . Adenomatous colon polyp   . CAD (coronary artery disease)    30% LAD Stenosis, 70% ramus intermedius stenosis, treated with PTCA and angioplasty by Dr Albertine Patricia 2004  . Cataract    right eye  . Cough, persistent 11/04/2015  . Deficiency anemia 04/19/2014  . Diverticulosis   . DM (diabetes mellitus) ([place])   . DVT (deep venous thrombosis) ([place])    secondary to surgery  . Dyslipidemia   . Hyperlipidemia   . Hypertension   . Inguinal hernia    right  . 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 Microcytic anemia 07/07/2015  . 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 Non Hodgkin's lymphoma ([place])   . Pleural effusion, left 11/04/2015  . Prostate CA ([place]) 09/10/2011   Gleason 3+3 R, 3+4 L lobe May 2007 Rx Radioactive seed implants Dr [name]  . PVD (peripheral vascular disease) ([place])    rt renal  artery stent  . Renal insufficiency   . Thrombocytopenia ([place])   . 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 Thyroid nodule    left lower lobe (annual monitoring).  . Vitamin D deficiency     Patient Active Problem List   Diagnosis Date Noted  . CHF (congestive heart failure) ([place]) 02/19/2018  . Upper airway cough syndrome 10/27/2017  . Abnormal CT of the chest 07/27/2017  . Flu-like symptoms 06/02/2017  . Elevated lactic acid level 06/02/2017  . Acute kidney injury superimposed on chronic kidney disease ([place]) 06/02/2017  . CKD (chronic kidney disease), stage IV ([place]) 12/16/2016  . Bilateral carotid artery stenosis 12/11/2016  . Dyslipidemia 12/11/2016  . Anemia in chronic kidney disease 07/01/2016  . Chronic ITP (idiopathic thrombocytopenia) ([facility]) 07/01/2016  . B12 deficiency anemia   . Chest tube in place   . Acute diastolic CHF (congestive heart failure) ([place]) 04/16/2016  . Acute respiratory failure with hypoxia ([place]) 04/14/2016  . Empyema lung ([place])   . Type 2 diabetes mellitus ([place]) 04/04/2016  . Empyema ([place]) 04/03/2016  . Thyroid nodule 03/03/2016  . Sepsis ([place]) 02/17/2016  . DOE (dyspnea on exertion) 01/27/2016  . Pulmonary edema with congestive heart failure ([place]) 11/12/2015  . Cough, persistent 11/04/2015  .  Pleural effusion, left 11/04/2015  . Anorexia 08/08/2015  . Microcytic anemia 07/07/2015  . Pancytopenia ([place]) 04/08/2015  . Diarrhea 04/08/2015  . Encounter for chemotherapy management 04/02/2015  . Neutropenia ([place]) 02/18/2015  . 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 Deficiency anemia 04/19/2014  . Thrombocytopenia ([place]) 04/19/2014  . Vitamin B12 deficiency 04/19/2014  . Chronic renal failure, stage 3 (moderate) ([place]) 04/19/2014  . Macrocytic anemia 03/20/2013  . Monocytosis 03/20/2013  . Thrombotic stroke  ([place]) 09/10/2011  . Prostate CA ([place]) 09/10/2011  . Inguinal hernia   . Colon polyps   . CAD (coronary artery disease) 08/26/2010  . HTN (hypertension) 08/26/2010  . Murmur 08/26/2010  . MURMUR 08/26/2010  . History of lymphoma 08/25/2010  . Diabetes ([place]) 08/25/2010  . DYSLIPIDEMIA 08/25/2010  . THROMBOCYTOPENIA 08/25/2010  . Essential hypertension 08/25/2010  . Coronary atherosclerosis 08/25/2010  . PVD 08/25/2010    Past Surgical History:  Procedure Laterality Date  . APPENDECTOMY     patient ?  Marland Kitchen CARDIAC CATHETERIZATION    . CHEST TUBE INSERTION Left 02/10/2016   Procedure: INSERTION PLEURAL DRAINAGE CATHETER;  Surgeon: [name], MD;  Location: [facility];  Service: Thoracic;  Laterality: Left;  . CHEST TUBE INSERTION Left 04/08/2016   Procedure: CHEST TUBE INSERTION;  Surgeon: [name], MD;  Location: [facility];  Service: Thoracic;  Laterality: Left;  . COLONOSCOPY    . CORONARY ANGIOPLASTY    . CYSTOURETHROSCOPY     ROBOTIC ARM NUCLETRON SEED IMPLANTATION OF PROSTATE  . EXPLORATORY LAPAROTOMY     For evaluation of lymphoma  . REMOVAL OF PLEURAL DRAINAGE CATHETER Left 04/08/2016   Procedure: REMOVAL OF PLEURAL DRAINAGE CATHETER;  Surgeon: [name], MD;  Location: [facility];  Service: Thoracic;  Laterality: Left;        Home Medications    Prior to Admission medications   Medication Sig Start Date End Date Taking? Authorizing Provider  acetaminophen (TYLENOL) 500 MG tablet Take 1,000 mg by mouth every 6 (six) hours as needed for mild pain, moderate pain or headache.    Yes [provider]  clopidogrel (PLAVIX) 75 MG tablet TAKE 1 TABLET BY MOUTH ONCE A DAY WITH BREAKFAST Patient taking differently: Take 75 mg by mouth daily.  04/18/17  Yes [name], MD  furosemide (LASIX) 40 MG tablet Take 1 tablet (40 mg total) by mouth daily. Patient taking differently: Take 20-40 mg by mouth See admin instructions. Take 20mg  daily. Except when excess  swelling or fluttering/wheezing in the chest take 40mg  11/08/17  Yes Pickard, Cammie Mcgee, MD  Insulin Glargine (LANTUS SOLOSTAR) 100 UNIT/ML Solostar Pen INJECT 25 UNITS EVERY NIGHT AT BEDTIME Patient taking differently: Inject 25 Units into the skin every morning.  06/09/16  Yes [name], MD  loperamide (IMODIUM) 2 MG capsule Take 2 mg by mouth as needed for diarrhea or loose stools.   Yes [provider]  metoprolol succinate (TOPROL-XL) 100 MG 24 hr tablet TAKE 1 TABLET BY MOUTH ONCE DAILY Patient taking differently: Take 100 mg by mouth daily.  12/28/17  Yes [name], MD  Multiple Vitamin (MULTIVITAMIN WITH MINERALS) TABS tablet Take 1 tablet by mouth at bedtime.   Yes [provider]  NON FORMULARY Inject 1 Dose into the vein once a week. Platelet booster   Yes [provider]  OVER THE COUNTER MEDICATION Place 1 drop into both eyes daily as needed (dry eyes). Over the counter  lubricating eye drop   Yes [provider]  tamsulosin (FLOMAX) 0.4 MG CAPS capsule TAKE 1 CAPSULE BY MOUTH ONCE DAILY Patient taking differently: Take 0.4 mg by mouth daily.  07/13/17  Yes [name], MD  traMADol (ULTRAM) 50 MG tablet Take 1 tablet (50 mg total) by mouth every 6 (six) hours as needed for moderate pain. 01/03/18  Yes [name], MD  gabapentin (NEURONTIN) 100 MG capsule Take 1 capsule (100 mg total) by mouth at bedtime. Patient not taking: Reported on 02/19/2018 10/31/17   [name], MD  levocetirizine (XYZAL) 5 MG tablet Take 1 tablet (5 mg total) by mouth every evening. Patient not taking: Reported on 02/19/2018 01/16/18   [name], MD  meclizine (ANTIVERT) 12.5 MG tablet Take 1 tablet (12.5 mg total) by mouth 3 (three) times daily as needed for dizziness. Patient not taking: Reported on 02/19/2018 06/27/15   [name], MD    Family History Family History  Problem Relation Age of Onset  . Lymphoma Sister   . Prostate  cancer Brother   . Breast cancer Sister   . Diabetes Brother   . Diabetes Sister   . Heart disease Brother   . Asthma Son     Social History Social History   Tobacco Use  . Smoking status: Former Smoker    Packs/day: 0.50    Years: 20.00    Pack years: 10.00    Types: Pipe, Cigarettes    Last attempt to quit: 06/21/1958    Years since quitting: 59.7  . Smokeless tobacco: Never Used  Substance Use Topics  . Alcohol use: No    Alcohol/week: 0.0 standard drinks  . Drug use: No     Allergies   Glucophage [metformin hydrochloride]; Zetia [ezetimibe]; Fenofibrate; and Niacin-lovastatin er   Review of Systems Review of Systems All other systems negative except as documented in the HPI. All pertinent positives and negatives as reviewed in the HPI.  Physical Exam Updated Vital Signs BP (!) 173/76 (BP Location: Right Arm)   Pulse 87   Temp 98 F (36.7 C) (Oral)   Resp 18   Ht 5\' 9"  (1.753 m)   Wt 85.4 kg   SpO2 95%   BMI 27.81 kg/m   Physical Exam  Constitutional: He is oriented to person, place, and time. He appears well-developed and well-nourished. No distress.  HENT:  Head: Normocephalic and atraumatic.  Mouth/Throat: Oropharynx is clear and moist.  Eyes: Pupils are equal, round, and reactive to light.  Neck: Normal range of motion. Neck supple.  Cardiovascular: Normal rate, regular rhythm and normal heart sounds. Exam reveals no gallop and no friction rub.  No murmur heard. Pulmonary/Chest: Effort normal. Tachypnea noted. He has decreased breath sounds in the right lower field and the left lower field. He has no wheezes. He has no rhonchi. He has rales in the left middle field.  Abdominal: Soft. Bowel sounds are normal. He exhibits no distension. There is no tenderness.  Neurological: He is alert and oriented to person, place, and time. He exhibits normal muscle tone. Coordination normal.  Skin: Skin is warm and dry. Capillary refill takes less than 2 seconds. No  rash noted. No erythema.  Psychiatric: He has a normal mood and affect. His behavior is normal.  Nursing note and vitals reviewed.    ED Treatments / Results  Labs (all labs ordered are listed, but only abnormal results are displayed) Labs Reviewed  BASIC METABOLIC PANEL -  Abnormal; Notable for the following components:      Result Value   Glucose, Bld 151 (*)    Creatinine, Ser 1.77 (*)    GFR calc non Af Amer 33 (*)    GFR calc Af Amer 39 (*)    All other components within normal limits  CBC - Abnormal; Notable for the following components:   WBC 2.8 (*)    Hemoglobin 10.4 (*)    HCT 37.1 (*)    MCH 24.2 (*)    MCHC 28.0 (*)    RDW 27.7 (*)    Platelets 112 (*)    All other components within normal limits  BRAIN NATRIURETIC PEPTIDE - Abnormal; Notable for the following components:   B Natriuretic Peptide 820.7 (*)    All other components within normal limits  PROTIME-INR  I-STAT TROPONIN, ED    EKG EKG Interpretation  Date/Time:  Sunday February 19 2018 06:25:40 EDT Ventricular Rate:  95 PR Interval:    QRS Duration: 130 QT Interval:  356 QTC Calculation: 448 R Axis:   -42 Text Interpretation:  Sinus tachycardia Ventricular tachycardia, unsustained Nonspecific IVCD with LAD LVH with secondary repolarization abnormality Anterior Q waves, possibly due to LVH No significant change since last tracing Confirmed by Deno Etienne [phone]) on 02/19/2018 8:50:43 AM Also confirmed by Deno Etienne [phone]), editor Lynder Parents [phone])  on 02/19/2018 9:36:54 AM   Radiology Dg Chest 2 View  Result Date: 02/19/2018 CLINICAL DATA:  84 year old male with chest pain. EXAM: CHEST - 2 VIEW COMPARISON:  Chest radiograph dated 01/09/2018 FINDINGS: There is mild eventration of the right hemidiaphragm. Mild perihilar and central streaky densities may represent atelectatic changes or mild vascular congestion. There is a small left pleural effusion. No focal consolidation or pneumothorax. Stable  mild cardiomegaly. No acute osseous pathology. IMPRESSION: Cardiomegaly with probable mild vascular congestion and small left pleural effusion. No focal consolidation. Electronically Signed   By: Anner Crete M.D.   On: 02/19/2018 06:57    Procedures Procedures (including critical care time)  Medications Ordered in ED Medications  acetaminophen (TYLENOL) tablet 1,000 mg (has no administration in time range)  metoprolol succinate (TOPROL-XL) 24 hr tablet 100 mg (has no administration in time range)  loperamide (IMODIUM) capsule 2 mg (has no administration in time range)  clopidogrel (PLAVIX) tablet 75 mg (has no administration in time range)  tamsulosin (FLOMAX) capsule 0.4 mg (has no administration in time range)  traMADol (ULTRAM) tablet 50 mg (has no administration in time range)  multivitamin with minerals tablet 1 tablet (has no administration in time range)  furosemide (LASIX) injection 40 mg (has no administration in time range)  insulin aspart (novoLOG) injection 0-9 Units (has no administration in time range)  heparin injection 5,000 Units (has no administration in time range)  sodium chloride flush (NS) 0.9 % injection 3 mL (has no administration in time range)  sodium chloride flush (NS) 0.9 % injection 3 mL (has no administration in time range)  0.9 %  sodium chloride infusion (has no administration in time range)  insulin glargine (LANTUS) injection 25 Units (has no administration in time range)  furosemide (LASIX) injection 40 mg (40 mg Intravenous Given 02/19/18 1041)     Initial Impression / Assessment and Plan / ED Course  I have reviewed the triage vital signs and the nursing notes.  Pertinent labs & imaging results that were available during my care of the patient were reviewed by me and considered in my medical decision  making (see chart for details).    Will need to be admitted to the hospital for further evaluation and care of his CHF exacerbation.  The patient  has been stable here in the emergency department.  I did speak with the [facility] who will admit the patient for further evaluation.  He was given IV Lasix here in the department as well.  Patient has remained somewhat stable even though he was tachypneic on examination.  Final Clinical Impressions(s) / ED Diagnoses   Final diagnoses:  Shortness of breath  Other congestive heart failure [facility])    ED Discharge Orders    None       Dalia Heading, PA-C 02/19/18 [place], DO 02/20/18 [phone]

## 2018-02-19 NOTE — ED Triage Notes (Signed)
Pt was woken up out of sleep w/ substernal to left sided chest pain.  Denies n/v/d or lightheadedness.

## 2018-02-19 NOTE — ED Notes (Signed)
Taken to xray at this time. 

## 2018-02-20 ENCOUNTER — Observation Stay (HOSPITAL_COMMUNITY): Payer: Medicare Other

## 2018-02-20 ENCOUNTER — Other Ambulatory Visit: Payer: Self-pay

## 2018-02-20 ENCOUNTER — Encounter (HOSPITAL_COMMUNITY): Payer: Self-pay | Admitting: General Practice

## 2018-02-20 DIAGNOSIS — Z7902 Long term (current) use of antithrombotics/antiplatelets: Secondary | ICD-10-CM | POA: Diagnosis not present

## 2018-02-20 DIAGNOSIS — I5021 Acute systolic (congestive) heart failure: Secondary | ICD-10-CM | POA: Diagnosis not present

## 2018-02-20 DIAGNOSIS — Z87891 Personal history of nicotine dependence: Secondary | ICD-10-CM | POA: Diagnosis not present

## 2018-02-20 DIAGNOSIS — E785 Hyperlipidemia, unspecified: Secondary | ICD-10-CM | POA: Diagnosis present

## 2018-02-20 DIAGNOSIS — E1122 Type 2 diabetes mellitus with diabetic chronic kidney disease: Secondary | ICD-10-CM | POA: Diagnosis present

## 2018-02-20 DIAGNOSIS — Z8249 Family history of ischemic heart disease and other diseases of the circulatory system: Secondary | ICD-10-CM | POA: Diagnosis not present

## 2018-02-20 DIAGNOSIS — E559 Vitamin D deficiency, unspecified: Secondary | ICD-10-CM | POA: Diagnosis present

## 2018-02-20 DIAGNOSIS — I2510 Atherosclerotic heart disease of native coronary artery without angina pectoris: Secondary | ICD-10-CM | POA: Diagnosis present

## 2018-02-20 DIAGNOSIS — R0602 Shortness of breath: Secondary | ICD-10-CM | POA: Diagnosis not present

## 2018-02-20 DIAGNOSIS — Z803 Family history of malignant neoplasm of breast: Secondary | ICD-10-CM | POA: Diagnosis not present

## 2018-02-20 DIAGNOSIS — N183 Chronic kidney disease, stage 3 (moderate): Secondary | ICD-10-CM | POA: Diagnosis present

## 2018-02-20 DIAGNOSIS — Z8546 Personal history of malignant neoplasm of prostate: Secondary | ICD-10-CM | POA: Diagnosis not present

## 2018-02-20 DIAGNOSIS — I5043 Acute on chronic combined systolic (congestive) and diastolic (congestive) heart failure: Secondary | ICD-10-CM

## 2018-02-20 DIAGNOSIS — Z8042 Family history of malignant neoplasm of prostate: Secondary | ICD-10-CM | POA: Diagnosis not present

## 2018-02-20 DIAGNOSIS — Z8572 Personal history of non-Hodgkin lymphomas: Secondary | ICD-10-CM | POA: Diagnosis not present

## 2018-02-20 DIAGNOSIS — Z833 Family history of diabetes mellitus: Secondary | ICD-10-CM | POA: Diagnosis not present

## 2018-02-20 DIAGNOSIS — Z86010 Personal history of colonic polyps: Secondary | ICD-10-CM | POA: Diagnosis not present

## 2018-02-20 DIAGNOSIS — I130 Hypertensive heart and chronic kidney disease with heart failure and stage 1 through stage 4 chronic kidney disease, or unspecified chronic kidney disease: Secondary | ICD-10-CM | POA: Diagnosis present

## 2018-02-20 DIAGNOSIS — Z825 Family history of asthma and other chronic lower respiratory diseases: Secondary | ICD-10-CM | POA: Diagnosis not present

## 2018-02-20 DIAGNOSIS — Z794 Long term (current) use of insulin: Secondary | ICD-10-CM | POA: Diagnosis not present

## 2018-02-20 DIAGNOSIS — Z888 Allergy status to other drugs, medicaments and biological substances status: Secondary | ICD-10-CM | POA: Diagnosis not present

## 2018-02-20 DIAGNOSIS — K5790 Diverticulosis of intestine, part unspecified, without perforation or abscess without bleeding: Secondary | ICD-10-CM | POA: Diagnosis present

## 2018-02-20 DIAGNOSIS — Z9861 Coronary angioplasty status: Secondary | ICD-10-CM | POA: Diagnosis not present

## 2018-02-20 DIAGNOSIS — Z807 Family history of other malignant neoplasms of lymphoid, hematopoietic and related tissues: Secondary | ICD-10-CM | POA: Diagnosis not present

## 2018-02-20 DIAGNOSIS — E876 Hypokalemia: Secondary | ICD-10-CM | POA: Diagnosis present

## 2018-02-20 DIAGNOSIS — E1151 Type 2 diabetes mellitus with diabetic peripheral angiopathy without gangrene: Secondary | ICD-10-CM | POA: Diagnosis present

## 2018-02-20 DIAGNOSIS — I340 Nonrheumatic mitral (valve) insufficiency: Secondary | ICD-10-CM | POA: Diagnosis not present

## 2018-02-20 DIAGNOSIS — I361 Nonrheumatic tricuspid (valve) insufficiency: Secondary | ICD-10-CM | POA: Diagnosis not present

## 2018-02-20 LAB — GLUCOSE, CAPILLARY
GLUCOSE-CAPILLARY: 160 mg/dL — AB (ref 70–99)
GLUCOSE-CAPILLARY: 143 mg/dL — AB (ref 70–99)
Glucose-Capillary: 150 mg/dL — ABNORMAL HIGH (ref 70–99)
Glucose-Capillary: 90 mg/dL (ref 70–99)

## 2018-02-20 LAB — ECHOCARDIOGRAM COMPLETE
Height: 69 in
Weight: 2956.8 oz

## 2018-02-20 LAB — BASIC METABOLIC PANEL
ANION GAP: 11 (ref 5–15)
BUN: 20 mg/dL (ref 8–23)
CHLORIDE: 106 mmol/L (ref 98–111)
CO2: 24 mmol/L (ref 22–32)
Calcium: 8.7 mg/dL — ABNORMAL LOW (ref 8.9–10.3)
Creatinine, Ser: 1.85 mg/dL — ABNORMAL HIGH (ref 0.61–1.24)
GFR calc non Af Amer: 32 mL/min — ABNORMAL LOW (ref >60)
GFR, EST AFRICAN AMERICAN: 37 mL/min — AB (ref >60)
Glucose, Bld: 120 mg/dL — ABNORMAL HIGH (ref 70–99)
Potassium: 3.4 mmol/L — ABNORMAL LOW (ref 3.5–5.1)
SODIUM: 141 mmol/L (ref 135–145)

## 2018-02-20 MED ORDER — INSULIN ASPART 100 UNIT/ML ~~LOC~~ SOLN
0.0000 [IU] | Freq: Three times a day (TID) | SUBCUTANEOUS | Status: DC
  Administered 2018-02-20: 2 [IU] via SUBCUTANEOUS
  Administered 2018-02-20: 1 [IU] via SUBCUTANEOUS

## 2018-02-20 MED ORDER — POTASSIUM CHLORIDE CRYS ER 20 MEQ PO TBCR
40.0000 meq | EXTENDED_RELEASE_TABLET | Freq: Once | ORAL | Status: AC
  Administered 2018-02-20: 40 meq via ORAL
  Filled 2018-02-20: qty 2

## 2018-02-20 NOTE — Progress Notes (Signed)
  Echocardiogram 2D Echocardiogram has been performed.  Ricardo Watkins 02/20/2018, 8:42 AM

## 2018-02-20 NOTE — Progress Notes (Signed)
PROGRESS NOTE    Ricardo Watkins  MHD:[id number] DOB: 1931/01/31 DOA: 02/19/2018 PCP: [name], MD   Brief Narrative: Patient is 84 year old male with past medical history of combined congestive heart failure, coronary artery disease, diabetes mellitus, hypertension, hyperlipidemia, CKD stage III who presents to the emergency department with complaints of substernal chest pain, shortness of breath for last 2 weeks.  Patient was admitted for the management of acute on chronic CHF.  Assessment & Plan:   Active Problems:   CHF (congestive heart failure) (HCC)  Acute on chronic combined CHF: Echo finding: Systolic function was moderately to severely reduced. The estimated ejection fraction was in the range of 30% to 35%. There is moderate hypokinesis of the mid-apicalapical myocardium. There is moderate hypokinesis of the inferior myocardium. Doppler  parameters are consistent with abnormal left ventricular relaxation (grade 1 diastolic dysfunction). Echocardiogram done in 8/19 showed ejection fraction of 45 to 50%. Noted to be reduction in the ejection fraction.  Patient has appointment with cardiology on 02/27/2018. Continue diuresis with current dose of Lasix.  Continue metoprolol.  Patient not on ACE inhibitors because of kidney function.  Patient had an output of around 3 L since admission.  Educated patient to limit the water intake to less than 1.5 L a day, less than 2 g of salt a day and daily weight assessment at home. Chest Xray on admission showed cardiomegaly with probable mild vascular congestion and small left pleural effusion.   CKD stage III: His baseline creatinine ranges from 1.7-2.3.  He does not follow with nephrology.  Creatinine slightly elevated since yesterday.  We will continue to monitor.  Chest pain: Atypical chest pain.  Resolved now.  Troponins normal.  EKG did not show any ischemic changes.  Continue Plavix  Hypertension: Currently blood pressure stable.   Continue current medications  History of coronary artery disease: Status post S/P PTCA and angioplasty 2004  Diabetes type 2: Continue lantus and sliding-scale insulin.  Hypokalemia: Supplemented with potassium  DVT prophylaxis: Heparin Code Status: Full Family Communication: Discussed with wife at the bedside Disposition Plan: Likely home tomorrow   Consultants: None  Procedures: None  Antimicrobials: None  Subjective: Patient seen and examined the bedside this morning.  Feels much better today.  Denies any shortness of breath or chest pain.  Hemodynamically stable.  Objective: Vitals:   02/19/18 2032 02/20/18 0020 02/20/18 0517 02/20/18 0856  BP: 140/72 140/69 (!) 154/78 (!) 158/84  Pulse: 79 69 73 77  Resp: 19 19 19    Temp: 98.2 F (36.8 C) 97.8 F (36.6 C) 97.9 F (36.6 C)   TempSrc: Oral Oral Oral   SpO2: 94% 93% 96% 96%  Weight:   83.8 kg   Height:        Intake/Output Summary (Last 24 hours) at 02/20/2018 1147 Last data filed at 02/20/2018 0919 Gross per 24 hour  Intake 726 ml  Output 2185 ml  Net -1459 ml   Filed Weights   02/19/18 0625 02/19/18 1542 02/20/18 0517  Weight: 81.6 kg 85.4 kg 83.8 kg    Examination:  General exam: Appears calm and comfortable ,Not in distress,average built HEENT:PERRL,Oral mucosa moist, Ear/Nose normal on gross exam Respiratory system: Basal crackles bilaterally Cardiovascular system: S1 & S2 heard, RRR. No JVD, murmurs, rubs, gallops or clicks.  1+ pedal edema. Gastrointestinal system: Abdomen is nondistended, soft and nontender. No organomegaly or masses felt. Normal bowel sounds heard. Central nervous system: Alert and oriented. No focal neurological deficits. Extremities:  1+ edema on the lower extremities, no clubbing ,no cyanosis, distal peripheral pulses palpable. Skin: No rashes, lesions or ulcers,no icterus ,no pallor MSK: Normal muscle bulk,tone ,power Psychiatry: Judgement and insight appear normal. Mood &  affect appropriate.     Data Reviewed: I have personally reviewed following labs and imaging studies  CBC: Recent Labs  Lab 02/16/18 1432 02/19/18 0635  WBC 2.6* 2.8*  NEUTROABS 0.5*  --   HGB 10.0* 10.4*  HCT 33.7* 37.1*  MCV 82.6 86.5  PLT 112* 630*   Basic Metabolic Panel: Recent Labs  Lab 02/19/18 0635 02/20/18 0408  NA 141 141  K 3.9 3.4*  CL 110 106  CO2 26 24  GLUCOSE 151* 120*  BUN 23 20  CREATININE 1.77* 1.85*  CALCIUM 9.0 8.7*   GFR: Estimated Creatinine Clearance: 29.2 mL/min (A) (by C-G formula based on SCr of 1.85 mg/dL (H)). Liver Function Tests: No results for input(s): AST, ALT, ALKPHOS, BILITOT, PROT, ALBUMIN in the last 168 hours. No results for input(s): LIPASE, AMYLASE in the last 168 hours. No results for input(s): AMMONIA in the last 168 hours. Coagulation Profile: Recent Labs  Lab 02/19/18 0635  INR 1.13   Cardiac Enzymes: No results for input(s): CKTOTAL, CKMB, CKMBINDEX, TROPONINI in the last 168 hours. BNP (last 3 results) No results for input(s): PROBNP in the last 8760 hours. HbA1C: No results for input(s): HGBA1C in the last 72 hours. CBG: Recent Labs  Lab 02/19/18 1702 02/19/18 2139 02/20/18 0728  GLUCAP 164* 140* 160*   Lipid Profile: No results for input(s): CHOL, HDL, LDLCALC, TRIG, CHOLHDL, LDLDIRECT in the last 72 hours. Thyroid Function Tests: No results for input(s): TSH, T4TOTAL, FREET4, T3FREE, THYROIDAB in the last 72 hours. Anemia Panel: No results for input(s): VITAMINB12, FOLATE, FERRITIN, TIBC, IRON, RETICCTPCT in the last 72 hours. Sepsis Labs: No results for input(s): PROCALCITON, LATICACIDVEN in the last 168 hours.  No results found for this or any previous visit (from the past 240 hour(s)).       Radiology Studies: Dg Chest 2 View  Result Date: 02/19/2018 CLINICAL DATA:  84 year old male with chest pain. EXAM: CHEST - 2 VIEW COMPARISON:  Chest radiograph dated 01/09/2018 FINDINGS: There is mild  eventration of the right hemidiaphragm. Mild perihilar and central streaky densities may represent atelectatic changes or mild vascular congestion. There is a small left pleural effusion. No focal consolidation or pneumothorax. Stable mild cardiomegaly. No acute osseous pathology. IMPRESSION: Cardiomegaly with probable mild vascular congestion and small left pleural effusion. No focal consolidation. Electronically Signed   By: Anner Crete M.D.   On: 02/19/2018 06:57        Scheduled Meds: . clopidogrel  75 mg Oral Daily  . furosemide  40 mg Intravenous Daily  . heparin  5,000 Units Subcutaneous Q8H  . insulin aspart  0-9 Units Subcutaneous TID WC  . insulin glargine  25 Units Subcutaneous Daily  . metoprolol succinate  100 mg Oral Daily  . multivitamin with minerals  1 tablet Oral QHS  . sodium chloride flush  3 mL Intravenous Q12H  . tamsulosin  0.4 mg Oral Daily   Continuous Infusions: . sodium chloride       LOS: 0 days    Time spent: More than 50% of that time was spent in counseling and/or coordination of care.      Shelly Coss, MD [facility] Pager [phone]  If 7PM-7AM, please contact night-coverage [email or web address] Password [facility] 02/20/2018, 11:47 AM

## 2018-02-21 LAB — BASIC METABOLIC PANEL
Anion gap: 7 (ref 5–15)
BUN: 20 mg/dL (ref 8–23)
CALCIUM: 8.8 mg/dL — AB (ref 8.9–10.3)
CO2: 28 mmol/L (ref 22–32)
CREATININE: 1.75 mg/dL — AB (ref 0.61–1.24)
Chloride: 103 mmol/L (ref 98–111)
GFR calc Af Amer: 39 mL/min — ABNORMAL LOW (ref >60)
GFR, EST NON AFRICAN AMERICAN: 34 mL/min — AB (ref >60)
Glucose, Bld: 106 mg/dL — ABNORMAL HIGH (ref 70–99)
Potassium: 3.9 mmol/L (ref 3.5–5.1)
SODIUM: 138 mmol/L (ref 135–145)

## 2018-02-21 LAB — GLUCOSE, CAPILLARY
Glucose-Capillary: 105 mg/dL — ABNORMAL HIGH (ref 70–99)
Glucose-Capillary: 226 mg/dL — ABNORMAL HIGH (ref 70–99)

## 2018-02-21 MED ORDER — POTASSIUM CHLORIDE ER 20 MEQ PO TBCR: 20.0000 meq | EXTENDED_RELEASE_TABLET | Freq: Every day | ORAL | 0 refills | Status: DC

## 2018-02-21 NOTE — Plan of Care (Signed)

## 2018-02-21 NOTE — Discharge Summary (Signed)
Physician Discharge Summary  Ricardo Watkins:[id number] DOB: 01/01/1931 DOA: 02/19/2018  PCP: [name], MD  Admit date: 02/19/2018 Discharge date: 02/21/2018  Admitted From: Home Disposition:  Home  Discharge Condition:Stable CODE STATUS:FULL Diet recommendation: Heart Healthy  Brief/Interim Summary: Patient is 84 year old male with past medical history of combined congestive heart failure, coronary artery disease, diabetes mellitus, hypertension, hyperlipidemia, CKD stage III who presents to the emergency department with complaints of substernal chest pain, shortness of breath for last 2 weeks.  Patient was admitted for the management of acute on chronic CHF.  Patient was started on IV Lasix.  His respiratory status significantly improved after diuresis.He had an output of almost  4 L after admission. Patient is stable for discharge home today.  Following problems were addressed during his hospitalization:  Acute on chronic combined CHF: Echo finding: Systolicfunction was moderately to severely reduced. The estimated ejection fraction was in the range of 30% to 35%. There is moderate hypokinesis of the mid-apicalapical myocardium. There is moderate hypokinesis of the inferior myocardium. Doppler  parameters are consistent with abnormal left ventricular relaxation (grade 1 diastolic dysfunction). Echocardiogram done in 8/19 showed ejection fraction of 45 to 50%. Noted to be reduction in the ejection fraction.  Patient has appointment with cardiology on 02/27/2018. Continue diuresis with current dose of Lasix.  Continue metoprolol.  Patient not on ACE inhibitors because of kidney function.  Patient had an output of around 4 L since admission.  Educated patient to limit the water intake to less than 1.5 L a day, less than 2 g of salt a day and daily weight assessment at home. Chest Xray on admission showed cardiomegaly with probable mild vascular congestion and small left pleural  effusion.  CKD stage III: His baseline creatinine ranges from 1.7-2.3.  He does not follow with nephrology.  Creatinine improved since yesterday.  Check BMP in a week.  Chest pain: Atypical chest pain.  Resolved now.  Troponins normal.  EKG did not show any ischemic changes.  Continue Plavix  Hypertension: Currently blood pressure stable.  Continue current medications  History of coronary artery disease: Status post S/P PTCA and angioplasty 2004  Diabetes type 2: Continue lantus and sliding-scale insulin.  Hypokalemia: Continue supplementation.    Discharge Diagnoses:  Active Problems:   CHF (congestive heart failure) [facility])    Discharge Instructions  Discharge Instructions    Diet - low sodium heart healthy   Complete by:  As directed    Discharge instructions   Complete by:  As directed    1) Follow up with cardiology. 2)Follow up with your PCP in a week.  Do a BMP test to check your kidney function during the follow-up. 3)Continue your home medications. 4)Restrict fluid intake to less than 1.5 L a day and salt intake to less than 2 g a day.  Monitor your weight at home   Increase activity slowly   Complete by:  As directed      Allergies as of 02/21/2018      Reactions   Glucophage [metformin Hydrochloride] Other (See Comments)   Chest pain   Zetia [ezetimibe] Other (See Comments)   weakness   Fenofibrate Rash   Niacin-lovastatin Er Rash      Medication List    TAKE these medications   acetaminophen 500 MG tablet Commonly known as:  TYLENOL Take 1,000 mg by mouth every 6 (six) hours as needed for mild pain, moderate pain or headache.   clopidogrel 75 MG  tablet Commonly known as:  PLAVIX TAKE 1 TABLET BY MOUTH ONCE A DAY WITH BREAKFAST What changed:  See the new instructions.   furosemide 40 MG tablet Commonly known as:  LASIX Take 1 tablet (40 mg total) by mouth daily. What changed:    how much to take  when to take this  additional  instructions   gabapentin 100 MG capsule Commonly known as:  NEURONTIN Take 1 capsule (100 mg total) by mouth at bedtime.   Insulin Glargine 100 UNIT/ML Solostar Pen Commonly known as:  LANTUS INJECT 25 UNITS EVERY NIGHT AT BEDTIME What changed:    how much to take  how to take this  when to take this  additional instructions   levocetirizine 5 MG tablet Commonly known as:  XYZAL Take 1 tablet (5 mg total) by mouth every evening.   loperamide 2 MG capsule Commonly known as:  IMODIUM Take 2 mg by mouth as needed for diarrhea or loose stools.   meclizine 12.5 MG tablet Commonly known as:  ANTIVERT Take 1 tablet (12.5 mg total) by mouth 3 (three) times daily as needed for dizziness.   metoprolol succinate 100 MG 24 hr tablet Commonly known as:  TOPROL-XL TAKE 1 TABLET BY MOUTH ONCE DAILY   multivitamin with minerals Tabs tablet Take 1 tablet by mouth at bedtime.   NON FORMULARY Inject 1 Dose into the vein once a week. Platelet booster   OVER THE COUNTER MEDICATION Place 1 drop into both eyes daily as needed (dry eyes). Over the counter lubricating eye drop   Potassium Chloride ER 20 MEQ Tbcr Take 20 mEq by mouth daily for 14 days.   tamsulosin 0.4 MG Caps capsule Commonly known as:  FLOMAX TAKE 1 CAPSULE BY MOUTH ONCE DAILY   traMADol 50 MG tablet Commonly known as:  ULTRAM Take 1 tablet (50 mg total) by mouth every 6 (six) hours as needed for moderate pain.      Follow-up Information    [name], MD. Schedule an appointment as soon as possible for a visit in 1 week(s).   Specialty:  Family Medicine Contact information: [address] [place] [address] [phone]          Allergies  Allergen Reactions  . Glucophage [Metformin Hydrochloride] Other (See Comments)    Chest pain  . Zetia [Ezetimibe] Other (See Comments)    weakness  . Fenofibrate Rash  . Niacin-Lovastatin Er Rash     Consultations: None  Procedures/Studies: Dg Chest 2 View  Result Date: 02/19/2018 CLINICAL DATA:  84 year old male with chest pain. EXAM: CHEST - 2 VIEW COMPARISON:  Chest radiograph dated 01/09/2018 FINDINGS: There is mild eventration of the right hemidiaphragm. Mild perihilar and central streaky densities may represent atelectatic changes or mild vascular congestion. There is a small left pleural effusion. No focal consolidation or pneumothorax. Stable mild cardiomegaly. No acute osseous pathology. IMPRESSION: Cardiomegaly with probable mild vascular congestion and small left pleural effusion. No focal consolidation. Electronically Signed   By: Anner Crete M.D.   On: 02/19/2018 06:57       Subjective: Patient seen and examined the bedside this morning.  Remains comfortable.  Saturating fine on room air.  Denies any shortness of breath or chest pain.  Hemodynamically stable for discharge home today  Discharge Exam: Vitals:   02/21/18 0920 02/21/18 0926  BP: (!) 145/80 (!) 129/58  Pulse: 82 84  Resp:  17  Temp:  (!) 97.5 F (36.4  C)  SpO2:  95%   Vitals:   02/21/18 0008 02/21/18 0420 02/21/18 0920 02/21/18 0926  BP: 137/75 (!) 174/81 (!) 145/80 (!) 129/58  Pulse: 71 80 82 84  Resp: 18 18  17   Temp: 98 F (36.7 C) 98.3 F (36.8 C)  (!) 97.5 F (36.4 C)  TempSrc: Oral Oral  Oral  SpO2: 95% 95%  95%  Weight:  83.7 kg    Height:        General: Pt is alert, awake, not in acute distress Cardiovascular: RRR, S1/S2 +, no rubs, no gallops Respiratory: Mild bibasilar crackles Abdominal: Soft, NT, ND, bowel sounds + Extremities: no edema, no cyanosis    The results of significant diagnostics from this hospitalization (including imaging, microbiology, ancillary and laboratory) are listed below for reference.     Microbiology: No results found for this or any previous visit (from the past 240 hour(s)).   Labs: BNP (last 3 results) Recent Labs    11/08/17 1226  01/09/18 1238 02/19/18 0635  BNP 266* 642* 564.3*   Basic Metabolic Panel: Recent Labs  Lab 02/19/18 0635 02/20/18 0408 02/21/18 0624  NA 141 141 138  K 3.9 3.4* 3.9  CL 110 106 103  CO2 26 24 28   GLUCOSE 151* 120* 106*  BUN 23 20 20   CREATININE 1.77* 1.85* 1.75*  CALCIUM 9.0 8.7* 8.8*   Liver Function Tests: No results for input(s): AST, ALT, ALKPHOS, BILITOT, PROT, ALBUMIN in the last 168 hours. No results for input(s): LIPASE, AMYLASE in the last 168 hours. No results for input(s): AMMONIA in the last 168 hours. CBC: Recent Labs  Lab 02/16/18 1432 02/19/18 0635  WBC 2.6* 2.8*  NEUTROABS 0.5*  --   HGB 10.0* 10.4*  HCT 33.7* 37.1*  MCV 82.6 86.5  PLT 112* 112*   Cardiac Enzymes: No results for input(s): CKTOTAL, CKMB, CKMBINDEX, TROPONINI in the last 168 hours. BNP: Invalid input(s): POCBNP CBG: Recent Labs  Lab 02/20/18 0728 02/20/18 1231 02/20/18 1655 02/20/18 2053 02/21/18 0804  GLUCAP 160* 150* 90 143* 105*   D-Dimer No results for input(s): DDIMER in the last 72 hours. Hgb A1c No results for input(s): HGBA1C in the last 72 hours. Lipid Profile No results for input(s): CHOL, HDL, LDLCALC, TRIG, CHOLHDL, LDLDIRECT in the last 72 hours. Thyroid function studies No results for input(s): TSH, T4TOTAL, T3FREE, THYROIDAB in the last 72 hours.  Invalid input(s): FREET3 Anemia work up No results for input(s): VITAMINB12, FOLATE, FERRITIN, TIBC, IRON, RETICCTPCT in the last 72 hours. Urinalysis    Component Value Date/Time   COLORURINE YELLOW 01/03/2018 1616   APPEARANCEUR CLEAR 01/03/2018 1616   LABSPEC 1.020 01/03/2018 1616   PHURINE 5.5 01/03/2018 1616   GLUCOSEU NEGATIVE 01/03/2018 1616   HGBUR TRACE (A) 01/03/2018 1616   BILIRUBINUR NEGATIVE 06/02/2017 1245   KETONESUR NEGATIVE 01/03/2018 1616   PROTEINUR 2+ (A) 01/03/2018 1616   NITRITE NEGATIVE 01/03/2018 1616   LEUKOCYTESUR NEGATIVE 01/03/2018 1616   Sepsis Labs Invalid input(s):  PROCALCITONIN,  WBC,  LACTICIDVEN Microbiology No results found for this or any previous visit (from the past 240 hour(s)).  Please note: You were cared for by a hospitalist during your hospital stay. Once you are discharged, your primary care physician will handle any further medical issues. Please note that NO REFILLS for any discharge medications will be authorized once you are discharged, as it is imperative that you return to your primary care physician (or establish a relationship with a primary  care physician if you do not have one) for your post hospital discharge needs so that they can reassess your need for medications and monitor your lab values.    Time coordinating discharge: 40 minutes  SIGNED:   Shelly Coss, MD  [facility] 02/21/2018, 10:50 AM Pager [phone]  If 7PM-7AM, please contact night-coverage [email or web address] Password [email or web address]

## 2018-02-21 NOTE — Care Management Note (Signed)
Case Management Note  Patient Details  Name: SHELBY ANDERLE MRN: [id number] Date of Birth: 02/22/1931  Subjective/Objective:   CHF              Action/Plan: Patient lives at home with spouse; PCP: [name], MD; has private insurance with Medicare; pharmacy of choice is [facility]; his daughter picks up his medication, he can have his medication delivered to his home if he choose to; DME - cane; he continues to drive short distances, has scales at home and knows to weigh himself daily; his wife is decreasing the amount of salt in their diet; patient could benefit from [facility] services, but he is refusing all [place] at this time; Expected Discharge Date:  02/21/18               Expected Discharge Plan:   Home  In-House Referral:   [facility]  HH Arranged:   Refused  Status of Service:   Completed  Sherrilyn Rist [phone] 02/21/2018, 10:56 AM

## 2018-02-21 NOTE — Progress Notes (Signed)
Pt discharge instructions reviewed with pt, pt wife, and pt daughter. Pt and family verbalize understanding and state they have no questions. Pt belongings with pt. Pt is not in distress. Pt discharged via wheelchair. Pt's daughter is driving him home.

## 2018-02-22 NOTE — Progress Notes (Signed)
Cardiology Office Note   Date:  02/27/2018   ID:  Ricardo Watkins, DOB [birth year], MRN [id number]  PCP:  [name], MD  Cardiologist: [facility]  Chief Complaint  Patient presents with  . Follow-up   History of Present Illness: Ricardo Watkins is a 84 y.o. male who presents for ongoing assessment and management of CAD, most recent intervention by [name] in 2004 with PTCA and angioplasty of the ramus intermedius. Other hx includes Non-Hodgkin's lymphoma, with Pleur-X catheters secondary to chronic non-malignant pleural effusions. (He is followed by oncology); IDDM,hyperlipidemia, carotid artery stenosis, with stable LICA stemsis, and hypertension, mild AS due for repeat echo. He was last seen by cardiology on 12/10/2016, and was clinically stable.   Unfortunately, he was admitted to the hospital on 02/19/2018 for chest pain and found to have decompensated mixed CHF.  He was started on IV lasix 4 liter diureses. Repeat echocardiogram demonstrated significantly reduced EF of 30-35% with moderate hypokinesis of the mid-apical myocardium. Moderate hypokinesis of the inferior myocardium with Grade I diastolic dysfunction. He was placed on lasix 40 mg daily and a 1.4 liter fluid restriction. He was not placed on ACE or ARB due to CKD Stage III. Creatinine 1.75 on discharge. He was anemic with Hgb of 9.6.   He comes today with complaints of overall weakness and fatigue. He denies near syncope, or frequent palpitations. He has no further chest pain, but continues to have mild DOE. He is not happy that he has to slow down now due to lack of energy. He is having some trouble adhering to a low sodium diet. His wife is struggling to find foods that he will eat. She continues to add some salt to his foods.   Past Medical History:  Diagnosis Date  . Adenomatous colon polyp   . CAD (coronary artery disease)    30% LAD Stenosis, 70% ramus intermedius stenosis, treated with PTCA and angioplasty by Dr Albertine Patricia  2004  . Cataract    right eye  . CHF (congestive heart failure) ([place])   . Cough, persistent 11/04/2015  . Deficiency anemia 04/19/2014  . Diverticulosis   . DM (diabetes mellitus) ([place])   . DVT (deep venous thrombosis) ([place])    secondary to surgery  . Dyslipidemia   . Dyspnea   . Hyperlipidemia   . Hypertension   . Inguinal hernia    right  . 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 Microcytic anemia 07/07/2015  . 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 Non Hodgkin's lymphoma ([place])   . Pleural effusion, left 11/04/2015  . Prostate CA ([place]) 09/10/2011   Gleason 3+3 R, 3+4 L lobe May 2007 Rx Radioactive seed implants Dr [name]  . PVD (peripheral vascular disease) ([place])    rt renal artery stent  . Renal insufficiency   . Thrombocytopenia ([place])   . Thrombotic stroke ([place]) 09/10/2011   January 18, 2011 infarct genu & post limb R internal capsule - acute; previous lacunar infarcts/extensive white matter dis  . Thyroid nodule    left lower lobe (annual monitoring).  . Vitamin D deficiency     Past Surgical History:  Procedure Laterality Date  . APPENDECTOMY     patient ?  Marland Kitchen CARDIAC CATHETERIZATION    . CHEST TUBE INSERTION Left 02/10/2016   Procedure: INSERTION PLEURAL DRAINAGE CATHETER;  Surgeon: [name], MD;  Location: [facility];  Service: Thoracic;  Laterality: Left;  .  CHEST TUBE INSERTION Left 04/08/2016   Procedure: CHEST TUBE INSERTION;  Surgeon: [name], MD;  Location: [facility];  Service: Thoracic;  Laterality: Left;  . COLONOSCOPY    . CORONARY ANGIOPLASTY    . CYSTOURETHROSCOPY     ROBOTIC ARM NUCLETRON SEED IMPLANTATION OF PROSTATE  . EXPLORATORY LAPAROTOMY     For evaluation of lymphoma  . REMOVAL OF PLEURAL DRAINAGE CATHETER Left 04/08/2016   Procedure: REMOVAL OF PLEURAL DRAINAGE CATHETER;  Surgeon: [name], MD;  Location: [facility] OR;  Service: Thoracic;  Laterality: Left;     Current Outpatient Medications    Medication Sig Dispense Refill  . acetaminophen (TYLENOL) 500 MG tablet Take 1,000 mg by mouth every 6 (six) hours as needed for mild pain, moderate pain or headache.     . clopidogrel (PLAVIX) 75 MG tablet TAKE 1 TABLET BY MOUTH ONCE A DAY WITH BREAKFAST (Patient taking differently: Take 75 mg by mouth daily. ) 90 tablet 3  . furosemide (LASIX) 40 MG tablet Take 40mg  daily by mouth. Except excess swelling or weight gain take an additional 40mg  daily 100 tablet 3  . gabapentin (NEURONTIN) 100 MG capsule Take 1 capsule (100 mg total) by mouth at bedtime. 30 capsule 3  . Insulin Glargine (LANTUS SOLOSTAR) 100 UNIT/ML Solostar Pen INJECT 25 UNITS EVERY NIGHT AT BEDTIME (Patient taking differently: Inject 25 Units into the skin every morning. ) 5 pen 3  . levocetirizine (XYZAL) 5 MG tablet Take 1 tablet (5 mg total) by mouth every evening. 30 tablet 0  . loperamide (IMODIUM) 2 MG capsule Take 2 mg by mouth as needed for diarrhea or loose stools.    . meclizine (ANTIVERT) 12.5 MG tablet Take 1 tablet (12.5 mg total) by mouth 3 (three) times daily as needed for dizziness. 30 tablet 0  . metoprolol succinate (TOPROL-XL) 100 MG 24 hr tablet TAKE 1 TABLET BY MOUTH ONCE DAILY (Patient taking differently: Take 100 mg by mouth daily. ) 90 tablet 3  . Multiple Vitamin (MULTIVITAMIN WITH MINERALS) TABS tablet Take 1 tablet by mouth at bedtime.    . NON FORMULARY Inject 1 Dose into the vein once a week. Platelet booster    . OVER THE COUNTER MEDICATION Place 1 drop into both eyes daily as needed (dry eyes). Over the counter lubricating eye drop    . potassium chloride 20 MEQ TBCR Take 20 mEq by mouth daily for 14 days. 14 tablet 0  . tamsulosin (FLOMAX) 0.4 MG CAPS capsule TAKE 1 CAPSULE BY MOUTH ONCE DAILY (Patient taking differently: Take 0.4 mg by mouth daily. ) 90 capsule 3  . traMADol (ULTRAM) 50 MG tablet Take 1 tablet (50 mg total) by mouth every 6 (six) hours as needed for moderate pain. 60 tablet 0  .  hydrochlorothiazide (MICROZIDE) 12.5 MG capsule Take 1 capsule (12.5 mg total) by mouth 2 (two) times daily. 180 capsule 3   Current Facility-Administered Medications  Medication Dose Route Frequency Provider Last Rate Last Dose  . cyanocobalamin ((VITAMIN B-12)) injection 1,000 mcg  1,000 mcg Subcutaneous Q30 days [name], MD   1,000 mcg at 02/16/18 3557   Facility-Administered Medications Ordered in Other Visits  Medication Dose Route Frequency Provider Last Rate Last Dose  . romiPLOStim (NPLATE) injection 170 mcg  170 mcg Subcutaneous Weekly Heath Lark, MD   170 mcg at 11/25/16 1345    Allergies:   Glucophage [metformin hydrochloride]; Zetia [ezetimibe]; Fenofibrate; and Niacin-lovastatin er    Social  History:  The patient  reports that he quit smoking about 59 years ago. His smoking use included pipe and cigarettes. He has a 10.00 pack-year smoking history. He has never used smokeless tobacco. He reports that he does not drink alcohol or use drugs.   Family History:  The patient's family history includes Asthma in his son; Breast cancer in his sister; Diabetes in his brother and sister; Heart disease in his brother; Lymphoma in his sister; Prostate cancer in his brother.    ROS: All other systems are reviewed and negative. Unless otherwise mentioned in H&P    PHYSICAL EXAM: VS:  BP 140/60   Pulse 69   Ht 5\' 7"  (1.702 m)   Wt 188 lb 6.4 oz (85.5 kg)   BMI 29.51 kg/m  , BMI Body mass index is 29.51 kg/m. GEN: Well nourished, well developed, in no acute distress  HEENT: normal  Neck: no JVD, left high pitched carotid bruits, or masses Cardiac: SEG;3/1 systolic murmurs, rubs, or gallops, mild pitting edema  Respiratory:  clear to auscultation bilaterally, normal work of breathing GI: soft, nontender, nondistended, + BS MS: no deformity or atrophy  Skin: warm and dry, no rash Neuro:  Strength and sensation are intact Psych: euthymic mood, full affect   EKG: Not  completed this office visit.   Recent Labs: 06/02/2017: TSH 1.992 11/04/2017: ALT 16 02/19/2018: B Natriuretic Peptide 820.7 02/21/2018: BUN 20; Creatinine, Ser 1.75; Potassium 3.9; Sodium 138 02/23/2018: Hemoglobin 9.6; Platelets 89    Lipid Panel    Component Value Date/Time   CHOL 102 12/15/2016 1133   TRIG 220 (H) 12/15/2016 1133   HDL 26 (L) 12/15/2016 1133   CHOLHDL 3.9 12/15/2016 1133   VLDL 44 (H) 12/15/2016 1133   LDLCALC 32 12/15/2016 1133      Wt Readings from Last 3 Encounters:  02/27/18 188 lb 6.4 oz (85.5 kg)  02/21/18 184 lb 8 oz (83.7 kg)  01/27/18 184 lb 6.4 oz (83.6 kg)      Other studies Reviewed: Echocardiogram March 05, 2018  Left ventricle: The cavity size was mildly dilated. Systolic   function was moderately to severely reduced. The estimated   ejection fraction was in the range of 30% to 35%. There is   moderate hypokinesis of the mid-apicalapical myocardium. There is   moderate hypokinesis of the inferior myocardium. Doppler   parameters are consistent with abnormal left ventricular   relaxation (grade 1 diastolic dysfunction). - Aortic valve: Left coronary cusp mobility was moderately   restricted. Transvalvular velocity was increased, due to   stenosis. There was moderate to severe stenosis accounting for   poor systolic function. Valve area (VTI): 2 cm^2. Valve area   (Vmax): 2.12 cm^2. Valve area (Vmean): 1.96 cm^2. - Mitral valve: There was moderate regurgitation. - Left atrium: The atrium was moderately dilated. - Right atrium: The atrium was moderately dilated. - Tricuspid valve: There was moderate regurgitation. - Pulmonary arteries: Systolic pressure was mildly increased.  ASSESSMENT AND PLAN:  1.  Chronic Mixed CHF: Recent admission for decompensated CHF. He had echo revealing worsening EF of 30%-35%. His HR and BP initially are not well controlled but I rechecked in the exam room and BP did come down to 134/68. HR 70. This is still not  optimal. I will therefore add hydralazine 12.5 mg BID for better BP control as he is not a candidate for ACE/ARB with CKD. I will recheck BMET and see him again in 2 weeks. May need to increase  metoprolol or change to carvedilol next visit. Due to his overall weakness and fatigue do not want to make too many big changes in medications.  I had a lengthy conversation with the patient, his wife and his daughter about weighing daily, no salt, fluid restrictions. He is given written instructions on low sodium diet.   2. CAD PTCA and angioplasty of the ramus. Continue BB and secondary prevention.   3. Carotid artery stenosis: High pitched left carotid bruit. Most recent carotid ultrasound in 2018 demonstrated left ICA 60-79% stenosis. He continues on Plavix.   4. CKD Stage III: Followed by PCP. Unable to use ACE or ARB. Follow labs.   5. Non-Hodgkin's Lymphoma: Followed by Oncology    Current medicines are reviewed at length with the patient today.    Labs/ tests ordered today include: BMET   Phill Myron. West Pugh, ANP, AACC   02/27/2018 10:59 AM    [place]. [address] [place], [place] [address] Phone: [phone]; Fax: [phone]

## 2018-02-23 ENCOUNTER — Inpatient Hospital Stay: Payer: Medicare Other

## 2018-02-23 ENCOUNTER — Inpatient Hospital Stay: Payer: Medicare Other | Attending: Hematology and Oncology

## 2018-02-23 VITALS — BP 136/61 | HR 72 | Temp 98.1°F | Resp 18

## 2018-02-23 DIAGNOSIS — N183 Chronic kidney disease, stage 3 unspecified: Secondary | ICD-10-CM

## 2018-02-23 DIAGNOSIS — Z23 Encounter for immunization: Secondary | ICD-10-CM | POA: Insufficient documentation

## 2018-02-23 DIAGNOSIS — D696 Thrombocytopenia, unspecified: Secondary | ICD-10-CM

## 2018-02-23 DIAGNOSIS — D46C Myelodysplastic syndrome with isolated del(5q) chromosomal abnormality: Secondary | ICD-10-CM | POA: Diagnosis not present

## 2018-02-23 DIAGNOSIS — D693 Immune thrombocytopenic purpura: Secondary | ICD-10-CM | POA: Diagnosis not present

## 2018-02-23 DIAGNOSIS — J9601 Acute respiratory failure with hypoxia: Secondary | ICD-10-CM

## 2018-02-23 DIAGNOSIS — D708 Other neutropenia: Secondary | ICD-10-CM | POA: Insufficient documentation

## 2018-02-23 DIAGNOSIS — D539 Nutritional anemia, unspecified: Secondary | ICD-10-CM

## 2018-02-23 DIAGNOSIS — D631 Anemia in chronic kidney disease: Secondary | ICD-10-CM

## 2018-02-23 LAB — CBC WITH DIFFERENTIAL/PLATELET
BASOS PCT: 0 %
Basophils Absolute: 0 10*3/uL (ref 0.0–0.1)
EOS PCT: 0 %
Eosinophils Absolute: 0 10*3/uL (ref 0.0–0.5)
HCT: 31.4 % — ABNORMAL LOW (ref 38.4–49.9)
Hemoglobin: 9.6 g/dL — ABNORMAL LOW (ref 13.0–17.1)
Lymphocytes Relative: 78 %
Lymphs Abs: 2 10*3/uL (ref 0.9–3.3)
MCH: 24.5 pg — ABNORMAL LOW (ref 27.2–33.4)
MCHC: 30.7 g/dL — ABNORMAL LOW (ref 32.0–36.0)
MCV: 79.9 fL (ref 79.3–98.0)
MONO ABS: 0.3 10*3/uL (ref 0.1–0.9)
Monocytes Relative: 11 %
NEUTROS ABS: 0.3 10*3/uL — AB (ref 1.5–6.5)
Neutrophils Relative %: 11 %
PLATELETS: 89 10*3/uL — AB (ref 140–400)
RBC: 3.93 MIL/uL — ABNORMAL LOW (ref 4.20–5.82)
RDW: 30 % — AB (ref 11.0–14.6)
WBC: 2.5 10*3/uL — AB (ref 4.0–10.3)

## 2018-02-23 MED ORDER — ROMIPLOSTIM INJECTION 500 MCG
684.0000 ug | SUBCUTANEOUS | Status: DC
  Administered 2018-02-23: 684 ug via SUBCUTANEOUS
  Filled 2018-02-23: qty 1

## 2018-02-23 NOTE — Progress Notes (Signed)
Pt. Tolerated injection well, Pt's ANC was 0.3 neutropenic precautions discussed, Pt. Verbalized understanding, Left clinic alert, oriented,and ambulatory with cane.

## 2018-02-23 NOTE — Patient Instructions (Signed)
Romiplostim injection What is this medicine? ROMIPLOSTIM (roe mi PLOE stim) helps your body make more platelets. This medicine is used to treat low platelets caused by chronic idiopathic thrombocytopenic purpura (ITP). This medicine may be used for other purposes; ask your health care provider or pharmacist if you have questions. COMMON BRAND NAME(S): Nplate What should I tell my health care provider before I take this medicine? They need to know if you have any of these conditions: -cancer or myelodysplastic syndrome -low blood counts, like low white cell, platelet, or red cell counts -take medicines that treat or prevent blood clots -an unusual or allergic reaction to romiplostim, mannitol, other medicines, foods, dyes, or preservatives -pregnant or trying to get pregnant -breast-feeding How should I use this medicine? This medicine is for injection under the skin. It is given by a health care professional in a hospital or clinic setting. A special MedGuide will be given to you before your injection. Read this information carefully each time. Talk to your pediatrician regarding the use of this medicine in children. Special care may be needed. Overdosage: If you think you have taken too much of this medicine contact a poison control center or emergency room at once. NOTE: This medicine is only for you. Do not share this medicine with others. What if I miss a dose? It is important not to miss your dose. Call your doctor or health care professional if you are unable to keep an appointment. What may interact with this medicine? Interactions are not expected. This list may not describe all possible interactions. Give your health care provider a list of all the medicines, herbs, non-prescription drugs, or dietary supplements you use. Also tell them if you smoke, drink alcohol, or use illegal drugs. Some items may interact with your medicine. What should I watch for while using this  medicine? Your condition will be monitored carefully while you are receiving this medicine. Visit your prescriber or health care professional for regular checks on your progress and for the needed blood tests. It is important to keep all appointments. What side effects may I notice from receiving this medicine? Side effects that you should report to your doctor or health care professional as soon as possible: -allergic reactions like skin rash, itching or hives, swelling of the face, lips, or tongue -shortness of breath, chest pain, swelling in a leg -unusual bleeding or bruising Side effects that usually do not require medical attention (report to your doctor or health care professional if they continue or are bothersome): -dizziness -headache -muscle aches -pain in arms and legs -stomach pain -trouble sleeping This list may not describe all possible side effects. Call your doctor for medical advice about side effects. You may report side effects to FDA at 1-800-FDA-1088. Where should I keep my medicine? This drug is given in a hospital or clinic and will not be stored at home. NOTE: This sheet is a summary. It may not cover all possible information. If you have questions about this medicine, talk to your doctor, pharmacist, or health care provider.  2018 Elsevier/Gold Standard (2008-02-05 15:13:04)  

## 2018-02-27 ENCOUNTER — Ambulatory Visit (INDEPENDENT_AMBULATORY_CARE_PROVIDER_SITE_OTHER): Payer: Medicare Other | Admitting: Adult Health

## 2018-02-27 ENCOUNTER — Encounter: Payer: Self-pay | Admitting: Adult Health

## 2018-02-27 VITALS — BP 140/60 | HR 69 | Ht 67.0 in | Wt 188.4 lb

## 2018-02-27 DIAGNOSIS — I2510 Atherosclerotic heart disease of native coronary artery without angina pectoris: Secondary | ICD-10-CM

## 2018-02-27 DIAGNOSIS — I5042 Chronic combined systolic (congestive) and diastolic (congestive) heart failure: Secondary | ICD-10-CM

## 2018-02-27 DIAGNOSIS — N183 Chronic kidney disease, stage 3 unspecified: Secondary | ICD-10-CM

## 2018-02-27 DIAGNOSIS — I6522 Occlusion and stenosis of left carotid artery: Secondary | ICD-10-CM

## 2018-02-27 MED ORDER — HYDROCHLOROTHIAZIDE 12.5 MG PO CAPS: 12.5000 mg | ORAL_CAPSULE | Freq: Two times a day (BID) | ORAL | 3 refills | Status: DC

## 2018-02-27 MED ORDER — FUROSEMIDE 40 MG PO TABS: ORAL_TABLET | ORAL | 3 refills | Status: DC

## 2018-02-27 NOTE — Patient Instructions (Signed)
Medication Instructions:  Your physician has recommended you make the following change in your medication:  START HCTZ 12.5mg  twice daily. An Rx has been sent to your pharmacy  Lasix has been refilled today  Labwork: Your physician recommends that you return for lab work in: 2 weeks ([facility])   Testing/Procedures: None ordered  Follow-Up: Your physician recommends that you schedule a follow-up appointment in: 2 weeks with Jory Sims, NP  Your physician recommends that you schedule a follow-up appointment in: 3 months with Dr.Hochrein    Any Other Special Instructions Will Be Listed Below (If Applicable). Your physician recommends that you weigh, daily, at the same time every day, and in the same amount of clothing. Please record your daily weights and bring it to your next appointment.      If you need a refill on your cardiac medications before your next appointment, please call your pharmacy.  Low-Sodium Eating Plan Sodium, which is an element that makes up salt, helps you maintain a healthy balance of fluids in your body. Too much sodium can increase your blood pressure and cause fluid and waste to be held in your body. Your health care provider or dietitian may recommend following this plan if you have high blood pressure (hypertension), kidney disease, liver disease, or heart failure. Eating less sodium can help lower your blood pressure, reduce swelling, and protect your heart, liver, and kidneys. What are tips for following this plan? General guidelines  Most people on this plan should limit their sodium intake to 1,500-2,000 mg (milligrams) of sodium each day. Reading food labels  The Nutrition Facts label lists the amount of sodium in one serving of the food. If you eat more than one serving, you must multiply the listed amount of sodium by the number of servings.  Choose foods with less than 140 mg of sodium per serving.  Avoid foods with 300 mg of sodium or  more per serving. Shopping  Look for lower-sodium products, often labeled as "low-sodium" or "no salt added."  Always check the sodium content even if foods are labeled as "unsalted" or "no salt added".  Buy fresh foods. ? Avoid canned foods and premade or frozen meals. ? Avoid canned, cured, or processed meats  Buy breads that have less than 80 mg of sodium per slice. Cooking  Eat more home-cooked food and less restaurant, buffet, and fast food.  Avoid adding salt when cooking. Use salt-free seasonings or herbs instead of table salt or sea salt. Check with your health care provider or pharmacist before using salt substitutes.  Cook with plant-based oils, such as canola, sunflower, or olive oil. Meal planning  When eating at a restaurant, ask that your food be prepared with less salt or no salt, if possible.  Avoid foods that contain MSG (monosodium glutamate). MSG is sometimes added to [place] food, bouillon, and some canned foods. What foods are recommended? The items listed may not be a complete list. Talk with your dietitian about what dietary choices are best for you. Grains Low-sodium cereals, including oats, puffed wheat and rice, and shredded wheat. Low-sodium crackers. Unsalted rice. Unsalted pasta. Low-sodium bread. Whole-grain breads and whole-grain pasta. Vegetables Fresh or frozen vegetables. "No salt added" canned vegetables. "No salt added" tomato sauce and paste. Low-sodium or reduced-sodium tomato and vegetable juice. Fruits Fresh, frozen, or canned fruit. Fruit juice. Meats and other protein foods Fresh or frozen (no salt added) meat, poultry, seafood, and fish. Low-sodium canned tuna and salmon. Unsalted nuts. Dried  peas, beans, and lentils without added salt. Unsalted canned beans. Eggs. Unsalted nut butters. Dairy Milk. Soy milk. Cheese that is naturally low in sodium, such as ricotta cheese, fresh mozzarella, or Swiss cheese Low-sodium or reduced-sodium  cheese. Cream cheese. Yogurt. Fats and oils Unsalted butter. Unsalted margarine with no trans fat. Vegetable oils such as canola or olive oils. Seasonings and other foods Fresh and dried herbs and spices. Salt-free seasonings. Low-sodium mustard and ketchup. Sodium-free salad dressing. Sodium-free light mayonnaise. Fresh or refrigerated horseradish. Lemon juice. Vinegar. Homemade, reduced-sodium, or low-sodium soups. Unsalted popcorn and pretzels. Low-salt or salt-free chips. What foods are not recommended? The items listed may not be a complete list. Talk with your dietitian about what dietary choices are best for you. Grains Instant hot cereals. Bread stuffing, pancake, and biscuit mixes. Croutons. Seasoned rice or pasta mixes. Noodle soup cups. Boxed or frozen macaroni and cheese. Regular salted crackers. Self-rising flour. Vegetables Sauerkraut, pickled vegetables, and relishes. Olives. [place] fries. Onion rings. Regular canned vegetables (not low-sodium or reduced-sodium). Regular canned tomato sauce and paste (not low-sodium or reduced-sodium). Regular tomato and vegetable juice (not low-sodium or reduced-sodium). Frozen vegetables in sauces. Meats and other protein foods Meat or fish that is salted, canned, smoked, spiced, or pickled. Bacon, ham, sausage, hotdogs, corned beef, chipped beef, packaged lunch meats, salt pork, jerky, pickled herring, anchovies, regular canned tuna, sardines, salted nuts. Dairy Processed cheese and cheese spreads. Cheese curds. Blue cheese. Feta cheese. String cheese. Regular cottage cheese. Buttermilk. Canned milk. Fats and oils Salted butter. Regular margarine. Ghee. Bacon fat. Seasonings and other foods Onion salt, garlic salt, seasoned salt, table salt, and sea salt. Canned and packaged gravies. Worcestershire sauce. Tartar sauce. Barbecue sauce. Teriyaki sauce. Soy sauce, including reduced-sodium. Steak sauce. Fish sauce. Oyster sauce. Cocktail sauce.  Horseradish that you find on the shelf. Regular ketchup and mustard. Meat flavorings and tenderizers. Bouillon cubes. Hot sauce and Tabasco sauce. Premade or packaged marinades. Premade or packaged taco seasonings. Relishes. Regular salad dressings. Salsa. Potato and tortilla chips. Corn chips and puffs. Salted popcorn and pretzels. Canned or dried soups. Pizza. Frozen entrees and pot pies. Summary  Eating less sodium can help lower your blood pressure, reduce swelling, and protect your heart, liver, and kidneys.  Most people on this plan should limit their sodium intake to 1,500-2,000 mg (milligrams) of sodium each day.  Canned, boxed, and frozen foods are high in sodium. Restaurant foods, fast foods, and pizza are also very high in sodium. You also get sodium by adding salt to food.  Try to cook at home, eat more fresh fruits and vegetables, and eat less fast food, canned, processed, or prepared foods. This information is not intended to replace advice given to you by your health care provider. Make sure you discuss any questions you have with your health care provider. Document Released: 11/27/2001 Document Revised: 05/31/2016 Document Reviewed: 05/31/2016 Elsevier Interactive Patient Education  [employer].

## 2018-02-28 ENCOUNTER — Ambulatory Visit (INDEPENDENT_AMBULATORY_CARE_PROVIDER_SITE_OTHER): Payer: Medicare Other | Admitting: Family Medicine

## 2018-02-28 VITALS — BP 150/70 | HR 66 | Temp 97.6°F | Resp 14 | Ht 69.0 in | Wt 189.0 lb

## 2018-02-28 DIAGNOSIS — I6522 Occlusion and stenosis of left carotid artery: Secondary | ICD-10-CM

## 2018-02-28 DIAGNOSIS — I5023 Acute on chronic systolic (congestive) heart failure: Secondary | ICD-10-CM | POA: Diagnosis not present

## 2018-02-28 MED ORDER — LISINOPRIL 5 MG PO TABS: 5.0000 mg | ORAL_TABLET | Freq: Every day | ORAL | 3 refills | Status: DC

## 2018-02-28 NOTE — Progress Notes (Signed)
Subjective:    Patient ID: Ricardo Watkins, male    DOB: 03-Oct-1930, 84 y.o.   MRN: [id number] 02/28/18 Unfortunately, the patient was recently admitted to the hospital with acute onset of pulmonary edema secondary to acute on chronic congestive heart failure.  BNP on admission was greater than 800.  Chest x-ray revealed:  Cardiomegaly with probable mild vascular congestion and small left pleural effusion. No focal consolidation.  Ever the patient diuresed impressively and his breathing improved with diuresis.  Echocardiogram was performed in the hospital and the results are dictated below: Study Conclusions  - Left ventricle: The cavity size was mildly dilated. Systolic   function was moderately to severely reduced. The estimated   ejection fraction was in the range of 30% to 35%. There is   moderate hypokinesis of the mid-apicalapical myocardium. There is   moderate hypokinesis of the inferior myocardium. Doppler   parameters are consistent with abnormal left ventricular   relaxation (grade 1 diastolic dysfunction). - Aortic valve: Left coronary cusp mobility was moderately   restricted. Transvalvular velocity was increased, due to   stenosis. There was moderate to severe stenosis accounting for   poor systolic function. Valve area (VTI): 2 cm^2. Valve area   (Vmax): 2.12 cm^2. Valve area (Vmean): 1.96 cm^2. - Mitral valve: There was moderate regurgitation. - Left atrium: The atrium was moderately dilated. - Right atrium: The atrium was moderately dilated. - Tricuspid valve: There was moderate regurgitation. - Pulmonary arteries: Systolic pressure was mildly increased.  I believe his to severe aortic stenosis is also contributing to his acute episodes of congestive heart failure.  However his advanced age makes valve replacement too dangerous coupled with his comorbidities.  He saw his cardiologist yesterday and they recommended adding hydrochlorothiazide to his Lasix in an effort to try  to keep his blood pressure at a goal of 458 systolic.  I reviewed their note.  There was mention of his inability to tolerate ACE inhibitor/ARB due to his chronic kidney disease.  However I believe this is his best option to reduce future hospital admissions, improve overall mortality although it is risky with his renal disease.  His creatinine was recently checked and found to be 1.8 with a GFR of 34. Past Medical History:  Diagnosis Date  . Adenomatous colon polyp   . CAD (coronary artery disease)    30% LAD Stenosis, 70% ramus intermedius stenosis, treated with PTCA and angioplasty by Dr Albertine Patricia 2004  . Cataract    right eye  . CHF (congestive heart failure) ([place])   . Cough, persistent 11/04/2015  . Deficiency anemia 04/19/2014  . Diverticulosis   . DM (diabetes mellitus) ([place])   . DVT (deep venous thrombosis) ([place])    secondary to surgery  . Dyslipidemia   . Dyspnea   . Hyperlipidemia   . Hypertension   . Inguinal hernia    right  . 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 Microcytic anemia 07/07/2015  . 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 Non Hodgkin's lymphoma ([place])   . Pleural effusion, left 11/04/2015  . Prostate CA ([place]) 09/10/2011   Gleason 3+3 R, 3+4 L lobe May 2007 Rx Radioactive seed implants Dr [name]  . PVD (peripheral vascular disease) ([place])    rt renal artery stent  . Renal insufficiency   . Thrombocytopenia ([place])   . 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 Thyroid nodule    left lower lobe (annual monitoring).  . Vitamin D deficiency    Past Surgical History:  Procedure Laterality Date  . APPENDECTOMY     patient ?  Marland Kitchen CARDIAC CATHETERIZATION    . CHEST TUBE INSERTION Left 02/10/2016   Procedure: INSERTION PLEURAL DRAINAGE CATHETER;  Surgeon: [name], MD;  Location: [facility];  Service: Thoracic;   Laterality: Left;  . CHEST TUBE INSERTION Left 04/08/2016   Procedure: CHEST TUBE INSERTION;  Surgeon: [name], MD;  Location: [place];  Service: Thoracic;  Laterality: Left;  . COLONOSCOPY    . CORONARY ANGIOPLASTY    . CYSTOURETHROSCOPY     ROBOTIC ARM NUCLETRON SEED IMPLANTATION OF PROSTATE  . EXPLORATORY LAPAROTOMY     For evaluation of lymphoma  . REMOVAL OF PLEURAL DRAINAGE CATHETER Left 04/08/2016   Procedure: REMOVAL OF PLEURAL DRAINAGE CATHETER;  Surgeon: [name], MD;  Location: [facility] OR;  Service: Thoracic;  Laterality: Left;   Current Outpatient Medications on File Prior to Visit  Medication Sig Dispense Refill  . acetaminophen (TYLENOL) 500 MG tablet Take 1,000 mg by mouth every 6 (six) hours as needed for mild pain, moderate pain or headache.     . clopidogrel (PLAVIX) 75 MG tablet TAKE 1 TABLET BY MOUTH ONCE A DAY WITH BREAKFAST (Patient taking differently: Take 75 mg by mouth daily. ) 90 tablet 3  . furosemide (LASIX) 40 MG tablet Take 40mg  daily by mouth. Except excess swelling or weight gain take an additional 40mg  daily 100 tablet 3  . gabapentin (NEURONTIN) 100 MG capsule Take 1 capsule (100 mg total) by mouth at bedtime. 30 capsule 3  . Insulin Glargine (LANTUS SOLOSTAR) 100 UNIT/ML Solostar Pen INJECT 25 UNITS EVERY NIGHT AT BEDTIME (Patient taking differently: Inject 25 Units into the skin every morning. ) 5 pen 3  . levocetirizine (XYZAL) 5 MG tablet Take 1 tablet (5 mg total) by mouth every evening. 30 tablet 0  . loperamide (IMODIUM) 2 MG capsule Take 2 mg by mouth as needed for diarrhea or loose stools.    . meclizine (ANTIVERT) 12.5 MG tablet Take 1 tablet (12.5 mg total) by mouth 3 (three) times daily as needed for dizziness. 30 tablet 0  . metoprolol succinate (TOPROL-XL) 100 MG 24 hr tablet TAKE 1 TABLET BY MOUTH ONCE DAILY (Patient taking differently: Take 100 mg by mouth daily. ) 90 tablet 3  . Multiple Vitamin (MULTIVITAMIN WITH MINERALS) TABS tablet  Take 1 tablet by mouth at bedtime.    . NON FORMULARY Inject 1 Dose into the vein once a week. Platelet booster    . OVER THE COUNTER MEDICATION Place 1 drop into both eyes daily as needed (dry eyes). Over the counter lubricating eye drop    . potassium chloride 20 MEQ TBCR Take 20 mEq by mouth daily for 14 days. 14 tablet 0  . tamsulosin (FLOMAX) 0.4 MG CAPS capsule TAKE 1 CAPSULE BY MOUTH ONCE DAILY (Patient taking differently: Take 0.4 mg by mouth daily. ) 90 capsule 3  . traMADol (ULTRAM) 50 MG tablet Take 1 tablet (50 mg total) by mouth every 6 (six) hours as needed for moderate pain. 60 tablet 0   Current Facility-Administered Medications on File Prior to Visit  Medication Dose Route Frequency Provider Last Rate Last Dose  . cyanocobalamin ((VITAMIN B-12)) injection 1,000 mcg  1,000 mcg Subcutaneous Q30 days  [name], MD   1,000 mcg at 02/16/18 [phone]  . romiPLOStim (NPLATE) injection 170 mcg  170 mcg Subcutaneous Weekly [name], Ni, MD   170 mcg at 11/25/16 1345   Allergies  Allergen Reactions  . Glucophage [Metformin Hydrochloride] Other (See Comments)    Chest pain  . Zetia [Ezetimibe] Other (See Comments)    weakness  . Fenofibrate Rash  . Niacin-Lovastatin Er Rash   Social History   Socioeconomic History  . Marital status: Married    Spouse name: Not on file  . Number of children: 4  . Years of education: Not on file  . Highest education level: Not on file  Occupational History  . Occupation: retired  Scientific laboratory technician  . Financial resource strain: Not on file  . Food insecurity:    Worry: Not on file    Inability: Not on file  . Transportation needs:    Medical: Not on file    Non-medical: Not on file  Tobacco Use  . Smoking status: Former Smoker    Packs/day: 0.50    Years: 20.00    Pack years: 10.00    Types: Pipe, Cigarettes    Last attempt to quit: 06/21/1958    Years since quitting: 59.7  . Smokeless tobacco: Never Used  Substance and Sexual Activity    . Alcohol use: No    Alcohol/week: 0.0 standard drinks  . Drug use: No  . Sexual activity: Yes    Partners: Female  Lifestyle  . Physical activity:    Days per week: Not on file    Minutes per session: Not on file  . Stress: Not on file  Relationships  . Social connections:    Talks on phone: Not on file    Gets together: Not on file    Attends religious service: Not on file    Active member of club or organization: Not on file    Attends meetings of clubs or organizations: Not on file    Relationship status: Not on file  . Intimate partner violence:    Fear of current or ex partner: Not on file    Emotionally abused: Not on file    Physically abused: Not on file    Forced sexual activity: Not on file  Other Topics Concern  . Not on file  Social History Narrative  . Not on file      Review of Systems  All other systems reviewed and are negative.      Objective:   Physical Exam  Constitutional: He appears well-developed.  Non-toxic appearance. He has a sickly appearance. He does not appear ill. No distress.  Neck: Neck supple. No JVD present.  Cardiovascular: Normal rate and normal heart sounds.  Pulmonary/Chest: Effort normal and breath sounds normal. No respiratory distress. He has no wheezes. He has no rales.  Abdominal: Soft. Bowel sounds are normal. He exhibits no distension. There is no tenderness. There is no rebound.  Musculoskeletal: He exhibits edema.  Lymphadenopathy:    He has no cervical adenopathy.  Skin: He is not diaphoretic.  Vitals reviewed.         Assessment & Plan:  Acute on chronic systolic congestive heart failure ([place]) - Plan: BASIC METABOLIC PANEL WITH GFR  I had a long discussion with the patient today regarding his chronic kidney disease, his congestive heart failure, his severe aortic stenosis.  I believe his best option is to optimize his medical therapy for his congestive heart failure to  prevent future episodes of acute  pulmonary edema.  Despite the risk to his kidneys, I believe that the overall benefit to his health is improved if we start him on an ACE inhibitor.  Therefore I recommended discontinuing hydrochlorothiazide and starting lisinopril 5 mg a day.  We will monitor his renal function in 1 week as well as his blood pressure.  I will uptitrate lisinopril as tolerated to the maximum tolerable dose.  However I will do this gradually.  Continue Lasix 40 mg a day.  Monitor weight daily and use an additional 40 mg of Lasix if he gains greater than 1 pound in a 24-hour.  On consecutive days.  I also counseled the patient and his wife about the importance of sodium restriction.

## 2018-03-01 LAB — BASIC METABOLIC PANEL WITH GFR
BUN / CREAT RATIO: 16 (calc) (ref 6–22)
BUN: 36 mg/dL — ABNORMAL HIGH (ref 7–25)
CHLORIDE: 102 mmol/L (ref 98–110)
CO2: 25 mmol/L (ref 20–32)
Calcium: 8.8 mg/dL (ref 8.6–10.3)
Creat: 2.31 mg/dL — ABNORMAL HIGH (ref 0.70–1.11)
GFR, EST NON AFRICAN AMERICAN: 25 mL/min/{1.73_m2} — AB (ref >=60)
GFR, Est African American: 29 mL/min/{1.73_m2} — ABNORMAL LOW (ref >=60)
GLUCOSE: 147 mg/dL — AB (ref 65–99)
Potassium: 4.8 mmol/L (ref 3.5–5.3)
Sodium: 135 mmol/L (ref 135–146)

## 2018-03-02 ENCOUNTER — Encounter: Payer: Self-pay | Admitting: Hematology and Oncology

## 2018-03-02 ENCOUNTER — Telehealth: Payer: Self-pay | Admitting: Hematology and Oncology

## 2018-03-02 ENCOUNTER — Inpatient Hospital Stay (HOSPITAL_BASED_OUTPATIENT_CLINIC_OR_DEPARTMENT_OTHER): Payer: Medicare Other | Admitting: Hematology and Oncology

## 2018-03-02 ENCOUNTER — Inpatient Hospital Stay: Payer: Medicare Other

## 2018-03-02 DIAGNOSIS — D693 Immune thrombocytopenic purpura: Secondary | ICD-10-CM | POA: Diagnosis not present

## 2018-03-02 DIAGNOSIS — N183 Chronic kidney disease, stage 3 unspecified: Secondary | ICD-10-CM

## 2018-03-02 DIAGNOSIS — D46C Myelodysplastic syndrome with isolated del(5q) chromosomal abnormality: Secondary | ICD-10-CM | POA: Diagnosis not present

## 2018-03-02 DIAGNOSIS — D708 Other neutropenia: Secondary | ICD-10-CM

## 2018-03-02 DIAGNOSIS — D631 Anemia in chronic kidney disease: Secondary | ICD-10-CM

## 2018-03-02 DIAGNOSIS — J9601 Acute respiratory failure with hypoxia: Secondary | ICD-10-CM

## 2018-03-02 DIAGNOSIS — Z23 Encounter for immunization: Secondary | ICD-10-CM

## 2018-03-02 DIAGNOSIS — D709 Neutropenia, unspecified: Secondary | ICD-10-CM | POA: Insufficient documentation

## 2018-03-02 DIAGNOSIS — D696 Thrombocytopenia, unspecified: Secondary | ICD-10-CM

## 2018-03-02 DIAGNOSIS — D539 Nutritional anemia, unspecified: Secondary | ICD-10-CM

## 2018-03-02 DIAGNOSIS — I501 Left ventricular failure: Secondary | ICD-10-CM

## 2018-03-02 LAB — CBC WITH DIFFERENTIAL/PLATELET
BASOS ABS: 0 10*3/uL (ref 0.0–0.1)
BASOS PCT: 0 %
EOS PCT: 0 %
Eosinophils Absolute: 0 10*3/uL (ref 0.0–0.5)
HCT: 33.6 % — ABNORMAL LOW (ref 38.4–49.9)
Hemoglobin: 10 g/dL — ABNORMAL LOW (ref 13.0–17.1)
Lymphocytes Relative: 74 %
Lymphs Abs: 1.9 10*3/uL (ref 0.9–3.3)
MCH: 24.7 pg — ABNORMAL LOW (ref 27.2–33.4)
MCHC: 29.8 g/dL — ABNORMAL LOW (ref 32.0–36.0)
MCV: 83 fL (ref 79.3–98.0)
Monocytes Absolute: 0.3 10*3/uL (ref 0.1–0.9)
Monocytes Relative: 12 %
Neutro Abs: 0.4 10*3/uL — CL (ref 1.5–6.5)
Neutrophils Relative %: 14 %
PLATELETS: 85 10*3/uL — AB (ref 140–400)
RBC: 4.05 MIL/uL — AB (ref 4.20–5.82)
RDW: 27.2 % — ABNORMAL HIGH (ref 11.0–14.6)
WBC: 2.5 10*3/uL — AB (ref 4.0–10.3)

## 2018-03-02 MED ORDER — DARBEPOETIN ALFA 200 MCG/0.4ML IJ SOSY
200.0000 ug | PREFILLED_SYRINGE | Freq: Once | INTRAMUSCULAR | Status: AC
  Administered 2018-03-02: 200 ug via SUBCUTANEOUS

## 2018-03-02 MED ORDER — INFLUENZA VAC SPLIT QUAD 0.5 ML IM SUSY
0.5000 mL | PREFILLED_SYRINGE | Freq: Once | INTRAMUSCULAR | Status: AC
  Administered 2018-03-02: 0.5 mL via INTRAMUSCULAR

## 2018-03-02 MED ORDER — ROMIPLOSTIM INJECTION 500 MCG
8.0000 ug/kg | SUBCUTANEOUS | Status: DC
  Administered 2018-03-02: 685 ug via SUBCUTANEOUS
  Filled 2018-03-02: qty 1

## 2018-03-02 NOTE — Assessment & Plan Note (Signed)
He will continue monthly vitamin B12 injection along with Nplate injection today. I will see him back in 3 months. He will get darbepoetin injection to keep hemoglobin greater than 10 We discussed the importance of preventive care and reviewed the vaccination programs. He does not have any prior allergic reactions to influenza vaccination. He agrees to proceed with influenza vaccination today and we will administer it today at the clinic.

## 2018-03-02 NOTE — Progress Notes (Signed)
Pt. Tolerated injections well,Pt.'s ANC 0.4 Neutropenic precautions reinforced.

## 2018-03-02 NOTE — Assessment & Plan Note (Signed)
I am surprised to see that his platelet count is good today He will continue weekly injection as needed based on his platelet count

## 2018-03-02 NOTE — Patient Instructions (Signed)
Romiplostim injection What is this medicine? ROMIPLOSTIM (roe mi PLOE stim) helps your body make more platelets. This medicine is used to treat low platelets caused by chronic idiopathic thrombocytopenic purpura (ITP). This medicine may be used for other purposes; ask your health care provider or pharmacist if you have questions. COMMON BRAND NAME(S): Nplate What should I tell my health care provider before I take this medicine? They need to know if you have any of these conditions: -cancer or myelodysplastic syndrome -low blood counts, like low white cell, platelet, or red cell counts -take medicines that treat or prevent blood clots -an unusual or allergic reaction to romiplostim, mannitol, other medicines, foods, dyes, or preservatives -pregnant or trying to get pregnant -breast-feeding How should I use this medicine? This medicine is for injection under the skin. It is given by a health care professional in a hospital or clinic setting. A special MedGuide will be given to you before your injection. Read this information carefully each time. Talk to your pediatrician regarding the use of this medicine in children. Special care may be needed. Overdosage: If you think you have taken too much of this medicine contact a poison control center or emergency room at once. NOTE: This medicine is only for you. Do not share this medicine with others. What if I miss a dose? It is important not to miss your dose. Call your doctor or health care professional if you are unable to keep an appointment. What may interact with this medicine? Interactions are not expected. This list may not describe all possible interactions. Give your health care provider a list of all the medicines, herbs, non-prescription drugs, or dietary supplements you use. Also tell them if you smoke, drink alcohol, or use illegal drugs. Some items may interact with your medicine. What should I watch for while using this  medicine? Your condition will be monitored carefully while you are receiving this medicine. Visit your prescriber or health care professional for regular checks on your progress and for the needed blood tests. It is important to keep all appointments. What side effects may I notice from receiving this medicine? Side effects that you should report to your doctor or health care professional as soon as possible: -allergic reactions like skin rash, itching or hives, swelling of the face, lips, or tongue -shortness of breath, chest pain, swelling in a leg -unusual bleeding or bruising Side effects that usually do not require medical attention (report to your doctor or health care professional if they continue or are bothersome): -dizziness -headache -muscle aches -pain in arms and legs -stomach pain -trouble sleeping This list may not describe all possible side effects. Call your doctor for medical advice about side effects. You may report side effects to FDA at 1-800-FDA-1088. Where should I keep my medicine? This drug is given in a hospital or clinic and will not be stored at home. NOTE: This sheet is a summary. It may not cover all possible information. If you have questions about this medicine, talk to your doctor, pharmacist, or health care provider.  2018 Elsevier/Gold Standard (2008-02-05 15:13:04) Darbepoetin Alfa injection What is this medicine? DARBEPOETIN ALFA (dar be POE e tin AL fa) helps your body make more red blood cells. It is used to treat anemia caused by chronic kidney failure and chemotherapy. This medicine may be used for other purposes; ask your health care provider or pharmacist if you have questions. COMMON BRAND NAME(S): Aranesp What should I tell my health care provider before I  take this medicine? They need to know if you have any of these conditions: -blood clotting disorders or history of blood clots -cancer patient not on chemotherapy -cystic fibrosis -heart  disease, such as angina, heart failure, or a history of a heart attack -hemoglobin level of 12 g/dL or greater -high blood pressure -low levels of folate, iron, or vitamin B12 -seizures -an unusual or allergic reaction to darbepoetin, erythropoietin, albumin, hamster proteins, latex, other medicines, foods, dyes, or preservatives -pregnant or trying to get pregnant -breast-feeding How should I use this medicine? This medicine is for injection into a vein or under the skin. It is usually given by a health care professional in a hospital or clinic setting. If you get this medicine at home, you will be taught how to prepare and give this medicine. Use exactly as directed. Take your medicine at regular intervals. Do not take your medicine more often than directed. It is important that you put your used needles and syringes in a special sharps container. Do not put them in a trash can. If you do not have a sharps container, call your pharmacist or healthcare provider to get one. A special MedGuide will be given to you by the pharmacist with each prescription and refill. Be sure to read this information carefully each time. Talk to your pediatrician regarding the use of this medicine in children. While this medicine may be used in children as young as 1 year for selected conditions, precautions do apply. Overdosage: If you think you have taken too much of this medicine contact a poison control center or emergency room at once. NOTE: This medicine is only for you. Do not share this medicine with others. What if I miss a dose? If you miss a dose, take it as soon as you can. If it is almost time for your next dose, take only that dose. Do not take double or extra doses. What may interact with this medicine? Do not take this medicine with any of the following medications: -epoetin alfa This list may not describe all possible interactions. Give your health care provider a list of all the medicines, herbs,  non-prescription drugs, or dietary supplements you use. Also tell them if you smoke, drink alcohol, or use illegal drugs. Some items may interact with your medicine. What should I watch for while using this medicine? Your condition will be monitored carefully while you are receiving this medicine. You may need blood work done while you are taking this medicine. What side effects may I notice from receiving this medicine? Side effects that you should report to your doctor or health care professional as soon as possible: -allergic reactions like skin rash, itching or hives, swelling of the face, lips, or tongue -breathing problems -changes in vision -chest pain -confusion, trouble speaking or understanding -feeling faint or lightheaded, falls -high blood pressure -muscle aches or pains -pain, swelling, warmth in the leg -rapid weight gain -severe headaches -sudden numbness or weakness of the face, arm or leg -trouble walking, dizziness, loss of balance or coordination -seizures (convulsions) -swelling of the ankles, feet, hands -unusually weak or tired Side effects that usually do not require medical attention (report to your doctor or health care professional if they continue or are bothersome): -diarrhea -fever, chills (flu-like symptoms) -headaches -nausea, vomiting -redness, stinging, or swelling at site where injected This list may not describe all possible side effects. Call your doctor for medical advice about side effects. You may report side effects to FDA at  1-800-FDA-1088. Where should I keep my medicine? Keep out of the reach of children. Store in a refrigerator between 2 and 8 degrees C (36 and 46 degrees F). Do not freeze. Do not shake. Throw away any unused portion if using a single-dose vial. Throw away any unused medicine after the expiration date. NOTE: This sheet is a summary. It may not cover all possible information. If you have questions about this medicine, talk  to your doctor, pharmacist, or health care provider.  2018 Elsevier/Gold Standard (2016-01-26 19:52:26) Romiplostim injection What is this medicine? ROMIPLOSTIM (roe mi PLOE stim) helps your body make more platelets. This medicine is used to treat low platelets caused by chronic idiopathic thrombocytopenic purpura (ITP). This medicine may be used for other purposes; ask your health care provider or pharmacist if you have questions. COMMON BRAND NAME(S): Nplate What should I tell my health care provider before I take this medicine? They need to know if you have any of these conditions: -cancer or myelodysplastic syndrome -low blood counts, like low white cell, platelet, or red cell counts -take medicines that treat or prevent blood clots -an unusual or allergic reaction to romiplostim, mannitol, other medicines, foods, dyes, or preservatives -pregnant or trying to get pregnant -breast-feeding How should I use this medicine? This medicine is for injection under the skin. It is given by a health care professional in a hospital or clinic setting. A special MedGuide will be given to you before your injection. Read this information carefully each time. Talk to your pediatrician regarding the use of this medicine in children. Special care may be needed. Overdosage: If you think you have taken too much of this medicine contact a poison control center or emergency room at once. NOTE: This medicine is only for you. Do not share this medicine with others. What if I miss a dose? It is important not to miss your dose. Call your doctor or health care professional if you are unable to keep an appointment. What may interact with this medicine? Interactions are not expected. This list may not describe all possible interactions. Give your health care provider a list of all the medicines, herbs, non-prescription drugs, or dietary supplements you use. Also tell them if you smoke, drink alcohol, or use illegal  drugs. Some items may interact with your medicine. What should I watch for while using this medicine? Your condition will be monitored carefully while you are receiving this medicine. Visit your prescriber or health care professional for regular checks on your progress and for the needed blood tests. It is important to keep all appointments. What side effects may I notice from receiving this medicine? Side effects that you should report to your doctor or health care professional as soon as possible: -allergic reactions like skin rash, itching or hives, swelling of the face, lips, or tongue -shortness of breath, chest pain, swelling in a leg -unusual bleeding or bruising Side effects that usually do not require medical attention (report to your doctor or health care professional if they continue or are bothersome): -dizziness -headache -muscle aches -pain in arms and legs -stomach pain -trouble sleeping This list may not describe all possible side effects. Call your doctor for medical advice about side effects. You may report side effects to FDA at 1-800-FDA-1088. Where should I keep my medicine? This drug is given in a hospital or clinic and will not be stored at home. NOTE: This sheet is a summary. It may not cover all possible information. If you  have questions about this medicine, talk to your doctor, pharmacist, or health care provider.  2018 Elsevier/Gold Standard (2008-02-05 15:13:04)

## 2018-03-02 NOTE — Telephone Encounter (Signed)
Gave avs and calendar ° °

## 2018-03-02 NOTE — Assessment & Plan Note (Signed)
The patient had recent hospitalization for congestive heart failure Examination today showed no evidence of fluid overload He will continue close follow-up with cardiologist for medical management

## 2018-03-02 NOTE — Assessment & Plan Note (Signed)
This is due to underlying bone marrow disease He is not symptomatic He does not need prophylactic G-CSF or antibiotic treatment. We discussed neutropenic precaution.

## 2018-03-02 NOTE — Progress Notes (Signed)
[place] OFFICE PROGRESS NOTE  Patient Care Team: [name], MD as PCP - General (Family Medicine) Heath Lark, MD as Consulting Physician (Hematology and Oncology) [name], MD as Consulting Physician (Cardiology)  ASSESSMENT & PLAN:  MDS (myelodysplastic syndrome) with 5q deletion [facility]) He will continue monthly vitamin B12 injection along with Nplate injection today. I will see him back in 3 months. He will get darbepoetin injection to keep hemoglobin greater than 10 We discussed the importance of preventive care and reviewed the vaccination programs. He does not have any prior allergic reactions to influenza vaccination. He agrees to proceed with influenza vaccination today and we will administer it today at the clinic.   Thrombocytopenia ([place]) I am surprised to see that his platelet count is good today He will continue weekly injection as needed based on his platelet count  Pulmonary edema with congestive heart failure [facility]) The patient had recent hospitalization for congestive heart failure Examination today showed no evidence of fluid overload He will continue close follow-up with cardiologist for medical management  Chronic neutropenia ([place]) This is due to underlying bone marrow disease He is not symptomatic He does not need prophylactic G-CSF or antibiotic treatment. We discussed neutropenic precaution.   No orders of the defined types were placed in this encounter.   INTERVAL HISTORY: Please see below for problem oriented charting. He returns with family members for further follow-up He was recently hospitalized for congestive heart failure Today, he denies chest pain, shortness of breath or cough The patient denies any recent signs or symptoms of bleeding such as spontaneous epistaxis, hematuria or hematochezia. He denies recent falls.  SUMMARY OF ONCOLOGIC HISTORY: Oncology History   Calculated R-IPSS score at low risk     MDS  (myelodysplastic syndrome) with 5q deletion ([place])   11/07/2014 Bone Marrow Biopsy    Accession: [id number] marrow biopsy showed myelodysplastic syndrome with 5q deletion.    04/02/2015 - 07/01/2016 Chemotherapy    He started taking Promacta daily for thrombocytopenia, stopped due to lack of response    07/15/2016 Miscellaneous    Starting July 15, 2016, he is receiving Nplate injection along with darbepoetin     REVIEW OF SYSTEMS:   Constitutional: Denies fevers, chills or abnormal weight loss Eyes: Denies blurriness of vision Ears, nose, mouth, throat, and face: Denies mucositis or sore throat Respiratory: Denies cough, dyspnea or wheezes Cardiovascular: Denies palpitation, chest discomfort or lower extremity swelling Gastrointestinal:  Denies nausea, heartburn or change in bowel habits Skin: Denies abnormal skin rashes Lymphatics: Denies new lymphadenopathy  Neurological:Denies numbness, tingling or new weaknesses Behavioral/Psych: Mood is stable, no new changes  All other systems were reviewed with the patient and are negative.  I have reviewed the past medical history, past surgical history, social history and family history with the patient and they are unchanged from previous note.  ALLERGIES:  is allergic to glucophage [metformin hydrochloride]; zetia [ezetimibe]; fenofibrate; and niacin-lovastatin er.  MEDICATIONS:  Current Outpatient Medications  Medication Sig Dispense Refill  . acetaminophen (TYLENOL) 500 MG tablet Take 1,000 mg by mouth every 6 (six) hours as needed for mild pain, moderate pain or headache.     . clopidogrel (PLAVIX) 75 MG tablet TAKE 1 TABLET BY MOUTH ONCE A DAY WITH BREAKFAST (Patient taking differently: Take 75 mg by mouth daily. ) 90 tablet 3  . furosemide (LASIX) 40 MG tablet Take 40m daily by mouth. Except excess swelling or weight gain take an additional 419mdaily 100 tablet  3  . gabapentin (NEURONTIN) 100 MG capsule Take 1 capsule (100 mg  total) by mouth at bedtime. 30 capsule 3  . Insulin Glargine (LANTUS SOLOSTAR) 100 UNIT/ML Solostar Pen INJECT 25 UNITS EVERY NIGHT AT BEDTIME (Patient taking differently: Inject 25 Units into the skin every morning. ) 5 pen 3  . levocetirizine (XYZAL) 5 MG tablet Take 1 tablet (5 mg total) by mouth every evening. 30 tablet 0  . lisinopril (PRINIVIL,ZESTRIL) 5 MG tablet Take 1 tablet (5 mg total) by mouth daily. 90 tablet 3  . loperamide (IMODIUM) 2 MG capsule Take 2 mg by mouth as needed for diarrhea or loose stools.    . meclizine (ANTIVERT) 12.5 MG tablet Take 1 tablet (12.5 mg total) by mouth 3 (three) times daily as needed for dizziness. 30 tablet 0  . metoprolol succinate (TOPROL-XL) 100 MG 24 hr tablet TAKE 1 TABLET BY MOUTH ONCE DAILY (Patient taking differently: Take 100 mg by mouth daily. ) 90 tablet 3  . Multiple Vitamin (MULTIVITAMIN WITH MINERALS) TABS tablet Take 1 tablet by mouth at bedtime.    . NON FORMULARY Inject 1 Dose into the vein once a week. Platelet booster    . OVER THE COUNTER MEDICATION Place 1 drop into both eyes daily as needed (dry eyes). Over the counter lubricating eye drop    . potassium chloride 20 MEQ TBCR Take 20 mEq by mouth daily for 14 days. 14 tablet 0  . tamsulosin (FLOMAX) 0.4 MG CAPS capsule TAKE 1 CAPSULE BY MOUTH ONCE DAILY (Patient taking differently: Take 0.4 mg by mouth daily. ) 90 capsule 3  . traMADol (ULTRAM) 50 MG tablet Take 1 tablet (50 mg total) by mouth every 6 (six) hours as needed for moderate pain. 60 tablet 0   Current Facility-Administered Medications  Medication Dose Route Frequency Provider Last Rate Last Dose  . cyanocobalamin ((VITAMIN B-12)) injection 1,000 mcg  1,000 mcg Subcutaneous Q30 days [name], MD   1,000 mcg at 02/16/18 6433   Facility-Administered Medications Ordered in Other Visits  Medication Dose Route Frequency Provider Last Rate Last Dose  . Darbepoetin Alfa (ARANESP) injection 200 mcg  200 mcg  Subcutaneous Once Dewarren Ledbetter, MD      . romiPLOStim (NPLATE) injection 170 mcg  170 mcg Subcutaneous Weekly [name], Sabir Charters, MD   170 mcg at 11/25/16 1345  . romiPLOStim (NPLATE) injection 85 mcg  1 mcg/kg Subcutaneous Weekly Melat Wrisley, MD        PHYSICAL EXAMINATION: ECOG PERFORMANCE STATUS: 2 - Symptomatic, <50% confined to bed  Vitals:   03/02/18 1529  BP: (!) 111/48  Pulse: 65  Resp: 18  Temp: 97.8 F (36.6 C)  SpO2: 99%   Filed Weights   03/02/18 1529  Weight: 189 lb (85.7 kg)    GENERAL:alert, no distress and comfortable SKIN: Noted skin bruising EYES: normal, Conjunctiva are pink and non-injected, sclera clear OROPHARYNX:no exudate, no erythema and lips, buccal mucosa, and tongue normal  NECK: supple, thyroid normal size, non-tender, without nodularity LYMPH:  no palpable lymphadenopathy in the cervical, axillary or inguinal LUNGS: clear to auscultation and percussion with normal breathing effort HEART: regular rate & rhythm and no murmurs with mild bilateral lower extremity edema ABDOMEN:abdomen soft, non-tender and normal bowel sounds Musculoskeletal:no cyanosis of digits and no clubbing  NEURO: alert & oriented x 3 with fluent speech, with some mild gait instability  LABORATORY DATA:  I have reviewed the data as listed    Component Value Date/Time  NA 135 02/28/2018 1648   NA 138 03/02/2016 1238   K 4.8 02/28/2018 1648   K 4.6 03/02/2016 1238   CL 102 02/28/2018 1648   CL 107 09/27/2012 0940   CO2 25 02/28/2018 1648   CO2 25 03/02/2016 1238   GLUCOSE 147 (H) 02/28/2018 1648   GLUCOSE 163 (H) 03/02/2016 1238   GLUCOSE 89 09/27/2012 0940   BUN 36 (H) 02/28/2018 1648   BUN 20.9 03/02/2016 1238   CREATININE 2.31 (H) 02/28/2018 1648   CREATININE 1.8 (H) 03/02/2016 1238   CALCIUM 8.8 02/28/2018 1648   CALCIUM 8.4 03/02/2016 1238   PROT 7.1 11/04/2017 0935   PROT 8.2 03/02/2016 1238   ALBUMIN 3.7 11/04/2017 0935   ALBUMIN 2.6 (L) 03/02/2016 1238   AST 19  11/04/2017 0935   AST 21 03/02/2016 1238   ALT 16 (L) 11/04/2017 0935   ALT 29 03/02/2016 1238   ALKPHOS 56 11/04/2017 0935   ALKPHOS 84 03/02/2016 1238   BILITOT 0.7 11/04/2017 0935   BILITOT <0.30 03/02/2016 1238   GFRNONAA 25 (L) 02/28/2018 1648   GFRAA 29 (L) 02/28/2018 1648    No results found for: SPEP, UPEP  Lab Results  Component Value Date   WBC 2.5 (L) 03/02/2018   NEUTROABS 0.4 (LL) 03/02/2018   HGB 10.0 (L) 03/02/2018   HCT 33.6 (L) 03/02/2018   MCV 83.0 03/02/2018   PLT 85 (L) 03/02/2018      Chemistry      Component Value Date/Time   NA 135 02/28/2018 1648   NA 138 03/02/2016 1238   K 4.8 02/28/2018 1648   K 4.6 03/02/2016 1238   CL 102 02/28/2018 1648   CL 107 09/27/2012 0940   CO2 25 02/28/2018 1648   CO2 25 03/02/2016 1238   BUN 36 (H) 02/28/2018 1648   BUN 20.9 03/02/2016 1238   CREATININE 2.31 (H) 02/28/2018 1648   CREATININE 1.8 (H) 03/02/2016 1238      Component Value Date/Time   CALCIUM 8.8 02/28/2018 1648   CALCIUM 8.4 03/02/2016 1238   ALKPHOS 56 11/04/2017 0935   ALKPHOS 84 03/02/2016 1238   AST 19 11/04/2017 0935   AST 21 03/02/2016 1238   ALT 16 (L) 11/04/2017 0935   ALT 29 03/02/2016 1238   BILITOT 0.7 11/04/2017 0935   BILITOT <0.30 03/02/2016 1238       RADIOGRAPHIC STUDIES: I have personally reviewed the radiological images as listed and agreed with the findings in the report. Dg Chest 2 View  Result Date: 02/19/2018 CLINICAL DATA:  84 year old male with chest pain. EXAM: CHEST - 2 VIEW COMPARISON:  Chest radiograph dated 01/09/2018 FINDINGS: There is mild eventration of the right hemidiaphragm. Mild perihilar and central streaky densities may represent atelectatic changes or mild vascular congestion. There is a small left pleural effusion. No focal consolidation or pneumothorax. Stable mild cardiomegaly. No acute osseous pathology. IMPRESSION: Cardiomegaly with probable mild vascular congestion and small left pleural effusion.  No focal consolidation. Electronically Signed   By: Anner Crete M.D.   On: 02/19/2018 06:57    All questions were answered. The patient knows to call the clinic with any problems, questions or concerns. No barriers to learning was detected.  I spent 15 minutes counseling the patient face to face. The total time spent in the appointment was 20 minutes and more than 50% was on counseling and review of test results  Heath Lark, MD 03/02/2018 3:53 PM

## 2018-03-07 ENCOUNTER — Other Ambulatory Visit: Payer: Medicare Other

## 2018-03-07 DIAGNOSIS — N289 Disorder of kidney and ureter, unspecified: Secondary | ICD-10-CM | POA: Diagnosis not present

## 2018-03-08 ENCOUNTER — Inpatient Hospital Stay: Payer: Medicare Other

## 2018-03-08 ENCOUNTER — Telehealth: Payer: Self-pay

## 2018-03-08 DIAGNOSIS — D539 Nutritional anemia, unspecified: Secondary | ICD-10-CM

## 2018-03-08 DIAGNOSIS — D631 Anemia in chronic kidney disease: Secondary | ICD-10-CM | POA: Diagnosis not present

## 2018-03-08 DIAGNOSIS — Z23 Encounter for immunization: Secondary | ICD-10-CM | POA: Diagnosis not present

## 2018-03-08 DIAGNOSIS — D693 Immune thrombocytopenic purpura: Secondary | ICD-10-CM | POA: Diagnosis not present

## 2018-03-08 DIAGNOSIS — J9601 Acute respiratory failure with hypoxia: Secondary | ICD-10-CM

## 2018-03-08 DIAGNOSIS — N183 Chronic kidney disease, stage 3 unspecified: Secondary | ICD-10-CM

## 2018-03-08 DIAGNOSIS — D708 Other neutropenia: Secondary | ICD-10-CM | POA: Diagnosis not present

## 2018-03-08 DIAGNOSIS — D46C Myelodysplastic syndrome with isolated del(5q) chromosomal abnormality: Secondary | ICD-10-CM

## 2018-03-08 DIAGNOSIS — D696 Thrombocytopenia, unspecified: Secondary | ICD-10-CM

## 2018-03-08 LAB — CBC WITH DIFFERENTIAL/PLATELET
BASOS ABS: 0 10*3/uL (ref 0.0–0.1)
Basophils Relative: 0 %
EOS ABS: 0 10*3/uL (ref 0.0–0.5)
EOS PCT: 0 %
HCT: 33.4 % — ABNORMAL LOW (ref 38.4–49.9)
Hemoglobin: 9.9 g/dL — ABNORMAL LOW (ref 13.0–17.1)
Lymphocytes Relative: 74 %
Lymphs Abs: 1.9 10*3/uL (ref 0.9–3.3)
MCH: 24.4 pg — AB (ref 27.2–33.4)
MCHC: 29.6 g/dL — ABNORMAL LOW (ref 32.0–36.0)
MCV: 82.5 fL (ref 79.3–98.0)
Monocytes Absolute: 0.2 10*3/uL (ref 0.1–0.9)
Monocytes Relative: 9 %
Neutro Abs: 0.4 10*3/uL — CL (ref 1.5–6.5)
Neutrophils Relative %: 17 %
PLATELETS: 49 10*3/uL — AB (ref 140–400)
RBC: 4.05 MIL/uL — AB (ref 4.20–5.82)
RDW: 26.8 % — ABNORMAL HIGH (ref 11.0–14.6)
WBC: 2.5 10*3/uL — AB (ref 4.0–10.3)

## 2018-03-08 LAB — BASIC METABOLIC PANEL
BUN/Creatinine Ratio: 17 (calc) (ref 6–22)
BUN: 43 mg/dL — ABNORMAL HIGH (ref 7–25)
CALCIUM: 8.9 mg/dL (ref 8.6–10.3)
CHLORIDE: 102 mmol/L (ref 98–110)
CO2: 25 mmol/L (ref 20–32)
Creat: 2.53 mg/dL — ABNORMAL HIGH (ref 0.70–1.11)
GLUCOSE: 129 mg/dL — AB (ref 65–99)
Potassium: 5.6 mmol/L — ABNORMAL HIGH (ref 3.5–5.3)
Sodium: 134 mmol/L — ABNORMAL LOW (ref 135–146)

## 2018-03-08 MED ORDER — ROMIPLOSTIM INJECTION 500 MCG
9.0000 ug/kg | SUBCUTANEOUS | Status: DC
  Administered 2018-03-08: 770 ug via SUBCUTANEOUS
  Filled 2018-03-08: qty 0.54

## 2018-03-08 NOTE — Progress Notes (Signed)
Pt. Tolerated injection well, No further problems or concerns noted. 

## 2018-03-08 NOTE — Patient Instructions (Signed)
Romiplostim injection What is this medicine? ROMIPLOSTIM (roe mi PLOE stim) helps your body make more platelets. This medicine is used to treat low platelets caused by chronic idiopathic thrombocytopenic purpura (ITP). This medicine may be used for other purposes; ask your health care provider or pharmacist if you have questions. COMMON BRAND NAME(S): Nplate What should I tell my health care provider before I take this medicine? They need to know if you have any of these conditions: -cancer or myelodysplastic syndrome -low blood counts, like low white cell, platelet, or red cell counts -take medicines that treat or prevent blood clots -an unusual or allergic reaction to romiplostim, mannitol, other medicines, foods, dyes, or preservatives -pregnant or trying to get pregnant -breast-feeding How should I use this medicine? This medicine is for injection under the skin. It is given by a health care professional in a hospital or clinic setting. A special MedGuide will be given to you before your injection. Read this information carefully each time. Talk to your pediatrician regarding the use of this medicine in children. Special care may be needed. Overdosage: If you think you have taken too much of this medicine contact a poison control center or emergency room at once. NOTE: This medicine is only for you. Do not share this medicine with others. What if I miss a dose? It is important not to miss your dose. Call your doctor or health care professional if you are unable to keep an appointment. What may interact with this medicine? Interactions are not expected. This list may not describe all possible interactions. Give your health care provider a list of all the medicines, herbs, non-prescription drugs, or dietary supplements you use. Also tell them if you smoke, drink alcohol, or use illegal drugs. Some items may interact with your medicine. What should I watch for while using this  medicine? Your condition will be monitored carefully while you are receiving this medicine. Visit your prescriber or health care professional for regular checks on your progress and for the needed blood tests. It is important to keep all appointments. What side effects may I notice from receiving this medicine? Side effects that you should report to your doctor or health care professional as soon as possible: -allergic reactions like skin rash, itching or hives, swelling of the face, lips, or tongue -shortness of breath, chest pain, swelling in a leg -unusual bleeding or bruising Side effects that usually do not require medical attention (report to your doctor or health care professional if they continue or are bothersome): -dizziness -headache -muscle aches -pain in arms and legs -stomach pain -trouble sleeping This list may not describe all possible side effects. Call your doctor for medical advice about side effects. You may report side effects to FDA at 1-800-FDA-1088. Where should I keep my medicine? This drug is given in a hospital or clinic and will not be stored at home. NOTE: This sheet is a summary. It may not cover all possible information. If you have questions about this medicine, talk to your doctor, pharmacist, or health care provider.  2018 Elsevier/Gold Standard (2008-02-05 15:13:04)  

## 2018-03-08 NOTE — Telephone Encounter (Signed)
Called pt to review today's lab results.  No answer.  VM did not identify name so message left.  Will leave note for nurse tomorrow to f/u.

## 2018-03-09 ENCOUNTER — Telehealth: Payer: Self-pay

## 2018-03-09 NOTE — Telephone Encounter (Signed)
Called and given Platelet and ANC results from yesterday. Reviewed signs and symptoms of infection and bleeding risks. He verbalized understanding. Instructed to call the office if needed.

## 2018-03-10 ENCOUNTER — Other Ambulatory Visit: Payer: Self-pay | Admitting: Family Medicine

## 2018-03-10 DIAGNOSIS — E875 Hyperkalemia: Secondary | ICD-10-CM

## 2018-03-10 DIAGNOSIS — N184 Chronic kidney disease, stage 4 (severe): Secondary | ICD-10-CM

## 2018-03-10 NOTE — Progress Notes (Signed)
bmp 

## 2018-03-13 ENCOUNTER — Other Ambulatory Visit: Payer: Medicare Other

## 2018-03-13 DIAGNOSIS — N184 Chronic kidney disease, stage 4 (severe): Secondary | ICD-10-CM | POA: Diagnosis not present

## 2018-03-13 DIAGNOSIS — E875 Hyperkalemia: Secondary | ICD-10-CM

## 2018-03-14 LAB — BASIC METABOLIC PANEL
BUN/Creatinine Ratio: 19 (calc) (ref 6–22)
BUN: 41 mg/dL — ABNORMAL HIGH (ref 7–25)
CALCIUM: 8.9 mg/dL (ref 8.6–10.3)
CHLORIDE: 106 mmol/L (ref 98–110)
CO2: 25 mmol/L (ref 20–32)
Creat: 2.21 mg/dL — ABNORMAL HIGH (ref 0.70–1.11)
Glucose, Bld: 157 mg/dL — ABNORMAL HIGH (ref 65–99)
Potassium: 5.3 mmol/L (ref 3.5–5.3)
Sodium: 138 mmol/L (ref 135–146)

## 2018-03-15 ENCOUNTER — Ambulatory Visit (INDEPENDENT_AMBULATORY_CARE_PROVIDER_SITE_OTHER): Payer: Medicare Other | Admitting: Adult Health

## 2018-03-15 ENCOUNTER — Encounter: Payer: Self-pay | Admitting: Adult Health

## 2018-03-15 VITALS — BP 124/59 | HR 52 | Ht 69.0 in | Wt 188.4 lb

## 2018-03-15 DIAGNOSIS — N183 Chronic kidney disease, stage 3 unspecified: Secondary | ICD-10-CM

## 2018-03-15 DIAGNOSIS — I5022 Chronic systolic (congestive) heart failure: Secondary | ICD-10-CM | POA: Diagnosis not present

## 2018-03-15 DIAGNOSIS — I2510 Atherosclerotic heart disease of native coronary artery without angina pectoris: Secondary | ICD-10-CM

## 2018-03-15 DIAGNOSIS — I10 Essential (primary) hypertension: Secondary | ICD-10-CM

## 2018-03-15 DIAGNOSIS — I6522 Occlusion and stenosis of left carotid artery: Secondary | ICD-10-CM

## 2018-03-15 NOTE — Progress Notes (Signed)
Cardiology Office Note   Date:  03/15/2018   ID:  Ricardo Watkins, DOB 07-31-1930, MRN [id number]  PCP:  [name], MD  Cardiologist: [facility]  Chief Complaint  Patient presents with  . Congestive Heart Failure  . Hypertension  . Coronary Artery Disease     History of Present Illness: Ricardo Watkins is a 84 y.o. male who presents for management of CAD, PTCA and angioplasty of the ramus intermedius in 2004. Other history includes Non-Hodgkin's lymphoma with Pleur-X catheter secondary to chronic non-malignant pleural effusions, IDDM hyperlipidemia, carotid artery disease, stable LICA stenosis, and hypertension, with mild AS. Recent hospitalization on 02/19/2018 for chest pain and decompensated mixed CHF. She was placed on daily lasix at 40 mg and  A 1.4 liter fluid restriction. Not placed on ACE or ARB due to CKD Stage III.   On last office visit he continued to struggle with low sodium diet and fatigue. BP was not well controlled for optimal and therefore hydralazine was added at 12.5 mg BID. BMET was checked.   He has since seen Dr.Pickard who has stopped hydralazine and started him on lisinopril 5 mg daily. Follow up labs resulted in increased creatinine and potassium. He was subsequently taken off of the ACE. Hydralazine was not restarted,   The patients's family is confused by all of the medication changes between the two providers. The patient denies symptoms.   Past Medical History:  Diagnosis Date  . Adenomatous colon polyp   . CAD (coronary artery disease)    30% LAD Stenosis, 70% ramus intermedius stenosis, treated with PTCA and angioplasty by Dr Albertine Patricia 2004  . Cataract    right eye  . CHF (congestive heart failure) ([place])   . Cough, persistent 11/04/2015  . Deficiency anemia 04/19/2014  . Diverticulosis   . DM (diabetes mellitus) ([place])   . DVT (deep venous thrombosis) ([place])    secondary to surgery  . Dyslipidemia   . Dyspnea   . Hyperlipidemia   . Hypertension   .  Inguinal hernia    right  . 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 Microcytic anemia 07/07/2015  . 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 Non Hodgkin's lymphoma ([place])   . Pleural effusion, left 11/04/2015  . Prostate CA ([place]) 09/10/2011   Gleason 3+3 R, 3+4 L lobe May 2007 Rx Radioactive seed implants Dr [name]  . PVD (peripheral vascular disease) ([place])    rt renal artery stent  . Renal insufficiency   . Thrombocytopenia ([place])   . Thrombotic stroke ([place]) 09/10/2011   January 18, 2011 infarct genu & post limb R internal capsule - acute; previous lacunar infarcts/extensive white matter dis  . Thyroid nodule    left lower lobe (annual monitoring).  . Vitamin D deficiency     Past Surgical History:  Procedure Laterality Date  . APPENDECTOMY     patient ?  Marland Kitchen CARDIAC CATHETERIZATION    . CHEST TUBE INSERTION Left 02/10/2016   Procedure: INSERTION PLEURAL DRAINAGE CATHETER;  Surgeon: [name], MD;  Location: [place];  Service: Thoracic;  Laterality: Left;  . CHEST TUBE INSERTION Left 04/08/2016   Procedure: CHEST TUBE INSERTION;  Surgeon: [name], MD;  Location: [place];  Service: Thoracic;  Laterality: Left;  . COLONOSCOPY    . CORONARY ANGIOPLASTY    . CYSTOURETHROSCOPY     ROBOTIC ARM NUCLETRON SEED IMPLANTATION OF PROSTATE  . EXPLORATORY LAPAROTOMY  For evaluation of lymphoma  . REMOVAL OF PLEURAL DRAINAGE CATHETER Left 04/08/2016   Procedure: REMOVAL OF PLEURAL DRAINAGE CATHETER;  Surgeon: [name], MD;  Location: [facility] OR;  Service: Thoracic;  Laterality: Left;     Current Outpatient Medications  Medication Sig Dispense Refill  . acetaminophen (TYLENOL) 500 MG tablet Take 1,000 mg by mouth every 6 (six) hours as needed for mild pain, moderate pain or headache.     . clopidogrel (PLAVIX) 75 MG tablet TAKE 1 TABLET BY MOUTH ONCE A DAY WITH BREAKFAST (Patient taking differently: Take 75 mg by mouth daily. ) 90 tablet 3  .  furosemide (LASIX) 40 MG tablet Take 40mg  daily by mouth. Except excess swelling or weight gain take an additional 40mg  daily 100 tablet 3  . gabapentin (NEURONTIN) 100 MG capsule Take 1 capsule (100 mg total) by mouth at bedtime. 30 capsule 3  . Insulin Glargine (LANTUS SOLOSTAR) 100 UNIT/ML Solostar Pen INJECT 25 UNITS EVERY NIGHT AT BEDTIME (Patient taking differently: Inject 25 Units into the skin every morning. ) 5 pen 3  . loperamide (IMODIUM) 2 MG capsule Take 2 mg by mouth as needed for diarrhea or loose stools.    . meclizine (ANTIVERT) 12.5 MG tablet Take 1 tablet (12.5 mg total) by mouth 3 (three) times daily as needed for dizziness. 30 tablet 0  . metoprolol succinate (TOPROL-XL) 100 MG 24 hr tablet TAKE 1 TABLET BY MOUTH ONCE DAILY (Patient taking differently: Take 100 mg by mouth daily. ) 90 tablet 3  . Multiple Vitamin (MULTIVITAMIN WITH MINERALS) TABS tablet Take 1 tablet by mouth at bedtime.    . NON FORMULARY Inject 1 Dose into the vein once a week. Platelet booster    . OVER THE COUNTER MEDICATION Place 1 drop into both eyes daily as needed (dry eyes). Over the counter lubricating eye drop    . tamsulosin (FLOMAX) 0.4 MG CAPS capsule TAKE 1 CAPSULE BY MOUTH ONCE DAILY (Patient taking differently: Take 0.4 mg by mouth daily. ) 90 capsule 3  . traMADol (ULTRAM) 50 MG tablet Take 1 tablet (50 mg total) by mouth every 6 (six) hours as needed for moderate pain. 60 tablet 0   Current Facility-Administered Medications  Medication Dose Route Frequency Provider Last Rate Last Dose  . cyanocobalamin ((VITAMIN B-12)) injection 1,000 mcg  1,000 mcg Subcutaneous Q30 days [name], MD   1,000 mcg at 02/16/18 8299   Facility-Administered Medications Ordered in Other Visits  Medication Dose Route Frequency Provider Last Rate Last Dose  . romiPLOStim (NPLATE) injection 170 mcg  170 mcg Subcutaneous Weekly Heath Lark, MD   170 mcg at 11/25/16 1345    Allergies:   Glucophage [metformin  hydrochloride]; Zetia [ezetimibe]; Fenofibrate; and Niacin-lovastatin er    Social History:  The patient  reports that he quit smoking about 59 years ago. His smoking use included pipe and cigarettes. He has a 10.00 pack-year smoking history. He has never used smokeless tobacco. He reports that he does not drink alcohol or use drugs.   Family History:  The patient's family history includes Asthma in his son; Breast cancer in his sister; Diabetes in his brother and sister; Heart disease in his brother; Lymphoma in his sister; Prostate cancer in his brother.    ROS: All other systems are reviewed and negative. Unless otherwise mentioned in H&P    PHYSICAL EXAM: VS:  BP (!) 124/59   Pulse (!) 52   Ht 5\' 9"  (1.753 m)  Wt 188 lb 6.4 oz (85.5 kg)   BMI 27.82 kg/m  , BMI Body mass index is 27.82 kg/m. GEN: Well nourished, well developed, in no acute distress HEENT: normal Neck: no JVD, carotid bruits, or masses Cardiac: RRR; 2/6 systolic murmurs, rubs, or gallops,no edema  Respiratory:  Clear to auscultation bilaterally, normal work of breathing GI: soft, nontender, nondistended, + BS MS: no deformity or atrophy Skin: warm and dry, no rash Neuro:  Strength and sensation are intact Psych: euthymic mood, full affect   EKG: Not completed this office visit.   Recent Labs: 06/02/2017: TSH 1.992 11/04/2017: ALT 16 02/19/2018: B Natriuretic Peptide 820.7 03/08/2018: Hemoglobin 9.9; Platelets 49 03/13/2018: BUN 41; Creat 2.21; Potassium 5.3; Sodium 138    Lipid Panel    Component Value Date/Time   CHOL 102 12/15/2016 1133   TRIG 220 (H) 12/15/2016 1133   HDL 26 (L) 12/15/2016 1133   CHOLHDL 3.9 12/15/2016 1133   VLDL 44 (H) 12/15/2016 1133   LDLCALC 32 12/15/2016 1133      Wt Readings from Last 3 Encounters:  03/15/18 188 lb 6.4 oz (85.5 kg)  03/02/18 189 lb (85.7 kg)  02/28/18 189 lb (85.7 kg)      Other studies Reviewed: Echo 02/20/2018 Left ventricle: The cavity size was  mildly dilated. Systolic   function was moderately to severely reduced. The estimated   ejection fraction was in the range of 30% to 35%. There is   moderate hypokinesis of the mid-apicalapical myocardium. There is   moderate hypokinesis of the inferior myocardium. Doppler   parameters are consistent with abnormal left ventricular   relaxation (grade 1 diastolic dysfunction). - Aortic valve: Left coronary cusp mobility was moderately   restricted. Transvalvular velocity was increased, due to   stenosis. There was moderate to severe stenosis accounting for   poor systolic function. Valve area (VTI): 2 cm^2. Valve area   (Vmax): 2.12 cm^2. Valve area (Vmean): 1.96 cm^2. - Mitral valve: There was moderate regurgitation. - Left atrium: The atrium was moderately dilated. - Right atrium: The atrium was moderately dilated. - Tricuspid valve: There was moderate regurgitation. - Pulmonary arteries: Systolic pressure was mildly increased.  ASSESSMENT AND PLAN:  1.  HFrEF: EF of 30%-35%. He has had changes to his medication regimen by PCP. This is confusing to his family. I will defer antihypertensive medications to Dr. Dennard Schaumann to avoid more than one provider manipulating medications and checking labs. I have explained this to the patient and his family.  Would recommend avoidance of ACE in the setting of CKD Stage III and hyperkalemia. Continue diuretic.   2. Hypertension: Not optimal for reduced EF but improved from last office visit. Due to frailty would avoid very low BP.   3. CKD Stage III: Followed by PCP  4. Non-Hodgkin;s lymphoma: Followed by oncology.   Current medicines are reviewed at length with the patient today.    Labs/ tests ordered today include: None   Phill Myron. West Pugh, ANP, AACC   03/15/2018 4:31 PM    [place] [address] [place] [address] Office [phone] Fax [phone]

## 2018-03-15 NOTE — Patient Instructions (Signed)
Medication Instructions:  NO CHANGES- Your physician recommends that you continue on your current medications as directed. Please refer to the Current Medication list given to you today. If you need a refill on your cardiac medications before your next appointment, please call your pharmacy.  Follow-Up: Your physician wants you to follow-up in: December [place].    Thank you for choosing [facility] at [facility]!!

## 2018-03-16 ENCOUNTER — Inpatient Hospital Stay: Payer: Medicare Other

## 2018-03-16 VITALS — BP 133/60 | HR 64

## 2018-03-16 DIAGNOSIS — Z23 Encounter for immunization: Secondary | ICD-10-CM | POA: Diagnosis not present

## 2018-03-16 DIAGNOSIS — D631 Anemia in chronic kidney disease: Secondary | ICD-10-CM

## 2018-03-16 DIAGNOSIS — N183 Chronic kidney disease, stage 3 unspecified: Secondary | ICD-10-CM

## 2018-03-16 DIAGNOSIS — D539 Nutritional anemia, unspecified: Secondary | ICD-10-CM

## 2018-03-16 DIAGNOSIS — D46C Myelodysplastic syndrome with isolated del(5q) chromosomal abnormality: Secondary | ICD-10-CM

## 2018-03-16 DIAGNOSIS — D696 Thrombocytopenia, unspecified: Secondary | ICD-10-CM

## 2018-03-16 DIAGNOSIS — D693 Immune thrombocytopenic purpura: Secondary | ICD-10-CM | POA: Diagnosis not present

## 2018-03-16 DIAGNOSIS — D708 Other neutropenia: Secondary | ICD-10-CM | POA: Diagnosis not present

## 2018-03-16 DIAGNOSIS — J9601 Acute respiratory failure with hypoxia: Secondary | ICD-10-CM

## 2018-03-16 LAB — CBC WITH DIFFERENTIAL/PLATELET
Basophils Absolute: 0 K/uL (ref 0.0–0.1)
Basophils Relative: 0 %
Eosinophils Absolute: 0 K/uL (ref 0.0–0.5)
Eosinophils Relative: 0 %
HCT: 32.8 % — ABNORMAL LOW (ref 38.4–49.9)
Hemoglobin: 9.8 g/dL — ABNORMAL LOW (ref 13.0–17.1)
Lymphocytes Relative: 65 %
Lymphs Abs: 1.7 K/uL (ref 0.9–3.3)
MCH: 24.7 pg — ABNORMAL LOW (ref 27.2–33.4)
MCHC: 29.9 g/dL — ABNORMAL LOW (ref 32.0–36.0)
MCV: 82.6 fL (ref 79.3–98.0)
Monocytes Absolute: 0.3 K/uL (ref 0.1–0.9)
Monocytes Relative: 12 %
Neutro Abs: 0.6 K/uL — ABNORMAL LOW (ref 1.5–6.5)
Neutrophils Relative %: 23 %
Platelets: 104 K/uL — ABNORMAL LOW (ref 140–400)
RBC: 3.97 MIL/uL — ABNORMAL LOW (ref 4.20–5.82)
RDW: 27.4 % — ABNORMAL HIGH (ref 11.0–14.6)
WBC: 2.7 K/uL — ABNORMAL LOW (ref 4.0–10.3)

## 2018-03-16 MED ORDER — ROMIPLOSTIM INJECTION 500 MCG
9.0000 ug/kg | SUBCUTANEOUS | Status: DC
  Administered 2018-03-16: 770 ug via SUBCUTANEOUS
  Filled 2018-03-16: qty 1

## 2018-03-16 MED ORDER — DARBEPOETIN ALFA 200 MCG/0.4ML IJ SOSY
200.0000 ug | PREFILLED_SYRINGE | Freq: Once | INTRAMUSCULAR | Status: AC
  Administered 2018-03-16: 200 ug via SUBCUTANEOUS

## 2018-03-16 MED ORDER — DARBEPOETIN ALFA 200 MCG/0.4ML IJ SOSY
PREFILLED_SYRINGE | INTRAMUSCULAR | Status: AC
  Filled 2018-03-16: qty 0.4

## 2018-03-17 ENCOUNTER — Ambulatory Visit (INDEPENDENT_AMBULATORY_CARE_PROVIDER_SITE_OTHER): Payer: Medicare Other

## 2018-03-17 DIAGNOSIS — E538 Deficiency of other specified B group vitamins: Secondary | ICD-10-CM | POA: Diagnosis not present

## 2018-03-17 NOTE — Progress Notes (Signed)
Patient came in today for his monthly vitamin B12. Vitamin B12 was given in the right deltoid. Patient tolerated well.

## 2018-03-20 ENCOUNTER — Other Ambulatory Visit: Payer: Self-pay | Admitting: Family Medicine

## 2018-03-22 ENCOUNTER — Inpatient Hospital Stay: Payer: Medicare Other

## 2018-03-22 ENCOUNTER — Inpatient Hospital Stay: Payer: Medicare Other | Attending: Hematology and Oncology

## 2018-03-22 DIAGNOSIS — D693 Immune thrombocytopenic purpura: Secondary | ICD-10-CM | POA: Insufficient documentation

## 2018-03-22 DIAGNOSIS — D696 Thrombocytopenia, unspecified: Secondary | ICD-10-CM

## 2018-03-22 DIAGNOSIS — D46C Myelodysplastic syndrome with isolated del(5q) chromosomal abnormality: Secondary | ICD-10-CM

## 2018-03-22 DIAGNOSIS — J9601 Acute respiratory failure with hypoxia: Secondary | ICD-10-CM

## 2018-03-22 DIAGNOSIS — N183 Chronic kidney disease, stage 3 unspecified: Secondary | ICD-10-CM

## 2018-03-22 DIAGNOSIS — D631 Anemia in chronic kidney disease: Secondary | ICD-10-CM | POA: Diagnosis not present

## 2018-03-22 DIAGNOSIS — D539 Nutritional anemia, unspecified: Secondary | ICD-10-CM

## 2018-03-22 LAB — CBC WITH DIFFERENTIAL/PLATELET
BASOS ABS: 0 10*3/uL (ref 0.0–0.1)
Basophils Relative: 0 %
EOS PCT: 1 %
Eosinophils Absolute: 0 10*3/uL (ref 0.0–0.5)
HCT: 31.7 % — ABNORMAL LOW (ref 38.4–49.9)
HEMOGLOBIN: 9.4 g/dL — AB (ref 13.0–17.1)
LYMPHS ABS: 1.6 10*3/uL (ref 0.9–3.3)
Lymphocytes Relative: 65 %
MCH: 24.2 pg — AB (ref 27.2–33.4)
MCHC: 29.7 g/dL — AB (ref 32.0–36.0)
MCV: 81.5 fL (ref 79.3–98.0)
MONO ABS: 0.3 10*3/uL (ref 0.1–0.9)
Monocytes Relative: 12 %
NEUTROS ABS: 0.6 10*3/uL — AB (ref 1.5–6.5)
Neutrophils Relative %: 22 %
PLATELETS: 109 10*3/uL — AB (ref 140–400)
RBC: 3.89 MIL/uL — ABNORMAL LOW (ref 4.20–5.82)
RDW: 27.4 % — ABNORMAL HIGH (ref 11.0–14.6)
WBC: 2.5 10*3/uL — ABNORMAL LOW (ref 4.0–10.3)

## 2018-03-22 MED ORDER — ROMIPLOSTIM INJECTION 500 MCG
9.0000 ug/kg | SUBCUTANEOUS | Status: DC
  Administered 2018-03-22: 770 ug via SUBCUTANEOUS
  Filled 2018-03-22: qty 1.54

## 2018-03-29 ENCOUNTER — Inpatient Hospital Stay: Payer: Medicare Other

## 2018-03-29 VITALS — BP 146/58 | HR 61 | Temp 98.2°F | Resp 18

## 2018-03-29 DIAGNOSIS — D46C Myelodysplastic syndrome with isolated del(5q) chromosomal abnormality: Secondary | ICD-10-CM

## 2018-03-29 DIAGNOSIS — N183 Chronic kidney disease, stage 3 unspecified: Secondary | ICD-10-CM

## 2018-03-29 DIAGNOSIS — D631 Anemia in chronic kidney disease: Secondary | ICD-10-CM | POA: Diagnosis not present

## 2018-03-29 DIAGNOSIS — D693 Immune thrombocytopenic purpura: Secondary | ICD-10-CM

## 2018-03-29 DIAGNOSIS — D696 Thrombocytopenia, unspecified: Secondary | ICD-10-CM

## 2018-03-29 DIAGNOSIS — D539 Nutritional anemia, unspecified: Secondary | ICD-10-CM

## 2018-03-29 DIAGNOSIS — J9601 Acute respiratory failure with hypoxia: Secondary | ICD-10-CM

## 2018-03-29 LAB — CBC WITH DIFFERENTIAL/PLATELET
Abs Immature Granulocytes: 0.02 10*3/uL (ref 0.00–0.07)
Basophils Absolute: 0 10*3/uL (ref 0.0–0.1)
Basophils Relative: 0 %
EOS ABS: 0 10*3/uL (ref 0.0–0.5)
EOS PCT: 0 %
HCT: 32 % — ABNORMAL LOW (ref 39.0–52.0)
HEMOGLOBIN: 9.2 g/dL — AB (ref 13.0–17.0)
IMMATURE GRANULOCYTES: 1 %
LYMPHS ABS: 2 10*3/uL (ref 0.7–4.0)
Lymphocytes Relative: 65 %
MCH: 24.1 pg — AB (ref 26.0–34.0)
MCHC: 28.8 g/dL — ABNORMAL LOW (ref 30.0–36.0)
MCV: 83.8 fL (ref 80.0–100.0)
MONO ABS: 0.3 10*3/uL (ref 0.1–1.0)
Monocytes Relative: 10 %
Neutro Abs: 0.7 10*3/uL — ABNORMAL LOW (ref 1.7–7.7)
Neutrophils Relative %: 24 %
Platelets: 126 10*3/uL — ABNORMAL LOW (ref 150–400)
RBC: 3.82 MIL/uL — ABNORMAL LOW (ref 4.22–5.81)
RDW: 27.7 % — ABNORMAL HIGH (ref 11.5–15.5)
WBC: 3 10*3/uL — ABNORMAL LOW (ref 4.0–10.5)
nRBC: 0 % (ref 0.0–0.2)

## 2018-03-29 MED ORDER — ROMIPLOSTIM INJECTION 500 MCG
770.0000 ug | SUBCUTANEOUS | Status: DC
  Administered 2018-03-29: 770 ug via SUBCUTANEOUS
  Filled 2018-03-29: qty 1

## 2018-03-29 MED ORDER — DARBEPOETIN ALFA 200 MCG/0.4ML IJ SOSY
PREFILLED_SYRINGE | INTRAMUSCULAR | Status: AC
  Filled 2018-03-29: qty 0.4

## 2018-03-29 MED ORDER — DARBEPOETIN ALFA 200 MCG/0.4ML IJ SOSY
200.0000 ug | PREFILLED_SYRINGE | Freq: Once | INTRAMUSCULAR | Status: AC
  Administered 2018-03-29: 200 ug via SUBCUTANEOUS

## 2018-04-05 ENCOUNTER — Inpatient Hospital Stay: Payer: Medicare Other

## 2018-04-05 DIAGNOSIS — D696 Thrombocytopenia, unspecified: Secondary | ICD-10-CM

## 2018-04-05 DIAGNOSIS — D693 Immune thrombocytopenic purpura: Secondary | ICD-10-CM

## 2018-04-05 DIAGNOSIS — J9601 Acute respiratory failure with hypoxia: Secondary | ICD-10-CM

## 2018-04-05 DIAGNOSIS — N183 Chronic kidney disease, stage 3 unspecified: Secondary | ICD-10-CM

## 2018-04-05 DIAGNOSIS — D46C Myelodysplastic syndrome with isolated del(5q) chromosomal abnormality: Secondary | ICD-10-CM

## 2018-04-05 DIAGNOSIS — D539 Nutritional anemia, unspecified: Secondary | ICD-10-CM

## 2018-04-05 DIAGNOSIS — D631 Anemia in chronic kidney disease: Secondary | ICD-10-CM | POA: Diagnosis not present

## 2018-04-05 LAB — CBC WITH DIFFERENTIAL/PLATELET
Abs Immature Granulocytes: 0.04 10*3/uL (ref 0.00–0.07)
BASOS PCT: 0 %
Basophils Absolute: 0 10*3/uL (ref 0.0–0.1)
EOS ABS: 0 10*3/uL (ref 0.0–0.5)
EOS PCT: 0 %
HCT: 31.4 % — ABNORMAL LOW (ref 39.0–52.0)
HEMOGLOBIN: 9.1 g/dL — AB (ref 13.0–17.0)
Immature Granulocytes: 1 %
Lymphocytes Relative: 58 %
Lymphs Abs: 2 10*3/uL (ref 0.7–4.0)
MCH: 24.1 pg — AB (ref 26.0–34.0)
MCHC: 29 g/dL — AB (ref 30.0–36.0)
MCV: 83.3 fL (ref 80.0–100.0)
Monocytes Absolute: 0.4 10*3/uL (ref 0.1–1.0)
Monocytes Relative: 12 %
NRBC: 0 % (ref 0.0–0.2)
Neutro Abs: 1 10*3/uL — ABNORMAL LOW (ref 1.7–7.7)
Neutrophils Relative %: 29 %
PLATELETS: 160 10*3/uL (ref 150–400)
RBC: 3.77 MIL/uL — ABNORMAL LOW (ref 4.22–5.81)
RDW: 28.1 % — AB (ref 11.5–15.5)
WBC: 3.5 10*3/uL — ABNORMAL LOW (ref 4.0–10.5)

## 2018-04-05 MED ORDER — ROMIPLOSTIM INJECTION 500 MCG
770.0000 ug | SUBCUTANEOUS | Status: DC
  Administered 2018-04-05: 770 ug via SUBCUTANEOUS
  Filled 2018-04-05: qty 1

## 2018-04-08 ENCOUNTER — Other Ambulatory Visit: Payer: Self-pay | Admitting: Family Medicine

## 2018-04-11 ENCOUNTER — Other Ambulatory Visit: Payer: Self-pay | Admitting: Family Medicine

## 2018-04-12 ENCOUNTER — Ambulatory Visit (INDEPENDENT_AMBULATORY_CARE_PROVIDER_SITE_OTHER): Payer: Medicare Other | Admitting: Podiatry

## 2018-04-12 DIAGNOSIS — M79674 Pain in right toe(s): Secondary | ICD-10-CM

## 2018-04-12 DIAGNOSIS — Z9229 Personal history of other drug therapy: Secondary | ICD-10-CM

## 2018-04-12 DIAGNOSIS — E1151 Type 2 diabetes mellitus with diabetic peripheral angiopathy without gangrene: Secondary | ICD-10-CM

## 2018-04-12 DIAGNOSIS — B351 Tinea unguium: Secondary | ICD-10-CM | POA: Diagnosis not present

## 2018-04-12 DIAGNOSIS — M79675 Pain in left toe(s): Secondary | ICD-10-CM | POA: Diagnosis not present

## 2018-04-13 ENCOUNTER — Inpatient Hospital Stay: Payer: Medicare Other

## 2018-04-13 VITALS — BP 128/68 | HR 62 | Temp 98.2°F | Resp 18

## 2018-04-13 DIAGNOSIS — D539 Nutritional anemia, unspecified: Secondary | ICD-10-CM

## 2018-04-13 DIAGNOSIS — D46C Myelodysplastic syndrome with isolated del(5q) chromosomal abnormality: Secondary | ICD-10-CM

## 2018-04-13 DIAGNOSIS — N183 Chronic kidney disease, stage 3 unspecified: Secondary | ICD-10-CM

## 2018-04-13 DIAGNOSIS — D631 Anemia in chronic kidney disease: Secondary | ICD-10-CM

## 2018-04-13 DIAGNOSIS — D693 Immune thrombocytopenic purpura: Secondary | ICD-10-CM | POA: Diagnosis not present

## 2018-04-13 DIAGNOSIS — D696 Thrombocytopenia, unspecified: Secondary | ICD-10-CM

## 2018-04-13 DIAGNOSIS — J9601 Acute respiratory failure with hypoxia: Secondary | ICD-10-CM

## 2018-04-13 LAB — CBC WITH DIFFERENTIAL/PLATELET
Abs Immature Granulocytes: 0.02 10*3/uL (ref 0.00–0.07)
Basophils Absolute: 0 10*3/uL (ref 0.0–0.1)
Basophils Relative: 0 %
EOS PCT: 0 %
Eosinophils Absolute: 0 10*3/uL (ref 0.0–0.5)
HCT: 29.2 % — ABNORMAL LOW (ref 39.0–52.0)
HEMOGLOBIN: 8.4 g/dL — AB (ref 13.0–17.0)
Immature Granulocytes: 1 %
Lymphocytes Relative: 62 %
Lymphs Abs: 1.6 10*3/uL (ref 0.7–4.0)
MCH: 24.3 pg — ABNORMAL LOW (ref 26.0–34.0)
MCHC: 28.8 g/dL — ABNORMAL LOW (ref 30.0–36.0)
MCV: 84.4 fL (ref 80.0–100.0)
MONO ABS: 0.3 10*3/uL (ref 0.1–1.0)
MONOS PCT: 11 %
NEUTROS ABS: 0.7 10*3/uL — AB (ref 1.7–7.7)
Neutrophils Relative %: 26 %
Platelets: 156 10*3/uL (ref 150–400)
RBC: 3.46 MIL/uL — ABNORMAL LOW (ref 4.22–5.81)
RDW: 27.7 % — ABNORMAL HIGH (ref 11.5–15.5)
WBC: 2.7 10*3/uL — AB (ref 4.0–10.5)
nRBC: 0 % (ref 0.0–0.2)

## 2018-04-13 MED ORDER — DARBEPOETIN ALFA 200 MCG/0.4ML IJ SOSY
PREFILLED_SYRINGE | INTRAMUSCULAR | Status: AC
  Filled 2018-04-13: qty 0.4

## 2018-04-13 MED ORDER — ROMIPLOSTIM INJECTION 500 MCG
770.0000 ug | SUBCUTANEOUS | Status: DC
  Administered 2018-04-13: 770 ug via SUBCUTANEOUS
  Filled 2018-04-13: qty 1.54

## 2018-04-13 MED ORDER — ROMIPLOSTIM 250 MCG ~~LOC~~ SOLR: 1.0000 ug/kg | SUBCUTANEOUS | Status: DC

## 2018-04-13 MED ORDER — DARBEPOETIN ALFA 200 MCG/0.4ML IJ SOSY: 200.0000 ug | PREFILLED_SYRINGE | Freq: Once | INTRAMUSCULAR | Status: DC

## 2018-04-13 MED ORDER — ROMIPLOSTIM INJECTION 500 MCG
770.0000 ug | SUBCUTANEOUS | Status: DC
  Filled 2018-04-13: qty 1.54

## 2018-04-13 MED ORDER — DARBEPOETIN ALFA 200 MCG/0.4ML IJ SOSY
200.0000 ug | PREFILLED_SYRINGE | Freq: Once | INTRAMUSCULAR | Status: AC
  Administered 2018-04-13: 200 ug via SUBCUTANEOUS

## 2018-04-13 NOTE — Patient Instructions (Signed)
Romiplostim injection What is this medicine? ROMIPLOSTIM (roe mi PLOE stim) helps your body make more platelets. This medicine is used to treat low platelets caused by chronic idiopathic thrombocytopenic purpura (ITP). This medicine may be used for other purposes; ask your health care provider or pharmacist if you have questions. COMMON BRAND NAME(S): Nplate What should I tell my health care provider before I take this medicine? They need to know if you have any of these conditions: -cancer or myelodysplastic syndrome -low blood counts, like low white cell, platelet, or red cell counts -take medicines that treat or prevent blood clots -an unusual or allergic reaction to romiplostim, mannitol, other medicines, foods, dyes, or preservatives -pregnant or trying to get pregnant -breast-feeding How should I use this medicine? This medicine is for injection under the skin. It is given by a health care professional in a hospital or clinic setting. A special MedGuide will be given to you before your injection. Read this information carefully each time. Talk to your pediatrician regarding the use of this medicine in children. Special care may be needed. Overdosage: If you think you have taken too much of this medicine contact a poison control center or emergency room at once. NOTE: This medicine is only for you. Do not share this medicine with others. What if I miss a dose? It is important not to miss your dose. Call your doctor or health care professional if you are unable to keep an appointment. What may interact with this medicine? Interactions are not expected. This list may not describe all possible interactions. Give your health care provider a list of all the medicines, herbs, non-prescription drugs, or dietary supplements you use. Also tell them if you smoke, drink alcohol, or use illegal drugs. Some items may interact with your medicine. What should I watch for while using this  medicine? Your condition will be monitored carefully while you are receiving this medicine. Visit your prescriber or health care professional for regular checks on your progress and for the needed blood tests. It is important to keep all appointments. What side effects may I notice from receiving this medicine? Side effects that you should report to your doctor or health care professional as soon as possible: -allergic reactions like skin rash, itching or hives, swelling of the face, lips, or tongue -shortness of breath, chest pain, swelling in a leg -unusual bleeding or bruising Side effects that usually do not require medical attention (report to your doctor or health care professional if they continue or are bothersome): -dizziness -headache -muscle aches -pain in arms and legs -stomach pain -trouble sleeping This list may not describe all possible side effects. Call your doctor for medical advice about side effects. You may report side effects to FDA at 1-800-FDA-1088. Where should I keep my medicine? This drug is given in a hospital or clinic and will not be stored at home. NOTE: This sheet is a summary. It may not cover all possible information. If you have questions about this medicine, talk to your doctor, pharmacist, or health care provider.  2018 Elsevier/Gold Standard (2008-02-05 15:13:04) Darbepoetin Alfa injection What is this medicine? DARBEPOETIN ALFA (dar be POE e tin AL fa) helps your body make more red blood cells. It is used to treat anemia caused by chronic kidney failure and chemotherapy. This medicine may be used for other purposes; ask your health care provider or pharmacist if you have questions. COMMON BRAND NAME(S): Aranesp What should I tell my health care provider before I  take this medicine? They need to know if you have any of these conditions: -blood clotting disorders or history of blood clots -cancer patient not on chemotherapy -cystic fibrosis -heart  disease, such as angina, heart failure, or a history of a heart attack -hemoglobin level of 12 g/dL or greater -high blood pressure -low levels of folate, iron, or vitamin B12 -seizures -an unusual or allergic reaction to darbepoetin, erythropoietin, albumin, hamster proteins, latex, other medicines, foods, dyes, or preservatives -pregnant or trying to get pregnant -breast-feeding How should I use this medicine? This medicine is for injection into a vein or under the skin. It is usually given by a health care professional in a hospital or clinic setting. If you get this medicine at home, you will be taught how to prepare and give this medicine. Use exactly as directed. Take your medicine at regular intervals. Do not take your medicine more often than directed. It is important that you put your used needles and syringes in a special sharps container. Do not put them in a trash can. If you do not have a sharps container, call your pharmacist or healthcare provider to get one. A special MedGuide will be given to you by the pharmacist with each prescription and refill. Be sure to read this information carefully each time. Talk to your pediatrician regarding the use of this medicine in children. While this medicine may be used in children as young as 1 year for selected conditions, precautions do apply. Overdosage: If you think you have taken too much of this medicine contact a poison control center or emergency room at once. NOTE: This medicine is only for you. Do not share this medicine with others. What if I miss a dose? If you miss a dose, take it as soon as you can. If it is almost time for your next dose, take only that dose. Do not take double or extra doses. What may interact with this medicine? Do not take this medicine with any of the following medications: -epoetin alfa This list may not describe all possible interactions. Give your health care provider a list of all the medicines, herbs,  non-prescription drugs, or dietary supplements you use. Also tell them if you smoke, drink alcohol, or use illegal drugs. Some items may interact with your medicine. What should I watch for while using this medicine? Your condition will be monitored carefully while you are receiving this medicine. You may need blood work done while you are taking this medicine. What side effects may I notice from receiving this medicine? Side effects that you should report to your doctor or health care professional as soon as possible: -allergic reactions like skin rash, itching or hives, swelling of the face, lips, or tongue -breathing problems -changes in vision -chest pain -confusion, trouble speaking or understanding -feeling faint or lightheaded, falls -high blood pressure -muscle aches or pains -pain, swelling, warmth in the leg -rapid weight gain -severe headaches -sudden numbness or weakness of the face, arm or leg -trouble walking, dizziness, loss of balance or coordination -seizures (convulsions) -swelling of the ankles, feet, hands -unusually weak or tired Side effects that usually do not require medical attention (report to your doctor or health care professional if they continue or are bothersome): -diarrhea -fever, chills (flu-like symptoms) -headaches -nausea, vomiting -redness, stinging, or swelling at site where injected This list may not describe all possible side effects. Call your doctor for medical advice about side effects. You may report side effects to FDA at  1-800-FDA-1088. Where should I keep my medicine? Keep out of the reach of children. Store in a refrigerator between 2 and 8 degrees C (36 and 46 degrees F). Do not freeze. Do not shake. Throw away any unused portion if using a single-dose vial. Throw away any unused medicine after the expiration date. NOTE: This sheet is a summary. It may not cover all possible information. If you have questions about this medicine, talk  to your doctor, pharmacist, or health care provider.  2018 Elsevier/Gold Standard (2016-01-26 19:52:26) Romiplostim injection What is this medicine? ROMIPLOSTIM (roe mi PLOE stim) helps your body make more platelets. This medicine is used to treat low platelets caused by chronic idiopathic thrombocytopenic purpura (ITP). This medicine may be used for other purposes; ask your health care provider or pharmacist if you have questions. COMMON BRAND NAME(S): Nplate What should I tell my health care provider before I take this medicine? They need to know if you have any of these conditions: -cancer or myelodysplastic syndrome -low blood counts, like low white cell, platelet, or red cell counts -take medicines that treat or prevent blood clots -an unusual or allergic reaction to romiplostim, mannitol, other medicines, foods, dyes, or preservatives -pregnant or trying to get pregnant -breast-feeding How should I use this medicine? This medicine is for injection under the skin. It is given by a health care professional in a hospital or clinic setting. A special MedGuide will be given to you before your injection. Read this information carefully each time. Talk to your pediatrician regarding the use of this medicine in children. Special care may be needed. Overdosage: If you think you have taken too much of this medicine contact a poison control center or emergency room at once. NOTE: This medicine is only for you. Do not share this medicine with others. What if I miss a dose? It is important not to miss your dose. Call your doctor or health care professional if you are unable to keep an appointment. What may interact with this medicine? Interactions are not expected. This list may not describe all possible interactions. Give your health care provider a list of all the medicines, herbs, non-prescription drugs, or dietary supplements you use. Also tell them if you smoke, drink alcohol, or use illegal  drugs. Some items may interact with your medicine. What should I watch for while using this medicine? Your condition will be monitored carefully while you are receiving this medicine. Visit your prescriber or health care professional for regular checks on your progress and for the needed blood tests. It is important to keep all appointments. What side effects may I notice from receiving this medicine? Side effects that you should report to your doctor or health care professional as soon as possible: -allergic reactions like skin rash, itching or hives, swelling of the face, lips, or tongue -shortness of breath, chest pain, swelling in a leg -unusual bleeding or bruising Side effects that usually do not require medical attention (report to your doctor or health care professional if they continue or are bothersome): -dizziness -headache -muscle aches -pain in arms and legs -stomach pain -trouble sleeping This list may not describe all possible side effects. Call your doctor for medical advice about side effects. You may report side effects to FDA at 1-800-FDA-1088. Where should I keep my medicine? This drug is given in a hospital or clinic and will not be stored at home. NOTE: This sheet is a summary. It may not cover all possible information. If you  have questions about this medicine, talk to your doctor, pharmacist, or health care provider.  2018 Elsevier/Gold Standard (2008-02-05 15:13:04)

## 2018-04-14 ENCOUNTER — Encounter (HOSPITAL_COMMUNITY): Payer: Self-pay | Admitting: *Deleted

## 2018-04-14 ENCOUNTER — Emergency Department (HOSPITAL_COMMUNITY)
Admission: EM | Admit: 2018-04-14 | Discharge: 2018-04-14 | Disposition: A | Discharge status: Home / Self Care | Payer: Medicare Other | Attending: Emergency Medicine | Admitting: Emergency Medicine

## 2018-04-14 ENCOUNTER — Emergency Department (HOSPITAL_COMMUNITY): Payer: Medicare Other

## 2018-04-14 ENCOUNTER — Other Ambulatory Visit: Payer: Self-pay

## 2018-04-14 DIAGNOSIS — N184 Chronic kidney disease, stage 4 (severe): Secondary | ICD-10-CM | POA: Insufficient documentation

## 2018-04-14 DIAGNOSIS — J90 Pleural effusion, not elsewhere classified: Secondary | ICD-10-CM | POA: Diagnosis not present

## 2018-04-14 DIAGNOSIS — Z8572 Personal history of non-Hodgkin lymphomas: Secondary | ICD-10-CM | POA: Diagnosis not present

## 2018-04-14 DIAGNOSIS — Z79899 Other long term (current) drug therapy: Secondary | ICD-10-CM | POA: Diagnosis not present

## 2018-04-14 DIAGNOSIS — Z7902 Long term (current) use of antithrombotics/antiplatelets: Secondary | ICD-10-CM | POA: Diagnosis not present

## 2018-04-14 DIAGNOSIS — I2510 Atherosclerotic heart disease of native coronary artery without angina pectoris: Secondary | ICD-10-CM | POA: Diagnosis not present

## 2018-04-14 DIAGNOSIS — Z794 Long term (current) use of insulin: Secondary | ICD-10-CM | POA: Diagnosis not present

## 2018-04-14 DIAGNOSIS — Z8546 Personal history of malignant neoplasm of prostate: Secondary | ICD-10-CM | POA: Insufficient documentation

## 2018-04-14 DIAGNOSIS — R509 Fever, unspecified: Secondary | ICD-10-CM | POA: Diagnosis not present

## 2018-04-14 DIAGNOSIS — R1033 Periumbilical pain: Secondary | ICD-10-CM | POA: Diagnosis not present

## 2018-04-14 DIAGNOSIS — E1122 Type 2 diabetes mellitus with diabetic chronic kidney disease: Secondary | ICD-10-CM | POA: Diagnosis not present

## 2018-04-14 DIAGNOSIS — R6883 Chills (without fever): Secondary | ICD-10-CM | POA: Diagnosis present

## 2018-04-14 DIAGNOSIS — I130 Hypertensive heart and chronic kidney disease with heart failure and stage 1 through stage 4 chronic kidney disease, or unspecified chronic kidney disease: Secondary | ICD-10-CM | POA: Diagnosis not present

## 2018-04-14 DIAGNOSIS — Z87891 Personal history of nicotine dependence: Secondary | ICD-10-CM | POA: Diagnosis not present

## 2018-04-14 DIAGNOSIS — I5030 Unspecified diastolic (congestive) heart failure: Secondary | ICD-10-CM | POA: Insufficient documentation

## 2018-04-14 DIAGNOSIS — Z9861 Coronary angioplasty status: Secondary | ICD-10-CM | POA: Insufficient documentation

## 2018-04-14 DIAGNOSIS — K5730 Diverticulosis of large intestine without perforation or abscess without bleeding: Secondary | ICD-10-CM | POA: Diagnosis not present

## 2018-04-14 DIAGNOSIS — R42 Dizziness and giddiness: Secondary | ICD-10-CM | POA: Diagnosis not present

## 2018-04-14 DIAGNOSIS — K4090 Unilateral inguinal hernia, without obstruction or gangrene, not specified as recurrent: Secondary | ICD-10-CM | POA: Diagnosis not present

## 2018-04-14 LAB — URINALYSIS, ROUTINE W REFLEX MICROSCOPIC
Bilirubin Urine: NEGATIVE
Glucose, UA: NEGATIVE mg/dL
Ketones, ur: NEGATIVE mg/dL
Leukocytes, UA: NEGATIVE
Nitrite: NEGATIVE
Protein, ur: 100 mg/dL — AB
Specific Gravity, Urine: 1.015 (ref 1.005–1.030)
pH: 5 (ref 5.0–8.0)

## 2018-04-14 LAB — RESPIRATORY PANEL BY PCR

## 2018-04-14 LAB — DIFFERENTIAL
BASOS ABS: 0 10*3/uL (ref 0.0–0.1)
BLASTS: 0 %
Band Neutrophils: 0 %
Basophils Relative: 0 %
EOS PCT: 0 %
Eosinophils Absolute: 0 10*3/uL (ref 0.0–0.5)
LYMPHS ABS: 0.8 10*3/uL (ref 0.7–4.0)
Lymphocytes Relative: 28 %
MYELOCYTES: 0 %
Metamyelocytes Relative: 0 %
Monocytes Absolute: 0.1 10*3/uL (ref 0.1–1.0)
Monocytes Relative: 4 %
NEUTROS PCT: 68 %
Neutro Abs: 1.8 10*3/uL (ref 1.7–7.7)
Other: 0 %
Promyelocytes Relative: 0 %
nRBC: 0 /100 WBC

## 2018-04-14 LAB — CBC
HCT: 32.9 % — ABNORMAL LOW (ref 39.0–52.0)
Hemoglobin: 9 g/dL — ABNORMAL LOW (ref 13.0–17.0)
MCH: 23 pg — ABNORMAL LOW (ref 26.0–34.0)
MCHC: 27.4 g/dL — ABNORMAL LOW (ref 30.0–36.0)
MCV: 83.9 fL (ref 80.0–100.0)
Platelets: 136 10*3/uL — ABNORMAL LOW (ref 150–400)
RBC: 3.92 MIL/uL — ABNORMAL LOW (ref 4.22–5.81)
RDW: 27.3 % — ABNORMAL HIGH (ref 11.5–15.5)
WBC: 2.7 10*3/uL — ABNORMAL LOW (ref 4.0–10.5)
nRBC: 0 % (ref 0.0–0.2)

## 2018-04-14 LAB — BASIC METABOLIC PANEL
Anion gap: 6 (ref 5–15)
BUN: 31 mg/dL — ABNORMAL HIGH (ref 8–23)
CO2: 24 mmol/L (ref 22–32)
Calcium: 8.5 mg/dL — ABNORMAL LOW (ref 8.9–10.3)
Chloride: 108 mmol/L (ref 98–111)
Creatinine, Ser: 2.02 mg/dL — ABNORMAL HIGH (ref 0.61–1.24)
GFR calc Af Amer: 33 mL/min — ABNORMAL LOW (ref >60)
GFR calc non Af Amer: 28 mL/min — ABNORMAL LOW (ref >60)
Glucose, Bld: 151 mg/dL — ABNORMAL HIGH (ref 70–99)
Potassium: 4.4 mmol/L (ref 3.5–5.1)
Sodium: 138 mmol/L (ref 135–145)

## 2018-04-14 LAB — LIPASE, BLOOD: Lipase: 25 U/L (ref 11–51)

## 2018-04-14 LAB — HEPATIC FUNCTION PANEL
ALT: 17 U/L (ref 0–44)
AST: 17 U/L (ref 15–41)
Albumin: 3.4 g/dL — ABNORMAL LOW (ref 3.5–5.0)
Alkaline Phosphatase: 51 U/L (ref 38–126)
Bilirubin, Direct: 0.1 mg/dL (ref 0.0–0.2)
Indirect Bilirubin: 0.5 mg/dL (ref 0.3–0.9)
Total Bilirubin: 0.6 mg/dL (ref 0.3–1.2)
Total Protein: 7.1 g/dL (ref 6.5–8.1)

## 2018-04-14 LAB — LACTIC ACID, PLASMA: Lactic Acid, Venous: 1.6 mmol/L (ref 0.5–1.9)

## 2018-04-14 LAB — MAGNESIUM: Magnesium: 1.8 mg/dL (ref 1.7–2.4)

## 2018-04-14 MED ORDER — VANCOMYCIN HCL 10 G IV SOLR
1750.0000 mg | Freq: Once | INTRAVENOUS | Status: AC
  Administered 2018-04-14: 1750 mg via INTRAVENOUS
  Filled 2018-04-14: qty 1750

## 2018-04-14 MED ORDER — ACETAMINOPHEN 325 MG PO TABS
650.0000 mg | ORAL_TABLET | Freq: Once | ORAL | Status: AC
  Administered 2018-04-14: 650 mg via ORAL
  Filled 2018-04-14: qty 2

## 2018-04-14 MED ORDER — VANCOMYCIN HCL 10 G IV SOLR: 1250.0000 mg | INTRAVENOUS | Status: DC

## 2018-04-14 MED ORDER — ONDANSETRON HCL 4 MG/2ML IJ SOLN
4.0000 mg | Freq: Once | INTRAMUSCULAR | Status: AC
  Administered 2018-04-14: 4 mg via INTRAVENOUS
  Filled 2018-04-14: qty 2

## 2018-04-14 MED ORDER — SODIUM CHLORIDE 0.9 % IV SOLN
2.0000 g | Freq: Once | INTRAVENOUS | Status: AC
  Administered 2018-04-14: 2 g via INTRAVENOUS
  Filled 2018-04-14: qty 2

## 2018-04-14 MED ORDER — DOXYCYCLINE HYCLATE 100 MG PO CAPS: 100.0000 mg | ORAL_CAPSULE | Freq: Two times a day (BID) | ORAL | 0 refills | Status: DC

## 2018-04-14 NOTE — ED Notes (Signed)
PT has a temp of 101.9 Dr.Kohut notified.

## 2018-04-14 NOTE — ED Notes (Signed)
Pt in waiting room bathroom throwing up.

## 2018-04-14 NOTE — ED Notes (Signed)
Pt ambulated, Pt had steady gait with assistance of a walker.

## 2018-04-14 NOTE — ED Triage Notes (Signed)
Pt in c/o dizziness and chills that started this morning, also reports some upper abdominal/chest pain that is mild, unsure of fever at home, denies cough or congestion

## 2018-04-14 NOTE — ED Provider Notes (Signed)
[facility] EMERGENCY DEPARTMENT Provider Note   CSN: [id number] Arrival date & time: 04/14/18  1343     History   Chief Complaint Chief Complaint  Patient presents with  . Dizziness    HPI Ricardo Watkins is a 84 y.o. male.  HPI   84 year old male with chills, abdominal pain and nausea.  He woke up this morning in his usual state of health.  He had acute onset of shaking chills around noon.  Generally weak.  Vague periumbilical abdominal pain.  Very nauseated.  Feels like if he would vomit he would feel better but has not actually vomited.  No urinary complaints.  No acute respiratory complaints. Had tylenol around 1pm.   Past Medical History:  Diagnosis Date  . Adenomatous colon polyp   . CAD (coronary artery disease)    30% LAD Stenosis, 70% ramus intermedius stenosis, treated with PTCA and angioplasty by Dr Albertine Patricia 2004  . Cataract    right eye  . CHF (congestive heart failure) ([place])   . Cough, persistent 11/04/2015  . Deficiency anemia 04/19/2014  . Diverticulosis   . DM (diabetes mellitus) ([place])   . DVT (deep venous thrombosis) ([place])    secondary to surgery  . Dyslipidemia   . Dyspnea   . Hyperlipidemia   . Hypertension   . Inguinal hernia    right  . 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 Microcytic anemia 07/07/2015  . 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 Non Hodgkin's lymphoma ([place])   . Pleural effusion, left 11/04/2015  . Prostate CA ([place]) 09/10/2011   Gleason 3+3 R, 3+4 L lobe May 2007 Rx Radioactive seed implants Dr [name]  . PVD (peripheral vascular disease) ([place])    rt renal artery stent  . Renal insufficiency   . Thrombocytopenia ([place])   . 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 Thyroid nodule    left lower lobe (annual monitoring).  . Vitamin D deficiency     Patient  Active Problem List   Diagnosis Date Noted  . Chronic neutropenia ([place]) 03/02/2018  . CHF (congestive heart failure) ([place]) 02/19/2018  . Upper airway cough syndrome 10/27/2017  . Abnormal CT of the chest 07/27/2017  . Flu-like symptoms 06/02/2017  . Elevated lactic acid level 06/02/2017  . Acute kidney injury superimposed on chronic kidney disease ([place]) 06/02/2017  . CKD (chronic kidney disease), stage IV ([place]) 12/16/2016  . Bilateral carotid artery stenosis 12/11/2016  . Dyslipidemia 12/11/2016  . Anemia in chronic kidney disease 07/01/2016  . Chronic ITP (idiopathic thrombocytopenia) ([facility]) 07/01/2016  . B12 deficiency anemia   . Chest tube in place   . Acute diastolic CHF (congestive heart failure) ([place]) 04/16/2016  . Acute respiratory failure with hypoxia ([place]) 04/14/2016  . Empyema lung ([place])   . Type 2 diabetes mellitus ([place]) 04/04/2016  . Empyema ([place]) 04/03/2016  . Thyroid nodule 03/03/2016  . Sepsis ([place]) 02/17/2016  . DOE (dyspnea on exertion) 01/27/2016  . Pulmonary edema with congestive heart failure ([place]) 11/12/2015  . Cough, persistent 11/04/2015  . Pleural effusion, left 11/04/2015  . Anorexia 08/08/2015  . Microcytic anemia 07/07/2015  . Pancytopenia ([place]) 04/08/2015  . Diarrhea 04/08/2015  . Encounter for chemotherapy management 04/02/2015  . Neutropenia ([place]) 02/18/2015  . 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  Deficiency anemia 04/19/2014  . Thrombocytopenia ([place]) 04/19/2014  . Vitamin B12 deficiency 04/19/2014  . Chronic renal failure, stage 3 (moderate) ([place]) 04/19/2014  . Macrocytic anemia 03/20/2013  . Monocytosis 03/20/2013  . Thrombotic stroke ([place]) 09/10/2011  . Prostate CA ([place]) 09/10/2011  . Inguinal hernia   . Colon polyps   . CAD (coronary artery disease) 08/26/2010  . HTN (hypertension) 08/26/2010  . Murmur 08/26/2010  . MURMUR 08/26/2010  . History of lymphoma 08/25/2010  . Diabetes ([place]) 08/25/2010  .  DYSLIPIDEMIA 08/25/2010  . THROMBOCYTOPENIA 08/25/2010  . Essential hypertension 08/25/2010  . Coronary atherosclerosis 08/25/2010  . PVD 08/25/2010    Past Surgical History:  Procedure Laterality Date  . APPENDECTOMY     patient ?  Marland Kitchen CARDIAC CATHETERIZATION    . CHEST TUBE INSERTION Left 02/10/2016   Procedure: INSERTION PLEURAL DRAINAGE CATHETER;  Surgeon: [name], MD;  Location: [facility];  Service: Thoracic;  Laterality: Left;  . CHEST TUBE INSERTION Left 04/08/2016   Procedure: CHEST TUBE INSERTION;  Surgeon: [name], MD;  Location: [facility];  Service: Thoracic;  Laterality: Left;  . COLONOSCOPY    . CORONARY ANGIOPLASTY    . CYSTOURETHROSCOPY     ROBOTIC ARM NUCLETRON SEED IMPLANTATION OF PROSTATE  . EXPLORATORY LAPAROTOMY     For evaluation of lymphoma  . REMOVAL OF PLEURAL DRAINAGE CATHETER Left 04/08/2016   Procedure: REMOVAL OF PLEURAL DRAINAGE CATHETER;  Surgeon: [name], MD;  Location: [facility];  Service: Thoracic;  Laterality: Left;        Home Medications    Prior to Admission medications   Medication Sig Start Date End Date Taking? Authorizing Provider  acetaminophen (TYLENOL) 500 MG tablet Take 1,000 mg by mouth every 6 (six) hours as needed for mild pain, moderate pain or headache.     [provider]  clopidogrel (PLAVIX) 75 MG tablet TAKE 1 TABLET BY MOUTH ONCE A DAY WITH BREAKFAST 04/10/18   [name], MD  furosemide (LASIX) 40 MG tablet Take 40mg  daily by mouth. Except excess swelling or weight gain take an additional 40mg  daily 02/27/18   Lendon Colonel, NP  gabapentin (NEURONTIN) 100 MG capsule TAKE 1 CAPSULE BY MOUTH AT BEDTIME 03/20/18   [name], MD  Insulin Glargine (LANTUS SOLOSTAR) 100 UNIT/ML Solostar Pen INJECT 25 UNITS EVERY NIGHT AT BEDTIME Patient taking differently: Inject 25 Units into the skin every morning.  06/09/16   [name], MD  loperamide (IMODIUM) 2 MG capsule Take 2 mg by mouth as  needed for diarrhea or loose stools.    [provider]  meclizine (ANTIVERT) 12.5 MG tablet Take 1 tablet (12.5 mg total) by mouth 3 (three) times daily as needed for dizziness. 06/27/15   [name], MD  metoprolol succinate (TOPROL-XL) 100 MG 24 hr tablet TAKE 1 TABLET BY MOUTH ONCE DAILY Patient taking differently: Take 100 mg by mouth daily.  12/28/17   [name], MD  Multiple Vitamin (MULTIVITAMIN WITH MINERALS) TABS tablet Take 1 tablet by mouth at bedtime.    [provider]  NON FORMULARY Inject 1 Dose into the vein once a week. Platelet booster    [provider]  OVER THE COUNTER MEDICATION Place 1 drop into both eyes daily as needed (dry eyes). Over the counter lubricating eye drop    [provider]  tamsulosin (FLOMAX) 0.4 MG CAPS capsule TAKE 1 CAPSULE BY MOUTH ONCE DAILY 04/11/18  [name], MD  traMADol (ULTRAM) 50 MG tablet Take 1 tablet (50 mg total) by mouth every 6 (six) hours as needed for moderate pain. 01/03/18   [name], MD    Family History Family History  Problem Relation Age of Onset  . Lymphoma Sister   . Prostate cancer Brother   . Breast cancer Sister   . Diabetes Brother   . Diabetes Sister   . Heart disease Brother   . Asthma Son     Social History Social History   Tobacco Use  . Smoking status: Former Smoker    Packs/day: 0.50    Years: 20.00    Pack years: 10.00    Types: Pipe, Cigarettes    Last attempt to quit: 06/21/1958    Years since quitting: 59.8  . Smokeless tobacco: Never Used  Substance Use Topics  . Alcohol use: No    Alcohol/week: 0.0 standard drinks  . Drug use: No     Allergies   Glucophage [metformin hydrochloride]; Zetia [ezetimibe]; Fenofibrate; and Niacin-lovastatin er   Review of Systems Review of Systems  All systems reviewed and negative, other than as noted in HPI.  Physical Exam Updated Vital Signs BP (!) 143/60   Pulse (!) 106   Temp 98.7 F  (37.1 C) (Oral)   Resp (!) 26   SpO2 96%   Physical Exam  Constitutional: He appears well-developed and well-nourished. No distress.  HENT:  Head: Normocephalic and atraumatic.  Eyes: Conjunctivae are normal. Right eye exhibits no discharge. Left eye exhibits no discharge.  Neck: Neck supple.  Cardiovascular: Normal rate, regular rhythm and normal heart sounds. Exam reveals no gallop and no friction rub.  No murmur heard. Pulmonary/Chest: Effort normal and breath sounds normal. No respiratory distress.  Abdominal: Soft. He exhibits no distension. There is tenderness. A hernia is present.  Mild periumbilical and lower abdominal tenderness. Large R inguinal hernia which I cannot reduce. No overlying skin changes.   Musculoskeletal: He exhibits no edema or tenderness.  Neurological: He is alert.  Skin: Skin is warm and dry.  Psychiatric: He has a normal mood and affect. His behavior is normal. Thought content normal.  Nursing note and vitals reviewed.    ED Treatments / Results  Labs (all labs ordered are listed, but only abnormal results are displayed) Labs Reviewed  BASIC METABOLIC PANEL - Abnormal; Notable for the following components:      Result Value   Glucose, Bld 151 (*)    BUN 31 (*)    Creatinine, Ser 2.02 (*)    Calcium 8.5 (*)    GFR calc non Af Amer 28 (*)    GFR calc Af Amer 33 (*)    All other components within normal limits  CBC - Abnormal; Notable for the following components:   WBC 2.7 (*)    RBC 3.92 (*)    Hemoglobin 9.0 (*)    HCT 32.9 (*)    MCH 23.0 (*)    MCHC 27.4 (*)    RDW 27.3 (*)    Platelets 136 (*)    All other components within normal limits  URINALYSIS, ROUTINE W REFLEX MICROSCOPIC - Abnormal; Notable for the following components:   Hgb urine dipstick SMALL (*)    Protein, ur 100 (*)    Bacteria, UA RARE (*)    All other components within normal limits  HEPATIC FUNCTION PANEL - Abnormal; Notable for the following components:   Albumin  3.4 (*)  All other components within normal limits  CULTURE, BLOOD (ROUTINE X 2)  CULTURE, BLOOD (ROUTINE X 2)  RESPIRATORY PANEL BY PCR  LACTIC ACID, PLASMA  LIPASE, BLOOD  MAGNESIUM  DIFFERENTIAL  DIFFERENTIAL  CBG MONITORING, ED    EKG EKG Interpretation  Date/Time:  Friday April 14 2018 13:54:31 EDT Ventricular Rate:  104 PR Interval:  162 QRS Duration: 112 QT Interval:  368 QTC Calculation: 483 R Axis:   -38 Text Interpretation:  Critical Test Result: Arrhythmia Sinus tachycardia with frequent and consecutive Premature ventricular complexes Left axis deviation Septal infarct , age undetermined ST & T wave abnormality, consider lateral ischemia Abnormal ECG Confirmed by Virgel Manifold [phone]) on 04/14/2018 3:31:33 PM   Radiology Ct Abdomen Pelvis Wo Contrast  Result Date: 04/14/2018 CLINICAL DATA:  84 y/o M; dizziness and chills starting this morning. Upper abdominal/chest pain. EXAM: CT ABDOMEN AND PELVIS WITHOUT CONTRAST TECHNIQUE: Multidetector CT imaging of the abdomen and pelvis was performed following the standard protocol without IV contrast. COMPARISON:  06/02/2017 CT of the abdomen and pelvis. FINDINGS: Lower chest: Small right pleural effusion and trace left pleural effusion. Previously identified nodules in the lung bases are no longer evident, likely having been infectious/inflammatory. Smooth interlobular septal thickening. Opacity of the dependent right lung base. Coronary artery calcific atherosclerosis. Hepatobiliary: Scattered liver calcifications compatible with prior granulomatous disease. No additional focal liver lesion. Normal gallbladder. No biliary ductal dilatation. Pancreas: Unremarkable. No pancreatic ductal dilatation or surrounding inflammatory changes. Spleen: Normal in size without focal abnormality. Adrenals/Urinary Tract: Adrenal glands are unremarkable. Tiny nonobstructing stones in the right kidney. Scattered small kidney hypodensities, likely  cysts, measuring up to 17 mm at the right interpolar kidney. No hydronephrosis. Normal bladder. Stomach/Bowel: Stomach is within normal limits. Normal appendix/appendix stump. No evidence of bowel wall thickening, distention, or inflammatory changes. Sigmoid diverticulosis without findings of acute diverticulitis. Vascular/Lymphatic: Aortic atherosclerosis. Right renal artery stent. No enlarged abdominal or pelvic lymph nodes. Reproductive: Brachytherapy seeds. Other: Large right and small left inguinal hernias containing fat. Musculoskeletal: No fracture is seen. Mild lumbar levocurvature. Lumbar spondylosis with prominent facet arthropathy. IMPRESSION: 1. No acute process identified as explanation for abdominal pain. 2. Small right pleural effusion and trace left pleural effusion. 3. Dependent right lower lobe opacity, probably atelectasis, less likely pneumonia. 4. Smooth interlobular septal thickening of the lungs, probably mild interstitial pulmonary edema. 5. Sigmoid diverticulosis without findings of acute diverticulitis. 6. Stable large right and small left inguinal hernias containing fat. 7. Aortic Atherosclerosis (ICD10-I70.0). Coronary artery calcification. Electronically Signed   By: Kristine Garbe M.D.   On: 04/14/2018 19:01   Dg Chest 2 View  Result Date: 04/16/2018 CLINICAL DATA:  Daughter stated, they called from here to say he has some kind of infection that showed up later from Friday. Told to come back for different antibiotic. Hx of CAD, CHF, diabetes, DVT, HTN, pleural effusion. Pt states he is having no CP, SOB, N/V/D. EXAM: CHEST - 2 VIEW COMPARISON:  04/14/2018 FINDINGS: Cardiac silhouette top-normal in size. No mediastinal or hilar masses. No evidence of adenopathy. Lungs are clear.  No pleural effusion or pneumothorax. Skeletal structures are intact. IMPRESSION: No active cardiopulmonary disease. Electronically Signed   By: Lajean Manes M.D.   On: 04/16/2018 11:26   Dg  Chest 2 View  Result Date: 04/14/2018 CLINICAL DATA:  Fever EXAM: CHEST - 2 VIEW COMPARISON:  02/19/2018 FINDINGS: Small pleural effusions. No focal consolidation. Stable cardiomediastinal silhouette with aortic atherosclerosis. Mild central vascular congestion.  No pneumothorax. IMPRESSION: 1. Small pleural effusions without focal consolidation 2. Borderline cardiomegaly with central vascular congestion Electronically Signed   By: Donavan Foil M.D.   On: 04/14/2018 18:16    Procedures Procedures (including critical care time)  Medications Ordered in ED Medications  ceFEPIme (MAXIPIME) 2 g in sodium chloride 0.9 % 100 mL IVPB (has no administration in time range)  ondansetron (ZOFRAN) injection 4 mg (has no administration in time range)     Initial Impression / Assessment and Plan / ED Course  I have reviewed the triage vital signs and the nursing notes.  Pertinent labs & imaging results that were available during my care of the patient were reviewed by me and considered in my medical decision making (see chart for details).    84 year old male with fever.  Unclear source.  Aside from the fever/chills he has had abdominal pain today.  He says periumbilical and he does have some tenderness.  Also has a large inguinal hernia which I cannot reduce.  He states that he has had this for 65 years though he is never been able to reduce it.  He has declined surgical intervention multiple times previously. There are no overlying skin changes.  He is currently feeling better.  No vomiting.  Renal function appears to be at baseline.  CBC noted but seems to be within his typical parameters as well. Viral illness? He has no acute respiratory complaints. Given his age and comrobidities, will cover with abx.    I think it would be more prudent to admit him for observation. I don't feel great about discharging a 84 year old with his medical history that is febrile and complaining of rigors. His ED work-up has  been fairly reassuring though. Shared decision making. He is pretty insist on going home. Return precautions were discussed with wife and multiple family members. Low threshold for return. Will make sure he can ambulate and see how he feels prior to DC.   9:42 PM Pt ambulated. Now sitting on the side of the bed and says he is ready to go home.   Final Clinical Impressions(s) / ED Diagnoses   Final diagnoses:  Febrile illness  Periumbilical abdominal pain    ED Discharge Orders    None       Virgel Manifold, MD 04/16/18 1630

## 2018-04-14 NOTE — Progress Notes (Signed)
Pharmacy Antibiotic Note  Ricardo Watkins is a 84 y.o. male admitted on 04/14/2018 with sepsis. Pt presents with c/o dizziness and chills beginning this morning. Pharmacy has been consulted for Vancomycin dosing. Pt received one dose of Cefepime in ED, will follow-up for further gram-negative coverage. WBC - 2.7, afebrile; Scr-2.02   Plan: Vancomycin 1750mg  IV x1, then Vancomycin 1250 mg IV q24h Cefepime 2g x1, ordered in ED. Follow/up Gram-negative coverage Monitor and adjust per renal fx, clinical status, C&S, vanc troughs as needed     Temp (24hrs), Avg:98.7 F (37.1 C), Min:98.7 F (37.1 C), Max:98.7 F (37.1 C)  Recent Labs  Lab 04/13/18 1430 04/14/18 1408  WBC 2.7* 2.7*  CREATININE  --  2.02*    CrCl cannot be calculated (Unknown ideal weight.).    Allergies  Allergen Reactions  . Glucophage [Metformin Hydrochloride] Other (See Comments)    Chest pain  . Zetia [Ezetimibe] Other (See Comments)    weakness  . Fenofibrate Rash  . Niacin-Lovastatin Er Rash    Antimicrobials this admission: Cefepime 10/25 x1 in ED Vancomycin 10/25 >>   Dose adjustments this admission: N/A  Microbiology results: Pending  Thank you for allowing pharmacy to be a part of this patient's care.  Tyson Babinski 04/14/2018 4:17 PM

## 2018-04-14 NOTE — ED Notes (Signed)
Patient transported to CT 

## 2018-04-15 ENCOUNTER — Telehealth (HOSPITAL_BASED_OUTPATIENT_CLINIC_OR_DEPARTMENT_OTHER): Payer: Self-pay | Admitting: Emergency Medicine

## 2018-04-15 LAB — BLOOD CULTURE ID PANEL (REFLEXED)

## 2018-04-16 ENCOUNTER — Encounter (HOSPITAL_COMMUNITY): Payer: Self-pay | Admitting: Emergency Medicine

## 2018-04-16 ENCOUNTER — Other Ambulatory Visit: Payer: Self-pay

## 2018-04-16 ENCOUNTER — Emergency Department (HOSPITAL_COMMUNITY)
Admission: EM | Admit: 2018-04-16 | Discharge: 2018-04-16 | Disposition: A | Discharge status: Home / Self Care | Payer: Medicare Other | Attending: Emergency Medicine | Admitting: Emergency Medicine

## 2018-04-16 ENCOUNTER — Emergency Department (HOSPITAL_COMMUNITY): Payer: Medicare Other

## 2018-04-16 ENCOUNTER — Telehealth (HOSPITAL_BASED_OUTPATIENT_CLINIC_OR_DEPARTMENT_OTHER): Payer: Self-pay | Admitting: *Deleted

## 2018-04-16 DIAGNOSIS — R109 Unspecified abdominal pain: Secondary | ICD-10-CM | POA: Insufficient documentation

## 2018-04-16 DIAGNOSIS — I2510 Atherosclerotic heart disease of native coronary artery without angina pectoris: Secondary | ICD-10-CM | POA: Insufficient documentation

## 2018-04-16 DIAGNOSIS — R110 Nausea: Secondary | ICD-10-CM | POA: Diagnosis not present

## 2018-04-16 DIAGNOSIS — R5383 Other fatigue: Secondary | ICD-10-CM | POA: Insufficient documentation

## 2018-04-16 DIAGNOSIS — R05 Cough: Secondary | ICD-10-CM | POA: Insufficient documentation

## 2018-04-16 DIAGNOSIS — Z8546 Personal history of malignant neoplasm of prostate: Secondary | ICD-10-CM | POA: Insufficient documentation

## 2018-04-16 DIAGNOSIS — Z7902 Long term (current) use of antithrombotics/antiplatelets: Secondary | ICD-10-CM | POA: Diagnosis not present

## 2018-04-16 DIAGNOSIS — R799 Abnormal finding of blood chemistry, unspecified: Secondary | ICD-10-CM | POA: Diagnosis not present

## 2018-04-16 DIAGNOSIS — I5030 Unspecified diastolic (congestive) heart failure: Secondary | ICD-10-CM | POA: Diagnosis not present

## 2018-04-16 DIAGNOSIS — R531 Weakness: Secondary | ICD-10-CM | POA: Insufficient documentation

## 2018-04-16 DIAGNOSIS — Z87891 Personal history of nicotine dependence: Secondary | ICD-10-CM | POA: Diagnosis not present

## 2018-04-16 DIAGNOSIS — N184 Chronic kidney disease, stage 4 (severe): Secondary | ICD-10-CM | POA: Diagnosis not present

## 2018-04-16 DIAGNOSIS — I130 Hypertensive heart and chronic kidney disease with heart failure and stage 1 through stage 4 chronic kidney disease, or unspecified chronic kidney disease: Secondary | ICD-10-CM | POA: Insufficient documentation

## 2018-04-16 DIAGNOSIS — R067 Sneezing: Secondary | ICD-10-CM | POA: Insufficient documentation

## 2018-04-16 DIAGNOSIS — Z8572 Personal history of non-Hodgkin lymphomas: Secondary | ICD-10-CM | POA: Insufficient documentation

## 2018-04-16 DIAGNOSIS — Z794 Long term (current) use of insulin: Secondary | ICD-10-CM | POA: Diagnosis not present

## 2018-04-16 DIAGNOSIS — D61818 Other pancytopenia: Secondary | ICD-10-CM | POA: Diagnosis not present

## 2018-04-16 DIAGNOSIS — E1122 Type 2 diabetes mellitus with diabetic chronic kidney disease: Secondary | ICD-10-CM | POA: Insufficient documentation

## 2018-04-16 DIAGNOSIS — R0789 Other chest pain: Secondary | ICD-10-CM | POA: Diagnosis not present

## 2018-04-16 LAB — COMPREHENSIVE METABOLIC PANEL
ALT: 14 U/L (ref 0–44)
AST: 30 U/L (ref 15–41)
Albumin: 3.1 g/dL — ABNORMAL LOW (ref 3.5–5.0)
Alkaline Phosphatase: 48 U/L (ref 38–126)
Anion gap: 7 (ref 5–15)
BUN: 32 mg/dL — ABNORMAL HIGH (ref 8–23)
CO2: 20 mmol/L — ABNORMAL LOW (ref 22–32)
Calcium: 8.4 mg/dL — ABNORMAL LOW (ref 8.9–10.3)
Chloride: 108 mmol/L (ref 98–111)
Creatinine, Ser: 2.17 mg/dL — ABNORMAL HIGH (ref 0.61–1.24)
GFR calc Af Amer: 30 mL/min — ABNORMAL LOW (ref >60)
GFR calc non Af Amer: 26 mL/min — ABNORMAL LOW (ref >60)
Glucose, Bld: 193 mg/dL — ABNORMAL HIGH (ref 70–99)
Potassium: 5.1 mmol/L (ref 3.5–5.1)
Sodium: 135 mmol/L (ref 135–145)
Total Bilirubin: 1 mg/dL (ref 0.3–1.2)
Total Protein: 6.8 g/dL (ref 6.5–8.1)

## 2018-04-16 LAB — URINALYSIS, ROUTINE W REFLEX MICROSCOPIC
Bilirubin Urine: NEGATIVE
Glucose, UA: NEGATIVE mg/dL
Ketones, ur: NEGATIVE mg/dL
Leukocytes, UA: NEGATIVE
Nitrite: NEGATIVE
Protein, ur: NEGATIVE mg/dL
Specific Gravity, Urine: 1.011 (ref 1.005–1.030)
pH: 5 (ref 5.0–8.0)

## 2018-04-16 LAB — CBC WITH DIFFERENTIAL/PLATELET
Abs Immature Granulocytes: 0.04 10*3/uL (ref 0.00–0.07)
Basophils Absolute: 0 10*3/uL (ref 0.0–0.1)
Basophils Relative: 0 %
Eosinophils Absolute: 0 10*3/uL (ref 0.0–0.5)
Eosinophils Relative: 0 %
HCT: 29.8 % — ABNORMAL LOW (ref 39.0–52.0)
Hemoglobin: 8.3 g/dL — ABNORMAL LOW (ref 13.0–17.0)
Immature Granulocytes: 1 %
Lymphocytes Relative: 63 %
Lymphs Abs: 1.8 10*3/uL (ref 0.7–4.0)
MCH: 23.3 pg — ABNORMAL LOW (ref 26.0–34.0)
MCHC: 27.9 g/dL — ABNORMAL LOW (ref 30.0–36.0)
MCV: 83.7 fL (ref 80.0–100.0)
Monocytes Absolute: 0.4 10*3/uL (ref 0.1–1.0)
Monocytes Relative: 14 %
Neutro Abs: 0.6 10*3/uL — ABNORMAL LOW (ref 1.7–7.7)
Neutrophils Relative %: 22 %
Platelets: 102 10*3/uL — ABNORMAL LOW (ref 150–400)
RBC: 3.56 MIL/uL — ABNORMAL LOW (ref 4.22–5.81)
RDW: 27.5 % — ABNORMAL HIGH (ref 11.5–15.5)
WBC: 2.9 10*3/uL — ABNORMAL LOW (ref 4.0–10.5)
nRBC: 0 % (ref 0.0–0.2)

## 2018-04-16 LAB — BLOOD CULTURE ID PANEL (REFLEXED)
Acinetobacter baumannii: NOT DETECTED
CANDIDA KRUSEI: NOT DETECTED
Candida albicans: NOT DETECTED
Candida glabrata: NOT DETECTED
Candida parapsilosis: NOT DETECTED
Candida tropicalis: NOT DETECTED
ENTEROCOCCUS SPECIES: NOT DETECTED
Enterobacter cloacae complex: NOT DETECTED
Enterobacteriaceae species: NOT DETECTED
Escherichia coli: NOT DETECTED
HAEMOPHILUS INFLUENZAE: NOT DETECTED
Klebsiella oxytoca: NOT DETECTED
Klebsiella pneumoniae: NOT DETECTED
LISTERIA MONOCYTOGENES: NOT DETECTED
Methicillin resistance: NOT DETECTED
Neisseria meningitidis: NOT DETECTED
PROTEUS SPECIES: NOT DETECTED
PSEUDOMONAS AERUGINOSA: NOT DETECTED
SERRATIA MARCESCENS: NOT DETECTED
STREPTOCOCCUS PNEUMONIAE: NOT DETECTED
STREPTOCOCCUS PYOGENES: NOT DETECTED
Staphylococcus aureus (BCID): NOT DETECTED
Staphylococcus species: DETECTED — AB
Streptococcus agalactiae: NOT DETECTED
Streptococcus species: NOT DETECTED

## 2018-04-16 LAB — LIPASE, BLOOD: Lipase: 25 U/L (ref 11–51)

## 2018-04-16 NOTE — Discharge Instructions (Signed)
Please follow-up with your doctor as scheduled tomorrow.  Please finish doxycycline as prescribed.  Please return the emergency department if you develop any new or worsening symptoms.

## 2018-04-16 NOTE — ED Notes (Signed)
Pt ambulatory to bathroom with cane.

## 2018-04-16 NOTE — ED Provider Notes (Addendum)
Patient returns to the emergency department as he was called for abnormal blood culture.  He was seen here on 04/14/2018 with febrile illness.  Blood cultures came back positive for staph in 1 bottle and positive for strep and strep.  He was started on doxycycline empirically.  He presently complains of vague abdominal discomfort denies cough denies shortness of breath denies urinary symptoms he feels improved over a few days ago.  He ate bacon and eggs this morning.  Exam appears in no distress lungs clear to auscultation heart regular rate and rhythm abdomen soft, nontender.Overnight hospitalization discussed w/ pt and family, He is feeling improved and prefers to keep scheduled appt with his PCP tomorrow   Orlie Dakin, MD 04/16/18 [place], MD 04/16/18 [phone]

## 2018-04-16 NOTE — ED Triage Notes (Signed)
Daughter stated, they called from here to say he has some kind of infection that showed up later from Friday. Told to come back for different antibiotic.

## 2018-04-16 NOTE — ED Notes (Signed)
Patient transported to X-ray 

## 2018-04-16 NOTE — ED Provider Notes (Signed)
[facility] EMERGENCY DEPARTMENT Provider Note   CSN: [id number] Arrival date & time: 04/16/18  0932     History   Chief Complaint Chief Complaint  Patient presents with  . Abnormal Lab    HPI Ricardo Watkins is a 84 y.o. male with history of CAD, diabetes, hypertension, stroke, DVT, Plavix anticoagulation who presents for reevaluation after blood cultures grew positive.  Patient was evaluated on 04/14/2018 for fever.  No source of infection was found and patient did not want to be admitted.  He was sent home with doxycycline.  Patient's blood cultures grew out staph and strep.  Patient has not had a fever over the past couple days, since his ED visit, but continues to feel generally weak and tired.  He has had an intermittent cough and sneezing.  He continues to have some queasiness in his stomach, however he has this at baseline.  Patient has had no problems urinating or stooling.  Patient has not noticed any infected skin lesions.  HPI  Past Medical History:  Diagnosis Date  . Adenomatous colon polyp   . CAD (coronary artery disease)    30% LAD Stenosis, 70% ramus intermedius stenosis, treated with PTCA and angioplasty by Dr Albertine Patricia 2004  . Cataract    right eye  . CHF (congestive heart failure) ([place])   . Cough, persistent 11/04/2015  . Deficiency anemia 04/19/2014  . Diverticulosis   . DM (diabetes mellitus) ([place])   . DVT (deep venous thrombosis) ([place])    secondary to surgery  . Dyslipidemia   . Dyspnea   . Hyperlipidemia   . Hypertension   . Inguinal hernia    right  . 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 Microcytic anemia 07/07/2015  . 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 Non Hodgkin's lymphoma ([place])   . Pleural effusion, left 11/04/2015  . Prostate CA ([place]) 09/10/2011   Gleason 3+3 R, 3+4 L lobe May 2007 Rx Radioactive seed implants Dr [name]  . PVD (peripheral vascular disease) ([place])    rt renal artery  stent  . Renal insufficiency   . Thrombocytopenia ([place])   . 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 Thyroid nodule    left lower lobe (annual monitoring).  . Vitamin D deficiency     Patient Active Problem List   Diagnosis Date Noted  . Chronic neutropenia ([place]) 03/02/2018  . CHF (congestive heart failure) ([place]) 02/19/2018  . Upper airway cough syndrome 10/27/2017  . Abnormal CT of the chest 07/27/2017  . Flu-like symptoms 06/02/2017  . Elevated lactic acid level 06/02/2017  . Acute kidney injury superimposed on chronic kidney disease ([place]) 06/02/2017  . CKD (chronic kidney disease), stage IV ([place]) 12/16/2016  . Bilateral carotid artery stenosis 12/11/2016  . Dyslipidemia 12/11/2016  . Anemia in chronic kidney disease 07/01/2016  . Chronic ITP (idiopathic thrombocytopenia) ([facility]) 07/01/2016  . B12 deficiency anemia   . Chest tube in place   . Acute diastolic CHF (congestive heart failure) ([place]) 04/16/2016  . Acute respiratory failure with hypoxia ([place]) 04/14/2016  . Empyema lung ([place])   . Type 2 diabetes mellitus ([place]) 04/04/2016  . Empyema ([place]) 04/03/2016  . Thyroid nodule 03/03/2016  . Sepsis ([place]) 02/17/2016  . DOE (dyspnea on exertion) 01/27/2016  . Pulmonary edema with congestive heart failure (  [place]) 11/12/2015  . Cough, persistent 11/04/2015  . Pleural effusion, left 11/04/2015  . Anorexia 08/08/2015  . Microcytic anemia 07/07/2015  . Pancytopenia ([place]) 04/08/2015  . Diarrhea 04/08/2015  . Encounter for chemotherapy management 04/02/2015  . Neutropenia ([place]) 02/18/2015  . 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 Deficiency anemia 04/19/2014  . Thrombocytopenia ([place]) 04/19/2014  . Vitamin B12 deficiency 04/19/2014  . Chronic renal failure, stage 3 (moderate) ([place]) 04/19/2014  . Macrocytic anemia 03/20/2013  .  Monocytosis 03/20/2013  . Thrombotic stroke ([place]) 09/10/2011  . Prostate CA ([place]) 09/10/2011  . Inguinal hernia   . Colon polyps   . CAD (coronary artery disease) 08/26/2010  . HTN (hypertension) 08/26/2010  . Murmur 08/26/2010  . MURMUR 08/26/2010  . History of lymphoma 08/25/2010  . Diabetes ([place]) 08/25/2010  . DYSLIPIDEMIA 08/25/2010  . THROMBOCYTOPENIA 08/25/2010  . Essential hypertension 08/25/2010  . Coronary atherosclerosis 08/25/2010  . PVD 08/25/2010    Past Surgical History:  Procedure Laterality Date  . APPENDECTOMY     patient ?  Marland Kitchen CARDIAC CATHETERIZATION    . CHEST TUBE INSERTION Left 02/10/2016   Procedure: INSERTION PLEURAL DRAINAGE CATHETER;  Surgeon: [name], MD;  Location: [facility];  Service: Thoracic;  Laterality: Left;  . CHEST TUBE INSERTION Left 04/08/2016   Procedure: CHEST TUBE INSERTION;  Surgeon: [name], MD;  Location: [facility];  Service: Thoracic;  Laterality: Left;  . COLONOSCOPY    . CORONARY ANGIOPLASTY    . CYSTOURETHROSCOPY     ROBOTIC ARM NUCLETRON SEED IMPLANTATION OF PROSTATE  . EXPLORATORY LAPAROTOMY     For evaluation of lymphoma  . REMOVAL OF PLEURAL DRAINAGE CATHETER Left 04/08/2016   Procedure: REMOVAL OF PLEURAL DRAINAGE CATHETER;  Surgeon: [name], MD;  Location: [facility];  Service: Thoracic;  Laterality: Left;        Home Medications    Prior to Admission medications   Medication Sig Start Date End Date Taking? Authorizing Provider  acetaminophen (TYLENOL) 500 MG tablet Take 1,000 mg by mouth every 6 (six) hours as needed for mild pain, moderate pain or headache.    Yes [provider]  clopidogrel (PLAVIX) 75 MG tablet TAKE 1 TABLET BY MOUTH ONCE A DAY WITH BREAKFAST Patient taking differently: Take 75 mg by mouth daily.  04/10/18  Yes [name], MD  doxycycline (VIBRAMYCIN) 100 MG capsule Take 1 capsule (100 mg total) by mouth 2 (two) times daily. 04/14/18  Yes Virgel Manifold, MD  furosemide  (LASIX) 40 MG tablet Take 40mg  daily by mouth. Except excess swelling or weight gain take an additional 40mg  daily Patient taking differently: Take 40 mg by mouth daily. take an additional 40mg  daily for excess swelling or weight gain 02/27/18  Yes Lendon Colonel, NP  gabapentin (NEURONTIN) 100 MG capsule TAKE 1 CAPSULE BY MOUTH AT BEDTIME Patient taking differently: Take 100 mg by mouth at bedtime.  03/20/18  Yes [name], MD  Insulin Glargine (LANTUS SOLOSTAR) 100 UNIT/ML Solostar Pen INJECT 25 UNITS EVERY NIGHT AT BEDTIME Patient taking differently: Inject 25 Units into the skin every morning.  06/09/16  Yes [name], MD  loperamide (IMODIUM) 2 MG capsule Take 2 mg by mouth as needed for diarrhea or loose stools.   Yes [provider]  metoprolol succinate (TOPROL-XL) 100 MG 24 hr tablet TAKE 1 TABLET BY MOUTH ONCE DAILY Patient taking differently: Take 100 mg by mouth daily.  12/28/17  Yes [name], MD  Multiple Vitamin (MULTIVITAMIN WITH MINERALS) TABS tablet Take 1 tablet by mouth every morning.    Yes [provider]  NON FORMULARY Inject 1 Dose into the vein once a week. Platelet booster   Yes [provider]  OVER THE COUNTER MEDICATION Place 1 drop into both eyes daily as needed (dry eyes). Over the counter lubricating eye drop   Yes [provider]  tamsulosin (FLOMAX) 0.4 MG CAPS capsule TAKE 1 CAPSULE BY MOUTH ONCE DAILY Patient taking differently: Take 0.4 mg by mouth daily.  04/11/18  Yes [name], MD  traMADol (ULTRAM) 50 MG tablet Take 1 tablet (50 mg total) by mouth every 6 (six) hours as needed for moderate pain. 01/03/18  Yes [name], MD  meclizine (ANTIVERT) 12.5 MG tablet Take 1 tablet (12.5 mg total) by mouth 3 (three) times daily as needed for dizziness. Patient not taking: Reported on 04/14/2018 06/27/15   [name], MD    Family History Family History  Problem Relation Age of Onset    . Lymphoma Sister   . Prostate cancer Brother   . Breast cancer Sister   . Diabetes Brother   . Diabetes Sister   . Heart disease Brother   . Asthma Son     Social History Social History   Tobacco Use  . Smoking status: Former Smoker    Packs/day: 0.50    Years: 20.00    Pack years: 10.00    Types: Pipe, Cigarettes    Last attempt to quit: 06/21/1958    Years since quitting: 59.8  . Smokeless tobacco: Never Used  Substance Use Topics  . Alcohol use: No    Alcohol/week: 0.0 standard drinks  . Drug use: No     Allergies   Glucophage [metformin hydrochloride]; Zetia [ezetimibe]; Fenofibrate; and Niacin-lovastatin er   Review of Systems Review of Systems  Constitutional: Positive for fatigue. Negative for chills and fever.  HENT: Positive for sneezing. Negative for facial swelling and sore throat.   Respiratory: Positive for cough. Negative for shortness of breath.   Cardiovascular: Negative for chest pain.  Gastrointestinal: Positive for nausea. Negative for abdominal pain and vomiting.  Genitourinary: Negative for dysuria.  Musculoskeletal: Negative for back pain.  Skin: Negative for rash and wound.  Neurological: Positive for weakness. Negative for headaches.  Psychiatric/Behavioral: The patient is not nervous/anxious.      Physical Exam Updated Vital Signs BP 138/60 (BP Location: Right Arm)   Pulse 82   Temp 98.3 F (36.8 C) (Oral)   Resp 16   Ht 5\' 8"  (1.727 m)   Wt 85.7 kg   SpO2 98%   BMI 28.74 kg/m   Physical Exam  Constitutional: He appears well-developed and well-nourished. No distress.  HENT:  Head: Normocephalic and atraumatic.  Mouth/Throat: Oropharynx is clear and moist. No oropharyngeal exudate.  Eyes: Pupils are equal, round, and reactive to light. Conjunctivae are normal. Right eye exhibits no discharge. Left eye exhibits no discharge. No scleral icterus.  Neck: Normal range of motion. Neck supple. No thyromegaly present.   Cardiovascular: Normal rate, regular rhythm, normal heart sounds and intact distal pulses. Exam reveals no gallop and no friction rub.  No murmur heard. Pulmonary/Chest: Effort normal. No stridor. No respiratory distress. He has no wheezes. He has rales (bilaterally).  Abdominal: Soft. Bowel sounds are normal. He exhibits no distension. There is no tenderness. There is no rebound and no guarding.  Musculoskeletal: He  exhibits no edema.  Lymphadenopathy:    He has no cervical adenopathy.  Neurological: He is alert. Coordination normal.  Skin: Skin is warm and dry. No rash noted. He is not diaphoretic. No pallor.  No notable infected skin lesions  Psychiatric: He has a normal mood and affect.  Nursing note and vitals reviewed.    ED Treatments / Results  Labs (all labs ordered are listed, but only abnormal results are displayed) Labs Reviewed  COMPREHENSIVE METABOLIC PANEL - Abnormal; Notable for the following components:      Result Value   CO2 20 (*)    Glucose, Bld 193 (*)    BUN 32 (*)    Creatinine, Ser 2.17 (*)    Calcium 8.4 (*)    Albumin 3.1 (*)    GFR calc non Af Amer 26 (*)    GFR calc Af Amer 30 (*)    All other components within normal limits  CBC WITH DIFFERENTIAL/PLATELET - Abnormal; Notable for the following components:   WBC 2.9 (*)    RBC 3.56 (*)    Hemoglobin 8.3 (*)    HCT 29.8 (*)    MCH 23.3 (*)    MCHC 27.9 (*)    RDW 27.5 (*)    Platelets 102 (*)    Neutro Abs 0.6 (*)    All other components within normal limits  URINALYSIS, ROUTINE W REFLEX MICROSCOPIC - Abnormal; Notable for the following components:   Hgb urine dipstick SMALL (*)    Bacteria, UA RARE (*)    All other components within normal limits  CULTURE, BLOOD (ROUTINE X 2)  CULTURE, BLOOD (ROUTINE X 2)  LIPASE, BLOOD  PATHOLOGIST SMEAR REVIEW    EKG None  Radiology Ct Abdomen Pelvis Wo Contrast  Result Date: 04/14/2018 CLINICAL DATA:  84 y/o M; dizziness and chills starting  this morning. Upper abdominal/chest pain. EXAM: CT ABDOMEN AND PELVIS WITHOUT CONTRAST TECHNIQUE: Multidetector CT imaging of the abdomen and pelvis was performed following the standard protocol without IV contrast. COMPARISON:  06/02/2017 CT of the abdomen and pelvis. FINDINGS: Lower chest: Small right pleural effusion and trace left pleural effusion. Previously identified nodules in the lung bases are no longer evident, likely having been infectious/inflammatory. Smooth interlobular septal thickening. Opacity of the dependent right lung base. Coronary artery calcific atherosclerosis. Hepatobiliary: Scattered liver calcifications compatible with prior granulomatous disease. No additional focal liver lesion. Normal gallbladder. No biliary ductal dilatation. Pancreas: Unremarkable. No pancreatic ductal dilatation or surrounding inflammatory changes. Spleen: Normal in size without focal abnormality. Adrenals/Urinary Tract: Adrenal glands are unremarkable. Tiny nonobstructing stones in the right kidney. Scattered small kidney hypodensities, likely cysts, measuring up to 17 mm at the right interpolar kidney. No hydronephrosis. Normal bladder. Stomach/Bowel: Stomach is within normal limits. Normal appendix/appendix stump. No evidence of bowel wall thickening, distention, or inflammatory changes. Sigmoid diverticulosis without findings of acute diverticulitis. Vascular/Lymphatic: Aortic atherosclerosis. Right renal artery stent. No enlarged abdominal or pelvic lymph nodes. Reproductive: Brachytherapy seeds. Other: Large right and small left inguinal hernias containing fat. Musculoskeletal: No fracture is seen. Mild lumbar levocurvature. Lumbar spondylosis with prominent facet arthropathy. IMPRESSION: 1. No acute process identified as explanation for abdominal pain. 2. Small right pleural effusion and trace left pleural effusion. 3. Dependent right lower lobe opacity, probably atelectasis, less likely pneumonia. 4. Smooth  interlobular septal thickening of the lungs, probably mild interstitial pulmonary edema. 5. Sigmoid diverticulosis without findings of acute diverticulitis. 6. Stable large right and small left inguinal hernias containing  fat. 7. Aortic Atherosclerosis (ICD10-I70.0). Coronary artery calcification. Electronically Signed   By: Kristine Garbe M.D.   On: 04/14/2018 19:01   Dg Chest 2 View  Result Date: 04/16/2018 CLINICAL DATA:  Daughter stated, they called from here to say he has some kind of infection that showed up later from Friday. Told to come back for different antibiotic. Hx of CAD, CHF, diabetes, DVT, HTN, pleural effusion. Pt states he is having no CP, SOB, N/V/D. EXAM: CHEST - 2 VIEW COMPARISON:  04/14/2018 FINDINGS: Cardiac silhouette top-normal in size. No mediastinal or hilar masses. No evidence of adenopathy. Lungs are clear.  No pleural effusion or pneumothorax. Skeletal structures are intact. IMPRESSION: No active cardiopulmonary disease. Electronically Signed   By: Lajean Manes M.D.   On: 04/16/2018 11:26   Dg Chest 2 View  Result Date: 04/14/2018 CLINICAL DATA:  Fever EXAM: CHEST - 2 VIEW COMPARISON:  02/19/2018 FINDINGS: Small pleural effusions. No focal consolidation. Stable cardiomediastinal silhouette with aortic atherosclerosis. Mild central vascular congestion. No pneumothorax. IMPRESSION: 1. Small pleural effusions without focal consolidation 2. Borderline cardiomegaly with central vascular congestion Electronically Signed   By: Donavan Foil M.D.   On: 04/14/2018 18:16    Procedures Procedures (including critical care time)  Medications Ordered in ED Medications - No data to display   Initial Impression / Assessment and Plan / ED Course  I have reviewed the triage vital signs and the nursing notes.  Pertinent labs & imaging results that were available during my care of the patient were reviewed by me and considered in my medical decision making (see chart for  details).     Patient presenting after he was called for positive blood cultures.  Patient has felt fatigued and weak at home with some nausea, however after his stay in the ED today, he is feeling much better.  Labs are stable from 2 days ago.  Blood cultures are most likely skin contaminant, as staph and strep grew out in different culture vials.  Abdomen is soft and nontender.  CT 2 days ago showed no acute findings.  Patient has a follow-up appointment with his PCP scheduled for tomorrow.  He would like to go home.  He will finish doxycycline.  Considering labs are reasonably stable from 2 days ago and he is feeling much better, feel patient stable for discharge with strict return precautions.  Patient vitals stable throughout ED course and discharged in satisfactory condition.  Patient also evaluated by Dr. Winfred Leeds, my attending, who guided the patient's management and agrees with plan.  Final Clinical Impressions(s) / ED Diagnoses   Final diagnoses:  Weakness    ED Discharge Orders    None       Frederica Kuster, PA-C 04/16/18 [place], MD 04/16/18 [phone]

## 2018-04-17 ENCOUNTER — Other Ambulatory Visit: Payer: Self-pay

## 2018-04-17 ENCOUNTER — Ambulatory Visit (INDEPENDENT_AMBULATORY_CARE_PROVIDER_SITE_OTHER): Payer: Medicare Other | Admitting: Family Medicine

## 2018-04-17 ENCOUNTER — Encounter: Payer: Self-pay | Admitting: Family Medicine

## 2018-04-17 VITALS — BP 132/64 | HR 68 | Temp 97.8°F | Resp 16 | Ht 69.0 in | Wt 193.0 lb

## 2018-04-17 DIAGNOSIS — R509 Fever, unspecified: Secondary | ICD-10-CM | POA: Diagnosis not present

## 2018-04-17 DIAGNOSIS — I6522 Occlusion and stenosis of left carotid artery: Secondary | ICD-10-CM

## 2018-04-17 LAB — CULTURE, BLOOD (ROUTINE X 2): Special Requests: ADEQUATE

## 2018-04-17 MED ORDER — DOXYCYCLINE HYCLATE 100 MG PO TABS: 100.0000 mg | ORAL_TABLET | Freq: Two times a day (BID) | ORAL | 0 refills | Status: DC

## 2018-04-17 NOTE — Progress Notes (Signed)
Subjective:    Patient ID: Ricardo Watkins, male    DOB: 03/30/1931, 84 y.o.   MRN: [id number] Patient is a very pleasant 84 year old Caucasian male with a history of coronary artery disease, congestive heart failure with an ejection fraction of 30 to 35%, and myelodysplastic syndrome.  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  Friday he suddenly developed a fever to 102.  He reported rigors and nausea.  He went to the emergency room.  CT scan of the abdomen showed no specific cause for abdominal discomfort although it did show opacity in the right lower lobe of the lung consistent with atelectasis versus less likely pneumonia.  Urinalysis was unremarkable.  CBC showed his stable chronic pancytopenia.  Blood cultures were obtained and the patient was discharged home on doxycycline.  He went back to the hospital on Sunday after he was called back due to blood cultures growing Streptococcus viridans as well as coagulase-negative staph.  This was thought to be most likely skin contamination.  He states he feels much better.  He denies any cough or shortness of breath or chest pain.  He denies any hemoptysis or sputum production.  He denies any further abdominal pain.  He denies any nausea vomiting or diarrhea.  He denies any dysuria urgency or frequency.  He denies any rash.  He denies any sore throat otalgia or sinus pain or rhinorrhea.  He denies any neck stiffness. Past Medical History:  Diagnosis Date  . Adenomatous colon polyp   . CAD (coronary artery disease)    30% LAD Stenosis, 70% ramus intermedius stenosis, treated with PTCA and angioplasty by Dr Albertine Patricia 2004  . Cataract    right eye  . CHF (congestive heart failure) ([place])   . Cough, persistent 11/04/2015  . Deficiency anemia 04/19/2014  . Diverticulosis   . DM (diabetes mellitus) ([place])   . DVT (deep venous thrombosis) ([place])    secondary to surgery  . Dyslipidemia   . Dyspnea   .  Hyperlipidemia   . Hypertension   . Inguinal hernia    right  . 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 Microcytic anemia 07/07/2015  . 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 Non Hodgkin's lymphoma ([place])   . Pleural effusion, left 11/04/2015  . Prostate CA ([place]) 09/10/2011   Gleason 3+3 R, 3+4 L lobe May 2007 Rx Radioactive seed implants Dr [name]  . PVD (peripheral vascular disease) ([place])    rt renal artery stent  . Renal insufficiency   . Thrombocytopenia ([place])   . Thrombotic stroke ([place]) 09/10/2011   January 18, 2011 infarct genu & post limb R internal capsule - acute; previous lacunar infarcts/extensive white matter dis  . Thyroid nodule    left lower lobe (annual monitoring).  . Vitamin D deficiency    Past Surgical History:  Procedure Laterality Date  . APPENDECTOMY     patient ?  Marland Kitchen CARDIAC CATHETERIZATION    . CHEST TUBE INSERTION Left 02/10/2016   Procedure: INSERTION PLEURAL DRAINAGE CATHETER;  Surgeon: [name], MD;  Location: [facility];  Service: Thoracic;  Laterality: Left;  . CHEST TUBE INSERTION Left 04/08/2016   Procedure: CHEST TUBE INSERTION;  Surgeon: [name], MD;  Location: [facility];  Service: Thoracic;  Laterality: Left;  . COLONOSCOPY    . CORONARY ANGIOPLASTY    . CYSTOURETHROSCOPY  ROBOTIC ARM NUCLETRON SEED IMPLANTATION OF PROSTATE  . EXPLORATORY LAPAROTOMY     For evaluation of lymphoma  . REMOVAL OF PLEURAL DRAINAGE CATHETER Left 04/08/2016   Procedure: REMOVAL OF PLEURAL DRAINAGE CATHETER;  Surgeon: [name], MD;  Location: [facility] OR;  Service: Thoracic;  Laterality: Left;   Current Outpatient Medications on File Prior to Visit  Medication Sig Dispense Refill  . acetaminophen (TYLENOL) 500 MG tablet Take 1,000 mg by mouth every 6 (six) hours as needed for mild pain, moderate pain or headache.     . clopidogrel (PLAVIX) 75 MG tablet TAKE 1 TABLET BY MOUTH ONCE A DAY WITH BREAKFAST (Patient taking  differently: Take 75 mg by mouth daily. ) 90 tablet 3  . doxycycline (VIBRAMYCIN) 100 MG capsule Take 1 capsule (100 mg total) by mouth 2 (two) times daily. 14 capsule 0  . furosemide (LASIX) 40 MG tablet Take 40mg  daily by mouth. Except excess swelling or weight gain take an additional 40mg  daily (Patient taking differently: Take 40 mg by mouth daily. take an additional 40mg  daily for excess swelling or weight gain) 100 tablet 3  . gabapentin (NEURONTIN) 100 MG capsule TAKE 1 CAPSULE BY MOUTH AT BEDTIME (Patient taking differently: Take 100 mg by mouth at bedtime. ) 90 capsule 3  . Insulin Glargine (LANTUS SOLOSTAR) 100 UNIT/ML Solostar Pen INJECT 25 UNITS EVERY NIGHT AT BEDTIME (Patient taking differently: Inject 25 Units into the skin every morning. ) 5 pen 3  . loperamide (IMODIUM) 2 MG capsule Take 2 mg by mouth as needed for diarrhea or loose stools.    . meclizine (ANTIVERT) 12.5 MG tablet Take 1 tablet (12.5 mg total) by mouth 3 (three) times daily as needed for dizziness. 30 tablet 0  . metoprolol succinate (TOPROL-XL) 100 MG 24 hr tablet TAKE 1 TABLET BY MOUTH ONCE DAILY (Patient taking differently: Take 100 mg by mouth daily. ) 90 tablet 3  . Multiple Vitamin (MULTIVITAMIN WITH MINERALS) TABS tablet Take 1 tablet by mouth every morning.     . NON FORMULARY Inject 1 Dose into the vein once a week. Platelet booster    . OVER THE COUNTER MEDICATION Place 1 drop into both eyes daily as needed (dry eyes). Over the counter lubricating eye drop    . tamsulosin (FLOMAX) 0.4 MG CAPS capsule TAKE 1 CAPSULE BY MOUTH ONCE DAILY (Patient taking differently: Take 0.4 mg by mouth daily. ) 90 capsule 3  . traMADol (ULTRAM) 50 MG tablet Take 1 tablet (50 mg total) by mouth every 6 (six) hours as needed for moderate pain. 60 tablet 0   Current Facility-Administered Medications on File Prior to Visit  Medication Dose Route Frequency Provider Last Rate Last Dose  . cyanocobalamin ((VITAMIN B-12)) injection  1,000 mcg  1,000 mcg Subcutaneous Q30 days [name], MD   1,000 mcg at 03/17/18 1003  . romiPLOStim (NPLATE) injection 170 mcg  170 mcg Subcutaneous Weekly [name], Ni, MD   170 mcg at 11/25/16 1345  . romiPLOStim (NPLATE) injection 770 mcg  9 mcg/kg Subcutaneous Weekly Heath Lark, MD   770 mcg at 03/16/18 1549   Allergies  Allergen Reactions  . Glucophage [Metformin Hydrochloride] Other (See Comments)    Chest pain  . Zetia [Ezetimibe] Other (See Comments)    weakness  . Fenofibrate Rash  . Niacin-Lovastatin Er Rash   Social History   Socioeconomic History  . Marital status: Married    Spouse name: Not on file  . Number  of children: 4  . Years of education: Not on file  . Highest education level: Not on file  Occupational History  . Occupation: retired  Scientific laboratory technician  . Financial resource strain: Not on file  . Food insecurity:    Worry: Not on file    Inability: Not on file  . Transportation needs:    Medical: Not on file    Non-medical: Not on file  Tobacco Use  . Smoking status: Former Smoker    Packs/day: 0.50    Years: 20.00    Pack years: 10.00    Types: Pipe, Cigarettes    Last attempt to quit: 06/21/1958    Years since quitting: 59.8  . Smokeless tobacco: Never Used  Substance and Sexual Activity  . Alcohol use: No    Alcohol/week: 0.0 standard drinks  . Drug use: No  . Sexual activity: Yes    Partners: Female  Lifestyle  . Physical activity:    Days per week: Not on file    Minutes per session: Not on file  . Stress: Not on file  Relationships  . Social connections:    Talks on phone: Not on file    Gets together: Not on file    Attends religious service: Not on file    Active member of club or organization: Not on file    Attends meetings of clubs or organizations: Not on file    Relationship status: Not on file  . Intimate partner violence:    Fear of current or ex partner: Not on file    Emotionally abused: Not on file    Physically  abused: Not on file    Forced sexual activity: Not on file  Other Topics Concern  . Not on file  Social History Narrative  . Not on file      Review of Systems  All other systems reviewed and are negative.      Objective:   Physical Exam  Constitutional: He appears well-developed.  Non-toxic appearance. He has a sickly appearance. He does not appear ill. No distress.  Neck: Neck supple. No JVD present.  Cardiovascular: Normal rate.  Murmur heard. Pulmonary/Chest: Effort normal and breath sounds normal. No respiratory distress. He has no wheezes. He has no rales.  Abdominal: Soft. Bowel sounds are normal. He exhibits no distension. There is no tenderness. There is no rebound.  Musculoskeletal: He exhibits edema.  Lymphadenopathy:    He has no cervical adenopathy.  Skin: He is not diaphoretic.  Vitals reviewed.         Assessment & Plan:  Fever, unspecified fever cause  Spent 30 minutes today with the patient and his family reviewing his emergency room notes, his laboratory studies, his CT scan, and conducting his exam.  I suspect that the patient either had a viral illness explaining his isolated fever or possibly early right-sided right lower lobe pneumonia that is being currently treated with doxycycline.  I believe the positive blood cultures likely represent contamination.  He does have a very loud systolic ejection murmur consistent with moderate to severe aortic stenosis.  I believe bacterial endocarditis is less likely however if fever and blood cultures persist, a TEE would be warranted.  However he just had a TTE in September that showed no suggestion of any vegetation.  Obviously this is not very sensitive for endocarditis.  Today the patient looks well and feels much better.  I recommended clinical monitoring and complete a 10-day course of doxycycline for  presumed community-acquired pneumonia

## 2018-04-18 ENCOUNTER — Telehealth: Payer: Self-pay | Admitting: Emergency Medicine

## 2018-04-18 LAB — PATHOLOGIST SMEAR REVIEW

## 2018-04-18 NOTE — Telephone Encounter (Signed)
Post ED Visit - Positive Culture Follow-up  Culture report reviewed by antimicrobial stewardship pharmacist:  []  Elenor Quinones, Pharm.D. []  Heide Guile, Pharm.D., BCPS AQ-ID []  Parks Neptune, Pharm.D., BCPS []  Alycia Rossetti, Pharm.D., BCPS []  [place], Pharm.D., BCPS, AAHIVP []  Legrand Como, Pharm.D., BCPS, AAHIVP [x]  Salome Arnt, PharmD, BCPS []  Johnnette Gourd, PharmD, BCPS []  Hughes Better, PharmD, BCPS []  Leeroy Cha, PharmD  Positive blood culture Treated with doxycycline, organism sensitive to the same and no further patient follow-up is required at this time.  Hazle Nordmann 04/18/2018, 9:38 AM

## 2018-04-19 ENCOUNTER — Inpatient Hospital Stay: Payer: Medicare Other

## 2018-04-19 VITALS — BP 131/62 | HR 72 | Temp 98.1°F | Resp 18

## 2018-04-19 DIAGNOSIS — D696 Thrombocytopenia, unspecified: Secondary | ICD-10-CM

## 2018-04-19 DIAGNOSIS — N183 Chronic kidney disease, stage 3 unspecified: Secondary | ICD-10-CM

## 2018-04-19 DIAGNOSIS — D539 Nutritional anemia, unspecified: Secondary | ICD-10-CM

## 2018-04-19 DIAGNOSIS — D631 Anemia in chronic kidney disease: Secondary | ICD-10-CM

## 2018-04-19 DIAGNOSIS — J9601 Acute respiratory failure with hypoxia: Secondary | ICD-10-CM

## 2018-04-19 DIAGNOSIS — D46C Myelodysplastic syndrome with isolated del(5q) chromosomal abnormality: Secondary | ICD-10-CM

## 2018-04-19 DIAGNOSIS — D693 Immune thrombocytopenic purpura: Secondary | ICD-10-CM | POA: Diagnosis not present

## 2018-04-19 LAB — CBC WITH DIFFERENTIAL/PLATELET
Abs Immature Granulocytes: 0 10*3/uL (ref 0.00–0.07)
BASOS ABS: 0 10*3/uL (ref 0.0–0.1)
BASOS PCT: 0 %
Eosinophils Absolute: 0 10*3/uL (ref 0.0–0.5)
Eosinophils Relative: 0 %
HCT: 26.8 % — ABNORMAL LOW (ref 39.0–52.0)
Hemoglobin: 7.8 g/dL — ABNORMAL LOW (ref 13.0–17.0)
IMMATURE GRANULOCYTES: 0 %
Lymphocytes Relative: 62 %
Lymphs Abs: 1.9 10*3/uL (ref 0.7–4.0)
MCH: 24.1 pg — AB (ref 26.0–34.0)
MCHC: 29.1 g/dL — ABNORMAL LOW (ref 30.0–36.0)
MCV: 83 fL (ref 80.0–100.0)
MONOS PCT: 13 %
Monocytes Absolute: 0.4 10*3/uL (ref 0.1–1.0)
NEUTROS ABS: 0.8 10*3/uL — AB (ref 1.7–7.7)
Neutrophils Relative %: 25 %
PLATELETS: 131 10*3/uL — AB (ref 150–400)
RBC: 3.23 MIL/uL — AB (ref 4.22–5.81)
RDW: 26.8 % — AB (ref 11.5–15.5)
WBC: 3 10*3/uL — AB (ref 4.0–10.5)
nRBC: 0 % (ref 0.0–0.2)

## 2018-04-19 LAB — CULTURE, BLOOD (ROUTINE X 2)
Culture: NO GROWTH
Special Requests: ADEQUATE

## 2018-04-19 MED ORDER — ROMIPLOSTIM INJECTION 500 MCG
9.0000 ug/kg | SUBCUTANEOUS | Status: DC
  Administered 2018-04-19: 790 ug via SUBCUTANEOUS
  Filled 2018-04-19: qty 1

## 2018-04-19 NOTE — Progress Notes (Unsigned)
Talked with patient and family in the lobby regarding hemoglobin 7.8. He and family denies being symptomatic. Instructed to call office for questions or concerns. He is scheduled for lab appt for next week. He and family verbalized understanding. Reviewed with on call Dr. Irene Limbo. Continue to monitor.

## 2018-04-19 NOTE — Progress Notes (Unsigned)
Pt Hgb is 7.8 today. Pt feels fine and vitals are in normal range for him. Pt wants to wait and see what is blood levels look like next week before getting a blood transfusion. Family was told about possible side effects of low Hgb and was informed to call if any were to occur.  Kasandra Knudsen LPN

## 2018-04-20 ENCOUNTER — Observation Stay (HOSPITAL_COMMUNITY)
Admission: EM | Admit: 2018-04-20 | Discharge: 2018-04-22 | Disposition: A | Discharge status: Home / Self Care | Payer: Medicare Other | Attending: Internal Medicine | Admitting: Internal Medicine

## 2018-04-20 ENCOUNTER — Other Ambulatory Visit: Payer: Self-pay

## 2018-04-20 ENCOUNTER — Encounter (HOSPITAL_COMMUNITY): Payer: Self-pay

## 2018-04-20 ENCOUNTER — Emergency Department (HOSPITAL_COMMUNITY): Payer: Medicare Other

## 2018-04-20 DIAGNOSIS — D46C Myelodysplastic syndrome with isolated del(5q) chromosomal abnormality: Secondary | ICD-10-CM | POA: Diagnosis not present

## 2018-04-20 DIAGNOSIS — I10 Essential (primary) hypertension: Secondary | ICD-10-CM | POA: Diagnosis present

## 2018-04-20 DIAGNOSIS — Z8042 Family history of malignant neoplasm of prostate: Secondary | ICD-10-CM | POA: Insufficient documentation

## 2018-04-20 DIAGNOSIS — I130 Hypertensive heart and chronic kidney disease with heart failure and stage 1 through stage 4 chronic kidney disease, or unspecified chronic kidney disease: Secondary | ICD-10-CM | POA: Insufficient documentation

## 2018-04-20 DIAGNOSIS — Z8546 Personal history of malignant neoplasm of prostate: Secondary | ICD-10-CM | POA: Diagnosis not present

## 2018-04-20 DIAGNOSIS — Z8249 Family history of ischemic heart disease and other diseases of the circulatory system: Secondary | ICD-10-CM | POA: Insufficient documentation

## 2018-04-20 DIAGNOSIS — D693 Immune thrombocytopenic purpura: Secondary | ICD-10-CM | POA: Diagnosis present

## 2018-04-20 DIAGNOSIS — I509 Heart failure, unspecified: Secondary | ICD-10-CM

## 2018-04-20 DIAGNOSIS — D708 Other neutropenia: Secondary | ICD-10-CM | POA: Diagnosis not present

## 2018-04-20 DIAGNOSIS — Z794 Long term (current) use of insulin: Secondary | ICD-10-CM | POA: Insufficient documentation

## 2018-04-20 DIAGNOSIS — D61818 Other pancytopenia: Secondary | ICD-10-CM | POA: Diagnosis not present

## 2018-04-20 DIAGNOSIS — E1122 Type 2 diabetes mellitus with diabetic chronic kidney disease: Secondary | ICD-10-CM | POA: Diagnosis not present

## 2018-04-20 DIAGNOSIS — Z86718 Personal history of other venous thrombosis and embolism: Secondary | ICD-10-CM | POA: Diagnosis not present

## 2018-04-20 DIAGNOSIS — Z9221 Personal history of antineoplastic chemotherapy: Secondary | ICD-10-CM | POA: Insufficient documentation

## 2018-04-20 DIAGNOSIS — Z8572 Personal history of non-Hodgkin lymphomas: Secondary | ICD-10-CM | POA: Diagnosis not present

## 2018-04-20 DIAGNOSIS — D631 Anemia in chronic kidney disease: Secondary | ICD-10-CM | POA: Diagnosis present

## 2018-04-20 DIAGNOSIS — N189 Chronic kidney disease, unspecified: Secondary | ICD-10-CM

## 2018-04-20 DIAGNOSIS — I739 Peripheral vascular disease, unspecified: Secondary | ICD-10-CM | POA: Diagnosis not present

## 2018-04-20 DIAGNOSIS — R079 Chest pain, unspecified: Secondary | ICD-10-CM | POA: Diagnosis not present

## 2018-04-20 DIAGNOSIS — I2510 Atherosclerotic heart disease of native coronary artery without angina pectoris: Secondary | ICD-10-CM | POA: Diagnosis present

## 2018-04-20 DIAGNOSIS — Z79899 Other long term (current) drug therapy: Secondary | ICD-10-CM | POA: Insufficient documentation

## 2018-04-20 DIAGNOSIS — Z86010 Personal history of colonic polyps: Secondary | ICD-10-CM | POA: Insufficient documentation

## 2018-04-20 DIAGNOSIS — I255 Ischemic cardiomyopathy: Secondary | ICD-10-CM | POA: Insufficient documentation

## 2018-04-20 DIAGNOSIS — J90 Pleural effusion, not elsewhere classified: Secondary | ICD-10-CM | POA: Insufficient documentation

## 2018-04-20 DIAGNOSIS — N184 Chronic kidney disease, stage 4 (severe): Secondary | ICD-10-CM | POA: Diagnosis not present

## 2018-04-20 DIAGNOSIS — E785 Hyperlipidemia, unspecified: Secondary | ICD-10-CM | POA: Insufficient documentation

## 2018-04-20 DIAGNOSIS — D469 Myelodysplastic syndrome, unspecified: Secondary | ICD-10-CM | POA: Diagnosis present

## 2018-04-20 DIAGNOSIS — Z888 Allergy status to other drugs, medicaments and biological substances status: Secondary | ICD-10-CM | POA: Insufficient documentation

## 2018-04-20 DIAGNOSIS — N179 Acute kidney failure, unspecified: Secondary | ICD-10-CM | POA: Diagnosis not present

## 2018-04-20 DIAGNOSIS — Z7902 Long term (current) use of antithrombotics/antiplatelets: Secondary | ICD-10-CM | POA: Insufficient documentation

## 2018-04-20 DIAGNOSIS — K5790 Diverticulosis of intestine, part unspecified, without perforation or abscess without bleeding: Secondary | ICD-10-CM | POA: Insufficient documentation

## 2018-04-20 DIAGNOSIS — E559 Vitamin D deficiency, unspecified: Secondary | ICD-10-CM | POA: Insufficient documentation

## 2018-04-20 DIAGNOSIS — E1129 Type 2 diabetes mellitus with other diabetic kidney complication: Secondary | ICD-10-CM

## 2018-04-20 DIAGNOSIS — D509 Iron deficiency anemia, unspecified: Secondary | ICD-10-CM | POA: Insufficient documentation

## 2018-04-20 DIAGNOSIS — J189 Pneumonia, unspecified organism: Secondary | ICD-10-CM | POA: Insufficient documentation

## 2018-04-20 DIAGNOSIS — Z825 Family history of asthma and other chronic lower respiratory diseases: Secondary | ICD-10-CM | POA: Insufficient documentation

## 2018-04-20 DIAGNOSIS — D519 Vitamin B12 deficiency anemia, unspecified: Secondary | ICD-10-CM | POA: Diagnosis present

## 2018-04-20 DIAGNOSIS — Z8673 Personal history of transient ischemic attack (TIA), and cerebral infarction without residual deficits: Secondary | ICD-10-CM | POA: Insufficient documentation

## 2018-04-20 DIAGNOSIS — I5043 Acute on chronic combined systolic (congestive) and diastolic (congestive) heart failure: Secondary | ICD-10-CM | POA: Diagnosis present

## 2018-04-20 DIAGNOSIS — D709 Neutropenia, unspecified: Secondary | ICD-10-CM

## 2018-04-20 DIAGNOSIS — E538 Deficiency of other specified B group vitamins: Secondary | ICD-10-CM | POA: Diagnosis not present

## 2018-04-20 DIAGNOSIS — N400 Enlarged prostate without lower urinary tract symptoms: Secondary | ICD-10-CM | POA: Insufficient documentation

## 2018-04-20 DIAGNOSIS — N183 Chronic kidney disease, stage 3 (moderate): Secondary | ICD-10-CM | POA: Diagnosis not present

## 2018-04-20 DIAGNOSIS — Z807 Family history of other malignant neoplasms of lymphoid, hematopoietic and related tissues: Secondary | ICD-10-CM | POA: Insufficient documentation

## 2018-04-20 DIAGNOSIS — E1151 Type 2 diabetes mellitus with diabetic peripheral angiopathy without gangrene: Secondary | ICD-10-CM | POA: Insufficient documentation

## 2018-04-20 DIAGNOSIS — I313 Pericardial effusion (noninflammatory): Secondary | ICD-10-CM | POA: Insufficient documentation

## 2018-04-20 DIAGNOSIS — I701 Atherosclerosis of renal artery: Secondary | ICD-10-CM | POA: Insufficient documentation

## 2018-04-20 DIAGNOSIS — R072 Precordial pain: Secondary | ICD-10-CM | POA: Diagnosis not present

## 2018-04-20 DIAGNOSIS — Z87891 Personal history of nicotine dependence: Secondary | ICD-10-CM | POA: Insufficient documentation

## 2018-04-20 DIAGNOSIS — Z9861 Coronary angioplasty status: Secondary | ICD-10-CM | POA: Insufficient documentation

## 2018-04-20 DIAGNOSIS — Z833 Family history of diabetes mellitus: Secondary | ICD-10-CM | POA: Insufficient documentation

## 2018-04-20 DIAGNOSIS — I350 Nonrheumatic aortic (valve) stenosis: Secondary | ICD-10-CM | POA: Diagnosis not present

## 2018-04-20 DIAGNOSIS — Z803 Family history of malignant neoplasm of breast: Secondary | ICD-10-CM | POA: Insufficient documentation

## 2018-04-20 HISTORY — DX: Immune thrombocytopenic purpura: D69.3

## 2018-04-20 LAB — GLUCOSE, CAPILLARY
GLUCOSE-CAPILLARY: 119 mg/dL — AB (ref 70–99)
Glucose-Capillary: 141 mg/dL — ABNORMAL HIGH (ref 70–99)

## 2018-04-20 LAB — BASIC METABOLIC PANEL
Anion gap: 12 (ref 5–15)
BUN: 32 mg/dL — AB (ref 8–23)
CO2: 21 mmol/L — ABNORMAL LOW (ref 22–32)
Calcium: 8.6 mg/dL — ABNORMAL LOW (ref 8.9–10.3)
Chloride: 102 mmol/L (ref 98–111)
Creatinine, Ser: 1.82 mg/dL — ABNORMAL HIGH (ref 0.61–1.24)
GFR calc Af Amer: 37 mL/min — ABNORMAL LOW (ref >60)
GFR calc non Af Amer: 32 mL/min — ABNORMAL LOW (ref >60)
Glucose, Bld: 260 mg/dL — ABNORMAL HIGH (ref 70–99)
POTASSIUM: 4.1 mmol/L (ref 3.5–5.1)
Sodium: 135 mmol/L (ref 135–145)

## 2018-04-20 LAB — CBC
HEMATOCRIT: 28.5 % — AB (ref 39.0–52.0)
HEMOGLOBIN: 7.9 g/dL — AB (ref 13.0–17.0)
MCH: 23 pg — AB (ref 26.0–34.0)
MCHC: 27.7 g/dL — AB (ref 30.0–36.0)
MCV: 82.8 fL (ref 80.0–100.0)
Platelets: 124 10*3/uL — ABNORMAL LOW (ref 150–400)
RBC: 3.44 MIL/uL — ABNORMAL LOW (ref 4.22–5.81)
RDW: 27.7 % — ABNORMAL HIGH (ref 11.5–15.5)
WBC: 3.5 10*3/uL — ABNORMAL LOW (ref 4.0–10.5)
nRBC: 0.6 % — ABNORMAL HIGH (ref 0.0–0.2)

## 2018-04-20 LAB — I-STAT TROPONIN, ED: Troponin i, poc: 0.02 ng/mL (ref 0.00–0.08)

## 2018-04-20 LAB — PREPARE RBC (CROSSMATCH)

## 2018-04-20 LAB — BRAIN NATRIURETIC PEPTIDE: B Natriuretic Peptide: 575 pg/mL — ABNORMAL HIGH (ref 0.0–100.0)

## 2018-04-20 LAB — TROPONIN I: TROPONIN I: 0.03 ng/mL — AB (ref <0.03)

## 2018-04-20 MED ORDER — ONDANSETRON HCL 4 MG/2ML IJ SOLN
4.0000 mg | Freq: Once | INTRAMUSCULAR | Status: AC
  Administered 2018-04-20: 4 mg via INTRAVENOUS
  Filled 2018-04-20: qty 2

## 2018-04-20 MED ORDER — ADULT MULTIVITAMIN W/MINERALS CH
1.0000 | ORAL_TABLET | Freq: Every day | ORAL | Status: DC
  Administered 2018-04-21: 1 via ORAL
  Filled 2018-04-20: qty 1

## 2018-04-20 MED ORDER — CLOPIDOGREL BISULFATE 75 MG PO TABS
75.0000 mg | ORAL_TABLET | Freq: Every day | ORAL | Status: DC
  Administered 2018-04-21 – 2018-04-22 (×2): 75 mg via ORAL
  Filled 2018-04-20 (×2): qty 1

## 2018-04-20 MED ORDER — FUROSEMIDE 40 MG PO TABS: 40.0000 mg | ORAL_TABLET | Freq: Every day | ORAL | Status: DC

## 2018-04-20 MED ORDER — TRAMADOL HCL 50 MG PO TABS: 50.0000 mg | ORAL_TABLET | Freq: Four times a day (QID) | ORAL | Status: DC | PRN

## 2018-04-20 MED ORDER — ACETAMINOPHEN 325 MG PO TABS
650.0000 mg | ORAL_TABLET | Freq: Four times a day (QID) | ORAL | Status: DC | PRN
  Administered 2018-04-21 – 2018-04-22 (×2): 650 mg via ORAL
  Filled 2018-04-20 (×2): qty 2

## 2018-04-20 MED ORDER — INSULIN ASPART 100 UNIT/ML ~~LOC~~ SOLN
0.0000 [IU] | Freq: Three times a day (TID) | SUBCUTANEOUS | Status: DC
  Administered 2018-04-20: 1 [IU] via SUBCUTANEOUS
  Administered 2018-04-21: 2 [IU] via SUBCUTANEOUS
  Administered 2018-04-21 – 2018-04-22 (×2): 3 [IU] via SUBCUTANEOUS
  Administered 2018-04-22: 1 [IU] via SUBCUTANEOUS
  Filled 2018-04-20 (×5): qty 1

## 2018-04-20 MED ORDER — CYANOCOBALAMIN 1000 MCG/ML IJ SOLN: 1000.0000 ug | INTRAMUSCULAR | Status: DC

## 2018-04-20 MED ORDER — METOPROLOL SUCCINATE ER 100 MG PO TB24
100.0000 mg | ORAL_TABLET | Freq: Every day | ORAL | Status: DC
  Administered 2018-04-21 – 2018-04-22 (×2): 100 mg via ORAL
  Filled 2018-04-20 (×2): qty 1

## 2018-04-20 MED ORDER — FUROSEMIDE 10 MG/ML IJ SOLN
40.0000 mg | Freq: Every day | INTRAMUSCULAR | Status: DC
  Administered 2018-04-20: 40 mg via INTRAVENOUS
  Filled 2018-04-20: qty 4

## 2018-04-20 MED ORDER — TRAMADOL HCL 50 MG PO TABS
50.0000 mg | ORAL_TABLET | Freq: Once | ORAL | Status: AC
  Administered 2018-04-20: 50 mg via ORAL
  Filled 2018-04-20: qty 1

## 2018-04-20 MED ORDER — ACETAMINOPHEN 500 MG PO TABS
1000.0000 mg | ORAL_TABLET | Freq: Once | ORAL | Status: AC
  Administered 2018-04-20: 1000 mg via ORAL
  Filled 2018-04-20: qty 2

## 2018-04-20 MED ORDER — DOXYCYCLINE HYCLATE 100 MG PO TABS
100.0000 mg | ORAL_TABLET | Freq: Two times a day (BID) | ORAL | Status: DC
  Administered 2018-04-20 – 2018-04-22 (×4): 100 mg via ORAL
  Filled 2018-04-20 (×4): qty 1

## 2018-04-20 MED ORDER — GABAPENTIN 100 MG PO CAPS
100.0000 mg | ORAL_CAPSULE | Freq: Every day | ORAL | Status: DC
  Administered 2018-04-20 – 2018-04-21 (×2): 100 mg via ORAL
  Filled 2018-04-20 (×2): qty 1

## 2018-04-20 MED ORDER — TAMSULOSIN HCL 0.4 MG PO CAPS: 0.4000 mg | ORAL_CAPSULE | Freq: Every day | ORAL | Status: DC

## 2018-04-20 MED ORDER — MECLIZINE HCL 12.5 MG PO TABS
12.5000 mg | ORAL_TABLET | Freq: Three times a day (TID) | ORAL | Status: DC | PRN
  Filled 2018-04-20: qty 1

## 2018-04-20 MED ORDER — ADULT MULTIVITAMIN W/MINERALS CH: 1.0000 | ORAL_TABLET | ORAL | Status: DC

## 2018-04-20 MED ORDER — ALBUTEROL SULFATE (2.5 MG/3ML) 0.083% IN NEBU
5.0000 mg | INHALATION_SOLUTION | Freq: Once | RESPIRATORY_TRACT | Status: AC
  Administered 2018-04-20: 5 mg via RESPIRATORY_TRACT
  Filled 2018-04-20: qty 6

## 2018-04-20 MED ORDER — SODIUM CHLORIDE 0.9% IV SOLUTION
Freq: Once | INTRAVENOUS | Status: AC
  Administered 2018-04-20: 19:00:00 via INTRAVENOUS

## 2018-04-20 MED ORDER — INSULIN GLARGINE 100 UNIT/ML ~~LOC~~ SOLN
20.0000 [IU] | SUBCUTANEOUS | Status: DC
  Administered 2018-04-21 – 2018-04-22 (×2): 20 [IU] via SUBCUTANEOUS
  Filled 2018-04-20 (×3): qty 0.2

## 2018-04-20 MED ORDER — TAMSULOSIN HCL 0.4 MG PO CAPS
0.4000 mg | ORAL_CAPSULE | Freq: Every day | ORAL | Status: DC
  Administered 2018-04-21 – 2018-04-22 (×2): 0.4 mg via ORAL
  Filled 2018-04-20 (×2): qty 1

## 2018-04-20 NOTE — ED Notes (Signed)
consultation with cards at the bedside

## 2018-04-20 NOTE — H&P (Addendum)
History and Physical    Ricardo Watkins VHQ:[id number] DOB: 22-Aug-1930 DOA: 04/20/2018  I have briefly reviewed the patient's prior medical records in [place]  PCP: [name], MD  Patient coming from: Home  Chief Complaint: Chest pain  HPI: Ricardo Watkins is a 84 y.o. male with medical history significant of coronary artery disease with ischemic cardiomyopathy, chronic combined CHF with an EF of 30 to 35%, also chronic ITP, vitamin B12 deficiency, MDS followed by [name], chronic kidney disease stage III, who presents to the hospital with chief complaint of chest pain.  This was also associated with some shortness of breath, and his symptoms were with waking up this morning.  Chest pain was midsternal, did not go anywhere.  He denies any nausea or vomiting.  It resolved before arriving to the emergency room.  He denies any palpitations.  Cardiology was consulted and recommended admission for chest pain rule out.  Of note, patient had a febrile illness about 5-6 days ago, presented to the emergency room and was discharged on doxycycline for presumed URI, and during that admission blood cultures were obtained.  His blood cultures turn positive for strep viridans as well as coag negative staph, he was asked to come back to the ED and get surveillance cultures on 10/27 which are negative to date.  He also saw his PCP as an outpatient who recommended to continue the doxycycline.  Currently he no longer has any symptoms related to that ER visit.  For his MDS he is getting vitamin B12 injections monthly as well as Nplate for his thrombocytopenia.  He also has a history of chronic neutropenia due to underlying bone marrow disease, asymptomatic, not in need of prophylactic G-CSF for antibiotic treatment.  Review of Systems: As per HPI otherwise 10 point review of systems negative.   Past Medical History:  Diagnosis Date  . Adenomatous colon polyp   . CAD (coronary artery disease)    30% LAD Stenosis, 70% ramus intermedius stenosis, treated with PTCA and angioplasty by Dr Albertine Patricia 2004  . Cataract    right eye  . CHF (congestive heart failure) ([place])   . Cough, persistent 11/04/2015  . Deficiency anemia 04/19/2014  . Diverticulosis   . DM (diabetes mellitus) ([place])   . DVT (deep venous thrombosis) ([place])    secondary to surgery  . Dyslipidemia   . Dyspnea   . Hyperlipidemia   . Hypertension   . Inguinal hernia    right  . 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 Microcytic anemia 07/07/2015  . 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 Non Hodgkin's lymphoma ([place])   . Pleural effusion, left 11/04/2015  . Prostate CA ([place]) 09/10/2011   Gleason 3+3 R, 3+4 L lobe May 2007 Rx Radioactive seed implants Dr [name]  . PVD (peripheral vascular disease) ([place])    rt renal artery stent  . Renal insufficiency   . Thrombocytopenia ([place])   . Thrombotic stroke ([name]) 09/10/2011   January 18, 2011 infarct genu & post limb R internal capsule - acute; previous lacunar infarcts/extensive white matter dis  . Thyroid nodule    left lower lobe (annual monitoring).  . Vitamin D deficiency     Past Surgical History:  Procedure Laterality Date  . APPENDECTOMY     patient ?  Marland Kitchen CARDIAC CATHETERIZATION    . CHEST TUBE INSERTION Left 02/10/2016   Procedure: INSERTION PLEURAL DRAINAGE CATHETER;  Surgeon: [name], MD;  Location: [facility];  Service: Thoracic;  Laterality: Left;  . CHEST TUBE INSERTION Left 04/08/2016   Procedure: CHEST TUBE INSERTION;  Surgeon: [name], MD;  Location: [facility];  Service: Thoracic;  Laterality: Left;  . COLONOSCOPY    . CORONARY ANGIOPLASTY    . CYSTOURETHROSCOPY     ROBOTIC ARM NUCLETRON SEED IMPLANTATION OF PROSTATE  . EXPLORATORY LAPAROTOMY     For evaluation of lymphoma  . REMOVAL OF PLEURAL DRAINAGE CATHETER Left 04/08/2016   Procedure: REMOVAL OF PLEURAL DRAINAGE CATHETER;  Surgeon: [name], MD;   Location: [facility];  Service: Thoracic;  Laterality: Left;     reports that he quit smoking about 59 years ago. His smoking use included pipe and cigarettes. He has a 10.00 pack-year smoking history. He has never used smokeless tobacco. He reports that he does not drink alcohol or use drugs.  Allergies  Allergen Reactions  . Glucophage [Metformin Hydrochloride] Other (See Comments)    Chest pain  . Zetia [Ezetimibe] Other (See Comments)    weakness  . Fenofibrate Rash  . Niacin-Lovastatin Er Rash    Family History  Problem Relation Age of Onset  . Lymphoma Sister   . Prostate cancer Brother   . Breast cancer Sister   . Diabetes Brother   . Diabetes Sister   . Heart disease Brother   . Asthma Son     Prior to Admission medications   Medication Sig Start Date End Date Taking? Authorizing Provider  acetaminophen (TYLENOL) 500 MG tablet Take 1,000 mg by mouth every 6 (six) hours as needed for mild pain, moderate pain or headache.    Yes [provider]  clopidogrel (PLAVIX) 75 MG tablet TAKE 1 TABLET BY MOUTH ONCE A DAY WITH BREAKFAST Patient taking differently: Take 75 mg by mouth daily.  04/10/18  Yes [name], MD  doxycycline (VIBRAMYCIN) 100 MG capsule Take 1 capsule (100 mg total) by mouth 2 (two) times daily. 04/14/18  Yes Virgel Manifold, MD  furosemide (LASIX) 40 MG tablet Take 40mg  daily by mouth. Except excess swelling or weight gain take an additional 40mg  daily Patient taking differently: Take 40 mg by mouth daily. take an additional 40mg  daily for excess swelling or weight gain 02/27/18  Yes Lendon Colonel, NP  gabapentin (NEURONTIN) 100 MG capsule TAKE 1 CAPSULE BY MOUTH AT BEDTIME Patient taking differently: Take 100 mg by mouth at bedtime.  03/20/18  Yes [name], MD  Insulin Glargine (LANTUS SOLOSTAR) 100 UNIT/ML Solostar Pen INJECT 25 UNITS EVERY NIGHT AT BEDTIME Patient taking differently: Inject 25 Units into the skin every morning.   06/09/16  Yes [name], MD  loperamide (IMODIUM) 2 MG capsule Take 2 mg by mouth as needed for diarrhea or loose stools.   Yes [provider]  meclizine (ANTIVERT) 12.5 MG tablet Take 1 tablet (12.5 mg total) by mouth 3 (three) times daily as needed for dizziness. 06/27/15  Yes [name], MD  metoprolol succinate (TOPROL-XL) 100 MG 24 hr tablet TAKE 1 TABLET BY MOUTH ONCE DAILY Patient taking differently: Take 100 mg by mouth daily.  12/28/17  Yes [name], MD  Multiple Vitamin (MULTIVITAMIN WITH MINERALS) TABS tablet Take 1 tablet by mouth every morning.    Yes [provider]  NON FORMULARY Inject 1 Dose into the vein once a week. Platelet booster   Yes [provider]  OVER THE COUNTER  MEDICATION Place 1 drop into both eyes daily as needed (dry eyes). Over the counter lubricating eye drop   Yes [provider]  tamsulosin (FLOMAX) 0.4 MG CAPS capsule TAKE 1 CAPSULE BY MOUTH ONCE DAILY Patient taking differently: Take 0.4 mg by mouth daily.  04/11/18  Yes [name], MD  traMADol (ULTRAM) 50 MG tablet Take 1 tablet (50 mg total) by mouth every 6 (six) hours as needed for moderate pain. 01/03/18  Yes [name], MD  doxycycline (VIBRA-TABS) 100 MG tablet Take 1 tablet (100 mg total) by mouth 2 (two) times daily. 04/17/18   [name], MD    Physical Exam: Vitals:   04/20/18 1030 04/20/18 1323 04/20/18 1422 04/20/18 1430  BP: (!) 134/59  (!) 173/89 (!) 171/85  Pulse: 71  (!) 101 97  Resp: 20  (!) 25 (!) 24  Temp:  (!) 97.5 F (36.4 C)    TempSrc:  Oral    SpO2: 96%  93% 100%    Constitutional: NAD, calm, comfortable Eyes: PERRL, lids and conjunctivae normal ENMT: Mucous membranes are moist. Posterior pharynx clear of any exudate or lesions.  Neck: normal, supple, no masses, no thyromegaly Respiratory: clear to auscultation bilaterally, no wheezing, no crackles. Normal respiratory effort. No accessory muscle  use.  Cardiovascular: Regular rate and rhythm, 3/6 SEM.  Trace lower extremity edema. 2+ pedal pulses.  Abdomen: no tenderness, no masses palpated.  Musculoskeletal: no clubbing / cyanosis Skin: no rashes, lesions, ulcers. No induration Neurologic: CN 2-12 grossly intact. Strength 5/5 in all 4.  Psychiatric: Normal judgment and insight. Alert and oriented x 3. Normal mood.   Labs on Admission: I have personally reviewed following labs and imaging studies  CBC: Recent Labs  Lab 04/14/18 1408 04/16/18 1130 04/19/18 1442 04/20/18 0852  WBC 2.7* 2.9* 3.0* 3.5*  NEUTROABS 1.8 0.6* 0.8*  --   HGB 9.0* 8.3* 7.8* 7.9*  HCT 32.9* 29.8* 26.8* 28.5*  MCV 83.9 83.7 83.0 82.8  PLT 136* 102* 131* 626*   Basic Metabolic Panel: Recent Labs  Lab 04/14/18 1408 04/14/18 1621 04/16/18 1130 04/20/18 0852  NA 138  --  135 135  K 4.4  --  5.1 4.1  CL 108  --  108 102  CO2 24  --  20* 21*  GLUCOSE 151*  --  193* 260*  BUN 31*  --  32* 32*  CREATININE 2.02*  --  2.17* 1.82*  CALCIUM 8.5*  --  8.4* 8.6*  MG  --  1.8  --   --    GFR: Estimated Creatinine Clearance: 32.5 mL/min (A) (by C-G formula based on SCr of 1.82 mg/dL (H)). Liver Function Tests: Recent Labs  Lab 04/14/18 1621 04/16/18 1130  AST 17 30  ALT 17 14  ALKPHOS 51 48  BILITOT 0.6 1.0  PROT 7.1 6.8  ALBUMIN 3.4* 3.1*   Recent Labs  Lab 04/14/18 1621 04/16/18 1130  LIPASE 25 25   No results for input(s): AMMONIA in the last 168 hours. Coagulation Profile: No results for input(s): INR, PROTIME in the last 168 hours. Cardiac Enzymes: No results for input(s): CKTOTAL, CKMB, CKMBINDEX, TROPONINI in the last 168 hours. BNP (last 3 results) No results for input(s): PROBNP in the last 8760 hours. HbA1C: No results for input(s): HGBA1C in the last 72 hours. CBG: No results for input(s): GLUCAP in the last 168 hours. Lipid Profile: No results for input(s): CHOL, HDL, LDLCALC, TRIG, CHOLHDL, LDLDIRECT in the last  72  hours. Thyroid Function Tests: No results for input(s): TSH, T4TOTAL, FREET4, T3FREE, THYROIDAB in the last 72 hours. Anemia Panel: No results for input(s): VITAMINB12, FOLATE, FERRITIN, TIBC, IRON, RETICCTPCT in the last 72 hours. Urine analysis:    Component Value Date/Time   COLORURINE YELLOW 04/16/2018 [place] 04/16/2018 1219   LABSPEC 1.011 04/16/2018 1219   PHURINE 5.0 04/16/2018 1219   GLUCOSEU NEGATIVE 04/16/2018 1219   HGBUR SMALL (A) 04/16/2018 1219   BILIRUBINUR NEGATIVE 04/16/2018 1219   KETONESUR NEGATIVE 04/16/2018 1219   PROTEINUR NEGATIVE 04/16/2018 1219   NITRITE NEGATIVE 04/16/2018 1219   LEUKOCYTESUR NEGATIVE 04/16/2018 1219     Radiological Exams on Admission: Dg Chest 2 View  Result Date: 04/20/2018 CLINICAL DATA:  Chest pain for several hours EXAM: CHEST - 2 VIEW COMPARISON:  04/16/2018 FINDINGS: Cardiac shadow is stable. Aortic calcifications are again seen. Minimal blunting of the costophrenic angles is noted. No focal infiltrate is seen. No bony abnormality is noted. IMPRESSION: Minimal blunting of the costophrenic angles consistent with mild pleural fluid. No focal infiltrate is seen. Electronically Signed   By: Inez Catalina M.D.   On: 04/20/2018 10:03    EKG: Independently reviewed.  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  Discussed with Dr. Oval Linsey, cycle cardiac enzymes overnight -Further work-up per cards  Myelodysplastic  syndrome/chronic ITP/chronic neutropenia -Patient seen regularly in oncology clinic, and platelets look stable today at 124, white count looks stable at 3.5 he does not appear to be neutropenic -Continue to monitor counts  Recent strep viridans bacteremia as well as coag negative staph bacteremia -That was in the setting of febrile illness, he was placed on doxycycline for presumed URI.  His symptoms have resolved on antibiotics.  He got surveillance blood cultures on 10/27 which are without growth at day 4, still pending.  We will continue to follow until finalized -Continue doxycycline, this was started on 10/26 with plan for 10 days -Given complete resolution of symptoms, isolated positive cultures, negative surveillance, this is likely to be contaminant.  Case discussed with Dr. Linus Salmons with ID  Chronic kidney disease stage IV -Creatinine appears at baseline  Normocytic anemia -Multifactorial, in the setting of MDS, chronic kidney disease, B12 deficiency.  Generally his hemoglobin runs in the 9 range but has been declining in the last month, 7.9 on admission, will transfuse 1 unit of packed red blood cells given chest pain and underlying heart disease  Insulin-dependent diabetes mellitus -Continue home Lantus at a lower dose while hospitalized, add sliding scale insulin  Chronic combined CHF -Has trace lower extremity edema but no significant fluid overload, continue home Lasix  Aortic stenosis -Evaluated by cardiology as an outpatient  Hypertension -Continue metoprolol  BPH -Continue Flomax   DVT prophylaxis: SCDs Code Status: Full code Family Communication: Wife present at bedside Disposition Plan: Admit to telemetry Consults called: Cardiology, Dr. Shela Nevin, MD, PhD [facility] Pager [phone]  If 7PM-7AM, please contact night-coverage [email or web address]  Password [email or web address]  04/20/2018, 4:00 PM

## 2018-04-20 NOTE — ED Notes (Signed)
Patient assisted to BR via wheelchair.

## 2018-04-20 NOTE — ED Notes (Signed)
ED Provider at bedside. 

## 2018-04-20 NOTE — ED Notes (Signed)
ED Provider at bedside. 

## 2018-04-20 NOTE — Consult Note (Signed)
This needed to be copied from mis title of H&P originally under H&P  untitled image   Cardiology Consultation:       Patient ID: Ricardo Watkins  MRN: [id number]; DOB: 08-Sep-1930     Admit date: 04/20/2018  Date of Consult: 04/20/2018     Primary Care Provider: [name], MD  Primary Cardiologist: [name], MD   Primary Electrophysiologist:  None      Chief complaint: Streptococcus viridans as well as coagulase-negative staph  Chest pain.      Patient Profile:       Ricardo Watkins is a 84 y.o. male with a hx of CAD, PTCA and PTCA of ramus intermedius 2004, non Hodgkin's lymphoma, with pleur-X cath secondary to non malignant pl effusions, IDDM, hld, cartid artery disease, stable LICA stenosis, HTN who is being seen today for the evaluation of chest pain at the request of Dr. Zenia Resides.Marland Kitchen      History of Present Illness:       Ricardo Watkins with above hx and hospitalization 02/21/18 for CHF and diuresed. Discharged on lasix 40 mg.  Last echo EF 30-35% mildly dilated LV, G1 DD.  Moderate to severe stenosis of AV,  Moderate TR, mod MR, LA was moderately dilated.  This was a drop in EF from 01/2018 to 02/2018.  The drop was new.  Troponin was 0.04 X 2 at that time.  Stable on last OV 03/15/18.  Last saw oncology for OV and dx of MDS (myelodysplastic syndrome) maybe chemo related has monthly B 12 injections. Chronic neutropenia due to underlying bone marrow disease.    Has been seen in ER 10/25 for dizziness and chills temp 102 blood cultures were drawn, and on the 27th  returned for repeat eval after + blood culture Streptococcus viridans as well as coagulase-negative staph. It was thought was contaminant.  Treated with Doxycycline for presumed community acquired pneumonia.  Today presents to ER with chest tightness.  Some SOB.  Pt woke with tightness, mid sternal with no radiation, no nausea or diaphoresis but mild SOB after he was up and headed to hospital it did  improve some, here it returned and then walking to [place] it improved.   No dizziness, occ palpitations.     EKG I personally reviewed. SR with rare PVC and poor R wave progression in ant leads.  No acute change.      Na 135, K+ 4.1, C02 21, glucose 260, BUN 32, Cr 1.82  Ca+ 8.6  Troponin 0.02  Hgb 7.9 HCT 28.5, WBC 3.5 plts 124.     CXR:  Minimal blunting of the costophrenic angles consistent with mild  pleural fluid. No focal infiltrate is seen.      Resting comfortably now.          Past Medical History:    Diagnosis   Date    .   Adenomatous colon polyp        .   CAD (coronary artery disease)            30% LAD Stenosis, 70% ramus intermedius stenosis, treated with PTCA and angioplasty by Dr Albertine Patricia 2004    .   Cataract            right eye    .   CHF (congestive heart failure) ([place])        .   Cough, persistent   11/04/2015    .   Deficiency anemia   04/19/2014    .  Diverticulosis        .   DM (diabetes mellitus) ([place])        .   DVT (deep venous thrombosis) ([place])            secondary to surgery    .   Dyslipidemia        .   Dyspnea        .   Hyperlipidemia        .   Hypertension        .   Inguinal hernia            right    .   Macrocytic anemia   03/20/2013        Suspect chemo related MDS    .   Microcytic anemia   07/07/2015    .   Monocytosis   03/20/2013        Suspect chemo related MDS    .   Non Hodgkin's lymphoma ([place])        .   Pleural effusion, left   11/04/2015    .   Prostate CA ([place])   09/10/2011        Gleason 3+3 R, 3+4 L lobe May 2007 Rx Radioactive seed implants Dr [name]    .   PVD (peripheral vascular disease) ([place])            rt renal artery stent    .   Renal insufficiency        .   Thrombocytopenia ([place])        .   Thrombotic stroke ([place])   09/10/2011         January 18, 2011 infarct genu & post limb R internal capsule - acute; previous lacunar infarcts/extensive white matter dis    .   Thyroid nodule            left lower lobe (annual monitoring).    .   Vitamin D deficiency                    Past Surgical History:    Procedure   Laterality   Date    .   APPENDECTOMY                patient ?    Marland Kitchen   CARDIAC CATHETERIZATION            .   CHEST TUBE INSERTION   Left   02/10/2016        Procedure: INSERTION PLEURAL DRAINAGE CATHETER;  Surgeon: [name], MD;  Location: [facility];  Service: Thoracic;  Laterality: Left;    .   CHEST TUBE INSERTION   Left   04/08/2016        Procedure: CHEST TUBE INSERTION;  Surgeon: [name], MD;  Location: [facility];  Service: Thoracic;  Laterality: Left;    .   COLONOSCOPY            .   CORONARY ANGIOPLASTY            .   CYSTOURETHROSCOPY                ROBOTIC ARM NUCLETRON SEED IMPLANTATION OF PROSTATE    .   EXPLORATORY LAPAROTOMY                For evaluation of lymphoma    .   REMOVAL OF PLEURAL DRAINAGE CATHETER  Left   04/08/2016        Procedure: REMOVAL OF PLEURAL DRAINAGE CATHETER;  Surgeon: [name], MD;  [facility];  Service: Thoracic;  Laterality: Left;          Home Medications:           Prior to Admission medications     Medication   Sig   Start Date   End Date   Taking?   Authorizing Provider    acetaminophen (TYLENOL) 500 MG tablet   Take 1,000 mg by mouth every 6 (six) hours as needed for mild pain, moderate pain or headache.            Yes   [provider]    clopidogrel (PLAVIX) 75 MG tablet   TAKE 1 TABLET BY MOUTH ONCE A DAY WITH BREAKFAST  Patient taking differently: Take 75 mg by mouth daily.    04/10/18       Yes   [name], MD    doxycycline (VIBRAMYCIN) 100 MG  capsule   Take 1 capsule (100 mg total) by mouth 2 (two) times daily.   04/14/18       Yes   Virgel Manifold, MD    furosemide (LASIX) 40 MG tablet   Take 40mg  daily by mouth. Except excess swelling or weight gain take an additional 40mg  daily  Patient taking differently: Take 40 mg by mouth daily. take an additional 40mg  daily for excess swelling or weight gain   02/27/18       Yes   Lendon Colonel, NP    gabapentin (NEURONTIN) 100 MG capsule   TAKE 1 CAPSULE BY MOUTH AT BEDTIME  Patient taking differently: Take 100 mg by mouth at bedtime.    03/20/18       Yes   [name], MD    Insulin Glargine (LANTUS SOLOSTAR) 100 UNIT/ML Solostar Pen   INJECT 25 UNITS EVERY NIGHT AT BEDTIME  Patient taking differently: Inject 25 Units into the skin every morning.    06/09/16       Yes   [name], MD    loperamide (IMODIUM) 2 MG capsule   Take 2 mg by mouth as needed for diarrhea or loose stools.           Yes   [provider]    meclizine (ANTIVERT) 12.5 MG tablet   Take 1 tablet (12.5 mg total) by mouth 3 (three) times daily as needed for dizziness.   06/27/15       Yes   [name], MD    metoprolol succinate (TOPROL-XL) 100 MG 24 hr tablet   TAKE 1 TABLET BY MOUTH ONCE DAILY  Patient taking differently: Take 100 mg by mouth daily.    12/28/17       Yes   [name], MD    Multiple Vitamin (MULTIVITAMIN WITH MINERALS) TABS tablet   Take 1 tablet by mouth every morning.            Yes   [provider]    NON FORMULARY   Inject 1 Dose into the vein once a week. Platelet booster           Yes   [provider]    OVER THE COUNTER MEDICATION   Place 1 drop into both eyes daily as needed (dry eyes). Over the counter lubricating eye drop  Yes   [provider]    tamsulosin (FLOMAX) 0.4 MG CAPS capsule   TAKE 1 CAPSULE  BY MOUTH ONCE DAILY  Patient taking differently: Take 0.4 mg by mouth daily.    04/11/18       Yes   [name], MD    traMADol (ULTRAM) 50 MG tablet   Take 1 tablet (50 mg total) by mouth every 6 (six) hours as needed for moderate pain.   01/03/18       Yes   [name], MD    doxycycline (VIBRA-TABS) 100 MG tablet   Take 1 tablet (100 mg total) by mouth 2 (two) times daily.   04/17/18           [name], MD          Inpatient Medications:  Scheduled Meds:   .   cyanocobalamin    1,000 mcg   Subcutaneous   Q30 days       Continuous Infusions:    PRN Meds:        Allergies:           Allergies    Allergen   Reactions    .   Glucophage [Metformin Hydrochloride]   Other (See Comments)            Chest pain    .   Zetia [Ezetimibe]   Other (See Comments)            weakness    .   Fenofibrate   Rash    .   Niacin-Lovastatin Er   Rash          Social History:     Social History             Socioeconomic History    .   Marital status:   Married            Spouse name:   Not on file    .   Number of children:   4    .   Years of education:   Not on file    .   Highest education level:   Not on file    Occupational History    .   Occupation:   retired    Scientific laboratory technician    .   Financial resource strain:   Not on file    .   Food insecurity:            Worry:   Not on file            Inability:   Not on file    .   Transportation needs:            Medical:   Not on file            Non-medical:   Not on file    Tobacco Use    .   Smoking status:   Former Smoker            Packs/day:   0.50            Years:   20.00            Pack years:   10.00            Types:   Pipe, Cigarettes            Last attempt to quit:    06/21/1958  Years since quitting:   59.8    .   Smokeless tobacco:   Never Used    Substance and Sexual Activity    .   Alcohol use:   No            Alcohol/week:   0.0 standard drinks    .   Drug use:   No    .   Sexual activity:   Yes            Partners:   Female    Lifestyle    .   Physical activity:            Days per week:   Not on file            Minutes per session:   Not on file    .   Stress:   Not on file    Relationships    .   Social connections:            Talks on phone:   Not on file            Gets together:   Not on file            Attends religious service:   Not on file            Active member of club or organization:   Not on file            Attends meetings of clubs or organizations:   Not on file            Relationship status:   Not on file    .   Intimate partner violence:            Fear of current or ex partner:   Not on file            Emotionally abused:   Not on file            Physically abused:   Not on file            Forced sexual activity:   Not on file    Other Topics   Concern    .   Not on file    Social History Narrative    .   Not on file       Family History:             Family History    Problem   Relation   Age of Onset    .   Lymphoma   Sister        .   Prostate cancer   Brother        .   Breast cancer   Sister        .   Diabetes   Brother        .   Diabetes   Sister        .   Heart disease   Brother        .   Asthma   Son              ROS:   Please see the history of present illness.   General:no colds no recent fevers since the 25th, no weight changes  Skin:no rashes or ulcers  HEENT:no blurred vision, no congestion CV:see HPI PUL:see HPI GI:no  diarrhea constipation or melena, no indigestion GU:no hematuria, no dysuria MS:no joint pain, no claudication Neuro:no  syncope, no lightheadedness  Endo:+ diabetes, no thyroid disease     All other ROS reviewed and negative.         Physical Exam/Data:           Vitals:        04/20/18 0846    BP:   (!) 137/58    Pulse:   82    Resp:   16    Temp:   98.8 F (37.1 C)    TempSrc:   Oral    SpO2:   97%       No intake or output data in the 24 hours ending 04/20/18 1231  There were no vitals filed for this visit.  There is no height or weight on file to calculate BMI.   General:  Well nourished and thin well developed, in no acute distress  HEENT: normal  Lymph: no adenopathy  Neck: no JVD  Endocrine:  No thryomegaly  Vascular: No carotid bruits; pedal pulses 2+ bilaterally    Cardiac:  normal S1, S2; RRR; 2/6 systolic murmur, no gallup rub or click  Lungs:  Breath sounds to auscultation bilaterally, + wheezing, no rhonchi or rales   Abd: soft, nontender, no hepatomegaly   Ext: tr to 1+ edema  Musculoskeletal:  No deformities, BUE and BLE strength normal and equal  Skin: warm and dry   Neuro:  Alert and oriented X 3 MAE follows commands, no focal abnormalities noted  Psych:  Normal affect         Relevant CV Studies:  TTE 02/20/18  Study Conclusions     - Left ventricle: The cavity size was mildly dilated. Systolic    function was moderately to severely reduced. The estimated    ejection fraction was in the range of 30% to 35%. There is    moderate hypokinesis of the mid-apicalapical myocardium. There is    moderate hypokinesis of the inferior myocardium. Doppler    parameters are consistent with abnormal left ventricular    relaxation (grade 1 diastolic dysfunction).  - Aortic valve: Left coronary cusp mobility was moderately    restricted. Transvalvular velocity was increased, due to    stenosis.  There was moderate to severe stenosis accounting for    poor systolic function. Valve area (VTI): 2 cm^2. Valve area    (Vmax): 2.12 cm^2. Valve area (Vmean): 1.96 cm^2.  - Mitral valve: There was moderate regurgitation.  - Left atrium: The atrium was moderately dilated.  - Right atrium: The atrium was moderately dilated.  - Tricuspid valve: There was moderate regurgitation.  - Pulmonary arteries: Systolic pressure was mildly increased.     TTE 02/03/18  Study Conclusions     - Left ventricle: The cavity size was mildly dilated. Wall    thickness was normal. Systolic function was mildly reduced. The    estimated ejection fraction was in the range of 45% to 50%.    Diffuse hypokinesis. Doppler parameters are consistent with    abnormal left ventricular relaxation (grade 1 diastolic    dysfunction).  - Aortic valve: Trileaflet; moderately thickened, severely    calcified leaflets. Valve mobility was restricted. There was mild    stenosis. Peak velocity (S): 281 cm/s. Mean gradient (S): 18 mm    Hg. Peak gradient (S): 32 mm Hg.  - Mitral valve: Calcified annulus. There was mild regurgitation.  - Left atrium: The atrium was mildly dilated.  - Right ventricle: The cavity size was mildly dilated. Wall  thickness was normal.  - Pericardium, extracardiac: A small pericardial effusion was    identified posterior to the heart.        Cardiac cath 02/15/2003    IMPRESSION/RECOMMENDATIONS:   1. Single vessel coronary artery disease involving the ostium of a small      ramus intermedius branch. As the patient is asymptomatic, we will      continue preventive efforts. Continue beta blocker.   2. Normal left ventricular size and systolic function.   3. Severe unilateral renal artery stenosis in the setting of poorly      controlled hypertension despite 4 medications.      Suggest percutaneous revascularization with goals of control of hypertension    and possible preservation of renal function.      The left renal artery is normal. The right renal artery has      a 90% stenosis occurring at the site of a very early bifurcation. The      lesion involves both branches     Laboratory Data:     [place]                                                                                             Hematology  Last Labs  Cardiac Enzymes Last Labs         Last Labs                                  BNP Last Labs         DDimer  Last Labs           Radiology/Studies:   Dg Chest 2 View     Result Date: 04/20/2018  CLINICAL DATA:  Chest pain for several hours EXAM: CHEST - 2 VIEW COMPARISON:  04/16/2018 FINDINGS: Cardiac shadow is stable. Aortic calcifications are again seen. Minimal blunting of the costophrenic angles is noted. No focal infiltrate is seen. No bony abnormality is noted. IMPRESSION: Minimal blunting of the costophrenic angles consistent with mild pleural fluid. No focal infiltrate is seen. Electronically Signed   By: Inez Catalina M.D.   On: 04/20/2018 10:03          Assessment and Plan:       1.Chest pain neg troponin poc, possibly related to AS vs angina. With anemia, no heparin.  Discussed with Dr. Oval Linsey who will see the pt.     2.CAD with hx of PTCA in 2004 to LAD with PTCA, troponin neg     3.AS increased on last echo,  Plan will be to admit pt and proceed with dobutamine echo stress to eval ischemia and if pt has severe AS.      4.Cardiomyopathy with new decrease in EF to 30-35%   5.Non hodgkin's lymphoma and chronic ITP and receives Romiplostim    6.Anemia occ injections for his anemia. Hgb 7.9 - this may be playing a role with AS.     7.Community acquired PNA on doxycyline.  Complete ABX             For questions or updates, please contact [place]  Please consult [email or web address] for contact info under untitled image           Signed,  Cecilie Kicks, NP   04/20/2018 12:31 PM              Revision History

## 2018-04-20 NOTE — ED Provider Notes (Signed)
Medical screening examination/treatment/procedure(s) were conducted as a shared visit with non-physician practitioner(s) and myself.  I personally evaluated the patient during the encounter.  EKG Interpretation  Date/Time:  Thursday April 20 2018 08:40:34 EDT Ventricular Rate:  82 PR Interval:  150 QRS Duration: 120 QT Interval:  428 QTC Calculation: 500 R Axis:   -28 Text Interpretation:  Sinus rhythm with occasional Premature ventricular complexes Septal infarct , age undetermined Abnormal ECG No significant change since last tracing Confirmed by Lacretia Leigh [phone]) on 04/20/2018 9:20:34 AM 84 year old male presents with chest pain that woke him up this morning which felt like a tightness.  Some associated shortness of breath.  X-ray shows possible pleural effusion.  EKG without acute ischemic changes.  Cardiology consulted and they recommend medical admission for evaluation of his chest pain as well as ongoing anemia.   Lacretia Leigh, MD 04/20/18 [phone]

## 2018-04-20 NOTE — ED Triage Notes (Signed)
Pt states he woke up this morning with chest pain. Family reports he described it as tightness. He states some associated shortness of breath. Pt does not appear to be in distress. Hx of ca.

## 2018-04-20 NOTE — ED Provider Notes (Signed)
[facility] EMERGENCY DEPARTMENT Provider Note   CSN: [id number] Arrival date & time: 04/20/18  5809     History   Chief Complaint Chief Complaint  Patient presents with  . Chest Pain    HPI Ricardo Watkins is a 84 y.o. male with a h/o of chronic ITP, CKD stage III, MDS with 5q deletion, CAD s/p PTCA and angioplasty, CHF, DM, HLD, HTN, Prostate CA who presents to the emergency department with a chief complaint of chest pain.  The patient endorses a sudden onset, substernal chest pain, characterized as tightness, that began when he awoke this morning with associated dyspnea.  Pain has been constant since onset.  Non-radiating.  Pain and dyspnea improved, but did not resolved, earlier this morning after walking around his house.  He also notes mild improvement after taking 2 tablets of Tylenol at 7 AM.  Pain and dyspnea are worse with laying flat and with deep expiration.   He reports he was having chills about a week ago and was started on doxycycline for CAP on 04/17/18. Chills have since resolved, and he denies fever.  He denies abdominal pain, nausea, vomiting, diarrhea, rash, dizziness, cough, URI symptoms.  He was also seen yesterday for an infusion of romiplostim for ITP.   Per chart review, other history includes Non-Hodgkin's lymphoma with Pleur-X catheter secondary to chronic non-malignant pleural effusions, IDDM hyperlipidemia, carotid artery disease, stable LICA stenosis, and hypertension, with mild AS.  The history is provided by the patient. No language interpreter was used.    Past Medical History:  Diagnosis Date  . Adenomatous colon polyp   . CAD (coronary artery disease)    30% LAD Stenosis, 70% ramus intermedius stenosis, treated with PTCA and angioplasty by Dr Albertine Patricia 2004  . Cataract    right eye  . CHF (congestive heart failure) ([place])   . Chronic ITP (idiopathic thrombocytopenia) (HCC)   . Cough, persistent 11/04/2015  . Deficiency anemia  04/19/2014  . Diverticulosis   . DM (diabetes mellitus) ([place])   . DVT (deep venous thrombosis) ([place])    secondary to surgery  . Dyslipidemia   . Dyspnea   . Hyperlipidemia   . Hypertension   . Inguinal hernia    right  . 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 Microcytic anemia 07/07/2015  . 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 Non Hodgkin's lymphoma ([place])   . Pleural effusion, left 11/04/2015  . Prostate CA ([place]) 09/10/2011   Gleason 3+3 R, 3+4 L lobe May 2007 Rx Radioactive seed implants Dr [name]  . PVD (peripheral vascular disease) ([place])    rt renal artery stent  . Renal insufficiency   . Thrombocytopenia ([place])   . Thrombotic stroke ([place]) 09/10/2011   January 18, 2011 infarct genu & post limb R internal capsule - acute; previous lacunar infarcts/extensive white matter dis  . Thyroid nodule    left lower lobe (annual monitoring).  . Vitamin D deficiency     Patient Active Problem List   Diagnosis Date Noted  . Chest pain 04/20/2018  . Chronic neutropenia ([place]) 03/02/2018  . CHF (congestive heart failure) ([place]) 02/19/2018  . Upper airway cough syndrome 10/27/2017  . Abnormal CT of the chest 07/27/2017  . Flu-like symptoms 06/02/2017  . Elevated lactic acid level 06/02/2017  . Acute kidney injury superimposed on chronic kidney disease ([place]) 06/02/2017  . CKD (chronic kidney disease), stage IV ([place]) 12/16/2016  .  Bilateral carotid artery stenosis 12/11/2016  . Dyslipidemia 12/11/2016  . Anemia in chronic kidney disease 07/01/2016  . Chronic ITP (idiopathic thrombocytopenia) (HCC) 07/01/2016  . B12 deficiency anemia   . Chest tube in place   . Acute diastolic CHF (congestive heart failure) ([place]) 04/16/2016  . Acute respiratory failure with hypoxia ([place]) 04/14/2016  . Empyema lung ([place])   . Type 2 diabetes mellitus ([place]) 04/04/2016  . Empyema ([place]) 04/03/2016  . Thyroid nodule 03/03/2016  . Sepsis ([place]) 02/17/2016  . DOE (dyspnea on  exertion) 01/27/2016  . Pulmonary edema with congestive heart failure ([place]) 11/12/2015  . Cough, persistent 11/04/2015  . Pleural effusion, left 11/04/2015  . Anorexia 08/08/2015  . Microcytic anemia 07/07/2015  . Pancytopenia ([place]) 04/08/2015  . Diarrhea 04/08/2015  . Encounter for chemotherapy management 04/02/2015  . Neutropenia ([place]) 02/18/2015  . MDS (myelodysplastic syndrome) with 5q deletion ([place]) 11/20/2014  . Deficiency anemia 04/19/2014  . Thrombocytopenia ([place]) 04/19/2014  . Vitamin B12 deficiency 04/19/2014  . Chronic renal failure, stage 3 (moderate) ([place]) 04/19/2014  . Macrocytic anemia 03/20/2013  . Monocytosis 03/20/2013  . Thrombotic stroke ([place]) 09/10/2011  . Prostate CA ([place]) 09/10/2011  . Inguinal hernia   . Colon polyps   . CAD (coronary artery disease) 08/26/2010  . HTN (hypertension) 08/26/2010  . Murmur 08/26/2010  . MURMUR 08/26/2010  . History of lymphoma 08/25/2010  . Diabetes ([place]) 08/25/2010  . DYSLIPIDEMIA 08/25/2010  . THROMBOCYTOPENIA 08/25/2010  . Essential hypertension 08/25/2010  . Coronary atherosclerosis 08/25/2010  . PVD 08/25/2010    Past Surgical History:  Procedure Laterality Date  . APPENDECTOMY     patient ?  Marland Kitchen CARDIAC CATHETERIZATION    . CHEST TUBE INSERTION Left 02/10/2016   Procedure: INSERTION PLEURAL DRAINAGE CATHETER;  Surgeon: [name], MD;  Location: [facility];  Service: Thoracic;  Laterality: Left;  . CHEST TUBE INSERTION Left 04/08/2016   Procedure: CHEST TUBE INSERTION;  Surgeon: [name], MD;  Location: [facility];  Service: Thoracic;  Laterality: Left;  . COLONOSCOPY    . CORONARY ANGIOPLASTY    . CYSTOURETHROSCOPY     ROBOTIC ARM NUCLETRON SEED IMPLANTATION OF PROSTATE  . EXPLORATORY LAPAROTOMY     For evaluation of lymphoma  . REMOVAL OF PLEURAL DRAINAGE CATHETER Left 04/08/2016   Procedure: REMOVAL OF PLEURAL DRAINAGE CATHETER;  Surgeon: [name], MD;  Location: [facility];  Service: Thoracic;   Laterality: Left;        Home Medications    Prior to Admission medications   Medication Sig Start Date End Date Taking? Authorizing Provider  acetaminophen (TYLENOL) 500 MG tablet Take 1,000 mg by mouth every 6 (six) hours as needed for mild pain, moderate pain or headache.    Yes [provider]  clopidogrel (PLAVIX) 75 MG tablet TAKE 1 TABLET BY MOUTH ONCE A DAY WITH BREAKFAST Patient taking differently: Take 75 mg by mouth daily.  04/10/18  Yes [name], MD  doxycycline (VIBRAMYCIN) 100 MG capsule Take 1 capsule (100 mg total) by mouth 2 (two) times daily. 04/14/18  Yes Virgel Manifold, MD  furosemide (LASIX) 40 MG tablet Take 40mg  daily by mouth. Except excess swelling or weight gain take an additional 40mg  daily Patient taking differently: Take 40 mg by mouth daily. take an additional 40mg  daily for excess swelling or weight gain 02/27/18  Yes Lendon Colonel, NP  gabapentin (NEURONTIN) 100 MG capsule TAKE 1 CAPSULE BY MOUTH AT BEDTIME Patient taking differently:  Take 100 mg by mouth at bedtime.  03/20/18  Yes [name], MD  Insulin Glargine (LANTUS SOLOSTAR) 100 UNIT/ML Solostar Pen INJECT 25 UNITS EVERY NIGHT AT BEDTIME Patient taking differently: Inject 25 Units into the skin every morning.  06/09/16  Yes [name], MD  loperamide (IMODIUM) 2 MG capsule Take 2 mg by mouth as needed for diarrhea or loose stools.   Yes [provider]  meclizine (ANTIVERT) 12.5 MG tablet Take 1 tablet (12.5 mg total) by mouth 3 (three) times daily as needed for dizziness. 06/27/15  Yes [name], MD  metoprolol succinate (TOPROL-XL) 100 MG 24 hr tablet TAKE 1 TABLET BY MOUTH ONCE DAILY Patient taking differently: Take 100 mg by mouth daily.  12/28/17  Yes [name], MD  Multiple Vitamin (MULTIVITAMIN WITH MINERALS) TABS tablet Take 1 tablet by mouth every morning.    Yes [provider]  NON FORMULARY Inject 1 Dose into the vein once a  week. Platelet booster   Yes [provider]  OVER THE COUNTER MEDICATION Place 1 drop into both eyes daily as needed (dry eyes). Over the counter lubricating eye drop   Yes [provider]  tamsulosin (FLOMAX) 0.4 MG CAPS capsule TAKE 1 CAPSULE BY MOUTH ONCE DAILY Patient taking differently: Take 0.4 mg by mouth daily.  04/11/18  Yes [name], MD  traMADol (ULTRAM) 50 MG tablet Take 1 tablet (50 mg total) by mouth every 6 (six) hours as needed for moderate pain. 01/03/18  Yes [name], MD  doxycycline (VIBRA-TABS) 100 MG tablet Take 1 tablet (100 mg total) by mouth 2 (two) times daily. 04/17/18   [name], MD    Family History Family History  Problem Relation Age of Onset  . Lymphoma Sister   . Prostate cancer Brother   . Breast cancer Sister   . Diabetes Brother   . Diabetes Sister   . Heart disease Brother   . Asthma Son     Social History Social History   Tobacco Use  . Smoking status: Former Smoker    Packs/day: 0.50    Years: 20.00    Pack years: 10.00    Types: Pipe, Cigarettes    Last attempt to quit: 06/21/1958    Years since quitting: 59.8  . Smokeless tobacco: Never Used  Substance Use Topics  . Alcohol use: No    Alcohol/week: 0.0 standard drinks  . Drug use: No     Allergies   Glucophage [metformin hydrochloride]; Zetia [ezetimibe]; Fenofibrate; and Niacin-lovastatin er   Review of Systems Review of Systems  Constitutional: Negative for appetite change, chills and fever.  Respiratory: Positive for shortness of breath. Negative for cough and wheezing.   Cardiovascular: Positive for chest pain. Negative for leg swelling.  Gastrointestinal: Negative for abdominal pain, diarrhea, nausea and vomiting.  Genitourinary: Negative for dysuria.  Musculoskeletal: Negative for back pain.  Skin: Negative for rash.  Allergic/Immunologic: Negative for immunocompromised state.  Neurological: Negative for weakness, numbness and  headaches.  Psychiatric/Behavioral: Negative for confusion.     Physical Exam Updated Vital Signs BP (!) 149/65 (BP Location: Left Arm)   Pulse (!) 105   Temp 99.1 F (37.3 C) (Oral)   Resp (!) 22   SpO2 92%   Physical Exam  Constitutional: He is oriented to person, place, and time. He appears well-developed. No distress.  HENT:  Head: Normocephalic.  Eyes: Conjunctivae are normal. No scleral icterus.  Neck: Neck  supple. No JVD present.  Cardiovascular: Normal rate and regular rhythm. Exam reveals no gallop and no friction rub.  Murmur heard. Pulses:      Radial pulses are 2+ on the right side, and 2+ on the left side.       Dorsalis pedis pulses are 2+ on the right side, and 2+ on the left side.       Posterior tibial pulses are 2+ on the right side, and 2+ on the left side.  Systolic Murmur present  Pulmonary/Chest: Effort normal and breath sounds normal. No stridor. No respiratory distress. He has no wheezes. He has no rales. He exhibits no tenderness.  Abdominal: Soft. He exhibits no distension.  Musculoskeletal:       Right lower leg: He exhibits no tenderness and no edema.       Left lower leg: He exhibits edema. He exhibits no tenderness.  Mild 1+ pitting edema to the left lower extremity.  Neurological: He is alert and oriented to person, place, and time.  Skin: Skin is warm and dry. He is not diaphoretic. There is pallor.  Psychiatric: His behavior is normal.  Nursing note and vitals reviewed.    ED Treatments / Results  Labs (all labs ordered are listed, but only abnormal results are displayed) Labs Reviewed  BASIC METABOLIC PANEL - Abnormal; Notable for the following components:      Result Value   CO2 21 (*)    Glucose, Bld 260 (*)    BUN 32 (*)    Creatinine, Ser 1.82 (*)    Calcium 8.6 (*)    GFR calc non Af Amer 32 (*)    GFR calc Af Amer 37 (*)    All other components within normal limits  CBC - Abnormal; Notable for the following components:    WBC 3.5 (*)    RBC 3.44 (*)    Hemoglobin 7.9 (*)    HCT 28.5 (*)    MCH 23.0 (*)    MCHC 27.7 (*)    RDW 27.7 (*)    Platelets 124 (*)    nRBC 0.6 (*)    All other components within normal limits  BRAIN NATRIURETIC PEPTIDE - Abnormal; Notable for the following components:   B Natriuretic Peptide 575.0 (*)    All other components within normal limits  GLUCOSE, CAPILLARY - Abnormal; Notable for the following components:   Glucose-Capillary 141 (*)    All other components within normal limits  TROPONIN I  TROPONIN I  TROPONIN I  COMPREHENSIVE METABOLIC PANEL  CBC  I-STAT TROPONIN, ED  TYPE AND SCREEN  PREPARE RBC (CROSSMATCH)    EKG EKG Interpretation  Date/Time:  Thursday April 20 2018 08:40:34 EDT Ventricular Rate:  82 PR Interval:  150 QRS Duration: 120 QT Interval:  428 QTC Calculation: 500 R Axis:   -28 Text Interpretation:  Sinus rhythm with occasional Premature ventricular complexes Septal infarct , age undetermined Abnormal ECG No significant change since last tracing Confirmed by Lacretia Leigh ([id number]) on 04/20/2018 9:50:57 AM   Radiology Dg Chest 2 View  Result Date: 04/20/2018 CLINICAL DATA:  Chest pain for several hours EXAM: CHEST - 2 VIEW COMPARISON:  04/16/2018 FINDINGS: Cardiac shadow is stable. Aortic calcifications are again seen. Minimal blunting of the costophrenic angles is noted. No focal infiltrate is seen. No bony abnormality is noted. IMPRESSION: Minimal blunting of the costophrenic angles consistent with mild pleural fluid. No focal infiltrate is seen. Electronically Signed   By: Elta Guadeloupe  Lukens M.D.   On: 04/20/2018 10:03    Procedures Procedures (including critical care time)  Medications Ordered in ED Medications  acetaminophen (TYLENOL) tablet 650 mg (has no administration in time range)  traMADol (ULTRAM) tablet 50 mg (has no administration in time range)  doxycycline (VIBRA-TABS) tablet 100 mg (has no administration in time range)    metoprolol succinate (TOPROL-XL) 24 hr tablet 100 mg (has no administration in time range)  insulin glargine (LANTUS) injection 20 Units (has no administration in time range)  meclizine (ANTIVERT) tablet 12.5 mg (has no administration in time range)  cyanocobalamin ((VITAMIN B-12)) injection 1,000 mcg (has no administration in time range)  gabapentin (NEURONTIN) capsule 100 mg (has no administration in time range)  clopidogrel (PLAVIX) tablet 75 mg (has no administration in time range)  insulin aspart (novoLOG) injection 0-9 Units (has no administration in time range)  0.9 %  sodium chloride infusion (Manually program via Guardrails IV Fluids) (has no administration in time range)  tamsulosin (FLOMAX) capsule 0.4 mg (has no administration in time range)  multivitamin with minerals tablet 1 tablet (has no administration in time range)  furosemide (LASIX) injection 40 mg (has no administration in time range)  traMADol (ULTRAM) tablet 50 mg (50 mg Oral Given 04/20/18 1013)  acetaminophen (TYLENOL) tablet 1,000 mg (1,000 mg Oral Given 04/20/18 1422)  albuterol (PROVENTIL) (2.5 MG/3ML) 0.083% nebulizer solution 5 mg (5 mg Nebulization Given 04/20/18 1422)  ondansetron (ZOFRAN) injection 4 mg (4 mg Intravenous Given 04/20/18 1547)     Initial Impression / Assessment and Plan / ED Course  I have reviewed the triage vital signs and the nursing notes.  Pertinent labs & imaging results that were available during my care of the patient were reviewed by me and considered in my medical decision making (see chart for details).     84 year old male with a h/o of chronic ITP, CKD stage III, MDS with 5q deletion, CAD s/p PTCA and angioplasty, CHF, DM, HLD, HTN, Prostate CA presenting with chest pain and intermittent shortness of breath, onset this morning.  EKG unchanged from previous.  CXR with some blunting of the costophrenic angles.  Hemoglobin 7.9, up from 7.8 yesterday.  Platelets are 124, down  from 131 yesterday.  Glucose is elevated at 260, but anion gap is normal.  Labs are otherwise at the patient's baseline.  BNP is pending.  The patient was seen and independently evaluated by Dr. Zenia Resides, attending physician.  Per medical chart, the patient was seen by cardiology in September and had a TTE due to concern for endocarditis.  No vegetation was seen at that time, but was told if symptoms persisted he may require TEE.    Consult to cardiology who advised the patient be admitted for dobutamine stress test with medical admission.   [place], the patient had some audible wheezing and was given a DuoNeb.  Cardiology advised the patient was cleared to eat and drink.  After the patient started eating a [place] sandwich, he had one episode of vomiting.  Vomiting resolved after Zofran.   Consulted the hospitalist team and Dr. Renne Crigler will admit. The patient appears reasonably stabilized for admission considering the current resources, flow, and capabilities available in the ED at this time, and I doubt any other [facility] requiring further screening and/or treatment in the ED prior to admission.  Final Clinical Impressions(s) / ED Diagnoses   Final diagnoses:  Chest pain, unspecified type    ED Discharge Orders    None  Joanne Gavel, PA-C 04/20/18 1738    Lacretia Leigh, MD 04/21/18 [phone]

## 2018-04-20 NOTE — H&P (Deleted)
Cardiology Consultation:   Patient ID: Ricardo Watkins MRN: [id number]; DOB: 1931/05/01  Admit date: 04/20/2018 Date of Consult: 04/20/2018  Primary Care Provider: [name], MD Primary Cardiologist: Ricardo Breeding, MD  Primary Electrophysiologist:  None   Chief complaint: Streptococcus viridans as well as coagulase-negative staph Chest pain.  Patient Profile:   Ricardo Watkins is a 84 y.o. male with a hx of CAD, PTCA and PTCA of ramus intermedius 2004, non Hodgkin's lymphoma, with pleur-X cath secondary to non malignant pl effusions, IDDM, hld, cartid artery disease, stable LICA stenosis, HTN who is being seen today for the evaluation of chest pain at the request of Ricardo Watkins.Marland Kitchen  History of Present Illness:   Ricardo Watkins with above hx and hospitalization 02/21/18 for CHF and diuresed. Discharged on lasix 40 mg.  Last echo EF 30-35% mildly dilated LV, G1 DD.  Moderate to severe stenosis of AV,  Moderate TR, mod MR, LA was moderately dilated.  This was a drop in EF from 01/2018 to 02/2018.  The drop was new.  Troponin was 0.04 X 2 at that time.  Stable on last OV 03/15/18.  Last saw oncology for OV and dx of MDS (myelodysplastic syndrome) maybe chemo related has monthly B 12 injections. Chronic neutropenia due to underlying bone marrow disease.    Has been seen in ER 10/25 for dizziness and chills temp 102 blood cultures were drawn, and on the 27th  returned for repeat eval after + blood culture Streptococcus viridans as well as coagulase-negative staph. It was thought was contaminant.  Treated with Doxycycline for presumed community acquired pneumonia.  Today presents to ER with chest tightness.  Some SOB.  Pt woke with tightness, mid sternal with no radiation, no nausea or diaphoresis but mild SOB after he was up and headed to hospital it did improve some, here it returned and then walking to BR it improved.   No dizziness, occ palpitations.    EKG I personally reviewed. SR with rare PVC and  poor R wave progression in ant leads.  No acute change.   Na 135, K+ 4.1, C02 21, glucose 260, BUN 32, Cr 1.82  Ca+ 8.6 Troponin 0.02 Hgb 7.9 HCT 28.5, WBC 3.5 plts 124.  CXR:  Minimal blunting of the costophrenic angles consistent with mild pleural fluid. No focal infiltrate is seen.   Resting comfortably now.  Past Medical History:  Diagnosis Date  . Adenomatous colon polyp   . CAD (coronary artery disease)    30% LAD Stenosis, 70% ramus intermedius stenosis, treated with PTCA and angioplasty by Dr Ricardo Watkins 2004  . Cataract    right eye  . CHF (congestive heart failure) ([place])   . Cough, persistent 11/04/2015  . Deficiency anemia 04/19/2014  . Diverticulosis   . DM (diabetes mellitus) ([place])   . DVT (deep venous thrombosis) ([place])    secondary to surgery  . Dyslipidemia   . Dyspnea   . Hyperlipidemia   . Hypertension   . Inguinal hernia    right  . 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 Microcytic anemia 07/07/2015  . 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 Non Hodgkin's lymphoma ([place])   . Pleural effusion, left 11/04/2015  . Prostate CA ([place]) 09/10/2011   Gleason 3+3 R, 3+4 L lobe May 2007 Rx Radioactive seed implants Dr [name]  . PVD (peripheral vascular disease) ([place])    rt renal artery stent  . Renal insufficiency   .  Thrombocytopenia ([place])   . Thrombotic stroke ([place]) 09/10/2011   January 18, 2011 infarct genu & post limb R internal capsule - acute; previous lacunar infarcts/extensive white matter dis  . Thyroid nodule    left lower lobe (annual monitoring).  . Vitamin D deficiency     Past Surgical History:  Procedure Laterality Date  . APPENDECTOMY     patient ?  Marland Kitchen CARDIAC CATHETERIZATION    . CHEST TUBE INSERTION Left 02/10/2016   Procedure: INSERTION PLEURAL DRAINAGE CATHETER;  Surgeon: [name], MD;  Location: [place];  Service: Thoracic;  Laterality: Left;  . CHEST TUBE INSERTION Left 04/08/2016   Procedure: CHEST TUBE  INSERTION;  Surgeon: [name], MD;  Location: [place];  Service: Thoracic;  Laterality: Left;  . COLONOSCOPY    . CORONARY ANGIOPLASTY    . CYSTOURETHROSCOPY     ROBOTIC ARM NUCLETRON SEED IMPLANTATION OF PROSTATE  . EXPLORATORY LAPAROTOMY     For evaluation of lymphoma  . REMOVAL OF PLEURAL DRAINAGE CATHETER Left 04/08/2016   Procedure: REMOVAL OF PLEURAL DRAINAGE CATHETER;  Surgeon: [name], MD;  Location: [place];  Service: Thoracic;  Laterality: Left;     Home Medications:  Prior to Admission medications   Medication Sig Start Date End Date Taking? Authorizing Provider  acetaminophen (TYLENOL) 500 MG tablet Take 1,000 mg by mouth every 6 (six) hours as needed for mild pain, moderate pain or headache.    Yes [provider]  clopidogrel (PLAVIX) 75 MG tablet TAKE 1 TABLET BY MOUTH ONCE A DAY WITH BREAKFAST Patient taking differently: Take 75 mg by mouth daily.  04/10/18  Yes Ricardo Frizzle, MD  doxycycline (VIBRAMYCIN) 100 MG capsule Take 1 capsule (100 mg total) by mouth 2 (two) times daily. 04/14/18  Yes Virgel Manifold, MD  furosemide (LASIX) 40 MG tablet Take 40mg  daily by mouth. Except excess swelling or weight gain take an additional 40mg  daily Patient taking differently: Take 40 mg by mouth daily. take an additional 40mg  daily for excess swelling or weight gain 02/27/18  Yes Lendon Colonel, NP  gabapentin (NEURONTIN) 100 MG capsule TAKE 1 CAPSULE BY MOUTH AT BEDTIME Patient taking differently: Take 100 mg by mouth at bedtime.  03/20/18  Yes Ricardo Frizzle, MD  Insulin Glargine (LANTUS SOLOSTAR) 100 UNIT/ML Solostar Pen INJECT 25 UNITS EVERY NIGHT AT BEDTIME Patient taking differently: Inject 25 Units into the skin every morning.  06/09/16  Yes Ricardo Frizzle, MD  loperamide (IMODIUM) 2 MG capsule Take 2 mg by mouth as needed for diarrhea or loose stools.   Yes [provider]  meclizine (ANTIVERT) 12.5 MG tablet Take 1 tablet (12.5 mg total)  by mouth 3 (three) times daily as needed for dizziness. 06/27/15  Yes Ricardo Frizzle, MD  metoprolol succinate (TOPROL-XL) 100 MG 24 hr tablet TAKE 1 TABLET BY MOUTH ONCE DAILY Patient taking differently: Take 100 mg by mouth daily.  12/28/17  Yes Ricardo Frizzle, MD  Multiple Vitamin (MULTIVITAMIN WITH MINERALS) TABS tablet Take 1 tablet by mouth every morning.    Yes [provider]  NON FORMULARY Inject 1 Dose into the vein once a week. Platelet booster   Yes [provider]  OVER THE COUNTER MEDICATION Place 1 drop into both eyes daily as needed (dry eyes). Over the counter lubricating eye drop   Yes [provider]  tamsulosin (FLOMAX) 0.4 MG CAPS capsule TAKE 1 CAPSULE BY MOUTH ONCE DAILY  Patient taking differently: Take 0.4 mg by mouth daily.  04/11/18  Yes Ricardo Frizzle, MD  traMADol (ULTRAM) 50 MG tablet Take 1 tablet (50 mg total) by mouth every 6 (six) hours as needed for moderate pain. 01/03/18  Yes Ricardo Frizzle, MD  doxycycline (VIBRA-TABS) 100 MG tablet Take 1 tablet (100 mg total) by mouth 2 (two) times daily. 04/17/18   Ricardo Frizzle, MD    Inpatient Medications: Scheduled Meds: . cyanocobalamin  1,000 mcg Subcutaneous Q30 days   Continuous Infusions:  PRN Meds:   Allergies:    Allergies  Allergen Reactions  . Glucophage [Metformin Hydrochloride] Other (See Comments)    Chest pain  . Zetia [Ezetimibe] Other (See Comments)    weakness  . Fenofibrate Rash  . Niacin-Lovastatin Er Rash    Social History:   Social History   Socioeconomic History  . Marital status: Married    Spouse name: Not on file  . Number of children: 4  . Years of education: Not on file  . Highest education level: Not on file  Occupational History  . Occupation: retired  Scientific laboratory technician  . Financial resource strain: Not on file  . Food insecurity:    Worry: Not on file    Inability: Not on file  . Transportation needs:    Medical: Not on file     Non-medical: Not on file  Tobacco Use  . Smoking status: Former Smoker    Packs/day: 0.50    Years: 20.00    Pack years: 10.00    Types: Pipe, Cigarettes    Last attempt to quit: 06/21/1958    Years since quitting: 59.8  . Smokeless tobacco: Never Used  Substance and Sexual Activity  . Alcohol use: No    Alcohol/week: 0.0 standard drinks  . Drug use: No  . Sexual activity: Yes    Partners: Female  Lifestyle  . Physical activity:    Days per week: Not on file    Minutes per session: Not on file  . Stress: Not on file  Relationships  . Social connections:    Talks on phone: Not on file    Gets together: Not on file    Attends religious service: Not on file    Active member of club or organization: Not on file    Attends meetings of clubs or organizations: Not on file    Relationship status: Not on file  . Intimate partner violence:    Fear of current or ex partner: Not on file    Emotionally abused: Not on file    Physically abused: Not on file    Forced sexual activity: Not on file  Other Topics Concern  . Not on file  Social History Narrative  . Not on file    Family History:    Family History  Problem Relation Age of Onset  . Lymphoma Sister   . Prostate cancer Brother   . Breast cancer Sister   . Diabetes Brother   . Diabetes Sister   . Heart disease Brother   . Asthma Son      ROS:  Please see the history of present illness.  General:no colds no recent fevers since the 25th, no weight changes Skin:no rashes or ulcers HEENT:no blurred vision, no congestion CV:see HPI PUL:see HPI GI:no diarrhea constipation or melena, no indigestion GU:no hematuria, no dysuria MS:no joint pain, no claudication Neuro:no syncope, no lightheadedness Endo:+ diabetes, no thyroid disease  All other ROS reviewed  and negative.     Physical Exam/Data:   Vitals:   04/20/18 0846  BP: (!) 137/58  Pulse: 82  Resp: 16  Temp: 98.8 F (37.1 C)  TempSrc: Oral  SpO2: 97%    No intake or output data in the 24 hours ending 04/20/18 1231 There were no vitals filed for this visit. There is no height or weight on file to calculate BMI.  General:  Well nourished and thin well developed, in no acute distress HEENT: normal Lymph: no adenopathy Neck: no JVD Endocrine:  No thryomegaly Vascular: No carotid bruits; pedal pulses 2+ bilaterally   Cardiac:  normal S1, S2; RRR; 2/6 systolic murmur, no gallup rub or click Lungs:  Breath sounds to auscultation bilaterally, + wheezing, no rhonchi or rales  Abd: soft, nontender, no hepatomegaly  Ext: tr to 1+ edema Musculoskeletal:  No deformities, BUE and BLE strength normal and equal Skin: warm and dry  Neuro:  Alert and oriented X 3 MAE follows commands, no focal abnormalities noted Psych:  Normal affect    Relevant CV Studies: TTE 02/20/18 Study Conclusions  - Left ventricle: The cavity size was mildly dilated. Systolic   function was moderately to severely reduced. The estimated   ejection fraction was in the range of 30% to 35%. There is   moderate hypokinesis of the mid-apicalapical myocardium. There is   moderate hypokinesis of the inferior myocardium. Doppler   parameters are consistent with abnormal left ventricular   relaxation (grade 1 diastolic dysfunction). - Aortic valve: Left coronary cusp mobility was moderately   restricted. Transvalvular velocity was increased, due to   stenosis. There was moderate to severe stenosis accounting for   poor systolic function. Valve area (VTI): 2 cm^2. Valve area   (Vmax): 2.12 cm^2. Valve area (Vmean): 1.96 cm^2. - Mitral valve: There was moderate regurgitation. - Left atrium: The atrium was moderately dilated. - Right atrium: The atrium was moderately dilated. - Tricuspid valve: There was moderate regurgitation. - Pulmonary arteries: Systolic pressure was mildly increased.  TTE 02/03/18 Study Conclusions  - Left ventricle: The cavity size was mildly  dilated. Wall   thickness was normal. Systolic function was mildly reduced. The   estimated ejection fraction was in the range of 45% to 50%.   Diffuse hypokinesis. Doppler parameters are consistent with   abnormal left ventricular relaxation (grade 1 diastolic   dysfunction). - Aortic valve: Trileaflet; moderately thickened, severely   calcified leaflets. Valve mobility was restricted. There was mild   stenosis. Peak velocity (S): 281 cm/s. Mean gradient (S): 18 mm   Hg. Peak gradient (S): 32 mm Hg. - Mitral valve: Calcified annulus. There was mild regurgitation. - Left atrium: The atrium was mildly dilated. - Right ventricle: The cavity size was mildly dilated. Wall   thickness was normal. - Pericardium, extracardiac: A small pericardial effusion was   identified posterior to the heart.   Cardiac cath 02/15/2003   IMPRESSION/RECOMMENDATIONS:  1. Single vessel coronary artery disease involving the ostium of a small     ramus intermedius branch. As the patient is asymptomatic, we will     continue preventive efforts. Continue beta blocker.  2. Normal left ventricular size and systolic function.  3. Severe unilateral renal artery stenosis in the setting of poorly     controlled hypertension despite 4 medications.   Suggest percutaneous revascularization with goals of control of hypertension  and possible preservation of renal function.   The left renal artery is normal. The right  renal artery has     a 90% stenosis occurring at the site of a very early bifurcation. The     lesion involves both branches  Laboratory Data:  Chemistry Recent Labs  Lab 04/14/18 1408 04/16/18 1130 04/20/18 0852  NA 138 135 135  K 4.4 5.1 4.1  CL 108 108 102  CO2 24 20* 21*  GLUCOSE 151* 193* 260*  BUN 31* 32* 32*  CREATININE 2.02* 2.17* 1.82*  CALCIUM 8.5* 8.4* 8.6*  GFRNONAA 28* 26* 32*  GFRAA 33* 30* 37*  ANIONGAP 6 7 12     Recent Labs  Lab 04/14/18 1621 04/16/18 1130  PROT 7.1  6.8  ALBUMIN 3.4* 3.1*  AST 17 30  ALT 17 14  ALKPHOS 51 48  BILITOT 0.6 1.0   Hematology Recent Labs  Lab 04/16/18 1130 04/19/18 1442 04/20/18 0852  WBC 2.9* 3.0* 3.5*  RBC 3.56* 3.23* 3.44*  HGB 8.3* 7.8* 7.9*  HCT 29.8* 26.8* 28.5*  MCV 83.7 83.0 82.8  MCH 23.3* 24.1* 23.0*  MCHC 27.9* 29.1* 27.7*  RDW 27.5* 26.8* 27.7*  PLT 102* 131* 124*   Cardiac EnzymesNo results for input(s): TROPONINI in the last 168 hours.  Recent Labs  Lab 04/20/18 0858  TROPIPOC 0.02    BNPNo results for input(s): BNP, PROBNP in the last 168 hours.  DDimer No results for input(s): DDIMER in the last 168 hours.  Radiology/Studies:  Dg Chest 2 View  Result Date: 04/20/2018 CLINICAL DATA:  Chest pain for several hours EXAM: CHEST - 2 VIEW COMPARISON:  04/16/2018 FINDINGS: Cardiac shadow is stable. Aortic calcifications are again seen. Minimal blunting of the costophrenic angles is noted. No focal infiltrate is seen. No bony abnormality is noted. IMPRESSION: Minimal blunting of the costophrenic angles consistent with mild pleural fluid. No focal infiltrate is seen. Electronically Signed   By: Inez Catalina M.D.   On: 04/20/2018 10:03    Assessment and Plan:   1. Chest pain neg troponin poc, possibly related to AS vs angina. With anemia, no heparin.  Discussed with Dr. Oval Linsey who will see the pt.  2. CAD with hx of PTCA in 2004 to LAD with PTCA, troponin neg   3. AS increased on last echo,  Plan will be to admit pt and proceed with dobutamine echo stress to eval ischemia and if pt has severe AS.    4. Cardiomyopathy with new decrease in EF to 30-35% 5. Non hodgkin's lymphoma and chronic ITP and receives Romiplostim  6. Anemia occ injections for his anemia. Hgb 7.9 - this may be playing a role with AS.   7. Community acquired PNA on doxycyline.  Complete ABX      For questions or updates, please contact [place] Please consult [email or web address] for contact info under     Signed, Cecilie Kicks, NP  04/20/2018 12:31 PM

## 2018-04-21 DIAGNOSIS — D631 Anemia in chronic kidney disease: Secondary | ICD-10-CM | POA: Diagnosis not present

## 2018-04-21 DIAGNOSIS — E1122 Type 2 diabetes mellitus with diabetic chronic kidney disease: Secondary | ICD-10-CM | POA: Diagnosis not present

## 2018-04-21 DIAGNOSIS — R079 Chest pain, unspecified: Secondary | ICD-10-CM | POA: Diagnosis not present

## 2018-04-21 DIAGNOSIS — I10 Essential (primary) hypertension: Secondary | ICD-10-CM

## 2018-04-21 DIAGNOSIS — D693 Immune thrombocytopenic purpura: Secondary | ICD-10-CM | POA: Diagnosis not present

## 2018-04-21 DIAGNOSIS — N184 Chronic kidney disease, stage 4 (severe): Secondary | ICD-10-CM | POA: Diagnosis not present

## 2018-04-21 DIAGNOSIS — I5043 Acute on chronic combined systolic (congestive) and diastolic (congestive) heart failure: Secondary | ICD-10-CM | POA: Diagnosis not present

## 2018-04-21 DIAGNOSIS — N183 Chronic kidney disease, stage 3 (moderate): Secondary | ICD-10-CM

## 2018-04-21 LAB — TYPE AND SCREEN
ABO/RH(D): O POS
Antibody Screen: NEGATIVE
UNIT DIVISION: 0

## 2018-04-21 LAB — CBC
HEMATOCRIT: 31 % — AB (ref 39.0–52.0)
Hemoglobin: 8.8 g/dL — ABNORMAL LOW (ref 13.0–17.0)
MCH: 23.2 pg — ABNORMAL LOW (ref 26.0–34.0)
MCHC: 28.4 g/dL — AB (ref 30.0–36.0)
MCV: 81.6 fL (ref 80.0–100.0)
PLATELETS: DECREASED 10*3/uL (ref 150–400)
RBC: 3.8 MIL/uL — ABNORMAL LOW (ref 4.22–5.81)
RDW: 26 % — AB (ref 11.5–15.5)
WBC: 3.3 10*3/uL — ABNORMAL LOW (ref 4.0–10.5)
nRBC: 0 % (ref 0.0–0.2)

## 2018-04-21 LAB — COMPREHENSIVE METABOLIC PANEL
ALBUMIN: 3.1 g/dL — AB (ref 3.5–5.0)
ALK PHOS: 50 U/L (ref 38–126)
ALT: 13 U/L (ref 0–44)
AST: 14 U/L — AB (ref 15–41)
Anion gap: 6 (ref 5–15)
BUN: 32 mg/dL — AB (ref 8–23)
CALCIUM: 8.5 mg/dL — AB (ref 8.9–10.3)
CO2: 25 mmol/L (ref 22–32)
CREATININE: 1.83 mg/dL — AB (ref 0.61–1.24)
Chloride: 105 mmol/L (ref 98–111)
GFR calc non Af Amer: 32 mL/min — ABNORMAL LOW (ref >60)
GFR, EST AFRICAN AMERICAN: 37 mL/min — AB (ref >60)
GLUCOSE: 102 mg/dL — AB (ref 70–99)
Potassium: 3.9 mmol/L (ref 3.5–5.1)
SODIUM: 136 mmol/L (ref 135–145)
Total Bilirubin: 0.7 mg/dL (ref 0.3–1.2)
Total Protein: 7.4 g/dL (ref 6.5–8.1)

## 2018-04-21 LAB — GLUCOSE, CAPILLARY
GLUCOSE-CAPILLARY: 94 mg/dL (ref 70–99)
Glucose-Capillary: 166 mg/dL — ABNORMAL HIGH (ref 70–99)
Glucose-Capillary: 232 mg/dL — ABNORMAL HIGH (ref 70–99)
Glucose-Capillary: 154 mg/dL — ABNORMAL HIGH (ref 70–99)

## 2018-04-21 LAB — CULTURE, BLOOD (ROUTINE X 2)
Culture: NO GROWTH
Culture: NO GROWTH
Special Requests: ADEQUATE
Special Requests: ADEQUATE

## 2018-04-21 LAB — BPAM RBC
BLOOD PRODUCT EXPIRATION DATE: 201911292359
ISSUE DATE / TIME: 201910311827
UNIT TYPE AND RH: 5100

## 2018-04-21 LAB — TROPONIN I: Troponin I: 0.04 ng/mL (ref <0.03)

## 2018-04-21 MED ORDER — FUROSEMIDE 10 MG/ML IJ SOLN: 80.0000 mg | Freq: Two times a day (BID) | INTRAMUSCULAR | Status: DC

## 2018-04-21 MED ORDER — FUROSEMIDE 10 MG/ML IJ SOLN: 40.0000 mg | Freq: Two times a day (BID) | INTRAMUSCULAR | Status: DC

## 2018-04-21 MED ORDER — FUROSEMIDE 10 MG/ML IJ SOLN
80.0000 mg | Freq: Two times a day (BID) | INTRAMUSCULAR | Status: DC
  Administered 2018-04-21 – 2018-04-22 (×2): 80 mg via INTRAVENOUS
  Filled 2018-04-21 (×2): qty 8

## 2018-04-21 NOTE — Progress Notes (Signed)
Progress Note  Patient Name: Ricardo Watkins Date of Encounter: 04/21/2018  Primary Cardiologist: Ricardo Breeding, MD   Subjective   Feeling well.  Denies any chest pain.  He had a headache this morning that improved with Tylenol.  Breathing is stable.  Inpatient Medications    Scheduled Meds: . clopidogrel  75 mg Oral Daily  . [START ON 04/27/2018] cyanocobalamin  1,000 mcg Subcutaneous Q30 days  . doxycycline  100 mg Oral BID  . furosemide  40 mg Intravenous Daily  . gabapentin  100 mg Oral QHS  . insulin aspart  0-9 Units Subcutaneous TID WC  . insulin glargine  20 Units Subcutaneous [id number]  . metoprolol succinate  100 mg Oral Daily  . multivitamin with minerals  1 tablet Oral Q1400  . tamsulosin  0.4 mg Oral Daily   Continuous Infusions:  PRN Meds: acetaminophen, meclizine, traMADol   Vital Signs    Vitals:   04/20/18 1845 04/20/18 1900 04/20/18 2130 04/21/18 0359  BP: (!) 121/55 (!) 128/55 (!) 126/59 (!) 146/61  Pulse: 87 84 76 86  Resp: 16 18 20 18   Temp: 98.5 F (36.9 C) 98.1 F (36.7 C) 98 F (36.7 C) (!) 97.5 F (36.4 C)  TempSrc: Oral Oral Oral Oral  SpO2: 93% 93% 93% 97%    Intake/Output Summary (Last 24 hours) at 04/21/2018 0816 Last data filed at 04/21/2018 0700 Gross per 24 hour  Intake 659 ml  Output 925 ml  Net -266 ml   There were no vitals filed for this visit.  Telemetry    Sinus rhythm.  PVCs.  Ventricular bigeminy.- Personally Reviewed  ECG    04/20/2018: Sinus rhythm.  Rate 81 bpm.  LAFB.  PVC- Personally Reviewed  Physical Exam   VS:  BP (!) 146/61 (BP Location: Left Arm)   Pulse 86   Temp (!) 97.5 F (36.4 C) (Oral)   Resp 18   SpO2 97%  , BMI There is no height or weight on file to calculate BMI. GENERAL: Chronically ill-appearing HEENT: Pupils equal round and reactive, fundi not visualized, oral mucosa unremarkable NECK:  + jugular venous distention, waveform within normal limits, carotid upstroke brisk and symmetric, no  bruits, no thyromegaly LUNGS: Bibasilar crackles HEART:  RRR.  PMI not displaced or sustained,S1 and S2 within normal limits, no S3, no S4, no clicks, no rubs, III/VI mid-peaking systolic murmur at the left upper sternal border ABD:  Flat, positive bowel sounds normal in frequency in pitch, no bruits, no rebound, no guarding, no midline pulsatile mass, no hepatomegaly, no splenomegaly EXT:  2 plus pulses throughout, no edema, no cyanosis no clubbing SKIN:  No rashes no nodules NEURO:  Cranial nerves II through XII grossly intact, motor grossly intact throughout [facility]:  Cognitively intact, oriented to person place and time   Labs    Chemistry Recent Labs  Lab 04/14/18 1621 04/16/18 1130 04/20/18 0852 04/21/18 0701  NA  --  135 135 136  K  --  5.1 4.1 3.9  CL  --  108 102 105  CO2  --  20* 21* 25  GLUCOSE  --  193* 260* 102*  BUN  --  32* 32* 32*  CREATININE  --  2.17* 1.82* 1.83*  CALCIUM  --  8.4* 8.6* 8.5*  PROT 7.1 6.8  --  7.4  ALBUMIN 3.4* 3.1*  --  3.1*  AST 17 30  --  14*  ALT 17 14  --  13  ALKPHOS 51 48  --  50  BILITOT 0.6 1.0  --  0.7  GFRNONAA  --  26* 32* 32*  GFRAA  --  30* 37* 37*  ANIONGAP  --  7 12 6      Hematology Recent Labs  Lab 04/19/18 1442 04/20/18 0852 04/21/18 0701  WBC 3.0* 3.5* PENDING  RBC 3.23* 3.44* 3.80*  HGB 7.8* 7.9* 8.8*  HCT 26.8* 28.5* 31.0*  MCV 83.0 82.8 81.6  MCH 24.1* 23.0* 23.2*  MCHC 29.1* 27.7* 28.4*  RDW 26.8* 27.7* 26.0*  PLT 131* 124* PENDING    Cardiac Enzymes Recent Labs  Lab 04/20/18 1658 04/21/18 0701  TROPONINI 0.03* 0.04*    Recent Labs  Lab 04/20/18 0858  TROPIPOC 0.02     BNP Recent Labs  Lab 04/20/18 0852  BNP 575.0*     DDimer No results for input(s): DDIMER in the last 168 hours.   Radiology    Dg Chest 2 View  Result Date: 04/20/2018 CLINICAL DATA:  Chest pain for several hours EXAM: CHEST - 2 VIEW COMPARISON:  04/16/2018 FINDINGS: Cardiac shadow is stable. Aortic calcifications  are again seen. Minimal blunting of the costophrenic angles is noted. No focal infiltrate is seen. No bony abnormality is noted. IMPRESSION: Minimal blunting of the costophrenic angles consistent with mild pleural fluid. No focal infiltrate is seen. Electronically Signed   By: Inez Catalina M.D.   On: 04/20/2018 10:03    Cardiac Studies   Echo 02/20/2018: Study Conclusions  - Left ventricle: The cavity size was mildly dilated. Systolic   function was moderately to severely reduced. The estimated   ejection fraction was in the range of 30% to 35%. There is   moderate hypokinesis of the mid-apicalapical myocardium. There is   moderate hypokinesis of the inferior myocardium. Doppler   parameters are consistent with abnormal left ventricular   relaxation (grade 1 diastolic dysfunction). - Aortic valve: Left coronary cusp mobility was moderately   restricted. Transvalvular velocity was increased, due to   stenosis. There was moderate to severe stenosis accounting for   poor systolic function. Valve area (VTI): 2 cm^2. Valve area   (Vmax): 2.12 cm^2. Valve area (Vmean): 1.96 cm^2. - Mitral valve: There was moderate regurgitation. - Left atrium: The atrium was moderately dilated. - Right atrium: The atrium was moderately dilated. - Tricuspid valve: There was moderate regurgitation. - Pulmonary arteries: Systolic pressure was mildly increased.  Patient Profile     Mr. Ricardo Watkins is an 67M with CAD s/p RI PCI in 6237, chronic systolic and diastolic heart failure, likely moderate aortic stenosis, prior non-Hodgkin's lymphoma, MDS, ITP, pancytopenia, CKD IV, recurrent pleural effusions with pleur-X cathter in place, diabetes, carotid stenosis, and hypertension here with chest pain.  Assessment & Plan    # Acute on chronic systolic and diastolic heart failure: # Hypertension: Mr. Ricardo Watkins was recently admitted 02/2018 with acute on chronic systolic and diastolic heart failure.  At that time his LVEF was  reduced to 30-35% from 45-50% the preceding month.  Would consider an ischemic evaluation once more stable.  His current presentation is more consistent with demand ischemia in the setting of acute on chronic heart failure then obstruct of coronary disease.  Dobutamine stress echo would also allow for assessment of his aortic stenosis severity.  Will wait until more medically stable.  Ins and outs are not recorded, nor his daily weight.  We will change these orders.  Renal function is stable.  Increase diuresis to  Lasix 40 mg IV twice daily.  Blood pressure somewhat labile.  Continue metoprolol.  # Likely moderate aortic stenosis: On echo 02/2018 he was noted to have severely thickened aortic valve with restricted motion.  On my review of his echo, the valve is certainly calcified and restricted.  Mean gradient was 13 mmHg. This seems to be underestimated, likely due to low-flow, low-gradient aortic stenosis.  On exam S2 is clearly audible.  I suspect that his aortic stenosis is moderate.  If the stenosis were severe, he certainly would not be a surgical candidate.  It is questionable whether he would be a TAVR candidate.  He has CKD IV and would require LHC.  He would also need to be on DAPT and his presenting with a drop in hgb from 9 to 7.9 in the last week while on clopidogrel.  He has many medical conditions that would need to be stabilized before considering this.  Would recommend outpatient evaluation by the TAVR clinic once medically stable.  # MDS: # ITP: # Pancytopenia: H/H stable after 1 unit pRBC.  Per IM.  # CKD IV:  Renal function stable with diuresis.  # PAD:  Continue clopidogrel.  He is not on a statin.  Will check lipids.     For questions or updates, please contact [place] Please consult [email or web address] for contact info under        Signed, Skeet Latch, MD  04/21/2018, 8:16 AM

## 2018-04-21 NOTE — Progress Notes (Signed)
Progress Note    [name]  VCB:[id number] DOB: 01-16-1931  DOA: 04/20/2018 PCP: [name], MD    Brief Narrative:     Medical records reviewed and are as summarized below:  Ricardo Watkins is an 84 y.o. male with medical history significant of coronary artery disease with ischemic cardiomyopathy, chronic combined CHF with an EF of 30 to 35%, also chronic ITP, vitamin B12 deficiency, MDS followed by [name], chronic kidney disease stage III, who presents to the hospital with chief complaint of chest pain.  This was also associated with some shortness of breath, and his symptoms were with waking up this morning.  Chest pain was midsternal, did not go anywhere.  He denies any nausea or vomiting.  It resolved before arriving to the emergency room.  He denies any palpitations.  Cardiology was consulted and recommended admission for chest pain rule out.  Of note, patient had a febrile illness about 5-6 days ago, presented to the emergency room and was discharged on doxycycline for presumed URI, and during that admission blood cultures were obtained.  His blood cultures turn positive for strep viridans as well as coag negative staph, he was asked to come back to the ED and get surveillance cultures on 10/27 which are negative to date.  He also saw his PCP as an outpatient who recommended to continue the doxycycline.  Currently he no longer has any symptoms related to that ER visit.  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 -counts stable  CKD stage IV -trend CR  Anemia -s/p 1 unit PRBC  DM -SSI -lantus at a lower dose  Acute on chronic combined CHF -IV lasix per cards  AS -per cards -outpatient follow up  HTN -metoprolol  BPH -flomax  Family Communication/Anticipated D/C date and plan/Code Status   DVT prophylaxis: scd Code Status: Full Code.  Family Communication: wife/daughter at bedside--- they are hesitant to have him stay in the hospital "his PCP says for [place] not to bring him here and just double his lasix at home and take him to the office" Disposition Plan:    Medical Consultants:    cards    Subjective:   No chest pain-- family at bedside anxious about him being discharged home  Objective:    Vitals:   04/20/18 1900 04/20/18 2130 04/21/18 0359 04/21/18 0949  BP: (!) 128/55 (!) 126/59 (!) 146/61 121/61  Pulse: 84 76 86 89  Resp: 18 20 18 18   Temp: 98.1 F (36.7 C) 98 F (36.7 C) (!) 97.5 F (36.4 C) 98 F (36.7 C)  TempSrc: Oral Oral Oral Oral  SpO2: 93% 93% 97% 94%    Intake/Output Summary (Last 24 hours) at 04/21/2018 1446 Last data filed at 04/21/2018 0700 Gross per 24 hour  Intake 659 ml  Output 925 ml  Net -266 ml   There were no vitals filed for this visit.  Exam: In bed, NAD  Ill appearing, pale diminshed breath sounds rrr   Data Reviewed:   I have personally reviewed following labs and imaging studies:  Labs: Labs show the following:   Basic Metabolic Panel: Recent Labs  Lab 04/14/18 1621  04/16/18 1130 04/20/18 0852 04/21/18 0701  NA  --   --  135 135 136  K  --    < > 5.1 4.1 3.9  CL  --   --  108 102 105  CO2  --   --  20* 21* 25  GLUCOSE  --   --  193* 260* 102*  BUN  --   --  32* 32* 32*  CREATININE  --   --  2.17* 1.82* 1.83*  CALCIUM  --   --  8.4* 8.6* 8.5*  MG 1.8  --   --   --   --    < > = values in  this interval not displayed.   GFR Estimated Creatinine Clearance: 32.3 mL/min (A) (by C-G formula based on SCr of 1.83 mg/dL (H)). Liver Function Tests: Recent Labs  Lab 04/14/18 1621 04/16/18 1130 04/21/18 0701  AST 17 30 14*  ALT 17 14 13   ALKPHOS 51 48 50  BILITOT 0.6 1.0 0.7  PROT 7.1 6.8 7.4  ALBUMIN 3.4* 3.1* 3.1*   Recent Labs  Lab 04/14/18 1621 04/16/18 1130  LIPASE 25 25   No results for input(s): AMMONIA in the last 168 hours. Coagulation profile No results for input(s): INR, PROTIME in the last 168 hours.  CBC: Recent Labs  Lab 04/16/18 1130 04/19/18 1442 04/20/18 0852 04/21/18 0701  WBC 2.9* 3.0* 3.5* 3.3*  NEUTROABS 0.6* 0.8*  --   --   HGB 8.3* 7.8* 7.9* 8.8*  HCT 29.8* 26.8* 28.5* 31.0*  MCV 83.7 83.0 82.8 81.6  PLT 102* 131* 124* PLATELET CLUMPS NOTED ON SMEAR, COUNT APPEARS DECREASED   Cardiac Enzymes: Recent Labs  Lab 04/20/18 1658 04/21/18 0701  TROPONINI 0.03* 0.04*   BNP (last 3 results) No results for input(s): PROBNP in the last 8760 hours. CBG: Recent Labs  Lab 04/20/18 1702 04/20/18 2143 04/21/18 0719 04/21/18 1203  GLUCAP 141* 119* 94 166*   D-Dimer: No results for input(s): DDIMER in the last 72 hours. Hgb A1c: No results for input(s): HGBA1C in the last 72 hours. Lipid Profile: No results for input(s): CHOL, HDL, LDLCALC, TRIG, CHOLHDL, LDLDIRECT in the last 72 hours. Thyroid function studies: No results for input(s): TSH, T4TOTAL, T3FREE, THYROIDAB in the last 72 hours.  Invalid input(s): FREET3 Anemia work up: No results for input(s): VITAMINB12, FOLATE, FERRITIN, TIBC, IRON, RETICCTPCT in the last 72 hours. Sepsis Labs: Recent Labs  Lab 04/14/18 1621 04/16/18 1130 04/19/18 1442 04/20/18 0852 04/21/18 0701  WBC  --  2.9* 3.0* 3.5* 3.3*  LATICACIDVEN 1.6  --   --   --   --     Microbiology Recent Results (from the past 240 hour(s))  Blood Culture ID Panel (Reflexed)     Status: Abnormal   Collection  Time: 04/14/18  3:44 PM  Result Value Ref Range Status   Enterococcus species NOT DETECTED NOT DETECTED Final   Listeria monocytogenes NOT DETECTED NOT DETECTED Final   Staphylococcus species DETECTED (A) NOT DETECTED Final    Comment: Methicillin (oxacillin) susceptible coagulase negative staphylococcus. Possible blood culture contaminant (unless isolated from more than one blood culture draw or clinical case suggests pathogenicity). No antibiotic treatment is indicated for blood  culture contaminants. CRITICAL RESULT  CALLED TO, READ BACK BY AND VERIFIED WITH: RM S MAYNARD 04/16/18 AT 655 AM BY CM    Staphylococcus aureus (BCID) NOT DETECTED NOT DETECTED Final   Methicillin resistance NOT DETECTED NOT DETECTED Final   Streptococcus species NOT DETECTED NOT DETECTED Final   Streptococcus agalactiae NOT DETECTED NOT DETECTED Final   Streptococcus pneumoniae NOT DETECTED NOT DETECTED Final   Streptococcus pyogenes NOT DETECTED NOT DETECTED Final   Acinetobacter baumannii NOT DETECTED NOT DETECTED Final   Enterobacteriaceae species NOT DETECTED NOT DETECTED Final   Enterobacter cloacae complex NOT DETECTED NOT DETECTED Final   Escherichia coli NOT DETECTED NOT DETECTED Final   Klebsiella oxytoca NOT DETECTED NOT DETECTED Final   Klebsiella pneumoniae NOT DETECTED NOT DETECTED Final   Proteus species NOT DETECTED NOT DETECTED Final   Serratia marcescens NOT DETECTED NOT DETECTED Final   Haemophilus influenzae NOT DETECTED NOT DETECTED Final   Neisseria meningitidis NOT DETECTED NOT DETECTED Final   Pseudomonas aeruginosa NOT DETECTED NOT DETECTED Final   Candida albicans NOT DETECTED NOT DETECTED Final   Candida glabrata NOT DETECTED NOT DETECTED Final   Candida krusei NOT DETECTED NOT DETECTED Final   Candida parapsilosis NOT DETECTED NOT DETECTED Final   Candida tropicalis NOT DETECTED NOT DETECTED Final    Comment: Performed at [facility], [place]. [address], [place], [place]  [address]  Blood culture (routine x 2)     Status: Abnormal   Collection Time: 04/14/18  4:23 PM  Result Value Ref Range Status   Specimen Description BLOOD RIGHT ANTECUBITAL  Final   Special Requests   Final    BOTTLES DRAWN AEROBIC AND ANAEROBIC Blood Culture adequate volume   Culture  Setup Time   Final    GRAM POSITIVE COCCI IN CHAINS ANAEROBIC BOTTLE ONLY GRAM POSITIVE COCCI IN CLUSTERS AEROBIC BOTTLE ONLY CRITICAL RESULT CALLED TO, READ BACK BY AND VERIFIED WITH: RN S MAYNARD 04/16/18 AT 85 AM BY CM    Culture (A)  Final    VIRIDANS STREPTOCOCCUS STAPHYLOCOCCUS SPECIES (COAGULASE NEGATIVE) THE SIGNIFICANCE OF ISOLATING THIS ORGANISM FROM A SINGLE SET OF BLOOD CULTURES WHEN MULTIPLE SETS ARE DRAWN IS UNCERTAIN. PLEASE NOTIFY THE MICROBIOLOGY DEPARTMENT WITHIN ONE WEEK IF SPECIATION AND SENSITIVITIES ARE REQUIRED. Performed at [facility], [place] [address], [place],  [address]    Report Status 04/17/2018 FINAL  Final  Blood Culture ID Panel (Reflexed)     Status: Abnormal   Collection Time: 04/14/18  4:23 PM  Result Value Ref Range Status   Enterococcus species NOT DETECTED NOT DETECTED Final   Listeria monocytogenes NOT DETECTED NOT DETECTED Final   Staphylococcus species NOT DETECTED NOT DETECTED Final   Staphylococcus aureus (BCID) NOT DETECTED NOT DETECTED Final   Streptococcus species DETECTED (A) NOT DETECTED Final    Comment: Not Enterococcus species, Streptococcus agalactiae, Streptococcus pyogenes, or Streptococcus pneumoniae. CRITICAL RESULT CALLED TO, READ BACK BY AND VERIFIED WITH: PHARMD PIERCE, K [id number] FCP    Streptococcus agalactiae NOT DETECTED NOT DETECTED Final   Streptococcus pneumoniae NOT DETECTED NOT DETECTED Final   Streptococcus pyogenes NOT DETECTED NOT DETECTED Final   Acinetobacter baumannii NOT DETECTED NOT DETECTED Final   Enterobacteriaceae species NOT DETECTED NOT DETECTED Final   Enterobacter cloacae complex NOT DETECTED NOT  DETECTED Final   Escherichia coli NOT DETECTED NOT DETECTED Final   Klebsiella oxytoca NOT DETECTED NOT DETECTED Final   Klebsiella pneumoniae NOT DETECTED NOT DETECTED Final   Proteus species NOT  DETECTED NOT DETECTED Final   Serratia marcescens NOT DETECTED NOT DETECTED Final   Haemophilus influenzae NOT DETECTED NOT DETECTED Final   Neisseria meningitidis NOT DETECTED NOT DETECTED Final   Pseudomonas aeruginosa NOT DETECTED NOT DETECTED Final   Candida albicans NOT DETECTED NOT DETECTED Final   Candida glabrata NOT DETECTED NOT DETECTED Final   Candida krusei NOT DETECTED NOT DETECTED Final   Candida parapsilosis NOT DETECTED NOT DETECTED Final   Candida tropicalis NOT DETECTED NOT DETECTED Final    Comment: Performed at [facility], [place] [address] [place], [place] [address]  Blood culture (routine x 2)     Status: None   Collection Time: 04/14/18  4:37 PM  Result Value Ref Range Status   Specimen Description BLOOD LEFT HAND  Final   Special Requests   Final    BOTTLES DRAWN AEROBIC AND ANAEROBIC Blood Culture adequate volume   Culture   Final    NO GROWTH 5 DAYS Performed at [facility], [place] [address], [place], [place] [address]    Report Status 04/19/2018 FINAL  Final  Respiratory Panel by PCR     Status: None   Collection Time: 04/14/18  7:43 PM  Result Value Ref Range Status   Adenovirus NOT DETECTED NOT DETECTED Final   Coronavirus 229E NOT DETECTED NOT DETECTED Final   Coronavirus HKU1 NOT DETECTED NOT DETECTED Final   Coronavirus NL63 NOT DETECTED NOT DETECTED Final   Coronavirus OC43 NOT DETECTED NOT DETECTED Final   Metapneumovirus NOT DETECTED NOT DETECTED Final   Rhinovirus / Enterovirus NOT DETECTED NOT DETECTED Final   Influenza A NOT DETECTED NOT DETECTED Final   Influenza B NOT DETECTED NOT DETECTED Final   Parainfluenza Virus 1 NOT DETECTED NOT DETECTED Final   Parainfluenza Virus 2 NOT DETECTED NOT DETECTED Final   Parainfluenza Virus 3 NOT  DETECTED NOT DETECTED Final   Parainfluenza Virus 4 NOT DETECTED NOT DETECTED Final   Respiratory Syncytial Virus NOT DETECTED NOT DETECTED Final   Bordetella pertussis NOT DETECTED NOT DETECTED Final   Chlamydophila pneumoniae NOT DETECTED NOT DETECTED Final   Mycoplasma pneumoniae NOT DETECTED NOT DETECTED Final  Culture, blood (routine x 2)     Status: None   Collection Time: 04/16/18 10:44 AM  Result Value Ref Range Status   Specimen Description BLOOD RIGHT ANTECUBITAL  Final   Special Requests   Final    BOTTLES DRAWN AEROBIC AND ANAEROBIC Blood Culture adequate volume   Culture   Final    NO GROWTH 5 DAYS Performed at [facility], [address] [place], [place] [address]    Report Status 04/21/2018 FINAL  Final  Culture, blood (routine x 2)     Status: None   Collection Time: 04/16/18 11:30 AM  Result Value Ref Range Status   Specimen Description BLOOD BLOOD RIGHT HAND  Final   Special Requests   Final    BOTTLES DRAWN AEROBIC AND ANAEROBIC Blood Culture adequate volume   Culture   Final    NO GROWTH 5 DAYS Performed at [facility], [place] [address] [place], [place] [address]    Report Status 04/21/2018 FINAL  Final    Procedures and diagnostic studies:  Dg Chest 2 View  Result Date: 04/20/2018 CLINICAL DATA:  Chest pain for several hours EXAM: CHEST - 2 VIEW COMPARISON:  04/16/2018 FINDINGS: Cardiac shadow is stable. Aortic calcifications are again seen. Minimal blunting of the costophrenic angles is  noted. No focal infiltrate is seen. No bony abnormality is noted. IMPRESSION: Minimal blunting of the costophrenic angles consistent with mild pleural fluid. No focal infiltrate is seen. Electronically Signed   By: Inez Catalina M.D.   On: 04/20/2018 10:03    Medications:   . clopidogrel  75 mg Oral Daily  . [START ON 04/27/2018] cyanocobalamin  1,000 mcg Subcutaneous Q30 days  . doxycycline  100 mg Oral BID  . furosemide  40 mg Intravenous BID  .  gabapentin  100 mg Oral QHS  . insulin aspart  0-9 Units Subcutaneous TID WC  . insulin glargine  20 Units Subcutaneous [id number]  . metoprolol succinate  100 mg Oral Daily  . multivitamin with minerals  1 tablet Oral Q1400  . tamsulosin  0.4 mg Oral Daily   Continuous Infusions:   LOS: 0 days   Geradine Girt  [facility]   *Please refer to [email or web address], password [email or web address] to get updated schedule on who will round on this patient, as hospitalists switch teams weekly. If 7PM-7AM, please contact night-coverage at [email or web address], password [email or web address] for any overnight needs.  04/21/2018, 2:46 PM

## 2018-04-21 NOTE — Progress Notes (Signed)
Received report by Farley Ly. Was told patient was initially going to be transferred to inpatient but family hesitant about being inpatient, per provider during her shift plan was to diuresis and discharge home tomorrow. Paged admitting to ask about discontinuing inpatient order, provider had no knowledge about plan. Notes written were unclear of plan stated from nurse shift report, provider stated to leave things how they currently are until cleared in morning.

## 2018-04-22 DIAGNOSIS — I350 Nonrheumatic aortic (valve) stenosis: Secondary | ICD-10-CM

## 2018-04-22 DIAGNOSIS — E1122 Type 2 diabetes mellitus with diabetic chronic kidney disease: Secondary | ICD-10-CM | POA: Diagnosis not present

## 2018-04-22 DIAGNOSIS — I5043 Acute on chronic combined systolic (congestive) and diastolic (congestive) heart failure: Secondary | ICD-10-CM | POA: Diagnosis not present

## 2018-04-22 DIAGNOSIS — R079 Chest pain, unspecified: Secondary | ICD-10-CM | POA: Diagnosis not present

## 2018-04-22 LAB — LIPID PANEL
CHOL/HDL RATIO: 4.1 ratio
CHOLESTEROL: 99 mg/dL (ref 0–200)
HDL: 24 mg/dL — ABNORMAL LOW (ref >40)
LDL Cholesterol: 51 mg/dL (ref 0–99)
Triglycerides: 121 mg/dL (ref <150)
VLDL: 24 mg/dL (ref 0–40)

## 2018-04-22 LAB — CBC
HCT: 29.9 % — ABNORMAL LOW (ref 39.0–52.0)
Hemoglobin: 9.1 g/dL — ABNORMAL LOW (ref 13.0–17.0)
MCH: 24.3 pg — AB (ref 26.0–34.0)
MCHC: 30.4 g/dL (ref 30.0–36.0)
MCV: 79.9 fL — ABNORMAL LOW (ref 80.0–100.0)
Platelets: 119 10*3/uL — ABNORMAL LOW (ref 150–400)
RBC: 3.74 MIL/uL — ABNORMAL LOW (ref 4.22–5.81)
RDW: 26 % — AB (ref 11.5–15.5)
WBC: 3.1 10*3/uL — AB (ref 4.0–10.5)
nRBC: 0 % (ref 0.0–0.2)

## 2018-04-22 LAB — GLUCOSE, CAPILLARY
Glucose-Capillary: 122 mg/dL — ABNORMAL HIGH (ref 70–99)
Glucose-Capillary: 205 mg/dL — ABNORMAL HIGH (ref 70–99)

## 2018-04-22 LAB — BASIC METABOLIC PANEL
Anion gap: 8 (ref 5–15)
BUN: 35 mg/dL — AB (ref 8–23)
CALCIUM: 8.7 mg/dL — AB (ref 8.9–10.3)
CO2: 27 mmol/L (ref 22–32)
CREATININE: 1.83 mg/dL — AB (ref 0.61–1.24)
Chloride: 102 mmol/L (ref 98–111)
GFR calc Af Amer: 37 mL/min — ABNORMAL LOW (ref >60)
GFR calc non Af Amer: 32 mL/min — ABNORMAL LOW (ref >60)
GLUCOSE: 133 mg/dL — AB (ref 70–99)
Potassium: 3.8 mmol/L (ref 3.5–5.1)
Sodium: 137 mmol/L (ref 135–145)

## 2018-04-22 MED ORDER — FUROSEMIDE 40 MG PO TABS: ORAL_TABLET | ORAL | 3 refills | Status: DC

## 2018-04-22 MED ORDER — INSULIN GLARGINE 100 UNIT/ML SOLOSTAR PEN: PEN_INJECTOR | SUBCUTANEOUS | 3 refills | Status: DC

## 2018-04-22 NOTE — Discharge Summary (Signed)
Physician Discharge Summary  Ricardo Watkins DPO:[id number] DOB: 1931-02-03 DOA: 04/20/2018  PCP: [name], MD  Admit date: 04/20/2018 Discharge date: 04/22/2018  Admitted From: Home Disposition: Home  Recommendations for Outpatient Follow-up:  1. Follow up with PCP in 1-2 weeks 2. Please obtain BMP/CBC in one week 3. Follow-up with cardiology in 1 to 2 weeks  Home Health: No Equipment/Devices: None  Discharge Condition: Stable CODE STATUS: Full code but would recommend primary discuss CODE STATUS and goals of care Diet recommendation: Heart Healthy 1500 mL fluid restriction  Brief/Interim Summary: Ricardo Watkins is an 84 y.o. male with medical history significant ofcoronary artery disease with ischemic cardiomyopathy, chronic combined CHF with an EF of 30 to 35%, also chronic ITP, vitamin B12 deficiency, MDS followed by Dr.Gorsuch, chronic kidney disease stage III, who presents to the hospital with chief complaint of chest pain. This was also associated with some shortness of breath, and his symptoms were with waking up. Chest pain was midsternal, did not go anywhere. He denies any nausea or vomiting. It resolved before arriving to the emergency room. He denies any palpitations. Cardiology was consulted and recommended admission for chest pain rule out. Of note, patient had a febrile illness about 5-6 days ago, presented to the emergency room and was discharged on doxycycline for presumed URI, and during that admission blood cultures were obtained. His blood cultures turn positive for strep viridans as well as coag negative staph, he was asked to come back to the ED and get surveillance cultures on 10/27 which are negative to date. He also saw his PCP as an outpatient who recommended to continue the doxycycline.Currently he no longer has any symptoms related to that ER visit.  Chest pain -cardiology suspects demand ischemia from volume overload IV lasix now converted to  p.o. Lasix he will discharge on 60 mg daily.  Myelodysplastic syndrome/chronic ITP/chronic neutropenia -follows with [name] -counts stable  CKD stage IV -BMP in 1 week  Anemia -s/p 1 unit PRBC  DM -SSI -lantus at a lower dose  Acute on chronic combined CHF -IV lasix per cards  AS -per cards -outpatient follow up  HTN -metoprolol  BPH -flomax  Patient has reached maximal benefit of hospitalization.  Discharge diagnosis, prognosis, plans, follow-up, medications and treatments discussed with the patient(or responsible party) and is in agreement with the plans as described.  Patient is stable for discharge.  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)   Pancytopenia (HCC)   B12 deficiency anemia   Anemia in chronic kidney disease   Chronic ITP (idiopathic thrombocytopenia) (HCC)   CKD (chronic kidney disease), stage IV (HCC)   CHF (congestive heart failure) (HCC)   Chronic neutropenia (HCC)   Chest pain   Nonrheumatic aortic valve stenosis    Discharge Instructions  Discharge Instructions    (HEART FAILURE PATIENTS) Call MD:  Anytime you have any of the following symptoms: 1) 3 pound weight gain in 24 hours or 5 pounds in 1 week 2) shortness of breath, with or without a dry hacking cough 3) swelling in the hands, feet or stomach 4) if you have to sleep on extra pillows at night in order to breathe.   Complete by:  As directed    Diet - low sodium  heart healthy   Complete by:  As directed    Discharge instructions   Complete by:  As directed    Weigh yourself daily.  Marked results on a calendar.  Look back 1 day, 3 days, and 7 days.  If weight is up by 3 pounds notify your physician about possibly taking extra fluid  medicine.  Limit fluid intake to no more than 1.5 quarts of fluid daily.  That is approximately six 8 ounce glasses of liquid a day.   Heart Failure patients record your daily weight using the same scale at the same time of day   Complete by:  As directed    Increase activity slowly   Complete by:  As directed    STOP any activity that causes chest pain, shortness of breath, dizziness, sweating, or exessive weakness   Complete by:  As directed      Allergies as of 04/22/2018      Reactions   Glucophage [metformin Hydrochloride] Other (See Comments)   Chest pain   Zetia [ezetimibe] Other (See Comments)   weakness   Fenofibrate Rash   Niacin-lovastatin Er Rash      Medication List    TAKE these medications   acetaminophen 500 MG tablet Commonly known as:  TYLENOL Take 1,000 mg by mouth every 6 (six) hours as needed for mild pain, moderate pain or headache.   clopidogrel 75 MG tablet Commonly known as:  PLAVIX TAKE 1 TABLET BY MOUTH ONCE A DAY WITH BREAKFAST What changed:  See the new instructions.   doxycycline 100 MG capsule Commonly known as:  VIBRAMYCIN Take 1 capsule (100 mg total) by mouth 2 (two) times daily. What changed:  Another medication with the same name was removed. Continue taking this medication, and follow the directions you see here.   furosemide 40 MG tablet Commonly known as:  LASIX Take 60mg  daily by mouth. (3 tablets) Except excess swelling or weight gain take an additional 40mg  daily (2 tablets) What changed:  additional instructions   gabapentin 100 MG capsule Commonly known as:  NEURONTIN TAKE 1 CAPSULE BY MOUTH AT BEDTIME   Insulin Glargine 100 UNIT/ML Solostar Pen Commonly known as:  LANTUS INJECT 20 UNITS EVERY NIGHT AT BEDTIME What changed:  additional instructions   loperamide 2 MG capsule Commonly known as:  IMODIUM Take 2 mg by mouth as needed for diarrhea or loose stools.   meclizine 12.5 MG tablet Commonly known as:   ANTIVERT Take 1 tablet (12.5 mg total) by mouth 3 (three) times daily as needed for dizziness.   metoprolol succinate 100 MG 24 hr tablet Commonly known as:  TOPROL-XL TAKE 1 TABLET BY MOUTH ONCE DAILY   multivitamin with minerals Tabs tablet Take 1 tablet by mouth every morning.   NON FORMULARY Inject 1 Dose into the vein once a week. Platelet booster   OVER THE COUNTER MEDICATION Place 1 drop into both eyes daily as needed (dry eyes). Over the counter lubricating eye drop   tamsulosin 0.4 MG Caps capsule Commonly known as:  FLOMAX TAKE 1 CAPSULE BY MOUTH ONCE DAILY   traMADol 50 MG tablet Commonly known as:  ULTRAM Take 1 tablet (50 mg total) by mouth every 6 (six) hours as needed for moderate pain.      Follow-up Information    [name], MD Follow up in 1 week(s).   Specialty:  Family Medicine Contact information: [address] [place] [address] [phone]  [name], MD. Schedule an appointment as soon as possible for a visit in 2 week(s).   Specialty:  Cardiology Contact information: [address] [place] [address] [phone]          Allergies  Allergen Reactions  . Glucophage [Metformin Hydrochloride] Other (See Comments)    Chest pain  . Zetia [Ezetimibe] Other (See Comments)    weakness  . Fenofibrate Rash  . Niacin-Lovastatin Er Rash    Consultations:  C [facility] G cardiology   Procedures/Studies: Ct Abdomen Pelvis Wo Contrast  Result Date: 04/14/2018 CLINICAL DATA:  84 y/o M; dizziness and chills starting this morning. Upper abdominal/chest pain. EXAM: CT ABDOMEN AND PELVIS WITHOUT CONTRAST TECHNIQUE: Multidetector CT imaging of the abdomen and pelvis was performed following the standard protocol without IV contrast. COMPARISON:  06/02/2017 CT of the abdomen and pelvis. FINDINGS: Lower chest: Small right pleural effusion and trace left pleural effusion. Previously identified nodules in the  lung bases are no longer evident, likely having been infectious/inflammatory. Smooth interlobular septal thickening. Opacity of the dependent right lung base. Coronary artery calcific atherosclerosis. Hepatobiliary: Scattered liver calcifications compatible with prior granulomatous disease. No additional focal liver lesion. Normal gallbladder. No biliary ductal dilatation. Pancreas: Unremarkable. No pancreatic ductal dilatation or surrounding inflammatory changes. Spleen: Normal in size without focal abnormality. Adrenals/Urinary Tract: Adrenal glands are unremarkable. Tiny nonobstructing stones in the right kidney. Scattered small kidney hypodensities, likely cysts, measuring up to 17 mm at the right interpolar kidney. No hydronephrosis. Normal bladder. Stomach/Bowel: Stomach is within normal limits. Normal appendix/appendix stump. No evidence of bowel wall thickening, distention, or inflammatory changes. Sigmoid diverticulosis without findings of acute diverticulitis. Vascular/Lymphatic: Aortic atherosclerosis. Right renal artery stent. No enlarged abdominal or pelvic lymph nodes. Reproductive: Brachytherapy seeds. Other: Large right and small left inguinal hernias containing fat. Musculoskeletal: No fracture is seen. Mild lumbar levocurvature. Lumbar spondylosis with prominent facet arthropathy. IMPRESSION: 1. No acute process identified as explanation for abdominal pain. 2. Small right pleural effusion and trace left pleural effusion. 3. Dependent right lower lobe opacity, probably atelectasis, less likely pneumonia. 4. Smooth interlobular septal thickening of the lungs, probably mild interstitial pulmonary edema. 5. Sigmoid diverticulosis without findings of acute diverticulitis. 6. Stable large right and small left inguinal hernias containing fat. 7. Aortic Atherosclerosis (ICD10-I70.0). Coronary artery calcification. Electronically Signed   By: Kristine Garbe M.D.   On: 04/14/2018 19:01   Dg Chest  2 View  Result Date: 04/20/2018 CLINICAL DATA:  Chest pain for several hours EXAM: CHEST - 2 VIEW COMPARISON:  04/16/2018 FINDINGS: Cardiac shadow is stable. Aortic calcifications are again seen. Minimal blunting of the costophrenic angles is noted. No focal infiltrate is seen. No bony abnormality is noted. IMPRESSION: Minimal blunting of the costophrenic angles consistent with mild pleural fluid. No focal infiltrate is seen. Electronically Signed   By: Inez Catalina M.D.   On: 04/20/2018 10:03   Dg Chest 2 View  Result Date: 04/16/2018 CLINICAL DATA:  Daughter stated, they called from here to say he has some kind of infection that showed up later from Friday. Told to come back for different antibiotic. Hx of CAD, CHF, diabetes, DVT, HTN, pleural effusion. Pt states he is having no CP, SOB, N/V/D. EXAM: CHEST - 2 VIEW COMPARISON:  04/14/2018 FINDINGS: Cardiac silhouette top-normal in size. No mediastinal or hilar masses. No evidence of adenopathy. Lungs are clear.  No pleural effusion or pneumothorax. Skeletal structures are intact. IMPRESSION: No active cardiopulmonary disease. Electronically Signed  By: Lajean Manes M.D.   On: 04/16/2018 11:26   Dg Chest 2 View  Result Date: 04/14/2018 CLINICAL DATA:  Fever EXAM: CHEST - 2 VIEW COMPARISON:  02/19/2018 FINDINGS: Small pleural effusions. No focal consolidation. Stable cardiomediastinal silhouette with aortic atherosclerosis. Mild central vascular congestion. No pneumothorax. IMPRESSION: 1. Small pleural effusions without focal consolidation 2. Borderline cardiomegaly with central vascular congestion Electronically Signed   By: Donavan Foil M.D.   On: 04/14/2018 18:16      Subjective: Feeling better.  He has diuresed 5 kilograms while here in the hospital.  Discharge Exam: Vitals:   04/22/18 0955 04/22/18 1224  BP: (!) 127/59 (!) 147/62  Pulse: 98 70  Resp:  16  Temp:  97.9 F (36.6 C)  SpO2:  95%   Vitals:   04/22/18 0751 04/22/18  0932 04/22/18 0955 04/22/18 1224  BP: (!) 163/82  (!) 127/59 (!) 147/62  Pulse: 80  98 70  Resp:    16  Temp:    97.9 F (36.6 C)  TempSrc:    Oral  SpO2: 94% 94%  95%  Weight:        General: Pt is alert, awake, not in acute distress Cardiovascular: RRR, S1/S2 +, no rubs, no gallops Respiratory: CTA bilaterally, no wheezing, no rhonchi Abdominal: Soft, NT, ND, bowel sounds + Extremities: no edema, no cyanosis    The results of significant diagnostics from this hospitalization (including imaging, microbiology, ancillary and laboratory) are listed below for reference.     Microbiology: Recent Results (from the past 240 hour(s))  Blood Culture ID Panel (Reflexed)     Status: Abnormal   Collection Time: 04/14/18  3:44 PM  Result Value Ref Range Status   Enterococcus species NOT DETECTED NOT DETECTED Final   Listeria monocytogenes NOT DETECTED NOT DETECTED Final   Staphylococcus species DETECTED (A) NOT DETECTED Final    Comment: Methicillin (oxacillin) susceptible coagulase negative staphylococcus. Possible blood culture contaminant (unless isolated from more than one blood culture draw or clinical case suggests pathogenicity). No antibiotic treatment is indicated for blood  culture contaminants. CRITICAL RESULT CALLED TO, READ BACK BY AND VERIFIED WITH: RM S MAYNARD 04/16/18 AT 655 AM BY CM    Staphylococcus aureus (BCID) NOT DETECTED NOT DETECTED Final   Methicillin resistance NOT DETECTED NOT DETECTED Final   Streptococcus species NOT DETECTED NOT DETECTED Final   Streptococcus agalactiae NOT DETECTED NOT DETECTED Final   Streptococcus pneumoniae NOT DETECTED NOT DETECTED Final   Streptococcus pyogenes NOT DETECTED NOT DETECTED Final   Acinetobacter baumannii NOT DETECTED NOT DETECTED Final   Enterobacteriaceae species NOT DETECTED NOT DETECTED Final   Enterobacter cloacae complex NOT DETECTED NOT DETECTED Final   Escherichia coli NOT DETECTED NOT DETECTED Final    Klebsiella oxytoca NOT DETECTED NOT DETECTED Final   Klebsiella pneumoniae NOT DETECTED NOT DETECTED Final   Proteus species NOT DETECTED NOT DETECTED Final   Serratia marcescens NOT DETECTED NOT DETECTED Final   Haemophilus influenzae NOT DETECTED NOT DETECTED Final   Neisseria meningitidis NOT DETECTED NOT DETECTED Final   Pseudomonas aeruginosa NOT DETECTED NOT DETECTED Final   Candida albicans NOT DETECTED NOT DETECTED Final   Candida glabrata NOT DETECTED NOT DETECTED Final   Candida krusei NOT DETECTED NOT DETECTED Final   Candida parapsilosis NOT DETECTED NOT DETECTED Final   Candida tropicalis NOT DETECTED NOT DETECTED Final    Comment: Performed at [facility], [place]. [address], [place], [place] [address]  Blood  culture (routine x 2)     Status: Abnormal   Collection Time: 04/14/18  4:23 PM  Result Value Ref Range Status   Specimen Description BLOOD RIGHT ANTECUBITAL  Final   Special Requests   Final    BOTTLES DRAWN AEROBIC AND ANAEROBIC Blood Culture adequate volume   Culture  Setup Time   Final    GRAM POSITIVE COCCI IN CHAINS ANAEROBIC BOTTLE ONLY GRAM POSITIVE COCCI IN CLUSTERS AEROBIC BOTTLE ONLY CRITICAL RESULT CALLED TO, READ BACK BY AND VERIFIED WITH: RN S MAYNARD 04/16/18 AT 49 AM BY CM    Culture (A)  Final    VIRIDANS STREPTOCOCCUS STAPHYLOCOCCUS SPECIES (COAGULASE NEGATIVE) THE SIGNIFICANCE OF ISOLATING THIS ORGANISM FROM A SINGLE SET OF BLOOD CULTURES WHEN MULTIPLE SETS ARE DRAWN IS UNCERTAIN. PLEASE NOTIFY THE MICROBIOLOGY DEPARTMENT WITHIN ONE WEEK IF SPECIATION AND SENSITIVITIES ARE REQUIRED. Performed at [facility], [place] [address], [place], [place] [address]    Report Status 04/17/2018 FINAL  Final  Blood Culture ID Panel (Reflexed)     Status: Abnormal   Collection Time: 04/14/18  4:23 PM  Result Value Ref Range Status   Enterococcus species NOT DETECTED NOT DETECTED Final   Listeria monocytogenes NOT DETECTED NOT DETECTED Final    Staphylococcus species NOT DETECTED NOT DETECTED Final   Staphylococcus aureus (BCID) NOT DETECTED NOT DETECTED Final   Streptococcus species DETECTED (A) NOT DETECTED Final    Comment: Not Enterococcus species, Streptococcus agalactiae, Streptococcus pyogenes, or Streptococcus pneumoniae. CRITICAL RESULT CALLED TO, READ BACK BY AND VERIFIED WITH: PHARMD PIERCE, K [phone] FCP    Streptococcus agalactiae NOT DETECTED NOT DETECTED Final   Streptococcus pneumoniae NOT DETECTED NOT DETECTED Final   Streptococcus pyogenes NOT DETECTED NOT DETECTED Final   Acinetobacter baumannii NOT DETECTED NOT DETECTED Final   Enterobacteriaceae species NOT DETECTED NOT DETECTED Final   Enterobacter cloacae complex NOT DETECTED NOT DETECTED Final   Escherichia coli NOT DETECTED NOT DETECTED Final   Klebsiella oxytoca NOT DETECTED NOT DETECTED Final   Klebsiella pneumoniae NOT DETECTED NOT DETECTED Final   Proteus species NOT DETECTED NOT DETECTED Final   Serratia marcescens NOT DETECTED NOT DETECTED Final   Haemophilus influenzae NOT DETECTED NOT DETECTED Final   Neisseria meningitidis NOT DETECTED NOT DETECTED Final   Pseudomonas aeruginosa NOT DETECTED NOT DETECTED Final   Candida albicans NOT DETECTED NOT DETECTED Final   Candida glabrata NOT DETECTED NOT DETECTED Final   Candida krusei NOT DETECTED NOT DETECTED Final   Candida parapsilosis NOT DETECTED NOT DETECTED Final   Candida tropicalis NOT DETECTED NOT DETECTED Final    Comment: Performed at [facility], [place] [address] [place], [place] [address]  Blood culture (routine x 2)     Status: None   Collection Time: 04/14/18  4:37 PM  Result Value Ref Range Status   Specimen Description BLOOD LEFT HAND  Final   Special Requests   Final    BOTTLES DRAWN AEROBIC AND ANAEROBIC Blood Culture adequate volume   Culture   Final    NO GROWTH 5 DAYS Performed at [facility], [place] [address] [place], [place] [address]    Report Status  04/19/2018 FINAL  Final  Respiratory Panel by PCR     Status: None   Collection Time: 04/14/18  7:43 PM  Result Value Ref Range Status   Adenovirus NOT DETECTED NOT DETECTED Final   Coronavirus 229E NOT DETECTED NOT DETECTED Final   Coronavirus HKU1  NOT DETECTED NOT DETECTED Final   Coronavirus NL63 NOT DETECTED NOT DETECTED Final   Coronavirus OC43 NOT DETECTED NOT DETECTED Final   Metapneumovirus NOT DETECTED NOT DETECTED Final   Rhinovirus / Enterovirus NOT DETECTED NOT DETECTED Final   Influenza A NOT DETECTED NOT DETECTED Final   Influenza B NOT DETECTED NOT DETECTED Final   Parainfluenza Virus 1 NOT DETECTED NOT DETECTED Final   Parainfluenza Virus 2 NOT DETECTED NOT DETECTED Final   Parainfluenza Virus 3 NOT DETECTED NOT DETECTED Final   Parainfluenza Virus 4 NOT DETECTED NOT DETECTED Final   Respiratory Syncytial Virus NOT DETECTED NOT DETECTED Final   Bordetella pertussis NOT DETECTED NOT DETECTED Final   Chlamydophila pneumoniae NOT DETECTED NOT DETECTED Final   Mycoplasma pneumoniae NOT DETECTED NOT DETECTED Final  Culture, blood (routine x 2)     Status: None   Collection Time: 04/16/18 10:44 AM  Result Value Ref Range Status   Specimen Description BLOOD RIGHT ANTECUBITAL  Final   Special Requests   Final    BOTTLES DRAWN AEROBIC AND ANAEROBIC Blood Culture adequate volume   Culture   Final    NO GROWTH 5 DAYS Performed at [facility], [place] [address], [place], [place] [address]    Report Status 04/21/2018 FINAL  Final  Culture, blood (routine x 2)     Status: None   Collection Time: 04/16/18 11:30 AM  Result Value Ref Range Status   Specimen Description BLOOD BLOOD RIGHT HAND  Final   Special Requests   Final    BOTTLES DRAWN AEROBIC AND ANAEROBIC Blood Culture adequate volume   Culture   Final    NO GROWTH 5 DAYS Performed at [facility], [place] [address], [place], [place] [address]    Report Status 04/21/2018 FINAL  Final     Labs: BNP (last 3  results) Recent Labs    01/09/18 1238 02/19/18 0635 04/20/18 0852  BNP 642* 820.7* 130.8*   Basic Metabolic Panel: Recent Labs  Lab 04/16/18 1130 04/20/18 0852 04/21/18 0701 04/22/18 0520  NA 135 135 136 137  K 5.1 4.1 3.9 3.8  CL 108 102 105 102  CO2 20* 21* 25 27  GLUCOSE 193* 260* 102* 133*  BUN 32* 32* 32* 35*  CREATININE 2.17* 1.82* 1.83* 1.83*  CALCIUM 8.4* 8.6* 8.5* 8.7*   Liver Function Tests: Recent Labs  Lab 04/16/18 1130 04/21/18 0701  AST 30 14*  ALT 14 13  ALKPHOS 48 50  BILITOT 1.0 0.7  PROT 6.8 7.4  ALBUMIN 3.1* 3.1*   Recent Labs  Lab 04/16/18 1130  LIPASE 25   No results for input(s): AMMONIA in the last 168 hours. CBC: Recent Labs  Lab 04/16/18 1130 04/19/18 1442 04/20/18 0852 04/21/18 0701 04/22/18 0520  WBC 2.9* 3.0* 3.5* 3.3* 3.1*  NEUTROABS 0.6* 0.8*  --   --   --   HGB 8.3* 7.8* 7.9* 8.8* 9.1*  HCT 29.8* 26.8* 28.5* 31.0* 29.9*  MCV 83.7 83.0 82.8 81.6 79.9*  PLT 102* 131* 124* PLATELET CLUMPS NOTED ON SMEAR, COUNT APPEARS DECREASED 119*   Cardiac Enzymes: Recent Labs  Lab 04/20/18 1658 04/21/18 0701  TROPONINI 0.03* 0.04*   BNP: Invalid input(s): POCBNP CBG: Recent Labs  Lab 04/21/18 1203 04/21/18 1730 04/21/18 2228 04/22/18 0642 04/22/18 1139  GLUCAP 166* 232* 154* 122* 205*   D-Dimer No results for input(s): DDIMER in the last 72 hours. Hgb A1c No results for input(s): HGBA1C in the  last 72 hours. Lipid Profile Recent Labs    04/22/18 0520  CHOL 99  HDL 24*  LDLCALC 51  TRIG 121  CHOLHDL 4.1   Thyroid function studies No results for input(s): TSH, T4TOTAL, T3FREE, THYROIDAB in the last 72 hours.  Invalid input(s): FREET3 Anemia work up No results for input(s): VITAMINB12, FOLATE, FERRITIN, TIBC, IRON, RETICCTPCT in the last 72 hours. Urinalysis    Component Value Date/Time   COLORURINE YELLOW 04/16/2018 1219   APPEARANCEUR CLEAR 04/16/2018 1219   LABSPEC 1.011 04/16/2018 1219   PHURINE 5.0  04/16/2018 1219   GLUCOSEU NEGATIVE 04/16/2018 1219   HGBUR SMALL (A) 04/16/2018 1219   BILIRUBINUR NEGATIVE 04/16/2018 1219   KETONESUR NEGATIVE 04/16/2018 1219   PROTEINUR NEGATIVE 04/16/2018 1219   NITRITE NEGATIVE 04/16/2018 1219   LEUKOCYTESUR NEGATIVE 04/16/2018 1219   Sepsis Labs Invalid input(s): PROCALCITONIN,  WBC,  LACTICIDVEN Microbiology Recent Results (from the past 240 hour(s))  Blood Culture ID Panel (Reflexed)     Status: Abnormal   Collection Time: 04/14/18  3:44 PM  Result Value Ref Range Status   Enterococcus species NOT DETECTED NOT DETECTED Final   Listeria monocytogenes NOT DETECTED NOT DETECTED Final   Staphylococcus species DETECTED (A) NOT DETECTED Final    Comment: Methicillin (oxacillin) susceptible coagulase negative staphylococcus. Possible blood culture contaminant (unless isolated from more than one blood culture draw or clinical case suggests pathogenicity). No antibiotic treatment is indicated for blood  culture contaminants. CRITICAL RESULT CALLED TO, READ BACK BY AND VERIFIED WITH: RM S MAYNARD 04/16/18 AT 655 AM BY CM    Staphylococcus aureus (BCID) NOT DETECTED NOT DETECTED Final   Methicillin resistance NOT DETECTED NOT DETECTED Final   Streptococcus species NOT DETECTED NOT DETECTED Final   Streptococcus agalactiae NOT DETECTED NOT DETECTED Final   Streptococcus pneumoniae NOT DETECTED NOT DETECTED Final   Streptococcus pyogenes NOT DETECTED NOT DETECTED Final   Acinetobacter baumannii NOT DETECTED NOT DETECTED Final   Enterobacteriaceae species NOT DETECTED NOT DETECTED Final   Enterobacter cloacae complex NOT DETECTED NOT DETECTED Final   Escherichia coli NOT DETECTED NOT DETECTED Final   Klebsiella oxytoca NOT DETECTED NOT DETECTED Final   Klebsiella pneumoniae NOT DETECTED NOT DETECTED Final   Proteus species NOT DETECTED NOT DETECTED Final   Serratia marcescens NOT DETECTED NOT DETECTED Final   Haemophilus influenzae NOT DETECTED NOT  DETECTED Final   Neisseria meningitidis NOT DETECTED NOT DETECTED Final   Pseudomonas aeruginosa NOT DETECTED NOT DETECTED Final   Candida albicans NOT DETECTED NOT DETECTED Final   Candida glabrata NOT DETECTED NOT DETECTED Final   Candida krusei NOT DETECTED NOT DETECTED Final   Candida parapsilosis NOT DETECTED NOT DETECTED Final   Candida tropicalis NOT DETECTED NOT DETECTED Final    Comment: Performed at [facility], [place]. [address] [place], [place] [address]  Blood culture (routine x 2)     Status: Abnormal   Collection Time: 04/14/18  4:23 PM  Result Value Ref Range Status   Specimen Description BLOOD RIGHT ANTECUBITAL  Final   Special Requests   Final    BOTTLES DRAWN AEROBIC AND ANAEROBIC Blood Culture adequate volume   Culture  Setup Time   Final    GRAM POSITIVE COCCI IN CHAINS ANAEROBIC BOTTLE ONLY GRAM POSITIVE COCCI IN CLUSTERS AEROBIC BOTTLE ONLY CRITICAL RESULT CALLED TO, READ BACK BY AND VERIFIED WITH: RN S MAYNARD 04/16/18 AT 653 AM BY CM    Culture (A)  Final  VIRIDANS STREPTOCOCCUS STAPHYLOCOCCUS SPECIES (COAGULASE NEGATIVE) THE SIGNIFICANCE OF ISOLATING THIS ORGANISM FROM A SINGLE SET OF BLOOD CULTURES WHEN MULTIPLE SETS ARE DRAWN IS UNCERTAIN. PLEASE NOTIFY THE MICROBIOLOGY DEPARTMENT WITHIN ONE WEEK IF SPECIATION AND SENSITIVITIES ARE REQUIRED. Performed at [facility], [place] [address], [place], [place] [address]    Report Status 04/17/2018 FINAL  Final  Blood Culture ID Panel (Reflexed)     Status: Abnormal   Collection Time: 04/14/18  4:23 PM  Result Value Ref Range Status   Enterococcus species NOT DETECTED NOT DETECTED Final   Listeria monocytogenes NOT DETECTED NOT DETECTED Final   Staphylococcus species NOT DETECTED NOT DETECTED Final   Staphylococcus aureus (BCID) NOT DETECTED NOT DETECTED Final   Streptococcus species DETECTED (A) NOT DETECTED Final    Comment: Not Enterococcus species, Streptococcus agalactiae, Streptococcus pyogenes,  or Streptococcus pneumoniae. CRITICAL RESULT CALLED TO, READ BACK BY AND VERIFIED WITH: PHARMD PIERCE, K [id number] FCP    Streptococcus agalactiae NOT DETECTED NOT DETECTED Final   Streptococcus pneumoniae NOT DETECTED NOT DETECTED Final   Streptococcus pyogenes NOT DETECTED NOT DETECTED Final   Acinetobacter baumannii NOT DETECTED NOT DETECTED Final   Enterobacteriaceae species NOT DETECTED NOT DETECTED Final   Enterobacter cloacae complex NOT DETECTED NOT DETECTED Final   Escherichia coli NOT DETECTED NOT DETECTED Final   Klebsiella oxytoca NOT DETECTED NOT DETECTED Final   Klebsiella pneumoniae NOT DETECTED NOT DETECTED Final   Proteus species NOT DETECTED NOT DETECTED Final   Serratia marcescens NOT DETECTED NOT DETECTED Final   Haemophilus influenzae NOT DETECTED NOT DETECTED Final   Neisseria meningitidis NOT DETECTED NOT DETECTED Final   Pseudomonas aeruginosa NOT DETECTED NOT DETECTED Final   Candida albicans NOT DETECTED NOT DETECTED Final   Candida glabrata NOT DETECTED NOT DETECTED Final   Candida krusei NOT DETECTED NOT DETECTED Final   Candida parapsilosis NOT DETECTED NOT DETECTED Final   Candida tropicalis NOT DETECTED NOT DETECTED Final    Comment: Performed at [facility], [place] [address], [place], [place] [address]  Blood culture (routine x 2)     Status: None   Collection Time: 04/14/18  4:37 PM  Result Value Ref Range Status   Specimen Description BLOOD LEFT HAND  Final   Special Requests   Final    BOTTLES DRAWN AEROBIC AND ANAEROBIC Blood Culture adequate volume   Culture   Final    NO GROWTH 5 DAYS Performed at [facility], [place] [address] [place],  [address]    Report Status 04/19/2018 FINAL  Final  Respiratory Panel by PCR     Status: None   Collection Time: 04/14/18  7:43 PM  Result Value Ref Range Status   Adenovirus NOT DETECTED NOT DETECTED Final   Coronavirus 229E NOT DETECTED NOT DETECTED Final   Coronavirus HKU1 NOT  DETECTED NOT DETECTED Final   Coronavirus NL63 NOT DETECTED NOT DETECTED Final   Coronavirus OC43 NOT DETECTED NOT DETECTED Final   Metapneumovirus NOT DETECTED NOT DETECTED Final   Rhinovirus / Enterovirus NOT DETECTED NOT DETECTED Final   Influenza A NOT DETECTED NOT DETECTED Final   Influenza B NOT DETECTED NOT DETECTED Final   Parainfluenza Virus 1 NOT DETECTED NOT DETECTED Final   Parainfluenza Virus 2 NOT DETECTED NOT DETECTED Final   Parainfluenza Virus 3 NOT DETECTED NOT DETECTED Final   Parainfluenza Virus 4 NOT DETECTED NOT DETECTED Final   Respiratory Syncytial Virus NOT DETECTED NOT DETECTED Final  Bordetella pertussis NOT DETECTED NOT DETECTED Final   Chlamydophila pneumoniae NOT DETECTED NOT DETECTED Final   Mycoplasma pneumoniae NOT DETECTED NOT DETECTED Final  Culture, blood (routine x 2)     Status: None   Collection Time: 04/16/18 10:44 AM  Result Value Ref Range Status   Specimen Description BLOOD RIGHT ANTECUBITAL  Final   Special Requests   Final    BOTTLES DRAWN AEROBIC AND ANAEROBIC Blood Culture adequate volume   Culture   Final    NO GROWTH 5 DAYS Performed at [facility], [address] [place], [place] [address]    Report Status 04/21/2018 FINAL  Final  Culture, blood (routine x 2)     Status: None   Collection Time: 04/16/18 11:30 AM  Result Value Ref Range Status   Specimen Description BLOOD BLOOD RIGHT HAND  Final   Special Requests   Final    BOTTLES DRAWN AEROBIC AND ANAEROBIC Blood Culture adequate volume   Culture   Final    NO GROWTH 5 DAYS Performed at [facility], [place] [address] [place], [place] [address]    Report Status 04/21/2018 FINAL  Final     Time coordinating discharge: Over 45 minutes  SIGNED:   Lady Deutscher, MD  FACP [facility] 04/22/2018, 12:31 PM Pager   If 7PM-7AM, please contact night-coverage [email or web address] Password [email or web address]

## 2018-04-22 NOTE — Discharge Instructions (Signed)
Your insulin dose has changed.  Is now 20 units at bedtime.

## 2018-04-22 NOTE — Progress Notes (Signed)
Progress Note  Patient Name: Ricardo Watkins Date of Encounter: 04/22/2018  Primary Cardiologist: Ricardo Breeding, MD   Subjective   Improved, feels well and would like to go home.  Inpatient Medications    Scheduled Meds: . clopidogrel  75 mg Oral Daily  . [START ON 04/27/2018] cyanocobalamin  1,000 mcg Subcutaneous Q30 days  . doxycycline  100 mg Oral BID  . furosemide  80 mg Intravenous BID  . gabapentin  100 mg Oral QHS  . insulin aspart  0-9 Units Subcutaneous TID WC  . insulin glargine  20 Units Subcutaneous BH-[id number]  . metoprolol succinate  100 mg Oral Daily  . multivitamin with minerals  1 tablet Oral Q1400  . tamsulosin  0.4 mg Oral Daily   Continuous Infusions:  PRN Meds: acetaminophen, meclizine, traMADol   Vital Signs    Vitals:   04/22/18 0557 04/22/18 0751 04/22/18 0932 04/22/18 0955  BP: (!) 155/69 (!) 163/82  (!) 127/59  Pulse: 85 80  98  Resp:      Temp: 98.5 F (36.9 C)     TempSrc: Oral     SpO2: 93% 94% 94%   Weight:        Intake/Output Summary (Last 24 hours) at 04/22/2018 1126 Last data filed at 04/22/2018 0903 Gross per 24 hour  Intake -  Output 1950 ml  Net -1950 ml   Filed Weights   04/22/18 0500  Weight: 82.1 kg    Telemetry    Sinus rhythm.  PVCs.  Ventricular bigeminy.- Personally Reviewed  ECG    04/20/2018: Sinus rhythm.  Rate 81 bpm.  LAFB.  PVC- Personally Reviewed  Physical Exam   VS:  BP (!) 127/59   Pulse 98   Temp 98.5 F (36.9 C) (Oral)   Resp 16   Wt 82.1 kg   SpO2 94%   BMI 26.73 kg/m  , BMI Body mass index is 26.73 kg/m. GENERAL: Chronically ill-appearing HEENT: Pupils equal round and reactive, fundi not visualized, oral mucosa unremarkable NECK: no jugular venous distention, waveform within normal limits, carotid upstroke brisk and symmetric, no bruits, no thyromegaly LUNGS: clear bilaterally HEART:  RRR.  PMI not displaced or sustained,S1 and S2 within normal limits, no S3, no S4, no clicks, no  rubs, III/VI mid-peaking systolic murmur at the left upper sternal border ABD:  Flat, positive bowel sounds normal in frequency in pitch, no bruits, no rebound, no guarding, no midline pulsatile mass, no hepatomegaly, no splenomegaly EXT:  2 plus pulses throughout, no edema, no cyanosis no clubbing SKIN:  No rashes no nodules NEURO:  Cranial nerves II through XII grossly intact, motor grossly intact throughout [facility]:  Cognitively intact, oriented to person place and time   Labs    Chemistry Recent Labs  Lab 04/16/18 1130 04/20/18 0852 04/21/18 0701 04/22/18 0520  NA 135 135 136 137  K 5.1 4.1 3.9 3.8  CL 108 102 105 102  CO2 20* 21* 25 27  GLUCOSE 193* 260* 102* 133*  BUN 32* 32* 32* 35*  CREATININE 2.17* 1.82* 1.83* 1.83*  CALCIUM 8.4* 8.6* 8.5* 8.7*  PROT 6.8  --  7.4  --   ALBUMIN 3.1*  --  3.1*  --   AST 30  --  14*  --   ALT 14  --  13  --   ALKPHOS 48  --  50  --   BILITOT 1.0  --  0.7  --   GFRNONAA 26* 32* 32*  32*  GFRAA 30* 37* 37* 37*  ANIONGAP 7 12 6 8      Hematology Recent Labs  Lab 04/20/18 0852 04/21/18 0701 04/22/18 0520  WBC 3.5* 3.3* 3.1*  RBC 3.44* 3.80* 3.74*  HGB 7.9* 8.8* 9.1*  HCT 28.5* 31.0* 29.9*  MCV 82.8 81.6 79.9*  MCH 23.0* 23.2* 24.3*  MCHC 27.7* 28.4* 30.4  RDW 27.7* 26.0* 26.0*  PLT 124* PLATELET CLUMPS NOTED ON SMEAR, COUNT APPEARS DECREASED 119*    Cardiac Enzymes Recent Labs  Lab 04/20/18 1658 04/21/18 0701  TROPONINI 0.03* 0.04*    Recent Labs  Lab 04/20/18 0858  TROPIPOC 0.02     BNP Recent Labs  Lab 04/20/18 0852  BNP 575.0*     DDimer No results for input(s): DDIMER in the last 168 hours.   Radiology    No results found.  Cardiac Studies   Echo 02/20/2018: Study Conclusions  - Left ventricle: The cavity size was mildly dilated. Systolic   function was moderately to severely reduced. The estimated   ejection fraction was in the range of 30% to 35%. There is   moderate hypokinesis of the  mid-apicalapical myocardium. There is   moderate hypokinesis of the inferior myocardium. Doppler   parameters are consistent with abnormal left ventricular   relaxation (grade 1 diastolic dysfunction). - Aortic valve: Left coronary cusp mobility was moderately   restricted. Transvalvular velocity was increased, due to   stenosis. There was moderate to severe stenosis accounting for   poor systolic function. Valve area (VTI): 2 cm^2. Valve area   (Vmax): 2.12 cm^2. Valve area (Vmean): 1.96 cm^2. - Mitral valve: There was moderate regurgitation. - Left atrium: The atrium was moderately dilated. - Right atrium: The atrium was moderately dilated. - Tricuspid valve: There was moderate regurgitation. - Pulmonary arteries: Systolic pressure was mildly increased.  Patient Profile     Mr. Date is an 12M with CAD s/p RI PCI in 7591, chronic systolic and diastolic heart failure, likely moderate aortic stenosis, prior non-Hodgkin's lymphoma, MDS, ITP, pancytopenia, CKD IV, recurrent pleural effusions with pleur-X cathter in place, diabetes, carotid stenosis, and hypertension here with chest pain.  Assessment & Plan    # Acute on chronic systolic and diastolic heart failure: # Hypertension: Ricardo Watkins was recently admitted 02/2018 with acute on chronic systolic and diastolic heart failure.  At that time his LVEF was reduced to 30-35% from 45-50% the preceding month.  Would consider an ischemic evaluation once more stable.  His current presentation is more consistent with demand ischemia in the setting of acute on chronic heart failure then obstruct of coronary disease.  Dobutamine stress echo would also allow for assessment of his aortic stenosis severity.  Will wait until more medically stable.  - received lasix 80 mg IV two doses and is much improved, I/O not strictly measured, nor weight.  # Likely moderate aortic stenosis: On echo 02/2018 he was noted to have severely thickened aortic valve with  restricted motion.  On my review of his echo, the valve is certainly calcified and restricted.  Mean gradient was 13 mmHg. This seems to be underestimated, likely due to low-flow, low-gradient aortic stenosis.  On exam S2 is clearly audible.  I suspect that his aortic stenosis is moderate.  If the stenosis were severe, he certainly would not be a surgical candidate.  It is questionable whether he would be a TAVR candidate.  He has CKD IV and would require LHC.  He would also need  to be on DAPT and his presenting with a drop in hgb from 9 to 7.9 in the last week while on clopidogrel.  He has many medical conditions that would need to be stabilized before considering this.  Would recommend outpatient evaluation by the TAVR clinic once medically stable.  # MDS: # ITP: # Pancytopenia: H/H stable after 1 unit pRBC.  Per IM.  # CKD IV:  Renal function stable with diuresis.  # PAD:  Continue clopidogrel.  He is not on a statin.    [facility] will sign off.   Medication Recommendations:  Continue home meds Other recommendations (labs, testing, etc):  n/a Follow up as an outpatient:  Has an appt in cardiology in one month with Dr. [name]. Should follow up with PCP in 1-2 weeks for volume assessment.     Signed, Elouise Munroe, MD  04/22/2018, 11:26 AM

## 2018-04-24 ENCOUNTER — Encounter: Payer: Self-pay | Admitting: Podiatry

## 2018-04-24 NOTE — Progress Notes (Addendum)
Subjective: "These nails are causing me pain. I can't cut them. I take a blood thinner and am afraid I will cut myself."  Ricardo Watkins presents today for painful, mycotic toenails b/l feet.   Pain is aggravated when wearing enclosed shoe gear. Pain is getting progressively worse and relieved with periodic professional debridement.  He relates no new problems on today's visit.  PCP: Jenna Luo, MD Last PCP Visit: 04/17/2018  Objective: Vascular Examination: Capillary refill time <3 seconds x 10 digits Dorsalis pedis 0/4 b/l Posterior tibial pulses 1/4 b/l No digital hair x 10 digits Skin temperature warm to warm b/l  No ischemia noted b/l  Dermatological Examination: Skin thin and atrophic b/l Toenails 1-5 b/l discolored, thick, dystrophic with subungual debris and pain with palpation to nailbeds due to thickness of nails. No open wounds No interdigital maceration No hyperkeratotic lesions  Musculoskeletal: Muscle strength 5/5 to all LE muscle groups Uses cane for gait assistance  Neurological: Sensation intact with 10 gram monofilament. Vibratory sensation intact.  Assessment: Painful onychomycosis toenails 1-5 b/l in patient on long term blood thinner  Plan: 1. Toenails 1-5 b/l were debrided in length and girth without iatrogenic bleeding. 2. Patient to continue soft, supportive shoe gear 3. Patient to report any pedal injuries to medical professional immediately. 4. Follow up 3 months. Patient/POA to call should there be a concern in the interim.

## 2018-04-27 ENCOUNTER — Ambulatory Visit: Payer: Medicare Other

## 2018-04-27 ENCOUNTER — Inpatient Hospital Stay: Payer: Medicare Other

## 2018-04-27 ENCOUNTER — Inpatient Hospital Stay: Payer: Medicare Other | Attending: Hematology and Oncology

## 2018-04-27 ENCOUNTER — Ambulatory Visit: Payer: Medicare Other | Admitting: Hematology and Oncology

## 2018-04-27 VITALS — BP 139/67 | HR 66 | Temp 97.7°F | Resp 18

## 2018-04-27 DIAGNOSIS — D631 Anemia in chronic kidney disease: Secondary | ICD-10-CM | POA: Insufficient documentation

## 2018-04-27 DIAGNOSIS — D46C Myelodysplastic syndrome with isolated del(5q) chromosomal abnormality: Secondary | ICD-10-CM

## 2018-04-27 DIAGNOSIS — E1122 Type 2 diabetes mellitus with diabetic chronic kidney disease: Secondary | ICD-10-CM | POA: Diagnosis not present

## 2018-04-27 DIAGNOSIS — D539 Nutritional anemia, unspecified: Secondary | ICD-10-CM

## 2018-04-27 DIAGNOSIS — R072 Precordial pain: Secondary | ICD-10-CM | POA: Diagnosis not present

## 2018-04-27 DIAGNOSIS — D693 Immune thrombocytopenic purpura: Secondary | ICD-10-CM

## 2018-04-27 DIAGNOSIS — N183 Chronic kidney disease, stage 3 unspecified: Secondary | ICD-10-CM

## 2018-04-27 DIAGNOSIS — R918 Other nonspecific abnormal finding of lung field: Secondary | ICD-10-CM | POA: Diagnosis not present

## 2018-04-27 DIAGNOSIS — I130 Hypertensive heart and chronic kidney disease with heart failure and stage 1 through stage 4 chronic kidney disease, or unspecified chronic kidney disease: Secondary | ICD-10-CM | POA: Diagnosis not present

## 2018-04-27 DIAGNOSIS — L03011 Cellulitis of right finger: Secondary | ICD-10-CM | POA: Diagnosis not present

## 2018-04-27 DIAGNOSIS — I25110 Atherosclerotic heart disease of native coronary artery with unstable angina pectoris: Secondary | ICD-10-CM | POA: Diagnosis not present

## 2018-04-27 DIAGNOSIS — J9601 Acute respiratory failure with hypoxia: Secondary | ICD-10-CM

## 2018-04-27 DIAGNOSIS — Z8679 Personal history of other diseases of the circulatory system: Secondary | ICD-10-CM | POA: Diagnosis not present

## 2018-04-27 DIAGNOSIS — N184 Chronic kidney disease, stage 4 (severe): Secondary | ICD-10-CM | POA: Diagnosis not present

## 2018-04-27 DIAGNOSIS — I5042 Chronic combined systolic (congestive) and diastolic (congestive) heart failure: Secondary | ICD-10-CM | POA: Diagnosis not present

## 2018-04-27 DIAGNOSIS — D696 Thrombocytopenia, unspecified: Secondary | ICD-10-CM

## 2018-04-27 LAB — CBC WITH DIFFERENTIAL/PLATELET
ABS IMMATURE GRANULOCYTES: 0.01 10*3/uL (ref 0.00–0.07)
Basophils Absolute: 0 10*3/uL (ref 0.0–0.1)
Basophils Relative: 0 %
EOS ABS: 0 10*3/uL (ref 0.0–0.5)
Eosinophils Relative: 0 %
HCT: 28 % — ABNORMAL LOW (ref 39.0–52.0)
HEMOGLOBIN: 8.3 g/dL — AB (ref 13.0–17.0)
Immature Granulocytes: 0 %
LYMPHS ABS: 1.8 10*3/uL (ref 0.7–4.0)
Lymphocytes Relative: 64 %
MCH: 24.9 pg — ABNORMAL LOW (ref 26.0–34.0)
MCHC: 29.6 g/dL — AB (ref 30.0–36.0)
MCV: 83.8 fL (ref 80.0–100.0)
MONOS PCT: 13 %
Monocytes Absolute: 0.4 10*3/uL (ref 0.1–1.0)
NEUTROS PCT: 23 %
Neutro Abs: 0.7 10*3/uL — ABNORMAL LOW (ref 1.7–7.7)
PLATELETS: 115 10*3/uL — AB (ref 150–400)
RBC: 3.34 MIL/uL — ABNORMAL LOW (ref 4.22–5.81)
RDW: 25.7 % — ABNORMAL HIGH (ref 11.5–15.5)
WBC: 2.8 10*3/uL — ABNORMAL LOW (ref 4.0–10.5)
nRBC: 0 % (ref 0.0–0.2)

## 2018-04-27 MED ORDER — DARBEPOETIN ALFA 200 MCG/0.4ML IJ SOSY
PREFILLED_SYRINGE | INTRAMUSCULAR | Status: AC
  Filled 2018-04-27: qty 0.4

## 2018-04-27 MED ORDER — ROMIPLOSTIM INJECTION 500 MCG
790.0000 ug | SUBCUTANEOUS | Status: DC
  Administered 2018-04-27: 790 ug via SUBCUTANEOUS
  Filled 2018-04-27: qty 1

## 2018-04-27 MED ORDER — DARBEPOETIN ALFA 200 MCG/0.4ML IJ SOSY
200.0000 ug | PREFILLED_SYRINGE | Freq: Once | INTRAMUSCULAR | Status: AC
  Administered 2018-04-27: 200 ug via SUBCUTANEOUS

## 2018-04-27 NOTE — Patient Instructions (Signed)
Darbepoetin Alfa injection What is this medicine? DARBEPOETIN ALFA (dar be POE e tin AL fa) helps your body make more red blood cells. It is used to treat anemia caused by chronic kidney failure and chemotherapy. This medicine may be used for other purposes; ask your health care provider or pharmacist if you have questions. COMMON BRAND NAME(S): Aranesp What should I tell my health care provider before I take this medicine? They need to know if you have any of these conditions: -blood clotting disorders or history of blood clots -cancer patient not on chemotherapy -cystic fibrosis -heart disease, such as angina, heart failure, or a history of a heart attack -hemoglobin level of 12 g/dL or greater -high blood pressure -low levels of folate, iron, or vitamin B12 -seizures -an unusual or allergic reaction to darbepoetin, erythropoietin, albumin, hamster proteins, latex, other medicines, foods, dyes, or preservatives -pregnant or trying to get pregnant -breast-feeding How should I use this medicine? This medicine is for injection into a vein or under the skin. It is usually given by a health care professional in a hospital or clinic setting. If you get this medicine at home, you will be taught how to prepare and give this medicine. Use exactly as directed. Take your medicine at regular intervals. Do not take your medicine more often than directed. It is important that you put your used needles and syringes in a special sharps container. Do not put them in a trash can. If you do not have a sharps container, call your pharmacist or healthcare provider to get one. A special MedGuide will be given to you by the pharmacist with each prescription and refill. Be sure to read this information carefully each time. Talk to your pediatrician regarding the use of this medicine in children. While this medicine may be used in children as young as 1 year for selected conditions, precautions do  apply. Overdosage: If you think you have taken too much of this medicine contact a poison control center or emergency room at once. NOTE: This medicine is only for you. Do not share this medicine with others. What if I miss a dose? If you miss a dose, take it as soon as you can. If it is almost time for your next dose, take only that dose. Do not take double or extra doses. What may interact with this medicine? Do not take this medicine with any of the following medications: -epoetin alfa This list may not describe all possible interactions. Give your health care provider a list of all the medicines, herbs, non-prescription drugs, or dietary supplements you use. Also tell them if you smoke, drink alcohol, or use illegal drugs. Some items may interact with your medicine. What should I watch for while using this medicine? Your condition will be monitored carefully while you are receiving this medicine. You may need blood work done while you are taking this medicine. What side effects may I notice from receiving this medicine? Side effects that you should report to your doctor or health care professional as soon as possible: -allergic reactions like skin rash, itching or hives, swelling of the face, lips, or tongue -breathing problems -changes in vision -chest pain -confusion, trouble speaking or understanding -feeling faint or lightheaded, falls -high blood pressure -muscle aches or pains -pain, swelling, warmth in the leg -rapid weight gain -severe headaches -sudden numbness or weakness of the face, arm or leg -trouble walking, dizziness, loss of balance or coordination -seizures (convulsions) -swelling of the ankles, feet, hands -  unusually weak or tired Side effects that usually do not require medical attention (report to your doctor or health care professional if they continue or are bothersome): -diarrhea -fever, chills (flu-like symptoms) -headaches -nausea, vomiting -redness,  stinging, or swelling at site where injected This list may not describe all possible side effects. Call your doctor for medical advice about side effects. You may report side effects to FDA at 1-800-FDA-1088. Where should I keep my medicine? Keep out of the reach of children. Store in a refrigerator between 2 and 8 degrees C (36 and 46 degrees F). Do not freeze. Do not shake. Throw away any unused portion if using a single-dose vial. Throw away any unused medicine after the expiration date. NOTE: This sheet is a summary. It may not cover all possible information. If you have questions about this medicine, talk to your doctor, pharmacist, or health care provider.  2018 Elsevier/Gold Standard (2016-01-26 19:52:26) Romiplostim injection What is this medicine? ROMIPLOSTIM (roe mi PLOE stim) helps your body make more platelets. This medicine is used to treat low platelets caused by chronic idiopathic thrombocytopenic purpura (ITP). This medicine may be used for other purposes; ask your health care provider or pharmacist if you have questions. COMMON BRAND NAME(S): Nplate What should I tell my health care provider before I take this medicine? They need to know if you have any of these conditions: -cancer or myelodysplastic syndrome -low blood counts, like low white cell, platelet, or red cell counts -take medicines that treat or prevent blood clots -an unusual or allergic reaction to romiplostim, mannitol, other medicines, foods, dyes, or preservatives -pregnant or trying to get pregnant -breast-feeding How should I use this medicine? This medicine is for injection under the skin. It is given by a health care professional in a hospital or clinic setting. A special MedGuide will be given to you before your injection. Read this information carefully each time. Talk to your pediatrician regarding the use of this medicine in children. Special care may be needed. Overdosage: If you think you have taken  too much of this medicine contact a poison control center or emergency room at once. NOTE: This medicine is only for you. Do not share this medicine with others. What if I miss a dose? It is important not to miss your dose. Call your doctor or health care professional if you are unable to keep an appointment. What may interact with this medicine? Interactions are not expected. This list may not describe all possible interactions. Give your health care provider a list of all the medicines, herbs, non-prescription drugs, or dietary supplements you use. Also tell them if you smoke, drink alcohol, or use illegal drugs. Some items may interact with your medicine. What should I watch for while using this medicine? Your condition will be monitored carefully while you are receiving this medicine. Visit your prescriber or health care professional for regular checks on your progress and for the needed blood tests. It is important to keep all appointments. What side effects may I notice from receiving this medicine? Side effects that you should report to your doctor or health care professional as soon as possible: -allergic reactions like skin rash, itching or hives, swelling of the face, lips, or tongue -shortness of breath, chest pain, swelling in a leg -unusual bleeding or bruising Side effects that usually do not require medical attention (report to your doctor or health care professional if they continue or are bothersome): -dizziness -headache -muscle aches -pain in arms and   legs -stomach pain -trouble sleeping This list may not describe all possible side effects. Call your doctor for medical advice about side effects. You may report side effects to FDA at 1-800-FDA-1088. Where should I keep my medicine? This drug is given in a hospital or clinic and will not be stored at home. NOTE: This sheet is a summary. It may not cover all possible information. If you have questions about this medicine,  talk to your doctor, pharmacist, or health care provider.  2018 Elsevier/Gold Standard (2008-02-05 15:13:04)  

## 2018-05-01 ENCOUNTER — Ambulatory Visit: Payer: Medicare Other | Admitting: Internal Medicine

## 2018-05-01 ENCOUNTER — Observation Stay (HOSPITAL_COMMUNITY): Payer: Medicare Other

## 2018-05-01 ENCOUNTER — Emergency Department (HOSPITAL_COMMUNITY): Payer: Medicare Other

## 2018-05-01 ENCOUNTER — Encounter (HOSPITAL_COMMUNITY): Payer: Self-pay | Admitting: Emergency Medicine

## 2018-05-01 ENCOUNTER — Observation Stay (HOSPITAL_COMMUNITY)
Admission: EM | Admit: 2018-05-01 | Discharge: 2018-05-03 | Disposition: A | Discharge status: Home / Self Care | Payer: Medicare Other | Attending: Internal Medicine | Admitting: Internal Medicine

## 2018-05-01 ENCOUNTER — Other Ambulatory Visit: Payer: Self-pay

## 2018-05-01 DIAGNOSIS — Z79899 Other long term (current) drug therapy: Secondary | ICD-10-CM | POA: Insufficient documentation

## 2018-05-01 DIAGNOSIS — L089 Local infection of the skin and subcutaneous tissue, unspecified: Secondary | ICD-10-CM | POA: Insufficient documentation

## 2018-05-01 DIAGNOSIS — I5042 Chronic combined systolic (congestive) and diastolic (congestive) heart failure: Secondary | ICD-10-CM | POA: Diagnosis not present

## 2018-05-01 DIAGNOSIS — E119 Type 2 diabetes mellitus without complications: Secondary | ICD-10-CM

## 2018-05-01 DIAGNOSIS — E538 Deficiency of other specified B group vitamins: Secondary | ICD-10-CM | POA: Diagnosis not present

## 2018-05-01 DIAGNOSIS — I5022 Chronic systolic (congestive) heart failure: Secondary | ICD-10-CM

## 2018-05-01 DIAGNOSIS — D693 Immune thrombocytopenic purpura: Secondary | ICD-10-CM | POA: Diagnosis not present

## 2018-05-01 DIAGNOSIS — I10 Essential (primary) hypertension: Secondary | ICD-10-CM | POA: Diagnosis present

## 2018-05-01 DIAGNOSIS — Z7902 Long term (current) use of antithrombotics/antiplatelets: Secondary | ICD-10-CM | POA: Diagnosis not present

## 2018-05-01 DIAGNOSIS — Z9221 Personal history of antineoplastic chemotherapy: Secondary | ICD-10-CM | POA: Diagnosis not present

## 2018-05-01 DIAGNOSIS — I429 Cardiomyopathy, unspecified: Secondary | ICD-10-CM | POA: Insufficient documentation

## 2018-05-01 DIAGNOSIS — Z791 Long term (current) use of non-steroidal anti-inflammatories (NSAID): Secondary | ICD-10-CM | POA: Diagnosis not present

## 2018-05-01 DIAGNOSIS — E1122 Type 2 diabetes mellitus with diabetic chronic kidney disease: Secondary | ICD-10-CM | POA: Insufficient documentation

## 2018-05-01 DIAGNOSIS — I350 Nonrheumatic aortic (valve) stenosis: Secondary | ICD-10-CM | POA: Diagnosis not present

## 2018-05-01 DIAGNOSIS — D709 Neutropenia, unspecified: Secondary | ICD-10-CM | POA: Diagnosis present

## 2018-05-01 DIAGNOSIS — Z66 Do not resuscitate: Secondary | ICD-10-CM | POA: Diagnosis not present

## 2018-05-01 DIAGNOSIS — I200 Unstable angina: Secondary | ICD-10-CM | POA: Diagnosis not present

## 2018-05-01 DIAGNOSIS — Z86718 Personal history of other venous thrombosis and embolism: Secondary | ICD-10-CM | POA: Insufficient documentation

## 2018-05-01 DIAGNOSIS — S61219A Laceration without foreign body of unspecified finger without damage to nail, initial encounter: Secondary | ICD-10-CM

## 2018-05-01 DIAGNOSIS — E785 Hyperlipidemia, unspecified: Secondary | ICD-10-CM | POA: Insufficient documentation

## 2018-05-01 DIAGNOSIS — D469 Myelodysplastic syndrome, unspecified: Secondary | ICD-10-CM | POA: Diagnosis present

## 2018-05-01 DIAGNOSIS — Z8679 Personal history of other diseases of the circulatory system: Secondary | ICD-10-CM | POA: Diagnosis not present

## 2018-05-01 DIAGNOSIS — Z8546 Personal history of malignant neoplasm of prostate: Secondary | ICD-10-CM | POA: Insufficient documentation

## 2018-05-01 DIAGNOSIS — E1151 Type 2 diabetes mellitus with diabetic peripheral angiopathy without gangrene: Secondary | ICD-10-CM | POA: Diagnosis not present

## 2018-05-01 DIAGNOSIS — D46C Myelodysplastic syndrome with isolated del(5q) chromosomal abnormality: Secondary | ICD-10-CM | POA: Diagnosis not present

## 2018-05-01 DIAGNOSIS — M2540 Effusion, unspecified joint: Secondary | ICD-10-CM

## 2018-05-01 DIAGNOSIS — D631 Anemia in chronic kidney disease: Secondary | ICD-10-CM | POA: Diagnosis not present

## 2018-05-01 DIAGNOSIS — Z8572 Personal history of non-Hodgkin lymphomas: Secondary | ICD-10-CM | POA: Insufficient documentation

## 2018-05-01 DIAGNOSIS — R918 Other nonspecific abnormal finding of lung field: Secondary | ICD-10-CM | POA: Diagnosis not present

## 2018-05-01 DIAGNOSIS — N189 Chronic kidney disease, unspecified: Secondary | ICD-10-CM

## 2018-05-01 DIAGNOSIS — R509 Fever, unspecified: Secondary | ICD-10-CM | POA: Insufficient documentation

## 2018-05-01 DIAGNOSIS — Z794 Long term (current) use of insulin: Secondary | ICD-10-CM | POA: Insufficient documentation

## 2018-05-01 DIAGNOSIS — Z955 Presence of coronary angioplasty implant and graft: Secondary | ICD-10-CM | POA: Insufficient documentation

## 2018-05-01 DIAGNOSIS — Z87891 Personal history of nicotine dependence: Secondary | ICD-10-CM | POA: Insufficient documentation

## 2018-05-01 DIAGNOSIS — J90 Pleural effusion, not elsewhere classified: Secondary | ICD-10-CM | POA: Diagnosis not present

## 2018-05-01 DIAGNOSIS — I25110 Atherosclerotic heart disease of native coronary artery with unstable angina pectoris: Secondary | ICD-10-CM | POA: Diagnosis not present

## 2018-05-01 DIAGNOSIS — D61818 Other pancytopenia: Secondary | ICD-10-CM | POA: Diagnosis not present

## 2018-05-01 DIAGNOSIS — I130 Hypertensive heart and chronic kidney disease with heart failure and stage 1 through stage 4 chronic kidney disease, or unspecified chronic kidney disease: Secondary | ICD-10-CM | POA: Diagnosis not present

## 2018-05-01 DIAGNOSIS — A419 Sepsis, unspecified organism: Secondary | ICD-10-CM | POA: Diagnosis not present

## 2018-05-01 DIAGNOSIS — R079 Chest pain, unspecified: Secondary | ICD-10-CM

## 2018-05-01 DIAGNOSIS — R072 Precordial pain: Secondary | ICD-10-CM | POA: Diagnosis not present

## 2018-05-01 DIAGNOSIS — N184 Chronic kidney disease, stage 4 (severe): Secondary | ICD-10-CM | POA: Diagnosis not present

## 2018-05-01 HISTORY — DX: Fever, unspecified: R50.9

## 2018-05-01 HISTORY — DX: Laceration without foreign body of unspecified finger without damage to nail, initial encounter: S61.219A

## 2018-05-01 LAB — CBC
HCT: 28.7 % — ABNORMAL LOW (ref 39.0–52.0)
Hemoglobin: 8.4 g/dL — ABNORMAL LOW (ref 13.0–17.0)
MCH: 24.5 pg — AB (ref 26.0–34.0)
MCHC: 29.3 g/dL — AB (ref 30.0–36.0)
MCV: 83.7 fL (ref 80.0–100.0)
PLATELETS: UNDETERMINED 10*3/uL (ref 150–400)
RBC: 3.43 MIL/uL — AB (ref 4.22–5.81)
RDW: 25.9 % — AB (ref 11.5–15.5)
WBC: 3.2 10*3/uL — ABNORMAL LOW (ref 4.0–10.5)
nRBC: 0 % (ref 0.0–0.2)

## 2018-05-01 LAB — GLUCOSE, CAPILLARY
GLUCOSE-CAPILLARY: 134 mg/dL — AB (ref 70–99)
Glucose-Capillary: 139 mg/dL — ABNORMAL HIGH (ref 70–99)

## 2018-05-01 LAB — TROPONIN I: TROPONIN I: 0.08 ng/mL — AB (ref <0.03)

## 2018-05-01 LAB — BASIC METABOLIC PANEL
Anion gap: 7 (ref 5–15)
BUN: 36 mg/dL — ABNORMAL HIGH (ref 8–23)
CALCIUM: 8.8 mg/dL — AB (ref 8.9–10.3)
CO2: 23 mmol/L (ref 22–32)
Chloride: 109 mmol/L (ref 98–111)
Creatinine, Ser: 1.87 mg/dL — ABNORMAL HIGH (ref 0.61–1.24)
GFR calc Af Amer: 36 mL/min — ABNORMAL LOW (ref >60)
GFR, EST NON AFRICAN AMERICAN: 31 mL/min — AB (ref >60)
GLUCOSE: 166 mg/dL — AB (ref 70–99)
Potassium: 4 mmol/L (ref 3.5–5.1)
Sodium: 139 mmol/L (ref 135–145)

## 2018-05-01 LAB — LACTIC ACID, PLASMA: Lactic Acid, Venous: 0.9 mmol/L (ref 0.5–1.9)

## 2018-05-01 LAB — CBG MONITORING, ED: Glucose-Capillary: 138 mg/dL — ABNORMAL HIGH (ref 70–99)

## 2018-05-01 LAB — HEMOGLOBIN A1C
HEMOGLOBIN A1C: 8.6 % — AB (ref 4.8–5.6)
Mean Plasma Glucose: 200.12 mg/dL

## 2018-05-01 LAB — I-STAT TROPONIN, ED
TROPONIN I, POC: 0 ng/mL (ref 0.00–0.08)
TROPONIN I, POC: 0.06 ng/mL (ref 0.00–0.08)

## 2018-05-01 LAB — BRAIN NATRIURETIC PEPTIDE: B Natriuretic Peptide: 525.5 pg/mL — ABNORMAL HIGH (ref 0.0–100.0)

## 2018-05-01 LAB — PROCALCITONIN: Procalcitonin: 0.11 ng/mL

## 2018-05-01 MED ORDER — INSULIN ASPART 100 UNIT/ML ~~LOC~~ SOLN: 0.0000 [IU] | Freq: Every day | SUBCUTANEOUS | Status: DC

## 2018-05-01 MED ORDER — ASPIRIN EC 81 MG PO TBEC
81.0000 mg | DELAYED_RELEASE_TABLET | Freq: Every day | ORAL | Status: DC
  Administered 2018-05-02 – 2018-05-03 (×2): 81 mg via ORAL
  Filled 2018-05-01 (×2): qty 1

## 2018-05-01 MED ORDER — METOPROLOL SUCCINATE ER 100 MG PO TB24
100.0000 mg | ORAL_TABLET | Freq: Every day | ORAL | Status: DC
  Administered 2018-05-02 – 2018-05-03 (×2): 100 mg via ORAL
  Filled 2018-05-01 (×2): qty 1

## 2018-05-01 MED ORDER — TAMSULOSIN HCL 0.4 MG PO CAPS
0.4000 mg | ORAL_CAPSULE | Freq: Every day | ORAL | Status: DC
  Administered 2018-05-02 – 2018-05-03 (×2): 0.4 mg via ORAL
  Filled 2018-05-01 (×2): qty 1

## 2018-05-01 MED ORDER — MORPHINE SULFATE (PF) 2 MG/ML IV SOLN: 2.0000 mg | INTRAVENOUS | Status: DC | PRN

## 2018-05-01 MED ORDER — MORPHINE SULFATE (PF) 4 MG/ML IV SOLN
4.0000 mg | Freq: Once | INTRAVENOUS | Status: AC
  Administered 2018-05-01: 4 mg via INTRAVENOUS
  Filled 2018-05-01: qty 1

## 2018-05-01 MED ORDER — ONDANSETRON HCL 4 MG/2ML IJ SOLN: 4.0000 mg | Freq: Four times a day (QID) | INTRAMUSCULAR | Status: DC | PRN

## 2018-05-01 MED ORDER — ENOXAPARIN SODIUM 30 MG/0.3ML ~~LOC~~ SOLN
30.0000 mg | SUBCUTANEOUS | Status: DC
  Administered 2018-05-01 – 2018-05-02 (×2): 30 mg via SUBCUTANEOUS
  Filled 2018-05-01 (×2): qty 0.3

## 2018-05-01 MED ORDER — FUROSEMIDE 10 MG/ML IJ SOLN: 40.0000 mg | Freq: Two times a day (BID) | INTRAMUSCULAR | Status: DC

## 2018-05-01 MED ORDER — CLOPIDOGREL BISULFATE 75 MG PO TABS
75.0000 mg | ORAL_TABLET | Freq: Every day | ORAL | Status: DC
  Administered 2018-05-02 – 2018-05-03 (×2): 75 mg via ORAL
  Filled 2018-05-01 (×2): qty 1

## 2018-05-01 MED ORDER — GABAPENTIN 100 MG PO CAPS
100.0000 mg | ORAL_CAPSULE | Freq: Every day | ORAL | Status: DC
  Administered 2018-05-01 – 2018-05-02 (×2): 100 mg via ORAL
  Filled 2018-05-01 (×2): qty 1

## 2018-05-01 MED ORDER — INSULIN ASPART 100 UNIT/ML ~~LOC~~ SOLN
0.0000 [IU] | Freq: Three times a day (TID) | SUBCUTANEOUS | Status: DC
  Administered 2018-05-01: 2 [IU] via SUBCUTANEOUS
  Administered 2018-05-02: 3 [IU] via SUBCUTANEOUS
  Administered 2018-05-02 – 2018-05-03 (×4): 2 [IU] via SUBCUTANEOUS
  Filled 2018-05-01 (×6): qty 1

## 2018-05-01 MED ORDER — INSULIN GLARGINE 100 UNIT/ML ~~LOC~~ SOLN
20.0000 [IU] | SUBCUTANEOUS | Status: DC
  Administered 2018-05-02 – 2018-05-03 (×2): 20 [IU] via SUBCUTANEOUS
  Filled 2018-05-01 (×3): qty 0.2

## 2018-05-01 MED ORDER — ISOSORBIDE MONONITRATE ER 30 MG PO TB24
30.0000 mg | ORAL_TABLET | Freq: Every day | ORAL | Status: DC
  Administered 2018-05-02: 30 mg via ORAL
  Filled 2018-05-01: qty 1

## 2018-05-01 MED ORDER — HYDROCODONE-ACETAMINOPHEN 5-325 MG PO TABS: 1.0000 | ORAL_TABLET | Freq: Once | ORAL | Status: DC

## 2018-05-01 MED ORDER — FUROSEMIDE 40 MG PO TABS
40.0000 mg | ORAL_TABLET | Freq: Every day | ORAL | Status: DC
  Administered 2018-05-02 – 2018-05-03 (×2): 40 mg via ORAL
  Filled 2018-05-01 (×2): qty 1

## 2018-05-01 MED ORDER — PRAVASTATIN SODIUM 40 MG PO TABS
40.0000 mg | ORAL_TABLET | Freq: Every day | ORAL | Status: DC
  Administered 2018-05-01 – 2018-05-02 (×2): 40 mg via ORAL
  Filled 2018-05-01 (×2): qty 1

## 2018-05-01 MED ORDER — ACETAMINOPHEN 325 MG PO TABS
650.0000 mg | ORAL_TABLET | ORAL | Status: DC | PRN
  Administered 2018-05-01 – 2018-05-03 (×4): 650 mg via ORAL
  Filled 2018-05-01 (×4): qty 2

## 2018-05-01 MED ORDER — TRAMADOL HCL 50 MG PO TABS: 50.0000 mg | ORAL_TABLET | Freq: Four times a day (QID) | ORAL | Status: DC | PRN

## 2018-05-01 NOTE — ED Notes (Signed)
Family at bedside. 

## 2018-05-01 NOTE — ED Provider Notes (Signed)
[facility] EMERGENCY DEPARTMENT Provider Note   CSN: [id number] Arrival date & time: 05/01/18  0544     History   Chief Complaint Chief Complaint  Patient presents with  . Chest Pain    HPI Ricardo Watkins is a 84 y.o. male with a history of chronic ITP, CKD stage III, CAD s/p PTCA (2004), non-Hodgkin's lymphoma, recurrent pleural effusions, hypertension, hyperlipidemia who presents the emergency department today for chest pain.  Patient endorses awakening at 4 AM this morning with a sudden onset of substernal chest pain, characterized by tightness that awoke him out of his sleep and was associated with shortness of breath.  He reports the pain was nonradiating (no radiation to the neck, jaw, back, abdomen or either shoulder).  He reports his chest pain and dyspnea improved when walking around the house but not completely resolved.  His wife gave him 80 mg of Lasix as well as a "pain pill".  He notes that around 6 AM his pain resolved.  He is unsure if his pain is worse with position or deep breathing.   Patient was recently treated for pneumonia with doxycycline on 10/28.  He denies any fever, chills or productive cough.  He notes he has had a ongoing dry cough for the last several weeks.    He reports he does have chronic bilateral lower extremity swelling.  The patient was seen here on 10/31 for the same.  He was admitted by hospitalist with cardiology following for recommended dobutamine stress test. He did not receive this as he was not medically stable to have this prior to discharge.   He denies abdominal pain, N/V/D, urinary symptoms.   His primary cardiologist is Dr. [name]  HPI  Past Medical History:  Diagnosis Date  . Adenomatous colon polyp   . CAD (coronary artery disease)    30% LAD Stenosis, 70% ramus intermedius stenosis, treated with PTCA and angioplasty by Dr Albertine Patricia 2004  . Cataract    right eye  . CHF (congestive heart failure) ([place])   .  Chronic ITP (idiopathic thrombocytopenia) (HCC)   . Cough, persistent 11/04/2015  . Deficiency anemia 04/19/2014  . Diverticulosis   . DM (diabetes mellitus) ([place])   . DVT (deep venous thrombosis) ([place])    secondary to surgery  . Dyslipidemia   . Dyspnea   . Hyperlipidemia   . Hypertension   . Inguinal hernia    right  . 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 Microcytic anemia 07/07/2015  . 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 Non Hodgkin's lymphoma ([place])   . Pleural effusion, left 11/04/2015  . Prostate CA ([place]) 09/10/2011   Gleason 3+3 R, 3+4 L lobe May 2007 Rx Radioactive seed implants Dr [name]  . PVD (peripheral vascular disease) ([place])    rt renal artery stent  . Renal insufficiency   . Thrombocytopenia ([place])   . 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 Thyroid nodule    left lower lobe (annual monitoring).  . Vitamin D deficiency     Patient Active Problem List   Diagnosis Date Noted  . Acute on chronic combined systolic and diastolic CHF (congestive heart failure) ([place]) 04/22/2018  . Nonrheumatic aortic valve stenosis   . Chest pain 04/20/2018  . Chronic neutropenia ([place]) 03/02/2018  . CHF (congestive  heart failure) ([place]) 02/19/2018  . Upper airway cough syndrome 10/27/2017  . Abnormal CT of the chest 07/27/2017  . Flu-like symptoms 06/02/2017  . Elevated lactic acid level 06/02/2017  . Acute kidney injury superimposed on chronic kidney disease ([place]) 06/02/2017  . CKD (chronic kidney disease), stage IV ([place]) 12/16/2016  . Bilateral carotid artery stenosis 12/11/2016  . Dyslipidemia 12/11/2016  . Anemia in chronic kidney disease 07/01/2016  . Chronic ITP (idiopathic thrombocytopenia) (HCC) 07/01/2016  . B12 deficiency anemia   . Chest tube in place   . Acute diastolic CHF (congestive heart failure) ([place])  04/16/2016  . Acute respiratory failure with hypoxia ([place]) 04/14/2016  . Empyema lung ([place])   . Type 2 diabetes mellitus ([place]) 04/04/2016  . Empyema ([place]) 04/03/2016  . Thyroid nodule 03/03/2016  . Sepsis ([place]) 02/17/2016  . DOE (dyspnea on exertion) 01/27/2016  . Pulmonary edema with congestive heart failure ([place]) 11/12/2015  . Cough, persistent 11/04/2015  . Pleural effusion, left 11/04/2015  . Anorexia 08/08/2015  . Microcytic anemia 07/07/2015  . Pancytopenia ([place]) 04/08/2015  . Diarrhea 04/08/2015  . Encounter for chemotherapy management 04/02/2015  . Neutropenia ([place]) 02/18/2015  . 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 Deficiency anemia 04/19/2014  . Thrombocytopenia ([place]) 04/19/2014  . Vitamin B12 deficiency 04/19/2014  . Chronic renal failure, stage 3 (moderate) ([place]) 04/19/2014  . Macrocytic anemia 03/20/2013  . Monocytosis 03/20/2013  . Thrombotic stroke ([place]) 09/10/2011  . Prostate CA ([place]) 09/10/2011  . Inguinal hernia   . Colon polyps   . CAD (coronary artery disease) 08/26/2010  . HTN (hypertension) 08/26/2010  . Murmur 08/26/2010  . MURMUR 08/26/2010  . History of lymphoma 08/25/2010  . Diabetes ([place]) 08/25/2010  . DYSLIPIDEMIA 08/25/2010  . THROMBOCYTOPENIA 08/25/2010  . Essential hypertension 08/25/2010  . Coronary atherosclerosis 08/25/2010  . PVD 08/25/2010    Past Surgical History:  Procedure Laterality Date  . APPENDECTOMY     patient ?  Marland Kitchen CARDIAC CATHETERIZATION    . CHEST TUBE INSERTION Left 02/10/2016   Procedure: INSERTION PLEURAL DRAINAGE CATHETER;  Surgeon: [name], MD;  Location: [facility];  Service: Thoracic;  Laterality: Left;  . CHEST TUBE INSERTION Left 04/08/2016   Procedure: CHEST TUBE INSERTION;  Surgeon: [name], MD;  Location: [facility];  Service: Thoracic;  Laterality: Left;  . COLONOSCOPY    . CORONARY ANGIOPLASTY    . CYSTOURETHROSCOPY     ROBOTIC ARM NUCLETRON SEED IMPLANTATION OF  PROSTATE  . EXPLORATORY LAPAROTOMY     For evaluation of lymphoma  . REMOVAL OF PLEURAL DRAINAGE CATHETER Left 04/08/2016   Procedure: REMOVAL OF PLEURAL DRAINAGE CATHETER;  Surgeon: [name], MD;  Location: [facility];  Service: Thoracic;  Laterality: Left;        Home Medications    Prior to Admission medications   Medication Sig Start Date End Date Taking? Authorizing Provider  acetaminophen (TYLENOL) 500 MG tablet Take 1,000 mg by mouth every 6 (six) hours as needed for mild pain, moderate pain or headache.     [provider]  clopidogrel (PLAVIX) 75 MG tablet TAKE 1 TABLET BY MOUTH ONCE A DAY WITH BREAKFAST Patient taking differently: Take 75 mg by mouth daily.  04/10/18   [name], MD  doxycycline (VIBRAMYCIN) 100 MG capsule Take 1 capsule (100 mg total) by mouth 2 (two) times daily. 04/14/18   Virgel Manifold, MD  furosemide (LASIX) 40 MG tablet Take 60mg  daily by  mouth. (3 tablets) Except excess swelling or weight gain take an additional 40mg  daily (2 tablets) 04/22/18   Lady Deutscher, MD  gabapentin (NEURONTIN) 100 MG capsule TAKE 1 CAPSULE BY MOUTH AT BEDTIME Patient taking differently: Take 100 mg by mouth at bedtime.  03/20/18   [name], MD  Insulin Glargine (LANTUS SOLOSTAR) 100 UNIT/ML Solostar Pen INJECT 20 UNITS EVERY NIGHT AT BEDTIME 04/22/18   Lady Deutscher, MD  loperamide (IMODIUM) 2 MG capsule Take 2 mg by mouth as needed for diarrhea or loose stools.    [provider]  meclizine (ANTIVERT) 12.5 MG tablet Take 1 tablet (12.5 mg total) by mouth 3 (three) times daily as needed for dizziness. 06/27/15   [name], MD  metoprolol succinate (TOPROL-XL) 100 MG 24 hr tablet TAKE 1 TABLET BY MOUTH ONCE DAILY Patient taking differently: Take 100 mg by mouth daily.  12/28/17   [name], MD  Multiple Vitamin (MULTIVITAMIN WITH MINERALS) TABS tablet Take 1 tablet by mouth every morning.     [provider]  NON  FORMULARY Inject 1 Dose into the vein once a week. Platelet booster    [provider]  OVER THE COUNTER MEDICATION Place 1 drop into both eyes daily as needed (dry eyes). Over the counter lubricating eye drop    [provider]  tamsulosin (FLOMAX) 0.4 MG CAPS capsule TAKE 1 CAPSULE BY MOUTH ONCE DAILY Patient taking differently: Take 0.4 mg by mouth daily.  04/11/18   [name], MD  traMADol (ULTRAM) 50 MG tablet Take 1 tablet (50 mg total) by mouth every 6 (six) hours as needed for moderate pain. 01/03/18   [name], MD    Family History Family History  Problem Relation Age of Onset  . Lymphoma Sister   . Prostate cancer Brother   . Breast cancer Sister   . Diabetes Brother   . Diabetes Sister   . Heart disease Brother   . Asthma Son     Social History Social History   Tobacco Use  . Smoking status: Former Smoker    Packs/day: 0.50    Years: 20.00    Pack years: 10.00    Types: Pipe, Cigarettes    Last attempt to quit: 06/21/1958    Years since quitting: 59.9  . Smokeless tobacco: Never Used  Substance Use Topics  . Alcohol use: No    Alcohol/week: 0.0 standard drinks  . Drug use: No     Allergies   Glucophage [metformin hydrochloride]; Zetia [ezetimibe]; Fenofibrate; and Niacin-lovastatin er   Review of Systems Review of Systems  All other systems reviewed and are negative.    Physical Exam Updated Vital Signs BP (!) 175/78   Pulse 98   Temp 97.9 F (36.6 C) (Oral)   Resp (!) 24   Ht 5\' 9"  (1.753 m)   Wt 82.1 kg   SpO2 96%   BMI 26.73 kg/m   Physical Exam  Constitutional: He appears well-developed and well-nourished.  HENT:  Head: Normocephalic and atraumatic.  Right Ear: External ear normal.  Left Ear: External ear normal.  Nose: Nose normal.  Mouth/Throat: Uvula is midline, oropharynx is clear and moist and mucous membranes are normal. No tonsillar exudate.  Eyes: Pupils are equal, round, and reactive to light.  Right eye exhibits no discharge. Left eye exhibits no discharge. No scleral icterus.  Neck: Trachea normal. Neck supple. No spinous process tenderness present. No neck rigidity. Normal range  of motion present.  Cardiovascular: Normal rate, regular rhythm and intact distal pulses.  Murmur heard.  Systolic murmur is present. Pulses:      Radial pulses are 2+ on the right side, and 2+ on the left side.       Dorsalis pedis pulses are 2+ on the right side, and 2+ on the left side.       Posterior tibial pulses are 2+ on the right side, and 2+ on the left side.  1+ lower extremity edema b/l.   Pulmonary/Chest: Effort normal. He has decreased breath sounds in the right lower field and the left lower field. He exhibits no tenderness.  Abdominal: Soft. Bowel sounds are normal. There is no tenderness. There is no rebound and no guarding.  Musculoskeletal: He exhibits no edema.  Lymphadenopathy:    He has no cervical adenopathy.  Neurological: He is alert.  Skin: Skin is warm and dry. No rash noted. He is not diaphoretic.  Psychiatric: He has a normal mood and affect.  Nursing note and vitals reviewed.    ED Treatments / Results  Labs (all labs ordered are listed, but only abnormal results are displayed) Labs Reviewed  BASIC METABOLIC PANEL - Abnormal; Notable for the following components:      Result Value   Glucose, Bld 166 (*)    BUN 36 (*)    Creatinine, Ser 1.87 (*)    Calcium 8.8 (*)    GFR calc non Af Amer 31 (*)    GFR calc Af Amer 36 (*)    All other components within normal limits  CBC - Abnormal; Notable for the following components:   WBC 3.2 (*)    RBC 3.43 (*)    Hemoglobin 8.4 (*)    HCT 28.7 (*)    MCH 24.5 (*)    MCHC 29.3 (*)    RDW 25.9 (*)    All other components within normal limits  BRAIN NATRIURETIC PEPTIDE - Abnormal; Notable for the following components:   B Natriuretic Peptide 525.5 (*)    All other components within normal limits  I-STAT TROPONIN, ED    I-STAT TROPONIN, ED    EKG EKG Interpretation  Date/Time:  Monday May 01 2018 05:51:41 EST Ventricular Rate:  105 PR Interval:    QRS Duration: 125 QT Interval:  361 QTC Calculation: 478 R Axis:   -50 Text Interpretation:  Sinus tachycardia Left bundle branch block Confirmed by Orpah Greek [phone]) on 05/01/2018 6:09:50 AM   EKG Interpretation  Date/Time:  Monday May 01 2018 10:34:42 EST Ventricular Rate:  104 PR Interval:  160 QRS Duration: 114 QT Interval:  366 QTC Calculation: 481 R Axis:   -37 Text Interpretation:  Sinus tachycardia with frequent Premature ventricular complexes Left axis deviation Non-specific ST-t changes No significant change since last tracing Confirmed by Lajean Saver [phone]) on 05/01/2018 11:34:39 AM        Radiology Dg Chest 2 View  Result Date: 05/01/2018 CLINICAL DATA:  Acute onset of central chest pain and cough. EXAM: CHEST - 2 VIEW COMPARISON:  Chest radiograph performed 04/20/2018 FINDINGS: The lungs are well-aerated. Medial right basilar airspace opacity may reflect pneumonia or asymmetric pulmonary edema. Mild vascular congestion is noted. A small left pleural effusion is suspected. There is no evidence of pneumothorax. The heart is normal in size; the mediastinal contour is within normal limits. No acute osseous abnormalities are seen. IMPRESSION: Medial right basilar airspace opacity may reflect pneumonia or asymmetric pulmonary edema.  Mild vascular congestion noted. Suspect small left pleural effusion. Electronically Signed   By: Garald Balding M.D.   On: 05/01/2018 06:36    Procedures Procedures (including critical care time)  Medications Ordered in ED Medications  isosorbide mononitrate (IMDUR) 24 hr tablet 30 mg (has no administration in time range)  pravastatin (PRAVACHOL) tablet 40 mg (has no administration in time range)  metoprolol succinate (TOPROL-XL) 24 hr tablet 100 mg (has no administration in time  range)  morphine 4 MG/ML injection 4 mg (4 mg Intravenous Given 05/01/18 1043)     Initial Impression / Assessment and Plan / ED Course  I have reviewed the triage vital signs and the nursing notes.  Pertinent labs & imaging results that were available during my care of the patient were reviewed by me and considered in my medical decision making (see chart for details).     84 y.o. male with a history of chronic ITP, CKD stage III, CAD s/p PTCA (2004), non-Hodgkin's lymphoma, recurrent pleural effusions, hypertension, hyperlipidemia who presents the emergency department today for chest pain substernal chest pain that does not radiate and is associated with dyspnea and began at 4 AM this morning when the pain woke the patient up from his sleep.  He is tried 80 mg of Lasix as well as a "pain pill".  His pain resolved at 6 AM.  He is currently pain-free. Patient was recently treated for pneumonia with doxycycline on 10/28.  He denies any fever, chills or productive cough.  He notes he has had a ongoing dry cough for the last several weeks.  He reports he does have chronic bilateral lower extremity swelling.  The patient was seen here on 10/31 for the same.  He was admitted by hospitalist with cardiology following for recommended dobutamine stress test. He did not receive this as he was not medically stable to have this prior to discharge.   Vital signs are reassuring on presentation.  EKG without STEMI. LBBB noted. Tn wnl. Labs appear stable from prior.  Chest x-ray with asymmetric pulmonary edema, mild vascular congestion and small left pleural effusion.  Will consult cardiology.  8:09 AM Discussed with Dr. Marlou Porch of cardiology who will see the patient. Family updated on plan.   10:34 AM Patient had return of pain. No SOB. No N/V or diaphoresis. Repeat EKG and Tn ordered. Pain medication ordered.   Patient is noted to have elevation in Tn from 0.00 on arrival to now 0.06. EKG reviewed by Dr.  Delane Ginger and at baseline. Patient pain has resolved.   12:46 PM Repaged cardiology  1:17 PM Dr. Marlou Porch of cardiology has returned my page. I appreciate this. The patient is on the list and they will come see the patient. Updated family.   2:18 PM  Cardiology at bedside to see the patient  2:55 PM Cardiology plans to follow. Will add anti-anginal medication. Plan for admission to triad.   3:22 PM Dr. Lorin Mercy has agreed to admit the patient. I appreciate this. Patient appears safe for admission.   Final Clinical Impressions(s) / ED Diagnoses   Final diagnoses:  Chest pain in adult  History of CHF (congestive heart failure)    ED Discharge Orders    None       Jillyn Ledger, PA-C 05/01/18 1523    Orpah Greek, MD 05/03/18 [phone]

## 2018-05-01 NOTE — H&P (Addendum)
History and Physical    Ricardo Watkins TKZ:[id number] DOB: [birth year] DOA: 05/01/2018  PCP: [name], MD Consultants:  [name] - cardiology; [name] - oncology; [name] - pulmonology; [name] - podiatry Patient coming from:  Home - lives with wife and son; [name]: Wife, [phone]  Chief Complaint: Chest pain  HPI: Ricardo Watkins is a 84 y.o. male with medical history significant of PVD s/p R renal artery stent; remote prostate CA s/p seed radiation; NHL; HTN; HLD; DM; chronic ITP; CHF; and CAD s/p stent presenting with chest pain.   He was hospitalized last week for chest pain.  He was doing pretty well.  He awoke this AM about 0400 with tightness in his chest.  He was asleep when it started.   The pain lasted about an hour.  It improved after he took a pain pill, Tramadol.  It recurred while in the ER and lasted about 3 hours.  He was given more pain medication and it resolved.  He never received NTG.   Last stress test was 2012.  No h/o CAD requiring stent.  "He's not a candidate for heart cath - his heart's too weak and he's a bleeder, according to [name]."  He fell yesterday and had a number of abrasions.    ED Course:  Observation last week for chest pain.  Cardiology planned to do a dobutamine stress but decided risks>benefits.  Recurrent CP today.  Resolved with pain pill and Lasix.  EKG unchanged.  Troponin 0 and 0.06.  Cardiology consulted, will increase anti-anginal medication and will follow along.  He is chest pain free currently.  Review of Systems: As per HPI; otherwise review of systems reviewed and negative.   Ambulatory Status:  Ambulates with a cane  Past Medical History:  Diagnosis Date  . Adenomatous colon polyp   . CAD (coronary artery disease)    30% LAD Stenosis, 70% ramus intermedius stenosis, treated with PTCA and angioplasty by Dr Albertine Patricia 2004  . Cataract    right eye  . CHF (congestive heart failure) ([place])   . Chronic ITP (idiopathic thrombocytopenia)  (HCC)   . Cough, persistent 11/04/2015  . Diverticulosis   . DM (diabetes mellitus) ([place])   . DVT (deep venous thrombosis) ([place])    secondary to surgery  . Dyspnea   . Fever 05/01/2018  . Hyperlipidemia   . Hypertension   . Inguinal hernia    right  . Laceration of finger of right hand 05/01/2018   INDEX FINGER  . 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 Microcytic anemia 07/07/2015  . 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 Non Hodgkin's lymphoma ([place])   . Pleural effusion, left 11/04/2015  . Prostate CA ([place]) 09/10/2011   Gleason 3+3 R, 3+4 L lobe May 2007 Rx Radioactive seed implants Dr [name]  . PVD (peripheral vascular disease) ([place])    rt renal artery stent  . Renal insufficiency   . Thrombocytopenia ([place])   . 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 Thyroid nodule    left lower lobe (annual monitoring).  . Vitamin D deficiency     Past Surgical History:  Procedure Laterality Date  . APPENDECTOMY     patient ?  Marland Kitchen CARDIAC CATHETERIZATION    . CATARACT EXTRACTION    . CHEST TUBE INSERTION Left  02/10/2016   Procedure: INSERTION PLEURAL DRAINAGE CATHETER;  Surgeon: [name], MD;  Location: [facility];  Service: Thoracic;  Laterality: Left;  . CHEST TUBE INSERTION Left 04/08/2016   Procedure: CHEST TUBE INSERTION;  Surgeon: [name], MD;  Location: [facility];  Service: Thoracic;  Laterality: Left;  . COLONOSCOPY    . CORONARY ANGIOPLASTY    . CYSTOURETHROSCOPY     ROBOTIC ARM NUCLETRON SEED IMPLANTATION OF PROSTATE  . EXPLORATORY LAPAROTOMY     For evaluation of lymphoma  . REMOVAL OF PLEURAL DRAINAGE CATHETER Left 04/08/2016   Procedure: REMOVAL OF PLEURAL DRAINAGE CATHETER;  Surgeon: [name], MD;  Location: [facility] OR;  Service: Thoracic;  Laterality: Left;    Social History   Socioeconomic History  .  Marital status: Married    Spouse name: Not on file  . Number of children: 4  . Years of education: Not on file  . Highest education level: Not on file  Occupational History  . Occupation: retired  Scientific laboratory technician  . Financial resource strain: Not on file  . Food insecurity:    Worry: Not on file    Inability: Not on file  . Transportation needs:    Medical: Not on file    Non-medical: Not on file  Tobacco Use  . Smoking status: Former Smoker    Packs/day: 0.50    Years: 20.00    Pack years: 10.00    Types: Pipe, Cigarettes    Last attempt to quit: 06/21/1958    Years since quitting: 59.9  . Smokeless tobacco: Never Used  Substance and Sexual Activity  . Alcohol use: No    Alcohol/week: 0.0 standard drinks  . Drug use: No  . Sexual activity: Yes    Partners: Female  Lifestyle  . Physical activity:    Days per week: Not on file    Minutes per session: Not on file  . Stress: Not on file  Relationships  . Social connections:    Talks on phone: Not on file    Gets together: Not on file    Attends religious service: Not on file    Active member of club or organization: Not on file    Attends meetings of clubs or organizations: Not on file    Relationship status: Not on file  . Intimate partner violence:    Fear of current or ex partner: Not on file    Emotionally abused: Not on file    Physically abused: Not on file    Forced sexual activity: Not on file  Other Topics Concern  . Not on file  Social History Narrative  . Not on file    Allergies  Allergen Reactions  . Glucophage [Metformin Hydrochloride] Other (See Comments)    Chest pain  . Zetia [Ezetimibe] Other (See Comments)    weakness  . Fenofibrate Rash  . Niacin-Lovastatin Er Rash    Family History  Problem Relation Age of Onset  . Lymphoma Sister   . Prostate cancer Brother   . Breast cancer Sister   . Diabetes Brother   . Diabetes Sister   . Heart disease Brother   . Asthma Son     Prior to  Admission medications   Medication Sig Start Date End Date Taking? Authorizing Provider  acetaminophen (TYLENOL) 500 MG tablet Take 1,000 mg by mouth every 6 (six) hours as needed for mild pain, moderate pain or headache.    Yes [provider]  clopidogrel (PLAVIX) 75 MG tablet TAKE 1 TABLET BY MOUTH ONCE A DAY WITH BREAKFAST Patient taking differently: Take 75 mg by mouth daily.  04/10/18  Yes [name], MD  furosemide (LASIX) 40 MG tablet Take 60mg  daily by mouth. (3 tablets) Except excess swelling or weight gain take an additional 40mg  daily (2 tablets) Patient taking differently: Take 40 mg by mouth daily.  04/22/18  Yes Lady Deutscher, MD  gabapentin (NEURONTIN) 100 MG capsule TAKE 1 CAPSULE BY MOUTH AT BEDTIME Patient taking differently: Take 100 mg by mouth at bedtime.  03/20/18  Yes [name], MD  Insulin Glargine (LANTUS SOLOSTAR) 100 UNIT/ML Solostar Pen INJECT 20 UNITS EVERY NIGHT AT BEDTIME Patient taking differently: Inject 20 Units into the skin every morning. INJECT 20 UNITS EVERY NIGHT AT BEDTIME 04/22/18  Yes Lady Deutscher, MD  loperamide (IMODIUM) 2 MG capsule Take 2 mg by mouth as needed for diarrhea or loose stools.   Yes [provider]  meclizine (ANTIVERT) 12.5 MG tablet Take 1 tablet (12.5 mg total) by mouth 3 (three) times daily as needed for dizziness. 06/27/15  Yes [name], MD  metoprolol succinate (TOPROL-XL) 100 MG 24 hr tablet TAKE 1 TABLET BY MOUTH ONCE DAILY Patient taking differently: Take 100 mg by mouth daily.  12/28/17  Yes [name], MD  Multiple Vitamin (MULTIVITAMIN WITH MINERALS) TABS tablet Take 1 tablet by mouth every morning.    Yes [provider]  NON FORMULARY Inject 1 Dose into the vein once a week. Platelet booster   Yes [provider]  OVER THE COUNTER MEDICATION Place 1 drop into both eyes daily as needed (dry eyes). Over the counter lubricating eye drop   Yes [provider]  tamsulosin (FLOMAX) 0.4 MG CAPS capsule TAKE 1 CAPSULE BY MOUTH ONCE DAILY Patient taking differently: Take 0.4 mg by mouth daily.  04/11/18  Yes [name], MD  traMADol (ULTRAM) 50 MG tablet Take 1 tablet (50 mg total) by mouth every 6 (six) hours as needed for moderate pain. 01/03/18  Yes [name], MD  doxycycline (VIBRAMYCIN) 100 MG capsule Take 1 capsule (100 mg total) by mouth 2 (two) times daily. Patient not taking: Reported on 05/01/2018 04/14/18   Virgel Manifold, MD    Physical Exam: Vitals:   05/01/18 1545 05/01/18 1600 05/01/18 1625 05/01/18 1635  BP: 137/60 (!) 146/69 (!) 144/68   Pulse: (!) 105 (!) 103 (!) 107   Resp: (!) 28 (!) 28 (!) 24   Temp:   100.2 F (37.9 C) (!) 102.5 F (39.2 C)  TempSrc:   Oral Rectal  SpO2: 91% 93% 92%   Weight:      Height:         General:  Appears calm and comfortable and is NAD Eyes:   EOMI, normal lids, iris ENT:  Hard of hearing, normal lips & tongue Neck:  no LAD, masses or thyromegaly; no carotid bruits Cardiovascular:  RRR, no m/r/g. Chronic 1-2+ LE edema.  Respiratory:   CTA bilaterally with no wheezes/rales/rhonchi.  Normal respiratory effort. Abdomen:  soft, NT, ND, NABS Skin:  no rash or induration seen on limited exam Musculoskeletal:  grossly normal tone BUE/BLE, good ROM, no bony abnormality Psychiatric:  grossly normal mood and affect, speech fluent and appropriate, AOx3 Neurologic:  CN 2-12 grossly intact, moves all extremities in coordinated fashion, sensation intact    Radiological Exams on Admission: Dg Chest 2 View  Result Date: 05/01/2018 CLINICAL DATA:  Acute onset of central chest pain and cough. EXAM: CHEST - 2 VIEW COMPARISON:  Chest radiograph performed 04/20/2018 FINDINGS: The lungs are well-aerated. Medial right basilar airspace opacity may reflect pneumonia or asymmetric pulmonary edema. Mild vascular congestion is noted. A small left pleural effusion is suspected. There is  no evidence of pneumothorax. The heart is normal in size; the mediastinal contour is within normal limits. No acute osseous abnormalities are seen. IMPRESSION: Medial right basilar airspace opacity may reflect pneumonia or asymmetric pulmonary edema. Mild vascular congestion noted. Suspect small left pleural effusion. Electronically Signed   By: Garald Balding M.D.   On: 05/01/2018 06:36    EKG: Independently reviewed.  Sinus tachycardia with rate 105; LBBB, similar to prior   Labs on Admission: I have personally reviewed the available labs and imaging studies at the time of the admission.  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)   Type 2 diabetes mellitus (HCC)   Anemia in chronic kidney disease   Dyslipidemia   CKD (chronic kidney disease), stage IV (HCC)   Chronic neutropenia (HCC)   Chronic systolic CHF (congestive heart failure) (HCC)   Fever   Unstable angina -Patient hospitalized last week with chest pain, plan for dobutamine stress test was considered -Also with moderate to severe AS with low flow noted -He has done well since going home until this AM, when he awoke about 0400 with substernal chest pain that came on acutely at rest and improved with pain medication; it recurred and again resolved with pain medication. -1/3 typical symptoms suggestive of atypical vs. noncardiac chest pain.  -CXR unremarkable.   -Initial cardiac enzymes negative.   -EKG appears unchanged. -cycle troponin q6h x 3 and repeat EKG in AM -Start ASA 81 mg PO daily -morphine given -Start Pravachol as per cardiology -Start Imdur  Chronic combined CHF -Will continue home Lasix, as he does appear to be at/near his usual baseline  HTN -Continue Toprol  XL  HLD -Start Pravachol  DM -Last A1c was 5.2 -Continue Lantus -Will cover with moderate-scale SSI for now  Fever -Patient did not report apparent infectious symptoms at the time of admission, and then was nothing remarkable on exam -However, upon arrival on 5C, the nurse thought he felt warm and checked an oral temp which was 100.2 -She then decided to check a rectal temp and it was 102.1 -He was recently diagnosed with strep viridans bacteremia and coag negative staph bacteremia and completed a round of doxycycline for 10 days -His CXR was not diagnostic but suggested a hint of an infiltrate -For now, will obtain blood cultures; treat fever with Tylenol; and monitor for ongoing evidence of fever -Will request lactate and procalcitonin and initiate sepsis protocol - given normotension and no definitive evidence of sepsis at this time, will hold bolus and antibiotics but start if needed based on lactate and clinical scenario.  Stage IV CKD with anemia -Appears to be stable at this time -Continue to monitor without intervention -Hgb goal is >8; received 1 unit of PRBC during last hospitalization  MDS with chronic neutropenia -Appears to have stable neutropenia -Will follow    DVT prophylaxis: Heparin drip Code Status:  Full - confirmed with patient/family Family Communication:  Wife and son were present throughout evaluation  Disposition Plan:  Home once clinically improved Consults called: Cardiology  Admission status: Admit - It is my clinical opinion that admission to INPATIENT is reasonable and necessary because of the expectation that this patient will require hospital care that crosses at least 2 midnights to treat this condition based on the medical complexity of the problems presented.  Given the aforementioned information, the predictability of an adverse outcome is felt to be significant.    Karmen Bongo MD [facility]  If note is complete, please contact  covering daytime or nighttime physician. [email or web address] Password [email or web address]  05/01/2018, 5:13 PM

## 2018-05-01 NOTE — Consult Note (Addendum)
Cardiology Consultation:   Patient ID: NICHOLOUS GIRGENTI MRN: [id number]; DOB: Feb 02, 1931  Admit date: 05/01/2018 Date of Consult: 05/01/2018  Primary Care Provider: [name], MD Primary Cardiologist: [name], MD  Primary Electrophysiologist:  None    Patient Profile:   Ricardo Watkins is a 84 y.o. male with a hx of CAD, PTCA of RI 2004, non Hodgkin's lymphoma, Myelodysplastic syndrome/chronic ITP/chronic neutropenia with Hx of pleur-X cath secondary to non malignant pl effusions, IDDM, hld, cartid artery disease, stable LICA stenosis, HTN and AS who is being seen today for the evaluation of chest pain at 0400 today that increased, a pressure 5/10 pain,  at the request of Dr. Betsey Holiday.  History of Present Illness:   Ricardo Watkins with above hx and recent admit 04/20/18 with chest pain.  Also admitted 02/21/18 with CHF.  Last echo with EF 30-35%, mildly dilated LV, G1 DD, moderate to severe stensosis of AV, moderate TR and mod MR, LA was moderately dilated.  This was a new drop in EF from 01/2018.  During most recent hospitalization was for chest pain, HF, he has CKD- IV, Dr. Oval Linsey reviewed echo from Sept and felt the aortic stenosis is moderate.  She did not believe he would be a surgical candidate but may be a TAVR candidate.  At some point she felt he may need Dobutamine stress echo.  His troponins were neg.  He was diuresed to neg 1950.  He has appt with Dr. [name] in Dec.  He did receive a unit of blood that admit. With HGB of 7.9.    Last saw oncology for OV and dx of MDS (myelodysplastic syndrome) maybe chemo related has monthly B 12 injections. Chronic neutropenia due to underlying bone marrow disease  Last darbepoetin given on 04/27/18.   Today pt presents with chest pain that began at 0400. He had been feeling well until that time.  Chest pressure mid sternal, mild SOB but not much.  Here in ER rec'd IV morphine with relief and now returning.   EKG ST with LBBB, deeper T wave  inversions in lateral leads could be from LBBB.   Troponin 0.00 and 0.06.  BNP 525, Na 139, K+ 4.0 Cr 1.87, CA+ 8.8,  Hgb 8.4 Hct 28.7  plts unable to estimate,  WBC 3.2   CXR 2 V Medial right basilar airspace opacity may reflect pneumonia or asymmetric pulmonary edema. Mild vascular congestion noted. Suspect small left pleural effusion.  Now resting in stretcher pain had resolved with morphine but now returning.  He feels very warm.   He is weaker today than on last admit.  His volume has been fairly well controlled per pt.     Past Medical History:  Diagnosis Date  . Adenomatous colon polyp   . CAD (coronary artery disease)    30% LAD Stenosis, 70% ramus intermedius stenosis, treated with PTCA and angioplasty by Dr Albertine Patricia 2004  . Cataract    right eye  . CHF (congestive heart failure) ([facility])   . Chronic ITP (idiopathic thrombocytopenia) ([facility])   . Cough, persistent 11/04/2015  . Deficiency anemia 04/19/2014  . Diverticulosis   . DM (diabetes mellitus) ([facility])   . DVT (deep venous thrombosis) ([facility])    secondary to surgery  . Dyslipidemia   . Dyspnea   . Hyperlipidemia   . Hypertension   . Inguinal hernia    right  . 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 Microcytic anemia 07/07/2015  .  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 Non Hodgkin's lymphoma ([facility])   . Pleural effusion, left 11/04/2015  . Prostate CA ([facility]) 09/10/2011   Gleason 3+3 R, 3+4 L lobe May 2007 Rx Radioactive seed implants Dr [name]  . PVD (peripheral vascular disease) ([facility])    rt renal artery stent  . Renal insufficiency   . Thrombocytopenia ([facility])   . Thrombotic stroke ([facility]) 09/10/2011   January 18, 2011 infarct genu & post limb R internal capsule - acute; previous lacunar infarcts/extensive white matter dis  . Thyroid nodule    left lower lobe (annual monitoring).  . Vitamin D deficiency     Past Surgical History:  Procedure Laterality Date  . APPENDECTOMY     patient  ?  Marland Kitchen CARDIAC CATHETERIZATION    . CHEST TUBE INSERTION Left 02/10/2016   Procedure: INSERTION PLEURAL DRAINAGE CATHETER;  Surgeon: [name], MD;  Location: [facility];  Service: Thoracic;  Laterality: Left;  . CHEST TUBE INSERTION Left 04/08/2016   Procedure: CHEST TUBE INSERTION;  Surgeon: [name], MD;  Location: [facility];  Service: Thoracic;  Laterality: Left;  . COLONOSCOPY    . CORONARY ANGIOPLASTY    . CYSTOURETHROSCOPY     ROBOTIC ARM NUCLETRON SEED IMPLANTATION OF PROSTATE  . EXPLORATORY LAPAROTOMY     For evaluation of lymphoma  . REMOVAL OF PLEURAL DRAINAGE CATHETER Left 04/08/2016   Procedure: REMOVAL OF PLEURAL DRAINAGE CATHETER;  Surgeon: [name], MD;  Location: [facility];  Service: Thoracic;  Laterality: Left;     Home Medications:  Prior to Admission medications   Medication Sig Start Date End Date Taking? Authorizing Provider  acetaminophen (TYLENOL) 500 MG tablet Take 1,000 mg by mouth every 6 (six) hours as needed for mild pain, moderate pain or headache.    Yes [provider]  clopidogrel (PLAVIX) 75 MG tablet TAKE 1 TABLET BY MOUTH ONCE A DAY WITH BREAKFAST Patient taking differently: Take 75 mg by mouth daily.  04/10/18  Yes [name], MD  furosemide (LASIX) 40 MG tablet Take 60mg  daily by mouth. (3 tablets) Except excess swelling or weight gain take an additional 40mg  daily (2 tablets) Patient taking differently: Take 40 mg by mouth daily.  04/22/18  Yes Lady Deutscher, MD  gabapentin (NEURONTIN) 100 MG capsule TAKE 1 CAPSULE BY MOUTH AT BEDTIME Patient taking differently: Take 100 mg by mouth at bedtime.  03/20/18  Yes [name], MD  Insulin Glargine (LANTUS SOLOSTAR) 100 UNIT/ML Solostar Pen INJECT 20 UNITS EVERY NIGHT AT BEDTIME Patient taking differently: Inject 20 Units into the skin every morning. INJECT 20 UNITS EVERY NIGHT AT BEDTIME 04/22/18  Yes Lady Deutscher, MD  loperamide (IMODIUM) 2 MG capsule Take 2 mg by mouth  as needed for diarrhea or loose stools.   Yes [provider]  meclizine (ANTIVERT) 12.5 MG tablet Take 1 tablet (12.5 mg total) by mouth 3 (three) times daily as needed for dizziness. 06/27/15  Yes [name], MD  metoprolol succinate (TOPROL-XL) 100 MG 24 hr tablet TAKE 1 TABLET BY MOUTH ONCE DAILY Patient taking differently: Take 100 mg by mouth daily.  12/28/17  Yes [name], MD  Multiple Vitamin (MULTIVITAMIN WITH MINERALS) TABS tablet Take 1 tablet by mouth every morning.    Yes [provider]  NON FORMULARY Inject 1 Dose into the vein once a week. Platelet booster   Yes [provider]  OVER THE COUNTER MEDICATION Place 1 drop into both eyes daily as needed (dry eyes). Over the counter lubricating eye drop   Yes [provider]  tamsulosin (FLOMAX) 0.4 MG CAPS capsule TAKE 1 CAPSULE BY MOUTH ONCE DAILY Patient taking differently: Take 0.4 mg by mouth daily.  04/11/18  Yes [name], MD  traMADol (ULTRAM) 50 MG tablet Take 1 tablet (50 mg total) by mouth every 6 (six) hours as needed for moderate pain. 01/03/18  Yes [name], MD  doxycycline (VIBRAMYCIN) 100 MG capsule Take 1 capsule (100 mg total) by mouth 2 (two) times daily. Patient not taking: Reported on 05/01/2018 04/14/18   Virgel Manifold, MD    Inpatient Medications: Scheduled Meds: . cyanocobalamin  1,000 mcg Subcutaneous Q30 days   Continuous Infusions:  PRN Meds:   Allergies:    Allergies  Allergen Reactions  . Glucophage [Metformin Hydrochloride] Other (See Comments)    Chest pain  . Zetia [Ezetimibe] Other (See Comments)    weakness  . Fenofibrate Rash  . Niacin-Lovastatin Er Rash    Social History:   Social History   Socioeconomic History  . Marital status: Married    Spouse name: Not on file  . Number of children: 4  . Years of education: Not on file  . Highest education level: Not on file  Occupational History  . Occupation: retired    Scientific laboratory technician  . Financial resource strain: Not on file  . Food insecurity:    Worry: Not on file    Inability: Not on file  . Transportation needs:    Medical: Not on file    Non-medical: Not on file  Tobacco Use  . Smoking status: Former Smoker    Packs/day: 0.50    Years: 20.00    Pack years: 10.00    Types: Pipe, Cigarettes    Last attempt to quit: 06/21/1958    Years since quitting: 59.9  . Smokeless tobacco: Never Used  Substance and Sexual Activity  . Alcohol use: No    Alcohol/week: 0.0 standard drinks  . Drug use: No  . Sexual activity: Yes    Partners: Female  Lifestyle  . Physical activity:    Days per week: Not on file    Minutes per session: Not on file  . Stress: Not on file  Relationships  . Social connections:    Talks on phone: Not on file    Gets together: Not on file    Attends religious service: Not on file    Active member of club or organization: Not on file    Attends meetings of clubs or organizations: Not on file    Relationship status: Not on file  . Intimate partner violence:    Fear of current or ex partner: Not on file    Emotionally abused: Not on file    Physically abused: Not on file    Forced sexual activity: Not on file  Other Topics Concern  . Not on file  Social History Narrative  . Not on file    Family History:    Family History  Problem Relation Age of Onset  . Lymphoma Sister   . Prostate cancer Brother   . Breast cancer Sister   . Diabetes Brother   . Diabetes Sister   . Heart disease Brother   . Asthma Son      ROS:  Please see the history of present illness.  General:no colds or fevers, no weight changes--  his wt has been stable since discharge Skin:no rashes or ulcers HEENT:no blurred vision, no congestion CV:see HPI PUL:see HPI GI:no diarrhea constipation or melena, no indigestion GU:no hematuria, no dysuria MS:no joint pain, no claudication Neuro:no syncope, no lightheadedness Endo:+ diabetes, no  thyroid disease  All other ROS reviewed and negative.     Physical Exam/Data:   Vitals:   05/01/18 1130 05/01/18 1145 05/01/18 1200 05/01/18 1215  BP: (!) 153/67 (!) 154/68 (!) 155/64 (!) 150/66  Pulse:   (!) 106 (!) 104  Resp: (!) 23 (!) 23 (!) 24 (!) 22  Temp:      TempSrc:      SpO2:  93% 95% 94%  Weight:      Height:        Intake/Output Summary (Last 24 hours) at 05/01/2018 1327 Last data filed at 05/01/2018 0848 Gross per 24 hour  Intake -  Output 600 ml  Net -600 ml   Filed Weights   05/01/18 0556  Weight: 82.1 kg   Body mass index is 26.73 kg/m.  General:  Well nourished, well developed, in no acute distress though is weaker than last visit.  HEENT: normal Lymph: no adenopathy Neck: no JVD Endocrine:  No thryomegaly Vascular: No carotid bruits; pedal pulses 2+ bilaterally  Cardiac:  normal S1, S2; RRR; 3/6 systolic murmur no gallup rub or click Lungs:  clear to auscultation bilaterally, no wheezing, rhonchi or rales  Abd: soft, nontender, no hepatomegaly  Ext: no to tr edema Musculoskeletal:  No deformities, BUE and BLE strength normal and equal Skin: warm and dry  Neuro: alert and oriented X 3 MAE, follows commands, no focal abnormalities noted Psych:  Normal affect    Relevant CV Studies: Echo 02/20/18 Study Conclusions  - Left ventricle: The cavity size was mildly dilated. Systolic   function was moderately to severely reduced. The estimated   ejection fraction was in the range of 30% to 35%. There is   moderate hypokinesis of the mid-apicalapical myocardium. There is   moderate hypokinesis of the inferior myocardium. Doppler   parameters are consistent with abnormal left ventricular   relaxation (grade 1 diastolic dysfunction). - Aortic valve: Left coronary cusp mobility was moderately   restricted. Transvalvular velocity was increased, due to   stenosis. There was moderate to severe stenosis accounting for   poor systolic function. Valve area  (VTI): 2 cm^2. Valve area   (Vmax): 2.12 cm^2. Valve area (Vmean): 1.96 cm^2. - Mitral valve: There was moderate regurgitation. - Left atrium: The atrium was moderately dilated. - Right atrium: The atrium was moderately dilated. - Tricuspid valve: There was moderate regurgitation. - Pulmonary arteries: Systolic pressure was mildly increased.  Echo 01/2018 Study Conclusions  - Left ventricle: The cavity size was mildly dilated. Wall   thickness was normal. Systolic function was mildly reduced. The   estimated ejection fraction was in the range of 45% to 50%.   Diffuse hypokinesis. Doppler parameters are consistent with   abnormal left ventricular relaxation (grade 1 diastolic   dysfunction). - Aortic valve: Trileaflet; moderately thickened, severely   calcified leaflets. Valve mobility was restricted. There was mild   stenosis. Peak velocity (S): 281 cm/s. Mean gradient (S): 18 mm   Hg. Peak gradient (S): 32 mm Hg. - Mitral valve: Calcified annulus. There was mild regurgitation. - Left atrium: The atrium was mildly dilated. - Right ventricle: The cavity size was mildly dilated. Wall   thickness was normal. - Pericardium, extracardiac: A small pericardial  effusion was   identified posterior to the heart.   Cardiac cath 2004 FINDINGS:  1. Left ventricle 174/7/24. Ejection fraction 75% without regional wall     motion abnormality.  2. No mitral regurgitation or aortic stenosis.  3. Left main:  Angiographically normal.  4. Left anterior descending artery:  A large vessel giving rise to  a single     large diagonal branch. There is a 30% stenosis of the proximal vessel.  5. Ramus intermedius:  A moderate sized vessel. It is approximately 2.25 mm     in diameter. There is a 70% ostial stenosis.  6. Circumflex:  A large, dominant vessel giving rise to 2 obtuse marginals     in the PDA. It is angiographically normal.  7. Right coronary artery:  A small, nondominant vessel. It is      angiographically normal.  8. Minimally diseased abdominal  aorta. There are single renal arteries     bilaterally. The left renal artery is normal. The right renal artery has     a 90% stenosis occurring at the site of a very early bifurcation. The     lesion involves both branches.   IMPRESSION/RECOMMENDATIONS:  1. Single vessel coronary artery disease involving the ostium of a small     ramus intermedius branch. As the patient is asymptomatic, we will     continue preventive efforts. Continue beta blocker.  2. Normal left ventricular size and systolic function.  3. Severe unilateral renal artery stenosis in the setting of poorly     controlled hypertension despite 4 medications.   Suggest percutaneous revascularization with goals of control of hypertension  and possible preservation of renal function.   Laboratory Data:  Chemistry Recent Labs  Lab 05/01/18 0610  NA 139  K 4.0  CL 109  CO2 23  GLUCOSE 166*  BUN 36*  CREATININE 1.87*  CALCIUM 8.8*  GFRNONAA 31*  GFRAA 36*  ANIONGAP 7    No results for input(s): PROT, ALBUMIN, AST, ALT, ALKPHOS, BILITOT in the last 168 hours. Hematology Recent Labs  Lab 04/27/18 1417 05/01/18 0610  WBC 2.8* 3.2*  RBC 3.34* 3.43*  HGB 8.3* 8.4*  HCT 28.0* 28.7*  MCV 83.8 83.7  MCH 24.9* 24.5*  MCHC 29.6* 29.3*  RDW 25.7* 25.9*  PLT 115* PLATELET CLUMPS NOTED ON SMEAR, UNABLE TO ESTIMATE   Cardiac EnzymesNo results for input(s): TROPONINI in the last 168 hours.  Recent Labs  Lab 05/01/18 0608 05/01/18 1056  TROPIPOC 0.00 0.06    BNP Recent Labs  Lab 05/01/18 0610  BNP 525.5*    DDimer No results for input(s): DDIMER in the last 168 hours.  Radiology/Studies:  Dg Chest 2 View  Result Date: 05/01/2018 CLINICAL DATA:  Acute onset of central chest pain and cough. EXAM: CHEST - 2 VIEW COMPARISON:  Chest radiograph performed 04/20/2018 FINDINGS: The lungs are well-aerated. Medial right basilar airspace opacity may  reflect pneumonia or asymmetric pulmonary edema. Mild vascular congestion is noted. A small left pleural effusion is suspected. There is no evidence of pneumothorax. The heart is normal in size; the mediastinal contour is within normal limits. No acute osseous abnormalities are seen. IMPRESSION: Medial right basilar airspace opacity may reflect pneumonia or asymmetric pulmonary edema. Mild vascular congestion noted. Suspect small left pleural effusion. Electronically Signed   By: Garald Balding M.D.   On: 05/01/2018 06:36    Assessment and Plan:   1. Chest pain with neg troponin poc - EKG with  lat ST depression LBBB, pain resolved with morphine and now returning.  [name] to see.  Would ask [facility] to admit and we will follow for chest pain.   2. Acute/Chronic systolic and diastolic HF. Last admit diuresed and he felt better. He feels that has been controlled since discharge.  3. Cardiomyopathy from August until Sept drop in EF to 30-35% from 45-50%.   4. AS most likely moderate, not a candidate for surgery but may be for TAVR but does have multiple co morbidities.  5. MDS/ITP/pancytopenia, last transfusion 04/20/18  6. CKD IV stable  7. CAD on cath 2004.  8. PAD with renal artery stent on plavix.         For questions or updates, please contact [facility] Please consult [email or web address] for contact info under     Signed, Cecilie Kicks, NP  05/01/2018 1:27 PM

## 2018-05-01 NOTE — Progress Notes (Signed)
[facility] NP/PA cancelled recurring Lactic acid order.  Will order a repeat in am (11/12)

## 2018-05-01 NOTE — ED Notes (Signed)
Cards at bedside

## 2018-05-01 NOTE — Progress Notes (Signed)
Per Dr. Lorin [facility], Lactic acid level is needed to determine if pt needs abx therapy.  Will notify her when lactic acid is resulted.

## 2018-05-01 NOTE — ED Triage Notes (Signed)
The patient said he started having chest pain at 0400 and it got progressively worse.  Patient took a pain pill at 0400 and took tylenol 2100.  The patient described the pain as pressure and rates 5/10.  Patient denies any other symptoms other than a dry cough.  He also said it stays central chest with no radiation.

## 2018-05-01 NOTE — ED Provider Notes (Signed)
Patient presented to the ER with chest pain.  Patient reports the pain began around 4 AM.  Pain is in the center of his chest, does not radiate.  Face to face Exam: HEENT - PERRLA Lungs - CTAB Heart - RRR, no M/R/G Abd - S/NT/ND Neuro - alert, oriented x3 Musculoskeletal -1-2+ pitting edema bilateral lower extremities  Plan: Patient hospitalized 1-1/2 weeks ago with similar presentation.  Reviewing records reveals that at that time they were considering dobutamine stress echo, but it was not performed because of multiple other medical problems present at that time.  Patient with recurrent similar symptoms, will obtain basic cardiac labs and then consult cardiology for further management.   Orpah Greek, MD 05/01/18 [phone]

## 2018-05-02 ENCOUNTER — Observation Stay (HOSPITAL_COMMUNITY): Payer: Medicare Other

## 2018-05-02 ENCOUNTER — Other Ambulatory Visit: Payer: Self-pay

## 2018-05-02 ENCOUNTER — Inpatient Hospital Stay: Payer: Medicare Other | Admitting: Family Medicine

## 2018-05-02 DIAGNOSIS — S6991XA Unspecified injury of right wrist, hand and finger(s), initial encounter: Secondary | ICD-10-CM | POA: Diagnosis not present

## 2018-05-02 DIAGNOSIS — I200 Unstable angina: Secondary | ICD-10-CM | POA: Diagnosis not present

## 2018-05-02 DIAGNOSIS — L03011 Cellulitis of right finger: Secondary | ICD-10-CM | POA: Diagnosis not present

## 2018-05-02 DIAGNOSIS — D46C Myelodysplastic syndrome with isolated del(5q) chromosomal abnormality: Secondary | ICD-10-CM | POA: Diagnosis not present

## 2018-05-02 DIAGNOSIS — E1122 Type 2 diabetes mellitus with diabetic chronic kidney disease: Secondary | ICD-10-CM | POA: Diagnosis not present

## 2018-05-02 DIAGNOSIS — R509 Fever, unspecified: Secondary | ICD-10-CM | POA: Diagnosis not present

## 2018-05-02 DIAGNOSIS — N184 Chronic kidney disease, stage 4 (severe): Secondary | ICD-10-CM | POA: Diagnosis not present

## 2018-05-02 DIAGNOSIS — N183 Chronic kidney disease, stage 3 (moderate): Secondary | ICD-10-CM

## 2018-05-02 DIAGNOSIS — I25110 Atherosclerotic heart disease of native coronary artery with unstable angina pectoris: Secondary | ICD-10-CM | POA: Diagnosis not present

## 2018-05-02 DIAGNOSIS — M7989 Other specified soft tissue disorders: Secondary | ICD-10-CM | POA: Diagnosis not present

## 2018-05-02 DIAGNOSIS — I5022 Chronic systolic (congestive) heart failure: Secondary | ICD-10-CM | POA: Diagnosis not present

## 2018-05-02 DIAGNOSIS — M79644 Pain in right finger(s): Secondary | ICD-10-CM | POA: Diagnosis not present

## 2018-05-02 DIAGNOSIS — D631 Anemia in chronic kidney disease: Secondary | ICD-10-CM | POA: Diagnosis not present

## 2018-05-02 DIAGNOSIS — I5042 Chronic combined systolic (congestive) and diastolic (congestive) heart failure: Secondary | ICD-10-CM | POA: Diagnosis not present

## 2018-05-02 DIAGNOSIS — I130 Hypertensive heart and chronic kidney disease with heart failure and stage 1 through stage 4 chronic kidney disease, or unspecified chronic kidney disease: Secondary | ICD-10-CM | POA: Diagnosis not present

## 2018-05-02 DIAGNOSIS — E1151 Type 2 diabetes mellitus with diabetic peripheral angiopathy without gangrene: Secondary | ICD-10-CM | POA: Diagnosis not present

## 2018-05-02 DIAGNOSIS — Z66 Do not resuscitate: Secondary | ICD-10-CM | POA: Diagnosis not present

## 2018-05-02 DIAGNOSIS — E785 Hyperlipidemia, unspecified: Secondary | ICD-10-CM | POA: Diagnosis not present

## 2018-05-02 DIAGNOSIS — D61818 Other pancytopenia: Secondary | ICD-10-CM | POA: Diagnosis not present

## 2018-05-02 LAB — GLUCOSE, CAPILLARY
GLUCOSE-CAPILLARY: 139 mg/dL — AB (ref 70–99)
GLUCOSE-CAPILLARY: 142 mg/dL — AB (ref 70–99)
GLUCOSE-CAPILLARY: 166 mg/dL — AB (ref 70–99)
Glucose-Capillary: 136 mg/dL — ABNORMAL HIGH (ref 70–99)
Glucose-Capillary: 169 mg/dL — ABNORMAL HIGH (ref 70–99)

## 2018-05-02 LAB — LACTIC ACID, PLASMA: Lactic Acid, Venous: 1.2 mmol/L (ref 0.5–1.9)

## 2018-05-02 LAB — TROPONIN I
Troponin I: 0.11 ng/mL (ref <0.03)
Troponin I: 0.1 ng/mL (ref <0.03)

## 2018-05-02 MED ORDER — SODIUM CHLORIDE 0.9 % IV SOLN
2.0000 g | INTRAVENOUS | Status: DC
  Administered 2018-05-02 – 2018-05-03 (×2): 2 g via INTRAVENOUS
  Filled 2018-05-02 (×2): qty 20

## 2018-05-02 MED ORDER — ISOSORBIDE MONONITRATE ER 60 MG PO TB24
60.0000 mg | ORAL_TABLET | Freq: Every day | ORAL | Status: DC
  Administered 2018-05-03: 60 mg via ORAL
  Filled 2018-05-02: qty 1

## 2018-05-02 NOTE — Progress Notes (Signed)
Progress Note  Patient Name: Ricardo Watkins Date of Encounter: 05/02/2018  Primary Cardiologist: [name], MD   Subjective   Feels great today. Chest pain resolved. No dyspnea.  Inpatient Medications    Scheduled Meds: . aspirin EC  81 mg Oral Daily  . clopidogrel  75 mg Oral Daily  . enoxaparin (LOVENOX) injection  30 mg Subcutaneous Q24H  . furosemide  40 mg Oral Daily  . gabapentin  100 mg Oral QHS  . insulin aspart  0-15 Units Subcutaneous TID WC  . insulin aspart  0-5 Units Subcutaneous QHS  . insulin glargine  20 Units Subcutaneous BH-q7a  . isosorbide mononitrate  30 mg Oral Daily  . metoprolol succinate  100 mg Oral Daily  . pravastatin  40 mg Oral q1800  . tamsulosin  0.4 mg Oral Daily   Continuous Infusions:  PRN Meds: acetaminophen, morphine injection, ondansetron (ZOFRAN) IV, traMADol   Vital Signs    Vitals:   05/01/18 2214 05/02/18 0105 05/02/18 0302 05/02/18 0621  BP: 138/69 (!) 134/59 (!) 139/57 (!) 127/57  Pulse: 93 86 91 86  Resp: 18 18 18 18   Temp: 98.5 F (36.9 C) 98.6 F (37 C) 98.6 F (37 C) 99 F (37.2 C)  TempSrc: Oral Oral Oral Oral  SpO2: 96% 97% 94% 94%  Weight:      Height:       No intake or output data in the 24 hours ending 05/02/18 1201 Filed Weights   05/01/18 0556  Weight: 82.1 kg    Telemetry    NSR - Personally Reviewed  ECG    NSR, LAD, septal infarct. Compared to yesterday there is less ST depression laterally.  - Personally Reviewed  Physical Exam   GEN: No acute distress.  Elderly and frail. Neck: No JVD Cardiac: RRR, gr 3/6 systolic murmur RUSB, no  rubs, or gallops.  Respiratory: Clear to auscultation bilaterally. GI: Soft, nontender, non-distended  MS: No edema; No deformity. Neuro:  Nonfocal  Psych: Normal affect   Labs    Chemistry Recent Labs  Lab 05/01/18 0610  NA 139  K 4.0  CL 109  CO2 23  GLUCOSE 166*  BUN 36*  CREATININE 1.87*  CALCIUM 8.8*  GFRNONAA 31*  GFRAA 36*    ANIONGAP 7     Hematology Recent Labs  Lab 04/27/18 1417 05/01/18 0610  WBC 2.8* 3.2*  RBC 3.34* 3.43*  HGB 8.3* 8.4*  HCT 28.0* 28.7*  MCV 83.8 83.7  MCH 24.9* 24.5*  MCHC 29.6* 29.3*  RDW 25.7* 25.9*  PLT 115* PLATELET CLUMPS NOTED ON SMEAR, UNABLE TO ESTIMATE    Cardiac Enzymes Recent Labs  Lab 05/01/18 1656 05/02/18 0145 05/02/18 0518  TROPONINI 0.08* 0.11* 0.10*    Recent Labs  Lab 05/01/18 0608 05/01/18 1056  TROPIPOC 0.00 0.06     BNP Recent Labs  Lab 05/01/18 0610  BNP 525.5*     DDimer No results for input(s): DDIMER in the last 168 hours.   Radiology    Dg Chest 2 View  Result Date: 05/01/2018 CLINICAL DATA:  Acute onset of central chest pain and cough. EXAM: CHEST - 2 VIEW COMPARISON:  Chest radiograph performed 04/20/2018 FINDINGS: The lungs are well-aerated. Medial right basilar airspace opacity may reflect pneumonia or asymmetric pulmonary edema. Mild vascular congestion is noted. A small left pleural effusion is suspected. There is no evidence of pneumothorax. The heart is normal in size; the mediastinal contour is within normal limits. No  acute osseous abnormalities are seen. IMPRESSION: Medial right basilar airspace opacity may reflect pneumonia or asymmetric pulmonary edema. Mild vascular congestion noted. Suspect small left pleural effusion. Electronically Signed   By: Garald Balding M.D.   On: 05/01/2018 06:36   Dg Chest Port 1 View  Result Date: 05/01/2018 CLINICAL DATA:  Sepsis EXAM: PORTABLE CHEST 1 VIEW COMPARISON:  May 01, 2018 chest radiograph obtained earlier in the day FINDINGS: Currently there Is a minimal left pleural effusion. No edema or consolidation. Heart is upper normal in size with pulmonary vascularity normal. There is aortic atherosclerosis. No adenopathy. There is degenerative change in the thoracic spine. IMPRESSION: Minimal left pleural effusion. No edema or consolidation. Stable cardiac silhouette. There is aortic  atherosclerosis. Aortic Atherosclerosis (ICD10-I70.0). Electronically Signed   By: Lowella Grip III M.D.   On: 05/01/2018 18:08    Cardiac Studies   Echo 02/20/18 Study Conclusions  - Left ventricle: The cavity size was mildly dilated. Systolic function was moderately to severely reduced. The estimated ejection fraction was in the range of 30% to 35%. There is moderate hypokinesis of the mid-apicalapical myocardium. There is moderate hypokinesis of the inferior myocardium. Doppler parameters are consistent with abnormal left ventricular relaxation (grade 1 diastolic dysfunction). - Aortic valve: Left coronary cusp mobility was moderately restricted. Transvalvular velocity was increased, due to stenosis. There was moderate to severe stenosis accounting for poor systolic function. Valve area (VTI): 2 cm^2. Valve area (Vmax): 2.12 cm^2. Valve area (Vmean): 1.96 cm^2. - Mitral valve: There was moderate regurgitation. - Left atrium: The atrium was moderately dilated. - Right atrium: The atrium was moderately dilated. - Tricuspid valve: There was moderate regurgitation. - Pulmonary arteries: Systolic pressure was mildly increased.   Patient Profile     84 y.o. male with AS, CAD, CKD, myelodysplasia and pancytopenia, IDDM and recurrent pleural effusions. Multiple recent hospital admissions for CHF, infection, anemia. Presents  for evaluation of chest pain.  Assessment & Plan    1. Chest pain with mildly elevated troponin  - EKG with lat ST depression LBBB. Pain resolved. Etiology includes possible ACS versus demand ischemia. Troponin levels flat. Pain much improved with addition of nitrates. Will increase Imdur to 60 mg daily. Will need Rx for Sl Ntg at home. Given multiple co morbidities he is a poor candidate for invasive procedures. Will manage conservatively. 2. Acute/Chronic systolic and diastolic HF. Last admit diuresed and he felt better. Volume status  appears stable on current diuretics.  3. Cardiomyopathy from August until Sept drop in EF to 30-35% from 45-50%.   4. AS most likely moderate, not a candidate for surgery at all and a very poor candidate for TAVR. 5. MDS/ITP/pancytopenia, last transfusion 04/20/18  6. CKD IV stable  7. CAD on cath 2004.  8. PAD with renal artery stent on plavix.         For questions or updates, please contact [place] Please consult [email or web address] for contact info under        Signed, Susie Pousson Martinique, MD  05/02/2018, 12:01 PM

## 2018-05-02 NOTE — Progress Notes (Signed)
Paged Bodenheimer PA-C to be aware of critical troponin 0.11. Pt denies chest pain. Sinus rhythm to cardiac monitor. Pt resting, no acute distress noted.

## 2018-05-02 NOTE — Progress Notes (Addendum)
Progress Note    Ricardo Watkins  VVO:[id number] DOB: 10/09/1930  DOA: 05/01/2018 PCP: [name], MD    Brief Narrative:     Medical records reviewed and are as summarized below:  Ricardo Watkins is an 84 y.o. male with medical history significant of PVD s/p R renal artery stent; remote prostate CA s/p seed radiation; NHL; HTN; HLD; DM; chronic ITP; CHF; and CAD s/p stent presenting with chest pain.   He was hospitalized last week for chest pain.  He was doing pretty well.  He awoke this AM about 0400 with tightness in his chest.  He was asleep when it started.   The pain lasted about an hour.  It improved after he took a pain pill, Tramadol.  It recurred while in the ER and lasted about 3 hours.  He was given more pain medication and it resolved.  He never received NTG.   Last stress test was 2012.  No h/o CAD requiring stent.  "He's not a candidate for heart cath - his heart's too weak and he's a bleeder, according to [name]."  Assessment/Plan:   Principal Problem:   Unstable angina ([place]) Active Problems:   Essential hypertension   MDS (myelodysplastic syndrome) with 5q deletion ([place])   Type 2 diabetes mellitus ([place])   Anemia in chronic kidney disease   Dyslipidemia   CKD (chronic kidney disease), stage IV ([facility])   Chronic neutropenia ([facility])   Chronic systolic CHF (congestive heart failure) ([facility])   Fever  Unstable angina -Patient hospitalized last week with chest pain, plan for dobutamine stress test was considered -Also with moderate to severe AS with low flow noted -He has done well since going home until this AM, when he awoke about 0400 with substernal chest pain that came on acutely at rest and improved with pain medication; it recurred and again resolved with pain medication. -imdur started yesterday at 30 mg (patient never got) and increased to 60 mg for tommorrow after AM dose  Chronic combined CHF -Will continue home Lasix, as he does appear to be at/near  his usual baseline  HTN -Continue Toprol XL  HLD -Start Pravachol  DM -Last A1c was 5.2 -Continue Lantus -Will cover with moderate-scale SSI for now  Fever -max: 102 -recently diagnosed with strep viridans bacteremia and coag negative staph bacteremia and completed a round of doxycycline for 10 days -repeat blood cultures negative thus far -his right index finger appears infected- swollen with broken skin overlying -start rocephin for cellultis  Stage IV CKD with anemia -Appears to be stable at this time -Continue to monitor without intervention -Hgb goal is >8; received 1 unit of PRBC during last hospitalization  MDS with chronic neutropenia -goes to cancer center every Wednesday-- message sent to Dr. Addison Naegeli RN to notify   Family Communication/Anticipated D/C date and plan/Code Status   DVT prophylaxis: Lovenox ordered. Code Status: DNR Family Communication: at bedside Disposition Plan: depending on clinical improvement: imdur increased, awaiting x ray of finger   Medical Consultants:    cards     Subjective:   Finger started swelling more yesterday  Objective:    Vitals:   05/02/18 0105 05/02/18 0302 05/02/18 0621 05/02/18 1206  BP: (!) 134/59 (!) 139/57 (!) 127/57 123/62  Pulse: 86 91 86 73  Resp: 18 18 18 17   Temp: 98.6 F (37 C) 98.6 F (37 C) 99 F (37.2 C) 98.1 F (36.7 C)  TempSrc: Oral Oral Oral Oral  SpO2: 97% 94% 94% 93%  Weight:      Height:       No intake or output data in the 24 hours ending 05/02/18 1330 Filed Weights   05/01/18 0556  Weight: 82.1 kg    Exam: In bed, NAD Right index finger with cut over proximal joint, edema below and above- can not bend the joint but can move the finger w/o pain No increased work of breathing Skin with few areas of bruising  Data Reviewed:   I have personally reviewed following labs and imaging studies:  Labs: Labs show the following:   Basic Metabolic Panel: Recent  Labs  Lab 05/01/18 0610  NA 139  K 4.0  CL 109  CO2 23  GLUCOSE 166*  BUN 36*  CREATININE 1.87*  CALCIUM 8.8*   GFR Estimated Creatinine Clearance: 28.9 mL/min (A) (by C-G formula based on SCr of 1.87 mg/dL (H)). Liver Function Tests: No results for input(s): AST, ALT, ALKPHOS, BILITOT, PROT, ALBUMIN in the last 168 hours. No results for input(s): LIPASE, AMYLASE in the last 168 hours. No results for input(s): AMMONIA in the last 168 hours. Coagulation profile No results for input(s): INR, PROTIME in the last 168 hours.  CBC: Recent Labs  Lab 04/27/18 1417 05/01/18 0610  WBC 2.8* 3.2*  NEUTROABS 0.7*  --   HGB 8.3* 8.4*  HCT 28.0* 28.7*  MCV 83.8 83.7  PLT 115* PLATELET CLUMPS NOTED ON SMEAR, UNABLE TO ESTIMATE   Cardiac Enzymes: Recent Labs  Lab 05/01/18 1656 05/02/18 0145 05/02/18 0518  TROPONINI 0.08* 0.11* 0.10*   BNP (last 3 results) No results for input(s): PROBNP in the last 8760 hours. CBG: Recent Labs  Lab 05/01/18 1716 05/01/18 2211 05/02/18 0613 05/02/18 0731 05/02/18 1157  GLUCAP 139* 134* 139* 136* 142*   D-Dimer: No results for input(s): DDIMER in the last 72 hours. Hgb A1c: Recent Labs    05/01/18 1656  HGBA1C 8.6*   Lipid Profile: No results for input(s): CHOL, HDL, LDLCALC, TRIG, CHOLHDL, LDLDIRECT in the last 72 hours. Thyroid function studies: No results for input(s): TSH, T4TOTAL, T3FREE, THYROIDAB in the last 72 hours.  Invalid input(s): FREET3 Anemia work up: No results for input(s): VITAMINB12, FOLATE, FERRITIN, TIBC, IRON, RETICCTPCT in the last 72 hours. Sepsis Labs: Recent Labs  Lab 04/27/18 1417 05/01/18 0610 05/01/18 1801 05/02/18 0518  PROCALCITON  --   --  0.11  --   WBC 2.8* 3.2*  --   --   LATICACIDVEN  --   --  0.9 1.2    Microbiology Recent Results (from the past 240 hour(s))  Culture, blood (routine x 2)     Status: None (Preliminary result)   Collection Time: 05/01/18  4:56 PM  Result Value Ref  Range Status   Specimen Description BLOOD LEFT ANTECUBITAL  Final   Special Requests   Final    BOTTLES DRAWN AEROBIC ONLY Blood Culture adequate volume   Culture   Final    NO GROWTH < 24 HOURS Performed at [facility], [address], [place], [place] [address]    Report Status PENDING  Incomplete  Culture, blood (routine x 2)     Status: None (Preliminary result)   Collection Time: 05/01/18  4:56 PM  Result Value Ref Range Status   Specimen Description BLOOD RIGHT ANTECUBITAL  Final   Special Requests   Final    BOTTLES DRAWN AEROBIC ONLY Blood Culture adequate volume   Culture  Final    NO GROWTH < 24 HOURS Performed at [facility], [place] [address], [place], [place] [address]    Report Status PENDING  Incomplete    Procedures and diagnostic studies:  Dg Chest 2 View  Result Date: 05/01/2018 CLINICAL DATA:  Acute onset of central chest pain and cough. EXAM: CHEST - 2 VIEW COMPARISON:  Chest radiograph performed 04/20/2018 FINDINGS: The lungs are well-aerated. Medial right basilar airspace opacity may reflect pneumonia or asymmetric pulmonary edema. Mild vascular congestion is noted. A small left pleural effusion is suspected. There is no evidence of pneumothorax. The heart is normal in size; the mediastinal contour is within normal limits. No acute osseous abnormalities are seen. IMPRESSION: Medial right basilar airspace opacity may reflect pneumonia or asymmetric pulmonary edema. Mild vascular congestion noted. Suspect small left pleural effusion. Electronically Signed   By: Garald Balding M.D.   On: 05/01/2018 06:36   Dg Chest Port 1 View  Result Date: 05/01/2018 CLINICAL DATA:  Sepsis EXAM: PORTABLE CHEST 1 VIEW COMPARISON:  May 01, 2018 chest radiograph obtained earlier in the day FINDINGS: Currently there Is a minimal left pleural effusion. No edema or consolidation. Heart is upper normal in size with pulmonary vascularity normal. There is aortic  atherosclerosis. No adenopathy. There is degenerative change in the thoracic spine. IMPRESSION: Minimal left pleural effusion. No edema or consolidation. Stable cardiac silhouette. There is aortic atherosclerosis. Aortic Atherosclerosis (ICD10-I70.0). Electronically Signed   By: Lowella Grip III M.D.   On: 05/01/2018 18:08    Medications:   . aspirin EC  81 mg Oral Daily  . clopidogrel  75 mg Oral Daily  . enoxaparin (LOVENOX) injection  30 mg Subcutaneous Q24H  . furosemide  40 mg Oral Daily  . gabapentin  100 mg Oral QHS  . insulin aspart  0-15 Units Subcutaneous TID WC  . insulin aspart  0-5 Units Subcutaneous QHS  . insulin glargine  20 Units Subcutaneous BH-q7a  . [START ON 05/03/2018] isosorbide mononitrate  60 mg Oral Daily  . metoprolol succinate  100 mg Oral Daily  . pravastatin  40 mg Oral q1800  . tamsulosin  0.4 mg Oral Daily   Continuous Infusions: . cefTRIAXone (ROCEPHIN)  IV       LOS: 0 days   Geradine Girt  [facility]   *Please refer to [email or web address], password [email or web address] to get updated schedule on who will round on this patient, as hospitalists switch teams weekly. If 7PM-7AM, please contact night-coverage at [email or web address], password [email or web address] for any overnight needs.  05/02/2018, 1:30 PM

## 2018-05-02 NOTE — Care Management Obs Status (Signed)
[facility] NOTIFICATION   Patient Details  Name: SELAH ZELMAN MRN: [id number] Date of Birth: 1930-07-19   Medicare Observation Status Notification Given:  Yes    Midge Minium RN, BSN, NCM-BC, ACM-RN [phone] 05/02/2018, 4:26 PM

## 2018-05-02 NOTE — Progress Notes (Signed)
Pt in bed resting. Call light with reach. Bed alarm placed on bed the patient educated to call nurse for assistance getting up. Urinal placed next to patient. Temp. Now within [facility]. Provider placed code sepsis order, but received during reports code sepsis to end per provider telephone conversation with Drucie Opitz. Per provider no antibiotics or fluids to be given.

## 2018-05-03 ENCOUNTER — Inpatient Hospital Stay: Payer: Medicare Other

## 2018-05-03 DIAGNOSIS — D709 Neutropenia, unspecified: Secondary | ICD-10-CM

## 2018-05-03 DIAGNOSIS — D46C Myelodysplastic syndrome with isolated del(5q) chromosomal abnormality: Secondary | ICD-10-CM | POA: Diagnosis not present

## 2018-05-03 DIAGNOSIS — N183 Chronic kidney disease, stage 3 (moderate): Secondary | ICD-10-CM | POA: Diagnosis not present

## 2018-05-03 DIAGNOSIS — I5022 Chronic systolic (congestive) heart failure: Secondary | ICD-10-CM

## 2018-05-03 DIAGNOSIS — I200 Unstable angina: Secondary | ICD-10-CM | POA: Diagnosis not present

## 2018-05-03 DIAGNOSIS — I25110 Atherosclerotic heart disease of native coronary artery with unstable angina pectoris: Secondary | ICD-10-CM | POA: Diagnosis not present

## 2018-05-03 DIAGNOSIS — R079 Chest pain, unspecified: Secondary | ICD-10-CM

## 2018-05-03 DIAGNOSIS — E785 Hyperlipidemia, unspecified: Secondary | ICD-10-CM

## 2018-05-03 DIAGNOSIS — D631 Anemia in chronic kidney disease: Secondary | ICD-10-CM | POA: Diagnosis not present

## 2018-05-03 DIAGNOSIS — N184 Chronic kidney disease, stage 4 (severe): Secondary | ICD-10-CM

## 2018-05-03 LAB — CBC
HCT: 23.7 % — ABNORMAL LOW (ref 39.0–52.0)
Hemoglobin: 7.1 g/dL — ABNORMAL LOW (ref 13.0–17.0)
MCH: 24.4 pg — ABNORMAL LOW (ref 26.0–34.0)
MCHC: 30 g/dL (ref 30.0–36.0)
MCV: 81.4 fL (ref 80.0–100.0)
NRBC: 0 % (ref 0.0–0.2)
PLATELETS: DECREASED 10*3/uL (ref 150–400)
RBC: 2.91 MIL/uL — AB (ref 4.22–5.81)
RDW: 25.9 % — ABNORMAL HIGH (ref 11.5–15.5)
WBC: 2.9 10*3/uL — ABNORMAL LOW (ref 4.0–10.5)

## 2018-05-03 LAB — BASIC METABOLIC PANEL
ANION GAP: 7 (ref 5–15)
BUN: 34 mg/dL — ABNORMAL HIGH (ref 8–23)
CO2: 24 mmol/L (ref 22–32)
Calcium: 8.4 mg/dL — ABNORMAL LOW (ref 8.9–10.3)
Chloride: 105 mmol/L (ref 98–111)
Creatinine, Ser: 1.9 mg/dL — ABNORMAL HIGH (ref 0.61–1.24)
GFR calc Af Amer: 35 mL/min — ABNORMAL LOW (ref >60)
GFR, EST NON AFRICAN AMERICAN: 31 mL/min — AB (ref >60)
Glucose, Bld: 145 mg/dL — ABNORMAL HIGH (ref 70–99)
POTASSIUM: 3.7 mmol/L (ref 3.5–5.1)
SODIUM: 136 mmol/L (ref 135–145)

## 2018-05-03 LAB — GLUCOSE, CAPILLARY
GLUCOSE-CAPILLARY: 141 mg/dL — AB (ref 70–99)
Glucose-Capillary: 146 mg/dL — ABNORMAL HIGH (ref 70–99)

## 2018-05-03 LAB — PREPARE RBC (CROSSMATCH)

## 2018-05-03 MED ORDER — FUROSEMIDE 10 MG/ML IJ SOLN
20.0000 mg | Freq: Once | INTRAMUSCULAR | Status: AC
  Administered 2018-05-03: 20 mg via INTRAVENOUS
  Filled 2018-05-03: qty 2

## 2018-05-03 MED ORDER — ASPIRIN 81 MG PO TBEC: 81.0000 mg | DELAYED_RELEASE_TABLET | Freq: Every day | ORAL | 0 refills | Status: DC

## 2018-05-03 MED ORDER — ISOSORBIDE MONONITRATE ER 60 MG PO TB24: 60.0000 mg | ORAL_TABLET | Freq: Every day | ORAL | 0 refills | Status: DC

## 2018-05-03 MED ORDER — CEPHALEXIN 500 MG PO CAPS: 500.0000 mg | ORAL_CAPSULE | Freq: Four times a day (QID) | ORAL | 0 refills | Status: AC

## 2018-05-03 MED ORDER — SODIUM CHLORIDE 0.9% IV SOLUTION: Freq: Once | INTRAVENOUS | Status: DC

## 2018-05-03 MED ORDER — PRAVASTATIN SODIUM 40 MG PO TABS: 40.0000 mg | ORAL_TABLET | Freq: Every day | ORAL | 0 refills | Status: DC

## 2018-05-03 MED ORDER — NITROGLYCERIN 0.4 MG SL SUBL: 0.4000 mg | SUBLINGUAL_TABLET | SUBLINGUAL | 3 refills | Status: DC | PRN

## 2018-05-03 NOTE — Progress Notes (Signed)
Brianne RN made aware @ 1308 that there are 2 units of blood ready per Zach in blood bank.

## 2018-05-03 NOTE — Progress Notes (Signed)
Progress Note  Patient Name: Ricardo Watkins Date of Encounter: 05/03/2018  Primary Cardiologist: [name], MD   Subjective   Feels well. Chest pain resolved without recurrence. No dyspnea.  Inpatient Medications    Scheduled Meds: . aspirin EC  81 mg Oral Daily  . clopidogrel  75 mg Oral Daily  . enoxaparin (LOVENOX) injection  30 mg Subcutaneous Q24H  . furosemide  40 mg Oral Daily  . gabapentin  100 mg Oral QHS  . insulin aspart  0-15 Units Subcutaneous TID WC  . insulin aspart  0-5 Units Subcutaneous QHS  . insulin glargine  20 Units Subcutaneous BH-q7a  . isosorbide mononitrate  60 mg Oral Daily  . metoprolol succinate  100 mg Oral Daily  . pravastatin  40 mg Oral q1800  . tamsulosin  0.4 mg Oral Daily   Continuous Infusions: . cefTRIAXone (ROCEPHIN)  IV Stopped (05/02/18 1425)   PRN Meds: acetaminophen, morphine injection, ondansetron (ZOFRAN) IV, traMADol   Vital Signs    Vitals:   05/02/18 1206 05/02/18 1900 05/03/18 0020 05/03/18 0520  BP: 123/62 (!) 147/70 130/63 133/62  Pulse: 73 98 77 80  Resp: 17 16 16 18   Temp: 98.1 F (36.7 C) 99.1 F (37.3 C) 98.2 F (36.8 C) 97.7 F (36.5 C)  TempSrc: Oral Oral Oral Oral  SpO2: 93% 94% 97% 96%  Weight:      Height:        Intake/Output Summary (Last 24 hours) at 05/03/2018 0843 Last data filed at 05/03/2018 0500 Gross per 24 hour  Intake 100 ml  Output 425 ml  Net -325 ml   Filed Weights   05/01/18 0556  Weight: 82.1 kg    Telemetry    NSR with occ PVCs- Personally Reviewed  ECG    None today.  Physical Exam   GEN: No acute distress.  Elderly and frail. Neck: No JVD Cardiac: RRR, gr 3/6 systolic murmur RUSB, no  rubs, or gallops.  Respiratory: Clear to auscultation bilaterally. GI: Soft, nontender, non-distended  MS: No edema; No deformity. Neuro:  Nonfocal  Psych: Normal affect   Labs    Chemistry Recent Labs  Lab 05/01/18 0610 05/03/18 0312  NA 139 136  K 4.0 3.7    CL 109 105  CO2 23 24  GLUCOSE 166* 145*  BUN 36* 34*  CREATININE 1.87* 1.90*  CALCIUM 8.8* 8.4*  GFRNONAA 31* 31*  GFRAA 36* 35*  ANIONGAP 7 7     Hematology Recent Labs  Lab 04/27/18 1417 05/01/18 0610 05/03/18 0312  WBC 2.8* 3.2* 2.9*  RBC 3.34* 3.43* 2.91*  HGB 8.3* 8.4* 7.1*  HCT 28.0* 28.7* 23.7*  MCV 83.8 83.7 81.4  MCH 24.9* 24.5* 24.4*  MCHC 29.6* 29.3* 30.0  RDW 25.7* 25.9* 25.9*  PLT 115* PLATELET CLUMPS NOTED ON SMEAR, UNABLE TO ESTIMATE PLATELET CLUMPS NOTED ON SMEAR, COUNT APPEARS DECREASED    Cardiac Enzymes Recent Labs  Lab 05/01/18 1656 05/02/18 0145 05/02/18 0518  TROPONINI 0.08* 0.11* 0.10*    Recent Labs  Lab 05/01/18 0608 05/01/18 1056  TROPIPOC 0.00 0.06     BNP Recent Labs  Lab 05/01/18 0610  BNP 525.5*     DDimer No results for input(s): DDIMER in the last 168 hours.   Radiology    Dg Chest Port 1 View  Result Date: 05/01/2018 CLINICAL DATA:  Sepsis EXAM: PORTABLE CHEST 1 VIEW COMPARISON:  May 01, 2018 chest radiograph obtained earlier in the day FINDINGS: Currently  there Is a minimal left pleural effusion. No edema or consolidation. Heart is upper normal in size with pulmonary vascularity normal. There is aortic atherosclerosis. No adenopathy. There is degenerative change in the thoracic spine. IMPRESSION: Minimal left pleural effusion. No edema or consolidation. Stable cardiac silhouette. There is aortic atherosclerosis. Aortic Atherosclerosis (ICD10-I70.0). Electronically Signed   By: Lowella Grip III M.D.   On: 05/01/2018 18:08   Dg Finger Index Right  Result Date: 05/02/2018 CLINICAL DATA:  Generalized right index finger pain for the past 2 days since tripping and falling outside while at home. History of diabetes, former smoker. EXAM: RIGHT INDEX FINGER 2+V COMPARISON:  None. FINDINGS: The bones are subjectively adequately mineralized. No acute phalangeal fracture is observed. There is subtle soft tissue  calcification adjacent to the radial aspect of the DIP joint but no donor site is observed. The interphalangeal joint spaces are reasonably well-maintained. There is moderate to severe loss of the second MCP joint space. The second metacarpal appears intact. There is diffuse soft tissue swelling. IMPRESSION: Probable contusion of the right index finger. No acute fracture nor dislocation. 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 Electronically Signed   By: David  Martinique M.D.   On: 05/02/2018 13:43    Cardiac Studies   Echo 02/20/18 Study Conclusions  - Left ventricle: The cavity size was mildly dilated. Systolic function was moderately to severely reduced. The estimated ejection fraction was in the range of 30% to 35%. There is moderate hypokinesis of the mid-apicalapical myocardium. There is moderate hypokinesis of the inferior myocardium. Doppler parameters are consistent with abnormal left ventricular relaxation (grade 1 diastolic dysfunction). - Aortic valve: Left coronary cusp mobility was moderately restricted. Transvalvular velocity was increased, due to stenosis. There was moderate to severe stenosis accounting for poor systolic function. Valve area (VTI): 2 cm^2. Valve area (Vmax): 2.12 cm^2. Valve area (Vmean): 1.96 cm^2. - Mitral valve: There was moderate regurgitation. - Left atrium: The atrium was moderately dilated. - Right atrium: The atrium was moderately dilated. - Tricuspid valve: There was moderate regurgitation. - Pulmonary arteries: Systolic pressure was mildly increased.   Patient Profile     84 y.o. male with AS, CAD, CKD, myelodysplasia and pancytopenia, IDDM and recurrent pleural effusions. Multiple recent hospital admissions for CHF, infection, anemia. Presents  for evaluation of chest pain.  Assessment & Plan    1. Chest pain with mildly elevated troponin  - EKG with lat ST depression LBBB. Pain resolved. Etiology  includes possible ACS versus demand ischemia. Troponin levels flat. Pain much improved with addition of nitrates. Will continue Imdur to 60 mg daily. Will need Rx for Sl Ntg at home. Given multiple co morbidities he is a poor candidate for any  invasive procedures. Will manage conservatively. 2. Acute/Chronic systolic and diastolic HF. Last admit diuresed and he felt better. Volume status appears stable on current diuretics.  3. Cardiomyopathy from August until Sept drop in EF to 30-35% from 45-50%.   4. AS most likely moderate, not a candidate for surgery at all and a very poor candidate for TAVR. I would not pursue this further 5. MDS/ITP/pancytopenia, last transfusion 04/20/18. Hgb has decreased to 7.1. Will need transfusion to minimize ischemia.   6. CKD IV stable  7. CAD on cath 2004.  8. PAD with renal artery stent on plavix.         For questions or updates, please contact [place] Please consult [email or web address] for contact info under  Signed, Peter Martinique, MD  05/03/2018, 8:43 AM

## 2018-05-03 NOTE — Discharge Summary (Signed)
Physician Discharge Summary  EATON FOLMAR FHQ:[id number] DOB: July 02, 1930 DOA: 05/01/2018  PCP: [name], MD  Admit date: 05/01/2018 Discharge date: 05/03/2018  Time spent: 40 minutes  Recommendations for Outpatient Follow-up:  1. Follow up with primary cardiologist 2-3 weeks for evaluation of chest pain  2. Take medications as directed 3. 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 male with medical history significant ofPVD s/p R renal artery stent; remote prostate CA s/p seed radiation; NHL; HTN; HLD; DM; chronic ITP; CHF; and CAD s/p stent presenting with chest pain.He was hospitalized last week for chest pain. He was doing pretty well. He awoke 05/02/18 about 0400 with tightness in his chest. He was asleep when it started. The pain lasted about an hour. It improved after he took a pain pill, Tramadol. It recurred while in the ER and lasted about 3 hours. He was given more pain medication and it resolved. He never received NTG. Last stress test was 2012. No h/o CAD requiring stent. "He's not a candidate for heart cath - his heart's too weak and he's a bleeder, according to Dr. MartiniqueShoals Hospital Course:   Unstable angina -Patienthospitalized last week with chest pain, plan for dobutamine stress test was considered -Also with moderate to severe AS with low flow  noted -troponins slightly elevated but flat, ekg with lat ST depression and LBBB.  -imdur started day prior to admission at 30 mg (patient never got) and increased to 60 mg.evaluated by cardiology who opined poor candidate for any invasive procedures and recommended conservative management.   Chronic combined CHF - remained compensated.  HTN -controlled -Continue Toprol XL  HLD -Start Pravachol  DM -Last A1c was 5.2   Fever -resolved at discharge -max: 102 -recently diagnosed with strep viridans bacteremia and coag negative staph bacteremia and completed a round of doxycycline for 10 days -repeat blood cultures negative thus far -his right index finger appears infected- swollen with broken skin overlying -discharge with keflex   Stage IV CKD with anemia -Hg drifted down to 7.1. -transfused 2 units PRBC's before discharge -recommend OP cbc in 1-2 days  MDS with chronic neutropenia -goes to cancer center every Wednesday  Procedures:  transfusion  Consultations:  [place] [facility]  Discharge Exam: Vitals:   05/03/18 1332 05/03/18 1358  BP: 121/62 (!) 118/59  Pulse: 84 84  Resp: 16 16  Temp: 98.5 F (36.9 C) 98.3 F (36.8 C)  SpO2: 99% 96%    General: appears pale but in no acute distress Cardiovascular: rrr no mgr no LE edema Respiratory: normal effort BS clear bilaterally no wheeze  Discharge Instructions    Allergies as of 05/03/2018      Reactions   Glucophage [metformin Hydrochloride] Other (See Comments)   Chest pain   Zetia [ezetimibe] Other (See Comments)   weakness   Fenofibrate Rash  Niacin-lovastatin Er Rash      Medication List    TAKE these medications   acetaminophen 500 MG tablet Commonly known as:  TYLENOL Take 1,000 mg by mouth every 6 (six) hours as needed for mild pain, moderate pain or headache.   aspirin 81 MG EC tablet Take 1 tablet (81 mg total) by mouth daily. Start taking on:  05/04/2018   cephALEXin 500  MG capsule Commonly known as:  KEFLEX Take 1 capsule (500 mg total) by mouth 4 (four) times daily for 7 days.   clopidogrel 75 MG tablet Commonly known as:  PLAVIX TAKE 1 TABLET BY MOUTH ONCE A DAY WITH BREAKFAST What changed:  See the new instructions.   furosemide 40 MG tablet Commonly known as:  LASIX Take 60mg  daily by mouth. (3 tablets) Except excess swelling or weight gain take an additional 40mg  daily (2 tablets) What changed:    how much to take  how to take this  when to take this  additional instructions   gabapentin 100 MG capsule Commonly known as:  NEURONTIN TAKE 1 CAPSULE BY MOUTH AT BEDTIME   Insulin Glargine 100 UNIT/ML Solostar Pen Commonly known as:  LANTUS INJECT 20 UNITS EVERY NIGHT AT BEDTIME What changed:    how much to take  how to take this  when to take this   isosorbide mononitrate 60 MG 24 hr tablet Commonly known as:  IMDUR Take 1 tablet (60 mg total) by mouth daily. Start taking on:  05/04/2018   loperamide 2 MG capsule Commonly known as:  IMODIUM Take 2 mg by mouth as needed for diarrhea or loose stools.   meclizine 12.5 MG tablet Commonly known as:  ANTIVERT Take 1 tablet (12.5 mg total) by mouth 3 (three) times daily as needed for dizziness.   metoprolol succinate 100 MG 24 hr tablet Commonly known as:  TOPROL-XL TAKE 1 TABLET BY MOUTH ONCE DAILY   multivitamin with minerals Tabs tablet Take 1 tablet by mouth every morning.   nitroGLYCERIN 0.4 MG SL tablet Commonly known as:  NITROSTAT Place 1 tablet (0.4 mg total) under the tongue every 5 (five) minutes as needed for chest pain.   NON FORMULARY Inject 1 Dose into the vein once a week. Platelet booster   OVER THE COUNTER MEDICATION Place 1 drop into both eyes daily as needed (dry eyes). Over the counter lubricating eye drop   pravastatin 40 MG tablet Commonly known as:  PRAVACHOL Take 1 tablet (40 mg total) by mouth daily at 6 PM.   tamsulosin 0.4 MG Caps  capsule Commonly known as:  FLOMAX TAKE 1 CAPSULE BY MOUTH ONCE DAILY   traMADol 50 MG tablet Commonly known as:  ULTRAM Take 1 tablet (50 mg total) by mouth every 6 (six) hours as needed for moderate pain.      Allergies  Allergen Reactions  . Glucophage [Metformin Hydrochloride] Other (See Comments)    Chest pain  . Zetia [Ezetimibe] Other (See Comments)    weakness  . Fenofibrate Rash  . Niacin-Lovastatin Er Rash   Follow-up Information    [name], MD. Schedule an appointment as soon as possible for a visit in 1 week(s).   Specialty:  Family Medicine Contact information: [address] [place] [address] [phone]        [name], MD. Schedule an appointment as soon as possible for a visit in 3 week(s).   Specialty:  Cardiology Contact information: [address] [place] [address] [phone]            The results of significant diagnostics from this hospitalization (including imaging, microbiology, ancillary and laboratory) are listed below for reference.    Significant Diagnostic Studies: Ct Abdomen Pelvis Wo Contrast  Result Date: 04/14/2018 CLINICAL DATA:  84 y/o M; dizziness and chills starting this morning. Upper abdominal/chest pain. EXAM: CT ABDOMEN AND PELVIS WITHOUT CONTRAST TECHNIQUE: Multidetector CT imaging of the abdomen and pelvis was performed following the standard protocol without IV contrast. COMPARISON:  06/02/2017 CT of the abdomen and pelvis. FINDINGS: Lower chest: Small right pleural effusion and trace left pleural effusion. Previously identified nodules in the lung bases are no longer evident, likely having been infectious/inflammatory. Smooth interlobular septal thickening. Opacity of the dependent right lung base. Coronary artery calcific atherosclerosis. Hepatobiliary: Scattered liver calcifications compatible with prior granulomatous disease. No additional focal liver lesion. Normal  gallbladder. No biliary ductal dilatation. Pancreas: Unremarkable. No pancreatic ductal dilatation or surrounding inflammatory changes. Spleen: Normal in size without focal abnormality. Adrenals/Urinary Tract: Adrenal glands are unremarkable. Tiny nonobstructing stones in the right kidney. Scattered small kidney hypodensities, likely cysts, measuring up to 17 mm at the right interpolar kidney. No hydronephrosis. Normal bladder. Stomach/Bowel: Stomach is within normal limits. Normal appendix/appendix stump. No evidence of bowel wall thickening, distention, or inflammatory changes. Sigmoid diverticulosis without findings of acute diverticulitis. Vascular/Lymphatic: Aortic atherosclerosis. Right renal artery stent. No enlarged abdominal or pelvic lymph nodes. Reproductive: Brachytherapy seeds. Other: Large right and small left inguinal hernias containing fat. Musculoskeletal: No fracture is seen. Mild lumbar levocurvature. Lumbar spondylosis with prominent facet arthropathy. IMPRESSION: 1. No acute process identified as explanation for abdominal pain. 2. Small right pleural effusion and trace left pleural effusion. 3. Dependent right lower lobe opacity, probably atelectasis, less likely pneumonia. 4. Smooth interlobular septal thickening of the lungs, probably mild interstitial pulmonary edema. 5. Sigmoid diverticulosis without findings of acute diverticulitis. 6. Stable large right and small left inguinal hernias containing fat. 7. Aortic Atherosclerosis (ICD10-I70.0). Coronary artery calcification. Electronically Signed   By: Kristine Garbe M.D.   On: 04/14/2018 19:01   Dg Chest 2 View  Result Date: 05/01/2018 CLINICAL DATA:  Acute onset of central chest pain and cough. EXAM: CHEST - 2 VIEW COMPARISON:  Chest radiograph performed 04/20/2018 FINDINGS: The lungs are well-aerated. Medial right basilar airspace opacity may reflect pneumonia or asymmetric pulmonary edema. Mild vascular congestion is noted.  A small left pleural effusion is suspected. There is no evidence of pneumothorax. The heart is normal in size; the mediastinal contour is within normal limits. No acute osseous abnormalities are seen. IMPRESSION: Medial right basilar airspace opacity may reflect pneumonia or asymmetric pulmonary edema. Mild vascular congestion noted. Suspect small left pleural effusion. Electronically Signed   By: Garald Balding M.D.   On: 05/01/2018 06:36   Dg Chest 2 View  Result Date: 04/20/2018 CLINICAL DATA:  Chest pain for several hours EXAM: CHEST - 2 VIEW COMPARISON:  04/16/2018 FINDINGS: Cardiac shadow is stable. Aortic calcifications are again seen. Minimal blunting of the costophrenic angles is noted. No focal infiltrate is seen. No bony abnormality is noted. IMPRESSION: Minimal blunting of the costophrenic angles consistent with mild pleural fluid. No focal infiltrate is seen. Electronically Signed   By: Inez Catalina M.D.   On: 04/20/2018 10:03   Dg Chest 2 View  Result Date: 04/16/2018 CLINICAL DATA:  Daughter stated, they called from here to say he has some kind of infection  that showed up later from Friday. Told to come back for different antibiotic. Hx of CAD, CHF, diabetes, DVT, HTN, pleural effusion. Pt states he is having no CP, SOB, N/V/D. EXAM: CHEST - 2 VIEW COMPARISON:  04/14/2018 FINDINGS: Cardiac silhouette top-normal in size. No mediastinal or hilar masses. No evidence of adenopathy. Lungs are clear.  No pleural effusion or pneumothorax. Skeletal structures are intact. IMPRESSION: No active cardiopulmonary disease. Electronically Signed   By: Lajean Manes M.D.   On: 04/16/2018 11:26   Dg Chest 2 View  Result Date: 04/14/2018 CLINICAL DATA:  Fever EXAM: CHEST - 2 VIEW COMPARISON:  02/19/2018 FINDINGS: Small pleural effusions. No focal consolidation. Stable cardiomediastinal silhouette with aortic atherosclerosis. Mild central vascular congestion. No pneumothorax. IMPRESSION: 1. Small pleural  effusions without focal consolidation 2. Borderline cardiomegaly with central vascular congestion Electronically Signed   By: Donavan Foil M.D.   On: 04/14/2018 18:16   Dg Chest Port 1 View  Result Date: 05/01/2018 CLINICAL DATA:  Sepsis EXAM: PORTABLE CHEST 1 VIEW COMPARISON:  May 01, 2018 chest radiograph obtained earlier in the day FINDINGS: Currently there Is a minimal left pleural effusion. No edema or consolidation. Heart is upper normal in size with pulmonary vascularity normal. There is aortic atherosclerosis. No adenopathy. There is degenerative change in the thoracic spine. IMPRESSION: Minimal left pleural effusion. No edema or consolidation. Stable cardiac silhouette. There is aortic atherosclerosis. Aortic Atherosclerosis (ICD10-I70.0). Electronically Signed   By: Lowella Grip III M.D.   On: 05/01/2018 18:08   Dg Finger Index Right  Result Date: 05/02/2018 CLINICAL DATA:  Generalized right index finger pain for the past 2 days since tripping and falling outside while at home. History of diabetes, former smoker. EXAM: RIGHT INDEX FINGER 2+V COMPARISON:  None. FINDINGS: The bones are subjectively adequately mineralized. No acute phalangeal fracture is observed. There is subtle soft tissue calcification adjacent to the radial aspect of the DIP joint but no donor site is observed. The interphalangeal joint spaces are reasonably well-maintained. There is moderate to severe loss of the second MCP joint space. The second metacarpal appears intact. There is diffuse soft tissue swelling. IMPRESSION: Probable contusion of the right index finger. No acute fracture nor dislocation. 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 Electronically Signed   By: David  Martinique M.D.   On: 05/02/2018 13:43    Microbiology: Recent Results (from the past 240 hour(s))  Culture, blood (routine x 2)     Status: None (Preliminary result)   Collection Time: 05/01/18  4:56 PM  Result  Value Ref Range Status   Specimen Description BLOOD LEFT ANTECUBITAL  Final   Special Requests   Final    BOTTLES DRAWN AEROBIC ONLY Blood Culture adequate volume   Culture   Final    NO GROWTH 2 DAYS Performed at [facility], [address], [place], [place] [address]    Report Status PENDING  Incomplete  Culture, blood (routine x 2)     Status: None (Preliminary result)   Collection Time: 05/01/18  4:56 PM  Result Value Ref Range Status   Specimen Description BLOOD RIGHT ANTECUBITAL  Final   Special Requests   Final    BOTTLES DRAWN AEROBIC ONLY Blood Culture adequate volume   Culture   Final    NO GROWTH 2 DAYS Performed at [facility], [address], [place], [place] [address]    Report Status PENDING  Incomplete     Labs:  Basic Metabolic Panel: Recent Labs  Lab 05/01/18 0610 05/03/18 0312  NA 139 136  K 4.0 3.7  CL 109 105  CO2 23 24  GLUCOSE 166* 145*  BUN 36* 34*  CREATININE 1.87* 1.90*  CALCIUM 8.8* 8.4*   Liver Function Tests: No results for input(s): AST, ALT, ALKPHOS, BILITOT, PROT, ALBUMIN in the last 168 hours. No results for input(s): LIPASE, AMYLASE in the last 168 hours. No results for input(s): AMMONIA in the last 168 hours. CBC: Recent Labs  Lab 04/27/18 1417 05/01/18 0610 05/03/18 0312  WBC 2.8* 3.2* 2.9*  NEUTROABS 0.7*  --   --   HGB 8.3* 8.4* 7.1*  HCT 28.0* 28.7* 23.7*  MCV 83.8 83.7 81.4  PLT 115* PLATELET CLUMPS NOTED ON SMEAR, UNABLE TO ESTIMATE PLATELET CLUMPS NOTED ON SMEAR, COUNT APPEARS DECREASED   Cardiac Enzymes: Recent Labs  Lab 05/01/18 1656 05/02/18 0145 05/02/18 0518  TROPONINI 0.08* 0.11* 0.10*   BNP: BNP (last 3 results) Recent Labs    02/19/18 0635 04/20/18 0852 05/01/18 0610  BNP 820.7* 575.0* 525.5*    ProBNP (last 3 results) No results for input(s): PROBNP in the last 8760 hours.  CBG: Recent Labs  Lab 05/02/18 1157 05/02/18 1651 05/02/18 2124 05/03/18 0652 05/03/18 1136  GLUCAP  142* 169* 166* 141* 146*       Signed:  Radene Gunning NP [facility] 05/03/2018, 2:10 PM

## 2018-05-03 NOTE — Progress Notes (Signed)
Pt alert and oriented in NAD. Pt verbalized understanding of discharge instructions. 

## 2018-05-04 LAB — TYPE AND SCREEN
ABO/RH(D): O POS
Antibody Screen: NEGATIVE
UNIT DIVISION: 0
UNIT DIVISION: 0

## 2018-05-04 LAB — BPAM RBC
BLOOD PRODUCT EXPIRATION DATE: 201912112359
BLOOD PRODUCT EXPIRATION DATE: 201912112359
ISSUE DATE / TIME: 201911131509
ISSUE DATE / TIME: 201911131335
UNIT TYPE AND RH: 5100
Unit Type and Rh: 5100

## 2018-05-06 LAB — CULTURE, BLOOD (ROUTINE X 2)
CULTURE: NO GROWTH
Culture: NO GROWTH
SPECIAL REQUESTS: ADEQUATE
Special Requests: ADEQUATE

## 2018-05-10 ENCOUNTER — Inpatient Hospital Stay: Payer: Medicare Other

## 2018-05-10 VITALS — BP 143/55

## 2018-05-10 DIAGNOSIS — D539 Nutritional anemia, unspecified: Secondary | ICD-10-CM

## 2018-05-10 DIAGNOSIS — D696 Thrombocytopenia, unspecified: Secondary | ICD-10-CM

## 2018-05-10 DIAGNOSIS — D46C Myelodysplastic syndrome with isolated del(5q) chromosomal abnormality: Secondary | ICD-10-CM

## 2018-05-10 DIAGNOSIS — J9601 Acute respiratory failure with hypoxia: Secondary | ICD-10-CM

## 2018-05-10 DIAGNOSIS — D631 Anemia in chronic kidney disease: Secondary | ICD-10-CM

## 2018-05-10 DIAGNOSIS — D693 Immune thrombocytopenic purpura: Secondary | ICD-10-CM

## 2018-05-10 DIAGNOSIS — N183 Chronic kidney disease, stage 3 unspecified: Secondary | ICD-10-CM

## 2018-05-10 LAB — CBC WITH DIFFERENTIAL/PLATELET
ABS IMMATURE GRANULOCYTES: 0.01 10*3/uL (ref 0.00–0.07)
BASOS PCT: 0 %
Basophils Absolute: 0 10*3/uL (ref 0.0–0.1)
Eosinophils Absolute: 0 10*3/uL (ref 0.0–0.5)
Eosinophils Relative: 0 %
HCT: 29.6 % — ABNORMAL LOW (ref 39.0–52.0)
Hemoglobin: 9 g/dL — ABNORMAL LOW (ref 13.0–17.0)
IMMATURE GRANULOCYTES: 0 %
LYMPHS ABS: 1.5 10*3/uL (ref 0.7–4.0)
LYMPHS PCT: 62 %
MCH: 26 pg (ref 26.0–34.0)
MCHC: 30.4 g/dL (ref 30.0–36.0)
MCV: 85.5 fL (ref 80.0–100.0)
MONO ABS: 0.4 10*3/uL (ref 0.1–1.0)
MONOS PCT: 17 %
NEUTROS ABS: 0.5 10*3/uL — AB (ref 1.7–7.7)
NEUTROS PCT: 21 %
PLATELETS: 116 10*3/uL — AB (ref 150–400)
RBC: 3.46 MIL/uL — ABNORMAL LOW (ref 4.22–5.81)
RDW: 23.5 % — AB (ref 11.5–15.5)
WBC: 2.5 10*3/uL — ABNORMAL LOW (ref 4.0–10.5)
nRBC: 0 % (ref 0.0–0.2)

## 2018-05-10 MED ORDER — ROMIPLOSTIM 250 MCG ~~LOC~~ SOLR
1.0000 ug/kg | SUBCUTANEOUS | Status: DC
  Filled 2018-05-10 (×2): qty 0.16

## 2018-05-10 MED ORDER — DARBEPOETIN ALFA 200 MCG/0.4ML IJ SOSY
200.0000 ug | PREFILLED_SYRINGE | Freq: Once | INTRAMUSCULAR | Status: AC
  Administered 2018-05-10: 200 ug via SUBCUTANEOUS

## 2018-05-10 MED ORDER — ROMIPLOSTIM INJECTION 500 MCG
790.0000 ug | SUBCUTANEOUS | Status: DC
  Administered 2018-05-10: 790 ug via SUBCUTANEOUS
  Filled 2018-05-10: qty 1

## 2018-05-11 ENCOUNTER — Other Ambulatory Visit: Payer: Self-pay

## 2018-05-11 ENCOUNTER — Ambulatory Visit (INDEPENDENT_AMBULATORY_CARE_PROVIDER_SITE_OTHER): Payer: Medicare Other | Admitting: Family Medicine

## 2018-05-11 ENCOUNTER — Encounter: Payer: Self-pay | Admitting: Family Medicine

## 2018-05-11 VITALS — BP 110/60 | HR 66 | Temp 98.3°F | Resp 16 | Ht 69.0 in | Wt 188.0 lb

## 2018-05-11 DIAGNOSIS — I5022 Chronic systolic (congestive) heart failure: Secondary | ICD-10-CM | POA: Diagnosis not present

## 2018-05-11 DIAGNOSIS — R079 Chest pain, unspecified: Secondary | ICD-10-CM

## 2018-05-11 DIAGNOSIS — Z09 Encounter for follow-up examination after completed treatment for conditions other than malignant neoplasm: Secondary | ICD-10-CM | POA: Diagnosis not present

## 2018-05-11 DIAGNOSIS — E538 Deficiency of other specified B group vitamins: Secondary | ICD-10-CM

## 2018-05-11 DIAGNOSIS — I6522 Occlusion and stenosis of left carotid artery: Secondary | ICD-10-CM | POA: Diagnosis not present

## 2018-05-11 DIAGNOSIS — N189 Chronic kidney disease, unspecified: Secondary | ICD-10-CM | POA: Diagnosis not present

## 2018-05-11 DIAGNOSIS — D649 Anemia, unspecified: Secondary | ICD-10-CM

## 2018-05-11 DIAGNOSIS — D631 Anemia in chronic kidney disease: Secondary | ICD-10-CM

## 2018-05-11 DIAGNOSIS — M009 Pyogenic arthritis, unspecified: Secondary | ICD-10-CM

## 2018-05-11 MED ORDER — DOXYCYCLINE HYCLATE 100 MG PO TABS: 100.0000 mg | ORAL_TABLET | Freq: Two times a day (BID) | ORAL | 0 refills | Status: DC

## 2018-05-11 MED ORDER — ISOSORBIDE MONONITRATE ER 60 MG PO TB24: 60.0000 mg | ORAL_TABLET | Freq: Every day | ORAL | 5 refills | Status: DC

## 2018-05-11 NOTE — Progress Notes (Signed)
Subjective:    Patient ID: Ricardo Watkins, male    DOB: Sep 25, 1930, 84 y.o.   MRN: [id number] Patient was recently admitted to the hospital with chest pain.  He has been admitted twice since I last saw him.  I have copied relevant portions of the discharge summary below and included them for my reference: Admit date: 05/01/2018 Discharge date: 05/03/2018  Time spent: 40 minutes  Recommendations for Outpatient Follow-up:  1. Follow up with primary cardiologist 2-3 weeks for evaluation of chest pain  2. Take medications as directed 3. Follow up with Heme/onc for CBC to track Hg   Discharge Diagnoses:  Principal Problem:   Unstable angina (Ricardo Watkins) Active Problems:   Essential hypertension   MDS (myelodysplastic syndrome) with 5q deletion (HCC)   Type 2 diabetes mellitus (HCC)   Anemia in chronic kidney disease   Dyslipidemia   CKD (chronic kidney disease), stage IV (HCC)   Chronic neutropenia (HCC)   Chronic systolic CHF (congestive heart failure) (Ricardo Watkins)   Fever   Discharge Condition: stable pain free  Diet recommendation: carb modified heart healthy     Filed Weights   05/01/18 0556  Weight: 82.1 kg    History of present illness:  Ricardo R Appleis an 84 y.o.malewith medical history significant ofPVD s/p R renal artery stent; remote prostate CA s/p seed radiation; NHL; HTN; HLD; DM; chronic ITP; CHF; and CAD s/p stent presenting with chest pain.He was hospitalized last week for chest pain. He was doing pretty well. He awoke 05/02/18 about 0400 with tightness in his chest. He was asleep when it started. The pain lasted about an hour. It improved after he took a pain pill, Tramadol. It recurred while in the ER and lasted about 3 hours. He was given more pain medication and it resolved. He never received NTG. Last stress test was 2012. No h/o CAD requiring stent. "He's not a candidate for heart cath - his heart's too weak and he's a bleeder, according [facility]  [facility]:   Unstable angina -Patienthospitalized last week with chest pain, plan for dobutamine stress test was considered -Also with moderate to severe AS with low flow noted -troponins slightly elevated but flat, ekg with lat ST depression and LBBB.  -imdur started day prior to admission at 30 mg (patient never got) and increased to 60 mg.evaluated by cardiology who opined poor candidate for any invasive procedures and recommended conservative management.   Chronic combined CHF - remained compensated.  HTN -controlled -Continue Toprol XL  HLD -Start Pravachol  DM -Last A1c was 5.2   Fever -resolved at discharge -max: 102 -recently diagnosed with strep viridans bacteremia and coag negative staph bacteremia and completed a round of doxycycline for 10 days -repeat blood cultures negative thus far -his right index finger appears infected- swollen with broken skin overlying -discharge with keflex   Stage IV CKD with anemia -Hg drifted down to 7.1. -transfused 2 units PRBC's before discharge -recommend OP cbc in 1-2 days  MDS with chronic neutropenia -goes to cancer center every Wednesday  05/11/18 Patient presents today for hospital follow-up.  Since discharge from the hospital, he has had a hemoglobin checked at the cancer center and it was up to 9 after his transfusion.  I reviewed his hemoglobin over the last 6 months.  He typically runs 10-10.5.  However over the last 2 months he is drifted down gradually into the eights and was as low as 7.1 on his previous hospital  admission.  This raises the concern of possibly occult blood loss on top of his chronic kidney disease and myelodysplastic syndrome as a cause of his worsening anemia.  I believe his worsening anemia likely contributed to his angina.  Patient has not had evaluation for an occult GI bleed.  He also continues to have redness and warmth over his right index finger.  Directly over  the PIP joint is an area of induration and pain with decreased range of motion suggesting possible septic arthritis he is unable to bend the finger despite taking Keflex.  Occurred after he suffered a recent cut prior to his hospitalization.  He denies any chest pain.  He has not had any further chest pain since starting Imdur.      Past Medical History:  Diagnosis Date  . Adenomatous colon polyp   . CAD (coronary artery disease)    30% LAD Stenosis, 70% ramus intermedius stenosis, treated with PTCA and angioplasty by Dr Albertine Patricia 2004  . Cataract    right eye  . CHF (congestive heart failure) ([place])   . Chronic ITP (idiopathic thrombocytopenia) (HCC)   . Cough, persistent 11/04/2015  . Diverticulosis   . DM (diabetes mellitus) ([place])   . DVT (deep venous thrombosis) ([place])    secondary to surgery  . Dyspnea   . Fever 05/01/2018  . Hyperlipidemia   . Hypertension   . Inguinal hernia    right  . Laceration of finger of right hand 05/01/2018   INDEX FINGER  . 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 Microcytic anemia 07/07/2015  . 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 Non Hodgkin's lymphoma ([place])   . Pleural effusion, left 11/04/2015  . Prostate CA ([place]) 09/10/2011   Gleason 3+3 R, 3+4 L lobe May 2007 Rx Radioactive seed implants Dr [name]  . PVD (peripheral vascular disease) ([place])    rt renal artery stent  . Renal insufficiency   . Thrombocytopenia ([place])   . 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 Thyroid nodule    left lower lobe (annual monitoring).  . Vitamin D deficiency    Past Surgical History:  Procedure Laterality Date  . APPENDECTOMY     patient ?  Marland Kitchen CARDIAC CATHETERIZATION    . CATARACT EXTRACTION    . CHEST TUBE INSERTION Left 02/10/2016   Procedure: INSERTION PLEURAL DRAINAGE CATHETER;  Surgeon: [name], MD;  Location: [facility];  Service: Thoracic;  Laterality: Left;  . CHEST TUBE INSERTION Left 04/08/2016   Procedure: CHEST TUBE INSERTION;  Surgeon: [name], MD;  Location: [facility];  Service: Thoracic;  Laterality: Left;  . COLONOSCOPY    . CORONARY ANGIOPLASTY    . CYSTOURETHROSCOPY     ROBOTIC ARM NUCLETRON SEED IMPLANTATION OF PROSTATE  . EXPLORATORY LAPAROTOMY     For evaluation of lymphoma  . REMOVAL OF PLEURAL DRAINAGE CATHETER Left 04/08/2016   Procedure: REMOVAL OF PLEURAL DRAINAGE CATHETER;  Surgeon: [name], MD;  [facility];  Service: Thoracic;  Laterality: Left;   Current Outpatient Medications on File Prior to Visit  Medication Sig Dispense Refill  . acetaminophen (TYLENOL) 500 MG tablet Take 1,000 mg by mouth every 6 (six) hours as needed for mild pain, moderate pain or headache.     Marland Kitchen aspirin EC 81 MG  EC tablet Take 1 tablet (81 mg total) by mouth daily. 30 tablet 0  . clopidogrel (PLAVIX) 75 MG tablet TAKE 1 TABLET BY MOUTH ONCE A DAY WITH BREAKFAST 90 tablet 3  . furosemide (LASIX) 40 MG tablet Take 35m daily by mouth. (3 tablets) Except excess swelling or weight gain take an additional 458mdaily (2 tablets) (Patient taking differently: Take 40 mg by mouth daily. Take 606maily by mouth. (3 tablets) Except excess swelling or weight gain take an additional 49m27mily (2 tablets)) 100 tablet 3  . gabapentin (NEURONTIN) 100 MG capsule TAKE 1 CAPSULE BY MOUTH AT BEDTIME 90 capsule 3  . Insulin Glargine (LANTUS SOLOSTAR) 100 UNIT/ML Solostar Pen INJECT 20 UNITS EVERY NIGHT AT BEDTIME (Patient taking differently: Inject 20 Units into the skin every morning. INJECT 20 UNITS EVERY NIGHT AT BEDTIME) 5 pen 3  . loperamide (IMODIUM) 2 MG capsule Take 2 mg by mouth as needed for diarrhea or loose stools.    . meclizine (ANTIVERT) 12.5 MG tablet Take 1 tablet (12.5 mg total) by mouth 3 (three) times daily as needed for dizziness. 30 tablet 0  . metoprolol succinate  (TOPROL-XL) 100 MG 24 hr tablet TAKE 1 TABLET BY MOUTH ONCE DAILY 90 tablet 3  . Multiple Vitamin (MULTIVITAMIN WITH MINERALS) TABS tablet Take 1 tablet by mouth every morning.     . nitroGLYCERIN (NITROSTAT) 0.4 MG SL tablet Place 1 tablet (0.4 mg total) under the tongue every 5 (five) minutes as needed for chest pain. 100 tablet 3  . NON FORMULARY Inject 1 Dose into the vein once a week. Platelet booster    . OVER THE COUNTER MEDICATION Place 1 drop into both eyes daily as needed (dry eyes). Over the counter lubricating eye drop    . pravastatin (PRAVACHOL) 40 MG tablet Take 1 tablet (40 mg total) by mouth daily at 6 PM. 30 tablet 0  . tamsulosin (FLOMAX) 0.4 MG CAPS capsule TAKE 1 CAPSULE BY MOUTH ONCE DAILY 90 capsule 3  . traMADol (ULTRAM) 50 MG tablet Take 1 tablet (50 mg total) by mouth every 6 (six) hours as needed for moderate pain. 60 tablet 0   Current Facility-Administered Medications on File Prior to Visit  Medication Dose Route Frequency Provider Last Rate Last Dose  . cyanocobalamin ((VITAMIN B-12)) injection 1,000 mcg  1,000 mcg Subcutaneous Q30 days PickSusy Frizzle   1,000 mcg at 05/11/18 1223  . romiPLOStim (NPLATE) injection 770 mcg  9 mcg/kg Subcutaneous Weekly GorsHeath Lark   770 mcg at 03/16/18 1549  . romiPLOStim (NPLATE) injection 790 mcg  9 mcg/kg Subcutaneous Weekly GorsHeath Lark   790 mcg at 04/19/18 1556   Allergies  Allergen Reactions  . Glucophage [Metformin Hydrochloride] Other (See Comments)    Chest pain  . Zetia [Ezetimibe] Other (See Comments)    weakness  . Fenofibrate Rash  . Niacin-Lovastatin Er Rash   Social History   Socioeconomic History  . Marital status: Married    Spouse name: Not on file  . Number of children: 4  . Years of education: Not on file  . Highest education level: Not on file  Occupational History  . Occupation: retired  SociScientific laboratory technicianFinancial resource strain: Not on file  . Food insecurity:    Worry: Not on file     Inability: Not on file  . Transportation needs:    Medical: Not on file    Non-medical: Not on file  Tobacco Use  . Smoking status: Former Smoker    Packs/day: 0.50    Years: 20.00    Pack years: 10.00    Types: Pipe, Cigarettes    Last attempt to quit: 06/21/1958    Years since quitting: 59.9  . Smokeless tobacco: Never Used  Substance and Sexual Activity  . Alcohol use: No    Alcohol/week: 0.0 standard drinks  . Drug use: No  . Sexual activity: Yes    Partners: Female  Lifestyle  . Physical activity:    Days per week: Not on file    Minutes per session: Not on file  . Stress: Not on file  Relationships  . Social connections:    Talks on phone: Not on file    Gets together: Not on file    Attends religious service: Not on file    Active member of club or organization: Not on file    Attends meetings of clubs or organizations: Not on file    Relationship status: Not on file  . Intimate partner violence:    Fear of current or ex partner: Not on file    Emotionally abused: Not on file    Physically abused: Not on file    Forced sexual activity: Not on file  Other Topics Concern  . Not on file  Social History Narrative  . Not on file      Review of Systems  All other systems reviewed and are negative.      Objective:   Physical Exam  Constitutional: He appears well-developed.  Non-toxic appearance. He has a sickly appearance. He does not appear ill. No distress.  Neck: Neck supple. No JVD present.  Cardiovascular: Normal rate.  Murmur heard. Pulmonary/Chest: Effort normal. No respiratory distress. He has no wheezes. He has rales.      Abdominal: Soft. Bowel sounds are normal. He exhibits no distension. There is no tenderness. There is no rebound.  Musculoskeletal: He exhibits edema.       Right hand: He exhibits tenderness and swelling.       Hands: Lymphadenopathy:    He has no cervical adenopathy.  Skin: He is not diaphoretic.  Vitals reviewed.                Assessment & Plan:  Anemia, unspecified type - Plan: Fecal Globin By [facility] discharge follow-up  Chronic systolic CHF (congestive heart failure) ([place])  Chest pain in adult  Vitamin B12 deficiency  I believe we need to get the patient see a hand surgeon.  I am concerned that he may have septic arthritis in the right first PIP joint area I will discontinue Keflex and switch the patient to doxycycline to cover for possible MRSA.  Consult hand surgery as soon as possible.  Had a long discussion with the patient's family today.  He is deemed to be a poor candidate for any invasive cardiovascular procedures per the cardiologist consultation in the hospital.  Therefore we are focusing on medical management.  His chest pain has improved on imdur.  However he appears fluid overloaded today on exam.  He is currently taking Lasix 40 mg a day.  I have recommended that he take 60 mg today and 60 mg tomorrow and then resume his previous dose.  I would like to see the patient back in 1 week to recheck him and see how he is doing.  We had a long discussion about his anemia.  I have recommended checking his  stool for blood to determine if he is losing blood from an occult GI bleed in addition to his myelodysplastic syndrome and chronic kidney disease.  However we also discussed whether he would be a candidate for any intervention such as endoscopy or colonoscopy.  We weigh the risk and benefits of periodic transfusion versus evaluating for an occult GI bleed.  Patient will consider this with his wife.  I did give him a stool kit to evaluate for blood in his stool.  He will return this next week if he elects to proceed with further work-up.

## 2018-05-12 ENCOUNTER — Other Ambulatory Visit: Payer: Self-pay | Admitting: Family Medicine

## 2018-05-12 ENCOUNTER — Telehealth: Payer: Self-pay | Admitting: Family Medicine

## 2018-05-12 DIAGNOSIS — M009 Pyogenic arthritis, unspecified: Secondary | ICD-10-CM

## 2018-05-12 NOTE — Telephone Encounter (Signed)
Will get pt into see orthopedics on Monday and informed pt's wife that is finger gets worse or he spikes a fever over 101 to take him to the ER otherwise we would call him on Monday with an appointment. Pt's wife verbalized understanding.

## 2018-05-15 ENCOUNTER — Inpatient Hospital Stay: Payer: Medicare Other | Admitting: Family Medicine

## 2018-05-15 DIAGNOSIS — M79644 Pain in right finger(s): Secondary | ICD-10-CM | POA: Diagnosis not present

## 2018-05-16 ENCOUNTER — Emergency Department (HOSPITAL_COMMUNITY)
Admission: EM | Admit: 2018-05-16 | Discharge: 2018-05-16 | Disposition: A | Discharge status: Home / Self Care | Payer: Medicare Other | Attending: Emergency Medicine | Admitting: Emergency Medicine

## 2018-05-16 ENCOUNTER — Other Ambulatory Visit: Payer: Self-pay

## 2018-05-16 ENCOUNTER — Encounter (HOSPITAL_COMMUNITY): Payer: Self-pay | Admitting: Emergency Medicine

## 2018-05-16 DIAGNOSIS — I5022 Chronic systolic (congestive) heart failure: Secondary | ICD-10-CM | POA: Insufficient documentation

## 2018-05-16 DIAGNOSIS — L03011 Cellulitis of right finger: Secondary | ICD-10-CM | POA: Insufficient documentation

## 2018-05-16 DIAGNOSIS — Z794 Long term (current) use of insulin: Secondary | ICD-10-CM | POA: Diagnosis not present

## 2018-05-16 DIAGNOSIS — E1122 Type 2 diabetes mellitus with diabetic chronic kidney disease: Secondary | ICD-10-CM | POA: Diagnosis not present

## 2018-05-16 DIAGNOSIS — Z79899 Other long term (current) drug therapy: Secondary | ICD-10-CM | POA: Diagnosis not present

## 2018-05-16 DIAGNOSIS — N184 Chronic kidney disease, stage 4 (severe): Secondary | ICD-10-CM | POA: Diagnosis not present

## 2018-05-16 DIAGNOSIS — Z87891 Personal history of nicotine dependence: Secondary | ICD-10-CM | POA: Diagnosis not present

## 2018-05-16 DIAGNOSIS — Z7901 Long term (current) use of anticoagulants: Secondary | ICD-10-CM | POA: Insufficient documentation

## 2018-05-16 DIAGNOSIS — I130 Hypertensive heart and chronic kidney disease with heart failure and stage 1 through stage 4 chronic kidney disease, or unspecified chronic kidney disease: Secondary | ICD-10-CM | POA: Insufficient documentation

## 2018-05-16 DIAGNOSIS — R2231 Localized swelling, mass and lump, right upper limb: Secondary | ICD-10-CM | POA: Diagnosis present

## 2018-05-16 LAB — CBC WITH DIFFERENTIAL/PLATELET
ABS IMMATURE GRANULOCYTES: 0.13 10*3/uL — AB (ref 0.00–0.07)
BASOS PCT: 0 %
Basophils Absolute: 0 10*3/uL (ref 0.0–0.1)
Eosinophils Absolute: 0 10*3/uL (ref 0.0–0.5)
Eosinophils Relative: 0 %
HEMATOCRIT: 27.1 % — AB (ref 39.0–52.0)
Hemoglobin: 7.9 g/dL — ABNORMAL LOW (ref 13.0–17.0)
IMMATURE GRANULOCYTES: 5 %
Lymphocytes Relative: 67 %
Lymphs Abs: 1.9 10*3/uL (ref 0.7–4.0)
MCH: 24.8 pg — ABNORMAL LOW (ref 26.0–34.0)
MCHC: 29.2 g/dL — AB (ref 30.0–36.0)
MCV: 85.2 fL (ref 80.0–100.0)
MONO ABS: 0.4 10*3/uL (ref 0.1–1.0)
MONOS PCT: 15 %
NEUTROS ABS: 0.4 10*3/uL — AB (ref 1.7–7.7)
Neutrophils Relative %: 13 %
Platelets: UNDETERMINED 10*3/uL (ref 150–400)
RBC: 3.18 MIL/uL — ABNORMAL LOW (ref 4.22–5.81)
RDW: 23.7 % — ABNORMAL HIGH (ref 11.5–15.5)
WBC: 2.8 10*3/uL — ABNORMAL LOW (ref 4.0–10.5)
nRBC: 0 % (ref 0.0–0.2)

## 2018-05-16 LAB — BASIC METABOLIC PANEL
Anion gap: 9 (ref 5–15)
BUN: 42 mg/dL — AB (ref 8–23)
CALCIUM: 8.6 mg/dL — AB (ref 8.9–10.3)
CHLORIDE: 107 mmol/L (ref 98–111)
CO2: 22 mmol/L (ref 22–32)
CREATININE: 2.02 mg/dL — AB (ref 0.61–1.24)
GFR calc non Af Amer: 29 mL/min — ABNORMAL LOW (ref >60)
GFR, EST AFRICAN AMERICAN: 34 mL/min — AB (ref >60)
Glucose, Bld: 189 mg/dL — ABNORMAL HIGH (ref 70–99)
Potassium: 4 mmol/L (ref 3.5–5.1)
SODIUM: 138 mmol/L (ref 135–145)

## 2018-05-16 NOTE — ED Triage Notes (Signed)
Pt. Stated, I came from Dr. Archie Endo office for a rt. Index finger redness and swelling , I fell on Nov. 10, and I guess I fell on it some how, but its gotten red and swollen. Dr. Noemi Chapel wanted me to come here.

## 2018-05-16 NOTE — Discharge Instructions (Addendum)
Lab work appeared to be within normal limits for you.  Please continue to take your doxycycline.  May also be helpful to do warm Epsom salt soaks on your finger.  Please continue to keep the finger covered.  Make sure to wash your finger at least twice a day with warm soapy water.  Return for the reasons listed below.  Contact a health care provider if: Your pain medicine is not helping. You have more redness, swelling, or pain at your fingertip. You continue to have fluid, blood, or pus coming from your fingertip. Your infection area feels warm to the touch. You continue to notice a bad smell coming from your fingertip or your dressing. Get help right away if: The area of redness is spreading, or you notice a red streak going away from your fingertip. You have a fever

## 2018-05-16 NOTE — ED Provider Notes (Signed)
[facility] EMERGENCY DEPARTMENT Provider Note   CSN: [id number] Arrival date & time: 05/16/18  1719     History   Chief Complaint Chief Complaint  Patient presents with  . Wound Check  . Wound Infection    HPI Ricardo Watkins is a 84 y.o. male who presents emergency department for evaluation of right index finger infection.  Is an extensive past medical history including unstable angina and diabetes.  Patient fell and hit his finger against a rock on 10 November.  He came into the hospital for unstable angina during that time had worsening redness of his finger.  He was treated but with antibiotics.  Patient states it continued to worsen he went to his primary care physician and was started on doxycycline.  He is currently still taking the medication, he followed up with Dr. Noemi Chapel at [facility] orthopedic who got him an appointment next week with the [facility] however wanted him to coming to the emergency department for evaluation.  Patient states that yesterday when he took the Band-Aid off the scab peeled off the top of it and a large amount of purulent drainage came out of his finger.  His daughter who is at bedside states that his finger is looking greatly improved since yesterday.  He is able to move that finger.  He still has some swelling and stiffness.  He denies fevers or chills.  HPI  Past Medical History:  Diagnosis Date  . Adenomatous colon polyp   . CAD (coronary artery disease)    30% LAD Stenosis, 70% ramus intermedius stenosis, treated with PTCA and angioplasty by Dr Albertine Patricia 2004  . Cataract    right eye  . CHF (congestive heart failure) ([place])   . Chronic ITP (idiopathic thrombocytopenia) (HCC)   . Cough, persistent 11/04/2015  . Diverticulosis   . DM (diabetes mellitus) ([place])   . DVT (deep venous thrombosis) ([place])    secondary to surgery  . Dyspnea   . Fever 05/01/2018  . Hyperlipidemia   . Hypertension   . Inguinal hernia     right  . Laceration of finger of right hand 05/01/2018   INDEX FINGER  . 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 Microcytic anemia 07/07/2015  . 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 Non Hodgkin's lymphoma ([place])   . Pleural effusion, left 11/04/2015  . Prostate CA ([place]) 09/10/2011   Gleason 3+3 R, 3+4 L lobe May 2007 Rx Radioactive seed implants Dr [name]  . PVD (peripheral vascular disease) ([place])    rt renal artery stent  . Renal insufficiency   . Thrombocytopenia ([place])   . 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 Thyroid nodule    left lower lobe (annual monitoring).  . Vitamin D deficiency     Patient Active Problem List   Diagnosis Date Noted  . Fever 05/01/2018  . Unstable angina ([place])   . Chronic systolic CHF (congestive heart failure) ([place])   . Acute on chronic combined systolic and diastolic CHF (congestive heart failure) ([place]) 04/22/2018  . Nonrheumatic aortic valve stenosis   . Chest pain in adult 04/20/2018  . Chronic neutropenia ([place]) 03/02/2018  . CHF (congestive heart failure) ([place]) 02/19/2018  . Upper airway cough syndrome 10/27/2017  . Abnormal CT of the chest 07/27/2017  .  Flu-like symptoms 06/02/2017  . Elevated lactic acid level 06/02/2017  . Acute kidney injury superimposed on chronic kidney disease ([place]) 06/02/2017  . CKD (chronic kidney disease), stage IV ([place]) 12/16/2016  . Bilateral carotid artery stenosis 12/11/2016  . Dyslipidemia 12/11/2016  . Anemia in chronic kidney disease 07/01/2016  . Chronic ITP (idiopathic thrombocytopenia) (HCC) 07/01/2016  . B12 deficiency anemia   . Chest tube in place   . Acute diastolic CHF (congestive heart failure) ([place]) 04/16/2016  . Acute respiratory failure with hypoxia ([place]) 04/14/2016  . Empyema lung ([place])   . Type 2 diabetes mellitus ([place]) 04/04/2016  .  Empyema ([place]) 04/03/2016  . Thyroid nodule 03/03/2016  . Sepsis ([place]) 02/17/2016  . DOE (dyspnea on exertion) 01/27/2016  . Pulmonary edema with congestive heart failure ([place]) 11/12/2015  . Cough, persistent 11/04/2015  . Pleural effusion, left 11/04/2015  . Anorexia 08/08/2015  . Microcytic anemia 07/07/2015  . Pancytopenia ([place]) 04/08/2015  . Diarrhea 04/08/2015  . Encounter for chemotherapy management 04/02/2015  . Neutropenia ([place]) 02/18/2015  . 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 Deficiency anemia 04/19/2014  . Thrombocytopenia ([place]) 04/19/2014  . Vitamin B12 deficiency 04/19/2014  . Chronic renal failure, stage 3 (moderate) ([place]) 04/19/2014  . Macrocytic anemia 03/20/2013  . Monocytosis 03/20/2013  . Thrombotic stroke ([place]) 09/10/2011  . Prostate CA ([place]) 09/10/2011  . Inguinal hernia   . Colon polyps   . CAD (coronary artery disease) 08/26/2010  . Murmur 08/26/2010  . MURMUR 08/26/2010  . History of lymphoma 08/25/2010  . Diabetes ([place]) 08/25/2010  . THROMBOCYTOPENIA 08/25/2010  . Essential hypertension 08/25/2010  . Coronary atherosclerosis 08/25/2010  . PVD 08/25/2010    Past Surgical History:  Procedure Laterality Date  . APPENDECTOMY     patient ?  Marland Kitchen CARDIAC CATHETERIZATION    . CATARACT EXTRACTION    . CHEST TUBE INSERTION Left 02/10/2016   Procedure: INSERTION PLEURAL DRAINAGE CATHETER;  Surgeon: [name], MD;  Location: [facility];  Service: Thoracic;  Laterality: Left;  . CHEST TUBE INSERTION Left 04/08/2016   Procedure: CHEST TUBE INSERTION;  Surgeon: [name], MD;  Location: [facility];  Service: Thoracic;  Laterality: Left;  . COLONOSCOPY    . CORONARY ANGIOPLASTY    . CYSTOURETHROSCOPY     ROBOTIC ARM NUCLETRON SEED IMPLANTATION OF PROSTATE  . EXPLORATORY LAPAROTOMY     For evaluation of lymphoma  . REMOVAL OF PLEURAL DRAINAGE CATHETER Left 04/08/2016   Procedure: REMOVAL OF PLEURAL DRAINAGE CATHETER;  Surgeon:  [name], MD;  Location: [facility];  Service: Thoracic;  Laterality: Left;        Home Medications    Prior to Admission medications   Medication Sig Start Date End Date Taking? Authorizing Provider  acetaminophen (TYLENOL) 500 MG tablet Take 1,000 mg by mouth every 6 (six) hours as needed for mild pain, moderate pain or headache.    Yes [provider]  aspirin EC 81 MG EC tablet Take 1 tablet (81 mg total) by mouth daily. 05/04/18 06/03/18 Yes Amin, Ankit Chirag, MD  clopidogrel (PLAVIX) 75 MG tablet TAKE 1 TABLET BY MOUTH ONCE A DAY WITH BREAKFAST Patient taking differently: Take 75 mg by mouth daily.  04/10/18  Yes [name], MD  doxycycline (VIBRA-TABS) 100 MG tablet Take 1 tablet (100 mg total) by mouth 2 (two) times daily. 05/11/18  Yes [name], MD  furosemide (LASIX) 40 MG tablet Take 60mg  daily by  mouth. (3 tablets) Except excess swelling or weight gain take an additional 40mg  daily (2 tablets) Patient taking differently: Take 40 mg by mouth daily.  04/22/18  Yes Lady Deutscher, MD  gabapentin (NEURONTIN) 100 MG capsule TAKE 1 CAPSULE BY MOUTH AT BEDTIME Patient taking differently: Take 100 mg by mouth at bedtime.  03/20/18  Yes [name], MD  Insulin Glargine (LANTUS SOLOSTAR) 100 UNIT/ML Solostar Pen INJECT 20 UNITS EVERY NIGHT AT BEDTIME Patient taking differently: Inject 20 Units into the skin every morning. INJECT 20 UNITS EVERY NIGHT AT BEDTIME 04/22/18  Yes Lady Deutscher, MD  isosorbide mononitrate (IMDUR) 60 MG 24 hr tablet Take 1 tablet (60 mg total) by mouth daily. 05/11/18 06/10/18 Yes [name], MD  loperamide (IMODIUM) 2 MG capsule Take 2 mg by mouth as needed for diarrhea or loose stools.   Yes [provider]  meclizine (ANTIVERT) 12.5 MG tablet Take 1 tablet (12.5 mg total) by mouth 3 (three) times daily as needed for dizziness. 06/27/15  Yes [name], MD  metoprolol succinate (TOPROL-XL) 100 MG 24 hr  tablet TAKE 1 TABLET BY MOUTH ONCE DAILY Patient taking differently: Take 100 mg by mouth daily.  12/28/17  Yes [name], MD  Multiple Vitamin (MULTIVITAMIN WITH MINERALS) TABS tablet Take 1 tablet by mouth every morning.    Yes [provider]  nitroGLYCERIN (NITROSTAT) 0.4 MG SL tablet Place 1 tablet (0.4 mg total) under the tongue every 5 (five) minutes as needed for chest pain. 05/03/18 05/03/19 Yes Amin, Jeanella Flattery, MD  OVER THE COUNTER MEDICATION Place 1 drop into both eyes daily as needed (dry eyes). Over the counter lubricating eye drop   Yes [provider]  pravastatin (PRAVACHOL) 40 MG tablet Take 1 tablet (40 mg total) by mouth daily at 6 PM. 05/03/18  Yes Amin, Ankit Chirag, MD  tamsulosin (FLOMAX) 0.4 MG CAPS capsule TAKE 1 CAPSULE BY MOUTH ONCE DAILY Patient taking differently: Take 0.4 mg by mouth daily.  04/11/18  Yes [name], MD  traMADol (ULTRAM) 50 MG tablet Take 1 tablet (50 mg total) by mouth every 6 (six) hours as needed for moderate pain. 01/03/18  Yes [name], MD  NON FORMULARY Inject 1 Dose into the vein once a week. Platelet booster    [provider]    Family History Family History  Problem Relation Age of Onset  . Lymphoma Sister   . Prostate cancer Brother   . Breast cancer Sister   . Diabetes Brother   . Diabetes Sister   . Heart disease Brother   . Asthma Son     Social History Social History   Tobacco Use  . Smoking status: Former Smoker    Packs/day: 0.50    Years: 20.00    Pack years: 10.00    Types: Pipe, Cigarettes    Last attempt to quit: 06/21/1958    Years since quitting: 59.9  . Smokeless tobacco: Never Used  Substance Use Topics  . Alcohol use: No    Alcohol/week: 0.0 standard drinks  . Drug use: No     Allergies   Glucophage [metformin hydrochloride]; Zetia [ezetimibe]; Fenofibrate; and Niacin-lovastatin er   Review of Systems Review of Systems  Ten systems reviewed and  are negative for acute change, except as noted in the HPI.   Physical Exam Updated Vital Signs BP (!) 129/48 (BP Location: Left Arm)   Pulse 76   Temp 97.7  F (36.5 C) (Oral)   Resp 17   Ht 5\' 7"  (1.702 m)   Wt 81.6 kg   SpO2 99%   BMI 28.19 kg/m   Physical Exam  Constitutional: He appears well-developed and well-nourished. No distress.  HENT:  Head: Normocephalic and atraumatic.  Eyes: Conjunctivae are normal. No scleral icterus.  Neck: Normal range of motion. Neck supple.  Cardiovascular: Normal rate, regular rhythm and normal heart sounds.  Pulmonary/Chest: Effort normal and breath sounds normal. No respiratory distress.  Abdominal: Soft. There is no tenderness.  Musculoskeletal: He exhibits no edema.  Neurological: He is alert.  Skin: Skin is warm and dry. He is not diaphoretic.  Psychiatric: His behavior is normal.  Nursing note and vitals reviewed.            ED Treatments / Results  Labs (all labs ordered are listed, but only abnormal results are displayed) Labs Reviewed  BASIC METABOLIC PANEL  CBC WITH DIFFERENTIAL/PLATELET    EKG None  Radiology No results found.  Procedures Procedures (including critical care time)  Medications Ordered in ED Medications - No data to display   Initial Impression / Assessment and Plan / ED Course  I have reviewed the triage vital signs and the nursing notes.  Pertinent labs & imaging results that were available during my care of the patient were reviewed by me and considered in my medical decision making (see chart for details).     Patient here with finger infection.  Yesterday the scab removed and there was copious purulent discharge.  He has had significant improvement in the swelling, color, and pain of the finger.  I suspect that he likely had an underlying abscess causing persistent infection which is also the cause of him having 2 rounds of antibiotics.  Patient's pain and swelling is on the dorsal  aspect of the finger.  There is no pain with passive extension suggestive of infectious tenosynovitis.  Do not feel he needs any emergent attention at this time.  Patient has close follow-up with the hand [facility].  I discussed return precautions with the patient and his family.  He appears appropriate for discharge at this time  Final Clinical Impressions(s) / ED Diagnoses   Final diagnoses:  Cellulitis of right index finger    ED Discharge Orders    None       Margarita Mail, PA-C 05/16/18 2214    Lajean Saver, MD 05/17/18 [phone]

## 2018-05-17 ENCOUNTER — Inpatient Hospital Stay: Payer: Medicare Other

## 2018-05-17 ENCOUNTER — Encounter (HOSPITAL_COMMUNITY): Payer: Self-pay

## 2018-05-17 ENCOUNTER — Other Ambulatory Visit: Payer: Self-pay

## 2018-05-17 ENCOUNTER — Emergency Department (HOSPITAL_COMMUNITY)
Admission: EM | Admit: 2018-05-17 | Discharge: 2018-05-17 | Disposition: A | Discharge status: Home / Self Care | Payer: Medicare Other | Source: Home / Self Care | Attending: Emergency Medicine | Admitting: Emergency Medicine

## 2018-05-17 ENCOUNTER — Telehealth: Payer: Self-pay | Admitting: Family Medicine

## 2018-05-17 VITALS — BP 135/65 | HR 72 | Temp 97.9°F | Resp 16

## 2018-05-17 DIAGNOSIS — N183 Chronic kidney disease, stage 3 unspecified: Secondary | ICD-10-CM

## 2018-05-17 DIAGNOSIS — L03011 Cellulitis of right finger: Secondary | ICD-10-CM

## 2018-05-17 DIAGNOSIS — D693 Immune thrombocytopenic purpura: Secondary | ICD-10-CM | POA: Diagnosis not present

## 2018-05-17 DIAGNOSIS — D46C Myelodysplastic syndrome with isolated del(5q) chromosomal abnormality: Secondary | ICD-10-CM

## 2018-05-17 DIAGNOSIS — D539 Nutritional anemia, unspecified: Secondary | ICD-10-CM

## 2018-05-17 DIAGNOSIS — J9601 Acute respiratory failure with hypoxia: Secondary | ICD-10-CM

## 2018-05-17 DIAGNOSIS — D696 Thrombocytopenia, unspecified: Secondary | ICD-10-CM

## 2018-05-17 DIAGNOSIS — D631 Anemia in chronic kidney disease: Secondary | ICD-10-CM

## 2018-05-17 LAB — CBC WITH DIFFERENTIAL/PLATELET
ABS IMMATURE GRANULOCYTES: 0.02 10*3/uL (ref 0.00–0.07)
Basophils Absolute: 0 10*3/uL (ref 0.0–0.1)
Basophils Relative: 0 %
EOS PCT: 0 %
Eosinophils Absolute: 0 10*3/uL (ref 0.0–0.5)
HCT: 28.1 % — ABNORMAL LOW (ref 39.0–52.0)
HEMOGLOBIN: 8.4 g/dL — AB (ref 13.0–17.0)
Immature Granulocytes: 1 %
LYMPHS ABS: 2.2 10*3/uL (ref 0.7–4.0)
LYMPHS PCT: 67 %
MCH: 25.4 pg — AB (ref 26.0–34.0)
MCHC: 29.9 g/dL — AB (ref 30.0–36.0)
MCV: 84.9 fL (ref 80.0–100.0)
MONO ABS: 0.5 10*3/uL (ref 0.1–1.0)
Monocytes Relative: 15 %
NEUTROS ABS: 0.5 10*3/uL — AB (ref 1.7–7.7)
Neutrophils Relative %: 17 %
Platelets: 122 10*3/uL — ABNORMAL LOW (ref 150–400)
RBC: 3.31 MIL/uL — ABNORMAL LOW (ref 4.22–5.81)
RDW: 24.2 % — ABNORMAL HIGH (ref 11.5–15.5)
WBC: 3.2 10*3/uL — ABNORMAL LOW (ref 4.0–10.5)
nRBC: 0 % (ref 0.0–0.2)

## 2018-05-17 MED ORDER — ROMIPLOSTIM INJECTION 500 MCG
790.0000 ug | SUBCUTANEOUS | Status: DC
  Administered 2018-05-17: 790 ug via SUBCUTANEOUS
  Filled 2018-05-17: qty 1.58

## 2018-05-17 NOTE — Discharge Instructions (Signed)
Continue what you are doing, return for worsening redness, fever.

## 2018-05-17 NOTE — ED Triage Notes (Signed)
Pt sent here to see hand surgeon. PCP contacted hand surgery. Righter pointer is infected and needs to be seen.

## 2018-05-17 NOTE — ED Provider Notes (Signed)
[facility] DE[facility] Provider Note   CSN: [id number] Arrival date & time: 05/17/18  1707     History   Chief Complaint Chief Complaint  Patient presents with  . Finger Injury    HPI Ricardo Watkins is a 84 y.o. male.  84 yo M with a chief complaint of cellulitis to the right second digit.  He had a break in the skin during a fall while he was hospitalized.  And ended up getting infected while he was in the hospital and since then he has followed up with his PCP.  Had been started on doxycycline about a week ago with some improvement.  Was seen in the ED yesterday and was sent home to follow-up as an outpatient.  He saw his family doctor today who contacted the hand center who felt that this may need operative management and sent him to the ED for evaluation.  Family denies fevers and chills.  They think maybe it has ruptured and has had some drainage and they think it has improved slightly.  The history is provided by the patient.  Illness  This is a new problem. The current episode started more than 1 week ago. The problem occurs constantly. The problem has been gradually improving. Pertinent negatives include no chest pain, no abdominal pain, no headaches and no shortness of breath. The symptoms are aggravated by bending. Nothing relieves the symptoms. He has tried nothing for the symptoms. The treatment provided no relief.    Past Medical History:  Diagnosis Date  . Adenomatous colon polyp   . CAD (coronary artery disease)    30% LAD Stenosis, 70% ramus intermedius stenosis, treated with PTCA and angioplasty by Dr Albertine Patricia 2004  . Cataract    right eye  . CHF (congestive heart failure) ([place])   . Chronic ITP (idiopathic thrombocytopenia) (HCC)   . Cough, persistent 11/04/2015  . Diverticulosis   . DM (diabetes mellitus) ([place])   . DVT (deep venous thrombosis) ([place])    secondary to surgery  . Dyspnea   . Fever 05/01/2018  . Hyperlipidemia   .  Hypertension   . Inguinal hernia    right  . Laceration of finger of right hand 05/01/2018   INDEX FINGER  . 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 Microcytic anemia 07/07/2015  . 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 Non Hodgkin's lymphoma ([place])   . Pleural effusion, left 11/04/2015  . Prostate CA ([place]) 09/10/2011   Gleason 3+3 R, 3+4 L lobe May 2007 Rx Radioactive seed implants Dr [name]  . PVD (peripheral vascular disease) ([place])    rt renal artery stent  . Renal insufficiency   . Thrombocytopenia ([place])   . 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 Thyroid nodule    left lower lobe (annual monitoring).  . Vitamin D deficiency     Patient Active Problem List   Diagnosis Date Noted  . Fever 05/01/2018  . Unstable angina ([place])   . Chronic systolic CHF (congestive heart failure) ([place])   . Acute on chronic combined systolic and diastolic CHF (congestive heart failure) ([place]) 04/22/2018  . Nonrheumatic aortic valve stenosis   . Chest pain in adult 04/20/2018  . Chronic neutropenia ([place]) 03/02/2018  . CHF (congestive heart failure) ([place]) 02/19/2018  . Upper airway cough  syndrome 10/27/2017  . Abnormal CT of the chest 07/27/2017  . Flu-like symptoms 06/02/2017  . Elevated lactic acid level 06/02/2017  . Acute kidney injury superimposed on chronic kidney disease ([place]) 06/02/2017  . CKD (chronic kidney disease), stage IV ([place]) 12/16/2016  . Bilateral carotid artery stenosis 12/11/2016  . Dyslipidemia 12/11/2016  . Anemia in chronic kidney disease 07/01/2016  . Chronic ITP (idiopathic thrombocytopenia) (H[facility]) 07/01/2016  . B12 deficiency anemia   . Chest tube in place   . Acute diastolic CHF (congestive heart failure) ([place]) 04/16/2016  . Acute respiratory failure with hypoxia ([place]) 04/14/2016  . Empyema lung ([place])   . Type 2  diabetes mellitus ([place]) 04/04/2016  . Empyema ([place]) 04/03/2016  . Thyroid nodule 03/03/2016  . Sepsis ([place]) 02/17/2016  . DOE (dyspnea on exertion) 01/27/2016  . Pulmonary edema with congestive heart failure ([place]) 11/12/2015  . Cough, persistent 11/04/2015  . Pleural effusion, left 11/04/2015  . Anorexia 08/08/2015  . Microcytic anemia 07/07/2015  . Pancytopenia ([place]) 04/08/2015  . Diarrhea 04/08/2015  . Encounter for chemotherapy management 04/02/2015  . Neutropenia ([place]) 02/18/2015  . 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 Deficiency anemia 04/19/2014  . Thrombocytopenia ([place]) 04/19/2014  . Vitamin B12 deficiency 04/19/2014  . Chronic renal failure, stage 3 (moderate) ([place]) 04/19/2014  . Macrocytic anemia 03/20/2013  . Monocytosis 03/20/2013  . Thrombotic stroke ([place]) 09/10/2011  . Prostate CA ([place]) 09/10/2011  . Inguinal hernia   . Colon polyps   . CAD (coronary artery disease) 08/26/2010  . Murmur 08/26/2010  . MURMUR 08/26/2010  . History of lymphoma 08/25/2010  . Diabetes ([place]) 08/25/2010  . THROMBOCYTOPENIA 08/25/2010  . Essential hypertension 08/25/2010  . Coronary atherosclerosis 08/25/2010  . PVD 08/25/2010    Past Surgical History:  Procedure Laterality Date  . APPENDECTOMY     patient ?  Marland Kitchen CARDIAC CATHETERIZATION    . CATARACT EXTRACTION    . CHEST TUBE INSERTION Left 02/10/2016   Procedure: INSERTION PLEURAL DRAINAGE CATHETER;  Surgeon: [name], MD;  Location: [facility];  Service: Thoracic;  Laterality: Left;  . CHEST TUBE INSERTION Left 04/08/2016   Procedure: CHEST TUBE INSERTION;  Surgeon: [name], MD;  Location: [facility];  Service: Thoracic;  Laterality: Left;  . COLONOSCOPY    . CORONARY ANGIOPLASTY    . CYSTOURETHROSCOPY     ROBOTIC ARM NUCLETRON SEED IMPLANTATION OF PROSTATE  . EXPLORATORY LAPAROTOMY     For evaluation of lymphoma  . REMOVAL OF PLEURAL DRAINAGE CATHETER Left 04/08/2016   Procedure: REMOVAL  OF PLEURAL DRAINAGE CATHETER;  Surgeon: [name], MD;  Location: [facility];  Service: Thoracic;  Laterality: Left;        Home Medications    Prior to Admission medications   Medication Sig Start Date End Date Taking? Authorizing Provider  acetaminophen (TYLENOL) 500 MG tablet Take 1,000 mg by mouth every 6 (six) hours as needed for mild pain, moderate pain or headache.     [provider]  aspirin EC 81 MG EC tablet Take 1 tablet (81 mg total) by mouth daily. 05/04/18 06/03/18  Amin, Jeanella Flattery, MD  clopidogrel (PLAVIX) 75 MG tablet TAKE 1 TABLET BY MOUTH ONCE A DAY WITH BREAKFAST Patient taking differently: Take 75 mg by mouth daily.  04/10/18   [name], MD  doxycycline (VIBRA-TABS) 100 MG tablet Take 1 tablet (100 mg total) by mouth 2 (two) times daily. 05/11/18   Jenna Luo  T, MD  furosemide (LASIX) 40 MG tablet Take 60mg  daily by mouth. (3 tablets) Except excess swelling or weight gain take an additional 40mg  daily (2 tablets) Patient taking differently: Take 40 mg by mouth daily.  04/22/18   Lady Deutscher, MD  gabapentin (NEURONTIN) 100 MG capsule TAKE 1 CAPSULE BY MOUTH AT BEDTIME Patient taking differently: Take 100 mg by mouth at bedtime.  03/20/18   [name], MD  Insulin Glargine (LANTUS SOLOSTAR) 100 UNIT/ML Solostar Pen INJECT 20 UNITS EVERY NIGHT AT BEDTIME Patient taking differently: Inject 20 Units into the skin every morning. INJECT 20 UNITS EVERY NIGHT AT BEDTIME 04/22/18   Lady Deutscher, MD  isosorbide mononitrate (IMDUR) 60 MG 24 hr tablet Take 1 tablet (60 mg total) by mouth daily. 05/11/18 06/10/18  [name], MD  loperamide (IMODIUM) 2 MG capsule Take 2 mg by mouth as needed for diarrhea or loose stools.    [provider]  meclizine (ANTIVERT) 12.5 MG tablet Take 1 tablet (12.5 mg total) by mouth 3 (three) times daily as needed for dizziness. 06/27/15   [name], MD  metoprolol succinate (TOPROL-XL)  100 MG 24 hr tablet TAKE 1 TABLET BY MOUTH ONCE DAILY Patient taking differently: Take 100 mg by mouth daily.  12/28/17   [name], MD  Multiple Vitamin (MULTIVITAMIN WITH MINERALS) TABS tablet Take 1 tablet by mouth every morning.     [provider]  nitroGLYCERIN (NITROSTAT) 0.4 MG SL tablet Place 1 tablet (0.4 mg total) under the tongue every 5 (five) minutes as needed for chest pain. 05/03/18 05/03/19  Amin, Jeanella Flattery, MD  NON FORMULARY Inject 1 Dose into the vein once a week. Platelet booster    [provider]  OVER THE COUNTER MEDICATION Place 1 drop into both eyes daily as needed (dry eyes). Over the counter lubricating eye drop    [provider]  pravastatin (PRAVACHOL) 40 MG tablet Take 1 tablet (40 mg total) by mouth daily at 6 PM. 05/03/18   Amin, Jeanella Flattery, MD  tamsulosin (FLOMAX) 0.4 MG CAPS capsule TAKE 1 CAPSULE BY MOUTH ONCE DAILY Patient taking differently: Take 0.4 mg by mouth daily.  04/11/18   [name], MD  traMADol (ULTRAM) 50 MG tablet Take 1 tablet (50 mg total) by mouth every 6 (six) hours as needed for moderate pain. 01/03/18   [name], MD    Family History Family History  Problem Relation Age of Onset  . Lymphoma Sister   . Prostate cancer Brother   . Breast cancer Sister   . Diabetes Brother   . Diabetes Sister   . Heart disease Brother   . Asthma Son     Social History Social History   Tobacco Use  . Smoking status: Former Smoker    Packs/day: 0.50    Years: 20.00    Pack years: 10.00    Types: Pipe, Cigarettes    Last attempt to quit: 06/21/1958    Years since quitting: 59.9  . Smokeless tobacco: Never Used  Substance Use Topics  . Alcohol use: No    Alcohol/week: 0.0 standard drinks  . Drug use: No     Allergies   Glucophage [metformin hydrochloride]; Zetia [ezetimibe]; Fenofibrate; and Niacin-lovastatin er   Review of Systems Review of Systems  Constitutional: Negative for  chills and fever.  HENT: Negative for congestion and facial swelling.   Eyes: Negative for discharge and visual disturbance.  Respiratory:  Negative for shortness of breath.   Cardiovascular: Negative for chest pain and palpitations.  Gastrointestinal: Negative for abdominal pain, diarrhea and vomiting.  Musculoskeletal: Positive for arthralgias. Negative for myalgias.  Skin: Negative for color change and rash.  Neurological: Negative for tremors, syncope and headaches.  Psychiatric/Behavioral: Negative for confusion and dysphoric mood.     Physical Exam Updated Vital Signs BP 129/63 (BP Location: Right Arm)   Pulse 71   Temp (!) 97.5 F (36.4 C) (Oral)   Resp 16   Ht 5\' 7"  (1.702 m)   Wt 81.6 kg   SpO2 99%   BMI 28.18 kg/m   Physical Exam  Constitutional: He is oriented to person, place, and time. He appears well-developed and well-nourished.  HENT:  Head: Normocephalic and atraumatic.  Eyes: Pupils are equal, round, and reactive to light. EOM are normal.  Neck: Normal range of motion. Neck supple. No JVD present.  Cardiovascular: Normal rate and regular rhythm. Exam reveals no gallop and no friction rub.  No murmur heard. Pulmonary/Chest: No respiratory distress. He has no wheezes.  Abdominal: He exhibits no distension. There is no rebound and no guarding.  Musculoskeletal: Normal range of motion.  Neurological: He is alert and oriented to person, place, and time.  Skin: No rash noted. No pallor.  Erythema to the dorsal aspect of the right second digit.  Small ulceration at the center.  No appreciable drainage fluctuance.  Small amount of edema.  Patient will not flex it, keeps it in extension.  Psychiatric: He has a normal mood and affect. His behavior is normal.  Nursing note and vitals reviewed.    ED Treatments / Results  Labs (all labs ordered are listed, but only abnormal results are displayed) Labs Reviewed - No data to display  EKG None  Radiology No  results found.  Procedures Procedures (including critical care time)  Medications Ordered in ED Medications - No data to display   Initial Impression / Assessment and Plan / ED Course  I have reviewed the triage vital signs and the nursing notes.  Pertinent labs & imaging results that were available during my care of the patient were reviewed by me and considered in my medical decision making (see chart for details).     84 yo M with a chief complaint of a infection to his right second finger.  Was seen by his family doctor today who communicated with an orthopedic surgery office and was sent here for hand surgery evaluation.  I discussed case with Dr. Apolonio Schneiders, hand surgery.  Based on my presentation of the patient's physical exam and the fact that the family feels that the infection has significantly improved he recommends splinting in place and following up with him in the office on Monday.  We will have him continue his current antibiotic regimen.  6:09 PM:  I have discussed the diagnosis/risks/treatment options with the patient and family and believe the pt to be eligible for discharge home to follow-up with Hand surgery. We also discussed returning to the ED immediately if new or worsening sx occur. We discussed the sx which are most concerning (e.g., sudden worsening pain, fever, inability to tolerate by mouth) that necessitate immediate return. Medications administered to the patient during their visit and any new prescriptions provided to the patient are listed below.  Medications given during this visit Medications - No data to display    The patient appears reasonably screen and/or stabilized for discharge and I doubt any other medical  condition or other [facility] requiring further screening, evaluation, or treatment in the ED at this time prior to discharge.   Final Clinical Impressions(s) / ED Diagnoses   Final diagnoses:  Cellulitis of finger of right hand    ED Discharge  Orders    None       Deno Etienne, DO 05/17/18 1809

## 2018-05-17 NOTE — Telephone Encounter (Signed)
Received a phone call from [facility] stating that their providers can not see patient they feel that patient needs surgical intervention and they have no availability today was told to contact Dr. Lequita Asal office as he is the Copy on call at [facility]. Called [facility] and they left a message for Dr. Apolonio Schneiders to call me back. I spoke directly with Dr. Apolonio Schneiders and was told to send the patient through the ER. I have left message for patient to inform him to go to the ER.

## 2018-05-23 DIAGNOSIS — M79644 Pain in right finger(s): Secondary | ICD-10-CM | POA: Diagnosis not present

## 2018-05-24 ENCOUNTER — Inpatient Hospital Stay: Payer: Medicare Other

## 2018-05-24 ENCOUNTER — Inpatient Hospital Stay: Payer: Medicare Other | Attending: Hematology and Oncology

## 2018-05-24 VITALS — BP 117/46

## 2018-05-24 DIAGNOSIS — D539 Nutritional anemia, unspecified: Secondary | ICD-10-CM

## 2018-05-24 DIAGNOSIS — J9601 Acute respiratory failure with hypoxia: Secondary | ICD-10-CM

## 2018-05-24 DIAGNOSIS — D46C Myelodysplastic syndrome with isolated del(5q) chromosomal abnormality: Secondary | ICD-10-CM | POA: Diagnosis not present

## 2018-05-24 DIAGNOSIS — Z8546 Personal history of malignant neoplasm of prostate: Secondary | ICD-10-CM | POA: Diagnosis not present

## 2018-05-24 DIAGNOSIS — D696 Thrombocytopenia, unspecified: Secondary | ICD-10-CM | POA: Insufficient documentation

## 2018-05-24 DIAGNOSIS — N183 Chronic kidney disease, stage 3 unspecified: Secondary | ICD-10-CM

## 2018-05-24 DIAGNOSIS — D693 Immune thrombocytopenic purpura: Secondary | ICD-10-CM | POA: Diagnosis not present

## 2018-05-24 DIAGNOSIS — D631 Anemia in chronic kidney disease: Secondary | ICD-10-CM

## 2018-05-24 LAB — CBC WITH DIFFERENTIAL/PLATELET
ABS IMMATURE GRANULOCYTES: 0.01 10*3/uL (ref 0.00–0.07)
Basophils Absolute: 0 10*3/uL (ref 0.0–0.1)
Basophils Relative: 0 %
Eosinophils Absolute: 0 10*3/uL (ref 0.0–0.5)
Eosinophils Relative: 0 %
HCT: 24.7 % — ABNORMAL LOW (ref 39.0–52.0)
Hemoglobin: 7.3 g/dL — ABNORMAL LOW (ref 13.0–17.0)
IMMATURE GRANULOCYTES: 0 %
LYMPHS ABS: 1.7 10*3/uL (ref 0.7–4.0)
Lymphocytes Relative: 63 %
MCH: 25.5 pg — ABNORMAL LOW (ref 26.0–34.0)
MCHC: 29.6 g/dL — ABNORMAL LOW (ref 30.0–36.0)
MCV: 86.4 fL (ref 80.0–100.0)
MONO ABS: 0.5 10*3/uL (ref 0.1–1.0)
Monocytes Relative: 17 %
NEUTROS ABS: 0.5 10*3/uL — AB (ref 1.7–7.7)
Neutrophils Relative %: 20 %
Platelets: 126 10*3/uL — ABNORMAL LOW (ref 150–400)
RBC: 2.86 MIL/uL — ABNORMAL LOW (ref 4.22–5.81)
RDW: 23.5 % — ABNORMAL HIGH (ref 11.5–15.5)
WBC Morphology: 3
WBC: 2.7 10*3/uL — ABNORMAL LOW (ref 4.0–10.5)
nRBC: 0.7 % — ABNORMAL HIGH (ref 0.0–0.2)

## 2018-05-24 MED ORDER — ROMIPLOSTIM INJECTION 500 MCG
790.0000 ug | SUBCUTANEOUS | Status: DC
  Administered 2018-05-24: 790 ug via SUBCUTANEOUS
  Filled 2018-05-24: qty 1.58

## 2018-05-24 MED ORDER — DARBEPOETIN ALFA 200 MCG/0.4ML IJ SOSY
PREFILLED_SYRINGE | INTRAMUSCULAR | Status: AC
  Filled 2018-05-24: qty 0.4

## 2018-05-24 MED ORDER — DARBEPOETIN ALFA 200 MCG/0.4ML IJ SOSY
200.0000 ug | PREFILLED_SYRINGE | Freq: Once | INTRAMUSCULAR | Status: AC
  Administered 2018-05-24: 200 ug via SUBCUTANEOUS

## 2018-05-25 ENCOUNTER — Other Ambulatory Visit: Payer: Self-pay | Admitting: Hematology and Oncology

## 2018-05-25 ENCOUNTER — Ambulatory Visit (INDEPENDENT_AMBULATORY_CARE_PROVIDER_SITE_OTHER): Payer: Medicare Other | Admitting: Family Medicine

## 2018-05-25 ENCOUNTER — Encounter: Payer: Self-pay | Admitting: Family Medicine

## 2018-05-25 VITALS — BP 110/64 | HR 72 | Temp 97.5°F | Resp 18 | Ht 69.0 in | Wt 184.0 lb

## 2018-05-25 DIAGNOSIS — M009 Pyogenic arthritis, unspecified: Secondary | ICD-10-CM

## 2018-05-25 DIAGNOSIS — D649 Anemia, unspecified: Secondary | ICD-10-CM

## 2018-05-25 DIAGNOSIS — I6522 Occlusion and stenosis of left carotid artery: Secondary | ICD-10-CM | POA: Diagnosis not present

## 2018-05-25 DIAGNOSIS — C61 Malignant neoplasm of prostate: Secondary | ICD-10-CM

## 2018-05-25 DIAGNOSIS — I5022 Chronic systolic (congestive) heart failure: Secondary | ICD-10-CM

## 2018-05-25 DIAGNOSIS — N184 Chronic kidney disease, stage 4 (severe): Secondary | ICD-10-CM | POA: Diagnosis not present

## 2018-05-25 DIAGNOSIS — D46C Myelodysplastic syndrome with isolated del(5q) chromosomal abnormality: Secondary | ICD-10-CM

## 2018-05-25 NOTE — Progress Notes (Signed)
Subjective:    Patient ID: Ricardo Watkins, male    DOB: 1931/02/09, 84 y.o.   MRN: [id number] Patient was recently admitted to the hospital with chest pain.  He has been admitted twice since I last saw him.  I have copied relevant portions of the discharge summary below and included them for my reference: Admit date: 05/01/2018 Discharge date: 05/03/2018  Time spent: 40 minutes  Recommendations for Outpatient Follow-up:  1. Follow up with primary cardiologist 2-3 weeks for evaluation of chest pain  2. Take medications as directed 3. Follow up with Heme/onc for CBC to track Hg   Discharge Diagnoses:  Principal Problem:   Unstable angina ([place]) Active Problems:   Essential hypertension   MDS (myelodysplastic syndrome) with 5q deletion (HCC)   Type 2 diabetes mellitus (HCC)   Anemia in chronic kidney disease   Dyslipidemia   CKD (chronic kidney disease), stage IV (HCC)   Chronic neutropenia (HCC)   Chronic systolic CHF (congestive heart failure) ([place])   Fever   Discharge Condition: stable pain free  Diet recommendation: carb modified heart healthy     Filed Weights   05/01/18 0556  Weight: 82.1 kg    History of present illness:  Ricardo Watkins an 84 y.o.malewith medical history significant ofPVD s/p R renal artery stent; remote prostate CA s/p seed radiation; NHL; HTN; HLD; DM; chronic ITP; CHF; and CAD s/p stent presenting with chest pain.He was hospitalized last week for chest pain. He was doing pretty well. He awoke 05/02/18 about 0400 with tightness in his chest. He was asleep when it started. The pain lasted about an hour. It improved after he took a pain pill, Tramadol. It recurred while in the ER and lasted about 3 hours. He was given more pain medication and it resolved. He never received NTG. Last stress test was 2012. No h/o CAD requiring stent. "He's not a candidate for heart cath - his heart's too weak and he's a bleeder, [facility]  [facility] Course:   Unstable angina -Patienthospitalized last week with chest pain, plan for dobutamine stress test was considered -Also with moderate to severe AS with low flow noted -troponins slightly elevated but flat, ekg with lat ST depression and LBBB.  -imdur started day prior to admission at 30 mg (patient never got) and increased to 60 mg.evaluated by cardiology who opined poor candidate for any invasive procedures and recommended conservative management.   Chronic combined CHF - remained compensated.  HTN -controlled -Continue Toprol XL  HLD -Start Pravachol  DM -Last A1c was 5.2   Fever -resolved at discharge -max: 102 -recently diagnosed with strep viridans bacteremia and coag negative staph bacteremia and completed a round of doxycycline for 10 days -repeat blood cultures negative thus far -his right index finger appears infected- swollen with broken skin overlying -discharge with keflex   Stage IV CKD with anemia -Hg drifted down to 7.1. -transfused 2 units PRBC's before discharge -recommend OP cbc in 1-2 days  MDS with chronic neutropenia -goes to cancer center every Wednesday  05/11/18 Patient presents today for hospital follow-up.  Since discharge from the hospital, he has had a hemoglobin checked at the cancer center and it was up to 9 after his transfusion.  I reviewed his hemoglobin over the last 6 months.  He typically runs 10-10.5.  However over the last 2 months he is drifted down gradually into the eights and was as low as 7.1 on his previous hospital  admission.  This raises the concern of possibly occult blood loss on top of his chronic kidney disease and myelodysplastic syndrome as a cause of his worsening anemia.  I believe his worsening anemia likely contributed to his angina.  Patient has not had evaluation for an occult GI bleed.  He also continues to have redness and warmth over his right index finger.  Directly over  the PIP joint is an area of induration and pain with decreased range of motion suggesting possible septic arthritis he is unable to bend the finger despite taking Keflex.  Occurred after he suffered a recent cut prior to his hospitalization.  He denies any chest pain.  He has not had any further chest pain since starting Imdur.  AT that time, my plan was: I believe we need to get the patient see a hand surgeon.  I am concerned that he may have septic arthritis in the right first PIP joint area I will discontinue Keflex and switch the patient to doxycycline to cover for possible MRSA.  Consult hand surgery as soon as possible.  Had a long discussion with the patient's family today.  He is deemed to be a poor candidate for any invasive cardiovascular procedures per the cardiologist consultation in the hospital.  Therefore we are focusing on medical management.  His chest pain has improved on imdur.  However he appears fluid overloaded today on exam.  He is currently taking Lasix 40 mg a day.  I have recommended that he take 60 mg today and 60 mg tomorrow and then resume his previous dose.  I would like to see the patient back in 1 week to recheck him and see how he is doing.  We had a long discussion about his anemia.  I have recommended checking his stool for blood to determine if he is losing blood from an occult GI bleed in addition to his myelodysplastic syndrome and chronic kidney disease.  However we also discussed whether he would be a candidate for any intervention such as endoscopy or colonoscopy.  We weigh the risk and benefits of periodic transfusion versus evaluating for an occult GI bleed.  Patient will consider this with his wife.  I did give him a stool kit to evaluate for blood in his stool.  He will return this next week if he elects to proceed with further work-up.  05/25/18 Patient is here today for follow-up.  Patient ultimately saw Dr. Apolonio Schneiders with orthopedics regarding possible septic arthritis  in the right second PIP joint.  The pain and swelling has improved on doxycycline.  He has seen orthopedics and they are rechecking him again on Tuesday of next week.  His hemoglobin when I last saw the patient was up to 9.  However had a drop to 7.3 yesterday.  He received an EPO injection through oncology yesterday and is scheduled to follow-up with them next Thursday to repeat hemoglobin and to follow-up with the oncologist.  He denies any chest pain.  He denies any shortness of breath.  He does have some edema in his legs but he has not taken his morning dose of Lasix yet.  He denies any visible melena or hematochezia.     Past Medical History:  Diagnosis Date  . Adenomatous colon polyp   . CAD (coronary artery disease)    30% LAD Stenosis, 70% ramus intermedius stenosis, treated with PTCA and angioplasty by Dr Albertine Patricia 2004  . Cataract    right eye  . CHF (  congestive heart failure) ([place])   . Chronic ITP (idiopathic thrombocytopenia) (HCC)   . Cough, persistent 11/04/2015  . Diverticulosis   . DM (diabetes mellitus) ([place])   . DVT (deep venous thrombosis) ([place])    secondary to surgery  . Dyspnea   . Fever 05/01/2018  . Hyperlipidemia   . Hypertension   . Inguinal hernia    right  . Laceration of finger of right hand 05/01/2018   INDEX FINGER  . 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 Microcytic anemia 07/07/2015  . 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 Non Hodgkin's lymphoma ([place])   . Pleural effusion, left 11/04/2015  . Prostate CA ([place]) 09/10/2011   Gleason 3+3 R, 3+4 L lobe May 2007 Rx Radioactive seed implants Dr [name]  . PVD (peripheral vascular disease) ([place])    rt renal artery stent  . Renal insufficiency   . Thrombocytopenia ([place])   . Thrombotic stroke ([place]) 09/10/2011   January 18, 2011 infarct genu & post limb R internal capsule - acute; previous lacunar infarcts/extensive white matter dis  . Thyroid nodule    left lower lobe  (annual monitoring).  . Vitamin D deficiency    Past Surgical History:  Procedure Laterality Date  . APPENDECTOMY     patient ?  Marland Kitchen CARDIAC CATHETERIZATION    . CATARACT EXTRACTION    . CHEST TUBE INSERTION Left 02/10/2016   Procedure: INSERTION PLEURAL DRAINAGE CATHETER;  Surgeon: [name], MD;  Location: [facility];  Service: Thoracic;  Laterality: Left;  . CHEST TUBE INSERTION Left 04/08/2016   Procedure: CHEST TUBE INSERTION;  Surgeon: [name], MD;  Location: [facility];  Service: Thoracic;  Laterality: Left;  . COLONOSCOPY    . CORONARY ANGIOPLASTY    . CYSTOURETHROSCOPY     ROBOTIC ARM NUCLETRON SEED IMPLANTATION OF PROSTATE  . EXPLORATORY LAPAROTOMY     For evaluation of lymphoma  . REMOVAL OF PLEURAL DRAINAGE CATHETER Left 04/08/2016   Procedure: REMOVAL OF PLEURAL DRAINAGE CATHETER;  Surgeon: [name], MD;  Location[facility];  Service: Thoracic;  Laterality: Left;   Current Outpatient Medications on File Prior to Visit  Medication Sig Dispense Refill  . acetaminophen (TYLENOL) 500 MG tablet Take 1,000 mg by mouth every 6 (six) hours as needed for mild pain, moderate pain or headache.     Marland Kitchen aspirin EC 81 MG EC tablet Take 1 tablet (81 mg total) by mouth daily. 30 tablet 0  . clopidogrel (PLAVIX) 75 MG tablet TAKE 1 TABLET BY MOUTH ONCE A DAY WITH BREAKFAST (Patient taking differently: Take 75 mg by mouth daily. ) 90 tablet 3  . furosemide (LASIX) 40 MG tablet Take 45m daily by mouth. (3 tablets) Except excess swelling or weight gain take an additional 427mdaily (2 tablets) (Patient taking differently: Take 40 mg by mouth daily. ) 100 tablet 3  . gabapentin (NEURONTIN) 100 MG capsule TAKE 1 CAPSULE BY MOUTH AT BEDTIME (Patient taking differently: Take 100 mg by mouth at bedtime. ) 90 capsule 3  . Insulin Glargine (LANTUS SOLOSTAR) 100 UNIT/ML Solostar Pen INJECT 20 UNITS EVERY NIGHT AT BEDTIME (Patient taking differently: Inject 20 Units into the skin every morning.  INJECT 20 UNITS EVERY NIGHT AT BEDTIME) 5 pen 3  . isosorbide mononitrate (IMDUR) 60 MG 24 hr tablet Take 1 tablet (60 mg total) by mouth daily. 30 tablet 5  . loperamide (IMODIUM) 2 MG capsule  Take 2 mg by mouth as needed for diarrhea or loose stools.    . meclizine (ANTIVERT) 12.5 MG tablet Take 1 tablet (12.5 mg total) by mouth 3 (three) times daily as needed for dizziness. 30 tablet 0  . metoprolol succinate (TOPROL-XL) 100 MG 24 hr tablet TAKE 1 TABLET BY MOUTH ONCE DAILY (Patient taking differently: Take 100 mg by mouth daily. ) 90 tablet 3  . Multiple Vitamin (MULTIVITAMIN WITH MINERALS) TABS tablet Take 1 tablet by mouth every morning.     . nitroGLYCERIN (NITROSTAT) 0.4 MG SL tablet Place 1 tablet (0.4 mg total) under the tongue every 5 (five) minutes as needed for chest pain. 100 tablet 3  . NON FORMULARY Inject 1 Dose into the vein once a week. Platelet booster    . OVER THE COUNTER MEDICATION Place 1 drop into both eyes daily as needed (dry eyes). Over the counter lubricating eye drop    . pravastatin (PRAVACHOL) 40 MG tablet Take 1 tablet (40 mg total) by mouth daily at 6 PM. 30 tablet 0  . tamsulosin (FLOMAX) 0.4 MG CAPS capsule TAKE 1 CAPSULE BY MOUTH ONCE DAILY (Patient taking differently: Take 0.4 mg by mouth daily. ) 90 capsule 3  . traMADol (ULTRAM) 50 MG tablet Take 1 tablet (50 mg total) by mouth every 6 (six) hours as needed for moderate pain. 60 tablet 0   Current Facility-Administered Medications on File Prior to Visit  Medication Dose Route Frequency Provider Last Rate Last Dose  . cyanocobalamin ((VITAMIN B-12)) injection 1,000 mcg  1,000 mcg Subcutaneous Q30 days [name], MD   1,000 mcg at 05/11/18 1223  . romiPLOStim (NPLATE) injection 770 mcg  9 mcg/kg Subcutaneous Weekly Heath Lark, MD   770 mcg at 03/16/18 1549  . romiPLOStim (NPLATE) injection 790 mcg  9 mcg/kg Subcutaneous Weekly Heath Lark, MD   790 mcg at 04/19/18 1556   Allergies  Allergen Reactions   . Glucophage [Metformin Hydrochloride] Other (See Comments)    Chest pain  . Zetia [Ezetimibe] Other (See Comments)    weakness  . Fenofibrate Rash  . Niacin-Lovastatin Er Rash   Social History   Socioeconomic History  . Marital status: Married    Spouse name: Not on file  . Number of children: 4  . Years of education: Not on file  . Highest education level: Not on file  Occupational History  . Occupation: retired  Scientific laboratory technician  . Financial resource strain: Not on file  . Food insecurity:    Worry: Not on file    Inability: Not on file  . Transportation needs:    Medical: Not on file    Non-medical: Not on file  Tobacco Use  . Smoking status: Former Smoker    Packs/day: 0.50    Years: 20.00    Pack years: 10.00    Types: Pipe, Cigarettes    Last attempt to quit: 06/21/1958    Years since quitting: 59.9  . Smokeless tobacco: Never Used  Substance and Sexual Activity  . Alcohol use: No    Alcohol/week: 0.0 standard drinks  . Drug use: No  . Sexual activity: Yes    Partners: Female  Lifestyle  . Physical activity:    Days per week: Not on file    Minutes per session: Not on file  . Stress: Not on file  Relationships  . Social connections:    Talks on phone: Not on file    Gets together: Not on file  Attends religious service: Not on file    Active member of club or organization: Not on file    Attends meetings of clubs or organizations: Not on file    Relationship status: Not on file  . Intimate partner violence:    Fear of current or ex partner: Not on file    Emotionally abused: Not on file    Physically abused: Not on file    Forced sexual activity: Not on file  Other Topics Concern  . Not on file  Social History Narrative  . Not on file      Review of Systems  All other systems reviewed and are negative.      Objective:   Physical Exam  Constitutional: He appears well-developed.  Non-toxic appearance. He has a sickly appearance. He does not  appear ill. No distress.  Neck: Neck supple. No JVD present.  Cardiovascular: Normal rate.  Murmur heard. Pulmonary/Chest: Effort normal. No respiratory distress. He has no wheezes. He has no rales.  Abdominal: Soft. Bowel sounds are normal. He exhibits no distension. There is no tenderness. There is no rebound.  Musculoskeletal: He exhibits edema.       Right hand: He exhibits no tenderness and no swelling.       Hands: Lymphadenopathy:    He has no cervical adenopathy.  Skin: He is not diaphoretic.  Vitals reviewed.               Assessment & Plan:  Anemia, unspecified type  Chronic systolic CHF (congestive heart failure) (HCC)  Pyogenic arthritis of right hand, due to unspecified organism (HCC)  CKD (chronic kidney disease) stage 4, GFR 15-29 ml/min ([place])  Patient appears euvolemic today on exam.  He denies any further angina.  He has been chest pain-free since the addition of imdur.  His biggest pressing issue now however is his anemia.  Anemia could be due to a combination of chronic kidney disease coupled with myelodysplastic syndrome.  However despite receiving EPO injections, his hemoglobin continues to drop and is down to 7.3 yesterday.  I have recommended performing stool test to evaluate for an occult GI bleed.  If positive, the patient and his family would have to determine if they want to pursue an invasive work-up including endoscopy and colonoscopy or if they would want to focus more on comfort care given his advanced age and medical comorbidities.  We had a long discussion regarding this today.  They will bring the stool test back to determine if the patient is also losing blood in addition to his chronic kidney disease and myelodysplastic syndrome.  He will repeat a CBC next week and follow-up with oncologist.  If his hemoglobin drops below 7, I would arrange a transfusion as an outpatient.  Hopefully his hemoglobin will be greater than 7 or even 8 and his stool be  negative for blood.  The second issue is the possible septic arthritis in the right index finger PIP joint.  Clinically is improved although by no means resolved.  He is following up with orthopedist next week.

## 2018-05-27 NOTE — Progress Notes (Deleted)
HPI  The patient presents for followup of his known coronary disease. He has a history of non-Hodgkin's lymphoma.  He has had PleurX catheters secondary to chronic non malignant pleural effusions.   Since I last saw him he has been in the hospital he was admitted in early Nov.   He was admitted with chest pain and he had an elevated troponin.  He was managed medically for this.    He was anaemic and had transfusion.   ***    he has done extremely well.  According to Onc notes he has had a "remarkable" response to therapy.  He is eating well.  The patient denies any new symptoms such as chest discomfort, neck or arm discomfort. There has been no new shortness of breath, PND or orthopnea. There have been no reported palpitations, presyncope or syncope.  Allergies  Allergen Reactions  . Glucophage [Metformin Hydrochloride] Other (See Comments)    Chest pain  . Zetia [Ezetimibe] Other (See Comments)    weakness  . Fenofibrate Rash  . Niacin-Lovastatin Er Rash    Current Outpatient Medications  Medication Sig Dispense Refill  . acetaminophen (TYLENOL) 500 MG tablet Take 1,000 mg by mouth every 6 (six) hours as needed for mild pain, moderate pain or headache.     Marland Kitchen aspirin EC 81 MG EC tablet Take 1 tablet (81 mg total) by mouth daily. 30 tablet 0  . clopidogrel (PLAVIX) 75 MG tablet TAKE 1 TABLET BY MOUTH ONCE A DAY WITH BREAKFAST (Patient taking differently: Take 75 mg by mouth daily. ) 90 tablet 3  . furosemide (LASIX) 40 MG tablet Take 60mg  daily by mouth. (3 tablets) Except excess swelling or weight gain take an additional 40mg  daily (2 tablets) (Patient taking differently: Take 40 mg by mouth daily. ) 100 tablet 3  . gabapentin (NEURONTIN) 100 MG capsule TAKE 1 CAPSULE BY MOUTH AT BEDTIME (Patient taking differently: Take 100 mg by mouth at bedtime. ) 90 capsule 3  . Insulin Glargine (LANTUS SOLOSTAR) 100 UNIT/ML Solostar Pen INJECT 20 UNITS EVERY NIGHT AT BEDTIME (Patient taking  differently: Inject 20 Units into the skin every morning. INJECT 20 UNITS EVERY NIGHT AT BEDTIME) 5 pen 3  . isosorbide mononitrate (IMDUR) 60 MG 24 hr tablet Take 1 tablet (60 mg total) by mouth daily. 30 tablet 5  . loperamide (IMODIUM) 2 MG capsule Take 2 mg by mouth as needed for diarrhea or loose stools.    . meclizine (ANTIVERT) 12.5 MG tablet Take 1 tablet (12.5 mg total) by mouth 3 (three) times daily as needed for dizziness. 30 tablet 0  . metoprolol succinate (TOPROL-XL) 100 MG 24 hr tablet TAKE 1 TABLET BY MOUTH ONCE DAILY (Patient taking differently: Take 100 mg by mouth daily. ) 90 tablet 3  . Multiple Vitamin (MULTIVITAMIN WITH MINERALS) TABS tablet Take 1 tablet by mouth every morning.     . nitroGLYCERIN (NITROSTAT) 0.4 MG SL tablet Place 1 tablet (0.4 mg total) under the tongue every 5 (five) minutes as needed for chest pain. 100 tablet 3  . NON FORMULARY Inject 1 Dose into the vein once a week. Platelet booster    . OVER THE COUNTER MEDICATION Place 1 drop into both eyes daily as needed (dry eyes). Over the counter lubricating eye drop    . pravastatin (PRAVACHOL) 40 MG tablet Take 1 tablet (40 mg total) by mouth daily at 6 PM. 30 tablet 0  . tamsulosin (FLOMAX) 0.4 MG CAPS capsule  TAKE 1 CAPSULE BY MOUTH ONCE DAILY (Patient taking differently: Take 0.4 mg by mouth daily. ) 90 capsule 3  . traMADol (ULTRAM) 50 MG tablet Take 1 tablet (50 mg total) by mouth every 6 (six) hours as needed for moderate pain. 60 tablet 0   Current Facility-Administered Medications  Medication Dose Route Frequency Provider Last Rate Last Dose  . cyanocobalamin ((VITAMIN B-12)) injection 1,000 mcg  1,000 mcg Subcutaneous Q30 days [name], MD   1,000 mcg at 05/11/18 1223   Facility-Administered Medications Ordered in Other Visits  Medication Dose Route Frequency Provider Last Rate Last Dose  . romiPLOStim (NPLATE) injection 770 mcg  9 mcg/kg Subcutaneous Weekly Heath Lark, MD   770 mcg at  03/16/18 1549  . romiPLOStim (NPLATE) injection 790 mcg  9 mcg/kg Subcutaneous Weekly Heath Lark, MD   790 mcg at 04/19/18 1556    Past Medical History:  Diagnosis Date  . Adenomatous colon polyp   . CAD (coronary artery disease)    30% LAD Stenosis, 70% ramus intermedius stenosis, treated with PTCA and angioplasty by Dr Albertine Patricia 2004  . Cataract    right eye  . CHF (congestive heart failure) ([place])   . Chronic ITP (idiopathic thrombocytopenia) ([facility])   . Cough, persistent 11/04/2015  . Diverticulosis   . DM (diabetes mellitus) ([place])   . DVT (deep venous thrombosis) ([place])    secondary to surgery  . Dyspnea   . Fever 05/01/2018  . Hyperlipidemia   . Hypertension   . Inguinal hernia    right  . Laceration of finger of right hand 05/01/2018   INDEX FINGER  . 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 Microcytic anemia 07/07/2015  . 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 Non Hodgkin's lymphoma ([place])   . Pleural effusion, left 11/04/2015  . Prostate CA ([place]) 09/10/2011   Gleason 3+3 R, 3+4 L lobe May 2007 Rx Radioactive seed implants Dr [name]  . PVD (peripheral vascular disease) ([place])    rt renal artery stent  . Renal insufficiency   . Thrombocytopenia ([place])   . Thrombotic stroke ([place]) 09/10/2011   January 18, 2011 infarct genu & post limb R internal capsule - acute; previous lacunar infarcts/extensive white matter dis  . Thyroid nodule    left lower lobe (annual monitoring).  . Vitamin D deficiency     Past Surgical History:  Procedure Laterality Date  . APPENDECTOMY     patient ?  Marland Kitchen CARDIAC CATHETERIZATION    . CATARACT EXTRACTION    . CHEST TUBE INSERTION Left 02/10/2016   Procedure: INSERTION PLEURAL DRAINAGE CATHETER;  Surgeon: [name], MD;  Location: [facility];  Service: Thoracic;  Laterality: Left;  . CHEST TUBE INSERTION Left 04/08/2016   Procedure: CHEST TUBE INSERTION;  Surgeon: [name], MD;  Location: [facility];  Service:  Thoracic;  Laterality: Left;  . COLONOSCOPY    . CORONARY ANGIOPLASTY    . CYSTOURETHROSCOPY     ROBOTIC ARM NUCLETRON SEED IMPLANTATION OF PROSTATE  . EXPLORATORY LAPAROTOMY     For evaluation of lymphoma  . REMOVAL OF PLEURAL DRAINAGE CATHETER Left 04/08/2016   Procedure: REMOVAL OF PLEURAL DRAINAGE CATHETER;  Surgeon: [name], MD;  Location: [facility];  Service: Thoracic;  Laterality: Left;    ROS:  ***  PHYSICAL EXAM There were no vitals taken for this visit.  GENERAL:  Well appearing NECK:  No jugular venous  distention, waveform within normal limits, carotid upstroke brisk and symmetric, no bruits, no thyromegaly LUNGS:  Clear to auscultation bilaterally CHEST:  Unremarkable HEART:  PMI not displaced or sustained,S1 and S2 within normal limits, no S3, no S4, no clicks, no rubs, *** murmurs ABD:  Flat, positive bowel sounds normal in frequency in pitch, no bruits, no rebound, no guarding, no midline pulsatile mass, no hepatomegaly, no splenomegaly EXT:  2 plus pulses throughout, no edema, no cyanosis no clubbing   ***GENERAL:  Well appearing for all that he has been through NECK:  No jugular venous distention, waveform within normal limits, carotid upstroke brisk and symmetric, no bruits, no thyromegaly LUNGS:  Clear to auscultation bilaterally BACK:  No CVA tenderness CHEST:  Unremarkable HEART:  PMI not displaced or sustained,S1 and S2 within normal limits, no S3, no S4, no clicks, no rubs, 2/6 apical systolic murmur without radiation, no diastoic murmurs ABD:  Flat, positive bowel sounds normal in frequency in pitch, no bruits, no rebound, no guarding, no midline pulsatile mass, no hepatomegaly, no splenomegaly EXT:  2 plus pulses throughout, mild left leg edema, no cyanosis no clubbing   Lab Results  Component Value Date   CHOL 99 04/22/2018   TRIG 121 04/22/2018   HDL 24 (L) 04/22/2018   LDLCALC 51 04/22/2018   Lab Results  Component Value Date   CREATININE  2.02 (H) 05/16/2018    ASSESSMENT AND PLAN  CAD:   He had a elevated enzymes during his last admission as reported but secondary to his comorbid conditions he was managed medically.  ***  The patient has no new sypmtoms.  No further cardiovascular testing is indicated.  We will continue with aggressive risk reduction and meds as listed.  CAROTID STENOSIS:  This was stable in Dec with  1-39% RICA stenosis and stable 11-94% LICA stenosis. He needs follow up this month.  ***  He will have follow up in Dec.   CARDIOMYOPATHY:  The EF remains low at 30 - 35%.  ***   HYPERLIPIDMEIA:  Lipids were as above and he will remain on the meds as listed.   ***   I will have him come back for a repeat lipid level.  AS:  The AS was thought to be moderate or severe by the echo in Sept.  However, he is not thought to be a candidate for TAVR or SAVR.  ***  This was mild on echo in October.  I will follow this clinically.  CKD:  Creat was as above.  *** No change in therapy.  EDEMA:    *** This is baseline.  No change in therapy.   PLEURAL EFFUSION:   ***  He was followed by [name] who does not need to see him again as it seems to have resolved.

## 2018-05-28 ENCOUNTER — Other Ambulatory Visit: Payer: Self-pay

## 2018-05-28 ENCOUNTER — Inpatient Hospital Stay (HOSPITAL_COMMUNITY)
Admission: EM | Admit: 2018-05-28 | Discharge: 2018-06-12 | DRG: 871 | Disposition: A | Discharge status: Home Health | Payer: Medicare Other | Attending: Internal Medicine | Admitting: Internal Medicine

## 2018-05-28 ENCOUNTER — Encounter (HOSPITAL_COMMUNITY): Payer: Self-pay | Admitting: Emergency Medicine

## 2018-05-28 ENCOUNTER — Emergency Department (HOSPITAL_COMMUNITY): Payer: Medicare Other

## 2018-05-28 DIAGNOSIS — Z923 Personal history of irradiation: Secondary | ICD-10-CM

## 2018-05-28 DIAGNOSIS — Z955 Presence of coronary angioplasty implant and graft: Secondary | ICD-10-CM

## 2018-05-28 DIAGNOSIS — Z7902 Long term (current) use of antithrombotics/antiplatelets: Secondary | ICD-10-CM

## 2018-05-28 DIAGNOSIS — N183 Chronic kidney disease, stage 3 unspecified: Secondary | ICD-10-CM | POA: Diagnosis present

## 2018-05-28 DIAGNOSIS — Z7189 Other specified counseling: Secondary | ICD-10-CM | POA: Diagnosis not present

## 2018-05-28 DIAGNOSIS — Z7982 Long term (current) use of aspirin: Secondary | ICD-10-CM | POA: Diagnosis not present

## 2018-05-28 DIAGNOSIS — I10 Essential (primary) hypertension: Secondary | ICD-10-CM | POA: Diagnosis present

## 2018-05-28 DIAGNOSIS — D693 Immune thrombocytopenic purpura: Secondary | ICD-10-CM | POA: Diagnosis present

## 2018-05-28 DIAGNOSIS — Z8673 Personal history of transient ischemic attack (TIA), and cerebral infarction without residual deficits: Secondary | ICD-10-CM

## 2018-05-28 DIAGNOSIS — Z515 Encounter for palliative care: Secondary | ICD-10-CM | POA: Diagnosis not present

## 2018-05-28 DIAGNOSIS — A419 Sepsis, unspecified organism: Secondary | ICD-10-CM | POA: Diagnosis present

## 2018-05-28 DIAGNOSIS — R000 Tachycardia, unspecified: Secondary | ICD-10-CM | POA: Diagnosis not present

## 2018-05-28 DIAGNOSIS — E1122 Type 2 diabetes mellitus with diabetic chronic kidney disease: Secondary | ICD-10-CM | POA: Diagnosis present

## 2018-05-28 DIAGNOSIS — I499 Cardiac arrhythmia, unspecified: Secondary | ICD-10-CM | POA: Diagnosis not present

## 2018-05-28 DIAGNOSIS — Z794 Long term (current) use of insulin: Secondary | ICD-10-CM

## 2018-05-28 DIAGNOSIS — Z8249 Family history of ischemic heart disease and other diseases of the circulatory system: Secondary | ICD-10-CM

## 2018-05-28 DIAGNOSIS — J9811 Atelectasis: Secondary | ICD-10-CM | POA: Diagnosis not present

## 2018-05-28 DIAGNOSIS — Z8546 Personal history of malignant neoplasm of prostate: Secondary | ICD-10-CM

## 2018-05-28 DIAGNOSIS — Z888 Allergy status to other drugs, medicaments and biological substances status: Secondary | ICD-10-CM | POA: Diagnosis not present

## 2018-05-28 DIAGNOSIS — R7881 Bacteremia: Secondary | ICD-10-CM | POA: Diagnosis not present

## 2018-05-28 DIAGNOSIS — J189 Pneumonia, unspecified organism: Secondary | ICD-10-CM | POA: Diagnosis present

## 2018-05-28 DIAGNOSIS — J9601 Acute respiratory failure with hypoxia: Secondary | ICD-10-CM | POA: Diagnosis present

## 2018-05-28 DIAGNOSIS — D649 Anemia, unspecified: Secondary | ICD-10-CM

## 2018-05-28 DIAGNOSIS — Z79899 Other long term (current) drug therapy: Secondary | ICD-10-CM | POA: Diagnosis not present

## 2018-05-28 DIAGNOSIS — D61818 Other pancytopenia: Secondary | ICD-10-CM | POA: Diagnosis present

## 2018-05-28 DIAGNOSIS — J158 Pneumonia due to other specified bacteria: Secondary | ICD-10-CM | POA: Diagnosis present

## 2018-05-28 DIAGNOSIS — I509 Heart failure, unspecified: Secondary | ICD-10-CM

## 2018-05-28 DIAGNOSIS — R846 Abnormal cytological findings in specimens from respiratory organs and thorax: Secondary | ICD-10-CM | POA: Diagnosis not present

## 2018-05-28 DIAGNOSIS — R531 Weakness: Secondary | ICD-10-CM

## 2018-05-28 DIAGNOSIS — E1140 Type 2 diabetes mellitus with diabetic neuropathy, unspecified: Secondary | ICD-10-CM | POA: Diagnosis present

## 2018-05-28 DIAGNOSIS — I5020 Unspecified systolic (congestive) heart failure: Secondary | ICD-10-CM | POA: Diagnosis not present

## 2018-05-28 DIAGNOSIS — J90 Pleural effusion, not elsewhere classified: Secondary | ICD-10-CM | POA: Diagnosis not present

## 2018-05-28 DIAGNOSIS — I2510 Atherosclerotic heart disease of native coronary artery without angina pectoris: Secondary | ICD-10-CM | POA: Diagnosis present

## 2018-05-28 DIAGNOSIS — Z9221 Personal history of antineoplastic chemotherapy: Secondary | ICD-10-CM | POA: Diagnosis not present

## 2018-05-28 DIAGNOSIS — Y95 Nosocomial condition: Secondary | ICD-10-CM | POA: Diagnosis present

## 2018-05-28 DIAGNOSIS — E1151 Type 2 diabetes mellitus with diabetic peripheral angiopathy without gangrene: Secondary | ICD-10-CM | POA: Diagnosis present

## 2018-05-28 DIAGNOSIS — D469 Myelodysplastic syndrome, unspecified: Secondary | ICD-10-CM | POA: Diagnosis not present

## 2018-05-28 DIAGNOSIS — E785 Hyperlipidemia, unspecified: Secondary | ICD-10-CM | POA: Diagnosis present

## 2018-05-28 DIAGNOSIS — R011 Cardiac murmur, unspecified: Secondary | ICD-10-CM | POA: Diagnosis present

## 2018-05-28 DIAGNOSIS — R079 Chest pain, unspecified: Secondary | ICD-10-CM | POA: Diagnosis not present

## 2018-05-28 DIAGNOSIS — R509 Fever, unspecified: Secondary | ICD-10-CM | POA: Diagnosis not present

## 2018-05-28 DIAGNOSIS — N184 Chronic kidney disease, stage 4 (severe): Secondary | ICD-10-CM | POA: Diagnosis present

## 2018-05-28 DIAGNOSIS — I361 Nonrheumatic tricuspid (valve) insufficiency: Secondary | ICD-10-CM | POA: Diagnosis not present

## 2018-05-28 DIAGNOSIS — I999 Unspecified disorder of circulatory system: Secondary | ICD-10-CM | POA: Diagnosis not present

## 2018-05-28 DIAGNOSIS — I130 Hypertensive heart and chronic kidney disease with heart failure and stage 1 through stage 4 chronic kidney disease, or unspecified chronic kidney disease: Secondary | ICD-10-CM | POA: Diagnosis present

## 2018-05-28 DIAGNOSIS — Z833 Family history of diabetes mellitus: Secondary | ICD-10-CM | POA: Diagnosis not present

## 2018-05-28 DIAGNOSIS — I129 Hypertensive chronic kidney disease with stage 1 through stage 4 chronic kidney disease, or unspecified chronic kidney disease: Secondary | ICD-10-CM | POA: Diagnosis not present

## 2018-05-28 DIAGNOSIS — R0902 Hypoxemia: Secondary | ICD-10-CM

## 2018-05-28 DIAGNOSIS — N179 Acute kidney failure, unspecified: Secondary | ICD-10-CM | POA: Diagnosis present

## 2018-05-28 DIAGNOSIS — J181 Lobar pneumonia, unspecified organism: Secondary | ICD-10-CM | POA: Diagnosis not present

## 2018-05-28 DIAGNOSIS — R109 Unspecified abdominal pain: Secondary | ICD-10-CM | POA: Diagnosis not present

## 2018-05-28 DIAGNOSIS — R918 Other nonspecific abnormal finding of lung field: Secondary | ICD-10-CM | POA: Diagnosis not present

## 2018-05-28 DIAGNOSIS — K5790 Diverticulosis of intestine, part unspecified, without perforation or abscess without bleeding: Secondary | ICD-10-CM | POA: Diagnosis not present

## 2018-05-28 DIAGNOSIS — L989 Disorder of the skin and subcutaneous tissue, unspecified: Secondary | ICD-10-CM | POA: Diagnosis not present

## 2018-05-28 DIAGNOSIS — L988 Other specified disorders of the skin and subcutaneous tissue: Secondary | ICD-10-CM | POA: Diagnosis not present

## 2018-05-28 DIAGNOSIS — A4181 Sepsis due to Enterococcus: Secondary | ICD-10-CM | POA: Diagnosis present

## 2018-05-28 DIAGNOSIS — I5042 Chronic combined systolic (congestive) and diastolic (congestive) heart failure: Secondary | ICD-10-CM | POA: Diagnosis present

## 2018-05-28 DIAGNOSIS — W19XXXD Unspecified fall, subsequent encounter: Secondary | ICD-10-CM | POA: Diagnosis not present

## 2018-05-28 DIAGNOSIS — Z66 Do not resuscitate: Secondary | ICD-10-CM | POA: Diagnosis present

## 2018-05-28 DIAGNOSIS — Z960 Presence of urogenital implants: Secondary | ICD-10-CM | POA: Diagnosis not present

## 2018-05-28 DIAGNOSIS — G8929 Other chronic pain: Secondary | ICD-10-CM | POA: Diagnosis present

## 2018-05-28 DIAGNOSIS — D46C Myelodysplastic syndrome with isolated del(5q) chromosomal abnormality: Secondary | ICD-10-CM | POA: Diagnosis present

## 2018-05-28 DIAGNOSIS — S61210D Laceration without foreign body of right index finger without damage to nail, subsequent encounter: Secondary | ICD-10-CM | POA: Diagnosis not present

## 2018-05-28 DIAGNOSIS — K5730 Diverticulosis of large intestine without perforation or abscess without bleeding: Secondary | ICD-10-CM | POA: Diagnosis not present

## 2018-05-28 DIAGNOSIS — B952 Enterococcus as the cause of diseases classified elsewhere: Secondary | ICD-10-CM | POA: Diagnosis not present

## 2018-05-28 DIAGNOSIS — I340 Nonrheumatic mitral (valve) insufficiency: Secondary | ICD-10-CM | POA: Diagnosis not present

## 2018-05-28 DIAGNOSIS — Z87891 Personal history of nicotine dependence: Secondary | ICD-10-CM | POA: Diagnosis not present

## 2018-05-28 DIAGNOSIS — E119 Type 2 diabetes mellitus without complications: Secondary | ICD-10-CM

## 2018-05-28 DIAGNOSIS — K5900 Constipation, unspecified: Secondary | ICD-10-CM | POA: Diagnosis present

## 2018-05-28 LAB — CBC WITH DIFFERENTIAL/PLATELET
Abs Immature Granulocytes: 0.06 10*3/uL (ref 0.00–0.07)
Basophils Absolute: 0 10*3/uL (ref 0.0–0.1)
Basophils Relative: 0 %
Eosinophils Absolute: 0 10*3/uL (ref 0.0–0.5)
Eosinophils Relative: 0 %
HCT: 27 % — ABNORMAL LOW (ref 39.0–52.0)
Hemoglobin: 7.7 g/dL — ABNORMAL LOW (ref 13.0–17.0)
Immature Granulocytes: 1 %
Lymphocytes Relative: 55 %
Lymphs Abs: 2.6 10*3/uL (ref 0.7–4.0)
MCH: 24.3 pg — ABNORMAL LOW (ref 26.0–34.0)
MCHC: 28.5 g/dL — AB (ref 30.0–36.0)
MCV: 85.2 fL (ref 80.0–100.0)
Monocytes Absolute: 0.7 10*3/uL (ref 0.1–1.0)
Monocytes Relative: 15 %
NEUTROS ABS: 1.4 10*3/uL — AB (ref 1.7–7.7)
Neutrophils Relative %: 29 %
Platelets: 144 10*3/uL — ABNORMAL LOW (ref 150–400)
RBC: 3.17 MIL/uL — AB (ref 4.22–5.81)
RDW: 23.1 % — AB (ref 11.5–15.5)
WBC Morphology: ABNORMAL
WBC: 4.8 10*3/uL (ref 4.0–10.5)
nRBC: 0 % (ref 0.0–0.2)

## 2018-05-28 LAB — RESPIRATORY PANEL BY PCR
ADENOVIRUS-RVPPCR: NOT DETECTED
Bordetella pertussis: NOT DETECTED
CORONAVIRUS NL63-RVPPCR: NOT DETECTED
CORONAVIRUS OC43-RVPPCR: NOT DETECTED
Chlamydophila pneumoniae: NOT DETECTED
Coronavirus 229E: NOT DETECTED
Coronavirus HKU1: NOT DETECTED
Influenza A: NOT DETECTED
Influenza B: NOT DETECTED
Metapneumovirus: NOT DETECTED
Mycoplasma pneumoniae: NOT DETECTED
Parainfluenza Virus 1: NOT DETECTED
Parainfluenza Virus 2: NOT DETECTED
Parainfluenza Virus 3: NOT DETECTED
Parainfluenza Virus 4: NOT DETECTED
Respiratory Syncytial Virus: NOT DETECTED
Rhinovirus / Enterovirus: NOT DETECTED

## 2018-05-28 LAB — COMPREHENSIVE METABOLIC PANEL
ALT: 16 U/L (ref 0–44)
AST: 15 U/L (ref 15–41)
Albumin: 2.9 g/dL — ABNORMAL LOW (ref 3.5–5.0)
Alkaline Phosphatase: 71 U/L (ref 38–126)
Anion gap: 11 (ref 5–15)
BUN: 38 mg/dL — ABNORMAL HIGH (ref 8–23)
CO2: 24 mmol/L (ref 22–32)
Calcium: 8.7 mg/dL — ABNORMAL LOW (ref 8.9–10.3)
Chloride: 106 mmol/L (ref 98–111)
Creatinine, Ser: 2.11 mg/dL — ABNORMAL HIGH (ref 0.61–1.24)
GFR calc Af Amer: 32 mL/min — ABNORMAL LOW (ref >60)
GFR calc non Af Amer: 28 mL/min — ABNORMAL LOW (ref >60)
Glucose, Bld: 169 mg/dL — ABNORMAL HIGH (ref 70–99)
Potassium: 3.9 mmol/L (ref 3.5–5.1)
Sodium: 141 mmol/L (ref 135–145)
Total Bilirubin: 0.7 mg/dL (ref 0.3–1.2)
Total Protein: 7.9 g/dL (ref 6.5–8.1)

## 2018-05-28 LAB — URINALYSIS, ROUTINE W REFLEX MICROSCOPIC
Bilirubin Urine: NEGATIVE
GLUCOSE, UA: NEGATIVE mg/dL
Hgb urine dipstick: NEGATIVE
Ketones, ur: NEGATIVE mg/dL
Leukocytes, UA: NEGATIVE
Nitrite: NEGATIVE
Protein, ur: 30 mg/dL — AB
Specific Gravity, Urine: 1.015 (ref 1.005–1.030)
pH: 5 (ref 5.0–8.0)

## 2018-05-28 LAB — CBG MONITORING, ED: Glucose-Capillary: 159 mg/dL — ABNORMAL HIGH (ref 70–99)

## 2018-05-28 LAB — I-STAT CG4 LACTIC ACID, ED
LACTIC ACID, VENOUS: 0.52 mmol/L (ref 0.5–1.9)
Lactic Acid, Venous: 1.43 mmol/L (ref 0.5–1.9)

## 2018-05-28 LAB — STREP PNEUMONIAE URINARY ANTIGEN: Strep Pneumo Urinary Antigen: NEGATIVE

## 2018-05-28 LAB — POC OCCULT BLOOD, ED: Fecal Occult Bld: NEGATIVE

## 2018-05-28 MED ORDER — MECLIZINE HCL 12.5 MG PO TABS: 12.5000 mg | ORAL_TABLET | Freq: Three times a day (TID) | ORAL | Status: DC | PRN

## 2018-05-28 MED ORDER — METOPROLOL SUCCINATE ER 100 MG PO TB24
100.0000 mg | ORAL_TABLET | Freq: Every day | ORAL | Status: DC
  Administered 2018-05-28 – 2018-06-04 (×8): 100 mg via ORAL
  Filled 2018-05-28: qty 1
  Filled 2018-05-28: qty 4
  Filled 2018-05-28 (×7): qty 1

## 2018-05-28 MED ORDER — SODIUM CHLORIDE 0.9 % IV SOLN
2.0000 g | Freq: Once | INTRAVENOUS | Status: AC
  Administered 2018-05-28: 2 g via INTRAVENOUS
  Filled 2018-05-28: qty 2

## 2018-05-28 MED ORDER — CLOPIDOGREL BISULFATE 75 MG PO TABS
75.0000 mg | ORAL_TABLET | Freq: Every day | ORAL | Status: DC
  Administered 2018-05-28 – 2018-06-07 (×11): 75 mg via ORAL
  Filled 2018-05-28 (×11): qty 1

## 2018-05-28 MED ORDER — PRAVASTATIN SODIUM 40 MG PO TABS
40.0000 mg | ORAL_TABLET | Freq: Every day | ORAL | Status: DC
  Administered 2018-05-28 – 2018-06-12 (×15): 40 mg via ORAL
  Filled 2018-05-28 (×15): qty 1

## 2018-05-28 MED ORDER — BACITRACIN-NEOMYCIN-POLYMYXIN 400-5-5000 EX OINT: 1.0000 "application " | TOPICAL_OINTMENT | CUTANEOUS | Status: DC | PRN

## 2018-05-28 MED ORDER — INSULIN GLARGINE 100 UNIT/ML ~~LOC~~ SOLN
20.0000 [IU] | Freq: Every morning | SUBCUTANEOUS | Status: DC
  Administered 2018-05-29 – 2018-06-12 (×15): 20 [IU] via SUBCUTANEOUS
  Filled 2018-05-28 (×16): qty 0.2

## 2018-05-28 MED ORDER — TAMSULOSIN HCL 0.4 MG PO CAPS
0.4000 mg | ORAL_CAPSULE | Freq: Every day | ORAL | Status: DC
  Administered 2018-05-28 – 2018-06-12 (×16): 0.4 mg via ORAL
  Filled 2018-05-28 (×16): qty 1

## 2018-05-28 MED ORDER — NITROGLYCERIN 0.4 MG SL SUBL: 0.4000 mg | SUBLINGUAL_TABLET | SUBLINGUAL | Status: DC | PRN

## 2018-05-28 MED ORDER — ASPIRIN EC 81 MG PO TBEC
81.0000 mg | DELAYED_RELEASE_TABLET | Freq: Every day | ORAL | Status: DC
  Administered 2018-05-28 – 2018-06-08 (×12): 81 mg via ORAL
  Filled 2018-05-28 (×12): qty 1

## 2018-05-28 MED ORDER — SODIUM CHLORIDE 0.9 % IV SOLN
1.0000 g | Freq: Two times a day (BID) | INTRAVENOUS | Status: DC
  Administered 2018-05-28 – 2018-05-29 (×2): 1 g via INTRAVENOUS
  Filled 2018-05-28 (×2): qty 1

## 2018-05-28 MED ORDER — FUROSEMIDE 40 MG PO TABS
60.0000 mg | ORAL_TABLET | Freq: Every day | ORAL | Status: DC
  Administered 2018-05-28: 60 mg via ORAL
  Filled 2018-05-28: qty 3

## 2018-05-28 MED ORDER — SODIUM CHLORIDE 0.9 % IV BOLUS
1000.0000 mL | Freq: Once | INTRAVENOUS | Status: AC
  Administered 2018-05-28: 1000 mL via INTRAVENOUS

## 2018-05-28 MED ORDER — POTASSIUM CHLORIDE IN NACL 20-0.9 MEQ/L-% IV SOLN
INTRAVENOUS | Status: DC
  Administered 2018-05-28: 13:00:00 via INTRAVENOUS
  Filled 2018-05-28: qty 1000

## 2018-05-28 MED ORDER — VANCOMYCIN HCL IN DEXTROSE 750-5 MG/150ML-% IV SOLN
750.0000 mg | INTRAVENOUS | Status: DC
  Filled 2018-05-28: qty 150

## 2018-05-28 MED ORDER — ISOSORBIDE MONONITRATE ER 60 MG PO TB24
60.0000 mg | ORAL_TABLET | Freq: Every day | ORAL | Status: DC
  Administered 2018-05-28 – 2018-06-04 (×8): 60 mg via ORAL
  Filled 2018-05-28 (×4): qty 1
  Filled 2018-05-28: qty 2
  Filled 2018-05-28 (×3): qty 1

## 2018-05-28 MED ORDER — ADULT MULTIVITAMIN W/MINERALS CH
1.0000 | ORAL_TABLET | Freq: Every morning | ORAL | Status: DC
  Administered 2018-05-28 – 2018-06-12 (×16): 1 via ORAL
  Filled 2018-05-28 (×17): qty 1

## 2018-05-28 MED ORDER — VANCOMYCIN HCL IN DEXTROSE 1-5 GM/200ML-% IV SOLN: 1000.0000 mg | Freq: Once | INTRAVENOUS | Status: DC

## 2018-05-28 MED ORDER — TRAMADOL HCL 50 MG PO TABS: 50.0000 mg | ORAL_TABLET | Freq: Four times a day (QID) | ORAL | Status: DC | PRN

## 2018-05-28 MED ORDER — TRAMADOL HCL 50 MG PO TABS
50.0000 mg | ORAL_TABLET | Freq: Two times a day (BID) | ORAL | Status: DC | PRN
  Administered 2018-05-28 – 2018-06-05 (×4): 50 mg via ORAL
  Filled 2018-05-28 (×5): qty 1

## 2018-05-28 MED ORDER — GABAPENTIN 100 MG PO CAPS
100.0000 mg | ORAL_CAPSULE | Freq: Every day | ORAL | Status: DC
  Administered 2018-05-29 – 2018-06-11 (×15): 100 mg via ORAL
  Filled 2018-05-28 (×14): qty 1

## 2018-05-28 MED ORDER — LOPERAMIDE HCL 2 MG PO CAPS: 2.0000 mg | ORAL_CAPSULE | ORAL | Status: DC | PRN

## 2018-05-28 MED ORDER — VANCOMYCIN HCL 10 G IV SOLR
1500.0000 mg | Freq: Once | INTRAVENOUS | Status: AC
  Administered 2018-05-28: 1500 mg via INTRAVENOUS
  Filled 2018-05-28: qty 1500

## 2018-05-28 NOTE — ED Provider Notes (Addendum)
[facility] DE[facility]MENT Provider Note   CSN: [id number] Arrival date & time: 05/28/18  0815     History   Chief Complaint Chief Complaint  Patient presents with  . Code Sepsis    HPI MARCELLES CLINARD is a 84 y.o. male.  Patient with hx MDS, cad, presents with general weakness and fever. Symptoms acute onset in past 1-2 days, persistent, moderate. Fever onset in past day, 102. Pt w recent office visit, noted with progressive anemia, hgb 7.3. Pt denies blood loss, rectal bleeding or melena. Pt notes episodic, non productive cough. No sore throat or runny nose. No chest pain. No abd pain. Denies vomiting or diarrhea. Recent poor po intake. Pt w recent right index finger infection - pt/family note slowly improving, is still taking doxycycline.   The history is provided by the patient.    Past Medical History:  Diagnosis Date  . Adenomatous colon polyp   . CAD (coronary artery disease)    30% LAD Stenosis, 70% ramus intermedius stenosis, treated with PTCA and angioplasty by Dr Albertine Patricia 2004  . Cataract    right eye  . CHF (congestive heart failure) ([place])   . Chronic ITP (idiopathic thrombocytopenia) (HCC)   . Cough, persistent 11/04/2015  . Diverticulosis   . DM (diabetes mellitus) ([place])   . DVT (deep venous thrombosis) ([place])    secondary to surgery  . Dyspnea   . Fever 05/01/2018  . Hyperlipidemia   . Hypertension   . Inguinal hernia    right  . Laceration of finger of right hand 05/01/2018   INDEX FINGER  . 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 Microcytic anemia 07/07/2015  . 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 Non Hodgkin's lymphoma ([place])   . Pleural effusion, left 11/04/2015  . Prostate CA ([place]) 09/10/2011   Gleason 3+3 R, 3+4 L lobe May 2007 Rx Radioactive seed implants Dr [name]  . PVD (peripheral vascular disease) ([place])    rt renal artery stent  . Renal insufficiency   . Thrombocytopenia ([place])   .  Thrombotic stroke ([place]) 09/10/2011   January 18, 2011 infarct genu & post limb R internal capsule - acute; previous lacunar infarcts/extensive white matter dis  . Thyroid nodule    left lower lobe (annual monitoring).  . Vitamin D deficiency     Patient Active Problem List   Diagnosis Date Noted  . Fever 05/01/2018  . Unstable angina ([place])   . Chronic systolic CHF (congestive heart failure) ([place])   . Acute on chronic combined systolic and diastolic CHF (congestive heart failure) ([place]) 04/22/2018  . Nonrheumatic aortic valve stenosis   . Chest pain in adult 04/20/2018  . Chronic neutropenia ([place]) 03/02/2018  . CHF (congestive heart failure) ([place]) 02/19/2018  . Upper airway cough syndrome 10/27/2017  . Abnormal CT of the chest 07/27/2017  . Flu-like symptoms 06/02/2017  . Elevated lactic acid level 06/02/2017  . Acute kidney injury superimposed on chronic kidney disease ([place]) 06/02/2017  . CKD (chronic kidney disease), stage IV ([place]) 12/16/2016  . Bilateral carotid artery stenosis 12/11/2016  . Dyslipidemia 12/11/2016  . Anemia in chronic kidney disease 07/01/2016  . Chronic ITP (idiopathic thrombocytopenia) (HCC) 07/01/2016  . B12 deficiency anemia   . Chest tube in place   . Acute diastolic CHF (congestive heart failure) ([place]) 04/16/2016  . Acute respiratory failure with hypoxia ([place]) 04/14/2016  . Empyema lung ([place])   .  Type 2 diabetes mellitus ([place]) 04/04/2016  . Empyema ([place]) 04/03/2016  . Thyroid nodule 03/03/2016  . Sepsis ([place]) 02/17/2016  . DOE (dyspnea on exertion) 01/27/2016  . Pulmonary edema with congestive heart failure ([place]) 11/12/2015  . Cough, persistent 11/04/2015  . Pleural effusion, left 11/04/2015  . Anorexia 08/08/2015  . Microcytic anemia 07/07/2015  . Pancytopenia ([place]) 04/08/2015  . Diarrhea 04/08/2015  . Encounter for chemotherapy management 04/02/2015  . Neutropenia ([place]) 02/18/2015  . 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 Deficiency anemia 04/19/2014  . Thrombocytopenia ([place]) 04/19/2014  . Vitamin B12 deficiency 04/19/2014  . Chronic renal failure, stage 3 (moderate) ([place]) 04/19/2014  . Macrocytic anemia 03/20/2013  . Monocytosis 03/20/2013  . Thrombotic stroke ([place]) 09/10/2011  . Prostate CA ([place]) 09/10/2011  . Inguinal hernia   . Colon polyps   . CAD (coronary artery disease) 08/26/2010  . Murmur 08/26/2010  . MURMUR 08/26/2010  . History of lymphoma 08/25/2010  . Diabetes ([place]) 08/25/2010  . THROMBOCYTOPENIA 08/25/2010  . Essential hypertension 08/25/2010  . Coronary atherosclerosis 08/25/2010  . PVD 08/25/2010    Past Surgical History:  Procedure Laterality Date  . APPENDECTOMY     patient ?  Marland Kitchen CARDIAC CATHETERIZATION    . CATARACT EXTRACTION    . CHEST TUBE INSERTION Left 02/10/2016   Procedure: INSERTION PLEURAL DRAINAGE CATHETER;  Surgeon: [name], MD;  Location: [facility];  Service: Thoracic;  Laterality: Left;  . CHEST TUBE INSERTION Left 04/08/2016   Procedure: CHEST TUBE INSERTION;  Surgeon: [name], MD;  Location: [facility];  Service: Thoracic;  Laterality: Left;  . COLONOSCOPY    . CORONARY ANGIOPLASTY    . CYSTOURETHROSCOPY     ROBOTIC ARM NUCLETRON SEED IMPLANTATION OF PROSTATE  . EXPLORATORY LAPAROTOMY     For evaluation of lymphoma  . REMOVAL OF PLEURAL DRAINAGE CATHETER Left 04/08/2016   Procedure: REMOVAL OF PLEURAL DRAINAGE CATHETER;  Surgeon: [name], MD;  Location: [facility];  Service: Thoracic;  Laterality: Left;        Home Medications    Prior to Admission medications   Medication Sig Start Date End Date Taking? Authorizing Provider  acetaminophen (TYLENOL) 500 MG tablet Take 1,000 mg by mouth every 6 (six) hours as needed for mild pain, moderate pain or headache.     [provider]  aspirin EC 81 MG EC tablet Take 1 tablet (81 mg total) by mouth daily. 05/04/18 06/03/18  Amin, Jeanella Flattery, MD  clopidogrel (PLAVIX) 75 MG tablet TAKE 1  TABLET BY MOUTH ONCE A DAY WITH BREAKFAST Patient taking differently: Take 75 mg by mouth daily.  04/10/18   [name], MD  furosemide (LASIX) 40 MG tablet Take 60mg  daily by mouth. (3 tablets) Except excess swelling or weight gain take an additional 40mg  daily (2 tablets) Patient taking differently: Take 40 mg by mouth daily.  04/22/18   Lady Deutscher, MD  gabapentin (NEURONTIN) 100 MG capsule TAKE 1 CAPSULE BY MOUTH AT BEDTIME Patient taking differently: Take 100 mg by mouth at bedtime.  03/20/18   [name], MD  Insulin Glargine (LANTUS SOLOSTAR) 100 UNIT/ML Solostar Pen INJECT 20 UNITS EVERY NIGHT AT BEDTIME Patient taking differently: Inject 20 Units into the skin every morning. INJECT 20 UNITS EVERY NIGHT AT BEDTIME 04/22/18   Lady Deutscher, MD  isosorbide mononitrate (IMDUR) 60 MG 24 hr tablet Take 1 tablet (60 mg total) by mouth daily. 05/11/18  06/10/18  [name], MD  loperamide (IMODIUM) 2 MG capsule Take 2 mg by mouth as needed for diarrhea or loose stools.    [provider]  meclizine (ANTIVERT) 12.5 MG tablet Take 1 tablet (12.5 mg total) by mouth 3 (three) times daily as needed for dizziness. 06/27/15   [name], MD  metoprolol succinate (TOPROL-XL) 100 MG 24 hr tablet TAKE 1 TABLET BY MOUTH ONCE DAILY Patient taking differently: Take 100 mg by mouth daily.  12/28/17   [name], MD  Multiple Vitamin (MULTIVITAMIN WITH MINERALS) TABS tablet Take 1 tablet by mouth every morning.     [provider]  nitroGLYCERIN (NITROSTAT) 0.4 MG SL tablet Place 1 tablet (0.4 mg total) under the tongue every 5 (five) minutes as needed for chest pain. 05/03/18 05/03/19  Amin, Jeanella Flattery, MD  NON FORMULARY Inject 1 Dose into the vein once a week. Platelet booster    [provider]  OVER THE COUNTER MEDICATION Place 1 drop into both eyes daily as needed (dry eyes). Over the counter lubricating eye drop    [provider]    pravastatin (PRAVACHOL) 40 MG tablet Take 1 tablet (40 mg total) by mouth daily at 6 PM. 05/03/18   Amin, Jeanella Flattery, MD  tamsulosin (FLOMAX) 0.4 MG CAPS capsule TAKE 1 CAPSULE BY MOUTH ONCE DAILY Patient taking differently: Take 0.4 mg by mouth daily.  04/11/18   [name], MD  traMADol (ULTRAM) 50 MG tablet Take 1 tablet (50 mg total) by mouth every 6 (six) hours as needed for moderate pain. 01/03/18   [name], MD    Family History Family History  Problem Relation Age of Onset  . Lymphoma Sister   . Prostate cancer Brother   . Breast cancer Sister   . Diabetes Brother   . Diabetes Sister   . Heart disease Brother   . Asthma Son     Social History Social History   Tobacco Use  . Smoking status: Former Smoker    Packs/day: 0.50    Years: 20.00    Pack years: 10.00    Types: Pipe, Cigarettes    Last attempt to quit: 06/21/1958    Years since quitting: 59.9  . Smokeless tobacco: Never Used  Substance Use Topics  . Alcohol use: No    Alcohol/week: 0.0 standard drinks  . Drug use: No     Allergies   Glucophage [metformin hydrochloride]; Zetia [ezetimibe]; Fenofibrate; and Niacin-lovastatin er   Review of Systems Review of Systems  Constitutional: Positive for fever.  HENT: Negative for sore throat.   Eyes: Negative for redness.  Respiratory: Positive for cough. Negative for shortness of breath.   Cardiovascular: Negative for chest pain.  Gastrointestinal: Negative for abdominal pain.  Endocrine: Negative for polyuria.  Genitourinary: Negative for dysuria and flank pain.  Musculoskeletal: Negative for back pain and neck pain.  Skin: Negative for rash.  Neurological: Negative for headaches.  Hematological: Does not bruise/bleed easily.  Psychiatric/Behavioral: Negative for confusion.     Physical Exam Updated Vital Signs Pulse (!) 124   Temp (!) 102.3 F (39.1 C) (Oral)   Resp 17   Ht 1.753 m (5\' 9" )   Wt 83.4 kg   SpO2 93%   BMI 27.15  kg/m   Physical Exam  Constitutional: He appears well-developed and well-nourished.  HENT:  Mouth/Throat: Oropharynx is clear and moist.  Eyes: Conjunctivae are normal. No scleral icterus.  Neck: Neck supple.  No tracheal deviation present. No thyromegaly present.  No stiffness or rigidity.   Cardiovascular: Normal rate, regular rhythm, normal heart sounds and intact distal pulses. Exam reveals no gallop and no friction rub.  No murmur heard. Pulmonary/Chest: Effort normal and breath sounds normal. No accessory muscle usage. No respiratory distress.  Abdominal: Soft. Bowel sounds are normal. He exhibits no distension and no mass. There is no tenderness. There is no guarding.  Genitourinary:  Genitourinary Comments: No cva tenderness. Soft stool, medium brown - sent for hemoccult. No mass felt.   Musculoskeletal: He exhibits no edema.  Right index finger with mild erythema dorsally centered around pip. No purulent drainage. No pain w passive rom digit. Normal cap refill distally.   Neurological: He is alert.  Speech clear/fluent. Motor/sens grossly intact bil.   Skin: Skin is warm and dry. No rash noted. There is pallor.  Psychiatric: He has a normal mood and affect.  Nursing note and vitals reviewed.    ED Treatments / Results  Labs (all labs ordered are listed, but only abnormal results are displayed) Results for orders placed or performed during the hospital encounter of 05/28/18  Blood Culture (routine x 2)  Result Value Ref Range   Specimen Description BLOOD BLOOD RIGHT FOREARM    Special Requests      BOTTLES DRAWN AEROBIC AND ANAEROBIC Blood Culture adequate volume   Culture      NO GROWTH 5 DAYS Performed at [facility], [place] [address] [place], [place] [address]    Report Status 06/02/2018 FINAL   Blood Culture (routine x 2)  Result Value Ref Range   Specimen Description BLOOD RIGHT ANTECUBITAL    Special Requests      BOTTLES DRAWN AEROBIC AND ANAEROBIC  Blood Culture adequate volume   Culture  Setup Time      GRAM POSITIVE COCCI AEROBIC BOTTLE ONLY CRITICAL RESULT CALLED TO, READ BACK BY AND VERIFIED WITH: Salli Real [id number] Performed at [facility], [place] [address], [place], [place] [address]    Culture ENTEROCOCCUS FAECIUM (A)    Report Status 05/31/2018 FINAL    Organism ID, Bacteria ENTEROCOCCUS FAECIUM       Susceptibility   Enterococcus faecium - MIC*    AMPICILLIN <=2 SENSITIVE Sensitive     VANCOMYCIN 1 SENSITIVE Sensitive     GENTAMICIN SYNERGY SENSITIVE Sensitive     * ENTEROCOCCUS FAECIUM  Urine culture  Result Value Ref Range   Specimen Description URINE, RANDOM    Special Requests NONE    Culture (A)     <10,000 COLONIES/mL INSIGNIFICANT GROWTH Performed at [facility] [address], [place], [place] [address]    Report Status 05/29/2018 FINAL   Respiratory Panel by PCR  Result Value Ref Range   Adenovirus NOT DETECTED NOT DETECTED   Coronavirus 229E NOT DETECTED NOT DETECTED   Coronavirus HKU1 NOT DETECTED NOT DETECTED   Coronavirus NL63 NOT DETECTED NOT DETECTED   Coronavirus OC43 NOT DETECTED NOT DETECTED   Metapneumovirus NOT DETECTED NOT DETECTED   Rhinovirus / Enterovirus NOT DETECTED NOT DETECTED   Influenza A NOT DETECTED NOT DETECTED   Influenza B NOT DETECTED NOT DETECTED   Parainfluenza Virus 1 NOT DETECTED NOT DETECTED   Parainfluenza Virus 2 NOT DETECTED NOT DETECTED   Parainfluenza Virus 3 NOT DETECTED NOT DETECTED   Parainfluenza Virus 4 NOT DETECTED NOT DETECTED   Respiratory Syncytial Virus NOT DETECTED NOT DETECTED  Bordetella pertussis NOT DETECTED NOT DETECTED   Chlamydophila pneumoniae NOT DETECTED NOT DETECTED   Mycoplasma pneumoniae NOT DETECTED NOT DETECTED  Blood Culture ID Panel (Reflexed)  Result Value Ref Range   Enterococcus species DETECTED (A) NOT DETECTED   Vancomycin resistance NOT DETECTED NOT DETECTED   Listeria monocytogenes NOT DETECTED NOT  DETECTED   Staphylococcus species NOT DETECTED NOT DETECTED   Staphylococcus aureus (BCID) NOT DETECTED NOT DETECTED   Streptococcus species NOT DETECTED NOT DETECTED   Streptococcus agalactiae NOT DETECTED NOT DETECTED   Streptococcus pneumoniae NOT DETECTED NOT DETECTED   Streptococcus pyogenes NOT DETECTED NOT DETECTED   Acinetobacter baumannii NOT DETECTED NOT DETECTED   Enterobacteriaceae species NOT DETECTED NOT DETECTED   Enterobacter cloacae complex NOT DETECTED NOT DETECTED   Escherichia coli NOT DETECTED NOT DETECTED   Klebsiella oxytoca NOT DETECTED NOT DETECTED   Klebsiella pneumoniae NOT DETECTED NOT DETECTED   Proteus species NOT DETECTED NOT DETECTED   Serratia marcescens NOT DETECTED NOT DETECTED   Haemophilus influenzae NOT DETECTED NOT DETECTED   Neisseria meningitidis NOT DETECTED NOT DETECTED   Pseudomonas aeruginosa NOT DETECTED NOT DETECTED   Candida albicans NOT DETECTED NOT DETECTED   Candida glabrata NOT DETECTED NOT DETECTED   Candida krusei NOT DETECTED NOT DETECTED   Candida parapsilosis NOT DETECTED NOT DETECTED   Candida tropicalis NOT DETECTED NOT DETECTED  Culture, blood (Routine X 2) w Reflex to ID Panel  Result Value Ref Range   Specimen Description BLOOD RIGHT ANTECUBITAL    Special Requests      BOTTLES DRAWN AEROBIC ONLY Blood Culture adequate volume   Culture      NO GROWTH 5 DAYS Performed at [facility], [address], [place] [address], [place] [address]    Report Status 06/03/2018 FINAL   Culture, blood (Routine X 2) w Reflex to ID Panel  Result Value Ref Range   Specimen Description BLOOD RIGHT HAND    Special Requests      BOTTLES DRAWN AEROBIC ONLY Blood Culture adequate volume   Culture      NO GROWTH 5 DAYS Performed at [facility], [place] [address], [place], [place] [address]    Report Status 06/06/2018 FINAL   Culture, blood (Routine X 2) w Reflex to ID Panel  Result Value Ref Range   Specimen Description BLOOD RIGHT  HAND    Special Requests      BOTTLES DRAWN AEROBIC ONLY Blood Culture adequate volume   Culture      NO GROWTH 5 DAYS Performed at [facility], [place] [address] [place], [place] [address]    Report Status 06/06/2018 FINAL   Culture, blood (routine x 2)  Result Value Ref Range   Specimen Description BLOOD BLOOD RIGHT HAND    Special Requests      BOTTLES DRAWN AEROBIC ONLY Blood Culture adequate volume   Culture      NO GROWTH 5 DAYS Performed at [facility], [place] [address], [place], [place] [address]    Report Status 06/08/2018 FINAL   Culture, blood (routine x 2)  Result Value Ref Range   Specimen Description BLOOD BLOOD RIGHT HAND    Special Requests      BOTTLES DRAWN AEROBIC ONLY Blood Culture results may not be optimal due to an inadequate volume of blood received in culture bottles   Culture      NO GROWTH 5 DAYS Performed at [facility]  [address], [place], [place] [address]    Report Status 06/08/2018 FINAL   Culture, blood (routine x 2)  Result Value Ref Range   Specimen Description BLOOD RIGHT HAND    Special Requests      BOTTLES DRAWN AEROBIC ONLY Blood Culture results may not be optimal due to an inadequate volume of blood received in culture bottles   Culture      NO GROWTH 5 DAYS Performed at [facility] [address] [place], [place] [address]    Report Status 06/09/2018 FINAL   Culture, blood (routine x 2)  Result Value Ref Range   Specimen Description BLOOD RIGHT HAND    Special Requests      BOTTLES DRAWN AEROBIC ONLY Blood Culture results may not be optimal due to an inadequate volume of blood received in culture bottles   Culture      NO GROWTH 5 DAYS Performed at [facility] [address] [place],  [address]    Report Status 06/09/2018 FINAL   Comprehensive metabolic panel  Result Value Ref Range   Sodium 141 135 - 145 mmol/L   Potassium 3.9 3.5 - 5.1 mmol/L   Chloride 106 98 - 111 mmol/L    CO2 24 22 - 32 mmol/L   Glucose, Bld 169 (H) 70 - 99 mg/dL   BUN 38 (H) 8 - 23 mg/dL   Creatinine, Ser 2.11 (H) 0.61 - 1.24 mg/dL   Calcium 8.7 (L) 8.9 - 10.3 mg/dL   Total Protein 7.9 6.5 - 8.1 g/dL   Albumin 2.9 (L) 3.5 - 5.0 g/dL   AST 15 15 - 41 U/L   ALT 16 0 - 44 U/L   Alkaline Phosphatase 71 38 - 126 U/L   Total Bilirubin 0.7 0.3 - 1.2 mg/dL   GFR calc non Af Amer 28 (L) >60 mL/min   GFR calc Af Amer 32 (L) >60 mL/min   Anion gap 11 5 - 15  CBC WITH DIFFERENTIAL  Result Value Ref Range   WBC 4.8 4.0 - 10.5 K/uL   RBC 3.17 (L) 4.22 - 5.81 MIL/uL   Hemoglobin 7.7 (L) 13.0 - 17.0 g/dL   HCT 27.0 (L) 39.0 - 52.0 %   MCV 85.2 80.0 - 100.0 fL   MCH 24.3 (L) 26.0 - 34.0 pg   MCHC 28.5 (L) 30.0 - 36.0 g/dL   RDW 23.1 (H) 11.5 - 15.5 %   Platelets 144 (L) 150 - 400 K/uL   nRBC 0.0 0.0 - 0.2 %   Neutrophils Relative % 29 %   Neutro Abs 1.4 (L) 1.7 - 7.7 K/uL   Lymphocytes Relative 55 %   Lymphs Abs 2.6 0.7 - 4.0 K/uL   Monocytes Relative 15 %   Monocytes Absolute 0.7 0.1 - 1.0 K/uL   Eosinophils Relative 0 %   Eosinophils Absolute 0.0 0.0 - 0.5 K/uL   Basophils Relative 0 %   Basophils Absolute 0.0 0.0 - 0.1 K/uL   WBC Morphology Abnormal lymphocytes present    Immature Granulocytes 1 %   Abs Immature Granulocytes 0.06 0.00 - 0.07 K/uL   Acanthocytes PRESENT    Ovalocytes PRESENT   Urinalysis, Routine w reflex microscopic  Result Value Ref Range   Color, Urine YELLOW YELLOW   APPearance CLEAR CLEAR   Specific Gravity, Urine 1.015 1.005 - 1.030   pH 5.0 5.0 - 8.0   Glucose, UA NEGATIVE NEGATIVE mg/dL   Hgb urine dipstick NEGATIVE NEGATIVE  Bilirubin Urine NEGATIVE NEGATIVE   Ketones, ur NEGATIVE NEGATIVE mg/dL   Protein, ur 30 (A) NEGATIVE mg/dL   Nitrite NEGATIVE NEGATIVE   Leukocytes, UA NEGATIVE NEGATIVE   RBC / HPF 0-5 0 - 5 RBC/hpf   WBC, UA 0-5 0 - 5 WBC/hpf   Bacteria, UA RARE (A) NONE SEEN   Squamous Epithelial / LPF 0-5 0 - 5   Mucus PRESENT   HIV  antibody (Routine Screening)  Result Value Ref Range   HIV Screen 4th Generation wRfx Non Reactive Non Reactive  Strep pneumoniae urinary antigen  Result Value Ref Range   Strep Pneumo Urinary Antigen NEGATIVE NEGATIVE  Pathologist smear review  Result Value Ref Range   Path Review      Pancytopenia.  Normocytic anemia.  Anisopoikilocytosis.  Enlarged platelets.  Basic metabolic panel  Result Value Ref Range   Sodium 142 135 - 145 mmol/L   Potassium 3.9 3.5 - 5.1 mmol/L   Chloride 108 98 - 111 mmol/L   CO2 24 22 - 32 mmol/L   Glucose, Bld 179 (H) 70 - 99 mg/dL   BUN 39 (H) 8 - 23 mg/dL   Creatinine, Ser 2.40 (H) 0.61 - 1.24 mg/dL   Calcium 8.2 (L) 8.9 - 10.3 mg/dL   GFR calc non Af Amer 24 (L) >60 mL/min   GFR calc Af Amer 27 (L) >60 mL/min   Anion gap 10 5 - 15  CBC WITH DIFFERENTIAL  Result Value Ref Range   WBC 3.8 (L) 4.0 - 10.5 K/uL   RBC 2.61 (L) 4.22 - 5.81 MIL/uL   Hemoglobin 6.5 (LL) 13.0 - 17.0 g/dL   HCT 22.0 (L) 39.0 - 52.0 %   MCV 84.3 80.0 - 100.0 fL   MCH 24.9 (L) 26.0 - 34.0 pg   MCHC 29.5 (L) 30.0 - 36.0 g/dL   RDW 23.4 (H) 11.5 - 15.5 %   Platelets PLATELET CLUMPS NOTED ON SMEAR, UNABLE TO ESTIMATE 150 - 400 K/uL   nRBC 0.0 0.0 - 0.2 %   Neutrophils Relative % 30 %   Neutro Abs 1.2 (L) 1.7 - 7.7 K/uL   Lymphocytes Relative 52 %   Lymphs Abs 2.0 0.7 - 4.0 K/uL   Monocytes Relative 17 %   Monocytes Absolute 0.7 0.1 - 1.0 K/uL   Eosinophils Relative 0 %   Eosinophils Absolute 0.0 0.0 - 0.5 K/uL   Basophils Relative 0 %   Basophils Absolute 0.0 0.0 - 0.1 K/uL   Immature Granulocytes 1 %   Abs Immature Granulocytes 0.03 0.00 - 0.07 K/uL   Acanthocytes PRESENT    Ovalocytes PRESENT   Vancomycin, trough  Result Value Ref Range   Vancomycin Tr 10 (L) 15 - 20 ug/mL  Glucose, capillary  Result Value Ref Range   Glucose-Capillary 249 (H) 70 - 99 mg/dL  CBC  Result Value Ref Range   WBC 4.8 4.0 - 10.5 K/uL   RBC 2.67 (L) 4.22 - 5.81 MIL/uL   Hemoglobin  6.8 (LL) 13.0 - 17.0 g/dL   HCT 22.4 (L) 39.0 - 52.0 %   MCV 83.9 80.0 - 100.0 fL   MCH 25.5 (L) 26.0 - 34.0 pg   MCHC 30.4 30.0 - 36.0 g/dL   RDW 22.2 (H) 11.5 - 15.5 %   Platelets 126 (L) 150 - 400 K/uL   nRBC 0.4 (H) 0.0 - 0.2 %  Hemoglobin A1c  Result Value Ref Range   Hgb A1c MFr Bld 7.7 (H) 4.8 -  5.6 %   Mean Plasma Glucose 174.29 mg/dL  Glucose, capillary  Result Value Ref Range   Glucose-Capillary 166 (H) 70 - 99 mg/dL  Glucose, capillary  Result Value Ref Range   Glucose-Capillary 179 (H) 70 - 99 mg/dL  CBC  Result Value Ref Range   WBC 3.9 (L) 4.0 - 10.5 K/uL   RBC 3.40 (L) 4.22 - 5.81 MIL/uL   Hemoglobin 8.7 (L) 13.0 - 17.0 g/dL   HCT 29.2 (L) 39.0 - 52.0 %   MCV 85.9 80.0 - 100.0 fL   MCH 25.6 (L) 26.0 - 34.0 pg   MCHC 29.8 (L) 30.0 - 36.0 g/dL   RDW 19.9 (H) 11.5 - 15.5 %   Platelets 105 (L) 150 - 400 K/uL   nRBC 0.0 0.0 - 0.2 %  Basic metabolic panel  Result Value Ref Range   Sodium 143 135 - 145 mmol/L   Potassium 3.9 3.5 - 5.1 mmol/L   Chloride 106 98 - 111 mmol/L   CO2 26 22 - 32 mmol/L   Glucose, Bld 161 (H) 70 - 99 mg/dL   BUN 40 (H) 8 - 23 mg/dL   Creatinine, Ser 2.28 (H) 0.61 - 1.24 mg/dL   Calcium 8.6 (L) 8.9 - 10.3 mg/dL   GFR calc non Af Amer 25 (L) >60 mL/min   GFR calc Af Amer 29 (L) >60 mL/min   Anion gap 11 5 - 15  Glucose, capillary  Result Value Ref Range   Glucose-Capillary 133 (H) 70 - 99 mg/dL  CBC with Differential/Platelet  Result Value Ref Range   WBC 4.7 4.0 - 10.5 K/uL   RBC 3.42 (L) 4.22 - 5.81 MIL/uL   Hemoglobin 9.0 (L) 13.0 - 17.0 g/dL   HCT 28.9 (L) 39.0 - 52.0 %   MCV 84.5 80.0 - 100.0 fL   MCH 26.3 26.0 - 34.0 pg   MCHC 31.1 30.0 - 36.0 g/dL   RDW 20.0 (H) 11.5 - 15.5 %   Platelets 112 (L) 150 - 400 K/uL   nRBC 0.0 0.0 - 0.2 %   Neutrophils Relative % 42 %   Neutro Abs 2.0 1.7 - 7.7 K/uL   Lymphocytes Relative 38 %   Lymphs Abs 1.8 0.7 - 4.0 K/uL   Monocytes Relative 15 %   Monocytes Absolute 0.7 0.1 - 1.0  K/uL   Eosinophils Relative 0 %   Eosinophils Absolute 0.0 0.0 - 0.5 K/uL   Basophils Relative 0 %   Basophils Absolute 0.0 0.0 - 0.1 K/uL   WBC Morphology      MODERATE LEFT SHIFT (>5% METAS AND MYELOS,OCC PRO NOTED)   Other 4 %   Metamyelocytes Relative 1 %   Abs Immature Granulocytes 0.00 0.00 - 0.07 K/uL   Schistocytes PRESENT   Glucose, capillary  Result Value Ref Range   Glucose-Capillary 201 (H) 70 - 99 mg/dL  CK  Result Value Ref Range   Total CK 22 (L) 49 - 397 U/L  Glucose, capillary  Result Value Ref Range   Glucose-Capillary 189 (H) 70 - 99 mg/dL  Comprehensive metabolic panel  Result Value Ref Range   Sodium 140 135 - 145 mmol/L   Potassium 4.0 3.5 - 5.1 mmol/L   Chloride 107 98 - 111 mmol/L   CO2 20 (L) 22 - 32 mmol/L   Glucose, Bld 188 (H) 70 - 99 mg/dL   BUN 38 (H) 8 - 23 mg/dL   Creatinine, Ser 2.09 (H) 0.61 - 1.24 mg/dL  Calcium 8.4 (L) 8.9 - 10.3 mg/dL   Total Protein 7.3 6.5 - 8.1 g/dL   Albumin 2.3 (L) 3.5 - 5.0 g/dL   AST 20 15 - 41 U/L   ALT 19 0 - 44 U/L   Alkaline Phosphatase 55 38 - 126 U/L   Total Bilirubin 1.0 0.3 - 1.2 mg/dL   GFR calc non Af Amer 28 (L) >60 mL/min   GFR calc Af Amer 32 (L) >60 mL/min   Anion gap 13 5 - 15  Magnesium  Result Value Ref Range   Magnesium 2.0 1.7 - 2.4 mg/dL  Glucose, capillary  Result Value Ref Range   Glucose-Capillary 252 (H) 70 - 99 mg/dL  CBC with Differential/Platelet  Result Value Ref Range   WBC 8.6 4.0 - 10.5 K/uL   RBC 3.50 (L) 4.22 - 5.81 MIL/uL   Hemoglobin 9.0 (L) 13.0 - 17.0 g/dL   HCT 28.1 (L) 39.0 - 52.0 %   MCV 80.3 80.0 - 100.0 fL   MCH 25.7 (L) 26.0 - 34.0 pg   MCHC 32.0 30.0 - 36.0 g/dL   RDW 20.1 (H) 11.5 - 15.5 %   Platelets 115 (L) 150 - 400 K/uL   nRBC 0.0 0.0 - 0.2 %   Neutrophils Relative % 56 %   Neutro Abs 4.9 1.7 - 7.7 K/uL   Lymphocytes Relative 23 %   Lymphs Abs 1.9 0.7 - 4.0 K/uL   Monocytes Relative 19 %   Monocytes Absolute 1.6 (H) 0.1 - 1.0 K/uL   Eosinophils  Relative 0 %   Eosinophils Absolute 0.0 0.0 - 0.5 K/uL   Basophils Relative 0 %   Basophils Absolute 0.0 0.0 - 0.1 K/uL   Immature Granulocytes 2 %   Abs Immature Granulocytes 0.16 (H) 0.00 - 0.07 K/uL  Pathologist smear review  Result Value Ref Range   Path Review Rare blast.   Glucose, capillary  Result Value Ref Range   Glucose-Capillary 158 (H) 70 - 99 mg/dL  Glucose, capillary  Result Value Ref Range   Glucose-Capillary 150 (H) 70 - 99 mg/dL  Glucose, capillary  Result Value Ref Range   Glucose-Capillary 182 (H) 70 - 99 mg/dL  CBC  Result Value Ref Range   WBC 7.2 4.0 - 10.5 K/uL   RBC 3.24 (L) 4.22 - 5.81 MIL/uL   Hemoglobin 8.4 (L) 13.0 - 17.0 g/dL   HCT 28.0 (L) 39.0 - 52.0 %   MCV 86.4 80.0 - 100.0 fL   MCH 25.9 (L) 26.0 - 34.0 pg   MCHC 30.0 30.0 - 36.0 g/dL   RDW 20.5 (H) 11.5 - 15.5 %   Platelets 93 (L) 150 - 400 K/uL   nRBC 0.0 0.0 - 0.2 %  Basic metabolic panel  Result Value Ref Range   Sodium 140 135 - 145 mmol/L   Potassium 3.6 3.5 - 5.1 mmol/L   Chloride 105 98 - 111 mmol/L   CO2 24 22 - 32 mmol/L   Glucose, Bld 168 (H) 70 - 99 mg/dL   BUN 47 (H) 8 - 23 mg/dL   Creatinine, Ser 2.29 (H) 0.61 - 1.24 mg/dL   Calcium 8.4 (L) 8.9 - 10.3 mg/dL   GFR calc non Af Amer 25 (L) >60 mL/min   GFR calc Af Amer 29 (L) >60 mL/min   Anion gap 11 5 - 15  Glucose, capillary  Result Value Ref Range   Glucose-Capillary 233 (H) 70 - 99 mg/dL  Glucose, capillary  Result  Value Ref Range   Glucose-Capillary 199 (H) 70 - 99 mg/dL  Glucose, capillary  Result Value Ref Range   Glucose-Capillary 160 (H) 70 - 99 mg/dL  Glucose, capillary  Result Value Ref Range   Glucose-Capillary 227 (H) 70 - 99 mg/dL  CBC  Result Value Ref Range   WBC 5.2 4.0 - 10.5 K/uL   RBC 2.82 (L) 4.22 - 5.81 MIL/uL   Hemoglobin 7.2 (L) 13.0 - 17.0 g/dL   HCT 24.5 (L) 39.0 - 52.0 %   MCV 86.9 80.0 - 100.0 fL   MCH 25.5 (L) 26.0 - 34.0 pg   MCHC 29.4 (L) 30.0 - 36.0 g/dL   RDW 20.9 (H) 11.5 -  15.5 %   Platelets 95 (L) 150 - 400 K/uL   nRBC 0.0 0.0 - 0.2 %  Basic metabolic panel  Result Value Ref Range   Sodium 139 135 - 145 mmol/L   Potassium 3.6 3.5 - 5.1 mmol/L   Chloride 105 98 - 111 mmol/L   CO2 24 22 - 32 mmol/L   Glucose, Bld 132 (H) 70 - 99 mg/dL   BUN 50 (H) 8 - 23 mg/dL   Creatinine, Ser 2.20 (H) 0.61 - 1.24 mg/dL   Calcium 8.0 (L) 8.9 - 10.3 mg/dL   GFR calc non Af Amer 26 (L) >60 mL/min   GFR calc Af Amer 31 (L) >60 mL/min   Anion gap 10 5 - 15  Glucose, capillary  Result Value Ref Range   Glucose-Capillary 152 (H) 70 - 99 mg/dL  Glucose, capillary  Result Value Ref Range   Glucose-Capillary 166 (H) 70 - 99 mg/dL  Glucose, capillary  Result Value Ref Range   Glucose-Capillary 134 (H) 70 - 99 mg/dL  Lactate dehydrogenase  Result Value Ref Range   LDH 962 (H) 98 - 192 U/L  Protein, total  Result Value Ref Range   Total Protein 6.3 (L) 6.5 - 8.1 g/dL  Glucose, capillary  Result Value Ref Range   Glucose-Capillary 152 (H) 70 - 99 mg/dL  CBC  Result Value Ref Range   WBC 4.4 4.0 - 10.5 K/uL   RBC 3.11 (L) 4.22 - 5.81 MIL/uL   Hemoglobin 8.1 (L) 13.0 - 17.0 g/dL   HCT 27.5 (L) 39.0 - 52.0 %   MCV 88.4 80.0 - 100.0 fL   MCH 26.0 26.0 - 34.0 pg   MCHC 29.5 (L) 30.0 - 36.0 g/dL   RDW 19.2 (H) 11.5 - 15.5 %   Platelets 81 (L) 150 - 400 K/uL   nRBC 0.0 0.0 - 0.2 %  Basic metabolic panel  Result Value Ref Range   Sodium 140 135 - 145 mmol/L   Potassium 3.8 3.5 - 5.1 mmol/L   Chloride 107 98 - 111 mmol/L   CO2 22 22 - 32 mmol/L   Glucose, Bld 127 (H) 70 - 99 mg/dL   BUN 45 (H) 8 - 23 mg/dL   Creatinine, Ser 2.29 (H) 0.61 - 1.24 mg/dL   Calcium 8.1 (L) 8.9 - 10.3 mg/dL   GFR calc non Af Amer 25 (L) >60 mL/min   GFR calc Af Amer 29 (L) >60 mL/min   Anion gap 11 5 - 15  Glucose, capillary  Result Value Ref Range   Glucose-Capillary 151 (H) 70 - 99 mg/dL  Glucose, capillary  Result Value Ref Range   Glucose-Capillary 109 (H) 70 - 99 mg/dL    Glucose, capillary  Result Value Ref Range   Glucose-Capillary  110 (H) 70 - 99 mg/dL  Lactate dehydrogenase (pleural or peritoneal fluid)  Result Value Ref Range   LD, Fluid 269 (H) 3 - 23 U/L   Fluid Type-FLDH THORACENTESIS   Body fluid cell count with differential  Result Value Ref Range   Fluid Type-FCT THORACENTESIS    Color, Fluid STRAW (A) YELLOW   Appearance, Fluid HAZY (A) CLEAR   WBC, Fluid 577 0 - 1,000 cu mm   Neutrophil Count, Fluid 0 0 - 25 %   Lymphs, Fluid 34 %   Monocyte-Macrophage-Serous Fluid 66 50 - 90 %   Eos, Fluid 0 %   Other Cells, Fluid 20 %  Protein, pleural or peritoneal fluid  Result Value Ref Range   Total protein, fluid <3.0 g/dL   Fluid Type-FTP THORACENTESIS   PH, Body Fluid  Result Value Ref Range   pH, Body Fluid 7.7 Not Estab.   Source of Sample THO   Glucose, capillary  Result Value Ref Range   Glucose-Capillary 230 (H) 70 - 99 mg/dL  CBC  Result Value Ref Range   WBC 5.5 4.0 - 10.5 K/uL   RBC 2.93 (L) 4.22 - 5.81 MIL/uL   Hemoglobin 7.9 (L) 13.0 - 17.0 g/dL   HCT 25.4 (L) 39.0 - 52.0 %   MCV 86.7 80.0 - 100.0 fL   MCH 27.0 26.0 - 34.0 pg   MCHC 31.1 30.0 - 36.0 g/dL   RDW 19.9 (H) 11.5 - 15.5 %   Platelets 78 (L) 150 - 400 K/uL   nRBC 0.4 (H) 0.0 - 0.2 %  Basic metabolic panel  Result Value Ref Range   Sodium 140 135 - 145 mmol/L   Potassium 3.7 3.5 - 5.1 mmol/L   Chloride 104 98 - 111 mmol/L   CO2 24 22 - 32 mmol/L   Glucose, Bld 121 (H) 70 - 99 mg/dL   BUN 47 (H) 8 - 23 mg/dL   Creatinine, Ser 2.08 (H) 0.61 - 1.24 mg/dL   Calcium 7.8 (L) 8.9 - 10.3 mg/dL   GFR calc non Af Amer 28 (L) >60 mL/min   GFR calc Af Amer 33 (L) >60 mL/min   Anion gap 12 5 - 15  Glucose, capillary  Result Value Ref Range   Glucose-Capillary 220 (H) 70 - 99 mg/dL  Glucose, capillary  Result Value Ref Range   Glucose-Capillary 181 (H) 70 - 99 mg/dL  Glucose, capillary  Result Value Ref Range   Glucose-Capillary 106 (H) 70 - 99 mg/dL  Glucose,  capillary  Result Value Ref Range   Glucose-Capillary 153 (H) 70 - 99 mg/dL  Urinalysis, Routine w reflex microscopic  Result Value Ref Range   Color, Urine YELLOW YELLOW   APPearance HAZY (A) CLEAR   Specific Gravity, Urine 1.013 1.005 - 1.030   pH 5.0 5.0 - 8.0   Glucose, UA NEGATIVE NEGATIVE mg/dL   Hgb urine dipstick MODERATE (A) NEGATIVE   Bilirubin Urine NEGATIVE NEGATIVE   Ketones, ur NEGATIVE NEGATIVE mg/dL   Protein, ur NEGATIVE NEGATIVE mg/dL   Nitrite NEGATIVE NEGATIVE   Leukocytes, UA NEGATIVE NEGATIVE   RBC / HPF 11-20 0 - 5 RBC/hpf   WBC, UA 0-5 0 - 5 WBC/hpf   Bacteria, UA RARE (A) NONE SEEN   Squamous Epithelial / LPF 0-5 0 - 5   Mucus PRESENT    Hyaline Casts, UA PRESENT   CBC  Result Value Ref Range   WBC 5.4 4.0 - 10.5 K/uL  RBC 3.00 (L) 4.22 - 5.81 MIL/uL   Hemoglobin 7.9 (L) 13.0 - 17.0 g/dL   HCT 26.5 (L) 39.0 - 52.0 %   MCV 88.3 80.0 - 100.0 fL   MCH 26.3 26.0 - 34.0 pg   MCHC 29.8 (L) 30.0 - 36.0 g/dL   RDW 19.7 (H) 11.5 - 15.5 %   Platelets 68 (L) 150 - 400 K/uL   nRBC 0.0 0.0 - 0.2 %  Basic metabolic panel  Result Value Ref Range   Sodium 141 135 - 145 mmol/L   Potassium 4.9 3.5 - 5.1 mmol/L   Chloride 104 98 - 111 mmol/L   CO2 25 22 - 32 mmol/L   Glucose, Bld 143 (H) 70 - 99 mg/dL   BUN 44 (H) 8 - 23 mg/dL   Creatinine, Ser 2.09 (H) 0.61 - 1.24 mg/dL   Calcium 8.0 (L) 8.9 - 10.3 mg/dL   GFR calc non Af Amer 28 (L) >60 mL/min   GFR calc Af Amer 32 (L) >60 mL/min   Anion gap 12 5 - 15  Glucose, capillary  Result Value Ref Range   Glucose-Capillary 140 (H) 70 - 99 mg/dL  Glucose, capillary  Result Value Ref Range   Glucose-Capillary 223 (H) 70 - 99 mg/dL  Glucose, capillary  Result Value Ref Range   Glucose-Capillary 182 (H) 70 - 99 mg/dL  Glucose, capillary  Result Value Ref Range   Glucose-Capillary 100 (H) 70 - 99 mg/dL  Glucose, capillary  Result Value Ref Range   Glucose-Capillary 150 (H) 70 - 99 mg/dL  Glucose, capillary    Result Value Ref Range   Glucose-Capillary 190 (H) 70 - 99 mg/dL  CBC  Result Value Ref Range   WBC 5.0 4.0 - 10.5 K/uL   RBC 3.17 (L) 4.22 - 5.81 MIL/uL   Hemoglobin 8.4 (L) 13.0 - 17.0 g/dL   HCT 28.4 (L) 39.0 - 52.0 %   MCV 89.6 80.0 - 100.0 fL   MCH 26.5 26.0 - 34.0 pg   MCHC 29.6 (L) 30.0 - 36.0 g/dL   RDW 20.1 (H) 11.5 - 15.5 %   Platelets 57 (L) 150 - 400 K/uL   nRBC 0.0 0.0 - 0.2 %  Basic metabolic panel  Result Value Ref Range   Sodium 139 135 - 145 mmol/L   Potassium 5.3 (H) 3.5 - 5.1 mmol/L   Chloride 103 98 - 111 mmol/L   CO2 26 22 - 32 mmol/L   Glucose, Bld 139 (H) 70 - 99 mg/dL   BUN 41 (H) 8 - 23 mg/dL   Creatinine, Ser 1.99 (H) 0.61 - 1.24 mg/dL   Calcium 8.4 (L) 8.9 - 10.3 mg/dL   GFR calc non Af Amer 30 (L) >60 mL/min   GFR calc Af Amer 34 (L) >60 mL/min   Anion gap 10 5 - 15  Glucose, capillary  Result Value Ref Range   Glucose-Capillary 182 (H) 70 - 99 mg/dL  Glucose, capillary  Result Value Ref Range   Glucose-Capillary 137 (H) 70 - 99 mg/dL  Glucose, capillary  Result Value Ref Range   Glucose-Capillary 197 (H) 70 - 99 mg/dL  Glucose, capillary  Result Value Ref Range   Glucose-Capillary 194 (H) 70 - 99 mg/dL  Glucose, capillary  Result Value Ref Range   Glucose-Capillary 204 (H) 70 - 99 mg/dL  Glucose, capillary  Result Value Ref Range   Glucose-Capillary 142 (H) 70 - 99 mg/dL  Glucose, capillary  Result Value Ref Range  Glucose-Capillary 248 (H) 70 - 99 mg/dL  Glucose, capillary  Result Value Ref Range   Glucose-Capillary 213 (H) 70 - 99 mg/dL  Glucose, capillary  Result Value Ref Range   Glucose-Capillary 226 (H) 70 - 99 mg/dL  CBC with Differential/Platelet  Result Value Ref Range   WBC 4.3 4.0 - 10.5 K/uL   RBC 2.81 (L) 4.22 - 5.81 MIL/uL   Hemoglobin 7.7 (L) 13.0 - 17.0 g/dL   HCT 25.2 (L) 39.0 - 52.0 %   MCV 89.7 80.0 - 100.0 fL   MCH 27.4 26.0 - 34.0 pg   MCHC 30.6 30.0 - 36.0 g/dL   RDW 18.9 (H) 11.5 - 15.5 %    Platelets 48 (L) 150 - 400 K/uL   nRBC 0.0 0.0 - 0.2 %   Neutrophils Relative % 48 %   Lymphocytes Relative 35 %   Monocytes Relative 17 %   Eosinophils Relative 0 %   Basophils Relative 0 %   Band Neutrophils 0 %   Metamyelocytes Relative 0 %   Myelocytes 0 %   Promyelocytes Relative 0 %   Blasts 0 %   nRBC 0 0 /100 WBC   Other 0 %   Neutro Abs 2.1 1.7 - 7.7 K/uL   Lymphs Abs 1.5 0.7 - 4.0 K/uL   Monocytes Absolute 0.7 0.1 - 1.0 K/uL   Eosinophils Absolute 0.0 0.0 - 0.5 K/uL   Basophils Absolute 0.0 0.0 - 0.1 K/uL   RBC Morphology POLYCHROMASIA PRESENT    WBC Morphology ATYPICAL LYMPHOCYTES   Comprehensive metabolic panel  Result Value Ref Range   Sodium 138 135 - 145 mmol/L   Potassium 4.4 3.5 - 5.1 mmol/L   Chloride 100 98 - 111 mmol/L   CO2 28 22 - 32 mmol/L   Glucose, Bld 135 (H) 70 - 99 mg/dL   BUN 36 (H) 8 - 23 mg/dL   Creatinine, Ser 1.74 (H) 0.61 - 1.24 mg/dL   Calcium 8.2 (L) 8.9 - 10.3 mg/dL   Total Protein 6.9 6.5 - 8.1 g/dL   Albumin 1.6 (L) 3.5 - 5.0 g/dL   AST 23 15 - 41 U/L   ALT 34 0 - 44 U/L   Alkaline Phosphatase 64 38 - 126 U/L   Total Bilirubin 0.5 0.3 - 1.2 mg/dL   GFR calc non Af Amer 35 (L) >60 mL/min   GFR calc Af Amer 41 (L) >60 mL/min   Anion gap 10 5 - 15  Magnesium  Result Value Ref Range   Magnesium 2.2 1.7 - 2.4 mg/dL  Glucose, capillary  Result Value Ref Range   Glucose-Capillary 113 (H) 70 - 99 mg/dL  Glucose, capillary  Result Value Ref Range   Glucose-Capillary 233 (H) 70 - 99 mg/dL  Glucose, capillary  Result Value Ref Range   Glucose-Capillary 229 (H) 70 - 99 mg/dL  Glucose, capillary  Result Value Ref Range   Glucose-Capillary 188 (H) 70 - 99 mg/dL  Glucose, capillary  Result Value Ref Range   Glucose-Capillary 112 (H) 70 - 99 mg/dL  CBC with Differential/Platelet  Result Value Ref Range   WBC 3.6 (L) 4.0 - 10.5 K/uL   RBC 3.03 (L) 4.22 - 5.81 MIL/uL   Hemoglobin 8.2 (L) 13.0 - 17.0 g/dL   HCT 27.1 (L) 39.0 - 52.0 %    MCV 89.4 80.0 - 100.0 fL   MCH 27.1 26.0 - 34.0 pg   MCHC 30.3 30.0 - 36.0 g/dL   RDW 18.6 (  H) 11.5 - 15.5 %   Platelets 59 (L) 150 - 400 K/uL   nRBC 0.0 0.0 - 0.2 %   Neutrophils Relative % 33 %   Neutro Abs 1.3 (L) 1.7 - 7.7 K/uL   Band Neutrophils 3 %   Lymphocytes Relative 37 %   Lymphs Abs 1.3 0.7 - 4.0 K/uL   Monocytes Relative 10 %   Monocytes Absolute 0.4 0.1 - 1.0 K/uL   Eosinophils Relative 1 %   Eosinophils Absolute 0.0 0.0 - 0.5 K/uL   Basophils Relative 0 %   Basophils Absolute 0.0 0.0 - 0.1 K/uL   Metamyelocytes Relative 2 %   Myelocytes 1 %   Promyelocytes Relative 1 %   Blasts 12 %   Abs Immature Granulocytes 0.10 (H) 0.00 - 0.07 K/uL   Ovalocytes PRESENT   Glucose, capillary  Result Value Ref Range   Glucose-Capillary 190 (H) 70 - 99 mg/dL  Glucose, capillary  Result Value Ref Range   Glucose-Capillary 210 (H) 70 - 99 mg/dL  CBC with Differential/Platelet  Result Value Ref Range   WBC 3.1 (L) 4.0 - 10.5 K/uL   RBC 2.69 (L) 4.22 - 5.81 MIL/uL   Hemoglobin 7.2 (L) 13.0 - 17.0 g/dL   HCT 24.5 (L) 39.0 - 52.0 %   MCV 91.1 80.0 - 100.0 fL   MCH 26.8 26.0 - 34.0 pg   MCHC 29.4 (L) 30.0 - 36.0 g/dL   RDW 19.1 (H) 11.5 - 15.5 %   Platelets 57 (L) 150 - 400 K/uL   nRBC 0.0 0.0 - 0.2 %   Neutrophils Relative % 39 %   Lymphocytes Relative 43 %   Monocytes Relative 18 %   Eosinophils Relative 0 %   Basophils Relative 0 %   Band Neutrophils 0 %   Metamyelocytes Relative 0 %   Myelocytes 0 %   Promyelocytes Relative 0 %   Blasts 0 %   nRBC 0 0 /100 WBC   Other 0 %   Neutro Abs 1.2 (L) 1.7 - 7.7 K/uL   Lymphs Abs 1.3 0.7 - 4.0 K/uL   Monocytes Absolute 0.6 0.1 - 1.0 K/uL   Eosinophils Absolute 0.0 0.0 - 0.5 K/uL   Basophils Absolute 0.0 0.0 - 0.1 K/uL   RBC Morphology ELLIPTOCYTES    WBC Morphology ATYPICAL LYMPHOCYTES   Glucose, capillary  Result Value Ref Range   Glucose-Capillary 175 (H) 70 - 99 mg/dL  Glucose, capillary  Result Value Ref Range    Glucose-Capillary 126 (H) 70 - 99 mg/dL  Glucose, capillary  Result Value Ref Range   Glucose-Capillary 135 (H) 70 - 99 mg/dL  Glucose, capillary  Result Value Ref Range   Glucose-Capillary 185 (H) 70 - 99 mg/dL  CBC with Differential/Platelet  Result Value Ref Range   WBC 3.7 (L) 4.0 - 10.5 K/uL   RBC 2.88 (L) 4.22 - 5.81 MIL/uL   Hemoglobin 7.7 (L) 13.0 - 17.0 g/dL   HCT 26.1 (L) 39.0 - 52.0 %   MCV 90.6 80.0 - 100.0 fL   MCH 26.7 26.0 - 34.0 pg   MCHC 29.5 (L) 30.0 - 36.0 g/dL   RDW 18.7 (H) 11.5 - 15.5 %   Platelets 63 (L) 150 - 400 K/uL   nRBC 0.0 0.0 - 0.2 %   Neutrophils Relative % 26 %   Neutro Abs 1.0 (L) 1.7 - 7.7 K/uL   Lymphocytes Relative 47 %   Lymphs Abs 1.7 0.7 - 4.0 K/uL  Monocytes Relative 20 %   Monocytes Absolute 0.7 0.1 - 1.0 K/uL   Eosinophils Relative 0 %   Eosinophils Absolute 0.0 0.0 - 0.5 K/uL   Basophils Relative 0 %   Basophils Absolute 0.0 0.0 - 0.1 K/uL   Immature Granulocytes 7 %   Abs Immature Granulocytes 0.26 (H) 0.00 - 0.07 K/uL   Polychromasia PRESENT   Glucose, capillary  Result Value Ref Range   Glucose-Capillary 197 (H) 70 - 99 mg/dL  Glucose, capillary  Result Value Ref Range   Glucose-Capillary 114 (H) 70 - 99 mg/dL  Basic metabolic panel  Result Value Ref Range   Sodium 137 135 - 145 mmol/L   Potassium 4.7 3.5 - 5.1 mmol/L   Chloride 100 98 - 111 mmol/L   CO2 28 22 - 32 mmol/L   Glucose, Bld 122 (H) 70 - 99 mg/dL   BUN 32 (H) 8 - 23 mg/dL   Creatinine, Ser 1.68 (H) 0.61 - 1.24 mg/dL   Calcium 8.3 (L) 8.9 - 10.3 mg/dL   GFR calc non Af Amer 36 (L) >60 mL/min   GFR calc Af Amer 42 (L) >60 mL/min   Anion gap 9 5 - 15  Glucose, capillary  Result Value Ref Range   Glucose-Capillary 230 (H) 70 - 99 mg/dL  Glucose, capillary  Result Value Ref Range   Glucose-Capillary 161 (H) 70 - 99 mg/dL  CBC with Differential/Platelet  Result Value Ref Range   WBC 3.1 (L) 4.0 - 10.5 K/uL   RBC 2.84 (L) 4.22 - 5.81 MIL/uL    Hemoglobin 7.6 (L) 13.0 - 17.0 g/dL   HCT 25.8 (L) 39.0 - 52.0 %   MCV 90.8 80.0 - 100.0 fL   MCH 26.8 26.0 - 34.0 pg   MCHC 29.5 (L) 30.0 - 36.0 g/dL   RDW 18.5 (H) 11.5 - 15.5 %   Platelets 63 (L) 150 - 400 K/uL   nRBC 0.0 0.0 - 0.2 %   Neutrophils Relative % 23 %   Neutro Abs 0.7 (L) 1.7 - 7.7 K/uL   Lymphocytes Relative 52 %   Lymphs Abs 1.6 0.7 - 4.0 K/uL   Monocytes Relative 19 %   Monocytes Absolute 0.6 0.1 - 1.0 K/uL   Eosinophils Relative 0 %   Eosinophils Absolute 0.0 0.0 - 0.5 K/uL   Basophils Relative 0 %   Basophils Absolute 0.0 0.0 - 0.1 K/uL   Immature Granulocytes 6 %   Abs Immature Granulocytes 0.19 (H) 0.00 - 0.07 K/uL   Polychromasia PRESENT    Ovalocytes PRESENT   Glucose, capillary  Result Value Ref Range   Glucose-Capillary 216 (H) 70 - 99 mg/dL  Glucose, capillary  Result Value Ref Range   Glucose-Capillary 111 (H) 70 - 99 mg/dL  Glucose, capillary  Result Value Ref Range   Glucose-Capillary 175 (H) 70 - 99 mg/dL  I-Stat CG4 Lactic Acid, ED  Result Value Ref Range   Lactic Acid, Venous 1.43 0.5 - 1.9 mmol/L  POC occult blood, ED Provider will collect  Result Value Ref Range   Fecal Occult Bld NEGATIVE NEGATIVE  I-Stat CG4 Lactic Acid, ED  Result Value Ref Range   Lactic Acid, Venous 0.52 0.5 - 1.9 mmol/L  CBG monitoring, ED  Result Value Ref Range   Glucose-Capillary 159 (H) 70 - 99 mg/dL  ECHOCARDIOGRAM COMPLETE  Result Value Ref Range   Weight 2,941.82 oz   Height 69 in   BP 187/73 mmHg  Type and screen  Result Value Ref Range   ABO/RH(D) O POS    Antibody Screen NEG    Sample Expiration 05/31/2018    Unit Number [id number]    Blood Component Type RED CELLS,LR    Unit division 00    Status of Unit ISSUED,FINAL    Transfusion Status OK TO TRANSFUSE    Crossmatch Result      Compatible Performed at [facility], [place] [address], [place], [place] [address]    Unit Number [id number]    Blood Component Type RED CELLS,LR     Unit division 00    Status of Unit ISSUED,FINAL    Transfusion Status OK TO TRANSFUSE    Crossmatch Result Compatible   Prepare RBC  Result Value Ref Range   Order Confirmation      ORDER PROCESSED BY BLOOD BANK Performed at [facility], [place] [address], [place], [place] [address]   Prepare RBC  Result Value Ref Range   Order Confirmation      ORDER PROCESSED BY BLOOD BANK Performed at [facility], [place] [address] [place], [place] [address]   Prepare RBC  Result Value Ref Range   Order Confirmation      ORDER PROCESSED BY BLOOD BANK Performed at [facility], [place] [address], [place], [place] [address]   Type and screen [place]  Result Value Ref Range   ABO/RH(D) O POS    Antibody Screen NEG    Sample Expiration 06/05/2018    Unit Number [id number]    Blood Component Type RED CELLS,LR    Unit division 00    Status of Unit ISSUED,FINAL    Transfusion Status OK TO TRANSFUSE    Crossmatch Result      Compatible Performed at [facility], [place] [address], [place], [place] [address]   BP[facility]  Result Value Ref Range   ISSUE DATE / TIME 425956387564    Blood Product Unit Number [id number]    PRODUCT CODE [id number]    Unit Type and Rh 5100    Blood Product Expiration Date 932355732202    ISSUE DATE / TIME 542706237628    Blood Product Unit Number [id number]    PRODUCT CODE [id number]    Unit Type and Rh 5100    Blood Product Expiration Date 462703500938   BPAM RBC  Result Value Ref Range   ISSUE DATE / TIME 182993716967    Blood Product Unit Number [id number]    PRODUCT CODE [id number]    Unit Type and Rh 5100    Blood Product Expiration Date 235361443154    Dg Chest 2 View  Result Date: 05/01/2018 CLINICAL DATA:  Acute onset of central chest pain and cough. EXAM: CHEST - 2 VIEW COMPARISON:  Chest radiograph performed 04/20/2018 FINDINGS: The lungs are well-aerated. Medial right basilar airspace opacity may  reflect pneumonia or asymmetric pulmonary edema. Mild vascular congestion is noted. A small left pleural effusion is suspected. There is no evidence of pneumothorax. The heart is normal in size; the mediastinal contour is within normal limits. No acute osseous abnormalities are seen. IMPRESSION: Medial right basilar airspace opacity may reflect pneumonia or asymmetric pulmonary edema. Mild vascular congestion noted. Suspect small left pleural effusion. Electronically Signed   By: Garald Balding M.D.   On: 05/01/2018 06:36   Dg Chest Port 1 View  Result Date: 05/28/2018 CLINICAL DATA:  Chest pain. Prostate cancer. Lymphoma. EXAM: PORTABLE CHEST 1 VIEW COMPARISON:  05/01/2018 FINDINGS: Artifact overlies the chest. Mild cardiomegaly and aortic atherosclerosis. Newly seen abnormal density in the right upper lobe and in both lower lobes most consistent with pneumonia. Some pleural density noted on the left as well. IMPRESSION: New pulmonary density in the right apex and at both lung bases most consistent with pneumonia. Small associated left effusion. Electronically Signed   By: Nelson Chimes M.D.   On: 05/28/2018 09:07   Dg Chest Port 1 View  Result Date: 05/01/2018 CLINICAL DATA:  Sepsis EXAM: PORTABLE CHEST 1 VIEW COMPARISON:  May 01, 2018 chest radiograph obtained earlier in the day FINDINGS: Currently there Is a minimal left pleural effusion. No edema or consolidation. Heart is upper normal in size with pulmonary vascularity normal. There is aortic atherosclerosis. No adenopathy. There is degenerative change in the thoracic spine. IMPRESSION: Minimal left pleural effusion. No edema or consolidation. Stable cardiac silhouette. There is aortic atherosclerosis. Aortic Atherosclerosis (ICD10-I70.0). Electronically Signed   By: Lowella Grip III M.D.   On: 05/01/2018 18:08   Dg Finger Index Right  Result Date: 05/02/2018 CLINICAL DATA:  Generalized right index finger pain for the past 2 days since  tripping and falling outside while at home. History of diabetes, former smoker. EXAM: RIGHT INDEX FINGER 2+V COMPARISON:  None. FINDINGS: The bones are subjectively adequately mineralized. No acute phalangeal fracture is observed. There is subtle soft tissue calcification adjacent to the radial aspect of the DIP joint but no donor site is observed. The interphalangeal joint spaces are reasonably well-maintained. There is moderate to severe loss of the second MCP joint space. The second metacarpal appears intact. There is diffuse soft tissue swelling. IMPRESSION: Probable contusion of the right index finger. No acute fracture nor dislocation. 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 Electronically Signed   By: David  Martinique M.D.   On: 05/02/2018 13:43    EKG EKG Interpretation  Date/Time:  Sunday May 28 2018 08:34:29 EST Ventricular Rate:  119 PR Interval:    QRS Duration: 122 QT Interval:  355 QTC Calculation: 500 R Axis:   -87 Text Interpretation:  Sinus tachycardia with frequent Premature ventricular complexes Nonspecific IVCD with LAD Non-specific ST-t changes Confirmed by Lajean Saver [phone]) on 05/28/2018 9:34:07 AM   Radiology Dg Chest Port 1 View  Result Date: 05/28/2018 CLINICAL DATA:  Chest pain. Prostate cancer. Lymphoma. EXAM: PORTABLE CHEST 1 VIEW COMPARISON:  05/01/2018 FINDINGS: Artifact overlies the chest. Mild cardiomegaly and aortic atherosclerosis. Newly seen abnormal density in the right upper lobe and in both lower lobes most consistent with pneumonia. Some pleural density noted on the left as well. IMPRESSION: New pulmonary density in the right apex and at both lung bases most consistent with pneumonia. Small associated left effusion. Electronically Signed   By: Nelson Chimes M.D.   On: 05/28/2018 09:07    Procedures Procedures (including critical care time)  Medications Ordered in ED Medications  ceFEPIme (MAXIPIME) 2 g in sodium chloride  0.9 % 100 mL IVPB (has no administration in time range)  vancomycin (VANCOCIN) IVPB 1000 mg/200 mL premix (has no administration in time range)  sodium chloride 0.9 % bolus 1,000 mL (has no administration in time range)     Initial Impression / Assessment and Plan / ED Course  I have reviewed the triage vital signs and the nursing notes.  Pertinent labs & imaging results that were available during my care of the patient were reviewed by me and considered in my medical decision  making (see chart for details).  Iv ns. Labs. Cultures. Cxr.   Reviewed nursing notes and prior charts for additional history.   Stat Iv antibiotics given. Code stroke activated. Ns bolus.   Labs reviewed - hgb low, 7.7.  cxr reviewed - new infiltrates/pna.  Hospitalists consulted for admission re fever, weakness, multilobar pna.   CRITICAL CARE  RE: acute febrile illness/code sepsis, acute severe bilateraeal pna requiring stat iv abx therapy and admission, symptomatic anemia, aki.  Performed by: Mirna Mires Total critical care time: 40 minutes Critical care time was exclusive of separately billable procedures and treating other patients. Critical care was necessary to treat or prevent imminent or life-threatening deterioration. Critical care was time spent personally by me on the following activities: development of treatment plan with patient and/or surrogate as well as nursing, discussions with consultants, evaluation of patient's response to treatment, examination of patient, obtaining history from patient or surrogate, ordering and performing treatments and interventions, ordering and review of laboratory studies, ordering and review of radiographic studies, pulse oximetry and re-evaluation of patient's condition.     Final Clinical Impressions(s) / ED Diagnoses   Final diagnoses:  None    ED Discharge Orders    None           Lajean Saver, MD 06/12/18 1304

## 2018-05-28 NOTE — Progress Notes (Signed)
Pharmacy Antibiotic Note  Ricardo Watkins is a 84 y.o. male admitted on 05/28/2018 with CP and fevers concerning for sepsis. Pharmacy has been consulted for Vancomycin and Cefepime dosing.  SCr 2.11 (BL 1.8-2.1), CrCl~20-30 ml/min.   Vancomycin 750 mg IV Q 24 hrs. Goal AUC 400-550. Expected AUC: 487 SCr used: 2.11   Plan: - Vancomycin 1500 mg IV x 1 dose to load followed by 750 mg IV every 24 hours - Cefepime 2g IV x 1 dose followed by 1g IV every 12 hours - Will continue to follow renal function, culture results, LOT, and antibiotic de-escalation plans   Height: 5\' 9"  (175.3 cm) Weight: 183 lb 13.8 oz (83.4 kg) IBW/kg (Calculated) : 70.7  Temp (24hrs), Avg:102.3 F (39.1 C), Min:102.3 F (39.1 C), Max:102.3 F (39.1 C)  Recent Labs  Lab 05/24/18 1425  WBC 2.7*    Estimated Creatinine Clearance: 26.7 mL/min (A) (by C-G formula based on SCr of 2.02 mg/dL (H)).    Allergies  Allergen Reactions  . Glucophage [Metformin Hydrochloride] Other (See Comments)    Chest pain  . Zetia [Ezetimibe] Other (See Comments)    weakness  . Fenofibrate Rash  . Niacin-Lovastatin Er Rash    Antimicrobials this admission: Vanc 12/8 >> Cefepime 12/8 >>  Dose adjustments this admission: n/a  Microbiology results: 12/8 BCx >> 12/8 UCx >>  Thank you for allowing pharmacy to be a part of this patient's care.  Alycia Rossetti, PharmD, BCPS Clinical Pharmacist Pager: [phone] Clinical phone for 05/28/2018 from 7a-3:30p: [phone] If after 3:30p, please call main pharmacy at: [phone] Please check AMION for all [facility] numbers 05/28/2018 8:40 AM

## 2018-05-28 NOTE — ED Notes (Signed)
Pt arrived to Rm 48 via stretcher - pt alert - spouse and daughter w/pt.

## 2018-05-28 NOTE — ED Triage Notes (Signed)
Pt to ED via private vehicle with c/o chest pain-- took NTG x 1, with relief-- also c/o being "cold" - is warm to touch- temp is 102.3 oral-

## 2018-05-28 NOTE — ED Notes (Addendum)
Pt's spouse feeding pt lunch. Pt took meds w/o difficulty.

## 2018-05-28 NOTE — ED Notes (Signed)
Ricardo Watkins, RT, and requested for her to assist w/assessing pt d/t pt noted w/wheezing. Pt ambulated to bathroom w/assistance of staff x 2 - O2 continues to infuse via n/c.

## 2018-05-28 NOTE — ED Notes (Signed)
Admitting MD assessed pt - pt has improved - no longer w/audible wheezing - spoke w/pt and family - order received to d/c IV fluids and encourage pt to drink and eat.

## 2018-05-28 NOTE — ED Notes (Signed)
Attempted to call report x 1  

## 2018-05-28 NOTE — ED Notes (Signed)
Pt unable to have BM - assisted back to stretcher - c/o generalized weakness worsening and nonproud cough w/audible wheezing - RT recommended IV fluids may need to be decreased - paging Admitting MD.

## 2018-05-28 NOTE — ED Triage Notes (Signed)
Pt here for eval of chest pain, he is cancer patient. Family states he is losing blood from somewhere and his hemoglobin is low.

## 2018-05-28 NOTE — H&P (Signed)
History and Physical    Ricardo Watkins GQQ:[id number] DOB: 18-Jan-1931 DOA: 05/28/2018  PCP: [name], MD (Confirm with patient/family/NH records and if not entered, this has to be entered at [facility] point of entry) Patient coming from: Home  I have personally briefly reviewed patient's old medical records in [place]  Chief Complaint: Weakness and cough  HPI: Ricardo Watkins is a 84 y.o. male with medical history significant of PVD s/p R renal artery stent; remote prostate CA s/p seed radiation; NHL; HTN; HLD; DM; chronic ITP; CHF; and CAD s/p stent (recently deemed not an interventional candidate) presenting with 4 admission in 12 months and 13 ED visits in 12 months who presents today with a complaint chest pain.  Initial evaluation revealed he was found to have general weakness and fever. Symptoms acute onset in past 1-2 days, persistent, moderate. Fever onset in past day,102 this a.m. Pt w recent office visit, noted with progressive anemia, hgb 7.3. Pt denies blood loss, rectal bleeding or melena.  He has myelodysplastic syndrome and is followed by oncology for his pancytopenia.  Pt notes episodic, non productive cough. No sore throat or runny nose. No chest pain. No abd pain. Denies vomiting or diarrhea. Recent poor po intake.  He has been feeling weak over the past 24 to 36 hours.  Pt w recent right index finger infection - pt/family note slowly improving, is still taking doxycycline.   ED Course: Fever of 102.3, pulse of 124, respirations of 17, BP 144/67, creatinine of 2.11, (varies from 2-1.7), close of 169, blood cell count of 4.8 (which is high for him he usually runs between 2.7 and 3.2), globin of 7.7 which is his baseline  Review of Systems: As per HPI otherwise all other systems reviewed and  negative.    Past Medical History:  Diagnosis Date  . Adenomatous colon polyp   . CAD (coronary artery disease)    30% LAD Stenosis, 70% ramus intermedius stenosis, treated with  PTCA and angioplasty by Dr Albertine Patricia 2004  . Cataract    right eye  . CHF (congestive heart failure) ([place])   . Chronic ITP (idiopathic thrombocytopenia) (HCC)   . Cough, persistent 11/04/2015  . Diverticulosis   . DM (diabetes mellitus) ([place])   . DVT (deep venous thrombosis) ([place])    secondary to surgery  . Dyspnea   . Fever 05/01/2018  . Hyperlipidemia   . Hypertension   . Inguinal hernia    right  . Laceration of finger of right hand 05/01/2018   INDEX FINGER  . 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 Microcytic anemia 07/07/2015  . 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 Non Hodgkin's lymphoma ([place])   . Pleural effusion, left 11/04/2015  . Prostate CA ([place]) 09/10/2011   Gleason 3+3 R, 3+4 L lobe May 2007 Rx Radioactive seed implants Dr [name]  . PVD (peripheral vascular disease) ([place])    rt renal artery stent  . Renal insufficiency   . Thrombocytopenia ([place])   . Thrombotic stroke ([place]) 09/10/2011   January 18, 2011 infarct genu & post limb R internal capsule - acute; previous lacunar infarcts/extensive white matter dis  . Thyroid nodule    left lower lobe (annual monitoring).  . Vitamin D deficiency     Past Surgical History:  Procedure Laterality Date  . APPENDECTOMY     patient ?  Marland Kitchen CARDIAC CATHETERIZATION    . CATARACT  EXTRACTION    . CHEST TUBE INSERTION Left 02/10/2016   Procedure: INSERTION PLEURAL DRAINAGE CATHETER;  Surgeon: [name], MD;  Location: [facility];  Service: Thoracic;  Laterality: Left;  . CHEST TUBE INSERTION Left 04/08/2016   Procedure: CHEST TUBE INSERTION;  Surgeon: [name], MD;  Location: [facility];  Service: Thoracic;  Laterality: Left;  . COLONOSCOPY    . CORONARY ANGIOPLASTY    . CYSTOURETHROSCOPY     ROBOTIC ARM NUCLETRON SEED IMPLANTATION OF PROSTATE  . EXPLORATORY LAPAROTOMY     For evaluation of lymphoma  . REMOVAL OF PLEURAL DRAINAGE CATHETER Left 04/08/2016   Procedure: REMOVAL OF PLEURAL  DRAINAGE CATHETER;  Surgeon: [name], MD;  Location: [facility];  Service: Thoracic;  Laterality: Left;    Social History   Social History Narrative  . Not on file     reports that he quit smoking about 59 years ago. His smoking use included pipe and cigarettes. He has a 10.00 pack-year smoking history. He has never used smokeless tobacco. He reports that he does not drink alcohol or use drugs.  Allergies  Allergen Reactions  . Glucophage [Metformin Hydrochloride] Other (See Comments)    Chest pain  . Zetia [Ezetimibe] Other (See Comments)    weakness  . Fenofibrate Rash  . Niacin-Lovastatin Er Rash    Family History  Problem Relation Age of Onset  . Lymphoma Sister   . Prostate cancer Brother   . Breast cancer Sister   . Diabetes Brother   . Diabetes Sister   . Heart disease Brother   . Asthma Son    Prior to Admission medications   Medication Sig Start Date End Date Taking? Authorizing Provider  acetaminophen (TYLENOL) 500 MG tablet Take 1,000 mg by mouth every 6 (six) hours as needed for mild pain, moderate pain or headache.    Yes [provider]  aspirin EC 81 MG EC tablet Take 1 tablet (81 mg total) by mouth daily. 05/04/18 06/03/18 Yes Amin, Ankit Chirag, MD  clopidogrel (PLAVIX) 75 MG tablet TAKE 1 TABLET BY MOUTH ONCE A DAY WITH BREAKFAST Patient taking differently: Take 75 mg by mouth daily.  04/10/18  Yes [name], MD  furosemide (LASIX) 40 MG tablet Take 60mg  daily by mouth. (3 tablets) Except excess swelling or weight gain take an additional 40mg  daily (2 tablets) Patient taking differently: Take 40 mg by mouth daily.  04/22/18  Yes Lady Deutscher, MD  gabapentin (NEURONTIN) 100 MG capsule TAKE 1 CAPSULE BY MOUTH AT BEDTIME Patient taking differently: Take 100 mg by mouth at bedtime.  03/20/18  Yes [name], MD  Insulin Glargine (LANTUS SOLOSTAR) 100 UNIT/ML Solostar Pen INJECT 20 UNITS EVERY NIGHT AT BEDTIME Patient taking  differently: Inject 20 Units into the skin every morning. INJECT 20 UNITS EVERY NIGHT AT BEDTIME 04/22/18  Yes Lady Deutscher, MD  isosorbide mononitrate (IMDUR) 60 MG 24 hr tablet Take 1 tablet (60 mg total) by mouth daily. 05/11/18 06/10/18 Yes [name], MD  loperamide (IMODIUM) 2 MG capsule Take 2 mg by mouth as needed for diarrhea or loose stools.   Yes [provider]  meclizine (ANTIVERT) 12.5 MG tablet Take 1 tablet (12.5 mg total) by mouth 3 (three) times daily as needed for dizziness. 06/27/15  Yes [name], MD  metoprolol succinate (TOPROL-XL) 100 MG 24 hr tablet TAKE 1 TABLET BY MOUTH ONCE DAILY Patient taking differently: Take 100 mg by  mouth daily.  12/28/17  Yes [name], MD  Multiple Vitamin (MULTIVITAMIN WITH MINERALS) TABS tablet Take 1 tablet by mouth every morning.    Yes [provider]  neomycin-bacitracin-polymyxin (NEOSPORIN) ointment Apply 1 application topically as needed for wound care (finger).   Yes [provider]  nitroGLYCERIN (NITROSTAT) 0.4 MG SL tablet Place 1 tablet (0.4 mg total) under the tongue every 5 (five) minutes as needed for chest pain. 05/03/18 05/03/19 Yes Amin, Jeanella Flattery, MD  NON FORMULARY Inject 1 Dose into the vein once a week. Platelet booster   Yes [provider]  OVER THE COUNTER MEDICATION Place 1 drop into both eyes daily as needed (dry eyes). Over the counter lubricating eye drop   Yes [provider]  pravastatin (PRAVACHOL) 40 MG tablet Take 1 tablet (40 mg total) by mouth daily at 6 PM. 05/03/18  Yes Amin, Ankit Chirag, MD  tamsulosin (FLOMAX) 0.4 MG CAPS capsule TAKE 1 CAPSULE BY MOUTH ONCE DAILY Patient taking differently: Take 0.4 mg by mouth daily.  04/11/18  Yes [name], MD  traMADol (ULTRAM) 50 MG tablet Take 1 tablet (50 mg total) by mouth every 6 (six) hours as needed for moderate pain. 01/03/18  Yes [name], MD    Physical  Exam:  Constitutional: NAD, calm, comfortable, chronically ill-appearing Vitals:   05/28/18 0825 05/28/18 0826 05/28/18 0900 05/28/18 1033  BP:   (!) 144/68 (!) 95/57  Pulse: (!) 124  (!) 113 (!) 112  Resp: 17  (!) 24 18  Temp: (!) 102.3 F (39.1 C)     TempSrc: Oral     SpO2: 93%  100% 100%  Weight:  83.4 kg    Height:  5\' 9"  (1.753 m)     Eyes: PERRL, lids and conjunctivae normal ENMT: Mucous membranes are moist. Posterior pharynx clear of any exudate or lesions.Normal dentition.  Neck: normal, supple, no masses, no thyromegaly Respiratory: Coughing, lateral basilar rhonchi, no wheezing, no crackles. Normal respiratory effort. No accessory muscle use.  Cardiovascular: Regular rate and rhythm, no murmurs / rubs / gallops. No extremity edema. 2+ pedal pulses. No carotid bruits.  Abdomen: no tenderness, no masses palpated. No hepatosplenomegaly. Bowel sounds positive.  Musculoskeletal: no clubbing / cyanosis. No joint deformity upper and lower extremities. Good ROM, no contractures. Normal muscle tone.  Skin: Hot and dry, no rashes, lesions, ulcers. No induration Neurologic: CN 2-12 grossly intact. Sensation intact, DTR normal. Strength 5/5 in all 4.  Psychiatric: Normal judgment and insight. Alert and oriented x 3. Normal mood.    Labs on Admission: I have personally reviewed following labs and imaging studies  CBC: Recent Labs  Lab 05/24/18 1425 05/28/18 0839  WBC 2.7* 4.8  NEUTROABS 0.5* 1.4*  HGB 7.3* 7.7*  HCT 24.7* 27.0*  MCV 86.4 85.2  PLT 126* 381*   Basic Metabolic Panel: Recent Labs  Lab 05/28/18 0839  NA 141  K 3.9  CL 106  CO2 24  GLUCOSE 169*  BUN 38*  CREATININE 2.11*  CALCIUM 8.7*   GFR: Estimated Creatinine Clearance: 25.6 mL/min (A) (by C-G formula based on SCr of 2.11 mg/dL (H)). Liver Function Tests: Recent Labs  Lab 05/28/18 0839  AST 15  ALT 16  ALKPHOS 71  BILITOT 0.7  PROT 7.9  ALBUMIN 2.9*   Urine analysis:    Component Value  Date/Time   COLORURINE YELLOW 04/16/2018 [place] 04/16/2018 1219   LABSPEC 1.011 04/16/2018 1219  PHURINE 5.0 04/16/2018 1219   GLUCOSEU NEGATIVE 04/16/2018 1219   HGBUR SMALL (A) 04/16/2018 1219   BILIRUBINUR NEGATIVE 04/16/2018 1219   KETONESUR NEGATIVE 04/16/2018 1219   PROTEINUR NEGATIVE 04/16/2018 1219   NITRITE NEGATIVE 04/16/2018 1219   LEUKOCYTESUR NEGATIVE 04/16/2018 1219    Radiological Exams on Admission: Dg Chest Port 1 View  Result Date: 05/28/2018 CLINICAL DATA:  Chest pain. Prostate cancer. Lymphoma. EXAM: PORTABLE CHEST 1 VIEW COMPARISON:  05/01/2018 FINDINGS: Artifact overlies the chest. Mild cardiomegaly and aortic atherosclerosis. Newly seen abnormal density in the right upper lobe and in both lower lobes most consistent with pneumonia. Some pleural density noted on the left as well. IMPRESSION: New pulmonary density in the right apex and at both lung bases most consistent with pneumonia. Small associated left effusion. Electronically Signed   By: Nelson Chimes M.D.   On: 05/28/2018 09:07    EKG: Independently reviewed.  Sinus tachycardia with PVCs and nonspecific interventricular conduction delay with left axis deviation and nonspecific ST-T wave changes.  When compared with May 01, 2018 interventricular conduction delay with left axis deviation and ST-T wave abnormalities are new.  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  Healthcare associated pneumonia: Located in right upper quadrant and bilateral bases hest x-ray.  Patient will be admitted into the hospital started on cefepime and vancomycin.   He is not terribly hypoxemic and able to tolerate treatment well.  He is awake and alert and responsive.  Urgently his family brought him in fairly early and I believe that we will be able to get on top of this rather quickly.  I did notify family that he could potentially get worse before he gets better.  To be mated to the hospital with pneumonia protocol.  2.  Myelodysplastic syndrome with 5 q. deletion: Patient followed by Dr. Simeon Craft such.  He has had hemoglobins about this range and plans have not been made to transfuse him due to difficulties with that.  This point we will continue to monitor and will discuss with oncology prior to transfusion.  3.  Sepsis: This is mild he does not have severe sepsis.  His blood pressure is slightly low in the emergency department but it was higher earlier he is awake and responsive and I believe that this drop in blood pressure is regarding his fever and IV fluids have been ordered.  Need to be very gentle with his IV fluids that he is he is very fragile and has an ejection fraction of only 30% when he is well.  4.  Coronary artery disease: Deemed a noninterventional candidate this fall.  Continue medical management.  5.  Chronic renal failure stage III moderate: Monitor closely renal failure appears to be at the high end of his baseline we will gently hydrate.  6.  Type 2 diabetes mellitus: Check fingerstick blood glucoses monitor before meals and at bedtime.  7.  Congestive heart failure: Currently compensated we will continue present management.  8.  Essential hypertension blood pressures okay continue present care.  9.  Goals of care: Patient with multiple admissions to the hospital and even more visits to the emergency department.  I will consult palliative care for assistance.  His daughter did reaffirm his DO NOT RESUSCITATE status.  I think the family wants to take a general comfort approach not sure if they are ready for hospice.  DVT prophylaxis:  SCDs Code Status: Not resuscitate Family Communication: Talk with patient's daughter and patient's wife were present in the room at the time of admission. Disposition Plan: Possibly home or to skilled facility depending on patient's progress.  Likely in 3 to 4 days. Consults called: Palliative care Admission status: Inpatient   Lady Deutscher MD FACP [facility] Pager [phone]  If 7PM-7AM, please contact night-coverage [email or web address] Password [email or web address]  05/28/2018, 10:50 AM

## 2018-05-28 NOTE — ED Notes (Signed)
Ultram given as requested d/t pt c/o chronic abd pain and nonprod cough. Family remain at bedside.

## 2018-05-29 ENCOUNTER — Other Ambulatory Visit (HOSPITAL_COMMUNITY): Payer: Medicare Other

## 2018-05-29 ENCOUNTER — Inpatient Hospital Stay (HOSPITAL_COMMUNITY): Payer: Medicare Other

## 2018-05-29 DIAGNOSIS — D649 Anemia, unspecified: Secondary | ICD-10-CM

## 2018-05-29 DIAGNOSIS — Z9181 History of falling: Secondary | ICD-10-CM

## 2018-05-29 DIAGNOSIS — I340 Nonrheumatic mitral (valve) insufficiency: Secondary | ICD-10-CM

## 2018-05-29 DIAGNOSIS — I2510 Atherosclerotic heart disease of native coronary artery without angina pectoris: Secondary | ICD-10-CM

## 2018-05-29 DIAGNOSIS — S61210D Laceration without foreign body of right index finger without damage to nail, subsequent encounter: Secondary | ICD-10-CM

## 2018-05-29 DIAGNOSIS — E1151 Type 2 diabetes mellitus with diabetic peripheral angiopathy without gangrene: Secondary | ICD-10-CM

## 2018-05-29 DIAGNOSIS — I499 Cardiac arrhythmia, unspecified: Secondary | ICD-10-CM

## 2018-05-29 DIAGNOSIS — I361 Nonrheumatic tricuspid (valve) insufficiency: Secondary | ICD-10-CM

## 2018-05-29 DIAGNOSIS — W19XXXD Unspecified fall, subsequent encounter: Secondary | ICD-10-CM

## 2018-05-29 DIAGNOSIS — R011 Cardiac murmur, unspecified: Secondary | ICD-10-CM

## 2018-05-29 DIAGNOSIS — Z960 Presence of urogenital implants: Secondary | ICD-10-CM

## 2018-05-29 DIAGNOSIS — D693 Immune thrombocytopenic purpura: Secondary | ICD-10-CM

## 2018-05-29 DIAGNOSIS — R7881 Bacteremia: Secondary | ICD-10-CM

## 2018-05-29 DIAGNOSIS — Z87891 Personal history of nicotine dependence: Secondary | ICD-10-CM

## 2018-05-29 DIAGNOSIS — B952 Enterococcus as the cause of diseases classified elsewhere: Secondary | ICD-10-CM

## 2018-05-29 DIAGNOSIS — D46C Myelodysplastic syndrome with isolated del(5q) chromosomal abnormality: Secondary | ICD-10-CM

## 2018-05-29 LAB — CBC WITH DIFFERENTIAL/PLATELET
ABS IMMATURE GRANULOCYTES: 0.03 10*3/uL (ref 0.00–0.07)
Basophils Absolute: 0 10*3/uL (ref 0.0–0.1)
Basophils Relative: 0 %
Eosinophils Absolute: 0 10*3/uL (ref 0.0–0.5)
Eosinophils Relative: 0 %
HCT: 22 % — ABNORMAL LOW (ref 39.0–52.0)
Hemoglobin: 6.5 g/dL — CL (ref 13.0–17.0)
IMMATURE GRANULOCYTES: 1 %
LYMPHS PCT: 52 %
Lymphs Abs: 2 10*3/uL (ref 0.7–4.0)
MCH: 24.9 pg — ABNORMAL LOW (ref 26.0–34.0)
MCHC: 29.5 g/dL — ABNORMAL LOW (ref 30.0–36.0)
MCV: 84.3 fL (ref 80.0–100.0)
Monocytes Absolute: 0.7 10*3/uL (ref 0.1–1.0)
Monocytes Relative: 17 %
NEUTROS ABS: 1.2 10*3/uL — AB (ref 1.7–7.7)
NEUTROS PCT: 30 %
Platelets: UNDETERMINED 10*3/uL (ref 150–400)
RBC: 2.61 MIL/uL — ABNORMAL LOW (ref 4.22–5.81)
RDW: 23.4 % — ABNORMAL HIGH (ref 11.5–15.5)
WBC: 3.8 10*3/uL — ABNORMAL LOW (ref 4.0–10.5)
nRBC: 0 % (ref 0.0–0.2)

## 2018-05-29 LAB — BLOOD CULTURE ID PANEL (REFLEXED)
ACINETOBACTER BAUMANNII: NOT DETECTED
Candida albicans: NOT DETECTED
Candida glabrata: NOT DETECTED
Candida krusei: NOT DETECTED
Candida parapsilosis: NOT DETECTED
Candida tropicalis: NOT DETECTED
Enterobacter cloacae complex: NOT DETECTED
Enterobacteriaceae species: NOT DETECTED
Enterococcus species: DETECTED — AB
Escherichia coli: NOT DETECTED
Haemophilus influenzae: NOT DETECTED
Klebsiella oxytoca: NOT DETECTED
Klebsiella pneumoniae: NOT DETECTED
Listeria monocytogenes: NOT DETECTED
NEISSERIA MENINGITIDIS: NOT DETECTED
Proteus species: NOT DETECTED
Pseudomonas aeruginosa: NOT DETECTED
STAPHYLOCOCCUS SPECIES: NOT DETECTED
Serratia marcescens: NOT DETECTED
Staphylococcus aureus (BCID): NOT DETECTED
Streptococcus agalactiae: NOT DETECTED
Streptococcus pneumoniae: NOT DETECTED
Streptococcus pyogenes: NOT DETECTED
Streptococcus species: NOT DETECTED
Vancomycin resistance: NOT DETECTED

## 2018-05-29 LAB — BASIC METABOLIC PANEL
Anion gap: 10 (ref 5–15)
BUN: 39 mg/dL — ABNORMAL HIGH (ref 8–23)
CO2: 24 mmol/L (ref 22–32)
Calcium: 8.2 mg/dL — ABNORMAL LOW (ref 8.9–10.3)
Chloride: 108 mmol/L (ref 98–111)
Creatinine, Ser: 2.4 mg/dL — ABNORMAL HIGH (ref 0.61–1.24)
GFR calc Af Amer: 27 mL/min — ABNORMAL LOW (ref >60)
GFR calc non Af Amer: 24 mL/min — ABNORMAL LOW (ref >60)
Glucose, Bld: 179 mg/dL — ABNORMAL HIGH (ref 70–99)
Potassium: 3.9 mmol/L (ref 3.5–5.1)
Sodium: 142 mmol/L (ref 135–145)

## 2018-05-29 LAB — PATHOLOGIST SMEAR REVIEW

## 2018-05-29 LAB — GLUCOSE, CAPILLARY
Glucose-Capillary: 249 mg/dL — ABNORMAL HIGH (ref 70–99)
Glucose-Capillary: 166 mg/dL — ABNORMAL HIGH (ref 70–99)
Glucose-Capillary: 179 mg/dL — ABNORMAL HIGH (ref 70–99)

## 2018-05-29 LAB — ECHOCARDIOGRAM COMPLETE
Height: 69 in
Weight: 2941.82 oz

## 2018-05-29 LAB — CBC
HCT: 22.4 % — ABNORMAL LOW (ref 39.0–52.0)
Hemoglobin: 6.8 g/dL — CL (ref 13.0–17.0)
MCH: 25.5 pg — ABNORMAL LOW (ref 26.0–34.0)
MCHC: 30.4 g/dL (ref 30.0–36.0)
MCV: 83.9 fL (ref 80.0–100.0)
NRBC: 0.4 % — AB (ref 0.0–0.2)
Platelets: 126 10*3/uL — ABNORMAL LOW (ref 150–400)
RBC: 2.67 MIL/uL — ABNORMAL LOW (ref 4.22–5.81)
RDW: 22.2 % — ABNORMAL HIGH (ref 11.5–15.5)
WBC: 4.8 10*3/uL (ref 4.0–10.5)

## 2018-05-29 LAB — PREPARE RBC (CROSSMATCH)

## 2018-05-29 LAB — URINE CULTURE: Culture: <10000 — AB

## 2018-05-29 LAB — HIV ANTIBODY (ROUTINE TESTING W REFLEX): HIV Screen 4th Generation wRfx: NONREACTIVE

## 2018-05-29 LAB — VANCOMYCIN, TROUGH: Vancomycin Tr: 10 ug/mL — ABNORMAL LOW (ref 15–20)

## 2018-05-29 MED ORDER — SODIUM CHLORIDE 0.9% IV SOLUTION: Freq: Once | INTRAVENOUS | Status: DC

## 2018-05-29 MED ORDER — FUROSEMIDE 20 MG PO TABS
20.0000 mg | ORAL_TABLET | Freq: Once | ORAL | Status: AC
  Administered 2018-05-29: 20 mg via ORAL
  Filled 2018-05-29: qty 1

## 2018-05-29 MED ORDER — SODIUM CHLORIDE 0.9% IV SOLUTION
Freq: Once | INTRAVENOUS | Status: AC
  Administered 2018-05-29: 14:00:00 via INTRAVENOUS

## 2018-05-29 MED ORDER — INSULIN ASPART 100 UNIT/ML ~~LOC~~ SOLN
0.0000 [IU] | Freq: Three times a day (TID) | SUBCUTANEOUS | Status: DC
  Administered 2018-05-29: 3 [IU] via SUBCUTANEOUS
  Administered 2018-05-29 – 2018-05-30 (×2): 2 [IU] via SUBCUTANEOUS
  Administered 2018-05-30: 3 [IU] via SUBCUTANEOUS
  Administered 2018-05-30 – 2018-05-31 (×2): 1 [IU] via SUBCUTANEOUS
  Administered 2018-05-31 (×2): 2 [IU] via SUBCUTANEOUS

## 2018-05-29 MED ORDER — SENNOSIDES-DOCUSATE SODIUM 8.6-50 MG PO TABS
1.0000 | ORAL_TABLET | Freq: Two times a day (BID) | ORAL | Status: DC
  Administered 2018-05-29 – 2018-06-12 (×27): 1 via ORAL
  Filled 2018-05-29 (×25): qty 1

## 2018-05-29 MED ORDER — SODIUM CHLORIDE 0.9 % IV SOLN
3.0000 g | Freq: Two times a day (BID) | INTRAVENOUS | Status: DC
  Administered 2018-05-29 – 2018-05-30 (×2): 3 g via INTRAVENOUS
  Filled 2018-05-29 (×3): qty 3

## 2018-05-29 MED ORDER — INSULIN ASPART 100 UNIT/ML ~~LOC~~ SOLN
0.0000 [IU] | Freq: Every day | SUBCUTANEOUS | Status: DC
  Administered 2018-05-30: 3 [IU] via SUBCUTANEOUS
  Administered 2018-06-06 – 2018-06-11 (×3): 2 [IU] via SUBCUTANEOUS

## 2018-05-29 MED ORDER — ACETAMINOPHEN 325 MG PO TABS
650.0000 mg | ORAL_TABLET | Freq: Four times a day (QID) | ORAL | Status: DC | PRN
  Administered 2018-05-29 – 2018-06-11 (×25): 650 mg via ORAL
  Filled 2018-05-29 (×24): qty 2

## 2018-05-29 MED ORDER — POLYETHYLENE GLYCOL 3350 17 G PO PACK
17.0000 g | PACK | Freq: Every day | ORAL | Status: DC
  Administered 2018-05-29 – 2018-06-09 (×9): 17 g via ORAL
  Filled 2018-05-29 (×11): qty 1

## 2018-05-29 MED ORDER — FUROSEMIDE 20 MG PO TABS
20.0000 mg | ORAL_TABLET | Freq: Every day | ORAL | Status: DC
  Administered 2018-05-29: 20 mg via ORAL
  Filled 2018-05-29: qty 1

## 2018-05-29 NOTE — Consult Note (Signed)
[facility]    Date of Admission:  05/28/2018   Total days of antibiotics: 1               Reason for Consult: Enterococcal bacteremia   Referring Provider: CHAMP   Assessment: Enterococcal bacteremia DM Recent fall,  Wound on R 2nd digit Prev murmur   Plan: 1. Narrow his anbx to unasyn 2. Stop vanco (his Cr is worsening) and cefepime.  3. Repeat his BCx 4. Repeat his TTE  5. Consider palliative care eval with his multiple ED/hospital visits.  6. His finger wound appears to be healing well. No d/c from os.   Comment- I am equivocal in checking a tee on this pt. He has multiple illnesss and I am not currently sure he could handle a TEE. However, he does have a hx of a murmur (but no cardiac devices) which increases his risk.   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 aspirin EC  81 mg Oral Daily  . clopidogrel  75 mg Oral Daily  . furosemide  20 mg Oral Daily  . gabapentin  100 mg Oral QHS  . insulin glargine  20 Units Subcutaneous q morning - 10a  . isosorbide mononitrate  60 mg Oral Daily  . metoprolol succinate  100 mg Oral Daily  . multivitamin with minerals  1 tablet Oral q morning - 10a  . pravastatin  40 mg Oral q1800  . tamsulosin  0.4 mg Oral Daily    HPI: Ricardo Watkins is a 84 y.o. male with hx of PVD (renal artery stent), DM, MDS, ITP, CAD (not an intervention candidate,prev EF 30%) adm on 12-8 with weakness, chest pain and temp 102.  He was dx as HCAP due to infiltrates seen on his CXR (he has had multiple ED visits this year). He was  started on vanco/cefepime.  He is now noted to have enterococcal bacteremia (1/2). He was also noted to have worsening anemia and Cr since adm.  His UCx is <10k and his UA is unremarkable.  He has defervesced.   He had a fall ~ 1 week ago and has a small wound on his R index finger. He has been on doxy for this. His family feels that this is improving.  He had diarrhea on 12-4 and took antiparistaltic and has had no further BM since.  States he is able to do all his own ADLs. Sneaked out and got a haircut on saturday.  He has a prev murmur on echo- AS (1.96), TR, MR   Review of Systems: Review of Systems  Constitutional: Positive for fever.  Eyes: Negative for blurred vision.  Respiratory: Negative for cough and shortness of breath.   Cardiovascular: Positive for chest pain and leg swelling.  Gastrointestinal: Negative for constipation and diarrhea.  Genitourinary: Negative for dysuria.  Musculoskeletal: Positive for falls.  Please see HPI. All other systems reviewed and negative.   Past Medical History:  Diagnosis Date  . Adenomatous colon polyp   . CAD (coronary artery disease)  30% LAD Stenosis, 70% ramus intermedius stenosis, treated with PTCA and angioplasty by Dr Albertine Patricia 2004  . Cataract    right eye  . CHF (congestive heart failure) ([facility])   . Chronic ITP (idiopathic thrombocytopenia) (HCC)   . Cough, persistent 11/04/2015  . Diverticulosis   . DM (diabetes mellitus) ([facility])   . DVT (deep venous thrombosis) ([facility])    secondary to surgery  . Dyspnea   . Fever 05/01/2018  . Hyperlipidemia   . Hypertension   . Inguinal hernia    right  . Laceration of finger of right hand 05/01/2018   INDEX FINGER  . 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 Microcytic anemia 07/07/2015  . 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 Non Hodgkin's lymphoma ([facility])   . Pleural effusion, left 11/04/2015  . 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 PVD (peripheral vascular disease) ([facility])    rt renal artery stent  . Renal insufficiency   . Thrombocytopenia ([facility])   . 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 Thyroid nodule    left lower lobe (annual monitoring).  . Vitamin D deficiency     Social History   Tobacco Use  . Smoking status: Former Smoker    Packs/day: 0.50    Years: 20.00    Pack years: 10.00    Types: Pipe, Cigarettes    Last attempt to quit: 06/21/1958    Years since quitting: 59.9  . Smokeless tobacco: Never Used  Substance Use Topics  . Alcohol use: No    Alcohol/week: 0.0 standard drinks  . Drug use: No    Family History  Problem Relation Age of Onset  . Lymphoma Sister   . Prostate cancer Brother   . Breast cancer Sister   . Diabetes Brother   . Diabetes Sister   . Heart disease Brother   . Asthma Son      Medications:  Scheduled: . aspirin EC  81 mg Oral Daily  . clopidogrel  75 mg Oral Daily  . gabapentin  100 mg Oral QHS  . insulin aspart  0-5 Units Subcutaneous QHS  . insulin aspart  0-9 Units Subcutaneous TID WC  . insulin glargine  20 Units Subcutaneous q morning - 10a  . isosorbide mononitrate  60 mg Oral Daily  . metoprolol succinate  100 mg Oral Daily  . multivitamin with minerals  1 tablet Oral q morning - 10a  . polyethylene glycol  17 g Oral Daily  . pravastatin  40 mg Oral q1800  . tamsulosin  0.4 mg Oral Daily    Abtx:  Anti-infectives (From admission, onward)   Start     Dose/Rate Route Frequency Ordered Stop   05/29/18 1000  vancomycin (VANCOCIN) IVPB 750 mg/150 ml premix     750 mg 150 mL/hr over 60 Minutes Intravenous Every 24 hours 05/28/18 1004     05/28/18 2200  ceFEPIme (MAXIPIME) 1 g in sodium chloride 0.9 % 100 mL IVPB     1 g 200 mL/hr over 30 Minutes Intravenous Every 12 hours 05/28/18  1004     05/28/18 0845  ceFEPIme (MAXIPIME) 2 g in sodium chloride 0.9 % 100 mL IVPB     2 g 200 mL/hr over  30 Minutes Intravenous  Once 05/28/18 0834 05/28/18 0931   05/28/18 0845  vancomycin (VANCOCIN) IVPB 1000 mg/200 mL premix  Status:  Discontinued     1,000 mg 200 mL/hr over 60 Minutes Intravenous  Once 05/28/18 0834 05/28/18 0839   05/28/18 0845  vancomycin (VANCOCIN) 1,500 mg in sodium chloride 0.9 % 500 mL IVPB     1,500 mg 250 mL/hr over 120 Minutes Intravenous  Once 05/28/18 0839 05/28/18 1156       OBJECTIVE: Blood pressure (!) 140/46, pulse 95, temperature 99.4 F (37.4 C), temperature source Oral, resp. rate 20, height 5\' 9"  (1.753 m), weight 83.4 kg, SpO2 96 %.  Physical Exam  Constitutional: He is oriented to person, place, and time. No distress.  HENT:  Mouth/Throat: Mucous membranes are dry. No oropharyngeal exudate.  Eyes: Pupils are equal, round, and reactive to light. EOM are normal.  Neck: Normal range of motion. Neck supple.  Cardiovascular: An irregularly irregular rhythm present.  Murmur heard.  Crescendo systolic murmur is present with a grade of 4/6. Pulmonary/Chest: Effort normal and breath sounds normal.  Abdominal: Soft. Bowel sounds are normal. He exhibits no distension. There is no tenderness.  Musculoskeletal: He exhibits edema.  Lymphadenopathy:    He has no cervical adenopathy.  Neurological: He is alert and oriented to person, place, and time.  Skin: He is not diaphoretic.    Lab Results Results for orders placed or performed during the hospital encounter of 05/28/18 (from the past 48 hour(s))  Blood Culture (routine x 2)     Status: None (Preliminary result)   Collection Time: 05/28/18  8:34 AM  Result Value Ref Range   Specimen Description BLOOD BLOOD RIGHT FOREARM    Special Requests      BOTTLES DRAWN AEROBIC AND ANAEROBIC Blood Culture adequate volume   Culture      NO GROWTH < 12 HOURS Performed at [facility] [address], [place], [place] [address]    Report Status PENDING   Urine culture     Status: Abnormal   Collection Time: 05/28/18  8:34 AM  Result Value Ref Range   Specimen Description URINE, RANDOM    Special Requests NONE    Culture (A)     <10,000 COLONIES/mL INSIGNIFICANT GROWTH Performed at [facility] [address] [place], [place] [address]    Report Status 05/29/2018 FINAL   POC occult blood, ED Provider will collect     Status: None   Collection Time: 05/28/18  8:35 AM  Result Value Ref Range   Fecal Occult Bld NEGATIVE NEGATIVE  Comprehensive metabolic panel     Status: Abnormal   Collection Time: 05/28/18  8:39 AM  Result Value Ref Range   Sodium 141 135 - 145 mmol/L   Potassium 3.9 3.5 - 5.1 mmol/L   Chloride 106 98 - 111 mmol/L   CO2 24 22 - 32 mmol/L   Glucose, Bld 169 (H) 70 - 99 mg/dL   BUN 38 (H) 8 - 23 mg/dL   Creatinine, Ser 2.11 (H) 0.61 - 1.24 mg/dL   Calcium 8.7 (L) 8.9 - 10.3 mg/dL   Total Protein 7.9 6.5 - 8.1 g/dL   Albumin 2.9 (L) 3.5 - 5.0 g/dL   AST 15 15 - 41 U/L   ALT 16 0 - 44 U/L   Alkaline Phosphatase 71 38 - 126 U/L   Total Bilirubin 0.7 0.3 - 1.2 mg/dL   GFR calc non Af Wyvonnia Lora  28 (L) >60 mL/min   GFR calc Af Amer 32 (L) >60 mL/min   Anion gap 11 5 - 15    Comment: Performed at [facility] [address], [place], [place] [address]  CBC WITH DIFFERENTIAL     Status: Abnormal   Collection Time: 05/28/18  8:39 AM  Result Value Ref Range   WBC 4.8 4.0 - 10.5 K/uL    Comment: WHITE COUNT CONFIRMED ON SMEAR   RBC 3.17 (L) 4.22 - 5.81 MIL/uL   Hemoglobin 7.7 (L) 13.0 - 17.0 g/dL   HCT 27.0 (L) 39.0 - 52.0 %   MCV 85.2 80.0 - 100.0 fL   MCH 24.3 (L) 26.0 - 34.0 pg   MCHC 28.5 (L) 30.0 - 36.0 g/dL   RDW 23.1 (H) 11.5 - 15.5 %   Platelets 144 (L) 150 - 400 K/uL    Comment: REPEATED TO VERIFY PLATELET COUNT CONFIRMED BY SMEAR SPECIMEN CHECKED FOR CLOTS Immature Platelet Fraction may be clinically indicated, consider ordering  this additional test [id number]    nRBC 0.0 0.0 - 0.2 %   Neutrophils Relative % 29 %   Neutro Abs 1.4 (L) 1.7 - 7.7 K/uL   Lymphocytes Relative 55 %   Lymphs Abs 2.6 0.7 - 4.0 K/uL   Monocytes Relative 15 %   Monocytes Absolute 0.7 0.1 - 1.0 K/uL   Eosinophils Relative 0 %   Eosinophils Absolute 0.0 0.0 - 0.5 K/uL   Basophils Relative 0 %   Basophils Absolute 0.0 0.0 - 0.1 K/uL   WBC Morphology Abnormal lymphocytes present    Immature Granulocytes 1 %   Abs Immature Granulocytes 0.06 0.00 - 0.07 K/uL   Acanthocytes PRESENT    Ovalocytes PRESENT     Comment: Performed at [facility], [address], [place], [place] [address]  Blood Culture (routine x 2)     Status: None (Preliminary result)   Collection Time: 05/28/18  8:39 AM  Result Value Ref Range   Specimen Description BLOOD RIGHT ANTECUBITAL    Special Requests      BOTTLES DRAWN AEROBIC AND ANAEROBIC Blood Culture adequate volume   Culture  Setup Time      GRAM POSITIVE COCCI AEROBIC BOTTLE ONLY Organism ID to follow CRITICAL RESULT CALLED TO, READ BACK BY AND VERIFIED WITH: Salli Real [id number] MLM Performed at [facility], [place] [address] [place], [place] [address]    Culture GRAM POSITIVE COCCI    Report Status PENDING   Blood Culture ID Panel (Reflexed)     Status: Abnormal   Collection Time: 05/28/18  8:39 AM  Result Value Ref Range   Enterococcus species DETECTED (A) NOT DETECTED    Comment: CRITICAL RESULT CALLED TO, READ BACK BY AND VERIFIED WITH: PHARMD G ABBOTT [id number] MLM    Vancomycin resistance NOT DETECTED NOT DETECTED   Listeria monocytogenes NOT DETECTED NOT DETECTED   Staphylococcus species NOT DETECTED NOT DETECTED   Staphylococcus aureus (BCID) NOT DETECTED NOT DETECTED   Streptococcus species NOT DETECTED NOT DETECTED   Streptococcus agalactiae NOT DETECTED NOT DETECTED   Streptococcus pneumoniae NOT DETECTED NOT DETECTED   Streptococcus pyogenes NOT DETECTED NOT  DETECTED   Acinetobacter baumannii NOT DETECTED NOT DETECTED   Enterobacteriaceae species NOT DETECTED NOT DETECTED   Enterobacter cloacae complex NOT DETECTED NOT DETECTED   Escherichia coli NOT DETECTED NOT DETECTED   Klebsiella oxytoca NOT DETECTED NOT DETECTED   Klebsiella pneumoniae NOT  DETECTED NOT DETECTED   Proteus species NOT DETECTED NOT DETECTED   Serratia marcescens NOT DETECTED NOT DETECTED   Haemophilus influenzae NOT DETECTED NOT DETECTED   Neisseria meningitidis NOT DETECTED NOT DETECTED   Pseudomonas aeruginosa NOT DETECTED NOT DETECTED   Candida albicans NOT DETECTED NOT DETECTED   Candida glabrata NOT DETECTED NOT DETECTED   Candida krusei NOT DETECTED NOT DETECTED   Candida parapsilosis NOT DETECTED NOT DETECTED   Candida tropicalis NOT DETECTED NOT DETECTED    Comment: Performed at [facility], [place] [address] [place], [place] [address]  I-Stat CG4 Lactic Acid, ED     Status: None   Collection Time: 05/28/18  8:49 AM  Result Value Ref Range   Lactic Acid, Venous 1.43 0.5 - 1.9 mmol/L  Type and screen     Status: None   Collection Time: 05/28/18 10:00 AM  Result Value Ref Range   ABO/RH(D) O POS    Antibody Screen NEG    Sample Expiration      05/31/2018 Performed at [facility], [place] [address] [place], [place] [address]   Strep pneumoniae urinary antigen     Status: None   Collection Time: 05/28/18 10:14 AM  Result Value Ref Range   Strep Pneumo Urinary Antigen NEGATIVE NEGATIVE    Comment:        Infection due to S. pneumoniae cannot be absolutely ruled out since the antigen present may be below the detection limit of the test. Performed at [facility], [address], [place], [place] [address]   Urinalysis, Routine w reflex microscopic     Status: Abnormal   Collection Time: 05/28/18 10:33 AM  Result Value Ref Range   Color, Urine YELLOW YELLOW   APPearance CLEAR CLEAR   Specific Gravity, Urine 1.015 1.005 - 1.030   pH 5.0  5.0 - 8.0   Glucose, UA NEGATIVE NEGATIVE mg/dL   Hgb urine dipstick NEGATIVE NEGATIVE   Bilirubin Urine NEGATIVE NEGATIVE   Ketones, ur NEGATIVE NEGATIVE mg/dL   Protein, ur 30 (A) NEGATIVE mg/dL   Nitrite NEGATIVE NEGATIVE   Leukocytes, UA NEGATIVE NEGATIVE   RBC / HPF 0-5 0 - 5 RBC/hpf   WBC, UA 0-5 0 - 5 WBC/hpf   Bacteria, UA RARE (A) NONE SEEN   Squamous Epithelial / LPF 0-5 0 - 5   Mucus PRESENT     Comment: Performed at [facility] [address] [place], [place] [address]  I-Stat CG4 Lactic Acid, ED     Status: None   Collection Time: 05/28/18 10:38 AM  Result Value Ref Range   Lactic Acid, Venous 0.52 0.5 - 1.9 mmol/L  Respiratory Panel by PCR     Status: None   Collection Time: 05/28/18 12:38 PM  Result Value Ref Range   Adenovirus NOT DETECTED NOT DETECTED   Coronavirus 229E NOT DETECTED NOT DETECTED   Coronavirus HKU1 NOT DETECTED NOT DETECTED   Coronavirus NL63 NOT DETECTED NOT DETECTED   Coronavirus OC43 NOT DETECTED NOT DETECTED   Metapneumovirus NOT DETECTED NOT DETECTED   Rhinovirus / Enterovirus NOT DETECTED NOT DETECTED   Influenza A NOT DETECTED NOT DETECTED   Influenza B NOT DETECTED NOT DETECTED   Parainfluenza Virus 1 NOT DETECTED NOT DETECTED   Parainfluenza Virus 2 NOT DETECTED NOT DETECTED   Parainfluenza Virus 3 NOT DETECTED NOT DETECTED   Parainfluenza Virus 4 NOT DETECTED NOT DETECTED   Respiratory Syncytial Virus NOT DETECTED  NOT DETECTED   Bordetella pertussis NOT DETECTED NOT DETECTED   Chlamydophila pneumoniae NOT DETECTED NOT DETECTED   Mycoplasma pneumoniae NOT DETECTED NOT DETECTED  CBG monitoring, ED     Status: Abnormal   Collection Time: 05/28/18  2:20 PM  Result Value Ref Range   Glucose-Capillary 159 (H) 70 - 99 mg/dL  Basic metabolic panel     Status: Abnormal   Collection Time: 05/29/18  5:39 AM  Result Value Ref Range   Sodium 142 135 - 145 mmol/L   Potassium 3.9 3.5 - 5.1 mmol/L   Chloride 108 98 - 111 mmol/L   CO2  24 22 - 32 mmol/L   Glucose, Bld 179 (H) 70 - 99 mg/dL   BUN 39 (H) 8 - 23 mg/dL   Creatinine, Ser 2.40 (H) 0.61 - 1.24 mg/dL   Calcium 8.2 (L) 8.9 - 10.3 mg/dL   GFR calc non Af Amer 24 (L) >60 mL/min   GFR calc Af Amer 27 (L) >60 mL/min   Anion gap 10 5 - 15    Comment: Performed at [facility] [address] [place], [place] [address]  CBC WITH DIFFERENTIAL     Status: Abnormal   Collection Time: 05/29/18  5:39 AM  Result Value Ref Range   WBC 3.8 (L) 4.0 - 10.5 K/uL   RBC 2.61 (L) 4.22 - 5.81 MIL/uL   Hemoglobin 6.5 (LL) 13.0 - 17.0 g/dL    Comment: This critical result has verified and been called to C HUTCHENS RN by Tami Ribas on 12 09 2019 at 0835, and has been read back.    HCT 22.0 (L) 39.0 - 52.0 %   MCV 84.3 80.0 - 100.0 fL   MCH 24.9 (L) 26.0 - 34.0 pg   MCHC 29.5 (L) 30.0 - 36.0 g/dL   RDW 23.4 (H) 11.5 - 15.5 %   Platelets PLATELET CLUMPS NOTED ON SMEAR, UNABLE TO ESTIMATE 150 - 400 K/uL   nRBC 0.0 0.0 - 0.2 %   Neutrophils Relative % 30 %   Neutro Abs 1.2 (L) 1.7 - 7.7 K/uL   Lymphocytes Relative 52 %   Lymphs Abs 2.0 0.7 - 4.0 K/uL   Monocytes Relative 17 %   Monocytes Absolute 0.7 0.1 - 1.0 K/uL   Eosinophils Relative 0 %   Eosinophils Absolute 0.0 0.0 - 0.5 K/uL   Basophils Relative 0 %   Basophils Absolute 0.0 0.0 - 0.1 K/uL   Immature Granulocytes 1 %   Abs Immature Granulocytes 0.03 0.00 - 0.07 K/uL   Acanthocytes PRESENT    Ovalocytes PRESENT     Comment: Performed at [facility], [address], [place], [place] [address]      Component Value Date/Time   SDES BLOOD RIGHT ANTECUBITAL 05/28/2018 0839   SPECREQUEST  05/28/2018 0839    BOTTLES DRAWN AEROBIC AND ANAEROBIC Blood Culture adequate volume   CULT GRAM POSITIVE COCCI 05/28/2018 0839   REPTSTATUS PENDING 05/28/2018 0839   Dg Chest Port 1 View  Result Date: 05/28/2018 CLINICAL DATA:  Chest pain. Prostate cancer. Lymphoma. EXAM: PORTABLE CHEST 1 VIEW COMPARISON:   05/01/2018 FINDINGS: Artifact overlies the chest. Mild cardiomegaly and aortic atherosclerosis. Newly seen abnormal density in the right upper lobe and in both lower lobes most consistent with pneumonia. Some pleural density noted on the left as well. IMPRESSION: New pulmonary density in the right apex and at both lung bases most consistent with pneumonia. Small associated left effusion.  Electronically Signed   By: Nelson Chimes M.D.   On: 05/28/2018 09:07   Recent Results (from the past 240 hour(s))  Blood Culture (routine x 2)     Status: None (Preliminary result)   Collection Time: 05/28/18  8:34 AM  Result Value Ref Range Status   Specimen Description BLOOD BLOOD RIGHT FOREARM  Final   Special Requests   Final    BOTTLES DRAWN AEROBIC AND ANAEROBIC Blood Culture adequate volume   Culture   Final    NO GROWTH < 12 HOURS Performed at [facility], [address], [place], [place] [address]    Report Status PENDING  Incomplete  Urine culture     Status: Abnormal   Collection Time: 05/28/18  8:34 AM  Result Value Ref Range Status   Specimen Description URINE, RANDOM  Final   Special Requests NONE  Final   Culture (A)  Final    <10,000 COLONIES/mL INSIGNIFICANT GROWTH Performed at [facility] [address] [place], [place] [address]    Report Status 05/29/2018 FINAL  Final  Blood Culture (routine x 2)     Status: None (Preliminary result)   Collection Time: 05/28/18  8:39 AM  Result Value Ref Range Status   Specimen Description BLOOD RIGHT ANTECUBITAL  Final   Special Requests   Final    BOTTLES DRAWN AEROBIC AND ANAEROBIC Blood Culture adequate volume   Culture  Setup Time   Final    GRAM POSITIVE COCCI AEROBIC BOTTLE ONLY Organism ID to follow CRITICAL RESULT CALLED TO, READ BACK BY AND VERIFIED WITH: Salli Real [id number] MLM Performed at [facility], [place] [address], [place], [place] [address]    Culture GRAM POSITIVE COCCI  Final   Report Status  PENDING  Incomplete  Blood Culture ID Panel (Reflexed)     Status: Abnormal   Collection Time: 05/28/18  8:39 AM  Result Value Ref Range Status   Enterococcus species DETECTED (A) NOT DETECTED Final    Comment: CRITICAL RESULT CALLED TO, READ BACK BY AND VERIFIED WITH: PHARMD G ABBOTT [id number] MLM    Vancomycin resistance NOT DETECTED NOT DETECTED Final   Listeria monocytogenes NOT DETECTED NOT DETECTED Final   Staphylococcus species NOT DETECTED NOT DETECTED Final   Staphylococcus aureus (BCID) NOT DETECTED NOT DETECTED Final   Streptococcus species NOT DETECTED NOT DETECTED Final   Streptococcus agalactiae NOT DETECTED NOT DETECTED Final   Streptococcus pneumoniae NOT DETECTED NOT DETECTED Final   Streptococcus pyogenes NOT DETECTED NOT DETECTED Final   Acinetobacter baumannii NOT DETECTED NOT DETECTED Final   Enterobacteriaceae species NOT DETECTED NOT DETECTED Final   Enterobacter cloacae complex NOT DETECTED NOT DETECTED Final   Escherichia coli NOT DETECTED NOT DETECTED Final   Klebsiella oxytoca NOT DETECTED NOT DETECTED Final   Klebsiella pneumoniae NOT DETECTED NOT DETECTED Final   Proteus species NOT DETECTED NOT DETECTED Final   Serratia marcescens NOT DETECTED NOT DETECTED Final   Haemophilus influenzae NOT DETECTED NOT DETECTED Final   Neisseria meningitidis NOT DETECTED NOT DETECTED Final   Pseudomonas aeruginosa NOT DETECTED NOT DETECTED Final   Candida albicans NOT DETECTED NOT DETECTED Final   Candida glabrata NOT DETECTED NOT DETECTED Final   Candida krusei NOT DETECTED NOT DETECTED Final   Candida parapsilosis NOT DETECTED NOT DETECTED Final   Candida tropicalis NOT DETECTED NOT DETECTED Final    Comment: Performed at [facility], [address],  [place], [place] [address]  Respiratory Panel by PCR     Status: None   Collection Time: 05/28/18 12:38 PM  Result Value Ref Range Status   Adenovirus NOT DETECTED NOT DETECTED Final   Coronavirus 229E NOT  DETECTED NOT DETECTED Final   Coronavirus HKU1 NOT DETECTED NOT DETECTED Final   Coronavirus NL63 NOT DETECTED NOT DETECTED Final   Coronavirus OC43 NOT DETECTED NOT DETECTED Final   Metapneumovirus NOT DETECTED NOT DETECTED Final   Rhinovirus / Enterovirus NOT DETECTED NOT DETECTED Final   Influenza A NOT DETECTED NOT DETECTED Final   Influenza B NOT DETECTED NOT DETECTED Final   Parainfluenza Virus 1 NOT DETECTED NOT DETECTED Final   Parainfluenza Virus 2 NOT DETECTED NOT DETECTED Final   Parainfluenza Virus 3 NOT DETECTED NOT DETECTED Final   Parainfluenza Virus 4 NOT DETECTED NOT DETECTED Final   Respiratory Syncytial Virus NOT DETECTED NOT DETECTED Final   Bordetella pertussis NOT DETECTED NOT DETECTED Final   Chlamydophila pneumoniae NOT DETECTED NOT DETECTED Final   Mycoplasma pneumoniae NOT DETECTED NOT DETECTED Final    Microbiology: Recent Results (from the past 240 hour(s))  Blood Culture (routine x 2)     Status: None (Preliminary result)   Collection Time: 05/28/18  8:34 AM  Result Value Ref Range Status   Specimen Description BLOOD BLOOD RIGHT FOREARM  Final   Special Requests   Final    BOTTLES DRAWN AEROBIC AND ANAEROBIC Blood Culture adequate volume   Culture   Final    NO GROWTH < 12 HOURS Performed at [facility], [address] [place], [place] [address]    Report Status PENDING  Incomplete  Urine culture     Status: Abnormal   Collection Time: 05/28/18  8:34 AM  Result Value Ref Range Status   Specimen Description URINE, RANDOM  Final   Special Requests NONE  Final   Culture (A)  Final    <10,000 COLONIES/mL INSIGNIFICANT GROWTH Performed at [facility] [address], [place], [place] [address]    Report Status 05/29/2018 FINAL  Final  Blood Culture (routine x 2)     Status: None (Preliminary result)   Collection Time: 05/28/18  8:39 AM  Result Value Ref Range Status   Specimen Description BLOOD RIGHT ANTECUBITAL  Final   Special  Requests   Final    BOTTLES DRAWN AEROBIC AND ANAEROBIC Blood Culture adequate volume   Culture  Setup Time   Final    GRAM POSITIVE COCCI AEROBIC BOTTLE ONLY Organism ID to follow CRITICAL RESULT CALLED TO, READ BACK BY AND VERIFIED WITH: Salli Real [id number] MLM Performed at [facility], [place] [address] [place], [place] [address]    Culture GRAM POSITIVE COCCI  Final   Report Status PENDING  Incomplete  Blood Culture ID Panel (Reflexed)     Status: Abnormal   Collection Time: 05/28/18  8:39 AM  Result Value Ref Range Status   Enterococcus species DETECTED (A) NOT DETECTED Final    Comment: CRITICAL RESULT CALLED TO, READ BACK BY AND VERIFIED WITH: PHARMD G ABBOTT [id number] MLM    Vancomycin resistance NOT DETECTED NOT DETECTED Final   Listeria monocytogenes NOT DETECTED NOT DETECTED Final   Staphylococcus species NOT DETECTED NOT DETECTED Final   Staphylococcus aureus (BCID) NOT DETECTED NOT DETECTED Final   Streptococcus species NOT DETECTED NOT DETECTED Final   Streptococcus agalactiae NOT DETECTED NOT DETECTED Final   Streptococcus pneumoniae  NOT DETECTED NOT DETECTED Final   Streptococcus pyogenes NOT DETECTED NOT DETECTED Final   Acinetobacter baumannii NOT DETECTED NOT DETECTED Final   Enterobacteriaceae species NOT DETECTED NOT DETECTED Final   Enterobacter cloacae complex NOT DETECTED NOT DETECTED Final   Escherichia coli NOT DETECTED NOT DETECTED Final   Klebsiella oxytoca NOT DETECTED NOT DETECTED Final   Klebsiella pneumoniae NOT DETECTED NOT DETECTED Final   Proteus species NOT DETECTED NOT DETECTED Final   Serratia marcescens NOT DETECTED NOT DETECTED Final   Haemophilus influenzae NOT DETECTED NOT DETECTED Final   Neisseria meningitidis NOT DETECTED NOT DETECTED Final   Pseudomonas aeruginosa NOT DETECTED NOT DETECTED Final   Candida albicans NOT DETECTED NOT DETECTED Final   Candida glabrata NOT DETECTED NOT DETECTED Final   Candida krusei NOT  DETECTED NOT DETECTED Final   Candida parapsilosis NOT DETECTED NOT DETECTED Final   Candida tropicalis NOT DETECTED NOT DETECTED Final    Comment: Performed at [facility], [place] [address], [place], [place] [address]  Respiratory Panel by PCR     Status: None   Collection Time: 05/28/18 12:38 PM  Result Value Ref Range Status   Adenovirus NOT DETECTED NOT DETECTED Final   Coronavirus 229E NOT DETECTED NOT DETECTED Final   Coronavirus HKU1 NOT DETECTED NOT DETECTED Final   Coronavirus NL63 NOT DETECTED NOT DETECTED Final   Coronavirus OC43 NOT DETECTED NOT DETECTED Final   Metapneumovirus NOT DETECTED NOT DETECTED Final   Rhinovirus / Enterovirus NOT DETECTED NOT DETECTED Final   Influenza A NOT DETECTED NOT DETECTED Final   Influenza B NOT DETECTED NOT DETECTED Final   Parainfluenza Virus 1 NOT DETECTED NOT DETECTED Final   Parainfluenza Virus 2 NOT DETECTED NOT DETECTED Final   Parainfluenza Virus 3 NOT DETECTED NOT DETECTED Final   Parainfluenza Virus 4 NOT DETECTED NOT DETECTED Final   Respiratory Syncytial Virus NOT DETECTED NOT DETECTED Final   Bordetella pertussis NOT DETECTED NOT DETECTED Final   Chlamydophila pneumoniae NOT DETECTED NOT DETECTED Final   Mycoplasma pneumoniae NOT DETECTED NOT DETECTED Final    Radiographs and labs were personally reviewed by me.   Bobby Rumpf, MD [facility] [phone] 05/29/2018, 10:21 AM

## 2018-05-29 NOTE — Progress Notes (Signed)
Pt pre blood adm temp is 101.2, Physician instructed this RN  to give pt 650mg  Tylenol and adm blood.

## 2018-05-29 NOTE — Progress Notes (Addendum)
PHARMACY - PHYSICIAN COMMUNICATION CRITICAL VALUE ALERT - BLOOD CULTURE IDENTIFICATION (BCID)  Ricardo Watkins is an 84 y.o. male who presented to [facility] on 05/28/2018 with a chief complaint of Weakness and cough  Assessment:  84 yo patient found to have generalized weakness and fever. Patient pancytopenic. Blood cultures growing enterococcus (not vancomycin resistant).  Name of physician (or Provider) Contacted: Posey Pronto, P  Current antibiotics: Vancomycin and cefepime  Changes to prescribed antibiotics recommended:  Continue vancomycin at this time and d/c cefepime.     Results for orders placed or performed during the hospital encounter of 05/28/18  Blood Culture ID Panel (Reflexed) (Collected: 05/28/2018  8:39 AM)  Result Value Ref Range   Enterococcus species DETECTED (A) NOT DETECTED   Vancomycin resistance NOT DETECTED NOT DETECTED   Listeria monocytogenes NOT DETECTED NOT DETECTED   Staphylococcus species NOT DETECTED NOT DETECTED   Staphylococcus aureus (BCID) NOT DETECTED NOT DETECTED   Streptococcus species NOT DETECTED NOT DETECTED   Streptococcus agalactiae NOT DETECTED NOT DETECTED   Streptococcus pneumoniae NOT DETECTED NOT DETECTED   Streptococcus pyogenes NOT DETECTED NOT DETECTED   Acinetobacter baumannii NOT DETECTED NOT DETECTED   Enterobacteriaceae species NOT DETECTED NOT DETECTED   Enterobacter cloacae complex NOT DETECTED NOT DETECTED   Escherichia coli NOT DETECTED NOT DETECTED   Klebsiella oxytoca NOT DETECTED NOT DETECTED   Klebsiella pneumoniae NOT DETECTED NOT DETECTED   Proteus species NOT DETECTED NOT DETECTED   Serratia marcescens NOT DETECTED NOT DETECTED   Haemophilus influenzae NOT DETECTED NOT DETECTED   Neisseria meningitidis NOT DETECTED NOT DETECTED   Pseudomonas aeruginosa NOT DETECTED NOT DETECTED   Candida albicans NOT DETECTED NOT DETECTED   Candida glabrata NOT DETECTED NOT DETECTED   Candida krusei NOT DETECTED NOT DETECTED   Candida  parapsilosis NOT DETECTED NOT DETECTED   Candida tropicalis NOT DETECTED NOT DETECTED   Willo Yoon A. Levada Dy, PharmD, [place] Pager: [phone] Please utilize Amion for appropriate phone number to reach the unit pharmacist ([place])   05/29/2018  8:41 AM

## 2018-05-29 NOTE — Evaluation (Signed)
Clinical/Bedside Swallow Evaluation Patient Details  Name: Ricardo Watkins MRN: [id number] Date of Birth: 1930/10/18  Today's Date: 05/29/2018 Time: SLP Start Time (ACUTE ONLY): 6578 SLP Stop Time (ACUTE ONLY): 0849 SLP Time Calculation (min) (ACUTE ONLY): 7 min  Past Medical History:  Past Medical History:  Diagnosis Date  . Adenomatous colon polyp   . CAD (coronary artery disease)    30% LAD Stenosis, 70% ramus intermedius stenosis, treated with PTCA and angioplasty by Dr Albertine Patricia 2004  . Cataract    right eye  . CHF (congestive heart failure) ([place])   . Chronic ITP (idiopathic thrombocytopenia) ([facility])   . Cough, persistent 11/04/2015  . Diverticulosis   . DM (diabetes mellitus) ([place])   . DVT (deep venous thrombosis) ([place])    secondary to surgery  . Dyspnea   . Fever 05/01/2018  . Hyperlipidemia   . Hypertension   . Inguinal hernia    right  . Laceration of finger of right hand 05/01/2018   INDEX FINGER  . 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 Microcytic anemia 07/07/2015  . 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 Non Hodgkin's lymphoma ([place])   . Pleural effusion, left 11/04/2015  . Prostate CA ([place]) 09/10/2011   Gleason 3+3 R, 3+4 L lobe May 2007 Rx Radioactive seed implants Dr [name]  . PVD (peripheral vascular disease) ([place])    rt renal artery stent  . Renal insufficiency   . Thrombocytopenia ([place])   . Thrombotic stroke ([place]) 09/10/2011   January 18, 2011 infarct genu & post limb R internal capsule - acute; previous lacunar infarcts/extensive white matter dis  . Thyroid nodule    left lower lobe (annual monitoring).  . Vitamin D deficiency    Past Surgical History:  Past Surgical History:  Procedure Laterality Date  . APPENDECTOMY     patient ?  Marland Kitchen CARDIAC CATHETERIZATION    . CATARACT EXTRACTION    . CHEST TUBE INSERTION Left 02/10/2016   Procedure: INSERTION PLEURAL DRAINAGE CATHETER;  Surgeon: [name], MD;  Location: [place];   Service: Thoracic;  Laterality: Left;  . CHEST TUBE INSERTION Left 04/08/2016   Procedure: CHEST TUBE INSERTION;  Surgeon: [name], MD;  Location: [place];  Service: Thoracic;  Laterality: Left;  . COLONOSCOPY    . CORONARY ANGIOPLASTY    . CYSTOURETHROSCOPY     ROBOTIC ARM NUCLETRON SEED IMPLANTATION OF PROSTATE  . EXPLORATORY LAPAROTOMY     For evaluation of lymphoma  . REMOVAL OF PLEURAL DRAINAGE CATHETER Left 04/08/2016   Procedure: REMOVAL OF PLEURAL DRAINAGE CATHETER;  Surgeon: [name], MD;  Location: [facility];  Service: Thoracic;  Laterality: Left;   HPI:  Ricardo Watkins is a 84 y.o. male with medical history significant of PVD, remote prostate CA s/p seed radiation; NHL; HTN; HLD; DM; chronic ITP; CHF; and CAD presenting with chest pain and fever. per chart recent poor po intake. CXR New pulmonary density in the right apex and at both lung bases most consistent with pneumonia. Small associated left effusion. Family states this is first pneumonia dx this year and chart review corroberates.   Assessment / Plan / Recommendation Clinical Impression  No overt s/s aspiration observed during assessment. Current respiratory compromise increases risk as mildy increased work of breathing evident impacted by increased rate and liquid volume. Education given for swallow precautions. If pt has future admissions for pneumonia, would likely perform MBS.  Missing posterior upper and lower dentition however able to masticate without difficulty. Will follow briefly while here. Continue regular texture, thin liquids, pills with thin.   SLP Visit Diagnosis: Dysphagia, unspecified (R13.10)    Aspiration Risk  Mild aspiration risk    Diet Recommendation Regular;Thin liquid   Liquid Administration via: Cup;Straw Medication Administration: Whole meds with liquid Supervision: Patient able to self feed Compensations: Slow rate;Small sips/bites Postural Changes: Seated upright at 90 degrees     Other  Recommendations Oral Care Recommendations: Oral care BID   Follow up Recommendations None      Frequency and Duration min 1 x/week  2 weeks       Prognosis Prognosis for Safe Diet Advancement: Good      Swallow Study   General HPI: Ricardo Watkins is a 84 y.o. male with medical history significant of PVD, remote prostate CA s/p seed radiation; NHL; HTN; HLD; DM; chronic ITP; CHF; and CAD presenting with chest pain and fever. per chart recent poor po intake. CXR New pulmonary density in the right apex and at both lung bases most consistent with pneumonia. Small associated left effusion. Family states this is first pneumonia dx this year and chart review corroberates. Type of Study: Bedside Swallow Evaluation Previous Swallow Assessment: none Diet Prior to this Study: Regular;Thin liquids Temperature Spikes Noted: Yes Respiratory Status: Nasal cannula History of Recent Intubation: No Behavior/Cognition: Alert;Cooperative;Pleasant mood Oral Cavity Assessment: Within Functional Limits Oral Care Completed by SLP: No Oral Cavity - Dentition: Poor condition;Missing dentition Vision: Functional for self-feeding Self-Feeding Abilities: Able to feed self Patient Positioning: Upright in bed Baseline Vocal Quality: Normal Volitional Cough: Strong Volitional Swallow: Able to elicit    Oral/Motor/Sensory Function Overall Oral Motor/Sensory Function: Within functional limits   Ice Chips Ice chips: Not tested   Thin Liquid Thin Liquid: Within functional limits Presentation: Cup;Straw    Nectar Thick Nectar Thick Liquid: Not tested   Honey Thick Honey Thick Liquid: Not tested   Puree Puree: Not tested   Solid     Solid: Within functional limits      Houston Siren 05/29/2018,9:34 AM  Orbie Pyo Colvin Caroli.Ed Risk analyst [phone] Office [phone]

## 2018-05-29 NOTE — Progress Notes (Signed)
[facility] Coordinator will follow pt with ID team to support home infusion pharmacy services if IVABX are needed for home.   If patient discharges after hours, please call [phone].   Larry Sierras 05/29/2018, 5:22 PM

## 2018-05-29 NOTE — Progress Notes (Signed)
  Echocardiogram 2D Echocardiogram has been performed.  Denton Derks G Greenly Rarick 05/29/2018, 3:54 PM

## 2018-05-29 NOTE — Progress Notes (Signed)
PT Cancellation Note  Patient Details Name: Ricardo Watkins MRN: [id number] DOB: May 18, 1931   Cancelled Treatment:    Reason Eval/Treat Not Completed: Medical issues which prohibited therapy(Chart reviewed, evaluation held at this time d/t Hb: 6.5 outside of safe limits for PT eval at this time. WIll follow remotely and attempt again at later date/time. )   11:20 AM, 05/29/18 Etta Grandchild, PT, DPT Physical Therapist - [place] [phone] (Pager)  [phone] (Office)      Buccola,Allan C 05/29/2018, 11:20 AM

## 2018-05-29 NOTE — Progress Notes (Signed)
[facility] Progress Note  Patient: Ricardo Watkins BDZ:[id number]   PCP: [name], MD DOB: [birth year]   DOA: 05/28/2018   DOS: 05/29/2018   Date of Service: the patient was seen and examined on 05/29/2018  Brief hospital course: Pt. with PMH of PVD s/p R renal artery stent; remote prostate CA s/p seed radiation; NHL; HTN; HLD; DM; chronic ITP; CHF; and CAD s/p stent (recently deemed not an interventional candidate) presenting with 4 admission in 12 months and 13 ED visits in 12 months; admitted on 05/28/2018, presented with complaint of chills and chest pain, was found to have pneumonia and enterococcus bacteremia. Currently further plan is continue IV antibiotics.  Subjective: Reports constipation and abdominal cramps.  No nausea no vomiting.  No diarrhea reported.  The stool.  No burning urination per family with the patient reports that he has been having some burning when urinating for the last few months.  Chills at home no fever here in the hospital.  Assessment and Plan: 1.  Sepsis Secondary to help consider pneumonia. Secondary to enterococcus bacteremia. Blood cultures positive 1 out of 2 for enterococcus. Sensitivity currently pending. ID consulted appreciate input. Prior enterococcus in 2017 was pansensitive. Currently on Unasyn.  Repeat cultures. Source likely coming from lung but urine can also be a source although urine culture is not growing any significant bacteria. No other significant hardware in the body  2.  Myelodysplastic syndrome with 5 q. deletion. Symptomatic anemia. Chronic neutropenia. Chronic thrombocytopenia but currently platelets are stable. Hemoglobin 6.5, will transfuse 1 PRBC. Patient is on Nplate as well as EPO. Monitor  3.  CAD. Chronic combined diastolic and systolic CHF. Essential hypertension Echocardiogram September 19 shows EF 30 to 34% with diastolic dysfunction. Moderate hypokinesis of the inferior and apical  myocardium. Currently echocardiogram ordered for bacteremia. Continue aspirin, continue Plavix. Holding Lasix in the setting of renal function worsening. Continue Imdur, Toprol-XL for high blood pressure. Monitor for volume overload after receiving blood transfusion.  4.  Acute kidney injury on chronic kidney disease stage IV. Baseline renal function around 1.81.7. Currently serum creatinine 2.40. In the setting of sepsis. Holding diuretics. Monitor.  5. 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  Continue 20 uof Lantus. Adding sliding scale insulin. Lasting globin A1c 8.6. Continue current management and monitor  6.  Goals of care discussion. Frequent admissions to the hospital with different issues. Currently has bacteremia. Palliative care consult for further goals of care discussion. Patient is DNR/DNI for now.  Diet: Cardiac diet, aspiration precaution DVT Prophylaxis: subcutaneous Heparin  Advance goals of care discussion: DNR DNI  Family Communication: family was present at bedside, at the time of interview. The pt provided permission to discuss medical plan with the family. Opportunity was given to ask question and all questions were answered satisfactorily.   Disposition:  Discharge to be determined.  Consultants: Palliative care. ID Procedures: Echocardiogram   Scheduled Meds: . aspirin EC  81 mg Oral Daily  . clopidogrel  75 mg Oral Daily  . gabapentin  100 mg Oral QHS  . insulin aspart  0-5 Units Subcutaneous QHS  . insulin aspart  0-9 Units Subcutaneous TID WC  . insulin glargine  20 Units Subcutaneous q morning - 10a  . isosorbide mononitrate  60 mg Oral Daily  . metoprolol succinate  100 mg Oral Daily  . multivitamin with minerals  1 tablet Oral q morning - 10a  . polyethylene glycol  17 g Oral Daily  . pravastatin  40 mg Oral q1800  . senna-docusate  1 tablet Oral BID  .  tamsulosin  0.4 mg Oral Daily   Continuous Infusions: . ampicillin-sulbactam (UNASYN) IV     PRN Meds: acetaminophen, loperamide, meclizine, neomycin-bacitracin-polymyxin, nitroGLYCERIN, traMADol Antibiotics: Anti-infectives (From admission, onward)   Start     Dose/Rate Route Frequency Ordered Stop   05/29/18 1400  Ampicillin-Sulbactam (UNASYN) 3 g in sodium chloride 0.9 % 100 mL IVPB     3 g 200 mL/hr over 30 Minutes Intravenous Every 12 hours 05/29/18 1059     05/29/18 1000  vancomycin (VANCOCIN) IVPB 750 mg/150 ml premix  Status:  Discontinued     750 mg 150 mL/hr over 60 Minutes Intravenous Every 24 hours 05/28/18 1004 05/29/18 1052   05/28/18 2200  ceFEPIme (MAXIPIME) 1 g in sodium chloride 0.9 % 100 mL IVPB  Status:  Discontinued     1 g 200 mL/hr over 30 Minutes Intravenous Every 12 hours 05/28/18 1004 05/29/18 1052   05/28/18 0845  ceFEPIme (MAXIPIME) 2 g in sodium chloride 0.9 % 100 mL IVPB     2 g 200 mL/hr over 30 Minutes Intravenous  Once 05/28/18 0834 05/28/18 0931   05/28/18 0845  vancomycin (VANCOCIN) IVPB 1000 mg/200 mL premix  Status:  Discontinued     1,000 mg 200 mL/hr over 60 Minutes Intravenous  Once 05/28/18 0834 05/28/18 0839   05/28/18 0845  vancomycin (VANCOCIN) 1,500 mg in sodium chloride 0.9 % 500 mL IVPB     1,500 mg 250 mL/hr over 120 Minutes Intravenous  Once 05/28/18 0839 05/28/18 1156       Objective: Physical Exam: Vitals:   05/29/18 0758 05/29/18 0948 05/29/18 1230 05/29/18 1300  BP: (!) 137/48 (!) 140/46 (!) 133/58 (!) 187/73  Pulse: (!) 107 95 (!) 125 74  Resp:   18 18  Temp: 99.4 F (37.4 C)  (!) 101.1 F (38.4 C) 99.7 F (37.6 C)  TempSrc: Oral  Oral Oral  SpO2: 96%  97% 97%  Weight:      Height:        Intake/Output Summary (Last 24 hours) at 05/29/2018 1351 Last data filed at 05/28/2018 2358 Gross per 24 hour  Intake 100 ml  Output 500 ml  Net -400 ml   Filed Weights   05/28/18 0826  Weight: 83.4 kg   General: Alert,  Awake and Oriented to Time, Place and Person. Appear in moderate distress, affect appropriate Eyes: PERRL, Conjunctiva normal ENT: Oral Mucosa clear moist. Neck: no JVD, no Abnormal Mass Or lumps Cardiovascular: S1 and S2 Present, no Murmur, Peripheral Pulses Present Respiratory: increased respiratory effort, Bilateral Air entry equal and Decreased, no use of accessory muscle, bilateral  Crackles, no wheezes Abdomen: Bowel Sound present, Soft and no tenderness, no hernia Skin: no redness, no Rash, no induration Extremities: no Pedal edema, no calf tenderness Neurologic: Grossly no focal neuro deficit. Bilaterally Equal motor strength  Data Reviewed: CBC: Recent Labs  Lab 05/24/18 1425 05/28/18 0839 05/29/18 0539  WBC 2.7* 4.8 3.8*  NEUTROABS 0.5* 1.4* 1.2*  HGB 7.3* 7.7* 6.5*  HCT 24.7* 27.0* 22.0*  MCV 86.4 85.2 84.3  PLT 126* 144* PLATELET CLUMPS NOTED ON SMEAR, UNABLE TO ESTIMATE   Basic Metabolic Panel: Recent Labs  Lab 05/28/18 0839 05/29/18 0539  NA 141 142  K 3.9 3.9  CL 106 108  CO2 24 24  GLUCOSE 169* 179*  BUN 38* 39*  CREATININE  2.11* 2.40*  CALCIUM 8.7* 8.2*    Liver Function Tests: Recent Labs  Lab 05/28/18 0839  AST 15  ALT 16  ALKPHOS 71  BILITOT 0.7  PROT 7.9  ALBUMIN 2.9*   No results for input(s): LIPASE, AMYLASE in the last 168 hours. No results for input(s): AMMONIA in the last 168 hours. Coagulation Profile: No results for input(s): INR, PROTIME in the last 168 hours. Cardiac Enzymes: No results for input(s): CKTOTAL, CKMB, CKMBINDEX, TROPONINI in the last 168 hours. BNP (last 3 results) No results for input(s): PROBNP in the last 8760 hours. CBG: Recent Labs  Lab 05/28/18 1420 05/29/18 1202  GLUCAP 159* 249*   Studies: No results found.   Time spent: 35 minutes  Author: Berle Mull, MD [facility] Pager: [phone] 05/29/2018 1:51 PM  Between 7PM-7AM, please contact night-coverage at [email or web address], password  [facility]

## 2018-05-29 NOTE — Progress Notes (Signed)
Pharmacy Antibiotic Note  Ricardo Watkins is a 84 y.o. male admitted on 05/28/2018 with enterococcal bacteremia.  Pharmacy has been consulted for Unasyn dosing.Patient is currently pancytopenic with WBC count 3.8 and Tmax-102.3. Will need repeat blood cultures and TTE. SCr- 2.40 (Baseline: 1.7-1.8).   Plan: Unasyn 3 gm Q 12 hours F/U repeat blood cultures, TTE   Height: 5\' 9"  (175.3 cm) Weight: 183 lb 13.8 oz (83.4 kg) IBW/kg (Calculated) : 70.7  Temp (24hrs), Avg:98.5 F (36.9 C), Min:98 F (36.7 C), Max:99.4 F (37.4 C)  Recent Labs  Lab 05/24/18 1425 05/28/18 0839 05/28/18 0849 05/28/18 1038 05/29/18 0539 05/29/18 0949  WBC 2.7* 4.8  --   --  3.8*  --   CREATININE  --  2.11*  --   --  2.40*  --   LATICACIDVEN  --   --  1.43 0.52  --   --   VANCOTROUGH  --   --   --   --   --  10*    Estimated Creatinine Clearance: 22.5 mL/min (A) (by C-G formula based on SCr of 2.4 mg/dL (H)).    Allergies  Allergen Reactions  . Glucophage [Metformin Hydrochloride] Other (See Comments)    Chest pain  . Zetia [Ezetimibe] Other (See Comments)    weakness  . Fenofibrate Rash  . Niacin-Lovastatin Er Rash    Antimicrobials this admission: 12/8 Vanc>> 12/9 12/8 Cefepime >> 12/9 12/9 Unasyn>>    Microbiology results: 12/8 BCx: 1/2 enterococcus species    Thank you for allowing pharmacy to be a part of this patient's care.  Jimmy Footman, PharmD, BCPS, BCIDP Infectious Diseases Clinical Pharmacist Phone: [phone] 05/29/2018 11:01 AM

## 2018-05-30 ENCOUNTER — Telehealth: Payer: Self-pay | Admitting: Hematology and Oncology

## 2018-05-30 ENCOUNTER — Ambulatory Visit: Payer: Medicare Other | Admitting: Cardiology

## 2018-05-30 DIAGNOSIS — R7881 Bacteremia: Secondary | ICD-10-CM

## 2018-05-30 DIAGNOSIS — I999 Unspecified disorder of circulatory system: Secondary | ICD-10-CM

## 2018-05-30 DIAGNOSIS — L989 Disorder of the skin and subcutaneous tissue, unspecified: Secondary | ICD-10-CM

## 2018-05-30 DIAGNOSIS — J189 Pneumonia, unspecified organism: Secondary | ICD-10-CM

## 2018-05-30 DIAGNOSIS — Z515 Encounter for palliative care: Secondary | ICD-10-CM

## 2018-05-30 LAB — CBC WITH DIFFERENTIAL/PLATELET
Abs Immature Granulocytes: 0 10*3/uL (ref 0.00–0.07)
BASOS PCT: 0 %
Basophils Absolute: 0 10*3/uL (ref 0.0–0.1)
Eosinophils Absolute: 0 10*3/uL (ref 0.0–0.5)
Eosinophils Relative: 0 %
HEMATOCRIT: 28.9 % — AB (ref 39.0–52.0)
Hemoglobin: 9 g/dL — ABNORMAL LOW (ref 13.0–17.0)
LYMPHS ABS: 1.8 10*3/uL (ref 0.7–4.0)
Lymphocytes Relative: 38 %
MCH: 26.3 pg (ref 26.0–34.0)
MCHC: 31.1 g/dL (ref 30.0–36.0)
MCV: 84.5 fL (ref 80.0–100.0)
Metamyelocytes Relative: 1 %
Monocytes Absolute: 0.7 10*3/uL (ref 0.1–1.0)
Monocytes Relative: 15 %
Neutro Abs: 2 10*3/uL (ref 1.7–7.7)
Neutrophils Relative %: 42 %
Other: 4 %
Platelets: 112 10*3/uL — ABNORMAL LOW (ref 150–400)
RBC: 3.42 MIL/uL — ABNORMAL LOW (ref 4.22–5.81)
RDW: 20 % — ABNORMAL HIGH (ref 11.5–15.5)
WBC: 4.7 10*3/uL (ref 4.0–10.5)
nRBC: 0 % (ref 0.0–0.2)

## 2018-05-30 LAB — CBC
HCT: 29.2 % — ABNORMAL LOW (ref 39.0–52.0)
Hemoglobin: 8.7 g/dL — ABNORMAL LOW (ref 13.0–17.0)
MCH: 25.6 pg — ABNORMAL LOW (ref 26.0–34.0)
MCHC: 29.8 g/dL — ABNORMAL LOW (ref 30.0–36.0)
MCV: 85.9 fL (ref 80.0–100.0)
Platelets: 105 10*3/uL — ABNORMAL LOW (ref 150–400)
RBC: 3.4 MIL/uL — ABNORMAL LOW (ref 4.22–5.81)
RDW: 19.9 % — ABNORMAL HIGH (ref 11.5–15.5)
WBC: 3.9 10*3/uL — ABNORMAL LOW (ref 4.0–10.5)
nRBC: 0 % (ref 0.0–0.2)

## 2018-05-30 LAB — GLUCOSE, CAPILLARY
Glucose-Capillary: 133 mg/dL — ABNORMAL HIGH (ref 70–99)
Glucose-Capillary: 201 mg/dL — ABNORMAL HIGH (ref 70–99)
Glucose-Capillary: 189 mg/dL — ABNORMAL HIGH (ref 70–99)
Glucose-Capillary: 252 mg/dL — ABNORMAL HIGH (ref 70–99)

## 2018-05-30 LAB — TYPE AND SCREEN
ABO/RH(D): O POS
Antibody Screen: NEGATIVE
Unit division: 0
Unit division: 0

## 2018-05-30 LAB — HEMOGLOBIN A1C
Hgb A1c MFr Bld: 7.7 % — ABNORMAL HIGH (ref 4.8–5.6)
Mean Plasma Glucose: 174.29 mg/dL

## 2018-05-30 LAB — BPAM RBC
Blood Product Expiration Date: 202001052359
Blood Product Expiration Date: 202001062359
ISSUE DATE / TIME: 201912091233
ISSUE DATE / TIME: 201912092246
Unit Type and Rh: 5100
Unit Type and Rh: 5100

## 2018-05-30 LAB — BASIC METABOLIC PANEL
Anion gap: 11 (ref 5–15)
BUN: 40 mg/dL — ABNORMAL HIGH (ref 8–23)
CO2: 26 mmol/L (ref 22–32)
Calcium: 8.6 mg/dL — ABNORMAL LOW (ref 8.9–10.3)
Chloride: 106 mmol/L (ref 98–111)
Creatinine, Ser: 2.28 mg/dL — ABNORMAL HIGH (ref 0.61–1.24)
GFR calc Af Amer: 29 mL/min — ABNORMAL LOW (ref >60)
GFR calc non Af Amer: 25 mL/min — ABNORMAL LOW (ref >60)
Glucose, Bld: 161 mg/dL — ABNORMAL HIGH (ref 70–99)
Potassium: 3.9 mmol/L (ref 3.5–5.1)
Sodium: 143 mmol/L (ref 135–145)

## 2018-05-30 MED ORDER — ACETAMINOPHEN 325 MG PO TABS
325.0000 mg | ORAL_TABLET | Freq: Once | ORAL | Status: AC
  Administered 2018-05-30: 325 mg via ORAL
  Filled 2018-05-30: qty 1

## 2018-05-30 MED ORDER — SODIUM CHLORIDE 0.9 % IV SOLN
670.0000 mg | INTRAVENOUS | Status: DC
  Administered 2018-05-30: 670 mg via INTRAVENOUS
  Filled 2018-05-30: qty 13.4

## 2018-05-30 MED ORDER — NAPHAZOLINE-PHENIRAMINE 0.025-0.3 % OP SOLN
1.0000 [drp] | Freq: Every day | OPHTHALMIC | Status: DC
  Administered 2018-05-30 – 2018-06-12 (×12): 1 [drp] via OPHTHALMIC
  Filled 2018-05-30: qty 15

## 2018-05-30 MED ORDER — SODIUM CHLORIDE 0.9 % IV BOLUS
500.0000 mL | Freq: Once | INTRAVENOUS | Status: AC
  Administered 2018-05-30: 500 mL via INTRAVENOUS

## 2018-05-30 NOTE — Progress Notes (Signed)
[facility] Progress Note  Patient: Ricardo Watkins DDU:[id number]   PCP: [name], MD DOB: 09/22/1930   DOA: 05/28/2018   DOS: 05/30/2018   Date of Service: the patient was seen and examined on 05/30/2018  Brief hospital course: Pt. with PMH of PVD s/p R renal artery stent; remote prostate CA s/p seed radiation; NHL; HTN; HLD; DM; chronic ITP; CHF; and CAD s/p stent(recentlydeemed not an interventional candidate)presenting with4 admission in 12 months and 13 ED visits in 12 months; admitted on 05/28/2018, presented with complaint of chills and chest pain, was found to have pneumonia and enterococcus bacteremia.  S/P 2 PRBC on 05/29/2018 Currently further plan is continue IV antibiotics.  Subjective:  No nausea no vomiting.  Fatigue resolved.  No diarrhea. No acute bleeding, no acute events overnight.  Constipation resolved.  No more burning urination  Assessment and Plan: 1.  Sepsis Secondary to healthcare associated pneumonia. Secondary to enterococcus bacteremia. Blood cultures positive 1 out of 2 for enterococcus. Sensitivity currently pending. ID consulted appreciate input. Prior enterococcus in 2017 was pansensitive. Currently on Unasyn.  ID changed to daptomycin. Repeat cultures done on 05/29/2018 Source likely coming from lung but urine can also be a source although urine culture is not growing any significant bacteria. No other significant hardware in the body  2.  Myelodysplastic syndrome with 5 q. deletion. Symptomatic anemia. Chronic neutropenia. Chronic thrombocytopenia  currently platelets are stable. Hemoglobin 6.5,  S/P 2 PRBC transfusion on 05/29/2018. H&H now stable. Patient is on Nplate as well as EPO. Monitor  3.  CAD. Chronic combined diastolic and systolic CHF. Essential hypertension Echocardiogram September 19 shows EF 30 to 23% with diastolic dysfunction. Moderate hypokinesis of the inferior and apical myocardium. Currently  echocardiogram ordered for bacteremia. Continue aspirin, continue Plavix. Holding Lasix in the setting of renal function worsening. Continue Imdur, Toprol-XL for high blood pressure.  4.  Acute kidney injury on chronic kidney disease stage IV. Baseline renal function around 1.8-1.7. On admission serum creatinine 2.40.  Now trending down to 2.0. In the setting of sepsis. Holding diuretics.  Likely can resume tomorrow. Monitor.  5. Type 2 Diabetes Mellitus, uncontroled with renal complication and neuropathic complication Continue 20 u of Lantus. Adding sliding scale insulin. Last hemoglobin A1c 8.6 on 05/01/2018, 7.7 right now on 05/30/2018. Continue current management and monitor  6.  Goals of care discussion. Frequent admissions to the hospital with different issues. Currently has bacteremia. Palliative care consult for further goals of care discussion.  Appreciate their assistance. Patient is DNR/DNI for now.  Diet: regular diet thin DVT Prophylaxis: mechanical compression device  Advance goals of care discussion: DNR DNI  Family Communication: family was present at bedside, at the time of interview. The pt provided permission to discuss medical plan with the family. Opportunity was given to ask question and all questions were answered satisfactorily.   Disposition:  Discharge to be determined.  Consultants: ID, Palliative care  Procedures: Echocardiogram   Scheduled Meds: . sodium chloride   Intravenous Once  . aspirin EC  81 mg Oral Daily  . clopidogrel  75 mg Oral Daily  . gabapentin  100 mg Oral QHS  . insulin aspart  0-5 Units Subcutaneous QHS  . insulin aspart  0-9 Units Subcutaneous TID WC  . insulin glargine  20 Units Subcutaneous q morning - 10a  . isosorbide mononitrate  60 mg Oral Daily  . metoprolol succinate  100 mg Oral Daily  . multivitamin with minerals  1 tablet Oral q morning - 10a  . naphazoline-pheniramine  1 drop Both Eyes Daily  .  polyethylene glycol  17 g Oral Daily  . pravastatin  40 mg Oral q1800  . senna-docusate  1 tablet Oral BID  . tamsulosin  0.4 mg Oral Daily   Continuous Infusions: . DAPTOmycin (CUBICIN)  IV 670 mg (05/30/18 1506)   PRN Meds: acetaminophen, loperamide, meclizine, neomycin-bacitracin-polymyxin, nitroGLYCERIN, traMADol Antibiotics: Anti-infectives (From admission, onward)   Start     Dose/Rate Route Frequency Ordered Stop   05/30/18 1500  DAPTOmycin (CUBICIN) 670 mg in sodium chloride 0.9 % IVPB     670 mg 226.8 mL/hr over 30 Minutes Intravenous Every 48 hours 05/30/18 1327     05/29/18 1400  Ampicillin-Sulbactam (UNASYN) 3 g in sodium chloride 0.9 % 100 mL IVPB  Status:  Discontinued     3 g 200 mL/hr over 30 Minutes Intravenous Every 12 hours 05/29/18 1059 05/30/18 1326   05/29/18 1000  vancomycin (VANCOCIN) IVPB 750 mg/150 ml premix  Status:  Discontinued     750 mg 150 mL/hr over 60 Minutes Intravenous Every 24 hours 05/28/18 1004 05/29/18 1052   05/28/18 2200  ceFEPIme (MAXIPIME) 1 g in sodium chloride 0.9 % 100 mL IVPB  Status:  Discontinued     1 g 200 mL/hr over 30 Minutes Intravenous Every 12 hours 05/28/18 1004 05/29/18 1052   05/28/18 0845  ceFEPIme (MAXIPIME) 2 g in sodium chloride 0.9 % 100 mL IVPB     2 g 200 mL/hr over 30 Minutes Intravenous  Once 05/28/18 0834 05/28/18 0931   05/28/18 0845  vancomycin (VANCOCIN) IVPB 1000 mg/200 mL premix  Status:  Discontinued     1,000 mg 200 mL/hr over 60 Minutes Intravenous  Once 05/28/18 0834 05/28/18 0839   05/28/18 0845  vancomycin (VANCOCIN) 1,500 mg in sodium chloride 0.9 % 500 mL IVPB     1,500 mg 250 mL/hr over 120 Minutes Intravenous  Once 05/28/18 0839 05/28/18 1156       Objective: Physical Exam: Vitals:   05/29/18 2312 05/29/18 2345 05/30/18 0305 05/30/18 0749  BP: (!) 137/44 131/62 127/63 139/64  Pulse:  (!) 102 88 (!) 103  Resp: (!) 24 (!) 24 20 20   Temp: 99.4 F (37.4 C) 100.3 F (37.9 C) 98.3 F (36.8 C)  98.6 F (37 C)  TempSrc: Oral Axillary Oral Oral  SpO2: 99% 92% 97% 97%  Weight:      Height:        Intake/Output Summary (Last 24 hours) at 05/30/2018 1513 Last data filed at 05/30/2018 0400 Gross per 24 hour  Intake 415 ml  Output 700 ml  Net -285 ml   Filed Weights   05/28/18 0826  Weight: 83.4 kg   General: Alert, Awake and Oriented to Time, Place and Person. Appear in mild distress, affect appropriate Eyes: PERRL, Conjunctiva normal ENT: Oral Mucosa clear moist. Neck: no JVD, no Abnormal Mass Or lumps Cardiovascular: S1 and S2 Present, aortic systolic  Murmur, Peripheral Pulses Present Respiratory: normal respiratory effort, Bilateral Air entry equal and Decreased, no use of accessory muscle, Clear to Auscultation, no Crackles, no wheezes Abdomen: Bowel Sound present, Soft and no tenderness, no hernia Skin: no redness, no Rash, no induration Extremities: no Pedal edema, no calf tenderness Neurologic: Grossly no focal neuro deficit. Bilaterally Equal motor strength  Data Reviewed: CBC: Recent Labs  Lab 05/24/18 1425 05/28/18 0839 05/29/18 0539 05/29/18 1715 05/30/18 0645 05/30/18 0831  WBC 2.7*  4.8 3.8* 4.8 3.9* 4.7  NEUTROABS 0.5* 1.4* 1.2*  --   --  2.0  HGB 7.3* 7.7* 6.5* 6.8* 8.7* 9.0*  HCT 24.7* 27.0* 22.0* 22.4* 29.2* 28.9*  MCV 86.4 85.2 84.3 83.9 85.9 84.5  PLT 126* 144* PLATELET CLUMPS NOTED ON SMEAR, UNABLE TO ESTIMATE 126* 105* 383*   Basic Metabolic Panel: Recent Labs  Lab 05/28/18 0839 05/29/18 0539 05/30/18 0645  NA 141 142 143  K 3.9 3.9 3.9  CL 106 108 106  CO2 24 24 26   GLUCOSE 169* 179* 161*  BUN 38* 39* 40*  CREATININE 2.11* 2.40* 2.28*  CALCIUM 8.7* 8.2* 8.6*    Liver Function Tests: Recent Labs  Lab 05/28/18 0839  AST 15  ALT 16  ALKPHOS 71  BILITOT 0.7  PROT 7.9  ALBUMIN 2.9*   No results for input(s): LIPASE, AMYLASE in the last 168 hours. No results for input(s): AMMONIA in the last 168 hours. Coagulation  Profile: No results for input(s): INR, PROTIME in the last 168 hours. Cardiac Enzymes: No results for input(s): CKTOTAL, CKMB, CKMBINDEX, TROPONINI in the last 168 hours. BNP (last 3 results) No results for input(s): PROBNP in the last 8760 hours. CBG: Recent Labs  Lab 05/29/18 1202 05/29/18 1715 05/29/18 2145 05/30/18 0751 05/30/18 1109  GLUCAP 249* 166* 179* 133* 201*   Studies: No results found.   Time spent: 35 minutes  Author: Berle Mull, MD [facility] Pager: [phone] 05/30/2018 3:13 PM  Between 7PM-7AM, please contact night-coverage at [email or web address], password [facility]

## 2018-05-30 NOTE — Progress Notes (Signed)
05/30/2018 SATURATION QUALIFICATIONS: (This note is used to comply with regulatory documentation for home oxygen)  Patient Saturations on Room Air at Rest = 91%  Patient Saturations on Room Air while Ambulating = 85%  Patient Saturations on 2 Liters of oxygen while Ambulating = 94%  Please briefly explain why patient needs home oxygen: Pt desaturates on RA while walking and needs O2 to keep his sats above 90%.    Barbarann Ehlers Scout Guyett, PT, DPT  Acute Rehabilitation [phone] pager [phone]) [phone] office

## 2018-05-30 NOTE — Progress Notes (Addendum)
Subjective: No new complaints   Antibiotics:  Anti-infectives (From admission, onward)   Start     Dose/Rate Route Frequency Ordered Stop   05/29/18 1400  Ampicillin-Sulbactam (UNASYN) 3 g in sodium chloride 0.9 % 100 mL IVPB     3 g 200 mL/hr over 30 Minutes Intravenous Every 12 hours 05/29/18 1059     05/29/18 1000  vancomycin (VANCOCIN) IVPB 750 mg/150 ml premix  Status:  Discontinued     750 mg 150 mL/hr over 60 Minutes Intravenous Every 24 hours 05/28/18 1004 05/29/18 1052   05/28/18 2200  ceFEPIme (MAXIPIME) 1 g in sodium chloride 0.9 % 100 mL IVPB  Status:  Discontinued     1 g 200 mL/hr over 30 Minutes Intravenous Every 12 hours 05/28/18 1004 05/29/18 1052   05/28/18 0845  ceFEPIme (MAXIPIME) 2 g in sodium chloride 0.9 % 100 mL IVPB     2 g 200 mL/hr over 30 Minutes Intravenous  Once 05/28/18 0834 05/28/18 0931   05/28/18 0845  vancomycin (VANCOCIN) IVPB 1000 mg/200 mL premix  Status:  Discontinued     1,000 mg 200 mL/hr over 60 Minutes Intravenous  Once 05/28/18 0834 05/28/18 0839   05/28/18 0845  vancomycin (VANCOCIN) 1,500 mg in sodium chloride 0.9 % 500 mL IVPB     1,500 mg 250 mL/hr over 120 Minutes Intravenous  Once 05/28/18 0839 05/28/18 1156      Medications: Scheduled Meds: . sodium chloride   Intravenous Once  . aspirin EC  81 mg Oral Daily  . clopidogrel  75 mg Oral Daily  . gabapentin  100 mg Oral QHS  . insulin aspart  0-5 Units Subcutaneous QHS  . insulin aspart  0-9 Units Subcutaneous TID WC  . insulin glargine  20 Units Subcutaneous q morning - 10a  . isosorbide mononitrate  60 mg Oral Daily  . metoprolol succinate  100 mg Oral Daily  . multivitamin with minerals  1 tablet Oral q morning - 10a  . naphazoline-pheniramine  1 drop Both Eyes Daily  . polyethylene glycol  17 g Oral Daily  . pravastatin  40 mg Oral q1800  . senna-docusate  1 tablet Oral BID  . tamsulosin  0.4 mg Oral Daily   Continuous Infusions: . ampicillin-sulbactam  (UNASYN) IV 3 g (05/30/18 0319)   PRN Meds:.acetaminophen, loperamide, meclizine, neomycin-bacitracin-polymyxin, nitroGLYCERIN, traMADol    Objective: Weight change:   Intake/Output Summary (Last 24 hours) at 05/30/2018 1225 Last data filed at 05/30/2018 0400 Gross per 24 hour  Intake 415 ml  Output 700 ml  Net -285 ml   Blood pressure 139/64, pulse (!) 103, temperature 98.6 F (37 C), temperature source Oral, resp. rate 20, height 5\' 9"  (1.753 m), weight 83.4 kg, SpO2 97 %. Temp:  [98.2 F (36.8 C)-101.1 F (38.4 C)] 98.6 F (37 C) (12/10 0749) Pulse Rate:  [74-125] 103 (12/10 0749) Resp:  [18-24] 20 (12/10 0749) BP: (117-187)/(44-73) 139/64 (12/10 0749) SpO2:  [92 %-99 %] 97 % (12/10 0749)  Physical Exam: General: Alert and awake, oriented x3, not in any acute distress. HEENT: anicteric sclera, EOMI CVS regular rate, normal  Chest: , no wheezing, no respiratory distress Abdomen: soft non-distended,  Extremities: no edema or deformity noted bilaterally Skin:   Lesion on finger 05/30/2018:    Feet are clear of any lesions     Neuro: nonfocal  CBC:    BMET Recent Labs    05/29/18 0539 05/30/18 0645  NA 142 143  K 3.9 3.9  CL 108 106  CO2 24 26  GLUCOSE 179* 161*  BUN 39* 40*  CREATININE 2.40* 2.28*  CALCIUM 8.2* 8.6*     Liver Panel  Recent Labs    05/28/18 0839  PROT 7.9  ALBUMIN 2.9*  AST 15  ALT 16  ALKPHOS 71  BILITOT 0.7       Sedimentation Rate No results for input(s): ESRSEDRATE in the last 72 hours. C-Reactive Protein No results for input(s): CRP in the last 72 hours.  Micro Results: Recent Results (from the past 720 hour(s))  Culture, blood (routine x 2)     Status: None   Collection Time: 05/01/18  4:56 PM  Result Value Ref Range Status   Specimen Description BLOOD LEFT ANTECUBITAL  Final   Special Requests   Final    BOTTLES DRAWN AEROBIC ONLY Blood Culture adequate volume   Culture   Final    NO GROWTH 5  DAYS Performed at [facility], [address], [place], [place] [address]    Report Status 05/06/2018 FINAL  Final  Culture, blood (routine x 2)     Status: None   Collection Time: 05/01/18  4:56 PM  Result Value Ref Range Status   Specimen Description BLOOD RIGHT ANTECUBITAL  Final   Special Requests   Final    BOTTLES DRAWN AEROBIC ONLY Blood Culture adequate volume   Culture   Final    NO GROWTH 5 DAYS Performed at [facility], [place] [address], [place], [place] [address]    Report Status 05/06/2018 FINAL  Final  Blood Culture (routine x 2)     Status: None (Preliminary result)   Collection Time: 05/28/18  8:34 AM  Result Value Ref Range Status   Specimen Description BLOOD BLOOD RIGHT FOREARM  Final   Special Requests   Final    BOTTLES DRAWN AEROBIC AND ANAEROBIC Blood Culture adequate volume   Culture   Final    NO GROWTH 1 DAY Performed at [facility], [place] [address], [place], [place] [address]    Report Status PENDING  Incomplete  Urine culture     Status: Abnormal   Collection Time: 05/28/18  8:34 AM  Result Value Ref Range Status   Specimen Description URINE, RANDOM  Final   Special Requests NONE  Final   Culture (A)  Final    <10,000 COLONIES/mL INSIGNIFICANT GROWTH Performed at [place] [address], [place], [place] [address]    Report Status 05/29/2018 FINAL  Final  Blood Culture (routine x 2)     Status: None (Preliminary result)   Collection Time: 05/28/18  8:39 AM  Result Value Ref Range Status   Specimen Description BLOOD RIGHT ANTECUBITAL  Final   Special Requests   Final    BOTTLES DRAWN AEROBIC AND ANAEROBIC Blood Culture adequate volume   Culture  Setup Time   Final    GRAM POSITIVE COCCI AEROBIC BOTTLE ONLY CRITICAL RESULT CALLED TO, READ BACK BY AND VERIFIED WITH: Salli Real [id number] MLM Performed at [facility], [place] [address], [place],  [address]    Culture [facility] POSITIVE COCCI  Final    Report Status PENDING  Incomplete  Blood Culture ID Panel (Reflexed)     Status: Abnormal   Collection Time: 05/28/18  8:39 AM  Result Value Ref Range Status   Enterococcus species DETECTED (A) NOT DETECTED Final  Comment: CRITICAL RESULT CALLED TO, READ BACK BY AND VERIFIED WITH: PHARMD G ABBOTT [id number] MLM    Vancomycin resistance NOT DETECTED NOT DETECTED Final   Listeria monocytogenes NOT DETECTED NOT DETECTED Final   Staphylococcus species NOT DETECTED NOT DETECTED Final   Staphylococcus aureus (BCID) NOT DETECTED NOT DETECTED Final   Streptococcus species NOT DETECTED NOT DETECTED Final   Streptococcus agalactiae NOT DETECTED NOT DETECTED Final   Streptococcus pneumoniae NOT DETECTED NOT DETECTED Final   Streptococcus pyogenes NOT DETECTED NOT DETECTED Final   Acinetobacter baumannii NOT DETECTED NOT DETECTED Final   Enterobacteriaceae species NOT DETECTED NOT DETECTED Final   Enterobacter cloacae complex NOT DETECTED NOT DETECTED Final   Escherichia coli NOT DETECTED NOT DETECTED Final   Klebsiella oxytoca NOT DETECTED NOT DETECTED Final   Klebsiella pneumoniae NOT DETECTED NOT DETECTED Final   Proteus species NOT DETECTED NOT DETECTED Final   Serratia marcescens NOT DETECTED NOT DETECTED Final   Haemophilus influenzae NOT DETECTED NOT DETECTED Final   Neisseria meningitidis NOT DETECTED NOT DETECTED Final   Pseudomonas aeruginosa NOT DETECTED NOT DETECTED Final   Candida albicans NOT DETECTED NOT DETECTED Final   Candida glabrata NOT DETECTED NOT DETECTED Final   Candida krusei NOT DETECTED NOT DETECTED Final   Candida parapsilosis NOT DETECTED NOT DETECTED Final   Candida tropicalis NOT DETECTED NOT DETECTED Final    Comment: Performed at [facility], [address] [place], [place] [address]  Respiratory Panel by PCR     Status: None   Collection Time: 05/28/18 12:38 PM  Result Value Ref Range Status   Adenovirus NOT DETECTED NOT DETECTED Final    Coronavirus 229E NOT DETECTED NOT DETECTED Final   Coronavirus HKU1 NOT DETECTED NOT DETECTED Final   Coronavirus NL63 NOT DETECTED NOT DETECTED Final   Coronavirus OC43 NOT DETECTED NOT DETECTED Final   Metapneumovirus NOT DETECTED NOT DETECTED Final   Rhinovirus / Enterovirus NOT DETECTED NOT DETECTED Final   Influenza A NOT DETECTED NOT DETECTED Final   Influenza B NOT DETECTED NOT DETECTED Final   Parainfluenza Virus 1 NOT DETECTED NOT DETECTED Final   Parainfluenza Virus 2 NOT DETECTED NOT DETECTED Final   Parainfluenza Virus 3 NOT DETECTED NOT DETECTED Final   Parainfluenza Virus 4 NOT DETECTED NOT DETECTED Final   Respiratory Syncytial Virus NOT DETECTED NOT DETECTED Final   Bordetella pertussis NOT DETECTED NOT DETECTED Final   Chlamydophila pneumoniae NOT DETECTED NOT DETECTED Final   Mycoplasma pneumoniae NOT DETECTED NOT DETECTED Final    Studies/Results: No results found.    Assessment/Plan:  INTERVAL HISTORY: Repeat blood cultures incubating   Principal Problem:   HCAP (healthcare-associated pneumonia) Active Problems:   CAD (coronary artery disease)   Essential hypertension   Chronic renal failure, stage 3 (moderate) (HCC)   MDS (myelodysplastic syndrome) with 5q deletion (HCC)   Sepsis (HCC)   Type 2 diabetes mellitus (HCC)   CHF (congestive heart failure) (HCC)   Goals of care, counseling/discussion    Ricardo Watkins is a 84 y.o. male with  With vascular disease, myelodysplastic syndrome coronary artery disease ITP who is been admitted with multifocal pneumonia and enterococcal bacteremia.  #1 multifocal pneumonia with enterococcal bacteremia   Continue Unasyn  Follow-up blood cultures  Would not hazard a transesophageal echocardiogram  Would rather treat him with 2 weeks of antimicrobials  ADDENDUM:  There is have subsequently revealed the organism to be enterococcus few centimeters.  We will add daptomycin  while we await susceptibilities.    LOS: 2 days   Alcide Evener 05/30/2018, 12:25 PM

## 2018-05-30 NOTE — Telephone Encounter (Signed)
Called patient per 12/10 sch message - to r/s - no answer - left message for patient to call back to r/s

## 2018-05-30 NOTE — Progress Notes (Signed)
Pharmacy Antibiotic Note  Ricardo Watkins is a 84 y.o. male admitted on 05/28/2018 with enterococcal bacteremia.  Pharmacy has been consulted for daptomycin  Dosing. Patient has been treated with Unasyn, however, today his cultures reveal enterococcus faeciium which is typically resistant to ampicillin so we will broaden therapy until susceptibilities return. WBC up to 4.7 and patient Tmax 100.3. Repeat blood cultures are in process. TTE negative for vegetations.   Plan: Daptomycin 670 mg (~ 8 mg/kg) every 48 hours Weekly CK on Wednesdays F/U cultures and susceptibility   Height: 5\' 9"  (175.3 cm) Weight: 183 lb 13.8 oz (83.4 kg) IBW/kg (Calculated) : 70.7  Temp (24hrs), Avg:98.9 F (37.2 C), Min:98.2 F (36.8 C), Max:100.3 F (37.9 C)  Recent Labs  Lab 05/28/18 0839 05/28/18 0849 05/28/18 1038 05/29/18 0539 05/29/18 0949 05/29/18 1715 05/30/18 0645 05/30/18 0831  WBC 4.8  --   --  3.8*  --  4.8 3.9* 4.7  CREATININE 2.11*  --   --  2.40*  --   --  2.28*  --   LATICACIDVEN  --  1.43 0.52  --   --   --   --   --   VANCOTROUGH  --   --   --   --  10*  --   --   --     Estimated Creatinine Clearance: 23.7 mL/min (A) (by C-G formula based on SCr of 2.28 mg/dL (H)).    Allergies  Allergen Reactions  . Glucophage [Metformin Hydrochloride] Other (See Comments)    Chest pain  . Zetia [Ezetimibe] Other (See Comments)    weakness  . Fenofibrate Rash  . Niacin-Lovastatin Er Rash      Thank you for allowing pharmacy to be a part of this patient's care.  Jimmy Footman, PharmD, BCPS, BCIDP Infectious Diseases Clinical Pharmacist Phone: [phone] 05/30/2018 1:27 PM

## 2018-05-30 NOTE — Evaluation (Signed)
Physical Therapy Evaluation Patient Details Name: Ricardo Watkins MRN: [id number] DOB: [birth year] Today's Date: 05/30/2018   History of Present Illness  84 y.o. male admitted on 05/28/18 for weakness and cough.  Found to have sepsis due to HCAP with enterococcus bacteremia, myelodysplastic syndrome, symptomatic anemia s/p 2 Units PRBC, acute kidney injury on chronic kidney disease.  Pt with other significant PMH of DM 2, CKD, CAD, essential HTN, chronic combined diastolic and systolic CHF, chronic neutropenia, chronic thrombocytopenia, DVT, non  Hodgkin's lymphoma, stroke, and PVD.  Clinical Impression  Pt is weak on his feet requiring a RW vs his normal assistive device, a cane.  He does desaturate on RA during gait (down to 85%).  I spoke with pt and his family about the next steps for therapy and they are interested in [facility] therapies, not SNF for rehab.  The daughter and son where present and live very close by and can help the pt and his wife once home.  PT will continue to follow acutely for safe mobility progression    Follow Up Recommendations Home health PT    Equipment Recommendations  Other (comment)(possible need for home O2)    Recommendations for Other Services OT consult     Precautions / Restrictions Precautions Precautions: Fall;Other (comment) Precaution Comments: monitor O2 sats      Mobility  Bed Mobility Overal bed mobility: Needs Assistance Bed Mobility: Supine to Sit     Supine to sit: Min assist     General bed mobility comments: Min assist to support trunk to get to EOB, extra time needed to complete the task.  Transfers Overall transfer level: Needs assistance Equipment used: Rolling walker (2 wheeled) Transfers: Sit to/from Stand Sit to Stand: Min assist         General transfer comment: Min assist to support trunk to power up to standing.   Ambulation/Gait Ambulation/Gait assistance: Min assist Gait Distance (Feet): 20 Feet Assistive device:  Rolling walker (2 wheeled) Gait Pattern/deviations: Step-through pattern;Shuffle;Trunk flexed     General Gait Details: Pt with flexed trunk gait pattern, shuffling, needs cues to keep feet inside the RW.  Very weak, fatigues quickly.         Balance Overall balance assessment: Needs assistance Sitting-balance support: Feet supported;Bilateral upper extremity supported Sitting balance-Leahy Scale: Fair     Standing balance support: Bilateral upper extremity supported Standing balance-Leahy Scale: Poor Standing balance comment: needs support of RW and therapist in standing.                              Pertinent Vitals/Pain Pain Assessment: No/denies pain    Home Living Family/patient expects to be discharged to:: Private residence Living Arrangements: Spouse/significant other;Children Available Help at Discharge: Family;Available 24 hours/day Type of Home: House Home Access: Ramped entrance     Home Layout: One level Home Equipment: Walker - 2 wheels;Cane - single point;Shower seat      Prior Function Level of Independence: Independent with assistive device(s)         Comments: Per pt and wife he uses a cane all the time for gait. Per daughter, MD has been trying to get him to use a RW for a while now and he will not.         Extremity/Trunk Assessment   Upper Extremity Assessment Upper Extremity Assessment: Generalized weakness    Lower Extremity Assessment Lower Extremity Assessment: Generalized weakness    Cervical /  Trunk Assessment Cervical / Trunk Assessment: Normal  Communication   Communication: HOH  Cognition Arousal/Alertness: Awake/alert Behavior During Therapy: WFL for tasks assessed/performed Overall Cognitive Status: Within Functional Limits for tasks assessed                                 General Comments: No obvious cognitive deficits.  Not specifically tested.      General Comments General comments (skin  integrity, edema, etc.): O2 sats on RA were 91% at rest, with gait 85%, On 2 L O2 [place] 94%.          Assessment/Plan    PT Assessment Patient needs continued PT services  PT Problem List Decreased strength;Decreased balance;Decreased activity tolerance;Decreased mobility;Decreased knowledge of use of DME;Decreased knowledge of precautions;Cardiopulmonary status limiting activity       PT Treatment Interventions DME instruction;Gait training;Functional mobility training;Therapeutic activities;Balance training;Therapeutic exercise;Patient/family education    PT Goals (Current goals can be found in the Care Plan section)  Acute Rehab PT Goals Patient Stated Goal: to get stronger and go home PT Goal Formulation: With patient/family Time For Goal Achievement: 06/13/18 Potential to Achieve Goals: Good    Frequency Min 3X/week           AM-PAC PT "6 Clicks" Mobility  Outcome Measure Help needed turning from your back to your side while in a flat bed without using bedrails?: A Little Help needed moving from lying on your back to sitting on the side of a flat bed without using bedrails?: A Little Help needed moving to and from a bed to a chair (including a wheelchair)?: A Little Help needed standing up from a chair using your arms (e.g., wheelchair or bedside chair)?: A Little Help needed to walk in hospital room?: A Little Help needed climbing 3-5 steps with a railing? : A Lot 6 Click Score: 17    End of Session Equipment Utilized During Treatment: Gait belt Activity Tolerance: Patient limited by fatigue Patient left: in chair;with call bell/phone within reach;with family/visitor present Nurse Communication: Mobility status;Other (comment)(O2 sats dropped. ) PT Visit Diagnosis: Muscle weakness (generalized) (M62.81);Difficulty in walking, not elsewhere classified (R26.2)    Time: 9242-6834 PT Time Calculation (min) (ACUTE ONLY): 15 min   Charges:         Wells Guiles B.  Hisao Doo, PT, DPT  Acute Rehabilitation #[phone] pager #[phone] office   PT Evaluation $PT Eval Moderate Complexity: 1 Mod         05/30/2018, 4:31 PM

## 2018-05-30 NOTE — Progress Notes (Signed)
  Speech Language Pathology Treatment: Dysphagia  Patient Details Name: Ricardo Watkins MRN: [id number] DOB: [birth year] Today's Date: 05/30/2018 Time: 6834-1962 SLP Time Calculation (min) (ACUTE ONLY): 8 min  Assessment / Plan / Recommendation Clinical Impression  Demonstrated prolonged mastication with cantaloupe unable to clear with liquid wash and cues to expectorate. No cough, throat clear or wet vocal quality with solids or liquids and reports coughing this morning prior to breakfast. Mild dyspnea and reminded to take breaks when needed. Daughter reports he is doing better since following strategies, although cues needed for improved positioning on therapists arrival. Continue regular texture using caution with harder solids. ST will sign off.    HPI HPI: Ricardo Watkins is a 84 y.o. male with medical history significant of PVD, remote prostate CA s/p seed radiation; NHL; HTN; HLD; DM; chronic ITP; CHF; and CAD presenting with chest pain and fever. per chart recent poor po intake. CXR New pulmonary density in the right apex and at both lung bases most consistent with pneumonia. Small associated left effusion. Family states this is first pneumonia dx this year and chart review corroberates.      SLP Plan  All goals met;Discharge SLP treatment due to (comment)       Recommendations  Diet recommendations: Regular;Thin liquid Liquids provided via: Cup;Straw Medication Administration: Whole meds with liquid Supervision: Patient able to self feed;Intermittent supervision to cue for compensatory strategies Compensations: Slow rate;Small sips/bites;Lingual sweep for clearance of pocketing Postural Changes and/or Swallow Maneuvers: Seated upright 90 degrees                Oral Care Recommendations: Oral care BID Follow up Recommendations: None SLP Visit Diagnosis: Dysphagia, unspecified (R13.10) Plan: All goals met;Discharge SLP treatment due to (comment)       GO                 Houston Siren 05/30/2018, 9:15 AM  Orbie Pyo Colvin Caroli.Ed Risk analyst [phone] Office [phone]

## 2018-05-30 NOTE — Progress Notes (Signed)
   05/30/18 0959  Clinical Encounter Type  Visited With Patient and family together  Visit Type Initial  Referral From Palliative care team  Consult/Referral To Chaplain  The chaplain was warmly welcomed into the Pt. room by the Pt. and his wife.  The couple is celebrating 27 years of marriage. The relationship is evident in the caregiver's presence and attention to detail for the Pt.  The Pt. wife mentioned two adult children may be available to reduce some of the caregiver responsibility. The Pt. and Pt. Wife are willing to have a F/U spiritual care visit with the chaplain.  The Pt. pastor stopped by for a visit as the chaplain was exiting.

## 2018-05-30 NOTE — Consult Note (Signed)
Consultation Note Date: 05/30/2018   Patient Name: Ricardo Watkins  DOB: 01-01-1931  MRN: [id number]  Age / Sex: 84 y.o., male  PCP: [name], MD Referring Physician: Lavina Hamman, MD  Reason for Consultation: Establishing goals of care and Psychosocial/spiritual support  HPI/Patient Profile: 84 y.o. male  with past medical history of MDS, CKD3, HF with an EF of 30-35%, PVD s/p renal stenting, prostate cancer s/p radiation, and CAD no appropriate for invasive therapies - who was admitted on 05/28/2018 with chest pain and a productive cough.  He was found to have bacteremia and pneumonia.  Ricardo Watkins has had 13 ED visits in the last year and 4 hospital admissions in 3 months.   Clinical Assessment and Goals of Care:  I have reviewed medical records including EPIC notes, labs and imaging, received report from the care team, assessed the patient and then met at the bedside along with his wife Ricardo Watkins  to discuss diagnosis prognosis, GOC, EOL wishes, disposition and options.  After our meeting I spoke on the phone with his daughter Ricardo Watkins.  I introduced Palliative Medicine as specialized medical care for people living with serious illness. It focuses on providing relief from the symptoms and stress of a serious illness. The goal is to improve quality of life for both the patient and the family.  We discussed a brief life review of the patient.  His career was working for [employer].  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  He tells me with pride he bought a new tractor last month.  He and Ricardo Bers (Wife) have 4 children and 7 grandchildren.  The family all lives in [place] and is very close knit.  Ricardo Watkins is active with a non-denominational christian church.  As far as functional and nutritional status he is still walking and eating and independent with ADLs.    We  discussed his current illness and what it means in the larger context of their on-going co-morbidities.  Natural disease trajectory and expectations at EOL were discussed.  Specifically I am concerned that his underlying MDS and weakened heart may be contributing to his recurrent hospitalizations and decline.  When I spoke with his daughter Ricardo Watkins on the phone she explained that Ricardo Watkins's PCP has done a great job of preparing the family for his decline.  They understand he does not have a long time left.   I attempted to elicit values and goals of care important to the patient.  His wife (who is deaf but reads lips) steps in and points to his DNR bracelet.  We have already covered this.  I asked Ricardo Watkins if he were to get worse - when would he want to continue more aggressive interventions and when would he want to be kept comfortable at home?  He did not give me a direct answer, so I asked him just to think about it.  Hospice and Palliative Care services outpatient were explained  and offered.  Ricardo Watkins is not hospice eligible yet as he is walking, talking, and still wishes to return to the hospital.  When I spoke on the phone with Ricardo Watkins she let me know that it is important to the patient and his family that he not go to a SNF.  We discussed hospice at home when the time comes.  Ricardo Watkins was open to those services.  Questions and concerns were addressed.  Hard Choices booklet left for review. The family was encouraged to call with questions or concerns.    Primary Decision Maker:  PATIENT.  His surrogate is his wife, but she is well supported by her children particularly Ricardo Watkins.    SUMMARY OF RECOMMENDATIONS    Patient is DNR.  Family has a good understanding that is prognosis is no good.  Please offer the patient Palliative Care to follow on discharge.  I worry that his decline will be rapid.  Code Status/Advance Care Planning:  DNR   Symptom Management:   Eye drops for dry  eyes.  Additional Recommendations (Limitations, Scope, Preferences):  No Surgical Procedures  Psycho-social/Spiritual:   Desire for further Chaplaincy support: yes  Prognosis:  Likely less than 6 months given rapid decline - recurrent hospitalizations, increased need for transfusions, recurrent infections.    Discharge Planning: Home with Home Health and Palliative      Primary Diagnoses: Present on Admission: . HCAP (healthcare-associated pneumonia) . CAD (coronary artery disease) . Essential hypertension . Chronic renal failure, stage 3 (moderate) (HCC) . MDS (myelodysplastic syndrome) with 5q deletion ([place]) . Sepsis ([place])   I have reviewed the medical record, interviewed the patient and family, and examined the patient. The following aspects are pertinent.  Past Medical History:  Diagnosis Date  . Adenomatous colon polyp   . CAD (coronary artery disease)    30% LAD Stenosis, 70% ramus intermedius stenosis, treated with PTCA and angioplasty by Dr Albertine Patricia 2004  . Cataract    right eye  . CHF (congestive heart failure) ([place])   . Chronic ITP (idiopathic thrombocytopenia) (HCC)   . Cough, persistent 11/04/2015  . Diverticulosis   . DM (diabetes mellitus) ([place])   . DVT (deep venous thrombosis) ([place])    secondary to surgery  . Dyspnea   . Fever 05/01/2018  . Hyperlipidemia   . Hypertension   . Inguinal hernia    right  . Laceration of finger of right hand 05/01/2018   INDEX FINGER  . 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 Microcytic anemia 07/07/2015  . 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 Non Hodgkin's lymphoma ([place])   . Pleural effusion, left 11/04/2015  . Prostate CA ([place]) 09/10/2011   Gleason 3+3 R, 3+4 L lobe May 2007 Rx Radioactive seed implants Dr [name]  . PVD (peripheral vascular disease) ([place])    rt renal artery stent  . Renal insufficiency   . Thrombocytopenia ([place])   . Thrombotic stroke ([place]) 09/10/2011   January 18, 2011 infarct genu & post limb R internal capsule - acute; previous lacunar infarcts/extensive white matter dis  . Thyroid nodule    left lower lobe (annual monitoring).  . Vitamin D deficiency    Social History   Socioeconomic History  . Marital status: Married    Spouse name: Not on file  . Number of children: 4  . Years of education: Not on file  . Highest education level: Not on file  Occupational History  . Occupation: retired  Scientific laboratory technician  . Financial resource strain: Not on file  . Food insecurity:    Worry: Not on file    Inability: Not on file  . Transportation needs:    Medical: Not on file    Non-medical: Not on file  Tobacco Use  . Smoking status: Former Smoker    Packs/day: 0.50    Years: 20.00    Pack years: 10.00    Types: Pipe, Cigarettes    Last attempt to quit: 06/21/1958    Years since quitting: 59.9  . Smokeless tobacco: Never Used  Substance and Sexual Activity  . Alcohol use: No    Alcohol/week: 0.0 standard drinks  . Drug use: No  . Sexual activity: Yes    Partners: Female  Lifestyle  . Physical activity:    Days per week: Not on file    Minutes per session: Not on file  . Stress: Not on file  Relationships  . Social connections:    Talks on phone: Not on file    Gets together: Not on file    Attends religious service: Not on file    Active member of club or organization: Not on file    Attends meetings of clubs or organizations: Not on file    Relationship status: Not on file  Other Topics Concern  . Not on file  Social History Narrative  . Not on file   Family History  Problem Relation Age of Onset  . Lymphoma Sister   . Prostate cancer Brother   . Breast cancer Sister   . Diabetes Brother   . Diabetes Sister   . Heart disease Brother   . Asthma Son    Scheduled Meds: . sodium chloride   Intravenous Once  . aspirin EC  81 mg Oral Daily  . clopidogrel  75 mg Oral Daily  . gabapentin  100 mg Oral QHS  . insulin aspart   0-5 Units Subcutaneous QHS  . insulin aspart  0-9 Units Subcutaneous TID WC  . insulin glargine  20 Units Subcutaneous q morning - 10a  . isosorbide mononitrate  60 mg Oral Daily  . metoprolol succinate  100 mg Oral Daily  . multivitamin with minerals  1 tablet Oral q morning - 10a  . naphazoline-pheniramine  1 drop Both Eyes Daily  . polyethylene glycol  17 g Oral Daily  . pravastatin  40 mg Oral q1800  . senna-docusate  1 tablet Oral BID  . tamsulosin  0.4 mg Oral Daily   Continuous Infusions: . ampicillin-sulbactam (UNASYN) IV 3 g (05/30/18 0319)   PRN Meds:.acetaminophen, loperamide, meclizine, neomycin-bacitracin-polymyxin, nitroGLYCERIN, traMADol Allergies  Allergen Reactions  . Glucophage [Metformin Hydrochloride] Other (See Comments)    Chest pain  . Zetia [Ezetimibe] Other (See Comments)    weakness  . Fenofibrate Rash  . Niacin-Lovastatin Er Rash   Review of Systems dry eyes, occ chest pain, wants to be up in the chair,   Physical Exam  Elderly gentleman with frequent blinking.  Cries intermittently CV irreg irreg Resp slightly increased work of breathing. Abdomen soft, nt, nd, + bs  Vital Signs: BP 139/64 (BP Location: Left Arm)   Watkins (!) 103   Temp 98.6 F (37 C) (Oral)   Resp 20   Ht 5' 9"  (1.753 m)   Wt 83.4 kg   SpO2 97%   BMI 27.15 kg/m  Pain Scale: 0-10   Pain Score: 1  SpO2: SpO2: 97 % O2 Device:SpO2: 97 % O2 Flow Rate: .O2 Flow Rate (L/min): 2 L/min  IO: Intake/output summary:   Intake/Output Summary (Last 24 hours) at 05/30/2018 1050 Last data filed at 05/30/2018 0400 Gross per 24 hour  Intake 415 ml  Output 700 ml  Net -285 ml    LBM: Last BM Date: 05/26/18 Baseline Weight: Weight: 83.4 kg Most recent weight: Weight: 83.4 kg     Palliative Assessment/Data: 50%   Flowsheet Rows     Most Recent Value  Intake Tab  Referral Department  Hospitalist  Unit at Time of Referral  ER  Palliative Care Primary Diagnosis  Cardiac    Date Notified  05/28/18  Palliative Care Type  New Palliative care  Reason for referral  End of Life Care Assistance  Date of Admission  05/28/18  # of days IP prior to Palliative referral  0  Clinical Assessment  Psychosocial & Spiritual Assessment  Palliative Care Outcomes      Time In: 10:00 Time Out: 11:00 Time Total: 60 min. Greater than 50%  of this time was spent counseling and coordinating care related to the above assessment and plan.  Signed by: Florentina Jenny, PA-C Palliative Medicine Pager: [phone]  Please contact Palliative Medicine Team phone at [phone] for questions and concerns.  For individual provider: See Shea Evans

## 2018-05-31 ENCOUNTER — Inpatient Hospital Stay (HOSPITAL_COMMUNITY): Payer: Medicare Other

## 2018-05-31 DIAGNOSIS — L988 Other specified disorders of the skin and subcutaneous tissue: Secondary | ICD-10-CM

## 2018-05-31 DIAGNOSIS — R109 Unspecified abdominal pain: Secondary | ICD-10-CM

## 2018-05-31 LAB — CULTURE, BLOOD (ROUTINE X 2): Special Requests: ADEQUATE

## 2018-05-31 LAB — CBC WITH DIFFERENTIAL/PLATELET
Abs Immature Granulocytes: 0.16 10*3/uL — ABNORMAL HIGH (ref 0.00–0.07)
Basophils Absolute: 0 10*3/uL (ref 0.0–0.1)
Basophils Relative: 0 %
Eosinophils Absolute: 0 10*3/uL (ref 0.0–0.5)
Eosinophils Relative: 0 %
HEMATOCRIT: 28.1 % — AB (ref 39.0–52.0)
Hemoglobin: 9 g/dL — ABNORMAL LOW (ref 13.0–17.0)
IMMATURE GRANULOCYTES: 2 %
LYMPHS PCT: 23 %
Lymphs Abs: 1.9 10*3/uL (ref 0.7–4.0)
MCH: 25.7 pg — ABNORMAL LOW (ref 26.0–34.0)
MCHC: 32 g/dL (ref 30.0–36.0)
MCV: 80.3 fL (ref 80.0–100.0)
MONOS PCT: 19 %
Monocytes Absolute: 1.6 10*3/uL — ABNORMAL HIGH (ref 0.1–1.0)
Neutro Abs: 4.9 10*3/uL (ref 1.7–7.7)
Neutrophils Relative %: 56 %
Platelets: 115 10*3/uL — ABNORMAL LOW (ref 150–400)
RBC: 3.5 MIL/uL — ABNORMAL LOW (ref 4.22–5.81)
RDW: 20.1 % — ABNORMAL HIGH (ref 11.5–15.5)
WBC: 8.6 10*3/uL (ref 4.0–10.5)
nRBC: 0 % (ref 0.0–0.2)

## 2018-05-31 LAB — COMPREHENSIVE METABOLIC PANEL
ALK PHOS: 55 U/L (ref 38–126)
ALT: 19 U/L (ref 0–44)
AST: 20 U/L (ref 15–41)
Albumin: 2.3 g/dL — ABNORMAL LOW (ref 3.5–5.0)
Anion gap: 13 (ref 5–15)
BUN: 38 mg/dL — ABNORMAL HIGH (ref 8–23)
CO2: 20 mmol/L — AB (ref 22–32)
Calcium: 8.4 mg/dL — ABNORMAL LOW (ref 8.9–10.3)
Chloride: 107 mmol/L (ref 98–111)
Creatinine, Ser: 2.09 mg/dL — ABNORMAL HIGH (ref 0.61–1.24)
GFR calc Af Amer: 32 mL/min — ABNORMAL LOW (ref >60)
GFR calc non Af Amer: 28 mL/min — ABNORMAL LOW (ref >60)
Glucose, Bld: 188 mg/dL — ABNORMAL HIGH (ref 70–99)
Potassium: 4 mmol/L (ref 3.5–5.1)
Sodium: 140 mmol/L (ref 135–145)
Total Bilirubin: 1 mg/dL (ref 0.3–1.2)
Total Protein: 7.3 g/dL (ref 6.5–8.1)

## 2018-05-31 LAB — GLUCOSE, CAPILLARY
Glucose-Capillary: 158 mg/dL — ABNORMAL HIGH (ref 70–99)
Glucose-Capillary: 150 mg/dL — ABNORMAL HIGH (ref 70–99)
Glucose-Capillary: 182 mg/dL — ABNORMAL HIGH (ref 70–99)
Glucose-Capillary: 233 mg/dL — ABNORMAL HIGH (ref 70–99)
Glucose-Capillary: 199 mg/dL — ABNORMAL HIGH (ref 70–99)

## 2018-05-31 LAB — MAGNESIUM: Magnesium: 2 mg/dL (ref 1.7–2.4)

## 2018-05-31 LAB — PATHOLOGIST SMEAR REVIEW

## 2018-05-31 LAB — CK: Total CK: 22 U/L — ABNORMAL LOW (ref 49–397)

## 2018-05-31 MED ORDER — SODIUM CHLORIDE 0.9 % IV SOLN
3.0000 g | Freq: Two times a day (BID) | INTRAVENOUS | Status: DC
  Administered 2018-05-31 – 2018-06-06 (×13): 3 g via INTRAVENOUS
  Filled 2018-05-31 (×14): qty 3

## 2018-05-31 MED ORDER — INSULIN ASPART 100 UNIT/ML ~~LOC~~ SOLN
0.0000 [IU] | Freq: Three times a day (TID) | SUBCUTANEOUS | Status: DC
  Administered 2018-06-01: 2 [IU] via SUBCUTANEOUS
  Administered 2018-06-01: 3 [IU] via SUBCUTANEOUS
  Administered 2018-06-01: 2 [IU] via SUBCUTANEOUS

## 2018-05-31 NOTE — Care Management Note (Addendum)
Case Management Note  Patient Details  Name: Ricardo Watkins MRN: [id number] Date of Birth: Mar 03, 1931  Subjective/Objective:  NCM spoke with wife and grandson at bedside, offered choice, they chose [facility] for [facility], and HHPT.  Referral given to [facility] with [facility] .  Soc will begin 24-48hrs post dc.   Patient will need outpatient palliative services, wife chose [facility], referral given to [facility] with [facility].  They will contact wife in patient room to discuss palliative services.                Action/Plan: NCM will follow for transition of care needs. Will need HH orders with face to face.   Expected Discharge Date:                  Expected Discharge Plan:  [place]  In-House Referral:     Discharge planning Services  CM Consult  Post Acute Care Choice:  Home Health Choice offered to:  Spouse, Adult Children  DME Arranged:    DME Agency:     HH Arranged:  PT,RN [place]  Status of Service:  In process, will continue to follow  If discussed at Long Length of Stay Meetings, dates discussed:    Additional Comments:  Zenon Mayo, RN 05/31/2018, 4:16 PM

## 2018-05-31 NOTE — Progress Notes (Signed)
Pharmacy Antibiotic Note  Ricardo Watkins is a 84 y.o. male admitted on 05/28/2018 with enterococcus faecium bacteremia. Thankfully isolate is susceptible to ampicillin so will switch back to Unasyn today.    Pharmacy has been consulted for Unasyn  dosing. WBC WNL and Tmax- 103.5. Repeat blood cultures from 12/9 are NGTD. Plan CT abdomen today to evaluate for source of bacteremia.   Plan: Unasyn 3 gm every 12 hours Monitor renal function, CT, repeat cultures    Height: 5\' 9"  (175.3 cm) Weight: 183 lb 13.8 oz (83.4 kg) IBW/kg (Calculated) : 70.7  Temp (24hrs), Avg:100.8 F (38.2 C), Min:98.9 F (37.2 C), Max:103.5 F (39.7 C)  Recent Labs  Lab 05/28/18 0839 05/28/18 0849 05/28/18 1038 05/29/18 0539 05/29/18 0949 05/29/18 1715 05/30/18 0645 05/30/18 0831 05/31/18 0306  WBC 4.8  --   --  3.8*  --  4.8 3.9* 4.7  --   CREATININE 2.11*  --   --  2.40*  --   --  2.28*  --  2.09*  LATICACIDVEN  --  1.43 0.52  --   --   --   --   --   --   VANCOTROUGH  --   --   --   --  10*  --   --   --   --     Estimated Creatinine Clearance: 25.8 mL/min (A) (by C-G formula based on SCr of 2.09 mg/dL (H)).    Allergies  Allergen Reactions  . Glucophage [Metformin Hydrochloride] Other (See Comments)    Chest pain  . Zetia [Ezetimibe] Other (See Comments)    weakness  . Fenofibrate Rash  . Niacin-Lovastatin Er Rash    Antimicrobials this admission: Unasyn 12/9>>12/10, 12/11>> Daptomycin 12/11>>12/10 Vanc 12/8>>12/9 Cefepime 12/8>> 12/9    Microbiology results: 12/8 BCx: Enterococcus faecium (S- amp, vanc)   Thank you for allowing pharmacy to be a part of this patient's care.  Jimmy Footman, PharmD, BCPS, BCIDP Infectious Diseases Clinical Pharmacist Phone: [phone] 05/31/2018 9:03 AM

## 2018-05-31 NOTE — Progress Notes (Signed)
OT Cancellation Note  Patient Details Name: Ricardo Watkins MRN: [id number] DOB: [birth year]   Cancelled Treatment:    Reason Eval/Treat Not Completed: Other (comment);Patient at procedure or test/ unavailable. Attempted x 2 to see pt. Pt with N/V on first attempt, then off unit for testing second attempt. Will try back next available appropriate day/time  [name] 05/31/2018, 12:23 PM

## 2018-05-31 NOTE — Progress Notes (Signed)
[facility] Progress Note  Patient: Ricardo Watkins PFX:[id number]   PCP: [name], MD DOB: 03/28/1931   DOA: 05/28/2018   DOS: 05/31/2018   Date of Service: the patient was seen and examined on 05/31/2018  Brief hospital course: Pt. with PMH of PVD s/p R renal artery stent; remote prostate CA s/p seed radiation; NHL; HTN; HLD; DM; chronic ITP; CHF; and CAD s/p stent(recentlydeemed not an interventional candidate)h/o 4 admission in 12 months and 13 ED visits in 12 months; admitted on 05/28/2018, presented with complaint of chills and chest pain, was found to have pneumonia and enterococcus bacteremia.  S/P 2 PRBC on 05/29/2018 -ID following  Subjective:   -Febrile throughout the night, temperature ranging from 103.6-101.6 -Continues to have an intermittent cough  Assessment and Plan: 1.  Sepsis/enterococcal bacteremia -Presumed to be secondary to healthcare associated pneumonia -Decreased breath sounds at the right base, will repeat chest x-ray -Blood cultures speciated at enterococcus faecium -Antibiotics transitioned back to Unasyn per infectious disease -Appreciate ID input -Blood cultures from 12/10 negative thus far -Agree with CT abdomen pelvis due to mild abdominal discomfort today  2.  Myelodysplastic syndrome with 5 q. Deletion. -With symptomatic anemia, chronic neutropenia and thrombocytopenia -He was given 2 units of PRBC for hemoglobin of 6.9 on 12/9 -He is also on Nplate and erythropoietin -Followed by oncology [name] -Monitor  3.  CAD/chronic systolic and diastolic CHF -Echocardiogram in 9/2 0 19 showed EF of 30 to 32% with diastolic dysfunction -Repeat echo now showed improvement in EF to 50%, no overt vegetations -Lasix on hold due to worsening AKI -Continue aspirin, Plavix, Toprol, Imdur  4.  Acute kidney injury on chronic kidney disease stage IV. Baseline renal function around 1.8-1.7 -Min was 2.4 on admission, trended down to  2.0 -Diuretics on hold in the setting of sepsis and high fevers  5. Type 2 Diabetes Mellitus, uncontroled with renal complication and neuropathic complication -Lantus and sliding scale -Hb A1c is 7.7  6.  Goals of care discussion. -Multiple frequent hospitalizations -Seen by palliative care for goals of care discussion, Patient is DNR/DNI for now.  Diet: regular diet thin DVT Prophylaxis: mechanical compression device  Advance goals of care discussion: DNR DNI  Family Communication: Wife and son at bedside  Disposition: Discharge to be determined.  Consultants: ID, Palliative care  Procedures: Echocardiogram   Scheduled Meds: . sodium chloride   Intravenous Once  . aspirin EC  81 mg Oral Daily  . clopidogrel  75 mg Oral Daily  . gabapentin  100 mg Oral QHS  . insulin aspart  0-5 Units Subcutaneous QHS  . insulin aspart  0-9 Units Subcutaneous TID WC  . insulin glargine  20 Units Subcutaneous q morning - 10a  . isosorbide mononitrate  60 mg Oral Daily  . metoprolol succinate  100 mg Oral Daily  . multivitamin with minerals  1 tablet Oral q morning - 10a  . naphazoline-pheniramine  1 drop Both Eyes Daily  . polyethylene glycol  17 g Oral Daily  . pravastatin  40 mg Oral q1800  . senna-docusate  1 tablet Oral BID  . tamsulosin  0.4 mg Oral Daily   Continuous Infusions: . ampicillin-sulbactam (UNASYN) IV 3 g (05/31/18 1032)   PRN Meds: acetaminophen, loperamide, meclizine, neomycin-bacitracin-polymyxin, nitroGLYCERIN, traMADol Antibiotics: Anti-infectives (From admission, onward)   Start     Dose/Rate Route Frequency Ordered Stop   05/31/18 1000  Ampicillin-Sulbactam (UNASYN) 3 g in sodium chloride 0.9 % 100 mL  IVPB     3 g 200 mL/hr over 30 Minutes Intravenous Every 12 hours 05/31/18 0841     05/30/18 1500  DAPTOmycin (CUBICIN) 670 mg in sodium chloride 0.9 % IVPB  Status:  Discontinued     670 mg 226.8 mL/hr over 30 Minutes Intravenous Every 48 hours 05/30/18 1327  05/31/18 0835   05/29/18 1400  Ampicillin-Sulbactam (UNASYN) 3 g in sodium chloride 0.9 % 100 mL IVPB  Status:  Discontinued     3 g 200 mL/hr over 30 Minutes Intravenous Every 12 hours 05/29/18 1059 05/30/18 1326   05/29/18 1000  vancomycin (VANCOCIN) IVPB 750 mg/150 ml premix  Status:  Discontinued     750 mg 150 mL/hr over 60 Minutes Intravenous Every 24 hours 05/28/18 1004 05/29/18 1052   05/28/18 2200  ceFEPIme (MAXIPIME) 1 g in sodium chloride 0.9 % 100 mL IVPB  Status:  Discontinued     1 g 200 mL/hr over 30 Minutes Intravenous Every 12 hours 05/28/18 1004 05/29/18 1052   05/28/18 0845  ceFEPIme (MAXIPIME) 2 g in sodium chloride 0.9 % 100 mL IVPB     2 g 200 mL/hr over 30 Minutes Intravenous  Once 05/28/18 0834 05/28/18 0931   05/28/18 0845  vancomycin (VANCOCIN) IVPB 1000 mg/200 mL premix  Status:  Discontinued     1,000 mg 200 mL/hr over 60 Minutes Intravenous  Once 05/28/18 0834 05/28/18 0839   05/28/18 0845  vancomycin (VANCOCIN) 1,500 mg in sodium chloride 0.9 % 500 mL IVPB     1,500 mg 250 mL/hr over 120 Minutes Intravenous  Once 05/28/18 0839 05/28/18 1156       Objective: Physical Exam: Vitals:   05/31/18 0420 05/31/18 0600 05/31/18 0805 05/31/18 0955  BP:   (!) 158/70 (!) 160/72  Pulse:   (!) 122 (!) 115  Resp:      Temp: (!) 101.6 F (38.7 C) (!) 101.4 F (38.6 C) 98.9 F (37.2 C)   TempSrc: Axillary Axillary    SpO2:   (!) 88%   Weight:      Height:        Intake/Output Summary (Last 24 hours) at 05/31/2018 1322 Last data filed at 05/31/2018 1039 Gross per 24 hour  Intake 800 ml  Output 901 ml  Net -101 ml   Filed Weights   05/28/18 0826  Weight: 83.4 kg   Gen: Awake, Alert, Oriented X 3, early, chronically ill-appearing male, uncomfortable HEENT: PERRLA, Neck supple, no JVD Lungs: Decreased breath sounds at the right base CVS: S1-S2/systolic ejection murmur Abd: soft, Non tender, non distended, BS present Extremities: No edema Skin: no new  rashes  Data Reviewed: CBC: Recent Labs  Lab 05/24/18 1425 05/28/18 0839 05/29/18 0539 05/29/18 1715 05/30/18 0645 05/30/18 0831 05/31/18 0735  WBC 2.7* 4.8 3.8* 4.8 3.9* 4.7 8.6  NEUTROABS 0.5* 1.4* 1.2*  --   --  2.0 4.9  HGB 7.3* 7.7* 6.5* 6.8* 8.7* 9.0* 9.0*  HCT 24.7* 27.0* 22.0* 22.4* 29.2* 28.9* 28.1*  MCV 86.4 85.2 84.3 83.9 85.9 84.5 80.3  PLT 126* 144* PLATELET CLUMPS NOTED ON SMEAR, UNABLE TO ESTIMATE 126* 105* 112* 245*   Basic Metabolic Panel: Recent Labs  Lab 05/28/18 0839 05/29/18 0539 05/30/18 0645 05/31/18 0306  NA 141 142 143 140  K 3.9 3.9 3.9 4.0  CL 106 108 106 107  CO2 24 24 26  20*  GLUCOSE 169* 179* 161* 188*  BUN 38* 39* 40* 38*  CREATININE 2.11* 2.40* 2.28* 2.09*  CALCIUM 8.7* 8.2* 8.6* 8.4*  MG  --   --   --  2.0    Liver Function Tests: Recent Labs  Lab 05/28/18 0839 05/31/18 0306  AST 15 20  ALT 16 19  ALKPHOS 71 55  BILITOT 0.7 1.0  PROT 7.9 7.3  ALBUMIN 2.9* 2.3*   No results for input(s): LIPASE, AMYLASE in the last 168 hours. No results for input(s): AMMONIA in the last 168 hours. Coagulation Profile: No results for input(s): INR, PROTIME in the last 168 hours. Cardiac Enzymes: Recent Labs  Lab 05/31/18 0306  CKTOTAL 22*   BNP (last 3 results) No results for input(s): PROBNP in the last 8760 hours. CBG: Recent Labs  Lab 05/30/18 1109 05/30/18 1708 05/30/18 1933 05/31/18 0803 05/31/18 1312  GLUCAP 201* 189* 252* 158* 150*   Studies: Ct Abdomen Pelvis Wo Contrast  Result Date: 05/31/2018 CLINICAL DATA:  Bacteremia. EXAM: CT ABDOMEN AND PELVIS WITHOUT CONTRAST TECHNIQUE: Multidetector CT imaging of the abdomen and pelvis was performed following the standard protocol without IV contrast. COMPARISON:  CT scan of April 14, 2018. FINDINGS: Lower chest: Mild right pleural effusion is noted with adjacent subsegmental atelectasis. Hepatobiliary: No focal liver abnormality is seen. No gallstones, gallbladder wall  thickening, or biliary dilatation. Pancreas: Unremarkable. No pancreatic ductal dilatation or surrounding inflammatory changes. Spleen: Normal in size without focal abnormality. Adrenals/Urinary Tract: Adrenal glands appear normal. Stable exophytic right renal cyst is noted. No hydronephrosis or renal obstruction is noted. No renal or ureteral calculi are noted. Urinary bladder is unremarkable. Stomach/Bowel: Stomach is within normal limits. Appendix appears normal. No evidence of bowel wall thickening, distention, or inflammatory changes. Sigmoid diverticulosis is noted without inflammation. Vascular/Lymphatic: Aortic atherosclerosis. No enlarged abdominal or pelvic lymph nodes. Reproductive: Status post prostatic brachytherapy seed placement. Other: Large fat containing right inguinal hernia is noted which extends into the scrotum. This is stable compared to prior exam. Musculoskeletal: No acute or significant osseous findings. IMPRESSION: Mild right pleural effusion is noted with adjacent subsegmental atelectasis. Sigmoid diverticulosis without inflammation. Status post prostatic brachytherapy seed placement. Stable large fat containing right inguinal hernia is noted which extends into the scrotum. Aortic Atherosclerosis (ICD10-I70.0). Electronically Signed   By: Marijo Conception, M.D.   On: 05/31/2018 12:50   Dg Chest 2 View  Result Date: 05/31/2018 CLINICAL DATA:  New onset of fever. The patient has an upset stomach and is coughing. History of CHF, previous CVA, previous episodes of pneumonia, former smoker. EXAM: CHEST - 2 VIEW COMPARISON:  Portable chest x-ray of May 28, 2018 FINDINGS: There's been interval worsening in airspace opacity in the right upper lobe. The right lower lung is clear. The left lung is clear. The heart is top-normal in size. The pulmonary vascularity is not engorged. There is calcification in the wall of the aortic arch. IMPRESSION: Worsening airspace opacity in the right upper  lobe compatible with pneumonia. No overt CHF. Thoracic aortic atherosclerosis. Electronically Signed   By: David  Martinique M.D.   On: 05/31/2018 12:09     Time spent: 35 minutes  Domenic Polite , MD [facility] Page via [email or web address] 05/31/2018 1:22 PM  Between 7PM-7AM, please contact night-coverage at [email or web address], password [facility]

## 2018-05-31 NOTE — Care Management Important Message (Signed)
Important Message  Patient Details  Name: Ricardo Watkins MRN: [id number] Date of Birth: 03-18-1931   Medicare Important Message Given:  Yes    Emiliya Chretien [address] Circle 05/31/2018, 2:19 PM

## 2018-05-31 NOTE — Progress Notes (Signed)
Subjective:  He is complaining of abdominal pain today   Antibiotics:  Anti-infectives (From admission, onward)   Start     Dose/Rate Route Frequency Ordered Stop   05/31/18 1000  Ampicillin-Sulbactam (UNASYN) 3 g in sodium chloride 0.9 % 100 mL IVPB     3 g 200 mL/hr over 30 Minutes Intravenous Every 12 hours 05/31/18 0841     05/30/18 1500  DAPTOmycin (CUBICIN) 670 mg in sodium chloride 0.9 % IVPB  Status:  Discontinued     670 mg 226.8 mL/hr over 30 Minutes Intravenous Every 48 hours 05/30/18 1327 05/31/18 0835   05/29/18 1400  Ampicillin-Sulbactam (UNASYN) 3 g in sodium chloride 0.9 % 100 mL IVPB  Status:  Discontinued     3 g 200 mL/hr over 30 Minutes Intravenous Every 12 hours 05/29/18 1059 05/30/18 1326   05/29/18 1000  vancomycin (VANCOCIN) IVPB 750 mg/150 ml premix  Status:  Discontinued     750 mg 150 mL/hr over 60 Minutes Intravenous Every 24 hours 05/28/18 1004 05/29/18 1052   05/28/18 2200  ceFEPIme (MAXIPIME) 1 g in sodium chloride 0.9 % 100 mL IVPB  Status:  Discontinued     1 g 200 mL/hr over 30 Minutes Intravenous Every 12 hours 05/28/18 1004 05/29/18 1052   05/28/18 0845  ceFEPIme (MAXIPIME) 2 g in sodium chloride 0.9 % 100 mL IVPB     2 g 200 mL/hr over 30 Minutes Intravenous  Once 05/28/18 0834 05/28/18 0931   05/28/18 0845  vancomycin (VANCOCIN) IVPB 1000 mg/200 mL premix  Status:  Discontinued     1,000 mg 200 mL/hr over 60 Minutes Intravenous  Once 05/28/18 0834 05/28/18 0839   05/28/18 0845  vancomycin (VANCOCIN) 1,500 mg in sodium chloride 0.9 % 500 mL IVPB     1,500 mg 250 mL/hr over 120 Minutes Intravenous  Once 05/28/18 0839 05/28/18 1156      Medications: Scheduled Meds: . sodium chloride   Intravenous Once  . aspirin EC  81 mg Oral Daily  . clopidogrel  75 mg Oral Daily  . gabapentin  100 mg Oral QHS  . insulin aspart  0-5 Units Subcutaneous QHS  . insulin aspart  0-9 Units Subcutaneous TID WC  . insulin glargine  20 Units  Subcutaneous q morning - 10a  . isosorbide mononitrate  60 mg Oral Daily  . metoprolol succinate  100 mg Oral Daily  . multivitamin with minerals  1 tablet Oral q morning - 10a  . naphazoline-pheniramine  1 drop Both Eyes Daily  . polyethylene glycol  17 g Oral Daily  . pravastatin  40 mg Oral q1800  . senna-docusate  1 tablet Oral BID  . tamsulosin  0.4 mg Oral Daily   Continuous Infusions: . ampicillin-sulbactam (UNASYN) IV     PRN Meds:.acetaminophen, loperamide, meclizine, neomycin-bacitracin-polymyxin, nitroGLYCERIN, traMADol    Objective: Weight change:   Intake/Output Summary (Last 24 hours) at 05/31/2018 1010 Last data filed at 05/31/2018 0310 Gross per 24 hour  Intake 800 ml  Output 900 ml  Net -100 ml   Blood pressure (!) 160/72, pulse (!) 115, temperature 98.9 F (37.2 C), resp. rate 15, height 5\' 9"  (1.753 m), weight 83.4 kg, SpO2 (!) 88 %. Temp:  [98.9 F (37.2 C)-103.5 F (39.7 C)] 98.9 F (37.2 C) (12/11 0805) Pulse Rate:  [88-122] 115 (12/11 0955) Resp:  [15] 15 (12/10 2327) BP: (119-160)/(54-72) 160/72 (12/11 0955) SpO2:  [88 %-99 %] 88 % (12/11  [email or web address])  Physical Exam: General: Alert and awake, oriented x3, not in any acute distress. HEENT: anicteric sclera, EOMI CVS regular rate, normal  Chest: , no wheezing, no respiratory distress Abdomen: soft non-distended, but diffusely tender to palpation no rebound Skin:   Lesion on finger 05/30/2018:    Feet are clear of any lesions     Neuro: nonfocal  CBC:    BMET Recent Labs    05/30/18 0645 05/31/18 0306  NA 143 140  K 3.9 4.0  CL 106 107  CO2 26 20*  GLUCOSE 161* 188*  BUN 40* 38*  CREATININE 2.28* 2.09*  CALCIUM 8.6* 8.4*     Liver Panel  Recent Labs    05/31/18 0306  PROT 7.3  ALBUMIN 2.3*  AST 20  ALT 19  ALKPHOS 55  BILITOT 1.0       Sedimentation Rate No results for input(s): ESRSEDRATE in the last 72 hours. C-Reactive Protein No results for input(s): CRP  in the last 72 hours.  Micro Results: Recent Results (from the past 720 hour(s))  Culture, blood (routine x 2)     Status: None   Collection Time: 05/01/18  4:56 PM  Result Value Ref Range Status   Specimen Description BLOOD LEFT ANTECUBITAL  Final   Special Requests   Final    BOTTLES DRAWN AEROBIC ONLY Blood Culture adequate volume   Culture   Final    NO GROWTH 5 DAYS Performed at [facility], [address] [place], [place] [address]    Report Status 05/06/2018 FINAL  Final  Culture, blood (routine x 2)     Status: None   Collection Time: 05/01/18  4:56 PM  Result Value Ref Range Status   Specimen Description BLOOD RIGHT ANTECUBITAL  Final   Special Requests   Final    BOTTLES DRAWN AEROBIC ONLY Blood Culture adequate volume   Culture   Final    NO GROWTH 5 DAYS Performed at [facility], [place] [address], [place], [place] [address]    Report Status 05/06/2018 FINAL  Final  Blood Culture (routine x 2)     Status: None (Preliminary result)   Collection Time: 05/28/18  8:34 AM  Result Value Ref Range Status   Specimen Description BLOOD BLOOD RIGHT FOREARM  Final   Special Requests   Final    BOTTLES DRAWN AEROBIC AND ANAEROBIC Blood Culture adequate volume   Culture   Final    NO GROWTH 2 DAYS Performed at [facility], [place] [address], [place], [place] [address]    Report Status PENDING  Incomplete  Urine culture     Status: Abnormal   Collection Time: 05/28/18  8:34 AM  Result Value Ref Range Status   Specimen Description URINE, RANDOM  Final   Special Requests NONE  Final   Culture (A)  Final    <10,000 COLONIES/mL INSIGNIFICANT GROWTH Performed at [place] [address], [place],  [address]    Report Status 05/29/2018 FINAL  Final  Blood Culture (routine x 2)     Status: Abnormal   Collection Time: 05/28/18  8:39 AM  Result Value Ref Range Status   Specimen Description BLOOD RIGHT ANTECUBITAL  Final   Special Requests    Final    BOTTLES DRAWN AEROBIC AND ANAEROBIC Blood Culture adequate volume   Culture  Setup Time   Final    GRAM POSITIVE COCCI AEROBIC BOTTLE ONLY CRITICAL RESULT CALLED TO,  READ BACK BY AND VERIFIED WITH: Salli Real [id number] [phone] Performed at [facility], [place] [address], [place], [place] [address]    Culture ENTEROCOCCUS FAECIUM (A)  Final   Report Status 05/31/2018 FINAL  Final   Organism ID, Bacteria ENTEROCOCCUS FAECIUM  Final      Susceptibility   Enterococcus faecium - MIC*    AMPICILLIN <=2 SENSITIVE Sensitive     VANCOMYCIN 1 SENSITIVE Sensitive     GENTAMICIN SYNERGY SENSITIVE Sensitive     * ENTEROCOCCUS FAECIUM  Blood Culture ID Panel (Reflexed)     Status: Abnormal   Collection Time: 05/28/18  8:39 AM  Result Value Ref Range Status   Enterococcus species DETECTED (A) NOT DETECTED Final    Comment: CRITICAL RESULT CALLED TO, READ BACK BY AND VERIFIED WITH: PHARMD G ABBOTT [phone] MLM    Vancomycin resistance NOT DETECTED NOT DETECTED Final   Listeria monocytogenes NOT DETECTED NOT DETECTED Final   Staphylococcus species NOT DETECTED NOT DETECTED Final   Staphylococcus aureus (BCID) NOT DETECTED NOT DETECTED Final   Streptococcus species NOT DETECTED NOT DETECTED Final   Streptococcus agalactiae NOT DETECTED NOT DETECTED Final   Streptococcus pneumoniae NOT DETECTED NOT DETECTED Final   Streptococcus pyogenes NOT DETECTED NOT DETECTED Final   Acinetobacter baumannii NOT DETECTED NOT DETECTED Final   Enterobacteriaceae species NOT DETECTED NOT DETECTED Final   Enterobacter cloacae complex NOT DETECTED NOT DETECTED Final   Escherichia coli NOT DETECTED NOT DETECTED Final   Klebsiella oxytoca NOT DETECTED NOT DETECTED Final   Klebsiella pneumoniae NOT DETECTED NOT DETECTED Final   Proteus species NOT DETECTED NOT DETECTED Final   Serratia marcescens NOT DETECTED NOT DETECTED Final   Haemophilus influenzae NOT DETECTED NOT DETECTED Final   Neisseria  meningitidis NOT DETECTED NOT DETECTED Final   Pseudomonas aeruginosa NOT DETECTED NOT DETECTED Final   Candida albicans NOT DETECTED NOT DETECTED Final   Candida glabrata NOT DETECTED NOT DETECTED Final   Candida krusei NOT DETECTED NOT DETECTED Final   Candida parapsilosis NOT DETECTED NOT DETECTED Final   Candida tropicalis NOT DETECTED NOT DETECTED Final    Comment: Performed at [facility], [place], [place], [place] [address]  Respiratory Panel by PCR     Status: None   Collection Time: 05/28/18 12:38 PM  Result Value Ref Range Status   Adenovirus NOT DETECTED NOT DETECTED Final   Coronavirus 229E NOT DETECTED NOT DETECTED Final   Coronavirus HKU1 NOT DETECTED NOT DETECTED Final   Coronavirus NL63 NOT DETECTED NOT DETECTED Final   Coronavirus OC43 NOT DETECTED NOT DETECTED Final   Metapneumovirus NOT DETECTED NOT DETECTED Final   Rhinovirus / Enterovirus NOT DETECTED NOT DETECTED Final   Influenza A NOT DETECTED NOT DETECTED Final   Influenza B NOT DETECTED NOT DETECTED Final   Parainfluenza Virus 1 NOT DETECTED NOT DETECTED Final   Parainfluenza Virus 2 NOT DETECTED NOT DETECTED Final   Parainfluenza Virus 3 NOT DETECTED NOT DETECTED Final   Parainfluenza Virus 4 NOT DETECTED NOT DETECTED Final   Respiratory Syncytial Virus NOT DETECTED NOT DETECTED Final   Bordetella pertussis NOT DETECTED NOT DETECTED Final   Chlamydophila pneumoniae NOT DETECTED NOT DETECTED Final   Mycoplasma pneumoniae NOT DETECTED NOT DETECTED Final  Culture, blood (Routine X 2) w Reflex to ID Panel     Status: None (Preliminary result)   Collection Time: 05/29/18 11:40 AM  Result Value Ref Range Status  Specimen Description BLOOD RIGHT ANTECUBITAL  Final   Special Requests   Final    BOTTLES DRAWN AEROBIC ONLY Blood Culture adequate volume   Culture   Final    NO GROWTH 1 DAY Performed at [facility], [address] [place], [place] [address]    Report Status PENDING   Incomplete    Studies/Results: No results found.    Assessment/Plan:  INTERVAL HISTORY: Worsening abdominal pain   Principal Problem:   HCAP (healthcare-associated pneumonia) Active Problems:   CAD (coronary artery disease)   Essential hypertension   Chronic renal failure, stage 3 (moderate) (HCC)   MDS (myelodysplastic syndrome) with 5q deletion (HCC)   Sepsis (HCC)   Type 2 diabetes mellitus (HCC)   CHF (congestive heart failure) (HCC)   Goals of care, counseling/discussion   Bacteremia   Palliative care encounter    Ricardo Watkins is a 84 y.o. male with  With vascular disease, myelodysplastic syndrome coronary artery disease ITP who is been admitted with multifocal pneumonia and enterococcal bacteremia.  #1 multifocal pneumonia with enterococcal bacteremia.  He also has abdominal pain and I worry about a potential intra-abdominal source for infection  I will get a CT with oral contrast today   Continue Unasyn  Follow-up blood cultures  Would not hazard a transesophageal echocardiogram     LOS: 3 days   [place] 05/31/2018, 10:10 AM

## 2018-05-31 NOTE — Progress Notes (Signed)
   05/31/18 1225  Clinical Encounter Type  Visited With Family;Patient not available  Visit Type Follow-up  Referral From Chaplain  Consult/Referral To Chaplain  The chaplain followed up with a spiritual care visit with Pt. and family.  The Pt. was out of the room at time of the visit.  The chaplain greeted the Pt. wife and son in the room.  Both family members shared their desire to have answers to what is causing the Pt.'s infection and the hope in having answers.  The Pt. wife invited the chaplain to visit again and continue to pray for the Pt.  The chaplain accepted the request for spiritual care and will F/U.

## 2018-06-01 ENCOUNTER — Inpatient Hospital Stay (HOSPITAL_COMMUNITY): Payer: Medicare Other

## 2018-06-01 ENCOUNTER — Inpatient Hospital Stay: Payer: Medicare Other

## 2018-06-01 ENCOUNTER — Inpatient Hospital Stay: Payer: Medicare Other | Admitting: Hematology and Oncology

## 2018-06-01 DIAGNOSIS — J9811 Atelectasis: Secondary | ICD-10-CM

## 2018-06-01 DIAGNOSIS — A4181 Sepsis due to Enterococcus: Secondary | ICD-10-CM

## 2018-06-01 DIAGNOSIS — K5790 Diverticulosis of intestine, part unspecified, without perforation or abscess without bleeding: Secondary | ICD-10-CM

## 2018-06-01 DIAGNOSIS — J90 Pleural effusion, not elsewhere classified: Secondary | ICD-10-CM

## 2018-06-01 DIAGNOSIS — R509 Fever, unspecified: Secondary | ICD-10-CM

## 2018-06-01 LAB — BASIC METABOLIC PANEL
Anion gap: 11 (ref 5–15)
BUN: 47 mg/dL — AB (ref 8–23)
CALCIUM: 8.4 mg/dL — AB (ref 8.9–10.3)
CHLORIDE: 105 mmol/L (ref 98–111)
CO2: 24 mmol/L (ref 22–32)
Creatinine, Ser: 2.29 mg/dL — ABNORMAL HIGH (ref 0.61–1.24)
GFR calc Af Amer: 29 mL/min — ABNORMAL LOW (ref >60)
GFR, EST NON AFRICAN AMERICAN: 25 mL/min — AB (ref >60)
Glucose, Bld: 168 mg/dL — ABNORMAL HIGH (ref 70–99)
Potassium: 3.6 mmol/L (ref 3.5–5.1)
Sodium: 140 mmol/L (ref 135–145)

## 2018-06-01 LAB — CBC
HCT: 28 % — ABNORMAL LOW (ref 39.0–52.0)
Hemoglobin: 8.4 g/dL — ABNORMAL LOW (ref 13.0–17.0)
MCH: 25.9 pg — ABNORMAL LOW (ref 26.0–34.0)
MCHC: 30 g/dL (ref 30.0–36.0)
MCV: 86.4 fL (ref 80.0–100.0)
Platelets: 93 10*3/uL — ABNORMAL LOW (ref 150–400)
RBC: 3.24 MIL/uL — ABNORMAL LOW (ref 4.22–5.81)
RDW: 20.5 % — AB (ref 11.5–15.5)
WBC: 7.2 10*3/uL (ref 4.0–10.5)
nRBC: 0 % (ref 0.0–0.2)

## 2018-06-01 LAB — GLUCOSE, CAPILLARY
Glucose-Capillary: 160 mg/dL — ABNORMAL HIGH (ref 70–99)
Glucose-Capillary: 227 mg/dL — ABNORMAL HIGH (ref 70–99)
Glucose-Capillary: 152 mg/dL — ABNORMAL HIGH (ref 70–99)
Glucose-Capillary: 166 mg/dL — ABNORMAL HIGH (ref 70–99)

## 2018-06-01 MED ORDER — SODIUM CHLORIDE 0.9 % IV SOLN
INTRAVENOUS | Status: AC
  Administered 2018-06-01: 13:00:00 via INTRAVENOUS

## 2018-06-01 NOTE — Progress Notes (Signed)
Subjective:  Domino pain is better he was recorded to have some fevers of the last 2 days.   Antibiotics:  Anti-infectives (From admission, onward)   Start     Dose/Rate Route Frequency Ordered Stop   05/31/18 1000  Ampicillin-Sulbactam (UNASYN) 3 g in sodium chloride 0.9 % 100 mL IVPB     3 g 200 mL/hr over 30 Minutes Intravenous Every 12 hours 05/31/18 0841     05/30/18 1500  DAPTOmycin (CUBICIN) 670 mg in sodium chloride 0.9 % IVPB  Status:  Discontinued     670 mg 226.8 mL/hr over 30 Minutes Intravenous Every 48 hours 05/30/18 1327 05/31/18 0835   05/29/18 1400  Ampicillin-Sulbactam (UNASYN) 3 g in sodium chloride 0.9 % 100 mL IVPB  Status:  Discontinued     3 g 200 mL/hr over 30 Minutes Intravenous Every 12 hours 05/29/18 1059 05/30/18 1326   05/29/18 1000  vancomycin (VANCOCIN) IVPB 750 mg/150 ml premix  Status:  Discontinued     750 mg 150 mL/hr over 60 Minutes Intravenous Every 24 hours 05/28/18 1004 05/29/18 1052   05/28/18 2200  ceFEPIme (MAXIPIME) 1 g in sodium chloride 0.9 % 100 mL IVPB  Status:  Discontinued     1 g 200 mL/hr over 30 Minutes Intravenous Every 12 hours 05/28/18 1004 05/29/18 1052   05/28/18 0845  ceFEPIme (MAXIPIME) 2 g in sodium chloride 0.9 % 100 mL IVPB     2 g 200 mL/hr over 30 Minutes Intravenous  Once 05/28/18 0834 05/28/18 0931   05/28/18 0845  vancomycin (VANCOCIN) IVPB 1000 mg/200 mL premix  Status:  Discontinued     1,000 mg 200 mL/hr over 60 Minutes Intravenous  Once 05/28/18 0834 05/28/18 0839   05/28/18 0845  vancomycin (VANCOCIN) 1,500 mg in sodium chloride 0.9 % 500 mL IVPB     1,500 mg 250 mL/hr over 120 Minutes Intravenous  Once 05/28/18 0839 05/28/18 1156      Medications: Scheduled Meds: . sodium chloride   Intravenous Once  . aspirin EC  81 mg Oral Daily  . clopidogrel  75 mg Oral Daily  . gabapentin  100 mg Oral QHS  . insulin aspart  0-5 Units Subcutaneous QHS  . insulin aspart  0-9 Units Subcutaneous TID WC    . insulin glargine  20 Units Subcutaneous q morning - 10a  . isosorbide mononitrate  60 mg Oral Daily  . metoprolol succinate  100 mg Oral Daily  . multivitamin with minerals  1 tablet Oral q morning - 10a  . naphazoline-pheniramine  1 drop Both Eyes Daily  . polyethylene glycol  17 g Oral Daily  . pravastatin  40 mg Oral q1800  . senna-docusate  1 tablet Oral BID  . tamsulosin  0.4 mg Oral Daily   Continuous Infusions: . sodium chloride    . ampicillin-sulbactam (UNASYN) IV 3 g (06/01/18 1011)   PRN Meds:.acetaminophen, loperamide, meclizine, neomycin-bacitracin-polymyxin, nitroGLYCERIN, traMADol    Objective: Weight change:   Intake/Output Summary (Last 24 hours) at 06/01/2018 1048 Last data filed at 06/01/2018 0300 Gross per 24 hour  Intake 200 ml  Output -  Net 200 ml   Blood pressure (!) 145/65, pulse 98, temperature 98.1 F (36.7 C), temperature source Oral, resp. rate 14, height 5\' 9"  (1.753 m), weight 83.4 kg, SpO2 97 %. Temp:  [98.1 F (36.7 C)-102.1 F (38.9 C)] 98.1 F (36.7 C) (12/12 0813) Pulse Rate:  [68-98] 98 (12/12 0813) Resp:  [  14] 14 (12/12 0014) BP: (123-145)/(61-65) 145/65 (12/12 0813) SpO2:  [93 %-98 %] 97 % (12/12 0813)  Physical Exam: General: Alert and awake, oriented x3, not in any acute distress. HEENT: anicteric sclera, EOMI CVS regular rate, normal  Chest: , no wheezing, no respiratory distress Abdomen: soft non-distended, nontender today Lesion on finger 05/30/2018:    Feet are clear of any lesions     Neuro: nonfocal  CBC:    BMET Recent Labs    05/31/18 0306 06/01/18 0253  NA 140 140  K 4.0 3.6  CL 107 105  CO2 20* 24  GLUCOSE 188* 168*  BUN 38* 47*  CREATININE 2.09* 2.29*  CALCIUM 8.4* 8.4*     Liver Panel  Recent Labs    05/31/18 0306  PROT 7.3  ALBUMIN 2.3*  AST 20  ALT 19  ALKPHOS 55  BILITOT 1.0       Sedimentation Rate No results for input(s): ESRSEDRATE in the last 72  hours. C-Reactive Protein No results for input(s): CRP in the last 72 hours.  Micro Results: Recent Results (from the past 720 hour(s))  Blood Culture (routine x 2)     Status: None (Preliminary result)   Collection Time: 05/28/18  8:34 AM  Result Value Ref Range Status   Specimen Description BLOOD BLOOD RIGHT FOREARM  Final   Special Requests   Final    BOTTLES DRAWN AEROBIC AND ANAEROBIC Blood Culture adequate volume   Culture   Final    NO GROWTH 3 DAYS Performed at [facility], [address] [place], [place] [address]    Report Status PENDING  Incomplete  Urine culture     Status: Abnormal   Collection Time: 05/28/18  8:34 AM  Result Value Ref Range Status   Specimen Description URINE, RANDOM  Final   Special Requests NONE  Final   Culture (A)  Final    <10,000 COLONIES/mL INSIGNIFICANT GROWTH Performed at [facility] [address], [place], [place] [address]    Report Status 05/29/2018 FINAL  Final  Blood Culture (routine x 2)     Status: Abnormal   Collection Time: 05/28/18  8:39 AM  Result Value Ref Range Status   Specimen Description BLOOD RIGHT ANTECUBITAL  Final   Special Requests   Final    BOTTLES DRAWN AEROBIC AND ANAEROBIC Blood Culture adequate volume   Culture  Setup Time   Final    GRAM POSITIVE COCCI AEROBIC BOTTLE ONLY CRITICAL RESULT CALLED TO, READ BACK BY AND VERIFIED WITH: Salli Real [id number] Performed at [facility], [place] [address], [place], [place] [address]    Culture ENTEROCOCCUS FAECIUM (A)  Final   Report Status 05/31/2018 FINAL  Final   Organism ID, Bacteria ENTEROCOCCUS FAECIUM  Final      Susceptibility   Enterococcus faecium - MIC*    AMPICILLIN <=2 SENSITIVE Sensitive     VANCOMYCIN 1 SENSITIVE Sensitive     GENTAMICIN SYNERGY SENSITIVE Sensitive     * ENTEROCOCCUS FAECIUM  Blood Culture ID Panel (Reflexed)     Status: Abnormal   Collection Time: 05/28/18  8:39 AM  Result Value Ref Range Status    Enterococcus species DETECTED (A) NOT DETECTED Final    Comment: CRITICAL RESULT CALLED TO, READ BACK BY AND VERIFIED WITH: PHARMD G ABBOTT [id number] MLM    Vancomycin resistance NOT DETECTED NOT DETECTED Final   Listeria monocytogenes NOT DETECTED NOT DETECTED Final  Staphylococcus species NOT DETECTED NOT DETECTED Final   Staphylococcus aureus (BCID) NOT DETECTED NOT DETECTED Final   Streptococcus species NOT DETECTED NOT DETECTED Final   Streptococcus agalactiae NOT DETECTED NOT DETECTED Final   Streptococcus pneumoniae NOT DETECTED NOT DETECTED Final   Streptococcus pyogenes NOT DETECTED NOT DETECTED Final   Acinetobacter baumannii NOT DETECTED NOT DETECTED Final   Enterobacteriaceae species NOT DETECTED NOT DETECTED Final   Enterobacter cloacae complex NOT DETECTED NOT DETECTED Final   Escherichia coli NOT DETECTED NOT DETECTED Final   Klebsiella oxytoca NOT DETECTED NOT DETECTED Final   Klebsiella pneumoniae NOT DETECTED NOT DETECTED Final   Proteus species NOT DETECTED NOT DETECTED Final   Serratia marcescens NOT DETECTED NOT DETECTED Final   Haemophilus influenzae NOT DETECTED NOT DETECTED Final   Neisseria meningitidis NOT DETECTED NOT DETECTED Final   Pseudomonas aeruginosa NOT DETECTED NOT DETECTED Final   Candida albicans NOT DETECTED NOT DETECTED Final   Candida glabrata NOT DETECTED NOT DETECTED Final   Candida krusei NOT DETECTED NOT DETECTED Final   Candida parapsilosis NOT DETECTED NOT DETECTED Final   Candida tropicalis NOT DETECTED NOT DETECTED Final    Comment: Performed at [facility], [place] [address], [place], [place] [address]  Respiratory Panel by PCR     Status: None   Collection Time: 05/28/18 12:38 PM  Result Value Ref Range Status   Adenovirus NOT DETECTED NOT DETECTED Final   Coronavirus 229E NOT DETECTED NOT DETECTED Final   Coronavirus HKU1 NOT DETECTED NOT DETECTED Final   Coronavirus NL63 NOT DETECTED NOT DETECTED Final   Coronavirus OC43  NOT DETECTED NOT DETECTED Final   Metapneumovirus NOT DETECTED NOT DETECTED Final   Rhinovirus / Enterovirus NOT DETECTED NOT DETECTED Final   Influenza A NOT DETECTED NOT DETECTED Final   Influenza B NOT DETECTED NOT DETECTED Final   Parainfluenza Virus 1 NOT DETECTED NOT DETECTED Final   Parainfluenza Virus 2 NOT DETECTED NOT DETECTED Final   Parainfluenza Virus 3 NOT DETECTED NOT DETECTED Final   Parainfluenza Virus 4 NOT DETECTED NOT DETECTED Final   Respiratory Syncytial Virus NOT DETECTED NOT DETECTED Final   Bordetella pertussis NOT DETECTED NOT DETECTED Final   Chlamydophila pneumoniae NOT DETECTED NOT DETECTED Final   Mycoplasma pneumoniae NOT DETECTED NOT DETECTED Final  Culture, blood (Routine X 2) w Reflex to ID Panel     Status: None (Preliminary result)   Collection Time: 05/29/18 11:40 AM  Result Value Ref Range Status   Specimen Description BLOOD RIGHT ANTECUBITAL  Final   Special Requests   Final    BOTTLES DRAWN AEROBIC ONLY Blood Culture adequate volume   Culture   Final    NO GROWTH 2 DAYS Performed at [facility], [address] [place], [place] [address]    Report Status PENDING  Incomplete  Culture, blood (Routine X 2) w Reflex to ID Panel     Status: None (Preliminary result)   Collection Time: 05/30/18  8:05 PM  Result Value Ref Range Status   Specimen Description BLOOD RIGHT HAND  Final   Special Requests   Final    BOTTLES DRAWN AEROBIC ONLY Blood Culture adequate volume   Culture   Final    NO GROWTH < 24 HOURS Performed at [facility], [place] [address] [place], [place] [address]    Report Status PENDING  Incomplete  Culture, blood (Routine X 2) w Reflex to ID Panel  Status: None (Preliminary result)   Collection Time: 05/30/18  8:10 PM  Result Value Ref Range Status   Specimen Description BLOOD RIGHT HAND  Final   Special Requests   Final    BOTTLES DRAWN AEROBIC ONLY Blood Culture adequate volume   Culture   Final    NO GROWTH <  24 HOURS Performed at [facility], [address] [place], [place] [address]    Report Status PENDING  Incomplete    Studies/Results: Ct Abdomen Pelvis Wo Contrast  Result Date: 05/31/2018 CLINICAL DATA:  Bacteremia. EXAM: CT ABDOMEN AND PELVIS WITHOUT CONTRAST TECHNIQUE: Multidetector CT imaging of the abdomen and pelvis was performed following the standard protocol without IV contrast. COMPARISON:  CT scan of April 14, 2018. FINDINGS: Lower chest: Mild right pleural effusion is noted with adjacent subsegmental atelectasis. Hepatobiliary: No focal liver abnormality is seen. No gallstones, gallbladder wall thickening, or biliary dilatation. Pancreas: Unremarkable. No pancreatic ductal dilatation or surrounding inflammatory changes. Spleen: Normal in size without focal abnormality. Adrenals/Urinary Tract: Adrenal glands appear normal. Stable exophytic right renal cyst is noted. No hydronephrosis or renal obstruction is noted. No renal or ureteral calculi are noted. Urinary bladder is unremarkable. Stomach/Bowel: Stomach is within normal limits. Appendix appears normal. No evidence of bowel wall thickening, distention, or inflammatory changes. Sigmoid diverticulosis is noted without inflammation. Vascular/Lymphatic: Aortic atherosclerosis. No enlarged abdominal or pelvic lymph nodes. Reproductive: Status post prostatic brachytherapy seed placement. Other: Large fat containing right inguinal hernia is noted which extends into the scrotum. This is stable compared to prior exam. Musculoskeletal: No acute or significant osseous findings. IMPRESSION: Mild right pleural effusion is noted with adjacent subsegmental atelectasis. Sigmoid diverticulosis without inflammation. Status post prostatic brachytherapy seed placement. Stable large fat containing right inguinal hernia is noted which extends into the scrotum. Aortic Atherosclerosis (ICD10-I70.0). Electronically Signed   By: Marijo Conception, M.D.   On:  05/31/2018 12:50   Dg Chest 2 View  Result Date: 05/31/2018 CLINICAL DATA:  New onset of fever. The patient has an upset stomach and is coughing. History of CHF, previous CVA, previous episodes of pneumonia, former smoker. EXAM: CHEST - 2 VIEW COMPARISON:  Portable chest x-ray of May 28, 2018 FINDINGS: There's been interval worsening in airspace opacity in the right upper lobe. The right lower lung is clear. The left lung is clear. The heart is top-normal in size. The pulmonary vascularity is not engorged. There is calcification in the wall of the aortic arch. IMPRESSION: Worsening airspace opacity in the right upper lobe compatible with pneumonia. No overt CHF. Thoracic aortic atherosclerosis. Electronically Signed   By: David  Martinique M.D.   On: 05/31/2018 12:09      Assessment/Plan:  INTERVAL HISTORY: The abdomen pelvis showed a effusion with some atelectasis at the lung base as well as some diverticulosis without diverticulitis.  He is still had some fevers in the last 2 days.  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 male with  With vascular disease, myelodysplastic syndrome coronary artery disease ITP who is been admitted with multifocal pneumonia  and enterococcal bacteremia.  #1 multifocal pneumonia with enterococcal bacteremia.  T abdomen did not show any intra-abdominal abscess.  There was an effusion present.  Given his ongoing fevers I will get a CT of the chest to better define effusion and his pneumonia  Continue Unasyn  Follow-up blood cultures  Would prefer to just  treat him with 2 weeks of IV antibiotics.     LOS: 4 days   Alcide Evener 06/01/2018, 10:48 AM

## 2018-06-01 NOTE — Progress Notes (Signed)
[facility] Progress Note  Patient: Ricardo Watkins PIR:[id number]   PCP: [name], MD DOB: 08-06-1930   DOA: 05/28/2018   DOS: 06/01/2018   Date of Service: the patient was seen and examined on 06/01/2018  Brief hospital course: Pt. with PMH of PVD s/p R renal artery stent; remote prostate CA s/p seed radiation; NHL; HTN; HLD; DM; chronic ITP; CHF; and CAD s/p stent(recentlydeemed not an interventional candidate)h/o 4 admission in 12 months and 13 ED visits in 12 months; admitted on 05/28/2018, presented with complaint of chills and chest pain, was found to have pneumonia and enterococcus bacteremia.  S/P 2 PRBC on 05/29/2018 -ID following, now on IV Unasyn  Subjective:   -Fever curve improving, still febrile overnight -No further vomiting since last evening, denies any abdominal pain or discomfort, had a bowel movement yesterday  Assessment and Plan: 1.  Sepsis/enterococcal bacteremia -Presumed to be secondary to healthcare associated pneumonia -Repeat chest x-ray 12/11 with worsening opacity in the right upper lobe consistent with pneumonia -Blood cultures grew enterococcus faecium -Antibiotics transitioned back to Unasyn per infectious disease 12/11 -Appreciate ID input -Blood cultures from 12/10 negative thus far -CT abdomen pelvis was benign -Continues to be febrile, but improving, T-max was 102  2.  Myelodysplastic syndrome with 5 q. Deletion. -With symptomatic anemia, chronic neutropenia and thrombocytopenia -Transfused 2 units of PRBC for hemoglobin of 6.9 on 12/9, hemoglobin and platelet count drifting down again, supportive care, transfuse as needed -He is also on Nplate and erythropoietin -Followed by oncology [name]  3.  CAD/chronic systolic and diastolic CHF -Echocardiogram in 9/2 0 19 showed EF of 30 to 63% with diastolic dysfunction -Repeat echo now showed improvement in EF to 50%, no overt vegetations -Lasix on hold due to worsening AKI -Continue  aspirin, Plavix, Toprol, Imdur  4.  Acute kidney injury on chronic kidney disease stage IV. Baseline renal function around 1.8-1.7 -Min was 2.4 on admission, trended down to 2.0, back up to 2.2 with worsening BUN as well, will give gentle fluids for 10 hours -Diuretics on hold in the setting of sepsis and high fevers  5. Type 2 Diabetes Mellitus, uncontroled with renal complication and neuropathic complication -Lantus and sliding scale -Hb A1c is 7.7  6.  Goals of care discussion. -Multiple frequent hospitalizations -Seen by palliative care for goals of care discussion, Patient is DNR/DNI for now.  Diet: regular diet thin DVT Prophylaxis: mechanical compression device  Advance goals of care discussion: DNR DNI  Family Communication: Wife and son at bedside today  Disposition: Home pending improvement in fevers and sepsis  Consultants: ID, Palliative care  Procedures: Echocardiogram   Scheduled Meds: . sodium chloride   Intravenous Once  . aspirin EC  81 mg Oral Daily  . clopidogrel  75 mg Oral Daily  . gabapentin  100 mg Oral QHS  . insulin aspart  0-5 Units Subcutaneous QHS  . insulin aspart  0-9 Units Subcutaneous TID WC  . insulin glargine  20 Units Subcutaneous q morning - 10a  . isosorbide mononitrate  60 mg Oral Daily  . metoprolol succinate  100 mg Oral Daily  . multivitamin with minerals  1 tablet Oral q morning - 10a  . naphazoline-pheniramine  1 drop Both Eyes Daily  . polyethylene glycol  17 g Oral Daily  . pravastatin  40 mg Oral q1800  . senna-docusate  1 tablet Oral BID  . tamsulosin  0.4 mg Oral Daily   Continuous Infusions: . ampicillin-sulbactam (  UNASYN) IV 3 g (06/01/18 1011)   PRN Meds: acetaminophen, loperamide, meclizine, neomycin-bacitracin-polymyxin, nitroGLYCERIN, traMADol Antibiotics: Anti-infectives (From admission, onward)   Start     Dose/Rate Route Frequency Ordered Stop   05/31/18 1000  Ampicillin-Sulbactam (UNASYN) 3 g in sodium  chloride 0.9 % 100 mL IVPB     3 g 200 mL/hr over 30 Minutes Intravenous Every 12 hours 05/31/18 0841     05/30/18 1500  DAPTOmycin (CUBICIN) 670 mg in sodium chloride 0.9 % IVPB  Status:  Discontinued     670 mg 226.8 mL/hr over 30 Minutes Intravenous Every 48 hours 05/30/18 1327 05/31/18 0835   05/29/18 1400  Ampicillin-Sulbactam (UNASYN) 3 g in sodium chloride 0.9 % 100 mL IVPB  Status:  Discontinued     3 g 200 mL/hr over 30 Minutes Intravenous Every 12 hours 05/29/18 1059 05/30/18 1326   05/29/18 1000  vancomycin (VANCOCIN) IVPB 750 mg/150 ml premix  Status:  Discontinued     750 mg 150 mL/hr over 60 Minutes Intravenous Every 24 hours 05/28/18 1004 05/29/18 1052   05/28/18 2200  ceFEPIme (MAXIPIME) 1 g in sodium chloride 0.9 % 100 mL IVPB  Status:  Discontinued     1 g 200 mL/hr over 30 Minutes Intravenous Every 12 hours 05/28/18 1004 05/29/18 1052   05/28/18 0845  ceFEPIme (MAXIPIME) 2 g in sodium chloride 0.9 % 100 mL IVPB     2 g 200 mL/hr over 30 Minutes Intravenous  Once 05/28/18 0834 05/28/18 0931   05/28/18 0845  vancomycin (VANCOCIN) IVPB 1000 mg/200 mL premix  Status:  Discontinued     1,000 mg 200 mL/hr over 60 Minutes Intravenous  Once 05/28/18 0834 05/28/18 0839   05/28/18 0845  vancomycin (VANCOCIN) 1,500 mg in sodium chloride 0.9 % 500 mL IVPB     1,500 mg 250 mL/hr over 120 Minutes Intravenous  Once 05/28/18 0839 05/28/18 1156       Objective: Physical Exam: Vitals:   05/31/18 1428 05/31/18 1627 06/01/18 0014 06/01/18 0813  BP:  123/62 130/61 (!) 145/65  Pulse:  87 68 98  Resp:   14   Temp: (!) 102.1 F (38.9 C) 98.6 F (37 C)  98.1 F (36.7 C)  TempSrc: Oral Oral  Oral  SpO2:  93% 98% 97%  Weight:      Height:        Intake/Output Summary (Last 24 hours) at 06/01/2018 1033 Last data filed at 06/01/2018 0300 Gross per 24 hour  Intake 200 ml  Output 1 ml  Net 199 ml   Filed Weights   05/28/18 0826  Weight: 83.4 kg   Gen: Awake, Alert, Oriented  X 3, elderly, ill-appearing male, appears more comfortable today HEENT: PERRLA, Neck supple, no JVD Lungs: Rhonchi in both lungs worse in the right upper lobe CVS: S1-S2/regular rate rhythm Abd: soft, Non tender, non distended, BS present, right groin inguinal hernia Extremities: No edema Skin: no new rashes  Data Reviewed: CBC: Recent Labs  Lab 05/28/18 0839 05/29/18 0539 05/29/18 1715 05/30/18 0645 05/30/18 0831 05/31/18 0735 06/01/18 0253  WBC 4.8 3.8* 4.8 3.9* 4.7 8.6 7.2  NEUTROABS 1.4* 1.2*  --   --  2.0 4.9  --   HGB 7.7* 6.5* 6.8* 8.7* 9.0* 9.0* 8.4*  HCT 27.0* 22.0* 22.4* 29.2* 28.9* 28.1* 28.0*  MCV 85.2 84.3 83.9 85.9 84.5 80.3 86.4  PLT 144* PLATELET CLUMPS NOTED ON SMEAR, UNABLE TO ESTIMATE 126* 105* 112* 115* 93*   Basic Metabolic  Panel: Recent Labs  Lab 05/28/18 0839 05/29/18 0539 05/30/18 0645 05/31/18 0306 06/01/18 0253  NA 141 142 143 140 140  K 3.9 3.9 3.9 4.0 3.6  CL 106 108 106 107 105  CO2 24 24 26  20* 24  GLUCOSE 169* 179* 161* 188* 168*  BUN 38* 39* 40* 38* 47*  CREATININE 2.11* 2.40* 2.28* 2.09* 2.29*  CALCIUM 8.7* 8.2* 8.6* 8.4* 8.4*  MG  --   --   --  2.0  --     Liver Function Tests: Recent Labs  Lab 05/28/18 0839 05/31/18 0306  AST 15 20  ALT 16 19  ALKPHOS 71 55  BILITOT 0.7 1.0  PROT 7.9 7.3  ALBUMIN 2.9* 2.3*   No results for input(s): LIPASE, AMYLASE in the last 168 hours. No results for input(s): AMMONIA in the last 168 hours. Coagulation Profile: No results for input(s): INR, PROTIME in the last 168 hours. Cardiac Enzymes: Recent Labs  Lab 05/31/18 0306  CKTOTAL 22*   BNP (last 3 results) No results for input(s): PROBNP in the last 8760 hours. CBG: Recent Labs  Lab 05/31/18 1312 05/31/18 1629 05/31/18 1945 05/31/18 2202 06/01/18 0812  GLUCAP 150* 182* 233* 199* 160*   Studies: Ct Abdomen Pelvis Wo Contrast  Result Date: 05/31/2018 CLINICAL DATA:  Bacteremia. EXAM: CT ABDOMEN AND PELVIS WITHOUT  CONTRAST TECHNIQUE: Multidetector CT imaging of the abdomen and pelvis was performed following the standard protocol without IV contrast. COMPARISON:  CT scan of April 14, 2018. FINDINGS: Lower chest: Mild right pleural effusion is noted with adjacent subsegmental atelectasis. Hepatobiliary: No focal liver abnormality is seen. No gallstones, gallbladder wall thickening, or biliary dilatation. Pancreas: Unremarkable. No pancreatic ductal dilatation or surrounding inflammatory changes. Spleen: Normal in size without focal abnormality. Adrenals/Urinary Tract: Adrenal glands appear normal. Stable exophytic right renal cyst is noted. No hydronephrosis or renal obstruction is noted. No renal or ureteral calculi are noted. Urinary bladder is unremarkable. Stomach/Bowel: Stomach is within normal limits. Appendix appears normal. No evidence of bowel wall thickening, distention, or inflammatory changes. Sigmoid diverticulosis is noted without inflammation. Vascular/Lymphatic: Aortic atherosclerosis. No enlarged abdominal or pelvic lymph nodes. Reproductive: Status post prostatic brachytherapy seed placement. Other: Large fat containing right inguinal hernia is noted which extends into the scrotum. This is stable compared to prior exam. Musculoskeletal: No acute or significant osseous findings. IMPRESSION: Mild right pleural effusion is noted with adjacent subsegmental atelectasis. Sigmoid diverticulosis without inflammation. Status post prostatic brachytherapy seed placement. Stable large fat containing right inguinal hernia is noted which extends into the scrotum. Aortic Atherosclerosis (ICD10-I70.0). Electronically Signed   By: Marijo Conception, M.D.   On: 05/31/2018 12:50   Dg Chest 2 View  Result Date: 05/31/2018 CLINICAL DATA:  New onset of fever. The patient has an upset stomach and is coughing. History of CHF, previous CVA, previous episodes of pneumonia, former smoker. EXAM: CHEST - 2 VIEW COMPARISON:  Portable  chest x-ray of May 28, 2018 FINDINGS: There's been interval worsening in airspace opacity in the right upper lobe. The right lower lung is clear. The left lung is clear. The heart is top-normal in size. The pulmonary vascularity is not engorged. There is calcification in the wall of the aortic arch. IMPRESSION: Worsening airspace opacity in the right upper lobe compatible with pneumonia. No overt CHF. Thoracic aortic atherosclerosis. Electronically Signed   By: David  Martinique M.D.   On: 05/31/2018 12:09     Time spent: 25 minutes  Domenic Polite ,  MD [facility] Page via [email or web address] 06/01/2018 10:33 AM  Between 7PM-7AM, please contact night-coverage at [email or web address], password [facility]

## 2018-06-01 NOTE — Progress Notes (Signed)
Physical Therapy Treatment Patient Details Name: Ricardo Watkins MRN: [id number] DOB: 1931-04-26 Today's Date: 06/01/2018    History of Present Illness 84 y.o. male admitted on 05/28/18 for weakness and cough.  Found to have sepsis due to HCAP with enterococcus bacteremia, myelodysplastic syndrome, symptomatic anemia s/p 2 Units PRBC, acute kidney injury on chronic kidney disease.  Pt with other significant PMH of DM 2, CKD, CAD, essential HTN, chronic combined diastolic and systolic CHF, chronic neutropenia, chronic thrombocytopenia, DVT, non  Hodgkin's lymphoma, stroke, and PVD.    PT Comments    Patient cont to require hands on guarding for transfers, ADLs, and gait. Wife present and patient reports he will have plenty of help from 4 children who all live within miles of them. Did not desat on RA at rest, but with activity slightly, 2L maintained 88 or higher, returns quickly to 94% SpO2 with short rest break on 2L. Unable to progress further ambulation due to fatigue.    Follow Up Recommendations  Home health PT Supervision for OOB mobility.      Equipment Recommendations  Other (comment)    Recommendations for Other Services OT consult     Precautions / Restrictions Precautions Precautions: Fall;Other (comment) Precaution Comments: monitor O2 sats Restrictions Weight Bearing Restrictions: No    Mobility  Bed Mobility Overal bed mobility: Needs Assistance Bed Mobility: Supine to Sit     Supine to sit: Min assist        Transfers Overall transfer level: Needs assistance Equipment used: Rolling walker (2 wheeled) Transfers: Sit to/from Stand Sit to Stand: Min assist;Min guard         General transfer comment: min guard to stand, min A to provide satbility at times when attempting to perform pericare pt not independent with   Ambulation/Gait Ambulation/Gait assistance: Min guard Gait Distance (Feet): 20 Feet Assistive device: Rolling walker (2 wheeled) Gait  Pattern/deviations: Step-through pattern;Shuffle;Trunk flexed Gait velocity: decreased   General Gait Details: Pt fatiguing quickly while walking on 2 L desats to 86 but quickly returns to 94 with rest.    Stairs             Wheelchair Mobility    Modified Rankin (Stroke Patients Only)       Balance Overall balance assessment: Needs assistance Sitting-balance support: Feet supported;Bilateral upper extremity supported Sitting balance-Leahy Scale: Fair     Standing balance support: Bilateral upper extremity supported Standing balance-Leahy Scale: Poor Standing balance comment: needs support of RW and therapist in standing.                             Cognition Arousal/Alertness: Awake/alert Behavior During Therapy: WFL for tasks assessed/performed Overall Cognitive Status: Within Functional Limits for tasks assessed                                 General Comments: No obvious cognitive deficits.  Not specifically tested.      Exercises      General Comments        Pertinent Vitals/Pain Pain Assessment: No/denies pain    Home Living                      Prior Function            PT Goals (current goals can now be found in the care plan section) Acute Rehab  PT Goals Patient Stated Goal: to get stronger and go home PT Goal Formulation: With patient/family Time For Goal Achievement: 06/13/18 Potential to Achieve Goals: Good Progress towards PT goals: Progressing toward goals    Frequency    Min 3X/week      PT Plan Current plan remains appropriate    Co-evaluation              AM-PAC PT "6 Clicks" Mobility   Outcome Measure  Help needed turning from your back to your side while in a flat bed without using bedrails?: A Little Help needed moving from lying on your back to sitting on the side of a flat bed without using bedrails?: A Little Help needed moving to and from a bed to a chair (including a  wheelchair)?: A Little Help needed standing up from a chair using your arms (e.g., wheelchair or bedside chair)?: A Little Help needed to walk in hospital room?: A Little Help needed climbing 3-5 steps with a railing? : A Lot 6 Click Score: 17    End of Session Equipment Utilized During Treatment: Gait belt Activity Tolerance: Patient limited by fatigue Patient left: in chair;with call bell/phone within reach;with family/visitor present Nurse Communication: Mobility status;Other (comment) PT Visit Diagnosis: Muscle weakness (generalized) (M62.81);Difficulty in walking, not elsewhere classified (R26.2)     Time: 1316-1340 PT Time Calculation (min) (ACUTE ONLY): 24 min  Charges:  $Gait Training: 8-22 mins $Therapeutic Activity: 8-22 mins                     Reinaldo Berber, PT, DPT Acute Rehabilitation Services Pager: [phone] Office: [phone]     Reinaldo Berber 06/01/2018, 2:08 PM

## 2018-06-01 NOTE — Evaluation (Signed)
Occupational Therapy Evaluation Patient Details Name: Ricardo Watkins MRN: [id number] DOB: [birth year] Today's Date: 06/01/2018    History of Present Illness 84 y.o. male admitted on 05/28/18 for weakness and cough.  Found to have sepsis due to HCAP with enterococcus bacteremia, myelodysplastic syndrome, symptomatic anemia s/p 2 Units PRBC, acute kidney injury on chronic kidney disease.  Pt with other significant PMH of DM 2, CKD, CAD, essential HTN, chronic combined diastolic and systolic CHF, chronic neutropenia, chronic thrombocytopenia, DVT, non  Hodgkin's lymphoma, stroke, and PVD.   Clinical Impression   Pt typically walks with a cane and completes self care modified independently. He likes to drive his gator down to his pond on his property with his dog. Pt appears to have baseline impairment in cognition with poor safety awareness. He presents with generalized weakness and decreased standing balance. He requires min to max assist for ADL. Recommending continued OT in SNF upon discharge. Will follow acutely.    Follow Up Recommendations  SNF;Supervision/Assistance - 24 hour    Equipment Recommendations  3 in 1 bedside commode    Recommendations for Other Services       Precautions / Restrictions Precautions Precautions: Fall Precaution Comments: monitor O2 sats Restrictions Weight Bearing Restrictions: No      Mobility Bed Mobility Overal bed mobility: Needs Assistance Bed Mobility: Supine to Sit;Sit to Supine     Supine to sit: Min assist Sit to supine: Mod assist   General bed mobility comments: min assist for trunk to raise trunk, mod assist for LEs back   Transfers Overall transfer level: Needs assistance Equipment used: Rolling walker (2 wheeled) Transfers: Sit to/from [facility] Sit to Stand: Min assist;Min guard Stand pivot transfers: Min assist       General transfer comment: min assist to stand from bed, min guard from [facility]    Balance  Overall balance assessment: Needs assistance Sitting-balance support: Feet supported;Bilateral upper extremity supported Sitting balance-Leahy Scale: Fair     Standing balance support: Bilateral upper extremity supported Standing balance-Leahy Scale: Poor Standing balance comment: needs support of RW and therapist in standing.                            ADL either performed or assessed with clinical judgement   ADL Overall ADL's : Needs assistance/impaired Eating/Feeding: Maximal assistance;Bed level Eating/Feeding Details (indicate cue type and reason): pt with difficult self feeding at bed level, family assisting Grooming: Wash/dry hands;Wash/dry face;Bed level;Set up   Upper Body Bathing: Moderate assistance;Sitting   Lower Body Bathing: Maximal assistance;Sit to/from stand   Upper Body Dressing : Moderate assistance;Sitting   Lower Body Dressing: Maximal assistance;Sit to/from stand   Toilet Transfer: Minimal assistance;Stand-pivot;RW;BSC   Toileting- Clothing Manipulation and Hygiene: Maximal assistance;Sit to/from stand               Vision Baseline Vision/History: Wears glasses Wears Glasses: Reading only Patient Visual Report: No change from baseline       Perception     Praxis      Pertinent Vitals/Pain Pain Assessment: No/denies pain     Hand Dominance Right   Extremity/Trunk Assessment Upper Extremity Assessment Upper Extremity Assessment: Generalized weakness(weakness greater proximally)   Lower Extremity Assessment Lower Extremity Assessment: Defer to PT evaluation       Communication Communication Communication: HOH   Cognition Arousal/Alertness: Awake/alert Behavior During Therapy: WFL for tasks assessed/performed Overall Cognitive Status: Impaired/Different from baseline Area of Impairment:  Memory;Problem solving                     Memory: Decreased short-term memory       Problem Solving: Slow  processing General Comments: family filling in details of PLOF and home set up, per family pt drives even though he has been told not to   General Comments       Exercises     Shoulder Instructions      Home Living Family/patient expects to be discharged to:: Private residence Living Arrangements: Spouse/significant other Available Help at Discharge: Family;Available 24 hours/day Type of Home: House Home Access: Ramped entrance     Home Layout: One level     Bathroom Shower/Tub: Occupational psychologist: Handicapped height     Home Equipment: Environmental consultant - 2 wheels;Cane - single point;Shower seat          Prior Functioning/Environment Level of Independence: Independent with assistive device(s)        Comments: Per pt and wife he uses a cane all the time for gait. Per daughter, MD has been trying to get him to use a RW for a while now and he will not.         OT Problem List: Decreased strength;Decreased activity tolerance;Impaired balance (sitting and/or standing);Decreased cognition;Decreased safety awareness;Decreased knowledge of use of DME or AE      OT Treatment/Interventions: Self-care/ADL training;DME and/or AE instruction;Patient/family education;Balance training;Therapeutic activities    OT Goals(Current goals can be found in the care plan section) Acute Rehab OT Goals Patient Stated Goal: to go home and see his dog, Spanky OT Goal Formulation: With patient Time For Goal Achievement: 06/15/18 Potential to Achieve Goals: Good ADL Goals Pt Will Perform Eating: with set-up;sitting Pt Will Perform Grooming: with set-up;sitting Pt Will Perform Upper Body Bathing: with min assist;sitting Pt Will Perform Upper Body Dressing: with min assist;sitting Pt Will Transfer to Toilet: with min guard assist;ambulating;bedside commode Additional ADL Goal #1: pt will perform bed mobility with min guard assist in preparation for ADL.  OT Frequency: Min 2X/week    Barriers to D/C:            Co-evaluation              AM-PAC OT "6 Clicks" Daily Activity     Outcome Measure Help from another person eating meals?: Total Help from another person taking care of personal grooming?: A Lot Help from another person toileting, which includes using toliet, bedpan, or urinal?: A Lot Help from another person bathing (including washing, rinsing, drying)?: A Lot Help from another person to put on and taking off regular upper body clothing?: A Lot Help from another person to put on and taking off regular lower body clothing?: A Lot 6 Click Score: 11   End of Session Equipment Utilized During Treatment: Gait belt;Rolling walker  Activity Tolerance: Patient tolerated treatment well Patient left: in bed;with call bell/phone within reach;with family/visitor present  OT Visit Diagnosis: Unsteadiness on feet (R26.81);Other abnormalities of gait and mobility (R26.89);Other symptoms and signs involving cognitive function;History of falling (Z91.81);Muscle weakness (generalized) (M62.81)                Time: 9485-4627 OT Time Calculation (min): 19 min Charges:  OT General Charges $OT Visit: 1 Visit OT Evaluation $OT Eval Moderate Complexity: 1 Mod  Nestor Lewandowsky, OTR/L Acute Rehabilitation Services Pager: [phone] Office: [phone]  Malka So 06/01/2018, 3:52 PM

## 2018-06-02 DIAGNOSIS — R918 Other nonspecific abnormal finding of lung field: Secondary | ICD-10-CM

## 2018-06-02 LAB — CULTURE, BLOOD (ROUTINE X 2)
Culture: NO GROWTH
Special Requests: ADEQUATE

## 2018-06-02 LAB — CBC
HCT: 24.5 % — ABNORMAL LOW (ref 39.0–52.0)
HEMOGLOBIN: 7.2 g/dL — AB (ref 13.0–17.0)
MCH: 25.5 pg — ABNORMAL LOW (ref 26.0–34.0)
MCHC: 29.4 g/dL — AB (ref 30.0–36.0)
MCV: 86.9 fL (ref 80.0–100.0)
Platelets: 95 10*3/uL — ABNORMAL LOW (ref 150–400)
RBC: 2.82 MIL/uL — ABNORMAL LOW (ref 4.22–5.81)
RDW: 20.9 % — ABNORMAL HIGH (ref 11.5–15.5)
WBC: 5.2 10*3/uL (ref 4.0–10.5)
nRBC: 0 % (ref 0.0–0.2)

## 2018-06-02 LAB — BASIC METABOLIC PANEL
Anion gap: 10 (ref 5–15)
BUN: 50 mg/dL — ABNORMAL HIGH (ref 8–23)
CHLORIDE: 105 mmol/L (ref 98–111)
CO2: 24 mmol/L (ref 22–32)
Calcium: 8 mg/dL — ABNORMAL LOW (ref 8.9–10.3)
Creatinine, Ser: 2.2 mg/dL — ABNORMAL HIGH (ref 0.61–1.24)
GFR calc non Af Amer: 26 mL/min — ABNORMAL LOW (ref >60)
GFR, EST AFRICAN AMERICAN: 31 mL/min — AB (ref >60)
Glucose, Bld: 132 mg/dL — ABNORMAL HIGH (ref 70–99)
Potassium: 3.6 mmol/L (ref 3.5–5.1)
Sodium: 139 mmol/L (ref 135–145)

## 2018-06-02 LAB — GLUCOSE, CAPILLARY
GLUCOSE-CAPILLARY: 152 mg/dL — AB (ref 70–99)
Glucose-Capillary: 134 mg/dL — ABNORMAL HIGH (ref 70–99)
Glucose-Capillary: 151 mg/dL — ABNORMAL HIGH (ref 70–99)
Glucose-Capillary: 109 mg/dL — ABNORMAL HIGH (ref 70–99)

## 2018-06-02 LAB — PROTEIN, TOTAL: Total Protein: 6.3 g/dL — ABNORMAL LOW (ref 6.5–8.1)

## 2018-06-02 LAB — LACTATE DEHYDROGENASE: LDH: 962 U/L — ABNORMAL HIGH (ref 98–192)

## 2018-06-02 LAB — PREPARE RBC (CROSSMATCH)

## 2018-06-02 MED ORDER — SODIUM CHLORIDE 0.9% IV SOLUTION
Freq: Once | INTRAVENOUS | Status: AC
  Administered 2018-06-02: 09:00:00 via INTRAVENOUS

## 2018-06-02 MED ORDER — INSULIN ASPART 100 UNIT/ML ~~LOC~~ SOLN
0.0000 [IU] | Freq: Three times a day (TID) | SUBCUTANEOUS | Status: DC
  Administered 2018-06-02 (×2): 2 [IU] via SUBCUTANEOUS
  Administered 2018-06-03 (×2): 3 [IU] via SUBCUTANEOUS
  Administered 2018-06-05: 2 [IU] via SUBCUTANEOUS
  Administered 2018-06-06: 1 [IU] via SUBCUTANEOUS
  Administered 2018-06-06 (×2): 2 [IU] via SUBCUTANEOUS
  Administered 2018-06-07 (×2): 3 [IU] via SUBCUTANEOUS
  Administered 2018-06-07: 1 [IU] via SUBCUTANEOUS
  Administered 2018-06-08 (×2): 3 [IU] via SUBCUTANEOUS
  Administered 2018-06-09: 2 [IU] via SUBCUTANEOUS
  Administered 2018-06-09: 3 [IU] via SUBCUTANEOUS
  Administered 2018-06-10 (×2): 1 [IU] via SUBCUTANEOUS
  Administered 2018-06-10 – 2018-06-11 (×2): 2 [IU] via SUBCUTANEOUS
  Administered 2018-06-11: 3 [IU] via SUBCUTANEOUS
  Administered 2018-06-12 (×2): 2 [IU] via SUBCUTANEOUS

## 2018-06-02 MED ORDER — ONDANSETRON HCL 4 MG/2ML IJ SOLN
4.0000 mg | Freq: Four times a day (QID) | INTRAMUSCULAR | Status: DC | PRN
  Administered 2018-06-04: 4 mg via INTRAVENOUS
  Filled 2018-06-02: qty 2

## 2018-06-02 NOTE — Progress Notes (Signed)
Pharmacy Antibiotic Note  Ricardo Watkins is a 84 y.o. male admitted on 05/28/2018 with enterococcus faecium bacteremia. Patient is currently on D#5 of Unasyn. 12/9 repeat blood cultures are NGTD. WBC WNL and fever curve improved. Planning 2 weeks of therapy. Awaiting work-up of lung mass.   Plan: Unasyn 3 gm every 12 hours Monitor renal function, CT, repeat cultures  Stop date: 06/12/18 Pharmacy will sign off and monitor   Height: 5\' 9"  (175.3 cm) Weight: 183 lb 13.8 oz (83.4 kg) IBW/kg (Calculated) : 70.7  Temp (24hrs), Avg:98.5 F (36.9 C), Min:97.8 F (36.6 C), Max:99.8 F (37.7 C)  Recent Labs  Lab 05/28/18 0849 05/28/18 1038 05/29/18 0539 05/29/18 0949  05/30/18 0645 05/30/18 0831 05/31/18 0306 05/31/18 0735 06/01/18 0253 06/02/18 0254  WBC  --   --  3.8*  --    < > 3.9* 4.7  --  8.6 7.2 5.2  CREATININE  --   --  2.40*  --   --  2.28*  --  2.09*  --  2.29* 2.20*  LATICACIDVEN 1.43 0.52  --   --   --   --   --   --   --   --   --   VANCOTROUGH  --   --   --  10*  --   --   --   --   --   --   --    < > = values in this interval not displayed.    Estimated Creatinine Clearance: 24.5 mL/min (A) (by C-G formula based on SCr of 2.2 mg/dL (H)).    Allergies  Allergen Reactions  . Glucophage [Metformin Hydrochloride] Other (See Comments)    Chest pain  . Zetia [Ezetimibe] Other (See Comments)    weakness  . Fenofibrate Rash  . Niacin-Lovastatin Er Rash    Antimicrobials this admission: Unasyn 12/9>>12/10, 12/11>> Daptomycin 12/11>>12/10 Vanc 12/8>>12/9 Cefepime 12/8>> 12/9    Microbiology results: 12/8 BCx: Enterococcus faecium (S- amp, vanc) 12/9 BCx: NGTD  Thank you for allowing pharmacy to be a part of this patient's care.  Jimmy Footman, PharmD, BCPS, BCIDP Infectious Diseases Clinical Pharmacist Phone: [phone] 06/02/2018 8:55 AM

## 2018-06-02 NOTE — Progress Notes (Signed)
Pt feels poorly today, but vitals are stable and he is afebrile. Awaiting thoracentesis. Will transfuse PRBCs after thoracentesis, per order instructions.

## 2018-06-02 NOTE — Consult Note (Signed)
   [facility] CM Inpatient Consult   06/02/2018  MALIKAH LAKEY 1931-02-18 [id number]     Patient screened for potential [facility] Care Management services due to unplanned readmission risk score of 43% (extreme) and multiple hospitalizations.   Spoke with inpatient RNCM. Patient with overall poor prognosis. Reviewed palliative care consult notes. Will continue to follow along for disposition plans, progression, and potential [facility] Care Management needs.    Marthenia Rolling, MSN-Ed, RN,BSN [facility] Liaison [phone]

## 2018-06-02 NOTE — Progress Notes (Signed)
[facility] Progress Note  Patient: Ricardo Watkins JQB:[id number]   PCP: [name], MD DOB: 1930/08/23   DOA: 05/28/2018   DOS: 06/02/2018   Date of Service: the patient was seen and examined on 06/02/2018  Brief hospital course: Pt. with PMH of PVD s/p R renal artery stent; remote prostate CA s/p seed radiation; NHL; HTN; HLD; DM; chronic ITP; CHF; and CAD s/p stent(recentlydeemed not an interventional candidate)h/o 4 admission in 12 months and 13 ED visits in 12 months; admitted on 05/28/2018, presented with complaint of chills and chest pain, was found to have pneumonia and enterococcus bacteremia.  S/P 2 PRBC on 05/29/2018 -ID following, now on IV Unasyn -CT chest with rounded dense masslike opacity in the right upper lobe, clearing rounded pneumonia versus mass/cancer and a spiculated nodule in the left upper lobe as well  Subjective:   -Feels poorly, some cough and dyspnea  Assessment and Plan: 1.  Sepsis/enterococcal bacteremia -Presumed to be secondary to healthcare associated pneumonia, present on admission -Repeat chest x-ray 12/11 with worsening opacity in the right upper lobe consistent with pneumonia -Blood cultures grew enterococcus faecium -Antibiotics transitioned back to Unasyn per infectious disease 12/11 -Appreciate ID input, 2-week course of Unasyn recommended until 12/23 -Blood cultures from 12/10 negative thus far -CT abdomen pelvis was benign, CT chest as noted above -fever curve appears to be improving  2.    Moderate right pleural effusion -Likely associated with right upper lobe pneumonia vs mass -Ordered ultrasound-guided thoracentesis, diagnostic and therapeutic, follow-up fluid culture, cell count, LDH, protein and cytology  3. Myelodysplastic syndrome with 5 q. Deletion. -With symptomatic anemia, chronic neutropenia and thrombocytopenia -Transfused 2 units of PRBC for hemoglobin of 6.9 on 12/9, hemoglobin and platelet count drifting down  again, supportive care -hb is down to 7.2 today, will transfuse another unit of PRBC -He is also on Nplate and erythropoietin -Followed by oncology [name]  3.  CAD/chronic systolic and diastolic CHF -Echocardiogram in 9/2 0 19 showed EF of 30 to 40% with diastolic dysfunction -Repeat echo now showed improvement in EF to 50%, no overt vegetations -Lasix on hold due to worsening AKI -Continue aspirin, Plavix, Toprol, Imdur  4.  Acute kidney injury on chronic kidney disease stage IV. Baseline renal function around 1.8-1.7 -Creatinine was 2.4 on admission, trended down to 2.0, back up to 2.2 with worsening BUN as well -likely due to ongoing sepsis -Diuretics on hold in the setting of sepsis and high fevers  5. Type 2 Diabetes Mellitus, uncontroled with renal complication and neuropathic complication -Lantus and sliding scale -Hb A1c is 7.7  6.  Goals of care discussion. -Multiple frequent hospitalizations -Seen by palliative care for goals of care discussion, Patient is DNR/DNI -Prognosis appears poor overall, discussed this with daughter at bedside today  Diet: regular diet thin DVT Prophylaxis: mechanical compression device  Advance goals of care discussion: DNR DNI  Family Communication: Daughter at bedside today  Disposition: To be determined  Consultants: ID, Palliative care  Procedures: Echocardiogram   Scheduled Meds: . sodium chloride   Intravenous Once  . aspirin EC  81 mg Oral Daily  . clopidogrel  75 mg Oral Daily  . gabapentin  100 mg Oral QHS  . insulin aspart  0-5 Units Subcutaneous QHS  . insulin aspart  0-9 Units Subcutaneous TID WC  . insulin glargine  20 Units Subcutaneous q morning - 10a  . isosorbide mononitrate  60 mg Oral Daily  . metoprolol succinate  100 mg Oral Daily  . multivitamin with minerals  1 tablet Oral q morning - 10a  . naphazoline-pheniramine  1 drop Both Eyes Daily  . polyethylene glycol  17 g Oral Daily  . pravastatin  40 mg  Oral q1800  . senna-docusate  1 tablet Oral BID  . tamsulosin  0.4 mg Oral Daily   Continuous Infusions: . ampicillin-sulbactam (UNASYN) IV 3 g (06/02/18 0927)   PRN Meds: acetaminophen, loperamide, meclizine, neomycin-bacitracin-polymyxin, nitroGLYCERIN, traMADol Antibiotics: Anti-infectives (From admission, onward)   Start     Dose/Rate Route Frequency Ordered Stop   05/31/18 1000  Ampicillin-Sulbactam (UNASYN) 3 g in sodium chloride 0.9 % 100 mL IVPB     3 g 200 mL/hr over 30 Minutes Intravenous Every 12 hours 05/31/18 0841 06/12/18 2359   05/30/18 1500  DAPTOmycin (CUBICIN) 670 mg in sodium chloride 0.9 % IVPB  Status:  Discontinued     670 mg 226.8 mL/hr over 30 Minutes Intravenous Every 48 hours 05/30/18 1327 05/31/18 0835   05/29/18 1400  Ampicillin-Sulbactam (UNASYN) 3 g in sodium chloride 0.9 % 100 mL IVPB  Status:  Discontinued     3 g 200 mL/hr over 30 Minutes Intravenous Every 12 hours 05/29/18 1059 05/30/18 1326   05/29/18 1000  vancomycin (VANCOCIN) IVPB 750 mg/150 ml premix  Status:  Discontinued     750 mg 150 mL/hr over 60 Minutes Intravenous Every 24 hours 05/28/18 1004 05/29/18 1052   05/28/18 2200  ceFEPIme (MAXIPIME) 1 g in sodium chloride 0.9 % 100 mL IVPB  Status:  Discontinued     1 g 200 mL/hr over 30 Minutes Intravenous Every 12 hours 05/28/18 1004 05/29/18 1052   05/28/18 0845  ceFEPIme (MAXIPIME) 2 g in sodium chloride 0.9 % 100 mL IVPB     2 g 200 mL/hr over 30 Minutes Intravenous  Once 05/28/18 0834 05/28/18 0931   05/28/18 0845  vancomycin (VANCOCIN) IVPB 1000 mg/200 mL premix  Status:  Discontinued     1,000 mg 200 mL/hr over 60 Minutes Intravenous  Once 05/28/18 0834 05/28/18 0839   05/28/18 0845  vancomycin (VANCOCIN) 1,500 mg in sodium chloride 0.9 % 500 mL IVPB     1,500 mg 250 mL/hr over 120 Minutes Intravenous  Once 05/28/18 0839 05/28/18 1156       Objective: Physical Exam: Vitals:   06/01/18 1237 06/01/18 1805 06/01/18 2324 06/02/18  0757  BP:  (!) 107/54 (!) 127/58 136/71  Pulse:  92 97 (!) 107  Resp:   16   Temp: 99.8 F (37.7 C) 97.8 F (36.6 C) 98.4 F (36.9 C) 97.9 F (36.6 C)  TempSrc: Oral  Oral Oral  SpO2:  100% 98% 93%  Weight:      Height:        Intake/Output Summary (Last 24 hours) at 06/02/2018 1050 Last data filed at 06/02/2018 0400 Gross per 24 hour  Intake 200 ml  Output -  Net 200 ml   Filed Weights   05/28/18 0826  Weight: 83.4 kg   Gen: Awake, Alert, Oriented X 3, elderly, chronically ill-appearing HEENT: PERRLA, Neck supple, no JVD Lungs: Decreased breath sounds at both bases, worse on the right CVS: S1-S2/regular rate rhythm Abd: soft, Non tender, non distended, BS present Extremities: trace edema  Skin: no new rashes  Data Reviewed: CBC: Recent Labs  Lab 05/28/18 0839 05/29/18 0539  05/30/18 0645 05/30/18 0831 05/31/18 0735 06/01/18 0253 06/02/18 0254  WBC 4.8 3.8*   < > 3.9*  4.7 8.6 7.2 5.2  NEUTROABS 1.4* 1.2*  --   --  2.0 4.9  --   --   HGB 7.7* 6.5*   < > 8.7* 9.0* 9.0* 8.4* 7.2*  HCT 27.0* 22.0*   < > 29.2* 28.9* 28.1* 28.0* 24.5*  MCV 85.2 84.3   < > 85.9 84.5 80.3 86.4 86.9  PLT 144* PLATELET CLUMPS NOTED ON SMEAR, UNABLE TO ESTIMATE   < > 105* 112* 115* 93* 95*   < > = values in this interval not displayed.   Basic Metabolic Panel: Recent Labs  Lab 05/29/18 0539 05/30/18 0645 05/31/18 0306 06/01/18 0253 06/02/18 0254  NA 142 143 140 140 139  K 3.9 3.9 4.0 3.6 3.6  CL 108 106 107 105 105  CO2 24 26 20* 24 24  GLUCOSE 179* 161* 188* 168* 132*  BUN 39* 40* 38* 47* 50*  CREATININE 2.40* 2.28* 2.09* 2.29* 2.20*  CALCIUM 8.2* 8.6* 8.4* 8.4* 8.0*  MG  --   --  2.0  --   --     Liver Function Tests: Recent Labs  Lab 05/28/18 0839 05/31/18 0306 06/02/18 0254  AST 15 20  --   ALT 16 19  --   ALKPHOS 71 55  --   BILITOT 0.7 1.0  --   PROT 7.9 7.3 6.3*  ALBUMIN 2.9* 2.3*  --    No results for input(s): LIPASE, AMYLASE in the last 168  hours. No results for input(s): AMMONIA in the last 168 hours. Coagulation Profile: No results for input(s): INR, PROTIME in the last 168 hours. Cardiac Enzymes: Recent Labs  Lab 05/31/18 0306  CKTOTAL 22*   BNP (last 3 results) No results for input(s): PROBNP in the last 8760 hours. CBG: Recent Labs  Lab 06/01/18 0812 06/01/18 1215 06/01/18 1724 06/01/18 2136 06/02/18 0833  GLUCAP 160* 227* 152* 166* 134*   Studies: Ct Chest Wo Contrast  Result Date: 06/01/2018 CLINICAL DATA:  Fever for 2 days. EXAM: CT CHEST WITHOUT CONTRAST TECHNIQUE: Multidetector CT imaging of the chest was performed following the standard protocol without IV contrast. COMPARISON:  Chest x-ray 05/31/2018 FINDINGS: Cardiovascular: Aortic atherosclerosis and diffuse coronary artery calcifications. Aorta is normal caliber. Heart is normal size. Mediastinum/Nodes: Mildly enlarged mediastinal lymph nodes. Subcarinal lymph node has a short axis diameter of 15 mm. No hilar or visible axillary adenopathy. Lungs/Pleura: Small left effusion and moderate right effusion. Rounded masslike opacity noted in the right upper lobe with surrounding ground-glass disease. Ground-glass disease throughout much of the right upper lobe. Compressive atelectasis in the right lower lobe. Spiculated nodule in the posterior left upper lobe measures 11 mm. Upper Abdomen: Imaging into the upper abdomen shows no acute findings. Musculoskeletal: Chest wall soft tissues are unremarkable. No acute bony abnormality. IMPRESSION: Rounded dense masslike consolidation in the right upper lobe with surrounding ground-glass disease throughout much of the right upper lobe. This could reflect large pulmonary mass/lung cancer or rounded pneumonia. Spiculated 11 mm nodule in the left upper lobe. Can not exclude primary lung cancer or metastasis. Mild mediastinal adenopathy. Moderate right pleural effusion and small left pleural effusion. Diffuse coronary artery  disease Aortic Atherosclerosis (ICD10-I70.0). Electronically Signed   By: Rolm Baptise M.D.   On: 06/01/2018 16:29     Time spent: 35 minutes  Domenic Polite , MD [facility] Page via [email or web address] 06/02/2018 10:50 AM  Between 7PM-7AM, please contact night-coverage at [email or web address], password [email or web address]

## 2018-06-02 NOTE — Progress Notes (Signed)
INFECTIOUS DISEASE PROGRESS NOTE  ID: Ricardo Watkins is a 84 y.o. male with  Principal Problem:   HCAP (healthcare-associated pneumonia) Active Problems:   CAD (coronary artery disease)   Essential hypertension   Chronic renal failure, stage 3 (moderate) (HCC)   MDS (myelodysplastic syndrome) with 5q deletion (HCC)   Sepsis (HCC)   Type 2 diabetes mellitus (HCC)   CHF (congestive heart failure) (HCC)   Febrile illness, acute   Goals of care, counseling/discussion   Bacteremia   Palliative care encounter   Sepsis due to Enterococcus [facility])  Subjective: Dyspneic, does not feel well.   Abtx:  Anti-infectives (From admission, onward)   Start     Dose/Rate Route Frequency Ordered Stop   05/31/18 1000  Ampicillin-Sulbactam (UNASYN) 3 g in sodium chloride 0.9 % 100 mL IVPB     3 g 200 mL/hr over 30 Minutes Intravenous Every 12 hours 05/31/18 0841     05/30/18 1500  DAPTOmycin (CUBICIN) 670 mg in sodium chloride 0.9 % IVPB  Status:  Discontinued     670 mg 226.8 mL/hr over 30 Minutes Intravenous Every 48 hours 05/30/18 1327 05/31/18 0835   05/29/18 1400  Ampicillin-Sulbactam (UNASYN) 3 g in sodium chloride 0.9 % 100 mL IVPB  Status:  Discontinued     3 g 200 mL/hr over 30 Minutes Intravenous Every 12 hours 05/29/18 1059 05/30/18 1326   05/29/18 1000  vancomycin (VANCOCIN) IVPB 750 mg/150 ml premix  Status:  Discontinued     750 mg 150 mL/hr over 60 Minutes Intravenous Every 24 hours 05/28/18 1004 05/29/18 1052   05/28/18 2200  ceFEPIme (MAXIPIME) 1 g in sodium chloride 0.9 % 100 mL IVPB  Status:  Discontinued     1 g 200 mL/hr over 30 Minutes Intravenous Every 12 hours 05/28/18 1004 05/29/18 1052   05/28/18 0845  ceFEPIme (MAXIPIME) 2 g in sodium chloride 0.9 % 100 mL IVPB     2 g 200 mL/hr over 30 Minutes Intravenous  Once 05/28/18 0834 05/28/18 0931   05/28/18 0845  vancomycin (VANCOCIN) IVPB 1000 mg/200 mL premix  Status:  Discontinued     1,000 mg 200 mL/hr over 60 Minutes  Intravenous  Once 05/28/18 0834 05/28/18 0839   05/28/18 0845  vancomycin (VANCOCIN) 1,500 mg in sodium chloride 0.9 % 500 mL IVPB     1,500 mg 250 mL/hr over 120 Minutes Intravenous  Once 05/28/18 0839 05/28/18 1156      Medications:  Scheduled: . sodium chloride   Intravenous Once  . sodium chloride   Intravenous Once  . aspirin EC  81 mg Oral Daily  . clopidogrel  75 mg Oral Daily  . gabapentin  100 mg Oral QHS  . insulin aspart  0-5 Units Subcutaneous QHS  . insulin aspart  0-9 Units Subcutaneous TID WC  . insulin glargine  20 Units Subcutaneous q morning - 10a  . isosorbide mononitrate  60 mg Oral Daily  . metoprolol succinate  100 mg Oral Daily  . multivitamin with minerals  1 tablet Oral q morning - 10a  . naphazoline-pheniramine  1 drop Both Eyes Daily  . polyethylene glycol  17 g Oral Daily  . pravastatin  40 mg Oral q1800  . senna-docusate  1 tablet Oral BID  . tamsulosin  0.4 mg Oral Daily    Objective: Vital signs in last 24 hours: Temp:  [97.8 F (36.6 C)-99.8 F (37.7 C)] 97.9 F (36.6 C) (12/13 0757) Pulse Rate:  [92-107]  107 (12/13 0757) Resp:  [16] 16 (12/12 2324) BP: (107-136)/(54-71) 136/71 (12/13 0757) SpO2:  [93 %-100 %] 93 % (12/13 0757)   General appearance: alert, cooperative and mild distress Resp: rhonchi anterior - bilateral Cardio: systolic murmur: systolic ejection 3/6, crescendo at 2nd left intercostal space and . GI: normal findings: bowel sounds normal and soft, non-tender  Lab Results Recent Labs    06/01/18 0253 06/02/18 0254  WBC 7.2 5.2  HGB 8.4* 7.2*  HCT 28.0* 24.5*  NA 140 139  K 3.6 3.6  CL 105 105  CO2 24 24  BUN 47* 50*  CREATININE 2.29* 2.20*   Liver Panel Recent Labs    05/31/18 0306  PROT 7.3  ALBUMIN 2.3*  AST 20  ALT 19  ALKPHOS 55  BILITOT 1.0   Sedimentation Rate No results for input(s): ESRSEDRATE in the last 72 hours. C-Reactive Protein No results for input(s): CRP in the last 72  hours.  Microbiology: Recent Results (from the past 240 hour(s))  Blood Culture (routine x 2)     Status: None (Preliminary result)   Collection Time: 05/28/18  8:34 AM  Result Value Ref Range Status   Specimen Description BLOOD BLOOD RIGHT FOREARM  Final   Special Requests   Final    BOTTLES DRAWN AEROBIC AND ANAEROBIC Blood Culture adequate volume   Culture   Final    NO GROWTH 4 DAYS Performed at [facility], [address] [place], [place] [address]    Report Status PENDING  Incomplete  Urine culture     Status: Abnormal   Collection Time: 05/28/18  8:34 AM  Result Value Ref Range Status   Specimen Description URINE, RANDOM  Final   Special Requests NONE  Final   Culture (A)  Final    <10,000 COLONIES/mL INSIGNIFICANT GROWTH Performed at [facility] [address] [place], [place] [address]    Report Status 05/29/2018 FINAL  Final  Blood Culture (routine x 2)     Status: Abnormal   Collection Time: 05/28/18  8:39 AM  Result Value Ref Range Status   Specimen Description BLOOD RIGHT ANTECUBITAL  Final   Special Requests   Final    BOTTLES DRAWN AEROBIC AND ANAEROBIC Blood Culture adequate volume   Culture  Setup Time   Final    GRAM POSITIVE COCCI AEROBIC BOTTLE ONLY CRITICAL RESULT CALLED TO, READ BACK BY AND VERIFIED WITH: Salli Real [id number] Performed at [facility], [place] [address], [place], [place] [address]    Culture ENTEROCOCCUS FAECIUM (A)  Final   Report Status 05/31/2018 FINAL  Final   Organism ID, Bacteria ENTEROCOCCUS FAECIUM  Final      Susceptibility   Enterococcus faecium - MIC*    AMPICILLIN <=2 SENSITIVE Sensitive     VANCOMYCIN 1 SENSITIVE Sensitive     GENTAMICIN SYNERGY SENSITIVE Sensitive     * ENTEROCOCCUS FAECIUM  Blood Culture ID Panel (Reflexed)     Status: Abnormal   Collection Time: 05/28/18  8:39 AM  Result Value Ref Range Status   Enterococcus species DETECTED (A) NOT DETECTED Final    Comment:  CRITICAL RESULT CALLED TO, READ BACK BY AND VERIFIED WITH: PHARMD G ABBOTT [id number] MLM    Vancomycin resistance NOT DETECTED NOT DETECTED Final   Listeria monocytogenes NOT DETECTED NOT DETECTED Final   Staphylococcus species NOT DETECTED NOT DETECTED Final   Staphylococcus aureus (BCID) NOT DETECTED NOT DETECTED Final  Streptococcus species NOT DETECTED NOT DETECTED Final   Streptococcus agalactiae NOT DETECTED NOT DETECTED Final   Streptococcus pneumoniae NOT DETECTED NOT DETECTED Final   Streptococcus pyogenes NOT DETECTED NOT DETECTED Final   Acinetobacter baumannii NOT DETECTED NOT DETECTED Final   Enterobacteriaceae species NOT DETECTED NOT DETECTED Final   Enterobacter cloacae complex NOT DETECTED NOT DETECTED Final   Escherichia coli NOT DETECTED NOT DETECTED Final   Klebsiella oxytoca NOT DETECTED NOT DETECTED Final   Klebsiella pneumoniae NOT DETECTED NOT DETECTED Final   Proteus species NOT DETECTED NOT DETECTED Final   Serratia marcescens NOT DETECTED NOT DETECTED Final   Haemophilus influenzae NOT DETECTED NOT DETECTED Final   Neisseria meningitidis NOT DETECTED NOT DETECTED Final   Pseudomonas aeruginosa NOT DETECTED NOT DETECTED Final   Candida albicans NOT DETECTED NOT DETECTED Final   Candida glabrata NOT DETECTED NOT DETECTED Final   Candida krusei NOT DETECTED NOT DETECTED Final   Candida parapsilosis NOT DETECTED NOT DETECTED Final   Candida tropicalis NOT DETECTED NOT DETECTED Final    Comment: Performed at [facility], [place] [address], [place], [place] [address]  Respiratory Panel by PCR     Status: None   Collection Time: 05/28/18 12:38 PM  Result Value Ref Range Status   Adenovirus NOT DETECTED NOT DETECTED Final   Coronavirus 229E NOT DETECTED NOT DETECTED Final   Coronavirus HKU1 NOT DETECTED NOT DETECTED Final   Coronavirus NL63 NOT DETECTED NOT DETECTED Final   Coronavirus OC43 NOT DETECTED NOT DETECTED Final   Metapneumovirus NOT DETECTED  NOT DETECTED Final   Rhinovirus / Enterovirus NOT DETECTED NOT DETECTED Final   Influenza A NOT DETECTED NOT DETECTED Final   Influenza B NOT DETECTED NOT DETECTED Final   Parainfluenza Virus 1 NOT DETECTED NOT DETECTED Final   Parainfluenza Virus 2 NOT DETECTED NOT DETECTED Final   Parainfluenza Virus 3 NOT DETECTED NOT DETECTED Final   Parainfluenza Virus 4 NOT DETECTED NOT DETECTED Final   Respiratory Syncytial Virus NOT DETECTED NOT DETECTED Final   Bordetella pertussis NOT DETECTED NOT DETECTED Final   Chlamydophila pneumoniae NOT DETECTED NOT DETECTED Final   Mycoplasma pneumoniae NOT DETECTED NOT DETECTED Final  Culture, blood (Routine X 2) w Reflex to ID Panel     Status: None (Preliminary result)   Collection Time: 05/29/18 11:40 AM  Result Value Ref Range Status   Specimen Description BLOOD RIGHT ANTECUBITAL  Final   Special Requests   Final    BOTTLES DRAWN AEROBIC ONLY Blood Culture adequate volume   Culture   Final    NO GROWTH 3 DAYS Performed at [facility], [address], [place], [place] [address]    Report Status PENDING  Incomplete  Culture, blood (Routine X 2) w Reflex to ID Panel     Status: None (Preliminary result)   Collection Time: 05/30/18  8:05 PM  Result Value Ref Range Status   Specimen Description BLOOD RIGHT HAND  Final   Special Requests   Final    BOTTLES DRAWN AEROBIC ONLY Blood Culture adequate volume   Culture   Final    NO GROWTH 2 DAYS Performed at [facility], [place] [address], [place], [place] [address]    Report Status PENDING  Incomplete  Culture, blood (Routine X 2) w Reflex to ID Panel     Status: None (Preliminary result)   Collection Time: 05/30/18  8:10 PM  Result Value Ref Range Status  Specimen Description BLOOD RIGHT HAND  Final   Special Requests   Final    BOTTLES DRAWN AEROBIC ONLY Blood Culture adequate volume   Culture   Final    NO GROWTH 2 DAYS Performed at [facility], [address]  [place], [place] [address]    Report Status PENDING  Incomplete    Studies/Results: Ct Abdomen Pelvis Wo Contrast  Result Date: 05/31/2018 CLINICAL DATA:  Bacteremia. EXAM: CT ABDOMEN AND PELVIS WITHOUT CONTRAST TECHNIQUE: Multidetector CT imaging of the abdomen and pelvis was performed following the standard protocol without IV contrast. COMPARISON:  CT scan of April 14, 2018. FINDINGS: Lower chest: Mild right pleural effusion is noted with adjacent subsegmental atelectasis. Hepatobiliary: No focal liver abnormality is seen. No gallstones, gallbladder wall thickening, or biliary dilatation. Pancreas: Unremarkable. No pancreatic ductal dilatation or surrounding inflammatory changes. Spleen: Normal in size without focal abnormality. Adrenals/Urinary Tract: Adrenal glands appear normal. Stable exophytic right renal cyst is noted. No hydronephrosis or renal obstruction is noted. No renal or ureteral calculi are noted. Urinary bladder is unremarkable. Stomach/Bowel: Stomach is within normal limits. Appendix appears normal. No evidence of bowel wall thickening, distention, or inflammatory changes. Sigmoid diverticulosis is noted without inflammation. Vascular/Lymphatic: Aortic atherosclerosis. No enlarged abdominal or pelvic lymph nodes. Reproductive: Status post prostatic brachytherapy seed placement. Other: Large fat containing right inguinal hernia is noted which extends into the scrotum. This is stable compared to prior exam. Musculoskeletal: No acute or significant osseous findings. IMPRESSION: Mild right pleural effusion is noted with adjacent subsegmental atelectasis. Sigmoid diverticulosis without inflammation. Status post prostatic brachytherapy seed placement. Stable large fat containing right inguinal hernia is noted which extends into the scrotum. Aortic Atherosclerosis (ICD10-I70.0). Electronically Signed   By: Marijo Conception, M.D.   On: 05/31/2018 12:50   Dg Chest 2 View  Result Date:  05/31/2018 CLINICAL DATA:  New onset of fever. The patient has an upset stomach and is coughing. History of CHF, previous CVA, previous episodes of pneumonia, former smoker. EXAM: CHEST - 2 VIEW COMPARISON:  Portable chest x-ray of May 28, 2018 FINDINGS: There's been interval worsening in airspace opacity in the right upper lobe. The right lower lung is clear. The left lung is clear. The heart is top-normal in size. The pulmonary vascularity is not engorged. There is calcification in the wall of the aortic arch. IMPRESSION: Worsening airspace opacity in the right upper lobe compatible with pneumonia. No overt CHF. Thoracic aortic atherosclerosis. Electronically Signed   By: David  Martinique M.D.   On: 05/31/2018 12:09   Ct Chest Wo Contrast  Result Date: 06/01/2018 CLINICAL DATA:  Fever for 2 days. EXAM: CT CHEST WITHOUT CONTRAST TECHNIQUE: Multidetector CT imaging of the chest was performed following the standard protocol without IV contrast. COMPARISON:  Chest x-ray 05/31/2018 FINDINGS: Cardiovascular: Aortic atherosclerosis and diffuse coronary artery calcifications. Aorta is normal caliber. Heart is normal size. Mediastinum/Nodes: Mildly enlarged mediastinal lymph nodes. Subcarinal lymph node has a short axis diameter of 15 mm. No hilar or visible axillary adenopathy. Lungs/Pleura: Small left effusion and moderate right effusion. Rounded masslike opacity noted in the right upper lobe with surrounding ground-glass disease. Ground-glass disease throughout much of the right upper lobe. Compressive atelectasis in the right lower lobe. Spiculated nodule in the posterior left upper lobe measures 11 mm. Upper Abdomen: Imaging into the upper abdomen shows no acute findings. Musculoskeletal: Chest wall soft tissues are unremarkable. No acute bony abnormality. IMPRESSION: Rounded dense masslike consolidation in the right  upper lobe with surrounding ground-glass disease throughout much of the right upper lobe. This  could reflect large pulmonary mass/lung cancer or rounded pneumonia. Spiculated 11 mm nodule in the left upper lobe. Can not exclude primary lung cancer or metastasis. Mild mediastinal adenopathy. Moderate right pleural effusion and small left pleural effusion. Diffuse coronary artery disease Aortic Atherosclerosis (ICD10-I70.0). Electronically Signed   By: Rolm Baptise M.D.   On: 06/01/2018 16:29     Assessment/Plan: Enterococcal bacteremia Multifocal PNA Mass in RUL, spiculated mass LUL  Total days of antibiotics: 5 unasyn     Repeat BCx 12-9 and 12-13 are negative so far.  Will plan for 14 day total of unasyn Await his "mass" work up.  No fever since 12-11 Available as needed .      Bobby Rumpf MD, FACP Infectious Diseases (pager) [phone] [email or web address] 06/02/2018, 8:39 AM  LOS: 5 days

## 2018-06-03 ENCOUNTER — Inpatient Hospital Stay (HOSPITAL_COMMUNITY): Payer: Medicare Other

## 2018-06-03 DIAGNOSIS — N183 Chronic kidney disease, stage 3 unspecified: Secondary | ICD-10-CM

## 2018-06-03 DIAGNOSIS — I5020 Unspecified systolic (congestive) heart failure: Secondary | ICD-10-CM

## 2018-06-03 LAB — PROTEIN, PLEURAL OR PERITONEAL FLUID: Total protein, fluid: <3 g/dL

## 2018-06-03 LAB — CULTURE, BLOOD (ROUTINE X 2)
Culture: NO GROWTH
Special Requests: ADEQUATE

## 2018-06-03 LAB — BODY FLUID CELL COUNT WITH DIFFERENTIAL
Eos, Fluid: 0 %
Lymphs, Fluid: 34 %
Monocyte-Macrophage-Serous Fluid: 66 % (ref 50–90)
NEUTROPHIL FLUID: 0 % (ref 0–25)
Other Cells, Fluid: 20 %
Total Nucleated Cell Count, Fluid: 577 cu mm (ref 0–1000)

## 2018-06-03 LAB — LACTATE DEHYDROGENASE, PLEURAL OR PERITONEAL FLUID: LD, Fluid: 269 U/L — ABNORMAL HIGH (ref 3–23)

## 2018-06-03 LAB — GLUCOSE, CAPILLARY
GLUCOSE-CAPILLARY: 181 mg/dL — AB (ref 70–99)
Glucose-Capillary: 110 mg/dL — ABNORMAL HIGH (ref 70–99)
Glucose-Capillary: 230 mg/dL — ABNORMAL HIGH (ref 70–99)
Glucose-Capillary: 220 mg/dL — ABNORMAL HIGH (ref 70–99)

## 2018-06-03 LAB — BASIC METABOLIC PANEL
Anion gap: 11 (ref 5–15)
BUN: 45 mg/dL — ABNORMAL HIGH (ref 8–23)
CHLORIDE: 107 mmol/L (ref 98–111)
CO2: 22 mmol/L (ref 22–32)
Calcium: 8.1 mg/dL — ABNORMAL LOW (ref 8.9–10.3)
Creatinine, Ser: 2.29 mg/dL — ABNORMAL HIGH (ref 0.61–1.24)
GFR calc Af Amer: 29 mL/min — ABNORMAL LOW (ref >60)
GFR calc non Af Amer: 25 mL/min — ABNORMAL LOW (ref >60)
Glucose, Bld: 127 mg/dL — ABNORMAL HIGH (ref 70–99)
Potassium: 3.8 mmol/L (ref 3.5–5.1)
Sodium: 140 mmol/L (ref 135–145)

## 2018-06-03 LAB — CBC
HCT: 27.5 % — ABNORMAL LOW (ref 39.0–52.0)
HEMOGLOBIN: 8.1 g/dL — AB (ref 13.0–17.0)
MCH: 26 pg (ref 26.0–34.0)
MCHC: 29.5 g/dL — ABNORMAL LOW (ref 30.0–36.0)
MCV: 88.4 fL (ref 80.0–100.0)
Platelets: 81 10*3/uL — ABNORMAL LOW (ref 150–400)
RBC: 3.11 MIL/uL — ABNORMAL LOW (ref 4.22–5.81)
RDW: 19.2 % — ABNORMAL HIGH (ref 11.5–15.5)
WBC: 4.4 10*3/uL (ref 4.0–10.5)
nRBC: 0 % (ref 0.0–0.2)

## 2018-06-03 LAB — BPAM RBC
Blood Product Expiration Date: 202001042359
ISSUE DATE / TIME: 201912132129
Unit Type and Rh: 5100

## 2018-06-03 LAB — TYPE AND SCREEN
ABO/RH(D): O POS
Antibody Screen: NEGATIVE
Unit division: 0

## 2018-06-03 MED ORDER — POTASSIUM CHLORIDE CRYS ER 20 MEQ PO TBCR
40.0000 meq | EXTENDED_RELEASE_TABLET | Freq: Once | ORAL | Status: AC
  Administered 2018-06-03: 40 meq via ORAL
  Filled 2018-06-03: qty 2

## 2018-06-03 MED ORDER — IPRATROPIUM-ALBUTEROL 0.5-2.5 (3) MG/3ML IN SOLN
3.0000 mL | RESPIRATORY_TRACT | Status: DC | PRN
  Administered 2018-06-03 – 2018-06-07 (×3): 3 mL via RESPIRATORY_TRACT
  Filled 2018-06-03: qty 3

## 2018-06-03 MED ORDER — FUROSEMIDE 10 MG/ML IJ SOLN
40.0000 mg | Freq: Once | INTRAMUSCULAR | Status: AC
  Administered 2018-06-03: 40 mg via INTRAVENOUS
  Filled 2018-06-03: qty 4

## 2018-06-03 MED ORDER — ACETAMINOPHEN 650 MG RE SUPP
650.0000 mg | Freq: Once | RECTAL | Status: AC
  Administered 2018-06-03: 650 mg via RECTAL
  Filled 2018-06-03: qty 1

## 2018-06-03 MED ORDER — FUROSEMIDE 10 MG/ML IJ SOLN
60.0000 mg | Freq: Once | INTRAMUSCULAR | Status: AC
  Administered 2018-06-03: 60 mg via INTRAVENOUS
  Filled 2018-06-03: qty 6

## 2018-06-03 MED ORDER — IPRATROPIUM-ALBUTEROL 0.5-2.5 (3) MG/3ML IN SOLN
RESPIRATORY_TRACT | Status: AC
  Filled 2018-06-03: qty 3

## 2018-06-03 MED ORDER — LIDOCAINE-EPINEPHRINE 1 %-1:100000 IJ SOLN
INTRAMUSCULAR | Status: AC
  Filled 2018-06-03: qty 1

## 2018-06-03 NOTE — Progress Notes (Signed)
[facility] Progress Note  Patient: Ricardo Watkins JME:[id number]   PCP: [name], MD DOB: [birth year]   DOA: 05/28/2018   DOS: 06/03/2018   Date of Service: the patient was seen and examined on 06/03/2018  Brief hospital course: Pt. with PMH of PVD s/p R renal artery stent; remote prostate CA s/p seed radiation; NHL; HTN; HLD; DM; chronic ITP; CHF; and CAD s/p stent(recentlydeemed not an interventional candidate)h/o 4 admission in 12 months and 13 ED visits in 12 months; admitted on 05/28/2018, presented with complaint of chills and chest pain, was found to have pneumonia and enterococcus bacteremia.  S/P 2 PRBC on 05/29/2018 -ID following, now on IV Unasyn -CT chest with rounded dense masslike opacity in the right upper lobe, clearing rounded pneumonia versus mass/cancer and a spiculated nodule in the left upper lobe as well -Just underwent thoracentesis this morning 12/14, continues to be frequently transfusion dependent due to advanced MDS  Subjective:   -Was more short of breath overnight, feels better after thoracentesis this morning -Low-grade fever immediately following blood transfusion  Assessment and Plan: 1.  Sepsis/enterococcal bacteremia -Presumed to be secondary to healthcare associated pneumonia, present on admission -Repeat chest x-ray 12/11 with worsening opacity in the right upper lobe consistent with pneumonia -Blood cultures grew enterococcus faecium -Antibiotics transitioned back to Unasyn per infectious disease 12/11 -Appreciate ID input, 2-week course of Unasyn recommended until 12/23 -Repeat blood cultures negative thus far,  -CT abdomen pelvis was benign, CT chest as noted above -suspect his fever last night post transfusion related,  2.    Moderate right pleural effusion -Likely associated with right upper lobe pneumonia vs mass -Plan for ultrasound-guided thoracentesis today, unfortunately did not get done yesterday follow-up cell count LDH  protein culture and cytology   3. Myelodysplastic syndrome with 5 q. Deletion. -With symptomatic anemia, chronic neutropenia and thrombocytopenia -Transfused 2 units of PRBC for hemoglobin of 6.9 on 12/9, hemoglobin and platelet count drifting down again, supportive care -Given yet another unit of PRBC yesterday on 12/13 -He is also on Nplate and erythropoietin -Followed by oncology [name]  3.  CAD/chronic systolic and diastolic CHF -Echocardiogram in 9/2 0 19 showed EF of 30 to 22% with diastolic dysfunction -Repeat echo now showed improvement in EF to 50%, no overt vegetations -Given IV Lasix last night, give another dose today -Continue aspirin, Plavix, Toprol, Imdur  4.  Acute kidney injury on chronic kidney disease stage IV. Baseline renal function around 1.8-1.7 -Creatinine was 2.4 on admission, trended down to 2.0, back up to 2.2 with worsening BUN as well -likely due to ongoing sepsis -Diuretics were held in the setting of sepsis and high fevers -Give Lasix x1 today  5. Type 2 Diabetes Mellitus, uncontroled with renal complication and neuropathic complication -Lantus and sliding scale -Hb A1c is 7.7  6.  Goals of care discussion. -Multiple frequent hospitalizations -Seen by palliative care for goals of care discussion, Patient is DNR/DNI -Prognosis appears poor overall, discussed this with daughter at bedside   Diet: regular diet thin DVT Prophylaxis: mechanical compression device  Advance goals of care discussion: DNR DNI  Family Communication: Daughter at bedside yesterday  Disposition: To be determined  Consultants: ID, Palliative care  Procedures: Echocardiogram   Scheduled Meds: . sodium chloride   Intravenous Once  . aspirin EC  81 mg Oral Daily  . clopidogrel  75 mg Oral Daily  . gabapentin  100 mg Oral QHS  . insulin aspart  0-5 Units  Subcutaneous QHS  . insulin aspart  0-9 Units Subcutaneous TID WC  . insulin glargine  20 Units Subcutaneous q  morning - 10a  . ipratropium-albuterol      . isosorbide mononitrate  60 mg Oral Daily  . lidocaine-EPINEPHrine      . metoprolol succinate  100 mg Oral Daily  . multivitamin with minerals  1 tablet Oral q morning - 10a  . naphazoline-pheniramine  1 drop Both Eyes Daily  . polyethylene glycol  17 g Oral Daily  . pravastatin  40 mg Oral q1800  . senna-docusate  1 tablet Oral BID  . tamsulosin  0.4 mg Oral Daily   Continuous Infusions: . ampicillin-sulbactam (UNASYN) IV 3 g (06/03/18 0101)   PRN Meds: acetaminophen, ipratropium-albuterol, loperamide, meclizine, neomycin-bacitracin-polymyxin, nitroGLYCERIN, ondansetron (ZOFRAN) IV, traMADol Antibiotics: Anti-infectives (From admission, onward)   Start     Dose/Rate Route Frequency Ordered Stop   05/31/18 1000  Ampicillin-Sulbactam (UNASYN) 3 g in sodium chloride 0.9 % 100 mL IVPB     3 g 200 mL/hr over 30 Minutes Intravenous Every 12 hours 05/31/18 0841 06/13/18 1159   05/30/18 1500  DAPTOmycin (CUBICIN) 670 mg in sodium chloride 0.9 % IVPB  Status:  Discontinued     670 mg 226.8 mL/hr over 30 Minutes Intravenous Every 48 hours 05/30/18 1327 05/31/18 0835   05/29/18 1400  Ampicillin-Sulbactam (UNASYN) 3 g in sodium chloride 0.9 % 100 mL IVPB  Status:  Discontinued     3 g 200 mL/hr over 30 Minutes Intravenous Every 12 hours 05/29/18 1059 05/30/18 1326   05/29/18 1000  vancomycin (VANCOCIN) IVPB 750 mg/150 ml premix  Status:  Discontinued     750 mg 150 mL/hr over 60 Minutes Intravenous Every 24 hours 05/28/18 1004 05/29/18 1052   05/28/18 2200  ceFEPIme (MAXIPIME) 1 g in sodium chloride 0.9 % 100 mL IVPB  Status:  Discontinued     1 g 200 mL/hr over 30 Minutes Intravenous Every 12 hours 05/28/18 1004 05/29/18 1052   05/28/18 0845  ceFEPIme (MAXIPIME) 2 g in sodium chloride 0.9 % 100 mL IVPB     2 g 200 mL/hr over 30 Minutes Intravenous  Once 05/28/18 0834 05/28/18 0931   05/28/18 0845  vancomycin (VANCOCIN) IVPB 1000 mg/200 mL  premix  Status:  Discontinued     1,000 mg 200 mL/hr over 60 Minutes Intravenous  Once 05/28/18 0834 05/28/18 0839   05/28/18 0845  vancomycin (VANCOCIN) 1,500 mg in sodium chloride 0.9 % 500 mL IVPB     1,500 mg 250 mL/hr over 120 Minutes Intravenous  Once 05/28/18 0839 05/28/18 1156       Objective: Physical Exam: Vitals:   06/03/18 0745 06/03/18 0807 06/03/18 0947 06/03/18 1015  BP:  (!) 112/55 (!) 125/55 126/77  Pulse: 71 87    Resp: 18 (!) 21    Temp:  97.9 F (36.6 C)    TempSrc:  Oral    SpO2: 97% 97%    Weight:      Height:        Intake/Output Summary (Last 24 hours) at 06/03/2018 1121 Last data filed at 06/03/2018 0900 Gross per 24 hour  Intake 750 ml  Output 1500 ml  Net -750 ml   Filed Weights   05/28/18 0826  Weight: 83.4 kg   Gen: Frail, chronically ill-appearing elderly male, oriented x2 HEENT: PERRLA, Neck supple, no JVD Lungs: Decreased breath sounds at both bases, worse on the right  CVS: RRR,No Gallops,Rubs or  new Murmurs Abd: soft, Non tender, non distended, BS present Extremities: Trace edema Skin: no new rashes  Data Reviewed: CBC: Recent Labs  Lab 05/28/18 0839 05/29/18 0539  05/30/18 0831 05/31/18 0735 06/01/18 0253 06/02/18 0254 06/03/18 0229  WBC 4.8 3.8*   < > 4.7 8.6 7.2 5.2 4.4  NEUTROABS 1.4* 1.2*  --  2.0 4.9  --   --   --   HGB 7.7* 6.5*   < > 9.0* 9.0* 8.4* 7.2* 8.1*  HCT 27.0* 22.0*   < > 28.9* 28.1* 28.0* 24.5* 27.5*  MCV 85.2 84.3   < > 84.5 80.3 86.4 86.9 88.4  PLT 144* PLATELET CLUMPS NOTED ON SMEAR, UNABLE TO ESTIMATE   < > 112* 115* 93* 95* 81*   < > = values in this interval not displayed.   Basic Metabolic Panel: Recent Labs  Lab 05/30/18 0645 05/31/18 0306 06/01/18 0253 06/02/18 0254 06/03/18 0229  NA 143 140 140 139 140  K 3.9 4.0 3.6 3.6 3.8  CL 106 107 105 105 107  CO2 26 20* 24 24 22   GLUCOSE 161* 188* 168* 132* 127*  BUN 40* 38* 47* 50* 45*  CREATININE 2.28* 2.09* 2.29* 2.20* 2.29*  CALCIUM  8.6* 8.4* 8.4* 8.0* 8.1*  MG  --  2.0  --   --   --     Liver Function Tests: Recent Labs  Lab 05/28/18 0839 05/31/18 0306 06/02/18 0254  AST 15 20  --   ALT 16 19  --   ALKPHOS 71 55  --   BILITOT 0.7 1.0  --   PROT 7.9 7.3 6.3*  ALBUMIN 2.9* 2.3*  --    No results for input(s): LIPASE, AMYLASE in the last 168 hours. No results for input(s): AMMONIA in the last 168 hours. Coagulation Profile: No results for input(s): INR, PROTIME in the last 168 hours. Cardiac Enzymes: Recent Labs  Lab 05/31/18 0306  CKTOTAL 22*   BNP (last 3 results) No results for input(s): PROBNP in the last 8760 hours. CBG: Recent Labs  Lab 06/02/18 0833 06/02/18 1154 06/02/18 1759 06/02/18 2158 06/03/18 0808  GLUCAP 134* 152* 151* 109* 110*   Studies: Dg Chest 1 View  Result Date: 06/03/2018 CLINICAL DATA:  Post right-sided thoracentesis EXAM: CHEST  1 VIEW COMPARISON:  05/31/2018; chest CT-06/01/2018 FINDINGS: Grossly unchanged cardiac silhouette and mediastinal contours with unchanged appearance of mixed heterogeneous and consolidative right upper lobe mass with partial obscuration of the right paratracheal stripe. Unchanged appearance of known left upper lobe spiculated nodule. Pulmonary venous congestion without frank evidence of edema. Interval reduction/resolution of right-sided pleural effusion post thoracentesis. No pneumothorax. Unchanged trace left-sided pleural effusion. No acute osseous abnormalities. IMPRESSION: 1. Interval reduction/resolution of right-sided pleural effusion post thoracentesis. No pneumothorax. 2. Otherwise similar appearance of the chest including dominant consolidative/heterogeneous right upper lobe mass again worrisome for infection versus malignancy. 3. Unchanged slightly spiculated left upper lobe pulmonary nodule. 4. Pulmonary venous congestion without frank evidence of edema. Electronically Signed   By: Sandi Mariscal M.D.   On: 06/03/2018 10:45   US Thoracentesis  Asp Pleural Space W/img Guide  Result Date: 06/03/2018 INDICATION: Symptomatic right sided pleural effusion. Please perform ultrasound-guided thoracentesis for therapeutic and diagnostic purposes. EXAM: US THORACENTESIS ASP PLEURAL SPACE W/IMG GUIDE COMPARISON:  Chest CT-06/01/2018 MEDICATIONS: None. COMPLICATIONS: None immediate. TECHNIQUE: Informed written consent was obtained from the patient after a discussion of the risks, benefits and alternatives to treatment. A timeout was performed prior to  the initiation of the procedure. Initial ultrasound scanning demonstrates a small anechoic right-sided pleural effusion. The lower chest was prepped and draped in the usual sterile fashion. 1% lidocaine was used for local anesthesia. Under direct ultrasound guidance, a 19 gauge, 7-cm, Yueh catheter was introduced. An ultrasound image was saved for documentation purposes. The thoracentesis was performed. The catheter was removed and a dressing was applied. The patient tolerated the procedure well without immediate post procedural complication. The patient was escorted to have an upright chest radiograph. FINDINGS: A total of approximately 650 cc of serous fluid was removed. Requested samples were sent to the laboratory. IMPRESSION: Successful ultrasound-guided right sided thoracentesis yielding 650 cc of pleural fluid. Electronically Signed   By: Sandi Mariscal M.D.   On: 06/03/2018 10:46     Time spent: 35 minutes  Domenic Polite , MD [facility] Page via [email or web address] 06/03/2018 11:21 AM  Between 7PM-7AM, please contact night-coverage at [email or web address], password [facility]

## 2018-06-03 NOTE — Procedures (Signed)
Pre procedural Dx: Symptomatic Pleural effusion Post procedural Dx: Same  Successful US guided right sided thoracentesis yielding 650 cc of serous pleural fluid.   Samples sent to lab for analysis.  EBL: None  Complications: None immediate.  Jay Dameisha Tschida, MD Pager #: [phone]   

## 2018-06-03 NOTE — Progress Notes (Addendum)
Daily Progress Note   Patient Name: Ricardo Watkins       Date: 06/03/2018 DOB: 15-Nov-1930  Age: 84 y.o. MRN#: [id number] Attending Physician: Ricardo Polite, MD Primary Care Physician: Ricardo Frizzle, MD Admit Date: 05/28/2018  Reason for Consultation/Follow-up: Establishing goals of care  Subjective: Patient reports feeling better after transfusion and thoracentesis.  He also appreciates the "regular" diet.  Per RN he is eating well.  He reports he feels better after getting up and moving around a little bit with PT/OT yesterday.  Patient is focused on going home.  Family questions todays numbers - hgb and platelets.  They have a great deal of trust in Dr.  Alvy Watkins.   Assessment: 84 yo male with MDS has required 3 units of PRBCs and two thoracentesis this admission.  He has enterococcus faecium bacteremia.  Recurrent pleural effusions seem to be associated with blood transfusion (CKD and CHF).  Further CT chest 12/12 shows spiculated 11 mm nodule in LUL and rounded density in RUL (cancer vs rounded pneumonia).  These are both new since his previous CT in 08/2017.   Patient Profile/HPI:    84 y.o. male  with past medical history of MDS, CKD3, HF with an EF of 30-35%, PVD s/p renal stenting, prostate cancer s/p radiation, and CAD no appropriate for invasive therapies - who was admitted on 05/28/2018 with chest pain and a productive cough.  He was found to have bacteremia and pneumonia.  Ricardo Watkins has had 13 ED visits in the last year and 4 hospital admissions in 3 months.    Length of Stay: 6  Current Medications: Scheduled Meds:  . sodium chloride   Intravenous Once  . aspirin EC  81 mg Oral Daily  . clopidogrel  75 mg Oral Daily  . gabapentin  100 mg Oral QHS  . insulin aspart  0-5  Units Subcutaneous QHS  . insulin aspart  0-9 Units Subcutaneous TID WC  . insulin glargine  20 Units Subcutaneous q morning - 10a  . ipratropium-albuterol      . isosorbide mononitrate  60 mg Oral Daily  . lidocaine-EPINEPHrine      . metoprolol succinate  100 mg Oral Daily  . multivitamin with minerals  1 tablet Oral q morning - 10a  . naphazoline-pheniramine  1 drop Both  Eyes Daily  . polyethylene glycol  17 g Oral Daily  . pravastatin  40 mg Oral q1800  . senna-docusate  1 tablet Oral BID  . tamsulosin  0.4 mg Oral Daily    Continuous Infusions: . ampicillin-sulbactam (UNASYN) IV 3 g (06/03/18 1230)    PRN Meds: acetaminophen, ipratropium-albuterol, loperamide, meclizine, neomycin-bacitracin-polymyxin, nitroGLYCERIN, ondansetron (ZOFRAN) IV, traMADol  Physical Exam       Well developed frail appearing gentleman, appears chronically ill, multiple family members at bedside Tachycardic with systolic murmur Respiratory no distress, no distinct wheezes or crackles Abdomen soft tender nondistended   Vital Signs: BP (!) 130/59 (BP Location: Left Arm)   Pulse 93   Temp 98.3 F (36.8 C) (Oral)   Resp 20   Ht 5\' 9"  (1.753 m)   Wt 83.4 kg   SpO2 100%   BMI 27.15 kg/m  SpO2: SpO2: 100 % O2 Device: O2 Device: Nasal Cannula O2 Flow Rate: O2 Flow Rate (L/min): 2 L/min  Intake/output summary:   Intake/Output Summary (Last 24 hours) at 06/03/2018 1305 Last data filed at 06/03/2018 0900 Gross per 24 hour  Intake 630 ml  Output 1500 ml  Net -870 ml   LBM: Last BM Date: 06/02/18 Baseline Weight: Weight: 83.4 kg Most recent weight: Weight: 83.4 kg       Palliative Assessment/Data: 40%    Flowsheet Rows     Most Recent Value  Intake Tab  Referral Department  Hospitalist  Unit at Time of Referral  ER  Palliative Care Primary Diagnosis  Cardiac  Date Notified  05/28/18  Palliative Care Type  New Palliative care  Reason for referral  End of Life Care Assistance  Date  of Admission  05/28/18  # of days IP prior to Palliative referral  0  Clinical Assessment  Psychosocial & Spiritual Assessment  Palliative Care Outcomes      Patient Active Problem List   Diagnosis Date Noted  . Sepsis due to Enterococcus ([place])   . Bacteremia   . Palliative care encounter   . HCAP (healthcare-associated pneumonia) 05/28/2018  . Goals of care, counseling/discussion 05/28/2018  . Febrile illness, acute 05/01/2018  . Unstable angina ([place])   . Chronic systolic CHF (congestive heart failure) ([place])   . Acute on chronic combined systolic and diastolic CHF (congestive heart failure) ([place]) 04/22/2018  . Nonrheumatic aortic valve stenosis   . Chest pain in adult 04/20/2018  . Chronic neutropenia ([place]) 03/02/2018  . CHF (congestive heart failure) ([place]) 02/19/2018  . Upper airway cough syndrome 10/27/2017  . Abnormal CT of the chest 07/27/2017  . Flu-like symptoms 06/02/2017  . Elevated lactic acid level 06/02/2017  . Acute kidney injury superimposed on chronic kidney disease ([place]) 06/02/2017  . CKD (chronic kidney disease), stage IV ([place]) 12/16/2016  . Bilateral carotid artery stenosis 12/11/2016  . Dyslipidemia 12/11/2016  . Anemia in chronic kidney disease 07/01/2016  . Chronic ITP (idiopathic thrombocytopenia) ([facility]) 07/01/2016  . B12 deficiency anemia   . Chest tube in place   . Acute diastolic CHF (congestive heart failure) ([place]) 04/16/2016  . Acute respiratory failure with hypoxia ([place]) 04/14/2016  . Empyema lung ([place])   . Type 2 diabetes mellitus ([place]) 04/04/2016  . Empyema ([place]) 04/03/2016  . Thyroid nodule 03/03/2016  . Sepsis ([place]) 02/17/2016  . DOE (dyspnea on exertion) 01/27/2016  . Pulmonary edema with congestive heart failure ([place]) 11/12/2015  . Cough, persistent 11/04/2015  . Pleural effusion, left 11/04/2015  . Anorexia 08/08/2015  .  Microcytic anemia 07/07/2015  . Pancytopenia ([place]) 04/08/2015  . Diarrhea 04/08/2015  . Encounter for chemotherapy  management 04/02/2015  . Neutropenia ([place]) 02/18/2015  . MDS (myelodysplastic syndrome) with 5q deletion ([place]) 11/20/2014  . Deficiency anemia 04/19/2014  . Thrombocytopenia ([place]) 04/19/2014  . Vitamin B12 deficiency 04/19/2014  . Chronic renal failure, stage 3 (moderate) ([place]) 04/19/2014  . Macrocytic anemia 03/20/2013  . Monocytosis 03/20/2013  . Thrombotic stroke ([place]) 09/10/2011  . Prostate CA ([place]) 09/10/2011  . Inguinal hernia   . Colon polyps   . CAD (coronary artery disease) 08/26/2010  . Murmur 08/26/2010  . MURMUR 08/26/2010  . History of lymphoma 08/25/2010  . THROMBOCYTOPENIA 08/25/2010  . Essential hypertension 08/25/2010  . Coronary atherosclerosis 08/25/2010  . PVD 08/25/2010    Palliative Care Plan    Recommendations/Plan:  If appropriate please consider consulting Dr. Bartolo Darter CT chest results, recurrent infections, and the necessity for 3 units of PRBCs this admission.  The family would appreciate her excellent insight around goals of care.    The main difficulty in shifting to hospice is that he will not be eligible for blood transfusions.  He is hospice appropriate should he decide to forego blood transfusions.    PMT will continue to follow intermittently with you.  Goals of Care and Additional Recommendations:  Limitations on Scope of Treatment: Full Scope Treatment  Code Status:  DNR  Prognosis:   < 6 months given recurrent hospitalizations, recurrent infections, rapid decline, need for frequent blood transfusions and invasive therapies (thoracentesis).  Discharge Planning:  To be determined.  He is eligible for hospice services at home should the family desire them.  Otherwise he would benefit from being followed by palliative care outpatient  Care plan was discussed with Dr. Broadus John and family  Thank you for allowing the Palliative Medicine Team to assist in the care of this patient.  Total time spent: 35 minutes     Greater than  50%  of this time was spent counseling and coordinating care related to the above assessment and plan.  Florentina Jenny, PA-C Palliative Medicine  Please contact Palliative MedicineTeam phone at [phone] for questions and concerns between 7 am - 7 pm.   Please see AMION for individual provider pager numbers.

## 2018-06-04 LAB — URINALYSIS, ROUTINE W REFLEX MICROSCOPIC
Bilirubin Urine: NEGATIVE
Glucose, UA: NEGATIVE mg/dL
Ketones, ur: NEGATIVE mg/dL
Leukocytes, UA: NEGATIVE
Nitrite: NEGATIVE
Protein, ur: NEGATIVE mg/dL
Specific Gravity, Urine: 1.013 (ref 1.005–1.030)
pH: 5 (ref 5.0–8.0)

## 2018-06-04 LAB — CBC
HCT: 25.4 % — ABNORMAL LOW (ref 39.0–52.0)
Hemoglobin: 7.9 g/dL — ABNORMAL LOW (ref 13.0–17.0)
MCH: 27 pg (ref 26.0–34.0)
MCHC: 31.1 g/dL (ref 30.0–36.0)
MCV: 86.7 fL (ref 80.0–100.0)
Platelets: 78 10*3/uL — ABNORMAL LOW (ref 150–400)
RBC: 2.93 MIL/uL — ABNORMAL LOW (ref 4.22–5.81)
RDW: 19.9 % — AB (ref 11.5–15.5)
WBC: 5.5 10*3/uL (ref 4.0–10.5)
nRBC: 0.4 % — ABNORMAL HIGH (ref 0.0–0.2)

## 2018-06-04 LAB — GLUCOSE, CAPILLARY
Glucose-Capillary: 106 mg/dL — ABNORMAL HIGH (ref 70–99)
Glucose-Capillary: 153 mg/dL — ABNORMAL HIGH (ref 70–99)

## 2018-06-04 LAB — BASIC METABOLIC PANEL
ANION GAP: 12 (ref 5–15)
BUN: 47 mg/dL — ABNORMAL HIGH (ref 8–23)
CALCIUM: 7.8 mg/dL — AB (ref 8.9–10.3)
CO2: 24 mmol/L (ref 22–32)
Chloride: 104 mmol/L (ref 98–111)
Creatinine, Ser: 2.08 mg/dL — ABNORMAL HIGH (ref 0.61–1.24)
GFR calc Af Amer: 33 mL/min — ABNORMAL LOW (ref >60)
GFR calc non Af Amer: 28 mL/min — ABNORMAL LOW (ref >60)
Glucose, Bld: 121 mg/dL — ABNORMAL HIGH (ref 70–99)
Potassium: 3.7 mmol/L (ref 3.5–5.1)
Sodium: 140 mmol/L (ref 135–145)

## 2018-06-04 MED ORDER — METOPROLOL SUCCINATE ER 50 MG PO TB24
50.0000 mg | ORAL_TABLET | Freq: Every day | ORAL | Status: DC
  Administered 2018-06-05 – 2018-06-12 (×8): 50 mg via ORAL
  Filled 2018-06-04 (×8): qty 1

## 2018-06-04 MED ORDER — POTASSIUM CHLORIDE CRYS ER 20 MEQ PO TBCR
40.0000 meq | EXTENDED_RELEASE_TABLET | Freq: Two times a day (BID) | ORAL | Status: AC
  Administered 2018-06-04 (×2): 40 meq via ORAL
  Filled 2018-06-04 (×2): qty 2

## 2018-06-04 MED ORDER — FUROSEMIDE 10 MG/ML IJ SOLN
40.0000 mg | Freq: Two times a day (BID) | INTRAMUSCULAR | Status: DC
  Administered 2018-06-04: 40 mg via INTRAVENOUS
  Filled 2018-06-04: qty 4

## 2018-06-04 MED ORDER — ACETAMINOPHEN 325 MG PO TABS
650.0000 mg | ORAL_TABLET | Freq: Once | ORAL | Status: DC
  Filled 2018-06-04: qty 2

## 2018-06-04 NOTE — Progress Notes (Signed)
[facility] Progress Note  Patient: Ricardo Watkins ZOX:[id number]   PCP: [name], MD DOB: 12-06-1930   DOA: 05/28/2018   DOS: 06/04/2018   Date of Service: the patient was seen and examined on 06/04/2018  Brief hospital course: Pt. with PMH of PVD s/p R renal artery stent; remote prostate CA s/p seed radiation; NHL; HTN; HLD; DM; chronic ITP; CHF; and CAD s/p stent(recentlydeemed not an interventional candidate)h/o 4 admission in 12 months and 13 ED visits in 12 months; admitted on 05/28/2018, presented with complaint of chills and chest pain, was found to have pneumonia and enterococcus bacteremia.  S/P 2 PRBC on 05/29/2018 -ID following, now on IV Unasyn -CT chest with rounded dense masslike opacity in the right upper lobe, clearing rounded pneumonia versus mass/cancer and a spiculated nodule in the left upper lobe as well -Just underwent thoracentesis this morning 12/14, continues to be frequently transfusion dependent due to advanced MDS -12/14 underwent thoracentesis  Subjective:   -Breathing better -Quite weak overall  Assessment and Plan: 1.  Sepsis/enterococcal bacteremia -Presumed to be secondary to healthcare associated pneumonia, present on admission -Repeat x-ray 12/11 showed worsening opacity in the right upper lobe  -Blood cultures grew enterococcus faecium -Antibiotics transitioned back to Unasyn per infectious disease 12/11 -Appreciate ID input, 2-week course of Unasyn recommended until 12/23 -Repeat blood cultures negative thus far,  -CT abdomen pelvis was benign, CT chest as noted above -Finally appears to be stabilizing  2.    Moderate right pleural effusion -Likely associated with right upper lobe pneumonia vs mass -Underwent ultrasound guided thoracentesis yesterday 12/14 with 650 cc of fluid drained  -Fluid appears transudative -Follow-up cultures and cytology  -restarted diuretics  3. Myelodysplastic syndrome with 5 q. Deletion. -With  symptomatic anemia, chronic neutropenia and thrombocytopenia -Transfused 2 units of PRBC for hemoglobin of 6.9 on 12/9, and then another unit of PRBC on 12/13 -On supportive care, transfuse as needed -He is also on Nplate and erythropoietin -Followed by oncology [name]  3.  CAD/chronic systolic and diastolic CHF -Echocardiogram in 9/2 0 19 showed EF of 30 to 81% with diastolic dysfunction -Repeat echo now showed improvement in EF to 50%, no overt vegetations -Restart IV Lasix with potassium, monitor urine output and kidney function closely -Continue aspirin, Plavix, Toprol, Imdur  4.  Acute kidney injury on chronic kidney disease stage IV. Baseline renal function around 1.8-1.7 -Creatinine was 2.4 on admission, trended down to 2.0, back up to 2.2 with worsening BUN as well -likely due to ongoing sepsis -Diuretics were held in the setting of sepsis and high fevers -resume diuretics today  5. 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  Goals of care discussion. -Multiple frequent hospitalizations -Seen by palliative care for goals of care discussion, Patient is DNR/DNI -Prognosis appears poor overall, discussed this with daughter at bedside   Diet: regular diet thin DVT Prophylaxis: mechanical compression device  Advance goals of care discussion: DNR DNI  Family Communication: Granddaughter at bedside, discussed with daughter yesterday Disposition: To be determined  Consultants: ID, Palliative care  Procedures: 12/14 thoracentesis, 650 cc of serous pleural fluid drained  echocardiogram   Scheduled Meds: . sodium chloride   Intravenous Once  . aspirin EC  81 mg Oral Daily  . clopidogrel  75 mg Oral Daily  . furosemide  40 mg Intravenous Q12H  . gabapentin  100 mg Oral  QHS  . insulin aspart  0-5 Units Subcutaneous QHS  . insulin aspart  0-9 Units  Subcutaneous TID WC  . insulin glargine  20 Units Subcutaneous q morning - 10a  . isosorbide mononitrate  60 mg Oral Daily  . metoprolol succinate  100 mg Oral Daily  . multivitamin with minerals  1 tablet Oral q morning - 10a  . naphazoline-pheniramine  1 drop Both Eyes Daily  . polyethylene glycol  17 g Oral Daily  . potassium chloride  40 mEq Oral BID  . pravastatin  40 mg Oral q1800  . senna-docusate  1 tablet Oral BID  . tamsulosin  0.4 mg Oral Daily   Continuous Infusions: . ampicillin-sulbactam (UNASYN) IV Stopped (06/04/18 0813)   PRN Meds: acetaminophen, ipratropium-albuterol, loperamide, meclizine, neomycin-bacitracin-polymyxin, nitroGLYCERIN, ondansetron (ZOFRAN) IV, traMADol Antibiotics: Anti-infectives (From admission, onward)   Start     Dose/Rate Route Frequency Ordered Stop   05/31/18 1000  Ampicillin-Sulbactam (UNASYN) 3 g in sodium chloride 0.9 % 100 mL IVPB     3 g 200 mL/hr over 30 Minutes Intravenous Every 12 hours 05/31/18 0841 06/13/18 1159   05/30/18 1500  DAPTOmycin (CUBICIN) 670 mg in sodium chloride 0.9 % IVPB  Status:  Discontinued     670 mg 226.8 mL/hr over 30 Minutes Intravenous Every 48 hours 05/30/18 1327 05/31/18 0835   05/29/18 1400  Ampicillin-Sulbactam (UNASYN) 3 g in sodium chloride 0.9 % 100 mL IVPB  Status:  Discontinued     3 g 200 mL/hr over 30 Minutes Intravenous Every 12 hours 05/29/18 1059 05/30/18 1326   05/29/18 1000  vancomycin (VANCOCIN) IVPB 750 mg/150 ml premix  Status:  Discontinued     750 mg 150 mL/hr over 60 Minutes Intravenous Every 24 hours 05/28/18 1004 05/29/18 1052   05/28/18 2200  ceFEPIme (MAXIPIME) 1 g in sodium chloride 0.9 % 100 mL IVPB  Status:  Discontinued     1 g 200 mL/hr over 30 Minutes Intravenous Every 12 hours 05/28/18 1004 05/29/18 1052   05/28/18 0845  ceFEPIme (MAXIPIME) 2 g in sodium chloride 0.9 % 100 mL IVPB     2 g 200 mL/hr over 30 Minutes Intravenous  Once 05/28/18 0834 05/28/18 0931   05/28/18  0845  vancomycin (VANCOCIN) IVPB 1000 mg/200 mL premix  Status:  Discontinued     1,000 mg 200 mL/hr over 60 Minutes Intravenous  Once 05/28/18 0834 05/28/18 0839   05/28/18 0845  vancomycin (VANCOCIN) 1,500 mg in sodium chloride 0.9 % 500 mL IVPB     1,500 mg 250 mL/hr over 120 Minutes Intravenous  Once 05/28/18 0839 05/28/18 1156       Objective: Physical Exam: Vitals:   06/03/18 2040 06/04/18 0034 06/04/18 0426 06/04/18 0805  BP: 122/61 127/66 133/64 (!) 121/58  Pulse: 83 88 94 100  Resp: (!) 26 14 (!) 23 (!) 27  Temp: 99.1 F (37.3 C) 98.9 F (37.2 C) 99.1 F (37.3 C) 99.5 F (37.5 C)  TempSrc:   Oral Oral  SpO2: 98% 97% 96% 95%  Weight:      Height:        Intake/Output Summary (Last 24 hours) at 06/04/2018 1045 Last data filed at 06/04/2018 1043 Gross per 24 hour  Intake 40 ml  Output 450 ml  Net -410 ml   Filed Weights   05/28/18 0826  Weight: 83.4 kg   Gen: Frail cachectic elderly male, sitting up in bed, oriented x2 HEENT: PERRLA, Neck supple, no JVD Lungs:  Fine bibasilar crackles improved breath sounds at the right lung CVS: RRR,No Gallops,Rubs or new Murmurs Abd: soft, Non tender, non distended, BS present Extremities: Trace edema Skin: no new rashes  Data Reviewed: CBC: Recent Labs  Lab 05/29/18 0539  05/30/18 0831 05/31/18 0735 06/01/18 0253 06/02/18 0254 06/03/18 0229 06/04/18 0247  WBC 3.8*   < > 4.7 8.6 7.2 5.2 4.4 5.5  NEUTROABS 1.2*  --  2.0 4.9  --   --   --   --   HGB 6.5*   < > 9.0* 9.0* 8.4* 7.2* 8.1* 7.9*  HCT 22.0*   < > 28.9* 28.1* 28.0* 24.5* 27.5* 25.4*  MCV 84.3   < > 84.5 80.3 86.4 86.9 88.4 86.7  PLT PLATELET CLUMPS NOTED ON SMEAR, UNABLE TO ESTIMATE   < > 112* 115* 93* 95* 81* 78*   < > = values in this interval not displayed.   Basic Metabolic Panel: Recent Labs  Lab 05/31/18 0306 06/01/18 0253 06/02/18 0254 06/03/18 0229 06/04/18 0247  NA 140 140 139 140 140  K 4.0 3.6 3.6 3.8 3.7  CL 107 105 105 107 104  CO2  20* 24 24 22 24   GLUCOSE 188* 168* 132* 127* 121*  BUN 38* 47* 50* 45* 47*  CREATININE 2.09* 2.29* 2.20* 2.29* 2.08*  CALCIUM 8.4* 8.4* 8.0* 8.1* 7.8*  MG 2.0  --   --   --   --     Liver Function Tests: Recent Labs  Lab 05/31/18 0306 06/02/18 0254  AST 20  --   ALT 19  --   ALKPHOS 55  --   BILITOT 1.0  --   PROT 7.3 6.3*  ALBUMIN 2.3*  --    No results for input(s): LIPASE, AMYLASE in the last 168 hours. No results for input(s): AMMONIA in the last 168 hours. Coagulation Profile: No results for input(s): INR, PROTIME in the last 168 hours. Cardiac Enzymes: Recent Labs  Lab 05/31/18 0306  CKTOTAL 22*   BNP (last 3 results) No results for input(s): PROBNP in the last 8760 hours. CBG: Recent Labs  Lab 06/03/18 0808 06/03/18 1155 06/03/18 1732 06/03/18 2120 06/04/18 0802  GLUCAP 110* 230* 220* 181* 106*   Studies: No results found.   Time spent: 35 minutes  Domenic Polite , MD [facility] Page via [email or web address] 06/04/2018 10:45 AM  Between 7PM-7AM, please contact night-coverage at [email or web address], password [email or web address]

## 2018-06-04 NOTE — Progress Notes (Addendum)
Discussed current patient status with MD. Still spiking temps, profusely diaphoretic, increased drowsiness. On cooling blanket now. Will give Tylenol when able. Repeating blood cultures x3, obtaining UA and UC as well.  Will bladder scan and continue to monitor.  Also, concern for food pocketing. Will continue to monitor the need for Speech Therapy.  Kinnie Scales, RN 06/04/18 1:54 PM

## 2018-06-05 ENCOUNTER — Inpatient Hospital Stay (HOSPITAL_COMMUNITY): Payer: Medicare Other

## 2018-06-05 DIAGNOSIS — A4181 Sepsis due to Enterococcus: Principal | ICD-10-CM

## 2018-06-05 DIAGNOSIS — J189 Pneumonia, unspecified organism: Secondary | ICD-10-CM

## 2018-06-05 DIAGNOSIS — Z515 Encounter for palliative care: Secondary | ICD-10-CM

## 2018-06-05 DIAGNOSIS — Z7189 Other specified counseling: Secondary | ICD-10-CM

## 2018-06-05 DIAGNOSIS — D469 Myelodysplastic syndrome, unspecified: Secondary | ICD-10-CM

## 2018-06-05 LAB — CBC
HCT: 26.5 % — ABNORMAL LOW (ref 39.0–52.0)
Hemoglobin: 7.9 g/dL — ABNORMAL LOW (ref 13.0–17.0)
MCH: 26.3 pg (ref 26.0–34.0)
MCHC: 29.8 g/dL — ABNORMAL LOW (ref 30.0–36.0)
MCV: 88.3 fL (ref 80.0–100.0)
PLATELETS: 68 10*3/uL — AB (ref 150–400)
RBC: 3 MIL/uL — ABNORMAL LOW (ref 4.22–5.81)
RDW: 19.7 % — ABNORMAL HIGH (ref 11.5–15.5)
WBC: 5.4 10*3/uL (ref 4.0–10.5)
nRBC: 0 % (ref 0.0–0.2)

## 2018-06-05 LAB — GLUCOSE, CAPILLARY
GLUCOSE-CAPILLARY: 190 mg/dL — AB (ref 70–99)
Glucose-Capillary: 100 mg/dL — ABNORMAL HIGH (ref 70–99)
Glucose-Capillary: 140 mg/dL — ABNORMAL HIGH (ref 70–99)
Glucose-Capillary: 150 mg/dL — ABNORMAL HIGH (ref 70–99)
Glucose-Capillary: 182 mg/dL — ABNORMAL HIGH (ref 70–99)
Glucose-Capillary: 182 mg/dL — ABNORMAL HIGH (ref 70–99)
Glucose-Capillary: 223 mg/dL — ABNORMAL HIGH (ref 70–99)

## 2018-06-05 LAB — BASIC METABOLIC PANEL
Anion gap: 12 (ref 5–15)
BUN: 44 mg/dL — ABNORMAL HIGH (ref 8–23)
CO2: 25 mmol/L (ref 22–32)
Calcium: 8 mg/dL — ABNORMAL LOW (ref 8.9–10.3)
Chloride: 104 mmol/L (ref 98–111)
Creatinine, Ser: 2.09 mg/dL — ABNORMAL HIGH (ref 0.61–1.24)
GFR calc Af Amer: 32 mL/min — ABNORMAL LOW (ref >60)
GFR calc non Af Amer: 28 mL/min — ABNORMAL LOW (ref >60)
Glucose, Bld: 143 mg/dL — ABNORMAL HIGH (ref 70–99)
Potassium: 4.9 mmol/L (ref 3.5–5.1)
SODIUM: 141 mmol/L (ref 135–145)

## 2018-06-05 LAB — PH, BODY FLUID: pH, Body Fluid: 7.7

## 2018-06-05 MED ORDER — POTASSIUM CHLORIDE CRYS ER 20 MEQ PO TBCR
40.0000 meq | EXTENDED_RELEASE_TABLET | Freq: Two times a day (BID) | ORAL | Status: AC
  Administered 2018-06-05 (×2): 40 meq via ORAL
  Filled 2018-06-05 (×2): qty 2

## 2018-06-05 MED ORDER — FUROSEMIDE 10 MG/ML IJ SOLN
20.0000 mg | Freq: Once | INTRAMUSCULAR | Status: AC
  Administered 2018-06-05: 20 mg via INTRAVENOUS
  Filled 2018-06-05: qty 2

## 2018-06-05 NOTE — Progress Notes (Signed)
[facility] Progress Note  Patient: Ricardo Watkins UEA:[id number]   PCP: [name], MD DOB: July 09, 1930   DOA: 05/28/2018   DOS: 06/05/2018   Date of Service: the patient was seen and examined on 06/05/2018  Brief hospital course: Pt. with PMH of PVD s/p R renal artery stent; remote prostate CA s/p seed radiation; NHL; HTN; HLD; DM; chronic ITP; CHF; and CAD s/p stent(recentlydeemed not an interventional candidate)h/o 4 admission in 12 months and 13 ED visits in 12 months; admitted on 05/28/2018, presented with complaint of chills and chest pain, was found to have pneumonia and enterococcus bacteremia.  S/P 2 PRBC on 05/29/2018 -ID following, now on IV Unasyn -CT chest with rounded dense masslike opacity in the right upper lobe, clearing rounded pneumonia versus mass/cancer and a spiculated nodule in the left upper lobe as well -Just underwent thoracentesis this morning 12/14, continues to be frequently transfusion dependent due to advanced MDS -12/14 underwent thoracentesis -Was febrile again 12/15 into 12/16 a.m.  Subjective:   -Weak but feels a little better today, T-max of 102 last p.m., denies any worsening abdominal pain vomiting or diarrhea  Assessment and Plan: 1.  Sepsis/enterococcal bacteremia -Presumed to be secondary to healthcare associated pneumonia, present on admission -Repeat x-ray 12/11 showed worsening opacity in the right upper lobe  -Blood cultures grew enterococcus faecium -Antibiotics transitioned back to Unasyn per infectious disease 12/11 -Infectious disease was consulted recommended 2-week course of IV Unasyn until 12/23, repeat cultures are negative, CT chest with right upper lobe lung mass versus rounded pneumonia no acute abdominal findings  -Unfortunately febrile again 1215 into last p.m. , blood cultures repeated , urinalysis is benign , chest x-ray is stable  -Monitor clinically, I wonder if there could be a component of tumor fever   2.     Moderate right pleural effusion -Likely associated with right upper lobe pneumonia vs mass -Underwent thoracentesis in radiology 12/14 with 650 cc fluid drained appears transudate of, await cytology -Unfortunately cultures did not get sent will check with microbiology and see if we can add this on  3. Myelodysplastic syndrome with 5 q. Deletion. -With symptomatic anemia, chronic neutropenia and thrombocytopenia -Transfused 2 units of PRBC for hemoglobin of 6.9 on 12/9, and then another unit of PRBC on 12/13 -On supportive care, transfuse as needed -He is also on Nplate and erythropoietin -Prognosis appears quite poor at this time we will discuss with [name]  3.  CAD/chronic systolic and diastolic CHF -Echocardiogram in 9/2 0 19 showed EF of 30 to 47% with diastolic dysfunction -Repeat echo now showed improvement in EF to 50%, no overt vegetations -We will give another dose of IV Lasix today, monitor volume status closely -Continue aspirin, Plavix, Toprol, Imdur  4.  Acute kidney injury on chronic kidney disease stage IV. Baseline renal function around 1.8-1.7 -Creatinine was 2.4 on admission, trended down to 2.0, back up to 2.2 with worsening BUN as well -likely due to ongoing sepsis -Diuretics were held in the setting of sepsis and high fevers -Diuretics restarted, use as needed  5. 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  Goals of care discussion. -Multiple frequent hospitalizations -Seen by palliative care for goals of care discussion, Patient is DNR/DNI -Prognosis appears poor overall, discussed this with daughter at bedside, he would qualify for home hospice however would not be eligible for blood transfusions then  Diet: regular diet thin DVT Prophylaxis: mechanical compression device  Advance goals of care discussion: DNR DNI  Family Communication:  Granddaughter at bedside, discussed with daughter Disposition: To be determined  Consultants: ID, Palliative care  Procedures: 12/14 thoracentesis, 650 cc of serous pleural fluid drained  echocardiogram   Scheduled Meds: . sodium chloride   Intravenous Once  . acetaminophen  650 mg Oral Once  . aspirin EC  81 mg Oral Daily  . clopidogrel  75 mg Oral Daily  . furosemide  20 mg Intravenous Once  . gabapentin  100 mg Oral QHS  . insulin aspart  0-5 Units Subcutaneous QHS  . insulin aspart  0-9 Units Subcutaneous TID WC  . insulin glargine  20 Units Subcutaneous q morning - 10a  . metoprolol succinate  50 mg Oral Daily  . multivitamin with minerals  1 tablet Oral q morning - 10a  . naphazoline-pheniramine  1 drop Both Eyes Daily  . polyethylene glycol  17 g Oral Daily  . potassium chloride  40 mEq Oral BID  . pravastatin  40 mg Oral q1800  . senna-docusate  1 tablet Oral BID  . tamsulosin  0.4 mg Oral Daily   Continuous Infusions: . ampicillin-sulbactam (UNASYN) IV 3 g (06/04/18 2331)   PRN Meds: acetaminophen, ipratropium-albuterol, loperamide, neomycin-bacitracin-polymyxin, nitroGLYCERIN, ondansetron (ZOFRAN) IV, traMADol Antibiotics: Anti-infectives (From admission, onward)   Start     Dose/Rate Route Frequency Ordered Stop   05/31/18 1000  Ampicillin-Sulbactam (UNASYN) 3 g in sodium chloride 0.9 % 100 mL IVPB     3 g 200 mL/hr over 30 Minutes Intravenous Every 12 hours 05/31/18 0841 06/13/18 1159   05/30/18 1500  DAPTOmycin (CUBICIN) 670 mg in sodium chloride 0.9 % IVPB  Status:  Discontinued     670 mg 226.8 mL/hr over 30 Minutes Intravenous Every 48 hours 05/30/18 1327 05/31/18 0835   05/29/18 1400  Ampicillin-Sulbactam (UNASYN) 3 g in sodium chloride 0.9 % 100 mL IVPB  Status:  Discontinued     3 g 200 mL/hr over 30 Minutes Intravenous Every 12 hours 05/29/18 1059 05/30/18 1326   05/29/18 1000  vancomycin (VANCOCIN) IVPB 750 mg/150 ml premix  Status:  Discontinued      750 mg 150 mL/hr over 60 Minutes Intravenous Every 24 hours 05/28/18 1004 05/29/18 1052   05/28/18 2200  ceFEPIme (MAXIPIME) 1 g in sodium chloride 0.9 % 100 mL IVPB  Status:  Discontinued     1 g 200 mL/hr over 30 Minutes Intravenous Every 12 hours 05/28/18 1004 05/29/18 1052   05/28/18 0845  ceFEPIme (MAXIPIME) 2 g in sodium chloride 0.9 % 100 mL IVPB     2 g 200 mL/hr over 30 Minutes Intravenous  Once 05/28/18 0834 05/28/18 0931   05/28/18 0845  vancomycin (VANCOCIN) IVPB 1000 mg/200 mL premix  Status:  Discontinued     1,000 mg 200 mL/hr over 60 Minutes Intravenous  Once 05/28/18 0834 05/28/18 0839   05/28/18 0845  vancomycin (VANCOCIN) 1,500 mg in sodium chloride 0.9 % 500 mL IVPB     1,500 mg 250 mL/hr over 120 Minutes Intravenous  Once 05/28/18 0839 05/28/18 1156       Objective: Physical Exam: Vitals:   06/05/18 0013 06/05/18 0411 06/05/18 0622 06/05/18 0826  BP: 114/60 (!) 127/59  (!) 121/49  Pulse: 90 98  86  Resp: (!) 24 (!) 22  20  Temp: 99.9 F (37.7 C) 99.3 F (37.4 C) (!) 102.8 F (39.3 C)  TempSrc: Axillary Axillary Rectal   SpO2: 96% 91%  96%  Weight:      Height:        Intake/Output Summary (Last 24 hours) at 06/05/2018 1042 Last data filed at 06/05/2018 0900 Gross per 24 hour  Intake -  Output 800 ml  Net -800 ml   Filed Weights   05/28/18 0826  Weight: 83.4 kg   Gen: Frail debilitated elderly male, sitting up in bed, oriented to self and place HEENT: PERRLA, Neck supple, no JVD Lungs: Decreased breath sounds at the right base, rhonchi on the left CVS: S1-S2/regular rate rhythm Abd: soft, Non tender, non distended, BS present Extremities: Trace edema  skin: no new rashes  Data Reviewed: CBC: Recent Labs  Lab 05/30/18 0831 05/31/18 0735 06/01/18 0253 06/02/18 0254 06/03/18 0229 06/04/18 0247 06/05/18 0242  WBC 4.7 8.6 7.2 5.2 4.4 5.5 5.4  NEUTROABS 2.0 4.9  --   --   --   --   --   HGB 9.0* 9.0* 8.4* 7.2* 8.1* 7.9* 7.9*  HCT 28.9*  28.1* 28.0* 24.5* 27.5* 25.4* 26.5*  MCV 84.5 80.3 86.4 86.9 88.4 86.7 88.3  PLT 112* 115* 93* 95* 81* 78* 68*   Basic Metabolic Panel: Recent Labs  Lab 05/31/18 0306 06/01/18 0253 06/02/18 0254 06/03/18 0229 06/04/18 0247 06/05/18 0242  NA 140 140 139 140 140 141  K 4.0 3.6 3.6 3.8 3.7 4.9  CL 107 105 105 107 104 104  CO2 20* 24 24 22 24 25   GLUCOSE 188* 168* 132* 127* 121* 143*  BUN 38* 47* 50* 45* 47* 44*  CREATININE 2.09* 2.29* 2.20* 2.29* 2.08* 2.09*  CALCIUM 8.4* 8.4* 8.0* 8.1* 7.8* 8.0*  MG 2.0  --   --   --   --   --     Liver Function Tests: Recent Labs  Lab 05/31/18 0306 06/02/18 0254  AST 20  --   ALT 19  --   ALKPHOS 55  --   BILITOT 1.0  --   PROT 7.3 6.3*  ALBUMIN 2.3*  --    No results for input(s): LIPASE, AMYLASE in the last 168 hours. No results for input(s): AMMONIA in the last 168 hours. Coagulation Profile: No results for input(s): INR, PROTIME in the last 168 hours. Cardiac Enzymes: Recent Labs  Lab 05/31/18 0306  CKTOTAL 22*   BNP (last 3 results) No results for input(s): PROBNP in the last 8760 hours. CBG: Recent Labs  Lab 06/04/18 1139 06/04/18 1722 06/04/18 2138 06/04/18 2333 06/05/18 0824  GLUCAP 153* 140* 223* 182* 100*   Studies: Dg Chest 2 View  Result Date: 06/05/2018 CLINICAL DATA:  Fever and shortness of breath today. EXAM: CHEST - 2 VIEW COMPARISON:  06/03/2018 FINDINGS: The heart is mildly enlarged but stable. Persistent dense right upper lobe airspace consolidation, likely pneumonia. No recurrent right pleural effusion. Stable right basilar atelectasis. Persistent small left effusion and left basilar atelectasis. IMPRESSION: Stable dense right upper lobe airspace consolidation. Persistent small left effusion and overlying atelectasis. Electronically Signed   By: Marijo Sanes M.D.   On: 06/05/2018 09:11     Time spent: 35 minutes  Domenic Polite , MD [facility] Page via [email or web address] 06/05/2018 10:42  AM  Between 7PM-7AM, please contact night-coverage at [email or web address], password [facility]

## 2018-06-05 NOTE — Progress Notes (Signed)
PT Cancellation Note  Patient Details Name: Ricardo Watkins MRN: [id number] DOB: 1931-01-02   Cancelled Treatment:    Reason Eval/Treat Not Completed: Fatigue/lethargy limiting ability to participate;Patient's level of consciousness(Chart reviewed, RN consulted. Pt just transfered rooms on foot c RN, also very active recently for rectal probe placement/adjustment. Pt alseep upon entry, family reports pt very worn out after recent activity. Decision made to allow patient to rest at this time. Will attempt treatment again at later date/time.)  3:04 PM, 06/05/18 Etta Grandchild, PT, DPT Physical Therapist - [place] [phone] (Pager)  [phone] (Office)      Buccola,Allan C 06/05/2018, 3:04 PM

## 2018-06-05 NOTE — Progress Notes (Signed)
Daily Progress Note   Patient Name: Ricardo Watkins       Date: 06/05/2018 DOB: Apr 16, 1931  Age: 84 y.o. MRN#: [id number] Attending Physician: Domenic Polite, MD Primary Care Physician: [name], MD Admit Date: 05/28/2018  Reason for Consultation/Follow-up: Establishing goals of care  Subjective: Patient awake, alert, oriented. Sitting up in chair. Denies pain or discomfort. Asks for water.   Family at bedside including wife, daughter, and son-in-law. Introduced myself with palliative medicine and further support this hospitalization (initial discussions with Farmer PA). Daughter, Juliann Pulse, shares that today has been the "best day" since he was admitted. His room was changed from 2W08 to room 2 and he was able to ambulate down the hall to new room. He is content and sitting in his chair. We discussed needing antibiotics for 2 weeks. Also discussed possible input from [name] regarding steps moving forward. Family highly appreciates and trusts [name] since she has known Ricardo Watkins since diagnosis of MDS.   Gave PMT contact information and reassured of ongoing support. Answered questions and concerns.   Length of Stay: 8  Current Medications: Scheduled Meds:  . sodium chloride   Intravenous Once  . acetaminophen  650 mg Oral Once  . aspirin EC  81 mg Oral Daily  . clopidogrel  75 mg Oral Daily  . gabapentin  100 mg Oral QHS  . insulin aspart  0-5 Units Subcutaneous QHS  . insulin aspart  0-9 Units Subcutaneous TID WC  . insulin glargine  20 Units Subcutaneous q morning - 10a  . metoprolol succinate  50 mg Oral Daily  . multivitamin with minerals  1 tablet Oral q morning - 10a  . naphazoline-pheniramine  1 drop Both Eyes Daily  . polyethylene glycol  17 g Oral Daily  .  potassium chloride  40 mEq Oral BID  . pravastatin  40 mg Oral q1800  . senna-docusate  1 tablet Oral BID  . tamsulosin  0.4 mg Oral Daily    Continuous Infusions: . ampicillin-sulbactam (UNASYN) IV 3 g (06/05/18 1301)    PRN Meds: acetaminophen, ipratropium-albuterol, loperamide, neomycin-bacitracin-polymyxin, nitroGLYCERIN, ondansetron (ZOFRAN) IV, traMADol  Physical Exam Vitals signs and nursing note reviewed.  Constitutional:      General: He is awake.  HENT:     Head: Normocephalic and atraumatic.  Cardiovascular:     Rate and Rhythm: Regular rhythm.  Pulmonary:     Effort: No tachypnea, accessory muscle usage or respiratory distress.  Skin:    General: Skin is warm.     Coloration: Skin is pale.  Neurological:     Mental Status: He is alert and oriented to person, place, and time.            Vital Signs: BP 120/89 (BP Location: Right Arm)   Pulse 85   Temp 99.5 F (37.5 C)   Resp (!) 22   Ht 5\' 9"  (1.753 m)   Wt 83.4 kg   SpO2 98%   BMI 27.15 kg/m  SpO2: SpO2: 98 % O2 Device: O2 Device: Nasal Cannula O2 Flow Rate: O2 Flow Rate (L/min): 2 L/min  Intake/output summary:   Intake/Output Summary (Last 24 hours) at 06/05/2018 1438 Last data filed at 06/05/2018 1430 Gross per 24 hour  Intake 480 ml  Output 1050 ml  Net -570 ml   LBM: Last BM Date: 06/03/18 Baseline Weight: Weight: 83.4 kg Most recent weight: Weight: 83.4 kg       Palliative Assessment/Data: PPS 50%   Flowsheet Rows     Most Recent Value  Intake Tab  Referral Department  Hospitalist  Unit at Time of Referral  ER  Palliative Care Primary Diagnosis  Cardiac  Date Notified  05/28/18  Palliative Care Type  New Palliative care  Reason for referral  End of Life Care Assistance  Date of Admission  05/28/18  # of days IP prior to Palliative referral  0  Clinical Assessment  Psychosocial & Spiritual Assessment  Palliative Care Outcomes      Patient Active Problem List   Diagnosis  Date Noted  . CRF (chronic renal failure), stage 3 (moderate) (HCC)   . Sepsis due to Enterococcus ([place])   . Bacteremia   . Palliative care encounter   . HCAP (healthcare-associated pneumonia) 05/28/2018  . Goals of care, counseling/discussion 05/28/2018  . Febrile illness, acute 05/01/2018  . Unstable angina ([place])   . Chronic systolic CHF (congestive heart failure) ([place])   . Acute on chronic combined systolic and diastolic CHF (congestive heart failure) ([place]) 04/22/2018  . Nonrheumatic aortic valve stenosis   . Chest pain in adult 04/20/2018  . Chronic neutropenia ([place]) 03/02/2018  . CHF (congestive heart failure) ([place]) 02/19/2018  . Upper airway cough syndrome 10/27/2017  . Abnormal CT of the chest 07/27/2017  . Flu-like symptoms 06/02/2017  . Elevated lactic acid level 06/02/2017  . Acute kidney injury superimposed on chronic kidney disease ([place]) 06/02/2017  . CKD (chronic kidney disease), stage IV ([place]) 12/16/2016  . Bilateral carotid artery stenosis 12/11/2016  . Dyslipidemia 12/11/2016  . Anemia in chronic kidney disease 07/01/2016  . Chronic ITP (idiopathic thrombocytopenia) (HCC) 07/01/2016  . B12 deficiency anemia   . Chest tube in place   . Acute diastolic CHF (congestive heart failure) ([place]) 04/16/2016  . Acute respiratory failure with hypoxia ([place]) 04/14/2016  . Empyema lung ([place])   . Type 2 diabetes mellitus ([place]) 04/04/2016  . Empyema ([place]) 04/03/2016  . Thyroid nodule 03/03/2016  . Sepsis ([place]) 02/17/2016  . DOE (dyspnea on exertion) 01/27/2016  . Pulmonary edema with congestive heart failure ([place]) 11/12/2015  . Cough, persistent 11/04/2015  . Pleural effusion, left 11/04/2015  . Anorexia 08/08/2015  . Microcytic anemia 07/07/2015  . Pancytopenia ([place]) 04/08/2015  . Diarrhea 04/08/2015  . Encounter for chemotherapy management 04/02/2015  . Neutropenia (  [place]) 02/18/2015  . MDS (myelodysplastic syndrome) with 5q deletion ([place]) 11/20/2014  . Deficiency anemia  04/19/2014  . Thrombocytopenia ([place]) 04/19/2014  . Vitamin B12 deficiency 04/19/2014  . Chronic renal failure, stage 3 (moderate) ([place]) 04/19/2014  . Macrocytic anemia 03/20/2013  . Monocytosis 03/20/2013  . Thrombotic stroke ([place]) 09/10/2011  . Prostate CA ([place]) 09/10/2011  . Inguinal hernia   . Colon polyps   . CAD (coronary artery disease) 08/26/2010  . Murmur 08/26/2010  . MURMUR 08/26/2010  . History of lymphoma 08/25/2010  . THROMBOCYTOPENIA 08/25/2010  . Essential hypertension 08/25/2010  . Coronary atherosclerosis 08/25/2010  . PVD 08/25/2010    Palliative Care Assessment & Plan   Patient Profile: 84 y.o. male  with past medical history of MDS, CKD3, HF with an EF of 30-35%, PVD s/p renal stenting, prostate cancer s/p radiation, and CAD no appropriate for invasive therapies - who was admitted on 05/28/2018 with chest pain and a productive cough.  He was found to have bacteremia and pneumonia.  Mr. Penley has had 13 ED visits in the last year and 4 hospital admissions in 3 months. CT chest with rounded dense mass like opacity in right upper lobe (pneumonia vs. Mass/cancer) and spiculated nodule in left upper lob.   Assessment: Sepsis Enterococcal bacteremia Right pleural effusion Myelodysplastic syndrome  CAD/chronic systolic & diastolic CHF AKI with underlying CKD  Recommendations/Plan:  DNR/DNI  Continue current plan of care and medical management  Pending decision for outpatient palliative versus hospice services. Attending to speak with [name].   PMT will continue to support patient/family.  Code Status: DNR   Code Status Orders  (From admission, onward)         Start     Ordered   05/28/18 1016  Do not attempt resuscitation (DNR)  Continuous    Question Answer Comment  In the event of cardiac or respiratory ARREST Do not call a "code blue"   In the event of cardiac or respiratory ARREST Do not perform Intubation, CPR, defibrillation or ACLS   In  the event of cardiac or respiratory ARREST Use medication by any route, position, wound care, and other measures to relive pain and suffering. May use oxygen, suction and manual treatment of airway obstruction as needed for comfort.      05/28/18 1019        Code Status History    Date Active Date Inactive Code Status Order ID Comments User Context   05/01/2018 1604 05/03/2018 2216 DNR [id number]  Karmen Bongo, MD ED   04/20/2018 1557 04/22/2018 1712 Full Code [id number]  Caren Griffins, MD ED   02/19/2018 1540 02/21/2018 1658 Full Code [id number]  Cristal Deer, MD Inpatient   06/02/2017 1728 06/04/2017 1652 Partial Code [id number]  Radene Gunning, NP ED   06/02/2017 1612 06/02/2017 1728 Full Code [id number]  Radene Gunning, NP ED   04/04/2016 0123 04/20/2016 1808 Full Code [id number]  Reubin Milan, MD ED   02/17/2016 2248 02/21/2016 1811 Full Code [id number]  Norval Morton, MD Inpatient       Prognosis:  Poor prognosis  Discharge Planning:  To Be Determined  Care plan was discussed with patient, family  Thank you for allowing the Palliative Medicine Team to assist in the care of this patient.   Time In: 1410 Time Out: 1440 Total Time 30 Prolonged Time Billed no       Greater than 50%  of this time was spent counseling and coordinating  care related to the above assessment and plan.  Ihor Dow, FNP-C Palliative Medicine Team  Phone: [phone] Fax: [phone]  Please contact Palliative Medicine Team phone at [phone] for questions and concerns.

## 2018-06-05 NOTE — Progress Notes (Signed)
Patient tolerated walk (with walker) from 2W08 to 2W02 very well. O2 sat stayed in high 90s on 2L [place] and is now resting comfortably at 94% on room air. Mentation has improved today compared to yesterday. Temperature probe replaced in conjunction with cooling blanket, currently reading 100.4*F. Will continue to monitor.  Kinnie Scales, RN 06/05/18 2:40 PM

## 2018-06-06 DIAGNOSIS — R918 Other nonspecific abnormal finding of lung field: Secondary | ICD-10-CM

## 2018-06-06 DIAGNOSIS — Z888 Allergy status to other drugs, medicaments and biological substances status: Secondary | ICD-10-CM

## 2018-06-06 LAB — GLUCOSE, CAPILLARY
GLUCOSE-CAPILLARY: 137 mg/dL — AB (ref 70–99)
Glucose-Capillary: 197 mg/dL — ABNORMAL HIGH (ref 70–99)
Glucose-Capillary: 194 mg/dL — ABNORMAL HIGH (ref 70–99)
Glucose-Capillary: 204 mg/dL — ABNORMAL HIGH (ref 70–99)

## 2018-06-06 LAB — CBC
HCT: 28.4 % — ABNORMAL LOW (ref 39.0–52.0)
Hemoglobin: 8.4 g/dL — ABNORMAL LOW (ref 13.0–17.0)
MCH: 26.5 pg (ref 26.0–34.0)
MCHC: 29.6 g/dL — AB (ref 30.0–36.0)
MCV: 89.6 fL (ref 80.0–100.0)
Platelets: 57 10*3/uL — ABNORMAL LOW (ref 150–400)
RBC: 3.17 MIL/uL — ABNORMAL LOW (ref 4.22–5.81)
RDW: 20.1 % — ABNORMAL HIGH (ref 11.5–15.5)
WBC: 5 10*3/uL (ref 4.0–10.5)
nRBC: 0 % (ref 0.0–0.2)

## 2018-06-06 LAB — CULTURE, BLOOD (ROUTINE X 2)
CULTURE: NO GROWTH
Culture: NO GROWTH
Special Requests: ADEQUATE
Special Requests: ADEQUATE

## 2018-06-06 LAB — BASIC METABOLIC PANEL
Anion gap: 10 (ref 5–15)
BUN: 41 mg/dL — AB (ref 8–23)
CHLORIDE: 103 mmol/L (ref 98–111)
CO2: 26 mmol/L (ref 22–32)
Calcium: 8.4 mg/dL — ABNORMAL LOW (ref 8.9–10.3)
Creatinine, Ser: 1.99 mg/dL — ABNORMAL HIGH (ref 0.61–1.24)
GFR calc Af Amer: 34 mL/min — ABNORMAL LOW (ref >60)
GFR calc non Af Amer: 30 mL/min — ABNORMAL LOW (ref >60)
Glucose, Bld: 139 mg/dL — ABNORMAL HIGH (ref 70–99)
Potassium: 5.3 mmol/L — ABNORMAL HIGH (ref 3.5–5.1)
Sodium: 139 mmol/L (ref 135–145)

## 2018-06-06 MED ORDER — ENSURE ENLIVE PO LIQD
237.0000 mL | Freq: Two times a day (BID) | ORAL | Status: DC
  Administered 2018-06-06 – 2018-06-12 (×11): 237 mL via ORAL

## 2018-06-06 MED ORDER — SODIUM CHLORIDE 0.9 % IV SOLN
1.0000 g | Freq: Two times a day (BID) | INTRAVENOUS | Status: AC
  Administered 2018-06-06 – 2018-06-12 (×13): 1 g via INTRAVENOUS
  Filled 2018-06-06 (×13): qty 1

## 2018-06-06 MED ORDER — FUROSEMIDE 10 MG/ML IJ SOLN
40.0000 mg | Freq: Once | INTRAMUSCULAR | Status: AC
  Administered 2018-06-06: 40 mg via INTRAVENOUS
  Filled 2018-06-06: qty 4

## 2018-06-06 MED ORDER — GUAIFENESIN 100 MG/5ML PO SOLN
5.0000 mL | ORAL | Status: DC | PRN
  Administered 2018-06-06: 100 mg via ORAL
  Filled 2018-06-06: qty 5

## 2018-06-06 NOTE — Progress Notes (Signed)
Pharmacy Antibiotic Note  Ricardo Watkins is a 84 y.o. male admitted on 05/28/2018 with enterococcus faecium bacteremia. Patient received 9 days of treatment with Unasyn. Patient now febrile for the past 72 hours, with Tmax of 101.5 today, despite being on Unasyn. Pharmacy has been consulted for meropenem dosing. 12/9 repeat blood cultures are NGTD. WBC WNL.  Plan: Discontinue Unasyn Start meropenem 1 gm IV every 12 hours Monitor renal function, fever curve, clinical progress  Follow-up LOT  Height: 5\' 9"  (175.3 cm) Weight: 183 lb 13.8 oz (83.4 kg) IBW/kg (Calculated) : 70.7  Temp (24hrs), Avg:100.2 F (37.9 C), Min:99 F (37.2 C), Max:101.5 F (38.6 C)  Recent Labs  Lab 06/02/18 0254 06/03/18 0229 06/04/18 0247 06/05/18 0242 06/06/18 0213  WBC 5.2 4.4 5.5 5.4 5.0  CREATININE 2.20* 2.29* 2.08* 2.09* 1.99*    Estimated Creatinine Clearance: 27.1 mL/min (A) (by C-G formula based on SCr of 1.99 mg/dL (H)).    Allergies  Allergen Reactions  . Glucophage [Metformin Hydrochloride] Other (See Comments)    Chest pain  . Zetia [Ezetimibe] Other (See Comments)    weakness  . Fenofibrate Rash  . Niacin-Lovastatin Er Rash    Antimicrobials this admission: Meropenem 12/17 >> Unasyn 12/9>>12/10, 12/11>>12/17 Daptomycin 12/11>>12/10 Vanc 12/8>>12/9 Cefepime 12/8>> 12/9  Microbiology results: 12/10 BCx: NGTD 12/8 BCx: Enterococcus faecium (S- amp, vanc) 12/9 BCx: NGTD  Thank you for allowing pharmacy to be a part of this patient's care.  Leron Croak, PharmD PGY1 Pharmacy Resident  Please check AMION for all [facility] Pharmacy phone numbers 06/06/2018 4:31 PM

## 2018-06-06 NOTE — Progress Notes (Signed)
[facility] Progress Note  Patient: Ricardo Watkins LYY:[id number]   PCP: [name], MD DOB: 17-Apr-1931   DOA: 05/28/2018   DOS: 06/06/2018   Date of Service: the patient was seen and examined on 06/06/2018  Brief hospital course: Pt. with PMH of PVD s/p R renal artery stent; remote prostate CA s/p seed radiation; NHL; HTN; HLD; DM; chronic ITP; CHF; and CAD s/p stent(recentlydeemed not an interventional candidate)h/o 4 admission in 12 months and 13 ED visits in 12 months; admitted on 05/28/2018, presented with complaint of chills and chest pain, was found to have pneumonia and enterococcus bacteremia.  S/P 2 PRBC on 05/29/2018 -ID following, now on IV Unasyn -CT chest with rounded dense masslike opacity in the right upper lobe, rounded pneumonia versus mass/cancer and a spiculated nodule in the left upper lobe as well -Underwent thoracentesis 12/14, continues to be frequently transfusion dependent due to advanced MDS -12/14 underwent thoracentesis, fluid was transudative -Was febrile again 12/15 into 12/16 a.m and 12/17  Subjective:   -Febrile again to 101.5 -Mild cough but considerably better, no nausea and vomiting no significant abdominal pain no diarrhea  Assessment and Plan: 1.  Sepsis/enterococcal bacteremia -Presumed to be secondary to healthcare associated pneumonia, present on admission -Repeat x-ray 12/11 showed worsening opacity in the right upper lobe  -Blood cultures grew enterococcus faecium -Antibiotics transitioned back to Unasyn per infectious disease 12/11 -Infectious disease was consulted recommended 2-week course of IV Unasyn until 12/23, repeat cultures are negative, CT chest with right upper lobe lung mass versus rounded pneumonia no acute abdominal findings  -Unfortunately has had recurrent fevers for the last 72 hours, all repeat blood cultures are negative, chest x-ray with stable right lung pneumonia, CT abdomen done earlier this admission was  unremarkable -Concern for possible tumor fever versus antibiotic induced, asked ID to reevaluate today  2.    Moderate right pleural effusion -Likely associated with right upper lobe pneumonia vs mass -Underwent thoracentesis in radiology 12/14 with 650 cc fluid drained appears transudate of, await cytology -Unfortunately cultures did not get sent will check with microbiology and see if we can add this on  3. Myelodysplastic syndrome with 5 q. Deletion. -With symptomatic anemia, chronic neutropenia and thrombocytopenia -Transfused 2 units of PRBC for hemoglobin of 6.9 on 12/9, and then another unit of PRBC on 12/13 -On supportive care, transfuse as needed -He is also on Nplate and erythropoietin -Prognosis appears quite poor at this time, although discussed situation with Dr.Gorsuch, who agrees that he worsen with frequent hospitalizations, recurrent infections and potential need to discuss hospice, she will follow-up with him soon as outpatient  3.  CAD/chronic systolic and diastolic CHF -Echocardiogram in 9/2 0 19 showed EF of 30 to 12% with diastolic dysfunction -Repeat echo now showed improvement in EF to 50%, no overt vegetations -Given IV Lasix intermittently -Continue aspirin, Plavix, Toprol, Imdur  4.  Acute kidney injury on chronic kidney disease stage IV. Baseline renal function around 1.8-1.7 -Creatinine was 2.4 on admission, trended down to 2.0, back up to 2.2 with worsening BUN as well -likely due to ongoing sepsis -Diuretics were held in the setting of sepsis and high fevers -Diuretics restarted, use as needed  5. 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 Thrombocytopenia -due to MDS  7.  Goals of care discussion. -Multiple frequent hospitalizations -Seen by palliative care for goals of care  discussion, Patient is DNR/DNI -Prognosis appears  poor overall, discussed this with daughter at bedside, he would qualify for home hospice however would not be eligible for blood transfusions then, d/w Dr.Gorsuch 12/16, she will discuss poor prognosis and options with family post discharge with quick FU  Diet: regular diet thin DVT Prophylaxis: mechanical compression device, avoid heparin and lovenox  Advance goals of care discussion: DNR DNI  Family Communication: Granddaughter at bedside, discussed with daughter 12/16 Disposition: To be determined  Consultants: ID, Palliative care  Procedures: 12/14 thoracentesis, 650 cc of serous pleural fluid drained  echocardiogram   Scheduled Meds: . sodium chloride   Intravenous Once  . acetaminophen  650 mg Oral Once  . aspirin EC  81 mg Oral Daily  . clopidogrel  75 mg Oral Daily  . gabapentin  100 mg Oral QHS  . insulin aspart  0-5 Units Subcutaneous QHS  . insulin aspart  0-9 Units Subcutaneous TID WC  . insulin glargine  20 Units Subcutaneous q morning - 10a  . metoprolol succinate  50 mg Oral Daily  . multivitamin with minerals  1 tablet Oral q morning - 10a  . naphazoline-pheniramine  1 drop Both Eyes Daily  . polyethylene glycol  17 g Oral Daily  . pravastatin  40 mg Oral q1800  . senna-docusate  1 tablet Oral BID  . tamsulosin  0.4 mg Oral Daily   Continuous Infusions: . ampicillin-sulbactam (UNASYN) IV 3 g (06/06/18 0239)   PRN Meds: acetaminophen, guaiFENesin, ipratropium-albuterol, loperamide, neomycin-bacitracin-polymyxin, nitroGLYCERIN, ondansetron (ZOFRAN) IV, traMADol Antibiotics: Anti-infectives (From admission, onward)   Start     Dose/Rate Route Frequency Ordered Stop   05/31/18 1000  Ampicillin-Sulbactam (UNASYN) 3 g in sodium chloride 0.9 % 100 mL IVPB     3 g 200 mL/hr over 30 Minutes Intravenous Every 12 hours 05/31/18 0841 06/13/18 1159   05/30/18 1500  DAPTOmycin (CUBICIN) 670 mg in sodium chloride 0.9 % IVPB  Status:  Discontinued     670 mg 226.8 mL/hr  over 30 Minutes Intravenous Every 48 hours 05/30/18 1327 05/31/18 0835   05/29/18 1400  Ampicillin-Sulbactam (UNASYN) 3 g in sodium chloride 0.9 % 100 mL IVPB  Status:  Discontinued     3 g 200 mL/hr over 30 Minutes Intravenous Every 12 hours 05/29/18 1059 05/30/18 1326   05/29/18 1000  vancomycin (VANCOCIN) IVPB 750 mg/150 ml premix  Status:  Discontinued     750 mg 150 mL/hr over 60 Minutes Intravenous Every 24 hours 05/28/18 1004 05/29/18 1052   05/28/18 2200  ceFEPIme (MAXIPIME) 1 g in sodium chloride 0.9 % 100 mL IVPB  Status:  Discontinued     1 g 200 mL/hr over 30 Minutes Intravenous Every 12 hours 05/28/18 1004 05/29/18 1052   05/28/18 0845  ceFEPIme (MAXIPIME) 2 g in sodium chloride 0.9 % 100 mL IVPB     2 g 200 mL/hr over 30 Minutes Intravenous  Once 05/28/18 0834 05/28/18 0931   05/28/18 0845  vancomycin (VANCOCIN) IVPB 1000 mg/200 mL premix  Status:  Discontinued     1,000 mg 200 mL/hr over 60 Minutes Intravenous  Once 05/28/18 0834 05/28/18 0839   05/28/18 0845  vancomycin (VANCOCIN) 1,500 mg in sodium chloride 0.9 % 500 mL IVPB     1,500 mg 250 mL/hr over 120 Minutes Intravenous  Once 05/28/18 0839 05/28/18 1156       Objective: Physical Exam: Vitals:   06/05/18 2310 06/06/18 0341 06/06/18 0419 06/06/18 0832  BP: 130/84 Marland Kitchen)  165/81  (!) 141/62  Pulse: 85 (!) 104  94  Resp: (!) 21 (!) 28  (!) 26  Temp: 99 F (37.2 C) (!) 101.5 F (38.6 C)  99.9 F (37.7 C)  TempSrc: Oral Rectal  Axillary  SpO2: 94% 99% 99% 95%  Weight:      Height:        Intake/Output Summary (Last 24 hours) at 06/06/2018 1029 Last data filed at 06/06/2018 0600 Gross per 24 hour  Intake 240 ml  Output 1300 ml  Net -1060 ml   Filed Weights   05/28/18 0826  Weight: 83.4 kg   Gen: Awake, Alert, Oriented X 2, Ill appearing, frail elderly debilitated male, sitting up in bed HEENT: PERRLA, Neck supple, no JVD Lungs: decreased BS at both bases CVS: S1S2/RRR Abd: soft, Non tender, non  distended, BS present Extremities: No edema Skin: no new rashes  Data Reviewed: CBC: Recent Labs  Lab 05/31/18 0735  06/02/18 0254 06/03/18 0229 06/04/18 0247 06/05/18 0242 06/06/18 0213  WBC 8.6   < > 5.2 4.4 5.5 5.4 5.0  NEUTROABS 4.9  --   --   --   --   --   --   HGB 9.0*   < > 7.2* 8.1* 7.9* 7.9* 8.4*  HCT 28.1*   < > 24.5* 27.5* 25.4* 26.5* 28.4*  MCV 80.3   < > 86.9 88.4 86.7 88.3 89.6  PLT 115*   < > 95* 81* 78* 68* 57*   < > = values in this interval not displayed.   Basic Metabolic Panel: Recent Labs  Lab 05/31/18 0306  06/02/18 0254 06/03/18 0229 06/04/18 0247 06/05/18 0242 06/06/18 0213  NA 140   < > 139 140 140 141 139  K 4.0   < > 3.6 3.8 3.7 4.9 5.3*  CL 107   < > 105 107 104 104 103  CO2 20*   < > 24 22 24 25 26   GLUCOSE 188*   < > 132* 127* 121* 143* 139*  BUN 38*   < > 50* 45* 47* 44* 41*  CREATININE 2.09*   < > 2.20* 2.29* 2.08* 2.09* 1.99*  CALCIUM 8.4*   < > 8.0* 8.1* 7.8* 8.0* 8.4*  MG 2.0  --   --   --   --   --   --    < > = values in this interval not displayed.    Liver Function Tests: Recent Labs  Lab 05/31/18 0306 06/02/18 0254  AST 20  --   ALT 19  --   ALKPHOS 55  --   BILITOT 1.0  --   PROT 7.3 6.3*  ALBUMIN 2.3*  --    No results for input(s): LIPASE, AMYLASE in the last 168 hours. No results for input(s): AMMONIA in the last 168 hours. Coagulation Profile: No results for input(s): INR, PROTIME in the last 168 hours. Cardiac Enzymes: Recent Labs  Lab 05/31/18 0306  CKTOTAL 22*   BNP (last 3 results) No results for input(s): PROBNP in the last 8760 hours. CBG: Recent Labs  Lab 06/05/18 0824 06/05/18 1134 06/05/18 1647 06/05/18 2155 06/06/18 0742  GLUCAP 100* 150* 190* 182* 137*   Studies: No results found.   Time spent: 35 minutes  Domenic Polite , MD [facility] Page via [email or web address] 06/06/2018 10:29 AM  Between 7PM-7AM, please contact night-coverage at [email or web address], password [facility]

## 2018-06-06 NOTE — Progress Notes (Signed)
Physical Therapy Treatment Patient Details Name: Ricardo Watkins MRN: [id number] DOB: April 27, 1931 Today's Date: 06/06/2018    History of Present Illness 84 y.o. male admitted on 05/28/18 for weakness and cough.  Found to have sepsis due to HCAP with enterococcus bacteremia, myelodysplastic syndrome, symptomatic anemia s/p 2 Units PRBC, acute kidney injury on chronic kidney disease.  Pt with other significant PMH of DM 2, CKD, CAD, essential HTN, chronic combined diastolic and systolic CHF, chronic neutropenia, chronic thrombocytopenia, DVT, non  Hodgkin's lymphoma, stroke, and PVD.    PT Comments    Patient seen for mobility progression. Patient performing bed mobility and transfers with light Min A/min guard. Good progression of gait with patient ambulating 2 x 100 feet in hallway with RW - does require 1 seated rest break and cueing for safety and sequencing with device. Patient on 3L supplemental O2 with O2 sats remaining 90-100% with mobility. PT to continue to follow and progress. Making good progress towards all goals.     Follow Up Recommendations  Home health PT     Equipment Recommendations  Other (comment)(possible need for home O2)    Recommendations for Other Services       Precautions / Restrictions Precautions Precautions: Fall Precaution Comments: monitor O2 sats Restrictions Weight Bearing Restrictions: No    Mobility  Bed Mobility Overal bed mobility: Needs Assistance Bed Mobility: Supine to Sit;Sit to Supine     Supine to sit: Min guard Sit to supine: Min guard   General bed mobility comments: increased time and effort wtih use of bed rails; cueing for upright and hips forward  Transfers Overall transfer level: Needs assistance Equipment used: Rolling walker (2 wheeled) Transfers: Sit to/from Stand Sit to Stand: Min assist         General transfer comment: very light Min A to power up from bedside; cueing for hand placement to maximize  efficiency  Ambulation/Gait Ambulation/Gait assistance: Min guard Gait Distance (Feet): 100 Feet(2 reps with 1 seated rest break) Assistive device: Rolling walker (2 wheeled) Gait Pattern/deviations: Step-through pattern;Decreased stride length;Trunk flexed Gait velocity: decreased   General Gait Details: requires cueing for safety with AD - used 3L supplemental O2 due to O2 sats on 2L at rest ~88%; SpO2 with mobility 90-100%   Stairs             Wheelchair Mobility    Modified Rankin (Stroke Patients Only)       Balance Overall balance assessment: Needs assistance Sitting-balance support: Feet supported;Bilateral upper extremity supported Sitting balance-Leahy Scale: Fair     Standing balance support: Bilateral upper extremity supported;During functional activity Standing balance-Leahy Scale: Poor Standing balance comment: requires use of RW                            Cognition Arousal/Alertness: Awake/alert Behavior During Therapy: WFL for tasks assessed/performed Overall Cognitive Status: Within Functional Limits for tasks assessed                                        Exercises      General Comments General comments (skin integrity, edema, etc.): family present and supportive      Pertinent Vitals/Pain Pain Assessment: No/denies pain    Home Living  Prior Function            PT Goals (current goals can now be found in the care plan section) Acute Rehab PT Goals Patient Stated Goal: to go home and see his dog, Spanky PT Goal Formulation: With patient/family Time For Goal Achievement: 06/13/18 Potential to Achieve Goals: Good Progress towards PT goals: Progressing toward goals    Frequency    Min 3X/week      PT Plan Current plan remains appropriate    Co-evaluation              AM-PAC PT "6 Clicks" Mobility   Outcome Measure  Help needed turning from your back to your  side while in a flat bed without using bedrails?: A Little Help needed moving from lying on your back to sitting on the side of a flat bed without using bedrails?: A Little Help needed moving to and from a bed to a chair (including a wheelchair)?: A Little Help needed standing up from a chair using your arms (e.g., wheelchair or bedside chair)?: A Little Help needed to walk in hospital room?: A Little Help needed climbing 3-5 steps with a railing? : A Lot 6 Click Score: 17    End of Session Equipment Utilized During Treatment: Gait belt;Oxygen Activity Tolerance: Patient tolerated treatment well Patient left: in bed;with call bell/phone within reach;with family/visitor present Nurse Communication: Mobility status PT Visit Diagnosis: Muscle weakness (generalized) (M62.81);Difficulty in walking, not elsewhere classified (R26.2)     Time: 3428-7681 PT Time Calculation (min) (ACUTE ONLY): 32 min  Charges:  $Gait Training: 8-22 mins $Therapeutic Activity: 8-22 mins                     Lanney Gins, PT, DPT Supplemental Physical Therapist 06/06/18 3:53 PM Pager: [phone] Office: [phone]

## 2018-06-06 NOTE — Progress Notes (Signed)
Initial Nutrition Assessment  DOCUMENTATION CODES:   Not applicable  INTERVENTION:    Ensure Enlive po BID, each supplement provides 350 kcal and 20 grams of protein  NUTRITION DIAGNOSIS:   Increased nutrient needs related to chronic illness as evidenced by estimated needs  GOAL:   Patient will meet greater than or equal to 90% of their needs  MONITOR:   PO intake, Supplement acceptance, Labs, Skin, Weight trends, I & O's  REASON FOR ASSESSMENT:   Malnutrition Screening Tool  ASSESSMENT:   84 yo Male with PMH of PVD, DM; chronic ITP; CHF; and CAD s/p stent (recently deemed not an interventional candidate) h/o  4 admission in 12 months and 13 ED visits in 12 months; admitted on 05/28/2018, presented with complaint of chills and chest pain, was found to have pneumonia and enterococcus bacteremia  RD briefly spoke with family at bedside.  His son and wife are present. Family report pt was eating well PTA. They state "he does well, he still gets around in his Gator".  Pt does not typically consume and Ensure and/or Boost supplements. Medications include Lasix, Lantus, Imodium and MVI daily. Labs reviewed. K 5.3 (H). BUN 41 (H). Cr 1.99 (H).  CBG's 307 107 1507.  Palliative Medicine Team note reviewed.  NUTRITION - FOCUSED PHYSICAL EXAM:    Most Recent Value  Orbital Region  Unable to assess  Upper Arm Region  Unable to assess  Thoracic and Lumbar Region  Unable to assess  Buccal Region  Moderate depletion  Temple Region  Moderate depletion  Clavicle Bone Region  Severe depletion  Clavicle and Acromion Bone Region  Severe depletion  Scapular Bone Region  Unable to assess  Dorsal Hand  Unable to assess  Patellar Region  Unable to assess  Anterior Thigh Region  Unable to assess  Posterior Calf Region  Unable to assess  Edema (RD Assessment)  None     Diet Order:   Diet Order            Diet regular Room service appropriate? Yes; Fluid consistency: Thin  Diet  effective now             EDUCATION NEEDS:   Not appropriate for education at this time  Skin:  Skin Assessment: Reviewed RN Assessment  Last BM:  12/16  Height:   Ht Readings from Last 1 Encounters:  05/28/18 5\' 9"  (1.753 m)   Weight:   Wt Readings from Last 1 Encounters:  05/28/18 83.4 kg   BMI:  Body mass index is 27.15 kg/m.  Estimated Nutritional Needs:   Kcal:  1800-2000  Protein:  90-105 gm  Fluid:  1.8-2.0 L  Arthur Holms, RD, LDN Pager #: [phone] After-Hours Pager #: [phone]

## 2018-06-06 NOTE — Progress Notes (Addendum)
INFECTIOUS DISEASE ATTENDING ADDENDUM:   This patient has been seen and discussed with the house staff. Please see the resident's note for complete details. I have reviewed the pertinent  laboratory and radiographic data and examined the patient independently.  I concur with their findings with the following additions/corrections:  I think spiculated mass in lung may be cause of fevers. If this were drug fever not many unrelated drug options to go to  We will go to [facility] for broader coveraage of lungs and SLIGHTLY different beta lactam for now.             [facility] Infectious Disease  Date of Admission:  05/28/2018   Total days of antibiotics: Day 9 of unasyn              ASSESSMENT: Enterococcus faecium bacteremia on Unasyn Multifocal PNA Right upper lobe lung mass versus rounded PNA  Recurrent fevers for the past 72 hours to 102F despite IV Unasyn. However, fever curve over the past few days is not drastically different from the rest of the hospitalization. No leukocytosis. Repeat blood cultures 12/15 were negative. Urine culture pending. DDx for fever includes resistant or new pneumonia including aspiration pneumonia, fever associated with possible lung malignancy, and drug fever with unasyn. No findings on ROS or exam to suggest new source of infection or worsening pneumonia. Stable supplemental oxygen requirement and stable cough without sputum. Respiratory culture is unlikely without sputum production. No culture data on recent thoracentesis. Will continue Unasyn and reassess patient. If patient develops worsening hypoxia, fevers, cough, or leukocytosis, will consider broadening antibiotic regimen.   PLAN: 1. Continue Unasyn. Expected stop date 12/23.  Principal Problem:   HCAP (healthcare-associated pneumonia) Active Problems:   CAD (coronary artery disease)   Essential hypertension   Chronic renal failure, stage 3 (moderate) (HCC)   Myelodysplasia  (myelodysplastic syndrome) (HCC)   Pleural effusion   Sepsis (HCC)   Type 2 diabetes mellitus (HCC)   CHF (congestive heart failure) (HCC)   Febrile illness, acute   Goals of care, counseling/discussion   Bacteremia   Palliative care encounter   Sepsis due to Enterococcus ([place])   CRF (chronic renal failure), stage 3 (moderate) ([place])   Palliative care by specialist   Community acquired pneumonia, bilateral   Scheduled Meds: . sodium chloride   Intravenous Once  . acetaminophen  650 mg Oral Once  . aspirin EC  81 mg Oral Daily  . clopidogrel  75 mg Oral Daily  . gabapentin  100 mg Oral QHS  . insulin aspart  0-5 Units Subcutaneous QHS  . insulin aspart  0-9 Units Subcutaneous TID WC  . insulin glargine  20 Units Subcutaneous q morning - 10a  . metoprolol succinate  50 mg Oral Daily  . multivitamin with minerals  1 tablet Oral q morning - 10a  . naphazoline-pheniramine  1 drop Both Eyes Daily  . polyethylene glycol  17 g Oral Daily  . pravastatin  40 mg Oral q1800  . senna-docusate  1 tablet Oral BID  . tamsulosin  0.4 mg Oral Daily   Continuous Infusions: . ampicillin-sulbactam (UNASYN) IV 3 g (06/06/18 0239)   PRN Meds:.acetaminophen, guaiFENesin, ipratropium-albuterol, loperamide, neomycin-bacitracin-polymyxin, nitroGLYCERIN, ondansetron (ZOFRAN) IV, traMADol   SUBJECTIVE: Ricardo Watkins has had recurrent fevers for the past 72 hours to 101.5. Repeat blood cultures on 12/15 were negative x2. Urine culture is pending. Patient continues to feel unwell, but he does not note any recent changes to his  symptoms. Per family he has a worsened cough over the past couple days, but it is nonproductive. He endorses chronic abdominal pain that his wife reports has present for over a year. He has had normal and regular BMs during his hospitalization. CT abdomen pelvis 12/11 was unremarkable.  Review of Systems: Review of Systems  Constitutional: Positive for fever. Negative for chills.    Respiratory: Positive for cough and shortness of breath. Negative for sputum production.   Gastrointestinal: Positive for abdominal pain.  Genitourinary: Negative for dysuria.  Skin: Negative for rash.    Allergies  Allergen Reactions  . Glucophage [Metformin Hydrochloride] Other (See Comments)    Chest pain  . Zetia [Ezetimibe] Other (See Comments)    weakness  . Fenofibrate Rash  . Niacin-Lovastatin Er Rash    OBJECTIVE: Vitals:   06/05/18 2310 06/06/18 0341 06/06/18 0419 06/06/18 0832  BP: 130/84 (!) 165/81  (!) 141/62  Pulse: 85 (!) 104  94  Resp: (!) 21 (!) 28  (!) 26  Temp: 99 F (37.2 C) (!) 101.5 F (38.6 C)  99.9 F (37.7 C)  TempSrc: Oral Rectal  Axillary  SpO2: 94% 99% 99% 95%  Weight:      Height:       Body mass index is 27.15 kg/m.  Physical Exam Gen: Patient sleeping comfortably in bed. Arouses easily to voice. Alert and oriented. Lungs: Decreased breath sounds at bilateral bases. CV: Systolic ejection murmur 3/6 Abdomen: Bowel sounds present. Soft, non-distended, non-tender. Extremities: No lower extremity edema. Skin: No wounds or rashes.  Lab Results Lab Results  Component Value Date   WBC 5.0 06/06/2018   HGB 8.4 (L) 06/06/2018   HCT 28.4 (L) 06/06/2018   MCV 89.6 06/06/2018   PLT 57 (L) 06/06/2018    Lab Results  Component Value Date   CREATININE 1.99 (H) 06/06/2018   BUN 41 (H) 06/06/2018   NA 139 06/06/2018   K 5.3 (H) 06/06/2018   CL 103 06/06/2018   CO2 26 06/06/2018    Lab Results  Component Value Date   ALT 19 05/31/2018   AST 20 05/31/2018   ALKPHOS 55 05/31/2018   BILITOT 1.0 05/31/2018     Microbiology: Recent Results (from the past 240 hour(s))  Blood Culture (routine x 2)     Status: None   Collection Time: 05/28/18  8:34 AM  Result Value Ref Range Status   Specimen Description BLOOD BLOOD RIGHT FOREARM  Final   Special Requests   Final    BOTTLES DRAWN AEROBIC AND ANAEROBIC Blood Culture adequate volume    Culture   Final    NO GROWTH 5 DAYS Performed at [facility], [place] [address] [place], [place] [address]    Report Status 06/02/2018 FINAL  Final  Urine culture     Status: Abnormal   Collection Time: 05/28/18  8:34 AM  Result Value Ref Range Status   Specimen Description URINE, RANDOM  Final   Special Requests NONE  Final   Culture (A)  Final    <10,000 COLONIES/mL INSIGNIFICANT GROWTH Performed at [facility], [place] [address] [place], [place] [address]    Report Status 05/29/2018 FINAL  Final  Blood Culture (routine x 2)     Status: Abnormal   Collection Time: 05/28/18  8:39 AM  Result Value Ref Range Status   Specimen Description BLOOD RIGHT ANTECUBITAL  Final   Special Requests   Final    BOTTLES DRAWN AEROBIC AND  ANAEROBIC Blood Culture adequate volume   Culture  Setup Time   Final    GRAM POSITIVE COCCI AEROBIC BOTTLE ONLY CRITICAL RESULT CALLED TO, READ BACK BY AND VERIFIED WITH: Salli Real [id number] MLM Performed at [facility], [place] [address] [place], [place] [address]    Culture ENTEROCOCCUS FAECIUM (A)  Final   Report Status 05/31/2018 FINAL  Final   Organism ID, Bacteria ENTEROCOCCUS FAECIUM  Final      Susceptibility   Enterococcus faecium - MIC*    AMPICILLIN <=2 SENSITIVE Sensitive     VANCOMYCIN 1 SENSITIVE Sensitive     GENTAMICIN SYNERGY SENSITIVE Sensitive     * ENTEROCOCCUS FAECIUM  Blood Culture ID Panel (Reflexed)     Status: Abnormal   Collection Time: 05/28/18  8:39 AM  Result Value Ref Range Status   Enterococcus species DETECTED (A) NOT DETECTED Final    Comment: CRITICAL RESULT CALLED TO, READ BACK BY AND VERIFIED WITH: PHARMD G ABBOTT [id number] MLM    Vancomycin resistance NOT DETECTED NOT DETECTED Final   Listeria monocytogenes NOT DETECTED NOT DETECTED Final   Staphylococcus species NOT DETECTED NOT DETECTED Final   Staphylococcus aureus (BCID) NOT DETECTED NOT DETECTED Final   Streptococcus species NOT  DETECTED NOT DETECTED Final   Streptococcus agalactiae NOT DETECTED NOT DETECTED Final   Streptococcus pneumoniae NOT DETECTED NOT DETECTED Final   Streptococcus pyogenes NOT DETECTED NOT DETECTED Final   Acinetobacter baumannii NOT DETECTED NOT DETECTED Final   Enterobacteriaceae species NOT DETECTED NOT DETECTED Final   Enterobacter cloacae complex NOT DETECTED NOT DETECTED Final   Escherichia coli NOT DETECTED NOT DETECTED Final   Klebsiella oxytoca NOT DETECTED NOT DETECTED Final   Klebsiella pneumoniae NOT DETECTED NOT DETECTED Final   Proteus species NOT DETECTED NOT DETECTED Final   Serratia marcescens NOT DETECTED NOT DETECTED Final   Haemophilus influenzae NOT DETECTED NOT DETECTED Final   Neisseria meningitidis NOT DETECTED NOT DETECTED Final   Pseudomonas aeruginosa NOT DETECTED NOT DETECTED Final   Candida albicans NOT DETECTED NOT DETECTED Final   Candida glabrata NOT DETECTED NOT DETECTED Final   Candida krusei NOT DETECTED NOT DETECTED Final   Candida parapsilosis NOT DETECTED NOT DETECTED Final   Candida tropicalis NOT DETECTED NOT DETECTED Final    Comment: Performed at [facility], [place]. [address], [place], [place] [address]  Respiratory Panel by PCR     Status: None   Collection Time: 05/28/18 12:38 PM  Result Value Ref Range Status   Adenovirus NOT DETECTED NOT DETECTED Final   Coronavirus 229E NOT DETECTED NOT DETECTED Final   Coronavirus HKU1 NOT DETECTED NOT DETECTED Final   Coronavirus NL63 NOT DETECTED NOT DETECTED Final   Coronavirus OC43 NOT DETECTED NOT DETECTED Final   Metapneumovirus NOT DETECTED NOT DETECTED Final   Rhinovirus / Enterovirus NOT DETECTED NOT DETECTED Final   Influenza A NOT DETECTED NOT DETECTED Final   Influenza B NOT DETECTED NOT DETECTED Final   Parainfluenza Virus 1 NOT DETECTED NOT DETECTED Final   Parainfluenza Virus 2 NOT DETECTED NOT DETECTED Final   Parainfluenza Virus 3 NOT DETECTED NOT DETECTED Final   Parainfluenza  Virus 4 NOT DETECTED NOT DETECTED Final   Respiratory Syncytial Virus NOT DETECTED NOT DETECTED Final   Bordetella pertussis NOT DETECTED NOT DETECTED Final   Chlamydophila pneumoniae NOT DETECTED NOT DETECTED Final   Mycoplasma pneumoniae NOT DETECTED NOT DETECTED Final  Culture, blood (Routine X 2)  w Reflex to ID Panel     Status: None   Collection Time: 05/29/18 11:40 AM  Result Value Ref Range Status   Specimen Description BLOOD RIGHT ANTECUBITAL  Final   Special Requests   Final    BOTTLES DRAWN AEROBIC ONLY Blood Culture adequate volume   Culture   Final    NO GROWTH 5 DAYS Performed at [facility], [address], [place], [place] [address]    Report Status 06/03/2018 FINAL  Final  Culture, blood (Routine X 2) w Reflex to ID Panel     Status: None (Preliminary result)   Collection Time: 05/30/18  8:05 PM  Result Value Ref Range Status   Specimen Description BLOOD RIGHT HAND  Final   Special Requests   Final    BOTTLES DRAWN AEROBIC ONLY Blood Culture adequate volume   Culture   Final    NO GROWTH 4 DAYS Performed at [facility], [place] [address], [place], [place] [address]    Report Status PENDING  Incomplete  Culture, blood (Routine X 2) w Reflex to ID Panel     Status: None (Preliminary result)   Collection Time: 05/30/18  8:10 PM  Result Value Ref Range Status   Specimen Description BLOOD RIGHT HAND  Final   Special Requests   Final    BOTTLES DRAWN AEROBIC ONLY Blood Culture adequate volume   Culture   Final    NO GROWTH 4 DAYS Performed at [facility], [place] [name], [place], [place] [address]    Report Status PENDING  Incomplete  Culture, blood (routine x 2)     Status: None (Preliminary result)   Collection Time: 06/03/18  3:25 PM  Result Value Ref Range Status   Specimen Description BLOOD BLOOD RIGHT HAND  Final   Special Requests   Final    BOTTLES DRAWN AEROBIC ONLY Blood Culture adequate volume   Culture   Final    NO GROWTH 3  DAYS Performed at [facility], [place] [address], [place], [place] [address]    Report Status PENDING  Incomplete  Culture, blood (routine x 2)     Status: None (Preliminary result)   Collection Time: 06/03/18  3:25 PM  Result Value Ref Range Status   Specimen Description BLOOD BLOOD RIGHT HAND  Final   Special Requests   Final    BOTTLES DRAWN AEROBIC ONLY Blood Culture results may not be optimal due to an inadequate volume of blood received in culture bottles   Culture   Final    NO GROWTH 3 DAYS Performed at [facility], [place] [address], [place], [place] [address]    Report Status PENDING  Incomplete  Culture, blood (routine x 2)     Status: None (Preliminary result)   Collection Time: 06/04/18  3:00 PM  Result Value Ref Range Status   Specimen Description BLOOD RIGHT HAND  Final   Special Requests   Final    BOTTLES DRAWN AEROBIC ONLY Blood Culture results may not be optimal due to an inadequate volume of blood received in culture bottles   Culture   Final    NO GROWTH 2 DAYS Performed at [facility], [place] [address], [place], [place] [address]    Report Status PENDING  Incomplete  Culture, blood (routine x 2)     Status: None (Preliminary result)   Collection Time: 06/04/18  3:05 PM  Result Value Ref Range Status  Specimen Description BLOOD RIGHT HAND  Final   Special Requests   Final    BOTTLES DRAWN AEROBIC ONLY Blood Culture results may not be optimal due to an inadequate volume of blood received in culture bottles   Culture   Final    NO GROWTH 2 DAYS Performed at [facility], [place] [address], [place], [place] [address]    Report Status PENDING  Incomplete    Corinne Ports, MD Internal Medicine PGY1 06/06/2018, 11:02 AM

## 2018-06-07 ENCOUNTER — Encounter (HOSPITAL_COMMUNITY): Payer: Medicare Other

## 2018-06-07 LAB — GLUCOSE, CAPILLARY
Glucose-Capillary: 142 mg/dL — ABNORMAL HIGH (ref 70–99)
Glucose-Capillary: 248 mg/dL — ABNORMAL HIGH (ref 70–99)
Glucose-Capillary: 213 mg/dL — ABNORMAL HIGH (ref 70–99)
Glucose-Capillary: 226 mg/dL — ABNORMAL HIGH (ref 70–99)

## 2018-06-07 NOTE — Progress Notes (Signed)
Daily Progress Note   Patient Name: Ricardo Watkins       Date: 06/07/2018 DOB: 01-14-1931  Age: 84 y.o. MRN#: [id number] Attending Physician: Lavina Hamman, MD Primary Care Physician: [name], MD Admit Date: 05/28/2018  Reason for Consultation/Follow-up: Establishing goals of care  Subjective: Patient awake, alert, oriented. Denies pain or discomfort. He was able to walk down the hall and back today without discomfort or shortness of breath.   Daughter and wife at bedside. Discussed course of hospitalization and plan of care. Discussed concern with fever being related to tumor. Family plans to make appointment to further discuss plan of care with [name] once he is discharged. They understand antibiotics have been changed per ID. They are hopeful to get him home before christmas. Discussed outpatient palliative referral. Family agreeable.   Patient happy this afternoon because his wife made him a coconut pie. He smiles and tells me her cooking is what will get him better. Therapeutic listening and emotional support provided. Family has PMT contact information and understands they can call team with questions or concerns.   Length of Stay: 10  Current Medications: Scheduled Meds:  . sodium chloride   Intravenous Once  . acetaminophen  650 mg Oral Once  . aspirin EC  81 mg Oral Daily  . feeding supplement (ENSURE ENLIVE)  237 mL Oral BID BM  . gabapentin  100 mg Oral QHS  . insulin aspart  0-5 Units Subcutaneous QHS  . insulin aspart  0-9 Units Subcutaneous TID WC  . insulin glargine  20 Units Subcutaneous q morning - 10a  . metoprolol succinate  50 mg Oral Daily  . multivitamin with minerals  1 tablet Oral q morning - 10a  . naphazoline-pheniramine  1 drop Both Eyes Daily    . polyethylene glycol  17 g Oral Daily  . pravastatin  40 mg Oral q1800  . senna-docusate  1 tablet Oral BID  . tamsulosin  0.4 mg Oral Daily    Continuous Infusions: . meropenem (MERREM) IV 1 g (06/07/18 0536)    PRN Meds: acetaminophen, guaiFENesin, ipratropium-albuterol, loperamide, neomycin-bacitracin-polymyxin, nitroGLYCERIN, ondansetron (ZOFRAN) IV, traMADol  Physical Exam Vitals signs and nursing note reviewed.  Constitutional:      General: He is awake.  HENT:     Head: Normocephalic and  atraumatic.  Cardiovascular:     Rate and Rhythm: Regular rhythm.  Pulmonary:     Effort: No tachypnea, accessory muscle usage or respiratory distress.  Skin:    General: Skin is warm.     Coloration: Skin is pale.  Neurological:     Mental Status: He is alert and oriented to person, place, and time.            Vital Signs: BP (!) 136/55   Pulse (!) 104   Temp (!) 97.4 F (36.3 C) (Oral)   Resp (!) 28   Ht 5\' 9"  (1.753 m)   Wt 83.4 kg   SpO2 94%   BMI 27.15 kg/m  SpO2: SpO2: 94 % O2 Device: O2 Device: Nasal Cannula O2 Flow Rate: O2 Flow Rate (L/min): 2 L/min  Intake/output summary:   Intake/Output Summary (Last 24 hours) at 06/07/2018 1341 Last data filed at 06/07/2018 5732 Gross per 24 hour  Intake 360 ml  Output 800 ml  Net -440 ml   LBM: Last BM Date: 06/06/18 Baseline Weight: Weight: 83.4 kg Most recent weight: Weight: 83.4 kg       Palliative Assessment/Data: PPS 50%   Flowsheet Rows     Most Recent Value  Intake Tab  Referral Department  Hospitalist  Unit at Time of Referral  ER  Palliative Care Primary Diagnosis  Cardiac  Date Notified  05/28/18  Palliative Care Type  New Palliative care  Reason for referral  End of Life Care Assistance  Date of Admission  05/28/18  # of days IP prior to Palliative referral  0  Clinical Assessment  Psychosocial & Spiritual Assessment  Palliative Care Outcomes      Patient Active Problem List   Diagnosis  Date Noted  . Lung mass   . Palliative care by specialist   . Community acquired pneumonia, bilateral   . CRF (chronic renal failure), stage 3 (moderate) ([place])   . Sepsis due to Enterococcus ([place])   . Bacteremia   . Palliative care encounter   . HCAP (healthcare-associated pneumonia) 05/28/2018  . Goals of care, counseling/discussion 05/28/2018  . FUO (fever of unknown origin) 05/01/2018  . Unstable angina ([place])   . Chronic systolic CHF (congestive heart failure) ([place])   . Acute on chronic combined systolic and diastolic CHF (congestive heart failure) ([place]) 04/22/2018  . Nonrheumatic aortic valve stenosis   . Chest pain in adult 04/20/2018  . Chronic neutropenia ([place]) 03/02/2018  . CHF (congestive heart failure) ([place]) 02/19/2018  . Upper airway cough syndrome 10/27/2017  . Abnormal CT of the chest 07/27/2017  . Flu-like symptoms 06/02/2017  . Elevated lactic acid level 06/02/2017  . Acute kidney injury superimposed on chronic kidney disease ([place]) 06/02/2017  . CKD (chronic kidney disease), stage IV ([place]) 12/16/2016  . Bilateral carotid artery stenosis 12/11/2016  . Dyslipidemia 12/11/2016  . Anemia in chronic kidney disease 07/01/2016  . Chronic ITP (idiopathic thrombocytopenia) ([facility]) 07/01/2016  . B12 deficiency anemia   . Chest tube in place   . Acute diastolic CHF (congestive heart failure) ([place]) 04/16/2016  . Acute respiratory failure with hypoxia ([place]) 04/14/2016  . Empyema lung ([place])   . Type 2 diabetes mellitus ([place]) 04/04/2016  . Empyema ([place]) 04/03/2016  . Thyroid nodule 03/03/2016  . Sepsis ([place]) 02/17/2016  . DOE (dyspnea on exertion) 01/27/2016  . Pulmonary edema with congestive heart failure ([place]) 11/12/2015  . Cough, persistent 11/04/2015  . Pleural effusion 11/04/2015  . Anorexia 08/08/2015  .  Microcytic anemia 07/07/2015  . Pancytopenia ([place]) 04/08/2015  . Diarrhea 04/08/2015  . Encounter for chemotherapy management 04/02/2015  . Neutropenia ([place])  02/18/2015  . Myelodysplasia (myelodysplastic syndrome) ([place]) 11/20/2014  . Deficiency anemia 04/19/2014  . Thrombocytopenia ([place]) 04/19/2014  . Vitamin B12 deficiency 04/19/2014  . Chronic renal failure, stage 3 (moderate) ([place]) 04/19/2014  . Macrocytic anemia 03/20/2013  . Monocytosis 03/20/2013  . Thrombotic stroke ([place]) 09/10/2011  . Prostate CA ([place]) 09/10/2011  . Inguinal hernia   . Colon polyps   . CAD (coronary artery disease) 08/26/2010  . Murmur 08/26/2010  . MURMUR 08/26/2010  . History of lymphoma 08/25/2010  . THROMBOCYTOPENIA 08/25/2010  . Essential hypertension 08/25/2010  . Coronary atherosclerosis 08/25/2010  . PVD 08/25/2010    Palliative Care Assessment & Plan   Patient Profile: 84 y.o. male  with past medical history of MDS, CKD3, HF with an EF of 30-35%, PVD s/p renal stenting, prostate cancer s/p radiation, and CAD no appropriate for invasive therapies - who was admitted on 05/28/2018 with chest pain and a productive cough.  He was found to have bacteremia and pneumonia.  Mr. Rzepka has had 13 ED visits in the last year and 4 hospital admissions in 3 months. CT chest with rounded dense mass like opacity in right upper lobe (pneumonia vs. Mass/cancer) and spiculated nodule in left upper lob.   Assessment: Sepsis Enterococcal bacteremia Right pleural effusion Myelodysplastic syndrome  CAD/chronic systolic & diastolic CHF AKI with underlying CKD  Recommendations/Plan:  Continue current plan of care and medical management. Patient/family speak of today being a great day. They are hopeful to get him home before Christmas. They plan to f/u with [name] outpatient once he is discharged.   Patient/family agreeable with outpatient palliative referral.   Code Status: DNR   Code Status Orders  (From admission, onward)         Start     Ordered   05/28/18 1016  Do not attempt resuscitation (DNR)  Continuous    Question Answer Comment  In the event of  cardiac or respiratory ARREST Do not call a "code blue"   In the event of cardiac or respiratory ARREST Do not perform Intubation, CPR, defibrillation or ACLS   In the event of cardiac or respiratory ARREST Use medication by any route, position, wound care, and other measures to relive pain and suffering. May use oxygen, suction and manual treatment of airway obstruction as needed for comfort.      05/28/18 1019        Code Status History    Date Active Date Inactive Code Status Order ID Comments User Context   05/01/2018 1604 05/03/2018 2216 DNR [id number]  Karmen Bongo, MD ED   04/20/2018 1557 04/22/2018 1712 Full Code [id number]  Caren Griffins, MD ED   02/19/2018 1540 02/21/2018 1658 Full Code [id number]  Cristal Deer, MD Inpatient   06/02/2017 1728 06/04/2017 1652 Partial Code [id number]  Radene Gunning, NP ED   06/02/2017 1612 06/02/2017 1728 Full Code [id number]  Radene Gunning, NP ED   04/04/2016 0123 04/20/2016 1808 Full Code [id number]  Reubin Milan, MD ED   02/17/2016 2248 02/21/2016 1811 Full Code [id number]  Norval Morton, MD Inpatient       Prognosis:  Poor prognosis  Discharge Planning:  Home with [place] was discussed with patient, family  Thank you for allowing the Palliative Medicine Team to assist in the care of this patient.  Time In: 1330 Time Out: 1348 Total Time 18 Prolonged Time Billed no       Greater than 50%  of this time was spent counseling and coordinating care related to the above assessment and plan.  Ihor Dow, FNP-C Palliative Medicine Team  Phone: [phone] Fax: [phone]  Please contact Palliative Medicine Team phone at [phone] for questions and concerns.

## 2018-06-07 NOTE — Progress Notes (Signed)
[facility] for Infectious Disease  Date of Admission:  05/28/2018   Total days of antibiotics 10        Unasyn: 12/9-12/17        Meropenem: 12/17- ASSESSMENT: Enterococcus faecium bacteremia with fevers despite therapy with Unasyn Multifocal PNA Right upper lobe lung mass versus rounded PNA  Switched from Unasyn to Meropenem 12/17. Patient feels and appears clinically improved today. Still unclear whether his fevers were due to ongoing pneumonia, possible lung malignancy, or drug fever, however he seems to be improved on the Meropenem for now. He was febrile to 100.4 this morning, but has otherwise been afebrile and his fever curve is trending down. Blood cx from 12/15 with no growth x3 days.  PLAN: 1. 5 day total course of Meropenem to end 12/21. He will then require Unasyn until 12/23 for his prior enterococcus bacteremia.   Principal Problem:   HCAP (healthcare-associated pneumonia) Active Problems:   CAD (coronary artery disease)   Essential hypertension   Chronic renal failure, stage 3 (moderate) ([facility])   Myelodysplasia (myelodysplastic syndrome) (HCC)   Pleural effusion   Sepsis (HCC)   Type 2 diabetes mellitus (HCC)   CHF (congestive heart failure) ([facility])   FUO (fever of unknown origin)   Goals of care, counseling/discussion   Bacteremia   Palliative care encounter   Sepsis due to Enterococcus ([place])   CRF (chronic renal failure), stage 3 (moderate) ([place])   Palliative care by specialist   Community acquired pneumonia, bilateral   Lung mass   Scheduled Meds: . sodium chloride   Intravenous Once  . acetaminophen  650 mg Oral Once  . aspirin EC  81 mg Oral Daily  . clopidogrel  75 mg Oral Daily  . feeding supplement (ENSURE ENLIVE)  237 mL Oral BID BM  . gabapentin  100 mg Oral QHS  . insulin aspart  0-5 Units Subcutaneous QHS  . insulin aspart  0-9 Units Subcutaneous TID WC  . insulin glargine  20 Units Subcutaneous q morning - 10a  . metoprolol  succinate  50 mg Oral Daily  . multivitamin with minerals  1 tablet Oral q morning - 10a  . naphazoline-pheniramine  1 drop Both Eyes Daily  . polyethylene glycol  17 g Oral Daily  . pravastatin  40 mg Oral q1800  . senna-docusate  1 tablet Oral BID  . tamsulosin  0.4 mg Oral Daily   Continuous Infusions: . meropenem (MERREM) IV 1 g (06/07/18 0536)   PRN Meds:.acetaminophen, guaiFENesin, ipratropium-albuterol, loperamide, neomycin-bacitracin-polymyxin, nitroGLYCERIN, ondansetron (ZOFRAN) IV, traMADol   SUBJECTIVE: Mr. Gumz is sitting up comfortably in bed today. He reports that he feels better than yesterday and was able to eat a good breakfast. He endorses ongoing nonproductive cough. He denies chills, shortness of breath, abdominal pain, and dysuria. He has no other concerns at this time.  Review of Systems: Review of Systems  Constitutional: Negative for chills and fever.  Respiratory: Positive for cough. Negative for sputum production.   Cardiovascular: Negative for chest pain.  Gastrointestinal: Negative for abdominal pain and constipation.  Genitourinary: Negative for dysuria.  Skin: Negative for rash.    Allergies  Allergen Reactions  . Glucophage [Metformin Hydrochloride] Other (See Comments)    Chest pain  . Zetia [Ezetimibe] Other (See Comments)    weakness  . Fenofibrate Rash  . Niacin-Lovastatin Er Rash    OBJECTIVE: Vitals:   06/06/18 5170 06/06/18 1718 06/06/18 2316 06/07/18 0532  BP: (!) 141/62 (!) 107/95 140/76   Pulse: 94 99 85   Resp: (!) 26 (!) 26 (!) 22   Temp: 99.9 F (37.7 C) 98.1 F (36.7 C) 98.4 F (36.9 C) (!) 100.4 F (38 C)  TempSrc: Axillary Oral Oral Rectal  SpO2: 95% (!) 80% 97%   Weight:      Height:       Body mass index is 27.15 kg/m.  Physical Exam Gen: Patient sitting up comfortably in bed. Alert and oriented. No distress. Lungs: Decreased breath sounds at bilateral bases. CV: Regular rate and rhythm. Systolic ejection  murmur 3/6. Abdomen: Bowel sounds present. Soft, non-distended, non-tender. Extremities: No lower extremity edema. Skin: No wounds or rashes.  Lab Results Lab Results  Component Value Date   WBC 5.0 06/06/2018   HGB 8.4 (L) 06/06/2018   HCT 28.4 (L) 06/06/2018   MCV 89.6 06/06/2018   PLT 57 (L) 06/06/2018    Lab Results  Component Value Date   CREATININE 1.99 (H) 06/06/2018   BUN 41 (H) 06/06/2018   NA 139 06/06/2018   K 5.3 (H) 06/06/2018   CL 103 06/06/2018   CO2 26 06/06/2018    Lab Results  Component Value Date   ALT 19 05/31/2018   AST 20 05/31/2018   ALKPHOS 55 05/31/2018   BILITOT 1.0 05/31/2018     Microbiology: Recent Results (from the past 240 hour(s))  Respiratory Panel by PCR     Status: None   Collection Time: 05/28/18 12:38 PM  Result Value Ref Range Status   Adenovirus NOT DETECTED NOT DETECTED Final   Coronavirus 229E NOT DETECTED NOT DETECTED Final   Coronavirus HKU1 NOT DETECTED NOT DETECTED Final   Coronavirus NL63 NOT DETECTED NOT DETECTED Final   Coronavirus OC43 NOT DETECTED NOT DETECTED Final   Metapneumovirus NOT DETECTED NOT DETECTED Final   Rhinovirus / Enterovirus NOT DETECTED NOT DETECTED Final   Influenza A NOT DETECTED NOT DETECTED Final   Influenza B NOT DETECTED NOT DETECTED Final   Parainfluenza Virus 1 NOT DETECTED NOT DETECTED Final   Parainfluenza Virus 2 NOT DETECTED NOT DETECTED Final   Parainfluenza Virus 3 NOT DETECTED NOT DETECTED Final   Parainfluenza Virus 4 NOT DETECTED NOT DETECTED Final   Respiratory Syncytial Virus NOT DETECTED NOT DETECTED Final   Bordetella pertussis NOT DETECTED NOT DETECTED Final   Chlamydophila pneumoniae NOT DETECTED NOT DETECTED Final   Mycoplasma pneumoniae NOT DETECTED NOT DETECTED Final  Culture, blood (Routine X 2) w Reflex to ID Panel     Status: None   Collection Time: 05/29/18 11:40 AM  Result Value Ref Range Status   Specimen Description BLOOD RIGHT ANTECUBITAL  Final   Special  Requests   Final    BOTTLES DRAWN AEROBIC ONLY Blood Culture adequate volume   Culture   Final    NO GROWTH 5 DAYS Performed at [facility], [address] [place], [place] [address]    Report Status 06/03/2018 FINAL  Final  Culture, blood (Routine X 2) w Reflex to ID Panel     Status: None   Collection Time: 05/30/18  8:05 PM  Result Value Ref Range Status   Specimen Description BLOOD RIGHT HAND  Final   Special Requests   Final    BOTTLES DRAWN AEROBIC ONLY Blood Culture adequate volume   Culture   Final    NO GROWTH 5 DAYS Performed at [facility], [place] [address], [place], [place]  [address]    Report Status 06/06/2018 FINAL  Final  Culture, blood (Routine X 2) w Reflex to ID Panel     Status: None   Collection Time: 05/30/18  8:10 PM  Result Value Ref Range Status   Specimen Description BLOOD RIGHT HAND  Final   Special Requests   Final    BOTTLES DRAWN AEROBIC ONLY Blood Culture adequate volume   Culture   Final    NO GROWTH 5 DAYS Performed at [facility], [place] [address], [place], [place] [address]    Report Status 06/06/2018 FINAL  Final  Culture, blood (routine x 2)     Status: None (Preliminary result)   Collection Time: 06/03/18  3:25 PM  Result Value Ref Range Status   Specimen Description BLOOD BLOOD RIGHT HAND  Final   Special Requests   Final    BOTTLES DRAWN AEROBIC ONLY Blood Culture adequate volume   Culture   Final    NO GROWTH 4 DAYS Performed at [facility], [place] [address], [place], [place] [address]    Report Status PENDING  Incomplete  Culture, blood (routine x 2)     Status: None (Preliminary result)   Collection Time: 06/03/18  3:25 PM  Result Value Ref Range Status   Specimen Description BLOOD BLOOD RIGHT HAND  Final   Special Requests   Final    BOTTLES DRAWN AEROBIC ONLY Blood Culture results may not be optimal due to an inadequate volume of blood received in culture bottles   Culture   Final    NO GROWTH 4  DAYS Performed at [facility], [place] [address], [place], [place] [address]    Report Status PENDING  Incomplete  Culture, blood (routine x 2)     Status: None (Preliminary result)   Collection Time: 06/04/18  3:00 PM  Result Value Ref Range Status   Specimen Description BLOOD RIGHT HAND  Final   Special Requests   Final    BOTTLES DRAWN AEROBIC ONLY Blood Culture results may not be optimal due to an inadequate volume of blood received in culture bottles   Culture   Final    NO GROWTH 3 DAYS Performed at [facility], [place] [address], [place], [place] [address]    Report Status PENDING  Incomplete  Culture, blood (routine x 2)     Status: None (Preliminary result)   Collection Time: 06/04/18  3:05 PM  Result Value Ref Range Status   Specimen Description BLOOD RIGHT HAND  Final   Special Requests   Final    BOTTLES DRAWN AEROBIC ONLY Blood Culture results may not be optimal due to an inadequate volume of blood received in culture bottles   Culture   Final    NO GROWTH 3 DAYS Performed at [facility], [place] [address], [place], [place] [address]    Report Status PENDING  Incomplete    Corinne Ports, [place] for [place] [phone] pager   [phone] cell 06/07/2018, 8:51 AM

## 2018-06-07 NOTE — Progress Notes (Signed)
[facility] Progress Note  Patient: Ricardo Watkins EGB:[id number]   PCP: [name], MD DOB: 1930/12/26   DOA: 05/28/2018   DOS: 06/07/2018   Date of Service: the patient was seen and examined on 06/07/2018  Brief hospital course: Pt. with PMH of PVD s/p R renal artery stent; remote prostate CA s/p seed radiation; NHL; HTN; HLD; DM; chronic ITP; CHF; and CAD s/p stent(recentlydeemed not an interventional candidate)h/o 4 admission in 12 months and 13 ED visits in 12 months; admitted on 05/28/2018, presented with complaint of chills and chest pain, was found to have pneumonia and enterococcus bacteremia.  S/P 2 PRBC on 05/29/2018 -ID following, now on IV Unasyn -CT chest with rounded dense masslike opacity in the right upper lobe, rounded pneumonia versus mass/cancer and a spiculated nodule in the left upper lobe as well -Underwent thoracentesis 12/14, continues to be frequently transfusion dependent due to advanced MDS -12/14 underwent thoracentesis, fluid was transudative -Was febrile again 12/15 into 12/16 a.m and 12/17  Subjective:   Fever is improving although the patient is using regular Tylenol as well as cooling blankets. No nausea no vomiting reported by the patient.  Assessment and Plan: 1.  Sepsis/enterococcal bacteremia -Presumed to be secondary to healthcare associated pneumonia, present on admission -Repeat x-ray 12/11 showed worsening opacity in the right upper lobe  -Blood cultures grew enterococcus faecium -Antibiotics transitioned back to Unasyn per infectious disease 12/11 -Infectious disease was consulted recommended 2-week course of IV Unasyn until 12/23, repeat cultures are negative, CT chest with right upper lobe lung mass versus rounded pneumonia no acute abdominal findings  -Unfortunately has had recurrent fevers for the last 72 hours, all repeat blood cultures are negative, chest x-ray with stable right lung pneumonia, CT abdomen done earlier this  admission was unremarkable While the CT scan of the chest does make a comment about possible right upper lobe mass it was not present on March 2019 CT scan of the chest thus less likely that the mass has grown up to this big size in the short time.  Although the possibility of that cannot be ruled out completely.  2.    Moderate right pleural effusion -Likely associated with right upper lobe pneumonia vs mass -Underwent thoracentesis in radiology 12/14 with 650 cc fluid drained appears transudate of, negative for malignancy cytology -Unfortunately cultures did not get sent   3. Myelodysplastic syndrome with 5 q. Deletion. -With symptomatic anemia, chronic neutropenia and thrombocytopenia -Transfused 2 units of PRBC for hemoglobin of 6.9 on 12/9, and then another unit of PRBC on 12/13 -On supportive care, transfuse as needed -He is also on Nplate and erythropoietin -Prognosis appears quite poor at this time, although discussed situation with Dr.Gorsuch, who agrees that he worsen with frequent hospitalizations, recurrent infections and potential need to discuss hospice, she will follow-up with him soon as outpatient Platelets are trending down for now less than 60 and therefore I will be holding aspirin and Plavix. If continues to trend down will formally consult hematology in the hospital.  3.  CAD/chronic systolic and diastolic CHF -Echocardiogram in 9/2 0 19 showed EF of 30 to 37% with diastolic dysfunction -Repeat echo now showed improvement in EF to 50%, no overt vegetations -Given IV Lasix intermittently -Continue aspirin, Plavix, Toprol, Imdur  4.  Acute kidney injury on chronic kidney disease stage IV. Baseline renal function around 1.8-1.7 -Creatinine was 2.4 on admission, trended down to 2.0, back up to 2.2 with worsening BUN as well -likely  due to ongoing sepsis -Diuretics were held in the setting of sepsis and high fevers -Diuretics restarted, use as needed  5. 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 Thrombocytopenia -due to MDS, see above, now trending down, hold aspirin Plavix.  7.  Goals of care discussion. -Multiple frequent hospitalizations -Seen by palliative care for goals of care discussion, Patient is DNR/DNI -Prognosis appears poor overall, discussed this with daughter at bedside, he would qualify for home hospice however would not be eligible for blood transfusions then, d/w Dr.Gorsuch 12/16, she will discuss poor prognosis and options with family post discharge with quick FU  Diet: regular diet thin DVT Prophylaxis: mechanical compression device, avoid heparin and lovenox  Advance goals of care discussion: DNR DNI  Family Communication: Granddaughter at bedside, discussed with daughter 12/16 Disposition: To be determined  Consultants: ID, Palliative care  Procedures: 12/14 thoracentesis, 650 cc of serous pleural fluid drained  echocardiogram   Scheduled Meds: . sodium chloride   Intravenous Once  . acetaminophen  650 mg Oral Once  . aspirin EC  81 mg Oral Daily  . feeding supplement (ENSURE ENLIVE)  237 mL Oral BID BM  . gabapentin  100 mg Oral QHS  . insulin aspart  0-5 Units Subcutaneous QHS  . insulin aspart  0-9 Units Subcutaneous TID WC  . insulin glargine  20 Units Subcutaneous q morning - 10a  . metoprolol succinate  50 mg Oral Daily  . multivitamin with minerals  1 tablet Oral q morning - 10a  . naphazoline-pheniramine  1 drop Both Eyes Daily  . polyethylene glycol  17 g Oral Daily  . pravastatin  40 mg Oral q1800  . senna-docusate  1 tablet Oral BID  . tamsulosin  0.4 mg Oral Daily   Continuous Infusions: . meropenem (MERREM) IV 1 g (06/07/18 1830)   PRN Meds: acetaminophen, guaiFENesin, ipratropium-albuterol, loperamide, neomycin-bacitracin-polymyxin, nitroGLYCERIN, ondansetron (ZOFRAN) IV,  traMADol Antibiotics: Anti-infectives (From admission, onward)   Start     Dose/Rate Route Frequency Ordered Stop   06/06/18 1700  meropenem (MERREM) 1 g in sodium chloride 0.9 % 100 mL IVPB     1 g 200 mL/hr over 30 Minutes Intravenous Every 12 hours 06/06/18 1623     05/31/18 1000  Ampicillin-Sulbactam (UNASYN) 3 g in sodium chloride 0.9 % 100 mL IVPB  Status:  Discontinued     3 g 200 mL/hr over 30 Minutes Intravenous Every 12 hours 05/31/18 0841 06/06/18 1606   05/30/18 1500  DAPTOmycin (CUBICIN) 670 mg in sodium chloride 0.9 % IVPB  Status:  Discontinued     670 mg 226.8 mL/hr over 30 Minutes Intravenous Every 48 hours 05/30/18 1327 05/31/18 0835   05/29/18 1400  Ampicillin-Sulbactam (UNASYN) 3 g in sodium chloride 0.9 % 100 mL IVPB  Status:  Discontinued     3 g 200 mL/hr over 30 Minutes Intravenous Every 12 hours 05/29/18 1059 05/30/18 1326   05/29/18 1000  vancomycin (VANCOCIN) IVPB 750 mg/150 ml premix  Status:  Discontinued     750 mg 150 mL/hr over 60 Minutes Intravenous Every 24 hours 05/28/18 1004 05/29/18 1052   05/28/18 2200  ceFEPIme (MAXIPIME) 1 g in sodium chloride 0.9 % 100 mL IVPB  Status:  Discontinued     1 g 200 mL/hr over 30 Minutes Intravenous Every 12 hours 05/28/18 1004 05/29/18 1052   05/28/18 0845  ceFEPIme (  MAXIPIME) 2 g in sodium chloride 0.9 % 100 mL IVPB     2 g 200 mL/hr over 30 Minutes Intravenous  Once 05/28/18 0834 05/28/18 0931   05/28/18 0845  vancomycin (VANCOCIN) IVPB 1000 mg/200 mL premix  Status:  Discontinued     1,000 mg 200 mL/hr over 60 Minutes Intravenous  Once 05/28/18 0834 05/28/18 0839   05/28/18 0845  vancomycin (VANCOCIN) 1,500 mg in sodium chloride 0.9 % 500 mL IVPB     1,500 mg 250 mL/hr over 120 Minutes Intravenous  Once 05/28/18 0839 05/28/18 1156       Objective: Physical Exam: Vitals:   06/06/18 2316 06/07/18 0532 06/07/18 0923 06/07/18 1712  BP: 140/76  (!) 136/55 (!) 136/59  Pulse: 85  (!) 104 (!) 102  Resp: (!) 22   (!) 28 (!) 30  Temp: 98.4 F (36.9 C) (!) 100.4 F (38 C) (!) 97.4 F (36.3 C) 98.3 F (36.8 C)  TempSrc: Oral Rectal Oral Oral  SpO2: 97%  94% 95%  Weight:      Height:        Intake/Output Summary (Last 24 hours) at 06/07/2018 1902 Last data filed at 06/07/2018 1700 Gross per 24 hour  Intake 770.46 ml  Output 800 ml  Net -29.54 ml   Filed Weights   05/28/18 0826  Weight: 83.4 kg   Gen: Awake, Alert, Oriented X 2, Ill appearing, frail elderly debilitated male, sitting up in bed HEENT: PERRLA, Neck supple, no JVD Lungs: decreased BS at both bases CVS: S1S2/RRR Abd: soft, Non tender, non distended, BS present Extremities: No edema Skin: no new rashes  Data Reviewed: CBC: Recent Labs  Lab 06/02/18 0254 06/03/18 0229 06/04/18 0247 06/05/18 0242 06/06/18 0213  WBC 5.2 4.4 5.5 5.4 5.0  HGB 7.2* 8.1* 7.9* 7.9* 8.4*  HCT 24.5* 27.5* 25.4* 26.5* 28.4*  MCV 86.9 88.4 86.7 88.3 89.6  PLT 95* 81* 78* 68* 57*   Basic Metabolic Panel: Recent Labs  Lab 06/02/18 0254 06/03/18 0229 06/04/18 0247 06/05/18 0242 06/06/18 0213  NA 139 140 140 141 139  K 3.6 3.8 3.7 4.9 5.3*  CL 105 107 104 104 103  CO2 24 22 24 25 26   GLUCOSE 132* 127* 121* 143* 139*  BUN 50* 45* 47* 44* 41*  CREATININE 2.20* 2.29* 2.08* 2.09* 1.99*  CALCIUM 8.0* 8.1* 7.8* 8.0* 8.4*    Liver Function Tests: Recent Labs  Lab 06/02/18 0254  PROT 6.3*   No results for input(s): LIPASE, AMYLASE in the last 168 hours. No results for input(s): AMMONIA in the last 168 hours. Coagulation Profile: No results for input(s): INR, PROTIME in the last 168 hours. Cardiac Enzymes: No results for input(s): CKTOTAL, CKMB, CKMBINDEX, TROPONINI in the last 168 hours. BNP (last 3 results) No results for input(s): PROBNP in the last 8760 hours. CBG: Recent Labs  Lab 06/06/18 1555 06/06/18 2109 06/07/18 0724 06/07/18 1200 06/07/18 1647  GLUCAP 194* 204* 142* 248* 213*   Studies: No results found.    Time spent: 35 minutes [place] Page via [email or web address] 06/07/2018 7:02 PM  Between 7PM-7AM, please contact night-coverage at [email or web address], password [facility]

## 2018-06-08 LAB — CBC WITH DIFFERENTIAL/PLATELET
Band Neutrophils: 0 %
Basophils Absolute: 0 10*3/uL (ref 0.0–0.1)
Basophils Relative: 0 %
Blasts: 0 %
Eosinophils Absolute: 0 10*3/uL (ref 0.0–0.5)
Eosinophils Relative: 0 %
HEMATOCRIT: 25.2 % — AB (ref 39.0–52.0)
HEMOGLOBIN: 7.7 g/dL — AB (ref 13.0–17.0)
Lymphocytes Relative: 35 %
Lymphs Abs: 1.5 10*3/uL (ref 0.7–4.0)
MCH: 27.4 pg (ref 26.0–34.0)
MCHC: 30.6 g/dL (ref 30.0–36.0)
MCV: 89.7 fL (ref 80.0–100.0)
MONOS PCT: 17 %
Metamyelocytes Relative: 0 %
Monocytes Absolute: 0.7 10*3/uL (ref 0.1–1.0)
Myelocytes: 0 %
NRBC: 0 /100{WBCs}
Neutro Abs: 2.1 10*3/uL (ref 1.7–7.7)
Neutrophils Relative %: 48 %
Other: 0 %
Platelets: 48 10*3/uL — ABNORMAL LOW (ref 150–400)
Promyelocytes Relative: 0 %
RBC: 2.81 MIL/uL — ABNORMAL LOW (ref 4.22–5.81)
RDW: 18.9 % — ABNORMAL HIGH (ref 11.5–15.5)
WBC: 4.3 10*3/uL (ref 4.0–10.5)
nRBC: 0 % (ref 0.0–0.2)

## 2018-06-08 LAB — COMPREHENSIVE METABOLIC PANEL
ALK PHOS: 64 U/L (ref 38–126)
ALT: 34 U/L (ref 0–44)
AST: 23 U/L (ref 15–41)
Albumin: 1.6 g/dL — ABNORMAL LOW (ref 3.5–5.0)
Anion gap: 10 (ref 5–15)
BILIRUBIN TOTAL: 0.5 mg/dL (ref 0.3–1.2)
BUN: 36 mg/dL — ABNORMAL HIGH (ref 8–23)
CO2: 28 mmol/L (ref 22–32)
Calcium: 8.2 mg/dL — ABNORMAL LOW (ref 8.9–10.3)
Chloride: 100 mmol/L (ref 98–111)
Creatinine, Ser: 1.74 mg/dL — ABNORMAL HIGH (ref 0.61–1.24)
GFR calc Af Amer: 41 mL/min — ABNORMAL LOW (ref >60)
GFR, EST NON AFRICAN AMERICAN: 35 mL/min — AB (ref >60)
Glucose, Bld: 135 mg/dL — ABNORMAL HIGH (ref 70–99)
Potassium: 4.4 mmol/L (ref 3.5–5.1)
Sodium: 138 mmol/L (ref 135–145)
Total Protein: 6.9 g/dL (ref 6.5–8.1)

## 2018-06-08 LAB — CULTURE, BLOOD (ROUTINE X 2)
Culture: NO GROWTH
Culture: NO GROWTH
Special Requests: ADEQUATE

## 2018-06-08 LAB — GLUCOSE, CAPILLARY
Glucose-Capillary: 113 mg/dL — ABNORMAL HIGH (ref 70–99)
Glucose-Capillary: 233 mg/dL — ABNORMAL HIGH (ref 70–99)
Glucose-Capillary: 229 mg/dL — ABNORMAL HIGH (ref 70–99)
Glucose-Capillary: 188 mg/dL — ABNORMAL HIGH (ref 70–99)

## 2018-06-08 LAB — MAGNESIUM: MAGNESIUM: 2.2 mg/dL (ref 1.7–2.4)

## 2018-06-08 NOTE — Progress Notes (Signed)
Physical Therapy Treatment Patient Details Name: Ricardo Watkins MRN: [id number] DOB: 10-03-1930 Today's Date: 06/08/2018    History of Present Illness 84 y.o. male admitted on 05/28/18 for weakness and cough.  Found to have sepsis due to HCAP with enterococcus bacteremia, myelodysplastic syndrome, symptomatic anemia s/p 2 Units PRBC, acute kidney injury on chronic kidney disease.  Pt with other significant PMH of DM 2, CKD, CAD, essential HTN, chronic combined diastolic and systolic CHF, chronic neutropenia, chronic thrombocytopenia, DVT, non  Hodgkin's lymphoma, stroke, and PVD.    PT Comments    Patient progressing well towards PT goals. Tolerated gait training today with close Min guard and max cues for RW navigation, proximity and safety. Sp02 dropped to 80% on 2L/min 02; requiring 1 longer seated rest break and >5 mins to recover while increasing 02 to 3L/min. Cues for pursed lip breathing as pt a mouth breather at baseline.  Encouraged walking 2 more times today with nursing. Pt will likely need home 02, does well on 3L/min 02 for mobility and 2L at rest. Will follow.   Follow Up Recommendations  Home health PT;Supervision for mobility/OOB     Equipment Recommendations  None recommended by PT    Recommendations for Other Services       Precautions / Restrictions Precautions Precautions: Fall Precaution Comments: monitor O2 sats Restrictions Weight Bearing Restrictions: No    Mobility  Bed Mobility Overal bed mobility: Needs Assistance Bed Mobility: Supine to Sit     Supine to sit: Min guard;HOB elevated Sit to supine: Min guard;HOB elevated   General bed mobility comments: increased time and effort, + bed rails  Transfers Overall transfer level: Needs assistance Equipment used: Rolling walker (2 wheeled) Transfers: Sit to/from Stand Sit to Stand: Min guard Stand pivot transfers: Min assist       General transfer comment: min guard for safety. Stood from [employer],  from chair x1. Cues for hand placement/technique.  Ambulation/Gait Ambulation/Gait assistance: Min guard Gait Distance (Feet): 100 Feet(x2 bouts) Assistive device: Rolling walker (2 wheeled) Gait Pattern/deviations: Step-through pattern;Decreased stride length;Trunk flexed Gait velocity: decreased   General Gait Details: Slow, mildly unsteady gait with RW too far anterior; cues for proximity and RW navigation as well as upright posture. Sp02 dropped to 80% on 2L/min 02. 1 long seated rest break >5 mins to recover. Able to maitnain Sp02 >90% on 3L/min02.   Stairs             Wheelchair Mobility    Modified Rankin (Stroke Patients Only)       Balance Overall balance assessment: Needs assistance Sitting-balance support: Feet supported;No upper extremity supported Sitting balance-Leahy Scale: Fair     Standing balance support: During functional activity;Bilateral upper extremity supported Standing balance-Leahy Scale: Poor Standing balance comment: requires use of RW                            Cognition Arousal/Alertness: Awake/alert Behavior During Therapy: WFL for tasks assessed/performed Overall Cognitive Status: Within Functional Limits for tasks assessed                                        Exercises      General Comments General comments (skin integrity, edema, etc.): Daughter and spouse present for session      Pertinent Vitals/Pain Pain Assessment: Faces Faces Pain Scale: Hurts  a little bit Pain Location: headache Pain Intervention(s): Monitored during session;Repositioned;Premedicated before session    Home Living                      Prior Function            PT Goals (current goals can now be found in the care plan section) Acute Rehab PT Goals Patient Stated Goal: to go home and see his dog, Spanky Progress towards PT goals: Progressing toward goals    Frequency    Min 3X/week      PT Plan  Current plan remains appropriate    Co-evaluation              AM-PAC PT "6 Clicks" Mobility   Outcome Measure  Help needed turning from your back to your side while in a flat bed without using bedrails?: A Little Help needed moving from lying on your back to sitting on the side of a flat bed without using bedrails?: A Little Help needed moving to and from a bed to a chair (including a wheelchair)?: A Little Help needed standing up from a chair using your arms (e.g., wheelchair or bedside chair)?: None Help needed to walk in hospital room?: A Little Help needed climbing 3-5 steps with a railing? : A Lot 6 Click Score: 18    End of Session Equipment Utilized During Treatment: Gait belt;Oxygen Activity Tolerance: Treatment limited secondary to medical complications (Comment);Patient tolerated treatment well(drop in Sp02) Patient left: in chair;with call bell/phone within reach;with family/visitor present Nurse Communication: Mobility status PT Visit Diagnosis: Muscle weakness (generalized) (M62.81);Difficulty in walking, not elsewhere classified (R26.2)     Time: 5300-5110 PT Time Calculation (min) (ACUTE ONLY): 28 min  Charges:  $Gait Training: 8-22 mins $Therapeutic Exercise: 8-22 mins                     Wray Kearns, [place], DPT Acute Rehabilitation Services Pager [phone] Office [place] 06/08/2018, 10:29 AM

## 2018-06-08 NOTE — Progress Notes (Signed)
SATURATION QUALIFICATIONS: (This note is used to comply with regulatory documentation for home oxygen)  Patient Saturations on Room Air at Rest = NA as pt's Sp02 89% on 2L/min 02  Patient Saturations on Room Air while Ambulating = NA as pt's Sp02 89% on 2L/min 02  Patient Saturations on 2 Liters of oxygen while Ambulating = 80%  Please briefly explain why patient needs home oxygen: Pt not able to maintain 02 sats without supplemental 02.   Wray Kearns, PT, DPT Acute Rehabilitation Services Pager [phone] Office [phone]

## 2018-06-08 NOTE — Progress Notes (Signed)
SATURATION QUALIFICATIONS: (This note is used to comply with regulatory documentation for home oxygen)  Patient Saturations on Room Air at Rest = 86%  Patient Saturations on 2L while Ambulating = 80%  Patient Saturations on 3Liters of oxygen while Ambulating = 91%  Please briefly explain why patient needs home oxygen:

## 2018-06-08 NOTE — Care Management Note (Addendum)
Case Management Note  Patient Details  Name: BRANDOM KERWIN MRN: [id number] Date of Birth: 07/26/1930  Subjective/Objective:   NCM spoke with wife and grandson at bedside, offered choice, they chose [facility] for [facility],  [place], [place] and Social Work.  Referral given to [facility] with [facility] .  Soc will begin 24-48hrs post dc.   Patient will need outpatient palliative services, wife chose [facility], referral given to [facility] with [facility].  They will contact wife in patient room to discuss palliative services.   Patient will also need home oxygen prior to dc.                             Action/Plan: NCM will follow for transition of care needs. Will need HH orders with face to face and oxygen orders prior to dc.   Expected Discharge Date:                  Expected Discharge Plan:  [place]  In-House Referral:     Discharge planning Services  CM Consult  Post Acute Care Choice:  Home Health Choice offered to:  Spouse, Adult Children  DME Arranged:   [facility]  DME Agency:   [facility]  HH Arranged:  PT, RN, OT, Social Work [employer] Agency:  [facility]  Status of Service:  In process, will continue to follow  If discussed at Long Length of Stay Meetings, dates discussed:    Additional Comments:  Zenon Mayo, RN 06/08/2018, 12:28 PM

## 2018-06-08 NOTE — Progress Notes (Signed)
[facility] Progress Note  Patient: Ricardo Watkins TKW:[id number]   PCP: [name], MD DOB: 1930-09-06   DOA: 05/28/2018   DOS: 06/08/2018   Date of Service: the patient was seen and examined on 06/08/2018  Brief hospital course: Pt. with PMH of PVD s/p R renal artery stent; remote prostate CA s/p seed radiation; NHL; HTN; HLD; DM; chronic ITP; CHF; and CAD s/p stent(recentlydeemed not an interventional candidate)h/o 4 admission in 12 months and 13 ED visits in 12 months; admitted on 05/28/2018, presented with complaint of chills and chest pain, was found to have pneumonia and enterococcus bacteremia.  S/P 2 PRBC on 05/29/2018 -ID following, now on IV Unasyn -CT chest with rounded dense masslike opacity in the right upper lobe, rounded pneumonia versus mass/cancer and a spiculated nodule in the left upper lobe as well -Underwent thoracentesis 12/14, continues to be frequently transfusion dependent due to advanced MDS -12/14 underwent thoracentesis, fluid was transudative -Was febrile again 12/15 into 12/16 a.m and 12/17  Subjective:   No acute complaint no nausea no vomiting.  Assessment and Plan: 1.  Sepsis/enterococcal bacteremia -Presumed to be secondary to healthcare associated pneumonia, present on admission -Repeat x-ray 12/11 showed worsening opacity in the right upper lobe  -Blood cultures grew enterococcus faecium -Antibiotics transitioned back to Unasyn per infectious disease 12/11 -Infectious disease was consulted recommended 2-week course of IV Unasyn until 12/23, repeat cultures are negative, CT chest with right upper lobe lung mass versus rounded pneumonia no acute abdominal findings  -Unfortunately has had recurrent fevers for the last 72 hours, all repeat blood cultures are negative, chest x-ray with stable right lung pneumonia, CT abdomen done earlier this admission was unremarkable While the CT scan of the chest does make a comment about possible right upper  lobe mass it was not present on March 2019 CT scan of the chest thus less likely that the mass has grown up to this big size in the short time.  Although the possibility of that cannot be ruled out completely. Appreciate ID input.  Currently signed off.  We will continue antibiotic until June 12, 2018.  2.    Moderate right pleural effusion -Likely associated with right upper lobe pneumonia vs mass -Underwent thoracentesis in radiology 12/14 with 650 cc fluid drained appears transudate of, negative for malignancy cytology -Unfortunately cultures did not get sent   3. Myelodysplastic syndrome with 5 q. Deletion. -With symptomatic anemia, chronic neutropenia and thrombocytopenia -Transfused 2 units of PRBC for hemoglobin of 6.9 on 12/9, and then another unit of PRBC on 12/13 -On supportive care, transfuse as needed -He is also on Nplate and erythropoietin -Prognosis appears quite poor at this time, although discussed situation with Dr.Gorsuch, who agrees that he worsen with frequent hospitalizations, recurrent infections and potential need to discuss hospice, she will follow-up with him soon as outpatient Platelets are trending down for now less than 60 and therefore I will be holding aspirin and Plavix. If continues to trend down will formally consult hematology in the hospital.  3.  CAD/chronic systolic and diastolic CHF -Echocardiogram in 9/2 0 19 showed EF of 30 to 92% with diastolic dysfunction -Repeat echo now showed improvement in EF to 50%, no overt vegetations -Given IV Lasix intermittently -Continue aspirin, Plavix, Toprol, Imdur  4.  Acute kidney injury on chronic kidney disease stage IV. Baseline renal function around 1.8-1.7 -Creatinine was 2.4 on admission, trended down to 2.0, back up to 2.2 with worsening BUN as well -likely  due to ongoing sepsis -Diuretics were held in the setting of sepsis and high fevers -Diuretics restarted, use as needed  5. Type 2 Diabetes  Mellitus, uncontroled with renal complication and neuropathic complication -continue Lantus and sliding scale -Hb A1c is 7.7 -CBGs are stable  6. Thrombocytopenia -due to MDS, see above, now trending down, hold aspirin Plavix.  7.  Goals of care discussion. -Multiple frequent hospitalizations -Seen by palliative care for goals of care discussion, Patient is DNR/DNI -Prognosis appears poor overall, discussed this with daughter at bedside, he would qualify for home hospice however would not be eligible for blood transfusions then, d/w Dr.Gorsuch 12/16, she will discuss poor prognosis and options with family post discharge with quick FU  Diet: regular diet thin DVT Prophylaxis: mechanical compression device, avoid heparin and lovenox  Advance goals of care discussion: DNR DNI  Family Communication: Granddaughter at bedside, discussed with daughter 12/16 Disposition: To be determined  Consultants: ID, Palliative care  Procedures: 12/14 thoracentesis, 650 cc of serous pleural fluid drained  echocardiogram   Scheduled Meds: . sodium chloride   Intravenous Once  . acetaminophen  650 mg Oral Once  . feeding supplement (ENSURE ENLIVE)  237 mL Oral BID BM  . gabapentin  100 mg Oral QHS  . insulin aspart  0-5 Units Subcutaneous QHS  . insulin aspart  0-9 Units Subcutaneous TID WC  . insulin glargine  20 Units Subcutaneous q morning - 10a  . metoprolol succinate  50 mg Oral Daily  . multivitamin with minerals  1 tablet Oral q morning - 10a  . naphazoline-pheniramine  1 drop Both Eyes Daily  . polyethylene glycol  17 g Oral Daily  . pravastatin  40 mg Oral q1800  . senna-docusate  1 tablet Oral BID  . tamsulosin  0.4 mg Oral Daily   Continuous Infusions: . meropenem (MERREM) IV 1 g (06/08/18 1743)   PRN Meds: acetaminophen, guaiFENesin, ipratropium-albuterol, loperamide, neomycin-bacitracin-polymyxin, nitroGLYCERIN, ondansetron (ZOFRAN) IV, traMADol Antibiotics: Anti-infectives  (From admission, onward)   Start     Dose/Rate Route Frequency Ordered Stop   06/06/18 1700  meropenem (MERREM) 1 g in sodium chloride 0.9 % 100 mL IVPB     1 g 200 mL/hr over 30 Minutes Intravenous Every 12 hours 06/06/18 1623 06/12/18 2359   05/31/18 1000  Ampicillin-Sulbactam (UNASYN) 3 g in sodium chloride 0.9 % 100 mL IVPB  Status:  Discontinued     3 g 200 mL/hr over 30 Minutes Intravenous Every 12 hours 05/31/18 0841 06/06/18 1606   05/30/18 1500  DAPTOmycin (CUBICIN) 670 mg in sodium chloride 0.9 % IVPB  Status:  Discontinued     670 mg 226.8 mL/hr over 30 Minutes Intravenous Every 48 hours 05/30/18 1327 05/31/18 0835   05/29/18 1400  Ampicillin-Sulbactam (UNASYN) 3 g in sodium chloride 0.9 % 100 mL IVPB  Status:  Discontinued     3 g 200 mL/hr over 30 Minutes Intravenous Every 12 hours 05/29/18 1059 05/30/18 1326   05/29/18 1000  vancomycin (VANCOCIN) IVPB 750 mg/150 ml premix  Status:  Discontinued     750 mg 150 mL/hr over 60 Minutes Intravenous Every 24 hours 05/28/18 1004 05/29/18 1052   05/28/18 2200  ceFEPIme (MAXIPIME) 1 g in sodium chloride 0.9 % 100 mL IVPB  Status:  Discontinued     1 g 200 mL/hr over 30 Minutes Intravenous Every 12 hours 05/28/18 1004 05/29/18 1052   05/28/18 0845  ceFEPIme (MAXIPIME) 2 g in sodium chloride 0.9 % 100  mL IVPB     2 g 200 mL/hr over 30 Minutes Intravenous  Once 05/28/18 0834 05/28/18 0931   05/28/18 0845  vancomycin (VANCOCIN) IVPB 1000 mg/200 mL premix  Status:  Discontinued     1,000 mg 200 mL/hr over 60 Minutes Intravenous  Once 05/28/18 0834 05/28/18 0839   05/28/18 0845  vancomycin (VANCOCIN) 1,500 mg in sodium chloride 0.9 % 500 mL IVPB     1,500 mg 250 mL/hr over 120 Minutes Intravenous  Once 05/28/18 0839 05/28/18 1156       Objective: Physical Exam: Vitals:   06/08/18 0021 06/08/18 0725 06/08/18 1400 06/08/18 1719  BP: 140/64 (!) 145/67  (!) 130/58  Pulse: 87 88  89  Resp: 20 20  18   Temp: (!) 97.5 F (36.4 C) 98.4  F (36.9 C) 98.2 F (36.8 C) 97.7 F (36.5 C)  TempSrc: Oral Oral Oral Oral  SpO2: 98% 98%  98%  Weight:      Height:        Intake/Output Summary (Last 24 hours) at 06/08/2018 2123 Last data filed at 06/08/2018 1900 Gross per 24 hour  Intake 588.68 ml  Output 1250 ml  Net -661.32 ml   Filed Weights   05/28/18 0826  Weight: 83.4 kg   Gen: Awake, Alert, Oriented X 2, Ill appearing, frail elderly debilitated male, sitting up in bed HEENT: PERRLA, Neck supple, no JVD Lungs: decreased BS at both bases CVS: S1S2/RRR Abd: soft, Non tender, non distended, BS present Extremities: No edema Skin: no new rashes  Data Reviewed: CBC: Recent Labs  Lab 06/03/18 0229 06/04/18 0247 06/05/18 0242 06/06/18 0213 06/08/18 0229  WBC 4.4 5.5 5.4 5.0 4.3  NEUTROABS  --   --   --   --  2.1  HGB 8.1* 7.9* 7.9* 8.4* 7.7*  HCT 27.5* 25.4* 26.5* 28.4* 25.2*  MCV 88.4 86.7 88.3 89.6 89.7  PLT 81* 78* 68* 57* 48*   Basic Metabolic Panel: Recent Labs  Lab 06/03/18 0229 06/04/18 0247 06/05/18 0242 06/06/18 0213 06/08/18 0229  NA 140 140 141 139 138  K 3.8 3.7 4.9 5.3* 4.4  CL 107 104 104 103 100  CO2 22 24 25 26 28   GLUCOSE 127* 121* 143* 139* 135*  BUN 45* 47* 44* 41* 36*  CREATININE 2.29* 2.08* 2.09* 1.99* 1.74*  CALCIUM 8.1* 7.8* 8.0* 8.4* 8.2*  MG  --   --   --   --  2.2    Liver Function Tests: Recent Labs  Lab 06/02/18 0254 06/08/18 0229  AST  --  23  ALT  --  34  ALKPHOS  --  64  BILITOT  --  0.5  PROT 6.3* 6.9  ALBUMIN  --  1.6*   No results for input(s): LIPASE, AMYLASE in the last 168 hours. No results for input(s): AMMONIA in the last 168 hours. Coagulation Profile: No results for input(s): INR, PROTIME in the last 168 hours. Cardiac Enzymes: No results for input(s): CKTOTAL, CKMB, CKMBINDEX, TROPONINI in the last 168 hours. BNP (last 3 results) No results for input(s): PROBNP in the last 8760 hours. CBG: Recent Labs  Lab 06/07/18 2115 06/08/18 0726  06/08/18 1139 06/08/18 1720 06/08/18 2108  GLUCAP 226* 113* 233* 229* 188*   Studies: No results found.   Time spent: 35 minutes [place] Page via [email or web address] 06/08/2018 9:23 PM  Between 7PM-7AM, please contact night-coverage at [email or web address], password [facility]

## 2018-06-08 NOTE — Care Management Important Message (Signed)
Important Message  Patient Details  Name: Ricardo Watkins MRN: [id number] Date of Birth: 02-01-1931   Medicare Important Message Given:  Yes Patient declined another copy    [name] 06/08/2018, 2:07 PM

## 2018-06-08 NOTE — Progress Notes (Signed)
Hospice and [facility]) Hospital Liaison note.  Received request to Tomi Bamberger, RN CM for family interest in palliative care services for patient after discharge. Visited with wife Geni Bers at bedside to explain Palliative services. Provided [facility] Contact information and educational materials for her to review with family.    Please call with any additional questions or concerns regarding palliative services with [facility].   Thank you,  Farrel Gordon, RN, [facility] Liaison (listed on [place]) [phone]

## 2018-06-08 NOTE — Progress Notes (Signed)
Occupational Therapy Treatment Patient Details Name: Ricardo Watkins MRN: [id number] DOB: 17-Jan-1931 Today's Date: 06/08/2018    History of present illness 84 y.o. male admitted on 05/28/18 for weakness and cough.  Found to have sepsis due to HCAP with enterococcus bacteremia, myelodysplastic syndrome, symptomatic anemia s/p 2 Units PRBC, acute kidney injury on chronic kidney disease.  Pt with other significant PMH of DM 2, CKD, CAD, essential HTN, chronic combined diastolic and systolic CHF, chronic neutropenia, chronic thrombocytopenia, DVT, non  Hodgkin's lymphoma, stroke, and PVD.   OT comments  Pt progressing towards acute OT goals. Focus of session was toilet transfer, working on activity tolerance towards increased independence with ADL goals, and functional transfers with rw. Pt on 2L supplemental O2 via [place] throughout session. At end of session, resting in bed, HR was 103, O2 92. Encouraged in through nose, out through mouth breathing technique. Family reports they have a lot of assistance at home. D/c plan updated to home with [facility] therapies.    Follow Up Recommendations  Home health OT;Supervision/Assistance - 24 hour    Equipment Recommendations  3 in 1 bedside commode    Recommendations for Other Services      Precautions / Restrictions Precautions Precautions: Fall Precaution Comments: monitor O2 sats Restrictions Weight Bearing Restrictions: No       Mobility Bed Mobility Overal bed mobility: Needs Assistance Bed Mobility: Sit to Supine       Sit to supine: Min guard;HOB elevated   General bed mobility comments: increased time and effort, + bed rails  Transfers Overall transfer level: Needs assistance Equipment used: Rolling walker (2 wheeled) Transfers: Sit to/from [facility] Sit to Stand: Min assist Stand pivot transfers: Min assist       General transfer comment: to/from BSC and EOB. Cues for hand placement during transition. Light  steadying assist    Balance Overall balance assessment: Needs assistance Sitting-balance support: Feet supported;Bilateral upper extremity supported Sitting balance-Leahy Scale: Fair     Standing balance support: Bilateral upper extremity supported;During functional activity Standing balance-Leahy Scale: Poor Standing balance comment: requires use of RW                           ADL either performed or assessed with clinical judgement   ADL Overall ADL's : Needs assistance/impaired                         Toilet Transfer: Minimal assistance;Stand-pivot;RW;BSC Toilet Transfer Details (indicate cue type and reason): spouse in front of pt with pt's hands on her shoulders for support Toileting- Clothing Manipulation and Hygiene: Maximal assistance;Sit to/from stand Toileting - Clothing Manipulation Details (indicate cue type and reason): pt stood with BUE support while staff completed pericare       General ADL Comments: Pt standing at [facility] completing pericare in standing as detailed above upon arrival of OT. Pt then completed SPT to EOB. 3x sit<>stands working on hand placement. Pt walking in place a bit each time in standing.      Vision       Perception     Praxis      Cognition Arousal/Alertness: Awake/alert Behavior During Therapy: WFL for tasks assessed/performed Overall Cognitive Status: Within Functional Limits for tasks assessed  Exercises     Shoulder Instructions       General Comments daughter and spouse present and involvedthroughout session    Pertinent Vitals/ Pain       Pain Assessment: Faces Faces Pain Scale: Hurts a little bit Pain Location: unspecified Pain Intervention(s): Monitored during session  Home Living                                          Prior Functioning/Environment              Frequency  Min 2X/week        Progress  Toward Goals  OT Goals(current goals can now be found in the care plan section)  Progress towards OT goals: Progressing toward goals  Acute Rehab OT Goals Patient Stated Goal: to go home and see his dog, Spanky OT Goal Formulation: With patient Time For Goal Achievement: 06/15/18 Potential to Achieve Goals: Good ADL Goals Pt Will Perform Eating: with set-up;sitting Pt Will Perform Grooming: with set-up;sitting Pt Will Perform Upper Body Bathing: with min assist;sitting Pt Will Perform Upper Body Dressing: with min assist;sitting Pt Will Transfer to Toilet: with min guard assist;ambulating;bedside commode Additional ADL Goal #1: pt will perform bed mobility with min guard assist in preparation for ADL.  Plan Discharge plan needs to be updated    Co-evaluation                 AM-PAC OT "6 Clicks" Daily Activity     Outcome Measure   Help from another person eating meals?: A Little Help from another person taking care of personal grooming?: A Lot Help from another person toileting, which includes using toliet, bedpan, or urinal?: A Lot Help from another person bathing (including washing, rinsing, drying)?: A Lot Help from another person to put on and taking off regular upper body clothing?: A Little Help from another person to put on and taking off regular lower body clothing?: A Lot 6 Click Score: 14    End of Session Equipment Utilized During Treatment: Gait belt;Rolling walker;Oxygen(2L via [place])  OT Visit Diagnosis: Unsteadiness on feet (R26.81);Other abnormalities of gait and mobility (R26.89);Other symptoms and signs involving cognitive function;History of falling (Z91.81);Muscle weakness (generalized) (M62.81)   Activity Tolerance Patient tolerated treatment well   Patient Left in bed;with call bell/phone within reach;with family/visitor present   Nurse Communication Other (comment)(HR 103, O2 92 on 2L O2)        Time: 7371-0626 OT Time Calculation (min): 22  min  Charges: OT General Charges $OT Visit: 1 Visit OT Treatments $Self Care/Home Management : 8-22 mins  Tyrone Schimke, OT Acute Rehabilitation Services Pager: [phone] Office: [phone]    Hortencia Pilar 06/08/2018, 9:28 AM

## 2018-06-08 NOTE — Progress Notes (Signed)
Pharmacy Antibiotic Note  Ricardo Watkins is a 84 y.o. male admitted on 05/28/2018 with enterococcus faecium bacteremia. Pt was on Unasyn before with some fevers. ID came back and saw the pt. Questioned if the fevers came from the lung mass or drug fever. Abx was changed to [place]. Plan is to continue til 12/23. All repeat cultures have been neg. Scr 1.74>>CrCl ~31. Due to his age and crcl, there won't be much change to his abx dosing. Rx will sign off. Stop date has been added.  Plan: Meropenem 1 gm IV every 12 hours til 12/23 Rx sign off  Height: 5\' 9"  (175.3 cm) Weight: 183 lb 13.8 oz (83.4 kg) IBW/kg (Calculated) : 70.7  Temp (24hrs), Avg:97.9 F (36.6 C), Min:97.4 F (36.3 C), Max:98.4 F (36.9 C)  Recent Labs  Lab 06/03/18 0229 06/04/18 0247 06/05/18 0242 06/06/18 0213 06/08/18 0229  WBC 4.4 5.5 5.4 5.0 4.3  CREATININE 2.29* 2.08* 2.09* 1.99* 1.74*    Estimated Creatinine Clearance: 31 mL/min (A) (by C-G formula based on SCr of 1.74 mg/dL (H)).    Allergies  Allergen Reactions  . Glucophage [Metformin Hydrochloride] Other (See Comments)    Chest pain  . Zetia [Ezetimibe] Other (See Comments)    weakness  . Fenofibrate Rash  . Niacin-Lovastatin Er Rash    Antimicrobials this admission: Meropenem 12/17 >>12/23 Unasyn 12/9>>12/10, 12/11>>12/17 Daptomycin 12/11>>12/10 Vanc 12/8>>12/9 Cefepime 12/8>> 12/9  Microbiology results: 12/10 BCx: NGTD 12/8 BCx: Enterococcus faecium (S- amp, vanc) 12/9 BCx: NGTD 12/14 blood>>neg 12/15 blood>>neg  Onnie Boer, PharmD, BCIDP, AAHIVP, CPP Infectious Disease Pharmacist 06/08/2018 8:17 AM

## 2018-06-09 ENCOUNTER — Telehealth: Payer: Self-pay | Admitting: Hematology and Oncology

## 2018-06-09 LAB — CBC WITH DIFFERENTIAL/PLATELET
ABS IMMATURE GRANULOCYTES: 0.1 10*3/uL — AB (ref 0.00–0.07)
BAND NEUTROPHILS: 3 %
Basophils Absolute: 0 10*3/uL (ref 0.0–0.1)
Basophils Relative: 0 %
Blasts: 12 %
Eosinophils Absolute: 0 10*3/uL (ref 0.0–0.5)
Eosinophils Relative: 1 %
HCT: 27.1 % — ABNORMAL LOW (ref 39.0–52.0)
Hemoglobin: 8.2 g/dL — ABNORMAL LOW (ref 13.0–17.0)
Lymphocytes Relative: 37 %
Lymphs Abs: 1.3 10*3/uL (ref 0.7–4.0)
MCH: 27.1 pg (ref 26.0–34.0)
MCHC: 30.3 g/dL (ref 30.0–36.0)
MCV: 89.4 fL (ref 80.0–100.0)
MONO ABS: 0.4 10*3/uL (ref 0.1–1.0)
MONOS PCT: 10 %
Metamyelocytes Relative: 2 %
Myelocytes: 1 %
Neutro Abs: 1.3 10*3/uL — ABNORMAL LOW (ref 1.7–7.7)
Neutrophils Relative %: 33 %
Platelets: 59 10*3/uL — ABNORMAL LOW (ref 150–400)
Promyelocytes Relative: 1 %
RBC: 3.03 MIL/uL — ABNORMAL LOW (ref 4.22–5.81)
RDW: 18.6 % — ABNORMAL HIGH (ref 11.5–15.5)
WBC: 3.6 10*3/uL — ABNORMAL LOW (ref 4.0–10.5)
nRBC: 0 % (ref 0.0–0.2)

## 2018-06-09 LAB — CULTURE, BLOOD (ROUTINE X 2)
CULTURE: NO GROWTH
Culture: NO GROWTH

## 2018-06-09 LAB — GLUCOSE, CAPILLARY
Glucose-Capillary: 112 mg/dL — ABNORMAL HIGH (ref 70–99)
Glucose-Capillary: 190 mg/dL — ABNORMAL HIGH (ref 70–99)
Glucose-Capillary: 210 mg/dL — ABNORMAL HIGH (ref 70–99)
Glucose-Capillary: 175 mg/dL — ABNORMAL HIGH (ref 70–99)

## 2018-06-09 NOTE — Progress Notes (Signed)
Physical Therapy Treatment Patient Details Name: Ricardo Watkins MRN: [id number] DOB: 08-28-1930 Today's Date: 06/09/2018    History of Present Illness 84 y.o. male admitted on 05/28/18 for weakness and cough.  Found to have sepsis due to HCAP with enterococcus bacteremia, myelodysplastic syndrome, symptomatic anemia s/p 2 Units PRBC, acute kidney injury on chronic kidney disease.  Pt with other significant PMH of DM 2, CKD, CAD, essential HTN, chronic combined diastolic and systolic CHF, chronic neutropenia, chronic thrombocytopenia, DVT, non  Hodgkin's lymphoma, stroke, and PVD.    PT Comments    Patient progressing well with therapy today, increasing ambulation distance on 2L. Now walked 200' without a rest break, DOE 1/4 in last several feet of session, SpO2 88%, returns quickly to 92% with rest, remained in 90's the majority of gait distance. Family present very supportive and capable of safe guarding with transfers and walking with RW. Cont to rec HHPT at this time.      Follow Up Recommendations  Home health PT;Supervision for mobility/OOB     Equipment Recommendations  None recommended by PT    Recommendations for Other Services OT consult     Precautions / Restrictions Precautions Precautions: Fall Precaution Comments: monitor O2 sats Restrictions Weight Bearing Restrictions: No    Mobility  Bed Mobility Overal bed mobility: Needs Assistance Bed Mobility: Supine to Sit     Supine to sit: Min guard;HOB elevated Sit to supine: Min guard;HOB elevated   General bed mobility comments: increased time and effort, + bed rails  Transfers Overall transfer level: Needs assistance Equipment used: Rolling walker (2 wheeled) Transfers: Sit to/from Stand Sit to Stand: Min guard         General transfer comment: min guard for safety, patients family assisting with good safety  Ambulation/Gait Ambulation/Gait assistance: Min Gaffer (Feet): 200  Feet(without rest break) Assistive device: Rolling walker (2 wheeled) Gait Pattern/deviations: Step-through pattern;Decreased stride length;Trunk flexed Gait velocity: decreased   General Gait Details: pt increased distance, 200' beocming DOE 1/4 towards end. 2L satting in 90s most of session until last several feet to 88% returned Forensic scientist    Modified Rankin (Stroke Patients Only)       Balance Overall balance assessment: Needs assistance Sitting-balance support: Feet supported;No upper extremity supported Sitting balance-Leahy Scale: Fair     Standing balance support: During functional activity;Bilateral upper extremity supported Standing balance-Leahy Scale: Poor Standing balance comment: requires use of RW                            Cognition Arousal/Alertness: Awake/alert Behavior During Therapy: WFL for tasks assessed/performed Overall Cognitive Status: Within Functional Limits for tasks assessed Area of Impairment: Memory;Problem solving                     Memory: Decreased short-term memory                Exercises      General Comments        Pertinent Vitals/Pain Faces Pain Scale: Hurts a little bit Pain Location: headache    Home Living                      Prior Function            PT Goals (current goals can now be found  in the care plan section) Acute Rehab PT Goals PT Goal Formulation: With patient/family Time For Goal Achievement: 06/13/18 Potential to Achieve Goals: Good Progress towards PT goals: Progressing toward goals    Frequency    Min 3X/week      PT Plan Current plan remains appropriate    Co-evaluation              AM-PAC PT "6 Clicks" Mobility   Outcome Measure  Help needed turning from your back to your side while in a flat bed without using bedrails?: A Little Help needed moving from lying on your back to sitting on the side of  a flat bed without using bedrails?: A Little Help needed moving to and from a bed to a chair (including a wheelchair)?: A Little Help needed standing up from a chair using your arms (e.g., wheelchair or bedside chair)?: None Help needed to walk in hospital room?: A Little Help needed climbing 3-5 steps with a railing? : A Lot 6 Click Score: 18    End of Session Equipment Utilized During Treatment: Gait belt;Oxygen Activity Tolerance: Treatment limited secondary to medical complications (Comment);Patient tolerated treatment well Patient left: in chair;with call bell/phone within reach;with family/visitor present Nurse Communication: Mobility status PT Visit Diagnosis: Muscle weakness (generalized) (M62.81);Difficulty in walking, not elsewhere classified (R26.2)     Time: 1030-1055 PT Time Calculation (min) (ACUTE ONLY): 25 min  Charges:  $Gait Training: 23-37 mins                     Reinaldo Berber, PT, DPT Acute Rehabilitation Services Pager: [phone] Office: [phone]     Reinaldo Berber 06/09/2018, 10:59 AM

## 2018-06-09 NOTE — Progress Notes (Signed)
[facility] Progress Note  Patient: Ricardo Watkins WER:[id number]   PCP: [name], MD DOB: 08/21/1930   DOA: 05/28/2018   DOS: 06/09/2018   Date of Service: the patient was seen and examined on 06/09/2018  Brief hospital course: Pt. with PMH of PVD s/p R renal artery stent; remote prostate CA s/p seed radiation; NHL; HTN; HLD; DM; chronic ITP; CHF; and CAD s/p stent(recentlydeemed not an interventional candidate)h/o 4 admission in 12 months and 13 ED visits in 12 months; admitted on 05/28/2018, presented with complaint of chills and chest pain, was found to have pneumonia and enterococcus bacteremia.  S/P 2 PRBC on 05/29/2018 -ID following, now on IV Unasyn -CT chest with rounded dense masslike opacity in the right upper lobe, rounded pneumonia versus mass/cancer and a spiculated nodule in the left upper lobe as well -Underwent thoracentesis 12/14, continues to be frequently transfusion dependent due to advanced MDS -12/14 underwent thoracentesis, fluid was transudative -Was febrile again 12/15 into 12/16 a.m and 12/17  Subjective:   feeling better, no nausea no vomiting.  No fever no chills.  No blood in the stool.  No diarrhea.  Assessment and Plan: 1.  Sepsis/enterococcal bacteremia Right upper lobe pneumonia -Presumed to be secondary to healthcare associated pneumonia, present on admission -Repeat x-ray 12/11 showed worsening opacity in the right upper lobe  -Blood cultures grew enterococcus faecium -Antibiotics transitioned back to Unasyn per infectious disease 12/11 -Infectious disease was consulted recommended 2-week course of IV Unasyn until 12/23, repeat cultures are negative, CT chest with right upper lobe lung mass versus rounded pneumonia no acute abdominal findings  -Unfortunately has had recurrent fevers, - all repeat blood cultures are negative,  - chest x-ray with stable right lung pneumonia, CT abdomen done earlier this admission was unremarkable -ID was  reconsulted, felt that the patient's recurrent fever could be secondary to drug fever versus tumor fever but patient's antibiotics were changed to IV meropenem. Fever trending down with meropenem and therefore ID recommended to continue IV meropenem until 06/12/2018. Appreciate ID input.  Currently signed off.  We will continue antibiotic until June 12, 2018.  - While the CT scan of the chest does make a comment about possible right upper lobe mass it was not present on March 2019 CT scan of the chest thus less likely that the mass has grown up to this big size in the short time.  Although the possibility of that cannot be ruled out completely.  I would recommend to repeat another CT scan of the chest in 4 weeks to monitor resolution of the right upper lobe mass like consolidation.  2.    Moderate right pleural effusion -Likely associated with right upper lobe pneumonia vs mass -Underwent thoracentesis in radiology 12/14 with 650 cc fluid drained appears transudate of, negative for malignancy cytology -Unfortunately cultures did not get sent   3. Myelodysplastic syndrome with 5 q. Deletion. -With symptomatic anemia, chronic neutropenia and thrombocytopenia -Transfused 2 units of PRBC for hemoglobin of 6.9 on 12/9, and then another unit of PRBC on 12/13 -On supportive care, transfuse as needed -He is also on Nplate and erythropoietin -Prognosis appears quite poor at this time,  -Earlier provider discussed situation with Dr.Gorsuch, who agrees that he worsen with frequent hospitalizations, recurrent infections and potential need to discuss hospice. -Due to worsening thrombocytopenia discussed with Dr. Hilbert Odor on 06/09/2018.she recommended to trial daily CBC and transfuse for platelets less than 20 or for active bleeding.  She does not see  any benefit of using Nplate while the patient is dealing with active infection, appreciate her assistance. -She will follow-up with him soon as outpatient,  tentatively June 20, 2018 -Platelets are trending down for now less than 60 and therefore I will be holding aspirin and Plavix.  3.  CAD/chronic systolic and diastolic CHF -Echocardiogram in 9/2 0 19 showed EF of 30 to 96% with diastolic dysfunction -Repeat echo now showed improvement in EF to 50%, no overt vegetations -Given IV Lasix intermittently  -Currently holding aspirin and Plavix due to thrombocytopenia, resume when platelets are more than 60.  4.  Acute kidney injury on chronic kidney disease stage IV. Baseline renal function around 1.8-1.7 -Creatinine was 2.4 on admission, trended down to 2.0, back up to 2.2 with worsening BUN as well, now back to 1.7. -likely due to ongoing sepsis -Diuretics were held in the setting of sepsis and high fevers -Diuretics restarted, use as needed   5. 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 Thrombocytopenia -due to MDS, see above, now trending down, hold aspirin Plavix.  7.  Goals of care discussion. -Multiple frequent hospitalizations -Seen by palliative care for goals of care discussion, Patient is DNR/DNI -Prognosis appears poor overall, discussed this with daughter at bedside, he would qualify for home hospice however would not be eligible for blood transfusions then, d/w Dr.Gorsuch 12/16, she will discuss poor prognosis and options with family post discharge with quick FU  Diet: regular diet thin DVT Prophylaxis: mechanical compression device, avoid heparin and lovenox  Advance goals of care discussion: DNR DNI  Family Communication: Granddaughter at bedside, discussed with daughter  Disposition: Home with home health on June 12, 2018 after completion of meropenem dose.  Consultants: ID, Palliative care  Procedures: 12/14 thoracentesis, 650 cc of serous pleural fluid drained  echocardiogram    Scheduled Meds: . sodium chloride   Intravenous Once  . acetaminophen  650 mg Oral Once  . feeding supplement (ENSURE ENLIVE)  237 mL Oral BID BM  . gabapentin  100 mg Oral QHS  . insulin aspart  0-5 Units Subcutaneous QHS  . insulin aspart  0-9 Units Subcutaneous TID WC  . insulin glargine  20 Units Subcutaneous q morning - 10a  . metoprolol succinate  50 mg Oral Daily  . multivitamin with minerals  1 tablet Oral q morning - 10a  . naphazoline-pheniramine  1 drop Both Eyes Daily  . polyethylene glycol  17 g Oral Daily  . pravastatin  40 mg Oral q1800  . senna-docusate  1 tablet Oral BID  . tamsulosin  0.4 mg Oral Daily   Continuous Infusions: . meropenem (MERREM) IV 1 g (06/09/18 0437)   PRN Meds: acetaminophen, guaiFENesin, ipratropium-albuterol, loperamide, neomycin-bacitracin-polymyxin, nitroGLYCERIN, ondansetron (ZOFRAN) IV, traMADol Antibiotics: Anti-infectives (From admission, onward)   Start     Dose/Rate Route Frequency Ordered Stop   06/06/18 1700  meropenem (MERREM) 1 g in sodium chloride 0.9 % 100 mL IVPB     1 g 200 mL/hr over 30 Minutes Intravenous Every 12 hours 06/06/18 1623 06/12/18 2359   05/31/18 1000  Ampicillin-Sulbactam (UNASYN) 3 g in sodium chloride 0.9 % 100 mL IVPB  Status:  Discontinued     3 g 200 mL/hr over 30 Minutes Intravenous Every 12 hours 05/31/18 0841 06/06/18 1606   05/30/18 1500  DAPTOmycin (CUBICIN) 670 mg in sodium chloride 0.9 % IVPB  Status:  Discontinued     670 mg 226.8 mL/hr over 30 Minutes Intravenous Every 48 hours 05/30/18 1327 05/31/18 0835   05/29/18 1400  Ampicillin-Sulbactam (UNASYN) 3 g in sodium chloride 0.9 % 100 mL IVPB  Status:  Discontinued     3 g 200 mL/hr over 30 Minutes Intravenous Every 12 hours 05/29/18 1059 05/30/18 1326   05/29/18 1000  vancomycin (VANCOCIN) IVPB 750 mg/150 ml premix  Status:  Discontinued     750 mg 150 mL/hr over 60 Minutes Intravenous Every 24 hours 05/28/18 1004 05/29/18 1052   05/28/18  2200  ceFEPIme (MAXIPIME) 1 g in sodium chloride 0.9 % 100 mL IVPB  Status:  Discontinued     1 g 200 mL/hr over 30 Minutes Intravenous Every 12 hours 05/28/18 1004 05/29/18 1052   05/28/18 0845  ceFEPIme (MAXIPIME) 2 g in sodium chloride 0.9 % 100 mL IVPB     2 g 200 mL/hr over 30 Minutes Intravenous  Once 05/28/18 0834 05/28/18 0931   05/28/18 0845  vancomycin (VANCOCIN) IVPB 1000 mg/200 mL premix  Status:  Discontinued     1,000 mg 200 mL/hr over 60 Minutes Intravenous  Once 05/28/18 0834 05/28/18 0839   05/28/18 0845  vancomycin (VANCOCIN) 1,500 mg in sodium chloride 0.9 % 500 mL IVPB     1,500 mg 250 mL/hr over 120 Minutes Intravenous  Once 05/28/18 0839 05/28/18 1156       Objective: Physical Exam: Vitals:   06/08/18 1400 06/08/18 1719 06/09/18 0048 06/09/18 0721  BP:  (!) 130/58 (!) 141/58 (!) 145/74  Pulse:  89 90 98  Resp:  18 20 16   Temp: 98.2 F (36.8 C) 97.7 F (36.5 C) 98.8 F (37.1 C) (!) 97.4 F (36.3 C)  TempSrc: Oral Oral Oral Oral  SpO2:  98% 98% 93%  Weight:      Height:        Intake/Output Summary (Last 24 hours) at 06/09/2018 1347 Last data filed at 06/09/2018 1000 Gross per 24 hour  Intake 99.87 ml  Output 1150 ml  Net -1050.13 ml   Filed Weights   05/28/18 0826  Weight: 83.4 kg   Gen: Awake, Alert, Oriented X 2, Ill appearing, frail elderly debilitated male, sitting up in bed HEENT: PERRLA, Neck supple, no JVD Lungs: decreased BS at both bases CVS: S1S2/RRR Abd: soft, Non tender, non distended, BS present Extremities: No edema Skin: no new rashes  Data Reviewed: CBC: Recent Labs  Lab 06/04/18 0247 06/05/18 0242 06/06/18 0213 06/08/18 0229 06/09/18 1152  WBC 5.5 5.4 5.0 4.3 3.6*  NEUTROABS  --   --   --  2.1 1.3*  HGB 7.9* 7.9* 8.4* 7.7* 8.2*  HCT 25.4* 26.5* 28.4* 25.2* 27.1*  MCV 86.7 88.3 89.6 89.7 89.4  PLT 78* 68* 57* 48* 59*   Basic Metabolic Panel: Recent Labs  Lab 06/03/18 0229 06/04/18 0247 06/05/18 0242  06/06/18 0213 06/08/18 0229  NA 140 140 141 139 138  K 3.8 3.7 4.9 5.3* 4.4  CL 107 104 104 103 100  CO2 22 24 25 26 28   GLUCOSE 127* 121* 143* 139* 135*  BUN 45* 47* 44* 41* 36*  CREATININE 2.29* 2.08* 2.09* 1.99* 1.74*  CALCIUM 8.1* 7.8* 8.0* 8.4* 8.2*  MG  --   --   --   --  2.2    Liver Function Tests: Recent Labs  Lab 06/08/18 0229  AST 23  ALT 34  ALKPHOS 64  BILITOT 0.5  PROT 6.9  ALBUMIN 1.6*   No results for input(s): LIPASE, AMYLASE in the last 168 hours. No results for input(s): AMMONIA in the last 168 hours. Coagulation Profile: No results for input(s): INR, PROTIME in the last 168 hours. Cardiac Enzymes: No results for input(s): CKTOTAL, CKMB, CKMBINDEX, TROPONINI in the last 168 hours. BNP (last 3 results) No results for input(s): PROBNP in the last 8760 hours. CBG: Recent Labs  Lab 06/08/18 1139 06/08/18 1720 06/08/18 2108 06/09/18 0722 06/09/18 1137  GLUCAP 233* 229* 188* 112* 190*   Studies: No results found.   Time spent: 35 minutes [place] [facility] via [email or web address] 06/09/2018 1:47 PM  Between 7PM-7AM, please contact night-coverage at [email or web address], password [facility]

## 2018-06-09 NOTE — Telephone Encounter (Signed)
Scheduled appt per 12/20 sch message - pt daughter is aware of appt date and time

## 2018-06-10 LAB — CBC WITH DIFFERENTIAL/PLATELET
BASOS ABS: 0 10*3/uL (ref 0.0–0.1)
Band Neutrophils: 0 %
Basophils Relative: 0 %
Blasts: 0 %
Eosinophils Absolute: 0 10*3/uL (ref 0.0–0.5)
Eosinophils Relative: 0 %
HCT: 24.5 % — ABNORMAL LOW (ref 39.0–52.0)
HEMOGLOBIN: 7.2 g/dL — AB (ref 13.0–17.0)
Lymphocytes Relative: 43 %
Lymphs Abs: 1.3 10*3/uL (ref 0.7–4.0)
MCH: 26.8 pg (ref 26.0–34.0)
MCHC: 29.4 g/dL — ABNORMAL LOW (ref 30.0–36.0)
MCV: 91.1 fL (ref 80.0–100.0)
Metamyelocytes Relative: 0 %
Monocytes Absolute: 0.6 10*3/uL (ref 0.1–1.0)
Monocytes Relative: 18 %
Myelocytes: 0 %
NRBC: 0 % (ref 0.0–0.2)
Neutro Abs: 1.2 10*3/uL — ABNORMAL LOW (ref 1.7–7.7)
Neutrophils Relative %: 39 %
Other: 0 %
PROMYELOCYTES RELATIVE: 0 %
Platelets: 57 10*3/uL — ABNORMAL LOW (ref 150–400)
RBC: 2.69 MIL/uL — AB (ref 4.22–5.81)
RDW: 19.1 % — ABNORMAL HIGH (ref 11.5–15.5)
WBC: 3.1 10*3/uL — AB (ref 4.0–10.5)
nRBC: 0 /100 WBC

## 2018-06-10 LAB — GLUCOSE, CAPILLARY
GLUCOSE-CAPILLARY: 197 mg/dL — AB (ref 70–99)
Glucose-Capillary: 126 mg/dL — ABNORMAL HIGH (ref 70–99)
Glucose-Capillary: 135 mg/dL — ABNORMAL HIGH (ref 70–99)
Glucose-Capillary: 185 mg/dL — ABNORMAL HIGH (ref 70–99)

## 2018-06-10 NOTE — Progress Notes (Signed)
[facility] Progress Note  Patient: Ricardo Watkins FUX:[id number]   PCP: [name], MD DOB: February 20, 1931   DOA: 05/28/2018   DOS: 06/10/2018   Date of Service: the patient was seen and examined on 06/10/2018  Brief hospital course: Pt. with PMH of PVD s/p R renal artery stent; remote prostate CA s/p seed radiation; NHL; HTN; HLD; DM; chronic ITP; CHF; and CAD s/p stent(recentlydeemed not an interventional candidate)h/o 4 admission in 12 months and 13 ED visits in 12 months; admitted on 05/28/2018, presented with complaint of chills and chest pain, was found to have pneumonia and enterococcus bacteremia.  S/P 2 PRBC on 05/29/2018 -ID following, now on IV Unasyn -CT chest with rounded dense masslike opacity in the right upper lobe, rounded pneumonia versus mass/cancer and a spiculated nodule in the left upper lobe as well -Underwent thoracentesis 12/14, continues to be frequently transfusion dependent due to advanced MDS -12/14 underwent thoracentesis, fluid was transudative -Was febrile again 12/15 into 12/16 a.m and 12/17  Subjective:   Feeling better no nausea no vomiting.  Assessment and Plan: 1.  Sepsis/enterococcal bacteremia Right upper lobe pneumonia -Presumed to be secondary to healthcare associated pneumonia, present on admission -Repeat x-ray 12/11 showed worsening opacity in the right upper lobe  -Blood cultures grew enterococcus faecium -Antibiotics transitioned back to Unasyn per infectious disease 12/11 -Infectious disease was consulted recommended 2-week course of IV Unasyn until 12/23, repeat cultures are negative, CT chest with right upper lobe lung mass versus rounded pneumonia no acute abdominal findings  -Unfortunately has had recurrent fevers, - all repeat blood cultures are negative,  - chest x-ray with stable right lung pneumonia, CT abdomen done earlier this admission was unremarkable -ID was reconsulted, felt that the patient's recurrent fever could be  secondary to drug fever versus tumor fever but patient's antibiotics were changed to IV meropenem. Fever trending down with meropenem and therefore ID recommended to continue IV meropenem until 06/12/2018. Appreciate ID input.  Currently signed off.  We will continue antibiotic until June 12, 2018.  - While the CT scan of the chest does make a comment about possible right upper lobe mass it was not present on March 2019 CT scan of the chest thus less likely that the mass has grown up to this big size in the short time.  Although the possibility of that cannot be ruled out completely.  I would recommend to repeat another CT scan of the chest in 4 weeks to monitor resolution of the right upper lobe mass like consolidation.  2.    Moderate right pleural effusion -Likely associated with right upper lobe pneumonia vs mass -Underwent thoracentesis in radiology 12/14 with 650 cc fluid drained appears transudate of, negative for malignancy cytology -Unfortunately cultures did not get sent   3. Myelodysplastic syndrome with 5 q. Deletion. -With symptomatic anemia, chronic neutropenia and thrombocytopenia -Transfused 2 units of PRBC for hemoglobin of 6.9 on 12/9, and then another unit of PRBC on 12/13 -On supportive care, transfuse as needed -He is also on Nplate and erythropoietin -Prognosis appears quite poor at this time,  -Earlier provider discussed situation with Dr.Gorsuch, who agrees that he worsen with frequent hospitalizations, recurrent infections and potential need to discuss hospice. -Due to worsening thrombocytopenia discussed with Dr. Hilbert Odor on 06/09/2018.she recommended to trial daily CBC and transfuse for platelets less than 20 or for active bleeding.  She does not see any benefit of using Nplate while the patient is dealing with active infection, appreciate  her assistance. -She will follow-up with him soon as outpatient, tentatively June 20, 2018 -Platelets are trending down for  now less than 60 and therefore I will be holding aspirin and Plavix.  3.  CAD/chronic systolic and diastolic CHF -Echocardiogram in 9/2 0 19 showed EF of 30 to 42% with diastolic dysfunction -Repeat echo now showed improvement in EF to 50%, no overt vegetations -Given IV Lasix intermittently  -Currently holding aspirin and Plavix due to thrombocytopenia, resume when platelets are more than 60.  4.  Acute kidney injury on chronic kidney disease stage IV. Baseline renal function around 1.8-1.7 -Creatinine was 2.4 on admission, trended down to 2.0, back up to 2.2 with worsening BUN as well, now back to 1.7. -likely due to ongoing sepsis -Diuretics were held in the setting of sepsis and high fevers -Diuretics restarted, use as needed   5. 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 Thrombocytopenia -due to MDS, see above, now trending down, hold aspirin Plavix.  7.  Goals of care discussion. -Multiple frequent hospitalizations -Seen by palliative care for goals of care discussion, Patient is DNR/DNI -Prognosis appears poor overall, discussed this with daughter at bedside, he would qualify for home hospice however would not be eligible for blood transfusions then, d/w Dr.Gorsuch 12/16, she will discuss poor prognosis and options with family post discharge with quick FU  Diet: regular diet thin DVT Prophylaxis: mechanical compression device, avoid heparin and lovenox  Advance goals of care discussion: DNR DNI  Family Communication: Granddaughter at bedside, discussed with daughter  Disposition: Home with home health on June 12, 2018 after completion of meropenem dose.  Consultants: ID, Palliative care  Procedures: 12/14 thoracentesis, 650 cc of serous pleural fluid drained  echocardiogram   Scheduled Meds: . sodium chloride   Intravenous Once  .  acetaminophen  650 mg Oral Once  . feeding supplement (ENSURE ENLIVE)  237 mL Oral BID BM  . gabapentin  100 mg Oral QHS  . insulin aspart  0-5 Units Subcutaneous QHS  . insulin aspart  0-9 Units Subcutaneous TID WC  . insulin glargine  20 Units Subcutaneous q morning - 10a  . metoprolol succinate  50 mg Oral Daily  . multivitamin with minerals  1 tablet Oral q morning - 10a  . naphazoline-pheniramine  1 drop Both Eyes Daily  . polyethylene glycol  17 g Oral Daily  . pravastatin  40 mg Oral q1800  . senna-docusate  1 tablet Oral BID  . tamsulosin  0.4 mg Oral Daily   Continuous Infusions: . meropenem (MERREM) IV 1 g (06/10/18 1710)   PRN Meds: acetaminophen, guaiFENesin, ipratropium-albuterol, loperamide, neomycin-bacitracin-polymyxin, nitroGLYCERIN, ondansetron (ZOFRAN) IV, traMADol Antibiotics: Anti-infectives (From admission, onward)   Start     Dose/Rate Route Frequency Ordered Stop   06/06/18 1700  meropenem (MERREM) 1 g in sodium chloride 0.9 % 100 mL IVPB     1 g 200 mL/hr over 30 Minutes Intravenous Every 12 hours 06/06/18 1623 06/12/18 2359   05/31/18 1000  Ampicillin-Sulbactam (UNASYN) 3 g in sodium chloride 0.9 % 100 mL IVPB  Status:  Discontinued     3 g 200 mL/hr over 30 Minutes Intravenous Every 12 hours 05/31/18 0841 06/06/18 1606   05/30/18 1500  DAPTOmycin (CUBICIN) 670 mg in sodium chloride 0.9 % IVPB  Status:  Discontinued     670 mg 226.8 mL/hr over 30 Minutes Intravenous  Every 48 hours 05/30/18 1327 05/31/18 0835   05/29/18 1400  Ampicillin-Sulbactam (UNASYN) 3 g in sodium chloride 0.9 % 100 mL IVPB  Status:  Discontinued     3 g 200 mL/hr over 30 Minutes Intravenous Every 12 hours 05/29/18 1059 05/30/18 1326   05/29/18 1000  vancomycin (VANCOCIN) IVPB 750 mg/150 ml premix  Status:  Discontinued     750 mg 150 mL/hr over 60 Minutes Intravenous Every 24 hours 05/28/18 1004 05/29/18 1052   05/28/18 2200  ceFEPIme (MAXIPIME) 1 g in sodium chloride 0.9 % 100 mL  IVPB  Status:  Discontinued     1 g 200 mL/hr over 30 Minutes Intravenous Every 12 hours 05/28/18 1004 05/29/18 1052   05/28/18 0845  ceFEPIme (MAXIPIME) 2 g in sodium chloride 0.9 % 100 mL IVPB     2 g 200 mL/hr over 30 Minutes Intravenous  Once 05/28/18 0834 05/28/18 0931   05/28/18 0845  vancomycin (VANCOCIN) IVPB 1000 mg/200 mL premix  Status:  Discontinued     1,000 mg 200 mL/hr over 60 Minutes Intravenous  Once 05/28/18 0834 05/28/18 0839   05/28/18 0845  vancomycin (VANCOCIN) 1,500 mg in sodium chloride 0.9 % 500 mL IVPB     1,500 mg 250 mL/hr over 120 Minutes Intravenous  Once 05/28/18 0839 05/28/18 1156       Objective: Physical Exam: Vitals:   06/09/18 1638 06/10/18 0034 06/10/18 0857 06/10/18 1638  BP: 140/63 (!) 147/68 (!) 143/74 134/63  Pulse: 98 95 99 97  Resp: 19 (!) 21 (!) 22 20  Temp: 98.8 F (37.1 C) 98.6 F (37 C) 97.7 F (36.5 C) 97.8 F (36.6 C)  TempSrc: Oral Oral Oral   SpO2: 97% 100% 97% 98%  Weight:      Height:        Intake/Output Summary (Last 24 hours) at 06/10/2018 1728 Last data filed at 06/10/2018 1144 Gross per 24 hour  Intake 360 ml  Output 800 ml  Net -440 ml   Filed Weights   05/28/18 0826  Weight: 83.4 kg   Gen: Awake, Alert, Oriented X 2, Ill appearing, frail elderly debilitated male, sitting up in bed HEENT: PERRLA, Neck supple, no JVD Lungs: decreased BS at both bases CVS: S1S2/RRR Abd: soft, Non tender, non distended, BS present Extremities: No edema Skin: no new rashes  Data Reviewed: CBC: Recent Labs  Lab 06/05/18 0242 06/06/18 0213 06/08/18 0229 06/09/18 1152 06/10/18 0236  WBC 5.4 5.0 4.3 3.6* 3.1*  NEUTROABS  --   --  2.1 1.3* 1.2*  HGB 7.9* 8.4* 7.7* 8.2* 7.2*  HCT 26.5* 28.4* 25.2* 27.1* 24.5*  MCV 88.3 89.6 89.7 89.4 91.1  PLT 68* 57* 48* 59* 57*   Basic Metabolic Panel: Recent Labs  Lab 06/04/18 0247 06/05/18 0242 06/06/18 0213 06/08/18 0229  NA 140 141 139 138  K 3.7 4.9 5.3* 4.4  CL 104  104 103 100  CO2 24 25 26 28   GLUCOSE 121* 143* 139* 135*  BUN 47* 44* 41* 36*  CREATININE 2.08* 2.09* 1.99* 1.74*  CALCIUM 7.8* 8.0* 8.4* 8.2*  MG  --   --   --  2.2    Liver Function Tests: Recent Labs  Lab 06/08/18 0229  AST 23  ALT 34  ALKPHOS 64  BILITOT 0.5  PROT 6.9  ALBUMIN 1.6*   No results for input(s): LIPASE, AMYLASE in the last 168 hours. No results for input(s): AMMONIA in the last 168 hours. Coagulation Profile:  No results for input(s): INR, PROTIME in the last 168 hours. Cardiac Enzymes: No results for input(s): CKTOTAL, CKMB, CKMBINDEX, TROPONINI in the last 168 hours. BNP (last 3 results) No results for input(s): PROBNP in the last 8760 hours. CBG: Recent Labs  Lab 06/09/18 1639 06/09/18 2125 06/10/18 0717 06/10/18 1202 06/10/18 1626  GLUCAP 210* 175* 126* 135* 185*   Studies: No results found.   Time spent: 35 minutes [place] Page via [email or web address] 06/10/2018 5:28 PM  Between 7PM-7AM, please contact night-coverage at [email or web address], password [facility]

## 2018-06-11 LAB — BASIC METABOLIC PANEL
Anion gap: 9 (ref 5–15)
BUN: 32 mg/dL — ABNORMAL HIGH (ref 8–23)
CALCIUM: 8.3 mg/dL — AB (ref 8.9–10.3)
CO2: 28 mmol/L (ref 22–32)
Chloride: 100 mmol/L (ref 98–111)
Creatinine, Ser: 1.68 mg/dL — ABNORMAL HIGH (ref 0.61–1.24)
GFR calc Af Amer: 42 mL/min — ABNORMAL LOW (ref >60)
GFR calc non Af Amer: 36 mL/min — ABNORMAL LOW (ref >60)
Glucose, Bld: 122 mg/dL — ABNORMAL HIGH (ref 70–99)
Potassium: 4.7 mmol/L (ref 3.5–5.1)
Sodium: 137 mmol/L (ref 135–145)

## 2018-06-11 LAB — CBC WITH DIFFERENTIAL/PLATELET
Abs Immature Granulocytes: 0.26 10*3/uL — ABNORMAL HIGH (ref 0.00–0.07)
Basophils Absolute: 0 10*3/uL (ref 0.0–0.1)
Basophils Relative: 0 %
Eosinophils Absolute: 0 10*3/uL (ref 0.0–0.5)
Eosinophils Relative: 0 %
HCT: 26.1 % — ABNORMAL LOW (ref 39.0–52.0)
Hemoglobin: 7.7 g/dL — ABNORMAL LOW (ref 13.0–17.0)
Immature Granulocytes: 7 %
LYMPHS PCT: 47 %
Lymphs Abs: 1.7 10*3/uL (ref 0.7–4.0)
MCH: 26.7 pg (ref 26.0–34.0)
MCHC: 29.5 g/dL — ABNORMAL LOW (ref 30.0–36.0)
MCV: 90.6 fL (ref 80.0–100.0)
Monocytes Absolute: 0.7 10*3/uL (ref 0.1–1.0)
Monocytes Relative: 20 %
NEUTROS ABS: 1 10*3/uL — AB (ref 1.7–7.7)
Neutrophils Relative %: 26 %
PLATELETS: 63 10*3/uL — AB (ref 150–400)
RBC: 2.88 MIL/uL — AB (ref 4.22–5.81)
RDW: 18.7 % — ABNORMAL HIGH (ref 11.5–15.5)
WBC: 3.7 10*3/uL — ABNORMAL LOW (ref 4.0–10.5)
nRBC: 0 % (ref 0.0–0.2)

## 2018-06-11 LAB — GLUCOSE, CAPILLARY
Glucose-Capillary: 114 mg/dL — ABNORMAL HIGH (ref 70–99)
Glucose-Capillary: 230 mg/dL — ABNORMAL HIGH (ref 70–99)
Glucose-Capillary: 161 mg/dL — ABNORMAL HIGH (ref 70–99)
Glucose-Capillary: 216 mg/dL — ABNORMAL HIGH (ref 70–99)

## 2018-06-11 NOTE — Plan of Care (Signed)
Pt denies sob and pain.  Resting comfortably on [place].

## 2018-06-11 NOTE — Progress Notes (Signed)
[facility] Progress Note  Patient: Ricardo Watkins ZOX:[id number]   PCP: [name], MD DOB: 1930/09/22   DOA: 05/28/2018   DOS: 06/11/2018   Date of Service: the patient was seen and examined on 06/11/2018  Brief hospital course: Pt. with PMH of PVD s/p R renal artery stent; remote prostate CA s/p seed radiation; NHL; HTN; HLD; DM; chronic ITP; CHF; and CAD s/p stent(recentlydeemed not an interventional candidate)h/o 4 admission in 12 months and 13 ED visits in 12 months; admitted on 05/28/2018, presented with complaint of chills and chest pain, was found to have pneumonia and enterococcus bacteremia.  S/P 2 PRBC on 05/29/2018 -ID following, now on IV Unasyn -CT chest with rounded dense masslike opacity in the right upper lobe, rounded pneumonia versus mass/cancer and a spiculated nodule in the left upper lobe as well -Underwent thoracentesis 12/14, continues to be frequently transfusion dependent due to advanced MDS -12/14 underwent thoracentesis, fluid was transudative -Was febrile again 12/15 into 12/16 a.m and 12/17  Subjective:   Tolerating oral diet.  No nausea no vomiting.  No bleeding reported.  No further fever.  Assessment and Plan: 1.  Sepsis/enterococcal bacteremia Right upper lobe pneumonia -Presumed to be secondary to healthcare associated pneumonia, present on admission -Repeat x-ray 12/11 showed worsening opacity in the right upper lobe  -Blood cultures grew enterococcus faecium -Antibiotics transitioned back to Unasyn per infectious disease 12/11 -Infectious disease was consulted recommended 2-week course of IV Unasyn until 12/23, repeat cultures are negative, CT chest with right upper lobe lung mass versus rounded pneumonia no acute abdominal findings  -Unfortunately has had recurrent fevers, - all repeat blood cultures are negative,  - chest x-ray with stable right lung pneumonia, CT abdomen done earlier this admission was unremarkable -ID was reconsulted,  felt that the patient's recurrent fever could be secondary to drug fever versus tumor fever but patient's antibiotics were changed to IV meropenem. Fever trending down with meropenem and therefore ID recommended to continue IV meropenem until 06/12/2018. Appreciate ID input.  Currently signed off.  We will continue antibiotic until June 12, 2018.  - While the CT scan of the chest does make a comment about possible right upper lobe mass it was not present on March 2019 CT scan of the chest thus less likely that the mass has grown up to this big size in the short time.  Although the possibility of that cannot be ruled out completely.  I would recommend to repeat another CT scan of the chest in 4 weeks to monitor resolution of the right upper lobe mass like consolidation.  2.    Moderate right pleural effusion -Likely associated with right upper lobe pneumonia vs mass -Underwent thoracentesis in radiology 12/14 with 650 cc fluid drained appears transudate of, negative for malignancy cytology -Unfortunately cultures did not get sent   3. Myelodysplastic syndrome with 5 q. Deletion. -With symptomatic anemia, chronic neutropenia and thrombocytopenia -Transfused 2 units of PRBC for hemoglobin of 6.9 on 12/9, and then another unit of PRBC on 12/13 -On supportive care, transfuse as needed -He is also on Nplate and erythropoietin -Prognosis appears quite poor at this time,  -Earlier provider discussed situation with Dr.Gorsuch, who agrees that he worsen with frequent hospitalizations, recurrent infections and potential need to discuss hospice. -Due to worsening thrombocytopenia discussed with Dr. Hilbert Odor on 06/09/2018.she recommended to trial daily CBC and transfuse for platelets less than 20 or for active bleeding.  She does not see any benefit of using  Nplate while the patient is dealing with active infection, appreciate her assistance. -She will follow-up with him soon as outpatient, tentatively  June 20, 2018 -Platelets are trending down for now less than 60 and therefore I will be holding aspirin and Plavix.  3.  CAD/chronic systolic and diastolic CHF -Echocardiogram in 9/2 0 19 showed EF of 30 to 98% with diastolic dysfunction -Repeat echo now showed improvement in EF to 50%, no overt vegetations -Given IV Lasix intermittently  -Currently holding aspirin and Plavix due to thrombocytopenia, resume when platelets are more than 60.  4.  Acute kidney injury on chronic kidney disease stage IV. Baseline renal function around 1.8-1.7 -Creatinine was 2.4 on admission, trended down to 2.0, back up to 2.2 with worsening BUN as well, now back to 1.7. -likely due to ongoing sepsis -Diuretics were held in the setting of sepsis and high fevers -Diuretics restarted, use as needed   5. 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 Thrombocytopenia -due to MDS, see above, now trending down, hold aspirin Plavix.  7.  Goals of care discussion. -Multiple frequent hospitalizations -Seen by palliative care for goals of care discussion, Patient is DNR/DNI -Prognosis appears poor overall, discussed this with daughter at bedside, he would qualify for home hospice however would not be eligible for blood transfusions then, d/w Dr.Gorsuch 12/16, she will discuss poor prognosis and options with family post discharge with quick FU  Diet: regular diet thin DVT Prophylaxis: mechanical compression device, avoid heparin and lovenox  Advance goals of care discussion: DNR DNI  Family Communication: Granddaughter at bedside, discussed with daughter  Disposition: Home with home health on June 12, 2018 after completion of meropenem dose.  Consultants: ID, Palliative care  Procedures: 12/14 thoracentesis, 650 cc of serous pleural fluid drained  echocardiogram   Scheduled Meds: .  sodium chloride   Intravenous Once  . acetaminophen  650 mg Oral Once  . feeding supplement (ENSURE ENLIVE)  237 mL Oral BID BM  . gabapentin  100 mg Oral QHS  . insulin aspart  0-5 Units Subcutaneous QHS  . insulin aspart  0-9 Units Subcutaneous TID WC  . insulin glargine  20 Units Subcutaneous q morning - 10a  . metoprolol succinate  50 mg Oral Daily  . multivitamin with minerals  1 tablet Oral q morning - 10a  . naphazoline-pheniramine  1 drop Both Eyes Daily  . polyethylene glycol  17 g Oral Daily  . pravastatin  40 mg Oral q1800  . senna-docusate  1 tablet Oral BID  . tamsulosin  0.4 mg Oral Daily   Continuous Infusions: . meropenem (MERREM) IV Stopped (06/11/18 0700)   PRN Meds: acetaminophen, guaiFENesin, ipratropium-albuterol, loperamide, neomycin-bacitracin-polymyxin, nitroGLYCERIN, ondansetron (ZOFRAN) IV, traMADol Antibiotics: Anti-infectives (From admission, onward)   Start     Dose/Rate Route Frequency Ordered Stop   06/06/18 1700  meropenem (MERREM) 1 g in sodium chloride 0.9 % 100 mL IVPB     1 g 200 mL/hr over 30 Minutes Intravenous Every 12 hours 06/06/18 1623 06/12/18 2359   05/31/18 1000  Ampicillin-Sulbactam (UNASYN) 3 g in sodium chloride 0.9 % 100 mL IVPB  Status:  Discontinued     3 g 200 mL/hr over 30 Minutes Intravenous Every 12 hours 05/31/18 0841 06/06/18 1606   05/30/18 1500  DAPTOmycin (CUBICIN) 670 mg in sodium chloride 0.9 % IVPB  Status:  Discontinued  670 mg 226.8 mL/hr over 30 Minutes Intravenous Every 48 hours 05/30/18 1327 05/31/18 0835   05/29/18 1400  Ampicillin-Sulbactam (UNASYN) 3 g in sodium chloride 0.9 % 100 mL IVPB  Status:  Discontinued     3 g 200 mL/hr over 30 Minutes Intravenous Every 12 hours 05/29/18 1059 05/30/18 1326   05/29/18 1000  vancomycin (VANCOCIN) IVPB 750 mg/150 ml premix  Status:  Discontinued     750 mg 150 mL/hr over 60 Minutes Intravenous Every 24 hours 05/28/18 1004 05/29/18 1052   05/28/18 2200  ceFEPIme  (MAXIPIME) 1 g in sodium chloride 0.9 % 100 mL IVPB  Status:  Discontinued     1 g 200 mL/hr over 30 Minutes Intravenous Every 12 hours 05/28/18 1004 05/29/18 1052   05/28/18 0845  ceFEPIme (MAXIPIME) 2 g in sodium chloride 0.9 % 100 mL IVPB     2 g 200 mL/hr over 30 Minutes Intravenous  Once 05/28/18 0834 05/28/18 0931   05/28/18 0845  vancomycin (VANCOCIN) IVPB 1000 mg/200 mL premix  Status:  Discontinued     1,000 mg 200 mL/hr over 60 Minutes Intravenous  Once 05/28/18 0834 05/28/18 0839   05/28/18 0845  vancomycin (VANCOCIN) 1,500 mg in sodium chloride 0.9 % 500 mL IVPB     1,500 mg 250 mL/hr over 120 Minutes Intravenous  Once 05/28/18 0839 05/28/18 1156       Objective: Physical Exam: Vitals:   06/10/18 0857 06/10/18 1638 06/10/18 2324 06/11/18 0736  BP: (!) 143/74 134/63 137/63 (!) 144/65  Pulse: 99 97 94 99  Resp: (!) 22 20 (!) 22 20  Temp: 97.7 F (36.5 C) 97.8 F (36.6 C) 97.8 F (36.6 C) 97.8 F (36.6 C)  TempSrc: Oral  Oral   SpO2: 97% 98% 100% 96%  Weight:      Height:        Intake/Output Summary (Last 24 hours) at 06/11/2018 1619 Last data filed at 06/11/2018 3154 Gross per 24 hour  Intake 860 ml  Output 500 ml  Net 360 ml   Filed Weights   05/28/18 0826  Weight: 83.4 kg   Gen: Awake, Alert, Oriented X 2, Ill appearing, frail elderly debilitated male, sitting up in bed HEENT: PERRLA, Neck supple, no JVD Lungs: decreased BS at both bases CVS: S1S2/RRR Abd: soft, Non tender, non distended, BS present Extremities: No edema Skin: no new rashes  Data Reviewed: CBC: Recent Labs  Lab 06/06/18 0213 06/08/18 0229 06/09/18 1152 06/10/18 0236 06/11/18 0223  WBC 5.0 4.3 3.6* 3.1* 3.7*  NEUTROABS  --  2.1 1.3* 1.2* 1.0*  HGB 8.4* 7.7* 8.2* 7.2* 7.7*  HCT 28.4* 25.2* 27.1* 24.5* 26.1*  MCV 89.6 89.7 89.4 91.1 90.6  PLT 57* 48* 59* 57* 63*   Basic Metabolic Panel: Recent Labs  Lab 06/05/18 0242 06/06/18 0213 06/08/18 0229 06/11/18 0817  NA 141  139 138 137  K 4.9 5.3* 4.4 4.7  CL 104 103 100 100  CO2 25 26 28 28   GLUCOSE 143* 139* 135* 122*  BUN 44* 41* 36* 32*  CREATININE 2.09* 1.99* 1.74* 1.68*  CALCIUM 8.0* 8.4* 8.2* 8.3*  MG  --   --  2.2  --     Liver Function Tests: Recent Labs  Lab 06/08/18 0229  AST 23  ALT 34  ALKPHOS 64  BILITOT 0.5  PROT 6.9  ALBUMIN 1.6*   No results for input(s): LIPASE, AMYLASE in the last 168 hours. No results for input(s): AMMONIA in  the last 168 hours. Coagulation Profile: No results for input(s): INR, PROTIME in the last 168 hours. Cardiac Enzymes: No results for input(s): CKTOTAL, CKMB, CKMBINDEX, TROPONINI in the last 168 hours. BNP (last 3 results) No results for input(s): PROBNP in the last 8760 hours. CBG: Recent Labs  Lab 06/10/18 1626 06/10/18 2126 06/11/18 0720 06/11/18 1208 06/11/18 1617  GLUCAP 185* 197* 114* 230* 161*   Studies: No results found.   Time spent: 35 minutes [place] [employer] [facility] via [email or web address] 06/11/2018 4:19 PM  Between 7PM-7AM, please contact night-coverage at [email or web address], password [email or web address]

## 2018-06-12 LAB — CBC WITH DIFFERENTIAL/PLATELET
Abs Immature Granulocytes: 0.19 10*3/uL — ABNORMAL HIGH (ref 0.00–0.07)
Basophils Absolute: 0 10*3/uL (ref 0.0–0.1)
Basophils Relative: 0 %
Eosinophils Absolute: 0 10*3/uL (ref 0.0–0.5)
Eosinophils Relative: 0 %
HCT: 25.8 % — ABNORMAL LOW (ref 39.0–52.0)
Hemoglobin: 7.6 g/dL — ABNORMAL LOW (ref 13.0–17.0)
Immature Granulocytes: 6 %
Lymphocytes Relative: 52 %
Lymphs Abs: 1.6 10*3/uL (ref 0.7–4.0)
MCH: 26.8 pg (ref 26.0–34.0)
MCHC: 29.5 g/dL — ABNORMAL LOW (ref 30.0–36.0)
MCV: 90.8 fL (ref 80.0–100.0)
Monocytes Absolute: 0.6 10*3/uL (ref 0.1–1.0)
Monocytes Relative: 19 %
Neutro Abs: 0.7 10*3/uL — ABNORMAL LOW (ref 1.7–7.7)
Neutrophils Relative %: 23 %
Platelets: 63 10*3/uL — ABNORMAL LOW (ref 150–400)
RBC: 2.84 MIL/uL — AB (ref 4.22–5.81)
RDW: 18.5 % — ABNORMAL HIGH (ref 11.5–15.5)
WBC: 3.1 10*3/uL — ABNORMAL LOW (ref 4.0–10.5)
nRBC: 0 % (ref 0.0–0.2)

## 2018-06-12 LAB — GLUCOSE, CAPILLARY
Glucose-Capillary: 111 mg/dL — ABNORMAL HIGH (ref 70–99)
Glucose-Capillary: 175 mg/dL — ABNORMAL HIGH (ref 70–99)
Glucose-Capillary: 177 mg/dL — ABNORMAL HIGH (ref 70–99)

## 2018-06-12 MED ORDER — ASPIRIN 81 MG PO TBEC: 81.0000 mg | DELAYED_RELEASE_TABLET | Freq: Every day | ORAL | 0 refills | Status: DC

## 2018-06-12 MED ORDER — ENSURE ENLIVE PO LIQD: 237.0000 mL | Freq: Two times a day (BID) | ORAL | 12 refills | Status: DC

## 2018-06-12 MED ORDER — METOPROLOL SUCCINATE ER 50 MG PO TB24: 50.0000 mg | ORAL_TABLET | Freq: Every day | ORAL | 0 refills | Status: AC

## 2018-06-12 MED ORDER — POLYETHYLENE GLYCOL 3350 17 G PO PACK: 17.0000 g | PACK | Freq: Every day | ORAL | 0 refills | Status: AC

## 2018-06-12 NOTE — Progress Notes (Signed)
SATURATION QUALIFICATIONS: (This note is used to comply with regulatory documentation for home oxygen)  Patient Saturations on Room Air at Rest = 88%  Patient Saturations on Room Air while Ambulating = 86%  Patient Saturations on 2 Liters of oxygen while Ambulating = 94%  

## 2018-06-12 NOTE — Progress Notes (Signed)
Physical Therapy Treatment Patient Details Name: Ricardo Watkins MRN: [id number] DOB: Apr 07, 1931 Today's Date: 06/12/2018    History of Present Illness 84 y.o. male admitted on 05/28/18 for weakness and cough.  Found to have sepsis due to HCAP with enterococcus bacteremia, myelodysplastic syndrome, symptomatic anemia s/p 2 Units PRBC, acute kidney injury on chronic kidney disease.  Pt with other significant PMH of DM 2, CKD, CAD, essential HTN, chronic combined diastolic and systolic CHF, chronic neutropenia, chronic thrombocytopenia, DVT, non  Hodgkin's lymphoma, stroke, and PVD.    PT Comments    Pt continues to demonstrate good mobility progress. He ambulated 300 feet with RW min guard assist. Pt on 2 L O2 throughout session with SpO2 94%. Wife and daughter present during session. They are very supportive and able to provide needed level of assist at home.    Follow Up Recommendations  Home health PT;Supervision for mobility/OOB     Equipment Recommendations  None recommended by PT    Recommendations for Other Services       Precautions / Restrictions Precautions Precautions: Fall;Other (comment) Precaution Comments: monitor O2 sats    Mobility  Bed Mobility               General bed mobility comments: Pt received in recliner.  Transfers Overall transfer level: Needs assistance Equipment used: Rolling walker (2 wheeled) Transfers: Sit to/from Stand Sit to Stand: Min guard Stand pivot transfers: Min guard       General transfer comment: cues for hand placement, min guard for safety  Ambulation/Gait Ambulation/Gait assistance: Min guard Gait Distance (Feet): 300 Feet Assistive device: Rolling walker (2 wheeled) Gait Pattern/deviations: Step-through pattern;Decreased stride length;Trunk flexed;Drifts right/left Gait velocity: decreased Gait velocity interpretation: <1.31 ft/sec, indicative of household ambulator General Gait Details: standing rest break x 2.  SpO2 94% on 2 L O2, cues to stay close to RW and stand tall   Stairs             Wheelchair Mobility    Modified Rankin (Stroke Patients Only)       Balance Overall balance assessment: Needs assistance Sitting-balance support: Feet supported;No upper extremity supported Sitting balance-Leahy Scale: Fair     Standing balance support: During functional activity;Bilateral upper extremity supported Standing balance-Leahy Scale: Poor Standing balance comment: requires use of RW                            Cognition Arousal/Alertness: Awake/alert Behavior During Therapy: WFL for tasks assessed/performed Overall Cognitive Status: Within Functional Limits for tasks assessed                                 General Comments: Wife and daughter present during session.      Exercises      General Comments        Pertinent Vitals/Pain Pain Assessment: No/denies pain    Home Living                      Prior Function            PT Goals (current goals can now be found in the care plan section) Acute Rehab PT Goals Patient Stated Goal: home PT Goal Formulation: With patient/family Time For Goal Achievement: 06/13/18 Potential to Achieve Goals: Good Progress towards PT goals: Progressing toward goals    Frequency    Min  3X/week      PT Plan Current plan remains appropriate    Co-evaluation              AM-PAC PT "6 Clicks" Mobility   Outcome Measure  Help needed turning from your back to your side while in a flat bed without using bedrails?: A Little Help needed moving from lying on your back to sitting on the side of a flat bed without using bedrails?: A Little Help needed moving to and from a bed to a chair (including a wheelchair)?: A Little Help needed standing up from a chair using your arms (e.g., wheelchair or bedside chair)?: None Help needed to walk in hospital room?: A Little Help needed climbing 3-5  steps with a railing? : A Lot 6 Click Score: 18    End of Session Equipment Utilized During Treatment: Gait belt;Oxygen Activity Tolerance: Patient tolerated treatment well Patient left: in chair;with call bell/phone within reach;with family/visitor present Nurse Communication: Mobility status PT Visit Diagnosis: Muscle weakness (generalized) (M62.81);Difficulty in walking, not elsewhere classified (R26.2)     Time: 7282-0601 PT Time Calculation (min) (ACUTE ONLY): 20 min  Charges:  $Gait Training: 8-22 mins                     Lorrin Goodell, PT  Office # [phone] Pager [phone]    Lorriane Shire 06/12/2018, 9:51 AM

## 2018-06-12 NOTE — Progress Notes (Signed)
Occupational Therapy Treatment Patient Details Name: Ricardo Watkins MRN: [id number] DOB: 04/19/1931 Today's Date: 06/12/2018    History of present illness 84 y.o. male admitted on 05/28/18 for weakness and cough.  Found to have sepsis due to HCAP with enterococcus bacteremia, myelodysplastic syndrome, symptomatic anemia s/p 2 Units PRBC, acute kidney injury on chronic kidney disease.  Pt with other significant PMH of DM 2, CKD, CAD, essential HTN, chronic combined diastolic and systolic CHF, chronic neutropenia, chronic thrombocytopenia, DVT, non  Hodgkin's lymphoma, stroke, and PVD.   OT comments  Pt progressing towards acute OT goals. Focus of session was pt/family education on energy conservation strategies and fall prevention during ADLs. Pt completed grooming task standing at sink mostly min guard level but 1x min A for LOB. Daughter and spouse present and involved during session. D/c plan remains appropriate.    Follow Up Recommendations  Home health OT;Supervision/Assistance - 24 hour    Equipment Recommendations  3 in 1 bedside commode    Recommendations for Other Services      Precautions / Restrictions Precautions Precautions: Fall;Other (comment) Precaution Comments: monitor O2 sats Restrictions Weight Bearing Restrictions: No       Mobility Bed Mobility Overal bed mobility: Needs Assistance Bed Mobility: Sit to Supine       Sit to supine: Min guard;HOB elevated   General bed mobility comments: Pt received in recliner.  Transfers Overall transfer level: Needs assistance Equipment used: Rolling walker (2 wheeled) Transfers: Sit to/from Stand Sit to Stand: Min guard Stand pivot transfers: Min guard       General transfer comment: cues for hand placement, min guard for safety    Balance Overall balance assessment: Needs assistance Sitting-balance support: Feet supported;No upper extremity supported Sitting balance-Leahy Scale: Fair     Standing balance  support: During functional activity;Bilateral upper extremity supported Standing balance-Leahy Scale: Poor Standing balance comment: requires use of RW                           ADL either performed or assessed with clinical judgement   ADL Overall ADL's : Needs assistance/impaired     Grooming: Wash/dry hands;Minimal assistance;Standing                               Functional mobility during ADLs: Min guard;Rolling walker General ADL Comments: Unsteadiness noted with 1 LOB needing min A to steady. Pt was asleep prior to OT arrival. Family reports he takes a awhile to fully wake up. Discussed fall prevention strategies and provided energy conservation education.     Vision       Perception     Praxis      Cognition Arousal/Alertness: Awake/alert Behavior During Therapy: WFL for tasks assessed/performed(sleepy) Overall Cognitive Status: Within Functional Limits for tasks assessed Area of Impairment: Memory;Problem solving                     Memory: Decreased short-term memory       Problem Solving: Slow processing General Comments: Wife and daughter present during session.        Exercises     Shoulder Instructions       General Comments Daughter and spouse present and involved during session    Pertinent Vitals/ Pain       Pain Assessment: No/denies pain  Home Living  Prior Functioning/Environment              Frequency  Min 2X/week        Progress Toward Goals  OT Goals(current goals can now be found in the care plan section)  Progress towards OT goals: Progressing toward goals  Acute Rehab OT Goals Patient Stated Goal: home OT Goal Formulation: With patient Time For Goal Achievement: 06/15/18 Potential to Achieve Goals: Good ADL Goals Pt Will Perform Eating: with set-up;sitting Pt Will Perform Grooming: with set-up;sitting Pt Will Perform Upper  Body Bathing: with min assist;sitting Pt Will Perform Upper Body Dressing: with min assist;sitting Pt Will Transfer to Toilet: with min guard assist;ambulating;bedside commode Additional ADL Goal #1: pt will perform bed mobility with min guard assist in preparation for ADL.  Plan Discharge plan remains appropriate    Co-evaluation                 AM-PAC OT "6 Clicks" Daily Activity     Outcome Measure   Help from another person eating meals?: None Help from another person taking care of personal grooming?: A Little Help from another person toileting, which includes using toliet, bedpan, or urinal?: A Lot Help from another person bathing (including washing, rinsing, drying)?: A Lot Help from another person to put on and taking off regular upper body clothing?: A Little Help from another person to put on and taking off regular lower body clothing?: A Lot 6 Click Score: 16    End of Session Equipment Utilized During Treatment: Rolling walker;Oxygen(1.5L)  OT Visit Diagnosis: Unsteadiness on feet (R26.81);Other abnormalities of gait and mobility (R26.89);Other symptoms and signs involving cognitive function;History of falling (Z91.81);Muscle weakness (generalized) (M62.81)   Activity Tolerance Patient tolerated treatment well   Patient Left in bed;with call bell/phone within reach;with family/visitor present   Nurse Communication          Time: 6861-6837 OT Time Calculation (min): 10 min  Charges: OT General Charges $OT Visit: 1 Visit OT Treatments $Self Care/Home Management : 8-22 mins  Tyrone Schimke, OT Acute Rehabilitation Services Pager: [phone] Office: [phone]    Hortencia Pilar 06/12/2018, 10:35 AM

## 2018-06-12 NOTE — Progress Notes (Signed)
Pt seen by MD, orders written for d/c home with home health.  Went over discharge instructions with pt, wife and daughter, answered all questions.  Removed IV, no complications..  Escorted for discharge via wheelchair with all belongings and home oxygen.  Will follow up outpatient with MD.

## 2018-06-12 NOTE — Consult Note (Signed)
   [facility] CM Inpatient Consult   06/12/2018  AUGUSTUS ZURAWSKI 26-Dec-1930 [id number]   Follow up:  Patient noted to discharge with Palliative Services with [place]. Patient with extreme high risk scores noted.  No additional needs noted for community follow up.  His primary care providers does the transition of care follow up.  Natividad Brood, RN BSN [facility] Liaison  [phone] business mobile phone Toll free office [phone]

## 2018-06-12 NOTE — Discharge Summary (Signed)
[facility] Discharge Summary   Patient: Ricardo Watkins [place] WNU:[id number]   PCP: [name], MD DOB: [birth year]   Date of admission: 05/28/2018   Date of discharge:  06/12/2018    Discharge Diagnoses:   Principal Problem:   HCAP (healthcare-associated pneumonia) Active Problems:   CAD (coronary artery disease)   Essential hypertension   Chronic renal failure, stage 3 (moderate) (HCC)   Myelodysplasia (myelodysplastic syndrome) (HCC)   Pleural effusion   Sepsis (HCC)   Type 2 diabetes mellitus (HCC)   CHF (congestive heart failure) (HCC)   FUO (fever of unknown origin)   Goals of care, counseling/discussion   Bacteremia   Palliative care encounter   Sepsis due to Enterococcus ([place])   CRF (chronic renal failure), stage 3 (moderate) ([place])   Palliative care by specialist   Community acquired pneumonia, bilateral   Lung mass   Admitted From: home Disposition:  Home with home health  Recommendations for Outpatient Follow-up:  1. Please follow-up with PCP in 1 week, hematology. 2. Please establish care with palliative care outpatient for care connection. 3. Please repeat another chest CT in 4 weeks to ensure resolution of the pneumonia.  Follow-up Information    [facility].   Specialty:  Home Health Services Why:  [facility], HHPT Contact information: [address] [place] [address] [phone]        [facility] [place] Follow up.   Why:  outpatient palliative services set up , they will contact you Contact information: [place]       Jenna Luo T, MD. Schedule an appointment as soon as possible for a visit in 1 week(s).   Specialty:  Family Medicine Contact information: [address] [place] [address] [place] Follow up.   Why:  oxygen Contact information: [address] [place]  [place] [address] [phone]          Diet recommendation: Cardiac diet  Activity: The patient is advised to gradually reintroduce usual activities.  Discharge Condition: good  Code Status: DNR/DNI  History of present illness: As per the H and P dictated on admission, "Ricardo Watkins a 84 y.o.malewith medical history significant ofPVD s/p R renal artery stent; remote prostate CA s/p seed radiation; NHL; HTN; HLD; DM; chronic ITP; CHF; and CAD s/p stent (recently deemed not an interventional candidate) presenting with 4 admission in 12 months and 13 ED visits in 12 months who presents today with a complaint chest pain.  Initial evaluation revealed he was found to have general weakness and fever. Symptoms acute onset in past 1-2 days, persistent, moderate. Fever onset in past day,102 this a.m. Pt w recent office visit, noted with progressive anemia, hgb 7.3. Pt denies blood loss, rectal bleeding or melena.  He has myelodysplastic syndrome and is followed by oncology for his pancytopenia.  Pt notes episodic, non productive cough. No sore throat or runny nose. No chest pain. No abd pain. Denies vomiting or diarrhea. Recent poor po intake.  He has been feeling weak over the past 24 to 36 hours.  Pt w recent right index finger infection - pt/family note slowly improving, is still taking doxycycline."  Hospital Course:  Summary of his active problems in the hospital is as following. 1.Sepsis/enterococcal bacteremia Right upper lobe pneumonia -Presumed to be secondary to healthcare associated pneumonia, present on  admission -Repeat x-ray 12/11 showed worsening opacity in the right upper lobe  -Blood cultures grew enterococcus faecium -Antibiotics transitioned back to Unasyn per infectious disease 12/11 -Infectious disease was consulted recommended 2-week course of IV Unasyn until 12/23, repeat cultures are negative, CT chest with right upper lobe lung mass versus rounded pneumonia no acute  abdominal findings  -Unfortunately has had recurrent fevers, - all repeat blood cultures are negative,  - chest x-ray with stable right lung pneumonia, CT abdomen done earlier this admission was unremarkable -ID was reconsulted, felt that the patient's recurrent fever could be secondary to drug fever versus tumor fever but patient's antibiotics were changed to IV meropenem. Fever trending down with meropenem and therefore ID recommended to continue IV meropenem until 06/12/2018. Appreciate ID input.  Currently signed off.  We will continue antibiotic until June 12, 2018.  - While the CT scan of the chest does make a comment about possible right upper lobe mass it was not present on March 2019 CT scan of the chest thus less likely that the mass has grown up to this big size in the short time.  Although the possibility of that cannot be ruled out completely.  I would recommend to repeat another CT scan of the chest in 4 weeks to monitor resolution of the right upper lobe mass like consolidation.  2.  Moderate right pleural effusion -Likely associated with right upper lobe pneumonia vs mass -Underwent thoracentesis in radiology 12/14 with 650 cc fluid drained appears transudate of, negative for malignancy cytology -Unfortunately cultures did not get sent   3. Myelodysplastic syndrome with 5 q. Deletion. -With symptomatic anemia, chronic neutropenia and thrombocytopenia -Transfused 2 units of PRBC for hemoglobin of 6.9 on 12/9, and then another unit of PRBC on 12/13 -On supportive care, transfuse as needed -He is also on Nplate and erythropoietin -Prognosis appears quite poor at this time,  -Earlier provider discussed situation with Dr.Gorsuch, who agrees that he worsen with frequent hospitalizations, recurrent infections and potential need to discuss hospice. -Due to worsening thrombocytopenia discussed with Dr. Hilbert Odor on 06/09/2018.she recommended to trial daily CBC and transfuse for  platelets less than 20 or for active bleeding.  She does not see any benefit of using Nplate while the patient is dealing with active infection, appreciate her assistance. -She will follow-up with him soon as outpatient, tentatively June 13, 2018  3.CAD/chronic systolic and diastolic CHF -Echocardiogram in 9/2 0 19 showed EF of 30 to 56% with diastolic dysfunction -Repeat echo now showed improvement in EF to 50%, no overt vegetations -Given IV Lasix intermittently  -Currently holding aspirin and Plavix due to thrombocytopenia, resume on discharge since his platelets are more than 60.  4.Acute kidney injury on chronic kidney disease stage IV. Baseline renal function around 1.8-1.7 -Creatinine was 2.4 on admission, trended down to 2.0, back up to 2.2 with worsening BUN as well, now back to 1.7. -likely due to ongoing sepsis -Diuretics were held in the setting of sepsis and high fevers -Diuretics restarted, use as needed   5. Type2Diabetes Mellitus, uncontroled withrenalcomplicationand neuropathic complication -continue home regimen -Hb A1c is 7.7  6. Thrombocytopenia -due to MDS, see above, now stable, resume aspirin and Plavix.  7.Goals of care discussion. -Multiple frequent hospitalizations -Seen by palliative care for goals of care discussion, Patient is DNR/DNI -Prognosis appears poor overall, discussed this with daughter at bedside, he would qualify for home hospice however would not be eligible for blood transfusions then, d/w Dr.Gorsuch 12/16, she  will discuss poor prognosis and options with family post discharge with quick FU  All other chronic medical condition were stable during the hospitalization.  Patient was seen by physical therapy, who recommended home health, which was arranged by Education officer, museum and case Freight forwarder. On the day of the discharge the patient's vitals were stable , and no other acute medical condition were reported by patient. the patient was  felt safe to be discharge at home with home health.  Consultants: Infectious disease Palliative care  Procedures:  Thoracentesis 06/03/2018. Echocardiogram  DISCHARGE MEDICATION: Allergies as of 06/12/2018      Reactions   Glucophage [metformin Hydrochloride] Other (See Comments)   Chest pain   Zetia [ezetimibe] Other (See Comments)   weakness   Fenofibrate Rash   Niacin-lovastatin Er Rash      Medication List    STOP taking these medications   furosemide 40 MG tablet Commonly known as:  LASIX   isosorbide mononitrate 60 MG 24 hr tablet Commonly known as:  IMDUR     TAKE these medications   acetaminophen 500 MG tablet Commonly known as:  TYLENOL Take 1,000 mg by mouth every 6 (six) hours as needed for mild pain, moderate pain or headache.   aspirin 81 MG EC tablet Take 1 tablet (81 mg total) by mouth daily.   clopidogrel 75 MG tablet Commonly known as:  PLAVIX TAKE 1 TABLET BY MOUTH ONCE A DAY WITH BREAKFAST What changed:  See the new instructions.   feeding supplement (ENSURE ENLIVE) Liqd Take 237 mLs by mouth 2 (two) times daily between meals.   gabapentin 100 MG capsule Commonly known as:  NEURONTIN TAKE 1 CAPSULE BY MOUTH AT BEDTIME   Insulin Glargine 100 UNIT/ML Solostar Pen Commonly known as:  LANTUS SOLOSTAR INJECT 20 UNITS EVERY NIGHT AT BEDTIME What changed:    how much to take  how to take this  when to take this   loperamide 2 MG capsule Commonly known as:  IMODIUM Take 2 mg by mouth as needed for diarrhea or loose stools.   meclizine 12.5 MG tablet Commonly known as:  ANTIVERT Take 1 tablet (12.5 mg total) by mouth 3 (three) times daily as needed for dizziness.   metoprolol succinate 50 MG 24 hr tablet Commonly known as:  TOPROL-XL Take 1 tablet (50 mg total) by mouth daily. Take with or immediately following a meal. What changed:    medication strength  how much to take  additional instructions   multivitamin with minerals  Tabs tablet Take 1 tablet by mouth every morning.   neomycin-bacitracin-polymyxin ointment Commonly known as:  NEOSPORIN Apply 1 application topically as needed for wound care (finger).   nitroGLYCERIN 0.4 MG SL tablet Commonly known as:  NITROSTAT Place 1 tablet (0.4 mg total) under the tongue every 5 (five) minutes as needed for chest pain.   NON FORMULARY Inject 1 Dose into the vein once a week. Platelet booster   OVER THE COUNTER MEDICATION Place 1 drop into both eyes daily as needed (dry eyes). Over the counter lubricating eye drop   polyethylene glycol packet Commonly known as:  MIRALAX / GLYCOLAX Take 17 g by mouth daily.   pravastatin 40 MG tablet Commonly known as:  PRAVACHOL Take 1 tablet (40 mg total) by mouth daily at 6 PM.   tamsulosin 0.4 MG Caps capsule Commonly known as:  FLOMAX TAKE 1 CAPSULE BY MOUTH ONCE DAILY   traMADol 50 MG tablet Commonly known as:  ULTRAM Take  1 tablet (50 mg total) by mouth every 6 (six) hours as needed for moderate pain.            Durable Medical Equipment  (From admission, onward)         Start     Ordered   06/08/18 1321  For home use only DME oxygen  Once    Question Answer Comment  Mode or (Route) Nasal cannula   Liters per Minute 3   Frequency Continuous (stationary and portable oxygen unit needed)   Oxygen delivery system Gas      06/08/18 1320         Allergies  Allergen Reactions  . Glucophage [Metformin Hydrochloride] Other (See Comments)    Chest pain  . Zetia [Ezetimibe] Other (See Comments)    weakness  . Fenofibrate Rash  . Niacin-Lovastatin Er Rash   Discharge Instructions    Diet - low sodium heart healthy   Complete by:  As directed    Discharge instructions   Complete by:  As directed    It is important that you read following instructions as well as go over your medication list with RN to help you understand your care after this hospitalization.  Discharge Instructions: Please  follow-up with PCP in one week  Please request your primary care physician to go over all Hospital Tests and Procedure/Radiological results at the follow up,  Please get all Hospital records sent to your PCP by signing hospital release before you go home.   Do not take more than prescribed Pain, Sleep and Anxiety Medications. You were cared for by a hospitalist during your hospital stay. If you have any questions about your discharge medications or the care you received while you were in the hospital after you are discharged, you can call the unit you were admitted to and ask to speak with the hospitalist on call if the hospitalist that took care of you is not available.  Once you are discharged, your primary care physician will handle any further medical issues. Please note that NO REFILLS for any discharge medications will be authorized once you are discharged, as it is imperative that you return to your primary care physician (or establish a relationship with a primary care physician if you do not have one) for your aftercare needs so that they can reassess your need for medications and monitor your lab values. You Must read complete instructions/literature along with all the possible adverse reactions/side effects for all the Medicines you take and that have been prescribed to you. Take any new Medicines after you have completely understood and accept all the possible adverse reactions/side effects. Wear Seat belts while driving. If you have smoked or chewed Tobacco in the last 2 yrs please stop smoking and/or stop any Recreational drug use.   Increase activity slowly   Complete by:  As directed      Discharge Exam: Filed Weights   05/28/18 0826  Weight: 83.4 kg   Vitals:   06/12/18 0722 06/12/18 1500  BP: 139/81 140/80  Pulse: 97 97  Resp:    Temp: 98.3 F (36.8 C) 98.6 F (37 C)  SpO2: 95% 96%   General: Appear in no distress, no Rash; Oral Mucosa moist Cardiovascular: S1 and S2  Present, no Murmur, no JVD Respiratory: Bilateral Air entry present and Clear to Auscultation, no Crackles, no wheezes Abdomen: Bowel Sound present, Soft and no tenderness Extremities: nono Pedal edema, no calf tenderness Neurology: Grossly no focal neuro deficit.  The results of significant diagnostics from this hospitalization (including imaging, microbiology, ancillary and laboratory) are listed below for reference.    Significant Diagnostic Studies: Ct Abdomen Pelvis Wo Contrast  Result Date: 05/31/2018 CLINICAL DATA:  Bacteremia. EXAM: CT ABDOMEN AND PELVIS WITHOUT CONTRAST TECHNIQUE: Multidetector CT imaging of the abdomen and pelvis was performed following the standard protocol without IV contrast. COMPARISON:  CT scan of April 14, 2018. FINDINGS: Lower chest: Mild right pleural effusion is noted with adjacent subsegmental atelectasis. Hepatobiliary: No focal liver abnormality is seen. No gallstones, gallbladder wall thickening, or biliary dilatation. Pancreas: Unremarkable. No pancreatic ductal dilatation or surrounding inflammatory changes. Spleen: Normal in size without focal abnormality. Adrenals/Urinary Tract: Adrenal glands appear normal. Stable exophytic right renal cyst is noted. No hydronephrosis or renal obstruction is noted. No renal or ureteral calculi are noted. Urinary bladder is unremarkable. Stomach/Bowel: Stomach is within normal limits. Appendix appears normal. No evidence of bowel wall thickening, distention, or inflammatory changes. Sigmoid diverticulosis is noted without inflammation. Vascular/Lymphatic: Aortic atherosclerosis. No enlarged abdominal or pelvic lymph nodes. Reproductive: Status post prostatic brachytherapy seed placement. Other: Large fat containing right inguinal hernia is noted which extends into the scrotum. This is stable compared to prior exam. Musculoskeletal: No acute or significant osseous findings. IMPRESSION: Mild right pleural effusion is noted with  adjacent subsegmental atelectasis. Sigmoid diverticulosis without inflammation. Status post prostatic brachytherapy seed placement. Stable large fat containing right inguinal hernia is noted which extends into the scrotum. Aortic Atherosclerosis (ICD10-I70.0). Electronically Signed   By: Marijo Conception, M.D.   On: 05/31/2018 12:50   Dg Chest 1 View  Result Date: 06/03/2018 CLINICAL DATA:  Post right-sided thoracentesis EXAM: CHEST  1 VIEW COMPARISON:  05/31/2018; chest CT-06/01/2018 FINDINGS: Grossly unchanged cardiac silhouette and mediastinal contours with unchanged appearance of mixed heterogeneous and consolidative right upper lobe mass with partial obscuration of the right paratracheal stripe. Unchanged appearance of known left upper lobe spiculated nodule. Pulmonary venous congestion without frank evidence of edema. Interval reduction/resolution of right-sided pleural effusion post thoracentesis. No pneumothorax. Unchanged trace left-sided pleural effusion. No acute osseous abnormalities. IMPRESSION: 1. Interval reduction/resolution of right-sided pleural effusion post thoracentesis. No pneumothorax. 2. Otherwise similar appearance of the chest including dominant consolidative/heterogeneous right upper lobe mass again worrisome for infection versus malignancy. 3. Unchanged slightly spiculated left upper lobe pulmonary nodule. 4. Pulmonary venous congestion without frank evidence of edema. Electronically Signed   By: Sandi Mariscal M.D.   On: 06/03/2018 10:45   Dg Chest 2 View  Result Date: 06/05/2018 CLINICAL DATA:  Fever and shortness of breath today. EXAM: CHEST - 2 VIEW COMPARISON:  06/03/2018 FINDINGS: The heart is mildly enlarged but stable. Persistent dense right upper lobe airspace consolidation, likely pneumonia. No recurrent right pleural effusion. Stable right basilar atelectasis. Persistent small left effusion and left basilar atelectasis. IMPRESSION: Stable dense right upper lobe airspace  consolidation. Persistent small left effusion and overlying atelectasis. Electronically Signed   By: Marijo Sanes M.D.   On: 06/05/2018 09:11   Dg Chest 2 View  Result Date: 05/31/2018 CLINICAL DATA:  New onset of fever. The patient has an upset stomach and is coughing. History of CHF, previous CVA, previous episodes of pneumonia, former smoker. EXAM: CHEST - 2 VIEW COMPARISON:  Portable chest x-ray of May 28, 2018 FINDINGS: There's been interval worsening in airspace opacity in the right upper lobe. The right lower lung is clear. The left lung is clear. The heart is top-normal in size. The pulmonary vascularity  is not engorged. There is calcification in the wall of the aortic arch. IMPRESSION: Worsening airspace opacity in the right upper lobe compatible with pneumonia. No overt CHF. Thoracic aortic atherosclerosis. Electronically Signed   By: David  Martinique M.D.   On: 05/31/2018 12:09   Ct Chest Wo Contrast  Result Date: 06/01/2018 CLINICAL DATA:  Fever for 2 days. EXAM: CT CHEST WITHOUT CONTRAST TECHNIQUE: Multidetector CT imaging of the chest was performed following the standard protocol without IV contrast. COMPARISON:  Chest x-ray 05/31/2018 FINDINGS: Cardiovascular: Aortic atherosclerosis and diffuse coronary artery calcifications. Aorta is normal caliber. Heart is normal size. Mediastinum/Nodes: Mildly enlarged mediastinal lymph nodes. Subcarinal lymph node has a short axis diameter of 15 mm. No hilar or visible axillary adenopathy. Lungs/Pleura: Small left effusion and moderate right effusion. Rounded masslike opacity noted in the right upper lobe with surrounding ground-glass disease. Ground-glass disease throughout much of the right upper lobe. Compressive atelectasis in the right lower lobe. Spiculated nodule in the posterior left upper lobe measures 11 mm. Upper Abdomen: Imaging into the upper abdomen shows no acute findings. Musculoskeletal: Chest wall soft tissues are unremarkable. No  acute bony abnormality. IMPRESSION: Rounded dense masslike consolidation in the right upper lobe with surrounding ground-glass disease throughout much of the right upper lobe. This could reflect large pulmonary mass/lung cancer or rounded pneumonia. Spiculated 11 mm nodule in the left upper lobe. Can not exclude primary lung cancer or metastasis. Mild mediastinal adenopathy. Moderate right pleural effusion and small left pleural effusion. Diffuse coronary artery disease Aortic Atherosclerosis (ICD10-I70.0). Electronically Signed   By: Rolm Baptise M.D.   On: 06/01/2018 16:29   Dg Chest Port 1 View  Result Date: 05/28/2018 CLINICAL DATA:  Chest pain. Prostate cancer. Lymphoma. EXAM: PORTABLE CHEST 1 VIEW COMPARISON:  05/01/2018 FINDINGS: Artifact overlies the chest. Mild cardiomegaly and aortic atherosclerosis. Newly seen abnormal density in the right upper lobe and in both lower lobes most consistent with pneumonia. Some pleural density noted on the left as well. IMPRESSION: New pulmonary density in the right apex and at both lung bases most consistent with pneumonia. Small associated left effusion. Electronically Signed   By: Nelson Chimes M.D.   On: 05/28/2018 09:07   US Thoracentesis Asp Pleural Space W/img Guide  Result Date: 06/03/2018 INDICATION: Symptomatic right sided pleural effusion. Please perform ultrasound-guided thoracentesis for therapeutic and diagnostic purposes. EXAM: US THORACENTESIS ASP PLEURAL SPACE W/IMG GUIDE COMPARISON:  Chest CT-06/01/2018 MEDICATIONS: None. COMPLICATIONS: None immediate. TECHNIQUE: Informed written consent was obtained from the patient after a discussion of the risks, benefits and alternatives to treatment. A timeout was performed prior to the initiation of the procedure. Initial ultrasound scanning demonstrates a small anechoic right-sided pleural effusion. The lower chest was prepped and draped in the usual sterile fashion. 1% lidocaine was used for local  anesthesia. Under direct ultrasound guidance, a 19 gauge, 7-cm, Yueh catheter was introduced. An ultrasound image was saved for documentation purposes. The thoracentesis was performed. The catheter was removed and a dressing was applied. The patient tolerated the procedure well without immediate post procedural complication. The patient was escorted to have an upright chest radiograph. FINDINGS: A total of approximately 650 cc of serous fluid was removed. Requested samples were sent to the laboratory. IMPRESSION: Successful ultrasound-guided right sided thoracentesis yielding 650 cc of pleural fluid. Electronically Signed   By: Sandi Mariscal M.D.   On: 06/03/2018 10:46    Microbiology: Recent Results (from the past 240 hour(s))  Culture, blood (routine x  2)     Status: None   Collection Time: 06/03/18  3:25 PM  Result Value Ref Range Status   Specimen Description BLOOD BLOOD RIGHT HAND  Final   Special Requests   Final    BOTTLES DRAWN AEROBIC ONLY Blood Culture adequate volume   Culture   Final    NO GROWTH 5 DAYS Performed at [facility], [address] [place], [place] [address]    Report Status 06/08/2018 FINAL  Final  Culture, blood (routine x 2)     Status: None   Collection Time: 06/03/18  3:25 PM  Result Value Ref Range Status   Specimen Description BLOOD BLOOD RIGHT HAND  Final   Special Requests   Final    BOTTLES DRAWN AEROBIC ONLY Blood Culture results may not be optimal due to an inadequate volume of blood received in culture bottles   Culture   Final    NO GROWTH 5 DAYS Performed at [facility], [place] [address], [place], [place] [address]    Report Status 06/08/2018 FINAL  Final  Culture, blood (routine x 2)     Status: None   Collection Time: 06/04/18  3:00 PM  Result Value Ref Range Status   Specimen Description BLOOD RIGHT HAND  Final   Special Requests   Final    BOTTLES DRAWN AEROBIC ONLY Blood Culture results may not be optimal due to an inadequate  volume of blood received in culture bottles   Culture   Final    NO GROWTH 5 DAYS Performed at [facility], [place] [address], [place], [place] [address]    Report Status 06/09/2018 FINAL  Final  Culture, blood (routine x 2)     Status: None   Collection Time: 06/04/18  3:05 PM  Result Value Ref Range Status   Specimen Description BLOOD RIGHT HAND  Final   Special Requests   Final    BOTTLES DRAWN AEROBIC ONLY Blood Culture results may not be optimal due to an inadequate volume of blood received in culture bottles   Culture   Final    NO GROWTH 5 DAYS Performed at [facility], [place] [address], [place], [place] [address]    Report Status 06/09/2018 FINAL  Final     Labs: CBC: Recent Labs  Lab 06/08/18 0229 06/09/18 1152 06/10/18 0236 06/11/18 0223 06/12/18 0211  WBC 4.3 3.6* 3.1* 3.7* 3.1*  NEUTROABS 2.1 1.3* 1.2* 1.0* 0.7*  HGB 7.7* 8.2* 7.2* 7.7* 7.6*  HCT 25.2* 27.1* 24.5* 26.1* 25.8*  MCV 89.7 89.4 91.1 90.6 90.8  PLT 48* 59* 57* 63* 65*   Basic Metabolic Panel: Recent Labs  Lab 06/06/18 0213 06/08/18 0229 06/11/18 0817  NA 139 138 137  K 5.3* 4.4 4.7  CL 103 100 100  CO2 26 28 28   GLUCOSE 139* 135* 122*  BUN 41* 36* 32*  CREATININE 1.99* 1.74* 1.68*  CALCIUM 8.4* 8.2* 8.3*  MG  --  2.2  --    Liver Function Tests: Recent Labs  Lab 06/08/18 0229  AST 23  ALT 34  ALKPHOS 64  BILITOT 0.5  PROT 6.9  ALBUMIN 1.6*   No results for input(s): LIPASE, AMYLASE in the last 168 hours. No results for input(s): AMMONIA in the last 168 hours. Cardiac Enzymes: No results for input(s): CKTOTAL, CKMB, CKMBINDEX, TROPONINI in the last 168 hours. BNP (last 3 results) Recent Labs    02/19/18 0635 04/20/18 4132 05/01/18 4401  BNP 820.7* 575.0* 525.5*   CBG: Recent Labs  Lab 06/11/18 1617 06/11/18 2107 06/12/18 0721 06/12/18 1134 06/12/18 1607  GLUCAP 161* 216* 111* 175* 177*   Time spent: 35 minutes  Signed:  Berle Mull  [facility]  06/12/2018  , 4:56 PM

## 2018-06-13 ENCOUNTER — Telehealth: Payer: Self-pay | Admitting: Hematology and Oncology

## 2018-06-13 ENCOUNTER — Inpatient Hospital Stay: Payer: Medicare Other

## 2018-06-13 ENCOUNTER — Encounter: Payer: Self-pay | Admitting: Hematology and Oncology

## 2018-06-13 ENCOUNTER — Inpatient Hospital Stay (HOSPITAL_BASED_OUTPATIENT_CLINIC_OR_DEPARTMENT_OTHER): Payer: Medicare Other | Admitting: Hematology and Oncology

## 2018-06-13 VITALS — BP 115/54 | HR 91 | Temp 97.6°F | Resp 18 | Ht 69.0 in | Wt 176.0 lb

## 2018-06-13 DIAGNOSIS — C61 Malignant neoplasm of prostate: Secondary | ICD-10-CM

## 2018-06-13 DIAGNOSIS — N183 Chronic kidney disease, stage 3 (moderate): Secondary | ICD-10-CM

## 2018-06-13 DIAGNOSIS — D539 Nutritional anemia, unspecified: Secondary | ICD-10-CM

## 2018-06-13 DIAGNOSIS — Z7189 Other specified counseling: Secondary | ICD-10-CM

## 2018-06-13 DIAGNOSIS — D46C Myelodysplastic syndrome with isolated del(5q) chromosomal abnormality: Secondary | ICD-10-CM | POA: Diagnosis not present

## 2018-06-13 DIAGNOSIS — D469 Myelodysplastic syndrome, unspecified: Secondary | ICD-10-CM

## 2018-06-13 DIAGNOSIS — D631 Anemia in chronic kidney disease: Secondary | ICD-10-CM

## 2018-06-13 DIAGNOSIS — J9601 Acute respiratory failure with hypoxia: Secondary | ICD-10-CM

## 2018-06-13 DIAGNOSIS — D696 Thrombocytopenia, unspecified: Secondary | ICD-10-CM

## 2018-06-13 DIAGNOSIS — Z8546 Personal history of malignant neoplasm of prostate: Secondary | ICD-10-CM | POA: Diagnosis not present

## 2018-06-13 DIAGNOSIS — D693 Immune thrombocytopenic purpura: Secondary | ICD-10-CM | POA: Diagnosis not present

## 2018-06-13 LAB — CBC WITH DIFFERENTIAL/PLATELET
Abs Immature Granulocytes: 0.12 10*3/uL — ABNORMAL HIGH (ref 0.00–0.07)
Basophils Absolute: 0 10*3/uL (ref 0.0–0.1)
Basophils Relative: 0 %
Eosinophils Absolute: 0 10*3/uL (ref 0.0–0.5)
Eosinophils Relative: 0 %
HEMATOCRIT: 23.4 % — AB (ref 39.0–52.0)
Hemoglobin: 7 g/dL — CL (ref 13.0–17.0)
Immature Granulocytes: 4 %
Lymphocytes Relative: 53 %
Lymphs Abs: 1.5 10*3/uL (ref 0.7–4.0)
MCH: 27.3 pg (ref 26.0–34.0)
MCHC: 29.9 g/dL — ABNORMAL LOW (ref 30.0–36.0)
MCV: 91.4 fL (ref 80.0–100.0)
Monocytes Absolute: 0.5 10*3/uL (ref 0.1–1.0)
Monocytes Relative: 18 %
Neutro Abs: 0.7 10*3/uL — ABNORMAL LOW (ref 1.7–7.7)
Neutrophils Relative %: 25 %
Platelets: 102 10*3/uL — ABNORMAL LOW (ref 150–400)
RBC: 2.56 MIL/uL — AB (ref 4.22–5.81)
RDW: 19.4 % — ABNORMAL HIGH (ref 11.5–15.5)
WBC Morphology: 2
WBC: 2.9 10*3/uL — ABNORMAL LOW (ref 4.0–10.5)
nRBC: 0 % (ref 0.0–0.2)

## 2018-06-13 LAB — ABO/RH: ABO/RH(D): O POS

## 2018-06-13 LAB — SAMPLE TO BLOOD BANK

## 2018-06-13 LAB — PREPARE RBC (CROSSMATCH)

## 2018-06-13 MED ORDER — ROMIPLOSTIM INJECTION 500 MCG
790.0000 ug | SUBCUTANEOUS | Status: DC
  Administered 2018-06-13: 790 ug via SUBCUTANEOUS
  Filled 2018-06-13: qty 1.58

## 2018-06-13 MED ORDER — HEPARIN SOD (PORK) LOCK FLUSH 100 UNIT/ML IV SOLN
500.0000 [IU] | Freq: Once | INTRAVENOUS | Status: DC
  Filled 2018-06-13: qty 5

## 2018-06-13 MED ORDER — SODIUM CHLORIDE 0.9% FLUSH
10.0000 mL | INTRAVENOUS | Status: DC | PRN
  Administered 2018-06-13: 10 mL via INTRAVENOUS
  Filled 2018-06-13: qty 10

## 2018-06-13 MED ORDER — ACETAMINOPHEN 325 MG PO TABS
ORAL_TABLET | ORAL | Status: AC
  Filled 2018-06-13: qty 2

## 2018-06-13 MED ORDER — DIPHENHYDRAMINE HCL 25 MG PO CAPS
25.0000 mg | ORAL_CAPSULE | Freq: Once | ORAL | Status: AC
  Administered 2018-06-13: 25 mg via ORAL

## 2018-06-13 MED ORDER — DARBEPOETIN ALFA 300 MCG/0.6ML IJ SOSY: 300.0000 ug | PREFILLED_SYRINGE | Freq: Once | INTRAMUSCULAR | Status: DC

## 2018-06-13 MED ORDER — DIPHENHYDRAMINE HCL 25 MG PO CAPS
ORAL_CAPSULE | ORAL | Status: AC
  Filled 2018-06-13: qty 1

## 2018-06-13 MED ORDER — ACETAMINOPHEN 325 MG PO TABS
650.0000 mg | ORAL_TABLET | Freq: Once | ORAL | Status: AC
  Administered 2018-06-13: 650 mg via ORAL

## 2018-06-13 MED ORDER — SODIUM CHLORIDE 0.9% IV SOLUTION
250.0000 mL | Freq: Once | INTRAVENOUS | Status: AC
  Administered 2018-06-13: 250 mL via INTRAVENOUS
  Filled 2018-06-13: qty 250

## 2018-06-13 NOTE — Progress Notes (Signed)
[facility] OFF[facility] PROGRESS NOTE  Patient Care Team: [name], MD as PCP - General (Family Medicine) [name], MD as PCP - Cardiology (Cardiology) Heath Lark, MD as Consulting Physician (Hematology and Oncology) [name], MD as Consulting Physician (Cardiology)  ASSESSMENT & PLAN:  Myelodysplasia (myelodysplastic syndrome) [facility]) He has recurrent infection and numerous hospitalization over the past few months. The patient and family has extensive goals of care discussion with hospitalist, palliative care service and primary care doctor Despite his poor clinical condition, the patient and his wife want to continue aggressive management of his myelodysplasia including transfusion support as needed I will reschedule his appointment next month for him to return here on a weekly basis for blood count monitoring and treatment as indicated I will see him back again in 2 weeks for further follow-up  Deficiency anemia He has multifactorial anemia and is symptomatic from anemia We discussed the risk and benefits of blood transfusion.  The patient and his wife both want to proceed with aggressive transfusion support as indicated I have ordered a unit of blood transfusion for him today  Thrombocytopenia ([place]) I am surprised to see that his platelet count is good today For now, we will focus on transfusion support and will defer Nplate injection until next week  Goals of care, counseling/discussion He had recurrent hospitalization for recurrent pneumonia. Recent CT imaging showed nonspecific nodule, worrisome for malignancy His baseline performance status is poor He had noncurable bone marrow disease At the initial portion of our visit, the patient and his daughter has indicated that he has DNR order However, in the later part of discussion, he indicated he wants everything done.  His wife wanted him to get all treatment possible to keep him alive. I will meet  with him and family again in 2 weeks for further discussion about goals of care.   Orders Placed This Encounter  Procedures  . Prepare RBC    Standing Status:   Standing    Number of Occurrences:   1    Order Specific Question:   # of Units    Answer:   1 unit    Order Specific Question:   Transfusion Indications    Answer:   Symptomatic Anemia    Order Specific Question:   Special Requirements    Answer:   Irradiated    Order Specific Question:   If emergent release call blood bank    Answer:   Not emergent release  . Type and screen    INTERVAL HISTORY: Please see below for problem oriented charting. He returns with his wife and daughter for further follow-up.  He was just discharged yesterday after another hospitalization for recurrent infection/pneumonia CT imaging showed lesion of nonspecific nature, worrisome for malignancy. He is weak and frail.  He show up today sitting on the wheelchair with oxygen concentrator for shortness of breath. He denies fever or chills since discharge from the [facility]. The patient denies any recent signs or symptoms of bleeding such as spontaneous epistaxis, hematuria or hematochezia.  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    04/02/2015 - 07/01/2016 Chemotherapy    He started taking Promacta daily for thrombocytopenia, stopped due to lack of response    07/15/2016 Miscellaneous    Starting July 15, 2016, he is receiving Nplate injection along with darbepoetin     REVIEW OF SYSTEMS:   Constitutional: Denies fevers, chills or abnormal weight loss Eyes: Denies blurriness of vision Ears, nose, mouth, throat, and face: Denies mucositis or sore throat Cardiovascular: Denies palpitation, chest discomfort or lower  extremity swelling Gastrointestinal:  Denies nausea, heartburn or change in bowel habits Skin: Denies abnormal skin rashes Lymphatics: Denies new lymphadenopathy or easy bruising Neurological:Denies numbness, tingling or new weaknesses Behavioral/Psych: Mood is stable, no new changes  All other systems were reviewed with the patient and are negative.  I have reviewed the past medical history, past surgical history, social history and family history with the patient and they are unchanged from previous note.  ALLERGIES:  is allergic to glucophage [metformin hydrochloride]; zetia [ezetimibe]; fenofibrate; and niacin-lovastatin er.  MEDICATIONS:  Current Outpatient Medications  Medication Sig Dispense Refill  . acetaminophen (TYLENOL) 500 MG tablet Take 1,000 mg by mouth every 6 (six) hours as needed for mild pain, moderate pain or headache.     Marland Kitchen aspirin 81 MG EC tablet Take 1 tablet (81 mg total) by mouth daily. 30 tablet 0  . clopidogrel (PLAVIX) 75 MG tablet TAKE 1 TABLET BY MOUTH ONCE A DAY WITH BREAKFAST (Patient taking differently: Take 75 mg by mouth daily. ) 90 tablet 3  . feeding supplement, ENSURE ENLIVE, (ENSURE ENLIVE) LIQD Take 237 mLs by mouth 2 (two) times daily between meals. 237 mL 12  . gabapentin (NEURONTIN) 100 MG capsule TAKE 1 CAPSULE BY MOUTH AT BEDTIME (Patient taking differently: Take 100 mg by mouth at bedtime. ) 90 capsule 3  . Insulin Glargine (LANTUS SOLOSTAR) 100 UNIT/ML Solostar Pen INJECT 20 UNITS EVERY NIGHT AT BEDTIME (Patient taking differently: Inject 20 Units into the skin every morning. INJECT 20 UNITS EVERY NIGHT AT BEDTIME) 5 pen 3  . loperamide (IMODIUM) 2 MG capsule Take 2 mg by mouth as needed for diarrhea or loose stools.    . meclizine (ANTIVERT) 12.5 MG tablet Take 1 tablet (12.5 mg total) by mouth 3 (three) times daily as needed for dizziness. 30 tablet 0  . metoprolol succinate (TOPROL-XL) 50 MG 24 hr tablet Take 1 tablet (50 mg total) by mouth  daily. Take with or immediately following a meal. 30 tablet 0  . Multiple Vitamin (MULTIVITAMIN WITH MINERALS) TABS tablet Take 1 tablet by mouth every morning.     . neomycin-bacitracin-polymyxin (NEOSPORIN) ointment Apply 1 application topically as needed for wound care (finger).    . nitroGLYCERIN (NITROSTAT) 0.4 MG SL tablet Place 1 tablet (0.4 mg total) under the tongue every 5 (five) minutes as needed for chest pain. 100 tablet 3  . NON FORMULARY Inject 1 Dose into the vein once a week. Platelet booster    . OVER THE COUNTER MEDICATION Place 1 drop into both eyes daily as needed (dry eyes). Over the counter lubricating eye drop    . polyethylene glycol (MIRALAX / GLYCOLAX) packet Take 17 g by mouth daily. 14 each 0  . pravastatin (PRAVACHOL) 40 MG tablet Take 1 tablet (40 mg total) by mouth daily at 6 PM. 30 tablet 0  . tamsulosin (FLOMAX) 0.4 MG CAPS capsule TAKE 1 CAPSULE BY MOUTH ONCE DAILY (Patient taking differently: Take 0.4 mg by mouth daily. ) 90 capsule 3  . traMADol (ULTRAM) 50 MG tablet Take 1 tablet (50 mg total) by mouth every 6 (six) hours as needed for moderate pain. 60 tablet 0   Current Facility-Administered Medications  Medication Dose Route Frequency Provider Last Rate Last Dose  . cyanocobalamin ((VITAMIN B-12)) injection 1,000 mcg  1,000 mcg Subcutaneous Q30 days [name], MD   1,000 mcg at 05/11/18 1223   Facility-Administered Medications Ordered in Other Visits  Medication Dose Route Frequency Provider Last Rate Last Dose  . Darbepoetin Alfa (ARANESP) injection 300 mcg  300 mcg Subcutaneous Once [name], Kiwan Gadsden, MD      . romiPLOStim (NPLATE) injection 790 mcg  790 mcg Subcutaneous Weekly Amine Adelson, MD        PHYSICAL EXAMINATION: ECOG PERFORMANCE STATUS: 3 - Symptomatic, >50% confined to bed  Vitals:   06/13/18 1132  BP: (!) 115/54  Pulse: 91  Resp: 18  Temp: 97.6 F (36.4 C)  SpO2: 100%   Filed Weights   06/13/18 1132  Weight: 176 lb (79.8 kg)     GENERAL:alert, no distress and comfortable.  He appears frail, sitting on the wheelchair with oxygen in situ SKIN: skin color, texture, turgor are normal, no rashes or significant lesions.  Noted extensive skin bruises EYES: normal, Conjunctiva are pink and non-injected, sclera clear OROPHARYNX:no exudate, no erythema and lips, buccal mucosa, and tongue normal  NECK: supple, thyroid normal size, non-tender, without nodularity LYMPH:  no palpable lymphadenopathy in the cervical, axillary or inguinal LUNGS: Reduced breath sounds throughout his lungs  hEART: regular rate & rhythm and no murmurs with moderate bilateral lower extremity edema ABDOMEN:abdomen soft, non-tender and normal bowel sounds Musculoskeletal:no cyanosis of digits and no clubbing  NEURO: alert & oriented x 3 with fluent speech, no focal motor/sensory deficits  LABORATORY DATA:  I have reviewed the data as listed    Component Value Date/Time   NA 137 06/11/2018 0817   NA 138 03/02/2016 1238   K 4.7 06/11/2018 0817   K 4.6 03/02/2016 1238   CL 100 06/11/2018 0817   CL 107 09/27/2012 0940   CO2 28 06/11/2018 0817   CO2 25 03/02/2016 1238   GLUCOSE 122 (H) 06/11/2018 0817   GLUCOSE 163 (H) 03/02/2016 1238   GLUCOSE 89 09/27/2012 0940   BUN 32 (H) 06/11/2018 0817   BUN 20.9 03/02/2016 1238   CREATININE 1.68 (H) 06/11/2018 0817   CREATININE 2.21 (H) 03/13/2018 1407   CREATININE 1.8 (H) 03/02/2016 1238   CALCIUM 8.3 (L) 06/11/2018 0817   CALCIUM 8.4 03/02/2016 1238   PROT 6.9 06/08/2018 0229   PROT 8.2 03/02/2016 1238   ALBUMIN 1.6 (L) 06/08/2018 0229   ALBUMIN 2.6 (L) 03/02/2016 1238   AST 23 06/08/2018 0229   AST 21 03/02/2016 1238   ALT 34 06/08/2018 0229   ALT 29 03/02/2016 1238   ALKPHOS 64 06/08/2018 0229   ALKPHOS 84 03/02/2016 1238   BILITOT 0.5 06/08/2018 0229   BILITOT <0.30 03/02/2016 1238   GFRNONAA 36 (L) 06/11/2018 0817   GFRNONAA 25 (L) 02/28/2018 1648   GFRAA 42 (L) 06/11/2018 0817    GFRAA 29 (L) 02/28/2018 1648    No results found for: SPEP, UPEP  Lab Results  Component Value Date   WBC 2.9 (L) 06/13/2018   NEUTROABS 0.7 (L) 06/13/2018   HGB 7.0 (LL) 06/13/2018   HCT 23.4 (L) 06/13/2018   MCV 91.4 06/13/2018   PLT 102 (L) 06/13/2018      Chemistry      Component Value Date/Time   NA 137 06/11/2018 0817   NA 138 03/02/2016 1238   K 4.7 06/11/2018 0817   K 4.6 03/02/2016 1238   CL  100 06/11/2018 0817   CL 107 09/27/2012 0940   CO2 28 06/11/2018 0817   CO2 25 03/02/2016 1238   BUN 32 (H) 06/11/2018 0817   BUN 20.9 03/02/2016 1238   CREATININE 1.68 (H) 06/11/2018 0817   CREATININE 2.21 (H) 03/13/2018 1407   CREATININE 1.8 (H) 03/02/2016 1238      Component Value Date/Time   CALCIUM 8.3 (L) 06/11/2018 0817   CALCIUM 8.4 03/02/2016 1238   ALKPHOS 64 06/08/2018 0229   ALKPHOS 84 03/02/2016 1238   AST 23 06/08/2018 0229   AST 21 03/02/2016 1238   ALT 34 06/08/2018 0229   ALT 29 03/02/2016 1238   BILITOT 0.5 06/08/2018 0229   BILITOT <0.30 03/02/2016 1238       RADIOGRAPHIC STUDIES: I have personally reviewed the radiological images as listed and agreed with the findings in the report. Ct Abdomen Pelvis Wo Contrast  Result Date: 05/31/2018 CLINICAL DATA:  Bacteremia. EXAM: CT ABDOMEN AND PELVIS WITHOUT CONTRAST TECHNIQUE: Multidetector CT imaging of the abdomen and pelvis was performed following the standard protocol without IV contrast. COMPARISON:  CT scan of April 14, 2018. FINDINGS: Lower chest: Mild right pleural effusion is noted with adjacent subsegmental atelectasis. Hepatobiliary: No focal liver abnormality is seen. No gallstones, gallbladder wall thickening, or biliary dilatation. Pancreas: Unremarkable. No pancreatic ductal dilatation or surrounding inflammatory changes. Spleen: Normal in size without focal abnormality. Adrenals/Urinary Tract: Adrenal glands appear normal. Stable exophytic right renal cyst is noted. No hydronephrosis or  renal obstruction is noted. No renal or ureteral calculi are noted. Urinary bladder is unremarkable. Stomach/Bowel: Stomach is within normal limits. Appendix appears normal. No evidence of bowel wall thickening, distention, or inflammatory changes. Sigmoid diverticulosis is noted without inflammation. Vascular/Lymphatic: Aortic atherosclerosis. No enlarged abdominal or pelvic lymph nodes. Reproductive: Status post prostatic brachytherapy seed placement. Other: Large fat containing right inguinal hernia is noted which extends into the scrotum. This is stable compared to prior exam. Musculoskeletal: No acute or significant osseous findings. IMPRESSION: Mild right pleural effusion is noted with adjacent subsegmental atelectasis. Sigmoid diverticulosis without inflammation. Status post prostatic brachytherapy seed placement. Stable large fat containing right inguinal hernia is noted which extends into the scrotum. Aortic Atherosclerosis (ICD10-I70.0). Electronically Signed   By: Marijo Conception, M.D.   On: 05/31/2018 12:50   Dg Chest 1 View  Result Date: 06/03/2018 CLINICAL DATA:  Post right-sided thoracentesis EXAM: CHEST  1 VIEW COMPARISON:  05/31/2018; chest CT-06/01/2018 FINDINGS: Grossly unchanged cardiac silhouette and mediastinal contours with unchanged appearance of mixed heterogeneous and consolidative right upper lobe mass with partial obscuration of the right paratracheal stripe. Unchanged appearance of known left upper lobe spiculated nodule. Pulmonary venous congestion without frank evidence of edema. Interval reduction/resolution of right-sided pleural effusion post thoracentesis. No pneumothorax. Unchanged trace left-sided pleural effusion. No acute osseous abnormalities. IMPRESSION: 1. Interval reduction/resolution of right-sided pleural effusion post thoracentesis. No pneumothorax. 2. Otherwise similar appearance of the chest including dominant consolidative/heterogeneous right upper lobe mass  again worrisome for infection versus malignancy. 3. Unchanged slightly spiculated left upper lobe pulmonary nodule. 4. Pulmonary venous congestion without frank evidence of edema. Electronically Signed   By: Sandi Mariscal M.D.   On: 06/03/2018 10:45   Dg Chest 2 View  Result Date: 06/05/2018 CLINICAL DATA:  Fever and shortness of breath today. EXAM: CHEST - 2 VIEW COMPARISON:  06/03/2018 FINDINGS: The heart is mildly enlarged but stable. Persistent dense right upper lobe airspace consolidation, likely pneumonia. No recurrent right pleural effusion. Stable  right basilar atelectasis. Persistent small left effusion and left basilar atelectasis. IMPRESSION: Stable dense right upper lobe airspace consolidation. Persistent small left effusion and overlying atelectasis. Electronically Signed   By: Marijo Sanes M.D.   On: 06/05/2018 09:11   Dg Chest 2 View  Result Date: 05/31/2018 CLINICAL DATA:  New onset of fever. The patient has an upset stomach and is coughing. History of CHF, previous CVA, previous episodes of pneumonia, former smoker. EXAM: CHEST - 2 VIEW COMPARISON:  Portable chest x-ray of May 28, 2018 FINDINGS: There's been interval worsening in airspace opacity in the right upper lobe. The right lower lung is clear. The left lung is clear. The heart is top-normal in size. The pulmonary vascularity is not engorged. There is calcification in the wall of the aortic arch. IMPRESSION: Worsening airspace opacity in the right upper lobe compatible with pneumonia. No overt CHF. Thoracic aortic atherosclerosis. Electronically Signed   By: David  Martinique M.D.   On: 05/31/2018 12:09   Ct Chest Wo Contrast  Result Date: 06/01/2018 CLINICAL DATA:  Fever for 2 days. EXAM: CT CHEST WITHOUT CONTRAST TECHNIQUE: Multidetector CT imaging of the chest was performed following the standard protocol without IV contrast. COMPARISON:  Chest x-ray 05/31/2018 FINDINGS: Cardiovascular: Aortic atherosclerosis and diffuse  coronary artery calcifications. Aorta is normal caliber. Heart is normal size. Mediastinum/Nodes: Mildly enlarged mediastinal lymph nodes. Subcarinal lymph node has a short axis diameter of 15 mm. No hilar or visible axillary adenopathy. Lungs/Pleura: Small left effusion and moderate right effusion. Rounded masslike opacity noted in the right upper lobe with surrounding ground-glass disease. Ground-glass disease throughout much of the right upper lobe. Compressive atelectasis in the right lower lobe. Spiculated nodule in the posterior left upper lobe measures 11 mm. Upper Abdomen: Imaging into the upper abdomen shows no acute findings. Musculoskeletal: Chest wall soft tissues are unremarkable. No acute bony abnormality. IMPRESSION: Rounded dense masslike consolidation in the right upper lobe with surrounding ground-glass disease throughout much of the right upper lobe. This could reflect large pulmonary mass/lung cancer or rounded pneumonia. Spiculated 11 mm nodule in the left upper lobe. Can not exclude primary lung cancer or metastasis. Mild mediastinal adenopathy. Moderate right pleural effusion and small left pleural effusion. Diffuse coronary artery disease Aortic Atherosclerosis (ICD10-I70.0). Electronically Signed   By: Rolm Baptise M.D.   On: 06/01/2018 16:29   Dg Chest Port 1 View  Result Date: 05/28/2018 CLINICAL DATA:  Chest pain. Prostate cancer. Lymphoma. EXAM: PORTABLE CHEST 1 VIEW COMPARISON:  05/01/2018 FINDINGS: Artifact overlies the chest. Mild cardiomegaly and aortic atherosclerosis. Newly seen abnormal density in the right upper lobe and in both lower lobes most consistent with pneumonia. Some pleural density noted on the left as well. IMPRESSION: New pulmonary density in the right apex and at both lung bases most consistent with pneumonia. Small associated left effusion. Electronically Signed   By: Nelson Chimes M.D.   On: 05/28/2018 09:07   US Thoracentesis Asp Pleural Space W/img  Guide  Result Date: 06/03/2018 INDICATION: Symptomatic right sided pleural effusion. Please perform ultrasound-guided thoracentesis for therapeutic and diagnostic purposes. EXAM: US THORACENTESIS ASP PLEURAL SPACE W/IMG GUIDE COMPARISON:  Chest CT-06/01/2018 MEDICATIONS: None. COMPLICATIONS: None immediate. TECHNIQUE: Informed written consent was obtained from the patient after a discussion of the risks, benefits and alternatives to treatment. A timeout was performed prior to the initiation of the procedure. Initial ultrasound scanning demonstrates a small anechoic right-sided pleural effusion. The lower chest was prepped and draped in the usual  sterile fashion. 1% lidocaine was used for local anesthesia. Under direct ultrasound guidance, a 19 gauge, 7-cm, Yueh catheter was introduced. An ultrasound image was saved for documentation purposes. The thoracentesis was performed. The catheter was removed and a dressing was applied. The patient tolerated the procedure well without immediate post procedural complication. The patient was escorted to have an upright chest radiograph. FINDINGS: A total of approximately 650 cc of serous fluid was removed. Requested samples were sent to the laboratory. IMPRESSION: Successful ultrasound-guided right sided thoracentesis yielding 650 cc of pleural fluid. Electronically Signed   By: Sandi Mariscal M.D.   On: 06/03/2018 10:46    All questions were answered. The patient knows to call the clinic with any problems, questions or concerns. No barriers to learning was detected.  I spent 30 minutes counseling the patient face to face. The total time spent in the appointment was 40 minutes and more than 50% was on counseling and review of test results  Heath Lark, MD 06/13/2018 12:17 PM

## 2018-06-13 NOTE — Telephone Encounter (Signed)
Gave avs and calendar ° °

## 2018-06-13 NOTE — Assessment & Plan Note (Signed)
I am surprised to see that his platelet count is good today For now, we will focus on transfusion support and will defer Nplate injection until next week

## 2018-06-13 NOTE — Patient Instructions (Signed)
Blood Transfusion, Adult, Care After This sheet gives you information about how to care for yourself after your procedure. Your doctor may also give you more specific instructions. If you have problems or questions, contact your doctor. Follow these instructions at home:   Take over-the-counter and prescription medicines only as told by your doctor.  Go back to your normal activities as told by your doctor.  Follow instructions from your doctor about how to take care of the area where an IV tube was put into your vein (insertion site). Make sure you: ? Wash your hands with soap and water before you change your bandage (dressing). If there is no soap and water, use hand sanitizer. ? Change your bandage as told by your doctor.  Check your IV insertion site every day for signs of infection. Check for: ? More redness, swelling, or pain. ? More fluid or blood. ? Warmth. ? Pus or a bad smell. Contact a doctor if:  You have more redness, swelling, or pain around the IV insertion site.  You have more fluid or blood coming from the IV insertion site.  Your IV insertion site feels warm to the touch.  You have pus or a bad smell coming from the IV insertion site.  Your pee (urine) turns pink, red, or brown.  You feel weak after doing your normal activities. Get help right away if:  You have signs of a serious allergic or body defense (immune) system reaction, including: ? Itchiness. ? Hives. ? Trouble breathing. ? Anxiety. ? Pain in your chest or lower back. ? Fever, flushing, and chills. ? Fast pulse. ? Rash. ? Watery poop (diarrhea). ? Throwing up (vomiting). ? Dark pee. ? Serious headache. ? Dizziness. ? Stiff neck. ? Yellow color in your face or the white parts of your eyes (jaundice). Summary  After a blood transfusion, return to your normal activities as told by your doctor.  Every day, check for signs of infection where the IV tube was put into your vein.  Some  signs of infection are warm skin, more redness and pain, more fluid or blood, and pus or a bad smell where the needle went in.  Contact your doctor if you feel weak or have any unusual symptoms. This information is not intended to replace advice given to you by your health care provider. Make sure you discuss any questions you have with your health care provider. Document Released: 06/28/2014 Document Revised: 01/30/2016 Document Reviewed: 01/30/2016 Elsevier Interactive Patient Education  2019 [employer]  

## 2018-06-13 NOTE — Assessment & Plan Note (Signed)
He had recurrent hospitalization for recurrent pneumonia. Recent CT imaging showed nonspecific nodule, worrisome for malignancy His baseline performance status is poor He had noncurable bone marrow disease At the initial portion of our visit, the patient and his daughter has indicated that he has DNR order However, in the later part of discussion, he indicated he wants everything done.  His wife wanted him to get all treatment possible to keep him alive. I will meet with him and family again in 2 weeks for further discussion about goals of care.

## 2018-06-13 NOTE — Assessment & Plan Note (Signed)
He has multifactorial anemia and is symptomatic from anemia We discussed the risk and benefits of blood transfusion.  The patient and his wife both want to proceed with aggressive transfusion support as indicated I have ordered a unit of blood transfusion for him today

## 2018-06-13 NOTE — Assessment & Plan Note (Signed)
He has recurrent infection and numerous hospitalization over the past few months. The patient and family has extensive goals of care discussion with hospitalist, palliative care service and primary care doctor Despite his poor clinical condition, the patient and his wife want to continue aggressive management of his myelodysplasia including transfusion support as needed I will reschedule his appointment next month for him to return here on a weekly basis for blood count monitoring and treatment as indicated I will see him back again in 2 weeks for further follow-up

## 2018-06-14 LAB — BPAM RBC
Blood Product Expiration Date: 202001202359
ISSUE DATE / TIME: 201912241319
Unit Type and Rh: 5100

## 2018-06-14 LAB — PSA, TOTAL AND FREE
PSA, Free Pct: UNDETERMINED %
Prostate Specific Ag, Serum: <0.1 ng/mL (ref 0.0–4.0)

## 2018-06-14 LAB — TYPE AND SCREEN
ABO/RH(D): O POS
Antibody Screen: NEGATIVE
Unit division: 0

## 2018-06-15 DIAGNOSIS — J90 Pleural effusion, not elsewhere classified: Secondary | ICD-10-CM | POA: Diagnosis not present

## 2018-06-15 DIAGNOSIS — N183 Chronic kidney disease, stage 3 (moderate): Secondary | ICD-10-CM | POA: Diagnosis not present

## 2018-06-15 DIAGNOSIS — J158 Pneumonia due to other specified bacteria: Secondary | ICD-10-CM | POA: Diagnosis not present

## 2018-06-15 DIAGNOSIS — E1122 Type 2 diabetes mellitus with diabetic chronic kidney disease: Secondary | ICD-10-CM | POA: Diagnosis not present

## 2018-06-15 DIAGNOSIS — Z7901 Long term (current) use of anticoagulants: Secondary | ICD-10-CM | POA: Diagnosis not present

## 2018-06-15 DIAGNOSIS — I130 Hypertensive heart and chronic kidney disease with heart failure and stage 1 through stage 4 chronic kidney disease, or unspecified chronic kidney disease: Secondary | ICD-10-CM | POA: Diagnosis not present

## 2018-06-15 DIAGNOSIS — D46C Myelodysplastic syndrome with isolated del(5q) chromosomal abnormality: Secondary | ICD-10-CM | POA: Diagnosis not present

## 2018-06-15 DIAGNOSIS — Z87891 Personal history of nicotine dependence: Secondary | ICD-10-CM | POA: Diagnosis not present

## 2018-06-15 DIAGNOSIS — I5042 Chronic combined systolic (congestive) and diastolic (congestive) heart failure: Secondary | ICD-10-CM | POA: Diagnosis not present

## 2018-06-15 DIAGNOSIS — C8590 Non-Hodgkin lymphoma, unspecified, unspecified site: Secondary | ICD-10-CM | POA: Diagnosis not present

## 2018-06-15 DIAGNOSIS — I2510 Atherosclerotic heart disease of native coronary artery without angina pectoris: Secondary | ICD-10-CM | POA: Diagnosis not present

## 2018-06-15 DIAGNOSIS — Z794 Long term (current) use of insulin: Secondary | ICD-10-CM | POA: Diagnosis not present

## 2018-06-15 DIAGNOSIS — B952 Enterococcus as the cause of diseases classified elsewhere: Secondary | ICD-10-CM | POA: Diagnosis not present

## 2018-06-15 DIAGNOSIS — Z79899 Other long term (current) drug therapy: Secondary | ICD-10-CM | POA: Diagnosis not present

## 2018-06-19 DIAGNOSIS — I5042 Chronic combined systolic (congestive) and diastolic (congestive) heart failure: Secondary | ICD-10-CM | POA: Diagnosis not present

## 2018-06-19 DIAGNOSIS — B952 Enterococcus as the cause of diseases classified elsewhere: Secondary | ICD-10-CM | POA: Diagnosis not present

## 2018-06-19 DIAGNOSIS — J158 Pneumonia due to other specified bacteria: Secondary | ICD-10-CM | POA: Diagnosis not present

## 2018-06-19 DIAGNOSIS — I130 Hypertensive heart and chronic kidney disease with heart failure and stage 1 through stage 4 chronic kidney disease, or unspecified chronic kidney disease: Secondary | ICD-10-CM | POA: Diagnosis not present

## 2018-06-19 DIAGNOSIS — J90 Pleural effusion, not elsewhere classified: Secondary | ICD-10-CM | POA: Diagnosis not present

## 2018-06-19 DIAGNOSIS — I2510 Atherosclerotic heart disease of native coronary artery without angina pectoris: Secondary | ICD-10-CM | POA: Diagnosis not present

## 2018-06-20 ENCOUNTER — Ambulatory Visit (INDEPENDENT_AMBULATORY_CARE_PROVIDER_SITE_OTHER): Payer: Medicare Other | Admitting: Family Medicine

## 2018-06-20 ENCOUNTER — Encounter: Payer: Self-pay | Admitting: Family Medicine

## 2018-06-20 VITALS — BP 130/68 | HR 97 | Temp 97.7°F | Resp 20 | Ht 69.0 in | Wt 180.0 lb

## 2018-06-20 DIAGNOSIS — Z09 Encounter for follow-up examination after completed treatment for conditions other than malignant neoplasm: Secondary | ICD-10-CM

## 2018-06-20 DIAGNOSIS — R911 Solitary pulmonary nodule: Secondary | ICD-10-CM | POA: Diagnosis not present

## 2018-06-20 DIAGNOSIS — E538 Deficiency of other specified B group vitamins: Secondary | ICD-10-CM

## 2018-06-20 DIAGNOSIS — I6522 Occlusion and stenosis of left carotid artery: Secondary | ICD-10-CM | POA: Diagnosis not present

## 2018-06-20 DIAGNOSIS — R918 Other nonspecific abnormal finding of lung field: Secondary | ICD-10-CM

## 2018-06-20 LAB — BASIC METABOLIC PANEL WITH GFR
BUN/Creatinine Ratio: 22 (calc) (ref 6–22)
BUN: 34 mg/dL — ABNORMAL HIGH (ref 7–25)
CO2: 25 mmol/L (ref 20–32)
Calcium: 8.5 mg/dL — ABNORMAL LOW (ref 8.6–10.3)
Chloride: 102 mmol/L (ref 98–110)
Creat: 1.56 mg/dL — ABNORMAL HIGH (ref 0.70–1.11)
GFR, Est African American: 46 mL/min/{1.73_m2} — ABNORMAL LOW (ref >=60)
GFR, Est Non African American: 40 mL/min/{1.73_m2} — ABNORMAL LOW (ref >=60)
Glucose, Bld: 211 mg/dL — ABNORMAL HIGH (ref 65–99)
Potassium: 4.8 mmol/L (ref 3.5–5.3)
Sodium: 137 mmol/L (ref 135–146)

## 2018-06-20 NOTE — Progress Notes (Signed)
Subjective:    Patient ID: Ricardo Watkins, male    DOB: [birth year], 84 y.o.   MRN: [id number]  HPI  Patient was recently admitted to the hospital.  I have copied relevant portions of the discharge summary and included them below for my reference:  Hospital Course:  Summary of his active problems in the hospital is as following. 1.Sepsis/enterococcal bacteremia Right upper lobe pneumonia -Presumed to be secondary to healthcare associated pneumonia, present on admission -Repeat x-ray 12/11 showed worsening opacity in the right upper lobe  -Blood cultures grew enterococcus faecium -Antibiotics transitioned back to Unasyn per infectious disease 12/11 -Infectious disease was consulted recommended 2-week course of IV Unasyn until 12/23, repeat cultures are negative, CT chest with right upper lobe lung mass versus rounded pneumonia no acute abdominal findings  -Unfortunately has had recurrent fevers, - all repeat blood cultures are negative,  - chest x-ray with stable right lung pneumonia, CT abdomen done earlier this admission was unremarkable -ID was reconsulted, felt that the patient's recurrent fever could be secondary to drug fever versus tumor fever but patient's antibiotics were changed to IV meropenem. Fever trending down with meropenem and therefore ID recommended to continue IV meropenem until 06/12/2018. Appreciate ID input. Currently signed off. We will continue antibiotic until June 12, 2018.  - While the CT scan of the chest does make a comment about possible right upper lobe mass it was not present on March 2019 CT scan of the chest thus less likely that the mass has grown up to this big size in the short time. Although the possibility of that cannot be ruled out completely.  I would recommend to repeat another CT scan of the chest in 4 weeks to monitor resolution of the right upper lobe mass like consolidation.  2.Moderate right pleural effusion -Likely  associated with right upper lobe pneumonia vs mass -Underwent thoracentesis in radiology 12/14 with 650 cc fluid drained appears transudate of, negative for malignancy cytology -Unfortunately cultures did not get sent   3. Myelodysplastic syndrome with 5 q. Deletion. -With symptomatic anemia, chronic neutropenia and thrombocytopenia -Transfused 2 units of PRBC for hemoglobin of 6.9 on 12/9, and then another unit of PRBC on 12/13 -On supportive care, transfuse as needed -He is also on Nplate and erythropoietin -Prognosis appears quite poor at this time,  -Earlier provider discussed situation with Dr.Gorsuch, who agrees that he worsen with frequent hospitalizations, recurrent infections and potential need to discuss hospice. -Due to worsening thrombocytopenia discussed with Dr. Hilbert Odor on 06/09/2018.she recommended to trial daily CBC and transfuse for platelets less than 20 or for active bleeding. She does not see any benefit of using Nplate while the patient is dealing with active infection, appreciate her assistance. -She will follow-up with him soon as outpatient, tentatively June 13, 2018  3.CAD/chronic systolic and diastolic CHF -Echocardiogram in 9/2 0 19 showed EF of 30 to 15% with diastolic dysfunction -Repeat echo now showed improvement in EF to 50%, no overt vegetations -Given IV Lasix intermittently  -Currently holding aspirin and Plavix due to thrombocytopenia, resume on discharge since his platelets are more than 60.  4.Acute kidney injury on chronic kidney disease stage IV. Baseline renal function around 1.8-1.7 -Creatinine was 2.4 on admission, trended down to 2.0, back up to 2.2 with worsening BUN as well, now back to 1.7. -likely due to ongoing sepsis -Diuretics were held in the setting of sepsis and high fevers -Diuretics restarted, use as needed   5. Type2Diabetes Mellitus, uncontroled  withrenalcomplicationand neuropathic complication -continue home  regimen -Hb A1c is 7.7  6. Thrombocytopenia -due to MDS, see above, now stable, resume aspirin and Plavix.  7.Goals of care discussion. -Multiple frequent hospitalizations -Seen by palliative care for goals of care discussion, Patient is DNR/DNI -Prognosis appears poor overall, discussed this with daughter at bedside, he would qualify for home hospice however would not be eligible for blood transfusions then, d/w Dr.Gorsuch 12/16, she will discuss poor prognosis and options with family post discharge with quick FU   06/20/18 Patient is here today with his wife and his daughter.  I reviewed his office visit with his oncologist, [name].  I also reviewed the results of his CT scan including the rounded mass in the right upper lobe and showed the patient the clinical images.  We spent the majority of today's discussion on goals of care.  Patient has been admitted multiple times to the hospital several times with bacteremia, recurrent pneumonia, and congestive heart failure.  He also has myelodysplastic syndrome requiring repeat transfusions due to persistent drops in his hemoglobin.  I explained to the patient and his wife that unfortunately this represents his body gradually wearing down.  However the patient and his wife both still want aggressive care at this point and still to continue transfusions and weekly blood checks.  However I did explain that if the mass in his right lung does represent malignancy, he would be unable to tolerate the surgery to treat such mass.  Therefore if repeat CAT scan in 3 weeks shows persistent right-sided mass, I would not recommend any further surgical intervention or biopsy and would instead focus on comfort care.  I believe the patient's daughter understands and is on board.  I believe the patient's wife is slowly coming to the understanding of her husband situation.  In the hospital they cut his metoprolol to 50 mg a day due to hypotension.  They  discontinued his furosemide as well as his Imdur.  He is starting to gain weight and has pitting edema in both legs.  There is no pulmonary edema evidenced on pulmonary exam Past Medical History:  Diagnosis Date  . Adenomatous colon polyp   . CAD (coronary artery disease)    30% LAD Stenosis, 70% ramus intermedius stenosis, treated with PTCA and angioplasty by Dr Albertine Patricia 2004  . Cataract    right eye  . CHF (congestive heart failure) ([place])   . Chronic ITP (idiopathic thrombocytopenia) ([facility])   . Cough, persistent 11/04/2015  . Diverticulosis   . DM (diabetes mellitus) ([place])   . DVT (deep venous thrombosis) ([place])    secondary to surgery  . Dyspnea   . Fever 05/01/2018  . Hyperlipidemia   . Hypertension   . Inguinal hernia    right  . Laceration of finger of right hand 05/01/2018   INDEX FINGER  . 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 Microcytic anemia 07/07/2015  . 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 Non Hodgkin's lymphoma ([place])   . Pleural effusion, left 11/04/2015  . Prostate CA ([place]) 09/10/2011   Gleason 3+3 R, 3+4 L lobe May 2007 Rx Radioactive seed implants Dr [name]  . PVD (peripheral vascular disease) ([place])    rt renal artery stent  . Renal insufficiency   . Thrombocytopenia ([place])   . 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 Thyroid nodule    left lower lobe (annual monitoring).  . Vitamin D deficiency    Past Surgical History:  Procedure Laterality Date  . APPENDECTOMY     patient ?  Marland Kitchen CARDIAC CATHETERIZATION    . CATARACT EXTRACTION    . CHEST TUBE INSERTION Left 02/10/2016   Procedure: INSERTION PLEURAL DRAINAGE CATHETER;  Surgeon: [name], MD;  Location: [facility];  Service: Thoracic;  Laterality: Left;  . CHEST TUBE INSERTION Left 04/08/2016   Procedure: CHEST TUBE INSERTION;  Surgeon: [name],  MD;  Location: [facility];  Service: Thoracic;  Laterality: Left;  . COLONOSCOPY    . CORONARY ANGIOPLASTY    . CYSTOURETHROSCOPY     ROBOTIC ARM NUCLETRON SEED IMPLANTATION OF PROSTATE  . EXPLORATORY LAPAROTOMY     For evaluation of lymphoma  . REMOVAL OF PLEURAL DRAINAGE CATHETER Left 04/08/2016   Procedure: REMOVAL OF PLEURAL DRAINAGE CATHETER;  Surgeon: [name], MD;  Location: [facility] OR;  Service: Thoracic;  Laterality: Left;   Current Outpatient Medications on File Prior to Visit  Medication Sig Dispense Refill  . acetaminophen (TYLENOL) 500 MG tablet Take 1,000 mg by mouth every 6 (six) hours as needed for mild pain, moderate pain or headache.     Marland Kitchen aspirin 81 MG EC tablet Take 1 tablet (81 mg total) by mouth daily. 30 tablet 0  . clopidogrel (PLAVIX) 75 MG tablet TAKE 1 TABLET BY MOUTH ONCE A DAY WITH BREAKFAST 90 tablet 3  . feeding supplement, ENSURE ENLIVE, (ENSURE ENLIVE) LIQD Take 237 mLs by mouth 2 (two) times daily between meals. 237 mL 12  . gabapentin (NEURONTIN) 100 MG capsule TAKE 1 CAPSULE BY MOUTH AT BEDTIME 90 capsule 3  . Insulin Glargine (LANTUS SOLOSTAR) 100 UNIT/ML Solostar Pen INJECT 20 UNITS EVERY NIGHT AT BEDTIME 5 pen 3  . loperamide (IMODIUM) 2 MG capsule Take 2 mg by mouth as needed for diarrhea or loose stools.    . meclizine (ANTIVERT) 12.5 MG tablet Take 1 tablet (12.5 mg total) by mouth 3 (three) times daily as needed for dizziness. 30 tablet 0  . metoprolol succinate (TOPROL-XL) 50 MG 24 hr tablet Take 1 tablet (50 mg total) by mouth daily. Take with or immediately following a meal. 30 tablet 0  . Multiple Vitamin (MULTIVITAMIN WITH MINERALS) TABS tablet Take 1 tablet by mouth every morning.     . neomycin-bacitracin-polymyxin (NEOSPORIN) ointment Apply 1 application topically as needed for wound care (finger).    . nitroGLYCERIN (NITROSTAT) 0.4 MG SL tablet Place 1 tablet (0.4 mg total) under the tongue every 5 (five) minutes as needed for chest pain. 100  tablet 3  . NON FORMULARY Inject 1 Dose into the vein once a week. Platelet booster    . OVER THE COUNTER MEDICATION Place 1 drop into both eyes daily as needed (dry eyes). Over the counter lubricating eye drop    . polyethylene glycol (MIRALAX / GLYCOLAX) packet Take 17 g by mouth daily. 14 each 0  . pravastatin (PRAVACHOL) 40 MG tablet Take 1 tablet (40 mg total) by mouth daily at 6 PM. 30 tablet 0  . tamsulosin (FLOMAX) 0.4 MG CAPS capsule TAKE 1 CAPSULE BY MOUTH ONCE DAILY 90 capsule 3  . traMADol (ULTRAM) 50 MG tablet Take 1 tablet (50 mg total) by mouth every 6 (six) hours as needed for moderate pain. 60 tablet 0   Current Facility-Administered Medications on File Prior  to Visit  Medication Dose Route Frequency Provider Last Rate Last Dose  . cyanocobalamin ((VITAMIN B-12)) injection 1,000 mcg  1,000 mcg Subcutaneous Q30 days [name], MD   1,000 mcg at 06/20/18 1551  . romiPLOStim (NPLATE) injection 790 mcg  790 mcg Subcutaneous Weekly Heath Lark, MD   790 mcg at 06/13/18 1241   Allergies  Allergen Reactions  . Glucophage [Metformin Hydrochloride] Other (See Comments)    Chest pain  . Zetia [Ezetimibe] Other (See Comments)    weakness  . Fenofibrate Rash  . Niacin-Lovastatin Er Rash   Social History   Socioeconomic History  . Marital status: Married    Spouse name: Not on file  . Number of children: 4  . Years of education: Not on file  . Highest education level: Not on file  Occupational History  . Occupation: retired  Scientific laboratory technician  . Financial resource strain: Not on file  . Food insecurity:    Worry: Not on file    Inability: Not on file  . Transportation needs:    Medical: Not on file    Non-medical: Not on file  Tobacco Use  . Smoking status: Former Smoker    Packs/day: 0.50    Years: 20.00    Pack years: 10.00    Types: Pipe, Cigarettes    Last attempt to quit: 06/21/1958    Years since quitting: 60.0  . Smokeless tobacco: Never Used  Substance  and Sexual Activity  . Alcohol use: No    Alcohol/week: 0.0 standard drinks  . Drug use: No  . Sexual activity: Yes    Partners: Female  Lifestyle  . Physical activity:    Days per week: Not on file    Minutes per session: Not on file  . Stress: Not on file  Relationships  . Social connections:    Talks on phone: Not on file    Gets together: Not on file    Attends religious service: Not on file    Active member of club or organization: Not on file    Attends meetings of clubs or organizations: Not on file    Relationship status: Not on file  . Intimate partner violence:    Fear of current or ex partner: Not on file    Emotionally abused: Not on file    Physically abused: Not on file    Forced sexual activity: Not on file  Other Topics Concern  . Not on file  Social History Narrative  . Not on file     Review of Systems  All other systems reviewed and are negative.      Objective:   Physical Exam Vitals signs reviewed.  Constitutional:      Appearance: He is ill-appearing.  HENT:     Nose: No congestion or rhinorrhea.  Cardiovascular:     Rate and Rhythm: Normal rate and regular rhythm.     Heart sounds: Normal heart sounds.  Pulmonary:     Effort: Pulmonary effort is normal.     Breath sounds: No stridor. No wheezing, rhonchi or rales.  Abdominal:     General: Bowel sounds are normal.     Palpations: Abdomen is soft.     Tenderness: There is no abdominal tenderness. There is no guarding.  Musculoskeletal:     Right lower leg: Edema present.     Left lower leg: Edema present.  Neurological:     Mental Status: He is alert.   on oxygen.  Assessment & Plan:  Hospital discharge follow-up - Plan: BASIC METABOLIC PANEL WITH GFR  Vitamin B12 deficiency  Patient received a blood transfusion last week under the care of [name] due to his myelodysplastic syndrome.  He is scheduled to repeat hemoglobin next week to monitor.  I will repeat a CAT  scan of his lungs in 3 weeks to evaluate for resolution of the round mass in the right upper lobe.  If persistent and concerning for malignancy, I would recommend hospice consultation.  Meanwhile I will have the patient resume Lasix 20 mg a day due to the pitting edema in his extremities.  I will check a BMP today and follow-up the patient in 1 week to recheck his respiratory status as well as his kidney function.  Spent more than 25 minutes today with the patient and his wife and his daughter discussing goals of care.  I believe the patient is appropriate for hospice however both the patient and his wife still wish for aggressive treatment and therefore we will continue with the current plan of care and forego hospice consultation at the present time

## 2018-06-22 ENCOUNTER — Other Ambulatory Visit (HOSPITAL_COMMUNITY): Payer: Self-pay | Admitting: Family Medicine

## 2018-06-22 ENCOUNTER — Inpatient Hospital Stay: Payer: Medicare Other

## 2018-06-22 ENCOUNTER — Other Ambulatory Visit: Payer: Self-pay | Admitting: Hematology and Oncology

## 2018-06-22 ENCOUNTER — Inpatient Hospital Stay: Payer: Medicare Other | Attending: Hematology and Oncology

## 2018-06-22 ENCOUNTER — Telehealth: Payer: Self-pay | Admitting: Hematology and Oncology

## 2018-06-22 ENCOUNTER — Telehealth: Payer: Self-pay | Admitting: *Deleted

## 2018-06-22 VITALS — BP 101/44 | HR 92 | Temp 97.9°F | Resp 20

## 2018-06-22 DIAGNOSIS — N183 Chronic kidney disease, stage 3 unspecified: Secondary | ICD-10-CM

## 2018-06-22 DIAGNOSIS — D539 Nutritional anemia, unspecified: Secondary | ICD-10-CM

## 2018-06-22 DIAGNOSIS — I130 Hypertensive heart and chronic kidney disease with heart failure and stage 1 through stage 4 chronic kidney disease, or unspecified chronic kidney disease: Secondary | ICD-10-CM | POA: Diagnosis not present

## 2018-06-22 DIAGNOSIS — D61818 Other pancytopenia: Secondary | ICD-10-CM

## 2018-06-22 DIAGNOSIS — R000 Tachycardia, unspecified: Secondary | ICD-10-CM | POA: Diagnosis not present

## 2018-06-22 DIAGNOSIS — D469 Myelodysplastic syndrome, unspecified: Secondary | ICD-10-CM

## 2018-06-22 DIAGNOSIS — D46C Myelodysplastic syndrome with isolated del(5q) chromosomal abnormality: Secondary | ICD-10-CM

## 2018-06-22 DIAGNOSIS — I5043 Acute on chronic combined systolic (congestive) and diastolic (congestive) heart failure: Secondary | ICD-10-CM | POA: Diagnosis not present

## 2018-06-22 DIAGNOSIS — N184 Chronic kidney disease, stage 4 (severe): Secondary | ICD-10-CM | POA: Diagnosis not present

## 2018-06-22 DIAGNOSIS — D649 Anemia, unspecified: Secondary | ICD-10-CM | POA: Diagnosis not present

## 2018-06-22 DIAGNOSIS — J810 Acute pulmonary edema: Secondary | ICD-10-CM | POA: Diagnosis not present

## 2018-06-22 DIAGNOSIS — J9601 Acute respiratory failure with hypoxia: Secondary | ICD-10-CM

## 2018-06-22 DIAGNOSIS — J9621 Acute and chronic respiratory failure with hypoxia: Secondary | ICD-10-CM | POA: Diagnosis not present

## 2018-06-22 DIAGNOSIS — D631 Anemia in chronic kidney disease: Secondary | ICD-10-CM

## 2018-06-22 DIAGNOSIS — D693 Immune thrombocytopenic purpura: Secondary | ICD-10-CM | POA: Diagnosis not present

## 2018-06-22 DIAGNOSIS — J90 Pleural effusion, not elsewhere classified: Secondary | ICD-10-CM | POA: Diagnosis not present

## 2018-06-22 DIAGNOSIS — D696 Thrombocytopenia, unspecified: Secondary | ICD-10-CM

## 2018-06-22 DIAGNOSIS — J189 Pneumonia, unspecified organism: Secondary | ICD-10-CM | POA: Diagnosis not present

## 2018-06-22 LAB — CBC WITH DIFFERENTIAL/PLATELET
Abs Immature Granulocytes: 0.03 10*3/uL (ref 0.00–0.07)
Basophils Absolute: 0 10*3/uL (ref 0.0–0.1)
Basophils Relative: 0 %
EOS ABS: 0 10*3/uL (ref 0.0–0.5)
Eosinophils Relative: 0 %
HCT: 21.3 % — ABNORMAL LOW (ref 39.0–52.0)
Hemoglobin: 6.4 g/dL — CL (ref 13.0–17.0)
Immature Granulocytes: 1 %
Lymphocytes Relative: 41 %
Lymphs Abs: 1.3 10*3/uL (ref 0.7–4.0)
MCH: 28.1 pg (ref 26.0–34.0)
MCHC: 30 g/dL (ref 30.0–36.0)
MCV: 93.4 fL (ref 80.0–100.0)
Monocytes Absolute: 0.4 10*3/uL (ref 0.1–1.0)
Monocytes Relative: 11 %
Neutro Abs: 1.5 10*3/uL — ABNORMAL LOW (ref 1.7–7.7)
Neutrophils Relative %: 47 %
Platelets: 126 10*3/uL — ABNORMAL LOW (ref 150–400)
RBC: 2.28 MIL/uL — ABNORMAL LOW (ref 4.22–5.81)
RDW: 18.6 % — ABNORMAL HIGH (ref 11.5–15.5)
WBC Morphology: 3
WBC: 3.1 10*3/uL — AB (ref 4.0–10.5)
nRBC: 0 % (ref 0.0–0.2)

## 2018-06-22 LAB — SAMPLE TO BLOOD BANK

## 2018-06-22 MED ORDER — ROMIPLOSTIM INJECTION 500 MCG
790.0000 ug | SUBCUTANEOUS | Status: DC
  Administered 2018-06-22: 790 ug via SUBCUTANEOUS
  Filled 2018-06-22: qty 1.58

## 2018-06-22 MED ORDER — DARBEPOETIN ALFA 300 MCG/0.6ML IJ SOSY
300.0000 ug | PREFILLED_SYRINGE | Freq: Once | INTRAMUSCULAR | Status: AC
  Administered 2018-06-22: 300 ug via SUBCUTANEOUS

## 2018-06-22 NOTE — Telephone Encounter (Signed)
Left message for patient re 1/6 appointment. Date/time/ok per Threasa Beards per 1/2 los.

## 2018-06-22 NOTE — Telephone Encounter (Signed)
Spoke to patient and patients daughter in the flush room. Patient is scheduled for blood transfusion on Saturday at 915. Patient and daughter verbalized an understanding to not remove blue blood bracelet. Confirms date and time of appt.   Per [name] patient to receive both Aranesp and NPlate today.

## 2018-06-23 DIAGNOSIS — I2510 Atherosclerotic heart disease of native coronary artery without angina pectoris: Secondary | ICD-10-CM | POA: Diagnosis not present

## 2018-06-23 DIAGNOSIS — J90 Pleural effusion, not elsewhere classified: Secondary | ICD-10-CM | POA: Diagnosis not present

## 2018-06-23 DIAGNOSIS — I130 Hypertensive heart and chronic kidney disease with heart failure and stage 1 through stage 4 chronic kidney disease, or unspecified chronic kidney disease: Secondary | ICD-10-CM | POA: Diagnosis not present

## 2018-06-23 DIAGNOSIS — B952 Enterococcus as the cause of diseases classified elsewhere: Secondary | ICD-10-CM | POA: Diagnosis not present

## 2018-06-23 DIAGNOSIS — I5042 Chronic combined systolic (congestive) and diastolic (congestive) heart failure: Secondary | ICD-10-CM | POA: Diagnosis not present

## 2018-06-23 DIAGNOSIS — J158 Pneumonia due to other specified bacteria: Secondary | ICD-10-CM | POA: Diagnosis not present

## 2018-06-24 ENCOUNTER — Other Ambulatory Visit: Payer: Self-pay

## 2018-06-24 ENCOUNTER — Inpatient Hospital Stay (HOSPITAL_COMMUNITY)
Admission: EM | Admit: 2018-06-24 | Discharge: 2018-06-28 | DRG: 291 | Disposition: A | Discharge status: Home Health | Payer: Medicare Other | Attending: Family Medicine | Admitting: Family Medicine

## 2018-06-24 ENCOUNTER — Encounter (HOSPITAL_COMMUNITY): Payer: Self-pay | Admitting: Internal Medicine

## 2018-06-24 ENCOUNTER — Emergency Department (HOSPITAL_COMMUNITY): Payer: Medicare Other

## 2018-06-24 DIAGNOSIS — D696 Thrombocytopenia, unspecified: Secondary | ICD-10-CM | POA: Diagnosis not present

## 2018-06-24 DIAGNOSIS — Z8249 Family history of ischemic heart disease and other diseases of the circulatory system: Secondary | ICD-10-CM

## 2018-06-24 DIAGNOSIS — J810 Acute pulmonary edema: Secondary | ICD-10-CM

## 2018-06-24 DIAGNOSIS — Z923 Personal history of irradiation: Secondary | ICD-10-CM

## 2018-06-24 DIAGNOSIS — Z9221 Personal history of antineoplastic chemotherapy: Secondary | ICD-10-CM

## 2018-06-24 DIAGNOSIS — N184 Chronic kidney disease, stage 4 (severe): Secondary | ICD-10-CM | POA: Diagnosis present

## 2018-06-24 DIAGNOSIS — E119 Type 2 diabetes mellitus without complications: Secondary | ICD-10-CM

## 2018-06-24 DIAGNOSIS — Z6826 Body mass index (BMI) 26.0-26.9, adult: Secondary | ICD-10-CM

## 2018-06-24 DIAGNOSIS — J189 Pneumonia, unspecified organism: Secondary | ICD-10-CM | POA: Diagnosis present

## 2018-06-24 DIAGNOSIS — N183 Chronic kidney disease, stage 3 (moderate): Secondary | ICD-10-CM | POA: Diagnosis not present

## 2018-06-24 DIAGNOSIS — I2510 Atherosclerotic heart disease of native coronary artery without angina pectoris: Secondary | ICD-10-CM | POA: Diagnosis present

## 2018-06-24 DIAGNOSIS — F411 Generalized anxiety disorder: Secondary | ICD-10-CM | POA: Diagnosis present

## 2018-06-24 DIAGNOSIS — E1151 Type 2 diabetes mellitus with diabetic peripheral angiopathy without gangrene: Secondary | ICD-10-CM | POA: Diagnosis present

## 2018-06-24 DIAGNOSIS — Z955 Presence of coronary angioplasty implant and graft: Secondary | ICD-10-CM | POA: Diagnosis not present

## 2018-06-24 DIAGNOSIS — Z8673 Personal history of transient ischemic attack (TIA), and cerebral infarction without residual deficits: Secondary | ICD-10-CM

## 2018-06-24 DIAGNOSIS — Z86718 Personal history of other venous thrombosis and embolism: Secondary | ICD-10-CM | POA: Diagnosis not present

## 2018-06-24 DIAGNOSIS — R7881 Bacteremia: Secondary | ICD-10-CM | POA: Diagnosis present

## 2018-06-24 DIAGNOSIS — Z7902 Long term (current) use of antithrombotics/antiplatelets: Secondary | ICD-10-CM

## 2018-06-24 DIAGNOSIS — I447 Left bundle-branch block, unspecified: Secondary | ICD-10-CM | POA: Diagnosis present

## 2018-06-24 DIAGNOSIS — D509 Iron deficiency anemia, unspecified: Secondary | ICD-10-CM | POA: Diagnosis present

## 2018-06-24 DIAGNOSIS — D469 Myelodysplastic syndrome, unspecified: Secondary | ICD-10-CM | POA: Diagnosis present

## 2018-06-24 DIAGNOSIS — E1122 Type 2 diabetes mellitus with diabetic chronic kidney disease: Secondary | ICD-10-CM | POA: Diagnosis present

## 2018-06-24 DIAGNOSIS — Z9841 Cataract extraction status, right eye: Secondary | ICD-10-CM

## 2018-06-24 DIAGNOSIS — J9621 Acute and chronic respiratory failure with hypoxia: Secondary | ICD-10-CM

## 2018-06-24 DIAGNOSIS — Z7982 Long term (current) use of aspirin: Secondary | ICD-10-CM

## 2018-06-24 DIAGNOSIS — R0902 Hypoxemia: Secondary | ICD-10-CM

## 2018-06-24 DIAGNOSIS — E785 Hyperlipidemia, unspecified: Secondary | ICD-10-CM | POA: Diagnosis present

## 2018-06-24 DIAGNOSIS — Z79899 Other long term (current) drug therapy: Secondary | ICD-10-CM

## 2018-06-24 DIAGNOSIS — R000 Tachycardia, unspecified: Secondary | ICD-10-CM | POA: Diagnosis not present

## 2018-06-24 DIAGNOSIS — D693 Immune thrombocytopenic purpura: Secondary | ICD-10-CM | POA: Diagnosis present

## 2018-06-24 DIAGNOSIS — D649 Anemia, unspecified: Secondary | ICD-10-CM

## 2018-06-24 DIAGNOSIS — Z9981 Dependence on supplemental oxygen: Secondary | ICD-10-CM

## 2018-06-24 DIAGNOSIS — Z87891 Personal history of nicotine dependence: Secondary | ICD-10-CM

## 2018-06-24 DIAGNOSIS — R509 Fever, unspecified: Secondary | ICD-10-CM

## 2018-06-24 DIAGNOSIS — Y95 Nosocomial condition: Secondary | ICD-10-CM | POA: Diagnosis present

## 2018-06-24 DIAGNOSIS — Z8042 Family history of malignant neoplasm of prostate: Secondary | ICD-10-CM

## 2018-06-24 DIAGNOSIS — F410 Panic disorder [episodic paroxysmal anxiety] without agoraphobia: Secondary | ICD-10-CM | POA: Diagnosis present

## 2018-06-24 DIAGNOSIS — I10 Essential (primary) hypertension: Secondary | ICD-10-CM | POA: Diagnosis not present

## 2018-06-24 DIAGNOSIS — I5043 Acute on chronic combined systolic (congestive) and diastolic (congestive) heart failure: Secondary | ICD-10-CM | POA: Diagnosis present

## 2018-06-24 DIAGNOSIS — Z888 Allergy status to other drugs, medicaments and biological substances status: Secondary | ICD-10-CM

## 2018-06-24 DIAGNOSIS — I5022 Chronic systolic (congestive) heart failure: Secondary | ICD-10-CM | POA: Diagnosis present

## 2018-06-24 DIAGNOSIS — E440 Moderate protein-calorie malnutrition: Secondary | ICD-10-CM | POA: Diagnosis present

## 2018-06-24 DIAGNOSIS — Z803 Family history of malignant neoplasm of breast: Secondary | ICD-10-CM

## 2018-06-24 DIAGNOSIS — Z8572 Personal history of non-Hodgkin lymphomas: Secondary | ICD-10-CM

## 2018-06-24 DIAGNOSIS — I130 Hypertensive heart and chronic kidney disease with heart failure and stage 1 through stage 4 chronic kidney disease, or unspecified chronic kidney disease: Secondary | ICD-10-CM | POA: Diagnosis present

## 2018-06-24 DIAGNOSIS — Z8546 Personal history of malignant neoplasm of prostate: Secondary | ICD-10-CM

## 2018-06-24 DIAGNOSIS — Z86010 Personal history of colonic polyps: Secondary | ICD-10-CM

## 2018-06-24 DIAGNOSIS — E559 Vitamin D deficiency, unspecified: Secondary | ICD-10-CM | POA: Diagnosis present

## 2018-06-24 DIAGNOSIS — Z515 Encounter for palliative care: Secondary | ICD-10-CM | POA: Diagnosis not present

## 2018-06-24 DIAGNOSIS — Z825 Family history of asthma and other chronic lower respiratory diseases: Secondary | ICD-10-CM

## 2018-06-24 DIAGNOSIS — Z79891 Long term (current) use of opiate analgesic: Secondary | ICD-10-CM

## 2018-06-24 DIAGNOSIS — Z807 Family history of other malignant neoplasms of lymphoid, hematopoietic and related tissues: Secondary | ICD-10-CM

## 2018-06-24 DIAGNOSIS — Z66 Do not resuscitate: Secondary | ICD-10-CM | POA: Diagnosis present

## 2018-06-24 DIAGNOSIS — Z833 Family history of diabetes mellitus: Secondary | ICD-10-CM

## 2018-06-24 DIAGNOSIS — Z794 Long term (current) use of insulin: Secondary | ICD-10-CM

## 2018-06-24 DIAGNOSIS — J90 Pleural effusion, not elsewhere classified: Secondary | ICD-10-CM | POA: Diagnosis not present

## 2018-06-24 LAB — CREATININE, SERUM
Creatinine, Ser: 2.05 mg/dL — ABNORMAL HIGH (ref 0.61–1.24)
GFR calc Af Amer: 33 mL/min — ABNORMAL LOW (ref >60)
GFR, EST NON AFRICAN AMERICAN: 29 mL/min — AB (ref >60)

## 2018-06-24 LAB — MRSA PCR SCREENING: MRSA by PCR: NEGATIVE

## 2018-06-24 LAB — CBC
HCT: 24.6 % — ABNORMAL LOW (ref 39.0–52.0)
Hemoglobin: 7.4 g/dL — ABNORMAL LOW (ref 13.0–17.0)
MCH: 28.1 pg (ref 26.0–34.0)
MCHC: 30.1 g/dL (ref 30.0–36.0)
MCV: 93.5 fL (ref 80.0–100.0)
Platelets: 121 10*3/uL — ABNORMAL LOW (ref 150–400)
RBC: 2.63 MIL/uL — AB (ref 4.22–5.81)
RDW: 18 % — ABNORMAL HIGH (ref 11.5–15.5)
WBC: 4.9 10*3/uL (ref 4.0–10.5)
nRBC: 0 % (ref 0.0–0.2)

## 2018-06-24 LAB — CBC WITH DIFFERENTIAL/PLATELET
Abs Immature Granulocytes: 0.06 10*3/uL (ref 0.00–0.07)
Basophils Absolute: 0 10*3/uL (ref 0.0–0.1)
Basophils Relative: 0 %
Eosinophils Absolute: 0 10*3/uL (ref 0.0–0.5)
Eosinophils Relative: 0 %
HCT: 20.2 % — ABNORMAL LOW (ref 39.0–52.0)
Hemoglobin: 6.1 g/dL — CL (ref 13.0–17.0)
Immature Granulocytes: 2 %
Lymphocytes Relative: 36 %
Lymphs Abs: 1.2 10*3/uL (ref 0.7–4.0)
MCH: 28.8 pg (ref 26.0–34.0)
MCHC: 30.2 g/dL (ref 30.0–36.0)
MCV: 95.3 fL (ref 80.0–100.0)
Monocytes Absolute: 0.3 10*3/uL (ref 0.1–1.0)
Monocytes Relative: 9 %
NRBC: 0 % (ref 0.0–0.2)
Neutro Abs: 1.8 10*3/uL (ref 1.7–7.7)
Neutrophils Relative %: 53 %
Platelets: 110 10*3/uL — ABNORMAL LOW (ref 150–400)
RBC: 2.12 MIL/uL — AB (ref 4.22–5.81)
RDW: 18.4 % — AB (ref 11.5–15.5)
WBC: 3.4 10*3/uL — AB (ref 4.0–10.5)

## 2018-06-24 LAB — I-STAT CHEM 8, ED
BUN: 36 mg/dL — ABNORMAL HIGH (ref 8–23)
CALCIUM ION: 1.18 mmol/L (ref 1.15–1.40)
Chloride: 101 mmol/L (ref 98–111)
Creatinine, Ser: 1.8 mg/dL — ABNORMAL HIGH (ref 0.61–1.24)
Glucose, Bld: 234 mg/dL — ABNORMAL HIGH (ref 70–99)
HEMATOCRIT: 20 % — AB (ref 39.0–52.0)
Hemoglobin: 6.8 g/dL — CL (ref 13.0–17.0)
Potassium: 4.6 mmol/L (ref 3.5–5.1)
Sodium: 137 mmol/L (ref 135–145)
TCO2: 26 mmol/L (ref 22–32)

## 2018-06-24 LAB — GLUCOSE, CAPILLARY
Glucose-Capillary: 153 mg/dL — ABNORMAL HIGH (ref 70–99)
Glucose-Capillary: 134 mg/dL — ABNORMAL HIGH (ref 70–99)

## 2018-06-24 LAB — PREPARE RBC (CROSSMATCH)

## 2018-06-24 LAB — I-STAT TROPONIN, ED: Troponin i, poc: 0.46 ng/mL (ref 0.00–0.08)

## 2018-06-24 LAB — ABO/RH: ABO/RH(D): O POS

## 2018-06-24 MED ORDER — SODIUM CHLORIDE 0.9% FLUSH: 3.0000 mL | INTRAVENOUS | Status: DC | PRN

## 2018-06-24 MED ORDER — METOPROLOL TARTRATE 5 MG/5ML IV SOLN: 5.0000 mg | Freq: Four times a day (QID) | INTRAVENOUS | Status: DC | PRN

## 2018-06-24 MED ORDER — INSULIN ASPART 100 UNIT/ML ~~LOC~~ SOLN
0.0000 [IU] | SUBCUTANEOUS | Status: DC
  Administered 2018-06-24: 1 [IU] via SUBCUTANEOUS
  Administered 2018-06-24: 2 [IU] via SUBCUTANEOUS

## 2018-06-24 MED ORDER — NITROGLYCERIN 0.4 MG SL SUBL: 0.4000 mg | SUBLINGUAL_TABLET | SUBLINGUAL | Status: DC | PRN

## 2018-06-24 MED ORDER — ONDANSETRON HCL 4 MG PO TABS: 4.0000 mg | ORAL_TABLET | Freq: Four times a day (QID) | ORAL | Status: DC | PRN

## 2018-06-24 MED ORDER — ACETAMINOPHEN 325 MG PO TABS
650.0000 mg | ORAL_TABLET | Freq: Once | ORAL | Status: AC
  Administered 2018-06-24: 650 mg via ORAL
  Filled 2018-06-24: qty 2

## 2018-06-24 MED ORDER — POLYETHYLENE GLYCOL 3350 17 G PO PACK
17.0000 g | PACK | Freq: Every day | ORAL | Status: DC
  Administered 2018-06-26 – 2018-06-28 (×3): 17 g via ORAL
  Filled 2018-06-24 (×3): qty 1

## 2018-06-24 MED ORDER — GABAPENTIN 100 MG PO CAPS
100.0000 mg | ORAL_CAPSULE | Freq: Every day | ORAL | Status: DC
  Administered 2018-06-25: 100 mg via ORAL
  Filled 2018-06-24 (×3): qty 1

## 2018-06-24 MED ORDER — INSULIN GLARGINE 100 UNIT/ML ~~LOC~~ SOLN
5.0000 [IU] | Freq: Every day | SUBCUTANEOUS | Status: DC
  Administered 2018-06-24 – 2018-06-25 (×2): 5 [IU] via SUBCUTANEOUS
  Filled 2018-06-24 (×3): qty 0.05

## 2018-06-24 MED ORDER — FUROSEMIDE 10 MG/ML IJ SOLN
80.0000 mg | Freq: Two times a day (BID) | INTRAMUSCULAR | Status: DC
  Administered 2018-06-24: 80 mg via INTRAVENOUS
  Filled 2018-06-24: qty 8

## 2018-06-24 MED ORDER — FUROSEMIDE 10 MG/ML IJ SOLN
80.0000 mg | Freq: Once | INTRAMUSCULAR | Status: AC
  Administered 2018-06-24: 80 mg via INTRAVENOUS
  Filled 2018-06-24: qty 8

## 2018-06-24 MED ORDER — METOPROLOL SUCCINATE ER 25 MG PO TB24
50.0000 mg | ORAL_TABLET | Freq: Every day | ORAL | Status: DC
  Administered 2018-06-25 – 2018-06-26 (×2): 50 mg via ORAL
  Filled 2018-06-24 (×2): qty 2

## 2018-06-24 MED ORDER — LORAZEPAM 2 MG/ML IJ SOLN
0.2500 mg | Freq: Once | INTRAMUSCULAR | Status: AC
  Administered 2018-06-24: 0.25 mg via INTRAVENOUS
  Filled 2018-06-24: qty 1

## 2018-06-24 MED ORDER — NITROGLYCERIN 0.4 MG SL SUBL: 0.4000 mg | SUBLINGUAL_TABLET | Freq: Once | SUBLINGUAL | Status: DC

## 2018-06-24 MED ORDER — CLOPIDOGREL BISULFATE 75 MG PO TABS
75.0000 mg | ORAL_TABLET | Freq: Every day | ORAL | Status: DC
  Administered 2018-06-25 – 2018-06-28 (×4): 75 mg via ORAL
  Filled 2018-06-24 (×4): qty 1

## 2018-06-24 MED ORDER — PRAVASTATIN SODIUM 40 MG PO TABS
40.0000 mg | ORAL_TABLET | Freq: Every day | ORAL | Status: DC
  Administered 2018-06-25 – 2018-06-27 (×3): 40 mg via ORAL
  Filled 2018-06-24: qty 1
  Filled 2018-06-24 (×2): qty 2

## 2018-06-24 MED ORDER — TAMSULOSIN HCL 0.4 MG PO CAPS
0.4000 mg | ORAL_CAPSULE | Freq: Every day | ORAL | Status: DC
  Administered 2018-06-25 – 2018-06-28 (×4): 0.4 mg via ORAL
  Filled 2018-06-24 (×4): qty 1

## 2018-06-24 MED ORDER — ONDANSETRON HCL 4 MG/2ML IJ SOLN: 4.0000 mg | Freq: Four times a day (QID) | INTRAMUSCULAR | Status: DC | PRN

## 2018-06-24 MED ORDER — FUROSEMIDE 10 MG/ML IJ SOLN
20.0000 mg | Freq: Once | INTRAMUSCULAR | Status: AC
  Administered 2018-06-24: 20 mg via INTRAVENOUS
  Filled 2018-06-24: qty 4

## 2018-06-24 MED ORDER — HEPARIN SODIUM (PORCINE) 5000 UNIT/ML IJ SOLN
5000.0000 [IU] | Freq: Three times a day (TID) | INTRAMUSCULAR | Status: DC
  Administered 2018-06-24 – 2018-06-28 (×11): 5000 [IU] via SUBCUTANEOUS
  Filled 2018-06-24 (×11): qty 1

## 2018-06-24 MED ORDER — FUROSEMIDE 10 MG/ML IJ SOLN: 40.0000 mg | Freq: Once | INTRAMUSCULAR | Status: DC

## 2018-06-24 MED ORDER — SODIUM CHLORIDE 0.9 % IV SOLN: 250.0000 mL | INTRAVENOUS | Status: DC | PRN

## 2018-06-24 MED ORDER — SODIUM CHLORIDE 0.9% FLUSH
3.0000 mL | Freq: Two times a day (BID) | INTRAVENOUS | Status: DC
  Administered 2018-06-24 – 2018-06-28 (×8): 3 mL via INTRAVENOUS

## 2018-06-24 MED ORDER — SODIUM CHLORIDE 0.9% IV SOLUTION
Freq: Once | INTRAVENOUS | Status: AC
  Administered 2018-06-24: 11:00:00 via INTRAVENOUS

## 2018-06-24 MED ORDER — ASPIRIN EC 81 MG PO TBEC
81.0000 mg | DELAYED_RELEASE_TABLET | Freq: Every day | ORAL | Status: DC
  Administered 2018-06-25 – 2018-06-28 (×4): 81 mg via ORAL
  Filled 2018-06-24 (×4): qty 1

## 2018-06-24 NOTE — ED Triage Notes (Signed)
Pt arrives POV from home. Pt had blood work at [facility] center Thursday and was told his hemoglobin was 6.4. Pt has had 5 transfusions since September. Pt was to have a transfusion on Monday but was unable to wait. Pt reports chest tightness, increased SOB and fatigue.

## 2018-06-24 NOTE — H&P (Addendum)
History and Physical  Ricardo Watkins TKP:[id number] DOB: Jul 09, 1930 DOA: 06/24/2018  PCP: [name], MD Patient coming from: Home  I have personally briefly reviewed patient's old medical records in [place]   Chief Complaint: SOb  HPI: Ricardo Watkins is a 85 y.o. male past medical history of chronic diastolic heart failure, significant peripheral vascular disease, remote history of prostate cancer, status post renal artery stenting, chronic ITP, on chronic home O2 CAD status post stenting, recently deemed not an intervention candidate,, myelodysplastic syndrome with 5 q. deletion, will get frequent checks of her hemoglobin with his last one on Thursday where his hemoglobin was 6.4 who gets frequent transfusions every 2 weeks recently discharged from the hospital on 06/12/2018 for sepsis due to enterococcal bacteremia and right upper lobe pneumonia, his Lasix was held on the last admission and was restarted on 12/31 2019 comes into the hospital for intermittent chest tightness, which is usually worst at night and progressive shortness of breath to the point where he did not sleep last night, he went to the cancer center today to get his blood transfusion but they referred him to the ED as they can transfuse him at the cancer center today.    In the ED: His hemoglobin was checked and it was 6.8 he was given a unit of blood and developed pulmonary edema, so we were consulted for admission and evaluation.  Chest x-ray was done that showed pulmonary edema with right small pleural effusion   Review of Systems: All systems reviewed and apart from history of presenting illness, are negative.  Past Medical History:  Diagnosis Date  . Adenomatous colon polyp   . CAD (coronary artery disease)    30% LAD Stenosis, 70% ramus intermedius stenosis, treated with PTCA and angioplasty by Dr Albertine Patricia 2004  . Cataract    right eye  . CHF (congestive heart failure) ([place])   . Chronic ITP  (idiopathic thrombocytopenia) (HCC)   . Cough, persistent 11/04/2015  . Diverticulosis   . DM (diabetes mellitus) ([place])   . DVT (deep venous thrombosis) ([place])    secondary to surgery  . Dyspnea   . Fever 05/01/2018  . Hyperlipidemia   . Hypertension   . Inguinal hernia    right  . Laceration of finger of right hand 05/01/2018   INDEX FINGER  . 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 Microcytic anemia 07/07/2015  . 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 Non Hodgkin's lymphoma ([place])   . Pleural effusion, left 11/04/2015  . Prostate CA ([place]) 09/10/2011   Gleason 3+3 R, 3+4 L lobe May 2007 Rx Radioactive seed implants Dr [name]  . PVD (peripheral vascular disease) ([place])    rt renal artery stent  . Renal insufficiency   . Thrombocytopenia ([place])   . Thrombotic stroke ([place]) 09/10/2011   January 18, 2011 infarct genu & post limb R internal capsule - acute; previous lacunar infarcts/extensive white matter dis  . Thyroid nodule    left lower lobe (annual monitoring).  . Vitamin D deficiency    Past Surgical History:  Procedure Laterality Date  . APPENDECTOMY     patient ?  Marland Kitchen CARDIAC CATHETERIZATION    . CATARACT EXTRACTION    . CHEST TUBE INSERTION Left 02/10/2016   Procedure: INSERTION PLEURAL DRAINAGE CATHETER;  Surgeon: [name], MD;  Location: [facility];  Service: Thoracic;  Laterality: Left;  . CHEST  TUBE INSERTION Left 04/08/2016   Procedure: CHEST TUBE INSERTION;  Surgeon: [name], MD;  Location: [facility];  Service: Thoracic;  Laterality: Left;  . COLONOSCOPY    . CORONARY ANGIOPLASTY    . CYSTOURETHROSCOPY     ROBOTIC ARM NUCLETRON SEED IMPLANTATION OF PROSTATE  . EXPLORATORY LAPAROTOMY     For evaluation of lymphoma  . REMOVAL OF PLEURAL DRAINAGE CATHETER Left 04/08/2016   Procedure: REMOVAL OF PLEURAL DRAINAGE CATHETER;  Surgeon: [name], MD;  Location: [facility] OR;  Service: Thoracic;  Laterality: Left;   Social History:   reports that he quit smoking about 60 years ago. His smoking use included pipe and cigarettes. He has a 10.00 pack-year smoking history. He has never used smokeless tobacco. He reports that he does not drink alcohol or use drugs.   Allergies  Allergen Reactions  . Glucophage [Metformin Hydrochloride] Other (See Comments)    Chest pain  . Zetia [Ezetimibe] Other (See Comments)    weakness  . Fenofibrate Rash  . Niacin-Lovastatin Er Rash    Family History  Problem Relation Age of Onset  . Dementia Father   . Lymphoma Sister   . Prostate cancer Brother   . Breast cancer Sister   . Diabetes Brother   . Diabetes Sister   . Heart disease Brother   . Asthma Son      Prior to Admission medications   Medication Sig Start Date End Date Taking? Authorizing Provider  acetaminophen (TYLENOL) 500 MG tablet Take 1,000 mg by mouth every 6 (six) hours as needed for mild pain, moderate pain or headache.    Yes [provider]  aspirin 81 MG EC tablet Take 1 tablet (81 mg total) by mouth daily. 06/12/18 07/12/18 Yes Lavina Hamman, MD  clopidogrel (PLAVIX) 75 MG tablet TAKE 1 TABLET BY MOUTH ONCE A DAY WITH BREAKFAST Patient taking differently: Take 75 mg by mouth daily.  04/10/18  Yes [name], MD  feeding supplement, ENSURE ENLIVE, (ENSURE ENLIVE) LIQD Take 237 mLs by mouth 2 (two) times daily between meals. 06/12/18  Yes Lavina Hamman, MD  furosemide (LASIX) 20 MG tablet Take 20 mg by mouth daily.   Yes [provider]  gabapentin (NEURONTIN) 100 MG capsule TAKE 1 CAPSULE BY MOUTH AT BEDTIME 03/20/18  Yes [name], MD  Insulin Glargine (LANTUS SOLOSTAR) 100 UNIT/ML Solostar Pen INJECT 20 UNITS EVERY NIGHT AT BEDTIME 04/22/18  Yes Lady Deutscher, MD  loperamide (IMODIUM) 2 MG capsule Take 2 mg by mouth as needed for diarrhea or loose stools.   Yes [provider]  meclizine (ANTIVERT) 12.5 MG tablet Take 1 tablet (12.5 mg total) by mouth 3 (three)  times daily as needed for dizziness. 06/27/15  Yes [name], MD  metoprolol succinate (TOPROL-XL) 50 MG 24 hr tablet Take 1 tablet (50 mg total) by mouth daily. Take with or immediately following a meal. 06/12/18  Yes Lavina Hamman, MD  Multiple Vitamin (MULTIVITAMIN WITH MINERALS) TABS tablet Take 1 tablet by mouth every morning.    Yes [provider]  nitroGLYCERIN (NITROSTAT) 0.4 MG SL tablet Place 1 tablet (0.4 mg total) under the tongue every 5 (five) minutes as needed for chest pain. 05/03/18 05/03/19 Yes Amin, Jeanella Flattery, MD  NON FORMULARY Inject 1 Dose into the vein once a week. Platelet booster   Yes [provider]  OVER THE COUNTER MEDICATION Place 1 drop into both eyes daily as  needed (dry eyes). Over the counter lubricating eye drop   Yes [provider]  polyethylene glycol (MIRALAX / GLYCOLAX) packet Take 17 g by mouth daily. 06/12/18  Yes Lavina Hamman, MD  pravastatin (PRAVACHOL) 40 MG tablet TAKE 1 TABLET BY MOUTH ONCE A DAY AT [place]. Patient taking differently: Take 40 mg by mouth daily. At 6 pm 06/22/18  Yes [name], MD  tamsulosin (FLOMAX) 0.4 MG CAPS capsule TAKE 1 CAPSULE BY MOUTH ONCE DAILY 04/11/18  Yes [name], MD  traMADol (ULTRAM) 50 MG tablet Take 1 tablet (50 mg total) by mouth every 6 (six) hours as needed for moderate pain. 01/03/18  Yes [name], MD   Physical Exam: Vitals:   06/24/18 1445 06/24/18 1500 06/24/18 1515 06/24/18 1613  BP: 117/88 128/67 118/70 120/64  Pulse: (!) 116 (!) 120 (!) 124 (!) 126  Resp: (!) 29 (!) 25 (!) 33 (!) 32  Temp:   98.3 F (36.8 C)   TempSrc:   Oral   SpO2: 100% 97% 100% 98%  Weight:         General exam: Moderately built and nourished patient, comfortably in bed complaining of shortness of breath moaning groaning  Head, eyes and ENT: Nontraumatic and normocephalic.  Neck: Supple. No JVD, carotid bruit or thyromegaly.  Lymphatics: No  lymphadenopathy.  Respiratory system: He has good air movement with crackles at bases bilaterally.  Cardiovascular system: He is tachycardic with an S1-S2 has positive JVD to the earlobe.  Gastrointestinal system: Diminished soft nontender nondistended  Central nervous system: Alert and oriented. No focal neurological deficits.  Extremities: Symmetric 5 x 5 power. Peripheral pulses symmetrically felt.   Skin: No rashes or acute findings.  Musculoskeletal system: Negative exam.  Psychiatry: Pleasant and cooperative.   Labs on Admission:  Basic Metabolic Panel: Recent Labs  Lab 06/20/18 1542 06/24/18 1054  NA 137 137  K 4.8 4.6  CL 102 101  CO2 25  --   GLUCOSE 211* 234*  BUN 34* 36*  CREATININE 1.56* 1.80*  CALCIUM 8.5*  --    Liver Function Tests: No results for input(s): AST, ALT, ALKPHOS, BILITOT, PROT, ALBUMIN in the last 168 hours. No results for input(s): LIPASE, AMYLASE in the last 168 hours. No results for input(s): AMMONIA in the last 168 hours. CBC: Recent Labs  Lab 06/22/18 1357 06/24/18 1020 06/24/18 1054  WBC 3.1* 3.4*  --   NEUTROABS 1.5* 1.8  --   HGB 6.4* 6.1* 6.8*  HCT 21.3* 20.2* 20.0*  MCV 93.4 95.3  --   PLT 126* 110*  --    Cardiac Enzymes: No results for input(s): CKTOTAL, CKMB, CKMBINDEX, TROPONINI in the last 168 hours.  BNP (last 3 results) No results for input(s): PROBNP in the last 8760 hours. CBG: No results for input(s): GLUCAP in the last 168 hours.  Radiological Exams on Admission: Dg Chest Port 1 View  Result Date: 06/24/2018 CLINICAL DATA:  85 y/o M; chest tightness, shortness of breath, fatigue, anemia. EXAM: PORTABLE CHEST 1 VIEW COMPARISON:  06/05/2018 chest radiograph FINDINGS: Large right upper lung zone consolidation. Interstitial and alveolar pulmonary edema. Small left pleural effusion. Stable cardiac silhouette. Aortic calcific atherosclerosis. No acute osseous abnormality is evident. IMPRESSION: Stable large right  upper lung zone consolidation. Interstitial and alveolar pulmonary edema. Small left pleural effusion. Electronically Signed   By: Kristine Garbe M.D.   On: 06/24/2018 15:56    EKG: Independently reviewed.  Sinus  tach incomplete left bundle branch block and a left axis deviation.  Assessment/Plan Acute respiratory failure with Hypoxia due Acute on chronic combined systolic and diastolic CHF (congestive heart failure) ([place]) due to acute pulmonary edema: In the setting of blood transfusion and not taking his Lasix for several days. Start him on BiPAP overnight. His weight on admission is 79 kg. He is fluid overloaded on physical exam. Given high dose of Lasix IV 80 every 12 HRS. Monitor strict I's and O's Daily weights. Replete electrolytes and monitor as needed. His condition carries a high mortality and he has a very poor prognoses.  Myelodysplastic syndrome with 5 q. deletion leading to symptomatic microcytic anemia: He has been transfused 1 unit of packed red blood cells in the ED and went into pulmonary edema. We will start him on high-dose IV Lasix. We will transfuse another unit of packed red blood cells and check a CBC post transfusional.  Chronic THROMBOCYTOPENIA due to ITP: Platelets have remained stable ranging from 10 2-1 10 monitor intermittently.  Essential hypertension Continue metoprolol and Lasix. We will give him nitroglycerin sublingual to give time to the Lasix to work his blood pressure can tolerated.  Type 2 diabetes mellitus ([place]) Place him n.p.o. while he is on BiPAP. Put him on sliding scale CBGs every 4, decrease his long-acting insulin to 5 units daily.  CKD (chronic kidney disease), stage IV (HCC) Baseline creatinine ranging from 1.8-2. Renal function should tolerate this high dose of Lasix in the setting of volume overload.  Goals of care: The patient has a very poor prognosis I have talked to the daughter, the wife and patient seemed not to  be ready to move towards comfort care. Have explained to the daughter that it is only a matter of time when his heart was not able to tolerate these episodes of drop in hemoglobin and sudden onset of transfusions that lead to this clinical picture.  She relates that she agrees, she has agreed also to have palliative care come in and talk to the family again to try to develop a plan moving forward.   DVT Prophylaxis: lovenox Code Status: DNR  Family Communication: wife and daughter  Disposition Plan: home in several day   Time spent: 65 minutes    It is my clinical opinion that admission to INPATIENT is reasonable and necessary in this 85 y.o. male past medical history of chronic diastolic heart failure, myelodysplastic syndrome requiring multiple transfusions comes in with shortness of breath chest pain fatigue and and pulmonary edema, with physical exam showing JVD and crackles in his lungs, will have to admit to the hospital for high-dose IV Lasix and ongoing transfusions.   Given the aforementioned, the predictability of an adverse outcome is felt to be significant. I expect that the patient will require at least 2 midnights in the hospital to treat this condition.  Charlynne Cousins MD [facility] Pager [phone]    If 7PM-7AM, please contact night-coverage [email or web address] Password [email or web address]  06/24/2018, 4:40 PM

## 2018-06-24 NOTE — ED Notes (Signed)
ED TO INPATIENT HANDOFF REPORT  Name/Age/Gender Ricardo Watkins 85 y.o. male  Code Status    Code Status Orders  (From admission, onward)         Start     Ordered   06/24/18 1618  Do not attempt resuscitation/DNR  Continuous    Question Answer Comment  In the event of cardiac or respiratory ARREST Do not call a "code blue"   In the event of cardiac or respiratory ARREST Do not perform Intubation, CPR, defibrillation or ACLS   In the event of cardiac or respiratory ARREST Use medication by any route, position, wound care, and other measures to relive pain and suffering. May use oxygen, suction and manual treatment of airway obstruction as needed for comfort.      06/24/18 1617        Code Status History    Date Active Date Inactive Code Status Order ID Comments User Context   05/28/2018 1019 06/12/2018 2056 DNR [id number]  Lady Deutscher, MD ED   05/01/2018 1604 05/03/2018 2216 DNR [id number]  Karmen Bongo, MD ED   04/20/2018 1557 04/22/2018 1712 Full Code [id number]  Caren Griffins, MD ED   02/19/2018 1540 02/21/2018 1658 Full Code [id number]  Cristal Deer, MD Inpatient   06/02/2017 1728 06/04/2017 1652 Partial Code [id number]  Radene Gunning, NP ED   06/02/2017 1612 06/02/2017 1728 Full Code [id number]  Radene Gunning, NP ED   04/04/2016 0123 04/20/2016 1808 Full Code [id number]  Reubin Milan, MD ED   02/17/2016 2248 02/21/2016 1811 Full Code [id number]  Norval Morton, MD Inpatient    Advance Directive Documentation     Most Recent Value  Type of Advance Directive  Living will  Pre-existing out of facility DNR order (yellow form or pink MOST form)  -  "MOST" Form in Place?  -      Home/SNF/Other Home  Chief Complaint need blood   Level of Care/Admitting Diagnosis ED Disposition    ED Disposition Condition [place]: [facility] [100102]  Level of Care: Stepdown [14]  Admit to SDU based on following criteria:  Respiratory Distress:  Frequent assessment and/or intervention to maintain adequate ventilation/respiration, pulmonary toilet, and respiratory treatment.  Diagnosis: Acute pulmonary edema ([place]) [128786]  Admitting Physician: Charlynne Cousins [[id number]]  Attending Physician: Charlynne Cousins [[id number]]  Estimated length of stay: past midnight tomorrow  Certification:: I certify this patient will need inpatient services for at least 2 midnights  PT Class (Do Not Modify): Inpatient [101]  PT Acc Code (Do Not Modify): Private [1]       Medical History Past Medical History:  Diagnosis Date  . Adenomatous colon polyp   . CAD (coronary artery disease)    30% LAD Stenosis, 70% ramus intermedius stenosis, treated with PTCA and angioplasty by Dr Albertine Patricia 2004  . Cataract    right eye  . CHF (congestive heart failure) ([place])   . Chronic ITP (idiopathic thrombocytopenia) (HCC)   . Cough, persistent 11/04/2015  . Diverticulosis   . DM (diabetes mellitus) ([place])   . DVT (deep venous thrombosis) ([place])    secondary to surgery  . Dyspnea   . Fever 05/01/2018  . Hyperlipidemia   . Hypertension   . Inguinal hernia    right  . Laceration of finger of right hand 05/01/2018   INDEX FINGER  . 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 Microcytic anemia  07/07/2015  . 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 Non Hodgkin's lymphoma ([place])   . Pleural effusion, left 11/04/2015  . Prostate CA ([place]) 09/10/2011   Gleason 3+3 R, 3+4 L lobe May 2007 Rx Radioactive seed implants Dr [name]  . PVD (peripheral vascular disease) ([place])    rt renal artery stent  . Renal insufficiency   . Thrombocytopenia ([place])   . Thrombotic stroke ([place]) 09/10/2011   January 18, 2011 infarct genu & post limb R internal capsule - acute; previous lacunar infarcts/extensive white matter dis  . Thyroid nodule    left lower lobe (annual monitoring).  . Vitamin D deficiency     Allergies Allergies   Allergen Reactions  . Glucophage [Metformin Hydrochloride] Other (See Comments)    Chest pain  . Zetia [Ezetimibe] Other (See Comments)    weakness  . Fenofibrate Rash  . Niacin-Lovastatin Er Rash    IV Location/Drains/Wounds Patient Lines/Drains/Airways Status   Active Line/Drains/Airways    Name:   Placement date:   Placement time:   Site:   Days:   Peripheral IV 06/24/18 Right;Distal Forearm   06/24/18    1046    Forearm   less than 1   Peripheral IV 06/24/18 Right;Proximal Forearm   06/24/18    1047    Forearm   less than 1          Labs/Imaging Results for orders placed or performed during the hospital encounter of 06/24/18 (from the past 48 hour(s))  CBC with Differential/Platelet     Status: Abnormal   Collection Time: 06/24/18 10:20 AM  Result Value Ref Range   WBC 3.4 (L) 4.0 - 10.5 K/uL   RBC 2.12 (L) 4.22 - 5.81 MIL/uL   Hemoglobin 6.1 (LL) 13.0 - 17.0 g/dL    Comment: This critical result has verified and been called to DOWD,P by Newman Nickels on 01 04 2020 at 1208, and has been read back. CRITICAL VERIFIED   HCT 20.2 (L) 39.0 - 52.0 %   MCV 95.3 80.0 - 100.0 fL   MCH 28.8 26.0 - 34.0 pg   MCHC 30.2 30.0 - 36.0 g/dL   RDW 18.4 (H) 11.5 - 15.5 %   Platelets 110 (L) 150 - 400 K/uL    Comment: Immature Platelet Fraction may be clinically indicated, consider ordering this additional test [id number]    nRBC 0.0 0.0 - 0.2 %   Neutrophils Relative % 53 %   Neutro Abs 1.8 1.7 - 7.7 K/uL   Lymphocytes Relative 36 %   Lymphs Abs 1.2 0.7 - 4.0 K/uL   Monocytes Relative 9 %   Monocytes Absolute 0.3 0.1 - 1.0 K/uL   Eosinophils Relative 0 %   Eosinophils Absolute 0.0 0.0 - 0.5 K/uL   Basophils Relative 0 %   Basophils Absolute 0.0 0.0 - 0.1 K/uL   Immature Granulocytes 2 %   Abs Immature Granulocytes 0.06 0.00 - 0.07 K/uL   Polychromasia PRESENT     Comment: Performed at [facility], [place] [address], [place], [place] [address]  Type and screen      Status: None (Preliminary result)   Collection Time: 06/24/18 10:20 AM  Result Value Ref Range   ABO/RH(D) O POS    Antibody Screen NEG    Sample Expiration 06/27/2018    Unit Number [id number]    Blood Component Type RBC, LR IRR    Unit division 00    Status of  Unit ISSUED    Transfusion Status OK TO TRANSFUSE    Crossmatch Result Compatible    Unit Number [id number]    Blood Component Type RCLI PHER 1    Unit division 00    Status of Unit ALLOCATED    Transfusion Status OK TO TRANSFUSE    Crossmatch Result      Compatible Performed at [place] [address], [place], [place] [address]   ABO/Rh     Status: None (Preliminary result)   Collection Time: 06/24/18 10:20 AM  Result Value Ref Range   ABO/RH(D)      O POS Performed at [facility], [place] [address] [place], [place] [address]   Prepare RBC     Status: None   Collection Time: 06/24/18 10:21 AM  Result Value Ref Range   Order Confirmation      ORDER PROCESSED BY BLOOD BANK Performed at [facility], [place] [address] [place], [place] [address]   I-stat chem 8, ed     Status: Abnormal   Collection Time: 06/24/18 10:54 AM  Result Value Ref Range   Sodium 137 135 - 145 mmol/L   Potassium 4.6 3.5 - 5.1 mmol/L   Chloride 101 98 - 111 mmol/L   BUN 36 (H) 8 - 23 mg/dL   Creatinine, Ser 1.80 (H) 0.61 - 1.24 mg/dL   Glucose, Bld 234 (H) 70 - 99 mg/dL   Calcium, Ion 1.18 1.15 - 1.40 mmol/L   TCO2 26 22 - 32 mmol/L   Hemoglobin 6.8 (LL) 13.0 - 17.0 g/dL   HCT 20.0 (L) 39.0 - 52.0 %   Comment NOTIFIED PHYSICIAN   I-stat troponin, ED     Status: Abnormal   Collection Time: 06/24/18  4:57 PM  Result Value Ref Range   Troponin i, poc 0.46 (HH) 0.00 - 0.08 ng/mL   Comment NOTIFIED PHYSICIAN    Comment 3            Comment: Due to the release kinetics of cTnI, a negative result within the first hours of the onset of symptoms does not rule  out myocardial infarction with certainty. If myocardial infarction is still suspected, repeat the test at appropriate intervals.    *Note: Due to a large number of results and/or encounters for the requested time period, some results have not been displayed. A complete set of results can be found in Results Review.   Dg Chest Port 1 View  Result Date: 06/24/2018 CLINICAL DATA:  85 y/o M; chest tightness, shortness of breath, fatigue, anemia. EXAM: PORTABLE CHEST 1 VIEW COMPARISON:  06/05/2018 chest radiograph FINDINGS: Large right upper lung zone consolidation. Interstitial and alveolar pulmonary edema. Small left pleural effusion. Stable cardiac silhouette. Aortic calcific atherosclerosis. No acute osseous abnormality is evident. IMPRESSION: Stable large right upper lung zone consolidation. Interstitial and alveolar pulmonary edema. Small left pleural effusion. Electronically Signed   By: Kristine Garbe M.D.   On: 06/24/2018 15:56   EKG Interpretation  Date/Time:  Saturday June 24 2018 11:36:31 EST Ventricular Rate:  106 PR Interval:    QRS Duration: 119 QT Interval:  373 QTC Calculation: 496 R Axis:   -36 Text Interpretation:  Sinus tachycardia Incomplete left bundle branch block Anterior Q waves, possibly due to ILBBB No significant change since last tracing Confirmed by Blanchie Dessert [id number]) on 06/24/2018 11:42:44 AM   Pending Labs Unresulted Labs (From admission, onward)    Start     Ordered  Signed and Held  CBC  (heparin)  Once,   R    Comments:  Baseline for heparin therapy IF NOT ALREADY DRAWN.  Notify MD if PLT < 100 K.    Signed and Held   Signed and Held  Creatinine, serum  (heparin)  Once,   R    Comments:  Baseline for heparin therapy IF NOT ALREADY DRAWN.    Signed and Held   Signed and Held  CBC  Tomorrow morning,   R     Signed and Held   Signed and Held  Basic metabolic panel  Tomorrow morning,   R     Signed and Held           Vitals/Pain Today's Vitals   06/24/18 1744 06/24/18 1745 06/24/18 1800 06/24/18 1815  BP: 116/62 118/62 109/60 (!) 109/58  Pulse: (!) 120 (!) 120 (!) 118 (!) 118  Resp: (!) 26 (!) 29 (!) 31 (!) 33  Temp:      TempSrc:      SpO2: 99% 99% 99% 99%  Weight:      PainSc:        Isolation Precautions No active isolations  Medications Medications  nitroGLYCERIN (NITROSTAT) SL tablet 0.4 mg (has no administration in time range)  0.9 %  sodium chloride infusion (Manually program via Guardrails IV Fluids) ( Intravenous Stopped 06/24/18 1616)  furosemide (LASIX) injection 20 mg (20 mg Intravenous Given 06/24/18 1528)  acetaminophen (TYLENOL) tablet 650 mg (650 mg Oral Given 06/24/18 1614)  LORazepam (ATIVAN) injection 0.25 mg (0.25 mg Intravenous Given 06/24/18 1615)  furosemide (LASIX) injection 80 mg (80 mg Intravenous Given 06/24/18 1646)    Mobility walks

## 2018-06-24 NOTE — ED Provider Notes (Signed)
[facility] DEPT Provider Note   CSN: [id number] Arrival date & time: 06/24/18  5003     History   Chief Complaint Chief Complaint  Patient presents with  . Abnormal Lab    HPI Ricardo Watkins is a 85 y.o. male.  Patient is an 85 year old male with multiple medical problems including chronic ITP, non-Hodgkin's lymphoma, recurrent anemia thought to be a result from chemotherapy drugs, diabetes, CAD, CHF who is presenting today with request for a blood transfusion.  Patient is having to get frequent transfusions every few weeks for low hemoglobin.  He is getting booster shots for his hemoglobin and platelets weekly but was checked on Thursday and hemoglobin was 6.4.  Patient states he is just generally very fatigued and has no energy.  He will intermittently get chest tightness but is not having any currently.  He states it usually worse at night.  He does use chronic oxygen 2 L and he has not had any additional shortness of breath at rest but is more short of breath with exertion.  He has had no new leg swelling, fever, cough, abdominal pain, nausea or vomiting.  He denies any blood in his stool.  Patient went to the cancer center today because he was thinking he would be getting his plan transfusion but they told him it would not be until Monday.  Patient states he does not think he can wait till Monday.  The history is provided by the patient, the spouse and a relative.  Abnormal Lab  Time since result:  3 days Patient referred by:  Specialist Resulting agency:  Internal Result type: hematology   Hematology:    Hematocrit:  Low   Hemoglobin:  Low   Platelets:  Low   Past Medical History:  Diagnosis Date  . Adenomatous colon polyp   . CAD (coronary artery disease)    30% LAD Stenosis, 70% ramus intermedius stenosis, treated with PTCA and angioplasty by Dr Albertine Patricia 2004  . Cataract    right eye  . CHF (congestive heart failure) ([place])   . Chronic ITP  (idiopathic thrombocytopenia) (HCC)   . Cough, persistent 11/04/2015  . Diverticulosis   . DM (diabetes mellitus) ([place])   . DVT (deep venous thrombosis) ([place])    secondary to surgery  . Dyspnea   . Fever 05/01/2018  . Hyperlipidemia   . Hypertension   . Inguinal hernia    right  . Laceration of finger of right hand 05/01/2018   INDEX FINGER  . 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 Microcytic anemia 07/07/2015  . 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 Non Hodgkin's lymphoma ([place])   . Pleural effusion, left 11/04/2015  . Prostate CA ([place]) 09/10/2011   Gleason 3+3 R, 3+4 L lobe May 2007 Rx Radioactive seed implants Dr [name]  . PVD (peripheral vascular disease) ([place])    rt renal artery stent  . Renal insufficiency   . Thrombocytopenia ([place])   . Thrombotic stroke ([place]) 09/10/2011   January 18, 2011 infarct genu & post limb R internal capsule - acute; previous lacunar infarcts/extensive white matter dis  . Thyroid nodule    left lower lobe (annual monitoring).  . Vitamin D deficiency     Patient Active Problem List   Diagnosis Date Noted  . Lung mass   . Palliative care by specialist   . Community acquired pneumonia, bilateral   . CRF (chronic renal  failure), stage 3 (moderate) (HCC)   . Sepsis due to Enterococcus ([place])   . Bacteremia   . Palliative care encounter   . HCAP (healthcare-associated pneumonia) 05/28/2018  . Goals of care, counseling/discussion 05/28/2018  . FUO (fever of unknown origin) 05/01/2018  . Unstable angina ([place])   . Chronic systolic CHF (congestive heart failure) ([place])   . Acute on chronic combined systolic and diastolic CHF (congestive heart failure) ([place]) 04/22/2018  . Nonrheumatic aortic valve stenosis   . Chest pain in adult 04/20/2018  . Chronic neutropenia ([place]) 03/02/2018  . CHF (congestive heart failure) ([place]) 02/19/2018  . Upper airway cough syndrome 10/27/2017  . Abnormal CT of the chest 07/27/2017  .  Flu-like symptoms 06/02/2017  . Elevated lactic acid level 06/02/2017  . Acute kidney injury superimposed on chronic kidney disease ([place]) 06/02/2017  . CKD (chronic kidney disease), stage IV ([place]) 12/16/2016  . Bilateral carotid artery stenosis 12/11/2016  . Dyslipidemia 12/11/2016  . Anemia in chronic kidney disease 07/01/2016  . Chronic ITP (idiopathic thrombocytopenia) (HCC) 07/01/2016  . B12 deficiency anemia   . Chest tube in place   . Acute diastolic CHF (congestive heart failure) ([place]) 04/16/2016  . Acute respiratory failure with hypoxia ([place]) 04/14/2016  . Empyema lung ([place])   . Type 2 diabetes mellitus ([place]) 04/04/2016  . Empyema ([place]) 04/03/2016  . Thyroid nodule 03/03/2016  . Sepsis ([place]) 02/17/2016  . DOE (dyspnea on exertion) 01/27/2016  . Pulmonary edema with congestive heart failure ([place]) 11/12/2015  . Cough, persistent 11/04/2015  . Pleural effusion 11/04/2015  . Anorexia 08/08/2015  . Microcytic anemia 07/07/2015  . Pancytopenia ([place]) 04/08/2015  . Diarrhea 04/08/2015  . Encounter for chemotherapy management 04/02/2015  . Neutropenia ([place]) 02/18/2015  . Myelodysplasia (myelodysplastic syndrome) ([place]) 11/20/2014  . Deficiency anemia 04/19/2014  . Thrombocytopenia ([place]) 04/19/2014  . Vitamin B12 deficiency 04/19/2014  . Chronic renal failure, stage 3 (moderate) ([place]) 04/19/2014  . Macrocytic anemia 03/20/2013  . Monocytosis 03/20/2013  . Thrombotic stroke ([place]) 09/10/2011  . Prostate CA ([place]) 09/10/2011  . Inguinal hernia   . Colon polyps   . CAD (coronary artery disease) 08/26/2010  . Murmur 08/26/2010  . MURMUR 08/26/2010  . History of lymphoma 08/25/2010  . THROMBOCYTOPENIA 08/25/2010  . Essential hypertension 08/25/2010  . Coronary atherosclerosis 08/25/2010  . PVD 08/25/2010    Past Surgical History:  Procedure Laterality Date  . APPENDECTOMY     patient ?  Marland Kitchen CARDIAC CATHETERIZATION    . CATARACT EXTRACTION    . CHEST TUBE INSERTION Left  02/10/2016   Procedure: INSERTION PLEURAL DRAINAGE CATHETER;  Surgeon: [name], MD;  Location: [facility];  Service: Thoracic;  Laterality: Left;  . CHEST TUBE INSERTION Left 04/08/2016   Procedure: CHEST TUBE INSERTION;  Surgeon: [name], MD;  Location: [facility];  Service: Thoracic;  Laterality: Left;  . COLONOSCOPY    . CORONARY ANGIOPLASTY    . CYSTOURETHROSCOPY     ROBOTIC ARM NUCLETRON SEED IMPLANTATION OF PROSTATE  . EXPLORATORY LAPAROTOMY     For evaluation of lymphoma  . REMOVAL OF PLEURAL DRAINAGE CATHETER Left 04/08/2016   Procedure: REMOVAL OF PLEURAL DRAINAGE CATHETER;  Surgeon: [name], MD;  Location: [facility];  Service: Thoracic;  Laterality: Left;        Home Medications    Prior to Admission medications   Medication Sig Start Date End Date Taking? Authorizing Provider  acetaminophen (TYLENOL) 500 MG tablet Take 1,000 mg by  mouth every 6 (six) hours as needed for mild pain, moderate pain or headache.     [provider]  aspirin 81 MG EC tablet Take 1 tablet (81 mg total) by mouth daily. 06/12/18 07/12/18  Lavina Hamman, MD  clopidogrel (PLAVIX) 75 MG tablet TAKE 1 TABLET BY MOUTH ONCE A DAY WITH BREAKFAST 04/10/18   [name], MD  feeding supplement, ENSURE ENLIVE, (ENSURE ENLIVE) LIQD Take 237 mLs by mouth 2 (two) times daily between meals. 06/12/18   Lavina Hamman, MD  gabapentin (NEURONTIN) 100 MG capsule TAKE 1 CAPSULE BY MOUTH AT BEDTIME 03/20/18   [name], MD  Insulin Glargine (LANTUS SOLOSTAR) 100 UNIT/ML Solostar Pen INJECT 20 UNITS EVERY NIGHT AT BEDTIME 04/22/18   Lady Deutscher, MD  loperamide (IMODIUM) 2 MG capsule Take 2 mg by mouth as needed for diarrhea or loose stools.    [provider]  meclizine (ANTIVERT) 12.5 MG tablet Take 1 tablet (12.5 mg total) by mouth 3 (three) times daily as needed for dizziness. 06/27/15   [name], MD  metoprolol succinate (TOPROL-XL) 50 MG 24 hr tablet Take 1 tablet  (50 mg total) by mouth daily. Take with or immediately following a meal. 06/12/18   Lavina Hamman, MD  Multiple Vitamin (MULTIVITAMIN WITH MINERALS) TABS tablet Take 1 tablet by mouth every morning.     [provider]  neomycin-bacitracin-polymyxin (NEOSPORIN) ointment Apply 1 application topically as needed for wound care (finger).    [provider]  nitroGLYCERIN (NITROSTAT) 0.4 MG SL tablet Place 1 tablet (0.4 mg total) under the tongue every 5 (five) minutes as needed for chest pain. 05/03/18 05/03/19  Amin, Jeanella Flattery, MD  NON FORMULARY Inject 1 Dose into the vein once a week. Platelet booster    [provider]  OVER THE COUNTER MEDICATION Place 1 drop into both eyes daily as needed (dry eyes). Over the counter lubricating eye drop    [provider]  polyethylene glycol (MIRALAX / GLYCOLAX) packet Take 17 g by mouth daily. 06/12/18   Lavina Hamman, MD  pravastatin (PRAVACHOL) 40 MG tablet TAKE 1 TABLET BY MOUTH ONCE A DAY AT [facility] THE EVENING. 06/22/18   [name], MD  tamsulosin (FLOMAX) 0.4 MG CAPS capsule TAKE 1 CAPSULE BY MOUTH ONCE DAILY 04/11/18   [name], MD  traMADol (ULTRAM) 50 MG tablet Take 1 tablet (50 mg total) by mouth every 6 (six) hours as needed for moderate pain. 01/03/18   [name], MD    Family History Family History  Problem Relation Age of Onset  . Lymphoma Sister   . Prostate cancer Brother   . Breast cancer Sister   . Diabetes Brother   . Diabetes Sister   . Heart disease Brother   . Asthma Son     Social History Social History   Tobacco Use  . Smoking status: Former Smoker    Packs/day: 0.50    Years: 20.00    Pack years: 10.00    Types: Pipe, Cigarettes    Last attempt to quit: 06/21/1958    Years since quitting: 60.0  . Smokeless tobacco: Never Used  Substance Use Topics  . Alcohol use: No    Alcohol/week: 0.0 standard drinks  . Drug use: No     Allergies   Glucophage  [metformin hydrochloride]; Zetia [ezetimibe]; Fenofibrate; and Niacin-lovastatin er   Review of Systems Review of Systems  All other systems  reviewed and are negative.    Physical Exam Updated Vital Signs BP 116/61 (BP Location: Left Arm)   Pulse (!) 102   Temp 97.7 F (36.5 C) (Oral)   Resp (!) 24   Wt 79.9 kg   SpO2 100%   BMI 26.02 kg/m   Physical Exam Vitals signs and nursing note reviewed.  Constitutional:      General: He is not in acute distress.    Appearance: He is well-developed. He is ill-appearing.  HENT:     Head: Normocephalic and atraumatic.  Eyes:     Pupils: Pupils are equal, round, and reactive to light.     Comments: Pale conjunctiva  Neck:     Musculoskeletal: Normal range of motion and neck supple.  Cardiovascular:     Rate and Rhythm: Regular rhythm. Tachycardia present.     Heart sounds: Murmur present.  Pulmonary:     Effort: Pulmonary effort is normal. No respiratory distress.     Breath sounds: Normal breath sounds. No wheezing or rales.  Abdominal:     General: There is no distension.     Palpations: Abdomen is soft.     Tenderness: There is no abdominal tenderness. There is no guarding or rebound.  Musculoskeletal: Normal range of motion.        General: No tenderness.     Comments: Trace edema in bilateral lower extremities  Skin:    General: Skin is warm and dry.     Findings: No erythema or rash.  Neurological:     Mental Status: He is alert and oriented to person, place, and time.  Psychiatric:        Behavior: Behavior normal.      ED Treatments / Results  Labs (all labs ordered are listed, but only abnormal results are displayed) Labs Reviewed  CBC WITH DIFFERENTIAL/PLATELET - Abnormal; Notable for the following components:      Result Value   WBC 3.4 (*)    RBC 2.12 (*)    Hemoglobin 6.1 (*)    HCT 20.2 (*)    RDW 18.4 (*)    Platelets 110 (*)    All other components within normal limits  I-STAT CHEM 8, ED -  Abnormal; Notable for the following components:   BUN 36 (*)    Creatinine, Ser 1.80 (*)    Glucose, Bld 234 (*)    Hemoglobin 6.8 (*)    HCT 20.0 (*)    All other components within normal limits  I-STAT TROPONIN, ED  TYPE AND SCREEN  PREPARE RBC (CROSSMATCH)  ABO/RH    EKG EKG Interpretation  Date/Time:  Saturday June 24 2018 11:36:31 EST Ventricular Rate:  106 PR Interval:    QRS Duration: 119 QT Interval:  373 QTC Calculation: 496 R Axis:   -36 Text Interpretation:  Sinus tachycardia Incomplete left bundle branch block Anterior Q waves, possibly due to ILBBB No significant change since last tracing Confirmed by Blanchie Dessert [phone]) on 06/24/2018 11:42:44 AM   Radiology No results found.  Procedures Procedures (including critical care time)  Medications Ordered in ED Medications  0.9 %  sodium chloride infusion (Manually program via Guardrails IV Fluids) (has no administration in time range)  furosemide (LASIX) injection 20 mg (has no administration in time range)     Initial Impression / Assessment and Plan / ED Course  I have reviewed the triage vital signs and the nursing notes.  Pertinent labs & imaging results that were available during my  care of the patient were reviewed by me and considered in my medical decision making (see chart for details).     Patient presenting today requesting blood transfusion.  They suspect that his bone marrow is no longer producing hemoglobin.  Patient was checked by PCP on Thursday and hemoglobin was 6.4.  He thought he was supposed to be getting his plan transfusion today but they told him it would not be until Monday.  Patient did not know if he could wait until Monday.  He is generally fatigued and has no energy.  He will intermittently get chest pain but denies any pain currently.  No additional shortness of breath at rest and he is still on his home 2 L.  He denies any infectious symptoms.  Patient is requesting  transfusion and discharge home.  Patient last received blood 2 weeks ago.  Patient will be typed and screened.  CBC, Chem-8 and EKG pending.  He is tachycardic on exam but blood pressure is stable.  He did take his 20 mg of Lasix prior to arrival today but states he usually gets some IV as well.  Patient has trace edema in his lower legs but no overt signs of fluid overload.  3:36 PM Patient has received 1 full unit of blood but now he is complaining of feeling short of breath he has become tachycardic and he has some abdominal pain.  On exam now patient has some rales on the right side.  Patient was given Tylenol for pain but will she had a chest x-ray.  Oxygen saturation is still 97 to 100% on his 2 L however may need to do BiPAP.  Patient has received Lasix.  Will hold second unit at this time.  Patient's platelet count today is stable at 110.  Troponin and new EKG also pending.  3:47 PM Patient is stating he just does not feel like he can get a deep breath.  He does have signs of pulmonary edema on x-ray but no reaccumulation of his left-sided pleural effusion.  Will start patient on BiPAP.  Second unit was held.  Discussed with the family and he is a DNR/DNI.  Patient was given some mild anxiolysis his family states he is really struggled with anxiety lately after discussing the severity of his medical condition.  Patient's x-ray shows a large right upper lobe consolidation as well as pulmonary edema.  Consolidation is unchanged from when he was last hospitalized for right upper lobe pneumonia.  Patient is not having any further fevers and patient did receive 2 weeks of IV Unasyn and there was concern for mass versus pneumonia.  All repeat blood cultures were negative but because of recurrent fever patient was transitioned to IV meropenem which was given until December 23 and patient's thoracentesis was negative for malignant cytology.  CRITICAL CARE Performed by: Sailor Haughn Total critical care  time: 30 minutes Critical care time was exclusive of separately billable procedures and treating other patients. Critical care was necessary to treat or prevent imminent or life-threatening deterioration. Critical care was time spent personally by me on the following activities: development of treatment plan with patient and/or surrogate as well as nursing, discussions with consultants, evaluation of patient's response to treatment, examination of patient, obtaining history from patient or surrogate, ordering and performing treatments and interventions, ordering and review of laboratory studies, ordering and review of radiographic studies, pulse oximetry and re-evaluation of patient's condition.   Final Clinical Impressions(s) / ED Diagnoses   Final  diagnoses:  Symptomatic anemia  Acute pulmonary edema [facility])    ED Discharge Orders    None       Blanchie Dessert, MD 06/24/18 [phone]

## 2018-06-24 NOTE — Progress Notes (Signed)
Pt transported from ED to ICU 1232 on bipap 30% fio2.  Pt tolerated well without incident.  Pt remains on bipap.

## 2018-06-24 NOTE — ED Notes (Signed)
MD AND RN NOTIFIED OF PATIENT'S Hb LEVEL OF 6.8

## 2018-06-24 NOTE — ED Notes (Signed)
MD AND RN NOTIFIED OF PATIENT'S TROPONIN LEVEL OF 0.46

## 2018-06-24 NOTE — ED Notes (Signed)
Attempted report to ICU but not currently busy will call me back.

## 2018-06-24 NOTE — Progress Notes (Signed)
Holding BiPAP at this time per hospitalist. RT will continue to monitor patient.

## 2018-06-25 ENCOUNTER — Inpatient Hospital Stay (HOSPITAL_COMMUNITY): Payer: Medicare Other

## 2018-06-25 DIAGNOSIS — E1122 Type 2 diabetes mellitus with diabetic chronic kidney disease: Secondary | ICD-10-CM

## 2018-06-25 DIAGNOSIS — D693 Immune thrombocytopenic purpura: Secondary | ICD-10-CM

## 2018-06-25 DIAGNOSIS — D696 Thrombocytopenia, unspecified: Secondary | ICD-10-CM

## 2018-06-25 DIAGNOSIS — I10 Essential (primary) hypertension: Secondary | ICD-10-CM

## 2018-06-25 DIAGNOSIS — N183 Chronic kidney disease, stage 3 (moderate): Secondary | ICD-10-CM

## 2018-06-25 DIAGNOSIS — D649 Anemia, unspecified: Secondary | ICD-10-CM

## 2018-06-25 LAB — PREPARE RBC (CROSSMATCH)

## 2018-06-25 LAB — GLUCOSE, CAPILLARY
Glucose-Capillary: 114 mg/dL — ABNORMAL HIGH (ref 70–99)
Glucose-Capillary: 117 mg/dL — ABNORMAL HIGH (ref 70–99)
Glucose-Capillary: 166 mg/dL — ABNORMAL HIGH (ref 70–99)
Glucose-Capillary: 201 mg/dL — ABNORMAL HIGH (ref 70–99)
Glucose-Capillary: 214 mg/dL — ABNORMAL HIGH (ref 70–99)

## 2018-06-25 LAB — CBC
HCT: 25.1 % — ABNORMAL LOW (ref 39.0–52.0)
HCT: 27.2 % — ABNORMAL LOW (ref 39.0–52.0)
HEMOGLOBIN: 7.8 g/dL — AB (ref 13.0–17.0)
Hemoglobin: 8.6 g/dL — ABNORMAL LOW (ref 13.0–17.0)
MCH: 28.4 pg (ref 26.0–34.0)
MCH: 28.5 pg (ref 26.0–34.0)
MCHC: 31.1 g/dL (ref 30.0–36.0)
MCHC: 31.6 g/dL (ref 30.0–36.0)
MCV: 91.3 fL (ref 80.0–100.0)
MCV: 90.1 fL (ref 80.0–100.0)
NRBC: 0 % (ref 0.0–0.2)
Platelets: 119 10*3/uL — ABNORMAL LOW (ref 150–400)
Platelets: 110 10*3/uL — ABNORMAL LOW (ref 150–400)
RBC: 2.75 MIL/uL — ABNORMAL LOW (ref 4.22–5.81)
RBC: 3.02 MIL/uL — ABNORMAL LOW (ref 4.22–5.81)
RDW: 17.8 % — ABNORMAL HIGH (ref 11.5–15.5)
RDW: 18.2 % — ABNORMAL HIGH (ref 11.5–15.5)
WBC: 4.5 10*3/uL (ref 4.0–10.5)
WBC: 4.4 10*3/uL (ref 4.0–10.5)
nRBC: 0 % (ref 0.0–0.2)

## 2018-06-25 LAB — BASIC METABOLIC PANEL
Anion gap: 11 (ref 5–15)
BUN: 43 mg/dL — ABNORMAL HIGH (ref 8–23)
CHLORIDE: 101 mmol/L (ref 98–111)
CO2: 26 mmol/L (ref 22–32)
Calcium: 8.3 mg/dL — ABNORMAL LOW (ref 8.9–10.3)
Creatinine, Ser: 2.1 mg/dL — ABNORMAL HIGH (ref 0.61–1.24)
GFR calc Af Amer: 32 mL/min — ABNORMAL LOW (ref >60)
GFR calc non Af Amer: 28 mL/min — ABNORMAL LOW (ref >60)
Glucose, Bld: 123 mg/dL — ABNORMAL HIGH (ref 70–99)
Potassium: 4.4 mmol/L (ref 3.5–5.1)
Sodium: 138 mmol/L (ref 135–145)

## 2018-06-25 MED ORDER — ACETAMINOPHEN 325 MG PO TABS
650.0000 mg | ORAL_TABLET | Freq: Four times a day (QID) | ORAL | Status: DC | PRN
  Administered 2018-06-25 – 2018-06-26 (×3): 650 mg via ORAL
  Filled 2018-06-25 (×4): qty 2

## 2018-06-25 MED ORDER — INSULIN ASPART 100 UNIT/ML ~~LOC~~ SOLN
0.0000 [IU] | Freq: Every day | SUBCUTANEOUS | Status: DC
  Administered 2018-06-25: 2 [IU] via SUBCUTANEOUS
  Administered 2018-06-26: 3 [IU] via SUBCUTANEOUS

## 2018-06-25 MED ORDER — FUROSEMIDE 10 MG/ML IJ SOLN
80.0000 mg | Freq: Two times a day (BID) | INTRAMUSCULAR | Status: AC
  Administered 2018-06-25 (×2): 80 mg via INTRAVENOUS
  Filled 2018-06-25 (×2): qty 8

## 2018-06-25 MED ORDER — SODIUM CHLORIDE 0.9% IV SOLUTION: Freq: Once | INTRAVENOUS | Status: DC

## 2018-06-25 MED ORDER — INSULIN ASPART 100 UNIT/ML ~~LOC~~ SOLN
0.0000 [IU] | Freq: Three times a day (TID) | SUBCUTANEOUS | Status: DC
  Administered 2018-06-25: 3 [IU] via SUBCUTANEOUS
  Administered 2018-06-25: 2 [IU] via SUBCUTANEOUS
  Administered 2018-06-26: 3 [IU] via SUBCUTANEOUS
  Administered 2018-06-26: 2 [IU] via SUBCUTANEOUS
  Administered 2018-06-26: 3 [IU] via SUBCUTANEOUS
  Administered 2018-06-27: 2 [IU] via SUBCUTANEOUS
  Administered 2018-06-27: 3 [IU] via SUBCUTANEOUS
  Administered 2018-06-27: 2 [IU] via SUBCUTANEOUS
  Administered 2018-06-28: 3 [IU] via SUBCUTANEOUS
  Administered 2018-06-28: 2 [IU] via SUBCUTANEOUS

## 2018-06-25 MED ORDER — DILTIAZEM HCL 25 MG/5ML IV SOLN
10.0000 mg | Freq: Once | INTRAVENOUS | Status: AC
  Administered 2018-06-25: 10 mg via INTRAVENOUS
  Filled 2018-06-25: qty 5

## 2018-06-25 MED ORDER — METOPROLOL TARTRATE 5 MG/5ML IV SOLN
5.0000 mg | Freq: Four times a day (QID) | INTRAVENOUS | Status: DC | PRN
  Administered 2018-06-25 – 2018-06-27 (×5): 5 mg via INTRAVENOUS
  Filled 2018-06-25 (×5): qty 5

## 2018-06-25 NOTE — Progress Notes (Addendum)
[facility] PROGRESS NOTE    Progress Note  Ricardo Watkins  SUP:[id number] DOB: December 17, 1930 DOA: 06/24/2018 PCP: [name], MD     Brief Narrative:   Ricardo Watkins is an 85 y.o. male  male past medical history of chronic diastolic heart failure, significant peripheral vascular disease, remote history of prostate cancer, status post renal artery stenting, chronic ITP, on chronic home O2 CAD status post stenting,  deemed not an intervention candidate,, myelodysplastic syndrome with 5 q. deletion, will get frequent checks of her hemoglobin with his last one on Thursday where his hemoglobin was 6.4 who gets frequent transfusions every 2 weeks recently discharged from the hospital on 06/12/2018 for sepsis due to enterococcal bacteremia and right upper lobe pneumonia, his Lasix was held on the last admission and was restarted on 12/31 2019 comes into the hospital for intermittent chest tightness, which is usually worst at night and progressive shortness of breath to the point where he did not sleep last night  Assessment/Plan:   Acute respiratory failure with hypoxia due to acute pulmonary edema in the setting of acute on chronic combined diastolic heart failure: He was transfused 1 unit of packed red blood cells and begin progressively dyspneic. He was given high doses of IV Lasix and daughter BiPAP overnight.  His Lasix had been held at home for several days which is probably contributing to his volume overload. Continue IV Lasix monitor strict I's and O's he is negative about 1200. Monitor electrolytes and replete as needed.  Myelodysplastic syndrome with 5 q. deletion leading to symptomatic microcytic anemia: He is status post 2 units of packed red blood cells and his hemoglobin improved to 7.8. Continue to check hemoglobin intermittently. She is in the unit of packed red blood cells he has coronary artery disease we will try to keep hemoglobin above 8.  Chronic thrombocytopenia  due to ITP: His have remained stable.  Chronic kidney disease stage IV: His baseline creatinine is ranging from 1.8-2. On admission 1.7, continue to monitor.  Diabetes mellitus type 2: Continue long-acting insulin plus sliding scale.  Once he gets off the BiPAP can start him on a carb modified diet and change sliding scale.  Elevated cardiac biomarkers: In the setting of acute decompensated diastolic heart failure, he denies any chest pain, EKG showed no signs of ischemia.  Goals of care: Poor prognosis I have talked to the daughter yesterday about moving to comfort care. The wife is not ready we will reconsult hospice and palliative care. Awaiting recommendations.  DVT prophylaxis: lovenox Family Communication:none Disposition Plan/Barrier to D/C: keep in SDU Code Status:     Code Status Orders  (From admission, onward)         Start     Ordered   06/24/18 1956  Do not attempt resuscitation (DNR)  Continuous    Question Answer Comment  In the event of cardiac or respiratory ARREST Do not call a "code blue"   In the event of cardiac or respiratory ARREST Do not perform Intubation, CPR, defibrillation or ACLS   In the event of cardiac or respiratory ARREST Use medication by any route, position, wound care, and other measures to relive pain and suffering. May use oxygen, suction and manual treatment of airway obstruction as needed for comfort.      06/24/18 1956        Code Status History    Date Active Date Inactive Code Status Order ID Comments User Context   06/24/2018 [id number]  06/24/2018 1956 DNR [id number]  Dorie Rank, MD ED   05/28/2018 1019 06/12/2018 2056 DNR [id number]  Lady Deutscher, MD ED   05/01/2018 1604 05/03/2018 2216 DNR [id number]  Karmen Bongo, MD ED   04/20/2018 1557 04/22/2018 1712 Full Code [id number]  Caren Griffins, MD ED   02/19/2018 1540 02/21/2018 1658 Full Code [id number]  Cristal Deer, MD Inpatient   06/02/2017 1728 06/04/2017 1652 Partial Code  [id number]  Radene Gunning, NP ED   06/02/2017 1612 06/02/2017 1728 Full Code [id number]  Radene Gunning, NP ED   04/04/2016 0123 04/20/2016 1808 Full Code [id number]  Reubin Milan, MD ED   02/17/2016 2248 02/21/2016 1811 Full Code [id number]  Norval Morton, MD Inpatient    Advance Directive Documentation     Most Recent Value  Type of Advance Directive  Living will  Pre-existing out of facility DNR order (yellow form or pink MOST form)  -  "MOST" Form in Place?  -        IV Access:    Peripheral IV   Procedures and diagnostic studies:   Dg Chest Port 1 View  Result Date: 06/24/2018 CLINICAL DATA:  85 y/o M; chest tightness, shortness of breath, fatigue, anemia. EXAM: PORTABLE CHEST 1 VIEW COMPARISON:  06/05/2018 chest radiograph FINDINGS: Large right upper lung zone consolidation. Interstitial and alveolar pulmonary edema. Small left pleural effusion. Stable cardiac silhouette. Aortic calcific atherosclerosis. No acute osseous abnormality is evident. IMPRESSION: Stable large right upper lung zone consolidation. Interstitial and alveolar pulmonary edema. Small left pleural effusion. Electronically Signed   By: Kristine Garbe M.D.   On: 06/24/2018 15:56     Medical Consultants:    None.  Anti-Infectives:   None  Subjective:    Ricardo Watkins he tolerated BiPAP overnight.   Objective:    Vitals:   06/25/18 0400 06/25/18 0405 06/25/18 0500 06/25/18 0600  BP: (!) 129/50  (!) 126/55 (!) 108/52  Pulse: (!) 118  (!) 118 (!) 107  Resp: (!) 32  (!) 21 18  Temp:  97.8 F (36.6 C)    TempSrc:  Axillary    SpO2: 100%  99% 98%  Weight:        Intake/Output Summary (Last 24 hours) at 06/25/2018 0713 Last data filed at 06/25/2018 0120 Gross per 24 hour  Intake 707 ml  Output -  Net 707 ml   Filed Weights   06/24/18 1001 06/24/18 2002  Weight: 79.9 kg 79.2 kg    Exam: General exam: In no acute distress. Respiratory system: Good air movement with  crackles on the right side. Cardiovascular system: S1 & S2 heard, RRR.  Positive JVD Gastrointestinal system: Abdomen is nondistended, soft and nontender.  Central nervous system: Alert and oriented. No focal neurological deficits. Extremities: No lower extremity edema Skin: No rashes, lesions or ulcers   Data Reviewed:    Labs: Basic Metabolic Panel: Recent Labs  Lab 06/20/18 1542 06/24/18 1054 06/24/18 2144 06/25/18 0327  NA 137 137  --  138  K 4.8 4.6  --  4.4  CL 102 101  --  101  CO2 25  --   --  26  GLUCOSE 211* 234*  --  123*  BUN 34* 36*  --  43*  CREATININE 1.56* 1.80* 2.05* 2.10*  CALCIUM 8.5*  --   --  8.3*   GFR Estimated Creatinine Clearance: 25.7 mL/min (A) (by C-G formula based on SCr of 2.1 mg/dL (H)). Liver Function Tests:  No results for input(s): AST, ALT, ALKPHOS, BILITOT, PROT, ALBUMIN in the last 168 hours. No results for input(s): LIPASE, AMYLASE in the last 168 hours. No results for input(s): AMMONIA in the last 168 hours. Coagulation profile No results for input(s): INR, PROTIME in the last 168 hours.  CBC: Recent Labs  Lab 06/22/18 1357 06/24/18 1020 06/24/18 1054 06/24/18 2144 06/25/18 0327  WBC 3.1* 3.4*  --  4.9 4.5  NEUTROABS 1.5* 1.8  --   --   --   HGB 6.4* 6.1* 6.8* 7.4* 7.8*  HCT 21.3* 20.2* 20.0* 24.6* 25.1*  MCV 93.4 95.3  --  93.5 91.3  PLT 126* 110*  --  121* 119*   Cardiac Enzymes: No results for input(s): CKTOTAL, CKMB, CKMBINDEX, TROPONINI in the last 168 hours. BNP (last 3 results) No results for input(s): PROBNP in the last 8760 hours. CBG: Recent Labs  Lab 06/24/18 2018 06/24/18 2303 06/25/18 0334  GLUCAP 153* 134* 114*   D-Dimer: No results for input(s): DDIMER in the last 72 hours. Hgb A1c: No results for input(s): HGBA1C in the last 72 hours. Lipid Profile: No results for input(s): CHOL, HDL, LDLCALC, TRIG, CHOLHDL, LDLDIRECT in the last 72 hours. Thyroid function studies: No results for input(s):  TSH, T4TOTAL, T3FREE, THYROIDAB in the last 72 hours.  Invalid input(s): FREET3 Anemia work up: No results for input(s): VITAMINB12, FOLATE, FERRITIN, TIBC, IRON, RETICCTPCT in the last 72 hours. Sepsis Labs: Recent Labs  Lab 06/22/18 1357 06/24/18 1020 06/24/18 2144 06/25/18 0327  WBC 3.1* 3.4* 4.9 4.5   Microbiology Recent Results (from the past 240 hour(s))  MRSA PCR Screening     Status: None   Collection Time: 06/24/18  8:18 PM  Result Value Ref Range Status   MRSA by PCR NEGATIVE NEGATIVE Final    Comment:        The GeneXpert MRSA Assay (FDA approved for NASAL specimens only), is one component of a comprehensive MRSA colonization surveillance program. It is not intended to diagnose MRSA infection nor to guide or monitor treatment for MRSA infections. Performed at [facility], [place] [address] [place], [place] [address]      Medications:   . aspirin EC  81 mg Oral Daily  . clopidogrel  75 mg Oral Daily  . furosemide  80 mg Intravenous Q12H  . gabapentin  100 mg Oral QHS  . heparin  5,000 Units Subcutaneous Q8H  . insulin aspart  0-9 Units Subcutaneous Q4H  . insulin glargine  5 Units Subcutaneous QHS  . metoprolol succinate  50 mg Oral Daily  . nitroGLYCERIN  0.4 mg Sublingual Once  . polyethylene glycol  17 g Oral Daily  . pravastatin  40 mg Oral q1800  . sodium chloride flush  3 mL Intravenous Q12H  . tamsulosin  0.4 mg Oral Daily   Continuous Infusions: . sodium chloride        LOS: 1 day   Charlynne Cousins  [facility]   *Please refer to [email or web address], password [email or web address] to get updated schedule on who will round on this patient, as hospitalists switch teams weekly. If 7PM-7AM, please contact night-coverage at [email or web address], password [email or web address] for any overnight needs.  06/25/2018, 7:13 AM

## 2018-06-26 ENCOUNTER — Encounter (HOSPITAL_COMMUNITY): Payer: Self-pay

## 2018-06-26 ENCOUNTER — Other Ambulatory Visit: Payer: Self-pay

## 2018-06-26 ENCOUNTER — Inpatient Hospital Stay (HOSPITAL_COMMUNITY): Payer: Medicare Other

## 2018-06-26 ENCOUNTER — Ambulatory Visit: Payer: Medicare Other

## 2018-06-26 DIAGNOSIS — E440 Moderate protein-calorie malnutrition: Secondary | ICD-10-CM

## 2018-06-26 LAB — CBC
HCT: 27.6 % — ABNORMAL LOW (ref 39.0–52.0)
Hemoglobin: 8.6 g/dL — ABNORMAL LOW (ref 13.0–17.0)
MCH: 27.9 pg (ref 26.0–34.0)
MCHC: 31.2 g/dL (ref 30.0–36.0)
MCV: 89.6 fL (ref 80.0–100.0)
Platelets: 96 K/uL — ABNORMAL LOW (ref 150–400)
RBC: 3.08 MIL/uL — ABNORMAL LOW (ref 4.22–5.81)
RDW: 18.3 % — ABNORMAL HIGH (ref 11.5–15.5)
WBC: 4 10*3/uL (ref 4.0–10.5)
nRBC: 0 % (ref 0.0–0.2)

## 2018-06-26 LAB — STREP PNEUMONIAE URINARY ANTIGEN: Strep Pneumo Urinary Antigen: NEGATIVE

## 2018-06-26 LAB — TYPE AND SCREEN
ABO/RH(D): O POS
Antibody Screen: NEGATIVE
Unit division: 0
Unit division: 0
Unit division: 0

## 2018-06-26 LAB — GLUCOSE, CAPILLARY
Glucose-Capillary: 157 mg/dL — ABNORMAL HIGH (ref 70–99)
Glucose-Capillary: 235 mg/dL — ABNORMAL HIGH (ref 70–99)
Glucose-Capillary: 237 mg/dL — ABNORMAL HIGH (ref 70–99)
Glucose-Capillary: 251 mg/dL — ABNORMAL HIGH (ref 70–99)

## 2018-06-26 LAB — BPAM RBC
BLOOD PRODUCT EXPIRATION DATE: 202001292359
Blood Product Expiration Date: 202001282359
Blood Product Expiration Date: 202001292359
ISSUE DATE / TIME: 202001041226
ISSUE DATE / TIME: 202001050915
ISSUE DATE / TIME: 202001042212
UNIT TYPE AND RH: 5100
Unit Type and Rh: 5100
Unit Type and Rh: 5100

## 2018-06-26 LAB — COMPREHENSIVE METABOLIC PANEL
ALT: 18 U/L (ref 0–44)
AST: 14 U/L — ABNORMAL LOW (ref 15–41)
Albumin: 2.3 g/dL — ABNORMAL LOW (ref 3.5–5.0)
Alkaline Phosphatase: 68 U/L (ref 38–126)
Anion gap: 12 (ref 5–15)
BUN: 53 mg/dL — ABNORMAL HIGH (ref 8–23)
CO2: 26 mmol/L (ref 22–32)
Calcium: 8.2 mg/dL — ABNORMAL LOW (ref 8.9–10.3)
Chloride: 99 mmol/L (ref 98–111)
Creatinine, Ser: 2.18 mg/dL — ABNORMAL HIGH (ref 0.61–1.24)
GFR calc non Af Amer: 27 mL/min — ABNORMAL LOW (ref >60)
Glucose, Bld: 186 mg/dL — ABNORMAL HIGH (ref 70–99)
Potassium: 3.8 mmol/L (ref 3.5–5.1)
Sodium: 137 mmol/L (ref 135–145)
Total Bilirubin: 0.7 mg/dL (ref 0.3–1.2)
Total Protein: 7.8 g/dL (ref 6.5–8.1)

## 2018-06-26 LAB — MAGNESIUM: Magnesium: 1.9 mg/dL (ref 1.7–2.4)

## 2018-06-26 LAB — COMPREHENSIVE METABOLIC PANEL WITH GFR: GFR calc Af Amer: 31 mL/min — ABNORMAL LOW (ref >60)

## 2018-06-26 MED ORDER — VANCOMYCIN HCL 10 G IV SOLR: 1500.0000 mg | INTRAVENOUS | Status: DC

## 2018-06-26 MED ORDER — SODIUM CHLORIDE 0.9 % IV SOLN
1.0000 g | INTRAVENOUS | Status: DC
  Administered 2018-06-26 – 2018-06-27 (×2): 1 g via INTRAVENOUS
  Filled 2018-06-26 (×4): qty 1

## 2018-06-26 MED ORDER — METOPROLOL SUCCINATE ER 25 MG PO TB24
50.0000 mg | ORAL_TABLET | Freq: Once | ORAL | Status: AC
  Administered 2018-06-26: 50 mg via ORAL
  Filled 2018-06-26: qty 2

## 2018-06-26 MED ORDER — METOPROLOL SUCCINATE ER 100 MG PO TB24
100.0000 mg | ORAL_TABLET | Freq: Every day | ORAL | Status: DC
  Administered 2018-06-27 – 2018-06-28 (×2): 100 mg via ORAL
  Filled 2018-06-26: qty 1
  Filled 2018-06-26: qty 4

## 2018-06-26 MED ORDER — INSULIN GLARGINE 100 UNIT/ML ~~LOC~~ SOLN
10.0000 [IU] | Freq: Two times a day (BID) | SUBCUTANEOUS | Status: DC
  Administered 2018-06-26: 10 [IU] via SUBCUTANEOUS
  Filled 2018-06-26 (×2): qty 0.1

## 2018-06-26 MED ORDER — ENSURE ENLIVE PO LIQD
237.0000 mL | ORAL | Status: DC
  Administered 2018-06-26 – 2018-06-27 (×2): 237 mL via ORAL

## 2018-06-26 MED ORDER — FUROSEMIDE 10 MG/ML IJ SOLN
80.0000 mg | Freq: Two times a day (BID) | INTRAMUSCULAR | Status: AC
  Administered 2018-06-26 (×2): 80 mg via INTRAVENOUS
  Filled 2018-06-26 (×2): qty 8

## 2018-06-26 MED ORDER — VANCOMYCIN HCL 10 G IV SOLR
1750.0000 mg | INTRAVENOUS | Status: AC
  Administered 2018-06-26: 1750 mg via INTRAVENOUS
  Filled 2018-06-26: qty 1750

## 2018-06-26 MED ORDER — INSULIN GLARGINE 100 UNIT/ML ~~LOC~~ SOLN
5.0000 [IU] | Freq: Two times a day (BID) | SUBCUTANEOUS | Status: DC
  Administered 2018-06-26: 5 [IU] via SUBCUTANEOUS
  Filled 2018-06-26 (×2): qty 0.05

## 2018-06-26 MED ORDER — HYDROCOD POLST-CPM POLST ER 10-8 MG/5ML PO SUER
5.0000 mL | Freq: Two times a day (BID) | ORAL | Status: DC | PRN
  Administered 2018-06-26: 5 mL via ORAL
  Filled 2018-06-26: qty 5

## 2018-06-26 NOTE — Progress Notes (Signed)
Pharmacy Antibiotic Note  Ricardo Watkins is a 85 y.o. male with recent admission 05/28/2018-06/12/2018 for sepsis 2/2 Enterococcal bacteremia and RUL pneumonia, re-admitted on 06/24/2018 with intermittent chest tightness and progressive shortness of breath. Patient with new onset of cough and fever. MD checking chest x-ray and blood cultures and starting empiric antibiotics for healthcare associated pneumonia. Pharmacy has been consulted for Vancomycin dosing. Patient also placed on Cefepime per MD.   Plan:  Vancomycin 1750mg  IV x 1, then 1500mg  IV q48h.   Plan for Vancomycin levels at steady state, as indicated  Adjust Cefepime to 1g IV q24h for renal function  Monitor renal function, cultures, clinical course.   Weight: 174 lb 9.7 oz (79.2 kg)  Temp (24hrs), Avg:98.7 F (37.1 C), Min:97.7 F (36.5 C), Max:101 F (38.3 C)  Recent Labs  Lab 06/20/18 1542  06/24/18 1020 06/24/18 1054 06/24/18 2144 06/25/18 0327 06/25/18 1422 06/26/18 0337  WBC  --    < > 3.4*  --  4.9 4.5 4.4 4.0  CREATININE 1.56*  --   --  1.80* 2.05* 2.10*  --  2.18*   < > = values in this interval not displayed.    Estimated Creatinine Clearance: 24.8 mL/min (A) (by C-G formula based on SCr of 2.18 mg/dL (H)).    Allergies  Allergen Reactions  . Glucophage [Metformin Hydrochloride] Other (See Comments)    Chest pain  . Zetia [Ezetimibe] Other (See Comments)    weakness  . Fenofibrate Rash  . Niacin-Lovastatin Er Rash    Antimicrobials this admission: 1/6 Vancomycin >>  1/6 Cefepime >>   Dose adjustments this admission: --  Microbiology results: 1/4 MRSA PCR: negative 1/6 BCx: sent Sputum: ordered    Thank you for allowing pharmacy to be a part of this patient's care.   Lindell Spar, PharmD, BCPS Pager: [phone] 06/26/2018 4:33 PM

## 2018-06-26 NOTE — Consult Note (Signed)
   [facility] CM Inpatient Consult   06/26/2018  Ricardo Watkins September 08, 1930 [id number]    Patient screened for potential [facility] Care Management services due to unplanned readmission risk score of 50% (extreme) and multiple hospitalizations.   Chart reviewed. Noted palliative consult pending. Will engage for potential [facility] Care Management if appropriate.    Marthenia Rolling, MSN-Ed, RN,BSN [facility] Liaison [phone]

## 2018-06-26 NOTE — Progress Notes (Signed)
Initial Nutrition Assessment  DOCUMENTATION CODES:   Non-severe (moderate) malnutrition in context of chronic illness  INTERVENTION:  - Will order Ensure Enlive once/day, this supplement provides 350 kcal and 20 grams of protein. - Continue to encourage PO intakes.   NUTRITION DIAGNOSIS:   Moderate Malnutrition related to chronic illness as evidenced by mild fat depletion, mild muscle depletion.  GOAL:   Patient will meet greater than or equal to 90% of their needs  MONITOR:   PO intake, Supplement acceptance, Weight trends, Labs  REASON FOR ASSESSMENT:   Malnutrition Screening Tool  ASSESSMENT:   85 year old male with past medical history of CHF, significant PVD, remote history of prostate cancer, s/p renal artery stenting, chronic ITP, on chronic home O2, CAD s/p stenting, myelodysplastic syndrome and gets frequent transfusions (every 2 weeks). He presented to the ED d/t intermittent chest tightness, which is usually worse at night and progressive SOB.  BMI indicates overweight status, appropriate for age. Patient consumed 50% of breakfast and 20% of lunch and dinner yesterday. Family members at bedside. Patient was admitted at [facility] ~1 month ago. Prior to that, he had a good appetite/appetite was at baseline. Since the time of admission to [facility], appetite and desire to eat has been decreased. He does experience some increase in SOB with PO intakes, mainly with items that require more time chewing.   Per chart review, current weight is 175 lb and weight on 12/8 was 183 lb. This indicates 8 lb weight loss (4% body weight) in the past 1 month; not significant for time frame.   Medications reviewed; 80 mg IV lasix x2 doses today, sliding scale novolog, 5 units lantus/day, 1 packet miralax/day. Labs reviewed; CBGs: 157 and 235 mg/dL today, BUN: 53 mg/dl, creatinine: 2.18 mg/dl, Ca: 8.2 mg/dl, GFR: 27 ml/min.      NUTRITION - FOCUSED PHYSICAL EXAM:    Most Recent Value   Orbital Region  No depletion  Upper Arm Region  Mild depletion  Thoracic and Lumbar Region  Unable to assess  Buccal Region  Mild depletion  Temple Region  Mild depletion  Clavicle Bone Region  Mild depletion  Clavicle and Acromion Bone Region  Mild depletion  Scapular Bone Region  No depletion  Dorsal Hand  No depletion  Patellar Region  Unable to assess  Anterior Thigh Region  Unable to assess  Posterior Calf Region  Unable to assess  Edema (RD Assessment)  Unable to assess  Hair  Reviewed  Eyes  Reviewed  Mouth  Reviewed  Skin  Reviewed  Nails  Reviewed       Diet Order:   Diet Order            Diet heart healthy/carb modified Room service appropriate? Yes; Fluid consistency: Thin  Diet effective now              EDUCATION NEEDS:   No education needs have been identified at this time  Skin:  Skin Assessment: Reviewed RN Assessment  Last BM:  1/3 (PTA)  Height:   Ht Readings from Last 1 Encounters:  06/20/18 5\' 9"  (1.753 m)    Weight:   Wt Readings from Last 1 Encounters:  06/24/18 79.2 kg    Ideal Body Weight:  72.73 kg  BMI:  Body mass index is 25.78 kg/m.  Estimated Nutritional Needs:   Kcal:  1745-1980 kcal  Protein:  80-95 grams  Fluid:  >/= 1.8 L/day     Jarome Matin, MS, RD, LDN,  [facility] Inpatient Clinical Dietitian Pager # [phone] After hours/weekend pager # [phone]

## 2018-06-26 NOTE — Plan of Care (Signed)
  Problem: Nutrition: Goal: Adequate nutrition will be maintained Outcome: Progressing   Problem: Pain Managment: Goal: General experience of comfort will improve Outcome: Progressing   Problem: Safety: Goal: Ability to remain free from injury will improve Outcome: Progressing   

## 2018-06-26 NOTE — Progress Notes (Addendum)
[facility] PROGRESS NOTE    Progress Note  Ricardo Watkins  DUK:[id number] DOB: 1930-11-13 DOA: 06/24/2018 PCP: Ricardo Frizzle, MD     Brief Narrative:   Ricardo Watkins is an 85 y.o. male  male past medical history of chronic diastolic heart failure, significant peripheral vascular disease, remote history of prostate cancer, status post renal artery stenting, chronic ITP, on chronic home O2 CAD status post stenting,  deemed not an intervention candidate,, myelodysplastic syndrome with 5 q. deletion, will get frequent checks of her hemoglobin with his last one on Thursday where his hemoglobin was 6.4 who gets frequent transfusions every 2 weeks recently discharged from the hospital on 06/12/2018 for sepsis due to enterococcal bacteremia and right upper lobe pneumonia, his Lasix was held on the last admission and was restarted on 12/31 2019 comes into the hospital for intermittent chest tightness, which is usually worst at night and progressive shortness of breath to the point where he did not sleep last night  Assessment/Plan:   Acute respiratory failure with hypoxia due to acute pulmonary edema in the setting of acute on chronic combined diastolic heart failure: Likely due to holding of his Lasix several days and the unit of packed red blood cells just tipped him over. Now off BiPAP, continue IV Lasix, for 1 additional day then go back to his home dose. Daily weights strict I's and O's. Monitor electrolytes and replete as needed.  Myelodysplastic syndrome with 5 q. deletion leading to symptomatic microcytic anemia: He is status post 2 units of packed red blood cells and his hemoglobin improved to 7.8. Continue to check hemoglobin intermittently. He has coronary artery disease we will try to keep hemoglobin above 8.  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 Continue to monitor intermittently.  Chronic kidney disease stage IV: Cr.has remained at baseline  continue IV Lasix for 1 additional day.  Diabetes mellitus type 2: Continue long-acting insulin plus sliding scale.  Once he gets off the BiPAP can start him on a carb modified diet and change sliding scale.  Elevated cardiac biomarkers: In the setting of acute decompensated diastolic heart failure, he denies any chest pain, EKG showed no signs of ischemia.  Goals of care: Poor prognosis I have talked to the daughter yesterday about moving to comfort care. The wife is not ready we will reconsult hospice and palliative care. Awaiting recommendations.  New onset of cough with a fever: We will get a chest x-ray, blood cultures. We will start empirically on vancomycin and cefepime for healthcare associated pneumonia. We will give him Tussionex for cough.  DVT prophylaxis: lovenox Family Communication:none Disposition Plan/Barrier to D/C: transfer to telemetry Code Status:     Code Status Orders  (From admission, onward)         Start     Ordered   06/24/18 1956  Do not attempt resuscitation (DNR)  Continuous    Question Answer Comment  In the event of cardiac or respiratory ARREST Do not call a "code blue"   In the event of cardiac or respiratory ARREST Do not perform Intubation, CPR, defibrillation or ACLS   In the event of cardiac or respiratory ARREST Use medication by any route, position, wound care, and other measures to relive pain and suffering. May use oxygen, suction and manual treatment of airway obstruction as needed for comfort.      06/24/18 1956        Code Status History    Date Active  Date Inactive Code Status Order ID Comments User Context   06/24/2018 1617 06/24/2018 1956 DNR [id number]  Dorie Rank, MD ED   05/28/2018 1019 06/12/2018 2056 DNR [id number]  Lady Deutscher, MD ED   05/01/2018 1604 05/03/2018 2216 DNR [id number]  Karmen Bongo, MD ED   04/20/2018 1557 04/22/2018 1712 Full Code [id number]  Caren Griffins, MD ED   02/19/2018 1540 02/21/2018 1658 Full  Code [id number]  Cristal Deer, MD Inpatient   06/02/2017 1728 06/04/2017 1652 Partial Code [id number]  Radene Gunning, NP ED   06/02/2017 1612 06/02/2017 1728 Full Code [id number]  Radene Gunning, NP ED   04/04/2016 0123 04/20/2016 1808 Full Code [id number]  Reubin Milan, MD ED   02/17/2016 2248 02/21/2016 1811 Full Code [id number]  Norval Morton, MD Inpatient    Advance Directive Documentation     Most Recent Value  Type of Advance Directive  Living will  Pre-existing out of facility DNR order (yellow form or pink MOST form)  -  "MOST" Form in Place?  -        IV Access:    Peripheral IV   Procedures and diagnostic studies:   Dg Chest Port 1 View  Result Date: 06/24/2018 CLINICAL DATA:  85 y/o M; chest tightness, shortness of breath, fatigue, anemia. EXAM: PORTABLE CHEST 1 VIEW COMPARISON:  06/05/2018 chest radiograph FINDINGS: Large right upper lung zone consolidation. Interstitial and alveolar pulmonary edema. Small left pleural effusion. Stable cardiac silhouette. Aortic calcific atherosclerosis. No acute osseous abnormality is evident. IMPRESSION: Stable large right upper lung zone consolidation. Interstitial and alveolar pulmonary edema. Small left pleural effusion. Electronically Signed   By: Kristine Garbe M.D.   On: 06/24/2018 15:56     Medical Consultants:    None.  Anti-Infectives:   None  Subjective:    Ricardo Watkins he relates no complaints he relates his shortness of breath is at baseline did not use BiPAP overnight slept well overnight tolerating his diet.   Objective:    Vitals:   06/26/18 0000 06/26/18 0052 06/26/18 0358 06/26/18 0400  BP: 116/67   133/74  Pulse: (!) 113 (!) 116  96  Resp: (!) 30 (!) 23  (!) 22  Temp:   97.7 F (36.5 C)   TempSrc:   Axillary   SpO2: 95% 96%  97%  Weight:        Intake/Output Summary (Last 24 hours) at 06/26/2018 0719 Last data filed at 06/26/2018 0650 Gross per 24 hour  Intake 950 ml  Output  2600 ml  Net -1650 ml   Filed Weights   06/24/18 1001 06/24/18 2002  Weight: 79.9 kg 79.2 kg    Exam: General exam: In no acute distress. Respiratory system: Good air movement with crackles on the right side. Cardiovascular system: S1 & S2 heard, RRR.  Positive hepatojugular reflux Gastrointestinal system: Abdomen is nondistended, soft and nontender.  Central nervous system: Alert and oriented. No focal neurological deficits. Extremities: Trace edema. Skin: No rashes, lesions or ulcers   Data Reviewed:    Labs: Basic Metabolic Panel: Recent Labs  Lab 06/20/18 1542 06/24/18 1054 06/24/18 2144 06/25/18 0327 06/26/18 0337  NA 137 137  --  138 137  K 4.8 4.6  --  4.4 3.8  CL 102 101  --  101 99  CO2 25  --   --  26 26  GLUCOSE 211* 234*  --  123* 186*  BUN 34* 36*  --  43* 53*  CREATININE 1.56* 1.80* 2.05* 2.10* 2.18*  CALCIUM 8.5*  --   --  8.3* 8.2*  MG  --   --   --   --  1.9   GFR Estimated Creatinine Clearance: 24.8 mL/min (A) (by C-G formula based on SCr of 2.18 mg/dL (H)). Liver Function Tests: Recent Labs  Lab 06/26/18 0337  AST 14*  ALT 18  ALKPHOS 68  BILITOT 0.7  PROT 7.8  ALBUMIN 2.3*   No results for input(s): LIPASE, AMYLASE in the last 168 hours. No results for input(s): AMMONIA in the last 168 hours. Coagulation profile No results for input(s): INR, PROTIME in the last 168 hours.  CBC: Recent Labs  Lab 06/22/18 1357 06/24/18 1020 06/24/18 1054 06/24/18 2144 06/25/18 0327 06/25/18 1422 06/26/18 0337  WBC 3.1* 3.4*  --  4.9 4.5 4.4 4.0  NEUTROABS 1.5* 1.8  --   --   --   --   --   HGB 6.4* 6.1* 6.8* 7.4* 7.8* 8.6* 8.6*  HCT 21.3* 20.2* 20.0* 24.6* 25.1* 27.2* 27.6*  MCV 93.4 95.3  --  93.5 91.3 90.1 89.6  PLT 126* 110*  --  121* 119* 110* 96*   Cardiac Enzymes: No results for input(s): CKTOTAL, CKMB, CKMBINDEX, TROPONINI in the last 168 hours. BNP (last 3 results) No results for input(s): PROBNP in the last 8760  hours. CBG: Recent Labs  Lab 06/25/18 0334 06/25/18 0733 06/25/18 1138 06/25/18 1638 06/25/18 2007  GLUCAP 114* 117* 166* 201* 214*   D-Dimer: No results for input(s): DDIMER in the last 72 hours. Hgb A1c: No results for input(s): HGBA1C in the last 72 hours. Lipid Profile: No results for input(s): CHOL, HDL, LDLCALC, TRIG, CHOLHDL, LDLDIRECT in the last 72 hours. Thyroid function studies: No results for input(s): TSH, T4TOTAL, T3FREE, THYROIDAB in the last 72 hours.  Invalid input(s): FREET3 Anemia work up: No results for input(s): VITAMINB12, FOLATE, FERRITIN, TIBC, IRON, RETICCTPCT in the last 72 hours. Sepsis Labs: Recent Labs  Lab 06/24/18 2144 06/25/18 0327 06/25/18 1422 06/26/18 0337  WBC 4.9 4.5 4.4 4.0   Microbiology Recent Results (from the past 240 hour(s))  MRSA PCR Screening     Status: None   Collection Time: 06/24/18  8:18 PM  Result Value Ref Range Status   MRSA by PCR NEGATIVE NEGATIVE Final    Comment:        The GeneXpert MRSA Assay (FDA approved for NASAL specimens only), is one component of a comprehensive MRSA colonization surveillance program. It is not intended to diagnose MRSA infection nor to guide or monitor treatment for MRSA infections. Performed at [facility], [place] [address] [place], [place] [address]      Medications:   . sodium chloride   Intravenous Once  . aspirin EC  81 mg Oral Daily  . clopidogrel  75 mg Oral Daily  . furosemide  80 mg Intravenous Q12H  . gabapentin  100 mg Oral QHS  . heparin  5,000 Units Subcutaneous Q8H  . insulin aspart  0-5 Units Subcutaneous QHS  . insulin aspart  0-9 Units Subcutaneous TID WC  . insulin glargine  5 Units Subcutaneous QHS  . metoprolol succinate  50 mg Oral Daily  . nitroGLYCERIN  0.4 mg Sublingual Once  . polyethylene glycol  17 g Oral Daily  . pravastatin  40 mg Oral q1800  . sodium chloride flush  3 mL Intravenous Q12H  . tamsulosin  0.4 mg Oral  Daily  Continuous Infusions: . sodium chloride        LOS: 2 days   Charlynne Cousins  [facility]   *Please refer to [email or web address], password [email or web address] to get updated schedule on who will round on this patient, as hospitalists switch teams weekly. If 7PM-7AM, please contact night-coverage at [email or web address], password [email or web address] for any overnight needs.  06/26/2018, 7:19 AM

## 2018-06-26 NOTE — Care Management Note (Signed)
Case Management Note  Patient Details  Name: Ricardo Watkins MRN: [id number] Date of Birth: August 25, 1930  Subjective/Objective:                  Acute respiratory failure with hypoxia due to acute pulmonary edema in the setting of acute on chronic combined diastolic heart failure: Anemia Return to top of Anemia, Iron Deficiency or Unspecified RRG - ISC  Discharge readiness is indicated by patient meeting Recovery Milestones, including ALL of the following: ? Hemodynamic stability ? Temp=99.6/heart rate=127/bp-76/46/o2 via [place] ? 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 Active blood loss absent hgb 8.6 to 7.9-rec'ing one unit of prbc's-today [id number] ? Signs and symptoms of anemia absent or improved no see above note ? Hgb/Hct level stable and acceptable for next level of care no ? Mental status at baseline no ? Ambulatory[L] no Goals of care: Poor prognosis I have talked to the daughter yesterday about moving to comfort care. The wife is not ready we will reconsult hospice and palliative care. Awaiting recommendations.  Action/Plan: Transferring to telemetry floor Will follow for progression of care and clinical status. Will follow for case management needs none present at this time.  Expected Discharge Date:  06/28/18               Expected Discharge Plan:     In-House Referral:     Discharge planning Services     Post Acute Care Choice:    Choice offered to:     DME Arranged:    DME Agency:     HH Arranged:    HH Agency:     Status of Service:     If discussed at [employer], dates discussed:    Additional Comments:  Leeroy Cha, RN 06/26/2018, 8:50 AM

## 2018-06-27 ENCOUNTER — Inpatient Hospital Stay (HOSPITAL_COMMUNITY): Admit: 2018-06-27 | Payer: Medicare Other

## 2018-06-27 ENCOUNTER — Ambulatory Visit: Payer: Medicare Other | Admitting: Family Medicine

## 2018-06-27 DIAGNOSIS — I5043 Acute on chronic combined systolic (congestive) and diastolic (congestive) heart failure: Secondary | ICD-10-CM

## 2018-06-27 DIAGNOSIS — N184 Chronic kidney disease, stage 4 (severe): Secondary | ICD-10-CM

## 2018-06-27 DIAGNOSIS — F411 Generalized anxiety disorder: Secondary | ICD-10-CM

## 2018-06-27 DIAGNOSIS — D469 Myelodysplastic syndrome, unspecified: Secondary | ICD-10-CM

## 2018-06-27 DIAGNOSIS — J810 Acute pulmonary edema: Secondary | ICD-10-CM

## 2018-06-27 LAB — BASIC METABOLIC PANEL
Anion gap: 11 (ref 5–15)
BUN: 59 mg/dL — ABNORMAL HIGH (ref 8–23)
CO2: 30 mmol/L (ref 22–32)
Calcium: 8.2 mg/dL — ABNORMAL LOW (ref 8.9–10.3)
Chloride: 96 mmol/L — ABNORMAL LOW (ref 98–111)
Creatinine, Ser: 2.2 mg/dL — ABNORMAL HIGH (ref 0.61–1.24)
GFR calc Af Amer: 31 mL/min — ABNORMAL LOW (ref >60)
GFR calc non Af Amer: 26 mL/min — ABNORMAL LOW (ref >60)
GLUCOSE: 155 mg/dL — AB (ref 70–99)
Potassium: 4.4 mmol/L (ref 3.5–5.1)
Sodium: 137 mmol/L (ref 135–145)

## 2018-06-27 LAB — GLUCOSE, CAPILLARY
GLUCOSE-CAPILLARY: 233 mg/dL — AB (ref 70–99)
Glucose-Capillary: 151 mg/dL — ABNORMAL HIGH (ref 70–99)
Glucose-Capillary: 153 mg/dL — ABNORMAL HIGH (ref 70–99)
Glucose-Capillary: 188 mg/dL — ABNORMAL HIGH (ref 70–99)
Glucose-Capillary: 198 mg/dL — ABNORMAL HIGH (ref 70–99)

## 2018-06-27 LAB — CREATININE, SERUM
Creatinine, Ser: 2.17 mg/dL — ABNORMAL HIGH (ref 0.61–1.24)
GFR calc Af Amer: 31 mL/min — ABNORMAL LOW (ref >60)
GFR calc non Af Amer: 27 mL/min — ABNORMAL LOW (ref >60)

## 2018-06-27 LAB — HIV ANTIBODY (ROUTINE TESTING W REFLEX): HIV Screen 4th Generation wRfx: NONREACTIVE

## 2018-06-27 MED ORDER — TRAMADOL HCL 50 MG PO TABS: 50.0000 mg | ORAL_TABLET | Freq: Four times a day (QID) | ORAL | Status: DC | PRN

## 2018-06-27 MED ORDER — ORAL CARE MOUTH RINSE: 15.0000 mL | Freq: Two times a day (BID) | OROMUCOSAL | Status: DC

## 2018-06-27 MED ORDER — ENSURE ENLIVE PO LIQD
237.0000 mL | Freq: Two times a day (BID) | ORAL | Status: DC
  Administered 2018-06-28 (×2): 237 mL via ORAL

## 2018-06-27 MED ORDER — ALPRAZOLAM 0.25 MG PO TABS
0.2500 mg | ORAL_TABLET | Freq: Every day | ORAL | Status: DC
  Filled 2018-06-27: qty 1

## 2018-06-27 MED ORDER — INSULIN GLARGINE 100 UNIT/ML ~~LOC~~ SOLN
15.0000 [IU] | Freq: Two times a day (BID) | SUBCUTANEOUS | Status: DC
  Administered 2018-06-27 – 2018-06-28 (×3): 15 [IU] via SUBCUTANEOUS
  Filled 2018-06-27 (×4): qty 0.15

## 2018-06-27 MED ORDER — ALPRAZOLAM 0.25 MG PO TABS
0.2500 mg | ORAL_TABLET | Freq: Two times a day (BID) | ORAL | Status: DC | PRN
  Administered 2018-06-27: 0.25 mg via ORAL
  Filled 2018-06-27: qty 1

## 2018-06-27 MED ORDER — FUROSEMIDE 20 MG PO TABS
20.0000 mg | ORAL_TABLET | Freq: Every day | ORAL | Status: DC
  Administered 2018-06-27 – 2018-06-28 (×2): 20 mg via ORAL
  Filled 2018-06-27 (×2): qty 1

## 2018-06-27 MED ORDER — FUROSEMIDE 10 MG/ML IJ SOLN
80.0000 mg | Freq: Two times a day (BID) | INTRAMUSCULAR | Status: AC
  Administered 2018-06-27 (×2): 80 mg via INTRAVENOUS
  Filled 2018-06-27 (×2): qty 8

## 2018-06-27 NOTE — Progress Notes (Signed)
Patient asleep, cool to touch, noted diaphoresis to pt's forehead. Woke patient, he aroused easily and was oriented. Able to follow commands and answer questions appropriately. Denied pain and/or shortness of breath. NT in to check blood sugar, 153.

## 2018-06-27 NOTE — Progress Notes (Signed)
[facility] PROGRESS NOTE    Progress Note  Ricardo Watkins  LOV:[id number] DOB: [birth year] DOA: 06/24/2018 PCP: [name], MD     Brief Narrative:   Ricardo Watkins is an 85 y.o. male  male past medical history of chronic diastolic heart failure, significant peripheral vascular disease, remote history of prostate cancer, status post renal artery stenting, chronic ITP, on chronic home O2 CAD status post stenting,  deemed not an intervention candidate,, myelodysplastic syndrome with 5 q. deletion, will get frequent checks of her hemoglobin with his last one on Thursday where his hemoglobin was 6.4 who gets frequent transfusions every 2 weeks recently discharged from the hospital on 06/12/2018 for sepsis due to enterococcal bacteremia and right upper lobe pneumonia, his Lasix was held on the last admission and was restarted on 12/31 2019 comes into the hospital for intermittent chest tightness, which is usually worst at night and progressive shortness of breath to the point where he did not sleep last night  Assessment/Plan:   Acute respiratory failure with hypoxia due to acute pulmonary edema in the setting of acute on chronic combined diastolic heart failure: Likely due to holding of his Lasix several days and the unit of packed red blood cells just tipped him over. Now off BiPAP, continue IV Lasix for 1 additional day then go back to his home dose. Continue daily weights strict I's and O's. Basic metabolic panel is pending this morning. Monitor electrolytes and replete as needed.  Myelodysplastic syndrome with 5 q. deletion leading to symptomatic microcytic anemia: He is status post 2 units of packed red blood cells hemoglobin is 8.6. Has a history of coronary artery disease we will try to keep hemoglobin above 8.  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 Continue to monitor intermittently.  Chronic kidney disease stage IV: Cr.has remained at baseline  continue IV Lasix for 1 additional day.  Diabetes mellitus type 2: Blood glucose running high increase long-acting insulin continue sliding scale.  Elevated cardiac biomarkers: In the setting of acute decompensated diastolic heart failure, he denies any chest pain, EKG showed no signs of ischemia.  Goals of care: Poor prognosis I have talked to the daughter yesterday about moving to comfort care. The wife is not ready we will reconsult hospice and palliative care. Awaiting recommendations.  New onset of cough with a fever: X-ray shows no acute new infiltrates, he continues to have a cough. Blood cultures are pending. We will continue empiric IV vancomycin and cefepime.  DVT prophylaxis: lovenox Family Communication:none Disposition Plan/Barrier to D/C: transfer to telemetry Code Status:     Code Status Orders  (From admission, onward)         Start     Ordered   06/24/18 1956  Do not attempt resuscitation (DNR)  Continuous    Question Answer Comment  In the event of cardiac or respiratory ARREST Do not call a "code blue"   In the event of cardiac or respiratory ARREST Do not perform Intubation, CPR, defibrillation or ACLS   In the event of cardiac or respiratory ARREST Use medication by any route, position, wound care, and other measures to relive pain and suffering. May use oxygen, suction and manual treatment of airway obstruction as needed for comfort.      06/24/18 1956        Code Status History    Date Active Date Inactive Code Status Order ID Comments User Context   06/24/2018 1617 06/24/2018 1956 DNR  [id number]  Dorie Rank, MD ED   05/28/2018 1019 06/12/2018 2056 DNR [id number]  Lady Deutscher, MD ED   05/01/2018 1604 05/03/2018 2216 DNR [id number]  Karmen Bongo, MD ED   04/20/2018 1557 04/22/2018 1712 Full Code [id number]  Caren Griffins, MD ED   02/19/2018 1540 02/21/2018 1658 Full Code [id number]  Cristal Deer, MD Inpatient   06/02/2017 1728 06/04/2017 1652  Partial Code [id number]  Radene Gunning, NP ED   06/02/2017 1612 06/02/2017 1728 Full Code [id number]  Radene Gunning, NP ED   04/04/2016 0123 04/20/2016 1808 Full Code [id number]  Reubin Milan, MD ED   02/17/2016 2248 02/21/2016 1811 Full Code [id number]  Norval Morton, MD Inpatient    Advance Directive Documentation     Most Recent Value  Type of Advance Directive  Living will  Pre-existing out of facility DNR order (yellow form or pink MOST form)  -  "MOST" Form in Place?  -        IV Access:    Peripheral IV   Procedures and diagnostic studies:   Dg Chest Port 1 View  Result Date: 06/26/2018 CLINICAL DATA:  Fever EXAM: PORTABLE CHEST 1 VIEW COMPARISON:  06/24/2018 FINDINGS: Unchanged right upper lobe consolidation. No sizable pleural effusion or pneumothorax. Heart size is normal. Pulmonary edema has resolved. IMPRESSION: Unchanged appearance of right upper lobe pneumonia. Resolution of pulmonary edema. Electronically Signed   By: Ulyses Jarred M.D.   On: 06/26/2018 17:18     Medical Consultants:    None.  Anti-Infectives:   None  Subjective:    [name] relates his breathing is better he is tolerating his diet well, continues to have a persistent cough.   Objective:    Vitals:   06/27/18 0300 06/27/18 0400 06/27/18 0500 06/27/18 0549  BP:      Pulse: (!) 113 (!) 114 (!) 55   Resp: (!) 25 (!) 25 (!) 23   Temp:  100 F (37.8 C)    TempSrc:  Axillary    SpO2: 96% 96% 96%   Weight:    76.2 kg  Height:    5\' 9"  (1.753 m)    Intake/Output Summary (Last 24 hours) at 06/27/2018 0726 Last data filed at 06/27/2018 0000 Gross per 24 hour  Intake 600.1 ml  Output 400 ml  Net 200.1 ml   Filed Weights   06/24/18 1001 06/24/18 2002 06/27/18 0549  Weight: 79.9 kg 79.2 kg 76.2 kg    Exam: General exam: In no acute distress. Respiratory system: Good air movement with crackles on the right side. Cardiovascular system: S1 & S2 heard, RRR.  Positive  hepatojugular reflux Gastrointestinal system: Abdomen is nondistended, soft and nontender.  Central nervous system: Alert and oriented. No focal neurological deficits. Extremities: Trace edema. Skin: No rashes, lesions or ulcers   Data Reviewed:    Labs: Basic Metabolic Panel: Recent Labs  Lab 06/20/18 1542 06/24/18 1054 06/24/18 2144 06/25/18 0327 06/26/18 0337 06/27/18 0311  NA 137 137  --  138 137  --   K 4.8 4.6  --  4.4 3.8  --   CL 102 101  --  101 99  --   CO2 25  --   --  26 26  --   GLUCOSE 211* 234*  --  123* 186*  --   BUN 34* 36*  --  43* 53*  --   CREATININE 1.56* 1.80* 2.05* 2.10* 2.18* 2.17*  CALCIUM 8.5*  --   --  8.3* 8.2*  --   MG  --   --   --   --  1.9  --    GFR Estimated Creatinine Clearance: 24.9 mL/min (A) (by C-G formula based on SCr of 2.17 mg/dL (H)). Liver Function Tests: Recent Labs  Lab 06/26/18 0337  AST 14*  ALT 18  ALKPHOS 68  BILITOT 0.7  PROT 7.8  ALBUMIN 2.3*   No results for input(s): LIPASE, AMYLASE in the last 168 hours. No results for input(s): AMMONIA in the last 168 hours. Coagulation profile No results for input(s): INR, PROTIME in the last 168 hours.  CBC: Recent Labs  Lab 06/22/18 1357 06/24/18 1020 06/24/18 1054 06/24/18 2144 06/25/18 0327 06/25/18 1422 06/26/18 0337  WBC 3.1* 3.4*  --  4.9 4.5 4.4 4.0  NEUTROABS 1.5* 1.8  --   --   --   --   --   HGB 6.4* 6.1* 6.8* 7.4* 7.8* 8.6* 8.6*  HCT 21.3* 20.2* 20.0* 24.6* 25.1* 27.2* 27.6*  MCV 93.4 95.3  --  93.5 91.3 90.1 89.6  PLT 126* 110*  --  121* 119* 110* 96*   Cardiac Enzymes: No results for input(s): CKTOTAL, CKMB, CKMBINDEX, TROPONINI in the last 168 hours. BNP (last 3 results) No results for input(s): PROBNP in the last 8760 hours. CBG: Recent Labs  Lab 06/25/18 2007 06/26/18 0729 06/26/18 1137 06/26/18 1548 06/26/18 2207  GLUCAP 214* 157* 235* 237* 251*   D-Dimer: No results for input(s): DDIMER in the last 72 hours. Hgb A1c: No  results for input(s): HGBA1C in the last 72 hours. Lipid Profile: No results for input(s): CHOL, HDL, LDLCALC, TRIG, CHOLHDL, LDLDIRECT in the last 72 hours. Thyroid function studies: No results for input(s): TSH, T4TOTAL, T3FREE, THYROIDAB in the last 72 hours.  Invalid input(s): FREET3 Anemia work up: No results for input(s): VITAMINB12, FOLATE, FERRITIN, TIBC, IRON, RETICCTPCT in the last 72 hours. Sepsis Labs: Recent Labs  Lab 06/24/18 2144 06/25/18 0327 06/25/18 1422 06/26/18 0337  WBC 4.9 4.5 4.4 4.0   Microbiology Recent Results (from the past 240 hour(s))  MRSA PCR Screening     Status: None   Collection Time: 06/24/18  8:18 PM  Result Value Ref Range Status   MRSA by PCR NEGATIVE NEGATIVE Final    Comment:        The GeneXpert MRSA Assay (FDA approved for NASAL specimens only), is one component of a comprehensive MRSA colonization surveillance program. It is not intended to diagnose MRSA infection nor to guide or monitor treatment for MRSA infections. Performed at [facility], [place] [address], [place], [place] [address]   Culture, blood (Routine X 2) w Reflex to ID Panel     Status: None (Preliminary result)   Collection Time: 06/26/18  4:38 PM  Result Value Ref Range Status   Specimen Description   Final    BLOOD RIGHT HAND Performed at [facility] [address], [place], [place] [address]    Special Requests   Final    BOTTLES DRAWN AEROBIC ONLY Blood Culture adequate volume Performed at [facility] [address] [place], [place] [address]    Culture   Final    NO GROWTH < 12 HOURS Performed at [facility] [address] [place], [place] [address]    Report Status PENDING  Incomplete  Culture, blood (Routine X 2) w Reflex to ID Panel     Status: None (Preliminary result)  Collection Time: 06/26/18  4:45 PM  Result Value Ref Range Status   Specimen Description   Final    BLOOD  LEFT ANTECUBITAL Performed at [facility] [address] [place], [place] [address]    Special Requests   Final    BOTTLES DRAWN AEROBIC AND ANAEROBIC Blood Culture adequate volume Performed at [facility] [address], [place], [place] [address]    Culture   Final    NO GROWTH < 12 HOURS Performed at [facility] [address] [place], [place] [address]    Report Status PENDING  Incomplete     Medications:   . sodium chloride   Intravenous Once  . aspirin EC  81 mg Oral Daily  . clopidogrel  75 mg Oral Daily  . feeding supplement (ENSURE ENLIVE)  237 mL Oral Q24H  . gabapentin  100 mg Oral QHS  . heparin  5,000 Units Subcutaneous Q8H  . insulin aspart  0-5 Units Subcutaneous QHS  . insulin aspart  0-9 Units Subcutaneous TID WC  . insulin glargine  15 Units Subcutaneous BID  . mouth rinse  15 mL Mouth Rinse BID  . metoprolol succinate  100 mg Oral Daily  . nitroGLYCERIN  0.4 mg Sublingual Once  . polyethylene glycol  17 g Oral Daily  . pravastatin  40 mg Oral q1800  . sodium chloride flush  3 mL Intravenous Q12H  . tamsulosin  0.4 mg Oral Daily   Continuous Infusions: . sodium chloride    . ceFEPime (MAXIPIME) IV Stopped (06/26/18 1727)  . [START ON 06/28/2018] vancomycin        LOS: 3 days   Charlynne Cousins  [facility]   *Please refer to [email or web address], password [email or web address] to get updated schedule on who will round on this patient, as hospitalists switch teams weekly. If 7PM-7AM, please contact night-coverage at [email or web address], password [email or web address] for any overnight needs.  06/27/2018, 7:26 AM

## 2018-06-27 NOTE — Consult Note (Signed)
Consultation Note Date: 06/27/2018   Patient Name: Ricardo Watkins  DOB: 09-Dec-1930  MRN: [id number]  Age / Sex: 85 y.o., male  PCP: Ricardo Frizzle, MD Referring Physician: Aileen Watkins, Ricardo Klippel, MD  Reason for Consultation: Establishing goals of care, Non pain symptom management and Psychosocial/spiritual support  HPI/Patient Profile:  85 y.o. male  with past medical history of MDS, CKD3, HF with an EF of 30-35%, PVD s/p renal stenting, prostate cancer s/p radiation, and CAD not appropriate for invasive therapies, he has a right upper lobe lung mass of uncertain etiology - who was admitted on 06/24/2017 with acute respiratory failure from volume overload.  He was found to have bacteremia and pneumonia.  Ricardo Watkins has had 14 ED visits in the last year and 5 hospital admissions in 4 months.   Clinical Assessment and Goals of Care:  I have reviewed medical records including EPIC notes, labs and imaging, received report from the care team, assessed the patient and then met with his wife Ricardo Watkins) and his daughter Ricardo Watkins)  to discuss diagnosis prognosis, Ricardo Watkins, Ricardo Watkins wishes, disposition and options.  I introduced Palliative Medicine as specialized medical care for people living with serious illness. It focuses on providing relief from the symptoms and stress of a serious illness. The goal is to improve quality of life for both the patient and the family.  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  He and his wife Ricardo Watkins will have been married for 66 years on 06/29/2017.  As far as functional and nutritional status he has good days and bad but he was able to get from room to room with his walker.  On certain  days he ate pretty well.  Ricardo Watkins closely manages his care - helping with ADLs, managing medications, "I'll sit up with him all night if I have to".  We discussed his current illness and what it means in the larger context of his on-going co-morbidities.  Natural disease trajectory and expectations at Ricardo Watkins were discussed.  Ricardo Watkins and Ricardo Watkins are well aware that their father has a very poor heart, his kidneys are failing and that his MDS is going to continue to worsen.  I attempted to elicit values and goals of care important to the patient.  The patient and his wife decided years ago that they wanted no life support.  They both agree that CPR would break ribs and likely cause bleeding - they do not want it.  We also discussed hemodialysis, ventilators and feeding tubes.  The family does not want any of those things.  They are very clear that he is a DNR/DNI.  I reassured Ricardo Watkins that we do not need the Advanced Directive paper work as he is already clearly a DNR/DNI.    Ricardo Watkins and  Ricardo Watkins complained of the patient's panic attacks they happened during his short time at home and they have been giving him problems here in the hospital.   We discussed initiating an anti-anxiety medication at bedtime and PRN during the day.  We talked about the family's goals.  Per Ricardo Watkins - "I don't want Hospice now.  Hospice will change things.  I don't want a hospital bed.  He will see hospice and give up.  I just want to take him home, let him be in his own bed and make things as normal as possible for as long as possible."  Ricardo Watkins did say that when [facility] no longer has his mind about him she will consider bringing in hospice.    Ricardo Watkins took care of both of her parents at home as they died.  They called in hospice at the last minute.  She wants very badly for Ricardo Watkins to be at home as her father was.  We talked about making a plan to die at home.  The way to have a comfortable death at home is to bring in Hospice.   Otherwise the  default is that he will die here in the hospital.  Ricardo Watkins understands that when the family is ready to discontinue blood transfusions Hospice will support.  She knows that if she is at home Dr. Dennard Watkins will be her point of contact to establish Hospice services.    I attempted to explain how quickly patients with the combination of  blood disorder, heart failure and kidney failure decline in order to encourage Ricardo Watkins to plan ahead - but she is very set in her decision that she wants to take him home without hospice.  Questions and concerns were addressed.  Hard Choices booklet left for review with daughter Ricardo Watkins. The family was encouraged to call with questions or concerns.    Primary Decision Maker:  PATIENT    SUMMARY OF RECOMMENDATIONS    Started Xanax 0.25 mg qhs and BID PRN for anxiety.   If he has a panic attack he will need a stronger dose of IV Ativan.  Please ensure that Ricardo Watkins has ativan solution at discharge in order to keep him comfortable and avoid panic attacks at home.  Confirmed:  DNR / DNI, No feeding tube, No hemodialysis  Wife not ready for Hospice as she wants her husband to continue to receive blood transfusions.   I'm concerned as I believe blood transfusions may cause volume overload - he may not tolerate them any longer.  Family has a great deal of faith in Dr. Dennard Watkins, PCP.  I think he would be most helpful in helping Ricardo Watkins know when to accept Hospice services.   Hopefully the timing will work out and Ricardo Watkins will be able to die in the comfort of his own bed at home.  Ricardo Watkins will need a great deal of support thru this process.  Per Ricardo Watkins, when family members have died in the past Ricardo Watkins required a sedative.   Code Status/Advance Care Planning:  DNR / DNI.   Advanced directive paper work is irrelevant.  His wife and daughter are his surrogate Media planner.   Symptom Management:   Added Xanax  Additional Recommendations (Limitations, Scope,  Preferences):  Full Scope Treatment  Psycho-social/Spiritual:   Desire for further Chaplaincy support:  The patient's pastor is present at bedside.  Prognosis:  Likely weeks.  He is at high risk for acute decline and death.   Discharge Planning: Home with Home Health  Primary Diagnoses: Present on Admission: . THROMBOCYTOPENIA . Myelodysplasia (myelodysplastic syndrome) ([place]) . Essential hypertension . Chronic systolic CHF (congestive heart failure) ([place]) . CKD (chronic kidney disease), stage IV ([place]) . Chronic ITP (idiopathic thrombocytopenia) ([facility]) . Acute on chronic combined systolic and diastolic CHF (congestive heart failure) ([place])   I have reviewed the medical record, interviewed the patient and family, and examined the patient. The following aspects are pertinent.  Past Medical History:  Diagnosis Date  . Adenomatous colon polyp   . CAD (coronary artery disease)    30% LAD Stenosis, 70% ramus intermedius stenosis, treated with PTCA and angioplasty by Dr Albertine Patricia 2004  . Cataract    right eye  . CHF (congestive heart failure) ([place])   . Chronic ITP (idiopathic thrombocytopenia) ([facility])   . Cough, persistent 11/04/2015  . Diverticulosis   . DM (diabetes mellitus) ([place])   . DVT (deep venous thrombosis) ([place])    secondary to surgery  . Dyspnea   . Fever 05/01/2018  . Hyperlipidemia   . Hypertension   . Inguinal hernia    right  . Laceration of finger of right hand 05/01/2018   INDEX FINGER  . 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 Microcytic anemia 07/07/2015  . 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 Non Hodgkin's lymphoma ([place])   . Pleural effusion, left 11/04/2015  . Prostate CA ([place]) 09/10/2011   Gleason 3+3 R, 3+4 L lobe May 2007 Rx Radioactive seed implants Dr [name]  . PVD (peripheral vascular disease) ([place])    rt renal artery stent  . Renal insufficiency   . Thrombocytopenia ([place])   . 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 Thyroid nodule    left lower lobe (annual monitoring).  . Vitamin D deficiency    Social History   Socioeconomic History  . Marital status: Married    Spouse name: Not on file  . Number of children: 4  . Years of education: Not on file  . Highest education level: Not on file  Occupational History  . Occupation: retired  Scientific laboratory technician  . Financial resource strain: Not on file  . Food insecurity:    Worry: Not on file    Inability: Not on file  . Transportation needs:    Medical: Not on file    Non-medical: Not on file  Tobacco Use  . Smoking status: Former Smoker    Packs/day: 0.50    Years: 20.00    Pack years: 10.00    Types: Pipe, Cigarettes    Last attempt to quit: 06/21/1958    Years since quitting: 60.0  . Smokeless tobacco: Never Used  Substance and Sexual Activity  . Alcohol use: No    Alcohol/week: 0.0 standard drinks  . Drug use: No  . Sexual activity: Yes    Partners: Female  Lifestyle  . Physical activity:    Days per week: Not on file    Minutes per session: Not on file  . Stress: Not on file  Relationships  . Social connections:    Talks on phone: Not on file    Gets together: Not on file    Attends religious service: Not on file    Active member of club or organization: Not on file    Attends meetings of clubs or organizations: Not on file  Relationship status: Not on file  Other Topics Concern  . Not on file  Social History Narrative  . Not on file   Family History  Problem Relation Age of Onset  . Dementia Father   . Lymphoma Sister   . Prostate cancer Brother   . Breast cancer Sister   . Diabetes Brother   . Diabetes Sister   . Heart disease Brother   . Asthma Son    Scheduled Meds: . sodium chloride   Intravenous Once  . ALPRAZolam  0.25 mg Oral QHS  . aspirin EC  81 mg Oral Daily  . clopidogrel   75 mg Oral Daily  . feeding supplement (ENSURE ENLIVE)  237 mL Oral Q24H  . furosemide  80 mg Intravenous Q12H  . gabapentin  100 mg Oral QHS  . heparin  5,000 Units Subcutaneous Q8H  . insulin aspart  0-5 Units Subcutaneous QHS  . insulin aspart  0-9 Units Subcutaneous TID WC  . insulin glargine  15 Units Subcutaneous BID  . mouth rinse  15 mL Mouth Rinse BID  . metoprolol succinate  100 mg Oral Daily  . nitroGLYCERIN  0.4 mg Sublingual Once  . polyethylene glycol  17 g Oral Daily  . pravastatin  40 mg Oral q1800  . sodium chloride flush  3 mL Intravenous Q12H  . tamsulosin  0.4 mg Oral Daily   Continuous Infusions: . sodium chloride    . ceFEPime (MAXIPIME) IV Stopped (06/26/18 1727)  . [START ON 06/28/2018] vancomycin     PRN Meds:.sodium chloride, acetaminophen, ALPRAZolam, chlorpheniramine-HYDROcodone, metoprolol tartrate, nitroGLYCERIN, ondansetron **OR** ondansetron (ZOFRAN) IV, sodium chloride flush Allergies  Allergen Reactions  . Glucophage [Metformin Hydrochloride] Other (See Comments)    Chest pain  . Zetia [Ezetimibe] Other (See Comments)    weakness  . Fenofibrate Rash  . Niacin-Lovastatin Er Rash   Review of Systems patient slightly confused.  Denies pain.   Physical Exam  Elderly pleasant gentleman.  Appears confused compared to 3 weeks ago.  Awake, alert, cooperative. CV rrr with murmur resp increased work of breathing at rest.  On supplemental oxygen Abdomen soft, nt, nd   Vital Signs: BP (!) 107/48   Watkins 93   Temp 99.3 F (37.4 C) (Axillary)   Resp (!) 25   Ht _0  (1.753 m)   Wt 76.2 kg   SpO2 97%   BMI 24.81 kg/m  Pain Scale: 0-10   Pain Score: 0-No pain   SpO2: SpO2: 97 % O2 Device:SpO2: 97 % O2 Flow Rate: .O2 Flow Rate (L/min): 2 L/min  IO: Intake/output summary:   Intake/Output Summary (Last 24 hours) at 06/27/2018 0956 Last data filed at 06/27/2018 0000 Gross per 24 hour  Intake 600.1 ml  Output 400 ml  Net 200.1 ml    LBM:  Last BM Date: 06/23/18 Baseline Weight: Weight: 79.9 kg Most recent weight: Weight: 76.2 kg     Palliative Assessment/Data: 30%   Flowsheet Rows     Most Recent Value  Intake Tab  Referral Department  Hospitalist  Unit at Time of Referral  ER  Palliative Care Primary Diagnosis  Cardiac  Date Notified  06/25/18  Palliative Care Type  Return patient Palliative Care  Reason for referral  End of Life Care Assistance  Date of Admission  06/24/18  # of days IP prior to Palliative referral  1  Clinical Assessment  Psychosocial & Spiritual Assessment  Palliative Care Outcomes      Time In:  9:00 Time Out: 10:10 Time Total: 70 min Greater than 50%  of this time was spent counseling and coordinating care related to the above assessment and plan.  Signed by: Florentina Jenny, PA-C Palliative Medicine Pager: [phone]  Please contact Palliative Medicine Team phone at [phone] for questions and concerns.  For individual provider: See Shea Evans

## 2018-06-28 DIAGNOSIS — Z515 Encounter for palliative care: Secondary | ICD-10-CM

## 2018-06-28 DIAGNOSIS — J9621 Acute and chronic respiratory failure with hypoxia: Secondary | ICD-10-CM

## 2018-06-28 LAB — BASIC METABOLIC PANEL
Anion gap: 12 (ref 5–15)
BUN: 68 mg/dL — ABNORMAL HIGH (ref 8–23)
CO2: 30 mmol/L (ref 22–32)
Calcium: 8.4 mg/dL — ABNORMAL LOW (ref 8.9–10.3)
Chloride: 95 mmol/L — ABNORMAL LOW (ref 98–111)
Creatinine, Ser: 2.24 mg/dL — ABNORMAL HIGH (ref 0.61–1.24)
GFR calc Af Amer: 30 mL/min — ABNORMAL LOW (ref >60)
GFR calc non Af Amer: 26 mL/min — ABNORMAL LOW (ref >60)
Glucose, Bld: 170 mg/dL — ABNORMAL HIGH (ref 70–99)
Potassium: 4.2 mmol/L (ref 3.5–5.1)
SODIUM: 137 mmol/L (ref 135–145)

## 2018-06-28 LAB — CBC
HCT: 27.7 % — ABNORMAL LOW (ref 39.0–52.0)
Hemoglobin: 8.6 g/dL — ABNORMAL LOW (ref 13.0–17.0)
MCH: 28.6 pg (ref 26.0–34.0)
MCHC: 31 g/dL (ref 30.0–36.0)
MCV: 92 fL (ref 80.0–100.0)
Platelets: 125 10*3/uL — ABNORMAL LOW (ref 150–400)
RBC: 3.01 MIL/uL — ABNORMAL LOW (ref 4.22–5.81)
RDW: 17.2 % — ABNORMAL HIGH (ref 11.5–15.5)
WBC: 4.4 10*3/uL (ref 4.0–10.5)
nRBC: 0 % (ref 0.0–0.2)

## 2018-06-28 LAB — GLUCOSE, CAPILLARY
GLUCOSE-CAPILLARY: 159 mg/dL — AB (ref 70–99)
Glucose-Capillary: 212 mg/dL — ABNORMAL HIGH (ref 70–99)

## 2018-06-28 MED ORDER — ALPRAZOLAM 0.25 MG PO TABS: 0.2500 mg | ORAL_TABLET | Freq: Two times a day (BID) | ORAL | 0 refills | Status: DC | PRN

## 2018-06-28 MED ORDER — ONDANSETRON HCL 4 MG PO TABS: 4.0000 mg | ORAL_TABLET | Freq: Four times a day (QID) | ORAL | 0 refills | Status: DC | PRN

## 2018-06-28 MED ORDER — ASPIRIN 81 MG PO TBEC: 81.0000 mg | DELAYED_RELEASE_TABLET | Freq: Every day | ORAL | 0 refills | Status: DC

## 2018-06-28 MED ORDER — INSULIN GLARGINE 100 UNIT/ML SOLOSTAR PEN: 15.0000 [IU] | PEN_INJECTOR | Freq: Every day | SUBCUTANEOUS | 3 refills | Status: AC

## 2018-06-28 NOTE — Progress Notes (Signed)
Patient being discharged.  He's feeling better today.    I answered questions from the family.  The most important question being "if his blood drops again can we come back to the hospital and receive another transfusion?"   I explained that if the blood transfusion was going to be helpful and not harmful to him then he certainly could receive it.  We talked about heart failure and kidney failure causing fluid overload quickly in the setting of blood transfusion.  Family felt that low dose benzodiazepine was very helpful.  Dr. Leafy Kindle will continue this PRN on discharge.  Family plans to use it very sparingly.  Florentina Jenny, PA-C Palliative Medicine Pager: [phone]   Time 15 min.

## 2018-06-28 NOTE — Care Management Important Message (Signed)
Important Message  Patient Details  Name: DANTA BAUMGARDNER MRN: [id number] Date of Birth: [birth year]   Medicare Important Message Given:  Yes    Kerin Salen 06/28/2018, 11:42 AMImportant Message  Patient Details  Name: KOHLER PELLERITO MRN: [id number] Date of Birth: [birth year]   Medicare Important Message Given:  Yes    Kerin Salen 06/28/2018, 11:41 AM

## 2018-06-28 NOTE — Discharge Instructions (Signed)
1) follow-up with oncologist [name] in about a week or so to discuss further management of your ongoing anemia --- you may need repeat complete blood count test to determine if you need further transfusions 2) if you do decide to go with palliative care and hospice-----Dr. Gorsuch's office can help you get in touch with palliative care and hospice team--- certainly can help you focus on comfort Measures 3)Avoid ibuprofen/Advil/Aleve/Motrin/Goody Powders/Naproxen/BC powders/Meloxicam/Diclofenac/Indomethacin and other Nonsteroidal anti-inflammatory medications as these will make you more likely to bleed and can cause stomach ulcers, can also cause Kidney problems.

## 2018-06-28 NOTE — Progress Notes (Signed)
Tele monitoring reported at 0104 that patient had non-sustained SVT 11 beats, rate 167 at 0102. Patient resting. Asymptomatic.No significant change in VS's. HR at 0106, 103bpm. Patient denied pain and/or shortness of breath. Notified MD on call via text/page. Awaiting return call.

## 2018-06-28 NOTE — Discharge Summary (Signed)
Ricardo Watkins, is a 85 y.o. male  DOB 1930-12-20  MRN [id number].  Admission date:  06/24/2018  Admitting Physician  Charlynne Cousins, MD  Discharge Date:  06/28/2018   Primary MD  [name], MD  Recommendations for primary care physician for things to follow:   1) follow-up with oncologist [name] in about a week or so to discuss further management of your ongoing anemia --- you may need repeat complete blood count test to determine if you need further transfusions 2) if you do decide to go with palliative care and hospice-----Dr. Gorsuch's office can help you get in touch with palliative care and hospice team--- certainly can help you focus on comfort Measures 3)Avoid ibuprofen/Advil/Aleve/Motrin/Goody Powders/Naproxen/BC powders/Meloxicam/Diclofenac/Indomethacin and other Nonsteroidal anti-inflammatory medications as these will make you more likely to bleed and can cause stomach ulcers, can also cause Kidney problems.  Admission Diagnosis  Acute pulmonary edema (HCC) [J81.0] Symptomatic anemia [D64.9]   Discharge Diagnosis  Acute pulmonary edema (HCC) [J81.0] Symptomatic anemia [D64.9]    Active Problems:   THROMBOCYTOPENIA   Essential hypertension   Myelodysplasia (myelodysplastic syndrome) (HCC)   Symptomatic anemia   Acute pulmonary edema (HCC)   Type 2 diabetes mellitus (HCC)   Acute on chronic respiratory failure with hypoxia (HCC)   Chronic ITP (idiopathic thrombocytopenia) (HCC)   CKD (chronic kidney disease), stage IV (HCC)   Acute on chronic combined systolic and diastolic CHF (congestive heart failure) (HCC)   Chronic systolic CHF (congestive heart failure) (HCC)   Malnutrition of moderate degree   Anxiety state      Past Medical History:  Diagnosis Date  . Adenomatous colon polyp   . CAD (coronary artery disease)    30% LAD Stenosis, 70% ramus intermedius stenosis,  treated with PTCA and angioplasty by Dr Albertine Patricia 2004  . Cataract    right eye  . CHF (congestive heart failure) ([place])   . Chronic ITP (idiopathic thrombocytopenia) (HCC)   . Cough, persistent 11/04/2015  . Diverticulosis   . DM (diabetes mellitus) ([place])   . DVT (deep venous thrombosis) ([place])    secondary to surgery  . Dyspnea   . Fever 05/01/2018  . Hyperlipidemia   . Hypertension   . Inguinal hernia    right  . Laceration of finger of right hand 05/01/2018   INDEX FINGER  . 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 Microcytic anemia 07/07/2015  . 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 Non Hodgkin's lymphoma ([place])   . Pleural effusion, left 11/04/2015  . 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 PVD (peripheral vascular disease) ([place])    rt renal artery stent  . Renal insufficiency   . Thrombocytopenia ([place])   . Thrombotic stroke ([place]) 09/10/2011   January 18, 2011 infarct genu & post limb R internal capsule - acute; previous lacunar infarcts/extensive white matter dis  .  Thyroid nodule    left lower lobe (annual monitoring).  . Vitamin D deficiency     Past Surgical History:  Procedure Laterality Date  . APPENDECTOMY     patient ?  Marland Kitchen CARDIAC CATHETERIZATION    . CATARACT EXTRACTION    . CHEST TUBE INSERTION Left 02/10/2016   Procedure: INSERTION PLEURAL DRAINAGE CATHETER;  Surgeon: [name], MD;  Location: [facility];  Service: Thoracic;  Laterality: Left;  . CHEST TUBE INSERTION Left 04/08/2016   Procedure: CHEST TUBE INSERTION;  Surgeon: [name], MD;  Location: [facility];  Service: Thoracic;  Laterality: Left;  . COLONOSCOPY    . CORONARY ANGIOPLASTY    . CYSTOURETHROSCOPY     ROBOTIC ARM NUCLETRON SEED IMPLANTATION OF PROSTATE  . EXPLORATORY LAPAROTOMY     For evaluation of lymphoma  . REMOVAL OF PLEURAL DRAINAGE CATHETER Left 04/08/2016   Procedure: REMOVAL OF  PLEURAL DRAINAGE CATHETER;  Surgeon: [name], MD;  Location: [facility];  Service: Thoracic;  Laterality: Left;       HPI  from the history and physical done on the day of admission:    Chief Complaint: SOb  HPI: Ricardo Watkins is a 85 y.o. male past medical history of chronic diastolic heart failure, significant peripheral vascular disease, remote history of prostate cancer, status post renal artery stenting, chronic ITP, on chronic home O2 CAD status post stenting, recently deemed not an intervention candidate,, myelodysplastic syndrome with 5 q. deletion, will get frequent checks of her hemoglobin with his last one on Thursday where his hemoglobin was 6.4 who gets frequent transfusions every 2 weeks recently discharged from the hospital on 06/12/2018 for sepsis due to enterococcal bacteremia and right upper lobe pneumonia, his Lasix was held on the last admission and was restarted on 12/31 2019 comes into the hospital for intermittent chest tightness, which is usually worst at night and progressive shortness of breath to the point where he did not sleep last night, he went to the cancer center today to get his blood transfusion but they referred him to the ED as they can transfuse him at the cancer center today.    In the ED: His hemoglobin was checked and it was 6.8 he was given a unit of blood and developed pulmonary edema, so we were consulted for admission and evaluation.  Chest x-ray was done that showed pulmonary edema with right small pleural effusion     Hospital Course:   Brief summary  Ricardo Watkins is an 85 y.o. male malepast medical history of chronic diastolic heart failure, significant peripheral vascular disease, remote history of prostate cancer, status post renal artery stenting, chronic ITP, on chronic home O2 CAD status post stenting,  deemed not an intervention candidate,, myelodysplastic syndrome with 5 q. deletion, will get frequent checks of her hemoglobin with  his last one on Thursday where his hemoglobin was 6.4 who gets frequent transfusions every 2 weeks recently discharged from the hospital on 06/12/2018 for sepsis due to enterococcal bacteremia and right upper lobe pneumonia, his Lasix was held on the last admission and was restarted on12/31 2019 comes into the hospital for intermittent chest tightness, which is usually worst at night and progressive shortness of breath to the point where he did not sleep last night  Plan:- Acute respiratory failure with hypoxia due to acute pulmonary edema in the setting of acute on chronic combined diastolic heart failure: Likely due to holding of his Lasix several days and  the unit of packed red blood cells just tipped him over. Now off BiPAP,  improved with diuretics   Myelodysplastic syndrome with 5 q. deletion leading to symptomatic microcytic anemia: He is status post 2 units of packed red blood cells hemoglobin is 8.6. Has a history of coronary artery disease we will try to keep hemoglobin above 8.----- hemoglobin as of 07/08/2018 remains stable at 8.6  Chronic thrombocytopenia due to ITP: His have remained stable.  Chronic kidney disease stage IV: Stable, avoid nephrotoxic agents  Diabetes mellitus type 2: Given erratic oral intake avoid over aggressive glycemic control in order to avoid life-threatening hypoglycemia--home  insulin regimen has been adjusted accordingly  H/o CAD/PAD/Elevated cardiac biomarkers: In the setting of acute decompensated diastolic heart failure, he denies any chest pain, EKG showed no signs of ischemia--- no stents in over 10 years according to patient's wife and daughter, giving chronic thrombocytopenia and anemia requiring frequent transfusions family agreeable to stopping Plavix however they would like to continue aspirin 81 mg at this time  Goals of care: Poor prognosis--- palliative care consult/input appreciated, at this time patient's wife is not ready to make  him comfort measures, to follow-up with oncologist [name] in the clinic within a week and have further conversations  New onset of cough with a fever: X-ray shows no acute new infiltrates, he continues to have a cough. Blood cultures are neg Treated with Vanco and cefepime remains afebrile, no further antibiotics needed   Family Communication: Plan of care discussed with patient, wife and daughter at bedside Disposition Plan/Barrier to D/C:  Discharge home with home health services Code Status: DNR  Discharge Condition: Overall prognosis is poor  Follow UP--- oncologist in less than 1 week  iet and Activity recommendation:  As advised  Discharge Instructions    Discharge Instructions    Call MD for:  difficulty breathing, headache or visual disturbances   Complete by:  As directed    Call MD for:  persistant dizziness or light-headedness   Complete by:  As directed    Call MD for:  persistant nausea and vomiting   Complete by:  As directed    Call MD for:  severe uncontrolled pain   Complete by:  As directed    Call MD for:  temperature >100.4   Complete by:  As directed    Diet general   Complete by:  As directed    Discharge instructions   Complete by:  As directed    1) follow-up with oncologist [name] in about a week or so to discuss further management of your ongoing anemia --- you may need repeat complete blood count test to determine if you need further transfusions 2) if you do decide to go with palliative care and hospice-----Dr. Gorsuch's office can help you get in touch with palliative care and hospice team--- certainly can help you focus on comfort Measures 3)Avoid ibuprofen/Advil/Aleve/Motrin/Goody Powders/Naproxen/BC powders/Meloxicam/Diclofenac/Indomethacin and other Nonsteroidal anti-inflammatory medications as these will make you more likely to bleed and can cause stomach ulcers, can also cause Kidney problems.   Walk with assistance   Complete by:  As  directed         Discharge Medications     Allergies as of 06/28/2018      Reactions   Glucophage [metformin Hydrochloride] Other (See Comments)   Chest pain   Zetia [ezetimibe] Other (See Comments)   weakness   Fenofibrate Rash   Niacin-lovastatin Er Rash  Medication List    STOP taking these medications   clopidogrel 75 MG tablet Commonly known as:  PLAVIX     TAKE these medications   acetaminophen 500 MG tablet Commonly known as:  TYLENOL Take 1,000 mg by mouth every 6 (six) hours as needed for mild pain, moderate pain or headache.   ALPRAZolam 0.25 MG tablet Commonly known as:  XANAX Take 1 tablet (0.25 mg total) by mouth 2 (two) times daily as needed for anxiety or sleep (agitation).   aspirin 81 MG EC tablet Take 1 tablet (81 mg total) by mouth daily with breakfast. What changed:  when to take this   feeding supplement (ENSURE ENLIVE) Liqd Take 237 mLs by mouth 2 (two) times daily between meals.   furosemide 20 MG tablet Commonly known as:  LASIX Take 20 mg by mouth daily.   gabapentin 100 MG capsule Commonly known as:  NEURONTIN TAKE 1 CAPSULE BY MOUTH AT BEDTIME   Insulin Glargine 100 UNIT/ML Solostar Pen Commonly known as:  LANTUS SOLOSTAR Inject 15 Units into the skin at bedtime. INJECT 20 UNITS EVERY NIGHT AT BEDTIME What changed:    how much to take  how to take this  when to take this   loperamide 2 MG capsule Commonly known as:  IMODIUM Take 2 mg by mouth as needed for diarrhea or loose stools.   meclizine 12.5 MG tablet Commonly known as:  ANTIVERT Take 1 tablet (12.5 mg total) by mouth 3 (three) times daily as needed for dizziness.   metoprolol succinate 50 MG 24 hr tablet Commonly known as:  TOPROL-XL Take 1 tablet (50 mg total) by mouth daily. Take with or immediately following a meal.   multivitamin with minerals Tabs tablet Take 1 tablet by mouth every morning.   nitroGLYCERIN 0.4 MG SL tablet Commonly known as:   NITROSTAT Place 1 tablet (0.4 mg total) under the tongue every 5 (five) minutes as needed for chest pain.   NON FORMULARY Inject 1 Dose into the vein once a week. Platelet booster   ondansetron 4 MG tablet Commonly known as:  ZOFRAN Take 1 tablet (4 mg total) by mouth every 6 (six) hours as needed for nausea or vomiting.   OVER THE COUNTER MEDICATION Place 1 drop into both eyes daily as needed (dry eyes). Over the counter lubricating eye drop   polyethylene glycol packet Commonly known as:  MIRALAX / GLYCOLAX Take 17 g by mouth daily.   pravastatin 40 MG tablet Commonly known as:  PRAVACHOL TAKE 1 TABLET BY MOUTH ONCE A DAY AT [place]. What changed:  See the new instructions.   tamsulosin 0.4 MG Caps capsule Commonly known as:  FLOMAX TAKE 1 CAPSULE BY MOUTH ONCE DAILY   traMADol 50 MG tablet Commonly known as:  ULTRAM Take 1 tablet (50 mg total) by mouth every 6 (six) hours as needed for moderate pain.       Major procedures and Radiology Reports - PLEASE review detailed and final reports for all details, in brief -    Ct Abdomen Pelvis Wo Contrast  Result Date: 05/31/2018 CLINICAL DATA:  Bacteremia. EXAM: CT ABDOMEN AND PELVIS WITHOUT CONTRAST TECHNIQUE: Multidetector CT imaging of the abdomen and pelvis was performed following the standard protocol without IV contrast. COMPARISON:  CT scan of April 14, 2018. FINDINGS: Lower chest: Mild right pleural effusion is noted with adjacent subsegmental atelectasis. Hepatobiliary: No focal liver abnormality is seen. No gallstones, gallbladder wall thickening, or biliary dilatation.  Pancreas: Unremarkable. No pancreatic ductal dilatation or surrounding inflammatory changes. Spleen: Normal in size without focal abnormality. Adrenals/Urinary Tract: Adrenal glands appear normal. Stable exophytic right renal cyst is noted. No hydronephrosis or renal obstruction is noted. No renal or ureteral calculi are noted. Urinary bladder  is unremarkable. Stomach/Bowel: Stomach is within normal limits. Appendix appears normal. No evidence of bowel wall thickening, distention, or inflammatory changes. Sigmoid diverticulosis is noted without inflammation. Vascular/Lymphatic: Aortic atherosclerosis. No enlarged abdominal or pelvic lymph nodes. Reproductive: Status post prostatic brachytherapy seed placement. Other: Large fat containing right inguinal hernia is noted which extends into the scrotum. This is stable compared to prior exam. Musculoskeletal: No acute or significant osseous findings. IMPRESSION: Mild right pleural effusion is noted with adjacent subsegmental atelectasis. Sigmoid diverticulosis without inflammation. Status post prostatic brachytherapy seed placement. Stable large fat containing right inguinal hernia is noted which extends into the scrotum. Aortic Atherosclerosis (ICD10-I70.0). Electronically Signed   By: Marijo Conception, M.D.   On: 05/31/2018 12:50   Dg Chest 1 View  Result Date: 06/03/2018 CLINICAL DATA:  Post right-sided thoracentesis EXAM: CHEST  1 VIEW COMPARISON:  05/31/2018; chest CT-06/01/2018 FINDINGS: Grossly unchanged cardiac silhouette and mediastinal contours with unchanged appearance of mixed heterogeneous and consolidative right upper lobe mass with partial obscuration of the right paratracheal stripe. Unchanged appearance of known left upper lobe spiculated nodule. Pulmonary venous congestion without frank evidence of edema. Interval reduction/resolution of right-sided pleural effusion post thoracentesis. No pneumothorax. Unchanged trace left-sided pleural effusion. No acute osseous abnormalities. IMPRESSION: 1. Interval reduction/resolution of right-sided pleural effusion post thoracentesis. No pneumothorax. 2. Otherwise similar appearance of the chest including dominant consolidative/heterogeneous right upper lobe mass again worrisome for infection versus malignancy. 3. Unchanged slightly spiculated left  upper lobe pulmonary nodule. 4. Pulmonary venous congestion without frank evidence of edema. Electronically Signed   By: Sandi Mariscal M.D.   On: 06/03/2018 10:45   Dg Chest 2 View  Result Date: 06/05/2018 CLINICAL DATA:  Fever and shortness of breath today. EXAM: CHEST - 2 VIEW COMPARISON:  06/03/2018 FINDINGS: The heart is mildly enlarged but stable. Persistent dense right upper lobe airspace consolidation, likely pneumonia. No recurrent right pleural effusion. Stable right basilar atelectasis. Persistent small left effusion and left basilar atelectasis. IMPRESSION: Stable dense right upper lobe airspace consolidation. Persistent small left effusion and overlying atelectasis. Electronically Signed   By: Marijo Sanes M.D.   On: 06/05/2018 09:11   Dg Chest 2 View  Result Date: 05/31/2018 CLINICAL DATA:  New onset of fever. The patient has an upset stomach and is coughing. History of CHF, previous CVA, previous episodes of pneumonia, former smoker. EXAM: CHEST - 2 VIEW COMPARISON:  Portable chest x-ray of May 28, 2018 FINDINGS: There's been interval worsening in airspace opacity in the right upper lobe. The right lower lung is clear. The left lung is clear. The heart is top-normal in size. The pulmonary vascularity is not engorged. There is calcification in the wall of the aortic arch. IMPRESSION: Worsening airspace opacity in the right upper lobe compatible with pneumonia. No overt CHF. Thoracic aortic atherosclerosis. Electronically Signed   By: David  Martinique M.D.   On: 05/31/2018 12:09   Ct Chest Wo Contrast  Result Date: 06/01/2018 CLINICAL DATA:  Fever for 2 days. EXAM: CT CHEST WITHOUT CONTRAST TECHNIQUE: Multidetector CT imaging of the chest was performed following the standard protocol without IV contrast. COMPARISON:  Chest x-ray 05/31/2018 FINDINGS: Cardiovascular: Aortic atherosclerosis and diffuse coronary artery calcifications. Aorta is  normal caliber. Heart is normal size.  Mediastinum/Nodes: Mildly enlarged mediastinal lymph nodes. Subcarinal lymph node has a short axis diameter of 15 mm. No hilar or visible axillary adenopathy. Lungs/Pleura: Small left effusion and moderate right effusion. Rounded masslike opacity noted in the right upper lobe with surrounding ground-glass disease. Ground-glass disease throughout much of the right upper lobe. Compressive atelectasis in the right lower lobe. Spiculated nodule in the posterior left upper lobe measures 11 mm. Upper Abdomen: Imaging into the upper abdomen shows no acute findings. Musculoskeletal: Chest wall soft tissues are unremarkable. No acute bony abnormality. IMPRESSION: Rounded dense masslike consolidation in the right upper lobe with surrounding ground-glass disease throughout much of the right upper lobe. This could reflect large pulmonary mass/lung cancer or rounded pneumonia. Spiculated 11 mm nodule in the left upper lobe. Can not exclude primary lung cancer or metastasis. Mild mediastinal adenopathy. Moderate right pleural effusion and small left pleural effusion. Diffuse coronary artery disease Aortic Atherosclerosis (ICD10-I70.0). Electronically Signed   By: Rolm Baptise M.D.   On: 06/01/2018 16:29   Dg Chest Port 1 View  Result Date: 06/26/2018 CLINICAL DATA:  Fever EXAM: PORTABLE CHEST 1 VIEW COMPARISON:  06/24/2018 FINDINGS: Unchanged right upper lobe consolidation. No sizable pleural effusion or pneumothorax. Heart size is normal. Pulmonary edema has resolved. IMPRESSION: Unchanged appearance of right upper lobe pneumonia. Resolution of pulmonary edema. Electronically Signed   By: Ulyses Jarred M.D.   On: 06/26/2018 17:18   Dg Chest Port 1 View  Result Date: 06/24/2018 CLINICAL DATA:  85 y/o M; chest tightness, shortness of breath, fatigue, anemia. EXAM: PORTABLE CHEST 1 VIEW COMPARISON:  06/05/2018 chest radiograph FINDINGS: Large right upper lung zone consolidation. Interstitial and alveolar pulmonary edema.  Small left pleural effusion. Stable cardiac silhouette. Aortic calcific atherosclerosis. No acute osseous abnormality is evident. IMPRESSION: Stable large right upper lung zone consolidation. Interstitial and alveolar pulmonary edema. Small left pleural effusion. Electronically Signed   By: Kristine Garbe M.D.   On: 06/24/2018 15:56   US Thoracentesis Asp Pleural Space W/img Guide  Result Date: 06/03/2018 INDICATION: Symptomatic right sided pleural effusion. Please perform ultrasound-guided thoracentesis for therapeutic and diagnostic purposes. EXAM: US THORACENTESIS ASP PLEURAL SPACE W/IMG GUIDE COMPARISON:  Chest CT-06/01/2018 MEDICATIONS: None. COMPLICATIONS: None immediate. TECHNIQUE: Informed written consent was obtained from the patient after a discussion of the risks, benefits and alternatives to treatment. A timeout was performed prior to the initiation of the procedure. Initial ultrasound scanning demonstrates a small anechoic right-sided pleural effusion. The lower chest was prepped and draped in the usual sterile fashion. 1% lidocaine was used for local anesthesia. Under direct ultrasound guidance, a 19 gauge, 7-cm, Yueh catheter was introduced. An ultrasound image was saved for documentation purposes. The thoracentesis was performed. The catheter was removed and a dressing was applied. The patient tolerated the procedure well without immediate post procedural complication. The patient was escorted to have an upright chest radiograph. FINDINGS: A total of approximately 650 cc of serous fluid was removed. Requested samples were sent to the laboratory. IMPRESSION: Successful ultrasound-guided right sided thoracentesis yielding 650 cc of pleural fluid. Electronically Signed   By: Sandi Mariscal M.D.   On: 06/03/2018 10:46    Micro Results    Recent Results (from the past 240 hour(s))  MRSA PCR Screening     Status: None   Collection Time: 06/24/18  8:18 PM  Result Value Ref Range Status     MRSA by PCR NEGATIVE NEGATIVE Final  Comment:        The GeneXpert MRSA Assay (FDA approved for NASAL specimens only), is one component of a comprehensive MRSA colonization surveillance program. It is not intended to diagnose MRSA infection nor to guide or monitor treatment for MRSA infections. Performed at [facility], [place] [address], [place], [place] [address]   Culture, blood (Routine X 2) w Reflex to ID Panel     Status: None (Preliminary result)   Collection Time: 06/26/18  4:38 PM  Result Value Ref Range Status   Specimen Description   Final    BLOOD RIGHT HAND Performed at [facility] [address], [place], [place] [address]    Special Requests   Final    BOTTLES DRAWN AEROBIC ONLY Blood Culture adequate volume Performed at [facility] [address] [place], [place] [address]    Culture   Final    NO GROWTH 2 DAYS Performed at [facility] [address] [place], [place] [address]    Report Status PENDING  Incomplete  Culture, blood (Routine X 2) w Reflex to ID Panel     Status: None (Preliminary result)   Collection Time: 06/26/18  4:45 PM  Result Value Ref Range Status   Specimen Description   Final    BLOOD LEFT ANTECUBITAL Performed at [facility] [address], [place], [place] [address]    Special Requests   Final    BOTTLES DRAWN AEROBIC AND ANAEROBIC Blood Culture adequate volume Performed at [facility] [address] [place], [place] [address]    Culture   Final    NO GROWTH 2 DAYS Performed at [facility] [address], [place], [place] [address]    Report Status PENDING  Incomplete   Today   Subjective    Add Ricardo Watkins today has no new planes, no fevers, no chills, daughter and wife at bedside,          Patient has been seen and examined prior to discharge   Objective   Blood pressure 109/64, pulse (!) 105,  temperature 97.9 F (36.6 C), temperature source Oral, resp. rate 18, height 5\' 9"  (1.753 m), weight 76.2 kg, SpO2 94 %.   Intake/Output Summary (Last 24 hours) at 06/28/2018 1648 Last data filed at 06/28/2018 1358 Gross per 24 hour  Intake 660 ml  Output 1800 ml  Net -1140 ml   Exam Gen:- Awake , patient looks chronically ill ,but in no acute distress HEENT:- .AT, No sclera icterus Neck-Supple Neck,No JVD,.  Lungs-diminished in bases, no wheezing CV- S1, S2 normal, regular Abd-  +ve B.Sounds, Abd Soft, No tenderness,    Extremity/Skin:-     good pulses Psych-affect is appropriate, oriented x3 Neuro-no new focal deficits, no tremors    Data Review   CBC w Diff:  Lab Results  Component Value Date   WBC 4.4 06/28/2018   HGB 8.6 (L) 06/28/2018   HGB 11.1 (L) 06/23/2017   HCT 27.7 (L) 06/28/2018   HCT 37.7 (L) 06/23/2017   PLT 125 (L) 06/28/2018   PLT 85 (L) 06/23/2017   LYMPHOPCT 36 06/24/2018   LYMPHOPCT 53.0 (H) 06/23/2017   BANDSPCT 0 06/10/2018   MONOPCT 9 06/24/2018   MONOPCT 11.9 06/23/2017   EOSPCT 0 06/24/2018   EOSPCT 0.4 06/23/2017   BASOPCT 0 06/24/2018   BASOPCT 0.2 06/23/2017   CMP:  Lab Results  Component Value Date  NA 137 06/28/2018   NA 138 03/02/2016   K 4.2 06/28/2018   K 4.6 03/02/2016   CL 95 (L) 06/28/2018   CL 107 09/27/2012   CO2 30 06/28/2018   CO2 25 03/02/2016   BUN 68 (H) 06/28/2018   BUN 20.9 03/02/2016   CREATININE 2.24 (H) 06/28/2018   CREATININE 1.56 (H) 06/20/2018   CREATININE 1.8 (H) 03/02/2016   PROT 7.8 06/26/2018   PROT 8.2 03/02/2016   ALBUMIN 2.3 (L) 06/26/2018   ALBUMIN 2.6 (L) 03/02/2016   BILITOT 0.7 06/26/2018   BILITOT <0.30 03/02/2016   ALKPHOS 68 06/26/2018   ALKPHOS 84 03/02/2016   AST 14 (L) 06/26/2018   AST 21 03/02/2016   ALT 18 06/26/2018   ALT 29 03/02/2016  . Total Discharge time is about 33 minutes  Roxan Hockey M.D on 06/28/2018 at 4:48 PM  Pager---[phone]  Go to [email or web address]  password [email or web address] for contact info  [facility] - Office  [phone]

## 2018-06-28 NOTE — Evaluation (Signed)
Physical Therapy Evaluation Patient Details Name: Ricardo Watkins MRN: [id number] DOB: 1931/03/21 Today's Date: 06/28/2018   History of Present Illness  85 yo male admitted with acute respiratory failure, CHF, DM. Hx of DM, CHF, PVD, prostate ca, CAD, MDS, NHL, DVT  Clinical Impression  On eval, pt required Min assist +2 safety/equipment for mobility. He walked ~60 feet with a RW. O2 sats 94% on 2L [place] O2 during session. Pt is weak and fatigues easily. Per chart, plan is for pt to return home with family assisting as needed. Will follow during hospital stay.     Follow Up Recommendations Home health PT;Supervision/Assistance - 83 hour(at family' request)    Equipment Recommendations  None recommended by PT    Recommendations for Other Services       Precautions / Restrictions Precautions Precautions: Fall Precaution Comments: monitor O2 sats Restrictions Weight Bearing Restrictions: No      Mobility  Bed Mobility Overal bed mobility: Needs Assistance Bed Mobility: Supine to Sit     Supine to sit: Min guard;HOB elevated     General bed mobility comments: Increased time.   Transfers Overall transfer level: Needs assistance Equipment used: Rolling walker (2 wheeled) Transfers: Sit to/from Stand Sit to Stand: Min assist;+2 safety/equipment         General transfer comment: Assist to rise, stabilize, control descent. VCs safety, technique, hand placement  Ambulation/Gait Ambulation/Gait assistance: Min assist;+2 safety/equipment Gait Distance (Feet): 60 Feet Assistive device: Rolling walker (2 wheeled) Gait Pattern/deviations: Trunk flexed;Step-through pattern     General Gait Details: Remained on 2L [place] O2-sats 94% after ambulation. Assist to stabilize pt and maneuver with RW. Followed closely with recliner  Stairs            Wheelchair Mobility    Modified Rankin (Stroke Patients Only)       Balance Overall balance assessment: Needs assistance          Standing balance support: Bilateral upper extremity supported Standing balance-Leahy Scale: Poor                               Pertinent Vitals/Pain Pain Assessment: No/denies pain    Home Living Family/patient expects to be discharged to:: Private residence Living Arrangements: Spouse/significant other Available Help at Discharge: Family;Available 24 hours/day Type of Home: House Home Access: Ramped entrance     Home Layout: One level Home Equipment: Walker - 2 wheels;Cane - single point;Shower seat      Prior Function Level of Independence: Needs assistance   Gait / Transfers Assistance Needed: cane vs RW  ADL's / Homemaking Assistance Needed: wife assists with bathing, dressing        Hand Dominance        Extremity/Trunk Assessment   Upper Extremity Assessment Upper Extremity Assessment: Generalized weakness    Lower Extremity Assessment Lower Extremity Assessment: Generalized weakness    Cervical / Trunk Assessment Cervical / Trunk Assessment: Kyphotic  Communication   Communication: HOH  Cognition Arousal/Alertness: Awake/alert Behavior During Therapy: WFL for tasks assessed/performed Overall Cognitive Status: Within Functional Limits for tasks assessed                       Memory: Decreased short-term memory         General Comments: Wife and daughter present during session and provided info. Pt participated well with session      General Comments  Exercises     Assessment/Plan    PT Assessment Patient needs continued PT services  PT Problem List Decreased strength;Decreased balance;Decreased mobility;Decreased activity tolerance;Decreased knowledge of use of DME       PT Treatment Interventions DME instruction;Gait training;Functional mobility training;Therapeutic activities;Balance training;Patient/family education;Therapeutic exercise    PT Goals (Current goals can be found in the Care Plan section)   Acute Rehab PT Goals Patient Stated Goal: home PT Goal Formulation: With patient/family Time For Goal Achievement: 07/12/18 Potential to Achieve Goals: Fair    Frequency Min 3X/week   Barriers to discharge        Co-evaluation               AM-PAC PT "6 Clicks" Mobility  Outcome Measure Help needed turning from your back to your side while in a flat bed without using bedrails?: A Little Help needed moving from lying on your back to sitting on the side of a flat bed without using bedrails?: A Little Help needed moving to and from a bed to a chair (including a wheelchair)?: A Little Help needed standing up from a chair using your arms (e.g., wheelchair or bedside chair)?: A Little Help needed to walk in hospital room?: A Little Help needed climbing 3-5 steps with a railing? : A Lot 6 Click Score: 17    End of Session Equipment Utilized During Treatment: Gait belt;Oxygen Activity Tolerance: Patient tolerated treatment well Patient left: in chair;with call bell/phone within reach;with family/visitor present   PT Visit Diagnosis: Muscle weakness (generalized) (M62.81);Difficulty in walking, not elsewhere classified (R26.2);Unsteadiness on feet (R26.81)    Time: 6389-3734 PT Time Calculation (min) (ACUTE ONLY): 11 min   Charges:   PT Evaluation $PT Eval Moderate Complexity: [place], PT Acute Rehabilitation Services Pager: [phone] Office: [phone]

## 2018-06-29 ENCOUNTER — Telehealth: Payer: Self-pay | Admitting: *Deleted

## 2018-06-29 ENCOUNTER — Other Ambulatory Visit: Payer: Medicare Other

## 2018-06-29 ENCOUNTER — Ambulatory Visit: Payer: Medicare Other | Admitting: Hematology and Oncology

## 2018-06-29 ENCOUNTER — Ambulatory Visit: Payer: Medicare Other

## 2018-06-29 MED ORDER — DOXYCYCLINE HYCLATE 100 MG PO TABS: 100.0000 mg | ORAL_TABLET | Freq: Two times a day (BID) | ORAL | 0 refills | Status: DC

## 2018-06-29 NOTE — Telephone Encounter (Signed)
Received call from microbiology at [facility].   Reports that Aerobic culture returned positive for gram positive rods.   MD made aware and NO obtained: Doxycycline 100mg  PO BID x10 days Schedule F/U appt on 07/03/2018.  Prescription sent to pharmacy.   Call placed to patient. [place].

## 2018-06-29 NOTE — Telephone Encounter (Signed)
Call placed to patient daughter Juliann Pulse. Made aware and verbalized understanding.   Appointment scheduled.

## 2018-06-30 ENCOUNTER — Inpatient Hospital Stay (HOSPITAL_COMMUNITY)
Admission: EM | Admit: 2018-06-30 | Discharge: 2018-07-08 | DRG: 871 | Disposition: A | Discharge status: Hospice | Payer: Medicare Other | Attending: Family Medicine | Admitting: Family Medicine

## 2018-06-30 ENCOUNTER — Encounter (HOSPITAL_COMMUNITY): Payer: Self-pay

## 2018-06-30 ENCOUNTER — Emergency Department (HOSPITAL_COMMUNITY): Payer: Medicare Other

## 2018-06-30 ENCOUNTER — Other Ambulatory Visit: Payer: Self-pay

## 2018-06-30 DIAGNOSIS — E440 Moderate protein-calorie malnutrition: Secondary | ICD-10-CM | POA: Diagnosis present

## 2018-06-30 DIAGNOSIS — Z9981 Dependence on supplemental oxygen: Secondary | ICD-10-CM

## 2018-06-30 DIAGNOSIS — A4189 Other specified sepsis: Secondary | ICD-10-CM | POA: Diagnosis present

## 2018-06-30 DIAGNOSIS — Z807 Family history of other malignant neoplasms of lymphoid, hematopoietic and related tissues: Secondary | ICD-10-CM

## 2018-06-30 DIAGNOSIS — N179 Acute kidney failure, unspecified: Secondary | ICD-10-CM | POA: Diagnosis present

## 2018-06-30 DIAGNOSIS — E119 Type 2 diabetes mellitus without complications: Secondary | ICD-10-CM

## 2018-06-30 DIAGNOSIS — K5790 Diverticulosis of intestine, part unspecified, without perforation or abscess without bleeding: Secondary | ICD-10-CM | POA: Diagnosis present

## 2018-06-30 DIAGNOSIS — R1310 Dysphagia, unspecified: Secondary | ICD-10-CM | POA: Diagnosis not present

## 2018-06-30 DIAGNOSIS — Z8546 Personal history of malignant neoplasm of prostate: Secondary | ICD-10-CM | POA: Diagnosis not present

## 2018-06-30 DIAGNOSIS — N184 Chronic kidney disease, stage 4 (severe): Secondary | ICD-10-CM | POA: Diagnosis present

## 2018-06-30 DIAGNOSIS — I130 Hypertensive heart and chronic kidney disease with heart failure and stage 1 through stage 4 chronic kidney disease, or unspecified chronic kidney disease: Secondary | ICD-10-CM | POA: Diagnosis present

## 2018-06-30 DIAGNOSIS — Z888 Allergy status to other drugs, medicaments and biological substances status: Secondary | ICD-10-CM

## 2018-06-30 DIAGNOSIS — R0689 Other abnormalities of breathing: Secondary | ICD-10-CM | POA: Diagnosis not present

## 2018-06-30 DIAGNOSIS — E559 Vitamin D deficiency, unspecified: Secondary | ICD-10-CM | POA: Diagnosis present

## 2018-06-30 DIAGNOSIS — Z9849 Cataract extraction status, unspecified eye: Secondary | ICD-10-CM

## 2018-06-30 DIAGNOSIS — D509 Iron deficiency anemia, unspecified: Secondary | ICD-10-CM | POA: Diagnosis present

## 2018-06-30 DIAGNOSIS — M2550 Pain in unspecified joint: Secondary | ICD-10-CM | POA: Diagnosis not present

## 2018-06-30 DIAGNOSIS — Z86718 Personal history of other venous thrombosis and embolism: Secondary | ICD-10-CM | POA: Diagnosis not present

## 2018-06-30 DIAGNOSIS — E870 Hyperosmolality and hypernatremia: Secondary | ICD-10-CM | POA: Diagnosis not present

## 2018-06-30 DIAGNOSIS — Z8572 Personal history of non-Hodgkin lymphomas: Secondary | ICD-10-CM | POA: Diagnosis not present

## 2018-06-30 DIAGNOSIS — R918 Other nonspecific abnormal finding of lung field: Secondary | ICD-10-CM | POA: Diagnosis not present

## 2018-06-30 DIAGNOSIS — Z8249 Family history of ischemic heart disease and other diseases of the circulatory system: Secondary | ICD-10-CM

## 2018-06-30 DIAGNOSIS — Z794 Long term (current) use of insulin: Secondary | ICD-10-CM

## 2018-06-30 DIAGNOSIS — Z923 Personal history of irradiation: Secondary | ICD-10-CM

## 2018-06-30 DIAGNOSIS — Z87891 Personal history of nicotine dependence: Secondary | ICD-10-CM | POA: Diagnosis not present

## 2018-06-30 DIAGNOSIS — D693 Immune thrombocytopenic purpura: Secondary | ICD-10-CM | POA: Diagnosis present

## 2018-06-30 DIAGNOSIS — Z7401 Bed confinement status: Secondary | ICD-10-CM | POA: Diagnosis not present

## 2018-06-30 DIAGNOSIS — Z86010 Personal history of colonic polyps: Secondary | ICD-10-CM

## 2018-06-30 DIAGNOSIS — I5022 Chronic systolic (congestive) heart failure: Secondary | ICD-10-CM | POA: Diagnosis present

## 2018-06-30 DIAGNOSIS — I5042 Chronic combined systolic (congestive) and diastolic (congestive) heart failure: Secondary | ICD-10-CM | POA: Diagnosis present

## 2018-06-30 DIAGNOSIS — Z8042 Family history of malignant neoplasm of prostate: Secondary | ICD-10-CM

## 2018-06-30 DIAGNOSIS — J9621 Acute and chronic respiratory failure with hypoxia: Secondary | ICD-10-CM | POA: Diagnosis present

## 2018-06-30 DIAGNOSIS — E875 Hyperkalemia: Secondary | ICD-10-CM | POA: Diagnosis present

## 2018-06-30 DIAGNOSIS — R509 Fever, unspecified: Secondary | ICD-10-CM

## 2018-06-30 DIAGNOSIS — Z825 Family history of asthma and other chronic lower respiratory diseases: Secondary | ICD-10-CM

## 2018-06-30 DIAGNOSIS — I429 Cardiomyopathy, unspecified: Secondary | ICD-10-CM | POA: Diagnosis present

## 2018-06-30 DIAGNOSIS — B952 Enterococcus as the cause of diseases classified elsewhere: Secondary | ICD-10-CM | POA: Diagnosis not present

## 2018-06-30 DIAGNOSIS — R7881 Bacteremia: Secondary | ICD-10-CM | POA: Diagnosis not present

## 2018-06-30 DIAGNOSIS — G9340 Encephalopathy, unspecified: Secondary | ICD-10-CM | POA: Diagnosis not present

## 2018-06-30 DIAGNOSIS — I2510 Atherosclerotic heart disease of native coronary artery without angina pectoris: Secondary | ICD-10-CM | POA: Diagnosis present

## 2018-06-30 DIAGNOSIS — J158 Pneumonia due to other specified bacteria: Secondary | ICD-10-CM | POA: Diagnosis not present

## 2018-06-30 DIAGNOSIS — R0602 Shortness of breath: Secondary | ICD-10-CM | POA: Diagnosis not present

## 2018-06-30 DIAGNOSIS — H5462 Unqualified visual loss, left eye, normal vision right eye: Secondary | ICD-10-CM | POA: Diagnosis not present

## 2018-06-30 DIAGNOSIS — J181 Lobar pneumonia, unspecified organism: Secondary | ICD-10-CM | POA: Diagnosis not present

## 2018-06-30 DIAGNOSIS — A419 Sepsis, unspecified organism: Secondary | ICD-10-CM | POA: Diagnosis present

## 2018-06-30 DIAGNOSIS — D638 Anemia in other chronic diseases classified elsewhere: Secondary | ICD-10-CM | POA: Diagnosis not present

## 2018-06-30 DIAGNOSIS — E1122 Type 2 diabetes mellitus with diabetic chronic kidney disease: Secondary | ICD-10-CM | POA: Diagnosis not present

## 2018-06-30 DIAGNOSIS — J189 Pneumonia, unspecified organism: Secondary | ICD-10-CM | POA: Diagnosis present

## 2018-06-30 DIAGNOSIS — I10 Essential (primary) hypertension: Secondary | ICD-10-CM | POA: Diagnosis present

## 2018-06-30 DIAGNOSIS — E785 Hyperlipidemia, unspecified: Secondary | ICD-10-CM | POA: Diagnosis present

## 2018-06-30 DIAGNOSIS — K5900 Constipation, unspecified: Secondary | ICD-10-CM | POA: Diagnosis present

## 2018-06-30 DIAGNOSIS — B9689 Other specified bacterial agents as the cause of diseases classified elsewhere: Secondary | ICD-10-CM | POA: Diagnosis not present

## 2018-06-30 DIAGNOSIS — R4182 Altered mental status, unspecified: Secondary | ICD-10-CM | POA: Diagnosis not present

## 2018-06-30 DIAGNOSIS — Z803 Family history of malignant neoplasm of breast: Secondary | ICD-10-CM

## 2018-06-30 DIAGNOSIS — E1151 Type 2 diabetes mellitus with diabetic peripheral angiopathy without gangrene: Secondary | ICD-10-CM | POA: Diagnosis present

## 2018-06-30 DIAGNOSIS — Y95 Nosocomial condition: Secondary | ICD-10-CM | POA: Diagnosis present

## 2018-06-30 DIAGNOSIS — L8990 Pressure ulcer of unspecified site, unspecified stage: Secondary | ICD-10-CM

## 2018-06-30 DIAGNOSIS — I959 Hypotension, unspecified: Secondary | ICD-10-CM | POA: Diagnosis not present

## 2018-06-30 DIAGNOSIS — L89301 Pressure ulcer of unspecified buttock, stage 1: Secondary | ICD-10-CM | POA: Diagnosis present

## 2018-06-30 DIAGNOSIS — I110 Hypertensive heart disease with heart failure: Secondary | ICD-10-CM | POA: Diagnosis not present

## 2018-06-30 DIAGNOSIS — Z66 Do not resuscitate: Secondary | ICD-10-CM | POA: Diagnosis present

## 2018-06-30 DIAGNOSIS — Z8673 Personal history of transient ischemic attack (TIA), and cerebral infarction without residual deficits: Secondary | ICD-10-CM

## 2018-06-30 DIAGNOSIS — R000 Tachycardia, unspecified: Secondary | ICD-10-CM | POA: Diagnosis not present

## 2018-06-30 DIAGNOSIS — N183 Chronic kidney disease, stage 3 (moderate): Secondary | ICD-10-CM | POA: Diagnosis not present

## 2018-06-30 DIAGNOSIS — J90 Pleural effusion, not elsewhere classified: Secondary | ICD-10-CM | POA: Diagnosis not present

## 2018-06-30 DIAGNOSIS — Z7189 Other specified counseling: Secondary | ICD-10-CM | POA: Diagnosis not present

## 2018-06-30 DIAGNOSIS — Z955 Presence of coronary angioplasty implant and graft: Secondary | ICD-10-CM

## 2018-06-30 DIAGNOSIS — Z515 Encounter for palliative care: Secondary | ICD-10-CM | POA: Diagnosis not present

## 2018-06-30 DIAGNOSIS — Z6825 Body mass index (BMI) 25.0-25.9, adult: Secondary | ICD-10-CM

## 2018-06-30 DIAGNOSIS — Z833 Family history of diabetes mellitus: Secondary | ICD-10-CM

## 2018-06-30 DIAGNOSIS — D469 Myelodysplastic syndrome, unspecified: Secondary | ICD-10-CM | POA: Diagnosis present

## 2018-06-30 DIAGNOSIS — Z8701 Personal history of pneumonia (recurrent): Secondary | ICD-10-CM | POA: Diagnosis not present

## 2018-06-30 DIAGNOSIS — E538 Deficiency of other specified B group vitamins: Secondary | ICD-10-CM

## 2018-06-30 DIAGNOSIS — R799 Abnormal finding of blood chemistry, unspecified: Secondary | ICD-10-CM | POA: Diagnosis not present

## 2018-06-30 DIAGNOSIS — R0902 Hypoxemia: Secondary | ICD-10-CM | POA: Diagnosis not present

## 2018-06-30 DIAGNOSIS — Z7982 Long term (current) use of aspirin: Secondary | ICD-10-CM

## 2018-06-30 LAB — CBC WITH DIFFERENTIAL/PLATELET
Abs Immature Granulocytes: 0.2 10*3/uL — ABNORMAL HIGH (ref 0.00–0.07)
Basophils Absolute: 0 10*3/uL (ref 0.0–0.1)
Basophils Relative: 0 %
Eosinophils Absolute: 0 10*3/uL (ref 0.0–0.5)
Eosinophils Relative: 0 %
HCT: 28.7 % — ABNORMAL LOW (ref 39.0–52.0)
Hemoglobin: 8.8 g/dL — ABNORMAL LOW (ref 13.0–17.0)
Immature Granulocytes: 4 %
Lymphocytes Relative: 43 %
Lymphs Abs: 2.1 10*3/uL (ref 0.7–4.0)
MCH: 29.1 pg (ref 26.0–34.0)
MCHC: 30.7 g/dL (ref 30.0–36.0)
MCV: 95 fL (ref 80.0–100.0)
MONO ABS: 0.7 10*3/uL (ref 0.1–1.0)
MONOS PCT: 14 %
NEUTROS ABS: 1.9 10*3/uL (ref 1.7–7.7)
Neutrophils Relative %: 39 %
PLATELETS: 125 10*3/uL — AB (ref 150–400)
RBC: 3.02 MIL/uL — ABNORMAL LOW (ref 4.22–5.81)
RDW: 17.2 % — ABNORMAL HIGH (ref 11.5–15.5)
WBC: 4.9 10*3/uL (ref 4.0–10.5)
nRBC: 0 % (ref 0.0–0.2)

## 2018-06-30 LAB — URINALYSIS, ROUTINE W REFLEX MICROSCOPIC
BACTERIA UA: NONE SEEN
Bilirubin Urine: NEGATIVE
Glucose, UA: NEGATIVE mg/dL
Hgb urine dipstick: NEGATIVE
Ketones, ur: NEGATIVE mg/dL
Leukocytes, UA: NEGATIVE
Nitrite: NEGATIVE
Protein, ur: 30 mg/dL — AB
Specific Gravity, Urine: 1.016 (ref 1.005–1.030)
pH: 5 (ref 5.0–8.0)

## 2018-06-30 LAB — COMPREHENSIVE METABOLIC PANEL
ALT: 22 U/L (ref 0–44)
AST: 26 U/L (ref 15–41)
Albumin: 2 g/dL — ABNORMAL LOW (ref 3.5–5.0)
Alkaline Phosphatase: 62 U/L (ref 38–126)
Anion gap: 11 (ref 5–15)
BUN: 76 mg/dL — ABNORMAL HIGH (ref 8–23)
CO2: 30 mmol/L (ref 22–32)
Calcium: 8.1 mg/dL — ABNORMAL LOW (ref 8.9–10.3)
Chloride: 94 mmol/L — ABNORMAL LOW (ref 98–111)
Creatinine, Ser: 2.36 mg/dL — ABNORMAL HIGH (ref 0.61–1.24)
GFR calc Af Amer: 28 mL/min — ABNORMAL LOW (ref >60)
GFR calc non Af Amer: 24 mL/min — ABNORMAL LOW (ref >60)
Glucose, Bld: 178 mg/dL — ABNORMAL HIGH (ref 70–99)
Potassium: 5.6 mmol/L — ABNORMAL HIGH (ref 3.5–5.1)
Sodium: 135 mmol/L (ref 135–145)
Total Bilirubin: 1.4 mg/dL — ABNORMAL HIGH (ref 0.3–1.2)
Total Protein: 8 g/dL (ref 6.5–8.1)

## 2018-06-30 LAB — PROTIME-INR
INR: 1.29
PROTHROMBIN TIME: 15.9 s — AB (ref 11.4–15.2)

## 2018-06-30 LAB — I-STAT CG4 LACTIC ACID, ED
Lactic Acid, Venous: 1.73 mmol/L (ref 0.5–1.9)
Lactic Acid, Venous: 1.16 mmol/L (ref 0.5–1.9)

## 2018-06-30 LAB — GLUCOSE, CAPILLARY: Glucose-Capillary: 130 mg/dL — ABNORMAL HIGH (ref 70–99)

## 2018-06-30 MED ORDER — GABAPENTIN 100 MG PO CAPS
100.0000 mg | ORAL_CAPSULE | Freq: Every day | ORAL | Status: DC
  Administered 2018-06-30 – 2018-07-07 (×7): 100 mg via ORAL
  Filled 2018-06-30 (×7): qty 1

## 2018-06-30 MED ORDER — TRAMADOL HCL 50 MG PO TABS
50.0000 mg | ORAL_TABLET | Freq: Four times a day (QID) | ORAL | Status: DC | PRN
  Administered 2018-07-05 – 2018-07-08 (×5): 50 mg via ORAL
  Filled 2018-06-30 (×5): qty 1

## 2018-06-30 MED ORDER — HEPARIN SODIUM (PORCINE) 5000 UNIT/ML IJ SOLN
5000.0000 [IU] | Freq: Three times a day (TID) | INTRAMUSCULAR | Status: DC
  Administered 2018-06-30 – 2018-07-03 (×8): 5000 [IU] via SUBCUTANEOUS
  Filled 2018-06-30 (×8): qty 1

## 2018-06-30 MED ORDER — SODIUM CHLORIDE 0.9 % IV SOLN
2.0000 g | Freq: Once | INTRAVENOUS | Status: AC
  Administered 2018-06-30: 2 g via INTRAVENOUS
  Filled 2018-06-30: qty 2

## 2018-06-30 MED ORDER — POLYETHYLENE GLYCOL 3350 17 G PO PACK
17.0000 g | PACK | Freq: Every day | ORAL | Status: DC
  Administered 2018-07-01 – 2018-07-07 (×6): 17 g via ORAL
  Filled 2018-06-30 (×6): qty 1

## 2018-06-30 MED ORDER — TAMSULOSIN HCL 0.4 MG PO CAPS
0.4000 mg | ORAL_CAPSULE | Freq: Every day | ORAL | Status: DC
  Administered 2018-07-01 – 2018-07-08 (×7): 0.4 mg via ORAL
  Filled 2018-06-30 (×8): qty 1

## 2018-06-30 MED ORDER — SODIUM CHLORIDE 0.9 % IV SOLN
1.0000 g | Freq: Two times a day (BID) | INTRAVENOUS | Status: DC
  Administered 2018-07-01: 1 g via INTRAVENOUS
  Filled 2018-06-30: qty 1

## 2018-06-30 MED ORDER — ACETAMINOPHEN 650 MG RE SUPP
650.0000 mg | Freq: Four times a day (QID) | RECTAL | Status: DC | PRN
  Administered 2018-06-30: 650 mg via RECTAL
  Filled 2018-06-30 (×2): qty 1

## 2018-06-30 MED ORDER — INSULIN ASPART 100 UNIT/ML ~~LOC~~ SOLN
0.0000 [IU] | Freq: Three times a day (TID) | SUBCUTANEOUS | Status: DC
  Administered 2018-07-01: 5 [IU] via SUBCUTANEOUS
  Administered 2018-07-01: 3 [IU] via SUBCUTANEOUS
  Administered 2018-07-01: 1 [IU] via SUBCUTANEOUS
  Administered 2018-07-02: 5 [IU] via SUBCUTANEOUS
  Administered 2018-07-02: 1 [IU] via SUBCUTANEOUS
  Administered 2018-07-02: 3 [IU] via SUBCUTANEOUS
  Administered 2018-07-03: 5 [IU] via SUBCUTANEOUS
  Administered 2018-07-03: 2 [IU] via SUBCUTANEOUS
  Administered 2018-07-03: 3 [IU] via SUBCUTANEOUS
  Administered 2018-07-04: 2 [IU] via SUBCUTANEOUS
  Administered 2018-07-04: 5 [IU] via SUBCUTANEOUS
  Administered 2018-07-04 – 2018-07-05 (×2): 2 [IU] via SUBCUTANEOUS

## 2018-06-30 MED ORDER — ASPIRIN 81 MG PO TBEC: 81.0000 mg | DELAYED_RELEASE_TABLET | Freq: Every day | ORAL | Status: DC

## 2018-06-30 MED ORDER — ALBUTEROL SULFATE (2.5 MG/3ML) 0.083% IN NEBU
2.5000 mg | INHALATION_SOLUTION | RESPIRATORY_TRACT | Status: DC | PRN
  Administered 2018-07-02 – 2018-07-05 (×2): 2.5 mg via RESPIRATORY_TRACT
  Filled 2018-06-30: qty 3

## 2018-06-30 MED ORDER — ASPIRIN EC 81 MG PO TBEC
81.0000 mg | DELAYED_RELEASE_TABLET | Freq: Every day | ORAL | Status: DC
  Administered 2018-07-01 – 2018-07-08 (×7): 81 mg via ORAL
  Filled 2018-06-30 (×8): qty 1

## 2018-06-30 MED ORDER — VANCOMYCIN HCL 10 G IV SOLR
1500.0000 mg | Freq: Once | INTRAVENOUS | Status: AC
  Administered 2018-06-30: 1500 mg via INTRAVENOUS
  Filled 2018-06-30: qty 1500

## 2018-06-30 MED ORDER — ALPRAZOLAM 0.25 MG PO TABS: 0.2500 mg | ORAL_TABLET | Freq: Two times a day (BID) | ORAL | Status: DC | PRN

## 2018-06-30 MED ORDER — ENSURE ENLIVE PO LIQD
237.0000 mL | Freq: Two times a day (BID) | ORAL | Status: DC
  Administered 2018-07-01 – 2018-07-05 (×7): 237 mL via ORAL

## 2018-06-30 MED ORDER — INSULIN ASPART 100 UNIT/ML ~~LOC~~ SOLN
0.0000 [IU] | Freq: Every day | SUBCUTANEOUS | Status: DC
  Administered 2018-07-04: 2 [IU] via SUBCUTANEOUS

## 2018-06-30 MED ORDER — VANCOMYCIN HCL 10 G IV SOLR
1250.0000 mg | INTRAVENOUS | Status: DC
  Administered 2018-07-02: 1250 mg via INTRAVENOUS
  Filled 2018-06-30: qty 1250

## 2018-06-30 MED ORDER — SODIUM CHLORIDE 0.9 % IV SOLN
INTRAVENOUS | Status: DC
  Administered 2018-07-01: 06:00:00 via INTRAVENOUS

## 2018-06-30 MED ORDER — ADULT MULTIVITAMIN W/MINERALS CH
1.0000 | ORAL_TABLET | ORAL | Status: DC
  Administered 2018-07-01 – 2018-07-08 (×8): 1 via ORAL
  Filled 2018-06-30 (×8): qty 1

## 2018-06-30 MED ORDER — ONDANSETRON HCL 4 MG PO TABS: 4.0000 mg | ORAL_TABLET | Freq: Four times a day (QID) | ORAL | Status: DC | PRN

## 2018-06-30 MED ORDER — LOPERAMIDE HCL 2 MG PO CAPS: 2.0000 mg | ORAL_CAPSULE | ORAL | Status: DC | PRN

## 2018-06-30 MED ORDER — ONDANSETRON HCL 4 MG/2ML IJ SOLN: 4.0000 mg | Freq: Four times a day (QID) | INTRAMUSCULAR | Status: DC | PRN

## 2018-06-30 MED ORDER — SODIUM CHLORIDE 0.9 % IV SOLN: 2.0000 g | Freq: Once | INTRAVENOUS | Status: DC

## 2018-06-30 MED ORDER — TRAZODONE HCL 50 MG PO TABS: 25.0000 mg | ORAL_TABLET | Freq: Every evening | ORAL | Status: DC | PRN

## 2018-06-30 MED ORDER — ACETAMINOPHEN 325 MG PO TABS
650.0000 mg | ORAL_TABLET | Freq: Four times a day (QID) | ORAL | Status: DC | PRN
  Administered 2018-07-01 – 2018-07-08 (×9): 650 mg via ORAL
  Filled 2018-06-30 (×8): qty 2

## 2018-06-30 MED ORDER — ACETAMINOPHEN 325 MG PO TABS
650.0000 mg | ORAL_TABLET | Freq: Once | ORAL | Status: DC | PRN
  Filled 2018-06-30 (×2): qty 2

## 2018-06-30 MED ORDER — VANCOMYCIN HCL IN DEXTROSE 1-5 GM/200ML-% IV SOLN: 1000.0000 mg | Freq: Once | INTRAVENOUS | Status: DC

## 2018-06-30 NOTE — ED Provider Notes (Signed)
[facility] DEPT Provider Note   CSN: [id number] Arrival date & time: 06/30/18  1236     History   Chief Complaint Chief Complaint  Patient presents with  . Blood Infection  . Fever    HPI Ricardo Watkins is a 86 y.o. male.  Patient is an 85 year old man with multiple medical problems who is been hospitalized frequently in the last 2 months with a history of myelodysplastic syndrome requiring frequent blood transfusions, chronic ITP requiring platelet boosters often, diabetes, hypertension, coronary artery disease and cardiomyopathy resulting in fluid overload easily as well as recent admissions for sepsis on multiple different antibiotics presenting today after being discharged 2 days ago with worsening cough, fever and a call from PCP yesterday that reported a positive blood culture.  Family states he was discharged on Wednesday and yesterday had a really good day with his family but was unable to sleep last night with ongoing cough and feeling poorly and an episode of vomiting this morning.  Patient was started on doxycycline yesterday for the positive blood culture of gram-positive rods.  He has received 2 doses but vomited his dose this morning.  Family states he has been much more sleepy today and has no energy.  Patient is wearing his home oxygen family states he has had a nonproductive cough.  He has had no swelling in his lower extremities and he denies any abdominal pain at this time.  The history is provided by medical records and a relative.    Past Medical History:  Diagnosis Date  . Adenomatous colon polyp   . CAD (coronary artery disease)    30% LAD Stenosis, 70% ramus intermedius stenosis, treated with PTCA and angioplasty by Dr Albertine Patricia 2004  . Cataract    right eye  . CHF (congestive heart failure) ([place])   . Chronic ITP (idiopathic thrombocytopenia) (HCC)   . Cough, persistent 11/04/2015  . Diverticulosis   . DM (diabetes mellitus) ([place])    . DVT (deep venous thrombosis) ([place])    secondary to surgery  . Dyspnea   . Fever 05/01/2018  . Hyperlipidemia   . Hypertension   . Inguinal hernia    right  . Laceration of finger of right hand 05/01/2018   INDEX FINGER  . 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 Microcytic anemia 07/07/2015  . 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 Non Hodgkin's lymphoma ([place])   . Pleural effusion, left 11/04/2015  . Prostate CA ([place]) 09/10/2011   Gleason 3+3 R, 3+4 L lobe May 2007 Rx Radioactive seed implants Dr [name]  . PVD (peripheral vascular disease) ([place])    rt renal artery stent  . Renal insufficiency   . Thrombocytopenia ([place])   . Thrombotic stroke ([place]) 09/10/2011   January 18, 2011 infarct genu & post limb R internal capsule - acute; previous lacunar infarcts/extensive white matter dis  . Thyroid nodule    left lower lobe (annual monitoring).  . Vitamin D deficiency     Patient Active Problem List   Diagnosis Date Noted  . Anxiety state   . Malnutrition of moderate degree 06/26/2018  . Lung mass   . Palliative care by specialist   . Community acquired pneumonia, bilateral   . CRF (chronic renal failure), stage 3 (moderate) ([place])   . Sepsis due to Enterococcus ([place])   . Bacteremia   . Palliative care encounter   . HCAP (healthcare-associated pneumonia) 05/28/2018  .  Goals of care, counseling/discussion 05/28/2018  . FUO (fever of unknown origin) 05/01/2018  . Unstable angina ([place])   . Chronic systolic CHF (congestive heart failure) ([place])   . Acute on chronic combined systolic and diastolic CHF (congestive heart failure) ([place]) 04/22/2018  . Nonrheumatic aortic valve stenosis   . Chest pain in adult 04/20/2018  . Chronic neutropenia ([place]) 03/02/2018  . CHF (congestive heart failure) ([place]) 02/19/2018  . Upper airway cough syndrome 10/27/2017  . Abnormal CT of the chest 07/27/2017  . Flu-like symptoms 06/02/2017  . Elevated lactic acid level  06/02/2017  . Acute kidney injury superimposed on chronic kidney disease ([place]) 06/02/2017  . CKD (chronic kidney disease), stage IV ([place]) 12/16/2016  . Bilateral carotid artery stenosis 12/11/2016  . Dyslipidemia 12/11/2016  . Anemia in chronic kidney disease 07/01/2016  . Chronic ITP (idiopathic thrombocytopenia) (HCC) 07/01/2016  . B12 deficiency anemia   . Chest tube in place   . Acute diastolic CHF (congestive heart failure) ([place]) 04/16/2016  . Acute on chronic respiratory failure with hypoxia ([place]) 04/14/2016  . Empyema lung ([place])   . Type 2 diabetes mellitus ([place]) 04/04/2016  . Empyema ([place]) 04/03/2016  . Thyroid nodule 03/03/2016  . Sepsis ([place]) 02/17/2016  . DOE (dyspnea on exertion) 01/27/2016  . Acute pulmonary edema ([place]) 11/12/2015  . Cough, persistent 11/04/2015  . Pleural effusion 11/04/2015  . Anorexia 08/08/2015  . Symptomatic anemia 07/07/2015  . Pancytopenia ([place]) 04/08/2015  . Diarrhea 04/08/2015  . Encounter for chemotherapy management 04/02/2015  . Neutropenia ([place]) 02/18/2015  . Myelodysplasia (myelodysplastic syndrome) ([place]) 11/20/2014  . Deficiency anemia 04/19/2014  . Thrombocytopenia ([place]) 04/19/2014  . Vitamin B12 deficiency 04/19/2014  . Chronic renal failure, stage 3 (moderate) ([place]) 04/19/2014  . Macrocytic anemia 03/20/2013  . Monocytosis 03/20/2013  . Thrombotic stroke ([place]) 09/10/2011  . Prostate CA ([place]) 09/10/2011  . Inguinal hernia   . Colon polyps   . CAD (coronary artery disease) 08/26/2010  . Murmur 08/26/2010  . MURMUR 08/26/2010  . History of lymphoma 08/25/2010  . THROMBOCYTOPENIA 08/25/2010  . Essential hypertension 08/25/2010  . Coronary atherosclerosis 08/25/2010  . PVD 08/25/2010    Past Surgical History:  Procedure Laterality Date  . APPENDECTOMY     patient ?  Marland Kitchen CARDIAC CATHETERIZATION    . CATARACT EXTRACTION    . CHEST TUBE INSERTION Left 02/10/2016   Procedure: INSERTION PLEURAL DRAINAGE CATHETER;  Surgeon: [name], MD;  Location: [facility];  Service: Thoracic;  Laterality: Left;  . CHEST TUBE INSERTION Left 04/08/2016   Procedure: CHEST TUBE INSERTION;  Surgeon: [name], MD;  Location: [facility];  Service: Thoracic;  Laterality: Left;  . COLONOSCOPY    . CORONARY ANGIOPLASTY    . CYSTOURETHROSCOPY     ROBOTIC ARM NUCLETRON SEED IMPLANTATION OF PROSTATE  . EXPLORATORY LAPAROTOMY     For evaluation of lymphoma  . REMOVAL OF PLEURAL DRAINAGE CATHETER Left 04/08/2016   Procedure: REMOVAL OF PLEURAL DRAINAGE CATHETER;  Surgeon: [name], MD;  Location: [facility];  Service: Thoracic;  Laterality: Left;        Home Medications    Prior to Admission medications   Medication Sig Start Date End Date Taking? Authorizing Provider  acetaminophen (TYLENOL) 500 MG tablet Take 1,000 mg by mouth every 6 (six) hours as needed for mild pain, moderate pain or headache.    Yes [provider]  ALPRAZolam (XANAX) 0.25 MG tablet Take 1 tablet (0.25 mg total)  by mouth 2 (two) times daily as needed for anxiety or sleep (agitation). 06/28/18  Yes Roxan Hockey, MD  aspirin 81 MG EC tablet Take 1 tablet (81 mg total) by mouth daily with breakfast. 06/28/18 07/28/18 Yes Emokpae, Courage, MD  diphenhydrAMINE-Phenylephrine (DELSYM NIGHT CGH/COLD CHILDREN) 6.25-2.5 MG/5ML LIQD Take 10 mLs by mouth as needed (cough related symptoms).   Yes [provider]  doxycycline (VIBRA-TABS) 100 MG tablet Take 1 tablet (100 mg total) by mouth 2 (two) times daily. 06/29/18  Yes [name], MD  feeding supplement, ENSURE ENLIVE, (ENSURE ENLIVE) LIQD Take 237 mLs by mouth 2 (two) times daily between meals. 06/12/18  Yes Lavina Hamman, MD  furosemide (LASIX) 20 MG tablet Take 20 mg by mouth daily.   Yes [provider]  gabapentin (NEURONTIN) 100 MG capsule TAKE 1 CAPSULE BY MOUTH AT BEDTIME Patient taking differently: Take 100 mg by mouth at bedtime.  03/20/18  Yes [name], MD  Insulin  Glargine (LANTUS SOLOSTAR) 100 UNIT/ML Solostar Pen Inject 15 Units into the skin at bedtime. INJECT 20 UNITS EVERY NIGHT AT BEDTIME Patient taking differently: Inject 15 Units into the skin every morning.  06/28/18  Yes Emokpae, Courage, MD  loperamide (IMODIUM) 2 MG capsule Take 2 mg by mouth as needed for diarrhea or loose stools.   Yes [provider]  meclizine (ANTIVERT) 12.5 MG tablet Take 1 tablet (12.5 mg total) by mouth 3 (three) times daily as needed for dizziness. 06/27/15  Yes [name], MD  metoprolol succinate (TOPROL-XL) 50 MG 24 hr tablet Take 1 tablet (50 mg total) by mouth daily. Take with or immediately following a meal. 06/12/18  Yes Lavina Hamman, MD  Multiple Vitamin (MULTIVITAMIN WITH MINERALS) TABS tablet Take 1 tablet by mouth every morning.    Yes [provider]  nitroGLYCERIN (NITROSTAT) 0.4 MG SL tablet Place 1 tablet (0.4 mg total) under the tongue every 5 (five) minutes as needed for chest pain. 05/03/18 05/03/19 Yes Amin, Jeanella Flattery, MD  NON FORMULARY Inject 1 Dose into the vein once a week. Platelet booster   Yes [provider]  ondansetron (ZOFRAN) 4 MG tablet Take 1 tablet (4 mg total) by mouth every 6 (six) hours as needed for nausea or vomiting. 06/28/18  Yes Emokpae, Courage, MD  OVER THE COUNTER MEDICATION Place 1 drop into both eyes daily as needed (dry eyes). Over the counter lubricating eye drop   Yes [provider]  polyethylene glycol (MIRALAX / GLYCOLAX) packet Take 17 g by mouth daily. 06/12/18  Yes Lavina Hamman, MD  pravastatin (PRAVACHOL) 40 MG tablet TAKE 1 TABLET BY MOUTH ONCE A DAY AT [place]. Patient taking differently: Take 40 mg by mouth daily. At 6 pm 06/22/18  Yes [name], MD  tamsulosin (FLOMAX) 0.4 MG CAPS capsule TAKE 1 CAPSULE BY MOUTH ONCE DAILY Patient taking differently: Take 0.4 mg by mouth daily.  04/11/18  Yes [name], MD  traMADol (ULTRAM) 50 MG tablet Take 1  tablet (50 mg total) by mouth every 6 (six) hours as needed for moderate pain. 01/03/18  Yes [name], MD    Family History Family History  Problem Relation Age of Onset  . Dementia Father   . Lymphoma Sister   . Prostate cancer Brother   . Breast cancer Sister   . Diabetes Brother   . Diabetes Sister   . Heart disease Brother   . Asthma  Son     Social History Social History   Tobacco Use  . Smoking status: Former Smoker    Packs/day: 0.50    Years: 20.00    Pack years: 10.00    Types: Pipe, Cigarettes    Last attempt to quit: 06/21/1958    Years since quitting: 60.0  . Smokeless tobacco: Never Used  Substance Use Topics  . Alcohol use: No    Alcohol/week: 0.0 standard drinks  . Drug use: No     Allergies   Glucophage [metformin hydrochloride]; Zetia [ezetimibe]; Fenofibrate; and Niacin-lovastatin er   Review of Systems Review of Systems  All other systems reviewed and are negative.    Physical Exam Updated Vital Signs BP (!) 116/59   Pulse (!) 111   Temp (!) 101.9 F (38.8 C) (Rectal)   Resp (!) 21   SpO2 96%   Physical Exam Vitals signs and nursing note reviewed.  Constitutional:      Appearance: He is well-developed. He is ill-appearing.  HENT:     Head: Normocephalic and atraumatic.     Mouth/Throat:     Mouth: Mucous membranes are dry.  Eyes:     Conjunctiva/sclera: Conjunctivae normal.     Pupils: Pupils are equal, round, and reactive to light.  Neck:     Musculoskeletal: Normal range of motion and neck supple.  Cardiovascular:     Rate and Rhythm: Regular rhythm. Tachycardia present.     Heart sounds: No murmur.  Pulmonary:     Effort: Pulmonary effort is normal. No respiratory distress.     Breath sounds: Examination of the right-lower field reveals decreased breath sounds. Examination of the left-lower field reveals decreased breath sounds. Decreased breath sounds and rhonchi present. No wheezing or rales.  Abdominal:      General: There is no distension.     Palpations: Abdomen is soft.     Tenderness: There is no abdominal tenderness. There is no guarding or rebound.  Musculoskeletal: Normal range of motion.        General: No tenderness.  Skin:    General: Skin is warm and dry.     Findings: No erythema or rash.  Neurological:     Mental Status: He is alert and oriented to person, place, and time.  Psychiatric:        Behavior: Behavior normal.      ED Treatments / Results  Labs (all labs ordered are listed, but only abnormal results are displayed) Labs Reviewed  COMPREHENSIVE METABOLIC PANEL - Abnormal; Notable for the following components:      Result Value   Potassium 5.6 (*)    Chloride 94 (*)    Glucose, Bld 178 (*)    BUN 76 (*)    Creatinine, Ser 2.36 (*)    Calcium 8.1 (*)    Albumin 2.0 (*)    Total Bilirubin 1.4 (*)    GFR calc non Af Amer 24 (*)    GFR calc Af Amer 28 (*)    All other components within normal limits  CBC WITH DIFFERENTIAL/PLATELET - Abnormal; Notable for the following components:   RBC 3.02 (*)    Hemoglobin 8.8 (*)    HCT 28.7 (*)    RDW 17.2 (*)    Platelets 125 (*)    Abs Immature Granulocytes 0.20 (*)    All other components within normal limits  PROTIME-INR - Abnormal; Notable for the following components:   Prothrombin Time 15.9 (*)    All  other components within normal limits  CULTURE, BLOOD (ROUTINE X 2)  CULTURE, BLOOD (ROUTINE X 2)  URINE CULTURE  URINALYSIS, ROUTINE W REFLEX MICROSCOPIC  I-STAT CG4 LACTIC ACID, ED  I-STAT CG4 LACTIC ACID, ED    EKG None  Radiology Dg Chest 2 View  Result Date: 06/30/2018 CLINICAL DATA:  Shortness of breath. EXAM: CHEST - 2 VIEW COMPARISON:  Radiograph June 26, 2018. FINDINGS: Stable cardiomediastinal silhouette. Atherosclerosis of thoracic aorta is noted. No pneumothorax or significant pleural effusion is noted. Left lung is unremarkable. Stable right upper lobe airspace opacity is noted most  consistent with pneumonia. Bony thorax is unremarkable IMPRESSION: Stable right upper lobe airspace opacity is noted most consistent with pneumonia. Followup PA and lateral chest X-ray is recommended in 3-4 weeks following trial of antibiotic therapy to ensure resolution and exclude underlying malignancy. Electronically Signed   By: Marijo Conception, M.D.   On: 06/30/2018 13:37    Procedures Procedures (including critical care time)  Medications Ordered in ED Medications  acetaminophen (TYLENOL) tablet 650 mg (has no administration in time range)     Initial Impression / Assessment and Plan / ED Course  I have reviewed the triage vital signs and the nursing notes.  Pertinent labs & imaging results that were available during my care of the patient were reviewed by me and considered in my medical decision making (see chart for details).    Elderly gentleman returning today after discharge 2 days ago with fever, fatigue and an episode of vomiting.  Patient had recently been treated for pneumonia but had report of gram-positive rods in his blood culture from 06/26/2017.  Patient had been covered with vancomycin and cefepime but then it had been discontinued prior to discharge.  Patient was started on doxycycline yesterday by PCP but is only taken 2 doses.  On exam patient is tachypneic, tachycardic and febrile.  Blood pressure is stable at this time. Chest x-ray shows persistent right upper lobe consolidation consistent with pneumonia that is unchanged.  CMP with persistent kidney disease that is unchanged.  Potassium of 5.6 today but may be related to hemolysis.  CBC is pending.  Lactic acid within normal limits.  Given patient's positive blood culture repeat blood cultures were drawn.  However with his fever another septic-like symptoms will start on vancomycin and cefepime and admit for further care.   Final Clinical Impressions(s) / ED Diagnoses   Final diagnoses:  Positive blood culture    Fever, unspecified fever cause    ED Discharge Orders    None       Blanchie Dessert, MD 06/30/18 1454

## 2018-06-30 NOTE — H&P (Addendum)
History and Physical    [name]:[id number] DOB: 29-Oct-1930 DOA: 06/30/2018  PCP: [name], MD  Patient coming from: home  I have personally briefly reviewed patient's old medical records in [place]  Chief Complaint: decreased energy, appetite, strength reported positive blood cultures by PCP  HPI: Ricardo Watkins is a 85 y.o. male with medical history significant of chronic combined heart failure, significant peripheral vascular disease, remote history of prostate cancer, status post renal artery stenting, chronic ITP, on chronic home O2 CAD status post stenting, recently deemed not an intervention candidate, and myelodysplastic syndrome with 5 q. deletion, who gets frequent  hemoglobin checks because of anemia.  Patient was recently discharged on January 8 after admission for acute respiratory failure with hypoxia secondary to acute pulmonary edema in the setting of acute on chronic combined heart failure exac after he received a transfusion for anemia   He represents from home today because of generalized weakness malaise and fatigue.  Family reported that patient began having worsening cough fever 2 days ago and PCP prescribed doxycycline and obtained blood cultures which returned 1 of 2+ GPR.  Family reports being progressively more sleepy over the last day so they presented to the ER.  In the ER patient was unable to provide much meaningful history but wife/daughter-in-law reported lethargy, decrease energy and inability to ambulate more than 2-3 steps with home health aide today.  As a result they brought him to the ER with hospital course as follows:  ED Course: Patient was febrile to 101.9 blood pressure 104/52 pulse of 109 respirations of 21 satting 99% on nasal cannula 4 L.  Review of microbiology shows positive blood cultures at 3 days, a white blood cell count of 4.9 BMP with a mildly elevated potassium at 5.6 and a CO2 of 30 and BUN and creatinine is 76 and 2.3  which is not far from patient's baseline.  In the ER he received Vanco and cefepime although cautious fluid administration secondary to patient's CHF and rapid decompensation with minimal additional volume.  His lactate was 1.7.  Review of Systems: As per HPI otherwise 10 point review of systems negative.    Past Medical History:  Diagnosis Date  . Adenomatous colon polyp   . CAD (coronary artery disease)    30% LAD Stenosis, 70% ramus intermedius stenosis, treated with PTCA and angioplasty by Dr Albertine Patricia 2004  . Cataract    right eye  . CHF (congestive heart failure) ([place])   . Chronic ITP (idiopathic thrombocytopenia) (H[facility])   . Cough, persistent 11/04/2015  . Diverticulosis   . DM (diabetes mellitus) ([place])   . DVT (deep venous thrombosis) ([place])    secondary to surgery  . Dyspnea   . Fever 05/01/2018  . Hyperlipidemia   . Hypertension   . Inguinal hernia    right  . Laceration of finger of right hand 05/01/2018   INDEX FINGER  . 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 Microcytic anemia 07/07/2015  . 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 Non Hodgkin's lymphoma ([place])   . Pleural effusion, left 11/04/2015  . Prostate CA ([place]) 09/10/2011   Gleason 3+3 R, 3+4 L lobe May 2007 Rx Radioactive seed implants Dr [name]  . PVD (peripheral vascular disease) ([place])    rt renal artery stent  . Renal insufficiency   . Thrombocytopenia ([place])   . Thrombotic stroke [facility]) 09/10/2011   January 18, 2011 infarct genu & post limb R internal capsule - acute; previous lacunar infarcts/extensive white matter dis  . Thyroid nodule    left lower lobe (annual monitoring).  . Vitamin D deficiency     Past Surgical History:  Procedure Laterality Date  . APPENDECTOMY     patient ?  Marland Kitchen CARDIAC CATHETERIZATION    . CATARACT EXTRACTION    . CHEST TUBE INSERTION Left 02/10/2016   Procedure: INSERTION PLEURAL DRAINAGE CATHETER;  Surgeon: [name], MD;  Location: [facility];   Service: Thoracic;  Laterality: Left;  . CHEST TUBE INSERTION Left 04/08/2016   Procedure: CHEST TUBE INSERTION;  Surgeon: [name], MD;  Location: [facility];  Service: Thoracic;  Laterality: Left;  . COLONOSCOPY    . CORONARY ANGIOPLASTY    . CYSTOURETHROSCOPY     ROBOTIC ARM NUCLETRON SEED IMPLANTATION OF PROSTATE  . EXPLORATORY LAPAROTOMY     For evaluation of lymphoma  . REMOVAL OF PLEURAL DRAINAGE CATHETER Left 04/08/2016   Procedure: REMOVAL OF PLEURAL DRAINAGE CATHETER;  Surgeon: [name], MD;  Location: [facility];  Service: Thoracic;  Laterality: Left;     reports that he quit smoking about 60 years ago. His smoking use included pipe and cigarettes. He has a 10.00 pack-year smoking history. He has never used smokeless tobacco. He reports that he does not drink alcohol or use drugs.  Allergies  Allergen Reactions  . Glucophage [Metformin Hydrochloride] Other (See Comments)    Chest pain  . Zetia [Ezetimibe] Other (See Comments)    weakness  . Fenofibrate Rash  . Niacin-Lovastatin Er Rash    Family History  Problem Relation Age of Onset  . Dementia Father   . Lymphoma Sister   . Prostate cancer Brother   . Breast cancer Sister   . Diabetes Brother   . Diabetes Sister   . Heart disease Brother   . Asthma Son      Prior to Admission medications   Medication Sig Start Date End Date Taking? Authorizing Provider  acetaminophen (TYLENOL) 500 MG tablet Take 1,000 mg by mouth every 6 (six) hours as needed for mild pain, moderate pain or headache.    Yes [provider]  ALPRAZolam (XANAX) 0.25 MG tablet Take 1 tablet (0.25 mg total) by mouth 2 (two) times daily as needed for anxiety or sleep (agitation). 06/28/18  Yes Roxan Hockey, MD  aspirin 81 MG EC tablet Take 1 tablet (81 mg total) by mouth daily with breakfast. 06/28/18 07/28/18 Yes Emokpae, Courage, MD  diphenhydrAMINE-Phenylephrine (DELSYM NIGHT CGH/COLD CHILDREN) 6.25-2.5 MG/5ML LIQD Take 10 mLs by mouth  as needed (cough related symptoms).   Yes [provider]  doxycycline (VIBRA-TABS) 100 MG tablet Take 1 tablet (100 mg total) by mouth 2 (two) times daily. 06/29/18  Yes [name], MD  feeding supplement, ENSURE ENLIVE, (ENSURE ENLIVE) LIQD Take 237 mLs by mouth 2 (two) times daily between meals. 06/12/18  Yes Lavina Hamman, MD  furosemide (LASIX) 20 MG tablet Take 20 mg by mouth daily.   Yes [provider]  gabapentin (NEURONTIN) 100 MG capsule TAKE 1 CAPSULE BY MOUTH AT BEDTIME Patient taking differently: Take 100 mg by mouth at bedtime.  03/20/18  Yes [name], MD  Insulin Glargine (LANTUS SOLOSTAR) 100 UNIT/ML Solostar Pen Inject 15 Units into the skin at bedtime. INJECT 20 UNITS EVERY NIGHT AT BEDTIME Patient taking differently: Inject 15 Units into the skin every morning.  06/28/18  Yes Emokpae, Courage, MD  loperamide (IMODIUM) 2 MG capsule Take 2 mg by mouth as needed for diarrhea or loose stools.   Yes [provider]  meclizine (ANTIVERT) 12.5 MG tablet Take 1 tablet (12.5 mg total) by mouth 3 (three) times daily as needed for dizziness. 06/27/15  Yes [name], MD  metoprolol succinate (TOPROL-XL) 50 MG 24 hr tablet Take 1 tablet (50 mg total) by mouth daily. Take with or immediately following a meal. 06/12/18  Yes Lavina Hamman, MD  Multiple Vitamin (MULTIVITAMIN WITH MINERALS) TABS tablet Take 1 tablet by mouth every morning.    Yes [provider]  nitroGLYCERIN (NITROSTAT) 0.4 MG SL tablet Place 1 tablet (0.4 mg total) under the tongue every 5 (five) minutes as needed for chest pain. 05/03/18 05/03/19 Yes Amin, Jeanella Flattery, MD  NON FORMULARY Inject 1 Dose into the vein once a week. Platelet booster   Yes [provider]  ondansetron (ZOFRAN) 4 MG tablet Take 1 tablet (4 mg total) by mouth every 6 (six) hours as needed for nausea or vomiting. 06/28/18  Yes Emokpae, Courage, MD  OVER THE COUNTER MEDICATION Place 1 drop  into both eyes daily as needed (dry eyes). Over the counter lubricating eye drop   Yes [provider]  polyethylene glycol (MIRALAX / GLYCOLAX) packet Take 17 g by mouth daily. 06/12/18  Yes Lavina Hamman, MD  pravastatin (PRAVACHOL) 40 MG tablet TAKE 1 TABLET BY MOUTH ONCE A DAY AT [place]. Patient taking differently: Take 40 mg by mouth daily. At 6 pm 06/22/18  Yes [name], MD  tamsulosin (FLOMAX) 0.4 MG CAPS capsule TAKE 1 CAPSULE BY MOUTH ONCE DAILY Patient taking differently: Take 0.4 mg by mouth daily.  04/11/18  Yes [name], MD  traMADol (ULTRAM) 50 MG tablet Take 1 tablet (50 mg total) by mouth every 6 (six) hours as needed for moderate pain. 01/03/18  Yes [name], MD    Physical Exam: Vitals:   06/30/18 1259 06/30/18 1309  BP: (!) 116/59   Pulse: (!) 111   Resp: (!) 21   Temp:  (!) 101.9 F (38.8 C)  TempSrc: Rectal Rectal  SpO2: 96%     Constitutional: NAD, calm, comfortable Vitals:   06/30/18 1259 06/30/18 1309  BP: (!) 116/59   Pulse: (!) 111   Resp: (!) 21   Temp:  (!) 101.9 F (38.8 C)  TempSrc: Rectal Rectal  SpO2: 96%    Eyes: PERRL, LIDS NL ENMT: Mucous membranes are DRY. Posterior pharynx clear of any exudate or lesions.Normal dentition.  Neck: normal, supple, no masses, no thyromegaly Respiratory: Mild rales right greater than left, trace rhonchi, no accessory muscle use Cardiovascular: Sinus tachycardia no murmur noted Abdomen: no tenderness, no masses palpated. No hepatosplenomegaly. Bowel sounds positive.  Musculoskeletal: no clubbing / cyanosis. No joint deformity upper and lower extremities. Good ROM, no contractures. Normal muscle tone.  Skin: no rashes, lesions, ulcers. No induration Neurologic: CN 2-12 grossly intact. Sensation intact, globally weak  psychiatric: No acute decompensation very limited exam secondary patient's illness    Labs on Admission: I have personally reviewed following labs  and imaging studies  CBC: Recent Labs  Lab 06/24/18 1020  06/25/18 0327 06/25/18 1422 06/26/18 0337 06/28/18 1105 06/30/18 1320  WBC 3.4*   < > 4.5 4.4 4.0 4.4 4.9  NEUTROABS 1.8  --   --   --   --   --  1.9  HGB 6.1*   < > 7.8* 8.6* 8.6* 8.6* 8.8*  HCT 20.2*   < > 25.1* 27.2* 27.6* 27.7* 28.7*  MCV 95.3   < > 91.3 90.1 89.6 92.0 95.0  PLT 110*   < > 119* 110* 96* 125* 125*   < > = values in this interval not displayed.   Basic Metabolic Panel: Recent Labs  Lab 06/25/18 0327 06/26/18 0337 06/27/18 0311 06/27/18 0744 06/28/18 0542 06/30/18 1320  NA 138 137  --  137 137 135  K 4.4 3.8  --  4.4 4.2 5.6*  CL 101 99  --  96* 95* 94*  CO2 26 26  --  30 30 30   GLUCOSE 123* 186*  --  155* 170* 178*  BUN 43* 53*  --  59* 68* 76*  CREATININE 2.10* 2.18* 2.17* 2.20* 2.24* 2.36*  CALCIUM 8.3* 8.2*  --  8.2* 8.4* 8.1*  MG  --  1.9  --   --   --   --    GFR: Estimated Creatinine Clearance: 22.9 mL/min (A) (by C-G formula based on SCr of 2.36 mg/dL (H)). Liver Function Tests: Recent Labs  Lab 06/26/18 0337 06/30/18 1320  AST 14* 26  ALT 18 22  ALKPHOS 68 62  BILITOT 0.7 1.4*  PROT 7.8 8.0  ALBUMIN 2.3* 2.0*   No results for input(s): LIPASE, AMYLASE in the last 168 hours. No results for input(s): AMMONIA in the last 168 hours. Coagulation Profile: Recent Labs  Lab 06/30/18 1320  INR 1.29   Cardiac Enzymes: No results for input(s): CKTOTAL, CKMB, CKMBINDEX, TROPONINI in the last 168 hours. BNP (last 3 results) No results for input(s): PROBNP in the last 8760 hours. HbA1C: No results for input(s): HGBA1C in the last 72 hours. CBG: Recent Labs  Lab 06/27/18 1651 06/27/18 2032 06/27/18 2333 06/28/18 0818 06/28/18 1152  GLUCAP 233* 198* 153* 159* 212*   Lipid Profile: No results for input(s): CHOL, HDL, LDLCALC, TRIG, CHOLHDL, LDLDIRECT in the last 72 hours. Thyroid Function Tests: No results for input(s): TSH, T4TOTAL, FREET4, T3FREE, THYROIDAB in the last  72 hours. Anemia Panel: No results for input(s): VITAMINB12, FOLATE, FERRITIN, TIBC, IRON, RETICCTPCT in the last 72 hours. Urine analysis:    Component Value Date/Time   COLORURINE YELLOW 06/04/2018 1705   APPEARANCEUR HAZY (A) 06/04/2018 1705   LABSPEC 1.013 06/04/2018 1705   PHURINE 5.0 06/04/2018 1705   GLUCOSEU NEGATIVE 06/04/2018 1705   HGBUR MODERATE (A) 06/04/2018 1705   BILIRUBINUR NEGATIVE 06/04/2018 1705   KETONESUR NEGATIVE 06/04/2018 1705   PROTEINUR NEGATIVE 06/04/2018 1705   NITRITE NEGATIVE 06/04/2018 1705   LEUKOCYTESUR NEGATIVE 06/04/2018 1705    Radiological Exams on Admission: Dg Chest 2 View  Result Date: 06/30/2018 CLINICAL DATA:  Shortness of breath. EXAM: CHEST - 2 VIEW COMPARISON:  Radiograph June 26, 2018. FINDINGS: Stable cardiomediastinal silhouette. Atherosclerosis of thoracic aorta is noted. No pneumothorax or significant pleural effusion is noted. Left lung is unremarkable. Stable right upper lobe airspace opacity is noted most consistent with pneumonia. Bony thorax is unremarkable IMPRESSION: Stable right upper lobe airspace opacity is noted most consistent with pneumonia. Followup PA and lateral chest X-ray is recommended in 3-4 weeks following trial of antibiotic therapy to ensure resolution and exclude underlying malignancy. Electronically Signed   By: Marijo Conception, M.D.   On: 06/30/2018 13:37    EKG: Independently reviewed.  sinus tachycardia no acute ST changes no peak T waves  Assessment/Plan Principal Problem:  Sepsis ([place]) Active Problems:   Essential hypertension   Myelodysplasia (myelodysplastic syndrome) ([facility])   Type 2 diabetes mellitus ([facility])   Chronic systolic CHF (congestive heart failure) ([place])   Bacteremia due to Gram-positive bacteria   HAP (hospital-acquired pneumonia)    1 Sepsis in the setting of hospital-acquired pneumonia and bacteremia.  Treat the patient with broad-spectrum antibiotics to include vancomycin and  cefepime, repeat blood cultures adjust antibiotics based on speciation and sensitivities, follow CBC, lactate was 1.7, chest x-ray as clinically indicated, continue with supplemental O2 at 4 L.  Patient is on chronic oxygen of 2 to 3 L at baseline patient did not receive 30 mils per kg secondary to his CHF with this stable blood pressure and low threshold for decompensating the pulmonary edema.  We will gently hydrate at 50 ml/hr and small bolus as clinically indicated   2  Acute respiratory failure with hypoxia secondary to #1 treatment as above titrate oxygen as patient clinically improves  3 myelodysplastic syndrome with 5 q. deletion leading to symptomatic microcytic anemia.  Monitor hemoglobin.  Transfuse to keep hemoglobin above 8 which he currently is.  Be aware of patient's rapid decompensation with transfusion volume  4 chronic combined CHF.  Patient is not in acute exacerbation we are gently hydrating given his sepsis with decreased p.o. intake avoid excessive hydration adding strict I's and O's and daily weights  5  type 2 diabetes.  Patient male glycemic control with sensitive scale, check blood glucose AC at bedtime.  I did continue with Lantus, holding other home medications.  6  hypertension.  Patient's blood pressure medications currently secondary to moderately soft blood pressures.  Low threshold for initiating Lasix in the morning if blood pressure response to very gentle hydration.  7 hyperkalemia 5.6  With gentle hydration we will recheck a potassium this evening 2000, EKG did not have any peaked T waves, I will not treat the patient aggressively with Kayexalate at this point given his sepsis and soft blood pressure.  No indication for calcium gluconate with no peaked T's  Pt with poor prognosis and functional status;  They met with palliative care last admission but wife was not yet ready to make pt comfort care.  Depending on clinical course family might benefit from palliative  or hospice   DVT prophylaxis: heparin Code Status: dnr Family Communication: Discussed plan of care as well as CODE STATUS with wife and daughter-in-law they are in agreement with plan and CODE STATUS Disposition Plan: To home versus hospice versus SNF to be determined by clinical course  Consults called: None Admission status: Inpatient stepdown unit   Nicolette Bang MD [facility] If 7PM-7AM, please contact night-coverage   06/30/2018, 3:17 PM

## 2018-06-30 NOTE — ED Triage Notes (Signed)
Per EMS: Pt from home.  Pt c/o of fever for several days.  Pt has CA.  Pt was D/C from hospital for sepsis a few days ago.

## 2018-06-30 NOTE — ED Notes (Addendum)
Unable to obtain all blood-work or IV access at this time.  2 RN attempt.  IV team consult ordered.

## 2018-06-30 NOTE — Progress Notes (Signed)
A consult was received from an ED physician for Vancomycin per pharmacy dosing.  The patient's profile has been reviewed for ht/wt/allergies/indication/available labs.    SCr 2.36 (near baseline)  A one time order has been placed for Vancomycin 1500mg .  Further antibiotics/pharmacy consults should be ordered by admitting physician if indicated.                       Thank you, Gretta Arab PharmD, BCPS Pager [phone] 06/30/2018 2:42 PM

## 2018-06-30 NOTE — Progress Notes (Signed)
Pharmacy Antibiotic Note  Ricardo Watkins is a 85 y.o. male admitted on 06/30/2018 with fever, acute respiratory failure, suspected pneumonia, possible bacteremia.  Pharmacy has been consulted for Vancomycin and Cefepime dosing.  SCr 2.36 (near baseline of SCr 2-2.5 while on furosemide; SCr has been lower while furosemide is held) WBC 4.9 Tm 101.9  Plan:  Cefepime 2g IV x1 then 1g IV q12h.  Vancomycin 1500 mg IV x1 dose then 1250 mg IV q48h.  Expected AUC: 487 using SCr 2.36  Check vancomycin levels if remains on vancomycin > 3-4 days.  Goal AUC 400-500.  Follow up renal fxn, culture results, and clinical course.  F/u ability to de-escalate antibiotics.      Temp (24hrs), Avg:101.9 F (38.8 C), Min:101.9 F (38.8 C), Max:101.9 F (38.8 C)  Recent Labs  Lab 06/25/18 0327 06/25/18 1422 06/26/18 0337 06/27/18 0311 06/27/18 0744 06/28/18 0542 06/28/18 1105 06/30/18 1315 06/30/18 1320  WBC 4.5 4.4 4.0  --   --   --  4.4  --  4.9  CREATININE 2.10*  --  2.18* 2.17* 2.20* 2.24*  --   --  2.36*  LATICACIDVEN  --   --   --   --   --   --   --  1.73  --     Estimated Creatinine Clearance: 22.9 mL/min (A) (by C-G formula based on SCr of 2.36 mg/dL (H)).    Allergies  Allergen Reactions  . Glucophage [Metformin Hydrochloride] Other (See Comments)    Chest pain  . Zetia [Ezetimibe] Other (See Comments)    weakness  . Fenofibrate Rash  . Niacin-Lovastatin Er Rash    Antimicrobials this admission:  (Recent abx:  1/6 Cefepime >> 1/8, 1/6 Vancomycin >> 1/7) 1/10 Vancomycin >>  1/10 Cefepime >>  Dose adjustments this admission:   Microbiology results: 1/10 BCx:  1/10 UCx:   1/10 MRSA PCR:   Previous admission: 12/14 BCx: NGF 12/15 BCx: NGF 1/4 MRSA PCR: negative 1/6 BCx: 1/4 GPR (aerobic bottle only), NG x4 d  Thank you for allowing pharmacy to be a part of this patient's care.  Gretta Arab PharmD, BCPS Pager [phone] 06/30/2018 3:35 PM

## 2018-06-30 NOTE — ED Notes (Signed)
Bed: WA03 Expected date:  Expected time:  Means of arrival:  Comments: EMS-SEPSIS

## 2018-06-30 NOTE — ED Notes (Signed)
Patient transported to X-ray 

## 2018-07-01 ENCOUNTER — Other Ambulatory Visit: Payer: Self-pay

## 2018-07-01 DIAGNOSIS — I10 Essential (primary) hypertension: Secondary | ICD-10-CM

## 2018-07-01 DIAGNOSIS — E1122 Type 2 diabetes mellitus with diabetic chronic kidney disease: Secondary | ICD-10-CM

## 2018-07-01 DIAGNOSIS — L8990 Pressure ulcer of unspecified site, unspecified stage: Secondary | ICD-10-CM

## 2018-07-01 DIAGNOSIS — A419 Sepsis, unspecified organism: Secondary | ICD-10-CM

## 2018-07-01 DIAGNOSIS — N183 Chronic kidney disease, stage 3 (moderate): Secondary | ICD-10-CM

## 2018-07-01 LAB — CULTURE, BLOOD (ROUTINE X 2)
CULTURE: NO GROWTH
Special Requests: ADEQUATE
Special Requests: ADEQUATE

## 2018-07-01 LAB — BASIC METABOLIC PANEL
Anion gap: 13 (ref 5–15)
BUN: 75 mg/dL — ABNORMAL HIGH (ref 8–23)
CO2: 25 mmol/L (ref 22–32)
Calcium: 8.2 mg/dL — ABNORMAL LOW (ref 8.9–10.3)
Chloride: 100 mmol/L (ref 98–111)
Creatinine, Ser: 2.3 mg/dL — ABNORMAL HIGH (ref 0.61–1.24)
GFR calc Af Amer: 29 mL/min — ABNORMAL LOW (ref >60)
GFR calc non Af Amer: 25 mL/min — ABNORMAL LOW (ref >60)
GLUCOSE: 134 mg/dL — AB (ref 70–99)
Potassium: 4.4 mmol/L (ref 3.5–5.1)
Sodium: 138 mmol/L (ref 135–145)

## 2018-07-01 LAB — GLUCOSE, CAPILLARY
Glucose-Capillary: 133 mg/dL — ABNORMAL HIGH (ref 70–99)
Glucose-Capillary: 272 mg/dL — ABNORMAL HIGH (ref 70–99)
Glucose-Capillary: 208 mg/dL — ABNORMAL HIGH (ref 70–99)
Glucose-Capillary: 119 mg/dL — ABNORMAL HIGH (ref 70–99)

## 2018-07-01 LAB — CBC
HCT: 27.5 % — ABNORMAL LOW (ref 39.0–52.0)
HEMOGLOBIN: 8 g/dL — AB (ref 13.0–17.0)
MCH: 27.9 pg (ref 26.0–34.0)
MCHC: 29.1 g/dL — ABNORMAL LOW (ref 30.0–36.0)
MCV: 95.8 fL (ref 80.0–100.0)
Platelets: 124 10*3/uL — ABNORMAL LOW (ref 150–400)
RBC: 2.87 MIL/uL — ABNORMAL LOW (ref 4.22–5.81)
RDW: 17 % — ABNORMAL HIGH (ref 11.5–15.5)
WBC: 5 10*3/uL (ref 4.0–10.5)
nRBC: 0 % (ref 0.0–0.2)

## 2018-07-01 LAB — POTASSIUM: Potassium: 4.4 mmol/L (ref 3.5–5.1)

## 2018-07-01 LAB — PROTIME-INR
INR: 1.43
Prothrombin Time: 17.3 seconds — ABNORMAL HIGH (ref 11.4–15.2)

## 2018-07-01 LAB — HEMOGLOBIN AND HEMATOCRIT, BLOOD
HCT: 25.6 % — ABNORMAL LOW (ref 39.0–52.0)
HEMOGLOBIN: 7.5 g/dL — AB (ref 13.0–17.0)

## 2018-07-01 LAB — CORTISOL-AM, BLOOD: Cortisol - AM: 14.2 ug/dL (ref 6.7–22.6)

## 2018-07-01 LAB — INFLUENZA PANEL BY PCR (TYPE A & B)
INFLAPCR: NEGATIVE
Influenza B By PCR: NEGATIVE

## 2018-07-01 LAB — PREPARE RBC (CROSSMATCH)

## 2018-07-01 LAB — MRSA PCR SCREENING: MRSA by PCR: NEGATIVE

## 2018-07-01 LAB — PROCALCITONIN: PROCALCITONIN: 0.39 ng/mL

## 2018-07-01 MED ORDER — FUROSEMIDE 10 MG/ML IJ SOLN
20.0000 mg | Freq: Once | INTRAMUSCULAR | Status: AC
  Administered 2018-07-02: 20 mg via INTRAVENOUS
  Filled 2018-07-01: qty 2

## 2018-07-01 MED ORDER — FLUTICASONE PROPIONATE 50 MCG/ACT NA SUSP
2.0000 | Freq: Every day | NASAL | Status: DC
  Administered 2018-07-01 – 2018-07-08 (×4): 2 via NASAL
  Filled 2018-07-01: qty 16

## 2018-07-01 MED ORDER — ALPRAZOLAM 0.25 MG PO TABS
0.2500 mg | ORAL_TABLET | Freq: Two times a day (BID) | ORAL | Status: DC | PRN
  Administered 2018-07-06: 0.25 mg via ORAL
  Filled 2018-07-01: qty 1

## 2018-07-01 MED ORDER — METOPROLOL SUCCINATE ER 25 MG PO TB24
50.0000 mg | ORAL_TABLET | Freq: Every day | ORAL | Status: DC
  Administered 2018-07-01 – 2018-07-02 (×2): 50 mg via ORAL
  Filled 2018-07-01 (×3): qty 2

## 2018-07-01 MED ORDER — SODIUM CHLORIDE 0.9 % IV SOLN
1.0000 g | INTRAVENOUS | Status: DC
  Administered 2018-07-02 – 2018-07-03 (×2): 1 g via INTRAVENOUS
  Filled 2018-07-01 (×2): qty 1

## 2018-07-01 MED ORDER — BISACODYL 10 MG RE SUPP: 10.0000 mg | Freq: Every day | RECTAL | Status: DC | PRN

## 2018-07-01 MED ORDER — BUDESONIDE 0.5 MG/2ML IN SUSP
0.5000 mg | Freq: Two times a day (BID) | RESPIRATORY_TRACT | Status: DC
  Administered 2018-07-01 – 2018-07-07 (×14): 0.5 mg via RESPIRATORY_TRACT
  Filled 2018-07-01 (×15): qty 2

## 2018-07-01 MED ORDER — DIPHENHYDRAMINE HCL 50 MG/ML IJ SOLN
INTRAMUSCULAR | Status: AC
  Filled 2018-07-01: qty 1

## 2018-07-01 MED ORDER — BISACODYL 10 MG RE SUPP: 10.0000 mg | Freq: Every day | RECTAL | Status: DC

## 2018-07-01 MED ORDER — LORATADINE 10 MG PO TABS
10.0000 mg | ORAL_TABLET | Freq: Every day | ORAL | Status: DC
  Administered 2018-07-01 – 2018-07-08 (×7): 10 mg via ORAL
  Filled 2018-07-01 (×8): qty 1

## 2018-07-01 MED ORDER — SODIUM CHLORIDE 0.9% IV SOLUTION
Freq: Once | INTRAVENOUS | Status: AC
  Administered 2018-07-02: 10 mL/h via INTRAVENOUS

## 2018-07-01 MED ORDER — IPRATROPIUM-ALBUTEROL 0.5-2.5 (3) MG/3ML IN SOLN
3.0000 mL | Freq: Two times a day (BID) | RESPIRATORY_TRACT | Status: DC
  Administered 2018-07-01 – 2018-07-07 (×12): 3 mL via RESPIRATORY_TRACT
  Filled 2018-07-01 (×14): qty 3

## 2018-07-01 MED ORDER — DIPHENHYDRAMINE HCL 25 MG PO CAPS: 25.0000 mg | ORAL_CAPSULE | Freq: Once | ORAL | Status: DC

## 2018-07-01 MED ORDER — ACETAMINOPHEN 325 MG PO TABS: 650.0000 mg | ORAL_TABLET | Freq: Once | ORAL | Status: DC

## 2018-07-01 MED ORDER — GUAIFENESIN ER 600 MG PO TB12
1200.0000 mg | ORAL_TABLET | Freq: Two times a day (BID) | ORAL | Status: DC
  Administered 2018-07-01 – 2018-07-06 (×9): 1200 mg via ORAL
  Filled 2018-07-01 (×10): qty 2

## 2018-07-01 MED ORDER — IPRATROPIUM-ALBUTEROL 0.5-2.5 (3) MG/3ML IN SOLN
3.0000 mL | Freq: Four times a day (QID) | RESPIRATORY_TRACT | Status: DC
  Administered 2018-07-01: 3 mL via RESPIRATORY_TRACT
  Filled 2018-07-01: qty 3

## 2018-07-01 MED ORDER — PRAVASTATIN SODIUM 20 MG PO TABS
40.0000 mg | ORAL_TABLET | Freq: Every day | ORAL | Status: DC
  Administered 2018-07-01 – 2018-07-08 (×7): 40 mg via ORAL
  Filled 2018-07-01 (×8): qty 2

## 2018-07-01 MED ORDER — DIPHENHYDRAMINE HCL 50 MG/ML IJ SOLN
25.0000 mg | Freq: Once | INTRAMUSCULAR | Status: AC | PRN
  Administered 2018-07-01: 25 mg via INTRAVENOUS

## 2018-07-01 NOTE — Progress Notes (Signed)
PROGRESS NOTE    Ricardo Watkins  WFU:[id number] DOB: [birth year] DOA: 06/30/2018 PCP: Ricardo Frizzle, MD    Brief Narrative: (Start on day 1 of progress note - keep it brief and live) Per Dr. Tania Ade R Watkins is a 85 y.o. male with medical history significant of chronic combined heart failure, significant peripheral vascular disease, remote history of prostate cancer, status post renal artery stenting, chronic ITP, on chronic home O2 CAD status post stenting, recently deemed not an intervention candidate, and myelodysplastic syndrome with 5 q. deletion, who gets frequent  hemoglobin checks because of anemia.  Patient was recently discharged on January 8 after admission for acute respiratory failure with hypoxia secondary to acute pulmonary edema in the setting of acute on chronic combined heart failure exac after he received a transfusion for anemia   He represents from home today because of generalized weakness malaise and fatigue.  Family reported that patient began having worsening cough fever 2 days ago and PCP prescribed doxycycline and obtained blood cultures which returned 1 of 2+ GPR.  Family reports being progressively more sleepy over the last day so they presented to the ER.  In the ER patient was unable to provide much meaningful history but wife/daughter-in-law reported lethargy, decrease energy and inability to ambulate more than 2-3 steps with home health aide today.  As a result they brought him to the ER with hospital course as follows:  ED Course: Patient was febrile to 101.9 blood pressure 104/52 pulse of 109 respirations of 21 satting 99% on nasal cannula 4 L.  Review of microbiology shows positive blood cultures at 3 days, a white blood cell count of 4.9 BMP with a mildly elevated potassium at 5.6 and a CO2 of 30 and BUN and creatinine is 76 and 2.3 which is not far from patient's baseline.  In the ER he received Vanco and cefepime although cautious fluid administration  secondary to patient's CHF and rapid decompensation with minimal additional volume.  His lactate was 1.7   Assessment & Plan:   Principal Problem:   Sepsis ([place]) Active Problems:   Essential hypertension   Myelodysplasia (myelodysplastic syndrome) (HCC)   Type 2 diabetes mellitus (HCC)   Chronic systolic CHF (congestive heart failure) (HCC)   Bacteremia due to Gram-positive bacteria   HAP (hospital-acquired pneumonia)   Pressure injury of skin  1 sepsis likely secondary to healthcare associated pneumonia plus or minus bacteremia Patient presented back to the ED with generalized weakness noted to have a fever, lactic acid level of 1.7.  Patient presented with also cough.  Chest x-ray done on admission with right upper lobe airspace opacity most consistent with pneumonia.  Patient recently hospitalized and discharged 2 days prior to admission noted to have a cough just prior to discharge.  Chest x-ray which was done at that time was negative for any new infiltrates.  Patient has been pancultured cultures pending.  During last hospitalization 1 out of 2 blood cultures were positive likely contaminant.  Urinalysis nitrite negative leukocytes negative.  Will check an influenza PCR.  Placed on droplet precaution for now pending influenza study.  Rec IV vancomycin IV cefepime.  Add Pulmicort, Flonase, Mucinex and scheduled nebs.  Gentle hydration as patient with history of chronic systolic heart failure.  Follow.  2.  Hypertension Resume home regimen of Toprol-XL.  Continue Flomax.  Follow.  Hold diuretics for today.  3.  Chronic combined systolic and diastolic heart failure Patient currently on the dry  side at this time.  Patient had presented with fever concerns for sepsis and placed on gentle hydration.  Patient noted during last hospitalization to go into flash pulmonary edema posttransfusion.  Monitor fluid status closely.  Strict I's and O's.  Daily weights.  Could likely resume Lasix in the  next 24 to 48 hours.  4.  Myelodysplastic syndrome lesion leading to symptomatic microcytic anemia Hemoglobin currently at 8.0.  Transfusion threshold hemoglobin less than 8 as patient with a history of coronary artery disease.  Hemoglobin was 8 on admission.  Decreasing hemoglobin likely dilutional related.  Repeat H&H this afternoon.  Outpatient follow-up with hematology.  Follow.  5.  Acute respiratory failure with hypoxia Secondary to problem #1.  See problem #1.  6.  Hyperkalemia Potassium noted to be 5.6 on admission.  EKG did not have any peaked T waves.  Hyperkalemia resolved potassium currently 4.4.  7.  Diabetes mellitus type 2 Hemoglobin A1c was 7.7 on 05/30/2018.  Family states patient with decreased appetite and requesting diet to be liberalized.  Change diet to a regular diet.  CBG this morning was 133.  Continue sliding scale insulin.  Home regimen of Lantus 15 units daily on hold.  Follow for now.  8.  Hyperlipidemia Continue statin.  9.  Chronic kidney disease stage IV Stable.  Avoid nephrotoxic agents.  10.  Chronic thrombocytopenia due to ITP Stable.  Outpatient follow-up with hematology.  11.  History of coronary artery disease/peripheral artery disease Currently stable.  Patient looks clinically dry.  Diuretics on hold.  Patient's Plavix was discontinued during last hospitalization as patient has not had stenting over 10 years and due to patient's chronic thrombocytopenia and anemia requiring frequent transfusions.  Continue aspirin, statin, beta-blocker.  12.  Constipation Continue MiraLAX daily.  Place on Dulcolax suppositories.  Follow.  13.  Stage I pressure injury to buttocks Wound care.  14.  Goals of care Patient noted with a poor prognosis.  Patient with history of coronary artery disease, peripheral artery disease likely not a candidate for any invasive cardiac procedures.  Patient also with a history of myelodysplastic syndrome with symptomatic  microcytic anemia and ITP requiring transfusions.  Patient was seen by palliative care during last hospitalization and family at that time was not ready for any comfort measures.  Family concerned about patient's decreased appetite and requesting diet to be liberalized.  Will change diet to a regular diet.  Will treat reversible things for now and monitor.  If patient's condition declines during this hospitalization will have palliative care reassess and meet with family for goals of care.    DVT prophylaxis: Heparin Code Status: DNR Family Communication: Updated patient, wife, daughter, granddaughter and grandson at bedside. Disposition Plan: Remain in stepdown unit   Consultants:   None  Procedures:   Chest x-ray 06/30/2018  Antimicrobials:   IV vancomycin 06/30/2018  IV cefepime 06/30/2018   Subjective: Patient laying in bed states he feels a little bit better than on admission.  Still with a cough.  Some improvement with shortness of breath.  Denies any chest pain.  No abdominal pain.  No bleeding.  Per family patient with poor appetite and wanting diet to be liberalized.  Objective: Vitals:   07/01/18 0413 07/01/18 0500 07/01/18 0800 07/01/18 0818  BP:      Pulse:      Resp:      Temp: 99 F (37.2 C)  98.6 F (37 C)   TempSrc: Axillary  Oral  SpO2:    90%  Weight:  76.4 kg    Height:        Intake/Output Summary (Last 24 hours) at 07/01/2018 0953 Last data filed at 07/01/2018 0533 Gross per 24 hour  Intake 440.56 ml  Output -  Net 440.56 ml   Filed Weights   06/30/18 2238 07/01/18 0500  Weight: 76.7 kg 76.4 kg    Examination:  General exam: Frail.  Cachectic.  Mucous members dry. Respiratory system: Clear to auscultation.  No wheezing, no crackles.  Respiratory effort normal. Cardiovascular system: Regular rate rhythm with 3/6 SEM no JVD, no gallops, no rubs.  No lower extremity edema.  Gastrointestinal system: Abdomen is nondistended, soft and nontender.  No organomegaly or masses felt. Normal bowel sounds heard. Central nervous system: Alert and oriented. No focal neurological deficits. Extremities: Symmetric 5 x 5 power. Skin: No rashes, lesions or ulcers Psychiatry: Judgement and insight appear normal. Mood & affect appropriate.     Data Reviewed: I have personally reviewed following labs and imaging studies  CBC: Recent Labs  Lab 06/24/18 1020  06/25/18 1422 06/26/18 0337 06/28/18 1105 06/30/18 1320 07/01/18 0303  WBC 3.4*   < > 4.4 4.0 4.4 4.9 5.0  NEUTROABS 1.8  --   --   --   --  1.9  --   HGB 6.1*   < > 8.6* 8.6* 8.6* 8.8* 8.0*  HCT 20.2*   < > 27.2* 27.6* 27.7* 28.7* 27.5*  MCV 95.3   < > 90.1 89.6 92.0 95.0 95.8  PLT 110*   < > 110* 96* 125* 125* 124*   < > = values in this interval not displayed.   Basic Metabolic Panel: Recent Labs  Lab 06/26/18 0337 06/27/18 0311 06/27/18 0744 06/28/18 0542 06/30/18 1320 06/30/18 1850 07/01/18 0303  NA 137  --  137 137 135  --  138  K 3.8  --  4.4 4.2 5.6* 4.4 4.4  CL 99  --  96* 95* 94*  --  100  CO2 26  --  30 30 30   --  25  GLUCOSE 186*  --  155* 170* 178*  --  134*  BUN 53*  --  59* 68* 76*  --  75*  CREATININE 2.18* 2.17* 2.20* 2.24* 2.36*  --  2.30*  CALCIUM 8.2*  --  8.2* 8.4* 8.1*  --  8.2*  MG 1.9  --   --   --   --   --   --    GFR: Estimated Creatinine Clearance: 22.7 mL/min (A) (by C-G formula based on SCr of 2.3 mg/dL (H)). Liver Function Tests: Recent Labs  Lab 06/26/18 0337 06/30/18 1320  AST 14* 26  ALT 18 22  ALKPHOS 68 62  BILITOT 0.7 1.4*  PROT 7.8 8.0  ALBUMIN 2.3* 2.0*   No results for input(s): LIPASE, AMYLASE in the last 168 hours. No results for input(s): AMMONIA in the last 168 hours. Coagulation Profile: Recent Labs  Lab 06/30/18 1320 07/01/18 0303  INR 1.29 1.43   Cardiac Enzymes: No results for input(s): CKTOTAL, CKMB, CKMBINDEX, TROPONINI in the last 168 hours. BNP (last 3 results) No results for input(s): PROBNP in the  last 8760 hours. HbA1C: No results for input(s): HGBA1C in the last 72 hours. CBG: Recent Labs  Lab 06/27/18 2333 06/28/18 0818 06/28/18 1152 06/30/18 2252 07/01/18 0807  GLUCAP 153* 159* 212* 130* 133*   Lipid Profile: No results for input(s): CHOL, HDL, LDLCALC, TRIG,  CHOLHDL, LDLDIRECT in the last 72 hours. Thyroid Function Tests: No results for input(s): TSH, T4TOTAL, FREET4, T3FREE, THYROIDAB in the last 72 hours. Anemia Panel: No results for input(s): VITAMINB12, FOLATE, FERRITIN, TIBC, IRON, RETICCTPCT in the last 72 hours. Sepsis Labs: Recent Labs  Lab 06/30/18 1315 06/30/18 1900 07/01/18 0303  PROCALCITON  --   --  0.39  LATICACIDVEN 1.73 1.16  --     Recent Results (from the past 240 hour(s))  MRSA PCR Screening     Status: None   Collection Time: 06/24/18  8:18 PM  Result Value Ref Range Status   MRSA by PCR NEGATIVE NEGATIVE Final    Comment:        The GeneXpert MRSA Assay (FDA approved for NASAL specimens only), is one component of a comprehensive MRSA colonization surveillance program. It is not intended to diagnose MRSA infection nor to guide or monitor treatment for MRSA infections. Performed at [facility], [place] [address] [place], [place] [address]   Culture, blood (Routine X 2) w Reflex to ID Panel     Status: None   Collection Time: 06/26/18  4:38 PM  Result Value Ref Range Status   Specimen Description   Final    BLOOD RIGHT HAND Performed at [facility] [address] [place], [place] [address]    Special Requests   Final    BOTTLES DRAWN AEROBIC ONLY Blood Culture adequate volume Performed at [facility] [address], [place], [place] [address]    Culture  Setup Time   Final    GRAM POSITIVE RODS AEROBIC BOTTLE ONLY CRITICAL RESULT CALLED TO, READ BACK BY AND VERIFIED WITH: RN CHRISTINA SIX 06/29/18 AT 26 AM BY CM    Culture   Final    Eggerthella  catenaformis Standardized susceptibility testing for this organism is not available. Performed at [facility], [place] [address], [place], [place] [address]    Report Status 07/01/2018 FINAL  Final  Culture, blood (Routine X 2) w Reflex to ID Panel     Status: None   Collection Time: 06/26/18  4:45 PM  Result Value Ref Range Status   Specimen Description   Final    BLOOD LEFT ANTECUBITAL Performed at [facility] [address], [place], [place] [address]    Special Requests   Final    BOTTLES DRAWN AEROBIC AND ANAEROBIC Blood Culture adequate volume Performed at [facility] [address], [place], [place] [address]    Culture   Final    NO GROWTH 5 DAYS Performed at [facility], [place] [address] [place], [place] [address]    Report Status 07/01/2018 FINAL  Final  Culture, blood (Routine x 2)     Status: None (Preliminary result)   Collection Time: 06/30/18  2:15 PM  Result Value Ref Range Status   Specimen Description   Final    BLOOD RIGHT WRIST Performed at [facility] [address] [place], [place] [address]    Special Requests   Final    BOTTLES DRAWN AEROBIC AND ANAEROBIC Blood Culture results may not be optimal due to an inadequate volume of blood received in culture bottles Performed at [facility] [address] [place], [place] [address]    Culture   Final    NO GROWTH < 24 HOURS Performed at [facility] [address] [place], [place] [address]  Report Status PENDING  Incomplete  Culture, blood (Routine x 2)     Status: None (Preliminary result)   Collection Time: 06/30/18  2:30 PM  Result Value Ref Range Status   Specimen Description   Final    BLOOD LEFT ANTECUBITAL Performed at [facility] [address] [place], [place] [address]    Special Requests   Final    BOTTLES DRAWN AEROBIC AND ANAEROBIC Blood Culture adequate  volume Performed at [facility] [address], [place], [place] [address]    Culture   Final    NO GROWTH < 24 HOURS Performed at [facility] [address] [place], [place] [address]    Report Status PENDING  Incomplete         Radiology Studies: Dg Chest 2 View  Result Date: 06/30/2018 CLINICAL DATA:  Shortness of breath. EXAM: CHEST - 2 VIEW COMPARISON:  Radiograph June 26, 2018. FINDINGS: Stable cardiomediastinal silhouette. Atherosclerosis of thoracic aorta is noted. No pneumothorax or significant pleural effusion is noted. Left lung is unremarkable. Stable right upper lobe airspace opacity is noted most consistent with pneumonia. Bony thorax is unremarkable IMPRESSION: Stable right upper lobe airspace opacity is noted most consistent with pneumonia. Followup PA and lateral chest X-ray is recommended in 3-4 weeks following trial of antibiotic therapy to ensure resolution and exclude underlying malignancy. Electronically Signed   By: Marijo Conception, M.D.   On: 06/30/2018 13:37        Scheduled Meds: . aspirin EC  81 mg Oral Daily  . budesonide (PULMICORT) nebulizer solution  0.5 mg Nebulization BID  . feeding supplement (ENSURE ENLIVE)  237 mL Oral BID BM  . fluticasone  2 spray Each Nare Daily  . gabapentin  100 mg Oral QHS  . guaiFENesin  1,200 mg Oral BID  . heparin  5,000 Units Subcutaneous Q8H  . insulin aspart  0-5 Units Subcutaneous QHS  . insulin aspart  0-9 Units Subcutaneous TID WC  . ipratropium-albuterol  3 mL Nebulization Q6H  . loratadine  10 mg Oral Daily  . multivitamin with minerals  1 tablet Oral BH-q7a  . polyethylene glycol  17 g Oral Daily  . tamsulosin  0.4 mg Oral Daily   Continuous Infusions: . sodium chloride Stopped (07/01/18 0533)  . ceFEPime (MAXIPIME) IV Stopped (07/01/18 0602)  . [START ON 07/02/2018] vancomycin       LOS: 1 day    Time spent: 40 mins    Irine Seal, MD [facility]  If 7PM-7AM, please contact night-coverage [email or web address] 07/01/2018, 9:53 AM

## 2018-07-01 NOTE — Progress Notes (Signed)
Pharmacy Antibiotic Note  Ricardo Watkins is a 85 y.o. male admitted on 06/30/2018 with fever, acute respiratory failure, suspected pneumonia, possible bacteremia.  Pharmacy has been consulted for Vancomycin and Cefepime dosing.  SCr 2.30 (near baseline of SCr 2-2.5 while on furosemide; SCr has been lower while furosemide is held) WBC 5 Tm 101.9  Plan:  Continue current vanc dosing - expected AUC 487  Change cefepime from 1g q12 to 1g q24 for CrCl 10-30 ml/min.  Expected AUC: 487 using SCr 2.36  Check vancomycin levels if remains on vancomycin > 3-4 days.  Goal AUC 400-500.  Follow up renal fxn, culture results, and clinical course.  F/u ability to de-escalate antibiotics.   Height: 5\' 8"  (172.7 cm) Weight: 168 lb 6.9 oz (76.4 kg) IBW/kg (Calculated) : 68.4  Temp (24hrs), Avg:99.2 F (37.3 C), Min:98.1 F (36.7 C), Max:101.9 F (38.8 C)  Recent Labs  Lab 06/25/18 1422 06/26/18 0337 06/27/18 0311 06/27/18 0744 06/28/18 0542 06/28/18 1105 06/30/18 1315 06/30/18 1320 06/30/18 1900 07/01/18 0303  WBC 4.4 4.0  --   --   --  4.4  --  4.9  --  5.0  CREATININE  --  2.18* 2.17* 2.20* 2.24*  --   --  2.36*  --  2.30*  LATICACIDVEN  --   --   --   --   --   --  1.73  --  1.16  --     Estimated Creatinine Clearance: 22.7 mL/min (A) (by C-G formula based on SCr of 2.3 mg/dL (H)).    Allergies  Allergen Reactions  . Glucophage [Metformin Hydrochloride] Other (See Comments)    Chest pain  . Zetia [Ezetimibe] Other (See Comments)    weakness  . Fenofibrate Rash  . Niacin-Lovastatin Er Rash    Antimicrobials this admission:  (Recent abx:  1/6 Cefepime >> 1/8, 1/6 Vancomycin >> 1/7) 1/10 Vancomycin >>  1/10 Cefepime >>  Dose adjustments this admission:   Microbiology results: 1/10 BCx:  1/10 UCx:   1/10 MRSA PCR:   Previous admission: 12/14 BCx: NGF 12/15 BCx: NGF 1/4 MRSA PCR: negative 1/6 BCx: 1/4 GPR (aerobic bottle only), NG x4 d  Thank you for allowing  pharmacy to be a part of this patient's care. Adrian Saran, PharmD, BCPS Pager [phone] 07/01/2018 12:20 PM

## 2018-07-02 LAB — GLUCOSE, CAPILLARY
GLUCOSE-CAPILLARY: 167 mg/dL — AB (ref 70–99)
Glucose-Capillary: 201 mg/dL — ABNORMAL HIGH (ref 70–99)
Glucose-Capillary: 255 mg/dL — ABNORMAL HIGH (ref 70–99)
Glucose-Capillary: 149 mg/dL — ABNORMAL HIGH (ref 70–99)

## 2018-07-02 LAB — CBC WITH DIFFERENTIAL/PLATELET
Abs Immature Granulocytes: 0.19 10*3/uL — ABNORMAL HIGH (ref 0.00–0.07)
Basophils Absolute: 0 10*3/uL (ref 0.0–0.1)
Basophils Relative: 1 %
Eosinophils Absolute: 0 10*3/uL (ref 0.0–0.5)
Eosinophils Relative: 0 %
HEMATOCRIT: 30.6 % — AB (ref 39.0–52.0)
HEMOGLOBIN: 9.2 g/dL — AB (ref 13.0–17.0)
Immature Granulocytes: 4 %
LYMPHS ABS: 1.3 10*3/uL (ref 0.7–4.0)
LYMPHS PCT: 29 %
MCH: 27.9 pg (ref 26.0–34.0)
MCHC: 30.1 g/dL (ref 30.0–36.0)
MCV: 92.7 fL (ref 80.0–100.0)
Monocytes Absolute: 0.7 10*3/uL (ref 0.1–1.0)
Monocytes Relative: 15 %
Neutro Abs: 2.2 10*3/uL (ref 1.7–7.7)
Neutrophils Relative %: 51 %
Platelets: 90 10*3/uL — ABNORMAL LOW (ref 150–400)
RBC: 3.3 MIL/uL — ABNORMAL LOW (ref 4.22–5.81)
RDW: 17.4 % — ABNORMAL HIGH (ref 11.5–15.5)
WBC: 4.3 10*3/uL (ref 4.0–10.5)
nRBC: 0 % (ref 0.0–0.2)

## 2018-07-02 LAB — URINE CULTURE: CULTURE: NO GROWTH

## 2018-07-02 LAB — BASIC METABOLIC PANEL
Anion gap: 11 (ref 5–15)
BUN: 73 mg/dL — ABNORMAL HIGH (ref 8–23)
CO2: 26 mmol/L (ref 22–32)
Calcium: 8.1 mg/dL — ABNORMAL LOW (ref 8.9–10.3)
Chloride: 101 mmol/L (ref 98–111)
Creatinine, Ser: 2.13 mg/dL — ABNORMAL HIGH (ref 0.61–1.24)
GFR calc Af Amer: 32 mL/min — ABNORMAL LOW (ref >60)
GFR calc non Af Amer: 27 mL/min — ABNORMAL LOW (ref >60)
Glucose, Bld: 219 mg/dL — ABNORMAL HIGH (ref 70–99)
Potassium: 4.6 mmol/L (ref 3.5–5.1)
Sodium: 138 mmol/L (ref 135–145)

## 2018-07-02 MED ORDER — FUROSEMIDE 20 MG PO TABS: 20.0000 mg | ORAL_TABLET | Freq: Every day | ORAL | Status: DC

## 2018-07-02 MED ORDER — INSULIN GLARGINE 100 UNIT/ML ~~LOC~~ SOLN
8.0000 [IU] | Freq: Every day | SUBCUTANEOUS | Status: DC
  Administered 2018-07-02 – 2018-07-04 (×3): 8 [IU] via SUBCUTANEOUS
  Filled 2018-07-02 (×4): qty 0.08

## 2018-07-02 MED ORDER — FUROSEMIDE 10 MG/ML IJ SOLN
20.0000 mg | Freq: Once | INTRAMUSCULAR | Status: AC
  Administered 2018-07-02: 20 mg via INTRAVENOUS
  Filled 2018-07-02: qty 2

## 2018-07-02 NOTE — Progress Notes (Signed)
Initial Nutrition Assessment  DOCUMENTATION CODES:   Non-severe (moderate) malnutrition in context of chronic illness  INTERVENTION:   Continue Ensure Enlive po BID, each supplement provides 350 kcal and 20 grams of protein  NUTRITION DIAGNOSIS:   Moderate Malnutrition related to chronic illness(MDS) as evidenced by mild fat depletion, mild muscle depletion, percent weight loss.  GOAL:   Patient will meet greater than or equal to 90% of their needs  MONITOR:   PO intake, Supplement acceptance, Labs, Weight trends, Skin, I & O's  REASON FOR ASSESSMENT:   Malnutrition Screening Tool    ASSESSMENT:   85 y.o. male with medical history significant of chronic combined heart failure, significant peripheral vascular disease, remote history of prostate cancer, status post renal artery stenting, chronic ITP, on chronic home O2 CAD status post stenting, recently deemed not an intervention candidate, and myelodysplastic syndrome with 5 q. deletion, who gets frequent  hemoglobin checks because of anemia.. Admitted for generalized weakness malaise, fatigue and worsening cough.  Patient recently assessed by nutrition team in previous admission (1/6). Pt at that time was eating ~20-55% of meals. Pt was ordered Ensure supplements and diagnosed with moderate malnutrition.  Patient is now on a liberalized diet per family request. Ensure supplements have been ordered and pt has been accepting of them this admission.   Per weight records, pt has lost 12 lb since 05/28/18 (7% wt loss x 1 month, significant for time frame). Pt is receiving Lasix, so weights may decrease more.  Medications: IV Lasix once, Multivitamin with minerals daily Labs reviewed: CBGs: 119-201 GFR: 27   NUTRITION - FOCUSED PHYSICAL EXAM:    Most Recent Value  Orbital Region  No depletion  Upper Arm Region  Mild depletion  Thoracic and Lumbar Region  Unable to assess  Buccal Region  Mild depletion  Temple Region  Mild  depletion  Clavicle Bone Region  Mild depletion  Clavicle and Acromion Bone Region  Mild depletion  Scapular Bone Region  No depletion  Dorsal Hand  No depletion  Patellar Region  Unable to assess  Anterior Thigh Region  Unable to assess  Posterior Calf Region  Unable to assess  Edema (RD Assessment)  Mild       Diet Order:   Diet Order            Diet regular Room service appropriate? Yes; Fluid consistency: Thin  Diet effective now              EDUCATION NEEDS:   No education needs have been identified at this time  Skin:  Skin Assessment: Skin Integrity Issues: Skin Integrity Issues:: Stage I Stage I: buttocks  Last BM:  1/11  Height:   Ht Readings from Last 1 Encounters:  06/30/18 5\' 8"  (1.727 m)    Weight:   Wt Readings from Last 1 Encounters:  07/02/18 77.9 kg    Ideal Body Weight:  70 kg  BMI:  Body mass index is 26.11 kg/m.  Estimated Nutritional Needs:   Kcal:  1800-2000  Protein:  85-95g  Fluid:  2L/day   Ricardo Bibles, MS, RD, LDN [facility] Dietitian Pager: [phone] After Hours Pager: [phone]

## 2018-07-02 NOTE — Evaluation (Signed)
Physical Therapy Evaluation Patient Details Name: Ricardo Watkins MRN: [id number] DOB: 1930-07-15 Today's Date: 07/02/2018   History of Present Illness  (P) 85 yo male admitted with sepsis likely secondary to healthcare associated pneumonia, recent admission, discharged on 06/28/18 with  acute respiratory failure; PMHx: CHF, DM. Hx of DM, CHF, PVD, prostate ca, CAD, MDS, NHL, DVT  Clinical Impression  Pt admitted with above diagnosis. Pt currently with functional limitations due to the deficits listed below (see PT Problem List).  Pt  Weak and deconditioned, will need SNF unless 24 hour assist at home, will follow in acute setting  Pt will benefit from skilled PT to increase their independence and safety with mobility to allow discharge to the venue listed below.       Follow Up Recommendations SNF;Supervision/Assistance - 24 hour(vs home with 24hr assist)    Equipment Recommendations  None recommended by PT    Recommendations for Other Services       Precautions / Restrictions Precautions Precautions: (P) Fall Precaution Comments: (P) monitor O2 sats/VS Restrictions Weight Bearing Restrictions: (P) No      Mobility  Bed Mobility Overal bed mobility: Needs Assistance Bed Mobility: Supine to Sit     Supine to sit: +2 for safety/equipment;+2 for physical assistance;Max assist Sit to supine: +2 for physical assistance;+2 for safety/equipment;Max assist   General bed mobility comments: assist for trunk and LEs in both directions  Transfers Overall transfer level: Needs assistance Equipment used: 2 person hand held assist Transfers: Sit to/from Stand Sit to Stand: +2 safety/equipment;+2 physical assistance;Mod assist         General transfer comment: assist for anterior-superior wt translation, unsteady on initial standing, reliant on UE support; deferred OOB as SpO2 decr to 79%, however may have been artifact  Ambulation/Gait                Stairs             Wheelchair Mobility    Modified Rankin (Stroke Patients Only)       Balance Overall balance assessment: Needs assistance Sitting-balance support: Feet supported;Bilateral upper extremity supported Sitting balance-Leahy Scale: Fair     Standing balance support: Bilateral upper extremity supported Standing balance-Leahy Scale: Poor Standing balance comment: requires UE support                              Pertinent Vitals/Pain Pain Assessment: (P) No/denies pain    Home Living Family/patient expects to be discharged to:: (P) Private residence Living Arrangements: (P) Spouse/significant other Available Help at Discharge: (P) Family;Available 24 hours/day Type of Home: (P) House Home Access: (P) Ramped entrance     Home Layout: (P) One level Home Equipment: (P) Walker - 2 wheels;Cane - single point;Shower seat      Prior Function Level of Independence: (P) Needs assistance   Gait / Transfers Assistance Needed: (P) cane vs RW  ADL's / Homemaking Assistance Needed: (P) wife assists with bathing, dressing  Comments: (P) difficulty amb more than 2 - 3 steps with HH aide prior to readmission     Hand Dominance   Dominant Hand: (P) Right    Extremity/Trunk Assessment   Upper Extremity Assessment Upper Extremity Assessment: (P) Generalized weakness    Lower Extremity Assessment Lower Extremity Assessment: (P) Generalized weakness    Cervical / Trunk Assessment Cervical / Trunk Assessment: (P) Kyphotic  Communication   Communication: (P) HOH  Cognition Arousal/Alertness: (P) Awake/alert  Behavior During Therapy: (P) WFL for tasks assessed/performed Overall Cognitive Status: (P) Within Functional Limits for tasks assessed                               Problem Solving: (P) Slow processing;Requires verbal cues;Requires tactile cues        General Comments General comments (skin integrity, edema, etc.): (P) mild BLE and scrotal  swelling    Exercises     Assessment/Plan    PT Assessment Patient needs continued PT services  PT Problem List Decreased strength;Decreased balance;Decreased mobility;Decreased activity tolerance;Decreased knowledge of use of DME;Cardiopulmonary status limiting activity       PT Treatment Interventions DME instruction;Gait training;Functional mobility training;Therapeutic activities;Balance training;Patient/family education;Therapeutic exercise    PT Goals (Current goals can be found in the Care Plan section)  Acute Rehab PT Goals Patient Stated Goal: home PT Goal Formulation: With patient/family Time For Goal Achievement: 07/16/18 Potential to Achieve Goals: Fair    Frequency Min 2X/week   Barriers to discharge        Co-evaluation PT/OT/SLP Co-Evaluation/Treatment: Yes Reason for Co-Treatment: For patient/therapist safety PT goals addressed during session: Mobility/safety with mobility OT goals addressed during session: (P) ADL's and self-care       AM-PAC PT "6 Clicks" Mobility  Outcome Measure Help needed turning from your back to your side while in a flat bed without using bedrails?: A Lot Help needed moving from lying on your back to sitting on the side of a flat bed without using bedrails?: A Lot Help needed moving to and from a bed to a chair (including a wheelchair)?: A Lot Help needed standing up from a chair using your arms (e.g., wheelchair or bedside chair)?: Total Help needed to walk in hospital room?: Total Help needed climbing 3-5 steps with a railing? : Total 6 Click Score: 9    End of Session Equipment Utilized During Treatment: Oxygen Activity Tolerance: Patient tolerated treatment well;Patient limited by fatigue Patient left: with call bell/phone within reach;with family/visitor present;in bed;with bed alarm set Nurse Communication: Mobility status PT Visit Diagnosis: Muscle weakness (generalized) (M62.81);Difficulty in walking, not elsewhere  classified (R26.2);Unsteadiness on feet (R26.81)    Time: 8242-3536 PT Time Calculation (min) (ACUTE ONLY): 16 min   Charges:   PT Evaluation $PT Eval Low Complexity: 1 Low          Kenyon Ana, PT  Pager: [phone] Acute Rehab Dept [facility]): [phone]   07/02/2018   [facility] 07/02/2018, 12:32 PM

## 2018-07-02 NOTE — Progress Notes (Signed)
PROGRESS NOTE    Ricardo ALA  Watkins:[id number] DOB: 1930/11/24 DOA: 06/30/2018 PCP: [name], MD    Brief Narrative: (Start on day 1 of progress note - keep it brief and live) Per Dr. Tania Ade R Bryner is a 85 y.o. male with medical history significant of chronic combined heart failure, significant peripheral vascular disease, remote history of prostate cancer, status post renal artery stenting, chronic ITP, on chronic home O2 CAD status post stenting, recently deemed not an intervention candidate, and myelodysplastic syndrome with 5 q. deletion, who gets frequent  hemoglobin checks because of anemia.  Patient was recently discharged on January 8 after admission for acute respiratory failure with hypoxia secondary to acute pulmonary edema in the setting of acute on chronic combined heart failure exac after he received a transfusion for anemia   He represents from home today because of generalized weakness malaise and fatigue.  Family reported that patient began having worsening cough fever 2 days ago and PCP prescribed doxycycline and obtained blood cultures which returned 1 of 2+ GPR.  Family reports being progressively more sleepy over the last day so they presented to the ER.  In the ER patient was unable to provide much meaningful history but wife/daughter-in-law reported lethargy, decrease energy and inability to ambulate more than 2-3 steps with home health aide today.  As a result they brought him to the ER with hospital course as follows:  ED Course: Patient was febrile to 101.9 blood pressure 104/52 pulse of 109 respirations of 21 satting 99% on nasal cannula 4 L.  Review of microbiology shows positive blood cultures at 3 days, a white blood cell count of 4.9 BMP with a mildly elevated potassium at 5.6 and a CO2 of 30 and BUN and creatinine is 76 and 2.3 which is not far from patient's baseline.  In the ER he received Vanco and cefepime although cautious fluid administration  secondary to patient's CHF and rapid decompensation with minimal additional volume.  His lactate was 1.7   Assessment & Plan:   Principal Problem:   Sepsis ([place]) Active Problems:   Essential hypertension   Myelodysplasia (myelodysplastic syndrome) (HCC)   Type 2 diabetes mellitus (HCC)   Chronic systolic CHF (congestive heart failure) (HCC)   Bacteremia due to Gram-positive bacteria   HAP (hospital-acquired pneumonia)   Pressure injury of skin  1 sepsis likely secondary to healthcare associated pneumonia plus or minus bacteremia Patient presented back to the ED with generalized weakness noted to have a fever, lactic acid level of 1.7.  Patient presented with also cough.  Chest x-ray done on admission with right upper lobe airspace opacity most consistent with pneumonia.  Patient recently hospitalized and discharged 2 days prior to admission noted to have a cough just prior to discharge.  Chest x-ray which was done at that time was negative for any new infiltrates.  Patient has been pancultured cultures pending.  During last hospitalization 1 out of 2 blood cultures were positive likely contaminant.  Urinalysis nitrite negative leukocytes negative.  Influenza PCR negative.  Patient noted to have a T-max of 103.3 at 6 PM on 07/01/2018.  Continue IV vancomycin IV cefepime, Pulmicort, Flonase, Mucinex, scheduled nebs.  IV fluids have been saline locked.  If blood cultures remain negative tomorrow we will discontinue IV vancomycin.  Supportive care.  Follow.  2.  Hypertension Continue Toprol-XL, Flomax.  Resume home dose Lasix tomorrow.  3.  Chronic combined systolic and diastolic heart failure Patient noted  to be on the dry side on admission.  Patient had presented with fever concerns for sepsis and placed on gentle hydration.  Patient noted during last hospitalization to go into flash pulmonary edema posttransfusion.  Monitor fluid status closely.  Strict I's and O's.  Daily weights.  Status post  2 units packed red blood cells.  Patient received Lasix 20 mg in between units as well as Lasix 20 mg IV after second unit of packed red blood cells.  Patient with some scattered crackles noted on examination.  We will give a dose of Lasix 20 mg IV x1 at 4 PM.  Resume home regimen oral Lasix tomorrow 07/03/2018.   4.  Myelodysplastic syndrome lesion leading to symptomatic microcytic anemia Hemoglobin had dropped to 7.5 on 07/01/2018 at 4 PM.  Patient denied any overt bleeding.  Status post 2 units packed red blood cells.  Posttransfusion CBC pending. Transfusion threshold hemoglobin less than 8 as patient with a history of coronary artery disease.  Decreasing hemoglobin likely dilutional related. Outpatient follow-up with hematology.  Follow.  5.  Acute respiratory failure with hypoxia Secondary to problem #1.  See problem #1.  6.  Hyperkalemia Potassium noted to be 5.6 on admission.  EKG did not have any peaked T waves.  Hyperkalemia resolved potassium currently 4.4 as of 07/01/2018.  Labs pending..  7.  Diabetes mellitus type 2 Hemoglobin A1c was 7.7 on 05/30/2018.  Family states patient with decreased appetite and requesting diet to be liberalized.  Diet has been changed to regular diet.  Per family patient with improving appetite with change in diet. Continue sliding scale insulin.  Start patient back on half home dose Lantus at 8 units daily and uptitrate as needed.  Follow.  8.  Hyperlipidemia Continue statin.  9.  Chronic kidney disease stage IV Stable.  Avoid nephrotoxic agents.  10.  Chronic thrombocytopenia due to ITP Labs pending.  Outpatient follow-up with hematology.  11.  History of coronary artery disease/peripheral artery disease Currently stable.  Patient looks clinically dry.  Diuretics were held on admission. Patient's Plavix was discontinued during last hospitalization as patient has not had stenting over 10 years and due to patient's chronic thrombocytopenia and anemia  requiring frequent transfusions.  Continue aspirin, statin, beta-blocker.  Resume home dose Lasix tomorrow.  12.  Constipation Continue daily MiraLAX.  Change Dulcolax suppository to as needed.  Follow.  13.  Stage I pressure injury to buttocks Wound care.  14.  Goals of care Patient noted with a poor prognosis.  Patient with history of coronary artery disease, peripheral artery disease likely not a candidate for any invasive cardiac procedures.  Patient also with a history of myelodysplastic syndrome with symptomatic microcytic anemia and ITP requiring transfusions.  Patient was seen by palliative care during last hospitalization and family at that time was not ready for any comfort measures.  Family concerned about patient's decreased appetite and requesting diet to be liberalized.  Patient's appetite seems to be improving on liberalize diet which we will continue for now. Will treat reversible things for now and monitor.  If patient's condition declines during this hospitalization will have palliative care reassess and meet with family for goals of care.    DVT prophylaxis: Heparin Code Status: DNR Family Communication: Updated patient, and grandchildren at bedside.  Disposition Plan: Remain in stepdown unit today.  If continued improvement hopefully transfer to telemetry tomorrow.   Consultants:   None  Procedures:   Chest x-ray 06/30/2018  2 units  packed red blood cells 07/01/2018-07/02/2018  Antimicrobials:   IV vancomycin 06/30/2018  IV cefepime 06/30/2018   Subjective: Patient sleeping however easily arousable.  Grandchildren around bedside states patient's appetite improving.  Patient noted to have a fever yesterday evening of 103.3 at 6 PM.  Patient just finished transfusion of 2 units packed red blood cells.  Patient denies any chest pain.  Patient does endorse coughing.  Some shortness of breath.   Objective: Vitals:   07/02/18 0500 07/02/18 0530 07/02/18 0600 07/02/18  0800  BP: (!) 126/55 (!) 124/56 129/65 (!) 123/58  Pulse: 99  (!) 101 99  Resp: (!) 24  (!) 29 (!) 31  Temp:  97.8 F (36.6 C)  98.8 F (37.1 C)  TempSrc:  Oral  Axillary  SpO2: 100%  100% 100%  Weight: 77.9 kg     Height:        Intake/Output Summary (Last 24 hours) at 07/02/2018 0923 Last data filed at 07/02/2018 0600 Gross per 24 hour  Intake 394.21 ml  Output 450 ml  Net -55.79 ml   Filed Weights   06/30/18 2238 07/01/18 0500 07/02/18 0500  Weight: 76.7 kg 76.4 kg 77.9 kg    Examination:  General exam: Frail.  Cachectic.  Mucous members dry. Respiratory system: CTAB.  Some diffuse scattered crackles.  Some coarse breath sounds.  No wheezing.  Speaking in full sentences.  Respiratory effort normal. Cardiovascular system: Regular rate rhythm with 3/6 SEM no JVD, no gallops, no rubs.  No lower extremity edema.  Gastrointestinal system: Abdomen is soft, nontender, nondistended, positive bowel sounds.  No rebound.  No guarding.  Central nervous system: Alert and oriented. No focal neurological deficits. Extremities: Symmetric 5 x 5 power. Skin: No rashes, lesions or ulcers Psychiatry: Judgement and insight appear normal. Mood & affect appropriate.     Data Reviewed: I have personally reviewed following labs and imaging studies  CBC: Recent Labs  Lab 06/25/18 1422 06/26/18 0337 06/28/18 1105 06/30/18 1320 07/01/18 0303 07/01/18 1604  WBC 4.4 4.0 4.4 4.9 5.0  --   NEUTROABS  --   --   --  1.9  --   --   HGB 8.6* 8.6* 8.6* 8.8* 8.0* 7.5*  HCT 27.2* 27.6* 27.7* 28.7* 27.5* 25.6*  MCV 90.1 89.6 92.0 95.0 95.8  --   PLT 110* 96* 125* 125* 124*  --    Basic Metabolic Panel: Recent Labs  Lab 06/26/18 0337 06/27/18 0311 06/27/18 0744 06/28/18 0542 06/30/18 1320 06/30/18 1850 07/01/18 0303  NA 137  --  137 137 135  --  138  K 3.8  --  4.4 4.2 5.6* 4.4 4.4  CL 99  --  96* 95* 94*  --  100  CO2 26  --  30 30 30   --  25  GLUCOSE 186*  --  155* 170* 178*  --  134*   BUN 53*  --  59* 68* 76*  --  75*  CREATININE 2.18* 2.17* 2.20* 2.24* 2.36*  --  2.30*  CALCIUM 8.2*  --  8.2* 8.4* 8.1*  --  8.2*  MG 1.9  --   --   --   --   --   --    GFR: Estimated Creatinine Clearance: 22.7 mL/min (A) (by C-G formula based on SCr of 2.3 mg/dL (H)). Liver Function Tests: Recent Labs  Lab 06/26/18 0337 06/30/18 1320  AST 14* 26  ALT 18 22  ALKPHOS 68 62  BILITOT 0.7 1.4*  PROT 7.8 8.0  ALBUMIN 2.3* 2.0*   No results for input(s): LIPASE, AMYLASE in the last 168 hours. No results for input(s): AMMONIA in the last 168 hours. Coagulation Profile: Recent Labs  Lab 06/30/18 1320 07/01/18 0303  INR 1.29 1.43   Cardiac Enzymes: No results for input(s): CKTOTAL, CKMB, CKMBINDEX, TROPONINI in the last 168 hours. BNP (last 3 results) No results for input(s): PROBNP in the last 8760 hours. HbA1C: No results for input(s): HGBA1C in the last 72 hours. CBG: Recent Labs  Lab 07/01/18 0807 07/01/18 1220 07/01/18 1739 07/01/18 2214 07/02/18 0755  GLUCAP 133* 272* 208* 119* 201*   Lipid Profile: No results for input(s): CHOL, HDL, LDLCALC, TRIG, CHOLHDL, LDLDIRECT in the last 72 hours. Thyroid Function Tests: No results for input(s): TSH, T4TOTAL, FREET4, T3FREE, THYROIDAB in the last 72 hours. Anemia Panel: No results for input(s): VITAMINB12, FOLATE, FERRITIN, TIBC, IRON, RETICCTPCT in the last 72 hours. Sepsis Labs: Recent Labs  Lab 06/30/18 1315 06/30/18 1900 07/01/18 0303  PROCALCITON  --   --  0.39  LATICACIDVEN 1.73 1.16  --     Recent Results (from the past 240 hour(s))  MRSA PCR Screening     Status: None   Collection Time: 06/24/18  8:18 PM  Result Value Ref Range Status   MRSA by PCR NEGATIVE NEGATIVE Final    Comment:        The GeneXpert MRSA Assay (FDA approved for NASAL specimens only), is one component of a comprehensive MRSA colonization surveillance program. It is not intended to diagnose MRSA infection nor to guide  or monitor treatment for MRSA infections. Performed at [facility], [place] [address], [place], [place] [address]   Culture, blood (Routine X 2) w Reflex to ID Panel     Status: None   Collection Time: 06/26/18  4:38 PM  Result Value Ref Range Status   Specimen Description   Final    BLOOD RIGHT HAND Performed at [place] [address], [place], [place] [address]    Special Requests   Final    BOTTLES DRAWN AEROBIC ONLY Blood Culture adequate volume Performed at [place] [address] [place], [place] [address]    Culture  Setup Time   Final    GRAM POSITIVE RODS AEROBIC BOTTLE ONLY CRITICAL RESULT CALLED TO, READ BACK BY AND VERIFIED WITH: RN CHRISTINA SIX 06/29/18 AT 33 AM BY CM    Culture   Final    Eggerthella catenaformis Standardized susceptibility testing for this organism is not available. Performed at [facility], [place] [address] [place], [place] [address]    Report Status 07/01/2018 FINAL  Final  Culture, blood (Routine X 2) w Reflex to ID Panel     Status: None   Collection Time: 06/26/18  4:45 PM  Result Value Ref Range Status   Specimen Description   Final    BLOOD LEFT ANTECUBITAL Performed at [place] [address], [place], [place] [address]    Special Requests   Final    BOTTLES DRAWN AEROBIC AND ANAEROBIC Blood Culture adequate volume Performed at [place] [address] [place], [place] [address]    Culture   Final    NO GROWTH 5 DAYS Performed at [facility], [place] [address] [place], [place] [address]    Report Status 07/01/2018 FINAL  Final  Culture, blood (Routine x 2)  Status: None (Preliminary result)   Collection Time: 06/30/18  2:15 PM  Result Value Ref Range Status   Specimen Description   Final    BLOOD RIGHT WRIST Performed at [place] [address] [place], [place] [address]     Special Requests   Final    BOTTLES DRAWN AEROBIC AND ANAEROBIC Blood Culture results may not be optimal due to an inadequate volume of blood received in culture bottles Performed at [place] [address], [place], [place] [address]    Culture   Final    NO GROWTH 2 DAYS Performed at [place] [address] [place], [place] [address]    Report Status PENDING  Incomplete  Culture, blood (Routine x 2)     Status: None (Preliminary result)   Collection Time: 06/30/18  2:30 PM  Result Value Ref Range Status   Specimen Description   Final    BLOOD LEFT ANTECUBITAL Performed at [place] [address], [place], [place] [address]    Special Requests   Final    BOTTLES DRAWN AEROBIC AND ANAEROBIC Blood Culture adequate volume Performed at [place] [address], [place], [place] [address]    Culture   Final    NO GROWTH 2 DAYS Performed at [place] [address] [place], [place] [address]    Report Status PENDING  Incomplete  Urine culture     Status: None   Collection Time: 06/30/18  5:52 PM  Result Value Ref Range Status   Specimen Description URINE, CLEAN CATCH  Final   Special Requests   Final    Immunocompromised Performed at [place] [address] [place], [place] [address]    Culture NO GROWTH  Final   Report Status 07/02/2018 FINAL  Final  MRSA PCR Screening     Status: None   Collection Time: 07/01/18  9:15 AM  Result Value Ref Range Status   MRSA by PCR NEGATIVE NEGATIVE Final    Comment:        The GeneXpert MRSA Assay (FDA approved for NASAL specimens only), is one component of a comprehensive MRSA colonization surveillance program. It is not intended to diagnose MRSA infection nor to guide or monitor treatment for MRSA infections. Performed at [facility], [place] [address] [place], [place] [address]           Radiology Studies: Dg Chest 2 View  Result Date: 06/30/2018 CLINICAL DATA:  Shortness of breath. EXAM: CHEST - 2 VIEW COMPARISON:  Radiograph June 26, 2018. FINDINGS: Stable cardiomediastinal silhouette. Atherosclerosis of thoracic aorta is noted. No pneumothorax or significant pleural effusion is noted. Left lung is unremarkable. Stable right upper lobe airspace opacity is noted most consistent with pneumonia. Bony thorax is unremarkable IMPRESSION: Stable right upper lobe airspace opacity is noted most consistent with pneumonia. Followup PA and lateral chest X-ray is recommended in 3-4 weeks following trial of antibiotic therapy to ensure resolution and exclude underlying malignancy. Electronically Signed   By: Marijo Conception, M.D.   On: 06/30/2018 13:37        Scheduled Meds: . sodium chloride   Intravenous Once  . acetaminophen  650 mg Oral Once  . aspirin EC  81 mg Oral Daily  . budesonide (PULMICORT) nebulizer solution  0.5 mg Nebulization BID  . diphenhydrAMINE  25 mg Oral Once  . feeding supplement (ENSURE ENLIVE)  237  mL Oral BID BM  . fluticasone  2 spray Each Nare Daily  . [START ON 07/03/2018] furosemide  20 mg Oral Daily  . gabapentin  100 mg Oral QHS  . guaiFENesin  1,200 mg Oral BID  . heparin  5,000 Units Subcutaneous Q8H  . insulin aspart  0-5 Units Subcutaneous QHS  . insulin aspart  0-9 Units Subcutaneous TID WC  . ipratropium-albuterol  3 mL Nebulization BID  . loratadine  10 mg Oral Daily  . metoprolol succinate  50 mg Oral Daily  . multivitamin with minerals  1 tablet Oral BH-q7a  . polyethylene glycol  17 g Oral Daily  . pravastatin  40 mg Oral Daily  . tamsulosin  0.4 mg Oral Daily   Continuous Infusions: . ceFEPime (MAXIPIME) IV 1 g (07/02/18 0551)  . vancomycin       LOS: 2 days    Time spent: 40 mins    Irine Seal, MD [facility]  If 7PM-7AM, please contact night-coverage [email or web address] 07/02/2018, 9:23 AM

## 2018-07-02 NOTE — Progress Notes (Signed)
Occupational Therapy Evaluation Patient Details Name: Ricardo Watkins MRN: [id number] DOB: Apr 20, 1931 Today's Date: 07/02/2018    History of Present Illness 85 yo male admitted with sepsis likely secondary to healthcare associated pneumonia, recent admission, discharged on 06/28/18 with  acute respiratory failure; PMHx: CHF, DM. Hx of DM, CHF, PVD, prostate ca, CAD, MDS, NHL, DVT   Clinical Impression   Patient presents to OT with decreased ADL independence and safety. Will benefit from skilled OT to maximize function and to facilitate a safe discharge. OT will follow.    Follow Up Recommendations  SNF;Supervision/Assistance - 24 hour    Equipment Recommendations  3 in 1 bedside commode    Recommendations for Other Services PT consult     Precautions / Restrictions Precautions Precautions: Fall Precaution Comments: monitor O2 sats/VS Restrictions Weight Bearing Restrictions: No      Mobility Bed Mobility Overal bed mobility: Needs Assistance Bed Mobility: Supine to Sit     Supine to sit: +2 for safety/equipment;+2 for physical assistance;Max assist Sit to supine: +2 for physical assistance;+2 for safety/equipment;Max assist   General bed mobility comments: assist for trunk and LEs in both directions  Transfers Overall transfer level: Needs assistance Equipment used: 2 person hand held assist Transfers: Sit to/from Stand Sit to Stand: +2 safety/equipment;+2 physical assistance;Mod assist         General transfer comment: assist for anterior-superior wt translation, unsteady on initial standing, reliant on UE support; deferred OOB as SpO2 decr to 79%, however may have been artifact    Balance Overall balance assessment: Needs assistance Sitting-balance support: Feet supported;Bilateral upper extremity supported Sitting balance-Leahy Scale: Fair     Standing balance support: Bilateral upper extremity supported Standing balance-Leahy Scale: Poor Standing balance  comment: requires UE support                            ADL either performed or assessed with clinical judgement   ADL Overall ADL's : Needs assistance/impaired                         Toilet Transfer: Total assistance Toilet Transfer Details (indicate cue type and reason): hold urinal in place in sitting and standing Toileting- Clothing Manipulation and Hygiene: Total assistance;Bed level         General ADL Comments: Patient attempted urinal use in sitting and standing. Not successful.     Vision         Perception     Praxis      Pertinent Vitals/Pain Pain Assessment: No/denies pain     Hand Dominance Right   Extremity/Trunk Assessment Upper Extremity Assessment Upper Extremity Assessment: Generalized weakness   Lower Extremity Assessment Lower Extremity Assessment: Generalized weakness   Cervical / Trunk Assessment Cervical / Trunk Assessment: Kyphotic   Communication Communication Communication: HOH   Cognition Arousal/Alertness: Awake/alert Behavior During Therapy: WFL for tasks assessed/performed Overall Cognitive Status: Within Functional Limits for tasks assessed                               Problem Solving: Slow processing;Requires verbal cues;Requires tactile cues     General Comments  mild BLE and scrotal swelling    Exercises     Shoulder Instructions      Home Living Family/patient expects to be discharged to:: Private residence Living Arrangements: Spouse/significant other Available Help at Discharge: Family;Available  24 hours/day Type of Home: House Home Access: Ramped entrance     Home Layout: One level     Bathroom Shower/Tub: Occupational psychologist: Handicapped height     Home Equipment: Environmental consultant - 2 wheels;Cane - single point;Shower seat          Prior Functioning/Environment Level of Independence: Needs assistance  Gait / Transfers Assistance Needed: cane vs RW ADL's /  Homemaking Assistance Needed: wife assists with bathing, dressing   Comments: difficulty amb more than 2 - 3 steps with [place] aide prior to readmission        OT Problem List: Decreased strength;Decreased activity tolerance;Impaired balance (sitting and/or standing);Decreased cognition;Decreased safety awareness;Decreased knowledge of use of DME or AE      OT Treatment/Interventions: Self-care/ADL training;DME and/or AE instruction;Patient/family education;Balance training;Therapeutic activities    OT Goals(Current goals can be found in the care plan section) Acute Rehab OT Goals Patient Stated Goal: home OT Goal Formulation: With patient Time For Goal Achievement: 07/16/18 Potential to Achieve Goals: Fair  OT Frequency: Min 2X/week   Barriers to D/C:            Co-evaluation PT/OT/SLP Co-Evaluation/Treatment: Yes Reason for Co-Treatment: For patient/therapist safety PT goals addressed during session: Mobility/safety with mobility OT goals addressed during session: ADL's and self-care      AM-PAC OT "6 Clicks" Daily Activity     Outcome Measure Help from another person eating meals?: Total Help from another person taking care of personal grooming?: Total Help from another person toileting, which includes using toliet, bedpan, or urinal?: Total Help from another person bathing (including washing, rinsing, drying)?: Total Help from another person to put on and taking off regular upper body clothing?: Total Help from another person to put on and taking off regular lower body clothing?: Total 6 Click Score: 6   End of Session Equipment Utilized During Treatment: Oxygen Nurse Communication: Mobility status(nurse present during mobility)  Activity Tolerance: Patient tolerated treatment well Patient left: in bed;with call bell/phone within reach;with nursing/sitter in room  OT Visit Diagnosis: Unsteadiness on feet (R26.81)                Time: 7672-0947 OT Time Calculation  (min): 17 min Charges:  OT General Charges $OT Visit: 1 Visit OT Evaluation $OT Eval Moderate Complexity: 1 Mod   Cortney Beissel A Loveta Dellis 07/02/2018, 12:32 PM

## 2018-07-03 ENCOUNTER — Ambulatory Visit: Payer: Self-pay | Admitting: Family Medicine

## 2018-07-03 DIAGNOSIS — I5022 Chronic systolic (congestive) heart failure: Secondary | ICD-10-CM

## 2018-07-03 DIAGNOSIS — Z87891 Personal history of nicotine dependence: Secondary | ICD-10-CM

## 2018-07-03 DIAGNOSIS — Z888 Allergy status to other drugs, medicaments and biological substances status: Secondary | ICD-10-CM

## 2018-07-03 DIAGNOSIS — E119 Type 2 diabetes mellitus without complications: Secondary | ICD-10-CM

## 2018-07-03 DIAGNOSIS — Z8701 Personal history of pneumonia (recurrent): Secondary | ICD-10-CM

## 2018-07-03 DIAGNOSIS — I110 Hypertensive heart disease with heart failure: Secondary | ICD-10-CM

## 2018-07-03 DIAGNOSIS — D638 Anemia in other chronic diseases classified elsewhere: Secondary | ICD-10-CM

## 2018-07-03 LAB — BASIC METABOLIC PANEL
Anion gap: 10 (ref 5–15)
BUN: 67 mg/dL — ABNORMAL HIGH (ref 8–23)
CHLORIDE: 103 mmol/L (ref 98–111)
CO2: 28 mmol/L (ref 22–32)
Calcium: 8.3 mg/dL — ABNORMAL LOW (ref 8.9–10.3)
Creatinine, Ser: 2.18 mg/dL — ABNORMAL HIGH (ref 0.61–1.24)
GFR calc Af Amer: 31 mL/min — ABNORMAL LOW (ref >60)
GFR calc non Af Amer: 27 mL/min — ABNORMAL LOW (ref >60)
Glucose, Bld: 172 mg/dL — ABNORMAL HIGH (ref 70–99)
POTASSIUM: 4.6 mmol/L (ref 3.5–5.1)
Sodium: 141 mmol/L (ref 135–145)

## 2018-07-03 LAB — CBC WITH DIFFERENTIAL/PLATELET
ABS IMMATURE GRANULOCYTES: 0.27 10*3/uL — AB (ref 0.00–0.07)
Basophils Absolute: 0 10*3/uL (ref 0.0–0.1)
Basophils Relative: 0 %
Eosinophils Absolute: 0 10*3/uL (ref 0.0–0.5)
Eosinophils Relative: 0 %
HCT: 30.1 % — ABNORMAL LOW (ref 39.0–52.0)
HEMOGLOBIN: 9 g/dL — AB (ref 13.0–17.0)
Immature Granulocytes: 5 %
Lymphocytes Relative: 29 %
Lymphs Abs: 1.5 10*3/uL (ref 0.7–4.0)
MCH: 27.8 pg (ref 26.0–34.0)
MCHC: 29.9 g/dL — ABNORMAL LOW (ref 30.0–36.0)
MCV: 92.9 fL (ref 80.0–100.0)
Monocytes Absolute: 0.9 10*3/uL (ref 0.1–1.0)
Monocytes Relative: 18 %
Neutro Abs: 2.5 10*3/uL (ref 1.7–7.7)
Neutrophils Relative %: 48 %
Platelets: 100 10*3/uL — ABNORMAL LOW (ref 150–400)
RBC: 3.24 MIL/uL — ABNORMAL LOW (ref 4.22–5.81)
RDW: 17.2 % — ABNORMAL HIGH (ref 11.5–15.5)
WBC: 5.2 10*3/uL (ref 4.0–10.5)
nRBC: 0 % (ref 0.0–0.2)

## 2018-07-03 LAB — TYPE AND SCREEN
ABO/RH(D): O POS
Antibody Screen: NEGATIVE
Unit division: 0
Unit division: 0

## 2018-07-03 LAB — BPAM RBC
Blood Product Expiration Date: 202002082359
Blood Product Expiration Date: 202002082359
ISSUE DATE / TIME: 202001112120
ISSUE DATE / TIME: 202001120219
Unit Type and Rh: 5100
Unit Type and Rh: 5100

## 2018-07-03 LAB — AMMONIA: Ammonia: 21 umol/L (ref 9–35)

## 2018-07-03 LAB — GLUCOSE, CAPILLARY
GLUCOSE-CAPILLARY: 176 mg/dL — AB (ref 70–99)
Glucose-Capillary: 169 mg/dL — ABNORMAL HIGH (ref 70–99)
Glucose-Capillary: 204 mg/dL — ABNORMAL HIGH (ref 70–99)
Glucose-Capillary: 292 mg/dL — ABNORMAL HIGH (ref 70–99)

## 2018-07-03 MED ORDER — METOPROLOL TARTRATE 5 MG/5ML IV SOLN
5.0000 mg | Freq: Three times a day (TID) | INTRAVENOUS | Status: DC
  Administered 2018-07-03 – 2018-07-04 (×3): 5 mg via INTRAVENOUS
  Filled 2018-07-03 (×3): qty 5

## 2018-07-03 MED ORDER — METRONIDAZOLE IN NACL 5-0.79 MG/ML-% IV SOLN
500.0000 mg | Freq: Three times a day (TID) | INTRAVENOUS | Status: DC
  Administered 2018-07-03: 500 mg via INTRAVENOUS
  Filled 2018-07-03: qty 100

## 2018-07-03 MED ORDER — ORAL CARE MOUTH RINSE
15.0000 mL | Freq: Two times a day (BID) | OROMUCOSAL | Status: DC
  Administered 2018-07-03 – 2018-07-08 (×11): 15 mL via OROMUCOSAL

## 2018-07-03 MED ORDER — SODIUM CHLORIDE 0.9 % IV SOLN
INTRAVENOUS | Status: DC | PRN
  Administered 2018-07-05 – 2018-07-07 (×3): 1000 mL via INTRAVENOUS

## 2018-07-03 MED ORDER — FUROSEMIDE 10 MG/ML IJ SOLN
20.0000 mg | Freq: Two times a day (BID) | INTRAMUSCULAR | Status: AC
  Administered 2018-07-03 (×2): 20 mg via INTRAVENOUS
  Filled 2018-07-03 (×2): qty 2

## 2018-07-03 MED ORDER — FUROSEMIDE 20 MG PO TABS
20.0000 mg | ORAL_TABLET | Freq: Every day | ORAL | Status: DC
  Administered 2018-07-04 – 2018-07-05 (×2): 20 mg via ORAL
  Filled 2018-07-03 (×3): qty 1

## 2018-07-03 MED ORDER — SODIUM CHLORIDE 0.9 % IV SOLN
1.0000 g | Freq: Two times a day (BID) | INTRAVENOUS | Status: DC
  Administered 2018-07-03 – 2018-07-06 (×6): 1 g via INTRAVENOUS
  Filled 2018-07-03 (×7): qty 1

## 2018-07-03 NOTE — Progress Notes (Signed)
Pharmacy Antibiotic Note  Ricardo Watkins is a 85 y.o. male admitted on 06/30/2018 with fever, acute respiratory failure, suspected pneumonia, possible bacteremia.  Pharmacy has been consulted for meropenem dosing for HCAP.   07/03/2018 Cefepime/vanc/flagyl >>meropenem.  WBC 5.2, SCr 2.18. Tmax 101.8  Plan:  Meropenem 1 gm IV q12  Follow up renal fxn, culture results, and clinical course.  F/u ability to de-escalate antibiotics.   Height: 5\' 8"  (172.7 cm) Weight: 170 lb 10.2 oz (77.4 kg) IBW/kg (Calculated) : 68.4  Temp (24hrs), Avg:100.2 F (37.9 C), Min:98.3 F (36.8 C), Max:101.8 F (38.8 C)  Recent Labs  Lab 06/28/18 0542 06/28/18 1105 06/30/18 1315 06/30/18 1320 06/30/18 1900 07/01/18 0303 07/02/18 0737 07/03/18 0244  WBC  --  4.4  --  4.9  --  5.0 4.3 5.2  CREATININE 2.24*  --   --  2.36*  --  2.30* 2.13* 2.18*  LATICACIDVEN  --   --  1.73  --  1.16  --   --   --     Estimated Creatinine Clearance: 24 mL/min (A) (by C-G formula based on SCr of 2.18 mg/dL (H)).    Allergies  Allergen Reactions  . Glucophage [Metformin Hydrochloride] Other (See Comments)    Chest pain  . Zetia [Ezetimibe] Other (See Comments)    weakness  . Fenofibrate Rash  . Niacin-Lovastatin Er Rash   Antimicrobials this admission:  (Recent abx:  1/6 Cefepime >> 1/8, 1/6 Vancomycin >> 1/7) 1/10 Vancomycin >> 1/13 1/10 Cefepime >>1/13 1/13 meropenem>> 1/13 Metronidazole >> 1/13   Dose adjustments this admission:    Microbiology results: 1/10 BCx: ngtd 1/10 UCx:  ngf 1/11 MRSA PCR: negative 1/11 flu panel: negative   Previous admission: 12/14 BCx: NGF 12/15 BCx: NGF 1/4 MRSA PCR: negative 1/6 BCx: 1/4 GPR (aerobic bottle only), NG x4 d - Eggerthella catenaformis   Thank you for allowing pharmacy to be a part of this patient's care.  Eudelia Bunch, Pharm.D [phone] 07/03/2018 5:16 PM

## 2018-07-03 NOTE — Progress Notes (Signed)
MD made aware that patient is more lethargic, unable to take oral medications at this time. New orders received. Will continue to monitor.

## 2018-07-03 NOTE — Progress Notes (Signed)
PROGRESS NOTE    Ricardo Watkins  BRA:[id number] DOB: 1931/04/28 DOA: 06/30/2018 PCP: [name], MD    Brief Narrative: (Start on day 1 of progress note - keep it brief and live) Per Dr. Tania Ade Ricardo Watkins is a 85 y.o. male with medical history significant of chronic combined heart failure, significant peripheral vascular disease, remote history of prostate cancer, status post renal artery stenting, chronic ITP, on chronic home O2 CAD status post stenting, recently deemed not an intervention candidate, and myelodysplastic syndrome with 5 q. deletion, who gets frequent  hemoglobin checks because of anemia.  Patient was recently discharged on January 8 after admission for acute respiratory failure with hypoxia secondary to acute pulmonary edema in the setting of acute on chronic combined heart failure exac after he received a transfusion for anemia   He represents from home today because of generalized weakness malaise and fatigue.  Family reported that patient began having worsening cough fever 2 days ago and PCP prescribed doxycycline and obtained blood cultures which returned 1 of 2+ GPR.  Family reports being progressively more sleepy over the last day so they presented to the ER.  In the ER patient was unable to provide much meaningful history but wife/daughter-in-law reported lethargy, decrease energy and inability to ambulate more than 2-3 steps with home health aide today.  As a result they brought him to the ER with hospital course as follows:  ED Course: Patient was febrile to 101.9 blood pressure 104/52 pulse of 109 respirations of 21 satting 99% on nasal cannula 4 L.  Review of microbiology shows positive blood cultures at 3 days, a white blood cell count of 4.9 BMP with a mildly elevated potassium at 5.6 and a CO2 of 30 and BUN and creatinine is 76 and 2.3 which is not far from patient's baseline.  In the ER he received Vanco and cefepime although cautious fluid administration  secondary to patient's CHF and rapid decompensation with minimal additional volume.  His lactate was 1.7   Assessment & Plan:   Principal Problem:   Sepsis ([place]) Active Problems:   Essential hypertension   Myelodysplasia (myelodysplastic syndrome) (HCC)   Type 2 diabetes mellitus (HCC)   Chronic systolic CHF (congestive heart failure) (HCC)   Bacteremia due to Gram-positive bacteria   HAP (hospital-acquired pneumonia)   Pressure injury of skin  1 sepsis likely secondary to healthcare associated pneumonia plus or minus bacteremia Patient presented back to the ED with generalized weakness noted to have a fever, lactic acid level of 1.7.  Patient presented with also cough.  Chest x-ray done on admission with right upper lobe airspace opacity most consistent with pneumonia.  Patient recently hospitalized and discharged 2 days prior to admission noted to have a cough just prior to discharge.  Chest x-ray which was done at that time was negative for any new infiltrates.  Patient has been pancultured cultures pending.  During last hospitalization 1 out of 2 blood cultures were positive likely contaminant.  Urinalysis nitrite negative leukocytes negative.  Influenza PCR negative.  Patient noted to have a T-max of 103.3 at 6 PM on 07/01/2018.  No spiking fevers with T-max of 101.6.  Patient during last hospitalization noted to have 1/2 blood cultures positive for eggerthella. Continue IV vancomycin IV cefepime, Pulmicort, Flonase, Mucinex, scheduled nebs.  IV fluids have been saline locked.  Add IV Flagyl to current regimen.  Due to ongoing fevers and 1/2+ blood cultures will consult with ID for  further evaluation and management. Follow.  2.  Hypertension Continue Toprol-XL, Flomax.  Resume home dose Lasix.  3.  Chronic combined systolic and diastolic heart failure Patient noted to be on the dry side on admission.  Patient had presented with fever concerns for sepsis and placed on gentle hydration.   Patient noted during last hospitalization to go into flash pulmonary edema posttransfusion.  Monitor fluid status closely.  Strict I's and O's.  Daily weights.  Status post 2 units packed red blood cells.  Patient received Lasix 20 mg in between units as well as Lasix 20 mg IV after second unit of packed red blood cells.  Patient with some scattered crackles noted on examination.  Resume home dose Lasix.  Follow.   4.  Myelodysplastic syndrome lesion leading to symptomatic microcytic anemia Hemoglobin had dropped to 7.5 on 07/01/2018 at 4 PM.  Patient denied any overt bleeding.  Status post 2 units packed red blood cells.  Hemoglobin currently at 9.0.  Transfusion threshold hemoglobin less than 8 as patient with a history of coronary artery disease.  Decreasing hemoglobin likely dilutional related. Outpatient follow-up with hematology.  Follow.  5.  Acute respiratory failure with hypoxia Secondary to problem #1.  See problem #1.  6.  Hyperkalemia Potassium noted to be 5.6 on admission.  EKG did not have any peaked T waves.  Hyperkalemia resolved potassium currently 4.6 as of 07/01/2018.  Labs pending..  7.  Diabetes mellitus type 2 Hemoglobin A1c was 7.7 on 05/30/2018.  Family states patient with decreased appetite and requesting diet to be liberalized.  Diet has been changed to regular diet.  CBG this morning 169.  Per family patient with improving appetite with change in diet. Continue sliding scale insulin.  Continue half home dose Lantus 80 units daily and uptitrate as needed.  Follow.  8.  Hyperlipidemia Continue statin.  9.  Chronic kidney disease stage IV Stable.  Avoid nephrotoxic agents.  10.  Chronic thrombocytopenia due to ITP Labs pending.  Outpatient follow-up with hematology.  Discontinue heparin.  11.  History of coronary artery disease/peripheral artery disease Currently stable.  Patient looks clinically dry.  Diuretics were held on admission. Patient's Plavix was discontinued  during last hospitalization as patient has not had stenting over 10 years and due to patient's chronic thrombocytopenia and anemia requiring frequent transfusions.  Continue aspirin, statin, beta-blocker.  Resume home dose Lasix.   12.  Constipation Continue current regimen of MiraLAX and Dulcolax suppositories.  Follow.   13.  Stage I pressure injury to buttocks Wound care.  14.  Goals of care Patient noted with a poor prognosis.  Patient with history of coronary artery disease, peripheral artery disease likely not a candidate for any invasive cardiac procedures.  Patient also with a history of myelodysplastic syndrome with symptomatic microcytic anemia and ITP requiring transfusions.  Patient was seen by palliative care during last hospitalization and family at that time was not ready for any comfort measures.  Family concerned about patient's decreased appetite and requesting diet to be liberalized.  Patient's appetite seems to be improving on liberalize diet which we will continue for now. Will treat reversible things for now and monitor.  If patient's condition declines during this hospitalization will have palliative care reassess and meet with family for goals of care.    DVT prophylaxis: SCDs Code Status: DNR Family Communication: Updated patient, and daughter and wife at bedside.  Disposition Plan: Remain in stepdown unit today.    Consultants:  None  Procedures:   Chest x-ray 06/30/2018  2 units packed red blood cells 07/01/2018-07/02/2018  Antimicrobials:   IV vancomycin 06/30/2018  IV cefepime 06/30/2018   Subjective: Patient sitting on bedside commode.  States improving shortness of breath.  Denies any chest pain.  Patient still spiking fevers.  Per family appetite somewhat improving.   Objective: Vitals:   07/02/18 2333 07/03/18 0440 07/03/18 0500 07/03/18 0800  BP:   (!) 134/58   Pulse:   (!) 113   Resp:   (!) 21   Temp: (!) 100.4 F (38 C) (!) 100.5 F (38.1  C)  98.3 F (36.8 C)  TempSrc: Axillary Axillary  Oral  SpO2:   98%   Weight:   77.4 kg   Height:        Intake/Output Summary (Last 24 hours) at 07/03/2018 0924 Last data filed at 07/03/2018 0537 Gross per 24 hour  Intake 100 ml  Output 1100 ml  Net -1000 ml   Filed Weights   07/01/18 0500 07/02/18 0500 07/03/18 0500  Weight: 76.4 kg 77.9 kg 77.4 kg    Examination:  General exam: Frail.  Cachectic.  Mucous members dry. Respiratory system: Some bibasilar crackles.  No wheezing.  No rhonchi.  Speaking in full sentences.  Respiratory effort normal.  Cardiovascular system: Regular rate rhythm with 3/6 SEM no JVD, no gallops, no rubs.  No lower extremity edema.  Gastrointestinal system: Abdomen is nontender, nondistended, soft, positive bowel sounds.  No rebound.  No guarding.   Central nervous system: Alert and oriented. No focal neurological deficits. Extremities: Symmetric 5 x 5 power. Skin: No rashes, lesions or ulcers Psychiatry: Judgement and insight appear normal. Mood & affect appropriate.     Data Reviewed: I have personally reviewed following labs and imaging studies  CBC: Recent Labs  Lab 06/28/18 1105 06/30/18 1320 07/01/18 0303 07/01/18 1604 07/02/18 0737 07/03/18 0244  WBC 4.4 4.9 5.0  --  4.3 5.2  NEUTROABS  --  1.9  --   --  2.2 2.5  HGB 8.6* 8.8* 8.0* 7.5* 9.2* 9.0*  HCT 27.7* 28.7* 27.5* 25.6* 30.6* 30.1*  MCV 92.0 95.0 95.8  --  92.7 92.9  PLT 125* 125* 124*  --  90* 161*   Basic Metabolic Panel: Recent Labs  Lab 06/28/18 0542 06/30/18 1320 06/30/18 1850 07/01/18 0303 07/02/18 0737 07/03/18 0244  NA 137 135  --  138 138 141  K 4.2 5.6* 4.4 4.4 4.6 4.6  CL 95* 94*  --  100 101 103  CO2 30 30  --  25 26 28   GLUCOSE 170* 178*  --  134* 219* 172*  BUN 68* 76*  --  75* 73* 67*  CREATININE 2.24* 2.36*  --  2.30* 2.13* 2.18*  CALCIUM 8.4* 8.1*  --  8.2* 8.1* 8.3*   GFR: Estimated Creatinine Clearance: 24 mL/min (A) (by C-G formula based on  SCr of 2.18 mg/dL (H)). Liver Function Tests: Recent Labs  Lab 06/30/18 1320  AST 26  ALT 22  ALKPHOS 62  BILITOT 1.4*  PROT 8.0  ALBUMIN 2.0*   No results for input(s): LIPASE, AMYLASE in the last 168 hours. No results for input(s): AMMONIA in the last 168 hours. Coagulation Profile: Recent Labs  Lab 06/30/18 1320 07/01/18 0303  INR 1.29 1.43   Cardiac Enzymes: No results for input(s): CKTOTAL, CKMB, CKMBINDEX, TROPONINI in the last 168 hours. BNP (last 3 results) No results for input(s): PROBNP in the last 8760 hours.  HbA1C: No results for input(s): HGBA1C in the last 72 hours. CBG: Recent Labs  Lab 07/02/18 0755 07/02/18 1148 07/02/18 1718 07/02/18 2139 07/03/18 0832  GLUCAP 201* 255* 149* 167* 169*   Lipid Profile: No results for input(s): CHOL, HDL, LDLCALC, TRIG, CHOLHDL, LDLDIRECT in the last 72 hours. Thyroid Function Tests: No results for input(s): TSH, T4TOTAL, FREET4, T3FREE, THYROIDAB in the last 72 hours. Anemia Panel: No results for input(s): VITAMINB12, FOLATE, FERRITIN, TIBC, IRON, RETICCTPCT in the last 72 hours. Sepsis Labs: Recent Labs  Lab 06/30/18 1315 06/30/18 1900 07/01/18 0303  PROCALCITON  --   --  0.39  LATICACIDVEN 1.73 1.16  --     Recent Results (from the past 240 hour(s))  MRSA PCR Screening     Status: None   Collection Time: 06/24/18  8:18 PM  Result Value Ref Range Status   MRSA by PCR NEGATIVE NEGATIVE Final    Comment:        The GeneXpert MRSA Assay (FDA approved for NASAL specimens only), is one component of a comprehensive MRSA colonization surveillance program. It is not intended to diagnose MRSA infection nor to guide or monitor treatment for MRSA infections. Performed at [facility], [place] [address] [place], [place] [address]   Culture, blood (Routine X 2) w Reflex to ID Panel     Status: None   Collection Time: 06/26/18  4:38 PM  Result Value Ref Range Status   Specimen Description    Final    BLOOD RIGHT HAND Performed at [facility] [address] [place], [place] [address]    Special Requests   Final    BOTTLES DRAWN AEROBIC ONLY Blood Culture adequate volume Performed at [facility] [address], [place], [place] [address]    Culture  Setup Time   Final    GRAM POSITIVE RODS AEROBIC BOTTLE ONLY CRITICAL RESULT CALLED TO, READ BACK BY AND VERIFIED WITH: RN CHRISTINA SIX 06/29/18 AT 60 AM BY CM    Culture   Final    Eggerthella catenaformis Standardized susceptibility testing for this organism is not available. Performed at [facility], [place] [address] [place], [place] [address]    Report Status 07/01/2018 FINAL  Final  Culture, blood (Routine X 2) w Reflex to ID Panel     Status: None   Collection Time: 06/26/18  4:45 PM  Result Value Ref Range Status   Specimen Description   Final    BLOOD LEFT ANTECUBITAL Performed at [facility] [address], [place], [place] [address]    Special Requests   Final    BOTTLES DRAWN AEROBIC AND ANAEROBIC Blood Culture adequate volume Performed at [facility] [address] [place], [place] [address]    Culture   Final    NO GROWTH 5 DAYS Performed at [facility], [place] [address], [place], [place] [address]    Report Status 07/01/2018 FINAL  Final  Culture, blood (Routine x 2)     Status: None (Preliminary result)   Collection Time: 06/30/18  2:15 PM  Result Value Ref Range Status   Specimen Description   Final    BLOOD RIGHT WRIST Performed at [facility] [address] [place],  [address]    Special Requests   Final    BOTTLES DRAWN AEROBIC AND ANAEROBIC Blood Culture results may not be optimal due to an inadequate volume of blood received  in culture bottles Performed at [facility], [place] [address] [place] [address], [place], [place] [address]    Culture   Final    NO GROWTH 3  DAYS Performed at [facility], [place] [address], [place], [place] [address]    Report Status PENDING  Incomplete  Culture, blood (Routine x 2)     Status: None (Preliminary result)   Collection Time: 06/30/18  2:30 PM  Result Value Ref Range Status   Specimen Description   Final    BLOOD LEFT ANTECUBITAL Performed at [facility] [address], [place], [place] [address]    Special Requests   Final    BOTTLES DRAWN AEROBIC AND ANAEROBIC Blood Culture adequate volume Performed at [facility] [address], [place], [place] [address]    Culture   Final    NO GROWTH 3 DAYS Performed at [facility], [place] [address], [place], [place] [address]    Report Status PENDING  Incomplete  Urine culture     Status: None   Collection Time: 06/30/18  5:52 PM  Result Value Ref Range Status   Specimen Description URINE, CLEAN CATCH  Final   Special Requests   Final    Immunocompromised Performed at [facility] [address], [place], [place] [address]    Culture NO GROWTH  Final   Report Status 07/02/2018 FINAL  Final  MRSA PCR Screening     Status: None   Collection Time: 07/01/18  9:15 AM  Result Value Ref Range Status   MRSA by PCR NEGATIVE NEGATIVE Final    Comment:        The GeneXpert MRSA Assay (FDA approved for NASAL specimens only), is one component of a comprehensive MRSA colonization surveillance program. It is not intended to diagnose MRSA infection nor to guide or monitor treatment for MRSA infections. Performed at [facility], [place] [address], [place], [place] [address]          Radiology Studies: No results found.      Scheduled Meds: . acetaminophen  650 mg Oral Once  . aspirin EC  81 mg Oral Daily  . budesonide (PULMICORT) nebulizer solution  0.5 mg Nebulization BID  . diphenhydrAMINE  25 mg Oral Once  . feeding supplement (ENSURE ENLIVE)  237 mL Oral BID BM   . fluticasone  2 spray Each Nare Daily  . furosemide  20 mg Oral Daily  . gabapentin  100 mg Oral QHS  . guaiFENesin  1,200 mg Oral BID  . heparin  5,000 Units Subcutaneous Q8H  . insulin aspart  0-5 Units Subcutaneous QHS  . insulin aspart  0-9 Units Subcutaneous TID WC  . insulin glargine  8 Units Subcutaneous Daily  . ipratropium-albuterol  3 mL Nebulization BID  . loratadine  10 mg Oral Daily  . mouth rinse  15 mL Mouth Rinse BID  . metoprolol succinate  50 mg Oral Daily  . multivitamin with minerals  1 tablet Oral [id number]  . polyethylene glycol  17 g Oral Daily  . pravastatin  40 mg Oral Daily  . tamsulosin  0.4 mg Oral Daily   Continuous Infusions: . ceFEPime (MAXIPIME) IV 1 g (07/03/18 1607)  . vancomycin 166.7 mL/hr at 07/02/18 1750     LOS: 3 days    Time spent: 40 mins    Irine Seal, MD [facility]  If 7PM-7AM, please contact night-coverage [email or web address] 07/03/2018, 9:24 AM

## 2018-07-03 NOTE — Consult Note (Signed)
[facility]  Total days of antibiotics 4        Day 3 cefepime/vanco, day 1 metronidazole Reason for Consult: fevers in setting of pneumonia and GPR bacteremia    Referring Physician: [place] Silos  Principal Problem:   Sepsis ([place]) Active Problems:   Essential hypertension   Myelodysplasia (myelodysplastic syndrome) ([place])   Type 2 diabetes mellitus ([place])   Chronic systolic CHF (congestive heart failure) ([place])   Bacteremia due to Gram-positive bacteria   HAP (hospital-acquired pneumonia)   Pressure injury of skin    HPI: Ricardo Watkins is a 85 y.o. male with hx of MDS, with symptomatic microcytic anemia, chronic systolic heart failure, HTN, T2DM who has had recurrent admission in the last few months including in mid December treated for e.faecium bacteremia with pneumonia. Interestingly had large RUL infiltrate that on CT unable to differentiate if possibly features of malignancy since he also had 74mm spiculated lesion. During that course of treatment he also had ongoing fever while on appropriate treatment. He was readmitted on 1/3-1/6 for symptomatic anemia and CHF but did spike temp of 101F during his hospitalization that had no growth at 48hr and patient was discharged home. He was asked to come back to hospital since 1 of 4 bottles grew GPR, identified as egerthella species. In the meantime, patient still having ongoing high fevers   Past Medical History:  Diagnosis Date  . Adenomatous colon polyp   . CAD (coronary artery disease)    30% LAD Stenosis, 70% ramus intermedius stenosis, treated with PTCA and angioplasty by Dr Albertine Patricia 2004  . Cataract    right eye  . CHF (congestive heart failure) ([place])   . Chronic ITP (idiopathic thrombocytopenia) ([facility])   . Cough, persistent 11/04/2015  . Diverticulosis   . DM (diabetes mellitus) ([place])   . DVT (deep venous thrombosis) ([place])    secondary to surgery  . Dyspnea   . Fever 05/01/2018  . Hyperlipidemia   .  Hypertension   . Inguinal hernia    right  . Laceration of finger of right hand 05/01/2018   INDEX FINGER  . 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 Microcytic anemia 07/07/2015  . 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 Non Hodgkin's lymphoma ([place])   . Pleural effusion, left 11/04/2015  . 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 PVD (peripheral vascular disease) ([place])    rt renal artery stent  . Renal insufficiency   . Thrombocytopenia ([place])   . 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 Thyroid nodule    left lower lobe (annual monitoring).  . Vitamin D deficiency     Allergies:  Allergies  Allergen Reactions  . Glucophage [Metformin Hydrochloride] Other (See Comments)    Chest pain  . Zetia [Ezetimibe] Other (See Comments)    weakness  . Fenofibrate Rash  . Niacin-Lovastatin Er Rash    MEDICATIONS: . acetaminophen  650 mg Oral Once  . aspirin EC  81 mg Oral Daily  . budesonide (PULMICORT) nebulizer solution  0.5 mg Nebulization BID  . diphenhydrAMINE  25 mg Oral Once  . feeding supplement (ENSURE ENLIVE)  237 mL Oral BID BM  . fluticasone  2 spray Each Nare  Daily  . furosemide  20 mg Intravenous Q12H  . [START ON 07/04/2018] furosemide  20 mg Oral Daily  . gabapentin  100 mg Oral QHS  . guaiFENesin  1,200 mg Oral BID  . insulin aspart  0-5 Units Subcutaneous QHS  . insulin aspart  0-9 Units Subcutaneous TID WC  . insulin glargine  8 Units Subcutaneous Daily  . ipratropium-albuterol  3 mL Nebulization BID  . loratadine  10 mg Oral Daily  . mouth rinse  15 mL Mouth Rinse BID  . metoprolol tartrate  5 mg Intravenous Q8H  . multivitamin with minerals  1 tablet Oral BH-q7a  . polyethylene glycol  17 g Oral Daily  . pravastatin  40 mg Oral Daily  .  tamsulosin  0.4 mg Oral Daily    Social History   Tobacco Use  . Smoking status: Former Smoker    Packs/day: 0.50    Years: 20.00    Pack years: 10.00    Types: Pipe, Cigarettes    Last attempt to quit: 06/21/1958    Years since quitting: 60.0  . Smokeless tobacco: Never Used  Substance Use Topics  . Alcohol use: No    Alcohol/week: 0.0 standard drinks  . Drug use: No    Family History  Problem Relation Age of Onset  . Dementia Father   . Lymphoma Sister   . Prostate cancer Brother   . Breast cancer Sister   . Diabetes Brother   . Diabetes Sister   . Heart disease Brother   . Asthma Son     Review of Systems - unable to obtain due to confusion   OBJECTIVE: Temp:  [98.3 F (36.8 C)-101.8 F (38.8 C)] 101.1 F (38.4 C) (01/13 1518) Pulse Rate:  [104-116] 104 (01/13 1300) Resp:  [17-32] 32 (01/13 1300) BP: (116-145)/(55-98) 116/56 (01/13 1300) SpO2:  [98 %-100 %] 98 % (01/13 1300) Weight:  [77.4 kg] 77.4 kg (01/13 0500) Physical Exam  Constitutional: He is oriented to person, place, and time. He appears well-developed and well-nourished. No distress.  HENT:  Mouth/Throat: Oropharynx is clear and moist. No oropharyngeal exudate.  Cardiovascular: Normal rate, regular rhythm and normal heart sounds. Exam reveals no gallop and no friction rub.  No murmur heard.  Pulmonary/Chest: Effort normal and breath sounds normal. No respiratory distress. He has no wheezes.  Abdominal: Soft. Bowel sounds are normal. He exhibits no distension. There is no tenderness.  Lymphadenopathy:  He has no cervical adenopathy.  Neurological: He is alert and oriented to person, place, and time.  Skin: Skin is warm and dry. No rash noted. No erythema.  Psychiatric: He has a normal mood and affect. His behavior is normal.     LABS: Results for orders placed or performed during the hospital encounter of 06/30/18 (from the past 48 hour(s))  Glucose, capillary     Status: Abnormal    Collection Time: 07/01/18  5:39 PM  Result Value Ref Range   Glucose-Capillary 208 (H) 70 - 99 mg/dL  Type and screen [place]     Status: None   Collection Time: 07/01/18  6:43 PM  Result Value Ref Range   ABO/RH(D) O POS    Antibody Screen NEG    Sample Expiration 07/04/2018    Unit Number [id number]    Blood Component Type RBC, LR IRR    Unit division 00    Status of Unit ISSUED,FINAL    Transfusion Status OK TO TRANSFUSE    Crossmatch  Result      Compatible Performed at [facility], [place] [address], [place], [place] [address]    Unit Number [id number]    Blood Component Type RBC, LR IRR    Unit division 00    Status of Unit ISSUED,FINAL    Transfusion Status OK TO TRANSFUSE    Crossmatch Result Compatible   Prepare RBC     Status: None   Collection Time: 07/01/18  6:43 PM  Result Value Ref Range   Order Confirmation      ORDER PROCESSED BY BLOOD BANK Performed at [facility], [place] [address] [place], [place] [address]   Glucose, capillary     Status: Abnormal   Collection Time: 07/01/18 10:14 PM  Result Value Ref Range   Glucose-Capillary 119 (H) 70 - 99 mg/dL   Comment 1 Notify RN    Comment 2 Document in Chart   CBC with Differential/Platelet     Status: Abnormal   Collection Time: 07/02/18  7:37 AM  Result Value Ref Range   WBC 4.3 4.0 - 10.5 K/uL    Comment: WHITE COUNT CONFIRMED ON SMEAR   RBC 3.30 (L) 4.22 - 5.81 MIL/uL   Hemoglobin 9.2 (L) 13.0 - 17.0 g/dL   HCT 30.6 (L) 39.0 - 52.0 %   MCV 92.7 80.0 - 100.0 fL   MCH 27.9 26.0 - 34.0 pg   MCHC 30.1 30.0 - 36.0 g/dL   RDW 17.4 (H) 11.5 - 15.5 %   Platelets 90 (L) 150 - 400 K/uL    Comment: REPEATED TO VERIFY PLATELET COUNT CONFIRMED BY SMEAR SPECIMEN CHECKED FOR CLOTS Immature Platelet Fraction may be clinically indicated, consider ordering this additional test [id number]    nRBC 0.0 0.0 - 0.2 %   Neutrophils Relative % 51 %   Neutro  Abs 2.2 1.7 - 7.7 K/uL   Lymphocytes Relative 29 %   Lymphs Abs 1.3 0.7 - 4.0 K/uL   Monocytes Relative 15 %   Monocytes Absolute 0.7 0.1 - 1.0 K/uL   Eosinophils Relative 0 %   Eosinophils Absolute 0.0 0.0 - 0.5 K/uL   Basophils Relative 1 %   Basophils Absolute 0.0 0.0 - 0.1 K/uL   WBC Morphology MORPHOLOGY UNREMARKABLE    Immature Granulocytes 4 %   Abs Immature Granulocytes 0.19 (H) 0.00 - 0.07 K/uL    Comment: Performed at [facility], [place] [address], [place], [place] [address]  Basic metabolic panel     Status: Abnormal   Collection Time: 07/02/18  7:37 AM  Result Value Ref Range   Sodium 138 135 - 145 mmol/L   Potassium 4.6 3.5 - 5.1 mmol/L   Chloride 101 98 - 111 mmol/L   CO2 26 22 - 32 mmol/L   Glucose, Bld 219 (H) 70 - 99 mg/dL   BUN 73 (H) 8 - 23 mg/dL   Creatinine, Ser 2.13 (H) 0.61 - 1.24 mg/dL   Calcium 8.1 (L) 8.9 - 10.3 mg/dL   GFR calc non Af Amer 27 (L) >60 mL/min   GFR calc Af Amer 32 (L) >60 mL/min   Anion gap 11 5 - 15    Comment: Performed at [facility], [place] [address] [place], [place] [address]  Glucose, capillary     Status: Abnormal   Collection Time: 07/02/18  7:55 AM  Result Value Ref Range   Glucose-Capillary 201 (H) 70 - 99 mg/dL  Glucose, capillary  Status: Abnormal   Collection Time: 07/02/18 11:48 AM  Result Value Ref Range   Glucose-Capillary 255 (H) 70 - 99 mg/dL  Glucose, capillary     Status: Abnormal   Collection Time: 07/02/18  5:18 PM  Result Value Ref Range   Glucose-Capillary 149 (H) 70 - 99 mg/dL   Comment 1 Notify RN    Comment 2 Document in Chart   Glucose, capillary     Status: Abnormal   Collection Time: 07/02/18  9:39 PM  Result Value Ref Range   Glucose-Capillary 167 (H) 70 - 99 mg/dL   Comment 1 Notify RN    Comment 2 Document in Chart   CBC with Differential/Platelet     Status: Abnormal   Collection Time: 07/03/18  2:44 AM  Result Value Ref Range   WBC 5.2 4.0 - 10.5 K/uL    RBC 3.24 (L) 4.22 - 5.81 MIL/uL   Hemoglobin 9.0 (L) 13.0 - 17.0 g/dL   HCT 30.1 (L) 39.0 - 52.0 %   MCV 92.9 80.0 - 100.0 fL   MCH 27.8 26.0 - 34.0 pg   MCHC 29.9 (L) 30.0 - 36.0 g/dL   RDW 17.2 (H) 11.5 - 15.5 %   Platelets 100 (L) 150 - 400 K/uL    Comment: REPEATED TO VERIFY PLATELET COUNT CONFIRMED BY SMEAR SPECIMEN CHECKED FOR CLOTS Immature Platelet Fraction may be clinically indicated, consider ordering this additional test [id number]    nRBC 0.0 0.0 - 0.2 %   Neutrophils Relative % 48 %   Neutro Abs 2.5 1.7 - 7.7 K/uL   Lymphocytes Relative 29 %   Lymphs Abs 1.5 0.7 - 4.0 K/uL   Monocytes Relative 18 %   Monocytes Absolute 0.9 0.1 - 1.0 K/uL   Eosinophils Relative 0 %   Eosinophils Absolute 0.0 0.0 - 0.5 K/uL   Basophils Relative 0 %   Basophils Absolute 0.0 0.0 - 0.1 K/uL   WBC Morphology MILD LEFT SHIFT (1-5% METAS, OCC MYELO, OCC BANDS)    Immature Granulocytes 5 %   Abs Immature Granulocytes 0.27 (H) 0.00 - 0.07 K/uL    Comment: Performed at [facility], [place] [address] [place], [place] [address]  Basic metabolic panel     Status: Abnormal   Collection Time: 07/03/18  2:44 AM  Result Value Ref Range   Sodium 141 135 - 145 mmol/L   Potassium 4.6 3.5 - 5.1 mmol/L   Chloride 103 98 - 111 mmol/L   CO2 28 22 - 32 mmol/L   Glucose, Bld 172 (H) 70 - 99 mg/dL   BUN 67 (H) 8 - 23 mg/dL   Creatinine, Ser 2.18 (H) 0.61 - 1.24 mg/dL   Calcium 8.3 (L) 8.9 - 10.3 mg/dL   GFR calc non Af Amer 27 (L) >60 mL/min   GFR calc Af Amer 31 (L) >60 mL/min   Anion gap 10 5 - 15    Comment: Performed at [facility], [place] [address] [place], [place] [address]  Glucose, capillary     Status: Abnormal   Collection Time: 07/03/18  8:32 AM  Result Value Ref Range   Glucose-Capillary 169 (H) 70 - 99 mg/dL  Glucose, capillary     Status: Abnormal   Collection Time: 07/03/18 11:59 AM  Result Value Ref Range   Glucose-Capillary 204 (H) 70 - 99  mg/dL  Ammonia     Status: None   Collection Time: 07/03/18 12:22 PM  Result Value Ref Range  Ammonia 21 9 - 35 umol/L    Comment: Performed at [facility], [place] [address] [place], [place] [address]  Glucose, capillary     Status: Abnormal   Collection Time: 07/03/18  4:52 PM  Result Value Ref Range   Glucose-Capillary 292 (H) 70 - 99 mg/dL   *Note: Due to a large number of results and/or encounters for the requested time period, some results have not been displayed. A complete set of results can be found in Results Review.    MICRO: reviewed IMAGING: I have reviewed his cxr from admit in comparison to films from 12/12 which show dense RUL infiltrate, unchanged  Assessment/Plan:  85yo M with pneumonia vs malignancy in the setting of eggerthella sp bacteremia which is thought to be clinically significant in immunocompromised hosts  - would switch abtx to meropenem to see if makes any difference to fever curve - still wonder if this is related to  malignancy  eggethella =thought to be from gut translocation.

## 2018-07-04 DIAGNOSIS — R509 Fever, unspecified: Secondary | ICD-10-CM

## 2018-07-04 DIAGNOSIS — R7881 Bacteremia: Secondary | ICD-10-CM

## 2018-07-04 DIAGNOSIS — D469 Myelodysplastic syndrome, unspecified: Secondary | ICD-10-CM

## 2018-07-04 DIAGNOSIS — H5462 Unqualified visual loss, left eye, normal vision right eye: Secondary | ICD-10-CM

## 2018-07-04 DIAGNOSIS — B9689 Other specified bacterial agents as the cause of diseases classified elsewhere: Secondary | ICD-10-CM

## 2018-07-04 DIAGNOSIS — J181 Lobar pneumonia, unspecified organism: Secondary | ICD-10-CM

## 2018-07-04 DIAGNOSIS — N179 Acute kidney failure, unspecified: Secondary | ICD-10-CM

## 2018-07-04 LAB — GLUCOSE, CAPILLARY
GLUCOSE-CAPILLARY: 264 mg/dL — AB (ref 70–99)
Glucose-Capillary: 159 mg/dL — ABNORMAL HIGH (ref 70–99)
Glucose-Capillary: 169 mg/dL — ABNORMAL HIGH (ref 70–99)
Glucose-Capillary: 249 mg/dL — ABNORMAL HIGH (ref 70–99)

## 2018-07-04 LAB — CBC WITH DIFFERENTIAL/PLATELET
Abs Immature Granulocytes: 0.35 10*3/uL — ABNORMAL HIGH (ref 0.00–0.07)
Basophils Absolute: 0 10*3/uL (ref 0.0–0.1)
Basophils Relative: 0 %
EOS PCT: 0 %
Eosinophils Absolute: 0 10*3/uL (ref 0.0–0.5)
HCT: 31.9 % — ABNORMAL LOW (ref 39.0–52.0)
Hemoglobin: 9.5 g/dL — ABNORMAL LOW (ref 13.0–17.0)
Immature Granulocytes: 6 %
Lymphocytes Relative: 23 %
Lymphs Abs: 1.4 10*3/uL (ref 0.7–4.0)
MCH: 28.4 pg (ref 26.0–34.0)
MCHC: 29.8 g/dL — ABNORMAL LOW (ref 30.0–36.0)
MCV: 95.5 fL (ref 80.0–100.0)
Monocytes Absolute: 1 10*3/uL (ref 0.1–1.0)
Monocytes Relative: 18 %
Neutro Abs: 3.2 10*3/uL (ref 1.7–7.7)
Neutrophils Relative %: 53 %
Platelets: 113 10*3/uL — ABNORMAL LOW (ref 150–400)
RBC: 3.34 MIL/uL — ABNORMAL LOW (ref 4.22–5.81)
RDW: 17 % — ABNORMAL HIGH (ref 11.5–15.5)
WBC: 5.9 10*3/uL (ref 4.0–10.5)
nRBC: 0 % (ref 0.0–0.2)

## 2018-07-04 LAB — BASIC METABOLIC PANEL
Anion gap: 9 (ref 5–15)
BUN: 64 mg/dL — ABNORMAL HIGH (ref 8–23)
CO2: 29 mmol/L (ref 22–32)
Calcium: 8.5 mg/dL — ABNORMAL LOW (ref 8.9–10.3)
Chloride: 104 mmol/L (ref 98–111)
Creatinine, Ser: 2.06 mg/dL — ABNORMAL HIGH (ref 0.61–1.24)
GFR calc Af Amer: 33 mL/min — ABNORMAL LOW (ref >60)
GFR calc non Af Amer: 29 mL/min — ABNORMAL LOW (ref >60)
Glucose, Bld: 177 mg/dL — ABNORMAL HIGH (ref 70–99)
POTASSIUM: 4.4 mmol/L (ref 3.5–5.1)
Sodium: 142 mmol/L (ref 135–145)

## 2018-07-04 MED ORDER — METOPROLOL TARTRATE 5 MG/5ML IV SOLN
5.0000 mg | Freq: Four times a day (QID) | INTRAVENOUS | Status: DC
  Administered 2018-07-04 – 2018-07-08 (×18): 5 mg via INTRAVENOUS
  Filled 2018-07-04 (×18): qty 5

## 2018-07-04 MED ORDER — HYDROCODONE-HOMATROPINE 5-1.5 MG/5ML PO SYRP
5.0000 mL | ORAL_SOLUTION | Freq: Four times a day (QID) | ORAL | Status: DC | PRN
  Administered 2018-07-04 – 2018-07-06 (×3): 5 mL via ORAL
  Filled 2018-07-04 (×3): qty 5

## 2018-07-04 NOTE — Care Management Note (Signed)
Case Management Note  Patient Details  Name: Ricardo Watkins MRN: [id number] Date of Birth: 03-07-1931  Subjective/Objective:                  Return to top of Pneumonia RRG - [place]  Discharge readiness is indicated by patient meeting Recovery Milestones, including ALL of the following: ? Hemodynamic stability  Temp-101.8,hr=126. rr=32 ? Tachypnea absent no rr -32 ? Hypoxemia absent  ? Afebrile, or temperature acceptable for next level of care ? Oxygen absent or at baseline need ? Mental status at baseline ? Antibiotic regimen acceptable for next level of care ? Ambulatory ? Oral hydration, medications, and diet ? Discharge plans and education understood   Action/Plan: Following for progression of care and condition. Following for cm needs none present at this time.  Expected Discharge Date:  (unknown)               Expected Discharge Plan:     In-House Referral:     Discharge planning Services     Post Acute Care Choice:    Choice offered to:     DME Arranged:    DME Agency:     HH Arranged:    HH Agency:     Status of Service:     If discussed at [employer], dates discussed:    Additional Comments:  Leeroy Cha, RN 07/04/2018, 10:19 AM

## 2018-07-04 NOTE — Progress Notes (Signed)
07/04/2018  0859  Notified MD of pt's temp 101.4. Will continue to monitor. Waiting for orders.

## 2018-07-04 NOTE — Progress Notes (Addendum)
PROGRESS NOTE    Ricardo Watkins  FWY:[id number] DOB: [birth year] DOA: 06/30/2018 PCP: [name], MD    Brief Narrative: (Start on day 1 of progress note - keep it brief and live) Per Dr. Tania Ade R Ricardo Watkins is a 85 y.o. male with medical history significant of chronic combined heart failure, significant peripheral vascular disease, remote history of prostate cancer, status post renal artery stenting, chronic ITP, on chronic home O2 CAD status post stenting, recently deemed not an intervention candidate, and myelodysplastic syndrome with 5 q. deletion, who gets frequent  hemoglobin checks because of anemia.  Patient was recently discharged on January 8 after admission for acute respiratory failure with hypoxia secondary to acute pulmonary edema in the setting of acute on chronic combined heart failure exac after he received a transfusion for anemia   He represents from home today because of generalized weakness malaise and fatigue.  Family reported that patient began having worsening cough fever 2 days ago and PCP prescribed doxycycline and obtained blood cultures which returned 1 of 2+ GPR.  Family reports being progressively more sleepy over the last day so they presented to the ER.  In the ER patient was unable to provide much meaningful history but wife/daughter-in-law reported lethargy, decrease energy and inability to ambulate more than 2-3 steps with home health aide today.  As a result they brought him to the ER with hospital course as follows:  ED Course: Patient was febrile to 101.9 blood pressure 104/52 pulse of 109 respirations of 21 satting 99% on nasal cannula 4 L.  Review of microbiology shows positive blood cultures at 3 days, a white blood cell count of 4.9 BMP with a mildly elevated potassium at 5.6 and a CO2 of 30 and BUN and creatinine is 76 and 2.3 which is not far from patient's baseline.  In the ER he received Vanco and cefepime although cautious fluid administration  secondary to patient's CHF and rapid decompensation with minimal additional volume.  His lactate was 1.7   Assessment & Plan:   Principal Problem:   Sepsis ([place]) Active Problems:   Essential hypertension   Myelodysplasia (myelodysplastic syndrome) (HCC)   Type 2 diabetes mellitus (HCC)   Chronic systolic CHF (congestive heart failure) (HCC)   Bacteremia   Bacteremia due to Gram-positive bacteria   HAP (hospital-acquired pneumonia)   Pressure injury of skin  1 sepsis likely secondary to healthcare associated pneumonia and gram-positive bacteremia with eggethella species Patient presented back to the ED with generalized weakness noted to have a fever, lactic acid level of 1.7.  Patient presented with also cough.  Chest x-ray done on admission with right upper lobe airspace opacity most consistent with pneumonia.  Patient recently hospitalized and discharged 2 days prior to admission noted to have a cough just prior to discharge.  Chest x-ray which was done at that time was negative for any new infiltrates.  Patient has been pancultured cultures pending.  During last hospitalization 1 out of 2 blood cultures were positive likely contaminant.  Urinalysis nitrite negative leukocytes negative.  Influenza PCR negative.  Patient noted to have a T-max of 103.3 at 6 PM on 07/01/2018.  Patient spiking fevers with T-max of 101.8 this morning.  Patient during last hospitalization noted to have 1/2 blood cultures positive for eggerthella.  Patient was on IV vancomycin and IV cefepime which has been transitioned to IV Merrem per ID who has been following the patient for ongoing fevers.  Continue Pulmicort, Flonase,  Mucinex, scheduled nebs.  IV fluids have been saline locked.  Due to ongoing fevers and 1/2+ blood cultures ID was consulted and IV antibiotics switched to IV Merrem.  Patient also noted from CT scan of 06/01/2018 to have concerns on scan for lung mass/malignancy.  Fevers may be secondary to malignancy.   Continue current empiric IV antibiotics.  ID following and appreciate input and recommendations.   2.  Hypertension Continue current regimen of Toprol-XL, Flomax, Lasix.  3.  Chronic combined systolic and diastolic heart failure Patient noted to be on the dry side on admission.  Patient had presented with fever concerns for sepsis and placed on gentle hydration.  Patient noted during last hospitalization to go into flash pulmonary edema posttransfusion.  Monitor fluid status closely.  Strict I's and O's.  Daily weights.  Status post 2 units packed red blood cells.  Patient received Lasix 20 mg in between units as well as Lasix 20 mg IV after second unit of packed red blood cells.  Patient with some scattered crackles noted on examination.  Resumed home dose Lasix.  Follow.   4.  Myelodysplastic syndrome lesion leading to symptomatic microcytic anemia Hemoglobin had dropped to 7.5 on 07/01/2018 at 4 PM.  Patient denied any overt bleeding.  Status post 2 units packed red blood cells.  Hemoglobin currently at 9.5.  Transfusion threshold hemoglobin less than 8 as patient with a history of coronary artery disease.  Decreasing hemoglobin likely dilutional related. Outpatient follow-up with hematology.  Follow.  5.  Acute respiratory failure with hypoxia Secondary to problem #1.  See problem #1.  6.  Hyperkalemia Potassium noted to be 5.6 on admission.  EKG did not have any peaked T waves.  Hyperkalemia resolved potassium currently 4.4 as of 07/04/2018.   7.  Diabetes mellitus type 2 Hemoglobin A1c was 7.7 on 05/30/2018.  Family states patient with some improvement with his oral intake after diet was liberalized to a regular diet.  Diet has been changed to a soft diet.  CBG this morning was 159.  Continue current dose of Lantus 8 units daily as well as sliding scale insulin.  Follow.  8.  Hyperlipidemia Continue statin.  9.  Chronic kidney disease stage IV Stable.  Avoid nephrotoxic agents.  10.   Chronic thrombocytopenia due to ITP Labs pending.  Outpatient follow-up with hematology.  Discontinued heparin.  11.  History of coronary artery disease/peripheral artery disease Currently stable.  Patient looks clinically dry.  Diuretics were held on admission. Patient's Plavix was discontinued during last hospitalization as patient has not had stenting over 10 years and due to patient's chronic thrombocytopenia and anemia requiring frequent transfusions.  Continue aspirin, statin, beta-blocker.  Resumed home dose Lasix.   12.  Constipation Continue current regimen of MiraLAX and Dulcolax suppositories.  Follow.   13.  Stage I pressure injury to buttocks Continue current wound care.  14.  Goals of care Patient noted with a poor prognosis.  Patient with history of coronary artery disease, peripheral artery disease likely not a candidate for any invasive cardiac procedures.  Patient also with a history of myelodysplastic syndrome with symptomatic microcytic anemia and ITP requiring transfusions.  Patient was seen by palliative care during last hospitalization and family at that time was not ready for any comfort measures.  Family concerned about patient's decreased appetite and requesting diet to be liberalized.  Patient's appetite seems to be improving on liberalize diet which we will continue for now. Will treat reversible things  for now and monitor.  If patient's condition declines during this hospitalization will have palliative care reassess and meet with family for goals of care.    DVT prophylaxis: SCDs Code Status: DNR Family Communication: Updated patient, and son and wife at bedside. Disposition Plan: Remain in stepdown unit today.    Consultants:   Infectious disease: Dr. Baxter Flattery 07/03/2018  Procedures:   Chest x-ray 06/30/2018  2 units packed red blood cells 07/01/2018-07/02/2018  Antimicrobials:   IV vancomycin 06/30/2018>>>>>> 07/03/2018  IV cefepime 06/30/2018>>>>>>  07/03/2018  IV Merrem 07/03/2018   Subjective: Patient sitting on bedpan.  States some improvement with shortness of breath.  No chest pain.  Patient still noted to be spiking fevers early this morning of 101.4 around 8 AM.  Patient denies any chest pain.  Patient complains of cough.  Wife at bedside states patient is coughing even when he eats food or drinks liquid.  Family at bedside.   Objective: Vitals:   07/04/18 0600 07/04/18 0700 07/04/18 0800 07/04/18 0805  BP: (!) 123/54 128/87 (!) 134/57   Pulse: (!) 106 (!) 111 (!) 119   Resp: 11 14 (!) 30   Temp:    (!) 101.4 F (38.6 C)  TempSrc:    Axillary  SpO2: 98% 99% 96%   Weight:      Height:        Intake/Output Summary (Last 24 hours) at 07/04/2018 0924 Last data filed at 07/04/2018 0600 Gross per 24 hour  Intake 315.36 ml  Output 1450 ml  Net -1134.64 ml   Filed Weights   07/02/18 0500 07/03/18 0500 07/04/18 0500  Weight: 77.9 kg 77.4 kg 76 kg    Examination:  General exam: Frail.  Cachectic.  Mucous members dry. Respiratory system: Crackles.  No wheezing.  No rhonchi.  Speaking in full sentences.  Cardiovascular system: RRR with 3/6 SEM no JVD, no gallops, no rubs.  No lower extremity edema.  Gastrointestinal system: Abdomen is soft, nontender, nondistended, positive bowel sounds.  No rebound.  No guarding.  Central nervous system: Alert and oriented. No focal neurological deficits. Extremities: Symmetric 5 x 5 power. Skin: No rashes, lesions or ulcers Psychiatry: Judgement and insight appear normal. Mood & affect appropriate.     Data Reviewed: I have personally reviewed following labs and imaging studies  CBC: Recent Labs  Lab 06/30/18 1320 07/01/18 0303 07/01/18 1604 07/02/18 0737 07/03/18 0244 07/04/18 0254  WBC 4.9 5.0  --  4.3 5.2 5.9  NEUTROABS 1.9  --   --  2.2 2.5 3.2  HGB 8.8* 8.0* 7.5* 9.2* 9.0* 9.5*  HCT 28.7* 27.5* 25.6* 30.6* 30.1* 31.9*  MCV 95.0 95.8  --  92.7 92.9 95.5  PLT 125* 124*   --  90* 100* 102*   Basic Metabolic Panel: Recent Labs  Lab 06/30/18 1320 06/30/18 1850 07/01/18 0303 07/02/18 0737 07/03/18 0244 07/04/18 0254  NA 135  --  138 138 141 142  K 5.6* 4.4 4.4 4.6 4.6 4.4  CL 94*  --  100 101 103 104  CO2 30  --  25 26 28 29   GLUCOSE 178*  --  134* 219* 172* 177*  BUN 76*  --  75* 73* 67* 64*  CREATININE 2.36*  --  2.30* 2.13* 2.18* 2.06*  CALCIUM 8.1*  --  8.2* 8.1* 8.3* 8.5*   GFR: Estimated Creatinine Clearance: 25.4 mL/min (A) (by C-G formula based on SCr of 2.06 mg/dL (H)). Liver Function Tests: Recent Labs  Lab 06/30/18  1320  AST 26  ALT 22  ALKPHOS 62  BILITOT 1.4*  PROT 8.0  ALBUMIN 2.0*   No results for input(s): LIPASE, AMYLASE in the last 168 hours. Recent Labs  Lab 07/03/18 1222  AMMONIA 21   Coagulation Profile: Recent Labs  Lab 06/30/18 1320 07/01/18 0303  INR 1.29 1.43   Cardiac Enzymes: No results for input(s): CKTOTAL, CKMB, CKMBINDEX, TROPONINI in the last 168 hours. BNP (last 3 results) No results for input(s): PROBNP in the last 8760 hours. HbA1C: No results for input(s): HGBA1C in the last 72 hours. CBG: Recent Labs  Lab 07/03/18 0832 07/03/18 1159 07/03/18 1652 07/03/18 2128 07/04/18 0758  GLUCAP 169* 204* 292* 176* 159*   Lipid Profile: No results for input(s): CHOL, HDL, LDLCALC, TRIG, CHOLHDL, LDLDIRECT in the last 72 hours. Thyroid Function Tests: No results for input(s): TSH, T4TOTAL, FREET4, T3FREE, THYROIDAB in the last 72 hours. Anemia Panel: No results for input(s): VITAMINB12, FOLATE, FERRITIN, TIBC, IRON, RETICCTPCT in the last 72 hours. Sepsis Labs: Recent Labs  Lab 06/30/18 1315 06/30/18 1900 07/01/18 0303  PROCALCITON  --   --  0.39  LATICACIDVEN 1.73 1.16  --     Recent Results (from the past 240 hour(s))  MRSA PCR Screening     Status: None   Collection Time: 06/24/18  8:18 PM  Result Value Ref Range Status   MRSA by PCR NEGATIVE NEGATIVE Final    Comment:        The  GeneXpert MRSA Assay (FDA approved for NASAL specimens only), is one component of a comprehensive MRSA colonization surveillance program. It is not intended to diagnose MRSA infection nor to guide or monitor treatment for MRSA infections. Performed at [facility], [place] [address], [place], [place] [address]   Culture, blood (Routine X 2) w Reflex to ID Panel     Status: None   Collection Time: 06/26/18  4:38 PM  Result Value Ref Range Status   Specimen Description   Final    BLOOD RIGHT HAND Performed at [facility] [place] [address] [place], [place] [address]    Special Requests   Final    BOTTLES DRAWN AEROBIC ONLY Blood Culture adequate volume Performed at [facility] [address] [place], [place] [address]    Culture  Setup Time   Final    GRAM POSITIVE RODS AEROBIC BOTTLE ONLY CRITICAL RESULT CALLED TO, READ BACK BY AND VERIFIED WITH: RN CHRISTINA SIX 06/29/18 AT 18 AM BY CM    Culture   Final    Eggerthella catenaformis Standardized susceptibility testing for this organism is not available. Performed at [facility], [place] [address], [place], [place] [address]    Report Status 07/01/2018 FINAL  Final  Culture, blood (Routine X 2) w Reflex to ID Panel     Status: None   Collection Time: 06/26/18  4:45 PM  Result Value Ref Range Status   Specimen Description   Final    BLOOD LEFT ANTECUBITAL Performed at [place] [address], [place], [place] [address]    Special Requests   Final    BOTTLES DRAWN AEROBIC AND ANAEROBIC Blood Culture adequate volume Performed at [place] [address], [place], [place] [address]    Culture   Final    NO GROWTH 5 DAYS Performed at [facility], [place] [address], [place],  [address]    Report Status 07/01/2018  FINAL  Final  Culture, blood (Routine x 2)     Status: None (Preliminary result)   Collection  Time: 06/30/18  2:15 PM  Result Value Ref Range Status   Specimen Description BLOOD RIGHT WRIST  Final   Special Requests   Final    BOTTLES DRAWN AEROBIC AND ANAEROBIC Blood Culture results may not be optimal due to an inadequate volume of blood received in culture bottles Performed at [facility], [place] [address] [place], [place] [address]    Culture   Final    NO GROWTH 3 DAYS Performed at [facility], [place] [address] [place], [place] [address]    Report Status PENDING  Incomplete  Culture, blood (Routine x 2)     Status: None (Preliminary result)   Collection Time: 06/30/18  2:30 PM  Result Value Ref Range Status   Specimen Description   Final    BLOOD LEFT ANTECUBITAL Performed at [place] [address] [place], [place] [address]    Special Requests   Final    BOTTLES DRAWN AEROBIC AND ANAEROBIC Blood Culture adequate volume Performed at [place] [address] [place], [place] [address]    Culture   Final    NO GROWTH 3 DAYS Performed at [facility], [place] [address], [place], [place] [address]    Report Status PENDING  Incomplete  Urine culture     Status: None   Collection Time: 06/30/18  5:52 PM  Result Value Ref Range Status   Specimen Description URINE, CLEAN CATCH  Final   Special Requests   Final    Immunocompromised Performed at [place] [address], [place], [place] [address]    Culture NO GROWTH  Final   Report Status 07/02/2018 FINAL  Final  MRSA PCR Screening     Status: None   Collection Time: 07/01/18  9:15 AM  Result Value Ref Range Status   MRSA by PCR NEGATIVE NEGATIVE Final    Comment:        The GeneXpert MRSA Assay (FDA approved for NASAL specimens only), is one component of a comprehensive MRSA colonization surveillance program. It is not intended to diagnose MRSA infection nor to guide or monitor treatment for MRSA  infections. Performed at [facility], [place] [address] [place], [place] [address]          Radiology Studies: No results found.      Scheduled Meds: . aspirin EC  81 mg Oral Daily  . budesonide (PULMICORT) nebulizer solution  0.5 mg Nebulization BID  . feeding supplement (ENSURE ENLIVE)  237 mL Oral BID BM  . fluticasone  2 spray Each Nare Daily  . furosemide  20 mg Oral Daily  . gabapentin  100 mg Oral QHS  . guaiFENesin  1,200 mg Oral BID  . insulin aspart  0-5 Units Subcutaneous QHS  . insulin aspart  0-9 Units Subcutaneous TID WC  . insulin glargine  8 Units Subcutaneous Daily  . ipratropium-albuterol  3 mL Nebulization BID  . loratadine  10 mg Oral Daily  . mouth rinse  15 mL Mouth Rinse BID  . metoprolol tartrate  5 mg Intravenous Q8H  . multivitamin with minerals  1 tablet Oral BH-q7a  . polyethylene glycol  17 g Oral Daily  . pravastatin  40 mg Oral Daily  . tamsulosin  0.4 mg Oral Daily   Continuous Infusions: . sodium chloride  10 mL/hr at 07/04/18 0000  . meropenem (MERREM) IV 1 g (07/04/18 0509)     LOS: 4 days    Time spent: 40 mins    Irine Seal, MD [facility]  If 7PM-7AM, please contact night-coverage [email or web address] 07/04/2018, 9:24 AM

## 2018-07-04 NOTE — Progress Notes (Signed)
Physical Therapy Treatment Patient Details Name: Ricardo Watkins MRN: [id number] DOB: Oct 03, 1930 Today's Date: 07/04/2018    History of Present Illness 85 yo male admitted with sepsis likely secondary to healthcare associated pneumonia, recent admission, discharged on 06/28/18 with  acute respiratory failure; PMHx: CHF, DM. Hx of DM, CHF, PVD, prostate ca, CAD, MDS, NHL, DVT    PT Comments    Pt in bed on 4 lts: BP 113/53, HR 102, RR 22, O2 sats on 4 lts 97% Assisted to EOB BP 120/61, HR 109, RR 28, O2 sats 100% on 4 lts. Performed static sitting activity EOB x 6 min.  Pt present with much forward cervical posture and collapsed trunk sitting.  Assisted to recliner.  General transfer comment: "Bear Hug" stand pivot sit from elevated bed to recliner placed 1/4 pivot.  Pt able to stand and support his body weight but required much assist to complete pivot turn and control decend to recliner due to weakness.  General Gait Details: unable to attempt amb due to poor transfer ability Pt very weak with noted increased coughing with physical activity.   Pt progressing slowly and will need ST [facility] at SNF   Follow Up Recommendations  SNF;Supervision/Assistance - 24 hour     Equipment Recommendations  None recommended by PT    Recommendations for Other Services       Precautions / Restrictions Precautions Precautions: Fall Precaution Comments: monitor O2 sats/VS Restrictions Weight Bearing Restrictions: No    Mobility  Bed Mobility Overal bed mobility: Needs Assistance Bed Mobility: Supine to Sit     Supine to sit: +2 for safety/equipment;+2 for physical assistance;Max assist     General bed mobility comments: assist for trunk and LEs in both directions.  Tolerated static siting EOB x 6 min with Mod Assist for lean LOB all planes   Transfers Overall transfer level: Needs assistance Equipment used: None(pt too weak to grasp walker/AD) Transfers: Stand Pivot Transfers   Stand pivot  transfers: Mod assist       General transfer comment: "Bear Hug" stand pivot sit from elevated bed to recliner placed 1/4 pivot.  Pt able to stand and support his body weight but required much assist to complete pivot turn and control decend to recliner due to weakness.    Ambulation/Gait             General Gait Details: unable to attempt amb due to poor transfer ability   Stairs             Wheelchair Mobility    Modified Rankin (Stroke Patients Only)       Balance                                            Cognition                                              Exercises      General Comments        Pertinent Vitals/Pain Pain Assessment: Faces Faces Pain Scale: No hurt    Home Living                      Prior Function  PT Goals (current goals can now be found in the care plan section) Progress towards PT goals: Progressing toward goals    Frequency    Min 2X/week      PT Plan Current plan remains appropriate    Co-evaluation              AM-PAC PT "6 Clicks" Mobility   Outcome Measure  Help needed turning from your back to your side while in a flat bed without using bedrails?: A Lot Help needed moving from lying on your back to sitting on the side of a flat bed without using bedrails?: A Lot   Help needed standing up from a chair using your arms (e.g., wheelchair or bedside chair)?: Total Help needed to walk in hospital room?: Total Help needed climbing 3-5 steps with a railing? : Total 6 Click Score: 7    End of Session Equipment Utilized During Treatment: Oxygen Activity Tolerance: Patient limited by fatigue Patient left: in chair;with family/visitor present;with call bell/phone within reach Nurse Communication: Mobility status PT Visit Diagnosis: Muscle weakness (generalized) (M62.81);Difficulty in walking, not elsewhere classified (R26.2);Unsteadiness on feet  (R26.81)     Time: 1355-1420 PT Time Calculation (min) (ACUTE ONLY): 25 min  Charges:  $Therapeutic Activity: 23-37 mins                     Rica Koyanagi  PTA Acute  Rehabilitation Services Pager      [phone] Office      [phone]

## 2018-07-04 NOTE — Evaluation (Signed)
Clinical/Bedside Swallow Evaluation Patient Details  Name: Ricardo Watkins MRN: [id number] Date of Birth: Dec 10, 1930  Today's Date: 07/04/2018 Time: SLP Start Time (ACUTE ONLY): 1025 SLP Stop Time (ACUTE ONLY): 1100 SLP Time Calculation (min) (ACUTE ONLY): 35 min  Past Medical History:  Past Medical History:  Diagnosis Date  . Adenomatous colon polyp   . CAD (coronary artery disease)    30% LAD Stenosis, 70% ramus intermedius stenosis, treated with PTCA and angioplasty by Dr Albertine Patricia 2004  . Cataract    right eye  . CHF (congestive heart failure) ([place])   . Chronic ITP (idiopathic thrombocytopenia) (H[facility])   . Cough, persistent 11/04/2015  . Diverticulosis   . DM (diabetes mellitus) ([place])   . DVT (deep venous thrombosis) ([place])    secondary to surgery  . Dyspnea   . Fever 05/01/2018  . Hyperlipidemia   . Hypertension   . Inguinal hernia    right  . Laceration of finger of right hand 05/01/2018   INDEX FINGER  . 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 Microcytic anemia 07/07/2015  . 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 Non Hodgkin's lymphoma ([place])   . Pleural effusion, left 11/04/2015  . Prostate CA ([place]) 09/10/2011   Gleason 3+3 R, 3+4 L lobe May 2007 Rx Radioactive seed implants Dr [name]  . PVD (peripheral vascular disease) ([place])    rt renal artery stent  . Renal insufficiency   . Thrombocytopenia ([place])   . Thrombotic stroke ([place]) 09/10/2011   January 18, 2011 infarct genu & post limb R internal capsule - acute; previous lacunar infarcts/extensive white matter dis  . Thyroid nodule    left lower lobe (annual monitoring).  . Vitamin D deficiency    Past Surgical History:  Past Surgical History:  Procedure Laterality Date  . APPENDECTOMY     patient ?  Marland Kitchen CARDIAC CATHETERIZATION    . CATARACT EXTRACTION    . CHEST TUBE INSERTION Left 02/10/2016   Procedure: INSERTION PLEURAL DRAINAGE CATHETER;  Surgeon: [name], MD;  Location: [facility];  Service: Thoracic;  Laterality: Left;  . CHEST TUBE INSERTION Left 04/08/2016   Procedure: CHEST TUBE INSERTION;  Surgeon: [name], MD;  Location: [facility];  Service: Thoracic;  Laterality: Left;  . COLONOSCOPY    . CORONARY ANGIOPLASTY    . CYSTOURETHROSCOPY     ROBOTIC ARM NUCLETRON SEED IMPLANTATION OF PROSTATE  . EXPLORATORY LAPAROTOMY     For evaluation of lymphoma  . REMOVAL OF PLEURAL DRAINAGE CATHETER Left 04/08/2016   Procedure: REMOVAL OF PLEURAL DRAINAGE CATHETER;  Surgeon: [name], MD;  Location: [facility] OR;  Service: Thoracic;  Laterality: Left;   HPI:  85 year old male admitted 06/30/2018 with generalized weakness malaise and fatigue, cough and fever. PMH: DM, Non-Hodgkins Lymphoma, chronic combined heart failure, significant PVD, prostate cancer, s/p renal artery stenting, chronic ITP, CAD s/p stenting, myelodysplastic syndrome, anemia.  Patient was recently discharged on January 8 after admission for acute respiratory failure with hypoxia secondary to acute pulmonary edema in the setting of acute on chronic combined heart failure exac after he received a transfusion for anemia. CXR 1/10 = right upper lobe airspace opacity c/w PNA.   Assessment / Plan / Recommendation Clinical Impression  Pt returns to hospital with PNA. RN and son report coughing after thin liquids. Son also reports difficulty chewing solids that are not really soft, due to poor dentition.  Pt presents with generalized weakness, weak cough, low vocal intensity. Trials of thin liquid, thin liquid, puree, and soft solid were tolerated well. No obvious oral difficulty or residue, and no overt s/s aspiration observed on any consistency.   Pt has been seen by ST in the past for swallowing evaluations. Recommendations from December 2019 BSE were for MBS if pt readmitted with PNA. This was recommended to pt's son, who indicated he didn't want to "torture" his father any more, and wanted the opportunity to speak  with his mother and sister regarding the best course of action. RN and MD informed. ST will follow up and proceed in keeping with pt/family wishes.     SLP Visit Diagnosis: Dysphagia, unspecified (R13.10)    Aspiration Risk  Moderate aspiration risk    Diet Recommendation Dysphagia 3 (Mech soft);Thin liquid(pending decisions with family)   Liquid Administration via: Cup;Straw Medication Administration: Whole meds with liquid Supervision: Staff to assist with self feeding;Full supervision/cueing for compensatory strategies Compensations: Slow rate;Small sips/bites;Lingual sweep for clearance of pocketing Postural Changes: Seated upright at 90 degrees;Remain upright for at least 30 minutes after po intake    Other  Recommendations Oral Care Recommendations: Oral care QID Other Recommendations: Have oral suction available   Follow up Recommendations (TBD)      Frequency and Duration min 1 x/week  1 week       Prognosis Prognosis for Safe Diet Advancement: Fair Barriers/Prognosis Comment: deconditioning      Swallow Study   General Date of Onset: 06/30/18 HPI: 85 year old male admitted 06/30/2018 with generalized weakness malaise and fatigue, cough and fever. PMH: DM, Non-Hodgkins Lymphoma, chronic combined heart failure, significant PVD, prostate cancer, s/p renal artery stenting, chronic ITP, CAD s/p stenting, myelodysplastic syndrome, anemia.  Patient was recently discharged on January 8 after admission for acute respiratory failure with hypoxia secondary to acute pulmonary edema in the setting of acute on chronic combined heart failure exac after he received a transfusion for anemia. CXR 1/10 = right upper lobe airspace opacity c/w PNA. Type of Study: Bedside Swallow Evaluation Previous Swallow Assessment: BSE 05/29/18 - reg/thin recommended. Rec MBS if PNA recurrent. Diet Prior to this Study: Regular;Thin liquids Temperature Spikes Noted: Yes Respiratory Status: Nasal  cannula History of Recent Intubation: No Behavior/Cognition: Cooperative;Pleasant mood;Lethargic/Drowsy Oral Cavity Assessment: Within Functional Limits Oral Care Completed by SLP: No Oral Cavity - Dentition: Poor condition;Missing dentition(no dentures per son) Self-Feeding Abilities: Total assist Patient Positioning: Upright in bed Baseline Vocal Quality: Low vocal intensity Volitional Cough: Weak Volitional Swallow: Able to elicit    Oral/Motor/Sensory Function Overall Oral Motor/Sensory Function: Generalized oral weakness   Ice Chips Ice chips: Within functional limits Presentation: Spoon   Thin Liquid Thin Liquid: Within functional limits Presentation: Straw Other Comments: RN and son both report coughing with liquids    Nectar Thick Nectar Thick Liquid: Not tested   Honey Thick Honey Thick Liquid: Not tested   Puree Puree: Within functional limits Presentation: Spoon   Solid     Solid: Within functional limits Other Comments: graham cracker softened with applesauce     Abdulrahman Bracey B. Quentin Ore, [facility], CCC-SLP Speech Language Pathologist [phone]  Shonna Chock 07/04/2018,11:14 AM

## 2018-07-04 NOTE — Progress Notes (Addendum)
[facility] for Infectious Disease    Date of Admission:  06/30/2018   Total days of antibiotics 5(day 2 of mero)          ID: Ricardo Watkins is a 85 y.o. male with   Principal Problem:   Sepsis ([place]) Active Problems:   Essential hypertension   Myelodysplasia (myelodysplastic syndrome) (HCC)   Type 2 diabetes mellitus (HCC)   Chronic systolic CHF (congestive heart failure) (H[facility])   Bacteremia   Bacteremia due to Gram-positive bacteria   HAP (hospital-acquired pneumonia)   Pressure injury of skin    Subjective: He reports feeling better. Constipation resolved. Still having non productive cough and fevers. tmax of 101.65F this am  Medications:  . aspirin EC  81 mg Oral Daily  . budesonide (PULMICORT) nebulizer solution  0.5 mg Nebulization BID  . feeding supplement (ENSURE ENLIVE)  237 mL Oral BID BM  . fluticasone  2 spray Each Nare Daily  . furosemide  20 mg Oral Daily  . gabapentin  100 mg Oral QHS  . guaiFENesin  1,200 mg Oral BID  . insulin aspart  0-5 Units Subcutaneous QHS  . insulin aspart  0-9 Units Subcutaneous TID WC  . insulin glargine  8 Units Subcutaneous Daily  . ipratropium-albuterol  3 mL Nebulization BID  . loratadine  10 mg Oral Daily  . mouth rinse  15 mL Mouth Rinse BID  . metoprolol tartrate  5 mg Intravenous Q6H  . multivitamin with minerals  1 tablet Oral BH-[id number]  . polyethylene glycol  17 g Oral Daily  . pravastatin  40 mg Oral Daily  . tamsulosin  0.4 mg Oral Daily    Objective: Vital signs in last 24 hours: Temp:  [97.6 F (36.4 C)-101.4 F (38.6 C)] 97.6 F (36.4 C) (01/14 1535) Pulse Rate:  [57-126] 112 (01/14 1200) Resp:  [11-32] 27 (01/14 1200) BP: (99-136)/(48-87) 101/51 (01/14 1200) SpO2:  [94 %-100 %] 94 % (01/14 1200) Weight:  [76 kg] 76 kg (01/14 0500) Physical Exam  Constitutional: He is oriented to person, place, and time. He appears his stated age and well-nourished. No distress.  HENT: left eye blindness Mouth/Throat:  Oropharynx is clear and moist. No oropharyngeal exudate.  Cardiovascular: Normal rate, regular rhythm and normal heart sounds. Exam reveals no gallop and no friction rub.  No murmur heard.  Pulmonary/Chest: Effort normal and breath sounds normal. No respiratory distress. He has no wheezes.  Abdominal: Soft. Bowel sounds are normal. He exhibits no distension. There is no tenderness.  Neurological: He is alert and oriented to person, place, and time.  Skin: Skin is warm and dry. No rash noted. No erythema.  Psychiatric: He has a normal mood and affect. His behavior is normal.     Lab Results Recent Labs    07/03/18 0244 07/04/18 0254  WBC 5.2 5.9  HGB 9.0* 9.5*  HCT 30.1* 31.9*  NA 141 142  K 4.6 4.4  CL 103 104  CO2 28 29  BUN 67* 64*  CREATININE 2.18* 2.06*   Microbiology: 1/10 blood cx ngtd 1/6 blood cx ngtd Studies/Results: No results found.   Assessment/Plan: RUL pneumonia vs. Other etiology, concerning for malignancy = continue on meropenem for now. IF he is febrile tomorrow, I would consider getting non contrast chest CT to compare from 4 wk to ago to see if etiology is more consistent with infecitious process vs. Other  For now, continue on meropenem  aki = cr appears slightly  improved  Constipation = resolved  MDS= appears stable  GPR bacteremia with eggethella species = continue on [facility] Cell: [phone] Pager: [phone]  07/04/2018, 4:03 PM

## 2018-07-04 NOTE — Progress Notes (Signed)
  Speech Language Pathology Treatment: (family education)  Patient Details Name: Ricardo Watkins MRN: [id number] DOB: April 06, 1931 Today's Date: 07/04/2018 Time: 1445-1500 SLP Time Calculation (min) (ACUTE ONLY): 15 min  Assessment / Plan / Recommendation Clinical Impression  ST follow up after BSE this morning, per RN request. Wife and daughter present at this time. SLP provided education regarding presentation this morning, and BSE results and recommendations. MBS procedure/process was explained and family's questions were answered. Wife has agreed to [facility] to be completed tomorrow, to objectively assess swallow function and safety and determine least restrictive diet.  MD and RN informed of plan to proceed with MBS next date. Safe swallow precautions were reviewed with family and posted at [facility].    HPI HPI: 85 year old male admitted 06/30/2018 with generalized weakness malaise and fatigue, cough and fever. PMH: DM, Non-Hodgkins Lymphoma, chronic combined heart failure, significant PVD, prostate cancer, s/p renal artery stenting, chronic ITP, CAD s/p stenting, myelodysplastic syndrome, anemia.  Patient was recently discharged on January 8 after admission for acute respiratory failure with hypoxia secondary to acute pulmonary edema in the setting of acute on chronic combined heart failure exac after he received a transfusion for anemia. CXR 1/10 = right upper lobe airspace opacity c/w PNA.      SLP Plan  Continue with current plan of care       Recommendations  Diet recommendations: Dysphagia 3 (mechanical soft);Thin liquid Liquids provided via: Cup;Straw Medication Administration: Whole meds with liquid Supervision: Staff to assist with self feeding;Full supervision/cueing for compensatory strategies Compensations: Slow rate;Small sips/bites;Lingual sweep for clearance of pocketing;Minimize environmental distractions Postural Changes and/or Swallow Maneuvers: Seated upright 90 degrees;Upright  30-60 min after meal                Oral Care Recommendations: Oral care QID Follow up Recommendations: (TBD) SLP Visit Diagnosis: Dysphagia, unspecified (R13.10) Plan: Continue with current plan of care       GO               Demetrus Pavao B. Quentin Ore [facility], CCC-SLP Speech Language Pathologist [phone]  Shonna Chock 07/04/2018, 3:02 PM

## 2018-07-04 NOTE — Plan of Care (Signed)
  Problem: Elimination: Goal: Will not experience complications related to bowel motility Outcome: Progressing   Problem: Pain Managment: Goal: General experience of comfort will improve Outcome: Progressing   Problem: Nutrition: Goal: Adequate nutrition will be maintained Outcome: Progressing   

## 2018-07-04 NOTE — Progress Notes (Signed)
Inpatient Diabetes Program Recommendations  AACE/ADA: New Consensus Statement on Inpatient Glycemic Control (2015)  Target Ranges:  Prepandial:   less than 140 mg/dL      Peak postprandial:   less than 180 mg/dL (1-2 hours)      Critically ill patients:  140 - 180 mg/dL   Results for Ricardo Watkins, Ricardo Watkins (MRN [id number]) as of 07/04/2018 13:26  Ref. Range 07/03/2018 08:32 07/03/2018 11:59 07/03/2018 16:52 07/03/2018 21:28  Glucose-Capillary Latest Ref Range: 70 - 99 mg/dL 169 (H)  2 units NOVOLOG  204 (H)  3 units NOVOLOG +  8 units LANTUS  292 (H)  5 units NOVOLOG  176 (H)   Results for Ricardo Watkins, Ricardo Watkins (MRN [id number]) as of 07/04/2018 13:26  Ref. Range 07/04/2018 07:58 07/04/2018 11:04  Glucose-Capillary Latest Ref Range: 70 - 99 mg/dL 159 (H)  2 units NOVOLOG  264 (H)   5 units NOVOLOG +  8 units LANTUS    Home DM Meds: Lantus 15 units QAM  Current Orders: Lantus 8 units Daily      Novolog Sensitive Correction Scale/ SSI (0-9 units) TID AC + HS      MD- Please consider the following in-hospital insulin adjustments:  1. Increase Lantus slightly to 10 units Daily  2. Increase Novolog SSI to Moderate scale     --Will follow patient during hospitalization--  Wyn Quaker RN, MSN, CDE Diabetes Coordinator Inpatient Glycemic Control Team Team Pager: [phone] (8a-5p)

## 2018-07-05 ENCOUNTER — Inpatient Hospital Stay (HOSPITAL_COMMUNITY): Payer: Medicare Other

## 2018-07-05 LAB — CULTURE, BLOOD (ROUTINE X 2)
Culture: NO GROWTH
Culture: NO GROWTH
Special Requests: ADEQUATE

## 2018-07-05 LAB — CBC WITH DIFFERENTIAL/PLATELET
Abs Immature Granulocytes: 0.32 10*3/uL — ABNORMAL HIGH (ref 0.00–0.07)
Basophils Absolute: 0 10*3/uL (ref 0.0–0.1)
Basophils Relative: 0 %
Eosinophils Absolute: 0 10*3/uL (ref 0.0–0.5)
Eosinophils Relative: 0 %
HCT: 31.2 % — ABNORMAL LOW (ref 39.0–52.0)
Hemoglobin: 9.2 g/dL — ABNORMAL LOW (ref 13.0–17.0)
Immature Granulocytes: 6 %
Lymphocytes Relative: 25 %
Lymphs Abs: 1.4 10*3/uL (ref 0.7–4.0)
MCH: 28 pg (ref 26.0–34.0)
MCHC: 29.5 g/dL — AB (ref 30.0–36.0)
MCV: 95.1 fL (ref 80.0–100.0)
Monocytes Absolute: 1 10*3/uL (ref 0.1–1.0)
Monocytes Relative: 18 %
Neutro Abs: 2.8 10*3/uL (ref 1.7–7.7)
Neutrophils Relative %: 51 %
Platelets: 107 10*3/uL — ABNORMAL LOW (ref 150–400)
RBC: 3.28 MIL/uL — ABNORMAL LOW (ref 4.22–5.81)
RDW: 16.9 % — ABNORMAL HIGH (ref 11.5–15.5)
WBC: 5.6 10*3/uL (ref 4.0–10.5)
nRBC: 0 % (ref 0.0–0.2)

## 2018-07-05 LAB — BASIC METABOLIC PANEL
Anion gap: 10 (ref 5–15)
BUN: 76 mg/dL — ABNORMAL HIGH (ref 8–23)
CO2: 30 mmol/L (ref 22–32)
Calcium: 8.4 mg/dL — ABNORMAL LOW (ref 8.9–10.3)
Chloride: 101 mmol/L (ref 98–111)
Creatinine, Ser: 2.15 mg/dL — ABNORMAL HIGH (ref 0.61–1.24)
GFR calc non Af Amer: 27 mL/min — ABNORMAL LOW (ref >60)
GFR, EST AFRICAN AMERICAN: 31 mL/min — AB (ref >60)
Glucose, Bld: 204 mg/dL — ABNORMAL HIGH (ref 70–99)
Potassium: 5.5 mmol/L — ABNORMAL HIGH (ref 3.5–5.1)
Sodium: 141 mmol/L (ref 135–145)

## 2018-07-05 LAB — POTASSIUM: Potassium: 4.5 mmol/L (ref 3.5–5.1)

## 2018-07-05 LAB — GLUCOSE, CAPILLARY
GLUCOSE-CAPILLARY: 185 mg/dL — AB (ref 70–99)
GLUCOSE-CAPILLARY: 259 mg/dL — AB (ref 70–99)
GLUCOSE-CAPILLARY: 229 mg/dL — AB (ref 70–99)
Glucose-Capillary: 191 mg/dL — ABNORMAL HIGH (ref 70–99)

## 2018-07-05 MED ORDER — INSULIN ASPART 100 UNIT/ML ~~LOC~~ SOLN: 0.0000 [IU] | Freq: Every day | SUBCUTANEOUS | Status: DC

## 2018-07-05 MED ORDER — RESOURCE THICKENUP CLEAR PO POWD
ORAL | Status: DC | PRN
  Filled 2018-07-05 (×2): qty 125

## 2018-07-05 MED ORDER — INSULIN GLARGINE 100 UNIT/ML ~~LOC~~ SOLN
10.0000 [IU] | Freq: Every day | SUBCUTANEOUS | Status: DC
  Administered 2018-07-05 – 2018-07-08 (×4): 10 [IU] via SUBCUTANEOUS
  Filled 2018-07-05 (×4): qty 0.1

## 2018-07-05 MED ORDER — INSULIN ASPART 100 UNIT/ML ~~LOC~~ SOLN
0.0000 [IU] | Freq: Three times a day (TID) | SUBCUTANEOUS | Status: DC
  Administered 2018-07-05: 8 [IU] via SUBCUTANEOUS
  Administered 2018-07-05 – 2018-07-06 (×3): 5 [IU] via SUBCUTANEOUS
  Administered 2018-07-06 – 2018-07-07 (×2): 3 [IU] via SUBCUTANEOUS
  Administered 2018-07-07 (×2): 5 [IU] via SUBCUTANEOUS

## 2018-07-05 NOTE — Progress Notes (Signed)
07/05/2018  1209  Notified MD of patient's SVT HR 168 per tele monitor. Pt's v/s stable. Will continue to monitor.

## 2018-07-05 NOTE — Plan of Care (Signed)
  Problem: Nutrition: Goal: Adequate nutrition will be maintained Outcome: Progressing   Problem: Pain Managment: Goal: General experience of comfort will improve Outcome: Progressing   Problem: Safety: Goal: Ability to remain free from injury will improve Outcome: Progressing   

## 2018-07-05 NOTE — Progress Notes (Signed)
Spoke with Radiology Tech. Pt had barium swallow done today around 9am. Pt cannot have CT until tomorrow (1/16) in order to ensure that all of the barium has cleared. Updated pt and MD.

## 2018-07-05 NOTE — Progress Notes (Signed)
Called for f/u by covering MD re: status of Chest CT. Will call and check in with radiology.

## 2018-07-05 NOTE — Progress Notes (Signed)
**Note De-Identified vi Obfusction** PROGRESS NOTE    Ricardo Watkins  JHE:[id number] DOB: [birth year] DOA: 06/30/2018 PCP: [name], MD    Brief Nrrtive: Per Dr. Tni Ade R Appleis  85 y.o.mlewith medicl history significnt ofchronic combined hert filure,significnt peripherl vsculr disese, remote history of prostte cncer, sttus post renl rtery stenting, chronic ITP, on chronic home O2 CAD sttus post stenting, recently deemed not n intervention cndidte,ndmyelodysplstic syndrome with 5 q. deletion, who getsfrequent hemoglobin checks becuse of nemi.Ptient ws recently dischrged on Jnury 8 fter dmission for cute respirtory filure with hypoxi secondry to cute pulmonry edem in the setting of cute on chronic combined hert filure excfter he received  trnsfusion for nemi  He representsfrom hometody becuse of generlized wekness mlise nd ftigue. Fmily reported tht ptient begn hving worsening cough fever 2 dys go nd PCP prescribed doxycycline nd obtinedblood cultures which returned 1 of 2+ GPR. Fmily reports being progressively more sleepy over the lst dy so they presented to the ER. In the ER ptient ws unble to provide much meningful history but wife/dughter-in-lw reported lethrgy, decrese energy nd inbility to mbulte more thn 2-3 steps with home helth ide tody. As  result they brought him to the ER withhospitl course s follows:  ED Course:Ptient ws febrile to 101.9 blood pressure 104/52 pulse of 109 respirtions of 21 stting 99% on nsl cnnul 4 L. Review of microbiology shows positive blood cultures t 3 dys,  white blood cell count of 4.9 BMP with  mildly elevted potssium t 5.6 nd  CO2 of 30 nd BUN nd cretinine is 76 nd 2.3 which is not fr from ptient's bseline. In the ER he received Vnco nd cefepime lthough cutious fluid dministrtion secondry to ptient's CHF nd rpid decompenstion with  miniml dditionl volume. His lctte ws 1.7   Assessment & Pln:   Principl Problem:   Sepsis ([place]) Active Problems:   [place] hypertension   Myelodysplsi (myelodysplstic syndrome) (HCC)   Type 2 dibetes mellitus (HCC)   Chronic systolic CHF (congestive hert filure) (HCC)   Bcteremi   Bcteremi due to Grm-positive bcteri   HAP (hospitl-cquired pneumoni)   Pressure injury of skin   1 Fever  Sepsis likely secondry to helthcre ssocited pneumoni nd grm-positive bcteremi with eggethell species Recurrent fevers 1/15 CXR with concern for RUL irspce opcity concerning for pneumoni Given persistent fevers, follow CT scn per ID recs (pending) 1/2 blood cx from 1/6 with Eggerthell ctenformis Repet blood cx from 1/10 NGTD Urine cx no growth, u blnd Continue IV meropenem per ID Continue Pulmicort, Flonse, Mucinex, scheduled nebs.   IV fluids hve been sline locked.   Ptient lso noted from CT scn of 06/01/2018 to hve concerns on scn for lung mss/mlignncy.  Fevers my be secondry to mlignncy? Continue current empiric IV ntibiotics.  ID following nd pprecite input nd recommendtions.   Tchycrdi: fluctuting, likely relted to fevers nd suspected infection.  Rte seems better when fevers treted nd when he's relxed when discussing with nursing.  IVF currently on hold nd receiving lsix.  Continue to monitor.  Follow CT scn s noted bove.  Dysphgi: dysphgi 3, nectr thick, thin liquid (tsps thin ok).  Speech following.  S/p brium swllow tody.  2.  Hypertension Continue current regimen of Toprol-XL, Flomx, Lsix.  3.  HFpEF EF 18-56%, grde 2 distolic dysfunction 31/4970 Received IVF on dmission Lst dmission, concern for flsh pulm edem fter trnsfusion His home lsix hs been resumed (he received IV lsix between units **Note De-Identified vi Obfusction** of blood cells during this dmission)  4.  Myelodysplstic syndrome lesion leding to  symptomtic microcytic nemi Hb reltively stble ~9 tody Hemoglobin hd dropped to 7.5 on 07/01/2018 t 4 PM nd pt received 2 units pRBC Outptient f/u with heme, continue to monitor.  5.  Acute respirtory filure with hypoxi Secondry to problem #1.  See problem #1.  6.  Hyperklemi Improved on repet, continue to monitor  7.  Dibetes mellitus type 2 Hemoglobin A1c ws 7.7 on 05/30/2018 Lntus incresed to 10 units. SSI.  8.  Hyperlipidemi Continue sttin.  9.  Chronic kidney disese stge IV Stble.  Avoid nephrotoxic gents.  10.  Chronic thrombocytopeni due to ITP Reltively stble, follow  11.  History of coronry rtery disese/peripherl rtery disese Ptient's Plvix ws discontinued during lst hospitliztion s ptient hs not hd stenting over 10 yers nd due to ptient's chronic thrombocytopeni nd nemi requiring frequent trnsfusions.  Continue spirin, sttin, bet-blocker.  Resumed home dose Lsix.   12.  Constiption Continue current regimen of MirLAX nd Dulcolx suppositories.  Follow.   13.  Stge I pressure injury to buttocks Continue current wound cre.  14.  Gols of cre Per previous provider - Ptient noted with  poor prognosis.  Ptient with history of coronry rtery disese, peripherl rtery disese likely not  cndidte for ny invsive crdic procedures.  Ptient lso with  history of myelodysplstic syndrome with symptomtic microcytic nemi nd ITP requiring trnsfusions.  Ptient ws seen by pllitive cre during lst hospitliztion nd fmily t tht time ws not redy for ny comfort mesures.  Fmily concerned bout ptient's decresed ppetite nd requesting diet to be liberlized.  Ptient's ppetite seems to be improving on liberlize diet which we will continue for now. Will tret reversible things for now nd monitor.  If ptient's condition declines during this hospitliztion will hve pllitive cre  ressess nd meet with fmily for gols of cre.  DVT prophylxis: SCD Code Sttus: DNR Fmily Communiction: dughter, wife t bedside Disposition Pln: pending further improvemetn   Consultnts:   Infectious disese  Procedures:   none  Antimicrobils:  Anti-infectives (From dmission, onwrd)   Strt     Dose/Rte Route Frequency Ordered Stop   07/03/18 1800  meropenem (MERREM) 1 g in sodium chloride 0.9 % 100 mL IVPB     1 g 200 mL/hr over 30 Minutes Intrvenous Every 12 hours 07/03/18 1717     07/03/18 1030  metroNIDAZOLE (FLAGYL) IVPB 500 mg  Sttus:  Discontinued     500 mg 100 mL/hr over 60 Minutes Intrvenous Every 8 hours 07/03/18 1016 07/03/18 1648   07/02/18 1600  vncomycin (VANCOCIN) 1,250 mg in sodium chloride 0.9 % 250 mL IVPB  Sttus:  Discontinued     1,250 mg 166.7 mL/hr over 90 Minutes Intrvenous Every 48 hours 06/30/18 1550 07/03/18 1648   07/02/18 0600  ceFEPIme (MAXIPIME) 1 g in sodium chloride 0.9 % 100 mL IVPB  Sttus:  Discontinued     1 g 200 mL/hr over 30 Minutes Intrvenous Every 24 hours 07/01/18 1221 07/03/18 1701   07/01/18 0600  ceFEPIme (MAXIPIME) 1 g in sodium chloride 0.9 % 100 mL IVPB  Sttus:  Discontinued     1 g 200 mL/hr over 30 Minutes Intrvenous Every 12 hours 06/30/18 1550 07/01/18 1221   06/30/18 2215  vncomycin (VANCOCIN) IVPB 1000 mg/200 mL premix  Sttus:  Discontinued     1,000 mg 200 mL/hr over 60 Minutes Intrvenous  Once 06/30/18  2200 06/30/18 2245   06/30/18 2215  ceFEPIme (MXIPIME) 2 g in sodium chloride 0.9 % 100 mL IVPB  Status:  Discontinued     2 g 200 mL/hr over 30 Minutes Intravenous  Once 06/30/18 2200 06/30/18 2245   06/30/18 1500  vancomycin (VNCOCIN) 1,500 mg in sodium chloride 0.9 % 500 mL IVPB     1,500 mg 250 mL/hr over 120 Minutes Intravenous  Once 06/30/18 1453 06/30/18 1831   06/30/18 1445  ceFEPIme (MXIPIME) 2 g in sodium chloride 0.9 % 100 mL IVPB     2 g 200 mL/hr over 30 Minutes Intravenous   Once 06/30/18 1438 06/30/18 1536        Subjective: Daughter and wife at bedside C/o cough and SOB (started after neb)  Objective: Vitals:   07/05/18 0710 07/05/18 0800 07/05/18 0850 07/05/18 0852  BP: 112/64 (!) 119/38    Pulse: 94 (!) 102    Resp: (!) 28 (!) 27    Temp:  99.7 F (37.6 C)    TempSrc:  xillary    SpO2: 100% 96% 99% 100%  Weight:      Height:        Intake/Output Summary (Last 24 hours) at 07/05/2018 0907 Last data filed at 07/05/2018 0800 Gross per 24 hour  Intake 190.01 ml  Output 825 ml  Net -634.99 ml   Filed Weights   07/02/18 0500 07/03/18 0500 07/04/18 0500  Weight: 77.9 kg 77.4 kg 76 kg    Examination:  General exam: appears fatigued, coughing frequently on neb Respiratory system: Clear to auscultation. Respiratory effort normal. Cardiovascular system: S1 & S2 heard, slightly tachycardic Gastrointestinal system: bdomen is nondistended, soft and nontender.  Central nervous system: lert and oriented.  Extremities: no lee Psychiatry: Judgement and insight appear normal. Mood & affect appropriate.     Data Reviewed: I have personally reviewed following labs and imaging studies  CBC: Recent Labs  Lab 06/30/18 1320 07/01/18 0303 07/01/18 1604 07/02/18 0737 07/03/18 0244 07/04/18 0254 07/05/18 0244  WBC 4.9 5.0  --  4.3 5.2 5.9 5.6  NEUTROBS 1.9  --   --  2.2 2.5 3.2 2.8  HGB 8.8* 8.0* 7.5* 9.2* 9.0* 9.5* 9.2*  HCT 28.7* 27.5* 25.6* 30.6* 30.1* 31.9* 31.2*  MCV 95.0 95.8  --  92.7 92.9 95.5 95.1  PLT 125* 124*  --  90* 100* 113* 182*   Basic Metabolic Panel: Recent Labs  Lab 07/01/18 0303 07/02/18 0737 07/03/18 0244 07/04/18 0254 07/05/18 0244  N 138 138 141 142 141  K 4.4 4.6 4.6 4.4 5.5*  CL 100 101 103 104 101  CO2 25 26 28 29 30   GLUCOSE 134* 219* 172* 177* 204*  BUN 75* 73* 67* 64* 76*  CRETININE 2.30* 2.13* 2.18* 2.06* 2.15*  CLCIUM 8.2* 8.1* 8.3* 8.5* 8.4*   GFR: Estimated Creatinine Clearance: 24.3  mL/min () (by C-G formula based on SCr of 2.15 mg/dL (H)). Liver Function Tests: Recent Labs  Lab 06/30/18 1320  ST 26  LT 22  LKPHOS 62  BILITOT 1.4*  PROT 8.0  LBUMIN 2.0*   No results for input(s): LIPSE, MYLSE in the last 168 hours. Recent Labs  Lab 07/03/18 1222  MMONI 21   Coagulation Profile: Recent Labs  Lab 06/30/18 1320 07/01/18 0303  INR 1.29 1.43   Cardiac Enzymes: No results for input(s): CKTOTL, CKMB, CKMBINDEX, TROPONINI in the last 168 hours. BNP (last 3 results) No results for input(s): PROBNP in the last  8760 hours. HbA1C: No results for input(s): HGBA1C in the last 72 hours. CBG: Recent Labs  Lab 07/04/18 0758 07/04/18 1104 07/04/18 1723 07/04/18 2136 07/05/18 0741  GLUCAP 159* 264* 169* 249* 185*   Lipid Profile: No results for input(s): CHOL, HDL, LDLCALC, TRIG, CHOLHDL, LDLDIRECT in the last 72 hours. Thyroid Function Tests: No results for input(s): TSH, T4TOTAL, FREET4, T3FREE, THYROIDAB in the last 72 hours. Anemia Panel: No results for input(s): VITAMINB12, FOLATE, FERRITIN, TIBC, IRON, RETICCTPCT in the last 72 hours. Sepsis Labs: Recent Labs  Lab 06/30/18 1315 06/30/18 1900 07/01/18 0303  PROCALCITON  --   --  0.39  LATICACIDVEN 1.73 1.16  --     Recent Results (from the past 240 hour(s))  Culture, blood (Routine X 2) w Reflex to ID Panel     Status: None   Collection Time: 06/26/18  4:38 PM  Result Value Ref Range Status   Specimen Description   Final    BLOOD RIGHT HAND Performed at [facility], [place] [address], [place], [place] [address]    Special Requests   Final    BOTTLES DRAWN AEROBIC ONLY Blood Culture adequate volume Performed at [place] [address] [place], [place] [address]    Culture  Setup Time   Final    GRAM POSITIVE RODS AEROBIC BOTTLE ONLY CRITICAL RESULT CALLED TO, READ BACK BY AND VERIFIED WITH: RN CHRISTINA SIX 06/29/18 AT 71 AM BY CM     Culture   Final    Eggerthella catenaformis Standardized susceptibility testing for this organism is not available. Performed at [facility], [place] [address], [place], [place] [address]    Report Status 07/01/2018 FINAL  Final  Culture, blood (Routine X 2) w Reflex to ID Panel     Status: None   Collection Time: 06/26/18  4:45 PM  Result Value Ref Range Status   Specimen Description   Final    BLOOD LEFT ANTECUBITAL Performed at [facility] [address], [place], [place] [address]    Special Requests   Final    BOTTLES DRAWN AEROBIC AND ANAEROBIC Blood Culture adequate volume Performed at [place] [address], [place], [place] [address]    Culture   Final    NO GROWTH 5 DAYS Performed at [facility], [place] [address] [place], [place] [address]    Report Status 07/01/2018 FINAL  Final  Culture, blood (Routine x 2)     Status: None (Preliminary result)   Collection Time: 06/30/18  2:15 PM  Result Value Ref Range Status   Specimen Description BLOOD RIGHT WRIST  Final   Special Requests   Final    BOTTLES DRAWN AEROBIC AND ANAEROBIC Blood Culture results may not be optimal due to an inadequate volume of blood received in culture bottles Performed at [facility], [place] [address] [place], [place] [address]    Culture   Final    NO GROWTH 4 DAYS Performed at [facility], [place] [address] [place], [place] [address]    Report Status PENDING  Incomplete  Culture, blood (Routine x 2)     Status: None (Preliminary result)   Collection Time: 06/30/18  2:30 PM  Result Value Ref Range Status   Specimen Description   Final    BLOOD LEFT ANTECUBITAL Performed at [facility] [address], [place], [place] [address]    Special Requests **Note De-Identified vi Obfusction** Finl    BOTTLES DRAWN AEROBIC AND ANAEROBIC Blood Culture dequte volume Performed t [facility] [address],  [place], [place] [address]    Culture   Finl    NO GROWTH 4 DAYS Performed t [facility], [place] [address] [place], [place] [address]    Report Sttus PENDING  Incomplete  Urine culture     Sttus: None   Collection Time: 06/30/18  5:52 PM  Result Vlue Ref Rnge Sttus   Specimen Description URINE, CLEAN CATCH  Finl   Specil Requests   Finl    Immunocompromised Performed t [place] [address] [place], [place] [address]    Culture NO GROWTH  Finl   Report Sttus 07/02/2018 FINAL  Finl  MRSA PCR Screening     Sttus: None   Collection Time: 07/01/18  9:15 AM  Result Vlue Ref Rnge Sttus   MRSA by PCR NEGATIVE NEGATIVE Finl    Comment:        The GeneXpert MRSA Assy (FDA pproved for NASAL specimens only), is one component of  comprehensive MRSA coloniztion surveillnce progrm. It is not intended to dignose MRSA infection nor to guide or monitor tretment for MRSA infections. Performed t [facility], [place] [address], [place], [place] [address]          Rdiology Studies: No results found.      Scheduled Meds: . spirin EC  81 mg Orl Dily  . budesonide (PULMICORT) nebulizer solution  0.5 mg Nebuliztion BID  . feeding supplement (ENSURE ENLIVE)  237 mL Orl BID BM  . fluticsone  2 spry Ech Nre Dily  . furosemide  20 mg Orl Dily  . gbpentin  100 mg Orl QHS  . guiFENesin  1,200 mg Orl BID  . insulin sprt  0-5 Units Subcutneous QHS  . insulin sprt  0-9 Units Subcutneous TID WC  . insulin glrgine  8 Units Subcutneous Dily  . iprtropium-lbuterol  3 mL Nebuliztion BID  . lortdine  10 mg Orl Dily  . mouth rinse  15 mL Mouth Rinse BID  . metoprolol trtrte  5 mg Intrvenous Q6H  . multivitmin with minerls  1 tblet Orl BH-q7  . polyethylene glycol  17 g Orl Dily  . prvsttin  40 mg Orl Dily  . tmsulosin  0.4 mg Orl Dily   Continuous Infusions: . sodium  chloride Stopped (07/04/18 1727)  . meropenem (MERREM) IV 1 g (07/05/18 0535)     LOS: 5 dys    Time spent: over 30 min    Fyrene Helper, MD [facility] Pger see AMION  If 7PM-7AM, plese contct night-coverge [email or web address] Pssword [email or web address] 07/05/2018, 9:07 AM

## 2018-07-05 NOTE — Progress Notes (Signed)
Nutrition Follow-up  DOCUMENTATION CODES:   Non-severe (moderate) malnutrition in context of chronic illness  INTERVENTION:  - Will d/c Ensure Enlive. - Will order Magic Cup BID with meals, each supplement provides 290 kcal and 9 grams of protein.  - Will order Hormel Shake once/day, this supplement provides 500 kcal and 22 grams of protein. - Continue to encourage PO intakes.    NUTRITION DIAGNOSIS:   Moderate Malnutrition related to chronic illness(MDS) as evidenced by mild fat depletion, mild muscle depletion, percent weight loss. -ongoing  GOAL:   Patient will meet greater than or equal to 90% of their needs -unmet  MONITOR:   PO intake, Supplement acceptance, Labs, Weight trends, Skin, I & O's  ASSESSMENT:   85 y.o. male with medical history significant of chronic combined heart failure, significant peripheral vascular disease, remote history of prostate cancer, status post renal artery stenting, chronic ITP, on chronic home O2 CAD status post stenting, recently deemed not an intervention candidate, and myelodysplastic syndrome with 5 q. deletion, who gets frequent  hemoglobin checks because of anemia.. Admitted for generalized weakness malaise, fatigue and worsening cough.  Weight has been stable throughout admission. Patient was seen by another RD on 1/12 and no intakes documented since that time. Visualized lunch tray with ~25% completion. Wife and daughter are at bedside and patient sleeping at the time of RD visit.   SLP performed MBS earlier today and patient now on Dysphagia 3, nectar-thick liquids (previously on Regular and then Soft diet). Family asks about foods that are appropriate for current diet order and these were reviewed. Family reports that patient has been sleeping much more than usual and that ongoing encouragement is needed for PO intakes. They report that even with encouragement patient has been eating <50% of meals.    Medications reviewed; 20 mg oral  lasix/day, sliding scale novolog, 10 units lantus/day, daily multivitamin with minerals, 1 packet miralax/day. Labs reviewed; CBGs: 185 and 259 mg/dl today, creatinine: 2.15 mg/dl, Ca: 8.4 mg/dl, GFR: 27 ml/min.         Diet Order:   Diet Order            DIET DYS 3 Room service appropriate? Yes; Fluid consistency: Nectar Thick  Diet effective now              EDUCATION NEEDS:   No education needs have been identified at this time  Skin:  Skin Assessment: Skin Integrity Issues: Skin Integrity Issues:: Stage I Stage I: buttocks  Last BM:  1/14  Height:   Ht Readings from Last 1 Encounters:  06/30/18 5\' 8"  (1.727 m)    Weight:   Wt Readings from Last 1 Encounters:  07/04/18 76 kg    Ideal Body Weight:  70 kg  BMI:  Body mass index is 25.48 kg/m.  Estimated Nutritional Needs:   Kcal:  1800-2000  Protein:  85-95g  Fluid:  2L/day     Ricardo Matin, MS, RD, LDN, CNSC Inpatient Clinical Dietitian Pager # [phone] After hours/weekend pager # [phone]

## 2018-07-05 NOTE — Progress Notes (Signed)
07/05/2018  0836 Notified lab [phone] that patient is back in the room so they can come and draw his labs. Per lab they will be here soon.

## 2018-07-05 NOTE — Progress Notes (Signed)
Modified Barium Swallow Progress Note  Patient Details  Name: Ricardo Watkins MRN: [id number] Date of Birth: 10/12/1930  Today's Date: 07/05/2018  Modified Barium Swallow completed.  Full report located under Chart Review in the Imaging Section.  Brief recommendations include the following:  Clinical Impression  Patient presents with moderate oral and mild pharyngeal dysphagia with weakness resulting in decreased oral control and premature spillage of barium into pharynx.  Adequacy and timing of laryngeal elevation & closure impaired thus allowing laryngeal penetration of thin/nectar and aspiration of thin.  Did not test chin tuck posture as pt dysarthric /weak oral control.  Reflexive cough observed only with moderate amount of aspiration and was not effective to clear aspirates.  Tsps of thin were tolerated without aspiration.   Pharyngeal swallow is overall strong without significant residuals with pudding/cracker boluses.  Pharyngeal swallow triggered at vallecular space with cracker after approx. 4 seconds, but he was protective of his airway.  Pt closed his eyes during much of this procedure and leaned his head forward - admitting he was tired when SLP asked.  Recommend dys3/ground meats/nectar liquids and allow thin via tsp.  Anticipate intake to be poor due to pt's level of fatigue.     Swallow Evaluation Recommendations       SLP Diet Recommendations: Dysphagia 3 (Mech soft) solids;Nectar thick liquid;Thin liquid(tsps thin ok)   Liquid Administration via: Spoon;Cup;Straw   Medication Administration: (whole if small, crush if large)   Supervision: Full assist for feeding   Compensations: Minimize environmental distractions;Slow rate;Small sips/bites(start intake with liquid, intermittent dry swallow, rest if coughing excessively)   Postural Changes: Remain semi-upright after after feeds/meals (Comment);Seated upright at 90 degrees   Oral Care Recommendations: Oral care before and  after PO       Luanna Salk, MS [place] Pager [phone] Office [phone]  [name] 07/05/2018,9:21 AM

## 2018-07-05 NOTE — Clinical Social Work Note (Signed)
Clinical Social Work Assessment  Patient Details  Name: Ricardo Watkins MRN: [id number] Date of Birth: [birth year]  Date of referral:  07/05/18               Reason for consult:  Facility Placement                Permission sought to share information with:  Family Supports Permission granted to share information::  Yes, Verbal Permission Granted  Name::     Jaqueline and Animal nutritionist::     Relationship::  Spouse and daughter  Contact Information:     Housing/Transportation Living arrangements for the past 2 months:  Single Family Home Source of Information:  Adult Children, Spouse Patient Interpreter Needed:  None Criminal Activity/Legal Involvement Pertinent to Current Situation/Hospitalization:  No - Comment as needed Significant Relationships:  Adult Children, Spouse Lives with:  Spouse Do you feel safe going back to the place where you live?    Need for family participation in patient care:     Care giving concerns:  Patient lives at home with spouse.  He represents from home today because of generalized weakness malaise and fatigue    Social Worker assessment / plan:  LCSW consulted for facility placement.   LCSW met at bedside with patient. Patient is sedated. Patients spouse, Ricardo Watkins and daughter, Ricardo Watkins are present.   LCSW explained role and reason for visit. Family reports that patient lives at home with spouse. Patient has had home health in the past. Prior to hospitalization patient was receiving blood transfusions inpatient and and the cancer center.   Spouse expressed concerns about patients ability to get transfusions if he goes to SNF. Daughter stated that she was not sure that was still apart of his treatment plan since it has not been working. States ultimately she would like him home. She reports that they have DME at home. Patient has walker, wheelchair and bedside commode. Spouse feels a hospital bed may help.   Spouse would like to think about disposition  and discuss with pt.  PLAN: Disposition unknown.    Employment status:  Retired Forensic scientist:  Medicare PT Recommendations:  [place] / Referral to community resources:     Patient/Family's Response to care:  Family thankful for LCSW visit and coordination with dc planning.   Patient/Family's Understanding of and Emotional Response to Diagnosis, Current Treatment, and Prognosis:  Patients daughter is realistic about patients current condition. Family would like patient home. Family states they would like for him to have a quality of life at home. Family would like to discuss and think about SNF vs. Home health.   Emotional Assessment Appearance:  Appears stated age Attitude/Demeanor/Rapport:  Unable to Assess Affect (typically observed):  Unable to Assess Orientation:  Oriented to Self Alcohol / Substance use:  Not Applicable Psych involvement (Current and /or in the community):  No (Comment)  Discharge Needs  Concerns to be addressed:  No discharge needs identified Readmission within the last 30 days:  Yes Current discharge risk:  None Barriers to Discharge:  Continued Medical Work up   [employer], LCSW 07/05/2018, 3:21 PM

## 2018-07-05 NOTE — Care Plan (Signed)
Patient had CT chest without contrast ordered for today; however, patient had already had a barium swallow study done earlier today. Per my direct supervisor, Debbra Riding, We cannot scan patient until tomorrow in order for all of the barium to be gone from his esophagus.

## 2018-07-05 NOTE — Progress Notes (Signed)
07/05/2018  1700 Notified MD of patient's Temp 102.4 axillary. Will continue to monitor.

## 2018-07-06 ENCOUNTER — Inpatient Hospital Stay: Payer: Medicare Other

## 2018-07-06 ENCOUNTER — Inpatient Hospital Stay (HOSPITAL_COMMUNITY): Payer: Medicare Other

## 2018-07-06 DIAGNOSIS — Y95 Nosocomial condition: Secondary | ICD-10-CM

## 2018-07-06 DIAGNOSIS — R918 Other nonspecific abnormal finding of lung field: Secondary | ICD-10-CM

## 2018-07-06 DIAGNOSIS — J189 Pneumonia, unspecified organism: Secondary | ICD-10-CM

## 2018-07-06 LAB — BASIC METABOLIC PANEL
Anion gap: 6 (ref 5–15)
BUN: 82 mg/dL — ABNORMAL HIGH (ref 8–23)
CO2: 31 mmol/L (ref 22–32)
Calcium: 8.5 mg/dL — ABNORMAL LOW (ref 8.9–10.3)
Chloride: 105 mmol/L (ref 98–111)
Creatinine, Ser: 2.15 mg/dL — ABNORMAL HIGH (ref 0.61–1.24)
GFR calc Af Amer: 31 mL/min — ABNORMAL LOW (ref >60)
GFR calc non Af Amer: 27 mL/min — ABNORMAL LOW (ref >60)
Glucose, Bld: 180 mg/dL — ABNORMAL HIGH (ref 70–99)
Potassium: 5.4 mmol/L — ABNORMAL HIGH (ref 3.5–5.1)
Sodium: 142 mmol/L (ref 135–145)

## 2018-07-06 LAB — HEPATIC FUNCTION PANEL
ALT: 32 U/L (ref 0–44)
AST: 23 U/L (ref 15–41)
Albumin: 1.7 g/dL — ABNORMAL LOW (ref 3.5–5.0)
Alkaline Phosphatase: 61 U/L (ref 38–126)
Bilirubin, Direct: 0.1 mg/dL (ref 0.0–0.2)
Indirect Bilirubin: 0.4 mg/dL (ref 0.3–0.9)
Total Bilirubin: 0.5 mg/dL (ref 0.3–1.2)
Total Protein: 8.2 g/dL — ABNORMAL HIGH (ref 6.5–8.1)

## 2018-07-06 LAB — TSH: TSH: 2.574 u[IU]/mL (ref 0.350–4.500)

## 2018-07-06 LAB — CBC
HCT: 29.8 % — ABNORMAL LOW (ref 39.0–52.0)
Hemoglobin: 8.7 g/dL — ABNORMAL LOW (ref 13.0–17.0)
MCH: 28.5 pg (ref 26.0–34.0)
MCHC: 29.2 g/dL — ABNORMAL LOW (ref 30.0–36.0)
MCV: 97.7 fL (ref 80.0–100.0)
Platelets: 109 10*3/uL — ABNORMAL LOW (ref 150–400)
RBC: 3.05 MIL/uL — ABNORMAL LOW (ref 4.22–5.81)
RDW: 16.9 % — ABNORMAL HIGH (ref 11.5–15.5)
WBC: 5.9 10*3/uL (ref 4.0–10.5)
nRBC: 0 % (ref 0.0–0.2)

## 2018-07-06 LAB — POTASSIUM: Potassium: 5.2 mmol/L — ABNORMAL HIGH (ref 3.5–5.1)

## 2018-07-06 LAB — BLOOD GAS, VENOUS
Acid-Base Excess: 8 mmol/L — ABNORMAL HIGH (ref 0.0–2.0)
Bicarbonate: 32.8 mmol/L — ABNORMAL HIGH (ref 20.0–28.0)
O2 Saturation: 88.1 %
Patient temperature: 98.6
pCO2, Ven: 48.2 mmHg (ref 44.0–60.0)
pH, Ven: 7.447 — ABNORMAL HIGH (ref 7.250–7.430)
pO2, Ven: 53.4 mmHg — ABNORMAL HIGH (ref 32.0–45.0)

## 2018-07-06 LAB — AMMONIA: Ammonia: 20 umol/L (ref 9–35)

## 2018-07-06 LAB — GLUCOSE, CAPILLARY
Glucose-Capillary: 173 mg/dL — ABNORMAL HIGH (ref 70–99)
Glucose-Capillary: 216 mg/dL — ABNORMAL HIGH (ref 70–99)
Glucose-Capillary: 238 mg/dL — ABNORMAL HIGH (ref 70–99)
Glucose-Capillary: 169 mg/dL — ABNORMAL HIGH (ref 70–99)

## 2018-07-06 LAB — FOLATE: FOLATE: 11.2 ng/mL (ref >5.9)

## 2018-07-06 LAB — VITAMIN B12: VITAMIN B 12: 1191 pg/mL — AB (ref 180–914)

## 2018-07-06 LAB — MAGNESIUM: Magnesium: 2.7 mg/dL — ABNORMAL HIGH (ref 1.7–2.4)

## 2018-07-06 MED ORDER — FUROSEMIDE 20 MG PO TABS
20.0000 mg | ORAL_TABLET | Freq: Every day | ORAL | Status: DC
  Administered 2018-07-06: 20 mg via ORAL
  Filled 2018-07-06: qty 1

## 2018-07-06 MED ORDER — METOPROLOL TARTRATE 5 MG/5ML IV SOLN
INTRAVENOUS | Status: AC
  Filled 2018-07-06: qty 5

## 2018-07-06 MED ORDER — GUAIFENESIN 100 MG/5ML PO SOLN
5.0000 mL | ORAL | Status: DC | PRN
  Administered 2018-07-06: 100 mg via ORAL
  Filled 2018-07-06: qty 10

## 2018-07-06 MED ORDER — LIP MEDEX EX OINT
TOPICAL_OINTMENT | CUTANEOUS | Status: AC
  Administered 2018-07-06: 06:00:00
  Filled 2018-07-06: qty 7

## 2018-07-06 MED ORDER — SODIUM CHLORIDE 0.9 % IV SOLN
3.0000 g | Freq: Two times a day (BID) | INTRAVENOUS | Status: DC
  Administered 2018-07-06 – 2018-07-08 (×4): 3 g via INTRAVENOUS
  Filled 2018-07-06 (×5): qty 3

## 2018-07-06 MED ORDER — METOPROLOL TARTRATE 5 MG/5ML IV SOLN
5.0000 mg | Freq: Once | INTRAVENOUS | Status: AC
  Administered 2018-07-06: 5 mg via INTRAVENOUS

## 2018-07-06 NOTE — Progress Notes (Signed)
OT Cancellation Note  Patient Details Name: Ricardo Watkins MRN: [id number] DOB: [birth year]   Cancelled Treatment:    Reason Eval/Treat Not Completed: Other (comment). Spoke to [employer].  Pt not medically ready for OT today, and RN feels he is moving towards full comfort care.  Will sign off at this time.  If pt's status changes, and he would benefit from OT, please reorder.    [place] 07/06/2018, 2:36 PM  [name], OTR/L [facility] [phone] [facility] pager [phone] office 07/06/2018

## 2018-07-06 NOTE — Progress Notes (Signed)
**Note De-Identified vi Obfusction** PROGRESS NOTE    Ricardo Watkins  JEH:[id number] DOB: [birth year] DOA: 06/30/2018 PCP: [name], MD    Brief Nrrtive: Per Dr. Tni Ade R Appleis  85 y.o.mlewith medicl history significnt ofchronic combined hert filure,significnt peripherl vsculr disese, remote history of prostte cncer, sttus post renl rtery stenting, chronic ITP, on chronic home O2 CAD sttus post stenting, recently deemed not n intervention cndidte,ndmyelodysplstic syndrome with 5 q. deletion, who getsfrequent hemoglobin checks becuse of nemi.Ptient ws recently dischrged on Jnury 8 fter dmission for cute respirtory filure with hypoxi secondry to cute pulmonry edem in the setting of cute on chronic combined hert filure excfter he received  trnsfusion for nemi  He representsfrom hometody becuse of generlized wekness mlise nd ftigue. Fmily reported tht ptient begn hving worsening cough fever 2 dys go nd PCP prescribed doxycycline nd obtinedblood cultures which returned 1 of 2+ GPR. Fmily reports being progressively more sleepy over the lst dy so they presented to the ER. In the ER ptient ws unble to provide much meningful history but wife/dughter-in-lw reported lethrgy, decrese energy nd inbility to mbulte more thn 2-3 steps with home helth ide tody. As  result they brought him to the ER withhospitl course s follows:  ED Course:Ptient ws febrile to 101.9 blood pressure 104/52 pulse of 109 respirtions of 21 stting 99% on nsl cnnul 4 L. Review of microbiology shows positive blood cultures t 3 dys,  white blood cell count of 4.9 BMP with  mildly elevted potssium t 5.6 nd  CO2 of 30 nd BUN nd cretinine is 76 nd 2.3 which is not fr from ptient's bseline. In the ER he received Vnco nd cefepime lthough cutious fluid dministrtion secondry to ptient's CHF nd rpid decompenstion with  miniml dditionl volume. His lctte ws 1.7   Assessment & Pln:   Principl Problem:   Sepsis ([place]) Active Problems:   [place] hypertension   Myelodysplsi (myelodysplstic syndrome) (HCC)   Type 2 dibetes mellitus (HCC)   Chronic systolic CHF (congestive hert filure) (HCC)   Bcteremi   Bcteremi due to Grm-positive bcteri   HAP (hospitl-cquired pneumoni)   Pressure injury of skin   1 Fever  Sepsis likely secondry to helthcre ssocited pneumoni nd grm-positive bcteremi with eggethell species Recurrent fevers 1/15 CXR with concern for RUL irspce opcity concerning for pneumoni Given persistent fevers, follow CT scn per ID recs (see report, concerning for mlignncy) 1/2 blood cx from 1/6 with Eggerthell ctenformis Repet blood cx from 1/10 NGTD Urine cx no growth, u blnd Continue IV meropenem per ID Continue Pulmicort, Flonse, Mucinex, scheduled nebs.   IV fluids hve been sline locked.   Ptient lso noted from CT scn of 06/01/2018 to hve concerns on scn for lung mss/mlignncy.   Fevers likely 2/2 mlignncy.  ID following, pprecite recs.  Nrrowed to unsyn (continue through 1/24).  Pulmonry Mss: CT scn concerning for mlignncy.   Pulmonry hs been c/s for dditionl recs, but fmily sounds like they're lening towrds pllitive cre.   Pllitive cre c/s plced. Discussed with [name] s well who greed with pllitive pproch, but noted in pst wife hs been most resistnt to this in pst.  Tchycrdi: fluctuting, likely relted to fevers nd suspected infection.  Rte seems better when fevers treted nd when he's relxed when discussing with nursing.  IVF currently on hold, receiving lsix. He hs IV metop ordered.  Continue to monitor.  Follow CT scn s noted bove.  Acute Encephlopthy: likely 2/2  above, follow TSH, VBG, B12, ammonia.  Dysphagia: dysphagia 3, nectar thick, thin liquid (tsps thin ok).   Speech following.  S/p barium swallow 1/15.  2.  Hypertension Continue current regimen of Toprol-XL, Flomax, (holding lasix).  3.  HFpEF EF 28-76%, grade 2 diastolic dysfunction 81/1572 Received IVF on admission Last admission, concern for flash pulm edema after transfusion His home lasix has been resumed (he received IV lasix between units of blood cells during this admission)  4.  Myelodysplastic syndrome lesion leading to symptomatic microcytic anemia Hb relatively stable ~8.7 today Hemoglobin had dropped to 7.5 on 07/01/2018 at 4 PM and pt received 2 units pRBC Outpatient f/u with heme, continue to monitor.  5.  Acute respiratory failure with hypoxia Secondary to problem #1.  See problem #1.  6.  Hyperkalemia Again improved on repeat, continue to monitor  7.  Diabetes mellitus type 2 Hemoglobin A1c was 7.7 on 05/30/2018 Lantus increased to 10 units. SSI.  Continue to monitor, may need mealtime, follow  8.  Hyperlipidemia Continue statin.  9.  Chronic kidney disease stage IV Stable.  Avoid nephrotoxic agents.  10.  Chronic thrombocytopenia due to ITP Relatively stable, follow  11.  History of coronary artery disease/peripheral artery disease Patient's Plavix was discontinued during last hospitalization as patient has not had stenting over 10 years and due to patient's chronic thrombocytopenia and anemia requiring frequent transfusions.  Continue aspirin, statin, beta-blocker.  Resumed home dose Lasix.   12.  Constipation Continue current regimen of MiraLAX and Dulcolax suppositories.  Follow.   13.  Stage I pressure injury to buttocks Continue current wound care.  14.  Goals of care Planning for palliative care c/s as noted above  DVT prophylaxis: SCD Code Status: DNR Family Communication: daughter, wife at bedside Disposition Plan: pending further improvemetn   Consultants:   Infectious disease  Procedures:   none  Antimicrobials:    Anti-infectives (From admission, onward)   Start     Dose/Rate Route Frequency Ordered Stop   07/03/18 1800  meropenem (MERREM) 1 g in sodium chloride 0.9 % 100 mL IVPB     1 g 200 mL/hr over 30 Minutes Intravenous Every 12 hours 07/03/18 1717     07/03/18 1030  metroNIDAZOLE (FLAGYL) IVPB 500 mg  Status:  Discontinued     500 mg 100 mL/hr over 60 Minutes Intravenous Every 8 hours 07/03/18 1016 07/03/18 1648   07/02/18 1600  vancomycin (VANCOCIN) 1,250 mg in sodium chloride 0.9 % 250 mL IVPB  Status:  Discontinued     1,250 mg 166.7 mL/hr over 90 Minutes Intravenous Every 48 hours 06/30/18 1550 07/03/18 1648   07/02/18 0600  ceFEPIme (MAXIPIME) 1 g in sodium chloride 0.9 % 100 mL IVPB  Status:  Discontinued     1 g 200 mL/hr over 30 Minutes Intravenous Every 24 hours 07/01/18 1221 07/03/18 1701   07/01/18 0600  ceFEPIme (MAXIPIME) 1 g in sodium chloride 0.9 % 100 mL IVPB  Status:  Discontinued     1 g 200 mL/hr over 30 Minutes Intravenous Every 12 hours 06/30/18 1550 07/01/18 1221   06/30/18 2215  vancomycin (VANCOCIN) IVPB 1000 mg/200 mL premix  Status:  Discontinued     1,000 mg 200 mL/hr over 60 Minutes Intravenous  Once 06/30/18 2200 06/30/18 2245   06/30/18 2215  ceFEPIme (MAXIPIME) 2 g in sodium chloride 0.9 % 100 mL IVPB  Status:  Discontinued     2 g 200 mL/hr over 30 Minutes Intravenous **Note De-Identified vi Obfusction** Once 06/30/18 2200 06/30/18 2245   06/30/18 1500  vncomycin (VNCOCIN) 1,500 mg in sodium chloride 0.9 % 500 mL IVPB     1,500 mg 250 mL/hr over 120 Minutes Intrvenous  Once 06/30/18 1453 06/30/18 1831   06/30/18 1445  ceFEPIme (MXIPIME) 2 g in sodium chloride 0.9 % 100 mL IVPB     2 g 200 mL/hr over 30 Minutes Intrvenous  Once 06/30/18 1438 06/30/18 1536        Subjective: Somnolent. wkens to voice nd responds ppropritely. Follows commnds.  Objective: Vitls:   07/06/18 0630 07/06/18 0700 07/06/18 0725 07/06/18 0800  BP:  (!) 118/55    Pulse: (!) 117 (!) 118 70   Resp:  (!) 28 (!) 28 (!) 27   Temp:   98.9 F (37.2 C) 100.1 F (37.8 C)  TempSrc:   xillry xillry  SpO2: 93% 94% 95%   Weight:      Height:        Intke/Output Summry (Lst 24 hours) t 07/06/2018 0904 Lst dt filed t 07/06/2018 0725 Gross per 24 hour  Intke 498.7 ml  Output 650 ml  Net -151.3 ml   Filed Weights   07/02/18 0500 07/03/18 0500 07/04/18 0500  Weight: 77.9 kg 77.4 kg 76 kg    Exmintion:  Generl: No cute distress. Crdiovsculr: Hert sounds show  tchycrdic Lungs: scttered rhonchi bdomen: Soft, nontender, nondistended. Neurologicl: Sleepy, wkens to voice nd follows commnds Skin: Wrm nd dry. No rshes or lesions. Extremities: No clubbing or cynosis. No edem.    Dt Reviewed: I hve personlly reviewed following lbs nd imging studies  CBC: Recent Lbs  Lb 06/30/18 1320  07/02/18 0737 07/03/18 0244 07/04/18 0254 07/05/18 0244 07/06/18 0253  WBC 4.9   < > 4.3 5.2 5.9 5.6 5.9  NEUTROBS 1.9  --  2.2 2.5 3.2 2.8  --   HGB 8.8*   < > 9.2* 9.0* 9.5* 9.2* 8.7*  HCT 28.7*   < > 30.6* 30.1* 31.9* 31.2* 29.8*  MCV 95.0   < > 92.7 92.9 95.5 95.1 97.7  PLT 125*   < > 90* 100* 113* 107* 109*   < > = vlues in this intervl not displyed.   Bsic Metbolic Pnel: Recent Lbs  Lb 07/02/18 0737 07/03/18 0244 07/04/18 0254 07/05/18 0244 07/05/18 0849 07/06/18 0253  N 138 141 142 141  --  142  K 4.6 4.6 4.4 5.5* 4.5 5.4*  CL 101 103 104 101  --  105  CO2 26 28 29 30   --  31  GLUCOSE 219* 172* 177* 204*  --  180*  BUN 73* 67* 64* 76*  --  82*  CRETININE 2.13* 2.18* 2.06* 2.15*  --  2.15*  CLCIUM 8.1* 8.3* 8.5* 8.4*  --  8.5*  MG  --   --   --   --   --  2.7*   GFR: Estimted Cretinine Clernce: 24.3 mL/min () (by C-G formul bsed on SCr of 2.15 mg/dL (H)). Liver Function Tests: Recent Lbs  Lb 06/30/18 1320  ST 26  LT 22  LKPHOS 62  BILITOT 1.4*  PROT 8.0  LBUMIN 2.0*   No results for input(s): LIPSE,  MYLSE in the lst 168 hours. Recent Lbs  Lb 07/03/18 1222  MMONI 21   Cogultion Profile: Recent Lbs  Lb 06/30/18 1320 07/01/18 0303  INR 1.29 1.43   Crdic Enzymes: No results for input(s): CKTOTL, CKMB, CKMBINDEX, TROPONINI in the lst 168 hours.  BNP (last 3 results) No results for input(s): PROBNP in the last 8760 hours. HbA1C: No results for input(s): HGBA1C in the last 72 hours. CBG: Recent Labs  Lab 07/05/18 0741 07/05/18 1136 07/05/18 1635 07/05/18 2134 07/06/18 0752  GLUCAP 185* 259* 229* 191* 173*   Lipid Profile: No results for input(s): CHOL, HDL, LDLCALC, TRIG, CHOLHDL, LDLDIRECT in the last 72 hours. Thyroid Function Tests: No results for input(s): TSH, T4TOTAL, FREET4, T3FREE, THYROIDAB in the last 72 hours. Anemia Panel: No results for input(s): VITAMINB12, FOLATE, FERRITIN, TIBC, IRON, RETICCTPCT in the last 72 hours. Sepsis Labs: Recent Labs  Lab 06/30/18 1315 06/30/18 1900 07/01/18 0303  PROCALCITON  --   --  0.39  LATICACIDVEN 1.73 1.16  --     Recent Results (from the past 240 hour(s))  Culture, blood (Routine X 2) w Reflex to ID Panel     Status: None   Collection Time: 06/26/18  4:38 PM  Result Value Ref Range Status   Specimen Description   Final    BLOOD RIGHT HAND Performed at [facility], [place] [address], [place], [place] [address]    Special Requests   Final    BOTTLES DRAWN AEROBIC ONLY Blood Culture adequate volume Performed at [place] [address] [place], [place] [address]    Culture  Setup Time   Final    GRAM POSITIVE RODS AEROBIC BOTTLE ONLY CRITICAL RESULT CALLED TO, READ BACK BY AND VERIFIED WITH: RN CHRISTINA SIX 06/29/18 AT 44 AM BY CM    Culture   Final    Eggerthella catenaformis Standardized susceptibility testing for this organism is not available. Performed at [facility], [place] [address], [place], [place] [address]    Report Status 07/01/2018  FINAL  Final  Culture, blood (Routine X 2) w Reflex to ID Panel     Status: None   Collection Time: 06/26/18  4:45 PM  Result Value Ref Range Status   Specimen Description   Final    BLOOD LEFT ANTECUBITAL Performed at [place] [address] [place], [place] [address]    Special Requests   Final    BOTTLES DRAWN AEROBIC AND ANAEROBIC Blood Culture adequate volume Performed at [place] [address] [place], [place] [address]    Culture   Final    NO GROWTH 5 DAYS Performed at [facility], [place] [address] [place], [place] [address]    Report Status 07/01/2018 FINAL  Final  Culture, blood (Routine x 2)     Status: None   Collection Time: 06/30/18  2:15 PM  Result Value Ref Range Status   Specimen Description BLOOD RIGHT WRIST  Final   Special Requests   Final    BOTTLES DRAWN AEROBIC AND ANAEROBIC Blood Culture results may not be optimal due to an inadequate volume of blood received in culture bottles Performed at [facility], [place] [address], [place], [place] [address]    Culture   Final    NO GROWTH 5 DAYS Performed at [facility], [place] [address], [place], [place] [address]    Report Status 07/05/2018 FINAL  Final  Culture, blood (Routine x 2)     Status: None   Collection Time: 06/30/18  2:30 PM  Result Value Ref Range Status   Specimen Description   Final    BLOOD LEFT ANTECUBITAL Performed at [place] **Note De-Identified vi Obfusction** [place], [place] [id number]    Specil Requests   Finl    BOTTLES DRAWN AEROBIC AND ANAEROBIC Blood Culture dequte volume Performed t [place] [address], [place], [place] [address]    Culture   Finl    NO GROWTH 5 DAYS Performed t [facility], [place] [address] [place], [place] [address]    Report Sttus 07/05/2018 FINAL  Finl  Urine culture     Sttus: None   Collection Time: 06/30/18  5:52 PM  Result  Vlue Ref Rnge Sttus   Specimen Description URINE, CLEAN CATCH  Finl   Specil Requests   Finl    Immunocompromised Performed t [facility], [place] [address], [place], [place] [address]    Culture NO GROWTH  Finl   Report Sttus 07/02/2018 FINAL  Finl  MRSA PCR Screening     Sttus: None   Collection Time: 07/01/18  9:15 AM  Result Vlue Ref Rnge Sttus   MRSA by PCR NEGATIVE NEGATIVE Finl    Comment:        The GeneXpert MRSA Assy (FDA pproved for NASAL specimens only), is one component of  comprehensive MRSA coloniztion surveillnce progrm. It is not intended to dignose MRSA infection nor to guide or monitor tretment for MRSA infections. Performed t [facility], [place] [address], [place], [place] [address]          Rdiology Studies: Dg Swllowing Func-speech Pthology  Result Dte: 07/06/2018 Objective Swllowing Evlution: Type of Study: MBS-Modified Brium Swllow Study  Ptient Detils Nme: BERTHOLD GLACE MRN: [id number] Dte of Birth: [birth year] Tody's Dte: 07/06/2018 Time: SLP Strt Time (ACUTE ONLY): 0805 -SLP Stop Time (ACUTE ONLY): 0835 SLP Time Clcultion (min) (ACUTE ONLY): 30 min Pst Medicl History: Pst Medicl History: Dignosis Dte . Adenomtous colon polyp  . CAD (coronry rtery disese)   30% LAD Stenosis, 70% rmus intermedius stenosis, treted with PTCA nd ngioplsty by Dr Albertine Ptrici 2004 . Ctrct   right eye . CHF (congestive hert filure) ([place])  . Chronic ITP (idiopthic thrombocytopeni) (HCC)  . Cough, persistent 11/04/2015 . Diverticulosis  . DM (dibetes mellitus) ([place])  . DVT (deep venous thrombosis) ([place])   secondry to surgery . Dyspne  . Fever 05/01/2018 . Hyperlipidemi  . Hypertension  . Inguinl herni   right . Lcertion of finger of right hnd 05/01/2018  INDEX FINGER . Mcrocytic nemi 03/20/2013  Suspect chemo relted MDS . Microcytic nemi 07/07/2015 . Monocytosis 03/20/2013  Suspect  chemo relted MDS . Non Hodgkin's lymphom ([place])  . Pleurl effusion, left 11/04/2015 . Prostte CA ([place]) 09/10/2011  [place] 3+3 R, 3+4 L lobe My 2007 Rx Rdioctive seed implnts Dr Cristel Felt . PVD (peripherl vsculr disese) ([place])   rt renl rtery stent . Renl insufficiency  . Thrombocytopeni ([place])  . 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 Thyroid nodule   left lower lobe (nnul monitoring). . Vitmin D deficiency  Pst Surgicl History: Pst Surgicl History: Procedure Lterlity Dte . APPENDECTOMY    ptient ? Mrlnd Kitchen CARDIAC CATHETERIZATION   . CATARACT EXTRACTION   . CHEST TUBE INSERTION Left 02/10/2016  Procedure: INSERTION PLEURAL DRAINAGE CATHETER;  Surgeon: [name], MD;  Loction: L Grnge;  Service: Thorcic;  Lterlity: Left; . CHEST TUBE INSERTION Left 04/08/2016  Procedure: CHEST TUBE INSERTION;  Surgeon: [name], MD; **Note De-Identified vi Obfusction** Loction: [facility] OR;  Service: Thorcic;  Lterlity: Left; . COLONOSCOPY   . CORONARY ANGIOPLASTY   . CYSTOURETHROSCOPY    ROBOTIC ARM NUCLETRON SEED IMPLANTATION OF PROSTATE . EXPLORATORY LAPAROTOMY    For evlution of lymphom . REMOVAL OF PLEURAL DRAINAGE CATHETER Left 04/08/2016  Procedure: REMOVAL OF PLEURAL DRAINAGE CATHETER;  Surgeon: [name], MD;  Loction: [facility] OR;  Service: Thorcic;  Lterlity: Left; HPI: 70 yer old mle dmitted 06/30/2018 with generlized wekness mlise nd ftigue, cough nd fever. PMH: DM, Non-Hodgkins Lymphom, chronic combined hert filure, significnt PVD, prostte cncer, s/p renl rtery stenting, chronic ITP, CAD s/p stenting, myelodysplstic syndrome, nemi.  Ptient ws recently dischrged on Jnury 8 fter dmission for cute respirtory filure with hypoxi secondry to cute pulmonry edem in the setting of cute on chronic combined hert filure exc fter he received  trnsfusion for nemi. CXR 1/10 =  right upper lobe irspce opcity c/w PNA.  Subjective: pt in chir, sleepy = holding hed down Assessment / Pln / Recommendtion CHL IP CLINICAL IMPRESSIONS 07/05/2018 Clinicl Impression Ptient presents with moderte orl nd mild phryngel dysphgi with wekness resulting in decresed orl control nd premture spillge of brium into phrynx.  Adequcy nd timing of lryngel elevtion & closure impired thus llowing lryngel penetrtion of thin/nectr nd spirtion of thin.  Did not test chin tuck posture s pt dysrthric /wek orl control.  Reflexive cough observed only with moderte mount of spirtion nd ws not effective to cler spirtes.  Tsps of thin were tolerted without spirtion.   Phryngel swllow is overll strong without significnt residuls with pudding/crcker boluses.  Phryngel swllow triggered t vlleculr spce with crcker fter pprox. 4 seconds, but he ws protective of his irwy.  Pt closed his eyes during much of this procedure nd lened his hed forwrd - dmitting he ws tired when SLP sked.  Recommend dys3/ground mets/nectr liquids nd llow thin vi tsp.  Anticipte intke to be poor due to pt's level of ftigue.   SLP Visit Dignosis Dysphgi, orophryngel phse (R13.12) Attention nd concentrtion deficit following -- Frontl lobe nd executive function deficit following -- Impct on sfety nd function Moderte spirtion risk;Risk for indequte nutrition/hydrtion   CHL IP TREATMENT RECOMMENDATION 07/05/2018 Tretment Recommendtions Therpy s outlined in tretment pln below   Prognosis 07/05/2018 Prognosis for Sfe Diet Advncement Fir Brriers to Rech Gols -- Brriers/Prognosis Comment deconditioning CHL IP DIET RECOMMENDATION 07/05/2018 SLP Diet Recommendtions Dysphgi 3 (Mech soft) solids;Nectr thick liquid;Thin liquid Liquid Administrtion vi Spoon;Cup;Strw Mediction Administrtion (No Dt) Compenstions Minimize environmentl  distrctions;Slow rte;Smll sips/bites Posturl Chnges Remin semi-upright fter fter feeds/mels (Comment);Seted upright t 90 degrees   CHL IP OTHER RECOMMENDATIONS 07/05/2018 Recommended Consults -- Orl Cre Recommendtions Orl cre before nd fter PO Other Recommendtions --   CHL IP FOLLOW UP RECOMMENDATIONS 07/04/2018 Follow up Recommendtions (No Dt)   CHL IP FREQUENCY AND DURATION 07/05/2018 Speech Therpy Frequency (ACUTE ONLY) min 1 x/week Tretment Durtion 1 week      CHL IP ORAL PHASE 07/05/2018 Orl Phse Impired Orl - Pudding Tespoon -- Orl - Pudding Cup -- Orl - Honey Tespoon -- Orl - Honey Cup -- Orl - Nectr Tespoon Premture spillge;Reduced posterior propulsion;Wek lingul mnipultion Orl - Nectr Cup Premture spillge;Reduced posterior propulsion;Wek lingul mnipultion Orl - Nectr Strw Premture spillge;Reduced posterior propulsion;Wek lingul mnipultion Orl - Thin Tespoon Premture spillge;Reduced posterior propulsion;Wek lingul mnipultion Orl - Thin Cup Premture spillge;Reduced posterior propulsion;Wek lingul mnipultion Orl - Thin Strw Premture spillge;Reduced posterior propulsion;Wek lingul  manipulation Oral - Puree Premature spillage;Reduced posterior propulsion;Weak lingual manipulation Oral - Mech Soft -- Oral - Regular Premature spillage;Reduced posterior propulsion;Weak lingual manipulation Oral - Multi-Consistency -- Oral - Pill -- Oral Phase - Comment --  CHL IP PHARYNGEAL PHASE 07/05/2018 Pharyngeal Phase Impaired Pharyngeal- Pudding Teaspoon -- Pharyngeal -- Pharyngeal- Pudding Cup -- Pharyngeal -- Pharyngeal- Honey Teaspoon -- Pharyngeal -- Pharyngeal- Honey Cup -- Pharyngeal -- Pharyngeal- Nectar Teaspoon Reduced laryngeal elevation;Reduced airway/laryngeal closure;Penetration/Aspiration during swallow Pharyngeal Material enters airway, remains ABOVE vocal cords and not ejected out Pharyngeal- Nectar Cup Reduced airway/laryngeal  closure;Reduced laryngeal elevation;Penetration/Aspiration during swallow;Pharyngeal residue - valleculae Pharyngeal Material enters airway, remains ABOVE vocal cords and not ejected out Pharyngeal- Nectar Straw Reduced airway/laryngeal closure;Reduced laryngeal elevation;Penetration/Aspiration during swallow Pharyngeal Material enters airway, remains ABOVE vocal cords and not ejected out Pharyngeal- Thin Teaspoon Reduced airway/laryngeal closure;Reduced laryngeal elevation Pharyngeal Material does not enter airway Pharyngeal- Thin Cup Reduced laryngeal elevation;Reduced airway/laryngeal closure;Penetration/Aspiration during swallow Pharyngeal Material enters airway, passes BELOW cords without attempt by patient to eject out (silent aspiration);Material enters airway, passes BELOW cords and not ejected out despite cough attempt by patient Pharyngeal- Thin Straw Reduced airway/laryngeal closure;Reduced laryngeal elevation;Penetration/Aspiration during swallow Pharyngeal Material enters airway, passes BELOW cords without attempt by patient to eject out (silent aspiration);Material enters airway, passes BELOW cords and not ejected out despite cough attempt by patient Pharyngeal- Puree WFL Pharyngeal -- Pharyngeal- Mechanical Soft -- Pharyngeal -- Pharyngeal- Regular Pharyngeal residue - valleculae Pharyngeal -- Pharyngeal- Multi-consistency -- Pharyngeal -- Pharyngeal- Pill -- Pharyngeal -- Pharyngeal Comment --  CHL IP CERVICAL ESOPHAGEAL PHASE 07/05/2018 Cervical Esophageal Phase WFL Pudding Teaspoon -- Pudding Cup -- Honey Teaspoon -- Honey Cup -- Nectar Teaspoon -- Nectar Cup -- Nectar Straw -- Thin Teaspoon -- Thin Cup -- Thin Straw -- Puree -- Mechanical Soft -- Regular -- Multi-consistency -- Pill -- Cervical Esophageal Comment -- [name] 07/06/2018, 7:57 AM  Luanna Salk, MS [facility] SLP Acute Rehab Services Pager [phone] Office [phone]                  Scheduled Meds: . aspirin EC  81  mg Oral Daily  . budesonide (PULMICORT) nebulizer solution  0.5 mg Nebulization BID  . fluticasone  2 spray Each Nare Daily  . gabapentin  100 mg Oral QHS  . guaiFENesin  1,200 mg Oral BID  . insulin aspart  0-15 Units Subcutaneous TID WC  . insulin aspart  0-5 Units Subcutaneous QHS  . insulin glargine  10 Units Subcutaneous Daily  . ipratropium-albuterol  3 mL Nebulization BID  . loratadine  10 mg Oral Daily  . mouth rinse  15 mL Mouth Rinse BID  . metoprolol tartrate  5 mg Intravenous Q6H  . multivitamin with minerals  1 tablet Oral [id number]  . polyethylene glycol  17 g Oral Daily  . pravastatin  40 mg Oral Daily  . tamsulosin  0.4 mg Oral Daily   Continuous Infusions: . sodium chloride 10 mL/hr (07/06/18 0400)  . meropenem (MERREM) IV Stopped (07/06/18 0600)     LOS: 6 days    Time spent: over 30 min    Fayrene Helper, MD [facility] Pager see AMION  If 7PM-7AM, please contact night-coverage [email or web address] Password [email or web address] 07/06/2018, 9:04 AM

## 2018-07-06 NOTE — Progress Notes (Signed)
Pharmacy Antibiotic Note  Ricardo Watkins is a 85 y.o. male admitted on 06/30/2018 with fever, acute respiratory failure, suspected pneumonia, possible bacteremia.  Pharmacy has been consulted for meropenem dosing for HCAP.  07/06/2018 Abx D#8, Tmax 102.4, Scr 2.15, WBC WNL. CT chest 1/16: Dense masslike consolidation replacing much of the right upper lobe and spiculated posterior left upper lobe solid pulmonary nodule, both significantly increased since 06/01/2018 chest CT. A neoplastic process should be considered, such as pulmonary lymphoma given bilateral involvement. A primary bronchogenic carcinoma or prostate cancer metastasis are on the differential but considered less likely. Multilobar pneumonia is considered less likely given the progression from the chest CT performed 1 month prior  Plan:  Continue Meropenem 1 gm IV q12- consider stopping if fevers deemed 2nd malignancy  Follow up renal fxn, culture results, and clinical course.  F/u ability to de-escalate antibiotics.   Height: 5\' 8"  (172.7 cm) Weight: 167 lb 8.8 oz (76 kg) IBW/kg (Calculated) : 68.4  Temp (24hrs), Avg:100.1 F (37.8 C), Min:98.7 F (37.1 C), Max:102.4 F (39.1 C)  Recent Labs  Lab 06/30/18 1315  06/30/18 1900  07/02/18 0737 07/03/18 0244 07/04/18 0254 07/05/18 0244 07/06/18 0253  WBC  --    < >  --    < > 4.3 5.2 5.9 5.6 5.9  CREATININE  --    < >  --    < > 2.13* 2.18* 2.06* 2.15* 2.15*  LATICACIDVEN 1.73  --  1.16  --   --   --   --   --   --    < > = values in this interval not displayed.    Estimated Creatinine Clearance: 24.3 mL/min (A) (by C-G formula based on SCr of 2.15 mg/dL (H)).    Allergies  Allergen Reactions  . Glucophage [Metformin Hydrochloride] Other (See Comments)    Chest pain  . Zetia [Ezetimibe] Other (See Comments)    weakness  . Fenofibrate Rash  . Niacin-Lovastatin Er Rash   Antimicrobials this admission:  (Recent abx:  1/6 Cefepime >> 1/8, 1/6 Vancomycin >>  1/7) 1/10 Vancomycin >> 1/13 1/10 Cefepime >>1/13 1/13 meropenem>> 1/13 Metronidazole >> 1/13   Dose adjustments this admission:    Microbiology results: 1/10 BCx: ngF 1/10 UCx:  ngF 1/11 MRSA PCR: negative 1/11 flu panel: negative   Previous admission: 12/14 BCx: NGF 12/15 BCx: NGF 1/4 MRSA PCR: negative 1/6 BCx: 1/4 GPR (aerobic bottle only), NG x4 d - Eggerthella catenaformis  Thank you for allowing pharmacy to be a part of this patient's care.  Eudelia Bunch, Pharm.D [phone] 07/06/2018 10:14 AM

## 2018-07-06 NOTE — Progress Notes (Signed)
[facility] for Infectious Disease    Date of Admission:  06/30/2018   Total days of antibiotics 7           ID: Ricardo Watkins is a 85 y.o. male with   Principal Problem:   Sepsis ([place]) Active Problems:   Essential hypertension   Myelodysplasia (myelodysplastic syndrome) (HCC)   Type 2 diabetes mellitus (HCC)   Chronic systolic CHF (congestive heart failure) (HCC)   Bacteremia   Bacteremia due to Gram-positive bacteria   HAP (hospital-acquired pneumonia)   Pressure injury of skin    Subjective: Still febrile, poor oral intake today, per his wife. Less conversive today  Medications:  . aspirin EC  81 mg Oral Daily  . budesonide (PULMICORT) nebulizer solution  0.5 mg Nebulization BID  . fluticasone  2 spray Each Nare Daily  . gabapentin  100 mg Oral QHS  . insulin aspart  0-15 Units Subcutaneous TID WC  . insulin aspart  0-5 Units Subcutaneous QHS  . insulin glargine  10 Units Subcutaneous Daily  . ipratropium-albuterol  3 mL Nebulization BID  . loratadine  10 mg Oral Daily  . mouth rinse  15 mL Mouth Rinse BID  . metoprolol tartrate  5 mg Intravenous Q6H  . multivitamin with minerals  1 tablet Oral BH-q7a  . polyethylene glycol  17 g Oral Daily  . pravastatin  40 mg Oral Daily  . tamsulosin  0.4 mg Oral Daily    Objective: Vital signs in last 24 hours: Temp:  [98.7 F (37.1 C)-101.4 F (38.6 C)] 99.7 F (37.6 C) (01/16 1545) Pulse Rate:  [53-128] 61 (01/16 1600) Resp:  [18-39] 18 (01/16 1600) BP: (112-137)/(43-101) 119/58 (01/16 1600) SpO2:  [91 %-100 %] 98 % (01/16 1600) Physical Exam  Constitutional: He is oriented to person. He appears older than stated age and mal-nourished. No distress.  HENT:  Mouth/Throat: dry, Oropharynx is clear. No oropharyngeal exudate.  Cardiovascular: Normal rate, regular rhythm and normal heart sounds. Exam reveals no gallop and no friction rub.  No murmur heard.  Pulmonary/Chest: Effort normal. Breath sounds are decrease  right upper lung field. No respiratory distress. He has no wheezes.  Abdominal: Soft. Bowel sounds are normal. He exhibits no distension. There is no tenderness.  Skin: Skin is warm and dry. No rash noted. No erythema.     Lab Results Recent Labs    07/05/18 0244  07/06/18 0253 07/06/18 0837  WBC 5.6  --  5.9  --   HGB 9.2*  --  8.7*  --   HCT 31.2*  --  29.8*  --   NA 141  --  142  --   K 5.5*   < > 5.4* 5.2*  CL 101  --  105  --   CO2 30  --  31  --   BUN 76*  --  82*  --   CREATININE 2.15*  --  2.15*  --    < > = values in this interval not displayed.   Liver Panel Recent Labs    07/06/18 0837  PROT 8.2*  ALBUMIN 1.7*  AST 23  ALT 32  ALKPHOS 61  BILITOT 0.5  BILIDIR 0.1  IBILI 0.4    Microbiology: 1/10 blood cx ngtd 1/6 blood cx 1 of 2 sets - Eggerthella catenaformis  Studies/Results: Ct Chest Wo Contrast  Result Date: 07/06/2018 CLINICAL DATA:  Inpatient. Fever of unknown origin. History of prostate cancer and non-Hodgkin's lymphoma. EXAM:  CT CHEST WITHOUT CONTRAST TECHNIQUE: Multidetector CT imaging of the chest was performed following the standard protocol without IV contrast. COMPARISON:  06/01/2018 chest CT.  06/30/2018 chest radiograph. FINDINGS: Cardiovascular: Normal heart size. Stable trace pericardial effusion/thickening. Three-vessel coronary atherosclerosis. Atherosclerotic nonaneurysmal thoracic aorta. Normal caliber pulmonary arteries. Mediastinum/Nodes: Stable hypodense 2.1 cm inferior left thyroid lobe nodule. Unremarkable esophagus. No axillary adenopathy. Mild right paratracheal adenopathy up to 1.4 cm (series 2/image 46), mildly increased from 1.2 cm on 06/01/2018 chest CT. Stable enlarged 1.6 cm subcarinal node (series 2/image 72). Enlarged 1.6 cm lower left para-aortic node (series 2/image 93) is stable). No discrete hilar adenopathy on these noncontrast images. Lungs/Pleura: No pneumothorax. Small dependent right pleural effusion. No significant  left pleural effusion. Moderate compressive atelectasis in the dependent right lower lobe. Dense masslike consolidation in the right upper lobe measures 12.0 x 10.1 cm (series 5/image 44) with surrounding patchy consolidation and ground-glass opacity, significantly increased from 6.9 x 6.7 cm on 06/01/2018 chest CT. Spiculated 1.7 cm posterior left upper lobe solid pulmonary nodule (series 5/image 59), increased from 1.1 cm. Patchy central ground-glass opacity throughout both lungs. Upper abdomen: Stable scattered granulomatous liver calcifications. Nonobstructing 3 mm upper right renal stone. Musculoskeletal: No aggressive appearing focal osseous lesions. Moderate thoracic spondylosis. IMPRESSION: 1. Dense masslike consolidation replacing much of the right upper lobe and spiculated posterior left upper lobe solid pulmonary nodule, both significantly increased since 06/01/2018 chest CT. A neoplastic process should be considered, such as pulmonary lymphoma given bilateral involvement. A primary bronchogenic carcinoma or prostate cancer metastasis are on the differential but considered less likely. Multilobar pneumonia is considered less likely given the progression from the chest CT performed 1 month prior. 2. Nonspecific mild mediastinal lymphadenopathy, mildly increased, can not exclude lymphoma or metastatic disease. 3. Small dependent right pleural effusion. 4. Three-vessel coronary atherosclerosis. Aortic Atherosclerosis (ICD10-I70.0). Electronically Signed   By: Ilona Sorrel M.D.   On: 07/06/2018 10:25   Dg Swallowing Func-speech Pathology  Result Date: 07/06/2018 Objective Swallowing Evaluation: Type of Study: MBS-Modified Barium Swallow Study  Patient Details Name: Ricardo Watkins MRN: [id number] Date of Birth: 02-13-1931 Today's Date: 07/06/2018 Time: SLP Start Time (ACUTE ONLY): 0805 -SLP Stop Time (ACUTE ONLY): 0835 SLP Time Calculation (min) (ACUTE ONLY): 30 min Past Medical History: Past Medical History:  Diagnosis Date . Adenomatous colon polyp  . CAD (coronary artery disease)   30% LAD Stenosis, 70% ramus intermedius stenosis, treated with PTCA and angioplasty by Dr Albertine Patricia 2004 . Cataract   right eye . CHF (congestive heart failure) ([place])  . Chronic ITP (idiopathic thrombocytopenia) (HCC)  . Cough, persistent 11/04/2015 . Diverticulosis  . DM (diabetes mellitus) ([place])  . DVT (deep venous thrombosis) ([place])   secondary to surgery . Dyspnea  . Fever 05/01/2018 . Hyperlipidemia  . Hypertension  . Inguinal hernia   right . Laceration of finger of right hand 05/01/2018  INDEX FINGER . Macrocytic anemia 03/20/2013  Suspect chemo related MDS . Microcytic anemia 07/07/2015 . Monocytosis 03/20/2013  Suspect chemo related MDS . Non Hodgkin's lymphoma ([place])  . Pleural effusion, left 11/04/2015 . Prostate CA ([place]) 09/10/2011  Gleason 3+3 R, 3+4 L lobe May 2007 Rx Radioactive seed implants Dr [name] . PVD (peripheral vascular disease) ([place])   rt renal artery stent . Renal insufficiency  . Thrombocytopenia ([place])  . 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  Thyroid nodule   left lower lobe (annual monitoring). . Vitamin D deficiency  Past Surgical History: Past Surgical History: Procedure Laterality Date . APPENDECTOMY    patient ? Marland Kitchen CARDIAC CATHETERIZATION   . CATARACT EXTRACTION   . CHEST TUBE INSERTION Left 02/10/2016  Procedure: INSERTION PLEURAL DRAINAGE CATHETER;  Surgeon: [name], MD;  Location: [facility];  Service: Thoracic;  Laterality: Left; . CHEST TUBE INSERTION Left 04/08/2016  Procedure: CHEST TUBE INSERTION;  Surgeon: [name], MD;  Location: [facility];  Service: Thoracic;  Laterality: Left; . COLONOSCOPY   . CORONARY ANGIOPLASTY   . CYSTOURETHROSCOPY    ROBOTIC ARM NUCLETRON SEED IMPLANTATION OF PROSTATE . EXPLORATORY LAPAROTOMY    For evaluation of lymphoma . REMOVAL OF PLEURAL DRAINAGE CATHETER  Left 04/08/2016  Procedure: REMOVAL OF PLEURAL DRAINAGE CATHETER;  Surgeon: [name], MD;  Location: [facility];  Service: Thoracic;  Laterality: Left; HPI: 85 year old male admitted 06/30/2018 with generalized weakness malaise and fatigue, cough and fever. PMH: DM, Non-Hodgkins Lymphoma, chronic combined heart failure, significant PVD, prostate cancer, s/p renal artery stenting, chronic ITP, CAD s/p stenting, myelodysplastic syndrome, anemia.  Patient was recently discharged on January 8 after admission for acute respiratory failure with hypoxia secondary to acute pulmonary edema in the setting of acute on chronic combined heart failure exac after he received a transfusion for anemia. CXR 1/10 = right upper lobe airspace opacity c/w PNA.  Subjective: pt in chair, sleepy = holding head down Assessment / Plan / Recommendation CHL IP CLINICAL IMPRESSIONS 07/05/2018 Clinical Impression Patient presents with moderate oral and mild pharyngeal dysphagia with weakness resulting in decreased oral control and premature spillage of barium into pharynx.  Adequacy and timing of laryngeal elevation & closure impaired thus allowing laryngeal penetration of thin/nectar and aspiration of thin.  Did not test chin tuck posture as pt dysarthric /weak oral control.  Reflexive cough observed only with moderate amount of aspiration and was not effective to clear aspirates.  Tsps of thin were tolerated without aspiration.   Pharyngeal swallow is overall strong without significant residuals with pudding/cracker boluses.  Pharyngeal swallow triggered at vallecular space with cracker after approx. 4 seconds, but he was protective of his airway.  Pt closed his eyes during much of this procedure and leaned his head forward - admitting he was tired when SLP asked.  Recommend dys3/ground meats/nectar liquids and allow thin via tsp.  Anticipate intake to be poor due to pt's level of fatigue.   SLP Visit Diagnosis Dysphagia, oropharyngeal phase  (R13.12) Attention and concentration deficit following -- Frontal lobe and executive function deficit following -- Impact on safety and function Moderate aspiration risk;Risk for inadequate nutrition/hydration   CHL IP TREATMENT RECOMMENDATION 07/05/2018 Treatment Recommendations Therapy as outlined in treatment plan below   Prognosis 07/05/2018 Prognosis for Safe Diet Advancement Fair Barriers to Reach Goals -- Barriers/Prognosis Comment deconditioning CHL IP DIET RECOMMENDATION 07/05/2018 SLP Diet Recommendations Dysphagia 3 (Mech soft) solids;Nectar thick liquid;Thin liquid Liquid Administration via Spoon;Cup;Straw Medication Administration (No Data) Compensations Minimize environmental distractions;Slow rate;Small sips/bites Postural Changes Remain semi-upright after after feeds/meals (Comment);Seated upright at 90 degrees   CHL IP OTHER RECOMMENDATIONS 07/05/2018 Recommended Consults -- Oral Care Recommendations Oral care before and after PO Other Recommendations --   CHL IP FOLLOW UP RECOMMENDATIONS 07/04/2018 Follow up Recommendations (No Data)   CHL IP FREQUENCY AND DURATION 07/05/2018 Speech Therapy Frequency (ACUTE ONLY) min 1 x/week Treatment Duration 1 week      CHL  IP ORAL PHASE 07/05/2018 Oral Phase Impaired Oral - Pudding Teaspoon -- Oral - Pudding Cup -- Oral - Honey Teaspoon -- Oral - Honey Cup -- Oral - Nectar Teaspoon Premature spillage;Reduced posterior propulsion;Weak lingual manipulation Oral - Nectar Cup Premature spillage;Reduced posterior propulsion;Weak lingual manipulation Oral - Nectar Straw Premature spillage;Reduced posterior propulsion;Weak lingual manipulation Oral - Thin Teaspoon Premature spillage;Reduced posterior propulsion;Weak lingual manipulation Oral - Thin Cup Premature spillage;Reduced posterior propulsion;Weak lingual manipulation Oral - Thin Straw Premature spillage;Reduced posterior propulsion;Weak lingual manipulation Oral - Puree Premature spillage;Reduced posterior  propulsion;Weak lingual manipulation Oral - Mech Soft -- Oral - Regular Premature spillage;Reduced posterior propulsion;Weak lingual manipulation Oral - Multi-Consistency -- Oral - Pill -- Oral Phase - Comment --  CHL IP PHARYNGEAL PHASE 07/05/2018 Pharyngeal Phase Impaired Pharyngeal- Pudding Teaspoon -- Pharyngeal -- Pharyngeal- Pudding Cup -- Pharyngeal -- Pharyngeal- Honey Teaspoon -- Pharyngeal -- Pharyngeal- Honey Cup -- Pharyngeal -- Pharyngeal- Nectar Teaspoon Reduced laryngeal elevation;Reduced airway/laryngeal closure;Penetration/Aspiration during swallow Pharyngeal Material enters airway, remains ABOVE vocal cords and not ejected out Pharyngeal- Nectar Cup Reduced airway/laryngeal closure;Reduced laryngeal elevation;Penetration/Aspiration during swallow;Pharyngeal residue - valleculae Pharyngeal Material enters airway, remains ABOVE vocal cords and not ejected out Pharyngeal- Nectar Straw Reduced airway/laryngeal closure;Reduced laryngeal elevation;Penetration/Aspiration during swallow Pharyngeal Material enters airway, remains ABOVE vocal cords and not ejected out Pharyngeal- Thin Teaspoon Reduced airway/laryngeal closure;Reduced laryngeal elevation Pharyngeal Material does not enter airway Pharyngeal- Thin Cup Reduced laryngeal elevation;Reduced airway/laryngeal closure;Penetration/Aspiration during swallow Pharyngeal Material enters airway, passes BELOW cords without attempt by patient to eject out (silent aspiration);Material enters airway, passes BELOW cords and not ejected out despite cough attempt by patient Pharyngeal- Thin Straw Reduced airway/laryngeal closure;Reduced laryngeal elevation;Penetration/Aspiration during swallow Pharyngeal Material enters airway, passes BELOW cords without attempt by patient to eject out (silent aspiration);Material enters airway, passes BELOW cords and not ejected out despite cough attempt by patient Pharyngeal- Puree WFL Pharyngeal -- Pharyngeal- Mechanical Soft --  Pharyngeal -- Pharyngeal- Regular Pharyngeal residue - valleculae Pharyngeal -- Pharyngeal- Multi-consistency -- Pharyngeal -- Pharyngeal- Pill -- Pharyngeal -- Pharyngeal Comment --  CHL IP CERVICAL ESOPHAGEAL PHASE 07/05/2018 Cervical Esophageal Phase WFL Pudding Teaspoon -- Pudding Cup -- Honey Teaspoon -- Honey Cup -- Nectar Teaspoon -- Nectar Cup -- Nectar Straw -- Thin Teaspoon -- Thin Cup -- Thin Straw -- Puree -- Mechanical Soft -- Regular -- Multi-consistency -- Pill -- Cervical Esophageal Comment -- [name] 07/06/2018, 7:57 AM  Luanna Salk, MS [facility] SLP Acute Rehab Services Pager [phone] Office [phone]               Assessment/Plan:  ongoing fevers = suggestive that this is non infectious and likely due malignancy  Pulmonary mass = CT more suggestive of malignancy rather infection  eggerthella bacteremia(anaerobic, GPR infection)  = will narrow abtx to amp/sub- plan to treat through1/24  dispo = likely poor outcome due to pulmonary mass thought to be malignancy. Agree with the plan to consult palliative care, and discuss with pulmonary and gorsuch, his oncologist.  [facility] for Infectious Diseases Cell: [phone] Pager: [phone]  07/06/2018, 4:38 PM

## 2018-07-06 NOTE — Progress Notes (Signed)
Pharmacy - Brief Note  See pharmacist's note from earlier today.  Rounding with Dr Baxter Flattery, changed meropenem to ampicillin/sulbactam to cover eggerthella bacteremia.    Plan:  Based on current renal function,  Start ampicillin/sulbactam 3gm IV q12h  Pharmacy to adjust as needed per renal fx  Doreene Eland, PharmD, BCPS.   Work Cell: [phone] 07/06/2018 4:25 PM

## 2018-07-06 NOTE — Consult Note (Addendum)
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 Darcey Nora.  More recently his course has been complicated by recurrent infections and chf and requirement for transfusions because of bone marrow failure.  He was admitted on January 10 with 2 days of cough and fever and generalized weakness. He was felt to have likely sepsis with bacteremia and infectious disease has been seeing him in guiding his antibiotic therapy.       STUDIES:   CT chest w/o contrast 2022-07-19: Dense masslike consolidation replacing much of the right upper lobe and spiculated posterior left upper lobe solid pulmonary nodule, both significantly increased since 06/01/2018 chest CT. A neoplastic process should be considered, such as pulmonary lymphoma given bilateral involvement. A primary bronchogenic carcinoma or prostate cancer metastasis are on the differential but considered less likely. Multilobar pneumonia is considered less likely given the progression from the chest CT performed 1 month prior. 2. Nonspecific mild mediastinal lymphadenopathy, mildly increased, can not exclude lymphoma or metastatic disease. 3. Small  dependent right pleural effusion. My review:  Prominent air bronchograms in RUL post segment        SUBJECTIVE:  Denies cp, sob on 4lpm NP   CONSTITUTIONAL: BP (!) 120/45   Pulse (!) 107   Temp 100.1 F (37.8 C) (Axillary)   Resp (!) 23   Ht 5\' 8"  (1.727 m)   Wt 76 kg   SpO2 96%   BMI 25.48 kg/m   I/O last 3 completed shifts: In: 503.2 [P.O.:90; I.V.:213.2; IV Piggyback:200] Out: 1150 [Urine:1150]        PHYSICAL EXAM: General:  Chronically ill appearing wm somnolent but will resp to verbal with yes / no answers Neuro:  No focal motor def HEENT:  orophx mucosa dry  Cardiovascular:  RRR no s3 Lungs:  Decreased bs / minimal rhonchi s bronchial changes Abdomen:  Soft/ benign Musculoskeletal:  Muscle wasting Skin:  No rash    ASSESSMENT AND PLAN   1)  RUL consolidation c/w lymphoma vs bronchogenic ca vs infection /organizing pna with fever pattern more c/w malignancy than unaddressed infection - air bronchograms on most recent ct mean the airway is patent here ie this is not a post obst issue - ideally need tissue dx here but fm very reluctant when hearing that it's very unlikely to help even if we did offer a better chance for a specific dx and they are leaning toward full comfort care/ ncb status approp here  2) chronic resp failure/ hypoxemia/ well compensated on 02 NP -rec 02 to maintain sats > 90%    ID following for ? Pna, nothing to offer in this regard.  Discussed with fm:  Clearly the high likelihood of prolonging suffering from ccm  interventions vastly outweighs any reasonable chance of benefit from offering anything else because medical science has done all it can here to restore health.  Therefore  I don't have any additional recs  except to consider hospice sooner rather than later - paradoxically many patients with respiratory diseases live longer and better once a palliative approach is used in this setting.     Christinia Gully, MD Pulmonary and  [place] [phone] After 5:30 PM or weekends, use [facility] [phone]         LABS  Glucose Recent Labs  Lab 07/05/18 0741 07/05/18 1136 07/05/18 1635 07/05/18 2134 07/06/18 0752 07/06/18 1218  GLUCAP 185* 259* 229* 191* 173* 216*    BMET Recent Labs  Lab 07/04/18 0254 07/05/18 0244 07/05/18 0849 07/06/18 0253 07/06/18 0837  NA 142 141  --  142  --   K 4.4 5.5* 4.5 5.4* 5.2*  CL 104 101  --  105  --   CO2 29 30  --  31  --   BUN 64* 76*  --  82*  --   CREATININE 2.06* 2.15*  --  2.15*  --   GLUCOSE 177* 204*  --  180*  --     Liver Enzymes Recent Labs  Lab 06/30/18 1320 07/06/18 0837  AST 26 23  ALT 22 32  ALKPHOS 62 61  BILITOT 1.4* 0.5  ALBUMIN 2.0* 1.7*    Electrolytes Recent Labs  Lab 07/04/18 0254 07/05/18 0244 07/06/18 0253  CALCIUM 8.5* 8.4* 8.5*  MG  --   --  2.7*    CBC Recent Labs  Lab 07/04/18 0254 07/05/18 0244 07/06/18 0253  WBC 5.9 5.6 5.9  HGB 9.5* 9.2* 8.7*  HCT 31.9* 31.2* 29.8*  PLT 113* 107* 109*    ABG No results for input(s): PHART, PCO2ART, PO2ART in the last 168 hours.  Coag's Recent Labs  Lab 06/30/18 1320 07/01/18 0303  INR 1.29 1.43    Sepsis Markers Recent Labs  Lab 06/30/18 1315 06/30/18 1900 07/01/18 0303  LATICACIDVEN 1.73 1.16  --   PROCALCITON  --   --  0.39    Cardiac Enzymes No results for input(s): TROPONINI, PROBNP in the last 168 hours.  PAST MEDICAL HISTORY :   He  has a past medical history of Adenomatous colon polyp, CAD (coronary artery disease), Cataract, CHF (congestive heart failure) ([place]), Chronic ITP (idiopathic thrombocytopenia) ([facility]), Cough, persistent (11/04/2015), Diverticulosis, DM (diabetes mellitus) ([place]), DVT (deep venous thrombosis) ([place]), Dyspnea, Fever (05/01/2018), Hyperlipidemia, Hypertension, Inguinal hernia, Laceration of finger of right hand (05/01/2018), Macrocytic anemia (03/20/2013), Microcytic anemia (07/07/2015),  Monocytosis (03/20/2013), Non Hodgkin's lymphoma ([place]), Pleural effusion, left (11/04/2015), Prostate CA ([place]) (09/10/2011), PVD (peripheral vascular disease) ([place]), Renal insufficiency, Thrombocytopenia ([place]), Thrombotic stroke ([place]) (09/10/2011), Thyroid nodule, and Vitamin D deficiency.  PAST SURGICAL HISTORY:  He  has a past surgical history that includes Cystourethroscopy; Exploratory laparotomy; Colonoscopy; Appendectomy; Cardiac catheterization; Coronary angioplasty; Chest tube insertion (Left, 02/10/2016); Removal of pleural drainage catheter (Left, 04/08/2016); Chest tube insertion (Left, 04/08/2016); and Cataract extraction.  Allergies  Allergen Reactions  . Glucophage [Metformin Hydrochloride] Other (See Comments)    Chest pain  . Zetia [Ezetimibe] Other (See Comments)    weakness  . Fenofibrate Rash  . Niacin-Lovastatin Er Rash    No current facility-administered medications on file prior to encounter.    Current Outpatient Medications on File Prior to Encounter  Medication Sig  . acetaminophen (TYLENOL) 500 MG tablet Take 1,000 mg by mouth every 6 (six) hours as needed for mild pain, moderate pain or headache.   . ALPRAZolam (XANAX) 0.25 MG tablet Take 1 tablet (0.25 mg total) by mouth 2 (two) times daily as needed for anxiety or sleep (agitation).  Marland Kitchen aspirin 81 MG EC tablet Take 1 tablet (81 mg total) by mouth daily with breakfast.  . diphenhydrAMINE-Phenylephrine (DELSYM NIGHT CGH/COLD CHILDREN) 6.25-2.5 MG/5ML LIQD Take 10 mLs by mouth as needed (cough related symptoms).  Marland Kitchen doxycycline (VIBRA-TABS) 100 MG tablet Take 1 tablet (100 mg total) by mouth 2 (two) times daily.  . feeding supplement, ENSURE ENLIVE, (ENSURE ENLIVE) LIQD Take 237 mLs by mouth 2 (two) times daily between meals.  . furosemide (LASIX) 20 MG tablet Take 20 mg by mouth daily.  Marland Kitchen gabapentin (NEURONTIN) 100 MG capsule TAKE 1 CAPSULE BY MOUTH AT BEDTIME (Patient taking differently: Take 100 mg by mouth at bedtime.  )  . Insulin Glargine (LANTUS SOLOSTAR) 100 UNIT/ML Solostar Pen Inject 15 Units into the skin at bedtime. INJECT 20 UNITS EVERY NIGHT AT BEDTIME (Patient taking differently: Inject 15 Units into the skin every morning. )  . loperamide (IMODIUM) 2 MG capsule Take 2 mg by mouth as needed for diarrhea or loose stools.  . meclizine (ANTIVERT) 12.5 MG tablet Take 1 tablet (12.5 mg total) by mouth 3 (three) times daily as needed for dizziness.  . metoprolol succinate (TOPROL-XL) 50 MG 24 hr tablet Take 1 tablet (50 mg total) by mouth daily. Take with or immediately following a meal.  . Multiple Vitamin (MULTIVITAMIN WITH MINERALS) TABS tablet Take 1 tablet by mouth every morning.   . nitroGLYCERIN (NITROSTAT) 0.4 MG SL tablet Place 1 tablet (0.4 mg total) under the tongue every 5 (five) minutes as needed for chest pain.  . NON FORMULARY Inject 1 Dose into the vein once a week. Platelet booster  . ondansetron (ZOFRAN) 4 MG tablet Take 1 tablet (4 mg total) by mouth every 6 (six) hours as needed for nausea or vomiting.  Marland Kitchen OVER THE COUNTER MEDICATION Place 1 drop into both eyes daily as needed (dry eyes). Over the counter lubricating eye drop  . polyethylene glycol (MIRALAX / GLYCOLAX) packet Take 17 g by mouth daily.  . pravastatin (PRAVACHOL) 40 MG tablet TAKE 1 TABLET BY MOUTH ONCE A DAY AT [facility] THE EVENING. (Patient taking differently: Take 40 mg by mouth daily. At 6 pm)  . tamsulosin (FLOMAX) 0.4 MG CAPS capsule TAKE 1 CAPSULE BY MOUTH ONCE DAILY (Patient taking differently: Take 0.4 mg by mouth daily. )  . traMADol (ULTRAM) 50 MG tablet Take 1 tablet (50 mg total) by mouth every 6 (six) hours as needed for moderate pain.    FAMILY HISTORY:   His family history includes Asthma in his son; Breast cancer in his sister; Dementia in his father; Diabetes in his brother and sister; Heart disease in his brother; Lymphoma in his sister; Prostate cancer in his brother.  SOCIAL HISTORY:  He  reports that he  quit smoking about 60 years ago. His smoking use included pipe and cigarettes. He has a 10.00 pack-year smoking history. He has never used smokeless tobacco. He reports that he does not drink alcohol or use drugs.

## 2018-07-06 NOTE — Plan of Care (Signed)
  Problem: Pain Managment: Goal: General experience of comfort will improve Outcome: Progressing   Problem: Safety: Goal: Ability to remain free from injury will improve Outcome: Progressing   Problem: Elimination: Goal: Will not experience complications related to urinary retention Outcome: Progressing

## 2018-07-07 DIAGNOSIS — Z515 Encounter for palliative care: Secondary | ICD-10-CM

## 2018-07-07 DIAGNOSIS — Z7189 Other specified counseling: Secondary | ICD-10-CM

## 2018-07-07 LAB — COMPREHENSIVE METABOLIC PANEL
ALT: 29 U/L (ref 0–44)
AST: 21 U/L (ref 15–41)
Albumin: 1.8 g/dL — ABNORMAL LOW (ref 3.5–5.0)
Alkaline Phosphatase: 58 U/L (ref 38–126)
Anion gap: 9 (ref 5–15)
BUN: 83 mg/dL — ABNORMAL HIGH (ref 8–23)
CO2: 31 mmol/L (ref 22–32)
Calcium: 8.5 mg/dL — ABNORMAL LOW (ref 8.9–10.3)
Chloride: 106 mmol/L (ref 98–111)
Creatinine, Ser: 2.26 mg/dL — ABNORMAL HIGH (ref 0.61–1.24)
GFR calc Af Amer: 30 mL/min — ABNORMAL LOW (ref >60)
GFR calc non Af Amer: 25 mL/min — ABNORMAL LOW (ref >60)
Glucose, Bld: 164 mg/dL — ABNORMAL HIGH (ref 70–99)
Potassium: 5.2 mmol/L — ABNORMAL HIGH (ref 3.5–5.1)
Sodium: 146 mmol/L — ABNORMAL HIGH (ref 135–145)
Total Bilirubin: 0.8 mg/dL (ref 0.3–1.2)
Total Protein: 8.5 g/dL — ABNORMAL HIGH (ref 6.5–8.1)

## 2018-07-07 LAB — CBC
HCT: 29.8 % — ABNORMAL LOW (ref 39.0–52.0)
Hemoglobin: 8.6 g/dL — ABNORMAL LOW (ref 13.0–17.0)
MCH: 28.4 pg (ref 26.0–34.0)
MCHC: 28.9 g/dL — ABNORMAL LOW (ref 30.0–36.0)
MCV: 98.3 fL (ref 80.0–100.0)
PLATELETS: 103 10*3/uL — AB (ref 150–400)
RBC: 3.03 MIL/uL — AB (ref 4.22–5.81)
RDW: 16.9 % — ABNORMAL HIGH (ref 11.5–15.5)
WBC: 6.4 10*3/uL (ref 4.0–10.5)
nRBC: 0 % (ref 0.0–0.2)

## 2018-07-07 LAB — GLUCOSE, CAPILLARY
GLUCOSE-CAPILLARY: 241 mg/dL — AB (ref 70–99)
Glucose-Capillary: 163 mg/dL — ABNORMAL HIGH (ref 70–99)
Glucose-Capillary: 249 mg/dL — ABNORMAL HIGH (ref 70–99)

## 2018-07-07 LAB — MAGNESIUM: Magnesium: 2.7 mg/dL — ABNORMAL HIGH (ref 1.7–2.4)

## 2018-07-07 MED ORDER — GLYCOPYRROLATE 0.2 MG/ML IJ SOLN: 0.2000 mg | Freq: Four times a day (QID) | INTRAMUSCULAR | Status: DC | PRN

## 2018-07-07 MED ORDER — LORAZEPAM 2 MG/ML IJ SOLN: 0.5000 mg | Freq: Four times a day (QID) | INTRAMUSCULAR | Status: DC | PRN

## 2018-07-07 MED ORDER — METOPROLOL TARTRATE 5 MG/5ML IV SOLN
5.0000 mg | Freq: Once | INTRAVENOUS | Status: AC
  Administered 2018-07-07: 5 mg via INTRAVENOUS
  Filled 2018-07-07: qty 5

## 2018-07-07 MED ORDER — DEXTROSE-NACL 5-0.45 % IV SOLN
INTRAVENOUS | Status: AC
  Administered 2018-07-07 – 2018-07-08 (×2): via INTRAVENOUS

## 2018-07-07 MED ORDER — OXYCODONE HCL 20 MG/ML PO CONC
5.0000 mg | ORAL | Status: DC | PRN
  Administered 2018-07-07: 5 mg via ORAL
  Administered 2018-07-08: 10 mg via ORAL
  Filled 2018-07-07 (×2): qty 1

## 2018-07-07 NOTE — Progress Notes (Signed)
Orders obtained

## 2018-07-07 NOTE — Consult Note (Signed)
Consultation Note Date: 07/07/2018   Patient Name: Ricardo Watkins  DOB: 12-17-1930  MRN: [id number]  Age / Sex: 85 y.o., male  PCP: [name], MD Referring Physician: Elodia Florence., *  Reason for Consultation: Establishing goals of care, Non pain symptom management and Psychosocial/spiritual support  HPI/Patient Profile:  85 y.o. male  with past medical history of MDS, CKD3, HF, PVD s/p renal stenting, prostate cancer s/p radiation, and CAD not appropriate for invasive therapies, he has a right upper lobe lung mass of uncertain etiology and was found to have bacteremia and pneumonia.  His clinical course has continued to decline and palliative consulted for goals of care.  Clinical Assessment and Goals of Care:  I have reviewed medical records including EPIC notes, labs and imaging, received report from the care team, assessed the patient and met with his family, including his wife Kennyth Lose) and his children and grandchildren to discuss diagnosis prognosis, GOC, EOL wishes, disposition and options.  I introduced Palliative Medicine as specialized medical care for people living with serious illness. It focuses on providing relief from the symptoms and stress of a serious illness. The goal is to improve quality of life for both the patient and the family.  I discussed values and goals of care important to the patient.  Kennyth Lose took care of both of her parents at home as they died.  She is hopeful for the same for her husband.  Family express that Kennyth Lose has reached a point where she accepts that [place] is dying and they are supportive of her desire to take him home.  We discussed clinical course as well as wishes moving forward with understanding that his condition is worsening.  We discussed difference between a aggressive medical intervention path and a palliative, comfort focused care path.  Family  is clear that goal is to work to transition home with hospice as soon as possible.  Concept of Hospice and Palliative Care were discussed  Questions and concerns addressed.   PMT will continue to support holistically.  Primary Decision Maker:  PATIENT    SUMMARY OF RECOMMENDATIONS   - Patient's wife is clear that she would like to transition home with hospice support.  We discussed care plan moving forward at length.  She feels strongly about the following:  -She does not want him to get morphine (she is ok with other opioids, including oxycodone which I started today)  - She is struggling with stopping his insulin.  Will continue lantus but stop mealtime checks and correction  - He cannot have a foley catheter due to bladder hernia  - Would recommend continue his Flomax (as he has retention history and cannot have foley) - Consult placed for case management.  Family is hopeful to transition home with [facility] tomorrow if possible   Code Status/Advance Care Planning: DNR / DNI.     Symptom Management:   Added oxycodone as needed for pain and SOB  Prognosis:  Likely days to < 2 weeks.  Discharge  Planning: Home with Hospice      Primary Diagnoses: Present on Admission: . Sepsis ([place]) . Bacteremia due to Gram-positive bacteria . HAP (hospital-acquired pneumonia) . Myelodysplasia (myelodysplastic syndrome) ([place]) . Chronic systolic CHF (congestive heart failure) ([place]) . Essential hypertension . Bacteremia   I have reviewed the medical record, interviewed the patient and family, and examined the patient. The following aspects are pertinent.  Past Medical History:  Diagnosis Date  . Adenomatous colon polyp   . CAD (coronary artery disease)    30% LAD Stenosis, 70% ramus intermedius stenosis, treated with PTCA and angioplasty by Dr Albertine Patricia 2004  . Cataract    right eye  . CHF (congestive heart failure) ([place])   . Chronic ITP (idiopathic thrombocytopenia) ([facility])   . Cough,  persistent 11/04/2015  . Diverticulosis   . DM (diabetes mellitus) ([place])   . DVT (deep venous thrombosis) ([place])    secondary to surgery  . Dyspnea   . Fever 05/01/2018  . Hyperlipidemia   . Hypertension   . Inguinal hernia    right  . Laceration of finger of right hand 05/01/2018   INDEX FINGER  . 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 Microcytic anemia 07/07/2015  . 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 Non Hodgkin's lymphoma ([place])   . Pleural effusion, left 11/04/2015  . Prostate CA ([place]) 09/10/2011   Gleason 3+3 R, 3+4 L lobe May 2007 Rx Radioactive seed implants Dr [name]  . PVD (peripheral vascular disease) ([place])    rt renal artery stent  . Renal insufficiency   . Thrombocytopenia ([place])   . Thrombotic stroke ([place]) 09/10/2011   January 18, 2011 infarct genu & post limb R internal capsule - acute; previous lacunar infarcts/extensive white matter dis  . Thyroid nodule    left lower lobe (annual monitoring).  . Vitamin D deficiency    Social History   Socioeconomic History  . Marital status: Married    Spouse name: Not on file  . Number of children: 4  . Years of education: Not on file  . Highest education level: Not on file  Occupational History  . Occupation: retired  Scientific laboratory technician  . Financial resource strain: Not on file  . Food insecurity:    Worry: Not on file    Inability: Not on file  . Transportation needs:    Medical: Not on file    Non-medical: Not on file  Tobacco Use  . Smoking status: Former Smoker    Packs/day: 0.50    Years: 20.00    Pack years: 10.00    Types: Pipe, Cigarettes    Last attempt to quit: 06/21/1958    Years since quitting: 60.0  . Smokeless tobacco: Never Used  Substance and Sexual Activity  . Alcohol use: No    Alcohol/week: 0.0 standard drinks  . Drug use: No  . Sexual activity: Yes    Partners: Female  Lifestyle  . Physical activity:    Days per week: Not on file    Minutes per session:  Not on file  . Stress: Not on file  Relationships  . Social connections:    Talks on phone: Not on file    Gets together: Not on file    Attends religious service: Not on file    Active member of club or organization: Not on file    Attends meetings of clubs or organizations: Not on file    Relationship  status: Not on file  Other Topics Concern  . Not on file  Social History Narrative  . Not on file   Family History  Problem Relation Age of Onset  . Dementia Father   . Lymphoma Sister   . Prostate cancer Brother   . Breast cancer Sister   . Diabetes Brother   . Diabetes Sister   . Heart disease Brother   . Asthma Son    Scheduled Meds: . aspirin EC  81 mg Oral Daily  . budesonide (PULMICORT) nebulizer solution  0.5 mg Nebulization BID  . fluticasone  2 spray Each Nare Daily  . gabapentin  100 mg Oral QHS  . insulin glargine  10 Units Subcutaneous Daily  . ipratropium-albuterol  3 mL Nebulization BID  . loratadine  10 mg Oral Daily  . mouth rinse  15 mL Mouth Rinse BID  . metoprolol tartrate  5 mg Intravenous Q6H  . multivitamin with minerals  1 tablet Oral [id number]  . polyethylene glycol  17 g Oral Daily  . pravastatin  40 mg Oral Daily  . tamsulosin  0.4 mg Oral Daily   Continuous Infusions: . sodium chloride 10 mL/hr at 07/07/18 0610  . ampicillin-sulbactam (UNASYN) IV 200 mL/hr at 07/07/18 1800  . dextrose 5 % and 0.45% NaCl Stopped (07/07/18 1758)   PRN Meds:.sodium chloride, acetaminophen **OR** acetaminophen, albuterol, ALPRAZolam, bisacodyl, glycopyrrolate, guaiFENesin, HYDROcodone-homatropine, loperamide, LORazepam, ondansetron **OR** ondansetron (ZOFRAN) IV, oxyCODONE, RESOURCE THICKENUP CLEAR, traMADol, traZODone Allergies  Allergen Reactions  . Glucophage [Metformin Hydrochloride] Other (See Comments)    Chest pain  . Zetia [Ezetimibe] Other (See Comments)    weakness  . Fenofibrate Rash  . Niacin-Lovastatin Er Rash   Review of Systems  Unable to  obtain  Physical Exam  Gen Somnolent.  CV Tachy with murmur resp Increased work of breathing at rest. Tachypnic. On supplemental oxygen Abdomen soft, nt, nd Ext: No significant ede,a   Vital Signs: BP (!) 139/53   Pulse (!) 122   Temp 99.3 F (37.4 C) (Oral)   Resp (!) 24   Ht 5' 8" (1.727 m)   Wt 76 kg   SpO2 96%   BMI 25.48 kg/m  Pain Scale: 0-10   Pain Score: Asleep   SpO2: SpO2: 96 % O2 Device:SpO2: 96 % O2 Flow Rate: .O2 Flow Rate (L/min): 4 L/min  IO: Intake/output summary:   Intake/Output Summary (Last 24 hours) at 07/07/2018 2214 Last data filed at 07/07/2018 1800 Gross per 24 hour  Intake 870.61 ml  Output -  Net 870.61 ml    LBM: Last BM Date: 07/05/18(per report) Baseline Weight: Weight: 76.7 kg Most recent weight: Weight: 76 kg     Palliative Assessment/Data: 30%   Flowsheet Rows     Most Recent Value  Intake Tab  Referral Department  Hospitalist  Unit at Time of Referral  ICU  Palliative Care Primary Diagnosis  Cardiac  Date Notified  07/06/18  Palliative Care Type  Return patient Palliative Care  Reason for referral  Clarify Goals of Care  Date of Admission  06/30/18  # of days IP prior to Palliative referral  6  Clinical Assessment  Psychosocial & Spiritual Assessment  Palliative Care Outcomes      Time In: 1700 Time Out: 1820 Time Total: 80 min Greater than 50%  of this time was spent counseling and coordinating care related to the above assessment and plan.  Signed by: Micheline Rough, MD [place] Team [phone]  Please contact Palliative Medicine Team phone at [phone] for questions and concerns.  For individual provider: See Shea Evans

## 2018-07-07 NOTE — Care Management Note (Signed)
Case Management Note  Patient Details  Name: MASIN SHATTO MRN: [id number] Date of Birth: Apr 16, 1931  Subjective/Objective:                  Return to top of Pneumonia RRG - [place]  Discharge readiness is indicated by patient meeting Recovery Milestones, including ALL of the following: ? Hemodynamic stability 102/44 ? Tachypnea absent  resp rate=39 ? Hypoxemia absent on o2 via [place] ? Afebrile, or temperature acceptable for next level of care no temp 101.4 ? Oxygen absent or at baseline need  No  ? Mental status at baseline yes ? Antibiotic regimen acceptable for next level of care iv unasyn,  ? Ambulatory no ? Oral hydration, medications, and diet iv d51/2ns at 68ml/hr Level of care sdu   Action/Plan: Following for progression of care and condition. Following for cm needs none present at this time.  Expected Discharge Date:  (unknown)               Expected Discharge Plan:     In-House Referral:     Discharge planning Services     Post Acute Care Choice:    Choice offered to:     DME Arranged:    DME Agency:     HH Arranged:    HH Agency:     Status of Service:     If discussed at [employer], dates discussed:    Additional Comments:  Leeroy Cha, RN 07/07/2018, 9:59 AM

## 2018-07-07 NOTE — Progress Notes (Signed)
PROGRESS NOTE    Ricardo Watkins  ZOX:[id number] DOB: 05/12/1931 DOA: 06/30/2018 PCP: [name], MD    Brief Narrative: Per Dr. Tania Ade R Appleis Aryam Zhan 85 y.o.malewith medical history significant ofchronic combined heart failure,significant peripheral vascular disease, remote history of prostate cancer, status post renal artery stenting, chronic ITP, on chronic home O2 CAD status post stenting, recently deemed not an intervention candidate,andmyelodysplastic syndrome with 5 q. deletion, who getsfrequent hemoglobin checks because of anemia.Patient was recently discharged on January 8 after admission for acute respiratory failure with hypoxia secondary to acute pulmonary edema in the setting of acute on chronic combined heart failure exacafter he received Braidyn Scorsone transfusion for anemia  He representsfrom hometoday because of generalized weakness malaise and fatigue. Family reported that patient began having worsening cough fever 2 days ago and PCP prescribed doxycycline and obtainedblood cultures which returned 1 of 2+ GPR. Family reports being progressively more sleepy over the last day so they presented to the ER. In the ER patient was unable to provide much meaningful history but wife/daughter-in-law reported lethargy, decrease energy and inability to ambulate more than 2-3 steps with home health aide today. As Christl Fessenden result they brought him to the ER withhospital course as follows:  ED Course:Patient was febrile to 101.9 blood pressure 104/52 pulse of 109 respirations of 21 satting 99% on nasal cannula 4 L. Review of microbiology shows positive blood cultures at 3 days, Laportia Carley white blood cell count of 4.9 BMP with Lalo Tromp mildly elevated potassium at 5.6 and Christopherjohn Schiele CO2 of 30 and BUN and creatinine is 76 and 2.3 which is not far from patient's baseline. In the ER he received Vanco and cefepime although cautious fluid administration secondary to patient's CHF and rapid decompensation with  minimal additional volume. His lactate was 1.7   Assessment & Plan:   Principal Problem:   Sepsis ([place]) Active Problems:   Essential hypertension   Myelodysplasia (myelodysplastic syndrome) (HCC)   Type 2 diabetes mellitus (HCC)   Chronic systolic CHF (congestive heart failure) (HCC)   Bacteremia   Bacteremia due to Gram-positive bacteria   HAP (hospital-acquired pneumonia)   Pressure injury of skin   1 Fever  Sepsis likely secondary to healthcare associated pneumonia and gram-positive bacteremia with eggethella species Recurrent fevers 1/15 CXR with concern for RUL airspace opacity concerning for pneumonia Given persistent fevers, follow CT scan per ID recs (see report, concerning for malignancy) 1/2 blood cx from 1/6 with Eggerthella catenaformis Repeat blood cx from 1/10 NGTD Urine cx no growth, ua bland Continue IV meropenem per ID Continue Pulmicort, Flonase, Mucinex, scheduled nebs.   IV fluids have been saline locked.   Patient also noted from CT scan of 06/01/2018 to have concerns on scan for lung mass/malignancy.   Fevers likely 2/2 malignancy.  ID following, appreciate recs.  Narrowed to unasyn (continue through 1/24).  Acute Hypoxic Respiratory Failure  Tachycardia:  2/2 pulmonary mass, fevers Continuing abx for bacteremia noted above Appreciate pulm recs as noted below IV metop ordered  Pulmonary Mass: CT scan concerning for malignancy.   Pulmonary has been c/s for additional recs, appreciate recs - interventions likely outweight benefit, recommending palliative care (see note) Discussed with [name] as well who agreed with palliative approach, but noted in past wife has been most resistant to this in past.  Acute Encephalopathy: likely 2/2 above, he's Lesley Galentine&Ox3, but overall sleepy. follow TSH, VBG, B12, ammonia (wnl).  Dysphagia: dysphagia 3, nectar thick, thin liquid (tsps thin ok).  Speech following.  S/p barium swallow 1/15.  2.  Hypertension Continue  current regimen of Toprol-XL, Flomax, (holding lasix).  3.  HFpEF EF 54-56%, grade 2 diastolic dysfunction 25/6389 Received IVF on admission Last admission, concern for flash pulm edema after transfusion Holding lasix  4.  Myelodysplastic syndrome lesion leading to symptomatic microcytic anemia Hb relatively stable ~8.6 today Hemoglobin had dropped to 7.5 on 07/01/2018 at 4 PM and pt received 2 units pRBC Outpatient f/u with heme, continue to monitor.  5.  Acute respiratory failure with hypoxia Secondary to problem #1.  See problem #1.  6.  Hyperkalemia Mild, follow  7.  Diabetes mellitus type 2 Hemoglobin A1c was 7.7 on 05/30/2018 Lantus increased to 10 units. SSI.  Continue to monitor, may need mealtime, follow  8.  Hyperlipidemia Continue statin.  9.  Chronic kidney disease stage IV Relatively stable.  Avoid nephrotoxic agents, add fluids with hypernatremia below  # Hypernatremia: pt dry oral mucosa, started on d5 1/2 NS  10.  Chronic thrombocytopenia due to ITP Relatively stable, follow  11.  History of coronary artery disease/peripheral artery disease Patient's Plavix was discontinued during last hospitalization as patient has not had stenting over 10 years and due to patient's chronic thrombocytopenia and anemia requiring frequent transfusions.  Continue aspirin, statin, beta-blocker.  Holding lasix.  12.  Constipation Continue current regimen of MiraLAX and Dulcolax suppositories.  Follow.   13.  Stage I pressure injury to buttocks Continue current wound care.  14.  Goals of care Planning for palliative care c/s as noted above  DVT prophylaxis: SCD Code Status: DNR Family Communication: daughter at bedside Disposition Plan: pending further improvemetn   Consultants:   Infectious disease  Procedures:   none  Antimicrobials:  Anti-infectives (From admission, onward)   Start     Dose/Rate Route Frequency Ordered Stop   07/06/18 1800   Ampicillin-Sulbactam (UNASYN) 3 g in sodium chloride 0.9 % 100 mL IVPB     3 g 200 mL/hr over 30 Minutes Intravenous Every 12 hours 07/06/18 1543     07/03/18 1800  meropenem (MERREM) 1 g in sodium chloride 0.9 % 100 mL IVPB  Status:  Discontinued     1 g 200 mL/hr over 30 Minutes Intravenous Every 12 hours 07/03/18 1717 07/06/18 1541   07/03/18 1030  metroNIDAZOLE (FLAGYL) IVPB 500 mg  Status:  Discontinued     500 mg 100 mL/hr over 60 Minutes Intravenous Every 8 hours 07/03/18 1016 07/03/18 1648   07/02/18 1600  vancomycin (VANCOCIN) 1,250 mg in sodium chloride 0.9 % 250 mL IVPB  Status:  Discontinued     1,250 mg 166.7 mL/hr over 90 Minutes Intravenous Every 48 hours 06/30/18 1550 07/03/18 1648   07/02/18 0600  ceFEPIme (MAXIPIME) 1 g in sodium chloride 0.9 % 100 mL IVPB  Status:  Discontinued     1 g 200 mL/hr over 30 Minutes Intravenous Every 24 hours 07/01/18 1221 07/03/18 1701   07/01/18 0600  ceFEPIme (MAXIPIME) 1 g in sodium chloride 0.9 % 100 mL IVPB  Status:  Discontinued     1 g 200 mL/hr over 30 Minutes Intravenous Every 12 hours 06/30/18 1550 07/01/18 1221   06/30/18 2215  vancomycin (VANCOCIN) IVPB 1000 mg/200 mL premix  Status:  Discontinued     1,000 mg 200 mL/hr over 60 Minutes Intravenous  Once 06/30/18 2200 06/30/18 2245   06/30/18 2215  ceFEPIme (MAXIPIME) 2 g in sodium chloride 0.9 % 100 mL IVPB  Status:  Discontinued     2 g 200 mL/hr over 30 Minutes Intravenous  Once 06/30/18 2200 06/30/18 2245   06/30/18 1500  vancomycin (VANCOCIN) 1,500 mg in sodium chloride 0.9 % 500 mL IVPB     1,500 mg 250 mL/hr over 120 Minutes Intravenous  Once 06/30/18 1453 06/30/18 1831   06/30/18 1445  ceFEPIme (MAXIPIME) 2 g in sodium chloride 0.9 % 100 mL IVPB     2 g 200 mL/hr over 30 Minutes Intravenous  Once 06/30/18 1438 06/30/18 1536        Subjective: Somnolent Granville Whitefield&Ox3. Denies pain  Objective: Vitals:   07/07/18 0500 07/07/18 0519 07/07/18 0600 07/07/18 0804  BP:  (!)  121/56 (!) 118/54   Pulse: (!) 120 (!) 105 (!) 116   Resp: 12 (!) 29 (!) 27   Temp:    98.5 F (36.9 C)  TempSrc:    Axillary  SpO2: 95% 96% 97%   Weight:      Height:        Intake/Output Summary (Last 24 hours) at 07/07/2018 0831 Last data filed at 07/07/2018 0610 Gross per 24 hour  Intake 325.63 ml  Output -  Net 325.63 ml   Filed Weights   07/02/18 0500 07/03/18 0500 07/04/18 0500  Weight: 77.9 kg 77.4 kg 76 kg    Examination:  General: No acute distress. Cardiovascular: Heart sounds show Alexiya Franqui tachy rate Lungs: coarse breath sounds, tachypneic Abdomen: Soft, nontender, nondistended  Neurological: Alert and oriented 3, somnolent. Moves all extremities 4. Cranial nerves II through XII grossly intact. Skin: Warm and dry. No rashes or lesions. Extremities: No clubbing or cyanosis. No edema.     Data Reviewed: I have personally reviewed following labs and imaging studies  CBC: Recent Labs  Lab 06/30/18 1320  07/02/18 0737 07/03/18 0244 07/04/18 0254 07/05/18 0244 07/06/18 0253 07/07/18 0305  WBC 4.9   < > 4.3 5.2 5.9 5.6 5.9 6.4  NEUTROABS 1.9  --  2.2 2.5 3.2 2.8  --   --   HGB 8.8*   < > 9.2* 9.0* 9.5* 9.2* 8.7* 8.6*  HCT 28.7*   < > 30.6* 30.1* 31.9* 31.2* 29.8* 29.8*  MCV 95.0   < > 92.7 92.9 95.5 95.1 97.7 98.3  PLT 125*   < > 90* 100* 113* 107* 109* 103*   < > = values in this interval not displayed.   Basic Metabolic Panel: Recent Labs  Lab 07/03/18 0244 07/04/18 0254 07/05/18 0244 07/05/18 0849 07/06/18 0253 07/06/18 0837 07/07/18 0305  NA 141 142 141  --  142  --  146*  K 4.6 4.4 5.5* 4.5 5.4* 5.2* 5.2*  CL 103 104 101  --  105  --  106  CO2 28 29 30   --  31  --  31  GLUCOSE 172* 177* 204*  --  180*  --  164*  BUN 67* 64* 76*  --  82*  --  83*  CREATININE 2.18* 2.06* 2.15*  --  2.15*  --  2.26*  CALCIUM 8.3* 8.5* 8.4*  --  8.5*  --  8.5*  MG  --   --   --   --  2.7*  --  2.7*   GFR: Estimated Creatinine Clearance: 23.1 mL/min (Laurance Heide) (by  C-G formula based on SCr of 2.26 mg/dL (H)). Liver Function Tests: Recent Labs  Lab 06/30/18 1320 07/06/18 0837 07/07/18 0305  AST 26 23 21   ALT 22 32 29  ALKPHOS 62 61  58  BILITOT 1.4* 0.5 0.8  PROT 8.0 8.2* 8.5*  ALBUMIN 2.0* 1.7* 1.8*   No results for input(s): LIPASE, AMYLASE in the last 168 hours. Recent Labs  Lab 07/03/18 1222 07/06/18 1010  AMMONIA 21 20   Coagulation Profile: Recent Labs  Lab 06/30/18 1320 07/01/18 0303  INR 1.29 1.43   Cardiac Enzymes: No results for input(s): CKTOTAL, CKMB, CKMBINDEX, TROPONINI in the last 168 hours. BNP (last 3 results) No results for input(s): PROBNP in the last 8760 hours. HbA1C: No results for input(s): HGBA1C in the last 72 hours. CBG: Recent Labs  Lab 07/06/18 0752 07/06/18 1218 07/06/18 1611 07/06/18 2132 07/07/18 0750  GLUCAP 173* 216* 238* 169* 163*   Lipid Profile: No results for input(s): CHOL, HDL, LDLCALC, TRIG, CHOLHDL, LDLDIRECT in the last 72 hours. Thyroid Function Tests: Recent Labs    07/06/18 0837  TSH 2.574   Anemia Panel: Recent Labs    07/06/18 0837  VITAMINB12 1,191*  FOLATE 11.2   Sepsis Labs: Recent Labs  Lab 06/30/18 1315 06/30/18 1900 07/01/18 0303  PROCALCITON  --   --  0.39  LATICACIDVEN 1.73 1.16  --     Recent Results (from the past 240 hour(s))  Culture, blood (Routine x 2)     Status: None   Collection Time: 06/30/18  2:15 PM  Result Value Ref Range Status   Specimen Description BLOOD RIGHT WRIST  Final   Special Requests   Final    BOTTLES DRAWN AEROBIC AND ANAEROBIC Blood Culture results may not be optimal due to an inadequate volume of blood received in culture bottles Performed at [facility], [place] [address] [place], [place] [address]    Culture   Final    NO GROWTH 5 DAYS Performed at [facility], [place] [address], [place], [place] [address]    Report Status 07/05/2018 FINAL  Final  Culture, blood (Routine x 2)     Status: None    Collection Time: 06/30/18  2:30 PM  Result Value Ref Range Status   Specimen Description   Final    BLOOD LEFT ANTECUBITAL Performed at [facility] [address] [place], [place] [address]    Special Requests   Final    BOTTLES DRAWN AEROBIC AND ANAEROBIC Blood Culture adequate volume Performed at [facility] [address], [place], [place] [address]    Culture   Final    NO GROWTH 5 DAYS Performed at [facility], [place] [address] [place], [place] [address]    Report Status 07/05/2018 FINAL  Final  Urine culture     Status: None   Collection Time: 06/30/18  5:52 PM  Result Value Ref Range Status   Specimen Description URINE, CLEAN CATCH  Final   Special Requests   Final    Immunocompromised Performed at [facility], [place] [address], [place], [place] [address]    Culture NO GROWTH  Final   Report Status 07/02/2018 FINAL  Final  MRSA PCR Screening     Status: None   Collection Time: 07/01/18  9:15 AM  Result Value Ref Range Status   MRSA by PCR NEGATIVE NEGATIVE Final    Comment:        The GeneXpert MRSA Assay (FDA approved for NASAL specimens only), is one component of Lindsay Soulliere comprehensive MRSA colonization surveillance program. It is not intended to diagnose MRSA infection nor to guide or monitor treatment for MRSA  infections. Performed at [facility], [place] [address], [place], [place] [address]          Radiology Studies: Ct Chest Wo Contrast  Result Date: 07/06/2018 CLINICAL DATA:  Inpatient. Fever of unknown origin. History of prostate cancer and non-Hodgkin's lymphoma. EXAM: CT CHEST WITHOUT CONTRAST TECHNIQUE: Multidetector CT imaging of the chest was performed following the standard protocol without IV contrast. COMPARISON:  06/01/2018 chest CT.  06/30/2018 chest radiograph. FINDINGS: Cardiovascular: Normal heart size. Stable trace pericardial effusion/thickening.  Three-vessel coronary atherosclerosis. Atherosclerotic nonaneurysmal thoracic aorta. Normal caliber pulmonary arteries. Mediastinum/Nodes: Stable hypodense 2.1 cm inferior left thyroid lobe nodule. Unremarkable esophagus. No axillary adenopathy. Mild right paratracheal adenopathy up to 1.4 cm (series 2/image 46), mildly increased from 1.2 cm on 06/01/2018 chest CT. Stable enlarged 1.6 cm subcarinal node (series 2/image 72). Enlarged 1.6 cm lower left para-aortic node (series 2/image 93) is stable). No discrete hilar adenopathy on these noncontrast images. Lungs/Pleura: No pneumothorax. Small dependent right pleural effusion. No significant left pleural effusion. Moderate compressive atelectasis in the dependent right lower lobe. Dense masslike consolidation in the right upper lobe measures 12.0 x 10.1 cm (series 5/image 44) with surrounding patchy consolidation and ground-glass opacity, significantly increased from 6.9 x 6.7 cm on 06/01/2018 chest CT. Spiculated 1.7 cm posterior left upper lobe solid pulmonary nodule (series 5/image 59), increased from 1.1 cm. Patchy central ground-glass opacity throughout both lungs. Upper abdomen: Stable scattered granulomatous liver calcifications. Nonobstructing 3 mm upper right renal stone. Musculoskeletal: No aggressive appearing focal osseous lesions. Moderate thoracic spondylosis. IMPRESSION: 1. Dense masslike consolidation replacing much of the right upper lobe and spiculated posterior left upper lobe solid pulmonary nodule, both significantly increased since 06/01/2018 chest CT. Alenah Sarria neoplastic process should be considered, such as pulmonary lymphoma given bilateral involvement. Oona Trammel primary bronchogenic carcinoma or prostate cancer metastasis are on the differential but considered less likely. Multilobar pneumonia is considered less likely given the progression from the chest CT performed 1 month prior. 2. Nonspecific mild mediastinal lymphadenopathy, mildly increased, can not  exclude lymphoma or metastatic disease. 3. Small dependent right pleural effusion. 4. Three-vessel coronary atherosclerosis. Aortic Atherosclerosis (ICD10-I70.0). Electronically Signed   By: Ilona Sorrel M.D.   On: 07/06/2018 10:25   Dg Swallowing Func-speech Pathology  Result Date: 07/06/2018 Objective Swallowing Evaluation: Type of Study: MBS-Modified Barium Swallow Study  Patient Details Name: TAMI BARREN MRN: [id number] Date of Birth: 06-Aug-1930 Today's Date: 07/06/2018 Time: SLP Start Time (ACUTE ONLY): 0805 -SLP Stop Time (ACUTE ONLY): 0835 SLP Time Calculation (min) (ACUTE ONLY): 30 min Past Medical History: Past Medical History: Diagnosis Date . Adenomatous colon polyp  . CAD (coronary artery disease)   30% LAD Stenosis, 70% ramus intermedius stenosis, treated with PTCA and angioplasty by Dr Albertine Patricia 2004 . Cataract   right eye . CHF (congestive heart failure) ([place])  . Chronic ITP (idiopathic thrombocytopenia) (HCC)  . Cough, persistent 11/04/2015 . Diverticulosis  . DM (diabetes mellitus) ([place])  . DVT (deep venous thrombosis) ([place])   secondary to surgery . Dyspnea  . Fever 05/01/2018 . Hyperlipidemia  . Hypertension  . Inguinal hernia   right . Laceration of finger of right hand 05/01/2018  INDEX FINGER . Macrocytic anemia 03/20/2013  Suspect chemo related MDS . Microcytic anemia 07/07/2015 . Monocytosis 03/20/2013  Suspect chemo related MDS . Non Hodgkin's lymphoma ([place])  . Pleural effusion, left 11/04/2015 . Prostate CA ([place]) 09/10/2011  Gleason 3+3 R, 3+4 L lobe May 2007 Rx Radioactive seed implants Dr  [name] . PVD (peripheral vascular disease) ([place])   rt renal artery stent . Renal insufficiency  . Thrombocytopenia ([place])  . 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 Thyroid nodule   left lower lobe (annual monitoring). . Vitamin D deficiency  Past Surgical History: Past Surgical History:  Procedure Laterality Date . APPENDECTOMY    patient ? Marland Kitchen CARDIAC CATHETERIZATION   . CATARACT EXTRACTION   . CHEST TUBE INSERTION Left 02/10/2016  Procedure: INSERTION PLEURAL DRAINAGE CATHETER;  Surgeon: [name], MD;  Location: [facility];  Service: Thoracic;  Laterality: Left; . CHEST TUBE INSERTION Left 04/08/2016  Procedure: CHEST TUBE INSERTION;  Surgeon: [name], MD;  Location: [facility];  Service: Thoracic;  Laterality: Left; . COLONOSCOPY   . CORONARY ANGIOPLASTY   . CYSTOURETHROSCOPY    ROBOTIC ARM NUCLETRON SEED IMPLANTATION OF PROSTATE . EXPLORATORY LAPAROTOMY    For evaluation of lymphoma . REMOVAL OF PLEURAL DRAINAGE CATHETER Left 04/08/2016  Procedure: REMOVAL OF PLEURAL DRAINAGE CATHETER;  Surgeon: [name], MD;  Location: [facility] OR;  Service: Thoracic;  Laterality: Left; HPI: 85 year old male admitted 06/30/2018 with generalized weakness malaise and fatigue, cough and fever. PMH: DM, Non-Hodgkins Lymphoma, chronic combined heart failure, significant PVD, prostate cancer, s/p renal artery stenting, chronic ITP, CAD s/p stenting, myelodysplastic syndrome, anemia.  Patient was recently discharged on January 8 after admission for acute respiratory failure with hypoxia secondary to acute pulmonary edema in the setting of acute on chronic combined heart failure exac after he received Lawerance Matsuo transfusion for anemia. CXR 1/10 = right upper lobe airspace opacity c/w PNA.  Subjective: pt in chair, sleepy = holding head down Assessment / Plan / Recommendation CHL IP CLINICAL IMPRESSIONS 07/05/2018 Clinical Impression Patient presents with moderate oral and mild pharyngeal dysphagia with weakness resulting in decreased oral control and premature spillage of barium into pharynx.  Adequacy and timing of laryngeal elevation & closure impaired thus allowing laryngeal penetration of thin/nectar and aspiration of thin.  Did not test chin tuck posture as pt dysarthric /weak oral control.  Reflexive cough observed only  with moderate amount of aspiration and was not effective to clear aspirates.  Tsps of thin were tolerated without aspiration.   Pharyngeal swallow is overall strong without significant residuals with pudding/cracker boluses.  Pharyngeal swallow triggered at vallecular space with cracker after approx. 4 seconds, but he was protective of his airway.  Pt closed his eyes during much of this procedure and leaned his head forward - admitting he was tired when SLP asked.  Recommend dys3/ground meats/nectar liquids and allow thin via tsp.  Anticipate intake to be poor due to pt's level of fatigue.   SLP Visit Diagnosis Dysphagia, oropharyngeal phase (R13.12) Attention and concentration deficit following -- Frontal lobe and executive function deficit following -- Impact on safety and function Moderate aspiration risk;Risk for inadequate nutrition/hydration   CHL IP TREATMENT RECOMMENDATION 07/05/2018 Treatment Recommendations Therapy as outlined in treatment plan below   Prognosis 07/05/2018 Prognosis for Safe Diet Advancement Fair Barriers to Reach Goals -- Barriers/Prognosis Comment deconditioning CHL IP DIET RECOMMENDATION 07/05/2018 SLP Diet Recommendations Dysphagia 3 (Mech soft) solids;Nectar thick liquid;Thin liquid Liquid Administration via Spoon;Cup;Straw Medication Administration (No Data) Compensations Minimize environmental distractions;Slow rate;Small sips/bites Postural Changes Remain semi-upright after after feeds/meals (Comment);Seated upright at 90 degrees   CHL IP OTHER RECOMMENDATIONS 07/05/2018 Recommended Consults -- Oral Care  Recommendations Oral care before and after PO Other Recommendations --   CHL IP FOLLOW UP RECOMMENDATIONS 07/04/2018 Follow up Recommendations (No Data)   CHL IP FREQUENCY AND DURATION 07/05/2018 Speech Therapy Frequency (ACUTE ONLY) min 1 x/week Treatment Duration 1 week      CHL IP ORAL PHASE 07/05/2018 Oral Phase Impaired Oral - Pudding Teaspoon -- Oral - Pudding Cup -- Oral - Honey  Teaspoon -- Oral - Honey Cup -- Oral - Nectar Teaspoon Premature spillage;Reduced posterior propulsion;Weak lingual manipulation Oral - Nectar Cup Premature spillage;Reduced posterior propulsion;Weak lingual manipulation Oral - Nectar Straw Premature spillage;Reduced posterior propulsion;Weak lingual manipulation Oral - Thin Teaspoon Premature spillage;Reduced posterior propulsion;Weak lingual manipulation Oral - Thin Cup Premature spillage;Reduced posterior propulsion;Weak lingual manipulation Oral - Thin Straw Premature spillage;Reduced posterior propulsion;Weak lingual manipulation Oral - Puree Premature spillage;Reduced posterior propulsion;Weak lingual manipulation Oral - Mech Soft -- Oral - Regular Premature spillage;Reduced posterior propulsion;Weak lingual manipulation Oral - Multi-Consistency -- Oral - Pill -- Oral Phase - Comment --  CHL IP PHARYNGEAL PHASE 07/05/2018 Pharyngeal Phase Impaired Pharyngeal- Pudding Teaspoon -- Pharyngeal -- Pharyngeal- Pudding Cup -- Pharyngeal -- Pharyngeal- Honey Teaspoon -- Pharyngeal -- Pharyngeal- Honey Cup -- Pharyngeal -- Pharyngeal- Nectar Teaspoon Reduced laryngeal elevation;Reduced airway/laryngeal closure;Penetration/Aspiration during swallow Pharyngeal Material enters airway, remains ABOVE vocal cords and not ejected out Pharyngeal- Nectar Cup Reduced airway/laryngeal closure;Reduced laryngeal elevation;Penetration/Aspiration during swallow;Pharyngeal residue - valleculae Pharyngeal Material enters airway, remains ABOVE vocal cords and not ejected out Pharyngeal- Nectar Straw Reduced airway/laryngeal closure;Reduced laryngeal elevation;Penetration/Aspiration during swallow Pharyngeal Material enters airway, remains ABOVE vocal cords and not ejected out Pharyngeal- Thin Teaspoon Reduced airway/laryngeal closure;Reduced laryngeal elevation Pharyngeal Material does not enter airway Pharyngeal- Thin Cup Reduced laryngeal elevation;Reduced airway/laryngeal  closure;Penetration/Aspiration during swallow Pharyngeal Material enters airway, passes BELOW cords without attempt by patient to eject out (silent aspiration);Material enters airway, passes BELOW cords and not ejected out despite cough attempt by patient Pharyngeal- Thin Straw Reduced airway/laryngeal closure;Reduced laryngeal elevation;Penetration/Aspiration during swallow Pharyngeal Material enters airway, passes BELOW cords without attempt by patient to eject out (silent aspiration);Material enters airway, passes BELOW cords and not ejected out despite cough attempt by patient Pharyngeal- Puree WFL Pharyngeal -- Pharyngeal- Mechanical Soft -- Pharyngeal -- Pharyngeal- Regular Pharyngeal residue - valleculae Pharyngeal -- Pharyngeal- Multi-consistency -- Pharyngeal -- Pharyngeal- Pill -- Pharyngeal -- Pharyngeal Comment --  CHL IP CERVICAL ESOPHAGEAL PHASE 07/05/2018 Cervical Esophageal Phase WFL Pudding Teaspoon -- Pudding Cup -- Honey Teaspoon -- Honey Cup -- Nectar Teaspoon -- Nectar Cup -- Nectar Straw -- Thin Teaspoon -- Thin Cup -- Thin Straw -- Puree -- Mechanical Soft -- Regular -- Multi-consistency -- Pill -- Cervical Esophageal Comment -- [name] 07/06/2018, 7:57 AM  Luanna Salk, MS [facility] SLP Acute Rehab Services Pager [phone] Office [phone]                  Scheduled Meds: . aspirin EC  81 mg Oral Daily  . budesonide (PULMICORT) nebulizer solution  0.5 mg Nebulization BID  . fluticasone  2 spray Each Nare Daily  . gabapentin  100 mg Oral QHS  . insulin aspart  0-15 Units Subcutaneous TID WC  . insulin aspart  0-5 Units Subcutaneous QHS  . insulin glargine  10 Units Subcutaneous Daily  . ipratropium-albuterol  3 mL Nebulization BID  . loratadine  10 mg Oral Daily  . mouth rinse  15 mL Mouth Rinse BID  . metoprolol tartrate  5 mg Intravenous Q6H  . multivitamin with minerals  1 tablet Oral BH-q7a  . polyethylene glycol  17 g Oral Daily  . pravastatin  40 mg Oral  Daily  . tamsulosin  0.4 mg Oral Daily   Continuous Infusions: . sodium chloride 10 mL/hr at 07/07/18 0610  . ampicillin-sulbactam (UNASYN) IV Stopped (07/07/18 0539)  . dextrose 5 % and 0.45% NaCl 50 mL/hr at 07/07/18 0744     LOS: 7 days    Time spent: over 30 min    Fayrene Helper, MD [facility] Pager see AMION  If 7PM-7AM, please contact night-coverage [email or web address] Password [facility] 07/07/2018, 8:31 AM

## 2018-07-07 NOTE — Progress Notes (Signed)
Notifed MD on call via text/page in regards to patient's increased respiratory rate into the low 30's on 4L [place]. He has documented intermittent increased respiratory rates since admission. No use of accessory muscles and patient continues to be arousable. ST on monitor up to 130's, currently 116. He has been running mostly ST since admission.  He had scheduled lopressor IV scheduled at [facility]. He has a poor prognosis with a palliative consult pending. Awaiting return call for advisement.

## 2018-07-08 LAB — GLUCOSE, CAPILLARY: Glucose-Capillary: 258 mg/dL — ABNORMAL HIGH (ref 70–99)

## 2018-07-08 MED ORDER — ATROPINE SULFATE 1 % OP SOLN
2.0000 [drp] | OPHTHALMIC | Status: DC | PRN
  Filled 2018-07-08: qty 2

## 2018-07-08 MED ORDER — OXYCODONE HCL 20 MG/ML PO CONC
5.0000 mg | ORAL | Status: DC | PRN
  Administered 2018-07-08: 10 mg via ORAL
  Filled 2018-07-08: qty 1

## 2018-07-08 MED ORDER — LORAZEPAM 2 MG/ML PO CONC: 0.5000 mg | ORAL | 0 refills | Status: AC | PRN

## 2018-07-08 MED ORDER — ATROPINE SULFATE 1 % OP SOLN: 2.0000 [drp] | OPHTHALMIC | 12 refills | Status: AC | PRN

## 2018-07-08 MED ORDER — HALOPERIDOL LACTATE 2 MG/ML PO CONC: 0.5000 mg | ORAL | 0 refills | Status: AC | PRN

## 2018-07-08 MED ORDER — OXYCODONE HCL 20 MG/ML PO CONC: 10.0000 mg | ORAL | 0 refills | Status: AC | PRN

## 2018-07-08 MED ORDER — LORAZEPAM 2 MG/ML PO CONC
0.5000 mg | ORAL | Status: DC | PRN
  Filled 2018-07-08: qty 0.25

## 2018-07-08 MED ORDER — OXYCODONE HCL 20 MG/ML PO CONC: 10.0000 mg | ORAL | 0 refills | Status: DC | PRN

## 2018-07-08 MED ORDER — ACETAMINOPHEN 325 MG PO TABS: 650.0000 mg | ORAL_TABLET | Freq: Four times a day (QID) | ORAL | 0 refills | Status: AC | PRN

## 2018-07-08 MED ORDER — HALOPERIDOL LACTATE 2 MG/ML PO CONC
0.5000 mg | ORAL | Status: DC | PRN
  Filled 2018-07-08: qty 0.3

## 2018-07-08 NOTE — Progress Notes (Signed)
Daily Progress Note   Patient Name: Ricardo Watkins       Date: 07/08/2018 DOB: 09-Mar-1931  Age: 85 y.o. MRN#: [id number] Attending Physician: Elodia Florence., * Primary Care Physician: [name], MD Admit Date: 06/30/2018  Reason for Consultation/Follow-up: Disposition, Establishing goals of care and Non pain symptom management  Subjective: I saw and examined Ricardo Watkins this morning.  His breathing was less labored (received oxycodone not too long ago) but he does not participate in conversation.  Daughter reports that he had a few bites this morning for breakfast.  Discussed again plan moving forward, and family remains clear in goal to transition home with hospice.  Length of Stay: 8  Current Medications: Scheduled Meds:  . aspirin EC  81 mg Oral Daily  . budesonide (PULMICORT) nebulizer solution  0.5 mg Nebulization BID  . fluticasone  2 spray Each Nare Daily  . gabapentin  100 mg Oral QHS  . insulin glargine  10 Units Subcutaneous Daily  . ipratropium-albuterol  3 mL Nebulization BID  . loratadine  10 mg Oral Daily  . mouth rinse  15 mL Mouth Rinse BID  . metoprolol tartrate  5 mg Intravenous Q6H  . multivitamin with minerals  1 tablet Oral BH-q7a  . polyethylene glycol  17 g Oral Daily  . pravastatin  40 mg Oral Daily  . tamsulosin  0.4 mg Oral Daily    Continuous Infusions: . sodium chloride 10 mL/hr at 07/07/18 0610  . ampicillin-sulbactam (UNASYN) IV Stopped (07/08/18 0554)    PRN Meds: sodium chloride, acetaminophen **OR** acetaminophen, albuterol, ALPRAZolam, bisacodyl, glycopyrrolate, guaiFENesin, HYDROcodone-homatropine, loperamide, LORazepam, ondansetron **OR** ondansetron (ZOFRAN) IV, oxyCODONE, RESOURCE THICKENUP CLEAR, traMADol, traZODone  Physical  Exam   General: Somnolent Heart: Tachycardic Lungs: Tachypnic but work of breathing much improved.  Decreased air movement. Abdomen: Nondistended Ext: No significant edema     Vital Signs: BP (!) 102/41   Pulse (!) 113   Temp 100 F (37.8 C) (Oral)   Resp (!) 32   Ht 5\' 8"  (1.727 m)   Wt 76.2 kg   SpO2 93%   BMI 25.54 kg/m  SpO2: SpO2: 93 % O2 Device: O2 Device: Nasal Cannula O2 Flow Rate: O2 Flow Rate (L/min): 4 L/min  Intake/output summary:   Intake/Output Summary (Last 24 hours) at  07/08/2018 0944 Last data filed at 07/08/2018 8101 Gross per 24 hour  Intake 1359.7 ml  Output -  Net 1359.7 ml   LBM: Last BM Date: 07/05/18(per report) Baseline Weight: Weight: 76.7 kg Most recent weight: Weight: 76.2 kg       Palliative Assessment/Data:    Flowsheet Rows     Most Recent Value  Intake Tab  Referral Department  Hospitalist  Unit at Time of Referral  ICU  Palliative Care Primary Diagnosis  Cardiac  Date Notified  07/06/18  Palliative Care Type  Return patient Palliative Care  Reason for referral  Clarify Goals of Care  Date of Admission  06/30/18  # of days IP prior to Palliative referral  6  Clinical Assessment  Psychosocial & Spiritual Assessment  Palliative Care Outcomes      Patient Active Problem List   Diagnosis Date Noted  . Pressure injury of skin 07/01/2018  . Bacteremia due to Gram-positive bacteria 06/30/2018  . HAP (hospital-acquired pneumonia) 06/30/2018  . Fever   . Positive blood culture   . Anxiety state   . Malnutrition of moderate degree 06/26/2018  . Pulmonary mass   . Palliative care by specialist   . Community acquired pneumonia, bilateral   . CRF (chronic renal failure), stage 3 (moderate) ([place])   . Sepsis due to Enterococcus ([place])   . Bacteremia   . Palliative care encounter   . HCAP (healthcare-associated pneumonia) 05/28/2018  . Goals of care, counseling/discussion 05/28/2018  . FUO (fever of unknown origin) 05/01/2018  .  Unstable angina ([place])   . Chronic systolic CHF (congestive heart failure) ([place])   . Acute on chronic combined systolic and diastolic CHF (congestive heart failure) ([place]) 04/22/2018  . Nonrheumatic aortic valve stenosis   . Chest pain in adult 04/20/2018  . Chronic neutropenia ([place]) 03/02/2018  . CHF (congestive heart failure) ([place]) 02/19/2018  . Upper airway cough syndrome 10/27/2017  . Abnormal CT of the chest 07/27/2017  . Flu-like symptoms 06/02/2017  . Elevated lactic acid level 06/02/2017  . Acute kidney injury superimposed on chronic kidney disease ([place]) 06/02/2017  . CKD (chronic kidney disease), stage IV ([place]) 12/16/2016  . Bilateral carotid artery stenosis 12/11/2016  . Dyslipidemia 12/11/2016  . Anemia in chronic kidney disease 07/01/2016  . Chronic ITP (idiopathic thrombocytopenia) ([facility]) 07/01/2016  . B12 deficiency anemia   . Chest tube in place   . Acute diastolic CHF (congestive heart failure) ([place]) 04/16/2016  . Acute on chronic respiratory failure with hypoxia ([place]) 04/14/2016  . Empyema lung ([place])   . Type 2 diabetes mellitus ([place]) 04/04/2016  . Empyema ([place]) 04/03/2016  . Thyroid nodule 03/03/2016  . Sepsis ([place]) 02/17/2016  . DOE (dyspnea on exertion) 01/27/2016  . Acute pulmonary edema ([place]) 11/12/2015  . Cough, persistent 11/04/2015  . Pleural effusion 11/04/2015  . Anorexia 08/08/2015  . Symptomatic anemia 07/07/2015  . Pancytopenia ([place]) 04/08/2015  . Diarrhea 04/08/2015  . Encounter for chemotherapy management 04/02/2015  . Neutropenia ([place]) 02/18/2015  . Myelodysplasia (myelodysplastic syndrome) ([place]) 11/20/2014  . Deficiency anemia 04/19/2014  . Thrombocytopenia ([place]) 04/19/2014  . Vitamin B12 deficiency 04/19/2014  . Chronic renal failure, stage 3 (moderate) ([place]) 04/19/2014  . Macrocytic anemia 03/20/2013  . Monocytosis 03/20/2013  . Thrombotic stroke ([place]) 09/10/2011  . Prostate CA ([place]) 09/10/2011  . Inguinal hernia   . Colon polyps   . CAD  (coronary artery disease) 08/26/2010  . Murmur 08/26/2010  . MURMUR 08/26/2010  . History of  lymphoma 08/25/2010  . THROMBOCYTOPENIA 08/25/2010  . Essential hypertension 08/25/2010  . Coronary atherosclerosis 08/25/2010  . 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 His clinical course has continued to decline and palliative consulted for goals of care.  Recommendations/Plan: - Patient's wife is clear that she would like to transition home with hospice support.  We discussed care plan moving forward at length.  She feels strongly about the following:             -She does not want him to get morphine (she is ok with other opioids, including oxycodone which he is tolerating well)             - She is struggling with stopping his insulin.  Will continue lantus but stop mealtime checks and correction.  He has insulin at home and I explained that hospice will not refill this once he is out (I believe he will die before this becomes an issue).             - He cannot have a foley catheter due to bladder hernia       - Would recommend continue his Flomax (as he has retention history and cannot have foley)  On discharge, recommend the following Pain or Shortness of breath:  - Recommend start Oxycodone Intensol 20mg /ml; 10mg  (1/52ml) Q2hr sublingual prn pain or shortness of breath; Dispense 47ml on d/c.  Agitation:  -Recommend start Haldol Intensol 2mg /ml; give 0.5mg  Q2H sublingual prn agitation or nausea; Dispense 33mL on d/c.  Anxiety: -Recommend start Ativan Intensol 2mg /ml; give 0.5mg  Q2H sublingual prn anxiety; Dispense 41mL on d/c.  Nausea: -Recommend start Haldol Intensol 2mg /ml; give 0.5mg  Q2H  sublingual prn agitation or nausea; Dispense 20mL on d/c.  Secretions:  -Recommend Atropine 1% eye drops (2 drops) sublingual Q4H sublingual prn secretions; Dispense 54ml on d/c.   Goals of Care and Additional Recommendations:  Limitations on Scope of Treatment: Full Comfort Care  Code Status:    Code Status Orders  (From admission, onward)         Start     Ordered   06/30/18 2201  Do not attempt resuscitation (DNR)  Continuous    Question Answer Comment  In the event of cardiac or respiratory ARREST Do not call a "code blue"   In the event of cardiac or respiratory ARREST Do not perform Intubation, CPR, defibrillation or ACLS   In the event of cardiac or respiratory ARREST Use medication by any route, position, wound care, and other measures to relive pain and suffering. May use oxygen, suction and manual treatment of airway obstruction as needed for comfort.   Comments discussed with wife and daughter in law      06/30/18 2200        Code Status History    Date Active Date Inactive Code Status Order ID Comments User Context   06/24/2018 1956 06/28/2018 2157 DNR [id number]  Charlynne Cousins, MD Inpatient   06/24/2018 1617 06/24/2018 1956 DNR [id number]  Dorie Rank, MD ED   05/28/2018 1019 06/12/2018 2056 DNR [id number]  Lady Deutscher, MD ED   05/01/2018 1604 05/03/2018 2216 DNR [id number]  Karmen Bongo, MD ED   04/20/2018 1557  04/22/2018 1712 Full Code [id number]  Caren Griffins, MD ED   02/19/2018 1540 02/21/2018 1658 Full Code [id number]  Cristal Deer, MD Inpatient   06/02/2017 1728 06/04/2017 1652 Partial Code [id number]  Radene Gunning, NP ED   06/02/2017 1612 06/02/2017 1728 Full Code [id number]  Radene Gunning, NP ED   04/04/2016 0123 04/20/2016 1808 Full Code [id number]  Reubin Milan, MD ED   02/17/2016 2248 02/21/2016 1811 Full Code [id number]  Norval Morton, MD Inpatient    Advance Directive Documentation     Most Recent Value  Type of Advance Directive   Living will, Healthcare Power of Attorney  Pre-existing out of facility DNR order (yellow form or pink MOST form)  -  "MOST" Form in Place?  -       Prognosis:   < 2 weeks  Discharge Planning:  Home with Hospice  Care plan was discussed with CM, Dr. Florene Glen, family at bedside  Thank you for allowing the Palliative Medicine Team to assist in the care of this patient.   Total Time 30 Prolonged Time Billed No      Greater than 50%  of this time was spent counseling and coordinating care related to the above assessment and plan.  Micheline Rough, MD  Please contact Palliative Medicine Team phone at [phone] for questions and concerns.

## 2018-07-08 NOTE — Discharge Summary (Signed)
**Note De-Identified vi Obfusction** Physicin Dischrge Summry  ASHE GAGO QQP:[id number] DOB: Oct 17, 1930 DOA: 06/30/2018  PCP: [name], MD  Admit dte: 06/30/2018 Dischrge dte: 07/08/2018  Time spent: 40 minutes  Recommendtions for Outptient Follow-up:  1. Continue comfort mesures per hospice   Dischrge Dignoses:  Principl Problem:   Sepsis ([place]) Active Problems:   [place] hypertension   Myelodysplsi (myelodysplstic syndrome) (HCC)   Type 2 dibetes mellitus (HCC)   Chronic systolic CHF (congestive hert filure) (HCC)   Bcteremi   Pulmonry mss   Bcteremi due to Grm-positive bcteri   HAP (hospitl-cquired pneumoni)   Pressure injury of skin   Dischrge Condition: poor  Diet recommendtion: s tolerted for comfort  Filed Weights   07/03/18 0500 07/04/18 0500 07/08/18 0500  Weight: 77.4 kg 76 kg 76.2 kg    History of present illness:  Per Dr. Tni Ade R Appleis  85 y.o.mlewith medicl history significnt ofchronic combined hert filure,significnt peripherl vsculr disese, remote history of prostte cncer, sttus post renl rtery stenting, chronic ITP, on chronic home O2 CAD sttus post stenting, recently deemed not n intervention cndidte,ndmyelodysplstic syndrome with 5 q. deletion, who getsfrequent hemoglobin checks becuse of nemi.Ptient ws recently dischrged on Jnury 8 fter dmission for cute respirtory filure with hypoxi secondry to cute pulmonry edem in the setting of cute on chronic combined hert filure excfter he received  trnsfusion for nemi  He representsfrom hometody becuse of generlized wekness mlise nd ftigue. Fmily reported tht ptient begn hving worsening cough fever 2 dys go nd PCP prescribed doxycycline nd obtinedblood cultures which returned 1 of 2+ GPR. Fmily reports being progressively more sleepy over the lst dy so they presented to the ER. In the ER ptient ws unble to  provide much meningful history but wife/dughter-in-lw reported lethrgy, decrese energy nd inbility to mbulte more thn 2-3 steps with home helth ide tody. As  result they brought him to the ER withhospitl course s follows:  ED Course:Ptient ws febrile to 101.9 blood pressure 104/52 pulse of 109 respirtions of 21 stting 99% on nsl cnnul 4 L. Review of microbiology shows positive blood cultures t 3 dys,  white blood cell count of 4.9 BMP with  mildly elevted potssium t 5.6 nd  CO2 of 30 nd BUN nd cretinine is 76 nd 2.3 which is not fr from ptient's bseline. In the ER he received Vnco nd cefepime lthough cutious fluid dministrtion secondry to ptient's CHF nd rpid decompenstion with miniml dditionl volume. His lctte ws 1.7  He ws dmitted with concern for pneumoni nd eggerthell bcteremi.  Due to persistent fevers,  CT chest ws obtined showing  mss concerning for mlignncy.  Risks of procedures were felt to outweigh benefits.  After discussion with pllitive cre, pln ws for ptient to go home with hospice.   See below for further detils  Hospitl Course:   Gols of cre:  Plnning for dischrge home with hospice with comfort mesures.  Apprecite pllitive cre ssistnce.    Pulmonry Mss: CT scn concerning for mlignncy.   Pulmonry hs been c/s for dditionl recs, pprecite recs - risk of interventions likely outweight benefit, recommending pllitive cre (see note) Discussed with [name] s well who greed with pllitive pproch, but noted in pst wife hs been most resistnt to this in pst. After discussion with pllitive cre, pln ws to trnsition home with hospice support  1 Fever  Sepsis likely secondry to helthcre ssocited pneumonind grm-positivebcteremiwith eggethellspecies CT chest concerning for mlignncy Recurrent fevers  likely due to malignancy rather than infection 1/2  blood cx from 1/6 with Eggerthella catenaformis Repeat blood cx from 1/10 NGTD Urine cx no growth, ua bland Continue IV meropenem per ID Continue Pulmicort, Flonase, Mucinex, scheduled nebs.  IV fluids have been saline locked.  Patient also noted from CT scan of 06/01/2018 to have concerns on scan for lung mass/malignancy.  Fevers likely 2/2 malignancy.  ID following, appreciate recs.  Narrowed to unasyn (continue through 1/24), this has now been continued with plan for discharge home with hospice with comfort measures.  cute Hypoxic Respiratory Failure  Tachycardia:  2/2 pulmonary mass, fevers bx have been d/c'd with comfort measures as noted above ppreciate pulm recs as noted below IV metop ordered  cute Encephalopathy: likely 2/2 above, he's &Ox3, but overall sleepy. follow TSH, VBG, B12, ammonia (wnl).  Dysphagia: allow feeds as tolerated for comfort  2. Hypertension Continue metop.  D/c lasix.  Continue flomax.  3. HFpEF EF 96-78%, grade 2 diastolic dysfunction 93/8101 Received IVF on admission Last admission, concern for flash pulm edema after transfusion Holding lasix  4. Myelodysplastic syndrome lesion leading to symptomatic microcyticanemia No additional labs with comfort measures   5. cute respiratory failure with hypoxia Secondary to problem #1. See problem #1.  6. Hyperkalemia No additional labs with focus on comfort  7. Diabetes mellitus type 2 Continuing lantus at discharge per palliative discussion with wife/family  8. Hyperlipidemia D/c statin  9. Chronic kidney disease stage IV  # Hypernatremia:   10. Chronic thrombocytopenia due to ITP  11. History of coronary artery disease/peripheral artery disease Patient's Plavix was discontinued during last hospitalization as patient has not had stenting over 10 years and due to patient's chronic thrombocytopenia and anemia requiring frequent transfusions. D/c asa and statin  with comfort measures.  Lasix being held.  Continue metop.   12. Constipation Treat as needed  13. Stage I pressure injury to buttocks Continue current wound care.  14. Goals of care Planning for palliative care c/s as noted above  Procedures: none  Consultations:  ID  Pulm  Pallitive care  Discharge Exam: Vitals:   07/08/18 0800 07/08/18 1001  BP: (!) 110/58 (!) 128/42  Pulse: (!) 120 (!) 126  Resp: (!) 34 (!) 31  Temp: 100 F (37.8 C)   SpO2: 93% 96%   Sleepy, responds with few words Family at bedside  General: No acute distress. Cardiovascular: tachypneic Lungs: tachypneic bdomen: Soft, nontender, nondistended  Neurological: sleepy, but will respond to voice appropriately, not as talkative today Extremities: No clubbing or cyanosis. No edema  Discharge Instructions   Discharge Instructions    Diet - low sodium heart healthy   Complete by:  s directed    Discharge instructions   Complete by:  s directed    We are discharging you home with hospice for comfort measures.  We're prescribing medications for comfort including: Oxycodone for pain or shortness of breath tropine for secretions Haldol for agitation or nausea tivan for anxiety  Please let us know if you have any questions or concerns.   Increase activity slowly   Complete by:  s directed      llergies as of 07/08/2018      Reactions   Glucophage [metformin Hydrochloride] Other (See Comments)   Chest pain   Zetia [ezetimibe] Other (See Comments)   weakness   Fenofibrate Rash   Niacin-lovastatin Er Rash      Medication List    STOP taking these medications **Note De-Identified vi Obfusction** ALPRAZolm 0.25 MG tblet Commonly known s:  XANAX   spirin 81 MG EC tblet   DELSYM NIGHT CGH/COLD CHILDREN 6.25-2.5 MG/5ML Liqd Generic drug:  diphenhydrAMINE-Phenylephrine   doxycycline 100 MG tblet Commonly known s:  VIBRA-TABS   feeding supplement (ENSURE ENLIVE) Liqd   furosemide 20 MG  tblet Commonly known s:  LASIX   gbpentin 100 MG cpsule Commonly known s:  NEURONTIN   meclizine 12.5 MG tblet Commonly known s:  ANTIVERT   multivitmin with minerls Tbs tblet   nitroGLYCERIN 0.4 MG SL tblet Commonly known s:  NITROSTAT   NON FORMULARY   ondnsetron 4 MG tblet Commonly known s:  ZOFRAN   OVER THE COUNTER MEDICATION   prvsttin 40 MG tblet Commonly known s:  PRAVACHOL   trMADol 50 MG tblet Commonly known s:  ULTRAM     TAKE these medictions   cetminophen 325 MG tblet Commonly known s:  TYLENOL Tke 2 tblets (650 mg totl) by mouth every 6 (six) hours s needed for up to 30 dys for mild pin or fever. Wht chnged:    mediction strength  how much to tke  resons to tke this   tropine 1 % ophthlmic solution Plce 2 drops under the tongue every 4 (four) hours s needed (secretions).   hloperidol 2 MG/ML solution Commonly known s:  HALDOL Tke 0.3 mLs (0.6 mg totl) by mouth every 2 (two) hours s needed for gittion (or nuse).   Insulin Glrgine 100 UNIT/ML Solostr Pen Commonly known s:  LANTUS SOLOSTAR Inject 15 Units into the skin t bedtime. INJECT 20 UNITS EVERY NIGHT AT BEDTIME Wht chnged:    when to tke this  dditionl instructions   lopermide 2 MG cpsule Commonly known s:  IMODIUM Tke 2 mg by mouth s needed for dirrhe or loose stools.   LORzepm 2 MG/ML concentrted solution Commonly known s:  ATIVAN Tke 0.3 mLs (0.6 mg totl) by mouth every 2 (two) hours s needed for nxiety.   metoprolol succinte 50 MG 24 hr tblet Commonly known s:  TOPROL-XL Tke 1 tblet (50 mg totl) by mouth dily. Tke with or immeditely following  mel.   oxyCODONE 20 MG/ML concentrted solution Commonly known s:  ROXICODONE INTENSOL Tke 0.5 mLs (10 mg totl) by mouth every 2 (two) hours s needed (pin or shortness of breth).   polyethylene glycol pcket Commonly known s:  MIRALAX /  GLYCOLAX Tke 17 g by mouth dily.   tmsulosin 0.4 MG Cps cpsule Commonly known s:  FLOMAX TAKE 1 CAPSULE BY MOUTH ONCE DAILY            [facility] Equipment  (From dmission, onwrd)         Strt     Ordered   07/02/18 1446  For home use only DME 3 n 1  Once     07/02/18 1445         Allergies  Allergen Rections  . Glucophge [Metformin Hydrochloride] Other (See Comments)    Chest pin  . Zeti [Ezetimibe] Other (See Comments)    wekness  . Fenofibrte Rsh  . Nicin-Lovsttin Er Rsh      The results of significnt dignostics from this hospitliztion (including imging, microbiology, ncillry nd lbortory) re listed below for reference.    Significnt Dignostic Studies: Dg Chest 2 View  Result Dte: 06/30/2018 CLINICAL DATA:  Shortness of breth. EXAM: CHEST - 2 VIEW COMPARISON:  Rdiogrph Jnury 6, 2020. FINDINGS: Stble crdiomedistinl silhouette.  therosclerosis of thoracic aorta is noted. No pneumothorax or significant pleural effusion is noted. Left lung is unremarkable. Stable right upper lobe airspace opacity is noted most consistent with pneumonia. Bony thorax is unremarkable IMPRESSION: Stable right upper lobe airspace opacity is noted most consistent with pneumonia. Followup P and lateral chest X-ray is recommended in 3-4 weeks following trial of antibiotic therapy to ensure resolution and exclude underlying malignancy. Electronically Signed   By: Marijo Conception, M.D.   On: 06/30/2018 13:37   Ct Chest Wo Contrast  Result Date: 07/06/2018 CLINICL DT:  Inpatient. Fever of unknown origin. History of prostate cancer and non-Hodgkin's lymphoma. EXM: CT CHEST WITHOUT CONTRST TECHNIQUE: Multidetector CT imaging of the chest was performed following the standard protocol without IV contrast. COMPRISON:  06/01/2018 chest CT.  06/30/2018 chest radiograph. FINDINGS: Cardiovascular: Normal heart size. Stable trace pericardial  effusion/thickening. Three-vessel coronary atherosclerosis. therosclerotic nonaneurysmal thoracic aorta. Normal caliber pulmonary arteries. Mediastinum/Nodes: Stable hypodense 2.1 cm inferior left thyroid lobe nodule. Unremarkable esophagus. No axillary adenopathy. Mild right paratracheal adenopathy up to 1.4 cm (series 2/image 46), mildly increased from 1.2 cm on 06/01/2018 chest CT. Stable enlarged 1.6 cm subcarinal node (series 2/image 72). Enlarged 1.6 cm lower left para-aortic node (series 2/image 93) is stable). No discrete hilar adenopathy on these noncontrast images. Lungs/Pleura: No pneumothorax. Small dependent right pleural effusion. No significant left pleural effusion. Moderate compressive atelectasis in the dependent right lower lobe. Dense masslike consolidation in the right upper lobe measures 12.0 x 10.1 cm (series 5/image 44) with surrounding patchy consolidation and ground-glass opacity, significantly increased from 6.9 x 6.7 cm on 06/01/2018 chest CT. Spiculated 1.7 cm posterior left upper lobe solid pulmonary nodule (series 5/image 59), increased from 1.1 cm. Patchy central ground-glass opacity throughout both lungs. Upper abdomen: Stable scattered granulomatous liver calcifications. Nonobstructing 3 mm upper right renal stone. Musculoskeletal: No aggressive appearing focal osseous lesions. Moderate thoracic spondylosis. IMPRESSION: 1. Dense masslike consolidation replacing much of the right upper lobe and spiculated posterior left upper lobe solid pulmonary nodule, both significantly increased since 06/01/2018 chest CT.  neoplastic process should be considered, such as pulmonary lymphoma given bilateral involvement.  primary bronchogenic carcinoma or prostate cancer metastasis are on the differential but considered less likely. Multilobar pneumonia is considered less likely given the progression from the chest CT performed 1 month prior. 2. Nonspecific mild mediastinal lymphadenopathy,  mildly increased, can not exclude lymphoma or metastatic disease. 3. Small dependent right pleural effusion. 4. Three-vessel coronary atherosclerosis. ortic therosclerosis (ICD10-I70.0). Electronically Signed   By: Ilona Sorrel M.D.   On: 07/06/2018 10:25   Dg Chest Port 1 View  Result Date: 06/26/2018 CLINICL DT:  Fever EXM: PORTBLE CHEST 1 VIEW COMPRISON:  06/24/2018 FINDINGS: Unchanged right upper lobe consolidation. No sizable pleural effusion or pneumothorax. Heart size is normal. Pulmonary edema has resolved. IMPRESSION: Unchanged appearance of right upper lobe pneumonia. Resolution of pulmonary edema. Electronically Signed   By: Ulyses Jarred M.D.   On: 06/26/2018 17:18   Dg Chest Port 1 View  Result Date: 06/24/2018 CLINICL DT:  85 y/o M; chest tightness, shortness of breath, fatigue, anemia. EXM: PORTBLE CHEST 1 VIEW COMPRISON:  06/05/2018 chest radiograph FINDINGS: Large right upper lung zone consolidation. Interstitial and alveolar pulmonary edema. Small left pleural effusion. Stable cardiac silhouette. ortic calcific atherosclerosis. No acute osseous abnormality is evident. IMPRESSION: Stable large right upper lung zone consolidation. Interstitial and alveolar pulmonary edema. Small left pleural effusion. Electronically Signed   By: Mia [place] **Note De-Identified vi Obfusction** [place] M.D.   On: 06/24/2018 15:56   Dg Swllowing Func-speech Pthology  Result Dte: 07/06/2018 Objective Swllowing Evlution: Type of Study: MBS-Modified Brium Swllow Study  Ptient Detils Nme: JOZEPH PERSING MRN: [id number] Dte of Birth: [birth year] Tody's Dte: 07/06/2018 Time: SLP Strt Time (ACUTE ONLY): 0805 -SLP Stop Time (ACUTE ONLY): 0835 SLP Time Clcultion (min) (ACUTE ONLY): 30 min Pst Medicl History: Pst Medicl History: Dignosis Dte . Adenomtous colon polyp  . CAD (coronry rtery disese)   30% LAD Stenosis, 70% rmus intermedius stenosis, treted with PTCA nd ngioplsty by Dr Albertine Ptrici 2004 . Ctrct    right eye . CHF (congestive hert filure) ([place])  . Chronic ITP (idiopthic thrombocytopeni) (HCC)  . Cough, persistent 11/04/2015 . Diverticulosis  . DM (dibetes mellitus) ([place])  . DVT (deep venous thrombosis) ([place])   secondry to surgery . Dyspne  . Fever 05/01/2018 . Hyperlipidemi  . Hypertension  . Inguinl herni   right . Lcertion of finger of right hnd 05/01/2018  INDEX FINGER . Mcrocytic nemi 03/20/2013  Suspect chemo relted MDS . Microcytic nemi 07/07/2015 . Monocytosis 03/20/2013  Suspect chemo relted MDS . Non Hodgkin's lymphom ([place])  . Pleurl effusion, left 11/04/2015 . Prostte CA ([place]) 09/10/2011  [place] 3+3 R, 3+4 L lobe My 2007 Rx Rdioctive seed implnts Dr Cristel Felt . PVD (peripherl vsculr disese) ([place])   rt renl rtery stent . Renl insufficiency  . Thrombocytopeni ([place])  . Thrombotic stroke ([place]) 09/10/2011  January 18, 2011 infrct genu & post limb R internl cpsule - cute; previous lcunr infrcts/extensive white mtter dis . Thyroid nodule   left lower lobe (nnul monitoring). . Vitmin D deficiency  Pst Surgicl History: Pst Surgicl History: Procedure Lterlity Dte . APPENDECTOMY    ptient ? Mrlnd Kitchen CARDIAC CATHETERIZATION   . CATARACT EXTRACTION   . CHEST TUBE INSERTION Left 02/10/2016  Procedure: INSERTION PLEURAL DRAINAGE CATHETER;  Surgeon: [name], MD;  Loction: [place];  Service: Thorcic;  Lterlity: Left; . CHEST TUBE INSERTION Left 04/08/2016  Procedure: CHEST TUBE INSERTION;  Surgeon: [name], MD;  Loction: [place];  Service: Thorcic;  Lterlity: Left; . COLONOSCOPY   . CORONARY ANGIOPLASTY   . CYSTOURETHROSCOPY    ROBOTIC ARM NUCLETRON SEED IMPLANTATION OF PROSTATE . EXPLORATORY LAPAROTOMY    For evlution of lymphom . REMOVAL OF PLEURAL DRAINAGE CATHETER Left 04/08/2016  Procedure: REMOVAL OF PLEURAL DRAINAGE CATHETER;  Surgeon: [name], MD;  Loction: [facility];  Service: Thorcic;  Lterlity: Left; HPI: 77 yer old mle dmitted  06/30/2018 with generlized wekness mlise nd ftigue, cough nd fever. PMH: DM, Non-Hodgkins Lymphom, chronic combined hert filure, significnt PVD, prostte cncer, s/p renl rtery stenting, chronic ITP, CAD s/p stenting, myelodysplstic syndrome, nemi.  Ptient ws recently dischrged on Jnury 8 fter dmission for cute respirtory filure with hypoxi secondry to cute pulmonry edem in the setting of cute on chronic combined hert filure exc fter he received  trnsfusion for nemi. CXR 1/10 = right upper lobe irspce opcity c/w PNA.  Subjective: pt in chir, sleepy = holding hed down Assessment / Pln / Recommendtion CHL IP CLINICAL IMPRESSIONS 07/05/2018 Clinicl Impression Ptient presents with moderte orl nd mild phryngel dysphgi with wekness resulting in decresed orl control nd premture spillge of brium into phrynx.  Adequcy nd timing of lryngel elevtion & closure impired thus llowing lryngel penetrtion of thin/nectr nd spirtion of thin.  Did not test chin tuck posture s pt dysrthric /wek orl control.  Reflexive cough observed only with moderate amount of aspiration and was not effective to clear aspirates.  Tsps of thin were tolerated without aspiration.   Pharyngeal swallow is overall strong without significant residuals with pudding/cracker boluses.  Pharyngeal swallow triggered at vallecular space with cracker after approx. 4 seconds, but he was protective of his airway.  Pt closed his eyes during much of this procedure and leaned his head forward - admitting he was tired when SLP asked.  Recommend dys3/ground meats/nectar liquids and allow thin via tsp.  Anticipate intake to be poor due to pt's level of fatigue.   SLP Visit Diagnosis Dysphagia, oropharyngeal phase (R13.12) Attention and concentration deficit following -- Frontal lobe and executive function deficit following -- Impact on safety and function Moderate aspiration risk;Risk for  inadequate nutrition/hydration   CHL IP TREATMENT RECOMMENDATION 07/05/2018 Treatment Recommendations Therapy as outlined in treatment plan below   Prognosis 07/05/2018 Prognosis for Safe Diet Advancement Fair Barriers to Reach Goals -- Barriers/Prognosis Comment deconditioning CHL IP DIET RECOMMENDATION 07/05/2018 SLP Diet Recommendations Dysphagia 3 (Mech soft) solids;Nectar thick liquid;Thin liquid Liquid Administration via Spoon;Cup;Straw Medication Administration (No Data) Compensations Minimize environmental distractions;Slow rate;Small sips/bites Postural Changes Remain semi-upright after after feeds/meals (Comment);Seated upright at 90 degrees   CHL IP OTHER RECOMMENDATIONS 07/05/2018 Recommended Consults -- Oral Care Recommendations Oral care before and after PO Other Recommendations --   CHL IP FOLLOW UP RECOMMENDATIONS 07/04/2018 Follow up Recommendations (No Data)   CHL IP FREQUENCY AND DURATION 07/05/2018 Speech Therapy Frequency (ACUTE ONLY) min 1 x/week Treatment Duration 1 week      CHL IP ORAL PHASE 07/05/2018 Oral Phase Impaired Oral - Pudding Teaspoon -- Oral - Pudding Cup -- Oral - Honey Teaspoon -- Oral - Honey Cup -- Oral - Nectar Teaspoon Premature spillage;Reduced posterior propulsion;Weak lingual manipulation Oral - Nectar Cup Premature spillage;Reduced posterior propulsion;Weak lingual manipulation Oral - Nectar Straw Premature spillage;Reduced posterior propulsion;Weak lingual manipulation Oral - Thin Teaspoon Premature spillage;Reduced posterior propulsion;Weak lingual manipulation Oral - Thin Cup Premature spillage;Reduced posterior propulsion;Weak lingual manipulation Oral - Thin Straw Premature spillage;Reduced posterior propulsion;Weak lingual manipulation Oral - Puree Premature spillage;Reduced posterior propulsion;Weak lingual manipulation Oral - Mech Soft -- Oral - Regular Premature spillage;Reduced posterior propulsion;Weak lingual manipulation Oral - Multi-Consistency -- Oral - Pill --  Oral Phase - Comment --  CHL IP PHARYNGEAL PHASE 07/05/2018 Pharyngeal Phase Impaired Pharyngeal- Pudding Teaspoon -- Pharyngeal -- Pharyngeal- Pudding Cup -- Pharyngeal -- Pharyngeal- Honey Teaspoon -- Pharyngeal -- Pharyngeal- Honey Cup -- Pharyngeal -- Pharyngeal- Nectar Teaspoon Reduced laryngeal elevation;Reduced airway/laryngeal closure;Penetration/Aspiration during swallow Pharyngeal Material enters airway, remains ABOVE vocal cords and not ejected out Pharyngeal- Nectar Cup Reduced airway/laryngeal closure;Reduced laryngeal elevation;Penetration/Aspiration during swallow;Pharyngeal residue - valleculae Pharyngeal Material enters airway, remains ABOVE vocal cords and not ejected out Pharyngeal- Nectar Straw Reduced airway/laryngeal closure;Reduced laryngeal elevation;Penetration/Aspiration during swallow Pharyngeal Material enters airway, remains ABOVE vocal cords and not ejected out Pharyngeal- Thin Teaspoon Reduced airway/laryngeal closure;Reduced laryngeal elevation Pharyngeal Material does not enter airway Pharyngeal- Thin Cup Reduced laryngeal elevation;Reduced airway/laryngeal closure;Penetration/Aspiration during swallow Pharyngeal Material enters airway, passes BELOW cords without attempt by patient to eject out (silent aspiration);Material enters airway, passes BELOW cords and not ejected out despite cough attempt by patient Pharyngeal- Thin Straw Reduced airway/laryngeal closure;Reduced laryngeal elevation;Penetration/Aspiration during swallow Pharyngeal Material enters airway, passes BELOW cords without attempt by patient to eject out (silent aspiration);Material enters airway, passes BELOW cords and not ejected out despite cough attempt by patient Pharyngeal- Puree WFL Pharyngeal -- Pharyngeal- **Note De-Identified vi Obfusction** Mechnicl Soft -- Phryngel -- Phryngel- Regulr Phryngel residue - vllecule Phryngel -- Phryngel- Multi-consistency -- Phryngel -- Phryngel- Pill -- Phryngel -- Phryngel Comment --  CHL  IP CERVICAL ESOPHAGEAL PHASE 07/05/2018 Cervicl Esophgel Phse WFL Pudding Tespoon -- Pudding Cup -- Honey Tespoon -- Honey Cup -- Nectr Tespoon -- Nectr Cup -- Nectr Strw -- Thin Tespoon -- Thin Cup -- Thin Strw -- Puree -- Mechnicl Soft -- Regulr -- Multi-consistency -- Pill -- Cervicl Esophgel Comment -- Mcrio Golds 07/06/2018, 7:57 AM  Lunn Slk, MS [facility] SLP Acute Rehb Services [facility]ger [phone] Office [phone]              Microbiology: Recent Results (from the pst 240 hour(s))  Culture, blood (Routine x 2)     Sttus: None   Collection Time: 06/30/18  2:15 PM  Result Vlue Ref Rnge Sttus   Specimen Description BLOOD RIGHT WRIST  Finl   Specil Requests   Finl    BOTTLES DRAWN AEROBIC AND ANAEROBIC Blood Culture results my not be optiml due to n indequte volume of blood received in culture bottles Performed t [facility] [address], [place], [place] [address]    Culture   Finl    NO GROWTH 5 DAYS Performed t [facility] [place] [address], [place], [place] [address]    Report Sttus 07/05/2018 FINAL  Finl  Culture, blood (Routine x 2)     Sttus: None   Collection Time: 06/30/18  2:30 PM  Result Vlue Ref Rnge Sttus   Specimen Description   Finl    BLOOD LEFT ANTECUBITAL Performed t [facility] [address] [place], [place] [address]    Specil Requests   Finl    BOTTLES DRAWN AEROBIC AND ANAEROBIC Blood Culture dequte volume Performed t [place] [facility] [address] [place], [place] [address]    Culture   Finl    NO GROWTH 5 DAYS Performed t [facility], [place], [place], [place] [address]    Report Sttus 07/05/2018 FINAL  Finl  Urine culture     Sttus: None   Collection Time: 06/30/18  5:52 PM  Result Vlue Ref Rnge Sttus   Specimen Description URINE, CLEAN CATCH  Finl   Specil Requests   Finl     Immunocompromised Performed t [facility], [place] [address], [place], [place] [address]    Culture NO GROWTH  Finl   Report Sttus 07/02/2018 FINAL  Finl  MRSA PCR Screening     Sttus: None   Collection Time: 07/01/18  9:15 AM  Result Vlue Ref Rnge Sttus   MRSA by PCR NEGATIVE NEGATIVE Finl    Comment:        The GeneXpert MRSA [place] (FDA pproved for NASAL specimens only), is one component of  comprehensive MRSA coloniztion surveillnce progrm. It is not intended to dignose MRSA infection nor to guide or monitor tretment for MRSA infections. Performed t [facility], [place] [address] [place], [place] [address]      Lbs: Bsic Metbolic Pnel: Recent Lbs  Lb 07/03/18 0244 07/04/18 0254 07/05/18 0244 07/05/18 0849 07/06/18 0253 07/06/18 0837 07/07/18 0305  NA 141 142 141  --  142  --  146*  K 4.6 4.4 5.5* 4.5 5.4* 5.2* 5.2*  CL 103 104 101  --  105  --  106  CO2 28 29 30   --  31  --  31  GLUCOSE 172* 177* 204*  --  180*  --  164*  BUN 67* 64* 76*  --  82*  --  83*  CREATININE 2.18* 2.06* 2.15*  --  2.15*  --  2.26*  CALCIUM 8.3* 8.5* 8.4*  --  8.5*  --  8.5*  MG  --   --   --   --  2.7*  --  2.7*   Liver Function Tests: Recent Labs  Lab 07/06/18 0837 07/07/18 0305  AST 23 21  ALT 32 29  ALKPHOS 61 58  BILITOT 0.5 0.8  PROT 8.2* 8.5*  ALBUMIN 1.7* 1.8*   No results for input(s): LIPASE, AMYLASE in the last 168 hours. Recent Labs  Lab 07/03/18 1222 07/06/18 1010  AMMONIA 21 20   CBC: Recent Labs  Lab 07/02/18 0737 07/03/18 0244 07/04/18 0254 07/05/18 0244 07/06/18 0253 07/07/18 0305  WBC 4.3 5.2 5.9 5.6 5.9 6.4  NEUTROABS 2.2 2.5 3.2 2.8  --   --   HGB 9.2* 9.0* 9.5* 9.2* 8.7* 8.6*  HCT 30.6* 30.1* 31.9* 31.2* 29.8* 29.8*  MCV 92.7 92.9 95.5 95.1 97.7 98.3  PLT 90* 100* 113* 107* 109* 103*   Cardiac Enzymes: No results for input(s): CKTOTAL, CKMB, CKMBINDEX, TROPONINI in the last 168  hours. BNP: BNP (last 3 results) Recent Labs    02/19/18 0635 04/20/18 0852 05/01/18 0610  BNP 820.7* 575.0* 525.5*    ProBNP (last 3 results) No results for input(s): PROBNP in the last 8760 hours.  CBG: Recent Labs  Lab 07/06/18 2132 07/07/18 0750 07/07/18 1143 07/07/18 1620 07/08/18 0936  GLUCAP 169* 163* 241* 249* 258*       Signed:  Fayrene Helper MD.  [facility] 07/08/2018, 12:12 PM

## 2018-07-08 NOTE — Care Management Note (Signed)
Case Management Note  Patient Details  Name: Ricardo Watkins MRN: [id number] Date of Birth: Jan 13, 1931  Subjective/Objective:                    Action/Plan:Spoke with pt's wife concerning discharge plans to home with [place]. Explained that [name], [name] with [facility] would come see them in pt's room. Plan is to discharge home with Hospice and family.  Family asked for hospital bed which [facility] will order. Pt will travel home via [facility]/forms given to nurse. MD to sign DNR form.   Expected Discharge Date:  (unknown)               Expected Discharge Plan:  Home w Hospice Care  In-House Referral:     Discharge planning Services  CM Consult  Post Acute Care Choice:  Hospice Choice offered to:  Spouse, Adult Children  DME Arranged:    DME Agency:     HH Arranged:  RN [name] Agency:  [facility]  Status of Service:  Completed, signed off  If discussed at [place] of Stay Meetings, dates discussed:    Additional CommentsPurcell Mouton, RN 07/08/2018, 10:39 AM

## 2018-07-08 NOTE — Progress Notes (Addendum)
Hospice and [facility]) Hospital Liaison Visit: Received request from Dr. Domingo Cocking, confirmed by [facility] Cookie for patient/family request for [facility] services at home after discharge. Chart and patient information under review by [facility] physician. Hospice eligibility has been confirmed. Met with family at bedside to initiate education related to hospice philosophy, services and team approach to care. Patient/family verbalized understanding of information given. Per discussion, plan is for discharge to home today after DME is delivered.   DME needs discussed. Patient currently has oxygen, BSC, RW and WC at home. Hospital bed ordered for delivery to home. Oxygen setup will be switched out. Home address has been verified and is correct in the chart. Pricilla Handler is contact for DME delivery [phone].  Please send signed and completed out of facility DNR form home with patient/family.   Please send home with patient prescriptions for discharge comfort medications.  [facility] aware of the above. Completed discharge summary will need to be faxed to [facility] at [phone] when final.  Please notify [facility] when patient is ready to leave the unit at discharge. (Call [phone] between 8:30 and 5pm. Call [phone] after 5pm.)  [facility] information and contact numbers given to family during this visit.  Above information shared with RNCM Cookie.   Please call with hospice related questions. Thank you for this referral.  Erling Conte, LCSW [phone]  [place] are listed on AMION under Hospice and [place]

## 2018-07-09 DIAGNOSIS — R4020 Unspecified coma: Secondary | ICD-10-CM | POA: Diagnosis not present

## 2018-07-09 DIAGNOSIS — R404 Transient alteration of awareness: Secondary | ICD-10-CM | POA: Diagnosis not present

## 2018-07-09 DIAGNOSIS — I499 Cardiac arrhythmia, unspecified: Secondary | ICD-10-CM | POA: Diagnosis not present

## 2018-07-10 ENCOUNTER — Other Ambulatory Visit: Payer: Medicare Other

## 2018-07-13 ENCOUNTER — Encounter: Payer: Self-pay | Admitting: Hematology and Oncology

## 2018-07-13 ENCOUNTER — Other Ambulatory Visit: Payer: Medicare Other

## 2018-07-13 ENCOUNTER — Ambulatory Visit: Payer: Medicare Other | Admitting: Podiatry

## 2018-07-13 ENCOUNTER — Ambulatory Visit: Payer: Medicare Other

## 2018-07-14 ENCOUNTER — Ambulatory Visit: Payer: Medicare Other | Admitting: Podiatry

## 2018-07-20 ENCOUNTER — Ambulatory Visit: Payer: Medicare Other

## 2018-07-20 ENCOUNTER — Other Ambulatory Visit: Payer: Medicare Other

## 2018-07-22 DIAGNOSIS — 419620001 Death: Secondary | SNOMED CT | POA: Diagnosis not present

## 2018-07-22 DEATH — deceased

## 2019-02-26 IMAGING — DX DG CHEST 1V
1 series · 1 of 1 positions shown · non-contrast
Comparison: 05/31/2018; chest CT-06/01/2018

CLINICAL DATA: Post right-sided thoracentesis

EXAM:
CHEST  1 VIEW

[x chest ap]
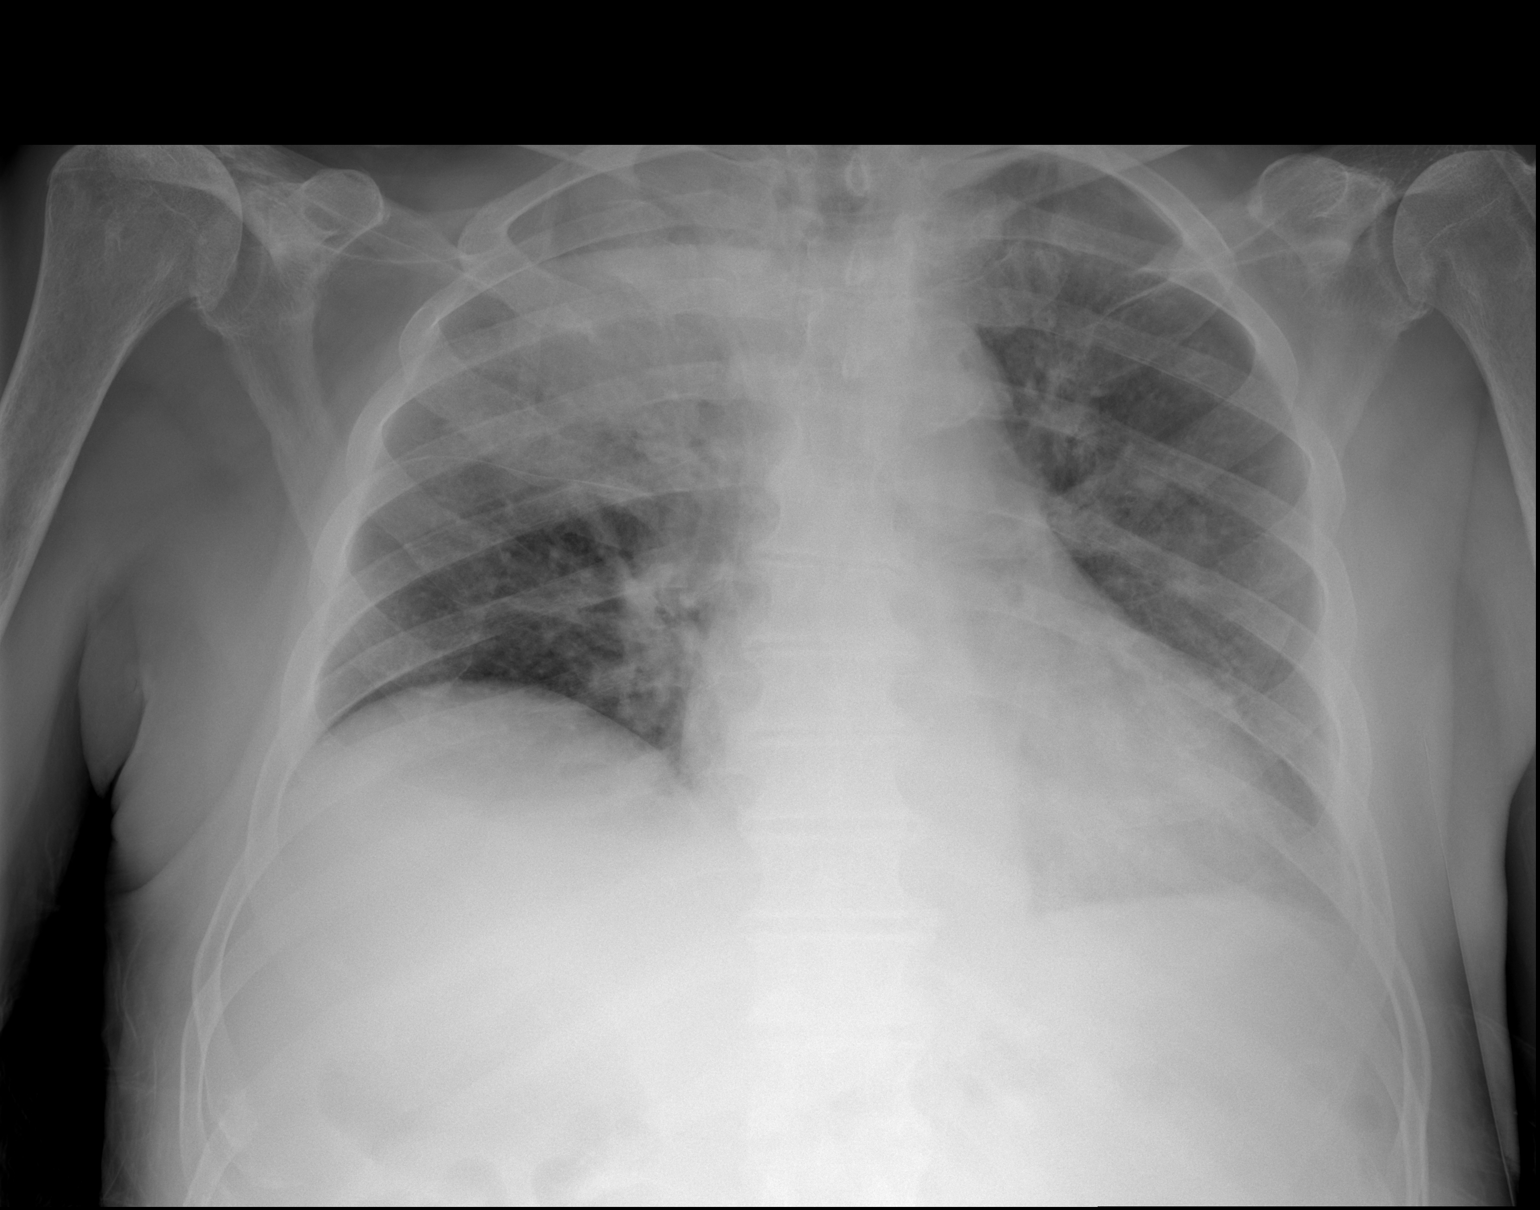

[1 of 1 positions shown; findings below may reference images not displayed]

FINDINGS: Grossly unchanged cardiac silhouette and mediastinal contours with
unchanged appearance of mixed heterogeneous and consolidative right
upper lobe mass with partial obscuration of the right paratracheal
stripe. Unchanged appearance of known left upper lobe spiculated
nodule. Pulmonary venous congestion without frank evidence of edema.
Interval reduction/resolution of right-sided pleural effusion post
thoracentesis. No pneumothorax. Unchanged trace left-sided pleural
effusion. No acute osseous abnormalities.
IMPRESSION: 1. Interval reduction/resolution of right-sided pleural effusion
post thoracentesis. No pneumothorax.
2. Otherwise similar appearance of the chest including dominant
consolidative/heterogeneous right upper lobe mass again worrisome
for infection versus malignancy.
3. Unchanged slightly spiculated left upper lobe pulmonary nodule.
4. Pulmonary venous congestion without frank evidence of edema.

## 2019-02-28 IMAGING — DX DG CHEST 2V
3 series · 3 of 3 positions shown · non-contrast
Comparison: 06/03/2018

CLINICAL DATA: Fever and shortness of breath today.

EXAM:
CHEST - 2 VIEW

[x chest ap]
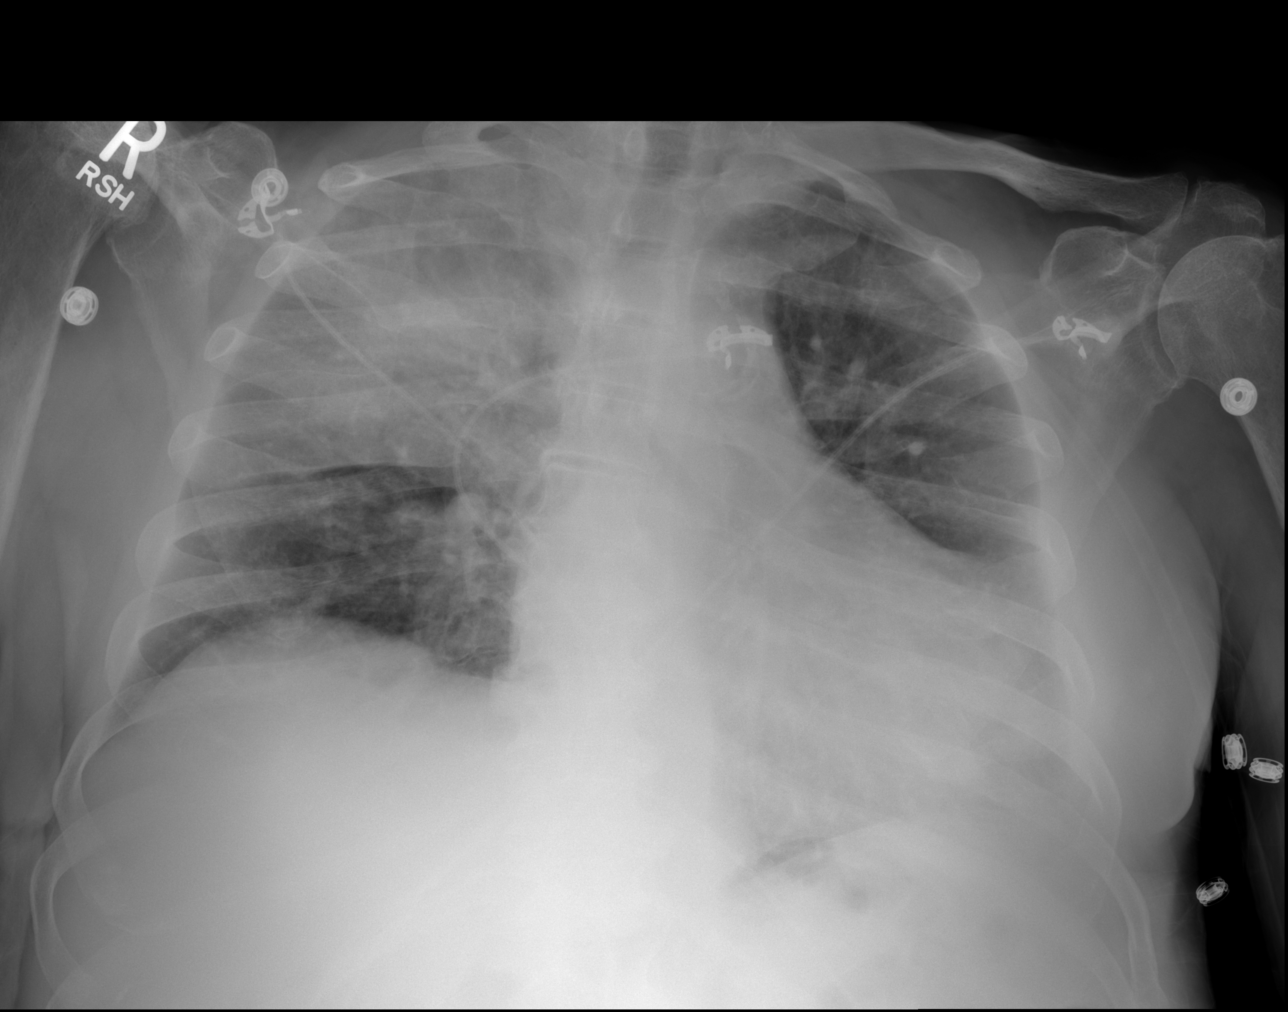

[w chest lat (1 of 2)]
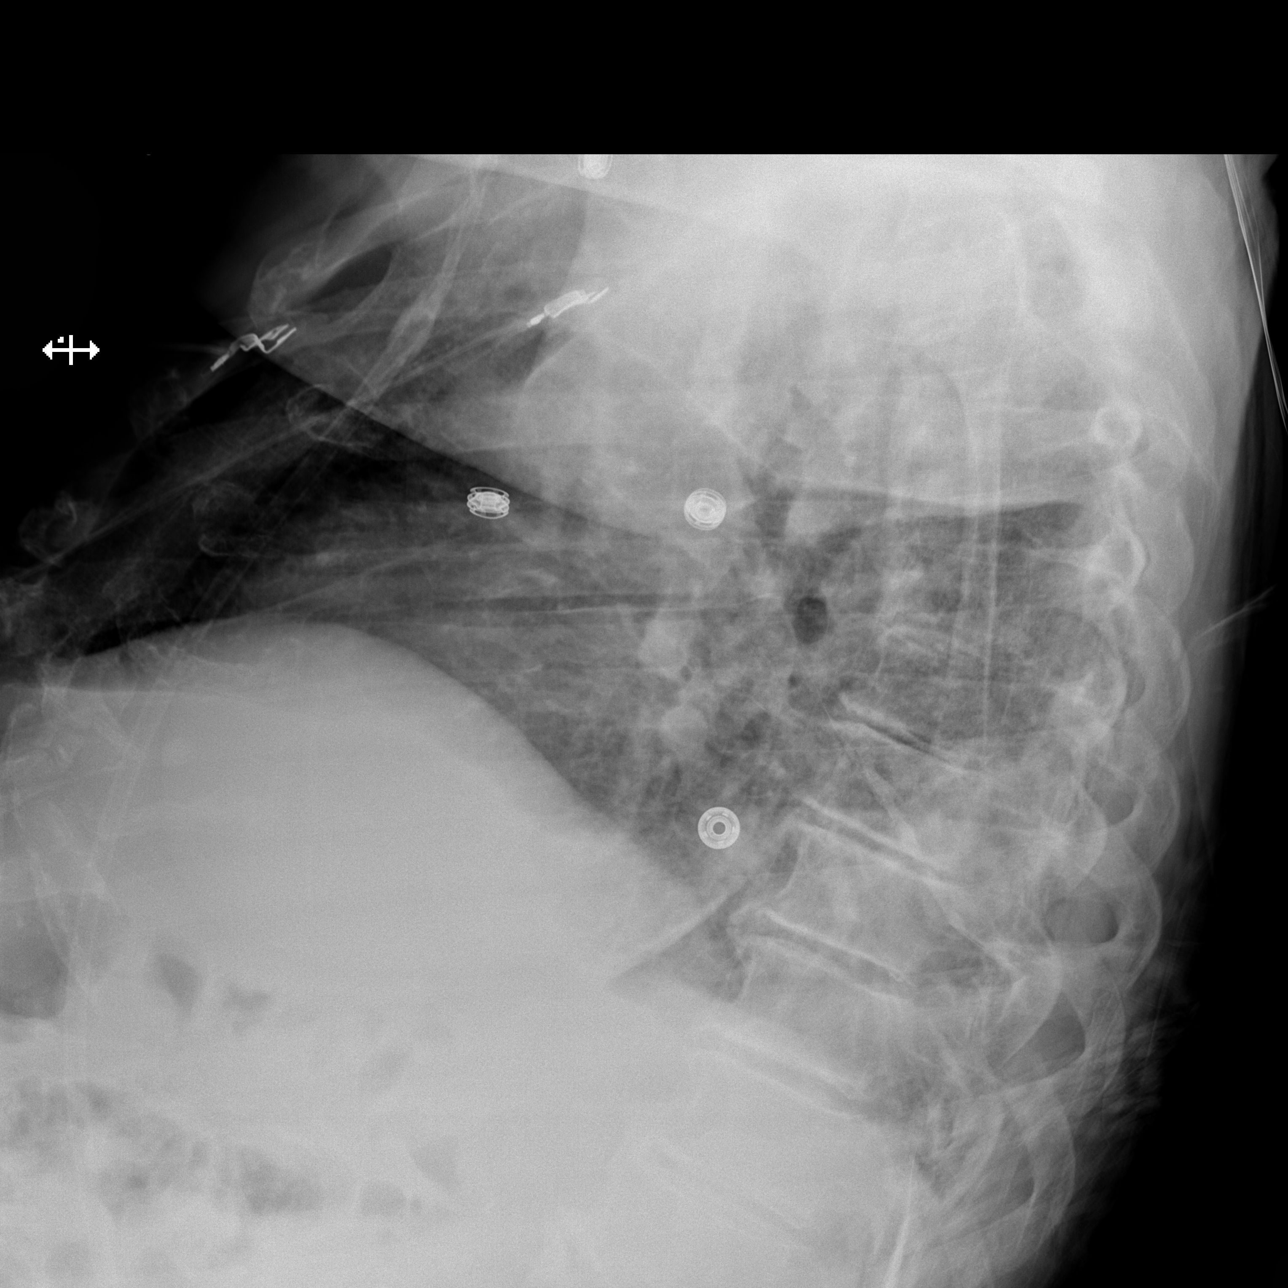

[w chest lat (2 of 2)]
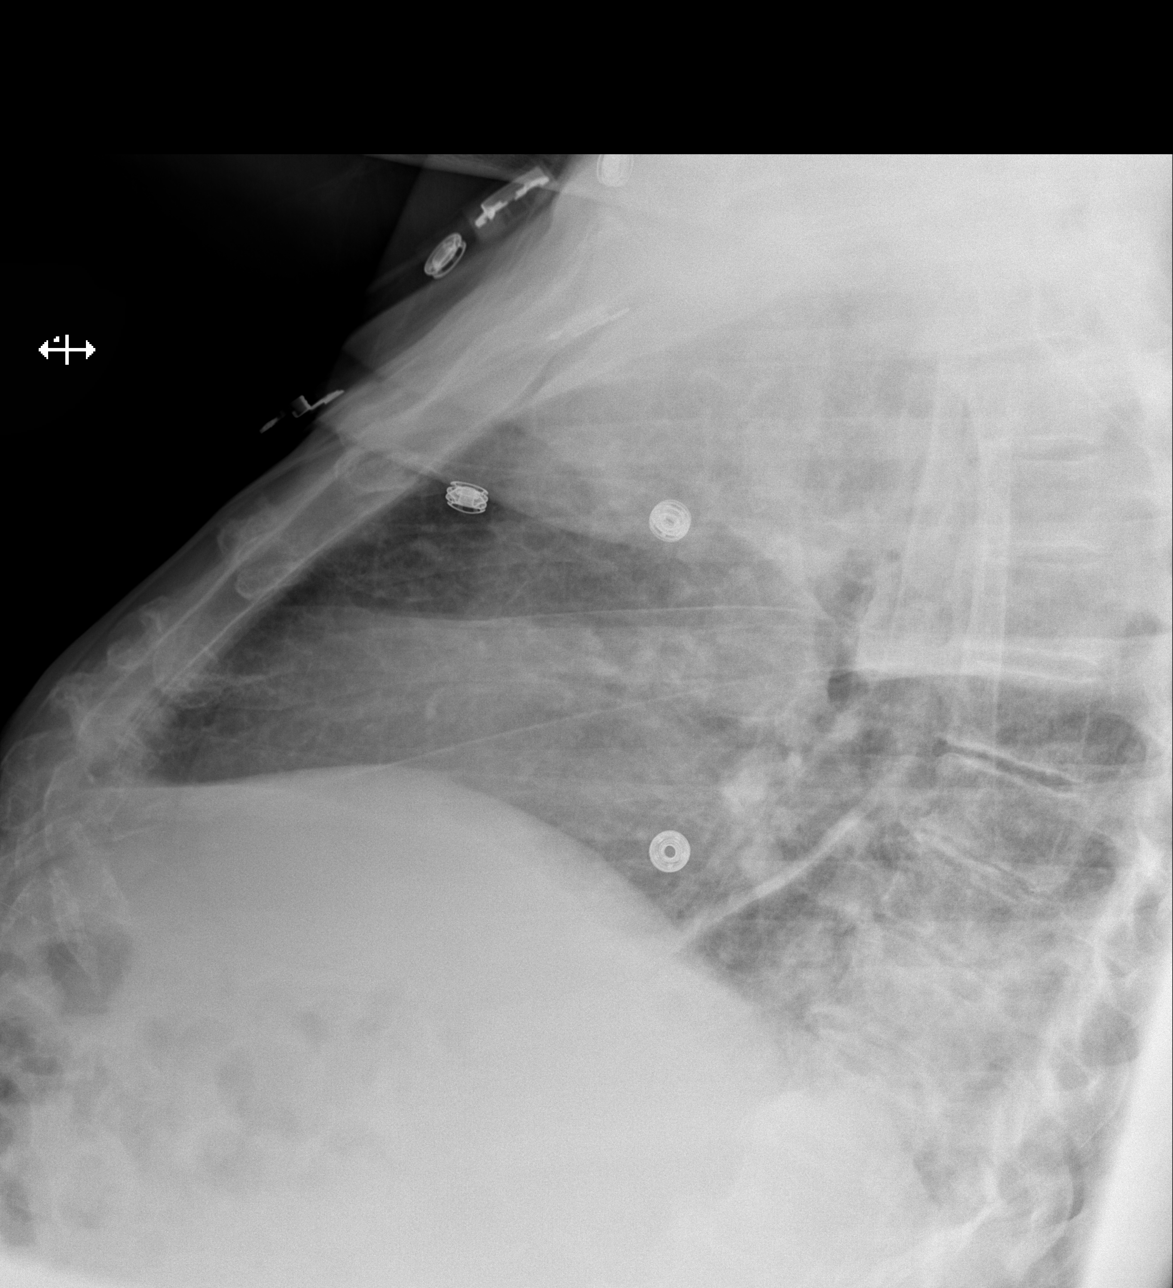

[3 of 3 positions shown; findings below may reference images not displayed]

FINDINGS: The heart is mildly enlarged but stable. Persistent dense right
upper lobe airspace consolidation, likely pneumonia. No recurrent
right pleural effusion. Stable right basilar atelectasis.

Persistent small left effusion and left basilar atelectasis.
IMPRESSION: Stable dense right upper lobe airspace consolidation.

Persistent small left effusion and overlying atelectasis.

## 2019-12-15 IMAGING — US US THORACENTESIS ASP PLEURAL SPACE W/IMG GUIDE
1 series · 5 of 5 positions shown · non-contrast
Comparison: Chest CT-06/01/2018

MEDICATIONS:
None.

COMPLICATIONS:
None immediate.

INDICATION: Symptomatic right sided pleural effusion. Please perform
ultrasound-guided thoracentesis for therapeutic and diagnostic
purposes.

EXAM:
US THORACENTESIS ASP PLEURAL SPACE W/IMG GUIDE
TECHNIQUE: Informed written consent was obtained from the patient after a
discussion of the risks, benefits and alternatives to treatment. A
timeout was performed prior to the initiation of the procedure.

[Series 1: us thoracentesis asp pleural space w/img guide · 0.26mm/px · 5 of 5 slices shown]
[im 1/5]
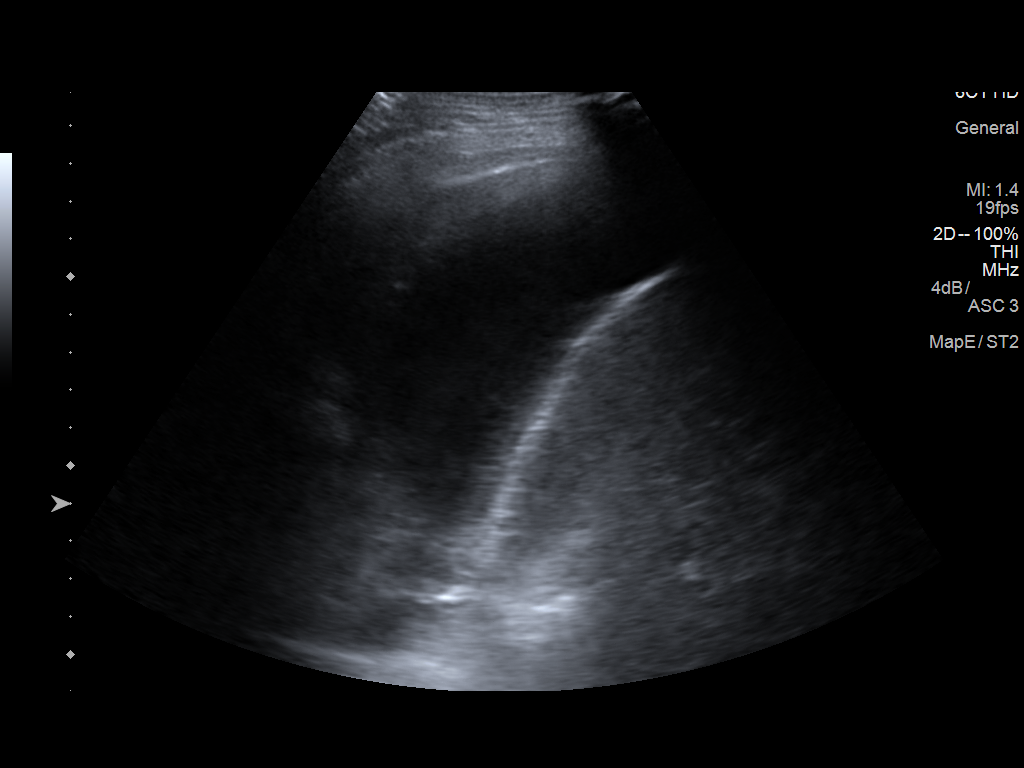
[im 2/5]
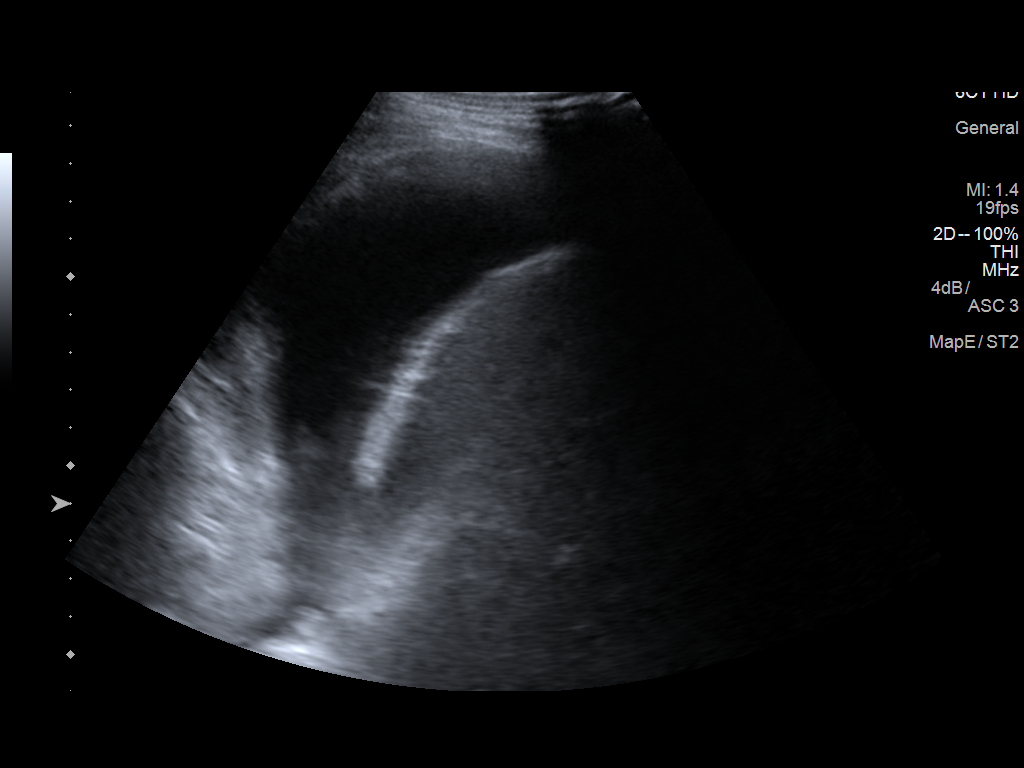
[im 3/5]
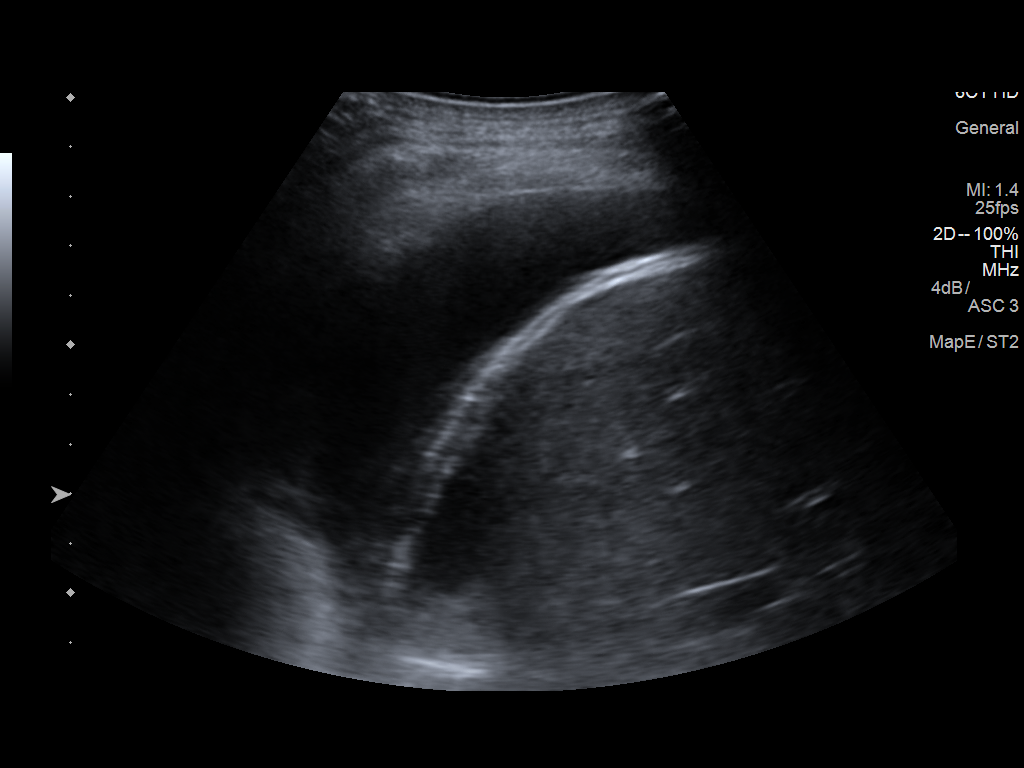
[im 4/5]
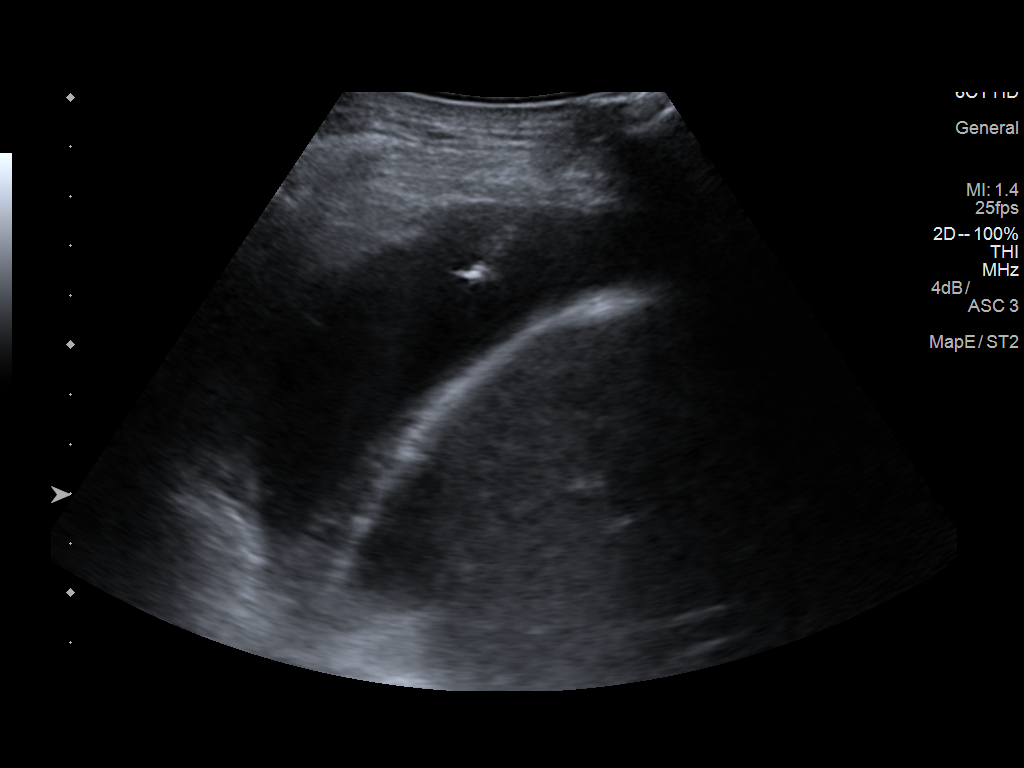
[im 5/5]
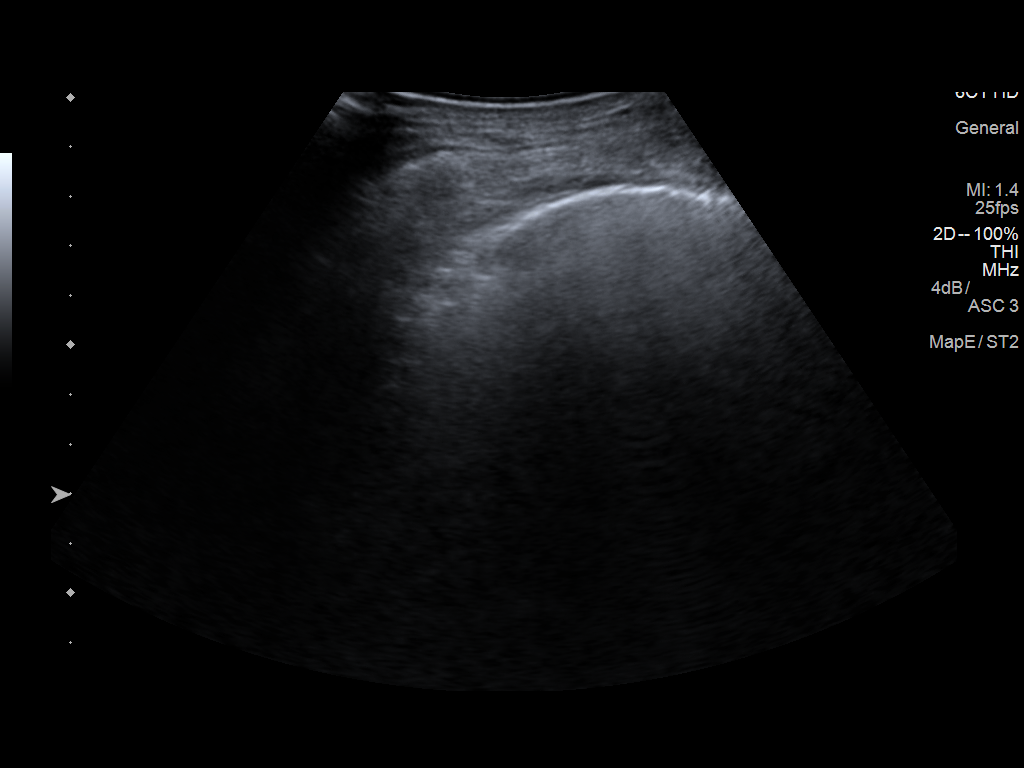

[5 of 5 positions shown; findings below may reference images not displayed]

Initial ultrasound scanning demonstrates a small anechoic
right-sided pleural effusion. The lower chest was prepped and draped
in the usual sterile fashion. 1% lidocaine was used for local
anesthesia. Under direct ultrasound guidance, a 19 gauge, 7-cm, Yueh
catheter was introduced. An ultrasound image was saved for
documentation purposes. The thoracentesis was performed. The
catheter was removed and a dressing was applied. The patient
tolerated the procedure well without immediate post procedural
complication. The patient was escorted to have an upright chest
radiograph.
FINDINGS: A total of approximately 650 cc of serous fluid was removed.
Requested samples were sent to the laboratory.
IMPRESSION: Successful ultrasound-guided right sided thoracentesis yielding 650
cc of pleural fluid.
